# Patient Record
Sex: Female | Born: 1940
Health system: Southern US, Community
[De-identification: ages and names within clinical notes are randomized; demographics above are authoritative.]

## PROBLEM LIST (undated history)

## (undated) DIAGNOSIS — I10 Essential (primary) hypertension: Secondary | ICD-10-CM

## (undated) DIAGNOSIS — R0602 Shortness of breath: Secondary | ICD-10-CM

## (undated) DIAGNOSIS — H3530 Unspecified macular degeneration: Secondary | ICD-10-CM

## (undated) DIAGNOSIS — C3490 Malignant neoplasm of unspecified part of unspecified bronchus or lung: Secondary | ICD-10-CM

## (undated) DIAGNOSIS — E785 Hyperlipidemia, unspecified: Secondary | ICD-10-CM

## (undated) DIAGNOSIS — I639 Cerebral infarction, unspecified: Secondary | ICD-10-CM

## (undated) DIAGNOSIS — J449 Chronic obstructive pulmonary disease, unspecified: Secondary | ICD-10-CM

## (undated) DIAGNOSIS — I2510 Atherosclerotic heart disease of native coronary artery without angina pectoris: Secondary | ICD-10-CM

## (undated) DIAGNOSIS — J189 Pneumonia, unspecified organism: Secondary | ICD-10-CM

## (undated) DIAGNOSIS — R918 Other nonspecific abnormal finding of lung field: Secondary | ICD-10-CM

## (undated) DIAGNOSIS — Z87891 Personal history of nicotine dependence: Secondary | ICD-10-CM

## (undated) HISTORY — DX: Other nonspecific abnormal finding of lung field: R91.8

## (undated) HISTORY — PX: BREAST CYST EXCISION: SHX579

## (undated) HISTORY — DX: Essential (primary) hypertension: I10

## (undated) HISTORY — PX: ABDOMINAL HYSTERECTOMY: SHX81

## (undated) HISTORY — DX: Malignant neoplasm of unspecified part of unspecified bronchus or lung: C34.90

## (undated) HISTORY — PX: TONSILLECTOMY AND ADENOIDECTOMY: SUR1326

## (undated) HISTORY — PX: CATARACT EXTRACTION: SUR2

## (undated) HISTORY — PX: CHOLECYSTECTOMY: SHX55

## (undated) HISTORY — DX: Cerebral infarction, unspecified: I63.9

## (undated) HISTORY — DX: Personal history of nicotine dependence: Z87.891

## (undated) HISTORY — PX: VESICOVAGINAL FISTULA CLOSURE W/ TAH: SUR271

## (undated) HISTORY — PX: BACK SURGERY: SHX140

## (undated) HISTORY — DX: Hyperlipidemia, unspecified: E78.5

---

## 2004-05-07 HISTORY — PX: CARDIAC CATHETERIZATION: SHX172

## 2004-05-14 ENCOUNTER — Ambulatory Visit: Payer: Self-pay | Admitting: Internal Medicine

## 2004-05-19 ENCOUNTER — Ambulatory Visit: Payer: Self-pay | Admitting: Cardiovascular Disease

## 2004-05-19 ENCOUNTER — Inpatient Hospital Stay (HOSPITAL_COMMUNITY): Admission: EM | Admit: 2004-05-19 | Discharge: 2004-05-21 | Payer: Self-pay | Admitting: Emergency Medicine

## 2004-06-12 ENCOUNTER — Ambulatory Visit: Payer: Self-pay | Admitting: Internal Medicine

## 2004-06-16 ENCOUNTER — Ambulatory Visit: Payer: Self-pay | Admitting: Family Medicine

## 2004-06-18 ENCOUNTER — Ambulatory Visit: Payer: Self-pay | Admitting: Internal Medicine

## 2006-01-21 ENCOUNTER — Ambulatory Visit (HOSPITAL_COMMUNITY): Admission: RE | Admit: 2006-01-21 | Discharge: 2006-01-21 | Payer: Self-pay | Admitting: Internal Medicine

## 2006-01-21 ENCOUNTER — Ambulatory Visit: Payer: Self-pay | Admitting: Internal Medicine

## 2006-01-27 ENCOUNTER — Ambulatory Visit: Payer: Self-pay | Admitting: *Deleted

## 2006-03-09 ENCOUNTER — Ambulatory Visit: Payer: Self-pay | Admitting: Family Medicine

## 2006-03-16 ENCOUNTER — Ambulatory Visit: Payer: Self-pay | Admitting: Internal Medicine

## 2006-04-25 ENCOUNTER — Emergency Department (HOSPITAL_COMMUNITY): Admission: EM | Admit: 2006-04-25 | Discharge: 2006-04-25 | Payer: Self-pay | Admitting: Emergency Medicine

## 2006-05-03 ENCOUNTER — Ambulatory Visit: Payer: Self-pay | Admitting: Internal Medicine

## 2006-05-20 ENCOUNTER — Ambulatory Visit: Payer: Self-pay | Admitting: Family Medicine

## 2006-06-22 ENCOUNTER — Ambulatory Visit: Payer: Self-pay | Admitting: Internal Medicine

## 2006-11-05 ENCOUNTER — Ambulatory Visit: Payer: Self-pay | Admitting: Internal Medicine

## 2006-12-22 DIAGNOSIS — I10 Essential (primary) hypertension: Secondary | ICD-10-CM | POA: Insufficient documentation

## 2007-01-10 DIAGNOSIS — I6990 Unspecified sequelae of unspecified cerebrovascular disease: Secondary | ICD-10-CM | POA: Insufficient documentation

## 2007-01-20 DIAGNOSIS — J449 Chronic obstructive pulmonary disease, unspecified: Secondary | ICD-10-CM | POA: Insufficient documentation

## 2007-09-05 ENCOUNTER — Emergency Department (HOSPITAL_COMMUNITY): Admission: EM | Admit: 2007-09-05 | Discharge: 2007-09-05 | Payer: Self-pay | Admitting: Emergency Medicine

## 2007-11-25 ENCOUNTER — Encounter: Admission: RE | Admit: 2007-11-25 | Discharge: 2008-01-16 | Payer: Self-pay | Admitting: Internal Medicine

## 2008-01-23 ENCOUNTER — Ambulatory Visit: Payer: Self-pay | Admitting: Internal Medicine

## 2008-02-01 ENCOUNTER — Ambulatory Visit: Payer: Self-pay | Admitting: Internal Medicine

## 2008-02-08 ENCOUNTER — Ambulatory Visit: Payer: Self-pay

## 2008-03-01 ENCOUNTER — Emergency Department: Payer: Self-pay | Admitting: Unknown Physician Specialty

## 2008-05-02 ENCOUNTER — Ambulatory Visit: Payer: Self-pay | Admitting: Internal Medicine

## 2008-05-04 ENCOUNTER — Encounter: Payer: Self-pay | Admitting: Family Medicine

## 2008-05-10 ENCOUNTER — Encounter: Payer: Self-pay | Admitting: Family Medicine

## 2008-08-02 ENCOUNTER — Encounter: Payer: Self-pay | Admitting: Internal Medicine

## 2008-08-02 ENCOUNTER — Ambulatory Visit: Payer: Self-pay | Admitting: Cardiology

## 2008-08-06 LAB — CONVERTED CEMR LAB
ALT: 10 units/L (ref 0–35)
AST: 10 units/L (ref 0–37)
Albumin: 4.2 g/dL (ref 3.5–5.2)
Alkaline Phosphatase: 77 units/L (ref 39–117)
BUN: 14 mg/dL (ref 6–23)
CO2: 22 meq/L (ref 19–32)
Calcium: 9.1 mg/dL (ref 8.4–10.5)
Chloride: 105 meq/L (ref 96–112)
Cholesterol: 131 mg/dL (ref 0–200)
Creatinine, Ser: 0.85 mg/dL (ref 0.40–1.20)
Glucose, Bld: 135 mg/dL — ABNORMAL HIGH (ref 70–99)
HDL: 43 mg/dL (ref >39)
LDL Cholesterol: 61 mg/dL (ref 0–99)
Potassium: 4.4 meq/L (ref 3.5–5.3)
Sodium: 140 meq/L (ref 135–145)
Total Bilirubin: 0.3 mg/dL (ref 0.3–1.2)
Total CHOL/HDL Ratio: 3
Total Protein: 6.7 g/dL (ref 6.0–8.3)
Triglycerides: 133 mg/dL (ref <150)
VLDL: 27 mg/dL (ref 0–40)

## 2008-09-10 ENCOUNTER — Ambulatory Visit: Payer: Self-pay | Admitting: Family Medicine

## 2009-01-18 ENCOUNTER — Ambulatory Visit: Payer: Self-pay | Admitting: Internal Medicine

## 2009-01-18 DIAGNOSIS — I739 Peripheral vascular disease, unspecified: Secondary | ICD-10-CM | POA: Insufficient documentation

## 2009-01-18 DIAGNOSIS — E785 Hyperlipidemia, unspecified: Secondary | ICD-10-CM

## 2009-01-18 DIAGNOSIS — F17200 Nicotine dependence, unspecified, uncomplicated: Secondary | ICD-10-CM | POA: Insufficient documentation

## 2009-01-18 DIAGNOSIS — M79609 Pain in unspecified limb: Secondary | ICD-10-CM | POA: Insufficient documentation

## 2009-01-18 DIAGNOSIS — I10 Essential (primary) hypertension: Secondary | ICD-10-CM | POA: Insufficient documentation

## 2009-01-21 ENCOUNTER — Encounter: Payer: Self-pay | Admitting: Internal Medicine

## 2009-01-23 ENCOUNTER — Encounter: Payer: Self-pay | Admitting: Internal Medicine

## 2009-04-01 DIAGNOSIS — E785 Hyperlipidemia, unspecified: Secondary | ICD-10-CM | POA: Insufficient documentation

## 2009-04-01 DIAGNOSIS — R5381 Other malaise: Secondary | ICD-10-CM | POA: Insufficient documentation

## 2009-07-22 ENCOUNTER — Ambulatory Visit: Payer: Self-pay | Admitting: Internal Medicine

## 2009-07-22 DIAGNOSIS — R079 Chest pain, unspecified: Secondary | ICD-10-CM | POA: Insufficient documentation

## 2009-07-23 ENCOUNTER — Encounter: Payer: Self-pay | Admitting: Internal Medicine

## 2009-08-06 ENCOUNTER — Telehealth (INDEPENDENT_AMBULATORY_CARE_PROVIDER_SITE_OTHER): Payer: Self-pay | Admitting: *Deleted

## 2009-08-07 ENCOUNTER — Ambulatory Visit: Payer: Self-pay | Admitting: Cardiovascular Disease

## 2009-08-07 ENCOUNTER — Encounter (HOSPITAL_COMMUNITY): Admission: RE | Admit: 2009-08-07 | Discharge: 2009-10-04 | Payer: Self-pay | Admitting: Internal Medicine

## 2009-08-07 ENCOUNTER — Ambulatory Visit: Payer: Self-pay

## 2009-08-12 ENCOUNTER — Telehealth: Payer: Self-pay | Admitting: Internal Medicine

## 2009-12-31 ENCOUNTER — Ambulatory Visit: Payer: Self-pay | Admitting: Family Medicine

## 2010-02-04 ENCOUNTER — Encounter: Payer: Self-pay | Admitting: Internal Medicine

## 2010-02-13 ENCOUNTER — Ambulatory Visit: Payer: Self-pay | Admitting: Internal Medicine

## 2010-02-14 ENCOUNTER — Ambulatory Visit: Payer: Self-pay | Admitting: Pulmonary Disease

## 2010-02-14 DIAGNOSIS — R05 Cough: Secondary | ICD-10-CM

## 2010-02-14 DIAGNOSIS — R059 Cough, unspecified: Secondary | ICD-10-CM | POA: Insufficient documentation

## 2010-03-07 ENCOUNTER — Ambulatory Visit: Payer: Self-pay | Admitting: Pulmonary Disease

## 2010-03-07 DIAGNOSIS — J439 Emphysema, unspecified: Secondary | ICD-10-CM | POA: Insufficient documentation

## 2010-05-06 NOTE — Progress Notes (Signed)
Summary: Nuclear Pre-Procedure  Phone Note Outgoing Call   Call placed by: Perrin Maltese, EMT-P,  Aug 06, 2009 2:45 PM Summary of Call: Reviewed information on Myoview Information Sheet (see scanned document for further details).  Patsy spoke with the patient on 08/01/09.     Nuclear Med Background Indications for Stress Test: Evaluation for Ischemia   History: COPD, Heart Catheterization  History Comments: '06 Heart Cath N/O CAD EF 60%  Symptoms: Chest Pain, Dizziness, DOE, Fatigue    Nuclear Pre-Procedure Cardiac Risk Factors: CVA, Family History - CAD, Hypertension, Lipids, NIDDM, Smoker Height (in): 72  Nuclear Med Study Referring MD:  D.Bensimhon

## 2010-05-06 NOTE — Assessment & Plan Note (Signed)
Summary: F6M/AMD   Visit Type:  Follow-up Primary Provider:  Stacey Drain, MD  CC:  c/o shortness of breath and occas. chest pain.Marland Kitchen  History of Present Illness: [name] [name] is a very pleasant 70 year old woman with a history of chest pain with minimal nonobstructive coronary artery disease by catheterization on May 08, 2004, COPD with ongoing tobacco use, stroke, hypertension, diabetes, CVA and hyperlipidemia. She returns today for routine followup.   At last visit was having chest pressure so ordered Myoview. EF 67% no ischemia or infarct.  For past 3 months has been having severe cough. Dr. Rosanna Randy treating with several rounds of prednisone. Still smoking but down to a pack per week. No f/c. +wheezing. Occasional chest pain with coughing. No PND.    Current Medications (verified): 1)  Lisinopril-Hydrochlorothiazide 20-25 Mg Tabs (Lisinopril-Hydrochlorothiazide) .Marland Kitchen.. 1 Tab By Mouth Daily 2)  Klor-Con M20 20 Meq Cr-Tabs (Potassium Chloride Crys Cr) .Marland Kitchen.. 1 Tab By Mouth Daily 3)  Amlodipine Besylate 10 Mg Tabs (Amlodipine Besylate) .... Take One Tablet By Mouth Daily 4)  Aggrenox 25-200 Mg Xr12h-Cap (Aspirin-Dipyridamole) .... Take One Capsule By Mouth Twice A Day 5)  Metformin Hcl 1000 Mg Tabs (Metformin Hcl) .Marland Kitchen.. 1 Tab By Mouth Daily 6)  Proair Hfa 108 (90 Base) Mcg/act Aers (Albuterol Sulfate) .Marland Kitchen.. 1 Puff Every 3 Hours As Needed 7)  Aspirin Ec 325 Mg Tbec (Aspirin) .... Take One Tablet By Mouth Daily 8)  Vitamin D (Ergocalciferol) 50000 Unit Caps (Ergocalciferol) .Marland Kitchen.. 1 Tab Weekly 9)  Preservision/lutein  Caps (Multiple Vitamins-Minerals) .Marland Kitchen.. 1 Tab By Mouth Twice Daily 10)  Pravastatin Sodium 40 Mg Tabs (Pravastatin Sodium) .... One Tablet At Bedtime 11)  Prednisone Taper .... Six Tablets Daily For Four Days and Then Taper  Allergies (verified): No Known Drug Allergies  Past History:  Past Medical History: Last updated: 02-10-2009 MINIMAL NONOBSTRUCTIVE CAD: CATH ON FEB  2006 TOBACCO USAGE RECENT STROKE  HYPERTENSION DIABETES HYPERLIPIDEMIA: FOLLOWED BY DR Rosanna Randy COPD  Past Surgical History: Last updated: 02/10/09 GALLBLADDER HYSTERECTOMY BACK SURGERY  Family History: Last updated: 02/10/09 MOTHER: DECEASED 75; MASSIVE STROKE FATHER: DECEASED 65; MASSIVE HEART ATTACK BROTHER: HEART ATTACK BROTHER: HEART ATTACK   Social History: Last updated: 02-10-2009 Retired  Married  Tobacco Use - Yes.  Alcohol Use - no Regular Exercise - yes Drug Use - no  Risk Factors: Exercise: yes (Feb 10, 2009)  Risk Factors: Smoking Status: current (2009-02-10)  Vital Signs:  Patient profile:   70 year old female Height:      63 inches Weight:      154.25 pounds BMI:     27.42 Pulse rate:   75 / minute BP sitting:   124 / 70  (left arm) Cuff size:   regular  Vitals Entered By: Dolores Lory, CMA (February 13, 2010 11:09 AM)  Physical Exam  General:  Elderly. Severe hacking cough.  Hall walk sats 97% to 94% HEENT: normal Neck: supple. no JVD. Carotids 2+ bilat; no bruits. No lymphadenopathy or thryomegaly appreciated. Cor: PMI nondisplaced. Regular rate & rhythm. No rubs, murmur. +s4 Lungs: decreased airmovement. coarse. severe cough. no wheeze. Abdomen: soft, nontender, nondistended. No hepatosplenomegaly. No bruits or masses. Good bowel sounds. Extremities: no cyanosis, clubbing, rash, edema  Dp 1+ L nonpalpabe R Neuro: alert & orientedx3, cranial nerves grossly intact. moves all 4 extremities w/o difficulty. affect pleasant    Impression & Recommendations:  Problem # 1:  COUGH (ICD-786.2) Quite severe. Will refer to pulmonary for further evalaution and optimization of her COPD  regimen.   Problem # 2:  TOBACCO ABUSE (ICD-305.1) Counseled on need to stop smoking.   Problem # 3:  HYPERTENSION, BENIGN (ICD-401.1) Blood pressure well controlled. Continue current regimen.  Problem # 4:  CHEST TIGHTNESS-PRESSURE-OTHER  [id number]) Resolved. Myoview reassuring.   Other Orders: EKG w/ Interpretation (93000)  Patient Instructions: 1)  You have been referred to Dr Danton Sewer at [facility] [place], [place], [place] across from [facility].  Appointment made for 02/14/10 at 11:30, please arrive 99mins early to register.  2)  Your physician recommends that you schedule a follow-up appointment in: 6 months

## 2010-05-06 NOTE — Miscellaneous (Signed)
Summary: Orders Update pft charges  Clinical Lists Changes  Orders: Added new Service order of Carbon Monoxide diffusing w/capacity (94720) - Signed Added new Service order of Lung Volumes (94240) - Signed Added new Service order of Spirometry (Pre & Post) (94060) - Signed 

## 2010-05-06 NOTE — Assessment & Plan Note (Signed)
Summary: Cardiology Nuclear Study  Nuclear Med Background Indications for Stress Test: Evaluation for Ischemia   History: COPD, GXT, Heart Catheterization  History Comments: >15 yrs ago GXT:OK per patient; '06Cath: n/o CAD, EF= 60%  Symptoms: Chest Pain, Diaphoresis, Dizziness, DOE, Fatigue  Symptoms Comments: CP>left arm. Last episode of [id number] week.   Nuclear Pre-Procedure Cardiac Risk Factors: CVA, Family History - CAD, Hypertension, Lipids, NIDDM, Smoker Caffeine/Decaff Intake: None NPO After: 6:00 PM Lungs: Clear.  O2 Sat 98% with deep breathing. IV 0.9% NS with Angio Cath: 24g     IV Site: (R) Hand IV Started by: Irven Baltimore RN Chest Size (in) 36     Cup Size B     Height (in): 63 Weight (lb): 153 BMI: 27.20 Tech Comments: Held aggrenox x 84 hours  Nuclear Med Study 1 or 2 day study:  1 day     Stress Test Type:  Carlton Adam Reading MD:  Jenkins Rouge, MD     Referring MD:  Glori Bickers, MD Resting Radionuclide:  Technetium 3m Tetrofosmin     Resting Radionuclide Dose:  11 mCi  Stress Radionuclide:  Technetium 5m Tetrofosmin     Stress Radionuclide Dose:  33 mCi   Stress Protocol   Lexiscan: 0.4 mg   Stress Test Technologist:  Valetta Fuller CMA-N     Nuclear Technologist:  Charlton Amor CNMT  Rest Procedure  Myocardial perfusion imaging was performed at rest 45 minutes following the intravenous administration of Myoview Technetium 66m Tetrofosmin.  Stress Procedure  The patient received IV Lexiscan 0.4 mg over 15-seconds.  Myoview injected at 30-seconds.  There were no significant changes with lexiscan.  She did c/o chest pressure.  She also had slight difficultly breathing in recovery with minimal inspiratory wheezes; albuterol inhaler used with relief.  Quantitative spect images were obtained after a 45 minute delay.  QPS Raw Data Images:  Normal; no motion artifact; normal heart/lung ratio. Stress Images:  NI: Uniform and normal uptake of tracer in all  myocardial segments. Rest Images:  Normal homogeneous uptake in all areas of the myocardium. Subtraction (SDS):  Normal Transient Ischemic Dilatation:  1.12  (Normal <1.22)  Lung/Heart Ratio:  .31  (Normal <0.45)  Quantitative Gated Spect Images QGS EDV:  68 ml QGS ESV:  23 ml QGS EF:  67 % QGS cine images:  normal  Findings Normal nuclear study      Overall Impression  Exercise Capacity: Lexiscan BP Response: Normal blood pressure response. Clinical Symptoms: Headache ECG Impression: No significant ST segment change suggestive of ischemia. Overall Impression: Normal stress nuclear study. Overall Impression Comments: Mild breast attenuation  Appended Document: Cardiology Nuclear Study ok  Appended Document: Cardiology Nuclear Study Spoke with pt, advised normal stress nuclear study.  EWJ

## 2010-05-06 NOTE — Assessment & Plan Note (Signed)
Summary: F6M/AMD   Primary Provider:  Stacey Drain, MD  CC:  Chest discomfort, dizziness, and & SOB 2 days ago.  History of Present Illness: [name] [name] is a very pleasant 70 year old woman with a history of chest pain with minimal nonobstructive coronary artery disease by catheterization on May 08, 2004, COPD with ongoing tobacco use, stroke, hypertension, diabetes, and hyperlipidemia. She returns today for routine followup.   Doing fairly well. Continues to smoke 2-5 cigs per day and have some exertional SOB. No HF symptoms. Did have 1 episode of CP while standing at sink up L arm into chest lasted 5-10 mins last week. after the CP felt wiped out all night. Has not recurred. Is taking all meds. Occasional dizziness. No syncope or presyncope. Has recovered well from her CVA.   New Orders:     1)  Nuclear Stress Test (Nuc Stress Test)   Current Medications (verified): 1)  Lisinopril-Hydrochlorothiazide 20-25 Mg Tabs (Lisinopril-Hydrochlorothiazide) .Marland Kitchen.. 1 Tab By Mouth Daily 2)  Crestor 20 Mg Tabs (Rosuvastatin Calcium) .... Take One Tablet By Mouth Daily. 3)  Klor-Con M20 20 Meq Cr-Tabs (Potassium Chloride Crys Cr) .Marland Kitchen.. 1 Tab By Mouth Daily 4)  Amlodipine Besylate 10 Mg Tabs (Amlodipine Besylate) .... Take One Tablet By Mouth Daily 5)  Aggrenox 25-200 Mg Xr12h-Cap (Aspirin-Dipyridamole) .... Take One Capsule By Mouth Twice A Day 6)  Metformin Hcl 1000 Mg Tabs (Metformin Hcl) .Marland Kitchen.. 1 Tab By Mouth Daily 7)  Proair Hfa 108 (90 Base) Mcg/act Aers (Albuterol Sulfate) .Marland Kitchen.. 1 Puff Every 3 Hours As Needed 8)  Aspirin Ec 325 Mg Tbec (Aspirin) .... Take One Tablet By Mouth Daily 9)  Vitamin D (Ergocalciferol) 50000 Unit Caps (Ergocalciferol) .Marland Kitchen.. 1 Tab Weekly 10)  Preservision/lutein  Caps (Multiple Vitamins-Minerals) .Marland Kitchen.. 1 Tab By Mouth Twice Daily  Allergies (verified): No Known Drug Allergies  Past History:  Past Medical History: Last updated: 01/18/2009 MINIMAL NONOBSTRUCTIVE CAD: CATH  ON FEB 2006 TOBACCO USAGE RECENT STROKE  HYPERTENSION DIABETES HYPERLIPIDEMIA: FOLLOWED BY DR Rosanna Randy COPD  Review of Systems       As per HPI and past medical history; otherwise all systems negative.   Vital Signs:  Patient profile:   70 year old female Height:      63 inches Weight:      155 pounds BMI:     27.56 Pulse rate:   69 / minute BP sitting:   142 / 74  (left arm)  Vitals Entered By: Eliezer Lofts, EMT-P (July 22, 2009 10:36 AM)  Physical Exam  General:  Elderly. No acure distress. no resp difficulty HEENT: normal Neck: supple. no JVD. Carotids 2+ bilat; no bruits. No lymphadenopathy or thryomegaly appreciated. Cor: PMI nondisplaced. Regular rate & rhythm. No rubs, murmur. +s4 Lungs: clear with decreased airmovement Abdomen: soft, nontender, nondistended. No hepatosplenomegaly. No bruits or masses. Good bowel sounds. Extremities: no cyanosis, clubbing, rash, edema  Dp 1+ L nonpalpabe R Neuro: alert & orientedx3, cranial nerves grossly intact. moves all 4 extremities w/o difficulty. affect pleasant    Impression & Recommendations:  Problem # 1:  CHEST TIGHTNESS-PRESSURE-OTHER [id number]) Given RFs will plan Lexiscan Myoview to evaluate. (doubt she can reach target HR on treadmill so will do pharmaceutical stress).   Problem # 2:  TOBACCO ABUSE (ICD-305.1) Counseled on need for smoking cessation.   Other Orders: Nuclear Stress Test (Nuc Stress Test)

## 2010-05-06 NOTE — Progress Notes (Signed)
Summary: stress test results  Phone Note Call from Patient Call back at Home Phone [phone]   Caller: Patient Reason for Call: Lab or Test Results Summary of Call: request results of stress test Initial call taken by: Darnell Level,  Aug 12, 2009 11:25 AM  Follow-up for Phone Call        Called spoke with pt.  Results given see Nuclear append in EMR. Follow-up by: Freddrick March RN,  Aug 12, 2009 2:41 PM

## 2010-05-06 NOTE — Assessment & Plan Note (Signed)
Summary: consult for chronic cough   Visit Type:  Initial Consult Copy to:  Glori Bickers MD Primary Provider/Referring Provider:  Stacey Drain, MD  CC:  pulmonary consult. pt c/o dry cough x 11/2009. pt states she gets a hacking cough every year. Marland Kitchen  History of Present Illness: The pt is a 70y/o female who I have been asked to see for cough.  It started in Sept, with no viral URI prodrome or chest infection.  It is dry in nature, and often has cough paroxysms.  She has been on emperic abx, and now on a steroid taper.  She does not feel it has helped.  The cough worsens with prolonged conversation, and she admits to having a globus sensation in her throat.  She denies excessive throat clearing.  She denies postnasal drip, and also GERD symptoms.  She is currently on an ACE inhibitor, and also is still smoking.  She has not had a recent cxr, and has never had pfts.  Preventive Screening-Counseling & Management  Alcohol-Tobacco     Smoking Status: current     Smoking Cessation Counseling: yes     Packs/Day: 0.5     Tobacco Counseling: to quit use of tobacco products  Current Medications (verified): 1)  Lisinopril-Hydrochlorothiazide 20-25 Mg Tabs (Lisinopril-Hydrochlorothiazide) .Marland Kitchen.. 1 Tab By Mouth Daily 2)  Klor-Con M20 20 Meq Cr-Tabs (Potassium Chloride Crys Cr) .Marland Kitchen.. 1 Tab By Mouth Daily 3)  Amlodipine Besylate 10 Mg Tabs (Amlodipine Besylate) .... Take One Tablet By Mouth Daily 4)  Aggrenox 25-200 Mg Xr12h-Cap (Aspirin-Dipyridamole) .... Take One Capsule By Mouth Twice A Day 5)  Metformin Hcl 1000 Mg Tabs (Metformin Hcl) .Marland Kitchen.. 1 Tab By Mouth Daily 6)  Proair Hfa 108 (90 Base) Mcg/act Aers (Albuterol Sulfate) .Marland Kitchen.. 1 Puff Every 3 Hours As Needed 7)  Aspirin Ec 325 Mg Tbec (Aspirin) .... Take One Tablet By Mouth Daily 8)  Vitamin D (Ergocalciferol) 50000 Unit Caps (Ergocalciferol) .Marland Kitchen.. 1 Tab Weekly 9)  Preservision/lutein  Caps (Multiple Vitamins-Minerals) .Marland Kitchen.. 1 Tab By Mouth Twice  Daily 10)  Pravastatin Sodium 40 Mg Tabs (Pravastatin Sodium) .... One Tablet At Bedtime 11)  Prednisone Taper .... Six Tablets Daily For Four Days and Then Taper  Allergies (verified): No Known Drug Allergies  Past History:  Past Medical History: MINIMAL NONOBSTRUCTIVE CAD: CATH ON FEB 2006 RECENT STROKE  HYPERTENSION DIABETES HYPERLIPIDEMIA: FOLLOWED BY DR Rosanna Randy  Past Surgical History: Reviewed history from 01/18/2009 and no changes required. GALLBLADDER HYSTERECTOMY BACK SURGERY  Family History: Reviewed history from 01/18/2009 and no changes required. MOTHER: DECEASED 75; MASSIVE STROKE FATHER: DECEASED 65; MASSIVE HEART ATTACK BROTHER: HEART ATTACK BROTHER: HEART ATTACK  lung cancer--mgf MI--pgm  Social History: Reviewed history from 01/18/2009 and no changes required. Retired  [employer] Married  Tobacco Use - Yes.  Alcohol Use - no Regular Exercise - yes Drug Use - no children--4 Patient is a current smoker. 2 packs per week. started age 32.  Packs/Day:  0.5  Review of Systems       The patient complains of shortness of breath with activity, shortness of breath at rest, non-productive cough, and nasal congestion/difficulty breathing through nose.  The patient denies productive cough, coughing up blood, chest pain, irregular heartbeats, acid heartburn, indigestion, loss of appetite, weight change, abdominal pain, difficulty swallowing, sore throat, tooth/dental problems, headaches, sneezing, itching, ear ache, anxiety, depression, hand/feet swelling, joint stiffness or pain, rash, change in color of mucus, and fever.    Vital Signs:  Patient  profile:   70 year old female Height:      63 inches Weight:      155.13 pounds O2 Sat:      97 % on Room air Temp:     97.9 degrees F oral Pulse rate:   67 / minute BP sitting:   136 / 80  (left arm) Cuff size:   regular  Vitals Entered By: Charma Igo (February 14, 2010 11:44 AM)  O2 Flow:  Room air CC:  pulmonary consult. pt c/o dry cough x 11/2009. pt states she gets a hacking cough every year.  Comments meds and allergies updated Phone number updated Charma Igo  February 14, 2010 11:44 AM    Physical Exam  General:  wd female in nad Eyes:  PERRLA and EOMI.   Nose:  patent without discharge, no purulence seen Mouth:  no erythema or exudates Neck:  no jvd, tmg, LN Lungs:  clear to auscultation, no wheezing or rhonchi Heart:  rrr, no mrg Abdomen:  soft and nontender, bs+ Extremities:  mild pedal edema, no cyanosis  pulses intact distally Neurologic:  alert and oriented, moves all 4.   Impression & Recommendations:  Problem # 1:  COUGH (ICD-786.2) the pt has a cough that I suspect is multifactorial.  I think this is primarily upper airway in origin, and related to her smoking, ACE, and cyclical cough mechanism.  Will check a cxr today for completeness, and will check spirometry at next visit if she is not coughing continuously.  At this point, will get her off ACE, have asked her to stop smoking, and will place her on a modified cyclical cough protocol with behavioral therapy and modest cough suppression.  Medications Added to Medication List This Visit: 1)  Tessalon Perles 100 Mg Caps (Benzonatate) .... Two by mouth every 6 hrs as needed for cough  Other Orders: Consultation Level IV [id number]) Tobacco use cessation intermediate 3-10 minutes [id number]) Pulmonary Referral (Pulmonary) T-2 View CXR (71020TC)  Patient Instructions: 1)  stop lisinopril, and start diovan 160/25mg  one each am. 2)  stop smoking  3)  try and limit voice use as much as possible, and no throat clearing 4)  use hard candy (no peppermint or menthol) to bathe the back of the throat.  Keep in your mouth all day during waking hours. 5)  tessalon pearls 100mg  2 pearls up to every 6 hrs if needed. 6)  will check cxr today, and will call you with the results 7)  followup with me in 3 weeks, and will check breathing  tests the same day. Prescriptions: TESSALON PERLES 100 MG  CAPS (BENZONATATE) two by mouth every 6 hrs as needed for cough  #30 x 1   Entered and Authorized by:   Kathee Delton MD   Signed by:   Kathee Delton MD on 02/14/2010   Method used:   Print then Give to Patient   RxID:   [id number]      Immunization History:  Influenza Immunization History:    Influenza:  historical (12/05/2009)  Pneumovax Immunization History:    Pneumovax:  historical (01/05/2008)

## 2010-05-08 NOTE — Assessment & Plan Note (Signed)
Summary: rov for copd   Visit Type:  Follow-up Copy to:  Glori Bickers MD Primary Provider/Referring Provider:  Stacey Drain, MD  CC:  pt here to pft results. pt currently has a cold. pt c/o dry cough, wheezing, and sob when coughing. pt states she has had 1 cig on Monday. Marland Kitchen  History of Present Illness: The pt comes in today for f/u of her cough and to review pfts.  At the last visit, a lot of her cough was felt to be upper airway in origin, and possibly related to ACE.  Her lisinopril was d/ced, and the pt was asked to stop smoking.  She tells me her cough totally resolved, but now has developed a "chest cold".  She is not congested or bringing up purulence.  Her pfts today show moderate obstruction, definite airtrapping, and a normal DLCO.  I have reviewed the study with her in detail, and answered all of her questions.  Current Medications (verified): 1)  Diovan Hct 160-25 Mg Tabs (Valsartan-Hydrochlorothiazide) .... Once Daily 2)  Klor-Con M20 20 Meq Cr-Tabs (Potassium Chloride Crys Cr) .Marland Kitchen.. 1 Tab By Mouth Daily 3)  Amlodipine Besylate 10 Mg Tabs (Amlodipine Besylate) .... Take One Tablet By Mouth Daily 4)  Aggrenox 25-200 Mg Xr12h-Cap (Aspirin-Dipyridamole) .... Take One Capsule By Mouth Twice A Day 5)  Metformin Hcl 1000 Mg Tabs (Metformin Hcl) .Marland Kitchen.. 1 Tab By Mouth Daily 6)  Proair Hfa 108 (90 Base) Mcg/act Aers (Albuterol Sulfate) .Marland Kitchen.. 1 Puff Every 3 Hours As Needed 7)  Aspirin Ec 325 Mg Tbec (Aspirin) .... Take One Tablet By Mouth Daily 8)  Preservision/lutein  Caps (Multiple Vitamins-Minerals) .Marland Kitchen.. 1 Tab By Mouth Twice Daily 9)  Pravastatin Sodium 40 Mg Tabs (Pravastatin Sodium) .... One Tablet At Bedtime 10)  Tessalon Perles 100 Mg  Caps (Benzonatate) .... Two By Mouth Every 6 Hrs As Needed For Cough  Allergies (verified): No Known Drug Allergies  Past History:  Past medical, surgical, family and social histories (including risk factors) reviewed, and no changes noted (except  as noted below).  Past Medical History: Reviewed history from 02/14/2010 and no changes required. MINIMAL NONOBSTRUCTIVE CAD: CATH ON FEB 2006 RECENT STROKE  HYPERTENSION DIABETES HYPERLIPIDEMIA: FOLLOWED BY DR Rosanna Randy  Past Surgical History: Reviewed history from 01/18/2009 and no changes required. GALLBLADDER HYSTERECTOMY BACK SURGERY  Family History: Reviewed history from 02/14/2010 and no changes required. MOTHER: DECEASED 75; MASSIVE STROKE FATHER: DECEASED 65; MASSIVE HEART ATTACK BROTHER: HEART ATTACK BROTHER: HEART ATTACK  lung cancer--mgf MI--pgm  Social History: Reviewed history from 02/14/2010 and no changes required. Retired  [employer] Married  Tobacco Use - Yes.  Alcohol Use - no Regular Exercise - yes Drug Use - no children--4 Patient is a current smoker. 2 packs per week. started age 89.   Review of Systems       The patient complains of shortness of breath with activity, shortness of breath at rest, non-productive cough, loss of appetite, weight change, sore throat, nasal congestion/difficulty breathing through nose, and sneezing.  The patient denies coughing up blood, chest pain, irregular heartbeats, acid heartburn, indigestion, abdominal pain, difficulty swallowing, tooth/dental problems, headaches, itching, ear ache, anxiety, depression, hand/feet swelling, rash, change in color of mucus, and fever.    Vital Signs:  Patient profile:   70 year old female Height:      63 inches Weight:      152 pounds BMI:     27.02 O2 Sat:      95 %  on Room air Temp:     97.6 degrees F oral Pulse rate:   89 / minute BP sitting:   98 / 58  (left arm) Cuff size:   regular  Vitals Entered By: Charma Igo (March 07, 2010 10:47 AM)  O2 Flow:  Room air CC: pt here to pft results. pt currently has a cold. pt c/o dry cough, wheezing, sob when coughing. pt states she has had 1 cig on Monday.  Comments meds and allergies updated Phone number updated Charma Igo  March 07, 2010 10:47 AM    Physical Exam  General:  ow female in nad Nose:  no purulence or drainage noted Mouth:  no exudates or erythema Lungs:  decreased bs , but no wheezing Heart:  rrr Extremities:  no edema or cyanosis Neurologic:  alert and oriented, moves all 4.   Impression & Recommendations:  Problem # 1:  COPD ([id number]) the pt has moderate airflow obstruction on her pfts, but it is unclear how much of this is reversible with smoking cessation and resolution of her "cold".  Will go ahead and start her on a bronchodilator regimen, but also stressed to her the importance of stopping smoking.  Problem # 2:  COUGH (ICD-786.2) Her cough totally resolved with d/c ACE, but now she has a URI/chest cold.  I have discussed with her a quick prednisone taper to help her thru this, but she is concerned about driving up her blood sugars.  She would like to avoid.  Will see if the ICS can take care of this.  I also stressed to her the role of smoking with her cough.  Medications Added to Medication List This Visit: 1)  Diovan Hct 160-25 Mg Tabs (Valsartan-hydrochlorothiazide) .... Once daily 2)  Symbicort 160-4.5 Mcg/act Aero (Budesonide-formoterol fumarate) .... Two puffs twice daily  Other Orders: Est. Patient Level IV [id number]  Patient Instructions: 1)  will try on symbicort 160/4.5  2 inhalations am and pm everyday...rinse mouth well 2)  albuterol inhaler ( proair) 2 puffs up to every 6hrs if needed for emergencies. 3)  stay off lisinopril, and discuss with your primary if diovan is ok 4)  let me know if your chest cold doesn't clear up 5)  followup with me in 3 mos.  Prescriptions: SYMBICORT 160-4.5 MCG/ACT  AERO (BUDESONIDE-FORMOTEROL FUMARATE) Two puffs twice daily  #1 x 6   Entered and Authorized by:   Kathee Delton MD   Signed by:   Kathee Delton MD on 03/07/2010   Method used:   Print then Give to Patient   RxID:   [id number]

## 2010-06-05 ENCOUNTER — Encounter: Payer: Self-pay | Admitting: Pulmonary Disease

## 2010-06-05 ENCOUNTER — Ambulatory Visit (INDEPENDENT_AMBULATORY_CARE_PROVIDER_SITE_OTHER): Payer: Medicare Other | Admitting: Pulmonary Disease

## 2010-06-05 DIAGNOSIS — J449 Chronic obstructive pulmonary disease, unspecified: Secondary | ICD-10-CM

## 2010-06-05 DIAGNOSIS — R059 Cough, unspecified: Secondary | ICD-10-CM

## 2010-06-05 DIAGNOSIS — R05 Cough: Secondary | ICD-10-CM

## 2010-06-12 NOTE — Assessment & Plan Note (Signed)
Summary: rov for copd   Visit Type:  Follow-up Copy to:  Glori Bickers MD Primary Provider/Referring Provider:  Stacey Drain, MD  CC:  3 mo COPD follow-up.  Pt says her breathing has improved on Symbicort.  She c/o a dry cough.  Would like to switch Diovan to a cheaper medication.Marland Kitchen  History of Present Illness: the pt comes in today for f/u of her known copd.  She is taking symbicort compliantly, and feels that her breathing is much improved.  She has cut back on her smoking, but I have stressed again the need to completely quit.  Her cough is better off ACE, but she still has some likely due to her smoking.  She has questions about her BP regimen, and I have stressed to her the need to get with her primary md to go over this with her.    Preventive Screening-Counseling & Management  Alcohol-Tobacco     Smoking Status: current     Smoking Cessation Counseling: yes     Packs/Day: 0.5     Tobacco Counseling: to quit use of tobacco products  Current Medications (verified): 1)  Diovan Hct 160-25 Mg Tabs (Valsartan-Hydrochlorothiazide) .... Once Daily 2)  Klor-Con 10 10 Meq Cr-Tabs (Potassium Chloride) .Marland Kitchen.. 1 By Mouth Three Times A Day 3)  Amlodipine Besylate 10 Mg Tabs (Amlodipine Besylate) .... Take One Tablet By Mouth Daily 4)  Aggrenox 25-200 Mg Xr12h-Cap (Aspirin-Dipyridamole) .... Take One Capsule By Mouth Twice A Day 5)  Metformin Hcl 1000 Mg Tabs (Metformin Hcl) .Marland Kitchen.. 1 Tab By Mouth Daily 6)  Proair Hfa 108 (90 Base) Mcg/act Aers (Albuterol Sulfate) .Marland Kitchen.. 1 Puff Every 3 Hours As Needed 7)  Aspirin Ec 325 Mg Tbec (Aspirin) .... Take One Tablet By Mouth Daily 8)  Preservision/lutein  Caps (Multiple Vitamins-Minerals) .Marland Kitchen.. 1 Tab By Mouth Twice Daily 9)  Pravastatin Sodium 40 Mg Tabs (Pravastatin Sodium) .... One Tablet At Bedtime 10)  Tessalon Perles 100 Mg  Caps (Benzonatate) .... Two By Mouth Every 6 Hrs As Needed For Cough 11)  Symbicort 160-4.5 Mcg/act  Aero (Budesonide-Formoterol  Fumarate) .... Two Puffs Twice Daily 12)  Gabapentin 600 Mg Tabs (Gabapentin) .Marland Kitchen.. 1 By Mouth Daily 13)  Mag-Oxide 400 Mg Tabs (Magnesium Oxide) .... 2 By Mouth Daily 14)  Onglyza 5 Mg Tabs (Saxagliptin Hcl) .Marland Kitchen.. 1 By Mouth Daily 15)  Diazepam 5 Mg Tabs (Diazepam) .Marland Kitchen.. 1 By Mouth Daily As Needed 16)  Oxycodone-Acetaminophen 5-325 Mg Tabs (Oxycodone-Acetaminophen) .Marland Kitchen.. 1 By Mouth Every 4-6 Hours As Needed For Pain  Allergies (verified): No Known Drug Allergies  Review of Systems       The patient complains of non-productive cough, hand/feet swelling, and joint stiffness or pain.  The patient denies shortness of breath with activity, shortness of breath at rest, productive cough, coughing up blood, chest pain, irregular heartbeats, acid heartburn, indigestion, loss of appetite, weight change, abdominal pain, difficulty swallowing, sore throat, tooth/dental problems, headaches, nasal congestion/difficulty breathing through nose, sneezing, itching, ear ache, anxiety, depression, rash, change in color of mucus, and fever.    Vital Signs:  Patient profile:   70 year old female Height:      63 inches (160.02 cm) Weight:      160 pounds (72.73 kg) BMI:     28.45 O2 Sat:      96 % on Room air Temp:     97.9 degrees F (36.61 degrees C) oral Pulse rate:   73 / minute BP sitting:  96 / 62  (left arm) Cuff size:   regular  Vitals Entered By: Francesca Jewett CMA (June 05, 2010 9:18 AM)  O2 Sat at Rest %:  96 O2 Flow:  Room air CC: 3 mo COPD follow-up.  Pt says her breathing has improved on Symbicort.  She c/o a dry cough.  Would like to switch Diovan to a cheaper medication. Comments Medications reviewed with patient Francesca Jewett CMA  June 05, 2010 9:28 AM   Physical Exam  General:  wd female in nad  Lungs:  mildly decreased bs, no wheezing or rhonchi  Heart:  rrr Extremities:  minimal ankle edema, no cyanosis  Neurologic:  alert and oriented, moves all 4    Impression &  Recommendations:  Problem # 1:  COPD (ICD-496) the pt feels that she has done much better since being on symbicort and off ACE.  I have told her that she would do even better if she will quit smoking.  She is to stay on her current bronchodilator regimen, and to also work on some type of conditioning program.    Medications Added to Medication List This Visit: 1)  Klor-con 10 10 Meq Cr-tabs (Potassium chloride) .Marland Kitchen.. 1 by mouth three times a day 2)  Gabapentin 600 Mg Tabs (Gabapentin) .Marland Kitchen.. 1 by mouth daily 3)  Mag-oxide 400 Mg Tabs (Magnesium oxide) .... 2 by mouth daily 4)  Onglyza 5 Mg Tabs (Saxagliptin hcl) .Marland Kitchen.. 1 by mouth daily 5)  Diazepam 5 Mg Tabs (Diazepam) .Marland Kitchen.. 1 by mouth daily as needed 6)  Oxycodone-acetaminophen 5-325 Mg Tabs (Oxycodone-acetaminophen) .Marland Kitchen.. 1 by mouth every 4-6 hours as needed for pain  Other Orders: Est. Patient Level III [id number]) Tobacco use cessation intermediate 3-10 minutes [id number])  Patient Instructions: 1)  stay on symbicort as you are doing, with proair for rescue if needed 2)  stop smoking 3)  followup with me in 59mos

## 2010-08-19 NOTE — Assessment & Plan Note (Signed)
[name] OFFICE NOTE   NAME:[name] [name]                         MRN:          [id number]  DATE:11/05/2006                            DOB:          06-13-1940    INTERVAL HISTORY:  [name] [name] is a very pleasant 70 year old woman who  returns today for routine followup.   PROBLEM LIST:  1. History of non cardiac chest pain with minimal coronary artery      disease by catheterization in February 2006.  2. COPD with ongoing tobacco use.  3. Hypertension.  4. New onset diabetes with a hemoglobin A1C of 8.1 in March of 2008.  5. Hyperlipidemia, most recent cholesterol panel with a total      cholesterol of 174, triglycerides 146, HDL 43, and LDL 102.   CURRENT MEDICATIONS:  1. Lisinopril/hydrochlorothiazide 20/25.  2. Potassium 20 a day.  3. Aspirin 325 a day.  4. Multivitamin.  5. Norvasc 10.  6. Clonidine 0.1 b.i.d.  7. Lovastatin 120 mg a day.   [name] [name] says she is doing very well. She is very happy since she got  her Toy Poodle. She says she has been checking her blood pressures  regularly, and they have been 120/70, occasionally higher but this is  rare. She does have very occasional shortness of breath but not as much  as before. She denies any lower extremity edema. No orthopnea or PND.  Unfortunately she continues to smoke.   PHYSICAL EXAMINATION:  She is in no acute distress, ambulates around the  clinic without any respiratory difficultly. Blood pressure is 140/72,  heart rate is 63.  HEENT: Normal with some scattered xanthelasma.  NECK: Supple. No JVD. Carotids are 2+ bilaterally without bruits. There  is no lymphadenopathy or thyromegaly.  CARDIAC: She has a regular rate and rhythm. No murmurs, rubs, or  gallops. PMI is non displaced.  LUNGS: Clear with a mildly prolonged expiratory phase. No wheezes.  ABDOMEN: Soft, nontender, nondistended. No hepatosplenomegaly. No  bruits. No masses. Good  bowel sounds.  EXTREMITIES: Warm with no cyanosis, clubbing, or edema.  NEURO: Alert and oriented x3. Cranial nerves II-XII are intact. Moves  all 4 extremities without difficultly. Affect is pleasant.   ASSESSMENT/PLAN:  1. Hypertension, blood pressure is mildly elevated today but it has      been better at home. We will continue to follow, if necessary we      can increase her clonidine.  2. Hyperlipidemia, she was supposed to be on 80 of lovastatin and      instead is taking 120. We will cut her back to 80. Given her      diabetes her goal LDL now is 70 instead of 100. She may need Zetia.  3. Chronic obstructive pulmonary disease with ongoing tobacco use.      Once again reminded her the need to quit smoking.  4. Diabetes, we will need to get her hooked up with a Glucometer and      diabetic supplies. She is going to follow up with Dr. Stacey Drain  to help with this.   DISPOSITION:  We will see her back in several months for routine follow  up. As above we are referring her to Dr. Stacey Drain for help with many  of her primary care issues.     Shaune Pascal. Bensimhon, MD  Electronically Signed    DRB/MedQ  DD: 11/05/2006  DT: 11/05/2006  Job #: [id number]   cc:   Miguel Aschoff

## 2010-08-19 NOTE — Assessment & Plan Note (Signed)
[facility] OFFICE NOTE   NAME:[name] [name], [name] [name]                         MRN:          [id number]  DATE:05/02/2008                            DOB:          18-Jul-1940    PRIMARY CARE PHYSICIAN:  Dr. Miguel Aschoff.   INTERVAL HISTORY:  [name] [name] is a very pleasant 70 year old woman with a  history of chest pain with minimal nonobstructive coronary artery  disease by catheterization on May 08, 2004, COPD with ongoing  tobacco use, recent stroke, hypertension, diabetes, and hyperlipidemia.  She returns today for routine followup.   Unfortunately, since we last saw her, she tells me that she had a clot  to one of her eye vessels with some surrounding breakage of other  vessels.  She is now only able to see a black spot out of her right eye.  She has been seen by Ophthalmology here and they had been giving her  shots into her eye.  I did discussed possible surgery, but I felt like  this may be high risk.  From a cardiac point-of-view, she has remained  stable.  She continues to have just very occasional chest pain.  She  does also have mild dyspnea on exertion related to her COPD.  She has  not had any orthopnea, no PND, no lower extremity edema.  Her leg pain  has gotten much better.  She had ABIs, which were normal, showed no  evidence of significant obstructive peripheral arterial disease.   CURRENT MEDICATIONS:  1. Aggrenox 200/25 b.i.d.  2. Metformin 1000 a day.  3. Potassium 20 a day.  4. Crestor 20 a day.  5. Lisinopril/HCTZ 20/25 a day.  6. Amlodipine 10 a day.  7. Aspirin 325 a day.   PHYSICAL EXAMINATION:  GENERAL:  She is in no acute distress.  Ambulates  around the clinic without any respiratory difficulty.  VITAL SIGNS:  Blood pressure is 110/64, heart rate is 80, weight is 146,  which is down 9 pounds.  HEENT:  Normal except for some xanthelasmas.  NECK:  Supple.  No JVD, carotids are 2+  bilaterally without bruits.  There is no lymphadenopathy or thyromegaly.  CARDIAC:  PMI is nondisplaced.  There is regular rate and rhythm.  No  murmurs, rubs, or gallops.  LUNGS:  Clear with a mildly prolonged expiratory phase.  No wheezing.  ABDOMEN:  Soft, nontender, nondistended.  No hepatosplenomegaly.  No  bruits.  No masses.  EXTREMITIES:  Warm with no cyanosis, clubbing, or edema.  No rash.  NEUROLOGICAL:  She is alert and oriented x3.  Her cranial nerves II  through XII are grossly intact except for problems with her vision in  her right eye.  She moves all four extremities without difficulty.   ASSESSMENT AND PLAN:  1. Hypertension, well controlled.  2. Hyperlipidemia, is followed by Dr. Rosanna Randy.  Goal LDL is less than      70.  Continue statin.  3. Tobacco use.  I once again reinforced the need for smoking      cessation.  4. Recent embolic event to her eye.  One of friends at church have      suggested a possible second opinion with the [facility]      ophthalmologist in [place].  I thought this was a very      reasonable idea.  I also broached the subject of whether or not we      should consider Coumadin with her recent stroke and now an embolic      event to her eye.  We have helped her arrange appointment with      ophtho and the [facility] ophtho doctors on Friday morning to      address these issues.   DISPOSITION:  I will see her back in 9 months for routine followup from  a cardiac perspective.     Shaune Pascal. Bensimhon, MD  Electronically Signed    DRB/MedQ  DD: 05/02/2008  DT: 05/03/2008  Job #: [id number]

## 2010-08-19 NOTE — Assessment & Plan Note (Signed)
[facility] OFFICE NOTE   NAME:[name], [name]                         MRN:          [id number]  DATE:02/01/2008                            DOB:          03-19-1941    PRIMARY CARE PHYSICIAN:  Dr.  Miguel [name].   INTERVAL HISTORY:  [name] [name] is a very pleasant 70 year old woman with a  history of chest pain with minimal coronary artery disease by  catheterization in February 2006, COPD with ongoing tobacco use, recent  stroke, hypertension, diabetes, and hyperlipidemia.  She returns today  for routine followup.   Overall, she is doing well.  She is recovering from her stroke well.  Today was her first day back to driving.  She denies any chest pain.  She continues to have mild dyspnea on exertion secondary to her COPD.  She has not had any orthopnea or PND.  No lower extremity edema.  She  does have occasional leg pain in her left knee with walking and no clear  claudication, but says sometimes her  calves to get tight.   CURRENT MEDICATIONS:  1. Aggrenox 200/25 b.i.d.  2. Metformin 1000 a day.  3. Potassium 20 a day.  4. Crestor 20 a day.  5. Lisinopril/HCTZ 20/25 a day.  6. Amlodipine 10 a day.   PHYSICAL EXAMINATION:  GENERAL:  She is in no acute distress.  Ambulates  around the clinic without any respiratory difficulty.  VITAL SIGNS:  Blood pressure is 112/60, heart rate is 72, weight is 155.  HEENT:  Normal except some scattered xanthelasmas.  NECK:  Supple.  No JVD.  Carotids are 2+ bilaterally without bruits.  There is no lymphadenopathy or thyromegaly.  CARDIAC:  PMI is nondisplaced.  Regular rate and rhythm.  No murmurs,  rubs, or gallops.  LUNGS:  Clear with mildly prolonged expiratory phase.  No wheezing.  ABDOMEN:  Soft, nontender, nondistended.  No hepatosplenomegaly.  No  bruits.  No masses.  Good bowel sounds.  EXTREMITIES:  Warm with no  cyanosis, clubbing, or edema.  DP pulses are 2+ on  the left and  nonpalpable on the right.  NEUROLOGIC:  Alert and oriented x3.  Cranial nerves II-XII grossly  intact.  Moves all 4 extremities without significant difficulty.   ASSESSMENT AND PLAN:  1. Hypertension.  This is well controlled.  2. Hyperlipidemia is followed by Dr. Rosanna [name] given her recent stroke.      Goal LDL would be less than 70.  3. Leg pain and possible claudication.  We will check an ABI of lower      extremity ultrasounds.  4. Tobacco use.  Once again I reinforced the need for smoking      cessation.     [name] Pascal. Bensimhon, MD  Electronically Signed    DRB/MedQ  DD: 02/01/2008  DT: 02/02/2008  Job #: [id number]   cc:   [name] [name]

## 2010-08-20 ENCOUNTER — Ambulatory Visit: Payer: Self-pay | Admitting: Gastroenterology

## 2010-08-22 NOTE — Assessment & Plan Note (Signed)
[facility] OFFICE NOTE   NAME:[name] [name], [name] [name]                         MRN:          [id number]  DATE:06/22/2006                            DOB:          02-28-1941    PRIMARY CARE PHYSICIAN:  Dr. Eliezer Lofts, [place].   INTERVAL HISTORY:  [name] [name] is a delightful 70 year old woman with a  history of noncardiac chest pain with a relatively normal cath in  February 2006.  She also has hypertension, COPD with ongoing tobacco  use, diabetes, and hyperlipidemia.  She returns today for routine  followup.  Overall she says she is doing quite well.  She is very  excited about the new toy poodle she just got.  She managed to quit  smoking for 3 weeks but is now back up to almost a pack a day.  She has  been very good about watching her diet and has actually lost about 5-8  pounds.  She denies any chest pain or shortness of breath.  Her blood  pressure has been much improved after the addition of clonidine at the  last visit.   CURRENT MEDICATIONS:  1. Lisinopril/HCTZ 20/25.  2. Potassium 20 a day.  3. Aspirin 325.  4. Multivitamin.  5. Norvasc 10 a day.  6. Robaxin 750 b.i.d.  7. Lovastatin 80 a day.  8. Clonidine 0.1 b.i.d.   PHYSICAL EXAMINATION:  She is in no acute distress.  Ambulates around  the clinic without any respiratory difficulty.  Blood pressure is 124/75  with a heart rate of 70, weight is 160.  HEENT:  Sclerae anicteric, EOMI, there are no xanthelasmas, mucus  membranes are moist, oropharynx is clear.  NECK:  Supple, there is no JVD, carotids are 2+ bilaterally without  bruits, there is no lymphadenopathy or thyromegaly.  CARDIAC:  She has a regular rate and rhythm, no murmurs, rubs, or  gallops.  LUNGS:  Clear with a mildly prolonged expiratory phase, no wheezes.  ABDOMEN:  Soft, nontender, nondistended, no hepatosplenomegaly, no  bruits, no masses appreciated, good bowel sounds.  EXTREMITIES:  Warm with no cyanosis, clubbing, or edema.  NEURO:  Alert and oriented x3, cranial nerves II through XII are intact,  moves all 4 extremities without difficulty.  Affect is pleasant.   ASSESSMENT/PLAN:  1. Hypertension.  Blood pressure is much improved with the addition of      clonidine.  We will continue current therapy.  2. Hyperlipidemia.  Her lovastatin was recently increased, we will      check a followup lipid panel today.  3. Ongoing tobacco use.  Once again warned her of the risks and      suggested she stop smoking.  4. Diabetes, new onset.  She will follow up with her primary care      physician for further evaluation.   DISPOSITION:  We will see her back in clinic in several months for  routine followup.  I told her that she can reduce her aspirin down to 81  mg a day.     Quillian Quince  Karlyn Agee, MD  Electronically Signed    DRB/MedQ  DD: 06/22/2006  DT: 06/22/2006  Job #: [id number]   cc:   Eliezer Lofts, MD

## 2010-08-22 NOTE — Assessment & Plan Note (Signed)
[facility] OFFICE NOTE   NAME:Vandeberg, BRITANNY                         MRN:          [id number]  DATE:05/03/2006                            DOB:          08/30/1940    May 03, 2006   PRIMARY CARE PHYSICIAN:  Eliezer Lofts, MD.   PATIENT IDENTIFICATION:  Ms. Darley is a very pleasant 70 year old woman  who returns for routine follow-up.   PROBLEM LIST:  1. Chest pain.      a.     Cardiac catheterization February 2006, mild nonobstructive       coronary disease with  normal left ventricular function.  2. Hypertension.  3. Chronic obstructive pulmonary disease with ongoing tobacco use.  4. Near syncope and episode evaluated February 2006, possible      cerebrovascular accident per neurology.  5. Hyperlipidemia, lovastatin dose recently increased.  6. Diabetes, new onset.  7. Obesity.   CURRENT MEDICATIONS:  1. Lisinopril/hydrochlorothiazide 20/25.  2. Potassium 20 a day.  3. Aspirin 325 a day.  4. Multivitamin one tablet daily.  5. Mucinex.  6. Tussionex.  7. Norvasc 10 a day.  8. Robaxin 750 b.i.d.  9. Lovastatin 80 a day.   INTERVAL HISTORY:  Ms. Boylan returns today for routine follow-up.  She  recently was seen in the emergency room with upper respiratory tract  infection.  She is now getting better from this.  She feels much better.  Unfortunately, she continues to smoke.  She denies any chest pain or  significant shortness of breath.  No orthopnea, no PND.  She does have  some arthritis pain.   PHYSICAL EXAMINATION:  GENERAL APPEARANCE:  She ambulates around the  clinic with no acute distress.  Respirations are unlabored.  VITAL SIGNS:  Blood pressure initially 138/80, on recheck 130/70, heart  rate 73.  HEENT:  Sclerae are anicteric.  EOMI.  There are scattered xanthelasma.  Moist mucous membranes.  Oropharynx is clear.  NECK:  Supple.  There is no JVD.  Carotids are 2+ bilaterally without  bruits.  There is no lymphadenopathy or thyromegaly.  LUNGS:  She has some minimal crackles in the right lung base.  There is  a prolonged expiratory phase.  No wheezes.  CARDIOVASCULAR:  Regular rate and rhythm, no murmurs, rubs, or gallops.  ABDOMEN:  Soft, nontender, nondistended, no hepatosplenomegaly, no  bruits, no masses.  Good bowel sounds.  EXTREMITIES:  Warm with no clubbing, cyanosis, or edema.  NEUROLOGIC:  She is alert and oriented x3.  Cranial nerves II-XII  intact.  Moves all four extremities without difficulty.  Affect is  pleasant.   Most recent lipid panel from December 2007, showed total cholesterol  255, triglycerides 177, HDL 45 and LDL 175.  Fasting glucose was 136,  creatinine 0.9.  Potassium 4.9.   ASSESSMENT/PLAN:  1. Hypertension.  Improved control but still not at goal.  We will      start her on clonidine 0.1 b.i.d.  There is also some room to move      on her lisinopril but  her potassium was at the upper end of normal.      However, she is also on supplemental potassium.  Given that she      needs to drop her blood pressure probably 10 or 15 more points, I      do think it is probably reasonable to start with the clonidine and      consider lisinopril increase in the future.  2. Hyperlipidemia.  Lipids are markedly elevated.  We recently      increased her Lovastatin.  She will be due for follow-up in three      months.  3. Ongoing tobacco use.  Once again, I warned her of the risks of this      and asked her to stop.  4. Diabetes, new onset.  I have asked her to follow up with her      primary care physician to likely begin metformin and also get      glucose monitoring tools and education.   DISPOSITION:  I had a long talk with Ms. Fricker about her accumulation of  risk factors and told her that she needs to be serious about trying to  make changes, otherwise she will continue to be a high risk for  cardiovascular events despite her relatively normal  coronaries on  catheterization nearly two years ago.   I will see her back in three months for routine follow-up.     Shaune Pascal. Bensimhon, MD  Electronically Signed    DRB/MedQ  DD: 05/03/2006  DT: 05/03/2006  Job #: [id number]   cc:   Eliezer Lofts, MD

## 2010-08-22 NOTE — Cardiovascular Report (Signed)
NAMEKASIE, LAUREL NO.:  [id number]   MEDICAL RECORD NO.:  [id number]          PATIENT TYPE:  INP   LOCATION:  [id number]                         FACILITY:  [facility]   PHYSICIAN:  Glori Bickers, M.D. LHCDATE OF BIRTH:  [birth year]   DATE OF PROCEDURE:  05/20/2004  DATE OF DISCHARGE:                              CARDIAC CATHETERIZATION   PATIENT IDENTIFICATION:  [name] is a 70 year old smoker with a history of  hypertension who was referred for cardiac catheterization secondary to  progressive chest pain.   DESCRIPTION OF PROCEDURE:  The risks and benefits of the procedure were  explained to [name] [name].  Consent was signed and placed on the chart.  The  right femoral area was prepped and draped in routine sterile fashion.  It  was then anesthetized with 1% lidocaine.  A 6 French arterial sheath was  placed using modified Seldinger technique.  The 5 French sheath was placed  in the femoral artery.  Standard catheters were used including a JR4, JL4,  and bent pigtail.  All catheter exchanges were made over a wire.  There was  no evidence of any complications.  At the end of the procedure, the patient  was transferred to the holding room in stable condition for removal of her  arterial access.   PROCEDURE PERFORMED:  1.  Selective coronary angiography.  2.  Left heart catheterization.  3.  Left ventriculogram.   FINDINGS:  Hemodynamics:  Central aortic pressure 124/67 with a mean of 89.  LV  pressure 94/5.  LVEDP was 11.  There was no gradient on aortic valve pull  back.   Coronary anatomy:   The left main was normal.   The LAD was a long vessel that wrapped the apex.  There were minor luminal  irregularities.  There was a small D1 with 50% ostial lesion and 40% mid  lesion.   The left circumflex gave off a large ramus intermedius and a small OM1 and  OM2.  This was angiographically normal.   The RCA was a dominant vessel which gave off a normal PDA and  several tiny  PLs.  There was a 25% ostial lesion in the RCA which worsened with some  catheter induced spasm.  There was also a 25% proximal plaque.  There were  minor luminal irregularities distally.   Left ventriculogram done in the RAO approach showed left ventricular  ejection fraction 60%.  There were no wall motion abnormalities.  There was  mild mitral regurgitation.   ASSESSMENT/PLAN:  1.  Very mild nonobstructive coronary artery disease.  2.  Proceed with aggressive risk factor management including smoking      cessation.      DB/MEDQ  D:  05/20/2004  T:  05/20/2004  Job:  [id number]

## 2010-08-22 NOTE — Discharge Summary (Signed)
NAMEDIOSELIN, EDKIN NO.:  [id number]   MEDICAL RECORD NO.:  [id number]          PATIENT TYPE:  INP   LOCATION:  [id number]                         FACILITY:  [facility]   PHYSICIAN:  Jenkins Rouge, M.D.     DATE OF BIRTH:  [birth year]   DATE OF ADMISSION:  05/19/2004  DATE OF DISCHARGE:  05/21/2004                           DISCHARGE SUMMARY - REFERRING   PROCEDURES:  1.  Cardiac catheterization on May 20, 2004.  2.  Carotid Dopplers.  3.  Magnetic resonance angiogram of brain/head.   REASON FOR ADMISSION:  [name] is a 70 year old female with no known  history of significant coronary artery disease recently referred to Dr.  Haroldine Laws for evaluation of chest discomfort and dyspnea scheduled for  outpatient cardiac catheterization who presented to the emergency room with  near syncope.  Please refer to dictated admission note for full details.   LABORATORY DATA AND X-RAY FINDINGS:  Potassium 4.0, BUN 14, creatinine 0.8,  hematocrit 39 at discharge.  CPK-MB and troponin I markers normal x3 sets.  Lipid profile with total cholesterol 210, triglycerides 185, HDL 40, LDL  133.  TSH 2.58.  Liver enzymes normal on admission.   Chest x-ray with no acute disease.  Head CT scan normal intracranially,  right sphenoid sinusitis.  MRA of head/brain with acute/early subacute  lacunar type infarction of tail of left caudate nucleus, right sphenoid  sinusitis.  Brain MRA with high-grade stenosis involving prepetrous/proximal  petrous junction of right ICA.   HOSPITAL COURSE:  The patient was admitted by Dr. Jenkins Rouge for further  evaluation of symptoms suggestive of near syncope.  Serial cardiac markers  were all within normal limits and given that the patient was scheduled for  cardiac catheterization the following day, recommendation was to proceed  with this following neurologic evaluation.   The patient was referred to Dr. Leonie Man for further evaluation.  He noted a  symptomatic orthostatic changes, but recommended followup MRI/MRA.  The CT  scan showed no acute abnormality with right sphenoid sinusitis.  MRA images,  however, revealed subacute lacunar infarct at the tail of the caudate as  well as a high-grade intracranial right ICA lesion.  Dr. Haroldine Laws reviewed  these results with radiology and will confer with Dr. Sabino Snipes  regarding therapeutic options.   Carotid Dopplers showed no significant stenosis.  The patient was cleared to  proceed with cardiac catheterization performed by Dr. Haroldine Laws (see report  for full details) revealing very mild, nonobstructive coronary artery  disease and normal left ventricular function with mild mitral regurgitation.   The patient was scheduled for overnight observation and cleared for  discharge the following morning in hemodynamically stable condition.  No  complications of the right groin incision site were noted.  New medications  of Lipitor.   Of note, at the time of discharge, Dr. Haroldine Laws noted after consultation  with neurology, recommendation was to treat the lacunar infarct with  aspirin.   DISCHARGE MEDICATIONS:  1.  Coated aspirin 325 mg q.d.  2.  Toprol XL 25 mg q.d.  3.  Lipitor 20  mg q.d.  4.  Hydrochlorothiazide 12.5 mg q.d.  5.  K-Dur 20 mEq q.d.  6.  Nitrostat 0.4 mg p.r.n.   ACTIVITY:  No heavy lifting or driving x2 days.   DIET:  Maintain low fat, low cholesterol diet.   SPECIAL INSTRUCTIONS:  Call the office if there is any swelling/bleeding of  the groin.   FOLLOW UP:  The patient will keep previously scheduled follow-up appointment  with Dr. Glori Bickers on Thursday, March 9, at 2:15 p.m.   DISCHARGE DIAGNOSES:  1.  Near syncope, subacute lacunar infarct, treated medically.  2.  Cerebrovascular disease with high-grade right internal carotid artery      intracranial stenosis.  3.  Nonobstructive coronary artery disease, normal left ventricular      function.   Cardiac catheterization on May 20, 2004.  4.  Mild mitral regurgitation.  5.  Dyslipidemia.  6.  History of hypertension.  7.  Status post transient ischemic attacks in 2000.  8.  Tobacco use.      GS/MEDQ  D:  05/21/2004  T:  05/21/2004  Job:  [id number]   cc:   Pramod P. Leonie Man, MD  Fax: [phone]

## 2010-08-22 NOTE — H&P (Signed)
NAMECENDY, SAGEL NO.:  [id number]   MEDICAL RECORD NO.:  [id number]          PATIENT TYPE:  EMS   LOCATION:  MAJO                         FACILITY:  [facility]   PHYSICIAN:  Jenkins Rouge, M.D.     DATE OF BIRTH:  Feb 14, 1941   DATE OF ADMISSION:  05/19/2004  DATE OF DISCHARGE:                                HISTORY & PHYSICAL   SUMMARY OF HISTORY:  [name] is a 70 year old white female who was brought  to the emergency room with a near syncopal episode.  While sitting waiting  for her fiancee, who was in cardiac rehabilitation, she was talking and  stood up to leave; however, very suddenly felt woozy/dizzy, and complained  of a bad headache in the occipital region.  She also noted some sharp left  arm pain.  She immediately sat down with some slight improvement; however,  within a few minutes, she stood back up, and the symptoms worsened.  She  stated that she actually started to fall, but she did not lose  consciousness, and bystanders assisted her to the floor.  When she tried to  sit up, her symptoms worsened.  Her blood pressure is 160/90s, according to  her fiancee.  She was brought to the emergency room on a stretcher for  further evaluation.  She denies prior occurrences; however, later she states  that it is similar to a TIA that she experienced in 2000, for which she was  evaluated in [place].   She initially saw Dr. Haroldine Laws in the office on May 14, 2004 for chest  discomfort and shortness of breath, and it is noted that the dictation is  pending at this time.  She states that since her office visit, she has  continued to have episodes of chest discomfort.  She describes these as  sometimes sharp, sometimes as pressure, sometimes in her mid-sternum,  sometimes under her left breast, sometimes in her axilla.  After seeing Dr.  Haroldine Laws, she is scheduled for an outpatient catheterization in the [facility] lab  for May 20, 2004.  In the emergency room,  she states that she is not  dizzy or woozy, but she has not tried to sit back up; however, she is  complaining of a excruciating left occipital headache.   PAST MEDICAL HISTORY:   ALLERGIES:  No known drug allergies.   MEDICATIONS PRIOR TO ADMISSION:  1.  Toprol-XL 25 mg daily.  2.  Aspirin 325 daily.  3.  Nitro-Dur patch, unknown dosage daily.  4.  Nitroglycerin 0.4 p.r.n.  5.  HCTZ, unknown dose.  6.  KCl, unknown dose.   HISTORY:  1.  Hypertension, recently diagnosed.  Her blood pressure runs in the 130s-      160s/70s to 90s at home.  2.  TIA with outpatient evaluation in 2000.  3.  Reportedly a catheterization approximately 10 years ago in [place];      results not available.  4.  History of cholecystectomy.  5.  Back surgery.  6.  Hysterectomy.   SOCIAL HISTORY:  She resides in [place] with her  fiancee.  She is a  retired Insurance claims handler.  She continues to smoke one pack per day, and has  been doing so for approximately 50 years.  She denies any alcohol, drugs,  herbal medications, and maintained a low-fat, salt, and low-sugar diet.   FAMILY HISTORY:  Her father died in his mid-11s with a myocardial  infarction.  Her mother in her 67s with a CVA.  She has 2 brothers, age 42  and 57; both have had heart attacks.   REVIEW OF SYSTEMS:  In addition to the above, notable for dentures, glasses,  dry skin, coughing, wheezing, menopausal, occasional mood swings, and cold  intolerance.   PHYSICAL EXAMINATION:  GENERAL:  Well-nourished, well-developed, pleasant  white female in no apparent distress.  VITAL SIGNS:  Temperature is 98.1, blood pressure 126/66, pulse 66 and  regular, respirations 24, 98% saturation on 2 liters.  HEENT:  Unremarkable, with equal pupils.  NECK:  Supple without thyromegaly, adenopathy, JVD, or carotid bruits.  HEART:  Regular rate and rhythm.  Normal S1 and S2.  Did not appreciate any  murmurs, rubs, clicks, or gallops.  Peripheral pulses are  symmetrical and  intact.  LUNGS:  Lung sounds are diminished but clear.  She does have some upper  airway expiratory pseudowheezing.  SKIN:  Integument was intact.  EXTREMITIES:  Unremarkable.  NEUROLOGIC:  Unremarkable.   LABORATORIES AND DIAGNOSTIC TESTS:  Chest x-ray, EKG, and labs are pending  at the time of this dictation.   IMPRESSION:  1.  Near syncope, rule out CVA.  2.  Hypertension.  3.  Atypical chest discomfort.  4.  Tobacco use.  History as previously.   DISPOSITION:  Dr. Johnsie Cancel reviewed the patient's history, spoke with, and  examined the patient.  We will obtain a non-contrast CT, and use some  Demerol for her pain.  We will obtain carotid Dopplers in the morning.  Dr.  Leonie Man has been asked to consult from a neurological perspective.  I have  cancelled the [facility] cath, and have rescheduled her catheterization for an  inpatient on Wednesday.  We will continue her home medications.      EW/MEDQ  D:  05/19/2004  T:  05/19/2004  Job:  [id number]   cc:   Glori Bickers, M.D. [facility]

## 2010-08-22 NOTE — Assessment & Plan Note (Signed)
[facility]                              CARDIOLOGY OFFICE NOTE   NAME:Brigante, REGEINA                         MRN:          [id number]  DATE:01/21/2006                            DOB:          [birth year]    PATIENT IDENTIFICATION:  Ms. Bengston is a very pleasant 70 year old woman who  returns today for routine followup.   PROBLEM LIST:  1. Chest pain.      a.     Cardiac catheterization showed mild nonobstructive disease with       normal LV function.  2. Hypertension.  3. COPD with ongoing tobacco use.  4. Near syncope in February, 2006.      a.     Evaluated by Dr. Leonie Man of neurology.  MRI/MRA showed subacute       lacunar infarct at the tail of the caudate as well as a high grade       intracranial right internal carotid artery stenosis.  Carotid Dopplers       were normal.  5. Hyperlipidemia.  6. Obesity.   CURRENT MEDICATIONS:  1. Lisinopril/HCTZ 20/25.  2. Potassium 20 a day.  3. Aspirin 325 a day.  4. Multivitamin.   INTERVAL HISTORY:  Ms. Stott returns today for routine followup.  She has  been struggling with an upper respiratory infection for the past week.  She  has had some low-grade fevers.  No chills.  She has had a cough but no  productive sputum.  No dysuria.  Plus sinus congestion.  She also notices  that her blood pressure has been running quite high recently in the 170/90  range.  She has been compliant with her medications.  She has not been  taking decongestants.  She denies any chest pain.  Unfortunately, she is  still smoking one pack of cigarettes a day.   PHYSICAL EXAMINATION:  GENERAL: She is congested and looks fatigued.  VITAL SIGNS:  Blood pressure 168/94, heart rate 66.  Weight is 163.  She is  able to ambulate around the clinic without any respiratory distress.  HEENT:  Sclerae are anicteric.  EOMI.  NECK:  Supple.  There is no JVD.  Carotids are 2+ bilaterally without any  bruits.  There is no lymphadenopathy  or thyromegaly.  CARDIAC:  Regular rate and rhythm.  No murmurs, rubs or gallops.  LUNGS:  Clear with mildly decreased air movement throughout.  ABDOMEN:  Obese, nontender, nondistended.  There are no bruits.  EXTREMITIES:  Warm with no clubbing or cyanosis.  NEURO:  Alert and oriented x3.  Cranial nerves II-XII are intact.  She moves  all four extremities without difficulty.  Affect is bright.   EKG shows sinus rhythm at a rate of 66.  No ST-T wave changes.   ASSESSMENT/PLAN:  1. Hypotension:  This remains poorly controlled.  We will add Norvasc 5 mg      a day.  I suspect we will have to continue to titrate.  2. Upper respiratory tract infection:  She does not have any evidence of  bronchial constriction or wheezing.  Thus, I do not think she needs to      be treated for a chronic obstructive pulmonary disease flare at this      time. We will get a chest x-ray to make sure there is no infiltrate.  I      told her that if she does not get better within the next week, she will      need to contact me to consider antibiotics.  3. Hyperlipidemia:  Will recheck her cholesterol today and consider      therapy as needed.  4. Tobacco:  Once again, I reminded her of the importance of quitting.       Shaune Pascal. Bensimhon, MD     DRB/MedQ  DD:  01/21/2006  DT:  01/23/2006  Job #:  [id number]

## 2010-08-22 NOTE — Assessment & Plan Note (Signed)
[facility] OFFICE NOTE   NAME:[name], [name]                         MRN:          [id number]  DATE:03/16/2006                            DOB:          1940/12/26    PATIENT IDENTIFICATION:  [name] is a very pleasant 70 year old woman  who returns today for followup on her blood pressure.   PROBLEM LIST:  1. Chest pain.      a.     Cardiac catheterization showed mild non-obstructive disease       with normal LV function in February of 2006.  2. Hypertension.  3. COPD with ongoing tobacco use.  4. Near syncope in February of 2006.      a.     Evaluated by neurology.  5. Hyperlipidemia, recently started on lovastatin.  6. Obesity.   CURRENT MEDICATIONS:  1. Lisinopril HCTZ 20/25.  2. Potassium 20 a day.  3. Aspirin 325 a day.  4. Multivitamin.  5. Lovastatin 40.  6. Norvasc 5 a day.  7. Mucinex 1200.  8. Tussionex cough medicine p.r.n.   INTERVAL HISTORY:  [name] returns today for routine followup.  She is  doing fairly well.  She is finally getting over a long upper respiratory  tract infection she had for about 2 months.  She had been following her  blood pressure.  It is somewhat improved, but it is labile.  She will  have pressures in the 130s to 140s, and often have systolics in the  123456, as well.  She has been tolerating all her medications.  She does  her best to watch her salt intake.   PHYSICAL EXAMINATION:  GENERAL:  She ambulates in the clinic in no acute  distress.  Respirations are unlabored.  VITAL SIGNS:  Blood pressure is 146/76 manually with a heart rate of 69.  Her weight is 168, which is up 10 pounds over the year.  HEENT:  Sclerae are anicteric.  Extraocular movements are intact.  There  are scattered xanthelasmas.  Mucous membranes are moist.  Oropharynx is  clear.  NECK:  Supple with no JVD.  Carotids are 2+ bilaterally without bruits.  There is no lymphadenopathy or  thyromegaly.  CARDIAC:  Regular rate and rhythm.  No murmurs, rubs, or gallops.  LUNGS:  Clear with a prolonged expiratory phase.  ABDOMEN:  Soft, nontender, nondistended.  No hepatosplenomegaly, no  bruits, no masses.  Good bowel sounds.  EXTREMITIES:  Warm with no clubbing, cyanosis, or edema.  NEUROLOGIC:  Alert and oriented x3.  Cranial nerves II-XII are intact.  Moves all 4 extremities without difficulty.   ASSESSMENT AND PLAN:  1. Hypertension.  Blood pressure is somewhat better controlled by      still elevated.  Increase Norvasc to 10 a day.  Will see her back      in a month for continued titration.  I suspect we will need to add      either clonidine or Spironolactone.  2. Hyperlipidemia. Will recheck lipids and a CMET today to see where  she stands.  I have given her risk factors.  I would like to get      her LDL below 100.   DISPOSITION:  Return to clinic in 4 weeks.     Shaune Pascal. Bensimhon, MD  Electronically Signed    DRB/MedQ  DD: 03/16/2006  DT: 03/16/2006  Job #: [id number]   cc:   Eliezer Lofts, MD

## 2010-08-22 NOTE — Consult Note (Signed)
NAMEAILEENE, KEMLER                ACCOUNT NO.:  [id number]   MEDICAL RECORD NO.:  [id number]          PATIENT TYPE:  INP   LOCATION:  [id number]                         FACILITY:  [facility]   PHYSICIAN:  Pramod P. Leonie Man, MD    DATE OF BIRTH:  [birth year]   DATE OF CONSULTATION:  05/19/2004  DATE OF DISCHARGE:                                   CONSULTATION   REFERRING PHYSICIAN:  Jenkins Rouge, M.D.   REASON FOR CONSULTATION:  Dizziness.   HISTORY OF PRESENT ILLNESS:  Ms. Leanord Asal is a 70 year old Caucasian female who  developed dizziness and near passing out earlier today.  The patient was at  her fiance's doctor's appointment this morning when she had been sitting for  about 10 minutes. When she attempted to sit up she felt woozy, off balance  and fell back.  When she got up again for the second time, she immediately  fell to the ground without loosing consciousness.  There was no involuntary  tonic clonic movements.  She denied any complaints, symptoms of chest pain,  diaphoresis, nausea, vomiting, palpitations.  She complained of occipital  headache which has persisted for  several hours but seems to be easing up  now after she has been admitted.  She denied any focal extremity weakness,  numbness.  There is no double vision or loss of vision.  She has no prior  history or documented stroke though she says in 55 in [place] she episode  of possible TIA.  She describes posterior occipital headache as well as  dizziness and off balance sensation which lasted several minutes.  She was  seen by her primary doctor but never had a neurological evaluation or brain  imaging studies done.  Her past medical history was quite benign except  remote history of migraine which are not active.  No history of seizures.   FAMILY HISTORY:  Significant for stroke in father.   PAST MEDICAL HISTORY:  1.  Hypertension.  2.  Smoking.   HOME MEDICATIONS:  Toprol XL 25 mg a day and hydrochlorothiazide both of  which have been recently started five days ago.  She also takes aspirin,  Nitro-Dur patch  potassium.   SOCIAL HISTORY:  She lives in [place] with her fiance.  She smokes one  pack per day for 50 years.  She used to work as a Games developer.   REVIEW OF SYSTEMS:  Significant for some chest pain, dizziness, or  wooziness.   PHYSICAL EXAMINATION:  GENERAL:  A pleasant Caucasian middle aged lady not  in distress.  VITAL SIGNS:  Afebrile.  Pulse rate 66 beats per minute and blood pressure  126/66.  The supine position it drops to 102/52 and pulse rate slows down to  54 in the standing position and the patient is symptomatic complains of  wooziness and dizziness when she stands.   EXTREMITIES:  Distal pulses are well felt.  HEENT:  Head is not traumatic.  ENT exam is unremarkable.  NECK:  Supple without bruit.  CARDIAC:  No murmurs, rubs or gallops.  LUNGS:  Clear to auscultation.  ABDOMEN:  Soft, nontender.  NEUROLOGIC:  Patient is awake, alert, oriented times three with normal  speech and language function. There is no aphasia or dysarthria.  Pupils  equally reactive.  Eye movements are full range without nystagmus.  Face is  symmetric.  Palatal movements are normal.  Tongue is midline. Motor system  exam reveals symmetric upper and lower extremity strength, tone, reflexes,  coordination, sensation.  Patient is unsteady when she stands.  Complaints  of wooziness.  On exam she has no nystagmus at that time.   LABORATORY DATA:  Admission labs reveal normal electrolytes, hematocrit.  EKG reveals normal sinus rhythm without acute ischemic findings.   IMPRESSION:  70 year old lady with symptoms of transient dizziness likely  secondary to orthostasis due to combination of blood pressure lowering  effect of newly started antihypertensive as well as some hypovolemia.  Vertebrobasilar TIA is a possibility, given her risk factors of  hypertension, smoking and previous TIA but less  likely.   PLAN:  Recommend MRI scan of the brain with MRA of the brain and neck.  Agree with carotid ultrasound.  Orthostatic tolerance exercises.  I will be  happy to follow the patient on consults.      PPS/MEDQ  D:  05/19/2004  T:  05/20/2004  Job:  [id number]

## 2010-08-25 ENCOUNTER — Ambulatory Visit: Payer: Self-pay | Admitting: Gastroenterology

## 2010-09-17 ENCOUNTER — Encounter: Payer: Self-pay | Admitting: Internal Medicine

## 2010-09-24 ENCOUNTER — Ambulatory Visit (INDEPENDENT_AMBULATORY_CARE_PROVIDER_SITE_OTHER): Payer: Medicare Other | Admitting: Internal Medicine

## 2010-09-24 ENCOUNTER — Encounter: Payer: Self-pay | Admitting: Internal Medicine

## 2010-09-24 VITALS — BP 130/75 | HR 69 | Ht 64.0 in | Wt 163.8 lb

## 2010-09-24 DIAGNOSIS — R0602 Shortness of breath: Secondary | ICD-10-CM

## 2010-09-24 DIAGNOSIS — I6350 Cerebral infarction due to unspecified occlusion or stenosis of unspecified cerebral artery: Secondary | ICD-10-CM

## 2010-09-24 DIAGNOSIS — I639 Cerebral infarction, unspecified: Secondary | ICD-10-CM

## 2010-09-24 DIAGNOSIS — I2510 Atherosclerotic heart disease of native coronary artery without angina pectoris: Secondary | ICD-10-CM

## 2010-09-24 DIAGNOSIS — R079 Chest pain, unspecified: Secondary | ICD-10-CM

## 2010-09-24 DIAGNOSIS — I10 Essential (primary) hypertension: Secondary | ICD-10-CM

## 2010-09-24 DIAGNOSIS — E781 Pure hyperglyceridemia: Secondary | ICD-10-CM

## 2010-09-24 NOTE — Progress Notes (Signed)
HPI:  [name] [name] is a very pleasant 70 year old woman with a history of chest pain with minimal nonobstructive coronary artery disease by catheterization on May 08, 2004, COPD with ongoing tobacco use, stroke, hypertension, diabetes, CVA and hyperlipidemia. She returns today for routine followup.  In 2011 was having chest pressure so ordered Myoview. EF 67% no ischemia or infarct.  She recently saw Dr. Gwenette Greet for cough. He switched from ACE-I to ARB and put on her inhalers. Cough much better. Unfortunately had another stroke in April and has given her difficulty swallowing. Since that time feels like she chokes a lot. Swallowing study apparently was ok. Has been thickening liquid.   Still smoking but down to a pack per week. Called [place] Quit line. Couldn't afford patches.  f/c. +wheezing. Occasional chest pain with coughing. No PND. Having problems affording meds.   ROS: All systems negative except as listed in HPI, PMH and Problem List.  Past Medical History  Diagnosis Date  . CAD (coronary artery disease)     Minimal nonobstructive, cath 05/2004  . Stroke     Recent  . Hypertension   . Diabetes mellitus   . Hyperlipemia     Followed by Dr. Rosanna Randy    Current Outpatient Prescriptions  Medication Sig Dispense Refill  . albuterol (PROAIR HFA) 108 (90 BASE) MCG/ACT inhaler Inhale 1 puff into the lungs every 3 (three) hours as needed.        Marland Kitchen aspirin 325 MG EC tablet Take 325 mg by mouth daily.        . budesonide-formoterol (SYMBICORT) 160-4.5 MCG/ACT inhaler Inhale 2 puffs into the lungs 2 (two) times daily.        . diazepam (VALIUM) 5 MG tablet Take 5 mg by mouth daily as needed.        . dipyridamole-aspirin (AGGRENOX) 25-200 MG per 12 hr capsule Take 1 capsule by mouth 2 (two) times daily.        Marland Kitchen gabapentin (NEURONTIN) 600 MG tablet Take 600 mg by mouth daily.        Marland Kitchen losartan-hydrochlorothiazide (HYZAAR) 100-25 MG per tablet Take 1 tablet by mouth daily.        . magnesium  oxide (MAG-OX) 400 MG tablet Take 2 tablets by mouth daily.        . metFORMIN (GLUCOPHAGE) 1000 MG tablet Take 1,000 mg by mouth daily.        . Multiple Vitamins-Minerals (PRESERVISION/LUTEIN) CAPS Take 1 capsule by mouth 2 (two) times daily.        Marland Kitchen omeprazole (PRILOSEC) 20 MG capsule Take 20 mg by mouth daily.        Marland Kitchen oxyCODONE-acetaminophen (PERCOCET) 5-325 MG per tablet Take 1 tablet by mouth every 4 (four) hours as needed.        . potassium chloride (KLOR-CON) 10 MEQ CR tablet Take 10 mEq by mouth 3 (three) times daily.        . pravastatin (PRAVACHOL) 40 MG tablet Take 40 mg by mouth at bedtime.        . saxagliptin HCl (ONGLYZA) 5 MG TABS tablet Take 5 mg by mouth daily.        . valsartan-hydrochlorothiazide (DIOVAN-HCT) 320-12.5 MG per tablet Take 1 tablet by mouth daily.        Marland Kitchen DISCONTD: amLODipine (NORVASC) 10 MG tablet Take 10 mg by mouth daily.        Marland Kitchen DISCONTD: benzonatate (TESSALON) 100 MG capsule Take 200 mg by mouth every 6 (six)  hours as needed.        Marland Kitchen DISCONTD: valsartan-hydrochlorothiazide (DIOVAN-HCT) 160-25 MG per tablet Take 1 tablet by mouth daily.           PHYSICAL EXAM: Filed Vitals:   09/24/10 1155  BP: 130/75  Pulse: 69    Elderly  + cough. fatigued. No resp difficulty HEENT: normal Neck: supple. JVP flat. Carotids 2+ bilaterally; no bruits. No lymphadenopathy or thryomegaly appreciated. Cor: PMI normal. Regular rate & rhythm. No rubs, gallops or murmurs. Lungs: clear. diminished throughout Abdomen: soft, nontender, nondistended. No hepatosplenomegaly. No bruits or masses. Good bowel sounds. Extremities: no cyanosis, clubbing, rash, edema Neuro: alert & orientedx3, cranial nerves grossly intact. Moves all 4 extremities w/o difficulty. Affect pleasant.    ECG: NSR 69 No ST-T wave abnormalities.     ASSESSMENT & PLAN:

## 2010-09-24 NOTE — Patient Instructions (Signed)
Your physician recommends that you schedule a follow-up appointment in: 9 months

## 2010-09-28 DIAGNOSIS — R0602 Shortness of breath: Secondary | ICD-10-CM | POA: Insufficient documentation

## 2010-09-28 DIAGNOSIS — I639 Cerebral infarction, unspecified: Secondary | ICD-10-CM | POA: Insufficient documentation

## 2010-09-28 NOTE — Assessment & Plan Note (Signed)
Chronic. Predominantly due to COPD. Counseled on need for smoking cessation.

## 2010-09-28 NOTE — Assessment & Plan Note (Addendum)
Essentially resolved. Ischemic work-up negative to date. No further cardiac testing at this time.

## 2010-09-28 NOTE — Assessment & Plan Note (Signed)
Blood pressure well controlled. Continue current regimen.  

## 2010-09-28 NOTE — Assessment & Plan Note (Signed)
In sinus rhythm here with no evidence of AF on any visit but given recurrent TIA/CVA may be worth an event monitor in the future.We discussed possibility of coumadin but she is not interested at this point.

## 2010-12-10 ENCOUNTER — Ambulatory Visit (INDEPENDENT_AMBULATORY_CARE_PROVIDER_SITE_OTHER): Payer: Medicare Other | Admitting: Pulmonary Disease

## 2010-12-10 ENCOUNTER — Encounter: Payer: Self-pay | Admitting: Pulmonary Disease

## 2010-12-10 VITALS — BP 110/70 | HR 72 | Temp 98.2°F | Ht 64.0 in | Wt 163.2 lb

## 2010-12-10 DIAGNOSIS — J449 Chronic obstructive pulmonary disease, unspecified: Secondary | ICD-10-CM

## 2010-12-10 DIAGNOSIS — Z23 Encounter for immunization: Secondary | ICD-10-CM

## 2010-12-10 MED ORDER — BUDESONIDE-FORMOTEROL FUMARATE 160-4.5 MCG/ACT IN AERO: 2.0000 | INHALATION_SPRAY | Freq: Two times a day (BID) | RESPIRATORY_TRACT | Status: DC

## 2010-12-10 NOTE — Patient Instructions (Signed)
Continue to stay off cigarettes.  This is the key to you staying well Stay on symbicort Trial of spiriva one inhalation each am.  If you think it helps, let us know and we can call in prescription.  Will give you flu shot today. followup with me in 40mos.

## 2010-12-10 NOTE — Progress Notes (Signed)
  Subjective:    Patient ID: [name] [name], female    DOB: 1941/03/12, 70 y.o.   MRN: [id number]  HPI The patient comes in today for followup of her known COPD.  She has unfortunately continued to smoke, but states that she stopped doing so approximately 5 days ago.  She has continued on symbicort and feels that it is helping her breathing.  She denies any significant cough, congestion, or mucus at this time.  She continues to have significant dyspnea on exertion, and feels that it has worsened over the summer.  She also tells me that she has been having "mini strokes", and this has affected her swallowing.   Review of Systems  Constitutional: Positive for unexpected weight change. Negative for fever.  HENT: Positive for sneezing and trouble swallowing. Negative for ear pain, nosebleeds, congestion, sore throat, rhinorrhea, dental problem, postnasal drip and sinus pressure.   Eyes: Negative for redness and itching.  Respiratory: Positive for chest tightness and shortness of breath. Negative for cough and wheezing.   Cardiovascular: Positive for chest pain and leg swelling. Negative for palpitations.  Gastrointestinal: Negative for nausea and vomiting.  Genitourinary: Negative for dysuria.  Musculoskeletal: Negative for joint swelling.  Skin: Negative for rash.  Neurological: Negative for headaches.  Hematological: Bruises/bleeds easily.  Psychiatric/Behavioral: Negative for dysphoric mood. The patient is not nervous/anxious.        Objective:   Physical Exam Obese female in no acute distress Nose without purulence or discharge noted Chest with mildly decreased breath sounds, no wheezes or rhonchi Cardiac exam was regular rate and rhythm Lower extremities with no significant edema, no cyanosis noted Alert and oriented, moves all 4 extremities.       Assessment & Plan:

## 2010-12-10 NOTE — Assessment & Plan Note (Signed)
The patient is continuing to have dyspnea on exertion that has worsened since her last visit.  She states that she has been compliant with symbicort, but has continued to smoke since last visit.  I have again told her smoking cessation is the key to her getting better.  Her worsening shortness of breath may just be related to the high heat and humidity from the summer.  Will add Spiriva to her current regimen to see if it improves her breathing.  She will let us know.  We'll also give her a flu shot today.

## 2010-12-10 NOTE — Progress Notes (Signed)
Addended by: Drucie Opitz on: 12/10/2010 12:28 PM   Modules accepted: Orders

## 2010-12-30 ENCOUNTER — Encounter: Payer: Self-pay | Admitting: *Deleted

## 2010-12-30 ENCOUNTER — Encounter: Payer: Self-pay | Admitting: Internal Medicine

## 2010-12-30 ENCOUNTER — Ambulatory Visit (INDEPENDENT_AMBULATORY_CARE_PROVIDER_SITE_OTHER): Payer: Medicare Other | Admitting: Internal Medicine

## 2010-12-30 VITALS — BP 126/68 | HR 72 | Wt 164.0 lb

## 2010-12-30 DIAGNOSIS — R079 Chest pain, unspecified: Secondary | ICD-10-CM

## 2010-12-30 LAB — CBC WITH DIFFERENTIAL/PLATELET
Basophils Absolute: 0 10*3/uL (ref 0.0–0.1)
Eosinophils Absolute: 0 10*3/uL (ref 0.0–0.7)
HCT: 43.3 % (ref 36.0–46.0)
Lymphocytes Relative: 19.3 % (ref 12.0–46.0)
Lymphs Abs: 1.8 10*3/uL (ref 0.7–4.0)
MCV: 95.8 fl (ref 78.0–100.0)
Monocytes Relative: 6.1 % (ref 3.0–12.0)
Neutro Abs: 7 10*3/uL (ref 1.4–7.7)

## 2010-12-30 LAB — BASIC METABOLIC PANEL
Calcium: 10 mg/dL (ref 8.4–10.5)
Chloride: 100 mEq/L (ref 96–112)
Potassium: 5.6 mEq/L — ABNORMAL HIGH (ref 3.5–5.1)
Sodium: 141 mEq/L (ref 135–145)

## 2010-12-30 NOTE — Assessment & Plan Note (Addendum)
She continues to have chest pain at rest and on exertion including nocturnal symptoms. Last cath 2006. Last Myoview 2011. We discussed possibility that this may not be cardiac but rather related to her previous CVA.  She continues to smoke. However, given the severity of her symptoms and her risk factors including ongoing tobacco use I favor repeat cath. She agrees. We discussed risks of procedure including possible repeat CVA and she understands these.

## 2010-12-30 NOTE — Patient Instructions (Signed)

## 2010-12-30 NOTE — Progress Notes (Signed)
HPI:  [name] [name] is a very pleasant 70 year old woman with a history of chest pain with minimal nonobstructive coronary artery disease by catheterization on May 08, 2004, COPD with ongoing tobacco use, stroke, hypertension, diabetes, CVA and hyperlipidemia. She returns today for routine followup.  In 2011 was having chest pressure so ordered Myoview. EF 67% no ischemia or infarct.  She recently saw Dr. Gwenette Greet for cough. Dr Gwenette Greet put her on inhalers. Cough worse due to cold.   Unfortunately had another stroke in April and has given her difficulty swallowing. Since that time feels like she chokes a lot. Swallowing study apparently was ok. Has been thickening liquid.   Still smoking a pack a day.   She is here for follow up. She recently saw Dr Gwenette Greet.  Every day  chest pain Complains of L arm numbness that usually last 2 minutes.  She has chest pain at rest and during exertion. CP is not situational. Occasionally she wakes up with CP.  Dr Rosanna Randy recommended follow up today.   Having problems affording meds because she is in the dough[place].  SOB on exertion.Sleeps on pillows.  She continues to use thickened liquids. Uses a cane to ambulate due to balance problems from stroke.  Weight at home 160. SBP at home 110-120   ROS: All systems negative except as listed in HPI, PMH and Problem List.  Past Medical History  Diagnosis Date  . CAD (coronary artery disease)     Minimal nonobstructive, cath 05/2004  . Stroke     Recent  . Hypertension   . Diabetes mellitus   . Hyperlipemia     Followed by Dr. Rosanna Randy    Current Outpatient Prescriptions  Medication Sig Dispense Refill  . albuterol (PROAIR HFA) 108 (90 BASE) MCG/ACT inhaler Inhale 2 puffs into the lungs every 3 (three) hours as needed.       Marland Kitchen aspirin 325 MG EC tablet Take 325 mg by mouth daily.        . budesonide-formoterol (SYMBICORT) 160-4.5 MCG/ACT inhaler Inhale 2 puffs into the lungs 2 (two) times daily.  1 Inhaler  5  .  diazepam (VALIUM) 5 MG tablet Take 5 mg by mouth daily as needed.        . dipyridamole-aspirin (AGGRENOX) 25-200 MG per 12 hr capsule Take 1 capsule by mouth 2 (two) times daily.        Marland Kitchen gabapentin (NEURONTIN) 600 MG tablet Take 600 mg by mouth daily.        Marland Kitchen losartan (COZAAR) 100 MG tablet Take 1 tablet by mouth daily.      Marland Kitchen losartan-hydrochlorothiazide (HYZAAR) 100-25 MG per tablet Take 1 tablet by mouth daily.        . magnesium oxide (MAG-OX) 400 MG tablet Take 2 tablets by mouth daily.        . metFORMIN (GLUCOPHAGE) 1000 MG tablet Take 1,000 mg by mouth daily.        . Multiple Vitamins-Minerals (CENTRUM SILVER PO) Take 1 tablet by mouth daily.        Marland Kitchen omeprazole (PRILOSEC) 20 MG capsule Take 20 mg by mouth daily.        Marland Kitchen oxyCODONE-acetaminophen (PERCOCET) 5-325 MG per tablet Take 1 tablet by mouth every 4 (four) hours as needed.        . potassium chloride (KLOR-CON) 10 MEQ CR tablet Take 10 mEq by mouth 3 (three) times daily.        . pravastatin (PRAVACHOL) 40 MG tablet Take  40 mg by mouth at bedtime.        . saxagliptin HCl (ONGLYZA) 5 MG TABS tablet Take 5 mg by mouth daily.           PHYSICAL EXAM: Filed Vitals:   12/30/10 1223  BP: 126/68  Pulse: 72    Elderly  + cough. No resp difficulty HEENT: normal Neck: supple. JVP flat. Carotids 2+ bilaterally; no bruits. No lymphadenopathy or thryomegaly appreciated. Cor: PMI normal. Regular rate & rhythm. No rubs, gallops or murmurs. Left shoulder tender to palpation.  Lungs: clear. Abdomen: soft, nontender, nondistended. No hepatosplenomegaly. No bruits or masses. Good bowel sounds. Extremities: no cyanosis, clubbing, rash, edema Neuro: alert & orientedx3, cranial nerves grossly intact. Moves all 4 extremities w/o difficulty. Affect pleasant.    ECG:  SR 72 no significant st-t wave changes     ASSESSMENT & PLAN:

## 2010-12-31 ENCOUNTER — Inpatient Hospital Stay (HOSPITAL_BASED_OUTPATIENT_CLINIC_OR_DEPARTMENT_OTHER)
Admission: RE | Admit: 2010-12-31 | Discharge: 2010-12-31 | Disposition: A | Discharge status: Home / Self Care | Payer: Medicare Other | Source: Ambulatory Visit | Attending: Internal Medicine | Admitting: Internal Medicine

## 2010-12-31 DIAGNOSIS — I2510 Atherosclerotic heart disease of native coronary artery without angina pectoris: Secondary | ICD-10-CM

## 2010-12-31 DIAGNOSIS — J449 Chronic obstructive pulmonary disease, unspecified: Secondary | ICD-10-CM | POA: Insufficient documentation

## 2010-12-31 DIAGNOSIS — I701 Atherosclerosis of renal artery: Secondary | ICD-10-CM

## 2010-12-31 DIAGNOSIS — R0989 Other specified symptoms and signs involving the circulatory and respiratory systems: Secondary | ICD-10-CM | POA: Insufficient documentation

## 2010-12-31 DIAGNOSIS — J4489 Other specified chronic obstructive pulmonary disease: Secondary | ICD-10-CM | POA: Insufficient documentation

## 2010-12-31 DIAGNOSIS — F17200 Nicotine dependence, unspecified, uncomplicated: Secondary | ICD-10-CM | POA: Insufficient documentation

## 2010-12-31 DIAGNOSIS — R079 Chest pain, unspecified: Secondary | ICD-10-CM | POA: Insufficient documentation

## 2010-12-31 DIAGNOSIS — R0609 Other forms of dyspnea: Secondary | ICD-10-CM | POA: Insufficient documentation

## 2010-12-31 LAB — PROTIME-INR: INR: 0.86 (ref <1.50)

## 2011-01-06 ENCOUNTER — Encounter: Payer: Self-pay | Admitting: *Deleted

## 2011-01-07 ENCOUNTER — Encounter: Payer: Self-pay | Admitting: *Deleted

## 2011-01-12 ENCOUNTER — Other Ambulatory Visit (HOSPITAL_COMMUNITY): Payer: Self-pay | Admitting: Internal Medicine

## 2011-01-12 DIAGNOSIS — R079 Chest pain, unspecified: Secondary | ICD-10-CM

## 2011-01-13 ENCOUNTER — Ambulatory Visit (HOSPITAL_COMMUNITY): Payer: Medicare Other | Attending: Internal Medicine | Admitting: Radiology

## 2011-01-13 VITALS — Ht 64.0 in | Wt 162.0 lb

## 2011-01-13 DIAGNOSIS — I2510 Atherosclerotic heart disease of native coronary artery without angina pectoris: Secondary | ICD-10-CM

## 2011-01-13 DIAGNOSIS — R079 Chest pain, unspecified: Secondary | ICD-10-CM | POA: Insufficient documentation

## 2011-01-13 MED ORDER — TECHNETIUM TC 99M TETROFOSMIN IV KIT
11.0000 | PACK | Freq: Once | INTRAVENOUS | Status: AC | PRN
  Administered 2011-01-13: 11 via INTRAVENOUS

## 2011-01-13 MED ORDER — TECHNETIUM TC 99M TETROFOSMIN IV KIT
33.0000 | PACK | Freq: Once | INTRAVENOUS | Status: AC | PRN
  Administered 2011-01-13: 33 via INTRAVENOUS

## 2011-01-13 MED ORDER — REGADENOSON 0.4 MG/5ML IV SOLN
0.4000 mg | Freq: Once | INTRAVENOUS | Status: AC
  Administered 2011-01-13: 0.4 mg via INTRAVENOUS

## 2011-01-13 NOTE — Progress Notes (Signed)
[facility] [place] [address] [id number]  Cardiology Nuclear Med [name] [name] is a 70 y.o. female [id number] 1940/06/07   Nuclear Med Background Indication for Stress Test:  Evaluation for Ischemia and [facility] on 01/01/11 Cath-Assess RCA History:  COPD and '11 [id number], EF=67%; 12/31/10 Cath:N/O CAD with BL Ostial lesion in RCA, EF=60-65% Cardiac Risk Factors: CVA, Family History - CAD, Hypertension, Lipids, NIDDM, Smoker and TIA  Symptoms:  Chest Pain with and without exercise with (L) arm numbness (last episode of chest discomfort was while under the camera today, none now), Diaphoresis, DOE/SOB, Fatigue, Palpitations, Rapid HR    Nuclear Pre-Procedure Caffeine/Decaff Intake:  None NPO After: 9:00pm   Lungs:  Inspiratory wheezes.  Patient used her Proventil inhaler prior to infusion. IV 0.9% NS with Angio Cath:  22g  IV Site: L Wrist  IV Started by:  Crissie Figures, RN  Chest Size (in):  36 Cup Size: B  Height: 5\' 4"  (1.626 m)  Weight:  162 lb (73.483 kg)  BMI:  Body mass index is 27.81 kg/(m^2). Tech Comments:  Aggrenox held x 96 hours.  BS 179 this AM    Nuclear Med Study 1 or 2 day study: 1 day  Stress Test Type:  Carlton Adam  Reading MD: Kirk Ruths, MD  Order Authorizing Provider:  Glori Bickers, MD  Resting Radionuclide: Technetium 2m Tetrofosmin  Resting Radionuclide Dose: 11.0 mCi   Stress Radionuclide:  Technetium 72m Tetrofosmin  Stress Radionuclide Dose: 33.0 mCi           Stress Protocol Rest HR: 70 Stress HR: 90  Rest BP: 136/66 Stress BP: 119/56  Exercise Time (min): n/a METS: n/a   Predicted Max HR: 152 bpm % Max HR: 59.21 bpm Rate Pressure Product: 10710   Dose of Adenosine (mg):  n/a Dose of Lexiscan: 0.4 mg  Dose of Atropine (mg): n/a Dose of Dobutamine: n/a mcg/kg/min (at max HR)  Stress Test Technologist: Letta Moynahan, CMA-N  Nuclear Technologist:  Charlton Amor, CNMT      Rest Procedure:  Myocardial perfusion imaging was performed at rest 45 minutes following the intravenous administration of Technetium 50m Tetrofosmin.  Rest ECG: No acute changes, rare PVC.  Stress Procedure:  The patient received IV Lexiscan 0.4 mg over 15-seconds.  Technetium 42m Tetrofosmin injected at 30-seconds.  There were no significant changes with Lexiscan, only rare PVC's and PAC's.    Quantitative spect images were obtained after a 45 minute delay.  Stress ECG: No significant ST segment change suggestive of ischemia.  QPS Raw Data Images:  There is a breast shadow. Stress Images:  There is decreased uptake in the apex. Rest Images:  There is decreased uptake in the apex. Subtraction (SDS):  No evidence of ischemia. Transient Ischemic Dilatation (Normal <1.22):  0.80 Lung/Heart Ratio (Normal <0.45):  0.36  Quantitative Gated Spect Images QGS EDV:  51 ml QGS ESV:  12 ml QGS cine images:  NL LV Function; NL Wall Motion QGS EF: 76%  Impression Exercise Capacity:  Lexiscan with no exercise. BP Response:  Normal blood pressure response. Clinical Symptoms:  No chest pain. ECG Impression:  No significant ST segment change suggestive of ischemia. Comparison with Prior Nuclear Study: No significant change from previous study  Overall Impression:  Normal stress nuclear study with small fixed apical defect suggestive of thinning; no ischemia.    Kirk Ruths

## 2011-01-15 ENCOUNTER — Telehealth: Payer: Self-pay | Admitting: Internal Medicine

## 2011-01-15 NOTE — Telephone Encounter (Signed)
No ischemia on Myoview. Continue medical therapy.

## 2011-01-15 NOTE — Telephone Encounter (Signed)
Pt called. She wants to know about stress test and catherization

## 2011-01-15 NOTE — Telephone Encounter (Signed)
Pt calling today for stress test results.  She is aware that it has been read and will need to be reviewed by Dr. Haroldine Laws. Horton Chin RN

## 2011-01-18 NOTE — Cardiovascular Report (Signed)
NAMEYOALI, BOZE NO.:  [id number]  MEDICAL RECORD NO.:  [id number]  LOCATION:                                 FACILITY:  PHYSICIAN:  Shaune Pascal. Astou Lada, MDDATE OF BIRTH:  1940-09-30  DATE OF PROCEDURE:  12/31/2010 DATE OF DISCHARGE:                           CARDIAC CATHETERIZATION   PRIMARY CARE PHYSICIAN:  Miguel Aschoff, MD in [place].  INDICATION:  Ms. Devol is a 70 year old woman with COPD and ongoing tobacco use.  She has a history of chest pain and dyspnea.  She had a cardiac catheterization in 2006, which showed normal coronary arteries. She has continuing to have daily chest pain, it is worst.  There is no relation to exertion.  She had a Myoview year ago, which was normal. Given her ongoing symptoms and risk factors, we decided to proceed with repeat angiography.  PROCEDURES PERFORMED: 1. Selective coronary angiography. 2. Left heart catheterization. 3. Left ventriculogram. 4. Abdominal aortogram.  DESCRIPTION OF PROCEDURE:  The risks and indication were explained. Consent was signed and placed on the chart.  Right groin area was prepped and draped in routine sterile fashion, anesthetized with 1% local lidocaine.  We placed a 4-French arterial sheath using a modified Seldinger technique.  Standard catheters were used including a JL-4, 3DRC, and angled pigtail.  All catheter exchanges were made over wire. There were no apparent complications.  Central aortic pressure 122/58 with a mean of 87.  LV pressure 125/10 with an EDP of 25.  Left main was normal.  Left circumflex gave off a OM-1 and OM-2, was angiographically normal.  LAD was a long vessel that wrapped the apex.  It had mild diffuse plaquing throughout the mid and distal section.  There was no high-grade stenosis in the ostium of small OM-1.  There is about 50% stenosis.  Right coronary artery was a large dominant vessel and gave off a PDA and a posterolateral.  It had  some mild plaquing through the midsection.  In the ostium of the RCA, there was proximally a 60% lesion.  It did improve slightly with nitroglycerin.  There was no catheter damping.  Left ventriculogram was done in the RAO position shows an EF of 60-65% with no regional wall motion abnormalities.  Abdominal aortogram shows patent renal arteries bilaterally.  There appears to be some mild-to-moderate disease in the ostium of the right common iliac, I do not think this is severe.  There was no abdominal aortic aneurysm.  ASSESSMENT: 1. Nonobstructive coronary artery disease with borderline ostial     lesion in the right coronary artery. 2. Normal left ventricular function with elevated left ventricular end-     diastolic pressure.  PLAN/DISCUSSION:  I do not think the right coronary lesion is responsible for her symptoms.  However, I do think it is reasonable to proceed with a Myoview just to evaluate little bit more clearly if there is ischemia in that territory.  RCA is a big vessel and she had significant ischemia in that territory, I do think it might be worth revascularizing.  She also has mildly elevated filling pressures and may benefit from low-dosed diuretic.  She obviously needs to  stop smoking, we have counseled her on this.     Shaune Pascal. Shayanne Gomm, MD     DRB/MEDQ  D:  12/31/2010  T:  12/31/2010  Job:  [id number]  cc:   Miguel Aschoff, MD  Electronically Signed by Glori Bickers MD on 01/18/2011 02:47:10 PM

## 2011-01-20 NOTE — Telephone Encounter (Signed)
Pt was notified.  

## 2011-04-20 ENCOUNTER — Telehealth: Payer: Self-pay | Admitting: Internal Medicine

## 2011-04-20 NOTE — Telephone Encounter (Signed)
New Msg: Pt calling to schedule appt with Dr. Haroldine Laws. Please return pt call to discuss whether pt needs to be seen in [facility] or if pt care needs to be transferred to another MD. Please return pt call to discuss further.

## 2011-04-20 NOTE — Telephone Encounter (Signed)
Patient is calling to make a 6 months F/U visit with Dr. Haroldine Laws. Patient aware that the scheduler will call her back for appointment date and time. Pt is not a CHF pt . She is seen in this office by Dr. Haroldine Laws. Scheduler aware to call patient.Marland Kitchen

## 2011-06-09 ENCOUNTER — Encounter: Payer: Self-pay | Admitting: Pulmonary Disease

## 2011-06-09 ENCOUNTER — Ambulatory Visit (INDEPENDENT_AMBULATORY_CARE_PROVIDER_SITE_OTHER): Payer: Medicare Other | Admitting: Pulmonary Disease

## 2011-06-09 VITALS — BP 110/58 | HR 84 | Temp 98.6°F | Ht 64.0 in | Wt 164.0 lb

## 2011-06-09 DIAGNOSIS — J449 Chronic obstructive pulmonary disease, unspecified: Secondary | ICD-10-CM

## 2011-06-09 DIAGNOSIS — I639 Cerebral infarction, unspecified: Secondary | ICD-10-CM

## 2011-06-09 MED ORDER — ALBUTEROL SULFATE (2.5 MG/3ML) 0.083% IN NEBU: 2.5000 mg | INHALATION_SOLUTION | Freq: Four times a day (QID) | RESPIRATORY_TRACT | Status: DC | PRN

## 2011-06-09 NOTE — Patient Instructions (Signed)
Continue on symbicort Will try you again on spiriva, and let us know if you think it helps this time around Will check your oxygen level overnight to see if this may be contributing to your issues at night You need to quit smoking 100%. Will start albuterol neb treatments to use up to every 6 hrs only if needed for bad days. followup with me in 21mos.

## 2011-06-09 NOTE — Assessment & Plan Note (Signed)
The patient has known moderate COPD, and unfortunately continues to smoke, albeit in very small amounts.  She is having a lot of issues with dyspnea at night, and it is unclear whether this is orthopnea, or whether it may be related to desaturation.  Will check overnight oximetry for verification.  She has been tried on Spiriva in the past and did not see an improvement, but I would like to try this again.  I also stressed to her the importance of total smoking cessation.

## 2011-06-09 NOTE — Progress Notes (Signed)
  Subjective:    Patient ID: [name] [name], female    DOB: 1941/01/06, 71 y.o.   MRN: [id number]  HPI The patient comes in today for followup of her known COPD.  She has been having issues with increased shortness of breath, especially at night.  She is maintaining on symbicort, and really did not see a change with Spiriva last year.  She has had a recent cardiac catheterization with nonobstructive coronary disease, but did have an elevated left ventricular end-diastolic pressure.  The patient has cut way back on her smoking, but unfortunately will smoke intermittently.  She does have a cough, but produces only white mucus.  She does not have chest congestion, nor does she feel that she has a chest infection.   Review of Systems  Constitutional: Negative for fever and unexpected weight change.  HENT: Positive for sneezing and trouble swallowing. Negative for ear pain, nosebleeds, congestion, sore throat, rhinorrhea, dental problem, postnasal drip and sinus pressure.   Eyes: Positive for redness and itching.  Respiratory: Positive for cough, shortness of breath and wheezing. Negative for chest tightness.   Cardiovascular: Positive for leg swelling. Negative for palpitations.  Gastrointestinal: Negative for nausea and vomiting.  Genitourinary: Negative for dysuria.  Musculoskeletal: Negative for joint swelling.  Skin: Negative for rash.  Neurological: Negative for headaches.  Hematological: Does not bruise/bleed easily.  Psychiatric/Behavioral: Negative for dysphoric mood. The patient is not nervous/anxious.        Objective:   Physical Exam Well-developed female in no acute distress Nose without purulence or discharge noted Chest with mild decrease in breath sounds, a few crackles, but no active wheezing Cardiac exam with regular rate and rhythm Lower extremities with mild edema, no cyanosis Alert and oriented, moves all 4 extremities.       Assessment & Plan:

## 2011-06-09 NOTE — Progress Notes (Signed)
Addended by: Manson Allan on: 06/09/2011 11:39 AM   Modules accepted: Orders

## 2011-06-11 ENCOUNTER — Telehealth: Payer: Self-pay | Admitting: Pulmonary Disease

## 2011-06-11 NOTE — Telephone Encounter (Signed)
Advised pt that no samples are available at this time. She verbalized understanding and states nothing further needed.

## 2011-06-22 ENCOUNTER — Ambulatory Visit (INDEPENDENT_AMBULATORY_CARE_PROVIDER_SITE_OTHER): Payer: Medicare Other | Admitting: Cardiovascular Disease

## 2011-06-22 ENCOUNTER — Encounter: Payer: Self-pay | Admitting: Cardiovascular Disease

## 2011-06-22 VITALS — BP 102/68 | HR 73 | Ht 64.0 in | Wt 162.0 lb

## 2011-06-22 DIAGNOSIS — R079 Chest pain, unspecified: Secondary | ICD-10-CM

## 2011-06-22 DIAGNOSIS — I2510 Atherosclerotic heart disease of native coronary artery without angina pectoris: Secondary | ICD-10-CM

## 2011-06-22 DIAGNOSIS — F17200 Nicotine dependence, unspecified, uncomplicated: Secondary | ICD-10-CM

## 2011-06-22 DIAGNOSIS — E785 Hyperlipidemia, unspecified: Secondary | ICD-10-CM

## 2011-06-22 DIAGNOSIS — I10 Essential (primary) hypertension: Secondary | ICD-10-CM

## 2011-06-22 NOTE — Assessment & Plan Note (Signed)
Moderate disease in her LAD and RCA. We have encouraged smoking cessation and good cholesterol control, as well as better diabetes control.

## 2011-06-22 NOTE — Assessment & Plan Note (Signed)
Blood pressure is well controlled on today's visit. No changes made to the medications. 

## 2011-06-22 NOTE — Patient Instructions (Signed)
You are doing well. No medication changes were made.  Please call us if you have new issues that need to be addressed before your next appt.  Your physician wants you to follow-up in: 6 months.  You will receive a reminder letter in the mail two months in advance. If you don't receive a letter, please call our office to schedule the follow-up appointment.   

## 2011-06-22 NOTE — Progress Notes (Signed)
Patient ID: [name] [name], female    DOB: [birth year], 71 y.o.   MRN: [id number]  HPI Comments: [name] [name] is a very pleasant 71 year old woman with a history of  COPD with ongoing tobacco use, stroke, hypertension, diabetes, CVA and hyperlipidemia. H/o chest pain with minimal nonobstructive coronary artery disease by catheterization on May 08, 2004, repeat catheterization September 2012 showing 50% LAD disease, 60% RCA disease . She returns today for routine followup.     She denies any recent chest pain symptoms. She continues to smoke. Otherwise has no new complaints.  No lightheadedness, PND or orthopnea. She has residual gait instability from previous stroke. She is uncertain what her most recent cholesterol level was. ACE inhibitor has been held by pulmonary for possible cough.  EKG shows normal sinus rhythm with rate 78 beats per minute with no significant ST or T wave changes     Outpatient Encounter Prescriptions as of 06/22/2011  Medication Sig Dispense Refill  . albuterol (PROAIR HFA) 108 (90 BASE) MCG/ACT inhaler Inhale 2 puffs into the lungs every 3 (three) hours as needed.       Marland Kitchen albuterol (PROVENTIL) (2.5 MG/3ML) 0.083% nebulizer solution Take 3 mLs (2.5 mg total) by nebulization every 6 (six) hours as needed for shortness of breath. DX Code: COPD 496  120 vial  6  . aspirin 81 MG tablet Take 81 mg by mouth daily.      . budesonide-formoterol (SYMBICORT) 160-4.5 MCG/ACT inhaler Inhale 2 puffs into the lungs 2 (two) times daily.  1 Inhaler  5  . cholecalciferol (VITAMIN D) 1000 UNITS tablet Take 1,000 Units by mouth daily.      . Cyanocobalamin (VITAMIN B-12 IJ) Inject as directed every 30 (thirty) days.      . diazepam (VALIUM) 5 MG tablet Take 5 mg by mouth daily as needed.        . dipyridamole-aspirin (AGGRENOX) 25-200 MG per 12 hr capsule Take 1 capsule by mouth 2 (two) times daily.        Marland Kitchen gabapentin (NEURONTIN) 600 MG tablet Take 600 mg by mouth daily.        Marland Kitchen  glimepiride (AMARYL) 4 MG tablet Take 4 mg by mouth daily before breakfast.      . glucose blood test strip as directed.      . hydrochlorothiazide (HYDRODIURIL) 25 MG tablet Take 25 mg by mouth daily.      Marland Kitchen LANCETS ULTRA THIN MISC as directed.      Marland Kitchen losartan (COZAAR) 100 MG tablet Take 1 tablet by mouth daily.      Marland Kitchen losartan-hydrochlorothiazide (HYZAAR) 100-25 MG per tablet Take 1 tablet by mouth daily.        . magnesium oxide (MAG-OX) 400 MG tablet Take 2 tablets by mouth daily.        . metFORMIN (GLUCOPHAGE) 1000 MG tablet Take 1,000 mg by mouth daily.        . Multiple Vitamins-Minerals (CENTRUM SILVER PO) Take 1 tablet by mouth daily.        Marland Kitchen omeprazole (PRILOSEC) 20 MG capsule Take 20 mg by mouth daily.        Marland Kitchen oxyCODONE-acetaminophen (PERCOCET) 5-325 MG per tablet Take 1 tablet by mouth every 4 (four) hours as needed.        . potassium chloride (KLOR-CON) 10 MEQ CR tablet Take 10 mEq by mouth 3 (three) times daily.        . pravastatin (PRAVACHOL) 40 MG tablet Take 40 mg  by mouth at bedtime.        . saxagliptin HCl (ONGLYZA) 5 MG TABS tablet Take 5 mg by mouth daily.          Review of Systems  Constitutional: Negative.   HENT: Negative.   Eyes: Negative.   Respiratory: Negative.   Cardiovascular: Negative.   Gastrointestinal: Negative.   Musculoskeletal: Positive for gait problem.  Skin: Negative.   Neurological: Negative.   Hematological: Negative.   Psychiatric/Behavioral: Negative.   All other systems reviewed and are negative.    BP 102/68  Pulse 73  Ht 5\' 4"  (1.626 m)  Wt 162 lb (73.483 kg)  BMI 27.81 kg/m2  Physical Exam  Nursing note and vitals reviewed. Constitutional: She is oriented to person, place, and time. She appears well-developed and well-nourished.  HENT:  Head: Normocephalic.  Nose: Nose normal.  Mouth/Throat: Oropharynx is clear and moist.  Eyes: Conjunctivae are normal. Pupils are equal, round, and reactive to light.  Neck: Normal  range of motion. Neck supple. No JVD present.  Cardiovascular: Normal rate, regular rhythm, S1 normal, S2 normal, normal heart sounds and intact distal pulses.  Exam reveals no gallop and no friction rub.   No murmur heard. Pulmonary/Chest: Effort normal. No respiratory distress. She has decreased breath sounds. She has no wheezes. She has no rales. She exhibits no tenderness.  Abdominal: Soft. Bowel sounds are normal. She exhibits no distension. There is no tenderness.  Musculoskeletal: Normal range of motion. She exhibits no edema and no tenderness.  Lymphadenopathy:    She has no cervical adenopathy.  Neurological: She is alert and oriented to person, place, and time. Coordination normal.  Skin: Skin is warm and dry. No rash noted. No erythema.  Psychiatric: She has a normal mood and affect. Her behavior is normal. Judgment and thought content normal.         Assessment and Plan

## 2011-06-22 NOTE — Assessment & Plan Note (Signed)
We have encouraged her to continue to work on weaning her cigarettes and smoking cessation. She will continue to work on this and does not want any assistance with chantix.  

## 2011-06-22 NOTE — Assessment & Plan Note (Signed)
We will try to obtain her most recent lipid panel for our records

## 2011-06-22 NOTE — Assessment & Plan Note (Signed)
No recent chest pain. Prior episodes of chest pain have been likely noncardiac given the noncritical CAD.

## 2011-07-23 ENCOUNTER — Encounter: Payer: Self-pay | Admitting: Pulmonary Disease

## 2011-07-23 ENCOUNTER — Telehealth: Payer: Self-pay | Admitting: Pulmonary Disease

## 2011-07-23 NOTE — Telephone Encounter (Signed)
Please let pt know that we just received her overnight report, and it shows that her oxygen level does fall at night, but not terribly so.  I think she would benefit from oxygen at night at 2 lpm.  Can order if she is willing to wear, and not smoke while wearing.  Does not need during day.

## 2011-07-24 ENCOUNTER — Telehealth: Payer: Self-pay | Admitting: Pulmonary Disease

## 2011-07-24 ENCOUNTER — Encounter: Payer: Self-pay | Admitting: Pulmonary Disease

## 2011-07-24 DIAGNOSIS — J4489 Other specified chronic obstructive pulmonary disease: Secondary | ICD-10-CM

## 2011-07-24 DIAGNOSIS — J449 Chronic obstructive pulmonary disease, unspecified: Secondary | ICD-10-CM

## 2011-07-24 NOTE — Telephone Encounter (Signed)
Called and spoke with pt.  Informed her of ONO results and KC's recs.  Pt stated she would like to try oxygen at night and is aware she cannot smoke while wearing o2. Also, just an FYI:  Pt states she is getting in touch with smoking cessation program to work on this.  Will send message back to [facility] so he is aware of pt's response and to send order to [facility].

## 2011-07-24 NOTE — Telephone Encounter (Signed)
Order sent for nocturnal oxygen

## 2011-10-09 ENCOUNTER — Ambulatory Visit (INDEPENDENT_AMBULATORY_CARE_PROVIDER_SITE_OTHER): Payer: Medicare Other | Admitting: Pulmonary Disease

## 2011-10-09 ENCOUNTER — Encounter: Payer: Self-pay | Admitting: Pulmonary Disease

## 2011-10-09 VITALS — BP 108/62 | HR 74 | Temp 97.8°F | Ht 63.0 in | Wt 159.6 lb

## 2011-10-09 DIAGNOSIS — J449 Chronic obstructive pulmonary disease, unspecified: Secondary | ICD-10-CM

## 2011-10-09 NOTE — Progress Notes (Signed)
  Subjective:    Patient ID: [name] [name], female    DOB: 1941-04-06, 71 y.o.   MRN: [id number]  HPI Patient comes in today for followup of her known moderate COPD.  She is taking her medications compliantly, and has seen a difference with Spiriva this go around.  She is also wearing oxygen at night for her desaturations, and feels that her sleep is much improved.  Currently, she is continuing to smoke a pack of cigarettes a week, although this is an improvement from prior.  She is using her next treatment approximately one time a day, primarily because of the weather changes.  She tells me that she has not had a recent acute exacerbation.   Review of Systems  Constitutional: Negative for fever and unexpected weight change.  HENT: Positive for congestion. Negative for ear pain, nosebleeds, sore throat, rhinorrhea, sneezing, trouble swallowing, dental problem, postnasal drip and sinus pressure.   Eyes: Negative for redness and itching.  Respiratory: Positive for cough, choking and shortness of breath. Negative for chest tightness and wheezing.   Cardiovascular: Positive for leg swelling. Negative for palpitations.  Gastrointestinal: Negative for nausea and vomiting.  Genitourinary: Negative for dysuria.  Musculoskeletal: Negative for joint swelling.  Skin: Negative for rash.  Neurological: Negative for headaches.  Hematological: Does not bruise/bleed easily.  Psychiatric/Behavioral: Negative for dysphoric mood. The patient is not nervous/anxious.   All other systems reviewed and are negative.       Objective:   Physical Exam Well-developed female in no acute distress Nose without purulence or discharge noted Chest with mild decrease in breath sounds, no wheezes or rhonchi. Heart exam with regular rate and rhythm Lower extremities with no edema, no cyanosis  alert and oriented, moves all 4 extremities.       Assessment & Plan:

## 2011-10-09 NOTE — Patient Instructions (Addendum)
Stay on current medications The most important thing is to totally quit smoking.  This will have the biggest impact on your breathing. Talk with your primary about getting re-evaluated by speech therapy if you are having issues with swallowing/choking. If stable, followup with me in 2mos.

## 2011-10-09 NOTE — Assessment & Plan Note (Signed)
The patient overall is improved on her current medical regimen, including nocturnal oxygen.  I have told her the next step to improvement is to totally quit smoking.  Part of her ongoing cough is from airway irritation from her smoking, and the other may be related to her swallowing issues that she has had since her stroke.  It may be worthwhile to have her reevaluated again by speech.  I've asked her to continue on her bronchodilator regimen, to totally stop smoking, and to work on some type of conditioning program.

## 2011-12-16 ENCOUNTER — Ambulatory Visit: Payer: Self-pay | Admitting: Ophthalmology

## 2011-12-16 DIAGNOSIS — I10 Essential (primary) hypertension: Secondary | ICD-10-CM

## 2011-12-16 LAB — POTASSIUM: Potassium: 3.9 mmol/L (ref 3.5–5.1)

## 2011-12-21 ENCOUNTER — Ambulatory Visit (INDEPENDENT_AMBULATORY_CARE_PROVIDER_SITE_OTHER): Payer: Medicare Other | Admitting: Cardiovascular Disease

## 2011-12-21 ENCOUNTER — Encounter: Payer: Self-pay | Admitting: Cardiovascular Disease

## 2011-12-21 VITALS — BP 138/76 | HR 74 | Ht 64.0 in | Wt 161.0 lb

## 2011-12-21 DIAGNOSIS — I10 Essential (primary) hypertension: Secondary | ICD-10-CM

## 2011-12-21 DIAGNOSIS — I2510 Atherosclerotic heart disease of native coronary artery without angina pectoris: Secondary | ICD-10-CM

## 2011-12-21 DIAGNOSIS — R079 Chest pain, unspecified: Secondary | ICD-10-CM

## 2011-12-21 DIAGNOSIS — J449 Chronic obstructive pulmonary disease, unspecified: Secondary | ICD-10-CM

## 2011-12-21 DIAGNOSIS — E785 Hyperlipidemia, unspecified: Secondary | ICD-10-CM

## 2011-12-21 DIAGNOSIS — F17200 Nicotine dependence, unspecified, uncomplicated: Secondary | ICD-10-CM

## 2011-12-21 NOTE — Assessment & Plan Note (Signed)
We have encouraged her to stay on her statin. Goal LDL less than 70

## 2011-12-21 NOTE — Patient Instructions (Addendum)
You are doing well. No medication changes were made.  Please call us if you have new issues that need to be addressed before your next appt.  Your physician wants you to follow-up in: 12 months.  You will receive a reminder letter in the mail two months in advance. If you don't receive a letter, please call our office to schedule the follow-up appointment. 

## 2011-12-21 NOTE — Progress Notes (Signed)
Patient ID: [name] [name], female    DOB: 1941-01-02, 71 y.o.   MRN: [id number]  HPI Comments: [name] [name] is a very pleasant 71 year old woman with a history of  COPD with ongoing tobacco use, stroke, hypertension, diabetes, CVA and hyperlipidemia. H/o chest pain with minimal nonobstructive coronary artery disease by catheterization on May 08, 2004, repeat catheterization September 2012 showing 50% LAD disease, 60% RCA disease . She returns today for routine followup.     She reports that this past weekend she was very Sob, could not breath at 3 am She has been On oxygen 2 L. the episode lasted 3 hours, she used her inhalers and nebulizers and walk around her house until symptoms resolved 3 hours later.  She has More then one episode per month.  She continues to smoke 1/2 ppd. She has Tried the nicotine patch. She is afraid of the electronic cigarette Sugars around 150s. Low blood pressure low at times. Asymptomatic.  She denies any recent chest pain symptoms.  Otherwise has no new complaints.  No lightheadedness, PND or orthopnea.  ACE inhibitor has been held by pulmonary for possible cough.  EKG shows normal sinus rhythm with rate 72 beats per minute with no significant ST or T wave changes     Outpatient Encounter Prescriptions as of 12/21/2011  Medication Sig Dispense Refill  . albuterol (PROAIR HFA) 108 (90 BASE) MCG/ACT inhaler Inhale 2 puffs into the lungs every 3 (three) hours as needed.       Marland Kitchen albuterol (PROVENTIL) (2.5 MG/3ML) 0.083% nebulizer solution Take 3 mLs (2.5 mg total) by nebulization every 6 (six) hours as needed for shortness of breath. DX Code: COPD 496  120 vial  6  . aspirin 81 MG tablet Take 81 mg by mouth daily.      . budesonide-formoterol (SYMBICORT) 160-4.5 MCG/ACT inhaler Inhale 2 puffs into the lungs 2 (two) times daily.  1 Inhaler  5  . cholecalciferol (VITAMIN D) 1000 UNITS tablet Take 1,000 Units by mouth daily.      . Cyanocobalamin (VITAMIN B-12 IJ) Inject  as directed every 30 (thirty) days.      . diazepam (VALIUM) 5 MG tablet Take 5 mg by mouth daily as needed.        . dipyridamole-aspirin (AGGRENOX) 25-200 MG per 12 hr capsule Take 1 capsule by mouth 2 (two) times daily.        Marland Kitchen gabapentin (NEURONTIN) 600 MG tablet Take 600 mg by mouth daily.        Marland Kitchen glimepiride (AMARYL) 4 MG tablet Take 4 mg by mouth daily before breakfast.      . glucose blood test strip as directed.      . hydrochlorothiazide (HYDRODIURIL) 25 MG tablet Take 25 mg by mouth daily.      Marland Kitchen LANCETS ULTRA THIN MISC as directed.      Marland Kitchen losartan-hydrochlorothiazide (HYZAAR) 100-25 MG per tablet Take 1 tablet by mouth daily.        . magnesium oxide (MAG-OX) 400 MG tablet Take 2 tablets by mouth daily.        . metFORMIN (GLUCOPHAGE) 1000 MG tablet Take 1,000 mg by mouth daily.        . Multiple Vitamins-Minerals (CENTRUM SILVER PO) Take 1 tablet by mouth daily.        Marland Kitchen omeprazole (PRILOSEC) 20 MG capsule Take 20 mg by mouth daily.        Marland Kitchen oxyCODONE-acetaminophen (PERCOCET) 5-325 MG per tablet Take 1 tablet  by mouth every 4 (four) hours as needed.        . potassium chloride (KLOR-CON) 10 MEQ CR tablet Take 10 mEq by mouth 3 (three) times daily.        . pravastatin (PRAVACHOL) 40 MG tablet Take 40 mg by mouth at bedtime.        . saxagliptin HCl (ONGLYZA) 5 MG TABS tablet Take 5 mg by mouth daily.        Marland Kitchen tiotropium (SPIRIVA) 18 MCG inhalation capsule Place 18 mcg into inhaler and inhale daily.         Review of Systems  Constitutional: Negative.   HENT: Negative.   Eyes: Negative.   Respiratory: Negative.   Cardiovascular: Negative.   Gastrointestinal: Negative.   Musculoskeletal: Positive for gait problem.  Skin: Negative.   Neurological: Negative.   Hematological: Negative.   Psychiatric/Behavioral: Negative.   All other systems reviewed and are negative.    BP 138/76  Pulse 74  Ht 5\' 4"  (1.626 m)  Wt 161 lb (73.029 kg)  BMI 27.64 kg/m2  Physical Exam    Nursing note and vitals reviewed. Constitutional: She is oriented to person, place, and time. She appears well-developed and well-nourished.  HENT:  Head: Normocephalic.  Nose: Nose normal.  Mouth/Throat: Oropharynx is clear and moist.  Eyes: Conjunctivae normal are normal. Pupils are equal, round, and reactive to light.  Neck: Normal range of motion. Neck supple. No JVD present.  Cardiovascular: Normal rate, regular rhythm, S1 normal, S2 normal, normal heart sounds and intact distal pulses.  Exam reveals no gallop and no friction rub.   No murmur heard. Pulmonary/Chest: Effort normal. No respiratory distress. She has decreased breath sounds. She has no wheezes. She has no rales. She exhibits no tenderness.  Abdominal: Soft. Bowel sounds are normal. She exhibits no distension. There is no tenderness.  Musculoskeletal: Normal range of motion. She exhibits no edema and no tenderness.  Lymphadenopathy:    She has no cervical adenopathy.  Neurological: She is alert and oriented to person, place, and time. Coordination normal.  Skin: Skin is warm and dry. No rash noted. No erythema.  Psychiatric: She has a normal mood and affect. Her behavior is normal. Judgment and thought content normal.         Assessment and Plan

## 2011-12-21 NOTE — Assessment & Plan Note (Signed)
Blood pressure is well controlled on today's visit. No changes made to the medications. 

## 2011-12-21 NOTE — Assessment & Plan Note (Signed)
Currently with no symptoms of angina. No further workup at this time. Continue current medication regimen. We have suggested if she has worsening episodes of shortness of breath that she call our office. Recent episodes concerning for flare of her COPD.

## 2011-12-21 NOTE — Assessment & Plan Note (Signed)
She continues to smoke. We have encouraged her to try electronic cigarettes or other modalities for smoking cessation.

## 2011-12-21 NOTE — Assessment & Plan Note (Signed)
Recent episodes of shortness of breath possibly from underlying COPD with a flare. Usually improves with inhalers and nebulizers. Episodes are rare.

## 2011-12-28 ENCOUNTER — Ambulatory Visit: Payer: Self-pay | Admitting: Ophthalmology

## 2012-01-21 ENCOUNTER — Ambulatory Visit: Payer: Self-pay | Admitting: Family Medicine

## 2012-01-26 ENCOUNTER — Ambulatory Visit: Payer: Self-pay | Admitting: Family Medicine

## 2012-02-22 ENCOUNTER — Ambulatory Visit: Payer: Self-pay | Admitting: Family Medicine

## 2012-03-22 ENCOUNTER — Ambulatory Visit: Payer: Self-pay | Admitting: Family Medicine

## 2012-04-11 ENCOUNTER — Ambulatory Visit (INDEPENDENT_AMBULATORY_CARE_PROVIDER_SITE_OTHER): Payer: Medicare Other | Admitting: Pulmonary Disease

## 2012-04-11 ENCOUNTER — Encounter: Payer: Self-pay | Admitting: Pulmonary Disease

## 2012-04-11 VITALS — BP 120/66 | HR 84 | Temp 98.2°F

## 2012-04-11 DIAGNOSIS — J4489 Other specified chronic obstructive pulmonary disease: Secondary | ICD-10-CM

## 2012-04-11 DIAGNOSIS — J449 Chronic obstructive pulmonary disease, unspecified: Secondary | ICD-10-CM

## 2012-04-11 NOTE — Patient Instructions (Addendum)
Continue current meds Continue to stay away from cigarettes.  You are doing well. followup with me in 72mos

## 2012-04-11 NOTE — Assessment & Plan Note (Signed)
The patient is currently near her usual baseline after an episode of pneumonia last year.  She is currently not smoking, and I urged her to continue with this.  I've also asked her to stay on her current bronchodilator regimen.

## 2012-04-11 NOTE — Progress Notes (Signed)
  Subjective:    Patient ID: [name] [name], female    DOB: April 01, 1941, 72 y.o.   MRN: [id number]  HPI Patient comes in today for followup of her known COPD.  She had and doing very well until November of last year when she developed a pneumonia.  She was treated by her primary care physician and had resolution of her symptoms.  The only good to come out of this is that she has not smoked since that time.  She currently feels that she is nearly back to her usual baseline.  She denies any chest congestion or purulent mucus.   Review of Systems  Constitutional: Positive for fatigue. Negative for fever and unexpected weight change.  HENT: Negative for ear pain, nosebleeds, congestion, sore throat, rhinorrhea, sneezing, trouble swallowing, dental problem, postnasal drip and sinus pressure.   Eyes: Negative for redness and itching.  Respiratory: Positive for cough. Negative for chest tightness, shortness of breath and wheezing.   Cardiovascular: Negative for palpitations and leg swelling.  Gastrointestinal: Positive for nausea, vomiting and diarrhea.  Genitourinary: Negative for dysuria.  Musculoskeletal: Negative for joint swelling.  Skin: Negative for rash.  Neurological: Negative for headaches.  Hematological: Does not bruise/bleed easily.  Psychiatric/Behavioral: Negative for dysphoric mood. The patient is not nervous/anxious.        Objective:   Physical Exam Well-developed female in no acute distress Nose without purulent or discharge noted Neck without lymphadenopathy or thyromegaly Chest with decreased breath sounds, no wheezes or rhonchi. Cardiac exam with regular rate and rhythm Lower extremities without edema, no cyanosis Alert and oriented, moves all 4 extremities.       Assessment & Plan:

## 2012-07-29 ENCOUNTER — Ambulatory Visit: Payer: Medicare Other | Admitting: Cardiovascular Disease

## 2012-08-30 ENCOUNTER — Telehealth: Payer: Self-pay | Admitting: Pulmonary Disease

## 2012-08-30 MED ORDER — ALBUTEROL SULFATE (2.5 MG/3ML) 0.083% IN NEBU: 2.5000 mg | INHALATION_SOLUTION | Freq: Four times a day (QID) | RESPIRATORY_TRACT | Status: DC | PRN

## 2012-08-30 NOTE — Telephone Encounter (Signed)
Pt is needing refill on albuterol neb solution. Last OV on 04-2012 next OV on 10-10-12. Refill sent. [place] Bing, CMA

## 2012-09-05 ENCOUNTER — Encounter: Payer: Self-pay | Admitting: Cardiovascular Disease

## 2012-09-05 ENCOUNTER — Ambulatory Visit (INDEPENDENT_AMBULATORY_CARE_PROVIDER_SITE_OTHER): Payer: Medicare Other | Admitting: Cardiovascular Disease

## 2012-09-05 VITALS — BP 112/60 | HR 71 | Ht 64.0 in | Wt 154.2 lb

## 2012-09-05 DIAGNOSIS — R000 Tachycardia, unspecified: Secondary | ICD-10-CM

## 2012-09-05 DIAGNOSIS — R0789 Other chest pain: Secondary | ICD-10-CM

## 2012-09-05 DIAGNOSIS — I2510 Atherosclerotic heart disease of native coronary artery without angina pectoris: Secondary | ICD-10-CM

## 2012-09-05 DIAGNOSIS — R079 Chest pain, unspecified: Secondary | ICD-10-CM

## 2012-09-05 DIAGNOSIS — I10 Essential (primary) hypertension: Secondary | ICD-10-CM

## 2012-09-05 DIAGNOSIS — F17200 Nicotine dependence, unspecified, uncomplicated: Secondary | ICD-10-CM

## 2012-09-05 DIAGNOSIS — E785 Hyperlipidemia, unspecified: Secondary | ICD-10-CM

## 2012-09-05 DIAGNOSIS — R0602 Shortness of breath: Secondary | ICD-10-CM

## 2012-09-05 NOTE — Patient Instructions (Addendum)
Please cut the losartan hctz in 1/2 daily If cramps persist, hold the atorvastatin for a few weeks If cramps  Get better, call the office  Take aspirin 81 mg x 2 Hold the aggrenox Talk with Dr. Rosanna Randy  Please call us if you have new issues that need to be addressed before your next appt.  Your physician wants you to follow-up in: 6 months.  You will receive a reminder letter in the mail two months in advance. If you don't receive a letter, please call our office to schedule the follow-up appointment.

## 2012-09-05 NOTE — Assessment & Plan Note (Signed)
We have suggested she stay on her generic Lipitor. If muscle cramping does not improve, she could do a trial hold of her statin to see if symptoms improve.

## 2012-09-05 NOTE — Assessment & Plan Note (Signed)
Blood pressures running low. She is having significant muscle cramping. We have suggested she could try cutting her losartan HCT in half to see if this helps with her cramping. We have asked her to monitor her blood pressure

## 2012-09-05 NOTE — Assessment & Plan Note (Signed)
We have encouraged her to continue to work on weaning her cigarettes and smoking cessation. She will continue to work on this and does not want any assistance with chantix.  

## 2012-09-05 NOTE — Assessment & Plan Note (Signed)
Currently with no symptoms of angina. No further workup at this time. Continue current medication regimen. 

## 2012-09-05 NOTE — Progress Notes (Signed)
Patient ID: [name] [name], female    DOB: Oct 02, 1940, 72 y.o.   MRN: [id number]  HPI Comments: [name] a very pleasant 72 year old woman with a history of  COPD with ongoing tobacco use, stroke, hypertension, diabetes, CVA and hyperlipidemia. H/o chest pain with minimal nonobstructive coronary artery disease by catheterization on May 08, 2004, repeat catheterization September 2012 showing 50% LAD disease, 60% RCA disease . She returns today for routine followup.     Overall she reports that she is doing well. She does have significant cramping in her upper chest and legs on a regular basis. She is unable to afford Aggrenox, as this cost her $400 per month. Using the donut hole. Hemoglobin A1c has improved from 11 down to 8.4 earlier this year. She continues to smoke less than 1 pack per day. Has been does not smoke Otherwise she has no complaints She denies any recent chest pain symptoms.   No lightheadedness, PND or orthopnea.  ACE inhibitor has been held by pulmonary for possible cough.  EKG shows normal sinus rhythm with rate 71 beats per minute with no significant ST or T wave changes     Outpatient Encounter Prescriptions as of 09/05/2012  Medication Sig Dispense Refill  . albuterol (PROVENTIL) (2.5 MG/3ML) 0.083% nebulizer solution Take 3 mLs (2.5 mg total) by nebulization every 6 (six) hours as needed for shortness of breath. DX Code: COPD 496  120 vial  6  . aspirin 81 MG tablet Take 81 mg by mouth daily.      Marland Kitchen atorvastatin (LIPITOR) 20 MG tablet Take 20 mg by mouth daily.       . budesonide-formoterol (SYMBICORT) 160-4.5 MCG/ACT inhaler Inhale 2 puffs into the lungs 2 (two) times daily.  1 Inhaler  5  . cholecalciferol (VITAMIN D) 1000 UNITS tablet Take 1,000 Units by mouth daily.      . Cyanocobalamin (VITAMIN B-12 IJ) Inject as directed every 30 (thirty) days.      . diazepam (VALIUM) 5 MG tablet Take 5 mg by mouth daily as needed.        . dipyridamole-aspirin (AGGRENOX)  25-200 MG per 12 hr capsule Take 1 capsule by mouth 2 (two) times daily.        Marland Kitchen gabapentin (NEURONTIN) 600 MG tablet Take 600 mg by mouth 2 (two) times daily.       Marland Kitchen glimepiride (AMARYL) 4 MG tablet Take 4 mg by mouth 2 (two) times daily.       Marland Kitchen glucose blood test strip as directed.      . INVOKANA 300 MG TABS 300 mg daily.       Marland Kitchen LANCETS ULTRA THIN MISC as directed.      Marland Kitchen losartan-hydrochlorothiazide (HYZAAR) 100-25 MG per tablet Take 1 tablet by mouth daily.        . magnesium oxide (MAG-OX) 400 MG tablet Take 1 tablet by mouth 2 (two) times daily.       . metFORMIN (GLUCOPHAGE) 1000 MG tablet Take 1,000 mg by mouth daily.        . Multiple Vitamins-Minerals (CENTRUM SILVER PO) Take 1 tablet by mouth daily.        Marland Kitchen omeprazole (PRILOSEC) 20 MG capsule Take 20 mg by mouth daily.        Marland Kitchen oxyCODONE-acetaminophen (PERCOCET) 5-325 MG per tablet Take 1 tablet by mouth every 4 (four) hours as needed.        . potassium chloride (KLOR-CON) 10 MEQ CR tablet Take  10 mEq by mouth 3 (three) times daily.        . saxagliptin HCl (ONGLYZA) 5 MG TABS tablet Take 5 mg by mouth daily.        Marland Kitchen tiotropium (SPIRIVA) 18 MCG inhalation capsule Place 18 mcg into inhaler and inhale daily.        Review of Systems  Constitutional: Negative.   HENT: Negative.   Eyes: Negative.   Respiratory: Positive for shortness of breath.   Cardiovascular: Negative.   Gastrointestinal: Negative.   Musculoskeletal: Positive for myalgias.  Skin: Negative.   Neurological: Negative.   Psychiatric/Behavioral: Negative.   All other systems reviewed and are negative.    BP 112/60  Pulse 71  Ht 5\' 4"  (1.626 m)  Wt 154 lb 4 oz (69.967 kg)  BMI 26.46 kg/m2  Physical Exam  Nursing note and vitals reviewed. Constitutional: She is oriented to person, place, and time. She appears well-developed and well-nourished.  HENT:  Head: Normocephalic.  Nose: Nose normal.  Mouth/Throat: Oropharynx is clear and moist.  Eyes:  Conjunctivae are normal. Pupils are equal, round, and reactive to light.  Neck: Normal range of motion. Neck supple. No JVD present.  Cardiovascular: Normal rate, regular rhythm, S1 normal, S2 normal, normal heart sounds and intact distal pulses.  Exam reveals no gallop and no friction rub.   No murmur heard. Pulmonary/Chest: Effort normal. No respiratory distress. She has decreased breath sounds. She has no wheezes. She has no rales. She exhibits no tenderness.  Abdominal: Soft. Bowel sounds are normal. She exhibits no distension. There is no tenderness.  Musculoskeletal: Normal range of motion. She exhibits no edema and no tenderness.  Lymphadenopathy:    She has no cervical adenopathy.  Neurological: She is alert and oriented to person, place, and time. Coordination normal.  Skin: Skin is warm and dry. No rash noted. No erythema.  Psychiatric: She has a normal mood and affect. Her behavior is normal. Judgment and thought content normal.    Assessment and Plan

## 2012-10-10 ENCOUNTER — Encounter: Payer: Self-pay | Admitting: Pulmonary Disease

## 2012-10-10 ENCOUNTER — Ambulatory Visit (INDEPENDENT_AMBULATORY_CARE_PROVIDER_SITE_OTHER): Payer: Medicare Other | Admitting: Pulmonary Disease

## 2012-10-10 VITALS — BP 102/60 | HR 73 | Temp 97.4°F | Ht 64.0 in | Wt 155.0 lb

## 2012-10-10 DIAGNOSIS — J449 Chronic obstructive pulmonary disease, unspecified: Secondary | ICD-10-CM

## 2012-10-10 NOTE — Patient Instructions (Addendum)
Stay on current breathing meds, and oxygen at night while sleeping. Really try and stop smoking as we discussed. Stay as active as possible. Do not forget flu shot this fall.  followup with me in 69mos.

## 2012-10-10 NOTE — Progress Notes (Signed)
  Subjective:    Patient ID: [name] [name], female    DOB: 1940/09/07, 72 y.o.   MRN: [id number]  HPI The patient comes in today for followup of her known moderate COPD.  She is staying on her medications compliantly, but unfortunately has returned to smoking.  She has not had a recent acute exacerbation, and does not overuse her rescue inhaler.  She is continuing to wear her oxygen at night.   Review of Systems  Constitutional: Negative for fever and unexpected weight change.  HENT: Positive for trouble swallowing ( choking spells--saliva/fluids/food). Negative for ear pain, nosebleeds, congestion, sore throat, rhinorrhea, sneezing, dental problem, postnasal drip and sinus pressure.   Eyes: Negative for redness and itching.  Respiratory: Negative for cough, chest tightness, shortness of breath and wheezing.   Cardiovascular: Negative for palpitations and leg swelling.  Gastrointestinal: Negative for nausea and vomiting.  Genitourinary: Negative for dysuria.  Musculoskeletal: Negative for joint swelling.  Skin: Negative for rash.  Neurological: Negative for headaches.  Hematological: Does not bruise/bleed easily.  Psychiatric/Behavioral: Negative for dysphoric mood. The patient is not nervous/anxious.        Objective:   Physical Exam Overweight female in no acute distress Nose without purulent discharge noted Neck without lymphadenopathy or thyromegaly Chest with decreased breath sounds, no active wheezing Cardiac exam with regular rate and rhythm Lower extremities with no significant edema, no cyanosis Alert and oriented, moves all 4 extremities.       Assessment & Plan:

## 2012-10-10 NOTE — Assessment & Plan Note (Signed)
The pt appears to be stable from a pulmonary standpoint, but unfortunately has returned to smoking.  I have asked her to stay on her current meds, and also to work very hard on smoking cessation.  We have discussed the various ways to do this.  I will see her back in 6 months if she remains stable.

## 2012-12-19 ENCOUNTER — Ambulatory Visit: Payer: Self-pay | Admitting: Gastroenterology

## 2013-01-06 ENCOUNTER — Ambulatory Visit: Payer: Self-pay | Admitting: Family Medicine

## 2013-02-09 ENCOUNTER — Other Ambulatory Visit: Payer: Self-pay

## 2013-02-16 ENCOUNTER — Ambulatory Visit (INDEPENDENT_AMBULATORY_CARE_PROVIDER_SITE_OTHER): Payer: Medicare Other | Admitting: Cardiovascular Disease

## 2013-02-16 ENCOUNTER — Other Ambulatory Visit: Payer: Self-pay | Admitting: *Deleted

## 2013-02-16 ENCOUNTER — Encounter: Payer: Self-pay | Admitting: Cardiovascular Disease

## 2013-02-16 VITALS — BP 120/70 | HR 74 | Ht 64.0 in | Wt 154.0 lb

## 2013-02-16 DIAGNOSIS — J4489 Other specified chronic obstructive pulmonary disease: Secondary | ICD-10-CM

## 2013-02-16 DIAGNOSIS — I10 Essential (primary) hypertension: Secondary | ICD-10-CM

## 2013-02-16 DIAGNOSIS — I2510 Atherosclerotic heart disease of native coronary artery without angina pectoris: Secondary | ICD-10-CM

## 2013-02-16 DIAGNOSIS — F17200 Nicotine dependence, unspecified, uncomplicated: Secondary | ICD-10-CM

## 2013-02-16 DIAGNOSIS — E785 Hyperlipidemia, unspecified: Secondary | ICD-10-CM

## 2013-02-16 DIAGNOSIS — J449 Chronic obstructive pulmonary disease, unspecified: Secondary | ICD-10-CM

## 2013-02-16 DIAGNOSIS — R079 Chest pain, unspecified: Secondary | ICD-10-CM

## 2013-02-16 MED ORDER — NITROGLYCERIN 0.4 MG SL SUBL: 0.4000 mg | SUBLINGUAL_TABLET | SUBLINGUAL | Status: DC | PRN

## 2013-02-16 MED ORDER — ATORVASTATIN CALCIUM 40 MG PO TABS: 40.0000 mg | ORAL_TABLET | Freq: Every day | ORAL | Status: DC

## 2013-02-16 NOTE — Assessment & Plan Note (Signed)
Concerning for unstable angina. Cardiac catheterization offered as above. She will take nitroglycerin and call us for worsening symptoms

## 2013-02-16 NOTE — Patient Instructions (Addendum)
You are doing well. Please take nitro SL for chest pain Please increase the atorvastatin up to 40 mg a day  PLease call if you have worsening chest pain We would do a cardiac cath  Please call us if you have new issues that need to be addressed before your next appt.  Your physician wants you to follow-up in: 6 months.  You will receive a reminder letter in the mail two months in advance. If you don't receive a letter, please call our office to schedule the follow-up appointment.

## 2013-02-16 NOTE — Assessment & Plan Note (Signed)
We have encouraged her to continue to work on weaning her cigarettes and smoking cessation. She will continue to work on this and does not want any assistance with chantix.  

## 2013-02-16 NOTE — Assessment & Plan Note (Signed)
Blood pressure is well controlled on today's visit. No changes made to the medications. 

## 2013-02-16 NOTE — Telephone Encounter (Signed)
Requested Prescriptions   Signed Prescriptions Disp Refills  . nitroGLYCERIN (NITROSTAT) 0.4 MG SL tablet 25 tablet 3    Sig: Place 1 tablet (0.4 mg total) under the tongue every 5 (five) minutes as needed for chest pain.    Authorizing Provider: [name]    Ordering User: Eugenio Hoes, MARINA C  . atorvastatin (LIPITOR) 40 MG tablet 90 tablet 3    Sig: Take 1 tablet (40 mg total) by mouth daily.    Authorizing Provider: [name]    Ordering User: Britt Bottom

## 2013-02-16 NOTE — Assessment & Plan Note (Signed)
Severe underlying COPD. On oxygen at home. Recent course of prednisone

## 2013-02-16 NOTE — Assessment & Plan Note (Signed)
Cholesterol is above goal. We will increase her Lipitor up to 40 mg daily. Goal LDL less than 70

## 2013-02-16 NOTE — Progress Notes (Signed)
Patient ID: [name] [name], female    DOB: 05-26-1940, 72 y.o.   MRN: [id number]  HPI Comments: [name] [name] is a  pleasant 72 year old woman with a history of  COPD with ongoing tobacco use, stroke, hypertension, diabetes, CVA and hyperlipidemia. H/o chest pain with minimal nonobstructive coronary artery disease by catheterization on May 08, 2004, repeat catheterization September 2012 showing 50% LAD disease, 60% RCA disease . She returns today for routine followup.     On visit today, she reports having worsening shortness of breath. Recently had upper respiratory infection requiring prednisone. She has tightness in her chest and severe shortness of breath with any exertion, problems at rest even. She's able to do her ADLs. She reports her sugars have been high from recent prednisone. She does have occasional chest pain, typically at rest, lasts with exertion, but she is worried about underlying CAD. Previous hemoglobin A1c greater than 8  She continues to smoke less than 1 pack per day.  No lightheadedness, PND or orthopnea.  ACE inhibitor has been held by pulmonary for possible cough.  Total cholesterol 210, LDL 130, HDL 43  EKG shows normal sinus rhythm with rate 74 beats per minute with no significant ST or T wave changes     Outpatient Encounter Prescriptions as of 02/16/2013  Medication Sig  . albuterol (PROVENTIL) (2.5 MG/3ML) 0.083% nebulizer solution Take 3 mLs (2.5 mg total) by nebulization every 6 (six) hours as needed for shortness of breath. DX Code: COPD 37  . aspirin 81 MG tablet Take 81 mg by mouth daily.  Marland Kitchen atorvastatin (LIPITOR) 20 MG tablet Take 20 mg by mouth daily.   . budesonide-formoterol (SYMBICORT) 160-4.5 MCG/ACT inhaler Inhale 2 puffs into the lungs 2 (two) times daily.  . cholecalciferol (VITAMIN D) 1000 UNITS tablet Take 1,000 Units by mouth daily.  . diazepam (VALIUM) 5 MG tablet Take 5 mg by mouth daily as needed.    . dipyridamole-aspirin (AGGRENOX) 25-200 MG  per 12 hr capsule Take 1 capsule by mouth 2 (two) times daily.    Marland Kitchen gabapentin (NEURONTIN) 600 MG tablet Take 600 mg by mouth 2 (two) times daily.   Marland Kitchen glimepiride (AMARYL) 4 MG tablet Take 4 mg by mouth 2 (two) times daily.   Marland Kitchen glucose blood test strip as directed.  . INVOKANA 300 MG TABS 300 mg daily.   Marland Kitchen LANCETS ULTRA THIN MISC as directed.  Marland Kitchen losartan-hydrochlorothiazide (HYZAAR) 100-25 MG per tablet Take 1 tablet by mouth daily.    . magnesium oxide (MAG-OX) 400 MG tablet Take 1 tablet by mouth 2 (two) times daily.   . metFORMIN (GLUCOPHAGE) 1000 MG tablet Take 1,000 mg by mouth daily.    . Multiple Vitamins-Minerals (CENTRUM SILVER PO) Take 1 tablet by mouth daily.    Marland Kitchen omeprazole (PRILOSEC) 20 MG capsule Take 20 mg by mouth daily.    Marland Kitchen oxyCODONE-acetaminophen (PERCOCET) 5-325 MG per tablet Take 1 tablet by mouth every 4 (four) hours as needed.    . potassium chloride (KLOR-CON) 10 MEQ CR tablet Take 10 mEq by mouth 3 (three) times daily.    . saxagliptin HCl (ONGLYZA) 5 MG TABS tablet Take 5 mg by mouth daily.    Marland Kitchen tiotropium (SPIRIVA) 18 MCG inhalation capsule Place 18 mcg into inhaler and inhale daily.  . [DISCONTINUED] Cyanocobalamin (VITAMIN B-12 IJ) Inject as directed every 30 (thirty) days.    Review of Systems  Constitutional: Negative.   HENT: Negative.   Eyes: Negative.   Respiratory:  Positive for shortness of breath.   Cardiovascular: Positive for chest pain.  Gastrointestinal: Negative.   Musculoskeletal: Positive for myalgias.  Skin: Negative.   Neurological: Negative.   Psychiatric/Behavioral: Negative.   All other systems reviewed and are negative.    BP 120/70  Pulse 74  Ht 5\' 4"  (1.626 m)  Wt 154 lb (69.854 kg)  BMI 26.42 kg/m2  Physical Exam  Nursing note and vitals reviewed. Constitutional: She is oriented to person, place, and time. She appears well-developed and well-nourished.  HENT:  Head: Normocephalic.  Nose: Nose normal.  Mouth/Throat:  Oropharynx is clear and moist.  Eyes: Conjunctivae are normal. Pupils are equal, round, and reactive to light.  Neck: Normal range of motion. Neck supple. No JVD present.  Cardiovascular: Normal rate, regular rhythm, S1 normal, S2 normal, normal heart sounds and intact distal pulses.  Exam reveals no gallop and no friction rub.   No murmur heard. Pulmonary/Chest: Effort normal. No respiratory distress. She has decreased breath sounds. She has no wheezes. She has no rales. She exhibits no tenderness.  Abdominal: Soft. Bowel sounds are normal. She exhibits no distension. There is no tenderness.  Musculoskeletal: Normal range of motion. She exhibits no edema and no tenderness.  Lymphadenopathy:    She has no cervical adenopathy.  Neurological: She is alert and oriented to person, place, and time. Coordination normal.  Skin: Skin is warm and dry. No rash noted. No erythema.  Psychiatric: She has a normal mood and affect. Her behavior is normal. Judgment and thought content normal.    Assessment and Plan

## 2013-02-16 NOTE — Assessment & Plan Note (Addendum)
I'm concerned about recent symptoms of chest pain. She has a chronic baseline severe shortness of breath, on oxygen at home. We have offered cardiac catheterization. She would like to talk to her husband and see how her symptoms go. She will call us if symptoms get worse, particularly with exertion. We have given her nitroglycerin sublingual for chest pain symptoms.

## 2013-03-07 ENCOUNTER — Ambulatory Visit: Payer: Medicare Other | Admitting: Adult Health

## 2013-03-29 ENCOUNTER — Ambulatory Visit (INDEPENDENT_AMBULATORY_CARE_PROVIDER_SITE_OTHER): Payer: Medicare Other | Admitting: Pulmonary Disease

## 2013-03-29 ENCOUNTER — Encounter: Payer: Self-pay | Admitting: Pulmonary Disease

## 2013-03-29 VITALS — BP 112/66 | HR 91 | Temp 98.0°F | Ht 64.0 in | Wt 152.0 lb

## 2013-03-29 DIAGNOSIS — J449 Chronic obstructive pulmonary disease, unspecified: Secondary | ICD-10-CM

## 2013-03-29 DIAGNOSIS — J441 Chronic obstructive pulmonary disease with (acute) exacerbation: Secondary | ICD-10-CM | POA: Insufficient documentation

## 2013-03-29 MED ORDER — PREDNISONE 10 MG PO TABS: ORAL_TABLET | ORAL | Status: DC

## 2013-03-29 MED ORDER — CEFDINIR 300 MG PO CAPS: ORAL_CAPSULE | ORAL | Status: DC

## 2013-03-29 NOTE — Assessment & Plan Note (Signed)
The patient is having a classic COPD exacerbation related to ongoing airway inflammation from smoking and probably developing acute bronchitis. She will need to be treated with a course of antibiotics and prednisone, and again I have stressed to her the importance of total smoking cessation. She is on an excellent maintenance bronchodilator regimen, and I've asked her to continue. She understands there are really no medications which are going to keep her well if she continues to smoke.

## 2013-03-29 NOTE — Patient Instructions (Signed)
Will treat with omnicef 300mg , take 2 each am for 5 days. Prednisone taper over 8 days for your breathing.  You need to stop smoking to keep this from recurring. Stop tudorza, but continue on spiriva and symbicort. followup with me again in 69mos, but call if you are having breathing issues.

## 2013-03-29 NOTE — Progress Notes (Signed)
   Subjective:    Patient ID: [name] [name], female    DOB: 1940-05-05, 72 y.o.   MRN: [id number]  HPI The patient comes in today as an acute sick visit. She has known COPD, and unfortunately continues to smoke. She gives a 3 to four-week history of increasing shortness of breath, chest congestion, and a cough with white mucus currently. She feels very congested, and feels that she is not mobilizing her mucus. She is staying compliant on her maintenance bronchodilator regimen.   Review of Systems  Constitutional: Negative for fever and unexpected weight change.  HENT: Positive for congestion, postnasal drip and rhinorrhea. Negative for dental problem, ear pain, nosebleeds, sinus pressure, sneezing, sore throat and trouble swallowing.   Eyes: Negative for redness and itching.  Respiratory: Positive for cough, chest tightness, shortness of breath and wheezing.   Cardiovascular: Negative for palpitations and leg swelling.  Gastrointestinal: Negative for nausea and vomiting.  Genitourinary: Negative for dysuria.  Musculoskeletal: Negative for joint swelling.  Skin: Negative for rash.  Neurological: Negative for headaches.  Hematological: Does not bruise/bleed easily.  Psychiatric/Behavioral: Negative for dysphoric mood. The patient is not nervous/anxious.        Objective:   Physical Exam Well-developed female in no acute distress Nose without purulence or discharge noted Neck without lymphadenopathy or thyromegaly Chest with very diminished breath sounds bilaterally, no active wheezing. Cardiac exam with distant heart sounds, but regular Mild lower extremity edema noted, no cyanosis Alert and oriented, moves all 4 extremities.       Assessment & Plan:

## 2013-04-12 ENCOUNTER — Ambulatory Visit: Payer: Medicare Other | Admitting: Pulmonary Disease

## 2013-04-20 ENCOUNTER — Ambulatory Visit (INDEPENDENT_AMBULATORY_CARE_PROVIDER_SITE_OTHER)
Admission: RE | Admit: 2013-04-20 | Discharge: 2013-04-20 | Disposition: A | Discharge status: Home / Self Care | Payer: Medicare Other | Source: Ambulatory Visit | Attending: Pulmonary Disease | Admitting: Pulmonary Disease

## 2013-04-20 ENCOUNTER — Telehealth: Payer: Self-pay | Admitting: Pulmonary Disease

## 2013-04-20 ENCOUNTER — Encounter: Payer: Self-pay | Admitting: Pulmonary Disease

## 2013-04-20 ENCOUNTER — Ambulatory Visit (INDEPENDENT_AMBULATORY_CARE_PROVIDER_SITE_OTHER): Payer: Medicare Other | Admitting: Pulmonary Disease

## 2013-04-20 VITALS — BP 122/62 | HR 88 | Temp 98.4°F | Ht 64.0 in | Wt 151.6 lb

## 2013-04-20 DIAGNOSIS — J441 Chronic obstructive pulmonary disease with (acute) exacerbation: Secondary | ICD-10-CM

## 2013-04-20 DIAGNOSIS — J4489 Other specified chronic obstructive pulmonary disease: Secondary | ICD-10-CM

## 2013-04-20 DIAGNOSIS — J449 Chronic obstructive pulmonary disease, unspecified: Secondary | ICD-10-CM

## 2013-04-20 MED ORDER — LEVOFLOXACIN 750 MG PO TABS: 750.0000 mg | ORAL_TABLET | Freq: Every day | ORAL | Status: DC

## 2013-04-20 MED ORDER — PREDNISONE 10 MG PO TABS: ORAL_TABLET | ORAL | Status: DC

## 2013-04-20 NOTE — Assessment & Plan Note (Signed)
The patient appears to be having another COPD exacerbation, despite being treated recently with antibiotics and steroids, and smoking cessation. She has been taking her medications compliantly. I will treat her with another round of antibiotics and prednisone, and also check a chest x-ray to make sure she does not have pneumonia. I am hoping now that she has stopped smoking, these will become less of an issue.

## 2013-04-20 NOTE — Progress Notes (Signed)
   Subjective:    Patient ID: [name] [name], female    DOB: 05/10/1940, 73 y.o.   MRN: [id number]  HPI The patient comes in today for an acute sick visit. She has known COPD, and a history of ongoing smoking. She had an acute exacerbation just before Christmas of last year, and was treated with antibiotics and prednisone. She also tells me that she has not had a single cigarette since that time. She improved significantly after treatment, but did not totally return to her normal baseline. Nevertheless, she was doing well until a trip to [place] this past weekend. While there, she developed chest congestion, cough with purulent mucus, wheezing, and increased shortness of breath. She also had increased sinus pressure and nasal congestion. She denies fevers, chills, or sweats.   Review of Systems  Constitutional: Negative for fever and unexpected weight change.  HENT: Positive for congestion. Negative for dental problem, ear pain, nosebleeds, postnasal drip, rhinorrhea, sinus pressure, sneezing, sore throat and trouble swallowing.   Eyes: Negative for redness and itching.  Respiratory: Positive for cough, chest tightness, shortness of breath and wheezing.   Cardiovascular: Positive for chest pain. Negative for palpitations and leg swelling.  Gastrointestinal: Negative for nausea and vomiting.  Genitourinary: Negative for dysuria.  Musculoskeletal: Negative for joint swelling.  Skin: Negative for rash.  Neurological: Negative for headaches.  Hematological: Does not bruise/bleed easily.  Psychiatric/Behavioral: Negative for dysphoric mood. The patient is not nervous/anxious.        Objective:   Physical Exam Overweight female in no acute distress Nose without purulence or discharge noted Oropharynx clear Neck without lymphadenopathy or thyromegaly Chest with rhonchi throughout, decreased breath sounds, no active wheezing Cardiac exam with regular rate and rhythm Lower extremities without  edema, no cyanosis Alert and oriented, moves all 4 extremities.       Assessment & Plan:

## 2013-04-20 NOTE — Patient Instructions (Signed)
Will treat with a course of prednisone over the next 8 days. levaquin 750mg  one a day for 7 days. Will check chest xray to make sure you do not have pneumonia.  Will call you with results. Can use tylenol cold and sinus for your nasal symptoms.  Can also try nasal saline spray as well. Keep scheduled followup with me, but let us know if you are not better in 2-3 days.

## 2013-04-20 NOTE — Telephone Encounter (Signed)
Called and spoke with pt and she stated that she finished the omnicef and pred taper, and is still no better.  appt has been scheduled for pt today with [place].  Pt is aware

## 2013-06-05 ENCOUNTER — Ambulatory Visit: Payer: Self-pay | Admitting: Family Medicine

## 2013-07-28 ENCOUNTER — Ambulatory Visit (INDEPENDENT_AMBULATORY_CARE_PROVIDER_SITE_OTHER): Payer: Medicare Other | Admitting: Pulmonary Disease

## 2013-07-28 ENCOUNTER — Encounter: Payer: Self-pay | Admitting: Pulmonary Disease

## 2013-07-28 VITALS — BP 122/74 | HR 82 | Temp 97.2°F | Ht 64.0 in | Wt 154.6 lb

## 2013-07-28 DIAGNOSIS — J441 Chronic obstructive pulmonary disease with (acute) exacerbation: Secondary | ICD-10-CM

## 2013-07-28 DIAGNOSIS — J449 Chronic obstructive pulmonary disease, unspecified: Secondary | ICD-10-CM

## 2013-07-28 MED ORDER — PREDNISONE 10 MG PO TABS: ORAL_TABLET | ORAL | Status: DC

## 2013-07-28 MED ORDER — DOXYCYCLINE HYCLATE 100 MG PO TABS: 100.0000 mg | ORAL_TABLET | Freq: Two times a day (BID) | ORAL | Status: DC

## 2013-07-28 NOTE — Progress Notes (Signed)
   Subjective:    Patient ID: [name] [name], female    DOB: February 03, 1941, 73 y.o.   MRN: [id number]  HPI The patient comes in today for her usual followup, but is having acute symptoms. She has been staying on her pulmonary regimen, and tells me she is not smoking cigarettes. Instead, she is using an electronic cigarette.  She gives a one-week history of increasing chest congestion, cough with thick white mucus, and increasing shortness of breath with wheezing. She denies any fevers, chills, or sweats.   Review of Systems  Constitutional: Negative for fever and unexpected weight change.  HENT: Negative for congestion, dental problem, ear pain, nosebleeds, postnasal drip, rhinorrhea, sinus pressure, sneezing, sore throat and trouble swallowing.   Eyes: Negative for redness and itching.  Respiratory: Positive for cough and chest tightness. Negative for shortness of breath and wheezing.   Cardiovascular: Negative for palpitations and leg swelling.  Gastrointestinal: Negative for nausea and vomiting.  Genitourinary: Negative for dysuria.  Musculoskeletal: Negative for joint swelling.  Skin: Negative for rash.  Neurological: Negative for headaches.  Hematological: Does not bruise/bleed easily.  Psychiatric/Behavioral: Negative for dysphoric mood. The patient is not nervous/anxious.        Objective:   Physical Exam Well-developed female in no acute distress Nose without purulence or discharge noted Neck without lymphadenopathy or thyromegaly Chest with decreased breath sounds, a few rhonchi, no active wheezing Cardiac exam with regular rate and rhythm Lower extremities with mild edema, no cyanosis Alert and oriented, moves all 4 extremities.       Assessment & Plan:

## 2013-07-28 NOTE — Assessment & Plan Note (Signed)
The patient's history is suggestive of a developing COPD exacerbation.  Will treat her with an antibiotic and a short course of prednisone to get her through this episode. The patient tells me that she is not smoking cigarettes, but rather electronic cigarettes. I've asked her to continue her usual pulmonary medications, but will try her on a Spiriva Respimat rather than the HandiHaler.

## 2013-07-28 NOTE — Patient Instructions (Signed)
Will start you on doxycycline 100mg  one each am and pm for 5 days Prednisone taper over 8 days Try spiriva respimat 2 inhalations each am as a trial for the next 4 weeks in the place of your regular spiriva.  Call us in 4 weeks to let us know which one you would like to stay on.  No change in other breathing meds. followup with me again in 68mos.

## 2013-08-03 ENCOUNTER — Other Ambulatory Visit: Payer: Self-pay | Admitting: Pulmonary Disease

## 2013-08-03 MED ORDER — ALBUTEROL SULFATE (2.5 MG/3ML) 0.083% IN NEBU: 2.5000 mg | INHALATION_SOLUTION | Freq: Four times a day (QID) | RESPIRATORY_TRACT | Status: DC | PRN

## 2013-08-03 NOTE — Telephone Encounter (Signed)
Received Rx refill request from [facility] for Albuterol neb. Pt current with OV, rx sent electronically.

## 2013-08-14 ENCOUNTER — Telehealth: Payer: Self-pay

## 2013-08-14 DIAGNOSIS — Z01812 Encounter for preprocedural laboratory examination: Secondary | ICD-10-CM

## 2013-08-14 DIAGNOSIS — R079 Chest pain, unspecified: Secondary | ICD-10-CM

## 2013-08-14 NOTE — Telephone Encounter (Signed)
Pt called and states she is having left arm pain, SOB, some chest pain. States she has taken her Nitro "a couple times this week" took 1 yesterday 6 pm. Please call.

## 2013-08-14 NOTE — Telephone Encounter (Signed)
Spoke w/ pt.  She reports that she has felt bad for the past week, w/ chest pain on and off. Reports stabbing pain thru her chest, back and thru her left arm. She states that nitro helps, but does not completely relieve her pain. Last episode was last night around 6 pm. States that her BP during last two episodes were 101/44 and 130/61. On her last ov in 11/14, pt was advised to call the office w/ more chest pain to see about setting up a cath. She would like to proceed w/ this.  Pt sched to see Dr. Rockey Situ tomorrow at 2:00. Pt understands to call the office if sx recur before her appt.

## 2013-08-14 NOTE — Telephone Encounter (Signed)
Perhaps we should schedule a cardiac catheterization this Friday, second case in  preparation for procedure We can always cancel if not needed Would confirm with her that this would be okay and let her know that this can be canceled Trying to get prepared

## 2013-08-14 NOTE — Telephone Encounter (Signed)
Cath sched for 5/15 @ 8:30. Pt aware and will keep appt w/ Dr. Rockey Situ tomorrow to discuss whether to proceed with this.

## 2013-08-15 ENCOUNTER — Encounter: Payer: Self-pay | Admitting: Cardiovascular Disease

## 2013-08-15 ENCOUNTER — Ambulatory Visit (INDEPENDENT_AMBULATORY_CARE_PROVIDER_SITE_OTHER): Payer: Medicare Other | Admitting: Cardiovascular Disease

## 2013-08-15 VITALS — BP 128/70 | HR 82 | Ht 64.0 in | Wt 147.5 lb

## 2013-08-15 DIAGNOSIS — I2510 Atherosclerotic heart disease of native coronary artery without angina pectoris: Secondary | ICD-10-CM

## 2013-08-15 DIAGNOSIS — J449 Chronic obstructive pulmonary disease, unspecified: Secondary | ICD-10-CM

## 2013-08-15 DIAGNOSIS — I200 Unstable angina: Secondary | ICD-10-CM | POA: Insufficient documentation

## 2013-08-15 DIAGNOSIS — F17200 Nicotine dependence, unspecified, uncomplicated: Secondary | ICD-10-CM

## 2013-08-15 DIAGNOSIS — R079 Chest pain, unspecified: Secondary | ICD-10-CM

## 2013-08-15 DIAGNOSIS — I10 Essential (primary) hypertension: Secondary | ICD-10-CM

## 2013-08-15 DIAGNOSIS — Z01812 Encounter for preprocedural laboratory examination: Secondary | ICD-10-CM

## 2013-08-15 DIAGNOSIS — E785 Hyperlipidemia, unspecified: Secondary | ICD-10-CM

## 2013-08-15 MED ORDER — CLOPIDOGREL BISULFATE 75 MG PO TABS: 75.0000 mg | ORAL_TABLET | Freq: Every day | ORAL | Status: DC

## 2013-08-15 MED ORDER — LOSARTAN POTASSIUM-HCTZ 100-25 MG PO TABS: 0.5000 | ORAL_TABLET | Freq: Every day | ORAL | Status: DC

## 2013-08-15 MED ORDER — ISOSORBIDE MONONITRATE ER 30 MG PO TB24: 30.0000 mg | ORAL_TABLET | Freq: Every day | ORAL | Status: DC

## 2013-08-15 NOTE — Assessment & Plan Note (Signed)
We have encouraged her to continue to work on weaning her cigarettes and smoking cessation. She will continue to work on this and does not want any assistance with chantix.  

## 2013-08-15 NOTE — Progress Notes (Signed)
Patient ID: [name] [name], female    DOB: Feb 13, 1941, 73 y.o.   MRN: [id number]  HPI Comments: [name] [name] is a  pleasant 73 year old woman with a history of  COPD with ongoing tobacco use, stroke, hypertension, diabetes, CVA and hyperlipidemia. H/o chest pain with minimal nonobstructive coronary artery disease by catheterization on May 08, 2004, repeat catheterization September 2012 showing 50% LAD disease, 60% RCA disease . She returns today for routine followup.     On visit today, she reports having worsening chest pain. Symptoms have been coming on for several weeks. Symptoms were severe last Saturday, Sunday, Monday. She's been taking nitroglycerin frequently for symptoms with relief provided after one or 2 sublingual nitroglycerin. She has noticed some symptoms with exertion, as well as symptoms with rest. She denies any recent upper respiratory infections. She has a long history of chronic shortness of breath likely secondary to COPD  She continues to have problems with diabetes control, continues to smoke daily   No lightheadedness, PND or orthopnea.  ACE inhibitor has been held by pulmonary for possible cough.   previous Total cholesterol 210, LDL 130, HDL 43  EKG shows normal sinus rhythm with rate 82 beats per minute with no significant ST or T wave changes     Outpatient Encounter Prescriptions as of 08/15/2013  Medication Sig  . albuterol (PROVENTIL) (2.5 MG/3ML) 0.083% nebulizer solution Take 3 mLs (2.5 mg total) by nebulization every 6 (six) hours as needed for shortness of breath. DX Code: COPD 85  . aspirin 81 MG tablet Take 81 mg by mouth daily.  Marland Kitchen atorvastatin (LIPITOR) 40 MG tablet Take 1 tablet (40 mg total) by mouth daily.  . budesonide-formoterol (SYMBICORT) 160-4.5 MCG/ACT inhaler Inhale 2 puffs into the lungs 2 (two) times daily.  . cholecalciferol (VITAMIN D) 1000 UNITS tablet Take 1,000 Units by mouth daily.  . diazepam (VALIUM) 5 MG tablet Take 5 mg by mouth  daily as needed.    . gabapentin (NEURONTIN) 600 MG tablet Take 600 mg by mouth 2 (two) times daily.   Marland Kitchen glimepiride (AMARYL) 4 MG tablet Take 4 mg by mouth 2 (two) times daily.   Marland Kitchen glucose blood test strip as directed.  Marland Kitchen LANCETS ULTRA THIN MISC as directed.  Marland Kitchen losartan-hydrochlorothiazide (HYZAAR) 100-25 MG per tablet Take 1 tablet by mouth daily.    . magnesium oxide (MAG-OX) 400 MG tablet Take 1 tablet by mouth 2 (two) times daily.   . metFORMIN (GLUCOPHAGE) 1000 MG tablet Take 1,000 mg by mouth daily.    . Multiple Vitamins-Minerals (CENTRUM SILVER PO) Take 1 tablet by mouth daily.    . nitroGLYCERIN (NITROSTAT) 0.4 MG SL tablet Place 1 tablet (0.4 mg total) under the tongue every 5 (five) minutes as needed for chest pain.  Marland Kitchen omeprazole (PRILOSEC) 20 MG capsule Take 20 mg by mouth daily.    Marland Kitchen oxyCODONE-acetaminophen (PERCOCET) 5-325 MG per tablet Take 1 tablet by mouth every 4 (four) hours as needed.    . potassium chloride (KLOR-CON) 10 MEQ CR tablet Take 10 mEq by mouth 3 (three) times daily.    . saxagliptin HCl (ONGLYZA) 5 MG TABS tablet Take 5 mg by mouth daily.    Marland Kitchen tiotropium (SPIRIVA) 18 MCG inhalation capsule Place 18 mcg into inhaler and inhale daily.   Review of Systems  Constitutional: Negative.   HENT: Negative.   Eyes: Negative.   Respiratory: Positive for chest tightness and shortness of breath.   Cardiovascular: Positive for chest pain.  Gastrointestinal: Negative.   Endocrine: Negative.   Musculoskeletal: Positive for myalgias.  Skin: Negative.   Allergic/Immunologic: Negative.   Neurological: Negative.   Hematological: Negative.   Psychiatric/Behavioral: Negative.   All other systems reviewed and are negative.   BP 128/70  Pulse 82  Ht 5\' 4"  (1.626 m)  Wt 147 lb 8 oz (66.906 kg)  BMI 25.31 kg/m2  Physical Exam  Nursing note and vitals reviewed. Constitutional: She is oriented to person, place, and time. She appears well-developed and well-nourished.   HENT:  Head: Normocephalic.  Nose: Nose normal.  Mouth/Throat: Oropharynx is clear and moist.  Eyes: Conjunctivae are normal. Pupils are equal, round, and reactive to light.  Neck: Normal range of motion. Neck supple. No JVD present.  Cardiovascular: Normal rate, regular rhythm, S1 normal, S2 normal, normal heart sounds and intact distal pulses.  Exam reveals no gallop and no friction rub.   No murmur heard. Pulmonary/Chest: Effort normal. No respiratory distress. She has decreased breath sounds. She has no wheezes. She has no rales. She exhibits no tenderness.  Abdominal: Soft. Bowel sounds are normal. She exhibits no distension. There is no tenderness.  Musculoskeletal: Normal range of motion. She exhibits no edema and no tenderness.  Lymphadenopathy:    She has no cervical adenopathy.  Neurological: She is alert and oriented to person, place, and time. Coordination normal.  Skin: Skin is warm and dry. No rash noted. No erythema.  Psychiatric: She has a normal mood and affect. Her behavior is normal. Judgment and thought content normal.    Assessment and Plan

## 2013-08-15 NOTE — Patient Instructions (Addendum)
You are doing well. Please start plavix one a day  Cut the losartan HCTZ in 1/2 a day Start the isosorbide one a day  We will schedule you for a cardiac cath this friday  Please call us if you have new issues that need to be addressed before your next appt.  Your physician wants you to follow-up in: 1 month.   [facility] Cardiac Cath Instructions   You are scheduled for a Cardiac Cath on:___Friday, May 15____  Please arrive at _7:30am_ on the day of your procedure  You will need to pre-register prior to the day of your procedure.  Enter through the [employer] at [facility].  Registration is the first desk on your right.  Please take the procedure order we have given you in order to be registered appropriately  Do not eat/drink anything after midnight  Someone will need to drive you home  It is recommended someone be with you for the first 24 hours after your procedure  Wear clothes that are easy to get on/off and wear slip on shoes if possible   Medications bring a current list of all medications with you  _X__ Do not take these medications before your procedure: 1.  METFORMIN for 24 hrs prior to and 48 hrs after procedure 2.  LOSARTAN HCTZ the day of procedure.  Day of your procedure: Arrive at the [facility] entrance.  Free valet service is available.  After entering the [facility] please check-in at the registration desk (1st desk on your right) to receive your armband. After receiving your armband someone will escort you to the cardiac cath/special procedures waiting area.  The usual length of stay after your procedure is about 2 to 3 hours.  This can vary.  If you have any questions, please call our office at [phone], or you may call the cardiac cath lab at [facility] directly at [phone]

## 2013-08-15 NOTE — Assessment & Plan Note (Signed)
Symptoms concerning for unstable angina. Worsening chest pain, sometimes at rest and with exertion. She is very high risk of worsening CAD. She had moderate CAD by cardiac catheterization in 2012. She continues to smoke, poorly controlled diabetes, hyperlipidemia. We have scheduled her for cardiac catheterization in several days' time. We will start her on isosorbide mononitrate 30 mg daily  We'll also start Plavix 75 mg daily with her aspirin No recent echocardiogram. No signs of heart failure on today's visit.

## 2013-08-15 NOTE — Assessment & Plan Note (Addendum)
We will start isosorbide mononitrate. We have recommended she cut her losartan HCTZ in half.

## 2013-08-15 NOTE — Assessment & Plan Note (Signed)
Chronic mild shortness of breath likely from long history of smoking and COPD. Unable to exclude ischemia as a cause of her symptoms.

## 2013-08-15 NOTE — Assessment & Plan Note (Signed)
Previous cardiac catheterization in 2012 showing moderate CAD. Now with unstable angina. Cardiac catheterization scheduled. Risk and benefits discussed with her. She is willing to proceed

## 2013-08-15 NOTE — Assessment & Plan Note (Signed)
Encouraged her to stay on her Lipitor. Goal LDL less than 70

## 2013-08-16 LAB — CBC WITH DIFFERENTIAL
BASOS: 0 %
Basophils Absolute: 0 10*3/uL (ref 0.0–0.2)
EOS: 1 %
Eosinophils Absolute: 0.1 10*3/uL (ref 0.0–0.4)
HCT: 42.3 % (ref 34.0–46.6)
HEMOGLOBIN: 14.4 g/dL (ref 11.1–15.9)
Immature Grans (Abs): 0 10*3/uL (ref 0.0–0.1)
Immature Granulocytes: 0 %
Lymphocytes Absolute: 1.6 10*3/uL (ref 0.7–3.1)
Lymphs: 19 %
MCH: 31.2 pg (ref 26.6–33.0)
MCHC: 34 g/dL (ref 31.5–35.7)
MCV: 92 fL (ref 79–97)
Monocytes Absolute: 0.6 10*3/uL (ref 0.1–0.9)
Monocytes: 7 %
NEUTROS PCT: 73 %
Neutrophils Absolute: 6.2 10*3/uL (ref 1.4–7.0)
Platelets: 275 10*3/uL (ref 150–379)
RBC: 4.62 x10E6/uL (ref 3.77–5.28)
RDW: 14.3 % (ref 12.3–15.4)
WBC: 8.5 10*3/uL (ref 3.4–10.8)

## 2013-08-16 LAB — BASIC METABOLIC PANEL
BUN/Creatinine Ratio: 16 (ref 11–26)
BUN: 13 mg/dL (ref 8–27)
CO2: 19 mmol/L (ref 18–29)
Calcium: 9.7 mg/dL (ref 8.7–10.3)
Chloride: 97 mmol/L (ref 97–108)
Creatinine, Ser: 0.79 mg/dL (ref 0.57–1.00)
GFR calc Af Amer: 87 mL/min/{1.73_m2} (ref >59)
GFR, EST NON AFRICAN AMERICAN: 76 mL/min/{1.73_m2} (ref >59)
GLUCOSE: 329 mg/dL — AB (ref 65–99)
Potassium: 4.4 mmol/L (ref 3.5–5.2)
Sodium: 139 mmol/L (ref 134–144)

## 2013-08-16 LAB — PROTIME-INR
INR: 0.9 (ref 0.8–1.2)
Prothrombin Time: 9.5 s (ref 9.1–12.0)

## 2013-08-18 ENCOUNTER — Ambulatory Visit: Payer: Self-pay | Admitting: Cardiovascular Disease

## 2013-08-18 ENCOUNTER — Encounter: Payer: Self-pay | Admitting: Cardiovascular Disease

## 2013-08-18 DIAGNOSIS — I200 Unstable angina: Secondary | ICD-10-CM

## 2013-08-25 ENCOUNTER — Encounter: Payer: Self-pay | Admitting: Cardiovascular Disease

## 2013-08-25 ENCOUNTER — Ambulatory Visit (INDEPENDENT_AMBULATORY_CARE_PROVIDER_SITE_OTHER): Payer: Medicare Other | Admitting: Cardiovascular Disease

## 2013-08-25 ENCOUNTER — Other Ambulatory Visit: Payer: Self-pay | Admitting: *Deleted

## 2013-08-25 VITALS — BP 110/62 | HR 73 | Ht 64.0 in | Wt 151.2 lb

## 2013-08-25 DIAGNOSIS — F17200 Nicotine dependence, unspecified, uncomplicated: Secondary | ICD-10-CM

## 2013-08-25 DIAGNOSIS — M79609 Pain in unspecified limb: Secondary | ICD-10-CM

## 2013-08-25 DIAGNOSIS — I10 Essential (primary) hypertension: Secondary | ICD-10-CM

## 2013-08-25 DIAGNOSIS — E785 Hyperlipidemia, unspecified: Secondary | ICD-10-CM

## 2013-08-25 DIAGNOSIS — M79602 Pain in left arm: Secondary | ICD-10-CM

## 2013-08-25 DIAGNOSIS — I2510 Atherosclerotic heart disease of native coronary artery without angina pectoris: Secondary | ICD-10-CM

## 2013-08-25 MED ORDER — ISOSORBIDE MONONITRATE ER 30 MG PO TB24: 30.0000 mg | ORAL_TABLET | Freq: Every day | ORAL | Status: DC

## 2013-08-25 NOTE — Assessment & Plan Note (Signed)
Recommended that she stay on her Lipitor

## 2013-08-25 NOTE — Telephone Encounter (Signed)
Requested Prescriptions   Signed Prescriptions Disp Refills  . isosorbide mononitrate (IMDUR) 30 MG 24 hr tablet 90 tablet 3    Sig: Take 1 tablet (30 mg total) by mouth daily.    Authorizing Provider: [name]    Ordering User: Britt Bottom

## 2013-08-25 NOTE — Assessment & Plan Note (Signed)
We have encouraged her to continue to work on weaning her cigarettes and smoking cessation. She will continue to work on this and does not want any assistance with chantix.  

## 2013-08-25 NOTE — Patient Instructions (Addendum)
You are doing well. Please stop the Plavix  Dr. Sharlet Salina: [phone]  Please call us if you have new issues that need to be addressed before your next appt.  Your physician wants you to follow-up in: 12 months.  You will receive a reminder letter in the mail two months in advance. If you don't receive a letter, please call our office to schedule the follow-up appointment.

## 2013-08-25 NOTE — Progress Notes (Signed)
Patient ID: [name] [name], female    DOB: [birth year], 73 y.o.   MRN: [id number]  HPI Comments: [name] [name] is a 73 year old woman with a history of  COPD with ongoing tobacco use, stroke, hypertension, diabetes, CVA and hyperlipidemia. H/o chest pain with minimal nonobstructive coronary artery disease by catheterization on May 08, 2004, repeat catheterization September 2012 showing 50% LAD disease, 60% RCA disease, recently seen in the clinic for worsening chest and left arm pain. Cardiac catheterization was performed.  She had catheterization last week that showed no significant CAD. There is no evidence of previously seen moderate disease in the LAD or RCA. It was felt her left arm symptoms are from musculoskeletal etiology, unable to exclude arthritis in her neck and DJD. She has tried leftover pain pills at home, NSAIDs with no relief. She reports symptoms are severe. She's requesting referral to orthopedics Blood pressure measurements from home are for the most part in a reasonable range, labile, sometimes low, sometimes in the 140s, rarely 893 systolic. Predominantly in the 810-175 range systolic  She has a long history of chronic shortness of breath likely secondary to COPD She continues to have problems with diabetes control. No lightheadedness, PND or orthopnea. ACE inhibitor has been held by pulmonary for possible cough.   previous Total cholesterol 210, LDL 130, HDL 43  EKG shows normal sinus rhythm with rate 73 beats per minute with no significant ST or T wave changes     Outpatient Encounter Prescriptions as of 08/25/2013  Medication Sig  . albuterol (PROVENTIL) (2.5 MG/3ML) 0.083% nebulizer solution Take 3 mLs (2.5 mg total) by nebulization every 6 (six) hours as needed for shortness of breath. DX Code: COPD 36  . aspirin 81 MG tablet Take 81 mg by mouth daily.  Marland Kitchen atorvastatin (LIPITOR) 40 MG tablet Take 1 tablet (40 mg total) by mouth daily.  . budesonide-formoterol  (SYMBICORT) 160-4.5 MCG/ACT inhaler Inhale 2 puffs into the lungs 2 (two) times daily.  . cholecalciferol (VITAMIN D) 1000 UNITS tablet Take 1,000 Units by mouth daily.  . diazepam (VALIUM) 5 MG tablet Take 5 mg by mouth daily as needed.    . gabapentin (NEURONTIN) 600 MG tablet Take 600 mg by mouth 2 (two) times daily.   Marland Kitchen glimepiride (AMARYL) 4 MG tablet Take 4 mg by mouth 2 (two) times daily.   Marland Kitchen glucose blood test strip as directed.  . isosorbide mononitrate (IMDUR) 30 MG 24 hr tablet Take 1 tablet (30 mg total) by mouth daily.  Marland Kitchen LANCETS ULTRA THIN MISC as directed.  Marland Kitchen losartan-hydrochlorothiazide (HYZAAR) 100-25 MG per tablet Take 0.5 tablets by mouth daily.  . magnesium oxide (MAG-OX) 400 MG tablet Take 1 tablet by mouth 2 (two) times daily.   . metFORMIN (GLUCOPHAGE) 1000 MG tablet Take 1,000 mg by mouth daily.    . Multiple Vitamins-Minerals (CENTRUM SILVER PO) Take 1 tablet by mouth daily.    . nitroGLYCERIN (NITROSTAT) 0.4 MG SL tablet Place 1 tablet (0.4 mg total) under the tongue every 5 (five) minutes as needed for chest pain.  Marland Kitchen omeprazole (PRILOSEC) 20 MG capsule Take 20 mg by mouth daily.    Marland Kitchen oxyCODONE-acetaminophen (PERCOCET) 5-325 MG per tablet Take 1 tablet by mouth every 4 (four) hours as needed.    . potassium chloride (KLOR-CON) 10 MEQ CR tablet Take 10 mEq by mouth 3 (three) times daily.    . saxagliptin HCl (ONGLYZA) 5 MG TABS tablet Take 5 mg by mouth daily.    Marland Kitchen  tiotropium (SPIRIVA) 18 MCG inhalation capsule Place 18 mcg into inhaler and inhale daily.  . clopidogrel (PLAVIX) 75 MG tablet Take 1 tablet (75 mg total) by mouth daily.    Review of Systems  Constitutional: Negative.   HENT: Negative.   Eyes: Negative.   Cardiovascular:       Left arm pain  Gastrointestinal: Negative.   Endocrine: Negative.   Musculoskeletal: Positive for myalgias.  Skin: Negative.   Allergic/Immunologic: Negative.   Neurological: Negative.   Hematological: Negative.    Psychiatric/Behavioral: Negative.   All other systems reviewed and are negative.    BP 110/62  Pulse 73  Ht 5\' 4"  (1.626 m)  Wt 151 lb 4 oz (68.607 kg)  BMI 25.95 kg/m2  Physical Exam  Nursing note and vitals reviewed. Constitutional: She is oriented to person, place, and time. She appears well-developed and well-nourished.  HENT:  Head: Normocephalic.  Nose: Nose normal.  Mouth/Throat: Oropharynx is clear and moist.  Eyes: Conjunctivae are normal. Pupils are equal, round, and reactive to light.  Neck: Normal range of motion. Neck supple. No JVD present.  Cardiovascular: Normal rate, regular rhythm, S1 normal, S2 normal, normal heart sounds and intact distal pulses.  Exam reveals no gallop and no friction rub.   No murmur heard. Pulmonary/Chest: Effort normal. No respiratory distress. She has decreased breath sounds. She has no wheezes. She has no rales. She exhibits no tenderness.  Abdominal: Soft. Bowel sounds are normal. She exhibits no distension. There is no tenderness.  Musculoskeletal: Normal range of motion. She exhibits no edema and no tenderness.  Lymphadenopathy:    She has no cervical adenopathy.  Neurological: She is alert and oriented to person, place, and time. Coordination normal.  Skin: Skin is warm and dry. No rash noted. No erythema.  Psychiatric: She has a normal mood and affect. Her behavior is normal. Judgment and thought content normal.    Assessment and Plan

## 2013-08-25 NOTE — Assessment & Plan Note (Signed)
No significant coronary artery disease seen on recent cardiac catheterization. Symptoms are noncardiac, likely from DJD of the neck with radiating nerve pain, unable to exclude shoulder arthritis

## 2013-08-25 NOTE — Assessment & Plan Note (Signed)
Blood pressure is well controlled on today's visit. No changes made to the medications. 

## 2013-08-29 ENCOUNTER — Ambulatory Visit: Payer: Self-pay | Admitting: Family Medicine

## 2013-09-11 ENCOUNTER — Ambulatory Visit: Payer: Self-pay | Admitting: Family Medicine

## 2013-09-21 ENCOUNTER — Ambulatory Visit: Payer: Medicare Other | Admitting: Cardiovascular Disease

## 2013-10-04 ENCOUNTER — Telehealth: Payer: Self-pay

## 2013-10-04 NOTE — Telephone Encounter (Signed)
Pt called, states Dr. Arnoldo Morale office faxed a clearance for her arm surgery, and she is calling to check the status. Please call.

## 2013-10-09 NOTE — Telephone Encounter (Signed)
Faxed cardiac clearance for pt to proceed w/ cervical fusion to [place] at [phone].

## 2013-10-10 ENCOUNTER — Other Ambulatory Visit: Payer: Self-pay | Admitting: Neurosurgery

## 2013-10-12 ENCOUNTER — Encounter (HOSPITAL_COMMUNITY): Payer: Self-pay | Admitting: Pharmacy Technician

## 2013-10-14 NOTE — Pre-Procedure Instructions (Addendum)
[name] [name]  10/14/2013   Your procedure is scheduled on:  July 16  Report to [facility] Admitting at 0930 AM.  Call this number if you have problems the morning of surgery: [phone]   Remember:   Do not eat food or drink liquids after midnight.   Take these medicines the morning of surgery with A SIP OF WATER: Albuterol, Symbicort, Valium (if needed), Gabapentin, Hydrocodone OR Oxycodone (if needed), Isosorbide, Spiriva, Omeprazole   STOP Aspirin, Ocuvite, Vitamin D, Magnesium, Multiple Vitamins, today   STOP/ Do not take Aspirin, Aleve, Naproxen, Advil, Ibuprofen, Motrin, Vitamins, Herbs, or Supplements starting today     NO DIABETIC MEDS DAY OF SURGERY   Do not wear jewelry, make-up or nail polish.  Do not wear lotions, powders, or perfumes. You may wear deodorant.  Do not shave 48 hours prior to surgery. Men may shave face and neck.  Do not bring valuables to the hospital.  [facility] is not responsible for any belongings or valuables.               Contacts, dentures or bridgework may not be worn into surgery.  Leave suitcase in the car. After surgery it may be brought to your room.  For patients admitted to the hospital, discharge time is determined by your treatment team.               Special Instructions:  Special Instructions: [place] - Preparing for Surgery  Before surgery, you can play an important role.  Because skin is not sterile, your skin needs to be as free of germs as possible.  You can reduce the number of germs on you skin by washing with CHG (chlorahexidine gluconate) soap before surgery.  CHG is an antiseptic cleaner which kills germs and bonds with the skin to continue killing germs even after washing.  Please DO NOT use if you have an allergy to CHG or antibacterial soaps.  If your skin becomes reddened/irritated stop using the CHG and inform your nurse when you arrive at Short Stay.  Do not shave (including legs and underarms) for at  least 48 hours prior to the first CHG shower.  You may shave your face.  Please follow these instructions carefully:   1.  Shower with CHG Soap the night before surgery and the morning of Surgery.  2.  If you choose to wash your hair, wash your hair first as usual with your normal shampoo.  3.  After you shampoo, rinse your hair and body thoroughly to remove the Shampoo.  4.  Use CHG as you would any other liquid soap.  You can apply chg directly  to the skin and wash gently with scrungie or a clean washcloth.  5.  Apply the CHG Soap to your body ONLY FROM THE NECK DOWN.  Do not use on open wounds or open sores.  Avoid contact with your eyes ears, mouth and genitals (private parts).  Wash genitals (private parts)       with your normal soap.  6.  Wash thoroughly, paying special attention to the area where your surgery will be performed.  7.  Thoroughly rinse your body with warm water from the neck down.  8.  DO NOT shower/wash with your normal soap after using and rinsing off the CHG Soap.  9.  Pat yourself dry with a clean towel.            10.  Wear clean pajamas.  11.  Place clean sheets on your bed the night of your first shower and do not sleep with pets.  Day of Surgery  Do not apply any lotions/deodorants the morning of surgery.  Please wear clean clothes to the hospital/surgery center.   Please read over the following fact sheets that you were given: Pain Booklet, Coughing and Deep Breathing and Surgical Site Infection Prevention

## 2013-10-16 ENCOUNTER — Encounter (HOSPITAL_COMMUNITY)
Admission: RE | Admit: 2013-10-16 | Discharge: 2013-10-16 | Disposition: A | Discharge status: Home / Self Care | Payer: Medicare Other | Source: Ambulatory Visit | Attending: Anesthesiology | Admitting: Anesthesiology

## 2013-10-16 ENCOUNTER — Encounter (HOSPITAL_COMMUNITY): Payer: Self-pay

## 2013-10-16 ENCOUNTER — Encounter (HOSPITAL_COMMUNITY)
Admission: RE | Admit: 2013-10-16 | Discharge: 2013-10-16 | Disposition: A | Discharge status: Home / Self Care | Payer: Medicare Other | Source: Ambulatory Visit | Attending: Neurosurgery | Admitting: Neurosurgery

## 2013-10-16 HISTORY — DX: Shortness of breath: R06.02

## 2013-10-16 HISTORY — DX: Unspecified macular degeneration: H35.30

## 2013-10-16 HISTORY — DX: Chronic obstructive pulmonary disease, unspecified: J44.9

## 2013-10-16 HISTORY — DX: Pneumonia, unspecified organism: J18.9

## 2013-10-16 LAB — BASIC METABOLIC PANEL
ANION GAP: 14 (ref 5–15)
BUN: 15 mg/dL (ref 6–23)
CO2: 28 meq/L (ref 19–32)
CREATININE: 0.84 mg/dL (ref 0.50–1.10)
Calcium: 8.9 mg/dL (ref 8.4–10.5)
Chloride: 98 mEq/L (ref 96–112)
GFR calc Af Amer: 79 mL/min — ABNORMAL LOW (ref >90)
GFR calc non Af Amer: 68 mL/min — ABNORMAL LOW (ref >90)
Glucose, Bld: 72 mg/dL (ref 70–99)
Potassium: 4.1 mEq/L (ref 3.7–5.3)
Sodium: 140 mEq/L (ref 137–147)

## 2013-10-16 LAB — SURGICAL PCR SCREEN
MRSA, PCR: POSITIVE — AB
STAPHYLOCOCCUS AUREUS: POSITIVE — AB

## 2013-10-16 LAB — CBC
HCT: 41.9 % (ref 36.0–46.0)
HEMOGLOBIN: 13.9 g/dL (ref 12.0–15.0)
MCH: 31.2 pg (ref 26.0–34.0)
MCHC: 33.2 g/dL (ref 30.0–36.0)
MCV: 93.9 fL (ref 78.0–100.0)
Platelets: 261 10*3/uL (ref 150–400)
RBC: 4.46 MIL/uL (ref 3.87–5.11)
RDW: 14.7 % (ref 11.5–15.5)
WBC: 11.4 10*3/uL — ABNORMAL HIGH (ref 4.0–10.5)

## 2013-10-16 NOTE — Progress Notes (Signed)
A Zelenak pa consulted re: low bp. Patient says reads like in 80's few times a week.  Instructed patient to call pcp and let him know.

## 2013-10-17 NOTE — Progress Notes (Signed)
Anesthesia Chart Review:  Patient is a 73 year old female scheduled for C5-6 ACDF on 10/19/13 by Dr. Arnoldo Morale.  History includes smoking, CAD, HTN, DM2, HLD, CVA > 5 years ago, COPD, chronic SOB, PNA, right eye macular degeneration, cholecystectomy, back surgery. Cardiologist is Dr. Rockey Situ.  Notes in Epic indicate that his office faxed cardiac clearance to Dr. Arnoldo Morale' office (see telephone encounter 10/09/13 from [place], [place]).  He saw her last on 08/25/13 following a cardiac cath for evaluation of SOB with arm pain.  Results showed no significant CAD, etiology was felt likely musculoskeletal which lead to finding of cervical disc disease.  Pulmonologist is Dr. Gwenette Greet. PCP is Dr. Miguel Aschoff. Of note, patient's BP on arrival was recorded at 81/53.  Reportedly, she was not symptomatic and told her PAT RN that her SBP occasionally runs that low.  She had taken her BP medication.  Her BP was rechecked and recorded as 107/63. She is on Imdur, but her Hyzaar will be held on the morning of surgery which should help her hypotension.  In the interim, she was encouraged to follow-up with Dr. Rosanna Randy in case he would like to make any medication adjustments.  Her previous SBP readings in Epic have been ~ 110 - 120's since 03/2013.  Cardiac cath on 08/18/13 [facility]) showed: Coronary angiography demonstrated moderate luminal regularities. Right dominant coronary system with no significant CAD, diffuse luminal irregularities noted. Normal EF estimated at 55%. No significant AS or MR.  EKG on 08/25/13 showed NSR, short PR, non-specific T wave abnormality.  CXR on 10/16/13 showed: No active cardiopulmonary disease.  Preoperative labs noted.  If BP is reasonable and no acute changes then I would anticipate that she could proceed as planned.  [facility] Short Stay Center/Anesthesiology Phone [phone] 10/17/2013 12:37 PM

## 2013-10-18 MED ORDER — CEFAZOLIN SODIUM-DEXTROSE 2-3 GM-% IV SOLR
2.0000 g | INTRAVENOUS | Status: AC
Start: 2013-10-19 — End: 2013-10-19
  Administered 2013-10-19: 2 g via INTRAVENOUS
  Filled 2013-10-18 (×2): qty 50

## 2013-10-19 ENCOUNTER — Ambulatory Visit (HOSPITAL_COMMUNITY): Payer: Medicare Other

## 2013-10-19 ENCOUNTER — Ambulatory Visit (HOSPITAL_COMMUNITY): Payer: Medicare Other | Admitting: Anesthesiology

## 2013-10-19 ENCOUNTER — Encounter (HOSPITAL_COMMUNITY): Payer: Medicare Other | Admitting: Vascular Surgery

## 2013-10-19 ENCOUNTER — Ambulatory Visit (HOSPITAL_COMMUNITY)
Admission: RE | Admit: 2013-10-19 | Discharge: 2013-10-20 | Disposition: A | Discharge status: Home / Self Care | Payer: Medicare Other | Source: Ambulatory Visit | Attending: Neurosurgery | Admitting: Neurosurgery

## 2013-10-19 ENCOUNTER — Encounter (HOSPITAL_COMMUNITY): Payer: Self-pay | Admitting: *Deleted

## 2013-10-19 ENCOUNTER — Encounter (HOSPITAL_COMMUNITY)
Admission: RE | Disposition: A | Discharge status: Home / Self Care | Payer: Self-pay | Source: Ambulatory Visit | Attending: Neurosurgery

## 2013-10-19 DIAGNOSIS — F17200 Nicotine dependence, unspecified, uncomplicated: Secondary | ICD-10-CM | POA: Insufficient documentation

## 2013-10-19 DIAGNOSIS — E785 Hyperlipidemia, unspecified: Secondary | ICD-10-CM | POA: Insufficient documentation

## 2013-10-19 DIAGNOSIS — H3530 Unspecified macular degeneration: Secondary | ICD-10-CM | POA: Insufficient documentation

## 2013-10-19 DIAGNOSIS — Z7982 Long term (current) use of aspirin: Secondary | ICD-10-CM | POA: Insufficient documentation

## 2013-10-19 DIAGNOSIS — Z8673 Personal history of transient ischemic attack (TIA), and cerebral infarction without residual deficits: Secondary | ICD-10-CM | POA: Insufficient documentation

## 2013-10-19 DIAGNOSIS — G8929 Other chronic pain: Secondary | ICD-10-CM | POA: Insufficient documentation

## 2013-10-19 DIAGNOSIS — M4722 Other spondylosis with radiculopathy, cervical region: Secondary | ICD-10-CM

## 2013-10-19 DIAGNOSIS — I2510 Atherosclerotic heart disease of native coronary artery without angina pectoris: Secondary | ICD-10-CM | POA: Insufficient documentation

## 2013-10-19 DIAGNOSIS — Z01812 Encounter for preprocedural laboratory examination: Secondary | ICD-10-CM | POA: Insufficient documentation

## 2013-10-19 DIAGNOSIS — M5030 Other cervical disc degeneration, unspecified cervical region: Secondary | ICD-10-CM | POA: Insufficient documentation

## 2013-10-19 DIAGNOSIS — M47812 Spondylosis without myelopathy or radiculopathy, cervical region: Secondary | ICD-10-CM | POA: Insufficient documentation

## 2013-10-19 DIAGNOSIS — J4489 Other specified chronic obstructive pulmonary disease: Secondary | ICD-10-CM | POA: Insufficient documentation

## 2013-10-19 DIAGNOSIS — I252 Old myocardial infarction: Secondary | ICD-10-CM | POA: Insufficient documentation

## 2013-10-19 DIAGNOSIS — J449 Chronic obstructive pulmonary disease, unspecified: Secondary | ICD-10-CM | POA: Insufficient documentation

## 2013-10-19 DIAGNOSIS — I509 Heart failure, unspecified: Secondary | ICD-10-CM | POA: Insufficient documentation

## 2013-10-19 DIAGNOSIS — E119 Type 2 diabetes mellitus without complications: Secondary | ICD-10-CM | POA: Insufficient documentation

## 2013-10-19 DIAGNOSIS — Z01818 Encounter for other preprocedural examination: Secondary | ICD-10-CM | POA: Insufficient documentation

## 2013-10-19 DIAGNOSIS — I10 Essential (primary) hypertension: Secondary | ICD-10-CM | POA: Insufficient documentation

## 2013-10-19 HISTORY — PX: ANTERIOR CERVICAL DECOMP/DISCECTOMY FUSION: SHX1161

## 2013-10-19 LAB — GLUCOSE, CAPILLARY
GLUCOSE-CAPILLARY: 162 mg/dL — AB (ref 70–99)
Glucose-Capillary: 136 mg/dL — ABNORMAL HIGH (ref 70–99)
Glucose-Capillary: 293 mg/dL — ABNORMAL HIGH (ref 70–99)

## 2013-10-19 SURGERY — ANTERIOR CERVICAL DECOMPRESSION/DISCECTOMY FUSION 1 LEVEL
Anesthesia: General | Site: Spine Cervical

## 2013-10-19 MED ORDER — GABAPENTIN 600 MG PO TABS
600.0000 mg | ORAL_TABLET | Freq: Two times a day (BID) | ORAL | Status: DC
  Administered 2013-10-19 – 2013-10-20 (×2): 600 mg via ORAL
  Filled 2013-10-19 (×2): qty 1

## 2013-10-19 MED ORDER — ALBUTEROL SULFATE (2.5 MG/3ML) 0.083% IN NEBU: 2.5000 mg | INHALATION_SOLUTION | Freq: Four times a day (QID) | RESPIRATORY_TRACT | Status: DC | PRN

## 2013-10-19 MED ORDER — MUPIROCIN 2 % EX OINT
TOPICAL_OINTMENT | CUTANEOUS | Status: AC
  Administered 2013-10-19: 1
  Filled 2013-10-19: qty 22

## 2013-10-19 MED ORDER — MENTHOL 3 MG MT LOZG: 1.0000 | LOZENGE | OROMUCOSAL | Status: DC | PRN

## 2013-10-19 MED ORDER — ACETAMINOPHEN 325 MG PO TABS: 650.0000 mg | ORAL_TABLET | ORAL | Status: DC | PRN

## 2013-10-19 MED ORDER — GLYCOPYRROLATE 0.2 MG/ML IJ SOLN
INTRAMUSCULAR | Status: DC | PRN
  Administered 2013-10-19: 0.4 mg via INTRAVENOUS

## 2013-10-19 MED ORDER — BUPIVACAINE-EPINEPHRINE (PF) 0.5% -1:200000 IJ SOLN
INTRAMUSCULAR | Status: DC | PRN
  Administered 2013-10-19: 10 mL

## 2013-10-19 MED ORDER — LACTATED RINGERS IV SOLN
INTRAVENOUS | Status: DC
  Administered 2013-10-19: 17:00:00 via INTRAVENOUS

## 2013-10-19 MED ORDER — ISOSORBIDE MONONITRATE ER 30 MG PO TB24
30.0000 mg | ORAL_TABLET | Freq: Every day | ORAL | Status: DC
  Administered 2013-10-19 – 2013-10-20 (×2): 30 mg via ORAL
  Filled 2013-10-19 (×2): qty 1

## 2013-10-19 MED ORDER — LIDOCAINE HCL (CARDIAC) 20 MG/ML IV SOLN
INTRAVENOUS | Status: DC | PRN
  Administered 2013-10-19: 60 mg via INTRAVENOUS

## 2013-10-19 MED ORDER — NEOSTIGMINE METHYLSULFATE 10 MG/10ML IV SOLN
INTRAVENOUS | Status: DC | PRN
  Administered 2013-10-19: 3 mg via INTRAVENOUS

## 2013-10-19 MED ORDER — ROCURONIUM BROMIDE 50 MG/5ML IV SOLN
INTRAVENOUS | Status: AC
  Filled 2013-10-19: qty 1

## 2013-10-19 MED ORDER — MAGNESIUM OXIDE 400 (241.3 MG) MG PO TABS
400.0000 mg | ORAL_TABLET | Freq: Two times a day (BID) | ORAL | Status: DC
  Administered 2013-10-19 – 2013-10-20 (×2): 400 mg via ORAL
  Filled 2013-10-19 (×2): qty 1

## 2013-10-19 MED ORDER — DEXAMETHASONE SODIUM PHOSPHATE 10 MG/ML IJ SOLN
INTRAMUSCULAR | Status: DC | PRN
Start: 2013-10-19 — End: 2013-10-19
  Administered 2013-10-19: 10 mg via INTRAVENOUS

## 2013-10-19 MED ORDER — CANAGLIFLOZIN 100 MG PO TABS: 300.0000 mg | ORAL_TABLET | Freq: Every day | ORAL | Status: DC

## 2013-10-19 MED ORDER — THROMBIN 5000 UNITS EX SOLR
CUTANEOUS | Status: DC | PRN
  Administered 2013-10-19 (×2): 5000 [IU] via TOPICAL

## 2013-10-19 MED ORDER — BACITRACIN ZINC 500 UNIT/GM EX OINT
TOPICAL_OINTMENT | CUTANEOUS | Status: DC | PRN
  Administered 2013-10-19: 1 via TOPICAL

## 2013-10-19 MED ORDER — ACETAMINOPHEN 160 MG/5ML PO SOLN
325.0000 mg | ORAL | Status: DC | PRN
  Filled 2013-10-19: qty 20.3

## 2013-10-19 MED ORDER — DOCUSATE SODIUM 100 MG PO CAPS
100.0000 mg | ORAL_CAPSULE | Freq: Two times a day (BID) | ORAL | Status: DC
  Administered 2013-10-19 – 2013-10-20 (×2): 100 mg via ORAL
  Filled 2013-10-19 (×2): qty 1

## 2013-10-19 MED ORDER — POTASSIUM CHLORIDE CRYS ER 10 MEQ PO TBCR
10.0000 meq | EXTENDED_RELEASE_TABLET | Freq: Every day | ORAL | Status: DC
Start: 2013-10-19 — End: 2013-10-20
  Administered 2013-10-19 – 2013-10-20 (×2): 10 meq via ORAL
  Filled 2013-10-19 (×2): qty 1

## 2013-10-19 MED ORDER — HEMOSTATIC AGENTS (NO CHARGE) OPTIME
TOPICAL | Status: DC | PRN
  Administered 2013-10-19: 1 via TOPICAL

## 2013-10-19 MED ORDER — NEOSTIGMINE METHYLSULFATE 10 MG/10ML IV SOLN
INTRAVENOUS | Status: AC
  Filled 2013-10-19: qty 1

## 2013-10-19 MED ORDER — MAGNESIUM OXIDE 400 MG PO TABS: 400.0000 mg | ORAL_TABLET | Freq: Two times a day (BID) | ORAL | Status: DC

## 2013-10-19 MED ORDER — LINAGLIPTIN 5 MG PO TABS
5.0000 mg | ORAL_TABLET | Freq: Every day | ORAL | Status: DC
  Administered 2013-10-19 – 2013-10-20 (×2): 5 mg via ORAL
  Filled 2013-10-19 (×2): qty 1

## 2013-10-19 MED ORDER — ALUM & MAG HYDROXIDE-SIMETH 200-200-20 MG/5ML PO SUSP: 30.0000 mL | Freq: Four times a day (QID) | ORAL | Status: DC | PRN

## 2013-10-19 MED ORDER — ATORVASTATIN CALCIUM 40 MG PO TABS
40.0000 mg | ORAL_TABLET | Freq: Every day | ORAL | Status: DC
  Administered 2013-10-19 – 2013-10-20 (×2): 40 mg via ORAL
  Filled 2013-10-19 (×2): qty 1

## 2013-10-19 MED ORDER — HYDROCODONE-ACETAMINOPHEN 5-325 MG PO TABS: 1.0000 | ORAL_TABLET | ORAL | Status: DC | PRN

## 2013-10-19 MED ORDER — CANAGLIFLOZIN 300 MG PO TABS: 300.0000 mg | ORAL_TABLET | Freq: Every day | ORAL | Status: DC

## 2013-10-19 MED ORDER — MIDAZOLAM HCL 2 MG/2ML IJ SOLN
INTRAMUSCULAR | Status: AC
  Filled 2013-10-19: qty 2

## 2013-10-19 MED ORDER — CANAGLIFLOZIN 300 MG PO TABS
300.0000 mg | ORAL_TABLET | Freq: Every day | ORAL | Status: DC
  Administered 2013-10-19 – 2013-10-20 (×2): 300 mg via ORAL
  Filled 2013-10-19 (×2): qty 1

## 2013-10-19 MED ORDER — BUDESONIDE-FORMOTEROL FUMARATE 160-4.5 MCG/ACT IN AERO
2.0000 | INHALATION_SPRAY | Freq: Two times a day (BID) | RESPIRATORY_TRACT | Status: DC
  Administered 2013-10-19 – 2013-10-20 (×2): 2 via RESPIRATORY_TRACT
  Filled 2013-10-19: qty 6

## 2013-10-19 MED ORDER — OXYCODONE-ACETAMINOPHEN 5-325 MG PO TABS: 1.0000 | ORAL_TABLET | ORAL | Status: DC | PRN

## 2013-10-19 MED ORDER — ACETAMINOPHEN 325 MG PO TABS: 325.0000 mg | ORAL_TABLET | ORAL | Status: DC | PRN

## 2013-10-19 MED ORDER — SODIUM CHLORIDE 0.9 % IR SOLN
Status: DC | PRN
  Administered 2013-10-19: 14:00:00

## 2013-10-19 MED ORDER — LOSARTAN POTASSIUM-HCTZ 100-25 MG PO TABS: 1.0000 | ORAL_TABLET | Freq: Every day | ORAL | Status: DC

## 2013-10-19 MED ORDER — ONDANSETRON HCL 4 MG/2ML IJ SOLN
INTRAMUSCULAR | Status: DC | PRN
  Administered 2013-10-19: 4 mg via INTRAVENOUS

## 2013-10-19 MED ORDER — LIDOCAINE HCL (CARDIAC) 20 MG/ML IV SOLN
INTRAVENOUS | Status: AC
  Filled 2013-10-19: qty 5

## 2013-10-19 MED ORDER — ONDANSETRON HCL 4 MG/2ML IJ SOLN: 4.0000 mg | INTRAMUSCULAR | Status: DC | PRN

## 2013-10-19 MED ORDER — NITROGLYCERIN 0.4 MG SL SUBL: 0.4000 mg | SUBLINGUAL_TABLET | SUBLINGUAL | Status: DC | PRN

## 2013-10-19 MED ORDER — GLIMEPIRIDE 4 MG PO TABS
4.0000 mg | ORAL_TABLET | Freq: Two times a day (BID) | ORAL | Status: DC
  Administered 2013-10-19 – 2013-10-20 (×2): 4 mg via ORAL
  Filled 2013-10-19 (×3): qty 1

## 2013-10-19 MED ORDER — MORPHINE SULFATE 2 MG/ML IJ SOLN: 1.0000 mg | INTRAMUSCULAR | Status: DC | PRN

## 2013-10-19 MED ORDER — FENTANYL CITRATE 0.05 MG/ML IJ SOLN
INTRAMUSCULAR | Status: AC
  Filled 2013-10-19: qty 5

## 2013-10-19 MED ORDER — ROCURONIUM BROMIDE 100 MG/10ML IV SOLN
INTRAVENOUS | Status: DC | PRN
  Administered 2013-10-19: 50 mg via INTRAVENOUS

## 2013-10-19 MED ORDER — DEXAMETHASONE 4 MG PO TABS
4.0000 mg | ORAL_TABLET | Freq: Four times a day (QID) | ORAL | Status: AC
  Administered 2013-10-19 – 2013-10-20 (×2): 4 mg via ORAL
  Filled 2013-10-19 (×2): qty 1

## 2013-10-19 MED ORDER — TIOTROPIUM BROMIDE MONOHYDRATE 18 MCG IN CAPS
18.0000 ug | ORAL_CAPSULE | Freq: Every day | RESPIRATORY_TRACT | Status: DC
  Administered 2013-10-20: 18 ug via RESPIRATORY_TRACT
  Filled 2013-10-19: qty 5

## 2013-10-19 MED ORDER — LOSARTAN POTASSIUM 50 MG PO TABS
100.0000 mg | ORAL_TABLET | Freq: Every day | ORAL | Status: DC
  Administered 2013-10-19 – 2013-10-20 (×2): 100 mg via ORAL
  Filled 2013-10-19 (×2): qty 2

## 2013-10-19 MED ORDER — METFORMIN HCL 500 MG PO TABS
1000.0000 mg | ORAL_TABLET | Freq: Every day | ORAL | Status: DC
  Filled 2013-10-19: qty 2

## 2013-10-19 MED ORDER — ONDANSETRON HCL 4 MG/2ML IJ SOLN: 4.0000 mg | Freq: Once | INTRAMUSCULAR | Status: DC | PRN

## 2013-10-19 MED ORDER — FENTANYL CITRATE 0.05 MG/ML IJ SOLN
INTRAMUSCULAR | Status: DC | PRN
  Administered 2013-10-19: 100 ug via INTRAVENOUS
  Administered 2013-10-19 (×2): 50 ug via INTRAVENOUS
  Administered 2013-10-19: 100 ug via INTRAVENOUS
  Administered 2013-10-19: 50 ug via INTRAVENOUS

## 2013-10-19 MED ORDER — HYDROCHLOROTHIAZIDE 25 MG PO TABS
25.0000 mg | ORAL_TABLET | Freq: Every day | ORAL | Status: DC
  Administered 2013-10-19 – 2013-10-20 (×2): 25 mg via ORAL
  Filled 2013-10-19 (×2): qty 1

## 2013-10-19 MED ORDER — ACETAMINOPHEN 650 MG RE SUPP: 650.0000 mg | RECTAL | Status: DC | PRN

## 2013-10-19 MED ORDER — LACTATED RINGERS IV SOLN
INTRAVENOUS | Status: DC
  Administered 2013-10-19: 10:00:00 via INTRAVENOUS

## 2013-10-19 MED ORDER — PANTOPRAZOLE SODIUM 40 MG PO TBEC
40.0000 mg | DELAYED_RELEASE_TABLET | Freq: Every day | ORAL | Status: DC
  Administered 2013-10-19 – 2013-10-20 (×2): 40 mg via ORAL
  Filled 2013-10-19 (×2): qty 1

## 2013-10-19 MED ORDER — PROPOFOL 10 MG/ML IV BOLUS
INTRAVENOUS | Status: DC | PRN
  Administered 2013-10-19: 140 mg via INTRAVENOUS

## 2013-10-19 MED ORDER — HYDROMORPHONE HCL PF 1 MG/ML IJ SOLN: 0.2500 mg | INTRAMUSCULAR | Status: DC | PRN

## 2013-10-19 MED ORDER — OXYCODONE HCL 5 MG PO TABS: 5.0000 mg | ORAL_TABLET | Freq: Once | ORAL | Status: DC | PRN

## 2013-10-19 MED ORDER — GLYCOPYRROLATE 0.2 MG/ML IJ SOLN
INTRAMUSCULAR | Status: AC
  Filled 2013-10-19: qty 2

## 2013-10-19 MED ORDER — CEFAZOLIN SODIUM-DEXTROSE 2-3 GM-% IV SOLR
2.0000 g | Freq: Three times a day (TID) | INTRAVENOUS | Status: AC
  Administered 2013-10-19 – 2013-10-20 (×2): 2 g via INTRAVENOUS
  Filled 2013-10-19 (×2): qty 50

## 2013-10-19 MED ORDER — MIDAZOLAM HCL 5 MG/5ML IJ SOLN
INTRAMUSCULAR | Status: DC | PRN
  Administered 2013-10-19: 1 mg via INTRAVENOUS

## 2013-10-19 MED ORDER — 0.9 % SODIUM CHLORIDE (POUR BTL) OPTIME
TOPICAL | Status: DC | PRN
  Administered 2013-10-19: 1000 mL

## 2013-10-19 MED ORDER — PROPOFOL 10 MG/ML IV BOLUS
INTRAVENOUS | Status: AC
  Filled 2013-10-19: qty 20

## 2013-10-19 MED ORDER — DIAZEPAM 5 MG PO TABS: 5.0000 mg | ORAL_TABLET | Freq: Four times a day (QID) | ORAL | Status: DC | PRN

## 2013-10-19 MED ORDER — ONDANSETRON HCL 4 MG/2ML IJ SOLN
INTRAMUSCULAR | Status: AC
  Filled 2013-10-19: qty 2

## 2013-10-19 MED ORDER — DEXAMETHASONE SODIUM PHOSPHATE 4 MG/ML IJ SOLN: 4.0000 mg | Freq: Four times a day (QID) | INTRAMUSCULAR | Status: AC

## 2013-10-19 MED ORDER — OCUVITE PO TABS
1.0000 | ORAL_TABLET | Freq: Every day | ORAL | Status: DC
  Administered 2013-10-19 – 2013-10-20 (×2): 1 via ORAL
  Filled 2013-10-19 (×2): qty 1

## 2013-10-19 MED ORDER — OXYCODONE HCL 5 MG/5ML PO SOLN: 5.0000 mg | Freq: Once | ORAL | Status: DC | PRN

## 2013-10-19 MED ORDER — PHENOL 1.4 % MT LIQD: 1.0000 | OROMUCOSAL | Status: DC | PRN

## 2013-10-19 SURGICAL SUPPLY — 62 items
BAG DECANTER FOR FLEXI CONT (MISCELLANEOUS) ×2 IMPLANT
BENZOIN TINCTURE PRP APPL 2/3 (GAUZE/BANDAGES/DRESSINGS) ×2 IMPLANT
BIT DRILL NEURO 2X3.1 SFT TUCH (MISCELLANEOUS) ×1 IMPLANT
BLADE 10 SAFETY STRL DISP (BLADE) IMPLANT
BLADE SURG 15 STRL LF DISP TIS (BLADE) ×1 IMPLANT
BLADE SURG 15 STRL SS (BLADE) ×1
BLADE ULTRA TIP 2M (BLADE) ×2 IMPLANT
BRUSH SCRUB EZ PLAIN DRY (MISCELLANEOUS) ×2 IMPLANT
BUR BARREL STRAIGHT FLUTE 4.0 (BURR) ×2 IMPLANT
BUR MATCHSTICK NEURO 3.0 LAGG (BURR) ×2 IMPLANT
CANISTER SUCT 3000ML (MISCELLANEOUS) ×2 IMPLANT
CONT SPEC 4OZ CLIKSEAL STRL BL (MISCELLANEOUS) ×2 IMPLANT
COVER MAYO STAND STRL (DRAPES) ×2 IMPLANT
DEVICE FUSION VIST S 14X14X6MM (Trauma) ×1 IMPLANT
DRAPE LAPAROTOMY 100X72 PEDS (DRAPES) ×2 IMPLANT
DRAPE MICROSCOPE LEICA (MISCELLANEOUS) IMPLANT
DRAPE POUCH INSTRU U-SHP 10X18 (DRAPES) ×2 IMPLANT
DRAPE SURG 17X23 STRL (DRAPES) ×4 IMPLANT
DRILL NEURO 2X3.1 SOFT TOUCH (MISCELLANEOUS) ×2
ELECT REM PT RETURN 9FT ADLT (ELECTROSURGICAL) ×2
ELECTRODE REM PT RTRN 9FT ADLT (ELECTROSURGICAL) ×1 IMPLANT
GAUZE SPONGE 4X4 16PLY XRAY LF (GAUZE/BANDAGES/DRESSINGS) IMPLANT
GLOVE BIO SURGEON STRL SZ 6.5 (GLOVE) ×2 IMPLANT
GLOVE BIO SURGEON STRL SZ8.5 (GLOVE) ×2 IMPLANT
GLOVE BIOGEL PI IND STRL 7.0 (GLOVE) ×1 IMPLANT
GLOVE BIOGEL PI INDICATOR 7.0 (GLOVE) ×1
GLOVE EXAM NITRILE LRG STRL (GLOVE) IMPLANT
GLOVE EXAM NITRILE MD LF STRL (GLOVE) IMPLANT
GLOVE EXAM NITRILE XL STR (GLOVE) IMPLANT
GLOVE EXAM NITRILE XS STR PU (GLOVE) IMPLANT
GLOVE SS BIOGEL STRL SZ 8 (GLOVE) ×1 IMPLANT
GLOVE SUPERSENSE BIOGEL SZ 8 (GLOVE) ×1
GLOVE SURG SS PI 7.0 STRL IVOR (GLOVE) ×6 IMPLANT
GOWN STRL REUS W/ TWL LRG LVL3 (GOWN DISPOSABLE) ×2 IMPLANT
GOWN STRL REUS W/ TWL XL LVL3 (GOWN DISPOSABLE) ×1 IMPLANT
GOWN STRL REUS W/TWL LRG LVL3 (GOWN DISPOSABLE) ×2
GOWN STRL REUS W/TWL XL LVL3 (GOWN DISPOSABLE) ×1
KIT BASIN OR (CUSTOM PROCEDURE TRAY) ×2 IMPLANT
KIT ROOM TURNOVER OR (KITS) ×2 IMPLANT
MARKER SKIN DUAL TIP RULER LAB (MISCELLANEOUS) ×2 IMPLANT
NEEDLE HYPO 22GX1.5 SAFETY (NEEDLE) ×2 IMPLANT
NEEDLE SPNL 18GX3.5 QUINCKE PK (NEEDLE) ×2 IMPLANT
NS IRRIG 1000ML POUR BTL (IV SOLUTION) ×2 IMPLANT
PACK LAMINECTOMY NEURO (CUSTOM PROCEDURE TRAY) ×2 IMPLANT
PIN DISTRACTION 14MM (PIN) ×4 IMPLANT
PLATE ANT CERV XTEND 1 LV 14 (Plate) ×2 IMPLANT
PUTTY 2.5ML ACTIFUSE ABX (Putty) ×2 IMPLANT
RUBBERBAND STERILE (MISCELLANEOUS) IMPLANT
SCREW XTD VAR 4.2 SELF TAP 12 (Screw) ×8 IMPLANT
SPONGE GAUZE 4X4 12PLY (GAUZE/BANDAGES/DRESSINGS) ×2 IMPLANT
SPONGE INTESTINAL PEANUT (DISPOSABLE) ×4 IMPLANT
SPONGE SURGIFOAM ABS GEL SZ50 (HEMOSTASIS) ×2 IMPLANT
STRIP CLOSURE SKIN 1/2X4 (GAUZE/BANDAGES/DRESSINGS) ×2 IMPLANT
SUT VIC AB 0 CT1 27 (SUTURE) ×1
SUT VIC AB 0 CT1 27XBRD ANTBC (SUTURE) ×1 IMPLANT
SUT VIC AB 3-0 SH 8-18 (SUTURE) ×2 IMPLANT
SYR 20ML ECCENTRIC (SYRINGE) ×2 IMPLANT
TAPE CLOTH SURG 4X10 WHT LF (GAUZE/BANDAGES/DRESSINGS) ×2 IMPLANT
TOWEL OR 17X24 6PK STRL BLUE (TOWEL DISPOSABLE) ×2 IMPLANT
TOWEL OR 17X26 10 PK STRL BLUE (TOWEL DISPOSABLE) ×2 IMPLANT
VISTA S O 14X14X6MM (Trauma) ×2 IMPLANT
WATER STERILE IRR 1000ML POUR (IV SOLUTION) ×2 IMPLANT

## 2013-10-19 NOTE — Transfer of Care (Signed)
Immediate Anesthesia Transfer of Care Note  Patient: [name] [name]  Procedure(s) Performed: Procedure(s): CERVICAL FIVE-SIX ANTERIOR CERVICAL DECOMPRESSION WITH FUSION INTERBODY PROSTHESIS PLATING AND PEEK CAGE (N/A)  Patient Location: PACU  Anesthesia Type:General  Level of Consciousness: sedated, patient cooperative and responds to stimulation  Airway & Oxygen Therapy: Patient Spontanous Breathing and Patient connected to nasal cannula oxygen  Post-op Assessment: Report given to PACU RN, Post -op Vital signs reviewed and stable and Patient moving all extremities  Post vital signs: Reviewed and stable  Complications: No apparent anesthesia complications

## 2013-10-19 NOTE — Anesthesia Procedure Notes (Signed)
Procedure Name: Intubation Date/Time: 10/19/2013 12:30 PM Performed by: Carola Frost Pre-anesthesia Checklist: Patient identified, Timeout performed, Emergency Drugs available, Suction available and Patient being monitored Patient Re-evaluated:Patient Re-evaluated prior to inductionOxygen Delivery Method: Circle system utilized Preoxygenation: Pre-oxygenation with 100% oxygen Intubation Type: IV induction Ventilation: Mask ventilation without difficulty Laryngoscope Size: 3 and Mac Grade View: Grade I Tube type: Oral Tube size: 7.0 mm Number of attempts: 1 Airway Equipment and Method: Stylet Placement Confirmation: CO2 detector,  positive ETCO2,  ETT inserted through vocal cords under direct vision and breath sounds checked- equal and bilateral Secured at: 22 cm Tube secured with: Tape Dental Injury: Teeth and Oropharynx as per pre-operative assessment

## 2013-10-19 NOTE — Op Note (Signed)
Brief history: The patient is a 73 year old white female who is complaining of neck and arm pain consistent with a cervical radiculopathy. She has failed medical management and was worked up with a cervical MRI. This demonstrated this degeneration and spondylosis. We discussed the various treatment options including surgery. She has weighed the risks, benefits, and alternatives surgery and decided proceed with a C5-6 anterior cervical discectomy, fusion, and plating.  Preoperative diagnosis: C5-6 disc degeneration, spondylosis, stenosis, cervicalgia, cervical radiculopathy  Postoperative diagnosis: The same  Procedure: C5-6 Anterior cervical discectomy/decompression; C5-6 interbody arthrodesis with local morcellized autograft bone and Actifuse bone graft extender; insertion of interbody prosthesis at C5-6 (Zimmer peek interbody prosthesis); anterior cervical plating from C5-6 with globus titanium plate  Surgeon: Dr. Earle Gell  Asst.: None  Anesthesia: Gen. endotracheal  Estimated blood loss: Minimal  Drains: None  Complications: None  Description of procedure: The patient was brought to the operating room by the anesthesia team. General endotracheal anesthesia was induced. A roll was placed under the patient's shoulders to keep the neck in the neutral position. The patient's anterior cervical region was then prepared with Betadine scrub and Betadine solution. Sterile drapes were applied.  The area to be incised was then injected with Marcaine with epinephrine solution. I then used a scalpel to make a transverse incision in the patient's left anterior neck. I used the Metzenbaum scissors to divide the platysmal muscle and then to dissect medial to the sternocleidomastoid muscle, jugular vein, and carotid artery. I carefully dissected down towards the anterior cervical spine identifying the esophagus and retracting it medially. Then using Kitner swabs to clear soft tissue from the anterior  cervical spine. We then inserted a bent spinal needle into the upper exposed intervertebral disc space. We then obtained intraoperative radiographs confirm our location.  I then used electrocautery to detach the medial border of the longus colli muscle bilaterally from the C5-6 intervertebral disc spaces. I then inserted the Caspar self-retaining retractor underneath the longus colli muscle bilaterally to provide exposure.  We then incised the intervertebral disc at C5-6. We then performed a partial intervertebral discectomy with a pituitary forceps and the Karlin curettes. I then inserted distraction screws into the vertebral bodies at C5-6. We then distracted the interspace. We then used the high-speed drill to decorticate the vertebral endplates at F6-4, to drill away the remainder of the intervertebral disc, to drill away some posterior spondylosis, and to thin out the posterior longitudinal ligament. I then incised ligament with the arachnoid knife. We then removed the ligament with a Kerrison punches undercutting the vertebral endplates and decompressing the thecal sac. We then performed foraminotomies about the bilateral C6 nerve roots. This completed the decompression at this level.   We now turned our to attention to the interbody fusion. We used the trial spacers to determine the appropriate size for the interbody prosthesis. We then pre-filled prosthesis with a combination of local morcellized autograft bone that we obtained during decompression as well as Actifuse bone graft extender. We then inserted the prosthesis into the distracted interspace at C5-6. We then removed the distraction screws. There was a good snug fit of the prosthesis in the interspace.   Having completed the fusion we now turned attention to the anterior spinal instrumentation. We used the high-speed drill to drill away some anterior spondylosis at the disc spaces so that the plate lay down flat. We selected the appropriate  length titanium anterior cervical plate. We laid it along the anterior aspect of the  vertebral bodies from C5-6. We then drilled 12 mm holes at C5 and C6. We then secured the plate to the vertebral bodies by placing two 12 mm self-tapping screws at C5 and C6. We then obtained intraoperative radiograph. The demonstrating good position of the instrumentation. We therefore secured the screws the plate the locking each cam. This completed the instrumentation.  We then obtained hemostasis using bipolar electrocautery. We irrigated the wound out with bacitracin solution. We then removed the retractor. We inspected the esophagus for any damage. There was none apparent. We then reapproximated patient's platysmal muscle with interrupted 3-0 Vicryl suture. We then reapproximated the subcutaneous tissue with interrupted 3-0 Vicryl suture. The skin was reapproximated with Steri-Strips and benzoin. The wound was then covered with bacitracin ointment. A sterile dressing was applied. The drapes were removed. Patient was subsequently extubated by the anesthesia team and transported to the post anesthesia care unit in stable condition. All sponge instrument and needle counts were reportedly correct at the end of this case.

## 2013-10-19 NOTE — Anesthesia Preprocedure Evaluation (Signed)
Anesthesia Evaluation  Patient identified by MRN, date of birth, ID band Patient awake    Reviewed: Allergy & Precautions, H&P , NPO status , Patient's Chart, lab work & pertinent test results  Airway Mallampati: II TM Distance: >3 FB Neck ROM: Full    Dental  (+) Edentulous Upper, Edentulous Lower   Pulmonary shortness of breath and with exertion, neg sleep apnea, COPD COPD inhaler, neg recent URI, Current Smoker,    + decreased breath sounds      Cardiovascular hypertension, Pt. on medications - angina+ Peripheral Vascular Disease - CAD, - Past MI and - CHF - dysrhythmias - Valvular Problems/MurmursRhythm:Regular     Neuro/Psych Chronic neck pain with left arm radiculopathy CVA negative psych ROS   GI/Hepatic negative GI ROS, Neg liver ROS,   Endo/Other  diabetes, Type 2, Oral Hypoglycemic Agents  Renal/GU negative Renal ROS     Musculoskeletal   Abdominal   Peds  Hematology negative hematology ROS (+)   Anesthesia Other Findings   Reproductive/Obstetrics                           Anesthesia Physical Anesthesia Plan  ASA: III  Anesthesia Plan: General   Post-op Pain Management:    Induction: Intravenous  Airway Management Planned: Oral ETT  Additional Equipment: None  Intra-op Plan:   Post-operative Plan: Extubation in OR  Informed Consent: I have reviewed the patients History and Physical, chart, labs and discussed the procedure including the risks, benefits and alternatives for the proposed anesthesia with the patient or authorized representative who has indicated his/her understanding and acceptance.     Plan Discussed with: CRNA and Surgeon  Anesthesia Plan Comments:         Anesthesia Quick Evaluation

## 2013-10-19 NOTE — H&P (Signed)
Subjective: The patient is a 73 year old white female who has complained of neck and arm pain consistent with a cervical radiculopathy. She has failed medical management and was worked up with a cervical MRI. This demonstrated disc degeneration and spondylosis at C5-C6. I discussed the various treatment option with the patient including surgery. She has weighed the risks, benefits, and alternatives surgery and decided proceed with a C5-6 anterior cervical discectomy, fusion, and plating.   Past Medical History  Diagnosis Date  . CAD (coronary artery disease)     Minimal nonobstructive, cath 05/2004  . Hypertension   . Diabetes mellitus   . Hyperlipemia     Followed by Dr. Rosanna Randy  . Stroke     6 yrs ago  . COPD (chronic obstructive pulmonary disease)   . Shortness of breath   . Pneumonia     hx  . Macular degeneration     rt    Past Surgical History  Procedure Laterality Date  . Cardiac catheterization  05/2004  . Cholecystectomy    . Vesicovaginal fistula closure w/ tah    . Cataract extraction Left   . Back surgery  80's    Allergies  Allergen Reactions  . Coconut Fatty Acids Swelling    Throat swells    History  Substance Use Topics  . Smoking status: Current Every Day Smoker -- 0.50 packs/day for 54 years    Types: Cigarettes  . Smokeless tobacco: Never Used  . Alcohol Use: No    Family History  Problem Relation Age of Onset  . Stroke Mother     Massive  . Heart attack Father     Massive  . Heart attack Brother   . Lung cancer Maternal Grandfather   . Heart attack Paternal Grandmother     MI  . Heart attack Brother    Prior to Admission medications   Medication Sig Start Date End Date Taking? Authorizing Provider  albuterol (PROVENTIL) (2.5 MG/3ML) 0.083% nebulizer solution Take 3 mLs (2.5 mg total) by nebulization every 6 (six) hours as needed for shortness of breath. DX Code: COPD 496 08/03/13 08/03/14 Yes Kathee Delton, MD  aspirin 81 MG tablet Take 81  mg by mouth daily.   Yes Historical Provider, MD  atorvastatin (LIPITOR) 40 MG tablet Take 1 tablet (40 mg total) by mouth daily. 02/16/13  Yes [name], MD  beta carotene w/minerals (OCUVITE) tablet Take 1 tablet by mouth daily.   Yes Historical Provider, MD  budesonide-formoterol (SYMBICORT) 160-4.5 MCG/ACT inhaler Inhale 2 puffs into the lungs 2 (two) times daily. 12/10/10  Yes Kathee Delton, MD  Canagliflozin 300 MG TABS Take 300 mg by mouth daily.   Yes Historical Provider, MD  cholecalciferol (VITAMIN D) 1000 UNITS tablet Take 1,000 Units by mouth daily.   Yes Historical Provider, MD  gabapentin (NEURONTIN) 600 MG tablet Take 600 mg by mouth 2 (two) times daily.    Yes Historical Provider, MD  glimepiride (AMARYL) 4 MG tablet Take 4 mg by mouth 2 (two) times daily.    Yes Historical Provider, MD  HYDROcodone-acetaminophen (NORCO/VICODIN) 5-325 MG per tablet Take 1 tablet by mouth every 6 (six) hours as needed for moderate pain.   Yes Historical Provider, MD  isosorbide mononitrate (IMDUR) 30 MG 24 hr tablet Take 1 tablet (30 mg total) by mouth daily. 08/25/13  Yes [name], MD  losartan-hydrochlorothiazide (HYZAAR) 100-25 MG per tablet Take 1 tablet by mouth daily.   Yes Historical Provider,  MD  magnesium oxide (MAG-OX) 400 MG tablet Take 1 tablet by mouth 2 (two) times daily.    Yes Historical Provider, MD  metFORMIN (GLUCOPHAGE) 1000 MG tablet Take 1,000 mg by mouth daily.    Yes Historical Provider, MD  Multiple Vitamins-Minerals (CENTRUM SILVER PO) Take 1 tablet by mouth daily.     Yes Historical Provider, MD  nitroGLYCERIN (NITROSTAT) 0.4 MG SL tablet Place 1 tablet (0.4 mg total) under the tongue every 5 (five) minutes as needed for chest pain. 02/16/13  Yes [name], MD  omeprazole (PRILOSEC) 20 MG capsule Take 20 mg by mouth daily.     Yes Historical Provider, MD  oxyCODONE-acetaminophen (PERCOCET) 5-325 MG per tablet Take 1 tablet by mouth every 4 (four) hours as  needed for moderate pain.    Yes Historical Provider, MD  potassium chloride (KLOR-CON) 10 MEQ CR tablet Take 10 mEq by mouth 3 (three) times daily.     Yes Historical Provider, MD  saxagliptin HCl (ONGLYZA) 5 MG TABS tablet Take 5 mg by mouth daily.     Yes Historical Provider, MD  tiotropium (SPIRIVA) 18 MCG inhalation capsule Place 18 mcg into inhaler and inhale daily.   Yes Historical Provider, MD  diazepam (VALIUM) 5 MG tablet Take 5 mg by mouth daily as needed for muscle spasms.     Historical Provider, MD  glucose blood test strip as directed. 06/15/11   Historical Provider, MD  [place] as directed. 06/15/11   Historical Provider, MD     Review of Systems  Positive ROS: As above  All other systems have been reviewed and were otherwise negative with the exception of those mentioned in the HPI and as above.  Objective: Vital signs in last 24 hours: Temp:  [98.4 F (36.9 C)] 98.4 F (36.9 C) (07/16 0930) Pulse Rate:  [67] 67 (07/16 0930) Resp:  [18] 18 (07/16 0930) BP: (131)/(105) 131/105 mmHg (07/16 0930) SpO2:  [96 %] 96 % (07/16 0930)  General Appearance: Alert, cooperative, no distress, Head: Normocephalic, without obvious abnormality, atraumatic Eyes: PERRL, conjunctiva/corneas clear, EOM's intact,    Ears: Normal  Throat: Normal  Neck: Supple, symmetrical, trachea midline, no adenopathy; thyroid: No enlargement/tenderness/nodules; no carotid bruit or JVD Back: Symmetric, no curvature, ROM normal, no CVA tenderness Lungs: Clear to auscultation bilaterally, respirations unlabored Heart: Regular rate and rhythm, no murmur, rub or gallop Abdomen: Soft, non-tender,, no masses, no organomegaly Extremities: Extremities normal, atraumatic, no cyanosis or edema Pulses: 2+ and symmetric all extremities Skin: Skin color, texture, turgor normal, no rashes or lesions  NEUROLOGIC:   Mental status: alert and oriented, no aphasia, good attention span, Fund of  knowledge/ memory ok Motor Exam - grossly normal Sensory Exam - grossly normal Reflexes:  Coordination - grossly normal Gait - grossly normal Balance - grossly normal Cranial Nerves: I: smell Not tested  II: visual acuity  OS: Normal  OD: Normal   II: visual fields Full to confrontation  II: pupils Equal, round, reactive to light  III,VII: ptosis None  III,IV,VI: extraocular muscles  Full ROM  V: mastication Normal  V: facial light touch sensation  Normal  V,VII: corneal reflex  Present  VII: facial muscle function - upper  Normal  VII: facial muscle function - lower Normal  VIII: hearing Not tested  IX: soft palate elevation  Normal  IX,X: gag reflex Present  XI: trapezius strength  5/5  XI: sternocleidomastoid strength 5/5  XI: neck flexion strength  5/5  XII: tongue strength  Normal    Data Review Lab Results  Component Value Date   WBC 11.4* 10/16/2013   HGB 13.9 10/16/2013   HCT 41.9 10/16/2013   MCV 93.9 10/16/2013   PLT 261 10/16/2013   Lab Results  Component Value Date   NA 140 10/16/2013   K 4.1 10/16/2013   CL 98 10/16/2013   CO2 28 10/16/2013   BUN 15 10/16/2013   CREATININE 0.84 10/16/2013   GLUCOSE 72 10/16/2013   Lab Results  Component Value Date   INR 0.9 08/15/2013    Assessment/Plan: C5-6 disc degeneration, spondylosis, stenosis, cervicalgia, cervical radiculopathy: I discussed situation with the patient. I have reviewed her imaging studies with her and pointed out the abnormalities. We have discussed the various treatment option including surgery. I have described the surgical treatment option of a C5-6 anterior cervical discectomy, fusion, and plating. I've shown her surgical models. We have discussed the risks, benefits, alternatives, and likelihood of achieving our goals with surgery. I've answered all the patient's questions. She wants to proceed with surgery.   Vanesa Renier D 10/19/2013 11:50 AM

## 2013-10-19 NOTE — Progress Notes (Signed)
Pt arrived to floor via stretcher from pacu at 1615. Pt MAE x4, on 3L oxygen; IV remains intact and infusing. Pt oriented to the room and unit; call light within reach. Pain assessed; placed on Contact precautions d/t positive PCR for MRSA; pt belongings and daughter remains at bedside. Pt in bed comfortably with call light within reach. Reported off to incoming RN.

## 2013-10-19 NOTE — Progress Notes (Signed)
Subjective:  The patient is alert and pleasant. She looks well. She is in no apparent distress.  Objective: Vital signs in last 24 hours: Temp:  [97.9 F (36.6 C)-98.4 F (36.9 C)] 97.9 F (36.6 C) (07/16 1430) Pulse Rate:  [67-82] 82 (07/16 1445) Resp:  [18-24] 18 (07/16 1445) BP: (131-151)/(50-105) 151/63 mmHg (07/16 1445) SpO2:  [83 %-96 %] 90 % (07/16 1445)  Intake/Output from previous day:   Intake/Output this shift: Total I/O In: 950 [I.V.:950] Out: -   Physical exam patient is alert and pleasant. She is moving all 4 extremities well. Her dressing is clean and dry. There is no evidence of hematoma or shift.  Lab Results: No results found for this basename: WBC, HGB, HCT, PLT,  in the last 72 hours BMET No results found for this basename: NA, K, CL, CO2, GLUCOSE, BUN, CREATININE, CALCIUM,  in the last 72 hours  Studies/Results: Dg Cervical Spine 2-3 Views  10/19/2013   CLINICAL DATA:  Cervical fusion  EXAM: CERVICAL SPINE - 2-3 VIEW  COMPARISON:  None.  FINDINGS: The metallic marking device overlies the anterior aspect of the C3-4 disc. The vertebral bodies are preserved in height where visualized. The trachea and esophagus are intubated. The subsequent image demonstrates radiodense sponges anterior to the C3-4 disc and anterior to the C5-6 anterior fusion plate.  IMPRESSION: Findings as described above. There is no immediate postsurgical complication.   Electronically Signed   By: David  Martinique   On: 10/19/2013 14:48    Assessment/Plan: The patient is doing well. I spoke with her family.  LOS: 0 days     [name] [name] D 10/19/2013, 3:00 PM

## 2013-10-20 ENCOUNTER — Encounter (HOSPITAL_COMMUNITY): Payer: Self-pay | Admitting: Neurosurgery

## 2013-10-20 MED ORDER — DSS 100 MG PO CAPS: 100.0000 mg | ORAL_CAPSULE | Freq: Two times a day (BID) | ORAL | Status: DC

## 2013-10-20 MED ORDER — DIAZEPAM 5 MG PO TABS: 5.0000 mg | ORAL_TABLET | Freq: Four times a day (QID) | ORAL | Status: DC | PRN

## 2013-10-20 MED ORDER — OXYCODONE-ACETAMINOPHEN 5-325 MG PO TABS: 1.0000 | ORAL_TABLET | ORAL | Status: DC | PRN

## 2013-10-20 NOTE — Care Management Note (Addendum)
  Page 1 of 1   10/20/2013     11:11:16 AM CARE MANAGEMENT NOTE 10/20/2013  Patient:  [name] [name], [name] [name]   Account Number:  [id number]  Date Initiated:  10/20/2013  Documentation initiated by:  Lorne Skeens  Subjective/Objective Assessment:   Patient was admitted for ACDF. Lives at home with spouse.     Action/Plan:   Will follow for discharge needs pending PT/OT evals and physician orders.   Anticipated DC Date:  10/20/2013   Anticipated DC Plan:  HOME/SELF CARE         Choice offered to / List presented to:             Status of service:  Completed, signed off Medicare Important Message given?  NA - LOS <3 / Initial given by admissions (If response is "NO", the following Medicare IM given date fields will be blank) Date Medicare IM given:   Medicare IM given by:   Date Additional Medicare IM given:   Additional Medicare IM given by:    Discharge Disposition:    Per UR Regulation:    If discussed at Long Length of Stay Meetings, dates discussed:    Comments:

## 2013-10-20 NOTE — Discharge Summary (Signed)
Physician Discharge Summary  Patient ID: [name] [name] MRN: [id number] DOB/AGE: 72-Oct-1942 72 y.o.  Admit date: 10/19/2013 Discharge date: 10/20/2013  Admission Diagnoses: C5-6 disc degeneration, spondylosis, cervicalgia, cervical radiculopathy  Discharge Diagnoses: The same Active Problems:   Cervical spondylosis with radiculopathy   Discharged Condition: good  Hospital Course: I performed a C5-6 anterior cervical discectomy, fusion, and plating on the patient on 10/19/2013. The surgery went well.  The patient's postoperative course was unremarkable. On postoperative day #1 she requested discharge to home. The patient was given oral and written discharge instructions. All her questions were answered.  Consults: None Significant Diagnostic Studies: None Treatments: C5-C6 anterior cervical discectomy, fusion, and plating. Discharge Exam: Blood pressure 117/44, pulse 76, temperature 98.3 F (36.8 C), temperature source Oral, resp. rate 18, SpO2 91.00%. Patient is alert and pleasant. She looks well. Her dressing has a small bloodstain. There is no evidence of hematoma or shift. Her strength is normal.  Disposition: Home  Discharge Instructions   Call MD for:  difficulty breathing, headache or visual disturbances    Complete by:  As directed      Call MD for:  extreme fatigue    Complete by:  As directed      Call MD for:  hives    Complete by:  As directed      Call MD for:  persistant dizziness or light-headedness    Complete by:  As directed      Call MD for:  persistant nausea and vomiting    Complete by:  As directed      Call MD for:  redness, tenderness, or signs of infection (pain, swelling, redness, odor or green/yellow discharge around incision site)    Complete by:  As directed      Call MD for:  severe uncontrolled pain    Complete by:  As directed      Call MD for:  temperature >100.4    Complete by:  As directed      Diet - low sodium heart healthy     Complete by:  As directed      Discharge instructions    Complete by:  As directed   Call [phone] for a followup appointment. Take a stool softener while you are using pain medications.     Driving Restrictions    Complete by:  As directed   Do not drive for 2 weeks.     Increase activity slowly    Complete by:  As directed      Lifting restrictions    Complete by:  As directed   Do not lift more than 5 pounds. No excessive bending or twisting.     May shower / Bathe    Complete by:  As directed   He may shower after the pain she is removed 3 days after surgery. Leave the incision alone.     No dressing needed    Complete by:  As directed             Medication List    STOP taking these medications       HYDROcodone-acetaminophen 5-325 MG per tablet  Commonly known as:  NORCO/VICODIN      TAKE these medications       albuterol (2.5 MG/3ML) 0.083% nebulizer solution  Commonly known as:  PROVENTIL  Take 3 mLs (2.5 mg total) by nebulization every 6 (six) hours as needed for shortness of breath. DX Code: COPD 9  aspirin 81 MG tablet  Take 81 mg by mouth daily.     atorvastatin 40 MG tablet  Commonly known as:  LIPITOR  Take 1 tablet (40 mg total) by mouth daily.     budesonide-formoterol 160-4.5 MCG/ACT inhaler  Commonly known as:  SYMBICORT  Inhale 2 puffs into the lungs 2 (two) times daily.     Canagliflozin 300 MG Tabs  Take 300 mg by mouth daily.     CENTRUM SILVER PO  Take 1 tablet by mouth daily.     beta carotene w/minerals tablet  Take 1 tablet by mouth daily.     cholecalciferol 1000 UNITS tablet  Commonly known as:  VITAMIN D  Take 1,000 Units by mouth daily.     diazepam 5 MG tablet  Commonly known as:  VALIUM  Take 5 mg by mouth daily as needed for muscle spasms.     diazepam 5 MG tablet  Commonly known as:  VALIUM  Take 1 tablet (5 mg total) by mouth every 6 (six) hours as needed for muscle spasms.     DSS 100 MG Caps  Take 100  mg by mouth 2 (two) times daily.     gabapentin 600 MG tablet  Commonly known as:  NEURONTIN  Take 600 mg by mouth 2 (two) times daily.     glimepiride 4 MG tablet  Commonly known as:  AMARYL  Take 4 mg by mouth 2 (two) times daily.     glucose blood test strip  as directed.     isosorbide mononitrate 30 MG 24 hr tablet  Commonly known as:  IMDUR  Take 1 tablet (30 mg total) by mouth daily.     LANCETS ULTRA THIN Misc  as directed.     losartan-hydrochlorothiazide 100-25 MG per tablet  Commonly known as:  HYZAAR  Take 1 tablet by mouth daily.     magnesium oxide 400 MG tablet  Commonly known as:  MAG-OX  Take 1 tablet by mouth 2 (two) times daily.     metFORMIN 1000 MG tablet  Commonly known as:  GLUCOPHAGE  Take 1,000 mg by mouth daily.     nitroGLYCERIN 0.4 MG SL tablet  Commonly known as:  NITROSTAT  Place 1 tablet (0.4 mg total) under the tongue every 5 (five) minutes as needed for chest pain.     omeprazole 20 MG capsule  Commonly known as:  PRILOSEC  Take 20 mg by mouth daily.     ONGLYZA 5 MG Tabs tablet  Generic drug:  saxagliptin HCl  Take 5 mg by mouth daily.     oxyCODONE-acetaminophen 5-325 MG per tablet  Commonly known as:  PERCOCET/ROXICET  Take 1 tablet by mouth every 4 (four) hours as needed for moderate pain.     oxyCODONE-acetaminophen 5-325 MG per tablet  Commonly known as:  PERCOCET/ROXICET  Take 1-2 tablets by mouth every 4 (four) hours as needed for moderate pain.     potassium chloride 10 MEQ CR tablet  Commonly known as:  KLOR-CON  Take 10 mEq by mouth 3 (three) times daily.     tiotropium 18 MCG inhalation capsule  Commonly known as:  SPIRIVA  Place 18 mcg into inhaler and inhale daily.         SignedNewman Pies D 10/20/2013, 8:02 AM

## 2013-10-20 NOTE — Anesthesia Postprocedure Evaluation (Signed)
  Anesthesia Post-op Note  Patient: [name] [name]  Procedure(s) Performed: Procedure(s): CERVICAL FIVE-SIX ANTERIOR CERVICAL DECOMPRESSION WITH FUSION INTERBODY PROSTHESIS PLATING AND PEEK CAGE (N/A)  Patient Location: PACU  Anesthesia Type:General  Level of Consciousness: awake and alert   Airway and Oxygen Therapy: Patient Spontanous Breathing  Post-op Pain: mild  Post-op Assessment: Post-op Vital signs reviewed, Patient's Cardiovascular Status Stable, Respiratory Function Stable, Patent Airway, No signs of Nausea or vomiting and Pain level controlled  Post-op Vital Signs: Reviewed and stable  Last Vitals:  Filed Vitals:   10/20/13 0958  BP: 113/47  Pulse: 78  Temp: 37.1 C  Resp: 18    Complications: No apparent anesthesia complications

## 2013-10-20 NOTE — Progress Notes (Signed)
Pt A&O x4; pt discharge education and instructions completed with pt and family at bedside. All voices understanding and denies any questions. Pt IV removed; dsg changed as ordered; pt handed her prescription for valium and percocet; pt given her soft collar and inhalers. Pt transported off unit via wheelchair with family members and belongings at side. Pt discharge home and family to transport pt off to disposition. Francis Gaines Morine Kohlman RN.

## 2013-11-27 ENCOUNTER — Encounter: Payer: Self-pay | Admitting: Pulmonary Disease

## 2013-11-27 ENCOUNTER — Ambulatory Visit (INDEPENDENT_AMBULATORY_CARE_PROVIDER_SITE_OTHER): Payer: Medicare Other | Admitting: Pulmonary Disease

## 2013-11-27 VITALS — BP 110/80 | HR 76 | Temp 97.9°F | Ht 64.0 in | Wt 152.0 lb

## 2013-11-27 DIAGNOSIS — J449 Chronic obstructive pulmonary disease, unspecified: Secondary | ICD-10-CM

## 2013-11-27 MED ORDER — TIOTROPIUM BROMIDE MONOHYDRATE 2.5 MCG/ACT IN AERS: 2.0000 | INHALATION_SPRAY | Freq: Every day | RESPIRATORY_TRACT | Status: DC

## 2013-11-27 NOTE — Patient Instructions (Addendum)
Will send in a prescription for your spiriva respimat to take in the place of your spiriva handihaler.  Take 2 inhalations each am.  Stay on symbicort as you are doing. Stop smoking. followup again in 8mos

## 2013-11-27 NOTE — Progress Notes (Signed)
   Subjective:    Patient ID: [name] [name], female    DOB: 1940/11/26, 73 y.o.   MRN: [id number]  HPI Patient comes in today for followup of her known COPD. Unfortunately, she continues to smoke, but has not had an acute exacerbation since last visit. He has maintained on her bronchodilator regimen, and has minimal cough or mucus. She has had cervical spine surgery since the last visit and came through the procedure fairly well.   Review of Systems  Constitutional: Negative for fever and unexpected weight change.  HENT: Negative for congestion, dental problem, ear pain, nosebleeds, postnasal drip, rhinorrhea, sinus pressure, sneezing, sore throat and trouble swallowing.   Eyes: Negative for redness and itching.  Respiratory: Positive for cough and shortness of breath. Negative for chest tightness and wheezing.   Cardiovascular: Negative for palpitations and leg swelling.  Gastrointestinal: Negative for nausea and vomiting.  Genitourinary: Negative for dysuria.  Musculoskeletal: Negative for joint swelling.  Skin: Negative for rash.  Neurological: Negative for headaches.  Hematological: Does not bruise/bleed easily.  Psychiatric/Behavioral: Negative for dysphoric mood. The patient is not nervous/anxious.        Objective:   Physical Exam Well-developed female in no acute distress Nose without purulence or discharge noted Neck without lymphadenopathy or thyromegaly Chest with mildly decreased breath sounds, no wheezing Heart exam with regular rate and rhythm Lower extremities without edema, no cyanosis Alert and oriented, moves all 4 extremities       Assessment & Plan:

## 2013-11-27 NOTE — Assessment & Plan Note (Signed)
The patient is stable from a COPD standpoint, but unfortunately continues to smoke. She is on a good bronchodilator regimen, and I've asked her to continue with this. I have stressed to her the importance of total smoking cessation, as well as some type of conditioning program.

## 2013-12-28 ENCOUNTER — Ambulatory Visit: Payer: Self-pay | Admitting: Family Medicine

## 2014-03-28 ENCOUNTER — Encounter: Payer: Self-pay | Admitting: Pulmonary Disease

## 2014-03-28 ENCOUNTER — Ambulatory Visit (INDEPENDENT_AMBULATORY_CARE_PROVIDER_SITE_OTHER): Payer: Medicare Other | Admitting: Pulmonary Disease

## 2014-03-28 VITALS — BP 136/72 | HR 82 | Temp 97.0°F | Ht 64.0 in | Wt 156.4 lb

## 2014-03-28 DIAGNOSIS — J438 Other emphysema: Secondary | ICD-10-CM

## 2014-03-28 MED ORDER — BUDESONIDE-FORMOTEROL FUMARATE 160-4.5 MCG/ACT IN AERO: 2.0000 | INHALATION_SPRAY | Freq: Two times a day (BID) | RESPIRATORY_TRACT | Status: DC

## 2014-03-28 NOTE — Progress Notes (Signed)
   Subjective:    Patient ID: [name] [name], female    DOB: 1941-03-03, 73 y.o.   MRN: [id number]  HPI Patient comes in today for follow-up of her known COPD. Unfortunately she is continuing to smoke, but feels that her breathing has been doing fairly well. She is currently out of her medications, and cannot afford to have them filled until the first of the year. She denies any significant chest congestion, cough, or purulence.   Review of Systems  Constitutional: Negative for fever and unexpected weight change.  HENT: Positive for congestion, postnasal drip, sneezing and trouble swallowing. Negative for dental problem, ear pain, nosebleeds, rhinorrhea, sinus pressure and sore throat.   Eyes: Negative for redness and itching.  Respiratory: Positive for cough, chest tightness, shortness of breath and wheezing.   Cardiovascular: Negative for palpitations and leg swelling.  Gastrointestinal: Negative for nausea and vomiting.  Genitourinary: Negative for dysuria.  Musculoskeletal: Negative for joint swelling.  Skin: Negative for rash.  Neurological: Negative for headaches.  Hematological: Does not bruise/bleed easily.  Psychiatric/Behavioral: Negative for dysphoric mood. The patient is not nervous/anxious.        Objective:   Physical Exam Well-developed female in no acute distress Nose without purulence or discharge noted Neck without lymphadenopathy or thyromegaly Chest with decreased breath sounds, no active wheezing. Cardiac exam with regular rate and rhythm Lower extremities without edema, no cyanosis Alert and oriented, moves all 4 extremities.       Assessment & Plan:

## 2014-03-28 NOTE — Assessment & Plan Note (Signed)
The patient feels that she is at her usual baseline from a pulmonary standpoint, but unfortunately continues to smoke and currently cannot afford her medications. She is in the "doughnut hole" and told January 1. She feels her current medical regimen has helped her more than anything else, and we will try to continue this. I have also asked her to see if her insurance has a book with alternative medications that may be less expensive. We will give her samples today to get her through the end of the year.  Finally, I have stressed to her the importance of total smoking cessation, and she will work on this.

## 2014-03-28 NOTE — Patient Instructions (Signed)
Stay on spiriva and symbicort everyday.  See if your insurance has a book with a list of meds that may be more affordable. Work on stopping smoking. followup with me again in 54mos.

## 2014-04-10 DIAGNOSIS — E119 Type 2 diabetes mellitus without complications: Secondary | ICD-10-CM | POA: Diagnosis not present

## 2014-04-17 DIAGNOSIS — Z720 Tobacco use: Secondary | ICD-10-CM | POA: Diagnosis not present

## 2014-04-17 DIAGNOSIS — N9089 Other specified noninflammatory disorders of vulva and perineum: Secondary | ICD-10-CM | POA: Diagnosis not present

## 2014-04-17 DIAGNOSIS — E119 Type 2 diabetes mellitus without complications: Secondary | ICD-10-CM | POA: Diagnosis not present

## 2014-04-17 DIAGNOSIS — Z23 Encounter for immunization: Secondary | ICD-10-CM | POA: Diagnosis not present

## 2014-04-19 DIAGNOSIS — N949 Unspecified condition associated with female genital organs and menstrual cycle: Secondary | ICD-10-CM | POA: Diagnosis not present

## 2014-04-19 DIAGNOSIS — L821 Other seborrheic keratosis: Secondary | ICD-10-CM | POA: Diagnosis not present

## 2014-04-19 DIAGNOSIS — N9089 Other specified noninflammatory disorders of vulva and perineum: Secondary | ICD-10-CM | POA: Diagnosis not present

## 2014-04-26 DIAGNOSIS — N949 Unspecified condition associated with female genital organs and menstrual cycle: Secondary | ICD-10-CM | POA: Diagnosis not present

## 2014-04-26 DIAGNOSIS — N9089 Other specified noninflammatory disorders of vulva and perineum: Secondary | ICD-10-CM | POA: Diagnosis not present

## 2014-04-26 DIAGNOSIS — N3946 Mixed incontinence: Secondary | ICD-10-CM | POA: Diagnosis not present

## 2014-04-27 DIAGNOSIS — J449 Chronic obstructive pulmonary disease, unspecified: Secondary | ICD-10-CM | POA: Diagnosis not present

## 2014-05-23 DIAGNOSIS — I10 Essential (primary) hypertension: Secondary | ICD-10-CM | POA: Diagnosis not present

## 2014-05-23 DIAGNOSIS — I2510 Atherosclerotic heart disease of native coronary artery without angina pectoris: Secondary | ICD-10-CM | POA: Diagnosis not present

## 2014-05-23 DIAGNOSIS — J441 Chronic obstructive pulmonary disease with (acute) exacerbation: Secondary | ICD-10-CM | POA: Diagnosis not present

## 2014-05-23 DIAGNOSIS — Z23 Encounter for immunization: Secondary | ICD-10-CM | POA: Diagnosis not present

## 2014-05-23 DIAGNOSIS — E785 Hyperlipidemia, unspecified: Secondary | ICD-10-CM | POA: Diagnosis not present

## 2014-05-28 DIAGNOSIS — J449 Chronic obstructive pulmonary disease, unspecified: Secondary | ICD-10-CM | POA: Diagnosis not present

## 2014-06-14 ENCOUNTER — Ambulatory Visit: Payer: Self-pay | Admitting: Obstetrics and Gynecology

## 2014-06-14 DIAGNOSIS — Z01818 Encounter for other preprocedural examination: Secondary | ICD-10-CM | POA: Diagnosis not present

## 2014-06-14 DIAGNOSIS — J449 Chronic obstructive pulmonary disease, unspecified: Secondary | ICD-10-CM | POA: Diagnosis not present

## 2014-06-14 DIAGNOSIS — Z01810 Encounter for preprocedural cardiovascular examination: Secondary | ICD-10-CM | POA: Diagnosis not present

## 2014-06-14 DIAGNOSIS — I10 Essential (primary) hypertension: Secondary | ICD-10-CM | POA: Diagnosis not present

## 2014-06-14 DIAGNOSIS — N949 Unspecified condition associated with female genital organs and menstrual cycle: Secondary | ICD-10-CM | POA: Diagnosis not present

## 2014-06-14 DIAGNOSIS — J929 Pleural plaque without asbestos: Secondary | ICD-10-CM | POA: Diagnosis not present

## 2014-06-14 DIAGNOSIS — Z01812 Encounter for preprocedural laboratory examination: Secondary | ICD-10-CM | POA: Diagnosis not present

## 2014-06-18 ENCOUNTER — Ambulatory Visit: Payer: Self-pay | Admitting: Obstetrics and Gynecology

## 2014-06-18 DIAGNOSIS — L821 Other seborrheic keratosis: Secondary | ICD-10-CM | POA: Diagnosis not present

## 2014-06-18 DIAGNOSIS — G43909 Migraine, unspecified, not intractable, without status migrainosus: Secondary | ICD-10-CM | POA: Diagnosis not present

## 2014-06-18 DIAGNOSIS — R609 Edema, unspecified: Secondary | ICD-10-CM | POA: Diagnosis not present

## 2014-06-18 DIAGNOSIS — K219 Gastro-esophageal reflux disease without esophagitis: Secondary | ICD-10-CM | POA: Diagnosis not present

## 2014-06-18 DIAGNOSIS — D649 Anemia, unspecified: Secondary | ICD-10-CM | POA: Diagnosis not present

## 2014-06-18 DIAGNOSIS — I10 Essential (primary) hypertension: Secondary | ICD-10-CM | POA: Diagnosis not present

## 2014-06-18 DIAGNOSIS — Z90710 Acquired absence of both cervix and uterus: Secondary | ICD-10-CM | POA: Diagnosis not present

## 2014-06-18 DIAGNOSIS — G629 Polyneuropathy, unspecified: Secondary | ICD-10-CM | POA: Diagnosis not present

## 2014-06-18 DIAGNOSIS — Z7951 Long term (current) use of inhaled steroids: Secondary | ICD-10-CM | POA: Diagnosis not present

## 2014-06-18 DIAGNOSIS — Z9981 Dependence on supplemental oxygen: Secondary | ICD-10-CM | POA: Diagnosis not present

## 2014-06-18 DIAGNOSIS — Z79899 Other long term (current) drug therapy: Secondary | ICD-10-CM | POA: Diagnosis not present

## 2014-06-18 DIAGNOSIS — E119 Type 2 diabetes mellitus without complications: Secondary | ICD-10-CM | POA: Diagnosis not present

## 2014-06-18 DIAGNOSIS — N949 Unspecified condition associated with female genital organs and menstrual cycle: Secondary | ICD-10-CM | POA: Diagnosis not present

## 2014-06-18 DIAGNOSIS — N9089 Other specified noninflammatory disorders of vulva and perineum: Secondary | ICD-10-CM | POA: Diagnosis not present

## 2014-06-18 DIAGNOSIS — F17200 Nicotine dependence, unspecified, uncomplicated: Secondary | ICD-10-CM | POA: Diagnosis not present

## 2014-06-18 DIAGNOSIS — Z91018 Allergy to other foods: Secondary | ICD-10-CM | POA: Diagnosis not present

## 2014-06-18 DIAGNOSIS — H3530 Unspecified macular degeneration: Secondary | ICD-10-CM | POA: Diagnosis not present

## 2014-06-18 DIAGNOSIS — M1990 Unspecified osteoarthritis, unspecified site: Secondary | ICD-10-CM | POA: Diagnosis not present

## 2014-06-18 DIAGNOSIS — I2510 Atherosclerotic heart disease of native coronary artery without angina pectoris: Secondary | ICD-10-CM | POA: Diagnosis not present

## 2014-06-18 DIAGNOSIS — Z7982 Long term (current) use of aspirin: Secondary | ICD-10-CM | POA: Diagnosis not present

## 2014-06-18 DIAGNOSIS — A630 Anogenital (venereal) warts: Secondary | ICD-10-CM | POA: Diagnosis not present

## 2014-06-18 DIAGNOSIS — J449 Chronic obstructive pulmonary disease, unspecified: Secondary | ICD-10-CM | POA: Diagnosis not present

## 2014-06-26 DIAGNOSIS — J449 Chronic obstructive pulmonary disease, unspecified: Secondary | ICD-10-CM | POA: Diagnosis not present

## 2014-07-21 ENCOUNTER — Other Ambulatory Visit: Payer: Self-pay | Admitting: Cardiovascular Disease

## 2014-07-23 ENCOUNTER — Other Ambulatory Visit: Payer: Self-pay | Admitting: *Deleted

## 2014-07-23 MED ORDER — NITROGLYCERIN 0.4 MG SL SUBL: 0.4000 mg | SUBLINGUAL_TABLET | SUBLINGUAL | Status: DC | PRN

## 2014-07-24 DIAGNOSIS — N949 Unspecified condition associated with female genital organs and menstrual cycle: Secondary | ICD-10-CM | POA: Diagnosis not present

## 2014-07-24 DIAGNOSIS — Z4801 Encounter for change or removal of surgical wound dressing: Secondary | ICD-10-CM | POA: Diagnosis not present

## 2014-07-24 DIAGNOSIS — N763 Subacute and chronic vulvitis: Secondary | ICD-10-CM | POA: Diagnosis not present

## 2014-07-24 NOTE — Op Note (Signed)
PATIENT NAME:  [name] [name], [name] [name] MR#:  [id number] DATE OF BIRTH:  11-18-1940  DATE OF PROCEDURE:  12/28/2011  PREOPERATIVE DIAGNOSIS:  Senile cataract left eye.  POSTOPERATIVE DIAGNOSIS:  Senile cataract left eye.  PROCEDURE:  Phacoemulsification with posterior chamber intraocular lens implantation of the left eye.  LENS:  ZCB00 24.5 diopter posterior chamber intraocular lens.  ULTRASOUND TIME:  15% of 1 minute, 6 seconds for CDE 10.0.  SURGEON:  Mali Timira Bieda, MD  ANESTHESIA:  Topical with tetracaine drops and 2% Xylocaine jelly.  COMPLICATIONS:  None.  DESCRIPTION OF PROCEDURE:  The patient was identified in the holding room and transported to the operating room and placed in the supine position under the operating microscope.  The left eye was identified as the operative eye and it was prepped and draped in the usual sterile ophthalmic fashion.  A 1 millimeter clear-corneal paracentesis was made at the 1:30 position.  The anterior chamber was filled with Viscoat viscoelastic.  A 2.4 millimeter keratome was used to make a near-clear corneal incision at the 10:30 position.  A curvilinear capsulorrhexis was made with a cystotome and capsulorrhexis forceps.  Balanced salt solution was used to hydrodissect and hydrodelineate the nucleus.  Phacoemulsification was then used in horizontal chopping fashion to remove the lens nucleus and epinucleus.  The remaining cortex was then removed using the irrigation and aspiration handpiece. Provisc was then placed into the capsular bag to distend it for lens placement.  A ZCB00 24.5-diopter lens was then injected into the capsular bag.  The remaining viscoelastic was aspirated.  Wounds were hydrated with balanced salt solution.  The anterior chamber was inflated to a physiologic pressure with balanced salt solution.  Miostat was placed into the anterior chamber to constrict the pupil. No wound leaks were noted.  Topical Vigamox drops and Maxitrol ointment  were applied to the eye. The patient was taken to the recovery room in stable condition without complications of anesthesia or surgery. ____________________________ [name], MD crb:slb D: 12/28/2011 11:09:00 ET T: 12/28/2011 11:53:30 ET JOB#: [id number]  cc: [name], MD, <Dictator> Leandrew Koyanagi MD ELECTRONICALLY SIGNED 12/28/2011 13:20

## 2014-07-25 DIAGNOSIS — Z23 Encounter for immunization: Secondary | ICD-10-CM | POA: Diagnosis not present

## 2014-07-25 DIAGNOSIS — Z0000 Encounter for general adult medical examination without abnormal findings: Secondary | ICD-10-CM | POA: Diagnosis not present

## 2014-07-27 DIAGNOSIS — J449 Chronic obstructive pulmonary disease, unspecified: Secondary | ICD-10-CM | POA: Diagnosis not present

## 2014-07-30 ENCOUNTER — Ambulatory Visit (INDEPENDENT_AMBULATORY_CARE_PROVIDER_SITE_OTHER): Payer: Medicare Other | Admitting: Pulmonary Disease

## 2014-07-30 ENCOUNTER — Encounter: Payer: Self-pay | Admitting: Pulmonary Disease

## 2014-07-30 VITALS — BP 122/70 | HR 72 | Temp 97.7°F | Ht 64.0 in | Wt 154.8 lb

## 2014-07-30 DIAGNOSIS — J438 Other emphysema: Secondary | ICD-10-CM | POA: Diagnosis not present

## 2014-07-30 LAB — SURGICAL PATHOLOGY

## 2014-07-30 NOTE — Assessment & Plan Note (Signed)
The patient is doing well overall from a COPD standpoint, with no recent acute exacerbation. He is continuing on her bronchodilator regimen, and has decreased her smoking down to 3 cigarettes a day. I have congratulated her on this, but asked her to keep working toward total smoking cessation. She is having a lot of allergy symptoms, and will try her on Dymista.

## 2014-07-30 NOTE — Patient Instructions (Signed)
No change in your breathing medications Work on total smoking cessation.  You are doing good Will try dymista nasal spray 2 each nostril each am to see if helps allergy symptoms.  followup again in 65mo.

## 2014-07-30 NOTE — Progress Notes (Signed)
   Subjective:    Patient ID: [name] [name], female    DOB: Dec 22, 1940, 74 y.o.   MRN: [id number]  HPI The patient comes in today for follow-up of her known COPD. She is staying on her bronchodilator regimen, and has not had a recent acute exacerbation. She has decreased her smoking down to 3 cigarettes a day, and is working toward total cessation. She is also wearing her oxygen at night without issues. Her only complaint today is that of significant allergy issues with eye itching, postnasal drip, and nasal congestion. This has not responded to Benadryl.   Review of Systems  Constitutional: Negative for fever and unexpected weight change.  HENT: Positive for congestion and postnasal drip. Negative for dental problem, ear pain, nosebleeds, rhinorrhea, sinus pressure, sneezing, sore throat and trouble swallowing.   Eyes: Negative for redness and itching.  Respiratory: Positive for cough, shortness of breath and wheezing. Negative for chest tightness.   Cardiovascular: Positive for leg swelling. Negative for palpitations.  Gastrointestinal: Negative for nausea and vomiting.  Genitourinary: Negative for dysuria.  Musculoskeletal: Negative for joint swelling.  Skin: Negative for rash.  Neurological: Negative for headaches.  Hematological: Does not bruise/bleed easily.  Psychiatric/Behavioral: Negative for dysphoric mood. The patient is not nervous/anxious.        Objective:   Physical Exam Overweight female in no acute distress Nose without purulence or discharge noted Neck without lymphadenopathy or thyromegaly Chest with decreased breath sounds, no active wheezing Cardiac exam with regular rate and rhythm Lower extremities with mild edema, no cyanosis Alert and oriented, moves all 4 extremities.       Assessment & Plan:

## 2014-07-31 ENCOUNTER — Other Ambulatory Visit: Payer: Self-pay | Admitting: Family Medicine

## 2014-07-31 DIAGNOSIS — Z1231 Encounter for screening mammogram for malignant neoplasm of breast: Secondary | ICD-10-CM

## 2014-08-05 NOTE — Op Note (Signed)
PATIENT NAME:  [name] [name], GARBERS MR#:  [id number] DATE OF BIRTH:  1940-12-25  DATE OF PROCEDURE:  06/18/2014  PREOPERATIVE DIAGNOSIS: Right vulvar mass, 6 x 3 cm (preoperative biopsy consistent with seborrheic keratoses).   POSTOPERATIVE DIAGNOSIS: Right vulvar mass, 6 x 3 cm (preoperative biopsy consistent with seborrheic keratoses).   OPERATIVE PROCEDURE: Wide local excision of vulvar mass.   SURGEON: Alanda Slim. DeFrancesco, M.D.   FIRST ASSISTANT: None.   ANESTHESIA: IV sedation and local.   INDICATIONS: The patient is a 74 year old, white female, with a 6 cm vulvar mass, who presents for surgical excision. Preoperative biopsy of the mass was consistent with seborrheic keratoses.   FINDINGS AT SURGERY: Revealed a 6 x 3 cm verrucous mass involving the right labia majora.   DESCRIPTION OF PROCEDURE: The patient was brought to the operating room where she was placed in the supine position. IV sedation and local anesthetic were used to facilitate the procedure, and 15 mL of lidocaine with 1:100,000 epinephrine were used. After a Betadine preparation, and with the patient in candy cane stirrups, the margins of the incision were infiltrated with the 15 mL of lidocaine with 1:100,000 epinephrine local. Incision was made in an oval manner around the mass with a scalpel. This was followed by Bovie cautery using a needlepoint tip to excise the mass and control hemostasis. Minimal bleeding was encountered. Once the mass was excised, the incision site was closed in 2 layers using 3-0 Vicryl suture. Simple interrupted sutures were used to close the submucosa followed by subcuticular stitches of the skin. A Telfa and OpSite dressing was placed following the procedure. The patient was then mobilized and taken to the recovery room in satisfactory condition.   ESTIMATED BLOOD LOSS: 15 mL.   INTRAVENOUS FLUIDS: Were not available for quantifying at the time of this dictation.   URINE OUTPUT: 200 mL.    COUNTS: All instruments, needle, and sponge counts were verified as correct.    ____________________________ Alanda Slim. DeFrancesco, MD mad:JT D: 06/18/2014 13:06:00 ET T: 06/18/2014 14:38:50 ET JOB#: [id number]  cc: Hassell Done A. DeFrancesco, MD, <Dictator> [facility] MD ELECTRONICALLY SIGNED 06/20/2014 13:22

## 2014-08-07 DIAGNOSIS — N763 Subacute and chronic vulvitis: Secondary | ICD-10-CM | POA: Diagnosis not present

## 2014-08-07 DIAGNOSIS — N904 Leukoplakia of vulva: Secondary | ICD-10-CM | POA: Diagnosis not present

## 2014-08-09 ENCOUNTER — Other Ambulatory Visit: Payer: Self-pay | Admitting: Family Medicine

## 2014-08-09 ENCOUNTER — Ambulatory Visit
Admission: RE | Admit: 2014-08-09 | Discharge: 2014-08-09 | Disposition: A | Discharge status: Home / Self Care | Payer: Medicare Other | Source: Ambulatory Visit | Attending: Family Medicine | Admitting: Family Medicine

## 2014-08-09 DIAGNOSIS — Z1231 Encounter for screening mammogram for malignant neoplasm of breast: Secondary | ICD-10-CM

## 2014-08-10 DIAGNOSIS — M5412 Radiculopathy, cervical region: Secondary | ICD-10-CM | POA: Insufficient documentation

## 2014-08-10 DIAGNOSIS — G64 Other disorders of peripheral nervous system: Secondary | ICD-10-CM | POA: Insufficient documentation

## 2014-08-10 DIAGNOSIS — M549 Dorsalgia, unspecified: Secondary | ICD-10-CM | POA: Insufficient documentation

## 2014-08-10 DIAGNOSIS — G589 Mononeuropathy, unspecified: Secondary | ICD-10-CM | POA: Insufficient documentation

## 2014-08-10 DIAGNOSIS — R609 Edema, unspecified: Secondary | ICD-10-CM | POA: Insufficient documentation

## 2014-08-10 DIAGNOSIS — E1142 Type 2 diabetes mellitus with diabetic polyneuropathy: Secondary | ICD-10-CM | POA: Insufficient documentation

## 2014-08-10 DIAGNOSIS — J42 Unspecified chronic bronchitis: Secondary | ICD-10-CM | POA: Insufficient documentation

## 2014-08-10 DIAGNOSIS — IMO0001 Reserved for inherently not codable concepts without codable children: Secondary | ICD-10-CM | POA: Insufficient documentation

## 2014-08-10 DIAGNOSIS — E538 Deficiency of other specified B group vitamins: Secondary | ICD-10-CM | POA: Insufficient documentation

## 2014-08-10 DIAGNOSIS — H60549 Acute eczematoid otitis externa, unspecified ear: Secondary | ICD-10-CM | POA: Insufficient documentation

## 2014-08-10 DIAGNOSIS — M109 Gout, unspecified: Secondary | ICD-10-CM | POA: Insufficient documentation

## 2014-08-10 DIAGNOSIS — E039 Hypothyroidism, unspecified: Secondary | ICD-10-CM | POA: Insufficient documentation

## 2014-08-10 DIAGNOSIS — M791 Myalgia, unspecified site: Secondary | ICD-10-CM | POA: Insufficient documentation

## 2014-08-10 DIAGNOSIS — N9089 Other specified noninflammatory disorders of vulva and perineum: Secondary | ICD-10-CM | POA: Insufficient documentation

## 2014-08-10 DIAGNOSIS — D649 Anemia, unspecified: Secondary | ICD-10-CM | POA: Insufficient documentation

## 2014-08-10 DIAGNOSIS — I2510 Atherosclerotic heart disease of native coronary artery without angina pectoris: Secondary | ICD-10-CM | POA: Insufficient documentation

## 2014-08-14 DIAGNOSIS — S129XXS Fracture of neck, unspecified, sequela: Secondary | ICD-10-CM | POA: Diagnosis not present

## 2014-08-14 DIAGNOSIS — I10 Essential (primary) hypertension: Secondary | ICD-10-CM | POA: Diagnosis not present

## 2014-08-14 DIAGNOSIS — Z6826 Body mass index (BMI) 26.0-26.9, adult: Secondary | ICD-10-CM | POA: Diagnosis not present

## 2014-08-14 DIAGNOSIS — M542 Cervicalgia: Secondary | ICD-10-CM | POA: Diagnosis not present

## 2014-08-15 DIAGNOSIS — N763 Subacute and chronic vulvitis: Secondary | ICD-10-CM | POA: Diagnosis not present

## 2014-08-26 DIAGNOSIS — J449 Chronic obstructive pulmonary disease, unspecified: Secondary | ICD-10-CM | POA: Diagnosis not present

## 2014-08-27 ENCOUNTER — Ambulatory Visit (INDEPENDENT_AMBULATORY_CARE_PROVIDER_SITE_OTHER): Payer: Medicare Other | Admitting: Cardiovascular Disease

## 2014-08-27 ENCOUNTER — Encounter: Payer: Self-pay | Admitting: Cardiovascular Disease

## 2014-08-27 VITALS — BP 120/64 | HR 68 | Ht 64.0 in | Wt 155.2 lb

## 2014-08-27 DIAGNOSIS — I10 Essential (primary) hypertension: Secondary | ICD-10-CM

## 2014-08-27 DIAGNOSIS — M791 Myalgia, unspecified site: Secondary | ICD-10-CM

## 2014-08-27 DIAGNOSIS — Z720 Tobacco use: Secondary | ICD-10-CM

## 2014-08-27 DIAGNOSIS — I2510 Atherosclerotic heart disease of native coronary artery without angina pectoris: Secondary | ICD-10-CM

## 2014-08-27 DIAGNOSIS — R079 Chest pain, unspecified: Secondary | ICD-10-CM

## 2014-08-27 DIAGNOSIS — F17200 Nicotine dependence, unspecified, uncomplicated: Secondary | ICD-10-CM

## 2014-08-27 DIAGNOSIS — E1342 Other specified diabetes mellitus with diabetic polyneuropathy: Secondary | ICD-10-CM

## 2014-08-27 DIAGNOSIS — J438 Other emphysema: Secondary | ICD-10-CM

## 2014-08-27 NOTE — Assessment & Plan Note (Signed)
Currently with no symptoms of angina. No further workup at this time. Continue current medication regimen. 

## 2014-08-27 NOTE — Assessment & Plan Note (Signed)
Stable chronic shortness of breath. She uses nebulizers at home. Smoking cessation discussed with her Previously seen by pulmonary

## 2014-08-27 NOTE — Progress Notes (Signed)
Patient ID: [name] [name], female    DOB: 05/01/1940, 74 y.o.   MRN: [id number]  HPI Comments: [name] [name] is a 74 year old woman with a history of  COPD with ongoing tobacco use, stroke, hypertension, diabetes, CVA and hyperlipidemia. H/o chest pain with minimal nonobstructive coronary artery disease by catheterization on May 08, 2004, repeat catheterization September 2012 showing 50% LAD disease, 60% RCA disease, cardiac catheterization May 2015 showing no significant CAD who presents for follow-up of her shortness of breath, chest pain history Difficult to control diabetes  In follow-up today, she reports that she has significant cramping in her legs, bilateral sides, flanks This has been going on for quite some time. Symptoms have been severe.  Otherwise she denies any other chest pain symptoms Chronic shortness of breath. Continues to smoke Trying to use electronic cigarette  EKG on today's visit shows normal sinus rhythm with rate 68 bpm, no significant ST or T-wave changes  Other past medical history Prior catheterization in 2015 showed no significant CAD. There is no evidence of previously seen moderate disease in the LAD or RCA. left arm symptoms were from musculoskeletal etiology, unable to exclude arthritis in her neck and DJD.  She has a long history of chronic shortness of breath likely secondary to COPD She continues to have problems with diabetes control. No lightheadedness, PND or orthopnea. ACE inhibitor held by pulmonary for possible cough.   previous Total cholesterol 210, LDL 130, HDL 43  Allergies  Allergen Reactions  . Coconut Fatty Acids Swelling    Throat swells    Current Outpatient Prescriptions on File Prior to Visit  Medication Sig Dispense Refill  . Aclidinium Bromide 400 MCG/ACT AEPB Inhale 1 puff into the lungs 2 (two) times daily.    Marland Kitchen albuterol (PROVENTIL HFA;VENTOLIN HFA) 108 (90 BASE) MCG/ACT inhaler Inhale 1-2 puffs into the lungs every 4  (four) hours as needed.    Marland Kitchen aspirin 81 MG tablet Take 81 mg by mouth daily.    Marland Kitchen atorvastatin (LIPITOR) 20 MG tablet Take 1 tablet by mouth at bedtime.    . beta carotene w/minerals (OCUVITE) tablet Take 1 tablet by mouth daily.    . budesonide-formoterol (SYMBICORT) 160-4.5 MCG/ACT inhaler Inhale 2 puffs into the lungs 2 (two) times daily. 1 Inhaler 0  . canagliflozin (INVOKANA) 300 MG TABS tablet Take 1 tablet by mouth daily.    . cholecalciferol (VITAMIN D) 1000 UNITS tablet Take 1,000 Units by mouth daily.    . diazepam (VALIUM) 5 MG tablet Take 1 tablet (5 mg total) by mouth every 6 (six) hours as needed for muscle spasms. 50 tablet 1  . diphenhydrAMINE (BENADRYL) 25 MG tablet Take 25 mg by mouth every 6 (six) hours as needed.    . docusate sodium 100 MG CAPS Take 100 mg by mouth 2 (two) times daily. 60 capsule 0  . gabapentin (NEURONTIN) 600 MG tablet Take 600 mg by mouth 2 (two) times daily.     Marland Kitchen glimepiride (AMARYL) 4 MG tablet Take 4 mg by mouth 2 (two) times daily.     Marland Kitchen glucose blood test strip as directed.    Marland Kitchen HYDROcodone-acetaminophen (NORCO/VICODIN) 5-325 MG per tablet Take 1 tablet by mouth every 6 (six) hours as needed.    . isosorbide mononitrate (IMDUR) 30 MG 24 hr tablet Take 1 tablet by mouth  daily 90 tablet 3  . LANCETS ULTRA THIN MISC as directed.    Marland Kitchen losartan-hydrochlorothiazide (HYZAAR) 100-25 MG per tablet Take  1 tablet by mouth daily.    . magnesium oxide (MAG-OX) 400 (241.3 MG) MG tablet Take 1 tablet by mouth 2 (two) times daily.    . metFORMIN (GLUCOPHAGE) 1000 MG tablet Take 1,000 mg by mouth daily.     . Multiple Vitamins-Minerals (CENTRUM SILVER ADULT 50+ PO) Take 1 tablet by mouth daily.    . nitroGLYCERIN (NITROSTAT) 0.4 MG SL tablet Place 1 tablet (0.4 mg total) under the tongue every 5 (five) minutes as needed for chest pain. 25 tablet 3  . omeprazole (PRILOSEC) 20 MG capsule Take 20 mg by mouth daily.      Marland Kitchen oxyCODONE-acetaminophen (PERCOCET/ROXICET)  5-325 MG per tablet Take 1-2 tablets by mouth every 4 (four) hours as needed for moderate pain. 100 tablet 0  . Potassium Chloride (KLOR-CON 10 PO) Take 1-3 tablets by mouth daily as needed.    . saxagliptin HCl (ONGLYZA) 5 MG TABS tablet Take 5 mg by mouth daily.      . Tiotropium Bromide Monohydrate (SPIRIVA RESPIMAT) 2.5 MCG/ACT AERS Inhale 2 puffs into the lungs daily. 12 g 3   No current facility-administered medications on file prior to visit.    Past Medical History  Diagnosis Date  . CAD (coronary artery disease)     Minimal nonobstructive, cath 05/2004  . Hypertension   . Diabetes mellitus   . Hyperlipemia     Followed by Dr. Rosanna Randy  . Stroke     6 yrs ago  . COPD (chronic obstructive pulmonary disease)   . Shortness of breath   . Pneumonia     hx  . Macular degeneration     rt    Past Surgical History  Procedure Laterality Date  . Cardiac catheterization  05/2004  . Cholecystectomy    . Vesicovaginal fistula closure w/ tah    . Cataract extraction Left   . Back surgery  80's  . Anterior cervical decomp/discectomy fusion N/A 10/19/2013    Procedure: CERVICAL FIVE-SIX ANTERIOR CERVICAL DECOMPRESSION WITH FUSION INTERBODY PROSTHESIS PLATING AND PEEK CAGE;  Surgeon: Ophelia Charter, MD;  Location: [facility];  Service: Neurosurgery;  Laterality: N/A;  . Breast cyst excision Left     left negative   . Tonsillectomy and adenoidectomy    . Abdominal hysterectomy      Social History  reports that she has been smoking Cigarettes.  She has a 27 pack-year smoking history. She has never used smokeless tobacco. She reports that she does not drink alcohol or use illicit drugs.  Family History family history includes Heart attack in her brother, brother, father, and paternal grandmother; Lung cancer in her maternal grandfather; Stroke in her mother.  Review of Systems  Constitutional: Negative.   HENT: Negative.   Eyes: Negative.   Cardiovascular:       Left arm  pain  Gastrointestinal: Negative.   Endocrine: Negative.   Musculoskeletal: Positive for myalgias.  Skin: Negative.   Allergic/Immunologic: Negative.   Neurological: Negative.   Hematological: Negative.   Psychiatric/Behavioral: Negative.   All other systems reviewed and are negative.    BP 120/64 mmHg  Pulse 68  Ht '5\' 4"'$  (1.626 m)  Wt 155 lb 4 oz (70.421 kg)  BMI 26.64 kg/m2  Physical Exam  Constitutional: She is oriented to person, place, and time. She appears well-developed and well-nourished.  HENT:  Head: Normocephalic.  Nose: Nose normal.  Mouth/Throat: Oropharynx is clear and moist.  Eyes: Conjunctivae are normal. Pupils are equal, round, and reactive  to light.  Neck: Normal range of motion. Neck supple. No JVD present.  Cardiovascular: Normal rate, regular rhythm, S1 normal, S2 normal, normal heart sounds and intact distal pulses.  Exam reveals no gallop and no friction rub.   No murmur heard. Pulmonary/Chest: Effort normal. No respiratory distress. She has decreased breath sounds. She has no wheezes. She has no rales. She exhibits no tenderness.  Abdominal: Soft. Bowel sounds are normal. She exhibits no distension. There is no tenderness.  Musculoskeletal: Normal range of motion. She exhibits no edema or tenderness.  Lymphadenopathy:    She has no cervical adenopathy.  Neurological: She is alert and oriented to person, place, and time. Coordination normal.  Skin: Skin is warm and dry. No rash noted. No erythema.  Psychiatric: She has a normal mood and affect. Her behavior is normal. Judgment and thought content normal.    Assessment and Plan  Nursing note and vitals reviewed.

## 2014-08-27 NOTE — Assessment & Plan Note (Signed)
We have encouraged her to continue to work on weaning her cigarettes and smoking cessation. She will continue to work on this and does not want any assistance with chantix.  

## 2014-08-27 NOTE — Patient Instructions (Addendum)
For cramping Please hold the atorvastatin/lipitor for a month  If cramping gets better, call the office  We would try an alternate pill  Please call us if you have new issues that need to be addressed before your next appt.  Your physician wants you to follow-up in: 12 months.  You will receive a reminder letter in the mail two months in advance. If you don't receive a letter, please call our office to schedule the follow-up appointment.

## 2014-08-27 NOTE — Assessment & Plan Note (Signed)
Blood pressure is well controlled on today's visit. No changes made to the medications. 

## 2014-08-27 NOTE — Assessment & Plan Note (Signed)
We have encouraged continued exercise, careful diet management in an effort to lose weight. 

## 2014-08-27 NOTE — Assessment & Plan Note (Signed)
We have recommended that she hold her Lipitor for several weeks' time to see if her muscle ache will improve. Suggested she call our office if symptoms improve. Alternate statin could be tried

## 2014-08-29 ENCOUNTER — Encounter: Payer: Self-pay | Admitting: Emergency Medicine

## 2014-08-29 ENCOUNTER — Emergency Department: Payer: Medicare Other

## 2014-08-29 ENCOUNTER — Inpatient Hospital Stay
Admission: EM | Admit: 2014-08-29 | Discharge: 2014-08-30 | DRG: 068 | Disposition: A | Discharge status: Home / Self Care | Payer: Medicare Other | Attending: Internal Medicine | Admitting: Internal Medicine

## 2014-08-29 DIAGNOSIS — I2510 Atherosclerotic heart disease of native coronary artery without angina pectoris: Secondary | ICD-10-CM | POA: Diagnosis not present

## 2014-08-29 DIAGNOSIS — Z8249 Family history of ischemic heart disease and other diseases of the circulatory system: Secondary | ICD-10-CM | POA: Diagnosis not present

## 2014-08-29 DIAGNOSIS — R29898 Other symptoms and signs involving the musculoskeletal system: Secondary | ICD-10-CM

## 2014-08-29 DIAGNOSIS — J449 Chronic obstructive pulmonary disease, unspecified: Secondary | ICD-10-CM | POA: Diagnosis not present

## 2014-08-29 DIAGNOSIS — I10 Essential (primary) hypertension: Secondary | ICD-10-CM | POA: Diagnosis not present

## 2014-08-29 DIAGNOSIS — Z8673 Personal history of transient ischemic attack (TIA), and cerebral infarction without residual deficits: Secondary | ICD-10-CM

## 2014-08-29 DIAGNOSIS — Z7982 Long term (current) use of aspirin: Secondary | ICD-10-CM | POA: Diagnosis not present

## 2014-08-29 DIAGNOSIS — M6281 Muscle weakness (generalized): Secondary | ICD-10-CM | POA: Diagnosis not present

## 2014-08-29 DIAGNOSIS — I6529 Occlusion and stenosis of unspecified carotid artery: Principal | ICD-10-CM | POA: Diagnosis present

## 2014-08-29 DIAGNOSIS — F17210 Nicotine dependence, cigarettes, uncomplicated: Secondary | ICD-10-CM | POA: Diagnosis present

## 2014-08-29 DIAGNOSIS — I672 Cerebral atherosclerosis: Secondary | ICD-10-CM | POA: Diagnosis not present

## 2014-08-29 DIAGNOSIS — G8194 Hemiplegia, unspecified affecting left nondominant side: Secondary | ICD-10-CM | POA: Diagnosis present

## 2014-08-29 DIAGNOSIS — M62838 Other muscle spasm: Secondary | ICD-10-CM | POA: Diagnosis not present

## 2014-08-29 DIAGNOSIS — R079 Chest pain, unspecified: Secondary | ICD-10-CM | POA: Diagnosis present

## 2014-08-29 DIAGNOSIS — I639 Cerebral infarction, unspecified: Secondary | ICD-10-CM

## 2014-08-29 DIAGNOSIS — E785 Hyperlipidemia, unspecified: Secondary | ICD-10-CM | POA: Diagnosis present

## 2014-08-29 DIAGNOSIS — I6523 Occlusion and stenosis of bilateral carotid arteries: Secondary | ICD-10-CM | POA: Diagnosis not present

## 2014-08-29 DIAGNOSIS — J439 Emphysema, unspecified: Secondary | ICD-10-CM | POA: Diagnosis present

## 2014-08-29 DIAGNOSIS — I25119 Atherosclerotic heart disease of native coronary artery with unspecified angina pectoris: Secondary | ICD-10-CM | POA: Diagnosis not present

## 2014-08-29 DIAGNOSIS — E1142 Type 2 diabetes mellitus with diabetic polyneuropathy: Secondary | ICD-10-CM | POA: Diagnosis present

## 2014-08-29 DIAGNOSIS — R42 Dizziness and giddiness: Secondary | ICD-10-CM | POA: Diagnosis not present

## 2014-08-29 LAB — BASIC METABOLIC PANEL
Anion gap: 12 (ref 5–15)
BUN: 21 mg/dL — AB (ref 6–20)
CO2: 31 mmol/L (ref 22–32)
Calcium: 9.1 mg/dL (ref 8.9–10.3)
Chloride: 95 mmol/L — ABNORMAL LOW (ref 101–111)
Creatinine, Ser: 1.11 mg/dL — ABNORMAL HIGH (ref 0.44–1.00)
GFR calc non Af Amer: 48 mL/min — ABNORMAL LOW (ref >60)
GFR, EST AFRICAN AMERICAN: 56 mL/min — AB (ref >60)
GLUCOSE: 172 mg/dL — AB (ref 65–99)
Potassium: 4.2 mmol/L (ref 3.5–5.1)
Sodium: 138 mmol/L (ref 135–145)

## 2014-08-29 LAB — CBC
HCT: 42.8 % (ref 35.0–47.0)
HEMOGLOBIN: 14.7 g/dL (ref 12.0–16.0)
MCH: 32 pg (ref 26.0–34.0)
MCHC: 34.3 g/dL (ref 32.0–36.0)
MCV: 93.3 fL (ref 80.0–100.0)
Platelets: 281 10*3/uL (ref 150–440)
RBC: 4.58 MIL/uL (ref 3.80–5.20)
RDW: 15 % — AB (ref 11.5–14.5)
WBC: 11.1 10*3/uL — ABNORMAL HIGH (ref 3.6–11.0)

## 2014-08-29 LAB — TROPONIN I
Troponin I: <0.03 ng/mL (ref <0.031)
Troponin I: <0.03 ng/mL (ref <0.031)

## 2014-08-29 MED ORDER — ASPIRIN 81 MG PO CHEW
CHEWABLE_TABLET | ORAL | Status: AC
  Administered 2014-08-29: 324 mg via ORAL
  Filled 2014-08-29: qty 4

## 2014-08-29 MED ORDER — ASPIRIN 81 MG PO CHEW
324.0000 mg | CHEWABLE_TABLET | Freq: Once | ORAL | Status: AC
  Administered 2014-08-29: 324 mg via ORAL

## 2014-08-29 NOTE — ED Notes (Signed)
Pt presents with chest pain started about one hour ago.

## 2014-08-29 NOTE — ED Provider Notes (Signed)
[facility] Emergency Department Provider Note  ____________________________________________  Time seen: Approximately 10:15 PM  I have reviewed the triage vital signs and the nursing notes.   HISTORY  Chief Complaint Chest Pain    HPI [name] [name] is a 74 y.o. female with a history of CAD and stroke presents with intermittent chest pain and left lower summary weakness which started today about 2 PM. Patient said she did several minutes of midsternal chest pain which radiated to her flanks. Says that she has this chest pain on and off and that is a chronic issue. There was no associated shortness of breath or nausea or vomiting. However, she says that the left lower extremity weakness started today also and is similar to the symptoms that she was having with her stroke. She has been unable to walk even with her cane or walker. She says she also has some lower extremity spasms but did not have them today with weakness. She denies any chest pain at this time.   Past Medical History  Diagnosis Date  . CAD (coronary artery disease)     Minimal nonobstructive, cath 05/2004  . Hypertension   . Diabetes mellitus   . Hyperlipemia     Followed by Dr. Rosanna Randy  . Stroke     6 yrs ago  . COPD (chronic obstructive pulmonary disease)   . Shortness of breath   . Pneumonia     hx  . Macular degeneration     rt    Patient Active Problem List   Diagnosis Date Noted  . Cervical nerve root disorder 08/10/2014  . Absolute anemia 08/10/2014  . CAD in native artery 08/10/2014  . B12 deficiency 08/10/2014  . Back ache 08/10/2014  . Bronchitis, chronic 08/10/2014  . Diabetes mellitus with polyneuropathy 08/10/2014  . Can't get food down 08/10/2014  . Eczema of external ear 08/10/2014  . Accumulation of fluid in tissues 08/10/2014  . Gout 08/10/2014  . Adult hypothyroidism 08/10/2014  . Mononeuritis 08/10/2014  . Muscle ache 08/10/2014  . Disorder of peripheral  nervous system 08/10/2014  . Lesion of vulva 08/10/2014  . Cervical spondylosis with radiculopathy 10/19/2013  . Unstable angina 08/15/2013  . COPD exacerbation 03/29/2013  . CAD (coronary artery disease) 06/22/2011  . CVA (cerebral infarction) 09/28/2010  . COPD (chronic obstructive pulmonary disease) with emphysema 03/07/2010  . CHEST PAIN UNSPECIFIED 07/22/2009  . HLD (hyperlipidemia) 04/01/2009  . Malaise and fatigue 04/01/2009  . HYPERLIPIDEMIA-MIXED 01/18/2009  . TOBACCO ABUSE 01/18/2009  . HYPERTENSION, BENIGN 01/18/2009  . CLAUDICATION 01/18/2009  . Pain in limb 01/18/2009  . CAFL (chronic airflow limitation) 01/20/2007  . Late effects of cerebrovascular disease 01/10/2007  . Essential (primary) hypertension 12/22/2006    Past Surgical History  Procedure Laterality Date  . Cardiac catheterization  05/2004  . Cholecystectomy    . Vesicovaginal fistula closure w/ tah    . Cataract extraction Left   . Back surgery  80's  . Anterior cervical decomp/discectomy fusion N/A 10/19/2013    Procedure: CERVICAL FIVE-SIX ANTERIOR CERVICAL DECOMPRESSION WITH FUSION INTERBODY PROSTHESIS PLATING AND PEEK CAGE;  Surgeon: Ophelia Charter, MD;  Location: [place] [facility];  Service: Neurosurgery;  Laterality: N/A;  . Breast cyst excision Left     left negative   . Tonsillectomy and adenoidectomy    . Abdominal hysterectomy      Current Outpatient Rx  Name  Route  Sig  Dispense  Refill  . Aclidinium Bromide 400 MCG/ACT  AEPB   Inhalation   Inhale 1 puff into the lungs 2 (two) times daily.         Marland Kitchen albuterol (PROVENTIL HFA;VENTOLIN HFA) 108 (90 BASE) MCG/ACT inhaler   Inhalation   Inhale 1-2 puffs into the lungs every 4 (four) hours as needed.         Marland Kitchen aspirin 81 MG tablet   Oral   Take 81 mg by mouth daily.         Marland Kitchen atorvastatin (LIPITOR) 20 MG tablet   Oral   Take 1 tablet by mouth at bedtime.         . beta carotene w/minerals (OCUVITE) tablet   Oral   Take 1  tablet by mouth daily.         . budesonide-formoterol (SYMBICORT) 160-4.5 MCG/ACT inhaler   Inhalation   Inhale 2 puffs into the lungs 2 (two) times daily.   1 Inhaler   0   . canagliflozin (INVOKANA) 300 MG TABS tablet   Oral   Take 1 tablet by mouth daily.         . cholecalciferol (VITAMIN D) 1000 UNITS tablet   Oral   Take 1,000 Units by mouth daily.         . diazepam (VALIUM) 5 MG tablet   Oral   Take 1 tablet (5 mg total) by mouth every 6 (six) hours as needed for muscle spasms.   50 tablet   1   . diphenhydrAMINE (BENADRYL) 25 MG tablet   Oral   Take 25 mg by mouth every 6 (six) hours as needed.         . docusate sodium 100 MG CAPS   Oral   Take 100 mg by mouth 2 (two) times daily.   60 capsule   0   . gabapentin (NEURONTIN) 600 MG tablet   Oral   Take 600 mg by mouth 2 (two) times daily.          Marland Kitchen glimepiride (AMARYL) 4 MG tablet   Oral   Take 4 mg by mouth 2 (two) times daily.          Marland Kitchen glucose blood test strip      as directed.         Marland Kitchen HYDROcodone-acetaminophen (NORCO/VICODIN) 5-325 MG per tablet   Oral   Take 1 tablet by mouth every 6 (six) hours as needed.         . isosorbide mononitrate (IMDUR) 30 MG 24 hr tablet      Take 1 tablet by mouth  daily   90 tablet   3   . LANCETS ULTRA THIN MISC      as directed.         Marland Kitchen losartan-hydrochlorothiazide (HYZAAR) 100-25 MG per tablet   Oral   Take 1 tablet by mouth daily.         . magnesium oxide (MAG-OX) 400 (241.3 MG) MG tablet   Oral   Take 1 tablet by mouth 2 (two) times daily.         . metFORMIN (GLUCOPHAGE) 1000 MG tablet   Oral   Take 1,000 mg by mouth daily.          . Multiple Vitamins-Minerals (CENTRUM SILVER ADULT 50+ PO)   Oral   Take 1 tablet by mouth daily.         . nitroGLYCERIN (NITROSTAT) 0.4 MG SL tablet   Sublingual   Place 1 tablet (0.4 mg total)  under the tongue every 5 (five) minutes as needed for chest pain.   25 tablet   3    . omeprazole (PRILOSEC) 20 MG capsule   Oral   Take 20 mg by mouth daily.           Marland Kitchen oxyCODONE-acetaminophen (PERCOCET/ROXICET) 5-325 MG per tablet   Oral   Take 1-2 tablets by mouth every 4 (four) hours as needed for moderate pain.   100 tablet   0   . Potassium Chloride (KLOR-CON 10 PO)   Oral   Take 1-3 tablets by mouth daily as needed.         . saxagliptin HCl (ONGLYZA) 5 MG TABS tablet   Oral   Take 5 mg by mouth daily.           . Tiotropium Bromide Monohydrate (SPIRIVA RESPIMAT) 2.5 MCG/ACT AERS   Inhalation   Inhale 2 puffs into the lungs daily.   12 g   3     Allergies Coconut fatty acids  Family History  Problem Relation Age of Onset  . Stroke Mother     Massive  . Heart attack Father     Massive  . Heart attack Brother   . Lung cancer Maternal Grandfather   . Heart attack Paternal Grandmother     MI  . Heart attack Brother     Social History History  Substance Use Topics  . Smoking status: Current Every Day Smoker -- 0.50 packs/day for 54 years    Types: Cigarettes  . Smokeless tobacco: Never Used     Comment: smokes 3 cigs daily 07/31/14  . Alcohol Use: No    Review of Systems Constitutional: No fever/chills Eyes: No visual changes. ENT: No sore throat. Cardiovascular: Denies chest pain. Respiratory: Denies shortness of breath. Gastrointestinal: No abdominal pain.  No nausea, no vomiting.  No diarrhea.  No constipation. Genitourinary: Negative for dysuria. Musculoskeletal: Negative for back pain. Skin: Negative for rash. Neurological: Negative for headaches, focal weakness or numbness.  10-point ROS otherwise negative.  ____________________________________________   PHYSICAL EXAM:  VITAL SIGNS: ED Triage Vitals  Enc Vitals Group     BP 08/29/14 1854 137/59 mmHg     Pulse Rate 08/29/14 1854 86     Resp 08/29/14 1854 20     Temp 08/29/14 2107 97.8 F (36.6 C)     Temp Source 08/29/14 2107 Oral     SpO2 08/29/14 1854 96  %     Weight 08/29/14 1854 155 lb (70.308 kg)     Height 08/29/14 1854 '5\' 4"'$  (1.626 m)     Head Cir --      Peak Flow --      Pain Score 08/29/14 1855 10     Pain Loc --      Pain Edu? --      Excl. in [place]? --     Constitutional: Alert and oriented. Well appearing and in no acute distress. Eyes: Conjunctivae are normal. PERRL. EOMI. Head: Atraumatic. Nose: No congestion/rhinnorhea. Mouth/Throat: Mucous membranes are moist.  Oropharynx non-erythematous. Neck: No stridor.   Cardiovascular: Normal rate, regular rhythm. Grossly normal heart sounds.  Good peripheral circulation. pulses present and equal to the bilateral radial and dorsalis pedis. Respiratory: Normal respiratory effort.  No retractions. Lungs CTAB. Gastrointestinal: Soft and nontender. No distention. No abdominal bruits. No CVA tenderness. Musculoskeletal: No lower extremity tenderness nor edema.  No joint effusions. Neurologic:  Normal speech and language. 4-5 strength the left lower  extremity.Marland Kitchen Speech is normal. No gait instability. Skin:  Skin is warm, dry and intact. No rash noted. Psychiatric: Mood and affect are normal. Speech and behavior are normal.  NIH Stroke Scale    Person Administering Scale: Doran Stabler  Administer stroke scale items in the order listed. Record performance in each category after each subscale exam. Do not go back and change scores. Follow directions provided for each exam technique. Scores should reflect what the patient does, not what the clinician thinks the patient can do. The clinician should record answers while administering the exam and work quickly. Except where indicated, the patient should not be coached (i.e., repeated requests to patient to make a special effort).   1a  Level of consciousness: 0=alert; keenly responsive  1b. LOC questions:  0=Performs both tasks correctly  1c. LOC commands: 0=Performs both tasks correctly  2.  Best Gaze: 0=normal  3.  Visual: 0=No  visual loss  4. Facial Palsy: 0=Normal symmetric movement  5a.  Motor left arm: 0=No drift, limb holds 90 (or 45) degrees for full 10 seconds  5b.  Motor right arm: 0=No drift, limb holds 90 (or 45) degrees for full 10 seconds  6a. motor left leg: 1=Drift, limb holds 90 (or 45) degrees but drifts down before full 10 seconds: does not hit bed  6b  Motor right leg:  0=No drift, limb holds 90 (or 45) degrees for full 10 seconds  7. Limb Ataxia: 0=Absent  8.  Sensory: 0=Normal; no sensory loss  9. Best Language:  0=No aphasia, normal  10. Dysarthria: 0=Normal  11. Extinction and Inattention: 0=No abnormality  12. Distal motor function: 0=Normal   Total:   1  _____________________   LABS (all labs ordered are listed, but only abnormal results are displayed)  Labs Reviewed  CBC - Abnormal; Notable for the following:    WBC 11.1 (*)    RDW 15.0 (*)    All other components within normal limits  BASIC METABOLIC PANEL - Abnormal; Notable for the following:    Chloride 95 (*)    Glucose, Bld 172 (*)    BUN 21 (*)    Creatinine, Ser 1.11 (*)    GFR calc non Af Amer 48 (*)    GFR calc Af Amer 56 (*)    All other components within normal limits  TROPONIN I  TROPONIN I   ____________________________________________  EKG  ED ECG REPORT I, Doran Stabler, the attending physician, personally viewed and interpreted this ECG.   Date: 08/29/2014  EKG Time: 1903  Rate: 81  Rhythm: Sinus rhythm with short PR  Axis: Normal axis  Intervals:none  ST&T Change: No ST elevations or depressions. No T-wave inversions.  ____________________________________________  RADIOLOGY  Negative noncontrast head CT. Hyperexpanded lungs. No acute party pulmonary disease. ____________________________________________   PROCEDURES    ____________________________________________   INITIAL IMPRESSION / ASSESSMENT AND PLAN / ED COURSE  Pertinent labs & imaging results that were available during  my care of the patient were reviewed by me and considered in my medical decision making (see chart for details).  ----------------------------------------- 11:36 PM on 08/29/2014 -----------------------------------------  Patient still with lower 70 weakness. We'll admit for stroke rule out. We'll give aspirin. Out of window for TPA. ____________________________________________   FINAL CLINICAL IMPRESSION(S) / ED DIAGNOSES  Left lower extremity weakness, likely CVA. Acute, initial visit.    Orbie Pyo, MD 08/29/14 [phone]

## 2014-08-30 ENCOUNTER — Inpatient Hospital Stay: Payer: Medicare Other

## 2014-08-30 DIAGNOSIS — Z7982 Long term (current) use of aspirin: Secondary | ICD-10-CM | POA: Diagnosis not present

## 2014-08-30 DIAGNOSIS — J449 Chronic obstructive pulmonary disease, unspecified: Secondary | ICD-10-CM | POA: Diagnosis present

## 2014-08-30 DIAGNOSIS — I639 Cerebral infarction, unspecified: Secondary | ICD-10-CM | POA: Diagnosis present

## 2014-08-30 DIAGNOSIS — R079 Chest pain, unspecified: Secondary | ICD-10-CM | POA: Diagnosis present

## 2014-08-30 DIAGNOSIS — Z8249 Family history of ischemic heart disease and other diseases of the circulatory system: Secondary | ICD-10-CM | POA: Diagnosis not present

## 2014-08-30 DIAGNOSIS — G8194 Hemiplegia, unspecified affecting left nondominant side: Secondary | ICD-10-CM | POA: Diagnosis present

## 2014-08-30 DIAGNOSIS — I6529 Occlusion and stenosis of unspecified carotid artery: Secondary | ICD-10-CM | POA: Diagnosis present

## 2014-08-30 DIAGNOSIS — E785 Hyperlipidemia, unspecified: Secondary | ICD-10-CM | POA: Diagnosis present

## 2014-08-30 DIAGNOSIS — Z8673 Personal history of transient ischemic attack (TIA), and cerebral infarction without residual deficits: Secondary | ICD-10-CM | POA: Diagnosis not present

## 2014-08-30 DIAGNOSIS — E1142 Type 2 diabetes mellitus with diabetic polyneuropathy: Secondary | ICD-10-CM | POA: Diagnosis present

## 2014-08-30 DIAGNOSIS — I2510 Atherosclerotic heart disease of native coronary artery without angina pectoris: Secondary | ICD-10-CM | POA: Diagnosis present

## 2014-08-30 DIAGNOSIS — I10 Essential (primary) hypertension: Secondary | ICD-10-CM | POA: Diagnosis present

## 2014-08-30 DIAGNOSIS — M62838 Other muscle spasm: Secondary | ICD-10-CM | POA: Diagnosis present

## 2014-08-30 DIAGNOSIS — F17210 Nicotine dependence, cigarettes, uncomplicated: Secondary | ICD-10-CM | POA: Diagnosis present

## 2014-08-30 LAB — GLUCOSE, CAPILLARY
GLUCOSE-CAPILLARY: 235 mg/dL — AB (ref 65–99)
Glucose-Capillary: 188 mg/dL — ABNORMAL HIGH (ref 65–99)
Glucose-Capillary: 131 mg/dL — ABNORMAL HIGH (ref 65–99)

## 2014-08-30 LAB — LIPID PANEL
CHOLESTEROL: 195 mg/dL (ref 0–200)
HDL: 47 mg/dL (ref >40)
LDL Cholesterol: 109 mg/dL — ABNORMAL HIGH (ref 0–99)
TRIGLYCERIDES: 197 mg/dL — AB (ref <150)
Total CHOL/HDL Ratio: 4.1 RATIO
VLDL: 39 mg/dL (ref 0–40)

## 2014-08-30 LAB — HEMOGLOBIN A1C: Hgb A1c MFr Bld: 7.9 % — ABNORMAL HIGH (ref 4.0–6.0)

## 2014-08-30 LAB — TROPONIN I: Troponin I: <0.03 ng/mL (ref <0.031)

## 2014-08-30 LAB — MRSA PCR SCREENING: MRSA by PCR: POSITIVE — AB

## 2014-08-30 MED ORDER — ATORVASTATIN CALCIUM 20 MG PO TABS: 40.0000 mg | ORAL_TABLET | Freq: Every day | ORAL | Status: DC

## 2014-08-30 MED ORDER — CHLORHEXIDINE GLUCONATE CLOTH 2 % EX PADS: 6.0000 | MEDICATED_PAD | Freq: Every day | CUTANEOUS | Status: DC

## 2014-08-30 MED ORDER — BUDESONIDE-FORMOTEROL FUMARATE 160-4.5 MCG/ACT IN AERO
2.0000 | INHALATION_SPRAY | Freq: Two times a day (BID) | RESPIRATORY_TRACT | Status: DC
  Administered 2014-08-30 (×2): 2 via RESPIRATORY_TRACT
  Filled 2014-08-30: qty 6

## 2014-08-30 MED ORDER — MUPIROCIN 2 % EX OINT: 1.0000 "application " | TOPICAL_OINTMENT | Freq: Two times a day (BID) | CUTANEOUS | Status: AC

## 2014-08-30 MED ORDER — NITROGLYCERIN 0.4 MG SL SUBL: 0.4000 mg | SUBLINGUAL_TABLET | SUBLINGUAL | Status: DC | PRN

## 2014-08-30 MED ORDER — ATORVASTATIN CALCIUM 20 MG PO TABS: 20.0000 mg | ORAL_TABLET | Freq: Every day | ORAL | Status: DC

## 2014-08-30 MED ORDER — TIOTROPIUM BROMIDE MONOHYDRATE 2.5 MCG/ACT IN AERS: 2.0000 | INHALATION_SPRAY | Freq: Every day | RESPIRATORY_TRACT | Status: DC

## 2014-08-30 MED ORDER — SODIUM CHLORIDE 0.9 % IJ SOLN
3.0000 mL | Freq: Two times a day (BID) | INTRAMUSCULAR | Status: DC
  Administered 2014-08-30 (×3): 3 mL via INTRAVENOUS

## 2014-08-30 MED ORDER — ALBUTEROL SULFATE (2.5 MG/3ML) 0.083% IN NEBU
2.5000 mg | INHALATION_SOLUTION | Freq: Two times a day (BID) | RESPIRATORY_TRACT | Status: DC
  Administered 2014-08-30: 2.5 mg via RESPIRATORY_TRACT
  Filled 2014-08-30: qty 3

## 2014-08-30 MED ORDER — PANTOPRAZOLE SODIUM 40 MG PO TBEC
40.0000 mg | DELAYED_RELEASE_TABLET | Freq: Every day | ORAL | Status: DC
  Administered 2014-08-30: 40 mg via ORAL
  Filled 2014-08-30: qty 1

## 2014-08-30 MED ORDER — METFORMIN HCL 500 MG PO TABS
1000.0000 mg | ORAL_TABLET | Freq: Every day | ORAL | Status: DC
  Administered 2014-08-30: 1000 mg via ORAL
  Filled 2014-08-30: qty 2

## 2014-08-30 MED ORDER — ALBUTEROL SULFATE HFA 108 (90 BASE) MCG/ACT IN AERS: 1.0000 | INHALATION_SPRAY | Freq: Two times a day (BID) | RESPIRATORY_TRACT | Status: DC

## 2014-08-30 MED ORDER — STROKE: EARLY STAGES OF RECOVERY BOOK
Freq: Once | Status: AC
  Administered 2014-08-30: 02:00:00

## 2014-08-30 MED ORDER — HYDROCODONE-ACETAMINOPHEN 5-325 MG PO TABS: 1.0000 | ORAL_TABLET | Freq: Four times a day (QID) | ORAL | Status: DC | PRN

## 2014-08-30 MED ORDER — TIOTROPIUM BROMIDE MONOHYDRATE 18 MCG IN CAPS
18.0000 ug | ORAL_CAPSULE | Freq: Every day | RESPIRATORY_TRACT | Status: DC
  Administered 2014-08-30: 18 ug via RESPIRATORY_TRACT
  Filled 2014-08-30: qty 5

## 2014-08-30 MED ORDER — INSULIN ASPART 100 UNIT/ML ~~LOC~~ SOLN
0.0000 [IU] | Freq: Three times a day (TID) | SUBCUTANEOUS | Status: DC
  Administered 2014-08-30: 3 [IU] via SUBCUTANEOUS
  Administered 2014-08-30: 5 [IU] via SUBCUTANEOUS
  Filled 2014-08-30: qty 3
  Filled 2014-08-30: qty 5

## 2014-08-30 MED ORDER — BUDESONIDE-FORMOTEROL FUMARATE 160-4.5 MCG/ACT IN AERO
2.0000 | INHALATION_SPRAY | Freq: Two times a day (BID) | RESPIRATORY_TRACT | Status: DC
  Filled 2014-08-30: qty 6

## 2014-08-30 MED ORDER — SODIUM CHLORIDE 0.9 % IJ SOLN: 3.0000 mL | INTRAMUSCULAR | Status: DC | PRN

## 2014-08-30 MED ORDER — ASPIRIN EC 81 MG PO TBEC
81.0000 mg | DELAYED_RELEASE_TABLET | Freq: Every day | ORAL | Status: DC
  Administered 2014-08-30: 81 mg via ORAL
  Filled 2014-08-30: qty 1

## 2014-08-30 MED ORDER — ENOXAPARIN SODIUM 40 MG/0.4ML ~~LOC~~ SOLN
40.0000 mg | SUBCUTANEOUS | Status: DC
Start: 2014-08-30 — End: 2014-08-30
  Administered 2014-08-30: 40 mg via SUBCUTANEOUS
  Filled 2014-08-30: qty 0.4

## 2014-08-30 MED ORDER — MUPIROCIN 2 % EX OINT
1.0000 "application " | TOPICAL_OINTMENT | Freq: Two times a day (BID) | CUTANEOUS | Status: DC
  Administered 2014-08-30: 1 via NASAL
  Filled 2014-08-30: qty 22

## 2014-08-30 NOTE — H&P (Addendum)
[facility] [place] NAME: [name] [name]    MR#:  [id number]  DATE OF BIRTH:  Jul 03, 1940  DATE OF ADMISSION:  08/30/2014  PRIMARY CARE PHYSICIAN: Miguel Aschoff, MD   REQUESTING/REFERRING PHYSICIAN: Schaevitz  CHIEF COMPLAINT:   Chief Complaint  Patient presents with  . Chest Pain    HISTORY OF PRESENT ILLNESS:  [name] [name]  is a 74 y.o. female who presents with acute onset of mild chest pain, and right lower extremity weakness. Patient states that she was in her normal state of health until this afternoon when she was making lunch, and had a sudden onset of central chest pain occurring in conjunction with onset of right lower extremely weakness. Patient states that her right leg was so weak that she could not stand or walk on it, even with the assistance of her cane and walker. She states that these symptoms are very similar to the symptoms she had when she had her stroke in 2006. She decided to come in to the ED for evaluation. Since arrival to the ED her right leg strength has improved, though she states it is still not back to baseline. As far as her chest pain, she denies any radiationdiaphoresis, nausea or vomiting, abdominal pain, dizziness, blurred vision. Her chest pain has resolved at this time. As far as her right leg weakness, she denies any associated sensory or coordination deficits. Initial workup in the ED largely within normal limits, including blood work within normal limits with 2 negative sets of cardiac enzymes so far, and a negative head CT. Hospitalists were called for admission for workup for possible stroke. Of note the patient does have a history of CAD, and poorly controlled (difficult to control) diabetes. Review of her MRI in 2006 showed a small left caudate nucleus stroke.  PAST MEDICAL HISTORY:   Past Medical History  Diagnosis Date  . CAD (coronary artery disease)     Minimal nonobstructive, cath 05/2004   . Hypertension   . Diabetes mellitus   . Hyperlipemia     Followed by Dr. Rosanna Randy  . Stroke     6 yrs ago  . COPD (chronic obstructive pulmonary disease)   . Shortness of breath   . Pneumonia     hx  . Macular degeneration     rt    PAST SURGICAL HISTORY:   Past Surgical History  Procedure Laterality Date  . Cardiac catheterization  05/2004  . Cholecystectomy    . Vesicovaginal fistula closure w/ tah    . Cataract extraction Left   . Back surgery  80's  . Anterior cervical decomp/discectomy fusion N/A 10/19/2013    Procedure: CERVICAL FIVE-SIX ANTERIOR CERVICAL DECOMPRESSION WITH FUSION INTERBODY PROSTHESIS PLATING AND PEEK CAGE;  Surgeon: Ophelia Charter, MD;  Location: [facility];  Service: Neurosurgery;  Laterality: N/A;  . Breast cyst excision Left     left negative   . Tonsillectomy and adenoidectomy    . Abdominal hysterectomy      SOCIAL HISTORY:   History  Substance Use Topics  . Smoking status: Current Every Day Smoker -- 0.50 packs/day for 54 years    Types: Cigarettes  . Smokeless tobacco: Never Used     Comment: smokes 3 cigs daily 07/31/14  . Alcohol Use: No    FAMILY HISTORY:   Family History  Problem Relation Age of Onset  . Stroke Mother     Massive  . Heart attack Father  Massive  . Heart attack Brother   . Lung cancer Maternal Grandfather   . Heart attack Paternal Grandmother     MI  . Heart attack Brother     DRUG ALLERGIES:   Allergies  Allergen Reactions  . Coconut Fatty Acids Swelling    Throat swells    MEDICATIONS AT HOME:   Prior to Admission medications   Medication Sig Start Date End Date Taking? Authorizing Provider  albuterol (PROVENTIL HFA;VENTOLIN HFA) 108 (90 BASE) MCG/ACT inhaler Inhale 1-2 puffs into the lungs 2 (two) times daily.  04/20/12  Yes Historical Provider, MD  aspirin 81 MG tablet Take 81 mg by mouth daily.   Yes Historical Provider, MD  atorvastatin (LIPITOR) 20 MG tablet Take 1 tablet by mouth  at bedtime. 07/22/14  Yes Historical Provider, MD  beta carotene w/minerals (OCUVITE) tablet Take 1 tablet by mouth daily.   Yes Historical Provider, MD  budesonide-formoterol (SYMBICORT) 160-4.5 MCG/ACT inhaler Inhale 2 puffs into the lungs 2 (two) times daily. 03/28/14  Yes Kathee Delton, MD  canagliflozin (INVOKANA) 300 MG TABS tablet Take 1 tablet by mouth daily. 02/14/14  Yes Historical Provider, MD  cholecalciferol (VITAMIN D) 1000 UNITS tablet Take 1,000 Units by mouth daily.   Yes Historical Provider, MD  gabapentin (NEURONTIN) 600 MG tablet Take 600 mg by mouth 2 (two) times daily.    Yes Historical Provider, MD  glimepiride (AMARYL) 4 MG tablet Take 4 mg by mouth 2 (two) times daily.    Yes Historical Provider, MD  HYDROcodone-acetaminophen (NORCO/VICODIN) 5-325 MG per tablet Take 1 tablet by mouth every 6 (six) hours as needed. 08/29/13  Yes Historical Provider, MD  isosorbide mononitrate (IMDUR) 30 MG 24 hr tablet Take 1 tablet by mouth  daily 07/23/14  Yes [name], MD  losartan-hydrochlorothiazide (HYZAAR) 100-25 MG per tablet Take 1 tablet by mouth daily.   Yes Historical Provider, MD  magnesium oxide (MAG-OX) 400 (241.3 MG) MG tablet Take 1 tablet by mouth 2 (two) times daily. 08/04/13  Yes Historical Provider, MD  metFORMIN (GLUCOPHAGE) 1000 MG tablet Take 1,000 mg by mouth daily.    Yes Historical Provider, MD  Multiple Vitamins-Minerals (CENTRUM SILVER ADULT 50+ PO) Take 1 tablet by mouth daily. 05/15/10  Yes Historical Provider, MD  nitroGLYCERIN (NITROSTAT) 0.4 MG SL tablet Place 1 tablet (0.4 mg total) under the tongue every 5 (five) minutes as needed for chest pain. 07/23/14  Yes [name], MD  omeprazole (PRILOSEC) 20 MG capsule Take 20 mg by mouth daily.     Yes Historical Provider, MD  Potassium Chloride (KLOR-CON 10 PO) Take 3 tablets by mouth daily.  08/29/13  Yes Historical Provider, MD  saxagliptin HCl (ONGLYZA) 5 MG TABS tablet Take 5 mg by mouth daily.     Yes  Historical Provider, MD  Tiotropium Bromide Monohydrate (SPIRIVA RESPIMAT) 2.5 MCG/ACT AERS Inhale 2 puffs into the lungs daily. 11/27/13  Yes Kathee Delton, MD  amitriptyline (ELAVIL) 10 MG tablet Take 1 mg by mouth daily. 07/24/14   Historical Provider, MD  diazepam (VALIUM) 5 MG tablet Take 1 tablet (5 mg total) by mouth every 6 (six) hours as needed for muscle spasms. Patient not taking: Reported on 08/29/2014 10/20/13   Newman Pies, MD  docusate sodium 100 MG CAPS Take 100 mg by mouth 2 (two) times daily. Patient not taking: Reported on 08/29/2014 10/20/13   Newman Pies, MD  glucose blood test strip as directed. 06/15/11   Historical  Provider, MD  [place] as directed. 06/15/11   Historical Provider, MD  oxyCODONE-acetaminophen (PERCOCET/ROXICET) 5-325 MG per tablet Take 1-2 tablets by mouth every 4 (four) hours as needed for moderate pain. Patient not taking: Reported on 08/29/2014 10/20/13   Newman Pies, MD    REVIEW OF SYSTEMS:  Review of Systems  Constitutional: Negative for fever, chills, weight loss and malaise/fatigue.  HENT: Negative for ear pain, hearing loss and tinnitus.   Eyes: Negative for blurred vision, double vision, pain and redness.  Respiratory: Negative for cough, hemoptysis and shortness of breath.   Cardiovascular: Positive for chest pain (Transient). Negative for palpitations, orthopnea and leg swelling.  Gastrointestinal: Negative for nausea, vomiting, abdominal pain, diarrhea and constipation.  Genitourinary: Negative for dysuria, frequency and hematuria.  Musculoskeletal: Negative for back pain, joint pain and neck pain.  Skin:       No acne, rash, or lesions  Neurological: Positive for focal weakness (right lower extremity). Negative for dizziness, tremors, sensory change, speech change, loss of consciousness and weakness.  Endo/Heme/Allergies: Negative for polydipsia. Does not bruise/bleed easily.  Psychiatric/Behavioral: Negative for  depression. The patient is not nervous/anxious and does not have insomnia.      VITAL SIGNS:   Filed Vitals:   08/29/14 2130 08/29/14 2200 08/29/14 2230 08/29/14 2330  BP: 133/70 146/81 159/79 134/61  Pulse: 71 73 74 74  Temp:      TempSrc:      Resp: '14 15 16 16  '$ Height:      Weight:      SpO2: 96% 99% 99% 98%   Wt Readings from Last 3 Encounters:  08/29/14 70.308 kg (155 lb)  08/27/14 70.421 kg (155 lb 4 oz)  07/25/14 69.854 kg (154 lb)    PHYSICAL EXAMINATION:  Physical Exam  Constitutional: She is oriented to person, place, and time. She appears well-developed and well-nourished. No distress.  HENT:  Head: Normocephalic and atraumatic.  Mouth/Throat: Oropharynx is clear and moist.  Eyes: Conjunctivae and EOM are normal. Pupils are equal, round, and reactive to light. No scleral icterus.  Neck: Normal range of motion. Neck supple. No JVD present. No thyromegaly present.  No carotid bruit.  Cardiovascular: Normal rate, regular rhythm and intact distal pulses.  Exam reveals no gallop and no friction rub.   No murmur heard. Respiratory: Effort normal and breath sounds normal. No respiratory distress. She has no wheezes. She has no rales.  GI: Soft. Bowel sounds are normal. She exhibits no distension. There is no tenderness.  Musculoskeletal: Normal range of motion. She exhibits no edema.  No arthritis, no gout  Lymphadenopathy:    She has no cervical adenopathy.  Neurological: She is alert and oriented to person, place, and time. She has normal reflexes. No cranial nerve deficit.  No dysarthria, no aphasia, right lower extremity strength 4/5, strength in all other extremities 5/5, no sensory deficit, no dysmetria, gait exam deferred.  Skin: Skin is warm and dry. No rash noted. No erythema.  Psychiatric: She has a normal mood and affect. Her behavior is normal. Judgment and thought content normal.    LABORATORY PANEL:   CBC  Recent Labs Lab 08/29/14 1858  WBC 11.1*   HGB 14.7  HCT 42.8  PLT 281   ------------------------------------------------------------------------------------------------------------------  Chemistries   Recent Labs Lab 08/29/14 1858  NA 138  K 4.2  CL 95*  CO2 31  GLUCOSE 172*  BUN 21*  CREATININE 1.11*  CALCIUM 9.1   ------------------------------------------------------------------------------------------------------------------  Cardiac Enzymes  Recent Labs Lab 08/29/14 2228  TROPONINI <0.03   ------------------------------------------------------------------------------------------------------------------  RADIOLOGY:  Dg Chest 2 View  08/29/2014   CLINICAL DATA:  Left-sided chest pain and weakness. History of COPD, CHF, hypertension, CAD and diabetes.  EXAM: CHEST  2 VIEW  COMPARISON:  06/14/2014; 12/28/2013; 10/16/2013  FINDINGS: Grossly unchanged cardiac silhouette and mediastinal contours. There is unchanged diffuse slightly nodular thickening of the pulmonary interstitium. Bibasilar heterogeneous opacities are unchanged, left greater than right. No new focal airspace opacities. No pleural effusion or pneumothorax. No evidence of edema. No acute osseus abnormalities. Post cholecystectomy. Post lower cervical ACDF.  IMPRESSION: Hyperexpanded lungs and bronchitic change without acute cardiopulmonary disease.   Electronically Signed   By: Sandi Mariscal M.D.   On: 08/29/2014 22:04   Ct Head Wo Contrast  08/29/2014   CLINICAL DATA:  Left lower extremity weakness. History of CVA, hypertension and dizziness.  EXAM: CT HEAD WITHOUT CONTRAST  TECHNIQUE: Contiguous axial images were obtained from the base of the skull through the vertex without intravenous contrast.  COMPARISON:  09/05/2007; 05/19/2004  FINDINGS: Gray-white differentiation is maintained. No CT evidence of acute large territory infarct. No intraparenchymal or extra-axial mass or hemorrhage. Normal size and configuration of the ventricles and basilar cisterns.  No midline shift. Intracranial atherosclerosis. There is under pneumatization of the right frontal sinus. The remaining paranasal sinuses and mastoid air cells are normally aerated. No air-fluid levels. Regional soft tissues appear normal. No displaced calvarial fracture.  IMPRESSION: Negative noncontrast head CT.   Electronically Signed   By: Sandi Mariscal M.D.   On: 08/29/2014 23:14    EKG:   Orders placed or performed during the hospital encounter of 08/29/14  . ED EKG (<72mns upon arrival to the ED)  . ED EKG (<140ms upon arrival to the ED)    IMPRESSION AND PLAN:  Principal Problem:   CVA (cerebral infarction) - patient had an old stroke in 2006, symptoms this time consistent with her symptoms and presentation at that time. She'll be admitted for workup for stroke, including MRI, carotid Dopplers, neuro consult, fasting lipid panel, hemoglobin A1c, echocardiogram. Permissive hypertension for the first 24 hours to a goal of blood pressure less than 220/110. Active Problems:   CAD (coronary artery disease) - continue appropriate home medications for this problem. We will hold any medications which will lower her blood pressure at this time, to allow for permissive hypertension as stated above. These medicines can be restarted tomorrow. We will continue trending her cardiac enzymes for minimum of 3 total sets, they've been negative so far.   COPD (chronic obstructive pulmonary disease) with emphysema - chronic stable problem, continue home inhalers.   Diabetes mellitus with polyneuropathy - difficult to control, patient is on a number of medications for this. We will check a hemoglobin A1c here. We will keep her on her home dose of metformin, as well as sliding scale insulin, and a heart healthy/carb modified diet.   Essential (primary) hypertension - chronic stable problem, we will hold antihypertensives for the first 24 hours to allow for permissive hypertension as above. These medications can  be restarted once outside of that window.   HLD (hyperlipidemia) - on it problem, check fasting lipid profile as above, continue home cholesterol medication for now with appropriate adjustments based on her lipid panel results.   All the records are reviewed and case discussed with ED provider. Management plans discussed with the patient and/or family.  DVT PROPHYLAXIS: SubQ lovenox  ADMISSION STATUS: Inpatient  CODE STATUS: Full  TOTAL TIME TAKING CARE OF THIS PATIENT: 50 minutes.    Daine Gunther [place] 08/30/2014, 12:17 AM  [facility]  Office  [phone]  CC: Primary care physician; Miguel Aschoff, MD

## 2014-08-30 NOTE — Evaluation (Signed)
Occupational Therapy Evaluation Patient Details Name: RINNAH PEPPEL MRN: [id number] DOB: 1940/05/24 Today's Date: 08/30/2014    History of Present Illness 74 yo female with onset of chest pain and RLE weakness was referred to OT with negative CT of head and positive MRSA findings.  PMHx:  poorly controlled DM, old L caudate nucleus stroke, COPD, SOB, PNA, back surgery, CAD   Clinical Impression   Pt is 74 year old woman who came in with sudden onset of RLE weakness which has improved but not resolved completely.  She had a negative CT scan but positive for MRSA and on precautions.  She does not present with any changes in ADLs and does not have any decrease in sensation, coordination or gross motor skills.  She was able to complete all dressing skills sitting EOB and is anxious to go home.  Pt seen for OT evaluation only and no further OT needs.     Follow Up Recommendations  No OT follow up    Equipment Recommendations  Tub/shower seat    Recommendations for Other Services       Precautions / Restrictions Precautions Precaution Comments: on 4L of O2 via nasal cannula Restrictions Weight Bearing Restrictions: No      Mobility Bed Mobility                  Transfers                      Balance                                            ADL                                         General ADL Comments: Pt able to complete all ADlLs in order to get dressed sitiing EOB wtihout any difficulties, only supervision for balance and RLE weakness.  Pt does not need any assistive devices for ADLs.  Rec using a shower chair with back and a BSC over toilet to increase safety if weakness in LE cntinues     Vision     Perception     Praxis      Pertinent Vitals/Pain Pain Assessment: No/denies pain     Hand Dominance Right   Extremity/Trunk Assessment Upper Extremity Assessment Upper Extremity Assessment: Overall WFL for  tasks assessed   Lower Extremity Assessment Lower Extremity Assessment: Defer to PT evaluation       Communication Communication Communication: No difficulties   Cognition Arousal/Alertness: Awake/alert Behavior During Therapy: WFL for tasks assessed/performed Overall Cognitive Status: Within Functional Limits for tasks assessed                     General Comments       Exercises       Shoulder Instructions      Home Living Family/patient expects to be discharged to:: Private residence Living Arrangements: Spouse/significant other Available Help at Discharge: Family Type of Home: House Home Access: Stairs to enter Technical brewer of Steps: 1   Home Layout: One level     Bathroom Shower/Tub: Tub/shower unit Shower/tub characteristics: Architectural technologist: Standard Bathroom Accessibility: Yes   Home Equipment: Environmental consultant - 2 wheels;Cane - single point  Prior Functioning/Environment Level of Independence: Independent             OT Diagnosis:     OT Problem List:     OT Treatment/Interventions:      OT Goals(Current goals can be found in the care plan section) Acute Rehab OT Goals Patient Stated Goal: to go home  OT Frequency:     Barriers to D/C:            Co-evaluation              End of Session Nurse Communication:  (pt wanted to sit at EOB and cleared with nsg that was OK.  Pt thought she was going home today but per NSG there aren't any DC orders yet.)  Activity Tolerance: Patient limited by fatigue Patient left: in bed;with call bell/phone within reach;with family/visitor present   Time: 1500-1526 OT Time Calculation (min): 26 min Charges:  OT General Charges $OT Visit: 1 Procedure OT Evaluation $Initial OT Evaluation Tier I: 1 Procedure OT Treatments $Self Care/Home Management : 8-22 mins G-Codes:    Wofford,Susan September 11, 2014, 3:37 PM  Chrys Racer, OTR/L

## 2014-08-30 NOTE — Consult Note (Signed)
Brief neurology note:   74 y.o. female who presents with acute onset of mild chest pain, and right lower extremity weakness. Pt's RLE pain and weakness has improved since admission. Pt is complaining of cramps in the RLE which improved.   Imaging reviewed no acute abnormalities found on MRI.   Likely symptoms are related to cramps/musculoskeletal. D/c planning from neuro stand point. D/w pt over phone.  Leotis Pain

## 2014-08-30 NOTE — Discharge Instructions (Signed)
°  DIET:  Cardiac diet  DISCHARGE CONDITION:  Stable  ACTIVITY:  Activity as tolerated  OXYGEN:  Home Oxygen: Yes.     Oxygen Delivery: 3 liters/min via Patient connected to nasal cannula oxygen  DISCHARGE LOCATION:  home   If you experience worsening of your admission symptoms, develop shortness of breath, life threatening emergency, suicidal or homicidal thoughts you must seek medical attention immediately by calling 911 or calling your MD immediately  if symptoms less severe.  You Must read complete instructions/literature along with all the possible adverse reactions/side effects for all the Medicines you take and that have been prescribed to you. Take any new Medicines after you have completely understood and accpet all the possible adverse reactions/side effects.   Please note  You were cared for by a hospitalist during your hospital stay. If you have any questions about your discharge medications or the care you received while you were in the hospital after you are discharged, you can call the unit and asked to speak with the hospitalist on call if the hospitalist that took care of you is not available. Once you are discharged, your primary care physician will handle any further medical issues. Please note that NO REFILLS for any discharge medications will be authorized once you are discharged, as it is imperative that you return to your primary care physician (or establish a relationship with a primary care physician if you do not have one) for your aftercare needs so that they can reassess your need for medications and monitor your lab values.

## 2014-08-30 NOTE — Care Management (Signed)
Order present for CM assessment for discharge planning.  Admitted for sx concerning for cva.  Brain MRI is negative. Patient says she is still having some weakness on her right side.   Has history of previous CVA without deficits.   Presenting sx have resolved. PT , OT and neuro consults pending.  Patient presents from home where she lives with her husband.  Has chronic home 02 through [place].  Says she is independent in all her adls.  Confirmed address and contact information .  Patient is current with her PCP- gilbert.  It is possible home health may be recommended.

## 2014-08-30 NOTE — Progress Notes (Signed)
Notified Dr.Willis about positive MRSA pcr screening. No new orders given.

## 2014-08-31 NOTE — Discharge Summary (Signed)
[facility] NAME: Jona Erkkila    MR#:  [id number]  DATE OF BIRTH:  21-Aug-1940  DATE OF ADMISSION:  08/29/2014 ADMITTING PHYSICIAN: Lance Coon, MD  DATE OF DISCHARGE: 08/30/2014  4:47 PM  PRIMARY CARE PHYSICIAN: Miguel Aschoff, MD    ADMISSION DIAGNOSIS:  Weakness of left lower extremity [R29.898] Cerebral infarction due to unspecified mechanism [I63.9]  DISCHARGE DIAGNOSIS:  Active Problems:   COPD (chronic obstructive pulmonary disease) with emphysema   CAD (coronary artery disease)   Diabetes mellitus with polyneuropathy   Essential (primary) hypertension   HLD (hyperlipidemia)   Carotid stenosis Muscle spasms  SECONDARY DIAGNOSIS:   Past Medical History  Diagnosis Date  . CAD (coronary artery disease)     Minimal nonobstructive, cath 05/2004  . Hypertension   . Diabetes mellitus   . Hyperlipemia     Followed by Dr. Rosanna Randy  . Stroke     6 yrs ago  . COPD (chronic obstructive pulmonary disease)   . Shortness of breath   . Pneumonia     hx  . Macular degeneration     rt    HOSPITAL COURSE:   ADMITTING HISTORY Neriyah Guedes is a 74 y.o. female who presents with acute onset of mild chest pain, and right lower extremity weakness. Patient states that she was in her normal state of health until this afternoon when she was making lunch, and had a sudden onset of central chest pain occurring in conjunction with onset of right lower extremely weakness. Patient states that her right leg was so weak that she could not stand or walk on it, even with the assistance of her cane and walker. She states that these symptoms are very similar to the symptoms she had when she had her stroke in 2006. She decided to come in to the ED for evaluation. Since arrival to the ED her right leg strength has improved, though she states it is still not back to baseline. As far as her chest pain, she denies any radiationdiaphoresis,  nausea or vomiting, abdominal pain, dizziness, blurred vision. Her chest pain has resolved at this time. As far as her right leg weakness, she denies any associated sensory or coordination deficits. Initial workup in the ED largely within normal limits, including blood work within normal limits with 2 negative sets of cardiac enzymes so far, and a negative head CT. Hospitalists were called for admission for workup for possible stroke. Of note the patient does have a history of CAD, and poorly controlled (difficult to control) diabetes. Review of her MRI in 2006 showed a small left caudate nucleus stroke.  HOSPITAL COURSE Patient admitted onto telemetry floor, neuro checks scheduled, had MRI of the brain which showed no acute stroke. Did show remote stroke. Echocardiogram was normal. Carotid Doppler did show a left sided carotid stenosis greater than 70%. Patient is being referred to Dr. dew as outpatient after discharge for her carotid stenosis. Patient mentions that she was told that she had narrowing of her neck arteries in the past. On discussing with Dr. Levester Fresh who spoke with the patient her pain was thought to be likely from muscle spasms and not stroke. ASA, Statin. Symptoms resolved by time of discharge.   CONSULTS OBTAINED:     DRUG ALLERGIES:   Allergies  Allergen Reactions  . Coconut Fatty Acids Swelling    Throat swells    DISCHARGE MEDICATIONS:   Discharge Medication List as of 08/30/2014  4:27 PM    START taking these medications   Details  mupirocin ointment (BACTROBAN) 2 % Place 1 application into the nose 2 (two) times daily., Starting 08/30/2014, Until Tue 09/04/14, Print      CONTINUE these medications which have NOT CHANGED   Details  albuterol (PROVENTIL HFA;VENTOLIN HFA) 108 (90 BASE) MCG/ACT inhaler Inhale 1-2 puffs into the lungs 2 (two) times daily. , Starting 04/20/2012, Until Discontinued, Historical Med    aspirin 81 MG tablet Take 81 mg by mouth daily., Until  Discontinued, Historical Med    atorvastatin (LIPITOR) 20 MG tablet Take 1 tablet by mouth at bedtime., Starting 07/22/2014, Until Discontinued, Historical Med    !! beta carotene w/minerals (OCUVITE) tablet Take 1 tablet by mouth daily., Until Discontinued, Historical Med    budesonide-formoterol (SYMBICORT) 160-4.5 MCG/ACT inhaler Inhale 2 puffs into the lungs 2 (two) times daily., Starting 03/28/2014, Until Discontinued, Print    canagliflozin (INVOKANA) 300 MG TABS tablet Take 1 tablet by mouth daily., Starting 02/14/2014, Until Discontinued, Historical Med    cholecalciferol (VITAMIN D) 1000 UNITS tablet Take 1,000 Units by mouth daily., Until Discontinued, Historical Med    gabapentin (NEURONTIN) 600 MG tablet Take 600 mg by mouth 2 (two) times daily. , Until Discontinued, Historical Med    glimepiride (AMARYL) 4 MG tablet Take 4 mg by mouth 2 (two) times daily. , Until Discontinued, Historical Med    HYDROcodone-acetaminophen (NORCO/VICODIN) 5-325 MG per tablet Take 1 tablet by mouth every 6 (six) hours as needed., Starting 08/29/2013, Until Discontinued, Historical Med    isosorbide mononitrate (IMDUR) 30 MG 24 hr tablet Take 1 tablet by mouth  daily, Normal    losartan-hydrochlorothiazide (HYZAAR) 100-25 MG per tablet Take 1 tablet by mouth daily., Until Discontinued, Historical Med    magnesium oxide (MAG-OX) 400 (241.3 MG) MG tablet Take 1 tablet by mouth 2 (two) times daily., Starting 08/04/2013, Until Discontinued, Historical Med    metFORMIN (GLUCOPHAGE) 1000 MG tablet Take 1,000 mg by mouth daily. , Until Discontinued, Historical Med    !! Multiple Vitamins-Minerals (CENTRUM SILVER ADULT 50+ PO) Take 1 tablet by mouth daily., Starting 05/15/2010, Until Discontinued, Historical Med    nitroGLYCERIN (NITROSTAT) 0.4 MG SL tablet Place 1 tablet (0.4 mg total) under the tongue every 5 (five) minutes as needed for chest pain., Starting 07/23/2014, Until Discontinued, Normal     omeprazole (PRILOSEC) 20 MG capsule Take 20 mg by mouth daily.  , Until Discontinued, Historical Med    Potassium Chloride (KLOR-CON 10 PO) Take 3 tablets by mouth daily. , Starting 08/29/2013, Until Discontinued, Historical Med    saxagliptin HCl (ONGLYZA) 5 MG TABS tablet Take 5 mg by mouth daily.  , Until Discontinued, Historical Med    Tiotropium Bromide Monohydrate (SPIRIVA RESPIMAT) 2.5 MCG/ACT AERS Inhale 2 puffs into the lungs daily., Starting 11/27/2013, Until Discontinued, Normal    amitriptyline (ELAVIL) 10 MG tablet Take 1 mg by mouth daily., Starting 07/24/2014, Until Discontinued, Historical Med    diazepam (VALIUM) 5 MG tablet Take 1 tablet (5 mg total) by mouth every 6 (six) hours as needed for muscle spasms., Starting 10/20/2013, Until Discontinued, Print    docusate sodium 100 MG CAPS Take 100 mg by mouth 2 (two) times daily., Starting 10/20/2013, Until Discontinued, Normal    glucose blood test strip as directed., Historical Med    LANCETS ULTRA THIN MISC as directed., Starting 06/15/2011, Until Discontinued, Historical Med    oxyCODONE-acetaminophen (PERCOCET/ROXICET) 5-325 MG per tablet Take  1-2 tablets by mouth every 4 (four) hours as needed for moderate pain., Starting 10/20/2013, Until Discontinued, Print     !! - Potential duplicate medications found. Please discuss with provider.         Today    VITAL SIGNS:  Blood pressure 153/53, pulse 71, temperature 98.1 F (36.7 C), temperature source Oral, resp. rate 20, height '5\' 4"'$  (1.626 m), weight 70.308 kg (155 lb), SpO2 97 %.  I/O:   Intake/Output Summary (Last 24 hours) at 08/31/14 1234 Last data filed at 08/30/14 1500  Gross per 24 hour  Intake      0 ml  Output    200 ml  Net   -200 ml    PHYSICAL EXAMINATION:  Physical Exam  GENERAL:  74 y.o.-year-old patient lying in the bed with no acute distress.  LUNGS: Normal breath sounds bilaterally, no wheezing, rales,rhonchi or crepitation. No use of  accessory muscles of respiration.  CARDIOVASCULAR: S1, S2 normal. No murmurs, rubs, or gallops.  ABDOMEN: Soft, non-tender, non-distended. Bowel sounds present. No organomegaly or mass.  NEUROLOGIC: Moves all 4 extremities. PSYCHIATRIC: The patient is alert and oriented x 3.  SKIN: No obvious rash, lesion, or ulcer.   DATA REVIEW:   CBC  Recent Labs Lab 08/29/14 1858  WBC 11.1*  HGB 14.7  HCT 42.8  PLT 281    Chemistries   Recent Labs Lab 08/29/14 1858  NA 138  K 4.2  CL 95*  CO2 31  GLUCOSE 172*  BUN 21*  CREATININE 1.11*  CALCIUM 9.1    Cardiac Enzymes  Recent Labs Lab 08/30/14 0506  TROPONINI <0.03    Microbiology Results  Results for orders placed or performed during the hospital encounter of 08/29/14  MRSA PCR Screening     Status: Abnormal   Collection Time: 08/30/14  1:52 AM  Result Value Ref Range Status   MRSA by PCR POSITIVE (A) NEGATIVE Final    Comment:        The GeneXpert MRSA Assay (FDA approved for NASAL specimens only), is one component of a comprehensive MRSA colonization surveillance program. It is not intended to diagnose MRSA infection nor to guide or monitor treatment for MRSA infections. C/ TO MARCELLA TURNER '@0327'$  08/30/14 BY AJO     RADIOLOGY:  Dg Chest 2 View  08/29/2014   CLINICAL DATA:  Left-sided chest pain and weakness. History of COPD, CHF, hypertension, CAD and diabetes.  EXAM: CHEST  2 VIEW  COMPARISON:  06/14/2014; 12/28/2013; 10/16/2013  FINDINGS: Grossly unchanged cardiac silhouette and mediastinal contours. There is unchanged diffuse slightly nodular thickening of the pulmonary interstitium. Bibasilar heterogeneous opacities are unchanged, left greater than right. No new focal airspace opacities. No pleural effusion or pneumothorax. No evidence of edema. No acute osseus abnormalities. Post cholecystectomy. Post lower cervical ACDF.  IMPRESSION: Hyperexpanded lungs and bronchitic change without acute cardiopulmonary  disease.   Electronically Signed   By: Sandi Mariscal M.D.   On: 08/29/2014 22:04   Ct Head Wo Contrast  08/29/2014   CLINICAL DATA:  Left lower extremity weakness. History of CVA, hypertension and dizziness.  EXAM: CT HEAD WITHOUT CONTRAST  TECHNIQUE: Contiguous axial images were obtained from the base of the skull through the vertex without intravenous contrast.  COMPARISON:  09/05/2007; 05/19/2004  FINDINGS: Gray-white differentiation is maintained. No CT evidence of acute large territory infarct. No intraparenchymal or extra-axial mass or hemorrhage. Normal size and configuration of the ventricles and basilar cisterns. No midline shift. Intracranial atherosclerosis.  There is under pneumatization of the right frontal sinus. The remaining paranasal sinuses and mastoid air cells are normally aerated. No air-fluid levels. Regional soft tissues appear normal. No displaced calvarial fracture.  IMPRESSION: Negative noncontrast head CT.   Electronically Signed   By: Sandi Mariscal M.D.   On: 08/29/2014 23:14   Mri Brain Without Contrast  08/30/2014   CLINICAL DATA:  74 year old hypertensive diabetic female with left lower extremity weakness. History of prior infarct. Dizziness. Subsequent encounter.  EXAM: MRI HEAD WITHOUT CONTRAST  MRA HEAD WITHOUT CONTRAST  TECHNIQUE: Multiplanar, multiecho pulse sequences of the brain and surrounding structures were obtained without intravenous contrast. Angiographic images of the head were obtained using MRA technique without contrast.  COMPARISON:  08/29/2014 CT.  05/20/2004 MR.  FINDINGS: MRI HEAD FINDINGS  No acute infarct.  Altered signal intensity of cerebral spinal fluid on FLAIR sequence most likely related to patient's supplemental oxygen. Subarachnoid hemorrhage or proteinaceous material such as related to meningitis can cause a similar appearance but does not appear to be case clinically.  No hydrocephalus.  No intracranial mass lesion noted on this unenhanced exam.  Major  intracranial vascular structures are patent.  Post left lens replacement.  Minimal exophthalmos.  C3-4 mild bulge mild spinal stenosis. Cervical medullary junction, pituitary region and pineal region unremarkable.  Partial opacification sphenoid sinus air cells.  MRA HEAD FINDINGS  Mild narrowing supraclinoid segment of the left internal carotid artery. Fetal contribution to formation of the left posterior cerebral artery.  Mild narrowing cavernous segment right internal carotid artery.  Moderate focal narrowing proximal A1 segment right anterior cerebral artery.  Mild to moderate focal narrowing proximal M1 segment left middle cerebral artery.  Middle cerebral artery and A2 segment anterior cerebral artery mild branch vessel irregularity bilaterally.  Mild narrowing and irregularity distal right vertebral artery.  Mild narrowing proximal basilar artery.  Nonvisualized anterior inferior cerebellar arteries.  Moderate narrowing P1 -P2 segment right posterior cerebral artery.  Posterior cerebral artery distal branch vessel mild to moderate narrowing and irregularity bilaterally.  Minimal bulge left lateral aspect of the basilar tip without discrete saccular aneurysm.  IMPRESSION: No acute infarct.  Intracranial atherosclerotic type changes involving intracranial vasculature as detailed above.   Electronically Signed   By: Genia Del M.D.   On: 08/30/2014 11:31   US Carotid Bilateral  08/30/2014   CLINICAL DATA:  Cerebrovascular accident.  EXAM: BILATERAL CAROTID DUPLEX ULTRASOUND  TECHNIQUE: Pearline Cables scale imaging, color Doppler and duplex ultrasound were performed of bilateral carotid and vertebral arteries in the neck.  COMPARISON:  None.  FINDINGS: Criteria: Quantification of carotid stenosis is based on velocity parameters that correlate the residual internal carotid diameter with NASCET-based stenosis levels, using the diameter of the distal internal carotid lumen as the denominator for stenosis measurement.   The following velocity measurements were obtained:  RIGHT  ICA:  129/33 cm/sec  CCA:  24/40 cm/sec  SYSTOLIC ICA/CCA RATIO:  1.3  DIASTOLIC ICA/CCA RATIO:  1.4  ECA:  104 cm/sec  LEFT  ICA:  296/106 cm/sec  CCA:  10/27 cm/sec  SYSTOLIC ICA/CCA RATIO:  4.4  DIASTOLIC ICA/CCA RATIO:  6.1  ECA:  118 cm/sec  RIGHT CAROTID ARTERY: Moderate eccentric plaque formation is noted in the right carotid bulb and proximal right internal carotid artery which may represent 50-69% stenosis based on ultrasound and Doppler criteria.  RIGHT VERTEBRAL ARTERY:  Antegrade flow is noted.  LEFT CAROTID ARTERY: Moderate concentric plaque formation is noted in the proximal  left internal carotid artery consistent with greater than 70% diameter stenosis based on ultrasound and Doppler criteria.  LEFT VERTEBRAL ARTERY:  Antegrade flow is noted.  IMPRESSION: Moderate eccentric plaque formation is noted in the proximal right internal carotid artery which may represent 50-69% stenosis based on ultrasound and Doppler criteria.  Moderate concentric plaque formation is noted in the proximal left internal carotid artery consistent with greater than 70% diameter stenosis based on ultrasound and Doppler criteria.   Electronically Signed   By: Marijo Conception, M.D.   On: 08/30/2014 11:48   Mr Mra Head/brain Wo Cm  08/30/2014   CLINICAL DATA:  74 year old hypertensive diabetic female with left lower extremity weakness. History of prior infarct. Dizziness. Subsequent encounter.  EXAM: MRI HEAD WITHOUT CONTRAST  MRA HEAD WITHOUT CONTRAST  TECHNIQUE: Multiplanar, multiecho pulse sequences of the brain and surrounding structures were obtained without intravenous contrast. Angiographic images of the head were obtained using MRA technique without contrast.  COMPARISON:  08/29/2014 CT.  05/20/2004 MR.  FINDINGS: MRI HEAD FINDINGS  No acute infarct.  Altered signal intensity of cerebral spinal fluid on FLAIR sequence most likely related to patient's  supplemental oxygen. Subarachnoid hemorrhage or proteinaceous material such as related to meningitis can cause a similar appearance but does not appear to be case clinically.  No hydrocephalus.  No intracranial mass lesion noted on this unenhanced exam.  Major intracranial vascular structures are patent.  Post left lens replacement.  Minimal exophthalmos.  C3-4 mild bulge mild spinal stenosis. Cervical medullary junction, pituitary region and pineal region unremarkable.  Partial opacification sphenoid sinus air cells.  MRA HEAD FINDINGS  Mild narrowing supraclinoid segment of the left internal carotid artery. Fetal contribution to formation of the left posterior cerebral artery.  Mild narrowing cavernous segment right internal carotid artery.  Moderate focal narrowing proximal A1 segment right anterior cerebral artery.  Mild to moderate focal narrowing proximal M1 segment left middle cerebral artery.  Middle cerebral artery and A2 segment anterior cerebral artery mild branch vessel irregularity bilaterally.  Mild narrowing and irregularity distal right vertebral artery.  Mild narrowing proximal basilar artery.  Nonvisualized anterior inferior cerebellar arteries.  Moderate narrowing P1 -P2 segment right posterior cerebral artery.  Posterior cerebral artery distal branch vessel mild to moderate narrowing and irregularity bilaterally.  Minimal bulge left lateral aspect of the basilar tip without discrete saccular aneurysm.  IMPRESSION: No acute infarct.  Intracranial atherosclerotic type changes involving intracranial vasculature as detailed above.   Electronically Signed   By: Genia Del M.D.   On: 08/30/2014 11:31      Management plans discussed with the patient, family and they are in agreement.  CODE STATUS: FULL CODE  TOTAL TIME TAKING CARE OF THIS PATIENT ON DAY OF DISCHARGE: 35 minutes.    Hillary Bow R M.D on 08/31/2014 at 12:34 PM  Between 7am to 6pm - Pager - [phone]  After 6pm go  to [email or web address] - password [email or web address] [facility]  Office  [phone]  CC: Primary care physician; Miguel Aschoff, MD

## 2014-09-06 DIAGNOSIS — H3531 Nonexudative age-related macular degeneration: Secondary | ICD-10-CM | POA: Diagnosis not present

## 2014-09-07 ENCOUNTER — Encounter: Payer: Self-pay | Admitting: Family Medicine

## 2014-09-07 ENCOUNTER — Ambulatory Visit (INDEPENDENT_AMBULATORY_CARE_PROVIDER_SITE_OTHER): Payer: Medicare Other | Admitting: Family Medicine

## 2014-09-07 VITALS — BP 122/58 | HR 78 | Temp 97.7°F | Resp 16 | Wt 155.0 lb

## 2014-09-07 DIAGNOSIS — I639 Cerebral infarction, unspecified: Secondary | ICD-10-CM

## 2014-09-07 DIAGNOSIS — Z09 Encounter for follow-up examination after completed treatment for conditions other than malignant neoplasm: Secondary | ICD-10-CM

## 2014-09-07 DIAGNOSIS — Z720 Tobacco use: Secondary | ICD-10-CM | POA: Diagnosis not present

## 2014-09-07 DIAGNOSIS — M545 Low back pain, unspecified: Secondary | ICD-10-CM
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
## 2014-09-10 ENCOUNTER — Inpatient Hospital Stay: Payer: Self-pay | Admitting: Family Medicine

## 2014-09-14 DIAGNOSIS — I10 Essential (primary) hypertension: Secondary | ICD-10-CM | POA: Diagnosis not present

## 2014-09-14 DIAGNOSIS — I6529 Occlusion and stenosis of unspecified carotid artery: Secondary | ICD-10-CM | POA: Diagnosis not present

## 2014-09-14 DIAGNOSIS — I639 Cerebral infarction, unspecified: Secondary | ICD-10-CM | POA: Diagnosis not present

## 2014-09-14 DIAGNOSIS — F17200 Nicotine dependence, unspecified, uncomplicated: Secondary | ICD-10-CM | POA: Diagnosis not present

## 2014-09-14 DIAGNOSIS — E785 Hyperlipidemia, unspecified: Secondary | ICD-10-CM | POA: Diagnosis not present

## 2014-09-17 ENCOUNTER — Other Ambulatory Visit: Payer: Self-pay | Admitting: Vascular Surgery

## 2014-09-17 DIAGNOSIS — I6523 Occlusion and stenosis of bilateral carotid arteries: Secondary | ICD-10-CM

## 2014-09-17 DIAGNOSIS — I6322 Cerebral infarction due to unspecified occlusion or stenosis of basilar arteries: Secondary | ICD-10-CM

## 2014-09-25 ENCOUNTER — Ambulatory Visit
Admission: RE | Admit: 2014-09-25 | Discharge: 2014-09-25 | Disposition: A | Discharge status: Home / Self Care | Payer: Medicare Other | Source: Ambulatory Visit | Attending: Vascular Surgery | Admitting: Vascular Surgery

## 2014-09-25 DIAGNOSIS — I6522 Occlusion and stenosis of left carotid artery: Secondary | ICD-10-CM | POA: Diagnosis not present

## 2014-09-25 DIAGNOSIS — J449 Chronic obstructive pulmonary disease, unspecified: Secondary | ICD-10-CM | POA: Diagnosis not present

## 2014-09-25 DIAGNOSIS — I6322 Cerebral infarction due to unspecified occlusion or stenosis of basilar arteries: Secondary | ICD-10-CM

## 2014-09-25 DIAGNOSIS — I6523 Occlusion and stenosis of bilateral carotid arteries: Secondary | ICD-10-CM | POA: Insufficient documentation

## 2014-09-25 DIAGNOSIS — I639 Cerebral infarction, unspecified: Secondary | ICD-10-CM | POA: Diagnosis not present

## 2014-09-25 DIAGNOSIS — Z720 Tobacco use: Secondary | ICD-10-CM | POA: Diagnosis not present

## 2014-09-25 DIAGNOSIS — I10 Essential (primary) hypertension: Secondary | ICD-10-CM | POA: Insufficient documentation

## 2014-09-25 MED ORDER — IOHEXOL 350 MG/ML SOLN
60.0000 mL | Freq: Once | INTRAVENOUS | Status: AC | PRN
  Administered 2014-09-25: 75 mL via INTRAVENOUS

## 2014-09-26 ENCOUNTER — Encounter: Payer: Self-pay | Admitting: Obstetrics and Gynecology

## 2014-09-26 ENCOUNTER — Ambulatory Visit (INDEPENDENT_AMBULATORY_CARE_PROVIDER_SITE_OTHER): Payer: Medicare Other | Admitting: Obstetrics and Gynecology

## 2014-09-26 VITALS — BP 113/66 | HR 76 | Ht 64.0 in | Wt 150.1 lb

## 2014-09-26 DIAGNOSIS — N763 Subacute and chronic vulvitis: Secondary | ICD-10-CM

## 2014-09-26 DIAGNOSIS — J449 Chronic obstructive pulmonary disease, unspecified: Secondary | ICD-10-CM | POA: Diagnosis not present

## 2014-09-26 DIAGNOSIS — T7840XA Allergy, unspecified, initial encounter: Secondary | ICD-10-CM

## 2014-09-26 MED ORDER — PREDNISONE 10 MG PO TABS: 10.0000 mg | ORAL_TABLET | Freq: Every day | ORAL | Status: DC

## 2014-09-26 NOTE — Progress Notes (Signed)
GYN ENCOUNTER NOTE  Subjective:       [name] [name] is a 74 y.o. [phone] female is here for gynecologic evaluation of the following issues:  1. Chronic vulvitis. 2.  Sensitivity reaction  Patient presents today for follow-up on chronic vulvitis.  She is using Temovate ointment daily for control of symptoms. However, patient came in contact with chemicals or clamps in her yard during gardening and developed an intense allergic reaction involving her abdomen, groin and lower extremities.  She is using oatmeal soaks to help control symptoms.  She states that extremity and abdomen involvement has diminished.  There still is intense involvement of her perineum.   Gynecologic History No LMP recorded. Patient has had a hysterectomy.   Obstetric History OB History  Gravida Para Term Preterm AB SAB TAB Ectopic Multiple Living  '4 4 3 1      3    '$ # Outcome Date GA Lbr Len/2nd Weight Sex Delivery Anes PTL Lv  4 Term 1973   6 lb (2.722 kg) M Vag-Spont   Y  3 Term 1971   5 lb 4 oz (2.381 kg) M Vag-Spont   Y  2 Term 1965   5 lb 6 oz (2.438 kg) M Vag-Spont   Y  1 Preterm 1963   3 lb 5 oz (1.503 kg) F Vag-Spont  Y       Past Medical History  Diagnosis Date  . CAD (coronary artery disease)     Minimal nonobstructive, cath 05/2004  . Hypertension   . Diabetes mellitus   . Hyperlipemia     Followed by Dr. Rosanna Randy  . Stroke     6 yrs ago  . COPD (chronic obstructive pulmonary disease)   . Shortness of breath   . Pneumonia     hx  . Macular degeneration     rt  . COPD (chronic obstructive pulmonary disease)     Past Surgical History  Procedure Laterality Date  . Cardiac catheterization  05/2004  . Cholecystectomy    . Vesicovaginal fistula closure w/ tah    . Cataract extraction Left   . Back surgery  80's  . Anterior cervical decomp/discectomy fusion N/A 10/19/2013    Procedure: CERVICAL FIVE-SIX ANTERIOR CERVICAL DECOMPRESSION WITH FUSION INTERBODY PROSTHESIS PLATING AND PEEK CAGE;   Surgeon: Ophelia Charter, MD;  Location: [facility];  Service: Neurosurgery;  Laterality: N/A;  . Breast cyst excision Left     left negative   . Tonsillectomy and adenoidectomy    . Abdominal hysterectomy      Current Outpatient Prescriptions on File Prior to Visit  Medication Sig Dispense Refill  . albuterol (PROVENTIL HFA;VENTOLIN HFA) 108 (90 BASE) MCG/ACT inhaler Inhale 1-2 puffs into the lungs 2 (two) times daily.     Marland Kitchen amitriptyline (ELAVIL) 10 MG tablet Take 1 mg by mouth daily.    Marland Kitchen aspirin 81 MG tablet Take 81 mg by mouth daily.    Marland Kitchen atorvastatin (LIPITOR) 20 MG tablet Take 1 tablet by mouth at bedtime.    . beta carotene w/minerals (OCUVITE) tablet Take 1 tablet by mouth daily.    . budesonide-formoterol (SYMBICORT) 160-4.5 MCG/ACT inhaler Inhale 2 puffs into the lungs 2 (two) times daily. 1 Inhaler 0  . canagliflozin (INVOKANA) 300 MG TABS tablet Take 1 tablet by mouth daily.    . cholecalciferol (VITAMIN D) 1000 UNITS tablet Take 1,000 Units by mouth daily.    . diazepam (VALIUM) 5 MG tablet Take 1  tablet (5 mg total) by mouth every 6 (six) hours as needed for muscle spasms. 50 tablet 1  . docusate sodium 100 MG CAPS Take 100 mg by mouth 2 (two) times daily. 60 capsule 0  . gabapentin (NEURONTIN) 600 MG tablet Take 600 mg by mouth 2 (two) times daily.     Marland Kitchen glimepiride (AMARYL) 4 MG tablet Take 4 mg by mouth 2 (two) times daily.     Marland Kitchen glucose blood test strip as directed.    Marland Kitchen HYDROcodone-acetaminophen (NORCO/VICODIN) 5-325 MG per tablet Take 1 tablet by mouth every 6 (six) hours as needed.    . isosorbide mononitrate (IMDUR) 30 MG 24 hr tablet Take 1 tablet by mouth  daily 90 tablet 3  . LANCETS ULTRA THIN MISC as directed.    Marland Kitchen losartan-hydrochlorothiazide (HYZAAR) 100-25 MG per tablet Take 1 tablet by mouth daily.    . magnesium oxide (MAG-OX) 400 (241.3 MG) MG tablet Take 1 tablet by mouth 2 (two) times daily.    . metFORMIN (GLUCOPHAGE) 1000 MG tablet Take 1,000 mg  by mouth daily.     . Multiple Vitamins-Minerals (CENTRUM SILVER ADULT 50+ PO) Take 1 tablet by mouth daily.    . nitroGLYCERIN (NITROSTAT) 0.4 MG SL tablet Place 1 tablet (0.4 mg total) under the tongue every 5 (five) minutes as needed for chest pain. 25 tablet 3  . omeprazole (PRILOSEC) 20 MG capsule Take 20 mg by mouth daily.      Marland Kitchen oxyCODONE-acetaminophen (PERCOCET/ROXICET) 5-325 MG per tablet Take 1-2 tablets by mouth every 4 (four) hours as needed for moderate pain. 100 tablet 0  . Potassium Chloride (KLOR-CON 10 PO) Take 3 tablets by mouth daily.     . saxagliptin HCl (ONGLYZA) 5 MG TABS tablet Take 5 mg by mouth daily.      . Tiotropium Bromide Monohydrate (SPIRIVA RESPIMAT) 2.5 MCG/ACT AERS Inhale 2 puffs into the lungs daily. 12 g 3   No current facility-administered medications on file prior to visit.    Allergies  Allergen Reactions  . Coconut Fatty Acids Swelling    Throat swells    History   Social History  . Marital Status: Married    Spouse Name: N/A  . Number of Children: N/A  . Years of Education: N/A   Occupational History  . Retired, Environmental consultant    Social History Main Topics  . Smoking status: Current Every Day Smoker -- 0.50 packs/day for 54 years    Types: Cigarettes  . Smokeless tobacco: Never Used     Comment: smokes 3 cigs daily 09/07/14. Pt instructed to quit.  . Alcohol Use: No  . Drug Use: No  . Sexual Activity: No   Other Topics Concern  . Not on file   Social History Narrative   Married with 4 children   Gets regular exercise    Family History  Problem Relation Age of Onset  . Stroke Mother     Massive  . Heart attack Father     Massive  . Heart attack Brother   . Lung cancer Maternal Grandfather   . Heart attack Paternal Grandmother     MI  . Heart attack Brother     The following portions of the patient's history were reviewed and updated as appropriate: allergies, current medications, past family history, past medical  history, past social history, past surgical history and problem list.  Review of Systems  Review of Systems - General ROS: negative for - chills, fatigue,  fever, hot flashes, malaise or night sweats Hematological and Lymphatic ROS: negative for - bleeding problems or swollen lymph nodes Gastrointestinal ROS: negative for - abdominal pain, blood in stools, change in bowel habits and nausea/vomiting Musculoskeletal ROS: negative for - joint pain, muscle pain or muscular weakness Genito-Urinary ROS: Positive for vulvar rash  Objective:   BP 113/66 mmHg  Pulse 76  Ht '5\' 4"'$  (1.626 m)  Wt 150 lb 2 oz (68.096 kg)  BMI 25.76 kg/m2 CONSTITUTIONAL: Well-developed, well-nourished female in no acute distress.  HENT:  Normocephalic, atraumatic.  NECK: Normal range of motion, supple, no masses.  Normal thyroid.  SKIN: Skin is warm and dry. No rash noted. Not diaphoretic. No erythema. No pallor. [place]: Alert and oriented to person, place, and time. PSYCHIATRIC: Normal mood and affect. Normal behavior. Normal judgment and thought content. CARDIOVASCULAR:Not Examined RESPIRATORY: Not Examined BREASTS: Not Examined ABDOMEN: Soft, non distended; Non tender.  No Organomegaly. PELVIC:  External Genitalia: Intense hyperemia with vesicle formation of the labia majora extending to the ilioinguinal fold; several satellite lesion are noted over the gluteus.  BUS: Normal  Vagina: Normal  Bimanual exam: Not done  RV: Normal External exam  Bladder: Nontender MUSCULOSKELETAL: Normal range of motion. No tenderness.  No cyanosis, clubbing, or edema.     Assessment:   1.  Chronic vulvitis. 2.  Allergic reaction to possible chemical pregnancies gardening) or plant life contributing to a significant response   Plan:  1.  Prednisone Dosepak taper over 6 days. 2.  Continue with Temovate ointment daily to the perineum. 3.  Continue with oatmeal baths. 4.  Return in 1 week for follow-up

## 2014-09-27 ENCOUNTER — Encounter: Payer: Self-pay | Admitting: Family Medicine

## 2014-09-27 ENCOUNTER — Ambulatory Visit (INDEPENDENT_AMBULATORY_CARE_PROVIDER_SITE_OTHER): Payer: Medicare Other | Admitting: Family Medicine

## 2014-09-27 VITALS — BP 128/46 | HR 88 | Temp 98.1°F | Resp 16 | Ht 64.0 in | Wt 152.0 lb

## 2014-09-27 DIAGNOSIS — R252 Cramp and spasm: Secondary | ICD-10-CM

## 2014-09-27 DIAGNOSIS — E1141 Type 2 diabetes mellitus with diabetic mononeuropathy: Secondary | ICD-10-CM

## 2014-09-27 DIAGNOSIS — I10 Essential (primary) hypertension: Secondary | ICD-10-CM

## 2014-09-27 NOTE — Progress Notes (Signed)
Patient ID: [name] [name], female   DOB: 08-20-1940, 74 y.o.   MRN: [id number]   [name] [name]  MRN: [id number] DOB: 03-Oct-1940  Subjective:  HPI  1. Type 2 diabetes mellitus with diabetic mononeuropathy Patient was last seen on 09/06/24 for hospital follow up, no management changes were made at that time.  Her current medications include Invokana, Onglyza, Metformin and Glimepiride.   Her last A1C was done while in the hospital on 08/30/14 and it was 7.9, which is down from the one on 05/23/14 at 8.3.  Patient has had a 3 pound weight loss since her last visit.  Her home glucose levels have been 88-137.  She states she has started a Prednisone taper yesterday and anticipates the levels going up.  She has not had any hypoglycemic symptoms or events.     2. Essential hypertension Patient was last seen by [place] on 09/07/14 and her blood pressure at that time was 122/58.  She is currently taking Losartan HCT and reports good compliance and tolerance of the medication.  Patient checks her blood pressure at home and reports that she gets varying recordings.  Her pressure at home this morning was 151/71 but she states that it is usually runs much lower.  3. Cramp of both lower extremities Patient reports she is having leg cramps that she thinks is coming from her Atorvastatin.  She states she has been having these cramps for a long time.  She is on Magnesium and has tried an OTC leg cramp medication from [facility].  She states that they have helped some but now it has worsened and she would like to have these symptoms addressed.    Patient Active Problem List   Diagnosis Date Noted  . Chronic vulvitis 09/26/2014  . Allergic reaction 09/26/2014  . Carotid stenosis 08/30/2014  . Cervical nerve root disorder 08/10/2014  . Absolute anemia 08/10/2014  . CAD in native artery 08/10/2014  . B12 deficiency 08/10/2014  . Back ache 08/10/2014  . Bronchitis, chronic 08/10/2014  . Diabetes mellitus with polyneuropathy  08/10/2014  . Can't get food down 08/10/2014  . Eczema of external ear 08/10/2014  . Accumulation of fluid in tissues 08/10/2014  . Gout 08/10/2014  . Adult hypothyroidism 08/10/2014  . Mononeuritis 08/10/2014  . Muscle ache 08/10/2014  . Disorder of peripheral nervous system 08/10/2014  . Lesion of vulva 08/10/2014  . Cervical spondylosis with radiculopathy 10/19/2013  . Unstable angina 08/15/2013  . COPD exacerbation 03/29/2013  . CAD (coronary artery disease) 06/22/2011  . COPD (chronic obstructive pulmonary disease) with emphysema 03/07/2010  . CHEST PAIN UNSPECIFIED 07/22/2009  . HLD (hyperlipidemia) 04/01/2009  . Malaise and fatigue 04/01/2009  . HYPERLIPIDEMIA-MIXED 01/18/2009  . TOBACCO ABUSE 01/18/2009  . HYPERTENSION, BENIGN 01/18/2009  . CLAUDICATION 01/18/2009  . Pain in limb 01/18/2009  . CAFL (chronic airflow limitation) 01/20/2007  . Late effects of cerebrovascular disease 01/10/2007  . Essential (primary) hypertension 12/22/2006    Past Medical History  Diagnosis Date  . CAD (coronary artery disease)     Minimal nonobstructive, cath 05/2004  . Hypertension   . Diabetes mellitus   . Hyperlipemia     Followed by Dr. Rosanna Randy  . Stroke     6 yrs ago  . COPD (chronic obstructive pulmonary disease)   . Shortness of breath   . Pneumonia     hx  . Macular degeneration     rt  . COPD (chronic obstructive pulmonary disease)  History   Social History  . Marital Status: Married    Spouse Name: N/A  . Number of Children: N/A  . Years of Education: N/A   Occupational History  . Retired, Environmental consultant    Social History Main Topics  . Smoking status: Current Every Day Smoker -- 0.25 packs/day for 54 years    Types: Cigarettes  . Smokeless tobacco: Never Used     Comment: smokes 3 cigs daily 09/07/14. Pt instructed to quit.  . Alcohol Use: No  . Drug Use: No  . Sexual Activity: No   Other Topics Concern  . Not on file   Social History  Narrative   Married with 4 children   Gets regular exercise    Outpatient Prescriptions Prior to Visit  Medication Sig Dispense Refill  . albuterol (PROVENTIL HFA;VENTOLIN HFA) 108 (90 BASE) MCG/ACT inhaler Inhale 1-2 puffs into the lungs 2 (two) times daily.     Marland Kitchen amitriptyline (ELAVIL) 10 MG tablet Take 1 mg by mouth daily.    Marland Kitchen aspirin 81 MG tablet Take 81 mg by mouth daily.    Marland Kitchen atorvastatin (LIPITOR) 20 MG tablet Take 1 tablet by mouth at bedtime.    . beta carotene w/minerals (OCUVITE) tablet Take 1 tablet by mouth daily.    . budesonide-formoterol (SYMBICORT) 160-4.5 MCG/ACT inhaler Inhale 2 puffs into the lungs 2 (two) times daily. 1 Inhaler 0  . canagliflozin (INVOKANA) 300 MG TABS tablet Take 1 tablet by mouth daily.    . cholecalciferol (VITAMIN D) 1000 UNITS tablet Take 1,000 Units by mouth daily.    . diazepam (VALIUM) 5 MG tablet Take 1 tablet (5 mg total) by mouth every 6 (six) hours as needed for muscle spasms. 50 tablet 1  . docusate sodium 100 MG CAPS Take 100 mg by mouth 2 (two) times daily. 60 capsule 0  . gabapentin (NEURONTIN) 600 MG tablet Take 600 mg by mouth 2 (two) times daily.     Marland Kitchen glimepiride (AMARYL) 4 MG tablet Take 4 mg by mouth 2 (two) times daily.     Marland Kitchen glucose blood test strip as directed.    Marland Kitchen HYDROcodone-acetaminophen (NORCO/VICODIN) 5-325 MG per tablet Take 1 tablet by mouth every 6 (six) hours as needed.    . isosorbide mononitrate (IMDUR) 30 MG 24 hr tablet Take 1 tablet by mouth  daily 90 tablet 3  . LANCETS ULTRA THIN MISC as directed.    Marland Kitchen losartan-hydrochlorothiazide (HYZAAR) 100-25 MG per tablet Take 1 tablet by mouth daily.    . magnesium oxide (MAG-OX) 400 (241.3 MG) MG tablet Take 1 tablet by mouth 2 (two) times daily.    . metFORMIN (GLUCOPHAGE) 1000 MG tablet Take 1,000 mg by mouth daily.     . Multiple Vitamins-Minerals (CENTRUM SILVER ADULT 50+ PO) Take 1 tablet by mouth daily.    . nitroGLYCERIN (NITROSTAT) 0.4 MG SL tablet Place 1  tablet (0.4 mg total) under the tongue every 5 (five) minutes as needed for chest pain. 25 tablet 3  . omeprazole (PRILOSEC) 20 MG capsule Take 20 mg by mouth daily.      Marland Kitchen oxyCODONE-acetaminophen (PERCOCET/ROXICET) 5-325 MG per tablet Take 1-2 tablets by mouth every 4 (four) hours as needed for moderate pain. 100 tablet 0  . Potassium Chloride (KLOR-CON 10 PO) Take 3 tablets by mouth daily.     . predniSONE (DELTASONE) 10 MG tablet Take 1 tablet (10 mg total) by mouth daily with breakfast. Day 1 - 6 tabs,  Day 2- 5 tabs, Day 3- 4 tabs, Day 4- 3 tabs, Day 5- 2 tabs, Day 6- 1 tab 21 tablet 0  . saxagliptin HCl (ONGLYZA) 5 MG TABS tablet Take 5 mg by mouth daily.      . Tiotropium Bromide Monohydrate (SPIRIVA RESPIMAT) 2.5 MCG/ACT AERS Inhale 2 puffs into the lungs daily. 12 g 3   No facility-administered medications prior to visit.    Allergies  Allergen Reactions  . Coconut Fatty Acids Swelling    Throat swells    Review of Systems  Constitutional: Positive for weight loss (deliberate).  Eyes: Negative.   Respiratory: Positive for shortness of breath (chronic and unchanged).   Cardiovascular: Negative.   Gastrointestinal: Negative.   Musculoskeletal: Negative.        Leg pain and cramping that she is associating with her Atorvastatin.    Neurological: Negative for headaches.  Endo/Heme/Allergies: Negative.   Psychiatric/Behavioral: Negative.    Objective:  BP 128/46 mmHg  Pulse 88  Temp(Src) 98.1 F (36.7 C) (Oral)  Resp 16  Ht '5\' 4"'$  (1.626 m)  Wt 152 lb (68.947 kg)  BMI 26.08 kg/m2  Physical Exam  Constitutional: She is oriented to person, place, and time and well-developed, well-nourished, and in no distress.  HENT:  Head: Normocephalic and atraumatic.  Right Ear: External ear normal.  Left Ear: External ear normal.  Nose: Nose normal.  Mouth/Throat: Oropharynx is clear and moist.  Eyes: Conjunctivae are normal. Pupils are equal, round, and reactive to light.  Neck:  Normal range of motion. Neck supple.  Cardiovascular: Normal rate, regular rhythm, normal heart sounds and intact distal pulses.   Pulmonary/Chest: Effort normal.  Abdominal: Soft. Bowel sounds are normal.  Neurological: She is alert and oriented to person, place, and time.  Skin: Skin is warm and dry.  Psychiatric: Mood, memory, affect and judgment normal.    Assessment and Plan :  Type 2 diabetes mellitus with diabetic mononeuropathy  Essential hypertension   Tobacco abuse. Patient states she is down to 3 cigarettes a day. I have stressed to her the need to completely stop.  Cerebrovascular disease  Miguel Aschoff MD [facility] 09/27/2014 11:14 AM

## 2014-09-28 ENCOUNTER — Encounter: Payer: Self-pay | Admitting: Family Medicine

## 2014-09-28 DIAGNOSIS — I639 Cerebral infarction, unspecified: Secondary | ICD-10-CM | POA: Diagnosis not present

## 2014-09-28 DIAGNOSIS — F17200 Nicotine dependence, unspecified, uncomplicated: Secondary | ICD-10-CM | POA: Diagnosis not present

## 2014-09-28 DIAGNOSIS — E785 Hyperlipidemia, unspecified: Secondary | ICD-10-CM | POA: Diagnosis not present

## 2014-09-28 DIAGNOSIS — I6529 Occlusion and stenosis of unspecified carotid artery: Secondary | ICD-10-CM | POA: Diagnosis not present

## 2014-09-28 DIAGNOSIS — I10 Essential (primary) hypertension: Secondary | ICD-10-CM | POA: Diagnosis not present

## 2014-10-02 ENCOUNTER — Telehealth: Payer: Self-pay

## 2014-10-02 ENCOUNTER — Ambulatory Visit: Payer: Medicare Other | Admitting: Obstetrics and Gynecology

## 2014-10-02 NOTE — Telephone Encounter (Signed)
Received cardiac clearance request from [facility] for pt to proceed w/ Lt CEA, not yet scheduled. Per Christell Faith, PA, pt is cleared to proceed.  Faxed to [phone].

## 2014-10-11 ENCOUNTER — Ambulatory Visit (INDEPENDENT_AMBULATORY_CARE_PROVIDER_SITE_OTHER): Payer: Medicare Other | Admitting: Physician Assistant

## 2014-10-11 ENCOUNTER — Encounter: Payer: Self-pay | Admitting: Physician Assistant

## 2014-10-11 VITALS — BP 124/56 | HR 97 | Temp 98.4°F | Resp 16 | Wt 152.0 lb

## 2014-10-11 DIAGNOSIS — J411 Mucopurulent chronic bronchitis: Secondary | ICD-10-CM

## 2014-10-11 DIAGNOSIS — J441 Chronic obstructive pulmonary disease with (acute) exacerbation: Secondary | ICD-10-CM

## 2014-10-11 DIAGNOSIS — J438 Other emphysema: Secondary | ICD-10-CM

## 2014-10-11 MED ORDER — LEVOFLOXACIN 750 MG PO TABS: 750.0000 mg | ORAL_TABLET | Freq: Every day | ORAL | Status: DC

## 2014-10-11 NOTE — Progress Notes (Signed)
   Subjective:    Patient ID: [name] [name], female    DOB: [birth year], 74 y.o.   MRN: [id number]  Cough This is a chronic problem. The current episode started yesterday. The problem has been gradually worsening. The problem occurs every few minutes. The cough is productive of purulent sputum. Associated symptoms include nasal congestion, postnasal drip, rhinorrhea, shortness of breath and wheezing. Pertinent negatives include no chest pain, chills, ear congestion, ear pain, fever, headaches, heartburn, hemoptysis, myalgias, rash, sore throat, sweats or weight loss. The symptoms are aggravated by lying down. Risk factors for lung disease include smoking/tobacco exposure. She has tried a beta-agonist inhaler, ipratropium inhaler, oral steroids, OTC cough suppressant, rest and steroid inhaler for the symptoms. The treatment provided moderate relief. Her past medical history is significant for bronchitis, COPD, emphysema and pneumonia. There is no history of asthma, bronchiectasis or environmental allergies.      Review of Systems  Constitutional: Negative for fever, chills and weight loss.  HENT: Positive for postnasal drip and rhinorrhea. Negative for ear pain, sore throat, trouble swallowing and voice change.   Respiratory: Positive for cough, shortness of breath and wheezing. Negative for hemoptysis and chest tightness.   Cardiovascular: Negative for chest pain.  Gastrointestinal: Negative.  Negative for heartburn.  Musculoskeletal: Negative for myalgias.  Skin: Negative for rash.  Allergic/Immunologic: Negative for environmental allergies.  Neurological: Negative for dizziness and headaches.       Objective:   Physical Exam  Constitutional: She appears well-developed and well-nourished. No distress.  HENT:  Head: Normocephalic and atraumatic.  Right Ear: Hearing, tympanic membrane, external ear and ear canal normal.  Left Ear: Hearing, tympanic membrane, external ear and ear canal  normal.  Nose: Nose normal. Right sinus exhibits no maxillary sinus tenderness and no frontal sinus tenderness. Left sinus exhibits no maxillary sinus tenderness and no frontal sinus tenderness.  Mouth/Throat: Uvula is midline, oropharynx is clear and moist and mucous membranes are normal. No oropharyngeal exudate.  Eyes: Pupils are equal, round, and reactive to light. Right eye exhibits no discharge. Left eye exhibits no discharge.  Neck: Normal range of motion. Neck supple. No JVD present. No tracheal deviation present. No thyromegaly present.  Cardiovascular: Normal rate, regular rhythm and normal heart sounds.  Exam reveals no gallop and no friction rub.   No murmur heard. Pulmonary/Chest: Effort normal. No accessory muscle usage or stridor. No respiratory distress. She has decreased breath sounds (throughout). She has no wheezes. She has no rales. She exhibits no tenderness.  Lymphadenopathy:    She has no cervical adenopathy.  Skin: She is not diaphoretic.  Vitals reviewed.         Assessment & Plan:  1. COPD exacerbation Will treat with levaquin as she has tolerated this before and she has a L CEA surgical procedure planned on 10/24/14.  If no improvement in one week she is to call the office.  Will obtain CXR if no improvement. - levofloxacin (LEVAQUIN) 750 MG tablet; Take 1 tablet (750 mg total) by mouth daily.  Dispense: 10 tablet; Refill: 0  2. Other emphysema Chronic.  O2 sats are stable at 93%.  Uses O2 via nasal cannula at 3L nightly.  3. Mucopurulent chronic bronchitis See above for COPD exacerbation.

## 2014-10-11 NOTE — Patient Instructions (Signed)

## 2014-10-17 ENCOUNTER — Encounter
Admission: RE | Admit: 2014-10-17 | Discharge: 2014-10-17 | Disposition: A | Discharge status: Home / Self Care | Payer: Medicare Other | Source: Ambulatory Visit | Attending: Vascular Surgery | Admitting: Vascular Surgery

## 2014-10-17 DIAGNOSIS — I6522 Occlusion and stenosis of left carotid artery: Secondary | ICD-10-CM | POA: Insufficient documentation

## 2014-10-17 DIAGNOSIS — Z01812 Encounter for preprocedural laboratory examination: Secondary | ICD-10-CM | POA: Diagnosis not present

## 2014-10-17 LAB — CBC
HCT: 39.8 % (ref 35.0–47.0)
Hemoglobin: 13.5 g/dL (ref 12.0–16.0)
MCH: 31.1 pg (ref 26.0–34.0)
MCHC: 33.9 g/dL (ref 32.0–36.0)
MCV: 91.7 fL (ref 80.0–100.0)
PLATELETS: 253 10*3/uL (ref 150–440)
RBC: 4.34 MIL/uL (ref 3.80–5.20)
RDW: 14.3 % (ref 11.5–14.5)
WBC: 9.8 10*3/uL (ref 3.6–11.0)

## 2014-10-17 LAB — TYPE AND SCREEN
ABO/RH(D): O POS
Antibody Screen: NEGATIVE

## 2014-10-17 LAB — BASIC METABOLIC PANEL
Anion gap: 9 (ref 5–15)
BUN: 21 mg/dL — ABNORMAL HIGH (ref 6–20)
CALCIUM: 8.8 mg/dL — AB (ref 8.9–10.3)
CO2: 30 mmol/L (ref 22–32)
Chloride: 99 mmol/L — ABNORMAL LOW (ref 101–111)
Creatinine, Ser: 1.09 mg/dL — ABNORMAL HIGH (ref 0.44–1.00)
GFR calc Af Amer: 57 mL/min — ABNORMAL LOW (ref >60)
GFR, EST NON AFRICAN AMERICAN: 49 mL/min — AB (ref >60)
Glucose, Bld: 136 mg/dL — ABNORMAL HIGH (ref 65–99)
Potassium: 4 mmol/L (ref 3.5–5.1)
Sodium: 138 mmol/L (ref 135–145)

## 2014-10-17 LAB — APTT: APTT: 26 s (ref 24–36)

## 2014-10-17 LAB — ABO/RH: ABO/RH(D): O POS

## 2014-10-17 LAB — PROTIME-INR
INR: 0.95
Prothrombin Time: 12.9 seconds (ref 11.4–15.0)

## 2014-10-17 NOTE — Patient Instructions (Signed)
  Your procedure is scheduled on: Wednesday October 24, 2014. Report to Same Day Surgery. To find out your arrival time please call [phone] between 1PM - 3PM on Tuesday October 23, 2014.  Remember: Instructions that are not followed completely may result in serious medical risk, up to and including death, or upon the discretion of your surgeon and anesthesiologist your surgery may need to be rescheduled.    __x__ 1. Do not eat food or drink liquids after midnight. No gum chewing or hard candies.     ____ 2. No Alcohol for 24 hours before or after surgery.   ____ 3. Bring all medications with you on the day of surgery if instructed.    __x__ 4. Notify your doctor if there is any change in your medical condition     (cold, fever, infections).     Do not wear jewelry, make-up, hairpins, clips or nail polish.  Do not wear lotions, powders, or perfumes. You may wear deodorant.  Do not shave 48 hours prior to surgery. Men may shave face and neck.  Do not bring valuables to the hospital.    [facility] is not responsible for any belongings or valuables.               Contacts, dentures or bridgework may not be worn into surgery.  Leave your suitcase in the car. After surgery it may be brought to your room.  For patients admitted to the hospital, discharge time is determined by your treatment team.   Patients discharged the day of surgery will not be allowed to drive home.    Please read over the following fact sheets that you were given:   [facility] Preparing for Surgery  __x__ Take these medicines the morning of surgery with A SIP OF WATER:    1. budesonide-formoterol (SYMBICORT)  2. albuterol (PROVENTIL HFA;VENTOLIN HFA)   3. gabapentin (NEURONTIN)  4. isosorbide mononitrate (IMDUR)  5.magnesium oxide (MAG-OX)  6.omeprazole (PRILOSEC)  7.Tiotropium Bromide Monohydrate (SPIRIVA RESPIMAT)   ____ Fleet Enema (as directed)   _x___ Use CHG Soap as directed  __x__ Use inhalers on  the day of surgery and bring to hospital.   __x_ Stop metformin 2 days prior to surgery October 22, 2014.  ____ Take 1/2 of usual insulin dose the night before surgery and none on the morning of surgery.   __x__ Do NOT stop aspirin per Dr. Lucky Cowboy.  __x__ Stop Anti-inflammatories on Aleve today.  Tylenol is ok to take for pain.   ____ Stop supplements until after surgery.    ____ Bring C-Pap to the hospital.

## 2014-10-24 ENCOUNTER — Inpatient Hospital Stay: Payer: Medicare Other | Admitting: Anesthesiology

## 2014-10-24 ENCOUNTER — Encounter: Payer: Self-pay | Admitting: Anesthesiology

## 2014-10-24 ENCOUNTER — Encounter
Admission: RE | Disposition: A | Discharge status: Home / Self Care | Payer: Self-pay | Source: Ambulatory Visit | Attending: Vascular Surgery

## 2014-10-24 ENCOUNTER — Inpatient Hospital Stay
Admission: RE | Admit: 2014-10-24 | Discharge: 2014-10-25 | DRG: 039 | Disposition: A | Discharge status: Home / Self Care | Payer: Medicare Other | Source: Ambulatory Visit | Attending: Vascular Surgery | Admitting: Vascular Surgery

## 2014-10-24 DIAGNOSIS — I2510 Atherosclerotic heart disease of native coronary artery without angina pectoris: Secondary | ICD-10-CM | POA: Diagnosis present

## 2014-10-24 DIAGNOSIS — J449 Chronic obstructive pulmonary disease, unspecified: Secondary | ICD-10-CM | POA: Diagnosis present

## 2014-10-24 DIAGNOSIS — Z794 Long term (current) use of insulin: Secondary | ICD-10-CM

## 2014-10-24 DIAGNOSIS — I10 Essential (primary) hypertension: Secondary | ICD-10-CM | POA: Diagnosis present

## 2014-10-24 DIAGNOSIS — Z79899 Other long term (current) drug therapy: Secondary | ICD-10-CM | POA: Diagnosis not present

## 2014-10-24 DIAGNOSIS — Z7982 Long term (current) use of aspirin: Secondary | ICD-10-CM

## 2014-10-24 DIAGNOSIS — Z7951 Long term (current) use of inhaled steroids: Secondary | ICD-10-CM

## 2014-10-24 DIAGNOSIS — I739 Peripheral vascular disease, unspecified: Secondary | ICD-10-CM | POA: Diagnosis present

## 2014-10-24 DIAGNOSIS — I6522 Occlusion and stenosis of left carotid artery: Secondary | ICD-10-CM | POA: Diagnosis not present

## 2014-10-24 DIAGNOSIS — E119 Type 2 diabetes mellitus without complications: Secondary | ICD-10-CM | POA: Diagnosis present

## 2014-10-24 DIAGNOSIS — H3530 Unspecified macular degeneration: Secondary | ICD-10-CM | POA: Diagnosis present

## 2014-10-24 DIAGNOSIS — E785 Hyperlipidemia, unspecified: Secondary | ICD-10-CM | POA: Diagnosis present

## 2014-10-24 DIAGNOSIS — Z8673 Personal history of transient ischemic attack (TIA), and cerebral infarction without residual deficits: Secondary | ICD-10-CM | POA: Diagnosis not present

## 2014-10-24 DIAGNOSIS — I6529 Occlusion and stenosis of unspecified carotid artery: Secondary | ICD-10-CM | POA: Diagnosis present

## 2014-10-24 HISTORY — PX: ENDARTERECTOMY: SHX5162

## 2014-10-24 LAB — GLUCOSE, CAPILLARY
GLUCOSE-CAPILLARY: 198 mg/dL — AB (ref 65–99)
Glucose-Capillary: 224 mg/dL — ABNORMAL HIGH (ref 65–99)

## 2014-10-24 LAB — MRSA PCR SCREENING: MRSA BY PCR: NEGATIVE

## 2014-10-24 SURGERY — ENDARTERECTOMY, CAROTID
Anesthesia: General | Laterality: Left | Wound class: Clean

## 2014-10-24 MED ORDER — LIDOCAINE HCL (CARDIAC) 20 MG/ML IV SOLN
INTRAVENOUS | Status: DC | PRN
  Administered 2014-10-24: 50 mg via INTRAVENOUS

## 2014-10-24 MED ORDER — CLOPIDOGREL BISULFATE 75 MG PO TABS
75.0000 mg | ORAL_TABLET | Freq: Every day | ORAL | Status: DC
  Administered 2014-10-25: 75 mg via ORAL
  Filled 2014-10-24: qty 1

## 2014-10-24 MED ORDER — DEXTROSE 5 % IV SOLN
20.0000 mg | INTRAVENOUS | Status: DC | PRN
  Administered 2014-10-24: 50 ug/min via INTRAVENOUS

## 2014-10-24 MED ORDER — PROPOFOL 10 MG/ML IV BOLUS
INTRAVENOUS | Status: DC | PRN
  Administered 2014-10-24: 110 mg via INTRAVENOUS

## 2014-10-24 MED ORDER — ONDANSETRON HCL 4 MG/2ML IJ SOLN
INTRAMUSCULAR | Status: DC | PRN
  Administered 2014-10-24: 4 mg via INTRAVENOUS

## 2014-10-24 MED ORDER — PHENYLEPHRINE HCL 10 MG/ML IJ SOLN
INTRAMUSCULAR | Status: DC | PRN
  Administered 2014-10-24 (×2): 100 ug via INTRAVENOUS

## 2014-10-24 MED ORDER — ALUM & MAG HYDROXIDE-SIMETH 200-200-20 MG/5ML PO SUSP: 15.0000 mL | ORAL | Status: DC | PRN

## 2014-10-24 MED ORDER — SODIUM CHLORIDE 0.9 % IV SOLN
INTRAVENOUS | Status: DC | PRN
  Administered 2014-10-24 (×2): via INTRAVENOUS

## 2014-10-24 MED ORDER — NITROGLYCERIN IN D5W 200-5 MCG/ML-% IV SOLN
INTRAVENOUS | Status: AC
  Filled 2014-10-24: qty 250

## 2014-10-24 MED ORDER — METOPROLOL TARTRATE 1 MG/ML IV SOLN: 2.0000 mg | INTRAVENOUS | Status: DC | PRN

## 2014-10-24 MED ORDER — PHENOL 1.4 % MT LIQD
1.0000 | OROMUCOSAL | Status: DC | PRN
  Administered 2014-10-24: 1 via OROMUCOSAL
  Filled 2014-10-24 (×2): qty 177

## 2014-10-24 MED ORDER — HEPARIN SODIUM (PORCINE) 1000 UNIT/ML IJ SOLN
INTRAMUSCULAR | Status: DC | PRN
  Administered 2014-10-24: 5000 [IU] via INTRAVENOUS

## 2014-10-24 MED ORDER — ACETAMINOPHEN 325 MG PO TABS: 325.0000 mg | ORAL_TABLET | ORAL | Status: DC | PRN

## 2014-10-24 MED ORDER — CEFAZOLIN SODIUM 1 G IJ SOLR
INTRAMUSCULAR | Status: AC
  Filled 2014-10-24: qty 10

## 2014-10-24 MED ORDER — SODIUM CHLORIDE 0.9 % IV SOLN
INTRAVENOUS | Status: DC | PRN
  Administered 2014-10-24: 60 mL via INTRAMUSCULAR

## 2014-10-24 MED ORDER — CEFAZOLIN SODIUM 1-5 GM-% IV SOLN
INTRAVENOUS | Status: AC
  Administered 2014-10-24: 1 g via INTRAVENOUS
  Filled 2014-10-24: qty 50

## 2014-10-24 MED ORDER — CEFUROXIME SODIUM 1.5 G IJ SOLR
1.5000 g | Freq: Two times a day (BID) | INTRAMUSCULAR | Status: AC
  Administered 2014-10-24 – 2014-10-25 (×2): 1.5 g via INTRAVENOUS
  Filled 2014-10-24 (×2): qty 1.5

## 2014-10-24 MED ORDER — SODIUM CHLORIDE 0.9 % IV SOLN: 500.0000 mL | Freq: Once | INTRAVENOUS | Status: AC | PRN

## 2014-10-24 MED ORDER — MIDAZOLAM HCL 2 MG/2ML IJ SOLN
INTRAMUSCULAR | Status: DC | PRN
  Administered 2014-10-24: 1 mg via INTRAVENOUS

## 2014-10-24 MED ORDER — NITROGLYCERIN IN D5W 200-5 MCG/ML-% IV SOLN: 5.0000 ug/min | INTRAVENOUS | Status: DC

## 2014-10-24 MED ORDER — SODIUM CHLORIDE 0.9 % IV SOLN
INTRAVENOUS | Status: DC | PRN
  Administered 2014-10-24: 07:00:00 via INTRAVENOUS

## 2014-10-24 MED ORDER — ONDANSETRON HCL 4 MG/2ML IJ SOLN: 4.0000 mg | Freq: Once | INTRAMUSCULAR | Status: DC | PRN

## 2014-10-24 MED ORDER — NEOSTIGMINE METHYLSULFATE 10 MG/10ML IV SOLN
INTRAVENOUS | Status: DC | PRN
  Administered 2014-10-24: 5 mg via INTRAVENOUS

## 2014-10-24 MED ORDER — ROCURONIUM BROMIDE 100 MG/10ML IV SOLN
INTRAVENOUS | Status: DC | PRN
  Administered 2014-10-24 (×3): 10 mg via INTRAVENOUS
  Administered 2014-10-24: 5 mg via INTRAVENOUS
  Administered 2014-10-24: 10 mg via INTRAVENOUS
  Administered 2014-10-24: 30 mg via INTRAVENOUS

## 2014-10-24 MED ORDER — ACETAMINOPHEN 325 MG RE SUPP: 325.0000 mg | RECTAL | Status: DC | PRN

## 2014-10-24 MED ORDER — DOPAMINE-DEXTROSE 3.2-5 MG/ML-% IV SOLN: 3.0000 ug/kg/min | INTRAVENOUS | Status: DC

## 2014-10-24 MED ORDER — MORPHINE SULFATE 2 MG/ML IJ SOLN
2.0000 mg | INTRAMUSCULAR | Status: DC | PRN
  Administered 2014-10-24 (×2): 4 mg via INTRAVENOUS
  Administered 2014-10-24 (×2): 2 mg via INTRAVENOUS
  Filled 2014-10-24: qty 1
  Filled 2014-10-24: qty 2
  Filled 2014-10-24: qty 1
  Filled 2014-10-24: qty 2

## 2014-10-24 MED ORDER — LIDOCAINE HCL 1 % IJ SOLN
INTRAMUSCULAR | Status: DC | PRN
  Administered 2014-10-24: 10 mL

## 2014-10-24 MED ORDER — LIDOCAINE HCL (PF) 1 % IJ SOLN
INTRAMUSCULAR | Status: AC
  Filled 2014-10-24: qty 30

## 2014-10-24 MED ORDER — OXYCODONE-ACETAMINOPHEN 5-325 MG PO TABS: 1.0000 | ORAL_TABLET | ORAL | Status: DC | PRN

## 2014-10-24 MED ORDER — GUAIFENESIN-DM 100-10 MG/5ML PO SYRP: 15.0000 mL | ORAL_SOLUTION | ORAL | Status: DC | PRN

## 2014-10-24 MED ORDER — GLYCOPYRROLATE 0.2 MG/ML IJ SOLN
INTRAMUSCULAR | Status: DC | PRN
  Administered 2014-10-24: 0.2 mg via INTRAVENOUS
  Administered 2014-10-24: .8 mg via INTRAVENOUS

## 2014-10-24 MED ORDER — FENTANYL CITRATE (PF) 100 MCG/2ML IJ SOLN
INTRAMUSCULAR | Status: DC | PRN
  Administered 2014-10-24 (×3): 50 ug via INTRAVENOUS

## 2014-10-24 MED ORDER — POTASSIUM CHLORIDE CRYS ER 20 MEQ PO TBCR: 20.0000 meq | EXTENDED_RELEASE_TABLET | Freq: Every day | ORAL | Status: DC | PRN

## 2014-10-24 MED ORDER — DOCUSATE SODIUM 100 MG PO CAPS
100.0000 mg | ORAL_CAPSULE | Freq: Every day | ORAL | Status: DC
  Administered 2014-10-25: 100 mg via ORAL
  Filled 2014-10-24: qty 1

## 2014-10-24 MED ORDER — ONDANSETRON HCL 4 MG/2ML IJ SOLN: 4.0000 mg | Freq: Four times a day (QID) | INTRAMUSCULAR | Status: DC | PRN

## 2014-10-24 MED ORDER — HEPARIN SODIUM (PORCINE) 1000 UNIT/ML IJ SOLN
INTRAMUSCULAR | Status: AC
  Filled 2014-10-24: qty 1

## 2014-10-24 MED ORDER — EVICEL 2 ML EX KIT
PACK | CUTANEOUS | Status: DC | PRN
  Administered 2014-10-24: 2 mL

## 2014-10-24 MED ORDER — ESMOLOL HCL-SODIUM CHLORIDE 2000 MG/100ML IV SOLN: 25.0000 ug/kg/min | INTRAVENOUS | Status: DC

## 2014-10-24 MED ORDER — MAGNESIUM SULFATE 2 GM/50ML IV SOLN: 2.0000 g | Freq: Every day | INTRAVENOUS | Status: DC | PRN

## 2014-10-24 MED ORDER — FENTANYL CITRATE (PF) 100 MCG/2ML IJ SOLN: 25.0000 ug | INTRAMUSCULAR | Status: DC | PRN

## 2014-10-24 MED ORDER — ASPIRIN EC 81 MG PO TBEC
81.0000 mg | DELAYED_RELEASE_TABLET | Freq: Every day | ORAL | Status: DC
  Administered 2014-10-25: 81 mg via ORAL
  Filled 2014-10-24: qty 1

## 2014-10-24 MED ORDER — SODIUM CHLORIDE 0.9 % IV SOLN
INTRAVENOUS | Status: DC
  Administered 2014-10-24: 13:00:00 via INTRAVENOUS

## 2014-10-24 MED ORDER — FAMOTIDINE IN NACL 20-0.9 MG/50ML-% IV SOLN
20.0000 mg | Freq: Two times a day (BID) | INTRAVENOUS | Status: DC
  Administered 2014-10-24 – 2014-10-25 (×3): 20 mg via INTRAVENOUS
  Filled 2014-10-24 (×5): qty 50

## 2014-10-24 SURGICAL SUPPLY — 57 items
BAG DECANTER STRL (MISCELLANEOUS) ×2 IMPLANT
BLADE SURG 15 STRL LF DISP TIS (BLADE) ×1 IMPLANT
BLADE SURG 15 STRL SS (BLADE) ×1
BLADE SURG SZ11 CARB STEEL (BLADE) ×2 IMPLANT
BOOT SUTURE AID YELLOW STND (SUTURE) ×2 IMPLANT
BRUSH SCRUB 4% CHG (MISCELLANEOUS) ×2 IMPLANT
CANISTER SUCT 1200ML W/VALVE (MISCELLANEOUS) ×2 IMPLANT
CATH TRAY 16F METER LATEX (MISCELLANEOUS) ×2 IMPLANT
DRAPE INCISE IOBAN 66X45 STRL (DRAPES) ×2 IMPLANT
DRAPE PED LAPAROTOMY (DRAPES) ×2 IMPLANT
DRAPE SHEET LG 3/4 BI-LAMINATE (DRAPES) ×2 IMPLANT
DRSG TEGADERM 4X4.75 (GAUZE/BANDAGES/DRESSINGS) IMPLANT
DRSG TELFA 3X8 NADH (GAUZE/BANDAGES/DRESSINGS) IMPLANT
DURAPREP 26ML APPLICATOR (WOUND CARE) ×2 IMPLANT
ELECT CAUTERY BLADE 6.4 (BLADE) ×2 IMPLANT
EVICEL 2ML SEALANT HUMAN (Miscellaneous) ×2 IMPLANT
GLOVE BIO SURGEON STRL SZ7 (GLOVE) ×10 IMPLANT
GOWN STRL REUS W/ TWL LRG LVL3 (GOWN DISPOSABLE) ×1 IMPLANT
GOWN STRL REUS W/ TWL XL LVL3 (GOWN DISPOSABLE) ×2 IMPLANT
GOWN STRL REUS W/TWL LRG LVL3 (GOWN DISPOSABLE) ×1
GOWN STRL REUS W/TWL XL LVL3 (GOWN DISPOSABLE) ×2
HEMOSTAT SURGICEL 2X3 (HEMOSTASIS) ×2 IMPLANT
IV NS 250ML (IV SOLUTION) ×1
IV NS 250ML BAXH (IV SOLUTION) ×1 IMPLANT
KIT RM TURNOVER STRD PROC AR (KITS) ×2 IMPLANT
LABEL OR SOLS (LABEL) ×2 IMPLANT
LIQUID BAND (GAUZE/BANDAGES/DRESSINGS) ×2 IMPLANT
LOOP RED MAXI  1X406MM (MISCELLANEOUS) ×2
LOOP VESSEL MAXI 1X406 RED (MISCELLANEOUS) ×2 IMPLANT
LOOP VESSEL MINI 0.8X406 BLUE (MISCELLANEOUS) ×1 IMPLANT
LOOPS BLUE MINI 0.8X406MM (MISCELLANEOUS) ×1
NDL SAFETY 25GX1.5 (NEEDLE) ×2 IMPLANT
NEEDLE FILTER BLUNT 18X 1/2SAF (NEEDLE) ×1
NEEDLE FILTER BLUNT 18X1 1/2 (NEEDLE) ×1 IMPLANT
NS IRRIG 1000ML POUR BTL (IV SOLUTION) ×2 IMPLANT
PACK BASIN MAJOR ARMC (MISCELLANEOUS) ×2 IMPLANT
PAD GROUND ADULT SPLIT (MISCELLANEOUS) ×2 IMPLANT
PATCH CAROTID ECM VASC 1X10 (Prosthesis & Implant Heart) ×2 IMPLANT
PENCIL ELECTRO HAND CTR (MISCELLANEOUS) IMPLANT
SHUNT CAROTID PRUITT F3 T3103A (SHUNT) ×2 IMPLANT
SUT MNCRL 4-0 (SUTURE) ×1
SUT MNCRL 4-0 27XMFL (SUTURE) ×1
SUT PROLENE 6 0 BV (SUTURE) ×14 IMPLANT
SUT PROLENE 7 0 BV 1 (SUTURE) ×6 IMPLANT
SUT SILK 2 0 (SUTURE) ×1
SUT SILK 2-0 18XBRD TIE 12 (SUTURE) ×1 IMPLANT
SUT SILK 3 0 (SUTURE) ×1
SUT SILK 3-0 18XBRD TIE 12 (SUTURE) ×1 IMPLANT
SUT SILK 4 0 (SUTURE) ×1
SUT SILK 4-0 18XBRD TIE 12 (SUTURE) ×1 IMPLANT
SUT VIC AB 3-0 SH 27 (SUTURE) ×2
SUT VIC AB 3-0 SH 27X BRD (SUTURE) ×2 IMPLANT
SUTURE MNCRL 4-0 27XMF (SUTURE) ×1 IMPLANT
SYR 20CC LL (SYRINGE) ×2 IMPLANT
SYRINGE 10CC LL (SYRINGE) ×4 IMPLANT
TOWEL OR 17X26 4PK STRL BLUE (TOWEL DISPOSABLE) IMPLANT
TUBING CONNECTING 10 (TUBING) IMPLANT

## 2014-10-24 NOTE — Op Note (Signed)
[facility]   OPERATIVE NOTE  PROCEDURE:   1.  left carotid endarterectomy with CorMatrix arterial patch reconstruction  PRE-OPERATIVE DIAGNOSIS: 1.  left carotid stenosis, high grade   POST-OPERATIVE DIAGNOSIS: same as above   SURGEON: Leotis Pain, MD  ASSISTANT(S): Dr. Hortencia Pilar, MD  ANESTHESIA: general  ESTIMATED BLOOD LOSS: 100 cc  FINDING(S): 1.  left carotid plaque.  SPECIMEN(S):  Carotid plaque (sent to Pathology)  INDICATIONS:   [name] [name] is a 74 y.o. female who presents with left carotid stenosis of >75%.  I discussed with the patient the risks, benefits, and alternatives to carotid endarterectomy.  I discussed the differences between carotid stenting and carotid endarterectomy. I discussed the procedural details of carotid endarterectomy with the patient.  The patient is aware that the risks of carotid endarterectomy include but are not limited to: bleeding, infection, stroke, myocardial infarction, death, cranial nerve injuries both temporary and permanent, neck hematoma, possible airway compromise, labile blood pressure post-operatively, cerebral hyperperfusion syndrome, and possible need for additional interventions in the future. The patient is aware of the risks and agrees to proceed forward with the procedure.  DESCRIPTION: After full informed written consent was obtained from the patient, the patient was brought back to the operating room and placed supine upon the operating table.  Prior to induction, the patient received IV antibiotics.  After obtaining adequate anesthesia, the patient was placed into a modified beach chair position with a shoulder roll in place and the patient's neck slightly hyperextended and rotated away from the surgical site.  The patient was prepped in the standard fashion for a carotid endarterectomy.  I made an incision anterior to the sternocleidomastoid muscle and dissected down through the subcutaneous  tissue.  The platysmas was opened with electrocautery.  Then I dissected down to the internal jugular vein and facial vein.  The facial vein is ligated and divided between 2-0 silk ties.  This was dissected posteriorly until I obtained visualization of the common carotid artery.  This was dissected out and then a vessel loop was placed around the common carotid artery.  I then dissected in a periadventitial fashion along the common carotid artery up to the bifurcation.  I then identified the external carotid artery and the superior thyroid artery.  I ligated the superior thyroid artery as it crossed over the bifurcation and was in the way of our endarterectomy plane. I dissected out the external carotid artery and placed a vessel loop around it. In the process of this dissection, the hypoglossal nerve was identified and protected from harm.  I then dissected out the internal carotid artery until I identified an area in the internal carotid artery clearly above the stenosis.  I dissected slightly distal to this area, and placed a vessel loop around the artery.  At this point, we gave the patient 5000 units of intravenous heparin.  After this was allowed to circulate for several minutes, I pulled up control on the vessel loops to clamp the internal carotid artery, external carotid artery, superior thyroid artery, and then the common carotid artery.  I then made an arteriotomy in the common carotid artery with a 11 blade, and extended the arteriotomy with a Potts scissor down into the common carotid artery, then I carried the arteriotomy through the bifurcation into the internal carotid artery until I reached an area that was not diseased.  At this point, I took the Pruitt-[place] shunt that previously been prepared and I inserted it  into the internal carotid artery first, and then into the common carotid artery taking care to flush and de-air prior to release of control. At this point, I started the endarterectomy in  the common carotid artery with a Penfield elevator and carried this dissection down into the common carotid artery circumferentially.  Then I transected the plaque at a segment where it was adherent and transected the plaque with Potts scissors.  I then carried this dissection up into the external carotid artery.  The plaque was extracted by unclamping the external carotid artery and performing an eversion endarterectomy.  The dissection was then carried into the internal carotid artery where a nice feathered end point was created with gentle traction.  I passed the plaque off the field as a specimen. At this point I removed all loose flecks and remaining disease possible.  At this point, I was satisfied that the minimal remaining disease was densely adherent to the wall and wall integrity was intact. The distal endpoint was tacked down with three 7-0 Prolene sutures.  I then fashioned a CorMatrix arterial patch for the artery and sewed it in place with two running stitch of 6-0 Prolene.  I started at the distal endpoint and ran one half the length of the arteriotomy.  I then cut and beveled the patch to an appropriate length to match the arteriotomy.  I started the second 6-0 Prolene at the proximal end point.  The medial suture line was completed and the lateral suture line was run approximately one quarter the length of the arteriotomy.  Prior to completing this patch angioplasty, I removed the shunt first from the internal carotid artery, from which there was excellent backbleeding, and clamped it.  Then I removed the shunt from the common carotid artery, from which there was excellent antegrade bleeding, and then clamped it.  At this point, I allowed the external carotid artery to backbleed, which was excellent.  Then I instilled heparinized saline in this patched artery and then completed the patch angioplasty in the usual fashion.  First, I released the clamp on the external carotid artery, then I released it  on the common carotid artery.  After waiting a few seconds, I then released it on the internal carotid artery. Several minutes of pressure were held and 6-0 Prolene patch sutures were used as need for hemostasis.  At this point, I placed Surgicel and Evicel topical hemostatic agents.  There was no more active bleeding in the surgical site.  The sternocleidomastoid space was closed with three interrupted 3-0 Vicryl sutures. I then reapproximated the platysma muscle with a running stitch of 3-0 Vicryl.  The skin was then closed with a running subcuticular 4-0 Monocryl.  The skin was then cleaned, dried and Dermabond was used to reinforce the skin closure.  The patient awakened and was taken to the recovery room in stable condition, following commands and moving all four extremities without any apparent deficits.    COMPLICATIONS: none  CONDITION: stable  [name] [name]  10/24/2014, 9:55 AM

## 2014-10-24 NOTE — Progress Notes (Signed)
Patient is lethargic and oriented. Reporting improved pain control with IV medications. Patient unwilling to take PO medications at this time due to sore throat, improved with PRN throat spray. Left neck with unchanged mild swelling and bruising since admission to ICU. Neuro WNL. Foley in place draining to bag. Sleeping between care, will continue to monitor.

## 2014-10-24 NOTE — Progress Notes (Signed)
Dr Lucky Cowboy notified that art line is not correlating with peripheral BP readings.  Advised to check peripheral BP in left arm as well, and if correlating with right arm ok to use peripheral BP.

## 2014-10-24 NOTE — Anesthesia Postprocedure Evaluation (Signed)
  Anesthesia Post-op Note  Patient: [name] [name]  Procedure(s) Performed: Procedure(s): ENDARTERECTOMY CAROTID (Left)  Anesthesia type:General  Patient location: PACU  Post pain: Pain level controlled  Post assessment: Post-op Vital signs reviewed, Patient's Cardiovascular Status Stable, Respiratory Function Stable, Patent Airway and No signs of Nausea or vomiting  Post vital signs: Reviewed and stable  Last Vitals:  Filed Vitals:   10/24/14 1700  BP: 121/63  Pulse: 92  Temp:   Resp: 14    Level of consciousness: awake, alert  and patient cooperative  Complications: No apparent anesthesia complications

## 2014-10-24 NOTE — Transfer of Care (Signed)
Immediate Anesthesia Transfer of Care Note  Patient: [name] [name]  Procedure(s) Performed: Procedure(s): ENDARTERECTOMY CAROTID (Left)  Patient Location: PACU  Anesthesia Type:General  Level of Consciousness: awake, alert  and oriented  Airway & Oxygen Therapy: Patient Spontanous Breathing and Patient connected to face mask oxygen  Post-op Assessment: Report given to RN, Post -op Vital signs reviewed and stable and Patient moving all extremities X 4  Post vital signs: Reviewed and stable  Last Vitals:  Filed Vitals:   10/24/14 1025  BP: 119/60  Pulse: 95  Temp:   Resp: 17   Temp 37.0 Complications: No apparent anesthesia complications

## 2014-10-24 NOTE — Anesthesia Preprocedure Evaluation (Addendum)
Anesthesia Evaluation  Patient identified by MRN, date of birth, ID band Patient awake    Reviewed: Allergy & Precautions, NPO status , Patient's Chart, lab work & pertinent test results, reviewed documented beta blocker date and time   Airway Mallampati: II  TM Distance: >3 FB     Dental  (+) Chipped, Lower Dentures, Upper Dentures   Pulmonary shortness of breath, COPDCurrent Smoker,          Cardiovascular hypertension, + angina + CAD and + Peripheral Vascular Disease     Neuro/Psych PSYCHIATRIC DISORDERS  Neuromuscular disease CVA    GI/Hepatic   Endo/Other  diabetesHypothyroidism   Renal/GU      Musculoskeletal  (+) Arthritis -,   Abdominal   Peds  Hematology  (+) anemia ,   Anesthesia Other Findings Allens test OK bilaterally. Stroke 6 yrs ago. Good neck movement.  Reproductive/Obstetrics                            Anesthesia Physical Anesthesia Plan  ASA: III  Anesthesia Plan: General   Post-op Pain Management:    Induction: Intravenous  Airway Management Planned: Oral ETT  Additional Equipment:   Intra-op Plan:   Post-operative Plan:   Informed Consent: I have reviewed the patients History and Physical, chart, labs and discussed the procedure including the risks, benefits and alternatives for the proposed anesthesia with the patient or authorized representative who has indicated his/her understanding and acceptance.     Plan Discussed with: CRNA  Anesthesia Plan Comments:         Anesthesia Quick Evaluation

## 2014-10-24 NOTE — H&P (Signed)
[facility] History & Physical Update  The patient was interviewed and re-examined.  The patient's previous History and Physical has been reviewed and is unchanged.  There is no change in the plan of care. We plan to proceed with the scheduled procedure of a left CEA.  Mauriana Dann, MD  10/24/2014, 7:29 AM

## 2014-10-25 LAB — BASIC METABOLIC PANEL
ANION GAP: 6 (ref 5–15)
BUN: 11 mg/dL (ref 6–20)
CALCIUM: 7.4 mg/dL — AB (ref 8.9–10.3)
CHLORIDE: 105 mmol/L (ref 101–111)
CO2: 28 mmol/L (ref 22–32)
Creatinine, Ser: 0.78 mg/dL (ref 0.44–1.00)
GFR calc Af Amer: >60 mL/min (ref >60)
GFR calc non Af Amer: >60 mL/min (ref >60)
Glucose, Bld: 204 mg/dL — ABNORMAL HIGH (ref 65–99)
Potassium: 4.3 mmol/L (ref 3.5–5.1)
Sodium: 139 mmol/L (ref 135–145)

## 2014-10-25 LAB — CBC
HCT: 38 % (ref 35.0–47.0)
Hemoglobin: 12.7 g/dL (ref 12.0–16.0)
MCH: 31.1 pg (ref 26.0–34.0)
MCHC: 33.5 g/dL (ref 32.0–36.0)
MCV: 92.7 fL (ref 80.0–100.0)
PLATELETS: 223 10*3/uL (ref 150–440)
RBC: 4.1 MIL/uL (ref 3.80–5.20)
RDW: 14.4 % (ref 11.5–14.5)
WBC: 13.8 10*3/uL — AB (ref 3.6–11.0)

## 2014-10-25 LAB — SURGICAL PATHOLOGY

## 2014-10-25 MED ORDER — CLOPIDOGREL BISULFATE 75 MG PO TABS: 75.0000 mg | ORAL_TABLET | Freq: Every day | ORAL | Status: DC

## 2014-10-25 MED ORDER — OXYCODONE-ACETAMINOPHEN 5-325 MG PO TABS: 1.0000 | ORAL_TABLET | Freq: Four times a day (QID) | ORAL | Status: DC | PRN

## 2014-10-25 NOTE — Progress Notes (Addendum)
[facility]  Daily Progress Note  Subjective:  Patient is s/p POD #1 from a left carotid endarterectomy. She is without complaint with the exception of a sore throat. Tolerating a clear liquid diet. Denies SOB or dysphagia.   Objective: Filed Vitals:   10/25/14 0400 10/25/14 0500 10/25/14 0600 10/25/14 0700  BP: 143/61 137/61 140/62 144/58  Pulse: 89 88 90 87  Temp: 98.8 F (37.1 C)     TempSrc: Oral     Resp: '16 17 19 17  '$ Height:      Weight:      SpO2: 95% 95% 95% 95%    Intake/Output Summary (Last 24 hours) at 10/25/14 0755 Last data filed at 10/25/14 0700  Gross per 24 hour  Intake 3183.5 ml  Output   2250 ml  Net  933.5 ml    Physical Exam: A&Ox3, NAD HEAD: No facial drop, tongue midline NECK: Trachea midline, left side incision: clean dry and intact - minimal swelling.  CV: RRR Pulmonary: CTA Bilaterally Abdomen: Soft, Nontender, Nondistended Vascular: Bilateral DP pulses   Laboratory: CBC    Component Value Date/Time   WBC 13.8* 10/25/2014 0541   WBC 8.5 08/15/2013 1459   HGB 12.7 10/25/2014 0541   HCT 38.0 10/25/2014 0541   PLT 223 10/25/2014 0541    BMET    Component Value Date/Time   NA 139 10/25/2014 0541   NA 139 08/15/2013 1459   K 4.3 10/25/2014 0541   K 3.9 12/16/2011 1232   CL 105 10/25/2014 0541   CO2 28 10/25/2014 0541   GLUCOSE 204* 10/25/2014 0541   GLUCOSE 329* 08/15/2013 1459   BUN 11 10/25/2014 0541   BUN 13 08/15/2013 1459   CREATININE 0.78 10/25/2014 0541   CALCIUM 7.4* 10/25/2014 0541   GFRNONAA >60 10/25/2014 0541   GFRAA >60 10/25/2014 0541    Assessment/Planning: s/p POD #1 from a left carotid endarterectomy - doing well 1) Advance to regular diet 2) Saline lock 3) D/C foley 4) D/C A-line 5) Ambulate patient 5) If patient is tolerating a regular diet, passes her TOV and ambulating appropriately she can d/c home later this afternoon.  Marcelle Overlie PA-C 10/25/2014 7:55 AM

## 2014-10-25 NOTE — Discharge Instructions (Signed)
You may shower as of Friday.  Keep incision clean and dry.  Dermabond to fall off on its own. No driving while on pain medication or until cleared by surgeon.

## 2014-10-25 NOTE — Progress Notes (Signed)
Inpatient Diabetes Program Recommendations  AACE/ADA: New Consensus Statement on Inpatient Glycemic Control (2013)  Target Ranges:  Prepandial:   less than 140 mg/dL      Peak postprandial:   less than 180 mg/dL (1-2 hours)      Critically ill patients:  140 - 180 mg/dL  Results for EMMERY, SEILER (MRN [id number]) as of 10/25/2014 10:50  Ref. Range 10/25/2014 05:41  Glucose Latest Ref Range: 65-99 mg/dL 204 (H)   Results for XITLALLI, NEWHARD (MRN [id number]) as of 10/25/2014 10:50  Ref. Range 10/24/2014 06:17 10/24/2014 10:30  Glucose-Capillary Latest Ref Range: 65-99 mg/dL 224 (H) 198 (H)    Diabetes history: DM2 Outpatient Diabetes medications: Invokana 300 mg QHS, Amaryl 4 mg BID, Metformin 1000 mg QHS, Onglyza 5 mg QAM Current orders for Inpatient glycemic control: None  Inpatient Diabetes Program Recommendations Correction (SSI): While inpatient, please consider ordering CBGs with Novolog correction scale (using Glycemic Control order set).  Thanks, Barnie Alderman, RN, MSN, CCRN, CDE Diabetes Coordinator Inpatient Diabetes Program [phone] (Team Pager from [place] to [place]) [phone] ([facility] office) [phone] [facility] office) [phone] [facility] office)

## 2014-10-25 NOTE — Progress Notes (Signed)
Patient with discharge order in [facility], patient ambulated this am around nursing station x2 without complaint. Received ok from Dr Lucky Cowboy to discharge.  IV d/c catheters intact x2, foley removed, up to void without difficulty.  Belongings packed and returned to patient.  Discharge instructions and prescription returned to patient and family with no further questions.

## 2014-10-25 NOTE — Discharge Summary (Signed)
[name] [name]    Discharge Summary    Patient ID:  [name] [name] MRN: [id number] DOB/AGE: [birth year] 74 y.o.  Admit date: 10/24/2014 Discharge date: 10/25/2014 Date of Surgery: 10/24/2014 Surgeon: Surgeon(s): Algernon Huxley, MD Katha Cabal, MD  Admission Diagnosis: CAROTID ARTERY STENOSIS  Discharge Diagnoses:  CAROTID ARTERY STENOSIS  Secondary Diagnoses: Past Medical History  Diagnosis Date  . CAD (coronary artery disease)     Minimal nonobstructive, cath 05/2004  . Hypertension   . Diabetes mellitus   . Hyperlipemia     Followed by Dr. Rosanna Randy  . Stroke     6 yrs ago  . COPD (chronic obstructive pulmonary disease)   . Shortness of breath   . Pneumonia     hx  . Macular degeneration     rt  . COPD (chronic obstructive pulmonary disease)     Procedure(s): ENDARTERECTOMY CAROTID  Discharged Condition: good  HPI:  Patient with high grade left ICA stenosis.  Brought in for elective CEA  Hospital Course:  [name] [name] is a 74 y.o. female is S/P left Procedure(s): ENDARTERECTOMY CAROTID Extubated: in OR Physical exam: Neuro exam normal.  Neck swelling minimal.  No hematoma.  AF/VSS Post-op wounds C/D/I Pt. Ambulating, voiding and taking PO diet without difficulty. Pt pain controlled with PO pain meds. Labs as below Complications:none  Consults:   none  Significant Diagnostic Studies: CBC Lab Results  Component Value Date   WBC 13.8* 10/25/2014   HGB 12.7 10/25/2014   HCT 38.0 10/25/2014   MCV 92.7 10/25/2014   PLT 223 10/25/2014    BMET    Component Value Date/Time   NA 139 10/25/2014 0541   NA 139 08/15/2013 1459   K 4.3 10/25/2014 0541   K 3.9 12/16/2011 1232   CL 105 10/25/2014 0541   CO2 28 10/25/2014 0541   GLUCOSE 204* 10/25/2014 0541   GLUCOSE 329* 08/15/2013 1459   BUN 11 10/25/2014 0541   BUN 13 08/15/2013 1459   CREATININE 0.78 10/25/2014 0541   CALCIUM 7.4* 10/25/2014 0541   GFRNONAA >60  10/25/2014 0541   GFRAA >60 10/25/2014 0541   COAG Lab Results  Component Value Date   INR 0.95 10/17/2014   INR 0.9 08/15/2013   INR 0.86 12/30/2010     Disposition:  Discharge to :home    Medication List    STOP taking these medications        HYDROcodone-acetaminophen 5-325 MG per tablet  Commonly known as:  NORCO/VICODIN     levofloxacin 750 MG tablet  Commonly known as:  LEVAQUIN      TAKE these medications        albuterol 108 (90 BASE) MCG/ACT inhaler  Commonly known as:  PROVENTIL HFA;VENTOLIN HFA  Inhale 1-2 puffs into the lungs 2 (two) times daily.     amitriptyline 10 MG tablet  Commonly known as:  ELAVIL  Take 1 mg by mouth at bedtime.     aspirin 81 MG tablet  Take 81 mg by mouth every morning.     atorvastatin 20 MG tablet  Commonly known as:  LIPITOR  Take 1 tablet by mouth at bedtime.     beta carotene w/minerals tablet  Take 1 tablet by mouth every morning.     CENTRUM SILVER ADULT 50+ PO  Take 1 tablet by mouth daily.     budesonide-formoterol 160-4.5 MCG/ACT inhaler  Commonly known as:  SYMBICORT  Inhale 2 puffs into the  lungs 2 (two) times daily.     canagliflozin 300 MG Tabs tablet  Commonly known as:  INVOKANA  Take 1 tablet by mouth at bedtime.     cholecalciferol 1000 UNITS tablet  Commonly known as:  VITAMIN D  Take 1,000 Units by mouth every morning.     clopidogrel 75 MG tablet  Commonly known as:  PLAVIX  Take 1 tablet (75 mg total) by mouth daily.     diazepam 5 MG tablet  Commonly known as:  VALIUM  Take 1 tablet (5 mg total) by mouth every 6 (six) hours as needed for muscle spasms.     DSS 100 MG Caps  Take 100 mg by mouth 2 (two) times daily.     gabapentin 600 MG tablet  Commonly known as:  NEURONTIN  Take 600 mg by mouth 2 (two) times daily.     glimepiride 4 MG tablet  Commonly known as:  AMARYL  Take 4 mg by mouth 2 (two) times daily.     glucose blood test strip  as directed.     isosorbide  mononitrate 30 MG 24 hr tablet  Commonly known as:  IMDUR  Take 1 tablet by mouth  daily     KLOR-CON 10 PO  Take 3 tablets by mouth daily.     LANCETS ULTRA THIN Misc  as directed.     losartan-hydrochlorothiazide 100-25 MG per tablet  Commonly known as:  HYZAAR  Take 1 tablet by mouth every morning.     magnesium oxide 400 (241.3 MG) MG tablet  Commonly known as:  MAG-OX  Take 1 tablet by mouth 2 (two) times daily.     metFORMIN 1000 MG tablet  Commonly known as:  GLUCOPHAGE  Take 1,000 mg by mouth at bedtime.     nitroGLYCERIN 0.4 MG SL tablet  Commonly known as:  NITROSTAT  Place 1 tablet (0.4 mg total) under the tongue every 5 (five) minutes as needed for chest pain.     omeprazole 20 MG capsule  Commonly known as:  PRILOSEC  Take 20 mg by mouth every morning.     ONGLYZA 5 MG Tabs tablet  Generic drug:  saxagliptin HCl  Take 5 mg by mouth every morning.     oxyCODONE-acetaminophen 5-325 MG per tablet  Commonly known as:  PERCOCET/ROXICET  Take 1-2 tablets by mouth every 6 (six) hours as needed for severe pain.     predniSONE 10 MG tablet  Commonly known as:  DELTASONE  Take 1 tablet (10 mg total) by mouth daily with breakfast. Day 1 - 6 tabs, Day 2- 5 tabs, Day 3- 4 tabs, Day 4- 3 tabs, Day 5- 2 tabs, Day 6- 1 tab     Tiotropium Bromide Monohydrate 2.5 MCG/ACT Aers  Commonly known as:  SPIRIVA RESPIMAT  Inhale 2 puffs into the lungs daily.       Verbal and written Discharge instructions given to the patient. Wound care per Discharge AVS     Follow-up Information    Follow up with Wyndell Cardiff, MD In 3 weeks.   Specialties:  Vascular Surgery, Radiology, Interventional Cardiology   Why:  With Carotid Duplex Exam   Contact information:   [place] [address] [phone]       Follow up with Leotis Pain, MD. Go on 11/16/2014.   Specialties:  Vascular Surgery, Radiology, Interventional Cardiology   Why:  with carotid duplex exam  at 3:45pm    Contact information:  [address] [place] [address] [phone]       SignedLeotis Pain, MD  10/25/2014, 5:52 PM

## 2014-10-26 DIAGNOSIS — J449 Chronic obstructive pulmonary disease, unspecified: Secondary | ICD-10-CM | POA: Diagnosis not present

## 2014-10-30 ENCOUNTER — Other Ambulatory Visit: Payer: Self-pay

## 2014-10-30 MED ORDER — SAXAGLIPTIN HCL 5 MG PO TABS: 5.0000 mg | ORAL_TABLET | ORAL | Status: DC

## 2014-10-30 MED ORDER — AMITRIPTYLINE HCL 10 MG PO TABS: 1.0000 mg | ORAL_TABLET | Freq: Every day | ORAL | Status: DC

## 2014-10-30 MED ORDER — OMEPRAZOLE 20 MG PO CPDR: 20.0000 mg | DELAYED_RELEASE_CAPSULE | ORAL | Status: DC

## 2014-11-07 ENCOUNTER — Telehealth: Payer: Self-pay | Admitting: Emergency Medicine

## 2014-11-07 NOTE — Telephone Encounter (Signed)
Have changed omeprazole to Protonix/ pantoprazole on the forms. Please stop omeprazole and start pantoprazole

## 2014-11-07 NOTE — Telephone Encounter (Signed)
[facility] called to inform us that pt is taking Plavix and Omeprazole. Wanted to know if you wanted to change it.  CB# [phone] Ref # [id number]

## 2014-11-08 NOTE — Telephone Encounter (Signed)
Patient advised  ED 

## 2014-11-09 ENCOUNTER — Other Ambulatory Visit: Payer: Self-pay | Admitting: Family Medicine

## 2014-11-16 DIAGNOSIS — I6523 Occlusion and stenosis of bilateral carotid arteries: Secondary | ICD-10-CM | POA: Diagnosis not present

## 2014-11-26 DIAGNOSIS — J449 Chronic obstructive pulmonary disease, unspecified: Secondary | ICD-10-CM | POA: Diagnosis not present

## 2014-12-06 ENCOUNTER — Encounter: Payer: Self-pay | Admitting: Internal Medicine

## 2014-12-06 ENCOUNTER — Ambulatory Visit (INDEPENDENT_AMBULATORY_CARE_PROVIDER_SITE_OTHER): Payer: Medicare Other | Admitting: Internal Medicine

## 2014-12-06 VITALS — BP 118/60 | HR 94 | Ht 64.0 in | Wt 146.0 lb

## 2014-12-06 DIAGNOSIS — J438 Other emphysema: Secondary | ICD-10-CM

## 2014-12-06 MED ORDER — BUDESONIDE-FORMOTEROL FUMARATE 160-4.5 MCG/ACT IN AERO: 2.0000 | INHALATION_SPRAY | Freq: Two times a day (BID) | RESPIRATORY_TRACT | Status: DC

## 2014-12-06 MED ORDER — TIOTROPIUM BROMIDE MONOHYDRATE 2.5 MCG/ACT IN AERS: 1.0000 | INHALATION_SPRAY | Freq: Every day | RESPIRATORY_TRACT | Status: DC

## 2014-12-06 NOTE — Assessment & Plan Note (Signed)
-  stable COPD at this time- -continue Symbicort and SPiriva -albuterol as needed -continue nocturnal oxygen

## 2014-12-06 NOTE — Patient Instructions (Signed)

## 2014-12-06 NOTE — Progress Notes (Signed)
Subjective:    Patient ID: [name] [name], female    DOB: 05-10-1940, 74 y.o.   MRN: [id number]  HPI/follow up COPD/Chronic SOB  The patient comes in today for follow-up of her known COPD. She is staying on her bronchodilator regimen, and has not had a recent acute exacerbation.  She continues to smoke despite counseling, patient has nocturnal oxygen ast 3 L [place]. She has no new complaints today  BP 118/60 mmHg  Pulse 94  Ht '5\' 4"'$  (1.626 m)  Wt 146 lb (66.225 kg)  BMI 25.05 kg/m2  SpO2 95%   Review of Systems  Constitutional: Negative for fever and unexpected weight change.  HENT: Negative for congestion, dental problem, ear pain, nosebleeds, postnasal drip, rhinorrhea, sinus pressure, sneezing, sore throat and trouble swallowing.   Eyes: Negative for redness and itching.  Respiratory: Positive for shortness of breath. Negative for cough, chest tightness and wheezing.   Cardiovascular: Negative for palpitations and leg swelling.  Gastrointestinal: Negative for nausea and vomiting.  Genitourinary: Negative for dysuria.  Musculoskeletal: Negative for joint swelling.  Skin: Negative for rash.  Neurological: Negative for headaches.  Hematological: Does not bruise/bleed easily.  Psychiatric/Behavioral: Negative for dysphoric mood. The patient is not nervous/anxious.        Objective:   Physical Exam  Constitutional: She is oriented to person, place, and time. She appears well-developed and well-nourished. No distress.  HENT:  Head: Normocephalic and atraumatic.  Eyes: Conjunctivae are normal. Pupils are equal, round, and reactive to light.  Neck: Normal range of motion. Neck supple.  Cardiovascular:  No murmur heard. Pulmonary/Chest: Effort normal and breath sounds normal. No respiratory distress. She has no wheezes. She has no rales.  Abdominal: Soft. Bowel sounds are normal.  Musculoskeletal: She exhibits no edema.  Neurological: She is alert and oriented to person, place,  and time.  Skin: Skin is warm and dry. She is not diaphoretic.    Current Outpatient Prescriptions on File Prior to Visit  Medication Sig Dispense Refill  . albuterol (PROVENTIL HFA;VENTOLIN HFA) 108 (90 BASE) MCG/ACT inhaler Inhale 1-2 puffs into the lungs 2 (two) times daily.     Marland Kitchen amitriptyline (ELAVIL) 10 MG tablet Take 0.5 tablets (5 mg total) by mouth at bedtime. 90 tablet 2  . aspirin 81 MG tablet Take 81 mg by mouth every morning.     Marland Kitchen atorvastatin (LIPITOR) 20 MG tablet Take 1 tablet by mouth at bedtime.    . beta carotene w/minerals (OCUVITE) tablet Take 1 tablet by mouth every morning.     . canagliflozin (INVOKANA) 300 MG TABS tablet Take 1 tablet by mouth at bedtime.     . cholecalciferol (VITAMIN D) 1000 UNITS tablet Take 1,000 Units by mouth every morning.     . clopidogrel (PLAVIX) 75 MG tablet Take 1 tablet (75 mg total) by mouth daily. 30 tablet 6  . diazepam (VALIUM) 5 MG tablet Take 1 tablet (5 mg total) by mouth every 6 (six) hours as needed for muscle spasms. 50 tablet 1  . docusate sodium 100 MG CAPS Take 100 mg by mouth 2 (two) times daily. (Patient taking differently: Take 100 mg by mouth 2 (two) times daily as needed. ) 60 capsule 0  . gabapentin (NEURONTIN) 600 MG tablet Take 600 mg by mouth 2 (two) times daily.     Marland Kitchen glimepiride (AMARYL) 4 MG tablet Take 4 mg by mouth 2 (two) times daily.     Marland Kitchen glucose blood test strip as  directed.    . isosorbide mononitrate (IMDUR) 30 MG 24 hr tablet Take 1 tablet by mouth  daily (Patient taking differently: q am) 90 tablet 3  . LANCETS ULTRA THIN MISC as directed.    Marland Kitchen losartan-hydrochlorothiazide (HYZAAR) 100-25 MG per tablet Take 1 tablet by mouth  daily 90 tablet 3  . magnesium oxide (MAG-OX) 400 (241.3 MG) MG tablet Take 1 tablet by mouth 2 (two) times daily.    . metFORMIN (GLUCOPHAGE) 1000 MG tablet Take 1,000 mg by mouth at bedtime.     . Multiple Vitamins-Minerals (CENTRUM SILVER ADULT 50+ PO) Take 1 tablet by mouth  daily.    . nitroGLYCERIN (NITROSTAT) 0.4 MG SL tablet Place 1 tablet (0.4 mg total) under the tongue every 5 (five) minutes as needed for chest pain. 25 tablet 3  . omeprazole (PRILOSEC) 20 MG capsule Take 1 capsule (20 mg total) by mouth every morning. 90 capsule 2  . oxyCODONE-acetaminophen (PERCOCET/ROXICET) 5-325 MG per tablet Take 1-2 tablets by mouth every 6 (six) hours as needed for severe pain. 30 tablet 0  . Potassium Chloride (KLOR-CON 10 PO) Take 3 tablets by mouth daily.     . predniSONE (DELTASONE) 10 MG tablet Take 1 tablet (10 mg total) by mouth daily with breakfast. Day 1 - 6 tabs, Day 2- 5 tabs, Day 3- 4 tabs, Day 4- 3 tabs, Day 5- 2 tabs, Day 6- 1 tab 21 tablet 0  . saxagliptin HCl (ONGLYZA) 5 MG TABS tablet Take 1 tablet (5 mg total) by mouth every morning. 90 tablet 2   No current facility-administered medications on file prior to visit.        Assessment & Plan:   74 yo white female seen today for follow up COPD-Her COPD is stable at this time, I do not see any reason to get follow up PFT's. Patient responding well to inhaler regimen. No signs of exacerbation at this time  1.continue spiriva 2.contonue symbicort 3.cotnineu oxygen as needed 4.smoking cessation strongly advised  No indication for Bronch/CXR at this time. I will discuss Lung cancer Screening at next visit  I have personally obtained a history, examined the patient, evaluated Pertinent laboratory and RadioGraphic/imaging results, and  formulated the assessment and plan   The Patient requires high complexity decision making for assessment and support, frequent evaluation and titration of therapies. Time Spent with patient 30 mins   Quame Spratlin Patricia Pesa, M.D.  [facility]  Medical Director [place] Director [facility] Cardio-Pulmonary Department

## 2014-12-12 NOTE — Addendum Note (Signed)
Addendum  created 12/12/14 1330 by Gunnar Bulla, MD   Modules edited: Notes Section   Notes Section:  Delete: [id number]

## 2014-12-27 DIAGNOSIS — J449 Chronic obstructive pulmonary disease, unspecified: Secondary | ICD-10-CM | POA: Diagnosis not present

## 2015-01-01 ENCOUNTER — Ambulatory Visit (INDEPENDENT_AMBULATORY_CARE_PROVIDER_SITE_OTHER): Payer: Medicare Other | Admitting: Family Medicine

## 2015-01-01 ENCOUNTER — Encounter: Payer: Self-pay | Admitting: Family Medicine

## 2015-01-01 VITALS — BP 112/62 | HR 84 | Temp 97.5°F | Resp 18 | Ht 64.0 in | Wt 152.0 lb

## 2015-01-01 DIAGNOSIS — E1121 Type 2 diabetes mellitus with diabetic nephropathy: Secondary | ICD-10-CM

## 2015-01-01 DIAGNOSIS — Z23 Encounter for immunization: Secondary | ICD-10-CM | POA: Diagnosis not present

## 2015-01-01 LAB — POCT GLYCOSYLATED HEMOGLOBIN (HGB A1C): Hemoglobin A1C: 8.4

## 2015-01-01 NOTE — Progress Notes (Signed)
Patient ID: [name] [name], female   DOB: 10/26/1940, 74 y.o.   MRN: [id number]       Patient: [name] [name] Female    DOB: 06-Mar-1941   74 y.o.   MRN: [id number] Visit Date: 01/01/2015  Today's Provider: [name], MD   Chief Complaint  Patient presents with  . Diabetes  . Hypertension  . Hyperlipidemia   Subjective:    Diabetes She presents for her follow-up diabetic visit. She has type 2 diabetes mellitus. Her disease course has been stable. There are no hypoglycemic associated symptoms. There are no diabetic associated symptoms. Pertinent negatives for diabetes include no chest pain. There are no hypoglycemic complications. Symptoms are stable. There are no diabetic complications. Her weight is stable. She rarely participates in exercise. Her home blood glucose trend is fluctuating minimally. Her overall blood glucose range is 130-140 mg/dl. She does not see a podiatrist.Eye exam is current.  Hypertension This is a chronic problem. The problem is controlled. Pertinent negatives include no chest pain. There are no compliance problems.   Hyperlipidemia This is a chronic problem. The problem is controlled. Pertinent negatives include no chest pain, focal weakness or myalgias. There are no compliance problems.        Allergies  Allergen Reactions  . Coconut Fatty Acids Swelling    Throat swells   Previous Medications   ALBUTEROL (PROVENTIL HFA;VENTOLIN HFA) 108 (90 BASE) MCG/ACT INHALER    Inhale 1-2 puffs into the lungs 2 (two) times daily.    AMITRIPTYLINE (ELAVIL) 10 MG TABLET    Take 0.5 tablets (5 mg total) by mouth at bedtime.   ASPIRIN 81 MG TABLET    Take 81 mg by mouth every morning.    ATORVASTATIN (LIPITOR) 20 MG TABLET    Take 1 tablet by mouth at bedtime.   BETA CAROTENE W/MINERALS (OCUVITE) TABLET    Take 1 tablet by mouth every morning.    BUDESONIDE-FORMOTEROL (SYMBICORT) 160-4.5 MCG/ACT INHALER    Inhale 2 puffs into the lungs 2 (two) times daily.   BUDESONIDE-FORMOTEROL (SYMBICORT) 160-4.5 MCG/ACT INHALER    Inhale 2 puffs into the lungs 2 (two) times daily.   CANAGLIFLOZIN (INVOKANA) 300 MG TABS TABLET    Take 1 tablet by mouth at bedtime.    CHOLECALCIFEROL (VITAMIN D) 1000 UNITS TABLET    Take 1,000 Units by mouth every morning.    CLOPIDOGREL (PLAVIX) 75 MG TABLET    Take 1 tablet (75 mg total) by mouth daily.   DIAZEPAM (VALIUM) 5 MG TABLET    Take 1 tablet (5 mg total) by mouth every 6 (six) hours as needed for muscle spasms.   DOCUSATE SODIUM 100 MG CAPS    Take 100 mg by mouth 2 (two) times daily.   GABAPENTIN (NEURONTIN) 600 MG TABLET    Take 600 mg by mouth 2 (two) times daily.    GLIMEPIRIDE (AMARYL) 4 MG TABLET    Take 4 mg by mouth 2 (two) times daily.    GLUCOSE BLOOD TEST STRIP    as directed.   ISOSORBIDE MONONITRATE (IMDUR) 30 MG 24 HR TABLET    Take 1 tablet by mouth  daily   LANCETS ULTRA THIN MISC    as directed.   LOSARTAN-HYDROCHLOROTHIAZIDE (HYZAAR) 100-25 MG PER TABLET    Take 1 tablet by mouth  daily   MAGNESIUM OXIDE (MAG-OX) 400 (241.3 MG) MG TABLET    Take 1 tablet by mouth 2 (two) times daily.   METFORMIN (  GLUCOPHAGE) 1000 MG TABLET    Take 1,000 mg by mouth at bedtime.    MULTIPLE VITAMINS-MINERALS (CENTRUM SILVER ADULT 50+ PO)    Take 1 tablet by mouth daily.   NITROGLYCERIN (NITROSTAT) 0.4 MG SL TABLET    Place 1 tablet (0.4 mg total) under the tongue every 5 (five) minutes as needed for chest pain.   OMEPRAZOLE (PRILOSEC) 20 MG CAPSULE    Take 1 capsule (20 mg total) by mouth every morning.   OXYCODONE-ACETAMINOPHEN (PERCOCET/ROXICET) 5-325 MG PER TABLET    Take 1-2 tablets by mouth every 6 (six) hours as needed for severe pain.   POTASSIUM CHLORIDE (KLOR-CON 10 PO)    Take 3 tablets by mouth daily.    PREDNISONE (DELTASONE) 10 MG TABLET    Take 1 tablet (10 mg total) by mouth daily with breakfast. Day 1 - 6 tabs, Day 2- 5 tabs, Day 3- 4 tabs, Day 4- 3 tabs, Day 5- 2 tabs, Day 6- 1 tab   SAXAGLIPTIN HCL  (ONGLYZA) 5 MG TABS TABLET    Take 1 tablet (5 mg total) by mouth every morning.   TIOTROPIUM BROMIDE MONOHYDRATE (SPIRIVA RESPIMAT) 2.5 MCG/ACT AERS    Inhale 1 puff into the lungs daily.    Review of Systems  Constitutional: Negative.   Eyes: Negative.   Respiratory: Negative.   Cardiovascular: Negative.  Negative for chest pain.  Gastrointestinal: Negative.   Endocrine: Negative.   Musculoskeletal: Negative.  Negative for myalgias.  Allergic/Immunologic: Negative.   Neurological: Negative.  Negative for focal weakness.  Hematological: Negative.   Psychiatric/Behavioral: Negative.     Social History  Substance Use Topics  . Smoking status: Current Every Day Smoker -- 0.25 packs/day for 54 years    Types: Cigarettes  . Smokeless tobacco: Never Used     Comment: smokes 3 cigs daily 09/07/14. Pt instructed to quit.  . Alcohol Use: No   Objective:   BP 112/62 mmHg  Pulse 84  Temp(Src) 97.5 F (36.4 C)  Resp 18  Ht '5\' 4"'$  (1.626 m)  Wt 152 lb (68.947 kg)  BMI 26.08 kg/m2  SpO2 94%  Physical Exam  Constitutional: She appears well-developed and well-nourished.  HENT:  Head: Normocephalic and atraumatic.  Right Ear: External ear normal.  Left Ear: External ear normal.  Nose: Nose normal.  Eyes: Conjunctivae are normal.  Neck: Neck supple.  Cardiovascular: Normal rate, regular rhythm and normal heart sounds.   Pulmonary/Chest: Effort normal and breath sounds normal.  Abdominal: Soft.  Neurological: She is alert.  Skin: Skin is warm and dry.  Psychiatric: She has a normal mood and affect. Her behavior is normal. Judgment and thought content normal.        Assessment & Plan:     .1. Type 2 diabetes mellitus with diabetic nephropathy  - POCT glycosylated hemoglobin (Hb A1C)--8.3 today--was 7.9 last OV.  2. Need for influenza vaccination  - Flu vaccine HIGH DOSE PF 3.Tobacco Abuse  Pt says she has quit smoking. 4.HTN 5.HLD      [name], MD    [facility]

## 2015-01-03 ENCOUNTER — Ambulatory Visit: Payer: Self-pay | Admitting: Emergency Medicine

## 2015-01-11 ENCOUNTER — Other Ambulatory Visit: Payer: Self-pay | Admitting: Family Medicine

## 2015-01-14 ENCOUNTER — Encounter: Payer: Self-pay | Admitting: Family Medicine

## 2015-01-14 ENCOUNTER — Ambulatory Visit (INDEPENDENT_AMBULATORY_CARE_PROVIDER_SITE_OTHER): Payer: Medicare Other | Admitting: Family Medicine

## 2015-01-14 VITALS — BP 122/60 | HR 78 | Temp 98.1°F | Resp 16 | Wt 152.0 lb

## 2015-01-14 DIAGNOSIS — J441 Chronic obstructive pulmonary disease with (acute) exacerbation: Secondary | ICD-10-CM

## 2015-01-14 MED ORDER — AMOXICILLIN-POT CLAVULANATE 875-125 MG PO TABS: 1.0000 | ORAL_TABLET | Freq: Two times a day (BID) | ORAL | Status: DC

## 2015-01-14 MED ORDER — PREDNISONE 5 MG (48) PO TBPK: 5.0000 mg | ORAL_TABLET | Freq: Every day | ORAL | Status: DC

## 2015-01-14 NOTE — Progress Notes (Signed)
Patient ID: [name] [name], female   DOB: [birth year], 74 y.o.   MRN: [id number]    Subjective:  HPI Pt reports that she started have a cough, nasal congestion, sneezing, runny eyes and nose about 5 days ago. She reports that she is coughing up sputum but not much more than normal. She is more short of breath than normal. She states " every time I get a flu shot, I get sick". She reports that she kept her OX on all day yesterday and all night last night.   Prior to Admission medications   Medication Sig Start Date End Date Taking? Authorizing Provider  albuterol (PROVENTIL HFA;VENTOLIN HFA) 108 (90 BASE) MCG/ACT inhaler Inhale 1-2 puffs into the lungs 2 (two) times daily.  04/20/12  Yes Historical Provider, MD  amitriptyline (ELAVIL) 10 MG tablet Take 0.5 tablets (5 mg total) by mouth at bedtime. 10/30/14  Yes Richard Maceo Pro., MD  aspirin 81 MG tablet Take 81 mg by mouth every morning.    Yes Historical Provider, MD  atorvastatin (LIPITOR) 20 MG tablet Take 1 tablet by mouth at bedtime. 07/22/14  Yes Historical Provider, MD  beta carotene w/minerals (OCUVITE) tablet Take 1 tablet by mouth every morning.    Yes Historical Provider, MD  budesonide-formoterol (SYMBICORT) 160-4.5 MCG/ACT inhaler Inhale 2 puffs into the lungs 2 (two) times daily. 12/06/14  Yes [name], MD  budesonide-formoterol (SYMBICORT) 160-4.5 MCG/ACT inhaler Inhale 2 puffs into the lungs 2 (two) times daily. 12/06/14  Yes [name], MD  canagliflozin (INVOKANA) 300 MG TABS tablet Take 1 tablet by mouth at bedtime.  02/14/14  Yes Historical Provider, MD  cholecalciferol (VITAMIN D) 1000 UNITS tablet Take 1,000 Units by mouth every morning.    Yes Historical Provider, MD  clopidogrel (PLAVIX) 75 MG tablet Take 1 tablet (75 mg total) by mouth daily. 10/25/14  Yes Algernon Huxley, MD  diazepam (VALIUM) 5 MG tablet Take 1 tablet (5 mg total) by mouth every 6 (six) hours as needed for muscle spasms. 10/20/13  Yes Newman Pies, MD    docusate sodium 100 MG CAPS Take 100 mg by mouth 2 (two) times daily. Patient taking differently: Take 100 mg by mouth 2 (two) times daily as needed.  10/20/13  Yes Newman Pies, MD  gabapentin (NEURONTIN) 600 MG tablet Take 1 tablet by mouth two  times daily 01/11/15  Yes Richard Maceo Pro., MD  glimepiride (AMARYL) 4 MG tablet Take 4 mg by mouth 2 (two) times daily.    Yes Historical Provider, MD  glucose blood test strip as directed. 06/15/11  Yes Historical Provider, MD  isosorbide mononitrate (IMDUR) 30 MG 24 hr tablet Take 1 tablet by mouth  daily Patient taking differently: q am 07/23/14  Yes [name], MD  LANCETS ULTRA THIN MISC as directed. 06/15/11  Yes Historical Provider, MD  losartan-hydrochlorothiazide Konrad Penta) 100-25 MG per tablet Take 1 tablet by mouth  daily 11/12/14  Yes Richard Maceo Pro., MD  magnesium oxide (MAG-OX) 400 (241.3 MG) MG tablet Take 1 tablet by mouth 2 (two) times daily. 08/04/13  Yes Historical Provider, MD  metFORMIN (GLUCOPHAGE) 1000 MG tablet Take 1,000 mg by mouth at bedtime.    Yes Historical Provider, MD  Multiple Vitamins-Minerals (CENTRUM SILVER ADULT 50+ PO) Take 1 tablet by mouth daily. 05/15/10  Yes Historical Provider, MD  nitroGLYCERIN (NITROSTAT) 0.4 MG SL tablet Place 1 tablet (0.4 mg total) under the tongue every 5 (five) minutes as needed  for chest pain. 07/23/14  Yes [name], MD  omeprazole (PRILOSEC) 20 MG capsule Take 1 capsule (20 mg total) by mouth every morning. 10/30/14  Yes Richard Maceo Pro., MD  oxyCODONE-acetaminophen (PERCOCET/ROXICET) 5-325 MG per tablet Take 1-2 tablets by mouth every 6 (six) hours as needed for severe pain. 10/25/14  Yes Algernon Huxley, MD  potassium chloride (K-DUR) 10 MEQ tablet Take 3 tablets by mouth  daily as directed 01/11/15  Yes Richard Maceo Pro., MD  Potassium Chloride (KLOR-CON 10 PO) Take 3 tablets by mouth daily.  08/29/13  Yes Historical Provider, MD  predniSONE (DELTASONE) 10 MG tablet  Take 1 tablet (10 mg total) by mouth daily with breakfast. Day 1 - 6 tabs, Day 2- 5 tabs, Day 3- 4 tabs, Day 4- 3 tabs, Day 5- 2 tabs, Day 6- 1 tab 09/26/14  Yes Alanda Slim Defrancesco, MD  saxagliptin HCl (ONGLYZA) 5 MG TABS tablet Take 1 tablet (5 mg total) by mouth every morning. 10/30/14  Yes Richard Maceo Pro., MD  Tiotropium Bromide Monohydrate (SPIRIVA RESPIMAT) 2.5 MCG/ACT AERS Inhale 1 puff into the lungs daily. 12/06/14  Yes [name], MD    Patient Active Problem List   Diagnosis Date Noted  . Chronic vulvitis 09/26/2014  . Allergic reaction 09/26/2014  . Carotid stenosis 08/30/2014  . Cervical nerve root disorder 08/10/2014  . Absolute anemia 08/10/2014  . CAD in native artery 08/10/2014  . B12 deficiency 08/10/2014  . Back ache 08/10/2014  . Bronchitis, chronic ([place]) 08/10/2014  . Diabetes mellitus with polyneuropathy ([place]) 08/10/2014  . Can't get food down 08/10/2014  . Eczema of external ear 08/10/2014  . Accumulation of fluid in tissues 08/10/2014  . Gout 08/10/2014  . Adult hypothyroidism 08/10/2014  . Mononeuritis 08/10/2014  . Muscle ache 08/10/2014  . Disorder of peripheral nervous system ([place]) 08/10/2014  . Lesion of vulva 08/10/2014  . Cervical spondylosis with radiculopathy 10/19/2013  . Unstable angina ([place]) 08/15/2013  . COPD exacerbation ([place]) 03/29/2013  . CAD (coronary artery disease) 06/22/2011  . COPD (chronic obstructive pulmonary disease) with emphysema ([place]) 03/07/2010  . CHEST PAIN UNSPECIFIED 07/22/2009  . HLD (hyperlipidemia) 04/01/2009  . Malaise and fatigue 04/01/2009  . HYPERLIPIDEMIA-MIXED 01/18/2009  . TOBACCO ABUSE 01/18/2009  . HYPERTENSION, BENIGN 01/18/2009  . CLAUDICATION 01/18/2009  . Pain in limb 01/18/2009  . CAFL (chronic airflow limitation) ([place]) 01/20/2007  . Late effects of cerebrovascular disease 01/10/2007  . Essential (primary) hypertension 12/22/2006    Past Medical History  Diagnosis Date  . CAD (coronary artery  disease)     Minimal nonobstructive, cath 05/2004  . Hypertension   . Diabetes mellitus   . Hyperlipemia     Followed by Dr. Rosanna Randy  . Stroke [facility])     6 yrs ago  . COPD (chronic obstructive pulmonary disease) ([place])   . Shortness of breath   . Pneumonia     hx  . Macular degeneration     rt  . COPD (chronic obstructive pulmonary disease) ([place])     Social History   Social History  . Marital Status: Married    Spouse Name: N/A  . Number of Children: N/A  . Years of Education: N/A   Occupational History  . Retired, Environmental consultant    Social History Main Topics  . Smoking status: Current Every Day Smoker -- 0.25 packs/day for 54 years    Types: Cigarettes  . Smokeless tobacco: Never Used  Comment: smokes 3 cigs daily 09/07/14. Pt instructed to quit.  . Alcohol Use: No  . Drug Use: No  . Sexual Activity: No   Other Topics Concern  . Not on file   Social History Narrative   Married with 4 children   Gets regular exercise    Allergies  Allergen Reactions  . Coconut Fatty Acids Swelling    Throat swells    Review of Systems  Constitutional: Positive for malaise/fatigue.  HENT: Positive for congestion, ear pain and sore throat.   Eyes: Positive for discharge.  Respiratory: Positive for cough, sputum production, shortness of breath and wheezing.   Gastrointestinal: Negative.   Genitourinary: Negative.   Musculoskeletal: Negative.   Skin: Negative.   Neurological: Negative.   Endo/Heme/Allergies: Negative.   Psychiatric/Behavioral: Negative.     Immunization History  Administered Date(s) Administered  . Influenza Split 02/29/2012  . Influenza Whole 12/05/2009, 12/10/2010  . Influenza, High Dose Seasonal PF 01/01/2015  . Influenza,inj,Quad PF,36+ Mos 01/04/2013  . Influenza-Unspecified 02/04/2014  . Pneumococcal Conjugate-13 02/29/2012  . Pneumococcal Polysaccharide-23 01/05/2008  . Tdap 01/05/2012   Objective:  BP 122/60 mmHg  Pulse 78   Temp(Src) 98.1 F (36.7 C) (Oral)  Resp 16  Wt 152 lb (68.947 kg)  Physical Exam  Constitutional: She is oriented to person, place, and time and well-developed, well-nourished, and in no distress.  HENT:  Head: Normocephalic and atraumatic.  Right Ear: External ear normal.  Left Ear: External ear normal.  Nose: Nose normal.  Eyes: Conjunctivae are normal.  Neck: Neck supple.  Cardiovascular: Normal rate, regular rhythm and normal heart sounds.   Pulmonary/Chest: Effort normal. She has wheezes.  Mild inspiratory wheezes throughout  Abdominal: Soft.  Neurological: She is alert and oriented to person, place, and time.  Skin: Skin is warm and dry.  Psychiatric: Mood, memory, affect and judgment normal.    Lab Results  Component Value Date   WBC 13.8* 10/25/2014   HGB 12.7 10/25/2014   HCT 38.0 10/25/2014   PLT 223 10/25/2014   GLUCOSE 204* 10/25/2014   CHOL 195 08/30/2014   TRIG 197* 08/30/2014   HDL 47 08/30/2014   LDLCALC 109* 08/30/2014   INR 0.95 10/17/2014   HGBA1C 8.4 01/01/2015    CMP     Component Value Date/Time   NA 139 10/25/2014 0541   NA 139 08/15/2013 1459   K 4.3 10/25/2014 0541   K 3.9 12/16/2011 1232   CL 105 10/25/2014 0541   CO2 28 10/25/2014 0541   GLUCOSE 204* 10/25/2014 0541   GLUCOSE 329* 08/15/2013 1459   BUN 11 10/25/2014 0541   BUN 13 08/15/2013 1459   CREATININE 0.78 10/25/2014 0541   CALCIUM 7.4* 10/25/2014 0541   PROT 6.7 08/02/2008 2232   ALBUMIN 4.2 08/02/2008 2232   AST 10 08/02/2008 2232   ALT 10 08/02/2008 2232   ALKPHOS 77 08/02/2008 2232   BILITOT 0.3 08/02/2008 2232   GFRNONAA >60 10/25/2014 0541   GFRAA >60 10/25/2014 0541    Assessment and Plan :  1. COPD exacerbation ([place]) Treat aggressively. - predniSONE (STERAPRED UNI-PAK 48 TAB) 5 MG (48) TBPK tablet; Take 1 tablet (5 mg total) by mouth daily.  Dispense: 48 tablet; Refill: 1 - amoxicillin-clavulanate (AUGMENTIN) 875-125 MG tablet; Take 1 tablet by mouth 2 (two)  times daily.  Dispense: 14 tablet; Refill: 1 2.Smoking Pt recently quit smoking.  I have done the exam and reviewed the above chart and it is accurate to the  best of my knowledge.   Miguel Aschoff MD [facility] 01/14/2015 3:40 PM

## 2015-01-26 DIAGNOSIS — J449 Chronic obstructive pulmonary disease, unspecified: Secondary | ICD-10-CM | POA: Diagnosis not present

## 2015-01-27 ENCOUNTER — Inpatient Hospital Stay
Admission: EM | Admit: 2015-01-27 | Discharge: 2015-01-29 | DRG: 311 | Disposition: A | Discharge status: Home / Self Care | Payer: Medicare Other | Attending: Specialist | Admitting: Specialist

## 2015-01-27 ENCOUNTER — Emergency Department: Payer: Medicare Other

## 2015-01-27 ENCOUNTER — Encounter: Payer: Self-pay | Admitting: Emergency Medicine

## 2015-01-27 DIAGNOSIS — R0902 Hypoxemia: Secondary | ICD-10-CM | POA: Diagnosis not present

## 2015-01-27 DIAGNOSIS — H3530 Unspecified macular degeneration: Secondary | ICD-10-CM | POA: Diagnosis not present

## 2015-01-27 DIAGNOSIS — E1165 Type 2 diabetes mellitus with hyperglycemia: Secondary | ICD-10-CM | POA: Diagnosis present

## 2015-01-27 DIAGNOSIS — Z8673 Personal history of transient ischemic attack (TIA), and cerebral infarction without residual deficits: Secondary | ICD-10-CM

## 2015-01-27 DIAGNOSIS — Z823 Family history of stroke: Secondary | ICD-10-CM | POA: Diagnosis not present

## 2015-01-27 DIAGNOSIS — I2510 Atherosclerotic heart disease of native coronary artery without angina pectoris: Secondary | ICD-10-CM | POA: Diagnosis present

## 2015-01-27 DIAGNOSIS — F17210 Nicotine dependence, cigarettes, uncomplicated: Secondary | ICD-10-CM | POA: Diagnosis not present

## 2015-01-27 DIAGNOSIS — Z7982 Long term (current) use of aspirin: Secondary | ICD-10-CM

## 2015-01-27 DIAGNOSIS — Z90710 Acquired absence of both cervix and uterus: Secondary | ICD-10-CM

## 2015-01-27 DIAGNOSIS — E119 Type 2 diabetes mellitus without complications: Secondary | ICD-10-CM | POA: Diagnosis not present

## 2015-01-27 DIAGNOSIS — Z801 Family history of malignant neoplasm of trachea, bronchus and lung: Secondary | ICD-10-CM

## 2015-01-27 DIAGNOSIS — E871 Hypo-osmolality and hyponatremia: Secondary | ICD-10-CM | POA: Diagnosis present

## 2015-01-27 DIAGNOSIS — Z9889 Other specified postprocedural states: Secondary | ICD-10-CM | POA: Diagnosis not present

## 2015-01-27 DIAGNOSIS — R0789 Other chest pain: Secondary | ICD-10-CM | POA: Diagnosis not present

## 2015-01-27 DIAGNOSIS — N179 Acute kidney failure, unspecified: Secondary | ICD-10-CM | POA: Diagnosis not present

## 2015-01-27 DIAGNOSIS — Z981 Arthrodesis status: Secondary | ICD-10-CM

## 2015-01-27 DIAGNOSIS — I248 Other forms of acute ischemic heart disease: Secondary | ICD-10-CM | POA: Diagnosis not present

## 2015-01-27 DIAGNOSIS — R079 Chest pain, unspecified: Secondary | ICD-10-CM | POA: Diagnosis not present

## 2015-01-27 DIAGNOSIS — J441 Chronic obstructive pulmonary disease with (acute) exacerbation: Secondary | ICD-10-CM | POA: Diagnosis present

## 2015-01-27 DIAGNOSIS — Z9049 Acquired absence of other specified parts of digestive tract: Secondary | ICD-10-CM

## 2015-01-27 DIAGNOSIS — J438 Other emphysema: Secondary | ICD-10-CM

## 2015-01-27 DIAGNOSIS — E860 Dehydration: Secondary | ICD-10-CM

## 2015-01-27 DIAGNOSIS — Z79899 Other long term (current) drug therapy: Secondary | ICD-10-CM | POA: Diagnosis not present

## 2015-01-27 DIAGNOSIS — Z8249 Family history of ischemic heart disease and other diseases of the circulatory system: Secondary | ICD-10-CM | POA: Diagnosis not present

## 2015-01-27 DIAGNOSIS — Z9981 Dependence on supplemental oxygen: Secondary | ICD-10-CM

## 2015-01-27 DIAGNOSIS — I10 Essential (primary) hypertension: Secondary | ICD-10-CM | POA: Diagnosis not present

## 2015-01-27 DIAGNOSIS — E1140 Type 2 diabetes mellitus with diabetic neuropathy, unspecified: Secondary | ICD-10-CM | POA: Diagnosis present

## 2015-01-27 DIAGNOSIS — K219 Gastro-esophageal reflux disease without esophagitis: Secondary | ICD-10-CM | POA: Diagnosis not present

## 2015-01-27 DIAGNOSIS — Z9842 Cataract extraction status, left eye: Secondary | ICD-10-CM | POA: Diagnosis not present

## 2015-01-27 DIAGNOSIS — E785 Hyperlipidemia, unspecified: Secondary | ICD-10-CM | POA: Diagnosis not present

## 2015-01-27 DIAGNOSIS — I214 Non-ST elevation (NSTEMI) myocardial infarction: Secondary | ICD-10-CM | POA: Diagnosis present

## 2015-01-27 DIAGNOSIS — I340 Nonrheumatic mitral (valve) insufficiency: Secondary | ICD-10-CM | POA: Diagnosis not present

## 2015-01-27 HISTORY — DX: Atherosclerotic heart disease of native coronary artery without angina pectoris: I25.10

## 2015-01-27 LAB — GLUCOSE, CAPILLARY
GLUCOSE-CAPILLARY: 423 mg/dL — AB (ref 65–99)
GLUCOSE-CAPILLARY: 316 mg/dL — AB (ref 65–99)

## 2015-01-27 LAB — URINALYSIS COMPLETE WITH MICROSCOPIC (ARMC ONLY)
BILIRUBIN URINE: NEGATIVE
Glucose, UA: >500 mg/dL — AB
HGB URINE DIPSTICK: NEGATIVE
KETONES UR: NEGATIVE mg/dL
LEUKOCYTES UA: NEGATIVE
Nitrite: NEGATIVE
PH: 5 (ref 5.0–8.0)
Protein, ur: NEGATIVE mg/dL
SQUAMOUS EPITHELIAL / LPF: NONE SEEN
Specific Gravity, Urine: 1.014 (ref 1.005–1.030)

## 2015-01-27 LAB — BASIC METABOLIC PANEL
ANION GAP: 8 (ref 5–15)
BUN: 30 mg/dL — ABNORMAL HIGH (ref 6–20)
CO2: 27 mmol/L (ref 22–32)
Calcium: 8.7 mg/dL — ABNORMAL LOW (ref 8.9–10.3)
Chloride: 94 mmol/L — ABNORMAL LOW (ref 101–111)
Creatinine, Ser: 1.67 mg/dL — ABNORMAL HIGH (ref 0.44–1.00)
GFR calc Af Amer: 34 mL/min — ABNORMAL LOW (ref >60)
GFR calc non Af Amer: 30 mL/min — ABNORMAL LOW (ref >60)
GLUCOSE: 445 mg/dL — AB (ref 65–99)
POTASSIUM: 3.8 mmol/L (ref 3.5–5.1)
Sodium: 129 mmol/L — ABNORMAL LOW (ref 135–145)

## 2015-01-27 LAB — CBC
HEMATOCRIT: 39.2 % (ref 35.0–47.0)
HEMOGLOBIN: 13.3 g/dL (ref 12.0–16.0)
MCH: 31.3 pg (ref 26.0–34.0)
MCHC: 34 g/dL (ref 32.0–36.0)
MCV: 92 fL (ref 80.0–100.0)
Platelets: 189 10*3/uL (ref 150–440)
RBC: 4.26 MIL/uL (ref 3.80–5.20)
RDW: 14.1 % (ref 11.5–14.5)
WBC: 13.1 10*3/uL — ABNORMAL HIGH (ref 3.6–11.0)

## 2015-01-27 LAB — BLOOD GAS, VENOUS
ACID-BASE EXCESS: 5.2 mmol/L — AB (ref 0.0–3.0)
Bicarbonate: 32.6 mEq/L — ABNORMAL HIGH (ref 21.0–28.0)
FIO2: 0.21
PATIENT TEMPERATURE: 37
pCO2, Ven: 59 mmHg (ref 44.0–60.0)
pH, Ven: 7.35 (ref 7.320–7.430)

## 2015-01-27 LAB — TROPONIN I
TROPONIN I: 1.47 ng/mL — AB (ref <0.031)
Troponin I: 1.64 ng/mL — ABNORMAL HIGH (ref <0.031)

## 2015-01-27 LAB — APTT

## 2015-01-27 LAB — PROTIME-INR
INR: 0.89
Prothrombin Time: 12.2 seconds (ref 11.4–15.0)

## 2015-01-27 MED ORDER — PANTOPRAZOLE SODIUM 40 MG PO TBEC
40.0000 mg | DELAYED_RELEASE_TABLET | Freq: Every day | ORAL | Status: DC
  Administered 2015-01-28 – 2015-01-29 (×2): 40 mg via ORAL
  Filled 2015-01-27 (×2): qty 1

## 2015-01-27 MED ORDER — ASPIRIN 81 MG PO CHEW
162.0000 mg | CHEWABLE_TABLET | Freq: Once | ORAL | Status: AC
  Administered 2015-01-27: 162 mg via ORAL
  Filled 2015-01-27: qty 2

## 2015-01-27 MED ORDER — HEPARIN (PORCINE) IN NACL 100-0.45 UNIT/ML-% IJ SOLN
800.0000 [IU]/h | INTRAMUSCULAR | Status: DC
  Administered 2015-01-27: 800 [IU]/h via INTRAVENOUS
  Filled 2015-01-27: qty 250

## 2015-01-27 MED ORDER — MORPHINE SULFATE (PF) 2 MG/ML IV SOLN: 2.0000 mg | INTRAVENOUS | Status: DC | PRN

## 2015-01-27 MED ORDER — SODIUM CHLORIDE 0.9 % IJ SOLN
3.0000 mL | Freq: Two times a day (BID) | INTRAMUSCULAR | Status: DC
Start: 2015-01-27 — End: 2015-01-29
  Administered 2015-01-27 – 2015-01-29 (×4): 3 mL via INTRAVENOUS

## 2015-01-27 MED ORDER — ATORVASTATIN CALCIUM 20 MG PO TABS
20.0000 mg | ORAL_TABLET | Freq: Every day | ORAL | Status: DC
  Administered 2015-01-28 (×2): 20 mg via ORAL
  Filled 2015-01-27 (×2): qty 1

## 2015-01-27 MED ORDER — SODIUM CHLORIDE 0.9 % IV SOLN
INTRAVENOUS | Status: DC
  Administered 2015-01-27 – 2015-01-29 (×2): via INTRAVENOUS

## 2015-01-27 MED ORDER — BUDESONIDE-FORMOTEROL FUMARATE 160-4.5 MCG/ACT IN AERO
2.0000 | INHALATION_SPRAY | Freq: Two times a day (BID) | RESPIRATORY_TRACT | Status: DC
  Administered 2015-01-28 – 2015-01-29 (×3): 2 via RESPIRATORY_TRACT
  Filled 2015-01-27: qty 6

## 2015-01-27 MED ORDER — ACETAMINOPHEN 650 MG RE SUPP: 650.0000 mg | Freq: Four times a day (QID) | RECTAL | Status: DC | PRN

## 2015-01-27 MED ORDER — GABAPENTIN 600 MG PO TABS
600.0000 mg | ORAL_TABLET | Freq: Two times a day (BID) | ORAL | Status: DC
Start: 2015-01-27 — End: 2015-01-29
  Administered 2015-01-28 – 2015-01-29 (×4): 600 mg via ORAL
  Filled 2015-01-27 (×4): qty 1

## 2015-01-27 MED ORDER — ISOSORBIDE MONONITRATE ER 30 MG PO TB24
30.0000 mg | ORAL_TABLET | Freq: Every day | ORAL | Status: DC
  Administered 2015-01-28 – 2015-01-29 (×2): 30 mg via ORAL
  Filled 2015-01-27 (×2): qty 1

## 2015-01-27 MED ORDER — ASPIRIN 81 MG PO CHEW
81.0000 mg | CHEWABLE_TABLET | ORAL | Status: DC
  Administered 2015-01-28 – 2015-01-29 (×2): 81 mg via ORAL
  Filled 2015-01-27 (×2): qty 1

## 2015-01-27 MED ORDER — OCUVITE-LUTEIN PO CAPS
1.0000 | ORAL_CAPSULE | ORAL | Status: DC
  Administered 2015-01-28 – 2015-01-29 (×2): 1 via ORAL
  Filled 2015-01-27 (×2): qty 1

## 2015-01-27 MED ORDER — HEPARIN BOLUS VIA INFUSION
4000.0000 [IU] | Freq: Once | INTRAVENOUS | Status: AC
  Administered 2015-01-27: 4000 [IU] via INTRAVENOUS
  Filled 2015-01-27: qty 4000

## 2015-01-27 MED ORDER — OXYCODONE HCL 5 MG PO TABS: 5.0000 mg | ORAL_TABLET | ORAL | Status: DC | PRN

## 2015-01-27 MED ORDER — TIOTROPIUM BROMIDE MONOHYDRATE 18 MCG IN CAPS
1.0000 | ORAL_CAPSULE | Freq: Every day | RESPIRATORY_TRACT | Status: DC
  Administered 2015-01-28 – 2015-01-29 (×2): 18 ug via RESPIRATORY_TRACT
  Filled 2015-01-27: qty 5

## 2015-01-27 MED ORDER — ACETAMINOPHEN 325 MG PO TABS: 650.0000 mg | ORAL_TABLET | Freq: Four times a day (QID) | ORAL | Status: DC | PRN

## 2015-01-27 MED ORDER — SODIUM CHLORIDE 0.9 % IV BOLUS (SEPSIS)
1000.0000 mL | Freq: Once | INTRAVENOUS | Status: AC
  Administered 2015-01-27: 1000 mL via INTRAVENOUS

## 2015-01-27 MED ORDER — DOCUSATE SODIUM 100 MG PO CAPS
100.0000 mg | ORAL_CAPSULE | Freq: Two times a day (BID) | ORAL | Status: DC | PRN
  Administered 2015-01-28: 100 mg via ORAL
  Filled 2015-01-27: qty 1

## 2015-01-27 MED ORDER — LOSARTAN POTASSIUM-HCTZ 100-25 MG PO TABS: 1.0000 | ORAL_TABLET | Freq: Every day | ORAL | Status: DC

## 2015-01-27 MED ORDER — NITROGLYCERIN 0.4 MG SL SUBL: 0.4000 mg | SUBLINGUAL_TABLET | SUBLINGUAL | Status: DC | PRN

## 2015-01-27 MED ORDER — HYDROCHLOROTHIAZIDE 25 MG PO TABS
25.0000 mg | ORAL_TABLET | Freq: Every day | ORAL | Status: DC
  Administered 2015-01-28 – 2015-01-29 (×2): 25 mg via ORAL
  Filled 2015-01-27 (×2): qty 1

## 2015-01-27 MED ORDER — DIAZEPAM 5 MG PO TABS: 5.0000 mg | ORAL_TABLET | Freq: Four times a day (QID) | ORAL | Status: DC | PRN

## 2015-01-27 MED ORDER — INSULIN ASPART 100 UNIT/ML ~~LOC~~ SOLN
0.0000 [IU] | Freq: Three times a day (TID) | SUBCUTANEOUS | Status: DC
  Administered 2015-01-28: 7 [IU] via SUBCUTANEOUS
  Administered 2015-01-28: 4 [IU] via SUBCUTANEOUS
  Administered 2015-01-28: 3 [IU] via SUBCUTANEOUS
  Administered 2015-01-29: 4 [IU] via SUBCUTANEOUS
  Administered 2015-01-29: 7 [IU] via SUBCUTANEOUS
  Filled 2015-01-27: qty 7
  Filled 2015-01-27: qty 3
  Filled 2015-01-27: qty 4
  Filled 2015-01-27: qty 7

## 2015-01-27 MED ORDER — INSULIN ASPART 100 UNIT/ML ~~LOC~~ SOLN
SUBCUTANEOUS | Status: AC
  Filled 2015-01-27: qty 4

## 2015-01-27 MED ORDER — CLOPIDOGREL BISULFATE 75 MG PO TABS
75.0000 mg | ORAL_TABLET | Freq: Every day | ORAL | Status: DC
  Administered 2015-01-28 – 2015-01-29 (×2): 75 mg via ORAL
  Filled 2015-01-27 (×2): qty 1

## 2015-01-27 MED ORDER — INSULIN ASPART 100 UNIT/ML ~~LOC~~ SOLN
0.0000 [IU] | Freq: Every day | SUBCUTANEOUS | Status: DC
  Administered 2015-01-27: 4 [IU] via SUBCUTANEOUS
  Administered 2015-01-28: 2 [IU] via SUBCUTANEOUS
  Filled 2015-01-27: qty 2
  Filled 2015-01-27: qty 4

## 2015-01-27 MED ORDER — AMITRIPTYLINE HCL 10 MG PO TABS
5.0000 mg | ORAL_TABLET | Freq: Every day | ORAL | Status: DC
  Administered 2015-01-28 (×2): 5 mg via ORAL
  Filled 2015-01-27 (×2): qty 0.5
  Filled 2015-01-27: qty 1

## 2015-01-27 MED ORDER — ONDANSETRON HCL 4 MG/2ML IJ SOLN: 4.0000 mg | Freq: Four times a day (QID) | INTRAMUSCULAR | Status: DC | PRN

## 2015-01-27 MED ORDER — LOSARTAN POTASSIUM 50 MG PO TABS
100.0000 mg | ORAL_TABLET | Freq: Every day | ORAL | Status: DC
  Administered 2015-01-28 – 2015-01-29 (×2): 100 mg via ORAL
  Filled 2015-01-27 (×2): qty 2

## 2015-01-27 MED ORDER — ONDANSETRON HCL 4 MG PO TABS: 4.0000 mg | ORAL_TABLET | Freq: Four times a day (QID) | ORAL | Status: DC | PRN

## 2015-01-27 NOTE — ED Notes (Signed)
Pt reports onset of hyperglycemia since taking prednisone for past week. Today at home read >600. Pt has had increase in thirst. Also reporting intermittent chest tightness, lasting less than a minute at a time.

## 2015-01-27 NOTE — ED Notes (Addendum)
Pt states hx of Type 2 diabetes. Pt states she has been taking a 12 day course of prednisone and her CBG at home today was over 600. Pt states she took her medicine and brought her sugar down to 450 and then it went back to over 600. Pt also reports HA, weakness and general malaise for the last few days. Pt also c/o of pain in her chest.

## 2015-01-27 NOTE — ED Provider Notes (Addendum)
[facility] Emergency Department Provider Note REMINDER - THIS NOTE IS NOT A FINAL MEDICAL RECORD UNTIL IT IS SIGNED. UNTIL THEN, THE CONTENT BELOW MAY REFLECT INFORMATION FROM A DOCUMENTATION TEMPLATE, NOT THE ACTUAL PATIENT VISIT. ____________________________________________  Time seen: Approximately 7:04 PM  I have reviewed the triage vital signs and the nursing notes.   HISTORY  Chief Complaint Hyperglycemia; Headache; and Chest Pain  HPI [name] [name] is a 74 y.o. female history of multiple medical problems including diabetes, coronary disease, stroke, and endarterectomy.  Patient presents stasis for the last week she's had elevated blood sugars, been urinating frequently, and feeling occasionally dizzy and slightly off balance like she is weak or lightheaded. Today she noticed her blood sugar went up over 600 prompting evaluation in the ER. She does report feeling dehydrated, and then also today she developed some exertional pressure in the left chest with walking that went away after sitting down. This lasted only a couple of minutes, but has been slowly coming off and on throughout the day.  Describes a nonradiating chest pressure, currently no chest pain. No recent surgeries aside from endarterectomy a few months ago. Denies fevers.   Past Medical History  Diagnosis Date  . CAD (coronary artery disease)     Minimal nonobstructive, cath 05/2004  . Hypertension   . Diabetes mellitus   . Hyperlipemia     Followed by Dr. Rosanna Randy  . Stroke [facility])     6 yrs ago  . COPD (chronic obstructive pulmonary disease) ([place])   . Shortness of breath   . Pneumonia     hx  . Macular degeneration     rt  . COPD (chronic obstructive pulmonary disease) [facility])     Patient Active Problem List   Diagnosis Date Noted  . Chronic vulvitis 09/26/2014  . Allergic reaction 09/26/2014  . Carotid stenosis 08/30/2014  . Cervical nerve root disorder 08/10/2014  . Absolute  anemia 08/10/2014  . CAD in native artery 08/10/2014  . B12 deficiency 08/10/2014  . Back ache 08/10/2014  . Bronchitis, chronic ([place]) 08/10/2014  . Diabetes mellitus with polyneuropathy ([place]) 08/10/2014  . Can't get food down 08/10/2014  . Eczema of external ear 08/10/2014  . Accumulation of fluid in tissues 08/10/2014  . Gout 08/10/2014  . Adult hypothyroidism 08/10/2014  . Mononeuritis 08/10/2014  . Muscle ache 08/10/2014  . Disorder of peripheral nervous system ([place]) 08/10/2014  . Lesion of vulva 08/10/2014  . Cervical spondylosis with radiculopathy 10/19/2013  . Unstable angina ([place]) 08/15/2013  . COPD exacerbation ([place]) 03/29/2013  . CAD (coronary artery disease) 06/22/2011  . COPD (chronic obstructive pulmonary disease) with emphysema ([place]) 03/07/2010  . CHEST PAIN UNSPECIFIED 07/22/2009  . HLD (hyperlipidemia) 04/01/2009  . Malaise and fatigue 04/01/2009  . HYPERLIPIDEMIA-MIXED 01/18/2009  . TOBACCO ABUSE 01/18/2009  . HYPERTENSION, BENIGN 01/18/2009  . CLAUDICATION 01/18/2009  . Pain in limb 01/18/2009  . CAFL (chronic airflow limitation) ([place]) 01/20/2007  . Late effects of cerebrovascular disease 01/10/2007  . Essential (primary) hypertension 12/22/2006    Past Surgical History  Procedure Laterality Date  . Cardiac catheterization  05/2004  . Cholecystectomy    . Vesicovaginal fistula closure w/ tah    . Cataract extraction Left   . Back surgery  80's  . Anterior cervical decomp/discectomy fusion N/A 10/19/2013    Procedure: CERVICAL FIVE-SIX ANTERIOR CERVICAL DECOMPRESSION WITH FUSION INTERBODY PROSTHESIS PLATING AND PEEK CAGE;  Surgeon: Ophelia Charter, MD;  Location: [facility];  Service: Neurosurgery;  Laterality: N/A;  . Breast cyst excision Left     left negative   . Tonsillectomy and adenoidectomy    . Abdominal hysterectomy    . Endarterectomy Left 10/24/2014    Procedure: ENDARTERECTOMY CAROTID;  Surgeon: Algernon Huxley, MD;  Location: [facility] ORS;   Service: Vascular;  Laterality: Left;    Current Outpatient Rx  Name  Route  Sig  Dispense  Refill  . albuterol (PROVENTIL HFA;VENTOLIN HFA) 108 (90 BASE) MCG/ACT inhaler   Inhalation   Inhale 1-2 puffs into the lungs 2 (two) times daily.          Marland Kitchen amitriptyline (ELAVIL) 10 MG tablet   Oral   Take 0.5 tablets (5 mg total) by mouth at bedtime.   90 tablet   2   . amoxicillin-clavulanate (AUGMENTIN) 875-125 MG tablet   Oral   Take 1 tablet by mouth 2 (two) times daily.   14 tablet   1   . aspirin 81 MG tablet   Oral   Take 81 mg by mouth every morning.          Marland Kitchen atorvastatin (LIPITOR) 20 MG tablet   Oral   Take 1 tablet by mouth at bedtime.         . beta carotene w/minerals (OCUVITE) tablet   Oral   Take 1 tablet by mouth every morning.          . budesonide-formoterol (SYMBICORT) 160-4.5 MCG/ACT inhaler   Inhalation   Inhale 2 puffs into the lungs 2 (two) times daily.   1 Inhaler   0   . budesonide-formoterol (SYMBICORT) 160-4.5 MCG/ACT inhaler   Inhalation   Inhale 2 puffs into the lungs 2 (two) times daily.   3 Inhaler   2   . canagliflozin (INVOKANA) 300 MG TABS tablet   Oral   Take 1 tablet by mouth at bedtime.          . cholecalciferol (VITAMIN D) 1000 UNITS tablet   Oral   Take 1,000 Units by mouth every morning.          . clopidogrel (PLAVIX) 75 MG tablet   Oral   Take 1 tablet (75 mg total) by mouth daily.   30 tablet   6   . diazepam (VALIUM) 5 MG tablet   Oral   Take 1 tablet (5 mg total) by mouth every 6 (six) hours as needed for muscle spasms.   50 tablet   1   . docusate sodium 100 MG CAPS   Oral   Take 100 mg by mouth 2 (two) times daily. Patient taking differently: Take 100 mg by mouth 2 (two) times daily as needed.    60 capsule   0   . gabapentin (NEURONTIN) 600 MG tablet      Take 1 tablet by mouth two  times daily   180 tablet   1   . glimepiride (AMARYL) 4 MG tablet   Oral   Take 4 mg by mouth 2 (two)  times daily.          Marland Kitchen glucose blood test strip      as directed.         . isosorbide mononitrate (IMDUR) 30 MG 24 hr tablet      Take 1 tablet by mouth  daily Patient taking differently: q am   90 tablet   3   . LANCETS ULTRA THIN MISC      as directed.         Marland Kitchen  losartan-hydrochlorothiazide (HYZAAR) 100-25 MG per tablet      Take 1 tablet by mouth  daily   90 tablet   3   . magnesium oxide (MAG-OX) 400 (241.3 MG) MG tablet   Oral   Take 1 tablet by mouth 2 (two) times daily.         . metFORMIN (GLUCOPHAGE) 1000 MG tablet   Oral   Take 1,000 mg by mouth at bedtime.          . Multiple Vitamins-Minerals (CENTRUM SILVER ADULT 50+ PO)   Oral   Take 1 tablet by mouth daily.         . nitroGLYCERIN (NITROSTAT) 0.4 MG SL tablet   Sublingual   Place 1 tablet (0.4 mg total) under the tongue every 5 (five) minutes as needed for chest pain.   25 tablet   3   . omeprazole (PRILOSEC) 20 MG capsule   Oral   Take 1 capsule (20 mg total) by mouth every morning.   90 capsule   2   . oxyCODONE-acetaminophen (PERCOCET/ROXICET) 5-325 MG per tablet   Oral   Take 1-2 tablets by mouth every 6 (six) hours as needed for severe pain.   30 tablet   0   . potassium chloride (K-DUR) 10 MEQ tablet      Take 3 tablets by mouth  daily as directed   270 tablet   1   . Potassium Chloride (KLOR-CON 10 PO)   Oral   Take 3 tablets by mouth daily.          . predniSONE (DELTASONE) 10 MG tablet   Oral   Take 1 tablet (10 mg total) by mouth daily with breakfast. Day 1 - 6 tabs, Day 2- 5 tabs, Day 3- 4 tabs, Day 4- 3 tabs, Day 5- 2 tabs, Day 6- 1 tab   21 tablet   0   . predniSONE (STERAPRED UNI-PAK 48 TAB) 5 MG (48) TBPK tablet   Oral   Take 1 tablet (5 mg total) by mouth daily.   48 tablet   1   . saxagliptin HCl (ONGLYZA) 5 MG TABS tablet   Oral   Take 1 tablet (5 mg total) by mouth every morning.   90 tablet   2   . Tiotropium Bromide Monohydrate  (SPIRIVA RESPIMAT) 2.5 MCG/ACT AERS   Inhalation   Inhale 1 puff into the lungs daily.   12 g   3     Allergies Coconut fatty acids  Family History  Problem Relation Age of Onset  . Stroke Mother     Massive  . Heart attack Father     Massive  . Heart attack Brother   . Lung cancer Maternal Grandfather   . Heart attack Paternal Grandmother     MI  . Heart attack Brother     Social History Social History  Substance Use Topics  . Smoking status: Current Every Day Smoker -- 0.25 packs/day for 54 years    Types: Cigarettes  . Smokeless tobacco: Never Used     Comment: smokes 3 cigs daily 09/07/14. Pt instructed to quit.  . Alcohol Use: No    Review of Systems Constitutional: No fever/chills Eyes: No visual changes. ENT: No sore throat. Cardiovascular: See history of present illness Respiratory: Denies shortness of breath. Gastrointestinal: No abdominal pain.  No nausea, no vomiting.  No diarrhea.  No constipation. Genitourinary: Negative for dysuria. Musculoskeletal: Negative for back pain. Skin: Negative for rash.  Neurological: Negative for headaches, focal weakness or numbness. Feels occasionally lightheaded and dizzy, as though she states her period.  10-point ROS otherwise negative.  ____________________________________________   PHYSICAL EXAM:  VITAL SIGNS: ED Triage Vitals  Enc Vitals Group     BP 01/27/15 1822 114/66 mmHg     Pulse Rate 01/27/15 1822 75     Resp 01/27/15 1822 18     Temp 01/27/15 1822 98 F (36.7 C)     Temp Source 01/27/15 1822 Oral     SpO2 01/27/15 1822 95 %     Weight 01/27/15 1822 150 lb (68.04 kg)     Height 01/27/15 1822 '5\' 4"'$  (1.626 m)     Head Cir --      Peak Flow --      Pain Score 01/27/15 1822 8     Pain Loc --      Pain Edu? --      Excl. in [place]? --    Constitutional: Alert and oriented. Well appearing and in no acute distress. Eyes: Conjunctivae are normal. PERRL. EOMI. Head: Atraumatic. Nose: No  congestion/rhinnorhea. Mouth/Throat: Mucous membranes are moist.  Oropharynx non-erythematous. Neck: No stridor.   Cardiovascular: Normal rate, regular rhythm. Grossly normal heart sounds.  Good peripheral circulation. Respiratory: Normal respiratory effort.  No retractions. Lungs CTAB. Patient is somewhat barrel chested and does have the appearance of a COPD patient but is in no distress with clear lungs at this time. Gastrointestinal: Soft and nontender. No distention. No abdominal bruits. No CVA tenderness. Musculoskeletal: No lower extremity tenderness nor edema.  No joint effusions. Neurologic:  Normal speech and language. No gross focal neurologic deficits are appreciated though she does report blindness in left eye.  Skin:  Skin is warm, dry and intact. No rash noted. Occasional contusions and that the forearms patient reports are due to falling several times. Psychiatric: Mood and affect are normal. Speech and behavior are normal.  ____________________________________________   LABS (all labs ordered are listed, but only abnormal results are displayed)  Labs Reviewed  GLUCOSE, CAPILLARY - Abnormal; Notable for the following:    Glucose-Capillary 423 (*)    All other components within normal limits  BASIC METABOLIC PANEL - Abnormal; Notable for the following:    Sodium 129 (*)    Chloride 94 (*)    Glucose, Bld 445 (*)    BUN 30 (*)    Creatinine, Ser 1.67 (*)    Calcium 8.7 (*)    GFR calc non Af Amer 30 (*)    GFR calc Af Amer 34 (*)    All other components within normal limits  CBC - Abnormal; Notable for the following:    WBC 13.1 (*)    All other components within normal limits  TROPONIN I - Abnormal; Notable for the following:    Troponin I 1.64 (*)    All other components within normal limits  BLOOD GAS, VENOUS - Abnormal; Notable for the following:    Bicarbonate 32.6 (*)    Acid-Base Excess 5.2 (*)    All other components within normal limits  URINALYSIS  COMPLETEWITH MICROSCOPIC ([facility] ONLY)  APTT  PROTIME-INR  CBG MONITORING, ED   ____________________________________________  EKG  Reviewed and interpreted by me Ventricular rate 75 EKG time 1830 QRS 90 QTc 440 Normal sinus rhythm, very nonspecific T-wave abnormality seen in V2 and some likely wandering in V3 though possible U wave. No evidence of ST elevation, as compared with previous EKG no significant  ST ever malleus found. ____________________________________________  RADIOLOGY  DG Chest 2 View (Final result) Result time: 01/27/15 19:49:39   Final result by Rad Results In Interface (01/27/15 19:49:39)   Narrative:   CLINICAL DATA: Left chest pain. Hyperglycemia.  EXAM: CHEST 2 VIEW  COMPARISON: 08/29/2014  FINDINGS: The heart size and mediastinal contours are within normal limits. Both lungs are clear. The visualized skeletal structures are unremarkable.  IMPRESSION: Normal chest.     ____________________________________________   PROCEDURES  Procedure(s) performed: None  Critical Care performed: No   CRITICAL CARE Performed by: Delman Kitten   Total critical care time: 35  Critical care time was exclusive of separately billable procedures and treating other patients.  Critical care was necessary to treat or prevent imminent or life-threatening deterioration.  Critical care was time spent personally by me on the following activities: development of treatment plan with patient and/or surrogate as well as nursing, discussions with consultants, evaluation of patient's response to treatment, examination of patient, obtaining history from patient or surrogate, ordering and performing treatments and interventions, ordering and review of laboratory studies, ordering and review of radiographic studies, pulse oximetry and re-evaluation of patient's condition.  Patient having chest pain, troponin 1.6 indicative of non-ST elevation MI requiring  anticoagulation and cardiology consultation. ____________________________________________   INITIAL IMPRESSION / ASSESSMENT AND PLAN / ED COURSE  Pertinent labs & imaging results that were available during my care of the patient were reviewed by me and considered in my medical decision making (see chart for details).  Patient presents with weakness, hyperglycemia, and appears dehydrated with dry mucous membranes. In addition, the patient reports lightheadedness which is likely due to dehydration and hyperglycemia possibly induced by steroids. Additional consideration is that she is having exertional chest pain today and cardiac etiology is considered. She is at elevated risk for cardiac disease, has a previous history of nonobstructive coronary disease. The patient's labs are notable for acute kidney injury with GFR of 30 which was previously normal. She is definitely dehydrated, based on her concerns of lightheadedness, dehydration, hyperglycemia, and chest pain I anticipate admission for further evaluation and correction of her sugar. We will obtain chest x-ray and urinalysis to evaluate for other causes of hyperglycemia. She currently has no elevated anion gap and is not in DKA. No confusion, no signs HHNK.  ----------------------------------------- 9:09 PM on 01/27/2015 -----------------------------------------  Patient has not had any further chest pain or discomfort, troponins returned at 1.6 indicative of non-ST elevation MI. Paged cardiology, anticipate anticoagulation and admission to hospital. Patient remained symptom free at this time.  D/W Dr. Ellyn Hack. Advises admit, heparin gtt, continue to follow trop and chest pain closely.  Admitted. ____________________________________________   FINAL CLINICAL IMPRESSION(S) / ED DIAGNOSES  Final diagnoses:  Type 2 diabetes mellitus with hyperglycemia, without long-term current use of insulin (HCC)  Acute kidney injury ([place])  Dehydration   Chest pain on exertion  Non-ST elevated myocardial infarction [facility])   Repeat EKG performed at 2115 Ventricular rate 66 Reviewed and interpreted by me QRS 80 QTc 440 Reviewed and interpreted as normal sinus rhythm, no ischemic abnormality. Normal T waves.   Delman Kitten, MD 01/27/15 2116  Delman Kitten, MD 01/27/15 2117

## 2015-01-27 NOTE — Progress Notes (Signed)
ANTICOAGULATION CONSULT NOTE - Initial Consult  Pharmacy Consult for heparin drip Indication: ACS/STEMI  Allergies  Allergen Reactions  . Coconut Fatty Acids Swelling    Throat swells    Patient Measurements: Height: '5\' 4"'$  (162.6 cm) Weight: 150 lb (68.04 kg) IBW/kg (Calculated) : 54.7 Heparin Dosing Weight: 68kg  Vital Signs: Temp: 98 F (36.7 C) (10/23 2313) Temp Source: Oral (10/23 2313) BP: 120/54 mmHg (10/23 2313) Pulse Rate: 78 (10/23 2313)  Labs:  Recent Labs  01/27/15 1827 01/27/15 2115  HGB 13.3  --   HCT 39.2  --   PLT 189  --   APTT  --   < 23.0  LABPROT  --  12.2  INR  --  0.89  CREATININE 1.67*  --   TROPONINI 1.64* 1.47*    Estimated Creatinine Clearance: 28.8 mL/min (by C-G formula based on Cr of 1.67).   Medical History: Past Medical History  Diagnosis Date  . CAD (coronary artery disease)     Minimal nonobstructive, cath 05/2004  . Hypertension   . Diabetes mellitus   . Hyperlipemia     Followed by Dr. Rosanna Randy  . Stroke [facility])     6 yrs ago  . COPD (chronic obstructive pulmonary disease) ([place])   . Shortness of breath   . Pneumonia     hx  . Macular degeneration     rt  . COPD (chronic obstructive pulmonary disease) (HCC)     Medications:    Assessment: Hgb 13.3  plt 189 INR 0.89 aPTT <23  Goal of Therapy:  Heparin level 0.3-0.7 units/ml Monitor platelets by anticoagulation protocol: Yes   Plan:  4000 unit bolus and initial rate of 800 units/hr. First heparin level 8 hours after start of infusion.   Sim Boast, PharmD, BCPS  01/27/2015

## 2015-01-27 NOTE — ED Notes (Signed)
Patient transported to X-ray 

## 2015-01-27 NOTE — ED Notes (Addendum)
Pt's CBG 423 in triage.

## 2015-01-27 NOTE — H&P (Signed)
[facility]    MR#:  [id number]  DATE OF BIRTH:  08/15/1940   DATE OF ADMISSION:  01/27/2015  PRIMARY CARE PHYSICIAN: [name], MD   REQUESTING/REFERRING PHYSICIAN: Quale  CHIEF COMPLAINT:   Chief Complaint  Patient presents with  . Hyperglycemia  . Headache  . Chest Pain    HISTORY OF PRESENT ILLNESS:  [name] [name]  is a 74 y.o. female with a known history of type 2 diabetes non-insulin-requiring, essential hypertension, hyperlipidemia unspecified presenting with elevated blood glucose as well as chest pain. She states she is recent started on steroids for her URI which they're concerned she may develop a pneumonia she noticed that her blood sugars were running markedly elevated greater than 600 which is actually what prompted her to come to the hospital. Upon further questioning she also states that she's been having intermittent chest pain left chest in location tightness/pressure in quality 7/10 in intensity no worsening or relieving factors. Associated with mild shortness of breath. Currently chest pain-free. In the emergency department noted to have an elevated troponin, started on heparin  PAST MEDICAL HISTORY:   Past Medical History  Diagnosis Date  . CAD (coronary artery disease)     Minimal nonobstructive, cath 05/2004  . Hypertension   . Diabetes mellitus   . Hyperlipemia     Followed by Dr. Rosanna Randy  . Stroke [facility])     6 yrs ago  . COPD (chronic obstructive pulmonary disease) ([place])   . Shortness of breath   . Pneumonia     hx  . Macular degeneration     rt  . COPD (chronic obstructive pulmonary disease) ([place])     PAST SURGICAL HISTORY:   Past Surgical History  Procedure Laterality Date  . Cardiac catheterization  05/2004  . Cholecystectomy    . Vesicovaginal fistula closure w/ tah    . Cataract extraction Left   . Back surgery  80's  . Anterior cervical  decomp/discectomy fusion N/A 10/19/2013    Procedure: CERVICAL FIVE-SIX ANTERIOR CERVICAL DECOMPRESSION WITH FUSION INTERBODY PROSTHESIS PLATING AND PEEK CAGE;  Surgeon: Ophelia Charter, MD;  Location: [facility];  Service: Neurosurgery;  Laterality: N/A;  . Breast cyst excision Left     left negative   . Tonsillectomy and adenoidectomy    . Abdominal hysterectomy    . Endarterectomy Left 10/24/2014    Procedure: ENDARTERECTOMY CAROTID;  Surgeon: Algernon Huxley, MD;  Location: [facility] ORS;  Service: Vascular;  Laterality: Left;    SOCIAL HISTORY:   Social History  Substance Use Topics  . Smoking status: Current Every Day Smoker -- 0.25 packs/day for 54 years    Types: Cigarettes  . Smokeless tobacco: Never Used     Comment: smokes 3 cigs daily 09/07/14. Pt instructed to quit.  . Alcohol Use: No    FAMILY HISTORY:   Family History  Problem Relation Age of Onset  . Stroke Mother     Massive  . Heart attack Father     Massive  . Heart attack Brother   . Lung cancer Maternal Grandfather   . Heart attack Paternal Grandmother     MI  . Heart attack Brother     DRUG ALLERGIES:   Allergies  Allergen Reactions  . Coconut Fatty Acids Swelling    Throat swells    REVIEW OF SYSTEMS:  REVIEW OF SYSTEMS:  CONSTITUTIONAL: Denies fevers, chills,  fatigue, weakness.  EYES: Denies blurred vision, double vision, or eye pain.  EARS, NOSE, THROAT: Denies tinnitus, ear pain, hearing loss.  RESPIRATORY: denies cough, positive shortness of breath, denies wheezing  CARDIOVASCULAR: Positive chest pain, denies palpitations, edema.  GASTROINTESTINAL: Denies nausea, vomiting, diarrhea, abdominal pain.  GENITOURINARY: Denies dysuria, hematuria.  ENDOCRINE: Denies nocturia or thyroid problems. HEMATOLOGIC AND LYMPHATIC: Denies easy bruising or bleeding.  SKIN: Denies rash or lesions.  MUSCULOSKELETAL: Denies pain in neck, back, shoulder, knees, hips, or further arthritic symptoms.  NEUROLOGIC:  Denies paralysis, paresthesias.  PSYCHIATRIC: Denies anxiety or depressive symptoms. Otherwise full review of systems performed by me is negative.   MEDICATIONS AT HOME:   Prior to Admission medications   Medication Sig Start Date End Date Taking? Authorizing Provider  albuterol (PROVENTIL HFA;VENTOLIN HFA) 108 (90 BASE) MCG/ACT inhaler Inhale 1-2 puffs into the lungs 2 (two) times daily.  04/20/12   Historical Provider, MD  amitriptyline (ELAVIL) 10 MG tablet Take 0.5 tablets (5 mg total) by mouth at bedtime. 10/30/14   Richard Maceo Pro., MD  amoxicillin-clavulanate (AUGMENTIN) 875-125 MG tablet Take 1 tablet by mouth 2 (two) times daily. 01/14/15   Jerrol Banana., MD  aspirin 81 MG tablet Take 81 mg by mouth every morning.     Historical Provider, MD  atorvastatin (LIPITOR) 20 MG tablet Take 1 tablet by mouth at bedtime. 07/22/14   Historical Provider, MD  beta carotene w/minerals (OCUVITE) tablet Take 1 tablet by mouth every morning.     Historical Provider, MD  budesonide-formoterol (SYMBICORT) 160-4.5 MCG/ACT inhaler Inhale 2 puffs into the lungs 2 (two) times daily. 12/06/14   [name], MD  budesonide-formoterol (SYMBICORT) 160-4.5 MCG/ACT inhaler Inhale 2 puffs into the lungs 2 (two) times daily. 12/06/14   [name], MD  canagliflozin (INVOKANA) 300 MG TABS tablet Take 1 tablet by mouth at bedtime.  02/14/14   Historical Provider, MD  cholecalciferol (VITAMIN D) 1000 UNITS tablet Take 1,000 Units by mouth every morning.     Historical Provider, MD  clopidogrel (PLAVIX) 75 MG tablet Take 1 tablet (75 mg total) by mouth daily. 10/25/14   Algernon Huxley, MD  diazepam (VALIUM) 5 MG tablet Take 1 tablet (5 mg total) by mouth every 6 (six) hours as needed for muscle spasms. 10/20/13   Newman Pies, MD  docusate sodium 100 MG CAPS Take 100 mg by mouth 2 (two) times daily. Patient taking differently: Take 100 mg by mouth 2 (two) times daily as needed.  10/20/13   Newman Pies, MD   gabapentin (NEURONTIN) 600 MG tablet Take 1 tablet by mouth two  times daily 01/11/15   Jerrol Banana., MD  glimepiride (AMARYL) 4 MG tablet Take 4 mg by mouth 2 (two) times daily.     Historical Provider, MD  glucose blood test strip as directed. 06/15/11   Historical Provider, MD  isosorbide mononitrate (IMDUR) 30 MG 24 hr tablet Take 1 tablet by mouth  daily Patient taking differently: q am 07/23/14   [name], MD  LANCETS ULTRA THIN MISC as directed. 06/15/11   Historical Provider, MD  losartan-hydrochlorothiazide Konrad Penta) 100-25 MG per tablet Take 1 tablet by mouth  daily 11/12/14   Jerrol Banana., MD  magnesium oxide (MAG-OX) 400 (241.3 MG) MG tablet Take 1 tablet by mouth 2 (two) times daily. 08/04/13   Historical Provider, MD  metFORMIN (GLUCOPHAGE) 1000 MG tablet Take 1,000 mg by mouth at bedtime.  Historical Provider, MD  Multiple Vitamins-Minerals (CENTRUM SILVER ADULT 50+ PO) Take 1 tablet by mouth daily. 05/15/10   Historical Provider, MD  nitroGLYCERIN (NITROSTAT) 0.4 MG SL tablet Place 1 tablet (0.4 mg total) under the tongue every 5 (five) minutes as needed for chest pain. 07/23/14   [name], MD  omeprazole (PRILOSEC) 20 MG capsule Take 1 capsule (20 mg total) by mouth every morning. 10/30/14   Richard Maceo Pro., MD  oxyCODONE-acetaminophen (PERCOCET/ROXICET) 5-325 MG per tablet Take 1-2 tablets by mouth every 6 (six) hours as needed for severe pain. 10/25/14   Algernon Huxley, MD  potassium chloride (K-DUR) 10 MEQ tablet Take 3 tablets by mouth  daily as directed 01/11/15   Jerrol Banana., MD  Potassium Chloride (KLOR-CON 10 PO) Take 3 tablets by mouth daily.  08/29/13   Historical Provider, MD  predniSONE (DELTASONE) 10 MG tablet Take 1 tablet (10 mg total) by mouth daily with breakfast. Day 1 - 6 tabs, Day 2- 5 tabs, Day 3- 4 tabs, Day 4- 3 tabs, Day 5- 2 tabs, Day 6- 1 tab 09/26/14   Alanda Slim Defrancesco, MD  predniSONE (STERAPRED UNI-PAK 48 TAB) 5 MG  (48) TBPK tablet Take 1 tablet (5 mg total) by mouth daily. 01/14/15   Richard Maceo Pro., MD  saxagliptin HCl (ONGLYZA) 5 MG TABS tablet Take 1 tablet (5 mg total) by mouth every morning. 10/30/14   Richard Maceo Pro., MD  Tiotropium Bromide Monohydrate (SPIRIVA RESPIMAT) 2.5 MCG/ACT AERS Inhale 1 puff into the lungs daily. 12/06/14   [name], MD      VITAL SIGNS:  Blood pressure 135/113, pulse 72, temperature 98 F (36.7 C), temperature source Oral, resp. rate 24, height '5\' 4"'$  (1.626 m), weight 150 lb (68.04 kg), SpO2 95 %.  PHYSICAL EXAMINATION:  VITAL SIGNS: Filed Vitals:   01/27/15 2111  BP: 135/113  Pulse: 72  Temp:   Resp: 24   GENERAL:73 y.o.female currently in no acute distress.  HEAD: Normocephalic, atraumatic.  EYES: Pupils equal, round, reactive to light. Extraocular muscles intact. No scleral icterus.  MOUTH: Moist mucosal membrane. Dentition intact. No abscess noted.  EAR, NOSE, THROAT: Clear without exudates. No external lesions.  NECK: Supple. No thyromegaly. No nodules. No JVD.  PULMONARY: Clear to ascultation, without wheeze rails or rhonci. No use of accessory muscles, Good respiratory effort. good air entry bilaterally CHEST: Nontender to palpation.  CARDIOVASCULAR: S1 and S2. Regular rate and rhythm. No murmurs, rubs, or gallops. No edema. Pedal pulses 2+ bilaterally.  GASTROINTESTINAL: Soft, nontender, nondistended. No masses. Positive bowel sounds. No hepatosplenomegaly.  MUSCULOSKELETAL: No swelling, clubbing, or edema. Range of motion full in all extremities.  NEUROLOGIC: Cranial nerves II through XII are intact. No gross focal neurological deficits. Sensation intact. Reflexes intact.  SKIN: No ulceration, lesions, rashes, or cyanosis. Skin warm and dry. Turgor intact.  PSYCHIATRIC: Mood, affect within normal limits. The patient is awake, alert and oriented x 3. Insight, judgment intact.    LABORATORY PANEL:   CBC  Recent Labs Lab 01/27/15 1827   WBC 13.1*  HGB 13.3  HCT 39.2  PLT 189   ------------------------------------------------------------------------------------------------------------------  Chemistries   Recent Labs Lab 01/27/15 1827  NA 129*  K 3.8  CL 94*  CO2 27  GLUCOSE 445*  BUN 30*  CREATININE 1.67*  CALCIUM 8.7*   ------------------------------------------------------------------------------------------------------------------  Cardiac Enzymes  Recent Labs Lab 01/27/15 2115  TROPONINI 1.47*   ------------------------------------------------------------------------------------------------------------------  RADIOLOGY:  Dg Chest 2 View  01/27/2015  CLINICAL DATA:  Left chest pain.  Hyperglycemia. EXAM: CHEST  2 VIEW COMPARISON:  08/29/2014 FINDINGS: The heart size and mediastinal contours are within normal limits. Both lungs are clear. The visualized skeletal structures are unremarkable. IMPRESSION: Normal chest. Electronically Signed   By: Lorriane Shire M.D.   On: 01/27/2015 19:49    EKG:   Orders placed or performed during the hospital encounter of 01/27/15  . EKG 12-Lead  . EKG 12-Lead  . ED EKG  . ED EKG    IMPRESSION AND PLAN:   74 year old Caucasian female history of type 2 diabetes non-insulin-requiring presenting with chest pain and elevated blood glucose  1 NSTEMI: Aspirin, statin therapy heparin drip, consult cardiology, place on telemetry trend cardiac enzymes check echocardiogram 2. Type 2 diabetes poorly controlled non-insulin-requiring: Hold oral agents at insulin sliding scale high dose given recent steroid usage 3. Hyperlipidemia unspecified of the torn 4. Hyponatremia IV fluid hydration normal saline follow sodium level V. COPD unspecified: Symbicort 6. GERD without esophagitis: PPI therapy 7. Venous removal embolism prophylactic: Therapeutic heparin    All the records are reviewed and case discussed with ED provider. Management plans discussed with the patient,  family and they are in agreement.  CODE STATUS: Full  TOTAL TIME TAKING CARE OF THIS PATIENT: 45 minutes.    Hower,  Karenann Cai.D on 01/27/2015 at 10:13 PM  Between 7am to 6pm - Pager - [phone]  After 6pm: House Pager: - [facility]  Office  [phone]  CC: Primary care physician; [name], MD

## 2015-01-28 ENCOUNTER — Inpatient Hospital Stay (HOSPITAL_COMMUNITY)
Admit: 2015-01-28 | Discharge: 2015-01-28 | Disposition: A | Discharge status: Home / Self Care | Payer: Medicare Other | Attending: Internal Medicine | Admitting: Internal Medicine

## 2015-01-28 ENCOUNTER — Encounter: Payer: Self-pay | Admitting: Physician Assistant

## 2015-01-28 DIAGNOSIS — I214 Non-ST elevation (NSTEMI) myocardial infarction: Secondary | ICD-10-CM

## 2015-01-28 DIAGNOSIS — I340 Nonrheumatic mitral (valve) insufficiency: Secondary | ICD-10-CM

## 2015-01-28 LAB — CBC
HCT: 39.8 % (ref 35.0–47.0)
Hemoglobin: 13.2 g/dL (ref 12.0–16.0)
MCH: 30.5 pg (ref 26.0–34.0)
MCHC: 33.3 g/dL (ref 32.0–36.0)
MCV: 91.8 fL (ref 80.0–100.0)
Platelets: 186 10*3/uL (ref 150–440)
RBC: 4.33 MIL/uL (ref 3.80–5.20)
RDW: 14.3 % (ref 11.5–14.5)
WBC: 9.5 10*3/uL (ref 3.6–11.0)

## 2015-01-28 LAB — GLUCOSE, CAPILLARY
GLUCOSE-CAPILLARY: 149 mg/dL — AB (ref 65–99)
Glucose-Capillary: 177 mg/dL — ABNORMAL HIGH (ref 65–99)
Glucose-Capillary: 214 mg/dL — ABNORMAL HIGH (ref 65–99)
Glucose-Capillary: 201 mg/dL — ABNORMAL HIGH (ref 65–99)

## 2015-01-28 LAB — TROPONIN I
TROPONIN I: 1.03 ng/mL — AB (ref <0.031)
TROPONIN I: 1.4 ng/mL — AB (ref <0.031)
Troponin I: 1.41 ng/mL — ABNORMAL HIGH (ref <0.031)

## 2015-01-28 LAB — HEPARIN LEVEL (UNFRACTIONATED): HEPARIN UNFRACTIONATED: 0.5 [IU]/mL (ref 0.30–0.70)

## 2015-01-28 LAB — MRSA PCR SCREENING: MRSA BY PCR: NEGATIVE

## 2015-01-28 NOTE — Progress Notes (Signed)
Per Gerald Stabs. 0800 CBG was 149.

## 2015-01-28 NOTE — Progress Notes (Signed)
Per Gerald Stabs NT, CBG 177 per glucometer.

## 2015-01-28 NOTE — Consult Note (Signed)
Cardiology Consultation Note  Patient ID: SMT LOKEY, MRN: [id number], DOB/AGE: 1940-09-07 74 y.o. Admit date: 01/27/2015   Date of Consult: 01/28/2015 Primary Physician: [name], MD Primary Cardiologist: Dr. Rockey Situ, MD  Chief Complaint: Chest pain in the setting of hyperglycemia of 600 and URI Reason for Consult: Chest pain  HPI: 74 y.o. female with h/o nonobstructive CAD by cardiac cath 08/2013, history of stroke, COPD on oxygen at night, history of tobacco abuse, DM2, HTN, and HLD who presented to [facility] on 10/23 with elevated glucose levels into the 600's in the setting of COPD exacerbation currently on Augmentin and prednisone by PCP. Cardiology is consulted for elevated troponin of 1.64-->1.47-->1.40.   Cardiac cath 05/08/2004 showed minimal nonobstructive CAD in the setting of chest pain. She underwent repeat cardiac cath 12/2010 that showed 50% LAD disease, 60% RCA disease. She last underwent cardiac cath 08/2013 in the setting of left arm pain that showed no significant disease CAD. She has quit smoking but continues to have issues with her COPD and shortness of breath.  She was recently undergoing treatment for COPD exacerbation through her PCP's office requiring the need for her oxygen all day on 10/9, being seen on 01/14/2015. She was started on Augmentin and prednisone at that time per her PCP. She presented to [facility] on 10/23 with significant hyperglycemia into the 600's. She has been dealing with a cough that has been none productive for greater than 2 weeks duration.    She has been experiencing intermittent chest pain when she coughs and changes positions ever since she developed the above URI. Prior to developing the above URI she has been without any symptoms of chest pain. No exertional symptoms. Some increased SOB requiring her to use her oxygen more, both during the day and at night. Her weight has been stable. No orthopnea or early satiety.   Upon her arrival to [facility]  she was found to have troponin of 1.64-->1.47-->1.40, ECG non-acute, CXR with no acute process,  WBC 13.1, SCr 1.67, BUN 30, Na 129, K+ 3.8, glucose 423. She denied any chest pain upon arrival. She was started on a heparin gtt. Currently, without any symptoms.      Past Medical History  Diagnosis Date  . Coronary artery disease, non-occlusive     a. cath 08/2013:Minimal luminal irregs, right dominant system with no significant CAD, diffuse luminal irregs noted. Normal EF 55%, no AS or MS.   Marland Kitchen Hypertension   . Diabetes mellitus   . Hyperlipemia     Followed by Dr. Rosanna Randy  . Stroke ([place])   . COPD (chronic obstructive pulmonary disease) ([place])   . Shortness of breath   . Pneumonia     hx  . Macular degeneration     rt  . COPD (chronic obstructive pulmonary disease) (H[facility])       Most Recent Cardiac Studies: Cardiac catheterization 08-30-2013:  Coronary angiography demonstrated minor luminal irregularities.  Right dominant coronary system with no significant CAD, diffuse luminal irregularities noted. Normal EF at 55%, no AS or MR.     Surgical History:  Past Surgical History  Procedure Laterality Date  . Cardiac catheterization  05/2004  . Cholecystectomy    . Vesicovaginal fistula closure w/ tah    . Cataract extraction Left   . Back surgery  80's  . Anterior cervical decomp/discectomy fusion N/A 10/19/2013    Procedure: CERVICAL FIVE-SIX ANTERIOR CERVICAL DECOMPRESSION WITH FUSION INTERBODY PROSTHESIS PLATING AND PEEK CAGE;  Surgeon: Leonie Douglas  Arnoldo Morale, MD;  Location: [facility];  Service: Neurosurgery;  Laterality: N/A;  . Breast cyst excision Left     left negative   . Tonsillectomy and adenoidectomy    . Abdominal hysterectomy    . Endarterectomy Left 10/24/2014    Procedure: ENDARTERECTOMY CAROTID;  Surgeon: Algernon Huxley, MD;  Location: [facility];  Service: Vascular;  Laterality: Left;     Home Meds: Prior to Admission medications   Medication Sig Start Date End Date Taking?  Authorizing Provider  amitriptyline (ELAVIL) 10 MG tablet Take 0.5 tablets (5 mg total) by mouth at bedtime. Patient taking differently: Take 10 mg by mouth at bedtime.  10/30/14  Yes Richard Maceo Pro., MD  aspirin 81 MG tablet Take 81 mg by mouth every morning.    Yes Historical Provider, MD  atorvastatin (LIPITOR) 20 MG tablet Take 1 tablet by mouth at bedtime. 07/22/14  Yes Historical Provider, MD  Azelastine-Fluticasone (DYMISTA) 137-50 MCG/ACT SUSP Place 1 spray into the nose daily.   Yes Historical Provider, MD  budesonide-formoterol (SYMBICORT) 160-4.5 MCG/ACT inhaler Inhale 2 puffs into the lungs 2 (two) times daily. 12/06/14  Yes [name], MD  canagliflozin (INVOKANA) 300 MG TABS tablet Take 1 tablet by mouth at bedtime.  02/14/14  Yes Historical Provider, MD  clopidogrel (PLAVIX) 75 MG tablet Take 1 tablet (75 mg total) by mouth daily. 10/25/14  Yes Algernon Huxley, MD  diazepam (VALIUM) 5 MG tablet Take 1 tablet (5 mg total) by mouth every 6 (six) hours as needed for muscle spasms. 10/20/13  Yes Newman Pies, MD  gabapentin (NEURONTIN) 600 MG tablet Take 1 tablet by mouth two  times daily 01/11/15  Yes Richard Maceo Pro., MD  glimepiride (AMARYL) 4 MG tablet Take 4 mg by mouth 2 (two) times daily.    Yes Historical Provider, MD  isosorbide mononitrate (IMDUR) 30 MG 24 hr tablet Take 1 tablet by mouth  daily 07/23/14  Yes [name], MD  losartan-hydrochlorothiazide [facility]) 100-25 MG per tablet Take 1 tablet by mouth  daily 11/12/14  Yes Jerrol Banana., MD  magnesium oxide (MAG-OX) 400 (241.3 MG) MG tablet Take 1 tablet by mouth 2 (two) times daily. 08/04/13  Yes Historical Provider, MD  metFORMIN (GLUCOPHAGE) 1000 MG tablet Take 1,000 mg by mouth at bedtime.    Yes Historical Provider, MD  Multiple Vitamins-Minerals (CENTRUM SILVER ADULT 50+ PO) Take 1 tablet by mouth daily. 05/15/10  Yes Historical Provider, MD  Multiple Vitamins-Minerals (ICAPS) CAPS Take 2 capsules by mouth  daily.   Yes Historical Provider, MD  nitroGLYCERIN (NITROSTAT) 0.4 MG SL tablet Place 1 tablet (0.4 mg total) under the tongue every 5 (five) minutes as needed for chest pain. 07/23/14  Yes [name], MD  omeprazole (PRILOSEC) 20 MG capsule Take 1 capsule (20 mg total) by mouth every morning. 10/30/14  Yes Richard Maceo Pro., MD  oxyCODONE-acetaminophen (PERCOCET/ROXICET) 5-325 MG per tablet Take 1-2 tablets by mouth every 6 (six) hours as needed for severe pain. 10/25/14  Yes Algernon Huxley, MD  potassium chloride (K-DUR) 10 MEQ tablet Take 3 tablets by mouth  daily as directed 01/11/15  Yes Richard Maceo Pro., MD  saxagliptin HCl (ONGLYZA) 5 MG TABS tablet Take 1 tablet (5 mg total) by mouth every morning. 10/30/14  Yes Richard Maceo Pro., MD  Tiotropium Bromide Monohydrate (SPIRIVA RESPIMAT) 2.5 MCG/ACT AERS Inhale 1 puff into the lungs daily. 12/06/14  Yes Maretta Bees  Kasa, MD  amoxicillin-clavulanate (AUGMENTIN) 875-125 MG tablet Take 1 tablet by mouth 2 (two) times daily. Patient not taking: Reported on 01/27/2015 01/14/15   Jerrol Banana., MD    Inpatient Medications:  . amitriptyline  5 mg Oral QHS  . aspirin  81 mg Oral BH-q7a  . atorvastatin  20 mg Oral QHS  . budesonide-formoterol  2 puff Inhalation BID  . clopidogrel  75 mg Oral Daily  . gabapentin  600 mg Oral BID  . losartan  100 mg Oral Daily   And  . hydrochlorothiazide  25 mg Oral Daily  . insulin aspart  0-20 Units Subcutaneous TID WC  . insulin aspart  0-5 Units Subcutaneous QHS  . isosorbide mononitrate  30 mg Oral Daily  . multivitamin-lutein  1 capsule Oral BH-q7a  . pantoprazole  40 mg Oral Daily  . sodium chloride  3 mL Intravenous Q12H  . tiotropium  1 capsule Inhalation Daily   . sodium chloride 75 mL/hr at 01/27/15 2317  . heparin 800 Units/hr (01/27/15 2234)    Allergies:  Allergies  Allergen Reactions  . Coconut Fatty Acids Swelling    Throat swells    Social History   Social History  .  Marital Status: Married    Spouse Name: N/A  . Number of Children: N/A  . Years of Education: N/A   Occupational History  . Retired, Environmental consultant    Social History Main Topics  . Smoking status: Current Every Day Smoker -- 0.25 packs/day for 54 years    Types: Cigarettes  . Smokeless tobacco: Never Used     Comment: smokes 3 cigs daily 09/07/14. Pt instructed to quit.  . Alcohol Use: No  . Drug Use: No  . Sexual Activity: No   Other Topics Concern  . Not on file   Social History Narrative   Married with 4 children   Gets regular exercise     Family History  Problem Relation Age of Onset  . Stroke Mother     Massive  . Heart attack Father     Massive  . Heart attack Brother   . Lung cancer Maternal Grandfather   . Heart attack Paternal Grandmother     MI  . Heart attack Brother      Review of Systems: Review of Systems  Constitutional: Positive for malaise/fatigue and diaphoresis. Negative for fever, chills and weight loss.  HENT: Negative for congestion.   Eyes: Negative for discharge and redness.  Respiratory: Positive for cough and shortness of breath. Negative for hemoptysis, sputum production and wheezing.   Cardiovascular: Positive for chest pain. Negative for palpitations, orthopnea, claudication, leg swelling and PND.  Gastrointestinal: Negative for heartburn, nausea, vomiting, abdominal pain, blood in stool and melena.  Genitourinary: Negative for hematuria.  Musculoskeletal: Negative for myalgias, back pain, joint pain, falls and neck pain.  Skin: Negative for rash.  Neurological: Positive for dizziness and weakness. Negative for tingling, tremors, sensory change, speech change, focal weakness and loss of consciousness.  Endo/Heme/Allergies: Does not bruise/bleed easily.  Psychiatric/Behavioral: Negative for substance abuse. The patient is not nervous/anxious.   All other systems reviewed and are negative.    Labs:  Recent Labs  01/27/15 1827  01/27/15 2115 01/27/15 2332  TROPONINI 1.64* 1.47* 1.40*   Lab Results  Component Value Date   WBC 13.1* 01/27/2015   HGB 13.3 01/27/2015   HCT 39.2 01/27/2015   MCV 92.0 01/27/2015   PLT 189 01/27/2015  Recent Labs Lab 01/27/15 1827  NA 129*  K 3.8  CL 94*  CO2 27  BUN 30*  CREATININE 1.67*  CALCIUM 8.7*  GLUCOSE 445*   Lab Results  Component Value Date   CHOL 195 08/30/2014   HDL 47 08/30/2014   LDLCALC 109* 08/30/2014   TRIG 197* 08/30/2014   No results found for: DDIMER  Radiology/Studies:  Dg Chest 2 View  01/27/2015  CLINICAL DATA:  Left chest pain.  Hyperglycemia. EXAM: CHEST  2 VIEW COMPARISON:  08/29/2014 FINDINGS: The heart size and mediastinal contours are within normal limits. Both lungs are clear. The visualized skeletal structures are unremarkable. IMPRESSION: Normal chest. Electronically Signed   By: Lorriane Shire M.D.   On: 01/27/2015 19:49    EKG: NSR, 66 bpm, short PR 100 msec, no significant st/t changes   Weights: Filed Weights   01/27/15 1822  Weight: 150 lb (68.04 kg)     Physical Exam: Blood pressure 114/64, pulse 59, temperature 98 F (36.7 C), temperature source Oral, resp. rate 19, height '5\' 4"'$  (1.626 m), weight 150 lb (68.04 kg), SpO2 99 %. Body mass index is 25.73 kg/(m^2). General: Well developed, well nourished, in no acute distress. Head: Normocephalic, atraumatic, sclera non-icteric, no xanthomas, nares are without discharge.  Neck: Negative for carotid bruits. JVD not elevated. Lungs: Coarse breath sounds bilaterally. Breathing is unlabored. Heart: RRR with S1 S2. No murmurs, rubs, or gallops appreciated. Abdomen: Soft, non-tender, non-distended with normoactive bowel sounds. No hepatomegaly. No rebound/guarding. No obvious abdominal masses. Msk:  Strength and tone appear normal for age. Extremities: No clubbing or cyanosis. No edema.  Distal pedal pulses are 2+ and equal bilaterally. Neuro: Alert and oriented X 3. No  facial asymmetry. No focal deficit. Moves all extremities spontaneously. Psych:  Responds to questions appropriately with a normal affect.    Assessment and Plan:   1. Elevated troponin: -Of uncertain etiology at this time as the values are somewhat more elevated than typically excepted for possible demand ischemia and it is unclear exactly how high the peak value was as her initial value coming into the hospital was her peak value (1.64).  -Possibly demand ischemia in the setting of hyperglycemia of >600 and COPD exacerbation vs ACS/NSTEMI -Echo is pending at this time to evaluate LV function and wall motion. Prior EF 55% with prior cardiac cath 08/2013 showing no significant CAD per cath report  -If echo is normal would proceed with nuclear stress test when patient is feeling better -If echo is abnormal would likely need repeat cardiac catheterization  -Continue heparin gtt at this time until echo report is back  2. Nonobstructive CAD: -Continue aspirin 81 mg, Lipitor 20 mg, Imdur 30 mg daily -As above  3. COPD exacerbation: -Inhalers per IM  -Not currently on ABX  4. History of stroke: -On Plavix 75 mg per PCP  5. Hyperglycemia: -SSI per IM  6. Leukocytosis: -Secondary to #1 vs #3 -Not currently on ABX, consider?  7. HTN: -Controlled -Continue current medications  8. HLD: -Lipitor 20 mg   Melvern Banker, PA-C Pager: [phone] 01/28/2015, 7:53 AM

## 2015-01-28 NOTE — Progress Notes (Signed)
[place] for heparin drip Indication: ACS/STEMI  Allergies  Allergen Reactions  . Coconut Fatty Acids Swelling    Throat swells    Patient Measurements: Height: '5\' 4"'$  (162.6 cm) Weight: 150 lb (68.04 kg) IBW/kg (Calculated) : 54.7 Heparin Dosing Weight: 68kg  Vital Signs: Temp: 97.8 F (36.6 C) (10/24 1146) Temp Source: Oral (10/24 1146) BP: 107/58 mmHg (10/24 1146) Pulse Rate: 67 (10/24 1146)  Labs:  Recent Labs  01/27/15 1827 01/27/15 2115 01/27/15 2332 01/28/15 0619 01/28/15 0649 01/28/15 1253  HGB 13.3  --   --  13.2  --   --   HCT 39.2  --   --  39.8  --   --   PLT 189  --   --  186  --   --   APTT  --   < 23.0  --   --   --   --   LABPROT  --  12.2  --   --   --   --   INR  --  0.89  --   --   --   --   HEPARINUNFRC  --   --   --  0.50  --   --   CREATININE 1.67*  --   --   --   --   --   TROPONINI 1.64* 1.47* 1.40*  --  1.41* 1.03*    Estimated Creatinine Clearance: 28.8 mL/min (by C-G formula based on Cr of 1.67).   Medical History: Past Medical History  Diagnosis Date  . Coronary artery disease, non-occlusive     a. cath 2006: min nonobs CAD; b. cath 12/2010: cath LAD 50%, RCA 60%; c. 08/2013: Minimal luminal irregs, right dominant system with no significant CAD, diffuse luminal irregs noted. Normal EF 55%, no AS or MS.   Marland Kitchen Hypertension   . Diabetes mellitus   . Hyperlipemia     Followed by Dr. Rosanna Randy  . Stroke ([place])   . COPD (chronic obstructive pulmonary disease) ([place])   . Shortness of breath   . Pneumonia     hx  . Macular degeneration     rt  . COPD (chronic obstructive pulmonary disease) (H[facility])     Medications:    Assessment:   74 yo female currently ordered heparin drip at 800 units/hr. Patient has elevated troponin.   Goal of Therapy:  Heparin level 0.3-0.7 units/ml Monitor platelets by anticoagulation protocol: Yes   Plan:  Will obtain confirmatory level at 1730.   Pharmacy will  continue to monitor and adjust per consult.   Currie Paris, PharmD, BCPS  01/28/2015

## 2015-01-28 NOTE — Progress Notes (Signed)
[place] [facility] [place] NAME: [name] [name]    MR#:  [id number]  DATE OF BIRTH:  05-13-1940  SUBJECTIVE:  CHIEF COMPLAINT:   Chief Complaint  Patient presents with  . Hyperglycemia  . Headache  . Chest Pain   Patient presented to the hospital due to elevated blood sugars. Also complaining of left-sided pleuritic chest pain and noted to have an elevated troponin. She was admitted to the hospital with the working diagnosis of a non-ST elevation MI. Currently still having intermittent chest pain.  REVIEW OF SYSTEMS:    Review of Systems  Constitutional: Negative for fever and chills.  HENT: Negative for congestion and tinnitus.   Eyes: Negative for blurred vision and double vision.  Respiratory: Negative for cough, shortness of breath and wheezing.   Cardiovascular: Positive for chest pain. Negative for orthopnea and PND.  Gastrointestinal: Negative for nausea, vomiting, abdominal pain and diarrhea.  Genitourinary: Negative for dysuria and hematuria.  Neurological: Negative for dizziness, sensory change and focal weakness.  All other systems reviewed and are negative.   Nutrition: Heart healthy Tolerating Diet: Yes Tolerating PT: Await Evaluation   DRUG ALLERGIES:   Allergies  Allergen Reactions  . Coconut Fatty Acids Swelling    Throat swells    VITALS:  Blood pressure 107/58, pulse 67, temperature 97.8 F (36.6 C), temperature source Oral, resp. rate 18, height '5\' 4"'$  (1.626 m), weight 68.04 kg (150 lb), SpO2 96 %.  PHYSICAL EXAMINATION:   Physical Exam  GENERAL:  74 y.o.-year-old patient lying in the bed with no acute distress.  EYES: Pupils equal, round, reactive to light and accommodation. No scleral icterus. Extraocular muscles intact.  HEENT: Head atraumatic, normocephalic. Oropharynx and nasopharynx clear.  NECK:  Supple, no jugular venous distention. No thyroid enlargement, no tenderness.  LUNGS: Prolonged  inspiratory and expiratory phase. Positive and expiratory wheezing bilaterally, no rales/rhonchi. No use of accessory muscles of respiration.  CARDIOVASCULAR: S1, S2 normal. No murmurs, rubs, or gallops.  ABDOMEN: Soft, nontender, nondistended. Bowel sounds present. No organomegaly or mass.  EXTREMITIES: No cyanosis, clubbing or edema b/l.    NEUROLOGIC: Cranial nerves II through XII are intact. No focal Motor or sensory deficits b/l.   PSYCHIATRIC: The patient is alert and oriented x 3. Good affect SKIN: No obvious rash, lesion, or ulcer.    LABORATORY PANEL:   CBC  Recent Labs Lab 01/28/15 0619  WBC 9.5  HGB 13.2  HCT 39.8  PLT 186   ------------------------------------------------------------------------------------------------------------------  Chemistries   Recent Labs Lab 01/27/15 1827  NA 129*  K 3.8  CL 94*  CO2 27  GLUCOSE 445*  BUN 30*  CREATININE 1.67*  CALCIUM 8.7*   ------------------------------------------------------------------------------------------------------------------  Cardiac Enzymes  Recent Labs Lab 01/28/15 1253  TROPONINI 1.03*   ------------------------------------------------------------------------------------------------------------------  RADIOLOGY:  Dg Chest 2 View  01/27/2015  CLINICAL DATA:  Left chest pain.  Hyperglycemia. EXAM: CHEST  2 VIEW COMPARISON:  08/29/2014 FINDINGS: The heart size and mediastinal contours are within normal limits. Both lungs are clear. The visualized skeletal structures are unremarkable. IMPRESSION: Normal chest. Electronically Signed   By: Lorriane Shire M.D.   On: 01/27/2015 19:49     ASSESSMENT AND PLAN:   74 year old female with past medical history of hypertension, diabetes, history of coronary disease, history of previous CVA, macular degeneration, ongoing tobacco abuse, who presented to the hospital with hyperglycemia but also noted to have chest pain with an elevated troponin.  #1  non-ST elevation MI-this is working diagnosis given patient's chest pain and elevated troponin. -Patient is currently chest pain-free and hemodynamically stable. -Seen by cardiology and had a cardiac catheterization in March 2015 showing minimal coronary disease. Plan as per cardiology is to review her two-dimensional echocardiogram to see if she has any worsening LV dysfunction or wall motion abnormalities. If echocardiogram is abnormal we will proceed with cardiac catheter but with stable then likely to get a functional nuclear medicine stress test. -Continue heparin, aspirin, Plavix, statin, Imdur, losartan.  #2 COPD-no acute exacerbation. -Continue Symbicort, Spiriva.  #3 diabetes type 2 without complication-continue sliding scale insulin.  #4 hyperlipidemia-continue atorvastatin.  #5 diabetic neuropathy-continue Neurontin.  #6 GERD-continue Protonix.  #7 hypertension-continue losartan, HCTZ, Imdur.   All the records are reviewed and case discussed with Care Management/Social Workerr. Management plans discussed with the patient, family and they are in agreement.  CODE STATUS: Full  DVT Prophylaxis: Heparin drip  TOTAL TIME TAKING CARE OF THIS PATIENT: 30 minutes.   POSSIBLE D/C IN 1-2 DAYS, DEPENDING ON CLINICAL CONDITION.   Henreitta Leber M.D on 01/28/2015 at 3:34 PM  Between 7am to 6pm - Pager - [phone]  After 6pm go to [email or web address] - password [email or web address] [facility]  Office  [phone]  CC: Primary care physician; [name], MD

## 2015-01-28 NOTE — Progress Notes (Signed)
I completed the Education with Ms. Dimaio for the Health Directive forms.  Her husband will be up later in the day.  They will complete their part then and have the nurse contact the chaplain when they are ready to have it notarized.   [place] [address]

## 2015-01-28 NOTE — Care Management (Signed)
Presents from home with complaints of weakness and elevated blood sugar that had been greater than 600.   Upon further assessment also complaining of chest pain.Has ruled in for nstemi.  Cardiology consult is pending

## 2015-01-28 NOTE — Progress Notes (Signed)
*  PRELIMINARY RESULTS* Echocardiogram 2D Echocardiogram has been performed.  [name] [name] 01/28/2015, 3:45 PM

## 2015-01-29 ENCOUNTER — Encounter: Payer: Self-pay | Admitting: Radiology

## 2015-01-29 ENCOUNTER — Encounter: Payer: Self-pay | Admitting: *Deleted

## 2015-01-29 ENCOUNTER — Other Ambulatory Visit: Payer: Self-pay | Admitting: *Deleted

## 2015-01-29 ENCOUNTER — Inpatient Hospital Stay (HOSPITAL_COMMUNITY): Payer: Medicare Other

## 2015-01-29 DIAGNOSIS — R079 Chest pain, unspecified: Secondary | ICD-10-CM

## 2015-01-29 LAB — BASIC METABOLIC PANEL
ANION GAP: 7 (ref 5–15)
BUN: 18 mg/dL (ref 6–20)
CHLORIDE: 103 mmol/L (ref 101–111)
CO2: 30 mmol/L (ref 22–32)
CREATININE: 0.81 mg/dL (ref 0.44–1.00)
Calcium: 8.3 mg/dL — ABNORMAL LOW (ref 8.9–10.3)
GFR calc non Af Amer: >60 mL/min (ref >60)
Glucose, Bld: 176 mg/dL — ABNORMAL HIGH (ref 65–99)
Potassium: 4.4 mmol/L (ref 3.5–5.1)
SODIUM: 140 mmol/L (ref 135–145)

## 2015-01-29 LAB — CBC
HCT: 36.8 % (ref 35.0–47.0)
HEMOGLOBIN: 12.4 g/dL (ref 12.0–16.0)
MCH: 30.8 pg (ref 26.0–34.0)
MCHC: 33.5 g/dL (ref 32.0–36.0)
MCV: 91.9 fL (ref 80.0–100.0)
PLATELETS: 167 10*3/uL (ref 150–440)
RBC: 4.01 MIL/uL (ref 3.80–5.20)
RDW: 13.9 % (ref 11.5–14.5)
WBC: 8.9 10*3/uL (ref 3.6–11.0)

## 2015-01-29 LAB — NM MYOCAR MULTI W/SPECT W/WALL MOTION / EF
CHL CUP NUCLEAR SRS: 3
CHL CUP NUCLEAR SSS: 3
CHL CUP RESTING HR STRESS: 65 {beats}/min
CSEPED: 0 min
CSEPHR: 58 %
CSEPPHR: 86 {beats}/min
Estimated workload: 1 METS
Exercise duration (sec): 0 s
LVDIAVOL: 93 mL
LVSYSVOL: 44 mL
MPHR: 148 {beats}/min
NUC STRESS TID: 0.65
SDS: 4

## 2015-01-29 LAB — GLUCOSE, CAPILLARY
GLUCOSE-CAPILLARY: 161 mg/dL — AB (ref 65–99)
GLUCOSE-CAPILLARY: 207 mg/dL — AB (ref 65–99)

## 2015-01-29 MED ORDER — TECHNETIUM TC 99M SESTAMIBI - CARDIOLITE
12.5500 | Freq: Once | INTRAVENOUS | Status: AC | PRN
  Administered 2015-01-29: 12.55 via INTRAVENOUS

## 2015-01-29 MED ORDER — TECHNETIUM TC 99M SESTAMIBI - CARDIOLITE
32.2890 | Freq: Once | INTRAVENOUS | Status: AC | PRN
  Administered 2015-01-29: 32.289 via INTRAVENOUS

## 2015-01-29 MED ORDER — REGADENOSON 0.4 MG/5ML IV SOLN
0.4000 mg | Freq: Once | INTRAVENOUS | Status: AC
  Administered 2015-01-29: 0.4 mg via INTRAVENOUS

## 2015-01-29 NOTE — Patient Outreach (Signed)
[facility]) Care Management  01/29/2015  [name] [name] November 29, 1940 [id number]   Referral from Joylene Draft, RN to assign Community RN, assigned Kathie Rhodes, RN.  Thanks, Ronnell Freshwater. [place], [place] Assistant Phone: [phone] Fax: [phone]

## 2015-01-29 NOTE — Care Management (Signed)
Patient is not interested about any home health services if it is not someone that  will help work around the house.  She is interested in [facility] and asks that her PCP be contacted about referral.  Patient has nocturnal home 02.  Primary nurse has checked room air exertional sats and reports that patient does not require continuous 02.  Denies issuses obtaining meds

## 2015-01-29 NOTE — Discharge Instructions (Signed)
°  DIET:  Cardiac diet and Diabetic diet  DISCHARGE CONDITION:  Stable  ACTIVITY:  Activity as tolerated  OXYGEN:  Home Oxygen: Yes.     Oxygen Delivery: 3 liters/min via Patient connected to nasal cannula oxygen  DISCHARGE LOCATION:  home   If you experience worsening of your admission symptoms, develop shortness of breath, life threatening emergency, suicidal or homicidal thoughts you must seek medical attention immediately by calling 911 or calling your MD immediately  if symptoms less severe.  You Must read complete instructions/literature along with all the possible adverse reactions/side effects for all the Medicines you take and that have been prescribed to you. Take any new Medicines after you have completely understood and accpet all the possible adverse reactions/side effects.   Please note  You were cared for by a hospitalist during your hospital stay. If you have any questions about your discharge medications or the care you received while you were in the hospital after you are discharged, you can call the unit and asked to speak with the hospitalist on call if the hospitalist that took care of you is not available. Once you are discharged, your primary care physician will handle any further medical issues. Please note that NO REFILLS for any discharge medications will be authorized once you are discharged, as it is imperative that you return to your primary care physician (or establish a relationship with a primary care physician if you do not have one) for your aftercare needs so that they can reassess your need for medications and monitor your lab values.

## 2015-01-29 NOTE — Progress Notes (Signed)
Patient has rested quietly tonight. No complaints of pain and no signs of discomfort or distress noted. Patient remains NPO for scheduled stress test. Nursing staff will continue to monitor. Earleen Reaper, RN

## 2015-01-29 NOTE — Discharge Summary (Signed)
[facility] NAME: [name] [name]    MR#:  [id number]  DATE OF BIRTH:  04-May-1940  DATE OF ADMISSION:  01/27/2015 ADMITTING PHYSICIAN: Lytle Butte, MD  DATE OF DISCHARGE: 01/29/2015  PRIMARY CARE PHYSICIAN: [name], MD    ADMISSION DIAGNOSIS:  Dehydration [E86.0] Chest pain on exertion [R07.9] Acute kidney injury ([place]) [N17.9] Non-ST elevated myocardial infarction [facility]) [I21.4] Type 2 diabetes mellitus with hyperglycemia, without long-term current use of insulin ([place]) [E11.65]  DISCHARGE DIAGNOSIS:  Principal Problem:   NSTEMI (non-ST elevated myocardial infarction) ([place])   SECONDARY DIAGNOSIS:   Past Medical History  Diagnosis Date  . Coronary artery disease, non-occlusive     a. cath 2006: min nonobs CAD; b. cath 12/2010: cath LAD 50%, RCA 60%; c. 08/2013: Minimal luminal irregs, right dominant system with no significant CAD, diffuse luminal irregs noted. Normal EF 55%, no AS or MS.   Marland Kitchen Hypertension   . Diabetes mellitus   . Hyperlipemia     Followed by Dr. Rosanna Randy  . Stroke ([place])   . COPD (chronic obstructive pulmonary disease) ([place])   . Shortness of breath   . Pneumonia     hx  . Macular degeneration     rt  . COPD (chronic obstructive pulmonary disease) [facility])     HOSPITAL COURSE:   74 year old female with past medical history of hypertension, diabetes, history of coronary disease, history of previous CVA, macular degeneration, ongoing tobacco abuse, who presented to the hospital with hyperglycemia but also noted to have chest pain with an elevated troponin.  #1 non-ST elevation MI-this was the working diagnosis given patient's chest pain and elevated troponin. -Patient was admitted to the hospital started on a heparin nomogram and maintained on aspirin, Plavix, statin, indoor and losartan. -A cardiology consult was obtained and the recommended getting an echocardiogram and evaluating LV  function as patient had a recent catheterization last year which showed no significant coronary disease. The echocardiogram done showed normal ejection fraction and therefore patient underwent a functional study the day after which showed normal ejection fraction and no evidence of ischemia. -Patient is currently chest pain-free and hemodynamically stable and therefore being discharged home. The troponin elevation was likely secondary to demand ischemia from hypoxia from underlying COPD. There was no evidence of acute coronary syndrome  #2 COPD-no acute exacerbation while in the hospital. -Patient will Continue Symbicort, Spiriva.  #3 diabetes type 2 without complication-blood sugars remained stable. Patient will continue her metformin and glipizide, Invokana, Onglyza.  #4 hyperlipidemia-patient will continue atorvastatin.  #5 diabetic neuropathy-patient will continue Neurontin.  #6 GERD-patient will continue Protonix.  #7 hypertension-patient will continue losartan, HCTZ, Imdur.  Since pt. Is clinically feeling well she is being discharged home. No new changes to her medications.   DISCHARGE CONDITIONS:   Stable  CONSULTS OBTAINED:  Treatment Team:  Lytle Butte, MD [name], MD  DRUG ALLERGIES:   Allergies  Allergen Reactions  . Coconut Fatty Acids Swelling    Throat swells    DISCHARGE MEDICATIONS:   Current Discharge Medication List    CONTINUE these medications which have NOT CHANGED   Details  amitriptyline (ELAVIL) 10 MG tablet Take 0.5 tablets (5 mg total) by mouth at bedtime. Qty: 90 tablet, Refills: 2    aspirin 81 MG tablet Take 81 mg by mouth every morning.     atorvastatin (LIPITOR) 20 MG tablet Take 1 tablet by mouth at bedtime.  Azelastine-Fluticasone (DYMISTA) 137-50 MCG/ACT SUSP Place 1 spray into the nose daily.    budesonide-formoterol (SYMBICORT) 160-4.5 MCG/ACT inhaler Inhale 2 puffs into the lungs 2 (two) times daily. Qty: 1 Inhaler,  Refills: 0   Associated Diagnoses: Other emphysema ([place])    canagliflozin (INVOKANA) 300 MG TABS tablet Take 1 tablet by mouth at bedtime.     clopidogrel (PLAVIX) 75 MG tablet Take 1 tablet (75 mg total) by mouth daily. Qty: 30 tablet, Refills: 6    diazepam (VALIUM) 5 MG tablet Take 1 tablet (5 mg total) by mouth every 6 (six) hours as needed for muscle spasms. Qty: 50 tablet, Refills: 1    gabapentin (NEURONTIN) 600 MG tablet Take 1 tablet by mouth two  times daily Qty: 180 tablet, Refills: 1    glimepiride (AMARYL) 4 MG tablet Take 4 mg by mouth 2 (two) times daily.     isosorbide mononitrate (IMDUR) 30 MG 24 hr tablet Take 1 tablet by mouth  daily Qty: 90 tablet, Refills: 3    losartan-hydrochlorothiazide (HYZAAR) 100-25 MG per tablet Take 1 tablet by mouth  daily Qty: 90 tablet, Refills: 3    magnesium oxide (MAG-OX) 400 (241.3 MG) MG tablet Take 1 tablet by mouth 2 (two) times daily.    metFORMIN (GLUCOPHAGE) 1000 MG tablet Take 1,000 mg by mouth at bedtime.     Multiple Vitamins-Minerals (CENTRUM SILVER ADULT 50+ PO) Take 1 tablet by mouth daily.    Multiple Vitamins-Minerals (ICAPS) CAPS Take 2 capsules by mouth daily.    nitroGLYCERIN (NITROSTAT) 0.4 MG SL tablet Place 1 tablet (0.4 mg total) under the tongue every 5 (five) minutes as needed for chest pain. Qty: 25 tablet, Refills: 3    omeprazole (PRILOSEC) 20 MG capsule Take 1 capsule (20 mg total) by mouth every morning. Qty: 90 capsule, Refills: 2    oxyCODONE-acetaminophen (PERCOCET/ROXICET) 5-325 MG per tablet Take 1-2 tablets by mouth every 6 (six) hours as needed for severe pain. Qty: 30 tablet, Refills: 0    potassium chloride (K-DUR) 10 MEQ tablet Take 3 tablets by mouth  daily as directed Qty: 270 tablet, Refills: 1    saxagliptin HCl (ONGLYZA) 5 MG TABS tablet Take 1 tablet (5 mg total) by mouth every morning. Qty: 90 tablet, Refills: 2    Tiotropium Bromide Monohydrate (SPIRIVA RESPIMAT) 2.5 MCG/ACT  AERS Inhale 1 puff into the lungs daily. Qty: 12 g, Refills: 3   Associated Diagnoses: Other emphysema ([place])      STOP taking these medications     amoxicillin-clavulanate (AUGMENTIN) 875-125 MG tablet      docusate sodium 100 MG CAPS          DISCHARGE INSTRUCTIONS:   DIET:  Cardiac diet and Diabetic diet  DISCHARGE CONDITION:  Stable  ACTIVITY:  Activity as tolerated  OXYGEN:  Home Oxygen: Yes.     Oxygen Delivery: 3 liters/min via Patient connected to nasal cannula oxygen  DISCHARGE LOCATION:  home   If you experience worsening of your admission symptoms, develop shortness of breath, life threatening emergency, suicidal or homicidal thoughts you must seek medical attention immediately by calling 911 or calling your MD immediately  if symptoms less severe.  You Must read complete instructions/literature along with all the possible adverse reactions/side effects for all the Medicines you take and that have been prescribed to you. Take any new Medicines after you have completely understood and accpet all the possible adverse reactions/side effects.   Please note  You were cared  for by a hospitalist during your hospital stay. If you have any questions about your discharge medications or the care you received while you were in the hospital after you are discharged, you can call the unit and asked to speak with the hospitalist on call if the hospitalist that took care of you is not available. Once you are discharged, your primary care physician will handle any further medical issues. Please note that NO REFILLS for any discharge medications will be authorized once you are discharged, as it is imperative that you return to your primary care physician (or establish a relationship with a primary care physician if you do not have one) for your aftercare needs so that they can reassess your need for medications and monitor your lab values.     Today   Patient denies any chest  pain, nausea, vomiting. No acute events overnight  VITAL SIGNS:  Blood pressure 124/49, pulse 80, temperature 98.8 F (37.1 C), temperature source Oral, resp. rate 22, height '5\' 4"'$  (1.626 m), weight 68.04 kg (150 lb), SpO2 94 %.  I/O:   Intake/Output Summary (Last 24 hours) at 01/29/15 1425 Last data filed at 01/29/15 1418  Gross per 24 hour  Intake 2619.45 ml  Output   2800 ml  Net -180.55 ml    PHYSICAL EXAMINATION:  GENERAL:  74 y.o.-year-old patient lying in the bed with no acute distress.  EYES: Pupils equal, round, reactive to light and accommodation. No scleral icterus. Extraocular muscles intact.  HEENT: Head atraumatic, normocephalic. Oropharynx and nasopharynx clear.  NECK:  Supple, no jugular venous distention. No thyroid enlargement, no tenderness.  LUNGS: Minimal end expiratory wheezing bilaterally. Good air entry bilaterally. No use of accessory muscles of respiration.  CARDIOVASCULAR: S1, S2 normal. No murmurs, rubs, or gallops.  ABDOMEN: Soft, non-tender, non-distended. Bowel sounds present. No organomegaly or mass.  EXTREMITIES: No pedal edema, cyanosis, or clubbing.  NEUROLOGIC: Cranial nerves II through XII are intact. No focal motor or sensory defecits b/l.  PSYCHIATRIC: The patient is alert and oriented x 3. Good affect.  SKIN: No obvious rash, lesion, or ulcer.   DATA REVIEW:   CBC  Recent Labs Lab 01/29/15 0553  WBC 8.9  HGB 12.4  HCT 36.8  PLT 167    Chemistries   Recent Labs Lab 01/29/15 0553  NA 140  K 4.4  CL 103  CO2 30  GLUCOSE 176*  BUN 18  CREATININE 0.81  CALCIUM 8.3*    Cardiac Enzymes  Recent Labs Lab 01/28/15 1253  TROPONINI 1.03*    Microbiology Results  Results for orders placed or performed during the hospital encounter of 01/27/15  MRSA PCR Screening     Status: None   Collection Time: 01/27/15 11:25 PM  Result Value Ref Range Status   MRSA by PCR NEGATIVE NEGATIVE Final    Comment:        The GeneXpert  MRSA Assay (FDA approved for NASAL specimens only), is one component of a comprehensive MRSA colonization surveillance program. It is not intended to diagnose MRSA infection nor to guide or monitor treatment for MRSA infections.     RADIOLOGY:  Dg Chest 2 View  01/27/2015  CLINICAL DATA:  Left chest pain.  Hyperglycemia. EXAM: CHEST  2 VIEW COMPARISON:  08/29/2014 FINDINGS: The heart size and mediastinal contours are within normal limits. Both lungs are clear. The visualized skeletal structures are unremarkable. IMPRESSION: Normal chest. Electronically Signed   By: Lorriane Shire M.D.   On: 01/27/2015  19:49   Nm Myocar Multi W/spect W/wall Motion / Ef  01/29/2015  Pharmacological myocardial perfusion imaging study with no significant  ischemia Normal wall motion, EF estimated at 61% No EKG changes concerning for ischemia. Low risk scan Signed, Esmond Plants, MD [facility]      Management plans discussed with the patient, family and they are in agreement.  CODE STATUS:     Code Status Orders        Start     Ordered   01/27/15 2148  Full code   Continuous     01/27/15 2147      TOTAL TIME TAKING CARE OF THIS PATIENT: 40 minutes.    Henreitta Leber M.D on 01/29/2015 at 2:25 PM  Between 7am to 6pm - Pager - [phone]  After 6pm go to [email or web address] - password [email or web address] [facility]  Office  [phone]  CC: Primary care physician; [name], MD

## 2015-01-29 NOTE — Consult Note (Signed)
   [facility] CM Inpatient Consult   01/29/2015  Annalyce Lanpher Pleitez 1940/05/16 [id number]  EPIC [facility] management referral received. Met with the patient at bedside to explain and offer [place] Management services . Patient agreeable to services and will receive post hospital discharge calls and will be evaluated for monthly home visits. Written consent signed. Encouraged patient to make an appointment with Dr Miguel Aschoff for close follow up. She is agreeable. Will request for patient to be assigned to community [facility] CM. I will update inpatient CM. Best contact phone numbers for patient are [phone] or [phone].   Of note, [facility] Care Management services does not replace or interfere with any services that are arranged by inpatient case management or social work. For additional questions or referrals please contact:   Joylene Draft, RN, [place] Management/Hospital Liaison [phone]- Mobile [phone]- [place]

## 2015-01-29 NOTE — Progress Notes (Signed)
Stress test was neg. A & o. Ambulated around the nurses station. No chest pain. IV and tele removed. Discharge instructions given to pt. Pt has no further concerns at this time.

## 2015-01-29 NOTE — Clinical Documentation Improvement (Signed)
  Hospitalist  Can the diagnosis of acute renal failure be further specified?   Acute Renal Failure/Acute Kidney Injury  Acute Tubular Necrosis  Acute on Chronic Renal Failure  Chronic Renal Failure (SPECIFY STAGE)  Other  Clinically Undetermined  Please update your documentation within the medical record to reflect your response to this query. Thank you  Supporting Information:(As per notes in the ED) "Acute kidney Injury" Component     Latest Ref Rng 01/27/2015 01/29/2015  Glucose     65 - 99 mg/dL 445 (H) 176 (H)  BUN     6 - 20 mg/dL 30 (H) 18  Creatinine     0.44 - 1.00 mg/dL 1.67 (H) 0.81  EGFR (Non-African Amer.)     >60 mL/min 30 (L) >60  EGFR (African American)     >60 mL/min 34 (L) >60  Anion gap     5 - $R'15 8 7    'KB$ Please exercise your independent, professional judgment when responding. A specific answer is not anticipated or expected.  Thank You, Alessandra Grout, RN, BSN, CCDS,Clinical Documentation Specialist:  [phone]  [phone]=Cell [place]- Health Information Management

## 2015-01-29 NOTE — Care Management Important Message (Signed)
Important Message  Patient Details  Name: [name] [name] MRN: [id number] Date of Birth: 1941/03/23   Medicare Important Message Given:  Yes-second notification given    [name] [name] 01/29/2015, 9:44 AM

## 2015-01-30 ENCOUNTER — Other Ambulatory Visit: Payer: Self-pay | Admitting: *Deleted

## 2015-01-30 ENCOUNTER — Encounter: Payer: Self-pay | Admitting: *Deleted

## 2015-01-30 NOTE — Patient Outreach (Signed)
Transition of care call (#1):  Spoke with pt, HIPPA verified.  Pt states on hospital admission, sugar was up to 600, dehydrated, told had small MI.  Pt states blood sugar this was was 164.   Pt states BP 108/51.  Pt states to see Dr. Rosanna Randy 11/1, see surgeon Dr. Arnoldo Morale 11/11.    Difficult hearing pt on the phone, would loose pt during conversation.  Agreed to call pt back [place] of care call.      Zara Chess.   [place] Care Management  [phone]

## 2015-01-30 NOTE — Patient Outreach (Addendum)
F/u on transition of care (#1) call:  Called pt back to complete transition of care call.   Pt reports no sob or chest pain.   Discussed with pt plan to f/u with weekly phone calls - 31 days from day of discharge.  Also, discussed doing a home visit to which pt agreed- scheduled for 11/1.    Plan to f/u with pt 11/1- home visit. Plan to inform Dr. Rosanna Randy of [facility] involvement.      Zara Chess.   [place] Care Management  [phone]

## 2015-02-05 ENCOUNTER — Encounter: Payer: Self-pay | Admitting: *Deleted

## 2015-02-05 ENCOUNTER — Other Ambulatory Visit: Payer: Self-pay | Admitting: *Deleted

## 2015-02-06 NOTE — Patient Outreach (Signed)
[facility]) Care Management   02/06/2015  [name] [name] July 23, 1940 [id number]  [name] [name] is an 74 y.o. female  Subjective:  Pt reports blood sugars up and down, this am- 164.   Pt reports her sugar coming into the hospital was 606.   Pt states to f/u with Dr. Rosanna Randy (post hospital visit) 11/3.   Pt states MD has been providing her with samples of Invokana and Onglyza for 4-5 years, could not affort to buy- currently in the donut hole.   Pt states she has been sticking to the Low Carb diet.    Objective:  Filed Vitals:   02/05/15 1150  BP: 94/60  Pulse: 79  Resp: 16   ROS  Physical Exam  Constitutional: She is oriented to person, place, and time. She appears well-developed and well-nourished.  Cardiovascular: Normal rate and regular rhythm.   Respiratory: Breath sounds normal.  GI: Soft.  Musculoskeletal: Normal range of motion.  Neurological: She is alert and oriented to person, place, and time.  Skin: Skin is warm and dry.  Psychiatric: She has a normal mood and affect. Her behavior is normal. Judgment and thought content normal.    Current Medications:  Reviewed with pt.  Current Outpatient Prescriptions  Medication Sig Dispense Refill  . aspirin 81 MG tablet Take 81 mg by mouth every morning.     Marland Kitchen atorvastatin (LIPITOR) 20 MG tablet Take 1 tablet by mouth at bedtime.    . budesonide-formoterol (SYMBICORT) 160-4.5 MCG/ACT inhaler Inhale 2 puffs into the lungs 2 (two) times daily. 1 Inhaler 0  . canagliflozin (INVOKANA) 300 MG TABS tablet Take 1 tablet by mouth at bedtime.     . clopidogrel (PLAVIX) 75 MG tablet Take 1 tablet (75 mg total) by mouth daily. 30 tablet 6  . Cyanocobalamin (VITAMIN B 12 PO) Take 1,000 mcg by mouth daily.    . diazepam (VALIUM) 5 MG tablet Take 1 tablet (5 mg total) by mouth every 6 (six) hours as needed for muscle spasms. 50 tablet 1  . gabapentin (NEURONTIN) 600 MG tablet Take 1 tablet by mouth two  times daily 180  tablet 1  . glimepiride (AMARYL) 4 MG tablet Take 4 mg by mouth 2 (two) times daily.     . isosorbide mononitrate (IMDUR) 30 MG 24 hr tablet Take 1 tablet by mouth  daily 90 tablet 3  . losartan-hydrochlorothiazide (HYZAAR) 100-25 MG per tablet Take 1 tablet by mouth  daily 90 tablet 3  . magnesium oxide (MAG-OX) 400 (241.3 MG) MG tablet Take 1 tablet by mouth 2 (two) times daily.    . metFORMIN (GLUCOPHAGE) 1000 MG tablet Take 1,000 mg by mouth at bedtime.     . Multiple Vitamins-Minerals (CENTRUM SILVER ADULT 50+ PO) Take 1 tablet by mouth daily.    . Multiple Vitamins-Minerals (EYE VITAMINS) CAPS Take 1 each by mouth daily.    . nitroGLYCERIN (NITROSTAT) 0.4 MG SL tablet Place 1 tablet (0.4 mg total) under the tongue every 5 (five) minutes as needed for chest pain. 25 tablet 3  . omeprazole (PRILOSEC) 20 MG capsule Take 1 capsule (20 mg total) by mouth every morning. 90 capsule 2  . oxyCODONE-acetaminophen (PERCOCET/ROXICET) 5-325 MG per tablet Take 1-2 tablets by mouth every 6 (six) hours as needed for severe pain. 30 tablet 0  . oxymetazoline (AFRIN) 0.05 % nasal spray Place 1 spray into both nostrils 2 (two) times daily.    . potassium chloride (K-DUR) 10 MEQ  tablet Take 3 tablets by mouth  daily as directed 270 tablet 1  . saxagliptin HCl (ONGLYZA) 5 MG TABS tablet Take 1 tablet (5 mg total) by mouth every morning. 90 tablet 2  . Tiotropium Bromide Monohydrate (SPIRIVA RESPIMAT) 2.5 MCG/ACT AERS Inhale 1 puff into the lungs daily. 12 g 3  . amitriptyline (ELAVIL) 10 MG tablet Take 0.5 tablets (5 mg total) by mouth at bedtime. (Patient not taking: Reported on 02/05/2015) 90 tablet 2  . Azelastine-Fluticasone (DYMISTA) 137-50 MCG/ACT SUSP Place 1 spray into the nose daily.    . Multiple Vitamins-Minerals (ICAPS) CAPS Take 2 capsules by mouth daily.     No current facility-administered medications for this visit.    Functional Status:   In your present state of health, do you have any  difficulty performing the following activities: 02/05/2015 01/27/2015  Hearing? - N  Vision? - N  Difficulty concentrating or making decisions? - N  Walking or climbing stairs? - N  Dressing or bathing? - N  Doing errands, shopping? - N  Conservation officer, nature and eating ? N -  Using the Toilet? N -  In the past six months, have you accidently leaked urine? Y -  Do you have problems with loss of bowel control? N -  Managing your Medications? N -  Managing your Finances? N -  Housekeeping or managing your Housekeeping? Y -    Fall/Depression Screening:    PHQ 2/9 Scores 02/05/2015  PHQ - 2 Score 1    Assessment:   DM-  Pt reports blood sugar this am 164, recheck one hour after eating- 235.   View of pt's glucometer- 7 day average- 254.   Ongoing compliance with low Carb diet needed.                            Low BP- pt's BP today 94/60, pt reports yesterday 101/51.  Pt to talk to MD about this at 11/3 MD visit.    Plan: Pt to f/u with Dr. Rosanna Randy 11/3- post hospital visit.            Pt to continue to check blood sugars 1-2 times daily, record, bring readings to MD office visit.            Pt to continue to check BP, record, bring readings to MD office visit, discuss low readings with MD.            RN CM to continue to follow pt for transition of care- plan to do f/u call 11/8.           Plan to send Dr. Rosanna Randy 11/1 encounter.    [facility] CM Care Plan Problem One        Most Recent Value   Care Plan Problem One  Risk for readmission related to recent hospitalization for high blood sugar, small MI    Role Documenting the Problem One  Care Management Coordinator   Care Plan for Problem One  Active   [facility] Long Term Goal (31-90 days)  Pt would not readmit 31 days from day of discharge    [place] Term Goal Start Date  01/30/15   Interventions for Problem One Long Term Goal  Weekly transition of care calls- 31 days post discharge, scheduled home  visit for next week.    [facility] CM Short Term Goal #1  (0-30 days)  Pt would continue to check blood sugars often, sugars  would be <200 in the next 30 days    [facility] CM Short Term Goal #1 Start Date  01/30/15   Interventions for Short Term Goal #1  Reinforced need for pt to continue to check sugars, compliance with Low Carb diet.    [facility] CM Short Term Goal #2 (0-30 days)  Pt's understanding of importance of ongoing diet compliance, controlling blood sugars in the next 30 days    [facility] CM Short Term Goal #2 Start Date  02/05/15   Interventions for Short Term Goal #2  Provided pt with diabetic information as well as reviewed= Emmi - Diabetes controlling blood sugar and What's on my plate.        Zara Chess.   [place] Care Management  [phone]

## 2015-02-07 ENCOUNTER — Ambulatory Visit (INDEPENDENT_AMBULATORY_CARE_PROVIDER_SITE_OTHER): Payer: Medicare Other | Admitting: Family Medicine

## 2015-02-07 ENCOUNTER — Encounter: Payer: Self-pay | Admitting: Family Medicine

## 2015-02-07 VITALS — BP 126/60 | HR 82 | Temp 98.1°F | Resp 18 | Wt 149.0 lb

## 2015-02-07 DIAGNOSIS — I214 Non-ST elevation (NSTEMI) myocardial infarction: Secondary | ICD-10-CM | POA: Diagnosis not present

## 2015-02-07 DIAGNOSIS — E1121 Type 2 diabetes mellitus with diabetic nephropathy: Secondary | ICD-10-CM

## 2015-02-07 DIAGNOSIS — Z09 Encounter for follow-up examination after completed treatment for conditions other than malignant neoplasm: Secondary | ICD-10-CM | POA: Diagnosis not present

## 2015-02-07 DIAGNOSIS — N289 Disorder of kidney and ureter, unspecified: Secondary | ICD-10-CM | POA: Diagnosis not present

## 2015-02-07 DIAGNOSIS — J441 Chronic obstructive pulmonary disease with (acute) exacerbation: Secondary | ICD-10-CM

## 2015-02-07 NOTE — Progress Notes (Signed)
Patient ID: [name] [name], female   DOB: 1940-09-16, 74 y.o.   MRN: [id number]    Subjective:  HPI  Hospital follow up: Patient was seen admitted at [facility] 10/23-10/25. SHe was having symptoms of blurry vision, dizziness and unsteadiness so her husband took her to the hospital. Diagnoses were: Non-st elevated MI.  Labs were done-Troponin was elevated -likely secondary to demand ischemia from hypoxia from underlying COPD. Echo and functional study were normal, ejection fraction was normal and no evidence of ischemia.  Patient states she still has symptoms at times and symptoms do not last long. She feels fine today. Her sugar was elevated at the hospital but patient has not changed her diet she does mention that she finished Prednisone 12 day taper 2 days prior the admission. She states her sugar while on Prednisone did not get over 250. Since hospital stay her sugar has been around 400-150.  Lab Results  Component Value Date   HGBA1C 8.4 01/01/2015    She felt depressed lately with bills piling up, been frustrated over her sugars. Depression screen [facility] 2/9 02/07/2015 02/05/2015  Decreased Interest 0 0  Down, Depressed, Hopeless 1 1  PHQ - 2 Score 1 1  Altered sleeping 0 -  Tired, decreased energy 1 -  Change in appetite 0 -  Feeling bad or failure about yourself  2 -  Trouble concentrating 0 -  Moving slowly or fidgety/restless 0 -  Suicidal thoughts 3 -  PHQ-9 Score 7 -     Prior to Admission medications   Medication Sig Start Date End Date Taking? Authorizing Provider  amitriptyline (ELAVIL) 10 MG tablet Take 0.5 tablets (5 mg total) by mouth at bedtime. Patient not taking: Reported on 02/05/2015 10/30/14   Jerrol Banana., MD  aspirin 81 MG tablet Take 81 mg by mouth every morning.     Historical Provider, MD  atorvastatin (LIPITOR) 20 MG tablet Take 1 tablet by mouth at bedtime. 07/22/14   Historical Provider, MD  Azelastine-Fluticasone (DYMISTA) 137-50 MCG/ACT SUSP Place  1 spray into the nose daily.    Historical Provider, MD  budesonide-formoterol (SYMBICORT) 160-4.5 MCG/ACT inhaler Inhale 2 puffs into the lungs 2 (two) times daily. 12/06/14   [name], MD  canagliflozin (INVOKANA) 300 MG TABS tablet Take 1 tablet by mouth at bedtime.  02/14/14   Historical Provider, MD  clopidogrel (PLAVIX) 75 MG tablet Take 1 tablet (75 mg total) by mouth daily. 10/25/14   Algernon Huxley, MD  Cyanocobalamin (VITAMIN B 12 PO) Take 1,000 mcg by mouth daily.    Historical Provider, MD  diazepam (VALIUM) 5 MG tablet Take 1 tablet (5 mg total) by mouth every 6 (six) hours as needed for muscle spasms. 10/20/13   Newman Pies, MD  gabapentin (NEURONTIN) 600 MG tablet Take 1 tablet by mouth two  times daily 01/11/15   Jerrol Banana., MD  glimepiride (AMARYL) 4 MG tablet Take 4 mg by mouth 2 (two) times daily.     Historical Provider, MD  isosorbide mononitrate (IMDUR) 30 MG 24 hr tablet Take 1 tablet by mouth  daily 07/23/14   [name], MD  losartan-hydrochlorothiazide [facility]) 100-25 MG per tablet Take 1 tablet by mouth  daily 11/12/14   Jerrol Banana., MD  magnesium oxide (MAG-OX) 400 (241.3 MG) MG tablet Take 1 tablet by mouth 2 (two) times daily. 08/04/13   Historical Provider, MD  metFORMIN (GLUCOPHAGE) 1000 MG tablet Take 1,000 mg by  mouth at bedtime.     Historical Provider, MD  Multiple Vitamins-Minerals (CENTRUM SILVER ADULT 50+ PO) Take 1 tablet by mouth daily. 05/15/10   Historical Provider, MD  Multiple Vitamins-Minerals (EYE VITAMINS) CAPS Take 1 each by mouth daily.    Historical Provider, MD  Multiple Vitamins-Minerals (ICAPS) CAPS Take 2 capsules by mouth daily.    Historical Provider, MD  nitroGLYCERIN (NITROSTAT) 0.4 MG SL tablet Place 1 tablet (0.4 mg total) under the tongue every 5 (five) minutes as needed for chest pain. 07/23/14   [name], MD  omeprazole (PRILOSEC) 20 MG capsule Take 1 capsule (20 mg total) by mouth every morning. 10/30/14    Richard Maceo Pro., MD  oxyCODONE-acetaminophen (PERCOCET/ROXICET) 5-325 MG per tablet Take 1-2 tablets by mouth every 6 (six) hours as needed for severe pain. 10/25/14   Algernon Huxley, MD  oxymetazoline (AFRIN) 0.05 % nasal spray Place 1 spray into both nostrils 2 (two) times daily.    Historical Provider, MD  potassium chloride (K-DUR) 10 MEQ tablet Take 3 tablets by mouth  daily as directed 01/11/15   Jerrol Banana., MD  saxagliptin HCl (ONGLYZA) 5 MG TABS tablet Take 1 tablet (5 mg total) by mouth every morning. 10/30/14   Richard Maceo Pro., MD  Tiotropium Bromide Monohydrate (SPIRIVA RESPIMAT) 2.5 MCG/ACT AERS Inhale 1 puff into the lungs daily. 12/06/14   [name], MD    Patient Active Problem List   Diagnosis Date Noted  . NSTEMI (non-ST elevated myocardial infarction) ([place]) 01/27/2015  . Chronic vulvitis 09/26/2014  . Allergic reaction 09/26/2014  . Carotid stenosis 08/30/2014  . Cervical nerve root disorder 08/10/2014  . Absolute anemia 08/10/2014  . CAD in native artery 08/10/2014  . B12 deficiency 08/10/2014  . Back ache 08/10/2014  . Bronchitis, chronic ([place]) 08/10/2014  . Diabetes mellitus with polyneuropathy ([place]) 08/10/2014  . Can't get food down 08/10/2014  . Eczema of external ear 08/10/2014  . Accumulation of fluid in tissues 08/10/2014  . Gout 08/10/2014  . Adult hypothyroidism 08/10/2014  . Mononeuritis 08/10/2014  . Muscle ache 08/10/2014  . Disorder of peripheral nervous system ([place]) 08/10/2014  . Lesion of vulva 08/10/2014  . Cervical spondylosis with radiculopathy 10/19/2013  . Unstable angina ([place]) 08/15/2013  . COPD exacerbation ([place]) 03/29/2013  . CAD (coronary artery disease) 06/22/2011  . COPD (chronic obstructive pulmonary disease) with emphysema ([place]) 03/07/2010  . CHEST PAIN UNSPECIFIED 07/22/2009  . HLD (hyperlipidemia) 04/01/2009  . Malaise and fatigue 04/01/2009  . HYPERLIPIDEMIA-MIXED 01/18/2009  . TOBACCO ABUSE 01/18/2009  .  HYPERTENSION, BENIGN 01/18/2009  . CLAUDICATION 01/18/2009  . Pain in limb 01/18/2009  . CAFL (chronic airflow limitation) ([place]) 01/20/2007  . Late effects of cerebrovascular disease 01/10/2007  . Essential (primary) hypertension 12/22/2006    Past Medical History  Diagnosis Date  . Coronary artery disease, non-occlusive     a. cath 2006: min nonobs CAD; b. cath 12/2010: cath LAD 50%, RCA 60%; c. 08/2013: Minimal luminal irregs, right dominant system with no significant CAD, diffuse luminal irregs noted. Normal EF 55%, no AS or MS.   Marland Kitchen Hypertension   . Diabetes mellitus   . Hyperlipemia     Followed by Dr. Rosanna Randy  . Stroke ([place])   . COPD (chronic obstructive pulmonary disease) ([place])   . Shortness of breath   . Pneumonia     hx  . Macular degeneration     rt  . COPD (chronic obstructive pulmonary  disease) [facility])     Social History   Social History  . Marital Status: Married    Spouse Name: N/A  . Number of Children: N/A  . Years of Education: N/A   Occupational History  . Retired, Environmental consultant    Social History Main Topics  . Smoking status: Current Every Day Smoker -- 0.25 packs/day for 54 years    Types: Cigarettes  . Smokeless tobacco: Never Used     Comment: smokes 3 cigs daily 09/07/14. Pt instructed to quit.  . Alcohol Use: No  . Drug Use: No  . Sexual Activity: No   Other Topics Concern  . Not on file   Social History Narrative   Married with 4 children   Gets regular exercise    Allergies  Allergen Reactions  . Coconut Fatty Acids Swelling    Throat swells    Review of Systems  Constitutional: Negative.   Respiratory: Positive for cough, sputum production and shortness of breath.   Cardiovascular: Negative.   Musculoskeletal: Negative.   Psychiatric/Behavioral: Positive for depression. The patient is not nervous/anxious and does not have insomnia.     Immunization History  Administered Date(s) Administered  . Influenza Split 02/29/2012  .  Influenza Whole 12/05/2009, 12/10/2010  . Influenza, High Dose Seasonal PF 01/01/2015  . Influenza,inj,Quad PF,36+ Mos 01/04/2013  . Influenza-Unspecified 02/04/2014  . Pneumococcal Conjugate-13 02/29/2012  . Pneumococcal Polysaccharide-23 01/05/2008  . Tdap 01/05/2012   Objective:  BP 126/60 mmHg  Pulse 82  Temp(Src) 98.1 F (36.7 C)  Resp 18  Wt 149 lb (67.586 kg)  SpO2 95%  Physical Exam  Constitutional: She is oriented to person, place, and time and well-developed, well-nourished, and in no distress.  HENT:  Head: Normocephalic.  Eyes: Conjunctivae are normal. Pupils are equal, round, and reactive to light.  Cardiovascular: Normal rate, regular rhythm, normal heart sounds and intact distal pulses.   No murmur heard. Pulmonary/Chest: Effort normal and breath sounds normal. No respiratory distress. She has no wheezes.  Musculoskeletal: Normal range of motion. She exhibits no edema or tenderness.  Neurological: She is alert and oriented to person, place, and time.  Psychiatric: Mood, memory, affect and judgment normal.    Lab Results  Component Value Date   WBC 8.9 01/29/2015   HGB 12.4 01/29/2015   HCT 36.8 01/29/2015   PLT 167 01/29/2015   GLUCOSE 176* 01/29/2015   CHOL 195 08/30/2014   TRIG 197* 08/30/2014   HDL 47 08/30/2014   LDLCALC 109* 08/30/2014   INR 0.89 01/27/2015   HGBA1C 8.4 01/01/2015    CMP     Component Value Date/Time   NA 140 01/29/2015 0553   NA 139 08/15/2013 1459   K 4.4 01/29/2015 0553   K 3.9 12/16/2011 1232   CL 103 01/29/2015 0553   CO2 30 01/29/2015 0553   GLUCOSE 176* 01/29/2015 0553   GLUCOSE 329* 08/15/2013 1459   BUN 18 01/29/2015 0553   BUN 13 08/15/2013 1459   CREATININE 0.81 01/29/2015 0553   CALCIUM 8.3* 01/29/2015 0553   PROT 6.7 08/02/2008 2232   ALBUMIN 4.2 08/02/2008 2232   AST 10 08/02/2008 2232   ALT 10 08/02/2008 2232   ALKPHOS 77 08/02/2008 2232   BILITOT 0.3 08/02/2008 2232   GFRNONAA >60 01/29/2015 0553    GFRAA >60 01/29/2015 0553    Assessment and Plan :  1. Hospital discharge follow-up Reviewed records.  2. Type 2 diabetes mellitus with diabetic nephropathy, without long-term current  use of insulin ([place]) Will follow. Continue current medication. Samples of Invokana and Onglyza provided.  3. Non-ST elevated myocardial infarction ([place]) Follow. Patient is doing better. Probable demand ischemia. 4. COPD exacerbation ([place]) Advised patient not to start smoking again-she had a slip up since last visit smoking.  5. Depression  Patient states she feels better now. Will follow for now.  6.Tobacco Abuse Pt strongly advised to quit completely. I have done the exam and reviewed the above chart and it is accurate to the best of my knowledge.  Miguel Aschoff MD [facility] 02/07/2015 2:53 PM

## 2015-02-08 LAB — RENAL FUNCTION PANEL
Albumin: 4.3 g/dL (ref 3.5–4.8)
BUN/Creatinine Ratio: 14 (ref 11–26)
BUN: 15 mg/dL (ref 8–27)
CALCIUM: 9.3 mg/dL (ref 8.7–10.3)
CO2: 25 mmol/L (ref 18–29)
Chloride: 95 mmol/L — ABNORMAL LOW (ref 97–106)
Creatinine, Ser: 1.05 mg/dL — ABNORMAL HIGH (ref 0.57–1.00)
GFR calc non Af Amer: 53 mL/min/{1.73_m2} — ABNORMAL LOW (ref >59)
GFR, EST AFRICAN AMERICAN: 61 mL/min/{1.73_m2} (ref >59)
GLUCOSE: 186 mg/dL — AB (ref 65–99)
Phosphorus: 3.4 mg/dL (ref 2.5–4.5)
Potassium: 4.6 mmol/L (ref 3.5–5.2)
Sodium: 139 mmol/L (ref 136–144)

## 2015-02-12 ENCOUNTER — Other Ambulatory Visit: Payer: Self-pay | Admitting: *Deleted

## 2015-02-12 DIAGNOSIS — Z6825 Body mass index (BMI) 25.0-25.9, adult: Secondary | ICD-10-CM | POA: Diagnosis not present

## 2015-02-12 DIAGNOSIS — S129XXS Fracture of neck, unspecified, sequela: Secondary | ICD-10-CM | POA: Diagnosis not present

## 2015-02-12 DIAGNOSIS — M542 Cervicalgia: Secondary | ICD-10-CM | POA: Diagnosis not present

## 2015-02-12 NOTE — Patient Outreach (Signed)
Transition of care call (week 3):  Called pt's home, person answering the phone  states pt is not here right now.  RN CM to try again later.      Zara Chess.   [place] [facility]  [phone]

## 2015-02-12 NOTE — Patient Outreach (Signed)
Transition of care call (week 3):  Pt reports doing good, f/u with Dr. Rosanna Randy told kidneys are stable.  Pt reports dizziness is better, blood sugars 200-250.  Pt states needs more information on carbohydrates, how much to have at each meal, requested RN CM send information to her e mail - [email or web address].   As discussed, with pt plan to f/u again telephonically 11/5 as part of ongoing transition of care.     Plan to send pt information on Carbohydrate intake to her e mail as requested.    Zara Chess.   [place] [facility]  [phone]

## 2015-02-13 ENCOUNTER — Other Ambulatory Visit: Payer: Self-pay | Admitting: *Deleted

## 2015-02-13 NOTE — Patient Outreach (Signed)
Attempt made to contact pt to see if she received Emmi information sent by RN CM today-  (Diabetes- counting carbohydrates)- one media and one article.    HIPPA compliant voice message left with contact number.        Zara Chess.   [place] [facility]  [phone]

## 2015-02-15 ENCOUNTER — Other Ambulatory Visit: Payer: Self-pay | Admitting: *Deleted

## 2015-02-15 NOTE — Patient Outreach (Signed)
Another attempt made to contact pt to see if she received requested Emmi information that was emailed to her 11/9.   Plan to f/u with pt again telephonically on 11/15 as part of ongoing transition of care.      Zara Chess.   [place] [facility]  [phone]

## 2015-02-19 ENCOUNTER — Other Ambulatory Visit: Payer: Self-pay | Admitting: *Deleted

## 2015-02-19 NOTE — Patient Outreach (Signed)
Transition of care call (week 4):  Pt reports blood sugars doing better, staying 200, one day was 165.  Pt reports watching her carbohydrates, eating salads, protein with her meals.  Pt states f/u with Dr. Arnoldo Morale (neck surgery done), good report.  RN CM inquired about the   Emmi information on carbohydrate counting sent to her email to which pt states did not receive.   As discussed, RN CM plans to resend Emmi (DM- carbohydrate counting) to pt's e mail.     Pt to call RN CM back- confirm received Emmi information.  Plan to f/u again telephonically 11/22 as part of ongoing transition of care.     Zara Chess.   [place] [facility]  [phone]

## 2015-02-26 ENCOUNTER — Other Ambulatory Visit: Payer: Self-pay | Admitting: *Deleted

## 2015-02-26 DIAGNOSIS — J449 Chronic obstructive pulmonary disease, unspecified: Secondary | ICD-10-CM | POA: Diagnosis not present

## 2015-02-26 NOTE — Patient Outreach (Signed)
Transition of care call:  Pt reports doing good, sugars staying under  200, today 169.  Pt states she did receive email  (Emmi information sent) on Carbohydrate counting, printed it out.   Pt states information helps.   Pt reports next MD f/u is in January.    Discussed with pt need to do a  final transition of care call on 11/25, that coworker covering for this RN CM will be  doing the call.    Also, discussed with pt  f/u again next month, schedule home visit, plan to provide community nurse case management services to which pt agreed.    Zara Chess.   [place] [facility]  [phone]

## 2015-03-04 ENCOUNTER — Other Ambulatory Visit: Payer: Self-pay | Admitting: *Deleted

## 2015-03-04 ENCOUNTER — Telehealth: Payer: Self-pay | Admitting: Internal Medicine

## 2015-03-04 NOTE — Telephone Encounter (Signed)
Per Phillips Grout, Arbie Cookey with [employer] is going to call pt and then call Rhonda back. Will await call.

## 2015-03-04 NOTE — Telephone Encounter (Signed)
Spoke with pt in regards to a travel tank. Informed pt that she needs to contact [place] travel dept and they will get her setup. Gave pt number to contact them. Nothing further needed.

## 2015-03-04 NOTE — Telephone Encounter (Signed)
I called spoke with pt. She is going out of town 12/4-12/8. She does not want to take her big concentrator and is requesting an order be sent to get her a portable tank to use at night. She uses 3 liters QHS only. Please advise Dr. Mortimer Fries thanks

## 2015-03-04 NOTE — Telephone Encounter (Signed)
Patient says she called for portable tank to take on vacation.   Leaving for vacation on 12/4.   DME: [facility]

## 2015-03-04 NOTE — Telephone Encounter (Signed)
Per Arbie Cookey at [place], they are loaning the pt 2 small concentrators and e-tanks to use while on vacation. Pt aware. Nothing further needed.

## 2015-03-04 NOTE — Patient Outreach (Signed)
Final transition of care call:  Pt reports blood sugars not good with the holidays- 300.  Pt reports today down to 200, pleased with that.  Pt reports still counting carbohydrates, back on diet.   As discussed with pt, plan to do a home visit 12/2- provide community nurse case management services (self management of diabetes).    Zara Chess.   [place] [facility]  [phone]

## 2015-03-06 DIAGNOSIS — H353290 Exudative age-related macular degeneration, unspecified eye, stage unspecified: Secondary | ICD-10-CM | POA: Diagnosis not present

## 2015-03-06 DIAGNOSIS — H353120 Nonexudative age-related macular degeneration, left eye, stage unspecified: Secondary | ICD-10-CM | POA: Diagnosis not present

## 2015-03-08 ENCOUNTER — Other Ambulatory Visit: Payer: Self-pay | Admitting: *Deleted

## 2015-03-08 NOTE — Patient Outreach (Signed)
[facility]) Care Management   03/08/2015  MARTINE BLEECKER 07-29-1940 [id number]  [name] [name] is an 74 y.o. female  Subjective:  Pt reports she did receive her portable O2  Tanks yesterday, plan to take on vacation, leaving 12/4.  Pt states sugar this am was 190, weights staying 146-150 lbs.   Pt reports coughing more, waking her up and when she does cough up secretions- clear to white.  Pt reports  no change in sob- with exertion.    Objective:  O2 sat 95% on room air.  HR 73.  Filed Vitals:   03/08/15 1059 03/08/15 1103  BP: 90/50 94/50  Pulse:    Resp:      ROS  Physical Exam  Constitutional: She is oriented to person, place, and time. She appears well-developed and well-nourished.  Cardiovascular: Normal rate and regular rhythm.   Respiratory:  Slight fine crackles  noted on inspiration - Left lower lobe posteriorly.   GI: Soft. Bowel sounds are normal.  Musculoskeletal: Normal range of motion.  Neurological: She is alert and oriented to person, place, and time.  Skin: Skin is warm and dry.  Psychiatric: She has a normal mood and affect. Her behavior is normal. Judgment and thought content normal.    Current Medications:  Reviewed with pt  Current Outpatient Prescriptions  Medication Sig Dispense Refill  . amitriptyline (ELAVIL) 10 MG tablet Take 0.5 tablets (5 mg total) by mouth at bedtime. 90 tablet 2  . aspirin 81 MG tablet Take 81 mg by mouth every morning.     Marland Kitchen atorvastatin (LIPITOR) 20 MG tablet Take 1 tablet by mouth at bedtime.    . Azelastine-Fluticasone (DYMISTA) 137-50 MCG/ACT SUSP Place 1 spray into the nose daily.    . budesonide-formoterol (SYMBICORT) 160-4.5 MCG/ACT inhaler Inhale 2 puffs into the lungs 2 (two) times daily. 1 Inhaler 0  . canagliflozin (INVOKANA) 300 MG TABS tablet Take 1 tablet by mouth at bedtime.     . clopidogrel (PLAVIX) 75 MG tablet Take 1 tablet (75 mg total) by mouth daily. 30 tablet 6  . Cyanocobalamin (VITAMIN  B 12 PO) Take 1,000 mcg by mouth daily.    . diazepam (VALIUM) 5 MG tablet Take 1 tablet (5 mg total) by mouth every 6 (six) hours as needed for muscle spasms. 50 tablet 1  . gabapentin (NEURONTIN) 600 MG tablet Take 1 tablet by mouth two  times daily 180 tablet 1  . glimepiride (AMARYL) 4 MG tablet Take 4 mg by mouth 2 (two) times daily.     . isosorbide mononitrate (IMDUR) 30 MG 24 hr tablet Take 1 tablet by mouth  daily 90 tablet 3  . losartan-hydrochlorothiazide (HYZAAR) 100-25 MG per tablet Take 1 tablet by mouth  daily 90 tablet 3  . magnesium oxide (MAG-OX) 400 (241.3 MG) MG tablet Take 1 tablet by mouth 2 (two) times daily.    . metFORMIN (GLUCOPHAGE) 1000 MG tablet Take 1,000 mg by mouth at bedtime.     . Multiple Vitamins-Minerals (EYE VITAMINS) CAPS Take 1 each by mouth daily.    . Multiple Vitamins-Minerals (ICAPS) CAPS Take 2 capsules by mouth daily.    . naproxen sodium (ANAPROX) 220 MG tablet Take 220 mg by mouth as needed. Takes twice a week as needed.    . nitroGLYCERIN (NITROSTAT) 0.4 MG SL tablet Place 1 tablet (0.4 mg total) under the tongue every 5 (five) minutes as needed for chest pain. 25 tablet 3  .  omeprazole (PRILOSEC) 20 MG capsule Take 1 capsule (20 mg total) by mouth every morning. 90 capsule 2  . oxymetazoline (AFRIN) 0.05 % nasal spray Place 1 spray into both nostrils 2 (two) times daily.    . potassium chloride (K-DUR) 10 MEQ tablet Take 3 tablets by mouth  daily as directed 270 tablet 1  . saxagliptin HCl (ONGLYZA) 5 MG TABS tablet Take 1 tablet (5 mg total) by mouth every morning. 90 tablet 2  . Tiotropium Bromide Monohydrate (SPIRIVA RESPIMAT) 2.5 MCG/ACT AERS Inhale 1 puff into the lungs daily. 12 g 3  . Multiple Vitamins-Minerals (CENTRUM SILVER ADULT 50+ PO) Take 1 tablet by mouth daily.    Marland Kitchen oxyCODONE-acetaminophen (PERCOCET/ROXICET) 5-325 MG per tablet Take 1-2 tablets by mouth every 6 (six) hours as needed for severe pain. (Patient not taking: Reported on  03/08/2015) 30 tablet 0   No current facility-administered medications for this visit.    Functional Status:   In your present state of health, do you have any difficulty performing the following activities: 03/08/2015 02/05/2015  Hearing? N -  Vision? - -  Difficulty concentrating or making decisions? N -  Walking or climbing stairs? N -  Dressing or bathing? N -  Doing errands, shopping? N -  Conservation officer, nature and eating ? - N  Using the Toilet? - N  In the past six months, have you accidently leaked urine? - Y  Do you have problems with loss of bowel control? - N  Managing your Medications? - N  Managing your Finances? - N  Housekeeping or managing your Housekeeping? - Y    Fall/Depression Screening:    PHQ 2/9 Scores 02/07/2015 02/05/2015  PHQ - 2 Score 1 1  PHQ- 9 Score 7 -    Assessment:  COPD- fine crackles noted on inspiration in Left lower lobe posteriorly.   Pt reports coughing more- mostly nonproductive, no fever.                            DM- sugar today 190.  View of pt's glucometer results- 7 and 14 day average lower than last month, 30 day higher.                           Low BP- today 90/50 on both arms, recheck in left arm after drinking water- 94/50.   Pt to stay hydrated, monitor BP more often.  Plan:  COPD= pt to continue to take respiratory medications as ordered, use of O2 at night/as needed during the day, use precautions while on vacation (hand washing, avoiding sick people).            DM= pt to continue to check sugars often, record, monitor carbohydrates (2-3 per meal), watch portion sizes.            Low BP=  Pt to monitor BP more often, record, stay hydrated.               Pt to f/u with Dr. Rosanna Randy  04/17/15.            RN CM to continue to provide ongoing community nurse case management services, next home visit 04/18/15.     THN CM Care Plan Problem One        Most Recent Value   Care Plan Problem One  Diabetes- elevated blood sugars    Role  Documenting the Problem One  Care Management Coordinator   Care Plan for Problem One  Active   THN Long Term Goal (31-90 days)  Pt's A1c would be down one point within next 45 days    THN Long Term Goal Start Date  03/08/15   Interventions for Problem One Long Term Goal  Reviewed with pt previous Emmi informatio emailed to pt- Carbohydrate counting, small food portions    THN CM Short Term Goal #1 (0-30 days)  Pt's blood sugars would be under 200  in the next 30 days    THN CM Short Term Goal #1 Start Date  03/08/15   Interventions for Short Term Goal #1  Discussed with pt limiting carbohydrates to 2-3 per meal, portion size, ongoing exercise.     [facility] CM Care Plan Problem Two        Most Recent Value   Care Plan Problem Two  COPD-  hx of bronchitis, pneumonia in winter months    Role Documenting the Problem Two  Care Management [place] for Problem Two  Active   THN CM Short Term Goal #1 (0-30 days)  pt would be free of respiratory illnesses in the next 45 days    THN CM Short Term Goal #1 Start Date  03/08/15   Interventions for Short Term Goal #2   Discussed with pt s/s of respiratory infection to report to MD, continue with respiratory medications, importance of hydration.     THN CM Care Plan Problem Three        Most Recent Value   Care Plan Problem Three  Low BP    Role Documenting the Problem Three  Care Management Coordinator   Care Plan for Problem Three  Active   THN Long Term Goal (31-90) days  Pt's BP would be in normal limits within next 45 days    THN Long Term Goal Start Date  03/08/15   Interventions for Problem Three Long Term Goal  Discussed with pt checking BP more often,recording, importance of hydration.         Zara Chess.   [place] [facility]  [phone]

## 2015-03-28 DIAGNOSIS — J449 Chronic obstructive pulmonary disease, unspecified: Secondary | ICD-10-CM | POA: Diagnosis not present

## 2015-04-16 ENCOUNTER — Telehealth: Payer: Self-pay | Admitting: Family Medicine

## 2015-04-16 NOTE — Telephone Encounter (Signed)
Pt wants to know if we have samples invacona? '300mg'$ ,  and onglyza '5mg'$ .  Please call her back and let her know.  She cant get out right now because of the weather but will send her husband if we have samples.  Call back is [phone]  Thanks Con Memos

## 2015-04-16 NOTE — Telephone Encounter (Signed)
Samples placed up front-aa

## 2015-04-17 ENCOUNTER — Ambulatory Visit: Payer: Medicare Other | Admitting: Family Medicine

## 2015-04-17 ENCOUNTER — Other Ambulatory Visit: Payer: Self-pay | Admitting: *Deleted

## 2015-04-18 ENCOUNTER — Other Ambulatory Visit: Payer: Self-pay | Admitting: Family Medicine

## 2015-04-18 ENCOUNTER — Ambulatory Visit: Payer: Self-pay | Admitting: *Deleted

## 2015-04-18 ENCOUNTER — Encounter: Payer: Self-pay | Admitting: *Deleted

## 2015-04-18 NOTE — Patient Outreach (Signed)
[facility]) Care Management   04/17/15 home visit   [name] [name] May 07, 1940 [id number]  [name] [name] is an 75 y.o. female  Subjective:  Pt reports she cancelled her appointment with Dr. Rosanna Randy yesterday because of the snow, to f/u 1/30.   Pt reports her blood sugars have been lousy, ran out of her 2 diabetic medications for 2 weeks, was out of town, death in family, not planning to stay that long.   Pt reports she started taking OTC cinnamon pills and sugars went from 200-400 down to 200, 254 today.  Pt reports her husband picked up samples (2 diabetic medications) from MD office yesterday, started back last night.   Pt reports she caught the croup (common cold), continues to use her O 2 at night.     Objective:   Filed Vitals:   04/17/15 1452  BP: 92/72  Pulse: 75  Resp: 20     ROS  Physical Exam  Constitutional: She is oriented to person, place, and time. She appears well-developed and well-nourished.  Cardiovascular: Normal rate and regular rhythm.   Respiratory: Effort normal and breath sounds normal.  GI: Soft.  Musculoskeletal: Normal range of motion.  Neurological: She is alert and oriented to person, place, and time.  Skin: Skin is warm and dry.  Psychiatric: She has a normal mood and affect. Her behavior is normal. Judgment and thought content normal.    Current Medications:  Reviewed with pt  Current Outpatient Prescriptions  Medication Sig Dispense Refill  . albuterol (PROVENTIL) (5 MG/ML) 0.5% nebulizer solution Take 2.5 mg by nebulization 2 (two) times daily.    Marland Kitchen amitriptyline (ELAVIL) 10 MG tablet Take 0.5 tablets (5 mg total) by mouth at bedtime. 90 tablet 2  . aspirin 81 MG tablet Take 81 mg by mouth every morning.     Marland Kitchen atorvastatin (LIPITOR) 20 MG tablet Take 1 tablet by mouth at bedtime.    . Azelastine-Fluticasone (DYMISTA) 137-50 MCG/ACT SUSP Place 1 spray into the nose daily.    . budesonide-formoterol (SYMBICORT) 160-4.5 MCG/ACT  inhaler Inhale 2 puffs into the lungs 2 (two) times daily. 1 Inhaler 0  . clopidogrel (PLAVIX) 75 MG tablet Take 1 tablet (75 mg total) by mouth daily. 30 tablet 6  . Cyanocobalamin (VITAMIN B 12 PO) Take 1,000 mcg by mouth daily.    . diazepam (VALIUM) 5 MG tablet Take 1 tablet (5 mg total) by mouth every 6 (six) hours as needed for muscle spasms. 50 tablet 1  . gabapentin (NEURONTIN) 600 MG tablet Take 1 tablet by mouth two  times daily 180 tablet 1  . isosorbide mononitrate (IMDUR) 30 MG 24 hr tablet Take 1 tablet by mouth  daily 90 tablet 3  . losartan-hydrochlorothiazide (HYZAAR) 100-25 MG per tablet Take 1 tablet by mouth  daily 90 tablet 3  . magnesium oxide (MAG-OX) 400 (241.3 MG) MG tablet Take 1 tablet by mouth 2 (two) times daily.    . metFORMIN (GLUCOPHAGE) 1000 MG tablet Take 1,000 mg by mouth at bedtime.     . Multiple Vitamins-Minerals (CENTRUM SILVER ADULT 50+ PO) Take 1 tablet by mouth daily.    . Multiple Vitamins-Minerals (EYE VITAMINS) CAPS Take 1 each by mouth daily.    . naproxen sodium (ANAPROX) 220 MG tablet Take 220 mg by mouth as needed. Takes twice a week as needed.    . nitroGLYCERIN (NITROSTAT) 0.4 MG SL tablet Place 1 tablet (0.4 mg total) under the tongue every  5 (five) minutes as needed for chest pain. 25 tablet 3  . omeprazole (PRILOSEC) 20 MG capsule Take 1 capsule (20 mg total) by mouth every morning. 90 capsule 2  . oxymetazoline (AFRIN) 0.05 % nasal spray Place 1 spray into both nostrils 2 (two) times daily.    . potassium chloride (K-DUR) 10 MEQ tablet Take 3 tablets by mouth  daily as directed 270 tablet 1  . saxagliptin HCl (ONGLYZA) 5 MG TABS tablet Take 1 tablet (5 mg total) by mouth every morning. 90 tablet 2  . Tiotropium Bromide Monohydrate (SPIRIVA RESPIMAT) 2.5 MCG/ACT AERS Inhale 1 puff into the lungs daily. 12 g 3  . glimepiride (AMARYL) 4 MG tablet Take 1 tablet by mouth two  times daily 180 tablet 3  . INVOKANA 300 MG TABS tablet Take 1 tablet by  mouth  daily 90 tablet 3  . Multiple Vitamins-Minerals (ICAPS) CAPS Take 2 capsules by mouth daily. Reported on 04/17/2015    . oxyCODONE-acetaminophen (PERCOCET/ROXICET) 5-325 MG per tablet Take 1-2 tablets by mouth every 6 (six) hours as needed for severe pain. (Patient not taking: Reported on 03/08/2015) 30 tablet 0   No current facility-administered medications for this visit.    Functional Status:   In your present state of health, do you have any difficulty performing the following activities: 03/08/2015 02/05/2015  Hearing? N -  Vision? - -  Difficulty concentrating or making decisions? N -  Walking or climbing stairs? N -  Dressing or bathing? N -  Doing errands, shopping? N -  Conservation officer, nature and eating ? - N  Using the Toilet? - N  In the past six months, have you accidently leaked urine? - Y  Do you have problems with loss of bowel control? - N  Managing your Medications? - N  Managing your Finances? - N  Housekeeping or managing your Housekeeping? - Y    Fall/Depression Screening:    PHQ 2/9 Scores 02/07/2015 02/05/2015  PHQ - 2 Score 1 1  PHQ- 9 Score 7 -    Assessment:  DM- pt reports sugar today 254.   Pt started back on 2 oral diabetic medications yesterday after being                           Off 2 weeks.                         COPD- stable, pt dealing with a common cold.                          HTN- on the low side today 92/72.    View of pt's BP readings- 122/62, 129/68, 135/73, 141/71.                                                   Plan:   Pt to continue to check sugars/record/bring to MD office visit.             Pt to continue to take medications as ordered, call MD office early when getting low on samples of 2 diabetic medications.              With low BP reading, dealing with common cold- pt to stay  hydrated.             Plan to continue to provide pt with  community nurse case management services, next home visit 2/16.             Plan to send  Dr.  Rosanna Randy via in basket  in [place] quarterly update (route encounter, send letter).    [facility] CM Care Plan Problem One        Most Recent Value   Care Plan Problem One  Diabetes- elevated blood sugars    Role Documenting the Problem One  Care Management Coordinator   Care Plan for Problem One  Active   [facility] Long Term Goal (31-90 days)  Pt's A1c would be down one point within next 45 days    [facility] Long Term Goal Start Date  04/17/15   Interventions for Problem One Long Term Goal  Reviewed with pt previous Emmi informatio emailed to pt- Carbohydrate counting, small food portions    [facility] CM Short Term Goal #1 (0-30 days)  Pt's blood sugars would be under 200  in the next 30 days    [facility] CM Short Term Goal #1 Start Date  03/08/15   [facility] CM Short Term Goal #1 Met Date  -- [not met- pt ran out of 2 diabetic medications ]   Interventions for Short Term Goal #1  Discussed with pt limiting carbohydrates to 2-3 per meal, portion size, ongoing exercise.    [facility] CM Short Term Goal #2 (0-30 days)  Pt's sugars would be under 250 in the next 30 days    [facility] CM Short Term Goal #2 Start Date  04/17/15   Interventions for Short Term Goal #2  Discussed with pt ongoing complinace with oral diabetic medications, call MD ahead of time for samples to avoid running out.     [facility] CM Care Plan Problem Two        Most Recent Value   Care Plan Problem Two  COPD-  hx of bronchitis, pneumonia in winter months    Role Documenting the Problem Two  Care Management [place] for Problem Two  Active   [facility] CM Short Term Goal #1 (0-30 days)  pt would be free of respiratory illnesses in the next 45 days    [facility] CM Short Term Goal #1 Start Date  03/08/15   [facility] CM Short Term Goal #1 Met Date   -- [not met- pt had crud 2 weeks. ]   Interventions for Short Term Goal #2   Discussed with pt s/s of respiratory infection to report to MD, continue with respiratory medications, importance of hydration.     [facility] CM Care Plan Problem Three         Most Recent Value   Care Plan Problem Three  Low BP    Role Documenting the Problem Three  Care Management Coordinator   Care Plan for Problem Three  Active   THN Long Term Goal (31-90) days  Pt's BP would be in normal limits within next 45 days    [facility] Long Term Goal Start Date  03/08/15   [facility] Long Term Goal Met Date  04/17/15   Interventions for Problem Three Long Term Goal  Discussed with pt checking BP more often,recording, importance of hydration.          Zara Chess.   [place] [facility]  [phone]

## 2015-04-28 DIAGNOSIS — J449 Chronic obstructive pulmonary disease, unspecified: Secondary | ICD-10-CM | POA: Diagnosis not present

## 2015-05-06 ENCOUNTER — Ambulatory Visit (INDEPENDENT_AMBULATORY_CARE_PROVIDER_SITE_OTHER): Payer: Medicare Other | Admitting: Family Medicine

## 2015-05-06 ENCOUNTER — Ambulatory Visit: Payer: Medicare Other | Admitting: Family Medicine

## 2015-05-06 ENCOUNTER — Encounter: Payer: Self-pay | Admitting: Family Medicine

## 2015-05-06 VITALS — BP 108/52 | HR 78 | Temp 98.0°F | Resp 16 | Wt 153.0 lb

## 2015-05-06 DIAGNOSIS — E1342 Other specified diabetes mellitus with diabetic polyneuropathy: Secondary | ICD-10-CM | POA: Diagnosis not present

## 2015-05-06 DIAGNOSIS — R05 Cough: Secondary | ICD-10-CM

## 2015-05-06 DIAGNOSIS — I10 Essential (primary) hypertension: Secondary | ICD-10-CM

## 2015-05-06 DIAGNOSIS — R059 Cough, unspecified: Secondary | ICD-10-CM

## 2015-05-06 LAB — POCT GLYCOSYLATED HEMOGLOBIN (HGB A1C): HEMOGLOBIN A1C: 10.4

## 2015-05-06 MED ORDER — PIOGLITAZONE HCL 30 MG PO TABS: 30.0000 mg | ORAL_TABLET | Freq: Every day | ORAL | Status: DC

## 2015-05-06 MED ORDER — BENZONATATE 100 MG PO CAPS: 200.0000 mg | ORAL_CAPSULE | Freq: Three times a day (TID) | ORAL | Status: DC | PRN

## 2015-05-06 NOTE — Progress Notes (Signed)
Patient ID: [name] [name], female   DOB: 1940-11-27, 75 y.o.   MRN: [id number]    Subjective:  HPI  Diabetes Mellitus Type II, Follow-up:   Lab Results  Component Value Date   HGBA1C 8.4 01/01/2015   HGBA1C 7.9* 08/30/2014    Last seen for diabetes 4 months ago.  Management since then includes none. Pt is not taking Invokana or Onglyza because she ran out and could not afford to get it refilled.  She reports fair compliance with treatment. She is not having side effects.  Current symptoms include none. Home blood sugar records: 200's  Episodes of hypoglycemia? no   Current Insulin Regimen: n/a Most Recent Eye Exam:about 6 months ago. Current exercise: none  Pertinent Labs:    Component Value Date/Time   CHOL 195 08/30/2014 0506   TRIG 197* 08/30/2014 0506   HDL 47 08/30/2014 0506   LDLCALC 109* 08/30/2014 0506   CREATININE 1.05* 02/07/2015 1526    Wt Readings from Last 3 Encounters:  05/06/15 153 lb (69.4 kg)  04/17/15 152 lb (68.947 kg)  03/08/15 152 lb (68.947 kg)    ------------------------------------------------------------------------ Pt reports that she is still having a cough. She would like a refill on Tessalon pearles for her and her husband (he's also a pt)    Prior to Admission medications   Medication Sig Start Date End Date Taking? Authorizing Provider  albuterol (PROVENTIL) (5 MG/ML) 0.5% nebulizer solution Take 2.5 mg by nebulization 2 (two) times daily.   Yes Historical Provider, MD  amitriptyline (ELAVIL) 10 MG tablet Take 0.5 tablets (5 mg total) by mouth at bedtime. 10/30/14  Yes Haedyn Breau Maceo Pro., MD  aspirin 81 MG tablet Take 81 mg by mouth every morning.    Yes Historical Provider, MD  atorvastatin (LIPITOR) 20 MG tablet Take 1 tablet by mouth at bedtime. 07/22/14  Yes Historical Provider, MD  Azelastine-Fluticasone (DYMISTA) 137-50 MCG/ACT SUSP Place 1 spray into the nose daily.   Yes Historical Provider, MD  budesonide-formoterol  (SYMBICORT) 160-4.5 MCG/ACT inhaler Inhale 2 puffs into the lungs 2 (two) times daily. 12/06/14  Yes [name], MD  clopidogrel (PLAVIX) 75 MG tablet Take 1 tablet (75 mg total) by mouth daily. 10/25/14  Yes Algernon Huxley, MD  Cyanocobalamin (VITAMIN B 12 PO) Take 1,000 mcg by mouth daily.   Yes Historical Provider, MD  diazepam (VALIUM) 5 MG tablet Take 1 tablet (5 mg total) by mouth every 6 (six) hours as needed for muscle spasms. 10/20/13  Yes Newman Pies, MD  gabapentin (NEURONTIN) 600 MG tablet Take 1 tablet by mouth two  times daily 01/11/15  Yes Anola Mcgough Maceo Pro., MD  glimepiride (AMARYL) 4 MG tablet Take 1 tablet by mouth two  times daily 04/18/15  Yes Orvetta Danielski Maceo Pro., MD  isosorbide mononitrate (IMDUR) 30 MG 24 hr tablet Take 1 tablet by mouth  daily 07/23/14  Yes [name], MD  losartan-hydrochlorothiazide [facility]) 100-25 MG per tablet Take 1 tablet by mouth  daily 11/12/14  Yes Jerrol Banana., MD  magnesium oxide (MAG-OX) 400 (241.3 MG) MG tablet Take 1 tablet by mouth 2 (two) times daily. 08/04/13  Yes Historical Provider, MD  metFORMIN (GLUCOPHAGE) 1000 MG tablet Take 1,000 mg by mouth at bedtime.    Yes Historical Provider, MD  Multiple Vitamins-Minerals (ICAPS) CAPS Take 2 capsules by mouth daily. Reported on 04/17/2015   Yes Historical Provider, MD  naproxen sodium (ANAPROX) 220 MG tablet Take 220 mg  by mouth as needed. Takes twice a week as needed.   Yes Historical Provider, MD  nitroGLYCERIN (NITROSTAT) 0.4 MG SL tablet Place 1 tablet (0.4 mg total) under the tongue every 5 (five) minutes as needed for chest pain. 07/23/14  Yes [name], MD  omeprazole (PRILOSEC) 20 MG capsule Take 1 capsule (20 mg total) by mouth every morning. 10/30/14  Yes Brittnei Jagiello Maceo Pro., MD  oxyCODONE-acetaminophen (PERCOCET/ROXICET) 5-325 MG per tablet Take 1-2 tablets by mouth every 6 (six) hours as needed for severe pain. 10/25/14  Yes Algernon Huxley, MD  oxymetazoline (AFRIN) 0.05 %  nasal spray Place 1 spray into both nostrils 2 (two) times daily.   Yes Historical Provider, MD  potassium chloride (K-DUR) 10 MEQ tablet Take 3 tablets by mouth  daily as directed 01/11/15  Yes Amadea Keagy Maceo Pro., MD  Tiotropium Bromide Monohydrate (SPIRIVA RESPIMAT) 2.5 MCG/ACT AERS Inhale 1 puff into the lungs daily. 12/06/14  Yes [name], MD  INVOKANA 300 MG TABS tablet Take 1 tablet by mouth  daily Patient not taking: Reported on 05/06/2015 04/18/15   Jerrol Banana., MD  saxagliptin HCl (ONGLYZA) 5 MG TABS tablet Take 1 tablet (5 mg total) by mouth every morning. Patient not taking: Reported on 05/06/2015 10/30/14   Jerrol Banana., MD    Patient Active Problem List   Diagnosis Date Noted  . NSTEMI (non-ST elevated myocardial infarction) ([place]) 01/27/2015  . Chronic vulvitis 09/26/2014  . Allergic reaction 09/26/2014  . Carotid stenosis 08/30/2014  . Cervical nerve root disorder 08/10/2014  . Absolute anemia 08/10/2014  . CAD in native artery 08/10/2014  . B12 deficiency 08/10/2014  . Back ache 08/10/2014  . Bronchitis, chronic ([place]) 08/10/2014  . Diabetes mellitus with polyneuropathy ([place]) 08/10/2014  . Can't get food down 08/10/2014  . Eczema of external ear 08/10/2014  . Accumulation of fluid in tissues 08/10/2014  . Gout 08/10/2014  . Adult hypothyroidism 08/10/2014  . Mononeuritis 08/10/2014  . Muscle ache 08/10/2014  . Disorder of peripheral nervous system ([place]) 08/10/2014  . Lesion of vulva 08/10/2014  . Cervical spondylosis with radiculopathy 10/19/2013  . Unstable angina ([place]) 08/15/2013  . COPD exacerbation ([place]) 03/29/2013  . CAD (coronary artery disease) 06/22/2011  . COPD (chronic obstructive pulmonary disease) with emphysema ([place]) 03/07/2010  . CHEST PAIN UNSPECIFIED 07/22/2009  . HLD (hyperlipidemia) 04/01/2009  . Malaise and fatigue 04/01/2009  . HYPERLIPIDEMIA-MIXED 01/18/2009  . TOBACCO ABUSE 01/18/2009  . HYPERTENSION, BENIGN 01/18/2009  .  CLAUDICATION 01/18/2009  . Pain in limb 01/18/2009  . CAFL (chronic airflow limitation) ([place]) 01/20/2007  . Late effects of cerebrovascular disease 01/10/2007  . Essential (primary) hypertension 12/22/2006    Past Medical History  Diagnosis Date  . Coronary artery disease, non-occlusive     a. cath 2006: min nonobs CAD; b. cath 12/2010: cath LAD 50%, RCA 60%; c. 08/2013: Minimal luminal irregs, right dominant system with no significant CAD, diffuse luminal irregs noted. Normal EF 55%, no AS or MS.   Marland Kitchen Hypertension   . Diabetes mellitus   . Hyperlipemia     Followed by Dr. Rosanna Randy  . Stroke ([place])   . COPD (chronic obstructive pulmonary disease) ([place])   . Shortness of breath   . Pneumonia     hx  . Macular degeneration     rt  . COPD (chronic obstructive pulmonary disease) ([place])     Social History   Social History  . Marital Status:  Married    Spouse Name: N/A  . Number of Children: N/A  . Years of Education: N/A   Occupational History  . Retired, Environmental consultant    Social History Main Topics  . Smoking status: Current Every Day Smoker -- 0.25 packs/day for 54 years    Types: Cigarettes  . Smokeless tobacco: Never Used     Comment: smokes 3 cigs daily 05/06/15. Pt instructed to quit.  . Alcohol Use: No  . Drug Use: No  . Sexual Activity: No   Other Topics Concern  . Not on file   Social History Narrative   Married with 4 children   Gets regular exercise    Allergies  Allergen Reactions  . Coconut Fatty Acids Swelling    Throat swells    Review of Systems  Constitutional: Negative.   HENT: Negative.   Eyes: Negative.   Respiratory: Positive for cough.   Cardiovascular: Negative.   Gastrointestinal: Negative.   Genitourinary: Negative.   Musculoskeletal: Negative.   Skin: Negative.   Neurological: Negative.   Endo/Heme/Allergies: Negative.   Psychiatric/Behavioral: Negative.     Immunization History  Administered Date(s) Administered  . Influenza  Split 02/29/2012  . Influenza Whole 12/05/2009, 12/10/2010  . Influenza, High Dose Seasonal PF 01/01/2015  . Influenza,inj,Quad PF,36+ Mos 01/04/2013  . Influenza-Unspecified 02/04/2014  . Pneumococcal Conjugate-13 02/29/2012  . Pneumococcal Polysaccharide-23 01/05/2008  . Tdap 01/05/2012   Objective:  BP 108/52 mmHg  Pulse 78  Temp(Src) 98 F (36.7 C) (Oral)  Resp 16  Wt 153 lb (69.4 kg)  SpO2 94%  Physical Exam  Constitutional: She is oriented to person, place, and time and well-developed, well-nourished, and in no distress.  Eyes: Conjunctivae and EOM are normal. Pupils are equal, round, and reactive to light.  Neck: Normal range of motion. Neck supple.  Cardiovascular: Normal rate, regular rhythm, normal heart sounds and intact distal pulses.   Pulmonary/Chest: Effort normal and breath sounds normal.  Neurological: She is alert and oriented to person, place, and time. She has normal reflexes. Gait normal. GCS score is 15.  Skin: Skin is warm and dry.  Psychiatric: Mood, memory, affect and judgment normal.    Lab Results  Component Value Date   WBC 8.9 01/29/2015   HGB 12.4 01/29/2015   HCT 36.8 01/29/2015   PLT 167 01/29/2015   GLUCOSE 186* 02/07/2015   CHOL 195 08/30/2014   TRIG 197* 08/30/2014   HDL 47 08/30/2014   LDLCALC 109* 08/30/2014   INR 0.89 01/27/2015   HGBA1C 8.4 01/01/2015    CMP     Component Value Date/Time   NA 139 02/07/2015 1526   NA 140 01/29/2015 0553   K 4.6 02/07/2015 1526   K 3.9 12/16/2011 1232   CL 95* 02/07/2015 1526   CO2 25 02/07/2015 1526   GLUCOSE 186* 02/07/2015 1526   GLUCOSE 176* 01/29/2015 0553   BUN 15 02/07/2015 1526   BUN 18 01/29/2015 0553   CREATININE 1.05* 02/07/2015 1526   CALCIUM 9.3 02/07/2015 1526   PROT 6.7 08/02/2008 2232   ALBUMIN 4.3 02/07/2015 1526   ALBUMIN 4.2 08/02/2008 2232   AST 10 08/02/2008 2232   ALT 10 08/02/2008 2232   ALKPHOS 77 08/02/2008 2232   BILITOT 0.3 08/02/2008 2232   GFRNONAA 53*  02/07/2015 1526   GFRAA 61 02/07/2015 1526    Assessment and Plan :  1. Diabetic polyneuropathy associated with other specified diabetes mellitus ([place])  - POCT HgB A1C 10.4 today.  Pt has not been taking Invokana or Onglyza. Will try Actos and follow up in 3 months.  - pioglitazone (ACTOS) 30 MG tablet; Take 1 tablet (30 mg total) by mouth daily.  Dispense: 90 tablet; Refill: 3  2. HYPERTENSION, BENIGN   3. Cough Pt strongly advised to quit smoking. - benzonatate (TESSALON) 100 MG capsule; Take 2 capsules (200 mg total) by mouth 3 (three) times daily as needed for cough.  Dispense: 90 capsule; Refill: 2  Patient was seen and examined by Dr. Miguel Aschoff, and noted scribed by Webb Laws, [place] MD [facility] 05/06/2015 11:35 AM

## 2015-05-21 ENCOUNTER — Ambulatory Visit: Payer: Self-pay | Admitting: Internal Medicine

## 2015-05-23 ENCOUNTER — Other Ambulatory Visit: Payer: Self-pay | Admitting: *Deleted

## 2015-05-23 NOTE — Patient Outreach (Addendum)
[facility]) Care Management   05/23/2015  VIRGINA DEAKINS 1940/09/11 [id number]  Elvi D Barile is an 75 y.o. female  Subjective:  Pt reports f/u with MD last month, was told A1C 10.4    Pt reports MD took her off [place], put her on Pioglitazone.  Pt reports she has been on the new medication now for a month, sugars are under 250, today 193 (med working).  Pt reports to have A1c rechecked in May.  Pt reports cough is better, no problems breathing, continues with use of O2 at night.  Pt reports she was also taken off Plavix, reaction to new diabetic med but it was time to discontinue (to be on for 6 months).   Objective:   Filed Vitals:   05/23/15 1429  BP: 104/50  Pulse: 82  Resp: 20    ROS  Physical Exam  Constitutional: She is oriented to person, place, and time. She appears well-developed and well-nourished.  Cardiovascular: Normal rate and regular rhythm.   Respiratory: Effort normal.  Slight crackles noted on inspiration in posterior LLL   GI: Soft. Bowel sounds are normal.  Musculoskeletal: Normal range of motion.  Neurological: She is alert and oriented to person, place, and time.  Skin: Skin is warm and dry.  Small bruise noted on left elbow.    Psychiatric: She has a normal mood and affect. Her behavior is normal. Judgment and thought content normal.    Current Medications:   Current Outpatient Prescriptions  Medication Sig Dispense Refill  . albuterol (PROVENTIL) (5 MG/ML) 0.5% nebulizer solution Take 2.5 mg by nebulization 2 (two) times daily.    Marland Kitchen amitriptyline (ELAVIL) 10 MG tablet Take 0.5 tablets (5 mg total) by mouth at bedtime. 90 tablet 2  . aspirin 81 MG tablet Take 81 mg by mouth every morning.     Marland Kitchen atorvastatin (LIPITOR) 20 MG tablet Take 1 tablet by mouth at bedtime.    . Azelastine-Fluticasone (DYMISTA) 137-50 MCG/ACT SUSP Place 1 spray into the nose daily.    . budesonide-formoterol (SYMBICORT) 160-4.5 MCG/ACT inhaler  Inhale 2 puffs into the lungs 2 (two) times daily. 1 Inhaler 0  . Cyanocobalamin (VITAMIN B 12 PO) Take 1,000 mcg by mouth daily.    . diazepam (VALIUM) 5 MG tablet Take 1 tablet (5 mg total) by mouth every 6 (six) hours as needed for muscle spasms. 50 tablet 1  . gabapentin (NEURONTIN) 600 MG tablet Take 1 tablet by mouth two  times daily 180 tablet 1  . glimepiride (AMARYL) 4 MG tablet Take 1 tablet by mouth two  times daily 180 tablet 3  . isosorbide mononitrate (IMDUR) 30 MG 24 hr tablet Take 1 tablet by mouth  daily 90 tablet 3  . losartan-hydrochlorothiazide (HYZAAR) 100-25 MG per tablet Take 1 tablet by mouth  daily 90 tablet 3  . magnesium oxide (MAG-OX) 400 (241.3 MG) MG tablet Take 1 tablet by mouth 2 (two) times daily.    . metFORMIN (GLUCOPHAGE) 1000 MG tablet Take 1,000 mg by mouth at bedtime.     . Multiple Vitamins-Minerals (ICAPS) CAPS Take 2 capsules by mouth daily. Reported on 04/17/2015    . naproxen sodium (ANAPROX) 220 MG tablet Take 220 mg by mouth as needed. Takes twice a week as needed.    Marland Kitchen oxymetazoline (AFRIN) 0.05 % nasal spray Place 1 spray into both nostrils 2 (two) times daily.    . pioglitazone (ACTOS) 30 MG tablet Take 1 tablet (  30 mg total) by mouth daily. 90 tablet 3  . potassium chloride (K-DUR) 10 MEQ tablet Take 3 tablets by mouth  daily as directed 270 tablet 1  . Tiotropium Bromide Monohydrate (SPIRIVA RESPIMAT) 2.5 MCG/ACT AERS Inhale 1 puff into the lungs daily. 12 g 3  . benzonatate (TESSALON) 100 MG capsule Take 2 capsules (200 mg total) by mouth 3 (three) times daily as needed for cough. 90 capsule 2  . clopidogrel (PLAVIX) 75 MG tablet Take 1 tablet (75 mg total) by mouth daily. (Patient not taking: Reported on 05/23/2015) 30 tablet 6  . INVOKANA 300 MG TABS tablet Take 1 tablet by mouth  daily (Patient not taking: Reported on 05/06/2015) 90 tablet 3  . nitroGLYCERIN (NITROSTAT) 0.4 MG SL tablet Place 1 tablet (0.4 mg total) under the tongue every 5 (five)  minutes as needed for chest pain. (Patient not taking: Reported on 05/23/2015) 25 tablet 3  . omeprazole (PRILOSEC) 20 MG capsule Take 1 capsule (20 mg total) by mouth every morning. 90 capsule 2  . oxyCODONE-acetaminophen (PERCOCET/ROXICET) 5-325 MG per tablet Take 1-2 tablets by mouth every 6 (six) hours as needed for severe pain. 30 tablet 0  . saxagliptin HCl (ONGLYZA) 5 MG TABS tablet Take 1 tablet (5 mg total) by mouth every morning. (Patient not taking: Reported on 05/06/2015) 90 tablet 2   No current facility-administered medications for this visit.    Functional Status:   In your present state of health, do you have any difficulty performing the following activities: 05/06/2015 03/08/2015  Hearing? N N  Vision? N -  Difficulty concentrating or making decisions? N N  Walking or climbing stairs? Y N  Dressing or bathing? N N  Doing errands, shopping? N N  Preparing Food and eating ? - -  Using the Toilet? - -  In the past six months, have you accidently leaked urine? - -  Do you have problems with loss of bowel control? - -  Managing your Medications? - -  Managing your Finances? - -  Housekeeping or managing your Housekeeping? - -    Fall/Depression Screening:    PHQ 2/9 Scores 02/07/2015 02/05/2015  PHQ - 2 Score 1 1  PHQ- 9 Score 7 -    Assessment:  DM - view of pt's blood sugar averages  via her glucometer = 7 day 182, 14 day 175, 22 day 192.                           Today's reading 193.  Demonstrated to pt how to assess her averages on her glucometer.                                                    COPD- slight crackles noted on inspiration in posterior LLL.  Discussed ongoing use of                                       Medications.    Plan:   As discussed with pt, plan to discharge from RN CM services.  DM- education has been provided/                reviewed/sugars coming down with new  diabetic medication/A1C to be rechecked in May.  Discussed                With  pt having a [facility] f/u (further education) to which declined.              As discussed with pt, ongoing compliance with diabetic diet/medication.             Plan to inform Dr. Rosanna Randy of case closure via in basket in [place] (letter).               Plan to inform [facility] care management assistant to close case, goals met.                 [facility] CM Care Plan Problem One        Most Recent Value   Care Plan Problem One  Diabetes- elevated blood sugars    Role Documenting the Problem One  Care Management Coordinator   Care Plan for Problem One  Active   [facility] Long Term Goal (31-90 days)  Pt's A1c would be down one point within next 45 days    [facility] Long Term Goal Start Date  04/17/15   [facility] Long Term Goal Met Date  -- [not met.  most recent 10.4 ]   Interventions for Problem One Long Term Goal  Reviewed with pt previous [facility]i informatio emailed to pt- Carbohydrate counting, small food portions    [facility] CM Short Term Goal #2 (0-30 days)  Pt's sugars would be under 250 in the next 30 days    [facility] CM Short Term Goal #2 Start Date  04/17/15   [facility] CM Short Term Goal #2 Met Date  05/23/15   Interventions for Short Term Goal #2  Discussed with pt ongoing complinace with oral diabetic medications, call MD ahead of time for samples to avoid running out.     [facility] CM Care Plan Problem Two        Most Recent Value   Care Plan Problem Two  COPD-  hx of bronchitis, pneumonia in winter months    Role Documenting the Problem Two  Care Management [place] for Problem Two  Not Active     Zara Chess.   [place] Care Management  [phone]

## 2015-05-24 ENCOUNTER — Encounter: Payer: Self-pay | Admitting: *Deleted

## 2015-05-29 DIAGNOSIS — J449 Chronic obstructive pulmonary disease, unspecified: Secondary | ICD-10-CM | POA: Diagnosis not present

## 2015-06-06 ENCOUNTER — Other Ambulatory Visit: Payer: Self-pay | Admitting: Family Medicine

## 2015-06-20 ENCOUNTER — Ambulatory Visit (INDEPENDENT_AMBULATORY_CARE_PROVIDER_SITE_OTHER): Payer: Medicare Other | Admitting: Family Medicine

## 2015-06-20 VITALS — BP 116/60 | HR 84 | Temp 98.0°F | Resp 18 | Wt 157.0 lb

## 2015-06-20 DIAGNOSIS — Z20828 Contact with and (suspected) exposure to other viral communicable diseases: Secondary | ICD-10-CM | POA: Diagnosis not present

## 2015-06-20 DIAGNOSIS — J069 Acute upper respiratory infection, unspecified: Secondary | ICD-10-CM | POA: Diagnosis not present

## 2015-06-20 DIAGNOSIS — J439 Emphysema, unspecified: Secondary | ICD-10-CM

## 2015-06-20 MED ORDER — OSELTAMIVIR PHOSPHATE 75 MG PO CAPS: 75.0000 mg | ORAL_CAPSULE | Freq: Two times a day (BID) | ORAL | Status: DC

## 2015-06-20 MED ORDER — DOXYCYCLINE HYCLATE 100 MG PO TABS: 100.0000 mg | ORAL_TABLET | Freq: Two times a day (BID) | ORAL | Status: DC

## 2015-06-20 MED ORDER — PREDNISONE 10 MG PO TABS: 10.0000 mg | ORAL_TABLET | Freq: Every day | ORAL | Status: DC

## 2015-06-20 NOTE — Progress Notes (Signed)
Patient ID: [name] [name], female   DOB: 05/13/1940, 75 y.o.   MRN: [id number]   [name] [name]  MRN: [id number] DOB: 04-Mar-1941  Subjective:  HPI  1. Upper respiratory infection The patient is a 75 year old female who presents for evaluation of respiratory symptoms.  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  She complains of runny nose, watery eyes, cough productive of clear but very thick congestion.No fevers,no  hemoptysis.    Patient Active Problem List   Diagnosis Date Noted  . NSTEMI (non-ST elevated myocardial infarction) ([place]) 01/27/2015  . Chronic vulvitis 09/26/2014  . Allergic reaction 09/26/2014  . Carotid stenosis 08/30/2014  . Cervical nerve root disorder 08/10/2014  . Absolute anemia 08/10/2014  . CAD in native artery 08/10/2014  . B12 deficiency 08/10/2014  . Back ache 08/10/2014  . Bronchitis, chronic ([place]) 08/10/2014  . Diabetes mellitus with polyneuropathy ([place]) 08/10/2014  . Can't get food down 08/10/2014  . Eczema of external ear 08/10/2014  . Accumulation of fluid in tissues 08/10/2014  . Gout 08/10/2014  . Adult hypothyroidism 08/10/2014  . Mononeuritis 08/10/2014  . Muscle ache 08/10/2014  . Disorder of peripheral nervous system ([place]) 08/10/2014  . Lesion of vulva 08/10/2014  . Cervical spondylosis with radiculopathy 10/19/2013  . Unstable angina ([place]) 08/15/2013  . COPD exacerbation ([place]) 03/29/2013  . CAD (coronary artery disease) 06/22/2011  . COPD (chronic obstructive pulmonary disease) with emphysema ([place]) 03/07/2010  . CHEST PAIN UNSPECIFIED 07/22/2009  . HLD (hyperlipidemia) 04/01/2009  . Malaise and fatigue 04/01/2009  . HYPERLIPIDEMIA-MIXED 01/18/2009  . TOBACCO ABUSE 01/18/2009  . HYPERTENSION, BENIGN 01/18/2009  . CLAUDICATION 01/18/2009  . Pain in limb 01/18/2009  . CAFL (chronic airflow limitation) ([place]) 01/20/2007  . Late effects of cerebrovascular disease 01/10/2007  . Essential (primary)  hypertension 12/22/2006    Past Medical History  Diagnosis Date  . Coronary artery disease, non-occlusive     a. cath 2006: min nonobs CAD; b. cath 12/2010: cath LAD 50%, RCA 60%; c. 08/2013: Minimal luminal irregs, right dominant system with no significant CAD, diffuse luminal irregs noted. Normal EF 55%, no AS or MS.   Marland Kitchen Hypertension   . Diabetes mellitus   . Hyperlipemia     Followed by Dr. Rosanna Randy  . Stroke ([place])   . COPD (chronic obstructive pulmonary disease) ([place])   . Shortness of breath   . Pneumonia     hx  . Macular degeneration     rt  . COPD (chronic obstructive pulmonary disease) ([place])     Social History   Social History  . Marital Status: Married    Spouse Name: N/A  . Number of Children: N/A  . Years of Education: N/A   Occupational History  . Retired, Environmental consultant    Social History Main Topics  . Smoking status: Current Every Day Smoker -- 0.25 packs/day for 54 years    Types: Cigarettes  . Smokeless tobacco: Never Used     Comment: smokes 3 cigs daily 05/06/15. Pt instructed to quit.  . Alcohol Use: No  . Drug Use: No  . Sexual Activity: No   Other Topics Concern  . Not on file   Social History Narrative   Married with 4 children   Gets regular exercise    Outpatient Prescriptions Prior to Visit  Medication Sig Dispense Refill  . albuterol (PROVENTIL) (5 MG/ML) 0.5% nebulizer solution Take 2.5 mg by nebulization 2 (two) times  daily.    . amitriptyline (ELAVIL) 10 MG tablet Take 0.5 tablets (5 mg total) by mouth at bedtime. 90 tablet 2  . aspirin 81 MG tablet Take 81 mg by mouth every morning.     Marland Kitchen atorvastatin (LIPITOR) 20 MG tablet Take 1 tablet by mouth at bedtime.    . Azelastine-Fluticasone (DYMISTA) 137-50 MCG/ACT SUSP Place 1 spray into the nose daily.    . budesonide-formoterol (SYMBICORT) 160-4.5 MCG/ACT inhaler Inhale 2 puffs into the lungs 2 (two) times daily. 1 Inhaler 0  . clopidogrel (PLAVIX) 75 MG tablet Take 1 tablet (75 mg  total) by mouth daily. 30 tablet 6  . Cyanocobalamin (VITAMIN B 12 PO) Take 1,000 mcg by mouth daily.    . diazepam (VALIUM) 5 MG tablet Take 1 tablet (5 mg total) by mouth every 6 (six) hours as needed for muscle spasms. 50 tablet 1  . gabapentin (NEURONTIN) 600 MG tablet Take 1 tablet by mouth two  times daily 180 tablet 3  . glimepiride (AMARYL) 4 MG tablet Take 1 tablet by mouth two  times daily 180 tablet 3  . isosorbide mononitrate (IMDUR) 30 MG 24 hr tablet Take 1 tablet by mouth  daily 90 tablet 3  . losartan-hydrochlorothiazide (HYZAAR) 100-25 MG per tablet Take 1 tablet by mouth  daily 90 tablet 3  . magnesium oxide (MAG-OX) 400 (241.3 MG) MG tablet Take 1 tablet by mouth 2 (two) times daily.    . metFORMIN (GLUCOPHAGE) 1000 MG tablet Take 1,000 mg by mouth at bedtime.     . Multiple Vitamins-Minerals (ICAPS) CAPS Take 2 capsules by mouth daily. Reported on 04/17/2015    . naproxen sodium (ANAPROX) 220 MG tablet Take 220 mg by mouth as needed. Takes twice a week as needed.    . nitroGLYCERIN (NITROSTAT) 0.4 MG SL tablet Place 1 tablet (0.4 mg total) under the tongue every 5 (five) minutes as needed for chest pain. 25 tablet 3  . omeprazole (PRILOSEC) 20 MG capsule Take 1 capsule (20 mg total) by mouth every morning. 90 capsule 2  . oxyCODONE-acetaminophen (PERCOCET/ROXICET) 5-325 MG per tablet Take 1-2 tablets by mouth every 6 (six) hours as needed for severe pain. 30 tablet 0  . oxymetazoline (AFRIN) 0.05 % nasal spray Place 1 spray into both nostrils 2 (two) times daily.    . pioglitazone (ACTOS) 30 MG tablet Take 1 tablet (30 mg total) by mouth daily. 90 tablet 3  . potassium chloride (K-DUR) 10 MEQ tablet Take 3 tablets by mouth  daily as directed 270 tablet 1  . potassium chloride (K-DUR,KLOR-CON) 10 MEQ tablet Take 3 tablets by mouth  daily as directed 270 tablet 3  . Tiotropium Bromide Monohydrate (SPIRIVA RESPIMAT) 2.5 MCG/ACT AERS Inhale 1 puff into the lungs daily. 12 g 3  .  saxagliptin HCl (ONGLYZA) 5 MG TABS tablet Take 1 tablet (5 mg total) by mouth every morning. 90 tablet 2  . benzonatate (TESSALON) 100 MG capsule Take 2 capsules (200 mg total) by mouth 3 (three) times daily as needed for cough. 90 capsule 2  . INVOKANA 300 MG TABS tablet Take 1 tablet by mouth  daily (Patient not taking: Reported on 05/06/2015) 90 tablet 3   No facility-administered medications prior to visit.    Allergies  Allergen Reactions  . Coconut Fatty Acids Swelling    Throat swells    Review of Systems  Constitutional: Positive for chills, malaise/fatigue and diaphoresis. Negative for fever.  Respiratory: Positive for cough,  hemoptysis, sputum production and shortness of breath (Not any more than normal). Wheezing: Not any more than normal.   Cardiovascular: Positive for orthopnea. Negative for chest pain, palpitations and leg swelling.  Neurological: Positive for dizziness (After coughing spells), weakness and headaches.   Objective:  BP 116/60 mmHg  Pulse 84  Temp(Src) 98 F (36.7 C) (Oral)  Resp 18  Wt 157 lb (71.215 kg)  Physical Exam  Constitutional: She is well-developed, well-nourished, and in no distress.  HENT:  Head: Normocephalic and atraumatic.  Right Ear: External ear normal.  Left Ear: External ear normal.  Nose: Nose normal.  Mouth/Throat: Oropharynx is clear and moist.  Eyes: Conjunctivae are normal.  Neck: Neck supple. No thyromegaly present.  Cardiovascular: Normal rate, regular rhythm and normal heart sounds.   Pulmonary/Chest: Effort normal. She has wheezes.  Mild inspiratory and expiratory wheezes.  Lymphadenopathy:    She has no cervical adenopathy.  Skin: Skin is warm and dry.  Psychiatric: Mood, memory, affect and judgment normal.    Assessment and Plan :   1. Upper respiratory infection   2. Pulmonary emphysema, unspecified emphysema type (HCC)/COPD exacerbation.  - doxycycline (VIBRA-TABS) 100 MG tablet; Take 1 tablet (100 mg  total) by mouth 2 (two) times daily.  Dispense: 20 tablet; Refill: 0 - predniSONE (DELTASONE) 10 MG tablet; Take 1 tablet (10 mg total) by mouth daily with breakfast.  Dispense: 42 tablet; Refill: 0  3. Exposure to influenza  - oseltamivir (TAMIFLU) 75 MG capsule; Take 1 capsule (75 mg total) by mouth 2 (two) times daily.  Dispense: 14 capsule; Refill: 0 4. Tobacco abuse Patient advised to completely quit smoking. I have done the exam and reviewed the above chart and it is accurate to the best of my knowledge.  Miguel Aschoff MD [facility] 06/20/2015 2:01 PM

## 2015-06-26 ENCOUNTER — Telehealth: Payer: Self-pay | Admitting: Family Medicine

## 2015-06-26 DIAGNOSIS — J449 Chronic obstructive pulmonary disease, unspecified: Secondary | ICD-10-CM | POA: Diagnosis not present

## 2015-06-26 MED ORDER — ONETOUCH ULTRA SYSTEM W/DEVICE KIT: PACK | Status: DC

## 2015-06-26 MED ORDER — GLUCOSE BLOOD VI STRP: ORAL_STRIP | Status: DC

## 2015-06-26 NOTE — Telephone Encounter (Signed)
Dr. Rosanna Randy, what is the best way to do this? Do we give patient one of our glucometer's here? Or do I call patient and ask which one is covered by insurance? Please advise. Thanks!

## 2015-06-26 NOTE — Telephone Encounter (Signed)
Pt would like orders for a new meter and test strips sent to [facility]. Please advise. Thanks TNP

## 2015-06-26 NOTE — Telephone Encounter (Signed)
Alina or Ana or Vevelyn Royals should be able to help you with that question. they are graded handling that.I am okay with calling in a glucometer and a years worth of strips.

## 2015-06-26 NOTE — Telephone Encounter (Signed)
Spoke with patient and she states [facility] told her to get one touch ultra 2 meter sent in, RX sent in for meter, strips and lancets-aa

## 2015-07-02 ENCOUNTER — Telehealth: Payer: Self-pay | Admitting: Cardiovascular Disease

## 2015-07-02 ENCOUNTER — Ambulatory Visit (INDEPENDENT_AMBULATORY_CARE_PROVIDER_SITE_OTHER): Payer: Medicare Other | Admitting: Internal Medicine

## 2015-07-02 ENCOUNTER — Encounter: Payer: Self-pay | Admitting: Internal Medicine

## 2015-07-02 VITALS — BP 128/62 | HR 75 | Ht 64.0 in | Wt 150.4 lb

## 2015-07-02 DIAGNOSIS — J449 Chronic obstructive pulmonary disease, unspecified: Secondary | ICD-10-CM | POA: Diagnosis not present

## 2015-07-02 NOTE — Telephone Encounter (Signed)
Pt c/o of Chest Pain: STAT if CP now or developed within 24 hours  1. Are you having CP right now?   No   2. Are you experiencing any other symptoms (ex. SOB, nausea, vomiting, sweating)?   No   3. How long have you been experiencing CP? Comes and goes for a couple weeks   4. Is your CP continuous or coming and going? Comes and goes only when laying down  Woke up with the pain   5. Have you taken Nitroglycerin?   Yes , has taken for the past 3 days  ?

## 2015-07-02 NOTE — Patient Instructions (Signed)
Chronic Obstructive Pulmonary Disease Chronic obstructive pulmonary disease (COPD) is a common lung condition in which airflow from the lungs is limited. COPD is a general term that can be used to describe many different lung problems that limit airflow, including both chronic bronchitis and emphysema. If you have COPD, your lung function will probably never return to normal, but there are measures you can take to improve lung function and make yourself feel better. CAUSES   Smoking (common).  Exposure to secondhand smoke.  Genetic problems.  Chronic inflammatory lung diseases or recurrent infections. SYMPTOMS  Shortness of breath, especially with physical activity.  Deep, persistent (chronic) cough with a large amount of thick mucus.  Wheezing.  Rapid breaths (tachypnea).  Gray or bluish discoloration (cyanosis) of the skin, especially in your fingers, toes, or lips.  Fatigue.  Weight loss.  Frequent infections or episodes when breathing symptoms become much worse (exacerbations).  Chest tightness. DIAGNOSIS Your health care provider will take a medical history and perform a physical examination to diagnose COPD. Additional tests for COPD may include:  Lung (pulmonary) function tests.  Chest X-ray.  CT scan.  Blood tests. TREATMENT  Treatment for COPD may include:  Inhaler and nebulizer medicines. These help manage the symptoms of COPD and make your breathing more comfortable.  Supplemental oxygen. Supplemental oxygen is only helpful if you have a low oxygen level in your blood.  Exercise and physical activity. These are beneficial for nearly all people with COPD.  Lung surgery or transplant.  Nutrition therapy to gain weight, if you are underweight.  Pulmonary rehabilitation. This may involve working with a team of health care providers and specialists, such as respiratory, occupational, and physical therapists. HOME CARE INSTRUCTIONS  Take all medicines  (inhaled or pills) as directed by your health care provider.  Avoid over-the-counter medicines or cough syrups that dry up your airway (such as antihistamines) and slow down the elimination of secretions unless instructed otherwise by your health care provider.  If you are a smoker, the most important thing that you can do is stop smoking. Continuing to smoke will cause further lung damage and breathing trouble. Ask your health care provider for help with quitting smoking. He or she can direct you to community resources or hospitals that provide support.  Avoid exposure to irritants such as smoke, chemicals, and fumes that aggravate your breathing.  Use oxygen therapy and pulmonary rehabilitation if directed by your health care provider. If you require home oxygen therapy, ask your health care provider whether you should purchase a pulse oximeter to measure your oxygen level at home.  Avoid contact with individuals who have a contagious illness.  Avoid extreme temperature and humidity changes.  Eat healthy foods. Eating smaller, more frequent meals and resting before meals may help you maintain your strength.  Stay active, but balance activity with periods of rest. Exercise and physical activity will help you maintain your ability to do things you want to do.  Preventing infection and hospitalization is very important when you have COPD. Make sure to receive all the vaccines your health care provider recommends, especially the pneumococcal and influenza vaccines. Ask your health care provider whether you need a pneumonia vaccine.  Learn and use relaxation techniques to manage stress.  Learn and use controlled breathing techniques as directed by your health care provider. Controlled breathing techniques include:  Pursed lip breathing. Start by breathing in (inhaling) through your nose for 1 second. Then, purse your lips as if you were   going to whistle and breathe out (exhale) through the  pursed lips for 2 seconds.  Diaphragmatic breathing. Start by putting one hand on your abdomen just above your waist. Inhale slowly through your nose. The hand on your abdomen should move out. Then purse your lips and exhale slowly. You should be able to feel the hand on your abdomen moving in as you exhale.  Learn and use controlled coughing to clear mucus from your lungs. Controlled coughing is a series of short, progressive coughs. The steps of controlled coughing are: 1. Lean your head slightly forward. 2. Breathe in deeply using diaphragmatic breathing. 3. Try to hold your breath for 3 seconds. 4. Keep your mouth slightly open while coughing twice. 5. Spit any mucus out into a tissue. 6. Rest and repeat the steps once or twice as needed. SEEK MEDICAL CARE IF:  You are coughing up more mucus than usual.  There is a change in the color or thickness of your mucus.  Your breathing is more labored than usual.  Your breathing is faster than usual. SEEK IMMEDIATE MEDICAL CARE IF:  You have shortness of breath while you are resting.  You have shortness of breath that prevents you from:  Being able to talk.  Performing your usual physical activities.  You have chest pain lasting longer than 5 minutes.  Your skin color is more cyanotic than usual.  You measure low oxygen saturations for longer than 5 minutes with a pulse oximeter. MAKE SURE YOU:  Understand these instructions.  Will watch your condition.  Will get help right away if you are not doing well or get worse.   This information is not intended to replace advice given to you by your health care provider. Make sure you discuss any questions you have with your health care provider.   Document Released: 12/31/2004 Document Revised: 04/13/2014 Document Reviewed: 11/17/2012 Elsevier Interactive Patient Education 2016 [employer].  

## 2015-07-02 NOTE — Telephone Encounter (Signed)
Pt saw Dr. Mortimer Fries today, Chest CT was ordered. Pt placed on waiting list in the event of a cancellation.

## 2015-07-02 NOTE — Progress Notes (Signed)
   Subjective:    Patient ID: [name] [name], female    DOB: 25-Jul-1940, 75 y.o.   MRN: [id number]  HPI/follow up COPD/Chronic SOB  The patient comes in today for follow-up of her known COPD. She is staying on her bronchodilator regimen, and has not had a recent acute exacerbation.  She continues to smoke despite counseling, patient has nocturnal oxygen ast 3 L [place]. She has no new complaints today No signs of infection at this time I have dicussed lung cancer screening and recommend CT chest -she is ok with this   BP 128/62 mmHg  Pulse 75  Ht '5\' 4"'$  (1.626 m)  Wt 150 lb 6.4 oz (68.221 kg)  BMI 25.80 kg/m2  SpO2 95%    Review of Systems  Constitutional: Negative for fever and unexpected weight change.  HENT: Negative for congestion, dental problem, ear pain, nosebleeds, postnasal drip, rhinorrhea, sinus pressure, sneezing, sore throat and trouble swallowing.   Eyes: Negative for redness and itching.  Respiratory: Positive for shortness of breath. Negative for cough, chest tightness and wheezing.   Cardiovascular: Negative for palpitations and leg swelling.  Gastrointestinal: Negative for nausea and vomiting.  Genitourinary: Negative for dysuria.  Musculoskeletal: Negative for joint swelling.  Skin: Negative for rash.  Neurological: Negative for headaches.  Hematological: Does not bruise/bleed easily.  Psychiatric/Behavioral: Negative for dysphoric mood. The patient is not nervous/anxious.   All other systems reviewed and are negative.      Objective:   Physical Exam  Constitutional: She is oriented to person, place, and time. She appears well-developed and well-nourished. No distress.  HENT:  Head: Normocephalic and atraumatic.  Eyes: Conjunctivae are normal. Pupils are equal, round, and reactive to light.  Cardiovascular:  No murmur heard. Pulmonary/Chest: Effort normal and breath sounds normal. No respiratory distress. She has no wheezes. She has no rales.  Abdominal:  Soft. Bowel sounds are normal.  Musculoskeletal: She exhibits no edema.  Neurological: She is alert and oriented to person, place, and time.  Skin: Skin is warm and dry. She is not diaphoretic.         Assessment & Plan:   75 yo white female seen today for follow up COPD-Her COPD is stable at this time, I will obtain PFT adn 6MWT before next visit. Patient responding well to inhaler regimen. Patient is at high risk for lung cancer  No signs of exacerbation at this time  1.continue spiriva, continue albuterol as needed 2.continue symbicort 3.continue oxygen at night, check 6 MWT prior to next visit 4.smoking cessation strongly advised 5.obtain PFT and 6 MWT prior to next visit 6.patient has agreed for lung cancer screening will provide information   The Patient requires high complexity decision making for assessment and support, frequent evaluation and titration of therapies. Patient satisfied with plan of action and management   Joyce Heitman Patricia Pesa, M.D.  [facility]  Medical Director [place] Director [facility] Cardio-Pulmonary Department

## 2015-07-03 ENCOUNTER — Telehealth: Payer: Self-pay | Admitting: Internal Medicine

## 2015-07-03 NOTE — Telephone Encounter (Signed)
[facility] called patient, stating that were not fill out the full paperwork. She stated  they need 2 liters for both patient and her  husband For the portable oxygen machine  Needs [place] to fill out the rest of the paperwork Please call back.

## 2015-07-04 NOTE — Addendum Note (Signed)
Addended by: Maryanna Shape A on: 07/04/2015 09:43 AM   Modules accepted: Orders

## 2015-07-04 NOTE — Telephone Encounter (Signed)
Paper from [place] in [place] folder for him to sign. Will fax back to [place] once signed.

## 2015-07-08 ENCOUNTER — Other Ambulatory Visit: Payer: Self-pay | Admitting: Family Medicine

## 2015-07-08 DIAGNOSIS — Z1231 Encounter for screening mammogram for malignant neoplasm of breast: Secondary | ICD-10-CM

## 2015-07-09 ENCOUNTER — Ambulatory Visit (INDEPENDENT_AMBULATORY_CARE_PROVIDER_SITE_OTHER): Payer: Medicare Other | Admitting: Cardiovascular Disease

## 2015-07-09 ENCOUNTER — Encounter: Payer: Self-pay | Admitting: Cardiovascular Disease

## 2015-07-09 VITALS — BP 106/56 | HR 98 | Ht 64.0 in | Wt 155.5 lb

## 2015-07-09 DIAGNOSIS — I2510 Atherosclerotic heart disease of native coronary artery without angina pectoris: Secondary | ICD-10-CM

## 2015-07-09 DIAGNOSIS — I214 Non-ST elevation (NSTEMI) myocardial infarction: Secondary | ICD-10-CM | POA: Diagnosis not present

## 2015-07-09 DIAGNOSIS — E785 Hyperlipidemia, unspecified: Secondary | ICD-10-CM

## 2015-07-09 DIAGNOSIS — Z9889 Other specified postprocedural states: Secondary | ICD-10-CM

## 2015-07-09 DIAGNOSIS — I6529 Occlusion and stenosis of unspecified carotid artery: Secondary | ICD-10-CM

## 2015-07-09 DIAGNOSIS — J441 Chronic obstructive pulmonary disease with (acute) exacerbation: Secondary | ICD-10-CM

## 2015-07-09 DIAGNOSIS — I10 Essential (primary) hypertension: Secondary | ICD-10-CM | POA: Diagnosis not present

## 2015-07-09 DIAGNOSIS — I200 Unstable angina: Secondary | ICD-10-CM

## 2015-07-09 DIAGNOSIS — E1342 Other specified diabetes mellitus with diabetic polyneuropathy: Secondary | ICD-10-CM

## 2015-07-09 NOTE — Progress Notes (Signed)
Patient ID: JALEXIA LALLI, female    DOB: [birth year], 75 y.o.   MRN: [id number]  HPI Comments: Ms. Biby is a 75 year old woman with a history of  COPD with ongoing tobacco use, stroke, hypertension, diabetes, CVA and hyperlipidemia. H/o chest pain with minimal nonobstructive coronary artery disease by catheterization on May 08, 2004, repeat catheterization September 2012 showing 50% LAD disease, 60% RCA disease, cardiac catheterization May 2015 showing no significant CAD who presents for follow-up of her shortness of breath, chest pain history Difficult to control diabetes, was in the hospital October 2016 with sugars of 600  In follow-up today, she reports that she continues to smoke, She does report having chest pain sometimes it wakes her from sleep, takes nitroglycerin and symptoms resolve No significant symptoms on exertion such as doing chores around the house or shopping Recently got over bronchitis, COPD exacerbation, was taking antibiotics and prednisone Reports having poorly controlled sugars, was in the hospital October 2016 with sugars in the 600 range At that time she had normal echocardiogram and stress test, results reviewed with her in detail Hemoglobin A1c January 2017 was 10.4 Continued chronic shortness of breath on exertion  Carotid ultrasound reviewed from July 2016 with her showing 50-69% stenosis on the right, greater than 70% disease on the left. Since then she has had carotid endarterectomy on the left  EKG on today's visit shows no sinus rhythm with rate 98 bpm, no significant ST or T-wave changes  Other past medical history Prior catheterization in 2015 showed no significant CAD. There is no evidence of previously seen moderate disease in the LAD or RCA. left arm symptoms were from musculoskeletal etiology, unable to exclude arthritis in her neck and DJD.  She has a long history of chronic shortness of breath likely secondary to COPD She continues to have  problems with diabetes control. No lightheadedness, PND or orthopnea. ACE inhibitor held by pulmonary for possible cough.   previous Total cholesterol 210, LDL 130, HDL 43  Allergies  Allergen Reactions  . Coconut Fatty Acids Swelling    Throat swells    Current Outpatient Prescriptions on File Prior to Visit  Medication Sig Dispense Refill  . albuterol (PROVENTIL) (5 MG/ML) 0.5% nebulizer solution Take 2.5 mg by nebulization 2 (two) times daily.    Marland Kitchen amitriptyline (ELAVIL) 10 MG tablet Take 0.5 tablets (5 mg total) by mouth at bedtime. 90 tablet 2  . aspirin 81 MG tablet Take 81 mg by mouth every morning.     Marland Kitchen atorvastatin (LIPITOR) 20 MG tablet Take 1 tablet by mouth at bedtime.    . Azelastine-Fluticasone (DYMISTA) 137-50 MCG/ACT SUSP Place 1 spray into the nose daily.    . Blood Glucose Monitoring Suppl (ONE TOUCH ULTRA SYSTEM KIT) w/Device KIT Needs one touch ultra 2 1 each 0  . budesonide-formoterol (SYMBICORT) 160-4.5 MCG/ACT inhaler Inhale 2 puffs into the lungs 2 (two) times daily. 1 Inhaler 0  . clopidogrel (PLAVIX) 75 MG tablet Take 1 tablet (75 mg total) by mouth daily. 30 tablet 6  . diazepam (VALIUM) 5 MG tablet Take 1 tablet (5 mg total) by mouth every 6 (six) hours as needed for muscle spasms. 50 tablet 1  . gabapentin (NEURONTIN) 600 MG tablet Take 1 tablet by mouth two  times daily 180 tablet 3  . glimepiride (AMARYL) 4 MG tablet Take 1 tablet by mouth two  times daily 180 tablet 3  . glucose blood test strip Check sugar once daily. DX E11.9-  needs strips and lancets please to fit one touch ultra 2 meter 50 each 12  . isosorbide mononitrate (IMDUR) 30 MG 24 hr tablet Take 1 tablet by mouth  daily 90 tablet 3  . losartan-hydrochlorothiazide (HYZAAR) 100-25 MG per tablet Take 1 tablet by mouth  daily 90 tablet 3  . magnesium oxide (MAG-OX) 400 (241.3 MG) MG tablet Take 1 tablet by mouth 2 (two) times daily.    . metFORMIN (GLUCOPHAGE) 1000 MG tablet Take 1,000 mg by  mouth at bedtime.     . Multiple Vitamins-Minerals (ICAPS) CAPS Take 2 capsules by mouth daily. Reported on 04/17/2015    . naproxen sodium (ANAPROX) 220 MG tablet Take 220 mg by mouth as needed. Takes twice a week as needed.    . nitroGLYCERIN (NITROSTAT) 0.4 MG SL tablet Place 1 tablet (0.4 mg total) under the tongue every 5 (five) minutes as needed for chest pain. 25 tablet 3  . omeprazole (PRILOSEC) 20 MG capsule Take 1 capsule (20 mg total) by mouth every morning. 90 capsule 2  . oxyCODONE-acetaminophen (PERCOCET/ROXICET) 5-325 MG per tablet Take 1-2 tablets by mouth every 6 (six) hours as needed for severe pain. 30 tablet 0  . oxymetazoline (AFRIN) 0.05 % nasal spray Place 1 spray into both nostrils 2 (two) times daily.    . pioglitazone (ACTOS) 30 MG tablet Take 1 tablet (30 mg total) by mouth daily. 90 tablet 3  . potassium chloride (K-DUR) 10 MEQ tablet Take 3 tablets by mouth  daily as directed 270 tablet 1  . Tiotropium Bromide Monohydrate (SPIRIVA RESPIMAT) 2.5 MCG/ACT AERS Inhale 1 puff into the lungs daily. 12 g 3   No current facility-administered medications on file prior to visit.    Past Medical History  Diagnosis Date  . Coronary artery disease, non-occlusive     a. cath 2006: min nonobs CAD; b. cath 12/2010: cath LAD 50%, RCA 60%; c. 08/2013: Minimal luminal irregs, right dominant system with no significant CAD, diffuse luminal irregs noted. Normal EF 55%, no AS or MS.   Marland Kitchen Hypertension   . Diabetes mellitus   . Hyperlipemia     Followed by Dr. Rosanna Randy  . Stroke ([place])   . COPD (chronic obstructive pulmonary disease) ([place])   . Shortness of breath   . Pneumonia     hx  . Macular degeneration     rt  . COPD (chronic obstructive pulmonary disease) [facility])     Past Surgical History  Procedure Laterality Date  . Cardiac catheterization  05/2004  . Cholecystectomy    . Vesicovaginal fistula closure w/ tah    . Cataract extraction Left   . Back surgery  80's  . Anterior  cervical decomp/discectomy fusion N/A 10/19/2013    Procedure: CERVICAL FIVE-SIX ANTERIOR CERVICAL DECOMPRESSION WITH FUSION INTERBODY PROSTHESIS PLATING AND PEEK CAGE;  Surgeon: Ophelia Charter, MD;  Location: [place] [facility];  Service: Neurosurgery;  Laterality: N/A;  . Breast cyst excision Left     left negative   . Tonsillectomy and adenoidectomy    . Abdominal hysterectomy    . Endarterectomy Left 10/24/2014    Procedure: ENDARTERECTOMY CAROTID;  Surgeon: Algernon Huxley, MD;  Location: [facility] ORS;  Service: Vascular;  Laterality: Left;    Social History  reports that she has been smoking Cigarettes.  She has a 13.5 pack-year smoking history. She has never used smokeless tobacco. She reports that she does not drink alcohol or use illicit drugs.  Family History family  history includes Heart attack in her brother, brother, father, and paternal grandmother; Lung cancer in her maternal grandfather; Stroke in her mother.  Review of Systems  Constitutional: Negative.   HENT: Negative.   Eyes: Negative.   Respiratory: Positive for shortness of breath.   Gastrointestinal: Negative.   Endocrine: Negative.   Musculoskeletal: Negative.   Skin: Negative.   Allergic/Immunologic: Negative.   Neurological: Negative.   Hematological: Negative.   Psychiatric/Behavioral: Negative.   All other systems reviewed and are negative.    BP 106/56 mmHg  Pulse 98  Ht '5\' 4"'  (1.626 m)  Wt 155 lb 8 oz (70.534 kg)  BMI 26.68 kg/m2  Physical Exam  Constitutional: She is oriented to person, place, and time. She appears well-developed and well-nourished.  HENT:  Head: Normocephalic.  Nose: Nose normal.  Mouth/Throat: Oropharynx is clear and moist.  Eyes: Conjunctivae are normal. Pupils are equal, round, and reactive to light.  Neck: Normal range of motion. Neck supple. No JVD present.  Cardiovascular: Normal rate, regular rhythm, S1 normal, S2 normal, normal heart sounds and intact distal pulses.  Exam  reveals no gallop and no friction rub.   No murmur heard. Pulmonary/Chest: Effort normal. No respiratory distress. She has decreased breath sounds. She has no wheezes. She has no rales. She exhibits no tenderness.  Abdominal: Soft. Bowel sounds are normal. She exhibits no distension. There is no tenderness.  Musculoskeletal: Normal range of motion. She exhibits no edema or tenderness.  Lymphadenopathy:    She has no cervical adenopathy.  Neurological: She is alert and oriented to person, place, and time. Coordination normal.  Skin: Skin is warm and dry. No rash noted. No erythema.  Psychiatric: She has a normal mood and affect. Her behavior is normal. Judgment and thought content normal.    Assessment and Plan  Nursing note and vitals reviewed.

## 2015-07-09 NOTE — Assessment & Plan Note (Signed)
Stressed the importance of staying on her Lipitor 20 g daily, goal LDL less than 70 If repeat lipid panel confirm she's not at goal, will need to increase her dose

## 2015-07-09 NOTE — Patient Instructions (Signed)
You are doing well.  Try the isosorbide at night for chest pain If continue to have symptoms, you could try 1 1/2 pills of the isosorbide  We will schedule a carotid ultrasound in late 08/2015 for CEA, stenosis  Please call us if you have new issues that need to be addressed before your next appt.  Your physician wants you to follow-up in: 6 months.  You will receive a reminder letter in the mail two months in advance. If you don't receive a letter, please call our office to schedule the follow-up appointment.

## 2015-07-09 NOTE — Assessment & Plan Note (Signed)
Carotid ultrasound results from 2016 discussed with her from before the carotid endarterectomy Recommended repeat carotid ultrasounds this year to monitor disease on the right

## 2015-07-09 NOTE — Assessment & Plan Note (Signed)
Severely controlled diabetes, hospital admission October 2016, We have encouraged continued exercise, careful diet management , compliance with her medication, close follow-up with primary care   Total encounter time more than 25 minutes  Greater than 50% was spent in counseling and coordination of care with the patient

## 2015-07-09 NOTE — Assessment & Plan Note (Signed)
Blood pressure running low, recommended she take her isosorbide at night, losartan in the morning

## 2015-07-09 NOTE — Assessment & Plan Note (Signed)
She reports having COPD exacerbation recently requiring antibiotics, steroids Feeling back to her baseline

## 2015-07-09 NOTE — Assessment & Plan Note (Signed)
Recent episodes of chest pain at rest, waking her from sleep, none on exertion Prior cardiac catheterization with no obstructive disease Recommend she call our office if symptoms get worse, particularly if she has symptoms on exertion For now will watch her. Recommended she try her isosorbide at nighttime

## 2015-07-12 ENCOUNTER — Ambulatory Visit: Payer: Medicare Other

## 2015-07-12 DIAGNOSIS — I214 Non-ST elevation (NSTEMI) myocardial infarction: Secondary | ICD-10-CM

## 2015-07-12 DIAGNOSIS — I6523 Occlusion and stenosis of bilateral carotid arteries: Secondary | ICD-10-CM | POA: Diagnosis not present

## 2015-07-12 DIAGNOSIS — I2510 Atherosclerotic heart disease of native coronary artery without angina pectoris: Secondary | ICD-10-CM

## 2015-07-12 DIAGNOSIS — Z9889 Other specified postprocedural states: Secondary | ICD-10-CM

## 2015-07-12 DIAGNOSIS — I6529 Occlusion and stenosis of unspecified carotid artery: Secondary | ICD-10-CM

## 2015-07-12 DIAGNOSIS — I10 Essential (primary) hypertension: Secondary | ICD-10-CM

## 2015-07-27 DIAGNOSIS — J449 Chronic obstructive pulmonary disease, unspecified: Secondary | ICD-10-CM | POA: Diagnosis not present

## 2015-08-12 ENCOUNTER — Other Ambulatory Visit: Payer: Self-pay | Admitting: Family Medicine

## 2015-08-12 ENCOUNTER — Ambulatory Visit
Admission: RE | Admit: 2015-08-12 | Discharge: 2015-08-12 | Disposition: A | Discharge status: Home / Self Care | Payer: Medicare Other | Source: Ambulatory Visit | Attending: Family Medicine | Admitting: Family Medicine

## 2015-08-12 DIAGNOSIS — Z1231 Encounter for screening mammogram for malignant neoplasm of breast: Secondary | ICD-10-CM | POA: Diagnosis not present

## 2015-08-19 ENCOUNTER — Encounter: Payer: Self-pay | Admitting: Family Medicine

## 2015-08-19 ENCOUNTER — Ambulatory Visit (INDEPENDENT_AMBULATORY_CARE_PROVIDER_SITE_OTHER): Payer: Medicare Other | Admitting: Family Medicine

## 2015-08-19 VITALS — BP 124/58 | HR 90 | Temp 98.1°F | Resp 18 | Wt 158.0 lb

## 2015-08-19 DIAGNOSIS — I10 Essential (primary) hypertension: Secondary | ICD-10-CM | POA: Diagnosis not present

## 2015-08-19 DIAGNOSIS — E785 Hyperlipidemia, unspecified: Secondary | ICD-10-CM

## 2015-08-19 DIAGNOSIS — Z87891 Personal history of nicotine dependence: Secondary | ICD-10-CM | POA: Diagnosis not present

## 2015-08-19 DIAGNOSIS — E1342 Other specified diabetes mellitus with diabetic polyneuropathy: Secondary | ICD-10-CM

## 2015-08-19 DIAGNOSIS — J441 Chronic obstructive pulmonary disease with (acute) exacerbation: Secondary | ICD-10-CM | POA: Diagnosis not present

## 2015-08-19 LAB — POCT UA - MICROALBUMIN: MICROALBUMIN (UR) POC: 20 mg/L

## 2015-08-19 NOTE — Progress Notes (Signed)
Patient ID: [name] [name], female   DOB: 09/17/1940, 75 y.o.   MRN: [id number]    Subjective:  HPI  Patient is here for follow up. LOV for Diabetes, HTN check up was in January.  Diabetes: patient was put on Actos on her last visit. Her fasting sugar vary from 136 to 230. Lab Results  Component Value Date   HGBA1C 10.4 05/06/2015   But patient states that [facility] [employer] nurse came out in April and checked her A1C and it was around 9.2, we do not have those records yet.  patient says she has quit smoking. Prior to Admission medications   Medication Sig Start Date End Date Taking? Authorizing Provider  albuterol (PROVENTIL) (5 MG/ML) 0.5% nebulizer solution Take 2.5 mg by nebulization 2 (two) times daily.   Yes Historical Provider, MD  amitriptyline (ELAVIL) 10 MG tablet Take 0.5 tablets (5 mg total) by mouth at bedtime. 10/30/14  Yes Orla Estrin Maceo Pro., MD  aspirin 81 MG tablet Take 81 mg by mouth every morning.    Yes Historical Provider, MD  atorvastatin (LIPITOR) 20 MG tablet Take 1 tablet by mouth at bedtime. 07/22/14  Yes Historical Provider, MD  Azelastine-Fluticasone (DYMISTA) 137-50 MCG/ACT SUSP Place 1 spray into the nose daily.   Yes Historical Provider, MD  Blood Glucose Monitoring Suppl (ONE TOUCH ULTRA SYSTEM KIT) w/Device KIT Needs one touch ultra 2 06/26/15  Yes Ryah Cribb Maceo Pro., MD  budesonide-formoterol [facility]) 160-4.5 MCG/ACT inhaler Inhale 2 puffs into the lungs 2 (two) times daily. 12/06/14  Yes [name], MD  clopidogrel (PLAVIX) 75 MG tablet Take 1 tablet (75 mg total) by mouth daily. 10/25/14  Yes Algernon Huxley, MD  diazepam (VALIUM) 5 MG tablet Take 1 tablet (5 mg total) by mouth every 6 (six) hours as needed for muscle spasms. 10/20/13  Yes Newman Pies, MD  gabapentin (NEURONTIN) 600 MG tablet Take 1 tablet by mouth two  times daily 06/06/15  Yes Lovelle Lema Maceo Pro., MD  glimepiride (AMARYL) 4 MG tablet Take 1 tablet by mouth two  times daily 04/18/15   Yes Dari Carpenito Maceo Pro., MD  glucose blood test strip Check sugar once daily. DX E11.9- needs strips and lancets please to fit one touch ultra 2 meter 06/26/15  Yes Quinnley Colasurdo Maceo Pro., MD  isosorbide mononitrate (IMDUR) 30 MG 24 hr tablet Take 1 tablet by mouth  daily 07/23/14  Yes [name], MD  losartan-hydrochlorothiazide [facility]) 100-25 MG per tablet Take 1 tablet by mouth  daily 11/12/14  Yes Jerrol Banana., MD  magnesium oxide (MAG-OX) 400 (241.3 MG) MG tablet Take 1 tablet by mouth 2 (two) times daily. 08/04/13  Yes Historical Provider, MD  metFORMIN (GLUCOPHAGE) 1000 MG tablet Take 1,000 mg by mouth at bedtime.    Yes Historical Provider, MD  Multiple Vitamins-Minerals (ICAPS) CAPS Take 2 capsules by mouth daily. Reported on 04/17/2015   Yes Historical Provider, MD  naproxen sodium (ANAPROX) 220 MG tablet Take 220 mg by mouth as needed. Takes twice a week as needed.   Yes Historical Provider, MD  nitroGLYCERIN (NITROSTAT) 0.4 MG SL tablet Place 1 tablet (0.4 mg total) under the tongue every 5 (five) minutes as needed for chest pain. 07/23/14  Yes [name], MD  omeprazole (PRILOSEC) 20 MG capsule Take 1 capsule (20 mg total) by mouth every morning. 10/30/14  Yes Kania Regnier Maceo Pro., MD  oxyCODONE-acetaminophen (PERCOCET/ROXICET) 5-325 MG per tablet Take 1-2 tablets  by mouth every 6 (six) hours as needed for severe pain. 10/25/14  Yes Algernon Huxley, MD  oxymetazoline (AFRIN) 0.05 % nasal spray Place 1 spray into both nostrils 2 (two) times daily.   Yes Historical Provider, MD  pioglitazone (ACTOS) 30 MG tablet Take 1 tablet (30 mg total) by mouth daily. 05/06/15  Yes Blanche Gallien Maceo Pro., MD  potassium chloride (K-DUR) 10 MEQ tablet Take 3 tablets by mouth  daily as directed 01/11/15  Yes Darcy Barbara Maceo Pro., MD  Tiotropium Bromide Monohydrate (SPIRIVA RESPIMAT) 2.5 MCG/ACT AERS Inhale 1 puff into the lungs daily. 12/06/14  Yes [name], MD    Patient Active Problem List    Diagnosis Date Noted  . NSTEMI (non-ST elevated myocardial infarction) ([place]) 01/27/2015  . Chronic vulvitis 09/26/2014  . Allergic reaction 09/26/2014  . Carotid stenosis 08/30/2014  . Cervical nerve root disorder 08/10/2014  . Absolute anemia 08/10/2014  . CAD in native artery 08/10/2014  . B12 deficiency 08/10/2014  . Back ache 08/10/2014  . Bronchitis, chronic ([place]) 08/10/2014  . Diabetes mellitus with polyneuropathy ([place]) 08/10/2014  . Can't get food down 08/10/2014  . Eczema of external ear 08/10/2014  . Accumulation of fluid in tissues 08/10/2014  . Gout 08/10/2014  . Adult hypothyroidism 08/10/2014  . Mononeuritis 08/10/2014  . Muscle ache 08/10/2014  . Disorder of peripheral nervous system ([place]) 08/10/2014  . Lesion of vulva 08/10/2014  . Cervical spondylosis with radiculopathy 10/19/2013  . Unstable angina ([place]) 08/15/2013  . COPD exacerbation ([place]) 03/29/2013  . CAD (coronary artery disease) 06/22/2011  . COPD (chronic obstructive pulmonary disease) with emphysema ([place]) 03/07/2010  . CHEST PAIN UNSPECIFIED 07/22/2009  . HLD (hyperlipidemia) 04/01/2009  . Malaise and fatigue 04/01/2009  . Hyperlipidemia 01/18/2009  . TOBACCO ABUSE 01/18/2009  . HYPERTENSION, BENIGN 01/18/2009  . CLAUDICATION 01/18/2009  . Pain in limb 01/18/2009  . CAFL (chronic airflow limitation) ([place]) 01/20/2007  . Late effects of cerebrovascular disease 01/10/2007  . Essential (primary) hypertension 12/22/2006    Past Medical History  Diagnosis Date  . Coronary artery disease, non-occlusive     a. cath 2006: min nonobs CAD; b. cath 12/2010: cath LAD 50%, RCA 60%; c. 08/2013: Minimal luminal irregs, right dominant system with no significant CAD, diffuse luminal irregs noted. Normal EF 55%, no AS or MS.   Marland Kitchen Hypertension   . Diabetes mellitus   . Hyperlipemia     Followed by Dr. Rosanna Randy  . Stroke ([place])   . COPD (chronic obstructive pulmonary disease) ([place])   . Shortness of breath   .  Pneumonia     hx  . Macular degeneration     rt  . COPD (chronic obstructive pulmonary disease) ([place])     Social History   Social History  . Marital Status: Married    Spouse Name: N/A  . Number of Children: N/A  . Years of Education: N/A   Occupational History  . Retired, Environmental consultant    Social History Main Topics  . Smoking status: Current Every Day Smoker -- 0.25 packs/day for 54 years    Types: Cigarettes  . Smokeless tobacco: Never Used     Comment: smokes 3 cigs daily 05/06/15. Pt instructed to quit.  . Alcohol Use: No  . Drug Use: No  . Sexual Activity: No   Other Topics Concern  . Not on file   Social History Narrative   Married with 4 children   Gets regular exercise  Allergies  Allergen Reactions  . Coconut Fatty Acids Swelling    Throat swells    Review of Systems  Constitutional: Positive for malaise/fatigue.  Respiratory: Positive for cough and shortness of breath.   Cardiovascular: Negative.   Musculoskeletal: Positive for joint pain.  Neurological: Negative.     Immunization History  Administered Date(s) Administered  . Influenza Split 02/29/2012  . Influenza Whole 12/05/2009, 12/10/2010  . Influenza, High Dose Seasonal PF 01/01/2015  . Influenza,inj,Quad PF,36+ Mos 01/04/2013  . Influenza-Unspecified 02/04/2014  . Pneumococcal Conjugate-13 02/29/2012  . Pneumococcal Polysaccharide-23 01/05/2008  . Tdap 01/05/2012   Objective:  BP 124/58 mmHg  Pulse 90  Temp(Src) 98.1 F (36.7 C)  Resp 18  Wt 158 lb (71.668 kg)  SpO2 95%  Physical Exam  Constitutional: She is oriented to person, place, and time and well-developed, well-nourished, and in no distress.  HENT:  Head: Normocephalic and atraumatic.  Eyes: Conjunctivae are normal. Pupils are equal, round, and reactive to light.  Neck: Normal range of motion. Neck supple.  Cardiovascular: Normal rate, regular rhythm, normal heart sounds and intact distal pulses.   No murmur  heard. Pulmonary/Chest: Effort normal and breath sounds normal. No respiratory distress. She has no wheezes.  Musculoskeletal: She exhibits no edema or tenderness.  Neurological: She is alert and oriented to person, place, and time.  Psychiatric: Mood, memory, affect and judgment normal.    Lab Results  Component Value Date   WBC 8.9 01/29/2015   HGB 12.4 01/29/2015   HCT 36.8 01/29/2015   PLT 167 01/29/2015   GLUCOSE 186* 02/07/2015   CHOL 195 08/30/2014   TRIG 197* 08/30/2014   HDL 47 08/30/2014   LDLCALC 109* 08/30/2014   INR 0.89 01/27/2015   HGBA1C 10.4 05/06/2015    CMP     Component Value Date/Time   NA 139 02/07/2015 1526   NA 140 01/29/2015 0553   K 4.6 02/07/2015 1526   K 3.9 12/16/2011 1232   CL 95* 02/07/2015 1526   CO2 25 02/07/2015 1526   GLUCOSE 186* 02/07/2015 1526   GLUCOSE 176* 01/29/2015 0553   BUN 15 02/07/2015 1526   BUN 18 01/29/2015 0553   CREATININE 1.05* 02/07/2015 1526   CALCIUM 9.3 02/07/2015 1526   PROT 6.7 08/02/2008 2232   ALBUMIN 4.3 02/07/2015 1526   ALBUMIN 4.2 08/02/2008 2232   AST 10 08/02/2008 2232   ALT 10 08/02/2008 2232   ALKPHOS 77 08/02/2008 2232   BILITOT 0.3 08/02/2008 2232   GFRNONAA 53* 02/07/2015 1526   GFRAA 61 02/07/2015 1526    Assessment and Plan :  1. Diabetic polyneuropathy associated with other specified diabetes mellitus ([place]) A1C was around 9.2 per patient with [place] [facility] house calls. Stable. Continue current medication. - POCT UA - Microalbumin  on ARB RTC 3 months. 2. Essential hypertension Stable. On ARB for renal protection  3. Former tobacco use Patient states she quit.  4. COPD exacerbation ([place])  advised patient to not restart smoking. 5. Hyperlipidemia  Patient was seen and examined by Dr. Eulas Post and note was scribed by Theressa Millard, [place].    Miguel Aschoff MD [facility] 08/19/2015 1:45 PM

## 2015-08-20 LAB — COMPREHENSIVE METABOLIC PANEL
A/G RATIO: 1.6 (ref 1.2–2.2)
ALT: 11 IU/L (ref 0–32)
AST: 15 IU/L (ref 0–40)
Albumin: 4.2 g/dL (ref 3.5–4.8)
Alkaline Phosphatase: 95 IU/L (ref 39–117)
BILIRUBIN TOTAL: 0.2 mg/dL (ref 0.0–1.2)
BUN/Creatinine Ratio: 17 (ref 12–28)
BUN: 19 mg/dL (ref 8–27)
CHLORIDE: 94 mmol/L — AB (ref 96–106)
CO2: 30 mmol/L — ABNORMAL HIGH (ref 18–29)
Calcium: 9.5 mg/dL (ref 8.7–10.3)
Creatinine, Ser: 1.09 mg/dL — ABNORMAL HIGH (ref 0.57–1.00)
GFR calc non Af Amer: 50 mL/min/{1.73_m2} — ABNORMAL LOW (ref >59)
GFR, EST AFRICAN AMERICAN: 58 mL/min/{1.73_m2} — AB (ref >59)
Globulin, Total: 2.6 g/dL (ref 1.5–4.5)
Glucose: 234 mg/dL — ABNORMAL HIGH (ref 65–99)
POTASSIUM: 4.4 mmol/L (ref 3.5–5.2)
Sodium: 141 mmol/L (ref 134–144)
Total Protein: 6.8 g/dL (ref 6.0–8.5)

## 2015-08-20 LAB — CBC WITH DIFFERENTIAL/PLATELET
BASOS: 0 %
Basophils Absolute: 0 10*3/uL (ref 0.0–0.2)
EOS (ABSOLUTE): 0.1 10*3/uL (ref 0.0–0.4)
EOS: 1 %
HEMATOCRIT: 40.1 % (ref 34.0–46.6)
HEMOGLOBIN: 12.9 g/dL (ref 11.1–15.9)
IMMATURE GRANS (ABS): 0 10*3/uL (ref 0.0–0.1)
IMMATURE GRANULOCYTES: 0 %
LYMPHS: 18 %
Lymphocytes Absolute: 1.4 10*3/uL (ref 0.7–3.1)
MCH: 30.3 pg (ref 26.6–33.0)
MCHC: 32.2 g/dL (ref 31.5–35.7)
MCV: 94 fL (ref 79–97)
MONOCYTES: 10 %
MONOS ABS: 0.8 10*3/uL (ref 0.1–0.9)
NEUTROS PCT: 71 %
Neutrophils Absolute: 5.5 10*3/uL (ref 1.4–7.0)
PLATELETS: 299 10*3/uL (ref 150–379)
RBC: 4.26 x10E6/uL (ref 3.77–5.28)
RDW: 15.4 % (ref 12.3–15.4)
WBC: 7.8 10*3/uL (ref 3.4–10.8)

## 2015-08-20 LAB — LIPID PANEL WITH LDL/HDL RATIO
Cholesterol, Total: 186 mg/dL (ref 100–199)
HDL: 55 mg/dL (ref >39)
LDL Calculated: 86 mg/dL (ref 0–99)
LDl/HDL Ratio: 1.6 ratio units (ref 0.0–3.2)
TRIGLYCERIDES: 226 mg/dL — AB (ref 0–149)
VLDL Cholesterol Cal: 45 mg/dL — ABNORMAL HIGH (ref 5–40)

## 2015-08-20 LAB — TSH: TSH: 2.2 u[IU]/mL (ref 0.450–4.500)

## 2015-08-21 ENCOUNTER — Ambulatory Visit: Payer: Medicare Other | Admitting: Family Medicine

## 2015-08-26 ENCOUNTER — Ambulatory Visit: Payer: Self-pay | Admitting: Cardiovascular Disease

## 2015-08-26 DIAGNOSIS — J449 Chronic obstructive pulmonary disease, unspecified: Secondary | ICD-10-CM | POA: Diagnosis not present

## 2015-08-27 DIAGNOSIS — M545 Low back pain: Secondary | ICD-10-CM | POA: Diagnosis not present

## 2015-08-27 DIAGNOSIS — M542 Cervicalgia: Secondary | ICD-10-CM | POA: Diagnosis not present

## 2015-08-30 ENCOUNTER — Ambulatory Visit: Payer: Self-pay | Admitting: Cardiovascular Disease

## 2015-09-03 DIAGNOSIS — H353211 Exudative age-related macular degeneration, right eye, with active choroidal neovascularization: Secondary | ICD-10-CM | POA: Diagnosis not present

## 2015-09-03 LAB — HM DIABETES EYE EXAM

## 2015-09-04 ENCOUNTER — Other Ambulatory Visit: Payer: Self-pay | Admitting: Family Medicine

## 2015-09-04 ENCOUNTER — Other Ambulatory Visit: Payer: Self-pay | Admitting: Cardiovascular Disease

## 2015-09-13 ENCOUNTER — Telehealth: Payer: Self-pay | Admitting: Internal Medicine

## 2015-09-13 DIAGNOSIS — J449 Chronic obstructive pulmonary disease, unspecified: Secondary | ICD-10-CM

## 2015-09-13 NOTE — Telephone Encounter (Signed)
Pt is going out of town and will be needing 2 portable oxygen tanks  Needs them by 09/18/15 Please advise. Last time they brought it a week later.

## 2015-09-13 NOTE — Telephone Encounter (Signed)
Spoke with pt and have placed order for small portable tanks. Nothing further needed.

## 2015-09-26 DIAGNOSIS — J449 Chronic obstructive pulmonary disease, unspecified: Secondary | ICD-10-CM | POA: Diagnosis not present

## 2015-10-03 ENCOUNTER — Ambulatory Visit (INDEPENDENT_AMBULATORY_CARE_PROVIDER_SITE_OTHER): Payer: Medicare Other | Admitting: *Deleted

## 2015-10-03 DIAGNOSIS — J449 Chronic obstructive pulmonary disease, unspecified: Secondary | ICD-10-CM

## 2015-10-03 DIAGNOSIS — R0600 Dyspnea, unspecified: Secondary | ICD-10-CM

## 2015-10-03 LAB — PULMONARY FUNCTION TEST
DL/VA % PRED: 90 %
DL/VA: 4.36 ml/min/mmHg/L
DLCO UNC % PRED: 61 %
DLCO UNC: 14.84 ml/min/mmHg
FEF 25-75 POST: 0.31 L/s
FEF 25-75 Pre: 0.77 L/sec
FEF2575-%Change-Post: -59 %
FEF2575-%PRED-POST: 18 %
FEF2575-%PRED-PRE: 44 %
FEV1-%CHANGE-POST: -21 %
FEV1-%Pred-Post: 39 %
FEV1-%Pred-Pre: 50 %
FEV1-Post: 0.84 L
FEV1-Pre: 1.08 L
FEV1FVC-%CHANGE-POST: -7 %
FEV1FVC-%Pred-Pre: 95 %
FEV6-%CHANGE-POST: -15 %
FEV6-%PRED-PRE: 55 %
FEV6-%Pred-Post: 46 %
FEV6-PRE: 1.5 L
FEV6-Post: 1.28 L
FEV6FVC-%PRED-PRE: 105 %
FEV6FVC-%Pred-Post: 105 %
FVC-%Change-Post: -15 %
FVC-%PRED-POST: 44 %
FVC-%Pred-Pre: 52 %
FVC-POST: 1.28 L
FVC-Pre: 1.5 L
POST FEV6/FVC RATIO: 100 %
PRE FEV6/FVC RATIO: 100 %
Post FEV1/FVC ratio: 66 %
Pre FEV1/FVC ratio: 72 %
RV % pred: 149 %
RV: 3.38 L
TLC % pred: 103 %
TLC: 5.24 L

## 2015-10-03 NOTE — Progress Notes (Signed)
PFT performed today. 

## 2015-10-03 NOTE — Progress Notes (Signed)
SMW performed today. 

## 2015-10-10 ENCOUNTER — Other Ambulatory Visit: Payer: Self-pay | Admitting: Family Medicine

## 2015-10-10 ENCOUNTER — Ambulatory Visit (INDEPENDENT_AMBULATORY_CARE_PROVIDER_SITE_OTHER): Payer: Medicare Other | Admitting: Internal Medicine

## 2015-10-10 ENCOUNTER — Encounter: Payer: Self-pay | Admitting: Internal Medicine

## 2015-10-10 VITALS — BP 120/82 | HR 96 | Ht 64.0 in | Wt 158.0 lb

## 2015-10-10 DIAGNOSIS — J449 Chronic obstructive pulmonary disease, unspecified: Secondary | ICD-10-CM | POA: Diagnosis not present

## 2015-10-10 NOTE — Progress Notes (Signed)
   Subjective:    Patient ID: [name] [name], female    DOB: [birth year], 75 y.o.   MRN: [id number]  HPI/follow up COPD/Chronic SOB  The patient comes in today for follow-up of her known COPD. She is staying on her bronchodilator regimen, and has not had a recent acute exacerbation.  She states that she stopped smoking 1 week ago, patient has nocturnal oxygen ast 3 L [place].  6MWT shows hypoxia with exertion aftre 945 feet with o2 sat 75% She has no new complaints today No signs of infection at this time  I have dicussed lung cancer screening and recommend CT chest -she is ok with this   BP 120/82 mmHg  Pulse 96  Ht '5\' 4"'$  (1.626 m)  Wt 158 lb (71.668 kg)  BMI 27.11 kg/m2  SpO2 97%    Review of Systems  Constitutional: Negative for fever and unexpected weight change.  Respiratory: Positive for shortness of breath. Negative for cough, chest tightness and wheezing.   Cardiovascular: Negative for chest pain, palpitations and leg swelling.  Gastrointestinal: Negative for nausea and vomiting.  All other systems reviewed and are negative.      Objective:   Physical Exam  Constitutional: She is oriented to person, place, and time. She appears well-developed and well-nourished. No distress.  Neck: Neck supple.  Cardiovascular: Normal rate, regular rhythm and normal heart sounds.   No murmur heard. Pulmonary/Chest: Effort normal and breath sounds normal. No respiratory distress. She has no wheezes. She has no rales.  Musculoskeletal: She exhibits no edema.  Neurological: She is alert and oriented to person, place, and time.  Skin: Skin is warm and dry. She is not diaphoretic.    PFT 09/2015 Ratio 72%, Fev1 50% Fef25/75 44% Impression: small obstructive airways disease     Assessment & Plan:   74 yo white female seen today for follow up COPD-Her COPD is stable at this time Gold stage C with chronic resp failure,  Patient responding well to inhaler regimen. Patient is at high  risk for lung cancer  No signs of exacerbation at this time  1.continue spiriva, continue albuterol as needed 2.continue symbicort 3.continue oxygen at night,  4.smoking cessation strongly advised 5.will need 2 L [place] oxygen with exertion 6.patient has agreed for lung cancer screening will provide information   The Patient requires high complexity decision making for assessment and support, frequent evaluation and titration of therapies. Patient satisfied with plan of action and management   Yanai Hobson Patricia Pesa, M.D.  [facility]  Medical Director [place] Director [facility] Cardio-Pulmonary Department

## 2015-10-10 NOTE — Patient Instructions (Signed)
1.assess for portable oxygen concentrator, needs oxygen with exertion 2.stop smoking 3.continue oxygen at night 4.continue inhalers as prescribed   Chronic Obstructive Pulmonary Disease Chronic obstructive pulmonary disease (COPD) is a common lung condition in which airflow from the lungs is limited. COPD is a general term that can be used to describe many different lung problems that limit airflow, including both chronic bronchitis and emphysema. If you have COPD, your lung function will probably never return to normal, but there are measures you can take to improve lung function and make yourself feel better. CAUSES   Smoking (common).  Exposure to secondhand smoke.  Genetic problems.  Chronic inflammatory lung diseases or recurrent infections. SYMPTOMS  Shortness of breath, especially with physical activity.  Deep, persistent (chronic) cough with a large amount of thick mucus.  Wheezing.  Rapid breaths (tachypnea).  Gray or bluish discoloration (cyanosis) of the skin, especially in your fingers, toes, or lips.  Fatigue.  Weight loss.  Frequent infections or episodes when breathing symptoms become much worse (exacerbations).  Chest tightness. DIAGNOSIS Your health care provider will take a medical history and perform a physical examination to diagnose COPD. Additional tests for COPD may include:  Lung (pulmonary) function tests.  Chest X-ray.  CT scan.  Blood tests. TREATMENT  Treatment for COPD may include:  Inhaler and nebulizer medicines. These help manage the symptoms of COPD and make your breathing more comfortable.  Supplemental oxygen. Supplemental oxygen is only helpful if you have a low oxygen level in your blood.  Exercise and physical activity. These are beneficial for nearly all people with COPD.  Lung surgery or transplant.  Nutrition therapy to gain weight, if you are underweight.  Pulmonary rehabilitation. This may involve working with a  team of health care providers and specialists, such as respiratory, occupational, and physical therapists. HOME CARE INSTRUCTIONS  Take all medicines (inhaled or pills) as directed by your health care provider.  Avoid over-the-counter medicines or cough syrups that dry up your airway (such as antihistamines) and slow down the elimination of secretions unless instructed otherwise by your health care provider.  If you are a smoker, the most important thing that you can do is stop smoking. Continuing to smoke will cause further lung damage and breathing trouble. Ask your health care provider for help with quitting smoking. He or she can direct you to community resources or hospitals that provide support.  Avoid exposure to irritants such as smoke, chemicals, and fumes that aggravate your breathing.  Use oxygen therapy and pulmonary rehabilitation if directed by your health care provider. If you require home oxygen therapy, ask your health care provider whether you should purchase a pulse oximeter to measure your oxygen level at home.  Avoid contact with individuals who have a contagious illness.  Avoid extreme temperature and humidity changes.  Eat healthy foods. Eating smaller, more frequent meals and resting before meals may help you maintain your strength.  Stay active, but balance activity with periods of rest. Exercise and physical activity will help you maintain your ability to do things you want to do.  Preventing infection and hospitalization is very important when you have COPD. Make sure to receive all the vaccines your health care provider recommends, especially the pneumococcal and influenza vaccines. Ask your health care provider whether you need a pneumonia vaccine.  Learn and use relaxation techniques to manage stress.  Learn and use controlled breathing techniques as directed by your health care provider. Controlled breathing techniques include:  Pursed  lip breathing. Start by  breathing in (inhaling) through your nose for 1 second. Then, purse your lips as if you were going to whistle and breathe out (exhale) through the pursed lips for 2 seconds.  Diaphragmatic breathing. Start by putting one hand on your abdomen just above your waist. Inhale slowly through your nose. The hand on your abdomen should move out. Then purse your lips and exhale slowly. You should be able to feel the hand on your abdomen moving in as you exhale.  Learn and use controlled coughing to clear mucus from your lungs. Controlled coughing is a series of short, progressive coughs. The steps of controlled coughing are: 1. Lean your head slightly forward. 2. Breathe in deeply using diaphragmatic breathing. 3. Try to hold your breath for 3 seconds. 4. Keep your mouth slightly open while coughing twice. 5. Spit any mucus out into a tissue. 6. Rest and repeat the steps once or twice as needed. SEEK MEDICAL CARE IF:  You are coughing up more mucus than usual.  There is a change in the color or thickness of your mucus.  Your breathing is more labored than usual.  Your breathing is faster than usual. SEEK IMMEDIATE MEDICAL CARE IF:  You have shortness of breath while you are resting.  You have shortness of breath that prevents you from:  Being able to talk.  Performing your usual physical activities.  You have chest pain lasting longer than 5 minutes.  Your skin color is more cyanotic than usual.  You measure low oxygen saturations for longer than 5 minutes with a pulse oximeter. MAKE SURE YOU:  Understand these instructions.  Will watch your condition.  Will get help right away if you are not doing well or get worse.   This information is not intended to replace advice given to you by your health care provider. Make sure you discuss any questions you have with your health care provider.   Document Released: 12/31/2004 Document Revised: 04/13/2014 Document Reviewed:  11/17/2012 Elsevier Interactive Patient Education [employer].

## 2015-10-11 ENCOUNTER — Telehealth: Payer: Self-pay | Admitting: *Deleted

## 2015-10-11 NOTE — Telephone Encounter (Signed)
Received referral for initial lung cancer screening scan. Contacted patient and obtained smoking history,(current, 50 pack year) as well as answering questions related to screening process. Patient denies signs of lung cancer such as weight loss or hemoptysis. Patient denies comorbidity that would prevent curative treatment if lung cancer were found. Patient is tentatively scheduled for shared decision making visit and CT scan on 10/15/15 at 3:30pm, pending insurance approval from business office.

## 2015-10-15 ENCOUNTER — Encounter: Payer: Self-pay | Admitting: Family Medicine

## 2015-10-15 ENCOUNTER — Ambulatory Visit
Admission: RE | Admit: 2015-10-15 | Discharge: 2015-10-15 | Disposition: A | Discharge status: Home / Self Care | Payer: Medicare Other | Source: Ambulatory Visit | Attending: Family Medicine | Admitting: Family Medicine

## 2015-10-15 ENCOUNTER — Inpatient Hospital Stay: Payer: Medicare Other | Attending: Family Medicine | Admitting: Family Medicine

## 2015-10-15 ENCOUNTER — Other Ambulatory Visit: Payer: Self-pay | Admitting: Family Medicine

## 2015-10-15 DIAGNOSIS — Z87891 Personal history of nicotine dependence: Secondary | ICD-10-CM

## 2015-10-15 DIAGNOSIS — Z122 Encounter for screening for malignant neoplasm of respiratory organs: Secondary | ICD-10-CM

## 2015-10-15 DIAGNOSIS — F17210 Nicotine dependence, cigarettes, uncomplicated: Secondary | ICD-10-CM | POA: Diagnosis not present

## 2015-10-15 DIAGNOSIS — R911 Solitary pulmonary nodule: Secondary | ICD-10-CM | POA: Insufficient documentation

## 2015-10-15 HISTORY — DX: Personal history of nicotine dependence: Z87.891

## 2015-10-15 NOTE — Progress Notes (Signed)
In accordance with CMS guidelines, patient has meet eligibility criteria including age, absence of signs or symptoms of lung cancer, the specific calculation of cigarette smoking pack-years was 50 years and is a current smoker.   A shared decision-making session was conducted prior to the performance of CT scan. This includes one or more decision aids, includes benefits and harms of screening, follow-up diagnostic testing, over-diagnosis, false positive rate, and total radiation exposure.  Counseling on the importance of adherence to annual lung cancer LDCT screening, impact of co-morbidities, and ability or willingness to undergo diagnosis and treatment is imperative for compliance of the program.  Counseling on the importance of continued smoking cessation for former smokers; the importance of smoking cessation for current smokers and information about tobacco cessation interventions have been given to patient including the [place] at [facility], [address] quit [place], as well as [place] specific smoking cessation programs.  Written order for lung cancer screening with LDCT has been given to the patient and any and all questions have been answered to the best of my abilities.   Yearly follow up will be scheduled by Burgess Estelle, Thoracic Navigator.

## 2015-10-16 ENCOUNTER — Telehealth: Payer: Self-pay | Admitting: *Deleted

## 2015-10-16 NOTE — Telephone Encounter (Signed)
Notified patient of LDCT lung cancer screening results with recommendation for 3 month follow up imaging vs. more urgent evaluation. Will review and discuss CT scan in multidisciplinary thoracic conference tomorrow. Patient verbalizes understanding and is in agreement with plan.

## 2015-10-17 ENCOUNTER — Encounter: Payer: Self-pay | Admitting: Family Medicine

## 2015-10-17 ENCOUNTER — Other Ambulatory Visit: Payer: Self-pay | Admitting: Family Medicine

## 2015-10-17 DIAGNOSIS — Z87891 Personal history of nicotine dependence: Secondary | ICD-10-CM

## 2015-10-17 DIAGNOSIS — R918 Other nonspecific abnormal finding of lung field: Secondary | ICD-10-CM | POA: Insufficient documentation

## 2015-10-17 HISTORY — DX: Other nonspecific abnormal finding of lung field: R91.8

## 2015-10-18 ENCOUNTER — Other Ambulatory Visit: Payer: Self-pay | Admitting: *Deleted

## 2015-10-18 ENCOUNTER — Other Ambulatory Visit: Payer: Self-pay | Admitting: Family Medicine

## 2015-10-18 DIAGNOSIS — R911 Solitary pulmonary nodule: Secondary | ICD-10-CM

## 2015-10-23 ENCOUNTER — Ambulatory Visit
Admission: RE | Admit: 2015-10-23 | Discharge: 2015-10-23 | Disposition: A | Discharge status: Home / Self Care | Payer: Medicare Other | Source: Ambulatory Visit | Attending: Internal Medicine | Admitting: Internal Medicine

## 2015-10-23 DIAGNOSIS — R590 Localized enlarged lymph nodes: Secondary | ICD-10-CM | POA: Insufficient documentation

## 2015-10-23 DIAGNOSIS — R911 Solitary pulmonary nodule: Secondary | ICD-10-CM | POA: Diagnosis not present

## 2015-10-23 DIAGNOSIS — D3502 Benign neoplasm of left adrenal gland: Secondary | ICD-10-CM | POA: Insufficient documentation

## 2015-10-23 DIAGNOSIS — D3501 Benign neoplasm of right adrenal gland: Secondary | ICD-10-CM | POA: Diagnosis not present

## 2015-10-23 DIAGNOSIS — I700 Atherosclerosis of aorta: Secondary | ICD-10-CM | POA: Insufficient documentation

## 2015-10-23 LAB — GLUCOSE, CAPILLARY: Glucose-Capillary: 137 mg/dL — ABNORMAL HIGH (ref 65–99)

## 2015-10-23 MED ORDER — FLUDEOXYGLUCOSE F - 18 (FDG) INJECTION
12.5800 | Freq: Once | INTRAVENOUS | Status: AC | PRN
  Administered 2015-10-23: 12.58 via INTRAVENOUS

## 2015-10-24 DIAGNOSIS — J449 Chronic obstructive pulmonary disease, unspecified: Secondary | ICD-10-CM | POA: Diagnosis not present

## 2015-10-24 DIAGNOSIS — R0602 Shortness of breath: Secondary | ICD-10-CM | POA: Diagnosis not present

## 2015-10-25 ENCOUNTER — Telehealth: Payer: Self-pay | Admitting: Internal Medicine

## 2015-10-25 NOTE — Telephone Encounter (Signed)
Tried to call pt to make sure she is referring to the PET scan. Line was busy. Please advise on PET scan.

## 2015-10-25 NOTE — Telephone Encounter (Signed)
Per DK, pt needs to be scheduled for a f/u appt to go over PET scan.   Spoke with pt and scheduled an appt for 10/28/15 @ 12pm. Nothing further needed.

## 2015-10-25 NOTE — Telephone Encounter (Signed)
Pt would like test results. Please call.

## 2015-10-26 DIAGNOSIS — J449 Chronic obstructive pulmonary disease, unspecified: Secondary | ICD-10-CM | POA: Diagnosis not present

## 2015-10-28 ENCOUNTER — Encounter: Payer: Self-pay | Admitting: Internal Medicine

## 2015-10-28 ENCOUNTER — Ambulatory Visit (INDEPENDENT_AMBULATORY_CARE_PROVIDER_SITE_OTHER): Payer: Medicare Other | Admitting: Internal Medicine

## 2015-10-28 ENCOUNTER — Telehealth: Payer: Self-pay

## 2015-10-28 VITALS — BP 130/68 | HR 77 | Ht 64.0 in | Wt 159.0 lb

## 2015-10-28 DIAGNOSIS — R918 Other nonspecific abnormal finding of lung field: Secondary | ICD-10-CM

## 2015-10-28 NOTE — Progress Notes (Signed)
   Subjective:    Patient ID: [name] [name], female    DOB: 1940/08/03, 75 y.o.   MRN: [id number]  HPI/follow up COPD/Chronic SOB  The patient comes in today for follow-up of her known COPD. Follow up CT chest and PET scan +RLL nodule with adenopathy-likely malignant She is staying on her bronchodilator regimen, and has not had a recent acute exacerbation.  patient has nocturnal oxygen ast 3 L [place].  6MWT shows hypoxia with exertion aftre 945 feet with o2 sat 75% She has no new complaints today No signs of infection at this time  I have discussed CT PET results with patient and family   BP 130/68 (BP Location: Left Arm, Cuff Size: Normal)   Pulse 77   Ht '5\' 4"'$  (1.626 m)   Wt 159 lb (72.1 kg)   SpO2 93%   BMI 27.29 kg/m     Review of Systems  Constitutional: Negative for fever and unexpected weight change.  Respiratory: Positive for shortness of breath. Negative for cough, chest tightness and wheezing.   Cardiovascular: Negative for chest pain, palpitations and leg swelling.  Gastrointestinal: Negative for nausea and vomiting.  All other systems reviewed and are negative.      Objective:   Physical Exam  Constitutional: She is oriented to person, place, and time. She appears well-developed and well-nourished. No distress.  Neck: Neck supple.  Cardiovascular: Normal rate, regular rhythm and normal heart sounds.   No murmur heard. Pulmonary/Chest: Effort normal and breath sounds normal. No respiratory distress. She has no wheezes. She has no rales.  Musculoskeletal: She exhibits no edema.  Neurological: She is alert and oriented to person, place, and time.  Skin: Skin is warm and dry. She is not diaphoretic.    PFT 09/2015 Ratio 72%, Fev1 50% Fef25/75 44% Impression: small obstructive airways disease  Ct chest and PET scan reviewed with patient. RLL nodule with mediastinal adenopathy    Assessment & Plan:   75 yo white female seen today for follow up COPD-Her COPD  is stable at this time Gold stage C with chronic hypoxic resp failure,  Patient responding well to inhaler regimen. CT PET scan findings suggest primary lung cancer with adenopathy  No signs of exacerbation at this time  1.continue spiriva, continue albuterol as needed 2.continue symbicort 3.continue oxygen at night,  4.smoking cessation strongly advised 5.will need 2 L [place] oxygen with exertion 6.plan for cardiology referral regarding plavix therapy and cardiology clearance for EBUS 7.plan for EBUS   The Risks and Benefits of the Bronchoscopy with EBUS were explained to patient/family and I have discussed the risk for acute bleeding, increased chance of infection, increased chance of respiratory failure and cardiac arrest and death. I have also explained to avoid all types of NSAIDs to decrease chance of bleeding, and to avoid food and drinks the midnight prior to procedure.  The patient/family understand the risks and benefits and have agreed to proceed with procedure.    The Patient requires high complexity decision making for assessment and support, frequent evaluation and titration of therapies. Patient satisfied with plan of action and management   Mahalia Dykes Patricia Pesa, M.D.  [facility]  Medical Director [place] Director [employer] Cardio-Pulmonary Department

## 2015-10-28 NOTE — Patient Instructions (Signed)
Continue inhalers as prescibed Need to see Dr Rockey Situ for cardiology clearance(ask about plavix) Continue oxygen Will need preop assessment Plan for EBUS  Chronic Obstructive Pulmonary Disease Chronic obstructive pulmonary disease (COPD) is a common lung condition in which airflow from the lungs is limited. COPD is a general term that can be used to describe many different lung problems that limit airflow, including both chronic bronchitis and emphysema. If you have COPD, your lung function will probably never return to normal, but there are measures you can take to improve lung function and make yourself feel better. CAUSES   Smoking (common).  Exposure to secondhand smoke.  Genetic problems.  Chronic inflammatory lung diseases or recurrent infections. SYMPTOMS  Shortness of breath, especially with physical activity.  Deep, persistent (chronic) cough with a large amount of thick mucus.  Wheezing.  Rapid breaths (tachypnea).  Gray or bluish discoloration (cyanosis) of the skin, especially in your fingers, toes, or lips.  Fatigue.  Weight loss.  Frequent infections or episodes when breathing symptoms become much worse (exacerbations).  Chest tightness. DIAGNOSIS Your health care provider will take a medical history and perform a physical examination to diagnose COPD. Additional tests for COPD may include:  Lung (pulmonary) function tests.  Chest X-ray.  CT scan.  Blood tests. TREATMENT  Treatment for COPD may include:  Inhaler and nebulizer medicines. These help manage the symptoms of COPD and make your breathing more comfortable.  Supplemental oxygen. Supplemental oxygen is only helpful if you have a low oxygen level in your blood.  Exercise and physical activity. These are beneficial for nearly all people with COPD.  Lung surgery or transplant.  Nutrition therapy to gain weight, if you are underweight.  Pulmonary rehabilitation. This may involve working with  a team of health care providers and specialists, such as respiratory, occupational, and physical therapists. HOME CARE INSTRUCTIONS  Take all medicines (inhaled or pills) as directed by your health care provider.  Avoid over-the-counter medicines or cough syrups that dry up your airway (such as antihistamines) and slow down the elimination of secretions unless instructed otherwise by your health care provider.  If you are a smoker, the most important thing that you can do is stop smoking. Continuing to smoke will cause further lung damage and breathing trouble. Ask your health care provider for help with quitting smoking. He or she can direct you to community resources or hospitals that provide support.  Avoid exposure to irritants such as smoke, chemicals, and fumes that aggravate your breathing.  Use oxygen therapy and pulmonary rehabilitation if directed by your health care provider. If you require home oxygen therapy, ask your health care provider whether you should purchase a pulse oximeter to measure your oxygen level at home.  Avoid contact with individuals who have a contagious illness.  Avoid extreme temperature and humidity changes.  Eat healthy foods. Eating smaller, more frequent meals and resting before meals may help you maintain your strength.  Stay active, but balance activity with periods of rest. Exercise and physical activity will help you maintain your ability to do things you want to do.  Preventing infection and hospitalization is very important when you have COPD. Make sure to receive all the vaccines your health care provider recommends, especially the pneumococcal and influenza vaccines. Ask your health care provider whether you need a pneumonia vaccine.  Learn and use relaxation techniques to manage stress.  Learn and use controlled breathing techniques as directed by your health care provider. Controlled breathing techniques  include:  Pursed lip breathing. Start  by breathing in (inhaling) through your nose for 1 second. Then, purse your lips as if you were going to whistle and breathe out (exhale) through the pursed lips for 2 seconds.  Diaphragmatic breathing. Start by putting one hand on your abdomen just above your waist. Inhale slowly through your nose. The hand on your abdomen should move out. Then purse your lips and exhale slowly. You should be able to feel the hand on your abdomen moving in as you exhale.  Learn and use controlled coughing to clear mucus from your lungs. Controlled coughing is a series of short, progressive coughs. The steps of controlled coughing are: 1. Lean your head slightly forward. 2. Breathe in deeply using diaphragmatic breathing. 3. Try to hold your breath for 3 seconds. 4. Keep your mouth slightly open while coughing twice. 5. Spit any mucus out into a tissue. 6. Rest and repeat the steps once or twice as needed. SEEK MEDICAL CARE IF:  You are coughing up more mucus than usual.  There is a change in the color or thickness of your mucus.  Your breathing is more labored than usual.  Your breathing is faster than usual. SEEK IMMEDIATE MEDICAL CARE IF:  You have shortness of breath while you are resting.  You have shortness of breath that prevents you from:  Being able to talk.  Performing your usual physical activities.  You have chest pain lasting longer than 5 minutes.  Your skin color is more cyanotic than usual.  You measure low oxygen saturations for longer than 5 minutes with a pulse oximeter. MAKE SURE YOU:  Understand these instructions.  Will watch your condition.  Will get help right away if you are not doing well or get worse.   This information is not intended to replace advice given to you by your health care provider. Make sure you discuss any questions you have with your health care provider.   Document Released: 12/31/2004 Document Revised: 04/13/2014 Document Reviewed:  11/17/2012 Elsevier Interactive Patient Education [employer].

## 2015-10-28 NOTE — Telephone Encounter (Signed)
Pt aware of scheduled appt for 10/29/15 with Christell Faith for cardiology clearance for EBUS. Pt voices understanding. Nothing further needed.

## 2015-10-29 ENCOUNTER — Encounter: Payer: Self-pay | Admitting: Physician Assistant

## 2015-10-29 ENCOUNTER — Ambulatory Visit (INDEPENDENT_AMBULATORY_CARE_PROVIDER_SITE_OTHER): Payer: Medicare Other | Admitting: Physician Assistant

## 2015-10-29 VITALS — BP 140/60 | HR 67 | Resp 94 | Ht 64.0 in | Wt 161.5 lb

## 2015-10-29 DIAGNOSIS — I10 Essential (primary) hypertension: Secondary | ICD-10-CM

## 2015-10-29 DIAGNOSIS — I6523 Occlusion and stenosis of bilateral carotid arteries: Secondary | ICD-10-CM

## 2015-10-29 DIAGNOSIS — Z01810 Encounter for preprocedural cardiovascular examination: Secondary | ICD-10-CM

## 2015-10-29 DIAGNOSIS — J438 Other emphysema: Secondary | ICD-10-CM | POA: Diagnosis not present

## 2015-10-29 DIAGNOSIS — E785 Hyperlipidemia, unspecified: Secondary | ICD-10-CM

## 2015-10-29 DIAGNOSIS — E1165 Type 2 diabetes mellitus with hyperglycemia: Secondary | ICD-10-CM | POA: Diagnosis not present

## 2015-10-29 DIAGNOSIS — I2510 Atherosclerotic heart disease of native coronary artery without angina pectoris: Secondary | ICD-10-CM

## 2015-10-29 NOTE — Patient Instructions (Signed)
Medication Instructions:  Please STOP Plavix  Labwork: None  Testing/Procedures: None  Follow-Up: Your physician wants you to follow-up in: 6 months.  You will receive a reminder letter in the mail two months in advance.  If you don't receive a letter, please call our office to schedule the follow-up appointment.  If you need a refill on your cardiac medications before your next appointment, please call your pharmacy.

## 2015-10-29 NOTE — Progress Notes (Signed)
Cardiology Office Note Date:  10/29/2015  Patient ID:  [name] [name], [name] [name] 1940/06/12, MRN [id number] PCP:  [name], MD  Cardiologist:  Dr. Rockey Situ, MD    Chief Complaint: Cardiac clearance   History of Present Illness: [name] [name] is a 75 y.o. female with history of nonobstrctive CAD by most recent cardiac cath in 08/2013, COPD with ongoing tobacco abuse and chronic SOB with long history of nighttime oxygen a 3 L [place] now needing during the day at 2 L [place], stroke, HTN, poorly controlled DM2 with prior blood sugar in the 22's from 01/2015, and HLD who presents for surgical cardiac clearance. She underwent cardiac cath on 05/08/2004 in the setting of chest pain that showed minimal nonobstructive CAD. Repeat cardiac cath in 12/2010 showed 50% LAD disease and 60% RCA disease. She most recently underwent cardiac cath in 08/2013 that showed no significant CAD without evidence of previously seen moderate disease in the LAD or RCA. Her symptoms were felt to be MSK vs neck arthritis/DJD. Prior carotid ultrasound from 10/2014 showed 50-69% stenosis in the RICA and greater than 40% of the LICA s/p carotid endarterectomy. At her last follow up on 07/09/15 with Dr. Rockey Situ, MD she repoerted continuing to smoke with chest pains that sometimes wake her up at nighttime and resolve with SL NTG. She had also just gotten over an episode of bronchitis/COPD exacerbation s/p ABX and steroids. At that time she underwent nuclear stress testing on 01/29/2015 that showed no evidence of ischemia, EF 61%, low risk study. Echo on 01/28/2015 showed EF of 55-60%, normal wall motion, normal LV diastolic function, mild MR. Given her multiple nonobstructive cardiac caths and recent low risk nuclear stress she was advised to monitor chest pain and call if symtpoms become exertional.   She is having a lung biopsy for RLL nodule with adenopathy seen on imaging felt to be malignant.   She has not had any symptoms concerning for  angina. She quit smoking, "for good" on 10/03/2015. Weight is increased slightly, though she has been eating better since quitting smoking. No early satiety or LE edema. No recent stenting or intervention.    Past Medical History:  Diagnosis Date  . Abnormal CT lung screening 10/17/2015  . COPD (chronic obstructive pulmonary disease) ([place])   . Coronary artery disease, non-occlusive    a. cath 2006: min nonobs CAD; b. cath 12/2010: cath LAD 50%, RCA 60%; c. 08/2013: Minimal luminal irregs, right dominant system with no significant CAD, diffuse luminal irregs noted. Normal EF 55%, no AS or MS.   . Diabetes mellitus   . Hyperlipemia    Followed by Dr. Rosanna Randy  . Hypertension   . Macular degeneration    rt  . Personal history of tobacco use, presenting hazards to health 10/15/2015  . Pneumonia    hx  . Shortness of breath   . Stroke [facility])     Past Surgical History:  Procedure Laterality Date  . ABDOMINAL HYSTERECTOMY    . ANTERIOR CERVICAL DECOMP/DISCECTOMY FUSION N/A 10/19/2013   Procedure: CERVICAL FIVE-SIX ANTERIOR CERVICAL DECOMPRESSION WITH FUSION INTERBODY PROSTHESIS PLATING AND PEEK CAGE;  Surgeon: Ophelia Charter, MD;  Location: [place] NEURO ORS;  Service: Neurosurgery;  Laterality: N/A;  . BACK SURGERY  80's  . BREAST CYST EXCISION Left    left negative   . CARDIAC CATHETERIZATION  05/2004  . CATARACT EXTRACTION Left   . CHOLECYSTECTOMY    . ENDARTERECTOMY Left 10/24/2014   Procedure: ENDARTERECTOMY  CAROTID;  Surgeon: Algernon Huxley, MD;  Location: [facility] ORS;  Service: Vascular;  Laterality: Left;  . TONSILLECTOMY AND ADENOIDECTOMY    . VESICOVAGINAL FISTULA CLOSURE W/ TAH      Current Outpatient Prescriptions  Medication Sig Dispense Refill  . albuterol (PROVENTIL) (5 MG/ML) 0.5% nebulizer solution Take 2.5 mg by nebulization 2 (two) times daily.    Marland Kitchen aspirin 81 MG tablet Take 81 mg by mouth every morning.     Marland Kitchen atorvastatin (LIPITOR) 20 MG tablet Take 1 tablet by mouth at   bedtime 90 tablet 3  . Azelastine-Fluticasone (DYMISTA) 137-50 MCG/ACT SUSP Place 1 spray into the nose daily.    . Blood Glucose Monitoring Suppl (ONE TOUCH ULTRA SYSTEM KIT) w/Device KIT Needs one touch ultra 2 1 each 0  . budesonide-formoterol (SYMBICORT) 160-4.5 MCG/ACT inhaler Inhale 2 puffs into the lungs 2 (two) times daily. 1 Inhaler 0  . clopidogrel (PLAVIX) 75 MG tablet Take 1 tablet (75 mg total) by mouth daily. 30 tablet 6  . diazepam (VALIUM) 5 MG tablet Take 1 tablet (5 mg total) by mouth every 6 (six) hours as needed for muscle spasms. 50 tablet 1  . gabapentin (NEURONTIN) 600 MG tablet Take 1 tablet by mouth two  times daily 180 tablet 3  . glimepiride (AMARYL) 4 MG tablet Take 1 tablet by mouth two  times daily 180 tablet 3  . glucose blood test strip Check sugar once daily. DX E11.9- needs strips and lancets please to fit one touch ultra 2 meter 50 each 12  . isosorbide mononitrate (IMDUR) 30 MG 24 hr tablet Take 1 tablet by mouth  daily 90 tablet 3  . losartan-hydrochlorothiazide (HYZAAR) 100-25 MG tablet Take 1 tablet by mouth  daily 90 tablet 3  . magnesium oxide (MAG-OX) 400 (241.3 MG) MG tablet Take 1 tablet by mouth 2 (two) times daily.    . metFORMIN (GLUCOPHAGE) 1000 MG tablet Take 1 tablet by mouth  every day 90 tablet 3  . Multiple Vitamins-Minerals (ICAPS) CAPS Take 2 capsules by mouth daily. Reported on 04/17/2015    . naproxen sodium (ANAPROX) 220 MG tablet Take 220 mg by mouth as needed. Takes twice a week as needed.    . nitroGLYCERIN (NITROSTAT) 0.4 MG SL tablet Place 1 tablet (0.4 mg total) under the tongue every 5 (five) minutes as needed for chest pain. 25 tablet 3  . omeprazole (PRILOSEC) 20 MG capsule Take 1 capsule (20 mg total) by mouth every morning. 90 capsule 2  . oxymetazoline (AFRIN) 0.05 % nasal spray Place 1 spray into both nostrils 2 (two) times daily.    . pantoprazole (PROTONIX) 40 MG tablet Take 1 tablet by mouth  daily 90 tablet 3  . pioglitazone  (ACTOS) 30 MG tablet Take 1 tablet (30 mg total) by mouth daily. 90 tablet 3  . potassium chloride (K-DUR) 10 MEQ tablet Take 3 tablets by mouth  daily as directed 270 tablet 1  . Tiotropium Bromide Monohydrate (SPIRIVA RESPIMAT) 2.5 MCG/ACT AERS Inhale 1 puff into the lungs daily. 12 g 3   No current facility-administered medications for this visit.     Allergies:   Coconut fatty acids   Social History:  The patient  reports that she has been smoking Cigarettes.  She has a 50.00 pack-year smoking history. She has never used smokeless tobacco. She reports that she does not drink alcohol or use drugs.   Family History:  The patient's family history includes Heart attack  in her brother, brother, father, and paternal grandmother; Lung cancer in her maternal grandfather; Stroke in her mother.  ROS:   Review of Systems  Constitutional: Positive for malaise/fatigue. Negative for chills, diaphoresis, fever and weight loss.  HENT: Negative for congestion.   Eyes: Negative for discharge and redness.  Respiratory: Positive for shortness of breath. Negative for cough, sputum production and wheezing.   Cardiovascular: Negative for chest pain, palpitations, orthopnea, claudication, leg swelling and PND.  Gastrointestinal: Negative for abdominal pain, heartburn, nausea and vomiting.  Musculoskeletal: Negative for falls and myalgias.  Skin: Negative for rash.  Neurological: Negative for dizziness, tingling, tremors, sensory change, speech change, focal weakness, loss of consciousness and weakness.  Endo/Heme/Allergies: Does not bruise/bleed easily.  Psychiatric/Behavioral: Negative for substance abuse. The patient is not nervous/anxious.   All other systems reviewed and are negative.    PHYSICAL EXAM:  VS:  BP 140/60 (BP Location: Left Arm, Patient Position: Sitting, Cuff Size: Normal)   Pulse 67   Resp (!) 94   Ht _0  (1.626 m)   Wt 161 lb 8 oz (73.3 kg)   BMI 27.72 kg/m  BMI: Body mass index  is 27.72 kg/m.  Physical Exam  Constitutional: She is oriented to person, place, and time. She appears well-developed and well-nourished.  HENT:  Head: Normocephalic and atraumatic.  Eyes: Right eye exhibits no discharge. Left eye exhibits no discharge.  Neck: Normal range of motion. No JVD present.  Cardiovascular: Normal rate, regular rhythm, S1 normal, S2 normal and normal heart sounds.  Exam reveals no distant heart sounds, no friction rub, no midsystolic click and no opening snap.   No murmur heard. Pulmonary/Chest: Effort normal and breath sounds normal. No respiratory distress. She has no decreased breath sounds. She has no wheezes. She has no rales. She exhibits no tenderness.  Abdominal: Soft. She exhibits no distension. There is no tenderness.  Musculoskeletal: She exhibits no edema.  Neurological: She is alert and oriented to person, place, and time.  Skin: Skin is warm and dry. No cyanosis. Nails show no clubbing.  Psychiatric: She has a normal mood and affect. Her speech is normal and behavior is normal. Judgment and thought content normal.     EKG:  Was ordered and interpreted by me today. Shows NSR, short PR interval at 108 msec, frequent PVCs, no acute st/t changes   Recent Labs: 01/29/2015: Hemoglobin 12.4 08/19/2015: ALT 11; BUN 19; Creatinine, Ser 1.09; Platelets 299; Potassium 4.4; Sodium 141; TSH 2.200  08/19/2015: Cholesterol, Total 186; HDL 55; LDL Calculated 86; Triglycerides 226   CrCl cannot be calculated (Patient's most recent lab result is older than the maximum 21 days allowed.).   Wt Readings from Last 3 Encounters:  10/29/15 161 lb 8 oz (73.3 kg)  10/28/15 159 lb (72.1 kg)  10/15/15 157 lb (71.2 kg)     Other studies reviewed: Additional studies/records reviewed today include: summarized above  ASSESSMENT AND PLAN:  1. Cardiac surgical clearance: She is cleared from a cardiac standpoint to have lung biopsy. She is stable from a cardiac standpoint.  Hold Plavix for procedure x 5 days and no indication for her to restart this medication if ok with vascular as she has been > 12 months since carotid intervention. Continue aspirin 81 mg.   2. Nonobstructive CAD: No symptoms concerning for angina. Multiple ischemic evaluations as above showing no significant CAD. Continue aspirin, Lipitor. No on beta blocker given breathing. No plans for further ischemic evaluation at this time.  3. Chest pain: Resolved. Felt to be MSK in etiology.   4. COPD/lung nodule with adenopathy: Now requiring 2 L [place] during the day for the past 1 week. Previously just needed oxygen at nighttime at 3 L [place]. Per pulmonary.    5. Poorly controlled DM: Per PCP.   6. HTN: Well controlled today. Continue current medications.   7. HLD: Lipitor.   8. Carotid stenosis: Status post LCIA endarterectomy. Stable.   Disposition: F/u with Dr. Rockey Situ, MD in 6 months.    Current medicines are reviewed at length with the patient today.  The patient did not have any concerns regarding medicines.  Melvern Banker PA-C 10/29/2015 2:48 PM     [address], [place] [id number] [phone]

## 2015-11-05 ENCOUNTER — Encounter
Admission: RE | Admit: 2015-11-05 | Discharge: 2015-11-05 | Disposition: A | Discharge status: Home / Self Care | Payer: Medicare Other | Source: Ambulatory Visit | Attending: Internal Medicine | Admitting: Internal Medicine

## 2015-11-05 ENCOUNTER — Telehealth: Payer: Self-pay | Admitting: *Deleted

## 2015-11-05 DIAGNOSIS — Z01812 Encounter for preprocedural laboratory examination: Secondary | ICD-10-CM | POA: Diagnosis not present

## 2015-11-05 LAB — CBC
HCT: 38.5 % (ref 35.0–47.0)
Hemoglobin: 13.4 g/dL (ref 12.0–16.0)
MCH: 32.1 pg (ref 26.0–34.0)
MCHC: 34.7 g/dL (ref 32.0–36.0)
MCV: 92.4 fL (ref 80.0–100.0)
Platelets: 231 10*3/uL (ref 150–440)
RBC: 4.17 MIL/uL (ref 3.80–5.20)
RDW: 14.2 % (ref 11.5–14.5)
WBC: 7.3 10*3/uL (ref 3.6–11.0)

## 2015-11-05 LAB — BASIC METABOLIC PANEL
Anion gap: 7 (ref 5–15)
BUN: 25 mg/dL — AB (ref 6–20)
CALCIUM: 9.2 mg/dL (ref 8.9–10.3)
CO2: 34 mmol/L — ABNORMAL HIGH (ref 22–32)
Chloride: 100 mmol/L — ABNORMAL LOW (ref 101–111)
Creatinine, Ser: 1.01 mg/dL — ABNORMAL HIGH (ref 0.44–1.00)
GFR calc Af Amer: >60 mL/min (ref >60)
GFR, EST NON AFRICAN AMERICAN: 54 mL/min — AB (ref >60)
GLUCOSE: 118 mg/dL — AB (ref 65–99)
Potassium: 4.8 mmol/L (ref 3.5–5.1)
Sodium: 141 mmol/L (ref 135–145)

## 2015-11-05 LAB — SURGICAL PCR SCREEN
MRSA, PCR: NEGATIVE
STAPHYLOCOCCUS AUREUS: POSITIVE — AB

## 2015-11-05 NOTE — Telephone Encounter (Signed)
Per DK, pt needs to stop aspirin 7 days prior to bronch. Will call pt to inform.

## 2015-11-05 NOTE — Patient Instructions (Addendum)
Your procedure is scheduled on: Thursday 11/14/15 Report to Day Surgery. 2ND FLOOR MEDICAL MALL ENTRANCE To find out your arrival time please call [phone] between 1PM - 3PM on Wednesday 11/13/15.  Remember: Instructions that are not followed completely may result in serious medical risk, up to and including death, or upon the discretion of your surgeon and anesthesiologist your surgery may need to be rescheduled.    __X__ 1. Do not eat food or drink liquids after midnight. No gum chewing or hard candies.     __X__ 2. No Alcohol for 24 hours before or after surgery.   ____ 3. Bring all medications with you on the day of surgery if instructed.    __X__ 4. Notify your doctor if there is any change in your medical condition     (cold, fever, infections).     Do not wear jewelry, make-up, hairpins, clips or nail polish.  Do not wear lotions, powders, or perfumes.   Do not shave 48 hours prior to surgery. Men may shave face and neck.  Do not bring valuables to the hospital.    [facility] is not responsible for any belongings or valuables.               Contacts, dentures or bridgework may not be worn into surgery.  Leave your suitcase in the car. After surgery it may be brought to your room.  For patients admitted to the hospital, discharge time is determined by your                treatment team.   Patients discharged the day of surgery will not be allowed to drive home.   Please read over the following fact sheets that you were given:   MRSA Information and Surgical Site Infection Prevention   __X__ Take these medicines the morning of surgery with A SIP OF WATER:    1. GABAPENTIN  2. ISOSORIDE  3. MAGNESIUM  4. PANTOPRAZOLE  5.  6.  ____ Fleet Enema (as directed)   ____ Use CHG Soap as directed  __X__ Use inhalers on the day of surgery AND NEBULIZER  ____ Stop metformin 2 days prior to surgery    ____ Take 1/2 of usual insulin dose the night before surgery and none on  the morning of surgery.   __X__ Stop Coumadin/Plavix 5 DAYS PRIOR TO SURGERY  __X__ Stop Anti-inflammatories on TODAY (NAPROXEN SODIUM)   ____ Stop supplements until after surgery.    ____ Bring C-Pap to the hospital.

## 2015-11-05 NOTE — Telephone Encounter (Signed)
Pt informed to take her aspirin on 11/06/15 but not after that due to bronch procedure. Nothing further needed.

## 2015-11-05 NOTE — Telephone Encounter (Signed)
Sherry from pre-assessment has called and states pt had cardiac clearance and was told to stop Plavix but cont Aspirin. Do you want me to have pt to stop Aspirin prior to procedure and if so how many days. EBUS is scheduled for 11/14/15. Thanks

## 2015-11-06 NOTE — Pre-Procedure Instructions (Signed)
Positive staph aureus results faxed to Dr. Zoila Shutter office.  Asked if wanted any treatment?

## 2015-11-06 NOTE — Telephone Encounter (Signed)
This encounter was created in error - please disregard.

## 2015-11-08 ENCOUNTER — Telehealth: Payer: Self-pay | Admitting: *Deleted

## 2015-11-08 NOTE — Telephone Encounter (Signed)
Pt informed and states she has some bactroban from before and states not to send in rx. Went over instructions with pt. Nothing further needed.

## 2015-11-08 NOTE — Telephone Encounter (Signed)
Per DK, Send Mupirocin to pharmacy due to pt being positive for Staff infection. Tried to call pt. No answer and no VM available will call back later.

## 2015-11-14 ENCOUNTER — Encounter
Admission: RE | Disposition: A | Discharge status: Home / Self Care | Payer: Self-pay | Source: Ambulatory Visit | Attending: Internal Medicine

## 2015-11-14 ENCOUNTER — Ambulatory Visit: Payer: Medicare Other | Admitting: Anesthesiology

## 2015-11-14 ENCOUNTER — Ambulatory Visit
Admission: RE | Admit: 2015-11-14 | Discharge: 2015-11-14 | Disposition: A | Discharge status: Home / Self Care | Payer: Medicare Other | Source: Ambulatory Visit | Attending: Internal Medicine | Admitting: Internal Medicine

## 2015-11-14 ENCOUNTER — Encounter: Payer: Self-pay | Admitting: *Deleted

## 2015-11-14 DIAGNOSIS — R591 Generalized enlarged lymph nodes: Secondary | ICD-10-CM

## 2015-11-14 DIAGNOSIS — I2510 Atherosclerotic heart disease of native coronary artery without angina pectoris: Secondary | ICD-10-CM | POA: Diagnosis not present

## 2015-11-14 DIAGNOSIS — R590 Localized enlarged lymph nodes: Secondary | ICD-10-CM | POA: Diagnosis present

## 2015-11-14 DIAGNOSIS — J449 Chronic obstructive pulmonary disease, unspecified: Secondary | ICD-10-CM | POA: Diagnosis not present

## 2015-11-14 DIAGNOSIS — Z87891 Personal history of nicotine dependence: Secondary | ICD-10-CM | POA: Insufficient documentation

## 2015-11-14 DIAGNOSIS — F419 Anxiety disorder, unspecified: Secondary | ICD-10-CM | POA: Diagnosis not present

## 2015-11-14 DIAGNOSIS — M1990 Unspecified osteoarthritis, unspecified site: Secondary | ICD-10-CM | POA: Insufficient documentation

## 2015-11-14 DIAGNOSIS — R918 Other nonspecific abnormal finding of lung field: Secondary | ICD-10-CM | POA: Diagnosis not present

## 2015-11-14 DIAGNOSIS — I252 Old myocardial infarction: Secondary | ICD-10-CM | POA: Diagnosis not present

## 2015-11-14 DIAGNOSIS — D649 Anemia, unspecified: Secondary | ICD-10-CM | POA: Diagnosis not present

## 2015-11-14 DIAGNOSIS — I739 Peripheral vascular disease, unspecified: Secondary | ICD-10-CM | POA: Insufficient documentation

## 2015-11-14 DIAGNOSIS — C969 Malignant neoplasm of lymphoid, hematopoietic and related tissue, unspecified: Secondary | ICD-10-CM | POA: Insufficient documentation

## 2015-11-14 DIAGNOSIS — Z8673 Personal history of transient ischemic attack (TIA), and cerebral infarction without residual deficits: Secondary | ICD-10-CM | POA: Insufficient documentation

## 2015-11-14 DIAGNOSIS — I10 Essential (primary) hypertension: Secondary | ICD-10-CM | POA: Insufficient documentation

## 2015-11-14 DIAGNOSIS — C771 Secondary and unspecified malignant neoplasm of intrathoracic lymph nodes: Secondary | ICD-10-CM | POA: Diagnosis not present

## 2015-11-14 DIAGNOSIS — R599 Enlarged lymph nodes, unspecified: Secondary | ICD-10-CM

## 2015-11-14 HISTORY — PX: ENDOBRONCHIAL ULTRASOUND: SHX5096

## 2015-11-14 LAB — GLUCOSE, CAPILLARY: Glucose-Capillary: 101 mg/dL — ABNORMAL HIGH (ref 65–99)

## 2015-11-14 SURGERY — ENDOBRONCHIAL ULTRASOUND (EBUS)
Anesthesia: General

## 2015-11-14 MED ORDER — SODIUM CHLORIDE 0.9 % IV SOLN
INTRAVENOUS | Status: DC
  Administered 2015-11-14: 12:00:00 via INTRAVENOUS

## 2015-11-14 MED ORDER — DEXAMETHASONE SODIUM PHOSPHATE 10 MG/ML IJ SOLN
INTRAMUSCULAR | Status: DC | PRN
  Administered 2015-11-14: 4 mg via INTRAVENOUS

## 2015-11-14 MED ORDER — PHENYLEPHRINE HCL 10 MG/ML IJ SOLN
INTRAMUSCULAR | Status: DC | PRN
  Administered 2015-11-14 (×2): 50 ug via INTRAVENOUS

## 2015-11-14 MED ORDER — ONDANSETRON HCL 4 MG/2ML IJ SOLN
INTRAMUSCULAR | Status: DC | PRN
  Administered 2015-11-14: 4 mg via INTRAVENOUS

## 2015-11-14 MED ORDER — FENTANYL CITRATE (PF) 100 MCG/2ML IJ SOLN
INTRAMUSCULAR | Status: DC | PRN
  Administered 2015-11-14 (×2): 50 ug via INTRAVENOUS

## 2015-11-14 MED ORDER — SUCCINYLCHOLINE CHLORIDE 20 MG/ML IJ SOLN
INTRAMUSCULAR | Status: DC | PRN
  Administered 2015-11-14: 80 mg via INTRAVENOUS

## 2015-11-14 MED ORDER — LIDOCAINE HCL (CARDIAC) 20 MG/ML IV SOLN
INTRAVENOUS | Status: DC | PRN
  Administered 2015-11-14: 100 mg via INTRAVENOUS

## 2015-11-14 MED ORDER — FENTANYL CITRATE (PF) 100 MCG/2ML IJ SOLN: 25.0000 ug | INTRAMUSCULAR | Status: DC | PRN

## 2015-11-14 MED ORDER — ONDANSETRON HCL 4 MG/2ML IJ SOLN: 4.0000 mg | Freq: Once | INTRAMUSCULAR | Status: DC | PRN

## 2015-11-14 MED ORDER — MIDAZOLAM HCL 5 MG/5ML IJ SOLN
INTRAMUSCULAR | Status: DC | PRN
  Administered 2015-11-14: 2 mg via INTRAVENOUS

## 2015-11-14 MED ORDER — PROPOFOL 10 MG/ML IV BOLUS
INTRAVENOUS | Status: DC | PRN
  Administered 2015-11-14: 130 mg via INTRAVENOUS
  Administered 2015-11-14: 60 mg via INTRAVENOUS

## 2015-11-14 NOTE — Anesthesia Preprocedure Evaluation (Signed)
Anesthesia Evaluation  Patient identified by MRN, date of birth, ID band Patient awake    Reviewed: Allergy & Precautions, NPO status , Patient's Chart, lab work & pertinent test results, reviewed documented beta blocker date and time   Airway Mallampati: II  TM Distance: >3 FB     Dental  (+) Upper Dentures, Lower Dentures   Pulmonary shortness of breath, pneumonia, resolved, COPD,  COPD inhaler, former smoker,           Cardiovascular hypertension, Pt. on medications + angina + CAD, + Past MI and + Peripheral Vascular Disease       Neuro/Psych PSYCHIATRIC DISORDERS Anxiety  Neuromuscular disease CVA, No Residual Symptoms    GI/Hepatic   Endo/Other  diabetes, Type 2Hypothyroidism   Renal/GU      Musculoskeletal  (+) Arthritis ,   Abdominal   Peds  Hematology  (+) anemia ,   Anesthesia Other Findings Gout. Last EF 55%.  Reproductive/Obstetrics                             Anesthesia Physical Anesthesia Plan  ASA: III  Anesthesia Plan: General   Post-op Pain Management:    Induction: Intravenous  Airway Management Planned: Oral ETT  Additional Equipment:   Intra-op Plan:   Post-operative Plan:   Informed Consent: I have reviewed the patients History and Physical, chart, labs and discussed the procedure including the risks, benefits and alternatives for the proposed anesthesia with the patient or authorized representative who has indicated his/her understanding and acceptance.     Plan Discussed with: CRNA  Anesthesia Plan Comments:         Anesthesia Quick Evaluation

## 2015-11-14 NOTE — Interval H&P Note (Signed)
History and Physical Interval Note:  11/14/2015 12:43 PM  [name] [name]  has presented today for surgery, with the diagnosis of LUNG MASS  The various methods of treatment have been discussed with the patient and family. After consideration of risks, benefits and other options for treatment, the patient has consented to  Procedure(s): ENDOBRONCHIAL ULTRASOUND (N/A) as a surgical intervention .  The patient's history has been reviewed, patient examined, no change in status, stable for surgery.  I have reviewed the patient's chart and labs.  Questions were answered to the patient's satisfaction.     [name]

## 2015-11-14 NOTE — Transfer of Care (Signed)
Immediate Anesthesia Transfer of Care Note  Patient: [name] [name]  Procedure(s) Performed: Procedure(s): ENDOBRONCHIAL ULTRASOUND (N/A)  Patient Location: PACU  Anesthesia Type:General  Level of Consciousness: sedated  Airway & Oxygen Therapy: Patient Spontanous Breathing and Patient connected to face mask oxygen  Post-op Assessment: Report given to RN and Post -op Vital signs reviewed and stable  Post vital signs: Reviewed and stable  Last Vitals:  Vitals:   11/14/15 1202  BP: 108/76  Pulse: 80  Resp: 18  Temp: 36.8 C    Last Pain:  Vitals:   11/14/15 1202  TempSrc: Oral         Complications: No apparent anesthesia complications

## 2015-11-14 NOTE — Anesthesia Procedure Notes (Signed)
Procedure Name: Intubation Performed by: Demetrius Charity Pre-anesthesia Checklist: Patient identified, Patient being monitored, Timeout performed, Emergency Drugs available and Suction available Patient Re-evaluated:Patient Re-evaluated prior to inductionOxygen Delivery Method: Circle system utilized Preoxygenation: Pre-oxygenation with 100% oxygen Intubation Type: IV induction Ventilation: Mask ventilation without difficulty Laryngoscope Size: Mac and 3 Grade View: Grade I Tube type: Oral Tube size: 8.5 mm Number of attempts: 1 Airway Equipment and Method: Stylet Placement Confirmation: ETT inserted through vocal cords under direct vision,  positive ETCO2 and breath sounds checked- equal and bilateral Secured at: 21 cm Tube secured with: Tape Dental Injury: Teeth and Oropharynx as per pre-operative assessment

## 2015-11-14 NOTE — Discharge Instructions (Signed)
Flexible Bronchoscopy, Care After Refer to this sheet in the next few weeks. These instructions provide you with information on caring for yourself after your procedure. Your health care provider may also give you more specific instructions. Your treatment has been planned according to current medical practices, but problems sometimes occur. Call your health care provider if you have any problems or questions after your procedure.  WHAT TO EXPECT AFTER THE PROCEDURE It is normal to have the following symptoms for 24-48 hours after the procedure:   Increased cough.  Low-grade fever.  Sore throat or hoarse voice.  Small streaks of blood in your thick spit (sputum) if tissue samples were taken (biopsy). HOME CARE INSTRUCTIONS   Do not eat or drink anything for 2 hours after your procedure. Your nose and throat were numbed by medicine. If you try to eat or drink before the medicine wears off, food or drink could go into your lungs or you could burn yourself. After the numbness is gone and your cough and gag reflexes have returned, you may eat soft food and drink liquids slowly.   The day after the procedure, you can go back to your normal diet.   You may resume normal activities.   Keep all follow-up visits as directed by your health care provider. It is important to keep all your appointments, especially if tissue samples were taken for testing (biopsy). SEEK IMMEDIATE MEDICAL CARE IF:   You have increasing shortness of breath.   You become light-headed or faint.   You have chest pain.   You have any new concerning symptoms.  You cough up more than a small amount of blood.  The amount of blood you cough up increases. MAKE SURE YOU:  Understand these instructions.  Will watch your condition.  Will get help right away if you are not doing well or get worse.   This information is not intended to replace advice given to you by your health care provider. Make sure you discuss  any questions you have with your health care provider.   Document Released: 10/10/2004 Document Revised: 04/13/2014 Document Reviewed: 11/25/2012 Elsevier Interactive Patient Education 2016 [place]   1) The drugs that you were given will stay in your system until tomorrow so for the next 24 hours you should not:  A) Drive an automobile B) Make any legal decisions C) Drink any alcoholic beverage   2) You may resume regular meals tomorrow.  Today it is better to start with liquids and gradually work up to solid foods.  You may eat anything you prefer, but it is better to start with liquids, then soup and crackers, and gradually work up to solid foods.   3) Please notify your doctor immediately if you have any unusual bleeding, trouble breathing, redness and pain at the surgery site, drainage, fever, or pain not relieved by medication.    4) Additional Instructions:        Please contact your physician with any problems or Same Day Surgery at [phone], Monday through Friday 6 am to 4 pm, or [place] at [facility] number at [phone].

## 2015-11-14 NOTE — Op Note (Signed)
[facility] Patient Name: [name] [name] Procedure Date: 11/14/2015 12:52 PM MRN: [id number] Account #: [id number] Date of Birth: [birth year] Admit Type: Inpatient Age: 75 Room: Bronch Suite on 2nd floor Gender: Female Note Status: Finalized Attending MD: Patricia Pesa , MD Procedure:         ENDOBRONCHIALN [place] Bronchoscopy Indications:       Mediastinal adenopathy Providers:         Patricia Pesa, MD Referring MD:       Medicines:         General Anesthesia Complications:     No immediate complications Procedure:         Pre-Anesthesia Assessment:                    - A History and Physical has been performed. The patient's                     medications, allergies and sensitivities have been                     reviewed.                    After obtaining informed consent, the bronchoscope was                     passed under direct vision. Throughout the procedure, the                     patient's blood pressure, pulse, and oxygen saturations                     were monitored continuously. the Bronchofibervideoscope                     Olympus [id number] was introduced through the                     mouth, via the endotracheal tube and advanced to the                     trachea. The procedure was accomplished without                     difficulty. The patient tolerated the procedure well. Findings:      Lymph Nodes: One abnormal lymph node was visualized in the right lower       paratracheal region (level 4R) as examined by endobronchial ultrasound       (EBUS). It measured 30 mm by 30 mm in maximal cross-sectional diameter.      Transbronchial biopsies of a lesion were performed in the right       paratracheal area using a 21 gauge needle and sent for routine cytology.       The procedure was guided by ultrasound. The sampling device penetrated       the full thickness of the bronchial wall in order to reach the sampling       site. Five biopsy  passes were performed. Five biopsy samples were       obtained. Impression:        - Mediastinal adenopathy                    - One abnormal lymph node in the right lower paratracheal  region (level 4R). The diagnosis is highly suspicious for                     malignancy.                    - No specimens collected. Recommendation:    - Await biopsy and cytology results. Patricia Pesa MD Patricia Pesa, MD 11/14/2015 1:30:07 PM This report has been signed electronically. Number of Addenda: 0 Note Initiated On: 11/14/2015 12:52 PM      [facility]

## 2015-11-14 NOTE — H&P (View-Only) (Signed)
   Subjective:    Patient ID: [name] [name], female    DOB: 10/01/1940, 75 y.o.   MRN: [id number]  HPI/follow up COPD/Chronic SOB  The patient comes in today for follow-up of her known COPD. Follow up CT chest and PET scan +RLL nodule with adenopathy-likely malignant She is staying on her bronchodilator regimen, and has not had a recent acute exacerbation.  patient has nocturnal oxygen ast 3 L [place].  6MWT shows hypoxia with exertion aftre 945 feet with o2 sat 75% She has no new complaints today No signs of infection at this time  I have discussed CT PET results with patient and family   BP 130/68 (BP Location: Left Arm, Cuff Size: Normal)   Pulse 77   Ht '5\' 4"'$  (1.626 m)   Wt 159 lb (72.1 kg)   SpO2 93%   BMI 27.29 kg/m     Review of Systems  Constitutional: Negative for fever and unexpected weight change.  Respiratory: Positive for shortness of breath. Negative for cough, chest tightness and wheezing.   Cardiovascular: Negative for chest pain, palpitations and leg swelling.  Gastrointestinal: Negative for nausea and vomiting.  All other systems reviewed and are negative.      Objective:   Physical Exam  Constitutional: She is oriented to person, place, and time. She appears well-developed and well-nourished. No distress.  Neck: Neck supple.  Cardiovascular: Normal rate, regular rhythm and normal heart sounds.   No murmur heard. Pulmonary/Chest: Effort normal and breath sounds normal. No respiratory distress. She has no wheezes. She has no rales.  Musculoskeletal: She exhibits no edema.  Neurological: She is alert and oriented to person, place, and time.  Skin: Skin is warm and dry. She is not diaphoretic.    PFT 09/2015 Ratio 72%, Fev1 50% Fef25/75 44% Impression: small obstructive airways disease  Ct chest and PET scan reviewed with patient. RLL nodule with mediastinal adenopathy    Assessment & Plan:   75 yo white female seen today for follow up COPD-Her COPD  is stable at this time Gold stage C with chronic hypoxic resp failure,  Patient responding well to inhaler regimen. CT PET scan findings suggest primary lung cancer with adenopathy  No signs of exacerbation at this time  1.continue spiriva, continue albuterol as needed 2.continue symbicort 3.continue oxygen at night,  4.smoking cessation strongly advised 5.will need 2 L [place] oxygen with exertion 6.plan for cardiology referral regarding plavix therapy and cardiology clearance for EBUS 7.plan for EBUS   The Risks and Benefits of the Bronchoscopy with EBUS were explained to patient/family and I have discussed the risk for acute bleeding, increased chance of infection, increased chance of respiratory failure and cardiac arrest and death. I have also explained to avoid all types of NSAIDs to decrease chance of bleeding, and to avoid food and drinks the midnight prior to procedure.  The patient/family understand the risks and benefits and have agreed to proceed with procedure.    The Patient requires high complexity decision making for assessment and support, frequent evaluation and titration of therapies. Patient satisfied with plan of action and management   [name] Patricia Pesa, M.D.  [facility]  Medical Director [place] Director [facility] Cardio-Pulmonary Department

## 2015-11-15 LAB — CYTOLOGY - NON PAP

## 2015-11-15 NOTE — Anesthesia Postprocedure Evaluation (Signed)
Anesthesia Post Note  Patient: [name] [name]  Procedure(s) Performed: Procedure(s) (LRB): ENDOBRONCHIAL ULTRASOUND (N/A)  Patient location during evaluation: PACU Anesthesia Type: General Level of consciousness: awake and alert Pain management: pain level controlled Vital Signs Assessment: post-procedure vital signs reviewed and stable Respiratory status: spontaneous breathing, nonlabored ventilation, respiratory function stable and patient connected to nasal cannula oxygen Cardiovascular status: blood pressure returned to baseline and stable Postop Assessment: no signs of nausea or vomiting Anesthetic complications: no    Last Vitals:  Vitals:   11/14/15 1442 11/14/15 1444  BP: (!) 117/43   Pulse: 64 68  Resp: 20 20  Temp:      Last Pain:  Vitals:   11/15/15 0842  TempSrc:   PainSc: 0-No pain                 [name]

## 2015-11-18 ENCOUNTER — Telehealth: Payer: Self-pay | Admitting: *Deleted

## 2015-11-18 ENCOUNTER — Other Ambulatory Visit: Payer: Self-pay | Admitting: *Deleted

## 2015-11-18 DIAGNOSIS — C801 Malignant (primary) neoplasm, unspecified: Secondary | ICD-10-CM

## 2015-11-18 NOTE — Telephone Encounter (Signed)
Notified patient of upcoming appt with Dr. [name] 11/21/15 at 3:30pm. Verbalized understanding.

## 2015-11-20 ENCOUNTER — Ambulatory Visit
Admission: RE | Admit: 2015-11-20 | Discharge: 2015-11-20 | Disposition: A | Discharge status: Home / Self Care | Payer: Medicare Other | Source: Ambulatory Visit | Attending: Internal Medicine | Admitting: Internal Medicine

## 2015-11-20 DIAGNOSIS — C801 Malignant (primary) neoplasm, unspecified: Secondary | ICD-10-CM | POA: Diagnosis not present

## 2015-11-20 DIAGNOSIS — C3490 Malignant neoplasm of unspecified part of unspecified bronchus or lung: Secondary | ICD-10-CM | POA: Diagnosis not present

## 2015-11-20 DIAGNOSIS — C3411 Malignant neoplasm of upper lobe, right bronchus or lung: Secondary | ICD-10-CM | POA: Insufficient documentation

## 2015-11-20 MED ORDER — GADOBENATE DIMEGLUMINE 529 MG/ML IV SOLN
15.0000 mL | Freq: Once | INTRAVENOUS | Status: AC | PRN
  Administered 2015-11-20: 15 mL via INTRAVENOUS

## 2015-11-21 ENCOUNTER — Encounter: Payer: Self-pay | Admitting: Oncology

## 2015-11-21 ENCOUNTER — Inpatient Hospital Stay: Payer: Medicare Other | Attending: Oncology | Admitting: Oncology

## 2015-11-21 DIAGNOSIS — Z87891 Personal history of nicotine dependence: Secondary | ICD-10-CM | POA: Insufficient documentation

## 2015-11-21 DIAGNOSIS — Z79899 Other long term (current) drug therapy: Secondary | ICD-10-CM

## 2015-11-21 DIAGNOSIS — Z7982 Long term (current) use of aspirin: Secondary | ICD-10-CM | POA: Insufficient documentation

## 2015-11-21 DIAGNOSIS — E119 Type 2 diabetes mellitus without complications: Secondary | ICD-10-CM | POA: Insufficient documentation

## 2015-11-21 DIAGNOSIS — Z801 Family history of malignant neoplasm of trachea, bronchus and lung: Secondary | ICD-10-CM | POA: Diagnosis not present

## 2015-11-21 DIAGNOSIS — Z8701 Personal history of pneumonia (recurrent): Secondary | ICD-10-CM | POA: Insufficient documentation

## 2015-11-21 DIAGNOSIS — E785 Hyperlipidemia, unspecified: Secondary | ICD-10-CM | POA: Diagnosis not present

## 2015-11-21 DIAGNOSIS — J449 Chronic obstructive pulmonary disease, unspecified: Secondary | ICD-10-CM | POA: Insufficient documentation

## 2015-11-21 DIAGNOSIS — I2510 Atherosclerotic heart disease of native coronary artery without angina pectoris: Secondary | ICD-10-CM

## 2015-11-21 DIAGNOSIS — C779 Secondary and unspecified malignant neoplasm of lymph node, unspecified: Secondary | ICD-10-CM | POA: Diagnosis not present

## 2015-11-21 DIAGNOSIS — I10 Essential (primary) hypertension: Secondary | ICD-10-CM | POA: Diagnosis not present

## 2015-11-21 DIAGNOSIS — R0602 Shortness of breath: Secondary | ICD-10-CM | POA: Diagnosis not present

## 2015-11-21 DIAGNOSIS — C3411 Malignant neoplasm of upper lobe, right bronchus or lung: Secondary | ICD-10-CM | POA: Insufficient documentation

## 2015-11-21 DIAGNOSIS — Z8673 Personal history of transient ischemic attack (TIA), and cerebral infarction without residual deficits: Secondary | ICD-10-CM | POA: Diagnosis not present

## 2015-11-21 DIAGNOSIS — Z7984 Long term (current) use of oral hypoglycemic drugs: Secondary | ICD-10-CM | POA: Insufficient documentation

## 2015-11-21 NOTE — Progress Notes (Signed)
New referral from [facility] for lung cancer. States has occasional shortness of breath with exertion but feeling well today. Offers no complaints.

## 2015-11-24 DIAGNOSIS — R0602 Shortness of breath: Secondary | ICD-10-CM | POA: Diagnosis not present

## 2015-11-24 DIAGNOSIS — J449 Chronic obstructive pulmonary disease, unspecified: Secondary | ICD-10-CM | POA: Diagnosis not present

## 2015-11-26 ENCOUNTER — Inpatient Hospital Stay: Payer: Medicare Other

## 2015-11-26 DIAGNOSIS — J449 Chronic obstructive pulmonary disease, unspecified: Secondary | ICD-10-CM | POA: Diagnosis not present

## 2015-11-26 NOTE — Patient Instructions (Signed)
Cisplatin injection What is this medicine? CISPLATIN (SIS pla tin) is a chemotherapy drug. It targets fast dividing cells, like cancer cells, and causes these cells to die. This medicine is used to treat many types of cancer like bladder, ovarian, and testicular cancers. This medicine may be used for other purposes; ask your health care provider or pharmacist if you have questions. What should I tell my health care provider before I take this medicine? They need to know if you have any of these conditions: -blood disorders -hearing problems -kidney disease -recent or ongoing radiation therapy -an unusual or allergic reaction to cisplatin, carboplatin, other chemotherapy, other medicines, foods, dyes, or preservatives -pregnant or trying to get pregnant -breast-feeding How should I use this medicine? This drug is given as an infusion into a vein. It is administered in a hospital or clinic by a specially trained health care professional. Talk to your pediatrician regarding the use of this medicine in children. Special care may be needed. Overdosage: If you think you have taken too much of this medicine contact a poison control center or emergency room at once. NOTE: This medicine is only for you. Do not share this medicine with others. What if I miss a dose? It is important not to miss a dose. Call your doctor or health care professional if you are unable to keep an appointment. What may interact with this medicine? -dofetilide -foscarnet -medicines for seizures -medicines to increase blood counts like filgrastim, pegfilgrastim, sargramostim -probenecid -pyridoxine used with altretamine -rituximab -some antibiotics like amikacin, gentamicin, neomycin, polymyxin B, streptomycin, tobramycin -sulfinpyrazone -vaccines -zalcitabine Talk to your doctor or health care professional before taking any of these medicines: -acetaminophen -aspirin -ibuprofen -ketoprofen -naproxen This list may  not describe all possible interactions. Give your health care provider a list of all the medicines, herbs, non-prescription drugs, or dietary supplements you use. Also tell them if you smoke, drink alcohol, or use illegal drugs. Some items may interact with your medicine. What should I watch for while using this medicine? Your condition will be monitored carefully while you are receiving this medicine. You will need important blood work done while you are taking this medicine. This drug may make you feel generally unwell. This is not uncommon, as chemotherapy can affect healthy cells as well as cancer cells. Report any side effects. Continue your course of treatment even though you feel ill unless your doctor tells you to stop. In some cases, you may be given additional medicines to help with side effects. Follow all directions for their use. Call your doctor or health care professional for advice if you get a fever, chills or sore throat, or other symptoms of a cold or flu. Do not treat yourself. This drug decreases your body's ability to fight infections. Try to avoid being around people who are sick. This medicine may increase your risk to bruise or bleed. Call your doctor or health care professional if you notice any unusual bleeding. Be careful brushing and flossing your teeth or using a toothpick because you may get an infection or bleed more easily. If you have any dental work done, tell your dentist you are receiving this medicine. Avoid taking products that contain aspirin, acetaminophen, ibuprofen, naproxen, or ketoprofen unless instructed by your doctor. These medicines may hide a fever. Do not become pregnant while taking this medicine. Women should inform their doctor if they wish to become pregnant or think they might be pregnant. There is a potential for serious side effects to   an unborn child. Talk to your health care professional or pharmacist for more information. Do not breast-feed an  infant while taking this medicine. Drink fluids as directed while you are taking this medicine. This will help protect your kidneys. Call your doctor or health care professional if you get diarrhea. Do not treat yourself. What side effects may I notice from receiving this medicine? Side effects that you should report to your doctor or health care professional as soon as possible: -allergic reactions like skin rash, itching or hives, swelling of the face, lips, or tongue -signs of infection - fever or chills, cough, sore throat, pain or difficulty passing urine -signs of decreased platelets or bleeding - bruising, pinpoint red spots on the skin, black, tarry stools, nosebleeds -signs of decreased red blood cells - unusually weak or tired, fainting spells, lightheadedness -breathing problems -changes in hearing -gout pain -low blood counts - This drug may decrease the number of white blood cells, red blood cells and platelets. You may be at increased risk for infections and bleeding. -nausea and vomiting -pain, swelling, redness or irritation at the injection site -pain, tingling, numbness in the hands or feet -problems with balance, movement -trouble passing urine or change in the amount of urine Side effects that usually do not require medical attention (report to your doctor or health care professional if they continue or are bothersome): -changes in vision -loss of appetite -metallic taste in the mouth or changes in taste This list may not describe all possible side effects. Call your doctor for medical advice about side effects. You may report side effects to FDA at 1-800-FDA-1088. Where should I keep my medicine? This drug is given in a hospital or clinic and will not be stored at home. NOTE: This sheet is a summary. It may not cover all possible information. If you have questions about this medicine, talk to your doctor, pharmacist, or health care provider.    2016, Elsevier/Gold  Standard. (2007-06-28 14:40:54) Etoposide, VP-16 injection What is this medicine? ETOPOSIDE, VP-16 (e toe POE side) is a chemotherapy drug. It is used to treat testicular cancer, lung cancer, and other cancers. This medicine may be used for other purposes; ask your health care provider or pharmacist if you have questions. What should I tell my health care provider before I take this medicine? They need to know if you have any of these conditions: -infection -kidney disease -low blood counts, like low white cell, platelet, or red cell counts -an unusual or allergic reaction to etoposide, other chemotherapeutic agents, other medicines, foods, dyes, or preservatives -pregnant or trying to get pregnant -breast-feeding How should I use this medicine? This medicine is for infusion into a vein. It is administered in a hospital or clinic by a specially trained health care professional. Talk to your pediatrician regarding the use of this medicine in children. Special care may be needed. Overdosage: If you think you have taken too much of this medicine contact a poison control center or emergency room at once. NOTE: This medicine is only for you. Do not share this medicine with others. What if I miss a dose? It is important not to miss your dose. Call your doctor or health care professional if you are unable to keep an appointment. What may interact with this medicine? -aspirin -certain medications for seizures like carbamazepine, phenobarbital, phenytoin, valproic acid -cyclosporine -levamisole -warfarin This list may not describe all possible interactions. Give your health care provider a list of all the medicines, herbs,  non-prescription drugs, or dietary supplements you use. Also tell them if you smoke, drink alcohol, or use illegal drugs. Some items may interact with your medicine. What should I watch for while using this medicine? Visit your doctor for checks on your progress. This drug may  make you feel generally unwell. This is not uncommon, as chemotherapy can affect healthy cells as well as cancer cells. Report any side effects. Continue your course of treatment even though you feel ill unless your doctor tells you to stop. In some cases, you may be given additional medicines to help with side effects. Follow all directions for their use. Call your doctor or health care professional for advice if you get a fever, chills or sore throat, or other symptoms of a cold or flu. Do not treat yourself. This drug decreases your body's ability to fight infections. Try to avoid being around people who are sick. This medicine may increase your risk to bruise or bleed. Call your doctor or health care professional if you notice any unusual bleeding. Be careful brushing and flossing your teeth or using a toothpick because you may get an infection or bleed more easily. If you have any dental work done, tell your dentist you are receiving this medicine. Avoid taking products that contain aspirin, acetaminophen, ibuprofen, naproxen, or ketoprofen unless instructed by your doctor. These medicines may hide a fever. Do not become pregnant while taking this medicine or for at least 6 months after stopping it. Women should inform their doctor if they wish to become pregnant or think they might be pregnant. Women of child-bearing potential will need to have a negative pregnancy test before starting this medicine. There is a potential for serious side effects to an unborn child. Talk to your health care professional or pharmacist for more information. Do not breast-feed an infant while taking this medicine. Men must use a latex condom during sexual contact with a woman while taking this medicine and for at least 4 months after stopping it. A latex condom is needed even if you have had a vasectomy. Contact your doctor right away if your partner becomes pregnant. Do not donate sperm while taking this medicine and for  at least 4 months after you stop taking this medicine. Men should inform their doctors if they wish to father a child. This medicine may lower sperm counts. What side effects may I notice from receiving this medicine? Side effects that you should report to your doctor or health care professional as soon as possible: -allergic reactions like skin rash, itching or hives, swelling of the face, lips, or tongue -low blood counts - this medicine may decrease the number of white blood cells, red blood cells and platelets. You may be at increased risk for infections and bleeding. -signs of infection - fever or chills, cough, sore throat, pain or difficulty passing urine -signs of decreased platelets or bleeding - bruising, pinpoint red spots on the skin, black, tarry stools, blood in the urine -signs of decreased red blood cells - unusually weak or tired, fainting spells, lightheadedness -breathing problems -changes in vision -mouth or throat sores or ulcers -pain, redness, swelling or irritation at the injection site -pain, tingling, numbness in the hands or feet -redness, blistering, peeling or loosening of the skin, including inside the mouth -seizures -vomiting Side effects that usually do not require medical attention (report to your doctor or health care professional if they continue or are bothersome): -diarrhea -hair loss -loss of appetite -nausea -stomach pain  This list may not describe all possible side effects. Call your doctor for medical advice about side effects. You may report side effects to FDA at 1-800-FDA-1088. Where should I keep my medicine? This drug is given in a hospital or clinic and will not be stored at home. NOTE: This sheet is a summary. It may not cover all possible information. If you have questions about this medicine, talk to your doctor, pharmacist, or health care provider.    2016, Elsevier/Gold Standard. (2013-11-16 12:32:50) Pegfilgrastim injection What is  this medicine? PEGFILGRASTIM (PEG fil gra stim) is a long-acting granulocyte colony-stimulating factor that stimulates the growth of neutrophils, a type of white blood cell important in the body's fight against infection. It is used to reduce the incidence of fever and infection in patients with certain types of cancer who are receiving chemotherapy that affects the bone marrow, and to increase survival after being exposed to high doses of radiation. This medicine may be used for other purposes; ask your health care provider or pharmacist if you have questions. What should I tell my health care provider before I take this medicine? They need to know if you have any of these conditions: -kidney disease -latex allergy -ongoing radiation therapy -sickle cell disease -skin reactions to acrylic adhesives (On-Body Injector only) -an unusual or allergic reaction to pegfilgrastim, filgrastim, other medicines, foods, dyes, or preservatives -pregnant or trying to get pregnant -breast-feeding How should I use this medicine? This medicine is for injection under the skin. If you get this medicine at home, you will be taught how to prepare and give the pre-filled syringe or how to use the On-body Injector. Refer to the patient Instructions for Use for detailed instructions. Use exactly as directed. Take your medicine at regular intervals. Do not take your medicine more often than directed. It is important that you put your used needles and syringes in a special sharps container. Do not put them in a trash can. If you do not have a sharps container, call your pharmacist or healthcare provider to get one. Talk to your pediatrician regarding the use of this medicine in children. While this drug may be prescribed for selected conditions, precautions do apply. Overdosage: If you think you have taken too much of this medicine contact a poison control center or emergency room at once. NOTE: This medicine is only for  you. Do not share this medicine with others. What if I miss a dose? It is important not to miss your dose. Call your doctor or health care professional if you miss your dose. If you miss a dose due to an On-body Injector failure or leakage, a new dose should be administered as soon as possible using a single prefilled syringe for manual use. What may interact with this medicine? Interactions have not been studied. Give your health care provider a list of all the medicines, herbs, non-prescription drugs, or dietary supplements you use. Also tell them if you smoke, drink alcohol, or use illegal drugs. Some items may interact with your medicine. This list may not describe all possible interactions. Give your health care provider a list of all the medicines, herbs, non-prescription drugs, or dietary supplements you use. Also tell them if you smoke, drink alcohol, or use illegal drugs. Some items may interact with your medicine. What should I watch for while using this medicine? You may need blood work done while you are taking this medicine. If you are going to need a MRI, CT scan, or other  procedure, tell your doctor that you are using this medicine (On-Body Injector only). What side effects may I notice from receiving this medicine? Side effects that you should report to your doctor or health care professional as soon as possible: -allergic reactions like skin rash, itching or hives, swelling of the face, lips, or tongue -dizziness -fever -pain, redness, or irritation at site where injected -pinpoint red spots on the skin -red or dark-brown urine -shortness of breath or breathing problems -stomach or side pain, or pain at the shoulder -swelling -tiredness -trouble passing urine or change in the amount of urine Side effects that usually do not require medical attention (report to your doctor or health care professional if they continue or are bothersome): -bone pain -muscle pain This list may  not describe all possible side effects. Call your doctor for medical advice about side effects. You may report side effects to FDA at 1-800-FDA-1088. Where should I keep my medicine? Keep out of the reach of children. Store pre-filled syringes in a refrigerator between 2 and 8 degrees C (36 and 46 degrees F). Do not freeze. Keep in carton to protect from light. Throw away this medicine if it is left out of the refrigerator for more than 48 hours. Throw away any unused medicine after the expiration date. NOTE: This sheet is a summary. It may not cover all possible information. If you have questions about this medicine, talk to your doctor, pharmacist, or health care provider.    2016, Elsevier/Gold Standard. (2014-04-12 14:30:14)

## 2015-11-26 NOTE — Progress Notes (Signed)
[place]  [phone]) [phone] [phone]) [phone]  ID: [name] [name] OB: December 12, 1940  MR#: [id number]  DJS#:[id number]  Patient Care Team: Jerrol Banana., MD as PCP - General (Unknown Physician Specialty) [name], MD as Consulting Physician (Cardiology)  CHIEF COMPLAINT: Clinical stage IIIa small cell lung carcinoma of the right upper lobe lung.  INTERVAL HISTORY: Patient is a 75 year old female who underwent CT scanning the lung screening and was found to have a 9 mm right lower lobe lesion suspicious for malignancy. Subsequent PET scan and biopsy revealed a paratracheal node that was positive for small cell lung carcinoma. Patient has chronic cough and shortness of breath, but otherwise feels well. She has no neurologic complaints. She denies any recent fevers or illnesses. She has a good appetite and denies weight loss. She has no chest pain. She denies any nausea, vomiting, constipation, or diarrhea. She has no urinary complaints. Patient otherwise feels well and offers no further specific complaints.  REVIEW OF SYSTEMS:   Review of Systems  Constitutional: Negative.  Negative for fever, malaise/fatigue and weight loss.  Respiratory: Positive for cough and shortness of breath.   Cardiovascular: Negative.  Negative for chest pain.  Gastrointestinal: Negative.  Negative for abdominal pain.  Musculoskeletal: Negative.   Neurological: Negative.   Psychiatric/Behavioral: Negative.  The patient is not nervous/anxious.     As per HPI. Otherwise, a complete review of systems is negatve.  PAST MEDICAL HISTORY: Past Medical History:  Diagnosis Date  . Abnormal CT lung screening 10/17/2015  . COPD (chronic obstructive pulmonary disease) ([place])   . Coronary artery disease, non-occlusive    a. cath 2006: min nonobs CAD; b. cath 12/2010: cath LAD 50%, RCA 60%; c. 08/2013: Minimal luminal irregs, right dominant system with no significant CAD, diffuse luminal  irregs noted. Normal EF 55%, no AS or MS.   . Diabetes mellitus   . Hyperlipemia    Followed by Dr. Rosanna Randy  . Hypertension   . Lung cancer ([place])   . Macular degeneration    rt  . Personal history of tobacco use, presenting hazards to health 10/15/2015  . Pneumonia    hx  . Shortness of breath   . Stroke [facility])     PAST SURGICAL HISTORY: Past Surgical History:  Procedure Laterality Date  . ABDOMINAL HYSTERECTOMY    . ANTERIOR CERVICAL DECOMP/DISCECTOMY FUSION N/A 10/19/2013   Procedure: CERVICAL FIVE-SIX ANTERIOR CERVICAL DECOMPRESSION WITH FUSION INTERBODY PROSTHESIS PLATING AND PEEK CAGE;  Surgeon: Ophelia Charter, MD;  Location: [place] [facility];  Service: Neurosurgery;  Laterality: N/A;  . BACK SURGERY  80's  . BREAST CYST EXCISION Left    left negative   . CARDIAC CATHETERIZATION  05/2004  . CATARACT EXTRACTION Left   . CHOLECYSTECTOMY    . ENDARTERECTOMY Left 10/24/2014   Procedure: ENDARTERECTOMY CAROTID;  Surgeon: Algernon Huxley, MD;  Location: [facility] ORS;  Service: Vascular;  Laterality: Left;  . ENDOBRONCHIAL ULTRASOUND N/A 11/14/2015   Procedure: ENDOBRONCHIAL ULTRASOUND;  Surgeon: [name], MD;  Location: [facility] ORS;  Service: Cardiopulmonary;  Laterality: N/A;  . TONSILLECTOMY AND ADENOIDECTOMY    . VESICOVAGINAL FISTULA CLOSURE W/ TAH      FAMILY HISTORY: Family History  Problem Relation Age of Onset  . Stroke Mother     Massive  . Heart attack Father     Massive  . Heart attack Brother   . Heart attack Brother   . Lung cancer Maternal Grandfather   .  Heart attack Paternal Grandmother     MI       ADVANCED DIRECTIVES (Y/N):  N   HEALTH MAINTENANCE: Social History  Substance Use Topics  . Smoking status: Former Smoker    Packs/day: 1.00    Years: 50.00    Types: Cigarettes    Quit date: 10/03/2015  . Smokeless tobacco: Never Used     Comment: smokes 3 cigs daily 05/06/15. Pt instructed to quit.  . Alcohol use No     Colonoscopy:  PAP:  Bone  density:  Lipid panel:  Allergies  Allergen Reactions  . Coconut Fatty Acids Swelling    Throat swells    Current Outpatient Prescriptions  Medication Sig Dispense Refill  . albuterol (PROVENTIL) (5 MG/ML) 0.5% nebulizer solution Take 2.5 mg by nebulization 2 (two) times daily.    Marland Kitchen aspirin 81 MG tablet Take 81 mg by mouth every morning.     Marland Kitchen atorvastatin (LIPITOR) 20 MG tablet Take 1 tablet by mouth at  bedtime 90 tablet 3  . budesonide-formoterol (SYMBICORT) 160-4.5 MCG/ACT inhaler Inhale 2 puffs into the lungs 2 (two) times daily. 1 Inhaler 0  . diazepam (VALIUM) 5 MG tablet Take 1 tablet (5 mg total) by mouth every 6 (six) hours as needed for muscle spasms. 50 tablet 1  . gabapentin (NEURONTIN) 600 MG tablet Take 1 tablet by mouth two  times daily 180 tablet 3  . glimepiride (AMARYL) 4 MG tablet Take 1 tablet by mouth two  times daily 180 tablet 3  . isosorbide mononitrate (IMDUR) 30 MG 24 hr tablet Take 1 tablet by mouth  daily 90 tablet 3  . losartan-hydrochlorothiazide (HYZAAR) 100-25 MG tablet Take 1 tablet by mouth  daily 90 tablet 3  . magnesium oxide (MAG-OX) 400 (241.3 MG) MG tablet Take 1 tablet by mouth 2 (two) times daily.    . metFORMIN (GLUCOPHAGE) 1000 MG tablet Take 1 tablet by mouth  every day 90 tablet 3  . Multiple Vitamins-Minerals (ICAPS) CAPS Take 2 capsules by mouth daily. Reported on 04/17/2015    . naproxen sodium (ANAPROX) 220 MG tablet Take 220 mg by mouth as needed. Takes twice a week as needed.    . nitroGLYCERIN (NITROSTAT) 0.4 MG SL tablet Place 1 tablet (0.4 mg total) under the tongue every 5 (five) minutes as needed for chest pain. 25 tablet 3  . oxymetazoline (NO DRIP NASAL SPRAY) 0.05 % nasal spray Place 1 spray into both nostrils 2 (two) times daily.    . pantoprazole (PROTONIX) 40 MG tablet Take 1 tablet by mouth  daily 90 tablet 3  . pioglitazone (ACTOS) 30 MG tablet Take 1 tablet (30 mg total) by mouth daily. 90 tablet 3  . potassium chloride  (K-DUR) 10 MEQ tablet Take 3 tablets by mouth  daily as directed 270 tablet 1   No current facility-administered medications for this visit.     OBJECTIVE: Vitals:   11/21/15 1530  BP: (!) 148/60  Pulse: 71  Resp: 18  Temp: (!) 96.9 F (36.1 C)     Body mass index is 28.59 kg/m.    ECOG FS:0 - Asymptomatic  General: Well-developed, well-nourished, no acute distress. Eyes: Pink conjunctiva, anicteric sclera. HEENT: Normocephalic, moist mucous membranes, clear oropharnyx. Lungs: Clear to auscultation bilaterally. Heart: Regular rate and rhythm. No rubs, murmurs, or gallops. Abdomen: Soft, nontender, nondistended. No organomegaly noted, normoactive bowel sounds. Musculoskeletal: No edema, cyanosis, or clubbing. Neuro: Alert, answering all questions appropriately. Cranial nerves grossly intact.  Skin: No rashes or petechiae noted. Psych: Normal affect. Lymphatics: No cervical, calvicular, axillary or inguinal LAD.   LAB RESULTS:  Lab Results  Component Value Date   NA 141 11/05/2015   K 4.8 11/05/2015   CL 100 (L) 11/05/2015   CO2 34 (H) 11/05/2015   GLUCOSE 118 (H) 11/05/2015   BUN 25 (H) 11/05/2015   CREATININE 1.01 (H) 11/05/2015   CALCIUM 9.2 11/05/2015   PROT 6.8 08/19/2015   ALBUMIN 4.2 08/19/2015   AST 15 08/19/2015   ALT 11 08/19/2015   ALKPHOS 95 08/19/2015   BILITOT 0.2 08/19/2015   GFRNONAA 54 (L) 11/05/2015   GFRAA >60 11/05/2015    Lab Results  Component Value Date   WBC 7.3 11/05/2015   NEUTROABS 5.5 08/19/2015   HGB 13.4 11/05/2015   HCT 38.5 11/05/2015   MCV 92.4 11/05/2015   PLT 231 11/05/2015     STUDIES: Mr Jeri Cos FT Contrast  Result Date: 11/20/2015 CLINICAL DATA:  Lung cancer. Staging.No reported neurologic symptoms. EXAM: MRI HEAD WITHOUT AND WITH CONTRAST TECHNIQUE: Multiplanar, multiecho pulse sequences of the brain and surrounding structures were obtained without and with intravenous contrast. CONTRAST:  74m MULTIHANCE GADOBENATE  DIMEGLUMINE 529 MG/ML IV SOLN COMPARISON:  MR brain 08/30/2014. FINDINGS: No evidence for acute infarction, hemorrhage, mass lesion, hydrocephalus, or extra-axial fluid. Mild atrophy, not unexpected for age. Minor white matter disease, not unexpected for age. Flow voids are maintained throughout the carotid, basilar, and vertebral arteries. There are no areas of chronic hemorrhage. Post infusion, no abnormal enhancement of the brain or meninges. Visualized calvarium, skull base, and upper cervical osseous structures unremarkable. Scalp and extracranial soft tissues, orbits, and mastoids show no acute process. Layering fluid both divisions sphenoid, suggesting acuity. LEFT cataract extraction. IMPRESSION: No acute intracranial findings are evident. No abnormal enhancement to suggest metastatic disease. Suspected acute sphenoid sinusitis. Electronically Signed   By: JStaci RighterM.D.   On: 11/20/2015 15:17    ASSESSMENT: Clinical stage IIIa small cell lung carcinoma of the right upper lobe lung.  PLAN:    1. Clinical stage IIIa small cell lung carcinoma of the right upper lobe lung: CT and PET scan results reviewed independently concerning for stage IIIa disease given patient's 11 mm right paratracheal lymph node positive for small cell lung cancer. Patient has no other evidence of metastatic disease. MRI the brain is negative. Patient will benefit from concurrent chemotherapy and XRT and a referral has been made to radiation oncology for evaluation. Patient will also require port placement. Patient will return to clinic on December 10, 2015 to initiate cycle 1 of 4 of cisplatin on day 1 and etoposide on days 1 and 2 and 3. Will add in Neulasta support if necessary at the conclusion of her XRT. Plan to reimage with PET scan after the conclusion of cycle 4.  Approximately 45 minutes was spent in discussion of which greater than 50% was consultation.   Patient expressed understanding and was in agreement  with this plan. She also understands that She can call clinic at any time with any questions, concerns, or complaints.   Primary cancer of right upper lobe of lung ([facility]   Staging form: Lung, AJCC 7th Edition   - Clinical stage from 11/26/2015: Stage IIIA (T1a, N2, M0) - Signed by TLloyd Huger MD on 11/26/2015  TLloyd Huger MD   11/26/2015 8:41 AM

## 2015-11-27 ENCOUNTER — Ambulatory Visit
Admission: RE | Admit: 2015-11-27 | Discharge: 2015-11-27 | Disposition: A | Discharge status: Home / Self Care | Payer: Medicare Other | Source: Ambulatory Visit | Attending: Radiation Oncology | Admitting: Radiation Oncology

## 2015-11-27 ENCOUNTER — Encounter: Payer: Self-pay | Admitting: Radiation Oncology

## 2015-11-27 ENCOUNTER — Telehealth: Payer: Self-pay | Admitting: *Deleted

## 2015-11-27 VITALS — BP 118/55 | HR 79 | Temp 97.8°F | Wt 167.5 lb

## 2015-11-27 DIAGNOSIS — E785 Hyperlipidemia, unspecified: Secondary | ICD-10-CM | POA: Insufficient documentation

## 2015-11-27 DIAGNOSIS — C3411 Malignant neoplasm of upper lobe, right bronchus or lung: Secondary | ICD-10-CM | POA: Diagnosis not present

## 2015-11-27 DIAGNOSIS — Z510 Encounter for antineoplastic radiation therapy: Secondary | ICD-10-CM | POA: Insufficient documentation

## 2015-11-27 DIAGNOSIS — E119 Type 2 diabetes mellitus without complications: Secondary | ICD-10-CM | POA: Insufficient documentation

## 2015-11-27 DIAGNOSIS — Z87891 Personal history of nicotine dependence: Secondary | ICD-10-CM | POA: Insufficient documentation

## 2015-11-27 DIAGNOSIS — Z801 Family history of malignant neoplasm of trachea, bronchus and lung: Secondary | ICD-10-CM | POA: Insufficient documentation

## 2015-11-27 DIAGNOSIS — I10 Essential (primary) hypertension: Secondary | ICD-10-CM | POA: Insufficient documentation

## 2015-11-27 DIAGNOSIS — R0602 Shortness of breath: Secondary | ICD-10-CM | POA: Insufficient documentation

## 2015-11-27 DIAGNOSIS — Z79899 Other long term (current) drug therapy: Secondary | ICD-10-CM | POA: Insufficient documentation

## 2015-11-27 DIAGNOSIS — Z8673 Personal history of transient ischemic attack (TIA), and cerebral infarction without residual deficits: Secondary | ICD-10-CM | POA: Insufficient documentation

## 2015-11-27 DIAGNOSIS — Z8701 Personal history of pneumonia (recurrent): Secondary | ICD-10-CM | POA: Insufficient documentation

## 2015-11-27 DIAGNOSIS — J449 Chronic obstructive pulmonary disease, unspecified: Secondary | ICD-10-CM | POA: Insufficient documentation

## 2015-11-27 DIAGNOSIS — I2510 Atherosclerotic heart disease of native coronary artery without angina pectoris: Secondary | ICD-10-CM | POA: Insufficient documentation

## 2015-11-27 DIAGNOSIS — Z7984 Long term (current) use of oral hypoglycemic drugs: Secondary | ICD-10-CM | POA: Insufficient documentation

## 2015-11-27 DIAGNOSIS — Z7982 Long term (current) use of aspirin: Secondary | ICD-10-CM | POA: Insufficient documentation

## 2015-11-27 NOTE — Telephone Encounter (Signed)
Pt called to see if our office arranged for her to get a PAC placed.

## 2015-11-27 NOTE — Telephone Encounter (Signed)
PAC referral sent to [facility]. Their office will notify pt with appt details.

## 2015-11-27 NOTE — Consult Note (Signed)
Except an outstanding is perfect of Radiation Oncology NEW PATIENT EVALUATION  Name: [name] [name]  MRN: [id number]  Date:   11/27/2015     DOB: [birth year]   This 75 y.o. female patient presents to the clinic for initial evaluation of stage IIIa (T1 1 N to M0) small cell lung cancer.  REFERRING PHYSICIAN: Jerrol Banana.,*  CHIEF COMPLAINT:  Chief Complaint  Patient presents with  . Lung Cancer    DIAGNOSIS: The encounter diagnosis was Primary cancer of right upper lobe of lung ([place]).   PREVIOUS INVESTIGATIONS:  CT scans and PET/CT scans reviewed Pathology report reviewed Clinical notes reviewed  HPI: Patient is a 75 year old female presented with some dysphasia and cough was seen by PMD and had an abnormal chest x-ray prompting a CT scan of her chest. She was noted to have a  9 mm subpleural nodular opacities appears segment of the right lower lobe. This was followed by a PET CT scan demonstrating a hypermetabolic right upper lobe nodule worrisome for primary bronchogenic carcinoma as well as a hypermetabolic midline mediastinal lymph node. Patient went [place] transbronchial fine needle aspiration which was positive for small cell undifferentiated carcinoma. MRI scan of her brain was negative for metastatic disease. Patient is been seen by medical oncology and is now being planned for concurrent chemoradiation. She is doing fairly well. Her weight has been stable she specifically denies cough hemoptysis or chest tightness she does use occasional nasal oxygen does have dyspnea on exertion.  PLANNED TREATMENT REGIMEN: Concurrent chemoradiation  PAST MEDICAL HISTORY:  has a past medical history of Abnormal CT lung screening (10/17/2015); COPD (chronic obstructive pulmonary disease) ([place]); Coronary artery disease, non-occlusive; Diabetes mellitus; Hyperlipemia; Hypertension; Lung cancer ([place]); Macular degeneration; Personal history of tobacco use, presenting hazards to health  (10/15/2015); Pneumonia; Shortness of breath; and Stroke ([place]).    PAST SURGICAL HISTORY:  Past Surgical History:  Procedure Laterality Date  . ABDOMINAL HYSTERECTOMY    . ANTERIOR CERVICAL DECOMP/DISCECTOMY FUSION N/A 10/19/2013   Procedure: CERVICAL FIVE-SIX ANTERIOR CERVICAL DECOMPRESSION WITH FUSION INTERBODY PROSTHESIS PLATING AND PEEK CAGE;  Surgeon: Ophelia Charter, MD;  Location: [place] [facility];  Service: Neurosurgery;  Laterality: N/A;  . BACK SURGERY  80's  . BREAST CYST EXCISION Left    left negative   . CARDIAC CATHETERIZATION  05/2004  . CATARACT EXTRACTION Left   . CHOLECYSTECTOMY    . ENDARTERECTOMY Left 10/24/2014   Procedure: ENDARTERECTOMY CAROTID;  Surgeon: Algernon Huxley, MD;  Location: [facility] ORS;  Service: Vascular;  Laterality: Left;  . ENDOBRONCHIAL ULTRASOUND N/A 11/14/2015   Procedure: ENDOBRONCHIAL ULTRASOUND;  Surgeon: [name], MD;  Location: [facility] ORS;  Service: Cardiopulmonary;  Laterality: N/A;  . TONSILLECTOMY AND ADENOIDECTOMY    . VESICOVAGINAL FISTULA CLOSURE W/ TAH      FAMILY HISTORY: family history includes Heart attack in her brother, brother, father, and paternal grandmother; Lung cancer in her maternal grandfather; Stroke in her mother.  SOCIAL HISTORY:  reports that she quit smoking about 7 weeks ago. Her smoking use included Cigarettes. She has a 50.00 pack-year smoking history. She has never used smokeless tobacco. She reports that she does not drink alcohol or use drugs.  ALLERGIES: Coconut fatty acids  MEDICATIONS:  Current Outpatient Prescriptions  Medication Sig Dispense Refill  . albuterol (PROVENTIL) (5 MG/ML) 0.5% nebulizer solution Take 2.5 mg by nebulization 2 (two) times daily.    Marland Kitchen aspirin 81 MG tablet Take 81 mg by mouth every morning.     Marland Kitchen  atorvastatin (LIPITOR) 20 MG tablet Take 1 tablet by mouth at  bedtime 90 tablet 3  . budesonide-formoterol (SYMBICORT) 160-4.5 MCG/ACT inhaler Inhale 2 puffs into the lungs 2 (two) times daily.  1 Inhaler 0  . diazepam (VALIUM) 5 MG tablet Take 1 tablet (5 mg total) by mouth every 6 (six) hours as needed for muscle spasms. 50 tablet 1  . gabapentin (NEURONTIN) 600 MG tablet Take 1 tablet by mouth two  times daily 180 tablet 3  . glimepiride (AMARYL) 4 MG tablet Take 1 tablet by mouth two  times daily 180 tablet 3  . isosorbide mononitrate (IMDUR) 30 MG 24 hr tablet Take 1 tablet by mouth  daily 90 tablet 3  . losartan-hydrochlorothiazide (HYZAAR) 100-25 MG tablet Take 1 tablet by mouth  daily 90 tablet 3  . magnesium oxide (MAG-OX) 400 (241.3 MG) MG tablet Take 1 tablet by mouth 2 (two) times daily.    . metFORMIN (GLUCOPHAGE) 1000 MG tablet Take 1 tablet by mouth  every day 90 tablet 3  . Multiple Vitamins-Minerals (ICAPS) CAPS Take 2 capsules by mouth daily. Reported on 04/17/2015    . naproxen sodium (ANAPROX) 220 MG tablet Take 220 mg by mouth as needed. Takes twice a week as needed.    . nitroGLYCERIN (NITROSTAT) 0.4 MG SL tablet Place 1 tablet (0.4 mg total) under the tongue every 5 (five) minutes as needed for chest pain. 25 tablet 3  . oxymetazoline (NO DRIP NASAL SPRAY) 0.05 % nasal spray Place 1 spray into both nostrils 2 (two) times daily.    . pantoprazole (PROTONIX) 40 MG tablet Take 1 tablet by mouth  daily 90 tablet 3  . pioglitazone (ACTOS) 30 MG tablet Take 1 tablet (30 mg total) by mouth daily. 90 tablet 3  . potassium chloride (K-DUR) 10 MEQ tablet Take 3 tablets by mouth  daily as directed 270 tablet 1   No current facility-administered medications for this encounter.     ECOG PERFORMANCE STATUS:  1 - Symptomatic but completely ambulatory  REVIEW OF SYSTEMS: Except for the nasal oxygen and some minor dysphasia  Patient denies any weight loss, fatigue, weakness, fever, chills or night sweats. Patient denies any loss of vision, blurred vision. Patient denies any ringing  of the ears or hearing loss. No irregular heartbeat. Patient denies heart murmur or history of  fainting. Patient denies any chest pain or pain radiating to her upper extremities. Patient denies any shortness of breath, difficulty breathing at night, cough or hemoptysis. Patient denies any swelling in the lower legs. Patient denies any nausea vomiting, vomiting of blood, or coffee ground material in the vomitus. Patient denies any stomach pain. Patient states has had normal bowel movements no significant constipation or diarrhea. Patient denies any dysuria, hematuria or significant nocturia. Patient denies any problems walking, swelling in the joints or loss of balance. Patient denies any skin changes, loss of hair or loss of weight. Patient denies any excessive worrying or anxiety or significant depression. Patient denies any problems with insomnia. Patient denies excessive thirst, polyuria, polydipsia. Patient denies any swollen glands, patient denies easy bruising or easy bleeding. Patient denies any recent infections, allergies or URI. Patient "s visual fields have not changed significantly in recent time.   PHYSICAL EXAM: BP (!) 118/55   Pulse 79   Temp 97.8 F (36.6 C)   Wt 167 lb 8.8 oz (76 kg)   BMI 28.76 kg/m  A well-developed female in NAD.  Well-developed well-nourished patient in  NAD. HEENT reveals PERLA, EOMI, discs not visualized.  Oral cavity is clear. No oral mucosal lesions are identified. Neck is clear without evidence of cervical or supraclavicular adenopathy. Lungs are clear to A&P. Cardiac examination is essentially unremarkable with regular rate and rhythm without murmur rub or thrill. Abdomen is benign with no organomegaly or masses noted. Motor sensory and DTR levels are equal and symmetric in the upper and lower extremities. Cranial nerves II through XII are grossly intact. Proprioception is intact. No peripheral adenopathy or edema is identified. No motor or sensory levels are noted. Crude visual fields are within normal range.  LABORATORY DATA: Cytology reports  reviewed    RADIOLOGY RESULTS: CT scans PET/CT scans and MRI of brain all reviewed and compatible above-stated findings   IMPRESSION: Limited stage IIIa small cell lung cancer in 75 year old female   PLAN: At this time like to go ahead with concurrent chemoradiation would plan on delivering 6000 cGy to her chest along with concurrent chemotherapy. I would choose to use I MRT based on the close proximity to her vital structures such as heart esophagus and spinal cord for the note I need to target. Would also treat her peripheral most likely primary lung nodule again to 6000 cGy. Risks and benefits of treatment were reviewed with the patient and her husband and both seem to comprehend my treatment plan well. I have personally ordered and scheduled CT simulation. We'll coordinate chemotherapy with medical oncology.There will be extra effort by both professional staff as well as technical staff to coordinate and manage concurrent chemoradiation and ensuing side effects during his treatments.   I would like to take this opportunity to thank you for allowing me to participate in the care of your patient.Armstead Peaks., MD

## 2015-12-02 ENCOUNTER — Ambulatory Visit
Admission: RE | Admit: 2015-12-02 | Discharge: 2015-12-02 | Disposition: A | Discharge status: Home / Self Care | Payer: Medicare Other | Source: Ambulatory Visit | Attending: Radiation Oncology | Admitting: Radiation Oncology

## 2015-12-02 DIAGNOSIS — I10 Essential (primary) hypertension: Secondary | ICD-10-CM | POA: Diagnosis not present

## 2015-12-02 DIAGNOSIS — E785 Hyperlipidemia, unspecified: Secondary | ICD-10-CM | POA: Diagnosis not present

## 2015-12-02 DIAGNOSIS — Z7984 Long term (current) use of oral hypoglycemic drugs: Secondary | ICD-10-CM | POA: Diagnosis not present

## 2015-12-02 DIAGNOSIS — R0602 Shortness of breath: Secondary | ICD-10-CM | POA: Diagnosis not present

## 2015-12-02 DIAGNOSIS — Z7982 Long term (current) use of aspirin: Secondary | ICD-10-CM | POA: Diagnosis not present

## 2015-12-02 DIAGNOSIS — Z79899 Other long term (current) drug therapy: Secondary | ICD-10-CM | POA: Diagnosis not present

## 2015-12-02 DIAGNOSIS — Z87891 Personal history of nicotine dependence: Secondary | ICD-10-CM | POA: Diagnosis not present

## 2015-12-02 DIAGNOSIS — J449 Chronic obstructive pulmonary disease, unspecified: Secondary | ICD-10-CM | POA: Diagnosis not present

## 2015-12-02 DIAGNOSIS — Z801 Family history of malignant neoplasm of trachea, bronchus and lung: Secondary | ICD-10-CM | POA: Diagnosis not present

## 2015-12-02 DIAGNOSIS — Z8673 Personal history of transient ischemic attack (TIA), and cerebral infarction without residual deficits: Secondary | ICD-10-CM | POA: Diagnosis not present

## 2015-12-02 DIAGNOSIS — C3411 Malignant neoplasm of upper lobe, right bronchus or lung: Secondary | ICD-10-CM | POA: Diagnosis not present

## 2015-12-02 DIAGNOSIS — I2510 Atherosclerotic heart disease of native coronary artery without angina pectoris: Secondary | ICD-10-CM | POA: Diagnosis not present

## 2015-12-02 DIAGNOSIS — Z8701 Personal history of pneumonia (recurrent): Secondary | ICD-10-CM | POA: Diagnosis not present

## 2015-12-02 DIAGNOSIS — E119 Type 2 diabetes mellitus without complications: Secondary | ICD-10-CM | POA: Diagnosis not present

## 2015-12-02 DIAGNOSIS — Z510 Encounter for antineoplastic radiation therapy: Secondary | ICD-10-CM | POA: Diagnosis not present

## 2015-12-03 ENCOUNTER — Other Ambulatory Visit: Payer: Self-pay | Admitting: Vascular Surgery

## 2015-12-03 ENCOUNTER — Inpatient Hospital Stay: Payer: Medicare Other

## 2015-12-04 ENCOUNTER — Encounter
Admission: RE | Disposition: A | Discharge status: Home / Self Care | Payer: Self-pay | Source: Ambulatory Visit | Attending: Vascular Surgery

## 2015-12-04 ENCOUNTER — Other Ambulatory Visit: Payer: Self-pay | Admitting: Oncology

## 2015-12-04 ENCOUNTER — Ambulatory Visit
Admission: RE | Admit: 2015-12-04 | Discharge: 2015-12-04 | Disposition: A | Discharge status: Home / Self Care | Payer: Medicare Other | Source: Ambulatory Visit | Attending: Vascular Surgery | Admitting: Vascular Surgery

## 2015-12-04 ENCOUNTER — Telehealth: Payer: Self-pay | Admitting: *Deleted

## 2015-12-04 DIAGNOSIS — I999 Unspecified disorder of circulatory system: Secondary | ICD-10-CM | POA: Diagnosis not present

## 2015-12-04 DIAGNOSIS — F17200 Nicotine dependence, unspecified, uncomplicated: Secondary | ICD-10-CM | POA: Diagnosis not present

## 2015-12-04 DIAGNOSIS — I6529 Occlusion and stenosis of unspecified carotid artery: Secondary | ICD-10-CM | POA: Diagnosis not present

## 2015-12-04 DIAGNOSIS — Z8249 Family history of ischemic heart disease and other diseases of the circulatory system: Secondary | ICD-10-CM | POA: Insufficient documentation

## 2015-12-04 DIAGNOSIS — C801 Malignant (primary) neoplasm, unspecified: Secondary | ICD-10-CM | POA: Diagnosis not present

## 2015-12-04 DIAGNOSIS — Z91018 Allergy to other foods: Secondary | ICD-10-CM | POA: Insufficient documentation

## 2015-12-04 DIAGNOSIS — Z7984 Long term (current) use of oral hypoglycemic drugs: Secondary | ICD-10-CM | POA: Diagnosis not present

## 2015-12-04 DIAGNOSIS — J449 Chronic obstructive pulmonary disease, unspecified: Secondary | ICD-10-CM | POA: Insufficient documentation

## 2015-12-04 DIAGNOSIS — Z9049 Acquired absence of other specified parts of digestive tract: Secondary | ICD-10-CM | POA: Diagnosis not present

## 2015-12-04 DIAGNOSIS — I10 Essential (primary) hypertension: Secondary | ICD-10-CM | POA: Insufficient documentation

## 2015-12-04 DIAGNOSIS — I639 Cerebral infarction, unspecified: Secondary | ICD-10-CM | POA: Diagnosis not present

## 2015-12-04 DIAGNOSIS — C3411 Malignant neoplasm of upper lobe, right bronchus or lung: Secondary | ICD-10-CM

## 2015-12-04 DIAGNOSIS — Z823 Family history of stroke: Secondary | ICD-10-CM | POA: Insufficient documentation

## 2015-12-04 DIAGNOSIS — Z90710 Acquired absence of both cervix and uterus: Secondary | ICD-10-CM | POA: Diagnosis not present

## 2015-12-04 DIAGNOSIS — Z8342 Family history of familial hypercholesterolemia: Secondary | ICD-10-CM | POA: Insufficient documentation

## 2015-12-04 DIAGNOSIS — E119 Type 2 diabetes mellitus without complications: Secondary | ICD-10-CM | POA: Diagnosis not present

## 2015-12-04 DIAGNOSIS — E785 Hyperlipidemia, unspecified: Secondary | ICD-10-CM | POA: Diagnosis not present

## 2015-12-04 HISTORY — PX: PERIPHERAL VASCULAR CATHETERIZATION: SHX172C

## 2015-12-04 SURGERY — PORTA CATH INSERTION
Anesthesia: Moderate Sedation

## 2015-12-04 MED ORDER — LIDOCAINE-PRILOCAINE 2.5-2.5 % EX CREA: TOPICAL_CREAM | CUTANEOUS | 3 refills | Status: DC

## 2015-12-04 MED ORDER — PROCHLORPERAZINE MALEATE 10 MG PO TABS: 10.0000 mg | ORAL_TABLET | Freq: Four times a day (QID) | ORAL | 1 refills | Status: DC | PRN

## 2015-12-04 MED ORDER — FENTANYL CITRATE (PF) 100 MCG/2ML IJ SOLN
INTRAMUSCULAR | Status: AC
  Filled 2015-12-04: qty 2

## 2015-12-04 MED ORDER — SODIUM CHLORIDE 0.9 % IR SOLN
Freq: Once | Status: DC
  Filled 2015-12-04: qty 2

## 2015-12-04 MED ORDER — FENTANYL CITRATE (PF) 100 MCG/2ML IJ SOLN
INTRAMUSCULAR | Status: DC | PRN
  Administered 2015-12-04 (×2): 25 ug via INTRAVENOUS
  Administered 2015-12-04: 50 ug via INTRAVENOUS

## 2015-12-04 MED ORDER — ONDANSETRON HCL 8 MG PO TABS: 8.0000 mg | ORAL_TABLET | Freq: Two times a day (BID) | ORAL | 1 refills | Status: DC | PRN

## 2015-12-04 MED ORDER — MIDAZOLAM HCL 2 MG/2ML IJ SOLN
INTRAMUSCULAR | Status: DC | PRN
  Administered 2015-12-04 (×2): 1 mg via INTRAVENOUS
  Administered 2015-12-04: 2 mg via INTRAVENOUS

## 2015-12-04 MED ORDER — HYDROMORPHONE HCL 1 MG/ML IJ SOLN: 1.0000 mg | Freq: Once | INTRAMUSCULAR | Status: DC

## 2015-12-04 MED ORDER — SODIUM CHLORIDE 0.9 % IV SOLN: INTRAVENOUS | Status: DC

## 2015-12-04 MED ORDER — MIDAZOLAM HCL 5 MG/5ML IJ SOLN
INTRAMUSCULAR | Status: AC
  Filled 2015-12-04: qty 5

## 2015-12-04 MED ORDER — DEXTROSE 5 % IV SOLN
1.5000 g | INTRAVENOUS | Status: AC
  Administered 2015-12-04: 1.5 g via INTRAVENOUS

## 2015-12-04 MED ORDER — ONDANSETRON HCL 4 MG/2ML IJ SOLN: 4.0000 mg | Freq: Four times a day (QID) | INTRAMUSCULAR | Status: DC | PRN

## 2015-12-04 SURGICAL SUPPLY — 10 items
BAG DECANTER STRL (MISCELLANEOUS) ×2 IMPLANT
KIT PORT POWER 8FR ISP CVUE (Catheter) ×2 IMPLANT
PACK ANGIOGRAPHY (CUSTOM PROCEDURE TRAY) ×2 IMPLANT
PAD GROUND ADULT SPLIT (MISCELLANEOUS) ×2 IMPLANT
PENCIL ELECTRO HAND CTR (MISCELLANEOUS) ×2 IMPLANT
PREP CHG 10.5 TEAL (MISCELLANEOUS) ×2 IMPLANT
SUT MNCRL AB 4-0 PS2 18 (SUTURE) ×2 IMPLANT
SUT PROLENE 0 CT 1 30 (SUTURE) ×2 IMPLANT
SUTURE VIC 3-0 (SUTURE) ×2 IMPLANT
TOWEL OR 17X26 4PK STRL BLUE (TOWEL DISPOSABLE) ×2 IMPLANT

## 2015-12-04 NOTE — Telephone Encounter (Addendum)
Asking when she is to get her cream for port inserted today and the other medicine she is to get

## 2015-12-04 NOTE — Op Note (Signed)
       VEIN AND VASCULAR SURGERY       Operative Note  Date: 12/04/2015  Preoperative diagnosis:  1. Lung cancer  Postoperative diagnosis:  Same as above  Procedures: #1. Ultrasound guidance for vascular access to the left internal jugular vein. #2. Fluoroscopic guidance for placement of catheter. #3. Placement of CT compatible Port-A-Cath, left internal jugular vein.  Surgeon: Leotis Pain, MD.   Anesthesia: Local with moderate conscious sedation for approximately 25  minutes using 4 mg of Versed and 100 mcg of Fentanyl  Fluoroscopy time: less than 1 minute  Contrast used: 0  Estimated blood loss: 15 cc  Indication for the procedure:  The patient is a 75 y.o.female with right upper lobe lung cancer.  The patient needs a Port-A-Cath for durable venous access, chemotherapy, lab draws, and CT scans. We are asked to place this. Risks and benefits were discussed and informed consent was obtained.  Description of procedure: The patient was brought to the vascular and interventional radiology suite.  Moderate conscious sedation was administered throughout the procedure during a face to face encounter with the patient with my supervision of the RN administering medicines and monitoring the patient's vital signs, pulse oximetry, telemetry and mental status throughout from the start of the procedure until the patient was taken to the recovery room. The left neck chest and shoulder were sterilely prepped and draped, and a sterile surgical field was created. Ultrasound was used to help visualize a patent left internal jugular vein. This was then accessed under direct ultrasound guidance without difficulty with the Seldinger needle and a permanent image was recorded. A J-wire was placed. After skin nick and dilatation, the peel-away sheath was then placed over the wire. I then anesthetized an area under the clavicle approximately 1-2 fingerbreadths. A transverse incision was created and an inferior  pocket was created with electrocautery and blunt dissection. The port was then brought onto the field, placed into the pocket and secured to the chest wall with 2 Prolene sutures. The catheter was connected to the port and tunneled from the subclavicular incision to the access site. Fluoroscopic guidance was then used to cut the catheter to an appropriate length. The catheter was then placed through the peel-away sheath and the peel-away sheath was removed. The catheter tip was parked in excellent location under fluorocoscopic guidance in the SVC just above the right atrium. The pocket was then irrigated with antibiotic impregnated saline and the wound was closed with a running 3-0 Vicryl and a 4-0 Monocryl. The access incision was closed with a single 4-0 Monocryl. The Huber needle was used to withdraw blood and flush the port with heparinized saline. Dermabond was then placed as a dressing. The patient tolerated the procedure well and was taken to the recovery room in stable condition.   Abdelrahman Nair 12/04/2015 8:57 AM

## 2015-12-04 NOTE — Discharge Instructions (Signed)

## 2015-12-04 NOTE — Telephone Encounter (Signed)
escribed today.  Thank you.

## 2015-12-04 NOTE — H&P (Signed)
  [facility] History & Physical Update  The patient was interviewed and re-examined.  The patient's previous History and Physical has been reviewed and is unchanged.  There is no change in the plan of care. We plan to proceed with the scheduled procedure.  DEW,JASON, MD  12/04/2015, 8:10 AM

## 2015-12-05 ENCOUNTER — Telehealth: Payer: Self-pay | Admitting: *Deleted

## 2015-12-05 ENCOUNTER — Other Ambulatory Visit: Payer: Self-pay | Admitting: *Deleted

## 2015-12-05 ENCOUNTER — Encounter: Payer: Self-pay | Admitting: Vascular Surgery

## 2015-12-05 DIAGNOSIS — C3411 Malignant neoplasm of upper lobe, right bronchus or lung: Secondary | ICD-10-CM

## 2015-12-05 MED ORDER — ONDANSETRON HCL 8 MG PO TABS: 8.0000 mg | ORAL_TABLET | Freq: Two times a day (BID) | ORAL | 1 refills | Status: DC | PRN

## 2015-12-05 MED ORDER — LIDOCAINE-PRILOCAINE 2.5-2.5 % EX CREA: TOPICAL_CREAM | CUTANEOUS | 3 refills | Status: DC

## 2015-12-05 MED ORDER — PROCHLORPERAZINE MALEATE 10 MG PO TABS: 10.0000 mg | ORAL_TABLET | Freq: Four times a day (QID) | ORAL | 1 refills | Status: DC | PRN

## 2015-12-05 NOTE — Telephone Encounter (Signed)
Thank you :)

## 2015-12-05 NOTE — Telephone Encounter (Signed)
called to state that her med is not at pharmacy, I checked ot it was sent to mail order pharmacy, so I reordered to [facility] in [place]

## 2015-12-09 NOTE — Progress Notes (Signed)
[place]  [phone] [phone]  ID: [name] [name] OB: 1940-11-20  MR#: [id number]  BZJ#:[id number]  Patient Care Team: Jerrol Banana., MD as PCP - General (Unknown Physician Specialty) [name], MD as Consulting Physician (Cardiology) Algernon Huxley, MD as Consulting Physician (Vascular Surgery)  CHIEF COMPLAINT: Clinical stage IIIa small cell lung carcinoma of the right upper lobe lung.  INTERVAL HISTORY: Patient returns to clinic today for further evaluation and initiation of cycle 1 of 4 of cisplatin and etoposide. She continues to have a chronic cough and shortness of breath, but otherwise feels well. She has no neurologic complaints. She denies any recent fevers or illnesses. She has a good appetite and denies weight loss. She has no chest pain. She denies any nausea, vomiting, constipation, or diarrhea. She has no urinary complaints. Patient otherwise feels well and offers no further specific complaints.  REVIEW OF SYSTEMS:   Review of Systems  Constitutional: Negative.  Negative for fever, malaise/fatigue and weight loss.  Respiratory: Positive for cough and shortness of breath.   Cardiovascular: Negative.  Negative for chest pain.  Gastrointestinal: Negative.  Negative for abdominal pain.  Musculoskeletal: Negative.   Neurological: Negative.   Psychiatric/Behavioral: Negative.  The patient is not nervous/anxious.     As per HPI. Otherwise, a complete review of systems is negatve.  PAST MEDICAL HISTORY: Past Medical History:  Diagnosis Date  . Abnormal CT lung screening 10/17/2015  . COPD (chronic obstructive pulmonary disease) ([place])   . Coronary artery disease, non-occlusive    a. cath 2006: min nonobs CAD; b. cath 12/2010: cath LAD 50%, RCA 60%; c. 08/2013: Minimal luminal irregs, right dominant system with no significant CAD, diffuse luminal irregs noted. Normal EF 55%, no AS or MS.   . Diabetes mellitus   .  Hyperlipemia    Followed by Dr. Rosanna Randy  . Hypertension   . Lung cancer ([place])   . Macular degeneration    rt  . Personal history of tobacco use, presenting hazards to health 10/15/2015  . Pneumonia    hx  . Shortness of breath   . Stroke [facility])     PAST SURGICAL HISTORY: Past Surgical History:  Procedure Laterality Date  . ABDOMINAL HYSTERECTOMY    . ANTERIOR CERVICAL DECOMP/DISCECTOMY FUSION N/A 10/19/2013   Procedure: CERVICAL FIVE-SIX ANTERIOR CERVICAL DECOMPRESSION WITH FUSION INTERBODY PROSTHESIS PLATING AND PEEK CAGE;  Surgeon: Ophelia Charter, MD;  Location: [place] NEURO ORS;  Service: Neurosurgery;  Laterality: N/A;  . BACK SURGERY  80's  . BREAST CYST EXCISION Left    left negative   . CARDIAC CATHETERIZATION  05/2004  . CATARACT EXTRACTION Left   . CHOLECYSTECTOMY    . ENDARTERECTOMY Left 10/24/2014   Procedure: ENDARTERECTOMY CAROTID;  Surgeon: Algernon Huxley, MD;  Location: [facility] ORS;  Service: Vascular;  Laterality: Left;  . ENDOBRONCHIAL ULTRASOUND N/A 11/14/2015   Procedure: ENDOBRONCHIAL ULTRASOUND;  Surgeon: [name], MD;  Location: [facility] ORS;  Service: Cardiopulmonary;  Laterality: N/A;  . PERIPHERAL VASCULAR CATHETERIZATION N/A 12/04/2015   Procedure: Glori Luis Cath Insertion;  Surgeon: Algernon Huxley, MD;  Location: [place] CV LAB;  Service: Cardiovascular;  Laterality: N/A;  . TONSILLECTOMY AND ADENOIDECTOMY    . VESICOVAGINAL FISTULA CLOSURE W/ TAH      FAMILY HISTORY: Family History  Problem Relation Age of Onset  . Stroke Mother     Massive  . Heart attack Father     Massive  . Heart  attack Brother   . Heart attack Brother   . Lung cancer Maternal Grandfather   . Heart attack Paternal Grandmother     MI       ADVANCED DIRECTIVES (Y/N):  N   HEALTH MAINTENANCE: Social History  Substance Use Topics  . Smoking status: Former Smoker    Packs/day: 1.00    Years: 50.00    Types: Cigarettes    Quit date: 10/03/2015  . Smokeless tobacco: Never Used      Comment: smokes 3 cigs daily 05/06/15. Pt instructed to quit.  . Alcohol use No     Colonoscopy:  PAP:  Bone density:  Lipid panel:  Allergies  Allergen Reactions  . Coconut Fatty Acids Swelling    Throat swells    Current Outpatient Prescriptions  Medication Sig Dispense Refill  . albuterol (PROVENTIL) (5 MG/ML) 0.5% nebulizer solution Take 2.5 mg by nebulization 2 (two) times daily.    Marland Kitchen aspirin 81 MG tablet Take 81 mg by mouth every morning.     Marland Kitchen atorvastatin (LIPITOR) 20 MG tablet Take 1 tablet by mouth at  bedtime 90 tablet 3  . budesonide-formoterol (SYMBICORT) 160-4.5 MCG/ACT inhaler Inhale 2 puffs into the lungs 2 (two) times daily. 1 Inhaler 0  . clopidogrel (PLAVIX) 75 MG tablet     . diazepam (VALIUM) 5 MG tablet Take 1 tablet (5 mg total) by mouth every 6 (six) hours as needed for muscle spasms. 50 tablet 1  . gabapentin (NEURONTIN) 600 MG tablet Take 1 tablet by mouth two  times daily 180 tablet 3  . glimepiride (AMARYL) 4 MG tablet Take 1 tablet by mouth two  times daily 180 tablet 3  . isosorbide mononitrate (IMDUR) 30 MG 24 hr tablet Take 1 tablet by mouth  daily 90 tablet 3  . lidocaine-prilocaine (EMLA) cream Apply to port 1-2 hours prior to chemotherapy appointment. Cover with plastic wrap. 90 g 3  . losartan-hydrochlorothiazide (HYZAAR) 100-25 MG tablet Take 1 tablet by mouth  daily 90 tablet 3  . magnesium oxide (MAG-OX) 400 (241.3 MG) MG tablet Take 1 tablet by mouth 2 (two) times daily.    . metFORMIN (GLUCOPHAGE) 1000 MG tablet Take 1 tablet by mouth  every day 90 tablet 3  . Multiple Vitamins-Minerals (ICAPS) CAPS Take 2 capsules by mouth daily. Reported on 04/17/2015    . naproxen sodium (ANAPROX) 220 MG tablet Take 220 mg by mouth as needed. Takes twice a week as needed.    . nitroGLYCERIN (NITROSTAT) 0.4 MG SL tablet Place 1 tablet (0.4 mg total) under the tongue every 5 (five) minutes as needed for chest pain. 25 tablet 3  . ondansetron (ZOFRAN) 8 MG  tablet Take 1 tablet (8 mg total) by mouth 2 (two) times daily as needed. 30 tablet 1  . oxymetazoline (NO DRIP NASAL SPRAY) 0.05 % nasal spray Place 1 spray into both nostrils 2 (two) times daily.    . pantoprazole (PROTONIX) 40 MG tablet Take 1 tablet by mouth  daily 90 tablet 3  . pioglitazone (ACTOS) 30 MG tablet Take 1 tablet (30 mg total) by mouth daily. 90 tablet 3  . potassium chloride (K-DUR) 10 MEQ tablet Take 3 tablets by mouth  daily as directed 270 tablet 1  . prochlorperazine (COMPAZINE) 10 MG tablet Take 1 tablet (10 mg total) by mouth every 6 (six) hours as needed (Nausea or vomiting). 30 tablet 1   No current facility-administered medications for this visit.    Facility-Administered  Medications Ordered in Other Visits  Medication Dose Route Frequency Provider Last Rate Last Dose  . heparin lock flush 100 unit/mL  500 Units Intravenous Once Lloyd Huger, MD        OBJECTIVE: Vitals:   12/10/15 0922  BP: 140/65  Pulse: 69  Temp: 97.4 F (36.3 C)     Body mass index is 29.27 kg/m.    ECOG FS:0 - Asymptomatic  General: Well-developed, well-nourished, no acute distress. Eyes: Pink conjunctiva, anicteric sclera. HEENT: Normocephalic, moist mucous membranes, clear oropharnyx. Lungs: Clear to auscultation bilaterally. Heart: Regular rate and rhythm. No rubs, murmurs, or gallops. Abdomen: Soft, nontender, nondistended. No organomegaly noted, normoactive bowel sounds. Musculoskeletal: No edema, cyanosis, or clubbing. Neuro: Alert, answering all questions appropriately. Cranial nerves grossly intact. Skin: No rashes or petechiae noted. Psych: Normal affect. Lymphatics: No cervical, calvicular, axillary or inguinal LAD.   LAB RESULTS:  Lab Results  Component Value Date   NA 134 (L) 12/10/2015   K 4.2 12/10/2015   CL 98 (L) 12/10/2015   CO2 28 12/10/2015   GLUCOSE 254 (H) 12/10/2015   BUN 22 (H) 12/10/2015   CREATININE 1.09 (H) 12/10/2015   CALCIUM 8.7 (L)  12/10/2015   PROT 6.9 12/10/2015   ALBUMIN 3.7 12/10/2015   AST 19 12/10/2015   ALT 19 12/10/2015   ALKPHOS 73 12/10/2015   BILITOT 0.6 12/10/2015   GFRNONAA 49 (L) 12/10/2015   GFRAA 57 (L) 12/10/2015    Lab Results  Component Value Date   WBC 6.8 12/10/2015   NEUTROABS 4.7 12/10/2015   HGB 11.9 (L) 12/10/2015   HCT 34.5 (L) 12/10/2015   MCV 90.6 12/10/2015   PLT 230 12/10/2015     STUDIES: Mr Jeri Cos OY Contrast  Result Date: 11/20/2015 CLINICAL DATA:  Lung cancer. Staging.No reported neurologic symptoms. EXAM: MRI HEAD WITHOUT AND WITH CONTRAST TECHNIQUE: Multiplanar, multiecho pulse sequences of the brain and surrounding structures were obtained without and with intravenous contrast. CONTRAST:  65m MULTIHANCE GADOBENATE DIMEGLUMINE 529 MG/ML IV SOLN COMPARISON:  MR brain 08/30/2014. FINDINGS: No evidence for acute infarction, hemorrhage, mass lesion, hydrocephalus, or extra-axial fluid. Mild atrophy, not unexpected for age. Minor white matter disease, not unexpected for age. Flow voids are maintained throughout the carotid, basilar, and vertebral arteries. There are no areas of chronic hemorrhage. Post infusion, no abnormal enhancement of the brain or meninges. Visualized calvarium, skull base, and upper cervical osseous structures unremarkable. Scalp and extracranial soft tissues, orbits, and mastoids show no acute process. Layering fluid both divisions sphenoid, suggesting acuity. LEFT cataract extraction. IMPRESSION: No acute intracranial findings are evident. No abnormal enhancement to suggest metastatic disease. Suspected acute sphenoid sinusitis. Electronically Signed   By: JStaci RighterM.D.   On: 11/20/2015 15:17    ASSESSMENT: Clinical stage IIIa small cell lung carcinoma of the right upper lobe lung.  PLAN:    1. Clinical stage IIIa small cell lung carcinoma of the right upper lobe lung: CT and PET scan results reviewed independently concerning for stage IIIa disease  given patient's 11 mm right paratracheal lymph node positive for small cell lung cancer. Patient has no other evidence of metastatic disease. MRI the brain is negative. Patient will benefit from concurrent chemotherapy and XRT. She will initiate daily XRT on December 12, 2015. Proceed with cycle 1 of 4 of cisplatin on day 1 and etoposide on days 1 and 2 and 3 today. Will add in Neulasta support if necessary at the conclusion of her XRT.  Plan to reimage with PET scan after the conclusion of cycle 4. Return to clinic in 1 and 2 days for etoposide only, in 1 week for laboratory work, and then in 3 weeks for consideration of cycle 2. 2. Cough: Continue OTC treatments as needed. 3. Hyperglycemia: Monitor closely during chemotherapy since patient will be getting dexamethasone as a premedication.   Patient expressed understanding and was in agreement with this plan. She also understands that She can call clinic at any time with any questions, concerns, or complaints.   Primary cancer of right upper lobe of lung [facility])   Staging form: Lung, AJCC 7th Edition   - Clinical stage from 11/26/2015: Stage IIIA (T1a, N2, M0) - Signed by Lloyd Huger, MD on 11/26/2015  Lloyd Huger, MD   12/10/2015 9:51 AM

## 2015-12-10 ENCOUNTER — Inpatient Hospital Stay (HOSPITAL_BASED_OUTPATIENT_CLINIC_OR_DEPARTMENT_OTHER): Payer: Medicare Other | Admitting: Oncology

## 2015-12-10 ENCOUNTER — Inpatient Hospital Stay: Payer: Medicare Other | Attending: Oncology

## 2015-12-10 ENCOUNTER — Encounter: Payer: Self-pay | Admitting: Oncology

## 2015-12-10 ENCOUNTER — Inpatient Hospital Stay: Payer: Medicare Other

## 2015-12-10 VITALS — BP 140/65 | HR 69 | Temp 97.4°F | Wt 170.5 lb

## 2015-12-10 DIAGNOSIS — Z8701 Personal history of pneumonia (recurrent): Secondary | ICD-10-CM | POA: Insufficient documentation

## 2015-12-10 DIAGNOSIS — D649 Anemia, unspecified: Secondary | ICD-10-CM | POA: Diagnosis not present

## 2015-12-10 DIAGNOSIS — R0602 Shortness of breath: Secondary | ICD-10-CM

## 2015-12-10 DIAGNOSIS — Z79899 Other long term (current) drug therapy: Secondary | ICD-10-CM | POA: Diagnosis not present

## 2015-12-10 DIAGNOSIS — C3411 Malignant neoplasm of upper lobe, right bronchus or lung: Secondary | ICD-10-CM | POA: Insufficient documentation

## 2015-12-10 DIAGNOSIS — I10 Essential (primary) hypertension: Secondary | ICD-10-CM

## 2015-12-10 DIAGNOSIS — Z7984 Long term (current) use of oral hypoglycemic drugs: Secondary | ICD-10-CM

## 2015-12-10 DIAGNOSIS — Z801 Family history of malignant neoplasm of trachea, bronchus and lung: Secondary | ICD-10-CM

## 2015-12-10 DIAGNOSIS — Z5111 Encounter for antineoplastic chemotherapy: Secondary | ICD-10-CM | POA: Diagnosis not present

## 2015-12-10 DIAGNOSIS — R112 Nausea with vomiting, unspecified: Secondary | ICD-10-CM | POA: Diagnosis not present

## 2015-12-10 DIAGNOSIS — Z7982 Long term (current) use of aspirin: Secondary | ICD-10-CM | POA: Diagnosis not present

## 2015-12-10 DIAGNOSIS — J449 Chronic obstructive pulmonary disease, unspecified: Secondary | ICD-10-CM | POA: Insufficient documentation

## 2015-12-10 DIAGNOSIS — E1165 Type 2 diabetes mellitus with hyperglycemia: Secondary | ICD-10-CM | POA: Diagnosis not present

## 2015-12-10 DIAGNOSIS — E785 Hyperlipidemia, unspecified: Secondary | ICD-10-CM | POA: Insufficient documentation

## 2015-12-10 DIAGNOSIS — Z8673 Personal history of transient ischemic attack (TIA), and cerebral infarction without residual deficits: Secondary | ICD-10-CM

## 2015-12-10 DIAGNOSIS — I2510 Atherosclerotic heart disease of native coronary artery without angina pectoris: Secondary | ICD-10-CM | POA: Diagnosis not present

## 2015-12-10 DIAGNOSIS — R05 Cough: Secondary | ICD-10-CM

## 2015-12-10 DIAGNOSIS — E119 Type 2 diabetes mellitus without complications: Secondary | ICD-10-CM | POA: Diagnosis not present

## 2015-12-10 DIAGNOSIS — Z87891 Personal history of nicotine dependence: Secondary | ICD-10-CM | POA: Insufficient documentation

## 2015-12-10 DIAGNOSIS — Z510 Encounter for antineoplastic radiation therapy: Secondary | ICD-10-CM | POA: Diagnosis not present

## 2015-12-10 LAB — CBC WITH DIFFERENTIAL/PLATELET
Basophils Absolute: 0 10*3/uL (ref 0–0.1)
Basophils Relative: 0 %
EOS ABS: 0.2 10*3/uL (ref 0–0.7)
EOS PCT: 3 %
HEMATOCRIT: 34.5 % — AB (ref 35.0–47.0)
HEMOGLOBIN: 11.9 g/dL — AB (ref 12.0–16.0)
LYMPHS ABS: 1.3 10*3/uL (ref 1.0–3.6)
LYMPHS PCT: 20 %
MCH: 31.2 pg (ref 26.0–34.0)
MCHC: 34.4 g/dL (ref 32.0–36.0)
MCV: 90.6 fL (ref 80.0–100.0)
MONO ABS: 0.6 10*3/uL (ref 0.2–0.9)
Monocytes Relative: 9 %
Neutro Abs: 4.7 10*3/uL (ref 1.4–6.5)
Neutrophils Relative %: 68 %
Platelets: 230 10*3/uL (ref 150–440)
RBC: 3.81 MIL/uL (ref 3.80–5.20)
RDW: 14.4 % (ref 11.5–14.5)
WBC: 6.8 10*3/uL (ref 3.6–11.0)

## 2015-12-10 LAB — COMPREHENSIVE METABOLIC PANEL
ALBUMIN: 3.7 g/dL (ref 3.5–5.0)
ALK PHOS: 73 U/L (ref 38–126)
ALT: 19 U/L (ref 14–54)
ANION GAP: 8 (ref 5–15)
AST: 19 U/L (ref 15–41)
BILIRUBIN TOTAL: 0.6 mg/dL (ref 0.3–1.2)
BUN: 22 mg/dL — AB (ref 6–20)
CALCIUM: 8.7 mg/dL — AB (ref 8.9–10.3)
CO2: 28 mmol/L (ref 22–32)
Chloride: 98 mmol/L — ABNORMAL LOW (ref 101–111)
Creatinine, Ser: 1.09 mg/dL — ABNORMAL HIGH (ref 0.44–1.00)
GFR calc Af Amer: 57 mL/min — ABNORMAL LOW (ref >60)
GFR, EST NON AFRICAN AMERICAN: 49 mL/min — AB (ref >60)
GLUCOSE: 254 mg/dL — AB (ref 65–99)
Potassium: 4.2 mmol/L (ref 3.5–5.1)
Sodium: 134 mmol/L — ABNORMAL LOW (ref 135–145)
TOTAL PROTEIN: 6.9 g/dL (ref 6.5–8.1)

## 2015-12-10 MED ORDER — SODIUM CHLORIDE 0.9% FLUSH
10.0000 mL | Freq: Once | INTRAVENOUS | Status: AC
  Administered 2015-12-10: 10 mL via INTRAVENOUS
  Filled 2015-12-10: qty 10

## 2015-12-10 MED ORDER — HEPARIN SOD (PORK) LOCK FLUSH 100 UNIT/ML IV SOLN
500.0000 [IU] | Freq: Once | INTRAVENOUS | Status: AC
  Administered 2015-12-10: 500 [IU] via INTRAVENOUS
  Filled 2015-12-10: qty 5

## 2015-12-10 MED ORDER — SODIUM CHLORIDE 0.9 % IV SOLN
Freq: Once | INTRAVENOUS | Status: AC
  Administered 2015-12-10: 13:00:00 via INTRAVENOUS
  Filled 2015-12-10: qty 5

## 2015-12-10 MED ORDER — ETOPOSIDE CHEMO INJECTION 1 GM/50ML
80.0000 mg/m2 | Freq: Once | INTRAVENOUS | Status: AC
  Administered 2015-12-10: 150 mg via INTRAVENOUS
  Filled 2015-12-10: qty 7.5

## 2015-12-10 MED ORDER — PALONOSETRON HCL INJECTION 0.25 MG/5ML
0.2500 mg | Freq: Once | INTRAVENOUS | Status: AC
  Administered 2015-12-10: 0.25 mg via INTRAVENOUS
  Filled 2015-12-10: qty 5

## 2015-12-10 MED ORDER — POTASSIUM CHLORIDE 2 MEQ/ML IV SOLN
Freq: Once | INTRAVENOUS | Status: AC
  Administered 2015-12-10: 10:00:00 via INTRAVENOUS
  Filled 2015-12-10: qty 1000

## 2015-12-10 MED ORDER — SODIUM CHLORIDE 0.9 % IV SOLN
Freq: Once | INTRAVENOUS | Status: AC
  Administered 2015-12-10: 10:00:00 via INTRAVENOUS
  Filled 2015-12-10: qty 1000

## 2015-12-10 MED ORDER — SODIUM CHLORIDE 0.9 % IV SOLN
80.0000 mg/m2 | Freq: Once | INTRAVENOUS | Status: AC
  Administered 2015-12-10: 148 mg via INTRAVENOUS
  Filled 2015-12-10: qty 50

## 2015-12-11 ENCOUNTER — Inpatient Hospital Stay: Payer: Medicare Other

## 2015-12-11 VITALS — BP 137/61 | HR 70 | Temp 96.9°F | Resp 18

## 2015-12-11 DIAGNOSIS — Z79899 Other long term (current) drug therapy: Secondary | ICD-10-CM | POA: Diagnosis not present

## 2015-12-11 DIAGNOSIS — C3411 Malignant neoplasm of upper lobe, right bronchus or lung: Secondary | ICD-10-CM | POA: Diagnosis not present

## 2015-12-11 DIAGNOSIS — D649 Anemia, unspecified: Secondary | ICD-10-CM | POA: Diagnosis not present

## 2015-12-11 DIAGNOSIS — Z5111 Encounter for antineoplastic chemotherapy: Secondary | ICD-10-CM | POA: Diagnosis not present

## 2015-12-11 DIAGNOSIS — I10 Essential (primary) hypertension: Secondary | ICD-10-CM | POA: Diagnosis not present

## 2015-12-11 DIAGNOSIS — E785 Hyperlipidemia, unspecified: Secondary | ICD-10-CM | POA: Diagnosis not present

## 2015-12-11 DIAGNOSIS — R05 Cough: Secondary | ICD-10-CM | POA: Diagnosis not present

## 2015-12-11 DIAGNOSIS — R112 Nausea with vomiting, unspecified: Secondary | ICD-10-CM | POA: Diagnosis not present

## 2015-12-11 DIAGNOSIS — Z7982 Long term (current) use of aspirin: Secondary | ICD-10-CM | POA: Diagnosis not present

## 2015-12-11 DIAGNOSIS — J449 Chronic obstructive pulmonary disease, unspecified: Secondary | ICD-10-CM | POA: Diagnosis not present

## 2015-12-11 DIAGNOSIS — Z7984 Long term (current) use of oral hypoglycemic drugs: Secondary | ICD-10-CM | POA: Diagnosis not present

## 2015-12-11 DIAGNOSIS — Z8673 Personal history of transient ischemic attack (TIA), and cerebral infarction without residual deficits: Secondary | ICD-10-CM | POA: Diagnosis not present

## 2015-12-11 DIAGNOSIS — I2510 Atherosclerotic heart disease of native coronary artery without angina pectoris: Secondary | ICD-10-CM | POA: Diagnosis not present

## 2015-12-11 DIAGNOSIS — R0602 Shortness of breath: Secondary | ICD-10-CM | POA: Diagnosis not present

## 2015-12-11 DIAGNOSIS — E1165 Type 2 diabetes mellitus with hyperglycemia: Secondary | ICD-10-CM | POA: Diagnosis not present

## 2015-12-11 DIAGNOSIS — Z87891 Personal history of nicotine dependence: Secondary | ICD-10-CM | POA: Diagnosis not present

## 2015-12-11 DIAGNOSIS — Z801 Family history of malignant neoplasm of trachea, bronchus and lung: Secondary | ICD-10-CM | POA: Diagnosis not present

## 2015-12-11 MED ORDER — HEPARIN SOD (PORK) LOCK FLUSH 100 UNIT/ML IV SOLN
500.0000 [IU] | Freq: Once | INTRAVENOUS | Status: AC | PRN
  Administered 2015-12-11: 500 [IU]
  Filled 2015-12-11: qty 5

## 2015-12-11 MED ORDER — SODIUM CHLORIDE 0.9 % IV SOLN
Freq: Once | INTRAVENOUS | Status: AC
  Administered 2015-12-11: 15:00:00 via INTRAVENOUS
  Filled 2015-12-11: qty 1000

## 2015-12-11 MED ORDER — SODIUM CHLORIDE 0.9 % IV SOLN
80.0000 mg/m2 | Freq: Once | INTRAVENOUS | Status: AC
  Administered 2015-12-11: 150 mg via INTRAVENOUS
  Filled 2015-12-11: qty 7.5

## 2015-12-11 MED ORDER — SODIUM CHLORIDE 0.9 % IV SOLN
10.0000 mg | Freq: Once | INTRAVENOUS | Status: AC
  Administered 2015-12-11: 10 mg via INTRAVENOUS
  Filled 2015-12-11: qty 1

## 2015-12-12 ENCOUNTER — Ambulatory Visit
Admission: RE | Admit: 2015-12-12 | Discharge: 2015-12-12 | Disposition: A | Discharge status: Home / Self Care | Payer: Medicare Other | Source: Ambulatory Visit | Attending: Radiation Oncology | Admitting: Radiation Oncology

## 2015-12-12 ENCOUNTER — Inpatient Hospital Stay: Payer: Medicare Other

## 2015-12-12 VITALS — BP 137/68 | HR 80 | Temp 98.6°F | Resp 18

## 2015-12-12 DIAGNOSIS — Z7982 Long term (current) use of aspirin: Secondary | ICD-10-CM | POA: Diagnosis not present

## 2015-12-12 DIAGNOSIS — Z79899 Other long term (current) drug therapy: Secondary | ICD-10-CM | POA: Diagnosis not present

## 2015-12-12 DIAGNOSIS — C3411 Malignant neoplasm of upper lobe, right bronchus or lung: Secondary | ICD-10-CM

## 2015-12-12 DIAGNOSIS — E1165 Type 2 diabetes mellitus with hyperglycemia: Secondary | ICD-10-CM | POA: Diagnosis not present

## 2015-12-12 DIAGNOSIS — I10 Essential (primary) hypertension: Secondary | ICD-10-CM | POA: Diagnosis not present

## 2015-12-12 DIAGNOSIS — R112 Nausea with vomiting, unspecified: Secondary | ICD-10-CM | POA: Diagnosis not present

## 2015-12-12 DIAGNOSIS — R0602 Shortness of breath: Secondary | ICD-10-CM | POA: Diagnosis not present

## 2015-12-12 DIAGNOSIS — Z801 Family history of malignant neoplasm of trachea, bronchus and lung: Secondary | ICD-10-CM | POA: Diagnosis not present

## 2015-12-12 DIAGNOSIS — E785 Hyperlipidemia, unspecified: Secondary | ICD-10-CM | POA: Diagnosis not present

## 2015-12-12 DIAGNOSIS — Z5111 Encounter for antineoplastic chemotherapy: Secondary | ICD-10-CM | POA: Diagnosis not present

## 2015-12-12 DIAGNOSIS — Z7984 Long term (current) use of oral hypoglycemic drugs: Secondary | ICD-10-CM | POA: Diagnosis not present

## 2015-12-12 DIAGNOSIS — D649 Anemia, unspecified: Secondary | ICD-10-CM | POA: Diagnosis not present

## 2015-12-12 DIAGNOSIS — J449 Chronic obstructive pulmonary disease, unspecified: Secondary | ICD-10-CM | POA: Diagnosis not present

## 2015-12-12 DIAGNOSIS — Z8673 Personal history of transient ischemic attack (TIA), and cerebral infarction without residual deficits: Secondary | ICD-10-CM | POA: Diagnosis not present

## 2015-12-12 DIAGNOSIS — Z87891 Personal history of nicotine dependence: Secondary | ICD-10-CM | POA: Diagnosis not present

## 2015-12-12 DIAGNOSIS — R05 Cough: Secondary | ICD-10-CM | POA: Diagnosis not present

## 2015-12-12 DIAGNOSIS — I2510 Atherosclerotic heart disease of native coronary artery without angina pectoris: Secondary | ICD-10-CM | POA: Diagnosis not present

## 2015-12-12 MED ORDER — SODIUM CHLORIDE 0.9 % IV SOLN
Freq: Once | INTRAVENOUS | Status: AC
  Administered 2015-12-12: 14:00:00 via INTRAVENOUS
  Filled 2015-12-12: qty 1000

## 2015-12-12 MED ORDER — HEPARIN SOD (PORK) LOCK FLUSH 100 UNIT/ML IV SOLN
500.0000 [IU] | Freq: Once | INTRAVENOUS | Status: AC | PRN
  Administered 2015-12-12: 500 [IU]
  Filled 2015-12-12: qty 5

## 2015-12-12 MED ORDER — SODIUM CHLORIDE 0.9 % IV SOLN
10.0000 mg | Freq: Once | INTRAVENOUS | Status: AC
  Administered 2015-12-12: 10 mg via INTRAVENOUS
  Filled 2015-12-12: qty 1

## 2015-12-12 MED ORDER — SODIUM CHLORIDE 0.9 % IV SOLN
80.0000 mg/m2 | Freq: Once | INTRAVENOUS | Status: AC
  Administered 2015-12-12: 150 mg via INTRAVENOUS
  Filled 2015-12-12: qty 7.5

## 2015-12-12 MED ORDER — SODIUM CHLORIDE 0.9% FLUSH
10.0000 mL | INTRAVENOUS | Status: DC | PRN
  Administered 2015-12-12: 10 mL
  Filled 2015-12-12: qty 10

## 2015-12-16 ENCOUNTER — Ambulatory Visit
Admission: RE | Admit: 2015-12-16 | Discharge: 2015-12-16 | Disposition: A | Discharge status: Home / Self Care | Payer: Medicare Other | Source: Ambulatory Visit | Attending: Radiation Oncology | Admitting: Radiation Oncology

## 2015-12-16 DIAGNOSIS — I2510 Atherosclerotic heart disease of native coronary artery without angina pectoris: Secondary | ICD-10-CM | POA: Diagnosis not present

## 2015-12-16 DIAGNOSIS — Z801 Family history of malignant neoplasm of trachea, bronchus and lung: Secondary | ICD-10-CM | POA: Diagnosis not present

## 2015-12-16 DIAGNOSIS — Z7984 Long term (current) use of oral hypoglycemic drugs: Secondary | ICD-10-CM | POA: Diagnosis not present

## 2015-12-16 DIAGNOSIS — E785 Hyperlipidemia, unspecified: Secondary | ICD-10-CM | POA: Diagnosis not present

## 2015-12-16 DIAGNOSIS — I10 Essential (primary) hypertension: Secondary | ICD-10-CM | POA: Diagnosis not present

## 2015-12-16 DIAGNOSIS — Z510 Encounter for antineoplastic radiation therapy: Secondary | ICD-10-CM | POA: Diagnosis not present

## 2015-12-16 DIAGNOSIS — Z79899 Other long term (current) drug therapy: Secondary | ICD-10-CM | POA: Diagnosis not present

## 2015-12-16 DIAGNOSIS — J449 Chronic obstructive pulmonary disease, unspecified: Secondary | ICD-10-CM | POA: Diagnosis not present

## 2015-12-16 DIAGNOSIS — E119 Type 2 diabetes mellitus without complications: Secondary | ICD-10-CM | POA: Diagnosis not present

## 2015-12-16 DIAGNOSIS — C3411 Malignant neoplasm of upper lobe, right bronchus or lung: Secondary | ICD-10-CM | POA: Diagnosis not present

## 2015-12-16 DIAGNOSIS — Z8673 Personal history of transient ischemic attack (TIA), and cerebral infarction without residual deficits: Secondary | ICD-10-CM | POA: Diagnosis not present

## 2015-12-16 DIAGNOSIS — Z87891 Personal history of nicotine dependence: Secondary | ICD-10-CM | POA: Diagnosis not present

## 2015-12-16 DIAGNOSIS — Z7982 Long term (current) use of aspirin: Secondary | ICD-10-CM | POA: Diagnosis not present

## 2015-12-16 DIAGNOSIS — R0602 Shortness of breath: Secondary | ICD-10-CM | POA: Diagnosis not present

## 2015-12-17 ENCOUNTER — Other Ambulatory Visit: Payer: Self-pay

## 2015-12-17 ENCOUNTER — Inpatient Hospital Stay: Payer: Medicare Other

## 2015-12-17 ENCOUNTER — Ambulatory Visit
Admission: RE | Admit: 2015-12-17 | Discharge: 2015-12-17 | Disposition: A | Discharge status: Home / Self Care | Payer: Medicare Other | Source: Ambulatory Visit | Attending: Radiation Oncology | Admitting: Radiation Oncology

## 2015-12-17 DIAGNOSIS — Z510 Encounter for antineoplastic radiation therapy: Secondary | ICD-10-CM | POA: Diagnosis not present

## 2015-12-17 DIAGNOSIS — J449 Chronic obstructive pulmonary disease, unspecified: Secondary | ICD-10-CM | POA: Diagnosis not present

## 2015-12-17 DIAGNOSIS — Z7982 Long term (current) use of aspirin: Secondary | ICD-10-CM | POA: Diagnosis not present

## 2015-12-17 DIAGNOSIS — D649 Anemia, unspecified: Secondary | ICD-10-CM | POA: Diagnosis not present

## 2015-12-17 DIAGNOSIS — R05 Cough: Secondary | ICD-10-CM | POA: Diagnosis not present

## 2015-12-17 DIAGNOSIS — E785 Hyperlipidemia, unspecified: Secondary | ICD-10-CM | POA: Diagnosis not present

## 2015-12-17 DIAGNOSIS — I10 Essential (primary) hypertension: Secondary | ICD-10-CM | POA: Diagnosis not present

## 2015-12-17 DIAGNOSIS — Z79899 Other long term (current) drug therapy: Secondary | ICD-10-CM | POA: Diagnosis not present

## 2015-12-17 DIAGNOSIS — Z801 Family history of malignant neoplasm of trachea, bronchus and lung: Secondary | ICD-10-CM | POA: Diagnosis not present

## 2015-12-17 DIAGNOSIS — E119 Type 2 diabetes mellitus without complications: Secondary | ICD-10-CM | POA: Diagnosis not present

## 2015-12-17 DIAGNOSIS — Z7984 Long term (current) use of oral hypoglycemic drugs: Secondary | ICD-10-CM | POA: Diagnosis not present

## 2015-12-17 DIAGNOSIS — I2510 Atherosclerotic heart disease of native coronary artery without angina pectoris: Secondary | ICD-10-CM | POA: Diagnosis not present

## 2015-12-17 DIAGNOSIS — C3411 Malignant neoplasm of upper lobe, right bronchus or lung: Secondary | ICD-10-CM

## 2015-12-17 DIAGNOSIS — R112 Nausea with vomiting, unspecified: Secondary | ICD-10-CM | POA: Diagnosis not present

## 2015-12-17 DIAGNOSIS — R0602 Shortness of breath: Secondary | ICD-10-CM | POA: Diagnosis not present

## 2015-12-17 DIAGNOSIS — Z87891 Personal history of nicotine dependence: Secondary | ICD-10-CM | POA: Diagnosis not present

## 2015-12-17 DIAGNOSIS — Z5111 Encounter for antineoplastic chemotherapy: Secondary | ICD-10-CM | POA: Diagnosis not present

## 2015-12-17 DIAGNOSIS — Z8673 Personal history of transient ischemic attack (TIA), and cerebral infarction without residual deficits: Secondary | ICD-10-CM | POA: Diagnosis not present

## 2015-12-17 DIAGNOSIS — E1165 Type 2 diabetes mellitus with hyperglycemia: Secondary | ICD-10-CM | POA: Diagnosis not present

## 2015-12-17 LAB — COMPREHENSIVE METABOLIC PANEL
ALK PHOS: 71 U/L (ref 38–126)
ALT: 18 U/L (ref 14–54)
ANION GAP: 9 (ref 5–15)
AST: 13 U/L — ABNORMAL LOW (ref 15–41)
Albumin: 3.8 g/dL (ref 3.5–5.0)
BILIRUBIN TOTAL: 0.6 mg/dL (ref 0.3–1.2)
BUN: 52 mg/dL — ABNORMAL HIGH (ref 6–20)
CALCIUM: 8.9 mg/dL (ref 8.9–10.3)
CO2: 27 mmol/L (ref 22–32)
Chloride: 96 mmol/L — ABNORMAL LOW (ref 101–111)
Creatinine, Ser: 1.77 mg/dL — ABNORMAL HIGH (ref 0.44–1.00)
GFR, EST AFRICAN AMERICAN: 32 mL/min — AB (ref >60)
GFR, EST NON AFRICAN AMERICAN: 27 mL/min — AB (ref >60)
Glucose, Bld: 266 mg/dL — ABNORMAL HIGH (ref 65–99)
POTASSIUM: 4.6 mmol/L (ref 3.5–5.1)
Sodium: 132 mmol/L — ABNORMAL LOW (ref 135–145)
TOTAL PROTEIN: 7.1 g/dL (ref 6.5–8.1)

## 2015-12-17 LAB — CBC WITH DIFFERENTIAL/PLATELET
BASOS PCT: 0 %
Basophils Absolute: 0 10*3/uL (ref 0–0.1)
Eosinophils Absolute: 0 10*3/uL (ref 0–0.7)
Eosinophils Relative: 1 %
HEMATOCRIT: 33.8 % — AB (ref 35.0–47.0)
HEMOGLOBIN: 11.5 g/dL — AB (ref 12.0–16.0)
LYMPHS ABS: 1.1 10*3/uL (ref 1.0–3.6)
LYMPHS PCT: 17 %
MCH: 30.8 pg (ref 26.0–34.0)
MCHC: 34 g/dL (ref 32.0–36.0)
MCV: 90.5 fL (ref 80.0–100.0)
MONO ABS: 0.1 10*3/uL — AB (ref 0.2–0.9)
MONOS PCT: 1 %
NEUTROS ABS: 5.1 10*3/uL (ref 1.4–6.5)
Neutrophils Relative %: 81 %
Platelets: 166 10*3/uL (ref 150–440)
RBC: 3.74 MIL/uL — ABNORMAL LOW (ref 3.80–5.20)
RDW: 14 % (ref 11.5–14.5)
WBC: 6.3 10*3/uL (ref 3.6–11.0)

## 2015-12-18 ENCOUNTER — Ambulatory Visit
Admission: RE | Admit: 2015-12-18 | Discharge: 2015-12-18 | Disposition: A | Discharge status: Home / Self Care | Payer: Medicare Other | Source: Ambulatory Visit | Attending: Radiation Oncology | Admitting: Radiation Oncology

## 2015-12-18 DIAGNOSIS — I10 Essential (primary) hypertension: Secondary | ICD-10-CM | POA: Diagnosis not present

## 2015-12-18 DIAGNOSIS — Z8673 Personal history of transient ischemic attack (TIA), and cerebral infarction without residual deficits: Secondary | ICD-10-CM | POA: Diagnosis not present

## 2015-12-18 DIAGNOSIS — C3411 Malignant neoplasm of upper lobe, right bronchus or lung: Secondary | ICD-10-CM | POA: Diagnosis not present

## 2015-12-18 DIAGNOSIS — E119 Type 2 diabetes mellitus without complications: Secondary | ICD-10-CM | POA: Diagnosis not present

## 2015-12-18 DIAGNOSIS — Z510 Encounter for antineoplastic radiation therapy: Secondary | ICD-10-CM | POA: Diagnosis not present

## 2015-12-18 DIAGNOSIS — Z7984 Long term (current) use of oral hypoglycemic drugs: Secondary | ICD-10-CM | POA: Diagnosis not present

## 2015-12-18 DIAGNOSIS — Z87891 Personal history of nicotine dependence: Secondary | ICD-10-CM | POA: Diagnosis not present

## 2015-12-18 DIAGNOSIS — E785 Hyperlipidemia, unspecified: Secondary | ICD-10-CM | POA: Diagnosis not present

## 2015-12-18 DIAGNOSIS — Z7982 Long term (current) use of aspirin: Secondary | ICD-10-CM | POA: Diagnosis not present

## 2015-12-18 DIAGNOSIS — J449 Chronic obstructive pulmonary disease, unspecified: Secondary | ICD-10-CM | POA: Diagnosis not present

## 2015-12-18 DIAGNOSIS — Z801 Family history of malignant neoplasm of trachea, bronchus and lung: Secondary | ICD-10-CM | POA: Diagnosis not present

## 2015-12-18 DIAGNOSIS — Z79899 Other long term (current) drug therapy: Secondary | ICD-10-CM | POA: Diagnosis not present

## 2015-12-18 DIAGNOSIS — R0602 Shortness of breath: Secondary | ICD-10-CM | POA: Diagnosis not present

## 2015-12-18 DIAGNOSIS — I2510 Atherosclerotic heart disease of native coronary artery without angina pectoris: Secondary | ICD-10-CM | POA: Diagnosis not present

## 2015-12-19 ENCOUNTER — Ambulatory Visit
Admission: RE | Admit: 2015-12-19 | Discharge: 2015-12-19 | Disposition: A | Discharge status: Home / Self Care | Payer: Medicare Other | Source: Ambulatory Visit | Attending: Radiation Oncology | Admitting: Radiation Oncology

## 2015-12-19 DIAGNOSIS — R0602 Shortness of breath: Secondary | ICD-10-CM | POA: Diagnosis not present

## 2015-12-19 DIAGNOSIS — E119 Type 2 diabetes mellitus without complications: Secondary | ICD-10-CM | POA: Diagnosis not present

## 2015-12-19 DIAGNOSIS — Z87891 Personal history of nicotine dependence: Secondary | ICD-10-CM | POA: Diagnosis not present

## 2015-12-19 DIAGNOSIS — Z801 Family history of malignant neoplasm of trachea, bronchus and lung: Secondary | ICD-10-CM | POA: Diagnosis not present

## 2015-12-19 DIAGNOSIS — Z510 Encounter for antineoplastic radiation therapy: Secondary | ICD-10-CM | POA: Diagnosis not present

## 2015-12-19 DIAGNOSIS — J449 Chronic obstructive pulmonary disease, unspecified: Secondary | ICD-10-CM | POA: Diagnosis not present

## 2015-12-19 DIAGNOSIS — I10 Essential (primary) hypertension: Secondary | ICD-10-CM | POA: Diagnosis not present

## 2015-12-19 DIAGNOSIS — C3411 Malignant neoplasm of upper lobe, right bronchus or lung: Secondary | ICD-10-CM | POA: Diagnosis not present

## 2015-12-19 DIAGNOSIS — I2510 Atherosclerotic heart disease of native coronary artery without angina pectoris: Secondary | ICD-10-CM | POA: Diagnosis not present

## 2015-12-19 DIAGNOSIS — Z7982 Long term (current) use of aspirin: Secondary | ICD-10-CM | POA: Diagnosis not present

## 2015-12-19 DIAGNOSIS — Z8673 Personal history of transient ischemic attack (TIA), and cerebral infarction without residual deficits: Secondary | ICD-10-CM | POA: Diagnosis not present

## 2015-12-19 DIAGNOSIS — Z7984 Long term (current) use of oral hypoglycemic drugs: Secondary | ICD-10-CM | POA: Diagnosis not present

## 2015-12-19 DIAGNOSIS — Z79899 Other long term (current) drug therapy: Secondary | ICD-10-CM | POA: Diagnosis not present

## 2015-12-19 DIAGNOSIS — E785 Hyperlipidemia, unspecified: Secondary | ICD-10-CM | POA: Diagnosis not present

## 2015-12-20 ENCOUNTER — Ambulatory Visit
Admission: RE | Admit: 2015-12-20 | Discharge: 2015-12-20 | Disposition: A | Discharge status: Home / Self Care | Payer: Medicare Other | Source: Ambulatory Visit | Attending: Radiation Oncology | Admitting: Radiation Oncology

## 2015-12-20 DIAGNOSIS — I10 Essential (primary) hypertension: Secondary | ICD-10-CM | POA: Diagnosis not present

## 2015-12-20 DIAGNOSIS — J449 Chronic obstructive pulmonary disease, unspecified: Secondary | ICD-10-CM | POA: Diagnosis not present

## 2015-12-20 DIAGNOSIS — E785 Hyperlipidemia, unspecified: Secondary | ICD-10-CM | POA: Diagnosis not present

## 2015-12-20 DIAGNOSIS — Z87891 Personal history of nicotine dependence: Secondary | ICD-10-CM | POA: Diagnosis not present

## 2015-12-20 DIAGNOSIS — R0602 Shortness of breath: Secondary | ICD-10-CM | POA: Diagnosis not present

## 2015-12-20 DIAGNOSIS — C3411 Malignant neoplasm of upper lobe, right bronchus or lung: Secondary | ICD-10-CM | POA: Diagnosis not present

## 2015-12-20 DIAGNOSIS — Z79899 Other long term (current) drug therapy: Secondary | ICD-10-CM | POA: Diagnosis not present

## 2015-12-20 DIAGNOSIS — Z801 Family history of malignant neoplasm of trachea, bronchus and lung: Secondary | ICD-10-CM | POA: Diagnosis not present

## 2015-12-20 DIAGNOSIS — E119 Type 2 diabetes mellitus without complications: Secondary | ICD-10-CM | POA: Diagnosis not present

## 2015-12-20 DIAGNOSIS — I2510 Atherosclerotic heart disease of native coronary artery without angina pectoris: Secondary | ICD-10-CM | POA: Diagnosis not present

## 2015-12-20 DIAGNOSIS — Z7984 Long term (current) use of oral hypoglycemic drugs: Secondary | ICD-10-CM | POA: Diagnosis not present

## 2015-12-20 DIAGNOSIS — Z8673 Personal history of transient ischemic attack (TIA), and cerebral infarction without residual deficits: Secondary | ICD-10-CM | POA: Diagnosis not present

## 2015-12-20 DIAGNOSIS — Z7982 Long term (current) use of aspirin: Secondary | ICD-10-CM | POA: Diagnosis not present

## 2015-12-20 DIAGNOSIS — Z510 Encounter for antineoplastic radiation therapy: Secondary | ICD-10-CM | POA: Diagnosis not present

## 2015-12-23 ENCOUNTER — Ambulatory Visit (INDEPENDENT_AMBULATORY_CARE_PROVIDER_SITE_OTHER): Payer: Medicare Other | Admitting: Family Medicine

## 2015-12-23 ENCOUNTER — Ambulatory Visit
Admission: RE | Admit: 2015-12-23 | Discharge: 2015-12-23 | Disposition: A | Discharge status: Home / Self Care | Payer: Medicare Other | Source: Ambulatory Visit | Attending: Radiation Oncology | Admitting: Radiation Oncology

## 2015-12-23 VITALS — BP 110/58 | HR 84 | Temp 97.7°F | Resp 18 | Wt 166.0 lb

## 2015-12-23 DIAGNOSIS — Z87891 Personal history of nicotine dependence: Secondary | ICD-10-CM | POA: Diagnosis not present

## 2015-12-23 DIAGNOSIS — E1342 Other specified diabetes mellitus with diabetic polyneuropathy: Secondary | ICD-10-CM

## 2015-12-23 DIAGNOSIS — Z79899 Other long term (current) drug therapy: Secondary | ICD-10-CM | POA: Diagnosis not present

## 2015-12-23 DIAGNOSIS — Z7984 Long term (current) use of oral hypoglycemic drugs: Secondary | ICD-10-CM | POA: Diagnosis not present

## 2015-12-23 DIAGNOSIS — E119 Type 2 diabetes mellitus without complications: Secondary | ICD-10-CM | POA: Diagnosis not present

## 2015-12-23 DIAGNOSIS — Z7982 Long term (current) use of aspirin: Secondary | ICD-10-CM | POA: Diagnosis not present

## 2015-12-23 DIAGNOSIS — I2510 Atherosclerotic heart disease of native coronary artery without angina pectoris: Secondary | ICD-10-CM | POA: Diagnosis not present

## 2015-12-23 DIAGNOSIS — C3411 Malignant neoplasm of upper lobe, right bronchus or lung: Secondary | ICD-10-CM | POA: Diagnosis not present

## 2015-12-23 DIAGNOSIS — I10 Essential (primary) hypertension: Secondary | ICD-10-CM | POA: Diagnosis not present

## 2015-12-23 DIAGNOSIS — Z801 Family history of malignant neoplasm of trachea, bronchus and lung: Secondary | ICD-10-CM | POA: Diagnosis not present

## 2015-12-23 DIAGNOSIS — Z8673 Personal history of transient ischemic attack (TIA), and cerebral infarction without residual deficits: Secondary | ICD-10-CM | POA: Diagnosis not present

## 2015-12-23 DIAGNOSIS — J449 Chronic obstructive pulmonary disease, unspecified: Secondary | ICD-10-CM | POA: Diagnosis not present

## 2015-12-23 DIAGNOSIS — R0602 Shortness of breath: Secondary | ICD-10-CM | POA: Diagnosis not present

## 2015-12-23 DIAGNOSIS — Z510 Encounter for antineoplastic radiation therapy: Secondary | ICD-10-CM | POA: Diagnosis not present

## 2015-12-23 DIAGNOSIS — E785 Hyperlipidemia, unspecified: Secondary | ICD-10-CM | POA: Diagnosis not present

## 2015-12-23 NOTE — Progress Notes (Signed)
[name] [name]  MRN: [id number] DOB: 1941/03/29  Subjective:  HPI  The patient is a 75 year old female who presents for follow up of her diabetes.  She was last seen on 08/19/15.  Her A1C at that time was 9.2.  She has been checking her glucose at home and getting readings of around 100-200.    Since her last visit she has been diagnosed with lung cancer.   She is currently receiving radiation and chemotherapy.  She states that she was not a candidate for surgery.  She will be continuing to have treatments until the end of December.  The patient states that her cancer doctor told her not to get her Flu shot yet.  They told her that they will take care of giving it to her when she is able.     Patient Active Problem List   Diagnosis Date Noted  . Primary cancer of right upper lobe of lung ([place]) 11/20/2015  . Adenopathy   . Abnormal CT lung screening 10/17/2015  . Personal history of tobacco use, presenting hazards to health 10/15/2015  . NSTEMI (non-ST elevated myocardial infarction) ([place]) 01/27/2015  . Chronic vulvitis 09/26/2014  . Allergic reaction 09/26/2014  . Carotid stenosis 08/30/2014  . Cervical nerve root disorder 08/10/2014  . Absolute anemia 08/10/2014  . CAD in native artery 08/10/2014  . B12 deficiency 08/10/2014  . Back ache 08/10/2014  . Bronchitis, chronic ([place]) 08/10/2014  . Diabetes mellitus with polyneuropathy ([place]) 08/10/2014  . Can't get food down 08/10/2014  . Eczema of external ear 08/10/2014  . Accumulation of fluid in tissues 08/10/2014  . Gout 08/10/2014  . Adult hypothyroidism 08/10/2014  . Mononeuritis 08/10/2014  . Muscle ache 08/10/2014  . Disorder of peripheral nervous system ([place]) 08/10/2014  . Lesion of vulva 08/10/2014  . Cervical spondylosis with radiculopathy 10/19/2013  . Unstable angina ([place]) 08/15/2013  . COPD exacerbation ([place]) 03/29/2013  . CAD (coronary artery disease) 06/22/2011  . COPD (chronic obstructive pulmonary disease)  with emphysema ([place]) 03/07/2010  . CHEST PAIN UNSPECIFIED 07/22/2009  . HLD (hyperlipidemia) 04/01/2009  . Malaise and fatigue 04/01/2009  . Hyperlipidemia 01/18/2009  . TOBACCO ABUSE 01/18/2009  . HYPERTENSION, BENIGN 01/18/2009  . CLAUDICATION 01/18/2009  . Pain in limb 01/18/2009  . CAFL (chronic airflow limitation) ([place]) 01/20/2007  . Late effects of cerebrovascular disease 01/10/2007  . Essential (primary) hypertension 12/22/2006    Past Medical History:  Diagnosis Date  . Abnormal CT lung screening 10/17/2015  . COPD (chronic obstructive pulmonary disease) ([place])   . Coronary artery disease, non-occlusive    a. cath 2006: min nonobs CAD; b. cath 12/2010: cath LAD 50%, RCA 60%; c. 08/2013: Minimal luminal irregs, right dominant system with no significant CAD, diffuse luminal irregs noted. Normal EF 55%, no AS or MS.   . Diabetes mellitus   . Hyperlipemia    Followed by Dr. Rosanna Randy  . Hypertension   . Lung cancer ([place])   . Macular degeneration    rt  . Personal history of tobacco use, presenting hazards to health 10/15/2015  . Pneumonia    hx  . Shortness of breath   . Stroke [facility])     Social History   Social History  . Marital status: Married    Spouse name: N/A  . Number of children: N/A  . Years of education: N/A   Occupational History  . Retired, Environmental consultant Retired   Social History Main Topics  . Smoking status:  Former Smoker    Packs/day: 1.00    Years: 50.00    Types: Cigarettes    Quit date: 10/03/2015  . Smokeless tobacco: Never Used     Comment: smokes 3 cigs daily 05/06/15. Pt instructed to quit.  . Alcohol use No  . Drug use: No  . Sexual activity: No   Other Topics Concern  . Not on file   Social History Narrative   Married with 4 children   Gets regular exercise    Outpatient Encounter Prescriptions as of 12/23/2015  Medication Sig Note  . albuterol (PROVENTIL) (5 MG/ML) 0.5% nebulizer solution Take 2.5 mg by nebulization 2 (two)  times daily.   Marland Kitchen aspirin 81 MG tablet Take 81 mg by mouth every morning.    Marland Kitchen atorvastatin (LIPITOR) 20 MG tablet Take 1 tablet by mouth at  bedtime   . budesonide-formoterol (SYMBICORT) 160-4.5 MCG/ACT inhaler Inhale 2 puffs into the lungs 2 (two) times daily.   . clopidogrel (PLAVIX) 75 MG tablet  12/10/2015: Received from: External Pharmacy  . diazepam (VALIUM) 5 MG tablet Take 1 tablet (5 mg total) by mouth every 6 (six) hours as needed for muscle spasms.   Marland Kitchen gabapentin (NEURONTIN) 600 MG tablet Take 1 tablet by mouth two  times daily   . glimepiride (AMARYL) 4 MG tablet Take 1 tablet by mouth two  times daily   . isosorbide mononitrate (IMDUR) 30 MG 24 hr tablet Take 1 tablet by mouth  daily   . lidocaine-prilocaine (EMLA) cream Apply to port 1-2 hours prior to chemotherapy appointment. Cover with plastic wrap.   . losartan-hydrochlorothiazide (HYZAAR) 100-25 MG tablet Take 1 tablet by mouth  daily   . magnesium oxide (MAG-OX) 400 (241.3 MG) MG tablet Take 1 tablet by mouth 2 (two) times daily.   . metFORMIN (GLUCOPHAGE) 1000 MG tablet Take 1 tablet by mouth  every day   . Multiple Vitamins-Minerals (ICAPS) CAPS Take 2 capsules by mouth daily. Reported on 04/17/2015   . naproxen sodium (ANAPROX) 220 MG tablet Take 220 mg by mouth as needed. Takes twice a week as needed.   . nitroGLYCERIN (NITROSTAT) 0.4 MG SL tablet Place 1 tablet (0.4 mg total) under the tongue every 5 (five) minutes as needed for chest pain. 05/23/2015: As needed.   . ondansetron (ZOFRAN) 8 MG tablet Take 1 tablet (8 mg total) by mouth 2 (two) times daily as needed.   Marland Kitchen oxymetazoline (NO DRIP NASAL SPRAY) 0.05 % nasal spray Place 1 spray into both nostrils 2 (two) times daily.   . pantoprazole (PROTONIX) 40 MG tablet Take 1 tablet by mouth  daily   . pioglitazone (ACTOS) 30 MG tablet Take 1 tablet (30 mg total) by mouth daily.   . potassium chloride (K-DUR) 10 MEQ tablet Take 3 tablets by mouth  daily as directed   .  prochlorperazine (COMPAZINE) 10 MG tablet Take 1 tablet (10 mg total) by mouth every 6 (six) hours as needed (Nausea or vomiting).    No facility-administered encounter medications on file as of 12/23/2015.     Allergies  Allergen Reactions  . Coconut Fatty Acids Swelling    Throat swells    Review of Systems  Constitutional: Negative.  Negative for chills, fever, malaise/fatigue and weight loss.  HENT: Negative.   Eyes: Negative.   Respiratory: Negative for cough, hemoptysis, sputum production, shortness of breath and wheezing.   Cardiovascular: Negative for chest pain, palpitations, orthopnea, claudication, leg swelling and PND.  Gastrointestinal: Negative.  Musculoskeletal: Negative.   Skin: Negative.   Neurological: Negative for dizziness, weakness and headaches.  Endo/Heme/Allergies: Negative.   Psychiatric/Behavioral: Negative.    Objective:  BP (!) 110/58   Pulse 84   Temp 97.7 F (36.5 C) (Oral)   Wt 166 lb (75.3 kg)   BMI 28.49 kg/m   Physical Exam  Constitutional: She is oriented to person, place, and time and well-developed, well-nourished, and in no distress.  HENT:  Head: Normocephalic and atraumatic.  Right Ear: External ear normal.  Left Ear: External ear normal.  Nose: Nose normal.  Eyes: Conjunctivae are normal. Pupils are equal, round, and reactive to light.  Neck: Normal range of motion. Neck supple.  Cardiovascular: Normal rate, regular rhythm and normal heart sounds.   Pulmonary/Chest: Effort normal and breath sounds normal.  Abdominal: Soft.  Neurological: She is alert and oriented to person, place, and time.  Skin: Skin is warm and dry.  Psychiatric: Mood, memory, affect and judgment normal.    Assessment and Plan :   1. Diabetic polyneuropathy associated with other specified diabetes mellitus ([place])  - POCT glycosylated hemoglobin (Hb A1C)  2. Lung cancer  3. COPD  4. ASCVD  HPI, Exam and A&P Transcribed under the direction and in the  presence of Miguel Aschoff, Brooke Bonito., MD. Electronically Signed: Althea Charon, RMA I have done the exam and reviewed the above chart and it is accurate to the best of my knowledge.

## 2015-12-24 ENCOUNTER — Ambulatory Visit
Admission: RE | Admit: 2015-12-24 | Discharge: 2015-12-24 | Disposition: A | Discharge status: Home / Self Care | Payer: Medicare Other | Source: Ambulatory Visit | Attending: Radiation Oncology | Admitting: Radiation Oncology

## 2015-12-24 DIAGNOSIS — E785 Hyperlipidemia, unspecified: Secondary | ICD-10-CM | POA: Diagnosis not present

## 2015-12-24 DIAGNOSIS — C3411 Malignant neoplasm of upper lobe, right bronchus or lung: Secondary | ICD-10-CM | POA: Diagnosis not present

## 2015-12-24 DIAGNOSIS — Z510 Encounter for antineoplastic radiation therapy: Secondary | ICD-10-CM | POA: Diagnosis not present

## 2015-12-24 DIAGNOSIS — Z8673 Personal history of transient ischemic attack (TIA), and cerebral infarction without residual deficits: Secondary | ICD-10-CM | POA: Diagnosis not present

## 2015-12-24 DIAGNOSIS — Z801 Family history of malignant neoplasm of trachea, bronchus and lung: Secondary | ICD-10-CM | POA: Diagnosis not present

## 2015-12-24 DIAGNOSIS — Z87891 Personal history of nicotine dependence: Secondary | ICD-10-CM | POA: Diagnosis not present

## 2015-12-24 DIAGNOSIS — I2510 Atherosclerotic heart disease of native coronary artery without angina pectoris: Secondary | ICD-10-CM | POA: Diagnosis not present

## 2015-12-24 DIAGNOSIS — Z7984 Long term (current) use of oral hypoglycemic drugs: Secondary | ICD-10-CM | POA: Diagnosis not present

## 2015-12-24 DIAGNOSIS — R0602 Shortness of breath: Secondary | ICD-10-CM | POA: Diagnosis not present

## 2015-12-24 DIAGNOSIS — I10 Essential (primary) hypertension: Secondary | ICD-10-CM | POA: Diagnosis not present

## 2015-12-24 DIAGNOSIS — Z79899 Other long term (current) drug therapy: Secondary | ICD-10-CM | POA: Diagnosis not present

## 2015-12-24 DIAGNOSIS — J449 Chronic obstructive pulmonary disease, unspecified: Secondary | ICD-10-CM | POA: Diagnosis not present

## 2015-12-24 DIAGNOSIS — E119 Type 2 diabetes mellitus without complications: Secondary | ICD-10-CM | POA: Diagnosis not present

## 2015-12-24 DIAGNOSIS — Z7982 Long term (current) use of aspirin: Secondary | ICD-10-CM | POA: Diagnosis not present

## 2015-12-25 ENCOUNTER — Ambulatory Visit
Admission: RE | Admit: 2015-12-25 | Discharge: 2015-12-25 | Disposition: A | Discharge status: Home / Self Care | Payer: Medicare Other | Source: Ambulatory Visit | Attending: Radiation Oncology | Admitting: Radiation Oncology

## 2015-12-25 DIAGNOSIS — Z8673 Personal history of transient ischemic attack (TIA), and cerebral infarction without residual deficits: Secondary | ICD-10-CM | POA: Diagnosis not present

## 2015-12-25 DIAGNOSIS — E119 Type 2 diabetes mellitus without complications: Secondary | ICD-10-CM | POA: Diagnosis not present

## 2015-12-25 DIAGNOSIS — Z801 Family history of malignant neoplasm of trachea, bronchus and lung: Secondary | ICD-10-CM | POA: Diagnosis not present

## 2015-12-25 DIAGNOSIS — E785 Hyperlipidemia, unspecified: Secondary | ICD-10-CM | POA: Diagnosis not present

## 2015-12-25 DIAGNOSIS — I10 Essential (primary) hypertension: Secondary | ICD-10-CM | POA: Diagnosis not present

## 2015-12-25 DIAGNOSIS — Z7982 Long term (current) use of aspirin: Secondary | ICD-10-CM | POA: Diagnosis not present

## 2015-12-25 DIAGNOSIS — Z7984 Long term (current) use of oral hypoglycemic drugs: Secondary | ICD-10-CM | POA: Diagnosis not present

## 2015-12-25 DIAGNOSIS — Z79899 Other long term (current) drug therapy: Secondary | ICD-10-CM | POA: Diagnosis not present

## 2015-12-25 DIAGNOSIS — R0602 Shortness of breath: Secondary | ICD-10-CM | POA: Diagnosis not present

## 2015-12-25 DIAGNOSIS — C3411 Malignant neoplasm of upper lobe, right bronchus or lung: Secondary | ICD-10-CM | POA: Diagnosis not present

## 2015-12-25 DIAGNOSIS — I2510 Atherosclerotic heart disease of native coronary artery without angina pectoris: Secondary | ICD-10-CM | POA: Diagnosis not present

## 2015-12-25 DIAGNOSIS — Z510 Encounter for antineoplastic radiation therapy: Secondary | ICD-10-CM | POA: Diagnosis not present

## 2015-12-25 DIAGNOSIS — Z87891 Personal history of nicotine dependence: Secondary | ICD-10-CM | POA: Diagnosis not present

## 2015-12-25 DIAGNOSIS — J449 Chronic obstructive pulmonary disease, unspecified: Secondary | ICD-10-CM | POA: Diagnosis not present

## 2015-12-26 ENCOUNTER — Ambulatory Visit
Admission: RE | Admit: 2015-12-26 | Discharge: 2015-12-26 | Disposition: A | Discharge status: Home / Self Care | Payer: Medicare Other | Source: Ambulatory Visit | Attending: Radiation Oncology | Admitting: Radiation Oncology

## 2015-12-26 DIAGNOSIS — I10 Essential (primary) hypertension: Secondary | ICD-10-CM | POA: Diagnosis not present

## 2015-12-26 DIAGNOSIS — C3411 Malignant neoplasm of upper lobe, right bronchus or lung: Secondary | ICD-10-CM | POA: Diagnosis not present

## 2015-12-26 DIAGNOSIS — I2510 Atherosclerotic heart disease of native coronary artery without angina pectoris: Secondary | ICD-10-CM | POA: Diagnosis not present

## 2015-12-26 DIAGNOSIS — Z8673 Personal history of transient ischemic attack (TIA), and cerebral infarction without residual deficits: Secondary | ICD-10-CM | POA: Diagnosis not present

## 2015-12-26 DIAGNOSIS — R0602 Shortness of breath: Secondary | ICD-10-CM | POA: Diagnosis not present

## 2015-12-26 DIAGNOSIS — Z7984 Long term (current) use of oral hypoglycemic drugs: Secondary | ICD-10-CM | POA: Diagnosis not present

## 2015-12-26 DIAGNOSIS — E785 Hyperlipidemia, unspecified: Secondary | ICD-10-CM | POA: Diagnosis not present

## 2015-12-26 DIAGNOSIS — Z87891 Personal history of nicotine dependence: Secondary | ICD-10-CM | POA: Diagnosis not present

## 2015-12-26 DIAGNOSIS — Z801 Family history of malignant neoplasm of trachea, bronchus and lung: Secondary | ICD-10-CM | POA: Diagnosis not present

## 2015-12-26 DIAGNOSIS — E119 Type 2 diabetes mellitus without complications: Secondary | ICD-10-CM | POA: Diagnosis not present

## 2015-12-26 DIAGNOSIS — Z7982 Long term (current) use of aspirin: Secondary | ICD-10-CM | POA: Diagnosis not present

## 2015-12-26 DIAGNOSIS — Z79899 Other long term (current) drug therapy: Secondary | ICD-10-CM | POA: Diagnosis not present

## 2015-12-26 DIAGNOSIS — Z510 Encounter for antineoplastic radiation therapy: Secondary | ICD-10-CM | POA: Diagnosis not present

## 2015-12-26 DIAGNOSIS — J449 Chronic obstructive pulmonary disease, unspecified: Secondary | ICD-10-CM | POA: Diagnosis not present

## 2015-12-27 ENCOUNTER — Ambulatory Visit
Admission: RE | Admit: 2015-12-27 | Discharge: 2015-12-27 | Disposition: A | Discharge status: Home / Self Care | Payer: Medicare Other | Source: Ambulatory Visit | Attending: Radiation Oncology | Admitting: Radiation Oncology

## 2015-12-27 DIAGNOSIS — Z801 Family history of malignant neoplasm of trachea, bronchus and lung: Secondary | ICD-10-CM | POA: Diagnosis not present

## 2015-12-27 DIAGNOSIS — I2510 Atherosclerotic heart disease of native coronary artery without angina pectoris: Secondary | ICD-10-CM | POA: Diagnosis not present

## 2015-12-27 DIAGNOSIS — R0602 Shortness of breath: Secondary | ICD-10-CM | POA: Diagnosis not present

## 2015-12-27 DIAGNOSIS — Z8673 Personal history of transient ischemic attack (TIA), and cerebral infarction without residual deficits: Secondary | ICD-10-CM | POA: Diagnosis not present

## 2015-12-27 DIAGNOSIS — C3411 Malignant neoplasm of upper lobe, right bronchus or lung: Secondary | ICD-10-CM | POA: Diagnosis not present

## 2015-12-27 DIAGNOSIS — E119 Type 2 diabetes mellitus without complications: Secondary | ICD-10-CM | POA: Diagnosis not present

## 2015-12-27 DIAGNOSIS — Z79899 Other long term (current) drug therapy: Secondary | ICD-10-CM | POA: Diagnosis not present

## 2015-12-27 DIAGNOSIS — Z7984 Long term (current) use of oral hypoglycemic drugs: Secondary | ICD-10-CM | POA: Diagnosis not present

## 2015-12-27 DIAGNOSIS — J449 Chronic obstructive pulmonary disease, unspecified: Secondary | ICD-10-CM | POA: Diagnosis not present

## 2015-12-27 DIAGNOSIS — Z87891 Personal history of nicotine dependence: Secondary | ICD-10-CM | POA: Diagnosis not present

## 2015-12-27 DIAGNOSIS — Z7982 Long term (current) use of aspirin: Secondary | ICD-10-CM | POA: Diagnosis not present

## 2015-12-27 DIAGNOSIS — E785 Hyperlipidemia, unspecified: Secondary | ICD-10-CM | POA: Diagnosis not present

## 2015-12-27 DIAGNOSIS — Z510 Encounter for antineoplastic radiation therapy: Secondary | ICD-10-CM | POA: Diagnosis not present

## 2015-12-27 DIAGNOSIS — I10 Essential (primary) hypertension: Secondary | ICD-10-CM | POA: Diagnosis not present

## 2015-12-30 ENCOUNTER — Ambulatory Visit
Admission: RE | Admit: 2015-12-30 | Discharge: 2015-12-30 | Disposition: A | Discharge status: Home / Self Care | Payer: Medicare Other | Source: Ambulatory Visit | Attending: Radiation Oncology | Admitting: Radiation Oncology

## 2015-12-30 DIAGNOSIS — E119 Type 2 diabetes mellitus without complications: Secondary | ICD-10-CM | POA: Diagnosis not present

## 2015-12-30 DIAGNOSIS — J449 Chronic obstructive pulmonary disease, unspecified: Secondary | ICD-10-CM | POA: Diagnosis not present

## 2015-12-30 DIAGNOSIS — Z7984 Long term (current) use of oral hypoglycemic drugs: Secondary | ICD-10-CM | POA: Diagnosis not present

## 2015-12-30 DIAGNOSIS — Z801 Family history of malignant neoplasm of trachea, bronchus and lung: Secondary | ICD-10-CM | POA: Diagnosis not present

## 2015-12-30 DIAGNOSIS — R0602 Shortness of breath: Secondary | ICD-10-CM | POA: Diagnosis not present

## 2015-12-30 DIAGNOSIS — Z8673 Personal history of transient ischemic attack (TIA), and cerebral infarction without residual deficits: Secondary | ICD-10-CM | POA: Diagnosis not present

## 2015-12-30 DIAGNOSIS — I2510 Atherosclerotic heart disease of native coronary artery without angina pectoris: Secondary | ICD-10-CM | POA: Diagnosis not present

## 2015-12-30 DIAGNOSIS — E785 Hyperlipidemia, unspecified: Secondary | ICD-10-CM | POA: Diagnosis not present

## 2015-12-30 DIAGNOSIS — I10 Essential (primary) hypertension: Secondary | ICD-10-CM | POA: Diagnosis not present

## 2015-12-30 DIAGNOSIS — Z79899 Other long term (current) drug therapy: Secondary | ICD-10-CM | POA: Diagnosis not present

## 2015-12-30 DIAGNOSIS — Z7982 Long term (current) use of aspirin: Secondary | ICD-10-CM | POA: Diagnosis not present

## 2015-12-30 DIAGNOSIS — C3411 Malignant neoplasm of upper lobe, right bronchus or lung: Secondary | ICD-10-CM | POA: Diagnosis not present

## 2015-12-30 DIAGNOSIS — Z87891 Personal history of nicotine dependence: Secondary | ICD-10-CM | POA: Diagnosis not present

## 2015-12-30 DIAGNOSIS — Z510 Encounter for antineoplastic radiation therapy: Secondary | ICD-10-CM | POA: Diagnosis not present

## 2015-12-30 NOTE — Progress Notes (Signed)
[place]  [phone] [phone]) [phone]  ID: [name] [name] OB: 1940-10-27  MR#: [id number]  HWE#:[id number]  Patient Care Team: Jerrol Banana., MD as PCP - General (Unknown Physician Specialty) [name], MD as Consulting Physician (Cardiology) Algernon Huxley, MD as Consulting Physician (Vascular Surgery)  CHIEF COMPLAINT: Clinical stage IIIa small cell lung carcinoma of the right upper lobe lung.  INTERVAL HISTORY: Patient returns to clinic today for further evaluation and consideration of cycle 2 of 4 of cisplatin and etoposide. She had several days of nausea and vomiting after her first treatment, but otherwise tolerated it well. She continues to have a chronic cough and shortness of breath, but otherwise feels well. She has no neurologic complaints. She denies any recent fevers or illnesses. She has a good appetite and denies weight loss. She has no chest pain. She denies any nausea, vomiting, constipation, or diarrhea. She has no urinary complaints. Patient offers no further specific complaints today.  REVIEW OF SYSTEMS:   Review of Systems  Constitutional: Negative.  Negative for fever, malaise/fatigue and weight loss.  Respiratory: Positive for cough and shortness of breath.   Cardiovascular: Negative.  Negative for chest pain.  Gastrointestinal: Negative.  Negative for abdominal pain.  Musculoskeletal: Negative.   Neurological: Negative.   Psychiatric/Behavioral: Negative.  The patient is not nervous/anxious.     As per HPI. Otherwise, a complete review of systems is negative.  PAST MEDICAL HISTORY: Past Medical History:  Diagnosis Date  . Abnormal CT lung screening 10/17/2015  . COPD (chronic obstructive pulmonary disease) ([place])   . Coronary artery disease, non-occlusive    a. cath 2006: min nonobs CAD; b. cath 12/2010: cath LAD 50%, RCA 60%; c. 08/2013: Minimal luminal irregs, right dominant system with no significant CAD,  diffuse luminal irregs noted. Normal EF 55%, no AS or MS.   . Diabetes mellitus   . Hyperlipemia    Followed by Dr. Rosanna Randy  . Hypertension   . Lung cancer ([place])   . Macular degeneration    rt  . Personal history of tobacco use, presenting hazards to health 10/15/2015  . Pneumonia    hx  . Shortness of breath   . Stroke [facility])     PAST SURGICAL HISTORY: Past Surgical History:  Procedure Laterality Date  . ABDOMINAL HYSTERECTOMY    . ANTERIOR CERVICAL DECOMP/DISCECTOMY FUSION N/A 10/19/2013   Procedure: CERVICAL FIVE-SIX ANTERIOR CERVICAL DECOMPRESSION WITH FUSION INTERBODY PROSTHESIS PLATING AND PEEK CAGE;  Surgeon: Ophelia Charter, MD;  Location: [place] NEURO ORS;  Service: Neurosurgery;  Laterality: N/A;  . BACK SURGERY  80's  . BREAST CYST EXCISION Left    left negative   . CARDIAC CATHETERIZATION  05/2004  . CATARACT EXTRACTION Left   . CHOLECYSTECTOMY    . ENDARTERECTOMY Left 10/24/2014   Procedure: ENDARTERECTOMY CAROTID;  Surgeon: Algernon Huxley, MD;  Location: [facility] ORS;  Service: Vascular;  Laterality: Left;  . ENDOBRONCHIAL ULTRASOUND N/A 11/14/2015   Procedure: ENDOBRONCHIAL ULTRASOUND;  Surgeon: [name], MD;  Location: [facility] ORS;  Service: Cardiopulmonary;  Laterality: N/A;  . PERIPHERAL VASCULAR CATHETERIZATION N/A 12/04/2015   Procedure: Glori Luis Cath Insertion;  Surgeon: Algernon Huxley, MD;  Location: [place] CV LAB;  Service: Cardiovascular;  Laterality: N/A;  . TONSILLECTOMY AND ADENOIDECTOMY    . VESICOVAGINAL FISTULA CLOSURE W/ TAH      FAMILY HISTORY: Family History  Problem Relation Age of Onset  . Stroke Mother  Massive  . Heart attack Father     Massive  . Heart attack Brother   . Heart attack Brother   . Lung cancer Maternal Grandfather   . Heart attack Paternal Grandmother     MI       ADVANCED DIRECTIVES (Y/N):  N   HEALTH MAINTENANCE: Social History  Substance Use Topics  . Smoking status: Former Smoker    Packs/day: 1.00    Years:  50.00    Types: Cigarettes    Quit date: 10/03/2015  . Smokeless tobacco: Never Used     Comment: smokes 3 cigs daily 05/06/15. Pt instructed to quit.  . Alcohol use No     Colonoscopy:  PAP:  Bone density:  Lipid panel:  Allergies  Allergen Reactions  . Coconut Fatty Acids Swelling    Throat swells    Current Outpatient Prescriptions  Medication Sig Dispense Refill  . albuterol (PROVENTIL) (5 MG/ML) 0.5% nebulizer solution Take 2.5 mg by nebulization 2 (two) times daily.    Marland Kitchen aspirin 81 MG tablet Take 81 mg by mouth every morning.     Marland Kitchen atorvastatin (LIPITOR) 20 MG tablet Take 1 tablet by mouth at  bedtime 90 tablet 3  . budesonide-formoterol (SYMBICORT) 160-4.5 MCG/ACT inhaler Inhale 2 puffs into the lungs 2 (two) times daily. 1 Inhaler 0  . clopidogrel (PLAVIX) 75 MG tablet     . diazepam (VALIUM) 5 MG tablet Take 1 tablet (5 mg total) by mouth every 6 (six) hours as needed for muscle spasms. 50 tablet 1  . gabapentin (NEURONTIN) 600 MG tablet Take 1 tablet by mouth two  times daily 180 tablet 3  . glimepiride (AMARYL) 4 MG tablet Take 1 tablet by mouth two  times daily 180 tablet 3  . isosorbide mononitrate (IMDUR) 30 MG 24 hr tablet Take 1 tablet by mouth  daily 90 tablet 3  . lidocaine-prilocaine (EMLA) cream Apply to port 1-2 hours prior to chemotherapy appointment. Cover with plastic wrap. 90 g 3  . losartan-hydrochlorothiazide (HYZAAR) 100-25 MG tablet Take 1 tablet by mouth  daily 90 tablet 3  . magnesium oxide (MAG-OX) 400 (241.3 MG) MG tablet Take 1 tablet by mouth 2 (two) times daily.    . metFORMIN (GLUCOPHAGE) 1000 MG tablet Take 1 tablet by mouth  every day 90 tablet 3  . Multiple Vitamins-Minerals (ICAPS) CAPS Take 2 capsules by mouth daily. Reported on 04/17/2015    . naproxen sodium (ANAPROX) 220 MG tablet Take 220 mg by mouth as needed. Takes twice a week as needed.    . nitroGLYCERIN (NITROSTAT) 0.4 MG SL tablet Place 1 tablet (0.4 mg total) under the tongue  every 5 (five) minutes as needed for chest pain. 25 tablet 3  . ondansetron (ZOFRAN) 8 MG tablet Take 1 tablet (8 mg total) by mouth 2 (two) times daily as needed. 30 tablet 1  . oxymetazoline (NO DRIP NASAL SPRAY) 0.05 % nasal spray Place 1 spray into both nostrils 2 (two) times daily.    . pantoprazole (PROTONIX) 40 MG tablet Take 1 tablet by mouth  daily 90 tablet 3  . pioglitazone (ACTOS) 30 MG tablet Take 1 tablet (30 mg total) by mouth daily. 90 tablet 3  . potassium chloride (K-DUR) 10 MEQ tablet Take 3 tablets by mouth  daily as directed 270 tablet 1  . prochlorperazine (COMPAZINE) 10 MG tablet Take 1 tablet (10 mg total) by mouth every 6 (six) hours as needed (Nausea or vomiting). 30 tablet  1   No current facility-administered medications for this visit.     OBJECTIVE: Vitals:   12/31/15 0918  BP: 117/65  Pulse: 91  Resp: 18  Temp: (!) 95 F (35 C)     Body mass index is 28.55 kg/m.    ECOG FS:0 - Asymptomatic  General: Well-developed, well-nourished, no acute distress. Eyes: Pink conjunctiva, anicteric sclera. Lungs: Clear to auscultation bilaterally. Heart: Regular rate and rhythm. No rubs, murmurs, or gallops. Abdomen: Soft, nontender, nondistended. No organomegaly noted, normoactive bowel sounds. Musculoskeletal: No edema, cyanosis, or clubbing. Neuro: Alert, answering all questions appropriately. Cranial nerves grossly intact. Skin: No rashes or petechiae noted. Psych: Normal affect.   LAB RESULTS:  Lab Results  Component Value Date   NA 134 (L) 12/31/2015   K 4.6 12/31/2015   CL 96 (L) 12/31/2015   CO2 28 12/31/2015   GLUCOSE 367 (H) 12/31/2015   BUN 18 12/31/2015   CREATININE 1.03 (H) 12/31/2015   CALCIUM 9.0 12/31/2015   PROT 7.0 12/31/2015   ALBUMIN 3.6 12/31/2015   AST 17 12/31/2015   ALT 16 12/31/2015   ALKPHOS 98 12/31/2015   BILITOT 0.4 12/31/2015   GFRNONAA 53 (L) 12/31/2015   GFRAA >60 12/31/2015    Lab Results  Component Value Date    WBC 7.4 12/31/2015   NEUTROABS 5.4 12/31/2015   HGB 10.0 (L) 12/31/2015   HCT 28.6 (L) 12/31/2015   MCV 90.0 12/31/2015   PLT 532 (H) 12/31/2015     STUDIES: No results found.  ASSESSMENT: Clinical stage IIIa small cell lung carcinoma of the right upper lobe lung.  PLAN:    1. Clinical stage IIIa small cell lung carcinoma of the right upper lobe lung: CT and PET scan results reviewed independently concerning for stage IIIa disease given patient's 11 mm right paratracheal lymph node positive for small cell lung cancer. Patient has no other evidence of metastatic disease. MRI the brain is negative. Continue daily XRT completing on January 24, 2016. Proceed with cycle 2 of 4 of cisplatin on day 1 and etoposide on days 1 and 2 and 3 today. Will add in Neulasta support if necessary at the conclusion of her XRT. Plan to reimage with PET scan after the conclusion of cycle 4. Return to clinic in 1 and 2 days for etoposide only and then in 3 weeks for consideration of cycle 3. 2. Cough: Continue OTC treatments as needed. 3. Hyperglycemia: Monitor closely during chemotherapy since patient will be getting dexamethasone as a premedication. 4. Nausea and vomiting: Continue Zofran and Compazine as needed. 5. Anemia: Mild, monitor.   Patient expressed understanding and was in agreement with this plan. She also understands that She can call clinic at any time with any questions, concerns, or complaints.   Primary cancer of right upper lobe of lung [facility])   Staging form: Lung, AJCC 7th Edition   - Clinical stage from 11/26/2015: Stage IIIA (T1a, N2, M0) - Signed by Lloyd Huger, MD on 11/26/2015  Lloyd Huger, MD   01/02/2016 8:50 AM

## 2015-12-31 ENCOUNTER — Inpatient Hospital Stay: Payer: Medicare Other

## 2015-12-31 ENCOUNTER — Ambulatory Visit
Admission: RE | Admit: 2015-12-31 | Discharge: 2015-12-31 | Disposition: A | Discharge status: Home / Self Care | Payer: Medicare Other | Source: Ambulatory Visit | Attending: Radiation Oncology | Admitting: Radiation Oncology

## 2015-12-31 ENCOUNTER — Inpatient Hospital Stay (HOSPITAL_BASED_OUTPATIENT_CLINIC_OR_DEPARTMENT_OTHER): Payer: Medicare Other | Admitting: Oncology

## 2015-12-31 VITALS — BP 117/65 | HR 91 | Temp 95.0°F | Resp 18 | Wt 166.3 lb

## 2015-12-31 DIAGNOSIS — C3411 Malignant neoplasm of upper lobe, right bronchus or lung: Secondary | ICD-10-CM | POA: Diagnosis not present

## 2015-12-31 DIAGNOSIS — R112 Nausea with vomiting, unspecified: Secondary | ICD-10-CM | POA: Diagnosis not present

## 2015-12-31 DIAGNOSIS — D649 Anemia, unspecified: Secondary | ICD-10-CM

## 2015-12-31 DIAGNOSIS — R0602 Shortness of breath: Secondary | ICD-10-CM | POA: Diagnosis not present

## 2015-12-31 DIAGNOSIS — E119 Type 2 diabetes mellitus without complications: Secondary | ICD-10-CM | POA: Diagnosis not present

## 2015-12-31 DIAGNOSIS — Z801 Family history of malignant neoplasm of trachea, bronchus and lung: Secondary | ICD-10-CM | POA: Diagnosis not present

## 2015-12-31 DIAGNOSIS — Z7982 Long term (current) use of aspirin: Secondary | ICD-10-CM | POA: Diagnosis not present

## 2015-12-31 DIAGNOSIS — Z87891 Personal history of nicotine dependence: Secondary | ICD-10-CM

## 2015-12-31 DIAGNOSIS — Z510 Encounter for antineoplastic radiation therapy: Secondary | ICD-10-CM | POA: Diagnosis not present

## 2015-12-31 DIAGNOSIS — E1165 Type 2 diabetes mellitus with hyperglycemia: Secondary | ICD-10-CM | POA: Diagnosis not present

## 2015-12-31 DIAGNOSIS — J449 Chronic obstructive pulmonary disease, unspecified: Secondary | ICD-10-CM | POA: Diagnosis not present

## 2015-12-31 DIAGNOSIS — I10 Essential (primary) hypertension: Secondary | ICD-10-CM | POA: Diagnosis not present

## 2015-12-31 DIAGNOSIS — I2510 Atherosclerotic heart disease of native coronary artery without angina pectoris: Secondary | ICD-10-CM | POA: Diagnosis not present

## 2015-12-31 DIAGNOSIS — Z7984 Long term (current) use of oral hypoglycemic drugs: Secondary | ICD-10-CM | POA: Diagnosis not present

## 2015-12-31 DIAGNOSIS — Z79899 Other long term (current) drug therapy: Secondary | ICD-10-CM | POA: Diagnosis not present

## 2015-12-31 DIAGNOSIS — R05 Cough: Secondary | ICD-10-CM | POA: Diagnosis not present

## 2015-12-31 DIAGNOSIS — E785 Hyperlipidemia, unspecified: Secondary | ICD-10-CM | POA: Diagnosis not present

## 2015-12-31 DIAGNOSIS — Z8701 Personal history of pneumonia (recurrent): Secondary | ICD-10-CM

## 2015-12-31 DIAGNOSIS — Z8673 Personal history of transient ischemic attack (TIA), and cerebral infarction without residual deficits: Secondary | ICD-10-CM

## 2015-12-31 DIAGNOSIS — Z5111 Encounter for antineoplastic chemotherapy: Secondary | ICD-10-CM | POA: Diagnosis not present

## 2015-12-31 LAB — CBC WITH DIFFERENTIAL/PLATELET
Basophils Absolute: 0 10*3/uL (ref 0–0.1)
Basophils Relative: 0 %
EOS ABS: 0 10*3/uL (ref 0–0.7)
Eosinophils Relative: 0 %
HCT: 28.6 % — ABNORMAL LOW (ref 35.0–47.0)
HEMOGLOBIN: 10 g/dL — AB (ref 12.0–16.0)
LYMPHS ABS: 1 10*3/uL (ref 1.0–3.6)
LYMPHS PCT: 13 %
MCH: 31.7 pg (ref 26.0–34.0)
MCHC: 35.2 g/dL (ref 32.0–36.0)
MCV: 90 fL (ref 80.0–100.0)
Monocytes Absolute: 1 10*3/uL — ABNORMAL HIGH (ref 0.2–0.9)
Monocytes Relative: 13 %
NEUTROS PCT: 74 %
Neutro Abs: 5.4 10*3/uL (ref 1.4–6.5)
Platelets: 532 10*3/uL — ABNORMAL HIGH (ref 150–440)
RBC: 3.17 MIL/uL — AB (ref 3.80–5.20)
RDW: 14.1 % (ref 11.5–14.5)
WBC: 7.4 10*3/uL (ref 3.6–11.0)

## 2015-12-31 LAB — COMPREHENSIVE METABOLIC PANEL
ALBUMIN: 3.6 g/dL (ref 3.5–5.0)
ALK PHOS: 98 U/L (ref 38–126)
ALT: 16 U/L (ref 14–54)
AST: 17 U/L (ref 15–41)
Anion gap: 10 (ref 5–15)
BUN: 18 mg/dL (ref 6–20)
CALCIUM: 9 mg/dL (ref 8.9–10.3)
CO2: 28 mmol/L (ref 22–32)
CREATININE: 1.03 mg/dL — AB (ref 0.44–1.00)
Chloride: 96 mmol/L — ABNORMAL LOW (ref 101–111)
GFR calc non Af Amer: 53 mL/min — ABNORMAL LOW (ref >60)
GLUCOSE: 367 mg/dL — AB (ref 65–99)
Potassium: 4.6 mmol/L (ref 3.5–5.1)
SODIUM: 134 mmol/L — AB (ref 135–145)
Total Bilirubin: 0.4 mg/dL (ref 0.3–1.2)
Total Protein: 7 g/dL (ref 6.5–8.1)

## 2015-12-31 MED ORDER — SODIUM CHLORIDE 0.9 % IV SOLN
Freq: Once | INTRAVENOUS | Status: AC
  Administered 2015-12-31: 10:00:00 via INTRAVENOUS
  Filled 2015-12-31: qty 1000

## 2015-12-31 MED ORDER — SODIUM CHLORIDE 0.9 % IV SOLN
Freq: Once | INTRAVENOUS | Status: AC
  Administered 2015-12-31: 13:00:00 via INTRAVENOUS
  Filled 2015-12-31: qty 5

## 2015-12-31 MED ORDER — SODIUM CHLORIDE 0.9% FLUSH
10.0000 mL | Freq: Once | INTRAVENOUS | Status: DC
  Filled 2015-12-31: qty 10

## 2015-12-31 MED ORDER — PALONOSETRON HCL INJECTION 0.25 MG/5ML
0.2500 mg | Freq: Once | INTRAVENOUS | Status: AC
  Administered 2015-12-31: 0.25 mg via INTRAVENOUS
  Filled 2015-12-31: qty 5

## 2015-12-31 MED ORDER — SODIUM CHLORIDE 0.9% FLUSH
10.0000 mL | Freq: Once | INTRAVENOUS | Status: AC
  Administered 2015-12-31: 10 mL via INTRAVENOUS
  Filled 2015-12-31: qty 10

## 2015-12-31 MED ORDER — HEPARIN SOD (PORK) LOCK FLUSH 100 UNIT/ML IV SOLN
500.0000 [IU] | Freq: Once | INTRAVENOUS | Status: AC
  Administered 2015-12-31: 500 [IU] via INTRAVENOUS
  Filled 2015-12-31: qty 5

## 2015-12-31 MED ORDER — SODIUM CHLORIDE 0.9 % IV SOLN
80.0000 mg/m2 | Freq: Once | INTRAVENOUS | Status: AC
  Administered 2015-12-31: 150 mg via INTRAVENOUS
  Filled 2015-12-31: qty 7.5

## 2015-12-31 MED ORDER — POTASSIUM CHLORIDE 2 MEQ/ML IV SOLN
Freq: Once | INTRAVENOUS | Status: AC
  Administered 2015-12-31: 11:00:00 via INTRAVENOUS
  Filled 2015-12-31: qty 1000

## 2015-12-31 MED ORDER — SODIUM CHLORIDE 0.9 % IV SOLN
80.0000 mg/m2 | Freq: Once | INTRAVENOUS | Status: AC
  Administered 2015-12-31: 148 mg via INTRAVENOUS
  Filled 2015-12-31: qty 148

## 2015-12-31 NOTE — Progress Notes (Signed)
States had a couple days of nausea/vomiting after last treatment. Hair is starting to fall out which is upsetting to the patient. Feeling well today.

## 2016-01-01 ENCOUNTER — Inpatient Hospital Stay: Payer: Medicare Other

## 2016-01-01 ENCOUNTER — Ambulatory Visit: Payer: Self-pay

## 2016-01-01 ENCOUNTER — Ambulatory Visit
Admission: RE | Admit: 2016-01-01 | Discharge: 2016-01-01 | Disposition: A | Discharge status: Home / Self Care | Payer: Medicare Other | Source: Ambulatory Visit | Attending: Radiation Oncology | Admitting: Radiation Oncology

## 2016-01-01 VITALS — BP 116/66 | HR 80 | Temp 97.0°F | Resp 18

## 2016-01-01 DIAGNOSIS — E785 Hyperlipidemia, unspecified: Secondary | ICD-10-CM | POA: Diagnosis not present

## 2016-01-01 DIAGNOSIS — C3411 Malignant neoplasm of upper lobe, right bronchus or lung: Secondary | ICD-10-CM | POA: Diagnosis not present

## 2016-01-01 DIAGNOSIS — Z87891 Personal history of nicotine dependence: Secondary | ICD-10-CM | POA: Diagnosis not present

## 2016-01-01 DIAGNOSIS — D649 Anemia, unspecified: Secondary | ICD-10-CM | POA: Diagnosis not present

## 2016-01-01 DIAGNOSIS — Z7982 Long term (current) use of aspirin: Secondary | ICD-10-CM | POA: Diagnosis not present

## 2016-01-01 DIAGNOSIS — Z79899 Other long term (current) drug therapy: Secondary | ICD-10-CM | POA: Diagnosis not present

## 2016-01-01 DIAGNOSIS — I2510 Atherosclerotic heart disease of native coronary artery without angina pectoris: Secondary | ICD-10-CM | POA: Diagnosis not present

## 2016-01-01 DIAGNOSIS — Z510 Encounter for antineoplastic radiation therapy: Secondary | ICD-10-CM | POA: Diagnosis not present

## 2016-01-01 DIAGNOSIS — Z8673 Personal history of transient ischemic attack (TIA), and cerebral infarction without residual deficits: Secondary | ICD-10-CM | POA: Diagnosis not present

## 2016-01-01 DIAGNOSIS — Z5111 Encounter for antineoplastic chemotherapy: Secondary | ICD-10-CM | POA: Diagnosis not present

## 2016-01-01 DIAGNOSIS — R112 Nausea with vomiting, unspecified: Secondary | ICD-10-CM | POA: Diagnosis not present

## 2016-01-01 DIAGNOSIS — E1165 Type 2 diabetes mellitus with hyperglycemia: Secondary | ICD-10-CM | POA: Diagnosis not present

## 2016-01-01 DIAGNOSIS — I10 Essential (primary) hypertension: Secondary | ICD-10-CM | POA: Diagnosis not present

## 2016-01-01 DIAGNOSIS — E119 Type 2 diabetes mellitus without complications: Secondary | ICD-10-CM | POA: Diagnosis not present

## 2016-01-01 DIAGNOSIS — Z7984 Long term (current) use of oral hypoglycemic drugs: Secondary | ICD-10-CM | POA: Diagnosis not present

## 2016-01-01 DIAGNOSIS — R0602 Shortness of breath: Secondary | ICD-10-CM | POA: Diagnosis not present

## 2016-01-01 DIAGNOSIS — R05 Cough: Secondary | ICD-10-CM | POA: Diagnosis not present

## 2016-01-01 DIAGNOSIS — J449 Chronic obstructive pulmonary disease, unspecified: Secondary | ICD-10-CM | POA: Diagnosis not present

## 2016-01-01 DIAGNOSIS — Z801 Family history of malignant neoplasm of trachea, bronchus and lung: Secondary | ICD-10-CM | POA: Diagnosis not present

## 2016-01-01 MED ORDER — HEPARIN SOD (PORK) LOCK FLUSH 100 UNIT/ML IV SOLN
500.0000 [IU] | Freq: Once | INTRAVENOUS | Status: AC
  Administered 2016-01-01: 500 [IU] via INTRAVENOUS

## 2016-01-01 MED ORDER — SODIUM CHLORIDE 0.9 % IV SOLN
Freq: Once | INTRAVENOUS | Status: AC
  Administered 2016-01-01: 14:00:00 via INTRAVENOUS
  Filled 2016-01-01: qty 1000

## 2016-01-01 MED ORDER — HEPARIN SOD (PORK) LOCK FLUSH 100 UNIT/ML IV SOLN
INTRAVENOUS | Status: AC
  Filled 2016-01-01: qty 5

## 2016-01-01 MED ORDER — SODIUM CHLORIDE 0.9 % IV SOLN
80.0000 mg/m2 | Freq: Once | INTRAVENOUS | Status: AC
  Administered 2016-01-01: 150 mg via INTRAVENOUS
  Filled 2016-01-01: qty 7.5

## 2016-01-01 MED ORDER — SODIUM CHLORIDE 0.9 % IV SOLN
10.0000 mg | Freq: Once | INTRAVENOUS | Status: AC
  Administered 2016-01-01: 10 mg via INTRAVENOUS
  Filled 2016-01-01: qty 1

## 2016-01-02 ENCOUNTER — Ambulatory Visit
Admission: RE | Admit: 2016-01-02 | Discharge: 2016-01-02 | Disposition: A | Discharge status: Home / Self Care | Payer: Medicare Other | Source: Ambulatory Visit | Attending: Radiation Oncology | Admitting: Radiation Oncology

## 2016-01-02 ENCOUNTER — Inpatient Hospital Stay: Payer: Medicare Other

## 2016-01-02 VITALS — BP 120/68 | HR 78 | Temp 97.5°F | Resp 18

## 2016-01-02 DIAGNOSIS — E119 Type 2 diabetes mellitus without complications: Secondary | ICD-10-CM | POA: Diagnosis not present

## 2016-01-02 DIAGNOSIS — E1165 Type 2 diabetes mellitus with hyperglycemia: Secondary | ICD-10-CM | POA: Diagnosis not present

## 2016-01-02 DIAGNOSIS — Z7982 Long term (current) use of aspirin: Secondary | ICD-10-CM | POA: Diagnosis not present

## 2016-01-02 DIAGNOSIS — R0602 Shortness of breath: Secondary | ICD-10-CM | POA: Diagnosis not present

## 2016-01-02 DIAGNOSIS — Z87891 Personal history of nicotine dependence: Secondary | ICD-10-CM | POA: Diagnosis not present

## 2016-01-02 DIAGNOSIS — J449 Chronic obstructive pulmonary disease, unspecified: Secondary | ICD-10-CM | POA: Diagnosis not present

## 2016-01-02 DIAGNOSIS — C3411 Malignant neoplasm of upper lobe, right bronchus or lung: Secondary | ICD-10-CM | POA: Diagnosis not present

## 2016-01-02 DIAGNOSIS — Z510 Encounter for antineoplastic radiation therapy: Secondary | ICD-10-CM | POA: Diagnosis not present

## 2016-01-02 DIAGNOSIS — R05 Cough: Secondary | ICD-10-CM | POA: Diagnosis not present

## 2016-01-02 DIAGNOSIS — Z801 Family history of malignant neoplasm of trachea, bronchus and lung: Secondary | ICD-10-CM | POA: Diagnosis not present

## 2016-01-02 DIAGNOSIS — D649 Anemia, unspecified: Secondary | ICD-10-CM | POA: Diagnosis not present

## 2016-01-02 DIAGNOSIS — Z8673 Personal history of transient ischemic attack (TIA), and cerebral infarction without residual deficits: Secondary | ICD-10-CM | POA: Diagnosis not present

## 2016-01-02 DIAGNOSIS — I2510 Atherosclerotic heart disease of native coronary artery without angina pectoris: Secondary | ICD-10-CM | POA: Diagnosis not present

## 2016-01-02 DIAGNOSIS — Z79899 Other long term (current) drug therapy: Secondary | ICD-10-CM | POA: Diagnosis not present

## 2016-01-02 DIAGNOSIS — H353122 Nonexudative age-related macular degeneration, left eye, intermediate dry stage: Secondary | ICD-10-CM | POA: Diagnosis not present

## 2016-01-02 DIAGNOSIS — Z5111 Encounter for antineoplastic chemotherapy: Secondary | ICD-10-CM | POA: Diagnosis not present

## 2016-01-02 DIAGNOSIS — I10 Essential (primary) hypertension: Secondary | ICD-10-CM | POA: Diagnosis not present

## 2016-01-02 DIAGNOSIS — Z7984 Long term (current) use of oral hypoglycemic drugs: Secondary | ICD-10-CM | POA: Diagnosis not present

## 2016-01-02 DIAGNOSIS — R112 Nausea with vomiting, unspecified: Secondary | ICD-10-CM | POA: Diagnosis not present

## 2016-01-02 DIAGNOSIS — E785 Hyperlipidemia, unspecified: Secondary | ICD-10-CM | POA: Diagnosis not present

## 2016-01-02 MED ORDER — SODIUM CHLORIDE 0.9 % IV SOLN
Freq: Once | INTRAVENOUS | Status: AC
  Administered 2016-01-02: 14:00:00 via INTRAVENOUS
  Filled 2016-01-02: qty 1000

## 2016-01-02 MED ORDER — SODIUM CHLORIDE 0.9 % IV SOLN
10.0000 mg | Freq: Once | INTRAVENOUS | Status: AC
  Administered 2016-01-02: 10 mg via INTRAVENOUS
  Filled 2016-01-02: qty 1

## 2016-01-02 MED ORDER — SODIUM CHLORIDE 0.9 % IJ SOLN
10.0000 mL | Freq: Once | INTRAMUSCULAR | Status: AC
  Administered 2016-01-02: 10 mL via INTRAVENOUS
  Filled 2016-01-02: qty 10

## 2016-01-02 MED ORDER — SODIUM CHLORIDE 0.9 % IV SOLN
80.0000 mg/m2 | Freq: Once | INTRAVENOUS | Status: AC
  Administered 2016-01-02: 150 mg via INTRAVENOUS
  Filled 2016-01-02: qty 7.5

## 2016-01-02 MED ORDER — HEPARIN SOD (PORK) LOCK FLUSH 100 UNIT/ML IV SOLN
500.0000 [IU] | Freq: Once | INTRAVENOUS | Status: AC
  Administered 2016-01-02: 500 [IU] via INTRAVENOUS
  Filled 2016-01-02: qty 5

## 2016-01-03 ENCOUNTER — Ambulatory Visit
Admission: RE | Admit: 2016-01-03 | Discharge: 2016-01-03 | Disposition: A | Discharge status: Home / Self Care | Payer: Medicare Other | Source: Ambulatory Visit | Attending: Radiation Oncology | Admitting: Radiation Oncology

## 2016-01-03 ENCOUNTER — Encounter: Payer: Self-pay | Admitting: Family Medicine

## 2016-01-03 DIAGNOSIS — R0602 Shortness of breath: Secondary | ICD-10-CM | POA: Diagnosis not present

## 2016-01-03 DIAGNOSIS — J449 Chronic obstructive pulmonary disease, unspecified: Secondary | ICD-10-CM | POA: Diagnosis not present

## 2016-01-03 DIAGNOSIS — I2510 Atherosclerotic heart disease of native coronary artery without angina pectoris: Secondary | ICD-10-CM | POA: Diagnosis not present

## 2016-01-03 DIAGNOSIS — I10 Essential (primary) hypertension: Secondary | ICD-10-CM | POA: Diagnosis not present

## 2016-01-03 DIAGNOSIS — E119 Type 2 diabetes mellitus without complications: Secondary | ICD-10-CM | POA: Diagnosis not present

## 2016-01-03 DIAGNOSIS — Z510 Encounter for antineoplastic radiation therapy: Secondary | ICD-10-CM | POA: Diagnosis not present

## 2016-01-03 DIAGNOSIS — Z87891 Personal history of nicotine dependence: Secondary | ICD-10-CM | POA: Diagnosis not present

## 2016-01-03 DIAGNOSIS — Z7982 Long term (current) use of aspirin: Secondary | ICD-10-CM | POA: Diagnosis not present

## 2016-01-03 DIAGNOSIS — Z801 Family history of malignant neoplasm of trachea, bronchus and lung: Secondary | ICD-10-CM | POA: Diagnosis not present

## 2016-01-03 DIAGNOSIS — E785 Hyperlipidemia, unspecified: Secondary | ICD-10-CM | POA: Diagnosis not present

## 2016-01-03 DIAGNOSIS — Z79899 Other long term (current) drug therapy: Secondary | ICD-10-CM | POA: Diagnosis not present

## 2016-01-03 DIAGNOSIS — C3411 Malignant neoplasm of upper lobe, right bronchus or lung: Secondary | ICD-10-CM | POA: Diagnosis not present

## 2016-01-03 DIAGNOSIS — Z8673 Personal history of transient ischemic attack (TIA), and cerebral infarction without residual deficits: Secondary | ICD-10-CM | POA: Diagnosis not present

## 2016-01-03 DIAGNOSIS — Z7984 Long term (current) use of oral hypoglycemic drugs: Secondary | ICD-10-CM | POA: Diagnosis not present

## 2016-01-06 ENCOUNTER — Other Ambulatory Visit: Payer: Self-pay | Admitting: Oncology

## 2016-01-06 ENCOUNTER — Ambulatory Visit: Payer: Medicare Other

## 2016-01-06 ENCOUNTER — Inpatient Hospital Stay: Payer: Medicare Other | Attending: Family Medicine

## 2016-01-06 ENCOUNTER — Inpatient Hospital Stay: Payer: Medicare Other

## 2016-01-06 DIAGNOSIS — Z8673 Personal history of transient ischemic attack (TIA), and cerebral infarction without residual deficits: Secondary | ICD-10-CM | POA: Insufficient documentation

## 2016-01-06 DIAGNOSIS — R112 Nausea with vomiting, unspecified: Secondary | ICD-10-CM | POA: Insufficient documentation

## 2016-01-06 DIAGNOSIS — R0602 Shortness of breath: Secondary | ICD-10-CM | POA: Insufficient documentation

## 2016-01-06 DIAGNOSIS — Z79899 Other long term (current) drug therapy: Secondary | ICD-10-CM | POA: Diagnosis not present

## 2016-01-06 DIAGNOSIS — Z7984 Long term (current) use of oral hypoglycemic drugs: Secondary | ICD-10-CM | POA: Diagnosis not present

## 2016-01-06 DIAGNOSIS — R05 Cough: Secondary | ICD-10-CM | POA: Diagnosis not present

## 2016-01-06 DIAGNOSIS — Z5111 Encounter for antineoplastic chemotherapy: Secondary | ICD-10-CM | POA: Insufficient documentation

## 2016-01-06 DIAGNOSIS — I10 Essential (primary) hypertension: Secondary | ICD-10-CM | POA: Insufficient documentation

## 2016-01-06 DIAGNOSIS — Z7982 Long term (current) use of aspirin: Secondary | ICD-10-CM | POA: Diagnosis not present

## 2016-01-06 DIAGNOSIS — I2510 Atherosclerotic heart disease of native coronary artery without angina pectoris: Secondary | ICD-10-CM | POA: Insufficient documentation

## 2016-01-06 DIAGNOSIS — D649 Anemia, unspecified: Secondary | ICD-10-CM | POA: Insufficient documentation

## 2016-01-06 DIAGNOSIS — C3411 Malignant neoplasm of upper lobe, right bronchus or lung: Secondary | ICD-10-CM | POA: Diagnosis not present

## 2016-01-06 DIAGNOSIS — E1165 Type 2 diabetes mellitus with hyperglycemia: Secondary | ICD-10-CM | POA: Diagnosis not present

## 2016-01-06 DIAGNOSIS — J449 Chronic obstructive pulmonary disease, unspecified: Secondary | ICD-10-CM | POA: Diagnosis not present

## 2016-01-06 DIAGNOSIS — R630 Anorexia: Secondary | ICD-10-CM | POA: Diagnosis not present

## 2016-01-06 DIAGNOSIS — Z87891 Personal history of nicotine dependence: Secondary | ICD-10-CM | POA: Insufficient documentation

## 2016-01-06 DIAGNOSIS — E785 Hyperlipidemia, unspecified: Secondary | ICD-10-CM | POA: Insufficient documentation

## 2016-01-06 LAB — COMPREHENSIVE METABOLIC PANEL
ALBUMIN: 3.8 g/dL (ref 3.5–5.0)
ALT: 22 U/L (ref 14–54)
AST: 14 U/L — AB (ref 15–41)
Alkaline Phosphatase: 90 U/L (ref 38–126)
Anion gap: 10 (ref 5–15)
BUN: 45 mg/dL — AB (ref 6–20)
CHLORIDE: 96 mmol/L — AB (ref 101–111)
CO2: 26 mmol/L (ref 22–32)
Calcium: 8.9 mg/dL (ref 8.9–10.3)
Creatinine, Ser: 1.2 mg/dL — ABNORMAL HIGH (ref 0.44–1.00)
GFR, EST AFRICAN AMERICAN: 51 mL/min — AB (ref >60)
GFR, EST NON AFRICAN AMERICAN: 44 mL/min — AB (ref >60)
Glucose, Bld: 381 mg/dL — ABNORMAL HIGH (ref 65–99)
POTASSIUM: 4.5 mmol/L (ref 3.5–5.1)
Sodium: 132 mmol/L — ABNORMAL LOW (ref 135–145)
Total Bilirubin: 0.9 mg/dL (ref 0.3–1.2)
Total Protein: 7.1 g/dL (ref 6.5–8.1)

## 2016-01-06 LAB — CBC WITH DIFFERENTIAL/PLATELET
BASOS ABS: 0 10*3/uL (ref 0–0.1)
BASOS PCT: 0 %
Eosinophils Absolute: 0 10*3/uL (ref 0–0.7)
Eosinophils Relative: 0 %
HEMATOCRIT: 30 % — AB (ref 35.0–47.0)
HEMOGLOBIN: 10.5 g/dL — AB (ref 12.0–16.0)
LYMPHS PCT: 4 %
Lymphs Abs: 0.3 10*3/uL — ABNORMAL LOW (ref 1.0–3.6)
MCH: 31.1 pg (ref 26.0–34.0)
MCHC: 35 g/dL (ref 32.0–36.0)
MCV: 88.9 fL (ref 80.0–100.0)
Monocytes Absolute: 0.1 10*3/uL — ABNORMAL LOW (ref 0.2–0.9)
Monocytes Relative: 1 %
NEUTROS ABS: 8.3 10*3/uL — AB (ref 1.4–6.5)
NEUTROS PCT: 95 %
Platelets: 288 10*3/uL (ref 150–440)
RBC: 3.37 MIL/uL — AB (ref 3.80–5.20)
RDW: 14 % (ref 11.5–14.5)
WBC: 8.7 10*3/uL (ref 3.6–11.0)

## 2016-01-06 MED ORDER — SODIUM CHLORIDE 0.9 % IV SOLN
Freq: Once | INTRAVENOUS | Status: AC
  Administered 2016-01-06: 16:00:00 via INTRAVENOUS
  Filled 2016-01-06: qty 4

## 2016-01-06 MED ORDER — HEPARIN SOD (PORK) LOCK FLUSH 100 UNIT/ML IV SOLN
INTRAVENOUS | Status: AC
  Filled 2016-01-06: qty 5

## 2016-01-06 MED ORDER — SODIUM CHLORIDE 0.9% FLUSH
10.0000 mL | Freq: Once | INTRAVENOUS | Status: DC
  Filled 2016-01-06: qty 10

## 2016-01-06 MED ORDER — SODIUM CHLORIDE 0.9 % IV SOLN
Freq: Once | INTRAVENOUS | Status: AC
  Administered 2016-01-06: 15:00:00 via INTRAVENOUS
  Filled 2016-01-06: qty 1000

## 2016-01-06 MED ORDER — HEPARIN SOD (PORK) LOCK FLUSH 100 UNIT/ML IV SOLN: 500.0000 [IU] | Freq: Once | INTRAVENOUS | Status: DC

## 2016-01-06 MED ORDER — SODIUM CHLORIDE 0.9 % IV SOLN
Freq: Once | INTRAVENOUS | Status: DC
Start: 2016-01-06 — End: 2020-12-06
  Filled 2016-01-06: qty 4

## 2016-01-07 ENCOUNTER — Ambulatory Visit
Admission: RE | Admit: 2016-01-07 | Discharge: 2016-01-07 | Disposition: A | Discharge status: Home / Self Care | Payer: Medicare Other | Source: Ambulatory Visit | Attending: Radiation Oncology | Admitting: Radiation Oncology

## 2016-01-07 DIAGNOSIS — Z79899 Other long term (current) drug therapy: Secondary | ICD-10-CM | POA: Diagnosis not present

## 2016-01-07 DIAGNOSIS — E785 Hyperlipidemia, unspecified: Secondary | ICD-10-CM | POA: Diagnosis not present

## 2016-01-07 DIAGNOSIS — I10 Essential (primary) hypertension: Secondary | ICD-10-CM | POA: Diagnosis not present

## 2016-01-07 DIAGNOSIS — J449 Chronic obstructive pulmonary disease, unspecified: Secondary | ICD-10-CM | POA: Diagnosis not present

## 2016-01-07 DIAGNOSIS — Z801 Family history of malignant neoplasm of trachea, bronchus and lung: Secondary | ICD-10-CM | POA: Diagnosis not present

## 2016-01-07 DIAGNOSIS — Z8673 Personal history of transient ischemic attack (TIA), and cerebral infarction without residual deficits: Secondary | ICD-10-CM | POA: Diagnosis not present

## 2016-01-07 DIAGNOSIS — C3411 Malignant neoplasm of upper lobe, right bronchus or lung: Secondary | ICD-10-CM | POA: Diagnosis not present

## 2016-01-07 DIAGNOSIS — Z7982 Long term (current) use of aspirin: Secondary | ICD-10-CM | POA: Diagnosis not present

## 2016-01-07 DIAGNOSIS — E119 Type 2 diabetes mellitus without complications: Secondary | ICD-10-CM | POA: Diagnosis not present

## 2016-01-07 DIAGNOSIS — I2510 Atherosclerotic heart disease of native coronary artery without angina pectoris: Secondary | ICD-10-CM | POA: Diagnosis not present

## 2016-01-07 DIAGNOSIS — Z7984 Long term (current) use of oral hypoglycemic drugs: Secondary | ICD-10-CM | POA: Diagnosis not present

## 2016-01-07 DIAGNOSIS — R0602 Shortness of breath: Secondary | ICD-10-CM | POA: Diagnosis not present

## 2016-01-07 DIAGNOSIS — Z87891 Personal history of nicotine dependence: Secondary | ICD-10-CM | POA: Diagnosis not present

## 2016-01-07 DIAGNOSIS — Z510 Encounter for antineoplastic radiation therapy: Secondary | ICD-10-CM | POA: Diagnosis not present

## 2016-01-08 ENCOUNTER — Ambulatory Visit
Admission: RE | Admit: 2016-01-08 | Discharge: 2016-01-08 | Disposition: A | Discharge status: Home / Self Care | Payer: Medicare Other | Source: Ambulatory Visit | Attending: Radiation Oncology | Admitting: Radiation Oncology

## 2016-01-08 DIAGNOSIS — E785 Hyperlipidemia, unspecified: Secondary | ICD-10-CM | POA: Diagnosis not present

## 2016-01-08 DIAGNOSIS — R0602 Shortness of breath: Secondary | ICD-10-CM | POA: Diagnosis not present

## 2016-01-08 DIAGNOSIS — I2510 Atherosclerotic heart disease of native coronary artery without angina pectoris: Secondary | ICD-10-CM | POA: Diagnosis not present

## 2016-01-08 DIAGNOSIS — Z801 Family history of malignant neoplasm of trachea, bronchus and lung: Secondary | ICD-10-CM | POA: Diagnosis not present

## 2016-01-08 DIAGNOSIS — E119 Type 2 diabetes mellitus without complications: Secondary | ICD-10-CM | POA: Diagnosis not present

## 2016-01-08 DIAGNOSIS — Z8673 Personal history of transient ischemic attack (TIA), and cerebral infarction without residual deficits: Secondary | ICD-10-CM | POA: Diagnosis not present

## 2016-01-08 DIAGNOSIS — J449 Chronic obstructive pulmonary disease, unspecified: Secondary | ICD-10-CM | POA: Diagnosis not present

## 2016-01-08 DIAGNOSIS — Z510 Encounter for antineoplastic radiation therapy: Secondary | ICD-10-CM | POA: Diagnosis not present

## 2016-01-08 DIAGNOSIS — Z7982 Long term (current) use of aspirin: Secondary | ICD-10-CM | POA: Diagnosis not present

## 2016-01-08 DIAGNOSIS — I10 Essential (primary) hypertension: Secondary | ICD-10-CM | POA: Diagnosis not present

## 2016-01-08 DIAGNOSIS — Z7984 Long term (current) use of oral hypoglycemic drugs: Secondary | ICD-10-CM | POA: Diagnosis not present

## 2016-01-08 DIAGNOSIS — C3411 Malignant neoplasm of upper lobe, right bronchus or lung: Secondary | ICD-10-CM | POA: Diagnosis not present

## 2016-01-08 DIAGNOSIS — Z79899 Other long term (current) drug therapy: Secondary | ICD-10-CM | POA: Diagnosis not present

## 2016-01-08 DIAGNOSIS — Z87891 Personal history of nicotine dependence: Secondary | ICD-10-CM | POA: Diagnosis not present

## 2016-01-09 ENCOUNTER — Ambulatory Visit
Admission: RE | Admit: 2016-01-09 | Discharge: 2016-01-09 | Disposition: A | Discharge status: Home / Self Care | Payer: Medicare Other | Source: Ambulatory Visit | Attending: Radiation Oncology | Admitting: Radiation Oncology

## 2016-01-09 DIAGNOSIS — C3411 Malignant neoplasm of upper lobe, right bronchus or lung: Secondary | ICD-10-CM | POA: Diagnosis not present

## 2016-01-09 DIAGNOSIS — E119 Type 2 diabetes mellitus without complications: Secondary | ICD-10-CM | POA: Diagnosis not present

## 2016-01-09 DIAGNOSIS — I2510 Atherosclerotic heart disease of native coronary artery without angina pectoris: Secondary | ICD-10-CM | POA: Diagnosis not present

## 2016-01-09 DIAGNOSIS — Z79899 Other long term (current) drug therapy: Secondary | ICD-10-CM | POA: Diagnosis not present

## 2016-01-09 DIAGNOSIS — Z7982 Long term (current) use of aspirin: Secondary | ICD-10-CM | POA: Diagnosis not present

## 2016-01-09 DIAGNOSIS — Z87891 Personal history of nicotine dependence: Secondary | ICD-10-CM | POA: Diagnosis not present

## 2016-01-09 DIAGNOSIS — Z510 Encounter for antineoplastic radiation therapy: Secondary | ICD-10-CM | POA: Diagnosis not present

## 2016-01-09 DIAGNOSIS — J449 Chronic obstructive pulmonary disease, unspecified: Secondary | ICD-10-CM | POA: Diagnosis not present

## 2016-01-09 DIAGNOSIS — R0602 Shortness of breath: Secondary | ICD-10-CM | POA: Diagnosis not present

## 2016-01-09 DIAGNOSIS — Z8673 Personal history of transient ischemic attack (TIA), and cerebral infarction without residual deficits: Secondary | ICD-10-CM | POA: Diagnosis not present

## 2016-01-09 DIAGNOSIS — I10 Essential (primary) hypertension: Secondary | ICD-10-CM | POA: Diagnosis not present

## 2016-01-09 DIAGNOSIS — Z7984 Long term (current) use of oral hypoglycemic drugs: Secondary | ICD-10-CM | POA: Diagnosis not present

## 2016-01-09 DIAGNOSIS — Z801 Family history of malignant neoplasm of trachea, bronchus and lung: Secondary | ICD-10-CM | POA: Diagnosis not present

## 2016-01-09 DIAGNOSIS — E785 Hyperlipidemia, unspecified: Secondary | ICD-10-CM | POA: Diagnosis not present

## 2016-01-09 LAB — POCT GLYCOSYLATED HEMOGLOBIN (HGB A1C): HEMOGLOBIN A1C: 9.8

## 2016-01-10 ENCOUNTER — Ambulatory Visit
Admission: RE | Admit: 2016-01-10 | Discharge: 2016-01-10 | Disposition: A | Discharge status: Home / Self Care | Payer: Medicare Other | Source: Ambulatory Visit | Attending: Radiation Oncology | Admitting: Radiation Oncology

## 2016-01-10 DIAGNOSIS — Z7984 Long term (current) use of oral hypoglycemic drugs: Secondary | ICD-10-CM | POA: Diagnosis not present

## 2016-01-10 DIAGNOSIS — Z8673 Personal history of transient ischemic attack (TIA), and cerebral infarction without residual deficits: Secondary | ICD-10-CM | POA: Diagnosis not present

## 2016-01-10 DIAGNOSIS — R0602 Shortness of breath: Secondary | ICD-10-CM | POA: Diagnosis not present

## 2016-01-10 DIAGNOSIS — I10 Essential (primary) hypertension: Secondary | ICD-10-CM | POA: Diagnosis not present

## 2016-01-10 DIAGNOSIS — E119 Type 2 diabetes mellitus without complications: Secondary | ICD-10-CM | POA: Diagnosis not present

## 2016-01-10 DIAGNOSIS — Z79899 Other long term (current) drug therapy: Secondary | ICD-10-CM | POA: Diagnosis not present

## 2016-01-10 DIAGNOSIS — Z7982 Long term (current) use of aspirin: Secondary | ICD-10-CM | POA: Diagnosis not present

## 2016-01-10 DIAGNOSIS — Z87891 Personal history of nicotine dependence: Secondary | ICD-10-CM | POA: Diagnosis not present

## 2016-01-10 DIAGNOSIS — Z510 Encounter for antineoplastic radiation therapy: Secondary | ICD-10-CM | POA: Diagnosis not present

## 2016-01-10 DIAGNOSIS — Z801 Family history of malignant neoplasm of trachea, bronchus and lung: Secondary | ICD-10-CM | POA: Diagnosis not present

## 2016-01-10 DIAGNOSIS — C3411 Malignant neoplasm of upper lobe, right bronchus or lung: Secondary | ICD-10-CM | POA: Diagnosis not present

## 2016-01-10 DIAGNOSIS — J449 Chronic obstructive pulmonary disease, unspecified: Secondary | ICD-10-CM | POA: Diagnosis not present

## 2016-01-10 DIAGNOSIS — E785 Hyperlipidemia, unspecified: Secondary | ICD-10-CM | POA: Diagnosis not present

## 2016-01-10 DIAGNOSIS — I2510 Atherosclerotic heart disease of native coronary artery without angina pectoris: Secondary | ICD-10-CM | POA: Diagnosis not present

## 2016-01-13 ENCOUNTER — Ambulatory Visit
Admission: RE | Admit: 2016-01-13 | Discharge: 2016-01-13 | Disposition: A | Discharge status: Home / Self Care | Payer: Medicare Other | Source: Ambulatory Visit | Attending: Radiation Oncology | Admitting: Radiation Oncology

## 2016-01-13 DIAGNOSIS — J449 Chronic obstructive pulmonary disease, unspecified: Secondary | ICD-10-CM | POA: Diagnosis not present

## 2016-01-13 DIAGNOSIS — Z87891 Personal history of nicotine dependence: Secondary | ICD-10-CM | POA: Diagnosis not present

## 2016-01-13 DIAGNOSIS — E785 Hyperlipidemia, unspecified: Secondary | ICD-10-CM | POA: Diagnosis not present

## 2016-01-13 DIAGNOSIS — E119 Type 2 diabetes mellitus without complications: Secondary | ICD-10-CM | POA: Diagnosis not present

## 2016-01-13 DIAGNOSIS — Z7984 Long term (current) use of oral hypoglycemic drugs: Secondary | ICD-10-CM | POA: Diagnosis not present

## 2016-01-13 DIAGNOSIS — Z510 Encounter for antineoplastic radiation therapy: Secondary | ICD-10-CM | POA: Diagnosis not present

## 2016-01-13 DIAGNOSIS — Z79899 Other long term (current) drug therapy: Secondary | ICD-10-CM | POA: Diagnosis not present

## 2016-01-13 DIAGNOSIS — Z8673 Personal history of transient ischemic attack (TIA), and cerebral infarction without residual deficits: Secondary | ICD-10-CM | POA: Diagnosis not present

## 2016-01-13 DIAGNOSIS — I2510 Atherosclerotic heart disease of native coronary artery without angina pectoris: Secondary | ICD-10-CM | POA: Diagnosis not present

## 2016-01-13 DIAGNOSIS — Z801 Family history of malignant neoplasm of trachea, bronchus and lung: Secondary | ICD-10-CM | POA: Diagnosis not present

## 2016-01-13 DIAGNOSIS — I10 Essential (primary) hypertension: Secondary | ICD-10-CM | POA: Diagnosis not present

## 2016-01-13 DIAGNOSIS — Z7982 Long term (current) use of aspirin: Secondary | ICD-10-CM | POA: Diagnosis not present

## 2016-01-13 DIAGNOSIS — C3411 Malignant neoplasm of upper lobe, right bronchus or lung: Secondary | ICD-10-CM | POA: Diagnosis not present

## 2016-01-13 DIAGNOSIS — R0602 Shortness of breath: Secondary | ICD-10-CM | POA: Diagnosis not present

## 2016-01-14 ENCOUNTER — Ambulatory Visit
Admission: RE | Admit: 2016-01-14 | Discharge: 2016-01-14 | Disposition: A | Discharge status: Home / Self Care | Payer: Medicare Other | Source: Ambulatory Visit | Attending: Radiation Oncology | Admitting: Radiation Oncology

## 2016-01-14 DIAGNOSIS — Z801 Family history of malignant neoplasm of trachea, bronchus and lung: Secondary | ICD-10-CM | POA: Diagnosis not present

## 2016-01-14 DIAGNOSIS — Z87891 Personal history of nicotine dependence: Secondary | ICD-10-CM | POA: Diagnosis not present

## 2016-01-14 DIAGNOSIS — R0602 Shortness of breath: Secondary | ICD-10-CM | POA: Diagnosis not present

## 2016-01-14 DIAGNOSIS — C3411 Malignant neoplasm of upper lobe, right bronchus or lung: Secondary | ICD-10-CM | POA: Diagnosis not present

## 2016-01-14 DIAGNOSIS — E785 Hyperlipidemia, unspecified: Secondary | ICD-10-CM | POA: Diagnosis not present

## 2016-01-14 DIAGNOSIS — Z7982 Long term (current) use of aspirin: Secondary | ICD-10-CM | POA: Diagnosis not present

## 2016-01-14 DIAGNOSIS — J449 Chronic obstructive pulmonary disease, unspecified: Secondary | ICD-10-CM | POA: Diagnosis not present

## 2016-01-14 DIAGNOSIS — Z510 Encounter for antineoplastic radiation therapy: Secondary | ICD-10-CM | POA: Diagnosis not present

## 2016-01-14 DIAGNOSIS — Z7984 Long term (current) use of oral hypoglycemic drugs: Secondary | ICD-10-CM | POA: Diagnosis not present

## 2016-01-14 DIAGNOSIS — Z79899 Other long term (current) drug therapy: Secondary | ICD-10-CM | POA: Diagnosis not present

## 2016-01-14 DIAGNOSIS — I2510 Atherosclerotic heart disease of native coronary artery without angina pectoris: Secondary | ICD-10-CM | POA: Diagnosis not present

## 2016-01-14 DIAGNOSIS — E119 Type 2 diabetes mellitus without complications: Secondary | ICD-10-CM | POA: Diagnosis not present

## 2016-01-14 DIAGNOSIS — I10 Essential (primary) hypertension: Secondary | ICD-10-CM | POA: Diagnosis not present

## 2016-01-14 DIAGNOSIS — Z8673 Personal history of transient ischemic attack (TIA), and cerebral infarction without residual deficits: Secondary | ICD-10-CM | POA: Diagnosis not present

## 2016-01-15 ENCOUNTER — Ambulatory Visit
Admission: RE | Admit: 2016-01-15 | Discharge: 2016-01-15 | Disposition: A | Discharge status: Home / Self Care | Payer: Medicare Other | Source: Ambulatory Visit | Attending: Radiation Oncology | Admitting: Radiation Oncology

## 2016-01-15 DIAGNOSIS — I10 Essential (primary) hypertension: Secondary | ICD-10-CM | POA: Diagnosis not present

## 2016-01-15 DIAGNOSIS — Z510 Encounter for antineoplastic radiation therapy: Secondary | ICD-10-CM | POA: Diagnosis not present

## 2016-01-15 DIAGNOSIS — Z87891 Personal history of nicotine dependence: Secondary | ICD-10-CM | POA: Diagnosis not present

## 2016-01-15 DIAGNOSIS — E119 Type 2 diabetes mellitus without complications: Secondary | ICD-10-CM | POA: Diagnosis not present

## 2016-01-15 DIAGNOSIS — Z79899 Other long term (current) drug therapy: Secondary | ICD-10-CM | POA: Diagnosis not present

## 2016-01-15 DIAGNOSIS — E785 Hyperlipidemia, unspecified: Secondary | ICD-10-CM | POA: Diagnosis not present

## 2016-01-15 DIAGNOSIS — R0602 Shortness of breath: Secondary | ICD-10-CM | POA: Diagnosis not present

## 2016-01-15 DIAGNOSIS — Z801 Family history of malignant neoplasm of trachea, bronchus and lung: Secondary | ICD-10-CM | POA: Diagnosis not present

## 2016-01-15 DIAGNOSIS — Z8673 Personal history of transient ischemic attack (TIA), and cerebral infarction without residual deficits: Secondary | ICD-10-CM | POA: Diagnosis not present

## 2016-01-15 DIAGNOSIS — Z7984 Long term (current) use of oral hypoglycemic drugs: Secondary | ICD-10-CM | POA: Diagnosis not present

## 2016-01-15 DIAGNOSIS — C3411 Malignant neoplasm of upper lobe, right bronchus or lung: Secondary | ICD-10-CM | POA: Diagnosis not present

## 2016-01-15 DIAGNOSIS — I2510 Atherosclerotic heart disease of native coronary artery without angina pectoris: Secondary | ICD-10-CM | POA: Diagnosis not present

## 2016-01-15 DIAGNOSIS — J449 Chronic obstructive pulmonary disease, unspecified: Secondary | ICD-10-CM | POA: Diagnosis not present

## 2016-01-15 DIAGNOSIS — Z7982 Long term (current) use of aspirin: Secondary | ICD-10-CM | POA: Diagnosis not present

## 2016-01-16 ENCOUNTER — Ambulatory Visit
Admission: RE | Admit: 2016-01-16 | Discharge: 2016-01-16 | Disposition: A | Discharge status: Home / Self Care | Payer: Medicare Other | Source: Ambulatory Visit | Attending: Radiation Oncology | Admitting: Radiation Oncology

## 2016-01-16 DIAGNOSIS — Z801 Family history of malignant neoplasm of trachea, bronchus and lung: Secondary | ICD-10-CM | POA: Diagnosis not present

## 2016-01-16 DIAGNOSIS — C3411 Malignant neoplasm of upper lobe, right bronchus or lung: Secondary | ICD-10-CM | POA: Diagnosis not present

## 2016-01-16 DIAGNOSIS — I2510 Atherosclerotic heart disease of native coronary artery without angina pectoris: Secondary | ICD-10-CM | POA: Diagnosis not present

## 2016-01-16 DIAGNOSIS — I10 Essential (primary) hypertension: Secondary | ICD-10-CM | POA: Diagnosis not present

## 2016-01-16 DIAGNOSIS — Z87891 Personal history of nicotine dependence: Secondary | ICD-10-CM | POA: Diagnosis not present

## 2016-01-16 DIAGNOSIS — E785 Hyperlipidemia, unspecified: Secondary | ICD-10-CM | POA: Diagnosis not present

## 2016-01-16 DIAGNOSIS — Z7984 Long term (current) use of oral hypoglycemic drugs: Secondary | ICD-10-CM | POA: Diagnosis not present

## 2016-01-16 DIAGNOSIS — R0602 Shortness of breath: Secondary | ICD-10-CM | POA: Diagnosis not present

## 2016-01-16 DIAGNOSIS — Z79899 Other long term (current) drug therapy: Secondary | ICD-10-CM | POA: Diagnosis not present

## 2016-01-16 DIAGNOSIS — Z7982 Long term (current) use of aspirin: Secondary | ICD-10-CM | POA: Diagnosis not present

## 2016-01-16 DIAGNOSIS — Z8673 Personal history of transient ischemic attack (TIA), and cerebral infarction without residual deficits: Secondary | ICD-10-CM | POA: Diagnosis not present

## 2016-01-16 DIAGNOSIS — Z510 Encounter for antineoplastic radiation therapy: Secondary | ICD-10-CM | POA: Diagnosis not present

## 2016-01-16 DIAGNOSIS — E119 Type 2 diabetes mellitus without complications: Secondary | ICD-10-CM | POA: Diagnosis not present

## 2016-01-16 DIAGNOSIS — J449 Chronic obstructive pulmonary disease, unspecified: Secondary | ICD-10-CM | POA: Diagnosis not present

## 2016-01-17 ENCOUNTER — Ambulatory Visit
Admission: RE | Admit: 2016-01-17 | Discharge: 2016-01-17 | Disposition: A | Discharge status: Home / Self Care | Payer: Medicare Other | Source: Ambulatory Visit | Attending: Radiation Oncology | Admitting: Radiation Oncology

## 2016-01-17 DIAGNOSIS — Z7982 Long term (current) use of aspirin: Secondary | ICD-10-CM | POA: Diagnosis not present

## 2016-01-17 DIAGNOSIS — I2510 Atherosclerotic heart disease of native coronary artery without angina pectoris: Secondary | ICD-10-CM | POA: Diagnosis not present

## 2016-01-17 DIAGNOSIS — Z87891 Personal history of nicotine dependence: Secondary | ICD-10-CM | POA: Diagnosis not present

## 2016-01-17 DIAGNOSIS — R0602 Shortness of breath: Secondary | ICD-10-CM | POA: Diagnosis not present

## 2016-01-17 DIAGNOSIS — E119 Type 2 diabetes mellitus without complications: Secondary | ICD-10-CM | POA: Diagnosis not present

## 2016-01-17 DIAGNOSIS — Z510 Encounter for antineoplastic radiation therapy: Secondary | ICD-10-CM | POA: Diagnosis not present

## 2016-01-17 DIAGNOSIS — J449 Chronic obstructive pulmonary disease, unspecified: Secondary | ICD-10-CM | POA: Diagnosis not present

## 2016-01-17 DIAGNOSIS — Z801 Family history of malignant neoplasm of trachea, bronchus and lung: Secondary | ICD-10-CM | POA: Diagnosis not present

## 2016-01-17 DIAGNOSIS — E785 Hyperlipidemia, unspecified: Secondary | ICD-10-CM | POA: Diagnosis not present

## 2016-01-17 DIAGNOSIS — Z7984 Long term (current) use of oral hypoglycemic drugs: Secondary | ICD-10-CM | POA: Diagnosis not present

## 2016-01-17 DIAGNOSIS — Z79899 Other long term (current) drug therapy: Secondary | ICD-10-CM | POA: Diagnosis not present

## 2016-01-17 DIAGNOSIS — I10 Essential (primary) hypertension: Secondary | ICD-10-CM | POA: Diagnosis not present

## 2016-01-17 DIAGNOSIS — Z8673 Personal history of transient ischemic attack (TIA), and cerebral infarction without residual deficits: Secondary | ICD-10-CM | POA: Diagnosis not present

## 2016-01-17 DIAGNOSIS — C3411 Malignant neoplasm of upper lobe, right bronchus or lung: Secondary | ICD-10-CM | POA: Diagnosis not present

## 2016-01-20 ENCOUNTER — Ambulatory Visit
Admission: RE | Admit: 2016-01-20 | Discharge: 2016-01-20 | Disposition: A | Discharge status: Home / Self Care | Payer: Medicare Other | Source: Ambulatory Visit | Attending: Radiation Oncology | Admitting: Radiation Oncology

## 2016-01-20 DIAGNOSIS — E119 Type 2 diabetes mellitus without complications: Secondary | ICD-10-CM | POA: Diagnosis not present

## 2016-01-20 DIAGNOSIS — C3411 Malignant neoplasm of upper lobe, right bronchus or lung: Secondary | ICD-10-CM | POA: Diagnosis not present

## 2016-01-20 DIAGNOSIS — I10 Essential (primary) hypertension: Secondary | ICD-10-CM | POA: Diagnosis not present

## 2016-01-20 DIAGNOSIS — I2510 Atherosclerotic heart disease of native coronary artery without angina pectoris: Secondary | ICD-10-CM | POA: Diagnosis not present

## 2016-01-20 DIAGNOSIS — Z7982 Long term (current) use of aspirin: Secondary | ICD-10-CM | POA: Diagnosis not present

## 2016-01-20 DIAGNOSIS — Z87891 Personal history of nicotine dependence: Secondary | ICD-10-CM | POA: Diagnosis not present

## 2016-01-20 DIAGNOSIS — Z801 Family history of malignant neoplasm of trachea, bronchus and lung: Secondary | ICD-10-CM | POA: Diagnosis not present

## 2016-01-20 DIAGNOSIS — E785 Hyperlipidemia, unspecified: Secondary | ICD-10-CM | POA: Diagnosis not present

## 2016-01-20 DIAGNOSIS — Z79899 Other long term (current) drug therapy: Secondary | ICD-10-CM | POA: Diagnosis not present

## 2016-01-20 DIAGNOSIS — Z510 Encounter for antineoplastic radiation therapy: Secondary | ICD-10-CM | POA: Diagnosis not present

## 2016-01-20 DIAGNOSIS — Z7984 Long term (current) use of oral hypoglycemic drugs: Secondary | ICD-10-CM | POA: Diagnosis not present

## 2016-01-20 DIAGNOSIS — Z8673 Personal history of transient ischemic attack (TIA), and cerebral infarction without residual deficits: Secondary | ICD-10-CM | POA: Diagnosis not present

## 2016-01-20 DIAGNOSIS — R0602 Shortness of breath: Secondary | ICD-10-CM | POA: Diagnosis not present

## 2016-01-20 DIAGNOSIS — J449 Chronic obstructive pulmonary disease, unspecified: Secondary | ICD-10-CM | POA: Diagnosis not present

## 2016-01-20 NOTE — Progress Notes (Signed)
[place]  [phone]) [phone] Fax:(336) [phone]  ID: [name] [name] OB: 24-Aug-1940  MR#: [id number]  IRS#:[id number]  Patient Care Team: Jerrol Banana., MD as PCP - General (Unknown Physician Specialty) [name], MD as Consulting Physician (Cardiology) Algernon Huxley, MD as Consulting Physician (Vascular Surgery)  CHIEF COMPLAINT: Clinical stage IIIa small cell lung carcinoma of the right upper lobe lung.  INTERVAL HISTORY: Patient returns to clinic today for further evaluation and consideration of cycle 3 of 4 of cisplatin and etoposide. She has had increased nausea and vomiting over the past week. She continues to have a chronic cough and shortness of breath. She has no neurologic complaints. She denies any recent fevers or illnesses. She has a poor appetite but denies weight loss. She has no chest pain. She denies any constipation or diarrhea. She has no urinary complaints. Patient feels generally terrible, but offers no further specific complaints today.  REVIEW OF SYSTEMS:   Review of Systems  Constitutional: Negative.  Negative for fever, malaise/fatigue and weight loss.  Respiratory: Positive for cough and shortness of breath.   Cardiovascular: Negative.  Negative for chest pain.  Gastrointestinal: Positive for nausea and vomiting. Negative for abdominal pain.  Musculoskeletal: Negative.   Neurological: Negative.   Psychiatric/Behavioral: Negative.  The patient is not nervous/anxious.     As per HPI. Otherwise, a complete review of systems is negative.  PAST MEDICAL HISTORY: Past Medical History:  Diagnosis Date  . Abnormal CT lung screening 10/17/2015  . COPD (chronic obstructive pulmonary disease) ([place])   . Coronary artery disease, non-occlusive    a. cath 2006: min nonobs CAD; b. cath 12/2010: cath LAD 50%, RCA 60%; c. 08/2013: Minimal luminal irregs, right dominant system with no significant CAD, diffuse luminal irregs noted. Normal EF  55%, no AS or MS.   . Diabetes mellitus   . Hyperlipemia    Followed by Dr. Rosanna Randy  . Hypertension   . Lung cancer ([place])   . Macular degeneration    rt  . Personal history of tobacco use, presenting hazards to health 10/15/2015  . Pneumonia    hx  . Shortness of breath   . Stroke [facility])     PAST SURGICAL HISTORY: Past Surgical History:  Procedure Laterality Date  . ABDOMINAL HYSTERECTOMY    . ANTERIOR CERVICAL DECOMP/DISCECTOMY FUSION N/A 10/19/2013   Procedure: CERVICAL FIVE-SIX ANTERIOR CERVICAL DECOMPRESSION WITH FUSION INTERBODY PROSTHESIS PLATING AND PEEK CAGE;  Surgeon: Ophelia Charter, MD;  Location: [place] NEURO ORS;  Service: Neurosurgery;  Laterality: N/A;  . BACK SURGERY  80's  . BREAST CYST EXCISION Left    left negative   . CARDIAC CATHETERIZATION  05/2004  . CATARACT EXTRACTION Left   . CHOLECYSTECTOMY    . ENDARTERECTOMY Left 10/24/2014   Procedure: ENDARTERECTOMY CAROTID;  Surgeon: Algernon Huxley, MD;  Location: [facility] ORS;  Service: Vascular;  Laterality: Left;  . ENDOBRONCHIAL ULTRASOUND N/A 11/14/2015   Procedure: ENDOBRONCHIAL ULTRASOUND;  Surgeon: [name], MD;  Location: [facility] ORS;  Service: Cardiopulmonary;  Laterality: N/A;  . PERIPHERAL VASCULAR CATHETERIZATION N/A 12/04/2015   Procedure: Glori Luis Cath Insertion;  Surgeon: Algernon Huxley, MD;  Location: [place] CV LAB;  Service: Cardiovascular;  Laterality: N/A;  . TONSILLECTOMY AND ADENOIDECTOMY    . VESICOVAGINAL FISTULA CLOSURE W/ TAH      FAMILY HISTORY: Family History  Problem Relation Age of Onset  . Stroke Mother     Massive  . Heart attack  Father     Massive  . Heart attack Brother   . Heart attack Brother   . Lung cancer Maternal Grandfather   . Heart attack Paternal Grandmother     MI       ADVANCED DIRECTIVES (Y/N):  N   HEALTH MAINTENANCE: Social History  Substance Use Topics  . Smoking status: Former Smoker    Packs/day: 1.00    Years: 50.00    Types: Cigarettes    Quit date:  10/03/2015  . Smokeless tobacco: Never Used     Comment: smokes 3 cigs daily 05/06/15. Pt instructed to quit.  . Alcohol use No     Colonoscopy:  PAP:  Bone density:  Lipid panel:  Allergies  Allergen Reactions  . Coconut Fatty Acids Swelling    Throat swells    Current Outpatient Prescriptions  Medication Sig Dispense Refill  . albuterol (PROVENTIL) (5 MG/ML) 0.5% nebulizer solution Take 2.5 mg by nebulization 2 (two) times daily.    Marland Kitchen aspirin 81 MG tablet Take 81 mg by mouth every morning.     Marland Kitchen atorvastatin (LIPITOR) 20 MG tablet Take 1 tablet by mouth at  bedtime 90 tablet 3  . budesonide-formoterol (SYMBICORT) 160-4.5 MCG/ACT inhaler Inhale 2 puffs into the lungs 2 (two) times daily. 1 Inhaler 0  . clopidogrel (PLAVIX) 75 MG tablet Take 75 mg by mouth once.     . diazepam (VALIUM) 5 MG tablet Take 1 tablet (5 mg total) by mouth every 6 (six) hours as needed for muscle spasms. 50 tablet 1  . gabapentin (NEURONTIN) 600 MG tablet Take 1 tablet by mouth two  times daily 180 tablet 3  . glimepiride (AMARYL) 4 MG tablet Take 1 tablet by mouth two  times daily 180 tablet 3  . isosorbide mononitrate (IMDUR) 30 MG 24 hr tablet Take 1 tablet by mouth  daily 90 tablet 3  . lidocaine-prilocaine (EMLA) cream Apply to port 1-2 hours prior to chemotherapy appointment. Cover with plastic wrap. 90 g 3  . losartan-hydrochlorothiazide (HYZAAR) 100-25 MG tablet Take 1 tablet by mouth  daily 90 tablet 3  . magnesium oxide (MAG-OX) 400 (241.3 MG) MG tablet Take 1 tablet by mouth 2 (two) times daily.    . metFORMIN (GLUCOPHAGE) 1000 MG tablet Take 1 tablet by mouth  every day 90 tablet 3  . Multiple Vitamins-Minerals (ICAPS) CAPS Take 2 capsules by mouth daily. Reported on 04/17/2015    . naproxen sodium (ANAPROX) 220 MG tablet Take 220 mg by mouth as needed. Takes twice a week as needed.    . nitroGLYCERIN (NITROSTAT) 0.4 MG SL tablet Place 1 tablet (0.4 mg total) under the tongue every 5 (five)  minutes as needed for chest pain. 25 tablet 3  . ondansetron (ZOFRAN) 8 MG tablet Take 1 tablet (8 mg total) by mouth 2 (two) times daily as needed. 30 tablet 1  . oxymetazoline (NO DRIP NASAL SPRAY) 0.05 % nasal spray Place 1 spray into both nostrils 2 (two) times daily.    . pantoprazole (PROTONIX) 40 MG tablet Take 1 tablet by mouth  daily 90 tablet 3  . pioglitazone (ACTOS) 30 MG tablet Take 1 tablet (30 mg total) by mouth daily. 90 tablet 3  . potassium chloride (K-DUR) 10 MEQ tablet Take 3 tablets by mouth  daily as directed 270 tablet 1  . prochlorperazine (COMPAZINE) 10 MG tablet Take 1 tablet (10 mg total) by mouth every 6 (six) hours as needed (Nausea or vomiting). [place]  tablet 1  . cephALEXin (KEFLEX) 500 MG capsule Take 1 capsule (500 mg total) by mouth 3 (three) times daily. 21 capsule 0   No current facility-administered medications for this visit.    Facility-Administered Medications Ordered in Other Visits  Medication Dose Route Frequency Provider Last Rate Last Dose  . ondansetron (ZOFRAN) 8 mg, dexamethasone (DECADRON) 10 mg in sodium chloride 0.9 % 50 mL IVPB   Intravenous Once Lloyd Huger, MD        OBJECTIVE: Vitals:   01/21/16 0955  BP: (!) 144/77  Pulse: (!) 101  Resp: 18  Temp: 97.5 F (36.4 C)     Body mass index is 26.09 kg/m.    ECOG FS:0 - Asymptomatic  General: Well-developed, well-nourished, no acute distress. Eyes: Pink conjunctiva, anicteric sclera. Lungs: Clear to auscultation bilaterally. Heart: Regular rate and rhythm. No rubs, murmurs, or gallops. Abdomen: Soft, nontender, nondistended. No organomegaly noted, normoactive bowel sounds. Musculoskeletal: No edema, cyanosis, or clubbing. Neuro: Alert, answering all questions appropriately. Cranial nerves grossly intact. Skin: No rashes or petechiae noted. Psych: Normal affect.   LAB RESULTS:  Lab Results  Component Value Date   NA 132 (L) 01/21/2016   K 5.2 (H) 01/21/2016   CL 101  01/21/2016   CO2 23 01/21/2016   GLUCOSE 333 (H) 01/21/2016   BUN 26 (H) 01/21/2016   CREATININE 1.02 (H) 01/21/2016   CALCIUM 8.1 (L) 01/21/2016   PROT 7.1 01/21/2016   ALBUMIN 3.7 01/21/2016   AST 17 01/21/2016   ALT 22 01/21/2016   ALKPHOS 110 01/21/2016   BILITOT 0.8 01/21/2016   GFRNONAA 53 (L) 01/21/2016   GFRAA >60 01/21/2016    Lab Results  Component Value Date   WBC 11.4 (H) 01/21/2016   NEUTROABS 9.9 (H) 01/21/2016   HGB 10.6 (L) 01/21/2016   HCT 31.1 (L) 01/21/2016   MCV 93.4 01/21/2016   PLT 282 01/21/2016     STUDIES: Dg Chest 2 View  Result Date: 01/21/2016 CLINICAL DATA:  Chronic cough and shortness of breath. Stage III small cell right upper lobe lung cancer. EXAM: CHEST  2 VIEW COMPARISON:  PET-CT 10/23/2015. Chest CT 10/15/2015. Chest radiographs 01/27/2015. FINDINGS: Left jugular Port-A-Cath terminates over the lower SVC. The cardiomediastinal silhouette is within normal limits. Right upper lobe nodule on PET-CT is not well seen on these radiographs. No airspace consolidation, edema, pleural effusion, or pneumothorax is identified. Right upper quadrant abdominal surgical clips and prior ACDF are noted. No acute osseous abnormality is seen. IMPRESSION: No evidence of acute cardiopulmonary process. Electronically Signed   By: Logan Bores M.D.   On: 01/21/2016 12:14    ASSESSMENT: Clinical stage IIIa small cell lung carcinoma of the right upper lobe lung.  PLAN:    1. Clinical stage IIIa small cell lung carcinoma of the right upper lobe lung: CT and PET scan results reviewed independently concerning for stage IIIa disease given patient's 11 mm right paratracheal lymph node positive for small cell lung cancer. Patient has no other evidence of metastatic disease. MRI the brain is negative. Continue daily XRT completing on January 24, 2016. Delay cycle 3 today secondary to persistent nausea vomiting and elevated blood sugars. Patient was instead transfer the ER for  treatment. Return to clinic in 1 week for reconsideration of cycle 3. Patient will receive cisplatin on day 1 and etoposide on days 1 and 2 and 3 today. Will add in Neulasta support if necessary at the conclusion of her XRT. Plan to  reimage with PET scan after the conclusion of cycle 4.  2. Cough: Continue OTC treatments as needed. 3. Hyperglycemia: Patient's blood sugars significantly elevated today of greater than 500. She was referred to the emergency room for treatment and evaluation.  4. Nausea and vomiting: Continue Zofran and Compazine as needed. Likely secondary to her significantly elevated blood glucose. 5. Anemia: Mild, monitor.  Patient expressed understanding and was in agreement with this plan. She also understands that She can call clinic at any time with any questions, concerns, or complaints.   Primary cancer of right upper lobe of lung [facility])   Staging form: Lung, AJCC 7th Edition   - Clinical stage from 11/26/2015: Stage IIIA (T1a, N2, M0) - Signed by Lloyd Huger, MD on 11/26/2015  Lloyd Huger, MD   01/24/2016 12:42 PM

## 2016-01-21 ENCOUNTER — Ambulatory Visit
Admission: RE | Admit: 2016-01-21 | Discharge: 2016-01-21 | Disposition: A | Discharge status: Home / Self Care | Payer: Medicare Other | Source: Ambulatory Visit | Attending: Radiation Oncology | Admitting: Radiation Oncology

## 2016-01-21 ENCOUNTER — Inpatient Hospital Stay: Payer: Medicare Other

## 2016-01-21 ENCOUNTER — Emergency Department
Admission: EM | Admit: 2016-01-21 | Discharge: 2016-01-21 | Disposition: A | Discharge status: Home / Self Care | Payer: Medicare Other | Attending: Emergency Medicine | Admitting: Emergency Medicine

## 2016-01-21 ENCOUNTER — Inpatient Hospital Stay (HOSPITAL_BASED_OUTPATIENT_CLINIC_OR_DEPARTMENT_OTHER): Payer: Medicare Other | Admitting: Oncology

## 2016-01-21 ENCOUNTER — Emergency Department: Payer: Medicare Other

## 2016-01-21 ENCOUNTER — Encounter: Payer: Self-pay | Admitting: Emergency Medicine

## 2016-01-21 VITALS — BP 144/77 | HR 101 | Temp 97.5°F | Resp 18 | Wt 152.0 lb

## 2016-01-21 DIAGNOSIS — Z510 Encounter for antineoplastic radiation therapy: Secondary | ICD-10-CM | POA: Diagnosis not present

## 2016-01-21 DIAGNOSIS — D649 Anemia, unspecified: Secondary | ICD-10-CM

## 2016-01-21 DIAGNOSIS — I10 Essential (primary) hypertension: Secondary | ICD-10-CM | POA: Diagnosis not present

## 2016-01-21 DIAGNOSIS — Z7982 Long term (current) use of aspirin: Secondary | ICD-10-CM | POA: Insufficient documentation

## 2016-01-21 DIAGNOSIS — R319 Hematuria, unspecified: Secondary | ICD-10-CM | POA: Diagnosis not present

## 2016-01-21 DIAGNOSIS — Z7984 Long term (current) use of oral hypoglycemic drugs: Secondary | ICD-10-CM

## 2016-01-21 DIAGNOSIS — J449 Chronic obstructive pulmonary disease, unspecified: Secondary | ICD-10-CM | POA: Diagnosis not present

## 2016-01-21 DIAGNOSIS — J45909 Unspecified asthma, uncomplicated: Secondary | ICD-10-CM | POA: Insufficient documentation

## 2016-01-21 DIAGNOSIS — E1165 Type 2 diabetes mellitus with hyperglycemia: Secondary | ICD-10-CM | POA: Diagnosis not present

## 2016-01-21 DIAGNOSIS — Z85118 Personal history of other malignant neoplasm of bronchus and lung: Secondary | ICD-10-CM | POA: Diagnosis not present

## 2016-01-21 DIAGNOSIS — Z87891 Personal history of nicotine dependence: Secondary | ICD-10-CM | POA: Diagnosis not present

## 2016-01-21 DIAGNOSIS — E039 Hypothyroidism, unspecified: Secondary | ICD-10-CM | POA: Diagnosis not present

## 2016-01-21 DIAGNOSIS — R112 Nausea with vomiting, unspecified: Secondary | ICD-10-CM | POA: Diagnosis not present

## 2016-01-21 DIAGNOSIS — E785 Hyperlipidemia, unspecified: Secondary | ICD-10-CM

## 2016-01-21 DIAGNOSIS — R739 Hyperglycemia, unspecified: Secondary | ICD-10-CM

## 2016-01-21 DIAGNOSIS — R0602 Shortness of breath: Secondary | ICD-10-CM | POA: Diagnosis not present

## 2016-01-21 DIAGNOSIS — Z79899 Other long term (current) drug therapy: Secondary | ICD-10-CM | POA: Insufficient documentation

## 2016-01-21 DIAGNOSIS — N390 Urinary tract infection, site not specified: Secondary | ICD-10-CM

## 2016-01-21 DIAGNOSIS — C3411 Malignant neoplasm of upper lobe, right bronchus or lung: Secondary | ICD-10-CM

## 2016-01-21 DIAGNOSIS — I2510 Atherosclerotic heart disease of native coronary artery without angina pectoris: Secondary | ICD-10-CM | POA: Insufficient documentation

## 2016-01-21 DIAGNOSIS — Z8673 Personal history of transient ischemic attack (TIA), and cerebral infarction without residual deficits: Secondary | ICD-10-CM | POA: Diagnosis not present

## 2016-01-21 DIAGNOSIS — Z5111 Encounter for antineoplastic chemotherapy: Secondary | ICD-10-CM | POA: Diagnosis not present

## 2016-01-21 DIAGNOSIS — R05 Cough: Secondary | ICD-10-CM | POA: Diagnosis not present

## 2016-01-21 DIAGNOSIS — E119 Type 2 diabetes mellitus without complications: Secondary | ICD-10-CM | POA: Diagnosis not present

## 2016-01-21 DIAGNOSIS — R630 Anorexia: Secondary | ICD-10-CM

## 2016-01-21 DIAGNOSIS — J441 Chronic obstructive pulmonary disease with (acute) exacerbation: Secondary | ICD-10-CM | POA: Diagnosis not present

## 2016-01-21 DIAGNOSIS — Z801 Family history of malignant neoplasm of trachea, bronchus and lung: Secondary | ICD-10-CM | POA: Diagnosis not present

## 2016-01-21 LAB — URINALYSIS COMPLETE WITH MICROSCOPIC (ARMC ONLY)
Bilirubin Urine: NEGATIVE
Glucose, UA: >500 mg/dL — AB
Nitrite: NEGATIVE
PROTEIN: NEGATIVE mg/dL
Specific Gravity, Urine: 1.024 (ref 1.005–1.030)
pH: 5 (ref 5.0–8.0)

## 2016-01-21 LAB — COMPREHENSIVE METABOLIC PANEL
ALBUMIN: 3.7 g/dL (ref 3.5–5.0)
ALT: 22 U/L (ref 14–54)
AST: 17 U/L (ref 15–41)
Alkaline Phosphatase: 110 U/L (ref 38–126)
Anion gap: 12 (ref 5–15)
BUN: 30 mg/dL — AB (ref 6–20)
CHLORIDE: 92 mmol/L — AB (ref 101–111)
CO2: 21 mmol/L — AB (ref 22–32)
CREATININE: 1.16 mg/dL — AB (ref 0.44–1.00)
Calcium: 8.5 mg/dL — ABNORMAL LOW (ref 8.9–10.3)
GFR calc Af Amer: 53 mL/min — ABNORMAL LOW (ref >60)
GFR calc non Af Amer: 46 mL/min — ABNORMAL LOW (ref >60)
GLUCOSE: 542 mg/dL — AB (ref 65–99)
Potassium: 5 mmol/L (ref 3.5–5.1)
SODIUM: 125 mmol/L — AB (ref 135–145)
Total Bilirubin: 0.8 mg/dL (ref 0.3–1.2)
Total Protein: 7.1 g/dL (ref 6.5–8.1)

## 2016-01-21 LAB — BASIC METABOLIC PANEL
ANION GAP: 8 (ref 5–15)
Anion gap: 12 (ref 5–15)
BUN: 28 mg/dL — AB (ref 6–20)
BUN: 26 mg/dL — ABNORMAL HIGH (ref 6–20)
CALCIUM: 8.1 mg/dL — AB (ref 8.9–10.3)
CHLORIDE: 94 mmol/L — AB (ref 101–111)
CHLORIDE: 101 mmol/L (ref 101–111)
CO2: 21 mmol/L — AB (ref 22–32)
CO2: 23 mmol/L (ref 22–32)
CREATININE: 1.19 mg/dL — AB (ref 0.44–1.00)
CREATININE: 1.02 mg/dL — AB (ref 0.44–1.00)
Calcium: 8.8 mg/dL — ABNORMAL LOW (ref 8.9–10.3)
GFR calc Af Amer: 51 mL/min — ABNORMAL LOW (ref >60)
GFR calc non Af Amer: 44 mL/min — ABNORMAL LOW (ref >60)
GFR calc non Af Amer: 53 mL/min — ABNORMAL LOW (ref >60)
Glucose, Bld: 467 mg/dL — ABNORMAL HIGH (ref 65–99)
Glucose, Bld: 333 mg/dL — ABNORMAL HIGH (ref 65–99)
Potassium: 6.4 mmol/L (ref 3.5–5.1)
Potassium: 5.2 mmol/L — ABNORMAL HIGH (ref 3.5–5.1)
SODIUM: 127 mmol/L — AB (ref 135–145)
SODIUM: 132 mmol/L — AB (ref 135–145)

## 2016-01-21 LAB — CBC WITH DIFFERENTIAL/PLATELET
BASOS ABS: 0 10*3/uL (ref 0–0.1)
Basophils Absolute: 0.1 10*3/uL (ref 0–0.1)
Basophils Relative: 0 %
Basophils Relative: 1 %
EOS ABS: 0 10*3/uL (ref 0–0.7)
EOS PCT: 0 %
Eosinophils Absolute: 0 10*3/uL (ref 0–0.7)
Eosinophils Relative: 0 %
HCT: 29.4 % — ABNORMAL LOW (ref 35.0–47.0)
HCT: 31.1 % — ABNORMAL LOW (ref 35.0–47.0)
HEMOGLOBIN: 10.6 g/dL — AB (ref 12.0–16.0)
Hemoglobin: 10.3 g/dL — ABNORMAL LOW (ref 12.0–16.0)
LYMPHS ABS: 0.5 10*3/uL — AB (ref 1.0–3.6)
LYMPHS PCT: 4 %
Lymphocytes Relative: 4 %
Lymphs Abs: 0.4 10*3/uL — ABNORMAL LOW (ref 1.0–3.6)
MCH: 32.5 pg (ref 26.0–34.0)
MCH: 32 pg (ref 26.0–34.0)
MCHC: 34.9 g/dL (ref 32.0–36.0)
MCHC: 34.3 g/dL (ref 32.0–36.0)
MCV: 93.2 fL (ref 80.0–100.0)
MCV: 93.4 fL (ref 80.0–100.0)
MONO ABS: 1 10*3/uL — AB (ref 0.2–0.9)
MONOS PCT: 9 %
MONOS PCT: 9 %
Monocytes Absolute: 1 10*3/uL — ABNORMAL HIGH (ref 0.2–0.9)
NEUTROS PCT: 86 %
Neutro Abs: 9.5 10*3/uL — ABNORMAL HIGH (ref 1.4–6.5)
Neutro Abs: 9.9 10*3/uL — ABNORMAL HIGH (ref 1.4–6.5)
Neutrophils Relative %: 87 %
PLATELETS: 314 10*3/uL (ref 150–440)
Platelets: 282 10*3/uL (ref 150–440)
RBC: 3.15 MIL/uL — ABNORMAL LOW (ref 3.80–5.20)
RBC: 3.33 MIL/uL — ABNORMAL LOW (ref 3.80–5.20)
RDW: 17.1 % — AB (ref 11.5–14.5)
RDW: 16.8 % — ABNORMAL HIGH (ref 11.5–14.5)
WBC: 10.9 10*3/uL (ref 3.6–11.0)
WBC: 11.4 10*3/uL — ABNORMAL HIGH (ref 3.6–11.0)

## 2016-01-21 LAB — GLUCOSE, CAPILLARY
GLUCOSE-CAPILLARY: 236 mg/dL — AB (ref 65–99)
Glucose-Capillary: 491 mg/dL — ABNORMAL HIGH (ref 65–99)
Glucose-Capillary: 374 mg/dL — ABNORMAL HIGH (ref 65–99)

## 2016-01-21 MED ORDER — HEPARIN SOD (PORK) LOCK FLUSH 100 UNIT/ML IV SOLN
INTRAVENOUS | Status: AC
  Filled 2016-01-21: qty 5

## 2016-01-21 MED ORDER — INSULIN ASPART 100 UNIT/ML ~~LOC~~ SOLN
6.0000 [IU] | Freq: Once | SUBCUTANEOUS | Status: AC
  Administered 2016-01-21: 6 [IU] via INTRAVENOUS
  Filled 2016-01-21: qty 6

## 2016-01-21 MED ORDER — SODIUM CHLORIDE 0.9 % IV BOLUS (SEPSIS)
1000.0000 mL | Freq: Once | INTRAVENOUS | Status: AC
  Administered 2016-01-21: 1000 mL via INTRAVENOUS

## 2016-01-21 MED ORDER — ONDANSETRON HCL 4 MG/2ML IJ SOLN
4.0000 mg | Freq: Once | INTRAMUSCULAR | Status: AC
  Administered 2016-01-21: 4 mg via INTRAVENOUS
  Filled 2016-01-21: qty 2

## 2016-01-21 MED ORDER — PENTAFLUOROPROP-TETRAFLUOROETH EX AERO
INHALATION_SPRAY | CUTANEOUS | Status: AC
  Filled 2016-01-21: qty 30

## 2016-01-21 MED ORDER — CEPHALEXIN 500 MG PO CAPS: 500.0000 mg | ORAL_CAPSULE | Freq: Three times a day (TID) | ORAL | 0 refills | Status: AC

## 2016-01-21 MED ORDER — CEPHALEXIN 500 MG PO CAPS
500.0000 mg | ORAL_CAPSULE | Freq: Once | ORAL | Status: AC
  Administered 2016-01-21: 500 mg via ORAL
  Filled 2016-01-21: qty 1

## 2016-01-21 NOTE — ED Notes (Signed)
Port flushed with heparin and deaccessed without difficulty

## 2016-01-21 NOTE — ED Provider Notes (Signed)
[facility] Emergency Department Provider Note  ____________________________________________   First MD Initiated Contact with Patient 01/21/16 1121     (approximate)  I have reviewed the triage vital signs and the nursing notes.   HISTORY  Chief Complaint Hyperglycemia and Weakness   HPI [name] [name] is a 75 y.o. female with a history of lung cancer who was sent to the emergency department today from the [place] because of a glucose greater than 500. The patient says that she has not been having fever. Says that she recently did start to have a runny nose and says that she also has a chronic cough. She says that she did "bringing up a lot of stuff" and Dr. Gary Fleet office prior to arrival to the emergency department. She says that she has ongoing nausea and vomiting ever since she has been on chemotherapy. She says that her last chemotherapy treatment was 3 weeks ago.   Past Medical History:  Diagnosis Date  . Abnormal CT lung screening 10/17/2015  . COPD (chronic obstructive pulmonary disease) ([place])   . Coronary artery disease, non-occlusive    a. cath 2006: min nonobs CAD; b. cath 12/2010: cath LAD 50%, RCA 60%; c. 08/2013: Minimal luminal irregs, right dominant system with no significant CAD, diffuse luminal irregs noted. Normal EF 55%, no AS or MS.   . Diabetes mellitus   . Hyperlipemia    Followed by Dr. Rosanna Randy  . Hypertension   . Lung cancer ([place])   . Macular degeneration    rt  . Personal history of tobacco use, presenting hazards to health 10/15/2015  . Pneumonia    hx  . Shortness of breath   . Stroke [facility])     Patient Active Problem List   Diagnosis Date Noted  . Primary cancer of right upper lobe of lung ([place]) 11/20/2015  . Adenopathy   . Abnormal CT lung screening 10/17/2015  . Personal history of tobacco use, presenting hazards to health 10/15/2015  . NSTEMI (non-ST elevated myocardial infarction) ([place]) 01/27/2015  .  Chronic vulvitis 09/26/2014  . Allergic reaction 09/26/2014  . Carotid stenosis 08/30/2014  . Cervical nerve root disorder 08/10/2014  . Absolute anemia 08/10/2014  . CAD in native artery 08/10/2014  . B12 deficiency 08/10/2014  . Back ache 08/10/2014  . Bronchitis, chronic ([place]) 08/10/2014  . Diabetes mellitus with polyneuropathy ([place]) 08/10/2014  . Can't get food down 08/10/2014  . Eczema of external ear 08/10/2014  . Accumulation of fluid in tissues 08/10/2014  . Gout 08/10/2014  . Adult hypothyroidism 08/10/2014  . Mononeuritis 08/10/2014  . Muscle ache 08/10/2014  . Disorder of peripheral nervous system ([place]) 08/10/2014  . Lesion of vulva 08/10/2014  . Cervical spondylosis with radiculopathy 10/19/2013  . Unstable angina ([place]) 08/15/2013  . COPD exacerbation ([place]) 03/29/2013  . CAD (coronary artery disease) 06/22/2011  . COPD (chronic obstructive pulmonary disease) with emphysema ([place]) 03/07/2010  . CHEST PAIN UNSPECIFIED 07/22/2009  . HLD (hyperlipidemia) 04/01/2009  . Malaise and fatigue 04/01/2009  . Hyperlipidemia 01/18/2009  . TOBACCO ABUSE 01/18/2009  . HYPERTENSION, BENIGN 01/18/2009  . CLAUDICATION 01/18/2009  . Pain in limb 01/18/2009  . CAFL (chronic airflow limitation) ([place]) 01/20/2007  . Late effects of cerebrovascular disease 01/10/2007  . Essential (primary) hypertension 12/22/2006    Past Surgical History:  Procedure Laterality Date  . ABDOMINAL HYSTERECTOMY    . ANTERIOR CERVICAL DECOMP/DISCECTOMY FUSION N/A 10/19/2013   Procedure: CERVICAL FIVE-SIX ANTERIOR CERVICAL DECOMPRESSION WITH FUSION  INTERBODY PROSTHESIS PLATING AND PEEK CAGE;  Surgeon: Ophelia Charter, MD;  Location: [place] [facility]UR[facility] ORS;  Service: Neurosurgery;  Laterality: N/A;  . BACK SURGERY  80's  . BREAST CYST EXCISION Left    left negative   . CARDIAC CATHETERIZATION  05/2004  . CATARACT EXTRACTION Left   . CHOLECYSTECTOMY    . ENDARTERECTOMY Left 10/24/2014   Procedure: ENDARTERECTOMY  CAROTID;  Surgeon: Algernon Huxley, MD;  Location: [facility] ORS;  Service: Vascular;  Laterality: Left;  . ENDOBRONCHIAL ULTRASOUND N/A 11/14/2015   Procedure: ENDOBRONCHIAL ULTRASOUND;  Surgeon: [name], MD;  Location: [facility] ORS;  Service: Cardiopulmonary;  Laterality: N/A;  . PERIPHERAL VASCULAR CATHETERIZATION N/A 12/04/2015   Procedure: Glori Luis Cath Insertion;  Surgeon: Algernon Huxley, MD;  Location: [place] CV LAB;  Service: Cardiovascular;  Laterality: N/A;  . TONSILLECTOMY AND ADENOIDECTOMY    . VESICOVAGINAL FISTULA CLOSURE W/ TAH      Prior to Admission medications   Medication Sig Start Date End Date Taking? Authorizing Provider  albuterol (PROVENTIL) (5 MG/ML) 0.5% nebulizer solution Take 2.5 mg by nebulization 2 (two) times daily.   Yes Historical Provider, MD  aspirin 81 MG tablet Take 81 mg by mouth every morning.    Yes Historical Provider, MD  atorvastatin (LIPITOR) 20 MG tablet Take 1 tablet by mouth at  bedtime 09/04/15  Yes Richard Maceo Pro., MD  budesonide-formoterol [facility]) 160-4.5 MCG/ACT inhaler Inhale 2 puffs into the lungs 2 (two) times daily. 12/06/14  Yes [name], MD  clopidogrel (PLAVIX) 75 MG tablet Take 75 mg by mouth once.  11/23/15  Yes Historical Provider, MD  diazepam (VALIUM) 5 MG tablet Take 1 tablet (5 mg total) by mouth every 6 (six) hours as needed for muscle spasms. 10/20/13  Yes Newman Pies, MD  gabapentin (NEURONTIN) 600 MG tablet Take 1 tablet by mouth two  times daily 06/06/15  Yes Richard Maceo Pro., MD  glimepiride (AMARYL) 4 MG tablet Take 1 tablet by mouth two  times daily 04/18/15  Yes Richard Maceo Pro., MD  isosorbide mononitrate (IMDUR) 30 MG 24 hr tablet Take 1 tablet by mouth  daily 09/04/15  Yes [name], MD  lidocaine-prilocaine (EMLA) cream Apply to port 1-2 hours prior to chemotherapy appointment. Cover with plastic wrap. 12/05/15  Yes Lloyd Huger, MD  losartan-hydrochlorothiazide [facility]) 100-25 MG tablet Take 1  tablet by mouth  daily 09/04/15  Yes Richard Maceo Pro., MD  magnesium oxide (MAG-OX) 400 (241.3 MG) MG tablet Take 1 tablet by mouth 2 (two) times daily. 08/04/13  Yes Historical Provider, MD  metFORMIN (GLUCOPHAGE) 1000 MG tablet Take 1 tablet by mouth  every day 10/10/15  Yes Richard Maceo Pro., MD  Multiple Vitamins-Minerals (ICAPS) CAPS Take 2 capsules by mouth daily. Reported on 04/17/2015   Yes Historical Provider, MD  naproxen sodium (ANAPROX) 220 MG tablet Take 220 mg by mouth as needed. Takes twice a week as needed.   Yes Historical Provider, MD  nitroGLYCERIN (NITROSTAT) 0.4 MG SL tablet Place 1 tablet (0.4 mg total) under the tongue every 5 (five) minutes as needed for chest pain. 07/23/14  Yes [name], MD  ondansetron (ZOFRAN) 8 MG tablet Take 1 tablet (8 mg total) by mouth 2 (two) times daily as needed. 12/05/15  Yes Lloyd Huger, MD  oxymetazoline (NO DRIP NASAL SPRAY) 0.05 % nasal spray Place 1 spray into both nostrils 2 (two) times daily.   Yes Historical  Provider, MD  pantoprazole (PROTONIX) 40 MG tablet Take 1 tablet by mouth  daily 09/04/15  Yes Richard Maceo Pro., MD  pioglitazone (ACTOS) 30 MG tablet Take 1 tablet (30 mg total) by mouth daily. 05/06/15  Yes Richard Maceo Pro., MD  potassium chloride (K-DUR) 10 MEQ tablet Take 3 tablets by mouth  daily as directed 01/11/15  Yes Richard Maceo Pro., MD  prochlorperazine (COMPAZINE) 10 MG tablet Take 1 tablet (10 mg total) by mouth every 6 (six) hours as needed (Nausea or vomiting). 12/05/15  Yes Lloyd Huger, MD  cephALEXin (KEFLEX) 500 MG capsule Take 1 capsule (500 mg total) by mouth 3 (three) times daily. 01/21/16 01/31/16  Orbie Pyo, MD    Allergies Coconut fatty acids  Family History  Problem Relation Age of Onset  . Stroke Mother     Massive  . Heart attack Father     Massive  . Heart attack Brother   . Heart attack Brother   . Lung cancer Maternal Grandfather   . Heart attack  Paternal Grandmother     MI    Social History Social History  Substance Use Topics  . Smoking status: Former Smoker    Packs/day: 1.00    Years: 50.00    Types: Cigarettes    Quit date: 10/03/2015  . Smokeless tobacco: Never Used     Comment: smokes 3 cigs daily 05/06/15. Pt instructed to quit.  . Alcohol use No    Review of Systems Constitutional: No fever/chills Eyes: No visual changes. ENT: No sore throat. Cardiovascular: Denies chest pain. Respiratory: Denies shortness of breath. Gastrointestinal: No abdominal pain.  No diarrhea.  No constipation. Genitourinary: Negative for dysuria. Musculoskeletal: Negative for back pain. Skin: Negative for rash. Neurological: Negative for headaches, focal weakness or numbness.  10-point ROS otherwise negative.  ____________________________________________   PHYSICAL EXAM:  VITAL SIGNS: ED Triage Vitals  Enc Vitals Group     BP 01/21/16 1054 121/84     Pulse Rate 01/21/16 1054 89     Resp 01/21/16 1054 18     Temp 01/21/16 1054 97.4 F (36.3 C)     Temp Source 01/21/16 1054 Oral     SpO2 01/21/16 1054 100 %     Weight 01/21/16 1055 152 lb (68.9 kg)     Height --      Head Circumference --      Peak Flow --      Pain Score 01/21/16 1055 9     Pain Loc --      Pain Edu? --      Excl. in [place]? --     Constitutional: Alert and oriented. Well appearing and in no acute distress. Eyes: Conjunctivae are normal. PERRL. EOMI. Head: Atraumatic. Nose: No congestion/rhinnorhea. Mouth/Throat: Mucous membranes are moist.  Neck: No stridor.   Cardiovascular: Normal rate, regular rhythm. Grossly normal heart sounds.   Respiratory: Normal respiratory effort.  No retractions. Lungs CTAB. Gastrointestinal: Soft and nontender. No distention. no CVA tenderness. Musculoskeletal: No lower extremity tenderness nor edema.  No joint effusions. Neurologic:  Normal speech and language. No gross focal neurologic deficits are appreciated. No gait  instability. Skin:  Skin is warm, dry and intact. No rash noted. Psychiatric: Mood and affect are normal. Speech and behavior are normal.  ____________________________________________   LABS (all labs ordered are listed, but only abnormal results are displayed)  Labs Reviewed  CBC WITH DIFFERENTIAL/PLATELET - Abnormal; Notable for the following:  Result Value   WBC 11.4 (*)    RBC 3.33 (*)    Hemoglobin 10.6 (*)    HCT 31.1 (*)    RDW 16.8 (*)    Neutro Abs 9.9 (*)    Lymphs Abs 0.5 (*)    Monocytes Absolute 1.0 (*)    All other components within normal limits  BASIC METABOLIC PANEL - Abnormal; Notable for the following:    Sodium 127 (*)    Potassium 6.4 (*)    Chloride 94 (*)    CO2 21 (*)    Glucose, Bld 467 (*)    BUN 28 (*)    Creatinine, Ser 1.19 (*)    Calcium 8.8 (*)    GFR calc non Af Amer 44 (*)    GFR calc Af Amer 51 (*)    All other components within normal limits  URINALYSIS COMPLETEWITH MICROSCOPIC ([facility] ONLY) - Abnormal; Notable for the following:    Color, Urine STRAW (*)    APPearance HAZY (*)    Glucose, UA >500 (*)    Ketones, ur 1+ (*)    Hgb urine dipstick 1+ (*)    Leukocytes, UA TRACE (*)    Bacteria, UA RARE (*)    Squamous Epithelial / LPF 0-5 (*)    All other components within normal limits  GLUCOSE, CAPILLARY - Abnormal; Notable for the following:    Glucose-Capillary 491 (*)    All other components within normal limits  GLUCOSE, CAPILLARY - Abnormal; Notable for the following:    Glucose-Capillary 374 (*)    All other components within normal limits  BASIC METABOLIC PANEL - Abnormal; Notable for the following:    Sodium 132 (*)    Potassium 5.2 (*)    Glucose, Bld 333 (*)    BUN 26 (*)    Creatinine, Ser 1.02 (*)    Calcium 8.1 (*)    GFR calc non Af Amer 53 (*)    All other components within normal limits  URINE CULTURE    ____________________________________________  EKG   ____________________________________________  RADIOLOGY  DG Chest 2 View (Accession [id number]) (Order [id number])  Imaging  Date: 01/21/2016 Department: [facility] EMERGENCY DEPARTMENT Released By/Authorizing: Orbie Pyo, MD (auto-released)  Exam Information   Status Exam Begun  Exam Ended   Final [99] 01/21/2016 12:06 PM 01/21/2016 12:08 PM  PACS Images   Show images for DG Chest 2 View  Study Result   CLINICAL DATA:  Chronic cough and shortness of breath. Stage III small cell right upper lobe lung cancer.  EXAM: CHEST  2 VIEW  COMPARISON:  PET-CT 10/23/2015. Chest CT 10/15/2015. Chest radiographs 01/27/2015.  FINDINGS: Left jugular Port-A-Cath terminates over the lower SVC. The cardiomediastinal silhouette is within normal limits. Right upper lobe nodule on PET-CT is not well seen on these radiographs. No airspace consolidation, edema, pleural effusion, or pneumothorax is identified. Right upper quadrant abdominal surgical clips and prior ACDF are noted. No acute osseous abnormality is seen.  IMPRESSION: No evidence of acute cardiopulmonary process.   Electronically Signed   By: Logan Bores M.D.   On: 01/21/2016 12:14     ____________________________________________   PROCEDURES  Procedure(s) performed:   Procedures  Critical Care performed:   ____________________________________________   INITIAL IMPRESSION / ASSESSMENT AND PLAN / ED COURSE  Pertinent labs & imaging results that were available during my care of the patient were reviewed by me and considered in my medical decision making (see chart for  details).  ----------------------------------------- 345 PM on 01/21/2016 -----------------------------------------  Patient pending repeat BMP at this time. I discussed with the patient her borderline UA and she says that she does have burning with  urination. Because she is on chemotherapy and has a slightly elevated white blood cell count I will be treating her for UTI with Keflex. Signed out to Dr. Alfred Levins.    Clinical Course     ____________________________________________   FINAL CLINICAL IMPRESSION(S) / ED DIAGNOSES  Final diagnoses:  Urinary tract infection with hematuria, site unspecified  Hyperglycemia      NEW MEDICATIONS STARTED DURING THIS VISIT:  New Prescriptions   CEPHALEXIN (KEFLEX) 500 MG CAPSULE    Take 1 capsule (500 mg total) by mouth 3 (three) times daily.     Note:  This document was prepared using Dragon voice recognition software and may include unintentional dictation errors.    Orbie Pyo, MD 01/21/16 [phone]

## 2016-01-21 NOTE — ED Provider Notes (Signed)
-----------------------------------------   5:47 PM on 01/21/2016 -----------------------------------------   Blood pressure (!) 116/58, pulse 75, temperature 97.4 F (36.3 C), temperature source Oral, resp. rate 11, weight 152 lb (68.9 kg), SpO2 100 %.  Assuming care from Dr. Clearnce Hasten of [name] [name] is a 75 y.o. female with a chief complaint of Hyperglycemia and Weakness .    In summary, 75 year old female with a history of lung cancer currently undergoing chemotherapy who was sent here from chemotherapy for elevated glucose. Initial blood glucose was 491. K was 6.4 however hemolyze. Patient received IV fluids with repeat K 5.2 and BG 333. I then gave her 6U IV insulin as patient is on metformin only and repeat BG 236. Will dc home. Patient was told to call her chemotherapy clinic to reschedule her appointment. Patient will be discharged home on Keflex for UTI and with discharge instructions left by Dr. Dineen Kid.   Rudene Re, MD 01/21/16 1750

## 2016-01-21 NOTE — ED Notes (Signed)
Pt assessed upon RN assigned. Pt NAD at time of assessment. Pt's skin is dry and pt has no other s/s of hyperglycemia. Pt's CBG checked an is 491.

## 2016-01-21 NOTE — ED Triage Notes (Signed)
Pt to ed with c/o hyperglycemia today at cancer center.  Pt states they would not give chemo to her today.  Pt reports weakness and decreased energy.

## 2016-01-21 NOTE — Progress Notes (Signed)
States has been nauseated with vomiting for past 2 weeks. Unable to keep anything down. Requests different antiemetic that dissolves in mouth since cannot keep pills down at this time.

## 2016-01-22 ENCOUNTER — Inpatient Hospital Stay: Payer: Medicare Other

## 2016-01-22 ENCOUNTER — Ambulatory Visit
Admission: RE | Admit: 2016-01-22 | Discharge: 2016-01-22 | Disposition: A | Discharge status: Home / Self Care | Payer: Medicare Other | Source: Ambulatory Visit | Attending: Radiation Oncology | Admitting: Radiation Oncology

## 2016-01-22 DIAGNOSIS — Z7982 Long term (current) use of aspirin: Secondary | ICD-10-CM | POA: Diagnosis not present

## 2016-01-22 DIAGNOSIS — C3411 Malignant neoplasm of upper lobe, right bronchus or lung: Secondary | ICD-10-CM | POA: Diagnosis not present

## 2016-01-22 DIAGNOSIS — I2510 Atherosclerotic heart disease of native coronary artery without angina pectoris: Secondary | ICD-10-CM | POA: Diagnosis not present

## 2016-01-22 DIAGNOSIS — E785 Hyperlipidemia, unspecified: Secondary | ICD-10-CM | POA: Diagnosis not present

## 2016-01-22 DIAGNOSIS — J449 Chronic obstructive pulmonary disease, unspecified: Secondary | ICD-10-CM | POA: Diagnosis not present

## 2016-01-22 DIAGNOSIS — E119 Type 2 diabetes mellitus without complications: Secondary | ICD-10-CM | POA: Diagnosis not present

## 2016-01-22 DIAGNOSIS — Z801 Family history of malignant neoplasm of trachea, bronchus and lung: Secondary | ICD-10-CM | POA: Diagnosis not present

## 2016-01-22 DIAGNOSIS — I10 Essential (primary) hypertension: Secondary | ICD-10-CM | POA: Diagnosis not present

## 2016-01-22 DIAGNOSIS — Z7984 Long term (current) use of oral hypoglycemic drugs: Secondary | ICD-10-CM | POA: Diagnosis not present

## 2016-01-22 DIAGNOSIS — Z79899 Other long term (current) drug therapy: Secondary | ICD-10-CM | POA: Diagnosis not present

## 2016-01-22 DIAGNOSIS — Z8673 Personal history of transient ischemic attack (TIA), and cerebral infarction without residual deficits: Secondary | ICD-10-CM | POA: Diagnosis not present

## 2016-01-22 DIAGNOSIS — Z510 Encounter for antineoplastic radiation therapy: Secondary | ICD-10-CM | POA: Diagnosis not present

## 2016-01-22 DIAGNOSIS — Z87891 Personal history of nicotine dependence: Secondary | ICD-10-CM | POA: Diagnosis not present

## 2016-01-22 DIAGNOSIS — R0602 Shortness of breath: Secondary | ICD-10-CM | POA: Diagnosis not present

## 2016-01-23 ENCOUNTER — Encounter: Payer: Self-pay | Admitting: Family Medicine

## 2016-01-23 ENCOUNTER — Ambulatory Visit
Admission: RE | Admit: 2016-01-23 | Discharge: 2016-01-23 | Disposition: A | Discharge status: Home / Self Care | Payer: Medicare Other | Source: Ambulatory Visit | Attending: Radiation Oncology | Admitting: Radiation Oncology

## 2016-01-23 ENCOUNTER — Inpatient Hospital Stay: Payer: Medicare Other

## 2016-01-23 DIAGNOSIS — Z8673 Personal history of transient ischemic attack (TIA), and cerebral infarction without residual deficits: Secondary | ICD-10-CM | POA: Diagnosis not present

## 2016-01-23 DIAGNOSIS — J449 Chronic obstructive pulmonary disease, unspecified: Secondary | ICD-10-CM | POA: Diagnosis not present

## 2016-01-23 DIAGNOSIS — Z87891 Personal history of nicotine dependence: Secondary | ICD-10-CM | POA: Diagnosis not present

## 2016-01-23 DIAGNOSIS — Z510 Encounter for antineoplastic radiation therapy: Secondary | ICD-10-CM | POA: Diagnosis not present

## 2016-01-23 DIAGNOSIS — Z801 Family history of malignant neoplasm of trachea, bronchus and lung: Secondary | ICD-10-CM | POA: Diagnosis not present

## 2016-01-23 DIAGNOSIS — R0602 Shortness of breath: Secondary | ICD-10-CM | POA: Diagnosis not present

## 2016-01-23 DIAGNOSIS — I10 Essential (primary) hypertension: Secondary | ICD-10-CM | POA: Diagnosis not present

## 2016-01-23 DIAGNOSIS — E785 Hyperlipidemia, unspecified: Secondary | ICD-10-CM | POA: Diagnosis not present

## 2016-01-23 DIAGNOSIS — E119 Type 2 diabetes mellitus without complications: Secondary | ICD-10-CM | POA: Diagnosis not present

## 2016-01-23 DIAGNOSIS — Z7982 Long term (current) use of aspirin: Secondary | ICD-10-CM | POA: Diagnosis not present

## 2016-01-23 DIAGNOSIS — I2510 Atherosclerotic heart disease of native coronary artery without angina pectoris: Secondary | ICD-10-CM | POA: Diagnosis not present

## 2016-01-23 DIAGNOSIS — Z7984 Long term (current) use of oral hypoglycemic drugs: Secondary | ICD-10-CM | POA: Diagnosis not present

## 2016-01-23 DIAGNOSIS — C3411 Malignant neoplasm of upper lobe, right bronchus or lung: Secondary | ICD-10-CM | POA: Diagnosis not present

## 2016-01-23 DIAGNOSIS — Z79899 Other long term (current) drug therapy: Secondary | ICD-10-CM | POA: Diagnosis not present

## 2016-01-23 LAB — URINE CULTURE: Culture: >=100000 — AB

## 2016-01-24 ENCOUNTER — Ambulatory Visit
Admission: RE | Admit: 2016-01-24 | Discharge: 2016-01-24 | Disposition: A | Discharge status: Home / Self Care | Payer: Medicare Other | Source: Ambulatory Visit | Attending: Radiation Oncology | Admitting: Radiation Oncology

## 2016-01-24 ENCOUNTER — Ambulatory Visit: Payer: Medicare Other

## 2016-01-24 DIAGNOSIS — C3411 Malignant neoplasm of upper lobe, right bronchus or lung: Secondary | ICD-10-CM | POA: Diagnosis not present

## 2016-01-24 DIAGNOSIS — Z87891 Personal history of nicotine dependence: Secondary | ICD-10-CM | POA: Diagnosis not present

## 2016-01-24 DIAGNOSIS — I2510 Atherosclerotic heart disease of native coronary artery without angina pectoris: Secondary | ICD-10-CM | POA: Diagnosis not present

## 2016-01-24 DIAGNOSIS — J449 Chronic obstructive pulmonary disease, unspecified: Secondary | ICD-10-CM | POA: Diagnosis not present

## 2016-01-24 DIAGNOSIS — E119 Type 2 diabetes mellitus without complications: Secondary | ICD-10-CM | POA: Diagnosis not present

## 2016-01-24 DIAGNOSIS — Z801 Family history of malignant neoplasm of trachea, bronchus and lung: Secondary | ICD-10-CM | POA: Diagnosis not present

## 2016-01-24 DIAGNOSIS — Z7984 Long term (current) use of oral hypoglycemic drugs: Secondary | ICD-10-CM | POA: Diagnosis not present

## 2016-01-24 DIAGNOSIS — Z79899 Other long term (current) drug therapy: Secondary | ICD-10-CM | POA: Diagnosis not present

## 2016-01-24 DIAGNOSIS — Z7982 Long term (current) use of aspirin: Secondary | ICD-10-CM | POA: Diagnosis not present

## 2016-01-24 DIAGNOSIS — Z8673 Personal history of transient ischemic attack (TIA), and cerebral infarction without residual deficits: Secondary | ICD-10-CM | POA: Diagnosis not present

## 2016-01-24 DIAGNOSIS — E785 Hyperlipidemia, unspecified: Secondary | ICD-10-CM | POA: Diagnosis not present

## 2016-01-24 DIAGNOSIS — Z510 Encounter for antineoplastic radiation therapy: Secondary | ICD-10-CM | POA: Diagnosis not present

## 2016-01-24 DIAGNOSIS — I10 Essential (primary) hypertension: Secondary | ICD-10-CM | POA: Diagnosis not present

## 2016-01-24 DIAGNOSIS — R0602 Shortness of breath: Secondary | ICD-10-CM | POA: Diagnosis not present

## 2016-01-26 DIAGNOSIS — J449 Chronic obstructive pulmonary disease, unspecified: Secondary | ICD-10-CM | POA: Diagnosis not present

## 2016-01-27 ENCOUNTER — Ambulatory Visit
Admission: RE | Admit: 2016-01-27 | Discharge: 2016-01-27 | Disposition: A | Discharge status: Home / Self Care | Payer: Medicare Other | Source: Ambulatory Visit | Attending: Radiation Oncology | Admitting: Radiation Oncology

## 2016-01-27 DIAGNOSIS — Z8673 Personal history of transient ischemic attack (TIA), and cerebral infarction without residual deficits: Secondary | ICD-10-CM | POA: Diagnosis not present

## 2016-01-27 DIAGNOSIS — Z801 Family history of malignant neoplasm of trachea, bronchus and lung: Secondary | ICD-10-CM | POA: Diagnosis not present

## 2016-01-27 DIAGNOSIS — Z87891 Personal history of nicotine dependence: Secondary | ICD-10-CM | POA: Diagnosis not present

## 2016-01-27 DIAGNOSIS — E785 Hyperlipidemia, unspecified: Secondary | ICD-10-CM | POA: Diagnosis not present

## 2016-01-27 DIAGNOSIS — J449 Chronic obstructive pulmonary disease, unspecified: Secondary | ICD-10-CM | POA: Diagnosis not present

## 2016-01-27 DIAGNOSIS — E119 Type 2 diabetes mellitus without complications: Secondary | ICD-10-CM | POA: Diagnosis not present

## 2016-01-27 DIAGNOSIS — I2510 Atherosclerotic heart disease of native coronary artery without angina pectoris: Secondary | ICD-10-CM | POA: Diagnosis not present

## 2016-01-27 DIAGNOSIS — Z510 Encounter for antineoplastic radiation therapy: Secondary | ICD-10-CM | POA: Diagnosis not present

## 2016-01-27 DIAGNOSIS — Z7984 Long term (current) use of oral hypoglycemic drugs: Secondary | ICD-10-CM | POA: Diagnosis not present

## 2016-01-27 DIAGNOSIS — R0602 Shortness of breath: Secondary | ICD-10-CM | POA: Diagnosis not present

## 2016-01-27 DIAGNOSIS — Z7982 Long term (current) use of aspirin: Secondary | ICD-10-CM | POA: Diagnosis not present

## 2016-01-27 DIAGNOSIS — I10 Essential (primary) hypertension: Secondary | ICD-10-CM | POA: Diagnosis not present

## 2016-01-27 DIAGNOSIS — C3411 Malignant neoplasm of upper lobe, right bronchus or lung: Secondary | ICD-10-CM | POA: Diagnosis not present

## 2016-01-27 DIAGNOSIS — Z79899 Other long term (current) drug therapy: Secondary | ICD-10-CM | POA: Diagnosis not present

## 2016-01-27 NOTE — Progress Notes (Signed)
[place]  Telephone:(336) [phone] Fax:(336) [phone]  ID: [name] [name] OB: 1940-08-15  MR#: [id number]  JHE#:[id number]  Patient Care Team: Jerrol Banana., MD as PCP - General (Unknown Physician Specialty) [name], MD as Consulting Physician (Cardiology) Algernon Huxley, MD as Consulting Physician (Vascular Surgery)  CHIEF COMPLAINT: Clinical stage IIIa small cell lung carcinoma of the right upper lobe lung.  INTERVAL HISTORY: Patient returns to clinic today for further evaluation and reconsideration of cycle 3 of 4 of cisplatin and etoposide. Her blood sugars are improved and she is no longer having nausea or vomiting.  She continues to have a chronic cough and shortness of breath. She has no neurologic complaints. She denies any recent fevers or illnesses. She has a poor appetite but denies weight loss. She has no chest pain. She denies any constipation or diarrhea. She has no urinary complaints. Patient offers no further specific complaints today.  REVIEW OF SYSTEMS:   Review of Systems  Constitutional: Negative.  Negative for fever, malaise/fatigue and weight loss.  Respiratory: Positive for cough and shortness of breath.   Cardiovascular: Negative.  Negative for chest pain and leg swelling.  Gastrointestinal: Negative.  Negative for abdominal pain, nausea and vomiting.  Genitourinary: Negative.   Musculoskeletal: Negative.   Neurological: Negative.   Psychiatric/Behavioral: Negative.  The patient is not nervous/anxious.     As per HPI. Otherwise, a complete review of systems is negative.  PAST MEDICAL HISTORY: Past Medical History:  Diagnosis Date  . Abnormal CT lung screening 10/17/2015  . COPD (chronic obstructive pulmonary disease) ([place])   . Coronary artery disease, non-occlusive    a. cath 2006: min nonobs CAD; b. cath 12/2010: cath LAD 50%, RCA 60%; c. 08/2013: Minimal luminal irregs, right dominant system with no significant CAD, diffuse  luminal irregs noted. Normal EF 55%, no AS or MS.   . Diabetes mellitus   . Hyperlipemia    Followed by Dr. Rosanna Randy  . Hypertension   . Lung cancer ([place])   . Macular degeneration    rt  . Personal history of tobacco use, presenting hazards to health 10/15/2015  . Pneumonia    hx  . Shortness of breath   . Stroke [facility])     PAST SURGICAL HISTORY: Past Surgical History:  Procedure Laterality Date  . ABDOMINAL HYSTERECTOMY    . ANTERIOR CERVICAL DECOMP/DISCECTOMY FUSION N/A 10/19/2013   Procedure: CERVICAL FIVE-SIX ANTERIOR CERVICAL DECOMPRESSION WITH FUSION INTERBODY PROSTHESIS PLATING AND PEEK CAGE;  Surgeon: Ophelia Charter, MD;  Location: [place] NEURO ORS;  Service: Neurosurgery;  Laterality: N/A;  . BACK SURGERY  80's  . BREAST CYST EXCISION Left    left negative   . CARDIAC CATHETERIZATION  05/2004  . CATARACT EXTRACTION Left   . CHOLECYSTECTOMY    . ENDARTERECTOMY Left 10/24/2014   Procedure: ENDARTERECTOMY CAROTID;  Surgeon: Algernon Huxley, MD;  Location: [facility] ORS;  Service: Vascular;  Laterality: Left;  . ENDOBRONCHIAL ULTRASOUND N/A 11/14/2015   Procedure: ENDOBRONCHIAL ULTRASOUND;  Surgeon: [name], MD;  Location: [facility] ORS;  Service: Cardiopulmonary;  Laterality: N/A;  . PERIPHERAL VASCULAR CATHETERIZATION N/A 12/04/2015   Procedure: Glori Luis Cath Insertion;  Surgeon: Algernon Huxley, MD;  Location: [place] CV LAB;  Service: Cardiovascular;  Laterality: N/A;  . TONSILLECTOMY AND ADENOIDECTOMY    . VESICOVAGINAL FISTULA CLOSURE W/ TAH      FAMILY HISTORY: Family History  Problem Relation Age of Onset  . Stroke Mother  Massive  . Heart attack Father     Massive  . Heart attack Brother   . Heart attack Brother   . Lung cancer Maternal Grandfather   . Heart attack Paternal Grandmother     MI       ADVANCED DIRECTIVES (Y/N):  N   HEALTH MAINTENANCE: Social History  Substance Use Topics  . Smoking status: Former Smoker    Packs/day: 1.00    Years: 50.00     Types: Cigarettes    Quit date: 10/03/2015  . Smokeless tobacco: Never Used     Comment: smokes 3 cigs daily 05/06/15. Pt instructed to quit.  . Alcohol use No     Colonoscopy:  PAP:  Bone density:  Lipid panel:  Allergies  Allergen Reactions  . Coconut Fatty Acids Swelling    Throat swells    Current Outpatient Prescriptions  Medication Sig Dispense Refill  . albuterol (PROVENTIL) (5 MG/ML) 0.5% nebulizer solution Take 2.5 mg by nebulization 2 (two) times daily.    Marland Kitchen aspirin 81 MG tablet Take 81 mg by mouth every morning.     Marland Kitchen atorvastatin (LIPITOR) 20 MG tablet Take 1 tablet by mouth at  bedtime 90 tablet 3  . budesonide-formoterol (SYMBICORT) 160-4.5 MCG/ACT inhaler Inhale 2 puffs into the lungs 2 (two) times daily. 1 Inhaler 0  . clopidogrel (PLAVIX) 75 MG tablet Take 75 mg by mouth once.     . diazepam (VALIUM) 5 MG tablet Take 1 tablet (5 mg total) by mouth every 6 (six) hours as needed for muscle spasms. 50 tablet 1  . gabapentin (NEURONTIN) 600 MG tablet Take 1 tablet by mouth two  times daily 180 tablet 3  . glimepiride (AMARYL) 4 MG tablet Take 1 tablet by mouth two  times daily 180 tablet 3  . isosorbide mononitrate (IMDUR) 30 MG 24 hr tablet Take 1 tablet by mouth  daily 90 tablet 3  . lidocaine-prilocaine (EMLA) cream Apply to port 1-2 hours prior to chemotherapy appointment. Cover with plastic wrap. 90 g 3  . losartan-hydrochlorothiazide (HYZAAR) 100-25 MG tablet Take 1 tablet by mouth  daily 90 tablet 3  . magnesium oxide (MAG-OX) 400 (241.3 MG) MG tablet Take 1 tablet by mouth 2 (two) times daily.    . metFORMIN (GLUCOPHAGE) 1000 MG tablet Take 1 tablet by mouth  every day 90 tablet 3  . Multiple Vitamins-Minerals (ICAPS) CAPS Take 2 capsules by mouth daily. Reported on 04/17/2015    . naproxen sodium (ANAPROX) 220 MG tablet Take 220 mg by mouth as needed. Takes twice a week as needed.    . nitroGLYCERIN (NITROSTAT) 0.4 MG SL tablet Place 1 tablet (0.4 mg total) under  the tongue every 5 (five) minutes as needed for chest pain. 25 tablet 3  . ondansetron (ZOFRAN) 8 MG tablet Take 1 tablet (8 mg total) by mouth 2 (two) times daily as needed. 30 tablet 1  . oxymetazoline (NO DRIP NASAL SPRAY) 0.05 % nasal spray Place 1 spray into both nostrils 2 (two) times daily.    . pantoprazole (PROTONIX) 40 MG tablet Take 1 tablet by mouth  daily 90 tablet 3  . pioglitazone (ACTOS) 30 MG tablet Take 1 tablet (30 mg total) by mouth daily. 90 tablet 3  . potassium chloride (K-DUR) 10 MEQ tablet Take 3 tablets by mouth  daily as directed 270 tablet 1  . prochlorperazine (COMPAZINE) 10 MG tablet Take 1 tablet (10 mg total) by mouth every 6 (six) hours as  needed (Nausea or vomiting). 30 tablet 1   No current facility-administered medications for this visit.    Facility-Administered Medications Ordered in Other Visits  Medication Dose Route Frequency Provider Last Rate Last Dose  . ondansetron (ZOFRAN) 8 mg, dexamethasone (DECADRON) 10 mg in sodium chloride 0.9 % 50 mL IVPB   Intravenous Once Lloyd Huger, MD        OBJECTIVE: Vitals:   01/28/16 1003  BP: (!) 99/49  Pulse: 92  Resp: 18  Temp: 97.1 F (36.2 C)     Body mass index is 26.64 kg/m.    ECOG FS:0 - Asymptomatic  General: Well-developed, well-nourished, no acute distress. Eyes: Pink conjunctiva, anicteric sclera. Lungs: Clear to auscultation bilaterally. Heart: Regular rate and rhythm. No rubs, murmurs, or gallops. Abdomen: Soft, nontender, nondistended. No organomegaly noted, normoactive bowel sounds. Musculoskeletal: No edema, cyanosis, or clubbing. Neuro: Alert, answering all questions appropriately. Cranial nerves grossly intact. Skin: No rashes or petechiae noted. Psych: Normal affect.   LAB RESULTS:  Lab Results  Component Value Date   NA 129 (L) 01/28/2016   K 5.0 01/28/2016   CL 91 (L) 01/28/2016   CO2 27 01/28/2016   GLUCOSE 373 (H) 01/28/2016   BUN 34 (H) 01/28/2016   CREATININE  1.22 (H) 01/28/2016   CALCIUM 9.1 01/28/2016   PROT 6.5 01/28/2016   ALBUMIN 3.5 01/28/2016   AST 17 01/28/2016   ALT 18 01/28/2016   ALKPHOS 89 01/28/2016   BILITOT 0.6 01/28/2016   GFRNONAA 43 (L) 01/28/2016   GFRAA 50 (L) 01/28/2016    Lab Results  Component Value Date   WBC 8.0 01/28/2016   NEUTROABS 6.6 (H) 01/28/2016   HGB 9.3 (L) 01/28/2016   HCT 27.3 (L) 01/28/2016   MCV 95.4 01/28/2016   PLT 180 01/28/2016     STUDIES: Dg Chest 2 View  Result Date: 01/21/2016 CLINICAL DATA:  Chronic cough and shortness of breath. Stage III small cell right upper lobe lung cancer. EXAM: CHEST  2 VIEW COMPARISON:  PET-CT 10/23/2015. Chest CT 10/15/2015. Chest radiographs 01/27/2015. FINDINGS: Left jugular Port-A-Cath terminates over the lower SVC. The cardiomediastinal silhouette is within normal limits. Right upper lobe nodule on PET-CT is not well seen on these radiographs. No airspace consolidation, edema, pleural effusion, or pneumothorax is identified. Right upper quadrant abdominal surgical clips and prior ACDF are noted. No acute osseous abnormality is seen. IMPRESSION: No evidence of acute cardiopulmonary process. Electronically Signed   By: Logan Bores M.D.   On: 01/21/2016 12:14    ASSESSMENT: Clinical stage IIIa small cell lung carcinoma of the right upper lobe lung.  PLAN:    1. Clinical stage IIIa small cell lung carcinoma of the right upper lobe lung: CT and PET scan results reviewed independently concerning for stage IIIa disease given patient's 11 mm right paratracheal lymph node positive for small cell lung cancer. Patient has no other evidence of metastatic disease. MRI the brain is negative. Patient has not completed her XRT.  Proceed with cycle 3 today. Patient will receive cisplatin on day 1 and etoposide on days 1 and 2 and 3 today. Will add in Neulasta support if necessary at the conclusion of her XRT. Plan to reimage with PET scan after the conclusion of cycle 4.  Return to clinic in 3 weeks for consideration of cycle 4. 2. Cough: Continue OTC treatments as needed. 3. Hyperglycemia: Patient's blood sugars significantly elevated today of greater than 300.  4. Nausea and vomiting: Continue Zofran  and Compazine as needed.  5. Anemia: Mild, monitor.  Patient expressed understanding and was in agreement with this plan. She also understands that She can call clinic at any time with any questions, concerns, or complaints.   Primary cancer of right upper lobe of lung [facility])   Staging form: Lung, AJCC 7th Edition   - Clinical stage from 11/26/2015: Stage IIIA (T1a, N2, M0) - Signed by Lloyd Huger, MD on 11/26/2015  Lloyd Huger, MD   02/02/2016 8:04 AM

## 2016-01-28 ENCOUNTER — Inpatient Hospital Stay (HOSPITAL_BASED_OUTPATIENT_CLINIC_OR_DEPARTMENT_OTHER): Payer: Medicare Other | Admitting: Oncology

## 2016-01-28 ENCOUNTER — Inpatient Hospital Stay: Payer: Medicare Other

## 2016-01-28 VITALS — BP 99/49 | HR 92 | Temp 97.1°F | Resp 18 | Wt 155.2 lb

## 2016-01-28 DIAGNOSIS — J449 Chronic obstructive pulmonary disease, unspecified: Secondary | ICD-10-CM | POA: Diagnosis not present

## 2016-01-28 DIAGNOSIS — C3411 Malignant neoplasm of upper lobe, right bronchus or lung: Secondary | ICD-10-CM

## 2016-01-28 DIAGNOSIS — E1165 Type 2 diabetes mellitus with hyperglycemia: Secondary | ICD-10-CM | POA: Diagnosis not present

## 2016-01-28 DIAGNOSIS — D649 Anemia, unspecified: Secondary | ICD-10-CM | POA: Diagnosis not present

## 2016-01-28 DIAGNOSIS — R0602 Shortness of breath: Secondary | ICD-10-CM | POA: Diagnosis not present

## 2016-01-28 DIAGNOSIS — R630 Anorexia: Secondary | ICD-10-CM

## 2016-01-28 DIAGNOSIS — Z5111 Encounter for antineoplastic chemotherapy: Secondary | ICD-10-CM | POA: Diagnosis not present

## 2016-01-28 DIAGNOSIS — R05 Cough: Secondary | ICD-10-CM

## 2016-01-28 DIAGNOSIS — I2510 Atherosclerotic heart disease of native coronary artery without angina pectoris: Secondary | ICD-10-CM

## 2016-01-28 DIAGNOSIS — Z7984 Long term (current) use of oral hypoglycemic drugs: Secondary | ICD-10-CM

## 2016-01-28 DIAGNOSIS — Z79899 Other long term (current) drug therapy: Secondary | ICD-10-CM | POA: Diagnosis not present

## 2016-01-28 DIAGNOSIS — R112 Nausea with vomiting, unspecified: Secondary | ICD-10-CM

## 2016-01-28 DIAGNOSIS — Z87891 Personal history of nicotine dependence: Secondary | ICD-10-CM

## 2016-01-28 DIAGNOSIS — Z8673 Personal history of transient ischemic attack (TIA), and cerebral infarction without residual deficits: Secondary | ICD-10-CM | POA: Diagnosis not present

## 2016-01-28 DIAGNOSIS — Z7982 Long term (current) use of aspirin: Secondary | ICD-10-CM

## 2016-01-28 DIAGNOSIS — I10 Essential (primary) hypertension: Secondary | ICD-10-CM | POA: Diagnosis not present

## 2016-01-28 DIAGNOSIS — E785 Hyperlipidemia, unspecified: Secondary | ICD-10-CM

## 2016-01-28 LAB — COMPREHENSIVE METABOLIC PANEL
ALBUMIN: 3.5 g/dL (ref 3.5–5.0)
ALT: 18 U/L (ref 14–54)
ANION GAP: 11 (ref 5–15)
AST: 17 U/L (ref 15–41)
Alkaline Phosphatase: 89 U/L (ref 38–126)
BUN: 34 mg/dL — AB (ref 6–20)
CHLORIDE: 91 mmol/L — AB (ref 101–111)
CO2: 27 mmol/L (ref 22–32)
Calcium: 9.1 mg/dL (ref 8.9–10.3)
Creatinine, Ser: 1.22 mg/dL — ABNORMAL HIGH (ref 0.44–1.00)
GFR calc Af Amer: 50 mL/min — ABNORMAL LOW (ref >60)
GFR calc non Af Amer: 43 mL/min — ABNORMAL LOW (ref >60)
GLUCOSE: 373 mg/dL — AB (ref 65–99)
POTASSIUM: 5 mmol/L (ref 3.5–5.1)
SODIUM: 129 mmol/L — AB (ref 135–145)
Total Bilirubin: 0.6 mg/dL (ref 0.3–1.2)
Total Protein: 6.5 g/dL (ref 6.5–8.1)

## 2016-01-28 LAB — CBC WITH DIFFERENTIAL/PLATELET
BASOS ABS: 0 10*3/uL (ref 0–0.1)
BASOS PCT: 0 %
EOS ABS: 0 10*3/uL (ref 0–0.7)
EOS PCT: 0 %
HEMATOCRIT: 27.3 % — AB (ref 35.0–47.0)
Hemoglobin: 9.3 g/dL — ABNORMAL LOW (ref 12.0–16.0)
Lymphocytes Relative: 6 %
Lymphs Abs: 0.5 10*3/uL — ABNORMAL LOW (ref 1.0–3.6)
MCH: 32.6 pg (ref 26.0–34.0)
MCHC: 34.1 g/dL (ref 32.0–36.0)
MCV: 95.4 fL (ref 80.0–100.0)
MONO ABS: 0.9 10*3/uL (ref 0.2–0.9)
Monocytes Relative: 11 %
NEUTROS ABS: 6.6 10*3/uL — AB (ref 1.4–6.5)
Neutrophils Relative %: 83 %
PLATELETS: 180 10*3/uL (ref 150–440)
RBC: 2.87 MIL/uL — ABNORMAL LOW (ref 3.80–5.20)
RDW: 20.6 % — AB (ref 11.5–14.5)
WBC: 8 10*3/uL (ref 3.6–11.0)

## 2016-01-28 MED ORDER — SODIUM CHLORIDE 0.9 % IV SOLN
Freq: Once | INTRAVENOUS | Status: AC
  Administered 2016-01-28: 14:00:00 via INTRAVENOUS
  Filled 2016-01-28: qty 5

## 2016-01-28 MED ORDER — POTASSIUM CHLORIDE 2 MEQ/ML IV SOLN
Freq: Once | INTRAVENOUS | Status: AC
  Administered 2016-01-28: 12:00:00 via INTRAVENOUS
  Filled 2016-01-28: qty 1000

## 2016-01-28 MED ORDER — SODIUM CHLORIDE 0.9 % IV SOLN
Freq: Once | INTRAVENOUS | Status: AC
  Administered 2016-01-28: 12:00:00 via INTRAVENOUS
  Filled 2016-01-28: qty 1000

## 2016-01-28 MED ORDER — SODIUM CHLORIDE 0.9 % IV SOLN
80.0000 mg/m2 | Freq: Once | INTRAVENOUS | Status: AC
  Administered 2016-01-28: 150 mg via INTRAVENOUS
  Filled 2016-01-28: qty 7.5

## 2016-01-28 MED ORDER — HEPARIN SOD (PORK) LOCK FLUSH 100 UNIT/ML IV SOLN
500.0000 [IU] | Freq: Once | INTRAVENOUS | Status: AC | PRN
  Administered 2016-01-28: 500 [IU]
  Filled 2016-01-28: qty 5

## 2016-01-28 MED ORDER — SODIUM CHLORIDE 0.9 % IV SOLN
80.0000 mg/m2 | Freq: Once | INTRAVENOUS | Status: AC
  Administered 2016-01-28: 148 mg via INTRAVENOUS
  Filled 2016-01-28: qty 100

## 2016-01-28 MED ORDER — PALONOSETRON HCL INJECTION 0.25 MG/5ML
0.2500 mg | Freq: Once | INTRAVENOUS | Status: AC
  Administered 2016-01-28: 0.25 mg via INTRAVENOUS
  Filled 2016-01-28: qty 5

## 2016-01-28 NOTE — Progress Notes (Signed)
States is feeling well today. Offers no complaints. Was started on abx by ED for UTI. Will finish abx tomorrow.

## 2016-01-29 ENCOUNTER — Inpatient Hospital Stay: Payer: Medicare Other

## 2016-01-29 VITALS — BP 126/77 | HR 80 | Temp 96.9°F | Resp 20

## 2016-01-29 DIAGNOSIS — R05 Cough: Secondary | ICD-10-CM | POA: Diagnosis not present

## 2016-01-29 DIAGNOSIS — I2510 Atherosclerotic heart disease of native coronary artery without angina pectoris: Secondary | ICD-10-CM | POA: Diagnosis not present

## 2016-01-29 DIAGNOSIS — C3411 Malignant neoplasm of upper lobe, right bronchus or lung: Secondary | ICD-10-CM

## 2016-01-29 DIAGNOSIS — Z7982 Long term (current) use of aspirin: Secondary | ICD-10-CM | POA: Diagnosis not present

## 2016-01-29 DIAGNOSIS — Z7984 Long term (current) use of oral hypoglycemic drugs: Secondary | ICD-10-CM | POA: Diagnosis not present

## 2016-01-29 DIAGNOSIS — R112 Nausea with vomiting, unspecified: Secondary | ICD-10-CM | POA: Diagnosis not present

## 2016-01-29 DIAGNOSIS — E785 Hyperlipidemia, unspecified: Secondary | ICD-10-CM | POA: Diagnosis not present

## 2016-01-29 DIAGNOSIS — Z8673 Personal history of transient ischemic attack (TIA), and cerebral infarction without residual deficits: Secondary | ICD-10-CM | POA: Diagnosis not present

## 2016-01-29 DIAGNOSIS — Z5111 Encounter for antineoplastic chemotherapy: Secondary | ICD-10-CM | POA: Diagnosis not present

## 2016-01-29 DIAGNOSIS — J449 Chronic obstructive pulmonary disease, unspecified: Secondary | ICD-10-CM | POA: Diagnosis not present

## 2016-01-29 DIAGNOSIS — E1165 Type 2 diabetes mellitus with hyperglycemia: Secondary | ICD-10-CM | POA: Diagnosis not present

## 2016-01-29 DIAGNOSIS — R0602 Shortness of breath: Secondary | ICD-10-CM | POA: Diagnosis not present

## 2016-01-29 DIAGNOSIS — D649 Anemia, unspecified: Secondary | ICD-10-CM | POA: Diagnosis not present

## 2016-01-29 DIAGNOSIS — Z87891 Personal history of nicotine dependence: Secondary | ICD-10-CM | POA: Diagnosis not present

## 2016-01-29 DIAGNOSIS — Z79899 Other long term (current) drug therapy: Secondary | ICD-10-CM | POA: Diagnosis not present

## 2016-01-29 DIAGNOSIS — I10 Essential (primary) hypertension: Secondary | ICD-10-CM | POA: Diagnosis not present

## 2016-01-29 MED ORDER — ETOPOSIDE CHEMO INJECTION 1 GM/50ML
80.0000 mg/m2 | Freq: Once | INTRAVENOUS | Status: AC
  Administered 2016-01-29: 150 mg via INTRAVENOUS
  Filled 2016-01-29: qty 7.5

## 2016-01-29 MED ORDER — ONDANSETRON 8 MG PO TBDP
8.0000 mg | ORAL_TABLET | Freq: Once | ORAL | Status: AC
  Administered 2016-01-29: 8 mg via ORAL
  Filled 2016-01-29: qty 1

## 2016-01-29 MED ORDER — SODIUM CHLORIDE 0.9 % IV SOLN
Freq: Once | INTRAVENOUS | Status: AC
  Administered 2016-01-29: 14:00:00 via INTRAVENOUS
  Filled 2016-01-29: qty 1000

## 2016-01-29 MED ORDER — HEPARIN SOD (PORK) LOCK FLUSH 100 UNIT/ML IV SOLN
500.0000 [IU] | Freq: Once | INTRAVENOUS | Status: AC | PRN
  Administered 2016-01-29: 500 [IU]
  Filled 2016-01-29: qty 5

## 2016-01-29 MED ORDER — SODIUM CHLORIDE 0.9% FLUSH
10.0000 mL | INTRAVENOUS | Status: DC | PRN
  Administered 2016-01-29: 10 mL
  Filled 2016-01-29: qty 10

## 2016-01-29 MED ORDER — SODIUM CHLORIDE 0.9 % IV SOLN: Freq: Once | INTRAVENOUS | Status: DC

## 2016-01-30 ENCOUNTER — Inpatient Hospital Stay: Payer: Medicare Other

## 2016-01-30 DIAGNOSIS — C3411 Malignant neoplasm of upper lobe, right bronchus or lung: Secondary | ICD-10-CM | POA: Diagnosis not present

## 2016-01-30 DIAGNOSIS — Z7984 Long term (current) use of oral hypoglycemic drugs: Secondary | ICD-10-CM | POA: Diagnosis not present

## 2016-01-30 DIAGNOSIS — R112 Nausea with vomiting, unspecified: Secondary | ICD-10-CM | POA: Diagnosis not present

## 2016-01-30 DIAGNOSIS — I2510 Atherosclerotic heart disease of native coronary artery without angina pectoris: Secondary | ICD-10-CM | POA: Diagnosis not present

## 2016-01-30 DIAGNOSIS — J449 Chronic obstructive pulmonary disease, unspecified: Secondary | ICD-10-CM | POA: Diagnosis not present

## 2016-01-30 DIAGNOSIS — Z79899 Other long term (current) drug therapy: Secondary | ICD-10-CM | POA: Diagnosis not present

## 2016-01-30 DIAGNOSIS — I10 Essential (primary) hypertension: Secondary | ICD-10-CM | POA: Diagnosis not present

## 2016-01-30 DIAGNOSIS — E785 Hyperlipidemia, unspecified: Secondary | ICD-10-CM | POA: Diagnosis not present

## 2016-01-30 DIAGNOSIS — Z8673 Personal history of transient ischemic attack (TIA), and cerebral infarction without residual deficits: Secondary | ICD-10-CM | POA: Diagnosis not present

## 2016-01-30 DIAGNOSIS — D649 Anemia, unspecified: Secondary | ICD-10-CM | POA: Diagnosis not present

## 2016-01-30 DIAGNOSIS — E1165 Type 2 diabetes mellitus with hyperglycemia: Secondary | ICD-10-CM | POA: Diagnosis not present

## 2016-01-30 DIAGNOSIS — Z87891 Personal history of nicotine dependence: Secondary | ICD-10-CM | POA: Diagnosis not present

## 2016-01-30 DIAGNOSIS — R05 Cough: Secondary | ICD-10-CM | POA: Diagnosis not present

## 2016-01-30 DIAGNOSIS — R0602 Shortness of breath: Secondary | ICD-10-CM | POA: Diagnosis not present

## 2016-01-30 DIAGNOSIS — Z7982 Long term (current) use of aspirin: Secondary | ICD-10-CM | POA: Diagnosis not present

## 2016-01-30 DIAGNOSIS — Z5111 Encounter for antineoplastic chemotherapy: Secondary | ICD-10-CM | POA: Diagnosis not present

## 2016-01-30 MED ORDER — SODIUM CHLORIDE 0.9 % IV SOLN
80.0000 mg/m2 | Freq: Once | INTRAVENOUS | Status: AC
  Administered 2016-01-30: 150 mg via INTRAVENOUS
  Filled 2016-01-30: qty 7.5

## 2016-01-30 MED ORDER — SODIUM CHLORIDE 0.9 % IV SOLN
Freq: Once | INTRAVENOUS | Status: AC
  Administered 2016-01-30: 14:00:00 via INTRAVENOUS
  Filled 2016-01-30: qty 1000

## 2016-01-30 MED ORDER — ONDANSETRON 8 MG PO TBDP
8.0000 mg | ORAL_TABLET | Freq: Once | ORAL | Status: AC
  Administered 2016-01-30: 8 mg via ORAL
  Filled 2016-01-30: qty 1

## 2016-01-30 MED ORDER — HEPARIN SOD (PORK) LOCK FLUSH 100 UNIT/ML IV SOLN
500.0000 [IU] | Freq: Once | INTRAVENOUS | Status: AC | PRN
  Administered 2016-01-30: 500 [IU]
  Filled 2016-01-30: qty 5

## 2016-02-05 ENCOUNTER — Inpatient Hospital Stay: Payer: Medicare Other

## 2016-02-05 ENCOUNTER — Inpatient Hospital Stay
Admission: AD | Admit: 2016-02-05 | Discharge: 2016-02-13 | DRG: 682 | Disposition: A | Discharge status: Home / Self Care | Payer: Medicare Other | Source: Ambulatory Visit | Attending: Internal Medicine | Admitting: Internal Medicine

## 2016-02-05 ENCOUNTER — Encounter: Payer: Self-pay | Admitting: *Deleted

## 2016-02-05 ENCOUNTER — Inpatient Hospital Stay: Payer: Medicare Other | Attending: Oncology

## 2016-02-05 ENCOUNTER — Telehealth: Payer: Self-pay | Admitting: *Deleted

## 2016-02-05 VITALS — BP 111/71 | HR 93 | Temp 97.5°F | Resp 20

## 2016-02-05 DIAGNOSIS — C3411 Malignant neoplasm of upper lobe, right bronchus or lung: Secondary | ICD-10-CM | POA: Diagnosis not present

## 2016-02-05 DIAGNOSIS — Z8701 Personal history of pneumonia (recurrent): Secondary | ICD-10-CM | POA: Insufficient documentation

## 2016-02-05 DIAGNOSIS — E860 Dehydration: Secondary | ICD-10-CM

## 2016-02-05 DIAGNOSIS — Z9221 Personal history of antineoplastic chemotherapy: Secondary | ICD-10-CM

## 2016-02-05 DIAGNOSIS — R05 Cough: Secondary | ICD-10-CM | POA: Diagnosis not present

## 2016-02-05 DIAGNOSIS — R531 Weakness: Secondary | ICD-10-CM | POA: Insufficient documentation

## 2016-02-05 DIAGNOSIS — E861 Hypovolemia: Secondary | ICD-10-CM | POA: Diagnosis present

## 2016-02-05 DIAGNOSIS — Z79899 Other long term (current) drug therapy: Secondary | ICD-10-CM

## 2016-02-05 DIAGNOSIS — N183 Chronic kidney disease, stage 3 (moderate): Secondary | ICD-10-CM | POA: Diagnosis not present

## 2016-02-05 DIAGNOSIS — I2510 Atherosclerotic heart disease of native coronary artery without angina pectoris: Secondary | ICD-10-CM | POA: Diagnosis present

## 2016-02-05 DIAGNOSIS — D6959 Other secondary thrombocytopenia: Secondary | ICD-10-CM | POA: Diagnosis not present

## 2016-02-05 DIAGNOSIS — D649 Anemia, unspecified: Secondary | ICD-10-CM | POA: Diagnosis not present

## 2016-02-05 DIAGNOSIS — H3530 Unspecified macular degeneration: Secondary | ICD-10-CM | POA: Diagnosis not present

## 2016-02-05 DIAGNOSIS — Z801 Family history of malignant neoplasm of trachea, bronchus and lung: Secondary | ICD-10-CM | POA: Diagnosis not present

## 2016-02-05 DIAGNOSIS — E1165 Type 2 diabetes mellitus with hyperglycemia: Secondary | ICD-10-CM | POA: Insufficient documentation

## 2016-02-05 DIAGNOSIS — Z5189 Encounter for other specified aftercare: Secondary | ICD-10-CM

## 2016-02-05 DIAGNOSIS — R0602 Shortness of breath: Secondary | ICD-10-CM | POA: Diagnosis not present

## 2016-02-05 DIAGNOSIS — Z7984 Long term (current) use of oral hypoglycemic drugs: Secondary | ICD-10-CM | POA: Insufficient documentation

## 2016-02-05 DIAGNOSIS — D709 Neutropenia, unspecified: Secondary | ICD-10-CM | POA: Insufficient documentation

## 2016-02-05 DIAGNOSIS — D696 Thrombocytopenia, unspecified: Secondary | ICD-10-CM | POA: Diagnosis not present

## 2016-02-05 DIAGNOSIS — Z981 Arthrodesis status: Secondary | ICD-10-CM

## 2016-02-05 DIAGNOSIS — Z6826 Body mass index (BMI) 26.0-26.9, adult: Secondary | ICD-10-CM

## 2016-02-05 DIAGNOSIS — Z87891 Personal history of nicotine dependence: Secondary | ICD-10-CM

## 2016-02-05 DIAGNOSIS — R112 Nausea with vomiting, unspecified: Secondary | ICD-10-CM | POA: Insufficient documentation

## 2016-02-05 DIAGNOSIS — R509 Fever, unspecified: Secondary | ICD-10-CM

## 2016-02-05 DIAGNOSIS — N179 Acute kidney failure, unspecified: Secondary | ICD-10-CM | POA: Diagnosis present

## 2016-02-05 DIAGNOSIS — Z9981 Dependence on supplemental oxygen: Secondary | ICD-10-CM | POA: Diagnosis not present

## 2016-02-05 DIAGNOSIS — I129 Hypertensive chronic kidney disease with stage 1 through stage 4 chronic kidney disease, or unspecified chronic kidney disease: Secondary | ICD-10-CM | POA: Diagnosis present

## 2016-02-05 DIAGNOSIS — J449 Chronic obstructive pulmonary disease, unspecified: Secondary | ICD-10-CM | POA: Insufficient documentation

## 2016-02-05 DIAGNOSIS — R630 Anorexia: Secondary | ICD-10-CM

## 2016-02-05 DIAGNOSIS — Z823 Family history of stroke: Secondary | ICD-10-CM

## 2016-02-05 DIAGNOSIS — N170 Acute kidney failure with tubular necrosis: Principal | ICD-10-CM | POA: Diagnosis present

## 2016-02-05 DIAGNOSIS — R1115 Cyclical vomiting syndrome unrelated to migraine: Secondary | ICD-10-CM

## 2016-02-05 DIAGNOSIS — Z8249 Family history of ischemic heart disease and other diseases of the circulatory system: Secondary | ICD-10-CM

## 2016-02-05 DIAGNOSIS — D702 Other drug-induced agranulocytosis: Secondary | ICD-10-CM

## 2016-02-05 DIAGNOSIS — Z794 Long term (current) use of insulin: Secondary | ICD-10-CM

## 2016-02-05 DIAGNOSIS — Z9889 Other specified postprocedural states: Secondary | ICD-10-CM

## 2016-02-05 DIAGNOSIS — J90 Pleural effusion, not elsewhere classified: Secondary | ICD-10-CM | POA: Diagnosis not present

## 2016-02-05 DIAGNOSIS — Z7689 Persons encountering health services in other specified circumstances: Secondary | ICD-10-CM | POA: Diagnosis not present

## 2016-02-05 DIAGNOSIS — I10 Essential (primary) hypertension: Secondary | ICD-10-CM | POA: Diagnosis not present

## 2016-02-05 DIAGNOSIS — Z9049 Acquired absence of other specified parts of digestive tract: Secondary | ICD-10-CM | POA: Diagnosis not present

## 2016-02-05 DIAGNOSIS — R262 Difficulty in walking, not elsewhere classified: Secondary | ICD-10-CM

## 2016-02-05 DIAGNOSIS — E43 Unspecified severe protein-calorie malnutrition: Secondary | ICD-10-CM | POA: Diagnosis not present

## 2016-02-05 DIAGNOSIS — Z923 Personal history of irradiation: Secondary | ICD-10-CM

## 2016-02-05 DIAGNOSIS — M6281 Muscle weakness (generalized): Secondary | ICD-10-CM

## 2016-02-05 DIAGNOSIS — Z7982 Long term (current) use of aspirin: Secondary | ICD-10-CM

## 2016-02-05 DIAGNOSIS — E785 Hyperlipidemia, unspecified: Secondary | ICD-10-CM

## 2016-02-05 DIAGNOSIS — Z8673 Personal history of transient ischemic attack (TIA), and cerebral infarction without residual deficits: Secondary | ICD-10-CM | POA: Diagnosis not present

## 2016-02-05 DIAGNOSIS — E119 Type 2 diabetes mellitus without complications: Secondary | ICD-10-CM | POA: Diagnosis not present

## 2016-02-05 DIAGNOSIS — R109 Unspecified abdominal pain: Secondary | ICD-10-CM

## 2016-02-05 DIAGNOSIS — C3490 Malignant neoplasm of unspecified part of unspecified bronchus or lung: Secondary | ICD-10-CM | POA: Diagnosis not present

## 2016-02-05 DIAGNOSIS — Z90710 Acquired absence of both cervix and uterus: Secondary | ICD-10-CM

## 2016-02-05 DIAGNOSIS — E1122 Type 2 diabetes mellitus with diabetic chronic kidney disease: Secondary | ICD-10-CM | POA: Diagnosis present

## 2016-02-05 DIAGNOSIS — R5383 Other fatigue: Secondary | ICD-10-CM | POA: Diagnosis not present

## 2016-02-05 DIAGNOSIS — D61818 Other pancytopenia: Secondary | ICD-10-CM | POA: Diagnosis present

## 2016-02-05 DIAGNOSIS — E876 Hypokalemia: Secondary | ICD-10-CM | POA: Diagnosis present

## 2016-02-05 DIAGNOSIS — R197 Diarrhea, unspecified: Secondary | ICD-10-CM | POA: Diagnosis not present

## 2016-02-05 DIAGNOSIS — T451X5A Adverse effect of antineoplastic and immunosuppressive drugs, initial encounter: Secondary | ICD-10-CM | POA: Diagnosis present

## 2016-02-05 LAB — COMPREHENSIVE METABOLIC PANEL
ALBUMIN: 3.4 g/dL — AB (ref 3.5–5.0)
ALT: 15 U/L (ref 14–54)
ANION GAP: 14 (ref 5–15)
AST: 19 U/L (ref 15–41)
Alkaline Phosphatase: 72 U/L (ref 38–126)
BILIRUBIN TOTAL: 0.7 mg/dL (ref 0.3–1.2)
BUN: 83 mg/dL — AB (ref 6–20)
CHLORIDE: 91 mmol/L — AB (ref 101–111)
CO2: 27 mmol/L (ref 22–32)
Calcium: 8.6 mg/dL — ABNORMAL LOW (ref 8.9–10.3)
Creatinine, Ser: 3.27 mg/dL — ABNORMAL HIGH (ref 0.44–1.00)
GFR calc Af Amer: 15 mL/min — ABNORMAL LOW (ref >60)
GFR calc non Af Amer: 13 mL/min — ABNORMAL LOW (ref >60)
GLUCOSE: 282 mg/dL — AB (ref 65–99)
POTASSIUM: 4.5 mmol/L (ref 3.5–5.1)
SODIUM: 132 mmol/L — AB (ref 135–145)
Total Protein: 6.3 g/dL — ABNORMAL LOW (ref 6.5–8.1)

## 2016-02-05 LAB — CBC WITH DIFFERENTIAL/PLATELET
BASOS ABS: 0 10*3/uL (ref 0–0.1)
BASOS PCT: 0 %
EOS ABS: 0 10*3/uL (ref 0–0.7)
Eosinophils Relative: 1 %
HEMATOCRIT: 22.2 % — AB (ref 35.0–47.0)
Hemoglobin: 7.7 g/dL — ABNORMAL LOW (ref 12.0–16.0)
Lymphocytes Relative: 11 %
Lymphs Abs: 0.3 10*3/uL — ABNORMAL LOW (ref 1.0–3.6)
MCH: 32.9 pg (ref 26.0–34.0)
MCHC: 34.5 g/dL (ref 32.0–36.0)
MCV: 95.5 fL (ref 80.0–100.0)
MONO ABS: 0 10*3/uL — AB (ref 0.2–0.9)
Monocytes Relative: 1 %
NEUTROS ABS: 2.7 10*3/uL (ref 1.4–6.5)
Neutrophils Relative %: 87 %
PLATELETS: 75 10*3/uL — AB (ref 150–440)
RBC: 2.33 MIL/uL — ABNORMAL LOW (ref 3.80–5.20)
RDW: 19.3 % — AB (ref 11.5–14.5)
WBC: 3.1 10*3/uL — ABNORMAL LOW (ref 3.6–11.0)

## 2016-02-05 LAB — GLUCOSE, CAPILLARY
GLUCOSE-CAPILLARY: 185 mg/dL — AB (ref 65–99)
GLUCOSE-CAPILLARY: 147 mg/dL — AB (ref 65–99)
Glucose-Capillary: 208 mg/dL — ABNORMAL HIGH (ref 65–99)

## 2016-02-05 LAB — SAMPLE TO BLOOD BANK

## 2016-02-05 LAB — PREPARE RBC (CROSSMATCH)

## 2016-02-05 LAB — MAGNESIUM: MAGNESIUM: 1.9 mg/dL (ref 1.7–2.4)

## 2016-02-05 MED ORDER — SODIUM CHLORIDE 0.9 % IV SOLN
INTRAVENOUS | Status: DC
  Administered 2016-02-05 – 2016-02-09 (×8): via INTRAVENOUS

## 2016-02-05 MED ORDER — ACETAMINOPHEN 325 MG PO TABS
650.0000 mg | ORAL_TABLET | Freq: Four times a day (QID) | ORAL | Status: DC | PRN
  Administered 2016-02-10: 650 mg via ORAL
  Filled 2016-02-05 (×2): qty 2

## 2016-02-05 MED ORDER — SODIUM CHLORIDE 0.9% FLUSH
10.0000 mL | INTRAVENOUS | Status: DC | PRN
  Administered 2016-02-05: 10 mL
  Filled 2016-02-05: qty 10

## 2016-02-05 MED ORDER — PANTOPRAZOLE SODIUM 40 MG PO TBEC
40.0000 mg | DELAYED_RELEASE_TABLET | Freq: Every day | ORAL | Status: DC
  Administered 2016-02-05 – 2016-02-13 (×9): 40 mg via ORAL
  Filled 2016-02-05 (×9): qty 1

## 2016-02-05 MED ORDER — ONDANSETRON HCL 4 MG PO TABS: 4.0000 mg | ORAL_TABLET | Freq: Four times a day (QID) | ORAL | Status: DC | PRN

## 2016-02-05 MED ORDER — ONDANSETRON HCL 4 MG/2ML IJ SOLN
8.0000 mg | Freq: Once | INTRAMUSCULAR | Status: AC
  Administered 2016-02-05: 8 mg via INTRAVENOUS

## 2016-02-05 MED ORDER — PROMETHAZINE HCL 25 MG/ML IJ SOLN
12.5000 mg | Freq: Four times a day (QID) | INTRAMUSCULAR | Status: DC | PRN
Start: 2016-02-05 — End: 2016-02-11
  Administered 2016-02-09: 17:00:00 12.5 mg via INTRAVENOUS
  Filled 2016-02-05: qty 1

## 2016-02-05 MED ORDER — DIAZEPAM 5 MG PO TABS
5.0000 mg | ORAL_TABLET | Freq: Four times a day (QID) | ORAL | Status: DC | PRN
  Administered 2016-02-12: 5 mg via ORAL
  Filled 2016-02-05: qty 1

## 2016-02-05 MED ORDER — INSULIN ASPART 100 UNIT/ML ~~LOC~~ SOLN
0.0000 [IU] | Freq: Three times a day (TID) | SUBCUTANEOUS | Status: DC
  Administered 2016-02-05: 3 [IU] via SUBCUTANEOUS
  Administered 2016-02-06: 2 [IU] via SUBCUTANEOUS
  Administered 2016-02-06 (×2): 1 [IU] via SUBCUTANEOUS
  Administered 2016-02-07: 17:00:00 2 [IU] via SUBCUTANEOUS
  Administered 2016-02-07: 12:00:00 3 [IU] via SUBCUTANEOUS
  Administered 2016-02-07: 08:00:00 2 [IU] via SUBCUTANEOUS
  Administered 2016-02-08: 12:00:00 3 [IU] via SUBCUTANEOUS
  Administered 2016-02-08 (×2): 2 [IU] via SUBCUTANEOUS
  Administered 2016-02-09 (×2): 3 [IU] via SUBCUTANEOUS
  Administered 2016-02-09: 12:00:00 5 [IU] via SUBCUTANEOUS
  Administered 2016-02-10: 3 [IU] via SUBCUTANEOUS
  Administered 2016-02-10 (×2): 2 [IU] via SUBCUTANEOUS
  Administered 2016-02-11: 3 [IU] via SUBCUTANEOUS
  Administered 2016-02-11: 12:00:00 9 [IU] via SUBCUTANEOUS
  Filled 2016-02-05 (×2): qty 2
  Filled 2016-02-05 (×2): qty 3
  Filled 2016-02-05: qty 2
  Filled 2016-02-05: qty 3
  Filled 2016-02-05: qty 9
  Filled 2016-02-05 (×2): qty 3
  Filled 2016-02-05: qty 1
  Filled 2016-02-05: qty 2
  Filled 2016-02-05 (×2): qty 3
  Filled 2016-02-05: qty 2
  Filled 2016-02-05: qty 5
  Filled 2016-02-05 (×2): qty 2
  Filled 2016-02-05: qty 1

## 2016-02-05 MED ORDER — ATORVASTATIN CALCIUM 20 MG PO TABS
20.0000 mg | ORAL_TABLET | Freq: Every day | ORAL | Status: DC
  Administered 2016-02-05 – 2016-02-12 (×7): 20 mg via ORAL
  Filled 2016-02-05 (×8): qty 1

## 2016-02-05 MED ORDER — ASPIRIN EC 81 MG PO TBEC
81.0000 mg | DELAYED_RELEASE_TABLET | ORAL | Status: DC
  Administered 2016-02-06 – 2016-02-13 (×8): 81 mg via ORAL
  Filled 2016-02-05 (×9): qty 1

## 2016-02-05 MED ORDER — INSULIN ASPART 100 UNIT/ML ~~LOC~~ SOLN
0.0000 [IU] | Freq: Every day | SUBCUTANEOUS | Status: DC
  Administered 2016-02-08: 2 [IU] via SUBCUTANEOUS
  Administered 2016-02-10 – 2016-02-11 (×2): 3 [IU] via SUBCUTANEOUS
  Filled 2016-02-05 (×2): qty 2
  Filled 2016-02-05: qty 1
  Filled 2016-02-05: qty 3

## 2016-02-05 MED ORDER — ONDANSETRON HCL 4 MG/2ML IJ SOLN
INTRAMUSCULAR | Status: AC
  Filled 2016-02-05: qty 4

## 2016-02-05 MED ORDER — MOMETASONE FURO-FORMOTEROL FUM 200-5 MCG/ACT IN AERO
2.0000 | INHALATION_SPRAY | Freq: Two times a day (BID) | RESPIRATORY_TRACT | Status: DC
  Administered 2016-02-05 – 2016-02-13 (×15): 2 via RESPIRATORY_TRACT
  Filled 2016-02-05: qty 8.8

## 2016-02-05 MED ORDER — ONDANSETRON 8 MG PO TBDP: 8.0000 mg | ORAL_TABLET | Freq: Once | ORAL | Status: DC

## 2016-02-05 MED ORDER — SODIUM CHLORIDE 0.9 % IV SOLN: Freq: Once | INTRAVENOUS | Status: DC

## 2016-02-05 MED ORDER — ISOSORBIDE MONONITRATE ER 30 MG PO TB24
30.0000 mg | ORAL_TABLET | Freq: Every day | ORAL | Status: DC
  Administered 2016-02-06 – 2016-02-13 (×8): 30 mg via ORAL
  Filled 2016-02-05 (×8): qty 1

## 2016-02-05 MED ORDER — SODIUM CHLORIDE 0.9 % IV SOLN
Freq: Once | INTRAVENOUS | Status: AC
  Administered 2016-02-05: 16:00:00 via INTRAVENOUS

## 2016-02-05 MED ORDER — GABAPENTIN 600 MG PO TABS
600.0000 mg | ORAL_TABLET | Freq: Two times a day (BID) | ORAL | Status: DC
  Administered 2016-02-05 – 2016-02-06 (×2): 600 mg via ORAL
  Filled 2016-02-05 (×2): qty 1

## 2016-02-05 MED ORDER — IPRATROPIUM-ALBUTEROL 0.5-2.5 (3) MG/3ML IN SOLN
3.0000 mL | Freq: Four times a day (QID) | RESPIRATORY_TRACT | Status: DC | PRN
Start: 2016-02-05 — End: 2016-02-13

## 2016-02-05 MED ORDER — ONDANSETRON HCL 4 MG/2ML IJ SOLN
4.0000 mg | Freq: Four times a day (QID) | INTRAMUSCULAR | Status: DC | PRN
  Administered 2016-02-05 – 2016-02-06 (×2): 4 mg via INTRAVENOUS
  Filled 2016-02-05 (×2): qty 2

## 2016-02-05 MED ORDER — HEPARIN SODIUM (PORCINE) 5000 UNIT/ML IJ SOLN: 5000.0000 [IU] | Freq: Three times a day (TID) | INTRAMUSCULAR | Status: DC

## 2016-02-05 MED ORDER — ACETAMINOPHEN 650 MG RE SUPP: 650.0000 mg | Freq: Four times a day (QID) | RECTAL | Status: DC | PRN

## 2016-02-05 MED ORDER — NITROGLYCERIN 0.4 MG SL SUBL: 0.4000 mg | SUBLINGUAL_TABLET | SUBLINGUAL | Status: DC | PRN

## 2016-02-05 MED ORDER — SODIUM CHLORIDE 0.9 % IV SOLN
Freq: Once | INTRAVENOUS | Status: AC
  Administered 2016-02-05: 12:00:00 via INTRAVENOUS
  Filled 2016-02-05: qty 1000

## 2016-02-05 NOTE — Progress Notes (Signed)
Pt direct admit to room 101. hospitalist will be admitting. Pt admitted for ARF, dehydration, intractable nausea/vomiting. Report called to Tamala Fothergill, RN. Pt escorted by orderly to room 101 via wheelchair. PAC accessed for transport.

## 2016-02-05 NOTE — Consult Note (Signed)
[name] [name]  Telephone:(336) [phone] Fax:(336) [phone]  ID: [name] [name] OB: 18-Dec-1940  MR#: [id number]  OZD#:[id number]  Patient Care Team: Jerrol Banana., MD as PCP - General (Unknown Physician Specialty) [name], MD as Consulting Physician (Cardiology) Algernon Huxley, MD as Consulting Physician (Vascular Surgery)  CHIEF COMPLAINT:   INTERVAL HISTORY: Patient initially evaluated in clinic. She is a 75 year old female undergoing chemotherapy with cisplatin and etoposide for the above stated lung cancer. She last received chemotherapy on October 24th-26th. Over the past several days she has developed intractable nausea and vomiting and is had minimal PO intake. She has no neurologic complaints. She denies any fevers. She denies any chest pain or shortness of breath. She has no constipation or diarrhea. She has no urinary complaints. Patient feels generally terrible, but offers no further specific complaints.  REVIEW OF SYSTEMS:   Review of Systems  Constitutional: Positive for malaise/fatigue. Negative for fever and weight loss.  Respiratory: Negative.  Negative for cough, hemoptysis and shortness of breath.   Cardiovascular: Negative.  Negative for chest pain and leg swelling.  Gastrointestinal: Positive for abdominal pain, nausea and vomiting.  Genitourinary: Negative.   Musculoskeletal: Negative.   Neurological: Positive for weakness.    As per HPI. Otherwise, a complete review of systems is negative.  PAST MEDICAL HISTORY: Past Medical History:  Diagnosis Date  . Abnormal CT lung screening 10/17/2015  . COPD (chronic obstructive pulmonary disease) ([place])   . Coronary artery disease, non-occlusive    a. cath 2006: min nonobs CAD; b. cath 12/2010: cath LAD 50%, RCA 60%; c. 08/2013: Minimal luminal irregs, right dominant system with no significant CAD, diffuse luminal irregs noted. Normal EF 55%, no AS or MS.   . Diabetes mellitus   . Hyperlipemia     Followed by Dr. Rosanna Randy  . Hypertension   . Lung cancer ([place])   . Macular degeneration    rt  . Personal history of tobacco use, presenting hazards to health 10/15/2015  . Pneumonia    hx  . Shortness of breath   . Stroke [facility])     PAST SURGICAL HISTORY: Past Surgical History:  Procedure Laterality Date  . ABDOMINAL HYSTERECTOMY    . ANTERIOR CERVICAL DECOMP/DISCECTOMY FUSION N/A 10/19/2013   Procedure: CERVICAL FIVE-SIX ANTERIOR CERVICAL DECOMPRESSION WITH FUSION INTERBODY PROSTHESIS PLATING AND PEEK CAGE;  Surgeon: Ophelia Charter, MD;  Location: [place] NEURO ORS;  Service: Neurosurgery;  Laterality: N/A;  . BACK SURGERY  80's  . BREAST CYST EXCISION Left    left negative   . CARDIAC CATHETERIZATION  05/2004  . CATARACT EXTRACTION Left   . CHOLECYSTECTOMY    . ENDARTERECTOMY Left 10/24/2014   Procedure: ENDARTERECTOMY CAROTID;  Surgeon: Algernon Huxley, MD;  Location: [facility] ORS;  Service: Vascular;  Laterality: Left;  . ENDOBRONCHIAL ULTRASOUND N/A 11/14/2015   Procedure: ENDOBRONCHIAL ULTRASOUND;  Surgeon: [name], MD;  Location: [facility] ORS;  Service: Cardiopulmonary;  Laterality: N/A;  . PERIPHERAL VASCULAR CATHETERIZATION N/A 12/04/2015   Procedure: Glori Luis Cath Insertion;  Surgeon: Algernon Huxley, MD;  Location: [place] CV LAB;  Service: Cardiovascular;  Laterality: N/A;  . TONSILLECTOMY AND ADENOIDECTOMY    . VESICOVAGINAL FISTULA CLOSURE W/ TAH      FAMILY HISTORY: Family History  Problem Relation Age of Onset  . Stroke Mother     Massive  . Heart attack Father     Massive  . Heart attack Brother   . Heart attack  Brother   . Lung cancer Maternal Grandfather   . Heart attack Paternal Grandmother     MI    ADVANCED DIRECTIVES (Y/N):  '@ADVDIR'$ @  HEALTH MAINTENANCE: Social History  Substance Use Topics  . Smoking status: Former Smoker    Packs/day: 1.00    Years: 50.00    Types: Cigarettes    Quit date: 10/03/2015  . Smokeless tobacco: Never Used     Comment:  smokes 3 cigs daily 05/06/15. Pt instructed to quit.  . Alcohol use No     Colonoscopy:  PAP:  Bone density:  Lipid panel:  Allergies  Allergen Reactions  . Coconut Fatty Acids Swelling    Throat swells    Current Facility-Administered Medications  Medication Dose Route Frequency Provider Last Rate Last Dose  . 0.9 %  sodium chloride infusion   Intravenous Continuous Henreitta Leber, MD      . 0.9 %  sodium chloride infusion   Intravenous Once Henreitta Leber, MD      . acetaminophen (TYLENOL) tablet 650 mg  650 mg Oral Q6H PRN Henreitta Leber, MD       Or  . acetaminophen (TYLENOL) suppository 650 mg  650 mg Rectal Q6H PRN Henreitta Leber, MD      . insulin aspart (novoLOG) injection 0-5 Units  0-5 Units Subcutaneous QHS Henreitta Leber, MD      . insulin aspart (novoLOG) injection 0-9 Units  0-9 Units Subcutaneous TID WC Henreitta Leber, MD      . ipratropium-albuterol (DUONEB) 0.5-2.5 (3) MG/3ML nebulizer solution 3 mL  3 mL Nebulization Q6H PRN Henreitta Leber, MD      . ondansetron (ZOFRAN) tablet 4 mg  4 mg Oral Q6H PRN Henreitta Leber, MD       Or  . ondansetron (ZOFRAN) injection 4 mg  4 mg Intravenous Q6H PRN Henreitta Leber, MD      . promethazine (PHENERGAN) injection 12.5 mg  12.5 mg Intravenous Q6H PRN Henreitta Leber, MD       Facility-Administered Medications Ordered in Other Encounters  Medication Dose Route Frequency Provider Last Rate Last Dose  . ondansetron (ZOFRAN) 8 mg, dexamethasone (DECADRON) 10 mg in sodium chloride 0.9 % 50 mL IVPB   Intravenous Once Lloyd Huger, MD      . sodium chloride flush (NS) 0.9 % injection 10 mL  10 mL Intracatheter PRN Lloyd Huger, MD   10 mL at 02/05/16 1100    OBJECTIVE: Vitals:   02/05/16 1511  BP: (!) 107/39  Pulse: 75  Resp: 18  Temp: 98.5 F (36.9 C)     Body mass index is 26.42 kg/m.    ECOG FS:2 - Symptomatic, <50% confined to bed  General: Ill-appearing, mild distress. Eyes: Pink conjunctiva,  anicteric sclera. HEENT: Normocephalic, moist mucous membranes, clear oropharnyx. Lungs: Clear to auscultation bilaterally. Heart: Regular rate and rhythm. No rubs, murmurs, or gallops. Abdomen: Soft, nontender, nondistended. No organomegaly noted, normoactive bowel sounds. Musculoskeletal: No edema, cyanosis, or clubbing. Neuro: Alert, answering all questions appropriately. Cranial nerves grossly intact. Skin: No rashes or petechiae noted. Psych: Normal affect.  LAB RESULTS:  Lab Results  Component Value Date   NA 132 (L) 02/05/2016   K 4.5 02/05/2016   CL 91 (L) 02/05/2016   CO2 27 02/05/2016   GLUCOSE 282 (H) 02/05/2016   BUN 83 (H) 02/05/2016   CREATININE 3.27 (H) 02/05/2016   CALCIUM 8.6 (L) 02/05/2016  PROT 6.3 (L) 02/05/2016   ALBUMIN 3.4 (L) 02/05/2016   AST 19 02/05/2016   ALT 15 02/05/2016   ALKPHOS 72 02/05/2016   BILITOT 0.7 02/05/2016   GFRNONAA 13 (L) 02/05/2016   GFRAA 15 (L) 02/05/2016    Lab Results  Component Value Date   WBC 3.1 (L) 02/05/2016   NEUTROABS 2.7 02/05/2016   HGB 7.7 (L) 02/05/2016   HCT 22.2 (L) 02/05/2016   MCV 95.5 02/05/2016   PLT 75 (L) 02/05/2016     STUDIES: Dg Chest 2 View  Result Date: 01/21/2016 CLINICAL DATA:  Chronic cough and shortness of breath. Stage III small cell right upper lobe lung cancer. EXAM: CHEST  2 VIEW COMPARISON:  PET-CT 10/23/2015. Chest CT 10/15/2015. Chest radiographs 01/27/2015. FINDINGS: Left jugular Port-A-Cath terminates over the lower SVC. The cardiomediastinal silhouette is within normal limits. Right upper lobe nodule on PET-CT is not well seen on these radiographs. No airspace consolidation, edema, pleural effusion, or pneumothorax is identified. Right upper quadrant abdominal surgical clips and prior ACDF are noted. No acute osseous abnormality is seen. IMPRESSION: No evidence of acute cardiopulmonary process. Electronically Signed   By: Logan Bores M.D.   On: 01/21/2016 12:14    ASSESSMENT:  Stage III small cell lung carcinoma of the right upper lobe, intractable nausea and vomiting, diarrhea, acute renal failure.  PLAN:    1. Intractable nausea and vomiting: Possibly secondary to chemotherapy. Recommend scheduled antiemetics along with continuous IV fluids. 2. Dehydration: IV fluids as above. 3. Acute renal failure: Most likely remain related to hypovolemia and dehydration. We will monitor closely since patient sees cisplatin approximately one week ago. Agree with nephrology consult. 4. Clinical stage IIIa small cell lung carcinoma of the right upper lobe lung: CT and PET scan results reviewed independently concerning for stage IIIa disease given patient's 11 mm right paratracheal lymph node positive for small cell lung cancer. Patient has no other evidence of metastatic disease. MRI the brain is negative. Patient received cycle 3 of cisplatin and etoposide one week ago. Her next scheduled chemotherapy is in approximately 2 weeks. She has now completed XRT.  5. Hyperglycemia: Patient's blood sugars significantly elevated today, monitor. May be contributing to patient's nausea and vomiting.  6. Anemia: Patient's hemoglobin is 7.7, is possibly falsely elevated given hemoconcentration from dehydration. Agree with 1 unit packed red blood cells. 7. Thrombocytopenia: Secondary to chemotherapy, monitor.  Appreciate consult, will follow.    Lloyd Huger, MD   02/05/2016 3:57 PM

## 2016-02-05 NOTE — H&P (Signed)
[facility] [place] NAME: [name] [name]    MR#:  [id number]  DATE OF BIRTH:  1940/08/11  DATE OF ADMISSION:  02/05/2016  PRIMARY CARE PHYSICIAN: [name], MD   REQUESTING/REFERRING PHYSICIAN: Dr. Lamar Blinks  CHIEF COMPLAINT:  No chief complaint on file. Nausea, vomiting, Diarrhea.   HISTORY OF PRESENT ILLNESS:  [name] [name]  is a 75 y.o. female with a known history of Diabetes, COPD, hypertension, hyperlipidemia, lung cancer currently getting chemotherapy radiation, macular degeneration, history of previous CVA who presented to the hospital due to ongoing nausea vomiting and diarrhea and noted to be in acute renal failure. Patient is currently getting chemoradiation for her lung cancer and had her last chemotherapy treatment about a week ago. She says since this past Friday she is planning having persistent nausea vomiting and has not been able to keep anything down. She went to see her oncologist today at the [facility] was noted to be in acute kidney injury and noted to be acutely dehydrated. She was sent to the hospital for admission. She admits to intermittent abdominal pain associated with her nausea vomiting, but no documented fever, chills. She denies any chest pain, worsening shortness of breath, weight loss, hemoptysis or any other associated symptoms presently.  PAST MEDICAL HISTORY:   Past Medical History:  Diagnosis Date  . Abnormal CT lung screening 10/17/2015  . COPD (chronic obstructive pulmonary disease) ([place])   . Coronary artery disease, non-occlusive    a. cath 2006: min nonobs CAD; b. cath 12/2010: cath LAD 50%, RCA 60%; c. 08/2013: Minimal luminal irregs, right dominant system with no significant CAD, diffuse luminal irregs noted. Normal EF 55%, no AS or MS.   . Diabetes mellitus   . Hyperlipemia    Followed by Dr. Rosanna Randy  . Hypertension   . Lung cancer ([place])   . Macular degeneration    rt  . Personal  history of tobacco use, presenting hazards to health 10/15/2015  . Pneumonia    hx  . Shortness of breath   . Stroke [facility])     PAST SURGICAL HISTORY:   Past Surgical History:  Procedure Laterality Date  . ABDOMINAL HYSTERECTOMY    . ANTERIOR CERVICAL DECOMP/DISCECTOMY FUSION N/A 10/19/2013   Procedure: CERVICAL FIVE-SIX ANTERIOR CERVICAL DECOMPRESSION WITH FUSION INTERBODY PROSTHESIS PLATING AND PEEK CAGE;  Surgeon: Ophelia Charter, MD;  Location: [facility] NEURO ORS;  Service: Neurosurgery;  Laterality: N/A;  . BACK SURGERY  80's  . BREAST CYST EXCISION Left    left negative   . CARDIAC CATHETERIZATION  05/2004  . CATARACT EXTRACTION Left   . CHOLECYSTECTOMY    . ENDARTERECTOMY Left 10/24/2014   Procedure: ENDARTERECTOMY CAROTID;  Surgeon: Algernon Huxley, MD;  Location: [facility] ORS;  Service: Vascular;  Laterality: Left;  . ENDOBRONCHIAL ULTRASOUND N/A 11/14/2015   Procedure: ENDOBRONCHIAL ULTRASOUND;  Surgeon: [name], MD;  Location: [facility] ORS;  Service: Cardiopulmonary;  Laterality: N/A;  . PERIPHERAL VASCULAR CATHETERIZATION N/A 12/04/2015   Procedure: Glori Luis Cath Insertion;  Surgeon: Algernon Huxley, MD;  Location: [facility] CV LAB;  Service: Cardiovascular;  Laterality: N/A;  . TONSILLECTOMY AND ADENOIDECTOMY    . VESICOVAGINAL FISTULA CLOSURE W/ TAH      SOCIAL HISTORY:   Social History  Substance Use Topics  . Smoking status: Former Smoker    Packs/day: 1.00    Years: 50.00    Types: Cigarettes    Quit date: 10/03/2015  .  Smokeless tobacco: Never Used     Comment: smokes 3 cigs daily 05/06/15. Pt instructed to quit.  . Alcohol use No    FAMILY HISTORY:   Family History  Problem Relation Age of Onset  . Stroke Mother     Massive  . Heart attack Father     Massive  . Heart attack Brother   . Heart attack Brother   . Lung cancer Maternal Grandfather   . Heart attack Paternal Grandmother     MI    DRUG ALLERGIES:   Allergies  Allergen Reactions  . Coconut Fatty  Acids Swelling    Throat swells    REVIEW OF SYSTEMS:   Review of Systems  Constitutional: Negative for fever and weight loss.  HENT: Negative for congestion, nosebleeds and tinnitus.   Eyes: Negative for blurred vision, double vision and redness.  Respiratory: Negative for cough, hemoptysis and shortness of breath.   Cardiovascular: Negative for chest pain, orthopnea, leg swelling and PND.  Gastrointestinal: Positive for nausea and vomiting. Negative for abdominal pain, diarrhea and melena.  Genitourinary: Negative for dysuria, hematuria and urgency.  Musculoskeletal: Negative for falls and joint pain.  Neurological: Negative for dizziness, tingling, sensory change, focal weakness, seizures, weakness and headaches.  Endo/Heme/Allergies: Negative for polydipsia. Does not bruise/bleed easily.  Psychiatric/Behavioral: Negative for depression and memory loss. The patient is not nervous/anxious.     MEDICATIONS AT HOME:   Prior to Admission medications   Medication Sig Start Date End Date Taking? Authorizing Provider  albuterol (PROVENTIL) (5 MG/ML) 0.5% nebulizer solution Take 2.5 mg by nebulization 2 (two) times daily.    Historical Provider, MD  aspirin 81 MG tablet Take 81 mg by mouth every morning.     Historical Provider, MD  atorvastatin (LIPITOR) 20 MG tablet Take 1 tablet by mouth at  bedtime 09/04/15   Jerrol Banana., MD  budesonide-formoterol [facility]) 160-4.5 MCG/ACT inhaler Inhale 2 puffs into the lungs 2 (two) times daily. 12/06/14   [name], MD  clopidogrel (PLAVIX) 75 MG tablet Take 75 mg by mouth once.  11/23/15   Historical Provider, MD  diazepam (VALIUM) 5 MG tablet Take 1 tablet (5 mg total) by mouth every 6 (six) hours as needed for muscle spasms. 10/20/13   Newman Pies, MD  gabapentin (NEURONTIN) 600 MG tablet Take 1 tablet by mouth two  times daily 06/06/15   Jerrol Banana., MD  glimepiride (AMARYL) 4 MG tablet Take 1 tablet by mouth two  times daily  04/18/15   Jerrol Banana., MD  isosorbide mononitrate (IMDUR) 30 MG 24 hr tablet Take 1 tablet by mouth  daily 09/04/15   [name], MD  lidocaine-prilocaine (EMLA) cream Apply to port 1-2 hours prior to chemotherapy appointment. Cover with plastic wrap. 12/05/15   Lloyd Huger, MD  losartan-hydrochlorothiazide [facility]) 100-25 MG tablet Take 1 tablet by mouth  daily 09/04/15   Jerrol Banana., MD  magnesium oxide (MAG-OX) 400 (241.3 MG) MG tablet Take 1 tablet by mouth 2 (two) times daily. 08/04/13   Historical Provider, MD  metFORMIN (GLUCOPHAGE) 1000 MG tablet Take 1 tablet by mouth  every day 10/10/15   Jerrol Banana., MD  Multiple Vitamins-Minerals (ICAPS) CAPS Take 2 capsules by mouth daily. Reported on 04/17/2015    Historical Provider, MD  naproxen sodium (ANAPROX) 220 MG tablet Take 220 mg by mouth as needed. Takes twice a week as needed.    Historical Provider,  MD  nitroGLYCERIN (NITROSTAT) 0.4 MG SL tablet Place 1 tablet (0.4 mg total) under the tongue every 5 (five) minutes as needed for chest pain. 07/23/14   [name], MD  ondansetron (ZOFRAN) 8 MG tablet Take 1 tablet (8 mg total) by mouth 2 (two) times daily as needed. 12/05/15   Lloyd Huger, MD  oxymetazoline (NO DRIP NASAL SPRAY) 0.05 % nasal spray Place 1 spray into both nostrils 2 (two) times daily.    Historical Provider, MD  pantoprazole (PROTONIX) 40 MG tablet Take 1 tablet by mouth  daily 09/04/15   Jerrol Banana., MD  pioglitazone (ACTOS) 30 MG tablet Take 1 tablet (30 mg total) by mouth daily. 05/06/15   Richard Maceo Pro., MD  potassium chloride (K-DUR) 10 MEQ tablet Take 3 tablets by mouth  daily as directed 01/11/15   Jerrol Banana., MD  prochlorperazine (COMPAZINE) 10 MG tablet Take 1 tablet (10 mg total) by mouth every 6 (six) hours as needed (Nausea or vomiting). 12/05/15   Lloyd Huger, MD      VITAL SIGNS:  Blood pressure (!) 107/39, pulse 75, temperature 98.5  F (36.9 C), temperature source Oral, resp. rate 18, SpO2 99 %.  PHYSICAL EXAMINATION:  Physical Exam  GENERAL:  75 y.o.-year-old patient lying in bed in no acute distress.  EYES: Pupils equal, round, reactive to light and accommodation. No scleral icterus. Extraocular muscles intact.  HEENT: Head atraumatic, normocephalic. Oropharynx and nasopharynx clear. No oropharyngeal erythema, dry oral mucosa  NECK:  Supple, no jugular venous distention. No thyroid enlargement, no tenderness.  LUNGS: Normal breath sounds bilaterally, no wheezing, rales, rhonchi. No use of accessory muscles of respiration.  CARDIOVASCULAR: S1, S2 RRR. No murmurs, rubs, gallops, clicks.  ABDOMEN: Soft, nontender, nondistended. Bowel sounds present. No organomegaly or mass.  EXTREMITIES: No pedal edema, cyanosis, or clubbing. + 2 pedal & radial pulses b/l.   NEUROLOGIC: Cranial nerves II through XII are intact. No focal Motor or sensory deficits appreciated b/l.  Globally weak.  PSYCHIATRIC: The patient is alert and oriented x 3. Good affect.  SKIN: No obvious rash, lesion, or ulcer.   LABORATORY PANEL:   CBC  Recent Labs Lab 02/05/16 1102  WBC 3.1*  HGB 7.7*  HCT 22.2*  PLT 75*   ------------------------------------------------------------------------------------------------------------------  Chemistries   Recent Labs Lab 02/05/16 1102  NA 132*  K 4.5  CL 91*  CO2 27  GLUCOSE 282*  BUN 83*  CREATININE 3.27*  CALCIUM 8.6*  MG 1.9  AST 19  ALT 15  ALKPHOS 72  BILITOT 0.7   ------------------------------------------------------------------------------------------------------------------  Cardiac Enzymes No results for input(s): TROPONINI in the last 168 hours. ------------------------------------------------------------------------------------------------------------------  RADIOLOGY:  No results found.   IMPRESSION AND PLAN:   75 year old female with past medical history of CVA,  COPD, lung cancer, diabetes, coronary artery disease, macular degeneration who presents to the hospital due to intractable nausea vomiting and noted to be in acute renal failure.   1. Acute kidney injury-secondary to persistent nausea vomiting and dehydration. -I will hydrate her with IV fluids, follow BUN/creatinine. Renal dose meds avoid nephrotoxins. -Baseline creatinine is around 1-1.2 now currently elevated 3.2. -I will also get a nephrology consult, get a renal ultrasound.  2. Intractable N/V - due to chemo.  Clear liquid diet.  - supportive care w/ IV fluids, anti-emetics and will monitor.   3. Acute on chronic anemia-secondary to chemotherapy. -Patient is symptomatic, and therefore I'll transfuse her 1  unit of packed red blood cells. Follow hemoglobin.  4. COPD-no acute exacerbation. Place on when necessary DuoNeb's.  5. Diabetes-Place on sliding scale insulin, and follow blood sugars.  6. Leukopenia/thrombocytopenia-secondary to the chemotherapy. -Follow serial counts.     All the records are reviewed and case discussed with ED provider. Management plans discussed with the patient, family and they are in agreement.  CODE STATUS: Full Code  TOTAL TIME TAKING CARE OF THIS PATIENT: 45 minutes.    Henreitta Leber M.D on 02/05/2016 at 3:13 PM  Between 7am to 6pm - Pager - [phone]  After 6pm go to [email or web address] - password [email or web address] [facility]  Office  [phone]  CC: Primary care physician; [name], MD

## 2016-02-05 NOTE — Telephone Encounter (Signed)
Called to report that she is dehydrated and is vomiting and cannot walk and needs something done right now. Per Dr [name] asking what her Blood Sugar is this morning, she may need to go to ER if it is elevated again. I asked husband what her sugar is this morning and he kept saying she is not doing good this morning and asked if her sugar had been checked and the response was no. I advised him to check it ans if over 300 to take her to the ER and to call me back and let me know. He called back to report her sugar is 295. Per Dr [name] can bring her in for IVF and lab. He states he will bring her in as soon as they are dressed

## 2016-02-06 LAB — BASIC METABOLIC PANEL
ANION GAP: 7 (ref 5–15)
BUN: 70 mg/dL — ABNORMAL HIGH (ref 6–20)
CALCIUM: 7.8 mg/dL — AB (ref 8.9–10.3)
CO2: 29 mmol/L (ref 22–32)
Chloride: 100 mmol/L — ABNORMAL LOW (ref 101–111)
Creatinine, Ser: 2.71 mg/dL — ABNORMAL HIGH (ref 0.44–1.00)
GFR calc Af Amer: 19 mL/min — ABNORMAL LOW (ref >60)
GFR calc non Af Amer: 16 mL/min — ABNORMAL LOW (ref >60)
GLUCOSE: 145 mg/dL — AB (ref 65–99)
Potassium: 4.1 mmol/L (ref 3.5–5.1)
Sodium: 136 mmol/L (ref 135–145)

## 2016-02-06 LAB — GLUCOSE, CAPILLARY
GLUCOSE-CAPILLARY: 133 mg/dL — AB (ref 65–99)
GLUCOSE-CAPILLARY: 183 mg/dL — AB (ref 65–99)
GLUCOSE-CAPILLARY: 198 mg/dL — AB (ref 65–99)
Glucose-Capillary: 132 mg/dL — ABNORMAL HIGH (ref 65–99)

## 2016-02-06 LAB — CBC
HCT: 23.9 % — ABNORMAL LOW (ref 35.0–47.0)
HEMOGLOBIN: 8.2 g/dL — AB (ref 12.0–16.0)
MCH: 31.8 pg (ref 26.0–34.0)
MCHC: 34.6 g/dL (ref 32.0–36.0)
MCV: 92.1 fL (ref 80.0–100.0)
Platelets: 51 10*3/uL — ABNORMAL LOW (ref 150–440)
RBC: 2.59 MIL/uL — ABNORMAL LOW (ref 3.80–5.20)
RDW: 18.9 % — ABNORMAL HIGH (ref 11.5–14.5)
WBC: 2.2 10*3/uL — ABNORMAL LOW (ref 3.6–11.0)

## 2016-02-06 MED ORDER — GABAPENTIN 600 MG PO TABS
300.0000 mg | ORAL_TABLET | Freq: Two times a day (BID) | ORAL | Status: DC
  Administered 2016-02-06 – 2016-02-13 (×13): 300 mg via ORAL
  Filled 2016-02-06 (×14): qty 1

## 2016-02-06 MED ORDER — NEPRO/CARBSTEADY PO LIQD
237.0000 mL | Freq: Two times a day (BID) | ORAL | Status: DC
  Administered 2016-02-06 – 2016-02-07 (×3): 237 mL via ORAL

## 2016-02-06 MED ORDER — ONDANSETRON HCL 4 MG/2ML IJ SOLN
4.0000 mg | Freq: Four times a day (QID) | INTRAMUSCULAR | Status: AC
  Administered 2016-02-06 – 2016-02-08 (×7): 4 mg via INTRAVENOUS
  Filled 2016-02-06 (×7): qty 2

## 2016-02-06 NOTE — Care Management Important Message (Signed)
Important Message  Patient Details  Name: [name] [name] MRN: [id number] Date of Birth: Jan 22, 1941   Medicare Important Message Given:  Yes    Shelbie Ammons, RN 02/06/2016, 9:02 AM

## 2016-02-06 NOTE — Progress Notes (Signed)
[facility] [place] NAME: [name] [name]    MR#:  [id number]  DATE OF BIRTH:  Jan 23, 1941  SUBJECTIVE:  CHIEF COMPLAINT:  No chief complaint on file.  - admitted with weakness, nausea, vomiting. Labs with renal failure - receiving IV fluids, feels only Some better. Still nauseous  REVIEW OF SYSTEMS:  Review of Systems  Constitutional: Positive for malaise/fatigue. Negative for chills and fever.  HENT: Negative for ear discharge, ear pain, nosebleeds and tinnitus.   Respiratory: Negative for cough, shortness of breath and wheezing.   Cardiovascular: Negative for chest pain and palpitations.  Gastrointestinal: Positive for nausea. Negative for abdominal pain, constipation, diarrhea and vomiting.  Genitourinary: Negative for dysuria and urgency.  Musculoskeletal: Negative for myalgias.  Neurological: Negative for dizziness, sensory change, speech change, focal weakness, seizures and headaches.  Psychiatric/Behavioral: Negative for depression.    DRUG ALLERGIES:   Allergies  Allergen Reactions  . Coconut Fatty Acids Swelling    Throat swells    VITALS:  Blood pressure (!) 113/46, pulse 79, temperature 98.8 F (37.1 C), temperature source Oral, resp. rate 20, height '5\' 4"'$  (1.626 m), weight 69.8 kg (153 lb 14.4 oz), SpO2 96 %.  PHYSICAL EXAMINATION:  Physical Exam  GENERAL:  75 y.o.-year-old patient lying in the bed with no acute distress.  EYES: Pupils equal, round, reactive to light and accommodation. No scleral icterus. Extraocular muscles intact.  HEENT: Head atraumatic, normocephalic. Oropharynx and nasopharynx clear.  NECK:  Supple, no jugular venous distention. No thyroid enlargement, no tenderness.  LUNGS: Normal breath sounds bilaterally, no wheezing, rales,rhonchi or crepitation. No use of accessory muscles of respiration. Diminished breath sounds at the bases CARDIOVASCULAR: S1, S2 normal. No  rubs, or gallops. 2/6 systolic  murmur is present ABDOMEN: Soft, nontender, nondistended. Bowel sounds present. No organomegaly or mass.  EXTREMITIES: No pedal edema, cyanosis, or clubbing.  NEUROLOGIC: Cranial nerves II through XII are intact. Muscle strength 5/5 in all extremities. Sensation intact. Gait not checked.  PSYCHIATRIC: The patient is alert and oriented x 3.  SKIN: No obvious rash, lesion, or ulcer.    LABORATORY PANEL:   CBC  Recent Labs Lab 02/06/16 0550  WBC 2.2*  HGB 8.2*  HCT 23.9*  PLT 51*   ------------------------------------------------------------------------------------------------------------------  Chemistries   Recent Labs Lab 02/05/16 1102 02/06/16 0550  NA 132* 136  K 4.5 4.1  CL 91* 100*  CO2 27 29  GLUCOSE 282* 145*  BUN 83* 70*  CREATININE 3.27* 2.71*  CALCIUM 8.6* 7.8*  MG 1.9  --   AST 19  --   ALT 15  --   ALKPHOS 72  --   BILITOT 0.7  --    ------------------------------------------------------------------------------------------------------------------  Cardiac Enzymes No results for input(s): TROPONINI in the last 168 hours. ------------------------------------------------------------------------------------------------------------------  RADIOLOGY:  US Renal  Result Date: 02/05/2016 CLINICAL DATA:  Acute renal failure EXAM: RENAL / URINARY TRACT ULTRASOUND COMPLETE COMPARISON:  PET-CT October 23, 2015 FINDINGS: Right Kidney: Length: 10.6 cm. Echogenicity and renal cortical thickness are within normal limits. No mass, perinephric fluid, or hydronephrosis visualized. There is no sonographically demonstrable calculus or ureterectasis. Left Kidney: Length: 10.1 cm. Echogenicity and renal cortical thickness are within normal limits. No mass, perinephric fluid, or hydronephrosis visualized. There is no sonographically demonstrable calculus or ureterectasis. Bladder: Appears normal for degree of bladder distention. IMPRESSION: Study within normal limits. Electronically  Signed   By: Lowella Grip III M.D.   On: 02/05/2016 16:00  EKG:   Orders placed or performed in visit on 10/29/15  . EKG 12-Lead    ASSESSMENT AND PLAN:   75 year old female with past medical history of CVA, COPD, lung cancer, diabetes, coronary artery disease, macular degeneration who presents to the hospital due to intractable nausea vomiting and noted to be in acute renal failure.   1. Acute kidney injury-secondary to persistent nausea vomiting and dehydration. Prerenal causes - baseline cr of 1, now improved to 2.7 since admission - appreciate nephrology consult, continue IV fluids, avoid nephrotoxins  2. Intractable N/V - due to chemo. Advance diet as tolerated - supportive care w/ IV fluids, anti-emetics and will monitor.   3. Acute on chronic anemia-secondary to chemotherapy. -Received 1 unit packed RBC transfusion yesterday on admission. Hemoglobin is at 8.2 today. Continue to monitor.  4. 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 Finished radiation, on cycle 3 of chemotherapy. -Follow up with oncology as outpatient after discharge.  5. Pancytopenia-with Leukopenia/thrombocytopenia-secondary to the chemotherapy. -Follow serial counts.  Acute indication for platelet transfusion unless actively bleeding or less than 20 K  6. Hypertension-continue Imdur  7. DVT prophylaxis-Ted's and SCDs. Hold off on heparin products due to anemia and thrombocytopenia   Physical therapy worked with the patient and recommended rehabilitation.    All the records are reviewed and case discussed with Care Management/Social Workerr. Management plans discussed with the patient, family and they are in agreement.  CODE STATUS: Full code  TOTAL TIME TAKING CARE OF THIS PATIENT: 37 minutes.   POSSIBLE D/C IN 1-2 DAYS, DEPENDING ON CLINICAL CONDITION.   Gladstone Lighter M.D on 02/06/2016 at 2:15 PM  Between 7am to 6pm - Pager - [phone]  After  6pm go to [email or web address] - password [email or web address]  Office  [phone]  CC: Primary care physician; [name], MD

## 2016-02-06 NOTE — Consult Note (Signed)
Date: 02/06/2016                  Patient Name:  [name] [name]  MRN: [id number]  DOB: 1940/11/25  Age / Sex: 75 y.o., female         PCP: [name], MD                 Service Requesting Consult: Internal medicine                  Reason for Consult: Acute renal failure             History of Present Illness: Patient is a 75 y.o. female with medical problems of Diabetes, COPD, hypertension, lung cancer treated with radiation and chemotherapy, macular degeneration, previous history of stroke, who was admitted to [facility] on 02/05/2016 for evaluation of vomiting, dehydration, severe weakness. See her to the chief was referred for direct admission by oncologist. She received chemotherapy on 10/24 to 26. Then she developed intractable nausea and vomiting and oral intake was very poor. Lab results showed acute renal failure. Creatinine was 3.27. He was started on IV hydration. It has resulted in improvement of her creatinine to 2.71  She denies any other complaints. Her appetite is still poor. She still feels nauseous and is only tolerating clears.   Medications: Outpatient medications: Prescriptions Prior to Admission  Medication Sig Dispense Refill Last Dose  . albuterol (PROVENTIL) (5 MG/ML) 0.5% nebulizer solution Take 2.5 mg by nebulization 2 (two) times daily.   02/04/2016 at Unknown time  . aspirin 81 MG tablet Take 81 mg by mouth every morning.    02/04/2016 at Unknown time  . atorvastatin (LIPITOR) 20 MG tablet Take 1 tablet by mouth at  bedtime 90 tablet 3 02/04/2016 at pm  . budesonide-formoterol (SYMBICORT) 160-4.5 MCG/ACT inhaler Inhale 2 puffs into the lungs 2 (two) times daily. 1 Inhaler 0 02/04/2016 at pm  . clopidogrel (PLAVIX) 75 MG tablet Take 75 mg by mouth once.    02/04/2016 at Unknown time  . diazepam (VALIUM) 5 MG tablet Take 1 tablet (5 mg total) by mouth every 6 (six) hours as needed for muscle spasms. 50 tablet 1 prn at prn  . gabapentin (NEURONTIN) 600 MG  tablet Take 1 tablet by mouth two  times daily 180 tablet 3 02/04/2016 at pm  . glimepiride (AMARYL) 4 MG tablet Take 1 tablet by mouth two  times daily 180 tablet 3 02/04/2016 at pm  . isosorbide mononitrate (IMDUR) 30 MG 24 hr tablet Take 1 tablet by mouth  daily 90 tablet 3 02/04/2016 at am  . lidocaine-prilocaine (EMLA) cream Apply to port 1-2 hours prior to chemotherapy appointment. Cover with plastic wrap. 90 g 3 02/04/2016 at Unknown time  . losartan-hydrochlorothiazide (HYZAAR) 100-25 MG tablet Take 1 tablet by mouth  daily 90 tablet 3 02/04/2016 at am  . magnesium oxide (MAG-OX) 400 (241.3 MG) MG tablet Take 1 tablet by mouth 2 (two) times daily.   02/04/2016 at Unknown time  . metFORMIN (GLUCOPHAGE) 1000 MG tablet Take 1 tablet by mouth  every day 90 tablet 3 02/04/2016 at am  . Multiple Vitamins-Minerals (ICAPS) CAPS Take 2 capsules by mouth daily. Reported on 04/17/2015   02/04/2016 at Unknown time  . naproxen sodium (ANAPROX) 220 MG tablet Take 220 mg by mouth as needed. Takes twice a week as needed.   02/04/2016 at Unknown time  . nitroGLYCERIN (NITROSTAT) 0.4 MG SL tablet Place 1  tablet (0.4 mg total) under the tongue every 5 (five) minutes as needed for chest pain. 25 tablet 3 prn at prn  . ondansetron (ZOFRAN) 8 MG tablet Take 1 tablet (8 mg total) by mouth 2 (two) times daily as needed. 30 tablet 1 prn at prn  . pantoprazole (PROTONIX) 40 MG tablet Take 1 tablet by mouth  daily 90 tablet 3 02/04/2016 at am  . pioglitazone (ACTOS) 30 MG tablet Take 1 tablet (30 mg total) by mouth daily. 90 tablet 3 02/04/2016 at am  . potassium chloride (K-DUR) 10 MEQ tablet Take 3 tablets by mouth  daily as directed (Patient taking differently: Take 1 tablet by mouth 3 times daily) 270 tablet 1 02/04/2016 at am  . prochlorperazine (COMPAZINE) 10 MG tablet Take 1 tablet (10 mg total) by mouth every 6 (six) hours as needed (Nausea or vomiting). 30 tablet 1 prn at prn    Current medications: Current  Facility-Administered Medications  Medication Dose Route Frequency Provider Last Rate Last Dose  . 0.9 %  sodium chloride infusion   Intravenous Continuous Henreitta Leber, MD 100 mL/hr at 02/06/16 1032    . acetaminophen (TYLENOL) tablet 650 mg  650 mg Oral Q6H PRN Henreitta Leber, MD       Or  . acetaminophen (TYLENOL) suppository 650 mg  650 mg Rectal Q6H PRN Henreitta Leber, MD      . aspirin EC tablet 81 mg  81 mg Oral BH-q7a Henreitta Leber, MD   81 mg at 02/06/16 1033  . atorvastatin (LIPITOR) tablet 20 mg  20 mg Oral QHS Henreitta Leber, MD   20 mg at 02/05/16 2208  . diazepam (VALIUM) tablet 5 mg  5 mg Oral Q6H PRN Henreitta Leber, MD      . feeding supplement (NEPRO CARB STEADY) liquid 237 mL  237 mL Oral BID BM Emerson Barretto, MD      . gabapentin (NEURONTIN) tablet 600 mg  600 mg Oral BID Henreitta Leber, MD   600 mg at 02/06/16 1033  . insulin aspart (novoLOG) injection 0-5 Units  0-5 Units Subcutaneous QHS Henreitta Leber, MD      . insulin aspart (novoLOG) injection 0-9 Units  0-9 Units Subcutaneous TID WC Henreitta Leber, MD   1 Units at 02/06/16 0802  . ipratropium-albuterol (DUONEB) 0.5-2.5 (3) MG/3ML nebulizer solution 3 mL  3 mL Nebulization Q6H PRN Henreitta Leber, MD      . isosorbide mononitrate (IMDUR) 24 hr tablet 30 mg  30 mg Oral Daily Henreitta Leber, MD   30 mg at 02/06/16 1033  . mometasone-formoterol (DULERA) 200-5 MCG/ACT inhaler 2 puff  2 puff Inhalation BID Henreitta Leber, MD   2 puff at 02/06/16 0802  . nitroGLYCERIN (NITROSTAT) SL tablet 0.4 mg  0.4 mg Sublingual Q5 min PRN Henreitta Leber, MD      . ondansetron [facility]) tablet 4 mg  4 mg Oral Q6H PRN Henreitta Leber, MD       Or  . ondansetron (ZOFRAN) injection 4 mg  4 mg Intravenous Q6H PRN Henreitta Leber, MD   4 mg at 02/05/16 1741  . pantoprazole (PROTONIX) EC tablet 40 mg  40 mg Oral Daily Henreitta Leber, MD   40 mg at 02/06/16 1033  . promethazine (PHENERGAN) injection 12.5 mg  12.5 mg Intravenous Q6H  PRN Henreitta Leber, MD       Facility-Administered Medications Ordered in  Other Encounters  Medication Dose Route Frequency Provider Last Rate Last Dose  . ondansetron (ZOFRAN) 8 mg, dexamethasone (DECADRON) 10 mg in sodium chloride 0.9 % 50 mL IVPB   Intravenous Once Lloyd Huger, MD          Allergies: Allergies  Allergen Reactions  . Coconut Fatty Acids Swelling    Throat swells      Past Medical History: Past Medical History:  Diagnosis Date  . Abnormal CT lung screening 10/17/2015  . COPD (chronic obstructive pulmonary disease) ([place])   . Coronary artery disease, non-occlusive    a. cath 2006: min nonobs CAD; b. cath 12/2010: cath LAD 50%, RCA 60%; c. 08/2013: Minimal luminal irregs, right dominant system with no significant CAD, diffuse luminal irregs noted. Normal EF 55%, no AS or MS.   . Diabetes mellitus   . Hyperlipemia    Followed by Dr. Rosanna Randy  . Hypertension   . Lung cancer ([place])   . Macular degeneration    rt  . Personal history of tobacco use, presenting hazards to health 10/15/2015  . Pneumonia    hx  . Shortness of breath   . Stroke [facility])      Past Surgical History: Past Surgical History:  Procedure Laterality Date  . ABDOMINAL HYSTERECTOMY    . ANTERIOR CERVICAL DECOMP/DISCECTOMY FUSION N/A 10/19/2013   Procedure: CERVICAL FIVE-SIX ANTERIOR CERVICAL DECOMPRESSION WITH FUSION INTERBODY PROSTHESIS PLATING AND PEEK CAGE;  Surgeon: Ophelia Charter, MD;  Location: [place] [facility];  Service: Neurosurgery;  Laterality: N/A;  . BACK SURGERY  80's  . BREAST CYST EXCISION Left    left negative   . CARDIAC CATHETERIZATION  05/2004  . CATARACT EXTRACTION Left   . CHOLECYSTECTOMY    . ENDARTERECTOMY Left 10/24/2014   Procedure: ENDARTERECTOMY CAROTID;  Surgeon: Algernon Huxley, MD;  Location: [facility] ORS;  Service: Vascular;  Laterality: Left;  . ENDOBRONCHIAL ULTRASOUND N/A 11/14/2015   Procedure: ENDOBRONCHIAL ULTRASOUND;  Surgeon: [name], MD;  Location: [facility]  ORS;  Service: Cardiopulmonary;  Laterality: N/A;  . PERIPHERAL VASCULAR CATHETERIZATION N/A 12/04/2015   Procedure: Glori Luis Cath Insertion;  Surgeon: Algernon Huxley, MD;  Location: [place] CV LAB;  Service: Cardiovascular;  Laterality: N/A;  . TONSILLECTOMY AND ADENOIDECTOMY    . VESICOVAGINAL FISTULA CLOSURE W/ TAH       Family History: Family History  Problem Relation Age of Onset  . Stroke Mother     Massive  . Heart attack Father     Massive  . Heart attack Brother   . Heart attack Brother   . Lung cancer Maternal Grandfather   . Heart attack Paternal Grandmother     MI     Social History: Social History   Social History  . Marital status: Married    Spouse name: N/A  . Number of children: N/A  . Years of education: N/A   Occupational History  . Retired, Environmental consultant Retired   Social History Main Topics  . Smoking status: Former Smoker    Packs/day: 1.00    Years: 50.00    Types: Cigarettes    Quit date: 10/03/2015  . Smokeless tobacco: Never Used     Comment: smokes 3 cigs daily 05/06/15. Pt instructed to quit.  . Alcohol use No  . Drug use: No  . Sexual activity: No   Other Topics Concern  . Not on file   Social History Narrative   Married with 4 children   Gets regular  exercise     Review of Systems: Gen: No fevers or chills HEENT: No vision or hearing issues CV: No chest pain or shortness of breath Resp: No cough or sputum GI: Nausea, vomiting. Diarrhea. No blood in stool GU : Able to void without problem. No blood in the urine MS: No acute joint complaints. Baseline arthritis Derm:  No complaints Psych: No complaints Heme: No complaints Neuro: No complaints Endocrine. No complaints. Blood sugar was high at home  Vital Signs: Blood pressure (!) 113/46, pulse 79, temperature 98.8 F (37.1 C), temperature source Oral, resp. rate 20, height '5\' 4"'$  (1.626 m), weight 69.8 kg (153 lb 14.4 oz), SpO2 96 %.   Intake/Output Summary (Last 24  hours) at 02/06/16 1141 Last data filed at 02/06/16 0800  Gross per 24 hour  Intake          1543.66 ml  Output              200 ml  Net          1343.66 ml    Weight trends: [employer]   02/05/16 1511  Weight: 69.8 kg (153 lb 14.4 oz)    Physical Exam: General:  No acute distress, laying in the bed   HEENT Anicteric, moist oral mucous membranes   Neck:  Supple   Lungs: Normal breathing effort, clear to auscultation   Heart::  Regular, no rub or gallop   Abdomen: Soft, nontender, nondistended   Extremities:  No edema   Neurologic: Alert and oriented   Skin: No acute rashes   Access: Chest port           Lab results: Basic Metabolic Panel:  Recent Labs Lab 02/05/16 1102 02/06/16 0550  NA 132* 136  K 4.5 4.1  CL 91* 100*  CO2 27 29  GLUCOSE 282* 145*  BUN 83* 70*  CREATININE 3.27* 2.71*  CALCIUM 8.6* 7.8*  MG 1.9  --     Liver Function Tests:  Recent Labs Lab 02/05/16 1102  AST 19  ALT 15  ALKPHOS 72  BILITOT 0.7  PROT 6.3*  ALBUMIN 3.4*   No results for input(s): LIPASE, AMYLASE in the last 168 hours. No results for input(s): AMMONIA in the last 168 hours.  CBC:  Recent Labs Lab 02/05/16 1102 02/06/16 0550  WBC 3.1* 2.2*  NEUTROABS 2.7  --   HGB 7.7* 8.2*  HCT 22.2* 23.9*  MCV 95.5 92.1  PLT 75* 51*    Cardiac Enzymes: No results for input(s): CKTOTAL, TROPONINI in the last 168 hours.  BNP: Invalid input(s): POCBNP  CBG:  Recent Labs Lab 02/05/16 1632 02/05/16 2006 02/05/16 2207 02/06/16 0728 02/06/16 1125  GLUCAP 208* 185* 147* 133* 132*    Microbiology: Recent Results (from the past 720 hour(s))  Urine culture     Status: Abnormal   Collection Time: 01/21/16  1:45 PM  Result Value Ref Range Status   Specimen Description URINE, RANDOM  Final   Special Requests NONE  Final   Culture >=100,000 COLONIES/mL ESCHERICHIA COLI (A)  Final   Report Status 01/23/2016 FINAL  Final   Organism ID, Bacteria ESCHERICHIA COLI  (A)  Final      Susceptibility   Escherichia coli - MIC*    AMPICILLIN 4 SENSITIVE Sensitive     CEFAZOLIN <=4 SENSITIVE Sensitive     CEFTRIAXONE <=1 SENSITIVE Sensitive     CIPROFLOXACIN >=4 RESISTANT Resistant     GENTAMICIN <=1 SENSITIVE Sensitive  IMIPENEM <=0.25 SENSITIVE Sensitive     NITROFURANTOIN <=16 SENSITIVE Sensitive     TRIMETH/SULFA <=20 SENSITIVE Sensitive     AMPICILLIN/SULBACTAM <=2 SENSITIVE Sensitive     PIP/TAZO <=4 SENSITIVE Sensitive     Extended ESBL NEGATIVE Sensitive     * >=100,000 COLONIES/mL ESCHERICHIA COLI     Coagulation Studies: No results for input(s): LABPROT, INR in the last 72 hours.  Urinalysis: No results for input(s): COLORURINE, LABSPEC, PHURINE, GLUCOSEU, HGBUR, BILIRUBINUR, KETONESUR, PROTEINUR, UROBILINOGEN, NITRITE, LEUKOCYTESUR in the last 72 hours.  Invalid input(s): APPERANCEUR      Imaging: US Renal  Result Date: 02/05/2016 CLINICAL DATA:  Acute renal failure EXAM: RENAL / URINARY TRACT ULTRASOUND COMPLETE COMPARISON:  PET-CT October 23, 2015 FINDINGS: Right Kidney: Length: 10.6 cm. Echogenicity and renal cortical thickness are within normal limits. No mass, perinephric fluid, or hydronephrosis visualized. There is no sonographically demonstrable calculus or ureterectasis. Left Kidney: Length: 10.1 cm. Echogenicity and renal cortical thickness are within normal limits. No mass, perinephric fluid, or hydronephrosis visualized. There is no sonographically demonstrable calculus or ureterectasis. Bladder: Appears normal for degree of bladder distention. IMPRESSION: Study within normal limits. Electronically Signed   By: Lowella Grip III M.D.   On: 02/05/2016 16:00      Assessment & Plan: Pt is a 75 y.o. Caucasian female with Diabetes, COPD, hypertension, lung cancer treated with radiation and chemotherapy, macular degeneration, history of stroke, was admitted on 02/05/2016 with dehydration, nausea, vomiting postchemotherapy.    1. Acute renal failure Patient's baseline creatinine is 1.02, from 01/21/2016/GFR 53 Acute kidney injury is likely secondary to volume depletion and possibly ATN post-chemotherapy from nausea and vomiting Agree with IV hydration Continue to monitor electrolytes and renal function on a daily basis  Will follow

## 2016-02-06 NOTE — Care Management (Addendum)
Direct admit from [facility] yesterday with the diagnosis of acute renal failure. History of lung cancer. Lives with husband, [name], [phone]). Last chemotherapy was October 24-26 for lung cancer. Dr. Rosanna Randy is listed as primary care physician. Seen Dr. Rosanna Randy October 1st 2017.   BUN 70, Creatinine 2.71, WBC's 2.2 this morning. Clear liquid diet.  [facility] comes yearly for physical. Outpatient physical therapy 8 years ago. Home oxygen theough [employer] x 5 years. 3 liters at night, 2 liters during the day. Prescriptions are filled per [facility] [place]. 2 rolling walkers, 2 canes, wheelchair, and a chair that sits in the shower in the home. Takes care of all basic and instrumental activities of daily living herself, drives. Lost 15-20 pounds in the last couple of weeks. No falls. Husband will transport. Shelbie Ammons RN MSN CCM Care Management [phone]

## 2016-02-06 NOTE — Plan of Care (Signed)
Problem: Physical Regulation: Goal: Ability to maintain clinical measurements within normal limits will improve Outcome: Progressing Enc to be oob more  Problem: Nutrition: Goal: Adequate nutrition will be maintained Outcome: Progressing Still with some nausea md added  zofran q6hrs atc  Problem: Nutrition: Goal: Adequate nutrition will be maintained Outcome: Progressing Clear liqs

## 2016-02-06 NOTE — Progress Notes (Signed)
Inpatient Diabetes Program Recommendations  AACE/ADA: New Consensus Statement on Inpatient Glycemic Control (2015)  Target Ranges:  Prepandial:   less than 140 mg/dL      Peak postprandial:   less than 180 mg/dL (1-2 hours)      Critically ill patients:  140 - 180 mg/dL   Lab Results  Component Value Date   GLUCAP 133 (H) 02/06/2016   HGBA1C 9.8 01/09/2016    Review of Glycemic Control  Results for [name] [name], [name] [name] (MRN [id number]) as of 02/06/2016 09:38  Ref. Range 02/05/2016 16:32 02/05/2016 20:06 02/05/2016 22:07 02/06/2016 07:28  Glucose-Capillary Latest Ref Range: 65 - 99 mg/dL 208 (H) 185 (H) 147 (H) 133 (H)    Diabetes history: Type 2 Outpatient Diabetes medications: Actos '30mg'$  q day, Amaryl '4mg'$  bid, Metformin '1000mg'$  qday  Current orders for Inpatient glycemic control: Novolog sensitive correction 0-9 units tid, Novolog 0-5 units qhs  Inpatient Diabetes Program Recommendations:   Agree with current medications for blood sugar management.   Gentry Fitz, RN, BA, MHA, CDE Diabetes Coordinator Inpatient Glycemic Control Team [phone] (Team Pager) [phone] ([place]) 02/06/2016 9:42 AM

## 2016-02-06 NOTE — Progress Notes (Signed)
Initial Nutrition Assessment  DOCUMENTATION CODES:   Severe malnutrition in context of acute illness/injury  INTERVENTION:  -If unable to tolerate nepro would recommend Ensure Enlive po BID, each supplement provides 350 kcal and 20 grams of protein.  -Education provided regarding nutrition therapy for nausea, vomiting and ways to increase kcals and protein.  Handout given. Teachback used.    NUTRITION DIAGNOSIS:   Malnutrition related to acute illness as evidenced by percent weight loss, energy intake < or equal to 50% for > or equal to 5 days.    GOAL:   Patient will meet greater than or equal to 90% of their needs    MONITOR:   PO intake, Supplement acceptance  REASON FOR ASSESSMENT:   Malnutrition Screening Tool    ASSESSMENT:      75 y.o. Female admitted with ARF, nausea, vomiting, diarrhea. Chemotherapy given 01-27-09/26.  Pt with history CVA, COPD, lung cancer, DM, CAD, macular degeneration.  Pt reports she is hungry now, wanting to eat meatloaf for dinner tonight.  Reports for the past week she has only been able to eat bites secondary to nausea and vomiting Reports has been drinking ensure at home.   Medications reviewed: aspart insulin,  Protonix, NS at 132m/hr Labs reviewed: BUN 70, creatinine 2.71, glucose 145  Nutrition-Focused physical exam completed. Findings are WDL for fat depletion, muscle depletion, and edema.    Diet Order:  DIET SOFT Room service appropriate? Yes; Fluid consistency: Thin  Skin:  Reviewed, no issues  Last BM:  10/28  Height:   Ht Readings from Last 1 Encounters:  02/05/16 '5\' 4"'$  (1.626 m)    Weight: 8% wt loss in the last 2 months  Wt Readings from Last 1 Encounters:  02/05/16 153 lb 14.4 oz (69.8 kg)    Ideal Body Weight:     BMI:  Body mass index is 26.42 kg/m.  Estimated Nutritional Needs:   Kcal:  2100-2450 kcals/d  Protein:  84-105 g/d  Fluid:  >/= 2.1 L/d  EDUCATION NEEDS:   Education needs  addressed  Marciel Offenberger B. AZenia Resides RLoraine LGoldonna(pager) Weekend/On-Call pager [phone]

## 2016-02-06 NOTE — Evaluation (Signed)
Physical Therapy Evaluation Patient Details Name: [name] [name] MRN: [id number] DOB: June 26, 1940 Today's Date: 02/06/2016   History of Present Illness  [name] [name]  is a 75 y.o. female with a known history of Diabetes, COPD, hypertension, hyperlipidemia, lung cancer currently getting chemotherapy radiation, macular degeneration, history of previous CVA who presented to the hospital due to ongoing nausea vomiting and diarrhea and noted to be in acute renal failure. Patient is currently getting chemoradiation for her lung cancer and had her last chemotherapy treatment about a week ago. She says since this past Friday she is planning having persistent nausea vomiting and has not been able to keep anything down. She went to see her oncologist today at the [facility] was noted to be in acute kidney injury and noted to be acutely dehydrated. She was sent to the hospital for admission. She admits to intermittent abdominal pain associated with her nausea vomiting, but no documented fever, chills. She denies any chest pain, worsening shortness of breath, weight loss, hemoptysis or any other associated symptoms presently. Denies falls in the last 12 months  Clinical Impression  Pt admitted with above diagnosis. Pt currently with functional limitations due to the deficits listed below (see PT Problem List).  Pt is deconditioned and weak due to prolonged nausea, vomiting, and dehydration. She requires minA+1 for very short distance ambulation due to LE buckling and fatigue. Pt unable to ambulate farther than 25' today and would certainly be unable to ascend the 6 steps required to get into her house. Pt encouraged to perform bed exercises and ambulate regularly with staff today and throughout her admission. She currently is unable to return home due to weakness but if she can rehydrate, keep food down, and increase her activity level while admitted it is hopeful that she could return home. SNF placement would not be  ideal due to her chemotherapy schedule as well as risk for infection in additional healthcare facilities. RNA and CNA both notified and asked if they could ambulate patient a couple more times today. Pt will benefit from skilled PT services to address deficits in strength, balance, and mobility in order to return to full function at home.     Follow Up Recommendations SNF Pt is open to SNF placement. She currently is unable to return home due to weakness but if she can rehydrate, keep food down, and increase her activity level while admitted it is hopeful that she could return home. SNF placement would not be ideal due to her chemotherapy schedule as well as risk for infection in additional healthcare facilities. Will continue to monitor and hopefully be able to update discharge recommendations if she can make progress.      Equipment Recommendations  None recommended by PT;Other (comment) (Needs to use her RW at discharge)    Recommendations for Other Services       Precautions / Restrictions Precautions Precautions: Fall Precaution Comments: chemo/radiation precautions, double glove and gown Restrictions Weight Bearing Restrictions: No      Mobility  Bed Mobility Overal bed mobility: Needs Assistance Bed Mobility: Supine to Sit;Sit to Supine     Supine to sit: Supervision Sit to supine: Supervision   General bed mobility comments: Increased time required with HOB elevated and use of bed rails  Transfers Overall transfer level: Needs assistance Equipment used: Rolling walker (2 wheeled) Transfers: Sit to/from Stand Sit to Stand: Min guard         General transfer comment: Increased time to come to standing.  Relatively stable in standing with UE support on rolling walker. Pt initially reports feels "whoozy" so pt remains standing until symptoms pass  Ambulation/Gait Ambulation/Gait assistance: Min assist Ambulation Distance (Feet): 25 Feet Assistive device: Rolling walker  (2 wheeled) Gait Pattern/deviations: Decreased step length - right;Decreased step length - left Gait velocity: Decreased Gait velocity interpretation: <1.8 ft/sec, indicative of risk for recurrent falls General Gait Details: Pt ambulates 25' in room to bathroom door and then back to bed. Intermittent LE buckling during ambulation. SaO2>90% on room air throughout and pt denies DOE. Pt requires constant CGA to minA+1 due to buckling and poor strength. Gait is very slow and functional for very limited household Animal nutritionist [employer] (Stroke Patients Only)       Balance Overall balance assessment: Needs assistance Sitting-balance support: No upper extremity supported Sitting balance-Leahy Scale: Good Sitting balance - Comments: Able to tolerate challenge to sitting balance   Standing balance support: No upper extremity supported Standing balance-Leahy Scale: Fair Standing balance comment: More stable with UE assist but able to remain standing with feet apart without UE support                             Pertinent Vitals/Pain Pain Assessment: No/denies pain    Home Living Family/patient expects to be discharged to:: Private residence Living Arrangements: Spouse/significant other Available Help at Discharge: Family Type of Home: Mobile home Home Access: Stairs to enter Entrance Stairs-Rails: Can reach both Entrance Stairs-Number of Steps: 6 Home Layout: One level Home Equipment: Walker - 2 wheels;Cane - single point;Wheelchair - manual;Shower seat (No hospital bed or lift chair)      Prior Function Level of Independence: Needs assistance   Gait / Transfers Assistance Needed: Full community ambulator with rolling walker intermittently  ADL's / Homemaking Assistance Needed: Independent with ADLs, assist from husband with IADLs secondary to fatigue related to chemo        Hand Dominance   Dominant Hand:  Right    Extremity/Trunk Assessment   Upper Extremity Assessment: Overall WFL for tasks assessed           Lower Extremity Assessment: Generalized weakness         Communication   Communication: No difficulties  Cognition Arousal/Alertness: Awake/alert Behavior During Therapy: WFL for tasks assessed/performed Overall Cognitive Status: Within Functional Limits for tasks assessed                      General Comments      Exercises Other Exercises Other Exercises: Education provided to patient regarding bed exercises she can perform independently   Assessment/Plan    PT Assessment Patient needs continued PT services  PT Problem List Decreased strength;Decreased activity tolerance;Decreased balance;Decreased mobility;Decreased safety awareness          PT Treatment Interventions DME instruction;Gait training;Stair training;Therapeutic activities;Therapeutic exercise;Balance training;Neuromuscular re-education;Patient/family education    PT Goals (Current goals can be found in the Care Plan section)  Acute Rehab PT Goals Patient Stated Goal: "I just feel so weak" PT Goal Formulation: With patient Time For Goal Achievement: 02/20/16 Potential to Achieve Goals: Good    Frequency Min 2X/week   Barriers to discharge Inaccessible home environment Steps to enter, currently too weak to perform    Co-evaluation  End of Session Equipment Utilized During Treatment: Gait belt Activity Tolerance: Patient tolerated treatment well Patient left: in bed;with call bell/phone within reach;with bed alarm set Nurse Communication: Mobility status;Other (comment) (Please ambulate intermittently)         Time: 9872-1587 PT Time Calculation (min) (ACUTE ONLY): 22 min   Charges:   PT Evaluation $PT Eval Moderate Complexity: 1 Procedure     PT G Codes:       Traeh Milroy D Emiyah Spraggins PT, DPT   Lonn Im 02/06/2016, 10:20 AM

## 2016-02-07 ENCOUNTER — Other Ambulatory Visit: Payer: Self-pay | Admitting: Internal Medicine

## 2016-02-07 LAB — BASIC METABOLIC PANEL
ANION GAP: 8 (ref 5–15)
BUN: 55 mg/dL — ABNORMAL HIGH (ref 6–20)
CALCIUM: 7.3 mg/dL — AB (ref 8.9–10.3)
CO2: 28 mmol/L (ref 22–32)
Chloride: 102 mmol/L (ref 101–111)
Creatinine, Ser: 2.34 mg/dL — ABNORMAL HIGH (ref 0.44–1.00)
GFR, EST AFRICAN AMERICAN: 23 mL/min — AB (ref >60)
GFR, EST NON AFRICAN AMERICAN: 20 mL/min — AB (ref >60)
Glucose, Bld: 151 mg/dL — ABNORMAL HIGH (ref 65–99)
POTASSIUM: 3.6 mmol/L (ref 3.5–5.1)
SODIUM: 138 mmol/L (ref 135–145)

## 2016-02-07 LAB — CBC
HCT: 20.9 % — ABNORMAL LOW (ref 35.0–47.0)
Hemoglobin: 7.3 g/dL — ABNORMAL LOW (ref 12.0–16.0)
MCH: 32 pg (ref 26.0–34.0)
MCHC: 34.7 g/dL (ref 32.0–36.0)
MCV: 92 fL (ref 80.0–100.0)
PLATELETS: 28 10*3/uL — AB (ref 150–440)
RBC: 2.27 MIL/uL — AB (ref 3.80–5.20)
RDW: 18.9 % — AB (ref 11.5–14.5)
WBC: 1 10*3/uL — AB (ref 3.6–11.0)

## 2016-02-07 LAB — GLUCOSE, CAPILLARY
GLUCOSE-CAPILLARY: 154 mg/dL — AB (ref 65–99)
GLUCOSE-CAPILLARY: 208 mg/dL — AB (ref 65–99)
GLUCOSE-CAPILLARY: 189 mg/dL — AB (ref 65–99)
Glucose-Capillary: 189 mg/dL — ABNORMAL HIGH (ref 65–99)

## 2016-02-07 LAB — PREPARE RBC (CROSSMATCH)

## 2016-02-07 MED ORDER — TBO-FILGRASTIM 480 MCG/0.8ML ~~LOC~~ SOSY
480.0000 ug | PREFILLED_SYRINGE | Freq: Every day | SUBCUTANEOUS | Status: AC
  Administered 2016-02-07 – 2016-02-10 (×4): 480 ug via SUBCUTANEOUS
  Filled 2016-02-07 (×4): qty 0.8

## 2016-02-07 MED ORDER — TIOTROPIUM BROMIDE MONOHYDRATE 18 MCG IN CAPS
18.0000 ug | ORAL_CAPSULE | Freq: Every day | RESPIRATORY_TRACT | Status: DC
  Administered 2016-02-07 – 2016-02-13 (×7): 18 ug via RESPIRATORY_TRACT
  Filled 2016-02-07 (×2): qty 5

## 2016-02-07 MED ORDER — SODIUM CHLORIDE 0.9 % IV SOLN
Freq: Once | INTRAVENOUS | Status: AC
  Administered 2016-02-07: 12:00:00 via INTRAVENOUS

## 2016-02-07 NOTE — Progress Notes (Signed)
[facility]  ROUNDING NOTE   Subjective:   Laying in bed comfortably.  States she is feeling some what better. Tolerated PO intake.   NS at 154m/hr  Objective:  Vital signs in last 24 hours:  Temp:  [97.7 F (36.5 C)-99 F (37.2 C)] 99 F (37.2 C) (11/03 1454) Pulse Rate:  [75-86] 80 (11/03 1454) Resp:  [16-18] 16 (11/03 1454) BP: (122-135)/(47-92) 131/92 (11/03 1454) SpO2:  [93 %-98 %] 98 % (11/03 1454)  Weight change:  Filed Weights   02/05/16 1511  Weight: 69.8 kg (153 lb 14.4 oz)    Intake/Output: I/O last 3 completed shifts: In: 42751[P.O.:1120; I.V.:3091; Blood:328] Out: 200 [Urine:200]   Intake/Output this shift:  Total I/O In: 2222.5 [P.O.:480; I.V.:1215.5; Blood:290; NG/GT:237] Out: 0   Physical Exam: General: NAD,   Head: Normocephalic, atraumatic. Moist oral mucosal membranes  Eyes: Anicteric, PERRL  Neck: Supple, trachea midline  Lungs:  Clear to auscultation  Heart: Regular rate and rhythm  Abdomen:  Soft, nontender,   Extremities: no peripheral edema.  Neurologic: Nonfocal, moving all four extremities  Skin: No lesions       Basic Metabolic Panel:  Recent Labs Lab 02/05/16 1102 02/06/16 0550 02/07/16 0515  NA 132* 136 138  K 4.5 4.1 3.6  CL 91* 100* 102  CO2 '27 29 28  '$ GLUCOSE 282* 145* 151*  BUN 83* 70* 55*  CREATININE 3.27* 2.71* 2.34*  CALCIUM 8.6* 7.8* 7.3*  MG 1.9  --   --     Liver Function Tests:  Recent Labs Lab 02/05/16 1102  AST 19  ALT 15  ALKPHOS 72  BILITOT 0.7  PROT 6.3*  ALBUMIN 3.4*   No results for input(s): LIPASE, AMYLASE in the last 168 hours. No results for input(s): AMMONIA in the last 168 hours.  CBC:  Recent Labs Lab 02/05/16 1102 02/06/16 0550 02/07/16 0515  WBC 3.1* 2.2* 1.0*  NEUTROABS 2.7  --   --   HGB 7.7* 8.2* 7.3*  HCT 22.2* 23.9* 20.9*  MCV 95.5 92.1 92.0  PLT 75* 51* 28*    Cardiac Enzymes: No results for input(s): CKTOTAL, CKMB, CKMBINDEX, TROPONINI in the  last 168 hours.  BNP: Invalid input(s): POCBNP  CBG:  Recent Labs Lab 02/06/16 1125 02/06/16 1658 02/06/16 2040 02/07/16 0734 02/07/16 1142  GLUCAP 132* 183* 198* 154* 208*    Microbiology: Results for orders placed or performed during the hospital encounter of 01/21/16  Urine culture     Status: Abnormal   Collection Time: 01/21/16  1:45 PM  Result Value Ref Range Status   Specimen Description URINE, RANDOM  Final   Special Requests NONE  Final   Culture >=100,000 COLONIES/mL ESCHERICHIA COLI (A)  Final   Report Status 01/23/2016 FINAL  Final   Organism ID, Bacteria ESCHERICHIA COLI (A)  Final      Susceptibility   Escherichia coli - MIC*    AMPICILLIN 4 SENSITIVE Sensitive     CEFAZOLIN <=4 SENSITIVE Sensitive     CEFTRIAXONE <=1 SENSITIVE Sensitive     CIPROFLOXACIN >=4 RESISTANT Resistant     GENTAMICIN <=1 SENSITIVE Sensitive     IMIPENEM <=0.25 SENSITIVE Sensitive     NITROFURANTOIN <=16 SENSITIVE Sensitive     TRIMETH/SULFA <=20 SENSITIVE Sensitive     AMPICILLIN/SULBACTAM <=2 SENSITIVE Sensitive     PIP/TAZO <=4 SENSITIVE Sensitive     Extended ESBL NEGATIVE Sensitive     * >=100,000 COLONIES/mL ESCHERICHIA COLI    Coagulation  Studies: No results for input(s): LABPROT, INR in the last 72 hours.  Urinalysis: No results for input(s): COLORURINE, LABSPEC, PHURINE, GLUCOSEU, HGBUR, BILIRUBINUR, KETONESUR, PROTEINUR, UROBILINOGEN, NITRITE, LEUKOCYTESUR in the last 72 hours.  Invalid input(s): APPERANCEUR    Imaging: No results found.   Medications:   . sodium chloride 100 mL/hr at 02/07/16 0640   . aspirin EC  81 mg Oral BH-q7a  . atorvastatin  20 mg Oral QHS  . feeding supplement (NEPRO CARB STEADY)  237 mL Oral BID BM  . gabapentin  300 mg Oral BID  . insulin aspart  0-5 Units Subcutaneous QHS  . insulin aspart  0-9 Units Subcutaneous TID WC  . isosorbide mononitrate  30 mg Oral Daily  . mometasone-formoterol  2 puff Inhalation BID  .  ondansetron (ZOFRAN) IV  4 mg Intravenous Q6H  . pantoprazole  40 mg Oral Daily  . tiotropium  18 mcg Inhalation Daily   acetaminophen **OR** acetaminophen, diazepam, ipratropium-albuterol, nitroGLYCERIN, promethazine  Assessment/ Plan:  [name] is a 75 y.o. white female with Diabetes, COPD, hypertension, lung cancer treated with radiation and chemotherapy, macular degeneration, history of stroke, was admitted on 02/05/2016 with dehydration, nausea, vomiting postchemotherapy.   1. Acute renal failure on chronic kidney disease stage III: baseline creatinine is 1.02 from 01/21/2016/GFR 53 Acute kidney injury is likely secondary to volume depletion and possibly ATN post-chemotherapy from nausea and vomiting Agree with IV hydration Continue to monitor electrolytes and renal function on a daily basis - avoid NSAIDs including naproxen.   2. Hypertension: blood pressure at goal.  - holding losartan and hydrochlorothiazide due to acute renal failure.   3. Diabetes mellitus type II with chronic kidney disease: holding metformin due to renal failure - continue glucose control.    LOS: [place], [name] [name] 11/3/20174:06 PM

## 2016-02-07 NOTE — Progress Notes (Signed)
Paged and spoke with Dr. Marcille Blanco about pt critical value. WBC 1.0 and platelets 28. Per MD place pt on neutropenic precautions and continue to monitor.

## 2016-02-07 NOTE — Progress Notes (Signed)
[facility] [place] NAME: [name] [name]    MR#:  [id number]  DATE OF BIRTH:  02-05-1941  SUBJECTIVE:  CHIEF COMPLAINT:  No chief complaint on file.  - feels better than yesterday, nausea is better today - pancytopenia worsening on labs today  REVIEW OF SYSTEMS:  Review of Systems  Constitutional: Positive for malaise/fatigue. Negative for chills and fever.  HENT: Negative for ear discharge, ear pain, nosebleeds and tinnitus.   Respiratory: Negative for cough, shortness of breath and wheezing.   Cardiovascular: Negative for chest pain and palpitations.  Gastrointestinal: Negative for abdominal pain, constipation, diarrhea, nausea and vomiting.  Genitourinary: Negative for dysuria and urgency.  Musculoskeletal: Negative for myalgias.  Neurological: Negative for dizziness, sensory change, speech change, focal weakness, seizures and headaches.  Psychiatric/Behavioral: Negative for depression.    DRUG ALLERGIES:   Allergies  Allergen Reactions  . Coconut Fatty Acids Swelling    Throat swells    VITALS:  Blood pressure (!) 135/55, pulse 83, temperature 98.4 F (36.9 C), temperature source Oral, resp. rate 18, height '5\' 4"'$  (1.626 m), weight 69.8 kg (153 lb 14.4 oz), SpO2 95 %.  PHYSICAL EXAMINATION:  Physical Exam  GENERAL:  75 y.o.-year-old patient lying in the bed with no acute distress. Pale appearing. EYES: Pupils equal, round, reactive to light and accommodation. No scleral icterus. Extraocular muscles intact. Conjunctival pallor HEENT: Head atraumatic, normocephalic. Oropharynx and nasopharynx clear.  NECK:  Supple, no jugular venous distention. No thyroid enlargement, no tenderness.  LUNGS: Normal breath sounds bilaterally, no wheezing, rales,rhonchi or crepitation. No use of accessory muscles of respiration. Diminished breath sounds at the bases. Minimal exp wheeze on exam. CARDIOVASCULAR: S1, S2 normal. No  rubs, or gallops.  2/6 systolic murmur is present ABDOMEN: Soft, nontender, nondistended. Bowel sounds present. No organomegaly or mass.  EXTREMITIES: No pedal edema, cyanosis, or clubbing.  NEUROLOGIC: Cranial nerves II through XII are intact. Muscle strength 5/5 in all extremities. Sensation intact. Gait not checked.  PSYCHIATRIC: The patient is alert and oriented x 3.  SKIN: No obvious rash, lesion, or ulcer.    LABORATORY PANEL:   CBC  Recent Labs Lab 02/07/16 0515  WBC 1.0*  HGB 7.3*  HCT 20.9*  PLT 28*   ------------------------------------------------------------------------------------------------------------------  Chemistries   Recent Labs Lab 02/05/16 1102  02/07/16 0515  NA 132*  < > 138  K 4.5  < > 3.6  CL 91*  < > 102  CO2 27  < > 28  GLUCOSE 282*  < > 151*  BUN 83*  < > 55*  CREATININE 3.27*  < > 2.34*  CALCIUM 8.6*  < > 7.3*  MG 1.9  --   --   AST 19  --   --   ALT 15  --   --   ALKPHOS 72  --   --   BILITOT 0.7  --   --   < > = values in this interval not displayed. ------------------------------------------------------------------------------------------------------------------  Cardiac Enzymes No results for input(s): TROPONINI in the last 168 hours. ------------------------------------------------------------------------------------------------------------------  RADIOLOGY:  US Renal  Result Date: 02/05/2016 CLINICAL DATA:  Acute renal failure EXAM: RENAL / URINARY TRACT ULTRASOUND COMPLETE COMPARISON:  PET-CT October 23, 2015 FINDINGS: Right Kidney: Length: 10.6 cm. Echogenicity and renal cortical thickness are within normal limits. No mass, perinephric fluid, or hydronephrosis visualized. There is no sonographically demonstrable calculus or ureterectasis. Left Kidney: Length: 10.1 cm. Echogenicity and renal cortical thickness  are within normal limits. No mass, perinephric fluid, or hydronephrosis visualized. There is no sonographically demonstrable calculus or  ureterectasis. Bladder: Appears normal for degree of bladder distention. IMPRESSION: Study within normal limits. Electronically Signed   By: Lowella Grip III M.D.   On: 02/05/2016 16:00    EKG:   Orders placed or performed in visit on 10/29/15  . EKG 12-Lead    ASSESSMENT AND PLAN:   75 year old female with past medical history of CVA, COPD, lung cancer, diabetes, coronary artery disease, macular degeneration who presents to the hospital due to intractable nausea vomiting and noted to be in acute renal failure.   1. Acute kidney injury-secondary to persistent nausea vomiting and dehydration. Prerenal causes - baseline cr of 1, now improved to 2.3 since admission - appreciate nephrology consult, continue IV fluids, avoid nephrotoxins - may be Tx might help as well  2. Intractable N/V - due to chemo. Advance diet as tolerated - Improved symptoms today - supportive care w/ IV fluids, anti-emetics and will monitor.   3. Acute on chronic anemia-secondary to chemotherapy. -Received 1 unit packed RBC transfusion on admission. Hemoglobin Has improved and dropped again to 7.3 today' -We'll transfuse 1 more unit of packed RBC as her baseline is around 10 and that might help her kidney function as well  4. 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 Finished radiation, on cycle 3 of chemotherapy. -Follow up with oncology as outpatient after discharge. - cont inhalers and prn nebs for her wheezing  5. Pancytopenia-with Leukopenia/thrombocytopenia-secondary to the chemotherapy. -Counts further drops today. We'll check with oncology about possible Neulasta.  - no acute indication for platelet transfusion unless actively bleeding or less than 20 K -Monitor closely  6. Hypertension-continue Imdur  7. DVT prophylaxis-Ted's and SCDs. Hold off on heparin products due to anemia and thrombocytopenia   Physical therapy worked with the patient and recommended  rehabilitation.    All the records are reviewed and case discussed with Care Management/Social Workerr. Management plans discussed with the patient, family and they are in agreement.  CODE STATUS: Full code  TOTAL TIME TAKING CARE OF THIS PATIENT: 38 minutes.   POSSIBLE D/C IN 2-3 DAYS, DEPENDING ON CLINICAL CONDITION.   Gladstone Lighter M.D on 02/07/2016 at 10:05 AM  Between 7am to 6pm - Pager - [phone]  After 6pm go to [email or web address] - password [email or web address]  Office  [phone]  CC: Primary care physician; [name], MD

## 2016-02-08 DIAGNOSIS — E43 Unspecified severe protein-calorie malnutrition: Secondary | ICD-10-CM | POA: Insufficient documentation

## 2016-02-08 LAB — BASIC METABOLIC PANEL
ANION GAP: 8 (ref 5–15)
BUN: 41 mg/dL — ABNORMAL HIGH (ref 6–20)
CO2: 26 mmol/L (ref 22–32)
Calcium: 6.8 mg/dL — ABNORMAL LOW (ref 8.9–10.3)
Chloride: 102 mmol/L (ref 101–111)
Creatinine, Ser: 2.11 mg/dL — ABNORMAL HIGH (ref 0.44–1.00)
GFR calc Af Amer: 26 mL/min — ABNORMAL LOW (ref >60)
GFR calc non Af Amer: 22 mL/min — ABNORMAL LOW (ref >60)
GLUCOSE: 181 mg/dL — AB (ref 65–99)
POTASSIUM: 3.1 mmol/L — AB (ref 3.5–5.1)
Sodium: 136 mmol/L (ref 135–145)

## 2016-02-08 LAB — CBC
HEMATOCRIT: 24.5 % — AB (ref 35.0–47.0)
Hemoglobin: 8.7 g/dL — ABNORMAL LOW (ref 12.0–16.0)
MCH: 32.5 pg (ref 26.0–34.0)
MCHC: 35.4 g/dL (ref 32.0–36.0)
MCV: 91.8 fL (ref 80.0–100.0)
Platelets: 12 10*3/uL — CL (ref 150–440)
RBC: 2.66 MIL/uL — AB (ref 3.80–5.20)
RDW: 18.1 % — ABNORMAL HIGH (ref 11.5–14.5)
WBC: 1 10*3/uL — AB (ref 3.6–11.0)

## 2016-02-08 LAB — GLUCOSE, CAPILLARY
GLUCOSE-CAPILLARY: 250 mg/dL — AB (ref 65–99)
GLUCOSE-CAPILLARY: 176 mg/dL — AB (ref 65–99)
Glucose-Capillary: 167 mg/dL — ABNORMAL HIGH (ref 65–99)
Glucose-Capillary: 223 mg/dL — ABNORMAL HIGH (ref 65–99)

## 2016-02-08 MED ORDER — SODIUM CHLORIDE 0.9 % IV SOLN
Freq: Once | INTRAVENOUS | Status: AC
  Administered 2016-02-08: 16:00:00 via INTRAVENOUS

## 2016-02-08 MED ORDER — METOCLOPRAMIDE HCL 5 MG/ML IJ SOLN
10.0000 mg | Freq: Three times a day (TID) | INTRAMUSCULAR | Status: DC
  Administered 2016-02-08 – 2016-02-11 (×15): 10 mg via INTRAVENOUS
  Filled 2016-02-08 (×15): qty 2

## 2016-02-08 MED ORDER — ACETAMINOPHEN 500 MG PO TABS
1000.0000 mg | ORAL_TABLET | Freq: Once | ORAL | Status: AC
  Administered 2016-02-08: 1000 mg via ORAL
  Filled 2016-02-08: qty 2

## 2016-02-08 MED ORDER — POTASSIUM CHLORIDE CRYS ER 20 MEQ PO TBCR
40.0000 meq | EXTENDED_RELEASE_TABLET | ORAL | Status: AC
  Administered 2016-02-08 (×2): 40 meq via ORAL
  Filled 2016-02-08 (×2): qty 2

## 2016-02-08 NOTE — Progress Notes (Signed)
[facility] [place] NAME: [name] [name]    MR#:  [id number]  DATE OF BIRTH:  [birth year]  SUBJECTIVE:  CHIEF COMPLAINT:  No chief complaint on file.  - Feels miserable and is unable to keep anything down since last night. Complains of nausea and vomiting after eating -Platelets dropped down to 12 K. Received 1 unit packed RBC transfusion yesterday  REVIEW OF SYSTEMS:  Review of Systems  Constitutional: Positive for malaise/fatigue. Negative for chills and fever.  HENT: Negative for ear discharge, ear pain, nosebleeds and tinnitus.   Respiratory: Negative for cough, shortness of breath and wheezing.   Cardiovascular: Negative for chest pain and palpitations.  Gastrointestinal: Positive for nausea and vomiting. Negative for abdominal pain, constipation and diarrhea.  Genitourinary: Negative for dysuria and urgency.  Musculoskeletal: Negative for myalgias.  Neurological: Negative for dizziness, sensory change, speech change, focal weakness, seizures and headaches.  Psychiatric/Behavioral: Negative for depression.    DRUG ALLERGIES:   Allergies  Allergen Reactions  . Coconut Fatty Acids Swelling    Throat swells    VITALS:  Blood pressure (!) 126/51, pulse 76, temperature 99.1 F (37.3 C), temperature source Oral, resp. rate 18, height '5\' 4"'$  (1.626 m), weight 69.8 kg (153 lb 14.4 oz), SpO2 94 %.  PHYSICAL EXAMINATION:  Physical Exam  GENERAL:  75 y.o.-year-old patient lying in the bed with no acute distress.  EYES: Pupils equal, round, reactive to light and accommodation. No scleral icterus. Extraocular muscles intact. Conjunctival pallor HEENT: Head atraumatic, normocephalic. Oropharynx and nasopharynx clear.  NECK:  Supple, no jugular venous distention. No thyroid enlargement, no tenderness.  LUNGS: Normal breath sounds bilaterally, no wheezing, rales,rhonchi or crepitation. No use of accessory muscles of respiration. Diminished  breath sounds at the bases. Minimal exp wheeze on exam. CARDIOVASCULAR: S1, S2 normal. No  rubs, or gallops. 2/6 systolic murmur is present ABDOMEN: Soft, nontender, nondistended. Bowel sounds present. No organomegaly or mass.  EXTREMITIES: No pedal edema, cyanosis, or clubbing.  NEUROLOGIC: Cranial nerves II through XII are intact. Muscle strength 5/5 in all extremities. Sensation intact. Gait not checked.  PSYCHIATRIC: The patient is alert and oriented x 3.  SKIN: No obvious rash, lesion, or ulcer.    LABORATORY PANEL:   CBC  Recent Labs Lab 02/08/16 0524  WBC 1.0*  HGB 8.7*  HCT 24.5*  PLT 12*   ------------------------------------------------------------------------------------------------------------------  Chemistries   Recent Labs Lab 02/05/16 1102  02/08/16 0524  NA 132*  < > 136  K 4.5  < > 3.1*  CL 91*  < > 102  CO2 27  < > 26  GLUCOSE 282*  < > 181*  BUN 83*  < > 41*  CREATININE 3.27*  < > 2.11*  CALCIUM 8.6*  < > 6.8*  MG 1.9  --   --   AST 19  --   --   ALT 15  --   --   ALKPHOS 72  --   --   BILITOT 0.7  --   --   < > = values in this interval not displayed. ------------------------------------------------------------------------------------------------------------------  Cardiac Enzymes No results for input(s): TROPONINI in the last 168 hours. ------------------------------------------------------------------------------------------------------------------  RADIOLOGY:  No results found.  EKG:   Orders placed or performed in visit on 10/29/15  . EKG 12-Lead    ASSESSMENT AND PLAN:   75 year old female with past medical history of CVA, COPD, lung cancer, diabetes, coronary artery disease, macular degeneration who  presents to the hospital due to intractable nausea vomiting and noted to be in acute renal failure.   1. Acute kidney injury-secondary to persistent nausea vomiting and dehydration. Prerenal causes - baseline cr of 1, now improved  to 2 since admission - appreciate nephrology consult, continue IV fluids, avoid nephrotoxins  2. Intractable N/V - due to chemo. Advance diet as tolerated - Symptoms started again last night. Added Reglan prior to eating - supportive care w/ IV fluids, anti-emetics and will monitor.   3. Acute on chronic anemia-secondary to chemotherapy. -Received a total of 2 units of packed RBC transfusion since admission and her hemoglobin is around 8.7 today, her baseline is around 10 -Monitor closely. We'll keep hemoglobin greater than 8  4. 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 Finished radiation, on cycle 3 of chemotherapy. -Follow up with oncology as outpatient after discharge. - cont inhalers and prn nebs for her wheezing  5. Pancytopenia-with Leukopenia/thrombocytopenia-secondary to the chemotherapy. -Neupogen ordered for 4 days starting yesterday. Since platelets dropped down to 12 K, giving 2 units of platelet transfusion today. No active bleeding noted at this time. -Monitor closely  6. Hypertension-continue Imdur  7. DVT prophylaxis-Ted's and SCDs. Hold off on heparin products due to anemia and thrombocytopenia  8. Hypokalemia-secondary to nausea vomiting, being replaced   Physical therapy worked with the patient and recommended rehabilitation.    All the records are reviewed and case discussed with Care Management/Social Workerr. Management plans discussed with the patient, family and they are in agreement.  CODE STATUS: Full code  TOTAL TIME TAKING CARE OF THIS PATIENT: 37 minutes.   POSSIBLE D/C IN 2-3 DAYS, DEPENDING ON CLINICAL CONDITION.   Gladstone Lighter M.D on 02/08/2016 at 1:15 PM  Between 7am to 6pm - Pager - [phone]  After 6pm go to [email or web address] - password [email or web address]  Office  [phone]  CC: Primary care physician; [name], MD

## 2016-02-08 NOTE — Progress Notes (Signed)
[facility]  ROUNDING NOTE   Subjective:   Laying in bed comfortably. Did not tolerate PO last night. Daughter at bedside.   NS at 165m/hr  Objective:  Vital signs in last 24 hours:  Temp:  [97.7 F (36.5 C)-99.1 F (37.3 C)] 99.1 F (37.3 C) (11/03 2020) Pulse Rate:  [75-84] 76 (11/03 2020) Resp:  [16-18] 18 (11/03 2020) BP: (122-131)/(50-92) 126/51 (11/03 2020) SpO2:  [93 %-98 %] 94 % (11/03 2020)  Weight change:  Filed Weights   02/05/16 1511  Weight: 69.8 kg (153 lb 14.4 oz)    Intake/Output: I/O last 3 completed shifts: In: 4538.5 [P.O.:480; I.V.:3531.5; Blood:290; NG/GT:237] Out: 925 [Urine:925]   Intake/Output this shift:  Total I/O In: 120 [P.O.:120] Out: 500 [Urine:500]  Physical Exam: General: NAD,   Head: Normocephalic, atraumatic. Moist oral mucosal membranes  Eyes: Anicteric, PERRL  Neck: Supple, trachea midline  Lungs:  Clear to auscultation  Heart: Regular rate and rhythm  Abdomen:  Soft, nontender,   Extremities: no peripheral edema.  Neurologic: Nonfocal, moving all four extremities  Skin: No lesions       Basic Metabolic Panel:  Recent Labs Lab 02/05/16 1102 02/06/16 0550 02/07/16 0515 02/08/16 0524  NA 132* 136 138 136  K 4.5 4.1 3.6 3.1*  CL 91* 100* 102 102  CO2 '27 29 28 26  '$ GLUCOSE 282* 145* 151* 181*  BUN 83* 70* 55* 41*  CREATININE 3.27* 2.71* 2.34* 2.11*  CALCIUM 8.6* 7.8* 7.3* 6.8*  MG 1.9  --   --   --     Liver Function Tests:  Recent Labs Lab 02/05/16 1102  AST 19  ALT 15  ALKPHOS 72  BILITOT 0.7  PROT 6.3*  ALBUMIN 3.4*   No results for input(s): LIPASE, AMYLASE in the last 168 hours. No results for input(s): AMMONIA in the last 168 hours.  CBC:  Recent Labs Lab 02/05/16 1102 02/06/16 0550 02/07/16 0515 02/08/16 0524  WBC 3.1* 2.2* 1.0* 1.0*  NEUTROABS 2.7  --   --   --   HGB 7.7* 8.2* 7.3* 8.7*  HCT 22.2* 23.9* 20.9* 24.5*  MCV 95.5 92.1 92.0 91.8  PLT 75* 51* 28* 12*     Cardiac Enzymes: No results for input(s): CKTOTAL, CKMB, CKMBINDEX, TROPONINI in the last 168 hours.  BNP: Invalid input(s): POCBNP  CBG:  Recent Labs Lab 02/07/16 0734 02/07/16 1142 02/07/16 1646 02/07/16 2042 02/08/16 0737  GLUCAP 154* 208* 189* 189* 167*    Microbiology: Results for orders placed or performed during the hospital encounter of 01/21/16  Urine culture     Status: Abnormal   Collection Time: 01/21/16  1:45 PM  Result Value Ref Range Status   Specimen Description URINE, RANDOM  Final   Special Requests NONE  Final   Culture >=100,000 COLONIES/mL ESCHERICHIA COLI (A)  Final   Report Status 01/23/2016 FINAL  Final   Organism ID, Bacteria ESCHERICHIA COLI (A)  Final      Susceptibility   Escherichia coli - MIC*    AMPICILLIN 4 SENSITIVE Sensitive     CEFAZOLIN <=4 SENSITIVE Sensitive     CEFTRIAXONE <=1 SENSITIVE Sensitive     CIPROFLOXACIN >=4 RESISTANT Resistant     GENTAMICIN <=1 SENSITIVE Sensitive     IMIPENEM <=0.25 SENSITIVE Sensitive     NITROFURANTOIN <=16 SENSITIVE Sensitive     TRIMETH/SULFA <=20 SENSITIVE Sensitive     AMPICILLIN/SULBACTAM <=2 SENSITIVE Sensitive     PIP/TAZO <=4 SENSITIVE Sensitive  Extended ESBL NEGATIVE Sensitive     * >=100,000 COLONIES/mL ESCHERICHIA COLI    Coagulation Studies: No results for input(s): LABPROT, INR in the last 72 hours.  Urinalysis: No results for input(s): COLORURINE, LABSPEC, PHURINE, GLUCOSEU, HGBUR, BILIRUBINUR, KETONESUR, PROTEINUR, UROBILINOGEN, NITRITE, LEUKOCYTESUR in the last 72 hours.  Invalid input(s): APPERANCEUR    Imaging: No results found.   Medications:   . sodium chloride 100 mL/hr at 02/08/16 0621   . sodium chloride   Intravenous Once  . aspirin EC  81 mg Oral BH-q7a  . atorvastatin  20 mg Oral QHS  . feeding supplement (NEPRO CARB STEADY)  237 mL Oral BID BM  . gabapentin  300 mg Oral BID  . insulin aspart  0-5 Units Subcutaneous QHS  . insulin aspart  0-9  Units Subcutaneous TID WC  . isosorbide mononitrate  30 mg Oral Daily  . metoCLOPramide (REGLAN) injection  10 mg Intravenous TID AC & HS  . mometasone-formoterol  2 puff Inhalation BID  . ondansetron (ZOFRAN) IV  4 mg Intravenous Q6H  . pantoprazole  40 mg Oral Daily  . potassium chloride  40 mEq Oral Q4H  . Tbo-Filgrastim  480 mcg Subcutaneous Daily  . tiotropium  18 mcg Inhalation Daily   acetaminophen **OR** acetaminophen, diazepam, ipratropium-albuterol, nitroGLYCERIN, promethazine  Assessment/ Plan:  [name] is a 75 y.o. white female with Diabetes, COPD, hypertension, lung cancer treated with radiation and chemotherapy, macular degeneration, history of stroke, was admitted on 02/05/2016 with dehydration, nausea, vomiting postchemotherapy.   1. Acute renal failure on chronic kidney disease stage III: baseline creatinine is 1.02 from 01/21/2016/GFR 53 Acute kidney injury is likely secondary to volume depletion and possibly ATN post-chemotherapy from nausea and vomiting Agree with IV hydration. Monitor potassium and calcium due to normal saline.  Continue to monitor electrolytes and renal function on a daily basis - avoid NSAIDs including naproxen.   2. Hypertension: blood pressure at goal.  - holding losartan and hydrochlorothiazide due to acute renal failure.   3. Diabetes mellitus type II with chronic kidney disease: holding metformin due to renal failure - continue glucose control.    LOS: Curtis, Oseias Horsey 11/4/201711:21 AM

## 2016-02-08 NOTE — Progress Notes (Signed)
PT Cancellation Note  Patient Details Name: [name] [name] MRN: [id number] DOB: 06/12/1940   Cancelled Treatment:    Reason Eval/Treat Not Completed: Medical issues which prohibited therapy    Session held today due to low platelet count of 12.  Will continue as appropriate.   Chesley Noon 02/08/2016, 10:43 AM

## 2016-02-09 LAB — TYPE AND SCREEN
ABO/RH(D): O POS
ANTIBODY SCREEN: NEGATIVE
Unit division: 0
Unit division: 0

## 2016-02-09 LAB — BASIC METABOLIC PANEL
ANION GAP: 7 (ref 5–15)
BUN: 28 mg/dL — ABNORMAL HIGH (ref 6–20)
CALCIUM: 6.6 mg/dL — AB (ref 8.9–10.3)
CO2: 28 mmol/L (ref 22–32)
Chloride: 102 mmol/L (ref 101–111)
Creatinine, Ser: 1.6 mg/dL — ABNORMAL HIGH (ref 0.44–1.00)
GFR calc Af Amer: 36 mL/min — ABNORMAL LOW (ref >60)
GFR, EST NON AFRICAN AMERICAN: 31 mL/min — AB (ref >60)
GLUCOSE: 166 mg/dL — AB (ref 65–99)
Potassium: 3.4 mmol/L — ABNORMAL LOW (ref 3.5–5.1)
Sodium: 137 mmol/L (ref 135–145)

## 2016-02-09 LAB — GLUCOSE, CAPILLARY
GLUCOSE-CAPILLARY: 288 mg/dL — AB (ref 65–99)
GLUCOSE-CAPILLARY: 221 mg/dL — AB (ref 65–99)
GLUCOSE-CAPILLARY: 191 mg/dL — AB (ref 65–99)
Glucose-Capillary: 203 mg/dL — ABNORMAL HIGH (ref 65–99)

## 2016-02-09 LAB — CBC
HCT: 24.1 % — ABNORMAL LOW (ref 35.0–47.0)
Hemoglobin: 8.5 g/dL — ABNORMAL LOW (ref 12.0–16.0)
MCH: 32.6 pg (ref 26.0–34.0)
MCHC: 35.4 g/dL (ref 32.0–36.0)
MCV: 92.1 fL (ref 80.0–100.0)
PLATELETS: 80 10*3/uL — AB (ref 150–440)
RBC: 2.62 MIL/uL — ABNORMAL LOW (ref 3.80–5.20)
RDW: 17.7 % — AB (ref 11.5–14.5)
WBC: 0.6 10*3/uL — AB (ref 3.6–11.0)

## 2016-02-09 LAB — PREPARE PLATELET PHERESIS
UNIT DIVISION: 0
Unit division: 0

## 2016-02-09 MED ORDER — ONDANSETRON HCL 4 MG/2ML IJ SOLN
2.0000 mg | Freq: Four times a day (QID) | INTRAMUSCULAR | Status: DC | PRN
  Administered 2016-02-09: 2 mg via INTRAVENOUS
  Filled 2016-02-09: qty 2

## 2016-02-09 MED ORDER — POTASSIUM CHLORIDE CRYS ER 20 MEQ PO TBCR
40.0000 meq | EXTENDED_RELEASE_TABLET | Freq: Once | ORAL | Status: AC
  Administered 2016-02-09: 12:00:00 40 meq via ORAL
  Filled 2016-02-09: qty 2

## 2016-02-09 NOTE — Progress Notes (Signed)
[facility]  ROUNDING NOTE   Subjective:   Tolerated her PO breakfast this morning.   Creatinine 1.6 (2.11)  Objective:  Vital signs in last 24 hours:  Temp:  [97.8 F (36.6 C)-101.4 F (38.6 C)] 98.8 F (37.1 C) (11/05 0449) Pulse Rate:  [72-89] 76 (11/05 0449) Resp:  [16-24] 16 (11/05 0449) BP: (113-146)/(37-65) 137/56 (11/05 0449) SpO2:  [92 %-100 %] 100 % (11/05 0449)  Weight change:  Filed Weights   02/05/16 1511  Weight: 69.8 kg (153 lb 14.4 oz)    Intake/Output: I/O last 3 completed shifts: In: 2471.3 [P.O.:480; I.V.:1460; Blood:531.3] Out: 3875 [Urine:3875]   Intake/Output this shift:  Total I/O In: 1200 [I.V.:1200] Out: 600 [Urine:600]  Physical Exam: General: NAD,   Head: Normocephalic, atraumatic. Moist oral mucosal membranes  Eyes: Anicteric, PERRL  Neck: Supple, trachea midline  Lungs:  Clear to auscultation  Heart: Regular rate and rhythm  Abdomen:  Soft, nontender,   Extremities: no peripheral edema.  Neurologic: Nonfocal, moving all four extremities  Skin: No lesions       Basic Metabolic Panel:  Recent Labs Lab 02/05/16 1102 02/06/16 0550 02/07/16 0515 02/08/16 0524 02/09/16 0527  NA 132* 136 138 136 137  K 4.5 4.1 3.6 3.1* 3.4*  CL 91* 100* 102 102 102  CO2 '27 29 28 26 28  '$ GLUCOSE 282* 145* 151* 181* 166*  BUN 83* 70* 55* 41* 28*  CREATININE 3.27* 2.71* 2.34* 2.11* 1.60*  CALCIUM 8.6* 7.8* 7.3* 6.8* 6.6*  MG 1.9  --   --   --   --     Liver Function Tests:  Recent Labs Lab 02/05/16 1102  AST 19  ALT 15  ALKPHOS 72  BILITOT 0.7  PROT 6.3*  ALBUMIN 3.4*   No results for input(s): LIPASE, AMYLASE in the last 168 hours. No results for input(s): AMMONIA in the last 168 hours.  CBC:  Recent Labs Lab 02/05/16 1102 02/06/16 0550 02/07/16 0515 02/08/16 0524 02/09/16 0527  WBC 3.1* 2.2* 1.0* 1.0* 0.6*  NEUTROABS 2.7  --   --   --   --   HGB 7.7* 8.2* 7.3* 8.7* 8.5*  HCT 22.2* 23.9* 20.9* 24.5* 24.1*   MCV 95.5 92.1 92.0 91.8 92.1  PLT 75* 51* 28* 12* 80*    Cardiac Enzymes: No results for input(s): CKTOTAL, CKMB, CKMBINDEX, TROPONINI in the last 168 hours.  BNP: Invalid input(s): POCBNP  CBG:  Recent Labs Lab 02/08/16 0737 02/08/16 1132 02/08/16 1649 02/08/16 2149 02/09/16 0723  GLUCAP 167* 250* 176* 223* 203*    Microbiology: Results for orders placed or performed during the hospital encounter of 01/21/16  Urine culture     Status: Abnormal   Collection Time: 01/21/16  1:45 PM  Result Value Ref Range Status   Specimen Description URINE, RANDOM  Final   Special Requests NONE  Final   Culture >=100,000 COLONIES/mL ESCHERICHIA COLI (A)  Final   Report Status 01/23/2016 FINAL  Final   Organism ID, Bacteria ESCHERICHIA COLI (A)  Final      Susceptibility   Escherichia coli - MIC*    AMPICILLIN 4 SENSITIVE Sensitive     CEFAZOLIN <=4 SENSITIVE Sensitive     CEFTRIAXONE <=1 SENSITIVE Sensitive     CIPROFLOXACIN >=4 RESISTANT Resistant     GENTAMICIN <=1 SENSITIVE Sensitive     IMIPENEM <=0.25 SENSITIVE Sensitive     NITROFURANTOIN <=16 SENSITIVE Sensitive     TRIMETH/SULFA <=20 SENSITIVE Sensitive  AMPICILLIN/SULBACTAM <=2 SENSITIVE Sensitive     PIP/TAZO <=4 SENSITIVE Sensitive     Extended ESBL NEGATIVE Sensitive     * >=100,000 COLONIES/mL ESCHERICHIA COLI    Coagulation Studies: No results for input(s): LABPROT, INR in the last 72 hours.  Urinalysis: No results for input(s): COLORURINE, LABSPEC, PHURINE, GLUCOSEU, HGBUR, BILIRUBINUR, KETONESUR, PROTEINUR, UROBILINOGEN, NITRITE, LEUKOCYTESUR in the last 72 hours.  Invalid input(s): APPERANCEUR    Imaging: No results found.   Medications:   . sodium chloride 100 mL/hr at 02/09/16 0111   . aspirin EC  81 mg Oral BH-q7a  . atorvastatin  20 mg Oral QHS  . feeding supplement (NEPRO CARB STEADY)  237 mL Oral BID BM  . gabapentin  300 mg Oral BID  . insulin aspart  0-5 Units Subcutaneous QHS  .  insulin aspart  0-9 Units Subcutaneous TID WC  . isosorbide mononitrate  30 mg Oral Daily  . metoCLOPramide (REGLAN) injection  10 mg Intravenous TID AC & HS  . mometasone-formoterol  2 puff Inhalation BID  . pantoprazole  40 mg Oral Daily  . Tbo-Filgrastim  480 mcg Subcutaneous Daily  . tiotropium  18 mcg Inhalation Daily   acetaminophen **OR** acetaminophen, diazepam, ipratropium-albuterol, nitroGLYCERIN, promethazine  Assessment/ Plan:  [name] [name] is a 75 y.o. white female with Diabetes, COPD, hypertension, lung cancer treated with radiation and chemotherapy, macular degeneration, history of stroke, was admitted on 02/05/2016 with dehydration, nausea, vomiting postchemotherapy.   1. Acute renal failure on chronic kidney disease stage III: baseline creatinine is 1.02 from 01/21/2016/GFR 53 Acute kidney injury is likely secondary to volume depletion and possibly ATN post-chemotherapy from nausea and vomiting  Monitor potassium and calcium due to normal saline.  Continue to monitor electrolytes and renal function on a daily basis - avoid NSAIDs including naproxen.  - Discontinue IV fluids, encourage PO intake.   2. Hypertension: blood pressure at goal.  - holding losartan and hydrochlorothiazide due to acute renal failure.   3. Diabetes mellitus type II with chronic kidney disease: holding metformin due to renal failure - continue glucose control.   4. Lung cancer: status post chemotherapy.  - Followed by Dr. [name].    LOS: [place], [place] 11/5/201710:31 AM

## 2016-02-09 NOTE — Care Management Important Message (Signed)
Important Message  Patient Details  Name: [name] [name] MRN: [id number] Date of Birth: 1941-02-05   Medicare Important Message Given:  Yes    Delbert Darley A, RN 02/09/2016, 10:55 AM

## 2016-02-09 NOTE — Clinical Social Work Note (Signed)
CSW received consult for possible SNF. CSW will follow pending PT/OT recommendations.  Santiago Bumpers, MSW, LCSW-A [phone]

## 2016-02-09 NOTE — Progress Notes (Signed)
[facility] [place] NAME: [name] [name]    MR#:  [id number]  DATE OF BIRTH:  December 21, 1940  SUBJECTIVE:  CHIEF COMPLAINT:  No chief complaint on file.  - Feels better, nausea/vomiting have improved. - plts and hb stable after transfusions, wbc continues to drop- on neupogen  REVIEW OF SYSTEMS:  Review of Systems  Constitutional: Positive for malaise/fatigue. Negative for chills and fever.  HENT: Negative for ear discharge, ear pain, nosebleeds and tinnitus.   Respiratory: Negative for cough, shortness of breath and wheezing.   Cardiovascular: Negative for chest pain and palpitations.  Gastrointestinal: Positive for nausea. Negative for abdominal pain, constipation, diarrhea and vomiting.  Genitourinary: Negative for dysuria and urgency.  Musculoskeletal: Negative for myalgias.  Neurological: Negative for dizziness, sensory change, speech change, focal weakness, seizures and headaches.  Psychiatric/Behavioral: Negative for depression.    DRUG ALLERGIES:   Allergies  Allergen Reactions  . Coconut Fatty Acids Swelling    Throat swells    VITALS:  Blood pressure (!) 137/56, pulse 76, temperature 98.8 F (37.1 C), temperature source Oral, resp. rate 16, height '5\' 4"'$  (1.626 m), weight 69.8 kg (153 lb 14.4 oz), SpO2 100 %.  PHYSICAL EXAMINATION:  Physical Exam  GENERAL:  75 y.o.-year-old patient lying in the bed with no acute distress.  EYES: Pupils equal, round, reactive to light and accommodation. No scleral icterus. Extraocular muscles intact. Conjunctival pallor HEENT: Head atraumatic, normocephalic. Oropharynx and nasopharynx clear.  NECK:  Supple, no jugular venous distention. No thyroid enlargement, no tenderness.  LUNGS: Normal breath sounds bilaterally, no wheezing, rales,rhonchi or crepitation. No use of accessory muscles of respiration. Diminished breath sounds at the bases. Rhonchi on right side. CARDIOVASCULAR: S1, S2  normal. No  rubs, or gallops. 2/6 systolic murmur is present ABDOMEN: Soft, nontender, nondistended. Bowel sounds present. No organomegaly or mass.  EXTREMITIES: No pedal edema, cyanosis, or clubbing.  NEUROLOGIC: Cranial nerves II through XII are intact. Muscle strength 5/5 in all extremities. Sensation intact. Gait not checked.  PSYCHIATRIC: The patient is alert and oriented x 3.  SKIN: No obvious rash, lesion, or ulcer.    LABORATORY PANEL:   CBC  Recent Labs Lab 02/09/16 0527  WBC 0.6*  HGB 8.5*  HCT 24.1*  PLT 80*   ------------------------------------------------------------------------------------------------------------------  Chemistries   Recent Labs Lab 02/05/16 1102  02/09/16 0527  NA 132*  < > 137  K 4.5  < > 3.4*  CL 91*  < > 102  CO2 27  < > 28  GLUCOSE 282*  < > 166*  BUN 83*  < > 28*  CREATININE 3.27*  < > 1.60*  CALCIUM 8.6*  < > 6.6*  MG 1.9  --   --   AST 19  --   --   ALT 15  --   --   ALKPHOS 72  --   --   BILITOT 0.7  --   --   < > = values in this interval not displayed. ------------------------------------------------------------------------------------------------------------------  Cardiac Enzymes No results for input(s): TROPONINI in the last 168 hours. ------------------------------------------------------------------------------------------------------------------  RADIOLOGY:  No results found.  EKG:   Orders placed or performed in visit on 10/29/15  . EKG 12-Lead    ASSESSMENT AND PLAN:   75 year old female with past medical history of CVA, COPD, lung cancer, diabetes, coronary artery disease, macular degeneration who presents to the hospital due to intractable nausea vomiting and noted to be in acute renal  failure.   1. Acute kidney injury-secondary to persistent nausea vomiting and dehydration. Prerenal causes - baseline cr of 1, now improved to 1.6 since admission - appreciate nephrology consult, stop IV fluids today,  avoid nephrotoxins  2. Intractable N/V - due to chemo. Advance diet as tolerated -  Added Reglan prior to eating - supportive care w/ IV fluids, anti-emetics and will monitor.   3. Acute on chronic anemia-secondary to chemotherapy. -Received a total of 2 units of packed RBC transfusion since admission and her hemoglobin is around 8.5 today, her baseline is around 10 -Monitor closely. We'll keep hemoglobin greater than 8  4. 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 Finished radiation, on cycle 3 of chemotherapy. -Follow up with oncology as outpatient after discharge. - cont inhalers and prn nebs for her wheezing  5. Pancytopenia-with Leukopenia/thrombocytopenia-secondary to the chemotherapy. -Neupogen ordered for 4 days. Received plt Tx and stable now -Monitor closely. If fevers with her neutropenia- will start ABX and order cultures  6. Hypertension-continue Imdur  7. DVT prophylaxis-Ted's and SCDs. Hold off on heparin products due to anemia and thrombocytopenia  8. Hypokalemia-secondary to nausea vomiting, being replaced   Physical therapy worked with the patient and recommended rehabilitation.    All the records are reviewed and case discussed with Care Management/Social Workerr. Management plans discussed with the patient, family and they are in agreement.  CODE STATUS: Full code  TOTAL TIME TAKING CARE OF THIS PATIENT: 37 minutes.   POSSIBLE D/C IN 1-2 DAYS, DEPENDING ON CLINICAL CONDITION.   Gladstone Lighter M.D on 02/09/2016 at 11:31 AM  Between 7am to 6pm - Pager - [phone]  After 6pm go to [email or web address] - password [email or web address]  Office  [phone]  CC: Primary care physician; [name], MD

## 2016-02-09 NOTE — Plan of Care (Signed)
Problem: Pain Managment: Goal: General experience of comfort will improve Outcome: Progressing Pt has not complained of nausea this shift.   Problem: Physical Regulation: Goal: Ability to maintain clinical measurements within normal limits will improve Outcome: Not Progressing WBC count went from 1.0 to 0.6  Problem: Nutrition: Goal: Adequate nutrition will be maintained Outcome: Progressing Pt has kept all food down today.  Problem: Bowel/Gastric: Goal: Will not experience complications related to bowel motility Outcome: Not Progressing Pt has not had bowel movement since 02/06/16. Pt reports constipation

## 2016-02-10 ENCOUNTER — Inpatient Hospital Stay: Payer: Medicare Other

## 2016-02-10 DIAGNOSIS — T451X5S Adverse effect of antineoplastic and immunosuppressive drugs, sequela: Secondary | ICD-10-CM

## 2016-02-10 DIAGNOSIS — I2510 Atherosclerotic heart disease of native coronary artery without angina pectoris: Secondary | ICD-10-CM

## 2016-02-10 DIAGNOSIS — D6959 Other secondary thrombocytopenia: Secondary | ICD-10-CM

## 2016-02-10 DIAGNOSIS — F17210 Nicotine dependence, cigarettes, uncomplicated: Secondary | ICD-10-CM

## 2016-02-10 DIAGNOSIS — D709 Neutropenia, unspecified: Secondary | ICD-10-CM

## 2016-02-10 DIAGNOSIS — R197 Diarrhea, unspecified: Secondary | ICD-10-CM

## 2016-02-10 LAB — URINALYSIS COMPLETE WITH MICROSCOPIC (ARMC ONLY)
Bacteria, UA: NONE SEEN
Bilirubin Urine: NEGATIVE
Glucose, UA: >500 mg/dL — AB
KETONES UR: NEGATIVE mg/dL
LEUKOCYTES UA: NEGATIVE
Nitrite: NEGATIVE
PH: 7 (ref 5.0–8.0)
PROTEIN: 100 mg/dL — AB
SPECIFIC GRAVITY, URINE: 1.01 (ref 1.005–1.030)

## 2016-02-10 LAB — BASIC METABOLIC PANEL
ANION GAP: 11 (ref 5–15)
BUN: 25 mg/dL — AB (ref 6–20)
CO2: 28 mmol/L (ref 22–32)
Calcium: 6.3 mg/dL — CL (ref 8.9–10.3)
Chloride: 98 mmol/L — ABNORMAL LOW (ref 101–111)
Creatinine, Ser: 1.64 mg/dL — ABNORMAL HIGH (ref 0.44–1.00)
GFR calc Af Amer: 35 mL/min — ABNORMAL LOW (ref >60)
GFR calc non Af Amer: 30 mL/min — ABNORMAL LOW (ref >60)
GLUCOSE: 177 mg/dL — AB (ref 65–99)
POTASSIUM: 3 mmol/L — AB (ref 3.5–5.1)
Sodium: 137 mmol/L (ref 135–145)

## 2016-02-10 LAB — CBC
HEMATOCRIT: 22.8 % — AB (ref 35.0–47.0)
Hemoglobin: 8 g/dL — ABNORMAL LOW (ref 12.0–16.0)
MCH: 31.9 pg (ref 26.0–34.0)
MCHC: 35.1 g/dL (ref 32.0–36.0)
MCV: 90.9 fL (ref 80.0–100.0)
Platelets: 42 10*3/uL — ABNORMAL LOW (ref 150–440)
RBC: 2.51 MIL/uL — AB (ref 3.80–5.20)
RDW: 17.5 % — AB (ref 11.5–14.5)
WBC: 0.3 10*3/uL — AB (ref 3.6–11.0)

## 2016-02-10 LAB — GLUCOSE, CAPILLARY
GLUCOSE-CAPILLARY: 189 mg/dL — AB (ref 65–99)
Glucose-Capillary: 158 mg/dL — ABNORMAL HIGH (ref 65–99)
Glucose-Capillary: 226 mg/dL — ABNORMAL HIGH (ref 65–99)
Glucose-Capillary: 268 mg/dL — ABNORMAL HIGH (ref 65–99)

## 2016-02-10 LAB — MAGNESIUM: Magnesium: 0.7 mg/dL — CL (ref 1.7–2.4)

## 2016-02-10 MED ORDER — POTASSIUM CHLORIDE CRYS ER 20 MEQ PO TBCR: 40.0000 meq | EXTENDED_RELEASE_TABLET | ORAL | Status: DC

## 2016-02-10 MED ORDER — MAGNESIUM OXIDE 400 (241.3 MG) MG PO TABS
400.0000 mg | ORAL_TABLET | Freq: Two times a day (BID) | ORAL | Status: AC
  Administered 2016-02-10 (×2): 400 mg via ORAL
  Filled 2016-02-10 (×2): qty 1

## 2016-02-10 MED ORDER — ENSURE ENLIVE PO LIQD
237.0000 mL | Freq: Two times a day (BID) | ORAL | Status: DC
  Administered 2016-02-10 – 2016-02-13 (×5): 237 mL via ORAL

## 2016-02-10 MED ORDER — SODIUM CHLORIDE 0.9 % IV SOLN
30.0000 meq | Freq: Once | INTRAVENOUS | Status: AC
  Administered 2016-02-10: 11:00:00 30 meq via INTRAVENOUS
  Filled 2016-02-10: qty 15

## 2016-02-10 MED ORDER — POTASSIUM CHLORIDE CRYS ER 20 MEQ PO TBCR
40.0000 meq | EXTENDED_RELEASE_TABLET | Freq: Once | ORAL | Status: AC
  Administered 2016-02-10: 40 meq via ORAL
  Filled 2016-02-10: qty 2

## 2016-02-10 MED ORDER — CALCIUM CARBONATE ANTACID 500 MG PO CHEW
1.0000 | CHEWABLE_TABLET | Freq: Three times a day (TID) | ORAL | Status: DC
  Administered 2016-02-10 – 2016-02-13 (×11): 200 mg via ORAL
  Filled 2016-02-10 (×11): qty 1

## 2016-02-10 MED ORDER — MAGNESIUM SULFATE 4 GM/100ML IV SOLN
4.0000 g | Freq: Once | INTRAVENOUS | Status: AC
  Administered 2016-02-10: 4 g via INTRAVENOUS
  Filled 2016-02-10: qty 100

## 2016-02-10 NOTE — Plan of Care (Signed)
Problem: Fluid Volume: Goal: Ability to maintain a balanced intake and output will improve Outcome: Not Progressing Pt continues to have episodes of vomiting.  Problem: Nutrition: Goal: Adequate nutrition will be maintained Outcome: Not Progressing Pt continues to be unable to tolerated po medications

## 2016-02-10 NOTE — Progress Notes (Signed)
LCSW has reviewed chart and following for potential discharge needs.  LCSW has reviewed PT evaluation for SNF, with hopes of improvement with diet and medical status.   LCSW will follow as needed and assist with placement if recommendations remains SNF.  Lane Hacker, MSW Clinical Social Work: [employer] Coverage for :  [phone]

## 2016-02-10 NOTE — Progress Notes (Signed)
[facility]  ROUNDING NOTE   Subjective:  Patient seen at bedside. Renal function appears to be stabilizing as creatinine is 1.64. Serum calcium a bit low at 6.3. WBC count low at 0.3. Also having nausea and vomiting periodically. Good urine output of 3 L over the preceding 24 hours.   Objective:  Vital signs in last 24 hours:  Temp:  [99 F (37.2 C)-100.2 F (37.9 C)] 100.2 F (37.9 C) (11/06 0358) Pulse Rate:  [84-89] 87 (11/06 0358) Resp:  [18-24] 24 (11/06 0358) BP: (125-143)/(45-57) 143/57 (11/06 0358) SpO2:  [93 %-95 %] 93 % (11/06 0358)  Weight change:  Filed Weights   02/05/16 1511  Weight: 69.8 kg (153 lb 14.4 oz)    Intake/Output: I/O last 3 completed shifts: In: 6384 [P.O.:360; I.V.:1200; Blood:252] Out: 5364 [Urine:4900]   Intake/Output this shift:  Total I/O In: 240 [P.O.:240] Out: 350 [Urine:350]  Physical Exam: General: NAD  Head: Normocephalic, atraumatic. Moist oral mucosal membranes  Eyes: Anicteric  Neck: Supple, trachea midline  Lungs:  Clear to auscultation, Normal effort   Heart: Regular rate and rhythm  Abdomen:  Soft, nontender, Bowel sounds present   Extremities: no peripheral edema.  Neurologic: Nonfocal, moving all four extremities  Skin: No lesions       Basic Metabolic Panel:  Recent Labs Lab 02/05/16 1102 02/06/16 0550 02/07/16 0515 02/08/16 0524 02/09/16 0527 02/10/16 0514  NA 132* 136 138 136 137 137  K 4.5 4.1 3.6 3.1* 3.4* 3.0*  CL 91* 100* 102 102 102 98*  CO2 '27 29 28 26 28 28  '$ GLUCOSE 282* 145* 151* 181* 166* 177*  BUN 83* 70* 55* 41* 28* 25*  CREATININE 3.27* 2.71* 2.34* 2.11* 1.60* 1.64*  CALCIUM 8.6* 7.8* 7.3* 6.8* 6.6* 6.3*  MG 1.9  --   --   --   --  0.7*    Liver Function Tests:  Recent Labs Lab 02/05/16 1102  AST 19  ALT 15  ALKPHOS 72  BILITOT 0.7  PROT 6.3*  ALBUMIN 3.4*   No results for input(s): LIPASE, AMYLASE in the last 168 hours. No results for input(s): AMMONIA in  the last 168 hours.  CBC:  Recent Labs Lab 02/05/16 1102 02/06/16 0550 02/07/16 0515 02/08/16 0524 02/09/16 0527 02/10/16 0514  WBC 3.1* 2.2* 1.0* 1.0* 0.6* 0.3*  NEUTROABS 2.7  --   --   --   --   --   HGB 7.7* 8.2* 7.3* 8.7* 8.5* 8.0*  HCT 22.2* 23.9* 20.9* 24.5* 24.1* 22.8*  MCV 95.5 92.1 92.0 91.8 92.1 90.9  PLT 75* 51* 28* 12* 80* 42*    Cardiac Enzymes: No results for input(s): CKTOTAL, CKMB, CKMBINDEX, TROPONINI in the last 168 hours.  BNP: Invalid input(s): POCBNP  CBG:  Recent Labs Lab 02/09/16 0723 02/09/16 1128 02/09/16 1718 02/09/16 2024 02/10/16 0738  GLUCAP 203* 288* 221* 191* 158*    Microbiology: Results for orders placed or performed during the hospital encounter of 01/21/16  Urine culture     Status: Abnormal   Collection Time: 01/21/16  1:45 PM  Result Value Ref Range Status   Specimen Description URINE, RANDOM  Final   Special Requests NONE  Final   Culture >=100,000 COLONIES/mL ESCHERICHIA COLI (A)  Final   Report Status 01/23/2016 FINAL  Final   Organism ID, Bacteria ESCHERICHIA COLI (A)  Final      Susceptibility   Escherichia coli - MIC*    AMPICILLIN 4 SENSITIVE Sensitive  CEFAZOLIN <=4 SENSITIVE Sensitive     CEFTRIAXONE <=1 SENSITIVE Sensitive     CIPROFLOXACIN >=4 RESISTANT Resistant     GENTAMICIN <=1 SENSITIVE Sensitive     IMIPENEM <=0.25 SENSITIVE Sensitive     NITROFURANTOIN <=16 SENSITIVE Sensitive     TRIMETH/SULFA <=20 SENSITIVE Sensitive     AMPICILLIN/SULBACTAM <=2 SENSITIVE Sensitive     PIP/TAZO <=4 SENSITIVE Sensitive     Extended ESBL NEGATIVE Sensitive     * >=100,000 COLONIES/mL ESCHERICHIA COLI    Coagulation Studies: No results for input(s): LABPROT, INR in the last 72 hours.  Urinalysis: No results for input(s): COLORURINE, LABSPEC, PHURINE, GLUCOSEU, HGBUR, BILIRUBINUR, KETONESUR, PROTEINUR, UROBILINOGEN, NITRITE, LEUKOCYTESUR in the last 72 hours.  Invalid input(s): APPERANCEUR    Imaging: No  results found.   Medications:    . aspirin EC  81 mg Oral BH-q7a  . atorvastatin  20 mg Oral QHS  . calcium carbonate  1 tablet Oral TID WC  . feeding supplement (NEPRO CARB STEADY)  237 mL Oral BID BM  . gabapentin  300 mg Oral BID  . insulin aspart  0-5 Units Subcutaneous QHS  . insulin aspart  0-9 Units Subcutaneous TID WC  . isosorbide mononitrate  30 mg Oral Daily  . magnesium oxide  400 mg Oral BID  . metoCLOPramide (REGLAN) injection  10 mg Intravenous TID AC & HS  . mometasone-formoterol  2 puff Inhalation BID  . pantoprazole  40 mg Oral Daily  . potassium chloride (KCL MULTIRUN) 30 mEq in 265 mL IVPB  30 mEq Intravenous Once  . tiotropium  18 mcg Inhalation Daily   acetaminophen **OR** acetaminophen, diazepam, ipratropium-albuterol, nitroGLYCERIN, ondansetron (ZOFRAN) IV, promethazine  Assessment/ Plan:  [name] is a 75 y.o. white female with Diabetes, COPD, hypertension, lung cancer treated with radiation and chemotherapy, macular degeneration, history of stroke, was admitted on 02/05/2016 with dehydration, nausea, vomiting postchemotherapy.   1. Acute renal failure on chronic kidney disease stage III: baseline creatinine is 1.02 from 01/21/2016/GFR 53 Acute kidney injury is likely secondary to volume depletion and possibly ATN post-chemotherapy from nausea and vomiting -  Patient continues to have some periods of nausea and vomiting.  Continue symptomatically control of nausea and vomiting. Patient off IV fluid hydration for now. Continue to monitor renal function trend.  2. Hypertension: blood pressure at goal.  - holding losartan and hydrochlorothiazide due to acute renal failure.   3. Diabetes mellitus type II with chronic kidney disease: Metformin remains on hold. Continue glucose monitoring and management of blood sugars per hospitalist.  4. Lung cancer: status post chemotherapy.  - Followed by Dr. [name].   5.  Hypokalemia. Serum potassium noted to  be low at 3.0. Agree with potassium chloride 30 mEq IV supplementation today. Continue to monitor serum potassium. Recommend also checking serum magnesium periodically.   LOS: 5 Miroslava Santellan 11/6/201711:38 AM

## 2016-02-10 NOTE — Progress Notes (Signed)
[facility]  [phone] [phone]  ID: [name] [name] OB: Jul 02, 1940  MR#: [id number]  JKK#:[id number]  Patient Care Team: Jerrol Banana., MD as PCP - General (Unknown Physician Specialty) [name], MD as Consulting Physician (Cardiology) Algernon Huxley, MD as Consulting Physician (Vascular Surgery)  CHIEF COMPLAINT: Stage III small cell lung carcinoma of the right upper lobe, intractable nausea and vomiting, diarrhea, acute renal failure.  INTERVAL HISTORY: Patient still has significant weakness and fatigue as well as nausea, but states this is improved and she is able to tolerate minimal PO intake. She denies any fevers. She has no easy bleeding or bruising. Patient continues to feel terrible, but improved since admission.  REVIEW OF SYSTEMS:   Review of Systems  Constitutional: Positive for malaise/fatigue. Negative for fever and weight loss.  Respiratory: Negative for cough.   Cardiovascular: Negative.  Negative for chest pain and leg swelling.  Gastrointestinal: Positive for nausea and vomiting.  Genitourinary: Negative.   Musculoskeletal: Negative.   Neurological: Positive for weakness.  Psychiatric/Behavioral: Negative.  The patient is not nervous/anxious.     As per HPI. Otherwise, a complete review of systems is negative.  PAST MEDICAL HISTORY: Past Medical History:  Diagnosis Date  . Abnormal CT lung screening 10/17/2015  . COPD (chronic obstructive pulmonary disease) ([place])   . Coronary artery disease, non-occlusive    a. cath 2006: min nonobs CAD; b. cath 12/2010: cath LAD 50%, RCA 60%; c. 08/2013: Minimal luminal irregs, right dominant system with no significant CAD, diffuse luminal irregs noted. Normal EF 55%, no AS or MS.   . Diabetes mellitus   . Hyperlipemia    Followed by Dr. Rosanna Randy  . Hypertension   . Lung cancer ([place])   . Macular degeneration    rt  . Personal history of tobacco use, presenting hazards to  health 10/15/2015  . Pneumonia    hx  . Shortness of breath   . Stroke [facility])     PAST SURGICAL HISTORY: Past Surgical History:  Procedure Laterality Date  . ABDOMINAL HYSTERECTOMY    . ANTERIOR CERVICAL DECOMP/DISCECTOMY FUSION N/A 10/19/2013   Procedure: CERVICAL FIVE-SIX ANTERIOR CERVICAL DECOMPRESSION WITH FUSION INTERBODY PROSTHESIS PLATING AND PEEK CAGE;  Surgeon: Ophelia Charter, MD;  Location: [facility];  Service: Neurosurgery;  Laterality: N/A;  . BACK SURGERY  80's  . BREAST CYST EXCISION Left    left negative   . CARDIAC CATHETERIZATION  05/2004  . CATARACT EXTRACTION Left   . CHOLECYSTECTOMY    . ENDARTERECTOMY Left 10/24/2014   Procedure: ENDARTERECTOMY CAROTID;  Surgeon: Algernon Huxley, MD;  Location: [facility] ORS;  Service: Vascular;  Laterality: Left;  . ENDOBRONCHIAL ULTRASOUND N/A 11/14/2015   Procedure: ENDOBRONCHIAL ULTRASOUND;  Surgeon: [name], MD;  Location: [facility] ORS;  Service: Cardiopulmonary;  Laterality: N/A;  . PERIPHERAL VASCULAR CATHETERIZATION N/A 12/04/2015   Procedure: Glori Luis Cath Insertion;  Surgeon: Algernon Huxley, MD;  Location: [place] CV LAB;  Service: Cardiovascular;  Laterality: N/A;  . TONSILLECTOMY AND ADENOIDECTOMY    . VESICOVAGINAL FISTULA CLOSURE W/ TAH      FAMILY HISTORY: Family History  Problem Relation Age of Onset  . Stroke Mother     Massive  . Heart attack Father     Massive  . Heart attack Brother   . Heart attack Brother   . Lung cancer Maternal Grandfather   . Heart attack Paternal Grandmother     MI  ADVANCED DIRECTIVES (Y/N):  '@ADVDIR'$ @  HEALTH MAINTENANCE: Social History  Substance Use Topics  . Smoking status: Former Smoker    Packs/day: 1.00    Years: 50.00    Types: Cigarettes    Quit date: 10/03/2015  . Smokeless tobacco: Never Used     Comment: smokes 3 cigs daily 05/06/15. Pt instructed to quit.  . Alcohol use No     Colonoscopy:  PAP:  Bone density:  Lipid panel:  Allergies  Allergen  Reactions  . Coconut Fatty Acids Swelling    Throat swells    Current Facility-Administered Medications  Medication Dose Route Frequency Provider Last Rate Last Dose  . acetaminophen (TYLENOL) tablet 650 mg  650 mg Oral Q6H PRN Henreitta Leber, MD   650 mg at 02/10/16 0431   Or  . acetaminophen (TYLENOL) suppository 650 mg  650 mg Rectal Q6H PRN Henreitta Leber, MD      . aspirin EC tablet 81 mg  81 mg Oral BH-q7a Henreitta Leber, MD   81 mg at 02/10/16 1037  . atorvastatin (LIPITOR) tablet 20 mg  20 mg Oral QHS Henreitta Leber, MD   20 mg at 02/08/16 2117  . calcium carbonate (TUMS - dosed in mg elemental calcium) chewable tablet 200 mg of elemental calcium  1 tablet Oral TID WC Harrie Foreman, MD   200 mg of elemental calcium at 02/10/16 1153  . diazepam (VALIUM) tablet 5 mg  5 mg Oral Q6H PRN Henreitta Leber, MD      . feeding supplement (ENSURE ENLIVE) (ENSURE ENLIVE) liquid 237 mL  237 mL Oral BID BM Gladstone Lighter, MD   237 mL at 02/10/16 1144  . gabapentin (NEURONTIN) tablet 300 mg  300 mg Oral BID Gladstone Lighter, MD   300 mg at 02/10/16 0840  . insulin aspart (novoLOG) injection 0-5 Units  0-5 Units Subcutaneous QHS Henreitta Leber, MD   2 Units at 02/08/16 2221  . insulin aspart (novoLOG) injection 0-9 Units  0-9 Units Subcutaneous TID WC Henreitta Leber, MD   2 Units at 02/10/16 1153  . ipratropium-albuterol (DUONEB) 0.5-2.5 (3) MG/3ML nebulizer solution 3 mL  3 mL Nebulization Q6H PRN Henreitta Leber, MD      . isosorbide mononitrate (IMDUR) 24 hr tablet 30 mg  30 mg Oral Daily Henreitta Leber, MD   30 mg at 02/10/16 0839  . magnesium oxide (MAG-OX) tablet 400 mg  400 mg Oral BID Gladstone Lighter, MD   400 mg at 02/10/16 0839  . metoCLOPramide (REGLAN) injection 10 mg  10 mg Intravenous TID AC & HS Gladstone Lighter, MD   10 mg at 02/10/16 1153  . mometasone-formoterol (DULERA) 200-5 MCG/ACT inhaler 2 puff  2 puff Inhalation BID Henreitta Leber, MD   2 puff at 02/10/16 1039   . nitroGLYCERIN (NITROSTAT) SL tablet 0.4 mg  0.4 mg Sublingual Q5 min PRN Henreitta Leber, MD      . ondansetron [facility]) injection 2-4 mg  2-4 mg Intravenous Q6H PRN Dustin Flock, MD   2 mg at 02/09/16 2034  . pantoprazole (PROTONIX) EC tablet 40 mg  40 mg Oral Daily Henreitta Leber, MD   40 mg at 02/10/16 0840  . promethazine (PHENERGAN) injection 12.5 mg  12.5 mg Intravenous Q6H PRN Henreitta Leber, MD   12.5 mg at 02/09/16 1720  . tiotropium (SPIRIVA) inhalation capsule 18 mcg  18 mcg Inhalation Daily Gladstone Lighter, MD  18 mcg at 02/10/16 1038   Facility-Administered Medications Ordered in Other Encounters  Medication Dose Route Frequency Provider Last Rate Last Dose  . ondansetron (ZOFRAN) 8 mg, dexamethasone (DECADRON) 10 mg in sodium chloride 0.9 % 50 mL IVPB   Intravenous Once Lloyd Huger, MD        OBJECTIVE: Vitals:   02/10/16 1508 02/10/16 1601  BP:  (!) 156/65  Pulse: 95 87  Resp:  18  Temp:  99.2 F (37.3 C)     Body mass index is 26.42 kg/m.    ECOG FS:2 - Symptomatic, <50% confined to bed  General: Ill-appearing, no acute distress. Eyes: Pink conjunctiva, anicteric sclera. HEENT: Normocephalic, moist mucous membranes, clear oropharnyx. Lungs: Clear to auscultation bilaterally. Heart: Regular rate and rhythm. No rubs, murmurs, or gallops. Abdomen: Soft, nontender, nondistended. No organomegaly noted, normoactive bowel sounds. Musculoskeletal: No edema, cyanosis, or clubbing. Neuro: Alert, answering all questions appropriately. Cranial nerves grossly intact. Skin: No rashes or petechiae noted. Psych: Normal affect.  LAB RESULTS:  Lab Results  Component Value Date   NA 137 02/10/2016   K 3.0 (L) 02/10/2016   CL 98 (L) 02/10/2016   CO2 28 02/10/2016   GLUCOSE 177 (H) 02/10/2016   BUN 25 (H) 02/10/2016   CREATININE 1.64 (H) 02/10/2016   CALCIUM 6.3 (LL) 02/10/2016   PROT 6.3 (L) 02/05/2016   ALBUMIN 3.4 (L) 02/05/2016   AST 19 02/05/2016    ALT 15 02/05/2016   ALKPHOS 72 02/05/2016   BILITOT 0.7 02/05/2016   GFRNONAA 30 (L) 02/10/2016   GFRAA 35 (L) 02/10/2016    Lab Results  Component Value Date   WBC 0.3 (LL) 02/10/2016   NEUTROABS 2.7 02/05/2016   HGB 8.0 (L) 02/10/2016   HCT 22.8 (L) 02/10/2016   MCV 90.9 02/10/2016   PLT 42 (L) 02/10/2016     STUDIES: Dg Chest 2 View  Result Date: 02/10/2016 CLINICAL DATA:  Fever cough and vomiting for 1 day EXAM: CHEST  2 VIEW COMPARISON:  01/21/2016 FINDINGS: Left-sided central venous port tip overlies the SVC. Surgical hardware in the lower cervical spine. Prominent interstitial opacities within the lingula and lung bases are unchanged. No interval consolidation. Development of tiny pleural effusions. Stable cardiomediastinal silhouette. No pneumothorax. IMPRESSION: 1. Development of tiny bilateral effusions 2. No interval consolidation. Electronically Signed   By: Donavan Foil M.D.   On: 02/10/2016 15:03   Dg Chest 2 View  Result Date: 01/21/2016 CLINICAL DATA:  Chronic cough and shortness of breath. Stage III small cell right upper lobe lung cancer. EXAM: CHEST  2 VIEW COMPARISON:  PET-CT 10/23/2015. Chest CT 10/15/2015. Chest radiographs 01/27/2015. FINDINGS: Left jugular Port-A-Cath terminates over the lower SVC. The cardiomediastinal silhouette is within normal limits. Right upper lobe nodule on PET-CT is not well seen on these radiographs. No airspace consolidation, edema, pleural effusion, or pneumothorax is identified. Right upper quadrant abdominal surgical clips and prior ACDF are noted. No acute osseous abnormality is seen. IMPRESSION: No evidence of acute cardiopulmonary process. Electronically Signed   By: Logan Bores M.D.   On: 01/21/2016 12:14   US Renal  Result Date: 02/05/2016 CLINICAL DATA:  Acute renal failure EXAM: RENAL / URINARY TRACT ULTRASOUND COMPLETE COMPARISON:  PET-CT October 23, 2015 FINDINGS: Right Kidney: Length: 10.6 cm. Echogenicity and renal  cortical thickness are within normal limits. No mass, perinephric fluid, or hydronephrosis visualized. There is no sonographically demonstrable calculus or ureterectasis. Left Kidney: Length: 10.1 cm. Echogenicity and renal cortical  thickness are within normal limits. No mass, perinephric fluid, or hydronephrosis visualized. There is no sonographically demonstrable calculus or ureterectasis. Bladder: Appears normal for degree of bladder distention. IMPRESSION: Study within normal limits. Electronically Signed   By: Lowella Grip III M.D.   On: 02/05/2016 16:00    ASSESSMENT: Stage III small cell lung carcinoma of the right upper lobe, intractable nausea and vomiting, diarrhea, acute renal failure.  PLAN:   1. Intractable nausea and vomiting: Improved since admission. Likely secondary patient's chemotherapy which she received approximately 10 days ago.  2. Acute renal failure: Significantly improved with IV fluid resuscitation. Most likely related to hypovolemia and dehydration.  3. Clinical stage IIIa small cell lung carcinoma of the right upper lobe lung: CT and PET scan results reviewed independently concerning for stage IIIa disease given patient's 11 mm right paratracheal lymph node positive for small cell lung cancer. Patient has no other evidence of metastatic disease. MRI the brain is negative. Patient received cycle 3 of cisplatin and etoposide approximately 10 days ago. Her next scheduled chemotherapy is in approximately 2 weeks. She has now completed XRT.  5. Hyperglycemia: Patient's blood sugars significantly elevated today, monitor. May be contributing to patient's nausea and vomiting.  6. Anemia: Improving with blood transfusions, monitor. 7. Thrombocytopenia: Secondary to chemotherapy, monitor. Patient does not require transfusion at this time. 8. Neutropenia: Patient has received Neupogen 4 if no improvement in the next 1-2 days, will consider reinitiating treatment.  Will  follow.

## 2016-02-10 NOTE — Progress Notes (Signed)
Physical Therapy Treatment Patient Details Name: [name] [name] MRN: [id number] DOB: 03-23-1941 Today's Date: 02/10/2016    History of Present Illness [name] [name]  is a 75 y.o. female with a known history of Diabetes, COPD, hypertension, hyperlipidemia, lung cancer currently getting chemotherapy radiation, macular degeneration, history of previous CVA who presented to the hospital due to ongoing nausea vomiting and diarrhea and noted to be in acute renal failure. Patient is currently getting chemoradiation for her lung cancer and had her last chemotherapy treatment about a week ago. She says since this past Friday she is planning having persistent nausea vomiting and has not been able to keep anything down. She went to see her oncologist today at the [facility] was noted to be in acute kidney injury and noted to be acutely dehydrated. She was sent to the hospital for admission. She admits to intermittent abdominal pain associated with her nausea vomiting, but no documented fever, chills. She denies any chest pain, worsening shortness of breath, weight loss, hemoptysis or any other associated symptoms presently. Denies falls in the last 12 months    PT Comments    Pt notes quite a bit of nausea and vomiting today. Agreeable to bed exercises. Pt participates well in supine bed exercises without increasing symptoms. Pt does not require assist for exercises; notes fatigue post exercises. Continue PT for progression of strength and endurance to allow for improved functional mobility once medical issues allow.   Follow Up Recommendations  SNF     Equipment Recommendations  None recommended by PT;Other (comment)    Recommendations for Other Services       Precautions / Restrictions Precautions Precautions: Fall Restrictions Weight Bearing Restrictions: No    Mobility  Bed Mobility               General bed mobility comments: Not tested; pt notes nausea and vomitting all  day  Transfers                    Ambulation/Gait                 Stairs            Wheelchair Mobility    Modified Rankin (Stroke Patients Only)       Balance                                    Cognition Arousal/Alertness: Awake/alert Behavior During Therapy: WFL for tasks assessed/performed Overall Cognitive Status: Within Functional Limits for tasks assessed                      Exercises General Exercises - Lower Extremity Ankle Circles/Pumps: AROM;Both;20 reps;Supine Quad Sets: Strengthening;Both;20 reps;Supine Gluteal Sets: Strengthening;Both;20 reps;Supine Short Arc Quad: AROM;Both;20 reps;Supine Heel Slides: AROM;Both;20 reps;Supine Hip ABduction/ADduction: AROM;Both;Supine;10 reps Straight Leg Raises: AROM;Both;20 reps;Supine    General Comments        Pertinent Vitals/Pain Pain Assessment: No/denies pain    Home Living                      Prior Function            PT Goals (current goals can now be found in the care plan section) Progress towards PT goals: Progressing toward goals    Frequency    Min 2X/week      PT Plan Current plan remains  appropriate    Co-evaluation             End of Session   Activity Tolerance: Patient tolerated treatment well Patient left: in bed;with call bell/phone within reach;with bed alarm set     Time: 0998-3382 PT Time Calculation (min) (ACUTE ONLY): 17 min  Charges:  $Therapeutic Exercise: 8-22 mins                    G Codes:      Larae Grooms, PTA 02/10/2016, 3:23 PM

## 2016-02-10 NOTE — Progress Notes (Signed)
Inpatient Diabetes Program Recommendations  AACE/ADA: New Consensus Statement on Inpatient Glycemic Control (2015)  Target Ranges:  Prepandial:   less than 140 mg/dL      Peak postprandial:   less than 180 mg/dL (1-2 hours)      Critically ill patients:  140 - 180 mg/dL  Results for ASNA, MULDROW (MRN [id number]) as of 02/10/2016 09:34  Ref. Range 02/09/2016 07:23 02/09/2016 11:28 02/09/2016 17:18 02/09/2016 20:24 02/10/2016 07:38  Glucose-Capillary Latest Ref Range: 65 - 99 mg/dL 203 (H) 288 (H) 221 (H) 191 (H) 158 (H)    Review of Glycemic Control  Diabetes history: DM2 Outpatient Diabetes medications: Actos 30 mg daily, Amaryl 4 mg BID, Metformin 1000 mg daily Current orders for Inpatient glycemic control: Novolog 0-9 units TID with meals, Novolog 0-5 units QHS  Inpatient Diabetes Program Recommendations: Correction (SSI): Please consider increasing Novolog correction to Moderate scale.  NOTE: In reviewing chart, glucose ranged from 191-288 mg/dl on 02/09/16 and fasting glucose 158 mg/dl today. Per chart, patient is not consistently eating at least 50% of meals. Recommend increasing Novolog correction to moderate scale and if patient begins eating at least 50% of meals consistently, may want to order Novolog meal coverage in addition to correction.  Thanks, Barnie Alderman, RN, MSN, CDE Diabetes Coordinator Inpatient Diabetes Program [phone] (Team Pager from 8am to 5pm)

## 2016-02-10 NOTE — Progress Notes (Signed)
Chaplain was making his rounds and visited with pt in room 101. Provided emotional support and a spiritual presence.    02/10/16 1340  Clinical Encounter Type  Visited With Patient  Visit Type Initial;Spiritual support  Referral From Nurse  Spiritual Encounters  Spiritual Needs Emotional

## 2016-02-10 NOTE — Progress Notes (Signed)
[facility] [place] NAME: [name] [name]    MR#:  [id number]  DATE OF BIRTH:  05/10/1940  SUBJECTIVE:  CHIEF COMPLAINT:  No chief complaint on file.  - Feels weak, occasional nausea/vomiting present - wbc still low, plts started to drop.   REVIEW OF SYSTEMS:  Review of Systems  Constitutional: Positive for malaise/fatigue. Negative for chills and fever.  HENT: Negative for ear discharge, ear pain, nosebleeds and tinnitus.   Respiratory: Negative for cough, shortness of breath and wheezing.   Cardiovascular: Negative for chest pain and palpitations.  Gastrointestinal: Positive for nausea. Negative for abdominal pain, constipation, diarrhea and vomiting.  Genitourinary: Negative for dysuria and urgency.  Musculoskeletal: Negative for myalgias.  Neurological: Negative for dizziness, sensory change, speech change, focal weakness, seizures and headaches.  Psychiatric/Behavioral: Negative for depression.    DRUG ALLERGIES:   Allergies  Allergen Reactions  . Coconut Fatty Acids Swelling    Throat swells    VITALS:  Blood pressure (!) 143/57, pulse 87, temperature 100.2 F (37.9 C), resp. rate (!) 24, height '5\' 4"'$  (1.626 m), weight 69.8 kg (153 lb 14.4 oz), SpO2 93 %.  PHYSICAL EXAMINATION:  Physical Exam  GENERAL:  75 y.o.-year-old patient lying in the bed with no acute distress.  EYES: Pupils equal, round, reactive to light and accommodation. No scleral icterus. Extraocular muscles intact. Conjunctival pallor HEENT: Head atraumatic, normocephalic. Oropharynx and nasopharynx clear.  NECK:  Supple, no jugular venous distention. No thyroid enlargement, no tenderness.  LUNGS: Normal breath sounds bilaterally, no wheezing, rales,rhonchi or crepitation. No use of accessory muscles of respiration. Diminished breath sounds at the bases. Rhonchi on right side. CARDIOVASCULAR: S1, S2 normal. No  rubs, or gallops. 2/6 systolic murmur is  present ABDOMEN: Soft, nontender, nondistended. Bowel sounds present. No organomegaly or mass.  EXTREMITIES: No pedal edema, cyanosis, or clubbing.  NEUROLOGIC: Cranial nerves II through XII are intact. Muscle strength 5/5 in all extremities. Sensation intact. Gait not checked.  PSYCHIATRIC: The patient is alert and oriented x 3.  SKIN: No obvious rash, lesion, or ulcer.    LABORATORY PANEL:   CBC  Recent Labs Lab 02/10/16 0514  WBC 0.3*  HGB 8.0*  HCT 22.8*  PLT 42*   ------------------------------------------------------------------------------------------------------------------  Chemistries   Recent Labs Lab 02/05/16 1102  02/10/16 0514  NA 132*  < > 137  K 4.5  < > 3.0*  CL 91*  < > 98*  CO2 27  < > 28  GLUCOSE 282*  < > 177*  BUN 83*  < > 25*  CREATININE 3.27*  < > 1.64*  CALCIUM 8.6*  < > 6.3*  MG 1.9  --  0.7*  AST 19  --   --   ALT 15  --   --   ALKPHOS 72  --   --   BILITOT 0.7  --   --   < > = values in this interval not displayed. ------------------------------------------------------------------------------------------------------------------  Cardiac Enzymes No results for input(s): TROPONINI in the last 168 hours. ------------------------------------------------------------------------------------------------------------------  RADIOLOGY:  No results found.  EKG:   Orders placed or performed in visit on 10/29/15  . EKG 12-Lead    ASSESSMENT AND PLAN:   75 year old female with past medical history of CVA, COPD, lung cancer, diabetes, coronary artery disease, macular degeneration who presents to the hospital due to intractable nausea vomiting and noted to be in acute renal failure.   1. Acute kidney injury-secondary to  persistent nausea vomiting and dehydration. Prerenal causes and ATN from chemotherapy - baseline cr of 1, now improved to 1.6 since admission - appreciate nephrology consult, stopped IV fluids today, avoid  nephrotoxins  2. Intractable N/V - due to chemo. Advance diet as tolerated -  Added Reglan prior to eating - supportive care w/ IV fluids, anti-emetics and will monitor.   3. Acute on chronic anemia-secondary to chemotherapy. -Received a total of 2 units of packed RBC transfusion since admission and her hemoglobin is around 8 today, her baseline is around 10 -Monitor closely. We'll keep hemoglobin greater than 8  4. 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 Finished radiation, on cycle 3 of chemotherapy. -Follow up with oncology as outpatient after discharge. - cont inhalers and prn nebs for her wheezing  5. Pancytopenia-with Leukopenia/thrombocytopenia-secondary to the chemotherapy. Discussed with patient's oncologist. No further recommendations. -Neupogen ordered for 4 days. Received plt Tx - 2 units since admission -Monitor closely. If fevers with her neutropenia- will start ABX and order cultures  6. Hypertension-continue Imdur  7. DVT prophylaxis-Ted's and SCDs. Hold off on heparin products due to anemia and thrombocytopenia  8. Severe hypomagnesemia and hypokalemia-being replaced aggressively. Continue to monitor   Physical therapy worked with the patient and recommended rehabilitation.    All the records are reviewed and case discussed with Care Management/Social Workerr. Management plans discussed with the patient, family and they are in agreement.  CODE STATUS: Full code  TOTAL TIME TAKING CARE OF THIS PATIENT: 37 minutes.   POSSIBLE D/C IN 2-3 DAYS, DEPENDING ON CLINICAL CONDITION.   Gladstone Lighter M.D on 02/10/2016 at 12:57 PM  Between 7am to 6pm - Pager - [phone]  After 6pm go to [email or web address] - password [email or web address]  Office  [phone]  CC: Primary care physician; [name], MD

## 2016-02-11 LAB — CBC WITH DIFFERENTIAL/PLATELET
Basophils Absolute: 0 10*3/uL (ref 0–0.1)
Basophils Relative: 0 %
EOS PCT: 4 %
Eosinophils Absolute: 0 10*3/uL (ref 0–0.7)
HCT: 23.7 % — ABNORMAL LOW (ref 35.0–47.0)
Hemoglobin: 8.2 g/dL — ABNORMAL LOW (ref 12.0–16.0)
LYMPHS ABS: 0.2 10*3/uL — AB (ref 1.0–3.6)
Lymphocytes Relative: 84 %
MCH: 31.8 pg (ref 26.0–34.0)
MCHC: 34.5 g/dL (ref 32.0–36.0)
MCV: 92.3 fL (ref 80.0–100.0)
MONO ABS: 0 10*3/uL — AB (ref 0.2–0.9)
Monocytes Relative: 5 %
NEUTROS ABS: 0 10*3/uL — AB (ref 1.4–6.5)
Neutrophils Relative %: 7 %
PLATELETS: 25 10*3/uL — AB (ref 150–440)
RBC: 2.57 MIL/uL — AB (ref 3.80–5.20)
RDW: 17 % — AB (ref 11.5–14.5)
WBC: 0.2 10*3/uL — AB (ref 3.6–11.0)

## 2016-02-11 LAB — GLUCOSE, CAPILLARY
Glucose-Capillary: 231 mg/dL — ABNORMAL HIGH (ref 65–99)
Glucose-Capillary: 408 mg/dL — ABNORMAL HIGH (ref 65–99)
Glucose-Capillary: 417 mg/dL — ABNORMAL HIGH (ref 65–99)
Glucose-Capillary: 245 mg/dL — ABNORMAL HIGH (ref 65–99)
Glucose-Capillary: 258 mg/dL — ABNORMAL HIGH (ref 65–99)

## 2016-02-11 LAB — MAGNESIUM: Magnesium: 1 mg/dL — ABNORMAL LOW (ref 1.7–2.4)

## 2016-02-11 LAB — BASIC METABOLIC PANEL
Anion gap: 10 (ref 5–15)
BUN: 27 mg/dL — AB (ref 6–20)
CHLORIDE: 98 mmol/L — AB (ref 101–111)
CO2: 30 mmol/L (ref 22–32)
CREATININE: 1.48 mg/dL — AB (ref 0.44–1.00)
Calcium: 6.5 mg/dL — ABNORMAL LOW (ref 8.9–10.3)
GFR calc Af Amer: 39 mL/min — ABNORMAL LOW (ref >60)
GFR calc non Af Amer: 34 mL/min — ABNORMAL LOW (ref >60)
Glucose, Bld: 183 mg/dL — ABNORMAL HIGH (ref 65–99)
Potassium: 3.5 mmol/L (ref 3.5–5.1)
Sodium: 138 mmol/L (ref 135–145)

## 2016-02-11 LAB — PHOSPHORUS: Phosphorus: 2.4 mg/dL — ABNORMAL LOW (ref 2.5–4.6)

## 2016-02-11 MED ORDER — INSULIN GLARGINE 100 UNIT/ML ~~LOC~~ SOLN
12.0000 [IU] | Freq: Every day | SUBCUTANEOUS | Status: DC
  Administered 2016-02-11 – 2016-02-12 (×2): 12 [IU] via SUBCUTANEOUS
  Filled 2016-02-11 (×3): qty 0.12

## 2016-02-11 MED ORDER — INSULIN ASPART 100 UNIT/ML ~~LOC~~ SOLN
0.0000 [IU] | Freq: Three times a day (TID) | SUBCUTANEOUS | Status: DC
  Administered 2016-02-11: 5 [IU] via SUBCUTANEOUS
  Administered 2016-02-12: 08:00:00 3 [IU] via SUBCUTANEOUS
  Administered 2016-02-12: 8 [IU] via SUBCUTANEOUS
  Administered 2016-02-12: 17:00:00 2 [IU] via SUBCUTANEOUS
  Administered 2016-02-13: 09:00:00 3 [IU] via SUBCUTANEOUS
  Administered 2016-02-13: 13:00:00 11 [IU] via SUBCUTANEOUS
  Filled 2016-02-11 (×2): qty 3
  Filled 2016-02-11: qty 2
  Filled 2016-02-11: qty 5
  Filled 2016-02-11: qty 11
  Filled 2016-02-11: qty 8

## 2016-02-11 MED ORDER — ONDANSETRON HCL 4 MG/2ML IJ SOLN: 4.0000 mg | Freq: Four times a day (QID) | INTRAMUSCULAR | Status: DC | PRN

## 2016-02-11 MED ORDER — PROMETHAZINE HCL 25 MG/ML IJ SOLN
12.5000 mg | Freq: Four times a day (QID) | INTRAMUSCULAR | Status: DC | PRN
Start: 2016-02-11 — End: 2016-02-13

## 2016-02-11 MED ORDER — MAGNESIUM SULFATE 4 GM/100ML IV SOLN
4.0000 g | Freq: Once | INTRAVENOUS | Status: AC
  Administered 2016-02-11: 08:00:00 4 g via INTRAVENOUS
  Filled 2016-02-11: qty 100

## 2016-02-11 MED ORDER — K PHOS MONO-SOD PHOS DI & MONO 155-852-130 MG PO TABS
500.0000 mg | ORAL_TABLET | Freq: Four times a day (QID) | ORAL | Status: AC
  Administered 2016-02-11 – 2016-02-12 (×2): 500 mg via ORAL
  Filled 2016-02-11 (×3): qty 2
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
## 2016-02-11 NOTE — Progress Notes (Signed)
Pharmacy Consult for electrolyte replacement  Allergies  Allergen Reactions  . Coconut Fatty Acids Swelling    Throat swells   Labs:  Recent Labs  02/09/16 0527 02/10/16 0514 02/11/16 0345  WBC 0.6* 0.3* 0.2*  HGB 8.5* 8.0* 8.2*  HCT 24.1* 22.8* 23.7*  PLT 80* 42* 25*     Recent Labs  02/09/16 0527 02/10/16 0514 02/11/16 0345 02/11/16 0347  NA 137 137 138  --   K 3.4* 3.0* 3.5  --   CL 102 98* 98*  --   CO2 '28 28 30  '$ --   GLUCOSE 166* 177* 183*  --   BUN 28* 25* 27*  --   CREATININE 1.60* 1.64* 1.48*  --   CALCIUM 6.6* 6.3* 6.5*  --   MG  --  0.7* 1.0*  --   PHOS  --   --   --  2.4*   Estimated Creatinine Clearance: 32.4 mL/min (by C-G formula based on SCr of 1.48 mg/dL (H)).   Assessment: Pharmacy consulted to replace electrolytes in this 75 year old female admitted with intractable nausea and vomiting due to chemotherapy  Plan:  Potassium WNL today, magnesium and phos low. RN reports not vomiting or diarrhea today. Magnesium supplemented IV, will give K phos x 2 doses, recheck electrolytes with AM labs.   Ernestene Coover C 02/11/2016,3:03 PM

## 2016-02-11 NOTE — Progress Notes (Signed)
Physical Therapy Treatment Patient Details Name: [name] [name] MRN: [id number] DOB: 1940/07/05 Today's Date: 02/11/2016    History of Present Illness [name] [name]  is a 75 y.o. female with a known history of Diabetes, COPD, hypertension, hyperlipidemia, lung cancer currently getting chemotherapy radiation, macular degeneration, history of previous CVA who presented to the hospital due to ongoing nausea vomiting and diarrhea and noted to be in acute renal failure. Patient is currently getting chemoradiation for her lung cancer and had her last chemotherapy treatment about a week ago. She says since this past Friday she is planning having persistent nausea vomiting and has not been able to keep anything down. She went to see her oncologist today at the [facility] was noted to be in acute kidney injury and noted to be acutely dehydrated. She was sent to the hospital for admission. She admits to intermittent abdominal pain associated with her nausea vomiting, but no documented fever, chills. She denies any chest pain, worsening shortness of breath, weight loss, hemoptysis or any other associated symptoms presently. Denies falls in the last 12 months    PT Comments    Pt agreeable to PT. Pt progressing to up in bed and out of bed activities participating in seated exercises and ambulation. Initial O2 sats on room air 88%, but with seated activity and pursed lip breathing initially O2 saturation increases to 93% and remains 93-94% throughout session. Pt requires safety cues for sit to/from stand transfers for safe hand placement. Ambulation requires Min guard primarily, but Min A for turns due to very narrow base of support and unsteadiness. Pt does require several short stand rest breaks during a 73 foot walk. Pt received up in chair. Continue PT to progress strength and endurance to improve all functional mobility.   Follow Up Recommendations  SNF     Equipment Recommendations  None recommended by  PT;Other (comment)    Recommendations for Other Services       Precautions / Restrictions Precautions Precautions: Fall Restrictions Weight Bearing Restrictions: No    Mobility  Bed Mobility Overal bed mobility: Modified Independent         Sit to supine: Modified independent (Device/Increase time)   General bed mobility comments: Use of rails  Transfers Overall transfer level: Needs assistance Equipment used: Rolling walker (2 wheeled) Transfers: Sit to/from Stand Sit to Stand: Min guard         General transfer comment: Cues for safe hand placement with stand and sit  Ambulation/Gait Ambulation/Gait assistance: Min guard;Min assist Ambulation Distance (Feet): 72 Feet Assistive device: Rolling walker (2 wheeled) Gait Pattern/deviations: Step-through pattern;Decreased stride length;Narrow base of support Gait velocity: Decreased Gait velocity interpretation: <1.8 ft/sec, indicative of risk for recurrent falls General Gait Details: Slow pace with decreased stride and feet touching narrow base of support and at times tandem steps. Requires several short stand rest breaks. Requires Min A on turn for steadiness    Stairs            Wheelchair Mobility    Modified Rankin (Stroke Patients Only)       Balance Overall balance assessment: Needs assistance Sitting-balance support: Feet supported Sitting balance-Leahy Scale: Good     Standing balance support: Bilateral upper extremity supported Standing balance-Leahy Scale: Fair                      Cognition Arousal/Alertness: Awake/alert Behavior During Therapy: WFL for tasks assessed/performed Overall Cognitive Status: Within Functional Limits for tasks assessed  Exercises General Exercises - Lower Extremity Long Arc Quad: AROM;Strengthening;Both;20 reps;Seated Hip ABduction/ADduction: AROM;Both;20 reps;Seated (90/90 position) Hip Flexion/Marching: AROM;Both;20  reps;Seated Toe Raises: AROM;Both;20 reps;Seated Heel Raises: AROM;Both;20 reps;Seated Other Exercises Other Exercises: deep pursed lip breathing    General Comments        Pertinent Vitals/Pain Pain Assessment: No/denies pain    Home Living                      Prior Function            PT Goals (current goals can now be found in the care plan section)      Frequency           PT Plan Current plan remains appropriate    Co-evaluation             End of Session Equipment Utilized During Treatment: Gait belt Activity Tolerance: Patient limited by fatigue Patient left: in chair;with chair alarm set;with call bell/phone within reach     Time: 1145-1208 PT Time Calculation (min) (ACUTE ONLY): 23 min  Charges:  $Gait Training: 8-22 mins $Therapeutic Exercise: 8-22 mins                    G Codes:      Larae Grooms, PTA 02/11/2016, 1:19 PM

## 2016-02-11 NOTE — Plan of Care (Signed)
Problem: Fluid Volume: Goal: Ability to maintain a balanced intake and output will improve Outcome: Not Progressing Continues to have poor intake with nausea relieved by IV reglan  Problem: Nutrition: Goal: Adequate nutrition will be maintained Outcome: Not Progressing Poor intake  Problem: Bowel/Gastric: Goal: Will not experience complications related to bowel motility Outcome: Progressing LBM 11/6

## 2016-02-11 NOTE — Progress Notes (Signed)
Inpatient Diabetes Program Recommendations  AACE/ADA: New Consensus Statement on Inpatient Glycemic Control (2015)  Target Ranges:  Prepandial:   less than 140 mg/dL      Peak postprandial:   less than 180 mg/dL (1-2 hours)      Critically ill patients:  140 - 180 mg/dL   Lab Results  Component Value Date   GLUCAP 231 (H) 02/11/2016   HGBA1C 9.8 01/09/2016    Review of Glycemic Control  Results for [name] [name], [name] [name] (MRN [id number]) as of 02/11/2016 07:36  Ref. Range 02/10/2016 07:38 02/10/2016 11:42 02/10/2016 16:53 02/10/2016 20:56 02/11/2016 07:28  Glucose-Capillary Latest Ref Range: 65 - 99 mg/dL 158 (H) 189 (H) 226 (H) 268 (H) 231 (H)   Diabetes history: Type 2  Outpatient Diabetes medications: Actos '30mg'$  q day, Amaryl '4mg'$  bid, Metformin '1000mg'$  qday  Current orders for Inpatient glycemic control: Novolog sensitive correction 0-9 units tid, Novolog 0-5 units qhs  Inpatient Diabetes Program Recommendations: Consistently high blood sugars.  Consider increasing correction insulin to moderate correction scale (0-15 units tid)   Gentry Fitz, RN, [place], [place], CDE Diabetes Coordinator Inpatient Glycemic Control Team [phone] (Team Pager) [phone] ([place]) 02/11/2016 7:39 AM

## 2016-02-11 NOTE — Progress Notes (Signed)
Nutrition Follow-up  DOCUMENTATION CODES:   Severe malnutrition in context of acute illness/injury  INTERVENTION:  1. Continue Ensure Enlive po BID, each supplement provides 350 kcal and 20 grams of protein  NUTRITION DIAGNOSIS:   Malnutrition related to acute illness as evidenced by percent weight loss, energy intake < or equal to 50% for > or equal to 5 days. -ongoing  GOAL:   Patient will meet greater than or equal to 90% of their needs -not meeting  MONITOR:   PO intake, Supplement acceptance  REASON FOR ASSESSMENT:   Malnutrition Screening Tool    ASSESSMENT:   [name] [name]  is a 75 y.o. female with a known history of Diabetes, COPD, hypertension, hyperlipidemia, lung cancer currently getting chemotherapy radiation, macular degeneration, history of previous CVA who presented to the hospital due to ongoing nausea vomiting and diarrhea and noted to be in acute renal failure.  Pt ate 100% of her breakfast this morning, seems to be doing better but states her appetite comes and goes.  She also complains of nausea when trying to put her dentures back in, claims this is new. No new wts Consuming Ensure thus far. Labs and medications reviewed: CBGs 231-408 Mg 1.0 CaCarbonate, Reglan  Diet Order:  DIET SOFT Room service appropriate? Yes; Fluid consistency: Thin  Skin:  Reviewed, no issues  Last BM:  10/28  Height:   Ht Readings from Last 1 Encounters:  02/05/16 '5\' 4"'$  (1.626 m)    Weight:   Wt Readings from Last 1 Encounters:  02/05/16 153 lb 14.4 oz (69.8 kg)    Ideal Body Weight:     BMI:  Body mass index is 26.42 kg/m.  Estimated Nutritional Needs:   Kcal:  2100-2450 kcals/d  Protein:  84-105 g/d  Fluid:  >/= 2.1 L/d  EDUCATION NEEDS:   Education needs addressed  Satira Anis. Zaleah Ternes, MS, RD LDN Inpatient Clinical Dietitian Pager [phone]

## 2016-02-11 NOTE — Progress Notes (Addendum)
[facility] at [place] NAME: [name] [name]    MR#:  [id number]  DATE OF BIRTH:  June 17, 1940  SUBJECTIVE:  CHIEF COMPLAINT:  No chief complaint on file.  - wbc remains low, no vomiting, but has nausea symptoms. No fevers today - cultures negative so far - Plts dropping again. Blood sugars are elevated  REVIEW OF SYSTEMS:  Review of Systems  Constitutional: Positive for malaise/fatigue. Negative for chills and fever.  HENT: Negative for ear discharge, ear pain, nosebleeds and tinnitus.   Respiratory: Negative for cough, shortness of breath and wheezing.   Cardiovascular: Negative for chest pain and palpitations.  Gastrointestinal: Positive for nausea. Negative for abdominal pain, constipation, diarrhea and vomiting.  Genitourinary: Negative for dysuria and urgency.  Musculoskeletal: Negative for myalgias.  Neurological: Negative for dizziness, sensory change, speech change, focal weakness, seizures and headaches.  Psychiatric/Behavioral: Negative for depression.    DRUG ALLERGIES:   Allergies  Allergen Reactions  . Coconut Fatty Acids Swelling    Throat swells    VITALS:  Blood pressure (!) 138/57, pulse 89, temperature 98.9 F (37.2 C), temperature source Oral, resp. rate 20, height '5\' 4"'$  (1.626 m), weight 69.8 kg (153 lb 14.4 oz), SpO2 95 %.  PHYSICAL EXAMINATION:  Physical Exam  GENERAL:  75 y.o.-year-old patient lying in the bed with no acute distress.  EYES: Pupils equal, round, reactive to light and accommodation. No scleral icterus. Extraocular muscles intact. Conjunctival pallor HEENT: Head atraumatic, normocephalic. Oropharynx and nasopharynx clear.  NECK:  Supple, no jugular venous distention. No thyroid enlargement, no tenderness.  LUNGS: Normal breath sounds bilaterally, no wheezing, rales,rhonchi or crepitation. No use of accessory muscles of respiration. Diminished breath sounds at the bases. Rhonchi on right  side. CARDIOVASCULAR: S1, S2 normal. No  rubs, or gallops. 2/6 systolic murmur is present ABDOMEN: Soft, nontender, nondistended. Bowel sounds present. No organomegaly or mass.  EXTREMITIES: No pedal edema, cyanosis, or clubbing.  NEUROLOGIC: Cranial nerves II through XII are intact. Muscle strength 5/5 in all extremities. Sensation intact. Gait not checked.  PSYCHIATRIC: The patient is alert and oriented x 3.  SKIN: No obvious rash, lesion, or ulcer. Tiny petechia noted on both hands on the dorsal surfaces   LABORATORY PANEL:   CBC  Recent Labs Lab 02/11/16 0345  WBC 0.2*  HGB 8.2*  HCT 23.7*  PLT 25*   ------------------------------------------------------------------------------------------------------------------  Chemistries   Recent Labs Lab 02/05/16 1102  02/11/16 0345  NA 132*  < > 138  K 4.5  < > 3.5  CL 91*  < > 98*  CO2 27  < > 30  GLUCOSE 282*  < > 183*  BUN 83*  < > 27*  CREATININE 3.27*  < > 1.48*  CALCIUM 8.6*  < > 6.5*  MG 1.9  < > 1.0*  AST 19  --   --   ALT 15  --   --   ALKPHOS 72  --   --   BILITOT 0.7  --   --   < > = values in this interval not displayed. ------------------------------------------------------------------------------------------------------------------  Cardiac Enzymes No results for input(s): TROPONINI in the last 168 hours. ------------------------------------------------------------------------------------------------------------------  RADIOLOGY:  Dg Chest 2 View  Result Date: 02/10/2016 CLINICAL DATA:  Fever cough and vomiting for 1 day EXAM: CHEST  2 VIEW COMPARISON:  01/21/2016 FINDINGS: Left-sided central venous port tip overlies the SVC. Surgical hardware in the lower cervical spine. Prominent interstitial opacities within the  lingula and lung bases are unchanged. No interval consolidation. Development of tiny pleural effusions. Stable cardiomediastinal silhouette. No pneumothorax. IMPRESSION: 1. Development of tiny  bilateral effusions 2. No interval consolidation. Electronically Signed   By: Donavan Foil M.D.   On: 02/10/2016 15:03    EKG:   Orders placed or performed in visit on 10/29/15  . EKG 12-Lead    ASSESSMENT AND PLAN:   75 year old female with past medical history of CVA, COPD, lung cancer, diabetes, coronary artery disease, macular degeneration who presents to the hospital due to intractable nausea vomiting and noted to be in acute renal failure.  1. Acute kidney injury-secondary to persistent nausea vomiting and dehydration. Prerenal causes and ATN from chemotherapy - baseline cr of 1, now improved to 1.4 since admission - appreciate nephrology consult, stopped IV fluids today, avoid nephrotoxins  2. Intractable N/V - due to chemo. Advance diet as tolerated -  Added Reglan prior to eating - supportive care w/ IV fluids, anti-emetics and will monitor.   3. Acute on chronic anemia-secondary to chemotherapy. -Received a total of 2 units of packed RBC transfusion since admission and her hemoglobin is around 8, her baseline is around 10 -Monitor closely. We'll keep hemoglobin greater than 8  4. 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 Finished radiation, on cycle 3 of chemotherapy. -Follow up with oncology as outpatient after discharge. - cont inhalers and prn nebs for her wheezing  5. Pancytopenia-with Leukopenia/thrombocytopenia-secondary to the chemotherapy. Discussed with patient's oncologist. No further recommendations. -Neupogen finished by yesterday- 4 days. If no improvement by tomorrow -Received plt Tx - 2 units since admission. Platelets have dropped again down to 25K today, if they drop less than 20 K we'll order transfusion again -Monitor closely. Low-grade fever yesterday, ordered blood cultures, chest x-ray and urinalysis. Negative findings so far. Not on antibiotics. Monitor.  6. Hyperglycemia- new onset diabetes, a1c is 9.8 - sugars are  elevated as patient drinking mostly sweetened stuff due to her nausea. Start lantus, change SSI to moderate scale. If still elevated- change diet to Carb modified  7. DVT prophylaxis-Ted's and SCDs. Hold off on heparin products due to anemia and thrombocytopenia  8. Severe hypomagnesemia and hypokalemia-being replaced aggressively. Continue to monitor   Physical therapy worked with the patient and recommended rehabilitation. Discharge once her blood counts have improved   All the records are reviewed and case discussed with Care Management/Social Workerr. Management plans discussed with the patient, family and they are in agreement.  CODE STATUS: Full code  TOTAL TIME TAKING CARE OF THIS PATIENT: 37 minutes.   POSSIBLE D/C IN 2-3 DAYS, DEPENDING ON CLINICAL CONDITION and as her blood counts improve.   Gladstone Lighter M.D on 02/11/2016 at 12:30 PM  Between 7am to 6pm - Pager - [phone]  After 6pm go to [email or web address] - password [facility]  Office  [phone]  CC: Primary care physician; [name], MD

## 2016-02-11 NOTE — Progress Notes (Signed)
Took over pt care at 1530, pt alert and oriented, no complaints of pain, no distress or discomfort noted

## 2016-02-11 NOTE — Clinical Social Work Note (Signed)
Clinical Social Work Assessment  Patient Details  Name: [name] [name] MRN: [id number] Date of Birth: May 25, 1940  Date of referral:  02/11/16               Reason for consult:  Discharge Planning, Facility Placement                Permission sought to share information with:  Case Manager, Customer service manager, Family Supports Permission granted to share information::  Yes, Verbal Permission Granted  Name::        Agency::     Relationship::  Husband  Contact Information:     Housing/Transportation Living arrangements for the past 2 months:  Single Family Home (lives in a one level trailer) Source of Information:  Patient, Medical Team Patient Interpreter Needed:  None Criminal Activity/Legal Involvement Pertinent to Current Situation/Hospitalization:  No - Comment as needed Significant Relationships:  Adult Children, Other Family Members, Spouse Lives with:  Spouse Do you feel safe going back to the place where you live?  Yes Need for family participation in patient care:  No (Coment)  Care giving concerns:  Patient reports she lives with her husband at home in a one level trailer.  Patient reports husband is very involved in care and does support patient for chemo treatments and with help at home. Patient reports she was independent with ADLs at home. Reports she gave herself a bath this morning and has not gotten up today, but hopes to walk with her walker today.  Patient uses a walker and wheelchair at the home and can get around with DME. Reports no concerns at this time and wants to go home to be with husband and her poodle. Reports she does not want to be around a lot of people and due to low immunity with the chemotherapy (every three weeks) she reports she does not want to get sick.   Social Worker assessment / plan:  LCSW received consult for possible facility placement. Met with patient alone in room at the bedside, and educated her on reason for consult and SW  role while in hospital. Patient very pleasant, alert and oriented and engaged in assessment. Patient very clear about not wanting to go to SNF, but return home and possibly have [place] involved if she qualifies. Reports husband can help provide care. LCSW will continue to follow.  Patient appreciative and reports overall today she is feeling better, but still struggling to eat and keep food down. CM made aware of assessment of needs and preference from patient.  Plan: wants to DC home.  Employment status:  Retired Nurse, adult PT Recommendations:  [place] / Referral to community resources:  [place]  Patient/Family's Response to care:  Agreeable to plan: home  Patient/Family's Understanding of and Emotional Response to Diagnosis, Current Treatment, and Prognosis:  Patient very aware of her prognosis and current condition as it all relates to her chemotherapy. She processes her emotions with chemo and how it makes her feel. She is able to show future oriented thinking with goals for self and hopes to return home.  Emotional Assessment Appearance:  Appears stated age Attitude/Demeanor/Rapport:  Other (engaged, cooperative, open to assessment) Affect (typically observed):  Accepting, Adaptable, Hopeful, Pleasant, Calm Orientation:  Oriented to Self, Oriented to Place, Oriented to  Time, Oriented to Situation Alcohol / Substance use:  Not Applicable Psych involvement (Current and /or in the community):  No (Comment)  Discharge Needs  Concerns to be addressed:  No discharge needs identified Readmission within the last 30 days:  No Current discharge risk:  None Barriers to Discharge:  No Barriers Identified, Continued Medical Work up   [place], LCSW 02/11/2016, 10:28 AM

## 2016-02-11 NOTE — Progress Notes (Signed)
[facility]  ROUNDING NOTE   Subjective:  Patient feeling better today. She states that her nausea and vomiting have improved. Renal function about the same. Good urine output noted.   Objective:  Vital signs in last 24 hours:  Temp:  [98.1 F (36.7 C)-99.2 F (37.3 C)] 98.9 F (37.2 C) (11/07 0456) Pulse Rate:  [87-100] 100 (11/07 1145) Resp:  [18-23] 20 (11/07 0456) BP: (137-156)/(49-65) 138/57 (11/07 0456) SpO2:  [90 %-95 %] 93 % (11/07 1145)  Weight change:  Filed Weights   02/05/16 1511  Weight: 69.8 kg (153 lb 14.4 oz)    Intake/Output: I/O last 3 completed shifts: In: 360 [P.O.:360] Out: 3150 [Urine:3150]   Intake/Output this shift:  Total I/O In: 340 [P.O.:240; IV Piggyback:100] Out: 200 [Urine:200]  Physical Exam: General: NAD  Head: Normocephalic, atraumatic. Moist oral mucosal membranes  Eyes: Anicteric  Neck: Supple, trachea midline  Lungs:  Clear to auscultation, Normal effort   Heart: Regular rate and rhythm  Abdomen:  Soft, nontender, Bowel sounds present   Extremities: no peripheral edema.  Neurologic: Nonfocal, moving all four extremities  Skin: No lesions       Basic Metabolic Panel:  Recent Labs Lab 02/05/16 1102  02/07/16 0515 02/08/16 0524 02/09/16 0527 02/10/16 0514 02/11/16 0345  NA 132*  < > 138 136 137 137 138  K 4.5  < > 3.6 3.1* 3.4* 3.0* 3.5  CL 91*  < > 102 102 102 98* 98*  CO2 27  < > '28 26 28 28 30  '$ GLUCOSE 282*  < > 151* 181* 166* 177* 183*  BUN 83*  < > 55* 41* 28* 25* 27*  CREATININE 3.27*  < > 2.34* 2.11* 1.60* 1.64* 1.48*  CALCIUM 8.6*  < > 7.3* 6.8* 6.6* 6.3* 6.5*  MG 1.9  --   --   --   --  0.7* 1.0*  < > = values in this interval not displayed.  Liver Function Tests:  Recent Labs Lab 02/05/16 1102  AST 19  ALT 15  ALKPHOS 72  BILITOT 0.7  PROT 6.3*  ALBUMIN 3.4*   No results for input(s): LIPASE, AMYLASE in the last 168 hours. No results for input(s): AMMONIA in the last 168  hours.  CBC:  Recent Labs Lab 02/05/16 1102  02/07/16 0515 02/08/16 0524 02/09/16 0527 02/10/16 0514 02/11/16 0345  WBC 3.1*  < > 1.0* 1.0* 0.6* 0.3* 0.2*  NEUTROABS 2.7  --   --   --   --   --  0.0*  HGB 7.7*  < > 7.3* 8.7* 8.5* 8.0* 8.2*  HCT 22.2*  < > 20.9* 24.5* 24.1* 22.8* 23.7*  MCV 95.5  < > 92.0 91.8 92.1 90.9 92.3  PLT 75*  < > 28* 12* 80* 42* 25*  < > = values in this interval not displayed.  Cardiac Enzymes: No results for input(s): CKTOTAL, CKMB, CKMBINDEX, TROPONINI in the last 168 hours.  BNP: Invalid input(s): POCBNP  CBG:  Recent Labs Lab 02/10/16 1653 02/10/16 2056 02/11/16 0728 02/11/16 1140 02/11/16 1143  GLUCAP 226* 268* 231* 417* 408*    Microbiology: Results for orders placed or performed during the hospital encounter of 02/05/16  CULTURE, BLOOD (ROUTINE X 2) w Reflex to ID Panel     Status: None (Preliminary result)   Collection Time: 02/10/16  1:56 PM  Result Value Ref Range Status   Specimen Description BLOOD R ARM  Final   Special Requests   Final  BOTTLES DRAWN AEROBIC AND ANAEROBIC AER 3ML ANA 1ML   Culture NO GROWTH < 24 HOURS  Final   Report Status PENDING  Incomplete  CULTURE, BLOOD (ROUTINE X 2) w Reflex to ID Panel     Status: None (Preliminary result)   Collection Time: 02/10/16  2:11 PM  Result Value Ref Range Status   Specimen Description BLOOD L HAND  Final   Special Requests   Final    BOTTLES DRAWN AEROBIC AND ANAEROBIC AER 3ML ANA 2ML   Culture NO GROWTH < 24 HOURS  Final   Report Status PENDING  Incomplete    Coagulation Studies: No results for input(s): LABPROT, INR in the last 72 hours.  Urinalysis:  Recent Labs  02/10/16 2055  COLORURINE STRAW*  LABSPEC 1.010  PHURINE 7.0  GLUCOSEU >500*  HGBUR 1+*  BILIRUBINUR NEGATIVE  KETONESUR NEGATIVE  PROTEINUR 100*  NITRITE NEGATIVE  LEUKOCYTESUR NEGATIVE      Imaging: Dg Chest 2 View  Result Date: 02/10/2016 CLINICAL DATA:  Fever cough and vomiting  for 1 day EXAM: CHEST  2 VIEW COMPARISON:  01/21/2016 FINDINGS: Left-sided central venous port tip overlies the SVC. Surgical hardware in the lower cervical spine. Prominent interstitial opacities within the lingula and lung bases are unchanged. No interval consolidation. Development of tiny pleural effusions. Stable cardiomediastinal silhouette. No pneumothorax. IMPRESSION: 1. Development of tiny bilateral effusions 2. No interval consolidation. Electronically Signed   By: Donavan Foil M.D.   On: 02/10/2016 15:03     Medications:    . aspirin EC  81 mg Oral BH-q7a  . atorvastatin  20 mg Oral QHS  . calcium carbonate  1 tablet Oral TID WC  . feeding supplement (ENSURE ENLIVE)  237 mL Oral BID BM  . gabapentin  300 mg Oral BID  . insulin aspart  0-15 Units Subcutaneous TID WC  . insulin aspart  0-5 Units Subcutaneous QHS  . insulin glargine  12 Units Subcutaneous QHS  . isosorbide mononitrate  30 mg Oral Daily  . metoCLOPramide (REGLAN) injection  10 mg Intravenous TID AC & HS  . mometasone-formoterol  2 puff Inhalation BID  . pantoprazole  40 mg Oral Daily  . tiotropium  18 mcg Inhalation Daily   acetaminophen **OR** acetaminophen, diazepam, ipratropium-albuterol, nitroGLYCERIN, ondansetron (ZOFRAN) IV, promethazine  Assessment/ Plan:  [name] [name] [name] is a 75 y.o. white female with Diabetes, COPD, hypertension, lung cancer treated with radiation and chemotherapy, macular degeneration, history of stroke, was admitted on 02/05/2016 with dehydration, nausea, vomiting postchemotherapy.   1. Acute renal failure on chronic kidney disease stage III: baseline creatinine is 1.02 from 01/21/2016/GFR 53 Acute kidney injury is likely secondary to volume depletion and possibly ATN post-chemotherapy from nausea and vomiting -  Creatinine remains above baseline however good urine output is noted.  Continue to monitor renal function periodically and avoid nephrotoxins as possible..  2.  Hypertension: blood pressure currently 138/57. - holding losartan and hydrochlorothiazide due to acute renal failure.   3. Diabetes mellitus type II with chronic kidney disease: continue to hold metformin now given acute renal failure.  4. Lung cancer: status post chemotherapy.  - Followed by Dr. [name].   5.  Hypokalemia. Serum potassium up to 3.5 post repletion.  Continue to monitor.   LOS: Bayard, [name] [name] [name] 11/7/20171:56 PM

## 2016-02-12 LAB — MAGNESIUM: Magnesium: 1.7 mg/dL (ref 1.7–2.4)

## 2016-02-12 LAB — CBC WITH DIFFERENTIAL/PLATELET
BASOS PCT: 0 %
Basophils Absolute: 0 10*3/uL (ref 0–0.1)
EOS ABS: 0 10*3/uL (ref 0–0.7)
EOS PCT: 3 %
HCT: 21.6 % — ABNORMAL LOW (ref 35.0–47.0)
HEMOGLOBIN: 7.7 g/dL — AB (ref 12.0–16.0)
Lymphocytes Relative: 75 %
Lymphs Abs: 0.2 10*3/uL — ABNORMAL LOW (ref 1.0–3.6)
MCH: 32 pg (ref 26.0–34.0)
MCHC: 35.5 g/dL (ref 32.0–36.0)
MCV: 90.2 fL (ref 80.0–100.0)
MONO ABS: 0 10*3/uL — AB (ref 0.2–0.9)
Monocytes Relative: 4 %
NEUTROS ABS: 0.1 10*3/uL — AB (ref 1.4–6.5)
NEUTROS PCT: 18 %
PLATELETS: 15 10*3/uL — AB (ref 150–440)
RBC: 2.39 MIL/uL — ABNORMAL LOW (ref 3.80–5.20)
RDW: 16.7 % — ABNORMAL HIGH (ref 11.5–14.5)
WBC: 0.3 10*3/uL — CL (ref 3.6–11.0)

## 2016-02-12 LAB — GLUCOSE, CAPILLARY
GLUCOSE-CAPILLARY: 262 mg/dL — AB (ref 65–99)
GLUCOSE-CAPILLARY: 193 mg/dL — AB (ref 65–99)
Glucose-Capillary: 171 mg/dL — ABNORMAL HIGH (ref 65–99)
Glucose-Capillary: 192 mg/dL — ABNORMAL HIGH (ref 65–99)

## 2016-02-12 LAB — BASIC METABOLIC PANEL
Anion gap: 11 (ref 5–15)
BUN: 32 mg/dL — AB (ref 6–20)
CHLORIDE: 95 mmol/L — AB (ref 101–111)
CO2: 30 mmol/L (ref 22–32)
CREATININE: 1.55 mg/dL — AB (ref 0.44–1.00)
Calcium: 6.5 mg/dL — ABNORMAL LOW (ref 8.9–10.3)
GFR calc Af Amer: 37 mL/min — ABNORMAL LOW (ref >60)
GFR calc non Af Amer: 32 mL/min — ABNORMAL LOW (ref >60)
GLUCOSE: 159 mg/dL — AB (ref 65–99)
POTASSIUM: 3.2 mmol/L — AB (ref 3.5–5.1)
SODIUM: 136 mmol/L (ref 135–145)

## 2016-02-12 LAB — ALBUMIN: Albumin: 2.8 g/dL — ABNORMAL LOW (ref 3.5–5.0)

## 2016-02-12 LAB — PHOSPHORUS: Phosphorus: 3.2 mg/dL (ref 2.5–4.6)

## 2016-02-12 LAB — POTASSIUM: POTASSIUM: 3.4 mmol/L — AB (ref 3.5–5.1)

## 2016-02-12 LAB — PREPARE RBC (CROSSMATCH)

## 2016-02-12 MED ORDER — SODIUM CHLORIDE 0.9 % IV SOLN
2.0000 g | Freq: Once | INTRAVENOUS | Status: AC
  Administered 2016-02-12: 2 g via INTRAVENOUS
  Filled 2016-02-12: qty 20

## 2016-02-12 MED ORDER — MAGNESIUM SULFATE 2 GM/50ML IV SOLN
2.0000 g | Freq: Once | INTRAVENOUS | Status: AC
  Administered 2016-02-12: 19:00:00 2 g via INTRAVENOUS
  Filled 2016-02-12: qty 50

## 2016-02-12 MED ORDER — METOCLOPRAMIDE HCL 5 MG/5ML PO SOLN
10.0000 mg | Freq: Three times a day (TID) | ORAL | Status: DC
  Filled 2016-02-12: qty 10

## 2016-02-12 MED ORDER — SODIUM CHLORIDE 0.9 % IV SOLN
Freq: Once | INTRAVENOUS | Status: AC
  Administered 2016-02-12: 10:00:00 via INTRAVENOUS

## 2016-02-12 MED ORDER — SODIUM CHLORIDE 0.9 % IV SOLN
30.0000 meq | Freq: Once | INTRAVENOUS | Status: DC
  Filled 2016-02-12: qty 15

## 2016-02-12 MED ORDER — SODIUM CHLORIDE 0.9 % IV SOLN
30.0000 meq | Freq: Once | INTRAVENOUS | Status: AC
  Administered 2016-02-12: 06:00:00 30 meq via INTRAVENOUS
  Filled 2016-02-12: qty 15

## 2016-02-12 MED ORDER — POTASSIUM CHLORIDE 20 MEQ PO PACK
20.0000 meq | PACK | Freq: Once | ORAL | Status: DC
  Filled 2016-02-12: qty 1

## 2016-02-12 MED ORDER — METOCLOPRAMIDE HCL 5 MG/5ML PO SOLN
10.0000 mg | Freq: Three times a day (TID) | ORAL | Status: DC
  Administered 2016-02-12 – 2016-02-13 (×6): 10 mg via ORAL
  Filled 2016-02-12 (×9): qty 10

## 2016-02-12 NOTE — Progress Notes (Signed)
[place]  [phone]) [phone] [phone]  ID: ZONNIQUE NORKUS OB: January 17, 1941  MR#: [id number]  RKY#:[id number]  Patient Care Team: Jerrol Banana., MD as PCP - General (Unknown Physician Specialty) [name], MD as Consulting Physician (Cardiology) Algernon Huxley, MD as Consulting Physician (Vascular Surgery)  CHIEF COMPLAINT: Stage III small cell lung carcinoma of the right upper lobe, intractable nausea and vomiting, diarrhea, acute renal failure.  INTERVAL HISTORY: Patient improving and sitting at bedside. Still significantly pancytopenic.   REVIEW OF SYSTEMS:   Review of Systems  Constitutional: Positive for malaise/fatigue. Negative for fever and weight loss.  Respiratory: Negative for cough.   Cardiovascular: Negative.  Negative for chest pain and leg swelling.  Gastrointestinal: Positive for nausea and vomiting.  Genitourinary: Negative.   Musculoskeletal: Negative.   Neurological: Positive for weakness.  Psychiatric/Behavioral: Negative.  The patient is not nervous/anxious.     As per HPI. Otherwise, a complete review of systems is negative.  PAST MEDICAL HISTORY: Past Medical History:  Diagnosis Date  . Abnormal CT lung screening 10/17/2015  . COPD (chronic obstructive pulmonary disease) ([place])   . Coronary artery disease, non-occlusive    a. cath 2006: min nonobs CAD; b. cath 12/2010: cath LAD 50%, RCA 60%; c. 08/2013: Minimal luminal irregs, right dominant system with no significant CAD, diffuse luminal irregs noted. Normal EF 55%, no AS or MS.   . Diabetes mellitus   . Hyperlipemia    Followed by Dr. Rosanna Randy  . Hypertension   . Lung cancer ([place])   . Macular degeneration    rt  . Personal history of tobacco use, presenting hazards to health 10/15/2015  . Pneumonia    hx  . Shortness of breath   . Stroke [facility])     PAST SURGICAL HISTORY: Past Surgical History:  Procedure Laterality Date  . ABDOMINAL HYSTERECTOMY    .  ANTERIOR CERVICAL DECOMP/DISCECTOMY FUSION N/A 10/19/2013   Procedure: CERVICAL FIVE-SIX ANTERIOR CERVICAL DECOMPRESSION WITH FUSION INTERBODY PROSTHESIS PLATING AND PEEK CAGE;  Surgeon: Ophelia Charter, MD;  Location: [facility] NEURO ORS;  Service: Neurosurgery;  Laterality: N/A;  . BACK SURGERY  80's  . BREAST CYST EXCISION Left    left negative   . CARDIAC CATHETERIZATION  05/2004  . CATARACT EXTRACTION Left   . CHOLECYSTECTOMY    . ENDARTERECTOMY Left 10/24/2014   Procedure: ENDARTERECTOMY CAROTID;  Surgeon: Algernon Huxley, MD;  Location: [facility] ORS;  Service: Vascular;  Laterality: Left;  . ENDOBRONCHIAL ULTRASOUND N/A 11/14/2015   Procedure: ENDOBRONCHIAL ULTRASOUND;  Surgeon: [name], MD;  Location: [facility] ORS;  Service: Cardiopulmonary;  Laterality: N/A;  . PERIPHERAL VASCULAR CATHETERIZATION N/A 12/04/2015   Procedure: Glori Luis Cath Insertion;  Surgeon: Algernon Huxley, MD;  Location: [facility] CV LAB;  Service: Cardiovascular;  Laterality: N/A;  . TONSILLECTOMY AND ADENOIDECTOMY    . VESICOVAGINAL FISTULA CLOSURE W/ TAH      FAMILY HISTORY: Family History  Problem Relation Age of Onset  . Stroke Mother     Massive  . Heart attack Father     Massive  . Heart attack Brother   . Heart attack Brother   . Lung cancer Maternal Grandfather   . Heart attack Paternal Grandmother     MI    ADVANCED DIRECTIVES (Y/N):  '@ADVDIR'$ @  HEALTH MAINTENANCE: Social History  Substance Use Topics  . Smoking status: Former Smoker    Packs/day: 1.00    Years: 50.00    Types:  Cigarettes    Quit date: 10/03/2015  . Smokeless tobacco: Never Used     Comment: smokes 3 cigs daily 05/06/15. Pt instructed to quit.  . Alcohol use No     Colonoscopy:  PAP:  Bone density:  Lipid panel:  Allergies  Allergen Reactions  . Coconut Fatty Acids Swelling    Throat swells    Current Facility-Administered Medications  Medication Dose Route Frequency Provider Last Rate Last Dose  . acetaminophen (TYLENOL)  tablet 650 mg  650 mg Oral Q6H PRN Henreitta Leber, MD   650 mg at 02/10/16 0431   Or  . acetaminophen (TYLENOL) suppository 650 mg  650 mg Rectal Q6H PRN Henreitta Leber, MD      . aspirin EC tablet 81 mg  81 mg Oral BH-q7a Henreitta Leber, MD   81 mg at 02/12/16 0558  . atorvastatin (LIPITOR) tablet 20 mg  20 mg Oral QHS Henreitta Leber, MD   20 mg at 02/12/16 2141  . calcium carbonate (TUMS - dosed in mg elemental calcium) chewable tablet 200 mg of elemental calcium  1 tablet Oral TID WC Harrie Foreman, MD   200 mg of elemental calcium at 02/12/16 1715  . diazepam (VALIUM) tablet 5 mg  5 mg Oral Q6H PRN Henreitta Leber, MD   5 mg at 02/12/16 2141  . feeding supplement (ENSURE ENLIVE) (ENSURE ENLIVE) liquid 237 mL  237 mL Oral BID BM Gladstone Lighter, MD   237 mL at 02/12/16 1608  . gabapentin (NEURONTIN) tablet 300 mg  300 mg Oral BID Gladstone Lighter, MD   300 mg at 02/12/16 2141  . insulin aspart (novoLOG) injection 0-15 Units  0-15 Units Subcutaneous TID WC Gladstone Lighter, MD   2 Units at 02/12/16 1716  . insulin aspart (novoLOG) injection 0-5 Units  0-5 Units Subcutaneous QHS Henreitta Leber, MD   3 Units at 02/11/16 2152  . insulin glargine (LANTUS) injection 12 Units  12 Units Subcutaneous QHS Gladstone Lighter, MD   12 Units at 02/12/16 2141  . ipratropium-albuterol (DUONEB) 0.5-2.5 (3) MG/3ML nebulizer solution 3 mL  3 mL Nebulization Q6H PRN Henreitta Leber, MD      . isosorbide mononitrate (IMDUR) 24 hr tablet 30 mg  30 mg Oral Daily Henreitta Leber, MD   30 mg at 02/12/16 0815  . metoCLOPramide (REGLAN) 5 MG/5ML solution 10 mg  10 mg Oral TID AC & HS Gladstone Lighter, MD   10 mg at 02/12/16 2141  . mometasone-formoterol (DULERA) 200-5 MCG/ACT inhaler 2 puff  2 puff Inhalation BID Henreitta Leber, MD   2 puff at 02/12/16 2142  . nitroGLYCERIN (NITROSTAT) SL tablet 0.4 mg  0.4 mg Sublingual Q5 min PRN Henreitta Leber, MD      . ondansetron [facility]) injection 4 mg  4 mg  Intravenous Q6H PRN Gladstone Lighter, MD      . pantoprazole (PROTONIX) EC tablet 40 mg  40 mg Oral Daily Henreitta Leber, MD   40 mg at 02/12/16 0814  . potassium chloride (KLOR-CON) packet 20 mEq  20 mEq Oral Once Lenis Noon, [facility]      . promethazine (PHENERGAN) injection 12.5 mg  12.5 mg Intravenous Q6H PRN Gladstone Lighter, MD      . tiotropium (SPIRIVA) inhalation capsule 18 mcg  18 mcg Inhalation Daily Gladstone Lighter, MD   18 mcg at 02/12/16 1039   Facility-Administered Medications Ordered in Other Encounters  Medication Dose  Route Frequency Provider Last Rate Last Dose  . ondansetron (ZOFRAN) 8 mg, dexamethasone (DECADRON) 10 mg in sodium chloride 0.9 % 50 mL IVPB   Intravenous Once Lloyd Huger, MD        OBJECTIVE: Vitals:   02/12/16 1900 02/12/16 2014  BP: (!) 150/53 (!) 162/60  Pulse: 86 82  Resp: 20 19  Temp: 99.8 F (37.7 C) 97.9 F (36.6 C)     Body mass index is 26.42 kg/m.    ECOG FS:2 - Symptomatic, <50% confined to bed  General: No acute distress. Eyes: Pink conjunctiva, anicteric sclera. HEENT: Normocephalic, moist mucous membranes, clear oropharnyx. Lungs: Clear to auscultation bilaterally. Heart: Regular rate and rhythm. No rubs, murmurs, or gallops. Abdomen: Soft, nontender, nondistended. No organomegaly noted, normoactive bowel sounds. Musculoskeletal: No edema, cyanosis, or clubbing. Neuro: Alert, answering all questions appropriately. Cranial nerves grossly intact. Skin: No rashes or petechiae noted. Psych: Normal affect.  LAB RESULTS:  Lab Results  Component Value Date   NA 136 02/12/2016   K 3.4 (L) 02/12/2016   CL 95 (L) 02/12/2016   CO2 30 02/12/2016   GLUCOSE 159 (H) 02/12/2016   BUN 32 (H) 02/12/2016   CREATININE 1.55 (H) 02/12/2016   CALCIUM 6.5 (L) 02/12/2016   PROT 6.3 (L) 02/05/2016   ALBUMIN 2.8 (L) 02/12/2016   AST 19 02/05/2016   ALT 15 02/05/2016   ALKPHOS 72 02/05/2016   BILITOT 0.7 02/05/2016   GFRNONAA 32 (L)  02/12/2016   GFRAA 37 (L) 02/12/2016    Lab Results  Component Value Date   WBC 0.3 (LL) 02/12/2016   NEUTROABS 0.1 (L) 02/12/2016   HGB 7.7 (L) 02/12/2016   HCT 21.6 (L) 02/12/2016   MCV 90.2 02/12/2016   PLT 15 (LL) 02/12/2016     STUDIES: Dg Chest 2 View  Result Date: 02/10/2016 CLINICAL DATA:  Fever cough and vomiting for 1 day EXAM: CHEST  2 VIEW COMPARISON:  01/21/2016 FINDINGS: Left-sided central venous port tip overlies the SVC. Surgical hardware in the lower cervical spine. Prominent interstitial opacities within the lingula and lung bases are unchanged. No interval consolidation. Development of tiny pleural effusions. Stable cardiomediastinal silhouette. No pneumothorax. IMPRESSION: 1. Development of tiny bilateral effusions 2. No interval consolidation. Electronically Signed   By: Donavan Foil M.D.   On: 02/10/2016 15:03   Dg Chest 2 View  Result Date: 01/21/2016 CLINICAL DATA:  Chronic cough and shortness of breath. Stage III small cell right upper lobe lung cancer. EXAM: CHEST  2 VIEW COMPARISON:  PET-CT 10/23/2015. Chest CT 10/15/2015. Chest radiographs 01/27/2015. FINDINGS: Left jugular Port-A-Cath terminates over the lower SVC. The cardiomediastinal silhouette is within normal limits. Right upper lobe nodule on PET-CT is not well seen on these radiographs. No airspace consolidation, edema, pleural effusion, or pneumothorax is identified. Right upper quadrant abdominal surgical clips and prior ACDF are noted. No acute osseous abnormality is seen. IMPRESSION: No evidence of acute cardiopulmonary process. Electronically Signed   By: Logan Bores M.D.   On: 01/21/2016 12:14   US Renal  Result Date: 02/05/2016 CLINICAL DATA:  Acute renal failure EXAM: RENAL / URINARY TRACT ULTRASOUND COMPLETE COMPARISON:  PET-CT October 23, 2015 FINDINGS: Right Kidney: Length: 10.6 cm. Echogenicity and renal cortical thickness are within normal limits. No mass, perinephric fluid, or hydronephrosis  visualized. There is no sonographically demonstrable calculus or ureterectasis. Left Kidney: Length: 10.1 cm. Echogenicity and renal cortical thickness are within normal limits. No mass, perinephric fluid, or hydronephrosis visualized.  There is no sonographically demonstrable calculus or ureterectasis. Bladder: Appears normal for degree of bladder distention. IMPRESSION: Study within normal limits. Electronically Signed   By: Lowella Grip III M.D.   On: 02/05/2016 16:00    ASSESSMENT: Stage III small cell lung carcinoma of the right upper lobe, intractable nausea and vomiting, diarrhea, acute renal failure.  PLAN:   1. Intractable nausea and vomiting: Improved since admission. Patient with poor appetite, but tolerating PO better.o.  2. Acute renal failure: Significantly improved with IV fluid resuscitation and nearly back to her baseline. Most likely related to hypovolemia and dehydration.  3. Clinical stage IIIa small cell lung carcinoma of the right upper lobe lung: CT and PET scan results reviewed independently concerning for stage IIIa disease given patient's 11 mm right paratracheal lymph node positive for small cell lung cancer. Patient has no other evidence of metastatic disease. MRI the brain is negative. Patient received cycle 3 of cisplatin and etoposide on October 24-26. She has now completed XRT. Given her extensive hospitalization will likely not give cycle 4. 4. Anemia: Hbg still decreased requiring transfusions. Maintain hbg >7.0 5. hrombocytopenia: Secondary to chemotherapy.  Unusual to be this decreased two weeks after treatment.  Transfusion today.  Monitor. 6. Neutropenia: Again, usual not to have recovered two weeks after treament. If no improvement, will consider restarting neupogen.   Will follow.

## 2016-02-12 NOTE — Progress Notes (Signed)
[name] NAME: [name] [name]    MR#:  [id number]  DATE OF BIRTH:  09-18-1940  SUBJECTIVE:  CHIEF COMPLAINT:  No chief complaint on file.  -Nausea is improving. Complaints of fatigue and weakness. -Counts continue to drop  REVIEW OF SYSTEMS:  Review of Systems  Constitutional: Positive for malaise/fatigue. Negative for chills and fever.  HENT: Negative for ear discharge, ear pain, nosebleeds and tinnitus.   Respiratory: Negative for cough, shortness of breath and wheezing.   Cardiovascular: Negative for chest pain and palpitations.  Gastrointestinal: Positive for nausea. Negative for abdominal pain, constipation, diarrhea and vomiting.  Genitourinary: Negative for dysuria and urgency.  Musculoskeletal: Negative for myalgias.  Neurological: Negative for dizziness, sensory change, speech change, focal weakness, seizures and headaches.  Psychiatric/Behavioral: Negative for depression.    DRUG ALLERGIES:   Allergies  Allergen Reactions  . Coconut Fatty Acids Swelling    Throat swells    VITALS:  Blood pressure (!) 145/47, pulse 89, temperature 99.7 F (37.6 C), temperature source Oral, resp. rate 18, height '5\' 4"'$  (1.626 m), weight 69.8 kg (153 lb 14.4 oz), SpO2 (!) 88 %.  PHYSICAL EXAMINATION:  Physical Exam  GENERAL:  75 y.o.-year-old patient lying in the bed with no acute distress.  EYES: Pupils equal, round, reactive to light and accommodation. No scleral icterus. Extraocular muscles intact. Conjunctival pallor HEENT: Head atraumatic, normocephalic. Oropharynx and nasopharynx clear.  NECK:  Supple, no jugular venous distention. No thyroid enlargement, no tenderness.  LUNGS: Normal breath sounds bilaterally, no wheezing, rales,rhonchi or crepitation. No use of accessory muscles of respiration. Diminished breath sounds at the bases. Rhonchi on right side. CARDIOVASCULAR: S1, S2 normal. No  rubs, or gallops. 2/6 systolic  murmur is present ABDOMEN: Soft, nontender, nondistended. Bowel sounds present. No organomegaly or mass.  EXTREMITIES: No pedal edema, cyanosis, or clubbing.  NEUROLOGIC: Cranial nerves II through XII are intact. Muscle strength 5/5 in all extremities. Sensation intact. Gait not checked.  PSYCHIATRIC: The patient is alert and oriented x 3.  SKIN: No obvious rash, lesion, or ulcer. Tiny petechia noted on both hands on the dorsal surfaces   LABORATORY PANEL:   CBC  Recent Labs Lab 02/12/16 0430  WBC 0.3*  HGB 7.7*  HCT 21.6*  PLT 15*   ------------------------------------------------------------------------------------------------------------------  Chemistries   Recent Labs Lab 02/05/16 1102  02/12/16 0430  NA 132*  < > 136  K 4.5  < > 3.2*  CL 91*  < > 95*  CO2 27  < > 30  GLUCOSE 282*  < > 159*  BUN 83*  < > 32*  CREATININE 3.27*  < > 1.55*  CALCIUM 8.6*  < > 6.5*  MG 1.9  < > 1.7  AST 19  --   --   ALT 15  --   --   ALKPHOS 72  --   --   BILITOT 0.7  --   --   < > = values in this interval not displayed. ------------------------------------------------------------------------------------------------------------------  Cardiac Enzymes No results for input(s): TROPONINI in the last 168 hours. ------------------------------------------------------------------------------------------------------------------  RADIOLOGY:  Dg Chest 2 View  Result Date: 02/10/2016 CLINICAL DATA:  Fever cough and vomiting for 1 day EXAM: CHEST  2 VIEW COMPARISON:  01/21/2016 FINDINGS: Left-sided central venous port tip overlies the SVC. Surgical hardware in the lower cervical spine. Prominent interstitial opacities within the lingula and lung bases are unchanged. No interval consolidation. Development of tiny pleural  effusions. Stable cardiomediastinal silhouette. No pneumothorax. IMPRESSION: 1. Development of tiny bilateral effusions 2. No interval consolidation. Electronically Signed    By: Donavan Foil M.D.   On: 02/10/2016 15:03    EKG:   Orders placed or performed in visit on 10/29/15  . EKG 12-Lead    ASSESSMENT AND PLAN:   75 year old female with past medical history of CVA, COPD, lung cancer, diabetes, coronary artery disease, macular degeneration who presents to the hospital due to intractable nausea vomiting and noted to be in acute renal failure.  1. Acute kidney injury-secondary to persistent nausea vomiting and dehydration.  -Prerenal causes and ATN from chemotherapy - baseline cr of 1, now improved to 1.5 since admission - appreciate nephrology consult, stopped IV fluids, avoid nephrotoxins  2. Intractable N/V - due to chemo. Advance diet as tolerated -  Added Reglan prior to eating. Tolerating well. - supportive care w/ IV fluids, anti-emetics and will monitor.   3. Acute on chronic anemia-secondary to chemotherapy. -Received a total of 2 units of packed RBC transfusion since admission, plan to keep hemoglobin greater than 8. -Counts dropped again today. Hemoglobin at 7.7. One more unit transfused ordered for transfusion.  4. 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 Finished radiation, on cycle 3 of chemotherapy. -Follow up with oncology as outpatient after discharge. - cont inhalers and prn nebs for her wheezing -Needing 2 L O2 now. On home O2 at bedtime  5. Pancytopenia-with Leukopenia/thrombocytopenia-secondary to the chemotherapy. Discussed with patient's oncologist. No further recommendations. -4 days course of Neupogen finished, counselors still low. Oncology consulted to see if she needs Neupogen again. -Received plt Tx - 2 units since admission. Platelets dropped down to 15 K again. 2 more units ordered for transfusion. -Low-grade temperature less than 100.5 still. Cultures negative, UA and chest x-ray with no infection so far. Hold off on antibiotics and monitor.  6. Hyperglycemia- new onset diabetes, a1c is  9.8 - sugars are elevated as patient drinking mostly sweetened stuff due to her nausea. Started lantus, change SSI to moderate scale.  7. DVT prophylaxis-Ted's and SCDs. Hold off on heparin products due to anemia and thrombocytopenia  8. Severe hypomagnesemia and hypokalemia-being replaced. Continue to monitor   Physical therapy worked with the patient and recommended rehabilitation. Discharge once her blood counts have improved   All the records are reviewed and case discussed with Care Management/Social Workerr. Management plans discussed with the patient, family and they are in agreement.  CODE STATUS: Full code  TOTAL TIME TAKING CARE OF THIS PATIENT: 37 minutes.   POSSIBLE D/C IN 1-2 DAYS, DEPENDING ON CLINICAL CONDITION and as her blood counts improve.   Gladstone Lighter M.D on 02/12/2016 at 8:43 AM  Between 7am to 6pm - Pager - [phone]  After 6pm go to [email or web address] - password [facility]  Office  [phone]  CC: Primary care physician; [name], MD

## 2016-02-12 NOTE — Progress Notes (Signed)
[facility]  ROUNDING NOTE   Subjective:  Patient receiving multiple blood products today. Creatinine currently 1.5 which is still above her baseline. Platelets were down to 15,000.  Objective:  Vital signs in last 24 hours:  Temp:  [98.2 F (36.8 C)-99.7 F (37.6 C)] 98.2 F (36.8 C) (11/08 1044) Pulse Rate:  [80-100] 80 (11/08 1044) Resp:  [16-18] 18 (11/08 1044) BP: (108-145)/(47-59) 132/58 (11/08 1044) SpO2:  [88 %-96 %] 96 % (11/08 1044)  Weight change:  Filed Weights   02/05/16 1511  Weight: 69.8 kg (153 lb 14.4 oz)    Intake/Output: I/O last 3 completed shifts: In: 1265 [P.O.:900; IV Piggyback:365] Out: 2000 [Urine:2000]   Intake/Output this shift:  Total I/O In: 240 [P.O.:240] Out: -   Physical Exam: General: NAD  Head: Normocephalic, atraumatic. Moist oral mucosal membranes  Eyes: Anicteric  Neck: Supple, trachea midline  Lungs:  Clear to auscultation, Normal effort   Heart: Regular rate and rhythm  Abdomen:  Soft, nontender, Bowel sounds present   Extremities: no peripheral edema.  Neurologic: Nonfocal, moving all four extremities  Skin: No lesions       Basic Metabolic Panel:  Recent Labs Lab 02/08/16 0524 02/09/16 0527 02/10/16 0514 02/11/16 0345 02/11/16 0347 02/12/16 0430  NA 136 137 137 138  --  136  K 3.1* 3.4* 3.0* 3.5  --  3.2*  CL 102 102 98* 98*  --  95*  CO2 '26 28 28 30  '$ --  30  GLUCOSE 181* 166* 177* 183*  --  159*  BUN 41* 28* 25* 27*  --  32*  CREATININE 2.11* 1.60* 1.64* 1.48*  --  1.55*  CALCIUM 6.8* 6.6* 6.3* 6.5*  --  6.5*  MG  --   --  0.7* 1.0*  --  1.7  PHOS  --   --   --   --  2.4* 3.2    Liver Function Tests:  Recent Labs Lab 02/12/16 0430  ALBUMIN 2.8*   No results for input(s): LIPASE, AMYLASE in the last 168 hours. No results for input(s): AMMONIA in the last 168 hours.  CBC:  Recent Labs Lab 02/08/16 0524 02/09/16 0527 02/10/16 0514 02/11/16 0345 02/12/16 0430  WBC 1.0* 0.6*  0.3* 0.2* 0.3*  NEUTROABS  --   --   --  0.0* 0.1*  HGB 8.7* 8.5* 8.0* 8.2* 7.7*  HCT 24.5* 24.1* 22.8* 23.7* 21.6*  MCV 91.8 92.1 90.9 92.3 90.2  PLT 12* 80* 42* 25* 15*    Cardiac Enzymes: No results for input(s): CKTOTAL, CKMB, CKMBINDEX, TROPONINI in the last 168 hours.  BNP: Invalid input(s): POCBNP  CBG:  Recent Labs Lab 02/11/16 1140 02/11/16 1143 02/11/16 1636 02/11/16 2148 02/12/16 0733  GLUCAP 417* 408* 245* 258* 171*    Microbiology: Results for orders placed or performed during the hospital encounter of 02/05/16  CULTURE, BLOOD (ROUTINE X 2) w Reflex to ID Panel     Status: None (Preliminary result)   Collection Time: 02/10/16  1:56 PM  Result Value Ref Range Status   Specimen Description BLOOD R ARM  Final   Special Requests   Final    BOTTLES DRAWN AEROBIC AND ANAEROBIC AER 3ML ANA 1ML   Culture NO GROWTH 2 DAYS  Final   Report Status PENDING  Incomplete  CULTURE, BLOOD (ROUTINE X 2) w Reflex to ID Panel     Status: None (Preliminary result)   Collection Time: 02/10/16  2:11 PM  Result Value Ref  Range Status   Specimen Description BLOOD L HAND  Final   Special Requests   Final    BOTTLES DRAWN AEROBIC AND ANAEROBIC AER 3ML ANA 2ML   Culture NO GROWTH 2 DAYS  Final   Report Status PENDING  Incomplete    Coagulation Studies: No results for input(s): LABPROT, INR in the last 72 hours.  Urinalysis:  Recent Labs  02/10/16 2055  COLORURINE STRAW*  LABSPEC 1.010  PHURINE 7.0  GLUCOSEU >500*  HGBUR 1+*  BILIRUBINUR NEGATIVE  KETONESUR NEGATIVE  PROTEINUR 100*  NITRITE NEGATIVE  LEUKOCYTESUR NEGATIVE      Imaging: Dg Chest 2 View  Result Date: 02/10/2016 CLINICAL DATA:  Fever cough and vomiting for 1 day EXAM: CHEST  2 VIEW COMPARISON:  01/21/2016 FINDINGS: Left-sided central venous port tip overlies the SVC. Surgical hardware in the lower cervical spine. Prominent interstitial opacities within the lingula and lung bases are unchanged. No  interval consolidation. Development of tiny pleural effusions. Stable cardiomediastinal silhouette. No pneumothorax. IMPRESSION: 1. Development of tiny bilateral effusions 2. No interval consolidation. Electronically Signed   By: Donavan Foil M.D.   On: 02/10/2016 15:03     Medications:    . aspirin EC  81 mg Oral [id number]  . atorvastatin  20 mg Oral QHS  . calcium carbonate  1 tablet Oral TID WC  . feeding supplement (ENSURE ENLIVE)  237 mL Oral BID BM  . gabapentin  300 mg Oral BID  . insulin aspart  0-15 Units Subcutaneous TID WC  . insulin aspart  0-5 Units Subcutaneous QHS  . insulin glargine  12 Units Subcutaneous QHS  . isosorbide mononitrate  30 mg Oral Daily  . magnesium sulfate 1 - 4 g bolus IVPB  2 g Intravenous Once  . metoCLOPramide  10 mg Oral TID AC & HS  . mometasone-formoterol  2 puff Inhalation BID  . pantoprazole  40 mg Oral Daily  . tiotropium  18 mcg Inhalation Daily   acetaminophen **OR** acetaminophen, diazepam, ipratropium-albuterol, nitroGLYCERIN, ondansetron (ZOFRAN) IV, promethazine  Assessment/ Plan:  [name] is a 75 y.o. white female with Diabetes, COPD, hypertension, lung cancer treated with radiation and chemotherapy, macular degeneration, history of stroke, was admitted on 02/05/2016 with dehydration, nausea, vomiting postchemotherapy.   1. Acute renal failure on chronic kidney disease stage III: baseline creatinine is 1.02 from 01/21/2016/GFR 53 Acute kidney injury is likely secondary to volume depletion and possibly ATN post-chemotherapy from nausea and vomiting -  Renal function relatively stable over the past several days. BUN currently 32 with a creatinine of 1.5. Continue to monitor renal function parameters.  2. Hypertension: Blood pressure today is 132/58. - holding losartan and hydrochlorothiazide due to acute renal failure.   3. Diabetes mellitus type II with chronic kidney disease: Blood glucose was 159 this a.m. Metformin on hold  given acute renal failure.  4. Lung cancer: status post chemotherapy.  - Followed by Dr. [name].  She is receiving platelets, and PRBC transfusion today.  5.  Hypokalemia. Potassium down to 3.2.  We will administer potassium chloride 30 meq IV x one.    LOS: 7 [name] [name] 11/8/201710:53 AM

## 2016-02-12 NOTE — Progress Notes (Signed)
Dr. Holley Raring put in order for IV potassium, pt already received this exact dose this morning.  Made Dr. Holley Raring aware and he gave verbal order to d/c order.  Potassium will be rechecked at 1800.  Clarise Cruz, RN

## 2016-02-12 NOTE — Progress Notes (Signed)
Physical Therapy Treatment Patient Details Name: [name] [name] MRN: [id number] DOB: [birth year] Today's Date: 02/12/2016    History of Present Illness Meyer Godeaux  is a 75 y.o. female with a known history of Diabetes, COPD, hypertension, hyperlipidemia, lung cancer currently getting chemotherapy radiation, macular degeneration, history of previous CVA who presented to the hospital due to ongoing nausea vomiting and diarrhea and noted to be in acute renal failure. Patient is currently getting chemoradiation for her lung cancer and had her last chemotherapy treatment about a week ago. She says since this past Friday she is planning having persistent nausea vomiting and has not been able to keep anything down. She went to see her oncologist today at the [facility] was noted to be in acute kidney injury and noted to be acutely dehydrated. She was sent to the hospital for admission. She admits to intermittent abdominal pain associated with her nausea vomiting, but no documented fever, chills. She denies any chest pain, worsening shortness of breath, weight loss, hemoptysis or any other associated symptoms presently. Denies falls in the last 12 months    PT Comments    Pt agreeable to PT; no voiced complaints. Pt will be receiving blood transfusion soon due to severe low platelets and potassium; deferred out of bed for these reasons. Pt participates very well with supine exercises. Pt performs well and does not require assist or rest periods between exercises. Continue PT to progress out of bed activities/ambulation as able to improve all functional mobility.   Follow Up Recommendations  SNF     Equipment Recommendations  None recommended by PT;Other (comment)    Recommendations for Other Services       Precautions / Restrictions Precautions Precautions: Fall Restrictions Weight Bearing Restrictions: No    Mobility  Bed Mobility               General bed mobility comments: Defered;  pt awaiting blood transfusion for severly low platelets and potassium. Nurse in during session to begin  Transfers                    Ambulation/Gait                 Stairs            Wheelchair Mobility    Modified Rankin (Stroke Patients Only)       Balance                                    Cognition Arousal/Alertness: Awake/alert Behavior During Therapy: WFL for tasks assessed/performed Overall Cognitive Status: Within Functional Limits for tasks assessed                      Exercises General Exercises - Lower Extremity Ankle Circles/Pumps: AROM;Both;20 reps;Supine Quad Sets: Strengthening;Both;20 reps;Supine Gluteal Sets: Strengthening;Both;20 reps;Supine Short Arc Quad: AROM;Both;20 reps;Supine Heel Slides: AROM;Both;20 reps;Supine Hip ABduction/ADduction: AROM;Both;20 reps;Supine Straight Leg Raises: AROM;Both;20 reps;Supine Hip Flexion/Marching: AROM;Both;20 reps;Supine;Other (comment) (with abdominal brace) Other Exercises Other Exercises: adductor squeeze 20x supine    General Comments        Pertinent Vitals/Pain Pain Assessment: No/denies pain    Home Living                      Prior Function            PT Goals (current goals can  now be found in the care plan section) Progress towards PT goals: Progressing toward goals    Frequency    Min 2X/week      PT Plan Current plan remains appropriate    Co-evaluation             End of Session Equipment Utilized During Treatment: Oxygen Activity Tolerance: Other (comment) (medical issues/lab values; need for transfusion) Patient left: in bed;with call bell/phone within reach;with nursing/sitter in room;with bed alarm set     Time: 1011-1031 PT Time Calculation (min) (ACUTE ONLY): 20 min  Charges:                       G CodesLarae Grooms, PTA 02/12/2016, 10:37 AM

## 2016-02-12 NOTE — Care Management Important Message (Signed)
Important Message  Patient Details  Name: [name] [name] MRN: [id number] Date of Birth: 11-25-1940   Medicare Important Message Given:  Yes    Shelbie Ammons, RN 02/12/2016, 8:25 AM

## 2016-02-12 NOTE — Progress Notes (Signed)
Inpatient Diabetes Program Recommendations  AACE/ADA: New Consensus Statement on Inpatient Glycemic Control (2015)  Target Ranges:  Prepandial:   less than 140 mg/dL      Peak postprandial:   less than 180 mg/dL (1-2 hours)      Critically ill patients:  140 - 180 mg/dL  Results for RIYA, HUXFORD (MRN [id number]) as of 02/12/2016 12:09  Ref. Range 02/11/2016 07:28 02/11/2016 11:40 02/11/2016 11:43 02/11/2016 16:36 02/11/2016 21:48 02/12/2016 07:33 02/12/2016 11:49  Glucose-Capillary Latest Ref Range: 65 - 99 mg/dL 231 (H) 417 (H) 408 (H) 245 (H) 258 (H) 171 (H) 262 (H)    Review of Glycemic Control  Diabetes history: DM2 Outpatient Diabetes medications: Actos 30 mg daily, Amaryl 4 mg BID, Metformin 1000 mg daily Current orders for Inpatient glycemic control: Lantus 12 units QHS, Novolog 0-15 units TID with meals, Novolog 0-5 units QHS  Inpatient Diabetes Program Recommendations Insulin - Meal Coverage: If patient is eating at least 50% of meals, please consider ordering Novolog 3 units TID with meals for meal coverage.  Thanks, Barnie Alderman, RN, MSN, CDE Diabetes Coordinator Inpatient Diabetes Program [phone] (Team Pager from 8am to 5pm)

## 2016-02-12 NOTE — Progress Notes (Signed)
Pharmacy Consult for electrolyte replacement  Allergies  Allergen Reactions  . Coconut Fatty Acids Swelling    Throat swells   Labs:  Recent Labs  02/10/16 0514 02/11/16 0345 02/12/16 0430  WBC 0.3* 0.2* 0.3*  HGB 8.0* 8.2* 7.7*  HCT 22.8* 23.7* 21.6*  PLT 42* 25* 15*     Recent Labs  02/10/16 0514 02/11/16 0345 02/11/16 0347 02/12/16 0430 02/12/16 1942  NA 137 138  --  136  --   K 3.0* 3.5  --  3.2* 3.4*  CL 98* 98*  --  95*  --   CO2 28 30  --  30  --   GLUCOSE 177* 183*  --  159*  --   BUN 25* 27*  --  32*  --   CREATININE 1.64* 1.48*  --  1.55*  --   CALCIUM 6.3* 6.5*  --  6.5*  --   MG 0.7* 1.0*  --  1.7  --   PHOS  --   --  2.4* 3.2  --   ALBUMIN  --   --   --  2.8*  --    Estimated Creatinine Clearance: 31 mL/min (by C-G formula based on SCr of 1.55 mg/dL (H)).   Assessment: Pharmacy consulted to replace electrolytes in this 75 year old female admitted with intractable nausea and vomiting due to chemotherapy  Plan:  K = 3.4 this evening - will give KCl 20 mEq packet PO x 1 dose tonight and check electrolytes with AM labs tomorrow.  Lenis Noon, PharmD Clinical Pharmacist  02/12/2016,8:23 PM

## 2016-02-12 NOTE — Progress Notes (Addendum)
Pharmacy Consult for electrolyte replacement  Allergies  Allergen Reactions  . Coconut Fatty Acids Swelling    Throat swells   Labs:  Recent Labs  02/10/16 0514 02/11/16 0345 02/12/16 0430  WBC 0.3* 0.2* 0.3*  HGB 8.0* 8.2* 7.7*  HCT 22.8* 23.7* 21.6*  PLT 42* 25* 15*     Recent Labs  02/10/16 0514 02/11/16 0345 02/11/16 0347 02/12/16 0430  NA 137 138  --  136  K 3.0* 3.5  --  3.2*  CL 98* 98*  --  95*  CO2 28 30  --  30  GLUCOSE 177* 183*  --  159*  BUN 25* 27*  --  32*  CREATININE 1.64* 1.48*  --  1.55*  CALCIUM 6.3* 6.5*  --  6.5*  MG 0.7* 1.0*  --  1.7  PHOS  --   --  2.4* 3.2   Estimated Creatinine Clearance: 31 mL/min (by C-G formula based on SCr of 1.55 mg/dL (H)).   Assessment: Pharmacy consulted to replace electrolytes in this 75 year old female admitted with intractable nausea and vomiting due to chemotherapy  Plan:  Magnesium sulfate 2 g iv and calcium gluconate 2 g iv once and f/u afternoon labs.   Ulice Dash, PharmD Clinical Pharmacist  02/12/2016,5:45 AM

## 2016-02-13 ENCOUNTER — Other Ambulatory Visit: Payer: Self-pay | Admitting: *Deleted

## 2016-02-13 DIAGNOSIS — D649 Anemia, unspecified: Secondary | ICD-10-CM

## 2016-02-13 LAB — TYPE AND SCREEN
ABO/RH(D): O POS
ANTIBODY SCREEN: NEGATIVE
Unit division: 0

## 2016-02-13 LAB — BASIC METABOLIC PANEL
ANION GAP: 10 (ref 5–15)
BUN: 29 mg/dL — ABNORMAL HIGH (ref 6–20)
CALCIUM: 7.4 mg/dL — AB (ref 8.9–10.3)
CO2: 31 mmol/L (ref 22–32)
CREATININE: 1.41 mg/dL — AB (ref 0.44–1.00)
Chloride: 96 mmol/L — ABNORMAL LOW (ref 101–111)
GFR, EST AFRICAN AMERICAN: 42 mL/min — AB (ref >60)
GFR, EST NON AFRICAN AMERICAN: 36 mL/min — AB (ref >60)
Glucose, Bld: 174 mg/dL — ABNORMAL HIGH (ref 65–99)
Potassium: 3.2 mmol/L — ABNORMAL LOW (ref 3.5–5.1)
SODIUM: 137 mmol/L (ref 135–145)

## 2016-02-13 LAB — PREPARE PLATELET PHERESIS
UNIT DIVISION: 0
Unit division: 0

## 2016-02-13 LAB — CBC
HEMATOCRIT: 23.8 % — AB (ref 35.0–47.0)
HEMOGLOBIN: 8.5 g/dL — AB (ref 12.0–16.0)
MCH: 31.5 pg (ref 26.0–34.0)
MCHC: 35.6 g/dL (ref 32.0–36.0)
MCV: 88.4 fL (ref 80.0–100.0)
Platelets: 60 10*3/uL — ABNORMAL LOW (ref 150–440)
RBC: 2.69 MIL/uL — AB (ref 3.80–5.20)
RDW: 16 % — ABNORMAL HIGH (ref 11.5–14.5)
WBC: 0.4 10*3/uL — CL (ref 3.6–11.0)

## 2016-02-13 LAB — GLUCOSE, CAPILLARY
Glucose-Capillary: 180 mg/dL — ABNORMAL HIGH (ref 65–99)
Glucose-Capillary: 321 mg/dL — ABNORMAL HIGH (ref 65–99)

## 2016-02-13 LAB — MAGNESIUM: MAGNESIUM: 1.8 mg/dL (ref 1.7–2.4)

## 2016-02-13 MED ORDER — POTASSIUM CHLORIDE CRYS ER 20 MEQ PO TBCR
40.0000 meq | EXTENDED_RELEASE_TABLET | Freq: Once | ORAL | Status: AC
  Administered 2016-02-13: 10:00:00 40 meq via ORAL
  Filled 2016-02-13: qty 2

## 2016-02-13 MED ORDER — ONDANSETRON HCL 8 MG PO TABS: 8.0000 mg | ORAL_TABLET | Freq: Three times a day (TID) | ORAL | 1 refills | Status: DC | PRN

## 2016-02-13 MED ORDER — GLIMEPIRIDE 2 MG PO TABS: 2.0000 mg | ORAL_TABLET | Freq: Two times a day (BID) | ORAL | 2 refills | Status: DC

## 2016-02-13 MED ORDER — METOPROLOL TARTRATE 25 MG PO TABS: 25.0000 mg | ORAL_TABLET | Freq: Two times a day (BID) | ORAL | 2 refills | Status: DC

## 2016-02-13 MED ORDER — METOCLOPRAMIDE HCL 5 MG/5ML PO SOLN: 10.0000 mg | Freq: Three times a day (TID) | ORAL | 0 refills | Status: DC

## 2016-02-13 NOTE — Progress Notes (Signed)
[facility] referral received from [facility]. Skilled nursing and physical therapy services have been requested. Mrs. Dowers was admitted to [facility] on 11/1 for evaluation of intractable nausea/vomiting follow her last cycle of chemo for stage III a lung cancer. She was found to be in acute renal failure. She has improved with IV fluids and blood transfusions. Plan is for discharge home today. She lives at home with her husband and has 2 grown sons that live in [place]. Patient seen sitting up in bed, alert and oriented x 4. [facility] services explained, patient agreeable to services. She reported that she "does not want anymore chemo" as it makes her "feel worse than the cancer". She has no new DME needs at this time. Oxygen is in place in the home, she reports she uses 3 liters at night. She also has a cane, walker and wheelchair in the home. [facility] information and contact number left with Mrs. Sipes. Patient information faxed to [facility] referral. Thank you. Flo Shanks RN, BSN, [facility] Liaison [phone] c

## 2016-02-13 NOTE — Progress Notes (Signed)
[facility]  ROUNDING NOTE   Subjective:  Patient just beta this a.m. She was a bit tired. Platelets up to 60,000. Renal function stable with a creatinine 1.4.  Objective:  Vital signs in last 24 hours:  Temp:  [97.5 F (36.4 C)-99.8 F (37.7 C)] 97.5 F (36.4 C) (11/09 0433) Pulse Rate:  [80-90] 84 (11/09 0433) Resp:  [18-20] 18 (11/09 0433) BP: (128-162)/(50-63) 150/61 (11/09 0433) SpO2:  [95 %-97 %] 97 % (11/09 0433)  Weight change:  Filed Weights   02/05/16 1511  Weight: 69.8 kg (153 lb 14.4 oz)    Intake/Output: I/O last 3 completed shifts: In: 1859.7 [P.O.:900; I.V.:2.7; Blood:692; IV Piggyback:265] Out: 1050 [Urine:1050]   Intake/Output this shift:  Total I/O In: 240 [P.O.:240] Out: 400 [Urine:400]  Physical Exam: General: NAD  Head: Normocephalic, atraumatic. Moist oral mucosal membranes  Eyes: Anicteric  Neck: Supple, trachea midline  Lungs:  Clear to auscultation, Normal effort   Heart: Regular rate and rhythm  Abdomen:  Soft, nontender, Bowel sounds present   Extremities: no peripheral edema.  Neurologic: Nonfocal, moving all four extremities  Skin: No lesions       Basic Metabolic Panel:  Recent Labs Lab 02/09/16 0527 02/10/16 0514 02/11/16 0345 02/11/16 0347 02/12/16 0430 02/12/16 1942 02/13/16 0415  NA 137 137 138  --  136  --  137  K 3.4* 3.0* 3.5  --  3.2* 3.4* 3.2*  CL 102 98* 98*  --  95*  --  96*  CO2 '28 28 30  '$ --  30  --  31  GLUCOSE 166* 177* 183*  --  159*  --  174*  BUN 28* 25* 27*  --  32*  --  29*  CREATININE 1.60* 1.64* 1.48*  --  1.55*  --  1.41*  CALCIUM 6.6* 6.3* 6.5*  --  6.5*  --  7.4*  MG  --  0.7* 1.0*  --  1.7  --  1.8  PHOS  --   --   --  2.4* 3.2  --   --     Liver Function Tests:  Recent Labs Lab 02/12/16 0430  ALBUMIN 2.8*   No results for input(s): LIPASE, AMYLASE in the last 168 hours. No results for input(s): AMMONIA in the last 168 hours.  CBC:  Recent Labs Lab 02/09/16 0527  02/10/16 0514 02/11/16 0345 02/12/16 0430 02/13/16 0415  WBC 0.6* 0.3* 0.2* 0.3* 0.4*  NEUTROABS  --   --  0.0* 0.1*  --   HGB 8.5* 8.0* 8.2* 7.7* 8.5*  HCT 24.1* 22.8* 23.7* 21.6* 23.8*  MCV 92.1 90.9 92.3 90.2 88.4  PLT 80* 42* 25* 15* 60*    Cardiac Enzymes: No results for input(s): CKTOTAL, CKMB, CKMBINDEX, TROPONINI in the last 168 hours.  BNP: Invalid input(s): POCBNP  CBG:  Recent Labs Lab 02/12/16 0733 02/12/16 1149 02/12/16 1637 02/12/16 2116 02/13/16 0720  GLUCAP 171* 262* 193* 192* 180*    Microbiology: Results for orders placed or performed during the hospital encounter of 02/05/16  CULTURE, BLOOD (ROUTINE X 2) w Reflex to ID Panel     Status: None (Preliminary result)   Collection Time: 02/10/16  1:56 PM  Result Value Ref Range Status   Specimen Description BLOOD R ARM  Final   Special Requests   Final    BOTTLES DRAWN AEROBIC AND ANAEROBIC AER 3ML ANA 1ML   Culture NO GROWTH 3 DAYS  Final   Report Status PENDING  Incomplete  CULTURE, BLOOD (ROUTINE X 2) w Reflex to ID Panel     Status: None (Preliminary result)   Collection Time: 02/10/16  2:11 PM  Result Value Ref Range Status   Specimen Description BLOOD L HAND  Final   Special Requests   Final    BOTTLES DRAWN AEROBIC AND ANAEROBIC AER 3ML ANA 2ML   Culture NO GROWTH 3 DAYS  Final   Report Status PENDING  Incomplete    Coagulation Studies: No results for input(s): LABPROT, INR in the last 72 hours.  Urinalysis:  Recent Labs  02/10/16 2055  COLORURINE STRAW*  LABSPEC 1.010  PHURINE 7.0  GLUCOSEU >500*  HGBUR 1+*  BILIRUBINUR NEGATIVE  KETONESUR NEGATIVE  PROTEINUR 100*  NITRITE NEGATIVE  LEUKOCYTESUR NEGATIVE      Imaging: No results found.   Medications:    . aspirin EC  81 mg Oral BH-q7a  . atorvastatin  20 mg Oral QHS  . calcium carbonate  1 tablet Oral TID WC  . feeding supplement (ENSURE ENLIVE)  237 mL Oral BID BM  . gabapentin  300 mg Oral BID  . insulin aspart   0-15 Units Subcutaneous TID WC  . insulin aspart  0-5 Units Subcutaneous QHS  . insulin glargine  12 Units Subcutaneous QHS  . isosorbide mononitrate  30 mg Oral Daily  . metoCLOPramide  10 mg Oral TID AC & HS  . mometasone-formoterol  2 puff Inhalation BID  . pantoprazole  40 mg Oral Daily  . potassium chloride  20 mEq Oral Once  . tiotropium  18 mcg Inhalation Daily   acetaminophen **OR** acetaminophen, diazepam, ipratropium-albuterol, nitroGLYCERIN, ondansetron (ZOFRAN) IV, promethazine  Assessment/ Plan:  [name] [name] is a 75 y.o. white female with Diabetes, COPD, hypertension, lung cancer treated with radiation and chemotherapy, macular degeneration, history of stroke, was admitted on 02/05/2016 with dehydration, nausea, vomiting postchemotherapy.   1. Acute renal failure on chronic kidney disease stage III: baseline creatinine is 1.02 from 01/21/2016/GFR 53 Acute kidney injury is likely secondary to volume depletion and possibly ATN post-chemotherapy from nausea and vomiting -  Creatinine still above her baseline at 1.4. BUN also declining. Encouraged by mouth fluid intake. Continue periodically monitor renal function.  2. Hypertension: Blood pressure higher today at 150/61. - Continue to hold losartan and hydrochlorothiazide.  3. Diabetes mellitus type II with chronic kidney disease: Blood glucose up to 174 this a.m. Metformin on hold given acute renal failure.  4. Lung cancer: status post chemotherapy.  - Followed by Dr. [name].  She received PRBC and platelet transfusion yesterday.  5.  Hypokalemia. Potassium still a bit low at 3.2. Likely nutritional in origin. Continue potassium repletion as ordered.  LOS: 8 [name] [name] 11/9/201710:40 AM

## 2016-02-13 NOTE — Progress Notes (Signed)
Physical Therapy Treatment Patient Details Name: NITHYA MERIWEATHER MRN: [id number] DOB: 02/23/1941 Today's Date: 02/13/2016    History of Present Illness Nanami Fonseca  is a 75 y.o. female with a known history of Diabetes, COPD, hypertension, hyperlipidemia, lung cancer currently getting chemotherapy radiation, macular degeneration, history of previous CVA who presented to the hospital due to ongoing nausea vomiting and diarrhea and noted to be in acute renal failure. Patient is currently getting chemoradiation for her lung cancer and had her last chemotherapy treatment about a week ago. She says since this past Friday she is planning having persistent nausea vomiting and has not been able to keep anything down. She went to see her oncologist today at the [facility] was noted to be in acute kidney injury and noted to be acutely dehydrated. She was sent to the hospital for admission. She admits to intermittent abdominal pain associated with her nausea vomiting, but no documented fever, chills. She denies any chest pain, worsening shortness of breath, weight loss, hemoptysis or any other associated symptoms presently. Denies falls in the last 12 months    PT Comments    Pt notes feeling much better today. Pt agreeable to PT. O2 saturation initially 88-89%; performed a few seated exercises with focus on breathing and improved to 92%. Pt demonstrates improved ambulation distance and ability to negotiate up/down 6 steps. Pt takes stand rest breaks only after 100 ft and after stair climbing and able to ambulate another 100 ft back to room. Pt's heart rate increased from 95 to 105 post ambulation/stairs and O2 saturation from 92-93%. Recommendation changed to home with HHPT and 24 hour supervision for safety. Spoke with MD and CM. Pt may discharge today; otherwise continue PT to progress strength and endurance to meet stated goals for functional mobility.   Follow Up Recommendations  Home health  PT;Supervision/Assistance - 24 hour     Equipment Recommendations  None recommended by PT;Other (comment)    Recommendations for Other Services       Precautions / Restrictions Precautions Precautions: Fall Restrictions Weight Bearing Restrictions: No    Mobility  Bed Mobility Overal bed mobility: Modified Independent       Supine to sit: Modified independent (Device/Increase time)     General bed mobility comments: No issues  Transfers Overall transfer level: Needs assistance Equipment used: Rolling walker (2 wheeled) Transfers: Sit to/from Stand Sit to Stand: Supervision         General transfer comment: no physical assist; verbal cues for safe hand placement  Ambulation/Gait Ambulation/Gait assistance: Min guard Ambulation Distance (Feet): 200 Feet (stand rest break post 100 ft) Assistive device: Rolling walker (2 wheeled) Gait Pattern/deviations: Step-through pattern Gait velocity: Decreased Gait velocity interpretation: <1.8 ft/sec, indicative of risk for recurrent falls General Gait Details: no LOB; steady cadence/fluidity   Stairs Stairs: Yes Stairs assistance: Min assist Stair Management: Two rails;Step to pattern;Forwards Number of Stairs: 6 General stair comments: Min guard ascending; Min A descending; cues to advance hands forward more descending to avoid hands behind body once advancing to next step  Wheelchair Mobility    Modified Rankin (Stroke Patients Only)       Balance Overall balance assessment: Needs assistance Sitting-balance support: Feet supported;Feet unsupported Sitting balance-Leahy Scale: Good     Standing balance support: Bilateral upper extremity supported Standing balance-Leahy Scale: Fair                      Cognition Arousal/Alertness: Awake/alert Behavior During Therapy:  WFL for tasks assessed/performed Overall Cognitive Status: Within Functional Limits for tasks assessed                       Exercises General Exercises - Lower Extremity Long Arc Quad: AROM;Both;20 reps;Seated Hip Flexion/Marching: AROM;Both;20 reps;Seated Toe Raises: AROM;Both;20 reps;Seated Heel Raises: AROM;Both;20 reps;Seated    General Comments        Pertinent Vitals/Pain Pain Assessment: No/denies pain    Home Living                      Prior Function            PT Goals (current goals can now be found in the care plan section) Progress towards PT goals: Progressing toward goals    Frequency    Min 2X/week      PT Plan Discharge plan needs to be updated    Co-evaluation             End of Session Equipment Utilized During Treatment: Gait belt Activity Tolerance: Patient tolerated treatment well Patient left: Other (comment) (sitting edge of bed)     Time: 2641-5830 PT Time Calculation (min) (ACUTE ONLY): 26 min  Charges:  $Gait Training: 23-37 mins                    G Codes:      Larae Grooms, PTA 02/13/2016, 12:49 PM

## 2016-02-13 NOTE — Clinical Social Work Note (Signed)
MSW received referral for SNF.  Case discussed with case manager and plan is to discharge home with home health.  MSW to sign off please re-consult if social work needs arise.  Jones Broom. Maxon Kresse, MSW Mon-Fri 8a-4:30p [phone]

## 2016-02-13 NOTE — Progress Notes (Signed)
Discharge instructions given and went over with patient at bedside. All questions answered. Patient discharged home with husband via wheelchair by volunteer services. Madlyn Frankel, RN

## 2016-02-13 NOTE — Discharge Summary (Signed)
[facility] [place] NAME: [name] [name]    MR#:  [id number]  DATE OF BIRTH:  02-27-1941  DATE OF ADMISSION:  02/05/2016   ADMITTING PHYSICIAN: Henreitta Leber, MD  DATE OF DISCHARGE: 02/13/2016  PRIMARY CARE PHYSICIAN: [name], MD   ADMISSION DIAGNOSIS:   Dehydration  DISCHARGE DIAGNOSIS:   Active Problems:   Acute renal failure (ARF) (HCC)   Protein-calorie malnutrition, severe   SECONDARY DIAGNOSIS:   Past Medical History:  Diagnosis Date  . Abnormal CT lung screening 10/17/2015  . COPD (chronic obstructive pulmonary disease) ([place])   . Coronary artery disease, non-occlusive    a. cath 2006: min nonobs CAD; b. cath 12/2010: cath LAD 50%, RCA 60%; c. 08/2013: Minimal luminal irregs, right dominant system with no significant CAD, diffuse luminal irregs noted. Normal EF 55%, no AS or MS.   . Diabetes mellitus   . Hyperlipemia    Followed by Dr. Rosanna Randy  . Hypertension   . Lung cancer ([place])   . Macular degeneration    rt  . Personal history of tobacco use, presenting hazards to health 10/15/2015  . Pneumonia    hx  . Shortness of breath   . Stroke [facility])     HOSPITAL COURSE:   75 year old female with past medical history of CVA, COPD, lung cancer, diabetes, coronary arterydisease, macular degeneration who presents to the hospital due to intractable nausea vomiting and noted to be in acute renal failure.  1. Acute kidney injury-secondary to persistent nausea vomiting and dehydration.  -Prerenal causes and ATN from chemotherapy - baseline cr of 1, now improved to 1.5 since admission - appreciate nephrology consult, stopped IV fluids, avoid nephrotoxins  2. Intractable N/V - due to chemo. Tolerating diet well. -  Added Reglan prior to eating. Will have fluctuating days. - supportive care given here IV fluids, anti-emetics and will monitor.   3. Acute on chronic anemia-secondary to chemotherapy. -Received a  total of 3 units of packed RBC transfusion since admission, plan to keep hemoglobin around 8.  4. 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 Finished radiation, on cycle 3 of chemotherapy. -Follow up with oncology as outpatient after discharge. - cont inhalers and prn nebs for her wheezing -Needing 2 L O2 now. On home O2 at bedtime  5. Pancytopenia-with Leukopenia/thrombocytopenia-secondary to the chemotherapy. Discussed with patient's oncologist. No further recommendations. -4 days course of Neupogen finished, counts starting to improve slowly. F/u at cancer center tomorrow for cbc and neupogen if wbc is low - advised to monitor for any fevers and call cancer center or come to ER. -Received plt Tx - 4 units since admission.  - Cultures negative, UA and chest x-ray with no infection so far. Hold off on antibiotics and monitor.  6. Hyperglycemia- known diabetes, a1c is 9.8 - sugars are elevated as patient drinking mostly sweetened stuff due to her nausea.  Restart her amaryl- dose decreased as her inatke fluctuates, continue actos and metforminat discharge  7. Severe hypomagnesemia and hypokalemia-replaced. On supplements   She has improved significantly. Physical therapy today recommended home health. She is being discharged home today.  DISCHARGE CONDITIONS:   Guarded CONSULTS OBTAINED:   Treatment Team:  Murlean Iba, MD Creola Corn, MD  DRUG ALLERGIES:   Allergies  Allergen Reactions  . Coconut Fatty Acids Swelling    Throat swells   DISCHARGE MEDICATIONS:     Medication List    STOP taking  these medications   aspirin 81 MG tablet   clopidogrel 75 MG tablet Commonly known as:  PLAVIX   losartan-hydrochlorothiazide 100-25 MG tablet Commonly known as:  HYZAAR   naproxen sodium 220 MG tablet Commonly known as:  ANAPROX     TAKE these medications   albuterol (5 MG/ML) 0.5% nebulizer solution Commonly known as:   PROVENTIL Take 2.5 mg by nebulization 2 (two) times daily.   atorvastatin 20 MG tablet Commonly known as:  LIPITOR Take 1 tablet by mouth at  bedtime   budesonide-formoterol 160-4.5 MCG/ACT inhaler Commonly known as:  SYMBICORT Inhale 2 puffs into the lungs 2 (two) times daily.   diazepam 5 MG tablet Commonly known as:  VALIUM Take 1 tablet (5 mg total) by mouth every 6 (six) hours as needed for muscle spasms.   gabapentin 600 MG tablet Commonly known as:  NEURONTIN Take 1 tablet by mouth two  times daily   glimepiride 2 MG tablet Commonly known as:  AMARYL Take 1 tablet (2 mg total) by mouth 2 (two) times daily. What changed:  See the new instructions.   ICAPS Caps Take 2 capsules by mouth daily. Reported on 04/17/2015   isosorbide mononitrate 30 MG 24 hr tablet Commonly known as:  IMDUR Take 1 tablet by mouth  daily   lidocaine-prilocaine cream Commonly known as:  EMLA Apply to port 1-2 hours prior to chemotherapy appointment. Cover with plastic wrap.   magnesium oxide 400 (241.3 Mg) MG tablet Commonly known as:  MAG-OX Take 1 tablet by mouth 2 (two) times daily.   metFORMIN 1000 MG tablet Commonly known as:  GLUCOPHAGE Take 1 tablet by mouth  every day   metoCLOPramide 5 MG/5ML solution Commonly known as:  REGLAN Take 10 mLs (10 mg total) by mouth 4 (four) times daily -  before meals and at bedtime.   metoprolol tartrate 25 MG tablet Commonly known as:  LOPRESSOR Take 1 tablet (25 mg total) by mouth 2 (two) times daily.   nitroGLYCERIN 0.4 MG SL tablet Commonly known as:  NITROSTAT Place 1 tablet (0.4 mg total) under the tongue every 5 (five) minutes as needed for chest pain.   ondansetron 8 MG tablet Commonly known as:  ZOFRAN Take 1 tablet (8 mg total) by mouth every 8 (eight) hours as needed for nausea or vomiting. What changed:  when to take this  reasons to take this   pantoprazole 40 MG tablet Commonly known as:  PROTONIX Take 1 tablet by  mouth  daily   pioglitazone 30 MG tablet Commonly known as:  ACTOS Take 1 tablet (30 mg total) by mouth daily.   potassium chloride 10 MEQ tablet Commonly known as:  K-DUR Take 3 tablets by mouth  daily as directed What changed:  See the new instructions.   prochlorperazine 10 MG tablet Commonly known as:  COMPAZINE Take 1 tablet (10 mg total) by mouth every 6 (six) hours as needed (Nausea or vomiting).        DISCHARGE INSTRUCTIONS:   1. Follow-up with the cancer Center tomorrow for blood work and Neupogen 2. Follow-up with Dr. [name] as prior schedule.  DIET:   Cardiac diet  ACTIVITY:   Activity as tolerated  OXYGEN:   Home Oxygen: No.  Oxygen Delivery: room air On o2 at bedtime  DISCHARGE LOCATION:   home   If you experience worsening of your admission symptoms, develop shortness of breath, life threatening emergency, suicidal or homicidal thoughts you must seek medical attention  immediately by calling 911 or calling your MD immediately  if symptoms less severe.  You Must read complete instructions/literature along with all the possible adverse reactions/side effects for all the Medicines you take and that have been prescribed to you. Take any new Medicines after you have completely understood and accpet all the possible adverse reactions/side effects.   Please note  You were cared for by a hospitalist during your hospital stay. If you have any questions about your discharge medications or the care you received while you were in the hospital after you are discharged, you can call the unit and asked to speak with the hospitalist on call if the hospitalist that took care of you is not available. Once you are discharged, your primary care physician will handle any further medical issues. Please note that NO REFILLS for any discharge medications will be authorized once you are discharged, as it is imperative that you return to your primary care physician (or establish  a relationship with a primary care physician if you do not have one) for your aftercare needs so that they can reassess your need for medications and monitor your lab values.    On the day of Discharge:  VITAL SIGNS:   Blood pressure (!) 144/62, pulse 91, temperature 98 F (36.7 C), temperature source Oral, resp. rate 18, height '5\' 4"'$  (1.626 m), weight 69.8 kg (153 lb 14.4 oz), SpO2 93 %.  PHYSICAL EXAMINATION:   GENERAL:  75 y.o.-year-old patient lying in the bed with no acute distress.  EYES: Pupils equal, round, reactive to light and accommodation. No scleral icterus. Extraocular muscles intact. Conjunctival pallor HEENT: Head atraumatic, normocephalic. Oropharynx and nasopharynx clear.  NECK:  Supple, no jugular venous distention. No thyroid enlargement, no tenderness.  LUNGS: Normal breath sounds bilaterally, no wheezing, rales,rhonchi or crepitation. No use of accessory muscles of respiration. Diminished breath sounds at the bases. Rhonchi on right side. CARDIOVASCULAR: S1, S2 normal. No  rubs, or gallops. 2/6 systolic murmur is present ABDOMEN: Soft, nontender, nondistended. Bowel sounds present. No organomegaly or mass.  EXTREMITIES: No pedal edema, cyanosis, or clubbing.  NEUROLOGIC: Cranial nerves II through XII are intact. Muscle strength 5/5 in all extremities. Sensation intact. Gait not checked.  PSYCHIATRIC: The patient is alert and oriented x 3.  SKIN: No obvious rash, lesion, or ulcer. Tiny petechia noted on both hands on the dorsal surfaces.   DATA REVIEW:   CBC  Recent Labs Lab 02/13/16 0415  WBC 0.4*  HGB 8.5*  HCT 23.8*  PLT 60*    Chemistries   Recent Labs Lab 02/13/16 0415  NA 137  K 3.2*  CL 96*  CO2 31  GLUCOSE 174*  BUN 29*  CREATININE 1.41*  CALCIUM 7.4*  MG 1.8     Microbiology Results  Results for orders placed or performed during the hospital encounter of 02/05/16  CULTURE, BLOOD (ROUTINE X 2) w Reflex to ID Panel     Status: None  (Preliminary result)   Collection Time: 02/10/16  1:56 PM  Result Value Ref Range Status   Specimen Description BLOOD R ARM  Final   Special Requests   Final    BOTTLES DRAWN AEROBIC AND ANAEROBIC AER 3ML ANA 1ML   Culture NO GROWTH 3 DAYS  Final   Report Status PENDING  Incomplete  CULTURE, BLOOD (ROUTINE X 2) w Reflex to ID Panel     Status: None (Preliminary result)   Collection Time: 02/10/16  2:11 PM  Result Value  Ref Range Status   Specimen Description BLOOD L HAND  Final   Special Requests   Final    BOTTLES DRAWN AEROBIC AND ANAEROBIC AER 3ML ANA 2ML   Culture NO GROWTH 3 DAYS  Final   Report Status PENDING  Incomplete    RADIOLOGY:  No results found.   Management plans discussed with the patient, family and they are in agreement.  CODE STATUS:     Code Status Orders        Start     Ordered   02/05/16 1510  Full code  Continuous     02/05/16 1510    Code Status History    Date Active Date Inactive Code Status Order ID Comments User Context   01/27/2015  9:48 PM 01/29/2015  5:59 PM Full Code [id number]  Lytle Butte, MD ED   10/24/2014 12:03 PM 10/25/2014  4:26 PM Full Code [id number]  Algernon Huxley, MD Inpatient   08/30/2014  1:05 AM 08/30/2014  7:48 PM Full Code [id number]  Lance Coon, MD Inpatient   10/19/2013  4:19 PM 10/20/2013  4:04 PM Full Code [id number]  Newman Pies, MD Inpatient    Advance Directive Documentation   Flowsheet Row Most Recent Value  Type of Advance Directive  Healthcare Power of Attorney  Pre-existing out of facility DNR order (yellow form or pink MOST form)  No data  "MOST" Form in Place?  No data      TOTAL TIME TAKING CARE OF THIS PATIENT: 38 minutes.    Gladstone Lighter M.D on 02/13/2016 at 2:02 PM  Between 7am to 6pm - Pager - [phone]  After 6pm go to [email or web address] - password [email or web address] [facility]  [facility]  Office  [phone]  CC: Primary care physician; [name], MD   Note: This  dictation was prepared with Dragon dictation along with smaller phrase technology. Any transcriptional errors that result from this process are unintentional.

## 2016-02-13 NOTE — Care Management (Signed)
Discussed home health agencies. Chose [facility]. Flo Shanks, RN representative for [facility] updated. Shelbie Ammons RN MSN CCM Care Management [phone]

## 2016-02-13 NOTE — Progress Notes (Signed)
Inpatient Diabetes Program Recommendations  AACE/ADA: New Consensus Statement on Inpatient Glycemic Control (2015)  Target Ranges:  Prepandial:   less than 140 mg/dL      Peak postprandial:   less than 180 mg/dL (1-2 hours)      Critically ill patients:  140 - 180 mg/dL   Results for [name] [name], [name] [name] (MRN [id number]) as of 02/13/2016 12:12  Ref. Range 02/12/2016 07:33 02/12/2016 11:49 02/12/2016 16:37 02/12/2016 21:16 02/13/2016 07:20 02/13/2016 11:57  Glucose-Capillary Latest Ref Range: 65 - 99 mg/dL 171 (H) 262 (H) 193 (H) 192 (H) 180 (H) 321 (H)   Review of Glycemic Control  Diabetes history: DM2 Outpatient Diabetes medications: Actos 30 mg daily, Amaryl 4 mg BID, Metformin 1000 mg daily Current orders for Inpatient glycemic control: Lantus 12 units QHS, Novolog 0-15 units TID with meals, Novolog 0-5 units QHS  Inpatient Diabetes Program Recommendations:  Insulin - Meal Coverage: If patient is eating at least 50% of meals, please consider ordering Novolog 3 units TID with meals for meal coverage. Diet: Please consider changing Ensure to Glucerna supplements.  Thanks, Barnie Alderman, RN, MSN, CDE Diabetes Coordinator Inpatient Diabetes Program [phone] (Team Pager from 8am to 5pm)

## 2016-02-13 NOTE — Progress Notes (Signed)
Pharmacy Consult for electrolyte replacement  Allergies  Allergen Reactions  . Coconut Fatty Acids Swelling    Throat swells   Labs:  Recent Labs  02/11/16 0345 02/12/16 0430 02/13/16 0415  WBC 0.2* 0.3* 0.4*  HGB 8.2* 7.7* 8.5*  HCT 23.7* 21.6* 23.8*  PLT 25* 15* 60*     Recent Labs  02/11/16 0345 02/11/16 0347 02/12/16 0430 02/12/16 1942 02/13/16 0415  NA 138  --  136  --  137  K 3.5  --  3.2* 3.4* 3.2*  CL 98*  --  95*  --  96*  CO2 30  --  30  --  31  GLUCOSE 183*  --  159*  --  174*  BUN 27*  --  32*  --  29*  CREATININE 1.48*  --  1.55*  --  1.41*  CALCIUM 6.5*  --  6.5*  --  7.4*  MG 1.0*  --  1.7  --  1.8  PHOS  --  2.4* 3.2  --   --   ALBUMIN  --   --  2.8*  --   --    Estimated Creatinine Clearance: 34.1 mL/min (by C-G formula based on SCr of 1.41 mg/dL (H)).   Assessment: Pharmacy consulted to replace electrolytes in this 75 year old female admitted with intractable nausea and vomiting due to chemotherapy  Plan:  K = 3.2 this morning - has already received KCl40 mEq PO x 1 dose this morning. Will check electrolytes with AM labs tomorrow.  [name] [name], PharmD, BCPS 02/13/2016 10:11 AM

## 2016-02-14 ENCOUNTER — Telehealth: Payer: Self-pay | Admitting: *Deleted

## 2016-02-14 ENCOUNTER — Other Ambulatory Visit: Payer: Self-pay | Admitting: *Deleted

## 2016-02-14 ENCOUNTER — Inpatient Hospital Stay: Payer: Medicare Other

## 2016-02-14 DIAGNOSIS — Z79899 Other long term (current) drug therapy: Secondary | ICD-10-CM | POA: Diagnosis not present

## 2016-02-14 DIAGNOSIS — C3411 Malignant neoplasm of upper lobe, right bronchus or lung: Secondary | ICD-10-CM

## 2016-02-14 DIAGNOSIS — D701 Agranulocytosis secondary to cancer chemotherapy: Secondary | ICD-10-CM

## 2016-02-14 DIAGNOSIS — R531 Weakness: Secondary | ICD-10-CM | POA: Diagnosis not present

## 2016-02-14 DIAGNOSIS — I10 Essential (primary) hypertension: Secondary | ICD-10-CM | POA: Diagnosis not present

## 2016-02-14 DIAGNOSIS — Z801 Family history of malignant neoplasm of trachea, bronchus and lung: Secondary | ICD-10-CM | POA: Diagnosis not present

## 2016-02-14 DIAGNOSIS — E785 Hyperlipidemia, unspecified: Secondary | ICD-10-CM | POA: Diagnosis not present

## 2016-02-14 DIAGNOSIS — T451X5A Adverse effect of antineoplastic and immunosuppressive drugs, initial encounter: Secondary | ICD-10-CM

## 2016-02-14 DIAGNOSIS — R112 Nausea with vomiting, unspecified: Secondary | ICD-10-CM | POA: Diagnosis not present

## 2016-02-14 DIAGNOSIS — D709 Neutropenia, unspecified: Secondary | ICD-10-CM | POA: Diagnosis not present

## 2016-02-14 DIAGNOSIS — R05 Cough: Secondary | ICD-10-CM | POA: Diagnosis not present

## 2016-02-14 DIAGNOSIS — Z7689 Persons encountering health services in other specified circumstances: Secondary | ICD-10-CM | POA: Diagnosis not present

## 2016-02-14 DIAGNOSIS — Z8673 Personal history of transient ischemic attack (TIA), and cerebral infarction without residual deficits: Secondary | ICD-10-CM | POA: Diagnosis not present

## 2016-02-14 DIAGNOSIS — J449 Chronic obstructive pulmonary disease, unspecified: Secondary | ICD-10-CM | POA: Diagnosis not present

## 2016-02-14 DIAGNOSIS — D649 Anemia, unspecified: Secondary | ICD-10-CM | POA: Diagnosis not present

## 2016-02-14 DIAGNOSIS — D696 Thrombocytopenia, unspecified: Secondary | ICD-10-CM | POA: Diagnosis not present

## 2016-02-14 DIAGNOSIS — Z8701 Personal history of pneumonia (recurrent): Secondary | ICD-10-CM | POA: Diagnosis not present

## 2016-02-14 DIAGNOSIS — R5383 Other fatigue: Secondary | ICD-10-CM | POA: Diagnosis not present

## 2016-02-14 DIAGNOSIS — J90 Pleural effusion, not elsewhere classified: Secondary | ICD-10-CM | POA: Diagnosis not present

## 2016-02-14 DIAGNOSIS — E1165 Type 2 diabetes mellitus with hyperglycemia: Secondary | ICD-10-CM | POA: Diagnosis not present

## 2016-02-14 DIAGNOSIS — I2510 Atherosclerotic heart disease of native coronary artery without angina pectoris: Secondary | ICD-10-CM | POA: Diagnosis not present

## 2016-02-14 DIAGNOSIS — R0602 Shortness of breath: Secondary | ICD-10-CM | POA: Diagnosis not present

## 2016-02-14 DIAGNOSIS — Z87891 Personal history of nicotine dependence: Secondary | ICD-10-CM | POA: Diagnosis not present

## 2016-02-14 DIAGNOSIS — Z7984 Long term (current) use of oral hypoglycemic drugs: Secondary | ICD-10-CM | POA: Diagnosis not present

## 2016-02-14 LAB — CBC WITH DIFFERENTIAL/PLATELET
BASOS ABS: 0 10*3/uL (ref 0–0.1)
BASOS PCT: 0 %
EOS ABS: 0 10*3/uL (ref 0–0.7)
EOS PCT: 0 %
HCT: 27.1 % — ABNORMAL LOW (ref 35.0–47.0)
Hemoglobin: 9.6 g/dL — ABNORMAL LOW (ref 12.0–16.0)
Lymphocytes Relative: 22 %
Lymphs Abs: 0.1 10*3/uL — ABNORMAL LOW (ref 1.0–3.6)
MCH: 31.8 pg (ref 26.0–34.0)
MCHC: 35.5 g/dL (ref 32.0–36.0)
MCV: 89.6 fL (ref 80.0–100.0)
MONO ABS: 0.1 10*3/uL — AB (ref 0.2–0.9)
Monocytes Relative: 8 %
Neutro Abs: 0.5 10*3/uL — ABNORMAL LOW (ref 1.4–6.5)
Neutrophils Relative %: 70 %
PLATELETS: 35 10*3/uL — AB (ref 150–440)
RBC: 3.02 MIL/uL — AB (ref 3.80–5.20)
RDW: 16 % — AB (ref 11.5–14.5)
WBC: 0.7 10*3/uL — AB (ref 3.6–11.0)

## 2016-02-14 LAB — COMPREHENSIVE METABOLIC PANEL
ALK PHOS: 84 U/L (ref 38–126)
ALT: 24 U/L (ref 14–54)
AST: 19 U/L (ref 15–41)
Albumin: 3.4 g/dL — ABNORMAL LOW (ref 3.5–5.0)
Anion gap: 12 (ref 5–15)
BILIRUBIN TOTAL: 0.6 mg/dL (ref 0.3–1.2)
BUN: 38 mg/dL — AB (ref 6–20)
CALCIUM: 8.1 mg/dL — AB (ref 8.9–10.3)
CO2: 27 mmol/L (ref 22–32)
CREATININE: 1.83 mg/dL — AB (ref 0.44–1.00)
Chloride: 92 mmol/L — ABNORMAL LOW (ref 101–111)
GFR calc Af Amer: 30 mL/min — ABNORMAL LOW (ref >60)
GFR, EST NON AFRICAN AMERICAN: 26 mL/min — AB (ref >60)
GLUCOSE: 301 mg/dL — AB (ref 65–99)
POTASSIUM: 4.3 mmol/L (ref 3.5–5.1)
Sodium: 131 mmol/L — ABNORMAL LOW (ref 135–145)
TOTAL PROTEIN: 7.3 g/dL (ref 6.5–8.1)

## 2016-02-14 LAB — SAMPLE TO BLOOD BANK

## 2016-02-14 MED ORDER — TBO-FILGRASTIM 480 MCG/0.8ML ~~LOC~~ SOSY
480.0000 ug | PREFILLED_SYRINGE | Freq: Once | SUBCUTANEOUS | Status: AC
  Administered 2016-02-14: 480 ug via SUBCUTANEOUS
  Filled 2016-02-14: qty 0.8

## 2016-02-14 NOTE — Telephone Encounter (Signed)
Pt was in hospital for intractable n/v and neutropenia.  Upon her discharge from [place]. She was given appt for cbc and poss. neupogen .  WBC today was.7 and per Mike Gip on call will give 480 mcg granix today and order pt to come in 11/13 and check labs and poss. granix again. Orders entered. And in basket to [place] to schedule pt for Monday appt.

## 2016-02-15 LAB — CULTURE, BLOOD (ROUTINE X 2)
CULTURE: NO GROWTH
Culture: NO GROWTH

## 2016-02-16 ENCOUNTER — Other Ambulatory Visit: Payer: Self-pay | Admitting: Oncology

## 2016-02-16 NOTE — Progress Notes (Signed)
[name] [name]  [phone] [phone]  ID: [name] OB: 1940-11-02  MR#: [id number]  XNT#:[id number]  Patient Care Team: Jerrol Banana., MD as PCP - General (Unknown Physician Specialty) [name], MD as Consulting Physician (Cardiology) Algernon Huxley, MD as Consulting Physician (Vascular Surgery)  CHIEF COMPLAINT: Clinical stage IIIa small cell lung carcinoma of the right upper lobe lung.  INTERVAL HISTORY: Patient returns to clinic today for further evaluation and in hospital follow-up. She continues to have significant weakness and fatigue, but states this is improving. Her appetite and PO intake have also improved. She has no neurologic complaints. She denies any fevers. She has no chest pain, but continues to have chronic shortness of breath and cough. She denies any constipation or diarrhea. She has no urinary complaints. Patient offers no further specific complaints today.  REVIEW OF SYSTEMS:   Review of Systems  Constitutional: Positive for malaise/fatigue. Negative for fever and weight loss.  Respiratory: Positive for cough and shortness of breath.   Cardiovascular: Negative.  Negative for chest pain and leg swelling.  Gastrointestinal: Positive for nausea. Negative for abdominal pain and vomiting.  Genitourinary: Negative.   Musculoskeletal: Negative.   Neurological: Positive for weakness. Negative for sensory change.  Psychiatric/Behavioral: Negative.  The patient is not nervous/anxious.     As per HPI. Otherwise, a complete review of systems is negative.  PAST MEDICAL HISTORY: Past Medical History:  Diagnosis Date  . Abnormal CT lung screening 10/17/2015  . COPD (chronic obstructive pulmonary disease) ([place])   . Coronary artery disease, non-occlusive    a. cath 2006: min nonobs CAD; b. cath 12/2010: cath LAD 50%, RCA 60%; c. 08/2013: Minimal luminal irregs, right dominant system with no significant CAD, diffuse luminal  irregs noted. Normal EF 55%, no AS or MS.   . Diabetes mellitus   . Hyperlipemia    Followed by Dr. Rosanna Randy  . Hypertension   . Lung cancer ([place])   . Macular degeneration    rt  . Personal history of tobacco use, presenting hazards to health 10/15/2015  . Pneumonia    hx  . Shortness of breath   . Stroke [facility])     PAST SURGICAL HISTORY: Past Surgical History:  Procedure Laterality Date  . ABDOMINAL HYSTERECTOMY    . ANTERIOR CERVICAL DECOMP/DISCECTOMY FUSION N/A 10/19/2013   Procedure: CERVICAL FIVE-SIX ANTERIOR CERVICAL DECOMPRESSION WITH FUSION INTERBODY PROSTHESIS PLATING AND PEEK CAGE;  Surgeon: Ophelia Charter, MD;  Location: [place] NEURO ORS;  Service: Neurosurgery;  Laterality: N/A;  . BACK SURGERY  80's  . BREAST CYST EXCISION Left    left negative   . CARDIAC CATHETERIZATION  05/2004  . CATARACT EXTRACTION Left   . CHOLECYSTECTOMY    . ENDARTERECTOMY Left 10/24/2014   Procedure: ENDARTERECTOMY CAROTID;  Surgeon: Algernon Huxley, MD;  Location: [facility] ORS;  Service: Vascular;  Laterality: Left;  . ENDOBRONCHIAL ULTRASOUND N/A 11/14/2015   Procedure: ENDOBRONCHIAL ULTRASOUND;  Surgeon: [name], MD;  Location: [facility] ORS;  Service: Cardiopulmonary;  Laterality: N/A;  . PERIPHERAL VASCULAR CATHETERIZATION N/A 12/04/2015   Procedure: Glori Luis Cath Insertion;  Surgeon: Algernon Huxley, MD;  Location: [place] CV LAB;  Service: Cardiovascular;  Laterality: N/A;  . TONSILLECTOMY AND ADENOIDECTOMY    . VESICOVAGINAL FISTULA CLOSURE W/ TAH      FAMILY HISTORY: Family History  Problem Relation Age of Onset  . Stroke Mother     Massive  . Heart attack Father  Massive  . Heart attack Brother   . Heart attack Brother   . Lung cancer Maternal Grandfather   . Heart attack Paternal Grandmother     MI       ADVANCED DIRECTIVES (Y/N):  N   HEALTH MAINTENANCE: Social History  Substance Use Topics  . Smoking status: Former Smoker    Packs/day: 1.00    Years: 50.00    Types:  Cigarettes    Quit date: 10/03/2015  . Smokeless tobacco: Never Used     Comment: smokes 3 cigs daily 05/06/15. Pt instructed to quit.  . Alcohol use No     Colonoscopy:  PAP:  Bone density:  Lipid panel:  Allergies  Allergen Reactions  . Coconut Fatty Acids Swelling    Throat swells    Current Outpatient Prescriptions  Medication Sig Dispense Refill  . albuterol (PROVENTIL) (5 MG/ML) 0.5% nebulizer solution Take 2.5 mg by nebulization 2 (two) times daily.    Marland Kitchen atorvastatin (LIPITOR) 20 MG tablet Take 1 tablet by mouth at  bedtime 90 tablet 3  . budesonide-formoterol (SYMBICORT) 160-4.5 MCG/ACT inhaler Inhale 2 puffs into the lungs 2 (two) times daily. 1 Inhaler 0  . diazepam (VALIUM) 5 MG tablet Take 1 tablet (5 mg total) by mouth every 6 (six) hours as needed for muscle spasms. 50 tablet 1  . gabapentin (NEURONTIN) 600 MG tablet Take 1 tablet by mouth two  times daily 180 tablet 3  . glimepiride (AMARYL) 2 MG tablet Take 1 tablet (2 mg total) by mouth 2 (two) times daily. 60 tablet 2  . isosorbide mononitrate (IMDUR) 30 MG 24 hr tablet Take 1 tablet by mouth  daily 90 tablet 3  . lidocaine-prilocaine (EMLA) cream Apply to port 1-2 hours prior to chemotherapy appointment. Cover with plastic wrap. 90 g 3  . magnesium oxide (MAG-OX) 400 (241.3 MG) MG tablet Take 1 tablet by mouth 2 (two) times daily.    . metFORMIN (GLUCOPHAGE) 1000 MG tablet Take 1 tablet by mouth  every day 90 tablet 3  . metoCLOPramide (REGLAN) 5 MG/5ML solution Take 10 mLs (10 mg total) by mouth 4 (four) times daily -  before meals and at bedtime. 120 mL 0  . metoprolol tartrate (LOPRESSOR) 25 MG tablet Take 1 tablet (25 mg total) by mouth 2 (two) times daily. 60 tablet 2  . Multiple Vitamins-Minerals (ICAPS) CAPS Take 2 capsules by mouth daily. Reported on 04/17/2015    . nitroGLYCERIN (NITROSTAT) 0.4 MG SL tablet Place 1 tablet (0.4 mg total) under the tongue every 5 (five) minutes as needed for chest pain. 25  tablet 3  . ondansetron (ZOFRAN) 8 MG tablet Take 1 tablet (8 mg total) by mouth every 8 (eight) hours as needed for nausea or vomiting. 30 tablet 1  . pantoprazole (PROTONIX) 40 MG tablet Take 1 tablet by mouth  daily 90 tablet 3  . pioglitazone (ACTOS) 30 MG tablet Take 1 tablet (30 mg total) by mouth daily. 90 tablet 3  . potassium chloride (K-DUR) 10 MEQ tablet Take 3 tablets by mouth  daily as directed (Patient taking differently: Take 1 tablet by mouth 3 times daily) 270 tablet 1  . prochlorperazine (COMPAZINE) 10 MG tablet Take 1 tablet (10 mg total) by mouth every 6 (six) hours as needed (Nausea or vomiting). 30 tablet 1   No current facility-administered medications for this visit.    Facility-Administered Medications Ordered in Other Visits  Medication Dose Route Frequency Provider Last Rate Last Dose  .  ondansetron (ZOFRAN) 8 mg, dexamethasone (DECADRON) 10 mg in sodium chloride 0.9 % 50 mL IVPB   Intravenous Once Lloyd Huger, MD        OBJECTIVE: Vitals:   02/17/16 1102  BP: (!) 147/74  Pulse: 83  Resp: 18  Temp: (!) 96.5 F (35.8 C)     Body mass index is 26.45 kg/m.    ECOG FS:1 - Symptomatic but completely ambulatory  General: Well-developed, well-nourished, no acute distress. Eyes: Pink conjunctiva, anicteric sclera. Lungs: Clear to auscultation bilaterally. Heart: Regular rate and rhythm. No rubs, murmurs, or gallops. Abdomen: Soft, nontender, nondistended. No organomegaly noted, normoactive bowel sounds. Musculoskeletal: No edema, cyanosis, or clubbing. Neuro: Alert, answering all questions appropriately. Cranial nerves grossly intact. Skin: No rashes or petechiae noted. Psych: Normal affect.   LAB RESULTS:  Lab Results  Component Value Date   NA 131 (L) 02/14/2016   K 4.3 02/14/2016   CL 92 (L) 02/14/2016   CO2 27 02/14/2016   GLUCOSE 301 (H) 02/14/2016   BUN 38 (H) 02/14/2016   CREATININE 1.83 (H) 02/14/2016   CALCIUM 8.1 (L) 02/14/2016    PROT 7.3 02/14/2016   ALBUMIN 3.4 (L) 02/14/2016   AST 19 02/14/2016   ALT 24 02/14/2016   ALKPHOS 84 02/14/2016   BILITOT 0.6 02/14/2016   GFRNONAA 26 (L) 02/14/2016   GFRAA 30 (L) 02/14/2016    Lab Results  Component Value Date   WBC 2.9 (L) 02/17/2016   NEUTROABS 2.1 02/17/2016   HGB 8.0 (L) 02/17/2016   HCT 22.9 (L) 02/17/2016   MCV 91.1 02/17/2016   PLT 45 (L) 02/17/2016     STUDIES: Dg Chest 2 View  Result Date: 02/10/2016 CLINICAL DATA:  Fever cough and vomiting for 1 day EXAM: CHEST  2 VIEW COMPARISON:  01/21/2016 FINDINGS: Left-sided central venous port tip overlies the SVC. Surgical hardware in the lower cervical spine. Prominent interstitial opacities within the lingula and lung bases are unchanged. No interval consolidation. Development of tiny pleural effusions. Stable cardiomediastinal silhouette. No pneumothorax. IMPRESSION: 1. Development of tiny bilateral effusions 2. No interval consolidation. Electronically Signed   By: Donavan Foil M.D.   On: 02/10/2016 15:03   Dg Chest 2 View  Result Date: 01/21/2016 CLINICAL DATA:  Chronic cough and shortness of breath. Stage III small cell right upper lobe lung cancer. EXAM: CHEST  2 VIEW COMPARISON:  PET-CT 10/23/2015. Chest CT 10/15/2015. Chest radiographs 01/27/2015. FINDINGS: Left jugular Port-A-Cath terminates over the lower SVC. The cardiomediastinal silhouette is within normal limits. Right upper lobe nodule on PET-CT is not well seen on these radiographs. No airspace consolidation, edema, pleural effusion, or pneumothorax is identified. Right upper quadrant abdominal surgical clips and prior ACDF are noted. No acute osseous abnormality is seen. IMPRESSION: No evidence of acute cardiopulmonary process. Electronically Signed   By: Logan Bores M.D.   On: 01/21/2016 12:14   US Renal  Result Date: 02/05/2016 CLINICAL DATA:  Acute renal failure EXAM: RENAL / URINARY TRACT ULTRASOUND COMPLETE COMPARISON:  PET-CT October 23, 2015 FINDINGS: Right Kidney: Length: 10.6 cm. Echogenicity and renal cortical thickness are within normal limits. No mass, perinephric fluid, or hydronephrosis visualized. There is no sonographically demonstrable calculus or ureterectasis. Left Kidney: Length: 10.1 cm. Echogenicity and renal cortical thickness are within normal limits. No mass, perinephric fluid, or hydronephrosis visualized. There is no sonographically demonstrable calculus or ureterectasis. Bladder: Appears normal for degree of bladder distention. IMPRESSION: Study within normal limits. Electronically Signed  By: Lowella Grip III M.D.   On: 02/05/2016 16:00    ASSESSMENT: Clinical stage IIIa small cell lung carcinoma of the right upper lobe lung.  PLAN:    1. Clinical stage IIIa small cell lung carcinoma of the right upper lobe lung: CT and PET scan results reviewed independently concerning for stage IIIa disease given patient's 11 mm right paratracheal lymph node positive for small cell lung cancer. Patient has no other evidence of metastatic disease. MRI the brain is negative. Patient completed cycle 3 of cisplatin and etoposide on October 24-26. Given patient's extensive hospitalization after cycle 3, will discontinue treatment altogether and proceed with restaging PET scan in mid December or approximately 6 weeks after her last treatment. Return to clinic 1-2 days later to discuss the results. 2. Neutropenia: Patient has received multiple injections of Neupogen and now her [place] has improved significantly. No further injections are needed at this time. Return to clinic in 2 weeks for repeat laboratory work. 3. Thrombocytopenia: Patient's platelet count is 45, which is now trending up. Monitor. Patient required multiple platelet transfusions while in the hospital. 4. Anemia: Decreased, but stable. Patient also required blood transfusion on the hospital. No further intervention is needed. Monitor. 5. Cough: Continue OTC treatments  as needed. 6. Hyperglycemia: Patient's blood sugars significantly elevated today of greater than 300.  7. Nausea and vomiting: Continue Zofran and Compazine as needed.   Patient expressed understanding and was in agreement with this plan. She also understands that She can call clinic at any time with any questions, concerns, or complaints.   Primary cancer of right upper lobe of lung [facility])   Staging form: Lung, AJCC 7th Edition   - Clinical stage from 11/26/2015: Stage IIIA (T1a, N2, M0) - Signed by Lloyd Huger, MD on 11/26/2015  Lloyd Huger, MD   02/17/2016 12:58 PM

## 2016-02-17 ENCOUNTER — Other Ambulatory Visit: Payer: Self-pay

## 2016-02-17 ENCOUNTER — Inpatient Hospital Stay: Payer: Medicare Other

## 2016-02-17 ENCOUNTER — Ambulatory Visit: Payer: Self-pay

## 2016-02-17 ENCOUNTER — Inpatient Hospital Stay (HOSPITAL_BASED_OUTPATIENT_CLINIC_OR_DEPARTMENT_OTHER): Payer: Medicare Other | Admitting: Oncology

## 2016-02-17 ENCOUNTER — Telehealth: Payer: Self-pay | Admitting: *Deleted

## 2016-02-17 VITALS — BP 147/74 | HR 83 | Temp 96.5°F | Resp 18 | Wt 154.1 lb

## 2016-02-17 DIAGNOSIS — D696 Thrombocytopenia, unspecified: Secondary | ICD-10-CM | POA: Diagnosis not present

## 2016-02-17 DIAGNOSIS — R0602 Shortness of breath: Secondary | ICD-10-CM | POA: Diagnosis not present

## 2016-02-17 DIAGNOSIS — I10 Essential (primary) hypertension: Secondary | ICD-10-CM

## 2016-02-17 DIAGNOSIS — C3411 Malignant neoplasm of upper lobe, right bronchus or lung: Secondary | ICD-10-CM

## 2016-02-17 DIAGNOSIS — R05 Cough: Secondary | ICD-10-CM

## 2016-02-17 DIAGNOSIS — Z801 Family history of malignant neoplasm of trachea, bronchus and lung: Secondary | ICD-10-CM

## 2016-02-17 DIAGNOSIS — T451X5A Adverse effect of antineoplastic and immunosuppressive drugs, initial encounter: Secondary | ICD-10-CM

## 2016-02-17 DIAGNOSIS — Z8701 Personal history of pneumonia (recurrent): Secondary | ICD-10-CM

## 2016-02-17 DIAGNOSIS — R5383 Other fatigue: Secondary | ICD-10-CM

## 2016-02-17 DIAGNOSIS — R531 Weakness: Secondary | ICD-10-CM

## 2016-02-17 DIAGNOSIS — Z79899 Other long term (current) drug therapy: Secondary | ICD-10-CM | POA: Diagnosis not present

## 2016-02-17 DIAGNOSIS — J449 Chronic obstructive pulmonary disease, unspecified: Secondary | ICD-10-CM

## 2016-02-17 DIAGNOSIS — J90 Pleural effusion, not elsewhere classified: Secondary | ICD-10-CM

## 2016-02-17 DIAGNOSIS — E1165 Type 2 diabetes mellitus with hyperglycemia: Secondary | ICD-10-CM | POA: Diagnosis not present

## 2016-02-17 DIAGNOSIS — Z87891 Personal history of nicotine dependence: Secondary | ICD-10-CM

## 2016-02-17 DIAGNOSIS — I2510 Atherosclerotic heart disease of native coronary artery without angina pectoris: Secondary | ICD-10-CM | POA: Diagnosis not present

## 2016-02-17 DIAGNOSIS — D701 Agranulocytosis secondary to cancer chemotherapy: Secondary | ICD-10-CM

## 2016-02-17 DIAGNOSIS — R112 Nausea with vomiting, unspecified: Secondary | ICD-10-CM

## 2016-02-17 DIAGNOSIS — Z7984 Long term (current) use of oral hypoglycemic drugs: Secondary | ICD-10-CM | POA: Diagnosis not present

## 2016-02-17 DIAGNOSIS — D709 Neutropenia, unspecified: Secondary | ICD-10-CM | POA: Diagnosis not present

## 2016-02-17 DIAGNOSIS — D649 Anemia, unspecified: Secondary | ICD-10-CM | POA: Diagnosis not present

## 2016-02-17 DIAGNOSIS — Z7689 Persons encountering health services in other specified circumstances: Secondary | ICD-10-CM | POA: Diagnosis not present

## 2016-02-17 DIAGNOSIS — E785 Hyperlipidemia, unspecified: Secondary | ICD-10-CM | POA: Diagnosis not present

## 2016-02-17 DIAGNOSIS — Z8673 Personal history of transient ischemic attack (TIA), and cerebral infarction without residual deficits: Secondary | ICD-10-CM | POA: Diagnosis not present

## 2016-02-17 LAB — CBC WITH DIFFERENTIAL/PLATELET
Basophils Absolute: 0 10*3/uL (ref 0–0.1)
Basophils Relative: 1 %
Eosinophils Absolute: 0 10*3/uL (ref 0–0.7)
Eosinophils Relative: 0 %
HCT: 22.9 % — ABNORMAL LOW (ref 35.0–47.0)
Hemoglobin: 8 g/dL — ABNORMAL LOW (ref 12.0–16.0)
Lymphocytes Relative: 18 %
Lymphs Abs: 0.5 10*3/uL — ABNORMAL LOW (ref 1.0–3.6)
MCH: 31.9 pg (ref 26.0–34.0)
MCHC: 35 g/dL (ref 32.0–36.0)
MCV: 91.1 fL (ref 80.0–100.0)
Monocytes Absolute: 0.3 10*3/uL (ref 0.2–0.9)
Monocytes Relative: 11 %
Neutro Abs: 2.1 10*3/uL (ref 1.4–6.5)
Neutrophils Relative %: 70 %
Platelets: 45 10*3/uL — ABNORMAL LOW (ref 150–440)
RBC: 2.52 MIL/uL — ABNORMAL LOW (ref 3.80–5.20)
RDW: 16.4 % — ABNORMAL HIGH (ref 11.5–14.5)
WBC: 2.9 10*3/uL — ABNORMAL LOW (ref 3.6–11.0)

## 2016-02-17 NOTE — Progress Notes (Signed)
States is feeling better today. Had one episode of vomiting last not. Offers no complaints this morning.

## 2016-02-17 NOTE — Telephone Encounter (Signed)
Debra from [facility] needs the order for Home health re faxed with the date on it. If you need the original you can call her at [phone]

## 2016-02-18 ENCOUNTER — Inpatient Hospital Stay: Payer: Medicare Other

## 2016-02-18 ENCOUNTER — Inpatient Hospital Stay: Payer: Medicare Other | Admitting: Oncology

## 2016-02-18 DIAGNOSIS — Z7984 Long term (current) use of oral hypoglycemic drugs: Secondary | ICD-10-CM | POA: Diagnosis not present

## 2016-02-18 DIAGNOSIS — E43 Unspecified severe protein-calorie malnutrition: Secondary | ICD-10-CM | POA: Diagnosis not present

## 2016-02-18 DIAGNOSIS — N183 Chronic kidney disease, stage 3 (moderate): Secondary | ICD-10-CM | POA: Diagnosis not present

## 2016-02-18 DIAGNOSIS — I10 Essential (primary) hypertension: Secondary | ICD-10-CM | POA: Diagnosis not present

## 2016-02-18 DIAGNOSIS — D6481 Anemia due to antineoplastic chemotherapy: Secondary | ICD-10-CM | POA: Diagnosis not present

## 2016-02-18 DIAGNOSIS — J449 Chronic obstructive pulmonary disease, unspecified: Secondary | ICD-10-CM | POA: Diagnosis not present

## 2016-02-18 DIAGNOSIS — E1122 Type 2 diabetes mellitus with diabetic chronic kidney disease: Secondary | ICD-10-CM | POA: Diagnosis not present

## 2016-02-18 DIAGNOSIS — C3491 Malignant neoplasm of unspecified part of right bronchus or lung: Secondary | ICD-10-CM | POA: Diagnosis not present

## 2016-02-19 ENCOUNTER — Inpatient Hospital Stay: Payer: Medicare Other

## 2016-02-19 DIAGNOSIS — N183 Chronic kidney disease, stage 3 (moderate): Secondary | ICD-10-CM | POA: Diagnosis not present

## 2016-02-19 DIAGNOSIS — E43 Unspecified severe protein-calorie malnutrition: Secondary | ICD-10-CM | POA: Diagnosis not present

## 2016-02-19 DIAGNOSIS — E1122 Type 2 diabetes mellitus with diabetic chronic kidney disease: Secondary | ICD-10-CM | POA: Diagnosis not present

## 2016-02-19 DIAGNOSIS — J449 Chronic obstructive pulmonary disease, unspecified: Secondary | ICD-10-CM | POA: Diagnosis not present

## 2016-02-19 DIAGNOSIS — I10 Essential (primary) hypertension: Secondary | ICD-10-CM | POA: Diagnosis not present

## 2016-02-19 DIAGNOSIS — D6481 Anemia due to antineoplastic chemotherapy: Secondary | ICD-10-CM | POA: Diagnosis not present

## 2016-02-19 DIAGNOSIS — C3491 Malignant neoplasm of unspecified part of right bronchus or lung: Secondary | ICD-10-CM | POA: Diagnosis not present

## 2016-02-19 DIAGNOSIS — Z7984 Long term (current) use of oral hypoglycemic drugs: Secondary | ICD-10-CM | POA: Diagnosis not present

## 2016-02-20 ENCOUNTER — Ambulatory Visit: Payer: Self-pay

## 2016-02-24 DIAGNOSIS — C3491 Malignant neoplasm of unspecified part of right bronchus or lung: Secondary | ICD-10-CM | POA: Diagnosis not present

## 2016-02-24 DIAGNOSIS — E1122 Type 2 diabetes mellitus with diabetic chronic kidney disease: Secondary | ICD-10-CM | POA: Diagnosis not present

## 2016-02-24 DIAGNOSIS — I10 Essential (primary) hypertension: Secondary | ICD-10-CM | POA: Diagnosis not present

## 2016-02-24 DIAGNOSIS — D6481 Anemia due to antineoplastic chemotherapy: Secondary | ICD-10-CM | POA: Diagnosis not present

## 2016-02-24 DIAGNOSIS — Z7984 Long term (current) use of oral hypoglycemic drugs: Secondary | ICD-10-CM | POA: Diagnosis not present

## 2016-02-24 DIAGNOSIS — E43 Unspecified severe protein-calorie malnutrition: Secondary | ICD-10-CM | POA: Diagnosis not present

## 2016-02-24 DIAGNOSIS — J449 Chronic obstructive pulmonary disease, unspecified: Secondary | ICD-10-CM | POA: Diagnosis not present

## 2016-02-24 DIAGNOSIS — N183 Chronic kidney disease, stage 3 (moderate): Secondary | ICD-10-CM | POA: Diagnosis not present

## 2016-02-24 DIAGNOSIS — R0602 Shortness of breath: Secondary | ICD-10-CM | POA: Diagnosis not present

## 2016-02-25 DIAGNOSIS — I10 Essential (primary) hypertension: Secondary | ICD-10-CM | POA: Diagnosis not present

## 2016-02-25 DIAGNOSIS — Z7984 Long term (current) use of oral hypoglycemic drugs: Secondary | ICD-10-CM | POA: Diagnosis not present

## 2016-02-25 DIAGNOSIS — J449 Chronic obstructive pulmonary disease, unspecified: Secondary | ICD-10-CM | POA: Diagnosis not present

## 2016-02-25 DIAGNOSIS — N183 Chronic kidney disease, stage 3 (moderate): Secondary | ICD-10-CM | POA: Diagnosis not present

## 2016-02-25 DIAGNOSIS — E1122 Type 2 diabetes mellitus with diabetic chronic kidney disease: Secondary | ICD-10-CM | POA: Diagnosis not present

## 2016-02-25 DIAGNOSIS — C3491 Malignant neoplasm of unspecified part of right bronchus or lung: Secondary | ICD-10-CM | POA: Diagnosis not present

## 2016-02-25 DIAGNOSIS — D6481 Anemia due to antineoplastic chemotherapy: Secondary | ICD-10-CM | POA: Diagnosis not present

## 2016-02-25 DIAGNOSIS — E43 Unspecified severe protein-calorie malnutrition: Secondary | ICD-10-CM | POA: Diagnosis not present

## 2016-02-26 DIAGNOSIS — N183 Chronic kidney disease, stage 3 (moderate): Secondary | ICD-10-CM | POA: Diagnosis not present

## 2016-02-26 DIAGNOSIS — E43 Unspecified severe protein-calorie malnutrition: Secondary | ICD-10-CM | POA: Diagnosis not present

## 2016-02-26 DIAGNOSIS — I10 Essential (primary) hypertension: Secondary | ICD-10-CM | POA: Diagnosis not present

## 2016-02-26 DIAGNOSIS — Z7984 Long term (current) use of oral hypoglycemic drugs: Secondary | ICD-10-CM | POA: Diagnosis not present

## 2016-02-26 DIAGNOSIS — E1122 Type 2 diabetes mellitus with diabetic chronic kidney disease: Secondary | ICD-10-CM | POA: Diagnosis not present

## 2016-02-26 DIAGNOSIS — C3491 Malignant neoplasm of unspecified part of right bronchus or lung: Secondary | ICD-10-CM | POA: Diagnosis not present

## 2016-02-26 DIAGNOSIS — D6481 Anemia due to antineoplastic chemotherapy: Secondary | ICD-10-CM | POA: Diagnosis not present

## 2016-02-26 DIAGNOSIS — J449 Chronic obstructive pulmonary disease, unspecified: Secondary | ICD-10-CM | POA: Diagnosis not present

## 2016-03-02 ENCOUNTER — Inpatient Hospital Stay: Payer: Medicare Other

## 2016-03-02 DIAGNOSIS — C3411 Malignant neoplasm of upper lobe, right bronchus or lung: Secondary | ICD-10-CM

## 2016-03-02 DIAGNOSIS — R112 Nausea with vomiting, unspecified: Secondary | ICD-10-CM | POA: Diagnosis not present

## 2016-03-02 DIAGNOSIS — E1165 Type 2 diabetes mellitus with hyperglycemia: Secondary | ICD-10-CM | POA: Diagnosis not present

## 2016-03-02 DIAGNOSIS — D696 Thrombocytopenia, unspecified: Secondary | ICD-10-CM | POA: Diagnosis not present

## 2016-03-02 DIAGNOSIS — Z801 Family history of malignant neoplasm of trachea, bronchus and lung: Secondary | ICD-10-CM | POA: Diagnosis not present

## 2016-03-02 DIAGNOSIS — Z7689 Persons encountering health services in other specified circumstances: Secondary | ICD-10-CM | POA: Diagnosis not present

## 2016-03-02 DIAGNOSIS — Z7984 Long term (current) use of oral hypoglycemic drugs: Secondary | ICD-10-CM | POA: Diagnosis not present

## 2016-03-02 DIAGNOSIS — R5383 Other fatigue: Secondary | ICD-10-CM | POA: Diagnosis not present

## 2016-03-02 DIAGNOSIS — R531 Weakness: Secondary | ICD-10-CM | POA: Diagnosis not present

## 2016-03-02 DIAGNOSIS — D709 Neutropenia, unspecified: Secondary | ICD-10-CM | POA: Diagnosis not present

## 2016-03-02 DIAGNOSIS — Z8673 Personal history of transient ischemic attack (TIA), and cerebral infarction without residual deficits: Secondary | ICD-10-CM | POA: Diagnosis not present

## 2016-03-02 DIAGNOSIS — R0602 Shortness of breath: Secondary | ICD-10-CM | POA: Diagnosis not present

## 2016-03-02 DIAGNOSIS — J90 Pleural effusion, not elsewhere classified: Secondary | ICD-10-CM | POA: Diagnosis not present

## 2016-03-02 DIAGNOSIS — R05 Cough: Secondary | ICD-10-CM | POA: Diagnosis not present

## 2016-03-02 DIAGNOSIS — E785 Hyperlipidemia, unspecified: Secondary | ICD-10-CM | POA: Diagnosis not present

## 2016-03-02 DIAGNOSIS — I2510 Atherosclerotic heart disease of native coronary artery without angina pectoris: Secondary | ICD-10-CM | POA: Diagnosis not present

## 2016-03-02 DIAGNOSIS — I10 Essential (primary) hypertension: Secondary | ICD-10-CM | POA: Diagnosis not present

## 2016-03-02 DIAGNOSIS — J449 Chronic obstructive pulmonary disease, unspecified: Secondary | ICD-10-CM | POA: Diagnosis not present

## 2016-03-02 DIAGNOSIS — Z79899 Other long term (current) drug therapy: Secondary | ICD-10-CM | POA: Diagnosis not present

## 2016-03-02 DIAGNOSIS — D649 Anemia, unspecified: Secondary | ICD-10-CM | POA: Diagnosis not present

## 2016-03-02 DIAGNOSIS — Z87891 Personal history of nicotine dependence: Secondary | ICD-10-CM | POA: Diagnosis not present

## 2016-03-02 LAB — CBC WITH DIFFERENTIAL/PLATELET
Basophils Absolute: 0 10*3/uL (ref 0–0.1)
Basophils Relative: 1 %
EOS ABS: 0 10*3/uL (ref 0–0.7)
EOS PCT: 0 %
HCT: 23.5 % — ABNORMAL LOW (ref 35.0–47.0)
Hemoglobin: 8.1 g/dL — ABNORMAL LOW (ref 12.0–16.0)
LYMPHS ABS: 1 10*3/uL (ref 1.0–3.6)
LYMPHS PCT: 21 %
MCH: 32.8 pg (ref 26.0–34.0)
MCHC: 34.3 g/dL (ref 32.0–36.0)
MCV: 95.8 fL (ref 80.0–100.0)
MONOS PCT: 15 %
Monocytes Absolute: 0.8 10*3/uL (ref 0.2–0.9)
Neutro Abs: 3.1 10*3/uL (ref 1.4–6.5)
Neutrophils Relative %: 63 %
PLATELETS: 362 10*3/uL (ref 150–440)
RBC: 2.46 MIL/uL — AB (ref 3.80–5.20)
RDW: 19.5 % — ABNORMAL HIGH (ref 11.5–14.5)
WBC: 5 10*3/uL (ref 3.6–11.0)

## 2016-03-02 LAB — COMPREHENSIVE METABOLIC PANEL
ALK PHOS: 67 U/L (ref 38–126)
ALT: 11 U/L — AB (ref 14–54)
ANION GAP: 10 (ref 5–15)
AST: 13 U/L — ABNORMAL LOW (ref 15–41)
Albumin: 3.7 g/dL (ref 3.5–5.0)
BUN: 37 mg/dL — ABNORMAL HIGH (ref 6–20)
CALCIUM: 9.1 mg/dL (ref 8.9–10.3)
CHLORIDE: 100 mmol/L — AB (ref 101–111)
CO2: 28 mmol/L (ref 22–32)
CREATININE: 1.7 mg/dL — AB (ref 0.44–1.00)
GFR, EST AFRICAN AMERICAN: 33 mL/min — AB (ref >60)
GFR, EST NON AFRICAN AMERICAN: 29 mL/min — AB (ref >60)
Glucose, Bld: 181 mg/dL — ABNORMAL HIGH (ref 65–99)
Potassium: 5.3 mmol/L — ABNORMAL HIGH (ref 3.5–5.1)
SODIUM: 138 mmol/L (ref 135–145)
Total Bilirubin: 0.4 mg/dL (ref 0.3–1.2)
Total Protein: 7.3 g/dL (ref 6.5–8.1)

## 2016-03-03 DIAGNOSIS — E43 Unspecified severe protein-calorie malnutrition: Secondary | ICD-10-CM | POA: Diagnosis not present

## 2016-03-03 DIAGNOSIS — Z7984 Long term (current) use of oral hypoglycemic drugs: Secondary | ICD-10-CM | POA: Diagnosis not present

## 2016-03-03 DIAGNOSIS — N183 Chronic kidney disease, stage 3 (moderate): Secondary | ICD-10-CM | POA: Diagnosis not present

## 2016-03-03 DIAGNOSIS — I10 Essential (primary) hypertension: Secondary | ICD-10-CM | POA: Diagnosis not present

## 2016-03-03 DIAGNOSIS — C3491 Malignant neoplasm of unspecified part of right bronchus or lung: Secondary | ICD-10-CM | POA: Diagnosis not present

## 2016-03-03 DIAGNOSIS — J449 Chronic obstructive pulmonary disease, unspecified: Secondary | ICD-10-CM | POA: Diagnosis not present

## 2016-03-03 DIAGNOSIS — E1122 Type 2 diabetes mellitus with diabetic chronic kidney disease: Secondary | ICD-10-CM | POA: Diagnosis not present

## 2016-03-03 DIAGNOSIS — D6481 Anemia due to antineoplastic chemotherapy: Secondary | ICD-10-CM | POA: Diagnosis not present

## 2016-03-05 DIAGNOSIS — I10 Essential (primary) hypertension: Secondary | ICD-10-CM | POA: Diagnosis not present

## 2016-03-05 DIAGNOSIS — D6481 Anemia due to antineoplastic chemotherapy: Secondary | ICD-10-CM | POA: Diagnosis not present

## 2016-03-05 DIAGNOSIS — C3491 Malignant neoplasm of unspecified part of right bronchus or lung: Secondary | ICD-10-CM | POA: Diagnosis not present

## 2016-03-05 DIAGNOSIS — E1122 Type 2 diabetes mellitus with diabetic chronic kidney disease: Secondary | ICD-10-CM | POA: Diagnosis not present

## 2016-03-05 DIAGNOSIS — E43 Unspecified severe protein-calorie malnutrition: Secondary | ICD-10-CM | POA: Diagnosis not present

## 2016-03-05 DIAGNOSIS — J449 Chronic obstructive pulmonary disease, unspecified: Secondary | ICD-10-CM | POA: Diagnosis not present

## 2016-03-05 DIAGNOSIS — Z7984 Long term (current) use of oral hypoglycemic drugs: Secondary | ICD-10-CM | POA: Diagnosis not present

## 2016-03-05 DIAGNOSIS — N183 Chronic kidney disease, stage 3 (moderate): Secondary | ICD-10-CM | POA: Diagnosis not present

## 2016-03-06 DIAGNOSIS — J449 Chronic obstructive pulmonary disease, unspecified: Secondary | ICD-10-CM | POA: Diagnosis not present

## 2016-03-06 DIAGNOSIS — C3491 Malignant neoplasm of unspecified part of right bronchus or lung: Secondary | ICD-10-CM | POA: Diagnosis not present

## 2016-03-06 DIAGNOSIS — I10 Essential (primary) hypertension: Secondary | ICD-10-CM | POA: Diagnosis not present

## 2016-03-06 DIAGNOSIS — E43 Unspecified severe protein-calorie malnutrition: Secondary | ICD-10-CM | POA: Diagnosis not present

## 2016-03-06 DIAGNOSIS — E1122 Type 2 diabetes mellitus with diabetic chronic kidney disease: Secondary | ICD-10-CM | POA: Diagnosis not present

## 2016-03-06 DIAGNOSIS — N183 Chronic kidney disease, stage 3 (moderate): Secondary | ICD-10-CM | POA: Diagnosis not present

## 2016-03-06 DIAGNOSIS — D6481 Anemia due to antineoplastic chemotherapy: Secondary | ICD-10-CM | POA: Diagnosis not present

## 2016-03-06 DIAGNOSIS — Z7984 Long term (current) use of oral hypoglycemic drugs: Secondary | ICD-10-CM | POA: Diagnosis not present

## 2016-03-10 DIAGNOSIS — Z7984 Long term (current) use of oral hypoglycemic drugs: Secondary | ICD-10-CM | POA: Diagnosis not present

## 2016-03-10 DIAGNOSIS — E43 Unspecified severe protein-calorie malnutrition: Secondary | ICD-10-CM | POA: Diagnosis not present

## 2016-03-10 DIAGNOSIS — C3491 Malignant neoplasm of unspecified part of right bronchus or lung: Secondary | ICD-10-CM | POA: Diagnosis not present

## 2016-03-10 DIAGNOSIS — E1122 Type 2 diabetes mellitus with diabetic chronic kidney disease: Secondary | ICD-10-CM | POA: Diagnosis not present

## 2016-03-10 DIAGNOSIS — D6481 Anemia due to antineoplastic chemotherapy: Secondary | ICD-10-CM | POA: Diagnosis not present

## 2016-03-10 DIAGNOSIS — J449 Chronic obstructive pulmonary disease, unspecified: Secondary | ICD-10-CM | POA: Diagnosis not present

## 2016-03-10 DIAGNOSIS — N183 Chronic kidney disease, stage 3 (moderate): Secondary | ICD-10-CM | POA: Diagnosis not present

## 2016-03-10 DIAGNOSIS — I10 Essential (primary) hypertension: Secondary | ICD-10-CM | POA: Diagnosis not present

## 2016-03-11 ENCOUNTER — Ambulatory Visit
Admission: RE | Admit: 2016-03-11 | Discharge: 2016-03-11 | Disposition: A | Discharge status: Home / Self Care | Payer: Medicare Other | Source: Ambulatory Visit | Attending: Radiation Oncology | Admitting: Radiation Oncology

## 2016-03-11 DIAGNOSIS — C7931 Secondary malignant neoplasm of brain: Secondary | ICD-10-CM | POA: Insufficient documentation

## 2016-03-11 DIAGNOSIS — Z510 Encounter for antineoplastic radiation therapy: Secondary | ICD-10-CM | POA: Insufficient documentation

## 2016-03-11 DIAGNOSIS — Z923 Personal history of irradiation: Secondary | ICD-10-CM | POA: Insufficient documentation

## 2016-03-11 DIAGNOSIS — C3411 Malignant neoplasm of upper lobe, right bronchus or lung: Secondary | ICD-10-CM

## 2016-03-11 DIAGNOSIS — Z87891 Personal history of nicotine dependence: Secondary | ICD-10-CM | POA: Insufficient documentation

## 2016-03-11 DIAGNOSIS — Z85118 Personal history of other malignant neoplasm of bronchus and lung: Secondary | ICD-10-CM | POA: Insufficient documentation

## 2016-03-11 NOTE — Progress Notes (Signed)
Radiation Oncology Follow up Note  Name: [name] [name]   Date:   03/11/2016 MRN:  [id number] DOB: 1941-01-19    This 75 y.o. female presents to the clinic today for one-month follow-up status post radiation therapy for. Limited stage small cell lung cancer stage IIIa  REFERRING PROVIDER: Jerrol Banana.,*  HPI: Patient is now 1 month out of completing radiation therapy with concurrent chemotherapy for limited stage IIIa small cell lung cancer.. She is had significant problems with treatment including neutropenia thrombocytopenia an extensive hospitalization status post cisplatin and etoposide treatment. She is seen today in routine follow-up is doing fairly well. She specifically denies cough hemoptysis or chest tightness she has been having some peripheral edema in her lower extremities. She is scheduled for repeat CT scan next week.  COMPLICATIONS OF TREATMENT: none  FOLLOW UP COMPLIANCE: keeps appointments   PHYSICAL EXAM:  There were no vitals taken for this visit. A well-developed elderly female in NAD. Trace bipedal lower extremity edema is noted. Well-developed well-nourished patient in NAD. HEENT reveals PERLA, EOMI, discs not visualized.  Oral cavity is clear. No oral mucosal lesions are identified. Neck is clear without evidence of cervical or supraclavicular adenopathy. Lungs are clear to A&P. Cardiac examination is essentially unremarkable with regular rate and rhythm without murmur rub or thrill. Abdomen is benign with no organomegaly or masses noted. Motor sensory and DTR levels are equal and symmetric in the upper and lower extremities. Cranial nerves II through XII are grossly intact. Proprioception is intact. No peripheral adenopathy or edema is identified. No motor or sensory levels are noted. Crude visual fields are within normal range.  RADIOLOGY RESULTS: I have requested his CT scans to be reviewed after they're performed next week.  PLAN: At this time I'll review  her CT scan question would be whether to offer prophylactic cranial irradiation at this point based on the patient's age overall medical status and multiple comorbidities would probably offer to continue to observe at this time. Should she have a complete response in the chest will discuss with medical oncology my recommendations and may revisit the situation at a later date. At this time I've asked to see her back in 3-4 months. At that time discussion of brain radiation can be again entertained if she is clinically improved. Patient and husband both seem to compress my treatment plan well.  I would like to take this opportunity to thank you for allowing me to participate in the care of your patient.Armstead Peaks., MD

## 2016-03-17 ENCOUNTER — Ambulatory Visit
Admission: RE | Admit: 2016-03-17 | Discharge: 2016-03-17 | Disposition: A | Discharge status: Home / Self Care | Payer: Medicare Other | Source: Ambulatory Visit | Attending: Oncology | Admitting: Oncology

## 2016-03-17 DIAGNOSIS — C3491 Malignant neoplasm of unspecified part of right bronchus or lung: Secondary | ICD-10-CM | POA: Diagnosis not present

## 2016-03-17 DIAGNOSIS — N183 Chronic kidney disease, stage 3 (moderate): Secondary | ICD-10-CM | POA: Diagnosis not present

## 2016-03-17 DIAGNOSIS — C3411 Malignant neoplasm of upper lobe, right bronchus or lung: Secondary | ICD-10-CM | POA: Insufficient documentation

## 2016-03-17 DIAGNOSIS — E1122 Type 2 diabetes mellitus with diabetic chronic kidney disease: Secondary | ICD-10-CM | POA: Diagnosis not present

## 2016-03-17 DIAGNOSIS — I10 Essential (primary) hypertension: Secondary | ICD-10-CM | POA: Diagnosis not present

## 2016-03-17 DIAGNOSIS — C3490 Malignant neoplasm of unspecified part of unspecified bronchus or lung: Secondary | ICD-10-CM | POA: Diagnosis not present

## 2016-03-17 DIAGNOSIS — E43 Unspecified severe protein-calorie malnutrition: Secondary | ICD-10-CM | POA: Diagnosis not present

## 2016-03-17 DIAGNOSIS — Z7984 Long term (current) use of oral hypoglycemic drugs: Secondary | ICD-10-CM | POA: Diagnosis not present

## 2016-03-17 DIAGNOSIS — D6481 Anemia due to antineoplastic chemotherapy: Secondary | ICD-10-CM | POA: Diagnosis not present

## 2016-03-17 DIAGNOSIS — J449 Chronic obstructive pulmonary disease, unspecified: Secondary | ICD-10-CM | POA: Diagnosis not present

## 2016-03-17 LAB — GLUCOSE, CAPILLARY: GLUCOSE-CAPILLARY: 157 mg/dL — AB (ref 65–99)

## 2016-03-17 MED ORDER — FLUDEOXYGLUCOSE F - 18 (FDG) INJECTION
12.7800 | Freq: Once | INTRAVENOUS | Status: AC | PRN
  Administered 2016-03-17: 12.78 via INTRAVENOUS

## 2016-03-18 NOTE — Progress Notes (Signed)
[place]  [phone]) [phone] [phone]) [phone]  ID: [name] [name] OB: [birth year]  MR#: [id number]  QIO#:[id number]  Patient Care Team: Jerrol Banana., MD as PCP - General (Unknown Physician Specialty) [name], MD as Consulting Physician (Cardiology) Algernon Huxley, MD as Consulting Physician (Vascular Surgery)  CHIEF COMPLAINT: Clinical stage IIIa small cell lung carcinoma of the right upper lobe lung.  INTERVAL HISTORY: Patient returns to clinic today for further evaluation and discussion of her imaging results. She currently feels well and is nearly back to her baseline. She has no neurologic complaints. She denies any fevers. She has no chest pain, but continues to have chronic shortness of breath and cough. She has a good appetite and denies weight loss. She denies any nausea, vomiting, constipation, or diarrhea. She has no urinary complaints. Patient offers no specific complaints today.  REVIEW OF SYSTEMS:   Review of Systems  Constitutional: Negative for fever, malaise/fatigue and weight loss.  Respiratory: Positive for cough and shortness of breath.   Cardiovascular: Negative.  Negative for chest pain and leg swelling.  Gastrointestinal: Negative for abdominal pain, melena, nausea and vomiting.  Genitourinary: Negative.   Musculoskeletal: Negative.   Neurological: Negative for sensory change and weakness.  Psychiatric/Behavioral: Negative.  The patient is not nervous/anxious.     As per HPI. Otherwise, a complete review of systems is negative.  PAST MEDICAL HISTORY: Past Medical History:  Diagnosis Date  . Abnormal CT lung screening 10/17/2015  . COPD (chronic obstructive pulmonary disease) ([place])   . Coronary artery disease, non-occlusive    a. cath 2006: min nonobs CAD; b. cath 12/2010: cath LAD 50%, RCA 60%; c. 08/2013: Minimal luminal irregs, right dominant system with no significant CAD, diffuse luminal irregs noted. Normal EF 55%,  no AS or MS.   . Diabetes mellitus   . Hyperlipemia    Followed by Dr. Rosanna Randy  . Hypertension   . Lung cancer ([place])   . Macular degeneration    rt  . Personal history of tobacco use, presenting hazards to health 10/15/2015  . Pneumonia    hx  . Shortness of breath   . Stroke [facility])     PAST SURGICAL HISTORY: Past Surgical History:  Procedure Laterality Date  . ABDOMINAL HYSTERECTOMY    . ANTERIOR CERVICAL DECOMP/DISCECTOMY FUSION N/A 10/19/2013   Procedure: CERVICAL FIVE-SIX ANTERIOR CERVICAL DECOMPRESSION WITH FUSION INTERBODY PROSTHESIS PLATING AND PEEK CAGE;  Surgeon: Ophelia Charter, MD;  Location: [place] [facility]UR[facility] ORS;  Service: Neurosurgery;  Laterality: N/A;  . BACK SURGERY  80's  . BREAST CYST EXCISION Left    left negative   . CARDIAC CATHETERIZATION  05/2004  . CATARACT EXTRACTION Left   . CHOLECYSTECTOMY    . ENDARTERECTOMY Left 10/24/2014   Procedure: ENDARTERECTOMY CAROTID;  Surgeon: Algernon Huxley, MD;  Location: [facility] ORS;  Service: Vascular;  Laterality: Left;  . ENDOBRONCHIAL ULTRASOUND N/A 11/14/2015   Procedure: ENDOBRONCHIAL ULTRASOUND;  Surgeon: [name], MD;  Location: [facility] ORS;  Service: Cardiopulmonary;  Laterality: N/A;  . PERIPHERAL VASCULAR CATHETERIZATION N/A 12/04/2015   Procedure: Glori Luis Cath Insertion;  Surgeon: Algernon Huxley, MD;  Location: [place] CV LAB;  Service: Cardiovascular;  Laterality: N/A;  . TONSILLECTOMY AND ADENOIDECTOMY    . VESICOVAGINAL FISTULA CLOSURE W/ TAH      FAMILY HISTORY: Family History  Problem Relation Age of Onset  . Stroke Mother     Massive  . Heart attack Father  Massive  . Heart attack Brother   . Heart attack Brother   . Lung cancer Maternal Grandfather   . Heart attack Paternal Grandmother     MI       ADVANCED DIRECTIVES (Y/N):  N   HEALTH MAINTENANCE: Social History  Substance Use Topics  . Smoking status: Former Smoker    Packs/day: 1.00    Years: 50.00    Types: Cigarettes    Quit date:  10/03/2015  . Smokeless tobacco: Never Used     Comment: smokes 3 cigs daily 05/06/15. Pt instructed to quit.  . Alcohol use No     Colonoscopy:  PAP:  Bone density:  Lipid panel:  Allergies  Allergen Reactions  . Coconut Fatty Acids Swelling    Throat swells    Current Outpatient Prescriptions  Medication Sig Dispense Refill  . albuterol (PROVENTIL) (5 MG/ML) 0.5% nebulizer solution Take 2.5 mg by nebulization 2 (two) times daily.    Marland Kitchen atorvastatin (LIPITOR) 20 MG tablet Take 1 tablet by mouth at  bedtime 90 tablet 3  . budesonide-formoterol (SYMBICORT) 160-4.5 MCG/ACT inhaler Inhale 2 puffs into the lungs 2 (two) times daily. 1 Inhaler 0  . diazepam (VALIUM) 5 MG tablet Take 1 tablet (5 mg total) by mouth every 6 (six) hours as needed for muscle spasms. 50 tablet 1  . gabapentin (NEURONTIN) 600 MG tablet Take 1 tablet by mouth two  times daily 180 tablet 3  . glimepiride (AMARYL) 2 MG tablet Take 1 tablet (2 mg total) by mouth 2 (two) times daily. 60 tablet 2  . isosorbide mononitrate (IMDUR) 30 MG 24 hr tablet Take 1 tablet by mouth  daily 90 tablet 3  . lidocaine-prilocaine (EMLA) cream Apply to port 1-2 hours prior to chemotherapy appointment. Cover with plastic wrap. 90 g 3  . magnesium oxide (MAG-OX) 400 (241.3 MG) MG tablet Take 1 tablet by mouth 2 (two) times daily.    . metFORMIN (GLUCOPHAGE) 1000 MG tablet Take 1 tablet by mouth  every day 90 tablet 3  . metoCLOPramide (REGLAN) 5 MG/5ML solution Take 10 mLs (10 mg total) by mouth 4 (four) times daily -  before meals and at bedtime. 120 mL 0  . metoprolol tartrate (LOPRESSOR) 25 MG tablet Take 1 tablet (25 mg total) by mouth 2 (two) times daily. 60 tablet 2  . Multiple Vitamins-Minerals (ICAPS) CAPS Take 2 capsules by mouth daily. Reported on 04/17/2015    . nitroGLYCERIN (NITROSTAT) 0.4 MG SL tablet Place 1 tablet (0.4 mg total) under the tongue every 5 (five) minutes as needed for chest pain. 25 tablet 3  . ondansetron  (ZOFRAN) 8 MG tablet Take 1 tablet (8 mg total) by mouth every 8 (eight) hours as needed for nausea or vomiting. 30 tablet 1  . pantoprazole (PROTONIX) 40 MG tablet Take 1 tablet by mouth  daily 90 tablet 3  . pioglitazone (ACTOS) 30 MG tablet Take 1 tablet (30 mg total) by mouth daily. 90 tablet 3  . potassium chloride (K-DUR) 10 MEQ tablet Take 3 tablets by mouth  daily as directed (Patient taking differently: Take 1 tablet by mouth 3 times daily) 270 tablet 1  . prochlorperazine (COMPAZINE) 10 MG tablet Take 1 tablet (10 mg total) by mouth every 6 (six) hours as needed (Nausea or vomiting). 30 tablet 1   No current facility-administered medications for this visit.    Facility-Administered Medications Ordered in Other Visits  Medication Dose Route Frequency Provider Last Rate Last Dose  .  ondansetron (ZOFRAN) 8 mg, dexamethasone (DECADRON) 10 mg in sodium chloride 0.9 % 50 mL IVPB   Intravenous Once Lloyd Huger, MD        OBJECTIVE: Vitals:   03/19/16 0956  BP: 127/66  Pulse: 69  Resp: 18  Temp: 97.2 F (36.2 C)     Body mass index is 27.61 kg/m.    ECOG FS:1 - Symptomatic but completely ambulatory  General: Well-developed, well-nourished, no acute distress. Eyes: Pink conjunctiva, anicteric sclera. Lungs: Clear to auscultation bilaterally. Heart: Regular rate and rhythm. No rubs, murmurs, or gallops. Abdomen: Soft, nontender, nondistended. No organomegaly noted, normoactive bowel sounds. Musculoskeletal: No edema, cyanosis, or clubbing. Neuro: Alert, answering all questions appropriately. Cranial nerves grossly intact. Skin: No rashes or petechiae noted. Psych: Normal affect.   LAB RESULTS:  Lab Results  Component Value Date   NA 138 03/02/2016   K 5.3 (H) 03/02/2016   CL 100 (L) 03/02/2016   CO2 28 03/02/2016   GLUCOSE 181 (H) 03/02/2016   BUN 37 (H) 03/02/2016   CREATININE 1.70 (H) 03/02/2016   CALCIUM 9.1 03/02/2016   PROT 7.3 03/02/2016   ALBUMIN 3.7  03/02/2016   AST 13 (L) 03/02/2016   ALT 11 (L) 03/02/2016   ALKPHOS 67 03/02/2016   BILITOT 0.4 03/02/2016   GFRNONAA 29 (L) 03/02/2016   GFRAA 33 (L) 03/02/2016    Lab Results  Component Value Date   WBC 5.0 03/02/2016   NEUTROABS 3.1 03/02/2016   HGB 8.1 (L) 03/02/2016   HCT 23.5 (L) 03/02/2016   MCV 95.8 03/02/2016   PLT 362 03/02/2016     STUDIES: Nm Pet Image Restag (ps) Skull Base To Thigh  Result Date: 03/17/2016 CLINICAL DATA:  Subsequent treatment strategy for lung carcinoma. EXAM: NUCLEAR MEDICINE PET SKULL BASE TO THIGH TECHNIQUE: 12.8 mCi F-18 FDG was injected intravenously. Full-ring PET imaging was performed from the skull base to thigh after the radiotracer. CT data was obtained and used for attenuation correction and anatomic localization. FASTING BLOOD GLUCOSE:  Value: 157 mg/dl COMPARISON:  PET-CT 10/23/2015 FINDINGS: NECK No hypermetabolic lymph nodes in the neck. CHEST No hypermetabolic mediastinal or hilar nodes. No suspicious pulmonary nodules on the CT scan. Extensive centrilobular emphysema in the upper lobes. Mild subpleural thickening measuring 6 mm site of prior hypermetabolic nodule. No associated metabolic activity. Interval resolution of the hypermetabolic mediastinal adenopathy. No new nodules or adenopathy ABDOMEN/PELVIS Large LEFT adrenal adenoma again demonstrated. No evidence liver metastasis. No upper abdominal hypermetabolic adenopathy no pelvic adenopathy. Atherosclerotic calcification of the aorta. SKELETON No focal hypermetabolic activity to suggest skeletal metastasis. IMPRESSION: 1. No evidence lung cancer recurrence or metastasis Electronically Signed   By: Suzy Bouchard M.D.   On: 03/17/2016 15:41    ASSESSMENT: Clinical stage IIIa small cell lung carcinoma of the right upper lobe lung.  PLAN:    1. Clinical stage IIIa small cell lung carcinoma of the right upper lobe lung: PET scan results reviewed independently and reported as above  with no obvious evidence of persistent or recurrent disease. MRI the brain is negative. Patient completed cycle 3 of cisplatin and etoposide on October 24-26. Given patient's extensive hospitalization after cycle 3, treatment was discontinued. No further intervention is needed at this time. Patient was given a referral to radiation oncology for consideration of prophylactic cranial irradiation. Return to clinic in 3 months with repeat imaging using CT scan and further evaluation. 2. Neutropenia: Resolved.  3. Thrombocytopenia: Resolved. 4. Anemia: Decreased, but stable.  Patient also required blood transfusion on the hospital. No further intervention is needed. Monitor. 5. Cough: Continue OTC treatments as needed. 6. Hyperglycemia: Elevated but improved. Monitor.   Patient expressed understanding and was in agreement with this plan. She also understands that She can call clinic at any time with any questions, concerns, or complaints.   Primary cancer of right upper lobe of lung [facility])   Staging form: Lung, AJCC 7th Edition   - Clinical stage from 11/26/2015: Stage IIIA (T1a, N2, M0) - Signed by Lloyd Huger, MD on 11/26/2015  Lloyd Huger, MD   03/22/2016 8:21 AM

## 2016-03-19 ENCOUNTER — Inpatient Hospital Stay: Payer: Medicare Other | Attending: Oncology | Admitting: Oncology

## 2016-03-19 VITALS — BP 127/66 | HR 69 | Temp 97.2°F | Resp 18 | Wt 160.8 lb

## 2016-03-19 DIAGNOSIS — I2510 Atherosclerotic heart disease of native coronary artery without angina pectoris: Secondary | ICD-10-CM

## 2016-03-19 DIAGNOSIS — R05 Cough: Secondary | ICD-10-CM | POA: Insufficient documentation

## 2016-03-19 DIAGNOSIS — J449 Chronic obstructive pulmonary disease, unspecified: Secondary | ICD-10-CM

## 2016-03-19 DIAGNOSIS — C3411 Malignant neoplasm of upper lobe, right bronchus or lung: Secondary | ICD-10-CM

## 2016-03-19 DIAGNOSIS — E785 Hyperlipidemia, unspecified: Secondary | ICD-10-CM | POA: Diagnosis not present

## 2016-03-19 DIAGNOSIS — Z8673 Personal history of transient ischemic attack (TIA), and cerebral infarction without residual deficits: Secondary | ICD-10-CM | POA: Diagnosis not present

## 2016-03-19 DIAGNOSIS — Z87891 Personal history of nicotine dependence: Secondary | ICD-10-CM

## 2016-03-19 DIAGNOSIS — D649 Anemia, unspecified: Secondary | ICD-10-CM | POA: Diagnosis not present

## 2016-03-19 DIAGNOSIS — I10 Essential (primary) hypertension: Secondary | ICD-10-CM | POA: Diagnosis not present

## 2016-03-19 DIAGNOSIS — Z79899 Other long term (current) drug therapy: Secondary | ICD-10-CM | POA: Diagnosis not present

## 2016-03-19 DIAGNOSIS — Z8701 Personal history of pneumonia (recurrent): Secondary | ICD-10-CM | POA: Diagnosis not present

## 2016-03-19 DIAGNOSIS — Z7984 Long term (current) use of oral hypoglycemic drugs: Secondary | ICD-10-CM | POA: Diagnosis not present

## 2016-03-19 DIAGNOSIS — Z7951 Long term (current) use of inhaled steroids: Secondary | ICD-10-CM | POA: Diagnosis not present

## 2016-03-19 DIAGNOSIS — E1165 Type 2 diabetes mellitus with hyperglycemia: Secondary | ICD-10-CM | POA: Diagnosis not present

## 2016-03-19 DIAGNOSIS — Z23 Encounter for immunization: Secondary | ICD-10-CM | POA: Insufficient documentation

## 2016-03-19 DIAGNOSIS — R0602 Shortness of breath: Secondary | ICD-10-CM | POA: Diagnosis not present

## 2016-03-19 DIAGNOSIS — Z85118 Personal history of other malignant neoplasm of bronchus and lung: Secondary | ICD-10-CM | POA: Diagnosis not present

## 2016-03-19 DIAGNOSIS — I700 Atherosclerosis of aorta: Secondary | ICD-10-CM

## 2016-03-19 MED ORDER — INFLUENZA VAC SPLIT QUAD 0.5 ML IM SUSY
0.5000 mL | PREFILLED_SYRINGE | Freq: Once | INTRAMUSCULAR | Status: AC
  Administered 2016-03-19: 0.5 mL via INTRAMUSCULAR
  Filled 2016-03-19: qty 0.5

## 2016-03-19 NOTE — Progress Notes (Signed)
Patient is here for follow up she is doing well, she does mention feet are swelling.

## 2016-03-20 DIAGNOSIS — E43 Unspecified severe protein-calorie malnutrition: Secondary | ICD-10-CM | POA: Diagnosis not present

## 2016-03-20 DIAGNOSIS — Z7984 Long term (current) use of oral hypoglycemic drugs: Secondary | ICD-10-CM | POA: Diagnosis not present

## 2016-03-20 DIAGNOSIS — C3491 Malignant neoplasm of unspecified part of right bronchus or lung: Secondary | ICD-10-CM | POA: Diagnosis not present

## 2016-03-20 DIAGNOSIS — E1122 Type 2 diabetes mellitus with diabetic chronic kidney disease: Secondary | ICD-10-CM | POA: Diagnosis not present

## 2016-03-20 DIAGNOSIS — D6481 Anemia due to antineoplastic chemotherapy: Secondary | ICD-10-CM | POA: Diagnosis not present

## 2016-03-20 DIAGNOSIS — J449 Chronic obstructive pulmonary disease, unspecified: Secondary | ICD-10-CM | POA: Diagnosis not present

## 2016-03-20 DIAGNOSIS — N183 Chronic kidney disease, stage 3 (moderate): Secondary | ICD-10-CM | POA: Diagnosis not present

## 2016-03-20 DIAGNOSIS — I10 Essential (primary) hypertension: Secondary | ICD-10-CM | POA: Diagnosis not present

## 2016-03-25 DIAGNOSIS — R0602 Shortness of breath: Secondary | ICD-10-CM | POA: Diagnosis not present

## 2016-03-25 DIAGNOSIS — J449 Chronic obstructive pulmonary disease, unspecified: Secondary | ICD-10-CM | POA: Diagnosis not present

## 2016-03-27 DIAGNOSIS — J449 Chronic obstructive pulmonary disease, unspecified: Secondary | ICD-10-CM | POA: Diagnosis not present

## 2016-03-31 ENCOUNTER — Ambulatory Visit
Admission: RE | Admit: 2016-03-31 | Discharge: 2016-03-31 | Disposition: A | Discharge status: Home / Self Care | Payer: Medicare Other | Source: Ambulatory Visit | Attending: Radiation Oncology | Admitting: Radiation Oncology

## 2016-03-31 ENCOUNTER — Encounter: Payer: Self-pay | Admitting: Radiation Oncology

## 2016-03-31 VITALS — BP 170/72 | HR 67 | Temp 97.3°F | Wt 163.5 lb

## 2016-03-31 DIAGNOSIS — C7931 Secondary malignant neoplasm of brain: Secondary | ICD-10-CM | POA: Diagnosis not present

## 2016-03-31 DIAGNOSIS — Z85118 Personal history of other malignant neoplasm of bronchus and lung: Secondary | ICD-10-CM | POA: Diagnosis not present

## 2016-03-31 DIAGNOSIS — Z87891 Personal history of nicotine dependence: Secondary | ICD-10-CM | POA: Diagnosis not present

## 2016-03-31 DIAGNOSIS — Z923 Personal history of irradiation: Secondary | ICD-10-CM | POA: Diagnosis not present

## 2016-03-31 DIAGNOSIS — C3411 Malignant neoplasm of upper lobe, right bronchus or lung: Secondary | ICD-10-CM

## 2016-03-31 DIAGNOSIS — Z510 Encounter for antineoplastic radiation therapy: Secondary | ICD-10-CM | POA: Diagnosis not present

## 2016-03-31 NOTE — Progress Notes (Signed)
Radiation Oncology Follow up Note  Name: [name] [name]   Date:   03/31/2016 MRN:  [id number] DOB: June 27, 1940    This 75 y.o. female presents to the clinic today for consideration of whole brain radiation therapy for limited stage small cell lung cancer.  REFERRING PROVIDER: Jerrol Banana.,*  HPI: Patient is a 75 year old female now out close to 2 months having completed radiation therapy with concurrent chemotherapy for stage IIIa limited stage small cell lung cancer. She recently had a PET CT scan showing no evidence of active disease indicative of a complete response. She is doing well. She specifically denies cough hemoptysis chest tightness. She's having no neurologic problems. She seen today for consideration of whole brain radiation therapy to eradicate microscopic residual disease in the brain..  COMPLICATIONS OF TREATMENT: none  FOLLOW UP COMPLIANCE: keeps appointments   PHYSICAL EXAM:  BP (!) 170/72   Pulse 67   Temp 97.3 F (36.3 C)   Wt 163 lb 7.5 oz (74.2 kg)   BMI 28.06 kg/m  Well-developed well-nourished patient in NAD. HEENT reveals PERLA, EOMI, discs not visualized.  Oral cavity is clear. No oral mucosal lesions are identified. Neck is clear without evidence of cervical or supraclavicular adenopathy. Lungs are clear to A&P. Cardiac examination is essentially unremarkable with regular rate and rhythm without murmur rub or thrill. Abdomen is benign with no organomegaly or masses noted. Motor sensory and DTR levels are equal and symmetric in the upper and lower extremities. Cranial nerves II through XII are grossly intact. Proprioception is intact. No peripheral adenopathy or edema is identified. No motor or sensory levels are noted. Crude visual fields are within normal range.  RADIOLOGY RESULTS: PET CT scan is reviewed and compatible above-stated findings  PLAN: Present time patient is had a complete response will go ahead and deliver 3000 cGy in 15 fractions to  her whole brain. Risks and benefits of treatment including skin reaction possible alopecia fatigue alteration of blood counts slight chance of cognitive decline all were discussed in detail with the patient and her husband. Both seem to comprehend my treatment plan well. I personally set up and arranged for CT simulation next week.  I would like to take this opportunity to thank you for allowing me to participate in the care of your patient.Armstead Peaks., MD

## 2016-04-08 ENCOUNTER — Ambulatory Visit
Admission: RE | Admit: 2016-04-08 | Discharge: 2016-04-08 | Disposition: A | Discharge status: Home / Self Care | Payer: Medicare Other | Source: Ambulatory Visit | Attending: Radiation Oncology | Admitting: Radiation Oncology

## 2016-04-08 DIAGNOSIS — Z510 Encounter for antineoplastic radiation therapy: Secondary | ICD-10-CM | POA: Diagnosis not present

## 2016-04-08 DIAGNOSIS — C7931 Secondary malignant neoplasm of brain: Secondary | ICD-10-CM | POA: Diagnosis not present

## 2016-04-08 DIAGNOSIS — Z923 Personal history of irradiation: Secondary | ICD-10-CM | POA: Diagnosis not present

## 2016-04-08 DIAGNOSIS — Z87891 Personal history of nicotine dependence: Secondary | ICD-10-CM | POA: Diagnosis not present

## 2016-04-08 DIAGNOSIS — Z85118 Personal history of other malignant neoplasm of bronchus and lung: Secondary | ICD-10-CM | POA: Diagnosis not present

## 2016-04-10 ENCOUNTER — Other Ambulatory Visit: Payer: Self-pay | Admitting: Family Medicine

## 2016-04-10 DIAGNOSIS — E1342 Other specified diabetes mellitus with diabetic polyneuropathy: Secondary | ICD-10-CM

## 2016-04-13 DIAGNOSIS — C7931 Secondary malignant neoplasm of brain: Secondary | ICD-10-CM | POA: Diagnosis not present

## 2016-04-13 DIAGNOSIS — Z510 Encounter for antineoplastic radiation therapy: Secondary | ICD-10-CM | POA: Diagnosis not present

## 2016-04-13 DIAGNOSIS — Z85118 Personal history of other malignant neoplasm of bronchus and lung: Secondary | ICD-10-CM | POA: Diagnosis not present

## 2016-04-13 DIAGNOSIS — Z87891 Personal history of nicotine dependence: Secondary | ICD-10-CM | POA: Diagnosis not present

## 2016-04-13 DIAGNOSIS — Z923 Personal history of irradiation: Secondary | ICD-10-CM | POA: Diagnosis not present

## 2016-04-16 ENCOUNTER — Other Ambulatory Visit: Payer: Self-pay | Admitting: *Deleted

## 2016-04-16 DIAGNOSIS — C3411 Malignant neoplasm of upper lobe, right bronchus or lung: Secondary | ICD-10-CM

## 2016-04-16 MED ORDER — DEXAMETHASONE 4 MG PO TABS: 4.0000 mg | ORAL_TABLET | Freq: Every day | ORAL | 0 refills | Status: DC

## 2016-04-20 ENCOUNTER — Ambulatory Visit
Admission: RE | Admit: 2016-04-20 | Discharge: 2016-04-20 | Disposition: A | Discharge status: Home / Self Care | Payer: Medicare Other | Source: Ambulatory Visit | Attending: Radiation Oncology | Admitting: Radiation Oncology

## 2016-04-20 ENCOUNTER — Other Ambulatory Visit: Payer: Self-pay | Admitting: *Deleted

## 2016-04-20 DIAGNOSIS — Z87891 Personal history of nicotine dependence: Secondary | ICD-10-CM | POA: Diagnosis not present

## 2016-04-20 DIAGNOSIS — Z85118 Personal history of other malignant neoplasm of bronchus and lung: Secondary | ICD-10-CM | POA: Diagnosis not present

## 2016-04-20 DIAGNOSIS — Z923 Personal history of irradiation: Secondary | ICD-10-CM | POA: Diagnosis not present

## 2016-04-20 DIAGNOSIS — Z510 Encounter for antineoplastic radiation therapy: Secondary | ICD-10-CM | POA: Diagnosis not present

## 2016-04-20 DIAGNOSIS — C7931 Secondary malignant neoplasm of brain: Secondary | ICD-10-CM | POA: Diagnosis not present

## 2016-04-21 ENCOUNTER — Ambulatory Visit
Admission: RE | Admit: 2016-04-21 | Discharge: 2016-04-21 | Disposition: A | Discharge status: Home / Self Care | Payer: Medicare Other | Source: Ambulatory Visit | Attending: Radiation Oncology | Admitting: Radiation Oncology

## 2016-04-21 DIAGNOSIS — C7931 Secondary malignant neoplasm of brain: Secondary | ICD-10-CM | POA: Diagnosis not present

## 2016-04-21 DIAGNOSIS — Z510 Encounter for antineoplastic radiation therapy: Secondary | ICD-10-CM | POA: Diagnosis not present

## 2016-04-21 DIAGNOSIS — Z85118 Personal history of other malignant neoplasm of bronchus and lung: Secondary | ICD-10-CM | POA: Diagnosis not present

## 2016-04-21 DIAGNOSIS — Z923 Personal history of irradiation: Secondary | ICD-10-CM | POA: Diagnosis not present

## 2016-04-21 DIAGNOSIS — Z87891 Personal history of nicotine dependence: Secondary | ICD-10-CM | POA: Diagnosis not present

## 2016-04-22 ENCOUNTER — Ambulatory Visit: Payer: Medicare Other

## 2016-04-23 ENCOUNTER — Ambulatory Visit: Payer: Medicare Other

## 2016-04-23 ENCOUNTER — Ambulatory Visit: Payer: Medicare Other | Admitting: Family Medicine

## 2016-04-24 ENCOUNTER — Ambulatory Visit: Payer: Medicare Other

## 2016-04-25 DIAGNOSIS — J449 Chronic obstructive pulmonary disease, unspecified: Secondary | ICD-10-CM | POA: Diagnosis not present

## 2016-04-25 DIAGNOSIS — R0602 Shortness of breath: Secondary | ICD-10-CM | POA: Diagnosis not present

## 2016-04-27 ENCOUNTER — Encounter: Payer: Self-pay | Admitting: Cardiovascular Disease

## 2016-04-27 ENCOUNTER — Ambulatory Visit (INDEPENDENT_AMBULATORY_CARE_PROVIDER_SITE_OTHER): Payer: Medicare Other | Admitting: Cardiovascular Disease

## 2016-04-27 ENCOUNTER — Inpatient Hospital Stay: Payer: Medicare Other

## 2016-04-27 ENCOUNTER — Ambulatory Visit
Admission: RE | Admit: 2016-04-27 | Discharge: 2016-04-27 | Disposition: A | Discharge status: Home / Self Care | Payer: Medicare Other | Source: Ambulatory Visit | Attending: Radiation Oncology | Admitting: Radiation Oncology

## 2016-04-27 VITALS — BP 118/60 | HR 61 | Ht 62.0 in | Wt 159.5 lb

## 2016-04-27 DIAGNOSIS — Z510 Encounter for antineoplastic radiation therapy: Secondary | ICD-10-CM | POA: Diagnosis not present

## 2016-04-27 DIAGNOSIS — E782 Mixed hyperlipidemia: Secondary | ICD-10-CM

## 2016-04-27 DIAGNOSIS — I200 Unstable angina: Secondary | ICD-10-CM

## 2016-04-27 DIAGNOSIS — C3411 Malignant neoplasm of upper lobe, right bronchus or lung: Secondary | ICD-10-CM

## 2016-04-27 DIAGNOSIS — I10 Essential (primary) hypertension: Secondary | ICD-10-CM

## 2016-04-27 DIAGNOSIS — E1159 Type 2 diabetes mellitus with other circulatory complications: Secondary | ICD-10-CM

## 2016-04-27 DIAGNOSIS — C7931 Secondary malignant neoplasm of brain: Secondary | ICD-10-CM | POA: Diagnosis not present

## 2016-04-27 DIAGNOSIS — Z923 Personal history of irradiation: Secondary | ICD-10-CM | POA: Diagnosis not present

## 2016-04-27 DIAGNOSIS — J449 Chronic obstructive pulmonary disease, unspecified: Secondary | ICD-10-CM | POA: Diagnosis not present

## 2016-04-27 DIAGNOSIS — J432 Centrilobular emphysema: Secondary | ICD-10-CM

## 2016-04-27 DIAGNOSIS — E1342 Other specified diabetes mellitus with diabetic polyneuropathy: Secondary | ICD-10-CM

## 2016-04-27 DIAGNOSIS — Z85118 Personal history of other malignant neoplasm of bronchus and lung: Secondary | ICD-10-CM | POA: Diagnosis not present

## 2016-04-27 DIAGNOSIS — F17200 Nicotine dependence, unspecified, uncomplicated: Secondary | ICD-10-CM | POA: Diagnosis not present

## 2016-04-27 DIAGNOSIS — Z87891 Personal history of nicotine dependence: Secondary | ICD-10-CM | POA: Diagnosis not present

## 2016-04-27 NOTE — Progress Notes (Signed)
Cardiology Office Note  Date:  04/27/2016   ID:  [name] [name], [name] [name] 05-29-1940, MRN [id number]  PCP:  [name], MD   Chief Complaint  Patient presents with  . other    6 month follow up. Meds reviewed by the pt. verbally. Pt. has lung cancer, had 2 chemotherapy treatments;  had a difficult time with it and now recieving radiation daily.     HPI:  [name] [name] is a 76 year old woman with a history of  COPD with ongoing tobacco use, stroke, hypertension, diabetes, CVA and hyperlipidemia. H/o chest pain with minimal nonobstructive coronary artery disease by catheterization on May 08, 2004, repeat catheterization September 2012 showing 50% LAD disease, 60% RCA disease, cardiac catheterization May 2015 showing no significant CAD who presents for follow-up of her shortness of breath, chest pain history  in the hospital October 2016 with sugars of 600 Prior history of poor control diabetes  In follow-up today we discussed her recent hospital course, cancer treatment with complications Chemo x 3, In hospital after last round 9 days, platelets, PRBC, neulasta Going through XRT, on chest and brain Finishes 05/15/2106 Tolerating radiation well, so far Weight up and down Quit smoking since June 2017 Off asa, while she was in the hospital, has not restarted Continued chronic shortness of breath on exertion  Swelling feet, better as her anemia is resolving HCT 23 in 02/2016 CR 1.7, BUN 37 Glu 157 she reports sugar levels are improved.  HBA1C 08/2014 was 7.9  no recent hemoglobin A1c that I can evaluate  Rare CP, at rest,  Takes NTG, more since on chemo, when she was anemic Still on lipitor Last total chol 186, LDL 86  EKG on today's visit shows no sinus rhythm with rate 61 bpm, no significant ST or T-wave changes   History of normal echocardiogram and stress test Hemoglobin A1c January 2017 was 10.4  Carotid ultrasound reviewed from July 2016 with her showing 50-69% stenosis  on the right, greater than 70% disease on the left. Since then she has had carotid endarterectomy on the left  Prior catheterization in 2015 showed no significant CAD. There is no evidence of previously seen moderate disease in the LAD or RCA. left arm symptoms were from musculoskeletal etiology, unable to exclude arthritis in her neck and DJD.  She has a long history of chronic shortness of breath likely secondary to COPD She continues to have problems with diabetes control. No lightheadedness, PND or orthopnea. ACE inhibitor held by pulmonary for possible cough.   previous Total cholesterol 210, LDL 130, HDL 43  PMH:   has a past medical history of Abnormal CT lung screening (10/17/2015); COPD (chronic obstructive pulmonary disease) ([place]); Coronary artery disease, non-occlusive; Diabetes mellitus; Hyperlipemia; Hypertension; Lung cancer ([place]); Macular degeneration; Personal history of tobacco use, presenting hazards to health (10/15/2015); Pneumonia; Shortness of breath; and Stroke ([place]).  PSH:    Past Surgical History:  Procedure Laterality Date  . ABDOMINAL HYSTERECTOMY    . ANTERIOR CERVICAL DECOMP/DISCECTOMY FUSION N/A 10/19/2013   Procedure: CERVICAL FIVE-SIX ANTERIOR CERVICAL DECOMPRESSION WITH FUSION INTERBODY PROSTHESIS PLATING AND PEEK CAGE;  Surgeon: Ophelia Charter, MD;  Location: [place] NE[facility] ORS;  Service: Neurosurgery;  Laterality: N/A;  . BACK SURGERY  80's  . BREAST CYST EXCISION Left    left negative   . CARDIAC CATHETERIZATION  05/2004  . CATARACT EXTRACTION Left   . CHOLECYSTECTOMY    . ENDARTERECTOMY Left 10/24/2014   Procedure: ENDARTERECTOMY CAROTID;  Surgeon:  Algernon Huxley, MD;  Location: [facility] ORS;  Service: Vascular;  Laterality: Left;  . ENDOBRONCHIAL ULTRASOUND N/A 11/14/2015   Procedure: ENDOBRONCHIAL ULTRASOUND;  Surgeon: [name], MD;  Location: [facility] ORS;  Service: Cardiopulmonary;  Laterality: N/A;  . PERIPHERAL VASCULAR CATHETERIZATION N/A 12/04/2015    Procedure: Glori Luis Cath Insertion;  Surgeon: Algernon Huxley, MD;  Location: [place] CV LAB;  Service: Cardiovascular;  Laterality: N/A;  . TONSILLECTOMY AND ADENOIDECTOMY    . VESICOVAGINAL FISTULA CLOSURE W/ TAH      Current Outpatient Prescriptions  Medication Sig Dispense Refill  . albuterol (PROVENTIL) (5 MG/ML) 0.5% nebulizer solution Take 2.5 mg by nebulization 2 (two) times daily.    Marland Kitchen atorvastatin (LIPITOR) 20 MG tablet Take 1 tablet by mouth at  bedtime 90 tablet 3  . budesonide-formoterol (SYMBICORT) 160-4.5 MCG/ACT inhaler Inhale 2 puffs into the lungs 2 (two) times daily. 1 Inhaler 0  . dexamethasone (DECADRON) 4 MG tablet Take 1 tablet (4 mg total) by mouth daily. 30 tablet 0  . diazepam (VALIUM) 5 MG tablet Take 1 tablet (5 mg total) by mouth every 6 (six) hours as needed for muscle spasms. 50 tablet 1  . gabapentin (NEURONTIN) 600 MG tablet Take 1 tablet by mouth two  times daily 180 tablet 3  . glimepiride (AMARYL) 2 MG tablet Take 1 tablet (2 mg total) by mouth 2 (two) times daily. 60 tablet 2  . isosorbide mononitrate (IMDUR) 30 MG 24 hr tablet Take 1 tablet by mouth  daily 90 tablet 3  . lidocaine-prilocaine (EMLA) cream Apply to port 1-2 hours prior to chemotherapy appointment. Cover with plastic wrap. 90 g 3  . magnesium oxide (MAG-OX) 400 (241.3 MG) MG tablet Take 1 tablet by mouth 2 (two) times daily.    . metFORMIN (GLUCOPHAGE) 1000 MG tablet Take 1 tablet by mouth  every day 90 tablet 3  . metoCLOPramide (REGLAN) 5 MG/5ML solution Take 10 mLs (10 mg total) by mouth 4 (four) times daily -  before meals and at bedtime. 120 mL 0  . metoprolol tartrate (LOPRESSOR) 25 MG tablet Take 1 tablet (25 mg total) by mouth 2 (two) times daily. 60 tablet 2  . Multiple Vitamins-Minerals (ICAPS) CAPS Take 2 capsules by mouth daily. Reported on 04/17/2015    . nitroGLYCERIN (NITROSTAT) 0.4 MG SL tablet Place 1 tablet (0.4 mg total) under the tongue every 5 (five) minutes as needed for  chest pain. 25 tablet 3  . ondansetron (ZOFRAN) 8 MG tablet Take 1 tablet (8 mg total) by mouth every 8 (eight) hours as needed for nausea or vomiting. 30 tablet 1  . pantoprazole (PROTONIX) 40 MG tablet Take 1 tablet by mouth  daily 90 tablet 3  . pioglitazone (ACTOS) 30 MG tablet TAKE 1 TABLET BY MOUTH  DAILY 90 tablet 3  . potassium chloride (K-DUR) 10 MEQ tablet Take 3 tablets by mouth  daily as directed (Patient taking differently: Take 1 tablet by mouth 3 times daily) 270 tablet 1  . prochlorperazine (COMPAZINE) 10 MG tablet Take 1 tablet (10 mg total) by mouth every 6 (six) hours as needed (Nausea or vomiting). 30 tablet 1   No current facility-administered medications for this visit.    Facility-Administered Medications Ordered in Other Visits  Medication Dose Route Frequency Provider Last Rate Last Dose  . ondansetron (ZOFRAN) 8 mg, dexamethasone (DECADRON) 10 mg in sodium chloride 0.9 % 50 mL IVPB   Intravenous Once Lloyd Huger, MD  Allergies:   Coconut fatty acids   Social History:  The patient  reports that she quit smoking about 6 months ago. Her smoking use included Cigarettes. She has a 50.00 pack-year smoking history. She has never used smokeless tobacco. She reports that she does not drink alcohol or use drugs.   Family History:   family history includes Heart attack in her brother, brother, father, and paternal grandmother; Lung cancer in her maternal grandfather; Stroke in her mother.    Review of Systems: Review of Systems  Constitutional: Negative.   Respiratory: Negative.   Cardiovascular: Negative.   Gastrointestinal: Negative.   Musculoskeletal: Negative.   Neurological: Negative.   Psychiatric/Behavioral: Negative.   All other systems reviewed and are negative.    PHYSICAL EXAM: VS:  BP 118/60 (BP Location: Left Arm, Patient Position: Sitting, Cuff Size: Normal)   Pulse 61   Ht '5\' 2"'$  (1.575 m)   Wt 159 lb 8 oz (72.3 kg)   BMI 29.17 kg/m   , BMI Body mass index is 29.17 kg/m. GEN: Well nourished, well developed, in no acute distress  HEENT: normal  Neck: no JVD, carotid bruits, or masses Cardiac: RRR; no murmurs, rubs, or gallops,no edema  Respiratory:  clear to auscultation bilaterally, normal work of breathing GI: soft, nontender, nondistended, + BS MS: no deformity or atrophy  Skin: warm and dry, no rash Neuro:  Strength and sensation are intact Psych: euthymic mood, full affect    Recent Labs: 08/19/2015: TSH 2.200 02/13/2016: Magnesium 1.8 03/02/2016: ALT 11; BUN 37; Creatinine, Ser 1.70; Hemoglobin 8.1; Platelets 362; Potassium 5.3; Sodium 138    Lipid Panel Lab Results  Component Value Date   CHOL 186 08/19/2015   HDL 55 08/19/2015   LDLCALC 86 08/19/2015   TRIG 226 (H) 08/19/2015      Wt Readings from Last 3 Encounters:  04/27/16 159 lb 8 oz (72.3 kg)  03/31/16 163 lb 7.5 oz (74.2 kg)  03/19/16 160 lb 13.2 oz (73 kg)       ASSESSMENT AND PLAN:  Mixed hyperlipidemia Encouraged her to stay on her Lipitor given coronary disease, PAD  HYPERTENSION, BENIGN - Plan: EKG 12-Lead Blood pressure is well controlled on today's visit. No changes made to the medications.  Unstable angina ([place]) - Plan: EKG 12-Lead Suspect she was having anginal symptoms in the setting of anemia, this has resolved as her blood count has improved. Has not been taking nitroglycerin for the past week Recommended if she starts to take nitroglycerin again to carry on exertion that she call our office for further testing  TOBACCO ABUSE Stop smoking June 2017  Centrilobular emphysema ([place]) Stable symptoms, on inhalers. Stop smoking 2017  Diabetic polyneuropathy associated with other specified diabetes mellitus ([place]) Will defer management to Dr. Rosanna Randy, prior history of poor control diabetes She reports they are better recently, diet has improved  Primary cancer of right upper lobe of lung ([place]) Recent events including  treatment, hospitalizations reviewed with her Profound pancytopenia contributing to shortness of breath, leg edema Required supportive care, now doing much better Completing radiation  Type 2 diabetes mellitus with other circulatory complication, unspecified long term insulin use status ([place]) We have encouraged continued exercise, careful diet management in an effort to lose weight.   Total encounter time more than 25 minutes  Greater than 50% was spent in counseling and coordination of care with the patient   Disposition:   F/U  6 months   Orders Placed This Encounter  Procedures  . EKG 12-Lead     Signed, Esmond Plants, M.D., Ph.D. 04/27/2016  [place], [place]

## 2016-04-27 NOTE — Patient Instructions (Signed)

## 2016-04-28 ENCOUNTER — Other Ambulatory Visit: Payer: Self-pay | Admitting: *Deleted

## 2016-04-28 ENCOUNTER — Inpatient Hospital Stay: Payer: Medicare Other | Attending: Radiation Oncology

## 2016-04-28 ENCOUNTER — Ambulatory Visit
Admission: RE | Admit: 2016-04-28 | Discharge: 2016-04-28 | Disposition: A | Discharge status: Home / Self Care | Payer: Medicare Other | Source: Ambulatory Visit | Attending: Radiation Oncology | Admitting: Radiation Oncology

## 2016-04-28 DIAGNOSIS — Z923 Personal history of irradiation: Secondary | ICD-10-CM | POA: Diagnosis not present

## 2016-04-28 DIAGNOSIS — C7931 Secondary malignant neoplasm of brain: Secondary | ICD-10-CM | POA: Diagnosis not present

## 2016-04-28 DIAGNOSIS — Z85118 Personal history of other malignant neoplasm of bronchus and lung: Secondary | ICD-10-CM | POA: Diagnosis not present

## 2016-04-28 DIAGNOSIS — Z87891 Personal history of nicotine dependence: Secondary | ICD-10-CM | POA: Diagnosis not present

## 2016-04-28 DIAGNOSIS — Z510 Encounter for antineoplastic radiation therapy: Secondary | ICD-10-CM | POA: Diagnosis not present

## 2016-04-28 MED ORDER — AZITHROMYCIN 250 MG PO TABS: ORAL_TABLET | ORAL | 0 refills | Status: DC

## 2016-04-29 ENCOUNTER — Ambulatory Visit
Admission: RE | Admit: 2016-04-29 | Discharge: 2016-04-29 | Disposition: A | Discharge status: Home / Self Care | Payer: Medicare Other | Source: Ambulatory Visit | Attending: Radiation Oncology | Admitting: Radiation Oncology

## 2016-04-29 DIAGNOSIS — Z510 Encounter for antineoplastic radiation therapy: Secondary | ICD-10-CM | POA: Diagnosis not present

## 2016-04-29 DIAGNOSIS — Z923 Personal history of irradiation: Secondary | ICD-10-CM | POA: Diagnosis not present

## 2016-04-29 DIAGNOSIS — Z87891 Personal history of nicotine dependence: Secondary | ICD-10-CM | POA: Diagnosis not present

## 2016-04-29 DIAGNOSIS — C7931 Secondary malignant neoplasm of brain: Secondary | ICD-10-CM | POA: Diagnosis not present

## 2016-04-29 DIAGNOSIS — Z85118 Personal history of other malignant neoplasm of bronchus and lung: Secondary | ICD-10-CM | POA: Diagnosis not present

## 2016-04-30 ENCOUNTER — Ambulatory Visit (INDEPENDENT_AMBULATORY_CARE_PROVIDER_SITE_OTHER): Payer: Medicare Other | Admitting: Family Medicine

## 2016-04-30 ENCOUNTER — Ambulatory Visit
Admission: RE | Admit: 2016-04-30 | Discharge: 2016-04-30 | Disposition: A | Discharge status: Home / Self Care | Payer: Medicare Other | Source: Ambulatory Visit | Attending: Radiation Oncology | Admitting: Radiation Oncology

## 2016-04-30 ENCOUNTER — Encounter: Payer: Self-pay | Admitting: Family Medicine

## 2016-04-30 VITALS — BP 118/60 | HR 72 | Temp 97.7°F | Resp 16 | Wt 162.0 lb

## 2016-04-30 DIAGNOSIS — Z85118 Personal history of other malignant neoplasm of bronchus and lung: Secondary | ICD-10-CM | POA: Diagnosis not present

## 2016-04-30 DIAGNOSIS — E782 Mixed hyperlipidemia: Secondary | ICD-10-CM | POA: Diagnosis not present

## 2016-04-30 DIAGNOSIS — I10 Essential (primary) hypertension: Secondary | ICD-10-CM

## 2016-04-30 DIAGNOSIS — C3411 Malignant neoplasm of upper lobe, right bronchus or lung: Secondary | ICD-10-CM

## 2016-04-30 DIAGNOSIS — Z923 Personal history of irradiation: Secondary | ICD-10-CM | POA: Diagnosis not present

## 2016-04-30 DIAGNOSIS — C7931 Secondary malignant neoplasm of brain: Secondary | ICD-10-CM | POA: Diagnosis not present

## 2016-04-30 DIAGNOSIS — E1342 Other specified diabetes mellitus with diabetic polyneuropathy: Secondary | ICD-10-CM

## 2016-04-30 DIAGNOSIS — Z87891 Personal history of nicotine dependence: Secondary | ICD-10-CM | POA: Diagnosis not present

## 2016-04-30 DIAGNOSIS — Z510 Encounter for antineoplastic radiation therapy: Secondary | ICD-10-CM | POA: Diagnosis not present

## 2016-04-30 LAB — POCT GLYCOSYLATED HEMOGLOBIN (HGB A1C): HEMOGLOBIN A1C: 8

## 2016-04-30 MED ORDER — PIOGLITAZONE HCL 30 MG PO TABS: 30.0000 mg | ORAL_TABLET | Freq: Every day | ORAL | 1 refills | Status: DC

## 2016-04-30 NOTE — Progress Notes (Signed)
Patient: [name] [name] Female    DOB: 02-14-1941   76 y.o.   MRN: [id number] Visit Date: 04/30/2016  Today's Provider: [name], MD   Chief Complaint  Patient presents with  . Hypertension  . Hyperlipidemia  . Diabetes   Subjective:    HPI   Diabetes Mellitus Type II, Follow-up:   Lab Results  Component Value Date   HGBA1C 9.8 01/09/2016   HGBA1C 10.4 05/06/2015   HGBA1C 8.4 01/01/2015   Last seen for diabetes 4 months ago.  Management since then includes None. She reports excellent compliance with treatment. She is not having side effects.  Current symptoms include none and have been stable. Home blood sugar records: 150-200's  Episodes of hypoglycemia? no  Most Recent Eye Exam: UTD Current diet: in general, a "healthy" diet   Current exercise: walking  ------------------------------------------------------------------------   Hypertension, follow-up:  BP Readings from Last 3 Encounters:  04/30/16 118/60  04/27/16 118/60  03/31/16 (!) 170/72    She was last seen for hypertension 4 months ago.  Management since that visit includes None.She reports excellent compliance with treatment. She is not having side effects.  She is exercising. She is adherent to low salt diet.   Outside blood pressures are Low 100's over 50's. She is experiencing chest pain and lower extremity edema.  Patient denies dyspnea and irregular heart beat.   Cardiovascular risk factors include advanced age (older than 66 for men, 60 for women), diabetes mellitus, dyslipidemia and hypertension.  ------------------------------------------------------------------------    Lipid/Cholesterol, Follow-up:   Last seen for this 4 months ago.  Management since that visit includes None.  Last Lipid Panel:    Component Value Date/Time   CHOL 186 08/19/2015 1434   TRIG 226 (H) 08/19/2015 1434   HDL 55 08/19/2015 1434   CHOLHDL 4.1 08/30/2014 0506   VLDL 39 08/30/2014  0506   LDLCALC 86 08/19/2015 1434    She reports excellent compliance with treatment. She is not having side effects.   Wt Readings from Last 3 Encounters:  04/30/16 162 lb (73.5 kg)  04/27/16 159 lb 8 oz (72.3 kg)  03/31/16 163 lb 7.5 oz (74.2 kg)    ------------------------------------------------------------------------      Allergies  Allergen Reactions  . Coconut Fatty Acids Swelling    Throat swells     Current Outpatient Prescriptions:  .  albuterol (PROVENTIL) (5 MG/ML) 0.5% nebulizer solution, Take 2.5 mg by nebulization 2 (two) times daily., Disp: , Rfl:  .  atorvastatin (LIPITOR) 20 MG tablet, Take 1 tablet by mouth at  bedtime, Disp: 90 tablet, Rfl: 3 .  budesonide-formoterol (SYMBICORT) 160-4.5 MCG/ACT inhaler, Inhale 2 puffs into the lungs 2 (two) times daily., Disp: 1 Inhaler, Rfl: 0 .  diazepam (VALIUM) 5 MG tablet, Take 1 tablet (5 mg total) by mouth every 6 (six) hours as needed for muscle spasms., Disp: 50 tablet, Rfl: 1 .  gabapentin (NEURONTIN) 600 MG tablet, Take 1 tablet by mouth two  times daily, Disp: 180 tablet, Rfl: 3 .  glimepiride (AMARYL) 2 MG tablet, Take 1 tablet (2 mg total) by mouth 2 (two) times daily., Disp: 60 tablet, Rfl: 2 .  isosorbide mononitrate (IMDUR) 30 MG 24 hr tablet, Take 1 tablet by mouth  daily, Disp: 90 tablet, Rfl: 3 .  lidocaine-prilocaine (EMLA) cream, Apply to port 1-2 hours prior to chemotherapy appointment. Cover with plastic wrap., Disp: 90 g, Rfl: 3 .  magnesium oxide (MAG-OX) 400 (  241.3 MG) MG tablet, Take 1 tablet by mouth 2 (two) times daily., Disp: , Rfl:  .  metFORMIN (GLUCOPHAGE) 1000 MG tablet, Take 1 tablet by mouth  every day, Disp: 90 tablet, Rfl: 3 .  metoCLOPramide (REGLAN) 5 MG/5ML solution, Take 10 mLs (10 mg total) by mouth 4 (four) times daily -  before meals and at bedtime., Disp: 120 mL, Rfl: 0 .  metoprolol tartrate (LOPRESSOR) 25 MG tablet, Take 1 tablet (25 mg total) by mouth 2 (two) times daily.,  Disp: 60 tablet, Rfl: 2 .  Multiple Vitamins-Minerals (ICAPS) CAPS, Take 2 capsules by mouth daily. Reported on 04/17/2015, Disp: , Rfl:  .  nitroGLYCERIN (NITROSTAT) 0.4 MG SL tablet, Place 1 tablet (0.4 mg total) under the tongue every 5 (five) minutes as needed for chest pain., Disp: 25 tablet, Rfl: 3 .  ondansetron (ZOFRAN) 8 MG tablet, Take 1 tablet (8 mg total) by mouth every 8 (eight) hours as needed for nausea or vomiting., Disp: 30 tablet, Rfl: 1 .  pantoprazole (PROTONIX) 40 MG tablet, Take 1 tablet by mouth  daily, Disp: 90 tablet, Rfl: 3 .  pioglitazone (ACTOS) 30 MG tablet, Take 1 tablet (30 mg total) by mouth daily., Disp: 90 tablet, Rfl: 1 .  potassium chloride (K-DUR) 10 MEQ tablet, Take 3 tablets by mouth  daily as directed (Patient taking differently: Take 1 tablet by mouth 3 times daily), Disp: 270 tablet, Rfl: 1 .  prochlorperazine (COMPAZINE) 10 MG tablet, Take 1 tablet (10 mg total) by mouth every 6 (six) hours as needed (Nausea or vomiting)., Disp: 30 tablet, Rfl: 1 .  dexamethasone (DECADRON) 4 MG tablet, Take 1 tablet (4 mg total) by mouth daily., Disp: 30 tablet, Rfl: 0 No current facility-administered medications for this visit.   Facility-Administered Medications Ordered in Other Visits:  .  ondansetron (ZOFRAN) 8 mg, dexamethasone (DECADRON) 10 mg in sodium chloride 0.9 % 50 mL IVPB, , Intravenous, Once, Lloyd Huger, MD  Review of Systems  Constitutional: Negative for activity change, appetite change, chills, diaphoresis, fatigue, fever and unexpected weight change.  Eyes: Negative.   Respiratory: Negative for apnea, cough, choking, chest tightness, shortness of breath, wheezing and stridor.   Cardiovascular: Positive for chest pain. Negative for palpitations and leg swelling (Since started radiation).  Gastrointestinal: Negative for abdominal distention, abdominal pain, anal bleeding, blood in stool, constipation, diarrhea, nausea, rectal pain and vomiting.    Endocrine: Negative.   Musculoskeletal: Negative.   Allergic/Immunologic: Negative.   Neurological: Positive for light-headedness (Sometimes when she stands). Negative for dizziness and headaches.  Psychiatric/Behavioral: Negative.     Social History  Substance Use Topics  . Smoking status: Former Smoker    Packs/day: 1.00    Years: 50.00    Types: Cigarettes    Quit date: 10/03/2015  . Smokeless tobacco: Never Used     Comment: smokes 3 cigs daily 05/06/15. Pt instructed to quit.  . Alcohol use No   Objective:   BP 118/60 (BP Location: Left Arm, Patient Position: Sitting, Cuff Size: Normal)   Pulse 72   Temp 97.7 F (36.5 C) (Oral)   Resp 16   Wt 162 lb (73.5 kg)   BMI 29.63 kg/m   Results for orders placed or performed in visit on 04/30/16  POCT glycosylated hemoglobin (Hb A1C)  Result Value Ref Range   Hemoglobin A1C 8.0      Physical Exam  Constitutional: She is oriented to person, place, and time. She appears well-developed and  well-nourished.  HENT:  Head: Normocephalic and atraumatic.  Right Ear: External ear normal.  Left Ear: External ear normal.  Nose: Nose normal.  Eyes: Conjunctivae are normal.  Neck: Neck supple.  Cardiovascular: Normal rate, regular rhythm and normal heart sounds.   Pulmonary/Chest: Effort normal and breath sounds normal.  Abdominal: Soft.  Neurological: She is alert and oriented to person, place, and time. She has normal reflexes.  Skin: Skin is warm and dry.  Psychiatric: She has a normal mood and affect. Her behavior is normal. Judgment and thought content normal.        Assessment & Plan:      1. Diabetic polyneuropathy associated with other specified diabetes mellitus ([place]) A1C improved at 8.0%; continue current medications recheck in 3-4 months.   - pioglitazone (ACTOS) 30 MG tablet; Take 1 tablet (30 mg total) by mouth daily.  Dispense: 90 tablet; Refill: 1 - POCT glycosylated hemoglobin (Hb A1C)  2. Mixed  hyperlipidemia Stable  3. HYPERTENSION, BENIGN Has been running low at home, will not make any changes for now.  Continue to monitor at home.    4. Primary cancer of right upper lobe of lung ([place]) Stable being followed by [facility]. 5.COPD  Patient seen and examined by Miguel Aschoff, MD, and note scribed by Ashley Royalty, CMA     I have done the exam and reviewed the above chart and it is accurate to the best of my knowledge. Development worker, community has been used in this note in any air is in the dictation or transcription are unintentional.  [name], MD  [place]

## 2016-05-01 ENCOUNTER — Ambulatory Visit
Admission: RE | Admit: 2016-05-01 | Discharge: 2016-05-01 | Disposition: A | Discharge status: Home / Self Care | Payer: Medicare Other | Source: Ambulatory Visit | Attending: Radiation Oncology | Admitting: Radiation Oncology

## 2016-05-01 DIAGNOSIS — Z923 Personal history of irradiation: Secondary | ICD-10-CM | POA: Diagnosis not present

## 2016-05-01 DIAGNOSIS — C7931 Secondary malignant neoplasm of brain: Secondary | ICD-10-CM | POA: Diagnosis not present

## 2016-05-01 DIAGNOSIS — Z510 Encounter for antineoplastic radiation therapy: Secondary | ICD-10-CM | POA: Diagnosis not present

## 2016-05-01 DIAGNOSIS — Z87891 Personal history of nicotine dependence: Secondary | ICD-10-CM | POA: Diagnosis not present

## 2016-05-01 DIAGNOSIS — Z85118 Personal history of other malignant neoplasm of bronchus and lung: Secondary | ICD-10-CM | POA: Diagnosis not present

## 2016-05-04 ENCOUNTER — Ambulatory Visit: Payer: Medicare Other

## 2016-05-04 ENCOUNTER — Inpatient Hospital Stay: Payer: Medicare Other

## 2016-05-04 DIAGNOSIS — Z85118 Personal history of other malignant neoplasm of bronchus and lung: Secondary | ICD-10-CM | POA: Diagnosis not present

## 2016-05-04 DIAGNOSIS — C7931 Secondary malignant neoplasm of brain: Secondary | ICD-10-CM | POA: Diagnosis not present

## 2016-05-04 DIAGNOSIS — Z510 Encounter for antineoplastic radiation therapy: Secondary | ICD-10-CM | POA: Diagnosis not present

## 2016-05-04 DIAGNOSIS — Z87891 Personal history of nicotine dependence: Secondary | ICD-10-CM | POA: Diagnosis not present

## 2016-05-04 DIAGNOSIS — Z923 Personal history of irradiation: Secondary | ICD-10-CM | POA: Diagnosis not present

## 2016-05-05 ENCOUNTER — Ambulatory Visit
Admission: RE | Admit: 2016-05-05 | Discharge: 2016-05-05 | Disposition: A | Discharge status: Home / Self Care | Payer: Medicare Other | Source: Ambulatory Visit | Attending: Radiation Oncology | Admitting: Radiation Oncology

## 2016-05-05 DIAGNOSIS — Z510 Encounter for antineoplastic radiation therapy: Secondary | ICD-10-CM | POA: Diagnosis not present

## 2016-05-05 DIAGNOSIS — Z87891 Personal history of nicotine dependence: Secondary | ICD-10-CM | POA: Diagnosis not present

## 2016-05-05 DIAGNOSIS — Z923 Personal history of irradiation: Secondary | ICD-10-CM | POA: Diagnosis not present

## 2016-05-05 DIAGNOSIS — Z85118 Personal history of other malignant neoplasm of bronchus and lung: Secondary | ICD-10-CM | POA: Diagnosis not present

## 2016-05-05 DIAGNOSIS — C7931 Secondary malignant neoplasm of brain: Secondary | ICD-10-CM | POA: Diagnosis not present

## 2016-05-06 ENCOUNTER — Ambulatory Visit
Admission: RE | Admit: 2016-05-06 | Discharge: 2016-05-06 | Disposition: A | Discharge status: Home / Self Care | Payer: Medicare Other | Source: Ambulatory Visit | Attending: Radiation Oncology | Admitting: Radiation Oncology

## 2016-05-06 DIAGNOSIS — C7931 Secondary malignant neoplasm of brain: Secondary | ICD-10-CM | POA: Diagnosis not present

## 2016-05-06 DIAGNOSIS — Z85118 Personal history of other malignant neoplasm of bronchus and lung: Secondary | ICD-10-CM | POA: Diagnosis not present

## 2016-05-06 DIAGNOSIS — Z510 Encounter for antineoplastic radiation therapy: Secondary | ICD-10-CM | POA: Diagnosis not present

## 2016-05-06 DIAGNOSIS — Z923 Personal history of irradiation: Secondary | ICD-10-CM | POA: Diagnosis not present

## 2016-05-06 DIAGNOSIS — Z87891 Personal history of nicotine dependence: Secondary | ICD-10-CM | POA: Diagnosis not present

## 2016-05-07 ENCOUNTER — Ambulatory Visit
Admission: RE | Admit: 2016-05-07 | Discharge: 2016-05-07 | Disposition: A | Discharge status: Home / Self Care | Payer: Medicare Other | Source: Ambulatory Visit | Attending: Radiation Oncology | Admitting: Radiation Oncology

## 2016-05-07 DIAGNOSIS — Z923 Personal history of irradiation: Secondary | ICD-10-CM | POA: Diagnosis not present

## 2016-05-07 DIAGNOSIS — Z85118 Personal history of other malignant neoplasm of bronchus and lung: Secondary | ICD-10-CM | POA: Diagnosis not present

## 2016-05-07 DIAGNOSIS — Z87891 Personal history of nicotine dependence: Secondary | ICD-10-CM | POA: Diagnosis not present

## 2016-05-07 DIAGNOSIS — C7931 Secondary malignant neoplasm of brain: Secondary | ICD-10-CM | POA: Diagnosis not present

## 2016-05-07 DIAGNOSIS — Z510 Encounter for antineoplastic radiation therapy: Secondary | ICD-10-CM | POA: Diagnosis not present

## 2016-05-08 ENCOUNTER — Ambulatory Visit
Admission: RE | Admit: 2016-05-08 | Discharge: 2016-05-08 | Disposition: A | Discharge status: Home / Self Care | Payer: Medicare Other | Source: Ambulatory Visit | Attending: Radiation Oncology | Admitting: Radiation Oncology

## 2016-05-08 DIAGNOSIS — Z87891 Personal history of nicotine dependence: Secondary | ICD-10-CM | POA: Diagnosis not present

## 2016-05-08 DIAGNOSIS — Z510 Encounter for antineoplastic radiation therapy: Secondary | ICD-10-CM | POA: Diagnosis not present

## 2016-05-08 DIAGNOSIS — C7931 Secondary malignant neoplasm of brain: Secondary | ICD-10-CM | POA: Diagnosis not present

## 2016-05-08 DIAGNOSIS — Z923 Personal history of irradiation: Secondary | ICD-10-CM | POA: Diagnosis not present

## 2016-05-08 DIAGNOSIS — Z85118 Personal history of other malignant neoplasm of bronchus and lung: Secondary | ICD-10-CM | POA: Diagnosis not present

## 2016-05-11 ENCOUNTER — Ambulatory Visit: Payer: Medicare Other

## 2016-05-11 ENCOUNTER — Ambulatory Visit
Admission: RE | Admit: 2016-05-11 | Discharge: 2016-05-11 | Disposition: A | Discharge status: Home / Self Care | Payer: Medicare Other | Source: Ambulatory Visit | Attending: Radiation Oncology | Admitting: Radiation Oncology

## 2016-05-11 ENCOUNTER — Other Ambulatory Visit: Payer: Self-pay | Admitting: *Deleted

## 2016-05-11 DIAGNOSIS — Z87891 Personal history of nicotine dependence: Secondary | ICD-10-CM | POA: Diagnosis not present

## 2016-05-11 DIAGNOSIS — C7931 Secondary malignant neoplasm of brain: Secondary | ICD-10-CM | POA: Diagnosis not present

## 2016-05-11 DIAGNOSIS — Z923 Personal history of irradiation: Secondary | ICD-10-CM | POA: Diagnosis not present

## 2016-05-11 DIAGNOSIS — Z85118 Personal history of other malignant neoplasm of bronchus and lung: Secondary | ICD-10-CM | POA: Diagnosis not present

## 2016-05-11 DIAGNOSIS — Z510 Encounter for antineoplastic radiation therapy: Secondary | ICD-10-CM | POA: Diagnosis not present

## 2016-05-12 ENCOUNTER — Ambulatory Visit
Admission: RE | Admit: 2016-05-12 | Discharge: 2016-05-12 | Disposition: A | Discharge status: Home / Self Care | Payer: Medicare Other | Source: Ambulatory Visit | Attending: Radiation Oncology | Admitting: Radiation Oncology

## 2016-05-12 DIAGNOSIS — C7931 Secondary malignant neoplasm of brain: Secondary | ICD-10-CM | POA: Diagnosis not present

## 2016-05-12 DIAGNOSIS — Z87891 Personal history of nicotine dependence: Secondary | ICD-10-CM | POA: Diagnosis not present

## 2016-05-12 DIAGNOSIS — Z85118 Personal history of other malignant neoplasm of bronchus and lung: Secondary | ICD-10-CM | POA: Diagnosis not present

## 2016-05-12 DIAGNOSIS — Z923 Personal history of irradiation: Secondary | ICD-10-CM | POA: Diagnosis not present

## 2016-05-12 DIAGNOSIS — Z510 Encounter for antineoplastic radiation therapy: Secondary | ICD-10-CM | POA: Diagnosis not present

## 2016-05-13 ENCOUNTER — Ambulatory Visit
Admission: RE | Admit: 2016-05-13 | Discharge: 2016-05-13 | Disposition: A | Discharge status: Home / Self Care | Payer: Medicare Other | Source: Ambulatory Visit | Attending: Radiation Oncology | Admitting: Radiation Oncology

## 2016-05-13 ENCOUNTER — Other Ambulatory Visit: Payer: Self-pay | Admitting: *Deleted

## 2016-05-13 DIAGNOSIS — Z510 Encounter for antineoplastic radiation therapy: Secondary | ICD-10-CM | POA: Diagnosis not present

## 2016-05-13 DIAGNOSIS — Z85118 Personal history of other malignant neoplasm of bronchus and lung: Secondary | ICD-10-CM | POA: Diagnosis not present

## 2016-05-13 DIAGNOSIS — Z87891 Personal history of nicotine dependence: Secondary | ICD-10-CM | POA: Diagnosis not present

## 2016-05-13 DIAGNOSIS — C7931 Secondary malignant neoplasm of brain: Secondary | ICD-10-CM | POA: Diagnosis not present

## 2016-05-13 DIAGNOSIS — Z923 Personal history of irradiation: Secondary | ICD-10-CM | POA: Diagnosis not present

## 2016-05-13 MED ORDER — DIAZEPAM 5 MG PO TABS: 5.0000 mg | ORAL_TABLET | Freq: Four times a day (QID) | ORAL | 0 refills | Status: DC | PRN

## 2016-05-14 ENCOUNTER — Ambulatory Visit
Admission: RE | Admit: 2016-05-14 | Discharge: 2016-05-14 | Disposition: A | Discharge status: Home / Self Care | Payer: Medicare Other | Source: Ambulatory Visit | Attending: Radiation Oncology | Admitting: Radiation Oncology

## 2016-05-14 ENCOUNTER — Ambulatory Visit: Payer: Medicare Other

## 2016-05-14 DIAGNOSIS — Z87891 Personal history of nicotine dependence: Secondary | ICD-10-CM | POA: Diagnosis not present

## 2016-05-14 DIAGNOSIS — Z85118 Personal history of other malignant neoplasm of bronchus and lung: Secondary | ICD-10-CM | POA: Diagnosis not present

## 2016-05-14 DIAGNOSIS — C7931 Secondary malignant neoplasm of brain: Secondary | ICD-10-CM | POA: Diagnosis not present

## 2016-05-14 DIAGNOSIS — Z923 Personal history of irradiation: Secondary | ICD-10-CM | POA: Diagnosis not present

## 2016-05-14 DIAGNOSIS — Z510 Encounter for antineoplastic radiation therapy: Secondary | ICD-10-CM | POA: Diagnosis not present

## 2016-05-15 ENCOUNTER — Telehealth: Payer: Self-pay | Admitting: Family Medicine

## 2016-05-15 DIAGNOSIS — C7931 Secondary malignant neoplasm of brain: Secondary | ICD-10-CM | POA: Diagnosis not present

## 2016-05-15 DIAGNOSIS — Z510 Encounter for antineoplastic radiation therapy: Secondary | ICD-10-CM | POA: Diagnosis not present

## 2016-05-15 DIAGNOSIS — Z85118 Personal history of other malignant neoplasm of bronchus and lung: Secondary | ICD-10-CM | POA: Diagnosis not present

## 2016-05-15 DIAGNOSIS — Z87891 Personal history of nicotine dependence: Secondary | ICD-10-CM | POA: Diagnosis not present

## 2016-05-15 DIAGNOSIS — Z923 Personal history of irradiation: Secondary | ICD-10-CM | POA: Diagnosis not present

## 2016-05-15 NOTE — Telephone Encounter (Signed)
Called Pt to schedule AWV with [facility] - knb °

## 2016-05-26 DIAGNOSIS — R0602 Shortness of breath: Secondary | ICD-10-CM | POA: Diagnosis not present

## 2016-05-26 DIAGNOSIS — J449 Chronic obstructive pulmonary disease, unspecified: Secondary | ICD-10-CM | POA: Diagnosis not present

## 2016-05-28 DIAGNOSIS — J449 Chronic obstructive pulmonary disease, unspecified: Secondary | ICD-10-CM | POA: Diagnosis not present

## 2016-05-29 ENCOUNTER — Encounter: Payer: Self-pay | Admitting: Vascular Surgery

## 2016-06-01 ENCOUNTER — Other Ambulatory Visit: Payer: Self-pay | Admitting: Emergency Medicine

## 2016-06-01 MED ORDER — GLIMEPIRIDE 2 MG PO TABS: 2.0000 mg | ORAL_TABLET | Freq: Two times a day (BID) | ORAL | 2 refills | Status: DC

## 2016-06-01 MED ORDER — METOPROLOL TARTRATE 25 MG PO TABS: 25.0000 mg | ORAL_TABLET | Freq: Two times a day (BID) | ORAL | 2 refills | Status: DC

## 2016-06-08 ENCOUNTER — Ambulatory Visit: Payer: Self-pay | Admitting: Radiation Oncology

## 2016-06-16 ENCOUNTER — Ambulatory Visit
Admission: RE | Admit: 2016-06-16 | Discharge: 2016-06-16 | Disposition: A | Discharge status: Home / Self Care | Payer: Medicare Other | Source: Ambulatory Visit | Attending: Oncology | Admitting: Oncology

## 2016-06-16 ENCOUNTER — Other Ambulatory Visit: Payer: Self-pay | Admitting: Oncology

## 2016-06-16 DIAGNOSIS — C3411 Malignant neoplasm of upper lobe, right bronchus or lung: Secondary | ICD-10-CM | POA: Insufficient documentation

## 2016-06-16 LAB — POCT I-STAT CREATININE: CREATININE: 1.7 mg/dL — AB (ref 0.44–1.00)

## 2016-06-17 NOTE — Progress Notes (Signed)
[name] [name]  [phone] [phone]) [phone]  ID: [name] [name] OB: 07-01-1940  MR#: [id number]  UKG#:[id number]  Patient Care Team: Jerrol Banana., MD as PCP - General (Unknown Physician Specialty) [name], MD as Consulting Physician (Cardiology) Algernon Huxley, MD as Consulting Physician (Vascular Surgery)  CHIEF COMPLAINT: Clinical stage IIIa small cell lung carcinoma of the right upper lobe lung.  INTERVAL HISTORY: Patient returns to clinic today for further evaluation and discussion of her imaging results. She has completed PCI.  She currently feels well and is back to her baseline. She has no neurologic complaints. She denies any fevers. She denies any chest pain, shortness of breath, cough, or hemoptysis. She has a good appetite and denies weight loss. She denies any nausea, vomiting, constipation, or diarrhea. She has no urinary complaints. Patient offers no specific complaints today.  REVIEW OF SYSTEMS:   Review of Systems  Constitutional: Negative for fever, malaise/fatigue and weight loss.  Respiratory: Negative for cough, hemoptysis and shortness of breath.   Cardiovascular: Negative.  Negative for chest pain and leg swelling.  Gastrointestinal: Negative for abdominal pain, melena, nausea and vomiting.  Genitourinary: Negative.   Musculoskeletal: Negative.   Neurological: Negative for sensory change and weakness.  Psychiatric/Behavioral: Negative.  The patient is not nervous/anxious.     As per HPI. Otherwise, a complete review of systems is negative.  PAST MEDICAL HISTORY: Past Medical History:  Diagnosis Date  . Abnormal CT lung screening 10/17/2015  . COPD (chronic obstructive pulmonary disease) ([place])   . Coronary artery disease, non-occlusive    a. cath 2006: min nonobs CAD; b. cath 12/2010: cath LAD 50%, RCA 60%; c. 08/2013: Minimal luminal irregs, right dominant system with no significant CAD, diffuse luminal irregs noted.  Normal EF 55%, no AS or MS.   . Diabetes mellitus   . Hyperlipemia    Followed by Dr. Rosanna Randy  . Hypertension   . Lung cancer ([place])   . Macular degeneration    rt  . Personal history of tobacco use, presenting hazards to health 10/15/2015  . Pneumonia    hx  . Shortness of breath   . Stroke [facility])     PAST SURGICAL HISTORY: Past Surgical History:  Procedure Laterality Date  . ABDOMINAL HYSTERECTOMY    . ANTERIOR CERVICAL DECOMP/DISCECTOMY FUSION N/A 10/19/2013   Procedure: CERVICAL FIVE-SIX ANTERIOR CERVICAL DECOMPRESSION WITH FUSION INTERBODY PROSTHESIS PLATING AND PEEK CAGE;  Surgeon: Ophelia Charter, MD;  Location: [facility];  Service: Neurosurgery;  Laterality: N/A;  . BACK SURGERY  80's  . BREAST CYST EXCISION Left    left negative   . CARDIAC CATHETERIZATION  05/2004  . CATARACT EXTRACTION Left   . CHOLECYSTECTOMY    . ENDARTERECTOMY Left 10/24/2014   Procedure: ENDARTERECTOMY CAROTID;  Surgeon: Algernon Huxley, MD;  Location: [facility] ORS;  Service: Vascular;  Laterality: Left;  . ENDOBRONCHIAL ULTRASOUND N/A 11/14/2015   Procedure: ENDOBRONCHIAL ULTRASOUND;  Surgeon: [name], MD;  Location: [facility] ORS;  Service: Cardiopulmonary;  Laterality: N/A;  . PERIPHERAL VASCULAR CATHETERIZATION N/A 12/04/2015   Procedure: Glori Luis Cath Insertion;  Surgeon: Algernon Huxley, MD;  Location: [facility];  Service: Cardiovascular;  Laterality: N/A;  . TONSILLECTOMY AND ADENOIDECTOMY    . VESICOVAGINAL FISTULA CLOSURE W/ TAH      FAMILY HISTORY: Family History  Problem Relation Age of Onset  . Stroke Mother     Massive  . Heart attack Father  Massive  . Heart attack Brother   . Heart attack Brother   . Lung cancer Maternal Grandfather   . Heart attack Paternal Grandmother     MI       ADVANCED DIRECTIVES (Y/N):  N   HEALTH MAINTENANCE: Social History  Substance Use Topics  . Smoking status: Former Smoker    Packs/day: 1.00    Years: 50.00    Types: Cigarettes     Quit date: 10/03/2015  . Smokeless tobacco: Never Used     Comment: smokes 3 cigs daily 05/06/15. Pt instructed to quit.  . Alcohol use No     Colonoscopy:  PAP:  Bone density:  Lipid panel:  Allergies  Allergen Reactions  . Coconut Fatty Acids Swelling    Throat swells    Current Outpatient Prescriptions  Medication Sig Dispense Refill  . albuterol (PROVENTIL) (5 MG/ML) 0.5% nebulizer solution Take 2.5 mg by nebulization 2 (two) times daily.    Marland Kitchen atorvastatin (LIPITOR) 20 MG tablet Take 1 tablet by mouth at  bedtime 90 tablet 3  . budesonide-formoterol (SYMBICORT) 160-4.5 MCG/ACT inhaler Inhale 2 puffs into the lungs 2 (two) times daily. 1 Inhaler 0  . diazepam (VALIUM) 5 MG tablet Take 1 tablet (5 mg total) by mouth every 6 (six) hours as needed for muscle spasms. 30 tablet 0  . gabapentin (NEURONTIN) 600 MG tablet Take 1 tablet by mouth two  times daily 180 tablet 3  . glimepiride (AMARYL) 2 MG tablet Take 1 tablet (2 mg total) by mouth 2 (two) times daily. 60 tablet 2  . isosorbide mononitrate (IMDUR) 30 MG 24 hr tablet Take 1 tablet by mouth  daily 90 tablet 3  . lidocaine-prilocaine (EMLA) cream Apply to port 1-2 hours prior to chemotherapy appointment. Cover with plastic wrap. 90 g 3  . magnesium oxide (MAG-OX) 400 (241.3 MG) MG tablet Take 1 tablet by mouth 2 (two) times daily.    . metFORMIN (GLUCOPHAGE) 1000 MG tablet Take 1 tablet by mouth  every day 90 tablet 3  . metoprolol tartrate (LOPRESSOR) 25 MG tablet Take 1 tablet (25 mg total) by mouth 2 (two) times daily. 60 tablet 2  . Multiple Vitamins-Minerals (ICAPS) CAPS Take 2 capsules by mouth daily. Reported on 04/17/2015    . nitroGLYCERIN (NITROSTAT) 0.4 MG SL tablet Place 1 tablet (0.4 mg total) under the tongue every 5 (five) minutes as needed for chest pain. 25 tablet 3  . ondansetron (ZOFRAN) 8 MG tablet Take 1 tablet (8 mg total) by mouth every 8 (eight) hours as needed for nausea or vomiting. 30 tablet 1  .  pantoprazole (PROTONIX) 40 MG tablet Take 1 tablet by mouth  daily 90 tablet 3  . pioglitazone (ACTOS) 30 MG tablet Take 1 tablet (30 mg total) by mouth daily. 90 tablet 1  . potassium chloride (K-DUR) 10 MEQ tablet Take 3 tablets by mouth  daily as directed (Patient taking differently: Take 1 tablet by mouth 3 times daily) 270 tablet 1  . prochlorperazine (COMPAZINE) 10 MG tablet Take 1 tablet (10 mg total) by mouth every 6 (six) hours as needed (Nausea or vomiting). 30 tablet 1   No current facility-administered medications for this visit.    Facility-Administered Medications Ordered in Other Visits  Medication Dose Route Frequency Provider Last Rate Last Dose  . ondansetron (ZOFRAN) 8 mg, dexamethasone (DECADRON) 10 mg in sodium chloride 0.9 % 50 mL IVPB   Intravenous Once Lloyd Huger, MD  OBJECTIVE: Vitals:   06/18/16 1352  BP: (!) 118/54     Body mass index is 30.38 kg/m.    ECOG FS:1 - Symptomatic but completely ambulatory  General: Well-developed, well-nourished, no acute distress. Eyes: Pink conjunctiva, anicteric sclera. Lungs: Clear to auscultation bilaterally. Heart: Regular rate and rhythm. No rubs, murmurs, or gallops. Abdomen: Soft, nontender, nondistended. No organomegaly noted, normoactive bowel sounds. Musculoskeletal: No edema, cyanosis, or clubbing. Neuro: Alert, answering all questions appropriately. Cranial nerves grossly intact. Skin: No rashes or petechiae noted. Psych: Normal affect.   LAB RESULTS:  Lab Results  Component Value Date   NA 138 03/02/2016   K 5.3 (H) 03/02/2016   CL 100 (L) 03/02/2016   CO2 28 03/02/2016   GLUCOSE 181 (H) 03/02/2016   BUN 37 (H) 03/02/2016   CREATININE 1.70 (H) 06/16/2016   CALCIUM 9.1 03/02/2016   PROT 7.3 03/02/2016   ALBUMIN 3.7 03/02/2016   AST 13 (L) 03/02/2016   ALT 11 (L) 03/02/2016   ALKPHOS 67 03/02/2016   BILITOT 0.4 03/02/2016   GFRNONAA 29 (L) 03/02/2016   GFRAA 33 (L) 03/02/2016    Lab  Results  Component Value Date   WBC 5.0 03/02/2016   NEUTROABS 3.1 03/02/2016   HGB 8.1 (L) 03/02/2016   HCT 23.5 (L) 03/02/2016   MCV 95.8 03/02/2016   PLT 362 03/02/2016     STUDIES: Ct Chest Wo Contrast  Result Date: 06/16/2016 CLINICAL DATA:  Primary right upper lobe lung cancer diagnosed 1 year ago. EXAM: CT CHEST WITHOUT CONTRAST TECHNIQUE: Multidetector CT imaging of the chest was performed following the standard protocol without IV contrast. COMPARISON:  PET-CT 03/17/2016.  Chest CT 10/15/2015. FINDINGS: Cardiovascular: The heart size is normal. No pericardial effusion. Atherosclerotic calcification is noted in the wall of the thoracic aorta. Coronary artery calcification is noted. Left Port-A-Cath tip is at the junction of the SVC and RA. Mediastinum/Nodes: No mediastinal lymphadenopathy. There is no hilar lymphadenopathy. The esophagus has normal imaging features. There is no axillary lymphadenopathy. Lungs/Pleura: Centrilobular and paraseptal emphysema noted. Scarring noted in the medial right middle lobe and lingula as before. The 6 mm minimal subpleural thickening identified on the previous study at the site of the hypermetabolic lesion on earlier PET-CT is not substantially changed measuring 5 mm today (see image 69 series 3). No new or progressive pulmonary nodule or mass. No focal airspace consolidation. No pulmonary edema or pleural effusion. Upper Abdomen: Left adrenal adenoma is stable measuring about 2.5 cm. Gallbladder surgically absent. Musculoskeletal: Bone windows reveal no worrisome lytic or sclerotic osseous lesions. IMPRESSION: 1. Stable exam. No new or progressive findings to suggest recurrent or metastatic disease. Electronically Signed   By: Misty Stanley M.D.   On: 06/16/2016 10:56    ASSESSMENT: Clinical stage IIIa small cell lung carcinoma of the right upper lobe lung.  PLAN:    1. Clinical stage IIIa small cell lung carcinoma of the right upper lobe lung: CT  scan results from June 16, 2016 reviewed independently and reported as above with no obvious evidence of persistent or recurrent disease. MRI the brain is negative and patient has completed PCI. Patient completed cycle 3 of cisplatin and etoposide on October 24-26, 2017. Given patient's extensive hospitalization after cycle 3, treatment was discontinued. No further intervention is needed at this time. Return to clinic in 3 months with repeat imaging using CT scan and further evaluation. 2. Neutropenia: Resolved.  3. Thrombocytopenia: Resolved. 4. Anemia: Decreased, but stable. No intervention is  needed at this time. Monitor. 5. Renal insufficiency: Patient's creatinine appears to be at her baseline, monitor. 6. Hyperglycemia: Elevated but improved. Monitor.   Patient expressed understanding and was in agreement with this plan. She also understands that She can call clinic at any time with any questions, concerns, or complaints.   Cancer Staging Primary cancer of right upper lobe of lung [facility]) Staging form: Lung, AJCC 7th Edition - Clinical stage from 11/26/2015: Stage IIIA (T1a, N2, M0) - Signed by Lloyd Huger, MD on 11/26/2015   Lloyd Huger, MD   06/21/2016 10:48 AM

## 2016-06-18 ENCOUNTER — Inpatient Hospital Stay: Payer: Medicare Other | Attending: Oncology | Admitting: Oncology

## 2016-06-18 ENCOUNTER — Encounter: Payer: Self-pay | Admitting: Radiation Oncology

## 2016-06-18 ENCOUNTER — Ambulatory Visit
Admission: RE | Admit: 2016-06-18 | Discharge: 2016-06-18 | Disposition: A | Discharge status: Home / Self Care | Payer: Medicare Other | Source: Ambulatory Visit | Attending: Radiation Oncology | Admitting: Radiation Oncology

## 2016-06-18 VITALS — BP 118/54 | Wt 166.1 lb

## 2016-06-18 VITALS — BP 118/54 | Temp 96.3°F | Resp 22 | Wt 166.0 lb

## 2016-06-18 DIAGNOSIS — I10 Essential (primary) hypertension: Secondary | ICD-10-CM

## 2016-06-18 DIAGNOSIS — E785 Hyperlipidemia, unspecified: Secondary | ICD-10-CM | POA: Diagnosis not present

## 2016-06-18 DIAGNOSIS — N2889 Other specified disorders of kidney and ureter: Secondary | ICD-10-CM | POA: Insufficient documentation

## 2016-06-18 DIAGNOSIS — Z801 Family history of malignant neoplasm of trachea, bronchus and lung: Secondary | ICD-10-CM | POA: Diagnosis not present

## 2016-06-18 DIAGNOSIS — Z7984 Long term (current) use of oral hypoglycemic drugs: Secondary | ICD-10-CM | POA: Diagnosis not present

## 2016-06-18 DIAGNOSIS — R5383 Other fatigue: Secondary | ICD-10-CM | POA: Insufficient documentation

## 2016-06-18 DIAGNOSIS — Z452 Encounter for adjustment and management of vascular access device: Secondary | ICD-10-CM

## 2016-06-18 DIAGNOSIS — Z923 Personal history of irradiation: Secondary | ICD-10-CM | POA: Insufficient documentation

## 2016-06-18 DIAGNOSIS — D649 Anemia, unspecified: Secondary | ICD-10-CM | POA: Insufficient documentation

## 2016-06-18 DIAGNOSIS — Z87891 Personal history of nicotine dependence: Secondary | ICD-10-CM | POA: Diagnosis not present

## 2016-06-18 DIAGNOSIS — C3411 Malignant neoplasm of upper lobe, right bronchus or lung: Secondary | ICD-10-CM | POA: Insufficient documentation

## 2016-06-18 DIAGNOSIS — I2510 Atherosclerotic heart disease of native coronary artery without angina pectoris: Secondary | ICD-10-CM

## 2016-06-18 DIAGNOSIS — Z79899 Other long term (current) drug therapy: Secondary | ICD-10-CM | POA: Insufficient documentation

## 2016-06-18 DIAGNOSIS — J449 Chronic obstructive pulmonary disease, unspecified: Secondary | ICD-10-CM | POA: Diagnosis not present

## 2016-06-18 DIAGNOSIS — E1165 Type 2 diabetes mellitus with hyperglycemia: Secondary | ICD-10-CM | POA: Insufficient documentation

## 2016-06-18 DIAGNOSIS — Z8673 Personal history of transient ischemic attack (TIA), and cerebral infarction without residual deficits: Secondary | ICD-10-CM | POA: Diagnosis not present

## 2016-06-18 NOTE — Progress Notes (Signed)
Radiation Oncology Follow up Note  Name: [name] [name]   Date:   06/18/2016 MRN:  [id number] DOB: 02/14/1941    This 76 y.o. female presents to the clinic today for one-month follow-up status post prophylactic cranial irradiation.  REFERRING PROVIDER: Jerrol Banana.,*  HPI: Patient is a 76 year old female now 1 month out having completed prophylactic cranial irradiation for stage IIIa limited stage small cell lung cancer. She is seen today in routine follow-up and is doing fairly well still quite tired fatigue and has some decreased strength in her lower extremities. She is a little shaky when she ambulates. She recently had a CT scan of the chest. Which is stable showing no new or progressive findings suggest recurrent or metastatic disease. She specifically denies any change in neurologic status headaches visual disturbances cough or bone pain.  COMPLICATIONS OF TREATMENT: none  FOLLOW UP COMPLIANCE: keeps appointments   PHYSICAL EXAM:  BP (!) 118/54   Temp (!) 96.3 F (35.7 C)   Resp (!) 22   Wt 166 lb 0.1 oz (75.3 kg)   BMI 30.36 kg/m  Well-developed well-nourished patient in NAD. HEENT reveals PERLA, EOMI, discs not visualized.  Oral cavity is clear. No oral mucosal lesions are identified. Neck is clear without evidence of cervical or supraclavicular adenopathy. Lungs are clear to A&P. Cardiac examination is essentially unremarkable with regular rate and rhythm without murmur rub or thrill. Abdomen is benign with no organomegaly or masses noted. Motor sensory and DTR levels are equal and symmetric in the upper and lower extremities. Cranial nerves II through XII are grossly intact. Proprioception is intact. No peripheral adenopathy or edema is identified. No motor or sensory levels are noted. Crude visual fields are within normal range.  RADIOLOGY RESULTS: CT scan is reviewed and compatible with the above-stated findings  PLAN: Present time patient is doing well. I've  assured her she will have continued progress with her fatigue. She is continue close follow-up care with medical oncology. I've reviewed her CT scans with her. I've encouraged her to start exercising and may be using a cane for stability during her ambulation. I have asked to see her out in 4 months for follow-up. Patient is to call sooner with any concerns.  I would like to take this opportunity to thank you for allowing me to participate in the care of your patient.Armstead Peaks., MD

## 2016-06-18 NOTE — Progress Notes (Signed)
Patient here today for follow up.   

## 2016-06-23 DIAGNOSIS — R0602 Shortness of breath: Secondary | ICD-10-CM | POA: Diagnosis not present

## 2016-06-23 DIAGNOSIS — J449 Chronic obstructive pulmonary disease, unspecified: Secondary | ICD-10-CM | POA: Diagnosis not present

## 2016-06-25 DIAGNOSIS — J449 Chronic obstructive pulmonary disease, unspecified: Secondary | ICD-10-CM | POA: Diagnosis not present

## 2016-06-30 ENCOUNTER — Inpatient Hospital Stay: Payer: Medicare Other

## 2016-06-30 DIAGNOSIS — J449 Chronic obstructive pulmonary disease, unspecified: Secondary | ICD-10-CM | POA: Diagnosis not present

## 2016-06-30 DIAGNOSIS — D649 Anemia, unspecified: Secondary | ICD-10-CM | POA: Diagnosis not present

## 2016-06-30 DIAGNOSIS — Z7984 Long term (current) use of oral hypoglycemic drugs: Secondary | ICD-10-CM | POA: Diagnosis not present

## 2016-06-30 DIAGNOSIS — Z87891 Personal history of nicotine dependence: Secondary | ICD-10-CM | POA: Diagnosis not present

## 2016-06-30 DIAGNOSIS — N2889 Other specified disorders of kidney and ureter: Secondary | ICD-10-CM | POA: Diagnosis not present

## 2016-06-30 DIAGNOSIS — I10 Essential (primary) hypertension: Secondary | ICD-10-CM | POA: Diagnosis not present

## 2016-06-30 DIAGNOSIS — E785 Hyperlipidemia, unspecified: Secondary | ICD-10-CM | POA: Diagnosis not present

## 2016-06-30 DIAGNOSIS — C3411 Malignant neoplasm of upper lobe, right bronchus or lung: Secondary | ICD-10-CM | POA: Diagnosis not present

## 2016-06-30 DIAGNOSIS — Z95828 Presence of other vascular implants and grafts: Secondary | ICD-10-CM

## 2016-06-30 DIAGNOSIS — Z8673 Personal history of transient ischemic attack (TIA), and cerebral infarction without residual deficits: Secondary | ICD-10-CM | POA: Diagnosis not present

## 2016-06-30 DIAGNOSIS — Z801 Family history of malignant neoplasm of trachea, bronchus and lung: Secondary | ICD-10-CM | POA: Diagnosis not present

## 2016-06-30 DIAGNOSIS — Z452 Encounter for adjustment and management of vascular access device: Secondary | ICD-10-CM | POA: Diagnosis not present

## 2016-06-30 DIAGNOSIS — E1165 Type 2 diabetes mellitus with hyperglycemia: Secondary | ICD-10-CM | POA: Diagnosis not present

## 2016-06-30 DIAGNOSIS — Z79899 Other long term (current) drug therapy: Secondary | ICD-10-CM | POA: Diagnosis not present

## 2016-06-30 DIAGNOSIS — I2510 Atherosclerotic heart disease of native coronary artery without angina pectoris: Secondary | ICD-10-CM | POA: Diagnosis not present

## 2016-06-30 MED ORDER — SODIUM CHLORIDE 0.9% FLUSH
10.0000 mL | INTRAVENOUS | Status: DC | PRN
  Administered 2016-06-30: 10 mL via INTRAVENOUS
  Filled 2016-06-30: qty 10

## 2016-06-30 MED ORDER — HEPARIN SOD (PORK) LOCK FLUSH 100 UNIT/ML IV SOLN
500.0000 [IU] | Freq: Once | INTRAVENOUS | Status: AC
  Administered 2016-06-30: 500 [IU] via INTRAVENOUS

## 2016-07-03 ENCOUNTER — Encounter: Payer: Self-pay | Admitting: *Deleted

## 2016-07-07 ENCOUNTER — Other Ambulatory Visit: Payer: Self-pay | Admitting: Family Medicine

## 2016-07-07 ENCOUNTER — Telehealth: Payer: Self-pay | Admitting: Family Medicine

## 2016-07-07 ENCOUNTER — Encounter: Payer: Self-pay | Admitting: Family Medicine

## 2016-07-07 ENCOUNTER — Ambulatory Visit (INDEPENDENT_AMBULATORY_CARE_PROVIDER_SITE_OTHER): Payer: Medicare Other | Admitting: Family Medicine

## 2016-07-07 VITALS — BP 124/70 | HR 81 | Temp 98.5°F | Resp 17 | Wt 162.2 lb

## 2016-07-07 DIAGNOSIS — M5441 Lumbago with sciatica, right side: Secondary | ICD-10-CM

## 2016-07-07 DIAGNOSIS — G8929 Other chronic pain: Secondary | ICD-10-CM | POA: Diagnosis not present

## 2016-07-07 DIAGNOSIS — J441 Chronic obstructive pulmonary disease with (acute) exacerbation: Secondary | ICD-10-CM | POA: Diagnosis not present

## 2016-07-07 DIAGNOSIS — M5442 Lumbago with sciatica, left side: Secondary | ICD-10-CM

## 2016-07-07 MED ORDER — DIAZEPAM 5 MG PO TABS: 5.0000 mg | ORAL_TABLET | Freq: Four times a day (QID) | ORAL | 5 refills | Status: DC | PRN

## 2016-07-07 MED ORDER — DOXYCYCLINE HYCLATE 100 MG PO TABS: 100.0000 mg | ORAL_TABLET | Freq: Two times a day (BID) | ORAL | 0 refills | Status: DC

## 2016-07-07 MED ORDER — AZITHROMYCIN 250 MG PO TABS: ORAL_TABLET | ORAL | 0 refills | Status: DC

## 2016-07-07 NOTE — Telephone Encounter (Signed)
I have changed to generic Z-pack.

## 2016-07-07 NOTE — Progress Notes (Signed)
Subjective:     Patient ID: [name] [name], female   DOB: Jan 19, 1941, 76 y.o.   MRN: [id number]  HPI  Chief Complaint  Patient presents with  . Back Pain    Patient comes in office today to address chronic back pain, patient reports that she had a CT scan done a week ago that showed that her kidneys had dropped. Patients husband who is accompanied with patient states that patient has had problems in past do to kidney function, but is not related to todays problem.  . Cough    Patients husband states he has concerns of cough that patient began having at night as of 3/30/8. Patients husband says patient has a history of pneumonia and is concerned.   States she is more short of breath reporting cold sx over the last two days. Cough is non-productive. States she has been compliant with her inhalers. Reports remote back surgery and a fall a month ago. Low back pain is helped with diazepam and she wishes refill. States it will radiate at times down the back of her legs to her feet. Hx of CKD 4. Accompanied by her husband today.   Review of Systems     Objective:   Physical Exam  Constitutional: She appears well-developed and well-nourished. She appears ill (chronically). No distress.  Musculoskeletal:  Localizes pain to her left SI area.  Ears: T.M's intact without inflammation Throat: tonsils absent Neck: no cervical adenopathy Lungs: clear     Assessment:    1. Chronic low back pain with bilateral sciatica, unspecified back pain laterality - diazepam (VALIUM) 5 MG tablet; Take 1 tablet (5 mg total) by mouth every 6 (six) hours as needed for muscle spasms.  Dispense: 30 tablet; Refill: 5 - DG Lumbar Spine Complete; Future  2. COPD exacerbation ([facility]) - doxycycline (VIBRA-TABS) 100 MG tablet; Take 1 tablet (100 mg total) by mouth 2 (two) times daily.  Dispense: 20 tablet; Refill: 0    Plan:    Further f/u pending x-ray result.

## 2016-07-07 NOTE — Telephone Encounter (Signed)
Patient has been advised. KW 

## 2016-07-07 NOTE — Telephone Encounter (Signed)
Pt husband is requesting the Rx for doxycycline (VIBRA-TABS) 100 MG tablet changed to something else due to the cost.  [facility].  PJ#[phone]/YT

## 2016-07-07 NOTE — Patient Instructions (Signed)
We will call you with the x-ray report when you complete it.

## 2016-07-09 DIAGNOSIS — H353212 Exudative age-related macular degeneration, right eye, with inactive choroidal neovascularization: Secondary | ICD-10-CM | POA: Diagnosis not present

## 2016-07-13 ENCOUNTER — Other Ambulatory Visit: Payer: Self-pay | Admitting: Family Medicine

## 2016-07-13 DIAGNOSIS — Z1231 Encounter for screening mammogram for malignant neoplasm of breast: Secondary | ICD-10-CM

## 2016-07-14 ENCOUNTER — Ambulatory Visit
Admission: RE | Admit: 2016-07-14 | Discharge: 2016-07-14 | Disposition: A | Discharge status: Home / Self Care | Payer: Medicare Other | Source: Ambulatory Visit | Attending: Family Medicine | Admitting: Family Medicine

## 2016-07-14 ENCOUNTER — Emergency Department: Payer: Medicare Other

## 2016-07-14 ENCOUNTER — Encounter: Payer: Self-pay | Admitting: Intensive Care

## 2016-07-14 ENCOUNTER — Inpatient Hospital Stay
Admission: EM | Admit: 2016-07-14 | Discharge: 2016-07-16 | DRG: 684 | Disposition: A | Discharge status: Home / Self Care | Payer: Medicare Other | Attending: Internal Medicine | Admitting: Internal Medicine

## 2016-07-14 DIAGNOSIS — I2510 Atherosclerotic heart disease of native coronary artery without angina pectoris: Secondary | ICD-10-CM | POA: Diagnosis not present

## 2016-07-14 DIAGNOSIS — Z9049 Acquired absence of other specified parts of digestive tract: Secondary | ICD-10-CM

## 2016-07-14 DIAGNOSIS — I129 Hypertensive chronic kidney disease with stage 1 through stage 4 chronic kidney disease, or unspecified chronic kidney disease: Secondary | ICD-10-CM | POA: Diagnosis not present

## 2016-07-14 DIAGNOSIS — Z9889 Other specified postprocedural states: Secondary | ICD-10-CM | POA: Diagnosis not present

## 2016-07-14 DIAGNOSIS — M5441 Lumbago with sciatica, right side: Principal | ICD-10-CM

## 2016-07-14 DIAGNOSIS — G8929 Other chronic pain: Secondary | ICD-10-CM | POA: Insufficient documentation

## 2016-07-14 DIAGNOSIS — N1330 Unspecified hydronephrosis: Secondary | ICD-10-CM | POA: Diagnosis not present

## 2016-07-14 DIAGNOSIS — Z801 Family history of malignant neoplasm of trachea, bronchus and lung: Secondary | ICD-10-CM

## 2016-07-14 DIAGNOSIS — Z981 Arthrodesis status: Secondary | ICD-10-CM | POA: Diagnosis not present

## 2016-07-14 DIAGNOSIS — E785 Hyperlipidemia, unspecified: Secondary | ICD-10-CM | POA: Diagnosis not present

## 2016-07-14 DIAGNOSIS — N179 Acute kidney failure, unspecified: Secondary | ICD-10-CM | POA: Diagnosis present

## 2016-07-14 DIAGNOSIS — Z7951 Long term (current) use of inhaled steroids: Secondary | ICD-10-CM | POA: Diagnosis not present

## 2016-07-14 DIAGNOSIS — E119 Type 2 diabetes mellitus without complications: Secondary | ICD-10-CM | POA: Diagnosis not present

## 2016-07-14 DIAGNOSIS — N183 Chronic kidney disease, stage 3 (moderate): Secondary | ICD-10-CM | POA: Diagnosis not present

## 2016-07-14 DIAGNOSIS — Z9221 Personal history of antineoplastic chemotherapy: Secondary | ICD-10-CM | POA: Diagnosis not present

## 2016-07-14 DIAGNOSIS — E1122 Type 2 diabetes mellitus with diabetic chronic kidney disease: Secondary | ICD-10-CM | POA: Diagnosis present

## 2016-07-14 DIAGNOSIS — C3490 Malignant neoplasm of unspecified part of unspecified bronchus or lung: Secondary | ICD-10-CM | POA: Diagnosis not present

## 2016-07-14 DIAGNOSIS — M549 Dorsalgia, unspecified: Secondary | ICD-10-CM

## 2016-07-14 DIAGNOSIS — S299XXA Unspecified injury of thorax, initial encounter: Secondary | ICD-10-CM | POA: Diagnosis not present

## 2016-07-14 DIAGNOSIS — Z923 Personal history of irradiation: Secondary | ICD-10-CM | POA: Diagnosis not present

## 2016-07-14 DIAGNOSIS — Z85118 Personal history of other malignant neoplasm of bronchus and lung: Secondary | ICD-10-CM | POA: Diagnosis not present

## 2016-07-14 DIAGNOSIS — E860 Dehydration: Secondary | ICD-10-CM | POA: Diagnosis not present

## 2016-07-14 DIAGNOSIS — Z7984 Long term (current) use of oral hypoglycemic drugs: Secondary | ICD-10-CM

## 2016-07-14 DIAGNOSIS — Z90710 Acquired absence of both cervix and uterus: Secondary | ICD-10-CM

## 2016-07-14 DIAGNOSIS — M5442 Lumbago with sciatica, left side: Secondary | ICD-10-CM | POA: Insufficient documentation

## 2016-07-14 DIAGNOSIS — N170 Acute kidney failure with tubular necrosis: Principal | ICD-10-CM | POA: Diagnosis present

## 2016-07-14 DIAGNOSIS — M5136 Other intervertebral disc degeneration, lumbar region: Secondary | ICD-10-CM | POA: Diagnosis not present

## 2016-07-14 DIAGNOSIS — I700 Atherosclerosis of aorta: Secondary | ICD-10-CM | POA: Insufficient documentation

## 2016-07-14 DIAGNOSIS — Z87891 Personal history of nicotine dependence: Secondary | ICD-10-CM

## 2016-07-14 DIAGNOSIS — Z79899 Other long term (current) drug therapy: Secondary | ICD-10-CM

## 2016-07-14 DIAGNOSIS — Z823 Family history of stroke: Secondary | ICD-10-CM

## 2016-07-14 DIAGNOSIS — Z8249 Family history of ischemic heart disease and other diseases of the circulatory system: Secondary | ICD-10-CM

## 2016-07-14 DIAGNOSIS — M4696 Unspecified inflammatory spondylopathy, lumbar region: Secondary | ICD-10-CM | POA: Diagnosis not present

## 2016-07-14 DIAGNOSIS — R531 Weakness: Secondary | ICD-10-CM

## 2016-07-14 DIAGNOSIS — S3992XA Unspecified injury of lower back, initial encounter: Secondary | ICD-10-CM | POA: Diagnosis not present

## 2016-07-14 DIAGNOSIS — R270 Ataxia, unspecified: Secondary | ICD-10-CM | POA: Diagnosis present

## 2016-07-14 DIAGNOSIS — M545 Low back pain: Secondary | ICD-10-CM | POA: Diagnosis not present

## 2016-07-14 LAB — CBC WITH DIFFERENTIAL/PLATELET
Basophils Absolute: 0 10*3/uL (ref 0–0.1)
Basophils Relative: 0 %
EOS PCT: 2 %
Eosinophils Absolute: 0.1 10*3/uL (ref 0–0.7)
HEMATOCRIT: 28.9 % — AB (ref 35.0–47.0)
Hemoglobin: 9.9 g/dL — ABNORMAL LOW (ref 12.0–16.0)
LYMPHS ABS: 0.8 10*3/uL — AB (ref 1.0–3.6)
Lymphocytes Relative: 13 %
MCH: 32.9 pg (ref 26.0–34.0)
MCHC: 34.1 g/dL (ref 32.0–36.0)
MCV: 96.4 fL (ref 80.0–100.0)
MONOS PCT: 9 %
Monocytes Absolute: 0.5 10*3/uL (ref 0.2–0.9)
Neutro Abs: 4.4 10*3/uL (ref 1.4–6.5)
Neutrophils Relative %: 76 %
PLATELETS: 234 10*3/uL (ref 150–440)
RBC: 2.99 MIL/uL — AB (ref 3.80–5.20)
RDW: 15.4 % — ABNORMAL HIGH (ref 11.5–14.5)
WBC: 5.9 10*3/uL (ref 3.6–11.0)

## 2016-07-14 LAB — BASIC METABOLIC PANEL
Anion gap: 9 (ref 5–15)
BUN: 48 mg/dL — ABNORMAL HIGH (ref 6–20)
CALCIUM: 9.4 mg/dL (ref 8.9–10.3)
CO2: 26 mmol/L (ref 22–32)
CREATININE: 2.75 mg/dL — AB (ref 0.44–1.00)
Chloride: 102 mmol/L (ref 101–111)
GFR calc non Af Amer: 16 mL/min — ABNORMAL LOW (ref >60)
GFR, EST AFRICAN AMERICAN: 18 mL/min — AB (ref >60)
Glucose, Bld: 220 mg/dL — ABNORMAL HIGH (ref 65–99)
Potassium: 4.5 mmol/L (ref 3.5–5.1)
SODIUM: 137 mmol/L (ref 135–145)

## 2016-07-14 LAB — URINALYSIS, COMPLETE (UACMP) WITH MICROSCOPIC
BILIRUBIN URINE: NEGATIVE
Bacteria, UA: NONE SEEN
GLUCOSE, UA: NEGATIVE mg/dL
HGB URINE DIPSTICK: NEGATIVE
KETONES UR: NEGATIVE mg/dL
LEUKOCYTES UA: NEGATIVE
NITRITE: NEGATIVE
PH: 5 (ref 5.0–8.0)
Protein, ur: NEGATIVE mg/dL
SPECIFIC GRAVITY, URINE: 1.012 (ref 1.005–1.030)

## 2016-07-14 MED ORDER — SODIUM CHLORIDE 0.9 % IV BOLUS (SEPSIS)
1000.0000 mL | Freq: Once | INTRAVENOUS | Status: AC
  Administered 2016-07-14: 1000 mL via INTRAVENOUS

## 2016-07-14 NOTE — ED Triage Notes (Signed)
Patient presents to ER with c/o weakness, sleeping a lot, no energy, trouble ambulating. A&O x4 HX lung cancer

## 2016-07-14 NOTE — ED Provider Notes (Signed)
[facility] Emergency Department Provider Note    First MD Initiated Contact with Patient 07/14/16 1832     (approximate)  I have reviewed the triage vital signs and the nursing notes.   HISTORY  Chief Complaint Back Pain    HPI [name] [name] is a 76 y.o. female with history of lung cancer status post chemotherapy currently in remission presents with 1 week of worsening weakness and difficulty ambulating associated with bilateral numbness and tingling to her legs. Has been doing with worsening back pain for several weeks. States that the weakness and tingling is new however. Denies any fevers. Has had a normal appetite. States that she has felt like this before when she had bad dehydration and her kidney function worsened. Was recently started on Zithromax and as an outpatient for cough.    Past Medical History:  Diagnosis Date  . Abnormal CT lung screening 10/17/2015  . COPD (chronic obstructive pulmonary disease) ([place])   . Coronary artery disease, non-occlusive    a. cath 2006: min nonobs CAD; b. cath 12/2010: cath LAD 50%, RCA 60%; c. 08/2013: Minimal luminal irregs, right dominant system with no significant CAD, diffuse luminal irregs noted. Normal EF 55%, no AS or MS.   . Diabetes mellitus   . Hyperlipemia    Followed by Dr. Rosanna Randy  . Hypertension   . Lung cancer ([place])   . Macular degeneration    rt  . Personal history of tobacco use, presenting hazards to health 10/15/2015  . Pneumonia    hx  . Shortness of breath   . Stroke [facility])    Family History  Problem Relation Age of Onset  . Stroke Mother     Massive  . Heart attack Father     Massive  . Heart attack Brother   . Heart attack Brother   . Lung cancer Maternal Grandfather   . Heart attack Paternal Grandmother     MI   Past Surgical History:  Procedure Laterality Date  . ABDOMINAL HYSTERECTOMY    . ANTERIOR CERVICAL DECOMP/DISCECTOMY FUSION N/A 10/19/2013   Procedure: CERVICAL  FIVE-SIX ANTERIOR CERVICAL DECOMPRESSION WITH FUSION INTERBODY PROSTHESIS PLATING AND PEEK CAGE;  Surgeon: Ophelia Charter, MD;  Location: [facility] NEURO ORS;  Service: Neurosurgery;  Laterality: N/A;  . BACK SURGERY  80's  . BREAST CYST EXCISION Left    left negative   . CARDIAC CATHETERIZATION  05/2004  . CATARACT EXTRACTION Left   . CHOLECYSTECTOMY    . ENDARTERECTOMY Left 10/24/2014   Procedure: ENDARTERECTOMY CAROTID;  Surgeon: Algernon Huxley, MD;  Location: [facility] ORS;  Service: Vascular;  Laterality: Left;  . ENDOBRONCHIAL ULTRASOUND N/A 11/14/2015   Procedure: ENDOBRONCHIAL ULTRASOUND;  Surgeon: [name], MD;  Location: [facility] ORS;  Service: Cardiopulmonary;  Laterality: N/A;  . PERIPHERAL VASCULAR CATHETERIZATION N/A 12/04/2015   Procedure: Glori Luis Cath Insertion;  Surgeon: Algernon Huxley, MD;  Location: [place] CV LAB;  Service: Cardiovascular;  Laterality: N/A;  . TONSILLECTOMY AND ADENOIDECTOMY    . VESICOVAGINAL FISTULA CLOSURE W/ TAH     Patient Active Problem List   Diagnosis Date Noted  . Protein-calorie malnutrition, severe 02/08/2016  . Acute renal failure (ARF) ([place]) 02/05/2016  . Primary cancer of right upper lobe of lung ([place]) 11/20/2015  . Adenopathy   . Abnormal CT lung screening 10/17/2015  . Personal history of tobacco use, presenting hazards to health 10/15/2015  . NSTEMI (non-ST elevated myocardial infarction) ([place]) 01/27/2015  . Chronic  vulvitis 09/26/2014  . Allergic reaction 09/26/2014  . Carotid stenosis 08/30/2014  . Cervical nerve root disorder 08/10/2014  . Absolute anemia 08/10/2014  . CAD in native artery 08/10/2014  . B12 deficiency 08/10/2014  . Back ache 08/10/2014  . Bronchitis, chronic ([place]) 08/10/2014  . Diabetes mellitus with polyneuropathy ([place]) 08/10/2014  . Can't get food down 08/10/2014  . Eczema of external ear 08/10/2014  . Accumulation of fluid in tissues 08/10/2014  . Gout 08/10/2014  . Adult hypothyroidism 08/10/2014  . Mononeuritis  08/10/2014  . Muscle ache 08/10/2014  . Disorder of peripheral nervous system ([place]) 08/10/2014  . Lesion of vulva 08/10/2014  . Cervical spondylosis with radiculopathy 10/19/2013  . Unstable angina ([place]) 08/15/2013  . COPD exacerbation ([place]) 03/29/2013  . CAD (coronary artery disease) 06/22/2011  . COPD (chronic obstructive pulmonary disease) with emphysema ([place]) 03/07/2010  . CHEST PAIN UNSPECIFIED 07/22/2009  . HLD (hyperlipidemia) 04/01/2009  . Malaise and fatigue 04/01/2009  . Hyperlipidemia 01/18/2009  . TOBACCO ABUSE 01/18/2009  . HYPERTENSION, BENIGN 01/18/2009  . CLAUDICATION 01/18/2009  . Pain in limb 01/18/2009  . CAFL (chronic airflow limitation) ([place]) 01/20/2007  . Late effects of cerebrovascular disease 01/10/2007  . Essential (primary) hypertension 12/22/2006      Prior to Admission medications   Medication Sig Start Date End Date Taking? Authorizing Provider  albuterol (PROVENTIL) (5 MG/ML) 0.5% nebulizer solution Take 2.5 mg by nebulization 2 (two) times daily.   Yes Historical Provider, MD  atorvastatin (LIPITOR) 20 MG tablet Take 1 tablet by mouth at  bedtime 09/04/15  Yes Richard Maceo Pro., MD  budesonide-formoterol [facility]) 160-4.5 MCG/ACT inhaler Inhale 2 puffs into the lungs 2 (two) times daily. 12/06/14  Yes [name], MD  diazepam (VALIUM) 5 MG tablet Take 1 tablet (5 mg total) by mouth every 6 (six) hours as needed for muscle spasms. 07/07/16  Yes Carmon Ginsberg, PA  gabapentin (NEURONTIN) 600 MG tablet TAKE 1 TABLET BY MOUTH TWO  TIMES DAILY 07/13/16  Yes Richard Maceo Pro., MD  glimepiride (AMARYL) 2 MG tablet Take 1 tablet (2 mg total) by mouth 2 (two) times daily. 06/01/16  Yes Richard Maceo Pro., MD  isosorbide mononitrate (IMDUR) 30 MG 24 hr tablet Take 1 tablet by mouth  daily 09/04/15  Yes [name], MD  lidocaine-prilocaine (EMLA) cream Apply to port 1-2 hours prior to chemotherapy appointment. Cover with plastic wrap. 12/05/15  Yes  Lloyd Huger, MD  magnesium oxide (MAG-OX) 400 (241.3 MG) MG tablet Take 1 tablet by mouth 2 (two) times daily. 08/04/13  Yes Historical Provider, MD  metFORMIN (GLUCOPHAGE) 1000 MG tablet Take 1 tablet by mouth  every day 10/10/15  Yes Richard Maceo Pro., MD  metoprolol tartrate (LOPRESSOR) 25 MG tablet Take 1 tablet (25 mg total) by mouth 2 (two) times daily. 06/01/16  Yes Richard Maceo Pro., MD  Multiple Vitamins-Minerals (ICAPS) CAPS Take 2 capsules by mouth daily. Reported on 04/17/2015   Yes Historical Provider, MD  nitroGLYCERIN (NITROSTAT) 0.4 MG SL tablet Place 1 tablet (0.4 mg total) under the tongue every 5 (five) minutes as needed for chest pain. 07/23/14  Yes [name], MD  ondansetron (ZOFRAN) 8 MG tablet Take 1 tablet (8 mg total) by mouth every 8 (eight) hours as needed for nausea or vomiting. 02/13/16  Yes Gladstone Lighter, MD  pantoprazole (PROTONIX) 40 MG tablet Take 1 tablet by mouth  daily 09/04/15  Yes Richard Maceo Pro., MD  pioglitazone (ACTOS) 30 MG tablet Take 1 tablet (30 mg total) by mouth daily. 04/30/16  Yes Richard Maceo Pro., MD  potassium chloride (K-DUR) 10 MEQ tablet Take 3 tablets by mouth  daily as directed Patient taking differently: Take 1 tablet by mouth 3 times daily 01/11/15  Yes Richard Maceo Pro., MD  prochlorperazine (COMPAZINE) 10 MG tablet Take 1 tablet (10 mg total) by mouth every 6 (six) hours as needed (Nausea or vomiting). 12/05/15  Yes Lloyd Huger, MD  azithromycin (ZITHROMAX Z-PAK) 250 MG tablet Take two pills the first day then one pill daily Patient not taking: Reported on 07/14/2016 07/07/16   Carmon Ginsberg, PA  glimepiride (AMARYL) 4 MG tablet TAKE 1 TABLET BY MOUTH TWO  TIMES DAILY 07/13/16   Jerrol Banana., MD    Allergies Coconut fatty acids    Social History Social History  Substance Use Topics  . Smoking status: Former Smoker    Packs/day: 1.00    Years: 50.00    Types: Cigarettes    Quit date:  10/03/2015  . Smokeless tobacco: Never Used     Comment: smokes 3 cigs daily 05/06/15. Pt instructed to quit.  . Alcohol use No    Review of Systems Patient denies headaches, rhinorrhea, blurry vision, numbness, shortness of breath, chest pain, edema, cough, abdominal pain, nausea, vomiting, diarrhea, dysuria, fevers, rashes or hallucinations unless otherwise stated above in HPI. ____________________________________________   PHYSICAL EXAM:  VITAL SIGNS: Vitals:   07/14/16 2135 07/14/16 2253  BP:  (!) 132/57  Pulse: 79 81  Resp: 17 17  Temp:      Constitutional: Alert and oriented. ill appearing and in no acute distress. Eyes: Conjunctivae are normal. PERRL. EOMI. Head: Atraumatic. Nose: No congestion/rhinnorhea. Mouth/Throat: Mucous membranes are moist.  Oropharynx non-erythematous. Neck: No stridor. Painless ROM. No cervical spine tenderness to palpation Hematological/Lymphatic/Immunilogical: No cervical lymphadenopathy. Cardiovascular: Normal rate, regular rhythm. Grossly normal heart sounds.  Good peripheral circulation. Respiratory: Normal respiratory effort.  No retractions. Lungs CTAB. Gastrointestinal: Soft and nontender. No distention. No abdominal bruits. No CVA tenderness. Musculoskeletal: No lower extremity tenderness,  Midline lumbar ttp.  Diffuse LE weakness with planter flexion, knee flexion and extension.  Subjective tingling to bilateral thighs.  No saddle anesthesia Neurologic:  Normal speech and language. No gross focal neurologic deficits are appreciated. No gait instability. Skin:  Skin is warm, dry and intact. No rash noted. Psychiatric: Mood and affect are normal. Speech and behavior are normal.  ____________________________________________   LABS (all labs ordered are listed, but only abnormal results are displayed)  Results for orders placed or performed during the hospital encounter of 07/14/16 (from the past 24 hour(s))  Basic metabolic panel      Status: Abnormal   Collection Time: 07/14/16  4:17 PM  Result Value Ref Range   Sodium 137 135 - 145 mmol/L   Potassium 4.5 3.5 - 5.1 mmol/L   Chloride 102 101 - 111 mmol/L   CO2 26 22 - 32 mmol/L   Glucose, Bld 220 (H) 65 - 99 mg/dL   BUN 48 (H) 6 - 20 mg/dL   Creatinine, Ser 2.75 (H) 0.44 - 1.00 mg/dL   Calcium 9.4 8.9 - 10.3 mg/dL   GFR calc non Af Amer 16 (L) >60 mL/min   GFR calc Af Amer 18 (L) >60 mL/min   Anion gap 9 5 - 15  CBC with Differential     Status: Abnormal   Collection Time: 07/14/16  4:17 PM  Result Value Ref Range   WBC 5.9 3.6 - 11.0 K/uL   RBC 2.99 (L) 3.80 - 5.20 MIL/uL   Hemoglobin 9.9 (L) 12.0 - 16.0 g/dL   HCT 28.9 (L) 35.0 - 47.0 %   MCV 96.4 80.0 - 100.0 fL   MCH 32.9 26.0 - 34.0 pg   MCHC 34.1 32.0 - 36.0 g/dL   RDW 15.4 (H) 11.5 - 14.5 %   Platelets 234 150 - 440 K/uL   Neutrophils Relative % 76 %   Neutro Abs 4.4 1.4 - 6.5 K/uL   Lymphocytes Relative 13 %   Lymphs Abs 0.8 (L) 1.0 - 3.6 K/uL   Monocytes Relative 9 %   Monocytes Absolute 0.5 0.2 - 0.9 K/uL   Eosinophils Relative 2 %   Eosinophils Absolute 0.1 0 - 0.7 K/uL   Basophils Relative 0 %   Basophils Absolute 0.0 0 - 0.1 K/uL  Urinalysis, Complete w Microscopic     Status: Abnormal   Collection Time: 07/14/16 10:37 PM  Result Value Ref Range   Color, Urine YELLOW (A) YELLOW   APPearance CLEAR (A) CLEAR   Specific Gravity, Urine 1.012 1.005 - 1.030   pH 5.0 5.0 - 8.0   Glucose, UA NEGATIVE NEGATIVE mg/dL   Hgb urine dipstick NEGATIVE NEGATIVE   Bilirubin Urine NEGATIVE NEGATIVE   Ketones, ur NEGATIVE NEGATIVE mg/dL   Protein, ur NEGATIVE NEGATIVE mg/dL   Nitrite NEGATIVE NEGATIVE   Leukocytes, UA NEGATIVE NEGATIVE   RBC / HPF 0-5 0 - 5 RBC/hpf   WBC, UA 0-5 0 - 5 WBC/hpf   Bacteria, UA NONE SEEN NONE SEEN   Squamous Epithelial / LPF 0-5 (A) NONE SEEN   ____________________________________________  EKG My review and personal interpretation at Time: 16:15   Indication:  weakness  Rate: 80  Rhythm: sinus Axis: normal Other: normal intervals, non specific st changes ____________________________________________  RADIOLOGY  I personally reviewed all radiographic images ordered to evaluate for the above acute complaints and reviewed radiology reports and findings.  These findings were personally discussed with the patient.  Please see medical record for radiology report.  ____________________________________________   PROCEDURES  Procedure(s) performed:  Procedures    Critical Care performed: no ____________________________________________   INITIAL IMPRESSION / ASSESSMENT AND PLAN / ED COURSE  Pertinent labs & imaging results that were available during my care of the patient were reviewed by me and considered in my medical decision making (see chart for details).  DDX: dehydration, aki, electrolyte abnormality, malignancy, CE, cord compression, uti  Vlada D Parcel is a 76 y.o. who presents to the ED with weakness and back. As described above. Patient with no fever and otherwise hemodynamically stable. Physical exam as described above. Laboratory evaluation does show evidence of acute kidney injury. Symptoms may be secondary to profound dehydration but in the setting of her pain with worsening weakness and exam as above with history of lung cancer do feel that MRI is clinically indicated to evaluate for any evidence of cord compression.  The patient will be placed on continuous pulse oximetry and telemetry for monitoring.  Laboratory evaluation will be sent to evaluate for the above complaints.     Clinical Course as of Jul 14 2345  Tue Jul 14, 2016  2120 MRI shows no evidence of mass lesion. Patient says symptoms are mildly improved. [PR]    Clinical Course User Index [PR] Merlyn Lot, MD   ----------------------------------------- 11:48 PM on 07/14/2016 -----------------------------------------  Ultrasound without any evidence of  significant edema. Based on a K I do feel patient will require admission and she still very weak and unable to ambulate.  No evidence of infection. Discussed case with Dr. Jannifer Franklin who agrees to admit patient.   Have discussed with the patient and available family all diagnostics and treatments performed thus far and all questions were answered to the best of my ability. The patient demonstrates understanding and agreement with plan.  ____________________________________________   FINAL CLINICAL IMPRESSION(S) / ED DIAGNOSES  Final diagnoses:  Back pain  Acute kidney injury ([place])  Weakness      NEW MEDICATIONS STARTED DURING THIS VISIT:  New Prescriptions   No medications on file     Note:  This document was prepared using Dragon voice recognition software and may include unintentional dictation errors.    Merlyn Lot, MD 07/15/16 [phone]

## 2016-07-14 NOTE — ED Notes (Signed)
Pt states increased weakness over past few days, hard to walk. [place] a couple weeks ago. Husband states lethargy and slowness to speech. Pt has L sided chest port. Pt alert, oriented, no distress noted at this time.

## 2016-07-14 NOTE — ED Notes (Signed)
Patient placed on bedpan. She is unable to go at this moment. Will try again.

## 2016-07-15 ENCOUNTER — Ambulatory Visit: Payer: Medicare Other | Admitting: Family Medicine

## 2016-07-15 DIAGNOSIS — Z7951 Long term (current) use of inhaled steroids: Secondary | ICD-10-CM | POA: Diagnosis not present

## 2016-07-15 DIAGNOSIS — Z85118 Personal history of other malignant neoplasm of bronchus and lung: Secondary | ICD-10-CM | POA: Diagnosis not present

## 2016-07-15 DIAGNOSIS — Z90710 Acquired absence of both cervix and uterus: Secondary | ICD-10-CM | POA: Diagnosis not present

## 2016-07-15 DIAGNOSIS — Z87891 Personal history of nicotine dependence: Secondary | ICD-10-CM | POA: Diagnosis not present

## 2016-07-15 DIAGNOSIS — M4696 Unspecified inflammatory spondylopathy, lumbar region: Secondary | ICD-10-CM | POA: Diagnosis present

## 2016-07-15 DIAGNOSIS — Z823 Family history of stroke: Secondary | ICD-10-CM | POA: Diagnosis not present

## 2016-07-15 DIAGNOSIS — Z981 Arthrodesis status: Secondary | ICD-10-CM | POA: Diagnosis not present

## 2016-07-15 DIAGNOSIS — R270 Ataxia, unspecified: Secondary | ICD-10-CM | POA: Diagnosis present

## 2016-07-15 DIAGNOSIS — Z7984 Long term (current) use of oral hypoglycemic drugs: Secondary | ICD-10-CM | POA: Diagnosis not present

## 2016-07-15 DIAGNOSIS — N179 Acute kidney failure, unspecified: Secondary | ICD-10-CM | POA: Diagnosis present

## 2016-07-15 DIAGNOSIS — Z923 Personal history of irradiation: Secondary | ICD-10-CM | POA: Diagnosis not present

## 2016-07-15 DIAGNOSIS — M5136 Other intervertebral disc degeneration, lumbar region: Secondary | ICD-10-CM | POA: Diagnosis present

## 2016-07-15 DIAGNOSIS — Z801 Family history of malignant neoplasm of trachea, bronchus and lung: Secondary | ICD-10-CM | POA: Diagnosis not present

## 2016-07-15 DIAGNOSIS — Z9221 Personal history of antineoplastic chemotherapy: Secondary | ICD-10-CM | POA: Diagnosis not present

## 2016-07-15 DIAGNOSIS — Z79899 Other long term (current) drug therapy: Secondary | ICD-10-CM | POA: Diagnosis not present

## 2016-07-15 DIAGNOSIS — E785 Hyperlipidemia, unspecified: Secondary | ICD-10-CM | POA: Diagnosis present

## 2016-07-15 DIAGNOSIS — E860 Dehydration: Secondary | ICD-10-CM | POA: Diagnosis present

## 2016-07-15 DIAGNOSIS — Z9049 Acquired absence of other specified parts of digestive tract: Secondary | ICD-10-CM | POA: Diagnosis not present

## 2016-07-15 DIAGNOSIS — N170 Acute kidney failure with tubular necrosis: Secondary | ICD-10-CM | POA: Diagnosis present

## 2016-07-15 DIAGNOSIS — Z9889 Other specified postprocedural states: Secondary | ICD-10-CM | POA: Diagnosis not present

## 2016-07-15 DIAGNOSIS — Z8249 Family history of ischemic heart disease and other diseases of the circulatory system: Secondary | ICD-10-CM | POA: Diagnosis not present

## 2016-07-15 DIAGNOSIS — N1330 Unspecified hydronephrosis: Secondary | ICD-10-CM | POA: Diagnosis present

## 2016-07-15 DIAGNOSIS — I2510 Atherosclerotic heart disease of native coronary artery without angina pectoris: Secondary | ICD-10-CM | POA: Diagnosis present

## 2016-07-15 DIAGNOSIS — N183 Chronic kidney disease, stage 3 (moderate): Secondary | ICD-10-CM | POA: Diagnosis present

## 2016-07-15 DIAGNOSIS — E1122 Type 2 diabetes mellitus with diabetic chronic kidney disease: Secondary | ICD-10-CM | POA: Diagnosis present

## 2016-07-15 DIAGNOSIS — I129 Hypertensive chronic kidney disease with stage 1 through stage 4 chronic kidney disease, or unspecified chronic kidney disease: Secondary | ICD-10-CM | POA: Diagnosis present

## 2016-07-15 LAB — GLUCOSE, CAPILLARY
Glucose-Capillary: 99 mg/dL (ref 65–99)
Glucose-Capillary: 139 mg/dL — ABNORMAL HIGH (ref 65–99)
Glucose-Capillary: 179 mg/dL — ABNORMAL HIGH (ref 65–99)
Glucose-Capillary: 169 mg/dL — ABNORMAL HIGH (ref 65–99)

## 2016-07-15 LAB — TSH: TSH: 3.136 u[IU]/mL (ref 0.350–4.500)

## 2016-07-15 MED ORDER — ACETAMINOPHEN 325 MG PO TABS: 650.0000 mg | ORAL_TABLET | Freq: Four times a day (QID) | ORAL | Status: DC | PRN

## 2016-07-15 MED ORDER — MOMETASONE FURO-FORMOTEROL FUM 200-5 MCG/ACT IN AERO
2.0000 | INHALATION_SPRAY | Freq: Two times a day (BID) | RESPIRATORY_TRACT | Status: DC
  Administered 2016-07-15 – 2016-07-16 (×3): 2 via RESPIRATORY_TRACT
  Filled 2016-07-15: qty 8.8

## 2016-07-15 MED ORDER — PANTOPRAZOLE SODIUM 40 MG PO TBEC
40.0000 mg | DELAYED_RELEASE_TABLET | Freq: Every day | ORAL | Status: DC
  Administered 2016-07-15 – 2016-07-16 (×2): 40 mg via ORAL
  Filled 2016-07-15 (×2): qty 1

## 2016-07-15 MED ORDER — METHYLPREDNISOLONE 4 MG PO TBPK
8.0000 mg | ORAL_TABLET | Freq: Every morning | ORAL | Status: AC
  Filled 2016-07-15: qty 21

## 2016-07-15 MED ORDER — HEPARIN SODIUM (PORCINE) 5000 UNIT/ML IJ SOLN
5000.0000 [IU] | Freq: Three times a day (TID) | INTRAMUSCULAR | Status: DC
  Administered 2016-07-15 – 2016-07-16 (×5): 5000 [IU] via SUBCUTANEOUS
  Filled 2016-07-15 (×5): qty 1

## 2016-07-15 MED ORDER — MAGNESIUM OXIDE 400 (241.3 MG) MG PO TABS
400.0000 mg | ORAL_TABLET | Freq: Two times a day (BID) | ORAL | Status: DC
  Administered 2016-07-15 – 2016-07-16 (×3): 400 mg via ORAL
  Filled 2016-07-15 (×3): qty 1

## 2016-07-15 MED ORDER — GABAPENTIN 600 MG PO TABS: 600.0000 mg | ORAL_TABLET | Freq: Two times a day (BID) | ORAL | Status: DC

## 2016-07-15 MED ORDER — METHYLPREDNISOLONE 4 MG PO TBPK: 4.0000 mg | ORAL_TABLET | ORAL | Status: DC

## 2016-07-15 MED ORDER — DOCUSATE SODIUM 100 MG PO CAPS
100.0000 mg | ORAL_CAPSULE | Freq: Two times a day (BID) | ORAL | Status: DC
  Administered 2016-07-15 – 2016-07-16 (×3): 100 mg via ORAL
  Filled 2016-07-15 (×3): qty 1

## 2016-07-15 MED ORDER — ACETAMINOPHEN 650 MG RE SUPP: 650.0000 mg | Freq: Four times a day (QID) | RECTAL | Status: DC | PRN

## 2016-07-15 MED ORDER — INSULIN ASPART 100 UNIT/ML ~~LOC~~ SOLN: 0.0000 [IU] | Freq: Every day | SUBCUTANEOUS | Status: DC

## 2016-07-15 MED ORDER — METHYLPREDNISOLONE 4 MG PO TBPK: 8.0000 mg | ORAL_TABLET | Freq: Every evening | ORAL | Status: DC

## 2016-07-15 MED ORDER — OCUVITE-LUTEIN PO CAPS
2.0000 | ORAL_CAPSULE | Freq: Every day | ORAL | Status: DC
  Administered 2016-07-15 – 2016-07-16 (×2): 2 via ORAL
  Filled 2016-07-15 (×2): qty 2

## 2016-07-15 MED ORDER — ISOSORBIDE MONONITRATE ER 30 MG PO TB24
30.0000 mg | ORAL_TABLET | Freq: Every day | ORAL | Status: DC
  Administered 2016-07-15 – 2016-07-16 (×2): 30 mg via ORAL
  Filled 2016-07-15 (×2): qty 1

## 2016-07-15 MED ORDER — INSULIN ASPART 100 UNIT/ML ~~LOC~~ SOLN
0.0000 [IU] | Freq: Three times a day (TID) | SUBCUTANEOUS | Status: DC
  Administered 2016-07-15: 17:00:00 3 [IU] via SUBCUTANEOUS
  Administered 2016-07-15: 2 [IU] via SUBCUTANEOUS
  Administered 2016-07-16: 5 [IU] via SUBCUTANEOUS
  Administered 2016-07-16: 09:00:00 3 [IU] via SUBCUTANEOUS
  Filled 2016-07-15: qty 5
  Filled 2016-07-15: qty 2
  Filled 2016-07-15 (×2): qty 3

## 2016-07-15 MED ORDER — SODIUM CHLORIDE 0.9% FLUSH: 10.0000 mL | INTRAVENOUS | Status: DC | PRN

## 2016-07-15 MED ORDER — GABAPENTIN 600 MG PO TABS
300.0000 mg | ORAL_TABLET | Freq: Two times a day (BID) | ORAL | Status: DC
Start: 2016-07-15 — End: 2016-07-16
  Administered 2016-07-15 – 2016-07-16 (×3): 300 mg via ORAL
  Filled 2016-07-15 (×3): qty 1

## 2016-07-15 MED ORDER — ONDANSETRON HCL 4 MG PO TABS: 4.0000 mg | ORAL_TABLET | Freq: Four times a day (QID) | ORAL | Status: DC | PRN

## 2016-07-15 MED ORDER — NITROGLYCERIN 0.4 MG SL SUBL: 0.4000 mg | SUBLINGUAL_TABLET | SUBLINGUAL | Status: DC | PRN

## 2016-07-15 MED ORDER — DIAZEPAM 5 MG PO TABS
5.0000 mg | ORAL_TABLET | Freq: Four times a day (QID) | ORAL | Status: DC | PRN
  Administered 2016-07-15 (×2): 5 mg via ORAL
  Filled 2016-07-15 (×2): qty 1

## 2016-07-15 MED ORDER — METOPROLOL TARTRATE 25 MG PO TABS
25.0000 mg | ORAL_TABLET | Freq: Two times a day (BID) | ORAL | Status: DC
  Administered 2016-07-15 – 2016-07-16 (×3): 25 mg via ORAL
  Filled 2016-07-15 (×3): qty 1

## 2016-07-15 MED ORDER — POTASSIUM CHLORIDE CRYS ER 10 MEQ PO TBCR
10.0000 meq | EXTENDED_RELEASE_TABLET | Freq: Three times a day (TID) | ORAL | Status: DC
Start: 2016-07-15 — End: 2016-07-16
  Administered 2016-07-15 – 2016-07-16 (×4): 10 meq via ORAL
  Filled 2016-07-15 (×8): qty 1

## 2016-07-15 MED ORDER — ATORVASTATIN CALCIUM 20 MG PO TABS
20.0000 mg | ORAL_TABLET | Freq: Every day | ORAL | Status: DC
  Administered 2016-07-15: 20 mg via ORAL
  Filled 2016-07-15: qty 1

## 2016-07-15 MED ORDER — METHYLPREDNISOLONE 4 MG PO TBPK
4.0000 mg | ORAL_TABLET | ORAL | Status: AC
  Administered 2016-07-15: 17:00:00 4 mg via ORAL

## 2016-07-15 MED ORDER — SODIUM CHLORIDE 0.9 % IV SOLN
INTRAVENOUS | Status: DC
  Administered 2016-07-15 – 2016-07-16 (×4): via INTRAVENOUS

## 2016-07-15 MED ORDER — METHYLPREDNISOLONE 4 MG PO TBPK
4.0000 mg | ORAL_TABLET | Freq: Three times a day (TID) | ORAL | Status: DC
  Administered 2016-07-16 (×2): 4 mg via ORAL

## 2016-07-15 MED ORDER — METHYLPREDNISOLONE 4 MG PO TBPK: 4.0000 mg | ORAL_TABLET | Freq: Four times a day (QID) | ORAL | Status: DC

## 2016-07-15 MED ORDER — ONDANSETRON HCL 4 MG/2ML IJ SOLN: 4.0000 mg | Freq: Four times a day (QID) | INTRAMUSCULAR | Status: DC | PRN

## 2016-07-15 MED ORDER — ALBUTEROL SULFATE (2.5 MG/3ML) 0.083% IN NEBU: 2.5000 mg | INHALATION_SOLUTION | RESPIRATORY_TRACT | Status: DC | PRN

## 2016-07-15 NOTE — ED Notes (Signed)
Patient continues to sleep soundly at this time.

## 2016-07-15 NOTE — H&P (Signed)
[name] [name] is an 76 y.o. female.   Chief Complaint: Back pain HPI: The patient with past medical history of lung cancer, coronary artery disease, hypertension and diabetes presents emergency department complaining of back pain. This is a chronic problem that has worsened over the last week. She admits to generalized weakness but also admits to difficulty walking. She denies weakness in one leg more than the other. She also denies loss of continence of bowel or bladder. An MRI of her back was obtained in the emergency department which showed foraminal narrowing particularly around L5. Laboratory evaluation revealed acute kidney injury. The patient admits to some nausea and vomiting. She denies diarrhea. The patient completed chemotherapy 6 months ago but has not yet regained her strength. Thus, the emergency [facility] staff was called the hospitalist service for admission.  Past Medical History:  Diagnosis Date  . Abnormal CT lung screening 10/17/2015  . COPD (chronic obstructive pulmonary disease) ([place])   . Coronary artery disease, non-occlusive    a. cath 2006: min nonobs CAD; b. cath 12/2010: cath LAD 50%, RCA 60%; c. 08/2013: Minimal luminal irregs, right dominant system with no significant CAD, diffuse luminal irregs noted. Normal EF 55%, no AS or MS.   . Diabetes mellitus   . Hyperlipemia    Followed by Dr. Rosanna Randy  . Hypertension   . Lung cancer ([place])   . Macular degeneration    rt  . Personal history of tobacco use, presenting hazards to health 10/15/2015  . Pneumonia    hx  . Shortness of breath   . Stroke [facility])     Past Surgical History:  Procedure Laterality Date  . ABDOMINAL HYSTERECTOMY    . ANTERIOR CERVICAL DECOMP/DISCECTOMY FUSION N/A 10/19/2013   Procedure: CERVICAL FIVE-SIX ANTERIOR CERVICAL DECOMPRESSION WITH FUSION INTERBODY PROSTHESIS PLATING AND PEEK CAGE;  Surgeon: Ophelia Charter, MD;  Location: [facility] OR[facility];  Service: Neurosurgery;  Laterality: N/A;  . BACK SURGERY   80's  . BREAST CYST EXCISION Left    left negative   . CARDIAC CATHETERIZATION  05/2004  . CATARACT EXTRACTION Left   . CHOLECYSTECTOMY    . ENDARTERECTOMY Left 10/24/2014   Procedure: ENDARTERECTOMY CAROTID;  Surgeon: Algernon Huxley, MD;  Location: [facility] ORS;  Service: Vascular;  Laterality: Left;  . ENDOBRONCHIAL ULTRASOUND N/A 11/14/2015   Procedure: ENDOBRONCHIAL ULTRASOUND;  Surgeon: [name], MD;  Location: [facility] ORS;  Service: Cardiopulmonary;  Laterality: N/A;  . PERIPHERAL VASCULAR CATHETERIZATION N/A 12/04/2015   Procedure: Glori Luis Cath Insertion;  Surgeon: Algernon Huxley, MD;  Location: [facility] CV LAB;  Service: Cardiovascular;  Laterality: N/A;  . TONSILLECTOMY AND ADENOIDECTOMY    . VESICOVAGINAL FISTULA CLOSURE W/ TAH      Family History  Problem Relation Age of Onset  . Stroke Mother     Massive  . Heart attack Father     Massive  . Heart attack Brother   . Heart attack Brother   . Lung cancer Maternal Grandfather   . Heart attack Paternal Grandmother     MI   Social History:  reports that she quit smoking about 9 months ago. Her smoking use included Cigarettes. She has a 50.00 pack-year smoking history. She has never used smokeless tobacco. She reports that she does not drink alcohol or use drugs.  Allergies:  Allergies  Allergen Reactions  . Coconut Fatty Acids Swelling    Throat swells    Medications Prior to Admission  Medication Sig Dispense Refill  .  albuterol (PROVENTIL) (5 MG/ML) 0.5% nebulizer solution Take 2.5 mg by nebulization 2 (two) times daily.    Marland Kitchen atorvastatin (LIPITOR) 20 MG tablet Take 1 tablet by mouth at  bedtime 90 tablet 3  . budesonide-formoterol (SYMBICORT) 160-4.5 MCG/ACT inhaler Inhale 2 puffs into the lungs 2 (two) times daily. 1 Inhaler 0  . diazepam (VALIUM) 5 MG tablet Take 1 tablet (5 mg total) by mouth every 6 (six) hours as needed for muscle spasms. 30 tablet 5  . gabapentin (NEURONTIN) 600 MG tablet TAKE 1 TABLET BY MOUTH TWO   TIMES DAILY 180 tablet 3  . glimepiride (AMARYL) 2 MG tablet Take 1 tablet (2 mg total) by mouth 2 (two) times daily. 60 tablet 2  . isosorbide mononitrate (IMDUR) 30 MG 24 hr tablet Take 1 tablet by mouth  daily 90 tablet 3  . lidocaine-prilocaine (EMLA) cream Apply to port 1-2 hours prior to chemotherapy appointment. Cover with plastic wrap. 90 g 3  . magnesium oxide (MAG-OX) 400 (241.3 MG) MG tablet Take 1 tablet by mouth 2 (two) times daily.    . metFORMIN (GLUCOPHAGE) 1000 MG tablet Take 1 tablet by mouth  every day 90 tablet 3  . metoprolol tartrate (LOPRESSOR) 25 MG tablet Take 1 tablet (25 mg total) by mouth 2 (two) times daily. 60 tablet 2  . Multiple Vitamins-Minerals (ICAPS) CAPS Take 2 capsules by mouth daily. Reported on 04/17/2015    . nitroGLYCERIN (NITROSTAT) 0.4 MG SL tablet Place 1 tablet (0.4 mg total) under the tongue every 5 (five) minutes as needed for chest pain. 25 tablet 3  . ondansetron (ZOFRAN) 8 MG tablet Take 1 tablet (8 mg total) by mouth every 8 (eight) hours as needed for nausea or vomiting. 30 tablet 1  . pantoprazole (PROTONIX) 40 MG tablet Take 1 tablet by mouth  daily 90 tablet 3  . pioglitazone (ACTOS) 30 MG tablet Take 1 tablet (30 mg total) by mouth daily. 90 tablet 1  . potassium chloride (K-DUR) 10 MEQ tablet Take 3 tablets by mouth  daily as directed (Patient taking differently: Take 1 tablet by mouth 3 times daily) 270 tablet 1  . prochlorperazine (COMPAZINE) 10 MG tablet Take 1 tablet (10 mg total) by mouth every 6 (six) hours as needed (Nausea or vomiting). 30 tablet 1  . azithromycin (ZITHROMAX Z-PAK) 250 MG tablet Take two pills the first day then one pill daily (Patient not taking: Reported on 07/14/2016) 6 each 0  . glimepiride (AMARYL) 4 MG tablet TAKE 1 TABLET BY MOUTH TWO  TIMES DAILY 180 tablet 3    Results for orders placed or performed during the hospital encounter of 07/14/16 (from the past 48 hour(s))  Basic metabolic panel     Status:  Abnormal   Collection Time: 07/14/16  4:17 PM  Result Value Ref Range   Sodium 137 135 - 145 mmol/L   Potassium 4.5 3.5 - 5.1 mmol/L   Chloride 102 101 - 111 mmol/L   CO2 26 22 - 32 mmol/L   Glucose, Bld 220 (H) 65 - 99 mg/dL   BUN 48 (H) 6 - 20 mg/dL   Creatinine, Ser 2.75 (H) 0.44 - 1.00 mg/dL   Calcium 9.4 8.9 - 10.3 mg/dL   GFR calc non Af Amer 16 (L) >60 mL/min   GFR calc Af Amer 18 (L) >60 mL/min    Comment: (NOTE) The eGFR has been calculated using the CKD EPI equation. This calculation has not been validated in all clinical  situations. eGFR's persistently <60 mL/min signify possible Chronic Kidney Disease.    Anion gap 9 5 - 15  CBC with Differential     Status: Abnormal   Collection Time: 07/14/16  4:17 PM  Result Value Ref Range   WBC 5.9 3.6 - 11.0 K/uL   RBC 2.99 (L) 3.80 - 5.20 MIL/uL   Hemoglobin 9.9 (L) 12.0 - 16.0 g/dL   HCT 28.9 (L) 35.0 - 47.0 %   MCV 96.4 80.0 - 100.0 fL   MCH 32.9 26.0 - 34.0 pg   MCHC 34.1 32.0 - 36.0 g/dL   RDW 15.4 (H) 11.5 - 14.5 %   Platelets 234 150 - 440 K/uL   Neutrophils Relative % 76 %   Neutro Abs 4.4 1.4 - 6.5 K/uL   Lymphocytes Relative 13 %   Lymphs Abs 0.8 (L) 1.0 - 3.6 K/uL   Monocytes Relative 9 %   Monocytes Absolute 0.5 0.2 - 0.9 K/uL   Eosinophils Relative 2 %   Eosinophils Absolute 0.1 0 - 0.7 K/uL   Basophils Relative 0 %   Basophils Absolute 0.0 0 - 0.1 K/uL  TSH     Status: None   Collection Time: 07/14/16  4:17 PM  Result Value Ref Range   TSH 3.136 0.350 - 4.500 uIU/mL    Comment: Performed by a 3rd Generation assay with a functional sensitivity of <=0.01 uIU/mL.  Urinalysis, Complete w Microscopic     Status: Abnormal   Collection Time: 07/14/16 10:37 PM  Result Value Ref Range   Color, Urine YELLOW (A) YELLOW   APPearance CLEAR (A) CLEAR   Specific Gravity, Urine 1.012 1.005 - 1.030   pH 5.0 5.0 - 8.0   Glucose, UA NEGATIVE NEGATIVE mg/dL   Hgb urine dipstick NEGATIVE NEGATIVE   Bilirubin Urine  NEGATIVE NEGATIVE   Ketones, ur NEGATIVE NEGATIVE mg/dL   Protein, ur NEGATIVE NEGATIVE mg/dL   Nitrite NEGATIVE NEGATIVE   Leukocytes, UA NEGATIVE NEGATIVE   RBC / HPF 0-5 0 - 5 RBC/hpf   WBC, UA 0-5 0 - 5 WBC/hpf   Bacteria, UA NONE SEEN NONE SEEN   Squamous Epithelial / LPF 0-5 (A) NONE SEEN   Dg Lumbar Spine Complete  Result Date: 07/14/2016 CLINICAL DATA:  76 year old female with pain radiating from the low back to both legs with no known injury. Pain began during radiation treatment for lung cancer about 1 month ago. Difficulty walking. EXAM: LUMBAR SPINE - COMPLETE 4+ VIEW COMPARISON:  PET-CT 03/17/2016. [facility] Lumbar radiographs 08/27/2015. FINDINGS: Stable cholecystectomy clips.  Aortoiliac calcified atherosclerosis. Normal lumbar segmentation. Lumbar vertebral height and alignment appears stable since 2017. Visible lower thoracic levels appear intact. Chronic degenerative endplate spurring at W1-X9. Sacral ala and SI joints appear intact. No acute osseous abnormality identified. IMPRESSION: 1. No acute osseous abnormality identified in the lumbar spine. If occult compression fracture or metastatic disease is suspected then Lumbar MRI or Nuclear Medicine Whole-body Bone Scan would best evaluate further. 2.  Calcified aortic atherosclerosis. Electronically Signed   By: Genevie Ann M.D.   On: 07/14/2016 15:24   Mr Thoracic Spine Wo Contrast  Result Date: 07/14/2016 CLINICAL DATA:  Increasing weakness for a few days, fell a few weeks ago. Lethargy and slowed speech. Evaluate back pain. History of lung cancer. EXAM: MRI THORACIC AND LUMBAR SPINE WITHOUT CONTRAST TECHNIQUE: Multiplanar and multiecho pulse sequences of the thoracic and lumbar spine were obtained without intravenous contrast. COMPARISON:  CT chest June 16, 2016 and  PET- CT March 17, 2016 FINDINGS: MRI THORACIC SPINE FINDINGS- Mild motion degraded examination. ALIGNMENT: Maintenance of the thoracic kyphosis. No  malalignment. VERTEBRAE/DISCS: Vertebral bodies are intact. Intervertebral discs morphology and signal are normal. No suspicious bone marrow signal nor acute osseous process. Minimal chronic discogenic endplate changes. CORD: Thoracic spinal cord is normal morphology and signal characteristics to the level of the conus medullaris though motion degrades sensitivity for potential subtle cord signal abnormality. PREVERTEBRAL AND PARASPINAL SOFT TISSUES:  Nonacute. DISC LEVELS: No significant disc bulge, canal stenosis or neural foraminal narrowing at any thoracic level. MRI LUMBAR SPINE FINDINGS SEGMENTATION: For the purposes of this report, the last well-formed intervertebral disc will be described as L5-S1. ALIGNMENT: Maintenance of the lumbar lordosis. No malalignment. VERTEBRAE:Vertebral bodies are intact. Intervertebral discs demonstrate normal morphology with mild disc desiccation all lumbar levels. Multilevel mild chronic discogenic endplate changes. No suspicious bone marrow signal nor acute osseous process. CONUS MEDULLARIS: Conus medullaris terminates at L1-2 and demonstrates normal morphology and signal characteristics. Cauda equina is normal. PARASPINAL AND SOFT TISSUES: Included prevertebral and paraspinal soft tissues are nonacute. 2.6 cm LEFT adrenal mass previously characterized as adenoma. DISC LEVELS: L1-2: 4 x 4 mm central disc extrusion with 7 mm of contiguous superior migration. 3 mm broad-based disc bulge. Mild facet arthropathy without canal stenosis or neural foraminal narrowing. L2-3: 4 mm broad-based disc bulge, central annular fissure. Mild facet arthropathy without canal stenosis. Minimal neural foraminal narrowing. L3-4: 4 mm broad-based disc bulge. Moderate RIGHT mild LEFT facet arthropathy. Mild canal stenosis. Mild bilateral neural foraminal narrowing. L4-5: 2 mm broad-based disc bulge, superimposed small RIGHT subarticular to extraforaminal disc protrusion. Moderate facet arthropathy  and ligamentum flavum redundancy. No canal stenosis though partial effacement lateral recess may affect the traversing RIGHT L5 nerve. Minimal neural foraminal narrowing. L5-S1: No disc bulge. Moderate facet arthropathy without canal stenosis or neural foraminal narrowing. IMPRESSION: MR THORACIC SPINE IMPRESSION Early degenerative change of thoracic spine without acute osseous process. No canal stenosis or neural foraminal narrowing. MR LUMBAR SPINE IMPRESSION Small L1-2 disc extrusion. No acute osseous process. Mild canal stenosis L3-4. Minimal to mild L2-3 through L4-5 neural foraminal narrowing. Encroachment upon the traversing RIGHT L5 nerve. Electronically Signed   By: Elon Alas M.D.   On: 07/14/2016 20:39   Mr Lumbar Spine Wo Contrast  Result Date: 07/14/2016 CLINICAL DATA:  Increasing weakness for a few days, fell a few weeks ago. Lethargy and slowed speech. Evaluate back pain. History of lung cancer. EXAM: MRI THORACIC AND LUMBAR SPINE WITHOUT CONTRAST TECHNIQUE: Multiplanar and multiecho pulse sequences of the thoracic and lumbar spine were obtained without intravenous contrast. COMPARISON:  CT chest June 16, 2016 and PET- CT March 17, 2016 FINDINGS: MRI THORACIC SPINE FINDINGS- Mild motion degraded examination. ALIGNMENT: Maintenance of the thoracic kyphosis. No malalignment. VERTEBRAE/DISCS: Vertebral bodies are intact. Intervertebral discs morphology and signal are normal. No suspicious bone marrow signal nor acute osseous process. Minimal chronic discogenic endplate changes. CORD: Thoracic spinal cord is normal morphology and signal characteristics to the level of the conus medullaris though motion degrades sensitivity for potential subtle cord signal abnormality. PREVERTEBRAL AND PARASPINAL SOFT TISSUES:  Nonacute. DISC LEVELS: No significant disc bulge, canal stenosis or neural foraminal narrowing at any thoracic level. MRI LUMBAR SPINE FINDINGS SEGMENTATION: For the purposes of this  report, the last well-formed intervertebral disc will be described as L5-S1. ALIGNMENT: Maintenance of the lumbar lordosis. No malalignment. VERTEBRAE:Vertebral bodies are intact. Intervertebral discs demonstrate normal morphology with mild  disc desiccation all lumbar levels. Multilevel mild chronic discogenic endplate changes. No suspicious bone marrow signal nor acute osseous process. CONUS MEDULLARIS: Conus medullaris terminates at L1-2 and demonstrates normal morphology and signal characteristics. Cauda equina is normal. PARASPINAL AND SOFT TISSUES: Included prevertebral and paraspinal soft tissues are nonacute. 2.6 cm LEFT adrenal mass previously characterized as adenoma. DISC LEVELS: L1-2: 4 x 4 mm central disc extrusion with 7 mm of contiguous superior migration. 3 mm broad-based disc bulge. Mild facet arthropathy without canal stenosis or neural foraminal narrowing. L2-3: 4 mm broad-based disc bulge, central annular fissure. Mild facet arthropathy without canal stenosis. Minimal neural foraminal narrowing. L3-4: 4 mm broad-based disc bulge. Moderate RIGHT mild LEFT facet arthropathy. Mild canal stenosis. Mild bilateral neural foraminal narrowing. L4-5: 2 mm broad-based disc bulge, superimposed small RIGHT subarticular to extraforaminal disc protrusion. Moderate facet arthropathy and ligamentum flavum redundancy. No canal stenosis though partial effacement lateral recess may affect the traversing RIGHT L5 nerve. Minimal neural foraminal narrowing. L5-S1: No disc bulge. Moderate facet arthropathy without canal stenosis or neural foraminal narrowing. IMPRESSION: MR THORACIC SPINE IMPRESSION Early degenerative change of thoracic spine without acute osseous process. No canal stenosis or neural foraminal narrowing. MR LUMBAR SPINE IMPRESSION Small L1-2 disc extrusion. No acute osseous process. Mild canal stenosis L3-4. Minimal to mild L2-3 through L4-5 neural foraminal narrowing. Encroachment upon the traversing  RIGHT L5 nerve. Electronically Signed   By: Elon Alas M.D.   On: 07/14/2016 20:39   US Renal  Result Date: 07/14/2016 CLINICAL DATA:  Acute onset of lower back pain and lethargy. Initial encounter. EXAM: RENAL / URINARY TRACT ULTRASOUND COMPLETE COMPARISON:  Renal ultrasound performed 02/05/2016, and PET/CT performed 03/17/2016 FINDINGS: Right Kidney: Length: 9.2 cm. Echogenicity within normal limits. No mass or hydronephrosis visualized. Left Kidney: Length: 9.3 cm. Echogenicity within normal limits. No mass or hydronephrosis visualized. Bladder: The bladder is moderately distended and grossly unremarkable. IMPRESSION: 1. No evidence of hydronephrosis. Kidneys unremarkable in appearance. 2. Bladder moderately distended; the patient does not feel the need to void, raising question for urinary retention. Electronically Signed   By: Garald Balding M.D.   On: 07/14/2016 22:30    Review of Systems  Constitutional: Negative for chills and fever.  HENT: Negative for sore throat and tinnitus.   Eyes: Negative for blurred vision and redness.  Respiratory: Negative for cough and shortness of breath.   Cardiovascular: Negative for chest pain, palpitations, orthopnea and PND.  Gastrointestinal: Positive for nausea. Negative for abdominal pain, diarrhea and vomiting.  Genitourinary: Negative for dysuria, frequency and urgency.  Musculoskeletal: Positive for back pain. Negative for joint pain and myalgias.  Skin: Negative for rash.       No lesions  Neurological: Positive for weakness. Negative for speech change and focal weakness.  Endo/Heme/Allergies: Does not bruise/bleed easily.       No temperature intolerance  Psychiatric/Behavioral: Negative for depression and suicidal ideas.    Blood pressure (!) 125/55, pulse 79, temperature 97.9 F (36.6 C), temperature source Oral, resp. rate 20, height _0  (1.626 m), weight 72.3 kg (159 lb 6.4 oz), SpO2 95 %. Physical Exam  Vitals  reviewed. Constitutional: She is oriented to person, place, and time. She appears well-developed and well-nourished. No distress.  HENT:  Head: Normocephalic and atraumatic.  Mouth/Throat: Oropharynx is clear and moist.  Eyes: Conjunctivae and EOM are normal. Pupils are equal, round, and reactive to light.  Neck: Normal range of motion. Neck supple. No JVD present. No tracheal  deviation present. No thyromegaly present.  Cardiovascular: Normal rate, regular rhythm and normal heart sounds.  Exam reveals no gallop and no friction rub.   No murmur heard. Respiratory: Effort normal and breath sounds normal.  Port in place  GI: Soft. Bowel sounds are normal. She exhibits no distension. There is no tenderness.  Genitourinary:  Genitourinary Comments: Deferred  Musculoskeletal: Normal range of motion. She exhibits no edema.  Lymphadenopathy:    She has no cervical adenopathy.  Neurological: She is alert and oriented to person, place, and time. No cranial nerve deficit. She exhibits normal muscle tone.  + straight leg test  Skin: Skin is warm and dry. No rash noted. No erythema.  Psychiatric: She has a normal mood and affect. Her behavior is normal. Judgment and thought content normal.     Assessment/Plan This is a 76 year old female admitted for acute on chronic kidney injury. 1. AKI: Acute on chronic; likely secondary to dehydration. Hydrate with intravenous fluid. Avoid nephrotoxic agents.  2. Back pain: Acute on chronic. No red flag symptoms. Manage pain. Continue gabapentin 3. Diabetes mellitus type 2: Sliding scale insulin while hospitalized. Hold oral hypoglycemic agents 4. Hypertension: Controlled; continue metoprolol 5. COPD: Continue inhaled corticosteroid. Appear all as needed 6. Coronary artery disease: Stable; continue Imdur 7. Hyperlipidemia: Continue statin therapy 8. DVT prophylaxis: Heparin 9. GI prophylaxis: PPI per home regimen The patient is a full code. Time spent on  admission orders and patient care proximally 45 minutes  Harrie Foreman, MD 07/15/2016, 7:08 AM

## 2016-07-15 NOTE — ED Notes (Signed)
Patient observed resting peacefully.

## 2016-07-15 NOTE — Progress Notes (Signed)
[facility] NAME: [name] [name]    MR#:  [id number]  DATE OF BIRTH:  02/04/1941  SUBJECTIVE:  CHIEF COMPLAINT:   Chief Complaint  Patient presents with  . Back Pain   -Patient with history of lung cancer status post radiation and chemotherapy, currently in remission presents secondary to worsening low back pain and difficulty walking. -Sensation is intact but flexion at the hips causes worsening low back pain.  REVIEW OF SYSTEMS:  Review of Systems  Constitutional: Positive for malaise/fatigue. Negative for chills and fever.  HENT: Negative for congestion, ear discharge, hearing loss and nosebleeds.   Eyes: Negative for blurred vision and double vision.  Respiratory: Negative for cough, shortness of breath and wheezing.   Cardiovascular: Negative for chest pain, palpitations and leg swelling.  Gastrointestinal: Negative for abdominal pain, constipation, diarrhea, nausea and vomiting.  Genitourinary: Negative for dysuria.  Musculoskeletal: Positive for back pain and myalgias.  Neurological: Negative for dizziness, sensory change, speech change, focal weakness, seizures and headaches.  Psychiatric/Behavioral: Negative for depression.    DRUG ALLERGIES:   Allergies  Allergen Reactions  . Coconut Fatty Acids Swelling    Throat swells    VITALS:  Blood pressure (!) 117/42, pulse 77, temperature 97.9 F (36.6 C), temperature source Oral, resp. rate 20, height '5\' 4"'$  (1.626 m), weight 72.3 kg (159 lb 6.4 oz), SpO2 95 %.  PHYSICAL EXAMINATION:  Physical Exam  GENERAL:  76 y.o.-year-old patient lying in the bed with no acute distress.  EYES: Pupils equal, round, reactive to light and accommodation. No scleral icterus. Extraocular muscles intact.  HEENT: Head atraumatic, normocephalic. Oropharynx and nasopharynx clear.  NECK:  Supple, no jugular venous distention. No thyroid enlargement, no tenderness.  LUNGS: Normal breath  sounds bilaterally, no wheezing, rales,rhonchi or crepitation. No use of accessory muscles of respiration.  CARDIOVASCULAR: S1, S2 normal. No murmurs, rubs, or gallops.  ABDOMEN: Soft, nontender, nondistended. Bowel sounds present. No organomegaly or mass.  EXTREMITIES: No pedal edema, cyanosis, or clubbing.  Straight leg raising test causing worsening low back pain especially in the right leg was extended NEUROLOGIC: Cranial nerves II through XII are intact. Muscle strength 5/5 in all extremities. Sensation intact. Gait not checked.  PSYCHIATRIC: The patient is alert and oriented x 3.  SKIN: No obvious rash, lesion, or ulcer.    LABORATORY PANEL:   CBC  Recent Labs Lab 07/14/16 1617  WBC 5.9  HGB 9.9*  HCT 28.9*  PLT 234   ------------------------------------------------------------------------------------------------------------------  Chemistries   Recent Labs Lab 07/14/16 1617  NA 137  K 4.5  CL 102  CO2 26  GLUCOSE 220*  BUN 48*  CREATININE 2.75*  CALCIUM 9.4   ------------------------------------------------------------------------------------------------------------------  Cardiac Enzymes No results for input(s): TROPONINI in the last 168 hours. ------------------------------------------------------------------------------------------------------------------  RADIOLOGY:  Dg Lumbar Spine Complete  Result Date: 07/14/2016 CLINICAL DATA:  76 year old female with pain radiating from the low back to both legs with no known injury. Pain began during radiation treatment for lung cancer about 1 month ago. Difficulty walking. EXAM: LUMBAR SPINE - COMPLETE 4+ VIEW COMPARISON:  PET-CT 03/17/2016. [facility] Lumbar radiographs 08/27/2015. FINDINGS: Stable cholecystectomy clips.  Aortoiliac calcified atherosclerosis. Normal lumbar segmentation. Lumbar vertebral height and alignment appears stable since 2017. Visible lower thoracic levels appear intact. Chronic  degenerative endplate spurring at J6-R6. Sacral ala and SI joints appear intact. No acute osseous abnormality identified. IMPRESSION: 1. No acute osseous abnormality identified in the lumbar  spine. If occult compression fracture or metastatic disease is suspected then Lumbar MRI or Nuclear Medicine Whole-body Bone Scan would best evaluate further. 2.  Calcified aortic atherosclerosis. Electronically Signed   By: Genevie Ann M.D.   On: 07/14/2016 15:24   Mr Thoracic Spine Wo Contrast  Result Date: 07/14/2016 CLINICAL DATA:  Increasing weakness for a few days, fell a few weeks ago. Lethargy and slowed speech. Evaluate back pain. History of lung cancer. EXAM: MRI THORACIC AND LUMBAR SPINE WITHOUT CONTRAST TECHNIQUE: Multiplanar and multiecho pulse sequences of the thoracic and lumbar spine were obtained without intravenous contrast. COMPARISON:  CT chest June 16, 2016 and PET- CT March 17, 2016 FINDINGS: MRI THORACIC SPINE FINDINGS- Mild motion degraded examination. ALIGNMENT: Maintenance of the thoracic kyphosis. No malalignment. VERTEBRAE/DISCS: Vertebral bodies are intact. Intervertebral discs morphology and signal are normal. No suspicious bone marrow signal nor acute osseous process. Minimal chronic discogenic endplate changes. CORD: Thoracic spinal cord is normal morphology and signal characteristics to the level of the conus medullaris though motion degrades sensitivity for potential subtle cord signal abnormality. PREVERTEBRAL AND PARASPINAL SOFT TISSUES:  Nonacute. DISC LEVELS: No significant disc bulge, canal stenosis or neural foraminal narrowing at any thoracic level. MRI LUMBAR SPINE FINDINGS SEGMENTATION: For the purposes of this report, the last well-formed intervertebral disc will be described as L5-S1. ALIGNMENT: Maintenance of the lumbar lordosis. No malalignment. VERTEBRAE:Vertebral bodies are intact. Intervertebral discs demonstrate normal morphology with mild disc desiccation all lumbar levels.  Multilevel mild chronic discogenic endplate changes. No suspicious bone marrow signal nor acute osseous process. CONUS MEDULLARIS: Conus medullaris terminates at L1-2 and demonstrates normal morphology and signal characteristics. Cauda equina is normal. PARASPINAL AND SOFT TISSUES: Included prevertebral and paraspinal soft tissues are nonacute. 2.6 cm LEFT adrenal mass previously characterized as adenoma. DISC LEVELS: L1-2: 4 x 4 mm central disc extrusion with 7 mm of contiguous superior migration. 3 mm broad-based disc bulge. Mild facet arthropathy without canal stenosis or neural foraminal narrowing. L2-3: 4 mm broad-based disc bulge, central annular fissure. Mild facet arthropathy without canal stenosis. Minimal neural foraminal narrowing. L3-4: 4 mm broad-based disc bulge. Moderate RIGHT mild LEFT facet arthropathy. Mild canal stenosis. Mild bilateral neural foraminal narrowing. L4-5: 2 mm broad-based disc bulge, superimposed small RIGHT subarticular to extraforaminal disc protrusion. Moderate facet arthropathy and ligamentum flavum redundancy. No canal stenosis though partial effacement lateral recess may affect the traversing RIGHT L5 nerve. Minimal neural foraminal narrowing. L5-S1: No disc bulge. Moderate facet arthropathy without canal stenosis or neural foraminal narrowing. IMPRESSION: MR THORACIC SPINE IMPRESSION Early degenerative change of thoracic spine without acute osseous process. No canal stenosis or neural foraminal narrowing. MR LUMBAR SPINE IMPRESSION Small L1-2 disc extrusion. No acute osseous process. Mild canal stenosis L3-4. Minimal to mild L2-3 through L4-5 neural foraminal narrowing. Encroachment upon the traversing RIGHT L5 nerve. Electronically Signed   By: Elon Alas M.D.   On: 07/14/2016 20:39   Mr Lumbar Spine Wo Contrast  Result Date: 07/14/2016 CLINICAL DATA:  Increasing weakness for a few days, fell a few weeks ago. Lethargy and slowed speech. Evaluate back pain. History  of lung cancer. EXAM: MRI THORACIC AND LUMBAR SPINE WITHOUT CONTRAST TECHNIQUE: Multiplanar and multiecho pulse sequences of the thoracic and lumbar spine were obtained without intravenous contrast. COMPARISON:  CT chest June 16, 2016 and PET- CT March 17, 2016 FINDINGS: MRI THORACIC SPINE FINDINGS- Mild motion degraded examination. ALIGNMENT: Maintenance of the thoracic kyphosis. No malalignment. VERTEBRAE/DISCS: Vertebral bodies are intact. Intervertebral  discs morphology and signal are normal. No suspicious bone marrow signal nor acute osseous process. Minimal chronic discogenic endplate changes. CORD: Thoracic spinal cord is normal morphology and signal characteristics to the level of the conus medullaris though motion degrades sensitivity for potential subtle cord signal abnormality. PREVERTEBRAL AND PARASPINAL SOFT TISSUES:  Nonacute. DISC LEVELS: No significant disc bulge, canal stenosis or neural foraminal narrowing at any thoracic level. MRI LUMBAR SPINE FINDINGS SEGMENTATION: For the purposes of this report, the last well-formed intervertebral disc will be described as L5-S1. ALIGNMENT: Maintenance of the lumbar lordosis. No malalignment. VERTEBRAE:Vertebral bodies are intact. Intervertebral discs demonstrate normal morphology with mild disc desiccation all lumbar levels. Multilevel mild chronic discogenic endplate changes. No suspicious bone marrow signal nor acute osseous process. CONUS MEDULLARIS: Conus medullaris terminates at L1-2 and demonstrates normal morphology and signal characteristics. Cauda equina is normal. PARASPINAL AND SOFT TISSUES: Included prevertebral and paraspinal soft tissues are nonacute. 2.6 cm LEFT adrenal mass previously characterized as adenoma. DISC LEVELS: L1-2: 4 x 4 mm central disc extrusion with 7 mm of contiguous superior migration. 3 mm broad-based disc bulge. Mild facet arthropathy without canal stenosis or neural foraminal narrowing. L2-3: 4 mm broad-based disc  bulge, central annular fissure. Mild facet arthropathy without canal stenosis. Minimal neural foraminal narrowing. L3-4: 4 mm broad-based disc bulge. Moderate RIGHT mild LEFT facet arthropathy. Mild canal stenosis. Mild bilateral neural foraminal narrowing. L4-5: 2 mm broad-based disc bulge, superimposed small RIGHT subarticular to extraforaminal disc protrusion. Moderate facet arthropathy and ligamentum flavum redundancy. No canal stenosis though partial effacement lateral recess may affect the traversing RIGHT L5 nerve. Minimal neural foraminal narrowing. L5-S1: No disc bulge. Moderate facet arthropathy without canal stenosis or neural foraminal narrowing. IMPRESSION: MR THORACIC SPINE IMPRESSION Early degenerative change of thoracic spine without acute osseous process. No canal stenosis or neural foraminal narrowing. MR LUMBAR SPINE IMPRESSION Small L1-2 disc extrusion. No acute osseous process. Mild canal stenosis L3-4. Minimal to mild L2-3 through L4-5 neural foraminal narrowing. Encroachment upon the traversing RIGHT L5 nerve. Electronically Signed   By: Elon Alas M.D.   On: 07/14/2016 20:39   US Renal  Result Date: 07/14/2016 CLINICAL DATA:  Acute onset of lower back pain and lethargy. Initial encounter. EXAM: RENAL / URINARY TRACT ULTRASOUND COMPLETE COMPARISON:  Renal ultrasound performed 02/05/2016, and PET/CT performed 03/17/2016 FINDINGS: Right Kidney: Length: 9.2 cm. Echogenicity within normal limits. No mass or hydronephrosis visualized. Left Kidney: Length: 9.3 cm. Echogenicity within normal limits. No mass or hydronephrosis visualized. Bladder: The bladder is moderately distended and grossly unremarkable. IMPRESSION: 1. No evidence of hydronephrosis. Kidneys unremarkable in appearance. 2. Bladder moderately distended; the patient does not feel the need to void, raising question for urinary retention. Electronically Signed   By: Garald Balding M.D.   On: 07/14/2016 22:30    EKG:    Orders placed or performed during the hospital encounter of 07/14/16  . ED EKG  . ED EKG    ASSESSMENT AND PLAN:   76 year old female with past medical history significant for stage IIIa small cell lung cancer status post chemotherapy and radiation, coronary artery disease, hypertension and diabetes presents to hospital secondary to worsening of low back pain and also ataxia.  #1 acute on chronic low back pain-MRI of thoracic and lumbar spine showing degenerative disc disease and also L2-L5 spondylarthritis. -No cord compression symptoms noted. Started her on Medrol Dosepak and pain medications. -Physical therapy consult. -Imaging studies did not show any metastatic disease in the spine.  #  2 acute renal failure-patient has CK D stage III with baseline creatinine around 1.7, now worsened up to 2.75. -Likely ATN. Continue gentle hydration for today. -Avoid nephrotoxins and monitor renal function.  #3 hypertension-on Imdur  #4 COPD-stable. Continue inhalers  #5 stage III small cell lung cancer-of right upper lobe received 3 cycles of chemotherapy in October 2017, just finished her radiation. Following with oncologist and currently in remission based on recent CT. Three-month follow-up recommended. -Patient on home oxygen.  #6 DVT prophylaxis-continue subcutaneous heparin  Follow-up physical therapy recommendations     All the records are reviewed and case discussed with Care Management/Social Workerr. Management plans discussed with the patient, family and they are in agreement.  CODE STATUS: Full code  TOTAL TIME TAKING CARE OF THIS PATIENT: 37  minutes.   POSSIBLE D/C IN 1-2 DAYS, DEPENDING ON CLINICAL CONDITION.   Gladstone Lighter M.D on 07/15/2016 at 2:13 PM  Between 7am to 6pm - Pager - [phone]  After 6pm go to [email or web address] - password [email or web address]  Office  [phone]  CC: Primary care physician; [name], MD

## 2016-07-15 NOTE — ED Notes (Signed)
Patient still sleeping soundly. No needs at this time.

## 2016-07-16 ENCOUNTER — Telehealth: Payer: Self-pay | Admitting: Family Medicine

## 2016-07-16 LAB — GLUCOSE, CAPILLARY
Glucose-Capillary: 171 mg/dL — ABNORMAL HIGH (ref 65–99)
Glucose-Capillary: 231 mg/dL — ABNORMAL HIGH (ref 65–99)

## 2016-07-16 LAB — BASIC METABOLIC PANEL
Anion gap: 5 (ref 5–15)
BUN: 31 mg/dL — AB (ref 6–20)
CALCIUM: 8.5 mg/dL — AB (ref 8.9–10.3)
CO2: 26 mmol/L (ref 22–32)
CREATININE: 1.56 mg/dL — AB (ref 0.44–1.00)
Chloride: 109 mmol/L (ref 101–111)
GFR calc non Af Amer: 32 mL/min — ABNORMAL LOW (ref >60)
GFR, EST AFRICAN AMERICAN: 37 mL/min — AB (ref >60)
Glucose, Bld: 216 mg/dL — ABNORMAL HIGH (ref 65–99)
Potassium: 4.8 mmol/L (ref 3.5–5.1)
Sodium: 140 mmol/L (ref 135–145)

## 2016-07-16 MED ORDER — HEPARIN SOD (PORK) LOCK FLUSH 100 UNIT/ML IV SOLN
500.0000 [IU] | Freq: Once | INTRAVENOUS | Status: DC
  Filled 2016-07-16: qty 5

## 2016-07-16 MED ORDER — HYDROCODONE-ACETAMINOPHEN 5-325 MG PO TABS
1.0000 | ORAL_TABLET | Freq: Four times a day (QID) | ORAL | 0 refills | Status: DC | PRN
Start: 2016-07-16 — End: 2018-05-07

## 2016-07-16 MED ORDER — HYDROCORTISONE 0.5 % EX CREA
TOPICAL_CREAM | Freq: Two times a day (BID) | CUTANEOUS | Status: DC
  Filled 2016-07-16: qty 28.35

## 2016-07-16 MED ORDER — HYDROCORTISONE 0.5 % EX CREA
TOPICAL_CREAM | Freq: Two times a day (BID) | CUTANEOUS | Status: DC | PRN
  Filled 2016-07-16: qty 28.35

## 2016-07-16 MED ORDER — GABAPENTIN 600 MG PO TABS: 300.0000 mg | ORAL_TABLET | Freq: Two times a day (BID) | ORAL | 2 refills | Status: DC

## 2016-07-16 MED ORDER — METHYLPREDNISOLONE 4 MG PO TBPK: ORAL_TABLET | ORAL | 0 refills | Status: DC

## 2016-07-16 NOTE — Progress Notes (Signed)
[facility] NAME: [name] [name]    MR#:  [id number]  DATE OF BIRTH:  November 07, 1940  SUBJECTIVE:  CHIEF COMPLAINT:   Chief Complaint  Patient presents with  . Back Pain   -Kidney function has improved and is back to normal. -Back pain is improving. Physical therapy consult is pending  REVIEW OF SYSTEMS:  Review of Systems  Constitutional: Negative for chills, fever and malaise/fatigue.  HENT: Negative for congestion, ear discharge, hearing loss and nosebleeds.   Eyes: Negative for blurred vision and double vision.  Respiratory: Negative for cough, shortness of breath and wheezing.   Cardiovascular: Negative for chest pain, palpitations and leg swelling.  Gastrointestinal: Negative for abdominal pain, constipation, diarrhea, nausea and vomiting.  Genitourinary: Negative for dysuria.  Musculoskeletal: Positive for back pain and myalgias.  Neurological: Negative for dizziness, sensory change, speech change, focal weakness, seizures and headaches.  Psychiatric/Behavioral: Negative for depression.    DRUG ALLERGIES:   Allergies  Allergen Reactions  . Coconut Fatty Acids Swelling    Throat swells    VITALS:  Blood pressure (!) 107/42, pulse 74, temperature 97.8 F (36.6 C), temperature source Oral, resp. rate 20, height '5\' 4"'$  (1.626 m), weight 74.5 kg (164 lb 4.8 oz), SpO2 92 %.  PHYSICAL EXAMINATION:  Physical Exam  GENERAL:  76 y.o.-year-old patient lying in the bed with no acute distress.  EYES: Pupils equal, round, reactive to light and accommodation. No scleral icterus. Extraocular muscles intact.  HEENT: Head atraumatic, normocephalic. Oropharynx and nasopharynx clear.  NECK:  Supple, no jugular venous distention. No thyroid enlargement, no tenderness.  LUNGS: Normal breath sounds bilaterally, no wheezing, rales,rhonchi or crepitation. No use of accessory muscles of respiration.  CARDIOVASCULAR: S1, S2 normal. No  murmurs, rubs, or gallops.  ABDOMEN: Soft, nontender, nondistended. Bowel sounds present. No organomegaly or mass.  EXTREMITIES: No pedal edema, cyanosis, or clubbing.  NEUROLOGIC: Cranial nerves II through XII are intact. Muscle strength 5/5 in all extremities. Sensation intact. Gait not checked.  PSYCHIATRIC: The patient is alert and oriented x 3.  SKIN: No obvious rash, lesion, or ulcer.    LABORATORY PANEL:   CBC  Recent Labs Lab 07/14/16 1617  WBC 5.9  HGB 9.9*  HCT 28.9*  PLT 234   ------------------------------------------------------------------------------------------------------------------  Chemistries   Recent Labs Lab 07/16/16 0514  NA 140  K 4.8  CL 109  CO2 26  GLUCOSE 216*  BUN 31*  CREATININE 1.56*  CALCIUM 8.5*   ------------------------------------------------------------------------------------------------------------------  Cardiac Enzymes No results for input(s): TROPONINI in the last 168 hours. ------------------------------------------------------------------------------------------------------------------  RADIOLOGY:  Dg Lumbar Spine Complete  Result Date: 07/14/2016 CLINICAL DATA:  76 year old female with pain radiating from the low back to both legs with no known injury. Pain began during radiation treatment for lung cancer about 1 month ago. Difficulty walking. EXAM: LUMBAR SPINE - COMPLETE 4+ VIEW COMPARISON:  PET-CT 03/17/2016. [facility] Lumbar radiographs 08/27/2015. FINDINGS: Stable cholecystectomy clips.  Aortoiliac calcified atherosclerosis. Normal lumbar segmentation. Lumbar vertebral height and alignment appears stable since 2017. Visible lower thoracic levels appear intact. Chronic degenerative endplate spurring at G8-Q7. Sacral ala and SI joints appear intact. No acute osseous abnormality identified. IMPRESSION: 1. No acute osseous abnormality identified in the lumbar spine. If occult compression fracture or metastatic  disease is suspected then Lumbar MRI or Nuclear Medicine Whole-body Bone Scan would best evaluate further. 2.  Calcified aortic atherosclerosis. Electronically Signed   By: Herminio Heads.D.  On: 07/14/2016 15:24   Mr Thoracic Spine Wo Contrast  Result Date: 07/14/2016 CLINICAL DATA:  Increasing weakness for a few days, fell a few weeks ago. Lethargy and slowed speech. Evaluate back pain. History of lung cancer. EXAM: MRI THORACIC AND LUMBAR SPINE WITHOUT CONTRAST TECHNIQUE: Multiplanar and multiecho pulse sequences of the thoracic and lumbar spine were obtained without intravenous contrast. COMPARISON:  CT chest June 16, 2016 and PET- CT March 17, 2016 FINDINGS: MRI THORACIC SPINE FINDINGS- Mild motion degraded examination. ALIGNMENT: Maintenance of the thoracic kyphosis. No malalignment. VERTEBRAE/DISCS: Vertebral bodies are intact. Intervertebral discs morphology and signal are normal. No suspicious bone marrow signal nor acute osseous process. Minimal chronic discogenic endplate changes. CORD: Thoracic spinal cord is normal morphology and signal characteristics to the level of the conus medullaris though motion degrades sensitivity for potential subtle cord signal abnormality. PREVERTEBRAL AND PARASPINAL SOFT TISSUES:  Nonacute. DISC LEVELS: No significant disc bulge, canal stenosis or neural foraminal narrowing at any thoracic level. MRI LUMBAR SPINE FINDINGS SEGMENTATION: For the purposes of this report, the last well-formed intervertebral disc will be described as L5-S1. ALIGNMENT: Maintenance of the lumbar lordosis. No malalignment. VERTEBRAE:Vertebral bodies are intact. Intervertebral discs demonstrate normal morphology with mild disc desiccation all lumbar levels. Multilevel mild chronic discogenic endplate changes. No suspicious bone marrow signal nor acute osseous process. CONUS MEDULLARIS: Conus medullaris terminates at L1-2 and demonstrates normal morphology and signal characteristics. Cauda equina  is normal. PARASPINAL AND SOFT TISSUES: Included prevertebral and paraspinal soft tissues are nonacute. 2.6 cm LEFT adrenal mass previously characterized as adenoma. DISC LEVELS: L1-2: 4 x 4 mm central disc extrusion with 7 mm of contiguous superior migration. 3 mm broad-based disc bulge. Mild facet arthropathy without canal stenosis or neural foraminal narrowing. L2-3: 4 mm broad-based disc bulge, central annular fissure. Mild facet arthropathy without canal stenosis. Minimal neural foraminal narrowing. L3-4: 4 mm broad-based disc bulge. Moderate RIGHT mild LEFT facet arthropathy. Mild canal stenosis. Mild bilateral neural foraminal narrowing. L4-5: 2 mm broad-based disc bulge, superimposed small RIGHT subarticular to extraforaminal disc protrusion. Moderate facet arthropathy and ligamentum flavum redundancy. No canal stenosis though partial effacement lateral recess may affect the traversing RIGHT L5 nerve. Minimal neural foraminal narrowing. L5-S1: No disc bulge. Moderate facet arthropathy without canal stenosis or neural foraminal narrowing. IMPRESSION: MR THORACIC SPINE IMPRESSION Early degenerative change of thoracic spine without acute osseous process. No canal stenosis or neural foraminal narrowing. MR LUMBAR SPINE IMPRESSION Small L1-2 disc extrusion. No acute osseous process. Mild canal stenosis L3-4. Minimal to mild L2-3 through L4-5 neural foraminal narrowing. Encroachment upon the traversing RIGHT L5 nerve. Electronically Signed   By: Elon Alas M.D.   On: 07/14/2016 20:39   Mr Lumbar Spine Wo Contrast  Result Date: 07/14/2016 CLINICAL DATA:  Increasing weakness for a few days, fell a few weeks ago. Lethargy and slowed speech. Evaluate back pain. History of lung cancer. EXAM: MRI THORACIC AND LUMBAR SPINE WITHOUT CONTRAST TECHNIQUE: Multiplanar and multiecho pulse sequences of the thoracic and lumbar spine were obtained without intravenous contrast. COMPARISON:  CT chest June 16, 2016 and  PET- CT March 17, 2016 FINDINGS: MRI THORACIC SPINE FINDINGS- Mild motion degraded examination. ALIGNMENT: Maintenance of the thoracic kyphosis. No malalignment. VERTEBRAE/DISCS: Vertebral bodies are intact. Intervertebral discs morphology and signal are normal. No suspicious bone marrow signal nor acute osseous process. Minimal chronic discogenic endplate changes. CORD: Thoracic spinal cord is normal morphology and signal characteristics to the level of the conus medullaris though motion  degrades sensitivity for potential subtle cord signal abnormality. PREVERTEBRAL AND PARASPINAL SOFT TISSUES:  Nonacute. DISC LEVELS: No significant disc bulge, canal stenosis or neural foraminal narrowing at any thoracic level. MRI LUMBAR SPINE FINDINGS SEGMENTATION: For the purposes of this report, the last well-formed intervertebral disc will be described as L5-S1. ALIGNMENT: Maintenance of the lumbar lordosis. No malalignment. VERTEBRAE:Vertebral bodies are intact. Intervertebral discs demonstrate normal morphology with mild disc desiccation all lumbar levels. Multilevel mild chronic discogenic endplate changes. No suspicious bone marrow signal nor acute osseous process. CONUS MEDULLARIS: Conus medullaris terminates at L1-2 and demonstrates normal morphology and signal characteristics. Cauda equina is normal. PARASPINAL AND SOFT TISSUES: Included prevertebral and paraspinal soft tissues are nonacute. 2.6 cm LEFT adrenal mass previously characterized as adenoma. DISC LEVELS: L1-2: 4 x 4 mm central disc extrusion with 7 mm of contiguous superior migration. 3 mm broad-based disc bulge. Mild facet arthropathy without canal stenosis or neural foraminal narrowing. L2-3: 4 mm broad-based disc bulge, central annular fissure. Mild facet arthropathy without canal stenosis. Minimal neural foraminal narrowing. L3-4: 4 mm broad-based disc bulge. Moderate RIGHT mild LEFT facet arthropathy. Mild canal stenosis. Mild bilateral neural  foraminal narrowing. L4-5: 2 mm broad-based disc bulge, superimposed small RIGHT subarticular to extraforaminal disc protrusion. Moderate facet arthropathy and ligamentum flavum redundancy. No canal stenosis though partial effacement lateral recess may affect the traversing RIGHT L5 nerve. Minimal neural foraminal narrowing. L5-S1: No disc bulge. Moderate facet arthropathy without canal stenosis or neural foraminal narrowing. IMPRESSION: MR THORACIC SPINE IMPRESSION Early degenerative change of thoracic spine without acute osseous process. No canal stenosis or neural foraminal narrowing. MR LUMBAR SPINE IMPRESSION Small L1-2 disc extrusion. No acute osseous process. Mild canal stenosis L3-4. Minimal to mild L2-3 through L4-5 neural foraminal narrowing. Encroachment upon the traversing RIGHT L5 nerve. Electronically Signed   By: Elon Alas M.D.   On: 07/14/2016 20:39   US Renal  Result Date: 07/14/2016 CLINICAL DATA:  Acute onset of lower back pain and lethargy. Initial encounter. EXAM: RENAL / URINARY TRACT ULTRASOUND COMPLETE COMPARISON:  Renal ultrasound performed 02/05/2016, and PET/CT performed 03/17/2016 FINDINGS: Right Kidney: Length: 9.2 cm. Echogenicity within normal limits. No mass or hydronephrosis visualized. Left Kidney: Length: 9.3 cm. Echogenicity within normal limits. No mass or hydronephrosis visualized. Bladder: The bladder is moderately distended and grossly unremarkable. IMPRESSION: 1. No evidence of hydronephrosis. Kidneys unremarkable in appearance. 2. Bladder moderately distended; the patient does not feel the need to void, raising question for urinary retention. Electronically Signed   By: Garald Balding M.D.   On: 07/14/2016 22:30    EKG:   Orders placed or performed during the hospital encounter of 07/14/16  . ED EKG  . ED EKG    ASSESSMENT AND PLAN:   76 year old female with past medical history significant for stage IIIa small cell lung cancer status post  chemotherapy and radiation, coronary artery disease, hypertension and diabetes presents to hospital secondary to worsening of low back pain and also ataxia.  #1 acute on chronic low back pain-MRI of thoracic and lumbar spine showing degenerative disc disease and also L2-L5 spondylarthritis. -No cord compression symptoms noted. on Medrol Dosepak and pain medications. -Physical therapy consult. -Imaging studies did not show any metastatic disease in the spine.  #2 acute renal failure-patient has CK D stage III with baseline creatinine around 1.7, now worsened up to 2.75. -With fluids, creatinine is back to baseline. DC IV fluids today -Likely ATN.  -Avoid nephrotoxins and monitor renal function.  #  3 hypertension-on Imdur  #4 COPD-stable. Continue inhalers  #5 stage III small cell lung cancer-of right upper lobe received 3 cycles of chemotherapy in October 2017, just finished her radiation. Following with oncologist and currently in remission based on recent CT. Three-month follow-up recommended. -Patient on home oxygen.  #6 DVT prophylaxis-continue subcutaneous heparin  Follow-up physical therapy recommendations Possible discharge today or tomorrow     All the records are reviewed and case discussed with Care Management/Social Workerr. Management plans discussed with the patient, family and they are in agreement.  CODE STATUS: Full code  TOTAL TIME TAKING CARE OF THIS PATIENT: 36  minutes.   POSSIBLE D/C TODAY OR TOMORROW, DEPENDING ON CLINICAL CONDITION.   Gladstone Lighter M.D on 07/16/2016 at 12:32 PM  Between 7am to 6pm - Pager - [phone]  After 6pm go to [email or web address] - password [facility]  Office  [phone]  CC: Primary care physician; [name], MD

## 2016-07-16 NOTE — Evaluation (Signed)
Physical Therapy Evaluation Patient Details Name: [name] [name] MRN: [id number] DOB: October 25, 1940 Today's Date: 07/16/2016   History of Present Illness  Pt is a 76 y.o. female presenting to hospital with 1 week of increasing weakness and difficulty ambulating associated with B N/T to legs; also increased back pain for several weeks.  Pt admitted with acute on chronic kidney injury and acute on chronic LBP (MRI T/L spine showing DDD and L2-5 spondylarthritis).  PMH includes Lung CA s/p chemo (currently in remission), COPD, CAD, DM, htn, stroke, and h/o ACDF.  Clinical Impression  Prior to hospital admission, pt was ambulating with SPC (or RW as needed).  Pt lives with her husband in 1 level home with stairs to enter.  Currently pt is SBA supine to sit and CGA with transfers and ambulation 115 feet with RW.  Pt overall appearing weak but was steady during functional mobility using RW; no loss of balance noted with standing during toileting tasks (see below for details).  Pt would benefit from skilled PT to address noted impairments and functional limitations.  Recommend pt discharge to home with HHPT and SBA for functional mobility using RW (except anticipate assist with stairs: pt's husband reports he will have extra assist to help pt on stairs upon discharge home and will be able to provide assist for functional mobility in general if needed).    Follow Up Recommendations Home health PT;Supervision for mobility/OOB (assist with stairs)    Equipment Recommendations  Rolling walker with 5" wheels (pt already has RW at home)    Recommendations for Other Services       Precautions / Restrictions Precautions Precautions: Fall Precaution Comments: L chest port Restrictions Weight Bearing Restrictions: No      Mobility  Bed Mobility Overal bed mobility: Needs Assistance Bed Mobility: Supine to Sit     Supine to sit: Supervision     General bed mobility comments: HOB mildly elevated; mild  increased effort to perform; use of side rail; no physical assist provided  Transfers Overall transfer level: Needs assistance Equipment used: Rolling walker (2 wheeled) Transfers: Sit to/from Stand Sit to Stand: Min guard         General transfer comment: Sit to/from stand from bed and toilet; steady without loss of balance  Ambulation/Gait Ambulation/Gait assistance: Min guard Ambulation Distance (Feet): 115 Feet (plus initial 25 feet ambulating to bathroom) Assistive device: Rolling walker (2 wheeled)   Gait velocity: decreased   General Gait Details: mild decreased B step length; decreased cadence; steady without loss of balance; occasional vc's to stay closer to RW required  Stairs            Wheelchair Mobility    Modified Rankin (Stroke Patients Only)       Balance Overall balance assessment: Needs assistance Sitting-balance support: No upper extremity supported;Feet supported Sitting balance-Leahy Scale: Good Sitting balance - Comments: Dynamic balance reaching within BOS   Standing balance support: No upper extremity supported Standing balance-Leahy Scale: Good Standing balance comment: standing to doff/donn underwear for toileting and to perform hygiene steady without loss of balance                             Pertinent Vitals/Pain Pain Assessment: Faces Faces Pain Scale: Hurts a little bit Pain Location: back pain Pain Descriptors / Indicators: Sore Pain Intervention(s): Limited activity within patient's tolerance;Monitored during session;Repositioned  Vitals (HR and O2 on room air) stable and [facility]  throughout treatment session.    Home Living Family/patient expects to be discharged to:: Private residence Living Arrangements: Spouse/significant other Available Help at Discharge: Family Type of Home: Mobile home Home Access: Stairs to enter Entrance Stairs-Rails: Right;Left;Can reach both Entrance Stairs-Number of Steps: [place]: One level Home Equipment: [place] - 2 wheels;Cane - single point;Wheelchair - manual;Shower seat      Prior Function Level of Independence: Independent with assistive device(s)         Comments: Pt was ambulating with SPC most of the time but uses RW as needed.  Independent with ADL's and IADL's recently.  Uses 3 L O2 via nasal cannula at night.     Hand Dominance        Extremity/Trunk Assessment   Upper Extremity Assessment Upper Extremity Assessment: Generalized weakness    Lower Extremity Assessment Lower Extremity Assessment: Generalized weakness       Communication   Communication: No difficulties  Cognition Arousal/Alertness: Awake/alert Behavior During Therapy: WFL for tasks assessed/performed Overall Cognitive Status: Within Functional Limits for tasks assessed                                        General Comments General comments (skin integrity, edema, etc.): Pt's husband present during session.  Pt agreeable to PT session.    Exercises  Transfers and ambulation activities.   Assessment/Plan    PT Assessment Patient needs continued PT services  PT Problem List Decreased strength;Decreased activity tolerance;Decreased mobility       PT Treatment Interventions DME instruction;Gait training;Stair training;Functional mobility training;Therapeutic activities;Therapeutic exercise;Balance training;Patient/family education    PT Goals (Current goals can be found in the Care Plan section)  Acute Rehab PT Goals Patient Stated Goal: to go home PT Goal Formulation: With patient Time For Goal Achievement: 07/30/16 Potential to Achieve Goals: Good    Frequency Min 2X/week   Barriers to discharge        Co-evaluation               End of Session Equipment Utilized During Treatment: Gait belt Activity Tolerance: Patient tolerated treatment well Patient left: in chair;with call bell/phone within reach;with chair alarm  set;with family/visitor present Nurse Communication: Mobility status;Precautions PT Visit Diagnosis: Muscle weakness (generalized) (M62.81);Difficulty in walking, not elsewhere classified (R26.2)    Time: 3744-5146 PT Time Calculation (min) (ACUTE ONLY): 38 min   Charges:   PT Evaluation $PT Eval Low Complexity: 1 Procedure PT Treatments $Therapeutic Activity: 23-37 mins   PT G CodesLeitha Bleak, PT 07/16/16, 4:05 PM [phone]

## 2016-07-16 NOTE — Telephone Encounter (Signed)
Transition of care

## 2016-07-16 NOTE — Care Management (Signed)
Admitted to this facility with the diagnosis of acute kidney injury. Lives with husband, [name] [phone]). See Dr. Rosanna Randy a couple of months ago. [facility] November 2017. No skilled facility. No home oxygen. Transport wheelchair 2 rolling walkers, and 2 canes. Prescriptions are filled at [facility] on [address]. Takes care of all basic activities of daily living herself, drives if needed. Last fall was a couple of weeks ago. Good appetite. Family will transport. Physical therapy evaluation completed. Recommending home with home health and physical therapy. Discussed agencies. Would like to try [facility]. Will update Rema Jasmine, [facility] representative. Discharge to home today per Dr. Darletta Moll RN MSN CCM Care Management [phone]

## 2016-07-16 NOTE — Telephone Encounter (Signed)
[facility] called and scheduled hospital f/u with Dr. Rosanna Randy on 07/30/16 @ 3:30 pm. Pt is being discharged today 07/16/16 and was treated for acute kidney injury. Thanks TNP

## 2016-07-16 NOTE — Progress Notes (Signed)
Inpatient Diabetes Program Recommendations  AACE/ADA: New Consensus Statement on Inpatient Glycemic Control (2015)  Target Ranges:  Prepandial:   less than 140 mg/dL      Peak postprandial:   less than 180 mg/dL (1-2 hours)      Critically ill patients:  140 - 180 mg/dL   Lab Results  Component Value Date   GLUCAP 171 (H) 07/16/2016   HGBA1C 8.0 04/30/2016    Review of Glycemic Control  Results for [name] [name], [name] [name] (MRN [id number]) as of 07/16/2016 11:35  Ref. Range 07/15/2016 07:35 07/15/2016 11:40 07/15/2016 16:45 07/15/2016 20:45 07/16/2016 07:25  Glucose-Capillary Latest Ref Range: 65 - 99 mg/dL 99 139 (H) 179 (H) 169 (H) 171 (H)    Diabetes history:DM2 Outpatient Diabetes medications: Actos 30 mg daily, Amaryl 2 mg BID, Metformin 1000 mg daily  Current orders for Inpatient glycemic control: Novolog 0-15 units TID with meals, Novolog 0-5 units QHS * steroid taper   Inpatient Diabetes Program Recommendations: Consider starting the patient on basal insulin, Lantus 8 units qhs (0.1 unit/kg)  Gentry Fitz, RN, BA, [place], CDE Diabetes Coordinator Inpatient Diabetes Program  [phone] (Team Pager) [phone] ([place]) 07/16/2016 11:39 AM

## 2016-07-17 ENCOUNTER — Other Ambulatory Visit: Payer: Self-pay | Admitting: Family Medicine

## 2016-07-17 DIAGNOSIS — C3411 Malignant neoplasm of upper lobe, right bronchus or lung: Secondary | ICD-10-CM

## 2016-07-17 MED ORDER — METOPROLOL TARTRATE 25 MG PO TABS: 25.0000 mg | ORAL_TABLET | Freq: Two times a day (BID) | ORAL | 2 refills | Status: DC

## 2016-07-17 MED ORDER — ONDANSETRON HCL 8 MG PO TABS: 8.0000 mg | ORAL_TABLET | Freq: Three times a day (TID) | ORAL | 1 refills | Status: DC | PRN

## 2016-07-17 NOTE — Care Management (Signed)
Received telephone call from [facility] representative. States that they are unable to accept Ms. Clifton for Home Health and physical therapy. Information faxed to [facility]. Will update Ms. Colona RN MSN CCM Care Management [phone]

## 2016-07-17 NOTE — Telephone Encounter (Signed)
Transition Care Management Follow-up Telephone Call    Date discharged?   How have you been since you were released from the hospital? Patient state she feels slightly better, though she is still unable to walk, still having back pain and weakness, but not as bad. Pt denies urinary symptoms.  Any patient concerns? Pt would like to get a back brace as soon as she can.   Items Reviewed:  Medications reviewed: Yes  Allergies reviewed: Yes  Dietary changes reviewed: Heart healthy diet  Referrals reviewed: N/A   Functional Questionnaire:  Independent - I Dependent - D    Activities of Daily Living (ADLs):    Personal hygiene - I Dressing - I Eating - I Maintaining continence - I Transferring - I   Independent Activities of Daily Living (iADLs): Basic communication skills - I Transportation - D Meal preparation - I Shopping - I Housework - I Managing medications - I  Managing personal finances - I   Confirmed importance and date/time of follow-up visits scheduled YES  Provider Appointment booked with PCP 07/30/16 @ 3:30 PM.  Confirmed with patient if condition begins to worsen call PCP or go to the ER.  Patient was given the office number and encouraged to call back with question or concerns: YES

## 2016-07-17 NOTE — Discharge Summary (Signed)
[facility] NAME: [name] [name]    MR#:  [id number]  DATE OF BIRTH:  [birth year]  DATE OF ADMISSION:  07/14/2016   ADMITTING PHYSICIAN: Harrie Foreman, MD  DATE OF DISCHARGE: 07/16/2016  3:36 PM  PRIMARY CARE PHYSICIAN: [name], MD   ADMISSION DIAGNOSIS:   Hydronephrosis [N13.30] Back pain [M54.9] Weakness [R53.1] Acute kidney injury ([place]) [N17.9]  DISCHARGE DIAGNOSIS:   Active Problems:   AKI (acute kidney injury) ([place])   SECONDARY DIAGNOSIS:   Past Medical History:  Diagnosis Date  . Abnormal CT lung screening 10/17/2015  . COPD (chronic obstructive pulmonary disease) ([place])   . Coronary artery disease, non-occlusive    a. cath 2006: min nonobs CAD; b. cath 12/2010: cath LAD 50%, RCA 60%; c. 08/2013: Minimal luminal irregs, right dominant system with no significant CAD, diffuse luminal irregs noted. Normal EF 55%, no AS or MS.   . Diabetes mellitus   . Hyperlipemia    Followed by Dr. Rosanna Randy  . Hypertension   . Lung cancer ([place])   . Macular degeneration    rt  . Personal history of tobacco use, presenting hazards to health 10/15/2015  . Pneumonia    hx  . Shortness of breath   . Stroke [facility])     HOSPITAL COURSE:   76 year old female with past medical history significant for stage IIIa small cell lung cancer status post chemotherapy and radiation, coronary artery disease, hypertension and diabetes presents to hospital secondary to worsening of low back pain and also ataxia.  #1 acute on chronic low back pain-MRI of thoracic and lumbar spine showing degenerative disc disease and also L2-L5 spondylarthritis. -No cord compression symptoms noted. on Medrol Dosepak and pain medications.Much improvement in symptoms noted. -Physical therapy consulted and patient did pretty decent with walking. Will be discharged with home health services -Imaging studies did not show any metastatic disease in the  spine.  #2 acute renal failure-patient has CK D stage III with baseline creatinine around 1.7, now worsened up to 2.75. -Improved With fluids, creatinine is back to baseline. -Likely ATN.  -Avoid nephrotoxins and monitor renal function. -Outpatient nephrology follow-up recommended  #3 hypertension-on Imdur  #4 COPD-stable. Continue inhalers  #5 stage III small cell lung cancer-of right upper lobe received 3 cycles of chemotherapy in October 2017, just finished her radiation. Following with oncologist and currently in remission based on recent CT. Three-month follow-up recommended. -Patient on home oxygen.   discharge today   DISCHARGE CONDITIONS:   Stable  CONSULTS OBTAINED:   None  DRUG ALLERGIES:   Allergies  Allergen Reactions  . Coconut Fatty Acids Swelling    Throat swells   DISCHARGE MEDICATIONS:   Allergies as of 07/16/2016      Reactions   Coconut Fatty Acids Swelling   Throat swells      Medication List    STOP taking these medications   azithromycin 250 MG tablet Commonly known as:  ZITHROMAX Z-PAK   lidocaine-prilocaine cream Commonly known as:  EMLA   metFORMIN 1000 MG tablet Commonly known as:  GLUCOPHAGE   pioglitazone 30 MG tablet Commonly known as:  ACTOS   prochlorperazine 10 MG tablet Commonly known as:  COMPAZINE     TAKE these medications   albuterol (5 MG/ML) 0.5% nebulizer solution Commonly known as:  PROVENTIL Take 2.5 mg by nebulization 2 (two) times daily.   atorvastatin 20 MG tablet Commonly known as:  LIPITOR Take  1 tablet by mouth at  bedtime   budesonide-formoterol 160-4.5 MCG/ACT inhaler Commonly known as:  SYMBICORT Inhale 2 puffs into the lungs 2 (two) times daily.   diazepam 5 MG tablet Commonly known as:  VALIUM Take 1 tablet (5 mg total) by mouth every 6 (six) hours as needed for muscle spasms.   gabapentin 600 MG tablet Commonly known as:  NEURONTIN Take 0.5 tablets (300 mg total) by mouth 2 (two)  times daily. What changed:  See the new instructions.   glimepiride 2 MG tablet Commonly known as:  AMARYL Take 1 tablet (2 mg total) by mouth 2 (two) times daily. What changed:  Another medication with the same name was removed. Continue taking this medication, and follow the directions you see here.   HYDROcodone-acetaminophen 5-325 MG tablet Commonly known as:  NORCO Take 1 tablet by mouth every 6 (six) hours as needed for moderate pain or severe pain.   ICAPS Caps Take 2 capsules by mouth daily. Reported on 04/17/2015   isosorbide mononitrate 30 MG 24 hr tablet Commonly known as:  IMDUR Take 1 tablet by mouth  daily   magnesium oxide 400 (241.3 Mg) MG tablet Commonly known as:  MAG-OX Take 1 tablet by mouth 2 (two) times daily.   methylPREDNISolone 4 MG Tbpk tablet Commonly known as:  MEDROL DOSEPAK Use as directed   nitroGLYCERIN 0.4 MG SL tablet Commonly known as:  NITROSTAT Place 1 tablet (0.4 mg total) under the tongue every 5 (five) minutes as needed for chest pain.   pantoprazole 40 MG tablet Commonly known as:  PROTONIX Take 1 tablet by mouth  daily   potassium chloride 10 MEQ tablet Commonly known as:  K-DUR Take 3 tablets by mouth  daily as directed What changed:  See the new instructions.        DISCHARGE INSTRUCTIONS:   1. PCP follow-up in 1-2 weeks  DIET:   Cardiac diet  ACTIVITY:   Activity as tolerated  OXYGEN:   Home Oxygen: No  Oxygen Delivery: Room air  DISCHARGE LOCATION:   home   If you experience worsening of your admission symptoms, develop shortness of breath, life threatening emergency, suicidal or homicidal thoughts you must seek medical attention immediately by calling 911 or calling your MD immediately  if symptoms less severe.  You Must read complete instructions/literature along with all the possible adverse reactions/side effects for all the Medicines you take and that have been prescribed to you. Take any new  Medicines after you have completely understood and accpet all the possible adverse reactions/side effects.   Please note  You were cared for by a hospitalist during your hospital stay. If you have any questions about your discharge medications or the care you received while you were in the hospital after you are discharged, you can call the unit and asked to speak with the hospitalist on call if the hospitalist that took care of you is not available. Once you are discharged, your primary care physician will handle any further medical issues. Please note that NO REFILLS for any discharge medications will be authorized once you are discharged, as it is imperative that you return to your primary care physician (or establish a relationship with a primary care physician if you do not have one) for your aftercare needs so that they can reassess your need for medications and monitor your lab values.    On the day of Discharge:  VITAL SIGNS:   Blood pressure (!) 117/50, pulse  72, temperature 97.8 F (36.6 C), temperature source Oral, resp. rate 16, height '5\' 4"'$  (1.626 m), weight 74.5 kg (164 lb 4.8 oz), SpO2 96 %.  PHYSICAL EXAMINATION:    GENERAL:  76 y.o.-year-old patient lying in the bed with no acute distress.  EYES: Pupils equal, round, reactive to light and accommodation. No scleral icterus. Extraocular muscles intact.  HEENT: Head atraumatic, normocephalic. Oropharynx and nasopharynx clear.  NECK:  Supple, no jugular venous distention. No thyroid enlargement, no tenderness.  LUNGS: Normal breath sounds bilaterally, no wheezing, rales,rhonchi or crepitation. No use of accessory muscles of respiration.  CARDIOVASCULAR: S1, S2 normal. No murmurs, rubs, or gallops.  ABDOMEN: Soft, nontender, nondistended. Bowel sounds present. No organomegaly or mass.  EXTREMITIES: No pedal edema, cyanosis, or clubbing.  NEUROLOGIC: Cranial nerves II through XII are intact. Muscle strength 5/5 in all extremities.  Sensation intact. Gait not checked.  PSYCHIATRIC: The patient is alert and oriented x 3.  SKIN: No obvious rash, lesion, or ulcer.  DATA REVIEW:   CBC  Recent Labs Lab 07/14/16 1617  WBC 5.9  HGB 9.9*  HCT 28.9*  PLT 234    Chemistries   Recent Labs Lab 07/16/16 0514  NA 140  K 4.8  CL 109  CO2 26  GLUCOSE 216*  BUN 31*  CREATININE 1.56*  CALCIUM 8.5*     Microbiology Results  Results for orders placed or performed during the hospital encounter of 02/05/16  CULTURE, BLOOD (ROUTINE X 2) w Reflex to ID Panel     Status: None   Collection Time: 02/10/16  1:56 PM  Result Value Ref Range Status   Specimen Description BLOOD R ARM  Final   Special Requests   Final    BOTTLES DRAWN AEROBIC AND ANAEROBIC AER 3ML ANA 1ML   Culture NO GROWTH 5 DAYS  Final   Report Status 02/15/2016 FINAL  Final  CULTURE, BLOOD (ROUTINE X 2) w Reflex to ID Panel     Status: None   Collection Time: 02/10/16  2:11 PM  Result Value Ref Range Status   Specimen Description BLOOD L HAND  Final   Special Requests   Final    BOTTLES DRAWN AEROBIC AND ANAEROBIC AER 3ML ANA 2ML   Culture NO GROWTH 5 DAYS  Final   Report Status 02/15/2016 FINAL  Final    RADIOLOGY:  No results found.   Management plans discussed with the patient, family and they are in agreement.  CODE STATUS:  Code Status History    Date Active Date Inactive Code Status Order ID Comments User Context   07/15/2016  4:03 AM 07/16/2016  6:58 PM Full Code [id number]  Harrie Foreman, MD Inpatient   02/05/2016  3:10 PM 02/13/2016  6:11 PM Full Code [id number]  Henreitta Leber, MD Inpatient   01/27/2015  9:48 PM 01/29/2015  5:59 PM Full Code [id number]  Lytle Butte, MD ED   10/24/2014 12:03 PM 10/25/2014  4:26 PM Full Code [id number]  Algernon Huxley, MD Inpatient   08/30/2014  1:05 AM 08/30/2014  7:48 PM Full Code [id number]  Lance Coon, MD Inpatient   10/19/2013  4:19 PM 10/20/2013  4:04 PM Full Code [id number]  Newman Pies, MD  Inpatient      TOTAL TIME TAKING CARE OF THIS PATIENT: 37 minutes.    Gladstone Lighter M.D on 07/17/2016 at 2:47 PM  Between 7am to 6pm - Pager - [phone]  After 6pm go to [email or web address] - [place]  [facility]  Office  [phone]  CC: Primary care physician; [name], MD   Note: This dictation was prepared with Dragon dictation along with smaller phrase technology. Any transcriptional errors that result from this process are unintentional.

## 2016-07-17 NOTE — Telephone Encounter (Signed)
Pt called saying she just got out of the hospital and noticed that she is missing 2 medications.  She needs    metoprolol tartrate (LOPRESSOR) 25 MG tablet  ondansetron (ZOFRAN) 8 MG tablet  Please send to  [facility] on [address]

## 2016-07-17 NOTE — Telephone Encounter (Signed)
On pt's fu visit Dr. Romero Belling requested she get a back brace for her back pain.  Per pt's daughter.  Thanks Con Memos

## 2016-07-18 ENCOUNTER — Other Ambulatory Visit: Payer: Self-pay | Admitting: Family Medicine

## 2016-07-20 ENCOUNTER — Telehealth: Payer: Self-pay | Admitting: Family Medicine

## 2016-07-20 NOTE — Telephone Encounter (Signed)
Patient was advised that the medications are sometimes adjusted when you go to the hospital and that she is to take the ones that were listed at the time of discharge until we see her in the office.  She also asked about a back brace that the 'kidney' doctor recommended.  I tole her that we would need to discuss it during her OV but if it were something for her kidneys that doctor would most likely need to be the one to write the order.

## 2016-07-20 NOTE — Telephone Encounter (Signed)
Pt was discharged last week.  Pt would like for MD to review her meds and make sure they are okay, states some meds were taken away and others were added.  She is not scheduled to see the MD for a hospital F/U until 4/26

## 2016-07-22 ENCOUNTER — Encounter: Payer: Self-pay | Admitting: *Deleted

## 2016-07-22 ENCOUNTER — Other Ambulatory Visit: Payer: Self-pay | Admitting: *Deleted

## 2016-07-22 NOTE — Patient Outreach (Signed)
[facility]) Care Management  07/22/2016  [name] [name] March 27, 1941 [id number]  EMMI-General discharge referral for red dashboard(unfilled prescriptions):  Noted in chart that primary care office is completing transition of care. Admission 4/10-4/03/2017.  Telephone call to patient who advised that she discussed medication question with primary care  office & was advised to take medications as instructed by discharge instruction from hospital MD. States she was advised that they would review her medications at hospital follow up appointment & let her know if any changes were to be made. Patient advised to take her medications & discharge instructions with her to MD appointment.  Voices understanding. States she is managing her medications & is taking them as prescribed per discharge instructions.   States spouse provides transportation to MD appointments. Has no problems getting prescriptions filled. Able to contact MD as needed. Knows when to call 911. States she has not fallen since discharged to home. Currently using walker to get around.  States she has no further concerns at this time.   EMMI dashboard addressed.   Plan: Send falls prevention literature. Close case/send to care management assistant.   [name] Daisy, RN BSN [facility] Management Coordinator [facility] Care Management  [phone]

## 2016-07-24 DIAGNOSIS — J449 Chronic obstructive pulmonary disease, unspecified: Secondary | ICD-10-CM | POA: Diagnosis not present

## 2016-07-24 DIAGNOSIS — R0602 Shortness of breath: Secondary | ICD-10-CM | POA: Diagnosis not present

## 2016-07-26 DIAGNOSIS — J449 Chronic obstructive pulmonary disease, unspecified: Secondary | ICD-10-CM | POA: Diagnosis not present

## 2016-07-30 ENCOUNTER — Encounter: Payer: Self-pay | Admitting: Family Medicine

## 2016-07-30 ENCOUNTER — Ambulatory Visit (INDEPENDENT_AMBULATORY_CARE_PROVIDER_SITE_OTHER): Payer: Medicare Other | Admitting: Family Medicine

## 2016-07-30 VITALS — BP 102/52 | HR 58 | Temp 97.9°F | Resp 16 | Wt 159.0 lb

## 2016-07-30 DIAGNOSIS — N179 Acute kidney failure, unspecified: Secondary | ICD-10-CM | POA: Diagnosis not present

## 2016-07-30 DIAGNOSIS — I10 Essential (primary) hypertension: Secondary | ICD-10-CM | POA: Diagnosis not present

## 2016-07-30 DIAGNOSIS — E1342 Other specified diabetes mellitus with diabetic polyneuropathy: Secondary | ICD-10-CM | POA: Diagnosis not present

## 2016-07-30 DIAGNOSIS — M47819 Spondylosis without myelopathy or radiculopathy, site unspecified: Secondary | ICD-10-CM

## 2016-07-30 DIAGNOSIS — J449 Chronic obstructive pulmonary disease, unspecified: Secondary | ICD-10-CM

## 2016-07-30 DIAGNOSIS — M4690 Unspecified inflammatory spondylopathy, site unspecified: Secondary | ICD-10-CM

## 2016-07-30 MED ORDER — METOPROLOL TARTRATE 25 MG PO TABS: 25.0000 mg | ORAL_TABLET | Freq: Two times a day (BID) | ORAL | 3 refills | Status: DC

## 2016-07-30 MED ORDER — ALBUTEROL SULFATE (5 MG/ML) 0.5% IN NEBU: 2.5000 mg | INHALATION_SOLUTION | Freq: Two times a day (BID) | RESPIRATORY_TRACT | 11 refills | Status: DC

## 2016-07-30 NOTE — Progress Notes (Signed)
Patient: [name] [name] Female    DOB: 04/24/1940   76 y.o.   MRN: [id number] Visit Date: 07/30/2016  Today's Provider: [name], MD   Chief Complaint  Patient presents with  . Hospitalization Follow-up   Subjective:    HPI   Follow up Hospitalization  Patient was admitted to [facility] on 07/14/2016 and discharged on 07/16/2016. She was treated for acute kidney injury, back pain (MRI back showed L2-L5 spondylarthritis). Treatment for this included prednisone taper, Norco, gabapentin. Pt was referred to nephrology for AKI. Pt has this scheduled for May 16 with Dr. Candiss Norse. Telephone follow up was done on 07/16/2016. She reports good compliance with treatment. She reports this condition is Unchanged. Pt is requesting a prescription for a back brace. Pt's sugars were elevated while in the hospital. Pt was on sliding scale insulin while hospitalized. Pt was told to D/C Metformin and Actos. Pt continues to take her Glimepiride 2 mg BID. Pt's sugars are running in the 200's, which is higher than it has been previously. ------------------------------------------------------------------------------------      Allergies  Allergen Reactions  . Coconut Fatty Acids Swelling    Throat swells     Current Outpatient Prescriptions:  .  albuterol (PROVENTIL) (5 MG/ML) 0.5% nebulizer solution, Take 0.5 mLs (2.5 mg total) by nebulization 2 (two) times daily., Disp: 20 mL, Rfl: 11 .  atorvastatin (LIPITOR) 20 MG tablet, Take 1 tablet by mouth at  bedtime, Disp: 90 tablet, Rfl: 3 .  budesonide-formoterol (SYMBICORT) 160-4.5 MCG/ACT inhaler, Inhale 2 puffs into the lungs 2 (two) times daily., Disp: 1 Inhaler, Rfl: 0 .  diazepam (VALIUM) 5 MG tablet, Take 1 tablet (5 mg total) by mouth every 6 (six) hours as needed for muscle spasms., Disp: 30 tablet, Rfl: 5 .  gabapentin (NEURONTIN) 600 MG tablet, Take 0.5 tablets (300 mg total) by mouth 2 (two) times daily., Disp: 60 tablet, Rfl: 2 .   glimepiride (AMARYL) 2 MG tablet, Take 1 tablet (2 mg total) by mouth 2 (two) times daily., Disp: 60 tablet, Rfl: 2 .  HYDROcodone-acetaminophen (NORCO) 5-325 MG tablet, Take 1 tablet by mouth every 6 (six) hours as needed for moderate pain or severe pain., Disp: 20 tablet, Rfl: 0 .  isosorbide mononitrate (IMDUR) 30 MG 24 hr tablet, Take 1 tablet by mouth  daily, Disp: 90 tablet, Rfl: 3 .  magnesium oxide (MAG-OX) 400 (241.3 MG) MG tablet, Take 1 tablet by mouth 2 (two) times daily., Disp: , Rfl:  .  methylPREDNISolone (MEDROL DOSEPAK) 4 MG TBPK tablet, Use as directed, Disp: 21 tablet, Rfl: 0 .  metoprolol tartrate (LOPRESSOR) 25 MG tablet, Take 1 tablet (25 mg total) by mouth 2 (two) times daily., Disp: 180 tablet, Rfl: 3 .  Multiple Vitamins-Minerals (ICAPS) CAPS, Take 2 capsules by mouth daily. Reported on 04/17/2015, Disp: , Rfl:  .  nitroGLYCERIN (NITROSTAT) 0.4 MG SL tablet, Place 1 tablet (0.4 mg total) under the tongue every 5 (five) minutes as needed for chest pain., Disp: 25 tablet, Rfl: 3 .  ondansetron (ZOFRAN) 8 MG tablet, Take 1 tablet (8 mg total) by mouth every 8 (eight) hours as needed for nausea or vomiting., Disp: 30 tablet, Rfl: 1 .  ONE TOUCH ULTRA TEST test strip, USE TO CHECK BLOOD SUGAR ONCE DAILY, Disp: 100 each, Rfl: 11 .  pantoprazole (PROTONIX) 40 MG tablet, Take 1 tablet by mouth  daily, Disp: 90 tablet, Rfl: 3 .  potassium chloride (K-DUR) 10  MEQ tablet, Take 3 tablets by mouth  daily as directed (Patient taking differently: Take 1 tablet by mouth 3 times daily), Disp: 270 tablet, Rfl: 1 No current facility-administered medications for this visit.   Facility-Administered Medications Ordered in Other Visits:  .  ondansetron (ZOFRAN) 8 mg, dexamethasone (DECADRON) 10 mg in sodium chloride 0.9 % 50 mL IVPB, , Intravenous, Once, Lloyd Huger, MD  Review of Systems  Constitutional: Positive for activity change, appetite change (less), chills and fatigue. Negative  for diaphoresis, fever and unexpected weight change.  HENT: Negative.   Eyes: Negative.   Cardiovascular: Positive for chest pain ("every once in a while." Is on Nitroglycerin for this). Negative for palpitations and leg swelling.  Endocrine: Negative.   Musculoskeletal: Positive for back pain.  Allergic/Immunologic: Negative.   Psychiatric/Behavioral: Negative.     Social History  Substance Use Topics  . Smoking status: Former Smoker    Packs/day: 1.00    Years: 50.00    Types: Cigarettes    Quit date: 10/03/2015  . Smokeless tobacco: Never Used     Comment: smokes 3 cigs daily 05/06/15. Pt instructed to quit.  . Alcohol use No   Objective:   BP (!) 102/52   Pulse (!) 58   Temp 97.9 F (36.6 C) (Oral)   Resp 16   Wt 159 lb (72.1 kg)   SpO2 98%   BMI 27.29 kg/m  Vitals:   07/30/16 1540  BP: (!) 102/52  Pulse: (!) 58  Resp: 16  Temp: 97.9 F (36.6 C)  TempSrc: Oral  SpO2: 98%  Weight: 159 lb (72.1 kg)     Physical Exam  Constitutional: She is oriented to person, place, and time. She appears well-developed and well-nourished.  HENT:  Head: Normocephalic and atraumatic.  Eyes: Conjunctivae are normal. No scleral icterus.  Neck: Normal range of motion.  Cardiovascular: Normal rate, regular rhythm and normal heart sounds.   Pulmonary/Chest: Effort normal and breath sounds normal. No respiratory distress.  Neurological: She is alert and oriented to person, place, and time.  Skin: Skin is warm and dry.  Psychiatric: She has a normal mood and affect. Her behavior is normal. Judgment and thought content normal.        Assessment & Plan:     1. HYPERTENSION, BENIGN Stable. Refills provided. - metoprolol tartrate (LOPRESSOR) 25 MG tablet; Take 1 tablet (25 mg total) by mouth 2 (two) times daily.  Dispense: 180 tablet; Refill: 3  2. CAFL (chronic airflow limitation) ([facility]) Stable. Refills provided. - albuterol (PROVENTIL) (5 MG/ML) 0.5% nebulizer solution; Take 0.5  mLs (2.5 mg total) by nebulization 2 (two) times daily.  Dispense: 20 mL; Refill: 11  3. Spondylarthritis ([place]) Will refer to PT. Back brace prescription written. - Ambulatory referral to Physical Therapy  4. Diabetic polyneuropathy associated with other specified diabetes mellitus ([place]) Continue the med changes that the hospitalist made (D/C Metformin and Actos and continuing glimepiride). Will recheck in 4 weeks for A1C.  5. AKI (acute kidney injury) ([place]) Will recheck labs for stability. - Renal function panel     Patient seen and examined by Miguel Aschoff, MD, and note scribed by Renaldo Fiddler, CMA. I have done the exam and reviewed the above chart and it is accurate to the best of my knowledge. Development worker, community has been used in this note in any air is in the dictation or transcription are unintentional.  [name], MD  [place]

## 2016-07-31 LAB — RENAL FUNCTION PANEL
Albumin: 4.1 g/dL (ref 3.5–4.8)
BUN / CREAT RATIO: 12 (ref 12–28)
BUN: 22 mg/dL (ref 8–27)
CO2: 26 mmol/L (ref 18–29)
Calcium: 9.6 mg/dL (ref 8.7–10.3)
Chloride: 98 mmol/L (ref 96–106)
Creatinine, Ser: 1.8 mg/dL — ABNORMAL HIGH (ref 0.57–1.00)
GFR calc Af Amer: 32 mL/min/{1.73_m2} — ABNORMAL LOW (ref >59)
GFR, EST NON AFRICAN AMERICAN: 27 mL/min/{1.73_m2} — AB (ref >59)
GLUCOSE: 131 mg/dL — AB (ref 65–99)
POTASSIUM: 5.3 mmol/L — AB (ref 3.5–5.2)
Phosphorus: 4 mg/dL (ref 2.5–4.5)
SODIUM: 140 mmol/L (ref 134–144)

## 2016-08-03 ENCOUNTER — Telehealth: Payer: Self-pay | Admitting: Family Medicine

## 2016-08-03 NOTE — Telephone Encounter (Signed)
Pt needs another copy of the order Fr. Rosanna Randy wrote for her a back brace.  She has misplaced it.  Please call when ready  [phone]  [facility]

## 2016-08-03 NOTE — Telephone Encounter (Signed)
Done-aa 

## 2016-08-05 ENCOUNTER — Inpatient Hospital Stay: Payer: Medicare Other | Attending: Oncology

## 2016-08-05 DIAGNOSIS — Z85118 Personal history of other malignant neoplasm of bronchus and lung: Secondary | ICD-10-CM | POA: Diagnosis not present

## 2016-08-05 DIAGNOSIS — Z452 Encounter for adjustment and management of vascular access device: Secondary | ICD-10-CM | POA: Diagnosis not present

## 2016-08-05 DIAGNOSIS — Z95828 Presence of other vascular implants and grafts: Secondary | ICD-10-CM

## 2016-08-05 DIAGNOSIS — R6889 Other general symptoms and signs: Secondary | ICD-10-CM | POA: Diagnosis not present

## 2016-08-05 MED ORDER — SODIUM CHLORIDE 0.9% FLUSH
10.0000 mL | INTRAVENOUS | Status: DC | PRN
  Administered 2016-08-05: 10 mL via INTRAVENOUS
  Filled 2016-08-05: qty 10

## 2016-08-05 MED ORDER — HEPARIN SOD (PORK) LOCK FLUSH 100 UNIT/ML IV SOLN
500.0000 [IU] | Freq: Once | INTRAVENOUS | Status: AC
  Administered 2016-08-05: 500 [IU] via INTRAVENOUS

## 2016-08-10 ENCOUNTER — Telehealth: Payer: Self-pay | Admitting: Family Medicine

## 2016-08-10 DIAGNOSIS — M47819 Spondylosis without myelopathy or radiculopathy, site unspecified: Secondary | ICD-10-CM

## 2016-08-10 NOTE — Telephone Encounter (Signed)
Is this ok to do?-aa

## 2016-08-10 NOTE — Telephone Encounter (Signed)
Pt's husband [name] called requesting that pt have physical therapy in the home instead of going to [facility].Call back # [phone]

## 2016-08-11 NOTE — Telephone Encounter (Signed)
Yes need home health referral

## 2016-08-11 NOTE — Telephone Encounter (Signed)
Order put in-aa

## 2016-08-11 NOTE — Telephone Encounter (Signed)
Spoke with Dr Rosanna Randy to verify. Ok to try for home PT. Do I need to put another order for this?-aa

## 2016-08-11 NOTE — Telephone Encounter (Signed)
Might not be covered if the are mobile/able to leave home .

## 2016-08-12 ENCOUNTER — Ambulatory Visit
Admission: RE | Admit: 2016-08-12 | Discharge: 2016-08-12 | Disposition: A | Discharge status: Home / Self Care | Payer: Medicare Other | Source: Ambulatory Visit | Attending: Family Medicine | Admitting: Family Medicine

## 2016-08-12 DIAGNOSIS — Z1231 Encounter for screening mammogram for malignant neoplasm of breast: Secondary | ICD-10-CM | POA: Diagnosis not present

## 2016-08-14 DIAGNOSIS — M479 Spondylosis, unspecified: Secondary | ICD-10-CM | POA: Diagnosis not present

## 2016-08-18 DIAGNOSIS — M479 Spondylosis, unspecified: Secondary | ICD-10-CM | POA: Diagnosis not present

## 2016-08-19 ENCOUNTER — Other Ambulatory Visit: Payer: Self-pay | Admitting: Cardiovascular Disease

## 2016-08-19 DIAGNOSIS — N183 Chronic kidney disease, stage 3 (moderate): Secondary | ICD-10-CM | POA: Diagnosis not present

## 2016-08-19 DIAGNOSIS — N179 Acute kidney failure, unspecified: Secondary | ICD-10-CM | POA: Diagnosis not present

## 2016-08-19 DIAGNOSIS — I6523 Occlusion and stenosis of bilateral carotid arteries: Secondary | ICD-10-CM

## 2016-08-20 ENCOUNTER — Ambulatory Visit: Payer: Self-pay | Admitting: Physical Therapy

## 2016-08-20 DIAGNOSIS — M479 Spondylosis, unspecified: Secondary | ICD-10-CM | POA: Diagnosis not present

## 2016-08-23 DIAGNOSIS — J449 Chronic obstructive pulmonary disease, unspecified: Secondary | ICD-10-CM | POA: Diagnosis not present

## 2016-08-23 DIAGNOSIS — R0602 Shortness of breath: Secondary | ICD-10-CM | POA: Diagnosis not present

## 2016-08-24 DIAGNOSIS — M479 Spondylosis, unspecified: Secondary | ICD-10-CM | POA: Diagnosis not present

## 2016-08-25 ENCOUNTER — Ambulatory Visit: Payer: Medicare Other

## 2016-08-25 DIAGNOSIS — J449 Chronic obstructive pulmonary disease, unspecified: Secondary | ICD-10-CM | POA: Diagnosis not present

## 2016-08-26 ENCOUNTER — Ambulatory Visit: Payer: Medicare Other | Admitting: Family Medicine

## 2016-08-26 ENCOUNTER — Encounter: Payer: Self-pay | Admitting: Family Medicine

## 2016-08-26 ENCOUNTER — Ambulatory Visit (INDEPENDENT_AMBULATORY_CARE_PROVIDER_SITE_OTHER): Payer: Medicare Other | Admitting: Family Medicine

## 2016-08-26 ENCOUNTER — Ambulatory Visit: Payer: Medicare Other

## 2016-08-26 VITALS — BP 132/64 | HR 62 | Temp 97.7°F | Resp 16 | Wt 157.0 lb

## 2016-08-26 DIAGNOSIS — M47819 Spondylosis without myelopathy or radiculopathy, site unspecified: Secondary | ICD-10-CM

## 2016-08-26 DIAGNOSIS — M4690 Unspecified inflammatory spondylopathy, site unspecified: Secondary | ICD-10-CM

## 2016-08-26 DIAGNOSIS — E1342 Other specified diabetes mellitus with diabetic polyneuropathy: Secondary | ICD-10-CM | POA: Diagnosis not present

## 2016-08-26 DIAGNOSIS — E1121 Type 2 diabetes mellitus with diabetic nephropathy: Secondary | ICD-10-CM | POA: Diagnosis not present

## 2016-08-26 DIAGNOSIS — I6523 Occlusion and stenosis of bilateral carotid arteries: Secondary | ICD-10-CM

## 2016-08-26 DIAGNOSIS — Z87891 Personal history of nicotine dependence: Secondary | ICD-10-CM

## 2016-08-26 LAB — POCT GLYCOSYLATED HEMOGLOBIN (HGB A1C): Hemoglobin A1C: 7.4

## 2016-08-26 NOTE — Progress Notes (Signed)
Subjective:  HPI  Diabetes Mellitus Type II, Follow-up:   Lab Results  Component Value Date   HGBA1C 8.0 04/30/2016   HGBA1C 9.8 01/09/2016   HGBA1C 10.4 05/06/2015    Last seen for diabetes 4 months ago.  Management since then includes none. She reports good compliance with treatment. She is not having side effects.  Home blood sugar records: 130-200  Episodes of hypoglycemia? no   Current Insulin Regimen: n/a Current exercise: none other the PT  Pertinent Labs:    Component Value Date/Time   CHOL 186 08/19/2015 1434   TRIG 226 (H) 08/19/2015 1434   HDL 55 08/19/2015 1434   LDLCALC 86 08/19/2015 1434   CREATININE 1.80 (H) 07/30/2016 1643    Wt Readings from Last 3 Encounters:  08/26/16 157 lb (71.2 kg)  07/30/16 159 lb (72.1 kg)  07/16/16 164 lb 4.8 oz (74.5 kg)    ------------------------------------------------------------------------ Pt reports that her BP is running low at home usually around 99/50. She gets lightheaded sometimes but usually during PT when she is standing up and down a lot.   Prior to Admission medications   Medication Sig Start Date End Date Taking? Authorizing Provider  albuterol (PROVENTIL) (5 MG/ML) 0.5% nebulizer solution Take 0.5 mLs (2.5 mg total) by nebulization 2 (two) times daily. 07/30/16   Jerrol Banana., MD  atorvastatin (LIPITOR) 20 MG tablet Take 1 tablet by mouth at  bedtime 09/04/15   Jerrol Banana., MD  budesonide-formoterol [facility]) 160-4.5 MCG/ACT inhaler Inhale 2 puffs into the lungs 2 (two) times daily. 12/06/14   [name], MD  diazepam (VALIUM) 5 MG tablet Take 1 tablet (5 mg total) by mouth every 6 (six) hours as needed for muscle spasms. 07/07/16   Carmon Ginsberg, PA  gabapentin (NEURONTIN) 600 MG tablet Take 0.5 tablets (300 mg total) by mouth 2 (two) times daily. 07/16/16   Gladstone Lighter, MD  glimepiride (AMARYL) 2 MG tablet Take 1 tablet (2 mg total) by mouth 2 (two) times daily. 06/01/16    Jerrol Banana., MD  HYDROcodone-acetaminophen [facility]) 5-325 MG tablet Take 1 tablet by mouth every 6 (six) hours as needed for moderate pain or severe pain. 07/16/16   Gladstone Lighter, MD  isosorbide mononitrate (IMDUR) 30 MG 24 hr tablet Take 1 tablet by mouth  daily 09/04/15   [name], MD  magnesium oxide (MAG-OX) 400 (241.3 MG) MG tablet Take 1 tablet by mouth 2 (two) times daily. 08/04/13   [provider]  methylPREDNISolone (MEDROL DOSEPAK) 4 MG TBPK tablet Use as directed 07/16/16   Gladstone Lighter, MD  metoprolol tartrate (LOPRESSOR) 25 MG tablet Take 1 tablet (25 mg total) by mouth 2 (two) times daily. 07/30/16   Jerrol Banana., MD  Multiple Vitamins-Minerals (ICAPS) CAPS Take 2 capsules by mouth daily. Reported on 04/17/2015    [provider]  nitroGLYCERIN (NITROSTAT) 0.4 MG SL tablet Place 1 tablet (0.4 mg total) under the tongue every 5 (five) minutes as needed for chest pain. 07/23/14   [name], MD  ondansetron (ZOFRAN) 8 MG tablet Take 1 tablet (8 mg total) by mouth every 8 (eight) hours as needed for nausea or vomiting. 07/17/16   Mar Daring, PA-C  ONE TOUCH ULTRA TEST test strip USE TO CHECK BLOOD SUGAR ONCE DAILY 07/18/16   Jerrol Banana., MD  pantoprazole (PROTONIX) 40 MG tablet Take 1 tablet by mouth  daily 09/04/15   Rosanna Randy,  Retia Passe., MD  potassium chloride (K-DUR) 10 MEQ tablet Take 3 tablets by mouth  daily as directed Patient taking differently: Take 1 tablet by mouth 3 times daily 01/11/15   Jerrol Banana., MD    Patient Active Problem List   Diagnosis Date Noted  . AKI (acute kidney injury) ([place]) 07/15/2016  . Protein-calorie malnutrition, severe 02/08/2016  . Acute renal failure (ARF) ([place]) 02/05/2016  . Primary cancer of right upper lobe of lung ([place]) 11/20/2015  . Adenopathy   . Abnormal CT lung screening 10/17/2015  . Personal history of tobacco use, presenting hazards to health  10/15/2015  . NSTEMI (non-ST elevated myocardial infarction) ([place]) 01/27/2015  . Chronic vulvitis 09/26/2014  . Allergic reaction 09/26/2014  . Carotid stenosis 08/30/2014  . Cervical nerve root disorder 08/10/2014  . Absolute anemia 08/10/2014  . CAD in native artery 08/10/2014  . B12 deficiency 08/10/2014  . Back ache 08/10/2014  . Bronchitis, chronic ([place]) 08/10/2014  . Diabetes mellitus with polyneuropathy ([place]) 08/10/2014  . Can't get food down 08/10/2014  . Eczema of external ear 08/10/2014  . Accumulation of fluid in tissues 08/10/2014  . Gout 08/10/2014  . Adult hypothyroidism 08/10/2014  . Mononeuritis 08/10/2014  . Muscle ache 08/10/2014  . Disorder of peripheral nervous system 08/10/2014  . Lesion of vulva 08/10/2014  . Cervical spondylosis with radiculopathy 10/19/2013  . Unstable angina ([place]) 08/15/2013  . COPD exacerbation ([place]) 03/29/2013  . CAD (coronary artery disease) 06/22/2011  . COPD (chronic obstructive pulmonary disease) with emphysema ([place]) 03/07/2010  . CHEST PAIN UNSPECIFIED 07/22/2009  . HLD (hyperlipidemia) 04/01/2009  . Malaise and fatigue 04/01/2009  . Hyperlipidemia 01/18/2009  . TOBACCO ABUSE 01/18/2009  . HYPERTENSION, BENIGN 01/18/2009  . CLAUDICATION 01/18/2009  . Pain in limb 01/18/2009  . CAFL (chronic airflow limitation) ([place]) 01/20/2007  . Late effects of cerebrovascular disease 01/10/2007  . Essential (primary) hypertension 12/22/2006    Past Medical History:  Diagnosis Date  . Abnormal CT lung screening 10/17/2015  . COPD (chronic obstructive pulmonary disease) ([place])   . Coronary artery disease, non-occlusive    a. cath 2006: min nonobs CAD; b. cath 12/2010: cath LAD 50%, RCA 60%; c. 08/2013: Minimal luminal irregs, right dominant system with no significant CAD, diffuse luminal irregs noted. Normal EF 55%, no AS or MS.   . Diabetes mellitus   . Hyperlipemia    Followed by Dr. Rosanna Randy  . Hypertension   . Lung cancer ([place])   .  Macular degeneration    rt  . Personal history of tobacco use, presenting hazards to health 10/15/2015  . Pneumonia    hx  . Shortness of breath   . Stroke [facility])     Social History   Social History  . Marital status: Married    Spouse name: N/A  . Number of children: N/A  . Years of education: N/A   Occupational History  . Retired, Environmental consultant Retired   Social History Main Topics  . Smoking status: Former Smoker    Packs/day: 1.00    Years: 50.00    Types: Cigarettes    Quit date: 10/03/2015  . Smokeless tobacco: Never Used     Comment: smokes 3 cigs daily 05/06/15. Pt instructed to quit.  . Alcohol use No  . Drug use: No  . Sexual activity: No   Other Topics Concern  . Not on file   Social History Narrative   Married with 4 children   Gets  regular exercise    Allergies  Allergen Reactions  . Coconut Fatty Acids Swelling    Throat swells    Review of Systems  Constitutional: Positive for malaise/fatigue.  HENT: Positive for ear pain.   Eyes: Negative.   Respiratory: Positive for shortness of breath.   Cardiovascular: Negative.   Gastrointestinal: Negative.   Genitourinary: Negative.   Musculoskeletal: Positive for back pain.  Skin: Negative.   Neurological: Negative.   Endo/Heme/Allergies: Negative.   Psychiatric/Behavioral: Negative.     Immunization History  Administered Date(s) Administered  . Influenza Split 01/17/2010, 01/05/2012, 02/29/2012  . Influenza Whole 12/05/2009, 12/10/2010  . Influenza, High Dose Seasonal PF 02/14/2014, 01/01/2015  . Influenza,inj,Quad PF,36+ Mos 01/04/2013, 02/27/2013, 03/19/2016  . Influenza-Unspecified 02/04/2014  . Pneumococcal Conjugate-13 02/29/2012  . Pneumococcal Polysaccharide-23 01/05/2008, 01/05/2012  . Tdap 01/05/2012    Objective:  BP 132/64 (BP Location: Left Arm, Patient Position: Sitting, Cuff Size: Normal)   Pulse 62   Temp 97.7 F (36.5 C) (Oral)   Resp 16   Wt 157 lb (71.2 kg)   SpO2  92%   BMI 26.95 kg/m   Physical Exam  Constitutional: She is oriented to person, place, and time and well-developed, well-nourished, and in no distress.  HENT:  Head: Normocephalic and atraumatic.  Eyes: Conjunctivae and EOM are normal. Pupils are equal, round, and reactive to light.  Neck: Normal range of motion. Neck supple.  Cardiovascular: Normal rate, regular rhythm, normal heart sounds and intact distal pulses.   Pulmonary/Chest: Effort normal and breath sounds normal.  Musculoskeletal: Normal range of motion.  Neurological: She is alert and oriented to person, place, and time. She has normal reflexes. Gait normal. GCS score is 15.  Skin: Skin is warm and dry.  Psychiatric: Mood, memory, affect and judgment normal.    Lab Results  Component Value Date   WBC 5.9 07/14/2016   HGB 9.9 (L) 07/14/2016   HCT 28.9 (L) 07/14/2016   PLT 234 07/14/2016   GLUCOSE 131 (H) 07/30/2016   CHOL 186 08/19/2015   TRIG 226 (H) 08/19/2015   HDL 55 08/19/2015   LDLCALC 86 08/19/2015   TSH 3.136 07/14/2016   INR 0.89 01/27/2015   HGBA1C 8.0 04/30/2016   MICROALBUR 20 08/19/2015    CMP     Component Value Date/Time   NA 140 07/30/2016 1643   K 5.3 (H) 07/30/2016 1643   K 3.9 12/16/2011 1232   CL 98 07/30/2016 1643   CO2 26 07/30/2016 1643   GLUCOSE 131 (H) 07/30/2016 1643   GLUCOSE 216 (H) 07/16/2016 0514   BUN 22 07/30/2016 1643   CREATININE 1.80 (H) 07/30/2016 1643   CALCIUM 9.6 07/30/2016 1643   PROT 7.3 03/02/2016 1057   PROT 6.8 08/19/2015 1434   ALBUMIN 4.1 07/30/2016 1643   AST 13 (L) 03/02/2016 1057   ALT 11 (L) 03/02/2016 1057   ALKPHOS 67 03/02/2016 1057   BILITOT 0.4 03/02/2016 1057   BILITOT 0.2 08/19/2015 1434   GFRNONAA 27 (L) 07/30/2016 1643   GFRAA 32 (L) 07/30/2016 1643    Assessment and Plan :  1. Type 2 diabetes mellitus with diabetic nephropathy, without long-term current use of insulin (HCC)  - POCT HgB A1C 7.4 today. Better, Pt to continue current  medications.   2. Diabetic polyneuropathy associated with other specified diabetes mellitus ([place])   3. Spondylarthritis ([place])   4. Former tobacco use 5.Lung Cancer  HPI, Exam, and A&P Transcribed under the direction and in the presence  of Yavuz Kirby L. Cranford Mon, MD  Electronically Signed: Katina Dung, Jackson MD [facility] 08/26/2016 2:06 PM

## 2016-08-26 NOTE — Patient Instructions (Signed)
Stop potassium.

## 2016-08-27 DIAGNOSIS — M479 Spondylosis, unspecified: Secondary | ICD-10-CM | POA: Diagnosis not present

## 2016-08-27 LAB — VAS US CAROTID
LCCADDIAS: -20 cm/s
LCCAPSYS: 107 cm/s
LEFT ECA DIAS: -31 cm/s
LEFT VERTEBRAL DIAS: -24 cm/s
Left CCA dist sys: -83 cm/s
Left CCA prox dias: 20 cm/s
Left ICA dist dias: -31 cm/s
Left ICA dist sys: -117 cm/s
Left ICA prox dias: -34 cm/s
Left ICA prox sys: -155 cm/s
RCCAPDIAS: 17 cm/s
RIGHT ECA DIAS: 0 cm/s
RIGHT VERTEBRAL DIAS: -22 cm/s
Right CCA prox sys: 91 cm/s

## 2016-08-31 DIAGNOSIS — M479 Spondylosis, unspecified: Secondary | ICD-10-CM | POA: Diagnosis not present

## 2016-09-01 ENCOUNTER — Encounter: Payer: Self-pay | Admitting: Physical Therapy

## 2016-09-02 ENCOUNTER — Other Ambulatory Visit: Payer: Self-pay

## 2016-09-02 MED ORDER — TRAMADOL HCL 50 MG PO TABS: 50.0000 mg | ORAL_TABLET | Freq: Four times a day (QID) | ORAL | 1 refills | Status: DC | PRN

## 2016-09-02 NOTE — Telephone Encounter (Signed)
Tramadol entered in ERROR. Discontinued medication on med list.

## 2016-09-03 ENCOUNTER — Encounter: Payer: Self-pay | Admitting: Physician Assistant

## 2016-09-03 ENCOUNTER — Ambulatory Visit (INDEPENDENT_AMBULATORY_CARE_PROVIDER_SITE_OTHER): Payer: Medicare Other | Admitting: Physician Assistant

## 2016-09-03 VITALS — BP 110/52 | HR 94 | Temp 98.3°F | Resp 24 | Wt 159.0 lb

## 2016-09-03 DIAGNOSIS — H60592 Other noninfective acute otitis externa, left ear: Secondary | ICD-10-CM | POA: Diagnosis not present

## 2016-09-03 DIAGNOSIS — M479 Spondylosis, unspecified: Secondary | ICD-10-CM | POA: Diagnosis not present

## 2016-09-03 DIAGNOSIS — H66002 Acute suppurative otitis media without spontaneous rupture of ear drum, left ear: Secondary | ICD-10-CM

## 2016-09-03 MED ORDER — AMOXICILLIN 875 MG PO TABS: 875.0000 mg | ORAL_TABLET | Freq: Two times a day (BID) | ORAL | 0 refills | Status: AC

## 2016-09-03 MED ORDER — OFLOXACIN 0.3 % OT SOLN: 10.0000 [drp] | Freq: Every day | OTIC | 0 refills | Status: DC

## 2016-09-03 NOTE — Progress Notes (Signed)
Patient: [name] [name] Female    DOB: 06-17-1940   76 y.o.   MRN: [id number] Visit Date: 09/03/2016  Today's Provider: Trinna Post, PA-C   Chief Complaint  Patient presents with  . Sore Throat   Subjective:    Sore Throat   This is a new problem. The current episode started in the past 7 days (about 3 days). The problem has been unchanged. There has been no fever. The pain is mild. Associated symptoms include congestion, coughing, ear pain and trouble swallowing. She has had no exposure to strep. She has tried acetaminophen for the symptoms. The treatment provided mild relief.   [name] [name] is a 76 y/o woman with history of lung cancer, NSTEMI, and COPD presenting today for left ear pain and sore throat. Symptoms perisstent. Left ear pain to touch and also on the inside. Some drainage. Cleans with hydrogen peroxide. Persisting sore throat. Some itchy watery eyes too. No fevers, chills, nausea, vomtiing, SOB, problems breathing over borderline.      Allergies  Allergen Reactions  . Coconut Fatty Acids Swelling    Throat swells     Current Outpatient Prescriptions:  .  albuterol (PROVENTIL) (5 MG/ML) 0.5% nebulizer solution, Take 0.5 mLs (2.5 mg total) by nebulization 2 (two) times daily., Disp: 20 mL, Rfl: 11 .  atorvastatin (LIPITOR) 20 MG tablet, Take 1 tablet by mouth at  bedtime, Disp: 90 tablet, Rfl: 3 .  budesonide-formoterol (SYMBICORT) 160-4.5 MCG/ACT inhaler, Inhale 2 puffs into the lungs 2 (two) times daily., Disp: 1 Inhaler, Rfl: 0 .  diazepam (VALIUM) 5 MG tablet, Take 1 tablet (5 mg total) by mouth every 6 (six) hours as needed for muscle spasms., Disp: 30 tablet, Rfl: 5 .  gabapentin (NEURONTIN) 600 MG tablet, Take 0.5 tablets (300 mg total) by mouth 2 (two) times daily., Disp: 60 tablet, Rfl: 2 .  glimepiride (AMARYL) 2 MG tablet, Take 1 tablet (2 mg total) by mouth 2 (two) times daily., Disp: 60 tablet, Rfl: 2 .  HYDROcodone-acetaminophen (NORCO) 5-325  MG tablet, Take 1 tablet by mouth every 6 (six) hours as needed for moderate pain or severe pain., Disp: 20 tablet, Rfl: 0 .  isosorbide mononitrate (IMDUR) 30 MG 24 hr tablet, Take 1 tablet by mouth  daily, Disp: 90 tablet, Rfl: 3 .  magnesium oxide (MAG-OX) 400 (241.3 MG) MG tablet, Take 1 tablet by mouth 2 (two) times daily., Disp: , Rfl:  .  metoprolol tartrate (LOPRESSOR) 25 MG tablet, Take 1 tablet (25 mg total) by mouth 2 (two) times daily., Disp: 180 tablet, Rfl: 3 .  Multiple Vitamins-Minerals (ICAPS) CAPS, Take 2 capsules by mouth daily. Reported on 04/17/2015, Disp: , Rfl:  .  nitroGLYCERIN (NITROSTAT) 0.4 MG SL tablet, Place 1 tablet (0.4 mg total) under the tongue every 5 (five) minutes as needed for chest pain., Disp: 25 tablet, Rfl: 3 .  ondansetron (ZOFRAN) 8 MG tablet, Take 1 tablet (8 mg total) by mouth every 8 (eight) hours as needed for nausea or vomiting., Disp: 30 tablet, Rfl: 1 .  ONE TOUCH ULTRA TEST test strip, USE TO CHECK BLOOD SUGAR ONCE DAILY, Disp: 100 each, Rfl: 11 .  pantoprazole (PROTONIX) 40 MG tablet, Take 1 tablet by mouth  daily, Disp: 90 tablet, Rfl: 3 .  potassium chloride (K-DUR) 10 MEQ tablet, Take 3 tablets by mouth  daily as directed (Patient taking differently: Take 1 tablet by mouth 3 times daily), Disp: 270 tablet,  Rfl: 1 .  methylPREDNISolone (MEDROL DOSEPAK) 4 MG TBPK tablet, Use as directed, Disp: 21 tablet, Rfl: 0 No current facility-administered medications for this visit.   Facility-Administered Medications Ordered in Other Visits:  .  ondansetron (ZOFRAN) 8 mg, dexamethasone (DECADRON) 10 mg in sodium chloride 0.9 % 50 mL IVPB, , Intravenous, Once, Finnegan, Kathlene November, MD  Review of Systems  HENT: Positive for congestion, ear pain and trouble swallowing.   Respiratory: Positive for cough.     Social History  Substance Use Topics  . Smoking status: Former Smoker    Packs/day: 1.00    Years: 50.00    Types: Cigarettes    Quit date:  10/03/2015  . Smokeless tobacco: Never Used     Comment: smokes 3 cigs daily 05/06/15. Pt instructed to quit.  . Alcohol use No   Objective:   BP (!) 110/52 (BP Location: Right Arm, Patient Position: Sitting, Cuff Size: Normal)   Pulse 94   Temp 98.3 F (36.8 C)   Resp (!) 24   Wt 159 lb (72.1 kg)   SpO2 92%   BMI 27.29 kg/m  Vitals:   09/03/16 1530  BP: (!) 110/52  Pulse: 94  Resp: (!) 24  Temp: 98.3 F (36.8 C)  SpO2: 92%  Weight: 159 lb (72.1 kg)     Physical Exam  Constitutional: She is oriented to person, place, and time. She appears well-developed. No distress.  HENT:  Right Ear: No tenderness. No middle ear effusion.  Left Ear: There is tenderness. A middle ear effusion is present.  Mouth/Throat: Oropharynx is clear and moist.  Left TM with suppurative effusion. Tragal tenderness and pain with manipulation of left auricle. Xerosis of canal, almost eczematous appearing.   Neurological: She is alert and oriented to person, place, and time.        Assessment & Plan:     1. Acute suppurative otitis media of left ear without spontaneous rupture of tympanic membrane, recurrence not specified  Treat as below since no perforation that would be covered by ofloxacin.   - amoxicillin (AMOXIL) 875 MG tablet; Take 1 tablet (875 mg total) by mouth 2 (two) times daily.  Dispense: 14 tablet; Refill: 0  2. Other noninfective acute otitis externa of left ear  Try to stop cleaning with hydrogen peroxide.  - ofloxacin (FLOXIN OTIC) 0.3 % otic solution; Place 10 drops into the left ear daily.  Dispense: 5 mL; Refill: 0  Return if symptoms worsen or fail to improve.  The entirety of the information documented in the History of Present Illness, Review of Systems and Physical Exam were personally obtained by me. Portions of this information were initially documented by Wilburt Finlay and reviewed by me for thoroughness and accuracy.            Trinna Post, PA-C    [facility]

## 2016-09-03 NOTE — Patient Instructions (Signed)
Otitis Media, Adult Otitis media is redness, soreness, and puffiness (swelling) in the space just behind your eardrum (middle ear). It may be caused by allergies or infection. It often happens along with a cold. Follow these instructions at home:  Take your medicine as told. Finish it even if you start to feel better.  Only take over-the-counter or prescription medicines for pain, discomfort, or fever as told by your doctor.  Follow up with your doctor as told. Contact a doctor if:  You have otitis media only in one ear, or bleeding from your nose, or both.  You notice a lump on your neck.  You are not getting better in 3-5 days.  You feel worse instead of better. Get help right away if:  You have pain that is not helped with medicine.  You have puffiness, redness, or pain around your ear.  You get a stiff neck.  You cannot move part of your face (paralysis).  You notice that the bone behind your ear hurts when you touch it. This information is not intended to replace advice given to you by your health care provider. Make sure you discuss any questions you have with your health care provider. Document Released: 09/09/2007 Document Revised: 08/29/2015 Document Reviewed: 10/18/2012 Elsevier Interactive Patient Education  2017 [employer]  

## 2016-09-07 DIAGNOSIS — M479 Spondylosis, unspecified: Secondary | ICD-10-CM | POA: Diagnosis not present

## 2016-09-08 ENCOUNTER — Ambulatory Visit
Admission: RE | Admit: 2016-09-08 | Discharge: 2016-09-08 | Disposition: A | Discharge status: Home / Self Care | Payer: Medicare Other | Source: Ambulatory Visit | Attending: Oncology | Admitting: Oncology

## 2016-09-08 DIAGNOSIS — R918 Other nonspecific abnormal finding of lung field: Secondary | ICD-10-CM | POA: Diagnosis not present

## 2016-09-08 DIAGNOSIS — C7491 Malignant neoplasm of unspecified part of right adrenal gland: Secondary | ICD-10-CM | POA: Diagnosis not present

## 2016-09-08 DIAGNOSIS — J449 Chronic obstructive pulmonary disease, unspecified: Secondary | ICD-10-CM | POA: Diagnosis not present

## 2016-09-08 DIAGNOSIS — C7492 Malignant neoplasm of unspecified part of left adrenal gland: Secondary | ICD-10-CM | POA: Diagnosis not present

## 2016-09-08 DIAGNOSIS — I700 Atherosclerosis of aorta: Secondary | ICD-10-CM | POA: Diagnosis not present

## 2016-09-08 DIAGNOSIS — I2510 Atherosclerotic heart disease of native coronary artery without angina pectoris: Secondary | ICD-10-CM | POA: Insufficient documentation

## 2016-09-08 DIAGNOSIS — C3411 Malignant neoplasm of upper lobe, right bronchus or lung: Secondary | ICD-10-CM

## 2016-09-08 DIAGNOSIS — J432 Centrilobular emphysema: Secondary | ICD-10-CM | POA: Diagnosis not present

## 2016-09-09 DIAGNOSIS — M479 Spondylosis, unspecified: Secondary | ICD-10-CM | POA: Diagnosis not present

## 2016-09-14 DIAGNOSIS — M479 Spondylosis, unspecified: Secondary | ICD-10-CM | POA: Diagnosis not present

## 2016-09-16 ENCOUNTER — Inpatient Hospital Stay: Payer: Medicare Other | Attending: Oncology

## 2016-09-16 DIAGNOSIS — I10 Essential (primary) hypertension: Secondary | ICD-10-CM | POA: Diagnosis not present

## 2016-09-16 DIAGNOSIS — N289 Disorder of kidney and ureter, unspecified: Secondary | ICD-10-CM | POA: Diagnosis not present

## 2016-09-16 DIAGNOSIS — I700 Atherosclerosis of aorta: Secondary | ICD-10-CM | POA: Diagnosis not present

## 2016-09-16 DIAGNOSIS — Z85118 Personal history of other malignant neoplasm of bronchus and lung: Secondary | ICD-10-CM | POA: Diagnosis present

## 2016-09-16 DIAGNOSIS — C3411 Malignant neoplasm of upper lobe, right bronchus or lung: Secondary | ICD-10-CM | POA: Diagnosis not present

## 2016-09-16 DIAGNOSIS — Z9049 Acquired absence of other specified parts of digestive tract: Secondary | ICD-10-CM | POA: Insufficient documentation

## 2016-09-16 DIAGNOSIS — D649 Anemia, unspecified: Secondary | ICD-10-CM | POA: Diagnosis not present

## 2016-09-16 DIAGNOSIS — Z95828 Presence of other vascular implants and grafts: Secondary | ICD-10-CM

## 2016-09-16 DIAGNOSIS — Z8673 Personal history of transient ischemic attack (TIA), and cerebral infarction without residual deficits: Secondary | ICD-10-CM | POA: Diagnosis not present

## 2016-09-16 DIAGNOSIS — Z87891 Personal history of nicotine dependence: Secondary | ICD-10-CM | POA: Diagnosis not present

## 2016-09-16 DIAGNOSIS — E1165 Type 2 diabetes mellitus with hyperglycemia: Secondary | ICD-10-CM | POA: Diagnosis not present

## 2016-09-16 DIAGNOSIS — Z79899 Other long term (current) drug therapy: Secondary | ICD-10-CM | POA: Insufficient documentation

## 2016-09-16 DIAGNOSIS — J449 Chronic obstructive pulmonary disease, unspecified: Secondary | ICD-10-CM | POA: Insufficient documentation

## 2016-09-16 DIAGNOSIS — M479 Spondylosis, unspecified: Secondary | ICD-10-CM | POA: Diagnosis not present

## 2016-09-16 DIAGNOSIS — E785 Hyperlipidemia, unspecified: Secondary | ICD-10-CM | POA: Diagnosis not present

## 2016-09-16 DIAGNOSIS — Z452 Encounter for adjustment and management of vascular access device: Secondary | ICD-10-CM | POA: Diagnosis not present

## 2016-09-16 DIAGNOSIS — Z7984 Long term (current) use of oral hypoglycemic drugs: Secondary | ICD-10-CM | POA: Insufficient documentation

## 2016-09-16 DIAGNOSIS — Z8701 Personal history of pneumonia (recurrent): Secondary | ICD-10-CM | POA: Insufficient documentation

## 2016-09-16 DIAGNOSIS — I2510 Atherosclerotic heart disease of native coronary artery without angina pectoris: Secondary | ICD-10-CM | POA: Diagnosis not present

## 2016-09-16 DIAGNOSIS — Z801 Family history of malignant neoplasm of trachea, bronchus and lung: Secondary | ICD-10-CM | POA: Diagnosis not present

## 2016-09-16 MED ORDER — HEPARIN SOD (PORK) LOCK FLUSH 100 UNIT/ML IV SOLN
500.0000 [IU] | Freq: Once | INTRAVENOUS | Status: AC
  Administered 2016-09-16: 500 [IU] via INTRAVENOUS

## 2016-09-16 MED ORDER — SODIUM CHLORIDE 0.9% FLUSH
10.0000 mL | INTRAVENOUS | Status: DC | PRN
  Administered 2016-09-16: 10 mL via INTRAVENOUS
  Filled 2016-09-16: qty 10

## 2016-09-21 DIAGNOSIS — M479 Spondylosis, unspecified: Secondary | ICD-10-CM | POA: Diagnosis not present

## 2016-09-21 NOTE — Progress Notes (Signed)
[place]  [phone]) [phone] Fax:(336) [phone]  ID: [name] [name] OB: 05-06-1940  MR#: [id number]  WUX#:[id number]  Patient Care Team: Jerrol Banana., MD as PCP - General (Unknown Physician Specialty) [name], MD as Consulting Physician (Cardiology) [name], MD as Consulting Physician (Vascular Surgery)  CHIEF COMPLAINT: Clinical stage IIIa small cell lung carcinoma of the right upper lobe lung.  INTERVAL HISTORY: Patient returns to clinic today for further evaluation and discussion of her imaging results. She currently feels well and is back to her baseline. She has no neurologic complaints. She denies any fevers. She denies any chest pain, shortness of breath, cough, or hemoptysis. She has a good appetite and denies weight loss. She denies any nausea, vomiting, constipation, or diarrhea. She has no urinary complaints. Patient offers no specific complaints today.  REVIEW OF SYSTEMS:   Review of Systems  Constitutional: Negative for fever, malaise/fatigue and weight loss.  Respiratory: Negative for cough, hemoptysis and shortness of breath.   Cardiovascular: Negative.  Negative for chest pain and leg swelling.  Gastrointestinal: Negative for abdominal pain, melena, nausea and vomiting.  Genitourinary: Negative.   Musculoskeletal: Negative.   Neurological: Negative for sensory change and weakness.  Psychiatric/Behavioral: Negative.  The patient is not nervous/anxious.     As per HPI. Otherwise, a complete review of systems is negative.  PAST MEDICAL HISTORY: Past Medical History:  Diagnosis Date  . Abnormal CT lung screening 10/17/2015  . COPD (chronic obstructive pulmonary disease) ([place])   . Coronary artery disease, non-occlusive    a. cath 2006: min nonobs CAD; b. cath 12/2010: cath LAD 50%, RCA 60%; c. 08/2013: Minimal luminal irregs, right dominant system with no significant CAD, diffuse luminal irregs noted. Normal EF 55%, no AS  or MS.   . Diabetes mellitus   . Hyperlipemia    Followed by Dr. Rosanna Randy  . Hypertension   . Lung cancer ([place])   . Macular degeneration    rt  . Personal history of tobacco use, presenting hazards to health 10/15/2015  . Pneumonia    hx  . Shortness of breath   . Stroke [facility])     PAST SURGICAL HISTORY: Past Surgical History:  Procedure Laterality Date  . ABDOMINAL HYSTERECTOMY    . ANTERIOR CERVICAL DECOMP/DISCECTOMY FUSION N/A 10/19/2013   Procedure: CERVICAL FIVE-SIX ANTERIOR CERVICAL DECOMPRESSION WITH FUSION INTERBODY PROSTHESIS PLATING AND PEEK CAGE;  Surgeon: Ophelia Charter, MD;  Location: [facility];  Service: Neurosurgery;  Laterality: N/A;  . BACK SURGERY  80's  . BREAST CYST EXCISION Left    left negative   . CARDIAC CATHETERIZATION  05/2004  . CATARACT EXTRACTION Left   . CHOLECYSTECTOMY    . ENDARTERECTOMY Left 10/24/2014   Procedure: ENDARTERECTOMY CAROTID;  Surgeon: Algernon Huxley, MD;  Location: [facility] ORS;  Service: Vascular;  Laterality: Left;  . ENDOBRONCHIAL ULTRASOUND N/A 11/14/2015   Procedure: ENDOBRONCHIAL ULTRASOUND;  Surgeon: [name], MD;  Location: [facility] ORS;  Service: Cardiopulmonary;  Laterality: N/A;  . PERIPHERAL VASCULAR CATHETERIZATION N/A 12/04/2015   Procedure: Glori Luis Cath Insertion;  Surgeon: Algernon Huxley, MD;  Location: [facility] CV LAB;  Service: Cardiovascular;  Laterality: N/A;  . TONSILLECTOMY AND ADENOIDECTOMY    . VESICOVAGINAL FISTULA CLOSURE W/ TAH      FAMILY HISTORY: Family History  Problem Relation Age of Onset  . Stroke Mother        Massive  . Heart attack Father  Massive  . Heart attack Brother   . Heart attack Brother   . Lung cancer Maternal Grandfather   . Heart attack Paternal Grandmother        MI       ADVANCED DIRECTIVES (Y/N):  N   HEALTH MAINTENANCE: Social History  Substance Use Topics  . Smoking status: Former Smoker    Packs/day: 1.00    Years: 50.00    Types: Cigarettes    Quit date:  10/03/2015  . Smokeless tobacco: Never Used     Comment: smokes 3 cigs daily 05/06/15. Pt instructed to quit.  . Alcohol use No     Colonoscopy:  PAP:  Bone density:  Lipid panel:  Allergies  Allergen Reactions  . Coconut Fatty Acids Swelling    Throat swells    Current Outpatient Prescriptions  Medication Sig Dispense Refill  . albuterol (PROVENTIL) (5 MG/ML) 0.5% nebulizer solution Take 0.5 mLs (2.5 mg total) by nebulization 2 (two) times daily. 20 mL 11  . atorvastatin (LIPITOR) 20 MG tablet Take 1 tablet by mouth at  bedtime 90 tablet 3  . budesonide-formoterol (SYMBICORT) 160-4.5 MCG/ACT inhaler Inhale 2 puffs into the lungs 2 (two) times daily. 1 Inhaler 0  . diazepam (VALIUM) 5 MG tablet Take 1 tablet (5 mg total) by mouth every 6 (six) hours as needed for muscle spasms. 30 tablet 5  . gabapentin (NEURONTIN) 600 MG tablet Take 0.5 tablets (300 mg total) by mouth 2 (two) times daily. 60 tablet 2  . glimepiride (AMARYL) 2 MG tablet Take 1 tablet (2 mg total) by mouth 2 (two) times daily. 60 tablet 2  . HYDROcodone-acetaminophen (NORCO) 5-325 MG tablet Take 1 tablet by mouth every 6 (six) hours as needed for moderate pain or severe pain. 20 tablet 0  . isosorbide mononitrate (IMDUR) 30 MG 24 hr tablet Take 1 tablet by mouth  daily 90 tablet 3  . magnesium oxide (MAG-OX) 400 (241.3 MG) MG tablet Take 1 tablet by mouth 2 (two) times daily.    . metoprolol tartrate (LOPRESSOR) 25 MG tablet Take 1 tablet (25 mg total) by mouth 2 (two) times daily. 180 tablet 3  . Multiple Vitamins-Minerals (ICAPS) CAPS Take 2 capsules by mouth daily. Reported on 04/17/2015    . nitroGLYCERIN (NITROSTAT) 0.4 MG SL tablet Place 1 tablet (0.4 mg total) under the tongue every 5 (five) minutes as needed for chest pain. 25 tablet 3  . ondansetron (ZOFRAN) 8 MG tablet Take 1 tablet (8 mg total) by mouth every 8 (eight) hours as needed for nausea or vomiting. 30 tablet 1  . ONE TOUCH ULTRA TEST test strip USE TO  CHECK BLOOD SUGAR ONCE DAILY 100 each 11  . pantoprazole (PROTONIX) 40 MG tablet Take 1 tablet by mouth  daily 90 tablet 3  . levofloxacin (LEVAQUIN) 500 MG tablet Take 1 tablet (500 mg total) by mouth daily. 5 tablet 0   No current facility-administered medications for this visit.    Facility-Administered Medications Ordered in Other Visits  Medication Dose Route Frequency Provider Last Rate Last Dose  . ondansetron (ZOFRAN) 8 mg, dexamethasone (DECADRON) 10 mg in sodium chloride 0.9 % 50 mL IVPB   Intravenous Once Lloyd Huger, MD        OBJECTIVE: Vitals:   09/22/16 1404  BP: 133/61  Pulse: (!) 58  Temp: 98.7 F (37.1 C)     Body mass index is 26.13 kg/m.    ECOG FS:1 - Symptomatic but completely  ambulatory  General: Well-developed, well-nourished, no acute distress. Eyes: Pink conjunctiva, anicteric sclera. Lungs: Clear to auscultation bilaterally. Heart: Regular rate and rhythm. No rubs, murmurs, or gallops. Abdomen: Soft, nontender, nondistended. No organomegaly noted, normoactive bowel sounds. Musculoskeletal: No edema, cyanosis, or clubbing. Neuro: Alert, answering all questions appropriately. Cranial nerves grossly intact. Skin: No rashes or petechiae noted. Psych: Normal affect.   LAB RESULTS:  Lab Results  Component Value Date   NA 140 07/30/2016   K 5.3 (H) 07/30/2016   CL 98 07/30/2016   CO2 26 07/30/2016   GLUCOSE 131 (H) 07/30/2016   BUN 22 07/30/2016   CREATININE 1.80 (H) 07/30/2016   CALCIUM 9.6 07/30/2016   PROT 7.3 03/02/2016   ALBUMIN 4.1 07/30/2016   AST 13 (L) 03/02/2016   ALT 11 (L) 03/02/2016   ALKPHOS 67 03/02/2016   BILITOT 0.4 03/02/2016   GFRNONAA 27 (L) 07/30/2016   GFRAA 32 (L) 07/30/2016    Lab Results  Component Value Date   WBC 5.9 07/14/2016   NEUTROABS 4.4 07/14/2016   HGB 9.9 (L) 07/14/2016   HCT 28.9 (L) 07/14/2016   MCV 96.4 07/14/2016   PLT 234 07/14/2016     STUDIES: Ct Chest Wo Contrast  Result Date:  09/08/2016 CLINICAL DATA:  76 year old female with history of lung cancer. Followup evaluation. EXAM: CT CHEST WITHOUT CONTRAST TECHNIQUE: Multidetector CT imaging of the chest was performed following the standard protocol without IV contrast. COMPARISON:  Chest CT 06/16/2016. FINDINGS: Cardiovascular: Heart size is normal. There is no significant pericardial fluid, thickening or pericardial calcification. There is aortic atherosclerosis, as well as atherosclerosis of the great vessels of the mediastinum and the coronary arteries, including calcified atherosclerotic plaque in the left anterior descending, left circumflex and right coronary arteries. Left internal jugular single-lumen porta cath with tip terminating at the superior cavoatrial junction. Mediastinum/Nodes: No pathologically enlarged mediastinal or hilar lymph nodes. Please note that accurate exclusion of hilar adenopathy is limited on noncontrast CT scans. Esophagus is unremarkable in appearance. No axillary lymphadenopathy. Lungs/Pleura: There continue to be some areas of mild pleural thickening and nodularity in the posterior aspect of the right lung, most evident in the right upper lobe (image 65 of series 3) where there is a 5 mm subpleural nodule. These areas are nonspecific, and slightly different than the prior study (on the previous examination there was a more lateral area of pleural thickening/nodularity, whereas today's region is more medial). No larger more suspicious appearing pulmonary nodules or masses are noted. Mild diffuse bronchial wall thickening with moderate centrilobular and mild paraseptal emphysema. No acute consolidative airspace disease. No pleural effusions. Areas of mild linear scarring noted in the medial segment of the right middle lobe, medial aspect of the left upper lobe and inferior segment of the lingula. Upper Abdomen: 3.4 x 1.9 cm low-attenuation (-7 HU) left adrenal nodule, similar to prior examinations, compatible  with an adenoma. 2 cm right adrenal low-attenuation nodule (2 HU) also compatible with an adenoma. Status post cholecystectomy. Musculoskeletal: There are no aggressive appearing lytic or blastic lesions noted in the visualized portions of the skeleton. IMPRESSION: 1. No definite findings to suggest residual/recurrent disease. There continues to be some nonspecific pleural thickening/nodularity in the posterior aspect of the right lung, close to the region of the originally treated lesion. These findings are nonspecific but continued attention on followup studies is recommended. 2. Diffuse bronchial wall thickening with moderate centrilobular and mild paraseptal emphysema; imaging findings compatible with reported clinical history of  underlying COPD. 3. Aortic atherosclerosis, in addition to three-vessel coronary artery disease. Assessment for potential risk factor modification, dietary therapy or pharmacologic therapy may be warranted, if clinically indicated. 4. Bilateral adrenal adenomas similar to prior study. 5. Additional incidental findings, as above. Electronically Signed   By: Vinnie Langton M.D.   On: 09/08/2016 14:28    ASSESSMENT: Clinical stage IIIa small cell lung carcinoma of the right upper lobe lung.  PLAN:    1. Clinical stage IIIa small cell lung carcinoma of the right upper lobe lung: CT scan results from September 08, 2016 reviewed independently and reported as above with no obvious evidence of persistent or recurrent disease. MRI the brain is negative and patient has completed PCI. Patient completed cycle 3 of cisplatin and etoposide on October 24-26, 2017. Given patient's extensive hospitalization after cycle 3, treatment was discontinued. No further intervention is needed at this time. Return to clinic in 3 months with repeat CT scan and further evaluation. 2. Anemia: Decreased, but stable. No intervention is needed at this time. Monitor. 3. Renal insufficiency: Patient's creatinine  appears to be at her baseline, monitor. 4. Hyperglycemia: Elevated but improved. Monitor.   Patient expressed understanding and was in agreement with this plan. She also understands that She can call clinic at any time with any questions, concerns, or complaints.   Cancer Staging Primary cancer of right upper lobe of lung [facility]) Staging form: Lung, AJCC 7th Edition - Clinical stage from 11/26/2015: Stage IIIA (T1a, N2, M0) - Signed by Lloyd Huger, MD on 11/26/2015   Lloyd Huger, MD   09/24/2016 11:48 AM

## 2016-09-22 ENCOUNTER — Inpatient Hospital Stay: Payer: Medicare Other

## 2016-09-22 ENCOUNTER — Ambulatory Visit (INDEPENDENT_AMBULATORY_CARE_PROVIDER_SITE_OTHER): Payer: Medicare Other | Admitting: Family Medicine

## 2016-09-22 ENCOUNTER — Inpatient Hospital Stay (HOSPITAL_BASED_OUTPATIENT_CLINIC_OR_DEPARTMENT_OTHER): Payer: Medicare Other | Admitting: Oncology

## 2016-09-22 VITALS — BP 133/61 | HR 58 | Temp 98.7°F | Wt 152.2 lb

## 2016-09-22 VITALS — BP 128/64 | HR 60 | Temp 97.9°F | Resp 12 | Wt 153.0 lb

## 2016-09-22 DIAGNOSIS — Z801 Family history of malignant neoplasm of trachea, bronchus and lung: Secondary | ICD-10-CM | POA: Diagnosis not present

## 2016-09-22 DIAGNOSIS — E785 Hyperlipidemia, unspecified: Secondary | ICD-10-CM | POA: Diagnosis not present

## 2016-09-22 DIAGNOSIS — J449 Chronic obstructive pulmonary disease, unspecified: Secondary | ICD-10-CM | POA: Diagnosis not present

## 2016-09-22 DIAGNOSIS — I700 Atherosclerosis of aorta: Secondary | ICD-10-CM

## 2016-09-22 DIAGNOSIS — Z7984 Long term (current) use of oral hypoglycemic drugs: Secondary | ICD-10-CM

## 2016-09-22 DIAGNOSIS — E1165 Type 2 diabetes mellitus with hyperglycemia: Secondary | ICD-10-CM | POA: Diagnosis not present

## 2016-09-22 DIAGNOSIS — Z8701 Personal history of pneumonia (recurrent): Secondary | ICD-10-CM

## 2016-09-22 DIAGNOSIS — Z79899 Other long term (current) drug therapy: Secondary | ICD-10-CM

## 2016-09-22 DIAGNOSIS — I2510 Atherosclerotic heart disease of native coronary artery without angina pectoris: Secondary | ICD-10-CM | POA: Diagnosis not present

## 2016-09-22 DIAGNOSIS — Z9049 Acquired absence of other specified parts of digestive tract: Secondary | ICD-10-CM

## 2016-09-22 DIAGNOSIS — Z452 Encounter for adjustment and management of vascular access device: Secondary | ICD-10-CM

## 2016-09-22 DIAGNOSIS — Z8673 Personal history of transient ischemic attack (TIA), and cerebral infarction without residual deficits: Secondary | ICD-10-CM | POA: Diagnosis not present

## 2016-09-22 DIAGNOSIS — N289 Disorder of kidney and ureter, unspecified: Secondary | ICD-10-CM

## 2016-09-22 DIAGNOSIS — I10 Essential (primary) hypertension: Secondary | ICD-10-CM

## 2016-09-22 DIAGNOSIS — H60502 Unspecified acute noninfective otitis externa, left ear: Secondary | ICD-10-CM

## 2016-09-22 DIAGNOSIS — Z87891 Personal history of nicotine dependence: Secondary | ICD-10-CM

## 2016-09-22 DIAGNOSIS — D649 Anemia, unspecified: Secondary | ICD-10-CM

## 2016-09-22 DIAGNOSIS — C3411 Malignant neoplasm of upper lobe, right bronchus or lung: Secondary | ICD-10-CM | POA: Diagnosis not present

## 2016-09-22 MED ORDER — LEVOFLOXACIN 500 MG PO TABS: 500.0000 mg | ORAL_TABLET | Freq: Every day | ORAL | 0 refills | Status: DC

## 2016-09-22 NOTE — Progress Notes (Signed)
Patient here today for follow up.  Patient states no new concerns today  

## 2016-09-22 NOTE — Progress Notes (Signed)
[name] [name]  MRN: [id number] DOB: Mar 08, 1941  Subjective:  HPI  Patient is here to follow up on left ear pain. She saw Adriana on may 31 and was told to use Floxacin drops which she has and took Amoxicillin prescribed. Pain is still present some and ear feels stopped up, can not hear well. Feels like both ears draining. Also noticed ears have felt scaly and the back of her neck too. No blood seen. No fever. Has had some dizziness. Patient Active Problem List   Diagnosis Date Noted  . AKI (acute kidney injury) ([place]) 07/15/2016  . Protein-calorie malnutrition, severe 02/08/2016  . Acute renal failure (ARF) ([place]) 02/05/2016  . Primary cancer of right upper lobe of lung ([place]) 11/20/2015  . Adenopathy   . Abnormal CT lung screening 10/17/2015  . Personal history of tobacco use, presenting hazards to health 10/15/2015  . NSTEMI (non-ST elevated myocardial infarction) ([place]) 01/27/2015  . Chronic vulvitis 09/26/2014  . Allergic reaction 09/26/2014  . Carotid stenosis 08/30/2014  . Cervical nerve root disorder 08/10/2014  . Absolute anemia 08/10/2014  . CAD in native artery 08/10/2014  . B12 deficiency 08/10/2014  . Back ache 08/10/2014  . Bronchitis, chronic ([place]) 08/10/2014  . Diabetes mellitus with polyneuropathy ([place]) 08/10/2014  . Can't get food down 08/10/2014  . Eczema of external ear 08/10/2014  . Accumulation of fluid in tissues 08/10/2014  . Gout 08/10/2014  . Adult hypothyroidism 08/10/2014  . Mononeuritis 08/10/2014  . Muscle ache 08/10/2014  . Disorder of peripheral nervous system 08/10/2014  . Lesion of vulva 08/10/2014  . Cervical spondylosis with radiculopathy 10/19/2013  . Unstable angina ([place]) 08/15/2013  . COPD exacerbation ([place]) 03/29/2013  . CAD (coronary artery disease) 06/22/2011  . COPD (chronic obstructive pulmonary disease) with emphysema ([place]) 03/07/2010  . CHEST PAIN UNSPECIFIED 07/22/2009  . HLD (hyperlipidemia) 04/01/2009  . Malaise and fatigue  04/01/2009  . Hyperlipidemia 01/18/2009  . TOBACCO ABUSE 01/18/2009  . HYPERTENSION, BENIGN 01/18/2009  . CLAUDICATION 01/18/2009  . Pain in limb 01/18/2009  . CAFL (chronic airflow limitation) ([place]) 01/20/2007  . Late effects of cerebrovascular disease 01/10/2007  . Essential (primary) hypertension 12/22/2006    Past Medical History:  Diagnosis Date  . Abnormal CT lung screening 10/17/2015  . COPD (chronic obstructive pulmonary disease) ([place])   . Coronary artery disease, non-occlusive    a. cath 2006: min nonobs CAD; b. cath 12/2010: cath LAD 50%, RCA 60%; c. 08/2013: Minimal luminal irregs, right dominant system with no significant CAD, diffuse luminal irregs noted. Normal EF 55%, no AS or MS.   . Diabetes mellitus   . Hyperlipemia    Followed by Dr. Rosanna Randy  . Hypertension   . Lung cancer ([place])   . Macular degeneration    rt  . Personal history of tobacco use, presenting hazards to health 10/15/2015  . Pneumonia    hx  . Shortness of breath   . Stroke [facility])     Social History   Social History  . Marital status: Married    Spouse name: N/A  . Number of children: N/A  . Years of education: N/A   Occupational History  . Retired, Environmental consultant Retired   Social History Main Topics  . Smoking status: Former Smoker    Packs/day: 1.00    Years: 50.00    Types: Cigarettes    Quit date: 10/03/2015  . Smokeless tobacco: Never Used     Comment: smokes 3 cigs daily 05/06/15. Pt  instructed to quit.  . Alcohol use No  . Drug use: No  . Sexual activity: No   Other Topics Concern  . Not on file   Social History Narrative   Married with 4 children   Gets regular exercise    Outpatient Encounter Prescriptions as of 09/22/2016  Medication Sig Note  . albuterol (PROVENTIL) (5 MG/ML) 0.5% nebulizer solution Take 0.5 mLs (2.5 mg total) by nebulization 2 (two) times daily.   Marland Kitchen atorvastatin (LIPITOR) 20 MG tablet Take 1 tablet by mouth at  bedtime   . budesonide-formoterol  (SYMBICORT) 160-4.5 MCG/ACT inhaler Inhale 2 puffs into the lungs 2 (two) times daily.   . diazepam (VALIUM) 5 MG tablet Take 1 tablet (5 mg total) by mouth every 6 (six) hours as needed for muscle spasms.   Marland Kitchen gabapentin (NEURONTIN) 600 MG tablet Take 0.5 tablets (300 mg total) by mouth 2 (two) times daily.   Marland Kitchen glimepiride (AMARYL) 2 MG tablet Take 1 tablet (2 mg total) by mouth 2 (two) times daily.   Marland Kitchen HYDROcodone-acetaminophen (NORCO) 5-325 MG tablet Take 1 tablet by mouth every 6 (six) hours as needed for moderate pain or severe pain.   . isosorbide mononitrate (IMDUR) 30 MG 24 hr tablet Take 1 tablet by mouth  daily   . magnesium oxide (MAG-OX) 400 (241.3 MG) MG tablet Take 1 tablet by mouth 2 (two) times daily.   . metoprolol tartrate (LOPRESSOR) 25 MG tablet Take 1 tablet (25 mg total) by mouth 2 (two) times daily.   . Multiple Vitamins-Minerals (ICAPS) CAPS Take 2 capsules by mouth daily. Reported on 04/17/2015   . nitroGLYCERIN (NITROSTAT) 0.4 MG SL tablet Place 1 tablet (0.4 mg total) under the tongue every 5 (five) minutes as needed for chest pain. 05/23/2015: As needed.   . ondansetron (ZOFRAN) 8 MG tablet Take 1 tablet (8 mg total) by mouth every 8 (eight) hours as needed for nausea or vomiting.   . ONE TOUCH ULTRA TEST test strip USE TO CHECK BLOOD SUGAR ONCE DAILY   . pantoprazole (PROTONIX) 40 MG tablet Take 1 tablet by mouth  daily    Facility-Administered Encounter Medications as of 09/22/2016  Medication  . ondansetron (ZOFRAN) 8 mg, dexamethasone (DECADRON) 10 mg in sodium chloride 0.9 % 50 mL IVPB    Allergies  Allergen Reactions  . Coconut Fatty Acids Swelling    Throat swells    Review of Systems  Constitutional: Positive for malaise/fatigue.  Eyes: Negative.   Respiratory: Negative.   Cardiovascular: Negative.   Gastrointestinal: Positive for nausea and vomiting. Negative for abdominal pain, blood in stool, constipation and diarrhea.  Musculoskeletal: Positive for  back pain.  Neurological: Positive for dizziness and weakness.       Unsteady, using a cane to ambulate. "legs wobble"  Endo/Heme/Allergies: Negative.   Psychiatric/Behavioral: Negative.     Objective:  BP 128/64   Pulse 60   Temp 97.9 F (36.6 C)   Resp 12   Wt 153 lb (69.4 kg)   BMI 26.26 kg/m   Physical Exam  Constitutional: She is oriented to person, place, and time and well-developed, well-nourished, and in no distress.  HENT:  Head: Normocephalic and atraumatic.  Right Ear: External ear normal.  Left Ear: External ear normal.  Nose: Nose normal.  Left EAC full of yelow drainage. Left pinna irritated,draining.  Eyes: Conjunctivae are normal. No scleral icterus.  Neck: Neck supple. No thyromegaly present.  Cardiovascular: Normal rate, regular rhythm and normal heart  sounds.   Pulmonary/Chest: Effort normal and breath sounds normal.  Abdominal: Soft.  Neurological: She is alert and oriented to person, place, and time.  Skin: Skin is warm and dry.  Psychiatric: Mood, memory, affect and judgment normal.    Assessment and Plan :  Otitis Externa Refer to ENT and Rx levaquin for 5 days pending appt. Lung Cancer TIIDM  I have done the exam and reviewed the chart and it is accurate to the best of my knowledge. Development worker, community has been used and  any errors in dictation or transcription are unintentional. Miguel Aschoff M.D. [facility]

## 2016-09-23 DIAGNOSIS — J449 Chronic obstructive pulmonary disease, unspecified: Secondary | ICD-10-CM | POA: Diagnosis not present

## 2016-09-23 DIAGNOSIS — R0602 Shortness of breath: Secondary | ICD-10-CM | POA: Diagnosis not present

## 2016-09-24 DIAGNOSIS — M479 Spondylosis, unspecified: Secondary | ICD-10-CM | POA: Diagnosis not present

## 2016-09-25 DIAGNOSIS — J449 Chronic obstructive pulmonary disease, unspecified: Secondary | ICD-10-CM | POA: Diagnosis not present

## 2016-10-08 ENCOUNTER — Ambulatory Visit (INDEPENDENT_AMBULATORY_CARE_PROVIDER_SITE_OTHER): Payer: Medicare Other | Admitting: Physician Assistant

## 2016-10-08 ENCOUNTER — Encounter: Payer: Self-pay | Admitting: Physician Assistant

## 2016-10-08 VITALS — BP 122/64 | HR 68 | Temp 98.4°F | Resp 16 | Wt 152.0 lb

## 2016-10-08 DIAGNOSIS — R21 Rash and other nonspecific skin eruption: Secondary | ICD-10-CM

## 2016-10-08 MED ORDER — NYSTATIN 100000 UNIT/GM EX CREA: 1.0000 "application " | TOPICAL_CREAM | Freq: Two times a day (BID) | CUTANEOUS | 0 refills | Status: DC

## 2016-10-08 MED ORDER — TRIAMCINOLONE ACETONIDE 0.1 % EX CREA: 1.0000 "application " | TOPICAL_CREAM | Freq: Two times a day (BID) | CUTANEOUS | 0 refills | Status: DC

## 2016-10-08 NOTE — Progress Notes (Signed)
Patient: [name] [name] Female    DOB: [birth year]   76 y.o.   MRN: [id number] Visit Date: 10/08/2016  Today's Provider: Trinna Post, PA-C   Chief Complaint  Patient presents with  . Rash   Subjective:      [name] [name] is a 76 y/o with history of Type II DM, lung cancer presenting today with neck rash ongoing for several days. She says it happened all of the sudden. It is itching. She has had exposure to new laundry detergent recently in the past week. No weeping, no plant exposures.   Rash  This is a recurrent problem. The current episode started in the past 7 days. The problem has been gradually worsening since onset. The affected locations include the neck. The rash is characterized by redness and itchiness. She was exposed to a new detergent/soap. Pertinent negatives include no anorexia, congestion, cough, diarrhea, eye pain, facial edema, fatigue, fever, joint pain, nail changes, rhinorrhea, shortness of breath, sore throat or vomiting.       Allergies  Allergen Reactions  . Coconut Fatty Acids Swelling    Throat swells     Current Outpatient Prescriptions:  .  albuterol (PROVENTIL) (5 MG/ML) 0.5% nebulizer solution, Take 0.5 mLs (2.5 mg total) by nebulization 2 (two) times daily., Disp: 20 mL, Rfl: 11 .  atorvastatin (LIPITOR) 20 MG tablet, Take 1 tablet by mouth at  bedtime, Disp: 90 tablet, Rfl: 3 .  budesonide-formoterol (SYMBICORT) 160-4.5 MCG/ACT inhaler, Inhale 2 puffs into the lungs 2 (two) times daily., Disp: 1 Inhaler, Rfl: 0 .  diazepam (VALIUM) 5 MG tablet, Take 1 tablet (5 mg total) by mouth every 6 (six) hours as needed for muscle spasms., Disp: 30 tablet, Rfl: 5 .  gabapentin (NEURONTIN) 600 MG tablet, Take 0.5 tablets (300 mg total) by mouth 2 (two) times daily., Disp: 60 tablet, Rfl: 2 .  glimepiride (AMARYL) 2 MG tablet, Take 1 tablet (2 mg total) by mouth 2 (two) times daily., Disp: 60 tablet, Rfl: 2 .  HYDROcodone-acetaminophen (NORCO) 5-325 MG  tablet, Take 1 tablet by mouth every 6 (six) hours as needed for moderate pain or severe pain., Disp: 20 tablet, Rfl: 0 .  isosorbide mononitrate (IMDUR) 30 MG 24 hr tablet, Take 1 tablet by mouth  daily, Disp: 90 tablet, Rfl: 3 .  levofloxacin (LEVAQUIN) 500 MG tablet, Take 1 tablet (500 mg total) by mouth daily., Disp: 5 tablet, Rfl: 0 .  magnesium oxide (MAG-OX) 400 (241.3 MG) MG tablet, Take 1 tablet by mouth 2 (two) times daily., Disp: , Rfl:  .  metoprolol tartrate (LOPRESSOR) 25 MG tablet, Take 1 tablet (25 mg total) by mouth 2 (two) times daily., Disp: 180 tablet, Rfl: 3 .  Multiple Vitamins-Minerals (ICAPS) CAPS, Take 2 capsules by mouth daily. Reported on 04/17/2015, Disp: , Rfl:  .  nitroGLYCERIN (NITROSTAT) 0.4 MG SL tablet, Place 1 tablet (0.4 mg total) under the tongue every 5 (five) minutes as needed for chest pain., Disp: 25 tablet, Rfl: 3 .  ondansetron (ZOFRAN) 8 MG tablet, Take 1 tablet (8 mg total) by mouth every 8 (eight) hours as needed for nausea or vomiting., Disp: 30 tablet, Rfl: 1 .  ONE TOUCH ULTRA TEST test strip, USE TO CHECK BLOOD SUGAR ONCE DAILY, Disp: 100 each, Rfl: 11 .  pantoprazole (PROTONIX) 40 MG tablet, Take 1 tablet by mouth  daily, Disp: 90 tablet, Rfl: 3 .  nystatin cream (MYCOSTATIN), Apply 1 application topically  2 (two) times daily., Disp: 30 g, Rfl: 0 .  triamcinolone cream (KENALOG) 0.1 %, Apply 1 application topically 2 (two) times daily., Disp: 30 g, Rfl: 0 No current facility-administered medications for this visit.   Facility-Administered Medications Ordered in Other Visits:  .  ondansetron (ZOFRAN) 8 mg, dexamethasone (DECADRON) 10 mg in sodium chloride 0.9 % 50 mL IVPB, , Intravenous, Once, Finnegan, Kathlene November, MD  Review of Systems  Constitutional: Negative for fatigue and fever.  HENT: Negative for congestion, rhinorrhea and sore throat.   Eyes: Negative for pain.  Respiratory: Negative for cough and shortness of breath.   Gastrointestinal:  Negative for anorexia, diarrhea and vomiting.  Musculoskeletal: Negative for joint pain.  Skin: Positive for rash. Negative for nail changes.    Social History  Substance Use Topics  . Smoking status: Former Smoker    Packs/day: 1.00    Years: 50.00    Types: Cigarettes    Quit date: 10/03/2015  . Smokeless tobacco: Never Used     Comment: smokes 3 cigs daily 05/06/15. Pt instructed to quit.  . Alcohol use No   Objective:   BP 122/64 (BP Location: Left Arm, Patient Position: Sitting, Cuff Size: Normal)   Pulse 68   Temp 98.4 F (36.9 C) (Oral)   Resp 16   Wt 152 lb (68.9 kg)   BMI 26.09 kg/m  Vitals:   10/08/16 1406  BP: 122/64  Pulse: 68  Resp: 16  Temp: 98.4 F (36.9 C)  TempSrc: Oral  Weight: 152 lb (68.9 kg)     Physical Exam  Constitutional: She is oriented to person, place, and time. She appears well-developed and well-nourished.  Lymphadenopathy:    She has no cervical adenopathy.  Neurological: She is alert and oriented to person, place, and time.  Skin: Skin is warm. Rash noted. There is erythema.  See photo of neck. Erythema with distinct border and scattered papules.  Psychiatric: She has a normal mood and affect. Her behavior is normal.  Vitals reviewed.   Media Information     Document Information   Photos  Neck rash   10/08/2016 2:24 PM  Attached To:  Office Visit on 10/08/16 with Trinna Post, PA-C  Source Information   Paulene Floor  [facility]       Assessment & Plan:     1. Skin rash  Treat as below for yeast, possible contact dermatitis. Please call   - nystatin cream (MYCOSTATIN); Apply 1 application topically 2 (two) times daily.  Dispense: 30 g; Refill: 0 - triamcinolone cream (KENALOG) 0.1 %; Apply 1 application topically 2 (two) times daily.  Dispense: 30 g; Refill: 0  The entirety of the information documented in the History of Present Illness, Review of Systems and Physical Exam were personally  obtained by me. Portions of this information were initially documented by Ashley Royalty, CMA and reviewed by me for thoroughness and accuracy.   Return if symptoms worsen or fail to improve.       Trinna Post, PA-C  [facility]

## 2016-10-08 NOTE — Patient Instructions (Signed)
Contact Dermatitis Dermatitis is redness, soreness, and swelling (inflammation) of the skin. Contact dermatitis is a reaction to certain substances that touch the skin. You either touched something that irritated your skin, or you have allergies to something you touched. Follow these instructions at home: Skin Care  Moisturize your skin as needed.  Apply cool compresses to the affected areas.  Try taking a bath with: ? Epsom salts. Follow the instructions on the package. You can get these at a pharmacy or grocery store. ? Baking soda. Pour a small amount into the bath as told by your doctor. ? Colloidal oatmeal. Follow the instructions on the package. You can get this at a pharmacy or grocery store.  Try applying baking soda paste to your skin. Stir water into baking soda until it looks like paste.  Do not scratch your skin.  Bathe less often.  Bathe in lukewarm water. Avoid using hot water. Medicines  Take or apply over-the-counter and prescription medicines only as told by your doctor.  If you were prescribed an antibiotic medicine, take or apply your antibiotic as told by your doctor. Do not stop taking the antibiotic even if your condition starts to get better. General instructions  Keep all follow-up visits as told by your doctor. This is important.  Avoid the substance that caused your reaction. If you do not know what caused it, keep a journal to try to track what caused it. Write down: ? What you eat. ? What cosmetic products you use. ? What you drink. ? What you wear in the affected area. This includes jewelry.  If you were given a bandage (dressing), take care of it as told by your doctor. This includes when to change and remove it. Contact a doctor if:  You do not get better with treatment.  Your condition gets worse.  You have signs of infection such as: ? Swelling. ? Tenderness. ? Redness. ? Soreness. ? Warmth.  You have a fever.  You have new  symptoms. Get help right away if:  You have a very bad headache.  You have neck pain.  Your neck is stiff.  You throw up (vomit).  You feel very sleepy.  You see red streaks coming from the affected area.  Your bone or joint underneath the affected area becomes painful after the skin has healed.  The affected area turns darker.  You have trouble breathing. This information is not intended to replace advice given to you by your health care provider. Make sure you discuss any questions you have with your health care provider. Document Released: 01/18/2009 Document Revised: 08/29/2015 Document Reviewed: 08/08/2014 Elsevier Interactive Patient Education  2018 Elsevier Inc.  

## 2016-10-23 DIAGNOSIS — J449 Chronic obstructive pulmonary disease, unspecified: Secondary | ICD-10-CM | POA: Diagnosis not present

## 2016-10-23 DIAGNOSIS — R0602 Shortness of breath: Secondary | ICD-10-CM | POA: Diagnosis not present

## 2016-10-25 DIAGNOSIS — J449 Chronic obstructive pulmonary disease, unspecified: Secondary | ICD-10-CM | POA: Diagnosis not present

## 2016-10-27 ENCOUNTER — Other Ambulatory Visit: Payer: Self-pay | Admitting: Family Medicine

## 2016-10-27 ENCOUNTER — Other Ambulatory Visit: Payer: Self-pay | Admitting: Cardiovascular Disease

## 2016-10-28 ENCOUNTER — Inpatient Hospital Stay: Payer: Medicare Other | Attending: Oncology

## 2016-10-28 DIAGNOSIS — Z85118 Personal history of other malignant neoplasm of bronchus and lung: Secondary | ICD-10-CM | POA: Diagnosis not present

## 2016-10-28 DIAGNOSIS — Z95828 Presence of other vascular implants and grafts: Secondary | ICD-10-CM

## 2016-10-28 MED ORDER — SODIUM CHLORIDE 0.9% FLUSH
10.0000 mL | INTRAVENOUS | Status: DC | PRN
  Administered 2016-10-28: 10 mL via INTRAVENOUS
  Filled 2016-10-28: qty 10

## 2016-10-28 MED ORDER — HEPARIN SOD (PORK) LOCK FLUSH 100 UNIT/ML IV SOLN
500.0000 [IU] | Freq: Once | INTRAVENOUS | Status: AC
  Administered 2016-10-28: 500 [IU] via INTRAVENOUS

## 2016-11-01 NOTE — Progress Notes (Signed)
Cardiology Office Note  Date:  11/02/2016   ID:  TANAI BOULER, DOB [birth year], MRN [id number]  PCP:  Jerrol Banana., MD   Chief Complaint  Patient presents with  . other    6 month follow up. Meds reviewed by the pt. verbally. "doing well."     HPI:  Ms. Sorci is a 76 year old woman with a history of   COPD with ongoing tobacco use,  stroke,  hypertension,  diabetes, Prior history of poor control diabetes CVA   Hyperlipidemia H/o chest pain  minimal nonobstructive coronary artery disease by catheterization on May 08, 2004, repeat catheterization September 2012 showing 50% LAD disease, 60% RCA disease,  cardiac catheterization May 2015 showing no significant CAD  who presents for follow-up of her shortness of breath, chest pain history  in the hospital October 2016 with sugars of 600  In follow-up today she reports that she is doing well Sugars are doing better, was 100 this morning Trying to watch her diet Chest pain, rare episodes  Once a week , Sometimes a sharp pain , goes quickly  Takes NTG At times Has not appreciated any escalation in her symptoms  Scheduled for CT scan, follow-up of her lung cancer Finished treatments early 2018   Lab work reviewed with her in detail  HBA1C 7.4 Total chol 186, LDL 86 From 2017  EKG personally reviewed by myself on todays visit  shows normal sinus rhythm with rate 67 bpm no significant ST or T-wave changes   Other past medical history reviewed  Lung CA, cancer treatment with complications Chemo x 3, In hospital after last round 9 days, platelets, PRBC, neulasta Going through XRT, on chest and brain Finishes 05/15/2106 Tolerating radiation well, so far Weight up and down Quit smoking since June 2017 Off asa, while she was in the hospital, has not restarted Continued chronic shortness of breath on exertion  Previously with leg swelling in the setting of anemia  HCT 23 in 02/2016 CR 1.7, BUN 37 Glu 157 she  reports sugar levels are improved.  HBA1C 08/2014 was 7.9  History of normal echocardiogram and stress test Hemoglobin A1c January 2017 was 10.4  Carotid ultrasound reviewed from July 2016 with her showing 50-69% stenosis on the right, greater than 70% disease on the left. Since then she has had carotid endarterectomy on the left  Prior catheterization in 2015 showed no significant CAD. There is no evidence of previously seen moderate disease in the LAD or RCA. left arm symptoms were from musculoskeletal etiology, unable to exclude arthritis in her neck and DJD.  She has a long history of chronic shortness of breath likely secondary to COPD She continues to have problems with diabetes control. No lightheadedness, PND or orthopnea. ACE inhibitor held by pulmonary for possible cough.   previous Total cholesterol 210, LDL 130, HDL 43  PMH:   has a past medical history of Abnormal CT lung screening (10/17/2015); COPD (chronic obstructive pulmonary disease) ([place]); Coronary artery disease, non-occlusive; Diabetes mellitus; Hyperlipemia; Hypertension; Lung cancer ([place]); Macular degeneration; Personal history of tobacco use, presenting hazards to health (10/15/2015); Pneumonia; Shortness of breath; and Stroke ([place]).  PSH:    Past Surgical History:  Procedure Laterality Date  . ABDOMINAL HYSTERECTOMY    . ANTERIOR CERVICAL DECOMP/DISCECTOMY FUSION N/A 10/19/2013   Procedure: CERVICAL FIVE-SIX ANTERIOR CERVICAL DECOMPRESSION WITH FUSION INTERBODY PROSTHESIS PLATING AND PEEK CAGE;  Surgeon: Ophelia Charter, MD;  Location: [facility];  Service: Neurosurgery;  Laterality:  N/A;  . BACK SURGERY  80's  . BREAST CYST EXCISION Left    left negative   . CARDIAC CATHETERIZATION  05/2004  . CATARACT EXTRACTION Left   . CHOLECYSTECTOMY    . ENDARTERECTOMY Left 10/24/2014   Procedure: ENDARTERECTOMY CAROTID;  Surgeon: Algernon Huxley, MD;  Location: [facility];  Service: Vascular;  Laterality: Left;  .  ENDOBRONCHIAL ULTRASOUND N/A 11/14/2015   Procedure: ENDOBRONCHIAL ULTRASOUND;  Surgeon: [name], MD;  Location: [facility];  Service: Cardiopulmonary;  Laterality: N/A;  . PERIPHERAL VASCULAR CATHETERIZATION N/A 12/04/2015   Procedure: Glori Luis Cath Insertion;  Surgeon: Algernon Huxley, MD;  Location: [facility];  Service: Cardiovascular;  Laterality: N/A;  . TONSILLECTOMY AND ADENOIDECTOMY    . VESICOVAGINAL FISTULA CLOSURE W/ TAH      Current Outpatient Prescriptions  Medication Sig Dispense Refill  . albuterol (PROVENTIL) (5 MG/ML) 0.5% nebulizer solution Take 0.5 mLs (2.5 mg total) by nebulization 2 (two) times daily. 20 mL 11  . atorvastatin (LIPITOR) 40 MG tablet Take 0.5 tablets (20 mg total) by mouth at bedtime. 90 tablet 3  . budesonide-formoterol (SYMBICORT) 160-4.5 MCG/ACT inhaler Inhale 2 puffs into the lungs 2 (two) times daily. 1 Inhaler 0  . diazepam (VALIUM) 5 MG tablet Take 1 tablet (5 mg total) by mouth every 6 (six) hours as needed for muscle spasms. 30 tablet 5  . gabapentin (NEURONTIN) 600 MG tablet Take 0.5 tablets (300 mg total) by mouth 2 (two) times daily. 60 tablet 2  . glimepiride (AMARYL) 2 MG tablet Take 1 tablet (2 mg total) by mouth 2 (two) times daily. 60 tablet 2  . HYDROcodone-acetaminophen (NORCO) 5-325 MG tablet Take 1 tablet by mouth every 6 (six) hours as needed for moderate pain or severe pain. 20 tablet 0  . isosorbide mononitrate (IMDUR) 30 MG 24 hr tablet TAKE 1 TABLET BY MOUTH  DAILY 90 tablet 0  . magnesium oxide (MAG-OX) 400 (241.3 MG) MG tablet Take 1 tablet by mouth 2 (two) times daily.    . metoprolol tartrate (LOPRESSOR) 25 MG tablet Take 1 tablet (25 mg total) by mouth 2 (two) times daily. 180 tablet 3  . Multiple Vitamins-Minerals (ICAPS) CAPS Take 2 capsules by mouth daily. Reported on 04/17/2015    . nitroGLYCERIN (NITROSTAT) 0.4 MG SL tablet Place 1 tablet (0.4 mg total) under the tongue every 5 (five) minutes as needed for chest pain. 25  tablet 3  . nystatin cream (MYCOSTATIN) Apply 1 application topically 2 (two) times daily. 30 g 0  . ondansetron (ZOFRAN) 8 MG tablet Take 1 tablet (8 mg total) by mouth every 8 (eight) hours as needed for nausea or vomiting. 30 tablet 1  . ONE TOUCH ULTRA TEST test strip USE TO CHECK BLOOD SUGAR ONCE DAILY 100 each 11  . pantoprazole (PROTONIX) 40 MG tablet TAKE 1 TABLET BY MOUTH  DAILY 90 tablet 3  . triamcinolone cream (KENALOG) 0.1 % Apply 1 application topically 2 (two) times daily. 30 g 0   No current facility-administered medications for this visit.    Facility-Administered Medications Ordered in Other Visits  Medication Dose Route Frequency Provider Last Rate Last Dose  . ondansetron (ZOFRAN) 8 mg, dexamethasone (DECADRON) 10 mg in sodium chloride 0.9 % 50 mL IVPB   Intravenous Once [name], Kathlene November, MD         Allergies:   Coconut fatty acids   Social History:  The patient  reports that she quit smoking about 13  months ago. Her smoking use included Cigarettes. She has a 50.00 pack-year smoking history. She has never used smokeless tobacco. She reports that she does not drink alcohol or use drugs.   Family History:   family history includes Heart attack in her brother, brother, father, and paternal grandmother; Lung cancer in her maternal grandfather; Stroke in her mother.    Review of Systems: Review of Systems  Constitutional: Negative.   Respiratory: Positive for shortness of breath.   Cardiovascular: Positive for chest pain.  Gastrointestinal: Negative.   Musculoskeletal: Negative.   Neurological: Negative.   Psychiatric/Behavioral: Negative.   All other systems reviewed and are negative.    PHYSICAL EXAM: VS:  BP 124/62 (BP Location: Left Arm, Patient Position: Sitting, Cuff Size: Normal)   Pulse 67   Ht 5\' 2"  (1.575 m)   Wt 151 lb 12 oz (68.8 kg)   BMI 27.76 kg/m  , BMI Body mass index is 27.76 kg/m. GEN: Well nourished, well developed, in no acute distress   HEENT: normal  Neck: no JVD, carotid bruits, or masses Cardiac: RRR; no murmurs, rubs, or gallops,no edema  Respiratory:  clear to auscultation bilaterally, normal work of breathing GI: soft, nontender, nondistended, + BS MS: no deformity or atrophy  Skin: warm and dry, no rash Neuro:  Strength and sensation are intact Psych: euthymic mood, full affect    Recent Labs: 02/13/2016: Magnesium 1.8 03/02/2016: ALT 11 07/14/2016: Hemoglobin 9.9; Platelets 234; TSH 3.136 07/30/2016: BUN 22; Creatinine, Ser 1.80; Potassium 5.3; Sodium 140    Lipid Panel Lab Results  Component Value Date   CHOL 186 08/19/2015   HDL 55 08/19/2015   LDLCALC 86 08/19/2015   TRIG 226 (H) 08/19/2015      Wt Readings from Last 3 Encounters:  11/02/16 151 lb 12 oz (68.8 kg)  10/08/16 152 lb (68.9 kg)  09/22/16 153 lb (69.4 kg)       ASSESSMENT AND PLAN:  Mixed hyperlipidemia LDL above goal, recommended she increase Lipitor up to 40 mg daily   she'll continue to work on her diabetes  HYPERTENSION, BENIGN - Plan: EKG 12-Lead Blood pressure is well controlled on today's visit. No changes made to the medications.  stable angina ([place]) - Plan: EKG 12-Lead Stable anginal symptoms, takes nitroglycerin when necessary   TOBACCO ABUSE Stop smoking June 2017  Centrilobular emphysema (H[facility]) Stable symptoms, on inhalers. Stop smoking 2017 Lung cancer, completed treatment   surveillance CT scans scheduled  Diabetic polyneuropathy associated with other specified diabetes mellitus ([place]) Working on her diabetes, hemoglobin A1c improved down to 7.4   Primary cancer of right upper lobe of lung ([place]) Long discussion concerning recent events, recovered relatively well from treatments earlier in the year   Type 2 diabetes mellitus with other circulatory complication, unspecified long term insulin use status ([place]) We have encouraged continued exercise, careful diet management in an effort to lose weight.    Total encounter time more than 25 minutes  Greater than 50% was spent in counseling and coordination of care with the patient   Disposition:   F/U  12 months   Orders Placed This Encounter  Procedures  . EKG 12-Lead     Signed, Esmond Plants, M.D., Ph.D. 11/02/2016  [place], [place]

## 2016-11-02 ENCOUNTER — Ambulatory Visit (INDEPENDENT_AMBULATORY_CARE_PROVIDER_SITE_OTHER): Payer: Medicare Other | Admitting: Cardiovascular Disease

## 2016-11-02 ENCOUNTER — Ambulatory Visit: Payer: Self-pay | Admitting: Radiation Oncology

## 2016-11-02 ENCOUNTER — Encounter: Payer: Self-pay | Admitting: Cardiovascular Disease

## 2016-11-02 VITALS — BP 124/62 | HR 67 | Ht 62.0 in | Wt 151.8 lb

## 2016-11-02 DIAGNOSIS — I10 Essential (primary) hypertension: Secondary | ICD-10-CM

## 2016-11-02 DIAGNOSIS — E1342 Other specified diabetes mellitus with diabetic polyneuropathy: Secondary | ICD-10-CM | POA: Diagnosis not present

## 2016-11-02 DIAGNOSIS — I25118 Atherosclerotic heart disease of native coronary artery with other forms of angina pectoris: Secondary | ICD-10-CM | POA: Diagnosis not present

## 2016-11-02 DIAGNOSIS — J432 Centrilobular emphysema: Secondary | ICD-10-CM

## 2016-11-02 DIAGNOSIS — E782 Mixed hyperlipidemia: Secondary | ICD-10-CM

## 2016-11-02 DIAGNOSIS — C3411 Malignant neoplasm of upper lobe, right bronchus or lung: Secondary | ICD-10-CM

## 2016-11-02 DIAGNOSIS — F17200 Nicotine dependence, unspecified, uncomplicated: Secondary | ICD-10-CM

## 2016-11-02 MED ORDER — ATORVASTATIN CALCIUM 40 MG PO TABS: 20.0000 mg | ORAL_TABLET | Freq: Every day | ORAL | 3 refills | Status: DC

## 2016-11-02 NOTE — Patient Instructions (Addendum)
Medication Instructions:   Please increase the lipitor up to 40 mg daily  Labwork:  No new labs needed  Testing/Procedures:  No further testing at this time   Follow-Up: It was a pleasure seeing you in the office today. Please call us if you have new issues that need to be addressed before your next appt.  [phone]  Your physician wants you to follow-up in: 12 months.  You will receive a reminder letter in the mail two months in advance. If you don't receive a letter, please call our office to schedule the follow-up appointment.  If you need a refill on your cardiac medications before your next appointment, please call your pharmacy.

## 2016-11-19 ENCOUNTER — Telehealth: Payer: Self-pay | Admitting: Family Medicine

## 2016-11-19 DIAGNOSIS — Z1211 Encounter for screening for malignant neoplasm of colon: Secondary | ICD-10-CM

## 2016-11-19 NOTE — Telephone Encounter (Signed)
cologard ok

## 2016-11-19 NOTE — Telephone Encounter (Signed)
Cologuard ordered and form faxed. Patient advised.

## 2016-11-19 NOTE — Telephone Encounter (Signed)
Pt called saying insurance has contacted her about getting a colonoscopy.  She want to know if she can have a kit sent to her home.  Her call back is [phone]

## 2016-11-23 DIAGNOSIS — J449 Chronic obstructive pulmonary disease, unspecified: Secondary | ICD-10-CM | POA: Diagnosis not present

## 2016-11-23 DIAGNOSIS — R0602 Shortness of breath: Secondary | ICD-10-CM | POA: Diagnosis not present

## 2016-11-25 DIAGNOSIS — J449 Chronic obstructive pulmonary disease, unspecified: Secondary | ICD-10-CM | POA: Diagnosis not present

## 2016-12-09 ENCOUNTER — Inpatient Hospital Stay: Payer: Medicare Other | Attending: Oncology

## 2016-12-09 ENCOUNTER — Ambulatory Visit
Admission: RE | Admit: 2016-12-09 | Discharge: 2016-12-09 | Disposition: A | Discharge status: Home / Self Care | Payer: Medicare Other | Source: Ambulatory Visit | Attending: Radiation Oncology | Admitting: Radiation Oncology

## 2016-12-09 ENCOUNTER — Encounter: Payer: Self-pay | Admitting: Radiation Oncology

## 2016-12-09 VITALS — BP 116/55 | HR 71 | Temp 96.7°F | Wt 149.9 lb

## 2016-12-09 DIAGNOSIS — Z923 Personal history of irradiation: Secondary | ICD-10-CM | POA: Insufficient documentation

## 2016-12-09 DIAGNOSIS — Z85118 Personal history of other malignant neoplasm of bronchus and lung: Secondary | ICD-10-CM | POA: Insufficient documentation

## 2016-12-09 DIAGNOSIS — Z87891 Personal history of nicotine dependence: Secondary | ICD-10-CM | POA: Insufficient documentation

## 2016-12-09 DIAGNOSIS — Z8673 Personal history of transient ischemic attack (TIA), and cerebral infarction without residual deficits: Secondary | ICD-10-CM | POA: Insufficient documentation

## 2016-12-09 DIAGNOSIS — Z7984 Long term (current) use of oral hypoglycemic drugs: Secondary | ICD-10-CM | POA: Diagnosis not present

## 2016-12-09 DIAGNOSIS — N289 Disorder of kidney and ureter, unspecified: Secondary | ICD-10-CM | POA: Diagnosis not present

## 2016-12-09 DIAGNOSIS — I2510 Atherosclerotic heart disease of native coronary artery without angina pectoris: Secondary | ICD-10-CM | POA: Insufficient documentation

## 2016-12-09 DIAGNOSIS — Z9049 Acquired absence of other specified parts of digestive tract: Secondary | ICD-10-CM | POA: Diagnosis not present

## 2016-12-09 DIAGNOSIS — D3501 Benign neoplasm of right adrenal gland: Secondary | ICD-10-CM | POA: Diagnosis not present

## 2016-12-09 DIAGNOSIS — Z8701 Personal history of pneumonia (recurrent): Secondary | ICD-10-CM | POA: Diagnosis not present

## 2016-12-09 DIAGNOSIS — Z801 Family history of malignant neoplasm of trachea, bronchus and lung: Secondary | ICD-10-CM | POA: Diagnosis not present

## 2016-12-09 DIAGNOSIS — I700 Atherosclerosis of aorta: Secondary | ICD-10-CM | POA: Insufficient documentation

## 2016-12-09 DIAGNOSIS — C3411 Malignant neoplasm of upper lobe, right bronchus or lung: Secondary | ICD-10-CM

## 2016-12-09 DIAGNOSIS — D649 Anemia, unspecified: Secondary | ICD-10-CM | POA: Diagnosis not present

## 2016-12-09 DIAGNOSIS — I10 Essential (primary) hypertension: Secondary | ICD-10-CM | POA: Insufficient documentation

## 2016-12-09 DIAGNOSIS — J449 Chronic obstructive pulmonary disease, unspecified: Secondary | ICD-10-CM | POA: Diagnosis not present

## 2016-12-09 DIAGNOSIS — E1165 Type 2 diabetes mellitus with hyperglycemia: Secondary | ICD-10-CM | POA: Diagnosis not present

## 2016-12-09 DIAGNOSIS — E785 Hyperlipidemia, unspecified: Secondary | ICD-10-CM | POA: Diagnosis not present

## 2016-12-09 DIAGNOSIS — Z95828 Presence of other vascular implants and grafts: Secondary | ICD-10-CM

## 2016-12-09 DIAGNOSIS — Z9221 Personal history of antineoplastic chemotherapy: Secondary | ICD-10-CM | POA: Diagnosis not present

## 2016-12-09 DIAGNOSIS — Z9181 History of falling: Secondary | ICD-10-CM | POA: Diagnosis not present

## 2016-12-09 MED ORDER — SODIUM CHLORIDE 0.9% FLUSH
10.0000 mL | INTRAVENOUS | Status: DC | PRN
  Administered 2016-12-09: 10 mL via INTRAVENOUS
  Filled 2016-12-09: qty 10

## 2016-12-09 MED ORDER — HEPARIN SOD (PORK) LOCK FLUSH 100 UNIT/ML IV SOLN
500.0000 [IU] | Freq: Once | INTRAVENOUS | Status: AC
  Administered 2016-12-09: 500 [IU] via INTRAVENOUS

## 2016-12-09 NOTE — Progress Notes (Signed)
Radiation Oncology Follow up Note  Name: [name] [name]   Date:   12/09/2016 MRN:  [id number] DOB: Sep 13, 1940    This 76 y.o. female presents to the clinic today for  Follow-up for stage IIIa limited still stage small cell lung cancer status post concurrent chemoradiation to her chest as well as PCI  REFERRING PROVIDER: Jerrol Banana.,*  HPI: Patient is a 76 year old female now out 11 months having completed concurrent chemoradiation to her lung for stage IIIB limited stage small cell lung cancer. She's also 6 months out for PCI. She is doing well. She specifically denies cough hemoptysis or chest tightness.. Her last CT scan was back in June showing no findings to suggest residual or recurrent disease. She's having no change in neurologic status.  COMPLICATIONS OF TREATMENT: none  FOLLOW UP COMPLIANCE: keeps appointments   PHYSICAL EXAM:  BP (!) 116/55   Pulse 71   Temp (!) 96.7 F (35.9 C)   Wt 149 lb 14.6 oz (68 kg)   BMI 27.42 kg/m  Well-developed well-nourished patient in NAD. HEENT reveals PERLA, EOMI, discs not visualized.  Oral cavity is clear. No oral mucosal lesions are identified. Neck is clear without evidence of cervical or supraclavicular adenopathy. Lungs are clear to A&P. Cardiac examination is essentially unremarkable with regular rate and rhythm without murmur rub or thrill. Abdomen is benign with no organomegaly or masses noted. Motor sensory and DTR levels are equal and symmetric in the upper and lower extremities. Cranial nerves II through XII are grossly intact. Proprioception is intact. No peripheral adenopathy or edema is identified. No motor or sensory levels are noted. Crude visual fields are within normal range.  RADIOLOGY RESULTS: Previous CT scans back in June are reviewed she is scheduled for another CT scan this month which I will review when available  PLAN: Present time patient continues to do well with no evidence of disease. I'm please were  overall progress. I've asked to see her back in 6 months for follow-up and then will go to once your follow-up visits. Patient knows to call with any concerns.  I would like to take this opportunity to thank you for allowing me to participate in the care of your patient.Armstead Peaks., MD

## 2016-12-15 ENCOUNTER — Other Ambulatory Visit: Payer: Self-pay | Admitting: Cardiovascular Disease

## 2016-12-22 DIAGNOSIS — N183 Chronic kidney disease, stage 3 (moderate): Secondary | ICD-10-CM | POA: Diagnosis not present

## 2016-12-22 DIAGNOSIS — I10 Essential (primary) hypertension: Secondary | ICD-10-CM | POA: Diagnosis not present

## 2016-12-22 LAB — BASIC METABOLIC PANEL
BUN: 19 (ref 4–21)
CREATININE: 1.5 — AB (ref 0.5–1.1)
Glucose: 231
POTASSIUM: 4.9 (ref 3.4–5.3)
SODIUM: 140 (ref 137–147)

## 2016-12-24 DIAGNOSIS — R0602 Shortness of breath: Secondary | ICD-10-CM | POA: Diagnosis not present

## 2016-12-24 DIAGNOSIS — J449 Chronic obstructive pulmonary disease, unspecified: Secondary | ICD-10-CM | POA: Diagnosis not present

## 2016-12-25 ENCOUNTER — Encounter: Payer: Self-pay | Admitting: Nephrology

## 2016-12-25 ENCOUNTER — Inpatient Hospital Stay: Payer: Medicare Other

## 2016-12-25 ENCOUNTER — Ambulatory Visit
Admission: RE | Admit: 2016-12-25 | Discharge: 2016-12-25 | Disposition: A | Discharge status: Home / Self Care | Payer: Medicare Other | Source: Ambulatory Visit | Attending: Oncology | Admitting: Oncology

## 2016-12-25 DIAGNOSIS — J449 Chronic obstructive pulmonary disease, unspecified: Secondary | ICD-10-CM | POA: Diagnosis not present

## 2016-12-25 DIAGNOSIS — Z87891 Personal history of nicotine dependence: Secondary | ICD-10-CM | POA: Diagnosis not present

## 2016-12-25 DIAGNOSIS — C3411 Malignant neoplasm of upper lobe, right bronchus or lung: Secondary | ICD-10-CM | POA: Insufficient documentation

## 2016-12-25 DIAGNOSIS — Z9049 Acquired absence of other specified parts of digestive tract: Secondary | ICD-10-CM | POA: Diagnosis not present

## 2016-12-25 DIAGNOSIS — D3501 Benign neoplasm of right adrenal gland: Secondary | ICD-10-CM | POA: Diagnosis not present

## 2016-12-25 DIAGNOSIS — Z7984 Long term (current) use of oral hypoglycemic drugs: Secondary | ICD-10-CM | POA: Diagnosis not present

## 2016-12-25 DIAGNOSIS — Z85118 Personal history of other malignant neoplasm of bronchus and lung: Secondary | ICD-10-CM | POA: Diagnosis not present

## 2016-12-25 DIAGNOSIS — I700 Atherosclerosis of aorta: Secondary | ICD-10-CM | POA: Diagnosis not present

## 2016-12-25 DIAGNOSIS — N289 Disorder of kidney and ureter, unspecified: Secondary | ICD-10-CM | POA: Diagnosis not present

## 2016-12-25 DIAGNOSIS — Z9181 History of falling: Secondary | ICD-10-CM | POA: Diagnosis not present

## 2016-12-25 DIAGNOSIS — D649 Anemia, unspecified: Secondary | ICD-10-CM | POA: Diagnosis not present

## 2016-12-25 DIAGNOSIS — E1165 Type 2 diabetes mellitus with hyperglycemia: Secondary | ICD-10-CM | POA: Diagnosis not present

## 2016-12-25 DIAGNOSIS — I2510 Atherosclerotic heart disease of native coronary artery without angina pectoris: Secondary | ICD-10-CM | POA: Insufficient documentation

## 2016-12-25 DIAGNOSIS — Z8673 Personal history of transient ischemic attack (TIA), and cerebral infarction without residual deficits: Secondary | ICD-10-CM | POA: Diagnosis not present

## 2016-12-25 DIAGNOSIS — I10 Essential (primary) hypertension: Secondary | ICD-10-CM | POA: Diagnosis not present

## 2016-12-25 DIAGNOSIS — J439 Emphysema, unspecified: Secondary | ICD-10-CM | POA: Insufficient documentation

## 2016-12-25 DIAGNOSIS — Z801 Family history of malignant neoplasm of trachea, bronchus and lung: Secondary | ICD-10-CM | POA: Diagnosis not present

## 2016-12-25 DIAGNOSIS — E785 Hyperlipidemia, unspecified: Secondary | ICD-10-CM | POA: Diagnosis not present

## 2016-12-25 LAB — CBC
HEMATOCRIT: 29.2 % — AB (ref 35.0–47.0)
Hemoglobin: 10.1 g/dL — ABNORMAL LOW (ref 12.0–16.0)
MCH: 32.1 pg (ref 26.0–34.0)
MCHC: 34.5 g/dL (ref 32.0–36.0)
MCV: 93.1 fL (ref 80.0–100.0)
Platelets: 219 10*3/uL (ref 150–440)
RBC: 3.13 MIL/uL — AB (ref 3.80–5.20)
RDW: 14.7 % — ABNORMAL HIGH (ref 11.5–14.5)
WBC: 6.8 10*3/uL (ref 3.6–11.0)

## 2016-12-26 DIAGNOSIS — J449 Chronic obstructive pulmonary disease, unspecified: Secondary | ICD-10-CM | POA: Diagnosis not present

## 2016-12-27 NOTE — Progress Notes (Signed)
[facility]  [phone] [phone]) [phone]  ID: NAHOMY LIMBURG OB: 1940/11/19  MR#: [id number]  YIF#:[id number]  Patient Care Team: Jerrol Banana., MD as PCP - General (Unknown Physician Specialty) [name], MD as Consulting Physician (Cardiology) [name], MD as Consulting Physician (Vascular Surgery)  CHIEF COMPLAINT: Clinical stage IIIa small cell lung carcinoma of the right upper lobe lung.  INTERVAL HISTORY: Patient returns to clinic today for further evaluation and discussion of her imaging results. She currently feels well and is asymptomatic. She reports a several falls secondary to tripping over her dog. She has no neurologic complaints. She denies any fevers. She denies any chest pain, shortness of breath, cough, or hemoptysis. She has a good appetite and denies weight loss. She denies any nausea, vomiting, constipation, or diarrhea. She has no urinary complaints. Patient offers no specific complaints today.  REVIEW OF SYSTEMS:   Review of Systems  Constitutional: Negative for fever, malaise/fatigue and weight loss.  Respiratory: Negative for cough, hemoptysis and shortness of breath.   Cardiovascular: Negative.  Negative for chest pain and leg swelling.  Gastrointestinal: Negative for abdominal pain, melena, nausea and vomiting.  Genitourinary: Negative.   Musculoskeletal: Negative.   Skin: Negative.  Negative for rash.  Neurological: Negative for sensory change and weakness.  Psychiatric/Behavioral: Negative.  The patient is not nervous/anxious.     As per HPI. Otherwise, a complete review of systems is negative.  PAST MEDICAL HISTORY: Past Medical History:  Diagnosis Date  . Abnormal CT lung screening 10/17/2015  . COPD (chronic obstructive pulmonary disease) ([place])   . Coronary artery disease, non-occlusive    a. cath 2006: min nonobs CAD; b. cath 12/2010: cath LAD 50%, RCA 60%; c. 08/2013: Minimal luminal irregs,  right dominant system with no significant CAD, diffuse luminal irregs noted. Normal EF 55%, no AS or MS.   . Diabetes mellitus   . Hyperlipemia    Followed by Dr. Rosanna Randy  . Hypertension   . Lung cancer ([place])   . Macular degeneration    rt  . Personal history of tobacco use, presenting hazards to health 10/15/2015  . Pneumonia    hx  . Shortness of breath   . Stroke [facility])     PAST SURGICAL HISTORY: Past Surgical History:  Procedure Laterality Date  . ABDOMINAL HYSTERECTOMY    . ANTERIOR CERVICAL DECOMP/DISCECTOMY FUSION N/A 10/19/2013   Procedure: CERVICAL FIVE-SIX ANTERIOR CERVICAL DECOMPRESSION WITH FUSION INTERBODY PROSTHESIS PLATING AND PEEK CAGE;  Surgeon: Ophelia Charter, MD;  Location: [facility];  Service: Neurosurgery;  Laterality: N/A;  . BACK SURGERY  80's  . BREAST CYST EXCISION Left    left negative   . CARDIAC CATHETERIZATION  05/2004  . CATARACT EXTRACTION Left   . CHOLECYSTECTOMY    . ENDARTERECTOMY Left 10/24/2014   Procedure: ENDARTERECTOMY CAROTID;  Surgeon: Algernon Huxley, MD;  Location: [facility] ORS;  Service: Vascular;  Laterality: Left;  . ENDOBRONCHIAL ULTRASOUND N/A 11/14/2015   Procedure: ENDOBRONCHIAL ULTRASOUND;  Surgeon: [name], MD;  Location: [facility] ORS;  Service: Cardiopulmonary;  Laterality: N/A;  . PERIPHERAL VASCULAR CATHETERIZATION N/A 12/04/2015   Procedure: Glori Luis Cath Insertion;  Surgeon: Algernon Huxley, MD;  Location: [facility];  Service: Cardiovascular;  Laterality: N/A;  . TONSILLECTOMY AND ADENOIDECTOMY    . VESICOVAGINAL FISTULA CLOSURE W/ TAH      FAMILY HISTORY: Family History  Problem Relation Age of Onset  . Stroke Mother  Massive  . Heart attack Father        Massive  . Heart attack Brother   . Heart attack Brother   . Lung cancer Maternal Grandfather   . Heart attack Paternal Grandmother        MI       ADVANCED DIRECTIVES (Y/N):  N   HEALTH MAINTENANCE: Social History  Substance Use Topics  . Smoking  status: Former Smoker    Packs/day: 1.00    Years: 50.00    Types: Cigarettes    Quit date: 10/03/2015  . Smokeless tobacco: Never Used     Comment: smokes 3 cigs daily 05/06/15. Pt instructed to quit.  . Alcohol use No     Colonoscopy:  PAP:  Bone density:  Lipid panel:  Allergies  Allergen Reactions  . Coconut Fatty Acids Swelling    Throat swells    Current Outpatient Prescriptions  Medication Sig Dispense Refill  . albuterol (PROVENTIL) (5 MG/ML) 0.5% nebulizer solution Take 0.5 mLs (2.5 mg total) by nebulization 2 (two) times daily. 20 mL 11  . atorvastatin (LIPITOR) 40 MG tablet Take 0.5 tablets (20 mg total) by mouth at bedtime. 90 tablet 3  . budesonide-formoterol (SYMBICORT) 160-4.5 MCG/ACT inhaler Inhale 2 puffs into the lungs 2 (two) times daily. 1 Inhaler 0  . gabapentin (NEURONTIN) 600 MG tablet Take 0.5 tablets (300 mg total) by mouth 2 (two) times daily. 60 tablet 2  . glimepiride (AMARYL) 2 MG tablet Take 1 tablet (2 mg total) by mouth 2 (two) times daily. 60 tablet 2  . HYDROcodone-acetaminophen (NORCO) 5-325 MG tablet Take 1 tablet by mouth every 6 (six) hours as needed for moderate pain or severe pain. 20 tablet 0  . isosorbide mononitrate (IMDUR) 30 MG 24 hr tablet TAKE 1 TABLET BY MOUTH  DAILY 90 tablet 0  . magnesium oxide (MAG-OX) 400 (241.3 MG) MG tablet Take 1 tablet by mouth 2 (two) times daily.    . metoprolol tartrate (LOPRESSOR) 25 MG tablet Take 1 tablet (25 mg total) by mouth 2 (two) times daily. 180 tablet 3  . Multiple Vitamins-Minerals (ICAPS) CAPS Take 2 capsules by mouth daily. Reported on 04/17/2015    . nitroGLYCERIN (NITROSTAT) 0.4 MG SL tablet Place 1 tablet (0.4 mg total) under the tongue every 5 (five) minutes as needed for chest pain. 25 tablet 3  . nystatin cream (MYCOSTATIN) Apply 1 application topically 2 (two) times daily. 30 g 0  . ondansetron (ZOFRAN) 8 MG tablet Take 1 tablet (8 mg total) by mouth every 8 (eight) hours as needed for  nausea or vomiting. 30 tablet 1  . ONE TOUCH ULTRA TEST test strip USE TO CHECK BLOOD SUGAR ONCE DAILY 100 each 11  . pantoprazole (PROTONIX) 40 MG tablet TAKE 1 TABLET BY MOUTH  DAILY 90 tablet 3  . triamcinolone cream (KENALOG) 0.1 % Apply 1 application topically 2 (two) times daily. 30 g 0  . diazepam (VALIUM) 5 MG tablet Take 1 tablet (5 mg total) by mouth every 12 (twelve) hours as needed for muscle spasms. 30 tablet 5  . mometasone (ELOCON) 0.1 % cream Apply small amount nightly to each ear 45 g 0   No current facility-administered medications for this visit.    Facility-Administered Medications Ordered in Other Visits  Medication Dose Route Frequency Provider Last Rate Last Dose  . ondansetron (ZOFRAN) 8 mg, dexamethasone (DECADRON) 10 mg in sodium chloride 0.9 % 50 mL IVPB   Intravenous Once Delight Hoh  J, MD        OBJECTIVE: Vitals:   12/28/16 1353  BP: 130/67  Pulse: 70  Resp: 18  Temp: (!) 96.7 F (35.9 C)     Body mass index is 27.11 kg/m.    ECOG FS:1 - Symptomatic but completely ambulatory  General: Well-developed, well-nourished, no acute distress. Eyes: Pink conjunctiva, anicteric sclera. Lungs: Clear to auscultation bilaterally. Heart: Regular rate and rhythm. No rubs, murmurs, or gallops. Abdomen: Soft, nontender, nondistended. No organomegaly noted, normoactive bowel sounds. Musculoskeletal: No edema, cyanosis, or clubbing. Neuro: Alert, answering all questions appropriately. Cranial nerves grossly intact. Skin: No rashes or petechiae noted. Psych: Normal affect.   LAB RESULTS:  Lab Results  Component Value Date   NA 140 12/22/2016   K 4.9 12/22/2016   CL 98 07/30/2016   CO2 26 07/30/2016   GLUCOSE 131 (H) 07/30/2016   BUN 19 12/22/2016   CREATININE 1.5 (A) 12/22/2016   CALCIUM 9.6 07/30/2016   PROT 7.3 03/02/2016   ALBUMIN 4.1 07/30/2016   AST 13 (L) 03/02/2016   ALT 11 (L) 03/02/2016   ALKPHOS 67 03/02/2016   BILITOT 0.4 03/02/2016    GFRNONAA 27 (L) 07/30/2016   GFRAA 32 (L) 07/30/2016    Lab Results  Component Value Date   WBC 6.8 12/25/2016   NEUTROABS 4.4 07/14/2016   HGB 10.1 (L) 12/25/2016   HCT 29.2 (L) 12/25/2016   MCV 93.1 12/25/2016   PLT 219 12/25/2016     STUDIES: Ct Chest Wo Contrast  Result Date: 12/25/2016 CLINICAL DATA:  Stage IIIA small cell right upper lobe lung cancer status post concurrent chemoradiation therapy. Restaging. EXAM: CT CHEST WITHOUT CONTRAST TECHNIQUE: Multidetector CT imaging of the chest was performed following the standard protocol without IV contrast. COMPARISON:  09/08/2016 chest CT. FINDINGS: Cardiovascular: Normal heart size. Stable trace pericardial effusion/ thickening anteriorly. Left anterior descending, left circumflex and right coronary atherosclerosis. Left internal jugular MediPort terminates in the lower third of the superior vena cava. Atherosclerotic nonaneurysmal thoracic aorta. Normal caliber pulmonary arteries. Mediastinum/Nodes: No discrete thyroid nodules. Unremarkable esophagus. No pathologically enlarged axillary, mediastinal or gross hilar lymph nodes, noting limited sensitivity for the detection of hilar adenopathy on this noncontrast study. Lungs/Pleura: No pneumothorax. No pleural effusion. Moderate to severe centrilobular and paraseptal emphysema with diffuse bronchial wall thickening. New 6 mm solid subpleural posterior apical right upper lobe pulmonary nodule (series 3/image 30). There is stable minimal patchy subpleural reticulation in the posterior right mid lung at the site of the treated lesion (series 3/images 67-76). No acute consolidative airspace disease, lung masses or additional significant pulmonary nodules. Upper abdomen: Cholecystectomy. Stable bilateral adrenal adenomas measuring 1.5 cm on the right and 2.3 cm on the left. Musculoskeletal: No aggressive appearing focal osseous lesions. Mild thoracic spondylosis. IMPRESSION: 1. New 6 mm solid  subpleural posterior apical right upper lobe pulmonary nodule, indeterminate for inflammatory or metastatic disease. Recommend attention on follow-up chest CT in 3 months . 2. No findings suspicious for local tumor recurrence at the site of the treated nodule in the posterior right mid lung. 3. No thoracic adenopathy. 4. Three-vessel coronary atherosclerosis. 5. Stable bilateral adrenal adenomas. Aortic Atherosclerosis (ICD10-I70.0) and Emphysema (ICD10-J43.9). These results will be called to the ordering clinician or representative by the Radiologist Assistant, and communication documented in the PACS or zVision Dashboard. Electronically Signed   By: Ilona Sorrel M.D.   On: 12/25/2016 17:47    ASSESSMENT: Clinical stage IIIa small cell lung carcinoma of the  right upper lobe lung.  PLAN:    1. Clinical stage IIIa small cell lung carcinoma of the right upper lobe lung: CT scan results from December 25, 2016 reviewed independently and reported as above with no obvious evidence of persistent or recurrent disease. She was noted to have a new 6 mm subpleural nodule in her right upper lobe. Previously, MRI of the brain was negative and patient has completed PCI. Patient completed cycle 3 of cisplatin and etoposide on October 24-26, 2017. Given patient's extensive hospitalization after cycle 3, treatment was discontinued. No further intervention is needed at this time. Return to clinic in 3 months with repeat CT scan and further evaluation. 2. Anemia: Decreased, but stable. No intervention is needed at this time. Monitor. 3. Renal insufficiency: Patient's creatinine appears to be at her baseline, monitor. 4. Hyperglycemia: Elevated but improved. Monitor.   Patient expressed understanding and was in agreement with this plan. She also understands that She can call clinic at any time with any questions, concerns, or complaints.   Cancer Staging Primary cancer of right upper lobe of lung [facility]) Staging form:  Lung, AJCC 7th Edition - Clinical stage from 11/26/2015: Stage IIIA (T1a, N2, M0) - Signed by Lloyd Huger, MD on 11/26/2015   Lloyd Huger, MD   01/01/2017 10:00 AM

## 2016-12-28 ENCOUNTER — Other Ambulatory Visit: Payer: Self-pay | Admitting: *Deleted

## 2016-12-28 ENCOUNTER — Inpatient Hospital Stay (HOSPITAL_BASED_OUTPATIENT_CLINIC_OR_DEPARTMENT_OTHER): Payer: Medicare Other | Admitting: Oncology

## 2016-12-28 VITALS — BP 130/67 | HR 70 | Temp 96.7°F | Resp 18 | Wt 148.2 lb

## 2016-12-28 DIAGNOSIS — I700 Atherosclerosis of aorta: Secondary | ICD-10-CM | POA: Diagnosis not present

## 2016-12-28 DIAGNOSIS — E1165 Type 2 diabetes mellitus with hyperglycemia: Secondary | ICD-10-CM | POA: Diagnosis not present

## 2016-12-28 DIAGNOSIS — Z8701 Personal history of pneumonia (recurrent): Secondary | ICD-10-CM

## 2016-12-28 DIAGNOSIS — Z801 Family history of malignant neoplasm of trachea, bronchus and lung: Secondary | ICD-10-CM

## 2016-12-28 DIAGNOSIS — D649 Anemia, unspecified: Secondary | ICD-10-CM | POA: Diagnosis not present

## 2016-12-28 DIAGNOSIS — Z87891 Personal history of nicotine dependence: Secondary | ICD-10-CM

## 2016-12-28 DIAGNOSIS — E785 Hyperlipidemia, unspecified: Secondary | ICD-10-CM

## 2016-12-28 DIAGNOSIS — I10 Essential (primary) hypertension: Secondary | ICD-10-CM

## 2016-12-28 DIAGNOSIS — J449 Chronic obstructive pulmonary disease, unspecified: Secondary | ICD-10-CM

## 2016-12-28 DIAGNOSIS — N289 Disorder of kidney and ureter, unspecified: Secondary | ICD-10-CM

## 2016-12-28 DIAGNOSIS — Z9181 History of falling: Secondary | ICD-10-CM

## 2016-12-28 DIAGNOSIS — Z85118 Personal history of other malignant neoplasm of bronchus and lung: Secondary | ICD-10-CM | POA: Diagnosis not present

## 2016-12-28 DIAGNOSIS — Z9049 Acquired absence of other specified parts of digestive tract: Secondary | ICD-10-CM | POA: Diagnosis not present

## 2016-12-28 DIAGNOSIS — Z7984 Long term (current) use of oral hypoglycemic drugs: Secondary | ICD-10-CM

## 2016-12-28 DIAGNOSIS — D3501 Benign neoplasm of right adrenal gland: Secondary | ICD-10-CM

## 2016-12-28 DIAGNOSIS — Z8673 Personal history of transient ischemic attack (TIA), and cerebral infarction without residual deficits: Secondary | ICD-10-CM

## 2016-12-28 DIAGNOSIS — I2510 Atherosclerotic heart disease of native coronary artery without angina pectoris: Secondary | ICD-10-CM

## 2016-12-28 DIAGNOSIS — C3411 Malignant neoplasm of upper lobe, right bronchus or lung: Secondary | ICD-10-CM

## 2016-12-28 NOTE — Progress Notes (Signed)
Patient here today for follow up regarding lung cancer, reports falls x 2 at home.

## 2016-12-29 ENCOUNTER — Ambulatory Visit (INDEPENDENT_AMBULATORY_CARE_PROVIDER_SITE_OTHER): Payer: Medicare Other | Admitting: Family Medicine

## 2016-12-29 ENCOUNTER — Other Ambulatory Visit: Payer: Self-pay | Admitting: *Deleted

## 2016-12-29 VITALS — BP 102/60 | HR 76 | Temp 97.6°F | Resp 18 | Wt 149.8 lb

## 2016-12-29 DIAGNOSIS — C3411 Malignant neoplasm of upper lobe, right bronchus or lung: Secondary | ICD-10-CM

## 2016-12-29 DIAGNOSIS — H60543 Acute eczematoid otitis externa, bilateral: Secondary | ICD-10-CM | POA: Diagnosis not present

## 2016-12-29 DIAGNOSIS — E1342 Other specified diabetes mellitus with diabetic polyneuropathy: Secondary | ICD-10-CM | POA: Diagnosis not present

## 2016-12-29 DIAGNOSIS — G8929 Other chronic pain: Secondary | ICD-10-CM

## 2016-12-29 DIAGNOSIS — W19XXXA Unspecified fall, initial encounter: Secondary | ICD-10-CM | POA: Diagnosis not present

## 2016-12-29 DIAGNOSIS — M5442 Lumbago with sciatica, left side: Secondary | ICD-10-CM | POA: Diagnosis not present

## 2016-12-29 DIAGNOSIS — Z23 Encounter for immunization: Secondary | ICD-10-CM

## 2016-12-29 DIAGNOSIS — M5441 Lumbago with sciatica, right side: Secondary | ICD-10-CM | POA: Diagnosis not present

## 2016-12-29 DIAGNOSIS — E1121 Type 2 diabetes mellitus with diabetic nephropathy: Secondary | ICD-10-CM | POA: Diagnosis not present

## 2016-12-29 LAB — POCT UA - MICROALBUMIN: Microalbumin Ur, POC: 50 mg/L

## 2016-12-29 LAB — POCT GLYCOSYLATED HEMOGLOBIN (HGB A1C): HEMOGLOBIN A1C: 6.6

## 2016-12-29 MED ORDER — MOMETASONE FUROATE 0.1 % EX CREA: TOPICAL_CREAM | CUTANEOUS | 0 refills | Status: DC

## 2016-12-29 MED ORDER — DIAZEPAM 5 MG PO TABS: 5.0000 mg | ORAL_TABLET | Freq: Two times a day (BID) | ORAL | 5 refills | Status: DC | PRN

## 2016-12-29 NOTE — Progress Notes (Signed)
[name] [name]  MRN: [id number] DOB: [birth year]  Subjective:  HPI  Patient is here for 4 months follow up. DM: patient is checking her sugar fasting, readings have been, 99, 115, 120. Have not had any readings near 200s or over 200 in the past 2 months. Chronic numbness tingling sensation present. No hypoglycemic episodes or symptoms. Lab Results  Component Value Date   HGBA1C 7.4 08/26/2016   Wt Readings from Last 3 Encounters:  12/29/16 149 lb 12.8 oz (67.9 kg)  12/28/16 148 lb 3.2 oz (67.2 kg)  12/09/16 149 lb 14.6 oz (68 kg)   Last lab work: CBC on 12/25/16, PTH and BMP on 12/22/16, TSH on 07/14/16, Lipid 08/19/15. She has had 2 falls recently.  Lung cancer per oncology. Patient Active Problem List   Diagnosis Date Noted  . AKI (acute kidney injury) ([place]) 07/15/2016  . Protein-calorie malnutrition, severe 02/08/2016  . Acute renal failure (ARF) ([place]) 02/05/2016  . Primary cancer of right upper lobe of lung ([place]) 11/20/2015  . Adenopathy   . Abnormal CT lung screening 10/17/2015  . Personal history of tobacco use, presenting hazards to health 10/15/2015  . NSTEMI (non-ST elevated myocardial infarction) ([place]) 01/27/2015  . Chronic vulvitis 09/26/2014  . Allergic reaction 09/26/2014  . Carotid stenosis 08/30/2014  . Cervical nerve root disorder 08/10/2014  . Absolute anemia 08/10/2014  . CAD in native artery 08/10/2014  . B12 deficiency 08/10/2014  . Back ache 08/10/2014  . Bronchitis, chronic ([place]) 08/10/2014  . Diabetes mellitus with polyneuropathy ([place]) 08/10/2014  . Can't get food down 08/10/2014  . Eczema of external ear 08/10/2014  . Accumulation of fluid in tissues 08/10/2014  . Gout 08/10/2014  . Adult hypothyroidism 08/10/2014  . Mononeuritis 08/10/2014  . Muscle ache 08/10/2014  . Disorder of peripheral nervous system 08/10/2014  . Lesion of vulva 08/10/2014  . Cervical spondylosis with radiculopathy 10/19/2013  . Unstable angina ([place]) 08/15/2013  . COPD  exacerbation ([place]) 03/29/2013  . CAD (coronary artery disease) 06/22/2011  . COPD (chronic obstructive pulmonary disease) with emphysema ([place]) 03/07/2010  . CHEST PAIN UNSPECIFIED 07/22/2009  . HLD (hyperlipidemia) 04/01/2009  . Malaise and fatigue 04/01/2009  . Hyperlipidemia 01/18/2009  . TOBACCO ABUSE 01/18/2009  . HYPERTENSION, BENIGN 01/18/2009  . CLAUDICATION 01/18/2009  . Pain in limb 01/18/2009  . CAFL (chronic airflow limitation) ([place]) 01/20/2007  . Late effects of cerebrovascular disease 01/10/2007  . Essential (primary) hypertension 12/22/2006    Past Medical History:  Diagnosis Date  . Abnormal CT lung screening 10/17/2015  . COPD (chronic obstructive pulmonary disease) ([place])   . Coronary artery disease, non-occlusive    a. cath 2006: min nonobs CAD; b. cath 12/2010: cath LAD 50%, RCA 60%; c. 08/2013: Minimal luminal irregs, right dominant system with no significant CAD, diffuse luminal irregs noted. Normal EF 55%, no AS or MS.   . Diabetes mellitus   . Hyperlipemia    Followed by Dr. Rosanna Randy  . Hypertension   . Lung cancer ([place])   . Macular degeneration    rt  . Personal history of tobacco use, presenting hazards to health 10/15/2015  . Pneumonia    hx  . Shortness of breath   . Stroke [facility])     Social History   Social History  . Marital status: Married    Spouse name: N/A  . Number of children: N/A  . Years of education: N/A   Occupational History  . Retired, Environmental consultant Retired   Science writer  History Main Topics  . Smoking status: Former Smoker    Packs/day: 1.00    Years: 50.00    Types: Cigarettes    Quit date: 10/03/2015  . Smokeless tobacco: Never Used     Comment: smokes 3 cigs daily 05/06/15. Pt instructed to quit.  . Alcohol use No  . Drug use: No  . Sexual activity: No   Other Topics Concern  . Not on file   Social History Narrative   Married with 4 children   Gets regular exercise    Outpatient Encounter Prescriptions as of  12/29/2016  Medication Sig Note  . albuterol (PROVENTIL) (5 MG/ML) 0.5% nebulizer solution Take 0.5 mLs (2.5 mg total) by nebulization 2 (two) times daily.   Marland Kitchen atorvastatin (LIPITOR) 40 MG tablet Take 0.5 tablets (20 mg total) by mouth at bedtime.   . budesonide-formoterol (SYMBICORT) 160-4.5 MCG/ACT inhaler Inhale 2 puffs into the lungs 2 (two) times daily.   . diazepam (VALIUM) 5 MG tablet Take 1 tablet (5 mg total) by mouth every 6 (six) hours as needed for muscle spasms.   Marland Kitchen gabapentin (NEURONTIN) 600 MG tablet Take 0.5 tablets (300 mg total) by mouth 2 (two) times daily.   Marland Kitchen glimepiride (AMARYL) 2 MG tablet Take 1 tablet (2 mg total) by mouth 2 (two) times daily.   Marland Kitchen HYDROcodone-acetaminophen (NORCO) 5-325 MG tablet Take 1 tablet by mouth every 6 (six) hours as needed for moderate pain or severe pain.   . isosorbide mononitrate (IMDUR) 30 MG 24 hr tablet TAKE 1 TABLET BY MOUTH  DAILY   . magnesium oxide (MAG-OX) 400 (241.3 MG) MG tablet Take 1 tablet by mouth 2 (two) times daily.   . metoprolol tartrate (LOPRESSOR) 25 MG tablet Take 1 tablet (25 mg total) by mouth 2 (two) times daily.   . Multiple Vitamins-Minerals (ICAPS) CAPS Take 2 capsules by mouth daily. Reported on 04/17/2015   . nitroGLYCERIN (NITROSTAT) 0.4 MG SL tablet Place 1 tablet (0.4 mg total) under the tongue every 5 (five) minutes as needed for chest pain. 05/23/2015: As needed.   . nystatin cream (MYCOSTATIN) Apply 1 application topically 2 (two) times daily.   . ondansetron (ZOFRAN) 8 MG tablet Take 1 tablet (8 mg total) by mouth every 8 (eight) hours as needed for nausea or vomiting.   . ONE TOUCH ULTRA TEST test strip USE TO CHECK BLOOD SUGAR ONCE DAILY   . pantoprazole (PROTONIX) 40 MG tablet TAKE 1 TABLET BY MOUTH  DAILY   . triamcinolone cream (KENALOG) 0.1 % Apply 1 application topically 2 (two) times daily.    Facility-Administered Encounter Medications as of 12/29/2016  Medication  . ondansetron (ZOFRAN) 8 mg,  dexamethasone (DECADRON) 10 mg in sodium chloride 0.9 % 50 mL IVPB    Allergies  Allergen Reactions  . Coconut Fatty Acids Swelling    Throat swells    Review of Systems  Constitutional: Positive for malaise/fatigue.  Respiratory: Positive for cough and shortness of breath.   Cardiovascular: Negative.   Gastrointestinal: Negative.   Musculoskeletal: Positive for back pain and joint pain.  Skin:       Dry skin  Neurological: Positive for dizziness, tingling and weakness (in legs.).       Numbness    Objective:  BP 102/60   Pulse 76   Temp 97.6 F (36.4 C)   Resp 18   Wt 149 lb 12.8 oz (67.9 kg)   BMI 27.40 kg/m   Physical Exam  Constitutional: She  is oriented to person, place, and time and well-developed, well-nourished, and in no distress.  HENT:  Head: Normocephalic and atraumatic.  Right Ear: External ear normal.  Left Ear: External ear normal.  Nose: Nose normal.  Eyes: Pupils are equal, round, and reactive to light. Conjunctivae are normal.  Neck: Normal range of motion. Neck supple.  Cardiovascular: Normal rate, regular rhythm, normal heart sounds and intact distal pulses.  Exam reveals no gallop.   No murmur heard. Pulmonary/Chest: Effort normal and breath sounds normal. No respiratory distress. She has no wheezes.  Abdominal: Soft.  Neurological: She is alert and oriented to person, place, and time.  Skin: Skin is warm and dry.  Psychiatric: Mood, memory, affect and judgment normal.    Diabetic Foot Exam - Simple   Simple Foot Form Diabetic Foot exam was performed with the following findings:  Yes 12/29/2016  2:15 PM  Visual Inspection No deformities, no ulcerations, no other skin breakdown bilaterally:  Yes Sensation Testing See comments:  Yes Pulse Check Comments Decreased sensation in toes bilateral and distal foot bilateral     Assessment and Plan :  1. Type 2 diabetes mellitus with diabetic nephropathy, without long-term current use of insulin  (HCC) 6.6. Better. Continue working on walking as much as able to tolerate. - POCT HgB A1C  2. Diabetic polyneuropathy associated with other specified diabetes mellitus ([place]) 3. Chronic low back pain with bilateral sciatica, unspecified back pain laterality Refill on Diazepam given today for back spasms, discussed risks of taking this medication regularly.  4. Primary cancer of right upper lobe of lung [facility]) Following oncologist.  5. Need for immunization against influenza - Flu vaccine HIGH DOSE PF (Fluzone High dose)  6. Fall Due to tripping over her dog. Discussed this with patient. If patient starts getting weaker will refer patient to physical therapy at that time.  7. Eczema external ear, bilateral Try Mometasone cream and apply a small amount nightly.  Colon cancer screening Cologuard kit will be re sent to the patient.  HPI, Exam and A&P transcribed by Tiffany Kocher, RMA under direction and in the presence of Miguel Aschoff, MD. I have done the exam and reviewed the chart and it is accurate to the best of my knowledge. Development worker, community has been used and  any errors in dictation or transcription are unintentional. Miguel Aschoff M.D. [facility]

## 2017-01-05 DIAGNOSIS — H353213 Exudative age-related macular degeneration, right eye, with inactive scar: Secondary | ICD-10-CM | POA: Diagnosis not present

## 2017-01-05 DIAGNOSIS — H353122 Nonexudative age-related macular degeneration, left eye, intermediate dry stage: Secondary | ICD-10-CM | POA: Diagnosis not present

## 2017-01-05 LAB — HM DIABETES EYE EXAM

## 2017-01-07 ENCOUNTER — Encounter: Payer: Self-pay | Admitting: Ophthalmology

## 2017-01-11 ENCOUNTER — Telehealth: Payer: Self-pay | Admitting: Family Medicine

## 2017-01-11 NOTE — Telephone Encounter (Signed)
Sarah, do I just reorder the colorguard test in [facility] to re-send to her house? Or does she need to contact the company?

## 2017-01-11 NOTE — Telephone Encounter (Signed)
Pt states cologuard was not delivery to her.  States she received a letter stating the address was not found.  I confirmed her address was correct in [employer].  She is wanting to know how she can get it.

## 2017-01-12 NOTE — Telephone Encounter (Signed)
Order for cologuard refaxed to [employer]

## 2017-01-23 DIAGNOSIS — J449 Chronic obstructive pulmonary disease, unspecified: Secondary | ICD-10-CM | POA: Diagnosis not present

## 2017-01-23 DIAGNOSIS — R0602 Shortness of breath: Secondary | ICD-10-CM | POA: Diagnosis not present

## 2017-01-25 DIAGNOSIS — J449 Chronic obstructive pulmonary disease, unspecified: Secondary | ICD-10-CM | POA: Diagnosis not present

## 2017-02-23 DIAGNOSIS — J449 Chronic obstructive pulmonary disease, unspecified: Secondary | ICD-10-CM | POA: Diagnosis not present

## 2017-02-23 DIAGNOSIS — R0602 Shortness of breath: Secondary | ICD-10-CM | POA: Diagnosis not present

## 2017-02-25 DIAGNOSIS — J449 Chronic obstructive pulmonary disease, unspecified: Secondary | ICD-10-CM | POA: Diagnosis not present

## 2017-03-17 ENCOUNTER — Ambulatory Visit
Admission: RE | Admit: 2017-03-17 | Discharge: 2017-03-17 | Disposition: A | Discharge status: Home / Self Care | Payer: Medicare Other | Source: Ambulatory Visit | Attending: Oncology | Admitting: Oncology

## 2017-03-17 ENCOUNTER — Ambulatory Visit: Admission: RE | Admit: 2017-03-17 | Payer: Medicare Other | Source: Ambulatory Visit

## 2017-03-17 DIAGNOSIS — I700 Atherosclerosis of aorta: Secondary | ICD-10-CM | POA: Insufficient documentation

## 2017-03-17 DIAGNOSIS — C3411 Malignant neoplasm of upper lobe, right bronchus or lung: Secondary | ICD-10-CM | POA: Insufficient documentation

## 2017-03-17 DIAGNOSIS — J432 Centrilobular emphysema: Secondary | ICD-10-CM | POA: Insufficient documentation

## 2017-03-17 LAB — POCT I-STAT CREATININE: CREATININE: 1.3 mg/dL — AB (ref 0.44–1.00)

## 2017-03-17 MED ORDER — IOPAMIDOL (ISOVUE-300) INJECTION 61%
60.0000 mL | Freq: Once | INTRAVENOUS | Status: AC | PRN
  Administered 2017-03-17: 60 mL via INTRAVENOUS

## 2017-03-20 NOTE — Progress Notes (Signed)
[place]  [phone] [phone]) [phone]  ID: [name] [name] OB: 12-20-1940  MR#: [id number]  HWE#:[id number]  Patient Care Team: Jerrol Banana., MD as PCP - General (Unknown Physician Specialty) [name], MD as Consulting Physician (Cardiology) [name], MD as Consulting Physician (Vascular Surgery)  CHIEF COMPLAINT: Clinical stage IIIa small cell lung carcinoma of the right upper lobe lung.  INTERVAL HISTORY: Patient returns to clinic today for further evaluation and discussion of her imaging results. She currently feels well and is asymptomatic. She had a recent fall, but denies any significant trauma. She has no neurologic complaints. She denies any fevers. She denies any chest pain, shortness of breath, cough, or hemoptysis. She has a good appetite and denies weight loss. She denies any nausea, vomiting, constipation, or diarrhea. She has no urinary complaints. Patient offers no further specific complaints today.  REVIEW OF SYSTEMS:   Review of Systems  Constitutional: Negative for fever, malaise/fatigue and weight loss.  Respiratory: Negative for cough, hemoptysis and shortness of breath.   Cardiovascular: Negative.  Negative for chest pain and leg swelling.  Gastrointestinal: Negative for abdominal pain, melena, nausea and vomiting.  Genitourinary: Negative.   Musculoskeletal: Positive for falls.  Skin: Negative.  Negative for rash.  Neurological: Negative for sensory change and weakness.  Psychiatric/Behavioral: Negative.  The patient is not nervous/anxious.     As per HPI. Otherwise, a complete review of systems is negative.  PAST MEDICAL HISTORY: Past Medical History:  Diagnosis Date  . Abnormal CT lung screening 10/17/2015  . COPD (chronic obstructive pulmonary disease) ([place])   . Coronary artery disease, non-occlusive    a. cath 2006: min nonobs CAD; b. cath 12/2010: cath LAD 50%, RCA 60%; c. 08/2013: Minimal luminal  irregs, right dominant system with no significant CAD, diffuse luminal irregs noted. Normal EF 55%, no AS or MS.   . Diabetes mellitus   . Hyperlipemia    Followed by Dr. Rosanna Randy  . Hypertension   . Lung cancer ([place])   . Macular degeneration    rt  . Personal history of tobacco use, presenting hazards to health 10/15/2015  . Pneumonia    hx  . Shortness of breath   . Stroke [facility])     PAST SURGICAL HISTORY: Past Surgical History:  Procedure Laterality Date  . ABDOMINAL HYSTERECTOMY    . ANTERIOR CERVICAL DECOMP/DISCECTOMY FUSION N/A 10/19/2013   Procedure: CERVICAL FIVE-SIX ANTERIOR CERVICAL DECOMPRESSION WITH FUSION INTERBODY PROSTHESIS PLATING AND PEEK CAGE;  Surgeon: Ophelia Charter, MD;  Location: [place] [facility];  Service: Neurosurgery;  Laterality: N/A;  . BACK SURGERY  80's  . BREAST CYST EXCISION Left    left negative   . CARDIAC CATHETERIZATION  05/2004  . CATARACT EXTRACTION Left   . CHOLECYSTECTOMY    . ENDARTERECTOMY Left 10/24/2014   Procedure: ENDARTERECTOMY CAROTID;  Surgeon: Algernon Huxley, MD;  Location: [facility] ORS;  Service: Vascular;  Laterality: Left;  . ENDOBRONCHIAL ULTRASOUND N/A 11/14/2015   Procedure: ENDOBRONCHIAL ULTRASOUND;  Surgeon: [name], MD;  Location: [facility] ORS;  Service: Cardiopulmonary;  Laterality: N/A;  . PERIPHERAL VASCULAR CATHETERIZATION N/A 12/04/2015   Procedure: Glori Luis Cath Insertion;  Surgeon: Algernon Huxley, MD;  Location: [place] [facility];  Service: Cardiovascular;  Laterality: N/A;  . TONSILLECTOMY AND ADENOIDECTOMY    . VESICOVAGINAL FISTULA CLOSURE W/ TAH      FAMILY HISTORY: Family History  Problem Relation Age of Onset  . Stroke Mother  Massive  . Heart attack Father        Massive  . Heart attack Brother   . Heart attack Brother   . Lung cancer Maternal Grandfather   . Heart attack Paternal Grandmother        MI       ADVANCED DIRECTIVES (Y/N):  N   HEALTH MAINTENANCE: Social History   Tobacco Use  . Smoking  status: Former Smoker    Packs/day: 1.00    Years: 50.00    Pack years: 50.00    Types: Cigarettes    Last attempt to quit: 10/03/2015    Years since quitting: 1.4  . Smokeless tobacco: Never Used  . Tobacco comment: smokes 3 cigs daily 05/06/15. Pt instructed to quit.  Substance Use Topics  . Alcohol use: No    Alcohol/week: 0.0 oz  . Drug use: No     Colonoscopy:  PAP:  Bone density:  Lipid panel:  Allergies  Allergen Reactions  . Coconut Fatty Acids Swelling    Throat swells    Current Outpatient Medications  Medication Sig Dispense Refill  . albuterol (PROVENTIL) (5 MG/ML) 0.5% nebulizer solution Take 0.5 mLs (2.5 mg total) by nebulization 2 (two) times daily. 20 mL 11  . atorvastatin (LIPITOR) 40 MG tablet Take 0.5 tablets (20 mg total) by mouth at bedtime. 90 tablet 3  . budesonide-formoterol (SYMBICORT) 160-4.5 MCG/ACT inhaler Inhale 2 puffs into the lungs 2 (two) times daily. 1 Inhaler 0  . diazepam (VALIUM) 5 MG tablet Take 1 tablet (5 mg total) by mouth every 12 (twelve) hours as needed for muscle spasms. 30 tablet 5  . gabapentin (NEURONTIN) 600 MG tablet Take 0.5 tablets (300 mg total) by mouth 2 (two) times daily. 60 tablet 2  . glimepiride (AMARYL) 2 MG tablet Take 1 tablet (2 mg total) by mouth 2 (two) times daily. 60 tablet 2  . HYDROcodone-acetaminophen (NORCO) 5-325 MG tablet Take 1 tablet by mouth every 6 (six) hours as needed for moderate pain or severe pain. 20 tablet 0  . isosorbide mononitrate (IMDUR) 30 MG 24 hr tablet TAKE 1 TABLET BY MOUTH  DAILY 90 tablet 0  . isosorbide mononitrate (IMDUR) 30 MG 24 hr tablet TAKE 1 TABLET BY MOUTH  DAILY 90 tablet 3  . magnesium oxide (MAG-OX) 400 (241.3 MG) MG tablet Take 1 tablet by mouth 2 (two) times daily.    . metoprolol tartrate (LOPRESSOR) 25 MG tablet Take 1 tablet (25 mg total) by mouth 2 (two) times daily. 180 tablet 3  . mometasone (ELOCON) 0.1 % cream Apply small amount nightly to each ear 45 g 0  .  Multiple Vitamins-Minerals (ICAPS) CAPS Take 2 capsules by mouth daily. Reported on 04/17/2015    . nitroGLYCERIN (NITROSTAT) 0.4 MG SL tablet Place 1 tablet (0.4 mg total) under the tongue every 5 (five) minutes as needed for chest pain. 25 tablet 3  . nystatin cream (MYCOSTATIN) Apply 1 application topically 2 (two) times daily. 30 g 0  . ondansetron (ZOFRAN) 8 MG tablet Take 1 tablet (8 mg total) by mouth every 8 (eight) hours as needed for nausea or vomiting. 30 tablet 1  . ONE TOUCH ULTRA TEST test strip USE TO CHECK BLOOD SUGAR ONCE DAILY 100 each 11  . pantoprazole (PROTONIX) 40 MG tablet TAKE 1 TABLET BY MOUTH  DAILY 90 tablet 3  . triamcinolone cream (KENALOG) 0.1 % Apply 1 application topically 2 (two) times daily. 30 g 0   No  current facility-administered medications for this visit.    Facility-Administered Medications Ordered in Other Visits  Medication Dose Route Frequency Provider Last Rate Last Dose  . ondansetron (ZOFRAN) 8 mg, dexamethasone (DECADRON) 10 mg in sodium chloride 0.9 % 50 mL IVPB   Intravenous Once Lloyd Huger, MD        OBJECTIVE: Vitals:   03/22/17 1355  BP: (!) 145/76  Resp: 18  Temp: 98.7 F (37.1 C)     Body mass index is 26.8 kg/m.    ECOG FS:0 - Asymptomatic  General: Well-developed, well-nourished, no acute distress. Eyes: Pink conjunctiva, anicteric sclera. Lungs: Clear to auscultation bilaterally. Heart: Regular rate and rhythm. No rubs, murmurs, or gallops. Abdomen: Soft, nontender, nondistended. No organomegaly noted, normoactive bowel sounds. Musculoskeletal: No edema, cyanosis, or clubbing. Neuro: Alert, answering all questions appropriately. Cranial nerves grossly intact. Skin: No rashes or petechiae noted. Psych: Normal affect.   LAB RESULTS:  Lab Results  Component Value Date   NA 140 12/22/2016   K 4.9 12/22/2016   CL 98 07/30/2016   CO2 26 07/30/2016   GLUCOSE 131 (H) 07/30/2016   BUN 19 12/22/2016   CREATININE 1.30  (H) 03/17/2017   CALCIUM 9.6 07/30/2016   PROT 7.3 03/02/2016   ALBUMIN 4.1 07/30/2016   AST 13 (L) 03/02/2016   ALT 11 (L) 03/02/2016   ALKPHOS 67 03/02/2016   BILITOT 0.4 03/02/2016   GFRNONAA 27 (L) 07/30/2016   GFRAA 32 (L) 07/30/2016    Lab Results  Component Value Date   WBC 6.8 12/25/2016   NEUTROABS 4.4 07/14/2016   HGB 10.1 (L) 12/25/2016   HCT 29.2 (L) 12/25/2016   MCV 93.1 12/25/2016   PLT 219 12/25/2016     STUDIES: Ct Chest W Contrast  Result Date: 03/17/2017 CLINICAL DATA:  Restaging right upper lobe lung cancer post radiation and chemotherapy approximately 1 year ago. Currently asymptomatic. EXAM: CT CHEST WITH CONTRAST TECHNIQUE: Multidetector CT imaging of the chest was performed during intravenous contrast administration. CONTRAST:  41mL ISOVUE-300 IOPAMIDOL (ISOVUE-300) INJECTION 61% COMPARISON:  Chest CT 12/25/2016 and 09/08/2016 FINDINGS: Cardiovascular: Similar atherosclerosis vessels and coronary arteries. No acute vascular findings are evident. Left IJ Port-A-Cath extends to the superior cavoatrial junction. The heart size is normal. There is no pericardial effusion. Mediastinum/Nodes: There are no enlarged mediastinal, hilar or axillary lymph nodes. The thyroid gland, trachea and esophagus demonstrate no significant findings. Lungs/Pleura: There is no pleural effusion. Moderate to severe centrilobular and paraseptal emphysema again noted. Subpleural right upper lobe nodule medially measuring 6 x 5 mm on image 29 is stable. There is increased non focal ground-glass opacity dependently in the right lower lobe which is probably inflammatory or treatment related. There are small nodular components on image 61 and 71. Mild perifissural nodularity on the right is stable. Upper abdomen: The visualized upper abdomen appears stable without suspicious findings. There are stable bilateral adrenal adenomas. Musculoskeletal/Chest wall: There is no chest wall mass or suspicious  osseous finding. Stable small sebaceous cyst in the left anterior subcutaneous fat. IMPRESSION: 1. The new nodule noted medially in the right upper on the most recent study is stable. Continued CT follow-up recommended in 6-12 months. 2. Increased non focal ground-glass density in the right lower lobe, likely inflammatory or treatment related. 3. No findings suspicious for metastatic disease or local recurrence. 4. Moderate to severe centrilobular emphysema and aortic atherosclerosis again noted. Electronically Signed   By: Richardean Sale M.D.   On: 03/17/2017 15:01  ASSESSMENT: Clinical stage IIIa small cell lung carcinoma of the right upper lobe lung.  PLAN:    1. Clinical stage IIIa small cell lung carcinoma of the right upper lobe lung: CT scan results from March 17, 2017 reviewed independently and reported as above with no obvious evidence of persistent or recurrent disease.  The 6 mm subpleural nodule in her right upper lobe is unchanged and likely benign. Previously, MRI of the brain was negative and patient has completed PCI. Patient completed cycle 3 of cisplatin and etoposide on October 24-26, 2017. Given patient's extensive hospitalization after cycle 3, treatment was discontinued. No further intervention is needed at this time. Return to clinic in 6 months with repeat CT scan and further evaluation. 2. Anemia: Decreased, but stable. No intervention is needed at this time. Monitor. 3. Renal insufficiency: Patient's creatinine appears to be at her baseline, monitor.   Patient expressed understanding and was in agreement with this plan. She also understands that She can call clinic at any time with any questions, concerns, or complaints.   Cancer Staging Primary cancer of right upper lobe of lung [facility]) Staging form: Lung, AJCC 7th Edition - Clinical stage from 11/26/2015: Stage IIIA (T1a, N2, M0) - Signed by Lloyd Huger, MD on 11/26/2015   Lloyd Huger, MD   03/23/2017  2:08 PM

## 2017-03-22 ENCOUNTER — Inpatient Hospital Stay: Payer: Medicare Other | Attending: Oncology | Admitting: Oncology

## 2017-03-22 ENCOUNTER — Other Ambulatory Visit: Payer: Self-pay | Admitting: Cardiovascular Disease

## 2017-03-22 VITALS — BP 145/76 | Temp 98.7°F | Resp 18 | Wt 146.5 lb

## 2017-03-22 DIAGNOSIS — Z87891 Personal history of nicotine dependence: Secondary | ICD-10-CM | POA: Diagnosis not present

## 2017-03-22 DIAGNOSIS — D3502 Benign neoplasm of left adrenal gland: Secondary | ICD-10-CM | POA: Diagnosis not present

## 2017-03-22 DIAGNOSIS — I10 Essential (primary) hypertension: Secondary | ICD-10-CM | POA: Diagnosis not present

## 2017-03-22 DIAGNOSIS — Z90722 Acquired absence of ovaries, bilateral: Secondary | ICD-10-CM | POA: Diagnosis not present

## 2017-03-22 DIAGNOSIS — Z9221 Personal history of antineoplastic chemotherapy: Secondary | ICD-10-CM | POA: Diagnosis not present

## 2017-03-22 DIAGNOSIS — J449 Chronic obstructive pulmonary disease, unspecified: Secondary | ICD-10-CM | POA: Diagnosis not present

## 2017-03-22 DIAGNOSIS — Z79899 Other long term (current) drug therapy: Secondary | ICD-10-CM | POA: Diagnosis not present

## 2017-03-22 DIAGNOSIS — D649 Anemia, unspecified: Secondary | ICD-10-CM

## 2017-03-22 DIAGNOSIS — I700 Atherosclerosis of aorta: Secondary | ICD-10-CM | POA: Insufficient documentation

## 2017-03-22 DIAGNOSIS — E785 Hyperlipidemia, unspecified: Secondary | ICD-10-CM | POA: Diagnosis not present

## 2017-03-22 DIAGNOSIS — Z7984 Long term (current) use of oral hypoglycemic drugs: Secondary | ICD-10-CM | POA: Diagnosis not present

## 2017-03-22 DIAGNOSIS — E119 Type 2 diabetes mellitus without complications: Secondary | ICD-10-CM | POA: Insufficient documentation

## 2017-03-22 DIAGNOSIS — Z8701 Personal history of pneumonia (recurrent): Secondary | ICD-10-CM

## 2017-03-22 DIAGNOSIS — C3411 Malignant neoplasm of upper lobe, right bronchus or lung: Secondary | ICD-10-CM

## 2017-03-22 DIAGNOSIS — R0602 Shortness of breath: Secondary | ICD-10-CM | POA: Diagnosis not present

## 2017-03-22 DIAGNOSIS — Z801 Family history of malignant neoplasm of trachea, bronchus and lung: Secondary | ICD-10-CM

## 2017-03-22 DIAGNOSIS — I2510 Atherosclerotic heart disease of native coronary artery without angina pectoris: Secondary | ICD-10-CM | POA: Insufficient documentation

## 2017-03-22 DIAGNOSIS — D3501 Benign neoplasm of right adrenal gland: Secondary | ICD-10-CM | POA: Insufficient documentation

## 2017-03-22 DIAGNOSIS — Z85118 Personal history of other malignant neoplasm of bronchus and lung: Secondary | ICD-10-CM | POA: Diagnosis not present

## 2017-03-22 DIAGNOSIS — Z90710 Acquired absence of both cervix and uterus: Secondary | ICD-10-CM | POA: Insufficient documentation

## 2017-03-22 DIAGNOSIS — Z8673 Personal history of transient ischemic attack (TIA), and cerebral infarction without residual deficits: Secondary | ICD-10-CM

## 2017-03-22 DIAGNOSIS — L723 Sebaceous cyst: Secondary | ICD-10-CM | POA: Insufficient documentation

## 2017-03-22 DIAGNOSIS — N2889 Other specified disorders of kidney and ureter: Secondary | ICD-10-CM | POA: Diagnosis not present

## 2017-03-22 NOTE — Progress Notes (Signed)
Patient is here for follow up, she mentions she has fallen twice in the past two weeks. She has no major complaints,here for test results.

## 2017-03-25 DIAGNOSIS — J449 Chronic obstructive pulmonary disease, unspecified: Secondary | ICD-10-CM | POA: Diagnosis not present

## 2017-03-25 DIAGNOSIS — R0602 Shortness of breath: Secondary | ICD-10-CM | POA: Diagnosis not present

## 2017-03-27 DIAGNOSIS — J449 Chronic obstructive pulmonary disease, unspecified: Secondary | ICD-10-CM | POA: Diagnosis not present

## 2017-03-30 ENCOUNTER — Emergency Department: Payer: Medicare Other

## 2017-03-30 ENCOUNTER — Other Ambulatory Visit: Payer: Self-pay

## 2017-03-30 ENCOUNTER — Encounter: Payer: Self-pay | Admitting: Emergency Medicine

## 2017-03-30 ENCOUNTER — Inpatient Hospital Stay
Admission: EM | Admit: 2017-03-30 | Discharge: 2017-04-03 | DRG: 190 | Disposition: A | Discharge status: Home Health | Payer: Medicare Other | Attending: Internal Medicine | Admitting: Internal Medicine

## 2017-03-30 DIAGNOSIS — E1165 Type 2 diabetes mellitus with hyperglycemia: Secondary | ICD-10-CM | POA: Diagnosis not present

## 2017-03-30 DIAGNOSIS — Z79899 Other long term (current) drug therapy: Secondary | ICD-10-CM

## 2017-03-30 DIAGNOSIS — N179 Acute kidney failure, unspecified: Secondary | ICD-10-CM | POA: Diagnosis not present

## 2017-03-30 DIAGNOSIS — Z8249 Family history of ischemic heart disease and other diseases of the circulatory system: Secondary | ICD-10-CM

## 2017-03-30 DIAGNOSIS — Z7984 Long term (current) use of oral hypoglycemic drugs: Secondary | ICD-10-CM | POA: Diagnosis not present

## 2017-03-30 DIAGNOSIS — R05 Cough: Secondary | ICD-10-CM | POA: Diagnosis not present

## 2017-03-30 DIAGNOSIS — Z90710 Acquired absence of both cervix and uterus: Secondary | ICD-10-CM

## 2017-03-30 DIAGNOSIS — E1140 Type 2 diabetes mellitus with diabetic neuropathy, unspecified: Secondary | ICD-10-CM | POA: Diagnosis not present

## 2017-03-30 DIAGNOSIS — I10 Essential (primary) hypertension: Secondary | ICD-10-CM | POA: Diagnosis not present

## 2017-03-30 DIAGNOSIS — Z8673 Personal history of transient ischemic attack (TIA), and cerebral infarction without residual deficits: Secondary | ICD-10-CM | POA: Diagnosis not present

## 2017-03-30 DIAGNOSIS — I2510 Atherosclerotic heart disease of native coronary artery without angina pectoris: Secondary | ICD-10-CM | POA: Diagnosis not present

## 2017-03-30 DIAGNOSIS — R296 Repeated falls: Secondary | ICD-10-CM | POA: Diagnosis not present

## 2017-03-30 DIAGNOSIS — J9601 Acute respiratory failure with hypoxia: Secondary | ICD-10-CM | POA: Diagnosis not present

## 2017-03-30 DIAGNOSIS — E785 Hyperlipidemia, unspecified: Secondary | ICD-10-CM | POA: Diagnosis not present

## 2017-03-30 DIAGNOSIS — N183 Chronic kidney disease, stage 3 (moderate): Secondary | ICD-10-CM | POA: Diagnosis present

## 2017-03-30 DIAGNOSIS — K219 Gastro-esophageal reflux disease without esophagitis: Secondary | ICD-10-CM | POA: Diagnosis present

## 2017-03-30 DIAGNOSIS — E440 Moderate protein-calorie malnutrition: Secondary | ICD-10-CM

## 2017-03-30 DIAGNOSIS — Z6825 Body mass index (BMI) 25.0-25.9, adult: Secondary | ICD-10-CM

## 2017-03-30 DIAGNOSIS — E1122 Type 2 diabetes mellitus with diabetic chronic kidney disease: Secondary | ICD-10-CM | POA: Diagnosis not present

## 2017-03-30 DIAGNOSIS — Z9981 Dependence on supplemental oxygen: Secondary | ICD-10-CM

## 2017-03-30 DIAGNOSIS — D649 Anemia, unspecified: Secondary | ICD-10-CM | POA: Diagnosis not present

## 2017-03-30 DIAGNOSIS — Z9842 Cataract extraction status, left eye: Secondary | ICD-10-CM | POA: Diagnosis not present

## 2017-03-30 DIAGNOSIS — Z85118 Personal history of other malignant neoplasm of bronchus and lung: Secondary | ICD-10-CM | POA: Diagnosis not present

## 2017-03-30 DIAGNOSIS — T380X5A Adverse effect of glucocorticoids and synthetic analogues, initial encounter: Secondary | ICD-10-CM | POA: Diagnosis not present

## 2017-03-30 DIAGNOSIS — Z87891 Personal history of nicotine dependence: Secondary | ICD-10-CM | POA: Diagnosis not present

## 2017-03-30 DIAGNOSIS — I129 Hypertensive chronic kidney disease with stage 1 through stage 4 chronic kidney disease, or unspecified chronic kidney disease: Secondary | ICD-10-CM | POA: Diagnosis present

## 2017-03-30 DIAGNOSIS — J9621 Acute and chronic respiratory failure with hypoxia: Secondary | ICD-10-CM | POA: Diagnosis present

## 2017-03-30 DIAGNOSIS — H3530 Unspecified macular degeneration: Secondary | ICD-10-CM | POA: Diagnosis present

## 2017-03-30 DIAGNOSIS — J441 Chronic obstructive pulmonary disease with (acute) exacerbation: Secondary | ICD-10-CM | POA: Diagnosis not present

## 2017-03-30 DIAGNOSIS — R7989 Other specified abnormal findings of blood chemistry: Secondary | ICD-10-CM

## 2017-03-30 DIAGNOSIS — J449 Chronic obstructive pulmonary disease, unspecified: Secondary | ICD-10-CM

## 2017-03-30 DIAGNOSIS — D638 Anemia in other chronic diseases classified elsewhere: Secondary | ICD-10-CM | POA: Diagnosis present

## 2017-03-30 LAB — COMPREHENSIVE METABOLIC PANEL
ALK PHOS: 83 U/L (ref 38–126)
ALT: 36 U/L (ref 14–54)
ANION GAP: 7 (ref 5–15)
AST: 34 U/L (ref 15–41)
Albumin: 3.4 g/dL — ABNORMAL LOW (ref 3.5–5.0)
BILIRUBIN TOTAL: 0.7 mg/dL (ref 0.3–1.2)
BUN: 27 mg/dL — ABNORMAL HIGH (ref 6–20)
CALCIUM: 8.7 mg/dL — AB (ref 8.9–10.3)
CO2: 28 mmol/L (ref 22–32)
CREATININE: 1.77 mg/dL — AB (ref 0.44–1.00)
Chloride: 101 mmol/L (ref 101–111)
GFR, EST AFRICAN AMERICAN: 31 mL/min — AB (ref >60)
GFR, EST NON AFRICAN AMERICAN: 27 mL/min — AB (ref >60)
Glucose, Bld: 194 mg/dL — ABNORMAL HIGH (ref 65–99)
Potassium: 4.4 mmol/L (ref 3.5–5.1)
Sodium: 136 mmol/L (ref 135–145)
TOTAL PROTEIN: 7.4 g/dL (ref 6.5–8.1)

## 2017-03-30 LAB — CBC WITH DIFFERENTIAL/PLATELET
BASOS ABS: 0 10*3/uL (ref 0–0.1)
BASOS PCT: 0 %
EOS ABS: 0.1 10*3/uL (ref 0–0.7)
Eosinophils Relative: 2 %
HEMATOCRIT: 26.7 % — AB (ref 35.0–47.0)
HEMOGLOBIN: 8.9 g/dL — AB (ref 12.0–16.0)
Lymphocytes Relative: 8 %
Lymphs Abs: 0.4 10*3/uL — ABNORMAL LOW (ref 1.0–3.6)
MCH: 31.4 pg (ref 26.0–34.0)
MCHC: 33.5 g/dL (ref 32.0–36.0)
MCV: 93.8 fL (ref 80.0–100.0)
MONOS PCT: 11 %
Monocytes Absolute: 0.5 10*3/uL (ref 0.2–0.9)
NEUTROS ABS: 3.9 10*3/uL (ref 1.4–6.5)
NEUTROS PCT: 79 %
Platelets: 278 10*3/uL (ref 150–440)
RBC: 2.85 MIL/uL — ABNORMAL LOW (ref 3.80–5.20)
RDW: 14.7 % — ABNORMAL HIGH (ref 11.5–14.5)
WBC: 5 10*3/uL (ref 3.6–11.0)

## 2017-03-30 LAB — LACTIC ACID, PLASMA
Lactic Acid, Venous: 1.4 mmol/L (ref 0.5–1.9)
Lactic Acid, Venous: 1.4 mmol/L (ref 0.5–1.9)

## 2017-03-30 LAB — GLUCOSE, CAPILLARY: GLUCOSE-CAPILLARY: 287 mg/dL — AB (ref 65–99)

## 2017-03-30 LAB — FIBRIN DERIVATIVES D-DIMER (ARMC ONLY): FIBRIN DERIVATIVES D-DIMER (ARMC): 1459.33 ng{FEU}/mL — AB (ref 0.00–499.00)

## 2017-03-30 LAB — TROPONIN I: Troponin I: <0.03 ng/mL (ref <0.03)

## 2017-03-30 LAB — INFLUENZA PANEL BY PCR (TYPE A & B)
Influenza A By PCR: NEGATIVE
Influenza B By PCR: NEGATIVE

## 2017-03-30 LAB — BRAIN NATRIURETIC PEPTIDE: B NATRIURETIC PEPTIDE 5: 37 pg/mL (ref 0.0–100.0)

## 2017-03-30 LAB — MAGNESIUM: Magnesium: 2.2 mg/dL (ref 1.7–2.4)

## 2017-03-30 MED ORDER — BISACODYL 5 MG PO TBEC: 5.0000 mg | DELAYED_RELEASE_TABLET | Freq: Every day | ORAL | Status: DC | PRN

## 2017-03-30 MED ORDER — ACETAMINOPHEN 650 MG RE SUPP: 650.0000 mg | Freq: Four times a day (QID) | RECTAL | Status: DC | PRN

## 2017-03-30 MED ORDER — ONDANSETRON HCL 4 MG PO TABS: 4.0000 mg | ORAL_TABLET | Freq: Four times a day (QID) | ORAL | Status: DC | PRN

## 2017-03-30 MED ORDER — HEPARIN SODIUM (PORCINE) 5000 UNIT/ML IJ SOLN
5000.0000 [IU] | Freq: Three times a day (TID) | INTRAMUSCULAR | Status: DC
  Administered 2017-03-30 – 2017-04-03 (×10): 5000 [IU] via SUBCUTANEOUS
  Filled 2017-03-30 (×9): qty 1

## 2017-03-30 MED ORDER — ALBUTEROL SULFATE (2.5 MG/3ML) 0.083% IN NEBU
2.5000 mg | INHALATION_SOLUTION | Freq: Four times a day (QID) | RESPIRATORY_TRACT | Status: DC
  Administered 2017-03-30 – 2017-04-03 (×15): 2.5 mg via RESPIRATORY_TRACT
  Filled 2017-03-30 (×15): qty 3

## 2017-03-30 MED ORDER — INSULIN ASPART 100 UNIT/ML ~~LOC~~ SOLN
0.0000 [IU] | Freq: Every day | SUBCUTANEOUS | Status: DC
  Administered 2017-03-30: 21:00:00 3 [IU] via SUBCUTANEOUS
  Administered 2017-03-31: 4 [IU] via SUBCUTANEOUS
  Filled 2017-03-30 (×2): qty 1

## 2017-03-30 MED ORDER — IPRATROPIUM-ALBUTEROL 0.5-2.5 (3) MG/3ML IN SOLN
3.0000 mL | Freq: Once | RESPIRATORY_TRACT | Status: AC
  Administered 2017-03-30: 3 mL via RESPIRATORY_TRACT
  Filled 2017-03-30: qty 3

## 2017-03-30 MED ORDER — ORAL CARE MOUTH RINSE
15.0000 mL | Freq: Two times a day (BID) | OROMUCOSAL | Status: DC
  Administered 2017-03-30 – 2017-04-01 (×5): 15 mL via OROMUCOSAL

## 2017-03-30 MED ORDER — METOPROLOL TARTRATE 25 MG PO TABS
25.0000 mg | ORAL_TABLET | Freq: Two times a day (BID) | ORAL | Status: DC
  Administered 2017-03-30 – 2017-04-03 (×8): 25 mg via ORAL
  Filled 2017-03-30 (×8): qty 1

## 2017-03-30 MED ORDER — SODIUM CHLORIDE 0.9% FLUSH
3.0000 mL | Freq: Two times a day (BID) | INTRAVENOUS | Status: DC
  Administered 2017-03-30 – 2017-04-03 (×8): 3 mL via INTRAVENOUS

## 2017-03-30 MED ORDER — ACETAMINOPHEN 325 MG PO TABS: 650.0000 mg | ORAL_TABLET | Freq: Four times a day (QID) | ORAL | Status: DC | PRN

## 2017-03-30 MED ORDER — MOMETASONE FURO-FORMOTEROL FUM 200-5 MCG/ACT IN AERO
2.0000 | INHALATION_SPRAY | Freq: Two times a day (BID) | RESPIRATORY_TRACT | Status: DC
  Administered 2017-03-30 – 2017-03-31 (×2): 2 via RESPIRATORY_TRACT
  Filled 2017-03-30: qty 8.8

## 2017-03-30 MED ORDER — PANTOPRAZOLE SODIUM 40 MG PO TBEC
40.0000 mg | DELAYED_RELEASE_TABLET | Freq: Every day | ORAL | Status: DC
  Administered 2017-03-31 – 2017-04-03 (×4): 40 mg via ORAL
  Filled 2017-03-30 (×4): qty 1

## 2017-03-30 MED ORDER — ISOSORBIDE MONONITRATE ER 30 MG PO TB24
30.0000 mg | ORAL_TABLET | Freq: Every day | ORAL | Status: DC
  Administered 2017-03-31 – 2017-04-03 (×4): 30 mg via ORAL
  Filled 2017-03-30 (×4): qty 1

## 2017-03-30 MED ORDER — INSULIN ASPART 100 UNIT/ML ~~LOC~~ SOLN
0.0000 [IU] | Freq: Three times a day (TID) | SUBCUTANEOUS | Status: DC
  Administered 2017-03-31: 08:00:00 9 [IU] via SUBCUTANEOUS
  Administered 2017-03-31: 17:00:00 16 [IU] via SUBCUTANEOUS
  Administered 2017-04-01: 9 [IU] via SUBCUTANEOUS
  Administered 2017-04-01: 18:00:00 5 [IU] via SUBCUTANEOUS
  Administered 2017-04-01: 08:00:00 3 [IU] via SUBCUTANEOUS
  Filled 2017-03-30 (×4): qty 1

## 2017-03-30 MED ORDER — HYDROCODONE-ACETAMINOPHEN 5-325 MG PO TABS: 1.0000 | ORAL_TABLET | ORAL | Status: DC | PRN

## 2017-03-30 MED ORDER — SODIUM CHLORIDE 0.9 % IV SOLN: 250.0000 mL | INTRAVENOUS | Status: DC | PRN

## 2017-03-30 MED ORDER — SODIUM CHLORIDE 0.9 % IV SOLN
INTRAVENOUS | Status: DC
  Administered 2017-03-30: 20:00:00 via INTRAVENOUS

## 2017-03-30 MED ORDER — ATORVASTATIN CALCIUM 20 MG PO TABS
20.0000 mg | ORAL_TABLET | Freq: Every day | ORAL | Status: DC
  Administered 2017-03-30 – 2017-04-02 (×4): 20 mg via ORAL
  Filled 2017-03-30 (×4): qty 1

## 2017-03-30 MED ORDER — GABAPENTIN 600 MG PO TABS
300.0000 mg | ORAL_TABLET | Freq: Two times a day (BID) | ORAL | Status: DC
  Administered 2017-03-30 – 2017-04-01 (×4): 300 mg via ORAL
  Filled 2017-03-30 (×5): qty 1

## 2017-03-30 MED ORDER — GUAIFENESIN-DM 100-10 MG/5ML PO SYRP
5.0000 mL | ORAL_SOLUTION | ORAL | Status: DC | PRN
Start: 2017-03-30 — End: 2017-04-03
  Administered 2017-03-31 (×3): 5 mL via ORAL
  Filled 2017-03-30 (×6): qty 5

## 2017-03-30 MED ORDER — MAGNESIUM OXIDE 400 (241.3 MG) MG PO TABS
400.0000 mg | ORAL_TABLET | Freq: Two times a day (BID) | ORAL | Status: DC
  Administered 2017-03-30 – 2017-04-03 (×8): 400 mg via ORAL
  Filled 2017-03-30 (×8): qty 1

## 2017-03-30 MED ORDER — DIAZEPAM 5 MG PO TABS: 5.0000 mg | ORAL_TABLET | Freq: Two times a day (BID) | ORAL | Status: DC | PRN

## 2017-03-30 MED ORDER — SODIUM CHLORIDE 0.9 % IV BOLUS (SEPSIS)
1000.0000 mL | Freq: Once | INTRAVENOUS | Status: AC
  Administered 2017-03-30: 1000 mL via INTRAVENOUS

## 2017-03-30 MED ORDER — ALBUTEROL SULFATE (2.5 MG/3ML) 0.083% IN NEBU: 2.5000 mg | INHALATION_SOLUTION | RESPIRATORY_TRACT | Status: DC | PRN

## 2017-03-30 MED ORDER — NITROGLYCERIN 0.4 MG SL SUBL: 0.4000 mg | SUBLINGUAL_TABLET | SUBLINGUAL | Status: DC | PRN

## 2017-03-30 MED ORDER — SENNOSIDES-DOCUSATE SODIUM 8.6-50 MG PO TABS: 1.0000 | ORAL_TABLET | Freq: Every evening | ORAL | Status: DC | PRN

## 2017-03-30 MED ORDER — ONDANSETRON HCL 4 MG/2ML IJ SOLN: 4.0000 mg | Freq: Four times a day (QID) | INTRAMUSCULAR | Status: DC | PRN

## 2017-03-30 MED ORDER — METHYLPREDNISOLONE SODIUM SUCC 40 MG IJ SOLR
40.0000 mg | Freq: Two times a day (BID) | INTRAMUSCULAR | Status: DC
  Administered 2017-03-30 – 2017-03-31 (×2): 40 mg via INTRAVENOUS
  Filled 2017-03-30 (×2): qty 1

## 2017-03-30 MED ORDER — SODIUM CHLORIDE 0.9% FLUSH: 3.0000 mL | INTRAVENOUS | Status: DC | PRN

## 2017-03-30 NOTE — Progress Notes (Signed)
   03/30/17 2100  Clinical Encounter Type  Visited With Patient  Visit Type Initial;Spiritual support  Referral From Nurse  Spiritual Encounters  Spiritual Needs Emotional;Prayer  [facility] met with patient at bedside; [place] talked and prayed with patient; spiritual and emotional support offered.

## 2017-03-30 NOTE — ED Triage Notes (Signed)
Arrives with c/o cough and congestion x 2-3 days.  Patient states she has had pneumonia in the past and symptoms are similar.

## 2017-03-30 NOTE — H&P (Addendum)
[facility] [facility] NAME: [name] [name]    MR#:  [id number]  DATE OF BIRTH:  03-May-1940  DATE OF ADMISSION:  03/30/2017  PRIMARY CARE PHYSICIAN: Jerrol Banana., MD   REQUESTING/REFERRING PHYSICIAN: Nena Polio, MD  CHIEF COMPLAINT:   Chief Complaint  Patient presents with  . Cough   Cough, wheezing and shortness of breath for 3 days.  Generalized weakness and fall for 2 weeks. HISTORY OF PRESENT ILLNESS:  [name] [name]  is a 76 y.o. female with a known history of multiple medical problems as below.  The patient presented in the ED with above chief complaint.  She denies any fever or chills, no nausea, vomiting or diarrhea.  She said she fell 4 times for the past 2 weeks.  She uses oxygen at night.  Chest x-ray did not show any pneumonia.  D-dimer is elevated.  PAST MEDICAL HISTORY:   Past Medical History:  Diagnosis Date  . Abnormal CT lung screening 10/17/2015  . COPD (chronic obstructive pulmonary disease) ([place])   . Coronary artery disease, non-occlusive    a. cath 2006: min nonobs CAD; b. cath 12/2010: cath LAD 50%, RCA 60%; c. 08/2013: Minimal luminal irregs, right dominant system with no significant CAD, diffuse luminal irregs noted. Normal EF 55%, no AS or MS.   . Diabetes mellitus   . Hyperlipemia    Followed by Dr. Rosanna Randy  . Hypertension   . Lung cancer ([place])   . Macular degeneration    rt  . Personal history of tobacco use, presenting hazards to health 10/15/2015  . Pneumonia    hx  . Shortness of breath   . Stroke [facility])     PAST SURGICAL HISTORY:   Past Surgical History:  Procedure Laterality Date  . ABDOMINAL HYSTERECTOMY    . ANTERIOR CERVICAL DECOMP/DISCECTOMY FUSION N/A 10/19/2013   Procedure: CERVICAL FIVE-SIX ANTERIOR CERVICAL DECOMPRESSION WITH FUSION INTERBODY PROSTHESIS PLATING AND PEEK CAGE;  Surgeon: Ophelia Charter, MD;  Location: [facility];  Service: Neurosurgery;  Laterality: N/A;  .  BACK SURGERY  80's  . BREAST CYST EXCISION Left    left negative   . CARDIAC CATHETERIZATION  05/2004  . CATARACT EXTRACTION Left   . CHOLECYSTECTOMY    . ENDARTERECTOMY Left 10/24/2014   Procedure: ENDARTERECTOMY CAROTID;  Surgeon: Algernon Huxley, MD;  Location: [facility] ORS;  Service: Vascular;  Laterality: Left;  . ENDOBRONCHIAL ULTRASOUND N/A 11/14/2015   Procedure: ENDOBRONCHIAL ULTRASOUND;  Surgeon: [name], MD;  Location: [facility] ORS;  Service: Cardiopulmonary;  Laterality: N/A;  . PERIPHERAL VASCULAR CATHETERIZATION N/A 12/04/2015   Procedure: Glori Luis Cath Insertion;  Surgeon: Algernon Huxley, MD;  Location: [facility];  Service: Cardiovascular;  Laterality: N/A;  . TONSILLECTOMY AND ADENOIDECTOMY    . VESICOVAGINAL FISTULA CLOSURE W/ TAH      SOCIAL HISTORY:   Social History   Tobacco Use  . Smoking status: Former Smoker    Packs/day: 1.00    Years: 50.00    Pack years: 50.00    Types: Cigarettes    Last attempt to quit: 10/03/2015    Years since quitting: 1.4  . Smokeless tobacco: Never Used  . Tobacco comment: smokes 3 cigs daily 05/06/15. Pt instructed to quit.  Substance Use Topics  . Alcohol use: No    Alcohol/week: 0.0 oz    FAMILY HISTORY:   Family History  Problem Relation Age of Onset  .  Stroke Mother        Massive  . Heart attack Father        Massive  . Heart attack Brother   . Heart attack Brother   . Lung cancer Maternal Grandfather   . Heart attack Paternal Grandmother        MI    DRUG ALLERGIES:   Allergies  Allergen Reactions  . Coconut Fatty Acids Swelling    Throat swells    REVIEW OF SYSTEMS:   Review of Systems  Constitutional: Positive for malaise/fatigue. Negative for chills and fever.  HENT: Negative for sore throat.   Eyes: Negative for blurred vision and double vision.  Respiratory: Positive for cough, shortness of breath and wheezing. Negative for hemoptysis, sputum production and stridor.   Cardiovascular: Negative for  chest pain, palpitations, orthopnea and leg swelling.  Gastrointestinal: Negative for abdominal pain, blood in stool, diarrhea, melena, nausea and vomiting.  Genitourinary: Negative for dysuria, flank pain and hematuria.  Musculoskeletal: Negative for back pain and joint pain.  Skin: Negative for rash.  Neurological: Positive for weakness. Negative for dizziness, sensory change, focal weakness, seizures, loss of consciousness and headaches.  Endo/Heme/Allergies: Negative for polydipsia.  Psychiatric/Behavioral: Negative for depression. The patient is not nervous/anxious.     MEDICATIONS AT HOME:   Prior to Admission medications   Medication Sig Start Date End Date Taking? Authorizing Provider  albuterol (PROVENTIL) (5 MG/ML) 0.5% nebulizer solution Take 0.5 mLs (2.5 mg total) by nebulization 2 (two) times daily. 07/30/16  Yes Jerrol Banana., MD  atorvastatin (LIPITOR) 40 MG tablet Take 0.5 tablets (20 mg total) by mouth at bedtime. 11/02/16  Yes [name], MD  budesonide-formoterol (SYMBICORT) 160-4.5 MCG/ACT inhaler Inhale 2 puffs into the lungs 2 (two) times daily. 12/06/14  Yes [name], MD  diazepam (VALIUM) 5 MG tablet Take 1 tablet (5 mg total) by mouth every 12 (twelve) hours as needed for muscle spasms. 12/29/16  Yes Jerrol Banana., MD  gabapentin (NEURONTIN) 600 MG tablet Take 0.5 tablets (300 mg total) by mouth 2 (two) times daily. 07/16/16  Yes Gladstone Lighter, MD  glimepiride (AMARYL) 2 MG tablet Take 1 tablet (2 mg total) by mouth 2 (two) times daily. 06/01/16  Yes Jerrol Banana., MD  HYDROcodone-acetaminophen [facility]) 5-325 MG tablet Take 1 tablet by mouth every 6 (six) hours as needed for moderate pain or severe pain. 07/16/16  Yes Gladstone Lighter, MD  isosorbide mononitrate (IMDUR) 30 MG 24 hr tablet TAKE 1 TABLET BY MOUTH  DAILY 12/15/16  Yes Gollan, Kathlene November, MD  magnesium oxide (MAG-OX) 400 (241.3 MG) MG tablet Take 1 tablet by mouth 2 (two)  times daily. 08/04/13  Yes [provider]  metoprolol tartrate (LOPRESSOR) 25 MG tablet Take 1 tablet (25 mg total) by mouth 2 (two) times daily. 07/30/16  Yes Jerrol Banana., MD  mometasone (ELOCON) 0.1 % cream Apply small amount nightly to each ear 12/29/16  Yes Jerrol Banana., MD  Multiple Vitamins-Minerals (ICAPS) CAPS Take 2 capsules by mouth daily. Reported on 04/17/2015   Yes [provider]  nitroGLYCERIN (NITROSTAT) 0.4 MG SL tablet Place 1 tablet (0.4 mg total) under the tongue every 5 (five) minutes as needed for chest pain. 07/23/14  Yes Gollan, Kathlene November, MD  nystatin cream (MYCOSTATIN) Apply 1 application topically 2 (two) times daily. 10/08/16  Yes Carles Collet M, PA-C  ondansetron (ZOFRAN) 8 MG tablet Take 1 tablet (8 mg  total) by mouth every 8 (eight) hours as needed for nausea or vomiting. 07/17/16  Yes Mar Daring, PA-C  pantoprazole (PROTONIX) 40 MG tablet TAKE 1 TABLET BY MOUTH  DAILY 10/28/16  Yes Jerrol Banana., MD  isosorbide mononitrate (IMDUR) 30 MG 24 hr tablet TAKE 1 TABLET BY MOUTH  DAILY Patient not taking: Reported on 03/30/2017 03/22/17   [name], MD  ONE TOUCH ULTRA TEST test strip USE TO CHECK BLOOD SUGAR ONCE DAILY 07/18/16   Jerrol Banana., MD  triamcinolone cream (KENALOG) 0.1 % Apply 1 application topically 2 (two) times daily. Patient not taking: Reported on 03/30/2017 10/08/16   Trinna Post, PA-C      VITAL SIGNS:  Blood pressure (!) 145/65, pulse 73, temperature (!) 97.1 F (36.2 C), temperature source Oral, resp. rate 16, height 5\' 4"  (1.626 m), weight 146 lb (66.2 kg), SpO2 100 %.  PHYSICAL EXAMINATION:  Physical Exam  GENERAL:  76 y.o.-year-old patient lying in the bed with no acute distress.  EYES: Pupils equal, round, reactive to light and accommodation. No scleral icterus. Extraocular muscles intact.  HEENT: Head atraumatic, normocephalic. Oropharynx and nasopharynx clear.  NECK:   Supple, no jugular venous distention. No thyroid enlargement, no tenderness.  LUNGS: Normal breath sounds bilaterally, has expiratory wheezing, no rales,rhonchi or crepitation. No use of accessory muscles of respiration.  CARDIOVASCULAR: S1, S2 normal. No murmurs, rubs, or gallops.  ABDOMEN: Soft, nontender, nondistended. Bowel sounds present. No organomegaly or mass.  EXTREMITIES: No pedal edema, cyanosis, or clubbing.  Positive bilateral calf tenderness. NEUROLOGIC: Cranial nerves II through XII are intact. Muscle strength 4/5 in all extremities. Sensation intact. Gait not checked.  PSYCHIATRIC: The patient is alert and oriented x 3.  SKIN: No obvious rash, lesion, or ulcer.   LABORATORY PANEL:   CBC Recent Labs  Lab 03/30/17 1342  WBC 5.0  HGB 8.9*  HCT 26.7*  PLT 278   ------------------------------------------------------------------------------------------------------------------  Chemistries  Recent Labs  Lab 03/30/17 1342  NA 136  K 4.4  CL 101  CO2 28  GLUCOSE 194*  BUN 27*  CREATININE 1.77*  CALCIUM 8.7*  AST 34  ALT 36  ALKPHOS 83  BILITOT 0.7   ------------------------------------------------------------------------------------------------------------------  Cardiac Enzymes Recent Labs  Lab 03/30/17 1342  TROPONINI <0.03   ------------------------------------------------------------------------------------------------------------------  RADIOLOGY:  Dg Chest 2 View  Result Date: 03/30/2017 CLINICAL DATA:  Patient with a history of COPD and lung cancer. Cough and congestion for 2-3 days. EXAM: CHEST  2 VIEW COMPARISON:  CT chest 03/17/2017. FINDINGS: The lungs are emphysematous but clear. No pneumothorax or pleural effusion. Heart size is normal. Port-A-Cath is in place. No acute bony abnormality. IMPRESSION: Emphysema without acute disease. Electronically Signed   By: Inge Rise M.D.   On: 03/30/2017 13:39   Ct Head Wo Contrast  Result Date:  03/30/2017 CLINICAL DATA:  Multiple falls over the last 3 weeks.  Somnolent. EXAM: CT HEAD WITHOUT CONTRAST TECHNIQUE: Contiguous axial images were obtained from the base of the skull through the vertex without intravenous contrast. COMPARISON:  08/29/2014 FINDINGS: Brain: No evidence of acute infarction, hemorrhage, hydrocephalus, extra-axial collection or mass lesion/mass effect. There is ventricular and sulcal enlargement reflecting mild diffuse atrophy. There is mild periventricular white matter hypoattenuation consistent with chronic microvascular ischemic change. These findings have developed since the prior head CT. Vascular: No hyperdense vessel or unexpected calcification. Skull: Normal. Negative for fracture or focal lesion. Sinuses/Orbits: Visualize globes and orbits are unremarkable.  There is mild mucosal thickening and a small amount of dependent fluid in the right sphenoid sinus. Remaining visualized sinuses and mastoid air cells are clear. Other: None. IMPRESSION: 1. No acute intracranial abnormalities. 2. Mild generalized atrophy and chronic microvascular ischemic change, new since the prior head CT. Electronically Signed   By: Lajean Manes M.D.   On: 03/30/2017 15:17      IMPRESSION AND PLAN:   Acute respiratory failure with hypoxia due to COPD exacerbation. Patient will be admitted to medical floor. Start IV Solu-Medrol, DuoNeb and home nebulizer. Continue oxygen by nasal cannula.  Elevated d-dimer.  Check venous duplex.  ARF on CKD stage III.  IV fluid support.  Follow up BMP.  History of lung cancer.  Diabetes.  Start sliding scale.  Anemia of chronic disease.  Stable.  Generalized weakness and fall.  PT evaluation.  All the records are reviewed and case discussed with ED provider. Management plans discussed with the patient, her husband and they are in agreement.  CODE STATUS: Full Code  TOTAL TIME TAKING CARE OF THIS PATIENT: 56 minutes.    Demetrios Loll M.D on  03/30/2017 at 6:22 PM  Between 7am to 6pm - Pager - [phone]  After 6pm go to [email or web address] - password [email or web address] [facility]  [facility]  Office  [phone]  CC: Primary care physician; Jerrol Banana., MD   Note: This dictation was prepared with Dragon dictation along with smaller phrase technology. Any transcriptional errors that result from this process are unin

## 2017-03-30 NOTE — ED Notes (Signed)
Pt still eating. Will take breathing treatment when done.

## 2017-03-30 NOTE — ED Notes (Signed)
ED Provider at bedside. 

## 2017-03-30 NOTE — ED Notes (Signed)
Pt placed on 2L [place] d/t low O2 sat by this RN. Will continue to monitor.

## 2017-03-30 NOTE — ED Notes (Signed)
Pt eating. Refused duoneb at this time. Will take it when she is done eating.

## 2017-03-30 NOTE — ED Notes (Signed)
Attempted to call report

## 2017-03-30 NOTE — ED Provider Notes (Addendum)
[facility] Emergency Department Provider Note   ____________________________________________   First MD Initiated Contact with Patient 03/30/17 1303     (approximate)  I have reviewed the triage vital signs and the nursing notes.   HISTORY  Chief Complaint Cough  HPI [name] [name] is a 76 y.o. female reports she has had several days worth of a dry cough.  She has been feeling weak and cold for about 2 weeks and has been falling more than usual.  She had a shuffling gait and came to the emergency room had physical therapy got better but now has deteriorated back into a shuffling gait again.  Patient has no incontinence.  Patient seems to be getting a little more forgetful.  100 this morning.  She is worried she might be getting a pneumonia.   Past Medical History:  Diagnosis Date  . Abnormal CT lung screening 10/17/2015  . COPD (chronic obstructive pulmonary disease) ([place])   . Coronary artery disease, non-occlusive    a. cath 2006: min nonobs CAD; b. cath 12/2010: cath LAD 50%, RCA 60%; c. 08/2013: Minimal luminal irregs, right dominant system with no significant CAD, diffuse luminal irregs noted. Normal EF 55%, no AS or MS.   . Diabetes mellitus   . Hyperlipemia    Followed by Dr. Rosanna Randy  . Hypertension   . Lung cancer ([place])   . Macular degeneration    rt  . Personal history of tobacco use, presenting hazards to health 10/15/2015  . Pneumonia    hx  . Shortness of breath   . Stroke [facility])     Patient Active Problem List   Diagnosis Date Noted  . AKI (acute kidney injury) ([employer]) 07/15/2016  . Protein-calorie malnutrition, severe 02/08/2016  . Acute renal failure (ARF) ([place]) 02/05/2016  . Primary cancer of right upper lobe of lung ([place]) 11/20/2015  . Adenopathy   . Abnormal CT lung screening 10/17/2015  . Personal history of tobacco use, presenting hazards to health 10/15/2015  . NSTEMI (non-ST elevated myocardial infarction) ([place]) 01/27/2015  .  Chronic vulvitis 09/26/2014  . Allergic reaction 09/26/2014  . Carotid stenosis 08/30/2014  . Cervical nerve root disorder 08/10/2014  . Absolute anemia 08/10/2014  . CAD in native artery 08/10/2014  . B12 deficiency 08/10/2014  . Back ache 08/10/2014  . Bronchitis, chronic ([place]) 08/10/2014  . Diabetes mellitus with polyneuropathy ([place]) 08/10/2014  . Can't get food down 08/10/2014  . Eczema of external ear 08/10/2014  . Accumulation of fluid in tissues 08/10/2014  . Gout 08/10/2014  . Adult hypothyroidism 08/10/2014  . Mononeuritis 08/10/2014  . Muscle ache 08/10/2014  . Disorder of peripheral nervous system 08/10/2014  . Lesion of vulva 08/10/2014  . Cervical spondylosis with radiculopathy 10/19/2013  . Unstable angina ([place]) 08/15/2013  . COPD exacerbation ([place]) 03/29/2013  . CAD (coronary artery disease) 06/22/2011  . COPD (chronic obstructive pulmonary disease) with emphysema ([place]) 03/07/2010  . CHEST PAIN UNSPECIFIED 07/22/2009  . HLD (hyperlipidemia) 04/01/2009  . Malaise and fatigue 04/01/2009  . Hyperlipidemia 01/18/2009  . TOBACCO ABUSE 01/18/2009  . HYPERTENSION, BENIGN 01/18/2009  . CLAUDICATION 01/18/2009  . Pain in limb 01/18/2009  . CAFL (chronic airflow limitation) ([place]) 01/20/2007  . Late effects of cerebrovascular disease 01/10/2007  . Essential (primary) hypertension 12/22/2006    Past Surgical History:  Procedure Laterality Date  . ABDOMINAL HYSTERECTOMY    . ANTERIOR CERVICAL DECOMP/DISCECTOMY FUSION N/A 10/19/2013   Procedure: CERVICAL FIVE-SIX ANTERIOR CERVICAL DECOMPRESSION WITH  FUSION INTERBODY PROSTHESIS PLATING AND PEEK CAGE;  Surgeon: Ophelia Charter, MD;  Location: [place] [facility] OR[facility];  Service: Neurosurgery;  Laterality: N/A;  . BACK SURGERY  80's  . BREAST CYST EXCISION Left    left negative   . CARDIAC CATHETERIZATION  05/2004  . CATARACT EXTRACTION Left   . CHOLECYSTECTOMY    . ENDARTERECTOMY Left 10/24/2014   Procedure: ENDARTERECTOMY CAROTID;   Surgeon: Algernon Huxley, MD;  Location: [facility] ORS;  Service: Vascular;  Laterality: Left;  . ENDOBRONCHIAL ULTRASOUND N/A 11/14/2015   Procedure: ENDOBRONCHIAL ULTRASOUND;  Surgeon: [name], MD;  Location: [facility] ORS;  Service: Cardiopulmonary;  Laterality: N/A;  . PERIPHERAL VASCULAR CATHETERIZATION N/A 12/04/2015   Procedure: Glori Luis Cath Insertion;  Surgeon: Algernon Huxley, MD;  Location: [place] LAB;  Service: Cardiovascular;  Laterality: N/A;  . TONSILLECTOMY AND ADENOIDECTOMY    . VESICOVAGINAL FISTULA CLOSURE W/ TAH      Prior to Admission medications   Medication Sig Start Date End Date Taking? Authorizing Provider  albuterol (PROVENTIL) (5 MG/ML) 0.5% nebulizer solution Take 0.5 mLs (2.5 mg total) by nebulization 2 (two) times daily. 07/30/16   Jerrol Banana., MD  atorvastatin (LIPITOR) 40 MG tablet Take 0.5 tablets (20 mg total) by mouth at bedtime. 11/02/16   [name], MD  budesonide-formoterol (SYMBICORT) 160-4.5 MCG/ACT inhaler Inhale 2 puffs into the lungs 2 (two) times daily. 12/06/14   [name], MD  diazepam (VALIUM) 5 MG tablet Take 1 tablet (5 mg total) by mouth every 12 (twelve) hours as needed for muscle spasms. 12/29/16   Jerrol Banana., MD  gabapentin (NEURONTIN) 600 MG tablet Take 0.5 tablets (300 mg total) by mouth 2 (two) times daily. 07/16/16   Gladstone Lighter, MD  glimepiride (AMARYL) 2 MG tablet Take 1 tablet (2 mg total) by mouth 2 (two) times daily. 06/01/16   Jerrol Banana., MD  HYDROcodone-acetaminophen [facility]) 5-325 MG tablet Take 1 tablet by mouth every 6 (six) hours as needed for moderate pain or severe pain. 07/16/16   [place]stone Lighter, MD  isosorbide mononitrate (IMDUR) 30 MG 24 hr tablet TAKE 1 TABLET BY MOUTH  DAILY 12/15/16   [name], MD  isosorbide mononitrate (IMDUR) 30 MG 24 hr tablet TAKE 1 TABLET BY MOUTH  DAILY 03/22/17   [name], MD  magnesium oxide (MAG-OX) 400 (241.3 MG) MG tablet Take 1 tablet  by mouth 2 (two) times daily. 08/04/13   [provider]  metoprolol tartrate (LOPRESSOR) 25 MG tablet Take 1 tablet (25 mg total) by mouth 2 (two) times daily. 07/30/16   Jerrol Banana., MD  mometasone (ELOCON) 0.1 % cream Apply small amount nightly to each ear 12/29/16   Jerrol Banana., MD  Multiple Vitamins-Minerals (ICAPS) CAPS Take 2 capsules by mouth daily. Reported on 04/17/2015    [provider]  nitroGLYCERIN (NITROSTAT) 0.4 MG SL tablet Place 1 tablet (0.4 mg total) under the tongue every 5 (five) minutes as needed for chest pain. 07/23/14   [name], MD  nystatin cream (MYCOSTATIN) Apply 1 application topically 2 (two) times daily. 10/08/16   Trinna Post, PA-C  ondansetron (ZOFRAN) 8 MG tablet Take 1 tablet (8 mg total) by mouth every 8 (eight) hours as needed for nausea or vomiting. 07/17/16   Mar Daring, PA-C  ONE TOUCH ULTRA TEST test strip USE TO CHECK BLOOD SUGAR ONCE DAILY 07/18/16   Miguel Aschoff  Kaylyn Lim., MD  pantoprazole (PROTONIX) 40 MG tablet TAKE 1 TABLET BY MOUTH  DAILY 10/28/16   Jerrol Banana., MD  triamcinolone cream (KENALOG) 0.1 % Apply 1 application topically 2 (two) times daily. 10/08/16   Trinna Post, PA-C    Allergies Coconut fatty acids  Family History  Problem Relation Age of Onset  . Stroke Mother        Massive  . Heart attack Father        Massive  . Heart attack Brother   . Heart attack Brother   . Lung cancer Maternal Grandfather   . Heart attack Paternal Grandmother        MI    Social History Social History   Tobacco Use  . Smoking status: Former Smoker    Packs/day: 1.00    Years: 50.00    Pack years: 50.00    Types: Cigarettes    Last attempt to quit: 10/03/2015    Years since quitting: 1.4  . Smokeless tobacco: Never Used  . Tobacco comment: smokes 3 cigs daily 05/06/15. Pt instructed to quit.  Substance Use Topics  . Alcohol use: No    Alcohol/week: 0.0 oz  . Drug use:  No    Review of Systems  Constitutional:fever/chills Eyes: No visual changes. ENT: No sore throat. Cardiovascular: Denies chest pain. Respiratory:shortness of breath. Gastrointestinal: No abdominal pain.  No nausea, no vomiting.  No diarrhea.  No constipation. Genitourinary: Negative for dysuria. Musculoskeletal: Negative for back pain. Skin: Negative for rash. Neurological: Negative for headaches, focal weakness   ____________________________________________   PHYSICAL EXAM:  VITAL SIGNS: ED Triage Vitals  Enc Vitals Group     BP 03/30/17 1252 109/90     Pulse Rate 03/30/17 1252 (!) 115     Resp 03/30/17 1252 18     Temp 03/30/17 1252 (!) 97.1 F (36.2 C)     Temp Source 03/30/17 1252 Oral     SpO2 03/30/17 1252 96 %     Weight 03/30/17 1252 146 lb (66.2 kg)     Height 03/30/17 1252 5\' 4"  (1.626 m)     Head Circumference --      [place] Flow --      Pain Score 03/30/17 1245 0     Pain Loc --      Pain Edu? --      Excl. in [place]? --     Constitutional: Alert and oriented. Well appearing and in no acute distress. Eyes: Conjunctivae are normal.  Head: Atraumatic. Nose: No congestion/rhinnorhea. Mouth/Throat: Mucous membranes are moist.  Oropharynx non-erythematous. Neck: No stridor.   Cardiovascular: Normal rate, regular rhythm. Grossly normal heart sounds.  Good peripheral circulation. Respiratory: Somewhat increased respiratory effort.  Some retractions. Lungs scattered crackles Gastrointestinal: Soft mildly diffusely tender. No distention. No abdominal bruits. No CVA tenderness. Musculoskeletal: No lower extremity tenderness nor edema.  No joint effusions. Neurologic:  Normal speech and language. No gross focal neurologic deficits are appreciated. No gait instability. Skin:  Skin is warm, dry and intact. No rash noted. Psychiatric: Mood and affect are normal. Speech and behavior are normal.  ____________________________________________   LABS (all labs ordered  are listed, but only abnormal results are displayed)  Labs Reviewed  COMPREHENSIVE METABOLIC PANEL - Abnormal; Notable for the following components:      Result Value   Glucose, Bld 194 (*)    BUN 27 (*)    Creatinine, Ser 1.77 (*)    Calcium 8.7 (*)  Albumin 3.4 (*)    GFR calc non Af Amer 27 (*)    GFR calc Af Amer 31 (*)    All other components within normal limits  CBC WITH DIFFERENTIAL/PLATELET - Abnormal; Notable for the following components:   RBC 2.85 (*)    Hemoglobin 8.9 (*)    HCT 26.7 (*)    RDW 14.7 (*)    Lymphs Abs 0.4 (*)    All other components within normal limits  URINE CULTURE  CULTURE, BLOOD (ROUTINE X 2)  CULTURE, BLOOD (ROUTINE X 2)  BRAIN NATRIURETIC PEPTIDE  TROPONIN I  LACTIC ACID, PLASMA  LACTIC ACID, PLASMA  INFLUENZA PANEL BY PCR (TYPE A & B)  URINALYSIS, COMPLETE (UACMP) WITH MICROSCOPIC  FIBRIN DERIVATIVES D-DIMER ([facility] ONLY)   ____________________________________________  EKG  EKG read and interpreted by me shows normal sinus rhythm rate of 80 normal axis she has what appear to be 2 different P waves one looks normal and the second 1 is slightly different and it occurs occasionally early after irregular complex. ____________________________________________  RADIOLOGY T of the head shows no acute problems there has been chronic microvascular changes developed since her last CT 2 years ago.  ____________________________________________   PROCEDURES  Procedure(s) performed:   Procedures  Critical Care performed:   ____________________________________________   INITIAL IMPRESSION / ASSESSMENT AND PLAN / ED COURSE  Patient has a dry cough chest x-ray is clear patient's O2 sat drops into the 80s when she moves or sits up.  Patient uses oxygen at night.  She has a nebulizer.  She seems to get more confused and weak when she walks around at home for the last 2 weeks.  Also on the ER visit today her heart rates been down as low as 45  and up to 120.  This may be due to taking the reading from the pulse ox we have had several episodes that I have seen with the nurse where the pulse ox was running 131 the heart rate was only between 70 and 80 on the monitor.  Seems like the patient is having COPD exacerbation and is getting hypoxic with exertion that seems to be making her more tired and change in her mental status somewhat.  We will see if we can get her better.  Also her d-dimer is elevated but I cannot CT her at this point because of her kidney function being up we will give her some hydration and see if she improves that and then we can possibly CT her.     ____________________________________________   FINAL CLINICAL IMPRESSION(S) / ED DIAGNOSES  Final diagnoses:  Chronic obstructive pulmonary disease with acute exacerbation [facility])    ED Discharge Orders    None       Note:  This document was prepared using Dragon voice recognition software and may include unintentional dictation errors.    Nena Polio, MD 03/30/17 1700    Nena Polio, MD 03/30/17 [phone]

## 2017-03-31 ENCOUNTER — Inpatient Hospital Stay: Payer: Medicare Other

## 2017-03-31 DIAGNOSIS — E440 Moderate protein-calorie malnutrition: Secondary | ICD-10-CM

## 2017-03-31 LAB — GLUCOSE, CAPILLARY
GLUCOSE-CAPILLARY: 475 mg/dL — AB (ref 65–99)
GLUCOSE-CAPILLARY: 483 mg/dL — AB (ref 65–99)
GLUCOSE-CAPILLARY: 442 mg/dL — AB (ref 65–99)
GLUCOSE-CAPILLARY: 329 mg/dL — AB (ref 65–99)
Glucose-Capillary: 363 mg/dL — ABNORMAL HIGH (ref 65–99)
Glucose-Capillary: 430 mg/dL — ABNORMAL HIGH (ref 65–99)

## 2017-03-31 LAB — BASIC METABOLIC PANEL
ANION GAP: 6 (ref 5–15)
BUN: 25 mg/dL — ABNORMAL HIGH (ref 6–20)
CO2: 26 mmol/L (ref 22–32)
Calcium: 8.1 mg/dL — ABNORMAL LOW (ref 8.9–10.3)
Chloride: 103 mmol/L (ref 101–111)
Creatinine, Ser: 1.39 mg/dL — ABNORMAL HIGH (ref 0.44–1.00)
GFR, EST AFRICAN AMERICAN: 42 mL/min — AB (ref >60)
GFR, EST NON AFRICAN AMERICAN: 36 mL/min — AB (ref >60)
Glucose, Bld: 314 mg/dL — ABNORMAL HIGH (ref 65–99)
POTASSIUM: 4.4 mmol/L (ref 3.5–5.1)
SODIUM: 135 mmol/L (ref 135–145)

## 2017-03-31 LAB — CBC
HEMATOCRIT: 22.7 % — AB (ref 35.0–47.0)
HEMOGLOBIN: 7.6 g/dL — AB (ref 12.0–16.0)
MCH: 31.3 pg (ref 26.0–34.0)
MCHC: 33.5 g/dL (ref 32.0–36.0)
MCV: 93.3 fL (ref 80.0–100.0)
Platelets: 231 10*3/uL (ref 150–440)
RBC: 2.43 MIL/uL — ABNORMAL LOW (ref 3.80–5.20)
RDW: 14.6 % — ABNORMAL HIGH (ref 11.5–14.5)
WBC: 4 10*3/uL (ref 3.6–11.0)

## 2017-03-31 LAB — FERRITIN: Ferritin: 407 ng/mL — ABNORMAL HIGH (ref 11–307)

## 2017-03-31 MED ORDER — INSULIN ASPART 100 UNIT/ML ~~LOC~~ SOLN
SUBCUTANEOUS | Status: AC
  Administered 2017-03-31: 16 [IU]
  Filled 2017-03-31: qty 1

## 2017-03-31 MED ORDER — INSULIN ASPART 100 UNIT/ML ~~LOC~~ SOLN
16.0000 [IU] | Freq: Once | SUBCUTANEOUS | Status: AC
  Administered 2017-03-31: 13:00:00 16 [IU] via SUBCUTANEOUS
  Filled 2017-03-31: qty 1

## 2017-03-31 MED ORDER — INSULIN GLARGINE 100 UNIT/ML ~~LOC~~ SOLN
12.0000 [IU] | Freq: Every day | SUBCUTANEOUS | Status: DC
  Administered 2017-03-31 – 2017-04-01 (×2): 12 [IU] via SUBCUTANEOUS
  Filled 2017-03-31 (×2): qty 0.12

## 2017-03-31 MED ORDER — METHYLPREDNISOLONE SODIUM SUCC 40 MG IJ SOLR
40.0000 mg | Freq: Every day | INTRAMUSCULAR | Status: DC
  Administered 2017-04-01 – 2017-04-02 (×2): 40 mg via INTRAVENOUS
  Filled 2017-03-31 (×2): qty 1

## 2017-03-31 MED ORDER — INSULIN ASPART 100 UNIT/ML ~~LOC~~ SOLN
3.0000 [IU] | Freq: Three times a day (TID) | SUBCUTANEOUS | Status: DC
  Administered 2017-03-31 – 2017-04-01 (×3): 3 [IU] via SUBCUTANEOUS
  Filled 2017-03-31 (×3): qty 1

## 2017-03-31 MED ORDER — BUDESONIDE 0.5 MG/2ML IN SUSP
0.5000 mg | Freq: Two times a day (BID) | RESPIRATORY_TRACT | Status: DC
  Administered 2017-03-31 – 2017-04-03 (×7): 0.5 mg via RESPIRATORY_TRACT
  Filled 2017-03-31 (×7): qty 2

## 2017-03-31 NOTE — Progress Notes (Signed)
Patient ID: [name] [name], female   DOB: 04/29/1940, 76 y.o.   MRN: [id number]  [employer] PROGRESS NOTE  [name] [name] DUK:[id number] DOB: 08-Feb-1941 DOA: 03/30/2017 PCP: Jerrol Banana., MD  HPI/Subjective: Patient came in with shortness of breath cough and wheezing.  She has had weakness and falls at home.  Objective: Vitals:   03/31/17 0406 03/31/17 0800  BP: (!) 109/44   Pulse: 81   Resp: 14   Temp: 98 F (36.7 C)   SpO2: 92% 93%    Filed Weights   03/30/17 1252 03/30/17 2001  Weight: 66.2 kg (146 lb) 64 kg (141 lb 1.6 oz)    ROS: Review of Systems  Constitutional: Negative for chills and fever.  Eyes: Negative for blurred vision.  Respiratory: Positive for cough, shortness of breath and wheezing.   Cardiovascular: Negative for chest pain.  Gastrointestinal: Negative for abdominal pain, constipation, diarrhea, nausea and vomiting.  Genitourinary: Negative for dysuria.  Musculoskeletal: Negative for joint pain.  Neurological: Negative for dizziness and headaches.   Exam: Physical Exam  HENT:  Nose: No mucosal edema.  Mouth/Throat: No oropharyngeal exudate or posterior oropharyngeal edema.  Eyes: Conjunctivae, EOM and lids are normal. Pupils are equal, round, and reactive to light.  Neck: No JVD present. Carotid bruit is not present. No edema present. No thyroid mass and no thyromegaly present.  Cardiovascular: S1 normal and S2 normal. Exam reveals no gallop.  No murmur heard. Pulses:      Dorsalis pedis pulses are 2+ on the right side, and 2+ on the left side.  Respiratory: No respiratory distress. She has decreased breath sounds in the right middle field, the right lower field, the left middle field and the left lower field. She has wheezes in the right middle field, the right lower field, the left middle field and the left lower field. She has no rhonchi. She has no rales.  GI: Soft. Bowel sounds are normal. There is no tenderness.  Musculoskeletal:        Right shoulder: She exhibits no swelling.  Lymphadenopathy:    She has no cervical adenopathy.  Neurological: She is alert. No cranial nerve deficit.  Skin: Skin is warm. No rash noted. Nails show no clubbing.  Psychiatric: She has a normal mood and affect.      Data Reviewed: Basic Metabolic Panel: Recent Labs  Lab 03/30/17 1342 03/31/17 0423  NA 136 135  K 4.4 4.4  CL 101 103  CO2 28 26  GLUCOSE 194* 314*  BUN 27* 25*  CREATININE 1.77* 1.39*  CALCIUM 8.7* 8.1*  MG 2.2  --    Liver Function Tests: Recent Labs  Lab 03/30/17 1342  AST 34  ALT 36  ALKPHOS 83  BILITOT 0.7  PROT 7.4  ALBUMIN 3.4*   CBC: Recent Labs  Lab 03/30/17 1342 03/31/17 0423  WBC 5.0 4.0  NEUTROABS 3.9  --   HGB 8.9* 7.6*  HCT 26.7* 22.7*  MCV 93.8 93.3  PLT 278 231   Cardiac Enzymes: Recent Labs  Lab 03/30/17 1342  TROPONINI <0.03   BNP (last 3 results) Recent Labs    03/30/17 1343  BNP 37.0     CBG: Recent Labs  Lab 03/30/17 2037 03/31/17 0739 03/31/17 1140 03/31/17 1142  GLUCAP 287* 363* 475* 483*    Recent Results (from the past 240 hour(s))  Culture, blood (routine x 2)     Status: None (Preliminary result)   Collection Time: 03/30/17  1:43  PM  Result Value Ref Range Status   Specimen Description BLOOD BLOOD RIGHT FOREARM  Final   Special Requests   Final    BOTTLES DRAWN AEROBIC AND ANAEROBIC Blood Culture adequate volume   Culture   Final    NO GROWTH < 24 HOURS Performed at [facility], [address] [place], [place] [address]    Report Status PENDING  Incomplete  Culture, blood (routine x 2)     Status: None (Preliminary result)   Collection Time: 03/30/17  1:43 PM  Result Value Ref Range Status   Specimen Description BLOOD RIGHT ANTECUBITAL  Final   Special Requests   Final    BOTTLES DRAWN AEROBIC AND ANAEROBIC RIGHT ANTECUBITAL   Culture   Final    NO GROWTH < 24 HOURS Performed at [facility] [address], [place], [place] [address]    Report Status PENDING  Incomplete     Studies: Dg Chest 2 View  Result Date: 03/30/2017 CLINICAL DATA:  Patient with a history of COPD and lung cancer. Cough and congestion for 2-3 days. EXAM: CHEST  2 VIEW COMPARISON:  CT chest 03/17/2017. FINDINGS: The lungs are emphysematous but clear. No pneumothorax or pleural effusion. Heart size is normal. Port-A-Cath is in place. No acute bony abnormality. IMPRESSION: Emphysema without acute disease. Electronically Signed   By: Inge Rise M.D.   On: 03/30/2017 13:39   Ct Head Wo Contrast  Result Date: 03/30/2017 CLINICAL DATA:  Multiple falls over the last 3 weeks.  Somnolent. EXAM: CT HEAD WITHOUT CONTRAST TECHNIQUE: Contiguous axial images were obtained from the base of the skull through the vertex without intravenous contrast. COMPARISON:  08/29/2014 FINDINGS: Brain: No evidence of acute infarction, hemorrhage, hydrocephalus, extra-axial collection or mass lesion/mass effect. There is ventricular and sulcal enlargement reflecting mild diffuse atrophy. There is mild periventricular white matter hypoattenuation consistent with chronic microvascular ischemic change. These findings have developed since the prior head CT. Vascular: No hyperdense vessel or unexpected calcification. Skull: Normal. Negative for fracture or focal lesion. Sinuses/Orbits: Visualize globes and orbits are unremarkable. There is mild mucosal thickening and a small amount of dependent fluid in the right sphenoid sinus. Remaining visualized sinuses and mastoid air cells are clear. Other: None. IMPRESSION: 1. No acute intracranial abnormalities. 2. Mild generalized atrophy and chronic microvascular ischemic change, new since the prior head CT. Electronically Signed   By: Lajean Manes M.D.   On: 03/30/2017 15:17   US Venous Img Lower Bilateral  Result Date: 03/31/2017 CLINICAL DATA:  Elevated D-dimer. Bilateral lower extremity pain. History of lung  cancer. Evaluate for DVT. EXAM: BILATERAL LOWER EXTREMITY VENOUS DOPPLER ULTRASOUND TECHNIQUE: Gray-scale sonography with graded compression, as well as color Doppler and duplex ultrasound were performed to evaluate the lower extremity deep venous systems from the level of the common femoral vein and including the common femoral, femoral, profunda femoral, popliteal and calf veins including the posterior tibial, peroneal and gastrocnemius veins when visible. The superficial great saphenous vein was also interrogated. Spectral Doppler was utilized to evaluate flow at rest and with distal augmentation maneuvers in the common femoral, femoral and popliteal veins. COMPARISON:  None. FINDINGS: RIGHT LOWER EXTREMITY Common Femoral Vein: No evidence of thrombus. Normal compressibility, respiratory phasicity and response to augmentation. Saphenofemoral Junction: No evidence of thrombus. Normal compressibility and flow on color Doppler imaging. Profunda Femoral Vein: No evidence of thrombus. Normal compressibility and flow on color Doppler imaging. Femoral Vein: No evidence of thrombus. Normal  compressibility, respiratory phasicity and response to augmentation. Popliteal Vein: No evidence of thrombus. Normal compressibility, respiratory phasicity and response to augmentation. Calf Veins: No evidence of thrombus. Normal compressibility and flow on color Doppler imaging. Superficial Great Saphenous Vein: No evidence of thrombus. Normal compressibility. Venous Reflux:  None. Other Findings:  None. LEFT LOWER EXTREMITY Common Femoral Vein: No evidence of thrombus. Normal compressibility, respiratory phasicity and response to augmentation. Saphenofemoral Junction: No evidence of thrombus. Normal compressibility and flow on color Doppler imaging. Profunda Femoral Vein: No evidence of thrombus. Normal compressibility and flow on color Doppler imaging. Femoral Vein: No evidence of thrombus. Normal compressibility, respiratory  phasicity and response to augmentation. Popliteal Vein: No evidence of thrombus. Normal compressibility, respiratory phasicity and response to augmentation. Calf Veins: No evidence of thrombus. Normal compressibility and flow on color Doppler imaging. Superficial Great Saphenous Vein: No evidence of thrombus. Normal compressibility. Venous Reflux:  None. Other Findings:  None. IMPRESSION: No evidence of DVT within either lower extremity. Electronically Signed   By: Sandi Mariscal M.D.   On: 03/31/2017 11:21    Scheduled Meds: . albuterol  2.5 mg Nebulization Q6H  . atorvastatin  20 mg Oral QHS  . budesonide (PULMICORT) nebulizer solution  0.5 mg Nebulization BID  . gabapentin  300 mg Oral BID  . heparin  5,000 Units Subcutaneous Q8H  . insulin aspart  0-5 Units Subcutaneous QHS  . insulin aspart  0-9 Units Subcutaneous TID WC  . insulin aspart  3 Units Subcutaneous TID WC  . insulin glargine  12 Units Subcutaneous Daily  . isosorbide mononitrate  30 mg Oral Daily  . magnesium oxide  400 mg Oral BID  . mouth rinse  15 mL Mouth Rinse BID  . [START ON 04/01/2017] methylPREDNISolone (SOLU-MEDROL) injection  40 mg Intravenous Daily  . metoprolol tartrate  25 mg Oral BID  . pantoprazole  40 mg Oral Daily  . sodium chloride flush  3 mL Intravenous Q12H   Continuous Infusions: . sodium chloride      Assessment/Plan:  1. Acute hypoxic respiratory failure.  Continue oxygen supplementation and try to taper as much as possible. 2. COPD exacerbation.  Decrease Solu-Medrol to 40 mg IV daily.  Nebulizer treatments with budesonide and albuterol 3. Acute kidney injury on chronic kidney disease stage III.  Improved from yesterday. 4. Anemia.  Guaiac stools.  Ferritin elevated at 407 could be of chronic disease.  Check hemoglobin tomorrow.  Stop IV fluids. 5. Uncontrolled diabetes with steroids.  Start Lantus and sliding scale.  Decrease dose of steroids. 6. Weakness.  Physical therapy  evaluation. 7. Hyperlipidemia unspecified on atorvastatin 8. Diabetic neuropathy on gabapentin 9. GERD on Protonix  Code Status:     Code Status Orders  (From admission, onward)        Start     Ordered   03/30/17 1959  Full code  Continuous     03/30/17 1959    Code Status History    Date Active Date Inactive Code Status Order ID Comments User Context   07/15/2016 04:03 07/16/2016 18:58 Full Code [id number]  Harrie Foreman, MD Inpatient   02/05/2016 15:10 02/13/2016 18:11 Full Code [id number]  Henreitta Leber, MD Inpatient   01/27/2015 21:48 01/29/2015 17:59 Full Code [id number]  Lytle Butte, MD ED   10/24/2014 12:03 10/25/2014 16:26 Full Code [id number]  Algernon Huxley, MD Inpatient   08/30/2014 01:05 08/30/2014 19:48 Full Code [id number]  Lance Coon, MD Inpatient   10/19/2013 16:19  10/20/2013 16:04 Full Code [id number]  Newman Pies, MD Inpatient      Disposition Plan: Breathing will need to improve prior to disposition  Time spent: 28 minutes  [place]

## 2017-03-31 NOTE — Evaluation (Signed)
Physical Therapy Evaluation Patient Details Name: [name] [name] MRN: [id number] DOB: 04-17-1940 Today's Date: 03/31/2017   History of Present Illness  76 yo female with onset of acute respiratory failure and hypoxia, with COPD exacerbation and AKI, was admitted with anemia.  Her PMH:  PN, DM, lung CA, stroke, PNA,   Clinical Impression  Pt is up to walk with assistance, very light headed with effort and had to return to chair to sit and rest.  Has some history of O2 use at bedtime and now apparently needs to use it often.  Will need to try stairs if pt is going directly home vs staying in SNF to recover her independence.  Follow acutely for safety of gait, endurance, strengthening ex's and balance training.    Follow Up Recommendations SNF    Equipment Recommendations  None recommended by PT    Recommendations for Other Services       Precautions / Restrictions Precautions Precautions: Fall Restrictions Weight Bearing Restrictions: No      Mobility  Bed Mobility Overal bed mobility: Needs Assistance Bed Mobility: Supine to Sit     Supine to sit: Min guard;Min assist     General bed mobility comments: assisted to finish scooting to EOB  Transfers Overall transfer level: Needs assistance Equipment used: Rolling walker (2 wheeled);1 person hand held assist Transfers: Sit to/from Stand Sit to Stand: Min guard;Min assist         General transfer comment: pt prefers to use walker and will not push off bed surfaces  Ambulation/Gait Ambulation/Gait assistance: Min guard;Min assist Ambulation Distance (Feet): 35 Feet Assistive device: Rolling walker (2 wheeled);1 person hand held assist Gait Pattern/deviations: Step-through pattern;Decreased stride length;Wide base of support;Shuffle Gait velocity: reduced Gait velocity interpretation: Below normal speed for age/gender General Gait Details: pt is up to stand and walk but felt light headed very quickly  Stairs            Wheelchair Mobility    Modified Rankin (Stroke Patients Only)       Balance Overall balance assessment: Needs assistance Sitting-balance support: Bilateral upper extremity supported;Feet supported Sitting balance-Leahy Scale: Fair     Standing balance support: Bilateral upper extremity supported;During functional activity Standing balance-Leahy Scale: Poor                               Pertinent Vitals/Pain Pain Assessment: No/denies pain    Home Living Family/patient expects to be discharged to:: Private residence Living Arrangements: Spouse/significant other Available Help at Discharge: Family Type of Home: Mobile home Home Access: Stairs to enter Entrance Stairs-Rails: Right;Left;Can reach both Entrance Stairs-Number of Steps: 6 Home Layout: One level Home Equipment: Walker - 2 wheels;Cane - single point;Wheelchair - manual;Shower seat;Bedside commode      Prior Function Level of Independence: Independent with assistive device(s)         Comments: O2 at night, SPC for most gait     Hand Dominance   Dominant Hand: Right    Extremity/Trunk Assessment   Upper Extremity Assessment Upper Extremity Assessment: Generalized weakness    Lower Extremity Assessment Lower Extremity Assessment: Generalized weakness    Cervical / Trunk Assessment Cervical / Trunk Assessment: Kyphotic  Communication   Communication: No difficulties  Cognition Arousal/Alertness: Awake/alert Behavior During Therapy: WFL for tasks assessed/performed Overall Cognitive Status: Within Functional Limits for tasks assessed  General Comments General comments (skin integrity, edema, etc.): Pt has IV in her arm and some signficant sensitive injured skin.      Exercises     Assessment/Plan    PT Assessment Patient needs continued PT services  PT Problem List Decreased strength;Decreased range of  motion;Decreased activity tolerance;Decreased balance;Decreased mobility;Decreased coordination;Decreased knowledge of use of DME;Decreased safety awareness;Cardiopulmonary status limiting activity;Obesity;Decreased skin integrity       PT Treatment Interventions DME instruction;Gait training;Functional mobility training;Therapeutic activities;Therapeutic exercise;Balance training;Neuromuscular re-education;Cognitive remediation;Patient/family education    PT Goals (Current goals can be found in the Care Plan section)  Acute Rehab PT Goals Patient Stated Goal: to walk safer and go home PT Goal Formulation: With patient Time For Goal Achievement: 04/14/17 Potential to Achieve Goals: Good    Frequency Min 2X/week   Barriers to discharge Decreased caregiver support;Inaccessible home environment husband is not able to assist her    Co-evaluation               AM-PAC PT "6 Clicks" Daily Activity  Outcome Measure Difficulty turning over in bed (including adjusting bedclothes, sheets and blankets)?: Unable Difficulty moving from lying on back to sitting on the side of the bed? : Unable Difficulty sitting down on and standing up from a chair with arms (e.g., wheelchair, bedside commode, etc,.)?: Unable Help needed moving to and from a bed to chair (including a wheelchair)?: A Little Help needed walking in hospital room?: A Little Help needed climbing 3-5 steps with a railing? : A Lot 6 Click Score: 11    End of Session Equipment Utilized During Treatment: Gait belt;Oxygen Activity Tolerance: Patient limited by fatigue Patient left: in chair;with call bell/phone within reach;with chair alarm set Nurse Communication: Mobility status PT Visit Diagnosis: Unsteadiness on feet (R26.81);Muscle weakness (generalized) (M62.81);Difficulty in walking, not elsewhere classified (R26.2);Adult, failure to thrive (R62.7);Hemiplegia and hemiparesis Hemiplegia - Right/Left: Right Hemiplegia -  dominant/non-dominant: Dominant    Time: 9470-9628 PT Time Calculation (min) (ACUTE ONLY): 29 min   Charges:   PT Evaluation $PT Eval Moderate Complexity: 1 Mod PT Treatments $Gait Training: 8-22 mins   PT G Codes:   PT G-Codes **NOT FOR INPATIENT CLASS** Functional Assessment Tool Used: AM-PAC 6 Clicks Basic Mobility    Ramond Dial 03/31/2017, 5:20 PM   Mee Hives, PT MS Acute Rehab Dept. Number: [place] and [place]

## 2017-03-31 NOTE — NC FL2 (Signed)
[place] LEVEL OF CARE SCREENING TOOL     IDENTIFICATION  Patient Name: [name] [name] Birthdate: 1940/12/30 Sex: female Admission Date (Current Location): 03/30/2017  [place] and [place] Number:  Engineering geologist and Address:  [facility], [address], [place], [place] [address]      Provider Number: [id number]  Attending Physician Name and Address:  Loletha Grayer, MD  Relative Name and Phone Number:       Current Level of Care: Hospital Recommended Level of Care: [place] Prior Approval Number:    Date Approved/Denied:   PASRR Number: [id number])  Discharge Plan: SNF    Current Diagnoses: Patient Active Problem List   Diagnosis Date Noted  . Malnutrition of moderate degree 03/31/2017  . Acute respiratory failure with hypoxia ([place]) 03/30/2017  . AKI (acute kidney injury) ([place]) 07/15/2016  . Protein-calorie malnutrition, severe 02/08/2016  . Acute renal failure (ARF) ([place]) 02/05/2016  . Primary cancer of right upper lobe of lung ([place]) 11/20/2015  . Adenopathy   . Abnormal CT lung screening 10/17/2015  . Personal history of tobacco use, presenting hazards to health 10/15/2015  . NSTEMI (non-ST elevated myocardial infarction) ([place]) 01/27/2015  . Chronic vulvitis 09/26/2014  . Allergic reaction 09/26/2014  . Carotid stenosis 08/30/2014  . Cervical nerve root disorder 08/10/2014  . Absolute anemia 08/10/2014  . CAD in native artery 08/10/2014  . B12 deficiency 08/10/2014  . Back ache 08/10/2014  . Bronchitis, chronic ([place]) 08/10/2014  . Diabetes mellitus with polyneuropathy ([place]) 08/10/2014  . Can't get food down 08/10/2014  . Eczema of external ear 08/10/2014  . Accumulation of fluid in tissues 08/10/2014  . Gout 08/10/2014  . Adult hypothyroidism 08/10/2014  . Mononeuritis 08/10/2014  . Muscle ache 08/10/2014  . Disorder of peripheral nervous system 08/10/2014  . Lesion of vulva 08/10/2014   . Cervical spondylosis with radiculopathy 10/19/2013  . Unstable angina ([place]) 08/15/2013  . COPD exacerbation ([place]) 03/29/2013  . CAD (coronary artery disease) 06/22/2011  . COPD (chronic obstructive pulmonary disease) with emphysema ([place]) 03/07/2010  . CHEST PAIN UNSPECIFIED 07/22/2009  . HLD (hyperlipidemia) 04/01/2009  . Malaise and fatigue 04/01/2009  . Hyperlipidemia 01/18/2009  . TOBACCO ABUSE 01/18/2009  . HYPERTENSION, BENIGN 01/18/2009  . CLAUDICATION 01/18/2009  . Pain in limb 01/18/2009  . CAFL (chronic airflow limitation) ([place]) 01/20/2007  . Late effects of cerebrovascular disease 01/10/2007  . Essential (primary) hypertension 12/22/2006    Orientation RESPIRATION BLADDER Height & Weight     Self, Time, Situation, Place  O2(2 Liters Oxygen. ) Continent Weight: 141 lb 1.6 oz (64 kg) Height:  5\' 2"  (157.5 cm)  BEHAVIORAL SYMPTOMS/MOOD NEUROLOGICAL BOWEL NUTRITION STATUS      Continent Diet(Diet: Heart Healthy/ Carb Modified. )  AMBULATORY STATUS COMMUNICATION OF NEEDS Skin   Extensive Assist Verbally Normal                       Personal Care Assistance Level of Assistance  Bathing, Feeding, Dressing Bathing Assistance: Limited assistance Feeding assistance: Independent Dressing Assistance: Limited assistance     Functional Limitations Info  Sight, Hearing, Speech Sight Info: Adequate Hearing Info: Adequate Speech Info: Adequate    SPECIAL CARE FACTORS FREQUENCY  PT (By licensed PT), OT (By licensed OT)     PT Frequency: (5) OT Frequency: (5)            Contractures      Additional Factors Info  Code  Status, Allergies Code Status Info: (Full Code. ) Allergies Info: (Coconut Fatty Acids)           Current Medications (03/31/2017):  This is the current hospital active medication list Current Facility-Administered Medications  Medication Dose Route Frequency Provider Last Rate Last Dose  . 0.9 %  sodium chloride infusion  250 mL  Intravenous PRN Demetrios Loll, MD      . acetaminophen (TYLENOL) tablet 650 mg  650 mg Oral Q6H PRN Demetrios Loll, MD       Or  . acetaminophen (TYLENOL) suppository 650 mg  650 mg Rectal Q6H PRN Demetrios Loll, MD      . albuterol (PROVENTIL) (2.5 MG/3ML) 0.083% nebulizer solution 2.5 mg  2.5 mg Nebulization Q2H PRN Demetrios Loll, MD      . albuterol (PROVENTIL) (2.5 MG/3ML) 0.083% nebulizer solution 2.5 mg  2.5 mg Nebulization Q6H Demetrios Loll, MD   2.5 mg at 03/31/17 2014  . atorvastatin (LIPITOR) tablet 20 mg  20 mg Oral QHS Demetrios Loll, MD   20 mg at 03/31/17 2146  . bisacodyl (DULCOLAX) EC tablet 5 mg  5 mg Oral Daily PRN Demetrios Loll, MD      . budesonide (PULMICORT) nebulizer solution 0.5 mg  0.5 mg Nebulization BID Loletha Grayer, MD   0.5 mg at 03/31/17 2015  . diazepam (VALIUM) tablet 5 mg  5 mg Oral Q12H PRN Demetrios Loll, MD      . gabapentin (NEURONTIN) tablet 300 mg  300 mg Oral BID Demetrios Loll, MD   300 mg at 03/31/17 2146  . guaiFENesin-dextromethorphan (ROBITUSSIN DM) 100-10 MG/5ML syrup 5 mL  5 mL Oral Q4H PRN Demetrios Loll, MD   5 mL at 03/31/17 2145  . heparin injection 5,000 Units  5,000 Units Subcutaneous Q8H Demetrios Loll, MD   5,000 Units at 03/31/17 2145  . HYDROcodone-acetaminophen (NORCO/VICODIN) 5-325 MG per tablet 1-2 tablet  1-2 tablet Oral Q4H PRN Demetrios Loll, MD      . insulin aspart (novoLOG) injection 0-5 Units  0-5 Units Subcutaneous QHS Demetrios Loll, MD   4 Units at 03/31/17 2149  . insulin aspart (novoLOG) injection 0-9 Units  0-9 Units Subcutaneous TID WC Demetrios Loll, MD   16 Units at 03/31/17 1655  . insulin aspart (novoLOG) injection 3 Units  3 Units Subcutaneous TID WC Loletha Grayer, MD   3 Units at 03/31/17 1654  . insulin glargine (LANTUS) injection 12 Units  12 Units Subcutaneous Daily Loletha Grayer, MD   12 Units at 03/31/17 1654  . isosorbide mononitrate (IMDUR) 24 hr tablet 30 mg  30 mg Oral Daily Demetrios Loll, MD   30 mg at 03/31/17 0748  . magnesium oxide (MAG-OX) tablet  400 mg  400 mg Oral BID Demetrios Loll, MD   400 mg at 03/31/17 2145  . MEDLINE mouth rinse  15 mL Mouth Rinse BID Demetrios Loll, MD   15 mL at 03/31/17 2150  . [START ON 04/01/2017] methylPREDNISolone sodium succinate (SOLU-MEDROL) 40 mg/mL injection 40 mg  40 mg Intravenous Daily Wieting, Richard, MD      . metoprolol tartrate (LOPRESSOR) tablet 25 mg  25 mg Oral BID Demetrios Loll, MD   25 mg at 03/31/17 2150  . nitroGLYCERIN (NITROSTAT) SL tablet 0.4 mg  0.4 mg Sublingual Q5 min PRN Demetrios Loll, MD      . ondansetron [facility]) tablet 4 mg  4 mg Oral Q6H PRN Demetrios Loll, MD       Or  .  ondansetron (ZOFRAN) injection 4 mg  4 mg Intravenous Q6H PRN Demetrios Loll, MD      . pantoprazole (PROTONIX) EC tablet 40 mg  40 mg Oral Daily Demetrios Loll, MD   40 mg at 03/31/17 0748  . senna-docusate (Senokot-S) tablet 1 tablet  1 tablet Oral QHS PRN Demetrios Loll, MD      . sodium chloride flush (NS) 0.9 % injection 3 mL  3 mL Intravenous Q12H Demetrios Loll, MD   3 mL at 03/31/17 2151  . sodium chloride flush (NS) 0.9 % injection 3 mL  3 mL Intravenous PRN Demetrios Loll, MD       Facility-Administered Medications Ordered in Other Encounters  Medication Dose Route Frequency Provider Last Rate Last Dose  . ondansetron (ZOFRAN) 8 mg, dexamethasone (DECADRON) 10 mg in sodium chloride 0.9 % 50 mL IVPB   Intravenous Once Lloyd Huger, MD         Discharge Medications: Please see discharge summary for a list of discharge medications.  Relevant Imaging Results:  Relevant Lab Results:   Additional Information (SSN: [id number])  Tashi Band, Veronia Beets, LCSW

## 2017-03-31 NOTE — Progress Notes (Signed)
Initial Nutrition Assessment  DOCUMENTATION CODES:   Non-severe (moderate) malnutrition in context of chronic illness  INTERVENTION:  Patient has refused any oral nutrition supplements.  Provide snacks po BID between meals. Ordered by RD.  Discussed patient's increased needs for calories and protein. Encouraged intake of a good source of protein at each meal and snack.  NUTRITION DIAGNOSIS:   Moderate Malnutrition related to chronic illness(COPD, hx lung cancer) as evidenced by 14.1 percent weight loss over 8 months, mild muscle depletion.  GOAL:   Patient will meet greater than or equal to 90% of their needs  MONITOR:   PO intake, Labs, Weight trends, I & O's  REASON FOR ASSESSMENT:   Malnutrition Screening Tool    ASSESSMENT:   76 year old female with PMHx of CAD, HTN, DM type 2, macular degeneration, COPD, stage IIIa small cell lung carcinoma of right upper lobe s/p chemotherapy now admitted with acute hypoxic respiratory failure, COPD exacerbation, ARF on CKD III.   Met with patient at bedside. She reports her appetite is good. She is eating 2 good meals per day. For breakfast she has bacon, eggs, and toast. For dinner she has a meat and two vegetables. She reports she always finishes a good portion of her meals. She is not sure why she is losing weight since she has been eating well. She does not like any oral nutrition supplements and refuses to try any here. She is amenable to eating snacks between meals.  UBW 170 lbs back in June/July. Per chart patient was 164.3 lbs on 07/16/2016. She has lost 23.2 lbs (14.1% body weight) over the past 8 months, which is significant for time frame.  Medications reviewed and include: Novolog 0-9 units TID, Novolog 0-5 units QHS, Novolog 3 units TID, Lantus 12 units daily, Solu-Medrol 40 mg daily IV, pantoprazole.  Labs reviewed: CBG 287-483, BUN 25, Creatinine 1.39.  NUTRITION - FOCUSED PHYSICAL EXAM:    Most Recent Value  Orbital  Region  No depletion  Upper Arm Region  No depletion  Thoracic and Lumbar Region  No depletion  Buccal Region  No depletion  Temple Region  Mild depletion  Clavicle Bone Region  Mild depletion  Clavicle and Acromion Bone Region  Mild depletion  Scapular Bone Region  No depletion  Dorsal Hand  No depletion  Patellar Region  No depletion  Anterior Thigh Region  No depletion  Posterior Calf Region  No depletion  Edema (RD Assessment)  None  Hair  Reviewed  Eyes  Reviewed  Mouth  Reviewed  Skin  Reviewed  Nails  Reviewed     Diet Order:  Diet heart healthy/carb modified Room service appropriate? Yes; Fluid consistency: Thin  EDUCATION NEEDS:   Education needs have been addressed  Skin:  Skin Assessment: Skin Integrity Issues: Skin Integrity Issues:: Other (Comment) Other: MSAD to left breast  Last BM:  PTA (03/29/2017)  Height:   Ht Readings from Last 1 Encounters:  03/30/17 '5\' 2"'  (1.575 m)    Weight:   Wt Readings from Last 1 Encounters:  03/30/17 141 lb 1.6 oz (64 kg)    Ideal Body Weight:  50 kg  BMI:  Body mass index is 25.81 kg/m.  Estimated Nutritional Needs:   Kcal:  1425-1640 (MSJ x 1.3-1.5)  Protein:  75-90 grams (1.2-1.4 grams/kg)  Fluid:  1.6-1.9 L/day (25-30 mL/kg)  Willey Blade, MS, RD, LDN Office: [phone] Pager: [phone] After Hours/Weekend Pager: [phone]

## 2017-03-31 NOTE — Progress Notes (Signed)
Inpatient Diabetes Program Recommendations  AACE/ADA: New Consensus Statement on Inpatient Glycemic Control (2015)  Target Ranges:  Prepandial:   less than 140 mg/dL      Peak postprandial:   less than 180 mg/dL (1-2 hours)      Critically ill patients:  140 - 180 mg/dL   Results for RAHMA, MELLER (MRN [id number]) as of 03/31/2017 11:24  Ref. Range 03/30/2017 20:37 03/31/2017 07:39  Glucose-Capillary Latest Ref Range: 65 - 99 mg/dL 287 (H) 363 (H)    Admit with: COPD  History: DM  Home DM Meds: Amaryl 2 mg BID  Current Insulin Orders: Novolog Sensitive Correction Scale/ SSI (0-9 units) TID AC + HS       MD- Note patient getting Solumedrol 40 mg BID.  CBG this AM: 363 mg/dl.  Please consider the following while patient getting steroids:  1. Start Lantus 12 units daily (0.2 units/kg)  2. Start Novolog Meal Coverage: Novolog 3 units TID with meals (hold if pt eats <50% of meal)      --Will follow patient during hospitalization--  Wyn Quaker RN, MSN, CDE Diabetes Coordinator Inpatient Glycemic Control Team Team Pager: [phone] (8a-5p)

## 2017-04-01 LAB — URINALYSIS, COMPLETE (UACMP) WITH MICROSCOPIC
BILIRUBIN URINE: NEGATIVE
Bacteria, UA: NONE SEEN
HGB URINE DIPSTICK: NEGATIVE
Ketones, ur: NEGATIVE mg/dL
LEUKOCYTES UA: NEGATIVE
NITRITE: NEGATIVE
PROTEIN: NEGATIVE mg/dL
RBC / HPF: NONE SEEN RBC/hpf (ref 0–5)
SPECIFIC GRAVITY, URINE: 1.01 (ref 1.005–1.030)
pH: 5 (ref 5.0–8.0)

## 2017-04-01 LAB — CBC
HEMATOCRIT: 24 % — AB (ref 35.0–47.0)
HEMOGLOBIN: 7.7 g/dL — AB (ref 12.0–16.0)
MCH: 30.2 pg (ref 26.0–34.0)
MCHC: 32.3 g/dL (ref 32.0–36.0)
MCV: 93.6 fL (ref 80.0–100.0)
Platelets: 278 10*3/uL (ref 150–440)
RBC: 2.56 MIL/uL — ABNORMAL LOW (ref 3.80–5.20)
RDW: 14.7 % — AB (ref 11.5–14.5)
WBC: 11.9 10*3/uL — AB (ref 3.6–11.0)

## 2017-04-01 LAB — BASIC METABOLIC PANEL
ANION GAP: 8 (ref 5–15)
BUN: 24 mg/dL — ABNORMAL HIGH (ref 6–20)
CALCIUM: 8.8 mg/dL — AB (ref 8.9–10.3)
CO2: 27 mmol/L (ref 22–32)
CREATININE: 1.32 mg/dL — AB (ref 0.44–1.00)
Chloride: 99 mmol/L — ABNORMAL LOW (ref 101–111)
GFR calc non Af Amer: 38 mL/min — ABNORMAL LOW (ref >60)
GFR, EST AFRICAN AMERICAN: 45 mL/min — AB (ref >60)
Glucose, Bld: 369 mg/dL — ABNORMAL HIGH (ref 65–99)
Potassium: 4.5 mmol/L (ref 3.5–5.1)
SODIUM: 134 mmol/L — AB (ref 135–145)

## 2017-04-01 LAB — GLUCOSE, CAPILLARY
GLUCOSE-CAPILLARY: 250 mg/dL — AB (ref 65–99)
GLUCOSE-CAPILLARY: 392 mg/dL — AB (ref 65–99)
GLUCOSE-CAPILLARY: 409 mg/dL — AB (ref 65–99)
Glucose-Capillary: 282 mg/dL — ABNORMAL HIGH (ref 65–99)
Glucose-Capillary: 448 mg/dL — ABNORMAL HIGH (ref 65–99)

## 2017-04-01 MED ORDER — GABAPENTIN 300 MG PO CAPS
300.0000 mg | ORAL_CAPSULE | Freq: Two times a day (BID) | ORAL | Status: DC
  Administered 2017-04-01 – 2017-04-03 (×4): 300 mg via ORAL
  Filled 2017-04-01 (×4): qty 1

## 2017-04-01 MED ORDER — INSULIN ASPART 100 UNIT/ML ~~LOC~~ SOLN
5.0000 [IU] | Freq: Three times a day (TID) | SUBCUTANEOUS | Status: DC
  Administered 2017-04-01 – 2017-04-03 (×5): 5 [IU] via SUBCUTANEOUS
  Filled 2017-04-01 (×5): qty 1

## 2017-04-01 MED ORDER — INSULIN REGULAR HUMAN 100 UNIT/ML IJ SOLN
10.0000 [IU] | Freq: Once | INTRAMUSCULAR | Status: AC
  Administered 2017-04-01: 10 [IU] via INTRAVENOUS
  Filled 2017-04-01: qty 0.1

## 2017-04-01 MED ORDER — AMOXICILLIN-POT CLAVULANATE 500-125 MG PO TABS
1.0000 | ORAL_TABLET | Freq: Two times a day (BID) | ORAL | Status: DC
  Administered 2017-04-01 – 2017-04-03 (×4): 500 mg via ORAL
  Filled 2017-04-01 (×4): qty 1

## 2017-04-01 MED ORDER — INSULIN ASPART 100 UNIT/ML ~~LOC~~ SOLN
0.0000 [IU] | Freq: Three times a day (TID) | SUBCUTANEOUS | Status: DC
  Administered 2017-04-02: 12:00:00 15 [IU] via SUBCUTANEOUS
  Administered 2017-04-02: 18:00:00 5 [IU] via SUBCUTANEOUS
  Administered 2017-04-02: 15 [IU] via SUBCUTANEOUS
  Administered 2017-04-02: 5 [IU] via SUBCUTANEOUS
  Administered 2017-04-03: 08:00:00 3 [IU] via SUBCUTANEOUS
  Filled 2017-04-01 (×5): qty 1

## 2017-04-01 MED ORDER — INSULIN GLARGINE 100 UNIT/ML ~~LOC~~ SOLN
15.0000 [IU] | Freq: Every day | SUBCUTANEOUS | Status: DC
  Administered 2017-04-02 – 2017-04-03 (×2): 15 [IU] via SUBCUTANEOUS
  Filled 2017-04-01 (×2): qty 0.15

## 2017-04-01 NOTE — Progress Notes (Signed)
Inpatient Diabetes Program Recommendations  AACE/ADA: New Consensus Statement on Inpatient Glycemic Control (2015)  Target Ranges:  Prepandial:   less than 140 mg/dL      Peak postprandial:   less than 180 mg/dL (1-2 hours)      Critically ill patients:  140 - 180 mg/dL   Results for [name] [name], [name] [name] (MRN [id number]) as of 04/01/2017 12:23  Ref. Range 03/31/2017 07:39 03/31/2017 11:40 03/31/2017 11:42 03/31/2017 16:36 03/31/2017 16:40 03/31/2017 21:22  Glucose-Capillary Latest Ref Range: 65 - 99 mg/dL 363 (H) 475 (H) 483 (H) 430 (H) 442 (H) 329 (H)   Results for [name] [name], [name] [name] (MRN [id number]) as of 04/01/2017 12:23  Ref. Range 04/01/2017 07:40 04/01/2017 11:40  Glucose-Capillary Latest Ref Range: 65 - 99 mg/dL 250 (H) 392 (H)    Admit with: COPD  History: DM  Home DM Meds: Amaryl 2 mg BID  Current Insulin Orders: Novolog Sensitive Correction Scale/ SSI (0-9 units) TID AC + HS    Lantus 12 units daily    Novolog 3 units TID with meals         MD- Note Solumedrol reduced to 40 mg daily.  CBGs still quite elevated today.  MD- Please consider the following in-hospital insulin adjustments:  1. Increase Lantus to 15 units daily  2. Increase Novolog Meal Coverage to: Novolog 5 units TID with meals (hold if pt eats <50% of meal)     --Will follow patient during hospitalization--  Wyn Quaker RN, MSN, CDE Diabetes Coordinator Inpatient Glycemic Control Team Team Pager: [phone] (8a-5p)

## 2017-04-01 NOTE — Progress Notes (Signed)
Physical Therapy Treatment Patient Details Name: [name] [name] MRN: [id number] DOB: 10-04-1940 Today's Date: 04/01/2017    History of Present Illness 76 yo female with onset of acute respiratory failure and hypoxia, with COPD exacerbation and AKI, was admitted with anemia.  Her PMH:  PN, DM, lung CA, stroke, PNA,     PT Comments    Pt agreeable to PT; denies pain and reports feeling better today. Pt progressing bed mobility and transfers to mod I and Min guard respectively.  Ambulation distance progressing; O2 saturation on 2 liters remains 93% post walk with Heart rate only increasing to 82 beats per minute. Pt does fatigue significantly towards end of walk and wishes returned to bed. Discharge changed to [place]; pt will need to perform steps to return home safely. SW/CM updated via sticky note. Continue PT to progress functional mobility and allow for an optimal return home.   Follow Up Recommendations  Home health PT     Equipment Recommendations       Recommendations for Other Services       Precautions / Restrictions Precautions Precautions: Fall Restrictions Weight Bearing Restrictions: No    Mobility  Bed Mobility Overal bed mobility: Modified Independent Bed Mobility: Supine to Sit;Sit to Supine     Supine to sit: Modified independent (Device/Increase time) Sit to supine: Modified independent (Device/Increase time)   General bed mobility comments: Mild increased time and use of rail.   Transfers Overall transfer level: Needs assistance Equipment used: Rolling walker (2 wheeled);1 person hand held assist Transfers: Sit to/from Stand Sit to Stand: Min guard         General transfer comment: cues for hand placement and is compliant this session.   Ambulation/Gait Ambulation/Gait assistance: Min guard Ambulation Distance (Feet): 210 Feet Assistive device: Rolling walker (2 wheeled);1 person hand held assist   Gait velocity: reduced   General Gait Details:  Subjectively about pt's baseline 75% of the walk; but fatigues rather quickly at that point with last 25% of walk, pt feeling very tired and moving much slower    Stairs            Wheelchair Mobility    Modified Rankin (Stroke Patients Only)       Balance Overall balance assessment: Needs assistance Sitting-balance support: Bilateral upper extremity supported;Feet supported Sitting balance-Leahy Scale: Good     Standing balance support: Bilateral upper extremity supported Standing balance-Leahy Scale: Fair                              Cognition Arousal/Alertness: Awake/alert Behavior During Therapy: WFL for tasks assessed/performed Overall Cognitive Status: Within Functional Limits for tasks assessed                                        Exercises General Exercises - Lower Extremity Long Arc Quad: AROM;Both;15 reps;Seated Hip ABduction/ADduction: AROM;Both;15 reps;Seated Hip Flexion/Marching: AROM;Both;15 reps;Seated Toe Raises: AROM;Both;15 reps;Seated Heel Raises: AROM;Both;15 reps;Seated    General Comments        Pertinent Vitals/Pain Pain Assessment: No/denies pain    Home Living                      Prior Function            PT Goals (current goals can now be found in the care plan  section) Progress towards PT goals: Progressing toward goals    Frequency    Min 2X/week      PT Plan Discharge plan needs to be updated;Other (comment)(pt will need to perform steps)    Co-evaluation              AM-PAC PT "6 Clicks" Daily Activity  Outcome Measure  Difficulty turning over in bed (including adjusting bedclothes, sheets and blankets)?: None Difficulty moving from lying on back to sitting on the side of the bed? : None Difficulty sitting down on and standing up from a chair with arms (e.g., wheelchair, bedside commode, etc,.)?: A Little Help needed moving to and from a bed to chair (including a  wheelchair)?: A Little Help needed walking in hospital room?: A Little Help needed climbing 3-5 steps with a railing? : A Little 6 Click Score: 20    End of Session Equipment Utilized During Treatment: Gait belt;Oxygen Activity Tolerance: Patient limited by fatigue Patient left: in bed;with call bell/phone within reach;with bed alarm set;with family/visitor present   PT Visit Diagnosis: Unsteadiness on feet (R26.81);Muscle weakness (generalized) (M62.81);Difficulty in walking, not elsewhere classified (R26.2);Adult, failure to thrive (R62.7);Hemiplegia and hemiparesis Hemiplegia - Right/Left: Right Hemiplegia - dominant/non-dominant: Dominant     Time: 1610-9604 PT Time Calculation (min) (ACUTE ONLY): 27 min  Charges:  $Gait Training: 8-22 mins $Therapeutic Exercise: 8-22 mins                    G Codes:        Larae Grooms, PTA 04/01/2017, 4:28 PM

## 2017-04-01 NOTE — Clinical Social Work Placement (Signed)
   CLINICAL SOCIAL WORK PLACEMENT  NOTE  Date:  04/01/2017  Patient Details  Name: [name] [name] MRN: [id number] Date of Birth: [birth year]  Clinical Social Work is seeking post-discharge placement for this patient at the [place] level of care (*CSW will initial, date and re-position this form in  chart as items are completed):  Yes   Patient/family provided with [facility] Work Department's list of facilities offering this level of care within the geographic area requested by the patient (or if unable, by the patient's family).  Yes   Patient/family informed of their freedom to choose among providers that offer the needed level of care, that participate in Medicare, Medicaid or managed care program needed by the patient, have an available bed and are willing to accept the patient.  Yes   Patient/family informed of [facility]'s ownership interest in [facility] and [facility], as well as of the fact that they are under no obligation to receive care at these facilities.  PASRR submitted to EDS on 03/31/17     PASRR number received on 03/31/17     Existing PASRR number confirmed on       FL2 transmitted to all facilities in geographic area requested by pt/family on 03/31/17     FL2 transmitted to all facilities within larger geographic area on       Patient informed that his/her managed care company has contracts with or will negotiate with certain facilities, including the following:        Yes   Patient/family informed of bed offers received.  Patient chooses bed at [facility]. )     Physician recommends and patient chooses bed at      Patient to be transferred to   on  .  Patient to be transferred to facility by       Patient family notified on   of transfer.  Name of family member notified:        PHYSICIAN       Additional Comment:    _______________________________________________ Shulamit Donofrio, Veronia Beets,  LCSW 04/01/2017, 5:05 PM

## 2017-04-01 NOTE — Progress Notes (Signed)
Patient ID: GRACEANNA THEISSEN, female   DOB: Nov 23, 1940, 76 y.o.   MRN: [id number]   [facility] PROGRESS NOTE  AREYA LEMMERMAN SWN:[id number] DOB: 05-13-1940 DOA: 03/30/2017 PCP: Jerrol Banana., MD  HPI/Subjective: Patient feeling better today than yesterday.  Still having some cough and shortness of breath and wheezing.  Having some ear pain.  Objective: Vitals:   04/01/17 1236 04/01/17 1303  BP: (!) 124/40   Pulse: 69   Resp: 20   Temp: 98.9 F (37.2 C)   SpO2: 94% 94%    Filed Weights   03/30/17 1252 03/30/17 2001  Weight: 66.2 kg (146 lb) 64 kg (141 lb 1.6 oz)    ROS: Review of Systems  Constitutional: Negative for chills and fever.  Eyes: Negative for blurred vision.  Respiratory: Positive for cough, shortness of breath and wheezing.   Cardiovascular: Negative for chest pain.  Gastrointestinal: Negative for abdominal pain, constipation, diarrhea, nausea and vomiting.  Genitourinary: Negative for dysuria.  Musculoskeletal: Negative for joint pain.  Neurological: Negative for dizziness and headaches.   Exam: Physical Exam  HENT:  Nose: No mucosal edema.  Mouth/Throat: No oropharyngeal exudate or posterior oropharyngeal edema.  Left tympanic membrane bulging and cloudy.  Eyes: Conjunctivae, EOM and lids are normal. Pupils are equal, round, and reactive to light.  Neck: No JVD present. Carotid bruit is not present. No edema present. No thyroid mass and no thyromegaly present.  Cardiovascular: S1 normal and S2 normal. Exam reveals no gallop.  No murmur heard. Pulses:      Dorsalis pedis pulses are 2+ on the right side, and 2+ on the left side.  Respiratory: No respiratory distress. She has decreased breath sounds in the right middle field, the right lower field, the left middle field and the left lower field. She has wheezes in the right middle field, the right lower field, the left middle field and the left lower field. She has no rhonchi. She has no rales.   GI: Soft. Bowel sounds are normal. There is no tenderness.  Musculoskeletal:       Right shoulder: She exhibits no swelling.  Lymphadenopathy:    She has no cervical adenopathy.  Neurological: She is alert. No cranial nerve deficit.  Skin: Skin is warm. No rash noted. Nails show no clubbing.  Psychiatric: She has a normal mood and affect.      Data Reviewed: Basic Metabolic Panel: Recent Labs  Lab 03/30/17 1342 03/31/17 0423 04/01/17 1157  NA 136 135 134*  K 4.4 4.4 4.5  CL 101 103 99*  CO2 28 26 27   GLUCOSE 194* 314* 369*  BUN 27* 25* 24*  CREATININE 1.77* 1.39* 1.32*  CALCIUM 8.7* 8.1* 8.8*  MG 2.2  --   --    Liver Function Tests: Recent Labs  Lab 03/30/17 1342  AST 34  ALT 36  ALKPHOS 83  BILITOT 0.7  PROT 7.4  ALBUMIN 3.4*   CBC: Recent Labs  Lab 03/30/17 1342 03/31/17 0423 04/01/17 1157  WBC 5.0 4.0 11.9*  NEUTROABS 3.9  --   --   HGB 8.9* 7.6* 7.7*  HCT 26.7* 22.7* 24.0*  MCV 93.8 93.3 93.6  PLT 278 231 278   Cardiac Enzymes: Recent Labs  Lab 03/30/17 1342  TROPONINI <0.03   BNP (last 3 results) Recent Labs    03/30/17 1343  BNP 37.0     CBG: Recent Labs  Lab 03/31/17 1636 03/31/17 1640 03/31/17 2122 04/01/17 0740 04/01/17 1140  GLUCAP 430* 442* 329* 250* 392*    Recent Results (from the past 240 hour(s))  Culture, blood (routine x 2)     Status: None (Preliminary result)   Collection Time: 03/30/17  1:43 PM  Result Value Ref Range Status   Specimen Description BLOOD BLOOD RIGHT FOREARM  Final   Special Requests   Final    BOTTLES DRAWN AEROBIC AND ANAEROBIC Blood Culture adequate volume   Culture   Final    NO GROWTH 2 DAYS Performed at [facility], [address], [place], [place] [address]    Report Status PENDING  Incomplete  Culture, blood (routine x 2)     Status: None (Preliminary result)   Collection Time: 03/30/17  1:43 PM  Result Value Ref Range Status   Specimen Description BLOOD RIGHT  ANTECUBITAL  Final   Special Requests   Final    BOTTLES DRAWN AEROBIC AND ANAEROBIC RIGHT ANTECUBITAL   Culture   Final    NO GROWTH 2 DAYS Performed at [facility], [address], [place], [place] [address]    Report Status PENDING  Incomplete     Studies: US Venous Img Lower Bilateral  Result Date: 03/31/2017 CLINICAL DATA:  Elevated D-dimer. Bilateral lower extremity pain. History of lung cancer. Evaluate for DVT. EXAM: BILATERAL LOWER EXTREMITY VENOUS DOPPLER ULTRASOUND TECHNIQUE: Gray-scale sonography with graded compression, as well as color Doppler and duplex ultrasound were performed to evaluate the lower extremity deep venous systems from the level of the common femoral vein and including the common femoral, femoral, profunda femoral, popliteal and calf veins including the posterior tibial, peroneal and gastrocnemius veins when visible. The superficial great saphenous vein was also interrogated. Spectral Doppler was utilized to evaluate flow at rest and with distal augmentation maneuvers in the common femoral, femoral and popliteal veins. COMPARISON:  None. FINDINGS: RIGHT LOWER EXTREMITY Common Femoral Vein: No evidence of thrombus. Normal compressibility, respiratory phasicity and response to augmentation. Saphenofemoral Junction: No evidence of thrombus. Normal compressibility and flow on color Doppler imaging. Profunda Femoral Vein: No evidence of thrombus. Normal compressibility and flow on color Doppler imaging. Femoral Vein: No evidence of thrombus. Normal compressibility, respiratory phasicity and response to augmentation. Popliteal Vein: No evidence of thrombus. Normal compressibility, respiratory phasicity and response to augmentation. Calf Veins: No evidence of thrombus. Normal compressibility and flow on color Doppler imaging. Superficial Great Saphenous Vein: No evidence of thrombus. Normal compressibility. Venous Reflux:  None. Other Findings:  None. LEFT LOWER  EXTREMITY Common Femoral Vein: No evidence of thrombus. Normal compressibility, respiratory phasicity and response to augmentation. Saphenofemoral Junction: No evidence of thrombus. Normal compressibility and flow on color Doppler imaging. Profunda Femoral Vein: No evidence of thrombus. Normal compressibility and flow on color Doppler imaging. Femoral Vein: No evidence of thrombus. Normal compressibility, respiratory phasicity and response to augmentation. Popliteal Vein: No evidence of thrombus. Normal compressibility, respiratory phasicity and response to augmentation. Calf Veins: No evidence of thrombus. Normal compressibility and flow on color Doppler imaging. Superficial Great Saphenous Vein: No evidence of thrombus. Normal compressibility. Venous Reflux:  None. Other Findings:  None. IMPRESSION: No evidence of DVT within either lower extremity. Electronically Signed   By: Sandi Mariscal M.D.   On: 03/31/2017 11:21    Scheduled Meds: . albuterol  2.5 mg Nebulization Q6H  . amoxicillin-clavulanate  1 tablet Oral BID  . atorvastatin  20 mg Oral QHS  . budesonide (PULMICORT) nebulizer solution  0.5 mg Nebulization BID  . gabapentin  300 mg Oral BID  . heparin  5,000 Units Subcutaneous Q8H  . insulin aspart  0-5 Units Subcutaneous QHS  . insulin aspart  0-9 Units Subcutaneous TID WC  . insulin aspart  5 Units Subcutaneous TID WC  . [START ON 04/02/2017] insulin glargine  15 Units Subcutaneous Daily  . isosorbide mononitrate  30 mg Oral Daily  . magnesium oxide  400 mg Oral BID  . mouth rinse  15 mL Mouth Rinse BID  . methylPREDNISolone (SOLU-MEDROL) injection  40 mg Intravenous Daily  . metoprolol tartrate  25 mg Oral BID  . pantoprazole  40 mg Oral Daily  . sodium chloride flush  3 mL Intravenous Q12H   Continuous Infusions: . sodium chloride      Assessment/Plan:  1. Acute on chronic hypoxic respiratory failure.  Continue oxygen supplementation and try to taper as much as  possible. 2. COPD exacerbation.  Decreased Solu-Medrol to 40 mg IV daily.  Nebulizer treatments with budesonide and albuterol 3. Acute kidney injury on chronic kidney disease stage III.  Creatinine down to 1.32 4. Anemia.  Guaiac stools.  Ferritin elevated at 407 could be of chronic disease. Check hemoglobin tomorrow.  Type and cross for tomorrow.  Hemoglobin stable at 7.7. 5. Uncontrolled diabetes with steroids.  Start Lantus and sliding scale.  Decreased dose of steroids. 6. Weakness.  Physical therapy evaluation appreciated.  Physical therapy recommended rehab.  Patient hoping to get stronger so she can go home. 7. Hyperlipidemia unspecified on atorvastatin 8. Diabetic neuropathy on gabapentin 9. GERD on Protonix  Code Status:     Code Status Orders  (From admission, onward)        Start     Ordered   03/30/17 1959  Full code  Continuous     03/30/17 1959    Code Status History    Date Active Date Inactive Code Status Order ID Comments User Context   07/15/2016 04:03 07/16/2016 18:58 Full Code [id number]  Harrie Foreman, MD Inpatient   02/05/2016 15:10 02/13/2016 18:11 Full Code [id number]  Henreitta Leber, MD Inpatient   01/27/2015 21:48 01/29/2015 17:59 Full Code [id number]  Lytle Butte, MD ED   10/24/2014 12:03 10/25/2014 16:26 Full Code [id number]  Algernon Huxley, MD Inpatient   08/30/2014 01:05 08/30/2014 19:48 Full Code [id number]  Lance Coon, MD Inpatient   10/19/2013 16:19 10/20/2013 16:04 Full Code [id number]  Newman Pies, MD Inpatient      Disposition Plan: Evaluate on a daily basis at this point  Time spent: 27 minutes.  Spoke with daughter and sister at bedside.  Jayson Waterhouse [employer]

## 2017-04-01 NOTE — Clinical Social Work Note (Signed)
Clinical Social Work Assessment  Patient Details  Name: [name] [name] MRN: [id number] Date of Birth: 14-Jun-1940  Date of referral:  04/01/17               Reason for consult:  Facility Placement                Permission sought to share information with:  Chartered certified accountant granted to share information::  Yes, Verbal Permission Granted  Name::      [name] [name]::   [place]   Relationship::     Contact Information:     Housing/Transportation Living arrangements for the past 2 months:  [place] of Information:  Patient Patient Interpreter Needed:  None Criminal Activity/Legal Involvement Pertinent to Current Situation/Hospitalization:  No - Comment as needed Significant Relationships:  Spouse Lives with:  Spouse Do you feel safe going back to the place where you live?  Yes Need for family participation in patient care:  Yes (Comment)  Care giving concerns:  Patient lives with her husband [name] [name] in [place].    Social Worker assessment / plan:  Holiday representative (CSW) reviewed chart and noted that PT is recommending SNF. CSW met with patient alone at bedside to discuss D/C plan. Patient was alert and oriented X4 and was laying in the bed. CSW introduced self and explained role of CSW department. Patient reported that she lives in [place] with her husband [name] [name]. CSW explained SNF process. Patient is agreeable to SNF search in [facility]. CSW presented bed offers and patient chose [facility].   PT is now recommending home health. Patient did much better with PT this afternoon. Patient is agreeable to go home with home health. RN case manager aware of above. Please reconsult if future social work needs arise. CSW signing off.   Employment status:  Retired Nurse, adult PT Recommendations:  Home with [employer], [place] / Referral to community  resources:  [place]  Patient/Family's Response to care:  Patient is agreeable to D/C home.   Patient/Family's Understanding of and Emotional Response to Diagnosis, Current Treatment, and Prognosis:  Patient was very pleasant and thanked CSW for assistance.   Emotional Assessment Appearance:  Appears stated age Attitude/Demeanor/Rapport:    Affect (typically observed):  Accepting, Adaptable, Pleasant Orientation:  Oriented to Self, Oriented to Place, Oriented to  Time, Oriented to Situation Alcohol / Substance use:  Not Applicable Psych involvement (Current and /or in the community):  No (Comment)  Discharge Needs  Concerns to be addressed:  Discharge Planning Concerns Readmission within the last 30 days:  No Current discharge risk:  Dependent with Mobility Barriers to Discharge:  Continued Medical Work up   [employer], Veronia Beets, LCSW 04/01/2017, 5:06 PM

## 2017-04-01 NOTE — Care Management Important Message (Signed)
Important Message  Patient Details  Name: [name] [name] MRN: [id number] Date of Birth: November 20, 1940   Medicare Important Message Given:  Yes    Shelbie Ammons, RN 04/01/2017, 7:12 AM

## 2017-04-02 LAB — GLUCOSE, CAPILLARY
GLUCOSE-CAPILLARY: 238 mg/dL — AB (ref 65–99)
GLUCOSE-CAPILLARY: 206 mg/dL — AB (ref 65–99)
GLUCOSE-CAPILLARY: 369 mg/dL — AB (ref 65–99)
Glucose-Capillary: 234 mg/dL — ABNORMAL HIGH (ref 65–99)
Glucose-Capillary: 389 mg/dL — ABNORMAL HIGH (ref 65–99)

## 2017-04-02 LAB — CBC
HCT: 23.3 % — ABNORMAL LOW (ref 35.0–47.0)
Hemoglobin: 8 g/dL — ABNORMAL LOW (ref 12.0–16.0)
MCH: 32 pg (ref 26.0–34.0)
MCHC: 34.2 g/dL (ref 32.0–36.0)
MCV: 93.6 fL (ref 80.0–100.0)
PLATELETS: 256 10*3/uL (ref 150–440)
RBC: 2.49 MIL/uL — ABNORMAL LOW (ref 3.80–5.20)
RDW: 15 % — AB (ref 11.5–14.5)
WBC: 7.9 10*3/uL (ref 3.6–11.0)

## 2017-04-02 LAB — TYPE AND SCREEN
ABO/RH(D): O POS
ANTIBODY SCREEN: NEGATIVE

## 2017-04-02 MED ORDER — PREDNISONE 20 MG PO TABS
40.0000 mg | ORAL_TABLET | Freq: Every day | ORAL | Status: DC
  Administered 2017-04-03: 40 mg via ORAL
  Filled 2017-04-02: qty 2

## 2017-04-02 NOTE — Progress Notes (Signed)
Inpatient Diabetes Program Recommendations  AACE/ADA: New Consensus Statement on Inpatient Glycemic Control (2015)  Target Ranges:  Prepandial:   less than 140 mg/dL      Peak postprandial:   less than 180 mg/dL (1-2 hours)      Critically ill patients:  140 - 180 mg/dL   Lab Results  Component Value Date   GLUCAP 206 (H) 04/02/2017   HGBA1C 6.6 12/29/2016    Review of Glycemic Control  Results for [name] [name], [name] [name] (MRN [id number]) as of 04/02/2017 08:46  Ref. Range 04/01/2017 16:56 04/01/2017 20:25 04/01/2017 21:00 04/02/2017 02:56 04/02/2017 07:30  Glucose-Capillary Latest Ref Range: 65 - 99 mg/dL 282 (H) 409 (H) 448 (H) 238 (H) 206 (H)    History:Type 2   Home DM Meds:Amaryl 2 mg BID  Current Insulin Orders:Novolog Sensitive Correction Scale/ SSI (0-15 units) TID AC + HS                                       Lantus 15 units daily- to start today                                       Novolog 5 units TID with meal  * Solumedrol reduced to 40 mg daily.  Medications increased - will begin today.Agree with current medications for blood sugar management.    Gentry Fitz, RN, BA, MHA, CDE Diabetes Coordinator Inpatient Diabetes Program  [phone] (Team Pager) [phone] ([place]) 04/02/2017 8:50 AM

## 2017-04-02 NOTE — Progress Notes (Signed)
Physical Therapy Treatment Patient Details Name: [name] [name] MRN: [id number] DOB: 21-Feb-1941 Today's Date: 04/02/2017    History of Present Illness 76 yo female with onset of acute respiratory failure and hypoxia, with COPD exacerbation and AKI, was admitted with anemia.  Her PMH:  PN, DM, lung CA, stroke, PNA,     PT Comments    Pt agreeable to PT; no voiced complaints. Pt demonstrated up/down steps this session with Min guard and education on step to fwd and side for improved confidence descending. Pt noted to ambulate mildly slower this date with occasional decreased foot clearance B; no tripping or LOB. Continue PT to progress strength and endurance to improve functional mobility and allow for a safe return home.    Follow Up Recommendations  Home health PT     Equipment Recommendations  None recommended by PT    Recommendations for Other Services       Precautions / Restrictions Precautions Precautions: Fall Restrictions Weight Bearing Restrictions: No    Mobility  Bed Mobility Overal bed mobility: Modified Independent                Transfers Overall transfer level: Needs assistance Equipment used: Rolling walker (2 wheeled);1 person hand held assist Transfers: Sit to/from Stand Sit to Stand: Supervision         General transfer comment: cues to avoid tangling in O2 tubing  Ambulation/Gait Ambulation/Gait assistance: Min guard Ambulation Distance (Feet): 100 Feet Assistive device: Rolling walker (2 wheeled);1 person hand held assist     Gait velocity interpretation: Below normal speed for age/gender General Gait Details: slower gait this session with less clearance of B feet; no tripping/LOB   Stairs Stairs: Yes   Stair Management: Two rails;Step to pattern;Forwards Number of Stairs: 6 General stair comments: Pt notes less comfort with balance descending steps; educated on sideways  Wheelchair Mobility    Modified Rankin (Stroke Patients  Only)       Balance                                            Cognition Arousal/Alertness: Awake/alert Behavior During Therapy: WFL for tasks assessed/performed Overall Cognitive Status: Within Functional Limits for tasks assessed                                        Exercises      General Comments        Pertinent Vitals/Pain Pain Assessment: No/denies pain    Home Living                      Prior Function            PT Goals (current goals can now be found in the care plan section) Progress towards PT goals: Progressing toward goals    Frequency    Min 2X/week      PT Plan Current plan remains appropriate    Co-evaluation              AM-PAC PT "6 Clicks" Daily Activity  Outcome Measure  Difficulty turning over in bed (including adjusting bedclothes, sheets and blankets)?: None Difficulty moving from lying on back to sitting on the side of the bed? : None Difficulty sitting down on and standing  up from a chair with arms (e.g., wheelchair, bedside commode, etc,.)?: A Little Help needed moving to and from a bed to chair (including a wheelchair)?: A Little Help needed walking in hospital room?: A Little Help needed climbing 3-5 steps with a railing? : A Little 6 Click Score: 20    End of Session Equipment Utilized During Treatment: Gait belt;Oxygen Activity Tolerance: Patient limited by fatigue Patient left: in bed;with call bell/phone within reach;with bed alarm set   PT Visit Diagnosis: Unsteadiness on feet (R26.81);Muscle weakness (generalized) (M62.81);Difficulty in walking, not elsewhere classified (R26.2);Adult, failure to thrive (R62.7);Hemiplegia and hemiparesis Hemiplegia - Right/Left: Right Hemiplegia - dominant/non-dominant: Dominant     Time: 0932-6712 PT Time Calculation (min) (ACUTE ONLY): 16 min  Charges:  $Gait Training: 8-22 mins                    G Codes:        Larae Grooms, PTA 04/02/2017, 3:39 PM

## 2017-04-02 NOTE — Care Management Note (Signed)
Case Management Note  Patient Details  Name: [name] [name] MRN: [id number] Date of Birth: 1941-02-15  Subjective/Objective:    Admitted to [facility] with the diagnosis of acute respiratory failure. Lives with husband, [name] [phone]). Last seen Dr. Rosanna Randy 12/29/16. Prescriptions [facility] [place]. Home Health per [facility] last April 2018. No skilled facility. Home oxygen per [facility] last 8 years. Uses 3 liters at night. Wheelchair, 2 rolling walkers, 2 canes, and shower chair in the home. Takes care of all basic activities of daily living herself, doesn't drive. [place] [address] 2 times in September and 2 times in December. Fair-good appetite. Lost 12 pounds the last year. Husband will transport                Action/Plan: Physical therapy re-evaluation completed. Recommending home with home health and therapy. Would like [facility] again. Telephone call to Judson Roch at [facility]. Will see if they can take [name].    Expected Discharge Date:  04/01/17               Expected Discharge Plan:     In-House Referral:   yes  Discharge planning Services     Post Acute Care Choice:   yes Choice offered to:   patient  DME Arranged:    DME Agency:     HH Arranged:   yes [place] Agency:   [facility]  Status of Service:     If discussed at [employer], dates discussed:    Additional Comments:  Shelbie Ammons, RN MSN Care Management [phone] 04/02/2017, 10:28 AM

## 2017-04-02 NOTE — Progress Notes (Signed)
Patient ID: [name] [name], female   DOB: 1941-03-25, 76 y.o.   MRN: [id number]   [facility] PROGRESS NOTE  [name] [name] QMG:[id number] DOB: 06-05-1940 DOA: 03/30/2017 PCP: Jerrol Banana., MD  HPI/Subjective: Patient feeling better today than yesterday.  Still having some cough and shortness of breath and wheezing.  Having some ear pain.  Objective: Vitals:   04/02/17 1200 04/02/17 1244  BP:  139/62  Pulse:  (!) 109  Resp:    Temp:  98 F (36.7 C)  SpO2: (!) 83% 100%    Filed Weights   03/30/17 1252 03/30/17 2001  Weight: 66.2 kg (146 lb) 64 kg (141 lb 1.6 oz)    ROS: Review of Systems  Constitutional: Negative for chills and fever.  Eyes: Negative for blurred vision.  Respiratory: Positive for cough, shortness of breath and wheezing.   Cardiovascular: Negative for chest pain.  Gastrointestinal: Negative for abdominal pain, constipation, diarrhea, nausea and vomiting.  Genitourinary: Negative for dysuria.  Musculoskeletal: Negative for joint pain.  Neurological: Negative for dizziness and headaches.   Exam: Physical Exam  HENT:  Nose: No mucosal edema.  Mouth/Throat: No oropharyngeal exudate or posterior oropharyngeal edema.  Left tympanic membrane bulging and cloudy.  Eyes: Conjunctivae, EOM and lids are normal. Pupils are equal, round, and reactive to light.  Neck: No JVD present. Carotid bruit is not present. No edema present. No thyroid mass and no thyromegaly present.  Cardiovascular: S1 normal and S2 normal. Exam reveals no gallop.  No murmur heard. Pulses:      Dorsalis pedis pulses are 2+ on the right side, and 2+ on the left side.  Respiratory: No respiratory distress. She has decreased breath sounds in the right middle field, the right lower field, the left middle field and the left lower field. She has wheezes in the right middle field, the right lower field, the left middle field and the left lower field. She has no rhonchi. She has no rales.   GI: Soft. Bowel sounds are normal. There is no tenderness.  Musculoskeletal:       Right shoulder: She exhibits no swelling.  Lymphadenopathy:    She has no cervical adenopathy.  Neurological: She is alert. No cranial nerve deficit.  Skin: Skin is warm. No rash noted. Nails show no clubbing.  Psychiatric: She has a normal mood and affect.      Data Reviewed: Basic Metabolic Panel: Recent Labs  Lab 03/30/17 1342 03/31/17 0423 04/01/17 1157  NA 136 135 134*  K 4.4 4.4 4.5  CL 101 103 99*  CO2 28 26 27   GLUCOSE 194* 314* 369*  BUN 27* 25* 24*  CREATININE 1.77* 1.39* 1.32*  CALCIUM 8.7* 8.1* 8.8*  MG 2.2  --   --    Liver Function Tests: Recent Labs  Lab 03/30/17 1342  AST 34  ALT 36  ALKPHOS 83  BILITOT 0.7  PROT 7.4  ALBUMIN 3.4*   CBC: Recent Labs  Lab 03/30/17 1342 03/31/17 0423 04/01/17 1157 04/02/17 0510  WBC 5.0 4.0 11.9* 7.9  NEUTROABS 3.9  --   --   --   HGB 8.9* 7.6* 7.7* 8.0*  HCT 26.7* 22.7* 24.0* 23.3*  MCV 93.8 93.3 93.6 93.6  PLT 278 231 278 256   Cardiac Enzymes: Recent Labs  Lab 03/30/17 1342  TROPONINI <0.03   BNP (last 3 results) Recent Labs    03/30/17 1343  BNP 37.0     CBG: Recent Labs  Lab  04/01/17 2025 04/01/17 2100 04/02/17 0256 04/02/17 0730 04/02/17 1155  GLUCAP 409* 448* 238* 206* 369*    Recent Results (from the past 240 hour(s))  Culture, blood (routine x 2)     Status: None (Preliminary result)   Collection Time: 03/30/17  1:43 PM  Result Value Ref Range Status   Specimen Description BLOOD BLOOD RIGHT FOREARM  Final   Special Requests   Final    BOTTLES DRAWN AEROBIC AND ANAEROBIC Blood Culture adequate volume   Culture   Final    NO GROWTH 3 DAYS Performed at [facility], [address] [place], [place] [address]    Report Status PENDING  Incomplete  Culture, blood (routine x 2)     Status: None (Preliminary result)   Collection Time: 03/30/17  1:43 PM  Result Value Ref Range Status    Specimen Description BLOOD RIGHT ANTECUBITAL  Final   Special Requests   Final    BOTTLES DRAWN AEROBIC AND ANAEROBIC RIGHT ANTECUBITAL   Culture   Final    NO GROWTH 3 DAYS Performed at [facility], [address], [place], [place] [address]    Report Status PENDING  Incomplete     Studies: No results found.  Scheduled Meds: . albuterol  2.5 mg Nebulization Q6H  . amoxicillin-clavulanate  1 tablet Oral BID  . atorvastatin  20 mg Oral QHS  . budesonide (PULMICORT) nebulizer solution  0.5 mg Nebulization BID  . gabapentin  300 mg Oral BID  . heparin  5,000 Units Subcutaneous Q8H  . insulin aspart  0-15 Units Subcutaneous TID AC & HS  . insulin aspart  5 Units Subcutaneous TID WC  . insulin glargine  15 Units Subcutaneous Daily  . isosorbide mononitrate  30 mg Oral Daily  . magnesium oxide  400 mg Oral BID  . mouth rinse  15 mL Mouth Rinse BID  . metoprolol tartrate  25 mg Oral BID  . pantoprazole  40 mg Oral Daily  . [START ON 04/03/2017] predniSONE  40 mg Oral Q breakfast  . sodium chloride flush  3 mL Intravenous Q12H   Continuous Infusions: . sodium chloride      Assessment/Plan:  1. Acute hypoxic respiratory failure.  Patient states she only wears oxygen at night.  Patient's pulse ox dropped down into the 80s with ambulation.  Will watch again overnight and evaluate tomorrow. 2. COPD exacerbation.  Change to prednisone for tomorrow.  Continue nebulizer treatments with budesonide and albuterol 3. Acute kidney injury on chronic kidney disease stage III.  Creatinine down to 1.32 4. Anemia.  Guaiac stools.  Ferritin elevated at 407 could be of chronic disease. Check hemoglobin tomorrow.  Type and cross for tomorrow.  Hemoglobin stable at 8.0. 5. Uncontrolled diabetes with steroids.  Continue Lantus 15units and 5 units prior to meals. 6. Weakness.  Physical therapy evaluation appreciated.  Physical therapy recommended home with home health now. 7. Hyperlipidemia  unspecified on atorvastatin 8. Diabetic neuropathy on gabapentin 9. GERD on Protonix  Code Status:     Code Status Orders  (From admission, onward)        Start     Ordered   03/30/17 1959  Full code  Continuous     03/30/17 1959    Code Status History    Date Active Date Inactive Code Status Order ID Comments User Context   07/15/2016 04:03 07/16/2016 18:58 Full Code [id number]  Harrie Foreman, MD Inpatient   02/05/2016 15:10 02/13/2016  18:11 Full Code [id number]  Henreitta Leber, MD Inpatient   01/27/2015 21:48 01/29/2015 17:59 Full Code [id number]  Lytle Butte, MD ED   10/24/2014 12:03 10/25/2014 16:26 Full Code [id number]  Algernon Huxley, MD Inpatient   08/30/2014 01:05 08/30/2014 19:48 Full Code [id number]  Lance Coon, MD Inpatient   10/19/2013 16:19 10/20/2013 16:04 Full Code [id number]  Newman Pies, MD Inpatient      Disposition Plan: Evaluate on a daily basis at this point  Time spent: 26 minutes.  Spoke with daughter and sister at bedside.  Omaira Mellen [employer]

## 2017-04-03 LAB — BASIC METABOLIC PANEL
Anion gap: 8 (ref 5–15)
BUN: 26 mg/dL — AB (ref 6–20)
CHLORIDE: 99 mmol/L — AB (ref 101–111)
CO2: 30 mmol/L (ref 22–32)
Calcium: 8.9 mg/dL (ref 8.9–10.3)
Creatinine, Ser: 1.26 mg/dL — ABNORMAL HIGH (ref 0.44–1.00)
GFR calc Af Amer: 47 mL/min — ABNORMAL LOW (ref >60)
GFR calc non Af Amer: 41 mL/min — ABNORMAL LOW (ref >60)
GLUCOSE: 179 mg/dL — AB (ref 65–99)
POTASSIUM: 4.4 mmol/L (ref 3.5–5.1)
SODIUM: 137 mmol/L (ref 135–145)

## 2017-04-03 LAB — GLUCOSE, CAPILLARY
GLUCOSE-CAPILLARY: 193 mg/dL — AB (ref 65–99)
Glucose-Capillary: 161 mg/dL — ABNORMAL HIGH (ref 65–99)

## 2017-04-03 LAB — URINE CULTURE

## 2017-04-03 LAB — HEMOGLOBIN: Hemoglobin: 8.4 g/dL — ABNORMAL LOW (ref 12.0–16.0)

## 2017-04-03 MED ORDER — HEPARIN SOD (PORK) LOCK FLUSH 100 UNIT/ML IV SOLN
INTRAVENOUS | Status: AC
  Filled 2017-04-03: qty 5

## 2017-04-03 MED ORDER — PREDNISONE 10 MG PO TABS: ORAL_TABLET | ORAL | 0 refills | Status: DC

## 2017-04-03 MED ORDER — ALBUTEROL SULFATE (5 MG/ML) 0.5% IN NEBU: 2.5000 mg | INHALATION_SOLUTION | Freq: Four times a day (QID) | RESPIRATORY_TRACT | 0 refills | Status: DC | PRN

## 2017-04-03 MED ORDER — AMOXICILLIN-POT CLAVULANATE 500-125 MG PO TABS: 1.0000 | ORAL_TABLET | Freq: Two times a day (BID) | ORAL | 0 refills | Status: DC

## 2017-04-03 NOTE — Progress Notes (Signed)
Discharge instructions given and went over with patient and patients daughter at bedside. Prescriptions given and reviewed. All questions answered. Patient discharged home with home health via wheelchair by nursing staff. Madlyn Frankel, RN

## 2017-04-03 NOTE — Discharge Instructions (Signed)
Chronic Obstructive Pulmonary Disease Exacerbation  Chronic obstructive pulmonary disease (COPD) is a common lung problem. In COPD, the flow of air from the lungs is limited. COPD exacerbations are times that breathing gets worse and you need extra treatment. Without treatment they can be life threatening. If they happen often, your lungs can become more damaged. If your COPD gets worse, your doctor may treat you with:  ? Medicines.  ? Oxygen.  ? Different ways to clear your airway, such as using a mask.    Follow these instructions at home:  ? Do not smoke.  ? Avoid tobacco smoke and other things that bother your lungs.  ? If given, take your antibiotic medicine as told. Finish the medicine even if you start to feel better.  ? Only take medicines as told by your doctor.  ? Drink enough fluids to keep your pee (urine) clear or pale yellow (unless your doctor has told you not to).  ? Use a cool mist machine (vaporizer).  ? If you use oxygen or a machine that turns liquid medicine into a mist (nebulizer), continue to use them as told.  ? Keep up with shots (vaccinations) as told by your doctor.  ? Exercise regularly.  ? Eat healthy foods.  ? Keep all doctor visits as told.  Get help right away if:  ? You are very short of breath and it gets worse.  ? You have trouble talking.  ? You have bad chest pain.  ? You have blood in your spit (sputum).  ? You have a fever.  ? You keep throwing up (vomiting).  ? You feel weak, or you pass out (faint).  ? You feel confused.  ? You keep getting worse.  This information is not intended to replace advice given to you by your health care provider. Make sure you discuss any questions you have with your health care provider.  Document Released: 03/12/2011 Document Revised: 08/29/2015 Document Reviewed: 11/25/2012  Elsevier Interactive Patient Education ? 2017 Elsevier Inc.

## 2017-04-03 NOTE — Discharge Summary (Signed)
[facility] NAME: [name] [name]    MR#:  [id number]  DATE OF BIRTH:  June 24, 1940  DATE OF ADMISSION:  03/30/2017 ADMITTING PHYSICIAN: Demetrios Loll, MD  DATE OF DISCHARGE: 04/03/2017 10:41 AM  PRIMARY CARE PHYSICIAN: Jerrol Banana., MD    ADMISSION DIAGNOSIS:  Chronic obstructive pulmonary disease with acute exacerbation (HCC) [J44.1]  DISCHARGE DIAGNOSIS:  Active Problems:   Acute respiratory failure with hypoxia (HCC)   Malnutrition of moderate degree   SECONDARY DIAGNOSIS:   Past Medical History:  Diagnosis Date  . Abnormal CT lung screening 10/17/2015  . COPD (chronic obstructive pulmonary disease) ([place])   . Coronary artery disease, non-occlusive    a. cath 2006: min nonobs CAD; b. cath 12/2010: cath LAD 50%, RCA 60%; c. 08/2013: Minimal luminal irregs, right dominant system with no significant CAD, diffuse luminal irregs noted. Normal EF 55%, no AS or MS.   . Diabetes mellitus   . Hyperlipemia    Followed by Dr. Rosanna [name]  . Hypertension   . Lung cancer ([place])   . Macular degeneration    rt  . Personal history of tobacco use, presenting hazards to health 10/15/2015  . Pneumonia    hx  . Shortness of breath   . Stroke [facility])     HOSPITAL COURSE:   1.  Acute hypoxic respiratory failure.  The patient states that she only wears oxygen at night.  The patient was on oxygen up until the day of discharge.  With ambulation her pulse ox remained above 89% at all times.  This means she is not a candidate for oxygen 24/7. 2.  COPD exacerbation.  Patient was on Solu-Medrol while here and switched over to prednisone taper upon discharge home.  Can go back on her usual inhalers. 3.  Acute kidney injury on chronic kidney disease stage III.  Creatinine down to 1.26. 4.  Anemia.  Ferritin levels elevated at 407 which could be anemia of chronic disease.  Hemoglobin up to 8.4 upon discharge home. 5.  Uncontrolled diabetes secondary to  steroids.  We had the patient on Lantus insulin here.  The patient does not want to do insulin at home and would like to go back on her Amaryl.  Since I will do a quick taper this should be okay to do. 6.  Weakness.  Physical therapy recommends home with home health 7.  Hyperlipidemia unspecified on atorvastatin 8.  Diabetic neuropathy on gabapentin 9.  GERD on Protonix 10.  Otitis media on Augmentin just to see  DISCHARGE CONDITIONS:   Satisfactory  CONSULTS OBTAINED:  None  DRUG ALLERGIES:   Allergies  Allergen Reactions  . Coconut Fatty Acids Swelling    Throat swells    DISCHARGE MEDICATIONS:   Allergies as of 04/03/2017      Reactions   Coconut Fatty Acids Swelling   Throat swells      Medication List    STOP taking these medications   triamcinolone cream 0.1 % Commonly known as:  KENALOG     TAKE these medications   albuterol (5 MG/ML) 0.5% nebulizer solution Commonly known as:  PROVENTIL Take 0.5 mLs (2.5 mg total) by nebulization every 6 (six) hours as needed for wheezing or shortness of breath. What changed:    when to take this  reasons to take this   amoxicillin-clavulanate 500-125 MG tablet Commonly known as:  AUGMENTIN Take 1 tablet (500 mg total) by mouth 2 (two) times  daily.   atorvastatin 40 MG tablet Commonly known as:  LIPITOR Take 0.5 tablets (20 mg total) by mouth at bedtime.   budesonide-formoterol 160-4.5 MCG/ACT inhaler Commonly known as:  SYMBICORT Inhale 2 puffs into the lungs 2 (two) times daily.   diazepam 5 MG tablet Commonly known as:  VALIUM Take 1 tablet (5 mg total) by mouth every 12 (twelve) hours as needed for muscle spasms.   gabapentin 600 MG tablet Commonly known as:  NEURONTIN Take 0.5 tablets (300 mg total) by mouth 2 (two) times daily.   glimepiride 2 MG tablet Commonly known as:  AMARYL Take 1 tablet (2 mg total) by mouth 2 (two) times daily.   HYDROcodone-acetaminophen 5-325 MG tablet Commonly known as:   NORCO Take 1 tablet by mouth every 6 (six) hours as needed for moderate pain or severe pain.   ICAPS Caps Take 2 capsules by mouth daily. Reported on 04/17/2015   isosorbide mononitrate 30 MG 24 hr tablet Commonly known as:  IMDUR TAKE 1 TABLET BY MOUTH  DAILY What changed:  Another medication with the same name was removed. Continue taking this medication, and follow the directions you see here.   magnesium oxide 400 (241.3 Mg) MG tablet Commonly known as:  MAG-OX Take 1 tablet by mouth 2 (two) times daily.   metoprolol tartrate 25 MG tablet Commonly known as:  LOPRESSOR Take 1 tablet (25 mg total) by mouth 2 (two) times daily.   mometasone 0.1 % cream Commonly known as:  ELOCON Apply small amount nightly to each ear   nitroGLYCERIN 0.4 MG SL tablet Commonly known as:  NITROSTAT Place 1 tablet (0.4 mg total) under the tongue every 5 (five) minutes as needed for chest pain.   nystatin cream Commonly known as:  MYCOSTATIN Apply 1 application topically 2 (two) times daily.   ondansetron 8 MG tablet Commonly known as:  ZOFRAN Take 1 tablet (8 mg total) by mouth every 8 (eight) hours as needed for nausea or vomiting.   ONE TOUCH ULTRA TEST test strip Generic drug:  glucose blood USE TO CHECK BLOOD SUGAR ONCE DAILY   pantoprazole 40 MG tablet Commonly known as:  PROTONIX TAKE 1 TABLET BY MOUTH  DAILY   predniSONE 10 MG tablet Commonly known as:  DELTASONE Take 2 tabs po day1; 1 tab po day2; 1/2 tab po day3,4 Start taking on:  04/04/2017        DISCHARGE INSTRUCTIONS:   Follow-up with PMD 1 week  If you experience worsening of your admission symptoms, develop shortness of breath, life threatening emergency, suicidal or homicidal thoughts you must seek medical attention immediately by calling 911 or calling your MD immediately  if symptoms less severe.  You Must read complete instructions/literature along with all the possible adverse reactions/side effects for all  the Medicines you take and that have been prescribed to you. Take any new Medicines after you have completely understood and accept all the possible adverse reactions/side effects.   Please note  You were cared for by a hospitalist during your hospital stay. If you have any questions about your discharge medications or the care you received while you were in the hospital after you are discharged, you can call the unit and asked to speak with the hospitalist on call if the hospitalist that took care of you is not available. Once you are discharged, your primary care physician will handle any further medical issues. Please note that NO REFILLS for any discharge medications will be  authorized once you are discharged, as it is imperative that you return to your primary care physician (or establish a relationship with a primary care physician if you do not have one) for your aftercare needs so that they can reassess your need for medications and monitor your lab values.    Today   CHIEF COMPLAINT:   Chief Complaint  Patient presents with  . Cough    HISTORY OF PRESENT ILLNESS:  [name] [name]  is a 76 y.o. female presented with cough and shortness of breath   VITAL SIGNS:  Blood pressure 125/43, heart rate 80, pulse ox 89% with ambulation, pulse ox 95% on room air at rest.  Respirations 16    PHYSICAL EXAMINATION:  GENERAL:  76 y.o.-year-old patient lying in the bed with no acute distress.  EYES: Pupils equal, round, reactive to light and accommodation. No scleral icterus. Extraocular muscles intact.  HEENT: Head atraumatic, normocephalic. Oropharynx and nasopharynx clear.  NECK:  Supple, no jugular venous distention. No thyroid enlargement, no tenderness.  LUNGS:  decreased breath sounds bilaterally, no wheezing, rales,rhonchi or crepitation. No use of accessory muscles of respiration.  CARDIOVASCULAR: S1, S2 normal. No murmurs, rubs, or gallops.  ABDOMEN: Soft, non-tender, non-distended.  Bowel sounds present. No organomegaly or mass.  EXTREMITIES: No pedal edema, cyanosis, or clubbing.  NEUROLOGIC: Cranial nerves II through XII are intact. Muscle strength 5/5 in all extremities. Sensation intact. Gait not checked.  PSYCHIATRIC: The patient is alert and oriented x 3.  SKIN: No obvious rash, lesion, or ulcer.   DATA REVIEW:   CBC Recent Labs  Lab 04/02/17 0510 04/03/17 0533  WBC 7.9  --   HGB 8.0* 8.4*  HCT 23.3*  --   PLT 256  --     Chemistries  Recent Labs  Lab 03/30/17 1342  04/03/17 0533  NA 136   < > 137  K 4.4   < > 4.4  CL 101   < > 99*  CO2 28   < > 30  GLUCOSE 194*   < > 179*  BUN 27*   < > 26*  CREATININE 1.77*   < > 1.26*  CALCIUM 8.7*   < > 8.9  MG 2.2  --   --   AST 34  --   --   ALT 36  --   --   ALKPHOS 83  --   --   BILITOT 0.7  --   --    < > = values in this interval not displayed.    Cardiac Enzymes Recent Labs  Lab 03/30/17 1342  TROPONINI <0.03    Microbiology Results  Results for orders placed or performed during the hospital encounter of 03/30/17  Culture, blood (routine x 2)     Status: None (Preliminary result)   Collection Time: 03/30/17  1:43 PM  Result Value Ref Range Status   Specimen Description BLOOD BLOOD RIGHT FOREARM  Final   Special Requests   Final    BOTTLES DRAWN AEROBIC AND ANAEROBIC Blood Culture adequate volume   Culture   Final    NO GROWTH 4 DAYS Performed at [facility], [address] [place], [place] [address]    Report Status PENDING  Incomplete  Culture, blood (routine x 2)     Status: None (Preliminary result)   Collection Time: 03/30/17  1:43 PM  Result Value Ref Range Status   Specimen Description BLOOD RIGHT ANTECUBITAL  Final   Special Requests  Final    BOTTLES DRAWN AEROBIC AND ANAEROBIC RIGHT ANTECUBITAL   Culture   Final    NO GROWTH 4 DAYS Performed at [facility], [address] [place], [place] [address]    Report Status PENDING  Incomplete  Urine  culture     Status: Abnormal   Collection Time: 04/01/17  2:00 PM  Result Value Ref Range Status   Specimen Description   Final    URINE, RANDOM Performed at [facility], [address], [place], [place] [address]    Special Requests   Final    NONE Performed at [facility], [address] [place], [place] [address]    Culture MULTIPLE SPECIES PRESENT, SUGGEST RECOLLECTION (A)  Final   Report Status 04/03/2017 FINAL  Final    URI  Management plans discussed with the patient,  and she is 11.  Otitis media on Augmentin in agreement.  CODE STATUS:  Code Status History    Date Active Date Inactive Code Status Order ID Comments User Context   03/30/2017 19:59 04/03/2017 14:02 Full Code [id number]  Demetrios Loll, MD Inpatient   07/15/2016 04:03 07/16/2016 18:58 Full Code [id number]  Harrie Foreman, MD Inpatient   02/05/2016 15:10 02/13/2016 18:11 Full Code [id number]  Henreitta Leber, MD Inpatient   01/27/2015 21:48 01/29/2015 17:59 Full Code [id number]  Lytle Butte, MD ED   10/24/2014 12:03 10/25/2014 16:26 Full Code [id number]  Algernon Huxley, MD Inpatient   08/30/2014 01:05 08/30/2014 19:48 Full Code [id number]  Lance Coon, MD Inpatient   10/19/2013 16:19 10/20/2013 16:04 Full Code [id number]  Newman Pies, MD Inpatient      TOTAL TIME TAKING CARE OF THIS PATIENT: 35 minutes.    Loletha Grayer M.D on 04/03/2017 at 4:09 PM  Between 7am to 6pm - Pager - [phone]  After 6pm go to [email or web address] - password [email or web address]  [phone]  CC: Primary care physician; Jerrol Banana., MD

## 2017-04-03 NOTE — Care Management Note (Signed)
Case Management Note  Patient Details  Name: [name] [name] MRN: [id number] Date of Birth: [birth year]  Subjective/Objective:   A referral for HH=PT, RN was called to [facility] at [facility] per patient choice. Discharge to home later today.                  Action/Plan:   Expected Discharge Date:  04/03/17               Expected Discharge Plan:  [place]  In-House Referral:     Discharge planning Services  CM Consult  Post Acute Care Choice:  Home Health Choice offered to:  Patient  DME Arranged:  N/A DME Agency:  NA  HH Arranged:  RN, PT HH Agency:  [facility]  Status of Service:  Completed, signed off  If discussed at [place] of Stay Meetings, dates discussed:    Additional Comments:  Kierston Plasencia A, RN 04/03/2017, 10:44 AM

## 2017-04-03 NOTE — Progress Notes (Signed)
SATURATION QUALIFICATIONS: (This note is used to comply with regulatory documentation for home oxygen)  Patient Saturations on Room Air at Rest = 95%  Patient Saturations on Room Air while Ambulating = 89%  Madlyn Frankel, RN

## 2017-04-04 LAB — CULTURE, BLOOD (ROUTINE X 2)
CULTURE: NO GROWTH
Culture: NO GROWTH
Special Requests: ADEQUATE

## 2017-04-07 ENCOUNTER — Telehealth: Payer: Self-pay | Admitting: Family Medicine

## 2017-04-07 ENCOUNTER — Telehealth: Payer: Self-pay

## 2017-04-07 NOTE — Telephone Encounter (Signed)
Christy with [facility] health wanted you to know they will start visiting Ms. Vanvoorhis tomorrow.

## 2017-04-07 NOTE — Telephone Encounter (Signed)
FYI

## 2017-04-07 NOTE — Telephone Encounter (Signed)
Patient's husband is requesting a order for a toilet seat riser with handles so insurance will pay. He states patient almost fell getting off the toilet since being home from the hospital. They need this ASAP. She has a hospital follow up scheduled for Monday. CB#[phone]

## 2017-04-08 DIAGNOSIS — Z9981 Dependence on supplemental oxygen: Secondary | ICD-10-CM | POA: Diagnosis not present

## 2017-04-08 DIAGNOSIS — N183 Chronic kidney disease, stage 3 (moderate): Secondary | ICD-10-CM | POA: Diagnosis not present

## 2017-04-08 DIAGNOSIS — J441 Chronic obstructive pulmonary disease with (acute) exacerbation: Secondary | ICD-10-CM | POA: Diagnosis not present

## 2017-04-08 DIAGNOSIS — Z87891 Personal history of nicotine dependence: Secondary | ICD-10-CM | POA: Diagnosis not present

## 2017-04-08 DIAGNOSIS — Z8673 Personal history of transient ischemic attack (TIA), and cerebral infarction without residual deficits: Secondary | ICD-10-CM | POA: Diagnosis not present

## 2017-04-08 DIAGNOSIS — Z7984 Long term (current) use of oral hypoglycemic drugs: Secondary | ICD-10-CM | POA: Diagnosis not present

## 2017-04-08 DIAGNOSIS — E1122 Type 2 diabetes mellitus with diabetic chronic kidney disease: Secondary | ICD-10-CM | POA: Diagnosis not present

## 2017-04-08 DIAGNOSIS — Z79891 Long term (current) use of opiate analgesic: Secondary | ICD-10-CM | POA: Diagnosis not present

## 2017-04-08 DIAGNOSIS — D631 Anemia in chronic kidney disease: Secondary | ICD-10-CM | POA: Diagnosis not present

## 2017-04-08 DIAGNOSIS — Z7951 Long term (current) use of inhaled steroids: Secondary | ICD-10-CM | POA: Diagnosis not present

## 2017-04-08 DIAGNOSIS — E1140 Type 2 diabetes mellitus with diabetic neuropathy, unspecified: Secondary | ICD-10-CM | POA: Diagnosis not present

## 2017-04-08 DIAGNOSIS — Z9181 History of falling: Secondary | ICD-10-CM | POA: Diagnosis not present

## 2017-04-08 DIAGNOSIS — I129 Hypertensive chronic kidney disease with stage 1 through stage 4 chronic kidney disease, or unspecified chronic kidney disease: Secondary | ICD-10-CM | POA: Diagnosis not present

## 2017-04-08 DIAGNOSIS — I2510 Atherosclerotic heart disease of native coronary artery without angina pectoris: Secondary | ICD-10-CM | POA: Diagnosis not present

## 2017-04-08 DIAGNOSIS — Z7952 Long term (current) use of systemic steroids: Secondary | ICD-10-CM | POA: Diagnosis not present

## 2017-04-08 DIAGNOSIS — Z85110 Personal history of malignant carcinoid tumor of bronchus and lung: Secondary | ICD-10-CM | POA: Diagnosis not present

## 2017-04-08 NOTE — Telephone Encounter (Signed)
Please advise. May need face to face??

## 2017-04-09 ENCOUNTER — Telehealth: Payer: Self-pay | Admitting: Family Medicine

## 2017-04-09 DIAGNOSIS — Z9981 Dependence on supplemental oxygen: Secondary | ICD-10-CM | POA: Diagnosis not present

## 2017-04-09 DIAGNOSIS — E1140 Type 2 diabetes mellitus with diabetic neuropathy, unspecified: Secondary | ICD-10-CM | POA: Diagnosis not present

## 2017-04-09 DIAGNOSIS — I2510 Atherosclerotic heart disease of native coronary artery without angina pectoris: Secondary | ICD-10-CM | POA: Diagnosis not present

## 2017-04-09 DIAGNOSIS — Z7984 Long term (current) use of oral hypoglycemic drugs: Secondary | ICD-10-CM | POA: Diagnosis not present

## 2017-04-09 DIAGNOSIS — Z85110 Personal history of malignant carcinoid tumor of bronchus and lung: Secondary | ICD-10-CM | POA: Diagnosis not present

## 2017-04-09 DIAGNOSIS — Z9181 History of falling: Secondary | ICD-10-CM | POA: Diagnosis not present

## 2017-04-09 DIAGNOSIS — Z87891 Personal history of nicotine dependence: Secondary | ICD-10-CM | POA: Diagnosis not present

## 2017-04-09 DIAGNOSIS — Z7951 Long term (current) use of inhaled steroids: Secondary | ICD-10-CM | POA: Diagnosis not present

## 2017-04-09 DIAGNOSIS — Z7952 Long term (current) use of systemic steroids: Secondary | ICD-10-CM | POA: Diagnosis not present

## 2017-04-09 DIAGNOSIS — N183 Chronic kidney disease, stage 3 (moderate): Secondary | ICD-10-CM | POA: Diagnosis not present

## 2017-04-09 DIAGNOSIS — I129 Hypertensive chronic kidney disease with stage 1 through stage 4 chronic kidney disease, or unspecified chronic kidney disease: Secondary | ICD-10-CM | POA: Diagnosis not present

## 2017-04-09 DIAGNOSIS — E1122 Type 2 diabetes mellitus with diabetic chronic kidney disease: Secondary | ICD-10-CM | POA: Diagnosis not present

## 2017-04-09 DIAGNOSIS — Z79891 Long term (current) use of opiate analgesic: Secondary | ICD-10-CM | POA: Diagnosis not present

## 2017-04-09 DIAGNOSIS — J441 Chronic obstructive pulmonary disease with (acute) exacerbation: Secondary | ICD-10-CM | POA: Diagnosis not present

## 2017-04-09 DIAGNOSIS — D631 Anemia in chronic kidney disease: Secondary | ICD-10-CM | POA: Diagnosis not present

## 2017-04-09 DIAGNOSIS — Z8673 Personal history of transient ischemic attack (TIA), and cerebral infarction without residual deficits: Secondary | ICD-10-CM | POA: Diagnosis not present

## 2017-04-09 NOTE — Telephone Encounter (Signed)
[name] [name] went out to see [name] [name] yesterday.  She has had a lot of falls recently and is having difficulty ambulating around.  Pt is going to assess patient.  She needs OT approved and a homehealth aid to help with bathing and a Education officer, museum approval to discuss advanced care planning as the family does not have any of that.     You will be seeing [name] [name] on Monday 04/12/17 for an OV.  She needs an rx for a hospital bed, elevated toilet seat and a rolling walker w/ seat.   Somewhere in your OV note, it needs to state the patient is in the office today for evaluation  For home health care homebound and equipment for falls prevention plan.     [name] [name] said her regular bed is much to high for pt to get in and get out of and it cannot be modified to prevent falls.

## 2017-04-12 ENCOUNTER — Telehealth: Payer: Self-pay

## 2017-04-12 ENCOUNTER — Ambulatory Visit (INDEPENDENT_AMBULATORY_CARE_PROVIDER_SITE_OTHER): Payer: Medicare Other | Admitting: Family Medicine

## 2017-04-12 ENCOUNTER — Encounter: Payer: Self-pay | Admitting: Family Medicine

## 2017-04-12 VITALS — BP 122/60 | HR 72 | Temp 97.9°F | Resp 16 | Wt 143.0 lb

## 2017-04-12 DIAGNOSIS — I10 Essential (primary) hypertension: Secondary | ICD-10-CM

## 2017-04-12 DIAGNOSIS — Z09 Encounter for follow-up examination after completed treatment for conditions other than malignant neoplasm: Secondary | ICD-10-CM

## 2017-04-12 DIAGNOSIS — C3411 Malignant neoplasm of upper lobe, right bronchus or lung: Secondary | ICD-10-CM

## 2017-04-12 DIAGNOSIS — R296 Repeated falls: Secondary | ICD-10-CM

## 2017-04-12 DIAGNOSIS — I6523 Occlusion and stenosis of bilateral carotid arteries: Secondary | ICD-10-CM | POA: Diagnosis not present

## 2017-04-12 DIAGNOSIS — J441 Chronic obstructive pulmonary disease with (acute) exacerbation: Secondary | ICD-10-CM

## 2017-04-12 DIAGNOSIS — R531 Weakness: Secondary | ICD-10-CM | POA: Diagnosis not present

## 2017-04-12 MED ORDER — NITROGLYCERIN 0.4 MG SL SUBL: 0.4000 mg | SUBLINGUAL_TABLET | SUBLINGUAL | 3 refills | Status: DC | PRN

## 2017-04-12 NOTE — Patient Instructions (Signed)
Take 1/2 gabapentin

## 2017-04-12 NOTE — Telephone Encounter (Signed)
Candyce Churn from [facility] called requesting verbal order for PT for 6 weeks for gait, home safety, falls prevention. CB # [phone].

## 2017-04-12 NOTE — Telephone Encounter (Signed)
Please advise-Evertte Sones V Edward Guthmiller, RMA  

## 2017-04-12 NOTE — Telephone Encounter (Signed)
ok 

## 2017-04-12 NOTE — Telephone Encounter (Signed)
Elenor Legato, RMA

## 2017-04-12 NOTE — Progress Notes (Signed)
Patient: [name] [name] Female    DOB: 1941/02/28   77 y.o.   MRN: [id number] Visit Date: 04/12/2017  Today's Provider: [name], MD   Chief Complaint  Patient presents with  . Hospitalization Follow-up   Subjective:    HPI Hospital stay- 12/25/-12/29  Dx: acute respiratory failure with hypoxia       malnutrition of moderate degree       COPD exacerbation       Acute kidney injury on chronic kidney Dz.       DM       Anemia       Weakness- suggest home health       Hyperlipidemia       GERD   Per pt message Anderson Malta from [facility] called stating that she went to the home of the pt and her bed is too high and can not be modified to prevent falls. She need OT approved and home health aid to help with bathing and social worker to discuss advanced care plan as the family does not have any of that. She needs a rx for a hospital bed, elevated toilet seat and rolling walker with seat.   She does not need the hospital bed rx, she has someone that is giving her one. However they want a life chair. Pt reports that she is feeling ok, just weak and unstable. She would like to ask about her Gabapentin. The hospital told her that it may be causing some of her off balance.      Allergies  Allergen Reactions  . Coconut Fatty Acids Swelling    Throat swells     Current Outpatient Medications:  .  albuterol (PROVENTIL) (5 MG/ML) 0.5% nebulizer solution, Take 0.5 mLs (2.5 mg total) by nebulization every 6 (six) hours as needed for wheezing or shortness of breath., Disp: 100 vial, Rfl: 0 .  atorvastatin (LIPITOR) 40 MG tablet, Take 0.5 tablets (20 mg total) by mouth at bedtime., Disp: 90 tablet, Rfl: 3 .  budesonide-formoterol (SYMBICORT) 160-4.5 MCG/ACT inhaler, Inhale 2 puffs into the lungs 2 (two) times daily., Disp: 1 Inhaler, Rfl: 0 .  diazepam (VALIUM) 5 MG tablet, Take 1 tablet (5 mg total) by mouth every 12 (twelve) hours as needed for muscle spasms., Disp:  30 tablet, Rfl: 5 .  gabapentin (NEURONTIN) 600 MG tablet, Take 0.5 tablets (300 mg total) by mouth 2 (two) times daily., Disp: 60 tablet, Rfl: 2 .  glimepiride (AMARYL) 2 MG tablet, Take 1 tablet (2 mg total) by mouth 2 (two) times daily., Disp: 60 tablet, Rfl: 2 .  HYDROcodone-acetaminophen (NORCO) 5-325 MG tablet, Take 1 tablet by mouth every 6 (six) hours as needed for moderate pain or severe pain., Disp: 20 tablet, Rfl: 0 .  isosorbide mononitrate (IMDUR) 30 MG 24 hr tablet, TAKE 1 TABLET BY MOUTH  DAILY, Disp: 90 tablet, Rfl: 0 .  magnesium oxide (MAG-OX) 400 (241.3 MG) MG tablet, Take 1 tablet by mouth 2 (two) times daily., Disp: , Rfl:  .  metoprolol tartrate (LOPRESSOR) 25 MG tablet, Take 1 tablet (25 mg total) by mouth 2 (two) times daily., Disp: 180 tablet, Rfl: 3 .  mometasone (ELOCON) 0.1 % cream, Apply small amount nightly to each ear, Disp: 45 g, Rfl: 0 .  Multiple Vitamins-Minerals (ICAPS) CAPS, Take 2 capsules by mouth daily. Reported on 04/17/2015, Disp: , Rfl:  .  nitroGLYCERIN (NITROSTAT) 0.4 MG SL tablet, Place 1 tablet (  0.4 mg total) under the tongue every 5 (five) minutes as needed for chest pain., Disp: 25 tablet, Rfl: 3 .  nystatin cream (MYCOSTATIN), Apply 1 application topically 2 (two) times daily., Disp: 30 g, Rfl: 0 .  ondansetron (ZOFRAN) 8 MG tablet, Take 1 tablet (8 mg total) by mouth every 8 (eight) hours as needed for nausea or vomiting., Disp: 30 tablet, Rfl: 1 .  ONE TOUCH ULTRA TEST test strip, USE TO CHECK BLOOD SUGAR ONCE DAILY, Disp: 100 each, Rfl: 11 .  pantoprazole (PROTONIX) 40 MG tablet, TAKE 1 TABLET BY MOUTH  DAILY, Disp: 90 tablet, Rfl: 3 .  predniSONE (DELTASONE) 10 MG tablet, Take 2 tabs po day1; 1 tab po day2; 1/2 tab po day3,4, Disp: 5 tablet, Rfl: 0 .  amoxicillin-clavulanate (AUGMENTIN) 500-125 MG tablet, Take 1 tablet (500 mg total) by mouth 2 (two) times daily. (Patient not taking: Reported on 04/12/2017), Disp: 16 tablet, Rfl: 0 No current  facility-administered medications for this visit.   Facility-Administered Medications Ordered in Other Visits:  .  ondansetron (ZOFRAN) 8 mg, dexamethasone (DECADRON) 10 mg in sodium chloride 0.9 % 50 mL IVPB, , Intravenous, Once, Finnegan, Kathlene November, MD  Review of Systems  Constitutional: Positive for fatigue.  HENT: Positive for rhinorrhea.   Eyes: Negative.   Respiratory: Negative.   Cardiovascular: Negative.   Gastrointestinal: Negative.   Endocrine: Negative.   Genitourinary: Negative.   Musculoskeletal: Negative.   Skin: Negative.   Allergic/Immunologic: Negative.   Neurological: Positive for weakness.  Hematological: Negative.   Psychiatric/Behavioral: Negative.     Social History   Tobacco Use  . Smoking status: Former Smoker    Packs/day: 1.00    Years: 50.00    Pack years: 50.00    Types: Cigarettes    Last attempt to quit: 10/03/2015    Years since quitting: 1.5  . Smokeless tobacco: Never Used  . Tobacco comment: smokes 3 cigs daily 05/06/15. Pt instructed to quit.  Substance Use Topics  . Alcohol use: No    Alcohol/week: 0.0 oz   Objective:   BP 122/60 (BP Location: Left Arm, Patient Position: Sitting, Cuff Size: Normal)   Pulse 72   Temp 97.9 F (36.6 C) (Oral)   Resp 16   Wt 143 lb (64.9 kg)   SpO2 96%   BMI 26.16 kg/m  Vitals:   04/12/17 1549  BP: 122/60  Pulse: 72  Resp: 16  Temp: 97.9 F (36.6 C)  TempSrc: Oral  SpO2: 96%  Weight: 143 lb (64.9 kg)     Physical Exam  Constitutional: She is oriented to person, place, and time. She appears well-developed and well-nourished.  HENT:  Head: Normocephalic and atraumatic.  Eyes: Conjunctivae are normal.  Neck: Neck supple. No thyromegaly present.  Cardiovascular: Normal rate, regular rhythm and normal heart sounds.  Pulmonary/Chest: Effort normal and breath sounds normal. No respiratory distress.  Mild diffuse crackles/rhonchi.  Abdominal: Soft.  Lymphadenopathy:    She has no cervical  adenopathy.  Neurological: She is alert and oriented to person, place, and time.  Skin: Skin is warm and dry.  Psychiatric: She has a normal mood and affect. Her behavior is normal. Judgment and thought content normal.        Assessment & Plan:     1. Hospital discharge follow-up   2. COPD exacerbation ([place])   3. Frequent falls Pt needs a rx for a lift chair, elevated toilet seat, rolling walker with seat. Pt is on office today  for evaluation for home health care homebound and equipment for falls prevention.  5 falls in last month due to weakness. - Ambulatory referral to Physical Therapy - Ambulatory referral to Occupational Therapy  4. Bilateral carotid artery stenosis  - nitroGLYCERIN (NITROSTAT) 0.4 MG SL tablet; Place 1 tablet (0.4 mg total) under the tongue every 5 (five) minutes as needed for chest pain.  Dispense: 25 tablet; Refill: 3  5. Primary cancer of right upper lobe of lung ([place])   6. HYPERTENSION, BENIGN 7.ASCVD      HPI, Exam, and A&P Transcribed under the direction and in the presence of Sheetal Lyall L. Cranford Mon, MD  Electronically Signed: Katina Dung, CMA I have done the exam and reviewed the above chart and it is accurate to the best of my knowledge. Development worker, community has been used in this note in any air is in the dictation or transcription are unintentional.   [name], MD  [place]

## 2017-04-13 ENCOUNTER — Telehealth: Payer: Self-pay | Admitting: Family Medicine

## 2017-04-13 DIAGNOSIS — D631 Anemia in chronic kidney disease: Secondary | ICD-10-CM | POA: Diagnosis not present

## 2017-04-13 DIAGNOSIS — Z7951 Long term (current) use of inhaled steroids: Secondary | ICD-10-CM | POA: Diagnosis not present

## 2017-04-13 DIAGNOSIS — Z79891 Long term (current) use of opiate analgesic: Secondary | ICD-10-CM | POA: Diagnosis not present

## 2017-04-13 DIAGNOSIS — E1122 Type 2 diabetes mellitus with diabetic chronic kidney disease: Secondary | ICD-10-CM | POA: Diagnosis not present

## 2017-04-13 DIAGNOSIS — J441 Chronic obstructive pulmonary disease with (acute) exacerbation: Secondary | ICD-10-CM | POA: Diagnosis not present

## 2017-04-13 DIAGNOSIS — I2510 Atherosclerotic heart disease of native coronary artery without angina pectoris: Secondary | ICD-10-CM | POA: Diagnosis not present

## 2017-04-13 DIAGNOSIS — Z87891 Personal history of nicotine dependence: Secondary | ICD-10-CM | POA: Diagnosis not present

## 2017-04-13 DIAGNOSIS — I129 Hypertensive chronic kidney disease with stage 1 through stage 4 chronic kidney disease, or unspecified chronic kidney disease: Secondary | ICD-10-CM | POA: Diagnosis not present

## 2017-04-13 DIAGNOSIS — Z9981 Dependence on supplemental oxygen: Secondary | ICD-10-CM | POA: Diagnosis not present

## 2017-04-13 DIAGNOSIS — Z9181 History of falling: Secondary | ICD-10-CM | POA: Diagnosis not present

## 2017-04-13 DIAGNOSIS — Z7952 Long term (current) use of systemic steroids: Secondary | ICD-10-CM | POA: Diagnosis not present

## 2017-04-13 DIAGNOSIS — N183 Chronic kidney disease, stage 3 (moderate): Secondary | ICD-10-CM | POA: Diagnosis not present

## 2017-04-13 DIAGNOSIS — E1140 Type 2 diabetes mellitus with diabetic neuropathy, unspecified: Secondary | ICD-10-CM | POA: Diagnosis not present

## 2017-04-13 DIAGNOSIS — Z8673 Personal history of transient ischemic attack (TIA), and cerebral infarction without residual deficits: Secondary | ICD-10-CM | POA: Diagnosis not present

## 2017-04-13 DIAGNOSIS — Z7984 Long term (current) use of oral hypoglycemic drugs: Secondary | ICD-10-CM | POA: Diagnosis not present

## 2017-04-13 DIAGNOSIS — Z85110 Personal history of malignant carcinoid tumor of bronchus and lung: Secondary | ICD-10-CM | POA: Diagnosis not present

## 2017-04-13 NOTE — Telephone Encounter (Signed)
Please advise, what specific needs she has for Home health to come out? Order for PT and OT already put in separately V Hopkins, RMA

## 2017-04-13 NOTE — Telephone Encounter (Signed)
Please add services that are needed in the home health referral,Thanks

## 2017-04-14 DIAGNOSIS — Z9981 Dependence on supplemental oxygen: Secondary | ICD-10-CM | POA: Diagnosis not present

## 2017-04-14 DIAGNOSIS — Z7951 Long term (current) use of inhaled steroids: Secondary | ICD-10-CM | POA: Diagnosis not present

## 2017-04-14 DIAGNOSIS — Z85110 Personal history of malignant carcinoid tumor of bronchus and lung: Secondary | ICD-10-CM | POA: Diagnosis not present

## 2017-04-14 DIAGNOSIS — Z87891 Personal history of nicotine dependence: Secondary | ICD-10-CM | POA: Diagnosis not present

## 2017-04-14 DIAGNOSIS — Z8673 Personal history of transient ischemic attack (TIA), and cerebral infarction without residual deficits: Secondary | ICD-10-CM | POA: Diagnosis not present

## 2017-04-14 DIAGNOSIS — Z7952 Long term (current) use of systemic steroids: Secondary | ICD-10-CM | POA: Diagnosis not present

## 2017-04-14 DIAGNOSIS — Z9181 History of falling: Secondary | ICD-10-CM | POA: Diagnosis not present

## 2017-04-14 DIAGNOSIS — E1140 Type 2 diabetes mellitus with diabetic neuropathy, unspecified: Secondary | ICD-10-CM | POA: Diagnosis not present

## 2017-04-14 DIAGNOSIS — E1122 Type 2 diabetes mellitus with diabetic chronic kidney disease: Secondary | ICD-10-CM | POA: Diagnosis not present

## 2017-04-14 DIAGNOSIS — I129 Hypertensive chronic kidney disease with stage 1 through stage 4 chronic kidney disease, or unspecified chronic kidney disease: Secondary | ICD-10-CM | POA: Diagnosis not present

## 2017-04-14 DIAGNOSIS — I2510 Atherosclerotic heart disease of native coronary artery without angina pectoris: Secondary | ICD-10-CM | POA: Diagnosis not present

## 2017-04-14 DIAGNOSIS — Z7984 Long term (current) use of oral hypoglycemic drugs: Secondary | ICD-10-CM | POA: Diagnosis not present

## 2017-04-14 DIAGNOSIS — J441 Chronic obstructive pulmonary disease with (acute) exacerbation: Secondary | ICD-10-CM | POA: Diagnosis not present

## 2017-04-14 DIAGNOSIS — N183 Chronic kidney disease, stage 3 (moderate): Secondary | ICD-10-CM | POA: Diagnosis not present

## 2017-04-14 DIAGNOSIS — D631 Anemia in chronic kidney disease: Secondary | ICD-10-CM | POA: Diagnosis not present

## 2017-04-14 DIAGNOSIS — Z79891 Long term (current) use of opiate analgesic: Secondary | ICD-10-CM | POA: Diagnosis not present

## 2017-04-14 NOTE — Telephone Encounter (Signed)
error 

## 2017-04-15 ENCOUNTER — Telehealth: Payer: Self-pay | Admitting: Family Medicine

## 2017-04-15 DIAGNOSIS — Z79891 Long term (current) use of opiate analgesic: Secondary | ICD-10-CM | POA: Diagnosis not present

## 2017-04-15 DIAGNOSIS — Z7951 Long term (current) use of inhaled steroids: Secondary | ICD-10-CM | POA: Diagnosis not present

## 2017-04-15 DIAGNOSIS — E1122 Type 2 diabetes mellitus with diabetic chronic kidney disease: Secondary | ICD-10-CM | POA: Diagnosis not present

## 2017-04-15 DIAGNOSIS — Z7984 Long term (current) use of oral hypoglycemic drugs: Secondary | ICD-10-CM | POA: Diagnosis not present

## 2017-04-15 DIAGNOSIS — Z85110 Personal history of malignant carcinoid tumor of bronchus and lung: Secondary | ICD-10-CM | POA: Diagnosis not present

## 2017-04-15 DIAGNOSIS — D631 Anemia in chronic kidney disease: Secondary | ICD-10-CM | POA: Diagnosis not present

## 2017-04-15 DIAGNOSIS — I2510 Atherosclerotic heart disease of native coronary artery without angina pectoris: Secondary | ICD-10-CM | POA: Diagnosis not present

## 2017-04-15 DIAGNOSIS — Z7952 Long term (current) use of systemic steroids: Secondary | ICD-10-CM | POA: Diagnosis not present

## 2017-04-15 DIAGNOSIS — Z9181 History of falling: Secondary | ICD-10-CM | POA: Diagnosis not present

## 2017-04-15 DIAGNOSIS — Z87891 Personal history of nicotine dependence: Secondary | ICD-10-CM | POA: Diagnosis not present

## 2017-04-15 DIAGNOSIS — I129 Hypertensive chronic kidney disease with stage 1 through stage 4 chronic kidney disease, or unspecified chronic kidney disease: Secondary | ICD-10-CM | POA: Diagnosis not present

## 2017-04-15 DIAGNOSIS — Z9981 Dependence on supplemental oxygen: Secondary | ICD-10-CM | POA: Diagnosis not present

## 2017-04-15 DIAGNOSIS — Z8673 Personal history of transient ischemic attack (TIA), and cerebral infarction without residual deficits: Secondary | ICD-10-CM | POA: Diagnosis not present

## 2017-04-15 DIAGNOSIS — J441 Chronic obstructive pulmonary disease with (acute) exacerbation: Secondary | ICD-10-CM | POA: Diagnosis not present

## 2017-04-15 DIAGNOSIS — N183 Chronic kidney disease, stage 3 (moderate): Secondary | ICD-10-CM | POA: Diagnosis not present

## 2017-04-15 DIAGNOSIS — E1140 Type 2 diabetes mellitus with diabetic neuropathy, unspecified: Secondary | ICD-10-CM | POA: Diagnosis not present

## 2017-04-15 NOTE — Telephone Encounter (Signed)
Pt is requesting a walker with seat,lift chair and a seat that goes on toilet that lifts.She states she spoke with you about this at last office visit

## 2017-04-15 NOTE — Telephone Encounter (Signed)
RX for this was given to patient on her last visit per brittany. Spoke with patient and advised her to take that RX to the place that will be providing her with these equipments. Patient understood-Anastasiya Estell Harpin, RMA

## 2017-04-16 ENCOUNTER — Telehealth: Payer: Self-pay | Admitting: Family Medicine

## 2017-04-16 DIAGNOSIS — E1122 Type 2 diabetes mellitus with diabetic chronic kidney disease: Secondary | ICD-10-CM | POA: Diagnosis not present

## 2017-04-16 DIAGNOSIS — E1140 Type 2 diabetes mellitus with diabetic neuropathy, unspecified: Secondary | ICD-10-CM | POA: Diagnosis not present

## 2017-04-16 DIAGNOSIS — Z7984 Long term (current) use of oral hypoglycemic drugs: Secondary | ICD-10-CM | POA: Diagnosis not present

## 2017-04-16 DIAGNOSIS — Z7952 Long term (current) use of systemic steroids: Secondary | ICD-10-CM | POA: Diagnosis not present

## 2017-04-16 DIAGNOSIS — I2510 Atherosclerotic heart disease of native coronary artery without angina pectoris: Secondary | ICD-10-CM | POA: Diagnosis not present

## 2017-04-16 DIAGNOSIS — J441 Chronic obstructive pulmonary disease with (acute) exacerbation: Secondary | ICD-10-CM | POA: Diagnosis not present

## 2017-04-16 DIAGNOSIS — Z85110 Personal history of malignant carcinoid tumor of bronchus and lung: Secondary | ICD-10-CM | POA: Diagnosis not present

## 2017-04-16 DIAGNOSIS — I129 Hypertensive chronic kidney disease with stage 1 through stage 4 chronic kidney disease, or unspecified chronic kidney disease: Secondary | ICD-10-CM | POA: Diagnosis not present

## 2017-04-16 DIAGNOSIS — Z79891 Long term (current) use of opiate analgesic: Secondary | ICD-10-CM | POA: Diagnosis not present

## 2017-04-16 DIAGNOSIS — D631 Anemia in chronic kidney disease: Secondary | ICD-10-CM | POA: Diagnosis not present

## 2017-04-16 DIAGNOSIS — Z8673 Personal history of transient ischemic attack (TIA), and cerebral infarction without residual deficits: Secondary | ICD-10-CM | POA: Diagnosis not present

## 2017-04-16 DIAGNOSIS — Z9181 History of falling: Secondary | ICD-10-CM | POA: Diagnosis not present

## 2017-04-16 DIAGNOSIS — N183 Chronic kidney disease, stage 3 (moderate): Secondary | ICD-10-CM | POA: Diagnosis not present

## 2017-04-16 DIAGNOSIS — Z87891 Personal history of nicotine dependence: Secondary | ICD-10-CM | POA: Diagnosis not present

## 2017-04-16 DIAGNOSIS — Z9981 Dependence on supplemental oxygen: Secondary | ICD-10-CM | POA: Diagnosis not present

## 2017-04-16 DIAGNOSIS — Z7951 Long term (current) use of inhaled steroids: Secondary | ICD-10-CM | POA: Diagnosis not present

## 2017-04-16 NOTE — Telephone Encounter (Signed)
Sharyn Lull with [facility] is requesting verbal orders for in home occupational therapy as follows:  Once a week for 1 week   Twice for 3 weeks  Once a week for 1 week  Please advise. Thanks TNP

## 2017-04-16 NOTE — Telephone Encounter (Signed)
Please advise-Aisha Greenberger V Nakkia Mackiewicz, RMA  

## 2017-04-19 ENCOUNTER — Telehealth: Payer: Self-pay | Admitting: Family Medicine

## 2017-04-19 NOTE — Telephone Encounter (Signed)
Ok to do?-Kimber Fritts V Harvin Konicek, RMA  

## 2017-04-19 NOTE — Telephone Encounter (Signed)
ok 

## 2017-04-19 NOTE — Telephone Encounter (Signed)
Advised  ED 

## 2017-04-19 NOTE — Telephone Encounter (Signed)
Done

## 2017-04-19 NOTE — Telephone Encounter (Signed)
Geni Bers with [facility] (Social Worker) called stated that she was supposed to see pt in home for eval last week but was unable to reach pt. Geni Bers stated she has reached pt and is scheduled for home visit tomorrow. Geni Bers is requesting a call back today to get verbal orders that she can see pt tomorrow 04/20/17. Please advise. Thanks TNP

## 2017-04-20 DIAGNOSIS — N183 Chronic kidney disease, stage 3 (moderate): Secondary | ICD-10-CM | POA: Diagnosis not present

## 2017-04-20 DIAGNOSIS — Z87891 Personal history of nicotine dependence: Secondary | ICD-10-CM | POA: Diagnosis not present

## 2017-04-20 DIAGNOSIS — Z7952 Long term (current) use of systemic steroids: Secondary | ICD-10-CM | POA: Diagnosis not present

## 2017-04-20 DIAGNOSIS — J441 Chronic obstructive pulmonary disease with (acute) exacerbation: Secondary | ICD-10-CM | POA: Diagnosis not present

## 2017-04-20 DIAGNOSIS — I129 Hypertensive chronic kidney disease with stage 1 through stage 4 chronic kidney disease, or unspecified chronic kidney disease: Secondary | ICD-10-CM | POA: Diagnosis not present

## 2017-04-20 DIAGNOSIS — I2510 Atherosclerotic heart disease of native coronary artery without angina pectoris: Secondary | ICD-10-CM | POA: Diagnosis not present

## 2017-04-20 DIAGNOSIS — Z7951 Long term (current) use of inhaled steroids: Secondary | ICD-10-CM | POA: Diagnosis not present

## 2017-04-20 DIAGNOSIS — Z8673 Personal history of transient ischemic attack (TIA), and cerebral infarction without residual deficits: Secondary | ICD-10-CM | POA: Diagnosis not present

## 2017-04-20 DIAGNOSIS — Z85110 Personal history of malignant carcinoid tumor of bronchus and lung: Secondary | ICD-10-CM | POA: Diagnosis not present

## 2017-04-20 DIAGNOSIS — D631 Anemia in chronic kidney disease: Secondary | ICD-10-CM | POA: Diagnosis not present

## 2017-04-20 DIAGNOSIS — Z9181 History of falling: Secondary | ICD-10-CM | POA: Diagnosis not present

## 2017-04-20 DIAGNOSIS — Z79891 Long term (current) use of opiate analgesic: Secondary | ICD-10-CM | POA: Diagnosis not present

## 2017-04-20 DIAGNOSIS — Z7984 Long term (current) use of oral hypoglycemic drugs: Secondary | ICD-10-CM | POA: Diagnosis not present

## 2017-04-20 DIAGNOSIS — E1122 Type 2 diabetes mellitus with diabetic chronic kidney disease: Secondary | ICD-10-CM | POA: Diagnosis not present

## 2017-04-20 DIAGNOSIS — E1140 Type 2 diabetes mellitus with diabetic neuropathy, unspecified: Secondary | ICD-10-CM | POA: Diagnosis not present

## 2017-04-20 DIAGNOSIS — Z9981 Dependence on supplemental oxygen: Secondary | ICD-10-CM | POA: Diagnosis not present

## 2017-04-22 DIAGNOSIS — N183 Chronic kidney disease, stage 3 (moderate): Secondary | ICD-10-CM | POA: Diagnosis not present

## 2017-04-22 DIAGNOSIS — E1122 Type 2 diabetes mellitus with diabetic chronic kidney disease: Secondary | ICD-10-CM | POA: Diagnosis not present

## 2017-04-22 DIAGNOSIS — I129 Hypertensive chronic kidney disease with stage 1 through stage 4 chronic kidney disease, or unspecified chronic kidney disease: Secondary | ICD-10-CM | POA: Diagnosis not present

## 2017-04-22 DIAGNOSIS — Z9181 History of falling: Secondary | ICD-10-CM | POA: Diagnosis not present

## 2017-04-22 DIAGNOSIS — Z7951 Long term (current) use of inhaled steroids: Secondary | ICD-10-CM | POA: Diagnosis not present

## 2017-04-22 DIAGNOSIS — I2510 Atherosclerotic heart disease of native coronary artery without angina pectoris: Secondary | ICD-10-CM | POA: Diagnosis not present

## 2017-04-22 DIAGNOSIS — Z85110 Personal history of malignant carcinoid tumor of bronchus and lung: Secondary | ICD-10-CM | POA: Diagnosis not present

## 2017-04-22 DIAGNOSIS — D631 Anemia in chronic kidney disease: Secondary | ICD-10-CM | POA: Diagnosis not present

## 2017-04-22 DIAGNOSIS — Z79891 Long term (current) use of opiate analgesic: Secondary | ICD-10-CM | POA: Diagnosis not present

## 2017-04-22 DIAGNOSIS — Z7952 Long term (current) use of systemic steroids: Secondary | ICD-10-CM | POA: Diagnosis not present

## 2017-04-22 DIAGNOSIS — Z7984 Long term (current) use of oral hypoglycemic drugs: Secondary | ICD-10-CM | POA: Diagnosis not present

## 2017-04-22 DIAGNOSIS — Z9981 Dependence on supplemental oxygen: Secondary | ICD-10-CM | POA: Diagnosis not present

## 2017-04-22 DIAGNOSIS — E1140 Type 2 diabetes mellitus with diabetic neuropathy, unspecified: Secondary | ICD-10-CM | POA: Diagnosis not present

## 2017-04-22 DIAGNOSIS — Z87891 Personal history of nicotine dependence: Secondary | ICD-10-CM | POA: Diagnosis not present

## 2017-04-22 DIAGNOSIS — J441 Chronic obstructive pulmonary disease with (acute) exacerbation: Secondary | ICD-10-CM | POA: Diagnosis not present

## 2017-04-22 DIAGNOSIS — Z8673 Personal history of transient ischemic attack (TIA), and cerebral infarction without residual deficits: Secondary | ICD-10-CM | POA: Diagnosis not present

## 2017-04-23 DIAGNOSIS — I129 Hypertensive chronic kidney disease with stage 1 through stage 4 chronic kidney disease, or unspecified chronic kidney disease: Secondary | ICD-10-CM | POA: Diagnosis not present

## 2017-04-23 DIAGNOSIS — Z9181 History of falling: Secondary | ICD-10-CM | POA: Diagnosis not present

## 2017-04-23 DIAGNOSIS — D631 Anemia in chronic kidney disease: Secondary | ICD-10-CM | POA: Diagnosis not present

## 2017-04-23 DIAGNOSIS — Z8673 Personal history of transient ischemic attack (TIA), and cerebral infarction without residual deficits: Secondary | ICD-10-CM | POA: Diagnosis not present

## 2017-04-23 DIAGNOSIS — Z7984 Long term (current) use of oral hypoglycemic drugs: Secondary | ICD-10-CM | POA: Diagnosis not present

## 2017-04-23 DIAGNOSIS — Z7951 Long term (current) use of inhaled steroids: Secondary | ICD-10-CM | POA: Diagnosis not present

## 2017-04-23 DIAGNOSIS — E1140 Type 2 diabetes mellitus with diabetic neuropathy, unspecified: Secondary | ICD-10-CM | POA: Diagnosis not present

## 2017-04-23 DIAGNOSIS — J441 Chronic obstructive pulmonary disease with (acute) exacerbation: Secondary | ICD-10-CM | POA: Diagnosis not present

## 2017-04-23 DIAGNOSIS — Z85110 Personal history of malignant carcinoid tumor of bronchus and lung: Secondary | ICD-10-CM | POA: Diagnosis not present

## 2017-04-23 DIAGNOSIS — Z7952 Long term (current) use of systemic steroids: Secondary | ICD-10-CM | POA: Diagnosis not present

## 2017-04-23 DIAGNOSIS — Z87891 Personal history of nicotine dependence: Secondary | ICD-10-CM | POA: Diagnosis not present

## 2017-04-23 DIAGNOSIS — Z9981 Dependence on supplemental oxygen: Secondary | ICD-10-CM | POA: Diagnosis not present

## 2017-04-23 DIAGNOSIS — E1122 Type 2 diabetes mellitus with diabetic chronic kidney disease: Secondary | ICD-10-CM | POA: Diagnosis not present

## 2017-04-23 DIAGNOSIS — Z79891 Long term (current) use of opiate analgesic: Secondary | ICD-10-CM | POA: Diagnosis not present

## 2017-04-23 DIAGNOSIS — N183 Chronic kidney disease, stage 3 (moderate): Secondary | ICD-10-CM | POA: Diagnosis not present

## 2017-04-23 DIAGNOSIS — I2510 Atherosclerotic heart disease of native coronary artery without angina pectoris: Secondary | ICD-10-CM | POA: Diagnosis not present

## 2017-04-25 DIAGNOSIS — R0602 Shortness of breath: Secondary | ICD-10-CM | POA: Diagnosis not present

## 2017-04-25 DIAGNOSIS — J449 Chronic obstructive pulmonary disease, unspecified: Secondary | ICD-10-CM | POA: Diagnosis not present

## 2017-04-27 DIAGNOSIS — I129 Hypertensive chronic kidney disease with stage 1 through stage 4 chronic kidney disease, or unspecified chronic kidney disease: Secondary | ICD-10-CM | POA: Diagnosis not present

## 2017-04-27 DIAGNOSIS — N183 Chronic kidney disease, stage 3 (moderate): Secondary | ICD-10-CM | POA: Diagnosis not present

## 2017-04-27 DIAGNOSIS — E1122 Type 2 diabetes mellitus with diabetic chronic kidney disease: Secondary | ICD-10-CM | POA: Diagnosis not present

## 2017-04-27 DIAGNOSIS — Z7951 Long term (current) use of inhaled steroids: Secondary | ICD-10-CM | POA: Diagnosis not present

## 2017-04-27 DIAGNOSIS — D631 Anemia in chronic kidney disease: Secondary | ICD-10-CM | POA: Diagnosis not present

## 2017-04-27 DIAGNOSIS — Z7984 Long term (current) use of oral hypoglycemic drugs: Secondary | ICD-10-CM | POA: Diagnosis not present

## 2017-04-27 DIAGNOSIS — E1140 Type 2 diabetes mellitus with diabetic neuropathy, unspecified: Secondary | ICD-10-CM | POA: Diagnosis not present

## 2017-04-27 DIAGNOSIS — Z7952 Long term (current) use of systemic steroids: Secondary | ICD-10-CM | POA: Diagnosis not present

## 2017-04-27 DIAGNOSIS — Z79891 Long term (current) use of opiate analgesic: Secondary | ICD-10-CM | POA: Diagnosis not present

## 2017-04-27 DIAGNOSIS — J441 Chronic obstructive pulmonary disease with (acute) exacerbation: Secondary | ICD-10-CM | POA: Diagnosis not present

## 2017-04-27 DIAGNOSIS — Z9181 History of falling: Secondary | ICD-10-CM | POA: Diagnosis not present

## 2017-04-27 DIAGNOSIS — Z9981 Dependence on supplemental oxygen: Secondary | ICD-10-CM | POA: Diagnosis not present

## 2017-04-27 DIAGNOSIS — Z85110 Personal history of malignant carcinoid tumor of bronchus and lung: Secondary | ICD-10-CM | POA: Diagnosis not present

## 2017-04-27 DIAGNOSIS — J449 Chronic obstructive pulmonary disease, unspecified: Secondary | ICD-10-CM | POA: Diagnosis not present

## 2017-04-27 DIAGNOSIS — Z87891 Personal history of nicotine dependence: Secondary | ICD-10-CM | POA: Diagnosis not present

## 2017-04-27 DIAGNOSIS — Z8673 Personal history of transient ischemic attack (TIA), and cerebral infarction without residual deficits: Secondary | ICD-10-CM | POA: Diagnosis not present

## 2017-04-27 DIAGNOSIS — I2510 Atherosclerotic heart disease of native coronary artery without angina pectoris: Secondary | ICD-10-CM | POA: Diagnosis not present

## 2017-04-28 DIAGNOSIS — Z85110 Personal history of malignant carcinoid tumor of bronchus and lung: Secondary | ICD-10-CM | POA: Diagnosis not present

## 2017-04-28 DIAGNOSIS — E1122 Type 2 diabetes mellitus with diabetic chronic kidney disease: Secondary | ICD-10-CM | POA: Diagnosis not present

## 2017-04-28 DIAGNOSIS — Z7984 Long term (current) use of oral hypoglycemic drugs: Secondary | ICD-10-CM | POA: Diagnosis not present

## 2017-04-28 DIAGNOSIS — Z7951 Long term (current) use of inhaled steroids: Secondary | ICD-10-CM | POA: Diagnosis not present

## 2017-04-28 DIAGNOSIS — N183 Chronic kidney disease, stage 3 (moderate): Secondary | ICD-10-CM | POA: Diagnosis not present

## 2017-04-28 DIAGNOSIS — Z79891 Long term (current) use of opiate analgesic: Secondary | ICD-10-CM | POA: Diagnosis not present

## 2017-04-28 DIAGNOSIS — Z87891 Personal history of nicotine dependence: Secondary | ICD-10-CM | POA: Diagnosis not present

## 2017-04-28 DIAGNOSIS — I2510 Atherosclerotic heart disease of native coronary artery without angina pectoris: Secondary | ICD-10-CM | POA: Diagnosis not present

## 2017-04-28 DIAGNOSIS — Z9181 History of falling: Secondary | ICD-10-CM | POA: Diagnosis not present

## 2017-04-28 DIAGNOSIS — Z7952 Long term (current) use of systemic steroids: Secondary | ICD-10-CM | POA: Diagnosis not present

## 2017-04-28 DIAGNOSIS — Z9981 Dependence on supplemental oxygen: Secondary | ICD-10-CM | POA: Diagnosis not present

## 2017-04-28 DIAGNOSIS — D631 Anemia in chronic kidney disease: Secondary | ICD-10-CM | POA: Diagnosis not present

## 2017-04-28 DIAGNOSIS — I129 Hypertensive chronic kidney disease with stage 1 through stage 4 chronic kidney disease, or unspecified chronic kidney disease: Secondary | ICD-10-CM | POA: Diagnosis not present

## 2017-04-28 DIAGNOSIS — E1140 Type 2 diabetes mellitus with diabetic neuropathy, unspecified: Secondary | ICD-10-CM | POA: Diagnosis not present

## 2017-04-28 DIAGNOSIS — J441 Chronic obstructive pulmonary disease with (acute) exacerbation: Secondary | ICD-10-CM | POA: Diagnosis not present

## 2017-04-28 DIAGNOSIS — Z8673 Personal history of transient ischemic attack (TIA), and cerebral infarction without residual deficits: Secondary | ICD-10-CM | POA: Diagnosis not present

## 2017-04-29 ENCOUNTER — Other Ambulatory Visit: Payer: Self-pay | Admitting: *Deleted

## 2017-04-29 DIAGNOSIS — Z7952 Long term (current) use of systemic steroids: Secondary | ICD-10-CM | POA: Diagnosis not present

## 2017-04-29 DIAGNOSIS — Z87891 Personal history of nicotine dependence: Secondary | ICD-10-CM | POA: Diagnosis not present

## 2017-04-29 DIAGNOSIS — E1140 Type 2 diabetes mellitus with diabetic neuropathy, unspecified: Secondary | ICD-10-CM | POA: Diagnosis not present

## 2017-04-29 DIAGNOSIS — Z9981 Dependence on supplemental oxygen: Secondary | ICD-10-CM | POA: Diagnosis not present

## 2017-04-29 DIAGNOSIS — I2510 Atherosclerotic heart disease of native coronary artery without angina pectoris: Secondary | ICD-10-CM | POA: Diagnosis not present

## 2017-04-29 DIAGNOSIS — Z7984 Long term (current) use of oral hypoglycemic drugs: Secondary | ICD-10-CM | POA: Diagnosis not present

## 2017-04-29 DIAGNOSIS — I129 Hypertensive chronic kidney disease with stage 1 through stage 4 chronic kidney disease, or unspecified chronic kidney disease: Secondary | ICD-10-CM | POA: Diagnosis not present

## 2017-04-29 DIAGNOSIS — J441 Chronic obstructive pulmonary disease with (acute) exacerbation: Secondary | ICD-10-CM | POA: Diagnosis not present

## 2017-04-29 DIAGNOSIS — Z85110 Personal history of malignant carcinoid tumor of bronchus and lung: Secondary | ICD-10-CM | POA: Diagnosis not present

## 2017-04-29 DIAGNOSIS — Z9181 History of falling: Secondary | ICD-10-CM | POA: Diagnosis not present

## 2017-04-29 DIAGNOSIS — Z8673 Personal history of transient ischemic attack (TIA), and cerebral infarction without residual deficits: Secondary | ICD-10-CM | POA: Diagnosis not present

## 2017-04-29 DIAGNOSIS — Z79891 Long term (current) use of opiate analgesic: Secondary | ICD-10-CM | POA: Diagnosis not present

## 2017-04-29 DIAGNOSIS — E1122 Type 2 diabetes mellitus with diabetic chronic kidney disease: Secondary | ICD-10-CM | POA: Diagnosis not present

## 2017-04-29 DIAGNOSIS — I6523 Occlusion and stenosis of bilateral carotid arteries: Secondary | ICD-10-CM

## 2017-04-29 DIAGNOSIS — Z7951 Long term (current) use of inhaled steroids: Secondary | ICD-10-CM | POA: Diagnosis not present

## 2017-04-29 DIAGNOSIS — D631 Anemia in chronic kidney disease: Secondary | ICD-10-CM | POA: Diagnosis not present

## 2017-04-29 DIAGNOSIS — N183 Chronic kidney disease, stage 3 (moderate): Secondary | ICD-10-CM | POA: Diagnosis not present

## 2017-04-30 DIAGNOSIS — J441 Chronic obstructive pulmonary disease with (acute) exacerbation: Secondary | ICD-10-CM | POA: Diagnosis not present

## 2017-04-30 DIAGNOSIS — Z85110 Personal history of malignant carcinoid tumor of bronchus and lung: Secondary | ICD-10-CM | POA: Diagnosis not present

## 2017-04-30 DIAGNOSIS — D631 Anemia in chronic kidney disease: Secondary | ICD-10-CM | POA: Diagnosis not present

## 2017-04-30 DIAGNOSIS — I2510 Atherosclerotic heart disease of native coronary artery without angina pectoris: Secondary | ICD-10-CM | POA: Diagnosis not present

## 2017-04-30 DIAGNOSIS — Z7951 Long term (current) use of inhaled steroids: Secondary | ICD-10-CM | POA: Diagnosis not present

## 2017-04-30 DIAGNOSIS — I129 Hypertensive chronic kidney disease with stage 1 through stage 4 chronic kidney disease, or unspecified chronic kidney disease: Secondary | ICD-10-CM | POA: Diagnosis not present

## 2017-04-30 DIAGNOSIS — Z9981 Dependence on supplemental oxygen: Secondary | ICD-10-CM | POA: Diagnosis not present

## 2017-04-30 DIAGNOSIS — E1140 Type 2 diabetes mellitus with diabetic neuropathy, unspecified: Secondary | ICD-10-CM | POA: Diagnosis not present

## 2017-04-30 DIAGNOSIS — Z7984 Long term (current) use of oral hypoglycemic drugs: Secondary | ICD-10-CM | POA: Diagnosis not present

## 2017-04-30 DIAGNOSIS — Z9181 History of falling: Secondary | ICD-10-CM | POA: Diagnosis not present

## 2017-04-30 DIAGNOSIS — Z79891 Long term (current) use of opiate analgesic: Secondary | ICD-10-CM | POA: Diagnosis not present

## 2017-04-30 DIAGNOSIS — Z8673 Personal history of transient ischemic attack (TIA), and cerebral infarction without residual deficits: Secondary | ICD-10-CM | POA: Diagnosis not present

## 2017-04-30 DIAGNOSIS — Z87891 Personal history of nicotine dependence: Secondary | ICD-10-CM | POA: Diagnosis not present

## 2017-04-30 DIAGNOSIS — Z7952 Long term (current) use of systemic steroids: Secondary | ICD-10-CM | POA: Diagnosis not present

## 2017-04-30 DIAGNOSIS — N183 Chronic kidney disease, stage 3 (moderate): Secondary | ICD-10-CM | POA: Diagnosis not present

## 2017-04-30 DIAGNOSIS — E1122 Type 2 diabetes mellitus with diabetic chronic kidney disease: Secondary | ICD-10-CM | POA: Diagnosis not present

## 2017-05-03 DIAGNOSIS — Z7952 Long term (current) use of systemic steroids: Secondary | ICD-10-CM | POA: Diagnosis not present

## 2017-05-03 DIAGNOSIS — Z9981 Dependence on supplemental oxygen: Secondary | ICD-10-CM | POA: Diagnosis not present

## 2017-05-03 DIAGNOSIS — I129 Hypertensive chronic kidney disease with stage 1 through stage 4 chronic kidney disease, or unspecified chronic kidney disease: Secondary | ICD-10-CM | POA: Diagnosis not present

## 2017-05-03 DIAGNOSIS — I2510 Atherosclerotic heart disease of native coronary artery without angina pectoris: Secondary | ICD-10-CM | POA: Diagnosis not present

## 2017-05-03 DIAGNOSIS — E1140 Type 2 diabetes mellitus with diabetic neuropathy, unspecified: Secondary | ICD-10-CM | POA: Diagnosis not present

## 2017-05-03 DIAGNOSIS — Z87891 Personal history of nicotine dependence: Secondary | ICD-10-CM | POA: Diagnosis not present

## 2017-05-03 DIAGNOSIS — N183 Chronic kidney disease, stage 3 (moderate): Secondary | ICD-10-CM | POA: Diagnosis not present

## 2017-05-03 DIAGNOSIS — Z8673 Personal history of transient ischemic attack (TIA), and cerebral infarction without residual deficits: Secondary | ICD-10-CM | POA: Diagnosis not present

## 2017-05-03 DIAGNOSIS — Z79891 Long term (current) use of opiate analgesic: Secondary | ICD-10-CM | POA: Diagnosis not present

## 2017-05-03 DIAGNOSIS — Z7984 Long term (current) use of oral hypoglycemic drugs: Secondary | ICD-10-CM | POA: Diagnosis not present

## 2017-05-03 DIAGNOSIS — E1122 Type 2 diabetes mellitus with diabetic chronic kidney disease: Secondary | ICD-10-CM | POA: Diagnosis not present

## 2017-05-03 DIAGNOSIS — D631 Anemia in chronic kidney disease: Secondary | ICD-10-CM | POA: Diagnosis not present

## 2017-05-03 DIAGNOSIS — Z7951 Long term (current) use of inhaled steroids: Secondary | ICD-10-CM | POA: Diagnosis not present

## 2017-05-03 DIAGNOSIS — Z9181 History of falling: Secondary | ICD-10-CM | POA: Diagnosis not present

## 2017-05-03 DIAGNOSIS — Z85110 Personal history of malignant carcinoid tumor of bronchus and lung: Secondary | ICD-10-CM | POA: Diagnosis not present

## 2017-05-03 DIAGNOSIS — J441 Chronic obstructive pulmonary disease with (acute) exacerbation: Secondary | ICD-10-CM | POA: Diagnosis not present

## 2017-05-04 DIAGNOSIS — Z9181 History of falling: Secondary | ICD-10-CM | POA: Diagnosis not present

## 2017-05-04 DIAGNOSIS — Z87891 Personal history of nicotine dependence: Secondary | ICD-10-CM | POA: Diagnosis not present

## 2017-05-04 DIAGNOSIS — Z8673 Personal history of transient ischemic attack (TIA), and cerebral infarction without residual deficits: Secondary | ICD-10-CM | POA: Diagnosis not present

## 2017-05-04 DIAGNOSIS — Z7951 Long term (current) use of inhaled steroids: Secondary | ICD-10-CM | POA: Diagnosis not present

## 2017-05-04 DIAGNOSIS — Z9981 Dependence on supplemental oxygen: Secondary | ICD-10-CM | POA: Diagnosis not present

## 2017-05-04 DIAGNOSIS — D631 Anemia in chronic kidney disease: Secondary | ICD-10-CM | POA: Diagnosis not present

## 2017-05-04 DIAGNOSIS — E1140 Type 2 diabetes mellitus with diabetic neuropathy, unspecified: Secondary | ICD-10-CM | POA: Diagnosis not present

## 2017-05-04 DIAGNOSIS — I2510 Atherosclerotic heart disease of native coronary artery without angina pectoris: Secondary | ICD-10-CM | POA: Diagnosis not present

## 2017-05-04 DIAGNOSIS — J441 Chronic obstructive pulmonary disease with (acute) exacerbation: Secondary | ICD-10-CM | POA: Diagnosis not present

## 2017-05-04 DIAGNOSIS — N183 Chronic kidney disease, stage 3 (moderate): Secondary | ICD-10-CM | POA: Diagnosis not present

## 2017-05-04 DIAGNOSIS — I129 Hypertensive chronic kidney disease with stage 1 through stage 4 chronic kidney disease, or unspecified chronic kidney disease: Secondary | ICD-10-CM | POA: Diagnosis not present

## 2017-05-04 DIAGNOSIS — Z7984 Long term (current) use of oral hypoglycemic drugs: Secondary | ICD-10-CM | POA: Diagnosis not present

## 2017-05-04 DIAGNOSIS — Z7952 Long term (current) use of systemic steroids: Secondary | ICD-10-CM | POA: Diagnosis not present

## 2017-05-04 DIAGNOSIS — Z85110 Personal history of malignant carcinoid tumor of bronchus and lung: Secondary | ICD-10-CM | POA: Diagnosis not present

## 2017-05-04 DIAGNOSIS — Z79891 Long term (current) use of opiate analgesic: Secondary | ICD-10-CM | POA: Diagnosis not present

## 2017-05-04 DIAGNOSIS — E1122 Type 2 diabetes mellitus with diabetic chronic kidney disease: Secondary | ICD-10-CM | POA: Diagnosis not present

## 2017-05-05 DIAGNOSIS — I129 Hypertensive chronic kidney disease with stage 1 through stage 4 chronic kidney disease, or unspecified chronic kidney disease: Secondary | ICD-10-CM | POA: Diagnosis not present

## 2017-05-05 DIAGNOSIS — Z79891 Long term (current) use of opiate analgesic: Secondary | ICD-10-CM | POA: Diagnosis not present

## 2017-05-05 DIAGNOSIS — Z7951 Long term (current) use of inhaled steroids: Secondary | ICD-10-CM | POA: Diagnosis not present

## 2017-05-05 DIAGNOSIS — D631 Anemia in chronic kidney disease: Secondary | ICD-10-CM | POA: Diagnosis not present

## 2017-05-05 DIAGNOSIS — N183 Chronic kidney disease, stage 3 (moderate): Secondary | ICD-10-CM | POA: Diagnosis not present

## 2017-05-05 DIAGNOSIS — J441 Chronic obstructive pulmonary disease with (acute) exacerbation: Secondary | ICD-10-CM | POA: Diagnosis not present

## 2017-05-05 DIAGNOSIS — Z7952 Long term (current) use of systemic steroids: Secondary | ICD-10-CM | POA: Diagnosis not present

## 2017-05-05 DIAGNOSIS — I2510 Atherosclerotic heart disease of native coronary artery without angina pectoris: Secondary | ICD-10-CM | POA: Diagnosis not present

## 2017-05-05 DIAGNOSIS — Z9981 Dependence on supplemental oxygen: Secondary | ICD-10-CM | POA: Diagnosis not present

## 2017-05-05 DIAGNOSIS — Z85110 Personal history of malignant carcinoid tumor of bronchus and lung: Secondary | ICD-10-CM | POA: Diagnosis not present

## 2017-05-05 DIAGNOSIS — Z87891 Personal history of nicotine dependence: Secondary | ICD-10-CM | POA: Diagnosis not present

## 2017-05-05 DIAGNOSIS — E1122 Type 2 diabetes mellitus with diabetic chronic kidney disease: Secondary | ICD-10-CM | POA: Diagnosis not present

## 2017-05-05 DIAGNOSIS — Z8673 Personal history of transient ischemic attack (TIA), and cerebral infarction without residual deficits: Secondary | ICD-10-CM | POA: Diagnosis not present

## 2017-05-05 DIAGNOSIS — Z7984 Long term (current) use of oral hypoglycemic drugs: Secondary | ICD-10-CM | POA: Diagnosis not present

## 2017-05-05 DIAGNOSIS — E1140 Type 2 diabetes mellitus with diabetic neuropathy, unspecified: Secondary | ICD-10-CM | POA: Diagnosis not present

## 2017-05-05 DIAGNOSIS — Z9181 History of falling: Secondary | ICD-10-CM | POA: Diagnosis not present

## 2017-05-06 DIAGNOSIS — Z7984 Long term (current) use of oral hypoglycemic drugs: Secondary | ICD-10-CM | POA: Diagnosis not present

## 2017-05-06 DIAGNOSIS — I2510 Atherosclerotic heart disease of native coronary artery without angina pectoris: Secondary | ICD-10-CM | POA: Diagnosis not present

## 2017-05-06 DIAGNOSIS — Z8673 Personal history of transient ischemic attack (TIA), and cerebral infarction without residual deficits: Secondary | ICD-10-CM | POA: Diagnosis not present

## 2017-05-06 DIAGNOSIS — Z87891 Personal history of nicotine dependence: Secondary | ICD-10-CM | POA: Diagnosis not present

## 2017-05-06 DIAGNOSIS — Z85110 Personal history of malignant carcinoid tumor of bronchus and lung: Secondary | ICD-10-CM | POA: Diagnosis not present

## 2017-05-06 DIAGNOSIS — Z7951 Long term (current) use of inhaled steroids: Secondary | ICD-10-CM | POA: Diagnosis not present

## 2017-05-06 DIAGNOSIS — Z9181 History of falling: Secondary | ICD-10-CM | POA: Diagnosis not present

## 2017-05-06 DIAGNOSIS — E1122 Type 2 diabetes mellitus with diabetic chronic kidney disease: Secondary | ICD-10-CM | POA: Diagnosis not present

## 2017-05-06 DIAGNOSIS — Z79891 Long term (current) use of opiate analgesic: Secondary | ICD-10-CM | POA: Diagnosis not present

## 2017-05-06 DIAGNOSIS — Z9981 Dependence on supplemental oxygen: Secondary | ICD-10-CM | POA: Diagnosis not present

## 2017-05-06 DIAGNOSIS — N183 Chronic kidney disease, stage 3 (moderate): Secondary | ICD-10-CM | POA: Diagnosis not present

## 2017-05-06 DIAGNOSIS — J441 Chronic obstructive pulmonary disease with (acute) exacerbation: Secondary | ICD-10-CM | POA: Diagnosis not present

## 2017-05-06 DIAGNOSIS — Z7952 Long term (current) use of systemic steroids: Secondary | ICD-10-CM | POA: Diagnosis not present

## 2017-05-06 DIAGNOSIS — E1140 Type 2 diabetes mellitus with diabetic neuropathy, unspecified: Secondary | ICD-10-CM | POA: Diagnosis not present

## 2017-05-06 DIAGNOSIS — I129 Hypertensive chronic kidney disease with stage 1 through stage 4 chronic kidney disease, or unspecified chronic kidney disease: Secondary | ICD-10-CM | POA: Diagnosis not present

## 2017-05-06 DIAGNOSIS — D631 Anemia in chronic kidney disease: Secondary | ICD-10-CM | POA: Diagnosis not present

## 2017-05-07 ENCOUNTER — Ambulatory Visit (INDEPENDENT_AMBULATORY_CARE_PROVIDER_SITE_OTHER): Payer: Medicare Other | Admitting: Vascular Surgery

## 2017-05-07 ENCOUNTER — Encounter (INDEPENDENT_AMBULATORY_CARE_PROVIDER_SITE_OTHER): Payer: Self-pay

## 2017-05-07 ENCOUNTER — Encounter (INDEPENDENT_AMBULATORY_CARE_PROVIDER_SITE_OTHER): Payer: Self-pay | Admitting: Vascular Surgery

## 2017-05-07 VITALS — BP 137/64 | HR 92 | Resp 15 | Ht 64.0 in | Wt 148.0 lb

## 2017-05-07 DIAGNOSIS — I6523 Occlusion and stenosis of bilateral carotid arteries: Secondary | ICD-10-CM | POA: Diagnosis not present

## 2017-05-07 DIAGNOSIS — I10 Essential (primary) hypertension: Secondary | ICD-10-CM | POA: Diagnosis not present

## 2017-05-07 DIAGNOSIS — E1342 Other specified diabetes mellitus with diabetic polyneuropathy: Secondary | ICD-10-CM | POA: Diagnosis not present

## 2017-05-07 DIAGNOSIS — E782 Mixed hyperlipidemia: Secondary | ICD-10-CM | POA: Diagnosis not present

## 2017-05-07 DIAGNOSIS — C3411 Malignant neoplasm of upper lobe, right bronchus or lung: Secondary | ICD-10-CM | POA: Diagnosis not present

## 2017-05-07 NOTE — Progress Notes (Signed)
Patient ID: [name] [name], female   DOB: January 06, 1941, 77 y.o.   MRN: [id number]  Chief Complaint  Patient presents with  . New Patient (Initial Visit)    ref Rosanna Randy for port removal    HPI [name] [name] is a 77 y.o. female.  I am asked to see the patient by Dr. Rosanna Randy  for evaluation of port removal.  The patient reports no longer needing this after completing therapy for her lung cancer.  This was placed about a year and a half ago.  It is mildly bothersome to her and she has been anxiously awaiting when she could get this removed. Also, the patient has a previous history of carotid artery stenosis and is status post a left carotid endarterectomy 2-1/2 years ago.  We have not checked her carotid arteries and at least 1-2 years as best I can tell and I do not see that they have been followed anywhere she has had multiple other ongoing issues but no focal neurologic issues reported. Specifically, the patient denies amaurosis fugax, speech or swallowing difficulties, or arm or leg weakness or numbness    Past Medical History:  Diagnosis Date  . Abnormal CT lung screening 10/17/2015  . COPD (chronic obstructive pulmonary disease) ([place])   . Coronary artery disease, non-occlusive    a. cath 2006: min nonobs CAD; b. cath 12/2010: cath LAD 50%, RCA 60%; c. 08/2013: Minimal luminal irregs, right dominant system with no significant CAD, diffuse luminal irregs noted. Normal EF 55%, no AS or MS.   . Diabetes mellitus   . Hyperlipemia    Followed by Dr. Rosanna Randy  . Hypertension   . Lung cancer ([place])   . Macular degeneration    rt  . Personal history of tobacco use, presenting hazards to health 10/15/2015  . Pneumonia    hx  . Shortness of breath   . Stroke [facility])     Past Surgical History:  Procedure Laterality Date  . ABDOMINAL HYSTERECTOMY    . ANTERIOR CERVICAL DECOMP/DISCECTOMY FUSION N/A 10/19/2013   Procedure: CERVICAL FIVE-SIX ANTERIOR CERVICAL DECOMPRESSION WITH FUSION INTERBODY  PROSTHESIS PLATING AND PEEK CAGE;  Surgeon: Ophelia Charter, MD;  Location: [place] NEURO ORS;  Service: Neurosurgery;  Laterality: N/A;  . BACK SURGERY  80's  . BREAST CYST EXCISION Left    left negative   . CARDIAC CATHETERIZATION  05/2004  . CATARACT EXTRACTION Left   . CHOLECYSTECTOMY    . ENDARTERECTOMY Left 10/24/2014   Procedure: ENDARTERECTOMY CAROTID;  Surgeon: Algernon Huxley, MD;  Location: [facility] ORS;  Service: Vascular;  Laterality: Left;  . ENDOBRONCHIAL ULTRASOUND N/A 11/14/2015   Procedure: ENDOBRONCHIAL ULTRASOUND;  Surgeon: [name], MD;  Location: [facility] ORS;  Service: Cardiopulmonary;  Laterality: N/A;  . PERIPHERAL VASCULAR CATHETERIZATION N/A 12/04/2015   Procedure: Glori Luis Cath Insertion;  Surgeon: Algernon Huxley, MD;  Location: [employer] CV LAB;  Service: Cardiovascular;  Laterality: N/A;  . TONSILLECTOMY AND ADENOIDECTOMY    . VESICOVAGINAL FISTULA CLOSURE W/ TAH      Family History  Problem Relation Age of Onset  . Stroke Mother        Massive  . Heart attack Father        Massive  . Heart attack Brother   . Heart attack Brother   . Lung cancer Maternal Grandfather   . Heart attack Paternal Grandmother        MI    Social History Social History   Tobacco  Use  . Smoking status: Former Smoker    Packs/day: 1.00    Years: 50.00    Pack years: 50.00    Types: Cigarettes    Last attempt to quit: 10/03/2015    Years since quitting: 1.5  . Smokeless tobacco: Never Used  . Tobacco comment: smokes 3 cigs daily 05/06/15. Pt instructed to quit.  Substance Use Topics  . Alcohol use: No    Alcohol/week: 0.0 oz  . Drug use: No     Allergies  Allergen Reactions  . Coconut Fatty Acids Swelling    Throat swells    Current Outpatient Medications  Medication Sig Dispense Refill  . albuterol (PROVENTIL) (5 MG/ML) 0.5% nebulizer solution Take 0.5 mLs (2.5 mg total) by nebulization every 6 (six) hours as needed for wheezing or shortness of breath. 100 vial 0  .  atorvastatin (LIPITOR) 40 MG tablet Take 0.5 tablets (20 mg total) by mouth at bedtime. 90 tablet 3  . budesonide-formoterol (SYMBICORT) 160-4.5 MCG/ACT inhaler Inhale 2 puffs into the lungs 2 (two) times daily. 1 Inhaler 0  . diazepam (VALIUM) 5 MG tablet Take 1 tablet (5 mg total) by mouth every 12 (twelve) hours as needed for muscle spasms. 30 tablet 5  . gabapentin (NEURONTIN) 600 MG tablet Take 0.5 tablets (300 mg total) by mouth 2 (two) times daily. 60 tablet 2  . glimepiride (AMARYL) 2 MG tablet Take 1 tablet (2 mg total) by mouth 2 (two) times daily. 60 tablet 2  . HYDROcodone-acetaminophen (NORCO) 5-325 MG tablet Take 1 tablet by mouth every 6 (six) hours as needed for moderate pain or severe pain. 20 tablet 0  . isosorbide mononitrate (IMDUR) 30 MG 24 hr tablet TAKE 1 TABLET BY MOUTH  DAILY 90 tablet 0  . magnesium oxide (MAG-OX) 400 (241.3 MG) MG tablet Take 1 tablet by mouth 2 (two) times daily.    . metoprolol tartrate (LOPRESSOR) 25 MG tablet Take 1 tablet (25 mg total) by mouth 2 (two) times daily. 180 tablet 3  . mometasone (ELOCON) 0.1 % cream Apply small amount nightly to each ear 45 g 0  . Multiple Vitamins-Minerals (ICAPS) CAPS Take 2 capsules by mouth daily. Reported on 04/17/2015    . nitroGLYCERIN (NITROSTAT) 0.4 MG SL tablet Place 1 tablet (0.4 mg total) under the tongue every 5 (five) minutes as needed for chest pain. 25 tablet 3  . nystatin cream (MYCOSTATIN) Apply 1 application topically 2 (two) times daily. 30 g 0  . ondansetron (ZOFRAN) 8 MG tablet Take 1 tablet (8 mg total) by mouth every 8 (eight) hours as needed for nausea or vomiting. 30 tablet 1  . ONE TOUCH ULTRA TEST test strip USE TO CHECK BLOOD SUGAR ONCE DAILY 100 each 11  . pantoprazole (PROTONIX) 40 MG tablet TAKE 1 TABLET BY MOUTH  DAILY 90 tablet 3  . predniSONE (DELTASONE) 10 MG tablet Take 2 tabs po day1; 1 tab po day2; 1/2 tab po day3,4 5 tablet 0  . amoxicillin-clavulanate (AUGMENTIN) 500-125 MG tablet  Take 1 tablet (500 mg total) by mouth 2 (two) times daily. (Patient not taking: Reported on 05/07/2017) 16 tablet 0   No current facility-administered medications for this visit.    Facility-Administered Medications Ordered in Other Visits  Medication Dose Route Frequency Provider Last Rate Last Dose  . ondansetron (ZOFRAN) 8 mg, dexamethasone (DECADRON) 10 mg in sodium chloride 0.9 % 50 mL IVPB   Intravenous Once Lloyd Huger, MD  REVIEW OF SYSTEMS (Negative unless checked)  Constitutional: '[]' Weight loss  '[]' Fever  '[]' Chills Cardiac: '[]' Chest pain   '[]' Chest pressure   '[]' Palpitations   '[]' Shortness of breath when laying flat   '[]' Shortness of breath at rest   '[x]' Shortness of breath with exertion. Vascular:  '[]' Pain in legs with walking   '[]' Pain in legs at rest   '[]' Pain in legs when laying flat   '[]' Claudication   '[]' Pain in feet when walking  '[]' Pain in feet at rest  '[]' Pain in feet when laying flat   '[]' History of DVT   '[]' Phlebitis   '[x]' Swelling in legs   '[]' Varicose veins   '[]' Non-healing ulcers Pulmonary:   '[]' Uses home oxygen   '[]' Productive cough   '[]' Hemoptysis   '[]' Wheeze  '[x]' COPD   '[]' Asthma Neurologic:  '[x]' Dizziness  '[]' Blackouts   '[]' Seizures   '[]' History of stroke   '[]' History of TIA  '[]' Aphasia   '[]' Temporary blindness   '[]' Dysphagia   '[]' Weakness or numbness in arms   '[x]' Weakness or numbness in legs Musculoskeletal:  '[x]' Arthritis   '[]' Joint swelling   '[]' Joint pain   '[]' Low back pain Hematologic:  '[]' Easy bruising  '[]' Easy bleeding   '[]' Hypercoagulable state   '[]' Anemic  '[]' Hepatitis Gastrointestinal:  '[]' Blood in stool   '[]' Vomiting blood  '[x]' Gastroesophageal reflux/heartburn   '[]' Abdominal pain Genitourinary:  '[]' Chronic kidney disease   '[]' Difficult urination  '[]' Frequent urination  '[]' Burning with urination   '[]' Hematuria Skin:  '[]' Rashes   '[]' Ulcers   '[]' Wounds Psychological:  '[]' History of anxiety   '[]'  History of major depression.    Physical Exam BP 137/64 (BP Location: Right Arm)   Pulse 92   Resp  15   Ht '5\' 4"'  (1.626 m)   Wt 148 lb (67.1 kg)   BMI 25.40 kg/m  Gen:  WD/WN, NAD Head: [place]/AT, No temporalis wasting. Ear/Nose/Throat: Hearing grossly intact, nares w/o erythema or drainage, dentition poor Eyes: Conjunctiva clear, sclera non-icteric  Neck: trachea midline.  No bruit.  Pulmonary:  Good air movement, clear to auscultation bilaterally.  Cardiac: RRR, no JVD Vascular: port in left chest, no erythema Vessel Right Left  Radial Palpable Palpable                                   Gastrointestinal: soft, non-tender/non-distended. Musculoskeletal: M/S 5/5 throughout.  Extremities without ischemic changes.  No deformity or atrophy. No edema. Neurologic: Sensation grossly intact in extremities.  Symmetrical.  Speech is fluent. Motor exam as listed above. Psychiatric: Judgment intact, Mood & affect appropriate for pt's clinical situation. Dermatologic: No rashes or ulcers noted.  No cellulitis or open wounds.  Radiology No results found.  Labs Recent Results (from the past 2160 hour(s))  I-STAT creatinine     Status: Abnormal   Collection Time: 03/17/17 11:50 AM  Result Value Ref Range   Creatinine, Ser 1.30 (H) 0.44 - 1.00 mg/dL  Comprehensive metabolic panel     Status: Abnormal   Collection Time: 03/30/17  1:42 PM  Result Value Ref Range   Sodium 136 135 - 145 mmol/L   Potassium 4.4 3.5 - 5.1 mmol/L   Chloride 101 101 - 111 mmol/L   CO2 28 22 - 32 mmol/L   Glucose, Bld 194 (H) 65 - 99 mg/dL   BUN 27 (H) 6 - 20 mg/dL   Creatinine, Ser 1.77 (H) 0.44 - 1.00 mg/dL   Calcium 8.7 (L) 8.9 - 10.3 mg/dL   Total Protein 7.4 6.5 -  8.1 g/dL   Albumin 3.4 (L) 3.5 - 5.0 g/dL   AST 34 15 - 41 U/L   ALT 36 14 - 54 U/L   Alkaline Phosphatase 83 38 - 126 U/L   Total Bilirubin 0.7 0.3 - 1.2 mg/dL   GFR calc non Af Amer 27 (L) >60 mL/min   GFR calc Af Amer 31 (L) >60 mL/min    Comment: (NOTE) The eGFR has been calculated using the CKD EPI equation. This calculation has  not been validated in all clinical situations. eGFR's persistently <60 mL/min signify possible Chronic Kidney Disease.    Anion gap 7 5 - 15    Comment: Performed at [facility], [address] [place], [place] [address]  CBC with Differential     Status: Abnormal   Collection Time: 03/30/17  1:42 PM  Result Value Ref Range   WBC 5.0 3.6 - 11.0 K/uL   RBC 2.85 (L) 3.80 - 5.20 MIL/uL   Hemoglobin 8.9 (L) 12.0 - 16.0 g/dL   HCT 26.7 (L) 35.0 - 47.0 %   MCV 93.8 80.0 - 100.0 fL   MCH 31.4 26.0 - 34.0 pg   MCHC 33.5 32.0 - 36.0 g/dL   RDW 14.7 (H) 11.5 - 14.5 %   Platelets 278 150 - 440 K/uL   Neutrophils Relative % 79 %   Neutro Abs 3.9 1.4 - 6.5 K/uL   Lymphocytes Relative 8 %   Lymphs Abs 0.4 (L) 1.0 - 3.6 K/uL   Monocytes Relative 11 %   Monocytes Absolute 0.5 0.2 - 0.9 K/uL   Eosinophils Relative 2 %   Eosinophils Absolute 0.1 0 - 0.7 K/uL   Basophils Relative 0 %   Basophils Absolute 0.0 0 - 0.1 K/uL    Comment: Performed at [facility], [address] [place], [place] [address]  Troponin I     Status: None   Collection Time: 03/30/17  1:42 PM  Result Value Ref Range   Troponin I <0.03 <0.03 ng/mL    Comment: Performed at [facility], [address], [place], [place] [address]  Magnesium     Status: None   Collection Time: 03/30/17  1:42 PM  Result Value Ref Range   Magnesium 2.2 1.7 - 2.4 mg/dL    Comment: Performed at [facility], [address] [place], [place] [address]  Culture, blood (routine x 2)     Status: None   Collection Time: 03/30/17  1:43 PM  Result Value Ref Range   Specimen Description BLOOD BLOOD RIGHT FOREARM    Special Requests      BOTTLES DRAWN AEROBIC AND ANAEROBIC Blood Culture adequate volume   Culture      NO GROWTH 5 DAYS Performed at [facility], [address] [place], [place] [address]    Report Status 04/04/2017 FINAL   Culture, blood (routine x 2)     Status: None    Collection Time: 03/30/17  1:43 PM  Result Value Ref Range   Specimen Description BLOOD RIGHT ANTECUBITAL    Special Requests      BOTTLES DRAWN AEROBIC AND ANAEROBIC RIGHT ANTECUBITAL   Culture      NO GROWTH 5 DAYS Performed at [facility], [address] [place], [place] [address]    Report Status 04/04/2017 FINAL   Brain natriuretic peptide     Status: None   Collection Time: 03/30/17  1:43 PM  Result Value Ref Range   B  Natriuretic Peptide 37.0 0.0 - 100.0 pg/mL    Comment: Performed at [facility], [address] [place], [place] [address]  Lactic acid, plasma     Status: None   Collection Time: 03/30/17  1:43 PM  Result Value Ref Range   Lactic Acid, Venous 1.4 0.5 - 1.9 mmol/L    Comment: Performed at [facility], [address] [place], [place] [address]  Influenza panel by PCR (type A & B)     Status: None   Collection Time: 03/30/17  1:43 PM  Result Value Ref Range   Influenza A By PCR NEGATIVE NEGATIVE   Influenza B By PCR NEGATIVE NEGATIVE    Comment: (NOTE) The Xpert Xpress Flu assay is intended as an aid in the diagnosis of  influenza and should not be used as a sole basis for treatment.  This  assay is FDA approved for nasopharyngeal swab specimens only. Nasal  washings and aspirates are unacceptable for Xpert Xpress Flu testing. Performed at [facility], [address] [place], [place] [address]   Lactic acid, plasma     Status: None   Collection Time: 03/30/17  3:33 PM  Result Value Ref Range   Lactic Acid, Venous 1.4 0.5 - 1.9 mmol/L    Comment: Performed at [facility], [address] [place], [place] [address]  Fibrin derivatives D-[facility] only)     Status: Abnormal   Collection Time: 03/30/17  4:18 PM  Result Value Ref Range   Fibrin derivatives D-dimer ([facility]) 1,459.33 (H) 0.00 - 499.00 ng/mL (FEU)    Comment: (NOTE) <> Exclusion of Venous Thromboembolism (VTE) - OUTPATIENT ONLY    (Emergency Department or [place])   0-499 ng/ml (FEU): With a low to intermediate pretest probability                      for VTE this test result excludes the diagnosis                      of VTE.   >499 ng/ml (FEU) : VTE not excluded; additional work up for VTE is                      required. <> Testing on Inpatients and Evaluation of Disseminated Intravascular   Coagulation (DIC) Reference Range:   0-499 ng/ml (FEU) Performed at [facility], [address] [place],  [address]   Glucose, capillary     Status: Abnormal   Collection Time: 03/30/17  8:37 PM  Result Value Ref Range   Glucose-Capillary 287 (H) 65 - 99 mg/dL  Basic metabolic panel     Status: Abnormal   Collection Time: 03/31/17  4:23 AM  Result Value Ref Range   Sodium 135 135 - 145 mmol/L   Potassium 4.4 3.5 - 5.1 mmol/L   Chloride 103 101 - 111 mmol/L   CO2 26 22 - 32 mmol/L   Glucose, Bld 314 (H) 65 - 99 mg/dL   BUN 25 (H) 6 - 20 mg/dL   Creatinine, Ser 1.39 (H) 0.44 - 1.00 mg/dL   Calcium 8.1 (L) 8.9 - 10.3 mg/dL   GFR calc non Af Amer 36 (L) >60 mL/min   GFR calc Af Amer 42 (L) >60 mL/min    Comment: (NOTE) The eGFR has been calculated using the CKD EPI equation. This calculation has not been validated in all clinical situations. eGFR's persistently <60  mL/min signify possible Chronic Kidney Disease.    Anion gap 6 5 - 15    Comment: Performed at [facility], [address] [place], [place] [address]  CBC     Status: Abnormal   Collection Time: 03/31/17  4:23 AM  Result Value Ref Range   WBC 4.0 3.6 - 11.0 K/uL   RBC 2.43 (L) 3.80 - 5.20 MIL/uL   Hemoglobin 7.6 (L) 12.0 - 16.0 g/dL   HCT 22.7 (L) 35.0 - 47.0 %   MCV 93.3 80.0 - 100.0 fL   MCH 31.3 26.0 - 34.0 pg   MCHC 33.5 32.0 - 36.0 g/dL   RDW 14.6 (H) 11.5 - 14.5 %   Platelets 231 150 - 440 K/uL    Comment: Performed at [facility], [address] [place], [place] [address]  Ferritin     Status:  Abnormal   Collection Time: 03/31/17  4:23 AM  Result Value Ref Range   Ferritin 407 (H) 11 - 307 ng/mL    Comment: Performed at [facility], [address] [place], [place] [address]  Glucose, capillary     Status: Abnormal   Collection Time: 03/31/17  7:39 AM  Result Value Ref Range   Glucose-Capillary 363 (H) 65 - 99 mg/dL  Glucose, capillary     Status: Abnormal   Collection Time: 03/31/17 11:40 AM  Result Value Ref Range   Glucose-Capillary 475 (H) 65 - 99 mg/dL  Glucose, capillary     Status: Abnormal   Collection Time: 03/31/17 11:42 AM  Result Value Ref Range   Glucose-Capillary 483 (H) 65 - 99 mg/dL  Glucose, capillary     Status: Abnormal   Collection Time: 03/31/17  4:36 PM  Result Value Ref Range   Glucose-Capillary 430 (H) 65 - 99 mg/dL  Glucose, capillary     Status: Abnormal   Collection Time: 03/31/17  4:40 PM  Result Value Ref Range   Glucose-Capillary 442 (H) 65 - 99 mg/dL  Glucose, capillary     Status: Abnormal   Collection Time: 03/31/17  9:22 PM  Result Value Ref Range   Glucose-Capillary 329 (H) 65 - 99 mg/dL  Glucose, capillary     Status: Abnormal   Collection Time: 04/01/17  7:40 AM  Result Value Ref Range   Glucose-Capillary 250 (H) 65 - 99 mg/dL  Glucose, capillary     Status: Abnormal   Collection Time: 04/01/17 11:40 AM  Result Value Ref Range   Glucose-Capillary 392 (H) 65 - 99 mg/dL  CBC     Status: Abnormal   Collection Time: 04/01/17 11:57 AM  Result Value Ref Range   WBC 11.9 (H) 3.6 - 11.0 K/uL   RBC 2.56 (L) 3.80 - 5.20 MIL/uL   Hemoglobin 7.7 (L) 12.0 - 16.0 g/dL   HCT 24.0 (L) 35.0 - 47.0 %   MCV 93.6 80.0 - 100.0 fL   MCH 30.2 26.0 - 34.0 pg   MCHC 32.3 32.0 - 36.0 g/dL   RDW 14.7 (H) 11.5 - 14.5 %   Platelets 278 150 - 440 K/uL    Comment: Performed at [facility], [address] [place], [place] [address]  Basic metabolic panel     Status: Abnormal   Collection Time: 04/01/17 11:57 AM  Result Value  Ref Range   Sodium 134 (L) 135 - 145 mmol/L   Potassium 4.5 3.5 - 5.1 mmol/L   Chloride 99 (L) 101 - 111 mmol/L  CO2 27 22 - 32 mmol/L   Glucose, Bld 369 (H) 65 - 99 mg/dL   BUN 24 (H) 6 - 20 mg/dL   Creatinine, Ser 1.32 (H) 0.44 - 1.00 mg/dL   Calcium 8.8 (L) 8.9 - 10.3 mg/dL   GFR calc non Af Amer 38 (L) >60 mL/min   GFR calc Af Amer 45 (L) >60 mL/min    Comment: (NOTE) The eGFR has been calculated using the CKD EPI equation. This calculation has not been validated in all clinical situations. eGFR's persistently <60 mL/min signify possible Chronic Kidney Disease.    Anion gap 8 5 - 15    Comment: Performed at [facility], [address] [place], [place] [address]  Urinalysis, Complete w Microscopic     Status: Abnormal   Collection Time: 04/01/17  2:00 PM  Result Value Ref Range   Color, Urine YELLOW (A) YELLOW   APPearance CLEAR (A) CLEAR   Specific Gravity, Urine 1.010 1.005 - 1.030   pH 5.0 5.0 - 8.0   Glucose, UA >=500 (A) NEGATIVE mg/dL   Hgb urine dipstick NEGATIVE NEGATIVE   Bilirubin Urine NEGATIVE NEGATIVE   Ketones, ur NEGATIVE NEGATIVE mg/dL   Protein, ur NEGATIVE NEGATIVE mg/dL   Nitrite NEGATIVE NEGATIVE   Leukocytes, UA NEGATIVE NEGATIVE   RBC / HPF NONE SEEN 0 - 5 RBC/hpf   WBC, UA 0-5 0 - 5 WBC/hpf   Bacteria, UA NONE SEEN NONE SEEN   Squamous Epithelial / LPF 0-5 (A) NONE SEEN   Mucus PRESENT     Comment: Performed at [facility], [address] [place], [place] [address]  Urine culture     Status: Abnormal   Collection Time: 04/01/17  2:00 PM  Result Value Ref Range   Specimen Description      URINE, RANDOM Performed at [facility], [address] [place], [place] [address]    Special Requests      NONE Performed at [facility], [address] [place], [place] [address]    Culture MULTIPLE SPECIES PRESENT, SUGGEST RECOLLECTION (A)    Report Status 04/03/2017 FINAL   Glucose, capillary     Status:  Abnormal   Collection Time: 04/01/17  4:56 PM  Result Value Ref Range   Glucose-Capillary 282 (H) 65 - 99 mg/dL  Glucose, capillary     Status: Abnormal   Collection Time: 04/01/17  8:25 PM  Result Value Ref Range   Glucose-Capillary 409 (H) 65 - 99 mg/dL  Glucose, capillary     Status: Abnormal   Collection Time: 04/01/17  9:00 PM  Result Value Ref Range   Glucose-Capillary 448 (H) 65 - 99 mg/dL  Glucose, capillary     Status: Abnormal   Collection Time: 04/02/17  2:56 AM  Result Value Ref Range   Glucose-Capillary 238 (H) 65 - 99 mg/dL  CBC     Status: Abnormal   Collection Time: 04/02/17  5:10 AM  Result Value Ref Range   WBC 7.9 3.6 - 11.0 K/uL   RBC 2.49 (L) 3.80 - 5.20 MIL/uL   Hemoglobin 8.0 (L) 12.0 - 16.0 g/dL   HCT 23.3 (L) 35.0 - 47.0 %   MCV 93.6 80.0 - 100.0 fL   MCH 32.0 26.0 - 34.0 pg   MCHC 34.2 32.0 - 36.0 g/dL   RDW 15.0 (H) 11.5 - 14.5 %   Platelets 256 150 - 440 K/uL    Comment: Performed at [facility], [address] [place], [place] [address]  Type and screen [place]     Status: None   Collection Time: 04/02/17  5:10 AM  Result Value Ref Range   ABO/RH(D) O POS    Antibody Screen NEG    Sample Expiration      04/05/2017 Performed at [facility], [address] [place], [place] [address]   Glucose, capillary     Status: Abnormal   Collection Time: 04/02/17  7:30 AM  Result Value Ref Range   Glucose-Capillary 206 (H) 65 - 99 mg/dL  Glucose, capillary     Status: Abnormal   Collection Time: 04/02/17 11:55 AM  Result Value Ref Range   Glucose-Capillary 369 (H) 65 - 99 mg/dL  Glucose, capillary     Status: Abnormal   Collection Time: 04/02/17  4:48 PM  Result Value Ref Range   Glucose-Capillary 234 (H) 65 - 99 mg/dL  Glucose, capillary     Status: Abnormal   Collection Time: 04/02/17  8:23 PM  Result Value Ref Range   Glucose-Capillary 389 (H) 65 - 99 mg/dL  Glucose, capillary     Status: Abnormal    Collection Time: 04/03/17  3:37 AM  Result Value Ref Range   Glucose-Capillary 193 (H) 65 - 99 mg/dL  Hemoglobin     Status: Abnormal   Collection Time: 04/03/17  5:33 AM  Result Value Ref Range   Hemoglobin 8.4 (L) 12.0 - 16.0 g/dL    Comment: Performed at [facility], [address] [place], [place] [address]  Basic metabolic panel     Status: Abnormal   Collection Time: 04/03/17  5:33 AM  Result Value Ref Range   Sodium 137 135 - 145 mmol/L   Potassium 4.4 3.5 - 5.1 mmol/L   Chloride 99 (L) 101 - 111 mmol/L   CO2 30 22 - 32 mmol/L   Glucose, Bld 179 (H) 65 - 99 mg/dL   BUN 26 (H) 6 - 20 mg/dL   Creatinine, Ser 1.26 (H) 0.44 - 1.00 mg/dL   Calcium 8.9 8.9 - 10.3 mg/dL   GFR calc non Af Amer 41 (L) >60 mL/min   GFR calc Af Amer 47 (L) >60 mL/min    Comment: (NOTE) The eGFR has been calculated using the CKD EPI equation. This calculation has not been validated in all clinical situations. eGFR's persistently <60 mL/min signify possible Chronic Kidney Disease.    Anion gap 8 5 - 15    Comment: Performed at [facility], [address] [place],  [address]  Glucose, capillary     Status: Abnormal   Collection Time: 04/03/17  7:54 AM  Result Value Ref Range   Glucose-Capillary 161 (H) 65 - 99 mg/dL    Assessment/Plan:  Carotid stenosis Had an endarterectomy about 2-3 years ago and I cannot see where it has been checked since.  This can be done at her convenience in the near future  Essential (primary) hypertension blood pressure control important in reducing the progression of atherosclerotic disease. On appropriate oral medications.   Diabetes mellitus with polyneuropathy blood glucose control important in reducing the progression of atherosclerotic disease. Also, involved in wound healing. On appropriate medications.   HLD (hyperlipidemia) lipid control important in reducing the progression of atherosclerotic disease. Continue statin  therapy   Primary cancer of right upper lobe of lung ([place]) No longer using her port and desires to have this removed.  Risks and benefits of port removal  were discussed with the patient, and she is agreeable to proceed.  This has been scheduled for next week.      Leotis Pain 05/07/2017, 3:40 PM   This note was created with Dragon medical transcription system.  Any errors from dictation are unintentional.

## 2017-05-07 NOTE — Assessment & Plan Note (Signed)
blood pressure control important in reducing the progression of atherosclerotic disease. On appropriate oral medications.  

## 2017-05-07 NOTE — Assessment & Plan Note (Signed)
blood glucose control important in reducing the progression of atherosclerotic disease. Also, involved in wound healing. On appropriate medications.  

## 2017-05-07 NOTE — Assessment & Plan Note (Signed)
lipid control important in reducing the progression of atherosclerotic disease. Continue statin therapy  

## 2017-05-07 NOTE — Assessment & Plan Note (Signed)
No longer using her port and desires to have this removed.  Risks and benefits of port removal were discussed with the patient, and she is agreeable to proceed.  This has been scheduled for next week.

## 2017-05-07 NOTE — Assessment & Plan Note (Signed)
Had an endarterectomy about 2-3 years ago and I cannot see where it has been checked since.  This can be done at her convenience in the near future

## 2017-05-10 ENCOUNTER — Ambulatory Visit (INDEPENDENT_AMBULATORY_CARE_PROVIDER_SITE_OTHER): Payer: Medicare Other

## 2017-05-10 ENCOUNTER — Ambulatory Visit (INDEPENDENT_AMBULATORY_CARE_PROVIDER_SITE_OTHER): Payer: Medicare Other | Admitting: Vascular Surgery

## 2017-05-10 ENCOUNTER — Other Ambulatory Visit (INDEPENDENT_AMBULATORY_CARE_PROVIDER_SITE_OTHER): Payer: Self-pay | Admitting: Vascular Surgery

## 2017-05-10 ENCOUNTER — Encounter (INDEPENDENT_AMBULATORY_CARE_PROVIDER_SITE_OTHER): Payer: Self-pay | Admitting: Vascular Surgery

## 2017-05-10 ENCOUNTER — Telehealth: Payer: Self-pay | Admitting: Family Medicine

## 2017-05-10 VITALS — BP 111/51 | HR 60 | Resp 16 | Wt 147.0 lb

## 2017-05-10 DIAGNOSIS — E1342 Other specified diabetes mellitus with diabetic polyneuropathy: Secondary | ICD-10-CM

## 2017-05-10 DIAGNOSIS — I6523 Occlusion and stenosis of bilateral carotid arteries: Secondary | ICD-10-CM

## 2017-05-10 DIAGNOSIS — E782 Mixed hyperlipidemia: Secondary | ICD-10-CM | POA: Diagnosis not present

## 2017-05-10 NOTE — Progress Notes (Signed)
Subjective:    Patient ID: [name] [name], female    DOB: 1941/03/04, 77 y.o.   MRN: [id number] Chief Complaint  Patient presents with  . Follow-up    Carotid ultrasound    Patient presents for a yearly non-invasive study follow up for carotid stenosis. The stenosis has been followed by surveillance duplexes. The patient underwent a bilateral carotid duplex scan which showed no change from the previous exam on 08-26-16. Duplex is stable at 1-39% ICA stenosis bilaterally.  Both vertebral arteries were patent with antegrade flow.  Normal flow hemodynamics were seen in the bilateral subclavian arteries. The patient denies experiencing Amaurosis Fugax, TIA like symptoms or focal motor deficits.  The patient is on the schedule to have for Port-A-Cath removed on May 17, 2017.  At this time, the patient does not have any questions regarding the upcoming procedure.  The patient denies any fever, nausea vomiting.   Review of Systems  Constitutional: Negative.   HENT: Negative.   Eyes: Negative.   Respiratory: Negative.   Cardiovascular: Negative.   Gastrointestinal: Negative.   Endocrine: Negative.   Genitourinary: Negative.   Musculoskeletal: Negative.   Skin: Negative.   Allergic/Immunologic: Negative.   Neurological: Negative.   Hematological: Negative.   Psychiatric/Behavioral: Negative.       Objective:   Physical Exam  Constitutional: She is oriented to person, place, and time. She appears well-developed and well-nourished. No distress.  HENT:  Head: Normocephalic and atraumatic.  Eyes: Conjunctivae are normal. Pupils are equal, round, and reactive to light.  Neck: Normal range of motion.  No carotid bruits noted on exam  Cardiovascular: Normal rate, regular rhythm, normal heart sounds and intact distal pulses.  Pulses:      Radial pulses are 2+ on the right side, and 2+ on the left side.  Pulmonary/Chest: Effort normal and breath sounds normal.  Port-A-Cath in place.  No  signs of infection noted.  Musculoskeletal: Normal range of motion. She exhibits no edema.  Neurological: She is alert and oriented to person, place, and time.  Skin: Skin is warm and dry. She is not diaphoretic.  Psychiatric: She has a normal mood and affect. Her behavior is normal. Judgment and thought content normal.  Vitals reviewed.  BP (!) 111/51 (BP Location: Right Arm)   Pulse 60   Resp 16   Wt 147 lb (66.7 kg)   BMI 25.23 kg/m   Past Medical History:  Diagnosis Date  . Abnormal CT lung screening 10/17/2015  . COPD (chronic obstructive pulmonary disease) ([place])   . Coronary artery disease, non-occlusive    a. cath 2006: min nonobs CAD; b. cath 12/2010: cath LAD 50%, RCA 60%; c. 08/2013: Minimal luminal irregs, right dominant system with no significant CAD, diffuse luminal irregs noted. Normal EF 55%, no AS or MS.   . Diabetes mellitus   . Hyperlipemia    Followed by Dr. Rosanna Randy  . Hypertension   . Lung cancer ([place])   . Macular degeneration    rt  . Personal history of tobacco use, presenting hazards to health 10/15/2015  . Pneumonia    hx  . Shortness of breath   . Stroke [facility])    Social History   Socioeconomic History  . Marital status: Married    Spouse name: Not on file  . Number of children: Not on file  . Years of education: Not on file  . Highest education level: Not on file  Social Needs  . Financial resource strain:  Not on file  . Food insecurity - worry: Not on file  . Food insecurity - inability: Not on file  . Transportation needs - medical: Not on file  . Transportation needs - non-medical: Not on file  Occupational History  . Occupation: Retired, Licensed conveyancer: RETIRED  Tobacco Use  . Smoking status: Former Smoker    Packs/day: 1.00    Years: 50.00    Pack years: 50.00    Types: Cigarettes    Last attempt to quit: 10/03/2015    Years since quitting: 1.6  . Smokeless tobacco: Never Used  . Tobacco comment: smokes 3 cigs daily  05/06/15. Pt instructed to quit.  Substance and Sexual Activity  . Alcohol use: No    Alcohol/week: 0.0 oz  . Drug use: No  . Sexual activity: No  Other Topics Concern  . Not on file  Social History Narrative   Married with 4 children   Gets regular exercise   Past Surgical History:  Procedure Laterality Date  . ABDOMINAL HYSTERECTOMY    . ANTERIOR CERVICAL DECOMP/DISCECTOMY FUSION N/A 10/19/2013   Procedure: CERVICAL FIVE-SIX ANTERIOR CERVICAL DECOMPRESSION WITH FUSION INTERBODY PROSTHESIS PLATING AND PEEK CAGE;  Surgeon: Ophelia Charter, MD;  Location: [place] [facility];  Service: Neurosurgery;  Laterality: N/A;  . BACK SURGERY  80's  . BREAST CYST EXCISION Left    left negative   . CARDIAC CATHETERIZATION  05/2004  . CATARACT EXTRACTION Left   . CHOLECYSTECTOMY    . ENDARTERECTOMY Left 10/24/2014   Procedure: ENDARTERECTOMY CAROTID;  Surgeon: Algernon Huxley, MD;  Location: [facility] ORS;  Service: Vascular;  Laterality: Left;  . ENDOBRONCHIAL ULTRASOUND N/A 11/14/2015   Procedure: ENDOBRONCHIAL ULTRASOUND;  Surgeon: [name], MD;  Location: [facility] ORS;  Service: Cardiopulmonary;  Laterality: N/A;  . PERIPHERAL VASCULAR CATHETERIZATION N/A 12/04/2015   Procedure: Glori Luis Cath Insertion;  Surgeon: Algernon Huxley, MD;  Location: [place] [facility];  Service: Cardiovascular;  Laterality: N/A;  . TONSILLECTOMY AND ADENOIDECTOMY    . VESICOVAGINAL FISTULA CLOSURE W/ TAH     Family History  Problem Relation Age of Onset  . Stroke Mother        Massive  . Heart attack Father        Massive  . Heart attack Brother   . Heart attack Brother   . Lung cancer Maternal Grandfather   . Heart attack Paternal Grandmother        MI   Allergies  Allergen Reactions  . Coconut Fatty Acids Swelling    Throat swells      Assessment & Plan:  Patient presents for a yearly non-invasive study follow up for carotid stenosis. The stenosis has been followed by surveillance duplexes. The patient underwent a  bilateral carotid duplex scan which showed no change from the previous exam on 08-26-16. Duplex is stable at 1-39% ICA stenosis bilaterally.  Both vertebral arteries were patent with antegrade flow.  Normal flow hemodynamics were seen in the bilateral subclavian arteries. The patient denies experiencing Amaurosis Fugax, TIA like symptoms or focal motor deficits.  The patient is on the schedule to have for Port-A-Cath removed on May 17, 2017.  At this time, the patient does not have any questions regarding the upcoming procedure.  The patient denies any fever, nausea vomiting.  1. Bilateral carotid artery stenosis - Stable Studies reviewed with patient. Patient asymptomatic with stable duplex.  No intervention at this time. Patient to return in one year  for surveillance carotid duplex. Patient to remain abstinent of tobacco use. I have discussed with the patient at length the risk factors for and pathogenesis of atherosclerotic disease and encouraged a healthy diet, regular exercise regimen and blood pressure / glucose control.  Patient was instructed to contact our office in the interim with problems such as arm / leg weakness or numbness, speech / swallowing difficulty or temporary monocular blindness. The patient expresses their understanding.  - VAS US CAROTID; Future  2. Diabetic polyneuropathy associated with other specified diabetes mellitus ([place]) - Stable Encouraged good control as its slows the progression of atherosclerotic disease  3. Mixed hyperlipidemia - Stable Encouraged good control as its slows the progression of atherosclerotic disease  Current Outpatient Medications on File Prior to Visit  Medication Sig Dispense Refill  . albuterol (PROVENTIL) (5 MG/ML) 0.5% nebulizer solution Take 0.5 mLs (2.5 mg total) by nebulization every 6 (six) hours as needed for wheezing or shortness of breath. 100 vial 0  . atorvastatin (LIPITOR) 40 MG tablet Take 0.5 tablets (20 mg total) by  mouth at bedtime. 90 tablet 3  . budesonide-formoterol (SYMBICORT) 160-4.5 MCG/ACT inhaler Inhale 2 puffs into the lungs 2 (two) times daily. 1 Inhaler 0  . diazepam (VALIUM) 5 MG tablet Take 1 tablet (5 mg total) by mouth every 12 (twelve) hours as needed for muscle spasms. 30 tablet 5  . gabapentin (NEURONTIN) 600 MG tablet Take 0.5 tablets (300 mg total) by mouth 2 (two) times daily. 60 tablet 2  . glimepiride (AMARYL) 2 MG tablet Take 1 tablet (2 mg total) by mouth 2 (two) times daily. 60 tablet 2  . HYDROcodone-acetaminophen (NORCO) 5-325 MG tablet Take 1 tablet by mouth every 6 (six) hours as needed for moderate pain or severe pain. 20 tablet 0  . isosorbide mononitrate (IMDUR) 30 MG 24 hr tablet TAKE 1 TABLET BY MOUTH  DAILY 90 tablet 0  . magnesium oxide (MAG-OX) 400 (241.3 MG) MG tablet Take 1 tablet by mouth 2 (two) times daily.    . metoprolol tartrate (LOPRESSOR) 25 MG tablet Take 1 tablet (25 mg total) by mouth 2 (two) times daily. 180 tablet 3  . mometasone (ELOCON) 0.1 % cream Apply small amount nightly to each ear 45 g 0  . Multiple Vitamins-Minerals (ICAPS) CAPS Take 2 capsules by mouth daily. Reported on 04/17/2015    . nitroGLYCERIN (NITROSTAT) 0.4 MG SL tablet Place 1 tablet (0.4 mg total) under the tongue every 5 (five) minutes as needed for chest pain. 25 tablet 3  . nystatin cream (MYCOSTATIN) Apply 1 application topically 2 (two) times daily. 30 g 0  . ondansetron (ZOFRAN) 8 MG tablet Take 1 tablet (8 mg total) by mouth every 8 (eight) hours as needed for nausea or vomiting. 30 tablet 1  . ONE TOUCH ULTRA TEST test strip USE TO CHECK BLOOD SUGAR ONCE DAILY 100 each 11  . pantoprazole (PROTONIX) 40 MG tablet TAKE 1 TABLET BY MOUTH  DAILY 90 tablet 3  . predniSONE (DELTASONE) 10 MG tablet Take 2 tabs po day1; 1 tab po day2; 1/2 tab po day3,4 5 tablet 0  . amoxicillin-clavulanate (AUGMENTIN) 500-125 MG tablet Take 1 tablet (500 mg total) by mouth 2 (two) times daily. (Patient not  taking: Reported on 05/07/2017) 16 tablet 0   Current Facility-Administered Medications on File Prior to Visit  Medication Dose Route Frequency Provider Last Rate Last Dose  . ondansetron (ZOFRAN) 8 mg, dexamethasone (DECADRON) 10 mg in sodium chloride 0.9 %  50 mL IVPB   Intravenous Once Lloyd Huger, MD       There are no Patient Instructions on file for this visit. No Follow-up on file.  Karyss Frese A Jaime Grizzell, PA-C

## 2017-05-10 NOTE — Telephone Encounter (Signed)
Please advise. Thanks.  

## 2017-05-10 NOTE — Telephone Encounter (Signed)
[name] [name] is wanting to get 2 more visits approved for occupational therapy.  Please call back with approval to [phone]

## 2017-05-11 DIAGNOSIS — J441 Chronic obstructive pulmonary disease with (acute) exacerbation: Secondary | ICD-10-CM | POA: Diagnosis not present

## 2017-05-11 DIAGNOSIS — Z85110 Personal history of malignant carcinoid tumor of bronchus and lung: Secondary | ICD-10-CM | POA: Diagnosis not present

## 2017-05-11 DIAGNOSIS — I129 Hypertensive chronic kidney disease with stage 1 through stage 4 chronic kidney disease, or unspecified chronic kidney disease: Secondary | ICD-10-CM | POA: Diagnosis not present

## 2017-05-11 DIAGNOSIS — N183 Chronic kidney disease, stage 3 (moderate): Secondary | ICD-10-CM | POA: Diagnosis not present

## 2017-05-11 DIAGNOSIS — Z9981 Dependence on supplemental oxygen: Secondary | ICD-10-CM | POA: Diagnosis not present

## 2017-05-11 DIAGNOSIS — Z79891 Long term (current) use of opiate analgesic: Secondary | ICD-10-CM | POA: Diagnosis not present

## 2017-05-11 DIAGNOSIS — D631 Anemia in chronic kidney disease: Secondary | ICD-10-CM | POA: Diagnosis not present

## 2017-05-11 DIAGNOSIS — Z7952 Long term (current) use of systemic steroids: Secondary | ICD-10-CM | POA: Diagnosis not present

## 2017-05-11 DIAGNOSIS — E1122 Type 2 diabetes mellitus with diabetic chronic kidney disease: Secondary | ICD-10-CM | POA: Diagnosis not present

## 2017-05-11 DIAGNOSIS — Z7984 Long term (current) use of oral hypoglycemic drugs: Secondary | ICD-10-CM | POA: Diagnosis not present

## 2017-05-11 DIAGNOSIS — I2510 Atherosclerotic heart disease of native coronary artery without angina pectoris: Secondary | ICD-10-CM | POA: Diagnosis not present

## 2017-05-11 DIAGNOSIS — Z9181 History of falling: Secondary | ICD-10-CM | POA: Diagnosis not present

## 2017-05-11 DIAGNOSIS — Z8673 Personal history of transient ischemic attack (TIA), and cerebral infarction without residual deficits: Secondary | ICD-10-CM | POA: Diagnosis not present

## 2017-05-11 DIAGNOSIS — Z7951 Long term (current) use of inhaled steroids: Secondary | ICD-10-CM | POA: Diagnosis not present

## 2017-05-11 DIAGNOSIS — Z87891 Personal history of nicotine dependence: Secondary | ICD-10-CM | POA: Diagnosis not present

## 2017-05-11 DIAGNOSIS — E1140 Type 2 diabetes mellitus with diabetic neuropathy, unspecified: Secondary | ICD-10-CM | POA: Diagnosis not present

## 2017-05-11 NOTE — Telephone Encounter (Signed)
ok 

## 2017-05-11 NOTE — Telephone Encounter (Signed)
Tried to call to give orders, mailbox was full. Will try again later.

## 2017-05-12 DIAGNOSIS — Z8673 Personal history of transient ischemic attack (TIA), and cerebral infarction without residual deficits: Secondary | ICD-10-CM | POA: Diagnosis not present

## 2017-05-12 DIAGNOSIS — Z7951 Long term (current) use of inhaled steroids: Secondary | ICD-10-CM | POA: Diagnosis not present

## 2017-05-12 DIAGNOSIS — E1140 Type 2 diabetes mellitus with diabetic neuropathy, unspecified: Secondary | ICD-10-CM | POA: Diagnosis not present

## 2017-05-12 DIAGNOSIS — Z85110 Personal history of malignant carcinoid tumor of bronchus and lung: Secondary | ICD-10-CM | POA: Diagnosis not present

## 2017-05-12 DIAGNOSIS — Z9181 History of falling: Secondary | ICD-10-CM | POA: Diagnosis not present

## 2017-05-12 DIAGNOSIS — D631 Anemia in chronic kidney disease: Secondary | ICD-10-CM | POA: Diagnosis not present

## 2017-05-12 DIAGNOSIS — I2510 Atherosclerotic heart disease of native coronary artery without angina pectoris: Secondary | ICD-10-CM | POA: Diagnosis not present

## 2017-05-12 DIAGNOSIS — E1122 Type 2 diabetes mellitus with diabetic chronic kidney disease: Secondary | ICD-10-CM | POA: Diagnosis not present

## 2017-05-12 DIAGNOSIS — Z87891 Personal history of nicotine dependence: Secondary | ICD-10-CM | POA: Diagnosis not present

## 2017-05-12 DIAGNOSIS — Z7952 Long term (current) use of systemic steroids: Secondary | ICD-10-CM | POA: Diagnosis not present

## 2017-05-12 DIAGNOSIS — J441 Chronic obstructive pulmonary disease with (acute) exacerbation: Secondary | ICD-10-CM | POA: Diagnosis not present

## 2017-05-12 DIAGNOSIS — Z9981 Dependence on supplemental oxygen: Secondary | ICD-10-CM | POA: Diagnosis not present

## 2017-05-12 DIAGNOSIS — Z79891 Long term (current) use of opiate analgesic: Secondary | ICD-10-CM | POA: Diagnosis not present

## 2017-05-12 DIAGNOSIS — Z7984 Long term (current) use of oral hypoglycemic drugs: Secondary | ICD-10-CM | POA: Diagnosis not present

## 2017-05-12 DIAGNOSIS — N183 Chronic kidney disease, stage 3 (moderate): Secondary | ICD-10-CM | POA: Diagnosis not present

## 2017-05-12 DIAGNOSIS — I129 Hypertensive chronic kidney disease with stage 1 through stage 4 chronic kidney disease, or unspecified chronic kidney disease: Secondary | ICD-10-CM | POA: Diagnosis not present

## 2017-05-13 DIAGNOSIS — Z8673 Personal history of transient ischemic attack (TIA), and cerebral infarction without residual deficits: Secondary | ICD-10-CM | POA: Diagnosis not present

## 2017-05-13 DIAGNOSIS — Z9181 History of falling: Secondary | ICD-10-CM | POA: Diagnosis not present

## 2017-05-13 DIAGNOSIS — I129 Hypertensive chronic kidney disease with stage 1 through stage 4 chronic kidney disease, or unspecified chronic kidney disease: Secondary | ICD-10-CM | POA: Diagnosis not present

## 2017-05-13 DIAGNOSIS — Z79891 Long term (current) use of opiate analgesic: Secondary | ICD-10-CM | POA: Diagnosis not present

## 2017-05-13 DIAGNOSIS — Z87891 Personal history of nicotine dependence: Secondary | ICD-10-CM | POA: Diagnosis not present

## 2017-05-13 DIAGNOSIS — Z7952 Long term (current) use of systemic steroids: Secondary | ICD-10-CM | POA: Diagnosis not present

## 2017-05-13 DIAGNOSIS — E1122 Type 2 diabetes mellitus with diabetic chronic kidney disease: Secondary | ICD-10-CM | POA: Diagnosis not present

## 2017-05-13 DIAGNOSIS — Z9981 Dependence on supplemental oxygen: Secondary | ICD-10-CM | POA: Diagnosis not present

## 2017-05-13 DIAGNOSIS — I2510 Atherosclerotic heart disease of native coronary artery without angina pectoris: Secondary | ICD-10-CM | POA: Diagnosis not present

## 2017-05-13 DIAGNOSIS — Z7984 Long term (current) use of oral hypoglycemic drugs: Secondary | ICD-10-CM | POA: Diagnosis not present

## 2017-05-13 DIAGNOSIS — D631 Anemia in chronic kidney disease: Secondary | ICD-10-CM | POA: Diagnosis not present

## 2017-05-13 DIAGNOSIS — N183 Chronic kidney disease, stage 3 (moderate): Secondary | ICD-10-CM | POA: Diagnosis not present

## 2017-05-13 DIAGNOSIS — E1140 Type 2 diabetes mellitus with diabetic neuropathy, unspecified: Secondary | ICD-10-CM | POA: Diagnosis not present

## 2017-05-13 DIAGNOSIS — Z7951 Long term (current) use of inhaled steroids: Secondary | ICD-10-CM | POA: Diagnosis not present

## 2017-05-13 DIAGNOSIS — Z85110 Personal history of malignant carcinoid tumor of bronchus and lung: Secondary | ICD-10-CM | POA: Diagnosis not present

## 2017-05-13 DIAGNOSIS — J441 Chronic obstructive pulmonary disease with (acute) exacerbation: Secondary | ICD-10-CM | POA: Diagnosis not present

## 2017-05-13 NOTE — Telephone Encounter (Signed)
Tried calling again and VM is full.

## 2017-05-14 DIAGNOSIS — E1122 Type 2 diabetes mellitus with diabetic chronic kidney disease: Secondary | ICD-10-CM | POA: Diagnosis not present

## 2017-05-14 DIAGNOSIS — Z87891 Personal history of nicotine dependence: Secondary | ICD-10-CM | POA: Diagnosis not present

## 2017-05-14 DIAGNOSIS — I129 Hypertensive chronic kidney disease with stage 1 through stage 4 chronic kidney disease, or unspecified chronic kidney disease: Secondary | ICD-10-CM | POA: Diagnosis not present

## 2017-05-14 DIAGNOSIS — E1140 Type 2 diabetes mellitus with diabetic neuropathy, unspecified: Secondary | ICD-10-CM | POA: Diagnosis not present

## 2017-05-14 DIAGNOSIS — Z79891 Long term (current) use of opiate analgesic: Secondary | ICD-10-CM | POA: Diagnosis not present

## 2017-05-14 DIAGNOSIS — Z85110 Personal history of malignant carcinoid tumor of bronchus and lung: Secondary | ICD-10-CM | POA: Diagnosis not present

## 2017-05-14 DIAGNOSIS — Z7951 Long term (current) use of inhaled steroids: Secondary | ICD-10-CM | POA: Diagnosis not present

## 2017-05-14 DIAGNOSIS — Z9981 Dependence on supplemental oxygen: Secondary | ICD-10-CM | POA: Diagnosis not present

## 2017-05-14 DIAGNOSIS — Z9181 History of falling: Secondary | ICD-10-CM | POA: Diagnosis not present

## 2017-05-14 DIAGNOSIS — I2510 Atherosclerotic heart disease of native coronary artery without angina pectoris: Secondary | ICD-10-CM | POA: Diagnosis not present

## 2017-05-14 DIAGNOSIS — Z7952 Long term (current) use of systemic steroids: Secondary | ICD-10-CM | POA: Diagnosis not present

## 2017-05-14 DIAGNOSIS — Z7984 Long term (current) use of oral hypoglycemic drugs: Secondary | ICD-10-CM | POA: Diagnosis not present

## 2017-05-14 DIAGNOSIS — N183 Chronic kidney disease, stage 3 (moderate): Secondary | ICD-10-CM | POA: Diagnosis not present

## 2017-05-14 DIAGNOSIS — D631 Anemia in chronic kidney disease: Secondary | ICD-10-CM | POA: Diagnosis not present

## 2017-05-14 DIAGNOSIS — J441 Chronic obstructive pulmonary disease with (acute) exacerbation: Secondary | ICD-10-CM | POA: Diagnosis not present

## 2017-05-14 DIAGNOSIS — Z8673 Personal history of transient ischemic attack (TIA), and cerebral infarction without residual deficits: Secondary | ICD-10-CM | POA: Diagnosis not present

## 2017-05-14 NOTE — Telephone Encounter (Signed)
Tried calling again, no answer and vm full.

## 2017-05-16 MED ORDER — CEFAZOLIN SODIUM-DEXTROSE 2-4 GM/100ML-% IV SOLN
2.0000 g | Freq: Once | INTRAVENOUS | Status: AC
  Administered 2017-05-17: 2 g via INTRAVENOUS

## 2017-05-17 ENCOUNTER — Ambulatory Visit
Admission: RE | Admit: 2017-05-17 | Discharge: 2017-05-17 | Disposition: A | Discharge status: Home / Self Care | Payer: Medicare Other | Source: Ambulatory Visit | Attending: Vascular Surgery | Admitting: Vascular Surgery

## 2017-05-17 ENCOUNTER — Encounter: Payer: Self-pay | Admitting: Vascular Surgery

## 2017-05-17 ENCOUNTER — Other Ambulatory Visit: Payer: Self-pay | Admitting: Family Medicine

## 2017-05-17 ENCOUNTER — Encounter
Admission: RE | Disposition: A | Discharge status: Home / Self Care | Payer: Self-pay | Source: Ambulatory Visit | Attending: Vascular Surgery

## 2017-05-17 DIAGNOSIS — Z87891 Personal history of nicotine dependence: Secondary | ICD-10-CM | POA: Insufficient documentation

## 2017-05-17 DIAGNOSIS — Z91018 Allergy to other foods: Secondary | ICD-10-CM | POA: Insufficient documentation

## 2017-05-17 DIAGNOSIS — Z90710 Acquired absence of both cervix and uterus: Secondary | ICD-10-CM | POA: Diagnosis not present

## 2017-05-17 DIAGNOSIS — E782 Mixed hyperlipidemia: Secondary | ICD-10-CM | POA: Insufficient documentation

## 2017-05-17 DIAGNOSIS — Z85118 Personal history of other malignant neoplasm of bronchus and lung: Secondary | ICD-10-CM | POA: Insufficient documentation

## 2017-05-17 DIAGNOSIS — Z79899 Other long term (current) drug therapy: Secondary | ICD-10-CM | POA: Diagnosis not present

## 2017-05-17 DIAGNOSIS — I10 Essential (primary) hypertension: Secondary | ICD-10-CM | POA: Insufficient documentation

## 2017-05-17 DIAGNOSIS — Z8249 Family history of ischemic heart disease and other diseases of the circulatory system: Secondary | ICD-10-CM | POA: Diagnosis not present

## 2017-05-17 DIAGNOSIS — Z823 Family history of stroke: Secondary | ICD-10-CM | POA: Insufficient documentation

## 2017-05-17 DIAGNOSIS — M5442 Lumbago with sciatica, left side: Secondary | ICD-10-CM

## 2017-05-17 DIAGNOSIS — I6523 Occlusion and stenosis of bilateral carotid arteries: Secondary | ICD-10-CM | POA: Insufficient documentation

## 2017-05-17 DIAGNOSIS — Z9842 Cataract extraction status, left eye: Secondary | ICD-10-CM | POA: Insufficient documentation

## 2017-05-17 DIAGNOSIS — I2510 Atherosclerotic heart disease of native coronary artery without angina pectoris: Secondary | ICD-10-CM | POA: Insufficient documentation

## 2017-05-17 DIAGNOSIS — Z9049 Acquired absence of other specified parts of digestive tract: Secondary | ICD-10-CM | POA: Diagnosis not present

## 2017-05-17 DIAGNOSIS — J449 Chronic obstructive pulmonary disease, unspecified: Secondary | ICD-10-CM | POA: Insufficient documentation

## 2017-05-17 DIAGNOSIS — Z981 Arthrodesis status: Secondary | ICD-10-CM | POA: Diagnosis not present

## 2017-05-17 DIAGNOSIS — G8929 Other chronic pain: Secondary | ICD-10-CM

## 2017-05-17 DIAGNOSIS — C3490 Malignant neoplasm of unspecified part of unspecified bronchus or lung: Secondary | ICD-10-CM

## 2017-05-17 DIAGNOSIS — Z8673 Personal history of transient ischemic attack (TIA), and cerebral infarction without residual deficits: Secondary | ICD-10-CM | POA: Diagnosis not present

## 2017-05-17 DIAGNOSIS — E1342 Other specified diabetes mellitus with diabetic polyneuropathy: Secondary | ICD-10-CM | POA: Insufficient documentation

## 2017-05-17 DIAGNOSIS — Z452 Encounter for adjustment and management of vascular access device: Secondary | ICD-10-CM | POA: Insufficient documentation

## 2017-05-17 DIAGNOSIS — Z9889 Other specified postprocedural states: Secondary | ICD-10-CM | POA: Diagnosis not present

## 2017-05-17 DIAGNOSIS — M5441 Lumbago with sciatica, right side: Secondary | ICD-10-CM

## 2017-05-17 HISTORY — PX: PORTA CATH REMOVAL: CATH118286

## 2017-05-17 LAB — GLUCOSE, CAPILLARY: Glucose-Capillary: 173 mg/dL — ABNORMAL HIGH (ref 65–99)

## 2017-05-17 SURGERY — PORTA CATH REMOVAL
Anesthesia: Moderate Sedation

## 2017-05-17 MED ORDER — ONDANSETRON HCL 4 MG/2ML IJ SOLN: 4.0000 mg | Freq: Four times a day (QID) | INTRAMUSCULAR | Status: DC | PRN

## 2017-05-17 MED ORDER — MIDAZOLAM HCL 5 MG/5ML IJ SOLN
INTRAMUSCULAR | Status: AC
  Filled 2017-05-17: qty 5

## 2017-05-17 MED ORDER — HYDROMORPHONE HCL 1 MG/ML IJ SOLN: 1.0000 mg | Freq: Once | INTRAMUSCULAR | Status: DC | PRN

## 2017-05-17 MED ORDER — HEPARIN (PORCINE) IN NACL 2-0.9 UNIT/ML-% IJ SOLN
INTRAMUSCULAR | Status: AC
  Filled 2017-05-17: qty 500

## 2017-05-17 MED ORDER — FENTANYL CITRATE (PF) 100 MCG/2ML IJ SOLN
INTRAMUSCULAR | Status: DC | PRN
  Administered 2017-05-17: 50 ug via INTRAVENOUS

## 2017-05-17 MED ORDER — SODIUM CHLORIDE 0.9 % IV SOLN
INTRAVENOUS | Status: DC
  Administered 2017-05-17: 10:00:00 via INTRAVENOUS

## 2017-05-17 MED ORDER — LIDOCAINE-EPINEPHRINE (PF) 1 %-1:200000 IJ SOLN
INTRAMUSCULAR | Status: AC
  Filled 2017-05-17: qty 30

## 2017-05-17 MED ORDER — FENTANYL CITRATE (PF) 100 MCG/2ML IJ SOLN
INTRAMUSCULAR | Status: AC
  Filled 2017-05-17: qty 2

## 2017-05-17 MED ORDER — MIDAZOLAM HCL 2 MG/2ML IJ SOLN
INTRAMUSCULAR | Status: DC | PRN
  Administered 2017-05-17: 2 mg via INTRAVENOUS

## 2017-05-17 SURGICAL SUPPLY — 7 items
DERMABOND ADVANCED (GAUZE/BANDAGES/DRESSINGS) ×1
DERMABOND ADVANCED .7 DNX12 (GAUZE/BANDAGES/DRESSINGS) ×1 IMPLANT
PAD GROUND ADULT SPLIT (MISCELLANEOUS) ×2 IMPLANT
PENCIL ELECTRO HAND CTR (MISCELLANEOUS) ×2 IMPLANT
SPONGE XRAY 4X4 16PLY STRL (MISCELLANEOUS) ×2 IMPLANT
SUT VIC AB 4-0 PS2 18 (SUTURE) ×2 IMPLANT
SUT VICRYL+ 3-0 36IN CT-1 (SUTURE) ×2 IMPLANT

## 2017-05-17 NOTE — Op Note (Signed)
[facility]       Operative Note  Date: 05/17/2017  Preoperative diagnosis:  1. Lung cancer, no longer using port  Postoperative diagnosis:  Same as above  Procedures: #1. Removal of left jugular port a cath   Surgeon: Leotis Pain, MD  Anesthesia: Local with moderate conscious sedation for 15 minutes using 2 mg of Versed and 50 mcg of Fentanyl  Fluoroscopy time: none  Contrast used: 0  Estimated blood loss: Minimal  Indication for the procedure:  The patient is a 77 y.o. female who has lung cancer and is done with chemotherapy and no longer needs their Port-A-Cath. The patient desires to have this removed. Risks and benefits including need for potential replacement with recurrent disease were discussed and patient is agreeable to proceed.  Description of procedure: The patient was brought to the vascular and interventional radiology suite. Moderate conscious sedation was administered during a face to face encounter with the patient throughout the procedure with my supervision of the RN administering medicines and monitoring the patient's vital signs, pulse oximetry, telemetry and mental status throughout from the start of the procedure until the patient was taken to the recovery room.  The left neck chest and shoulder were sterilely prepped and draped, and a sterile surgical field was created. The area was then anesthetized with 1% lidocaine copiously. The previous incision was reopened and electrocautery used to dissected down to the port and the catheter. These were dissected free and the catheter was gently removed from the vein in its entirety. The port was dissected out from the fibrous connective tissue and the Prolene sutures were removed. The port was then removed in its entirety including the catheter. The wound was then closed with a 3-0 Vicryl and a 4-0 Monocryl and Dermabond was placed as a dressing. The patient was  then taken to the recovery room in stable condition having tolerated the procedure well.  Complications: none  Condition: stable   Leotis Pain, MD 05/17/2017 10:39 AM   This note was created with Dragon Medical transcription system. Any errors in dictation are purely unintentional.

## 2017-05-17 NOTE — Telephone Encounter (Signed)
Patient is requesting the following medications  atorvastatin (LIPITOR) 40 MG tablet  metoprolol tartrate (LOPRESSOR) 25 MG tablet  isosorbide mononitrate (IMDUR) 30 MG 24 hr tablet  glimepiride (AMARYL) 2 MG tablet  gabapentin (NEURONTIN) 600 MG tablet  pantoprazole (PROTONIX) 40 MG tablet  Patient is completely out of these medications.  She states that the pharmacy said they sent requests two weeks ago.  She uses [facility]

## 2017-05-17 NOTE — H&P (Signed)
[facility] History & Physical Update  The patient was interviewed and re-examined.  The patient's previous History and Physical has been reviewed and is unchanged.  There is no change in the plan of care. We plan to proceed with the scheduled procedure.  Leotis Pain, MD  05/17/2017, 9:29 AM

## 2017-05-17 NOTE — Discharge Instructions (Signed)
Implanted Port Removal, Care After °Refer to this sheet in the next few weeks. These instructions provide you with information about caring for yourself after your procedure. Your health care provider may also give you more specific instructions. Your treatment has been planned according to current medical practices, but problems sometimes occur. Call your health care provider if you have any problems or questions after your procedure. °What can I expect after the procedure? °After the procedure, it is common to have: °· Soreness or pain near your incision. °· Some swelling or bruising near your incision. ° °Follow these instructions at home: °Medicines °· Take over-the-counter and prescription medicines only as told by your health care provider. °· If you were prescribed an antibiotic medicine, take it as told by your health care provider. Do not stop taking the antibiotic even if you start to feel better. °Bathing °· Do not take baths, swim, or use a hot tub until your health care provider approves. Ask your health care provider if you can take showers. You may only be allowed to take sponge baths for bathing. °Incision care °· Follow instructions from your health care provider about how to take care of your incision. Make sure you: °? Wash your hands with soap and water before you change your bandage (dressing). If soap and water are not available, use hand sanitizer. °? Change your dressing as told by your health care provider. °? Keep your dressing dry. °? Leave stitches (sutures), skin glue, or adhesive strips in place. These skin closures may need to stay in place for 2 weeks or longer. If adhesive strip edges start to loosen and curl up, you may trim the loose edges. Do not remove adhesive strips completely unless your health care provider tells you to do that. °· Check your incision area every day for signs of infection. Check for: °? More redness, swelling, or pain. °? More fluid or  blood. °? Warmth. °? Pus or a bad smell. °Driving °· If you received a sedative, do not drive for 24 hours after the procedure. °· If you did not receive a sedative, ask your health care provider when it is safe to drive. °Activity °· Return to your normal activities as told by your health care provider. Ask your health care provider what activities are safe for you. °· Until your health care provider says it is safe: °? Do not lift anything that is heavier than 10 lb (4.5 kg). °? Do not do activities that involve lifting your arms over your head. °General instructions °· Do not use any tobacco products, such as cigarettes, chewing tobacco, and e-cigarettes. Tobacco can delay healing. If you need help quitting, ask your health care provider. °· Keep all follow-up visits as told by your health care provider. This is important. °Contact a health care provider if: °· You have more redness, swelling, or pain around your incision. °· You have more fluid or blood coming from your incision. °· Your incision feels warm to the touch. °· You have pus or a bad smell coming from your incision. °· You have a fever. °· You have pain that is not relieved by your pain medicine. °Get help right away if: °· You have chest pain. °· You have difficulty breathing. °This information is not intended to replace advice given to you by your health care provider. Make sure you discuss any questions you have with your health care provider. °Document Released: 03/04/2015 Document Revised: 08/29/2015 Document Reviewed: 12/26/2014 °[employer] Interactive Patient   Education © 2018 [employer] ° °

## 2017-05-18 DIAGNOSIS — J441 Chronic obstructive pulmonary disease with (acute) exacerbation: Secondary | ICD-10-CM | POA: Diagnosis not present

## 2017-05-18 DIAGNOSIS — E1122 Type 2 diabetes mellitus with diabetic chronic kidney disease: Secondary | ICD-10-CM | POA: Diagnosis not present

## 2017-05-18 DIAGNOSIS — I129 Hypertensive chronic kidney disease with stage 1 through stage 4 chronic kidney disease, or unspecified chronic kidney disease: Secondary | ICD-10-CM | POA: Diagnosis not present

## 2017-05-18 DIAGNOSIS — E1140 Type 2 diabetes mellitus with diabetic neuropathy, unspecified: Secondary | ICD-10-CM | POA: Diagnosis not present

## 2017-05-18 DIAGNOSIS — Z7951 Long term (current) use of inhaled steroids: Secondary | ICD-10-CM | POA: Diagnosis not present

## 2017-05-18 DIAGNOSIS — Z7984 Long term (current) use of oral hypoglycemic drugs: Secondary | ICD-10-CM | POA: Diagnosis not present

## 2017-05-18 DIAGNOSIS — Z87891 Personal history of nicotine dependence: Secondary | ICD-10-CM | POA: Diagnosis not present

## 2017-05-18 DIAGNOSIS — D631 Anemia in chronic kidney disease: Secondary | ICD-10-CM | POA: Diagnosis not present

## 2017-05-18 DIAGNOSIS — Z9181 History of falling: Secondary | ICD-10-CM | POA: Diagnosis not present

## 2017-05-18 DIAGNOSIS — I2510 Atherosclerotic heart disease of native coronary artery without angina pectoris: Secondary | ICD-10-CM | POA: Diagnosis not present

## 2017-05-18 DIAGNOSIS — Z85110 Personal history of malignant carcinoid tumor of bronchus and lung: Secondary | ICD-10-CM | POA: Diagnosis not present

## 2017-05-18 DIAGNOSIS — N183 Chronic kidney disease, stage 3 (moderate): Secondary | ICD-10-CM | POA: Diagnosis not present

## 2017-05-18 DIAGNOSIS — Z8673 Personal history of transient ischemic attack (TIA), and cerebral infarction without residual deficits: Secondary | ICD-10-CM | POA: Diagnosis not present

## 2017-05-18 DIAGNOSIS — Z7952 Long term (current) use of systemic steroids: Secondary | ICD-10-CM | POA: Diagnosis not present

## 2017-05-18 DIAGNOSIS — Z79891 Long term (current) use of opiate analgesic: Secondary | ICD-10-CM | POA: Diagnosis not present

## 2017-05-18 DIAGNOSIS — Z9981 Dependence on supplemental oxygen: Secondary | ICD-10-CM | POA: Diagnosis not present

## 2017-05-18 MED ORDER — GABAPENTIN 600 MG PO TABS: 300.0000 mg | ORAL_TABLET | Freq: Two times a day (BID) | ORAL | 2 refills | Status: DC

## 2017-05-18 MED ORDER — GLIMEPIRIDE 2 MG PO TABS: 2.0000 mg | ORAL_TABLET | Freq: Two times a day (BID) | ORAL | 2 refills | Status: DC

## 2017-05-18 MED ORDER — ISOSORBIDE MONONITRATE ER 30 MG PO TB24: 30.0000 mg | ORAL_TABLET | Freq: Every day | ORAL | 0 refills | Status: DC

## 2017-05-18 MED ORDER — ATORVASTATIN CALCIUM 40 MG PO TABS: 20.0000 mg | ORAL_TABLET | Freq: Every day | ORAL | 3 refills | Status: DC

## 2017-05-18 MED ORDER — METOPROLOL TARTRATE 25 MG PO TABS: 25.0000 mg | ORAL_TABLET | Freq: Two times a day (BID) | ORAL | 3 refills | Status: DC

## 2017-05-18 MED ORDER — PANTOPRAZOLE SODIUM 40 MG PO TBEC: 40.0000 mg | DELAYED_RELEASE_TABLET | Freq: Every day | ORAL | 3 refills | Status: DC

## 2017-05-19 DIAGNOSIS — E1140 Type 2 diabetes mellitus with diabetic neuropathy, unspecified: Secondary | ICD-10-CM | POA: Diagnosis not present

## 2017-05-19 DIAGNOSIS — Z85110 Personal history of malignant carcinoid tumor of bronchus and lung: Secondary | ICD-10-CM | POA: Diagnosis not present

## 2017-05-19 DIAGNOSIS — I129 Hypertensive chronic kidney disease with stage 1 through stage 4 chronic kidney disease, or unspecified chronic kidney disease: Secondary | ICD-10-CM | POA: Diagnosis not present

## 2017-05-19 DIAGNOSIS — Z79891 Long term (current) use of opiate analgesic: Secondary | ICD-10-CM | POA: Diagnosis not present

## 2017-05-19 DIAGNOSIS — E1122 Type 2 diabetes mellitus with diabetic chronic kidney disease: Secondary | ICD-10-CM | POA: Diagnosis not present

## 2017-05-19 DIAGNOSIS — Z7952 Long term (current) use of systemic steroids: Secondary | ICD-10-CM | POA: Diagnosis not present

## 2017-05-19 DIAGNOSIS — I2510 Atherosclerotic heart disease of native coronary artery without angina pectoris: Secondary | ICD-10-CM | POA: Diagnosis not present

## 2017-05-19 DIAGNOSIS — Z7951 Long term (current) use of inhaled steroids: Secondary | ICD-10-CM | POA: Diagnosis not present

## 2017-05-19 DIAGNOSIS — Z9181 History of falling: Secondary | ICD-10-CM | POA: Diagnosis not present

## 2017-05-19 DIAGNOSIS — D631 Anemia in chronic kidney disease: Secondary | ICD-10-CM | POA: Diagnosis not present

## 2017-05-19 DIAGNOSIS — Z7984 Long term (current) use of oral hypoglycemic drugs: Secondary | ICD-10-CM | POA: Diagnosis not present

## 2017-05-19 DIAGNOSIS — Z8673 Personal history of transient ischemic attack (TIA), and cerebral infarction without residual deficits: Secondary | ICD-10-CM | POA: Diagnosis not present

## 2017-05-19 DIAGNOSIS — N183 Chronic kidney disease, stage 3 (moderate): Secondary | ICD-10-CM | POA: Diagnosis not present

## 2017-05-19 DIAGNOSIS — J441 Chronic obstructive pulmonary disease with (acute) exacerbation: Secondary | ICD-10-CM | POA: Diagnosis not present

## 2017-05-19 DIAGNOSIS — Z9981 Dependence on supplemental oxygen: Secondary | ICD-10-CM | POA: Diagnosis not present

## 2017-05-19 DIAGNOSIS — Z87891 Personal history of nicotine dependence: Secondary | ICD-10-CM | POA: Diagnosis not present

## 2017-05-20 DIAGNOSIS — J441 Chronic obstructive pulmonary disease with (acute) exacerbation: Secondary | ICD-10-CM | POA: Diagnosis not present

## 2017-05-20 DIAGNOSIS — D631 Anemia in chronic kidney disease: Secondary | ICD-10-CM | POA: Diagnosis not present

## 2017-05-20 DIAGNOSIS — I129 Hypertensive chronic kidney disease with stage 1 through stage 4 chronic kidney disease, or unspecified chronic kidney disease: Secondary | ICD-10-CM | POA: Diagnosis not present

## 2017-05-20 DIAGNOSIS — Z87891 Personal history of nicotine dependence: Secondary | ICD-10-CM | POA: Diagnosis not present

## 2017-05-20 DIAGNOSIS — Z9181 History of falling: Secondary | ICD-10-CM | POA: Diagnosis not present

## 2017-05-20 DIAGNOSIS — Z7984 Long term (current) use of oral hypoglycemic drugs: Secondary | ICD-10-CM | POA: Diagnosis not present

## 2017-05-20 DIAGNOSIS — Z7951 Long term (current) use of inhaled steroids: Secondary | ICD-10-CM | POA: Diagnosis not present

## 2017-05-20 DIAGNOSIS — Z8673 Personal history of transient ischemic attack (TIA), and cerebral infarction without residual deficits: Secondary | ICD-10-CM | POA: Diagnosis not present

## 2017-05-20 DIAGNOSIS — E1140 Type 2 diabetes mellitus with diabetic neuropathy, unspecified: Secondary | ICD-10-CM | POA: Diagnosis not present

## 2017-05-20 DIAGNOSIS — Z79891 Long term (current) use of opiate analgesic: Secondary | ICD-10-CM | POA: Diagnosis not present

## 2017-05-20 DIAGNOSIS — Z9981 Dependence on supplemental oxygen: Secondary | ICD-10-CM | POA: Diagnosis not present

## 2017-05-20 DIAGNOSIS — N183 Chronic kidney disease, stage 3 (moderate): Secondary | ICD-10-CM | POA: Diagnosis not present

## 2017-05-20 DIAGNOSIS — I2510 Atherosclerotic heart disease of native coronary artery without angina pectoris: Secondary | ICD-10-CM | POA: Diagnosis not present

## 2017-05-20 DIAGNOSIS — Z7952 Long term (current) use of systemic steroids: Secondary | ICD-10-CM | POA: Diagnosis not present

## 2017-05-20 DIAGNOSIS — E1122 Type 2 diabetes mellitus with diabetic chronic kidney disease: Secondary | ICD-10-CM | POA: Diagnosis not present

## 2017-05-20 DIAGNOSIS — Z85110 Personal history of malignant carcinoid tumor of bronchus and lung: Secondary | ICD-10-CM | POA: Diagnosis not present

## 2017-05-24 ENCOUNTER — Ambulatory Visit (INDEPENDENT_AMBULATORY_CARE_PROVIDER_SITE_OTHER): Payer: Medicare Other | Admitting: Family Medicine

## 2017-05-24 VITALS — BP 148/74 | HR 58 | Temp 97.9°F | Resp 18 | Wt 150.0 lb

## 2017-05-24 DIAGNOSIS — E1342 Other specified diabetes mellitus with diabetic polyneuropathy: Secondary | ICD-10-CM | POA: Diagnosis not present

## 2017-05-24 DIAGNOSIS — E039 Hypothyroidism, unspecified: Secondary | ICD-10-CM

## 2017-05-24 DIAGNOSIS — I10 Essential (primary) hypertension: Secondary | ICD-10-CM

## 2017-05-24 DIAGNOSIS — E782 Mixed hyperlipidemia: Secondary | ICD-10-CM | POA: Diagnosis not present

## 2017-05-24 DIAGNOSIS — G4700 Insomnia, unspecified: Secondary | ICD-10-CM

## 2017-05-24 MED ORDER — LORAZEPAM 1 MG PO TABS: 1.0000 mg | ORAL_TABLET | Freq: Two times a day (BID) | ORAL | 5 refills | Status: DC | PRN

## 2017-05-24 NOTE — Progress Notes (Signed)
[name] [name]  MRN: [id number] DOB: 20-Mar-1941  Subjective:  HPI  The patient is a 77 year old female who presents for follow up of chronic illness.  She was last seen on 04/12/17.  COPD-the patient on oxygen at night at 3 LPM.  She is not using it during the day.  She quit smoking 2.5 years ago.  Hypertension-She reports that her BP has been all over the place.   BP Readings from Last 3 Encounters:  05/24/17 (!) 148/74  05/17/17 (!) 118/56  05/10/17 (!) 111/51   Diabetes-Her last A1C was done on 12/29/16 and it was 6.6.  Patient reports her levels have been from 199-299.    Hyperlipidemia-the patient has not had her lipids checked in more than 2 years.  Patient Active Problem List   Diagnosis Date Noted  . Malnutrition of moderate degree 03/31/2017  . Acute respiratory failure with hypoxia ([place]) 03/30/2017  . AKI (acute kidney injury) ([place]) 07/15/2016  . Protein-calorie malnutrition, severe 02/08/2016  . Primary cancer of right upper lobe of lung ([place]) 11/20/2015  . Abnormal CT lung screening 10/17/2015  . Personal history of tobacco use, presenting hazards to health 10/15/2015  . Chronic vulvitis 09/26/2014  . Allergic reaction 09/26/2014  . Carotid stenosis 08/30/2014  . Cervical nerve root disorder 08/10/2014  . CAD in native artery 08/10/2014  . B12 deficiency 08/10/2014  . Back ache 08/10/2014  . Bronchitis, chronic ([place]) 08/10/2014  . Diabetes mellitus with polyneuropathy ([place]) 08/10/2014  . Can't get food down 08/10/2014  . Eczema of external ear 08/10/2014  . Accumulation of fluid in tissues 08/10/2014  . Gout 08/10/2014  . Adult hypothyroidism 08/10/2014  . Mononeuritis 08/10/2014  . Muscle ache 08/10/2014  . Disorder of peripheral nervous system 08/10/2014  . Lesion of vulva 08/10/2014  . Cervical spondylosis with radiculopathy 10/19/2013  . COPD exacerbation ([place]) 03/29/2013  . CAD (coronary artery disease) 06/22/2011  . COPD (chronic obstructive  pulmonary disease) with emphysema ([place]) 03/07/2010  . CHEST PAIN UNSPECIFIED 07/22/2009  . HLD (hyperlipidemia) 04/01/2009  . Malaise and fatigue 04/01/2009  . Hyperlipidemia 01/18/2009  . TOBACCO ABUSE 01/18/2009  . HYPERTENSION, BENIGN 01/18/2009  . CLAUDICATION 01/18/2009  . Pain in limb 01/18/2009  . CAFL (chronic airflow limitation) ([place]) 01/20/2007  . Late effects of cerebrovascular disease 01/10/2007  . Essential (primary) hypertension 12/22/2006    Past Medical History:  Diagnosis Date  . Abnormal CT lung screening 10/17/2015  . COPD (chronic obstructive pulmonary disease) ([place])   . Coronary artery disease, non-occlusive    a. cath 2006: min nonobs CAD; b. cath 12/2010: cath LAD 50%, RCA 60%; c. 08/2013: Minimal luminal irregs, right dominant system with no significant CAD, diffuse luminal irregs noted. Normal EF 55%, no AS or MS.   . Diabetes mellitus   . Hyperlipemia    Followed by Dr. Rosanna Randy  . Hypertension   . Lung cancer ([place])   . Macular degeneration    rt  . Personal history of tobacco use, presenting hazards to health 10/15/2015  . Pneumonia    hx  . Shortness of breath   . Stroke [facility])     Social History   Socioeconomic History  . Marital status: Married    Spouse name: Not on file  . Number of children: Not on file  . Years of education: Not on file  . Highest education level: Not on file  Social Needs  . Financial resource strain: Not on file  .  Food insecurity - worry: Not on file  . Food insecurity - inability: Not on file  . Transportation needs - medical: Not on file  . Transportation needs - non-medical: Not on file  Occupational History  . Occupation: Retired, Licensed conveyancer: RETIRED  Tobacco Use  . Smoking status: Former Smoker    Packs/day: 1.00    Years: 50.00    Pack years: 50.00    Types: Cigarettes    Last attempt to quit: 10/03/2015    Years since quitting: 1.6  . Smokeless tobacco: Never Used  . Tobacco comment:  smokes 3 cigs daily 05/06/15. Pt instructed to quit.  Substance and Sexual Activity  . Alcohol use: No    Alcohol/week: 0.0 oz  . Drug use: No  . Sexual activity: No  Other Topics Concern  . Not on file  Social History Narrative   Married with 4 children   Gets regular exercise    Outpatient Encounter Medications as of 05/24/2017  Medication Sig  . albuterol (PROVENTIL) (5 MG/ML) 0.5% nebulizer solution Take 0.5 mLs (2.5 mg total) by nebulization every 6 (six) hours as needed for wheezing or shortness of breath. (Patient taking differently: Take 2.5 mg by nebulization daily. )  . atorvastatin (LIPITOR) 40 MG tablet Take 0.5 tablets (20 mg total) by mouth at bedtime.  . budesonide-formoterol (SYMBICORT) 160-4.5 MCG/ACT inhaler Inhale 2 puffs into the lungs 2 (two) times daily.  . diazepam (VALIUM) 5 MG tablet Take 1 tablet (5 mg total) by mouth every 12 (twelve) hours as needed for muscle spasms.  Marland Kitchen gabapentin (NEURONTIN) 600 MG tablet Take 0.5 tablets (300 mg total) by mouth 2 (two) times daily.  Marland Kitchen glimepiride (AMARYL) 2 MG tablet Take 1 tablet (2 mg total) by mouth 2 (two) times daily.  Marland Kitchen HYDROcodone-acetaminophen (NORCO) 5-325 MG tablet Take 1 tablet by mouth every 6 (six) hours as needed for moderate pain or severe pain.  . isosorbide mononitrate (IMDUR) 30 MG 24 hr tablet Take 1 tablet (30 mg total) by mouth daily.  . magnesium oxide (MAG-OX) 400 (241.3 MG) MG tablet Take 400 mg by mouth 2 (two) times daily.   . metoprolol tartrate (LOPRESSOR) 25 MG tablet Take 1 tablet (25 mg total) by mouth 2 (two) times daily.  . mometasone (ELOCON) 0.1 % cream Apply small amount nightly to each ear  . Multiple Vitamins-Minerals (ICAPS) CAPS Take 1 capsule by mouth 2 (two) times daily.   . nitroGLYCERIN (NITROSTAT) 0.4 MG SL tablet Place 1 tablet (0.4 mg total) under the tongue every 5 (five) minutes as needed for chest pain.  Marland Kitchen ondansetron (ZOFRAN) 8 MG tablet Take 1 tablet (8 mg total) by mouth  every 8 (eight) hours as needed for nausea or vomiting.  . pantoprazole (PROTONIX) 40 MG tablet Take 1 tablet (40 mg total) by mouth daily.  . [DISCONTINUED] nystatin cream (MYCOSTATIN) Apply 1 application topically 2 (two) times daily. (Patient taking differently: Apply 1 application topically 2 (two) times daily as needed for dry skin. )   Facility-Administered Encounter Medications as of 05/24/2017  Medication  . ondansetron (ZOFRAN) 8 mg, dexamethasone (DECADRON) 10 mg in sodium chloride 0.9 % 50 mL IVPB    Allergies  Allergen Reactions  . Coconut Fatty Acids Swelling and Other (See Comments)    Throat swells   Diabetic Foot Exam - Simple   No data filed      Review of Systems  Constitutional: Negative for fever and malaise/fatigue.  HENT: Negative.   Eyes: Negative.   Respiratory: Positive for cough (especially at night). Negative for shortness of breath and wheezing.   Cardiovascular: Negative for chest pain, palpitations, orthopnea, claudication and leg swelling.  Gastrointestinal: Negative.   Skin: Negative.   Neurological: Positive for weakness (legs).  Endo/Heme/Allergies: Negative.   Psychiatric/Behavioral: Negative.     Objective:  BP (!) 148/74 (BP Location: Right Arm, Patient Position: Sitting, Cuff Size: Normal)   Pulse (!) 58   Temp 97.9 F (36.6 C) (Oral)   Resp 18   Wt 150 lb (68 kg)   SpO2 95%   BMI 25.75 kg/m   Physical Exam  Constitutional: She is oriented to person, place, and time and well-developed, well-nourished, and in no distress.  HENT:  Head: Normocephalic and atraumatic.  Eyes: Conjunctivae are normal. Pupils are equal, round, and reactive to light. No scleral icterus.  Neck: Normal range of motion. No thyromegaly present.  Cardiovascular: Normal rate, regular rhythm and normal heart sounds.  Pulmonary/Chest: Effort normal and breath sounds normal.  Abdominal: Soft.  Neurological: She is alert and oriented to person, place, and time.    Skin: Skin is warm and dry.  Psychiatric: Mood, memory, affect and judgment normal.    Assessment and Plan :  1. Essential (primary) hypertension   2. Adult hypothyroidism  - TSH  3. Diabetic polyneuropathy associated with other specified diabetes mellitus ([place])  - Hemoglobin A1c  4. Mixed hyperlipidemia  - Lipid Panel With LDL/HDL Ratio  5. Insomnia, unspecified type  - LORazepam (ATIVAN) 1 MG tablet; Take 1 tablet (1 mg total) by mouth 2 (two) times daily as needed for anxiety.  Dispense: 30 tablet; Refill: 5 6.Lung Cancer  I have done the exam and reviewed the chart and it is accurate to the best of my knowledge. Development worker, community has been used and  any errors in dictation or transcription are unintentional. Miguel Aschoff M.D. [facility]

## 2017-05-25 LAB — LIPID PANEL WITH LDL/HDL RATIO
CHOLESTEROL TOTAL: 210 mg/dL — AB (ref 100–199)
HDL: 52 mg/dL (ref >39)
LDL Calculated: 88 mg/dL (ref 0–99)
LDL/HDL RATIO: 1.7 ratio (ref 0.0–3.2)
TRIGLYCERIDES: 352 mg/dL — AB (ref 0–149)
VLDL Cholesterol Cal: 70 mg/dL — ABNORMAL HIGH (ref 5–40)

## 2017-05-25 LAB — HEMOGLOBIN A1C
Est. average glucose Bld gHb Est-mCnc: 189 mg/dL
HEMOGLOBIN A1C: 8.2 % — AB (ref 4.8–5.6)

## 2017-05-25 LAB — TSH: TSH: 2.52 u[IU]/mL (ref 0.450–4.500)

## 2017-05-26 DIAGNOSIS — D631 Anemia in chronic kidney disease: Secondary | ICD-10-CM | POA: Diagnosis not present

## 2017-05-26 DIAGNOSIS — Z7952 Long term (current) use of systemic steroids: Secondary | ICD-10-CM | POA: Diagnosis not present

## 2017-05-26 DIAGNOSIS — Z87891 Personal history of nicotine dependence: Secondary | ICD-10-CM | POA: Diagnosis not present

## 2017-05-26 DIAGNOSIS — Z7951 Long term (current) use of inhaled steroids: Secondary | ICD-10-CM | POA: Diagnosis not present

## 2017-05-26 DIAGNOSIS — Z9181 History of falling: Secondary | ICD-10-CM | POA: Diagnosis not present

## 2017-05-26 DIAGNOSIS — I2510 Atherosclerotic heart disease of native coronary artery without angina pectoris: Secondary | ICD-10-CM | POA: Diagnosis not present

## 2017-05-26 DIAGNOSIS — Z7984 Long term (current) use of oral hypoglycemic drugs: Secondary | ICD-10-CM | POA: Diagnosis not present

## 2017-05-26 DIAGNOSIS — J441 Chronic obstructive pulmonary disease with (acute) exacerbation: Secondary | ICD-10-CM | POA: Diagnosis not present

## 2017-05-26 DIAGNOSIS — I129 Hypertensive chronic kidney disease with stage 1 through stage 4 chronic kidney disease, or unspecified chronic kidney disease: Secondary | ICD-10-CM | POA: Diagnosis not present

## 2017-05-26 DIAGNOSIS — N183 Chronic kidney disease, stage 3 (moderate): Secondary | ICD-10-CM | POA: Diagnosis not present

## 2017-05-26 DIAGNOSIS — E1122 Type 2 diabetes mellitus with diabetic chronic kidney disease: Secondary | ICD-10-CM | POA: Diagnosis not present

## 2017-05-26 DIAGNOSIS — E1140 Type 2 diabetes mellitus with diabetic neuropathy, unspecified: Secondary | ICD-10-CM | POA: Diagnosis not present

## 2017-05-26 DIAGNOSIS — Z8673 Personal history of transient ischemic attack (TIA), and cerebral infarction without residual deficits: Secondary | ICD-10-CM | POA: Diagnosis not present

## 2017-05-26 DIAGNOSIS — Z9981 Dependence on supplemental oxygen: Secondary | ICD-10-CM | POA: Diagnosis not present

## 2017-05-26 DIAGNOSIS — R0602 Shortness of breath: Secondary | ICD-10-CM | POA: Diagnosis not present

## 2017-05-26 DIAGNOSIS — Z79891 Long term (current) use of opiate analgesic: Secondary | ICD-10-CM | POA: Diagnosis not present

## 2017-05-26 DIAGNOSIS — J449 Chronic obstructive pulmonary disease, unspecified: Secondary | ICD-10-CM | POA: Diagnosis not present

## 2017-05-26 DIAGNOSIS — Z85110 Personal history of malignant carcinoid tumor of bronchus and lung: Secondary | ICD-10-CM | POA: Diagnosis not present

## 2017-05-27 DIAGNOSIS — Z85110 Personal history of malignant carcinoid tumor of bronchus and lung: Secondary | ICD-10-CM | POA: Diagnosis not present

## 2017-05-27 DIAGNOSIS — Z87891 Personal history of nicotine dependence: Secondary | ICD-10-CM | POA: Diagnosis not present

## 2017-05-27 DIAGNOSIS — I2510 Atherosclerotic heart disease of native coronary artery without angina pectoris: Secondary | ICD-10-CM | POA: Diagnosis not present

## 2017-05-27 DIAGNOSIS — Z9981 Dependence on supplemental oxygen: Secondary | ICD-10-CM | POA: Diagnosis not present

## 2017-05-27 DIAGNOSIS — Z79891 Long term (current) use of opiate analgesic: Secondary | ICD-10-CM | POA: Diagnosis not present

## 2017-05-27 DIAGNOSIS — Z7984 Long term (current) use of oral hypoglycemic drugs: Secondary | ICD-10-CM | POA: Diagnosis not present

## 2017-05-27 DIAGNOSIS — Z7952 Long term (current) use of systemic steroids: Secondary | ICD-10-CM | POA: Diagnosis not present

## 2017-05-27 DIAGNOSIS — Z8673 Personal history of transient ischemic attack (TIA), and cerebral infarction without residual deficits: Secondary | ICD-10-CM | POA: Diagnosis not present

## 2017-05-27 DIAGNOSIS — E1140 Type 2 diabetes mellitus with diabetic neuropathy, unspecified: Secondary | ICD-10-CM | POA: Diagnosis not present

## 2017-05-27 DIAGNOSIS — J441 Chronic obstructive pulmonary disease with (acute) exacerbation: Secondary | ICD-10-CM | POA: Diagnosis not present

## 2017-05-27 DIAGNOSIS — E1122 Type 2 diabetes mellitus with diabetic chronic kidney disease: Secondary | ICD-10-CM | POA: Diagnosis not present

## 2017-05-27 DIAGNOSIS — D631 Anemia in chronic kidney disease: Secondary | ICD-10-CM | POA: Diagnosis not present

## 2017-05-27 DIAGNOSIS — N183 Chronic kidney disease, stage 3 (moderate): Secondary | ICD-10-CM | POA: Diagnosis not present

## 2017-05-27 DIAGNOSIS — Z7951 Long term (current) use of inhaled steroids: Secondary | ICD-10-CM | POA: Diagnosis not present

## 2017-05-27 DIAGNOSIS — I129 Hypertensive chronic kidney disease with stage 1 through stage 4 chronic kidney disease, or unspecified chronic kidney disease: Secondary | ICD-10-CM | POA: Diagnosis not present

## 2017-05-27 DIAGNOSIS — Z9181 History of falling: Secondary | ICD-10-CM | POA: Diagnosis not present

## 2017-05-28 DIAGNOSIS — Z7952 Long term (current) use of systemic steroids: Secondary | ICD-10-CM | POA: Diagnosis not present

## 2017-05-28 DIAGNOSIS — Z7984 Long term (current) use of oral hypoglycemic drugs: Secondary | ICD-10-CM | POA: Diagnosis not present

## 2017-05-28 DIAGNOSIS — Z9181 History of falling: Secondary | ICD-10-CM | POA: Diagnosis not present

## 2017-05-28 DIAGNOSIS — I129 Hypertensive chronic kidney disease with stage 1 through stage 4 chronic kidney disease, or unspecified chronic kidney disease: Secondary | ICD-10-CM | POA: Diagnosis not present

## 2017-05-28 DIAGNOSIS — Z9981 Dependence on supplemental oxygen: Secondary | ICD-10-CM | POA: Diagnosis not present

## 2017-05-28 DIAGNOSIS — J449 Chronic obstructive pulmonary disease, unspecified: Secondary | ICD-10-CM | POA: Diagnosis not present

## 2017-05-28 DIAGNOSIS — D631 Anemia in chronic kidney disease: Secondary | ICD-10-CM | POA: Diagnosis not present

## 2017-05-28 DIAGNOSIS — J441 Chronic obstructive pulmonary disease with (acute) exacerbation: Secondary | ICD-10-CM | POA: Diagnosis not present

## 2017-05-28 DIAGNOSIS — Z87891 Personal history of nicotine dependence: Secondary | ICD-10-CM | POA: Diagnosis not present

## 2017-05-28 DIAGNOSIS — Z85110 Personal history of malignant carcinoid tumor of bronchus and lung: Secondary | ICD-10-CM | POA: Diagnosis not present

## 2017-05-28 DIAGNOSIS — N183 Chronic kidney disease, stage 3 (moderate): Secondary | ICD-10-CM | POA: Diagnosis not present

## 2017-05-28 DIAGNOSIS — Z7951 Long term (current) use of inhaled steroids: Secondary | ICD-10-CM | POA: Diagnosis not present

## 2017-05-28 DIAGNOSIS — Z8673 Personal history of transient ischemic attack (TIA), and cerebral infarction without residual deficits: Secondary | ICD-10-CM | POA: Diagnosis not present

## 2017-05-28 DIAGNOSIS — Z79891 Long term (current) use of opiate analgesic: Secondary | ICD-10-CM | POA: Diagnosis not present

## 2017-05-28 DIAGNOSIS — E1122 Type 2 diabetes mellitus with diabetic chronic kidney disease: Secondary | ICD-10-CM | POA: Diagnosis not present

## 2017-05-28 DIAGNOSIS — E1140 Type 2 diabetes mellitus with diabetic neuropathy, unspecified: Secondary | ICD-10-CM | POA: Diagnosis not present

## 2017-05-28 DIAGNOSIS — I2510 Atherosclerotic heart disease of native coronary artery without angina pectoris: Secondary | ICD-10-CM | POA: Diagnosis not present

## 2017-05-31 ENCOUNTER — Ambulatory Visit (INDEPENDENT_AMBULATORY_CARE_PROVIDER_SITE_OTHER): Payer: Medicare Other

## 2017-05-31 ENCOUNTER — Ambulatory Visit: Payer: Medicare Other | Admitting: Family Medicine

## 2017-05-31 VITALS — BP 132/60 | HR 64 | Temp 97.9°F | Ht 64.0 in | Wt 148.0 lb

## 2017-05-31 DIAGNOSIS — Z0000 Encounter for general adult medical examination without abnormal findings: Secondary | ICD-10-CM | POA: Diagnosis not present

## 2017-05-31 NOTE — Progress Notes (Signed)
Subjective:   [name] [name] is a 77 y.o. female who presents for Medicare Annual (Subsequent) preventive examination.  Review of Systems:  N/A Cardiac Risk Factors include: advanced age (>25mn, >>52women);diabetes mellitus;dyslipidemia;hypertension     Objective:     Vitals: BP 132/60 (BP Location: Left Arm)   Pulse 64   Temp 97.9 F (36.6 C) (Oral)   Ht _0  (1.626 m)   Wt 148 lb (67.1 kg)   BMI 25.40 kg/m   Body mass index is 25.4 kg/m.  Advanced Directives 05/31/2017 05/17/2017 03/30/2017 03/22/2017 03/22/2017 09/22/2016 07/15/2016  Does Patient Have a Medical Advance Directive? Yes Yes No Yes No No No  Type of AParamedicof AMalvernLiving will H[place]Living will - Living will;Healthcare Power of Attorney - - -  Does patient want to make changes to medical advance directive? - No - Patient declined - - - - No - Patient declined  Copy of [place] Chart? No - copy requested No - copy requested - No - copy requested - - -  Would patient like information on creating a medical advance directive? - - No - Patient declined - - No - Patient declined No - Patient declined    Tobacco Social History   Tobacco Use  Smoking Status Former Smoker  . Packs/day: 1.00  . Years: 50.00  . Pack years: 50.00  . Types: Cigarettes  . Last attempt to quit: 10/03/2015  . Years since quitting: 1.6  Smokeless Tobacco Never Used  Tobacco Comment   smokes 3 cigs daily 05/06/15. Pt instructed to quit.     Counseling given: Not Answered Comment: smokes 3 cigs daily 05/06/15. Pt instructed to quit.   Clinical Intake:  Pre-visit preparation completed: Yes  Pain : No/denies pain Pain Score: 0-No pain     Nutritional Status: BMI 25 -29 Overweight Nutritional Risks: None Diabetes: Yes(type 2) CBG done?: No Did pt. bring in CBG monitor from home?: No  How often do you need to have someone help you when you read  instructions, pamphlets, or other written materials from your doctor or pharmacy?: 1 - Never  Interpreter Needed?: No  Information entered by :: [facility] LPN  Past Medical History:  Diagnosis Date  . Abnormal CT lung screening 10/17/2015  . COPD (chronic obstructive pulmonary disease) ([place]   . Coronary artery disease, non-occlusive    a. cath 2006: min nonobs CAD; b. cath 12/2010: cath LAD 50%, RCA 60%; c. 08/2013: Minimal luminal irregs, right dominant system with no significant CAD, diffuse luminal irregs noted. Normal EF 55%, no AS or MS.   . Diabetes mellitus   . Hyperlipemia    Followed by Dr. GRosanna Randy . Hypertension   . Lung cancer ([place]   . Macular degeneration    rt  . Personal history of tobacco use, presenting hazards to health 10/15/2015  . Pneumonia    hx  . Shortness of breath   . Stroke ([facility]    Past Surgical History:  Procedure Laterality Date  . ABDOMINAL HYSTERECTOMY    . ANTERIOR CERVICAL DECOMP/DISCECTOMY FUSION N/A 10/19/2013   Procedure: CERVICAL FIVE-SIX ANTERIOR CERVICAL DECOMPRESSION WITH FUSION INTERBODY PROSTHESIS PLATING AND PEEK CAGE;  Surgeon: JOphelia Charter MD;  [place];  Service: Neurosurgery;  Laterality: N/A;  . BACK SURGERY  80's  . BREAST CYST EXCISION Left    left negative   . CARDIAC CATHETERIZATION  05/2004  . CATARACT  EXTRACTION Left   . CHOLECYSTECTOMY    . ENDARTERECTOMY Left 10/24/2014   Procedure: ENDARTERECTOMY CAROTID;  Surgeon: Algernon Huxley, MD;  Location: [facility] ORS;  Service: Vascular;  Laterality: Left;  . ENDOBRONCHIAL ULTRASOUND N/A 11/14/2015   Procedure: ENDOBRONCHIAL ULTRASOUND;  Surgeon: [name], MD;  Location: [facility] ORS;  Service: Cardiopulmonary;  Laterality: N/A;  . PERIPHERAL VASCULAR CATHETERIZATION N/A 12/04/2015   Procedure: Glori Luis Cath Insertion;  Surgeon: Algernon Huxley, MD;  Location: [place] CV LAB;  Service: Cardiovascular;  Laterality: N/A;  . PORTA CATH REMOVAL N/A 05/17/2017   Procedure:  PORTA CATH REMOVAL;  Surgeon: Algernon Huxley, MD;  Location: [place] CV LAB;  Service: Cardiovascular;  Laterality: N/A;  . TONSILLECTOMY AND ADENOIDECTOMY    . VESICOVAGINAL FISTULA CLOSURE W/ TAH     Family History  Problem Relation Age of Onset  . Stroke Mother        Massive  . Heart attack Father        Massive  . Heart attack Brother   . Heart attack Brother   . Lung cancer Maternal Grandfather   . Heart attack Paternal Grandmother        MI   Social History   Socioeconomic History  . Marital status: Married    Spouse name: None  . Number of children: 4  . Years of education: None  . Highest education level: 11th grade  Social Needs  . Financial resource strain: None  . Food insecurity - worry: Never true  . Food insecurity - inability: Never true  . Transportation needs - medical: No  . Transportation needs - non-medical: No  Occupational History  . Occupation: Retired, Licensed conveyancer: RETIRED  Tobacco Use  . Smoking status: Former Smoker    Packs/day: 1.00    Years: 50.00    Pack years: 50.00    Types: Cigarettes    Last attempt to quit: 10/03/2015    Years since quitting: 1.6  . Smokeless tobacco: Never Used  . Tobacco comment: smokes 3 cigs daily 05/06/15. Pt instructed to quit.  Substance and Sexual Activity  . Alcohol use: No    Alcohol/week: 0.0 oz  . Drug use: No  . Sexual activity: No  Other Topics Concern  . None  Social History Narrative   Married with 4 children   Gets regular exercise    Outpatient Encounter Medications as of 05/31/2017  Medication Sig  . albuterol (PROVENTIL) (5 MG/ML) 0.5% nebulizer solution Take 0.5 mLs (2.5 mg total) by nebulization every 6 (six) hours as needed for wheezing or shortness of breath. (Patient taking differently: Take 2.5 mg by nebulization daily. )  . atorvastatin (LIPITOR) 40 MG tablet Take 0.5 tablets (20 mg total) by mouth at bedtime.  . budesonide-formoterol (SYMBICORT) 160-4.5  MCG/ACT inhaler Inhale 2 puffs into the lungs 2 (two) times daily.  Marland Kitchen gabapentin (NEURONTIN) 600 MG tablet Take 0.5 tablets (300 mg total) by mouth 2 (two) times daily.  Marland Kitchen glimepiride (AMARYL) 2 MG tablet Take 1 tablet (2 mg total) by mouth 2 (two) times daily.  Marland Kitchen HYDROcodone-acetaminophen (NORCO) 5-325 MG tablet Take 1 tablet by mouth every 6 (six) hours as needed for moderate pain or severe pain.  . isosorbide mononitrate (IMDUR) 30 MG 24 hr tablet Take 1 tablet (30 mg total) by mouth daily.  Marland Kitchen LORazepam (ATIVAN) 1 MG tablet Take 1 tablet (1 mg total) by mouth 2 (two) times daily as needed for anxiety.  Marland Kitchen  magnesium oxide (MAG-OX) 400 (241.3 MG) MG tablet Take 400 mg by mouth 2 (two) times daily.   . metoprolol tartrate (LOPRESSOR) 25 MG tablet Take 1 tablet (25 mg total) by mouth 2 (two) times daily.  . mometasone (ELOCON) 0.1 % cream Apply small amount nightly to each ear  . Multiple Vitamins-Minerals (ICAPS) CAPS Take 1 capsule by mouth 2 (two) times daily.   . nitroGLYCERIN (NITROSTAT) 0.4 MG SL tablet Place 1 tablet (0.4 mg total) under the tongue every 5 (five) minutes as needed for chest pain.  Marland Kitchen ondansetron (ZOFRAN) 8 MG tablet Take 1 tablet (8 mg total) by mouth every 8 (eight) hours as needed for nausea or vomiting.  . pantoprazole (PROTONIX) 40 MG tablet Take 1 tablet (40 mg total) by mouth daily. (Patient not taking: Reported on 05/31/2017)   Facility-Administered Encounter Medications as of 05/31/2017  Medication  . ondansetron (ZOFRAN) 8 mg, dexamethasone (DECADRON) 10 mg in sodium chloride 0.9 % 50 mL IVPB    Activities of Daily Living In your present state of health, do you have any difficulty performing the following activities: 05/31/2017 03/30/2017  Hearing? Y Y  Comment Hearing loss in left ear. Left ear  Vision? N N  Difficulty concentrating or making decisions? N N  Walking or climbing stairs? Y Y  Comment Due to SOB and pain. -  Dressing or bathing? N N  Doing  errands, shopping? Y Y  Comment Does not drive.  Spouse assists with errands  Preparing Food and eating ? N -  Using the Toilet? N -  In the past six months, have you accidently leaked urine? N -  Do you have problems with loss of bowel control? N -  Managing your Medications? N -  Managing your Finances? N -  Housekeeping or managing your Housekeeping? N -  Some recent data might be hidden    Patient Care Team: Jerrol Banana., MD as PCP - General (Unknown Physician Specialty) Rockey Situ, Kathlene November, MD as Consulting Physician (Cardiology) Dew, Erskine Squibb, MD as Consulting Physician (Vascular Surgery) Pa, [place] as Consulting Physician (Optometry) [name], Kathlene November, MD as Consulting Physician (Oncology) [name], MD as Referring Physician (Radiation Oncology) Murlean Iba, MD as Consulting Physician (Internal Medicine)    Assessment:   This is a routine wellness examination for [place].  Exercise Activities and Dietary recommendations Current Exercise Habits: The patient does not participate in regular exercise at present, Exercise limited by: orthopedic condition(s)  Goals    . Prevent falls     Recommend several different ways to avoid falls and how to prep home to prevent falls.       Fall Risk Fall Risk  05/31/2017 12/29/2016 07/22/2016 06/18/2016 01/01/2016  Falls in the past year? Yes Yes Yes No No  Number falls in past yr: 2 or more 2 or more 1 - -  Injury with Fall? No No Yes - -  Comment - - shouklder pain/no fracture - -  Risk Factor Category  High Fall Risk - - - -  Risk for fall due to : Impaired balance/gait - Impaired mobility - -  Risk for fall due to: Comment - - uses walker - -  Follow up Falls prevention discussed Falls evaluation completed;Falls prevention discussed Education provided - -   Is the patient's home free of loose throw rugs in walkways, pet beds, electrical cords, etc?   yes      Grab bars in the bathroom? no  Handrails on the stairs?   yes      Adequate lighting?   yes  Timed Get Up and Go performed: N/A  Depression Screen PHQ 2/9 Scores 05/31/2017 07/22/2016 06/18/2016 02/07/2015  PHQ - 2 Score 0 0 0 1  PHQ- 9 Score - - - 7     Cognitive Function- Pt declined screening today and states she had this completed by the [facility] nurse that came to home 04/2017.        Immunization History  Administered Date(s) Administered  . Influenza Split 01/17/2010, 01/05/2012, 02/29/2012  . Influenza Whole 12/05/2009, 12/10/2010  . Influenza, High Dose Seasonal PF 02/14/2014, 01/01/2015, 12/29/2016  . Influenza,inj,Quad PF,6+ Mos 01/04/2013, 02/27/2013, 03/19/2016  . Influenza-Unspecified 02/04/2014  . Pneumococcal Conjugate-13 02/29/2012  . Pneumococcal Polysaccharide-23 01/05/2008, 01/05/2012  . Tdap 01/05/2012    Qualifies for Shingles Vaccine? Pt declines today.   Screening Tests Health Maintenance  Topic Date Due  . COLONOSCOPY  03/11/1992  . HEMOGLOBIN A1C  11/21/2017  . URINE MICROALBUMIN  12/29/2017  . OPHTHALMOLOGY EXAM  01/05/2018  . FOOT EXAM  05/24/2018  . TETANUS/TDAP  01/04/2022  . INFLUENZA VACCINE  Completed  . DEXA SCAN  Completed  . PNA vac Low Risk Adult  Completed    Cancer Screenings: Lung: Low Dose CT Chest recommended if Age 33-80 years, 30 pack-year currently smoking OR have quit w/in 15years. Patient does not qualify, completed 03/2017. Breast:  Up to date on Mammogram? Yes   Up to date of Bone Density/Dexa? Yes Colorectal: Pt declines today.   Additional Screenings:  Hepatitis C Screening: N/A     Plan:  I have personally reviewed and addressed the Medicare Annual Wellness questionnaire and have noted the following in the patient's chart:  A. Medical and social history B. Use of alcohol, tobacco or illicit drugs  C. Current medications and supplements D. Functional ability and status E.  Nutritional status F.  Physical activity G. Advance directives H. List  of other physicians I.  Hospitalizations, surgeries, and ER visits in previous 12 months J.  [place] such as hearing and vision if needed, cognitive and depression L. Referrals and appointments - none  In addition, I have reviewed and discussed with patient certain preventive protocols, quality metrics, and best practice recommendations. A written personalized care plan for preventive services as well as general preventive health recommendations were provided to patient.  See attached scanned questionnaire for additional information.   Signed,  Fabio Neighbors, LPN Nurse Health Advisor   Nurse Recommendations: Pt states she never received the cologuard kit. Pt declines reordering this or getting a referral due to her age.

## 2017-05-31 NOTE — Patient Instructions (Signed)
[name] [name] , Thank you for taking time to come for your Medicare Wellness Visit. I appreciate your ongoing commitment to your health goals. Please review the following plan we discussed and let me know if I can assist you in the future.   Screening recommendations/referrals: Colonoscopy: Pt declines today.  Mammogram: Up to date Bone Density: Up to date Recommended yearly ophthalmology/optometry visit for glaucoma screening and checkup Recommended yearly dental visit for hygiene and checkup  Vaccinations: Influenza vaccine: Up to date Pneumococcal vaccine: Up to date Tdap vaccine: Up to date Shingles vaccine: Pt declines today.     Advanced directives: Please bring a copy of your POA (Power of Attorney) and/or Living Will to your next appointment.   Conditions/risks identified: Fall risk prevention.   Next appointment: 09/21/17 with Dr Rosanna Randy.   Preventive Care 76 Years and Older, Female Preventive care refers to lifestyle choices and visits with your health care provider that can promote health and wellness. What does preventive care include?  A yearly physical exam. This is also called an annual well check.  Dental exams once or twice a year.  Routine eye exams. Ask your health care provider how often you should have your eyes checked.  Personal lifestyle choices, including:  Daily care of your teeth and gums.  Regular physical activity.  Eating a healthy diet.  Avoiding tobacco and drug use.  Limiting alcohol use.  Practicing safe sex.  Taking low-dose aspirin every day.  Taking vitamin and mineral supplements as recommended by your health care provider. What happens during an annual well check? The services and screenings done by your health care provider during your annual well check will depend on your age, overall health, lifestyle risk factors, and family history of disease. Counseling  Your health care provider may ask you questions about your:  Alcohol  use.  Tobacco use.  Drug use.  Emotional well-being.  Home and relationship well-being.  Sexual activity.  Eating habits.  History of falls.  Memory and ability to understand (cognition).  Work and work Statistician.  Reproductive health. Screening  You may have the following tests or measurements:  Height, weight, and BMI.  Blood pressure.  Lipid and cholesterol levels. These may be checked every 5 years, or more frequently if you are over 107 years old.  Skin check.  Lung cancer screening. You may have this screening every year starting at age 12 if you have a 30-pack-year history of smoking and currently smoke or have quit within the past 15 years.  Fecal occult blood test (FOBT) of the stool. You may have this test every year starting at age 63.  Flexible sigmoidoscopy or colonoscopy. You may have a sigmoidoscopy every 5 years or a colonoscopy every 10 years starting at age 31.  Hepatitis C blood test.  Hepatitis B blood test.  Sexually transmitted disease (STD) testing.  Diabetes screening. This is done by checking your blood sugar (glucose) after you have not eaten for a while (fasting). You may have this done every 1-3 years.  Bone density scan. This is done to screen for osteoporosis. You may have this done starting at age 17.  Mammogram. This may be done every 1-2 years. Talk to your health care provider about how often you should have regular mammograms. Talk with your health care provider about your test results, treatment options, and if necessary, the need for more tests. Vaccines  Your health care provider may recommend certain vaccines, such as:  Influenza vaccine. This  is recommended every year.  Tetanus, diphtheria, and acellular pertussis (Tdap, Td) vaccine. You may need a Td booster every 10 years.  Zoster vaccine. You may need this after age 32.  Pneumococcal 13-valent conjugate (PCV13) vaccine. One dose is recommended after age  58.  Pneumococcal polysaccharide (PPSV23) vaccine. One dose is recommended after age 15. Talk to your health care provider about which screenings and vaccines you need and how often you need them. This information is not intended to replace advice given to you by your health care provider. Make sure you discuss any questions you have with your health care provider. Document Released: 04/19/2015 Document Revised: 12/11/2015 Document Reviewed: 01/22/2015 Elsevier Interactive Patient Education  2017 [place] Prevention in the Home Falls can cause injuries. They can happen to people of all ages. There are many things you can do to make your home safe and to help prevent falls. What can I do on the outside of my home?  Regularly fix the edges of walkways and driveways and fix any cracks.  Remove anything that might make you trip as you walk through a door, such as a raised step or threshold.  Trim any bushes or trees on the path to your home.  Use bright outdoor lighting.  Clear any walking paths of anything that might make someone trip, such as rocks or tools.  Regularly check to see if handrails are loose or broken. Make sure that both sides of any steps have handrails.  Any raised decks and porches should have guardrails on the edges.  Have any leaves, snow, or ice cleared regularly.  Use sand or salt on walking paths during winter.  Clean up any spills in your garage right away. This includes oil or grease spills. What can I do in the bathroom?  Use night lights.  Install grab bars by the toilet and in the tub and shower. Do not use towel bars as grab bars.  Use non-skid mats or decals in the tub or shower.  If you need to sit down in the shower, use a plastic, non-slip stool.  Keep the floor dry. Clean up any water that spills on the floor as soon as it happens.  Remove soap buildup in the tub or shower regularly.  Attach bath mats securely with double-sided  non-slip rug tape.  Do not have throw rugs and other things on the floor that can make you trip. What can I do in the bedroom?  Use night lights.  Make sure that you have a light by your bed that is easy to reach.  Do not use any sheets or blankets that are too big for your bed. They should not hang down onto the floor.  Have a firm chair that has side arms. You can use this for support while you get dressed.  Do not have throw rugs and other things on the floor that can make you trip. What can I do in the kitchen?  Clean up any spills right away.  Avoid walking on wet floors.  Keep items that you use a lot in easy-to-reach places.  If you need to reach something above you, use a strong step stool that has a grab bar.  Keep electrical cords out of the way.  Do not use floor polish or wax that makes floors slippery. If you must use wax, use non-skid floor wax.  Do not have throw rugs and other things on the floor that can make you  trip. What can I do with my stairs?  Do not leave any items on the stairs.  Make sure that there are handrails on both sides of the stairs and use them. Fix handrails that are broken or loose. Make sure that handrails are as long as the stairways.  Check any carpeting to make sure that it is firmly attached to the stairs. Fix any carpet that is loose or worn.  Avoid having throw rugs at the top or bottom of the stairs. If you do have throw rugs, attach them to the floor with carpet tape.  Make sure that you have a light switch at the top of the stairs and the bottom of the stairs. If you do not have them, ask someone to add them for you. What else can I do to help prevent falls?  Wear shoes that:  Do not have high heels.  Have rubber bottoms.  Are comfortable and fit you well.  Are closed at the toe. Do not wear sandals.  If you use a stepladder:  Make sure that it is fully opened. Do not climb a closed stepladder.  Make sure that both  sides of the stepladder are locked into place.  Ask someone to hold it for you, if possible.  Clearly mark and make sure that you can see:  Any grab bars or handrails.  First and last steps.  Where the edge of each step is.  Use tools that help you move around (mobility aids) if they are needed. These include:  Canes.  Walkers.  Scooters.  Crutches.  Turn on the lights when you go into a dark area. Replace any light bulbs as soon as they burn out.  Set up your furniture so you have a clear path. Avoid moving your furniture around.  If any of your floors are uneven, fix them.  If there are any pets around you, be aware of where they are.  Review your medicines with your doctor. Some medicines can make you feel dizzy. This can increase your chance of falling. Ask your doctor what other things that you can do to help prevent falls. This information is not intended to replace advice given to you by your health care provider. Make sure you discuss any questions you have with your health care provider. Document Released: 01/17/2009 Document Revised: 08/29/2015 Document Reviewed: 04/27/2014 Elsevier Interactive Patient Education  2017 [employer].

## 2017-06-02 DIAGNOSIS — E1140 Type 2 diabetes mellitus with diabetic neuropathy, unspecified: Secondary | ICD-10-CM | POA: Diagnosis not present

## 2017-06-02 DIAGNOSIS — Z8673 Personal history of transient ischemic attack (TIA), and cerebral infarction without residual deficits: Secondary | ICD-10-CM | POA: Diagnosis not present

## 2017-06-02 DIAGNOSIS — Z7952 Long term (current) use of systemic steroids: Secondary | ICD-10-CM | POA: Diagnosis not present

## 2017-06-02 DIAGNOSIS — E1122 Type 2 diabetes mellitus with diabetic chronic kidney disease: Secondary | ICD-10-CM | POA: Diagnosis not present

## 2017-06-02 DIAGNOSIS — Z7951 Long term (current) use of inhaled steroids: Secondary | ICD-10-CM | POA: Diagnosis not present

## 2017-06-02 DIAGNOSIS — Z85110 Personal history of malignant carcinoid tumor of bronchus and lung: Secondary | ICD-10-CM | POA: Diagnosis not present

## 2017-06-02 DIAGNOSIS — Z7984 Long term (current) use of oral hypoglycemic drugs: Secondary | ICD-10-CM | POA: Diagnosis not present

## 2017-06-02 DIAGNOSIS — I2510 Atherosclerotic heart disease of native coronary artery without angina pectoris: Secondary | ICD-10-CM | POA: Diagnosis not present

## 2017-06-02 DIAGNOSIS — N183 Chronic kidney disease, stage 3 (moderate): Secondary | ICD-10-CM | POA: Diagnosis not present

## 2017-06-02 DIAGNOSIS — D631 Anemia in chronic kidney disease: Secondary | ICD-10-CM | POA: Diagnosis not present

## 2017-06-02 DIAGNOSIS — J441 Chronic obstructive pulmonary disease with (acute) exacerbation: Secondary | ICD-10-CM | POA: Diagnosis not present

## 2017-06-02 DIAGNOSIS — Z9981 Dependence on supplemental oxygen: Secondary | ICD-10-CM | POA: Diagnosis not present

## 2017-06-02 DIAGNOSIS — Z9181 History of falling: Secondary | ICD-10-CM | POA: Diagnosis not present

## 2017-06-02 DIAGNOSIS — I129 Hypertensive chronic kidney disease with stage 1 through stage 4 chronic kidney disease, or unspecified chronic kidney disease: Secondary | ICD-10-CM | POA: Diagnosis not present

## 2017-06-02 DIAGNOSIS — Z87891 Personal history of nicotine dependence: Secondary | ICD-10-CM | POA: Diagnosis not present

## 2017-06-02 DIAGNOSIS — Z79891 Long term (current) use of opiate analgesic: Secondary | ICD-10-CM | POA: Diagnosis not present

## 2017-06-03 DIAGNOSIS — I2510 Atherosclerotic heart disease of native coronary artery without angina pectoris: Secondary | ICD-10-CM | POA: Diagnosis not present

## 2017-06-03 DIAGNOSIS — Z7952 Long term (current) use of systemic steroids: Secondary | ICD-10-CM | POA: Diagnosis not present

## 2017-06-03 DIAGNOSIS — D631 Anemia in chronic kidney disease: Secondary | ICD-10-CM | POA: Diagnosis not present

## 2017-06-03 DIAGNOSIS — Z85110 Personal history of malignant carcinoid tumor of bronchus and lung: Secondary | ICD-10-CM | POA: Diagnosis not present

## 2017-06-03 DIAGNOSIS — Z9981 Dependence on supplemental oxygen: Secondary | ICD-10-CM | POA: Diagnosis not present

## 2017-06-03 DIAGNOSIS — Z8673 Personal history of transient ischemic attack (TIA), and cerebral infarction without residual deficits: Secondary | ICD-10-CM | POA: Diagnosis not present

## 2017-06-03 DIAGNOSIS — E1122 Type 2 diabetes mellitus with diabetic chronic kidney disease: Secondary | ICD-10-CM | POA: Diagnosis not present

## 2017-06-03 DIAGNOSIS — Z9181 History of falling: Secondary | ICD-10-CM | POA: Diagnosis not present

## 2017-06-03 DIAGNOSIS — Z79891 Long term (current) use of opiate analgesic: Secondary | ICD-10-CM | POA: Diagnosis not present

## 2017-06-03 DIAGNOSIS — I129 Hypertensive chronic kidney disease with stage 1 through stage 4 chronic kidney disease, or unspecified chronic kidney disease: Secondary | ICD-10-CM | POA: Diagnosis not present

## 2017-06-03 DIAGNOSIS — J441 Chronic obstructive pulmonary disease with (acute) exacerbation: Secondary | ICD-10-CM | POA: Diagnosis not present

## 2017-06-03 DIAGNOSIS — E1140 Type 2 diabetes mellitus with diabetic neuropathy, unspecified: Secondary | ICD-10-CM | POA: Diagnosis not present

## 2017-06-03 DIAGNOSIS — Z87891 Personal history of nicotine dependence: Secondary | ICD-10-CM | POA: Diagnosis not present

## 2017-06-03 DIAGNOSIS — N183 Chronic kidney disease, stage 3 (moderate): Secondary | ICD-10-CM | POA: Diagnosis not present

## 2017-06-03 DIAGNOSIS — Z7951 Long term (current) use of inhaled steroids: Secondary | ICD-10-CM | POA: Diagnosis not present

## 2017-06-03 DIAGNOSIS — Z7984 Long term (current) use of oral hypoglycemic drugs: Secondary | ICD-10-CM | POA: Diagnosis not present

## 2017-06-09 ENCOUNTER — Other Ambulatory Visit: Payer: Self-pay

## 2017-06-09 ENCOUNTER — Ambulatory Visit
Admission: RE | Admit: 2017-06-09 | Discharge: 2017-06-09 | Disposition: A | Discharge status: Home / Self Care | Payer: Medicare Other | Source: Ambulatory Visit | Attending: Radiation Oncology | Admitting: Radiation Oncology

## 2017-06-09 ENCOUNTER — Encounter: Payer: Self-pay | Admitting: Radiation Oncology

## 2017-06-09 VITALS — BP 134/64 | HR 76 | Temp 98.6°F | Resp 20

## 2017-06-09 DIAGNOSIS — J449 Chronic obstructive pulmonary disease, unspecified: Secondary | ICD-10-CM | POA: Insufficient documentation

## 2017-06-09 DIAGNOSIS — Z9181 History of falling: Secondary | ICD-10-CM | POA: Insufficient documentation

## 2017-06-09 DIAGNOSIS — Z9981 Dependence on supplemental oxygen: Secondary | ICD-10-CM | POA: Diagnosis not present

## 2017-06-09 DIAGNOSIS — Z87891 Personal history of nicotine dependence: Secondary | ICD-10-CM | POA: Insufficient documentation

## 2017-06-09 DIAGNOSIS — Z85118 Personal history of other malignant neoplasm of bronchus and lung: Secondary | ICD-10-CM | POA: Insufficient documentation

## 2017-06-09 DIAGNOSIS — Z9221 Personal history of antineoplastic chemotherapy: Secondary | ICD-10-CM | POA: Insufficient documentation

## 2017-06-09 DIAGNOSIS — C3411 Malignant neoplasm of upper lobe, right bronchus or lung: Secondary | ICD-10-CM

## 2017-06-09 DIAGNOSIS — Z923 Personal history of irradiation: Secondary | ICD-10-CM | POA: Insufficient documentation

## 2017-06-09 NOTE — Progress Notes (Signed)
Radiation Oncology Follow up Note  Name: [name] [name]   Date:   06/09/2017 MRN:  [id number] DOB: 14-Jun-1940    This 77 y.o. female presents to the clinic today for 1 year follow-up status post concurrent chemoradiation and PCI for limited stage small cell lung cancer. Marland Kitchen  REFERRING PROVIDER: Jerrol Banana.,*  HPI: Patient is a 77 year old female now out 18 months having completed concurrent chemoradiation therapy for small cell lung cancer in 1 year follow-up for PCI. She is seen today in routine follow-up and is doing well. She specifically denies hemoptysis or chest tightness does have a chronic nonproductive cough. She is in a wheelchair today he is using a cane for ambulatory assistance and is having some issues with falling. She also continues on nasal oxygen and does have significant COPD. Recently had a CT scan back in December showing nodule in the right upper lobe sparing stable no findings for progressive or metastatic disease. CT scan of the head showed no acute intracranial abnormalities.  COMPLICATIONS OF TREATMENT: none  FOLLOW UP COMPLIANCE: keeps appointments   PHYSICAL EXAM:  BP 134/64   Pulse 76   Temp 98.6 F (37 C)   Resp 20  Well-developed well-nourished patient in NAD. HEENT reveals PERLA, EOMI, discs not visualized.  Oral cavity is clear. No oral mucosal lesions are identified. Neck is clear without evidence of cervical or supraclavicular adenopathy. Lungs are clear to A&P. Cardiac examination is essentially unremarkable with regular rate and rhythm without murmur rub or thrill. Abdomen is benign with no organomegaly or masses noted. Motor sensory and DTR levels are equal and symmetric in the upper and lower extremities. Cranial nerves II through XII are grossly intact. Proprioception is intact. No peripheral adenopathy or edema is identified. No motor or sensory levels are noted. Crude visual fields are within normal range.  RADIOLOGY RESULTS: CT scan of chest  and head both reviewed and compatible above-stated findings  PLAN: Present time she continues to do well with no evidence of disease. I'm please were overall progress. She continues follow-up care with medical oncology. I have asked to see her back in 1 year for follow-up. Patient is to call with any concerns.  I would like to take this opportunity to thank you for allowing me to participate in the care of your patient.[name], MD

## 2017-06-23 DIAGNOSIS — R0602 Shortness of breath: Secondary | ICD-10-CM | POA: Diagnosis not present

## 2017-06-23 DIAGNOSIS — J449 Chronic obstructive pulmonary disease, unspecified: Secondary | ICD-10-CM | POA: Diagnosis not present

## 2017-06-25 DIAGNOSIS — J449 Chronic obstructive pulmonary disease, unspecified: Secondary | ICD-10-CM | POA: Diagnosis not present

## 2017-07-01 ENCOUNTER — Telehealth: Payer: Self-pay | Admitting: Family Medicine

## 2017-07-01 ENCOUNTER — Encounter: Payer: Self-pay | Admitting: Family Medicine

## 2017-07-01 DIAGNOSIS — N183 Chronic kidney disease, stage 3 (moderate): Secondary | ICD-10-CM | POA: Diagnosis not present

## 2017-07-01 DIAGNOSIS — E1129 Type 2 diabetes mellitus with other diabetic kidney complication: Secondary | ICD-10-CM | POA: Diagnosis not present

## 2017-07-01 NOTE — Telephone Encounter (Signed)
Patient has Pantoprazole on her medicine list and they thought the hospital had stopped it.  After review of the hospital discharge medicine list it appears she should still be on it.  Patient advised

## 2017-07-01 NOTE — Telephone Encounter (Signed)
Patient's husband has questions regarding which medications [name] [name] is to take.  The hospital has d/c some of her meds and they arent sure which ones.

## 2017-07-06 ENCOUNTER — Other Ambulatory Visit: Payer: Self-pay | Admitting: Family Medicine

## 2017-07-06 DIAGNOSIS — H353212 Exudative age-related macular degeneration, right eye, with inactive choroidal neovascularization: Secondary | ICD-10-CM | POA: Diagnosis not present

## 2017-07-06 LAB — HM DIABETES EYE EXAM

## 2017-07-13 NOTE — Telephone Encounter (Signed)
Received letter stating they received an empty collection kit and would send out a new one. 01/07/17

## 2017-07-20 ENCOUNTER — Encounter: Payer: Self-pay | Admitting: Family Medicine

## 2017-07-21 ENCOUNTER — Encounter: Payer: Self-pay | Admitting: Family Medicine

## 2017-07-21 ENCOUNTER — Ambulatory Visit (INDEPENDENT_AMBULATORY_CARE_PROVIDER_SITE_OTHER): Payer: Medicare Other | Admitting: Family Medicine

## 2017-07-21 VITALS — BP 120/66 | HR 69 | Temp 98.0°F | Resp 16 | Wt 147.0 lb

## 2017-07-21 DIAGNOSIS — J0140 Acute pansinusitis, unspecified: Secondary | ICD-10-CM

## 2017-07-21 DIAGNOSIS — H109 Unspecified conjunctivitis: Secondary | ICD-10-CM

## 2017-07-21 MED ORDER — AMOXICILLIN-POT CLAVULANATE 875-125 MG PO TABS: 1.0000 | ORAL_TABLET | Freq: Two times a day (BID) | ORAL | 0 refills | Status: DC

## 2017-07-21 MED ORDER — FLUTICASONE PROPIONATE 50 MCG/ACT NA SUSP: 2.0000 | Freq: Every day | NASAL | 6 refills | Status: DC

## 2017-07-21 MED ORDER — POLYMYXIN B-TRIMETHOPRIM 10000-0.1 UNIT/ML-% OP SOLN: 1.0000 [drp] | Freq: Four times a day (QID) | OPHTHALMIC | 0 refills | Status: AC

## 2017-07-21 NOTE — Patient Instructions (Signed)

## 2017-07-21 NOTE — Progress Notes (Signed)
Patient: [name] [name] Female    DOB: 30-Jan-1941   77 y.o.   MRN: [id number] Visit Date: 07/21/2017  Today's Provider: Lavon Paganini, MD   I, Martha Clan, CMA, am acting as scribe for Lavon Paganini, MD.  Chief Complaint  Patient presents with  . Sinus Problem   Subjective:    Sinus Problem  This is a new problem. Episode onset: more than 1 week. The problem is unchanged. Maximum temperature: 101-102 at nighttime. She is experiencing no pain. Associated symptoms include chills, congestion, coughing (dry), diaphoresis, ear pain (left), a hoarse voice, shortness of breath (baseline), sinus pressure, sneezing and a sore throat. Pertinent negatives include no headaches, neck pain or swollen glands. (Pt is also c/o eye redness, itching and discharge that caused her eyes to be matted shut this morning.) Treatments tried: cough syrup. The treatment provided mild relief.   She also noticed that her right eye became red about 2 days ago.  It has had excessive crusting and drainage that is worse in the mornings, but does continue throughout the day.  She denies any visual changes, eye pain.    Allergies  Allergen Reactions  . Coconut Fatty Acids Swelling and Other (See Comments)    Throat swells     Current Outpatient Medications:  .  albuterol (PROVENTIL) (5 MG/ML) 0.5% nebulizer solution, Take 0.5 mLs (2.5 mg total) by nebulization every 6 (six) hours as needed for wheezing or shortness of breath. (Patient taking differently: Take 2.5 mg by nebulization daily. ), Disp: 100 vial, Rfl: 0 .  atorvastatin (LIPITOR) 40 MG tablet, Take 0.5 tablets (20 mg total) by mouth at bedtime., Disp: 90 tablet, Rfl: 3 .  budesonide-formoterol (SYMBICORT) 160-4.5 MCG/ACT inhaler, Inhale 2 puffs into the lungs 2 (two) times daily., Disp: 1 Inhaler, Rfl: 0 .  gabapentin (NEURONTIN) 600 MG tablet, Take 0.5 tablets (300 mg total) by mouth 2 (two) times daily., Disp: 60 tablet, Rfl: 2 .  glimepiride  (AMARYL) 2 MG tablet, Take 1 tablet (2 mg total) by mouth 2 (two) times daily., Disp: 60 tablet, Rfl: 2 .  isosorbide mononitrate (IMDUR) 30 MG 24 hr tablet, TAKE 1 TABLET BY MOUTH  DAILY, Disp: 90 tablet, Rfl: 3 .  LORazepam (ATIVAN) 1 MG tablet, Take 1 tablet (1 mg total) by mouth 2 (two) times daily as needed for anxiety., Disp: 30 tablet, Rfl: 5 .  magnesium oxide (MAG-OX) 400 (241.3 MG) MG tablet, Take 400 mg by mouth 2 (two) times daily. , Disp: , Rfl:  .  metoprolol tartrate (LOPRESSOR) 25 MG tablet, Take 1 tablet (25 mg total) by mouth 2 (two) times daily., Disp: 180 tablet, Rfl: 3 .  mometasone (ELOCON) 0.1 % cream, Apply small amount nightly to each ear, Disp: 45 g, Rfl: 0 .  Multiple Vitamins-Minerals (ICAPS) CAPS, Take 1 capsule by mouth 2 (two) times daily. , Disp: , Rfl:  .  nitroGLYCERIN (NITROSTAT) 0.4 MG SL tablet, Place 1 tablet (0.4 mg total) under the tongue every 5 (five) minutes as needed for chest pain., Disp: 25 tablet, Rfl: 3 .  ondansetron (ZOFRAN) 8 MG tablet, Take 1 tablet (8 mg total) by mouth every 8 (eight) hours as needed for nausea or vomiting., Disp: 30 tablet, Rfl: 1 .  pantoprazole (PROTONIX) 40 MG tablet, Take 1 tablet (40 mg total) by mouth daily., Disp: 90 tablet, Rfl: 3 .  HYDROcodone-acetaminophen (NORCO) 5-325 MG tablet, Take 1 tablet by mouth every 6 (six) hours as needed  for moderate pain or severe pain. (Patient not taking: Reported on 06/09/2017), Disp: 20 tablet, Rfl: 0 No current facility-administered medications for this visit.   Facility-Administered Medications Ordered in Other Visits:  .  ondansetron (ZOFRAN) 8 mg, dexamethasone (DECADRON) 10 mg in sodium chloride 0.9 % 50 mL IVPB, , Intravenous, Once, Finnegan, Kathlene November, MD  Review of Systems  Constitutional: Positive for chills and diaphoresis.  HENT: Positive for congestion, ear pain (left), hoarse voice, sinus pressure, sneezing and sore throat.   Respiratory: Positive for cough (dry) and  shortness of breath (baseline).   Musculoskeletal: Negative for neck pain.  Neurological: Negative for headaches.    Social History   Tobacco Use  . Smoking status: Former Smoker    Packs/day: 1.00    Years: 50.00    Pack years: 50.00    Types: Cigarettes    Last attempt to quit: 10/03/2015    Years since quitting: 1.8  . Smokeless tobacco: Never Used  . Tobacco comment: smokes 3 cigs daily 05/06/15. Pt instructed to quit.  Substance Use Topics  . Alcohol use: No    Alcohol/week: 0.0 oz   Objective:   BP 120/66 (BP Location: Left Arm, Patient Position: Sitting, Cuff Size: Normal)   Pulse 69   Temp 98 F (36.7 C) (Oral)   Resp 16   Wt 147 lb (66.7 kg)   SpO2 93%   BMI 25.23 kg/m  Vitals:   07/21/17 1450  BP: 120/66  Pulse: 69  Resp: 16  Temp: 98 F (36.7 C)  TempSrc: Oral  SpO2: 93%  Weight: 147 lb (66.7 kg)     Physical Exam  Constitutional: She is oriented to person, place, and time. She appears well-developed and well-nourished. No distress.  HENT:  Head: Normocephalic and atraumatic.  Right Ear: Tympanic membrane, external ear and ear canal normal.  Left Ear: Tympanic membrane, external ear and ear canal normal.  Nose: Mucosal edema present. Right sinus exhibits maxillary sinus tenderness and frontal sinus tenderness. Left sinus exhibits maxillary sinus tenderness and frontal sinus tenderness.  Mouth/Throat: Uvula is midline and mucous membranes are normal. Posterior oropharyngeal erythema present. No oropharyngeal exudate or posterior oropharyngeal edema.  Eyes: Pupils are equal, round, and reactive to light. EOM and lids are normal. Right conjunctiva is injected. Left conjunctiva is not injected.  Neck: Neck supple. No thyromegaly present.  Cardiovascular: Normal rate and regular rhythm.  Pulmonary/Chest: Effort normal and breath sounds normal. No respiratory distress. She has no wheezes. She has no rales.  Musculoskeletal: She exhibits no edema.    Lymphadenopathy:    She has no cervical adenopathy.  Neurological: She is alert and oriented to person, place, and time.  Skin: Skin is warm and dry. Capillary refill takes less than 2 seconds. No rash noted.  Psychiatric: She has a normal mood and affect. Her behavior is normal.  Vitals reviewed.      Assessment & Plan:   1. Acute non-recurrent pansinusitis - symptoms and time course c/w sinusitis - given duration of symptoms will treat for bacterial sinusitis - 7d course of augmentin - discussed treatment of allergic rhinitis and other symptomatic treatment -Return precautions discussed  2. Bacterial conjunctivitis of right eye -Right eye concerning for bacterial conjunctivitis -We will treat with 5 days of Polytrim eyedrops - Return precautions discussed   Meds ordered this encounter  Medications  . amoxicillin-clavulanate (AUGMENTIN) 875-125 MG tablet    Sig: Take 1 tablet by mouth 2 (two) times daily.  Dispense:  14 tablet    Refill:  0  . trimethoprim-polymyxin b (POLYTRIM) ophthalmic solution    Sig: Place 1 drop into the right eye every 6 (six) hours for 5 days.    Dispense:  10 mL    Refill:  0  . fluticasone (FLONASE) 50 MCG/ACT nasal spray    Sig: Place 2 sprays into both nostrils daily.    Dispense:  16 g    Refill:  6     Return if symptoms worsen or fail to improve.   The entirety of the information documented in the History of Present Illness, Review of Systems and Physical Exam were personally obtained by me. Portions of this information were initially documented by Raquel Sarna Ratchford, CMA and reviewed by me for thoroughness and accuracy.    Virginia Crews, MD, MPH [facility] 07/22/2017 8:45 AM

## 2017-07-24 DIAGNOSIS — J449 Chronic obstructive pulmonary disease, unspecified: Secondary | ICD-10-CM | POA: Diagnosis not present

## 2017-07-24 DIAGNOSIS — R0602 Shortness of breath: Secondary | ICD-10-CM | POA: Diagnosis not present

## 2017-07-26 DIAGNOSIS — J449 Chronic obstructive pulmonary disease, unspecified: Secondary | ICD-10-CM | POA: Diagnosis not present

## 2017-07-27 ENCOUNTER — Other Ambulatory Visit: Payer: Self-pay | Admitting: Physician Assistant

## 2017-07-27 ENCOUNTER — Other Ambulatory Visit: Payer: Self-pay | Admitting: Family Medicine

## 2017-07-27 DIAGNOSIS — H60592 Other noninfective acute otitis externa, left ear: Secondary | ICD-10-CM

## 2017-07-28 NOTE — Telephone Encounter (Signed)
Patient should not need refill on antibiotic eye drops.  She needs OV to be re-evaluated if her eye is still red/painful/draining.  Virginia Crews, MD, MPH [facility] 07/28/2017 8:17 AM

## 2017-08-23 DIAGNOSIS — J449 Chronic obstructive pulmonary disease, unspecified: Secondary | ICD-10-CM | POA: Diagnosis not present

## 2017-08-23 DIAGNOSIS — R0602 Shortness of breath: Secondary | ICD-10-CM | POA: Diagnosis not present

## 2017-08-25 DIAGNOSIS — J449 Chronic obstructive pulmonary disease, unspecified: Secondary | ICD-10-CM | POA: Diagnosis not present

## 2017-09-07 ENCOUNTER — Ambulatory Visit
Admission: RE | Admit: 2017-09-07 | Discharge: 2017-09-07 | Disposition: A | Discharge status: Home / Self Care | Payer: Medicare Other | Source: Ambulatory Visit | Attending: Oncology | Admitting: Oncology

## 2017-09-07 ENCOUNTER — Inpatient Hospital Stay: Payer: Medicare Other | Attending: Oncology

## 2017-09-07 ENCOUNTER — Telehealth: Payer: Self-pay | Admitting: Family Medicine

## 2017-09-07 ENCOUNTER — Ambulatory Visit (HOSPITAL_COMMUNITY): Payer: Medicare Other

## 2017-09-07 ENCOUNTER — Other Ambulatory Visit: Payer: Self-pay | Admitting: *Deleted

## 2017-09-07 ENCOUNTER — Other Ambulatory Visit: Payer: Self-pay | Admitting: Family Medicine

## 2017-09-07 DIAGNOSIS — N289 Disorder of kidney and ureter, unspecified: Secondary | ICD-10-CM | POA: Diagnosis not present

## 2017-09-07 DIAGNOSIS — R42 Dizziness and giddiness: Secondary | ICD-10-CM | POA: Diagnosis not present

## 2017-09-07 DIAGNOSIS — R531 Weakness: Secondary | ICD-10-CM | POA: Insufficient documentation

## 2017-09-07 DIAGNOSIS — I700 Atherosclerosis of aorta: Secondary | ICD-10-CM | POA: Diagnosis not present

## 2017-09-07 DIAGNOSIS — R918 Other nonspecific abnormal finding of lung field: Secondary | ICD-10-CM | POA: Diagnosis not present

## 2017-09-07 DIAGNOSIS — C3411 Malignant neoplasm of upper lobe, right bronchus or lung: Secondary | ICD-10-CM | POA: Diagnosis not present

## 2017-09-07 DIAGNOSIS — I2510 Atherosclerotic heart disease of native coronary artery without angina pectoris: Secondary | ICD-10-CM | POA: Diagnosis not present

## 2017-09-07 DIAGNOSIS — R5383 Other fatigue: Secondary | ICD-10-CM | POA: Insufficient documentation

## 2017-09-07 DIAGNOSIS — R296 Repeated falls: Secondary | ICD-10-CM | POA: Insufficient documentation

## 2017-09-07 DIAGNOSIS — Z01812 Encounter for preprocedural laboratory examination: Secondary | ICD-10-CM

## 2017-09-07 DIAGNOSIS — J439 Emphysema, unspecified: Secondary | ICD-10-CM | POA: Diagnosis not present

## 2017-09-07 DIAGNOSIS — D649 Anemia, unspecified: Secondary | ICD-10-CM | POA: Insufficient documentation

## 2017-09-07 DIAGNOSIS — Z87891 Personal history of nicotine dependence: Secondary | ICD-10-CM | POA: Insufficient documentation

## 2017-09-07 DIAGNOSIS — C3490 Malignant neoplasm of unspecified part of unspecified bronchus or lung: Secondary | ICD-10-CM | POA: Diagnosis not present

## 2017-09-07 DIAGNOSIS — J438 Other emphysema: Secondary | ICD-10-CM

## 2017-09-07 LAB — CREATININE, SERUM
Creatinine, Ser: 1.22 mg/dL — ABNORMAL HIGH (ref 0.44–1.00)
GFR, EST AFRICAN AMERICAN: 49 mL/min — AB (ref >60)
GFR, EST NON AFRICAN AMERICAN: 42 mL/min — AB (ref >60)

## 2017-09-07 MED ORDER — IOHEXOL 300 MG/ML  SOLN
60.0000 mL | Freq: Once | INTRAMUSCULAR | Status: AC | PRN
  Administered 2017-09-07: 60 mL via INTRAVENOUS

## 2017-09-07 MED ORDER — IOPAMIDOL (ISOVUE-300) INJECTION 61%: 60.0000 mL | Freq: Once | INTRAVENOUS | Status: DC | PRN

## 2017-09-07 MED ORDER — BUDESONIDE-FORMOTEROL FUMARATE 160-4.5 MCG/ACT IN AERO
2.0000 | INHALATION_SPRAY | Freq: Two times a day (BID) | RESPIRATORY_TRACT | 0 refills | Status: DC
Start: 2017-09-07 — End: 2017-09-08

## 2017-09-07 NOTE — Telephone Encounter (Signed)
Pt needs refill on all her prescriptions  Atorvastatin 40 mg  generic Symbicort 160-4.5  gabapentin  600 mg  Glimepiride 2 mg  Metoprolol 25 mg  Generic Imdur 30 mg  Pantoprazole 40mg   She uses [facility]  Pt's call back is  [phone]

## 2017-09-08 ENCOUNTER — Other Ambulatory Visit: Payer: Self-pay | Admitting: Family Medicine

## 2017-09-08 ENCOUNTER — Other Ambulatory Visit: Payer: Self-pay

## 2017-09-08 DIAGNOSIS — J438 Other emphysema: Secondary | ICD-10-CM

## 2017-09-08 MED ORDER — BUDESONIDE-FORMOTEROL FUMARATE 160-4.5 MCG/ACT IN AERO
2.0000 | INHALATION_SPRAY | Freq: Two times a day (BID) | RESPIRATORY_TRACT | 3 refills | Status: DC
Start: 2017-09-08 — End: 2018-09-25

## 2017-09-08 MED ORDER — GLIMEPIRIDE 2 MG PO TABS: 2.0000 mg | ORAL_TABLET | Freq: Two times a day (BID) | ORAL | 3 refills | Status: DC

## 2017-09-08 NOTE — Telephone Encounter (Signed)
[facility] pharmacy faxed a refill request for the following medication. Thanks CC  glimepiride (AMARYL) 2 MG tablet

## 2017-09-12 NOTE — Progress Notes (Signed)
[place]  [phone] Fax:(336) [phone]  ID: [name] [name] OB: 07/25/1940  MR#: [id number]  LDJ#:[id number]  Patient Care Team: Jerrol Banana., MD as PCP - General (Unknown Physician Specialty) [name], MD as Consulting Physician (Cardiology) [name], MD as Consulting Physician (Vascular Surgery) Pa, [place] as Consulting Physician (Optometry) [name], Kathlene November, MD as Consulting Physician (Oncology) [name], MD as Referring Physician (Radiation Oncology) Murlean Iba, MD as Consulting Physician (Internal Medicine)  CHIEF COMPLAINT: Clinical stage IIIa small cell lung carcinoma of the right upper lobe lung.  INTERVAL HISTORY: Patient returns to clinic today for further evaluation and discussion of her imaging results.  She has chronic weakness and fatigue.  She also has dizziness and difficulty with balance reporting several falls in the interim. She has no other neurologic complaints.  She denies any recent fevers or illnesses.  She denies any chest pain, shortness of breath, cough, or hemoptysis. She has a good appetite and denies weight loss. She denies any nausea, vomiting, constipation, or diarrhea. She has no urinary complaints.  Patient offers no further specific complaints today.  REVIEW OF SYSTEMS:   Review of Systems  Constitutional: Positive for malaise/fatigue. Negative for fever and weight loss.  Respiratory: Negative.  Negative for cough, hemoptysis and shortness of breath.   Cardiovascular: Negative.  Negative for chest pain and leg swelling.  Gastrointestinal: Negative for abdominal pain, melena, nausea and vomiting.  Genitourinary: Negative.  Negative for dysuria.  Musculoskeletal: Positive for falls.  Skin: Negative.  Negative for rash.  Neurological: Positive for dizziness and weakness. Negative for sensory change, focal weakness and headaches.  Psychiatric/Behavioral: Negative.  The  patient is not nervous/anxious.     As per HPI. Otherwise, a complete review of systems is negative.  PAST MEDICAL HISTORY: Past Medical History:  Diagnosis Date  . Abnormal CT lung screening 10/17/2015  . COPD (chronic obstructive pulmonary disease) ([place])   . Coronary artery disease, non-occlusive    a. cath 2006: min nonobs CAD; b. cath 12/2010: cath LAD 50%, RCA 60%; c. 08/2013: Minimal luminal irregs, right dominant system with no significant CAD, diffuse luminal irregs noted. Normal EF 55%, no AS or MS.   . Diabetes mellitus   . Hyperlipemia    Followed by Dr. Rosanna Randy  . Hypertension   . Lung cancer ([place])   . Macular degeneration    rt  . Personal history of tobacco use, presenting hazards to health 10/15/2015  . Pneumonia    hx  . Shortness of breath   . Stroke [facility])     PAST SURGICAL HISTORY: Past Surgical History:  Procedure Laterality Date  . ABDOMINAL HYSTERECTOMY    . ANTERIOR CERVICAL DECOMP/DISCECTOMY FUSION N/A 10/19/2013   Procedure: CERVICAL FIVE-SIX ANTERIOR CERVICAL DECOMPRESSION WITH FUSION INTERBODY PROSTHESIS PLATING AND PEEK CAGE;  Surgeon: Ophelia Charter, MD;  Location: [facility];  Service: Neurosurgery;  Laterality: N/A;  . BACK SURGERY  80's  . BREAST CYST EXCISION Left    left negative   . CARDIAC CATHETERIZATION  05/2004  . CATARACT EXTRACTION Left   . CHOLECYSTECTOMY    . ENDARTERECTOMY Left 10/24/2014   Procedure: ENDARTERECTOMY CAROTID;  Surgeon: Algernon Huxley, MD;  Location: [facility] ORS;  Service: Vascular;  Laterality: Left;  . ENDOBRONCHIAL ULTRASOUND N/A 11/14/2015   Procedure: ENDOBRONCHIAL ULTRASOUND;  Surgeon: [name], MD;  Location: [facility] ORS;  Service: Cardiopulmonary;  Laterality: N/A;  . PERIPHERAL VASCULAR CATHETERIZATION N/A 12/04/2015  Procedure: Porta Cath Insertion;  Surgeon: Algernon Huxley, MD;  Location: [facility];  Service: Cardiovascular;  Laterality: N/A;  . PORTA CATH REMOVAL N/A 05/17/2017   Procedure: PORTA CATH  REMOVAL;  Surgeon: Algernon Huxley, MD;  Location: [facility];  Service: Cardiovascular;  Laterality: N/A;  . TONSILLECTOMY AND ADENOIDECTOMY    . VESICOVAGINAL FISTULA CLOSURE W/ TAH      FAMILY HISTORY: Family History  Problem Relation Age of Onset  . Stroke Mother        Massive  . Heart attack Father        Massive  . Heart attack Brother   . Heart attack Brother   . Lung cancer Maternal Grandfather   . Heart attack Paternal Grandmother        MI       ADVANCED DIRECTIVES (Y/N):  N   HEALTH MAINTENANCE: Social History   Tobacco Use  . Smoking status: Former Smoker    Packs/day: 1.00    Years: 50.00    Pack years: 50.00    Types: Cigarettes    Last attempt to quit: 10/03/2015    Years since quitting: 1.9  . Smokeless tobacco: Never Used  . Tobacco comment: smokes 3 cigs daily 05/06/15. Pt instructed to quit.  Substance Use Topics  . Alcohol use: No    Alcohol/week: 0.0 oz  . Drug use: No     Colonoscopy:  PAP:  Bone density:  Lipid panel:  Allergies  Allergen Reactions  . Coconut Fatty Acids Swelling and Other (See Comments)    Throat swells    Current Outpatient Medications  Medication Sig Dispense Refill  . albuterol (PROVENTIL) (5 MG/ML) 0.5% nebulizer solution Take 0.5 mLs (2.5 mg total) by nebulization every 6 (six) hours as needed for wheezing or shortness of breath. (Patient taking differently: Take 2.5 mg by nebulization daily. ) 100 vial 0  . amoxicillin-clavulanate (AUGMENTIN) 875-125 MG tablet Take 1 tablet by mouth 2 (two) times daily. 14 tablet 0  . atorvastatin (LIPITOR) 40 MG tablet Take 0.5 tablets (20 mg total) by mouth at bedtime. 90 tablet 3  . budesonide-formoterol (SYMBICORT) 160-4.5 MCG/ACT inhaler Inhale 2 puffs into the lungs 2 (two) times daily. 3 Inhaler 3  . fluticasone (FLONASE) 50 MCG/ACT nasal spray Place 2 sprays into both nostrils daily. 16 g 6  . gabapentin (NEURONTIN) 600 MG tablet TAKE ONE-HALF TABLET BY  MOUTH TWO  TIMES DAILY 90 tablet 3  . glimepiride (AMARYL) 2 MG tablet Take 1 tablet (2 mg total) by mouth 2 (two) times daily. 180 tablet 3  . isosorbide mononitrate (IMDUR) 30 MG 24 hr tablet TAKE 1 TABLET BY MOUTH  DAILY 90 tablet 3  . LORazepam (ATIVAN) 1 MG tablet Take 1 tablet (1 mg total) by mouth 2 (two) times daily as needed for anxiety. 30 tablet 5  . magnesium oxide (MAG-OX) 400 (241.3 MG) MG tablet Take 400 mg by mouth 2 (two) times daily.     . metoprolol tartrate (LOPRESSOR) 25 MG tablet Take 1 tablet (25 mg total) by mouth 2 (two) times daily. 180 tablet 3  . mometasone (ELOCON) 0.1 % cream Apply small amount nightly to each ear 45 g 0  . Multiple Vitamins-Minerals (ICAPS) CAPS Take 1 capsule by mouth 2 (two) times daily.     . nitroGLYCERIN (NITROSTAT) 0.4 MG SL tablet Place 1 tablet (0.4 mg total) under the tongue every 5 (five) minutes as needed for chest pain.  25 tablet 3  . ofloxacin (FLOXIN) 0.3 % OTIC solution PLACE 10 DROPS INTO THE LEFT EAR DAILY. 5 mL 0  . ondansetron (ZOFRAN) 8 MG tablet Take 1 tablet (8 mg total) by mouth every 8 (eight) hours as needed for nausea or vomiting. 30 tablet 1  . pantoprazole (PROTONIX) 40 MG tablet Take 1 tablet (40 mg total) by mouth daily. 90 tablet 3  . HYDROcodone-acetaminophen (NORCO) 5-325 MG tablet Take 1 tablet by mouth every 6 (six) hours as needed for moderate pain or severe pain. (Patient not taking: Reported on 06/09/2017) 20 tablet 0   No current facility-administered medications for this visit.    Facility-Administered Medications Ordered in Other Visits  Medication Dose Route Frequency Provider Last Rate Last Dose  . ondansetron (ZOFRAN) 8 mg, dexamethasone (DECADRON) 10 mg in sodium chloride 0.9 % 50 mL IVPB   Intravenous Once Lloyd Huger, MD        OBJECTIVE: Vitals:   09/15/17 1435  BP: 123/89  Pulse: 69  Resp: 18  Temp: 97.7 F (36.5 C)     There is no height or weight on file to calculate BMI.    ECOG FS:1 -  Symptomatic but completely ambulatory  General: Well-developed, well-nourished, no acute distress.  Sitting in a wheelchair. Eyes: Pink conjunctiva, anicteric sclera. Lungs: Clear to auscultation bilaterally. Heart: Regular rate and rhythm. No rubs, murmurs, or gallops. Abdomen: Soft, nontender, nondistended. No organomegaly noted, normoactive bowel sounds. Musculoskeletal: No edema, cyanosis, or clubbing. Neuro: Alert, answering all questions appropriately. Cranial nerves grossly intact. Skin: No rashes or petechiae noted. Psych: Normal affect.  LAB RESULTS:  Lab Results  Component Value Date   NA 137 04/03/2017   K 4.4 04/03/2017   CL 99 (L) 04/03/2017   CO2 30 04/03/2017   GLUCOSE 179 (H) 04/03/2017   BUN 26 (H) 04/03/2017   CREATININE 1.22 (H) 09/07/2017   CALCIUM 8.9 04/03/2017   PROT 7.4 03/30/2017   ALBUMIN 3.4 (L) 03/30/2017   AST 34 03/30/2017   ALT 36 03/30/2017   ALKPHOS 83 03/30/2017   BILITOT 0.7 03/30/2017   GFRNONAA 42 (L) 09/07/2017   GFRAA 49 (L) 09/07/2017    Lab Results  Component Value Date   WBC 7.9 04/02/2017   NEUTROABS 3.9 03/30/2017   HGB 8.4 (L) 04/03/2017   HCT 23.3 (L) 04/02/2017   MCV 93.6 04/02/2017   PLT 256 04/02/2017     STUDIES: Ct Chest W Contrast  Result Date: 09/07/2017 CLINICAL DATA:  Restaging lung cancer. EXAM: CT CHEST WITH CONTRAST TECHNIQUE: Multidetector CT imaging of the chest was performed during intravenous contrast administration. CONTRAST:  65mL OMNIPAQUE IOHEXOL 300 MG/ML  SOLN COMPARISON:  03/17/2017 FINDINGS: Cardiovascular: The heart size appears within normal limits. Aortic atherosclerosis noted. Calcifications noted in the LAD, RCA and left circumflex coronary arteries. No pericardial effusion visualized. Mediastinum/Nodes: Normal appearance of the thyroid gland. The trachea appears patent and is midline. Normal appearance of the esophagus. Lungs/Pleura: There is no pleural effusion. Moderate changes of emphysema.  The small index nodule within the posteromedial right upper lobe measures 5 mm and is stable from comparison exam, image 32/3. Within the superior segment of right lower lobe there is a new area of airspace consolidation measuring 1.8 x 3.2 cm, image 63/3. This obscures the previously noted non-focal ground-glass opacity with small, 6 mm lung nodular component, image 64/3. Stable 3 mm lingular nodule, image 96/3. Upper Abdomen: Bilateral low-attenuation adrenal nodules are again noted compatible with  benign adenomas. Similar to comparison exam. Musculoskeletal: Degenerative disc disease noted within the thoracic spine. There is no aggressive lytic or sclerotic bone lesions. IMPRESSION: 1. Interval development of focal area airspace consolidation within superior segment of right lower lobe in the area of previously described nonfocal ground-glass opacity with nodular components. The appearance is nonspecific and may be inflammatory or infectious in etiology. Suggest 3 month follow-up exam following resolution of any acute symptoms the patient may be having, to confirm resolution. 2. Scattered small pulmonary nodules are stable compared with previous exam. 3. Aortic Atherosclerosis (ICD10-I70.0) and Emphysema (ICD10-J43.9). 4. Three vessel coronary artery atherosclerotic calcifications. Electronically Signed   By: Kerby Moors M.D.   On: 09/07/2017 14:09    ASSESSMENT: Clinical stage IIIa small cell lung carcinoma of the right upper lobe lung.  PLAN:    1. Clinical stage IIIa small cell lung carcinoma of the right upper lobe lung: Patient completed cycle 3 of cisplatin and etoposide on October 24-26, 2017. Given patient's extensive hospitalization after cycle 3, treatment was discontinued.  Previously, MRI of the brain was negative and patient has completed PCI.  CT scan results from September 07, 2017 reviewed independently and reported as above with interval development of focal airspace consolidation in the  right lower lobe.  This appears to be inflammatory or infectious etiology.  No obvious evidence of recurrence.  Per radiology recommendations, will repeat CT scan in 3 months.  Patient will follow-up 1 to 2 days later to discuss the results. 2. Anemia: Decreased, but stable. No intervention is needed at this time. Monitor. 3. Renal insufficiency: Patient's creatinine appears to be at her baseline, monitor. 4.  Dizziness/falls: Continue follow-up and treatment per primary care.  Approximately 30 minutes was spent in discussion of which greater than 50% was consultation.   Patient expressed understanding and was in agreement with this plan. She also understands that She can call clinic at any time with any questions, concerns, or complaints.   Cancer Staging Primary cancer of right upper lobe of lung [facility]) Staging form: Lung, AJCC 7th Edition - Clinical stage from 11/26/2015: Stage IIIA (T1a, N2, M0) - Signed by Lloyd Huger, MD on 11/26/2015   Lloyd Huger, MD   09/19/2017 7:13 AM

## 2017-09-15 ENCOUNTER — Inpatient Hospital Stay (HOSPITAL_BASED_OUTPATIENT_CLINIC_OR_DEPARTMENT_OTHER): Payer: Medicare Other | Admitting: Oncology

## 2017-09-15 ENCOUNTER — Encounter: Payer: Self-pay | Admitting: Oncology

## 2017-09-15 VITALS — BP 123/89 | HR 69 | Temp 97.7°F | Resp 18

## 2017-09-15 DIAGNOSIS — Z87891 Personal history of nicotine dependence: Secondary | ICD-10-CM

## 2017-09-15 DIAGNOSIS — C3411 Malignant neoplasm of upper lobe, right bronchus or lung: Secondary | ICD-10-CM | POA: Diagnosis not present

## 2017-09-15 DIAGNOSIS — N289 Disorder of kidney and ureter, unspecified: Secondary | ICD-10-CM | POA: Diagnosis not present

## 2017-09-15 DIAGNOSIS — D649 Anemia, unspecified: Secondary | ICD-10-CM

## 2017-09-15 DIAGNOSIS — R296 Repeated falls: Secondary | ICD-10-CM | POA: Diagnosis not present

## 2017-09-15 DIAGNOSIS — R531 Weakness: Secondary | ICD-10-CM | POA: Diagnosis not present

## 2017-09-15 DIAGNOSIS — R42 Dizziness and giddiness: Secondary | ICD-10-CM | POA: Diagnosis not present

## 2017-09-15 DIAGNOSIS — R5383 Other fatigue: Secondary | ICD-10-CM

## 2017-09-15 NOTE — Progress Notes (Signed)
Patient reports weakness, dizziness and difficulty with balance. Patient reports multiple falls.

## 2017-09-21 ENCOUNTER — Ambulatory Visit (INDEPENDENT_AMBULATORY_CARE_PROVIDER_SITE_OTHER): Payer: Medicare Other | Admitting: Family Medicine

## 2017-09-21 VITALS — BP 120/80 | HR 66 | Temp 97.9°F | Resp 16 | Wt 149.0 lb

## 2017-09-21 DIAGNOSIS — J432 Centrilobular emphysema: Secondary | ICD-10-CM

## 2017-09-21 DIAGNOSIS — E1342 Other specified diabetes mellitus with diabetic polyneuropathy: Secondary | ICD-10-CM | POA: Diagnosis not present

## 2017-09-21 DIAGNOSIS — M791 Myalgia, unspecified site: Secondary | ICD-10-CM

## 2017-09-21 DIAGNOSIS — I10 Essential (primary) hypertension: Secondary | ICD-10-CM

## 2017-09-21 DIAGNOSIS — R29898 Other symptoms and signs involving the musculoskeletal system: Secondary | ICD-10-CM

## 2017-09-21 DIAGNOSIS — L309 Dermatitis, unspecified: Secondary | ICD-10-CM | POA: Diagnosis not present

## 2017-09-21 LAB — POCT GLYCOSYLATED HEMOGLOBIN (HGB A1C): HEMOGLOBIN A1C: 6.5 % — AB (ref 4.0–5.6)

## 2017-09-21 MED ORDER — TRIAMCINOLONE ACETONIDE 0.1 % EX CREA: 1.0000 "application " | TOPICAL_CREAM | Freq: Two times a day (BID) | CUTANEOUS | 0 refills | Status: DC

## 2017-09-21 MED ORDER — ISOSORBIDE MONONITRATE ER 30 MG PO TB24: 30.0000 mg | ORAL_TABLET | Freq: Every day | ORAL | 3 refills | Status: DC

## 2017-09-21 NOTE — Patient Instructions (Signed)
Stop Atorvastatin  Use cream on rash  Will call physical therapy and they will contact you

## 2017-09-21 NOTE — Progress Notes (Signed)
[name] [name]  MRN: [id number] DOB: 08-24-1940  Subjective:  HPI   The patient is a 77 year old female who presents for follow up of chronic health.  She was last seen on 07/21/17 for an acute visit and her [place] on 05/31/17  Diabetes- The patient had her last A1C on 05/24/17 and it was 8.2.  She checks her glucose at home and states she has been getting readings in the range of 109-130.    Hypertension-The patient has had stable blood pressure on the last visit.  She checks her blood pressure at home however she said she does not think her machine is working properly because she will get such a large range of readings.   The patient's husband was concerned about her leg weakness.  He has asked if it could be the Atorvastatin causing the weakness.  He also asked if physical therapy would help.  The patient states that she loses her balance a lot more now.  Rash-The patient has a rash of red, raised bumps that itch and burn.  They are across her chest and on the back of her neck.   She has had the rash for about 1 week.  She denies any fevers or associated symptoms.      Rash   Patient Active Problem List   Diagnosis Date Noted  . Malnutrition of moderate degree 03/31/2017  . Acute respiratory failure with hypoxia ([place]) 03/30/2017  . AKI (acute kidney injury) ([place]) 07/15/2016  . Protein-calorie malnutrition, severe 02/08/2016  . Primary cancer of right upper lobe of lung ([place]) 11/20/2015  . Abnormal CT lung screening 10/17/2015  . Personal history of tobacco use, presenting hazards to health 10/15/2015  . Chronic vulvitis 09/26/2014  . Allergic reaction 09/26/2014  . Carotid stenosis 08/30/2014  . Cervical nerve root disorder 08/10/2014  . CAD in native artery 08/10/2014  . B12 deficiency 08/10/2014  . Back ache 08/10/2014  . Bronchitis, chronic ([place]) 08/10/2014  . Diabetes mellitus with polyneuropathy ([place]) 08/10/2014  . Can't get food down 08/10/2014  . Eczema of external ear  08/10/2014  . Accumulation of fluid in tissues 08/10/2014  . Gout 08/10/2014  . Adult hypothyroidism 08/10/2014  . Mononeuritis 08/10/2014  . Muscle ache 08/10/2014  . Disorder of peripheral nervous system 08/10/2014  . Lesion of vulva 08/10/2014  . Cervical spondylosis with radiculopathy 10/19/2013  . COPD exacerbation ([place]) 03/29/2013  . CAD (coronary artery disease) 06/22/2011  . COPD (chronic obstructive pulmonary disease) with emphysema ([place]) 03/07/2010  . CHEST PAIN UNSPECIFIED 07/22/2009  . HLD (hyperlipidemia) 04/01/2009  . Malaise and fatigue 04/01/2009  . Hyperlipidemia 01/18/2009  . TOBACCO ABUSE 01/18/2009  . HYPERTENSION, BENIGN 01/18/2009  . CLAUDICATION 01/18/2009  . Pain in limb 01/18/2009  . CAFL (chronic airflow limitation) ([place]) 01/20/2007  . Late effects of cerebrovascular disease 01/10/2007  . Essential (primary) hypertension 12/22/2006    Past Medical History:  Diagnosis Date  . Abnormal CT lung screening 10/17/2015  . COPD (chronic obstructive pulmonary disease) ([place])   . Coronary artery disease, non-occlusive    a. cath 2006: min nonobs CAD; b. cath 12/2010: cath LAD 50%, RCA 60%; c. 08/2013: Minimal luminal irregs, right dominant system with no significant CAD, diffuse luminal irregs noted. Normal EF 55%, no AS or MS.   . Diabetes mellitus   . Hyperlipemia    Followed by Dr. Rosanna Randy  . Hypertension   . Lung cancer ([place])   . Macular degeneration  rt  . Personal history of tobacco use, presenting hazards to health 10/15/2015  . Pneumonia    hx  . Shortness of breath   . Stroke [facility])     Social History   Socioeconomic History  . Marital status: Married    Spouse name: Not on file  . Number of children: 4  . Years of education: Not on file  . Highest education level: 11th grade  Occupational History  . Occupation: Retired, Licensed conveyancer: RETIRED  Social Needs  . Financial resource strain: Not on file  . Food insecurity:     Worry: Never true    Inability: Never true  . Transportation needs:    Medical: No    Non-medical: No  Tobacco Use  . Smoking status: Former Smoker    Packs/day: 1.00    Years: 50.00    Pack years: 50.00    Types: Cigarettes    Last attempt to quit: 10/03/2015    Years since quitting: 1.9  . Smokeless tobacco: Never Used  . Tobacco comment: smokes 3 cigs daily 05/06/15. Pt instructed to quit.  Substance and Sexual Activity  . Alcohol use: No    Alcohol/week: 0.0 oz  . Drug use: No  . Sexual activity: Never  Lifestyle  . Physical activity:    Days per week: Not on file    Minutes per session: Not on file  . Stress: Not at all  Relationships  . Social connections:    Talks on phone: Not on file    Gets together: Not on file    Attends religious service: Not on file    Active member of club or organization: Not on file    Attends meetings of clubs or organizations: Not on file    Relationship status: Not on file  . Intimate partner violence:    Fear of current or ex partner: Not on file    Emotionally abused: Not on file    Physically abused: Not on file    Forced sexual activity: Not on file  Other Topics Concern  . Not on file  Social History Narrative   Married with 4 children   Gets regular exercise    Outpatient Encounter Medications as of 09/21/2017  Medication Sig  . albuterol (PROVENTIL) (5 MG/ML) 0.5% nebulizer solution Take 0.5 mLs (2.5 mg total) by nebulization every 6 (six) hours as needed for wheezing or shortness of breath. (Patient taking differently: Take 2.5 mg by nebulization daily. )  . atorvastatin (LIPITOR) 40 MG tablet Take 0.5 tablets (20 mg total) by mouth at bedtime.  . budesonide-formoterol (SYMBICORT) 160-4.5 MCG/ACT inhaler Inhale 2 puffs into the lungs 2 (two) times daily.  . fluticasone (FLONASE) 50 MCG/ACT nasal spray Place 2 sprays into both nostrils daily.  Marland Kitchen gabapentin (NEURONTIN) 600 MG tablet TAKE ONE-HALF TABLET BY  MOUTH TWO TIMES  DAILY  . glimepiride (AMARYL) 2 MG tablet Take 1 tablet (2 mg total) by mouth 2 (two) times daily.  Marland Kitchen HYDROcodone-acetaminophen (NORCO) 5-325 MG tablet Take 1 tablet by mouth every 6 (six) hours as needed for moderate pain or severe pain.  . isosorbide mononitrate (IMDUR) 30 MG 24 hr tablet TAKE 1 TABLET BY MOUTH  DAILY  . magnesium oxide (MAG-OX) 400 (241.3 MG) MG tablet Take 400 mg by mouth 2 (two) times daily.   . metoprolol tartrate (LOPRESSOR) 25 MG tablet Take 1 tablet (25 mg total) by mouth 2 (two) times daily.  . mometasone (  ELOCON) 0.1 % cream Apply small amount nightly to each ear  . Multiple Vitamins-Minerals (ICAPS) CAPS Take 1 capsule by mouth 2 (two) times daily.   . nitroGLYCERIN (NITROSTAT) 0.4 MG SL tablet Place 1 tablet (0.4 mg total) under the tongue every 5 (five) minutes as needed for chest pain.  Marland Kitchen ondansetron (ZOFRAN) 8 MG tablet Take 1 tablet (8 mg total) by mouth every 8 (eight) hours as needed for nausea or vomiting.  . pantoprazole (PROTONIX) 40 MG tablet Take 1 tablet (40 mg total) by mouth daily.  Marland Kitchen LORazepam (ATIVAN) 1 MG tablet Take 1 tablet (1 mg total) by mouth 2 (two) times daily as needed for anxiety. (Patient not taking: Reported on 09/21/2017)  . ofloxacin (FLOXIN) 0.3 % OTIC solution PLACE 10 DROPS INTO THE LEFT EAR DAILY. (Patient not taking: Reported on 09/21/2017)  . [DISCONTINUED] amoxicillin-clavulanate (AUGMENTIN) 875-125 MG tablet Take 1 tablet by mouth 2 (two) times daily.   Facility-Administered Encounter Medications as of 09/21/2017  Medication  . ondansetron (ZOFRAN) 8 mg, dexamethasone (DECADRON) 10 mg in sodium chloride 0.9 % 50 mL IVPB    Allergies  Allergen Reactions  . Coconut Fatty Acids Swelling and Other (See Comments)    Throat swells    Review of Systems  Constitutional: Negative for chills, fever and malaise/fatigue.  HENT: Negative.   Eyes: Negative.   Respiratory: Positive for cough, shortness of breath and wheezing.     Cardiovascular: Positive for claudication (maybe). Negative for chest pain, palpitations, orthopnea and leg swelling.  Gastrointestinal: Negative.   Skin: Positive for rash.  Neurological: Positive for weakness.  Endo/Heme/Allergies: Negative.   Psychiatric/Behavioral: Negative.     Objective:  BP 120/80 (BP Location: Right Arm, Patient Position: Sitting, Cuff Size: Normal)   Pulse 66   Temp 97.9 F (36.6 C) (Oral)   Resp 16   Wt 149 lb (67.6 kg)   SpO2 97%   BMI 25.58 kg/m   Physical Exam  Constitutional: She is oriented to person, place, and time and well-developed, well-nourished, and in no distress.  HENT:  Head: Normocephalic and atraumatic.  Right Ear: External ear normal.  Left Ear: External ear normal.  Nose: Nose normal.  Eyes: Conjunctivae are normal. No scleral icterus.  Neck: No thyromegaly present.  Cardiovascular: Normal rate, regular rhythm and normal heart sounds.  Pulmonary/Chest: Effort normal and breath sounds normal.  Abdominal: Soft.  Neurological: She is alert and oriented to person, place, and time. Gait normal. GCS score is 15.  Skin: Skin is warm and dry. Rash noted.  Mild rash on upper chest and upper back--part exposed to sun above shirt.Marland Kitchen  Psychiatric: Mood, memory, affect and judgment normal.    Assessment and Plan :  1. Essential (primary) hypertension  - CBC with Differential/Platelet - Comprehensive metabolic panel  2. Centrilobular emphysema ([place])   3. Diabetic polyneuropathy associated with other specified diabetes mellitus ([place])  - POCT glycosylated hemoglobin (Hb A1C)  4. Weakness of both lower extremities Stop Lipitor for now. RTC 1 month. - CK - Ambulatory referral to Physical Therapy  5. Myalgia  - CK  6. Dermatitis May need derm referral. - triamcinolone cream (KENALOG) 0.1 %; Apply 1 application topically 2 (two) times daily.  Dispense: 30 g; Refill: 0  I have done the exam and reviewed the chart and it is  accurate to the best of my knowledge. Development worker, community has been used and  any errors in dictation or transcription are unintentional. Heide Scales.D.  [facility]

## 2017-09-22 LAB — CBC WITH DIFFERENTIAL/PLATELET
Basophils Absolute: 0 10*3/uL (ref 0.0–0.2)
Basos: 0 %
EOS (ABSOLUTE): 0.1 10*3/uL (ref 0.0–0.4)
Eos: 1 %
Hematocrit: 34.7 % (ref 34.0–46.6)
Hemoglobin: 11.6 g/dL (ref 11.1–15.9)
IMMATURE GRANS (ABS): 0 10*3/uL (ref 0.0–0.1)
IMMATURE GRANULOCYTES: 0 %
LYMPHS: 17 %
Lymphocytes Absolute: 1.2 10*3/uL (ref 0.7–3.1)
MCH: 31.2 pg (ref 26.6–33.0)
MCHC: 33.4 g/dL (ref 31.5–35.7)
MCV: 93 fL (ref 79–97)
MONOCYTES: 7 %
Monocytes Absolute: 0.5 10*3/uL (ref 0.1–0.9)
NEUTROS ABS: 5.3 10*3/uL (ref 1.4–7.0)
NEUTROS PCT: 75 %
PLATELETS: 234 10*3/uL (ref 150–450)
RBC: 3.72 x10E6/uL — AB (ref 3.77–5.28)
RDW: 14.6 % (ref 12.3–15.4)
WBC: 7.1 10*3/uL (ref 3.4–10.8)

## 2017-09-22 LAB — COMPREHENSIVE METABOLIC PANEL
ALBUMIN: 4.6 g/dL (ref 3.5–4.8)
ALT: 15 IU/L (ref 0–32)
AST: 13 IU/L (ref 0–40)
Albumin/Globulin Ratio: 1.9 (ref 1.2–2.2)
Alkaline Phosphatase: 85 IU/L (ref 39–117)
BUN / CREAT RATIO: 15 (ref 12–28)
BUN: 22 mg/dL (ref 8–27)
Bilirubin Total: 0.3 mg/dL (ref 0.0–1.2)
CALCIUM: 9.4 mg/dL (ref 8.7–10.3)
CHLORIDE: 101 mmol/L (ref 96–106)
CO2: 27 mmol/L (ref 20–29)
CREATININE: 1.42 mg/dL — AB (ref 0.57–1.00)
GFR, EST AFRICAN AMERICAN: 42 mL/min/{1.73_m2} — AB (ref >59)
GFR, EST NON AFRICAN AMERICAN: 36 mL/min/{1.73_m2} — AB (ref >59)
GLUCOSE: 160 mg/dL — AB (ref 65–99)
Globulin, Total: 2.4 g/dL (ref 1.5–4.5)
Potassium: 4.6 mmol/L (ref 3.5–5.2)
Sodium: 142 mmol/L (ref 134–144)
TOTAL PROTEIN: 7 g/dL (ref 6.0–8.5)

## 2017-09-22 LAB — CK: Total CK: 49 U/L (ref 24–173)

## 2017-09-23 DIAGNOSIS — J449 Chronic obstructive pulmonary disease, unspecified: Secondary | ICD-10-CM | POA: Diagnosis not present

## 2017-09-23 DIAGNOSIS — R0602 Shortness of breath: Secondary | ICD-10-CM | POA: Diagnosis not present

## 2017-09-25 DIAGNOSIS — J449 Chronic obstructive pulmonary disease, unspecified: Secondary | ICD-10-CM | POA: Diagnosis not present

## 2017-10-02 IMAGING — MR MR HEAD WO/W CM
13 series · 47 of 48 positions shown · IV contrast (multihance)
Comparison: MR brain 08/30/2014.

CLINICAL DATA: Lung cancer. Staging.No reported neurologic
symptoms.

EXAM:
MRI HEAD WITHOUT AND WITH CONTRAST
TECHNIQUE: Multiplanar, multiecho pulse sequences of the brain and surrounding
structures were obtained without and with intravenous contrast.
CONTRAST:  15mL MULTIHANCE GADOBENATE DIMEGLUMINE 529 MG/ML IV SOLN

[Series 2: T1 · sagittal · 5.0mm · 0.45mm/px · 3 of 27 slices shown (1 of 2)]
[im 1/27]
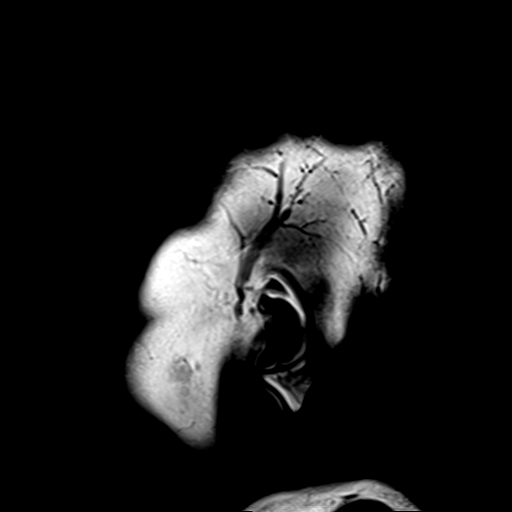
[im 14/27]
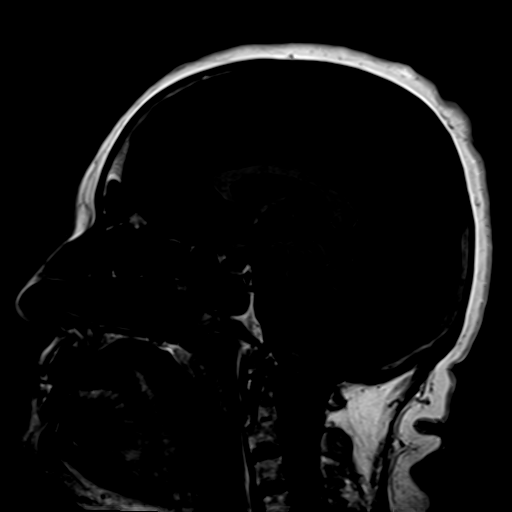
[im 27/27]
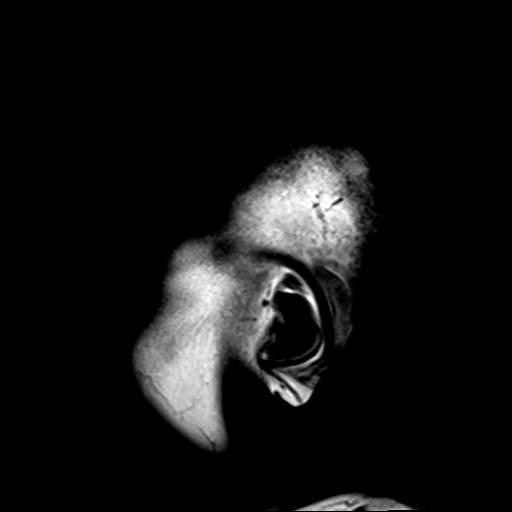

[Series 4: DWI · axial · 3.0mm · 1.80mm/px · z∈[-71,+90]mm · 5 of 55 slices shown (1 of 4)]
[im 1/55]
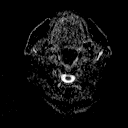
[im 14/55]
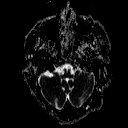
[im 28/55]
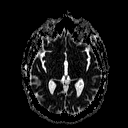
[im 41/55]
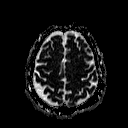
[im 55/55]
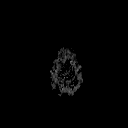

[Series 6: DWI · coronal · 3.0mm · 1.80mm/px · 4 of 45 slices shown (2 of 4)]
[im 1/45]
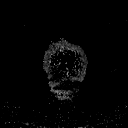
[im 15/45]
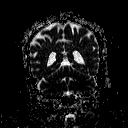
[im 30/45]
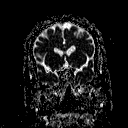
[im 45/45]
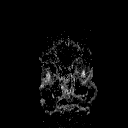

[Series 7: T2 · axial · 5.0mm · 0.60mm/px · z∈[-67,+88]mm · 2 of 25 slices shown (1 of 2)]
[im 1/25]
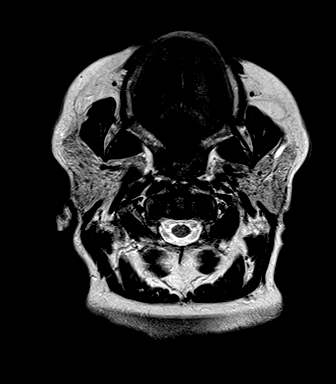
[im 25/25]
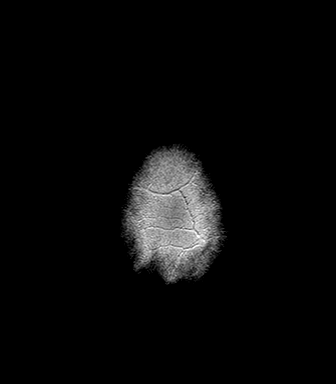

[Series 8: FLAIR · axial · 5.0mm · 0.45mm/px · z∈[-67,+88]mm · 2 of 25 slices shown]
[im 1/25]
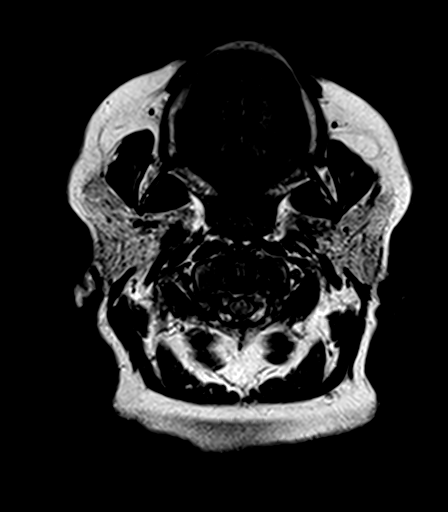
[im 25/25]
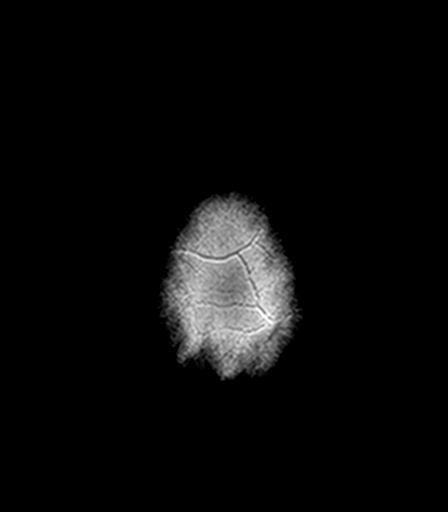

[Series 9: T2 · axial · 5.0mm · 0.45mm/px · z∈[-67,+88]mm · 2 of 25 slices shown (2 of 2)]
[im 1/25]
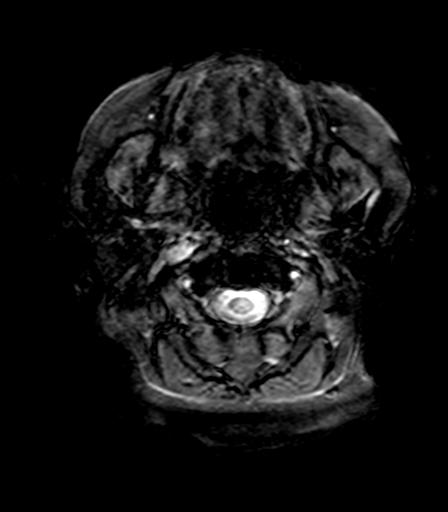
[im 25/25]
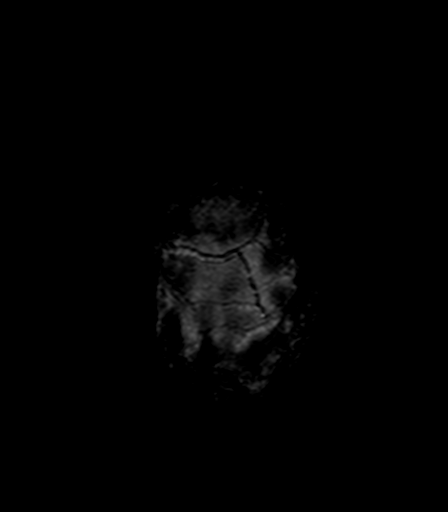

[Series 10: T1 · axial · 3.0mm · 1.00mm/px · z∈[-76,+64]mm · 5 of 60 slices shown (2 of 2)]
[im 1/60]
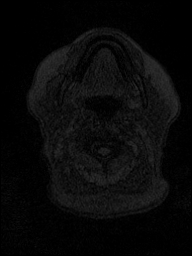
[im 12/60]
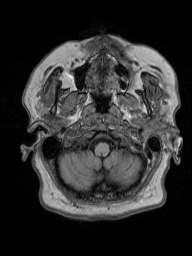
[im 24/60]
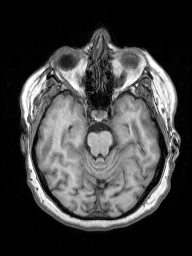
[im 36/60]
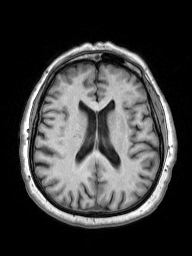
[im 48/60]
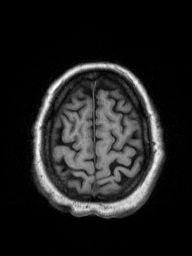

[Series 11: T2 post-contrast · coronal · 5.0mm · 0.49mm/px · 3 of 27 slices shown]
[im 1/27]
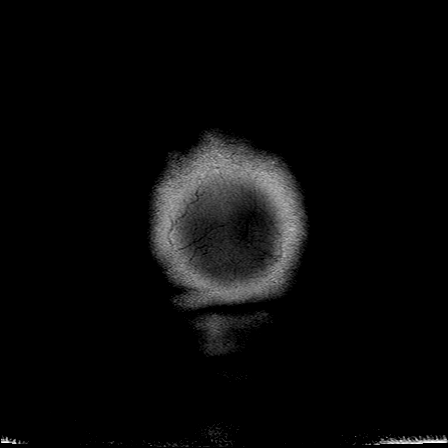
[im 14/27]
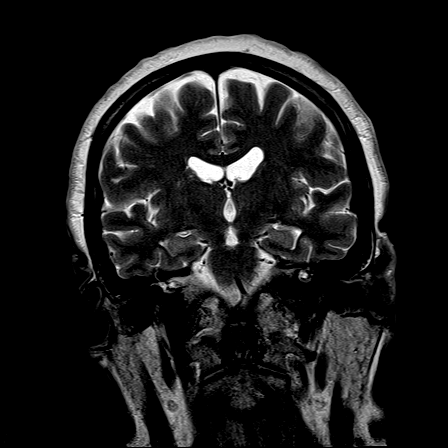
[im 27/27]
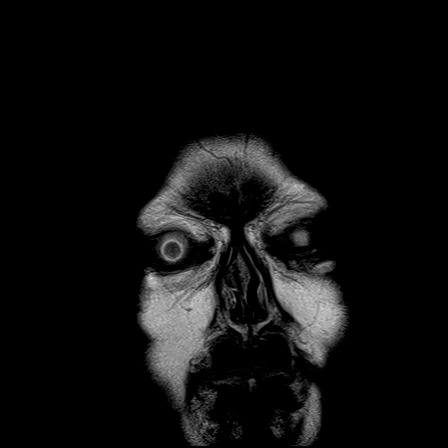

[Series 12: T1 post-contrast · axial · 3.0mm · 1.00mm/px · z∈[-76,+100]mm · 6 of 60 slices shown (1 of 3)]
[im 1/60]
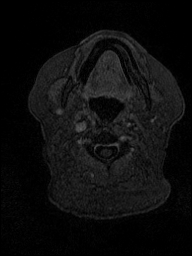
[im 12/60]
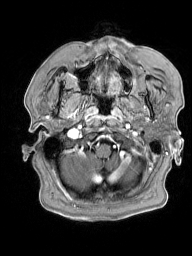
[im 24/60]
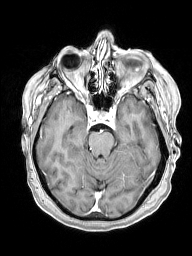
[im 36/60]
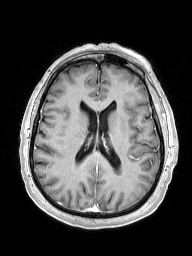
[im 48/60]
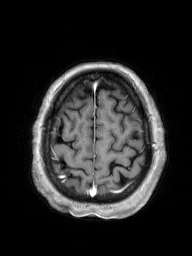
[im 60/60]
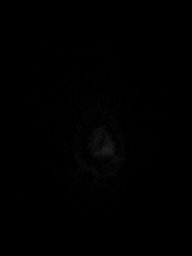

[Series 13: T1 post-contrast · coronal · 5.0mm · 0.43mm/px · 3 of 27 slices shown (2 of 3)]
[im 1/27]
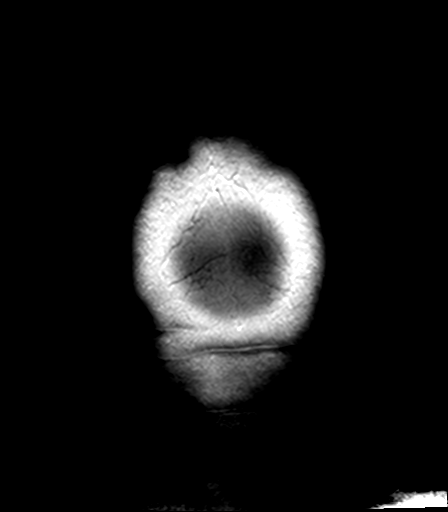
[im 14/27]
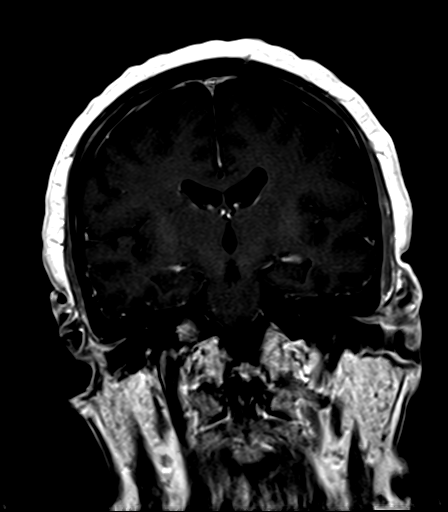
[im 27/27]
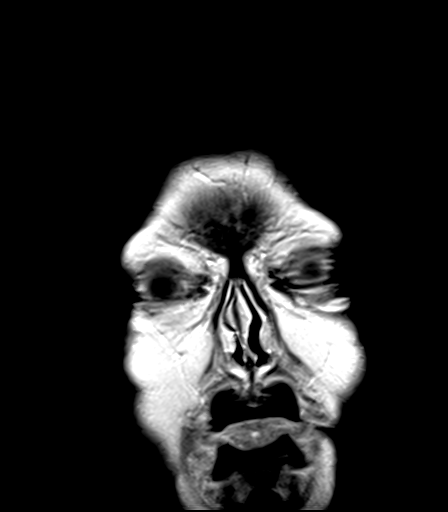

[Series 14: T1 post-contrast · sagittal · 5.0mm · 0.45mm/px · 3 of 27 slices shown (3 of 3)]
[im 1/27]
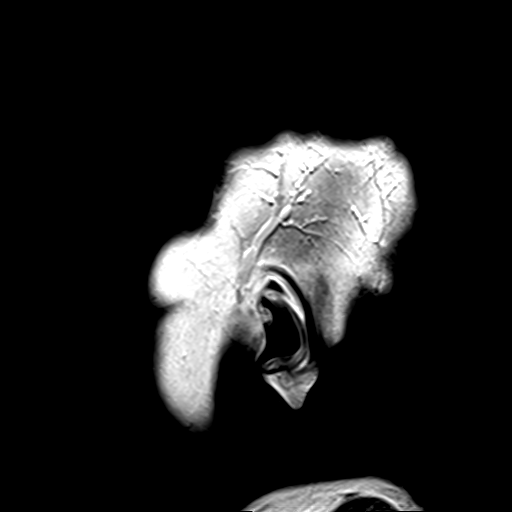
[im 14/27]
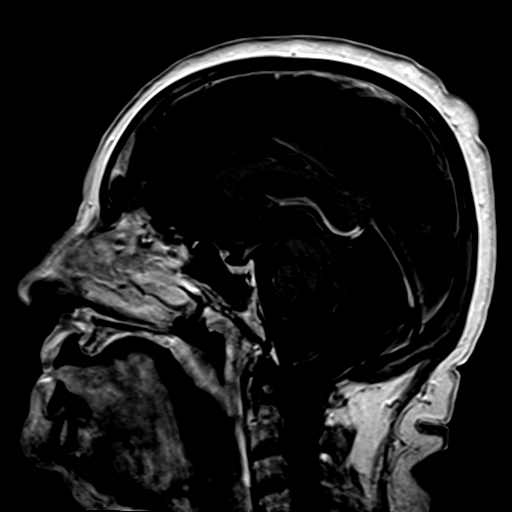
[im 27/27]
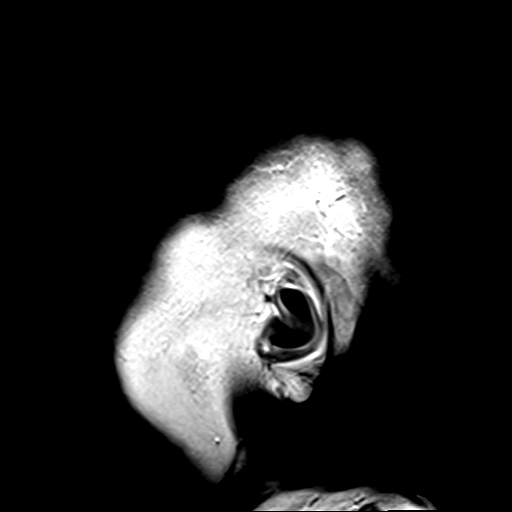

[Series 100: DWI · axial · 3.0mm · 1.80mm/px · z∈[-71,+90]mm · 5 of 55 slices shown (3 of 4)]
[im 1/55]
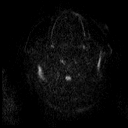
[im 14/55]
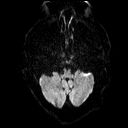
[im 28/55]
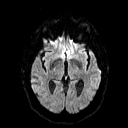
[im 41/55]
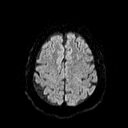
[im 55/55]
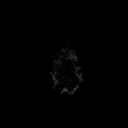

[Series 101: DWI · coronal · 3.0mm · 1.80mm/px · 4 of 45 slices shown (4 of 4)]
[im 1/45]
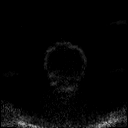
[im 15/45]
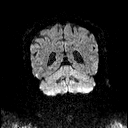
[im 30/45]
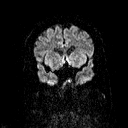
[im 45/45]
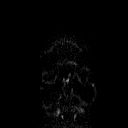

[47 of 48 positions shown; findings below may reference images not displayed]

FINDINGS: No evidence for acute infarction, hemorrhage, mass lesion,
hydrocephalus, or extra-axial fluid. Mild atrophy, not unexpected
for age. Minor white matter disease, not unexpected for age.

Flow voids are maintained throughout the carotid, basilar, and
vertebral arteries. There are no areas of chronic hemorrhage.

Post infusion, no abnormal enhancement of the brain or meninges.

Visualized calvarium, skull base, and upper cervical osseous
structures unremarkable. Scalp and extracranial soft tissues,
orbits, and mastoids show no acute process. Layering fluid both
divisions sphenoid, suggesting acuity. LEFT cataract extraction.
IMPRESSION: No acute intracranial findings are evident.

No abnormal enhancement to suggest metastatic disease.

Suspected acute sphenoid sinusitis.

## 2017-10-12 ENCOUNTER — Other Ambulatory Visit: Payer: Self-pay

## 2017-10-12 ENCOUNTER — Encounter: Payer: Self-pay | Admitting: Physical Therapy

## 2017-10-12 ENCOUNTER — Ambulatory Visit: Payer: Medicare Other | Attending: Family Medicine | Admitting: Physical Therapy

## 2017-10-12 VITALS — BP 92/78 | HR 66

## 2017-10-12 DIAGNOSIS — R2689 Other abnormalities of gait and mobility: Secondary | ICD-10-CM | POA: Diagnosis not present

## 2017-10-12 DIAGNOSIS — M6281 Muscle weakness (generalized): Secondary | ICD-10-CM | POA: Diagnosis not present

## 2017-10-12 DIAGNOSIS — R2681 Unsteadiness on feet: Secondary | ICD-10-CM | POA: Insufficient documentation

## 2017-10-12 NOTE — Patient Instructions (Signed)
Access Code: [email or web address]  URL: [email or web address]  Date: 10/12/2017  Prepared by: Collie Siad   Exercises  Seated March - 15 reps - 2 sets - 2x daily - 7x weekly  Seated Long Arc Quad - 10 reps - 2 sets - 2x daily - 7x weekly

## 2017-10-13 NOTE — Therapy (Signed)
[place] MAIN [facility] SERVICES [place], [place], [address] Phone: [phone]   Fax:  [phone]  Physical Therapy Evaluation  Patient Details  Name: [name] [name] MRN: [id number] Date of Birth: 03/09/1941 Referring Provider: Jerrol Banana, MD   Encounter Date: 10/12/2017  PT End of Session - 10/12/17 1615    Visit Number  1    Number of Visits  17    Date for PT Re-Evaluation  12/07/17    Authorization Type  1/10 progress note (start of reporting period 7/9)    PT Start Time  1518    PT Stop Time  1613    PT Time Calculation (min)  55 min    Equipment Utilized During Treatment  Gait belt    Activity Tolerance  Patient tolerated treatment well;Patient limited by fatigue    Behavior During Therapy  [facility] for tasks assessed/performed       Past Medical History:  Diagnosis Date  . Abnormal CT lung screening 10/17/2015  . COPD (chronic obstructive pulmonary disease) ([place])   . Coronary artery disease, non-occlusive    a. cath 2006: min nonobs CAD; b. cath 12/2010: cath LAD 50%, RCA 60%; c. 08/2013: Minimal luminal irregs, right dominant system with no significant CAD, diffuse luminal irregs noted. Normal EF 55%, no AS or MS.   . Diabetes mellitus   . Hyperlipemia    Followed by Dr. Rosanna Randy  . Hypertension   . Lung cancer ([place])   . Macular degeneration    rt  . Personal history of tobacco use, presenting hazards to health 10/15/2015  . Pneumonia    hx  . Shortness of breath   . Stroke [facility])     Past Surgical History:  Procedure Laterality Date  . ABDOMINAL HYSTERECTOMY    . ANTERIOR CERVICAL DECOMP/DISCECTOMY FUSION N/A 10/19/2013   Procedure: CERVICAL FIVE-SIX ANTERIOR CERVICAL DECOMPRESSION WITH FUSION INTERBODY PROSTHESIS PLATING AND PEEK CAGE;  Surgeon: Ophelia Charter, MD;  Location: [place] NEURO ORS;  Service: Neurosurgery;  Laterality: N/A;  . BACK SURGERY  80's  . BREAST CYST EXCISION Left    left negative   .  CARDIAC CATHETERIZATION  05/2004  . CATARACT EXTRACTION Left   . CHOLECYSTECTOMY    . ENDARTERECTOMY Left 10/24/2014   Procedure: ENDARTERECTOMY CAROTID;  Surgeon: Algernon Huxley, MD;  Location: [facility] ORS;  Service: Vascular;  Laterality: Left;  . ENDOBRONCHIAL ULTRASOUND N/A 11/14/2015   Procedure: ENDOBRONCHIAL ULTRASOUND;  Surgeon: [name], MD;  Location: [facility] ORS;  Service: Cardiopulmonary;  Laterality: N/A;  . PERIPHERAL VASCULAR CATHETERIZATION N/A 12/04/2015   Procedure: Glori Luis Cath Insertion;  Surgeon: Algernon Huxley, MD;  Location: [place] CV LAB;  Service: Cardiovascular;  Laterality: N/A;  . PORTA CATH REMOVAL N/A 05/17/2017   Procedure: PORTA CATH REMOVAL;  Surgeon: Algernon Huxley, MD;  Location: [place] CV LAB;  Service: Cardiovascular;  Laterality: N/A;  . TONSILLECTOMY AND ADENOIDECTOMY    . VESICOVAGINAL FISTULA CLOSURE W/ TAH      Vitals:   10/12/17 1523  BP: 92/78  Pulse: 66  SpO2: 100%     Subjective Assessment - 10/12/17 1523    Subjective  Pt presents with reports of imbalance and BLE weakness    Pertinent History  Pt presents with reports of imbalance and BLE weakness. Pt reports that she had HHPT ~1 year ago and since has become more weak.  Pt reports 6 falls in the past 6 months.  Pt reports she loses her balance due to her RLE giving way, causing her to fall.  Pt with h/o stroke but denies it affecting one side more than the other.  Pt with h/o lung cancer with last treatment ~2 years ago.  Pt reports 8/10 LBP which she attributes to falls in the past.  Pt is able to ascend/descend steps but this is challenging.   Pt has a ramp but she still goes up the steps.  Husband does the driving, grocery shopping.  Pt helps with cooking and cleaning.  Pt independent with showering and dressing.  Pt ambulates with her SPC at all times.  Pt checks her BP, pulse, O2, blood glucose each day.  She also checks the bottom of her feet 1x/wk due to her peripheral neuropathy  (addressed this session to check 1x/day).     How long can you sit comfortably?  no issues    How long can you stand comfortably?  10 minutes    How long can you walk comfortably?  household distances    Patient Stated Goals  Pt would like to be able to walk farther, no falls, up and down steps    Currently in Pain?  Yes    Pain Score  8     Pain Location  Back    Pain Orientation  Lower    Pain Descriptors / Indicators  Aching    Pain Type  Chronic pain         [facility] PT Assessment - 10/12/17 1529      Assessment   Medical Diagnosis  Weakness of both lower extremities    Referring Provider  Jerrol Banana, MD    Onset Date/Surgical Date  10/12/16 Noticed increased weakness beginning ~1 year ago    Hand Dominance  Right    Next MD Visit  End of August with Dr. Rosanna Randy (PCP)    Prior Therapy  Yes, with improvement      Precautions   Precautions  Fall      Restrictions   Weight Bearing Restrictions  No      Balance Screen   Has the patient fallen in the past 6 months  Yes    How many times?  6    Has the patient had a decrease in activity level because of a fear of falling?   Yes    Is the patient reluctant to leave their home because of a fear of falling?   Yes      [place] residence    Living Arrangements  Spouse/significant other husband    Available Help at Discharge  Family    Type of [place] to enter;Ramped entrance    [place] of Steps  6    Entrance Stairs-Rails  Left;Right;Can reach both    [place]  One level    [place] - 4 wheels;Wheelchair - manual;Cane - single point;Walker - 2 wheels;Shower seat      Prior Function   Level of Independence  Independent with household mobility with device    Vocation  Retired    Leisure  Gambling      Cognition   Overall Cognitive Status  Within Functional Limits for tasks assessed      ROM / Strength   AROM /  PROM / Strength  Strength      Strength   Overall  Strength  Deficits    Strength Assessment Site  Hip;Knee;Ankle    Right/Left Hip  Right;Left    Right Hip Flexion  3/5    Right Hip External Rotation   3+/5    Right Hip Internal Rotation  3+/5    Right Hip ABduction  3-/5    Right Hip ADduction  3/5    Left Hip Flexion  3/5    Left Hip External Rotation  3+/5    Left Hip Internal Rotation  3+/5    Left Hip ABduction  3-/5    Left Hip ADduction  3/5    Right/Left Knee  Right;Left    Right Knee Flexion  3-/5    Right Knee Extension  3-/5    Left Knee Flexion  3-/5    Left Knee Extension  3-/5    Right/Left Ankle  Right;Left    Right Ankle Dorsiflexion  5/5    Left Ankle Dorsiflexion  5/5      Balance   Balance Assessed  Yes      Standardized Balance Assessment   Standardized Balance Assessment  Berg Balance Test      Berg Balance Test   Sit to Stand  Able to stand  independently using hands    Standing Unsupported  Able to stand 2 minutes with supervision    Sitting with Back Unsupported but Feet Supported on Floor or Stool  Able to sit safely and securely 2 minutes    Stand to Sit  Controls descent by using hands    Transfers  Able to transfer safely, definite need of hands    Standing Unsupported with Eyes Closed  Needs help to keep from falling    Standing Ubsupported with Feet Together  Able to place feet together independently and stand for 1 minute with supervision    From Standing, Reach Forward with Outstretched Arm  Can reach forward >12 cm safely (5")    From Standing Position, Pick up Object from Floor  Unable to try/needs assist to keep balance    From Standing Position, Turn to Look Behind Over each Shoulder  Needs assist to keep from losing balance and falling    Turn 360 Degrees  Needs close supervision or verbal cueing    Standing Unsupported, Alternately Place Feet on Step/Stool  Needs assistance to keep from falling or unable to try    Standing Unsupported,  One Foot in [employer] balance while stepping or standing    Standing on One Leg  Unable to try or needs assist to prevent fall    Total Score  23       EXAMINATION   47mWT (with SPC): 0.26 m/s   Berg Balance Test: 23/56   TUG (with cane): 32 seconds   ABC Scale: 20%   Sensation: dec sensation to light touch distal to Bil knees   Posture: rounded shoulders and forward head posture, sacral sitting, dec lumbar lordosis   Gait Analysis: dec Bil DF and little to no Bil hip F. Pt shuffles her feet. Dec trunk rotation and arm swing. Pt carries SPC in RUE.         TREATMENT   Pt instructed to check the bottom of her feet each night either having her husband to check or using her full length mirror sitting in a chair, pt verbalized understanding as she had only been doing with 1x/wk.   Seated marching x15 each LE (added to HEP)   LAQ x10 each LE (  added to HEP)           Objective measurements completed on examination: See above findings.              PT Education - 10/12/17 1615    Education Details  POC; role of PT; HEP and handout; examination findings    Person(s) Educated  Patient    Methods  Explanation;Demonstration;Verbal cues;Handout    Comprehension  Verbalized understanding;Returned demonstration;Verbal cues required;Need further instruction       PT Short Term Goals - 10/12/17 1617      PT SHORT TERM GOAL #1   Title  Pt will independently complete HEP at least 4 days/wk for improved carryover between sessions    Time  2    Period  Weeks    Status  New      PT SHORT TERM GOAL #2   Title  Pt will inspect her feet at least 1x/day for proper foot care due to peripheral neuropathy    Baseline  1x/wk    Time  2    Period  Weeks    Status  New        PT Long Term Goals - 10/12/17 1618      PT LONG TERM GOAL #1   Title  Pt's ABC scale will improve to at least 50% to demonstrate decreased perception of imbalance    Baseline  20%     Time  8    Period  Weeks    Status  New      PT LONG TERM GOAL #2   Title  Pt's TUG time (with SPC) will decrease to less than or equal to 20 seconds to demonstrate improved mobility    Baseline  32 seconds    Time  8    Period  Weeks    Status  New      PT LONG TERM GOAL #3   Title  Pt's Berg Balance Test will improve to at least 42/56 to demonstrate decreased risk of falling    Baseline  23/56    Time  8    Period  Weeks    Status  New      PT LONG TERM GOAL #4   Title  Pt's 42mWT (with SPC) will improve to at least 0.6 m/s to demonstrate improved gait speed in the home in community    Baseline  0.26 m/s    Time  6    Period  Weeks    Status  New             Plan - 10/13/17 0900    Clinical Impression Statement  Pt is a 77 y/o F who presents with reports of BLE weakness and falls.  Pt demonstrates significant BLE weakness with MMT.  Pt scored a 23/56 on the [employer], indicating that the pt is at a high risk of falling. Pt scored a 20% on the ABC scale, indicating poor personal perspective regarding her balance.  Pt's 60mWT was 0.26 m/s and will benefit from grait training to transition toward a community ambulator.  Pt's TUG indicates greater risk of falling and weakness as well. Pt will benefit from skilled PT interventions for improved balance, strength, and to decrease risk of falling.     History and Personal Factors relevant to plan of care:  (+) Pt has responded positively to PT in the past (HHPT)  (-) Pt with peripheral neuropathy, several recent falls    Clinical  Presentation  Stable    Clinical Presentation due to:  Pt's presentation is not unpredictable and has had a gradual onset     Clinical Decision Making  Low    Rehab Potential  Good    PT Frequency  2x / week    PT Duration  8 weeks    PT Treatment/Interventions  ADLs/Self Care Home Management;Aquatic Therapy;Cryotherapy;Electrical Stimulation;Iontophoresis 4mg /ml Dexamethasone;Moist  Heat;Traction;Ultrasound;DME Instruction;Gait training;Stair training;Functional mobility training;Therapeutic activities;Therapeutic exercise;Balance training;Neuromuscular re-education;Patient/family education;Orthotic Fit/Training;Wheelchair mobility training;Manual techniques;Compression bandaging;Passive range of motion;Dry needling;Energy conservation;Taping    PT Next Visit Plan  Progress HEP and continue with strength and balance training, gait training    PT Home Exercise Plan  seated marching, LAQ    Recommended Other Services  none at this time    Consulted and Agree with Plan of Care  Patient       Patient will benefit from skilled therapeutic intervention in order to improve the following deficits and impairments:  Abnormal gait, Decreased activity tolerance, Decreased balance, Decreased endurance, Decreased knowledge of use of DME, Decreased mobility, Decreased range of motion, Decreased safety awareness, Decreased strength, Difficulty walking, Hypomobility, Increased fascial restricitons, Impaired perceived functional ability, Impaired flexibility, Increased muscle spasms, Impaired sensation, Improper body mechanics, Postural dysfunction, Pain  Visit Diagnosis: Unsteadiness on feet  Muscle weakness (generalized)  Other abnormalities of gait and mobility     Problem List Patient Active Problem List   Diagnosis Date Noted  . Malnutrition of moderate degree 03/31/2017  . Acute respiratory failure with hypoxia ([place]) 03/30/2017  . AKI (acute kidney injury) ([place]) 07/15/2016  . Protein-calorie malnutrition, severe 02/08/2016  . Primary cancer of right upper lobe of lung ([place]) 11/20/2015  . Abnormal CT lung screening 10/17/2015  . Personal history of tobacco use, presenting hazards to health 10/15/2015  . Chronic vulvitis 09/26/2014  . Allergic reaction 09/26/2014  . Carotid stenosis 08/30/2014  . Cervical nerve root disorder 08/10/2014  . CAD in native artery 08/10/2014  .  B12 deficiency 08/10/2014  . Back ache 08/10/2014  . Bronchitis, chronic ([place]) 08/10/2014  . Diabetes mellitus with polyneuropathy ([place]) 08/10/2014  . Can't get food down 08/10/2014  . Eczema of external ear 08/10/2014  . Accumulation of fluid in tissues 08/10/2014  . Gout 08/10/2014  . Adult hypothyroidism 08/10/2014  . Mononeuritis 08/10/2014  . Muscle ache 08/10/2014  . Disorder of peripheral nervous system 08/10/2014  . Lesion of vulva 08/10/2014  . Cervical spondylosis with radiculopathy 10/19/2013  . COPD exacerbation ([place]) 03/29/2013  . CAD (coronary artery disease) 06/22/2011  . COPD (chronic obstructive pulmonary disease) with emphysema ([place]) 03/07/2010  . CHEST PAIN UNSPECIFIED 07/22/2009  . HLD (hyperlipidemia) 04/01/2009  . Malaise and fatigue 04/01/2009  . Hyperlipidemia 01/18/2009  . TOBACCO ABUSE 01/18/2009  . HYPERTENSION, BENIGN 01/18/2009  . CLAUDICATION 01/18/2009  . Pain in limb 01/18/2009  . CAFL (chronic airflow limitation) ([place]) 01/20/2007  . Late effects of cerebrovascular disease 01/10/2007  . Essential (primary) hypertension 12/22/2006    Collie Siad PT, DPT 10/13/2017, 9:09 AM  [place] MAIN [facility] SERVICES [address] [place], [place], [address] Phone: [phone]   Fax:  [phone]  Name: [name] [name] MRN: [id number] Date of Birth: [birth year]

## 2017-10-14 ENCOUNTER — Ambulatory Visit: Payer: Medicare Other | Admitting: Physical Therapy

## 2017-10-14 ENCOUNTER — Encounter: Payer: Self-pay | Admitting: Physical Therapy

## 2017-10-14 DIAGNOSIS — R2689 Other abnormalities of gait and mobility: Secondary | ICD-10-CM | POA: Diagnosis not present

## 2017-10-14 DIAGNOSIS — M6281 Muscle weakness (generalized): Secondary | ICD-10-CM | POA: Diagnosis not present

## 2017-10-14 DIAGNOSIS — R2681 Unsteadiness on feet: Secondary | ICD-10-CM

## 2017-10-14 NOTE — Therapy (Signed)
[place] MAIN [facility] SERVICES [address] [place], [place], [address] Phone: [phone]   Fax:  [phone]  Physical Therapy Treatment  Patient Details  Name: [name] [name] MRN: [id number] Date of Birth: Jul 27, 1940 Referring Provider: Jerrol Banana, MD   Encounter Date: 10/14/2017  PT End of Session - 10/14/17 1359    Visit Number  2    Number of Visits  17    Date for PT Re-Evaluation  12/07/17    Authorization Type  2/10 progress note (start of reporting period 7/9)    PT Start Time  1352    PT Stop Time  1430    PT Time Calculation (min)  38 min    Equipment Utilized During Treatment  Gait belt    Activity Tolerance  Patient tolerated treatment well;Patient limited by fatigue    Behavior During Therapy  [facility] for tasks assessed/performed       Past Medical History:  Diagnosis Date  . Abnormal CT lung screening 10/17/2015  . COPD (chronic obstructive pulmonary disease) ([place])   . Coronary artery disease, non-occlusive    a. cath 2006: min nonobs CAD; b. cath 12/2010: cath LAD 50%, RCA 60%; c. 08/2013: Minimal luminal irregs, right dominant system with no significant CAD, diffuse luminal irregs noted. Normal EF 55%, no AS or MS.   . Diabetes mellitus   . Hyperlipemia    Followed by Dr. Rosanna Randy  . Hypertension   . Lung cancer ([place])   . Macular degeneration    rt  . Personal history of tobacco use, presenting hazards to health 10/15/2015  . Pneumonia    hx  . Shortness of breath   . Stroke [facility])     Past Surgical History:  Procedure Laterality Date  . ABDOMINAL HYSTERECTOMY    . ANTERIOR CERVICAL DECOMP/DISCECTOMY FUSION N/A 10/19/2013   Procedure: CERVICAL FIVE-SIX ANTERIOR CERVICAL DECOMPRESSION WITH FUSION INTERBODY PROSTHESIS PLATING AND PEEK CAGE;  Surgeon: Ophelia Charter, MD;  Location: [place] NEURO ORS;  Service: Neurosurgery;  Laterality: N/A;  . BACK SURGERY  80's  . BREAST CYST EXCISION Left    left negative   .  CARDIAC CATHETERIZATION  05/2004  . CATARACT EXTRACTION Left   . CHOLECYSTECTOMY    . ENDARTERECTOMY Left 10/24/2014   Procedure: ENDARTERECTOMY CAROTID;  Surgeon: Algernon Huxley, MD;  Location: [facility] ORS;  Service: Vascular;  Laterality: Left;  . ENDOBRONCHIAL ULTRASOUND N/A 11/14/2015   Procedure: ENDOBRONCHIAL ULTRASOUND;  Surgeon: [name], MD;  Location: [facility] ORS;  Service: Cardiopulmonary;  Laterality: N/A;  . PERIPHERAL VASCULAR CATHETERIZATION N/A 12/04/2015   Procedure: Glori Luis Cath Insertion;  Surgeon: Algernon Huxley, MD;  Location: [place] CV LAB;  Service: Cardiovascular;  Laterality: N/A;  . PORTA CATH REMOVAL N/A 05/17/2017   Procedure: PORTA CATH REMOVAL;  Surgeon: Algernon Huxley, MD;  Location: [place] CV LAB;  Service: Cardiovascular;  Laterality: N/A;  . TONSILLECTOMY AND ADENOIDECTOMY    . VESICOVAGINAL FISTULA CLOSURE W/ TAH      There were no vitals filed for this visit.  Subjective Assessment - 10/14/17 1358    Subjective  Pt reports she is sore from her eval and completing her HEP.  Her husband has been checking her feet every night.  No pain currently.  No new complaints or concerns.     Pertinent History  Pt presents with reports of imbalance and BLE weakness. Pt reports that she had HHPT ~1 year ago and  since has become more weak.  Pt reports 6 falls in the past 6 months.  Pt reports she loses her balance due to her RLE giving way, causing her to fall.  Pt with h/o stroke but denies it affecting one side more than the other.  Pt with h/o lung cancer with last treatment ~2 years ago.  Pt reports 8/10 LBP which she attributes to falls in the past.  Pt is able to ascend/descend steps but this is challenging.   Pt has a ramp but she still goes up the steps.  Husband does the driving, grocery shopping.  Pt helps with cooking and cleaning.  Pt independent with showering and dressing.  Pt ambulates with her SPC at all times.  Pt checks her BP, pulse, O2, blood glucose each day.   She also checks the bottom of her feet 1x/wk due to her peripheral neuropathy (addressed this session to check 1x/day).     How long can you sit comfortably?  no issues    How long can you stand comfortably?  10 minutes    How long can you walk comfortably?  household distances    Patient Stated Goals  Pt would like to be able to walk farther, no falls, up and down steps    Currently in Pain?  No/denies          TREATMENT   Seated marching x15 each LE  LAQ x10 each LE  Bridges with cues for glute squeeze for improve glute recruitment. 2x10  SLR x10 each LE  Sit<>stand without UE assist 2x5 from chair with pillow in the seat (added to HEP)  Walking backward with SPC and occasional min assist due to unsteadiness 2x25 ft  Height of SPC adjusted as initially was too high. Pt demonstrates improved posture and stability ambulating with new height.  Tandem walking with SPC and min assist due to imbalance x15 ft  Romberg stance on firm surface with eyes closed  Marching in standing with 1UE support x20 each LE                        PT Education - 10/14/17 1359    Education Details  Exercise technique    Person(s) Educated  Patient    Methods  Explanation;Demonstration;Verbal cues    Comprehension  Verbalized understanding;Returned demonstration;Verbal cues required;Need further instruction       PT Short Term Goals - 10/12/17 1617      PT SHORT TERM GOAL #1   Title  Pt will independently complete HEP at least 4 days/wk for improved carryover between sessions    Time  2    Period  Weeks    Status  New      PT SHORT TERM GOAL #2   Title  Pt will inspect her feet at least 1x/day for proper foot care due to peripheral neuropathy    Baseline  1x/wk    Time  2    Period  Weeks    Status  New        PT Long Term Goals - 10/12/17 1618      PT LONG TERM GOAL #1   Title  Pt's ABC scale will improve to at least 50% to demonstrate decreased perception of  imbalance    Baseline  20%    Time  8    Period  Weeks    Status  New      PT LONG TERM GOAL #2  Title  Pt's TUG time (with SPC) will decrease to less than or equal to 20 seconds to demonstrate improved mobility    Baseline  32 seconds    Time  8    Period  Weeks    Status  New      PT LONG TERM GOAL #3   Title  Pt's Berg Balance Test will improve to at least 42/56 to demonstrate decreased risk of falling    Baseline  23/56    Time  8    Period  Weeks    Status  New      PT LONG TERM GOAL #4   Title  Pt's 66mWT (with SPC) will improve to at least 0.6 m/s to demonstrate improved gait speed in the home in community    Baseline  0.26 m/s    Time  6    Period  Weeks    Status  New            Plan - 10/14/17 1404    Clinical Impression Statement  Pt reports being sore after evaluation and initiation of her HEP. Pt fatigues quickly with all strengthening exercises and requires rest breaks between each exercise.  Height of SPC adjusted as initially was too high. Pt demonstrates improved posture and stability ambulating with new height.  Pt will benefit from continued skilled PT interventions for improved strength and balance.      Rehab Potential  Good    PT Frequency  2x / week    PT Duration  8 weeks    PT Treatment/Interventions  ADLs/Self Care Home Management;Aquatic Therapy;Cryotherapy;Electrical Stimulation;Iontophoresis 4mg /ml Dexamethasone;Moist Heat;Traction;Ultrasound;DME Instruction;Gait training;Stair training;Functional mobility training;Therapeutic activities;Therapeutic exercise;Balance training;Neuromuscular re-education;Patient/family education;Orthotic Fit/Training;Wheelchair mobility training;Manual techniques;Compression bandaging;Passive range of motion;Dry needling;Energy conservation;Taping    PT Next Visit Plan  Progress HEP and continue with strength and balance training, gait training    PT Home Exercise Plan  seated marching, LAQ    Consulted and Agree  with Plan of Care  Patient       Patient will benefit from skilled therapeutic intervention in order to improve the following deficits and impairments:  Abnormal gait, Decreased activity tolerance, Decreased balance, Decreased endurance, Decreased knowledge of use of DME, Decreased mobility, Decreased range of motion, Decreased safety awareness, Decreased strength, Difficulty walking, Hypomobility, Increased fascial restricitons, Impaired perceived functional ability, Impaired flexibility, Increased muscle spasms, Impaired sensation, Improper body mechanics, Postural dysfunction, Pain  Visit Diagnosis: Unsteadiness on feet  Muscle weakness (generalized)  Other abnormalities of gait and mobility     Problem List Patient Active Problem List   Diagnosis Date Noted  . Malnutrition of moderate degree 03/31/2017  . Acute respiratory failure with hypoxia ([place]) 03/30/2017  . AKI (acute kidney injury) ([place]) 07/15/2016  . Protein-calorie malnutrition, severe 02/08/2016  . Primary cancer of right upper lobe of lung ([place]) 11/20/2015  . Abnormal CT lung screening 10/17/2015  . Personal history of tobacco use, presenting hazards to health 10/15/2015  . Chronic vulvitis 09/26/2014  . Allergic reaction 09/26/2014  . Carotid stenosis 08/30/2014  . Cervical nerve root disorder 08/10/2014  . CAD in native artery 08/10/2014  . B12 deficiency 08/10/2014  . Back ache 08/10/2014  . Bronchitis, chronic ([place]) 08/10/2014  . Diabetes mellitus with polyneuropathy ([place]) 08/10/2014  . Can't get food down 08/10/2014  . Eczema of external ear 08/10/2014  . Accumulation of fluid in tissues 08/10/2014  . Gout 08/10/2014  . Adult hypothyroidism 08/10/2014  . Mononeuritis 08/10/2014  .  Muscle ache 08/10/2014  . Disorder of peripheral nervous system 08/10/2014  . Lesion of vulva 08/10/2014  . Cervical spondylosis with radiculopathy 10/19/2013  . COPD exacerbation ([place]) 03/29/2013  . CAD (coronary artery  disease) 06/22/2011  . COPD (chronic obstructive pulmonary disease) with emphysema ([place]) 03/07/2010  . CHEST PAIN UNSPECIFIED 07/22/2009  . HLD (hyperlipidemia) 04/01/2009  . Malaise and fatigue 04/01/2009  . Hyperlipidemia 01/18/2009  . TOBACCO ABUSE 01/18/2009  . HYPERTENSION, BENIGN 01/18/2009  . CLAUDICATION 01/18/2009  . Pain in limb 01/18/2009  . CAFL (chronic airflow limitation) ([place]) 01/20/2007  . Late effects of cerebrovascular disease 01/10/2007  . Essential (primary) hypertension 12/22/2006     Collie Siad PT, DPT 10/14/2017, 2:27 PM  [place] MAIN [facility] SERVICES [address] [place], [place], [address] Phone: [phone]   Fax:  [phone]  Name: [name] [name] MRN: [id number] Date of Birth: 1940-04-26

## 2017-10-18 ENCOUNTER — Ambulatory Visit: Payer: Medicare Other | Admitting: Physical Therapy

## 2017-10-18 ENCOUNTER — Encounter: Payer: Self-pay | Admitting: Physical Therapy

## 2017-10-18 DIAGNOSIS — M6281 Muscle weakness (generalized): Secondary | ICD-10-CM | POA: Diagnosis not present

## 2017-10-18 DIAGNOSIS — R2689 Other abnormalities of gait and mobility: Secondary | ICD-10-CM

## 2017-10-18 DIAGNOSIS — R2681 Unsteadiness on feet: Secondary | ICD-10-CM | POA: Diagnosis not present

## 2017-10-18 NOTE — Therapy (Signed)
[place] MAIN [facility] SERVICES [address] [place], [place], [address] Phone: [phone]   Fax:  [phone]  Physical Therapy Treatment  Patient Details  Name: [name] [name] MRN: [id number] Date of Birth: 02/17/1941 Referring Provider: Jerrol Banana, MD   Encounter Date: 10/18/2017  PT End of Session - 10/18/17 1322    Visit Number  3    Number of Visits  17    Date for PT Re-Evaluation  12/07/17    Authorization Type  3/10 progress note (start of reporting period 7/9)    PT Start Time  0107    PT Stop Time  0146    PT Time Calculation (min)  39 min    Equipment Utilized During Treatment  Gait belt    Activity Tolerance  Patient tolerated treatment well;Patient limited by fatigue    Behavior During Therapy  [facility] for tasks assessed/performed       Past Medical History:  Diagnosis Date  . Abnormal CT lung screening 10/17/2015  . COPD (chronic obstructive pulmonary disease) ([place])   . Coronary artery disease, non-occlusive    a. cath 2006: min nonobs CAD; b. cath 12/2010: cath LAD 50%, RCA 60%; c. 08/2013: Minimal luminal irregs, right dominant system with no significant CAD, diffuse luminal irregs noted. Normal EF 55%, no AS or MS.   . Diabetes mellitus   . Hyperlipemia    Followed by Dr. Rosanna Randy  . Hypertension   . Lung cancer ([place])   . Macular degeneration    rt  . Personal history of tobacco use, presenting hazards to health 10/15/2015  . Pneumonia    hx  . Shortness of breath   . Stroke [facility])     Past Surgical History:  Procedure Laterality Date  . ABDOMINAL HYSTERECTOMY    . ANTERIOR CERVICAL DECOMP/DISCECTOMY FUSION N/A 10/19/2013   Procedure: CERVICAL FIVE-SIX ANTERIOR CERVICAL DECOMPRESSION WITH FUSION INTERBODY PROSTHESIS PLATING AND PEEK CAGE;  Surgeon: Ophelia Charter, MD;  Location: [place] NEURO ORS;  Service: Neurosurgery;  Laterality: N/A;  . BACK SURGERY  80's  . BREAST CYST EXCISION Left    left negative   .  CARDIAC CATHETERIZATION  05/2004  . CATARACT EXTRACTION Left   . CHOLECYSTECTOMY    . ENDARTERECTOMY Left 10/24/2014   Procedure: ENDARTERECTOMY CAROTID;  Surgeon: Algernon Huxley, MD;  Location: [facility] ORS;  Service: Vascular;  Laterality: Left;  . ENDOBRONCHIAL ULTRASOUND N/A 11/14/2015   Procedure: ENDOBRONCHIAL ULTRASOUND;  Surgeon: [name], MD;  Location: [facility] ORS;  Service: Cardiopulmonary;  Laterality: N/A;  . PERIPHERAL VASCULAR CATHETERIZATION N/A 12/04/2015   Procedure: Glori Luis Cath Insertion;  Surgeon: Algernon Huxley, MD;  Location: [place] CV LAB;  Service: Cardiovascular;  Laterality: N/A;  . PORTA CATH REMOVAL N/A 05/17/2017   Procedure: PORTA CATH REMOVAL;  Surgeon: Algernon Huxley, MD;  Location: [place] CV LAB;  Service: Cardiovascular;  Laterality: N/A;  . TONSILLECTOMY AND ADENOIDECTOMY    . VESICOVAGINAL FISTULA CLOSURE W/ TAH      There were no vitals filed for this visit.  Subjective Assessment - 10/18/17 1320    Subjective  Pt reports she is sore from her eval and completing her HEP. Reports back pain 8/10.  No new complaints or concerns.     Pertinent History  Pt presents with reports of imbalance and BLE weakness. Pt reports that she had HHPT ~1 year ago and since has become more weak.  Pt reports 6 falls  in the past 6 months.  Pt reports she loses her balance due to her RLE giving way, causing her to fall.  Pt with h/o stroke but denies it affecting one side more than the other.  Pt with h/o lung cancer with last treatment ~2 years ago.  Pt reports 8/10 LBP which she attributes to falls in the past.  Pt is able to ascend/descend steps but this is challenging.   Pt has a ramp but she still goes up the steps.  Husband does the driving, grocery shopping.  Pt helps with cooking and cleaning.  Pt independent with showering and dressing.  Pt ambulates with her SPC at all times.  Pt checks her BP, pulse, O2, blood glucose each day.  She also checks the bottom of her feet 1x/wk due  to her peripheral neuropathy (addressed this session to check 1x/day).     How long can you sit comfortably?  no issues    How long can you stand comfortably?  10 minutes    How long can you walk comfortably?  household distances    Patient Stated Goals  Pt would like to be able to walk farther, no falls, up and down steps    Pain Score  8     Pain Location  Back    Pain Orientation  Lower    Pain Descriptors / Indicators  Aching    Pain Type  Chronic pain    Multiple Pain Sites  No         Therapeutic exercise:  Supine hip abd/add x 15 x 2 BLE Supine SLR x 10 x 2 BLE hooklying hip abd/ER x 20 x 2 Bridging x 10 x 2 Trunk rotation left and right x 20 Seated knee flex with RTB/ marching in sitting/ LAQ x 10 x 2 Sit to stand with RW x 10 x 2. Standing hip abd x 15 BLE Standing hip ext x 15 BLE Heel raises x 15 x 2  Standing on level surface with feet together and head turns x 15   Patient needs cues for correct technique and posture correction.                         PT Short Term Goals - 10/12/17 1617      PT SHORT TERM GOAL #1   Title  Pt will independently complete HEP at least 4 days/wk for improved carryover between sessions    Time  2    Period  Weeks    Status  New      PT SHORT TERM GOAL #2   Title  Pt will inspect her feet at least 1x/day for proper foot care due to peripheral neuropathy    Baseline  1x/wk    Time  2    Period  Weeks    Status  New        PT Long Term Goals - 10/12/17 1618      PT LONG TERM GOAL #1   Title  Pt's ABC scale will improve to at least 50% to demonstrate decreased perception of imbalance    Baseline  20%    Time  8    Period  Weeks    Status  New      PT LONG TERM GOAL #2   Title  Pt's TUG time (with SPC) will decrease to less than or equal to 20 seconds to demonstrate improved mobility    Baseline  32 seconds    Time  8    Period  Weeks    Status  New      PT LONG TERM GOAL #3   Title  Pt's  Berg Balance Test will improve to at least 42/56 to demonstrate decreased risk of falling    Baseline  23/56    Time  8    Period  Weeks    Status  New      PT LONG TERM GOAL #4   Title  Pt's 63mWT (with SPC) will improve to at least 0.6 m/s to demonstrate improved gait speed in the home in community    Baseline  0.26 m/s    Time  6    Period  Weeks    Status  New            Plan - 10/18/17 1323    Clinical Impression Statement  Pt presents with flexed posture and decreased standing tolerance and LE weakness and requires frequent verbal cues for upright posture and education provided on relationship between posture and back pain. Patient demonstrates instability with therapeutic exercises and balance exercises but tolerates all interventions well. Patient will benefit from continued skilled PT interventions for improved balance, posture, strength, and QOL.    Rehab Potential  Good    PT Frequency  2x / week    PT Duration  8 weeks    PT Treatment/Interventions  ADLs/Self Care Home Management;Aquatic Therapy;Cryotherapy;Electrical Stimulation;Iontophoresis 4mg /ml Dexamethasone;Moist Heat;Traction;Ultrasound;DME Instruction;Gait training;Stair training;Functional mobility training;Therapeutic activities;Therapeutic exercise;Balance training;Neuromuscular re-education;Patient/family education;Orthotic Fit/Training;Wheelchair mobility training;Manual techniques;Compression bandaging;Passive range of motion;Dry needling;Energy conservation;Taping    PT Next Visit Plan  Progress HEP and continue with strength and balance training, gait training    PT Home Exercise Plan  seated marching, LAQ    Consulted and Agree with Plan of Care  Patient       Patient will benefit from skilled therapeutic intervention in order to improve the following deficits and impairments:  Abnormal gait, Decreased activity tolerance, Decreased balance, Decreased endurance, Decreased knowledge of use of DME, Decreased  mobility, Decreased range of motion, Decreased safety awareness, Decreased strength, Difficulty walking, Hypomobility, Increased fascial restricitons, Impaired perceived functional ability, Impaired flexibility, Increased muscle spasms, Impaired sensation, Improper body mechanics, Postural dysfunction, Pain  Visit Diagnosis: Unsteadiness on feet  Muscle weakness (generalized)  Other abnormalities of gait and mobility     Problem List Patient Active Problem List   Diagnosis Date Noted  . Malnutrition of moderate degree 03/31/2017  . Acute respiratory failure with hypoxia ([place]) 03/30/2017  . AKI (acute kidney injury) ([place]) 07/15/2016  . Protein-calorie malnutrition, severe 02/08/2016  . Primary cancer of right upper lobe of lung ([place]) 11/20/2015  . Abnormal CT lung screening 10/17/2015  . Personal history of tobacco use, presenting hazards to health 10/15/2015  . Chronic vulvitis 09/26/2014  . Allergic reaction 09/26/2014  . Carotid stenosis 08/30/2014  . Cervical nerve root disorder 08/10/2014  . CAD in native artery 08/10/2014  . B12 deficiency 08/10/2014  . Back ache 08/10/2014  . Bronchitis, chronic ([place]) 08/10/2014  . Diabetes mellitus with polyneuropathy ([place]) 08/10/2014  . Can't get food down 08/10/2014  . Eczema of external ear 08/10/2014  . Accumulation of fluid in tissues 08/10/2014  . Gout 08/10/2014  . Adult hypothyroidism 08/10/2014  . Mononeuritis 08/10/2014  . Muscle ache 08/10/2014  . Disorder of peripheral nervous system 08/10/2014  . Lesion of vulva 08/10/2014  . Cervical spondylosis with radiculopathy 10/19/2013  . COPD  exacerbation ([place]) 03/29/2013  . CAD (coronary artery disease) 06/22/2011  . COPD (chronic obstructive pulmonary disease) with emphysema ([place]) 03/07/2010  . CHEST PAIN UNSPECIFIED 07/22/2009  . HLD (hyperlipidemia) 04/01/2009  . Malaise and fatigue 04/01/2009  . Hyperlipidemia 01/18/2009  . TOBACCO ABUSE 01/18/2009  . HYPERTENSION,  BENIGN 01/18/2009  . CLAUDICATION 01/18/2009  . Pain in limb 01/18/2009  . CAFL (chronic airflow limitation) ([place]) 01/20/2007  . Late effects of cerebrovascular disease 01/10/2007  . Essential (primary) hypertension 12/22/2006    Alanson Puls, [place] DPT 10/18/2017, 1:31 PM  [place] MAIN [facility] SERVICES [address] [place], [place], [address] Phone: [phone]   Fax:  [phone]  Name: [name] [name] MRN: [id number] Date of Birth: 09/12/1940

## 2017-10-19 ENCOUNTER — Ambulatory Visit: Payer: Self-pay

## 2017-10-21 ENCOUNTER — Ambulatory Visit: Payer: Medicare Other | Admitting: Physical Therapy

## 2017-10-21 ENCOUNTER — Ambulatory Visit (INDEPENDENT_AMBULATORY_CARE_PROVIDER_SITE_OTHER): Payer: Medicare Other | Admitting: Family Medicine

## 2017-10-21 VITALS — BP 138/74 | HR 72 | Temp 97.4°F | Resp 16 | Wt 149.0 lb

## 2017-10-21 DIAGNOSIS — E440 Moderate protein-calorie malnutrition: Secondary | ICD-10-CM | POA: Diagnosis not present

## 2017-10-21 DIAGNOSIS — R2681 Unsteadiness on feet: Secondary | ICD-10-CM

## 2017-10-21 DIAGNOSIS — E1342 Other specified diabetes mellitus with diabetic polyneuropathy: Secondary | ICD-10-CM | POA: Diagnosis not present

## 2017-10-21 DIAGNOSIS — J432 Centrilobular emphysema: Secondary | ICD-10-CM

## 2017-10-21 DIAGNOSIS — R2689 Other abnormalities of gait and mobility: Secondary | ICD-10-CM

## 2017-10-21 DIAGNOSIS — M6281 Muscle weakness (generalized): Secondary | ICD-10-CM

## 2017-10-21 DIAGNOSIS — M79604 Pain in right leg: Secondary | ICD-10-CM | POA: Diagnosis not present

## 2017-10-21 DIAGNOSIS — M79605 Pain in left leg: Secondary | ICD-10-CM

## 2017-10-21 DIAGNOSIS — I2510 Atherosclerotic heart disease of native coronary artery without angina pectoris: Secondary | ICD-10-CM | POA: Diagnosis not present

## 2017-10-21 NOTE — Therapy (Addendum)
[place] MAIN [facility] SERVICES [address] [place], [place], [address] Phone: [phone]   Fax:  [phone]  Physical Therapy Treatment  Patient Details  Name: [name] [name] MRN: [id number] Date of Birth: Jun 13, 1940 Referring Provider: Jerrol Banana, MD   Encounter Date: 10/21/2017    PT End of Session - 10/26/17 1628    Visit Number  4    Number of Visits  17    Date for PT Re-Evaluation  12/07/17    Authorization Type  4/10 progress note (start of reporting period 7/9)    PT Start Time  0316    PT Stop Time  0358    PT Time Calculation (min)  42 min    Equipment Utilized During Treatment  Gait belt    Activity Tolerance  Patient tolerated treatment well;Patient limited by fatigue    Behavior During Therapy  [facility] for tasks assessed/performed        Past Medical History:  Diagnosis Date  . Abnormal CT lung screening 10/17/2015  . COPD (chronic obstructive pulmonary disease) ([place])   . Coronary artery disease, non-occlusive    a. cath 2006: min nonobs CAD; b. cath 12/2010: cath LAD 50%, RCA 60%; c. 08/2013: Minimal luminal irregs, right dominant system with no significant CAD, diffuse luminal irregs noted. Normal EF 55%, no AS or MS.   . Diabetes mellitus   . Hyperlipemia    Followed by Dr. Rosanna Randy  . Hypertension   . Lung cancer ([place])   . Macular degeneration    rt  . Personal history of tobacco use, presenting hazards to health 10/15/2015  . Pneumonia    hx  . Shortness of breath   . Stroke [facility])     Past Surgical History:  Procedure Laterality Date  . ABDOMINAL HYSTERECTOMY    . ANTERIOR CERVICAL DECOMP/DISCECTOMY FUSION N/A 10/19/2013   Procedure: CERVICAL FIVE-SIX ANTERIOR CERVICAL DECOMPRESSION WITH FUSION INTERBODY PROSTHESIS PLATING AND PEEK CAGE;  Surgeon: Ophelia Charter, MD;  Location: [facility];  Service: Neurosurgery;  Laterality: N/A;  . BACK SURGERY  80's  . BREAST CYST EXCISION Left    left negative    . CARDIAC CATHETERIZATION  05/2004  . CATARACT EXTRACTION Left   . CHOLECYSTECTOMY    . ENDARTERECTOMY Left 10/24/2014   Procedure: ENDARTERECTOMY CAROTID;  Surgeon: Algernon Huxley, MD;  Location: [facility] ORS;  Service: Vascular;  Laterality: Left;  . ENDOBRONCHIAL ULTRASOUND N/A 11/14/2015   Procedure: ENDOBRONCHIAL ULTRASOUND;  Surgeon: [name], MD;  Location: [facility] ORS;  Service: Cardiopulmonary;  Laterality: N/A;  . PERIPHERAL VASCULAR CATHETERIZATION N/A 12/04/2015   Procedure: Glori Luis Cath Insertion;  Surgeon: Algernon Huxley, MD;  Location: [place] CV LAB;  Service: Cardiovascular;  Laterality: N/A;  . PORTA CATH REMOVAL N/A 05/17/2017   Procedure: PORTA CATH REMOVAL;  Surgeon: Algernon Huxley, MD;  Location: [place] CV LAB;  Service: Cardiovascular;  Laterality: N/A;  . TONSILLECTOMY AND ADENOIDECTOMY    . VESICOVAGINAL FISTULA CLOSURE W/ TAH      There were no vitals filed for this visit.    Therapeutic exercise and neuromuscular training: Nu-step with UE and LE and 5 mins Foam  and balance with head turns left and right feet apart and feet together,cues for better posture Side stepping on blue balance beam left and right x 10 lengths, cues for going slowly and she needs UE support with LOB backwards Tandem standing on 1/2 foam  , cues  for better posture step ups from floor to 6 inch stool x 20 bilateral marching in parallel bars x 20, cues for bigger steps Stepping over bolster left and right and fwd/bwd, needs UE support with LOB backwards Gait training with spc and head turns in hallway 200 feet x 4  CGA and Min to mod verbal cues used throughout with increased in postural sway and LOB most seen with narrow base of support and while on uneven surfaces. Continues to have balance deficits typical with diagnosis. Patient performs intermediate level exercises without pain behaviors and needs verbal cuing for postural alignment and head  positioning                     PT Short Term Goals - 10/12/17 1617      PT SHORT TERM GOAL #1   Title  Pt will independently complete HEP at least 4 days/wk for improved carryover between sessions    Time  2    Period  Weeks    Status  New      PT SHORT TERM GOAL #2   Title  Pt will inspect her feet at least 1x/day for proper foot care due to peripheral neuropathy    Baseline  1x/wk    Time  2    Period  Weeks    Status  New        PT Long Term Goals - 10/12/17 1618      PT LONG TERM GOAL #1   Title  Pt's ABC scale will improve to at least 50% to demonstrate decreased perception of imbalance    Baseline  20%    Time  8    Period  Weeks    Status  New      PT LONG TERM GOAL #2   Title  Pt's TUG time (with SPC) will decrease to less than or equal to 20 seconds to demonstrate improved mobility    Baseline  32 seconds    Time  8    Period  Weeks    Status  New      PT LONG TERM GOAL #3   Title  Pt's Berg Balance Test will improve to at least 42/56 to demonstrate decreased risk of falling    Baseline  23/56    Time  8    Period  Weeks    Status  New      PT LONG TERM GOAL #4   Title  Pt's 34mWT (with SPC) will improve to at least 0.6 m/s to demonstrate improved gait speed in the home in community    Baseline  0.26 m/s    Time  6    Period  Weeks    Status  New            Plan - 10/21/17 1645    Clinical Impression Statement  Patient instructed in beginning  LE strengthening in standing, supine, and balance training with weight shifting.  Patient required mod VCs to improve technique and to improve positioning for better strengthening. Patient performs open and closed chain exercises in standing and supine with fatigue after 10 reps and no reports  of pain.  [place]  would benefit from additional skilled PT intervention to improve balance/gait safety and reduce fall risk.    Rehab Potential  Good    PT Frequency  2x / week    PT Duration  8  weeks    PT Treatment/Interventions  ADLs/Self Care Home  Management;Aquatic Therapy;Cryotherapy;Electrical Stimulation;Iontophoresis 4mg /ml Dexamethasone;Moist Heat;Traction;Ultrasound;DME Instruction;Gait training;Stair training;Functional mobility training;Therapeutic activities;Therapeutic exercise;Balance training;Neuromuscular re-education;Patient/family education;Orthotic Fit/Training;Wheelchair mobility training;Manual techniques;Compression bandaging;Passive range of motion;Dry needling;Energy conservation;Taping    PT Next Visit Plan  Progress HEP and continue with strength and balance training, gait training    PT Home Exercise Plan  seated marching, LAQ    Consulted and Agree with Plan of Care  Patient       Patient will benefit from skilled therapeutic intervention in order to improve the following deficits and impairments:  Abnormal gait, Decreased activity tolerance, Decreased balance, Decreased endurance, Decreased knowledge of use of DME, Decreased mobility, Decreased range of motion, Decreased safety awareness, Decreased strength, Difficulty walking, Hypomobility, Increased fascial restricitons, Impaired perceived functional ability, Impaired flexibility, Increased muscle spasms, Impaired sensation, Improper body mechanics, Postural dysfunction, Pain  Visit Diagnosis: Unsteadiness on feet  Muscle weakness (generalized)  Other abnormalities of gait and mobility     Problem List Patient Active Problem List   Diagnosis Date Noted  . Malnutrition of moderate degree 03/31/2017  . Acute respiratory failure with hypoxia ([place]) 03/30/2017  . AKI (acute kidney injury) ([place]) 07/15/2016  . Protein-calorie malnutrition, severe 02/08/2016  . Primary cancer of right upper lobe of lung ([place]) 11/20/2015  . Abnormal CT lung screening 10/17/2015  . Personal history of tobacco use, presenting hazards to health 10/15/2015  . Chronic vulvitis 09/26/2014  . Allergic reaction 09/26/2014  .  Carotid stenosis 08/30/2014  . Cervical nerve root disorder 08/10/2014  . CAD in native artery 08/10/2014  . B12 deficiency 08/10/2014  . Back ache 08/10/2014  . Bronchitis, chronic ([place]) 08/10/2014  . Diabetes mellitus with polyneuropathy ([place]) 08/10/2014  . Can't get food down 08/10/2014  . Eczema of external ear 08/10/2014  . Accumulation of fluid in tissues 08/10/2014  . Gout 08/10/2014  . Adult hypothyroidism 08/10/2014  . Mononeuritis 08/10/2014  . Muscle ache 08/10/2014  . Disorder of peripheral nervous system 08/10/2014  . Lesion of vulva 08/10/2014  . Cervical spondylosis with radiculopathy 10/19/2013  . COPD exacerbation ([place]) 03/29/2013  . CAD (coronary artery disease) 06/22/2011  . COPD (chronic obstructive pulmonary disease) with emphysema ([place]) 03/07/2010  . CHEST PAIN UNSPECIFIED 07/22/2009  . HLD (hyperlipidemia) 04/01/2009  . Malaise and fatigue 04/01/2009  . Hyperlipidemia 01/18/2009  . TOBACCO ABUSE 01/18/2009  . HYPERTENSION, BENIGN 01/18/2009  . CLAUDICATION 01/18/2009  . Pain in limb 01/18/2009  . CAFL (chronic airflow limitation) ([place]) 01/20/2007  . Late effects of cerebrovascular disease 01/10/2007  . Essential (primary) hypertension 12/22/2006    Alanson Puls, [place] DPT 10/21/2017, 5:01 PM  [place] MAIN [facility] SERVICES [address] [place], [place], [address] Phone: [phone]   Fax:  [phone]  Name: ARRAYAH CONNORS MRN: [id number] Date of Birth: 27-Feb-1941

## 2017-10-21 NOTE — Progress Notes (Signed)
[name] [name]  MRN: [id number] DOB: [birth year]  Subjective:  HPI   The patient is a 77 year old female who presents for 1 month follow up after starting physical therapy for weakness of both lower extremities.  Patient has been going to therapy and reports that she is feeling good and getting her strength back.  Patient Active Problem List   Diagnosis Date Noted  . Malnutrition of moderate degree 03/31/2017  . Acute respiratory failure with hypoxia ([place]) 03/30/2017  . AKI (acute kidney injury) ([place]) 07/15/2016  . Protein-calorie malnutrition, severe 02/08/2016  . Primary cancer of right upper lobe of lung ([place]) 11/20/2015  . Abnormal CT lung screening 10/17/2015  . Personal history of tobacco use, presenting hazards to health 10/15/2015  . Chronic vulvitis 09/26/2014  . Allergic reaction 09/26/2014  . Carotid stenosis 08/30/2014  . Cervical nerve root disorder 08/10/2014  . CAD in native artery 08/10/2014  . B12 deficiency 08/10/2014  . Back ache 08/10/2014  . Bronchitis, chronic ([place]) 08/10/2014  . Diabetes mellitus with polyneuropathy ([place]) 08/10/2014  . Can't get food down 08/10/2014  . Eczema of external ear 08/10/2014  . Accumulation of fluid in tissues 08/10/2014  . Gout 08/10/2014  . Adult hypothyroidism 08/10/2014  . Mononeuritis 08/10/2014  . Muscle ache 08/10/2014  . Disorder of peripheral nervous system 08/10/2014  . Lesion of vulva 08/10/2014  . Cervical spondylosis with radiculopathy 10/19/2013  . COPD exacerbation ([place]) 03/29/2013  . CAD (coronary artery disease) 06/22/2011  . COPD (chronic obstructive pulmonary disease) with emphysema ([place]) 03/07/2010  . CHEST PAIN UNSPECIFIED 07/22/2009  . HLD (hyperlipidemia) 04/01/2009  . Malaise and fatigue 04/01/2009  . Hyperlipidemia 01/18/2009  . TOBACCO ABUSE 01/18/2009  . HYPERTENSION, BENIGN 01/18/2009  . CLAUDICATION 01/18/2009  . Pain in limb 01/18/2009  . CAFL (chronic airflow limitation) ([place])  01/20/2007  . Late effects of cerebrovascular disease 01/10/2007  . Essential (primary) hypertension 12/22/2006    Past Medical History:  Diagnosis Date  . Abnormal CT lung screening 10/17/2015  . COPD (chronic obstructive pulmonary disease) ([place])   . Coronary artery disease, non-occlusive    a. cath 2006: min nonobs CAD; b. cath 12/2010: cath LAD 50%, RCA 60%; c. 08/2013: Minimal luminal irregs, right dominant system with no significant CAD, diffuse luminal irregs noted. Normal EF 55%, no AS or MS.   . Diabetes mellitus   . Hyperlipemia    Followed by Dr. Rosanna Randy  . Hypertension   . Lung cancer ([place])   . Macular degeneration    rt  . Personal history of tobacco use, presenting hazards to health 10/15/2015  . Pneumonia    hx  . Shortness of breath   . Stroke [facility])     Social History   Socioeconomic History  . Marital status: Married    Spouse name: Not on file  . Number of children: 4  . Years of education: Not on file  . Highest education level: 11th grade  Occupational History  . Occupation: Retired, Licensed conveyancer: RETIRED  Social Needs  . Financial resource strain: Not on file  . Food insecurity:    Worry: Never true    Inability: Never true  . Transportation needs:    Medical: No    Non-medical: No  Tobacco Use  . Smoking status: Former Smoker    Packs/day: 1.00    Years: 50.00    Pack years: 50.00    Types: Cigarettes    Last attempt  to quit: 10/03/2015    Years since quitting: 2.0  . Smokeless tobacco: Never Used  . Tobacco comment: smokes 3 cigs daily 05/06/15. Pt instructed to quit.  Substance and Sexual Activity  . Alcohol use: No    Alcohol/week: 0.0 oz  . Drug use: No  . Sexual activity: Never  Lifestyle  . Physical activity:    Days per week: Not on file    Minutes per session: Not on file  . Stress: Not at all  Relationships  . Social connections:    Talks on phone: Not on file    Gets together: Not on file    Attends  religious service: Not on file    Active member of club or organization: Not on file    Attends meetings of clubs or organizations: Not on file    Relationship status: Not on file  . Intimate partner violence:    Fear of current or ex partner: Not on file    Emotionally abused: Not on file    Physically abused: Not on file    Forced sexual activity: Not on file  Other Topics Concern  . Not on file  Social History Narrative   Married with 4 children   Gets regular exercise    Outpatient Encounter Medications as of 10/21/2017  Medication Sig  . albuterol (PROVENTIL) (5 MG/ML) 0.5% nebulizer solution Take 0.5 mLs (2.5 mg total) by nebulization every 6 (six) hours as needed for wheezing or shortness of breath. (Patient taking differently: Take 2.5 mg by nebulization daily. )  . budesonide-formoterol (SYMBICORT) 160-4.5 MCG/ACT inhaler Inhale 2 puffs into the lungs 2 (two) times daily.  . fluticasone (FLONASE) 50 MCG/ACT nasal spray Place 2 sprays into both nostrils daily.  Marland Kitchen gabapentin (NEURONTIN) 600 MG tablet TAKE ONE-HALF TABLET BY  MOUTH TWO TIMES DAILY  . glimepiride (AMARYL) 2 MG tablet Take 1 tablet (2 mg total) by mouth 2 (two) times daily.  Marland Kitchen HYDROcodone-acetaminophen (NORCO) 5-325 MG tablet Take 1 tablet by mouth every 6 (six) hours as needed for moderate pain or severe pain.  . isosorbide mononitrate (IMDUR) 30 MG 24 hr tablet Take 1 tablet (30 mg total) by mouth daily.  . magnesium oxide (MAG-OX) 400 (241.3 MG) MG tablet Take 400 mg by mouth 2 (two) times daily.   . metoprolol tartrate (LOPRESSOR) 25 MG tablet Take 1 tablet (25 mg total) by mouth 2 (two) times daily.  . mometasone (ELOCON) 0.1 % cream Apply small amount nightly to each ear  . Multiple Vitamins-Minerals (ICAPS) CAPS Take 1 capsule by mouth 2 (two) times daily.   . nitroGLYCERIN (NITROSTAT) 0.4 MG SL tablet Place 1 tablet (0.4 mg total) under the tongue every 5 (five) minutes as needed for chest pain.  Marland Kitchen  ondansetron (ZOFRAN) 8 MG tablet Take 1 tablet (8 mg total) by mouth every 8 (eight) hours as needed for nausea or vomiting.  . pantoprazole (PROTONIX) 40 MG tablet Take 1 tablet (40 mg total) by mouth daily.  Marland Kitchen triamcinolone cream (KENALOG) 0.1 % Apply 1 application topically 2 (two) times daily.  Marland Kitchen LORazepam (ATIVAN) 1 MG tablet Take 1 tablet (1 mg total) by mouth 2 (two) times daily as needed for anxiety. (Patient not taking: Reported on 10/21/2017)  . ofloxacin (FLOXIN) 0.3 % OTIC solution PLACE 10 DROPS INTO THE LEFT EAR DAILY. (Patient not taking: Reported on 09/21/2017)   Facility-Administered Encounter Medications as of 10/21/2017  Medication  . ondansetron (ZOFRAN) 8 mg, dexamethasone (DECADRON) 10  mg in sodium chloride 0.9 % 50 mL IVPB    Allergies  Allergen Reactions  . Coconut Fatty Acids Swelling and Other (See Comments)    Throat swells    Review of Systems  Constitutional: Negative for fever and malaise/fatigue.  HENT: Negative.   Eyes: Negative.   Respiratory: Positive for cough. Negative for shortness of breath and wheezing.   Cardiovascular: Negative for chest pain, palpitations, orthopnea, claudication and leg swelling.  Gastrointestinal: Negative.   Skin: Negative.   Neurological: Positive for headaches (occasional). Negative for dizziness.  Endo/Heme/Allergies: Negative.   Psychiatric/Behavioral: Negative.     Objective:  BP 138/74 (BP Location: Right Arm, Patient Position: Sitting, Cuff Size: Normal)   Pulse 72   Temp (!) 97.4 F (36.3 C) (Oral)   Resp 16   Wt 149 lb (67.6 kg)   BMI 25.58 kg/m   Physical Exam  Constitutional: She is oriented to person, place, and time and well-developed, well-nourished, and in no distress.  HENT:  Head: Normocephalic and atraumatic.  Right Ear: External ear normal.  Left Ear: External ear normal.  Nose: Nose normal.  Eyes: Conjunctivae are normal. No scleral icterus.  Neck: No thyromegaly present.  Cardiovascular:  Normal rate, regular rhythm and normal heart sounds.  Pulmonary/Chest: Effort normal and breath sounds normal.  Abdominal: Soft.  Musculoskeletal: She exhibits no edema.  Neurological: She is alert and oriented to person, place, and time. Gait normal. GCS score is 15.  Skin: Skin is warm and dry.  Psychiatric: Mood, memory, affect and judgment normal.    Assessment and Plan :  Weakness/Bilateral leg pain Improved with PT COPD Small Cell Lung cancer CAD TIIDM  I have done the exam and reviewed the chart and it is accurate to the best of my knowledge. Development worker, community has been used and  any errors in dictation or transcription are unintentional. Miguel Aschoff M.D. [facility]

## 2017-10-23 DIAGNOSIS — R0602 Shortness of breath: Secondary | ICD-10-CM | POA: Diagnosis not present

## 2017-10-23 DIAGNOSIS — J449 Chronic obstructive pulmonary disease, unspecified: Secondary | ICD-10-CM | POA: Diagnosis not present

## 2017-10-25 DIAGNOSIS — J449 Chronic obstructive pulmonary disease, unspecified: Secondary | ICD-10-CM | POA: Diagnosis not present

## 2017-10-26 ENCOUNTER — Encounter: Payer: Self-pay | Admitting: Physical Therapy

## 2017-10-26 ENCOUNTER — Ambulatory Visit: Payer: Medicare Other | Admitting: Physical Therapy

## 2017-10-26 DIAGNOSIS — R2689 Other abnormalities of gait and mobility: Secondary | ICD-10-CM

## 2017-10-26 DIAGNOSIS — R2681 Unsteadiness on feet: Secondary | ICD-10-CM

## 2017-10-26 DIAGNOSIS — M6281 Muscle weakness (generalized): Secondary | ICD-10-CM | POA: Diagnosis not present

## 2017-10-26 NOTE — Therapy (Signed)
[place] MAIN [facility] SERVICES [address] [place], [place], [address] Phone: [phone]   Fax:  [phone]  Physical Therapy Treatment  Patient Details  Name: [name] [name] MRN: [id number] Date of Birth: [birth year] Referring Provider: Jerrol Banana, MD   Encounter Date: 10/26/2017  PT End of Session - 10/26/17 1333    Visit Number  5    Number of Visits  17    Date for PT Re-Evaluation  12/07/17    Authorization Type  5/10 progress note (start of reporting period 7/9)    PT Start Time  1302    PT Stop Time  1345    PT Time Calculation (min)  43 min    Equipment Utilized During Treatment  Gait belt    Activity Tolerance  Patient tolerated treatment well;Patient limited by fatigue    Behavior During Therapy  [facility] for tasks assessed/performed       Past Medical History:  Diagnosis Date  . Abnormal CT lung screening 10/17/2015  . COPD (chronic obstructive pulmonary disease) ([place])   . Coronary artery disease, non-occlusive    a. cath 2006: min nonobs CAD; b. cath 12/2010: cath LAD 50%, RCA 60%; c. 08/2013: Minimal luminal irregs, right dominant system with no significant CAD, diffuse luminal irregs noted. Normal EF 55%, no AS or MS.   . Diabetes mellitus   . Hyperlipemia    Followed by Dr. Rosanna Randy  . Hypertension   . Lung cancer ([place])   . Macular degeneration    rt  . Personal history of tobacco use, presenting hazards to health 10/15/2015  . Pneumonia    hx  . Shortness of breath   . Stroke [facility])     Past Surgical History:  Procedure Laterality Date  . ABDOMINAL HYSTERECTOMY    . ANTERIOR CERVICAL DECOMP/DISCECTOMY FUSION N/A 10/19/2013   Procedure: CERVICAL FIVE-SIX ANTERIOR CERVICAL DECOMPRESSION WITH FUSION INTERBODY PROSTHESIS PLATING AND PEEK CAGE;  Surgeon: Ophelia Charter, MD;  Location: [facility] NEURO ORS;  Service: Neurosurgery;  Laterality: N/A;  . BACK SURGERY  80's  . BREAST CYST EXCISION Left    left negative   .  CARDIAC CATHETERIZATION  05/2004  . CATARACT EXTRACTION Left   . CHOLECYSTECTOMY    . ENDARTERECTOMY Left 10/24/2014   Procedure: ENDARTERECTOMY CAROTID;  Surgeon: Algernon Huxley, MD;  Location: [facility] ORS;  Service: Vascular;  Laterality: Left;  . ENDOBRONCHIAL ULTRASOUND N/A 11/14/2015   Procedure: ENDOBRONCHIAL ULTRASOUND;  Surgeon: [name], MD;  Location: [facility] ORS;  Service: Cardiopulmonary;  Laterality: N/A;  . PERIPHERAL VASCULAR CATHETERIZATION N/A 12/04/2015   Procedure: Glori Luis Cath Insertion;  Surgeon: Algernon Huxley, MD;  Location: [place] CV LAB;  Service: Cardiovascular;  Laterality: N/A;  . PORTA CATH REMOVAL N/A 05/17/2017   Procedure: PORTA CATH REMOVAL;  Surgeon: Algernon Huxley, MD;  Location: [place] CV LAB;  Service: Cardiovascular;  Laterality: N/A;  . TONSILLECTOMY AND ADENOIDECTOMY    . VESICOVAGINAL FISTULA CLOSURE W/ TAH      There were no vitals filed for this visit.  Subjective Assessment - 10/26/17 1314    Subjective  Patient reports doing well; She denies any new falls; Denies any pain; Reports adherence to HEP daily with increased fatigue;     Pertinent History  Pt presents with reports of imbalance and BLE weakness. Pt reports that she had HHPT ~1 year ago and since has become more weak.  Pt reports 6 falls in the  past 6 months.  Pt reports she loses her balance due to her RLE giving way, causing her to fall.  Pt with h/o stroke but denies it affecting one side more than the other.  Pt with h/o lung cancer with last treatment ~2 years ago.  Pt reports 8/10 LBP which she attributes to falls in the past.  Pt is able to ascend/descend steps but this is challenging.   Pt has a ramp but she still goes up the steps.  Husband does the driving, grocery shopping.  Pt helps with cooking and cleaning.  Pt independent with showering and dressing.  Pt ambulates with her SPC at all times.  Pt checks her BP, pulse, O2, blood glucose each day.  She also checks the bottom of her feet  1x/wk due to her peripheral neuropathy (addressed this session to check 1x/day).     How long can you sit comfortably?  no issues    How long can you stand comfortably?  10 minutes    How long can you walk comfortably?  household distances    Patient Stated Goals  Pt would like to be able to walk farther, no falls, up and down steps    Currently in Pain?  No/denies    Multiple Pain Sites  No           TREATMENT: Warm up on Nustep BUE/BLE level 2 x5 min (unbilled)l;  Standing in parallel bars: Standing on airex: -heel/toe raises x15 with B rail assist; -Alternate march x10 with B rail assist -mini squat unsupported x10 -Modified tandem stance unsupported 10 sec hold x2 reps bilaterally -alternate toe taps from airex to 4 inch step with 1 rail assist x10 bilaterally; -standing one foot on airex, one foot on 4 inch step unsupported, head turns side/side x5 reps each foot on step with min VCs for visual stabilization to improve stance control;  Patient required min VCs for balance stability, including to increase trunk control for less loss of balance with smaller base of support. Required min A for safety with advanced balance exercise. Required min VCs to improve erect posture with erect head to challenge stance control;  Strengthening: Seated with 2# ankle weights: -alternate march x15 bilaterally; -alternate LAQ x15 bilaterally;  Standing with 2# ankle weight: -hip abduction x15 bilaterally -Side stepping x10 feet x2 laps each direction;  Patient required min-moderate verbal/tactile cues for correct exercise technique including cues to increase ROM for better strengthening;                    PT Education - 10/26/17 1353    Education Details  exercise technique, balance/strengthening;     Person(s) Educated  Patient    Methods  Explanation;Demonstration;Verbal cues    Comprehension  Verbalized understanding;Returned demonstration;Verbal cues required;Need  further instruction       PT Short Term Goals - 10/12/17 1617      PT SHORT TERM GOAL #1   Title  Pt will independently complete HEP at least 4 days/wk for improved carryover between sessions    Time  2    Period  Weeks    Status  New      PT SHORT TERM GOAL #2   Title  Pt will inspect her feet at least 1x/day for proper foot care due to peripheral neuropathy    Baseline  1x/wk    Time  2    Period  Weeks    Status  New  PT Long Term Goals - 10/12/17 1618      PT LONG TERM GOAL #1   Title  Pt's ABC scale will improve to at least 50% to demonstrate decreased perception of imbalance    Baseline  20%    Time  8    Period  Weeks    Status  New      PT LONG TERM GOAL #2   Title  Pt's TUG time (with SPC) will decrease to less than or equal to 20 seconds to demonstrate improved mobility    Baseline  32 seconds    Time  8    Period  Weeks    Status  New      PT LONG TERM GOAL #3   Title  Pt's Berg Balance Test will improve to at least 42/56 to demonstrate decreased risk of falling    Baseline  23/56    Time  8    Period  Weeks    Status  New      PT LONG TERM GOAL #4   Title  Pt's 66mWT (with SPC) will improve to at least 0.6 m/s to demonstrate improved gait speed in the home in community    Baseline  0.26 m/s    Time  6    Period  Weeks    Status  New            Plan - 10/26/17 1334    Clinical Impression Statement  Patient instructed in advanced balance exercise. She continues to require rail assist for dynamic balance tasks. Patient was able to challenge static balance while standing on compliant surface with less rail assist. She does require min A for safety. Patient instructed in advanced LE strengthening. She required min VCS to improve ROM for better strengthening. Patient reports mild fatigue at end of session. She would benefit from additional skilled PT intervention to improve strength, balance and gait safety;     Rehab Potential  Good    PT  Frequency  2x / week    PT Duration  8 weeks    PT Treatment/Interventions  ADLs/Self Care Home Management;Aquatic Therapy;Cryotherapy;Electrical Stimulation;Iontophoresis 4mg /ml Dexamethasone;Moist Heat;Traction;Ultrasound;DME Instruction;Gait training;Stair training;Functional mobility training;Therapeutic activities;Therapeutic exercise;Balance training;Neuromuscular re-education;Patient/family education;Orthotic Fit/Training;Wheelchair mobility training;Manual techniques;Compression bandaging;Passive range of motion;Dry needling;Energy conservation;Taping    PT Next Visit Plan  Progress HEP and continue with strength and balance training, gait training    PT Home Exercise Plan  seated marching, LAQ    Consulted and Agree with Plan of Care  Patient       Patient will benefit from skilled therapeutic intervention in order to improve the following deficits and impairments:  Abnormal gait, Decreased activity tolerance, Decreased balance, Decreased endurance, Decreased knowledge of use of DME, Decreased mobility, Decreased range of motion, Decreased safety awareness, Decreased strength, Difficulty walking, Hypomobility, Increased fascial restricitons, Impaired perceived functional ability, Impaired flexibility, Increased muscle spasms, Impaired sensation, Improper body mechanics, Postural dysfunction, Pain  Visit Diagnosis: Unsteadiness on feet  Muscle weakness (generalized)  Other abnormalities of gait and mobility     Problem List Patient Active Problem List   Diagnosis Date Noted  . Malnutrition of moderate degree 03/31/2017  . Acute respiratory failure with hypoxia ([place]) 03/30/2017  . AKI (acute kidney injury) ([place]) 07/15/2016  . Protein-calorie malnutrition, severe 02/08/2016  . Primary cancer of right upper lobe of lung ([place]) 11/20/2015  . Abnormal CT lung screening 10/17/2015  . Personal history of tobacco use, presenting hazards to health 10/15/2015  .  Chronic vulvitis  09/26/2014  . Allergic reaction 09/26/2014  . Carotid stenosis 08/30/2014  . Cervical nerve root disorder 08/10/2014  . CAD in native artery 08/10/2014  . B12 deficiency 08/10/2014  . Back ache 08/10/2014  . Bronchitis, chronic ([place]) 08/10/2014  . Diabetes mellitus with polyneuropathy ([place]) 08/10/2014  . Can't get food down 08/10/2014  . Eczema of external ear 08/10/2014  . Accumulation of fluid in tissues 08/10/2014  . Gout 08/10/2014  . Adult hypothyroidism 08/10/2014  . Mononeuritis 08/10/2014  . Muscle ache 08/10/2014  . Disorder of peripheral nervous system 08/10/2014  . Lesion of vulva 08/10/2014  . Cervical spondylosis with radiculopathy 10/19/2013  . COPD exacerbation ([place]) 03/29/2013  . CAD (coronary artery disease) 06/22/2011  . COPD (chronic obstructive pulmonary disease) with emphysema ([place]) 03/07/2010  . CHEST PAIN UNSPECIFIED 07/22/2009  . HLD (hyperlipidemia) 04/01/2009  . Malaise and fatigue 04/01/2009  . Hyperlipidemia 01/18/2009  . TOBACCO ABUSE 01/18/2009  . HYPERTENSION, BENIGN 01/18/2009  . CLAUDICATION 01/18/2009  . Pain in limb 01/18/2009  . CAFL (chronic airflow limitation) ([place]) 01/20/2007  . Late effects of cerebrovascular disease 01/10/2007  . Essential (primary) hypertension 12/22/2006    Trotter,Margaret PT, DPT 10/26/2017, 1:56 PM  [place] MAIN [facility] SERVICES [address] [place], [place], [address] Phone: [phone]   Fax:  [phone]  Name: ELICA ALMAS MRN: [id number] Date of Birth: Jun 04, 1940

## 2017-10-27 ENCOUNTER — Telehealth: Payer: Self-pay | Admitting: Family Medicine

## 2017-10-27 ENCOUNTER — Other Ambulatory Visit: Payer: Self-pay | Admitting: Family Medicine

## 2017-10-27 MED ORDER — ONETOUCH ULTRA 2 W/DEVICE KIT: 1.0000 | PACK | 0 refills | Status: DC

## 2017-10-27 NOTE — Telephone Encounter (Signed)
Pt requesting a new OneTouch meter be sent into [facility] in [place].

## 2017-11-02 ENCOUNTER — Ambulatory Visit: Payer: Medicare Other | Admitting: Physical Therapy

## 2017-11-02 ENCOUNTER — Encounter: Payer: Self-pay | Admitting: Physical Therapy

## 2017-11-02 DIAGNOSIS — M6281 Muscle weakness (generalized): Secondary | ICD-10-CM | POA: Diagnosis not present

## 2017-11-02 DIAGNOSIS — R2689 Other abnormalities of gait and mobility: Secondary | ICD-10-CM | POA: Diagnosis not present

## 2017-11-02 DIAGNOSIS — R2681 Unsteadiness on feet: Secondary | ICD-10-CM

## 2017-11-02 NOTE — Therapy (Signed)
[place] MAIN [facility] SERVICES [address] [place], [place], [address] Phone: [phone]   Fax:  [phone]  Physical Therapy Treatment  Patient Details  Name: [name] [name] MRN: [id number] Date of Birth: [birth year] Referring Provider: Jerrol Banana, MD   Encounter Date: 11/02/2017  PT End of Session - 11/02/17 1526    Visit Number  5    Number of Visits  17    Date for PT Re-Evaluation  12/07/17    Authorization Type  5/10 progress note (start of reporting period 7/9)    PT Start Time  1516    PT Stop Time  1600    PT Time Calculation (min)  44 min    Equipment Utilized During Treatment  Gait belt    Activity Tolerance  Patient tolerated treatment well;Patient limited by fatigue    Behavior During Therapy  [facility] for tasks assessed/performed       Past Medical History:  Diagnosis Date  . Abnormal CT lung screening 10/17/2015  . COPD (chronic obstructive pulmonary disease) ([place])   . Coronary artery disease, non-occlusive    a. cath 2006: min nonobs CAD; b. cath 12/2010: cath LAD 50%, RCA 60%; c. 08/2013: Minimal luminal irregs, right dominant system with no significant CAD, diffuse luminal irregs noted. Normal EF 55%, no AS or MS.   . Diabetes mellitus   . Hyperlipemia    Followed by Dr. Rosanna Randy  . Hypertension   . Lung cancer ([place])   . Macular degeneration    rt  . Personal history of tobacco use, presenting hazards to health 10/15/2015  . Pneumonia    hx  . Shortness of breath   . Stroke [facility])     Past Surgical History:  Procedure Laterality Date  . ABDOMINAL HYSTERECTOMY    . ANTERIOR CERVICAL DECOMP/DISCECTOMY FUSION N/A 10/19/2013   Procedure: CERVICAL FIVE-SIX ANTERIOR CERVICAL DECOMPRESSION WITH FUSION INTERBODY PROSTHESIS PLATING AND PEEK CAGE;  Surgeon: Ophelia Charter, MD;  Location: [place] NEURO ORS;  Service: Neurosurgery;  Laterality: N/A;  . BACK SURGERY  80's  . BREAST CYST EXCISION Left    left negative   .  CARDIAC CATHETERIZATION  05/2004  . CATARACT EXTRACTION Left   . CHOLECYSTECTOMY    . ENDARTERECTOMY Left 10/24/2014   Procedure: ENDARTERECTOMY CAROTID;  Surgeon: Algernon Huxley, MD;  Location: [facility] ORS;  Service: Vascular;  Laterality: Left;  . ENDOBRONCHIAL ULTRASOUND N/A 11/14/2015   Procedure: ENDOBRONCHIAL ULTRASOUND;  Surgeon: [name], MD;  Location: [facility] ORS;  Service: Cardiopulmonary;  Laterality: N/A;  . PERIPHERAL VASCULAR CATHETERIZATION N/A 12/04/2015   Procedure: Glori Luis Cath Insertion;  Surgeon: Algernon Huxley, MD;  Location: [place] CV LAB;  Service: Cardiovascular;  Laterality: N/A;  . PORTA CATH REMOVAL N/A 05/17/2017   Procedure: PORTA CATH REMOVAL;  Surgeon: Algernon Huxley, MD;  Location: [place] CV LAB;  Service: Cardiovascular;  Laterality: N/A;  . TONSILLECTOMY AND ADENOIDECTOMY    . VESICOVAGINAL FISTULA CLOSURE W/ TAH      There were no vitals filed for this visit.  Subjective Assessment - 11/02/17 1524    Subjective  Patient reports doing okay; She denies any pain currently; reports mild soreness after last session but states that it didn't last long; Reports adherence to HEP;     Pertinent History  Pt presents with reports of imbalance and BLE weakness. Pt reports that she had HHPT ~1 year ago and since has become more weak.  Pt reports 6 falls in the past 6 months.  Pt reports she loses her balance due to her RLE giving way, causing her to fall.  Pt with h/o stroke but denies it affecting one side more than the other.  Pt with h/o lung cancer with last treatment ~2 years ago.  Pt reports 8/10 LBP which she attributes to falls in the past.  Pt is able to ascend/descend steps but this is challenging.   Pt has a ramp but she still goes up the steps.  Husband does the driving, grocery shopping.  Pt helps with cooking and cleaning.  Pt independent with showering and dressing.  Pt ambulates with her SPC at all times.  Pt checks her BP, pulse, O2, blood glucose each day.   She also checks the bottom of her feet 1x/wk due to her peripheral neuropathy (addressed this session to check 1x/day).     How long can you sit comfortably?  no issues    How long can you stand comfortably?  10 minutes    How long can you walk comfortably?  household distances    Patient Stated Goals  Pt would like to be able to walk farther, no falls, up and down steps    Currently in Pain?  No/denies    Multiple Pain Sites  No          TREATMENT: Warm up on crosstrainer BUE/BLE level 1 x5 min (unbilled);  Standing in parallel bars:  Standing on 1/2 bolster (flat side up): -heel/toe rocks x15 - feet apart, balance with BUE wand flexion x10 reps; -tandem stance x10 sec hold x2 sets each foot in front, unsupported;  Forward/backward step over 1/2 bolster x10 reps with 1 rail assist with cues for weight shift and erect posture Side step over 1/2 bolster with 1 rail assist x10 reps each direction   Patient required min VCs for balance stability, including to increase trunk control for less loss of balance with smaller base of support. Required min A for safety with advanced balance exercise. Required min VCs to improve erect posture with erect head to challenge stance control;  Strengthening: Seated with 2# ankle weights: -alternate march x12 bilaterally; -alternate LAQ x12 bilaterally;  Standing with 2# ankle weight: -Heel raises x12 BLE -Hamstring curls x12 bilaterally -hip abduction x12 bilaterally -Side stepping x10 feet x2 laps each direction;  Patient required min-moderate verbal/tactile cues for correct exercise technique including cues to increase ROM for better strengthening;    Gait around gym in hallway on carpet x160 feet with 3 sitting rest breaks; Patient ambulates with tripod base cane with short shuffled steps requiring cues to increase step length and improve erect posture. Patient requires min A for safety due to imbalance.                        PT Education - 11/02/17 1525    Education Details  balance, strengthening, HEP    Person(s) Educated  Patient    Methods  Explanation;Demonstration;Verbal cues    Comprehension  Verbalized understanding;Returned demonstration;Verbal cues required;Need further instruction       PT Short Term Goals - 10/12/17 1617      PT SHORT TERM GOAL #1   Title  Pt will independently complete HEP at least 4 days/wk for improved carryover between sessions    Time  2    Period  Weeks    Status  New      PT SHORT  TERM GOAL #2   Title  Pt will inspect her feet at least 1x/day for proper foot care due to peripheral neuropathy    Baseline  1x/wk    Time  2    Period  Weeks    Status  New        PT Long Term Goals - 10/12/17 1618      PT LONG TERM GOAL #1   Title  Pt's ABC scale will improve to at least 50% to demonstrate decreased perception of imbalance    Baseline  20%    Time  8    Period  Weeks    Status  New      PT LONG TERM GOAL #2   Title  Pt's TUG time (with SPC) will decrease to less than or equal to 20 seconds to demonstrate improved mobility    Baseline  32 seconds    Time  8    Period  Weeks    Status  New      PT LONG TERM GOAL #3   Title  Pt's Berg Balance Test will improve to at least 42/56 to demonstrate decreased risk of falling    Baseline  23/56    Time  8    Period  Weeks    Status  New      PT LONG TERM GOAL #4   Title  Pt's 90mWT (with SPC) will improve to at least 0.6 m/s to demonstrate improved gait speed in the home in community    Baseline  0.26 m/s    Time  6    Period  Weeks    Status  New            Plan - 11/02/17 1611    Clinical Impression Statement  Patient instructed in advanced LE strengthening exercise. She required cues for correct exercise technique and to increase ROM for better strengthening. Patient fatigues quickly. Instructed patient in gait training in hallway on carpet. She exhibits  increased foot drag with prolonged ambulation. She did require min A with balance activities for safety; Patient would benefit from additional skilled PT intervention to improve strength, balance and gait safety;     Rehab Potential  Good    PT Frequency  2x / week    PT Duration  8 weeks    PT Treatment/Interventions  ADLs/Self Care Home Management;Aquatic Therapy;Cryotherapy;Electrical Stimulation;Iontophoresis 4mg /ml Dexamethasone;Moist Heat;Traction;Ultrasound;DME Instruction;Gait training;Stair training;Functional mobility training;Therapeutic activities;Therapeutic exercise;Balance training;Neuromuscular re-education;Patient/family education;Orthotic Fit/Training;Wheelchair mobility training;Manual techniques;Compression bandaging;Passive range of motion;Dry needling;Energy conservation;Taping    PT Next Visit Plan  Progress HEP and continue with strength and balance training, gait training    PT Home Exercise Plan  seated marching, LAQ    Consulted and Agree with Plan of Care  Patient       Patient will benefit from skilled therapeutic intervention in order to improve the following deficits and impairments:  Abnormal gait, Decreased activity tolerance, Decreased balance, Decreased endurance, Decreased knowledge of use of DME, Decreased mobility, Decreased range of motion, Decreased safety awareness, Decreased strength, Difficulty walking, Hypomobility, Increased fascial restricitons, Impaired perceived functional ability, Impaired flexibility, Increased muscle spasms, Impaired sensation, Improper body mechanics, Postural dysfunction, Pain  Visit Diagnosis: Unsteadiness on feet  Muscle weakness (generalized)  Other abnormalities of gait and mobility     Problem List Patient Active Problem List   Diagnosis Date Noted  . Malnutrition of moderate degree 03/31/2017  . Acute respiratory failure with hypoxia ([place]) 03/30/2017  . AKI (  acute kidney injury) ([place]) 07/15/2016  .  Protein-calorie malnutrition, severe 02/08/2016  . Primary cancer of right upper lobe of lung ([place]) 11/20/2015  . Abnormal CT lung screening 10/17/2015  . Personal history of tobacco use, presenting hazards to health 10/15/2015  . Chronic vulvitis 09/26/2014  . Allergic reaction 09/26/2014  . Carotid stenosis 08/30/2014  . Cervical nerve root disorder 08/10/2014  . CAD in native artery 08/10/2014  . B12 deficiency 08/10/2014  . Back ache 08/10/2014  . Bronchitis, chronic ([place]) 08/10/2014  . Diabetes mellitus with polyneuropathy ([place]) 08/10/2014  . Can't get food down 08/10/2014  . Eczema of external ear 08/10/2014  . Accumulation of fluid in tissues 08/10/2014  . Gout 08/10/2014  . Adult hypothyroidism 08/10/2014  . Mononeuritis 08/10/2014  . Muscle ache 08/10/2014  . Disorder of peripheral nervous system 08/10/2014  . Lesion of vulva 08/10/2014  . Cervical spondylosis with radiculopathy 10/19/2013  . COPD exacerbation ([place]) 03/29/2013  . CAD (coronary artery disease) 06/22/2011  . COPD (chronic obstructive pulmonary disease) with emphysema ([place]) 03/07/2010  . CHEST PAIN UNSPECIFIED 07/22/2009  . HLD (hyperlipidemia) 04/01/2009  . Malaise and fatigue 04/01/2009  . Hyperlipidemia 01/18/2009  . TOBACCO ABUSE 01/18/2009  . HYPERTENSION, BENIGN 01/18/2009  . CLAUDICATION 01/18/2009  . Pain in limb 01/18/2009  . CAFL (chronic airflow limitation) ([place]) 01/20/2007  . Late effects of cerebrovascular disease 01/10/2007  . Essential (primary) hypertension 12/22/2006    Tache Bobst PT, DPT 11/02/2017, 4:34 PM  [place] MAIN [facility] SERVICES [address] [place], [place], [address] Phone: [phone]   Fax:  [phone]  Name: GUYLA BLESS MRN: [id number] Date of Birth: 03/30/1941

## 2017-11-04 ENCOUNTER — Ambulatory Visit: Payer: Medicare Other | Attending: Family Medicine

## 2017-11-04 VITALS — BP 146/56 | HR 64

## 2017-11-04 DIAGNOSIS — R2681 Unsteadiness on feet: Secondary | ICD-10-CM | POA: Diagnosis not present

## 2017-11-04 DIAGNOSIS — M6281 Muscle weakness (generalized): Secondary | ICD-10-CM | POA: Insufficient documentation

## 2017-11-04 DIAGNOSIS — R2689 Other abnormalities of gait and mobility: Secondary | ICD-10-CM | POA: Insufficient documentation

## 2017-11-04 NOTE — Therapy (Signed)
[place] MAIN [facility] SERVICES [address] [place], [place], [address] Phone: [phone]   Fax:  [phone]  Physical Therapy Treatment  Patient Details  Name: [name] [name] MRN: [id number] Date of Birth: 19-Feb-1941 Referring Provider: Jerrol Banana, MD   Encounter Date: 11/04/2017  PT End of Session - 11/04/17 1512    Visit Number  6    Number of Visits  17    Date for PT Re-Evaluation  12/07/17    Authorization Type  6/10 progress note (start of reporting period 7/9)    PT Start Time  1515    PT Stop Time  1600    PT Time Calculation (min)  45 min    Equipment Utilized During Treatment  Gait belt    Activity Tolerance  Patient tolerated treatment well;Patient limited by fatigue    Behavior During Therapy  [facility] for tasks assessed/performed       Past Medical History:  Diagnosis Date  . Abnormal CT lung screening 10/17/2015  . COPD (chronic obstructive pulmonary disease) ([place])   . Coronary artery disease, non-occlusive    a. cath 2006: min nonobs CAD; b. cath 12/2010: cath LAD 50%, RCA 60%; c. 08/2013: Minimal luminal irregs, right dominant system with no significant CAD, diffuse luminal irregs noted. Normal EF 55%, no AS or MS.   . Diabetes mellitus   . Hyperlipemia    Followed by Dr. Rosanna Randy  . Hypertension   . Lung cancer ([place])   . Macular degeneration    rt  . Personal history of tobacco use, presenting hazards to health 10/15/2015  . Pneumonia    hx  . Shortness of breath   . Stroke [facility])     Past Surgical History:  Procedure Laterality Date  . ABDOMINAL HYSTERECTOMY    . ANTERIOR CERVICAL DECOMP/DISCECTOMY FUSION N/A 10/19/2013   Procedure: CERVICAL FIVE-SIX ANTERIOR CERVICAL DECOMPRESSION WITH FUSION INTERBODY PROSTHESIS PLATING AND PEEK CAGE;  Surgeon: Ophelia Charter, MD;  Location: [place] NEURO ORS;  Service: Neurosurgery;  Laterality: N/A;  . BACK SURGERY  80's  . BREAST CYST EXCISION Left    left negative   .  CARDIAC CATHETERIZATION  05/2004  . CATARACT EXTRACTION Left   . CHOLECYSTECTOMY    . ENDARTERECTOMY Left 10/24/2014   Procedure: ENDARTERECTOMY CAROTID;  Surgeon: Algernon Huxley, MD;  Location: [facility] ORS;  Service: Vascular;  Laterality: Left;  . ENDOBRONCHIAL ULTRASOUND N/A 11/14/2015   Procedure: ENDOBRONCHIAL ULTRASOUND;  Surgeon: [name], MD;  Location: [facility] ORS;  Service: Cardiopulmonary;  Laterality: N/A;  . PERIPHERAL VASCULAR CATHETERIZATION N/A 12/04/2015   Procedure: Glori Luis Cath Insertion;  Surgeon: Algernon Huxley, MD;  Location: [place] CV LAB;  Service: Cardiovascular;  Laterality: N/A;  . PORTA CATH REMOVAL N/A 05/17/2017   Procedure: PORTA CATH REMOVAL;  Surgeon: Algernon Huxley, MD;  Location: [place] CV LAB;  Service: Cardiovascular;  Laterality: N/A;  . TONSILLECTOMY AND ADENOIDECTOMY    . VESICOVAGINAL FISTULA CLOSURE W/ TAH      Vitals:   11/04/17 1519  BP: (!) 146/56  Pulse: 64  SpO2: 97%    Subjective Assessment - 11/04/17 1512    Subjective  Patient reports doing well today but is a little tired from running errands this morning. She denies any pain currently. No specific questions or concerns at his time. Reports adherence to HEP.     Pertinent History  Pt presents with reports of imbalance and BLE weakness. Pt  reports that she had HHPT ~1 year ago and since has become more weak.  Pt reports 6 falls in the past 6 months.  Pt reports she loses her balance due to her RLE giving way, causing her to fall.  Pt with h/o stroke but denies it affecting one side more than the other.  Pt with h/o lung cancer with last treatment ~2 years ago.  Pt reports 8/10 LBP which she attributes to falls in the past.  Pt is able to ascend/descend steps but this is challenging.   Pt has a ramp but she still goes up the steps.  Husband does the driving, grocery shopping.  Pt helps with cooking and cleaning.  Pt independent with showering and dressing.  Pt ambulates with her SPC at all times.   Pt checks her BP, pulse, O2, blood glucose each day.  She also checks the bottom of her feet 1x/wk due to her peripheral neuropathy (addressed this session to check 1x/day).     How long can you sit comfortably?  no issues    How long can you stand comfortably?  10 minutes    How long can you walk comfortably?  household distances    Patient Stated Goals  Pt would like to be able to walk farther, no falls, up and down steps    Currently in Pain?  No/denies          TREATMENT:   Ther-ex  Warm up on NuStep L2 (seat position 7, grips 8) x 5 minutes during history (3 minutes unbilled); Quantum leg press 105# x 15, 110# x 15 (fatigue noted by the last repetition);  Standing with 2# ankle weight: Marches x 15 bilateral; Hamstring curls x 15 BLE; Hip abduction x 15 BLE; Hip extension x 15 BLE; Heel raises x 15 BLE;  Neuromuscular Re-education  Standing on 1/2 bolster (flat side up): heel/toe rocks x15 feet apart, balance with BUE 2# wand flexion x10 reps; tandem stance x 30 sec hold x 2 sets each foot in front, unsupported; Forward/backward step over 1/2 bolster x 10 reps on each side with no UE support;  Patient required min VCs for balance stability, including to increase trunk control for less loss of balance with smaller base of support. Required min A for safety with advanced balance exercise. Required min VCs to improve erect posture with erect head to challenge stance control;                       PT Education - 11/04/17 1523    Education Details  exercise form/technique    Person(s) Educated  Patient    Methods  Explanation    Comprehension  Verbalized understanding       PT Short Term Goals - 10/12/17 1617      PT SHORT TERM GOAL #1   Title  Pt will independently complete HEP at least 4 days/wk for improved carryover between sessions    Time  2    Period  Weeks    Status  New      PT SHORT TERM GOAL #2   Title  Pt will inspect her feet at  least 1x/day for proper foot care due to peripheral neuropathy    Baseline  1x/wk    Time  2    Period  Weeks    Status  New        PT Long Term Goals - 10/12/17 1618      PT  LONG TERM GOAL #1   Title  Pt's ABC scale will improve to at least 50% to demonstrate decreased perception of imbalance    Baseline  20%    Time  8    Period  Weeks    Status  New      PT LONG TERM GOAL #2   Title  Pt's TUG time (with SPC) will decrease to less than or equal to 20 seconds to demonstrate improved mobility    Baseline  32 seconds    Time  8    Period  Weeks    Status  New      PT LONG TERM GOAL #3   Title  Pt's Berg Balance Test will improve to at least 42/56 to demonstrate decreased risk of falling    Baseline  23/56    Time  8    Period  Weeks    Status  New      PT LONG TERM GOAL #4   Title  Pt's 34mWT (with SPC) will improve to at least 0.6 m/s to demonstrate improved gait speed in the home in community    Baseline  0.26 m/s    Time  6    Period  Weeks    Status  New            Plan - 11/04/17 1513    Clinical Impression Statement  Patient instructed in advanced LE strengthening exercises. She required cues for correct exercise technique during hip strengthening in parallel bars. Patient somewhat fatigued today and she was provided multiple rest breaks.. She states that her biggest issue is trying to get up from chair so added some leg press to her exercises today. She continues requiring min A with balance activities for safety. Patient would benefit from additional skilled PT intervention to improve strength, balance and gait safety.     Rehab Potential  Good    PT Frequency  2x / week    PT Duration  8 weeks    PT Treatment/Interventions  ADLs/Self Care Home Management;Aquatic Therapy;Cryotherapy;Electrical Stimulation;Iontophoresis 4mg /ml Dexamethasone;Moist Heat;Traction;Ultrasound;DME Instruction;Gait training;Stair training;Functional mobility training;Therapeutic  activities;Therapeutic exercise;Balance training;Neuromuscular re-education;Patient/family education;Orthotic Fit/Training;Wheelchair mobility training;Manual techniques;Compression bandaging;Passive range of motion;Dry needling;Energy conservation;Taping    PT Next Visit Plan  Progress HEP and continue with strength and balance training, gait training    PT Home Exercise Plan  seated marching, LAQ    Consulted and Agree with Plan of Care  Patient       Patient will benefit from skilled therapeutic intervention in order to improve the following deficits and impairments:  Abnormal gait, Decreased activity tolerance, Decreased balance, Decreased endurance, Decreased knowledge of use of DME, Decreased mobility, Decreased range of motion, Decreased safety awareness, Decreased strength, Difficulty walking, Hypomobility, Increased fascial restricitons, Impaired perceived functional ability, Impaired flexibility, Increased muscle spasms, Impaired sensation, Improper body mechanics, Postural dysfunction, Pain  Visit Diagnosis: Unsteadiness on feet  Muscle weakness (generalized)  Other abnormalities of gait and mobility     Problem List Patient Active Problem List   Diagnosis Date Noted  . Malnutrition of moderate degree 03/31/2017  . Acute respiratory failure with hypoxia ([place]) 03/30/2017  . AKI (acute kidney injury) ([place]) 07/15/2016  . Protein-calorie malnutrition, severe 02/08/2016  . Primary cancer of right upper lobe of lung ([place]) 11/20/2015  . Abnormal CT lung screening 10/17/2015  . Personal history of tobacco use, presenting hazards to health 10/15/2015  . Chronic vulvitis 09/26/2014  . Allergic reaction 09/26/2014  . Carotid  stenosis 08/30/2014  . Cervical nerve root disorder 08/10/2014  . CAD in native artery 08/10/2014  . B12 deficiency 08/10/2014  . Back ache 08/10/2014  . Bronchitis, chronic ([place]) 08/10/2014  . Diabetes mellitus with polyneuropathy ([place]) 08/10/2014  . Can't  get food down 08/10/2014  . Eczema of external ear 08/10/2014  . Accumulation of fluid in tissues 08/10/2014  . Gout 08/10/2014  . Adult hypothyroidism 08/10/2014  . Mononeuritis 08/10/2014  . Muscle ache 08/10/2014  . Disorder of peripheral nervous system 08/10/2014  . Lesion of vulva 08/10/2014  . Cervical spondylosis with radiculopathy 10/19/2013  . COPD exacerbation ([place]) 03/29/2013  . CAD (coronary artery disease) 06/22/2011  . COPD (chronic obstructive pulmonary disease) with emphysema ([place]) 03/07/2010  . CHEST PAIN UNSPECIFIED 07/22/2009  . HLD (hyperlipidemia) 04/01/2009  . Malaise and fatigue 04/01/2009  . Hyperlipidemia 01/18/2009  . TOBACCO ABUSE 01/18/2009  . HYPERTENSION, BENIGN 01/18/2009  . CLAUDICATION 01/18/2009  . Pain in limb 01/18/2009  . CAFL (chronic airflow limitation) ([place]) 01/20/2007  . Late effects of cerebrovascular disease 01/10/2007  . Essential (primary) hypertension 12/22/2006   Phillips Grout PT, DPT, GCS  Jensyn Cambria 11/04/2017, 4:26 PM  [place] MAIN [facility] SERVICES [address] [place], [place], [address] Phone: [phone]   Fax:  [phone]  Name: KINNEDY MONGIELLO MRN: [id number] Date of Birth: 12-13-1940

## 2017-11-09 ENCOUNTER — Ambulatory Visit: Payer: Medicare Other | Admitting: Physical Therapy

## 2017-11-09 ENCOUNTER — Ambulatory Visit: Payer: Medicare Other

## 2017-11-09 DIAGNOSIS — R2681 Unsteadiness on feet: Secondary | ICD-10-CM | POA: Diagnosis not present

## 2017-11-09 DIAGNOSIS — R2689 Other abnormalities of gait and mobility: Secondary | ICD-10-CM | POA: Diagnosis not present

## 2017-11-09 DIAGNOSIS — M6281 Muscle weakness (generalized): Secondary | ICD-10-CM | POA: Diagnosis not present

## 2017-11-09 NOTE — Therapy (Signed)
[place] MAIN [facility] SERVICES [address] [place], [place], [address] Phone: [phone]   Fax:  [phone]  Physical Therapy Treatment  Patient Details  Name: [name] [name] MRN: [id number] Date of Birth: [birth year] Referring Provider: Jerrol Banana, MD   Encounter Date: 11/09/2017  PT End of Session - 11/09/17 1520    Visit Number  7    Number of Visits  17    Date for PT Re-Evaluation  12/07/17    Authorization Type  7/10 progress note (start of reporting period 7/9)    PT Start Time  1500    PT Stop Time  1528    PT Time Calculation (min)  28 min    Equipment Utilized During Treatment  Gait belt    Activity Tolerance  Patient tolerated treatment well;Patient limited by fatigue    Behavior During Therapy  [facility] for tasks assessed/performed       Past Medical History:  Diagnosis Date  . Abnormal CT lung screening 10/17/2015  . COPD (chronic obstructive pulmonary disease) ([place])   . Coronary artery disease, non-occlusive    a. cath 2006: min nonobs CAD; b. cath 12/2010: cath LAD 50%, RCA 60%; c. 08/2013: Minimal luminal irregs, right dominant system with no significant CAD, diffuse luminal irregs noted. Normal EF 55%, no AS or MS.   . Diabetes mellitus   . Hyperlipemia    Followed by Dr. Rosanna Randy  . Hypertension   . Lung cancer ([place])   . Macular degeneration    rt  . Personal history of tobacco use, presenting hazards to health 10/15/2015  . Pneumonia    hx  . Shortness of breath   . Stroke [facility])     Past Surgical History:  Procedure Laterality Date  . ABDOMINAL HYSTERECTOMY    . ANTERIOR CERVICAL DECOMP/DISCECTOMY FUSION N/A 10/19/2013   Procedure: CERVICAL FIVE-SIX ANTERIOR CERVICAL DECOMPRESSION WITH FUSION INTERBODY PROSTHESIS PLATING AND PEEK CAGE;  Surgeon: Ophelia Charter, MD;  Location: [place] NEURO ORS;  Service: Neurosurgery;  Laterality: N/A;  . BACK SURGERY  80's  . BREAST CYST EXCISION Left    left negative   .  CARDIAC CATHETERIZATION  05/2004  . CATARACT EXTRACTION Left   . CHOLECYSTECTOMY    . ENDARTERECTOMY Left 10/24/2014   Procedure: ENDARTERECTOMY CAROTID;  Surgeon: Algernon Huxley, MD;  Location: [facility] ORS;  Service: Vascular;  Laterality: Left;  . ENDOBRONCHIAL ULTRASOUND N/A 11/14/2015   Procedure: ENDOBRONCHIAL ULTRASOUND;  Surgeon: [name], MD;  Location: [facility] ORS;  Service: Cardiopulmonary;  Laterality: N/A;  . PERIPHERAL VASCULAR CATHETERIZATION N/A 12/04/2015   Procedure: Glori Luis Cath Insertion;  Surgeon: Algernon Huxley, MD;  Location: [place] CV LAB;  Service: Cardiovascular;  Laterality: N/A;  . PORTA CATH REMOVAL N/A 05/17/2017   Procedure: PORTA CATH REMOVAL;  Surgeon: Algernon Huxley, MD;  Location: [place] CV LAB;  Service: Cardiovascular;  Laterality: N/A;  . TONSILLECTOMY AND ADENOIDECTOMY    . VESICOVAGINAL FISTULA CLOSURE W/ TAH      There were no vitals filed for this visit.  Subjective Assessment - 11/09/17 1636    Subjective  Pt states that she is doing well today. No specific questions or concerns and denies pain upon arrival. Pt arrived approximately 30 minutes late for her appointment.     Pertinent History  Pt presents with reports of imbalance and BLE weakness. Pt reports that she had HHPT ~1 year ago and since has become more weak.  Pt reports 6 falls in the past 6 months.  Pt reports she loses her balance due to her RLE giving way, causing her to fall.  Pt with h/o stroke but denies it affecting one side more than the other.  Pt with h/o lung cancer with last treatment ~2 years ago.  Pt reports 8/10 LBP which she attributes to falls in the past.  Pt is able to ascend/descend steps but this is challenging.   Pt has a ramp but she still goes up the steps.  Husband does the driving, grocery shopping.  Pt helps with cooking and cleaning.  Pt independent with showering and dressing.  Pt ambulates with her SPC at all times.  Pt checks her BP, pulse, O2, blood glucose each day.   She also checks the bottom of her feet 1x/wk due to her peripheral neuropathy (addressed this session to check 1x/day).     How long can you sit comfortably?  no issues    How long can you stand comfortably?  10 minutes    How long can you walk comfortably?  household distances    Patient Stated Goals  Pt would like to be able to walk farther, no falls, up and down steps    Currently in Pain?  No/denies           TREATMENT  Ther-ex  Warm up on NuStep L2 (seat position 7, grips 10) x 5 minutes during history (3 minutes unbilled); Quantum leg press 110# x 15, 120# x 15 (fatigue noted by the last repetition);  Standing with 3# ankle weight: Marches x 15 bilateral; Hamstring curls x 15 BLE; Hip abduction x 15BLE; Hip extension x 15 BLE;  Heel/toe raises x 15 each BLE; Mini squats x 15;                     PT Education - 11/09/17 1637    Education Details  exercise form/technique    Person(s) Educated  Patient    Methods  Explanation    Comprehension  Verbalized understanding       PT Short Term Goals - 10/12/17 1617      PT SHORT TERM GOAL #1   Title  Pt will independently complete HEP at least 4 days/wk for improved carryover between sessions    Time  2    Period  Weeks    Status  New      PT SHORT TERM GOAL #2   Title  Pt will inspect her feet at least 1x/day for proper foot care due to peripheral neuropathy    Baseline  1x/wk    Time  2    Period  Weeks    Status  New        PT Long Term Goals - 10/12/17 1618      PT LONG TERM GOAL #1   Title  Pt's ABC scale will improve to at least 50% to demonstrate decreased perception of imbalance    Baseline  20%    Time  8    Period  Weeks    Status  New      PT LONG TERM GOAL #2   Title  Pt's TUG time (with SPC) will decrease to less than or equal to 20 seconds to demonstrate improved mobility    Baseline  32 seconds    Time  8    Period  Weeks    Status  New      PT LONG  TERM GOAL #3    Title  Pt's Berg Balance Test will improve to at least 42/56 to demonstrate decreased risk of falling    Baseline  23/56    Time  8    Period  Weeks    Status  New      PT LONG TERM GOAL #4   Title  Pt's 78mWT (with SPC) will improve to at least 0.6 m/s to demonstrate improved gait speed in the home in community    Baseline  0.26 m/s    Time  6    Period  Weeks    Status  New            Plan - 11/09/17 1520    Clinical Impression Statement  Patient instructed in advanced LE strengthening exercises today. Resistance progressed on leg press and with standing exercises in parallel bars. Session is limited in time today due to patient arriving approximately 30 minutes late. She continues requiring cues for proper technique with exercise.Marland Kitchen  Pt encouraged to continue HEP and follow-up as scheduled. Patient would benefit from additional skilled PT intervention to improve strength, balance and gait safety.    Rehab Potential  Good    PT Frequency  2x / week    PT Duration  8 weeks    PT Treatment/Interventions  ADLs/Self Care Home Management;Aquatic Therapy;Cryotherapy;Electrical Stimulation;Iontophoresis 4mg /ml Dexamethasone;Moist Heat;Traction;Ultrasound;DME Instruction;Gait training;Stair training;Functional mobility training;Therapeutic activities;Therapeutic exercise;Balance training;Neuromuscular re-education;Patient/family education;Orthotic Fit/Training;Wheelchair mobility training;Manual techniques;Compression bandaging;Passive range of motion;Dry needling;Energy conservation;Taping    PT Next Visit Plan  Progress HEP and continue with strength and balance training, gait training    PT Home Exercise Plan  seated marching, LAQ    Consulted and Agree with Plan of Care  Patient       Patient will benefit from skilled therapeutic intervention in order to improve the following deficits and impairments:  Abnormal gait, Decreased activity tolerance, Decreased balance, Decreased endurance,  Decreased knowledge of use of DME, Decreased mobility, Decreased range of motion, Decreased safety awareness, Decreased strength, Difficulty walking, Hypomobility, Increased fascial restricitons, Impaired perceived functional ability, Impaired flexibility, Increased muscle spasms, Impaired sensation, Improper body mechanics, Postural dysfunction, Pain  Visit Diagnosis: Unsteadiness on feet  Muscle weakness (generalized)     Problem List Patient Active Problem List   Diagnosis Date Noted  . Malnutrition of moderate degree 03/31/2017  . Acute respiratory failure with hypoxia ([place]) 03/30/2017  . AKI (acute kidney injury) ([place]) 07/15/2016  . Protein-calorie malnutrition, severe 02/08/2016  . Primary cancer of right upper lobe of lung ([place]) 11/20/2015  . Abnormal CT lung screening 10/17/2015  . Personal history of tobacco use, presenting hazards to health 10/15/2015  . Chronic vulvitis 09/26/2014  . Allergic reaction 09/26/2014  . Carotid stenosis 08/30/2014  . Cervical nerve root disorder 08/10/2014  . CAD in native artery 08/10/2014  . B12 deficiency 08/10/2014  . Back ache 08/10/2014  . Bronchitis, chronic ([place]) 08/10/2014  . Diabetes mellitus with polyneuropathy ([place]) 08/10/2014  . Can't get food down 08/10/2014  . Eczema of external ear 08/10/2014  . Accumulation of fluid in tissues 08/10/2014  . Gout 08/10/2014  . Adult hypothyroidism 08/10/2014  . Mononeuritis 08/10/2014  . Muscle ache 08/10/2014  . Disorder of peripheral nervous system 08/10/2014  . Lesion of vulva 08/10/2014  . Cervical spondylosis with radiculopathy 10/19/2013  . COPD exacerbation ([place]) 03/29/2013  . CAD (coronary artery disease) 06/22/2011  . COPD (chronic obstructive pulmonary disease) with emphysema ([place]) 03/07/2010  . CHEST PAIN  UNSPECIFIED 07/22/2009  . HLD (hyperlipidemia) 04/01/2009  . Malaise and fatigue 04/01/2009  . Hyperlipidemia 01/18/2009  . TOBACCO ABUSE 01/18/2009  . HYPERTENSION,  BENIGN 01/18/2009  . CLAUDICATION 01/18/2009  . Pain in limb 01/18/2009  . CAFL (chronic airflow limitation) ([place]) 01/20/2007  . Late effects of cerebrovascular disease 01/10/2007  . Essential (primary) hypertension 12/22/2006   Phillips Grout PT, DPT, GCS  Toniette Devera 11/09/2017, 4:39 PM  [place] MAIN [facility] SERVICES [address] [place], [place], [address] Phone: [phone]   Fax:  [phone]  Name: RIHANNA MARSEILLE MRN: [id number] Date of Birth: [birth year]

## 2017-11-11 ENCOUNTER — Ambulatory Visit: Payer: Medicare Other | Admitting: Physical Therapy

## 2017-11-11 ENCOUNTER — Encounter: Payer: Self-pay | Admitting: Physical Therapy

## 2017-11-11 DIAGNOSIS — R2681 Unsteadiness on feet: Secondary | ICD-10-CM

## 2017-11-11 DIAGNOSIS — M6281 Muscle weakness (generalized): Secondary | ICD-10-CM | POA: Diagnosis not present

## 2017-11-11 DIAGNOSIS — R2689 Other abnormalities of gait and mobility: Secondary | ICD-10-CM | POA: Diagnosis not present

## 2017-11-11 NOTE — Therapy (Addendum)
[place] MAIN [facility] SERVICES [address] [place], [place], [address] Phone: [phone]   Fax:  [phone]  Physical Therapy Treatment  Patient Details  Name: [name] [name] MRN: [id number] Date of Birth: 1940/11/18 Referring Provider: Jerrol Banana, MD   Encounter Date: 11/11/2017  PT End of Session - 11/11/17 1526    Visit Number  8    Number of Visits  17    Date for PT Re-Evaluation  12/07/17    Authorization Type  8/10 progress note (start of reporting period 7/9)    PT Start Time  1516    PT Stop Time  1600    PT Time Calculation (min)  44 min    Equipment Utilized During Treatment  Gait belt    Activity Tolerance  Patient tolerated treatment well;Patient limited by fatigue    Behavior During Therapy  [facility] for tasks assessed/performed       Past Medical History:  Diagnosis Date  . Abnormal CT lung screening 10/17/2015  . COPD (chronic obstructive pulmonary disease) ([place])   . Coronary artery disease, non-occlusive    a. cath 2006: min nonobs CAD; b. cath 12/2010: cath LAD 50%, RCA 60%; c. 08/2013: Minimal luminal irregs, right dominant system with no significant CAD, diffuse luminal irregs noted. Normal EF 55%, no AS or MS.   . Diabetes mellitus   . Hyperlipemia    Followed by Dr. Rosanna Randy  . Hypertension   . Lung cancer ([place])   . Macular degeneration    rt  . Personal history of tobacco use, presenting hazards to health 10/15/2015  . Pneumonia    hx  . Shortness of breath   . Stroke [facility])     Past Surgical History:  Procedure Laterality Date  . ABDOMINAL HYSTERECTOMY    . ANTERIOR CERVICAL DECOMP/DISCECTOMY FUSION N/A 10/19/2013   Procedure: CERVICAL FIVE-SIX ANTERIOR CERVICAL DECOMPRESSION WITH FUSION INTERBODY PROSTHESIS PLATING AND PEEK CAGE;  Surgeon: Ophelia Charter, MD;  Location: [place] NEURO ORS;  Service: Neurosurgery;  Laterality: N/A;  . BACK SURGERY  80's  . BREAST CYST EXCISION Left    left negative   .  CARDIAC CATHETERIZATION  05/2004  . CATARACT EXTRACTION Left   . CHOLECYSTECTOMY    . ENDARTERECTOMY Left 10/24/2014   Procedure: ENDARTERECTOMY CAROTID;  Surgeon: Algernon Huxley, MD;  Location: [facility] ORS;  Service: Vascular;  Laterality: Left;  . ENDOBRONCHIAL ULTRASOUND N/A 11/14/2015   Procedure: ENDOBRONCHIAL ULTRASOUND;  Surgeon: [name], MD;  Location: [facility] ORS;  Service: Cardiopulmonary;  Laterality: N/A;  . PERIPHERAL VASCULAR CATHETERIZATION N/A 12/04/2015   Procedure: Glori Luis Cath Insertion;  Surgeon: Algernon Huxley, MD;  Location: [place] CV LAB;  Service: Cardiovascular;  Laterality: N/A;  . PORTA CATH REMOVAL N/A 05/17/2017   Procedure: PORTA CATH REMOVAL;  Surgeon: Algernon Huxley, MD;  Location: [place] CV LAB;  Service: Cardiovascular;  Laterality: N/A;  . TONSILLECTOMY AND ADENOIDECTOMY    . VESICOVAGINAL FISTULA CLOSURE W/ TAH      There were no vitals filed for this visit.  Subjective Assessment - 11/11/17 1519    Subjective  Patient reports she is doing okay today; did have a fall last night in kitchen when getting something out of the refrigerator. Pt has been having pain since the fall yesterday; reports pain in L shoulder and L low back region.     Pertinent History  Pt presents with reports of imbalance and BLE weakness. Pt  reports that she had HHPT ~1 year ago and since has become more weak.  Pt reports 6 falls in the past 6 months.  Pt reports she loses her balance due to her RLE giving way, causing her to fall.  Pt with h/o stroke but denies it affecting one side more than the other.  Pt with h/o lung cancer with last treatment ~2 years ago.  Pt reports 8/10 LBP which she attributes to falls in the past.  Pt is able to ascend/descend steps but this is challenging.   Pt has a ramp but she still goes up the steps.  Husband does the driving, grocery shopping.  Pt helps with cooking and cleaning.  Pt independent with showering and dressing.  Pt ambulates with her SPC at all  times.  Pt checks her BP, pulse, O2, blood glucose each day.  She also checks the bottom of her feet 1x/wk due to her peripheral neuropathy (addressed this session to check 1x/day).     How long can you sit comfortably?  no issues    How long can you stand comfortably?  10 minutes    How long can you walk comfortably?  household distances    Patient Stated Goals  Pt would like to be able to walk farther, no falls, up and down steps    Currently in Pain?  Yes    Pain Score  8     Pain Location  Shoulder    Pain Orientation  Left    Pain Descriptors / Indicators  Constant;Aching    Pain Type  Acute pain    Pain Onset  Yesterday    Multiple Pain Sites  Yes    Pain Score  8    Pain Location  Back    Pain Orientation  Left;Lower    Pain Descriptors / Indicators  Aching;Constant    Pain Type  Acute pain    Pain Onset  Yesterday       Treatment Warm up on Nustep level 1 x4 min (unbilled)  1/2 foam roll in // bars, flat side up: AP rocks x2 min Maintaining balance in center of roll x30 sec holds x2 bouts without UE support   Seated therex with 2# ankle weights: Alternating marching x10 reps bilaterally, VCs to avoid painful ROM LAQs x10 reps with 3 sec holds, VCs for holding in full extension  Heel/toe raises x15 reps bilaterally, VCs for full ROM Hip adduction with ball squeezes x10 reps with 5 sec holds, VCs for holding for the full time and engaging the muscles on the inside of her thighs Hip abduction against red tband x15 reps bilaterally, VCs for controlling return back to neutral            PT Education - 11/11/17 1526    Education Details  exercise form/technique    Person(s) Educated  Patient    Methods  Explanation;Demonstration;Verbal cues    Comprehension  Verbalized understanding;Returned demonstration;Verbal cues required;Need further instruction       PT Short Term Goals - 10/12/17 1617      PT SHORT TERM GOAL #1   Title  Pt will independently complete  HEP at least 4 days/wk for improved carryover between sessions    Time  2    Period  Weeks    Status  New      PT SHORT TERM GOAL #2   Title  Pt will inspect her feet at least 1x/day for proper foot care due to  peripheral neuropathy    Baseline  1x/wk    Time  2    Period  Weeks    Status  New        PT Long Term Goals - 10/12/17 1618      PT LONG TERM GOAL #1   Title  Pt's ABC scale will improve to at least 50% to demonstrate decreased perception of imbalance    Baseline  20%    Time  8    Period  Weeks    Status  New      PT LONG TERM GOAL #2   Title  Pt's TUG time (with SPC) will decrease to less than or equal to 20 seconds to demonstrate improved mobility    Baseline  32 seconds    Time  8    Period  Weeks    Status  New      PT LONG TERM GOAL #3   Title  Pt's Berg Balance Test will improve to at least 42/56 to demonstrate decreased risk of falling    Baseline  23/56    Time  8    Period  Weeks    Status  New      PT LONG TERM GOAL #4   Title  Pt's 69mWT (with SPC) will improve to at least 0.6 m/s to demonstrate improved gait speed in the home in community    Baseline  0.26 m/s    Time  6    Period  Weeks    Status  New            Plan - 11/11/17 1658    Clinical Impression Statement  Patient tolerated therapy session well today. Pt was very hesitant to perform standing balance activities secondary to pain in L shoulder, low back area, and hip. Pt attempted standing balance activities but was unable to perform due to pain. Pt performed seated strengthening exercises without significant increases in pain; required demonstration and VCs for proper technique and form during all exercises. Pt also required rest breaks due to fatigue and soreness following fall. Pt encouraged to continue HEP as well. Pt will benefit from additional skilled PT intervention for improvements in balance, strength, and gait safety.     Rehab Potential  Good    PT Frequency  2x / week     PT Duration  8 weeks    PT Treatment/Interventions  ADLs/Self Care Home Management;Aquatic Therapy;Cryotherapy;Electrical Stimulation;Iontophoresis 4mg /ml Dexamethasone;Moist Heat;Traction;Ultrasound;DME Instruction;Gait training;Stair training;Functional mobility training;Therapeutic activities;Therapeutic exercise;Balance training;Neuromuscular re-education;Patient/family education;Orthotic Fit/Training;Wheelchair mobility training;Manual techniques;Compression bandaging;Passive range of motion;Dry needling;Energy conservation;Taping    PT Next Visit Plan  Progress HEP and continue with strength and balance training, gait training    PT Home Exercise Plan  seated marching, LAQ    Consulted and Agree with Plan of Care  Patient       Patient will benefit from skilled therapeutic intervention in order to improve the following deficits and impairments:  Abnormal gait, Decreased activity tolerance, Decreased balance, Decreased endurance, Decreased knowledge of use of DME, Decreased mobility, Decreased range of motion, Decreased safety awareness, Decreased strength, Difficulty walking, Hypomobility, Increased fascial restricitons, Impaired perceived functional ability, Impaired flexibility, Increased muscle spasms, Impaired sensation, Improper body mechanics, Postural dysfunction, Pain  Visit Diagnosis: Unsteadiness on feet  Muscle weakness (generalized)  Other abnormalities of gait and mobility     Problem List Patient Active Problem List   Diagnosis Date Noted  . Malnutrition of moderate degree 03/31/2017  . Acute  respiratory failure with hypoxia ([place]) 03/30/2017  . AKI (acute kidney injury) ([place]) 07/15/2016  . Protein-calorie malnutrition, severe 02/08/2016  . Primary cancer of right upper lobe of lung ([place]) 11/20/2015  . Abnormal CT lung screening 10/17/2015  . Personal history of tobacco use, presenting hazards to health 10/15/2015  . Chronic vulvitis 09/26/2014  . Allergic  reaction 09/26/2014  . Carotid stenosis 08/30/2014  . Cervical nerve root disorder 08/10/2014  . CAD in native artery 08/10/2014  . B12 deficiency 08/10/2014  . Back ache 08/10/2014  . Bronchitis, chronic ([place]) 08/10/2014  . Diabetes mellitus with polyneuropathy ([place]) 08/10/2014  . Can't get food down 08/10/2014  . Eczema of external ear 08/10/2014  . Accumulation of fluid in tissues 08/10/2014  . Gout 08/10/2014  . Adult hypothyroidism 08/10/2014  . Mononeuritis 08/10/2014  . Muscle ache 08/10/2014  . Disorder of peripheral nervous system 08/10/2014  . Lesion of vulva 08/10/2014  . Cervical spondylosis with radiculopathy 10/19/2013  . COPD exacerbation ([place]) 03/29/2013  . CAD (coronary artery disease) 06/22/2011  . COPD (chronic obstructive pulmonary disease) with emphysema ([place]) 03/07/2010  . CHEST PAIN UNSPECIFIED 07/22/2009  . HLD (hyperlipidemia) 04/01/2009  . Malaise and fatigue 04/01/2009  . Hyperlipidemia 01/18/2009  . TOBACCO ABUSE 01/18/2009  . HYPERTENSION, BENIGN 01/18/2009  . CLAUDICATION 01/18/2009  . Pain in limb 01/18/2009  . CAFL (chronic airflow limitation) ([place]) 01/20/2007  . Late effects of cerebrovascular disease 01/10/2007  . Essential (primary) hypertension 12/22/2006   Harriet Masson, SPT  This entire session was performed under direct supervision and direction of a licensed therapist/therapist assistant . I have personally read, edited and approve of the note as written. Hillis Range, PT, DPT 11/15/17 11:42 AM   [place] MAIN [facility] SERVICES [address] [place], [place], [address] Phone: [phone]   Fax:  [phone]  Name: TIKIA SKILTON MRN: [id number] Date of Birth: 1940-10-07

## 2017-11-16 ENCOUNTER — Ambulatory Visit: Payer: Medicare Other | Admitting: Physical Therapy

## 2017-11-16 ENCOUNTER — Encounter: Payer: Self-pay | Admitting: Physical Therapy

## 2017-11-16 DIAGNOSIS — R2681 Unsteadiness on feet: Secondary | ICD-10-CM | POA: Diagnosis not present

## 2017-11-16 DIAGNOSIS — R2689 Other abnormalities of gait and mobility: Secondary | ICD-10-CM | POA: Diagnosis not present

## 2017-11-16 DIAGNOSIS — M6281 Muscle weakness (generalized): Secondary | ICD-10-CM

## 2017-11-16 NOTE — Therapy (Addendum)
[place] MAIN [facility] SERVICES [address] [place], [place], [address] Phone: [phone]   Fax:  [phone]  Physical Therapy Treatment  Patient Details  Name: [name] [name] MRN: [id number] Date of Birth: May 03, 1940 Referring Provider: Jerrol Banana, MD   Encounter Date: 11/16/2017  PT End of Session - 11/16/17 1525    Visit Number  9    Number of Visits  17    Date for PT Re-Evaluation  12/07/17    Authorization Type  9/10 progress note (start of reporting period 7/9)    PT Start Time  1515    PT Stop Time  1600    PT Time Calculation (min)  45 min    Equipment Utilized During Treatment  Gait belt    Activity Tolerance  Patient tolerated treatment well;Patient limited by fatigue    Behavior During Therapy  [facility] for tasks assessed/performed       Past Medical History:  Diagnosis Date  . Abnormal CT lung screening 10/17/2015  . COPD (chronic obstructive pulmonary disease) ([place])   . Coronary artery disease, non-occlusive    a. cath 2006: min nonobs CAD; b. cath 12/2010: cath LAD 50%, RCA 60%; c. 08/2013: Minimal luminal irregs, right dominant system with no significant CAD, diffuse luminal irregs noted. Normal EF 55%, no AS or MS.   . Diabetes mellitus   . Hyperlipemia    Followed by Dr. Rosanna Randy  . Hypertension   . Lung cancer ([place])   . Macular degeneration    rt  . Personal history of tobacco use, presenting hazards to health 10/15/2015  . Pneumonia    hx  . Shortness of breath   . Stroke [facility])     Past Surgical History:  Procedure Laterality Date  . ABDOMINAL HYSTERECTOMY    . ANTERIOR CERVICAL DECOMP/DISCECTOMY FUSION N/A 10/19/2013   Procedure: CERVICAL FIVE-SIX ANTERIOR CERVICAL DECOMPRESSION WITH FUSION INTERBODY PROSTHESIS PLATING AND PEEK CAGE;  Surgeon: Ophelia Charter, MD;  Location: [place] NEURO ORS;  Service: Neurosurgery;  Laterality: N/A;  . BACK SURGERY  80's  . BREAST CYST EXCISION Left    left negative   .  CARDIAC CATHETERIZATION  05/2004  . CATARACT EXTRACTION Left   . CHOLECYSTECTOMY    . ENDARTERECTOMY Left 10/24/2014   Procedure: ENDARTERECTOMY CAROTID;  Surgeon: Algernon Huxley, MD;  Location: [facility] ORS;  Service: Vascular;  Laterality: Left;  . ENDOBRONCHIAL ULTRASOUND N/A 11/14/2015   Procedure: ENDOBRONCHIAL ULTRASOUND;  Surgeon: [name], MD;  Location: [facility] ORS;  Service: Cardiopulmonary;  Laterality: N/A;  . PERIPHERAL VASCULAR CATHETERIZATION N/A 12/04/2015   Procedure: Glori Luis Cath Insertion;  Surgeon: Algernon Huxley, MD;  Location: [place] CV LAB;  Service: Cardiovascular;  Laterality: N/A;  . PORTA CATH REMOVAL N/A 05/17/2017   Procedure: PORTA CATH REMOVAL;  Surgeon: Algernon Huxley, MD;  Location: [place] CV LAB;  Service: Cardiovascular;  Laterality: N/A;  . TONSILLECTOMY AND ADENOIDECTOMY    . VESICOVAGINAL FISTULA CLOSURE W/ TAH      There were no vitals filed for this visit.  Subjective Assessment - 11/16/17 1523    Subjective  Patient reports she is doing okay today; had a good weekend visiting her sister. Pt denies any pain or new falls today.    Pertinent History  Pt presents with reports of imbalance and BLE weakness. Pt reports that she had HHPT ~1 year ago and since has become more weak.  Pt reports 6 falls in  the past 6 months.  Pt reports she loses her balance due to her RLE giving way, causing her to fall.  Pt with h/o stroke but denies it affecting one side more than the other.  Pt with h/o lung cancer with last treatment ~2 years ago.  Pt reports 8/10 LBP which she attributes to falls in the past.  Pt is able to ascend/descend steps but this is challenging.   Pt has a ramp but she still goes up the steps.  Husband does the driving, grocery shopping.  Pt helps with cooking and cleaning.  Pt independent with showering and dressing.  Pt ambulates with her SPC at all times.  Pt checks her BP, pulse, O2, blood glucose each day.  She also checks the bottom of her feet 1x/wk  due to her peripheral neuropathy (addressed this session to check 1x/day).     How long can you sit comfortably?  no issues    How long can you stand comfortably?  10 minutes    How long can you walk comfortably?  household distances    Patient Stated Goals  Pt would like to be able to walk farther, no falls, up and down steps    Currently in Pain?  No/denies       Treatment  1/2 foam roll in // bars, flat side up: AP rocks x2 min, CGA for safety with VCs for proper technique and utilizing ankle strategy when rocking Maintaining balance in center of roll x30 sec holds x2 bouts without UE support, CGA for safety with VCs for utilizing ankle strategy and avoid forward trunk lean Tandem stance x 30 sec hold x 2 sets each foot in front, unsupported, CGA for safety with min A for minor LOB, VCs for shifting weight to maintain balance  Forward/backward step over 1/2 bolster x 10 reps each direction with faded UE support, CGA for safety with VCs to increase step length  Side stepping over bolster x10 reps each direction with faded UE support, CGA for safety with VCs for taking a bigger step for enough space for both feet on each side of the bolster  Standing on airex pad: Toe taps onto white step x15 reps bilaterally, CGA for safety without UE support, VCs for foot placement and sequencing of stepping as well as to avoid posterior trunk lean  Standing with 2# ankle weight: -Heel raises x20 bilaterally -Marches x15 reps bilaterally -Hip abduction x15 bilaterally -Hip extension x15 bilaterally VCs required for proper technique and positioning throughout all exercises; supervision for safety inside // bars                        PT Education - 11/16/17 1524    Education Details  exercise form/technique    Person(s) Educated  Patient    Methods  Explanation;Demonstration;Verbal cues    Comprehension  Verbalized understanding;Returned demonstration;Verbal cues required;Need  further instruction       PT Short Term Goals - 10/12/17 1617      PT SHORT TERM GOAL #1   Title  Pt will independently complete HEP at least 4 days/wk for improved carryover between sessions    Time  2    Period  Weeks    Status  New      PT SHORT TERM GOAL #2   Title  Pt will inspect her feet at least 1x/day for proper foot care due to peripheral neuropathy    Baseline  1x/wk  Time  2    Period  Weeks    Status  New        PT Long Term Goals - 10/12/17 1618      PT LONG TERM GOAL #1   Title  Pt's ABC scale will improve to at least 50% to demonstrate decreased perception of imbalance    Baseline  20%    Time  8    Period  Weeks    Status  New      PT LONG TERM GOAL #2   Title  Pt's TUG time (with SPC) will decrease to less than or equal to 20 seconds to demonstrate improved mobility    Baseline  32 seconds    Time  8    Period  Weeks    Status  New      PT LONG TERM GOAL #3   Title  Pt's Berg Balance Test will improve to at least 42/56 to demonstrate decreased risk of falling    Baseline  23/56    Time  8    Period  Weeks    Status  New      PT LONG TERM GOAL #4   Title  Pt's 23mWT (with SPC) will improve to at least 0.6 m/s to demonstrate improved gait speed in the home in community    Baseline  0.26 m/s    Time  6    Period  Weeks    Status  New            Plan - 11/16/17 1604    Clinical Impression Statement  Patient tolerated therapy session well today. Patient performed static balance challenges with compliant surfaces as well as stepping activities; required CGA-min A to prevent LOB with VCs for proper technique and positioning. Pt performed standing strengthening exercises with 2# ankle weights; required VCs for proper technique and maintaining upright posture throughout exercises. Pt did require seated rest breaks secondary to fatigue throughout the session between many activities. Pt will benefit from additional skilled PT intervention for  improvements in balance, strength, and gait safety.      Rehab Potential  Good    PT Frequency  2x / week    PT Duration  8 weeks    PT Treatment/Interventions  ADLs/Self Care Home Management;Aquatic Therapy;Cryotherapy;Electrical Stimulation;Iontophoresis 4mg /ml Dexamethasone;Moist Heat;Traction;Ultrasound;DME Instruction;Gait training;Stair training;Functional mobility training;Therapeutic activities;Therapeutic exercise;Balance training;Neuromuscular re-education;Patient/family education;Orthotic Fit/Training;Wheelchair mobility training;Manual techniques;Compression bandaging;Passive range of motion;Dry needling;Energy conservation;Taping    PT Next Visit Plan  Progress HEP and continue with strength and balance training, gait training    PT Home Exercise Plan  seated marching, LAQ    Consulted and Agree with Plan of Care  Patient       Patient will benefit from skilled therapeutic intervention in order to improve the following deficits and impairments:  Abnormal gait, Decreased activity tolerance, Decreased balance, Decreased endurance, Decreased knowledge of use of DME, Decreased mobility, Decreased range of motion, Decreased safety awareness, Decreased strength, Difficulty walking, Hypomobility, Increased fascial restricitons, Impaired perceived functional ability, Impaired flexibility, Increased muscle spasms, Impaired sensation, Improper body mechanics, Postural dysfunction, Pain  Visit Diagnosis: Unsteadiness on feet  Muscle weakness (generalized)  Other abnormalities of gait and mobility     Problem List Patient Active Problem List   Diagnosis Date Noted  . Malnutrition of moderate degree 03/31/2017  . Acute respiratory failure with hypoxia ([place]) 03/30/2017  . AKI (acute kidney injury) ([place]) 07/15/2016  . Protein-calorie malnutrition, severe 02/08/2016  . Primary  cancer of right upper lobe of lung ([place]) 11/20/2015  . Abnormal CT lung screening 10/17/2015  . Personal history  of tobacco use, presenting hazards to health 10/15/2015  . Chronic vulvitis 09/26/2014  . Allergic reaction 09/26/2014  . Carotid stenosis 08/30/2014  . Cervical nerve root disorder 08/10/2014  . CAD in native artery 08/10/2014  . B12 deficiency 08/10/2014  . Back ache 08/10/2014  . Bronchitis, chronic ([place]) 08/10/2014  . Diabetes mellitus with polyneuropathy ([place]) 08/10/2014  . Can't get food down 08/10/2014  . Eczema of external ear 08/10/2014  . Accumulation of fluid in tissues 08/10/2014  . Gout 08/10/2014  . Adult hypothyroidism 08/10/2014  . Mononeuritis 08/10/2014  . Muscle ache 08/10/2014  . Disorder of peripheral nervous system 08/10/2014  . Lesion of vulva 08/10/2014  . Cervical spondylosis with radiculopathy 10/19/2013  . COPD exacerbation ([place]) 03/29/2013  . CAD (coronary artery disease) 06/22/2011  . COPD (chronic obstructive pulmonary disease) with emphysema ([place]) 03/07/2010  . CHEST PAIN UNSPECIFIED 07/22/2009  . HLD (hyperlipidemia) 04/01/2009  . Malaise and fatigue 04/01/2009  . Hyperlipidemia 01/18/2009  . TOBACCO ABUSE 01/18/2009  . HYPERTENSION, BENIGN 01/18/2009  . CLAUDICATION 01/18/2009  . Pain in limb 01/18/2009  . CAFL (chronic airflow limitation) ([place]) 01/20/2007  . Late effects of cerebrovascular disease 01/10/2007  . Essential (primary) hypertension 12/22/2006   Harriet Masson, SPT  This entire session was performed under direct supervision and direction of a licensed therapist/therapist assistant . I have personally read, edited and approve of the note as written. Collie Siad PT, DPT 11/16/2017, 4:13 PM  [place] MAIN [facility] SERVICES [address] [place], [place], [address] Phone: [phone]   Fax:  [phone]  Name: [name] [name] MRN: [id number] Date of Birth: [birth year]

## 2017-11-18 ENCOUNTER — Ambulatory Visit: Payer: Medicare Other

## 2017-11-18 DIAGNOSIS — R2681 Unsteadiness on feet: Secondary | ICD-10-CM

## 2017-11-18 DIAGNOSIS — R2689 Other abnormalities of gait and mobility: Secondary | ICD-10-CM

## 2017-11-18 DIAGNOSIS — M6281 Muscle weakness (generalized): Secondary | ICD-10-CM

## 2017-11-18 NOTE — Therapy (Signed)
[place] MAIN [facility] SERVICES [address] [place], [place], [address] Phone: [phone]   Fax:  [phone]  Physical Therapy Treatment Physical Therapy Progress Note   Dates of reporting period  10/12/17  to   11/18/17  Patient Details  Name: [name] [name] MRN: [id number] Date of Birth: 1940/10/21 Referring Provider: Jerrol Banana, MD   Encounter Date: 11/18/2017  PT End of Session - 11/18/17 1358    Visit Number  10    Number of Visits  17    Date for PT Re-Evaluation  12/07/17    Authorization Type  10/10 progress note -next 1/10  (start of reporting period 11/18/17)    PT Start Time  1259    PT Stop Time  1345    PT Time Calculation (min)  46 min    Equipment Utilized During Treatment  Gait belt    Activity Tolerance  Patient tolerated treatment well;Patient limited by fatigue    Behavior During Therapy  WFL for tasks assessed/performed       Past Medical History:  Diagnosis Date  . Abnormal CT lung screening 10/17/2015  . COPD (chronic obstructive pulmonary disease) ([place])   . Coronary artery disease, non-occlusive    a. cath 2006: min nonobs CAD; b. cath 12/2010: cath LAD 50%, RCA 60%; c. 08/2013: Minimal luminal irregs, right dominant system with no significant CAD, diffuse luminal irregs noted. Normal EF 55%, no AS or MS.   . Diabetes mellitus   . Hyperlipemia    Followed by Dr. Rosanna Randy  . Hypertension   . Lung cancer ([place])   . Macular degeneration    rt  . Personal history of tobacco use, presenting hazards to health 10/15/2015  . Pneumonia    hx  . Shortness of breath   . Stroke [facility])     Past Surgical History:  Procedure Laterality Date  . ABDOMINAL HYSTERECTOMY    . ANTERIOR CERVICAL DECOMP/DISCECTOMY FUSION N/A 10/19/2013   Procedure: CERVICAL FIVE-SIX ANTERIOR CERVICAL DECOMPRESSION WITH FUSION INTERBODY PROSTHESIS PLATING AND PEEK CAGE;  Surgeon: Ophelia Charter, MD;  Location: [facility] OR[facility];  Service:  Neurosurgery;  Laterality: N/A;  . BACK SURGERY  80's  . BREAST CYST EXCISION Left    left negative   . CARDIAC CATHETERIZATION  05/2004  . CATARACT EXTRACTION Left   . CHOLECYSTECTOMY    . ENDARTERECTOMY Left 10/24/2014   Procedure: ENDARTERECTOMY CAROTID;  Surgeon: Algernon Huxley, MD;  Location: [facility] ORS;  Service: Vascular;  Laterality: Left;  . ENDOBRONCHIAL ULTRASOUND N/A 11/14/2015   Procedure: ENDOBRONCHIAL ULTRASOUND;  Surgeon: [name], MD;  Location: [facility] ORS;  Service: Cardiopulmonary;  Laterality: N/A;  . PERIPHERAL VASCULAR CATHETERIZATION N/A 12/04/2015   Procedure: Glori Luis Cath Insertion;  Surgeon: Algernon Huxley, MD;  Location: [place] CV LAB;  Service: Cardiovascular;  Laterality: N/A;  . PORTA CATH REMOVAL N/A 05/17/2017   Procedure: PORTA CATH REMOVAL;  Surgeon: Algernon Huxley, MD;  Location: [place] CV LAB;  Service: Cardiovascular;  Laterality: N/A;  . TONSILLECTOMY AND ADENOIDECTOMY    . VESICOVAGINAL FISTULA CLOSURE W/ TAH      There were no vitals filed for this visit.  Subjective Assessment - 11/18/17 1302    Subjective  Patient reports not feeling sore today. Denies any fall or LOB since last session. Reports she sometimes trips over her dog but has been doing better. Reports compliance with HEP.     Pertinent History  Pt  presents with reports of imbalance and BLE weakness. Pt reports that she had HHPT ~1 year ago and since has become more weak.  Pt reports 6 falls in the past 6 months.  Pt reports she loses her balance due to her RLE giving way, causing her to fall.  Pt with h/o stroke but denies it affecting one side more than the other.  Pt with h/o lung cancer with last treatment ~2 years ago.  Pt reports 8/10 LBP which she attributes to falls in the past.  Pt is able to ascend/descend steps but this is challenging.   Pt has a ramp but she still goes up the steps.  Husband does the driving, grocery shopping.  Pt helps with cooking and cleaning.  Pt independent  with showering and dressing.  Pt ambulates with her SPC at all times.  Pt checks her BP, pulse, O2, blood glucose each day.  She also checks the bottom of her feet 1x/wk due to her peripheral neuropathy (addressed this session to check 1x/day).     How long can you sit comfortably?  no issues    How long can you stand comfortably?  10 minutes    How long can you walk comfortably?  household distances    Patient Stated Goals  Pt would like to be able to walk farther, no falls, up and down steps    Currently in Pain?  No/denies       Inspect her feet ABC: 45% TUG: 48 seconds no AD; 31 seconds with AD:  BERG: 33/56 10 MWT: 24 with AD ; 33 seconds first trial. =.42 m/s       Forward/backward step over orange hurdle x 10 reps each direction with faded UE support, CGA for safety with VCs to increase step length   Side stepping over orange hurdlex10 reps each direction with faded UE support, CGA for safety with VCs for taking a bigger step for enough space for both feet on each side of the bolster   Standing on airex pad: Toe taps onto white step x15 reps bilaterally, CGA for safety without UE support, VCs for foot placement and sequencing of stepping as well as to avoid posterior trunk lean [facility] PT Assessment - 11/18/17 0001      Berg Balance Test   Sit to Stand  Able to stand  independently using hands    Standing Unsupported  Able to stand 2 minutes with supervision    Sitting with Back Unsupported but Feet Supported on Floor or Stool  Able to sit safely and securely 2 minutes    Stand to Sit  Sits safely with minimal use of hands    Transfers  Able to transfer safely, definite need of hands    Standing Unsupported with Eyes Closed  Able to stand 3 seconds    Standing Ubsupported with Feet Together  Able to place feet together independently and stand for 1 minute with supervision    From Standing, Reach Forward with Outstretched Arm  Can reach forward >12 cm safely (5")    From Standing  Position, Pick up Object from Floor  Unable to pick up shoe, but reaches 2-5 cm (1-2") from shoe and balances independently    From Standing Position, Turn to Look Behind Over each Shoulder  Turn sideways only but maintains balance    Turn 360 Degrees  Needs close supervision or verbal cueing    Standing Unsupported, Alternately Place Feet on Step/Stool  Able to complete 4  steps without aid or supervision    Standing Unsupported, One Foot in Front  Needs help to step but can hold 15 seconds    Standing on One Leg  Unable to try or needs assist to prevent fall    Total Score  33       Static marches with half foam roller between feet to promote wider BOS no UE support 10x each leg.   airex pad: vertical head nodes 60 seconds, horizontal head turns 60 seconds, initially unstable with improved trunk control of COM with duration of time  Patient's condition has the potential to improve in response to therapy. Maximum improvement is yet to be obtained. The anticipated improvement is attainable and reasonable in a generally predictable time. Start date of reporting period 10/12/17 end date of reporting period 11/18/17. Patient reports that she is improving with her balance and feels stronger but is still struggling with steps, balance at home, and prolonged walking.                        PT Education - 11/18/17 1357    Education Details  goal progression, exercise form/technique    Person(s) Educated  Patient    Methods  Explanation;Demonstration;Verbal cues    Comprehension  Verbalized understanding;Returned demonstration       PT Short Term Goals - 11/18/17 1402      PT SHORT TERM GOAL #1   Title  Pt will independently complete HEP at least 4 days/wk for improved carryover between sessions    Baseline  reports compliance with HEP     Time  2    Period  Weeks    Status  Partially Met      PT SHORT TERM GOAL #2   Title  Pt will inspect her feet at least 1x/day for proper  foot care due to peripheral neuropathy    Baseline  increasing frequency     Time  2    Period  Weeks    Status  On-going        PT Long Term Goals - 11/18/17 1306      PT LONG TERM GOAL #1   Title  Pt's ABC scale will improve to at least 50% to demonstrate decreased perception of imbalance    Baseline  20%; 8/15: 45%    Time  8    Period  Weeks    Status  New      PT LONG TERM GOAL #2   Title  Pt's TUG time (with SPC) will decrease to less than or equal to 20 seconds to demonstrate improved mobility    Baseline  32 seconds; 8/15: 31 seconds    Time  8    Period  Weeks    Status  Partially Met      PT LONG TERM GOAL #3   Title  Pt's Berg Balance Test will improve to at least 42/56 to demonstrate decreased risk of falling    Baseline  23/56; 8/15: 33/56     Time  8    Period  Weeks    Status  Partially Met      PT LONG TERM GOAL #4   Title  Pt's 49mT (with SPC) will improve to at least 0.6 m/s to demonstrate improved gait speed in the home in community    Baseline  0.26 m/s; 8/15: .42 m/s with AD     Time  6    Period  Weeks    Status  Partially Met            Plan - 11/18/17 1402    Clinical Impression Statement  Patient's goals were re-assessed today with progression of scores on ABC to 45%,  TUG to 21 seconds with AD, BERG 33/56 (10 point increase) , and 10 MWT=.42 m/s with AD. Patient fatigues with ambulation with decreased hip and knee flexion with fatigue resulting in foot drag and poor clearance of feet. Patient's condition has the potential to improve in response to therapy. Frequent cueing for widening BOS required to allow patient improved stability with standing tasks. Maximum improvement is yet to be obtained. The anticipated improvement is attainable and reasonable in a generally predictable time.Pt will benefit from additional skilled PT intervention for improvements in balance, strength, and gait safety    Rehab Potential  Good    PT Frequency  2x /  week    PT Duration  8 weeks    PT Treatment/Interventions  ADLs/Self Care Home Management;Aquatic Therapy;Cryotherapy;Electrical Stimulation;Iontophoresis 21m/ml Dexamethasone;Moist Heat;Traction;Ultrasound;DME Instruction;Gait training;Stair training;Functional mobility training;Therapeutic activities;Therapeutic exercise;Balance training;Neuromuscular re-education;Patient/family education;Orthotic Fit/Training;Wheelchair mobility training;Manual techniques;Compression bandaging;Passive range of motion;Dry needling;Energy conservation;Taping    PT Next Visit Plan  Progress HEP and continue with strength and balance training, gait training    PT Home Exercise Plan  seated marching, LAQ    Consulted and Agree with Plan of Care  Patient       Patient will benefit from skilled therapeutic intervention in order to improve the following deficits and impairments:  Abnormal gait, Decreased activity tolerance, Decreased balance, Decreased endurance, Decreased knowledge of use of DME, Decreased mobility, Decreased range of motion, Decreased safety awareness, Decreased strength, Difficulty walking, Hypomobility, Increased fascial restricitons, Impaired perceived functional ability, Impaired flexibility, Increased muscle spasms, Impaired sensation, Improper body mechanics, Postural dysfunction, Pain  Visit Diagnosis: Unsteadiness on feet  Muscle weakness (generalized)  Other abnormalities of gait and mobility     Problem List Patient Active Problem List   Diagnosis Date Noted  . Malnutrition of moderate degree 03/31/2017  . Acute respiratory failure with hypoxia ([place] 03/30/2017  . AKI (acute kidney injury) ([place] 07/15/2016  . Protein-calorie malnutrition, severe 02/08/2016  . Primary cancer of right upper lobe of lung ([facility] 11/20/2015  . Abnormal CT lung screening 10/17/2015  . Personal history of tobacco use, presenting hazards to health 10/15/2015  . Chronic vulvitis 09/26/2014  . Allergic  reaction 09/26/2014  . Carotid stenosis 08/30/2014  . Cervical nerve root disorder 08/10/2014  . CAD in native artery 08/10/2014  . B12 deficiency 08/10/2014  . Back ache 08/10/2014  . Bronchitis, chronic ([place] 08/10/2014  . Diabetes mellitus with polyneuropathy ([facility] [place] 08/10/2014  . Can't get food down 08/10/2014  . Eczema of external ear 08/10/2014  . Accumulation of fluid in tissues 08/10/2014  . Gout 08/10/2014  . Adult hypothyroidism 08/10/2014  . Mononeuritis 08/10/2014  . Muscle ache 08/10/2014  . Disorder of peripheral nervous system 08/10/2014  . Lesion of vulva 08/10/2014  . Cervical spondylosis with radiculopathy 10/19/2013  . COPD exacerbation ([place] 03/29/2013  . CAD (coronary artery disease) 06/22/2011  . COPD (chronic obstructive pulmonary disease) with emphysema ([facility] 03/07/2010  . CHEST PAIN UNSPECIFIED 07/22/2009  . HLD (hyperlipidemia) 04/01/2009  . Malaise and fatigue 04/01/2009  . Hyperlipidemia 01/18/2009  . TOBACCO ABUSE 01/18/2009  . HYPERTENSION, BENIGN 01/18/2009  . CLAUDICATION 01/18/2009  . Pain in limb 01/18/2009  . CAFL (chronic airflow limitation) ([facility] 01/20/2007  . Late  effects of cerebrovascular disease 01/10/2007  . Essential (primary) hypertension 12/22/2006   Janna Arch, PT, DPT   11/18/2017, 2:03 PM  [place] MAIN [facility] SERVICES [address] [place], [place], [address] Phone: [phone]   Fax:  [phone]  Name: [name] [name] MRN: [id number] Date of Birth: Nov 16, 1940

## 2017-11-23 ENCOUNTER — Ambulatory Visit: Payer: Medicare Other | Admitting: Physical Therapy

## 2017-11-23 ENCOUNTER — Encounter: Payer: Self-pay | Admitting: Physical Therapy

## 2017-11-23 DIAGNOSIS — R0602 Shortness of breath: Secondary | ICD-10-CM | POA: Diagnosis not present

## 2017-11-23 DIAGNOSIS — J449 Chronic obstructive pulmonary disease, unspecified: Secondary | ICD-10-CM | POA: Diagnosis not present

## 2017-11-23 DIAGNOSIS — M6281 Muscle weakness (generalized): Secondary | ICD-10-CM | POA: Diagnosis not present

## 2017-11-23 DIAGNOSIS — R2681 Unsteadiness on feet: Secondary | ICD-10-CM

## 2017-11-23 DIAGNOSIS — R2689 Other abnormalities of gait and mobility: Secondary | ICD-10-CM | POA: Diagnosis not present

## 2017-11-23 NOTE — Therapy (Addendum)
[place] MAIN [facility] SERVICES [address] [place], [place], [address] Phone: [phone]   Fax:  [phone]  Physical Therapy Treatment  Patient Details  Name: [name] [name] MRN: [id number] Date of Birth: [birth year] Referring Provider: Jerrol Banana, MD   Encounter Date: 11/23/2017  PT End of Session - 11/23/17 1521    Visit Number  11    Number of Visits  17    Date for PT Re-Evaluation  12/07/17    Authorization Type  1/10 progress note -next 2/10  (start of reporting period 11/18/17)    PT Start Time  1515    PT Stop Time  1600    PT Time Calculation (min)  45 min    Equipment Utilized During Treatment  Gait belt    Activity Tolerance  Patient tolerated treatment well;Patient limited by fatigue    Behavior During Therapy  [facility] for tasks assessed/performed       Past Medical History:  Diagnosis Date  . Abnormal CT lung screening 10/17/2015  . COPD (chronic obstructive pulmonary disease) ([place])   . Coronary artery disease, non-occlusive    a. cath 2006: min nonobs CAD; b. cath 12/2010: cath LAD 50%, RCA 60%; c. 08/2013: Minimal luminal irregs, right dominant system with no significant CAD, diffuse luminal irregs noted. Normal EF 55%, no AS or MS.   . Diabetes mellitus   . Hyperlipemia    Followed by Dr. Rosanna Randy  . Hypertension   . Lung cancer ([place])   . Macular degeneration    rt  . Personal history of tobacco use, presenting hazards to health 10/15/2015  . Pneumonia    hx  . Shortness of breath   . Stroke [facility])     Past Surgical History:  Procedure Laterality Date  . ABDOMINAL HYSTERECTOMY    . ANTERIOR CERVICAL DECOMP/DISCECTOMY FUSION N/A 10/19/2013   Procedure: CERVICAL FIVE-SIX ANTERIOR CERVICAL DECOMPRESSION WITH FUSION INTERBODY PROSTHESIS PLATING AND PEEK CAGE;  Surgeon: Ophelia Charter, MD;  Location: [place] NEURO ORS;  Service: Neurosurgery;  Laterality: N/A;  . BACK SURGERY  80's  . BREAST CYST EXCISION Left     left negative   . CARDIAC CATHETERIZATION  05/2004  . CATARACT EXTRACTION Left   . CHOLECYSTECTOMY    . ENDARTERECTOMY Left 10/24/2014   Procedure: ENDARTERECTOMY CAROTID;  Surgeon: Algernon Huxley, MD;  Location: [facility] ORS;  Service: Vascular;  Laterality: Left;  . ENDOBRONCHIAL ULTRASOUND N/A 11/14/2015   Procedure: ENDOBRONCHIAL ULTRASOUND;  Surgeon: [name], MD;  Location: [facility] ORS;  Service: Cardiopulmonary;  Laterality: N/A;  . PERIPHERAL VASCULAR CATHETERIZATION N/A 12/04/2015   Procedure: Glori Luis Cath Insertion;  Surgeon: Algernon Huxley, MD;  Location: [place] CV LAB;  Service: Cardiovascular;  Laterality: N/A;  . PORTA CATH REMOVAL N/A 05/17/2017   Procedure: PORTA CATH REMOVAL;  Surgeon: Algernon Huxley, MD;  Location: [place] CV LAB;  Service: Cardiovascular;  Laterality: N/A;  . TONSILLECTOMY AND ADENOIDECTOMY    . VESICOVAGINAL FISTULA CLOSURE W/ TAH      There were no vitals filed for this visit.  Subjective Assessment - 11/23/17 1517    Subjective  Patient reports she is doing well and has been staying out of trouble. She denies any pain or new falls since last visit.     Pertinent History  Pt presents with reports of imbalance and BLE weakness. Pt reports that she had HHPT ~1 year ago and since has become more weak.  Pt reports 6 falls in the past 6 months.  Pt reports she loses her balance due to her RLE giving way, causing her to fall.  Pt with h/o stroke but denies it affecting one side more than the other.  Pt with h/o lung cancer with last treatment ~2 years ago.  Pt reports 8/10 LBP which she attributes to falls in the past.  Pt is able to ascend/descend steps but this is challenging.   Pt has a ramp but she still goes up the steps.  Husband does the driving, grocery shopping.  Pt helps with cooking and cleaning.  Pt independent with showering and dressing.  Pt ambulates with her SPC at all times.  Pt checks her BP, pulse, O2, blood glucose each day.  She also checks the  bottom of her feet 1x/wk due to her peripheral neuropathy (addressed this session to check 1x/day).     How long can you sit comfortably?  no issues    How long can you stand comfortably?  10 minutes    How long can you walk comfortably?  household distances    Patient Stated Goals  Pt would like to be able to walk farther, no falls, up and down steps    Currently in Pain?  No/denies        Treatment Warm up on Nustep level 2 x3 min while taking history   BLE leg press 90# 3x12 reps, VCs for slowing down return and positioning of feet  4 inch step ups x1 min forwards/back down, CGA for safety with VCs for increasing speed to work on cardiovascular endurance 4 inch side step up and over x10 reps each direction, CGA for safety with VCs for sequencing and taking big enough steps to get each foot across the step  Forward/backward step over orange 6 inch hurdle x10 reps, CGA for safety with min A for one LOB when hitting the hurdle, VCs for lifting the foot up high enough to clear the hurdle Side step over orange 6 inch hurdle x10 reps each direction, CGA for safety with VCs to decrease UE support and lifting each foot up high enough   Balloon passes standing on purple airex pad x2 min bouts x3 bouts, CGA-min A for LOB posteriorly with VCs for not holding onto // bar and utilizing both UEs for balloon tap  Standing 2# ankle weights: -Alternating marching without UE support x10 reps each leg -Hip extension with UE support x10 reps each leg -Heel raises x10 reps bilaterally without UE support -Hip abduction x10 reps each leg with single UE support VCs required for proper technique and positioning  Seated 2# ankle weights: -Hamstring curls with green tband x10 reps each leg -Hip adduction with ball x10 reps with 5 sec holds VCs required for proper technique and positioning    PT Education - 11/23/17 1521    Education Details  exercise form/technique, LE strengthening    Person(s)  Educated  Patient    Methods  Explanation;Demonstration;Verbal cues    Comprehension  Verbalized understanding;Returned demonstration;Need further instruction       PT Short Term Goals - 11/18/17 1402      PT SHORT TERM GOAL #1   Title  Pt will independently complete HEP at least 4 days/wk for improved carryover between sessions    Baseline  reports compliance with HEP     Time  2    Period  Weeks    Status  Partially Met  PT SHORT TERM GOAL #2   Title  Pt will inspect her feet at least 1x/day for proper foot care due to peripheral neuropathy    Baseline  increasing frequency     Time  2    Period  Weeks    Status  On-going        PT Long Term Goals - 11/18/17 1306      PT LONG TERM GOAL #1   Title  Pt's ABC scale will improve to at least 50% to demonstrate decreased perception of imbalance    Baseline  20%; 8/15: 45%    Time  8    Period  Weeks    Status  New      PT LONG TERM GOAL #2   Title  Pt's TUG time (with SPC) will decrease to less than or equal to 20 seconds to demonstrate improved mobility    Baseline  32 seconds; 8/15: 31 seconds    Time  8    Period  Weeks    Status  Partially Met      PT LONG TERM GOAL #3   Title  Pt's Berg Balance Test will improve to at least 42/56 to demonstrate decreased risk of falling    Baseline  23/56; 8/15: 33/56     Time  8    Period  Weeks    Status  Partially Met      PT LONG TERM GOAL #4   Title  Pt's 15mT (with SPC) will improve to at least 0.6 m/s to demonstrate improved gait speed in the home in community    Baseline  0.26 m/s; 8/15: .42 m/s with AD     Time  6    Period  Weeks    Status  Partially Met            Plan - 11/23/17 1613    Clinical Impression Statement  Patient tolerated therapy session well. Pt performed balance activities with compliant surfaces as well as working on forwards/backwards stepping over object; required CGA-min A for minor LOB with VCs for proper technique and sequencing. Pt  performed LE strengthening in standing and seated position with 2# ankle weights with decreased rest breaks; required CGA-supervision for safety and VCs for proper technique and form. Pt will continue to benefit from skilled PT intervention for improvements in balance, strength, and gait safety.     Rehab Potential  Good    PT Frequency  2x / week    PT Duration  8 weeks    PT Treatment/Interventions  ADLs/Self Care Home Management;Aquatic Therapy;Cryotherapy;Electrical Stimulation;Iontophoresis 456mml Dexamethasone;Moist Heat;Traction;Ultrasound;DME Instruction;Gait training;Stair training;Functional mobility training;Therapeutic activities;Therapeutic exercise;Balance training;Neuromuscular re-education;Patient/family education;Orthotic Fit/Training;Wheelchair mobility training;Manual techniques;Compression bandaging;Passive range of motion;Dry needling;Energy conservation;Taping    PT Next Visit Plan  Progress HEP and continue with strength and balance training, gait training    PT Home Exercise Plan  seated marching, LAQ    Consulted and Agree with Plan of Care  Patient       Patient will benefit from skilled therapeutic intervention in order to improve the following deficits and impairments:  Abnormal gait, Decreased activity tolerance, Decreased balance, Decreased endurance, Decreased knowledge of use of DME, Decreased mobility, Decreased range of motion, Decreased safety awareness, Decreased strength, Difficulty walking, Hypomobility, Increased fascial restricitons, Impaired perceived functional ability, Impaired flexibility, Increased muscle spasms, Impaired sensation, Improper body mechanics, Postural dysfunction, Pain  Visit Diagnosis: Unsteadiness on feet  Muscle weakness (generalized)  Other abnormalities of gait and mobility  Problem List Patient Active Problem List   Diagnosis Date Noted  . Malnutrition of moderate degree 03/31/2017  . Acute respiratory failure with  hypoxia ([place]) 03/30/2017  . AKI (acute kidney injury) ([place]) 07/15/2016  . Protein-calorie malnutrition, severe 02/08/2016  . Primary cancer of right upper lobe of lung ([place]) 11/20/2015  . Abnormal CT lung screening 10/17/2015  . Personal history of tobacco use, presenting hazards to health 10/15/2015  . Chronic vulvitis 09/26/2014  . Allergic reaction 09/26/2014  . Carotid stenosis 08/30/2014  . Cervical nerve root disorder 08/10/2014  . CAD in native artery 08/10/2014  . B12 deficiency 08/10/2014  . Back ache 08/10/2014  . Bronchitis, chronic ([place]) 08/10/2014  . Diabetes mellitus with polyneuropathy ([place]) 08/10/2014  . Can't get food down 08/10/2014  . Eczema of external ear 08/10/2014  . Accumulation of fluid in tissues 08/10/2014  . Gout 08/10/2014  . Adult hypothyroidism 08/10/2014  . Mononeuritis 08/10/2014  . Muscle ache 08/10/2014  . Disorder of peripheral nervous system 08/10/2014  . Lesion of vulva 08/10/2014  . Cervical spondylosis with radiculopathy 10/19/2013  . COPD exacerbation ([place]) 03/29/2013  . CAD (coronary artery disease) 06/22/2011  . COPD (chronic obstructive pulmonary disease) with emphysema ([place]) 03/07/2010  . CHEST PAIN UNSPECIFIED 07/22/2009  . HLD (hyperlipidemia) 04/01/2009  . Malaise and fatigue 04/01/2009  . Hyperlipidemia 01/18/2009  . TOBACCO ABUSE 01/18/2009  . HYPERTENSION, BENIGN 01/18/2009  . CLAUDICATION 01/18/2009  . Pain in limb 01/18/2009  . CAFL (chronic airflow limitation) ([place]) 01/20/2007  . Late effects of cerebrovascular disease 01/10/2007  . Essential (primary) hypertension 12/22/2006   Harriet Masson, SPT   This entire session was performed under direct supervision and direction of a licensed therapist/therapist assistant . I have personally read, edited and approve of the note as written. Collie Siad PT, DPT 11/24/2017, 9:38 AM  [place] MAIN [facility] SERVICES [address]  [place], [place], [address] Phone: [phone]   Fax:  [phone]  Name: DEBARA KAMPHUIS MRN: [id number] Date of Birth: 1940/06/16

## 2017-11-25 ENCOUNTER — Encounter: Payer: Self-pay | Admitting: Physical Therapy

## 2017-11-25 ENCOUNTER — Ambulatory Visit: Payer: Medicare Other | Admitting: Physical Therapy

## 2017-11-25 DIAGNOSIS — M6281 Muscle weakness (generalized): Secondary | ICD-10-CM | POA: Diagnosis not present

## 2017-11-25 DIAGNOSIS — R2689 Other abnormalities of gait and mobility: Secondary | ICD-10-CM

## 2017-11-25 DIAGNOSIS — R2681 Unsteadiness on feet: Secondary | ICD-10-CM | POA: Diagnosis not present

## 2017-11-25 DIAGNOSIS — J449 Chronic obstructive pulmonary disease, unspecified: Secondary | ICD-10-CM | POA: Diagnosis not present

## 2017-11-25 NOTE — Therapy (Signed)
[place] MAIN [facility] SERVICES [address] [place], [place], [address] Phone: [phone]   Fax:  [phone]  Physical Therapy Treatment  Patient Details  Name: [name] [name] MRN: [id number] Date of Birth: 05/18/1940 Referring Provider: Jerrol Banana, MD   Encounter Date: 11/25/2017  PT End of Session - 11/25/17 1512    Visit Number  12    Number of Visits  17    Date for PT Re-Evaluation  12/07/17    Authorization Type  2/10 progress note (start of reporting period 11/18/17)    PT Start Time  1516    PT Stop Time  1604    PT Time Calculation (min)  48 min    Equipment Utilized During Treatment  Gait belt    Activity Tolerance  Patient tolerated treatment well;Patient limited by fatigue    Behavior During Therapy  [facility] for tasks assessed/performed       Past Medical History:  Diagnosis Date  . Abnormal CT lung screening 10/17/2015  . COPD (chronic obstructive pulmonary disease) ([place])   . Coronary artery disease, non-occlusive    a. cath 2006: min nonobs CAD; b. cath 12/2010: cath LAD 50%, RCA 60%; c. 08/2013: Minimal luminal irregs, right dominant system with no significant CAD, diffuse luminal irregs noted. Normal EF 55%, no AS or MS.   . Diabetes mellitus   . Hyperlipemia    Followed by Dr. Rosanna Randy  . Hypertension   . Lung cancer ([place])   . Macular degeneration    rt  . Personal history of tobacco use, presenting hazards to health 10/15/2015  . Pneumonia    hx  . Shortness of breath   . Stroke [facility])     Past Surgical History:  Procedure Laterality Date  . ABDOMINAL HYSTERECTOMY    . ANTERIOR CERVICAL DECOMP/DISCECTOMY FUSION N/A 10/19/2013   Procedure: CERVICAL FIVE-SIX ANTERIOR CERVICAL DECOMPRESSION WITH FUSION INTERBODY PROSTHESIS PLATING AND PEEK CAGE;  Surgeon: Ophelia Charter, MD;  Location: [place] NEURO ORS;  Service: Neurosurgery;  Laterality: N/A;  . BACK SURGERY  80's  . BREAST CYST EXCISION Left    left negative    . CARDIAC CATHETERIZATION  05/2004  . CATARACT EXTRACTION Left   . CHOLECYSTECTOMY    . ENDARTERECTOMY Left 10/24/2014   Procedure: ENDARTERECTOMY CAROTID;  Surgeon: Algernon Huxley, MD;  Location: [facility] ORS;  Service: Vascular;  Laterality: Left;  . ENDOBRONCHIAL ULTRASOUND N/A 11/14/2015   Procedure: ENDOBRONCHIAL ULTRASOUND;  Surgeon: [name], MD;  Location: [facility] ORS;  Service: Cardiopulmonary;  Laterality: N/A;  . PERIPHERAL VASCULAR CATHETERIZATION N/A 12/04/2015   Procedure: Glori Luis Cath Insertion;  Surgeon: Algernon Huxley, MD;  Location: [place] CV LAB;  Service: Cardiovascular;  Laterality: N/A;  . PORTA CATH REMOVAL N/A 05/17/2017   Procedure: PORTA CATH REMOVAL;  Surgeon: Algernon Huxley, MD;  Location: [place] CV LAB;  Service: Cardiovascular;  Laterality: N/A;  . TONSILLECTOMY AND ADENOIDECTOMY    . VESICOVAGINAL FISTULA CLOSURE W/ TAH      There were no vitals filed for this visit.  Subjective Assessment - 11/25/17 1519    Subjective  Pt reports she has only been completing her HEP on Wednesdays.  No questions or concerns about her HEP.  No pain.  No recent falls.      Pertinent History  Pt presents with reports of imbalance and BLE weakness. Pt reports that she had HHPT ~1 year ago and since has become more weak.  Pt reports 6 falls in the past 6 months.  Pt reports she loses her balance due to her RLE giving way, causing her to fall.  Pt with h/o stroke but denies it affecting one side more than the other.  Pt with h/o lung cancer with last treatment ~2 years ago.  Pt reports 8/10 LBP which she attributes to falls in the past.  Pt is able to ascend/descend steps but this is challenging.   Pt has a ramp but she still goes up the steps.  Husband does the driving, grocery shopping.  Pt helps with cooking and cleaning.  Pt independent with showering and dressing.  Pt ambulates with her SPC at all times.  Pt checks her BP, pulse, O2, blood glucose each day.  She also checks the bottom  of her feet 1x/wk due to her peripheral neuropathy (addressed this session to check 1x/day).     How long can you sit comfortably?  no issues    How long can you stand comfortably?  10 minutes    How long can you walk comfortably?  household distances    Patient Stated Goals  Pt would like to be able to walk farther, no falls, up and down steps    Currently in Pain?  No/denies        TREATMENT   BLE leg press 90# 3x12 reps, VCs for slowing down return and positioning of feet   4 inch step ups x20 forwards/back down, VCs for increasing speed to work on cardiovascular endurance. On first step pt report a "pop" in her L leg posteriorly to her knee. Pt denies pain as she continues.   4 inch side step up and over x10 reps each direction, VCs for increasing speed to work on cardiovascular endurance   Forward walking and stepping over cones x6 lengths in // bars   Sit<>stand without UE support raising 3# bar overhead with cues for speed when coming to standing. 2x10   Balloon passes standing on purple airex pad x2 min bouts x3 bouts, CGA-min A for LOB posteriorly with VCs for not holding onto // bar and utilizing both UEs for balloon tap   Ambulating in hallway with spontaneous cues to turn 180 deg either L or R, discontinued as pt reported pain in distal L medial hamstring region x1 when turning   [place] on airex with vertical and horizontal head turns x10 each direction   Pt in sitting, ice pack applied to distal L medial hamstring region x8 minutes (unbilled). Skin assessment following was WNL.                        PT Education - 11/25/17 1512    Education Details  Exercise technique    Person(s) Educated  Patient    Methods  Explanation;Demonstration;Verbal cues    Comprehension  Verbalized understanding;Returned demonstration;Verbal cues required       PT Short Term Goals - 11/18/17 1402      PT SHORT TERM GOAL #1   Title  Pt will independently  complete HEP at least 4 days/wk for improved carryover between sessions    Baseline  reports compliance with HEP     Time  2    Period  Weeks    Status  Partially Met      PT SHORT TERM GOAL #2   Title  Pt will inspect her feet at least 1x/day for proper foot care due to peripheral neuropathy  Baseline  increasing frequency     Time  2    Period  Weeks    Status  On-going        PT Long Term Goals - 11/18/17 1306      PT LONG TERM GOAL #1   Title  Pt's ABC scale will improve to at least 50% to demonstrate decreased perception of imbalance    Baseline  20%; 8/15: 45%    Time  8    Period  Weeks    Status  New      PT LONG TERM GOAL #2   Title  Pt's TUG time (with SPC) will decrease to less than or equal to 20 seconds to demonstrate improved mobility    Baseline  32 seconds; 8/15: 31 seconds    Time  8    Period  Weeks    Status  Partially Met      PT LONG TERM GOAL #3   Title  Pt's Berg Balance Test will improve to at least 42/56 to demonstrate decreased risk of falling    Baseline  23/56; 8/15: 33/56     Time  8    Period  Weeks    Status  Partially Met      PT LONG TERM GOAL #4   Title  Pt's 42mT (with SPC) will improve to at least 0.6 m/s to demonstrate improved gait speed in the home in community    Baseline  0.26 m/s; 8/15: .42 m/s with AD     Time  6    Period  Weeks    Status  Partially Met            Plan - 11/25/17 1522    Clinical Impression Statement  Pt demonstrated fatigue with challenges to her cardiovascular system.  Pt demonstrated difficulty with motor planning when stepping over cones and requires min verbal cues for proper technique and foot placement. Pt reports dizziness and demonstrates unsteadiness when performing turning in hallway.  At start of session the pt felt a "pop" in the distal medial hamstring tendon region.  TTP upon assessment but no brusing, swelling, warmth, or discoloration appreciated.  Homan's sign negative.  Pt reports  she has popped vessels in her legs in the past and they have felt similar.  They typically feel better after using ice.  Ice applied to this region at the end of the session and pt instructed on proper use of ice at home, pt verbalized understanding.  Pt will benefit from continued skilled PT interventions for improved strength, balance, and independence.      Rehab Potential  Good    PT Frequency  2x / week    PT Duration  8 weeks    PT Treatment/Interventions  ADLs/Self Care Home Management;Aquatic Therapy;Cryotherapy;Electrical Stimulation;Iontophoresis 416mml Dexamethasone;Moist Heat;Traction;Ultrasound;DME Instruction;Gait training;Stair training;Functional mobility training;Therapeutic activities;Therapeutic exercise;Balance training;Neuromuscular re-education;Patient/family education;Orthotic Fit/Training;Wheelchair mobility training;Manual techniques;Compression bandaging;Passive range of motion;Dry needling;Energy conservation;Taping    PT Next Visit Plan  Progress HEP and continue with strength and balance training, gait training    PT Home Exercise Plan  seated marching, LAQ    Consulted and Agree with Plan of Care  Patient       Patient will benefit from skilled therapeutic intervention in order to improve the following deficits and impairments:  Abnormal gait, Decreased activity tolerance, Decreased balance, Decreased endurance, Decreased knowledge of use of DME, Decreased mobility, Decreased range of motion, Decreased safety awareness, Decreased strength, Difficulty walking, Hypomobility, Increased fascial  restricitons, Impaired perceived functional ability, Impaired flexibility, Increased muscle spasms, Impaired sensation, Improper body mechanics, Postural dysfunction, Pain  Visit Diagnosis: Unsteadiness on feet  Muscle weakness (generalized)  Other abnormalities of gait and mobility     Problem List Patient Active Problem List   Diagnosis Date Noted  . Malnutrition of  moderate degree 03/31/2017  . Acute respiratory failure with hypoxia ([place]) 03/30/2017  . AKI (acute kidney injury) ([place]) 07/15/2016  . Protein-calorie malnutrition, severe 02/08/2016  . Primary cancer of right upper lobe of lung ([place]) 11/20/2015  . Abnormal CT lung screening 10/17/2015  . Personal history of tobacco use, presenting hazards to health 10/15/2015  . Chronic vulvitis 09/26/2014  . Allergic reaction 09/26/2014  . Carotid stenosis 08/30/2014  . Cervical nerve root disorder 08/10/2014  . CAD in native artery 08/10/2014  . B12 deficiency 08/10/2014  . Back ache 08/10/2014  . Bronchitis, chronic ([place]) 08/10/2014  . Diabetes mellitus with polyneuropathy ([place]) 08/10/2014  . Can't get food down 08/10/2014  . Eczema of external ear 08/10/2014  . Accumulation of fluid in tissues 08/10/2014  . Gout 08/10/2014  . Adult hypothyroidism 08/10/2014  . Mononeuritis 08/10/2014  . Muscle ache 08/10/2014  . Disorder of peripheral nervous system 08/10/2014  . Lesion of vulva 08/10/2014  . Cervical spondylosis with radiculopathy 10/19/2013  . COPD exacerbation ([place]) 03/29/2013  . CAD (coronary artery disease) 06/22/2011  . COPD (chronic obstructive pulmonary disease) with emphysema ([place]) 03/07/2010  . CHEST PAIN UNSPECIFIED 07/22/2009  . HLD (hyperlipidemia) 04/01/2009  . Malaise and fatigue 04/01/2009  . Hyperlipidemia 01/18/2009  . TOBACCO ABUSE 01/18/2009  . HYPERTENSION, BENIGN 01/18/2009  . CLAUDICATION 01/18/2009  . Pain in limb 01/18/2009  . CAFL (chronic airflow limitation) ([place]) 01/20/2007  . Late effects of cerebrovascular disease 01/10/2007  . Essential (primary) hypertension 12/22/2006    Collie Siad PT, DPT 11/25/2017, 4:11 PM  [place] MAIN [facility] SERVICES [address] [place], [place], [address] Phone: [phone]   Fax:  [phone]  Name: [name] [name] MRN: [id number] Date of Birth: [birth year]

## 2017-11-30 ENCOUNTER — Encounter: Payer: Self-pay | Admitting: Physical Therapy

## 2017-11-30 ENCOUNTER — Ambulatory Visit: Payer: Medicare Other | Admitting: Physical Therapy

## 2017-11-30 DIAGNOSIS — R2681 Unsteadiness on feet: Secondary | ICD-10-CM

## 2017-11-30 DIAGNOSIS — R2689 Other abnormalities of gait and mobility: Secondary | ICD-10-CM

## 2017-11-30 DIAGNOSIS — M6281 Muscle weakness (generalized): Secondary | ICD-10-CM

## 2017-11-30 NOTE — Therapy (Addendum)
[place] MAIN [facility] SERVICES [address] [place], [place], [address] Phone: [phone]   Fax:  [phone]  Physical Therapy Treatment  Patient Details  Name: [name] [name] MRN: [id number] Date of Birth: 06/05/1940 Referring Provider: Jerrol Banana, MD   Encounter Date: 11/30/2017  PT End of Session - 11/30/17 1520    Visit Number  13    Number of Visits  17    Date for PT Re-Evaluation  12/07/17    Authorization Type  3/10 progress note (start of reporting period 11/18/17)    PT Start Time  1514    PT Stop Time  1600    PT Time Calculation (min)  46 min    Equipment Utilized During Treatment  Gait belt    Activity Tolerance  Patient tolerated treatment well;Patient limited by fatigue    Behavior During Therapy  [facility] for tasks assessed/performed       Past Medical History:  Diagnosis Date  . Abnormal CT lung screening 10/17/2015  . COPD (chronic obstructive pulmonary disease) ([place])   . Coronary artery disease, non-occlusive    a. cath 2006: min nonobs CAD; b. cath 12/2010: cath LAD 50%, RCA 60%; c. 08/2013: Minimal luminal irregs, right dominant system with no significant CAD, diffuse luminal irregs noted. Normal EF 55%, no AS or MS.   . Diabetes mellitus   . Hyperlipemia    Followed by Dr. Rosanna Randy  . Hypertension   . Lung cancer ([place])   . Macular degeneration    rt  . Personal history of tobacco use, presenting hazards to health 10/15/2015  . Pneumonia    hx  . Shortness of breath   . Stroke [facility])     Past Surgical History:  Procedure Laterality Date  . ABDOMINAL HYSTERECTOMY    . ANTERIOR CERVICAL DECOMP/DISCECTOMY FUSION N/A 10/19/2013   Procedure: CERVICAL FIVE-SIX ANTERIOR CERVICAL DECOMPRESSION WITH FUSION INTERBODY PROSTHESIS PLATING AND PEEK CAGE;  Surgeon: Ophelia Charter, MD;  Location: [place] NEURO ORS;  Service: Neurosurgery;  Laterality: N/A;  . BACK SURGERY  80's  . BREAST CYST EXCISION Left    left negative    . CARDIAC CATHETERIZATION  05/2004  . CATARACT EXTRACTION Left   . CHOLECYSTECTOMY    . ENDARTERECTOMY Left 10/24/2014   Procedure: ENDARTERECTOMY CAROTID;  Surgeon: Algernon Huxley, MD;  Location: [facility] ORS;  Service: Vascular;  Laterality: Left;  . ENDOBRONCHIAL ULTRASOUND N/A 11/14/2015   Procedure: ENDOBRONCHIAL ULTRASOUND;  Surgeon: [name], MD;  Location: [facility] ORS;  Service: Cardiopulmonary;  Laterality: N/A;  . PERIPHERAL VASCULAR CATHETERIZATION N/A 12/04/2015   Procedure: Glori Luis Cath Insertion;  Surgeon: Algernon Huxley, MD;  Location: [place] CV LAB;  Service: Cardiovascular;  Laterality: N/A;  . PORTA CATH REMOVAL N/A 05/17/2017   Procedure: PORTA CATH REMOVAL;  Surgeon: Algernon Huxley, MD;  Location: [place] CV LAB;  Service: Cardiovascular;  Laterality: N/A;  . TONSILLECTOMY AND ADENOIDECTOMY    . VESICOVAGINAL FISTULA CLOSURE W/ TAH      There were no vitals filed for this visit.  Subjective Assessment - 11/30/17 1513    Subjective  Patient reports she is doing well; had a good weekend. Pt reports she takes Sunday as her rest days; denies any new falls or pain today.    Pertinent History  Pt presents with reports of imbalance and BLE weakness. Pt reports that she had HHPT ~1 year ago and since has become more weak.  Pt  reports 6 falls in the past 6 months.  Pt reports she loses her balance due to her RLE giving way, causing her to fall.  Pt with h/o stroke but denies it affecting one side more than the other.  Pt with h/o lung cancer with last treatment ~2 years ago.  Pt reports 8/10 LBP which she attributes to falls in the past.  Pt is able to ascend/descend steps but this is challenging.   Pt has a ramp but she still goes up the steps.  Husband does the driving, grocery shopping.  Pt helps with cooking and cleaning.  Pt independent with showering and dressing.  Pt ambulates with her SPC at all times.  Pt checks her BP, pulse, O2, blood glucose each day.  She also checks the  bottom of her feet 1x/wk due to her peripheral neuropathy (addressed this session to check 1x/day).     How long can you sit comfortably?  no issues    How long can you stand comfortably?  10 minutes    How long can you walk comfortably?  household distances    Patient Stated Goals  Pt would like to be able to walk farther, no falls, up and down steps    Currently in Pain?  No/denies       Treatment BLE leg press 90# 2x12 reps, VCs for controlling eccentric return for better LE strengthening and control   Walk to cafeteria and back with tripod cane, CGA for safety, one seated rest break in cafeteria, VCs required for increasing step length and picking up feet while walking to avoid shuffling steps  Forwards/backwards stepping over blue bolster x10 reps, CGA for safety with VCs for lifting feet up high when stepping Side stepping over bolster x10 reps each direction, CGA for safety, VCs for taking a big enough step for both feet to clear the bolster  Forwards walking in // bars without UE support x 4 laps Backwards walking in // bars with intermittent RUE support x 4 laps -CGA for safety with VCs for increasing step length and picking up feet to avoid shuffling steps  Standing on airex pad: -Side/side ball passes with red weighted ball with therapist x10 passes -Tandem stance with 15 overhead ball lifts with red weighted ball x15 lifts with each foot in front CGA for safety with VCs for utilizing ankle strategy to maintain balance           PT Education - 11/30/17 1519    Education Details  Exercise technique, LE strengthening    Person(s) Educated  Patient    Methods  Explanation;Demonstration;Verbal cues    Comprehension  Verbalized understanding;Returned demonstration;Verbal cues required;Need further instruction       PT Short Term Goals - 11/18/17 1402      PT SHORT TERM GOAL #1   Title  Pt will independently complete HEP at least 4 days/wk for improved carryover  between sessions    Baseline  reports compliance with HEP     Time  2    Period  Weeks    Status  Partially Met      PT SHORT TERM GOAL #2   Title  Pt will inspect her feet at least 1x/day for proper foot care due to peripheral neuropathy    Baseline  increasing frequency     Time  2    Period  Weeks    Status  On-going        PT Long Term Goals -  11/18/17 1306      PT LONG TERM GOAL #1   Title  Pt's ABC scale will improve to at least 50% to demonstrate decreased perception of imbalance    Baseline  20%; 8/15: 45%    Time  8    Period  Weeks    Status  New      PT LONG TERM GOAL #2   Title  Pt's TUG time (with SPC) will decrease to less than or equal to 20 seconds to demonstrate improved mobility    Baseline  32 seconds; 8/15: 31 seconds    Time  8    Period  Weeks    Status  Partially Met      PT LONG TERM GOAL #3   Title  Pt's Berg Balance Test will improve to at least 42/56 to demonstrate decreased risk of falling    Baseline  23/56; 8/15: 33/56     Time  8    Period  Weeks    Status  Partially Met      PT LONG TERM GOAL #4   Title  Pt's 71mT (with SPC) will improve to at least 0.6 m/s to demonstrate improved gait speed in the home in community    Baseline  0.26 m/s; 8/15: .42 m/s with AD     Time  6    Period  Weeks    Status  Partially Met            Plan - 11/30/17 1609    Clinical Impression Statement  Pt tolerated therapy session well. Pt performed ambulation to and from cafeteria with tripod based cane; required CGA for safety with VCs for increasing step length and picking feet up when walking to avoid shuffling steps. PT provided VCs and demonstration for beginning each step with heel strike; demonstrated improved ability to have heel strike on the left vs the right and had increased difficulty shifting weight to the L to take a longer step with the R foot. Pt performed dynamic postural activities with stepping over bolster and walking  forwards/backwards. Pt will benefit from continued skilled PT interventions for improved strength, balance, and gait safety.     Rehab Potential  Good    PT Frequency  2x / week    PT Duration  8 weeks    PT Treatment/Interventions  ADLs/Self Care Home Management;Aquatic Therapy;Cryotherapy;Electrical Stimulation;Iontophoresis 467mml Dexamethasone;Moist Heat;Traction;Ultrasound;DME Instruction;Gait training;Stair training;Functional mobility training;Therapeutic activities;Therapeutic exercise;Balance training;Neuromuscular re-education;Patient/family education;Orthotic Fit/Training;Wheelchair mobility training;Manual techniques;Compression bandaging;Passive range of motion;Dry needling;Energy conservation;Taping    PT Next Visit Plan  Progress HEP and continue with strength and balance training, gait training    PT Home Exercise Plan  seated marching, LAQ    Consulted and Agree with Plan of Care  Patient       Patient will benefit from skilled therapeutic intervention in order to improve the following deficits and impairments:  Abnormal gait, Decreased activity tolerance, Decreased balance, Decreased endurance, Decreased knowledge of use of DME, Decreased mobility, Decreased range of motion, Decreased safety awareness, Decreased strength, Difficulty walking, Hypomobility, Increased fascial restricitons, Impaired perceived functional ability, Impaired flexibility, Increased muscle spasms, Impaired sensation, Improper body mechanics, Postural dysfunction, Pain  Visit Diagnosis: Unsteadiness on feet  Muscle weakness (generalized)  Other abnormalities of gait and mobility     Problem List Patient Active Problem List   Diagnosis Date Noted  . Malnutrition of moderate degree 03/31/2017  . Acute respiratory failure with hypoxia ([place] [place]/25/2018  . AKI (acute kidney  injury) ([place]) 07/15/2016  . Protein-calorie malnutrition, severe 02/08/2016  . Primary cancer of right upper lobe of lung ([place])  11/20/2015  . Abnormal CT lung screening 10/17/2015  . Personal history of tobacco use, presenting hazards to health 10/15/2015  . Chronic vulvitis 09/26/2014  . Allergic reaction 09/26/2014  . Carotid stenosis 08/30/2014  . Cervical nerve root disorder 08/10/2014  . CAD in native artery 08/10/2014  . B12 deficiency 08/10/2014  . Back ache 08/10/2014  . Bronchitis, chronic ([place]) 08/10/2014  . Diabetes mellitus with polyneuropathy ([place]) 08/10/2014  . Can't get food down 08/10/2014  . Eczema of external ear 08/10/2014  . Accumulation of fluid in tissues 08/10/2014  . Gout 08/10/2014  . Adult hypothyroidism 08/10/2014  . Mononeuritis 08/10/2014  . Muscle ache 08/10/2014  . Disorder of peripheral nervous system 08/10/2014  . Lesion of vulva 08/10/2014  . Cervical spondylosis with radiculopathy 10/19/2013  . COPD exacerbation ([place]) 03/29/2013  . CAD (coronary artery disease) 06/22/2011  . COPD (chronic obstructive pulmonary disease) with emphysema ([place]) 03/07/2010  . CHEST PAIN UNSPECIFIED 07/22/2009  . HLD (hyperlipidemia) 04/01/2009  . Malaise and fatigue 04/01/2009  . Hyperlipidemia 01/18/2009  . TOBACCO ABUSE 01/18/2009  . HYPERTENSION, BENIGN 01/18/2009  . CLAUDICATION 01/18/2009  . Pain in limb 01/18/2009  . CAFL (chronic airflow limitation) ([place]) 01/20/2007  . Late effects of cerebrovascular disease 01/10/2007  . Essential (primary) hypertension 12/22/2006   Harriet Masson, SPT   This entire session was performed under direct supervision and direction of a licensed therapist/therapist assistant . I have personally read, edited and approve of the note as written. Collie Siad PT, DPT 11/30/2017, 4:21 PM  [place] MAIN [facility] SERVICES [address] [place], [place], [address] Phone: [phone]   Fax:  [phone]  Name: ODESTER NILSON MRN: [id number] Date of Birth: 07-25-1940

## 2017-12-02 ENCOUNTER — Encounter: Payer: Self-pay | Admitting: Physical Therapy

## 2017-12-02 ENCOUNTER — Ambulatory Visit: Payer: Medicare Other | Admitting: Physical Therapy

## 2017-12-02 DIAGNOSIS — M6281 Muscle weakness (generalized): Secondary | ICD-10-CM

## 2017-12-02 DIAGNOSIS — R2681 Unsteadiness on feet: Secondary | ICD-10-CM

## 2017-12-02 DIAGNOSIS — R2689 Other abnormalities of gait and mobility: Secondary | ICD-10-CM | POA: Diagnosis not present

## 2017-12-02 NOTE — Therapy (Signed)
[place] MAIN [facility] SERVICES [address] [place], [place], [address] Phone: [phone]   Fax:  [phone]  Physical Therapy Treatment  Patient Details  Name: [name] [name] MRN: [id number] Date of Birth: [birth year] Referring Provider: Jerrol Banana, MD   Encounter Date: 12/02/2017  PT End of Session - 12/02/17 1313    Visit Number  14    Number of Visits  17    Date for PT Re-Evaluation  12/07/17    Authorization Type  4/10 progress note (start of reporting period 11/18/17)    Equipment Utilized During Treatment  Gait belt    Activity Tolerance  Patient tolerated treatment well;Patient limited by fatigue    Behavior During Therapy  [facility] for tasks assessed/performed       Past Medical History:  Diagnosis Date  . Abnormal CT lung screening 10/17/2015  . COPD (chronic obstructive pulmonary disease) ([place])   . Coronary artery disease, non-occlusive    a. cath 2006: min nonobs CAD; b. cath 12/2010: cath LAD 50%, RCA 60%; c. 08/2013: Minimal luminal irregs, right dominant system with no significant CAD, diffuse luminal irregs noted. Normal EF 55%, no AS or MS.   . Diabetes mellitus   . Hyperlipemia    Followed by Dr. Rosanna Randy  . Hypertension   . Lung cancer ([place])   . Macular degeneration    rt  . Personal history of tobacco use, presenting hazards to health 10/15/2015  . Pneumonia    hx  . Shortness of breath   . Stroke [facility])     Past Surgical History:  Procedure Laterality Date  . ABDOMINAL HYSTERECTOMY    . ANTERIOR CERVICAL DECOMP/DISCECTOMY FUSION N/A 10/19/2013   Procedure: CERVICAL FIVE-SIX ANTERIOR CERVICAL DECOMPRESSION WITH FUSION INTERBODY PROSTHESIS PLATING AND PEEK CAGE;  Surgeon: Ophelia Charter, MD;  Location: [place] NEURO ORS;  Service: Neurosurgery;  Laterality: N/A;  . BACK SURGERY  80's  . BREAST CYST EXCISION Left    left negative   . CARDIAC CATHETERIZATION  05/2004  . CATARACT EXTRACTION Left   .  CHOLECYSTECTOMY    . ENDARTERECTOMY Left 10/24/2014   Procedure: ENDARTERECTOMY CAROTID;  Surgeon: Algernon Huxley, MD;  Location: [facility] ORS;  Service: Vascular;  Laterality: Left;  . ENDOBRONCHIAL ULTRASOUND N/A 11/14/2015   Procedure: ENDOBRONCHIAL ULTRASOUND;  Surgeon: [name], MD;  Location: [facility] ORS;  Service: Cardiopulmonary;  Laterality: N/A;  . PERIPHERAL VASCULAR CATHETERIZATION N/A 12/04/2015   Procedure: Glori Luis Cath Insertion;  Surgeon: Algernon Huxley, MD;  Location: [place] CV LAB;  Service: Cardiovascular;  Laterality: N/A;  . PORTA CATH REMOVAL N/A 05/17/2017   Procedure: PORTA CATH REMOVAL;  Surgeon: Algernon Huxley, MD;  Location: [place] CV LAB;  Service: Cardiovascular;  Laterality: N/A;  . TONSILLECTOMY AND ADENOIDECTOMY    . VESICOVAGINAL FISTULA CLOSURE W/ TAH      There were no vitals filed for this visit.  Subjective Assessment - 12/02/17 1312    Subjective  Patient reports she is doing well; had a good weekend. Pt reports she takes Sunday as her rest days; denies any new falls or pain today.    Pertinent History  Pt presents with reports of imbalance and BLE weakness. Pt reports that she had HHPT ~1 year ago and since has become more weak.  Pt reports 6 falls in the past 6 months.  Pt reports she loses her balance due to her RLE giving way, causing her to fall.  Pt with h/o stroke but denies it affecting one side more than the other.  Pt with h/o lung cancer with last treatment ~2 years ago.  Pt reports 8/10 LBP which she attributes to falls in the past.  Pt is able to ascend/descend steps but this is challenging.   Pt has a ramp but she still goes up the steps.  Husband does the driving, grocery shopping.  Pt helps with cooking and cleaning.  Pt independent with showering and dressing.  Pt ambulates with her SPC at all times.  Pt checks her BP, pulse, O2, blood glucose each day.  She also checks the bottom of her feet 1x/wk due to her peripheral neuropathy (addressed this  session to check 1x/day).     How long can you sit comfortably?  no issues    How long can you stand comfortably?  10 minutes    How long can you walk comfortably?  household distances    Patient Stated Goals  Pt would like to be able to walk farther, no falls, up and down steps    Currently in Pain?  No/denies    Pain Score  0-No pain    Pain Onset  Yesterday    Pain Onset  Yesterday          Treatment Warm up on Nustep level 2 x3 min BLE leg press 90# 3x12 reps, VCs for slowing down return and positioning of feet 4 inch step ups x1 min forwards/back down, CGA for safety with VCs for increasing speed to work on cardiovascular endurance 4 inch side step up and over x10 reps each direction, CGA for safety with VCs for sequencing and taking big enough steps to get each foot across the step Forward/backward step over orange 6 inch hurdle x10 reps, CGA for safety with min assist, , VCs for lifting the foot up high enough to clear the hurdle Side step over orange 6 inch hurdle x10 reps each direction, CGA for safety with VCs to decrease UE support and lifting each foot up high enough  march ing on purple foam x 20 without UE support  Standing 2# ankle weights: Hip flex with knee straight x 10 reps , BLE Hip extension with UE support x10 reps each leg Heel raises x10 reps bilaterally without UE support Hip abduction x10 reps each leg with single UE support VCs required for proper technique and positioning  Seated 2# ankle weights: LAQ x 10 x 2  Hip flex x 20 x 1 set  Hamstring curls with green tband x10 reps each leg VCs required for proper technique and positioning                      PT Education - 12/02/17 1313    Education Details  saftey, gait, mobility , HEP    Person(s) Educated  Patient    Methods  Explanation;Verbal cues;Tactile cues    Comprehension  Verbalized understanding       PT Short Term Goals - 11/18/17 1402      PT SHORT TERM GOAL #1    Title  Pt will independently complete HEP at least 4 days/wk for improved carryover between sessions    Baseline  reports compliance with HEP     Time  2    Period  Weeks    Status  Partially Met      PT SHORT TERM GOAL #2   Title  Pt will inspect her feet at least  1x/day for proper foot care due to peripheral neuropathy    Baseline  increasing frequency     Time  2    Period  Weeks    Status  On-going        PT Long Term Goals - 11/18/17 1306      PT LONG TERM GOAL #1   Title  Pt's ABC scale will improve to at least 50% to demonstrate decreased perception of imbalance    Baseline  20%; 8/15: 45%    Time  8    Period  Weeks    Status  New      PT LONG TERM GOAL #2   Title  Pt's TUG time (with SPC) will decrease to less than or equal to 20 seconds to demonstrate improved mobility    Baseline  32 seconds; 8/15: 31 seconds    Time  8    Period  Weeks    Status  Partially Met      PT LONG TERM GOAL #3   Title  Pt's Berg Balance Test will improve to at least 42/56 to demonstrate decreased risk of falling    Baseline  23/56; 8/15: 33/56     Time  8    Period  Weeks    Status  Partially Met      PT LONG TERM GOAL #4   Title  Pt's 16mT (with SPC) will improve to at least 0.6 m/s to demonstrate improved gait speed in the home in community    Baseline  0.26 m/s; 8/15: .42 m/s with AD     Time  6    Period  Weeks    Status  Partially Met            Plan - 12/02/17 1316    Clinical Impression Statement  Patient tolerated session well;  patient was instructed in LE strengthening exercise for HEP. Patient required min VCs for correct exercise technique including to increase ROM and slow down LE movement for better strengthening; Patient also instructed in balance activities. Patient does require rail assist especially when standing on compliant surfaces. Patient would benefit from additional skilled PT intervention to improve strength, balance and gait safety;    Rehab  Potential  Good    PT Frequency  2x / week    PT Duration  8 weeks    PT Treatment/Interventions  ADLs/Self Care Home Management;Aquatic Therapy;Cryotherapy;Electrical Stimulation;Iontophoresis 422mml Dexamethasone;Moist Heat;Traction;Ultrasound;DME Instruction;Gait training;Stair training;Functional mobility training;Therapeutic activities;Therapeutic exercise;Balance training;Neuromuscular re-education;Patient/family education;Orthotic Fit/Training;Wheelchair mobility training;Manual techniques;Compression bandaging;Passive range of motion;Dry needling;Energy conservation;Taping    PT Next Visit Plan  Progress HEP and continue with strength and balance training, gait training    PT Home Exercise Plan  seated marching, LAQ    Consulted and Agree with Plan of Care  Patient       Patient will benefit from skilled therapeutic intervention in order to improve the following deficits and impairments:  Abnormal gait, Decreased activity tolerance, Decreased balance, Decreased endurance, Decreased knowledge of use of DME, Decreased mobility, Decreased range of motion, Decreased safety awareness, Decreased strength, Difficulty walking, Hypomobility, Increased fascial restricitons, Impaired perceived functional ability, Impaired flexibility, Increased muscle spasms, Impaired sensation, Improper body mechanics, Postural dysfunction, Pain  Visit Diagnosis: Unsteadiness on feet  Muscle weakness (generalized)  Other abnormalities of gait and mobility     Problem List Patient Active Problem List   Diagnosis Date Noted  . Malnutrition of moderate degree 03/31/2017  . Acute respiratory failure with hypoxia ([facility]  03/30/2017  . AKI (acute kidney injury) ([place]) 07/15/2016  . Protein-calorie malnutrition, severe 02/08/2016  . Primary cancer of right upper lobe of lung ([place]) 11/20/2015  . Abnormal CT lung screening 10/17/2015  . Personal history of tobacco use, presenting hazards to health 10/15/2015  .  Chronic vulvitis 09/26/2014  . Allergic reaction 09/26/2014  . Carotid stenosis 08/30/2014  . Cervical nerve root disorder 08/10/2014  . CAD in native artery 08/10/2014  . B12 deficiency 08/10/2014  . Back ache 08/10/2014  . Bronchitis, chronic ([place]) 08/10/2014  . Diabetes mellitus with polyneuropathy ([place]) 08/10/2014  . Can't get food down 08/10/2014  . Eczema of external ear 08/10/2014  . Accumulation of fluid in tissues 08/10/2014  . Gout 08/10/2014  . Adult hypothyroidism 08/10/2014  . Mononeuritis 08/10/2014  . Muscle ache 08/10/2014  . Disorder of peripheral nervous system 08/10/2014  . Lesion of vulva 08/10/2014  . Cervical spondylosis with radiculopathy 10/19/2013  . COPD exacerbation ([place]) 03/29/2013  . CAD (coronary artery disease) 06/22/2011  . COPD (chronic obstructive pulmonary disease) with emphysema ([place]) 03/07/2010  . CHEST PAIN UNSPECIFIED 07/22/2009  . HLD (hyperlipidemia) 04/01/2009  . Malaise and fatigue 04/01/2009  . Hyperlipidemia 01/18/2009  . TOBACCO ABUSE 01/18/2009  . HYPERTENSION, BENIGN 01/18/2009  . CLAUDICATION 01/18/2009  . Pain in limb 01/18/2009  . CAFL (chronic airflow limitation) ([place]) 01/20/2007  . Late effects of cerebrovascular disease 01/10/2007  . Essential (primary) hypertension 12/22/2006    Alanson Puls, [place] DPT 12/02/2017, 1:19 PM  [place] MAIN [facility] SERVICES [address] [place], [place], [address] Phone: [phone]   Fax:  [phone]  Name: [name] [name] MRN: [id number] Date of Birth: April 24, 1940

## 2017-12-03 IMAGING — CR DG CHEST 2V
2 series · 2 of 2 positions shown · non-contrast
Comparison: PET-CT 10/23/2015. Chest CT 10/15/2015. Chest
radiographs 01/27/2015.

CLINICAL DATA: Chronic cough and shortness of breath. Stage III
small cell right upper lobe lung cancer.

EXAM:
CHEST  2 VIEW

[chest pa]
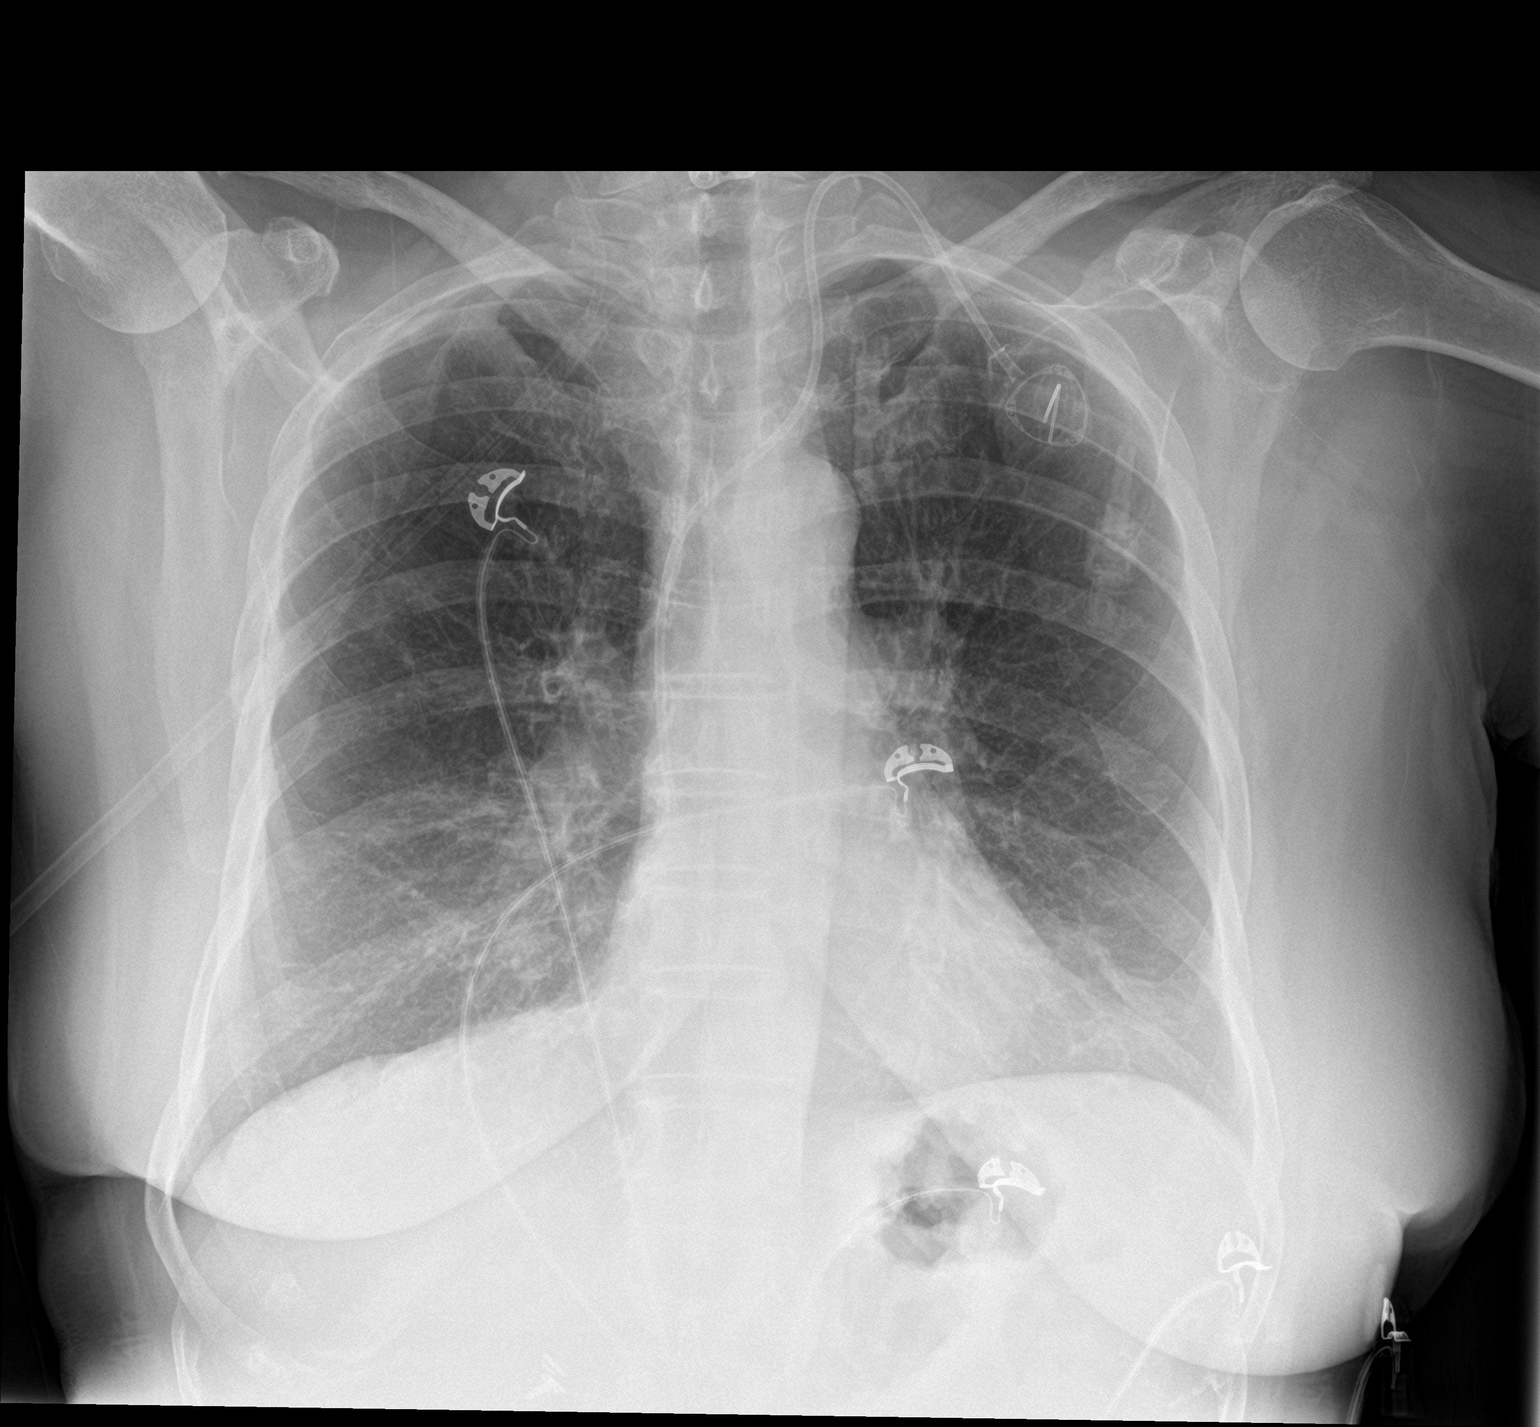

[chest lat]
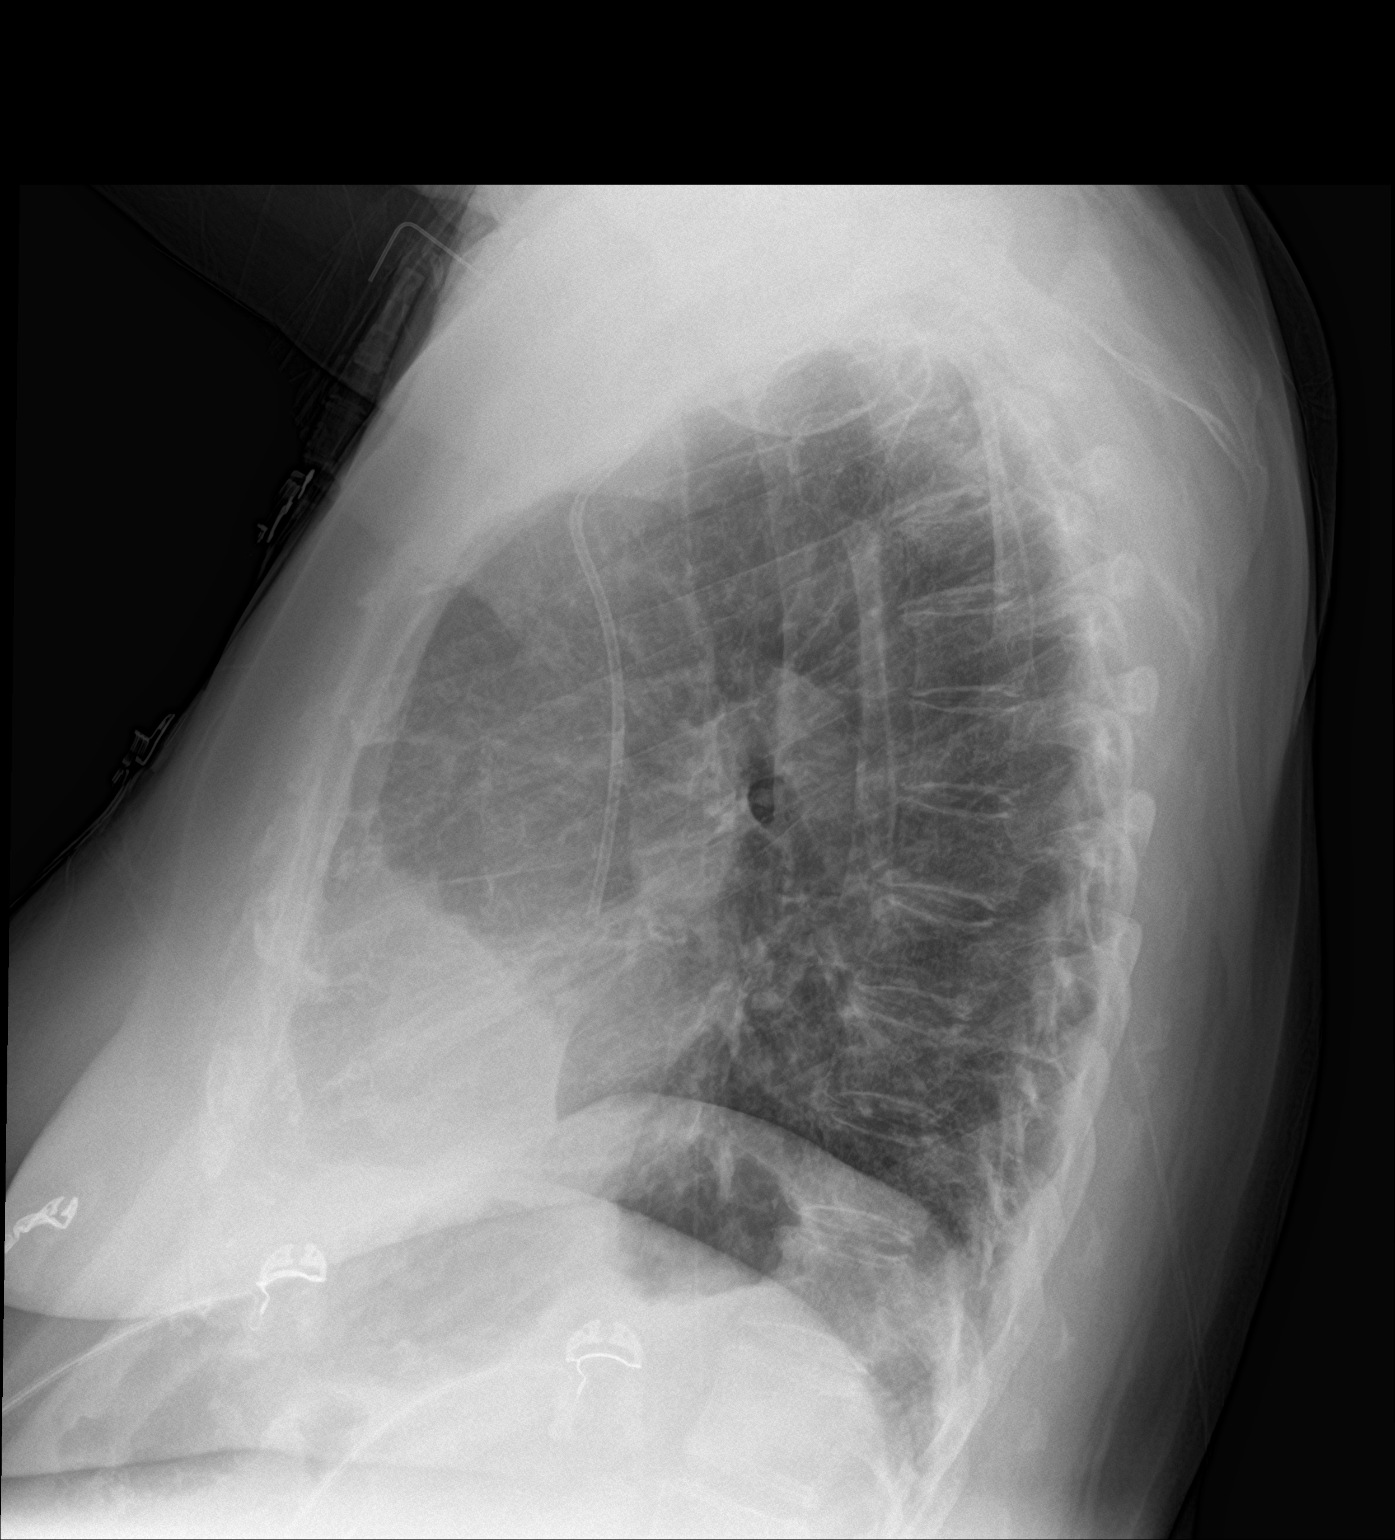

[2 of 2 positions shown; findings below may reference images not displayed]

FINDINGS: Left jugular Port-A-Cath terminates over the lower SVC. The
cardiomediastinal silhouette is within normal limits. Right upper
lobe nodule on PET-CT is not well seen on these radiographs. No
airspace consolidation, edema, pleural effusion, or pneumothorax is
identified. Right upper quadrant abdominal surgical clips and prior
ACDF are noted. No acute osseous abnormality is seen.
IMPRESSION: No evidence of acute cardiopulmonary process.

## 2017-12-07 ENCOUNTER — Ambulatory Visit: Payer: Medicare Other | Admitting: Physical Therapy

## 2017-12-08 ENCOUNTER — Encounter: Payer: Self-pay | Admitting: Family Medicine

## 2017-12-08 ENCOUNTER — Ambulatory Visit (INDEPENDENT_AMBULATORY_CARE_PROVIDER_SITE_OTHER): Payer: Medicare Other | Admitting: Family Medicine

## 2017-12-08 VITALS — BP 144/72 | HR 72 | Temp 98.1°F | Wt 152.0 lb

## 2017-12-08 DIAGNOSIS — H6692 Otitis media, unspecified, left ear: Secondary | ICD-10-CM | POA: Diagnosis not present

## 2017-12-08 DIAGNOSIS — J441 Chronic obstructive pulmonary disease with (acute) exacerbation: Secondary | ICD-10-CM | POA: Diagnosis not present

## 2017-12-08 MED ORDER — DOXYCYCLINE HYCLATE 100 MG PO TABS: 100.0000 mg | ORAL_TABLET | Freq: Two times a day (BID) | ORAL | 0 refills | Status: DC

## 2017-12-08 NOTE — Progress Notes (Signed)
Patient: [name] [name] Female    DOB: January 06, 1941   77 y.o.   MRN: [id number] Visit Date: 12/08/2017  Today's Provider: [name], MD   Chief Complaint  Patient presents with  . Cough   Subjective:    Cough  This is a new problem. The current episode started in the past 7 days. The problem has been gradually worsening. The cough is non-productive. Associated symptoms include headaches and postnasal drip. Pertinent negatives include no fever, nasal congestion, sore throat, shortness of breath or wheezing. Nothing aggravates the symptoms. She has tried OTC cough suppressant for the symptoms. The treatment provided mild relief. Her past medical history is significant for COPD and environmental allergies.       Allergies  Allergen Reactions  . Coconut Fatty Acids Swelling and Other (See Comments)    Throat swells     Current Outpatient Medications:  .  albuterol (PROVENTIL) (5 MG/ML) 0.5% nebulizer solution, Take 0.5 mLs (2.5 mg total) by nebulization every 6 (six) hours as needed for wheezing or shortness of breath. (Patient taking differently: Take 2.5 mg by nebulization daily. ), Disp: 100 vial, Rfl: 0 .  Blood Glucose Monitoring Suppl (ONE TOUCH ULTRA 2) w/Device KIT, 1 each by Does not apply route See admin instructions., Disp: 1 each, Rfl: 0 .  budesonide-formoterol (SYMBICORT) 160-4.5 MCG/ACT inhaler, Inhale 2 puffs into the lungs 2 (two) times daily., Disp: 3 Inhaler, Rfl: 3 .  fluticasone (FLONASE) 50 MCG/ACT nasal spray, Place 2 sprays into both nostrils daily., Disp: 16 g, Rfl: 6 .  gabapentin (NEURONTIN) 600 MG tablet, TAKE ONE-HALF TABLET BY  MOUTH TWO TIMES DAILY, Disp: 90 tablet, Rfl: 3 .  glimepiride (AMARYL) 2 MG tablet, Take 1 tablet (2 mg total) by mouth 2 (two) times daily., Disp: 180 tablet, Rfl: 3 .  HYDROcodone-acetaminophen (NORCO) 5-325 MG tablet, Take 1 tablet by mouth every 6 (six) hours as needed for moderate pain or severe pain., Disp: 20  tablet, Rfl: 0 .  isosorbide mononitrate (IMDUR) 30 MG 24 hr tablet, Take 1 tablet (30 mg total) by mouth daily., Disp: 90 tablet, Rfl: 3 .  magnesium oxide (MAG-OX) 400 (241.3 MG) MG tablet, Take 400 mg by mouth 2 (two) times daily. , Disp: , Rfl:  .  metoprolol tartrate (LOPRESSOR) 25 MG tablet, Take 1 tablet (25 mg total) by mouth 2 (two) times daily., Disp: 180 tablet, Rfl: 3 .  mometasone (ELOCON) 0.1 % cream, Apply small amount nightly to each ear, Disp: 45 g, Rfl: 0 .  Multiple Vitamins-Minerals (ICAPS) CAPS, Take 1 capsule by mouth 2 (two) times daily. , Disp: , Rfl:  .  nitroGLYCERIN (NITROSTAT) 0.4 MG SL tablet, Place 1 tablet (0.4 mg total) under the tongue every 5 (five) minutes as needed for chest pain., Disp: 25 tablet, Rfl: 3 .  ondansetron (ZOFRAN) 8 MG tablet, Take 1 tablet (8 mg total) by mouth every 8 (eight) hours as needed for nausea or vomiting., Disp: 30 tablet, Rfl: 1 .  ONE TOUCH ULTRA TEST test strip, USE TO CHECK BLOOD SUGAR ONCE DAILY, Disp: 100 each, Rfl: 11 .  pantoprazole (PROTONIX) 40 MG tablet, Take 1 tablet (40 mg total) by mouth daily., Disp: 90 tablet, Rfl: 3 .  triamcinolone cream (KENALOG) 0.1 %, Apply 1 application topically 2 (two) times daily., Disp: 30 g, Rfl: 0 .  LORazepam (ATIVAN) 1 MG tablet, Take 1 tablet (1 mg total) by mouth 2 (two) times daily  as needed for anxiety. (Patient not taking: Reported on 10/21/2017), Disp: 30 tablet, Rfl: 5 .  ofloxacin (FLOXIN) 0.3 % OTIC solution, PLACE 10 DROPS INTO THE LEFT EAR DAILY. (Patient not taking: Reported on 09/21/2017), Disp: 5 mL, Rfl: 0 No current facility-administered medications for this visit.   Facility-Administered Medications Ordered in Other Visits:  .  ondansetron (ZOFRAN) 8 mg, dexamethasone (DECADRON) 10 mg in sodium chloride 0.9 % 50 mL IVPB, , Intravenous, Once, Finnegan, Kathlene November, MD  Review of Systems  Constitutional: Negative for fever.  HENT: Positive for postnasal drip. Negative for sore  throat.   Eyes: Negative.   Respiratory: Positive for cough. Negative for shortness of breath and wheezing.   Cardiovascular: Negative.   Allergic/Immunologic: Positive for environmental allergies.  Neurological: Positive for headaches.  Psychiatric/Behavioral: Negative.     Social History   Tobacco Use  . Smoking status: Former Smoker    Packs/day: 1.00    Years: 50.00    Pack years: 50.00    Types: Cigarettes    Last attempt to quit: 10/03/2015    Years since quitting: 2.1  . Smokeless tobacco: Never Used  . Tobacco comment: smokes 3 cigs daily 05/06/15. Pt instructed to quit.  Substance Use Topics  . Alcohol use: No    Alcohol/week: 0.0 standard drinks   Objective:   BP (!) 144/72 (BP Location: Right Arm, Patient Position: Sitting, Cuff Size: Normal)   Pulse 72   Temp 98.1 F (36.7 C)   Wt 152 lb (68.9 kg)   SpO2 98%   BMI 26.09 kg/m  Vitals:   12/08/17 1546  BP: (!) 144/72  Pulse: 72  Temp: 98.1 F (36.7 C)  SpO2: 98%  Weight: 152 lb (68.9 kg)     Physical Exam  Constitutional: She is oriented to person, place, and time. She appears well-developed and well-nourished.  HENT:  Head: Normocephalic and atraumatic.  Right Ear: External ear normal.  Left Ear: External ear normal.  Nose: Nose normal.  Mouth/Throat: Oropharynx is clear and moist.  Left TM dull,erythematous   Eyes: Conjunctivae are normal. No scleral icterus.  Cardiovascular: Normal rate, regular rhythm and normal heart sounds.  Pulmonary/Chest: Effort normal and breath sounds normal.  Abdominal: Soft.  Musculoskeletal: She exhibits no edema.  Lymphadenopathy:    She has no cervical adenopathy.  Neurological: She is alert and oriented to person, place, and time.  Skin: Skin is warm and dry.  Psychiatric: She has a normal mood and affect. Her behavior is normal. Judgment and thought content normal.        Assessment & Plan:     1. Left otitis media, unspecified otitis media type  -  doxycycline (VIBRA-TABS) 100 MG tablet; Take 1 tablet (100 mg total) by mouth 2 (two) times daily.  Dispense: 20 tablet; Refill: 0  2. COPD exacerbation (HCC)    - doxycycline (VIBRA-TABS) 100 MG tablet; Take 1 tablet (100 mg total) by mouth 2 (two) times daily.  Dispense: 20 tablet; Refill: 0       Richard Cranford Mon, MD  [facility]

## 2017-12-13 ENCOUNTER — Ambulatory Visit: Payer: Self-pay

## 2017-12-14 ENCOUNTER — Ambulatory Visit: Payer: Medicare Other | Attending: Family Medicine | Admitting: Physical Therapy

## 2017-12-14 ENCOUNTER — Encounter: Payer: Self-pay | Admitting: Physical Therapy

## 2017-12-14 DIAGNOSIS — R2689 Other abnormalities of gait and mobility: Secondary | ICD-10-CM | POA: Diagnosis not present

## 2017-12-14 DIAGNOSIS — R2681 Unsteadiness on feet: Secondary | ICD-10-CM | POA: Insufficient documentation

## 2017-12-14 DIAGNOSIS — M6281 Muscle weakness (generalized): Secondary | ICD-10-CM | POA: Diagnosis not present

## 2017-12-14 NOTE — Therapy (Signed)
[place] MAIN [facility] SERVICES [address] [place], [place], [address] Phone: [phone]   Fax:  [phone]  Physical Therapy Treatment  Patient Details  Name: [name] [name] MRN: [id number] Date of Birth: 01/04/1941 Referring Provider: Jerrol Banana, MD   Encounter Date: 12/14/2017  PT End of Session - 12/14/17 1534    Visit Number  15    Number of Visits  25    Date for PT Re-Evaluation  01/11/18    Authorization Type  5/10 progress note (start of reporting period 11/18/17)    PT Start Time  1516    PT Stop Time  1600    PT Time Calculation (min)  44 min    Equipment Utilized During Treatment  Gait belt    Activity Tolerance  Patient tolerated treatment well;Patient limited by fatigue    Behavior During Therapy  [facility] for tasks assessed/performed       Past Medical History:  Diagnosis Date  . Abnormal CT lung screening 10/17/2015  . COPD (chronic obstructive pulmonary disease) ([place])   . Coronary artery disease, non-occlusive    a. cath 2006: min nonobs CAD; b. cath 12/2010: cath LAD 50%, RCA 60%; c. 08/2013: Minimal luminal irregs, right dominant system with no significant CAD, diffuse luminal irregs noted. Normal EF 55%, no AS or MS.   . Diabetes mellitus   . Hyperlipemia    Followed by Dr. Rosanna Randy  . Hypertension   . Lung cancer ([place])   . Macular degeneration    rt  . Personal history of tobacco use, presenting hazards to health 10/15/2015  . Pneumonia    hx  . Shortness of breath   . Stroke [facility])     Past Surgical History:  Procedure Laterality Date  . ABDOMINAL HYSTERECTOMY    . ANTERIOR CERVICAL DECOMP/DISCECTOMY FUSION N/A 10/19/2013   Procedure: CERVICAL FIVE-SIX ANTERIOR CERVICAL DECOMPRESSION WITH FUSION INTERBODY PROSTHESIS PLATING AND PEEK CAGE;  Surgeon: Ophelia Charter, MD;  Location: [place] NEURO ORS;  Service: Neurosurgery;  Laterality: N/A;  . BACK SURGERY  80's  . BREAST CYST EXCISION Left    left negative    . CARDIAC CATHETERIZATION  05/2004  . CATARACT EXTRACTION Left   . CHOLECYSTECTOMY    . ENDARTERECTOMY Left 10/24/2014   Procedure: ENDARTERECTOMY CAROTID;  Surgeon: Algernon Huxley, MD;  Location: [facility] ORS;  Service: Vascular;  Laterality: Left;  . ENDOBRONCHIAL ULTRASOUND N/A 11/14/2015   Procedure: ENDOBRONCHIAL ULTRASOUND;  Surgeon: [name], MD;  Location: [facility] ORS;  Service: Cardiopulmonary;  Laterality: N/A;  . PERIPHERAL VASCULAR CATHETERIZATION N/A 12/04/2015   Procedure: Glori Luis Cath Insertion;  Surgeon: Algernon Huxley, MD;  Location: [place] CV LAB;  Service: Cardiovascular;  Laterality: N/A;  . PORTA CATH REMOVAL N/A 05/17/2017   Procedure: PORTA CATH REMOVAL;  Surgeon: Algernon Huxley, MD;  Location: [place] CV LAB;  Service: Cardiovascular;  Laterality: N/A;  . TONSILLECTOMY AND ADENOIDECTOMY    . VESICOVAGINAL FISTULA CLOSURE W/ TAH      There were no vitals filed for this visit.  Subjective Assessment - 12/14/17 1524    Subjective  Patient reports increased soreness in RLE since walking around [facility]; Patient reports going to hang flyers around her mom's grave and did a lot of walking which led to increased LE discomfort (R>L)    Pertinent History  Pt presents with reports of imbalance and BLE weakness. Pt reports that she had HHPT ~1 year ago  and since has become more weak.  Pt reports 6 falls in the past 6 months.  Pt reports she loses her balance due to her RLE giving way, causing her to fall.  Pt with h/o stroke but denies it affecting one side more than the other.  Pt with h/o lung cancer with last treatment ~2 years ago.  Pt reports 8/10 LBP which she attributes to falls in the past.  Pt is able to ascend/descend steps but this is challenging.   Pt has a ramp but she still goes up the steps.  Husband does the driving, grocery shopping.  Pt helps with cooking and cleaning.  Pt independent with showering and dressing.  Pt ambulates with her SPC at all times.  Pt  checks her BP, pulse, O2, blood glucose each day.  She also checks the bottom of her feet 1x/wk due to her peripheral neuropathy (addressed this session to check 1x/day).     How long can you sit comfortably?  no issues    How long can you stand comfortably?  10 minutes    How long can you walk comfortably?  household distances    Patient Stated Goals  Pt would like to be able to walk farther, no falls, up and down steps    Currently in Pain?  Yes    Pain Score  7     Pain Location  Leg    Pain Orientation  Right    Pain Descriptors / Indicators  Sore;Tightness;Cramping    Pain Type  Acute pain    Pain Onset  In the past 7 days    Pain Frequency  Constant    Aggravating Factors   standing/movement    Pain Relieving Factors  rest/heat    Effect of Pain on Daily Activities  decreased gait tolerance     Multiple Pain Sites  No    Pain Onset  Yesterday         TREATMENT: Warm up on Nustep BUE/BLE level 2 x3 min (Unbilled);  Instructed patient in LE stretches to reduce calf tightness: Standing on step: -heel off step calf stretch 20 sec hold 2x2 reps bilaterally; -hamstring standing stretch with ankle DF 20 sec hold 2x2 reps bilaterally;  Seated: Soft tissue massage with rolling stick 2x2 min each LE calf to facilitate better tissue extensibility and reduce tightness;  Seated with 2# ankle weight: -LAQ with ankle DF x15 bilaterally; -hip flexion x15 bilaterally; -Hip adduction ball squeezes x15 bilaterally; -Heel/toe raises x15 bilaterally;  Gait around gym x80 feet x2 sets with tripod base cane; patient exhibits significantly slower gait speed with decreased step length, narrow base of support, increased shuffle with decreased DF at heel strike; required mod VCs to increase erect posture and increase step length while increasing gait speed for better momentum when ambulating and reduced fall risk; Required CGA for safety as patient very unsteady;                       PT Education - 12/14/17 1533    Education Details  stretches, soft tissue massage, exercise    Person(s) Educated  Patient    Methods  Explanation;Demonstration;Verbal cues    Comprehension  Verbalized understanding;Returned demonstration;Verbal cues required;Need further instruction       PT Short Term Goals - 12/14/17 1540      PT SHORT TERM GOAL #1   Title  Pt will independently complete HEP at least 4 days/wk for improved carryover  between sessions    Baseline  reports compliance with HEP     Time  2    Period  Weeks    Status  Achieved      PT SHORT TERM GOAL #2   Title  Pt will inspect her feet at least 1x/day for proper foot care due to peripheral neuropathy    Baseline  increasing frequency     Time  2    Period  Weeks    Status  Achieved        PT Long Term Goals - 12/14/17 1540      PT LONG TERM GOAL #1   Title  Pt's ABC scale will improve to at least 50% to demonstrate decreased perception of imbalance    Baseline  20%; 8/15: 45%    Time  4    Period  Weeks    Status  Partially Met    Target Date  01/11/18      PT LONG TERM GOAL #2   Title  Pt's TUG time (with SPC) will decrease to less than or equal to 20 seconds to demonstrate improved mobility    Baseline  32 seconds; 8/15: 31 seconds    Time  4    Period  Weeks    Status  Partially Met    Target Date  01/11/18      PT LONG TERM GOAL #3   Title  Pt's Berg Balance Test will improve to at least 42/56 to demonstrate decreased risk of falling    Baseline  23/56; 8/15: 33/56     Time  4    Period  Weeks    Status  Partially Met    Target Date  01/11/18      PT LONG TERM GOAL #4   Title  Pt's 36mT (with SPC) will improve to at least 0.6 m/s to demonstrate improved gait speed in the home in community    Baseline  0.26 m/s; 8/15: .42 m/s with AD     Time  4    Period  Weeks    Status  Partially Met    Target Date  01/11/18            Plan - 12/14/17  1541    Clinical Impression Statement  Patient had a harder time walking today due to increased soreness in RLE . She reports walking a lot over the weekend and over doing it which contributed to LE discomfort. Patient instructed in LE stretches to improve tissue flexibility and reduce soreness. She tolerated well. Patient does require min VCS for correct exercise technique; She is making improvements from start of care and would benefit from additional skilled PT Intervention to meet goals.     Rehab Potential  Good    PT Frequency  2x / week    PT Duration  4 weeks    PT Treatment/Interventions  ADLs/Self Care Home Management;Aquatic Therapy;Cryotherapy;Electrical Stimulation;Iontophoresis 486mml Dexamethasone;Moist Heat;Traction;Ultrasound;DME Instruction;Gait training;Stair training;Functional mobility training;Therapeutic activities;Therapeutic exercise;Balance training;Neuromuscular re-education;Patient/family education;Orthotic Fit/Training;Wheelchair mobility training;Manual techniques;Compression bandaging;Passive range of motion;Dry needling;Energy conservation;Taping    PT Next Visit Plan  Progress HEP and continue with strength and balance training, gait training    PT Home Exercise Plan  seated marching, LAQ    Consulted and Agree with Plan of Care  Patient       Patient will benefit from skilled therapeutic intervention in order to improve the following deficits and impairments:  Abnormal gait, Decreased activity tolerance, Decreased balance,  Decreased endurance, Decreased knowledge of use of DME, Decreased mobility, Decreased range of motion, Decreased safety awareness, Decreased strength, Difficulty walking, Hypomobility, Increased fascial restricitons, Impaired perceived functional ability, Impaired flexibility, Increased muscle spasms, Impaired sensation, Improper body mechanics, Postural dysfunction, Pain  Visit Diagnosis: Unsteadiness on feet - Plan: PT plan of care  cert/re-cert  Muscle weakness (generalized) - Plan: PT plan of care cert/re-cert  Other abnormalities of gait and mobility - Plan: PT plan of care cert/re-cert     Problem List Patient Active Problem List   Diagnosis Date Noted  . Malnutrition of moderate degree 03/31/2017  . Acute respiratory failure with hypoxia ([place]) 03/30/2017  . AKI (acute kidney injury) ([place]) 07/15/2016  . Protein-calorie malnutrition, severe 02/08/2016  . Primary cancer of right upper lobe of lung ([place]) 11/20/2015  . Abnormal CT lung screening 10/17/2015  . Personal history of tobacco use, presenting hazards to health 10/15/2015  . Chronic vulvitis 09/26/2014  . Allergic reaction 09/26/2014  . Carotid stenosis 08/30/2014  . Cervical nerve root disorder 08/10/2014  . CAD in native artery 08/10/2014  . B12 deficiency 08/10/2014  . Back ache 08/10/2014  . Bronchitis, chronic ([place]) 08/10/2014  . Diabetes mellitus with polyneuropathy ([place]) 08/10/2014  . Can't get food down 08/10/2014  . Eczema of external ear 08/10/2014  . Accumulation of fluid in tissues 08/10/2014  . Gout 08/10/2014  . Adult hypothyroidism 08/10/2014  . Mononeuritis 08/10/2014  . Muscle ache 08/10/2014  . Disorder of peripheral nervous system 08/10/2014  . Lesion of vulva 08/10/2014  . Cervical spondylosis with radiculopathy 10/19/2013  . COPD exacerbation ([place]) 03/29/2013  . CAD (coronary artery disease) 06/22/2011  . COPD (chronic obstructive pulmonary disease) with emphysema ([place]) 03/07/2010  . CHEST PAIN UNSPECIFIED 07/22/2009  . HLD (hyperlipidemia) 04/01/2009  . Malaise and fatigue 04/01/2009  . Hyperlipidemia 01/18/2009  . TOBACCO ABUSE 01/18/2009  . HYPERTENSION, BENIGN 01/18/2009  . CLAUDICATION 01/18/2009  . Pain in limb 01/18/2009  . CAFL (chronic airflow limitation) ([place]) 01/20/2007  . Late effects of cerebrovascular disease 01/10/2007  . Essential (primary) hypertension 12/22/2006    Jazelyn  PT,  DPT 12/14/2017, 4:19 PM  [place] MAIN [facility] SERVICES [address] [place], [place], [address] Phone: [phone]   Fax:  [phone]  Name: EZRAH DEMBECK MRN: [id number] Date of Birth: 13-Dec-1940

## 2017-12-16 ENCOUNTER — Ambulatory Visit
Admission: RE | Admit: 2017-12-16 | Discharge: 2017-12-16 | Disposition: A | Discharge status: Home / Self Care | Payer: Medicare Other | Source: Ambulatory Visit | Attending: Oncology | Admitting: Oncology

## 2017-12-16 ENCOUNTER — Ambulatory Visit: Payer: Medicare Other | Admitting: Physical Therapy

## 2017-12-16 DIAGNOSIS — R2681 Unsteadiness on feet: Secondary | ICD-10-CM

## 2017-12-16 DIAGNOSIS — M6281 Muscle weakness (generalized): Secondary | ICD-10-CM

## 2017-12-16 DIAGNOSIS — J439 Emphysema, unspecified: Secondary | ICD-10-CM | POA: Diagnosis not present

## 2017-12-16 DIAGNOSIS — I700 Atherosclerosis of aorta: Secondary | ICD-10-CM | POA: Diagnosis not present

## 2017-12-16 DIAGNOSIS — R2689 Other abnormalities of gait and mobility: Secondary | ICD-10-CM

## 2017-12-16 DIAGNOSIS — C3411 Malignant neoplasm of upper lobe, right bronchus or lung: Secondary | ICD-10-CM | POA: Insufficient documentation

## 2017-12-16 DIAGNOSIS — J181 Lobar pneumonia, unspecified organism: Secondary | ICD-10-CM | POA: Diagnosis not present

## 2017-12-16 LAB — POCT I-STAT CREATININE: Creatinine, Ser: 1.3 mg/dL — ABNORMAL HIGH (ref 0.44–1.00)

## 2017-12-16 MED ORDER — IOHEXOL 300 MG/ML  SOLN
60.0000 mL | Freq: Once | INTRAMUSCULAR | Status: AC | PRN
  Administered 2017-12-16: 60 mL via INTRAVENOUS

## 2017-12-16 NOTE — Therapy (Signed)
[place] MAIN [facility] SERVICES [name] [place], [place], [address] Phone: [phone]   Fax:  [phone]  Physical Therapy Treatment  Patient Details  Name: [name] [name] MRN: [id number] Date of Birth: [birth year] Referring Provider: Jerrol Banana, MD   Encounter Date: 12/16/2017  PT End of Session - 12/16/17 1437    Visit Number  16    Number of Visits  25    Date for PT Re-Evaluation  01/11/18    Authorization Type  5/10 progress note (start of reporting period 11/18/17)    PT Start Time  1430    PT Stop Time  1510    PT Time Calculation (min)  40 min    Activity Tolerance  Patient tolerated treatment well;Patient limited by fatigue    Behavior During Therapy  [facility] for tasks assessed/performed       Past Medical History:  Diagnosis Date  . Abnormal CT lung screening 10/17/2015  . COPD (chronic obstructive pulmonary disease) ([place])   . Coronary artery disease, non-occlusive    a. cath 2006: min nonobs CAD; b. cath 12/2010: cath LAD 50%, RCA 60%; c. 08/2013: Minimal luminal irregs, right dominant system with no significant CAD, diffuse luminal irregs noted. Normal EF 55%, no AS or MS.   . Diabetes mellitus   . Hyperlipemia    Followed by Dr. Rosanna Randy  . Hypertension   . Lung cancer ([place])   . Macular degeneration    rt  . Personal history of tobacco use, presenting hazards to health 10/15/2015  . Pneumonia    hx  . Shortness of breath   . Stroke [facility])     Past Surgical History:  Procedure Laterality Date  . ABDOMINAL HYSTERECTOMY    . ANTERIOR CERVICAL DECOMP/DISCECTOMY FUSION N/A 10/19/2013   Procedure: CERVICAL FIVE-SIX ANTERIOR CERVICAL DECOMPRESSION WITH FUSION INTERBODY PROSTHESIS PLATING AND PEEK CAGE;  Surgeon: Ophelia Charter, MD;  Location: [place] NEURO ORS;  Service: Neurosurgery;  Laterality: N/A;  . BACK SURGERY  80's  . BREAST CYST EXCISION Left    left negative   . CARDIAC CATHETERIZATION  05/2004  . CATARACT  EXTRACTION Left   . CHOLECYSTECTOMY    . ENDARTERECTOMY Left 10/24/2014   Procedure: ENDARTERECTOMY CAROTID;  Surgeon: Algernon Huxley, MD;  Location: [facility] ORS;  Service: Vascular;  Laterality: Left;  . ENDOBRONCHIAL ULTRASOUND N/A 11/14/2015   Procedure: ENDOBRONCHIAL ULTRASOUND;  Surgeon: [name], MD;  Location: [facility] ORS;  Service: Cardiopulmonary;  Laterality: N/A;  . PERIPHERAL VASCULAR CATHETERIZATION N/A 12/04/2015   Procedure: Glori Luis Cath Insertion;  Surgeon: Algernon Huxley, MD;  Location: [place] CV LAB;  Service: Cardiovascular;  Laterality: N/A;  . PORTA CATH REMOVAL N/A 05/17/2017   Procedure: PORTA CATH REMOVAL;  Surgeon: Algernon Huxley, MD;  Location: [place] CV LAB;  Service: Cardiovascular;  Laterality: N/A;  . TONSILLECTOMY AND ADENOIDECTOMY    . VESICOVAGINAL FISTULA CLOSURE W/ TAH      There were no vitals filed for this visit.  Subjective Assessment - 12/16/17 1435    Subjective  Pt reports continued soreness on the RLE lateral foreleg. She says it continues to hurt pretty badly particularly at night.     Pertinent History  Pt presents with reports of imbalance and BLE weakness. Pt reports that she had HHPT ~1 year ago and since has become more weak.  Pt reports 6 falls in the past 6 months.  Pt reports she loses her balance  due to her RLE giving way, causing her to fall.  Pt with h/o stroke but denies it affecting one side more than the other.  Pt with h/o lung cancer with last treatment ~2 years ago.  Pt reports 8/10 LBP which she attributes to falls in the past.  Pt is able to ascend/descend steps but this is challenging.   Pt has a ramp but she still goes up the steps.  Husband does the driving, grocery shopping.  Pt helps with cooking and cleaning.  Pt independent with showering and dressing.  Pt ambulates with her SPC at all times.  Pt checks her BP, pulse, O2, blood glucose each day.  She also checks the bottom of her feet 1x/wk due to her peripheral neuropathy  (addressed this session to check 1x/day).     Currently in Pain?  Yes    Pain Score  6     Pain Location  --   Right lower leg       Intervention this session:    Myofascial Release, Trigger point Release, and Active Release Techniques x24 minutes to Right posterolateral leg.   Supine:  -LAQ hamstrings stretch, BUE supporting hip at 90 degrees: 10x5sec bilat -SAQ, knees on bolster: 2x15 c 5lbs bilat -RedTB Resisted ankle DF: 2x15 blat  -heel off step calf stretch 20 sec hold 2x2 reps bilaterally; -hamstring standing stretch with ankle DF 20 sec hold 2x2 reps bilaterally; -hip flexion x15 bilaterally; Seated with 2# ankle weight: -Heel raises 2x15 bilaterally    PT Short Term Goals - 12/14/17 1540      PT SHORT TERM GOAL #1   Title  Pt will independently complete HEP at least 4 days/wk for improved carryover between sessions    Baseline  reports compliance with HEP     Time  2    Period  Weeks    Status  Achieved      PT SHORT TERM GOAL #2   Title  Pt will inspect her feet at least 1x/day for proper foot care due to peripheral neuropathy    Baseline  increasing frequency     Time  2    Period  Weeks    Status  Achieved        PT Long Term Goals - 12/14/17 1540      PT LONG TERM GOAL #1   Title  Pt's ABC scale will improve to at least 50% to demonstrate decreased perception of imbalance    Baseline  20%; 8/15: 45%    Time  4    Period  Weeks    Status  Partially Met    Target Date  01/11/18      PT LONG TERM GOAL #2   Title  Pt's TUG time (with SPC) will decrease to less than or equal to 20 seconds to demonstrate improved mobility    Baseline  32 seconds; 8/15: 31 seconds    Time  4    Period  Weeks    Status  Partially Met    Target Date  01/11/18      PT LONG TERM GOAL #3   Title  Pt's Berg Balance Test will improve to at least 42/56 to demonstrate decreased risk of falling    Baseline  23/56; 8/15: 33/56     Time  4    Period  Weeks    Status   Partially Met    Target Date  01/11/18      PT LONG  TERM GOAL #4   Title  Pt's 68mT (with SPC) will improve to at least 0.6 m/s to demonstrate improved gait speed in the home in community    Baseline  0.26 m/s; 8/15: .42 m/s with AD     Time  4    Period  Weeks    Status  Partially Met    Target Date  01/11/18            Plan - 12/16/17 1501    Clinical Impression Statement  Continued to progress soft tissue work to the Rt lower leg, stretches, and generalized strengthening to address widespread BLE strength deficits. Pt conitnues to demonstrate moderate point tenderness to tight bands in the RLE. Pt requires constant monitoring for form and correct counting of reps during exercse. Pt has improved pain pressure threshold in the lower leg after soft tissue work. Heel raises are very limited d/t weakness, noted poor vertical amplitude after only 10 reps.     Rehab Potential  Good    PT Frequency  2x / week    PT Duration  4 weeks    PT Treatment/Interventions  ADLs/Self Care Home Management;Aquatic Therapy;Cryotherapy;Electrical Stimulation;Iontophoresis 456mml Dexamethasone;Moist Heat;Traction;Ultrasound;DME Instruction;Gait training;Stair training;Functional mobility training;Therapeutic activities;Therapeutic exercise;Balance training;Neuromuscular re-education;Patient/family education;Orthotic Fit/Training;Wheelchair mobility training;Manual techniques;Compression bandaging;Passive range of motion;Dry needling;Energy conservation;Taping    PT Next Visit Plan  Progress HEP and continue with strength and balance training, gait training    PT Home Exercise Plan  seated marching, LAQ    Consulted and Agree with Plan of Care  Patient       Patient will benefit from skilled therapeutic intervention in order to improve the following deficits and impairments:  Abnormal gait, Decreased activity tolerance, Decreased balance, Decreased endurance, Decreased knowledge of use of DME, Decreased  mobility, Decreased range of motion, Decreased safety awareness, Decreased strength, Difficulty walking, Hypomobility, Increased fascial restricitons, Impaired perceived functional ability, Impaired flexibility, Increased muscle spasms, Impaired sensation, Improper body mechanics, Postural dysfunction, Pain  Visit Diagnosis: Unsteadiness on feet  Muscle weakness (generalized)  Other abnormalities of gait and mobility     Problem List Patient Active Problem List   Diagnosis Date Noted  . Malnutrition of moderate degree 03/31/2017  . Acute respiratory failure with hypoxia ([facility]CV[place]12/25/2018  . AKI (acute kidney injury) ([facility]04/02/2017  . Protein-calorie malnutrition, severe 02/08/2016  . Primary cancer of right upper lobe of lung ([facility]08/16/2017  . Abnormal CT lung screening 10/17/2015  . Personal history of tobacco use, presenting hazards to health 10/15/2015  . Chronic vulvitis 09/26/2014  . Allergic reaction 09/26/2014  . Carotid stenosis 08/30/2014  . Cervical nerve root disorder 08/10/2014  . CAD in native artery 08/10/2014  . B12 deficiency 08/10/2014  . Back ache 08/10/2014  . Bronchitis, chronic (HC[facility]05/09/2014  . Diabetes mellitus with polyneuropathy (HCPortal05/09/2014  . Can't get food down 08/10/2014  . Eczema of external ear 08/10/2014  . Accumulation of fluid in tissues 08/10/2014  . Gout 08/10/2014  . Adult hypothyroidism 08/10/2014  . Mononeuritis 08/10/2014  . Muscle ache 08/10/2014  . Disorder of peripheral nervous system 08/10/2014  . Lesion of vulva 08/10/2014  . Cervical spondylosis with radiculopathy 10/19/2013  . COPD exacerbation (HCP[place]12/24/2014  . CAD (coronary artery disease) 06/22/2011  . COPD (chronic obstructive pulmonary disease) with emphysema (HCCircle[place]12/05/2009  . CHEST PAIN UNSPECIFIED 07/22/2009  . HLD (hyperlipidemia) 04/01/2009  . Malaise and fatigue 04/01/2009  . Hyperlipidemia 01/18/2009  . TOBACCO ABUSE 01/18/2009  . HYPERTENSION,  BENIGN 01/18/2009  . CLAUDICATION 01/18/2009  . Pain in limb 01/18/2009  . CAFL (chronic airflow limitation) ([place]) 01/20/2007  . Late effects of cerebrovascular disease 01/10/2007  . Essential (primary) hypertension 12/22/2006   3:12 PM, 12/16/17 Etta Grandchild, PT, DPT Physical Therapist - [place] [phone]      Etta Grandchild 12/16/2017, 3:11 PM  [place] MAIN [facility] SERVICES [address] [place], [place], [address] Phone: [phone]   Fax:  [phone]  Name: [name] [name] MRN: [id number] Date of Birth: 1940/06/10

## 2017-12-18 NOTE — Progress Notes (Signed)
[name] [name]  [phone] [phone]  ID: [name] [name] OB: [birth year]  MR#: [id number]  AOZ#:[id number]  Patient Care Team: Jerrol Banana., MD as PCP - General (Unknown Physician Specialty) [name], MD as Consulting Physician (Cardiology) [name], MD as Consulting Physician (Vascular Surgery) [place], [place] as Consulting Physician (Optometry) [name], Kathlene November, MD as Consulting Physician (Oncology) [name], MD as Referring Physician (Radiation Oncology) Murlean Iba, MD as Consulting Physician (Internal Medicine)  CHIEF COMPLAINT: Clinical stage IIIa small cell lung carcinoma of the right upper lobe lung.  INTERVAL HISTORY: Patient returns to clinic today for further evaluation and discussion of her imaging results.  She continues to have persistent peripheral neuropathy causing issues with balance.  She has occasional dizziness, but no other neurologic complaints.  She continues to have chronic weakness and fatigue. She denies any recent fevers or illnesses.  She denies any chest pain, shortness of breath, cough, or hemoptysis. She has a good appetite and denies weight loss. She denies any nausea, vomiting, constipation, or diarrhea. She has no urinary complaints.  Patient offers no further specific complaints today.  REVIEW OF SYSTEMS:   Review of Systems  Constitutional: Positive for malaise/fatigue. Negative for fever and weight loss.  Respiratory: Negative.  Negative for cough, hemoptysis and shortness of breath.   Cardiovascular: Negative.  Negative for chest pain and leg swelling.  Gastrointestinal: Negative for abdominal pain, melena, nausea and vomiting.  Genitourinary: Negative.  Negative for dysuria.  Musculoskeletal: Negative.  Negative for falls.  Skin: Negative.  Negative for rash.  Neurological: Positive for dizziness, tingling, sensory change and weakness. Negative for focal weakness and  headaches.  Psychiatric/Behavioral: Negative.  The patient is not nervous/anxious.     As per HPI. Otherwise, a complete review of systems is negative.  PAST MEDICAL HISTORY: Past Medical History:  Diagnosis Date  . Abnormal CT lung screening 10/17/2015  . COPD (chronic obstructive pulmonary disease) ([place])   . Coronary artery disease, non-occlusive    a. cath 2006: min nonobs CAD; b. cath 12/2010: cath LAD 50%, RCA 60%; c. 08/2013: Minimal luminal irregs, right dominant system with no significant CAD, diffuse luminal irregs noted. Normal EF 55%, no AS or MS.   . Diabetes mellitus   . Hyperlipemia    Followed by Dr. Rosanna Randy  . Hypertension   . Lung cancer ([place])   . Macular degeneration    rt  . Personal history of tobacco use, presenting hazards to health 10/15/2015  . Pneumonia    hx  . Shortness of breath   . Stroke [facility])     PAST SURGICAL HISTORY: Past Surgical History:  Procedure Laterality Date  . ABDOMINAL HYSTERECTOMY    . ANTERIOR CERVICAL DECOMP/DISCECTOMY FUSION N/A 10/19/2013   Procedure: CERVICAL FIVE-SIX ANTERIOR CERVICAL DECOMPRESSION WITH FUSION INTERBODY PROSTHESIS PLATING AND PEEK CAGE;  Surgeon: Ophelia Charter, MD;  Location: [place] NEURO ORS;  Service: Neurosurgery;  Laterality: N/A;  . BACK SURGERY  80's  . BREAST CYST EXCISION Left    left negative   . CARDIAC CATHETERIZATION  05/2004  . CATARACT EXTRACTION Left   . CHOLECYSTECTOMY    . ENDARTERECTOMY Left 10/24/2014   Procedure: ENDARTERECTOMY CAROTID;  Surgeon: Algernon Huxley, MD;  Location: [facility] ORS;  Service: Vascular;  Laterality: Left;  . ENDOBRONCHIAL ULTRASOUND N/A 11/14/2015   Procedure: ENDOBRONCHIAL ULTRASOUND;  Surgeon: [name], MD;  Location: [facility] ORS;  Service: Cardiopulmonary;  Laterality: N/A;  .  PERIPHERAL VASCULAR CATHETERIZATION N/A 12/04/2015   Procedure: Glori Luis Cath Insertion;  Surgeon: Algernon Huxley, MD;  Location: [place] CV LAB;  Service: Cardiovascular;  Laterality: N/A;  . PORTA  CATH REMOVAL N/A 05/17/2017   Procedure: PORTA CATH REMOVAL;  Surgeon: Algernon Huxley, MD;  Location: [place] CV LAB;  Service: Cardiovascular;  Laterality: N/A;  . TONSILLECTOMY AND ADENOIDECTOMY    . VESICOVAGINAL FISTULA CLOSURE W/ TAH      FAMILY HISTORY: Family History  Problem Relation Age of Onset  . Stroke Mother        Massive  . Heart attack Father        Massive  . Heart attack Brother   . Heart attack Brother   . Lung cancer Maternal Grandfather   . Heart attack Paternal Grandmother        MI       ADVANCED DIRECTIVES (Y/N):  N   HEALTH MAINTENANCE: Social History   Tobacco Use  . Smoking status: Former Smoker    Packs/day: 1.00    Years: 50.00    Pack years: 50.00    Types: Cigarettes    Last attempt to quit: 10/03/2015    Years since quitting: 2.2  . Smokeless tobacco: Never Used  . Tobacco comment: smokes 3 cigs daily 05/06/15. Pt instructed to quit.  Substance Use Topics  . Alcohol use: No    Alcohol/week: 0.0 standard drinks  . Drug use: No     Colonoscopy:  PAP:  Bone density:  Lipid panel:  Allergies  Allergen Reactions  . Coconut Fatty Acids Swelling and Other (See Comments)    Throat swells    Current Outpatient Medications  Medication Sig Dispense Refill  . albuterol (PROVENTIL) (5 MG/ML) 0.5% nebulizer solution Take 0.5 mLs (2.5 mg total) by nebulization every 6 (six) hours as needed for wheezing or shortness of breath. (Patient taking differently: Take 2.5 mg by nebulization daily. ) 100 vial 0  . Blood Glucose Monitoring Suppl (ONE TOUCH ULTRA 2) w/Device KIT 1 each by Does not apply route See admin instructions. 1 each 0  . budesonide-formoterol (SYMBICORT) 160-4.5 MCG/ACT inhaler Inhale 2 puffs into the lungs 2 (two) times daily. 3 Inhaler 3  . doxycycline (VIBRA-TABS) 100 MG tablet Take 1 tablet (100 mg total) by mouth 2 (two) times daily. 20 tablet 0  . fluticasone (FLONASE) 50 MCG/ACT nasal spray Place 2 sprays into both  nostrils daily. 16 g 6  . gabapentin (NEURONTIN) 600 MG tablet TAKE ONE-HALF TABLET BY  MOUTH TWO TIMES DAILY 90 tablet 3  . glimepiride (AMARYL) 2 MG tablet Take 1 tablet (2 mg total) by mouth 2 (two) times daily. 180 tablet 3  . HYDROcodone-acetaminophen (NORCO) 5-325 MG tablet Take 1 tablet by mouth every 6 (six) hours as needed for moderate pain or severe pain. 20 tablet 0  . isosorbide mononitrate (IMDUR) 30 MG 24 hr tablet Take 1 tablet (30 mg total) by mouth daily. 90 tablet 3  . magnesium oxide (MAG-OX) 400 (241.3 MG) MG tablet Take 400 mg by mouth 2 (two) times daily.     . metoprolol tartrate (LOPRESSOR) 25 MG tablet Take 1 tablet (25 mg total) by mouth 2 (two) times daily. 180 tablet 3  . mometasone (ELOCON) 0.1 % cream Apply small amount nightly to each ear 45 g 0  . Multiple Vitamins-Minerals (ICAPS) CAPS Take 1 capsule by mouth 2 (two) times daily.     . nitroGLYCERIN (NITROSTAT) 0.4 MG SL  tablet Place 1 tablet (0.4 mg total) under the tongue every 5 (five) minutes as needed for chest pain. 25 tablet 3  . ondansetron (ZOFRAN) 8 MG tablet Take 1 tablet (8 mg total) by mouth every 8 (eight) hours as needed for nausea or vomiting. 30 tablet 1  . ONE TOUCH ULTRA TEST test strip USE TO CHECK BLOOD SUGAR ONCE DAILY 100 each 11  . pantoprazole (PROTONIX) 40 MG tablet Take 1 tablet (40 mg total) by mouth daily. 90 tablet 3  . triamcinolone cream (KENALOG) 0.1 % Apply 1 application topically 2 (two) times daily. 30 g 0  . LORazepam (ATIVAN) 1 MG tablet Take 1 tablet (1 mg total) by mouth 2 (two) times daily as needed for anxiety. (Patient not taking: Reported on 10/21/2017) 30 tablet 5  . ofloxacin (FLOXIN) 0.3 % OTIC solution PLACE 10 DROPS INTO THE LEFT EAR DAILY. (Patient not taking: Reported on 09/21/2017) 5 mL 0   No current facility-administered medications for this visit.    Facility-Administered Medications Ordered in Other Visits  Medication Dose Route Frequency Provider Last Rate  Last Dose  . ondansetron (ZOFRAN) 8 mg, dexamethasone (DECADRON) 10 mg in sodium chloride 0.9 % 50 mL IVPB   Intravenous Once Lloyd Huger, MD        OBJECTIVE: Vitals:   12/20/17 1007  BP: (!) 146/72  Pulse: 62  Resp: 18  Temp: 98.2 F (36.8 C)     Body mass index is 25.11 kg/m.    ECOG FS:1 - Symptomatic but completely ambulatory  General: Well-developed, well-nourished, no acute distress.  Sitting in a wheelchair. Eyes: Pink conjunctiva, anicteric sclera. HEENT: Normocephalic, moist mucous membranes. Lungs: Clear to auscultation bilaterally. Heart: Regular rate and rhythm. No rubs, murmurs, or gallops. Abdomen: Soft, nontender, nondistended. No organomegaly noted, normoactive bowel sounds. Musculoskeletal: No edema, cyanosis, or clubbing. Neuro: Alert, answering all questions appropriately. Cranial nerves grossly intact. Skin: No rashes or petechiae noted. Psych: Normal affect.  LAB RESULTS:  Lab Results  Component Value Date   NA 142 09/21/2017   K 4.6 09/21/2017   CL 101 09/21/2017   CO2 27 09/21/2017   GLUCOSE 160 (H) 09/21/2017   BUN 22 09/21/2017   CREATININE 1.30 (H) 12/16/2017   CALCIUM 9.4 09/21/2017   PROT 7.0 09/21/2017   ALBUMIN 4.6 09/21/2017   AST 13 09/21/2017   ALT 15 09/21/2017   ALKPHOS 85 09/21/2017   BILITOT 0.3 09/21/2017   GFRNONAA 36 (L) 09/21/2017   GFRAA 42 (L) 09/21/2017    Lab Results  Component Value Date   WBC 7.1 09/21/2017   NEUTROABS 5.3 09/21/2017   HGB 11.6 09/21/2017   HCT 34.7 09/21/2017   MCV 93 09/21/2017   PLT 234 09/21/2017     STUDIES: Ct Chest W Contrast  Result Date: 12/16/2017 CLINICAL DATA:  Patient with history of small cell lung carcinoma right upper lobe. EXAM: CT CHEST WITH CONTRAST TECHNIQUE: Multidetector CT imaging of the chest was performed during intravenous contrast administration. CONTRAST:  38m OMNIPAQUE IOHEXOL 300 MG/ML  SOLN COMPARISON:  Chest CT 09/07/2017 FINDINGS: Cardiovascular:  Normal heart size. Coronary arterial and thoracic aortic vascular calcifications. Trace fluid superior pericardial recess. Mediastinum/Nodes: No enlarged axillary, mediastinal or hilar lymphadenopathy. Normal appearance of the esophagus. Lungs/Pleura: Central airways are patent. Centrilobular and paraseptal emphysematous change. Subpleural patchy consolidation within the superior segment right lower lobe is similar when compared to prior measuring 3.7 x 2.2 cm (image 62; series 3). Stable subpleural right upper  lobe nodule measuring 5 mm (image 31; series 3). Stable 3 mm nodule in the lingula (image 92; series 3). No pleural effusion or pneumothorax. Upper Abdomen: Status post cholecystectomy. Fatty deposition adjacent to the falciform ligament. Stable bilateral adrenal adenomas. Musculoskeletal: Thoracic spine degenerative changes. No aggressive or acute appearing osseous lesions. IMPRESSION: 1. Grossly unchanged focal area of consolidation within the superior segment right lower lobe. Given the interval stability, this is still indeterminate and may represent an infectious/inflammatory process. Malignant etiology cannot be entirely excluded. Recommend continued attention on follow-up. 2. Additional small bilateral pulmonary nodules are stable. 3. Aortic Atherosclerosis (ICD10-I70.0) and Emphysema (ICD10-J43.9). Electronically Signed   By: Lovey Newcomer M.D.   On: 12/16/2017 14:35    ASSESSMENT: Clinical stage IIIa small cell lung carcinoma of the right upper lobe lung.  PLAN:    1. Clinical stage IIIa small cell lung carcinoma of the right upper lobe lung: Patient completed cycle 3 of cisplatin and etoposide on October 24-26, 2017. Given patient's extensive hospitalization after cycle 3, treatment was discontinued.  Previously, MRI of the brain was negative and patient has completed PCI.  CT scan results from December 16, 2017 reviewed independently report as above with no evidence of progressive or  recurrent disease.  No intervention is needed at this time.  Return to clinic in 6 months with repeat imaging and further evaluation. 2.  Peripheral neuropathy: Continue to physical therapy as directed.  Patient was also given a referral for acupuncture.  Essentially stable CT scan results from September 07, 2017 reviewed independently and reported as above with interval development of focal airspace consolidation in the right lower lobe.  This appears to be inflammatory or infectious etiology.  No obvious evidence of recurrence.  Per radiology recommendations, will repeat CT scan in 3 months.  Patient will follow-up 1 to 2 days later to discuss the results.  I spent a total of 30 minutes face-to-face with the patient of which greater than 50% of the visit was spent in counseling and coordination of care as detailed above.   Patient expressed understanding and was in agreement with this plan. She also understands that She can call clinic at any time with any questions, concerns, or complaints.   Cancer Staging Primary cancer of right upper lobe of lung [address] [facility]) Staging form: Lung, AJCC 7th Edition - Clinical stage from 11/26/2015: Stage IIIA (T1a, N2, M0) - Signed by Lloyd Huger, MD on 11/26/2015   Lloyd Huger, MD   12/20/2017 11:51 AM

## 2017-12-20 ENCOUNTER — Encounter: Payer: Self-pay | Admitting: Oncology

## 2017-12-20 ENCOUNTER — Inpatient Hospital Stay: Payer: Medicare Other | Attending: Oncology | Admitting: Oncology

## 2017-12-20 ENCOUNTER — Ambulatory Visit: Payer: Self-pay | Admitting: Oncology

## 2017-12-20 VITALS — BP 146/72 | HR 62 | Temp 98.2°F | Resp 18 | Wt 146.3 lb

## 2017-12-20 DIAGNOSIS — Z87891 Personal history of nicotine dependence: Secondary | ICD-10-CM | POA: Insufficient documentation

## 2017-12-20 DIAGNOSIS — C3411 Malignant neoplasm of upper lobe, right bronchus or lung: Secondary | ICD-10-CM | POA: Insufficient documentation

## 2017-12-20 DIAGNOSIS — G620 Drug-induced polyneuropathy: Secondary | ICD-10-CM

## 2017-12-20 DIAGNOSIS — R531 Weakness: Secondary | ICD-10-CM | POA: Diagnosis not present

## 2017-12-20 DIAGNOSIS — R5383 Other fatigue: Secondary | ICD-10-CM | POA: Insufficient documentation

## 2017-12-20 NOTE — Progress Notes (Signed)
Patient here today for CT results. 

## 2017-12-21 ENCOUNTER — Ambulatory Visit: Payer: Medicare Other

## 2017-12-21 VITALS — BP 147/58 | HR 66

## 2017-12-21 DIAGNOSIS — R2681 Unsteadiness on feet: Secondary | ICD-10-CM

## 2017-12-21 DIAGNOSIS — M6281 Muscle weakness (generalized): Secondary | ICD-10-CM | POA: Diagnosis not present

## 2017-12-21 DIAGNOSIS — R2689 Other abnormalities of gait and mobility: Secondary | ICD-10-CM | POA: Diagnosis not present

## 2017-12-21 NOTE — Therapy (Signed)
[place] MAIN [facility] SERVICES [place], [place], [address] Phone: [phone]   Fax:  [phone]  Physical Therapy Treatment  Patient Details  Name: [name] [name] MRN: [id number] Date of Birth: 12/28/1940 Referring Provider: Jerrol Banana, MD   Encounter Date: 12/21/2017  PT End of Session - 12/21/17 1526    Visit Number  17    Number of Visits  25    Date for PT Re-Evaluation  01/11/18    Authorization Type  6/10 progress note (last goals 12/14/17)    PT Start Time  1517    PT Stop Time  1600    PT Time Calculation (min)  43 min    Activity Tolerance  Patient tolerated treatment well;Patient limited by fatigue    Behavior During Therapy  [facility] for tasks assessed/performed       Past Medical History:  Diagnosis Date  . Abnormal CT lung screening 10/17/2015  . COPD (chronic obstructive pulmonary disease) ([place])   . Coronary artery disease, non-occlusive    a. cath 2006: min nonobs CAD; b. cath 12/2010: cath LAD 50%, RCA 60%; c. 08/2013: Minimal luminal irregs, right dominant system with no significant CAD, diffuse luminal irregs noted. Normal EF 55%, no AS or MS.   . Diabetes mellitus   . Hyperlipemia    Followed by Dr. Rosanna Randy  . Hypertension   . Lung cancer ([place])   . Macular degeneration    rt  . Personal history of tobacco use, presenting hazards to health 10/15/2015  . Pneumonia    hx  . Shortness of breath   . Stroke [facility])     Past Surgical History:  Procedure Laterality Date  . ABDOMINAL HYSTERECTOMY    . ANTERIOR CERVICAL DECOMP/DISCECTOMY FUSION N/A 10/19/2013   Procedure: CERVICAL FIVE-SIX ANTERIOR CERVICAL DECOMPRESSION WITH FUSION INTERBODY PROSTHESIS PLATING AND PEEK CAGE;  Surgeon: Ophelia Charter, MD;  Location: [name];  Service: Neurosurgery;  Laterality: N/A;  . BACK SURGERY  80's  . BREAST CYST EXCISION Left    left negative   . CARDIAC CATHETERIZATION  05/2004  . CATARACT EXTRACTION  Left   . CHOLECYSTECTOMY    . ENDARTERECTOMY Left 10/24/2014   Procedure: ENDARTERECTOMY CAROTID;  Surgeon: Algernon Huxley, MD;  Location: [facility] ORS;  Service: Vascular;  Laterality: Left;  . ENDOBRONCHIAL ULTRASOUND N/A 11/14/2015   Procedure: ENDOBRONCHIAL ULTRASOUND;  Surgeon: [name], MD;  Location: [facility] ORS;  Service: Cardiopulmonary;  Laterality: N/A;  . PERIPHERAL VASCULAR CATHETERIZATION N/A 12/04/2015   Procedure: Glori Luis Cath Insertion;  Surgeon: Algernon Huxley, MD;  Location: [place] CV LAB;  Service: Cardiovascular;  Laterality: N/A;  . PORTA CATH REMOVAL N/A 05/17/2017   Procedure: PORTA CATH REMOVAL;  Surgeon: Algernon Huxley, MD;  Location: [place] CV LAB;  Service: Cardiovascular;  Laterality: N/A;  . TONSILLECTOMY AND ADENOIDECTOMY    . VESICOVAGINAL FISTULA CLOSURE W/ TAH      Vitals:   12/21/17 1522  BP: (!) 147/58  Pulse: 66    Subjective Assessment - 12/21/17 1525    Subjective  Pt reports continued soreness on the RLE. She saw her oncologist today and he gave her a referral for acupuncture. She has no specific questions at this time.     Pertinent History  Pt presents with reports of imbalance and BLE weakness. Pt reports that she had HHPT ~1 year ago and since has become more weak.  Pt reports 6 falls  in the past 6 months.  Pt reports she loses her balance due to her RLE giving way, causing her to fall.  Pt with h/o stroke but denies it affecting one side more than the other.  Pt with h/o lung cancer with last treatment ~2 years ago.  Pt reports 8/10 LBP which she attributes to falls in the past.  Pt is able to ascend/descend steps but this is challenging.   Pt has a ramp but she still goes up the steps.  Husband does the driving, grocery shopping.  Pt helps with cooking and cleaning.  Pt independent with showering and dressing.  Pt ambulates with her SPC at all times.  Pt checks her BP, pulse, O2, blood glucose each day.  She also checks the bottom of her feet 1x/wk due  to her peripheral neuropathy (addressed this session to check 1x/day).     Currently in Pain?  Yes    Pain Score  7     Pain Location  Calf    Pain Orientation  Right    Pain Descriptors / Indicators  Sore;Cramping    Pain Type  Acute pain    Pain Onset  1 to 4 weeks ago    Pain Frequency  Constant    Multiple Pain Sites  No        TREATMENT:  Manual Therapy  Instructed patient in LE stretches to reduce calf tightness: Standing on step heel off step calf stretch 30 sec hold x 2 reps bilaterally; Standing hamstring stretch with ankle DF 30 sec hold x 2 reps RLE ;  Ther-ex  Warm up on Nustep during history BUE/BLE level 2 x 3 min (1 minute unbilled); Standing hip flexion marches with 2# ankle weight x 10 bilateral; Standing hip extension with 2# ankle weight x 10 bilateral; Standing hip abduction with 2# ankle weight x 10 bilateral; Standing HS curls with 2# ankle weight x 10 bilateral; Standing mini squats x 10; Seated LAQ with 2# ankle weights x 10 bilateral; Standing heel/toe raises x 10 each without UE support;  Pt educated throughout session about proper posture and technique with exercises. Improved exercise technique, movement at target joints, use of target muscles after min to mod verbal, visual, tactile cues.   Pt reports continued calf tenderness in RLE upon arrival. She is tender to palpation. Her oncologist gave her a referral for acupuncture but she is concerned that her insurance may not cover this service. It is possible that she might benefit from dry needling in future sessions as determined by primary therapist. Continued to progress soft tissue work to the Rt lower leg, stretches, and generalized strengthening to address widespread BLE strength deficits. Pt encouraged to continue HEP and follow-up as scheduled. Pt will benefit from PT services to address deficits in strength, balance, and mobility in order to return to full function at home.                     PT Short Term Goals - 12/14/17 1540      PT SHORT TERM GOAL #1   Title  Pt will independently complete HEP at least 4 days/wk for improved carryover between sessions    Baseline  reports compliance with HEP     Time  2    Period  Weeks    Status  Achieved      PT SHORT TERM GOAL #2   Title  Pt will inspect her feet at least 1x/day for proper  foot care due to peripheral neuropathy    Baseline  increasing frequency     Time  2    Period  Weeks    Status  Achieved        PT Long Term Goals - 12/14/17 1540      PT LONG TERM GOAL #1   Title  Pt's ABC scale will improve to at least 50% to demonstrate decreased perception of imbalance    Baseline  20%; 8/15: 45%    Time  4    Period  Weeks    Status  Partially Met    Target Date  01/11/18      PT LONG TERM GOAL #2   Title  Pt's TUG time (with SPC) will decrease to less than or equal to 20 seconds to demonstrate improved mobility    Baseline  32 seconds; 8/15: 31 seconds    Time  4    Period  Weeks    Status  Partially Met    Target Date  01/11/18      PT LONG TERM GOAL #3   Title  Pt's Berg Balance Test will improve to at least 42/56 to demonstrate decreased risk of falling    Baseline  23/56; 8/15: 33/56     Time  4    Period  Weeks    Status  Partially Met    Target Date  01/11/18      PT LONG TERM GOAL #4   Title  Pt's 79mT (with SPC) will improve to at least 0.6 m/s to demonstrate improved gait speed in the home in community    Baseline  0.26 m/s; 8/15: .42 m/s with AD     Time  4    Period  Weeks    Status  Partially Met    Target Date  01/11/18            Plan - 12/21/17 1541    Clinical Impression Statement  Pt reports continued calf tenderness in RLE upon arrival. She is tender to palpation. Her oncologist gave her a referral for acupuncture but she is concerned that her insurance may not cover this service. It is possible that she might benefit from dry needling in  future sessions as determined by primary therapist. Continued to progress soft tissue work to the Rt lower leg, stretches, and generalized strengthening to address widespread BLE strength deficits. Pt encouraged to continue HEP and follow-up as scheduled. Pt will benefit from PT services to address deficits in strength, balance, and mobility in order to return to full function at home.      Rehab Potential  Good    PT Frequency  2x / week    PT Duration  4 weeks    PT Treatment/Interventions  ADLs/Self Care Home Management;Aquatic Therapy;Cryotherapy;Electrical Stimulation;Iontophoresis 434mml Dexamethasone;Moist Heat;Traction;Ultrasound;DME Instruction;Gait training;Stair training;Functional mobility training;Therapeutic activities;Therapeutic exercise;Balance training;Neuromuscular re-education;Patient/family education;Orthotic Fit/Training;Wheelchair mobility training;Manual techniques;Compression bandaging;Passive range of motion;Dry needling;Energy conservation;Taping    PT Next Visit Plan  Progress HEP and continue with strength and balance training, gait training    PT Home Exercise Plan  seated marching, LAQ    Consulted and Agree with Plan of Care  Patient       Patient will benefit from skilled therapeutic intervention in order to improve the following deficits and impairments:  Abnormal gait, Decreased activity tolerance, Decreased balance, Decreased endurance, Decreased knowledge of use of DME, Decreased mobility, Decreased range of motion, Decreased safety awareness, Decreased strength, Difficulty walking, Hypomobility,  Increased fascial restricitons, Impaired perceived functional ability, Impaired flexibility, Increased muscle spasms, Impaired sensation, Improper body mechanics, Postural dysfunction, Pain  Visit Diagnosis: Unsteadiness on feet  Muscle weakness (generalized)     Problem List Patient Active Problem List   Diagnosis Date Noted  . Malnutrition of moderate degree  03/31/2017  . Acute respiratory failure with hypoxia ([place]) 03/30/2017  . AKI (acute kidney injury) ([place]) 07/15/2016  . Protein-calorie malnutrition, severe 02/08/2016  . Primary cancer of right upper lobe of lung ([place]) 11/20/2015  . Abnormal CT lung screening 10/17/2015  . Personal history of tobacco use, presenting hazards to health 10/15/2015  . Chronic vulvitis 09/26/2014  . Allergic reaction 09/26/2014  . Carotid stenosis 08/30/2014  . Cervical nerve root disorder 08/10/2014  . CAD in native artery 08/10/2014  . B12 deficiency 08/10/2014  . Back ache 08/10/2014  . Bronchitis, chronic ([place]) 08/10/2014  . Diabetes mellitus with polyneuropathy ([place]) 08/10/2014  . Can't get food down 08/10/2014  . Eczema of external ear 08/10/2014  . Accumulation of fluid in tissues 08/10/2014  . Gout 08/10/2014  . Adult hypothyroidism 08/10/2014  . Mononeuritis 08/10/2014  . Muscle ache 08/10/2014  . Disorder of peripheral nervous system 08/10/2014  . Lesion of vulva 08/10/2014  . Cervical spondylosis with radiculopathy 10/19/2013  . COPD exacerbation ([place]) 03/29/2013  . CAD (coronary artery disease) 06/22/2011  . COPD (chronic obstructive pulmonary disease) with emphysema ([place]) 03/07/2010  . CHEST PAIN UNSPECIFIED 07/22/2009  . HLD (hyperlipidemia) 04/01/2009  . Malaise and fatigue 04/01/2009  . Hyperlipidemia 01/18/2009  . TOBACCO ABUSE 01/18/2009  . HYPERTENSION, BENIGN 01/18/2009  . CLAUDICATION 01/18/2009  . Pain in limb 01/18/2009  . CAFL (chronic airflow limitation) ([place]) 01/20/2007  . Late effects of cerebrovascular disease 01/10/2007  . Essential (primary) hypertension 12/22/2006   Phillips Grout PT, DPT, GCS  Shalaina Guardiola 12/22/2017, 5:25 PM  [place] MAIN [facility] SERVICES [address] [place], [place], [address] Phone: [phone]   Fax:  [phone]  Name: SAMARY SHATZ MRN: [id number] Date of Birth: August 01, 1940

## 2017-12-23 ENCOUNTER — Encounter: Payer: Self-pay | Admitting: Physical Therapy

## 2017-12-23 ENCOUNTER — Ambulatory Visit: Payer: Medicare Other | Admitting: Physical Therapy

## 2017-12-23 DIAGNOSIS — R2681 Unsteadiness on feet: Secondary | ICD-10-CM

## 2017-12-23 DIAGNOSIS — M6281 Muscle weakness (generalized): Secondary | ICD-10-CM | POA: Diagnosis not present

## 2017-12-23 DIAGNOSIS — R2689 Other abnormalities of gait and mobility: Secondary | ICD-10-CM | POA: Diagnosis not present

## 2017-12-23 NOTE — Therapy (Addendum)
[place] MAIN [facility] SERVICES [address] [place], [place], [address] Phone: [phone]   Fax:  [phone]  Physical Therapy Treatment  Patient Details  Name: [name] [name] MRN: [id number] Date of Birth: 1940/09/21 Referring Provider: Jerrol Banana, MD   Encounter Date: 12/23/2017  PT End of Session - 12/23/17 1404    Visit Number  18    Number of Visits  25    Date for PT Re-Evaluation  01/11/18    Authorization Type  8/10 progress note (last goals 12/14/17)    PT Start Time  1345    PT Stop Time  1430    PT Time Calculation (min)  45 min    Equipment Utilized During Treatment  Gait belt    Activity Tolerance  Patient tolerated treatment well;Patient limited by pain    Behavior During Therapy  [facility] for tasks assessed/performed       Past Medical History:  Diagnosis Date  . Abnormal CT lung screening 10/17/2015  . COPD (chronic obstructive pulmonary disease) ([place])   . Coronary artery disease, non-occlusive    a. cath 2006: min nonobs CAD; b. cath 12/2010: cath LAD 50%, RCA 60%; c. 08/2013: Minimal luminal irregs, right dominant system with no significant CAD, diffuse luminal irregs noted. Normal EF 55%, no AS or MS.   . Diabetes mellitus   . Hyperlipemia    Followed by Dr. Rosanna Randy  . Hypertension   . Lung cancer ([place])   . Macular degeneration    rt  . Personal history of tobacco use, presenting hazards to health 10/15/2015  . Pneumonia    hx  . Shortness of breath   . Stroke [facility])     Past Surgical History:  Procedure Laterality Date  . ABDOMINAL HYSTERECTOMY    . ANTERIOR CERVICAL DECOMP/DISCECTOMY FUSION N/A 10/19/2013   Procedure: CERVICAL FIVE-SIX ANTERIOR CERVICAL DECOMPRESSION WITH FUSION INTERBODY PROSTHESIS PLATING AND PEEK CAGE;  Surgeon: Ophelia Charter, MD;  Location: [place] NEURO ORS;  Service: Neurosurgery;  Laterality: N/A;  . BACK SURGERY  80's  . BREAST CYST EXCISION Left    left negative   . CARDIAC  CATHETERIZATION  05/2004  . CATARACT EXTRACTION Left   . CHOLECYSTECTOMY    . ENDARTERECTOMY Left 10/24/2014   Procedure: ENDARTERECTOMY CAROTID;  Surgeon: Algernon Huxley, MD;  Location: [facility] ORS;  Service: Vascular;  Laterality: Left;  . ENDOBRONCHIAL ULTRASOUND N/A 11/14/2015   Procedure: ENDOBRONCHIAL ULTRASOUND;  Surgeon: [name], MD;  Location: [facility] ORS;  Service: Cardiopulmonary;  Laterality: N/A;  . PERIPHERAL VASCULAR CATHETERIZATION N/A 12/04/2015   Procedure: Glori Luis Cath Insertion;  Surgeon: Algernon Huxley, MD;  Location: [place] CV LAB;  Service: Cardiovascular;  Laterality: N/A;  . PORTA CATH REMOVAL N/A 05/17/2017   Procedure: PORTA CATH REMOVAL;  Surgeon: Algernon Huxley, MD;  Location: [place] CV LAB;  Service: Cardiovascular;  Laterality: N/A;  . TONSILLECTOMY AND ADENOIDECTOMY    . VESICOVAGINAL FISTULA CLOSURE W/ TAH      There were no vitals filed for this visit.  Subjective Assessment - 12/23/17 1402    Subjective  Patient reports she is doing alright today; still has soreness in RLE calf area. Pt reports she has been using her broom stick to roll out her calf and has husband put [place] on it for pain.     Pertinent History  Pt presents with reports of imbalance and BLE weakness. Pt reports that she had HHPT ~1  year ago and since has become more weak.  Pt reports 6 falls in the past 6 months.  Pt reports she loses her balance due to her RLE giving way, causing her to fall.  Pt with h/o stroke but denies it affecting one side more than the other.  Pt with h/o lung cancer with last treatment ~2 years ago.  Pt reports 8/10 LBP which she attributes to falls in the past.  Pt is able to ascend/descend steps but this is challenging.   Pt has a ramp but she still goes up the steps.  Husband does the driving, grocery shopping.  Pt helps with cooking and cleaning.  Pt independent with showering and dressing.  Pt ambulates with her SPC at all times.  Pt checks her BP, pulse, O2, blood  glucose each day.  She also checks the bottom of her feet 1x/wk due to her peripheral neuropathy (addressed this session to check 1x/day).     How long can you sit comfortably?  no issues    How long can you stand comfortably?  10 minutes    How long can you walk comfortably?  household distances    Patient Stated Goals  Pt would like to be able to walk farther, no falls, up and down steps    Currently in Pain?  Yes    Pain Score  8     Pain Location  Calf    Pain Orientation  Right    Pain Descriptors / Indicators  Sore;Cramping    Pain Type  Acute pain    Pain Onset  1 to 4 weeks ago    Pain Frequency  Constant    Multiple Pain Sites  No       Treatment PT assisted calf stretch in sitting with pt's foot up on PT's knee x30 sec holds x3 reps on RLE only  Rolling stick massage to R calf area x12 min secondary to pt complaints of continued pain and discomfort in this area    Therex: -Bridges x15 reps -SLR with 2# ankle weights x10 reps bilaterally -Alternating marching, hooklying, with 2# ankle weights x15 reps bilaterally -Seated, hip adduction with pillow squeezes x15 reps with 3 sec holds -Seated, LAQs x15 reps x3 sec holds with 2# ankle weights   Returned for further rolling stick massage to R calf area x6 min following exercise to increase pain relief in that area at the end of therapy session             PT Education - 12/23/17 1403    Education Details  stretches, soft tissue massage, exercise form    Person(s) Educated  Patient    Methods  Explanation;Demonstration;Verbal cues    Comprehension  Verbalized understanding;Returned demonstration;Verbal cues required;Need further instruction       PT Short Term Goals - 12/14/17 1540      PT SHORT TERM GOAL #1   Title  Pt will independently complete HEP at least 4 days/wk for improved carryover between sessions    Baseline  reports compliance with HEP     Time  2    Period  Weeks    Status  Achieved      PT  SHORT TERM GOAL #2   Title  Pt will inspect her feet at least 1x/day for proper foot care due to peripheral neuropathy    Baseline  increasing frequency     Time  2    Period  Weeks    Status  Achieved        PT Long Term Goals - 12/14/17 1540      PT LONG TERM GOAL #1   Title  Pt's ABC scale will improve to at least 50% to demonstrate decreased perception of imbalance    Baseline  20%; 8/15: 45%    Time  4    Period  Weeks    Status  Partially Met    Target Date  01/11/18      PT LONG TERM GOAL #2   Title  Pt's TUG time (with SPC) will decrease to less than or equal to 20 seconds to demonstrate improved mobility    Baseline  32 seconds; 8/15: 31 seconds    Time  4    Period  Weeks    Status  Partially Met    Target Date  01/11/18      PT LONG TERM GOAL #3   Title  Pt's Berg Balance Test will improve to at least 42/56 to demonstrate decreased risk of falling    Baseline  23/56; 8/15: 33/56     Time  4    Period  Weeks    Status  Partially Met    Target Date  01/11/18      PT LONG TERM GOAL #4   Title  Pt's 48mT (with SPC) will improve to at least 0.6 m/s to demonstrate improved gait speed in the home in community    Baseline  0.26 m/s; 8/15: .42 m/s with AD     Time  4    Period  Weeks    Status  Partially Met    Target Date  01/11/18            Plan - 12/23/17 1416    Clinical Impression Statement  Pt reports continued calf tenderness and soreness in RLE upon arrival. She is tender to palpation along the medial and lateral parts of her gastroc musculature. Pt performed supine and seated LE strengthening due to prolonged standing and walking increasing pt's R calf pain. Continued with soft tissue massage to R calf including rolling stick massage and stretching R calf muscles. Pt required VCs for proper technique in all strengthening exercises as well as VCs to relax calf when stretching. Pt will continue to benefit from PT interventions for improvements in  balance, strength, and gait safety.     Rehab Potential  Good    PT Frequency  2x / week    PT Duration  4 weeks    PT Treatment/Interventions  ADLs/Self Care Home Management;Aquatic Therapy;Cryotherapy;Electrical Stimulation;Iontophoresis 440mml Dexamethasone;Moist Heat;Traction;Ultrasound;DME Instruction;Gait training;Stair training;Functional mobility training;Therapeutic activities;Therapeutic exercise;Balance training;Neuromuscular re-education;Patient/family education;Orthotic Fit/Training;Wheelchair mobility training;Manual techniques;Compression bandaging;Passive range of motion;Dry needling;Energy conservation;Taping    PT Next Visit Plan  Progress HEP and continue with strength and balance training, gait training    PT Home Exercise Plan  seated marching, LAQ    Consulted and Agree with Plan of Care  Patient       Patient will benefit from skilled therapeutic intervention in order to improve the following deficits and impairments:  Abnormal gait, Decreased activity tolerance, Decreased balance, Decreased endurance, Decreased knowledge of use of DME, Decreased mobility, Decreased range of motion, Decreased safety awareness, Decreased strength, Difficulty walking, Hypomobility, Increased fascial restricitons, Impaired perceived functional ability, Impaired flexibility, Increased muscle spasms, Impaired sensation, Improper body mechanics, Postural dysfunction, Pain  Visit Diagnosis: Unsteadiness on feet  Muscle weakness (generalized)  Other abnormalities of gait and mobility  Problem List Patient Active Problem List   Diagnosis Date Noted  . Malnutrition of moderate degree 03/31/2017  . Acute respiratory failure with hypoxia ([place]) 03/30/2017  . AKI (acute kidney injury) ([place]) 07/15/2016  . Protein-calorie malnutrition, severe 02/08/2016  . Primary cancer of right upper lobe of lung ([place]) 11/20/2015  . Abnormal CT lung screening 10/17/2015  . Personal history of tobacco use,  presenting hazards to health 10/15/2015  . Chronic vulvitis 09/26/2014  . Allergic reaction 09/26/2014  . Carotid stenosis 08/30/2014  . Cervical nerve root disorder 08/10/2014  . CAD in native artery 08/10/2014  . B12 deficiency 08/10/2014  . Back ache 08/10/2014  . Bronchitis, chronic ([place]) 08/10/2014  . Diabetes mellitus with polyneuropathy ([place]) 08/10/2014  . Can't get food down 08/10/2014  . Eczema of external ear 08/10/2014  . Accumulation of fluid in tissues 08/10/2014  . Gout 08/10/2014  . Adult hypothyroidism 08/10/2014  . Mononeuritis 08/10/2014  . Muscle ache 08/10/2014  . Disorder of peripheral nervous system 08/10/2014  . Lesion of vulva 08/10/2014  . Cervical spondylosis with radiculopathy 10/19/2013  . COPD exacerbation ([place]) 03/29/2013  . CAD (coronary artery disease) 06/22/2011  . COPD (chronic obstructive pulmonary disease) with emphysema ([place]) 03/07/2010  . CHEST PAIN UNSPECIFIED 07/22/2009  . HLD (hyperlipidemia) 04/01/2009  . Malaise and fatigue 04/01/2009  . Hyperlipidemia 01/18/2009  . TOBACCO ABUSE 01/18/2009  . HYPERTENSION, BENIGN 01/18/2009  . CLAUDICATION 01/18/2009  . Pain in limb 01/18/2009  . CAFL (chronic airflow limitation) ([place]) 01/20/2007  . Late effects of cerebrovascular disease 01/10/2007  . Essential (primary) hypertension 12/22/2006   Harriet Masson, SPT This entire session was performed under direct supervision and direction of a licensed therapist/therapist assistant . I have personally read, edited and approve of the note as written.  Trotter,Margaret PT, DPT 12/23/2017, 4:41 PM  [place] MAIN [facility] SERVICES [address] [place], [place], [address] Phone: [phone]   Fax:  [phone]  Name: [name] [name] MRN: [id number] Date of Birth: May 05, 1940

## 2017-12-24 DIAGNOSIS — R0602 Shortness of breath: Secondary | ICD-10-CM | POA: Diagnosis not present

## 2017-12-24 DIAGNOSIS — J449 Chronic obstructive pulmonary disease, unspecified: Secondary | ICD-10-CM | POA: Diagnosis not present

## 2017-12-26 DIAGNOSIS — J449 Chronic obstructive pulmonary disease, unspecified: Secondary | ICD-10-CM | POA: Diagnosis not present

## 2017-12-28 ENCOUNTER — Encounter: Payer: Self-pay | Admitting: Physical Therapy

## 2017-12-28 ENCOUNTER — Ambulatory Visit: Payer: Medicare Other | Admitting: Physical Therapy

## 2017-12-28 VITALS — BP 148/62 | HR 71

## 2017-12-28 DIAGNOSIS — R2681 Unsteadiness on feet: Secondary | ICD-10-CM | POA: Diagnosis not present

## 2017-12-28 DIAGNOSIS — R2689 Other abnormalities of gait and mobility: Secondary | ICD-10-CM | POA: Diagnosis not present

## 2017-12-28 DIAGNOSIS — M6281 Muscle weakness (generalized): Secondary | ICD-10-CM | POA: Diagnosis not present

## 2017-12-28 NOTE — Therapy (Signed)
[place] MAIN [facility] SERVICES [address] [place], [place], [address] Phone: [phone]   Fax:  [phone]  Physical Therapy Treatment  Patient Details  Name: [name] [name] MRN: [id number] Date of Birth: 06-08-1940 Referring Provider: Jerrol Banana, MD   Encounter Date: 12/28/2017  PT End of Session - 12/28/17 1514    Visit Number  19    Number of Visits  25    Date for PT Re-Evaluation  01/11/18    Authorization Type  9/10 progress note (last goals 12/14/17)    PT Start Time  1517    PT Stop Time  1600    PT Time Calculation (min)  43 min    Equipment Utilized During Treatment  Gait belt    Activity Tolerance  Patient tolerated treatment well;Patient limited by pain    Behavior During Therapy  [facility] for tasks assessed/performed       Past Medical History:  Diagnosis Date  . Abnormal CT lung screening 10/17/2015  . COPD (chronic obstructive pulmonary disease) ([place])   . Coronary artery disease, non-occlusive    a. cath 2006: min nonobs CAD; b. cath 12/2010: cath LAD 50%, RCA 60%; c. 08/2013: Minimal luminal irregs, right dominant system with no significant CAD, diffuse luminal irregs noted. Normal EF 55%, no AS or MS.   . Diabetes mellitus   . Hyperlipemia    Followed by Dr. Rosanna Randy  . Hypertension   . Lung cancer ([place])   . Macular degeneration    rt  . Personal history of tobacco use, presenting hazards to health 10/15/2015  . Pneumonia    hx  . Shortness of breath   . Stroke [facility])     Past Surgical History:  Procedure Laterality Date  . ABDOMINAL HYSTERECTOMY    . ANTERIOR CERVICAL DECOMP/DISCECTOMY FUSION N/A 10/19/2013   Procedure: CERVICAL FIVE-SIX ANTERIOR CERVICAL DECOMPRESSION WITH FUSION INTERBODY PROSTHESIS PLATING AND PEEK CAGE;  Surgeon: Ophelia Charter, MD;  Location: [place] NEURO ORS;  Service: Neurosurgery;  Laterality: N/A;  . BACK SURGERY  80's  . BREAST CYST EXCISION Left    left negative   . CARDIAC  CATHETERIZATION  05/2004  . CATARACT EXTRACTION Left   . CHOLECYSTECTOMY    . ENDARTERECTOMY Left 10/24/2014   Procedure: ENDARTERECTOMY CAROTID;  Surgeon: Algernon Huxley, MD;  Location: [facility] ORS;  Service: Vascular;  Laterality: Left;  . ENDOBRONCHIAL ULTRASOUND N/A 11/14/2015   Procedure: ENDOBRONCHIAL ULTRASOUND;  Surgeon: [name], MD;  Location: [facility] ORS;  Service: Cardiopulmonary;  Laterality: N/A;  . PERIPHERAL VASCULAR CATHETERIZATION N/A 12/04/2015   Procedure: Glori Luis Cath Insertion;  Surgeon: Algernon Huxley, MD;  Location: [place] CV LAB;  Service: Cardiovascular;  Laterality: N/A;  . PORTA CATH REMOVAL N/A 05/17/2017   Procedure: PORTA CATH REMOVAL;  Surgeon: Algernon Huxley, MD;  Location: [place] CV LAB;  Service: Cardiovascular;  Laterality: N/A;  . TONSILLECTOMY AND ADENOIDECTOMY    . VESICOVAGINAL FISTULA CLOSURE W/ TAH      Vitals:   12/28/17 1523  BP: (!) 148/62  Pulse: 71    Subjective Assessment - 12/28/17 1522    Subjective  Pt denies any falls or stumbles since last session.  Pt denies calf pain today.  No new complaints or concerns.     Pertinent History  Pt presents with reports of imbalance and BLE weakness. Pt reports that she had HHPT ~1 year ago and since has become more weak.  Pt  reports 6 falls in the past 6 months.  Pt reports she loses her balance due to her RLE giving way, causing her to fall.  Pt with h/o stroke but denies it affecting one side more than the other.  Pt with h/o lung cancer with last treatment ~2 years ago.  Pt reports 8/10 LBP which she attributes to falls in the past.  Pt is able to ascend/descend steps but this is challenging.   Pt has a ramp but she still goes up the steps.  Husband does the driving, grocery shopping.  Pt helps with cooking and cleaning.  Pt independent with showering and dressing.  Pt ambulates with her SPC at all times.  Pt checks her BP, pulse, O2, blood glucose each day.  She also checks the bottom of her feet 1x/wk  due to her peripheral neuropathy (addressed this session to check 1x/day).     How long can you sit comfortably?  no issues    How long can you stand comfortably?  10 minutes    How long can you walk comfortably?  household distances    Patient Stated Goals  Pt would like to be able to walk farther, no falls, up and down steps    Currently in Pain?  No/denies    Pain Onset  1 to 4 weeks ago        TREATMENT   Bridges x15 reps   SLR with 3# ankle weights x10 reps bilaterally   Alternating marching, hooklying, with 3# ankle weights x15 reps bilaterally   LAQs x15 reps each LE x3 sec holds with 3# ankle weights   Squats with BUE support x15   Ambulating in gym with cues for foot clearance as pt shufles feet Bil   Alternating toe taps up to 6" step x15 each LE with intermittent UE support   Forward and backward stepping over hurdle x10 each direction   Rhomberg stance on airex x1 minute   Step ups forward and back to airex x10   Tandem walking in // bars with intermittent UE support x4 lengths                         PT Education - 12/28/17 1514    Education Details  Exercise technique    Person(s) Educated  Patient    Methods  Explanation;Demonstration;Verbal cues    Comprehension  Verbalized understanding;Returned demonstration;Verbal cues required;Need further instruction       PT Short Term Goals - 12/14/17 1540      PT SHORT TERM GOAL #1   Title  Pt will independently complete HEP at least 4 days/wk for improved carryover between sessions    Baseline  reports compliance with HEP     Time  2    Period  Weeks    Status  Achieved      PT SHORT TERM GOAL #2   Title  Pt will inspect her feet at least 1x/day for proper foot care due to peripheral neuropathy    Baseline  increasing frequency     Time  2    Period  Weeks    Status  Achieved        PT Long Term Goals - 12/14/17 1540      PT LONG TERM GOAL #1   Title  Pt's ABC scale will  improve to at least 50% to demonstrate decreased perception of imbalance    Baseline  20%; 8/15: 45%  Time  4    Period  Weeks    Status  Partially Met    Target Date  01/11/18      PT LONG TERM GOAL #2   Title  Pt's TUG time (with SPC) will decrease to less than or equal to 20 seconds to demonstrate improved mobility    Baseline  32 seconds; 8/15: 31 seconds    Time  4    Period  Weeks    Status  Partially Met    Target Date  01/11/18      PT LONG TERM GOAL #3   Title  Pt's Berg Balance Test will improve to at least 42/56 to demonstrate decreased risk of falling    Baseline  23/56; 8/15: 33/56     Time  4    Period  Weeks    Status  Partially Met    Target Date  01/11/18      PT LONG TERM GOAL #4   Title  Pt's 102mT (with SPC) will improve to at least 0.6 m/s to demonstrate improved gait speed in the home in community    Baseline  0.26 m/s; 8/15: .42 m/s with AD     Time  4    Period  Weeks    Status  Partially Met    Target Date  01/11/18            Plan - 12/28/17 1523    Clinical Impression Statement  Pt demonstrates fatigue and reports challenge with ankle weight exercises at start of session.  Continued strengthening and added in balance exercises in standing as pt denied calf pain this session. Pt continues to shuffle feet when ambulating saying, "it makes my balance better".  Explained her increased fall risk if her feet shuffles into an obstacle, pt verbalized understanding.  Pt will benefit from continued skilled PT interventions for improved balance and strength.      Rehab Potential  Good    PT Frequency  2x / week    PT Duration  4 weeks    PT Treatment/Interventions  ADLs/Self Care Home Management;Aquatic Therapy;Cryotherapy;Electrical Stimulation;Iontophoresis 459mml Dexamethasone;Moist Heat;Traction;Ultrasound;DME Instruction;Gait training;Stair training;Functional mobility training;Therapeutic activities;Therapeutic exercise;Balance training;Neuromuscular  re-education;Patient/family education;Orthotic Fit/Training;Wheelchair mobility training;Manual techniques;Compression bandaging;Passive range of motion;Dry needling;Energy conservation;Taping    PT Next Visit Plan  Progress HEP and continue with strength and balance training, gait training    PT Home Exercise Plan  seated marching, LAQ    Consulted and Agree with Plan of Care  Patient       Patient will benefit from skilled therapeutic intervention in order to improve the following deficits and impairments:  Abnormal gait, Decreased activity tolerance, Decreased balance, Decreased endurance, Decreased knowledge of use of DME, Decreased mobility, Decreased range of motion, Decreased safety awareness, Decreased strength, Difficulty walking, Hypomobility, Increased fascial restricitons, Impaired perceived functional ability, Impaired flexibility, Increased muscle spasms, Impaired sensation, Improper body mechanics, Postural dysfunction, Pain  Visit Diagnosis: Unsteadiness on feet  Muscle weakness (generalized)  Other abnormalities of gait and mobility     Problem List Patient Active Problem List   Diagnosis Date Noted  . Malnutrition of moderate degree 03/31/2017  . Acute respiratory failure with hypoxia (HCS[place]12/25/2018  . AKI (acute kidney injury) (HCSpart[place]04/02/2017  . Protein-calorie malnutrition, severe 02/08/2016  . Primary cancer of right upper lobe of lung ([facility]CVent[place]08/16/2017  . Abnormal CT lung screening 10/17/2015  . Personal history of tobacco use, presenting hazards to health 10/15/2015  . Chronic vulvitis  09/26/2014  . Allergic reaction 09/26/2014  . Carotid stenosis 08/30/2014  . Cervical nerve root disorder 08/10/2014  . CAD in native artery 08/10/2014  . B12 deficiency 08/10/2014  . Back ache 08/10/2014  . Bronchitis, chronic ([place]) 08/10/2014  . Diabetes mellitus with polyneuropathy ([place]) 08/10/2014  . Can't get food down 08/10/2014  . Eczema of external ear 08/10/2014   . Accumulation of fluid in tissues 08/10/2014  . Gout 08/10/2014  . Adult hypothyroidism 08/10/2014  . Mononeuritis 08/10/2014  . Muscle ache 08/10/2014  . Disorder of peripheral nervous system 08/10/2014  . Lesion of vulva 08/10/2014  . Cervical spondylosis with radiculopathy 10/19/2013  . COPD exacerbation ([place]) 03/29/2013  . CAD (coronary artery disease) 06/22/2011  . COPD (chronic obstructive pulmonary disease) with emphysema ([place]) 03/07/2010  . CHEST PAIN UNSPECIFIED 07/22/2009  . HLD (hyperlipidemia) 04/01/2009  . Malaise and fatigue 04/01/2009  . Hyperlipidemia 01/18/2009  . TOBACCO ABUSE 01/18/2009  . HYPERTENSION, BENIGN 01/18/2009  . CLAUDICATION 01/18/2009  . Pain in limb 01/18/2009  . CAFL (chronic airflow limitation) ([place]) 01/20/2007  . Late effects of cerebrovascular disease 01/10/2007  . Essential (primary) hypertension 12/22/2006    Collie Siad PT, DPT 12/28/2017, 4:18 PM  [place] MAIN [facility] SERVICES [address] [place], [place], [address] Phone: [phone]   Fax:  [phone]  Name: [name] [name] MRN: [id number] Date of Birth: 1940/06/08

## 2017-12-30 ENCOUNTER — Ambulatory Visit: Payer: Medicare Other | Admitting: Physical Therapy

## 2017-12-30 ENCOUNTER — Encounter: Payer: Self-pay | Admitting: Physical Therapy

## 2017-12-30 DIAGNOSIS — M6281 Muscle weakness (generalized): Secondary | ICD-10-CM

## 2017-12-30 DIAGNOSIS — R2681 Unsteadiness on feet: Secondary | ICD-10-CM

## 2017-12-30 DIAGNOSIS — R2689 Other abnormalities of gait and mobility: Secondary | ICD-10-CM | POA: Diagnosis not present

## 2017-12-30 NOTE — Therapy (Signed)
[place] MAIN [facility] SERVICES [address] [place], [place], [address] Phone: [phone]   Fax:  [phone]  Physical Therapy Treatment  Physical Therapy Progress Note   Dates of reporting period  11/18/17   to   12/30/17   Patient Details  Name: [name] [name] MRN: [id number] Date of Birth: 01-26-1941 Referring Provider (PT): Jerrol Banana, MD   Encounter Date: 12/30/2017  PT End of Session - 12/30/17 1520    Visit Number  20    Number of Visits  33    Date for PT Re-Evaluation  01/27/18    Authorization Type  10/10 progress note; next visit 1/10 progress note    Authorization Time Period   (last goals 12/14/17); next visit start of reporting period 9/26    PT Start Time  1519    PT Stop Time  1600    PT Time Calculation (min)  41 min    Equipment Utilized During Treatment  Gait belt    Activity Tolerance  Patient tolerated treatment well    Behavior During Therapy  WFL for tasks assessed/performed       Past Medical History:  Diagnosis Date  . Abnormal CT lung screening 10/17/2015  . COPD (chronic obstructive pulmonary disease) ([place])   . Coronary artery disease, non-occlusive    a. cath 2006: min nonobs CAD; b. cath 12/2010: cath LAD 50%, RCA 60%; c. 08/2013: Minimal luminal irregs, right dominant system with no significant CAD, diffuse luminal irregs noted. Normal EF 55%, no AS or MS.   . Diabetes mellitus   . Hyperlipemia    Followed by Dr. Rosanna Randy  . Hypertension   . Lung cancer ([place])   . Macular degeneration    rt  . Personal history of tobacco use, presenting hazards to health 10/15/2015  . Pneumonia    hx  . Shortness of breath   . Stroke [facility])     Past Surgical History:  Procedure Laterality Date  . ABDOMINAL HYSTERECTOMY    . ANTERIOR CERVICAL DECOMP/DISCECTOMY FUSION N/A 10/19/2013   Procedure: CERVICAL FIVE-SIX ANTERIOR CERVICAL DECOMPRESSION WITH FUSION INTERBODY PROSTHESIS PLATING AND PEEK CAGE;  Surgeon:  Ophelia Charter, MD;  Location: [place] NEURO ORS;  Service: Neurosurgery;  Laterality: N/A;  . BACK SURGERY  80's  . BREAST CYST EXCISION Left    left negative   . CARDIAC CATHETERIZATION  05/2004  . CATARACT EXTRACTION Left   . CHOLECYSTECTOMY    . ENDARTERECTOMY Left 10/24/2014   Procedure: ENDARTERECTOMY CAROTID;  Surgeon: Algernon Huxley, MD;  Location: [facility] ORS;  Service: Vascular;  Laterality: Left;  . ENDOBRONCHIAL ULTRASOUND N/A 11/14/2015   Procedure: ENDOBRONCHIAL ULTRASOUND;  Surgeon: [name], MD;  Location: [facility] ORS;  Service: Cardiopulmonary;  Laterality: N/A;  . PERIPHERAL VASCULAR CATHETERIZATION N/A 12/04/2015   Procedure: Glori Luis Cath Insertion;  Surgeon: Algernon Huxley, MD;  Location: [place] CV LAB;  Service: Cardiovascular;  Laterality: N/A;  . PORTA CATH REMOVAL N/A 05/17/2017   Procedure: PORTA CATH REMOVAL;  Surgeon: Algernon Huxley, MD;  Location: [place] CV LAB;  Service: Cardiovascular;  Laterality: N/A;  . TONSILLECTOMY AND ADENOIDECTOMY    . VESICOVAGINAL FISTULA CLOSURE W/ TAH      There were no vitals filed for this visit.  Subjective Assessment - 12/30/17 1523    Subjective  Pt is doing well this date. She reports she had some cramping in BLEs yesterdy after being busy cleaning her home. Pt  reports that since starting therapy she has improved by 70%.  Pt says that walking up the steps has gotten easier.  Pt says she is able to sweep now without fear of falling.      Pertinent History  Pt presents with reports of imbalance and BLE weakness. Pt reports that she had HHPT ~1 year ago and since has become more weak.  Pt reports 6 falls in the past 6 months.  Pt reports she loses her balance due to her RLE giving way, causing her to fall.  Pt with h/o stroke but denies it affecting one side more than the other.  Pt with h/o lung cancer with last treatment ~2 years ago.  Pt reports 8/10 LBP which she attributes to falls in the past.  Pt is able to ascend/descend steps but  this is challenging.   Pt has a ramp but she still goes up the steps.  Husband does the driving, grocery shopping.  Pt helps with cooking and cleaning.  Pt independent with showering and dressing.  Pt ambulates with her SPC at all times.  Pt checks her BP, pulse, O2, blood glucose each day.  She also checks the bottom of her feet 1x/wk due to her peripheral neuropathy (addressed this session to check 1x/day).     How long can you sit comfortably?  no issues    How long can you stand comfortably?  10 minutes    How long can you walk comfortably?  household distances    Patient Stated Goals  Pt would like to be able to walk farther, no falls, up and down steps    Currently in Pain?  No/denies    Pain Onset  1 to 4 weeks ago         [facility] PT Assessment - 12/30/17 1532      Balance   Balance Assessed  Yes      Standardized Balance Assessment   Standardized Balance Assessment  Berg Balance Test      Berg Balance Test   Sit to Stand  Able to stand  independently using hands    Standing Unsupported  Able to stand 2 minutes with supervision    Sitting with Back Unsupported but Feet Supported on Floor or Stool  Able to sit safely and securely 2 minutes    Stand to Sit  Sits safely with minimal use of hands    Transfers  Able to transfer safely, definite need of hands    Standing Unsupported with Eyes Closed  Able to stand 10 seconds with supervision    Standing Ubsupported with Feet Together  Able to place feet together independently and stand for 1 minute with supervision    From Standing, Reach Forward with Outstretched Arm  Can reach confidently >25 cm (10")    From Standing Position, Pick up Object from Floor  Able to pick up shoe, needs supervision    From Standing Position, Turn to Look Behind Over each Shoulder  Turn sideways only but maintains balance    Turn 360 Degrees  Able to turn 360 degrees safely one side only in 4 seconds or less    Standing Unsupported, Alternately Place Feet  on Step/Stool  Able to complete 4 steps without aid or supervision    Standing Unsupported, One Foot in Front  Needs help to step but can hold 15 seconds    Standing on One Leg  Able to lift leg independently and hold equal to or more than  3 seconds    Total Score  40       TREATMENT   11mT: 0.54 m/s with SPC   Berg Balance Test: 40/56   TUG: 18.09 sec with SPC   Seated rest break between outcome measures  ABC Scale: 57.5%   Alternating taps to 6" step with intermittent UE support x15 each LE   Sit<>stand from elevated mat table without UE support x10   LAQs x15 reps each LE x5 sec holds with 3# ankle weights              Patient's condition has the potential to improve in response to therapy. Maximum improvement is yet to be obtained. The anticipated improvement is attainable and reasonable in a generally predictable time.          PT Education - 12/30/17 1520    Education Details  Exercise technique; progress with goals    Person(s) Educated  Patient    Methods  Explanation;Demonstration;Verbal cues    Comprehension  Verbalized understanding;Returned demonstration;Verbal cues required;Need further instruction       PT Short Term Goals - 12/14/17 1540      PT SHORT TERM GOAL #1   Title  Pt will independently complete HEP at least 4 days/wk for improved carryover between sessions    Baseline  reports compliance with HEP     Time  2    Period  Weeks    Status  Achieved      PT SHORT TERM GOAL #2   Title  Pt will inspect her feet at least 1x/day for proper foot care due to peripheral neuropathy    Baseline  increasing frequency     Time  2    Period  Weeks    Status  Achieved        PT Long Term Goals - 12/30/17 1523      PT LONG TERM GOAL #1   Title  Pt's ABC scale will improve to at least 50% to demonstrate decreased perception of imbalance    Baseline  20%; 8/15: 45%; 9/26: 57.5%    Time  4    Period  Weeks    Status  Achieved       PT LONG TERM GOAL #2   Title  Pt's TUG time (with SPC) will decrease to less than or equal to 20 seconds to demonstrate improved mobility    Baseline  32 seconds; 8/15: 31 seconds; 9/26: 18.09 sec    Time  4    Period  Weeks    Status  Achieved      PT LONG TERM GOAL #3   Title  Pt's Berg Balance Test will improve to at least 42/56 to demonstrate decreased risk of falling    Baseline  23/56; 8/15: 33/56; 9/26: 40/56    Time  4    Period  Weeks    Status  Partially Met      PT LONG TERM GOAL #4   Title  Pt's 17m (with SPC) will improve to at least 0.6 m/s to demonstrate improved gait speed in the home in community    Baseline  0.26 m/s; 8/15: .42 m/s with AD; 9/26: 0.54 m/s    Time  4    Period  Weeks    Status  Partially Met      PT LONG TERM GOAL #5   Title  Pt will demonstrate ability to perform sit<>stand x1 without using UE support for improved QOL  Baseline  Pt requires at least 1UE support    Time  4    Period  Weeks    Status  New            Plan - 12/30/17 1619    Clinical Impression Statement  Pt has met her goal for her TUG with a significantly improved time this session.  Pt's 54mT has improved, over doubling, since starting therapy; however pt has not quite reached her goal of 0.6 m/s.  Pt's Berg Balance Score improved since last assessment, indicating pt's balance has improved.  Pt has met her goal for her ABC scale score, indicating a perceived improvement in her balance with activities. Pt reports that she would like to be able to stand without using her hands so this goal was added today.  Practiced sit<>stand from elevated surface this session.  Given pt's improvement, pt will benefit from continued skilled PT interventions for improved balance, strength, and independence.      Rehab Potential  Good    PT Frequency  2x / week    PT Duration  4 weeks    PT Treatment/Interventions  ADLs/Self Care Home Management;Aquatic Therapy;Cryotherapy;Electrical  Stimulation;Iontophoresis 423mml Dexamethasone;Moist Heat;Traction;Ultrasound;DME Instruction;Gait training;Stair training;Functional mobility training;Therapeutic activities;Therapeutic exercise;Balance training;Neuromuscular re-education;Patient/family education;Orthotic Fit/Training;Wheelchair mobility training;Manual techniques;Compression bandaging;Passive range of motion;Dry needling;Energy conservation;Taping    PT Next Visit Plan  Progress HEP and continue with strength and balance training, gait training    PT Home Exercise Plan  seated marching, LAQ    Consulted and Agree with Plan of Care  Patient       Patient will benefit from skilled therapeutic intervention in order to improve the following deficits and impairments:  Abnormal gait, Decreased activity tolerance, Decreased balance, Decreased endurance, Decreased knowledge of use of DME, Decreased mobility, Decreased range of motion, Decreased safety awareness, Decreased strength, Difficulty walking, Hypomobility, Increased fascial restricitons, Impaired perceived functional ability, Impaired flexibility, Increased muscle spasms, Impaired sensation, Improper body mechanics, Postural dysfunction, Pain  Visit Diagnosis: Unsteadiness on feet  Muscle weakness (generalized)  Other abnormalities of gait and mobility     Problem List Patient Active Problem List   Diagnosis Date Noted  . Malnutrition of moderate degree 03/31/2017  . Acute respiratory failure with hypoxia (HCJackson12/25/2018  . AKI (acute kidney injury) (HCFunston04/02/2017  . Protein-calorie malnutrition, severe 02/08/2016  . Primary cancer of right upper lobe of lung (HCValle Vista08/16/2017  . Abnormal CT lung screening 10/17/2015  . Personal history of tobacco use, presenting hazards to health 10/15/2015  . Chronic vulvitis 09/26/2014  . Allergic reaction 09/26/2014  . Carotid stenosis 08/30/2014  . Cervical nerve root disorder 08/10/2014  . CAD in native artery 08/10/2014   . B12 deficiency 08/10/2014  . Back ache 08/10/2014  . Bronchitis, chronic (HCSeverna Park05/09/2014  . Diabetes mellitus with polyneuropathy (HCHollowayville05/09/2014  . Can't get food down 08/10/2014  . Eczema of external ear 08/10/2014  . Accumulation of fluid in tissues 08/10/2014  . Gout 08/10/2014  . Adult hypothyroidism 08/10/2014  . Mononeuritis 08/10/2014  . Muscle ache 08/10/2014  . Disorder of peripheral nervous system 08/10/2014  . Lesion of vulva 08/10/2014  . Cervical spondylosis with radiculopathy 10/19/2013  . COPD exacerbation (HCPetrey12/24/2014  . CAD (coronary artery disease) 06/22/2011  . COPD (chronic obstructive pulmonary disease) with emphysema (HCHarpster12/05/2009  . CHEST PAIN UNSPECIFIED 07/22/2009  . HLD (hyperlipidemia) 04/01/2009  . Malaise and fatigue 04/01/2009  . Hyperlipidemia 01/18/2009  . TOBACCO ABUSE 01/18/2009  .  HYPERTENSION, BENIGN 01/18/2009  . CLAUDICATION 01/18/2009  . Pain in limb 01/18/2009  . CAFL (chronic airflow limitation) ([place]) 01/20/2007  . Late effects of cerebrovascular disease 01/10/2007  . Essential (primary) hypertension 12/22/2006    Collie Siad PT, DPT 12/30/2017, 4:21 PM  [place] MAIN [facility] SERVICES [address] [place], [place], [address] Phone: [phone]   Fax:  [phone]  Name: [name] [name] MRN: [id number] Date of Birth: [birth year]

## 2018-01-03 ENCOUNTER — Encounter: Payer: Self-pay | Admitting: Family Medicine

## 2018-01-03 DIAGNOSIS — E1129 Type 2 diabetes mellitus with other diabetic kidney complication: Secondary | ICD-10-CM | POA: Diagnosis not present

## 2018-01-03 DIAGNOSIS — N183 Chronic kidney disease, stage 3 (moderate): Secondary | ICD-10-CM | POA: Diagnosis not present

## 2018-01-04 ENCOUNTER — Ambulatory Visit: Payer: Medicare Other | Admitting: Physical Therapy

## 2018-01-04 ENCOUNTER — Encounter: Payer: Self-pay | Admitting: Physical Therapy

## 2018-01-04 ENCOUNTER — Ambulatory Visit: Payer: Medicare Other | Attending: Family Medicine | Admitting: Physical Therapy

## 2018-01-04 DIAGNOSIS — M6281 Muscle weakness (generalized): Secondary | ICD-10-CM | POA: Diagnosis not present

## 2018-01-04 DIAGNOSIS — R2681 Unsteadiness on feet: Secondary | ICD-10-CM | POA: Insufficient documentation

## 2018-01-04 DIAGNOSIS — R2689 Other abnormalities of gait and mobility: Secondary | ICD-10-CM | POA: Insufficient documentation

## 2018-01-04 NOTE — Therapy (Signed)
[place] MAIN [facility] SERVICES [address] [place], [place], [address] Phone: [phone]   Fax:  [phone]  Physical Therapy Treatment  Patient Details  Name: [name] [name] MRN: [id number] Date of Birth: 05-Mar-1941 Referring Provider (PT): Jerrol Banana, MD   Encounter Date: 01/04/2018  PT End of Session - 01/04/18 1430    Visit Number  21    Number of Visits  33    Date for PT Re-Evaluation  01/27/18    Authorization Type  1/10 progress note; next visit 1/10 progress note    Authorization Time Period   (last goals 12/14/17); next visit start of reporting period 9/26    PT Start Time  0230    PT Stop Time  0315    PT Time Calculation (min)  45 min    Equipment Utilized During Treatment  Gait belt    Activity Tolerance  Patient tolerated treatment well    Behavior During Therapy  WFL for tasks assessed/performed       Past Medical History:  Diagnosis Date  . Abnormal CT lung screening 10/17/2015  . COPD (chronic obstructive pulmonary disease) ([place])   . Coronary artery disease, non-occlusive    a. cath 2006: min nonobs CAD; b. cath 12/2010: cath LAD 50%, RCA 60%; c. 08/2013: Minimal luminal irregs, right dominant system with no significant CAD, diffuse luminal irregs noted. Normal EF 55%, no AS or MS.   . Diabetes mellitus   . Hyperlipemia    Followed by Dr. Rosanna Randy  . Hypertension   . Lung cancer ([place])   . Macular degeneration    rt  . Personal history of tobacco use, presenting hazards to health 10/15/2015  . Pneumonia    hx  . Shortness of breath   . Stroke [facility])     Past Surgical History:  Procedure Laterality Date  . ABDOMINAL HYSTERECTOMY    . ANTERIOR CERVICAL DECOMP/DISCECTOMY FUSION N/A 10/19/2013   Procedure: CERVICAL FIVE-SIX ANTERIOR CERVICAL DECOMPRESSION WITH FUSION INTERBODY PROSTHESIS PLATING AND PEEK CAGE;  Surgeon: Ophelia Charter, MD;  Location: [place] NEURO ORS;  Service: Neurosurgery;  Laterality: N/A;   . BACK SURGERY  80's  . BREAST CYST EXCISION Left    left negative   . CARDIAC CATHETERIZATION  05/2004  . CATARACT EXTRACTION Left   . CHOLECYSTECTOMY    . ENDARTERECTOMY Left 10/24/2014   Procedure: ENDARTERECTOMY CAROTID;  Surgeon: Algernon Huxley, MD;  Location: [facility] ORS;  Service: Vascular;  Laterality: Left;  . ENDOBRONCHIAL ULTRASOUND N/A 11/14/2015   Procedure: ENDOBRONCHIAL ULTRASOUND;  Surgeon: [name], MD;  Location: [facility] ORS;  Service: Cardiopulmonary;  Laterality: N/A;  . PERIPHERAL VASCULAR CATHETERIZATION N/A 12/04/2015   Procedure: Glori Luis Cath Insertion;  Surgeon: Algernon Huxley, MD;  Location: [place] CV LAB;  Service: Cardiovascular;  Laterality: N/A;  . PORTA CATH REMOVAL N/A 05/17/2017   Procedure: PORTA CATH REMOVAL;  Surgeon: Algernon Huxley, MD;  Location: [place] CV LAB;  Service: Cardiovascular;  Laterality: N/A;  . TONSILLECTOMY AND ADENOIDECTOMY    . VESICOVAGINAL FISTULA CLOSURE W/ TAH      There were no vitals filed for this visit.  Subjective Assessment - 01/04/18 1438    Subjective  Pt is doing well this date..No new compalints    Pertinent History  Pt presents with reports of imbalance and BLE weakness. Pt reports that she had HHPT ~1 year ago and since has become more weak.  Pt reports 6  falls in the past 6 months.  Pt reports she loses her balance due to her RLE giving way, causing her to fall.  Pt with h/o stroke but denies it affecting one side more than the other.  Pt with h/o lung cancer with last treatment ~2 years ago.  Pt reports 8/10 LBP which she attributes to falls in the past.  Pt is able to ascend/descend steps but this is challenging.   Pt has a ramp but she still goes up the steps.  Husband does the driving, grocery shopping.  Pt helps with cooking and cleaning.  Pt independent with showering and dressing.  Pt ambulates with her SPC at all times.  Pt checks her BP, pulse, O2, blood glucose each day.  She also checks the bottom of her feet 1x/wk  due to her peripheral neuropathy (addressed this session to check 1x/day).     How long can you sit comfortably?  no issues    How long can you stand comfortably?  10 minutes    How long can you walk comfortably?  household distances    Patient Stated Goals  Pt would like to be able to walk farther, no falls, up and down steps    Currently in Pain?  No/denies    Pain Score  0-No pain    Pain Onset  1 to 4 weeks ago    Pain Onset  Yesterday       TREATMENT   Standing hip extension x 15 BLE, with BUE support  Standing hip abd x 15 BLE with BUE support  Standing heel raises x 15   hooklying marching x 20   Hooklying Abd/ER x 20 with gTB  Bridges x15 reps   SLR with 3# ankle weights x10 reps bilaterally   Alternating marching, hooklying, with 3# ankle weights x15 reps bilaterally   LAQs x15 reps each LE x3 sec holds with 3# ankle weights   Squats with BUE support x15   Ambulating in gym with cues for foot clearance as pt shufles feet Bil   Alternating toe taps up to 6" step x15 each LE with intermittent UE support   Forward and backward stepping over hurdle x10 each direction   Rhomberg stance on airex x1 minute   Step ups forward and back to airex x10   Tandem walking in // bars with intermittent UE support x4 lengths    Cues for correct posture and technique                     PT Education - 01/04/18 1430    Education Details  Hep , safety,     Person(s) Educated  Patient    Methods  Explanation    Comprehension  Verbalized understanding;Returned demonstration;Need further instruction       PT Short Term Goals - 12/14/17 1540      PT SHORT TERM GOAL #1   Title  Pt will independently complete HEP at least 4 days/wk for improved carryover between sessions    Baseline  reports compliance with HEP     Time  2    Period  Weeks    Status  Achieved      PT SHORT TERM GOAL #2   Title  Pt will inspect her feet at least 1x/day for  proper foot care due to peripheral neuropathy    Baseline  increasing frequency     Time  2    Period  Weeks  Status  Achieved        PT Long Term Goals - 12/30/17 1523      PT LONG TERM GOAL #1   Title  Pt's ABC scale will improve to at least 50% to demonstrate decreased perception of imbalance    Baseline  20%; 8/15: 45%; 9/26: 57.5%    Time  4    Period  Weeks    Status  Achieved      PT LONG TERM GOAL #2   Title  Pt's TUG time (with SPC) will decrease to less than or equal to 20 seconds to demonstrate improved mobility    Baseline  32 seconds; 8/15: 31 seconds; 9/26: 18.09 sec    Time  4    Period  Weeks    Status  Achieved      PT LONG TERM GOAL #3   Title  Pt's Berg Balance Test will improve to at least 42/56 to demonstrate decreased risk of falling    Baseline  23/56; 8/15: 33/56; 9/26: 40/56    Time  4    Period  Weeks    Status  Partially Met      PT LONG TERM GOAL #4   Title  Pt's 58mT (with SPC) will improve to at least 0.6 m/s to demonstrate improved gait speed in the home in community    Baseline  0.26 m/s; 8/15: .42 m/s with AD; 9/26: 0.54 m/s    Time  4    Period  Weeks    Status  Partially Met      PT LONG TERM GOAL #5   Title  Pt will demonstrate ability to perform sit<>stand x1 without using UE support for improved QOL    Baseline  Pt requires at least 1UE support    Time  4    Period  Weeks    Status  New              Patient will benefit from skilled therapeutic intervention in order to improve the following deficits and impairments:     Visit Diagnosis: No diagnosis found.     Problem List Patient Active Problem List   Diagnosis Date Noted  . Malnutrition of moderate degree 03/31/2017  . Acute respiratory failure with hypoxia ([place] 03/30/2017  . AKI (acute kidney injury) ([place] 07/15/2016  . Protein-calorie malnutrition, severe 02/08/2016  . Primary cancer of right upper lobe of lung ([place] 11/20/2015  . Abnormal CT lung  screening 10/17/2015  . Personal history of tobacco use, presenting hazards to health 10/15/2015  . Chronic vulvitis 09/26/2014  . Allergic reaction 09/26/2014  . Carotid stenosis 08/30/2014  . Cervical nerve root disorder 08/10/2014  . CAD in native artery 08/10/2014  . B12 deficiency 08/10/2014  . Back ache 08/10/2014  . Bronchitis, chronic ([place] 08/10/2014  . Diabetes mellitus with polyneuropathy ([place] 08/10/2014  . Can't get food down 08/10/2014  . Eczema of external ear 08/10/2014  . Accumulation of fluid in tissues 08/10/2014  . Gout 08/10/2014  . Adult hypothyroidism 08/10/2014  . Mononeuritis 08/10/2014  . Muscle ache 08/10/2014  . Disorder of peripheral nervous system 08/10/2014  . Lesion of vulva 08/10/2014  . Cervical spondylosis with radiculopathy 10/19/2013  . COPD exacerbation ([place] 03/29/2013  . CAD (coronary artery disease) 06/22/2011  . COPD (chronic obstructive pulmonary disease) with emphysema ([place] 03/07/2010  . CHEST PAIN UNSPECIFIED 07/22/2009  . HLD (hyperlipidemia) 04/01/2009  . Malaise and fatigue 04/01/2009  . Hyperlipidemia 01/18/2009  .  TOBACCO ABUSE 01/18/2009  . HYPERTENSION, BENIGN 01/18/2009  . CLAUDICATION 01/18/2009  . Pain in limb 01/18/2009  . CAFL (chronic airflow limitation) ([place]) 01/20/2007  . Late effects of cerebrovascular disease 01/10/2007  . Essential (primary) hypertension 12/22/2006    Alanson Puls, [place] DPT 01/04/2018, 2:40 PM  [place] MAIN [facility] SERVICES [address] [place], [place], [address] Phone: [phone]   Fax:  [phone]  Name: AZLYNN MITNICK MRN: [id number] Date of Birth: [birth year]

## 2018-01-06 ENCOUNTER — Ambulatory Visit: Payer: Medicare Other | Admitting: Physical Therapy

## 2018-01-06 DIAGNOSIS — H353221 Exudative age-related macular degeneration, left eye, with active choroidal neovascularization: Secondary | ICD-10-CM | POA: Diagnosis not present

## 2018-01-10 DIAGNOSIS — H353221 Exudative age-related macular degeneration, left eye, with active choroidal neovascularization: Secondary | ICD-10-CM | POA: Diagnosis not present

## 2018-01-10 DIAGNOSIS — H353212 Exudative age-related macular degeneration, right eye, with inactive choroidal neovascularization: Secondary | ICD-10-CM | POA: Diagnosis not present

## 2018-01-11 ENCOUNTER — Other Ambulatory Visit: Payer: Self-pay | Admitting: Family Medicine

## 2018-01-11 ENCOUNTER — Ambulatory Visit: Payer: Medicare Other

## 2018-01-11 DIAGNOSIS — M6281 Muscle weakness (generalized): Secondary | ICD-10-CM | POA: Diagnosis not present

## 2018-01-11 DIAGNOSIS — R2689 Other abnormalities of gait and mobility: Secondary | ICD-10-CM | POA: Diagnosis not present

## 2018-01-11 DIAGNOSIS — R2681 Unsteadiness on feet: Secondary | ICD-10-CM | POA: Diagnosis not present

## 2018-01-11 DIAGNOSIS — I10 Essential (primary) hypertension: Secondary | ICD-10-CM

## 2018-01-11 NOTE — Therapy (Signed)
[place] MAIN [facility] SERVICES [address] [place], [place], [address] Phone: [phone]   Fax:  [phone]  Physical Therapy Treatment  Patient Details  Name: [name] [name] MRN: [id number] Date of Birth: [birth year] Referring Provider (PT): Jerrol Banana, MD   Encounter Date: 01/11/2018  PT End of Session - 01/11/18 1453    Visit Number  22    Number of Visits  33    Date for PT Re-Evaluation  01/27/18    Authorization Type  2/10 progress note;    Authorization Time Period   (last goals 12/14/17); next visit start of reporting period 9/26    PT Start Time  1430    PT Stop Time  1515    PT Time Calculation (min)  45 min    Equipment Utilized During Treatment  Gait belt    Activity Tolerance  Patient tolerated treatment well    Behavior During Therapy  WFL for tasks assessed/performed       Past Medical History:  Diagnosis Date  . Abnormal CT lung screening 10/17/2015  . COPD (chronic obstructive pulmonary disease) ([place])   . Coronary artery disease, non-occlusive    a. cath 2006: min nonobs CAD; b. cath 12/2010: cath LAD 50%, RCA 60%; c. 08/2013: Minimal luminal irregs, right dominant system with no significant CAD, diffuse luminal irregs noted. Normal EF 55%, no AS or MS.   . Diabetes mellitus   . Hyperlipemia    Followed by Dr. Rosanna Randy  . Hypertension   . Lung cancer ([place])   . Macular degeneration    rt  . Personal history of tobacco use, presenting hazards to health 10/15/2015  . Pneumonia    hx  . Shortness of breath   . Stroke [facility])     Past Surgical History:  Procedure Laterality Date  . ABDOMINAL HYSTERECTOMY    . ANTERIOR CERVICAL DECOMP/DISCECTOMY FUSION N/A 10/19/2013   Procedure: CERVICAL FIVE-SIX ANTERIOR CERVICAL DECOMPRESSION WITH FUSION INTERBODY PROSTHESIS PLATING AND PEEK CAGE;  Surgeon: Ophelia Charter, MD;  Location: [place] NEURO ORS;  Service: Neurosurgery;  Laterality: N/A;  . BACK SURGERY  80's  .  BREAST CYST EXCISION Left    left negative   . CARDIAC CATHETERIZATION  05/2004  . CATARACT EXTRACTION Left   . CHOLECYSTECTOMY    . ENDARTERECTOMY Left 10/24/2014   Procedure: ENDARTERECTOMY CAROTID;  Surgeon: Algernon Huxley, MD;  Location: [facility] ORS;  Service: Vascular;  Laterality: Left;  . ENDOBRONCHIAL ULTRASOUND N/A 11/14/2015   Procedure: ENDOBRONCHIAL ULTRASOUND;  Surgeon: [name], MD;  Location: [facility] ORS;  Service: Cardiopulmonary;  Laterality: N/A;  . PERIPHERAL VASCULAR CATHETERIZATION N/A 12/04/2015   Procedure: Glori Luis Cath Insertion;  Surgeon: Algernon Huxley, MD;  Location: [place] CV LAB;  Service: Cardiovascular;  Laterality: N/A;  . PORTA CATH REMOVAL N/A 05/17/2017   Procedure: PORTA CATH REMOVAL;  Surgeon: Algernon Huxley, MD;  Location: [place] CV LAB;  Service: Cardiovascular;  Laterality: N/A;  . TONSILLECTOMY AND ADENOIDECTOMY    . VESICOVAGINAL FISTULA CLOSURE W/ TAH      There were no vitals filed for this visit.  Subjective Assessment - 01/11/18 1451    Subjective  Pt reports that she is doing alright today. She saw her opthamologist yesterday who put 2 injections into her L eye. She can't see much out of her left eye right now and her right eye is essentially blind so she has been struggling with her vision.  No specific questions at this time.     Pertinent History  Pt presents with reports of imbalance and BLE weakness. Pt reports that she had HHPT ~1 year ago and since has become more weak.  Pt reports 6 falls in the past 6 months.  Pt reports she loses her balance due to her RLE giving way, causing her to fall.  Pt with h/o stroke but denies it affecting one side more than the other.  Pt with h/o lung cancer with last treatment ~2 years ago.  Pt reports 8/10 LBP which she attributes to falls in the past.  Pt is able to ascend/descend steps but this is challenging.   Pt has a ramp but she still goes up the steps.  Husband does the driving, grocery shopping.  Pt helps  with cooking and cleaning.  Pt independent with showering and dressing.  Pt ambulates with her SPC at all times.  Pt checks her BP, pulse, O2, blood glucose each day.  She also checks the bottom of her feet 1x/wk due to her peripheral neuropathy (addressed this session to check 1x/day).     How long can you sit comfortably?  no issues    How long can you stand comfortably?  10 minutes    How long can you walk comfortably?  household distances    Patient Stated Goals  Pt would like to be able to walk farther, no falls, up and down steps    Currently in Pain?  Yes    Pain Score  8     Pain Location  Eye    Pain Orientation  Left    Pain Descriptors / Indicators  Aching;Burning    Pain Type  Acute pain    Pain Onset  In the past 7 days    Multiple Pain Sites  No    Pain Onset  --         TREATMENT   Ther-ex  NuStep L2 x 6 minutes for warm-up during history (4 minutes unbilled); Standing hip extension x 15 BLE, with BUE support Standing hip abd with 2# ankle weight (AW) x 10 BLE with BUE support; Standing hip extension with 2# ankle weight (AW) x 10 BLE with BUE support; Standing hip flexion marches with 2# ankle weight (AW) x 10 BLE with BUE support; Standing HS curls with 2# ankle weight (AW) x 10 BLE with BUE support; Standing heel raises x 10 Mini squats x 10 with bilateral UE support, cues for proper technique;  Neuromuscular Re-education  Toe taps to 6" step without UE support alternating LE x 10 each; Feet together balance with eyes open x 30s, closed x 30s; Halftandem balance alternating forward LE x 30s each; Full tandem balance alternating forward LE x 30s each, min to modA to assist intermittently with balance in addition to intermittent tapping on bars Forward and backward stepping over hurdle x10 each direction  Tandem gait in // bars x 4 lengths;   Pt educated throughout session about proper posture and technique with exercises. Improved exercise technique, movement  at target joints, use of target muscles after min to mod verbal, visual, tactile cues.    Instructed patient in balance and strengthening exercise. Patient requires CGA to min A with balance exercise. Patient requires increased cues today due to limited vision. Intermittent seated rest breaks throughout session. Patient would benefit from additional skilled PT intervention to improve balance/gait safety and reduce fall risk.  PT Short Term Goals - 12/14/17 1540      PT SHORT TERM GOAL #1   Title  Pt will independently complete HEP at least 4 days/wk for improved carryover between sessions    Baseline  reports compliance with HEP     Time  2    Period  Weeks    Status  Achieved      PT SHORT TERM GOAL #2   Title  Pt will inspect her feet at least 1x/day for proper foot care due to peripheral neuropathy    Baseline  increasing frequency     Time  2    Period  Weeks    Status  Achieved        PT Long Term Goals - 12/30/17 1523      PT LONG TERM GOAL #1   Title  Pt's ABC scale will improve to at least 50% to demonstrate decreased perception of imbalance    Baseline  20%; 8/15: 45%; 9/26: 57.5%    Time  4    Period  Weeks    Status  Achieved      PT LONG TERM GOAL #2   Title  Pt's TUG time (with SPC) will decrease to less than or equal to 20 seconds to demonstrate improved mobility    Baseline  32 seconds; 8/15: 31 seconds; 9/26: 18.09 sec    Time  4    Period  Weeks    Status  Achieved      PT LONG TERM GOAL #3   Title  Pt's Berg Balance Test will improve to at least 42/56 to demonstrate decreased risk of falling    Baseline  23/56; 8/15: 33/56; 9/26: 40/56    Time  4    Period  Weeks    Status  Partially Met      PT LONG TERM GOAL #4   Title  Pt's 59mT (with SPC) will improve to at least 0.6 m/s to demonstrate improved gait speed in the home in community    Baseline  0.26 m/s; 8/15: .42 m/s with AD; 9/26: 0.54 m/s    Time  4     Period  Weeks    Status  Partially Met      PT LONG TERM GOAL #5   Title  Pt will demonstrate ability to perform sit<>stand x1 without using UE support for improved QOL    Baseline  Pt requires at least 1UE support    Time  4    Period  Weeks    Status  New            Plan - 01/11/18 1527    Clinical Impression Statement  Instructed patient in balance and strengthening exercise. Patient requires CGA to min A with balance exercise. Patient requires increased cues today due to limited vision. Intermittent seated rest breaks throughout session. Patient would benefit from additional skilled PT intervention to improve balance/gait safety and reduce fall risk.    Rehab Potential  Good    PT Frequency  2x / week    PT Duration  4 weeks    PT Treatment/Interventions  ADLs/Self Care Home Management;Aquatic Therapy;Cryotherapy;Electrical Stimulation;Iontophoresis 418mml Dexamethasone;Moist Heat;Traction;Ultrasound;DME Instruction;Gait training;Stair training;Functional mobility training;Therapeutic activities;Therapeutic exercise;Balance training;Neuromuscular re-education;Patient/family education;Orthotic Fit/Training;Wheelchair mobility training;Manual techniques;Compression bandaging;Passive range of motion;Dry needling;Energy conservation;Taping    PT Next Visit Plan  Progress HEP and continue with strength and balance training, gait training    PT Home Exercise Plan  seated marching, LAQ  Consulted and Agree with Plan of Care  Patient       Patient will benefit from skilled therapeutic intervention in order to improve the following deficits and impairments:  Abnormal gait, Decreased activity tolerance, Decreased balance, Decreased endurance, Decreased knowledge of use of DME, Decreased mobility, Decreased range of motion, Decreased safety awareness, Decreased strength, Difficulty walking, Hypomobility, Increased fascial restricitons, Impaired perceived functional ability, Impaired  flexibility, Increased muscle spasms, Impaired sensation, Improper body mechanics, Postural dysfunction, Pain  Visit Diagnosis: Unsteadiness on feet  Muscle weakness (generalized)  Other abnormalities of gait and mobility     Problem List Patient Active Problem List   Diagnosis Date Noted  . Malnutrition of moderate degree 03/31/2017  . Acute respiratory failure with hypoxia ([place]) 03/30/2017  . AKI (acute kidney injury) ([place]) 07/15/2016  . Protein-calorie malnutrition, severe 02/08/2016  . Primary cancer of right upper lobe of lung ([place]) 11/20/2015  . Abnormal CT lung screening 10/17/2015  . Personal history of tobacco use, presenting hazards to health 10/15/2015  . Chronic vulvitis 09/26/2014  . Allergic reaction 09/26/2014  . Carotid stenosis 08/30/2014  . Cervical nerve root disorder 08/10/2014  . CAD in native artery 08/10/2014  . B12 deficiency 08/10/2014  . Back ache 08/10/2014  . Bronchitis, chronic ([place]) 08/10/2014  . Diabetes mellitus with polyneuropathy ([place]) 08/10/2014  . Can't get food down 08/10/2014  . Eczema of external ear 08/10/2014  . Accumulation of fluid in tissues 08/10/2014  . Gout 08/10/2014  . Adult hypothyroidism 08/10/2014  . Mononeuritis 08/10/2014  . Muscle ache 08/10/2014  . Disorder of peripheral nervous system 08/10/2014  . Lesion of vulva 08/10/2014  . Cervical spondylosis with radiculopathy 10/19/2013  . COPD exacerbation ([place]) 03/29/2013  . CAD (coronary artery disease) 06/22/2011  . COPD (chronic obstructive pulmonary disease) with emphysema ([place]) 03/07/2010  . CHEST PAIN UNSPECIFIED 07/22/2009  . HLD (hyperlipidemia) 04/01/2009  . Malaise and fatigue 04/01/2009  . Hyperlipidemia 01/18/2009  . TOBACCO ABUSE 01/18/2009  . HYPERTENSION, BENIGN 01/18/2009  . CLAUDICATION 01/18/2009  . Pain in limb 01/18/2009  . CAFL (chronic airflow limitation) ([place]) 01/20/2007  . Late effects of cerebrovascular disease 01/10/2007  . Essential  (primary) hypertension 12/22/2006   Phillips Grout PT, DPT, GCS  Huprich,Jason 01/11/2018, 3:34 PM  [place] MAIN [facility] SERVICES [address] [place], [place], [address] Phone: [phone]   Fax:  [phone]  Name: [name] [name] MRN: [id number] Date of Birth: May 20, 1940

## 2018-01-11 NOTE — Telephone Encounter (Signed)
Pharmacy requesting refills. Thanks!  

## 2018-01-13 ENCOUNTER — Ambulatory Visit (INDEPENDENT_AMBULATORY_CARE_PROVIDER_SITE_OTHER): Payer: Medicare Other | Admitting: Family Medicine

## 2018-01-13 ENCOUNTER — Ambulatory Visit: Payer: Medicare Other | Admitting: Physical Therapy

## 2018-01-13 ENCOUNTER — Encounter: Payer: Self-pay | Admitting: Family Medicine

## 2018-01-13 VITALS — BP 132/68 | HR 68 | Temp 97.7°F | Resp 16 | Wt 152.0 lb

## 2018-01-13 DIAGNOSIS — R2681 Unsteadiness on feet: Secondary | ICD-10-CM

## 2018-01-13 DIAGNOSIS — R2689 Other abnormalities of gait and mobility: Secondary | ICD-10-CM

## 2018-01-13 DIAGNOSIS — J441 Chronic obstructive pulmonary disease with (acute) exacerbation: Secondary | ICD-10-CM

## 2018-01-13 DIAGNOSIS — M6281 Muscle weakness (generalized): Secondary | ICD-10-CM

## 2018-01-13 DIAGNOSIS — C3411 Malignant neoplasm of upper lobe, right bronchus or lung: Secondary | ICD-10-CM | POA: Diagnosis not present

## 2018-01-13 MED ORDER — DOXYCYCLINE HYCLATE 100 MG PO TABS: 100.0000 mg | ORAL_TABLET | Freq: Two times a day (BID) | ORAL | 0 refills | Status: DC

## 2018-01-13 NOTE — Progress Notes (Signed)
Patient: [name] [name] Female    DOB: Oct 05, 1940   77 y.o.   MRN: [id number] Visit Date: 01/13/2018  Today's Provider: [name], MD   Chief Complaint  Patient presents with  . URI   Subjective:    URI   This is a new problem. The problem has been gradually worsening. There has been no fever. Associated symptoms include congestion, coughing, ear pain, rhinorrhea, a sore throat and wheezing. Pertinent negatives include no abdominal pain, headaches, plugged ear sensation, sinus pain or vomiting.       Allergies  Allergen Reactions  . Coconut Fatty Acids Swelling and Other (See Comments)    Throat swells     Current Outpatient Medications:  .  albuterol (PROVENTIL) (5 MG/ML) 0.5% nebulizer solution, Take 0.5 mLs (2.5 mg total) by nebulization every 6 (six) hours as needed for wheezing or shortness of breath. (Patient taking differently: Take 2.5 mg by nebulization daily. ), Disp: 100 vial, Rfl: 0 .  Blood Glucose Monitoring Suppl (ONE TOUCH ULTRA 2) w/Device KIT, 1 each by Does not apply route See admin instructions., Disp: 1 each, Rfl: 0 .  budesonide-formoterol (SYMBICORT) 160-4.5 MCG/ACT inhaler, Inhale 2 puffs into the lungs 2 (two) times daily., Disp: 3 Inhaler, Rfl: 3 .  doxycycline (VIBRA-TABS) 100 MG tablet, Take 1 tablet (100 mg total) by mouth 2 (two) times daily., Disp: 20 tablet, Rfl: 0 .  fluticasone (FLONASE) 50 MCG/ACT nasal spray, Place 2 sprays into both nostrils daily., Disp: 16 g, Rfl: 6 .  gabapentin (NEURONTIN) 600 MG tablet, TAKE ONE-HALF TABLET BY  MOUTH TWO TIMES DAILY, Disp: 90 tablet, Rfl: 3 .  glimepiride (AMARYL) 2 MG tablet, Take 1 tablet (2 mg total) by mouth 2 (two) times daily., Disp: 180 tablet, Rfl: 3 .  HYDROcodone-acetaminophen (NORCO) 5-325 MG tablet, Take 1 tablet by mouth every 6 (six) hours as needed for moderate pain or severe pain., Disp: 20 tablet, Rfl: 0 .  isosorbide mononitrate (IMDUR) 30 MG 24 hr tablet, Take 1 tablet  (30 mg total) by mouth daily., Disp: 90 tablet, Rfl: 3 .  LORazepam (ATIVAN) 1 MG tablet, Take 1 tablet (1 mg total) by mouth 2 (two) times daily as needed for anxiety. (Patient not taking: Reported on 10/21/2017), Disp: 30 tablet, Rfl: 5 .  magnesium oxide (MAG-OX) 400 (241.3 MG) MG tablet, Take 400 mg by mouth 2 (two) times daily. , Disp: , Rfl:  .  metoprolol tartrate (LOPRESSOR) 25 MG tablet, TAKE 1 TABLET BY MOUTH TWO  TIMES DAILY, Disp: 180 tablet, Rfl: 3 .  mometasone (ELOCON) 0.1 % cream, Apply small amount nightly to each ear, Disp: 45 g, Rfl: 0 .  Multiple Vitamins-Minerals (ICAPS) CAPS, Take 1 capsule by mouth 2 (two) times daily. , Disp: , Rfl:  .  nitroGLYCERIN (NITROSTAT) 0.4 MG SL tablet, Place 1 tablet (0.4 mg total) under the tongue every 5 (five) minutes as needed for chest pain., Disp: 25 tablet, Rfl: 3 .  ofloxacin (FLOXIN) 0.3 % OTIC solution, PLACE 10 DROPS INTO THE LEFT EAR DAILY. (Patient not taking: Reported on 09/21/2017), Disp: 5 mL, Rfl: 0 .  ondansetron (ZOFRAN) 8 MG tablet, Take 1 tablet (8 mg total) by mouth every 8 (eight) hours as needed for nausea or vomiting., Disp: 30 tablet, Rfl: 1 .  ONE TOUCH ULTRA TEST test strip, USE TO CHECK BLOOD SUGAR ONCE DAILY, Disp: 100 each, Rfl: 11 .  pantoprazole (PROTONIX) 40 MG tablet, TAKE  1 TABLET BY MOUTH  DAILY, Disp: 90 tablet, Rfl: 3 .  triamcinolone cream (KENALOG) 0.1 %, Apply 1 application topically 2 (two) times daily., Disp: 30 g, Rfl: 0 No current facility-administered medications for this visit.   Facility-Administered Medications Ordered in Other Visits:  .  ondansetron (ZOFRAN) 8 mg, dexamethasone (DECADRON) 10 mg in sodium chloride 0.9 % 50 mL IVPB, , Intravenous, Once, Finnegan, Kathlene November, MD  Review of Systems  Constitutional: Negative.   HENT: Positive for congestion, ear pain, postnasal drip, rhinorrhea and sore throat. Negative for ear discharge, nosebleeds, sinus pressure and sinus pain.   Eyes: Negative.     Respiratory: Positive for cough, chest tightness, shortness of breath and wheezing. Negative for apnea, choking and stridor.   Gastrointestinal: Negative.  Negative for abdominal pain and vomiting.  Endocrine: Negative.   Allergic/Immunologic: Negative.   Neurological: Negative for dizziness, light-headedness and headaches.  Psychiatric/Behavioral: Negative.     Social History   Tobacco Use  . Smoking status: Former Smoker    Packs/day: 1.00    Years: 50.00    Pack years: 50.00    Types: Cigarettes    Last attempt to quit: 10/03/2015    Years since quitting: 2.2  . Smokeless tobacco: Never Used  . Tobacco comment: smokes 3 cigs daily 05/06/15. Pt instructed to quit.  Substance Use Topics  . Alcohol use: No    Alcohol/week: 0.0 standard drinks   Objective:   BP 132/68 (BP Location: Left Arm, Patient Position: Sitting, Cuff Size: Normal)   Pulse 68   Temp 97.7 F (36.5 C) (Oral)   Resp 16   Wt 152 lb (68.9 kg)   BMI 26.09 kg/m  Vitals:   01/13/18 1543  BP: 132/68  Pulse: 68  Resp: 16  Temp: 97.7 F (36.5 C)  TempSrc: Oral  Weight: 152 lb (68.9 kg)     Physical Exam  Constitutional: She appears well-developed and well-nourished.  HENT:  Head: Normocephalic and atraumatic.  Eyes: No scleral icterus.  Neck: No thyromegaly present.  Cardiovascular: Normal rate, regular rhythm and normal heart sounds.  Pulmonary/Chest: Effort normal and breath sounds normal.  Musculoskeletal: She exhibits no edema.  Skin: Skin is warm and dry.  Psychiatric: She has a normal mood and affect. Her behavior is normal. Judgment and thought content normal.        Assessment & Plan:     1. COPD exacerbation (HCC) Doxycycline for 1 week.   2. Primary cancer of right upper lobe of lung [facility]) Doing well per oncology.    I have done the exam and reviewed the chart and it is accurate to the best of my knowledge. Development worker, community has been used and  any errors in dictation or  transcription are unintentional. Miguel Aschoff M.D. [facility]

## 2018-01-13 NOTE — Therapy (Signed)
[place] MAIN [facility] SERVICES [address] [place], [place], [address] Phone: [phone]   Fax:  [phone]  Physical Therapy Treatment  Patient Details  Name: [name] [name] MRN: [id number] Date of Birth: [birth year] Referring Provider (PT): Jerrol Banana, MD   Encounter Date: 01/13/2018  PT End of Session - 01/13/18 1440    Visit Number  23    Number of Visits  33    Date for PT Re-Evaluation  01/27/18    Authorization Type  2/10 progress note;    Authorization Time Period   (last goals 12/14/17); next visit start of reporting period 9/26    PT Start Time  1435    PT Stop Time  1515    PT Time Calculation (min)  40 min    Activity Tolerance  Patient tolerated treatment well;No increased pain    Behavior During Therapy  WFL for tasks assessed/performed       Past Medical History:  Diagnosis Date  . Abnormal CT lung screening 10/17/2015  . COPD (chronic obstructive pulmonary disease) ([place])   . Coronary artery disease, non-occlusive    a. cath 2006: min nonobs CAD; b. cath 12/2010: cath LAD 50%, RCA 60%; c. 08/2013: Minimal luminal irregs, right dominant system with no significant CAD, diffuse luminal irregs noted. Normal EF 55%, no AS or MS.   . Diabetes mellitus   . Hyperlipemia    Followed by Dr. Rosanna Randy  . Hypertension   . Lung cancer ([place])   . Macular degeneration    rt  . Personal history of tobacco use, presenting hazards to health 10/15/2015  . Pneumonia    hx  . Shortness of breath   . Stroke [facility])     Past Surgical History:  Procedure Laterality Date  . ABDOMINAL HYSTERECTOMY    . ANTERIOR CERVICAL DECOMP/DISCECTOMY FUSION N/A 10/19/2013   Procedure: CERVICAL FIVE-SIX ANTERIOR CERVICAL DECOMPRESSION WITH FUSION INTERBODY PROSTHESIS PLATING AND PEEK CAGE;  Surgeon: Ophelia Charter, MD;  Location: [place] NEURO ORS;  Service: Neurosurgery;  Laterality: N/A;  . BACK SURGERY  80's  . BREAST CYST EXCISION Left    left  negative   . CARDIAC CATHETERIZATION  05/2004  . CATARACT EXTRACTION Left   . CHOLECYSTECTOMY    . ENDARTERECTOMY Left 10/24/2014   Procedure: ENDARTERECTOMY CAROTID;  Surgeon: Algernon Huxley, MD;  Location: [facility] ORS;  Service: Vascular;  Laterality: Left;  . ENDOBRONCHIAL ULTRASOUND N/A 11/14/2015   Procedure: ENDOBRONCHIAL ULTRASOUND;  Surgeon: [name], MD;  Location: [facility] ORS;  Service: Cardiopulmonary;  Laterality: N/A;  . PERIPHERAL VASCULAR CATHETERIZATION N/A 12/04/2015   Procedure: Glori Luis Cath Insertion;  Surgeon: Algernon Huxley, MD;  Location: [place] CV LAB;  Service: Cardiovascular;  Laterality: N/A;  . PORTA CATH REMOVAL N/A 05/17/2017   Procedure: PORTA CATH REMOVAL;  Surgeon: Algernon Huxley, MD;  Location: [place] CV LAB;  Service: Cardiovascular;  Laterality: N/A;  . TONSILLECTOMY AND ADENOIDECTOMY    . VESICOVAGINAL FISTULA CLOSURE W/ TAH      There were no vitals filed for this visit.  Subjective Assessment - 01/13/18 1439    Subjective  Pt reports 8/10 eye pain todya, bu ti sheaded to the eye doctor after PT session this date. Pt reports HEP is gong well, no recen tchanges to meds.     Pertinent History  Pt presents with reports of imbalance and BLE weakness. Pt reports that she had HHPT ~1 year ago and  since has become more weak.  Pt reports 6 falls in the past 6 months.  Pt reports she loses her balance due to her RLE giving way, causing her to fall.  Pt with h/o stroke but denies it affecting one side more than the other.  Pt with h/o lung cancer with last treatment ~2 years ago.  Pt reports 8/10 LBP which she attributes to falls in the past.  Pt is able to ascend/descend steps but this is challenging.   Pt has a ramp but she still goes up the steps.  Husband does the driving, grocery shopping.  Pt helps with cooking and cleaning.  Pt independent with showering and dressing.  Pt ambulates with her SPC at all times.  Pt checks her BP, pulse, O2, blood glucose each day.  She  also checks the bottom of her feet 1x/wk due to her peripheral neuropathy (addressed this session to check 1x/day).     Currently in Pain?  Yes    Pain Score  8     Pain Location  --   Lt EYE   Pain Descriptors / Indicators  Aching;Burning       TREATMENT    Ther-ex  Standing hip abd with 2# ankle weight (AW) x 15 BLE c BUE support; Standing hip extension with 2# ankle weight (AW) x 10 BLE with BUE support; Standing hip flexion marches with 2# ankle weight (AW) x 10 BLE with BUE support; Standing HS curls with 2# ankle weight (AW) x 10 BLE with BUE support; Standing heel raises x 15 STS from chair +1 airex pads, basketball in hands for forward trunk flexion    Neuromuscular Re-education  Toe taps to 6" step without UE support alternating LE x 10 each; Feet together balance with basketball self toss/catch 3x60sec Normal stance on Airex pad 2x60sec (requires minA for falls recovery 3+ times)  Forward and backward stepping over foam roll x10 each direction (requires minA for falls recovery 3+ times)  SIde stepping over foam roll 10x each direction (requires minA for falls recovery 3+ times)   *intermitent seated rest breaks ad lib d/t limited tolerance to standing     PT Short Term Goals - 12/14/17 1540      PT SHORT TERM GOAL #1   Title  Pt will independently complete HEP at least 4 days/wk for improved carryover between sessions    Baseline  reports compliance with HEP     Time  2    Period  Weeks    Status  Achieved      PT SHORT TERM GOAL #2   Title  Pt will inspect her feet at least 1x/day for proper foot care due to peripheral neuropathy    Baseline  increasing frequency     Time  2    Period  Weeks    Status  Achieved        PT Long Term Goals - 12/30/17 1523      PT LONG TERM GOAL #1   Title  Pt's ABC scale will improve to at least 50% to demonstrate decreased perception of imbalance    Baseline  20%; 8/15: 45%; 9/26: 57.5%    Time  4    Period  Weeks     Status  Achieved      PT LONG TERM GOAL #2   Title  Pt's TUG time (with SPC) will decrease to less than or equal to 20 seconds to demonstrate improved mobility  Baseline  32 seconds; 8/15: 31 seconds; 9/26: 18.09 sec    Time  4    Period  Weeks    Status  Achieved      PT LONG TERM GOAL #3   Title  Pt's Berg Balance Test will improve to at least 42/56 to demonstrate decreased risk of falling    Baseline  23/56; 8/15: 33/56; 9/26: 40/56    Time  4    Period  Weeks    Status  Partially Met      PT LONG TERM GOAL #4   Title  Pt's 29mT (with SPC) will improve to at least 0.6 m/s to demonstrate improved gait speed in the home in community    Baseline  0.26 m/s; 8/15: .42 m/s with AD; 9/26: 0.54 m/s    Time  4    Period  Weeks    Status  Partially Met      PT LONG TERM GOAL #5   Title  Pt will demonstrate ability to perform sit<>stand x1 without using UE support for improved QOL    Baseline  Pt requires at least 1UE support    Time  4    Period  Weeks    Status  New            Plan - 01/13/18 1450    Clinical Impression Statement  Pt able to tolerate entire session as planned, movement consistently paced and pt c flat affect mostly, hence difficult to use body language to ascertain effort level with strengthening interventions. Minimal verbal cues for form with hip extension abduction, except for maintaining a stable fixed trunk, which she requires intermittent verbal and tactile cues for continued focus. Balance interventions are more difficulty. Pt mentions fatigue toward end of session which is evident in decline in balance performance.    Rehab Potential  Good    PT Frequency  2x / week    PT Duration  4 weeks    PT Treatment/Interventions  ADLs/Self Care Home Management;Aquatic Therapy;Cryotherapy;Electrical Stimulation;Iontophoresis 491mml Dexamethasone;Moist Heat;Traction;Ultrasound;DME Instruction;Gait training;Stair training;Functional mobility training;Therapeutic  activities;Therapeutic exercise;Balance training;Neuromuscular re-education;Patient/family education;Orthotic Fit/Training;Wheelchair mobility training;Manual techniques;Compression bandaging;Passive range of motion;Dry needling;Energy conservation;Taping    PT Next Visit Plan  Progress HEP and continue with strength and balance training, gait training    PT Home Exercise Plan  seated marching, LAQ    Consulted and Agree with Plan of Care  Patient       Patient will benefit from skilled therapeutic intervention in order to improve the following deficits and impairments:  Abnormal gait, Decreased activity tolerance, Decreased balance, Decreased endurance, Decreased knowledge of use of DME, Decreased mobility, Decreased range of motion, Decreased safety awareness, Decreased strength, Difficulty walking, Hypomobility, Increased fascial restricitons, Impaired perceived functional ability, Impaired flexibility, Increased muscle spasms, Impaired sensation, Improper body mechanics, Postural dysfunction, Pain  Visit Diagnosis: Unsteadiness on feet  Muscle weakness (generalized)  Other abnormalities of gait and mobility     Problem List Patient Active Problem List   Diagnosis Date Noted  . Malnutrition of moderate degree 03/31/2017  . Acute respiratory failure with hypoxia ([facility]/25/2018  . AKI (acute kidney injury) ([facility]/02/2017  . Protein-calorie malnutrition, severe 02/08/2016  . Primary cancer of right upper lobe of lung ([facility]/16/2017  . Abnormal CT lung screening 10/17/2015  . Personal history of tobacco use, presenting hazards to health 10/15/2015  . Chronic vulvitis 09/26/2014  . Allergic reaction 09/26/2014  . Carotid stenosis 08/30/2014  . Cervical nerve root  disorder 08/10/2014  . CAD in native artery 08/10/2014  . B12 deficiency 08/10/2014  . Back ache 08/10/2014  . Bronchitis, chronic ([place]) 08/10/2014  . Diabetes mellitus with polyneuropathy ([place]) 08/10/2014  . Can't  get food down 08/10/2014  . Eczema of external ear 08/10/2014  . Accumulation of fluid in tissues 08/10/2014  . Gout 08/10/2014  . Adult hypothyroidism 08/10/2014  . Mononeuritis 08/10/2014  . Muscle ache 08/10/2014  . Disorder of peripheral nervous system 08/10/2014  . Lesion of vulva 08/10/2014  . Cervical spondylosis with radiculopathy 10/19/2013  . COPD exacerbation ([employer]) 03/29/2013  . CAD (coronary artery disease) 06/22/2011  . COPD (chronic obstructive pulmonary disease) with emphysema ([place]) 03/07/2010  . CHEST PAIN UNSPECIFIED 07/22/2009  . HLD (hyperlipidemia) 04/01/2009  . Malaise and fatigue 04/01/2009  . Hyperlipidemia 01/18/2009  . TOBACCO ABUSE 01/18/2009  . HYPERTENSION, BENIGN 01/18/2009  . CLAUDICATION 01/18/2009  . Pain in limb 01/18/2009  . CAFL (chronic airflow limitation) ([place]) 01/20/2007  . Late effects of cerebrovascular disease 01/10/2007  . Essential (primary) hypertension 12/22/2006   3:16 PM, 01/13/18 Etta Grandchild, PT, DPT Physical Therapist - [facility]  Outpatient Physical Therapy- [place] [phone]    Etta Grandchild 01/13/2018, 2:58 PM  [place] MAIN [facility] SERVICES [address] [place], [place], [address] Phone: [phone]   Fax:  [phone]  Name: [name] [name] MRN: [id number] Date of Birth: [birth year]

## 2018-01-18 ENCOUNTER — Ambulatory Visit: Payer: Medicare Other | Admitting: Physical Therapy

## 2018-01-18 ENCOUNTER — Encounter: Payer: Self-pay | Admitting: Physical Therapy

## 2018-01-18 DIAGNOSIS — R2689 Other abnormalities of gait and mobility: Secondary | ICD-10-CM

## 2018-01-18 DIAGNOSIS — M6281 Muscle weakness (generalized): Secondary | ICD-10-CM

## 2018-01-18 DIAGNOSIS — R2681 Unsteadiness on feet: Secondary | ICD-10-CM | POA: Diagnosis not present

## 2018-01-18 NOTE — Therapy (Signed)
[place] MAIN [facility] SERVICES [address] [place], [place], [address] Phone: [phone]   Fax:  [phone]  Physical Therapy Treatment  Patient Details  Name: [name] [name] MRN: [id number] Date of Birth: 1940/06/28 Referring Provider (PT): Jerrol Banana, MD   Encounter Date: 01/18/2018  PT End of Session - 01/18/18 1429    Visit Number  24    Number of Visits  33    Date for PT Re-Evaluation  01/27/18    Authorization Type  3/10 progress note;    Authorization Time Period   (last goals 12/14/17); next visit start of reporting period 9/26    PT Start Time  1429    PT Stop Time  1507    PT Time Calculation (min)  38 min    Activity Tolerance  Patient tolerated treatment well;No increased pain    Behavior During Therapy  WFL for tasks assessed/performed       Past Medical History:  Diagnosis Date  . Abnormal CT lung screening 10/17/2015  . COPD (chronic obstructive pulmonary disease) ([place])   . Coronary artery disease, non-occlusive    a. cath 2006: min nonobs CAD; b. cath 12/2010: cath LAD 50%, RCA 60%; c. 08/2013: Minimal luminal irregs, right dominant system with no significant CAD, diffuse luminal irregs noted. Normal EF 55%, no AS or MS.   . Diabetes mellitus   . Hyperlipemia    Followed by Dr. Rosanna Randy  . Hypertension   . Lung cancer ([place])   . Macular degeneration    rt  . Personal history of tobacco use, presenting hazards to health 10/15/2015  . Pneumonia    hx  . Shortness of breath   . Stroke [facility])     Past Surgical History:  Procedure Laterality Date  . ABDOMINAL HYSTERECTOMY    . ANTERIOR CERVICAL DECOMP/DISCECTOMY FUSION N/A 10/19/2013   Procedure: CERVICAL FIVE-SIX ANTERIOR CERVICAL DECOMPRESSION WITH FUSION INTERBODY PROSTHESIS PLATING AND PEEK CAGE;  Surgeon: Ophelia Charter, MD;  Location: [place] NEURO ORS;  Service: Neurosurgery;  Laterality: N/A;  . BACK SURGERY  80's  . BREAST CYST EXCISION Left    left  negative   . CARDIAC CATHETERIZATION  05/2004  . CATARACT EXTRACTION Left   . CHOLECYSTECTOMY    . ENDARTERECTOMY Left 10/24/2014   Procedure: ENDARTERECTOMY CAROTID;  Surgeon: Algernon Huxley, MD;  Location: [facility] ORS;  Service: Vascular;  Laterality: Left;  . ENDOBRONCHIAL ULTRASOUND N/A 11/14/2015   Procedure: ENDOBRONCHIAL ULTRASOUND;  Surgeon: [name], MD;  Location: [facility] ORS;  Service: Cardiopulmonary;  Laterality: N/A;  . PERIPHERAL VASCULAR CATHETERIZATION N/A 12/04/2015   Procedure: Glori Luis Cath Insertion;  Surgeon: Algernon Huxley, MD;  Location: [place] CV LAB;  Service: Cardiovascular;  Laterality: N/A;  . PORTA CATH REMOVAL N/A 05/17/2017   Procedure: PORTA CATH REMOVAL;  Surgeon: Algernon Huxley, MD;  Location: [place] CV LAB;  Service: Cardiovascular;  Laterality: N/A;  . TONSILLECTOMY AND ADENOIDECTOMY    . VESICOVAGINAL FISTULA CLOSURE W/ TAH      There were no vitals filed for this visit.  Subjective Assessment - 01/18/18 1435    Subjective  Pt denies pain and no new complaints or concerns.      Pertinent History  Pt presents with reports of imbalance and BLE weakness. Pt reports that she had HHPT ~1 year ago and since has become more weak.  Pt reports 6 falls in the past 6 months.  Pt reports she  loses her balance due to her RLE giving way, causing her to fall.  Pt with h/o stroke but denies it affecting one side more than the other.  Pt with h/o lung cancer with last treatment ~2 years ago.  Pt reports 8/10 LBP which she attributes to falls in the past.  Pt is able to ascend/descend steps but this is challenging.   Pt has a ramp but she still goes up the steps.  Husband does the driving, grocery shopping.  Pt helps with cooking and cleaning.  Pt independent with showering and dressing.  Pt ambulates with her SPC at all times.  Pt checks her BP, pulse, O2, blood glucose each day.  She also checks the bottom of her feet 1x/wk due to her peripheral neuropathy (addressed this  session to check 1x/day).     Currently in Pain?  No/denies        TREATMENT    Ther-ex  Standing hip abd with 2# ankle weight x 15 BLE c BUE support  Standing hip extension with 2# ankle weight x 10 BLE with BUE support  Standing hip flexion marches with 2# ankle weight x 10 BLE with BUE support  Standing HS curls with 2# ankle weight x 10 BLE with BUE support  Standing heel raises x 15 with 2# ankle weights  LAQ with 2# ankle weights with 5 second holds x10 each LE  STS from chair without UE support 2x10  Mini squats x10 with no UE support    Neuromuscular Re-education  Toe taps to 6" step without UE support alternating LE x15 each  Normal stance on Airex pad 2x60sec (requires minA for falls recovery 2 times)?  Forward and backward stepping over hurdle x15 each direction  SIde stepping over hurdle 15x each direction (requires minA for falls recovery 2 times)                         PT Education - 01/18/18 1429    Education Details  Exercise technique    Person(s) Educated  Patient    Methods  Explanation;Demonstration;Verbal cues    Comprehension  Verbalized understanding;Returned demonstration;Verbal cues required;Need further instruction       PT Short Term Goals - 12/14/17 1540      PT SHORT TERM GOAL #1   Title  Pt will independently complete HEP at least 4 days/wk for improved carryover between sessions    Baseline  reports compliance with HEP     Time  2    Period  Weeks    Status  Achieved      PT SHORT TERM GOAL #2   Title  Pt will inspect her feet at least 1x/day for proper foot care due to peripheral neuropathy    Baseline  increasing frequency     Time  2    Period  Weeks    Status  Achieved        PT Long Term Goals - 12/30/17 1523      PT LONG TERM GOAL #1   Title  Pt's ABC scale will improve to at least 50% to demonstrate decreased perception of imbalance    Baseline  20%; 8/15: 45%; 9/26: 57.5%    Time  4    Period   Weeks    Status  Achieved      PT LONG TERM GOAL #2   Title  Pt's TUG time (with SPC) will decrease to less than or equal  to 20 seconds to demonstrate improved mobility    Baseline  32 seconds; 8/15: 31 seconds; 9/26: 18.09 sec    Time  4    Period  Weeks    Status  Achieved      PT LONG TERM GOAL #3   Title  Pt's Berg Balance Test will improve to at least 42/56 to demonstrate decreased risk of falling    Baseline  23/56; 8/15: 33/56; 9/26: 40/56    Time  4    Period  Weeks    Status  Partially Met      PT LONG TERM GOAL #4   Title  Pt's 55mT (with SPC) will improve to at least 0.6 m/s to demonstrate improved gait speed in the home in community    Baseline  0.26 m/s; 8/15: .42 m/s with AD; 9/26: 0.54 m/s    Time  4    Period  Weeks    Status  Partially Met      PT LONG TERM GOAL #5   Title  Pt will demonstrate ability to perform sit<>stand x1 without using UE support for improved QOL    Baseline  Pt requires at least 1UE support    Time  4    Period  Weeks    Status  New            Plan - 01/18/18 1436    Clinical Impression Statement  Started session with standing strengthening exercises which pt tolerated well but did demonstrate fatigue toward end of each set. Pt requires boost with airex to stand from chair without UE support. Pt will benefit from continued skilled PT interventions for improved independence with functional activities.     Rehab Potential  Good    PT Frequency  2x / week    PT Duration  4 weeks    PT Treatment/Interventions  ADLs/Self Care Home Management;Aquatic Therapy;Cryotherapy;Electrical Stimulation;Iontophoresis 457mml Dexamethasone;Moist Heat;Traction;Ultrasound;DME Instruction;Gait training;Stair training;Functional mobility training;Therapeutic activities;Therapeutic exercise;Balance training;Neuromuscular re-education;Patient/family education;Orthotic Fit/Training;Wheelchair mobility training;Manual techniques;Compression bandaging;Passive  range of motion;Dry needling;Energy conservation;Taping    PT Next Visit Plan  Progress HEP and continue with strength and balance training, gait training    PT Home Exercise Plan  seated marching, LAQ    Consulted and Agree with Plan of Care  Patient       Patient will benefit from skilled therapeutic intervention in order to improve the following deficits and impairments:  Abnormal gait, Decreased activity tolerance, Decreased balance, Decreased endurance, Decreased knowledge of use of DME, Decreased mobility, Decreased range of motion, Decreased safety awareness, Decreased strength, Difficulty walking, Hypomobility, Increased fascial restricitons, Impaired perceived functional ability, Impaired flexibility, Increased muscle spasms, Impaired sensation, Improper body mechanics, Postural dysfunction, Pain  Visit Diagnosis: Unsteadiness on feet  Muscle weakness (generalized)  Other abnormalities of gait and mobility     Problem List Patient Active Problem List   Diagnosis Date Noted  . Malnutrition of moderate degree 03/31/2017  . Acute [place] with [place] ([place]/25/2018  . AKI (acute kidney injury) ([place]04/02/2017  . Protein-calorie malnutrition, severe 02/08/2016  . Primary cancer of right upper lobe of lung ([place]08/16/2017  . Abnormal CT lung screening 10/17/2015  . Personal history of tobacco use, presenting hazards to health 10/15/2015  . Chronic vulvitis 09/26/2014  . Allergic reaction 09/26/2014  . Carotid stenosis 08/30/2014  . Cervical nerve root disorder 08/10/2014  . CAD in native artery 08/10/2014  . B12 deficiency 08/10/2014  . Back ache 08/10/2014  .  Bronchitis, chronic ([place]) 08/10/2014  . Diabetes mellitus with polyneuropathy ([place]) 08/10/2014  . Can't get food down 08/10/2014  . Eczema of external ear 08/10/2014  . Accumulation of fluid in tissues 08/10/2014  . Gout 08/10/2014  . Adult hypothyroidism 08/10/2014  . Mononeuritis 08/10/2014  .  Muscle ache 08/10/2014  . Disorder of peripheral nervous system 08/10/2014  . Lesion of vulva 08/10/2014  . Cervical spondylosis with radiculopathy 10/19/2013  . COPD exacerbation ([place]) 03/29/2013  . CAD (coronary artery disease) 06/22/2011  . COPD (chronic obstructive pulmonary disease) with emphysema ([place]) 03/07/2010  . CHEST PAIN UNSPECIFIED 07/22/2009  . HLD (hyperlipidemia) 04/01/2009  . Malaise and fatigue 04/01/2009  . Hyperlipidemia 01/18/2009  . TOBACCO ABUSE 01/18/2009  . HYPERTENSION, BENIGN 01/18/2009  . CLAUDICATION 01/18/2009  . Pain in limb 01/18/2009  . CAFL (chronic airflow limitation) ([place]) 01/20/2007  . Late effects of cerebrovascular disease 01/10/2007  . Essential (primary) hypertension 12/22/2006    Collie Siad PT, DPT 01/18/2018, 3:13 PM  [place] MAIN [facility] SERVICES [address] [place], [place], [address] Phone: [phone]   Fax:  [phone]  Name: [name] [name] MRN: [id number] Date of Birth: Sep 04, 1940

## 2018-01-20 ENCOUNTER — Ambulatory Visit: Payer: Medicare Other

## 2018-01-20 DIAGNOSIS — R2689 Other abnormalities of gait and mobility: Secondary | ICD-10-CM | POA: Diagnosis not present

## 2018-01-20 DIAGNOSIS — M6281 Muscle weakness (generalized): Secondary | ICD-10-CM

## 2018-01-20 DIAGNOSIS — R2681 Unsteadiness on feet: Secondary | ICD-10-CM

## 2018-01-20 NOTE — Therapy (Signed)
[place] MAIN [facility] SERVICES [address] [place], [place], [address] Phone: [phone]   Fax:  [phone]  Physical Therapy Treatment  Patient Details  Name: [name] [name] MRN: [id number] Date of Birth: 1940/09/07 Referring Provider (PT): Jerrol Banana, MD   Encounter Date: 01/20/2018  PT End of Session - 01/20/18 1433    Visit Number  25    Number of Visits  33    Date for PT Re-Evaluation  01/27/18    Authorization Type  4/10 progress note;    Authorization Time Period  Last goals: 9/26    PT Start Time  1435    PT Stop Time  1515    PT Time Calculation (min)  40 min    Equipment Utilized During Treatment  Gait belt    Activity Tolerance  Patient tolerated treatment well    Behavior During Therapy  WFL for tasks assessed/performed       Past Medical History:  Diagnosis Date  . Abnormal CT lung screening 10/17/2015  . COPD (chronic obstructive pulmonary disease) ([place])   . Coronary artery disease, non-occlusive    a. cath 2006: min nonobs CAD; b. cath 12/2010: cath LAD 50%, RCA 60%; c. 08/2013: Minimal luminal irregs, right dominant system with no significant CAD, diffuse luminal irregs noted. Normal EF 55%, no AS or MS.   . Diabetes mellitus   . Hyperlipemia    Followed by Dr. Rosanna Randy  . Hypertension   . Lung cancer ([place])   . Macular degeneration    rt  . Personal history of tobacco use, presenting hazards to health 10/15/2015  . Pneumonia    hx  . Shortness of breath   . Stroke [facility])     Past Surgical History:  Procedure Laterality Date  . ABDOMINAL HYSTERECTOMY    . ANTERIOR CERVICAL DECOMP/DISCECTOMY FUSION N/A 10/19/2013   Procedure: CERVICAL FIVE-SIX ANTERIOR CERVICAL DECOMPRESSION WITH FUSION INTERBODY PROSTHESIS PLATING AND PEEK CAGE;  Surgeon: Ophelia Charter, MD;  Location: [place] NEURO ORS;  Service: Neurosurgery;  Laterality: N/A;  . BACK SURGERY  80's  . BREAST CYST EXCISION Left    left negative   .  CARDIAC CATHETERIZATION  05/2004  . CATARACT EXTRACTION Left   . CHOLECYSTECTOMY    . ENDARTERECTOMY Left 10/24/2014   Procedure: ENDARTERECTOMY CAROTID;  Surgeon: Algernon Huxley, MD;  Location: [facility] ORS;  Service: Vascular;  Laterality: Left;  . ENDOBRONCHIAL ULTRASOUND N/A 11/14/2015   Procedure: ENDOBRONCHIAL ULTRASOUND;  Surgeon: [name], MD;  Location: [facility] ORS;  Service: Cardiopulmonary;  Laterality: N/A;  . PERIPHERAL VASCULAR CATHETERIZATION N/A 12/04/2015   Procedure: Glori Luis Cath Insertion;  Surgeon: Algernon Huxley, MD;  Location: [place] CV LAB;  Service: Cardiovascular;  Laterality: N/A;  . PORTA CATH REMOVAL N/A 05/17/2017   Procedure: PORTA CATH REMOVAL;  Surgeon: Algernon Huxley, MD;  Location: [place] CV LAB;  Service: Cardiovascular;  Laterality: N/A;  . TONSILLECTOMY AND ADENOIDECTOMY    . VESICOVAGINAL FISTULA CLOSURE W/ TAH      There were no vitals filed for this visit.  Subjective Assessment - 01/20/18 1433    Subjective  Pt reports that she is doing well today with the exception of some acute onset R knee pain. "I think I must have twisted it." Pt is able to bear weight and walk. She denies any falls or trauma to knee. No specific questions or concerns.     Pertinent History  Pt presents  with reports of imbalance and BLE weakness. Pt reports that she had HHPT ~1 year ago and since has become more weak.  Pt reports 6 falls in the past 6 months.  Pt reports she loses her balance due to her RLE giving way, causing her to fall.  Pt with h/o stroke but denies it affecting one side more than the other.  Pt with h/o lung cancer with last treatment ~2 years ago.  Pt reports 8/10 LBP which she attributes to falls in the past.  Pt is able to ascend/descend steps but this is challenging.   Pt has a ramp but she still goes up the steps.  Husband does the driving, grocery shopping.  Pt helps with cooking and cleaning.  Pt independent with showering and dressing.  Pt ambulates with her  SPC at all times.  Pt checks her BP, pulse, O2, blood glucose each day.  She also checks the bottom of her feet 1x/wk due to her peripheral neuropathy (addressed this session to check 1x/day).     Currently in Pain?  No/denies    Pain Score  7     Pain Location  Knee    Pain Orientation  Right    Pain Descriptors / Indicators  Sharp    Pain Type  Acute pain    Pain Onset  Yesterday         TREATMENT  Ther-ex  NuStep L2 x 5 minutes for warm-up during history (4 minutes unbilled); Quantum L single leg press 60# x 15, avoided RLE secondary to R knee pain today; Standing hip abd with 2# ankle weight x 15 BLE c BUE support  Standing hip extension with 2# ankle weight x 10 BLE with BUE support  Standing hip flexion marches with 2# ankle weight x 10 BLE with BUE support  Standing HS curls with 2# ankle weight x 10 BLE with BUE support   Neuromuscular Re-education  Agility ladder training with side, forward, and backwards stepping with and without single point cane x 10 minutes; Toe taps to 6" step without UE support alternating LE x 15 each without UE support  Airex pad ball passes around body bilateral x multiple bouts each with min/modA correction    Pt educated throughout session about proper posture and technique with exercises. Improved exercise technique, movement at target joints, use of target muscles after min to mod verbal, visual, tactile cues.    Pt demonstrates good motivation during session. She struggles with agility ladder balance training when she is performing without her cane. She is able to perform single leg press today since RLE was avoided secondary to acute onset pain. Pt encouraged to follow up with her MD if pain does not improve. Pt will benefit from continued skilled PT interventions for improved independence with functional activities                    PT Short Term Goals - 12/14/17 1540      PT SHORT TERM GOAL #1   Title  Pt will  independently complete HEP at least 4 days/wk for improved carryover between sessions    Baseline  reports compliance with HEP     Time  2    Period  Weeks    Status  Achieved      PT SHORT TERM GOAL #2   Title  Pt will inspect her feet at least 1x/day for proper foot care due to peripheral neuropathy    Baseline  increasing frequency     Time  2    Period  Weeks    Status  Achieved        PT Long Term Goals - 12/30/17 1523      PT LONG TERM GOAL #1   Title  Pt's ABC scale will improve to at least 50% to demonstrate decreased perception of imbalance    Baseline  20%; 8/15: 45%; 9/26: 57.5%    Time  4    Period  Weeks    Status  Achieved      PT LONG TERM GOAL #2   Title  Pt's TUG time (with SPC) will decrease to less than or equal to 20 seconds to demonstrate improved mobility    Baseline  32 seconds; 8/15: 31 seconds; 9/26: 18.09 sec    Time  4    Period  Weeks    Status  Achieved      PT LONG TERM GOAL #3   Title  Pt's Berg Balance Test will improve to at least 42/56 to demonstrate decreased risk of falling    Baseline  23/56; 8/15: 33/56; 9/26: 40/56    Time  4    Period  Weeks    Status  Partially Met      PT LONG TERM GOAL #4   Title  Pt's 75mT (with SPC) will improve to at least 0.6 m/s to demonstrate improved gait speed in the home in community    Baseline  0.26 m/s; 8/15: .42 m/s with AD; 9/26: 0.54 m/s    Time  4    Period  Weeks    Status  Partially Met      PT LONG TERM GOAL #5   Title  Pt will demonstrate ability to perform sit<>stand x1 without using UE support for improved QOL    Baseline  Pt requires at least 1UE support    Time  4    Period  Weeks    Status  New            Plan - 01/20/18 1434    Clinical Impression Statement  Pt demonstrates good motivation during session. She struggles with agility ladder balance training when she is performing without her cane. She is able to perform single leg press today since RLE was avoided  secondary to acute onset pain. Pt encouraged to followup with her MD if pain does not improve. Pt will benefit from continued skilled PT interventions for improved independence with functional activities    Rehab Potential  Good    PT Frequency  2x / week    PT Duration  4 weeks    PT Treatment/Interventions  ADLs/Self Care Home Management;Aquatic Therapy;Cryotherapy;Electrical Stimulation;Iontophoresis '4mg'$ /ml Dexamethasone;Moist Heat;Traction;Ultrasound;DME Instruction;Gait training;Stair training;Functional mobility training;Therapeutic activities;Therapeutic exercise;Balance training;Neuromuscular re-education;Patient/family education;Orthotic Fit/Training;Wheelchair mobility training;Manual techniques;Compression bandaging;Passive range of motion;Dry needling;Energy conservation;Taping    PT Next Visit Plan  Progress HEP and continue with strength and balance training, gait training    PT Home Exercise Plan  seated marching, LAQ    Consulted and Agree with Plan of Care  Patient       Patient will benefit from skilled therapeutic intervention in order to improve the following deficits and impairments:  Abnormal gait, Decreased activity tolerance, Decreased balance, Decreased endurance, Decreased knowledge of use of DME, Decreased mobility, Decreased range of motion, Decreased safety awareness, Decreased strength, Difficulty walking, Hypomobility, Increased fascial restricitons, Impaired perceived functional ability, Impaired flexibility, Increased muscle spasms, Impaired sensation, Improper body mechanics, Postural  dysfunction, Pain  Visit Diagnosis: Unsteadiness on feet  Muscle weakness (generalized)  Other abnormalities of gait and mobility     Problem List Patient Active Problem List   Diagnosis Date Noted  . Malnutrition of moderate degree 03/31/2017  . Acute respiratory failure with hypoxia ([place]) 03/30/2017  . AKI (acute kidney injury) ([place]) 07/15/2016  . Protein-calorie  malnutrition, severe 02/08/2016  . Primary cancer of right upper lobe of lung ([place]) 11/20/2015  . Abnormal CT lung screening 10/17/2015  . Personal history of tobacco use, presenting hazards to health 10/15/2015  . Chronic vulvitis 09/26/2014  . Allergic reaction 09/26/2014  . Carotid stenosis 08/30/2014  . Cervical nerve root disorder 08/10/2014  . CAD in native artery 08/10/2014  . B12 deficiency 08/10/2014  . Back ache 08/10/2014  . Bronchitis, chronic ([place]) 08/10/2014  . Diabetes mellitus with polyneuropathy ([place]) 08/10/2014  . Can't get food down 08/10/2014  . Eczema of external ear 08/10/2014  . Accumulation of fluid in tissues 08/10/2014  . Gout 08/10/2014  . Adult hypothyroidism 08/10/2014  . Mononeuritis 08/10/2014  . Muscle ache 08/10/2014  . Disorder of peripheral nervous system 08/10/2014  . Lesion of vulva 08/10/2014  . Cervical spondylosis with radiculopathy 10/19/2013  . COPD exacerbation ([place]) 03/29/2013  . CAD (coronary artery disease) 06/22/2011  . COPD (chronic obstructive pulmonary disease) with emphysema ([place]) 03/07/2010  . CHEST PAIN UNSPECIFIED 07/22/2009  . HLD (hyperlipidemia) 04/01/2009  . Malaise and fatigue 04/01/2009  . Hyperlipidemia 01/18/2009  . TOBACCO ABUSE 01/18/2009  . HYPERTENSION, BENIGN 01/18/2009  . CLAUDICATION 01/18/2009  . Pain in limb 01/18/2009  . CAFL (chronic airflow limitation) ([place]) 01/20/2007  . Late effects of cerebrovascular disease 01/10/2007  . Essential (primary) hypertension 12/22/2006    [name] [name] 01/21/2018, 12:32 PM  [place] MAIN [facility] SERVICES [address] [place], [place], [address] Phone: [phone]   Fax:  [phone]  Name: [name] [name] MRN: [id number] Date of Birth: [birth year]

## 2018-01-23 DIAGNOSIS — R0602 Shortness of breath: Secondary | ICD-10-CM | POA: Diagnosis not present

## 2018-01-23 DIAGNOSIS — J449 Chronic obstructive pulmonary disease, unspecified: Secondary | ICD-10-CM | POA: Diagnosis not present

## 2018-01-24 ENCOUNTER — Ambulatory Visit: Payer: Medicare Other

## 2018-01-24 DIAGNOSIS — R2681 Unsteadiness on feet: Secondary | ICD-10-CM

## 2018-01-24 DIAGNOSIS — R2689 Other abnormalities of gait and mobility: Secondary | ICD-10-CM | POA: Diagnosis not present

## 2018-01-24 DIAGNOSIS — M6281 Muscle weakness (generalized): Secondary | ICD-10-CM | POA: Diagnosis not present

## 2018-01-24 NOTE — Therapy (Signed)
[place] MAIN [facility] SERVICES [address] [place], [place], [address] Phone: [phone]   Fax:  [phone]  Physical Therapy Treatment/Progress Note/Recertification  Dates of reporting period  12/30/17   to   01/24/18  Patient Details  Name: [name] [name] MRN: [id number] Date of Birth: [birth year] Referring Provider (PT): Jerrol Banana, MD   Encounter Date: 01/24/2018  PT End of Session - 01/24/18 1312    Visit Number  26    Number of Visits  45    Date for PT Re-Evaluation  03/07/18    Authorization Type  5/10 progress note, next visit is 1/10    Authorization Time Period  Last goals: 9/26 (performed today 01/24/18)    PT Start Time  1305    PT Stop Time  1345    PT Time Calculation (min)  40 min    Equipment Utilized During Treatment  Gait belt    Activity Tolerance  Patient tolerated treatment well    Behavior During Therapy  WFL for tasks assessed/performed       Past Medical History:  Diagnosis Date  . Abnormal CT lung screening 10/17/2015  . COPD (chronic obstructive pulmonary disease) ([place])   . Coronary artery disease, non-occlusive    a. cath 2006: min nonobs CAD; b. cath 12/2010: cath LAD 50%, RCA 60%; c. 08/2013: Minimal luminal irregs, right dominant system with no significant CAD, diffuse luminal irregs noted. Normal EF 55%, no AS or MS.   . Diabetes mellitus   . Hyperlipemia    Followed by Dr. Rosanna Randy  . Hypertension   . Lung cancer ([place])   . Macular degeneration    rt  . Personal history of tobacco use, presenting hazards to health 10/15/2015  . Pneumonia    hx  . Shortness of breath   . Stroke [facility])     Past Surgical History:  Procedure Laterality Date  . ABDOMINAL HYSTERECTOMY    . ANTERIOR CERVICAL DECOMP/DISCECTOMY FUSION N/A 10/19/2013   Procedure: CERVICAL FIVE-SIX ANTERIOR CERVICAL DECOMPRESSION WITH FUSION INTERBODY PROSTHESIS PLATING AND PEEK CAGE;  Surgeon: Ophelia Charter, MD;  Location: [place]  NEURO ORS;  Service: Neurosurgery;  Laterality: N/A;  . BACK SURGERY  80's  . BREAST CYST EXCISION Left    left negative   . CARDIAC CATHETERIZATION  05/2004  . CATARACT EXTRACTION Left   . CHOLECYSTECTOMY    . ENDARTERECTOMY Left 10/24/2014   Procedure: ENDARTERECTOMY CAROTID;  Surgeon: Algernon Huxley, MD;  Location: [facility] ORS;  Service: Vascular;  Laterality: Left;  . ENDOBRONCHIAL ULTRASOUND N/A 11/14/2015   Procedure: ENDOBRONCHIAL ULTRASOUND;  Surgeon: [name], MD;  Location: [facility] ORS;  Service: Cardiopulmonary;  Laterality: N/A;  . PERIPHERAL VASCULAR CATHETERIZATION N/A 12/04/2015   Procedure: Glori Luis Cath Insertion;  Surgeon: Algernon Huxley, MD;  Location: [place] CV LAB;  Service: Cardiovascular;  Laterality: N/A;  . PORTA CATH REMOVAL N/A 05/17/2017   Procedure: PORTA CATH REMOVAL;  Surgeon: Algernon Huxley, MD;  Location: [place] CV LAB;  Service: Cardiovascular;  Laterality: N/A;  . TONSILLECTOMY AND ADENOIDECTOMY    . VESICOVAGINAL FISTULA CLOSURE W/ TAH      There were no vitals filed for this visit.  Subjective Assessment - 01/24/18 1311    Subjective  Pt reports that she is doing well today. She sufferred one fall since her last therapy session. She fell onto her R knee but is not having any pain. The pain in her R  knee from last week has also improved. No sspecific questions or concerns at this time.    Pertinent History  Pt presents with reports of imbalance and BLE weakness. Pt reports that she had HHPT ~1 year ago and since has become more weak.  Pt reports 6 falls in the past 6 months.  Pt reports she loses her balance due to her RLE giving way, causing her to fall.  Pt with h/o stroke but denies it affecting one side more than the other.  Pt with h/o lung cancer with last treatment ~2 years ago.  Pt reports 8/10 LBP which she attributes to falls in the past.  Pt is able to ascend/descend steps but this is challenging.   Pt has a ramp but she still goes up the steps.   Husband does the driving, grocery shopping.  Pt helps with cooking and cleaning.  Pt independent with showering and dressing.  Pt ambulates with her SPC at all times.  Pt checks her BP, pulse, O2, blood glucose each day.  She also checks the bottom of her feet 1x/wk due to her peripheral neuropathy (addressed this session to check 1x/day).     Currently in Pain?  No/denies        TREATMENT  Ther-ex  Standing hip abd with 2# ankle weight x 15 BLE with BUE support  Standing hip extension with 2# ankle weight x 10 BLE with BUE support  Standing hip flexion marches with 2# ankle weight x 10 BLE with BUE support  Standing HS curls with 2# ankle weight x 10 BLE with BUE support  Sit to stand without UE support from regular height chair x 5; Pt completed ABC (unbilled); Performed TUG (21.1s) 9mgait speed (self-selected: 35.0s = 0.29 m/s, fastest: 21.7s=0.46 m/s) and BERG (48/56) with patient; Reviewed outcome measures, updated goals, and discussed plan of care with patient;   Pt educated throughout session about proper posture and technique with exercises. Improved exercise technique, movement at target joints, use of target muscles after min to mod verbal, visual, tactile cues.   Pt demonstrates good motivation during session. Updated outcome measures and goals with patient today. ABC, TUG, and 132mait speed essentially unchanged. However BERG improved from 40/56 at last test to 48/56 today. Pt is also now able to perform a sit to stand without UE support. She is making progress with balance and LE strength. Pt will benefit from continued skilled PT interventions for improved independence with functional activities.      [facility] - 01/24/18 1331      Standardized Balance Assessment   Standardized Balance Assessment  Berg Balance Test;Timed Up and Go Test;10 meter walk test    10 Meter Walk  self-selected: 35.0s = 0.29 m/s, fastest: 21.7s=0.46 m/s      Berg Balance Test    Sit to Stand  Able to stand without using hands and stabilize independently    Standing Unsupported  Able to stand safely 2 minutes    Sitting with Back Unsupported but Feet Supported on Floor or Stool  Able to sit safely and securely 2 minutes    Stand to Sit  Sits safely with minimal use of hands    Transfers  Able to transfer safely, definite need of hands    Standing Unsupported with Eyes Closed  Able to stand 10 seconds safely    Standing Ubsupported with Feet Together  Able to place feet together independently and stand 1 minute safely  From Standing, Reach Forward with Outstretched Arm  Can reach confidently >25 cm (10")    From Standing Position, Pick up Object from [place] to pick up shoe safely and easily    From Standing Position, Turn to Look Behind Over each Shoulder  Looks behind one side only/other side shows less weight shift    Turn 360 Degrees  Able to turn 360 degrees safely one side only in 4 seconds or less    Standing Unsupported, Alternately Place Feet on Step/Stool  Able to stand independently and safely and complete 8 steps in 20 seconds    Standing Unsupported, One Foot in [place] to take small step independently and hold 30 seconds    Standing on One Leg  Tries to lift leg/unable to hold 3 seconds but remains standing independently    Total Score  48      Timed Up and Go Test   TUG  Normal TUG    Normal TUG (seconds)  21.1                             PT Short Term Goals - 01/24/18 1314      PT SHORT TERM GOAL #1   Title  Pt will independently complete HEP at least 4 days/wk for improved carryover between sessions    Baseline  reports compliance with HEP, 01/24/18: performing HEP daily    Time  2    Period  Weeks    Status  Achieved      PT SHORT TERM GOAL #2   Title  Pt will inspect her feet at least 1x/day for proper foot care due to peripheral neuropathy    Baseline  increasing frequency; 01/24/18: performs daily     Time  2    Period  Weeks    Status  Achieved        PT Long Term Goals - 01/24/18 1315      PT LONG TERM GOAL #1   Title  Pt's ABC scale will improve to at least 50% to demonstrate decreased perception of imbalance    Baseline  20%; 8/15: 45%; 9/26: 57.5%; 01/24/18: 55%    Time  4    Period  Weeks    Status  Achieved      PT LONG TERM GOAL #2   Title  Pt's TUG time (with SPC) will decrease to less than or equal to 20 seconds to demonstrate improved mobility    Baseline  32 seconds; 8/15: 31 seconds; 9/26: 18.09 sec; 01/24/18: 21.1s    Time  4    Period  Weeks    Status  Achieved      PT LONG TERM GOAL #3   Title  Pt's Berg Balance Test will improve to at least 42/56 to demonstrate decreased risk of falling    Baseline  23/56; 8/15: 33/56; 9/26: 40/56; 01/24/18: 48/56    Time  4    Period  Weeks    Status  Achieved      PT LONG TERM GOAL #4   Title  Pt's 74mT (with SPC) will improve to at least 0.6 m/s to demonstrate improved gait speed in the home in community    Baseline  0.26 m/s; 8/15: .42 m/s with AD; 9/26: 0.54 m/s; 01/24/18: self-selected: 35.0s = 0.29 m/s, fastest: 21.7s=0.46 m/s    Time  4    Period  Weeks  Status  Partially Met      PT LONG TERM GOAL #5   Title  Pt will demonstrate ability to perform sit<>stand x1 without using UE support for improved QOL    Baseline  Pt requires at least 1UE support; 01/24/18: Able to perform without UE support    Time  4    Period  Weeks    Status  Achieved      Additional Long Term Goals   Additional Long Term Goals  Yes      PT LONG TERM GOAL #6   Title  Pt will improve BERG by at least 3 points in order to demonstrate clinically significant improvement in balance.      Baseline  23/56; 8/15: 33/56; 9/26: 40/56; 01/24/18: 48/56    Time  6    Period  Weeks    Status  New    Target Date  03/07/18            Plan - 01/24/18 1313    Clinical Impression Statement  Pt demonstrates good motivation during  session. Updated outcome measures and goals with patient today. ABC, TUG, and 37mgait speed essentially unchanged. However BERG improved from 40/56 at last test to 48/56 today. Pt is also now able to perform a sit to stand without UE support. She is making progress with balance and LE strength. Pt will benefit from continued skilled PT interventions for improved independence with functional activities.    Rehab Potential  Good    PT Frequency  2x / week    PT Duration  6 weeks    PT Treatment/Interventions  ADLs/Self Care Home Management;Aquatic Therapy;Cryotherapy;Electrical Stimulation;Iontophoresis 437mml Dexamethasone;Moist Heat;Traction;Ultrasound;DME Instruction;Gait training;Stair training;Functional mobility training;Therapeutic activities;Therapeutic exercise;Balance training;Neuromuscular re-education;Patient/family education;Orthotic Fit/Training;Wheelchair mobility training;Manual techniques;Compression bandaging;Passive range of motion;Dry needling;Energy conservation;Taping    PT Next Visit Plan  Progress HEP as needed and continue with strength and balance training, gait training    PT Home Exercise Plan  seated marching, LAQ    Consulted and Agree with Plan of Care  Patient       Patient will benefit from skilled therapeutic intervention in order to improve the following deficits and impairments:  Abnormal gait, Decreased activity tolerance, Decreased balance, Decreased endurance, Decreased knowledge of use of DME, Decreased mobility, Decreased range of motion, Decreased safety awareness, Decreased strength, Difficulty walking, Hypomobility, Increased fascial restricitons, Impaired perceived functional ability, Impaired flexibility, Increased muscle spasms, Impaired sensation, Improper body mechanics, Postural dysfunction, Pain  Visit Diagnosis: Unsteadiness on feet  Muscle weakness (generalized)     Problem List Patient Active Problem List   Diagnosis Date Noted  .  Malnutrition of moderate degree 03/31/2017  . Acute respiratory failure with hypoxia ([facility]12/25/2018  . AKI (acute kidney injury) ([place]04/02/2017  . Protein-calorie malnutrition, severe 02/08/2016  . Primary cancer of right upper lobe of lung ([place]08/16/2017  . Abnormal CT lung screening 10/17/2015  . Personal history of tobacco use, presenting hazards to health 10/15/2015  . Chronic vulvitis 09/26/2014  . Allergic reaction 09/26/2014  . Carotid stenosis 08/30/2014  . Cervical nerve root disorder 08/10/2014  . CAD in native artery 08/10/2014  . B12 deficiency 08/10/2014  . Back ache 08/10/2014  . Bronchitis, chronic ([facility]05/09/2014  . Diabetes mellitus with polyneuropathy ([facility]05/09/2014  . Can't get food down 08/10/2014  . Eczema of external ear 08/10/2014  . Accumulation of fluid in tissues 08/10/2014  . Gout 08/10/2014  . Adult hypothyroidism 08/10/2014  . Mononeuritis 08/10/2014  .  Muscle ache 08/10/2014  . Disorder of peripheral nervous system 08/10/2014  . Lesion of vulva 08/10/2014  . Cervical spondylosis with radiculopathy 10/19/2013  . COPD exacerbation ([place]) 03/29/2013  . CAD (coronary artery disease) 06/22/2011  . COPD (chronic obstructive pulmonary disease) with emphysema ([place]) 03/07/2010  . CHEST PAIN UNSPECIFIED 07/22/2009  . HLD (hyperlipidemia) 04/01/2009  . Malaise and fatigue 04/01/2009  . Hyperlipidemia 01/18/2009  . TOBACCO ABUSE 01/18/2009  . HYPERTENSION, BENIGN 01/18/2009  . CLAUDICATION 01/18/2009  . Pain in limb 01/18/2009  . CAFL (chronic airflow limitation) ([place]) 01/20/2007  . Late effects of cerebrovascular disease 01/10/2007  . Essential (primary) hypertension 12/22/2006   Phillips Grout PT, DPT, GCS  Huprich,Jason 01/24/2018, 3:38 PM  [place] MAIN [facility] SERVICES [address] [place], [place], [address] Phone: [phone]   Fax:  [phone]  Name: [name] [name] MRN: [id number] Date of  Birth: April 18, 1940

## 2018-01-25 ENCOUNTER — Ambulatory Visit (INDEPENDENT_AMBULATORY_CARE_PROVIDER_SITE_OTHER): Payer: Medicare Other | Admitting: Family Medicine

## 2018-01-25 DIAGNOSIS — E782 Mixed hyperlipidemia: Secondary | ICD-10-CM | POA: Diagnosis not present

## 2018-01-25 DIAGNOSIS — I10 Essential (primary) hypertension: Secondary | ICD-10-CM | POA: Diagnosis not present

## 2018-01-25 DIAGNOSIS — J432 Centrilobular emphysema: Secondary | ICD-10-CM

## 2018-01-25 DIAGNOSIS — E1342 Other specified diabetes mellitus with diabetic polyneuropathy: Secondary | ICD-10-CM

## 2018-01-25 DIAGNOSIS — E039 Hypothyroidism, unspecified: Secondary | ICD-10-CM

## 2018-01-25 DIAGNOSIS — J449 Chronic obstructive pulmonary disease, unspecified: Secondary | ICD-10-CM | POA: Diagnosis not present

## 2018-01-25 DIAGNOSIS — Z23 Encounter for immunization: Secondary | ICD-10-CM | POA: Diagnosis not present

## 2018-01-25 DIAGNOSIS — C3411 Malignant neoplasm of upper lobe, right bronchus or lung: Secondary | ICD-10-CM

## 2018-01-25 LAB — POCT GLYCOSYLATED HEMOGLOBIN (HGB A1C): HEMOGLOBIN A1C: 6.9 % — AB (ref 4.0–5.6)

## 2018-01-25 NOTE — Progress Notes (Signed)
Patient: [name] [name] Female    DOB: 1940-06-12   77 y.o.   MRN: [id number] Visit Date: 01/25/2018  Today's Provider: [name], MD   Chief Complaint  Patient presents with  . Diabetes  . Hypertension  . Hyperlipidemia  . Hypothyroidism   Subjective:    Diabetes  She presents for her follow-up diabetic visit. She has type 2 diabetes mellitus. Pertinent negatives for hypoglycemia include no dizziness, headaches or sweats. Associated symptoms include blurred vision. Pertinent negatives for diabetes include no chest pain. Home blood sugar record trend: Fashting sugars are around high 100's.   Hypertension  This is a chronic problem. The problem is unchanged. The problem is controlled. Associated symptoms include blurred vision. Pertinent negatives include no anxiety, chest pain, headaches, malaise/fatigue, neck pain, orthopnea, palpitations, peripheral edema, PND, shortness of breath or sweats. There are no associated agents to hypertension. There are no compliance problems.   Hyperlipidemia  This is a chronic problem. The problem is controlled. Recent lipid tests were reviewed and are high. Pertinent negatives include no chest pain, focal weakness or shortness of breath.   Lab Results  Component Value Date   HGBA1C 6.9 (A) 01/25/2018   Lab Results  Component Value Date   CHOL 210 (H) 05/24/2017   CHOL 186 08/19/2015   CHOL 195 08/30/2014   Lab Results  Component Value Date   HDL 52 05/24/2017   HDL 55 08/19/2015   HDL 47 08/30/2014   Lab Results  Component Value Date   LDLCALC 88 05/24/2017   LDLCALC 86 08/19/2015   LDLCALC 109 (H) 08/30/2014   Lab Results  Component Value Date   TRIG 352 (H) 05/24/2017   TRIG 226 (H) 08/19/2015   TRIG 197 (H) 08/30/2014   Lab Results  Component Value Date   CHOLHDL 4.1 08/30/2014   CHOLHDL 3.0 Ratio 08/02/2008   No results found for: LDLDIRECT BP Readings from Last 3 Encounters:  01/13/18 132/68  12/28/17  (!) 148/62  12/21/17 (!) 147/58   Wt Readings from Last 3 Encounters:  01/13/18 152 lb (68.9 kg)  12/20/17 146 lb 4.8 oz (66.4 kg)  12/08/17 152 lb (68.9 kg)   Lab Results  Component Value Date   TSH 2.520 05/24/2017        Allergies  Allergen Reactions  . Coconut Fatty Acids Swelling and Other (See Comments)    Throat swells     Current Outpatient Medications:  .  albuterol (PROVENTIL) (5 MG/ML) 0.5% nebulizer solution, Take 0.5 mLs (2.5 mg total) by nebulization every 6 (six) hours as needed for wheezing or shortness of breath. (Patient taking differently: Take 2.5 mg by nebulization daily. ), Disp: 100 vial, Rfl: 0 .  Blood Glucose Monitoring Suppl (ONE TOUCH ULTRA 2) w/Device KIT, 1 each by Does not apply route See admin instructions., Disp: 1 each, Rfl: 0 .  budesonide-formoterol (SYMBICORT) 160-4.5 MCG/ACT inhaler, Inhale 2 puffs into the lungs 2 (two) times daily., Disp: 3 Inhaler, Rfl: 3 .  fluticasone (FLONASE) 50 MCG/ACT nasal spray, Place 2 sprays into both nostrils daily., Disp: 16 g, Rfl: 6 .  gabapentin (NEURONTIN) 600 MG tablet, TAKE ONE-HALF TABLET BY  MOUTH TWO TIMES DAILY, Disp: 90 tablet, Rfl: 3 .  glimepiride (AMARYL) 2 MG tablet, Take 1 tablet (2 mg total) by mouth 2 (two) times daily., Disp: 180 tablet, Rfl: 3 .  HYDROcodone-acetaminophen (NORCO) 5-325 MG tablet, Take 1 tablet by mouth every 6 (six) hours  as needed for moderate pain or severe pain., Disp: 20 tablet, Rfl: 0 .  isosorbide mononitrate (IMDUR) 30 MG 24 hr tablet, Take 1 tablet (30 mg total) by mouth daily., Disp: 90 tablet, Rfl: 3 .  LORazepam (ATIVAN) 1 MG tablet, Take 1 tablet (1 mg total) by mouth 2 (two) times daily as needed for anxiety., Disp: 30 tablet, Rfl: 5 .  magnesium oxide (MAG-OX) 400 (241.3 MG) MG tablet, Take 400 mg by mouth 2 (two) times daily. , Disp: , Rfl:  .  metoprolol tartrate (LOPRESSOR) 25 MG tablet, TAKE 1 TABLET BY MOUTH TWO  TIMES DAILY, Disp: 180 tablet, Rfl: 3 .   mometasone (ELOCON) 0.1 % cream, Apply small amount nightly to each ear, Disp: 45 g, Rfl: 0 .  Multiple Vitamins-Minerals (ICAPS) CAPS, Take 1 capsule by mouth 2 (two) times daily. , Disp: , Rfl:  .  nitroGLYCERIN (NITROSTAT) 0.4 MG SL tablet, Place 1 tablet (0.4 mg total) under the tongue every 5 (five) minutes as needed for chest pain., Disp: 25 tablet, Rfl: 3 .  ofloxacin (FLOXIN) 0.3 % OTIC solution, PLACE 10 DROPS INTO THE LEFT EAR DAILY., Disp: 5 mL, Rfl: 0 .  ondansetron (ZOFRAN) 8 MG tablet, Take 1 tablet (8 mg total) by mouth every 8 (eight) hours as needed for nausea or vomiting., Disp: 30 tablet, Rfl: 1 .  ONE TOUCH ULTRA TEST test strip, USE TO CHECK BLOOD SUGAR ONCE DAILY, Disp: 100 each, Rfl: 11 .  pantoprazole (PROTONIX) 40 MG tablet, TAKE 1 TABLET BY MOUTH  DAILY, Disp: 90 tablet, Rfl: 3 .  triamcinolone cream (KENALOG) 0.1 %, Apply 1 application topically 2 (two) times daily., Disp: 30 g, Rfl: 0 .  doxycycline (VIBRA-TABS) 100 MG tablet, Take 1 tablet (100 mg total) by mouth 2 (two) times daily., Disp: 20 tablet, Rfl: 0 .  doxycycline (VIBRA-TABS) 100 MG tablet, Take 1 tablet (100 mg total) by mouth 2 (two) times daily., Disp: 20 tablet, Rfl: 0 No current facility-administered medications for this visit.   Facility-Administered Medications Ordered in Other Visits:  .  ondansetron (ZOFRAN) 8 mg, dexamethasone (DECADRON) 10 mg in sodium chloride 0.9 % 50 mL IVPB, , Intravenous, Once, Finnegan, Kathlene November, MD  Review of Systems  Constitutional: Negative.  Negative for malaise/fatigue.  HENT: Negative.   Eyes: Positive for blurred vision.  Respiratory: Negative.  Negative for shortness of breath.   Cardiovascular: Negative for chest pain, palpitations, orthopnea and PND.  Gastrointestinal: Negative.   Endocrine: Negative.   Genitourinary: Negative.   Musculoskeletal: Negative for neck pain.  Allergic/Immunologic: Negative.   Neurological: Negative for dizziness, focal weakness,  light-headedness and headaches.  Psychiatric/Behavioral: Negative.     Social History   Tobacco Use  . Smoking status: Former Smoker    Packs/day: 1.00    Years: 50.00    Pack years: 50.00    Types: Cigarettes    Last attempt to quit: 10/03/2015    Years since quitting: 2.3  . Smokeless tobacco: Never Used  . Tobacco comment: smokes 3 cigs daily 05/06/15. Pt instructed to quit.  Substance Use Topics  . Alcohol use: No    Alcohol/week: 0.0 standard drinks   Objective:   There were no vitals taken for this visit. There were no vitals filed for this visit.    Results for orders placed or performed in visit on 01/25/18  POCT glycosylated hemoglobin (Hb A1C)  Result Value Ref Range   Hemoglobin A1C 6.9 (A) 4.0 - 5.6 %  Physical Exam  Constitutional: She is oriented to person, place, and time. She appears well-developed and well-nourished.  HENT:  Head: Normocephalic and atraumatic.  Right Ear: External ear normal.  Left Ear: External ear normal.  Nose: Nose normal.  Eyes: Conjunctivae are normal. No scleral icterus.  Neck: No thyromegaly present.  Cardiovascular: Normal rate, regular rhythm and normal heart sounds.  Pulmonary/Chest: Effort normal and breath sounds normal.  Abdominal: Soft.  Musculoskeletal: She exhibits no edema.  Neurological: She is alert and oriented to person, place, and time.  Skin: Skin is warm and dry.  Psychiatric: She has a normal mood and affect. Her behavior is normal. Judgment and thought content normal.        Assessment & Plan:      1. HYPERTENSION, BENIGN   2. Diabetic polyneuropathy associated with other specified diabetes mellitus ([place]) Urine microalbumin--20 - POCT glycosylated hemoglobin (Hb A1C)--6.9 today.  3. Adult hypothyroidism   4. Mixed hyperlipidemia   5. Need for influenza vaccination  - Flu vaccine HIGH DOSE PF (Fluzone High dose)  6. Centrilobular emphysema ([place])   7. Primary cancer of right upper  lobe of lung ([place])       I have done the exam and reviewed the above chart and it is accurate to the best of my knowledge. Development worker, community has been used in this note in any air is in the dictation or transcription are unintentional.  [name], MD  [place]

## 2018-02-01 ENCOUNTER — Ambulatory Visit: Payer: Medicare Other | Admitting: Physical Therapy

## 2018-02-01 ENCOUNTER — Encounter: Payer: Self-pay | Admitting: Physical Therapy

## 2018-02-01 DIAGNOSIS — R2681 Unsteadiness on feet: Secondary | ICD-10-CM

## 2018-02-01 DIAGNOSIS — R2689 Other abnormalities of gait and mobility: Secondary | ICD-10-CM | POA: Diagnosis not present

## 2018-02-01 DIAGNOSIS — M6281 Muscle weakness (generalized): Secondary | ICD-10-CM

## 2018-02-01 NOTE — Therapy (Signed)
[place] MAIN [facility] SERVICES [address] [place], [place], [address] Phone: [phone]   Fax:  [phone]  Physical Therapy Treatment  Patient Details  Name: [name] [name] MRN: [id number] Date of Birth: [birth year] Referring Provider (PT): Jerrol Banana, MD   Encounter Date: 02/01/2018  PT End of Session - 02/01/18 1307    Visit Number  27    Number of Visits  45    Date for PT Re-Evaluation  03/07/18    Authorization Type  1/10 progress note,    Authorization Time Period  Last goals: 01/24/18    PT Start Time  1302    PT Stop Time  1345    PT Time Calculation (min)  43 min    Equipment Utilized During Treatment  Gait belt    Activity Tolerance  Patient tolerated treatment well    Behavior During Therapy  [facility] for tasks assessed/performed       Past Medical History:  Diagnosis Date  . Abnormal CT lung screening 10/17/2015  . COPD (chronic obstructive pulmonary disease) ([place])   . Coronary artery disease, non-occlusive    a. cath 2006: min nonobs CAD; b. cath 12/2010: cath LAD 50%, RCA 60%; c. 08/2013: Minimal luminal irregs, right dominant system with no significant CAD, diffuse luminal irregs noted. Normal EF 55%, no AS or MS.   . Diabetes mellitus   . Hyperlipemia    Followed by Dr. Rosanna Randy  . Hypertension   . Lung cancer ([place])   . Macular degeneration    rt  . Personal history of tobacco use, presenting hazards to health 10/15/2015  . Pneumonia    hx  . Shortness of breath   . Stroke [facility])     Past Surgical History:  Procedure Laterality Date  . ABDOMINAL HYSTERECTOMY    . ANTERIOR CERVICAL DECOMP/DISCECTOMY FUSION N/A 10/19/2013   Procedure: CERVICAL FIVE-SIX ANTERIOR CERVICAL DECOMPRESSION WITH FUSION INTERBODY PROSTHESIS PLATING AND PEEK CAGE;  Surgeon: Ophelia Charter, MD;  Location: [place] NEURO ORS;  Service: Neurosurgery;  Laterality: N/A;  . BACK SURGERY  80's  . BREAST CYST EXCISION Left    left negative    . CARDIAC CATHETERIZATION  05/2004  . CATARACT EXTRACTION Left   . CHOLECYSTECTOMY    . ENDARTERECTOMY Left 10/24/2014   Procedure: ENDARTERECTOMY CAROTID;  Surgeon: Algernon Huxley, MD;  Location: [facility] ORS;  Service: Vascular;  Laterality: Left;  . ENDOBRONCHIAL ULTRASOUND N/A 11/14/2015   Procedure: ENDOBRONCHIAL ULTRASOUND;  Surgeon: [name], MD;  Location: [facility] ORS;  Service: Cardiopulmonary;  Laterality: N/A;  . PERIPHERAL VASCULAR CATHETERIZATION N/A 12/04/2015   Procedure: Glori Luis Cath Insertion;  Surgeon: Algernon Huxley, MD;  Location: [place] CV LAB;  Service: Cardiovascular;  Laterality: N/A;  . PORTA CATH REMOVAL N/A 05/17/2017   Procedure: PORTA CATH REMOVAL;  Surgeon: Algernon Huxley, MD;  Location: [place] CV LAB;  Service: Cardiovascular;  Laterality: N/A;  . TONSILLECTOMY AND ADENOIDECTOMY    . VESICOVAGINAL FISTULA CLOSURE W/ TAH      There were no vitals filed for this visit.  Subjective Assessment - 02/01/18 1305    Subjective  Patient reports doing well; Denies any pain; She reports having a primary office visit which went well. She reports still having trouble with walking; denies any new falls since last week;     Pertinent History  Pt presents with reports of imbalance and BLE weakness. Pt reports that she had HHPT ~1 year  ago and since has become more weak.  Pt reports 6 falls in the past 6 months.  Pt reports she loses her balance due to her RLE giving way, causing her to fall.  Pt with h/o stroke but denies it affecting one side more than the other.  Pt with h/o lung cancer with last treatment ~2 years ago.  Pt reports 8/10 LBP which she attributes to falls in the past.  Pt is able to ascend/descend steps but this is challenging.   Pt has a ramp but she still goes up the steps.  Husband does the driving, grocery shopping.  Pt helps with cooking and cleaning.  Pt independent with showering and dressing.  Pt ambulates with her SPC at all times.  Pt checks her BP, pulse,  O2, blood glucose each day.  She also checks the bottom of her feet 1x/wk due to her peripheral neuropathy (addressed this session to check 1x/day).     Currently in Pain?  No/denies    Multiple Pain Sites  No           TREATMENT: PT instructed patient in gait training with tripod base SPC; Patient ambulated 75 feet x2, 60 feet x1 with cane with CGA exhibiting short shuffled steps, narrow base of support with increased foot drop with flat foot at initial contact. Patient required mod VCs to increase ankle DF at heel strike, increase hip flexion/knee flexion during swing phase for better foot clearance; She also required verbal cues to increase step length and improve erect posture. With cues, patient able to improve gait temporarily but when fatigued reverts back to foot drag and short step length. Patient reports increased numbness in BLE from neuropathy and states that also affects her walking as she can't feel her feet on the floor.   Strengthening:  Seated:  Hip flexion 2# x15 bilaterally;  Standing: Hip abduction 2# x15 bilaterally; Heel/toe raises 2# x15 bilaterally; Hamstring curl 2# x15 bilaterally; Patient required min-moderate verbal/tactile cues for correct exercise technique. Patient requires short rest breaks due to fatigue.   Leg press:  BLE plate 90# 6Y56 with min VCs to slow down eccentric return for better strengthening; Patient able to exhibit good motor control and knee control;                    PT Education - 02/01/18 1306    Education Details  exercise, strengthening/balance, gait safety    Person(s) Educated  Patient    Methods  Explanation;Demonstration;Verbal cues    Comprehension  Verbalized understanding;Returned demonstration;Verbal cues required;Need further instruction       PT Short Term Goals - 01/24/18 1314      PT SHORT TERM GOAL #1   Title  Pt will independently complete HEP at least 4 days/wk for improved carryover between  sessions    Baseline  reports compliance with HEP, 01/24/18: performing HEP daily    Time  2    Period  Weeks    Status  Achieved      PT SHORT TERM GOAL #2   Title  Pt will inspect her feet at least 1x/day for proper foot care due to peripheral neuropathy    Baseline  increasing frequency; 01/24/18: performs daily    Time  2    Period  Weeks    Status  Achieved        PT Long Term Goals - 01/24/18 1315      PT LONG TERM GOAL #1  Title  Pt's ABC scale will improve to at least 50% to demonstrate decreased perception of imbalance    Baseline  20%; 8/15: 45%; 9/26: 57.5%; 01/24/18: 55%    Time  4    Period  Weeks    Status  Achieved      PT LONG TERM GOAL #2   Title  Pt's TUG time (with SPC) will decrease to less than or equal to 20 seconds to demonstrate improved mobility    Baseline  32 seconds; 8/15: 31 seconds; 9/26: 18.09 sec; 01/24/18: 21.1s    Time  4    Period  Weeks    Status  Achieved      PT LONG TERM GOAL #3   Title  Pt's Berg Balance Test will improve to at least 42/56 to demonstrate decreased risk of falling    Baseline  23/56; 8/15: 33/56; 9/26: 40/56; 01/24/18: 48/56    Time  4    Period  Weeks    Status  Achieved      PT LONG TERM GOAL #4   Title  Pt's 33mT (with SPC) will improve to at least 0.6 m/s to demonstrate improved gait speed in the home in community    Baseline  0.26 m/s; 8/15: .42 m/s with AD; 9/26: 0.54 m/s; 01/24/18: self-selected: 35.0s = 0.29 m/s, fastest: 21.7s=0.46 m/s    Time  4    Period  Weeks    Status  Partially Met      PT LONG TERM GOAL #5   Title  Pt will demonstrate ability to perform sit<>stand x1 without using UE support for improved QOL    Baseline  Pt requires at least 1UE support; 01/24/18: Able to perform without UE support    Time  4    Period  Weeks    Status  Achieved      Additional Long Term Goals   Additional Long Term Goals  Yes      PT LONG TERM GOAL #6   Title  Pt will improve BERG by at least 3 points  in order to demonstrate clinically significant improvement in balance.      Baseline  23/56; 8/15: 33/56; 9/26: 40/56; 01/24/18: 48/56    Time  6    Period  Weeks    Status  New    Target Date  03/07/18            Plan - 02/01/18 1419    Clinical Impression Statement  Patient instructed in gait training with tripod base cane. Patient required CGA and verbal cues to increase step length/increase ankle DF for foot clearance. Patient able to improve her gait temporarily but with increased fatigue returns to shorter step length with foot drag. Patient instructed in advanced strengthening exercise. SHe does fatigue quickly requiring short sitting rest breaks. She would benefit from additional skilled PT intervention to improve strength, balance and gait safety;     Rehab Potential  Good    PT Frequency  2x / week    PT Duration  6 weeks    PT Treatment/Interventions  ADLs/Self Care Home Management;Aquatic Therapy;Cryotherapy;Electrical Stimulation;Iontophoresis 46mml Dexamethasone;Moist Heat;Traction;Ultrasound;DME Instruction;Gait training;Stair training;Functional mobility training;Therapeutic activities;Therapeutic exercise;Balance training;Neuromuscular re-education;Patient/family education;Orthotic Fit/Training;Wheelchair mobility training;Manual techniques;Compression bandaging;Passive range of motion;Dry needling;Energy conservation;Taping    PT Next Visit Plan  Progress HEP as needed and continue with strength and balance training, gait training    PT Home Exercise Plan  seated marching, LAQ    Consulted and Agree with Plan  of Care  Patient       Patient will benefit from skilled therapeutic intervention in order to improve the following deficits and impairments:  Abnormal gait, Decreased activity tolerance, Decreased balance, Decreased endurance, Decreased knowledge of use of DME, Decreased mobility, Decreased range of motion, Decreased safety awareness, Decreased strength, Difficulty  walking, Hypomobility, Increased fascial restricitons, Impaired perceived functional ability, Impaired flexibility, Increased muscle spasms, Impaired sensation, Improper body mechanics, Postural dysfunction, Pain  Visit Diagnosis: Unsteadiness on feet  Muscle weakness (generalized)  Other abnormalities of gait and mobility     Problem List Patient Active Problem List   Diagnosis Date Noted  . Malnutrition of moderate degree 03/31/2017  . Acute respiratory failure with hypoxia ([place]) 03/30/2017  . AKI (acute kidney injury) ([place]) 07/15/2016  . Protein-calorie malnutrition, severe 02/08/2016  . Primary cancer of right upper lobe of lung ([place]) 11/20/2015  . Abnormal CT lung screening 10/17/2015  . Personal history of tobacco use, presenting hazards to health 10/15/2015  . Chronic vulvitis 09/26/2014  . Allergic reaction 09/26/2014  . Carotid stenosis 08/30/2014  . Cervical nerve root disorder 08/10/2014  . CAD in native artery 08/10/2014  . B12 deficiency 08/10/2014  . Back ache 08/10/2014  . Bronchitis, chronic ([place]) 08/10/2014  . Diabetes mellitus with polyneuropathy ([place]) 08/10/2014  . Can't get food down 08/10/2014  . Eczema of external ear 08/10/2014  . Accumulation of fluid in tissues 08/10/2014  . Gout 08/10/2014  . Adult hypothyroidism 08/10/2014  . Mononeuritis 08/10/2014  . Muscle ache 08/10/2014  . Disorder of peripheral nervous system 08/10/2014  . Lesion of vulva 08/10/2014  . Cervical spondylosis with radiculopathy 10/19/2013  . COPD exacerbation ([place]) 03/29/2013  . CAD (coronary artery disease) 06/22/2011  . COPD (chronic obstructive pulmonary disease) with emphysema ([place]) 03/07/2010  . CHEST PAIN UNSPECIFIED 07/22/2009  . HLD (hyperlipidemia) 04/01/2009  . Malaise and fatigue 04/01/2009  . Hyperlipidemia 01/18/2009  . TOBACCO ABUSE 01/18/2009  . HYPERTENSION, BENIGN 01/18/2009  . CLAUDICATION 01/18/2009  . Pain in limb 01/18/2009  . CAFL (chronic airflow  limitation) ([place]) 01/20/2007  . Late effects of cerebrovascular disease 01/10/2007  . Essential (primary) hypertension 12/22/2006    Pedro Oldenburg PT, DPT 02/01/2018, 2:25 PM  [place] MAIN [facility] SERVICES [address] [place], [place], [address] Phone: [phone]   Fax:  [phone]  Name: MARIAFERNANDA HENDRICKSEN MRN: [id number] Date of Birth: Sep 08, 1940

## 2018-02-03 ENCOUNTER — Ambulatory Visit: Payer: Medicare Other | Admitting: Physical Therapy

## 2018-02-09 ENCOUNTER — Ambulatory Visit: Payer: Medicare Other | Attending: Family Medicine | Admitting: Physical Therapy

## 2018-02-09 ENCOUNTER — Encounter: Payer: Self-pay | Admitting: Physical Therapy

## 2018-02-09 DIAGNOSIS — R2689 Other abnormalities of gait and mobility: Secondary | ICD-10-CM | POA: Diagnosis not present

## 2018-02-09 DIAGNOSIS — R2681 Unsteadiness on feet: Secondary | ICD-10-CM | POA: Diagnosis not present

## 2018-02-09 DIAGNOSIS — M6281 Muscle weakness (generalized): Secondary | ICD-10-CM | POA: Insufficient documentation

## 2018-02-09 NOTE — Therapy (Signed)
[place] MAIN [facility] SERVICES [address] [place], [place], [address] Phone: [phone]   Fax:  [phone]  Physical Therapy Treatment  Patient Details  Name: [name] [name] MRN: [id number] Date of Birth: 1941/01/18 Referring Provider (PT): Jerrol Banana, MD   Encounter Date: 02/09/2018  PT End of Session - 02/09/18 1528    Visit Number  28    Number of Visits  45    Date for PT Re-Evaluation  03/07/18    Authorization Type  2/10 progress note,    Authorization Time Period  Last goals: 01/24/18    PT Start Time  0315    PT Stop Time  0400    PT Time Calculation (min)  45 min    Equipment Utilized During Treatment  Gait belt    Activity Tolerance  Patient tolerated treatment well    Behavior During Therapy  [facility] for tasks assessed/performed       Past Medical History:  Diagnosis Date  . Abnormal CT lung screening 10/17/2015  . COPD (chronic obstructive pulmonary disease) ([place])   . Coronary artery disease, non-occlusive    a. cath 2006: min nonobs CAD; b. cath 12/2010: cath LAD 50%, RCA 60%; c. 08/2013: Minimal luminal irregs, right dominant system with no significant CAD, diffuse luminal irregs noted. Normal EF 55%, no AS or MS.   . Diabetes mellitus   . Hyperlipemia    Followed by Dr. Rosanna Randy  . Hypertension   . Lung cancer ([place])   . Macular degeneration    rt  . Personal history of tobacco use, presenting hazards to health 10/15/2015  . Pneumonia    hx  . Shortness of breath   . Stroke [facility])     Past Surgical History:  Procedure Laterality Date  . ABDOMINAL HYSTERECTOMY    . ANTERIOR CERVICAL DECOMP/DISCECTOMY FUSION N/A 10/19/2013   Procedure: CERVICAL FIVE-SIX ANTERIOR CERVICAL DECOMPRESSION WITH FUSION INTERBODY PROSTHESIS PLATING AND PEEK CAGE;  Surgeon: Ophelia Charter, MD;  Location: [place] NEURO ORS;  Service: Neurosurgery;  Laterality: N/A;  . BACK SURGERY  80's  . BREAST CYST EXCISION Left    left negative   .  CARDIAC CATHETERIZATION  05/2004  . CATARACT EXTRACTION Left   . CHOLECYSTECTOMY    . ENDARTERECTOMY Left 10/24/2014   Procedure: ENDARTERECTOMY CAROTID;  Surgeon: Algernon Huxley, MD;  Location: [facility] ORS;  Service: Vascular;  Laterality: Left;  . ENDOBRONCHIAL ULTRASOUND N/A 11/14/2015   Procedure: ENDOBRONCHIAL ULTRASOUND;  Surgeon: [name], MD;  Location: [facility] ORS;  Service: Cardiopulmonary;  Laterality: N/A;  . PERIPHERAL VASCULAR CATHETERIZATION N/A 12/04/2015   Procedure: Glori Luis Cath Insertion;  Surgeon: Algernon Huxley, MD;  Location: [place] CV LAB;  Service: Cardiovascular;  Laterality: N/A;  . PORTA CATH REMOVAL N/A 05/17/2017   Procedure: PORTA CATH REMOVAL;  Surgeon: Algernon Huxley, MD;  Location: [place] CV LAB;  Service: Cardiovascular;  Laterality: N/A;  . TONSILLECTOMY AND ADENOIDECTOMY    . VESICOVAGINAL FISTULA CLOSURE W/ TAH      There were no vitals filed for this visit.  Subjective Assessment - 02/09/18 1527    Subjective  Patient reports doing well; Denies any pain; She reports having a primary office visit which went well. She reports still having trouble with walking; denies any new falls since last week;     Pertinent History  Pt presents with reports of imbalance and BLE weakness. Pt reports that she had HHPT ~1 year  ago and since has become more weak.  Pt reports 6 falls in the past 6 months.  Pt reports she loses her balance due to her RLE giving way, causing her to fall.  Pt with h/o stroke but denies it affecting one side more than the other.  Pt with h/o lung cancer with last treatment ~2 years ago.  Pt reports 8/10 LBP which she attributes to falls in the past.  Pt is able to ascend/descend steps but this is challenging.   Pt has a ramp but she still goes up the steps.  Husband does the driving, grocery shopping.  Pt helps with cooking and cleaning.  Pt independent with showering and dressing.  Pt ambulates with her SPC at all times.  Pt checks her BP, pulse, O2,  blood glucose each day.  She also checks the bottom of her feet 1x/wk due to her peripheral neuropathy (addressed this session to check 1x/day).     How long can you sit comfortably?  no issues    How long can you stand comfortably?  10 minutes    How long can you walk comfortably?  household distances    Patient Stated Goals  Pt would like to be able to walk farther, no falls, up and down steps    Currently in Pain?  No/denies    Pain Score  0-No pain    Pain Onset  Yesterday    Pain Onset  Yesterday       Ther-ex  Standing hip abd with 2# ankle weight x 15 BLE and  BUE support  Standing hip extension with 2# ankle weight x 10 BLE with BUE support  Standing hip flexion marches with 2# ankle weight x 10 BLE with BUE support  Standing HS curls with 2# ankle weight x 10 BLE with BUE support  Standing heel raises x 15 with 2# ankle weights  LAQ with 2# ankle weights with 3 second holds x10 each LE  STS from chair without UE support 2x10  Mini squats x10 with no UE support    Neuromuscular Re-education  Toe taps to 6" step without UE support alternating LE x15 each  Normal stance on Airex pad 2x60sec (requires minA for falls recovery 2 times)?  Forward and backward stepping over hurdle x15 each direction  SIde stepping over hurdle 15x each direction (requires minA for falls)                        PT Education - 02/09/18 1527    Education Details  exercise, strengthening/balance, gait safetyercise, strengthening/balance, gait safety    Person(s) Educated  Patient    Methods  Explanation    Comprehension  Verbalized understanding;Returned demonstration;Need further instruction       PT Short Term Goals - 01/24/18 1314      PT SHORT TERM GOAL #1   Title  Pt will independently complete HEP at least 4 days/wk for improved carryover between sessions    Baseline  reports compliance with HEP, 01/24/18: performing HEP daily    Time  2    Period  Weeks    Status   Achieved      PT SHORT TERM GOAL #2   Title  Pt will inspect her feet at least 1x/day for proper foot care due to peripheral neuropathy    Baseline  increasing frequency; 01/24/18: performs daily    Time  2    Period  Weeks  Status  Achieved        PT Long Term Goals - 01/24/18 1315      PT LONG TERM GOAL #1   Title  Pt's ABC scale will improve to at least 50% to demonstrate decreased perception of imbalance    Baseline  20%; 8/15: 45%; 9/26: 57.5%; 01/24/18: 55%    Time  4    Period  Weeks    Status  Achieved      PT LONG TERM GOAL #2   Title  Pt's TUG time (with SPC) will decrease to less than or equal to 20 seconds to demonstrate improved mobility    Baseline  32 seconds; 8/15: 31 seconds; 9/26: 18.09 sec; 01/24/18: 21.1s    Time  4    Period  Weeks    Status  Achieved      PT LONG TERM GOAL #3   Title  Pt's Berg Balance Test will improve to at least 42/56 to demonstrate decreased risk of falling    Baseline  23/56; 8/15: 33/56; 9/26: 40/56; 01/24/18: 48/56    Time  4    Period  Weeks    Status  Achieved      PT LONG TERM GOAL #4   Title  Pt's 42mT (with SPC) will improve to at least 0.6 m/s to demonstrate improved gait speed in the home in community    Baseline  0.26 m/s; 8/15: .42 m/s with AD; 9/26: 0.54 m/s; 01/24/18: self-selected: 35.0s = 0.29 m/s, fastest: 21.7s=0.46 m/s    Time  4    Period  Weeks    Status  Partially Met      PT LONG TERM GOAL #5   Title  Pt will demonstrate ability to perform sit<>stand x1 without using UE support for improved QOL    Baseline  Pt requires at least 1UE support; 01/24/18: Able to perform without UE support    Time  4    Period  Weeks    Status  Achieved      Additional Long Term Goals   Additional Long Term Goals  Yes      PT LONG TERM GOAL #6   Title  Pt will improve BERG by at least 3 points in order to demonstrate clinically significant improvement in balance.      Baseline  23/56; 8/15: 33/56; 9/26: 40/56;  01/24/18: 48/56    Time  6    Period  Weeks    Status  New    Target Date  03/07/18            Plan - 02/09/18 1530    Clinical Impression Statement  Patient demonstrates improved stability and strength allowing patient to perform short duration standing interventions with rest periods.  Patient performs beginning standing dynamic standing balance exercises to shift weight and perform single leg standing activities with mod assist. Patient fatigues quickly with exercises requiring rest breaks at this time. Patient will continue to benefit from skilled physical therapy to improve pain and mobility.    Rehab Potential  Good    PT Frequency  2x / week    PT Duration  6 weeks    PT Treatment/Interventions  ADLs/Self Care Home Management;Aquatic Therapy;Cryotherapy;Electrical Stimulation;Iontophoresis 466mml Dexamethasone;Moist Heat;Traction;Ultrasound;DME Instruction;Gait training;Stair training;Functional mobility training;Therapeutic activities;Therapeutic exercise;Balance training;Neuromuscular re-education;Patient/family education;Orthotic Fit/Training;Wheelchair mobility training;Manual techniques;Compression bandaging;Passive range of motion;Dry needling;Energy conservation;Taping    PT Next Visit Plan  Progress HEP as needed and continue with strength and balance training, gait training  PT Home Exercise Plan  seated marching, LAQ    Consulted and Agree with Plan of Care  Patient       Patient will benefit from skilled therapeutic intervention in order to improve the following deficits and impairments:  Abnormal gait, Decreased activity tolerance, Decreased balance, Decreased endurance, Decreased knowledge of use of DME, Decreased mobility, Decreased range of motion, Decreased safety awareness, Decreased strength, Difficulty walking, Hypomobility, Increased fascial restricitons, Impaired perceived functional ability, Impaired flexibility, Increased muscle spasms, Impaired sensation,  Improper body mechanics, Postural dysfunction, Pain  Visit Diagnosis: Unsteadiness on feet  Muscle weakness (generalized)  Other abnormalities of gait and mobility     Problem List Patient Active Problem List   Diagnosis Date Noted  . Malnutrition of moderate degree 03/31/2017  . Acute respiratory failure with hypoxia ([place]) 03/30/2017  . AKI (acute kidney injury) ([place]) 07/15/2016  . Protein-calorie malnutrition, severe 02/08/2016  . Primary cancer of right upper lobe of lung ([place]) 11/20/2015  . Abnormal CT lung screening 10/17/2015  . Personal history of tobacco use, presenting hazards to health 10/15/2015  . Chronic vulvitis 09/26/2014  . Allergic reaction 09/26/2014  . Carotid stenosis 08/30/2014  . Cervical nerve root disorder 08/10/2014  . CAD in native artery 08/10/2014  . B12 deficiency 08/10/2014  . Back ache 08/10/2014  . Bronchitis, chronic ([place]) 08/10/2014  . Diabetes mellitus with polyneuropathy ([place]) 08/10/2014  . Can't get food down 08/10/2014  . Eczema of external ear 08/10/2014  . Accumulation of fluid in tissues 08/10/2014  . Gout 08/10/2014  . Adult hypothyroidism 08/10/2014  . Mononeuritis 08/10/2014  . Muscle ache 08/10/2014  . Disorder of peripheral nervous system 08/10/2014  . Lesion of vulva 08/10/2014  . Cervical spondylosis with radiculopathy 10/19/2013  . COPD exacerbation ([place]) 03/29/2013  . CAD (coronary artery disease) 06/22/2011  . COPD (chronic obstructive pulmonary disease) with emphysema ([place]) 03/07/2010  . CHEST PAIN UNSPECIFIED 07/22/2009  . HLD (hyperlipidemia) 04/01/2009  . Malaise and fatigue 04/01/2009  . Hyperlipidemia 01/18/2009  . TOBACCO ABUSE 01/18/2009  . HYPERTENSION, BENIGN 01/18/2009  . CLAUDICATION 01/18/2009  . Pain in limb 01/18/2009  . CAFL (chronic airflow limitation) ([place]) 01/20/2007  . Late effects of cerebrovascular disease 01/10/2007  . Essential (primary) hypertension 12/22/2006    Alanson Puls,  [place] DPT 02/09/2018, 3:34 PM  [place] MAIN [facility] SERVICES [address] [place], [place], [address] Phone: [phone]   Fax:  [phone]  Name: [name] [name] MRN: [id number] Date of Birth: [birth year]

## 2018-02-14 ENCOUNTER — Ambulatory Visit: Payer: Medicare Other | Admitting: Physical Therapy

## 2018-02-22 DIAGNOSIS — H353221 Exudative age-related macular degeneration, left eye, with active choroidal neovascularization: Secondary | ICD-10-CM | POA: Diagnosis not present

## 2018-02-23 DIAGNOSIS — R0602 Shortness of breath: Secondary | ICD-10-CM | POA: Diagnosis not present

## 2018-02-23 DIAGNOSIS — J449 Chronic obstructive pulmonary disease, unspecified: Secondary | ICD-10-CM | POA: Diagnosis not present

## 2018-02-24 ENCOUNTER — Ambulatory Visit: Payer: Medicare Other | Admitting: Physical Therapy

## 2018-02-25 DIAGNOSIS — J449 Chronic obstructive pulmonary disease, unspecified: Secondary | ICD-10-CM | POA: Diagnosis not present

## 2018-02-28 ENCOUNTER — Ambulatory Visit: Payer: Medicare Other

## 2018-02-28 VITALS — BP 146/68 | HR 70

## 2018-02-28 DIAGNOSIS — R2681 Unsteadiness on feet: Secondary | ICD-10-CM

## 2018-02-28 DIAGNOSIS — M6281 Muscle weakness (generalized): Secondary | ICD-10-CM | POA: Diagnosis not present

## 2018-02-28 DIAGNOSIS — R2689 Other abnormalities of gait and mobility: Secondary | ICD-10-CM

## 2018-02-28 NOTE — Therapy (Signed)
[place] MAIN [facility] SERVICES [address] [place], [place], [address] Phone: [phone]   Fax:  [phone]  Physical Therapy Treatment/Progress Note/Recertification  Dates of reporting period  01/24/18   to   02/28/18  Patient Details  Name: [name] [name] MRN: [id number] Date of Birth: 1941-03-21 Referring Provider (PT): Jerrol Banana, MD   Encounter Date: 02/28/2018  PT End of Session - 02/28/18 1357    Visit Number  29    Number of Visits  11    Date for PT Re-Evaluation  05/23/18    Authorization Type  3/10 progress note,    Authorization Time Period  Last goals: 02/28/18    PT Start Time  1347    PT Stop Time  1430    PT Time Calculation (min)  43 min    Equipment Utilized During Treatment  Gait belt    Activity Tolerance  Patient tolerated treatment well    Behavior During Therapy  WFL for tasks assessed/performed       Past Medical History:  Diagnosis Date  . Abnormal CT lung screening 10/17/2015  . COPD (chronic obstructive pulmonary disease) ([place])   . Coronary artery disease, non-occlusive    a. cath 2006: min nonobs CAD; b. cath 12/2010: cath LAD 50%, RCA 60%; c. 08/2013: Minimal luminal irregs, right dominant system with no significant CAD, diffuse luminal irregs noted. Normal EF 55%, no AS or MS.   . Diabetes mellitus   . Hyperlipemia    Followed by Dr. Rosanna Randy  . Hypertension   . Lung cancer ([place])   . Macular degeneration    rt  . Personal history of tobacco use, presenting hazards to health 10/15/2015  . Pneumonia    hx  . Shortness of breath   . Stroke [facility])     Past Surgical History:  Procedure Laterality Date  . ABDOMINAL HYSTERECTOMY    . ANTERIOR CERVICAL DECOMP/DISCECTOMY FUSION N/A 10/19/2013   Procedure: CERVICAL FIVE-SIX ANTERIOR CERVICAL DECOMPRESSION WITH FUSION INTERBODY PROSTHESIS PLATING AND PEEK CAGE;  Surgeon: Ophelia Charter, MD;  Location: [facility];  Service: Neurosurgery;   Laterality: N/A;  . BACK SURGERY  80's  . BREAST CYST EXCISION Left    left negative   . CARDIAC CATHETERIZATION  05/2004  . CATARACT EXTRACTION Left   . CHOLECYSTECTOMY    . ENDARTERECTOMY Left 10/24/2014   Procedure: ENDARTERECTOMY CAROTID;  Surgeon: Algernon Huxley, MD;  Location: [facility] ORS;  Service: Vascular;  Laterality: Left;  . ENDOBRONCHIAL ULTRASOUND N/A 11/14/2015   Procedure: ENDOBRONCHIAL ULTRASOUND;  Surgeon: [name], MD;  Location: [facility] ORS;  Service: Cardiopulmonary;  Laterality: N/A;  . PERIPHERAL VASCULAR CATHETERIZATION N/A 12/04/2015   Procedure: Glori Luis Cath Insertion;  Surgeon: Algernon Huxley, MD;  Location: [place] CV LAB;  Service: Cardiovascular;  Laterality: N/A;  . PORTA CATH REMOVAL N/A 05/17/2017   Procedure: PORTA CATH REMOVAL;  Surgeon: Algernon Huxley, MD;  Location: [place] CV LAB;  Service: Cardiovascular;  Laterality: N/A;  . TONSILLECTOMY AND ADENOIDECTOMY    . VESICOVAGINAL FISTULA CLOSURE W/ TAH      Vitals:   02/28/18 1353  BP: (!) 146/68  Pulse: 70  SpO2: 99%    Subjective Assessment - 02/28/18 1350    Subjective  Patient reports alright today. On 02/15/18 she was visiting her son in [place] and when getting out of the car she missed her step and fell into a wall. She struck her  head on the wall and reports that she "saw stars" and "blacked out." She fell down to the ground. Pt regained consciousness but felt confused for the remainder of the day. By the next day her confusion had resolved. She saw her son's PCP who performed plain film radiographs which according to patient were WNL. Pt denies any further imaging. She reports that she had a big "goose egg" on her head. She complains of 8/10 L posterior headache today where she struck her head. Headache has persisted for the last 2 weeks since the injury. She denies any neurological symptoms including dysphagia, dysarthria, focal numbness/tingling, focal weakness, or blurred vision.     Pertinent  History  Pt presents with reports of imbalance and BLE weakness. Pt reports that she had HHPT ~1 year ago and since has become more weak.  Pt reports 6 falls in the past 6 months.  Pt reports she loses her balance due to her RLE giving way, causing her to fall.  Pt with h/o stroke but denies it affecting one side more than the other.  Pt with h/o lung cancer with last treatment ~2 years ago.  Pt reports 8/10 LBP which she attributes to falls in the past.  Pt is able to ascend/descend steps but this is challenging.   Pt has a ramp but she still goes up the steps.  Husband does the driving, grocery shopping.  Pt helps with cooking and cleaning.  Pt independent with showering and dressing.  Pt ambulates with her SPC at all times.  Pt checks her BP, pulse, O2, blood glucose each day.  She also checks the bottom of her feet 1x/wk due to her peripheral neuropathy (addressed this session to check 1x/day).     How long can you sit comfortably?  no issues    How long can you stand comfortably?  10 minutes    How long can you walk comfortably?  household distances    Patient Stated Goals  Pt would like to be able to walk farther, no falls, up and down steps    Currently in Pain?  Yes    Pain Score  8     Pain Location  Head    Pain Orientation  Left;Posterior    Pain Descriptors / Indicators  Headache    Pain Type  Acute pain    Pain Onset  1 to 4 weeks ago    Multiple Pain Sites  No    Pain Onset  --         [facility] PT Assessment - 02/28/18 1413      Observation/Other Assessments   Other Surveys   Other Surveys    Activities of Balance Confidence Scale (ABC Scale)   25.63%      Standardized Balance Assessment   Standardized Balance Assessment  Berg Balance Test;Timed Up and Go Test;10 meter walk test    10 Meter Walk  self-selected: 32.0s = 0.31 m/s, fastest: 16.8s=0.60 m/s      Berg Balance Test   Sit to Stand  Able to stand without using hands and stabilize independently    Standing Unsupported   Able to stand safely 2 minutes    Sitting with Back Unsupported but Feet Supported on Floor or Stool  Able to sit safely and securely 2 minutes    Stand to Sit  Controls descent by using hands    Transfers  Able to transfer safely, minor use of hands    Standing Unsupported with Eyes  Closed  Able to stand 10 seconds with supervision    Standing Ubsupported with Feet Together  Able to place feet together independently and stand for 1 minute with supervision    From Standing, Reach Forward with Outstretched Arm  Can reach forward >12 cm safely (5")    From Standing Position, Pick up Object from [place] to pick up shoe safely and easily    From Standing Position, Turn to Look Behind Over each Shoulder  Looks behind one side only/other side shows less weight shift    Turn 360 Degrees  Able to turn 360 degrees safely one side only in 4 seconds or less    Standing Unsupported, Alternately Place Feet on Step/Stool  Needs assistance to keep from falling or unable to try    Standing Unsupported, One Foot in [place] to take small step independently and hold 30 seconds    Standing on One Leg  Tries to lift leg/unable to hold 3 seconds but remains standing independently    Total Score  41      Timed Up and Go Test   TUG  Normal TUG    Normal TUG (seconds)  18.7         TREATMENT   Ther-ex  NuStep L2 x 4 minutes for warm-up during history; Pt completed ABC=25.63% (unbilled); Performed TUG (18.7s) 42m gait speed (self-selected: 32.0s = 0.31 m/s, fastest: 16.8s=0.60 m/s) and BERG (41/56) with patient; Reviewed outcome measures, updated goals, and discussed plan of care with patient; Standing hip abd with 2# ankle weight x 10 BLE and  BUE support  Standing hip extension with 2# ankle weight x 10 BLE with BUE support  Standing hip flexion marches with 2# ankle weight x 10 BLE with BUE support  Standing HS curls with 2# ankle weight x 10   Pt educated throughout session about proper  posture and technique with exercises. Improved exercise technique, movement at target joints, use of target muscles after min to mod verbal, visual, tactile cues.     Patient reports that 02/15/18 she was visiting her son in [place] and when getting out of the car she missed her step and fell into a wall. She struck her head on the wall and reports that she "saw stars" and "blacked out." She fell down to the ground. Pt regained consciousness but felt confused for the remainder of the day. By the next day her confusion had resolved. She saw her son's PCP who performed plain film radiographs which according to patient were WNL. Pt denies any further imaging. She has had persistent headaches since that time but denies any other neurological symptoms. Outcome measures repeated today and pt demonstrates improvement in her 67m gait speed and her TUG. However since the fall her ABC score has decreased considerably and pt states that it really affected her confidence. Her BERG score today is actually lower as well at 41/56. We will continue to see patient to work on her balance and strength. Pt will benefit from continued skilled PT interventions for improved independence with functional activities.                     PT Short Term Goals - 02/28/18 1359      PT SHORT TERM GOAL #1   Title  Pt will independently complete HEP at least 4 days/wk for improved carryover between sessions    Baseline  reports compliance with HEP, 01/24/18: performing HEP daily  Time  2    Period  Weeks    Status  Achieved      PT SHORT TERM GOAL #2   Title  Pt will inspect her feet at least 1x/day for proper foot care due to peripheral neuropathy    Baseline  increasing frequency; 01/24/18: performs daily; 02/28/18: performs daily    Time  2    Period  Weeks    Status  Achieved        PT Long Term Goals - 02/28/18 1359      PT LONG TERM GOAL #1   Title  Pt's ABC scale will improve to at least 50%  to demonstrate decreased perception of imbalance    Baseline  20%; 8/15: 45%; 9/26: 57.5%; 01/24/18: 55%; 02/28/18: 25.63%    Time  8    Period  Weeks    Status  On-going    Target Date  04/25/18      PT LONG TERM GOAL #2   Title  Pt's TUG time (with SPC) will decrease to less than or equal to 20 seconds to demonstrate improved mobility    Baseline  32 seconds; 8/15: 31 seconds; 9/26: 18.09 sec; 01/24/18: 21.1s; 02/28/18: 18.7s    Time  8    Period  Weeks    Status  Achieved    Target Date  01/21/18      PT LONG TERM GOAL #3   Title  Pt's Berg Balance Test will improve to at least 42/56 to demonstrate decreased risk of falling    Baseline  23/56; 8/15: 33/56; 9/26: 40/56; 01/24/18: 48/56; 02/28/18: 41/56    Time  8    Period  Weeks    Status  Achieved    Target Date  04/25/18      PT LONG TERM GOAL #4   Title  Pt's 61mWT (with SPC) will improve to at least 0.6 m/s to demonstrate improved gait speed in the home in community    Baseline  0.26 m/s; 8/15: .42 m/s with AD; 9/26: 0.54 m/s; 01/24/18: self-selected: 35.0s = 0.29 m/s, fastest: 21.7s=0.46 m/s; 02/28/18: self-selected: 32.0s = 0.31 m/s, fastest: 16.8s=0.60 m/s    Time  8    Period  Weeks    Status  Achieved      PT LONG TERM GOAL #5   Title  Pt will demonstrate ability to perform sit<>stand x1 without using UE support for improved QOL    Baseline  Pt requires at least 1UE support; 01/24/18: Able to perform without UE support    Time  4    Period  Weeks    Status  Achieved      PT LONG TERM GOAL #6   Title  Pt will improve BERG by at least 3 points (51/56) in order to demonstrate clinically significant improvement in balance.      Baseline  23/56; 8/15: 33/56; 9/26: 40/56; 01/24/18: 48/56; 02/28/18: 41/56    Time  6    Period  Weeks    Status  On-going    Target Date  04/25/18            Plan - 02/28/18 1357    Clinical Impression Statement  Patient reports that 02/15/18 she was visiting her son in [place]  and when getting out of the car she missed her step and fell into a wall. She struck her head on the wall and reports that she "saw stars" and "blacked out." She fell down to the ground.  Pt regained consciousness but felt confused for the remainder of the day. By the next day her confusion had resolved. She saw her son's PCP who performed plain film radiographs which according to patient were WNL. Pt denies any further imaging. She has had persistent headaches since that time but denies any other neurological symptoms. Outcome measures repeated today and pt demonstrates improvement in her 72m gait speed and her TUG. However since the fall her ABC score has decreased considerably and pt states that it really affected her confidence. Her BERG score today is actually lower as well at 41/56. We will continue to see patient to work on her balance and strength. Pt will benefit from continued skilled PT interventions for improved independence with functional activities.    Rehab Potential  Good    PT Frequency  2x / week    PT Duration  6 weeks    PT Treatment/Interventions  ADLs/Self Care Home Management;Aquatic Therapy;Cryotherapy;Electrical Stimulation;Iontophoresis 4mg /ml Dexamethasone;Moist Heat;Traction;Ultrasound;DME Instruction;Gait training;Stair training;Functional mobility training;Therapeutic activities;Therapeutic exercise;Balance training;Neuromuscular re-education;Patient/family education;Orthotic Fit/Training;Wheelchair mobility training;Manual techniques;Compression bandaging;Passive range of motion;Dry needling;Energy conservation;Taping    PT Next Visit Plan  Progress HEP as needed and continue with strength and balance training, gait training    PT Home Exercise Plan  seated marching, LAQ    Consulted and Agree with Plan of Care  Patient       Patient will benefit from skilled therapeutic intervention in order to improve the following deficits and impairments:  Abnormal gait, Decreased  activity tolerance, Decreased balance, Decreased endurance, Decreased knowledge of use of DME, Decreased mobility, Decreased range of motion, Decreased safety awareness, Decreased strength, Difficulty walking, Hypomobility, Increased fascial restricitons, Impaired perceived functional ability, Impaired flexibility, Increased muscle spasms, Impaired sensation, Improper body mechanics, Postural dysfunction, Pain  Visit Diagnosis: Unsteadiness on feet  Muscle weakness (generalized)  Other abnormalities of gait and mobility     Problem List Patient Active Problem List   Diagnosis Date Noted  . Malnutrition of moderate degree 03/31/2017  . Acute respiratory failure with hypoxia ([place]) 03/30/2017  . AKI (acute kidney injury) ([place]) 07/15/2016  . Protein-calorie malnutrition, severe 02/08/2016  . Primary cancer of right upper lobe of lung ([place]) 11/20/2015  . Abnormal CT lung screening 10/17/2015  . Personal history of tobacco use, presenting hazards to health 10/15/2015  . Chronic vulvitis 09/26/2014  . Allergic reaction 09/26/2014  . Carotid stenosis 08/30/2014  . Cervical nerve root disorder 08/10/2014  . CAD in native artery 08/10/2014  . B12 deficiency 08/10/2014  . Back ache 08/10/2014  . Bronchitis, chronic ([place]) 08/10/2014  . Diabetes mellitus with polyneuropathy ([place]) 08/10/2014  . Can't get food down 08/10/2014  . Eczema of external ear 08/10/2014  . Accumulation of fluid in tissues 08/10/2014  . Gout 08/10/2014  . Adult hypothyroidism 08/10/2014  . Mononeuritis 08/10/2014  . Muscle ache 08/10/2014  . Disorder of peripheral nervous system 08/10/2014  . Lesion of vulva 08/10/2014  . Cervical spondylosis with radiculopathy 10/19/2013  . COPD exacerbation ([place]) 03/29/2013  . CAD (coronary artery disease) 06/22/2011  . COPD (chronic obstructive pulmonary disease) with emphysema ([place]) 03/07/2010  . CHEST PAIN UNSPECIFIED 07/22/2009  . HLD (hyperlipidemia) 04/01/2009  .  Malaise and fatigue 04/01/2009  . Hyperlipidemia 01/18/2009  . TOBACCO ABUSE 01/18/2009  . HYPERTENSION, BENIGN 01/18/2009  . CLAUDICATION 01/18/2009  . Pain in limb 01/18/2009  . CAFL (chronic airflow limitation) ([place]) 01/20/2007  . Late effects of cerebrovascular disease 01/10/2007  . Essential (primary) hypertension 12/22/2006  Phillips Grout PT, DPT, GCS  Huprich,Jason 03/01/2018, 1:00 PM  [place] MAIN [facility] SERVICES [address] [place], [place], [address] Phone: [phone]   Fax:  [phone]  Name: [name] [name] MRN: [id number] Date of Birth: 02-04-1941

## 2018-03-02 ENCOUNTER — Telehealth: Payer: Self-pay | Admitting: Family Medicine

## 2018-03-02 ENCOUNTER — Ambulatory Visit: Payer: Medicare Other

## 2018-03-02 DIAGNOSIS — M6281 Muscle weakness (generalized): Secondary | ICD-10-CM | POA: Diagnosis not present

## 2018-03-02 DIAGNOSIS — R2681 Unsteadiness on feet: Secondary | ICD-10-CM

## 2018-03-02 DIAGNOSIS — R2689 Other abnormalities of gait and mobility: Secondary | ICD-10-CM | POA: Diagnosis not present

## 2018-03-02 NOTE — Therapy (Signed)
[place] MAIN [facility] SERVICES [address] [place], [place], [address] Phone: [phone]   Fax:  [phone]  Physical Therapy Treatment  Patient Details  Name: [name] [name] MRN: [id number] Date of Birth: Jul 18, 1940 Referring Provider (PT): Jerrol Banana, MD   Encounter Date: 03/02/2018  PT End of Session - 03/02/18 1627    Visit Number  30    Number of Visits  18    Date for PT Re-Evaluation  05/23/18    Authorization Type  4/10 progress note,    Authorization Time Period  Last goals: 02/28/18    PT Start Time  1610    PT Stop Time  1655    PT Time Calculation (min)  45 min    Equipment Utilized During Treatment  Gait belt    Activity Tolerance  Patient tolerated treatment well    Behavior During Therapy  [facility] for tasks assessed/performed       Past Medical History:  Diagnosis Date  . Abnormal CT lung screening 10/17/2015  . COPD (chronic obstructive pulmonary disease) ([place])   . Coronary artery disease, non-occlusive    a. cath 2006: min nonobs CAD; b. cath 12/2010: cath LAD 50%, RCA 60%; c. 08/2013: Minimal luminal irregs, right dominant system with no significant CAD, diffuse luminal irregs noted. Normal EF 55%, no AS or MS.   . Diabetes mellitus   . Hyperlipemia    Followed by Dr. Rosanna Randy  . Hypertension   . Lung cancer ([place])   . Macular degeneration    rt  . Personal history of tobacco use, presenting hazards to health 10/15/2015  . Pneumonia    hx  . Shortness of breath   . Stroke [facility])     Past Surgical History:  Procedure Laterality Date  . ABDOMINAL HYSTERECTOMY    . ANTERIOR CERVICAL DECOMP/DISCECTOMY FUSION N/A 10/19/2013   Procedure: CERVICAL FIVE-SIX ANTERIOR CERVICAL DECOMPRESSION WITH FUSION INTERBODY PROSTHESIS PLATING AND PEEK CAGE;  Surgeon: Ophelia Charter, MD;  Location: [place] NEURO ORS;  Service: Neurosurgery;  Laterality: N/A;  . BACK SURGERY  80's  . BREAST CYST EXCISION Left    left negative    . CARDIAC CATHETERIZATION  05/2004  . CATARACT EXTRACTION Left   . CHOLECYSTECTOMY    . ENDARTERECTOMY Left 10/24/2014   Procedure: ENDARTERECTOMY CAROTID;  Surgeon: Algernon Huxley, MD;  Location: [facility] ORS;  Service: Vascular;  Laterality: Left;  . ENDOBRONCHIAL ULTRASOUND N/A 11/14/2015   Procedure: ENDOBRONCHIAL ULTRASOUND;  Surgeon: [name], MD;  Location: [facility] ORS;  Service: Cardiopulmonary;  Laterality: N/A;  . PERIPHERAL VASCULAR CATHETERIZATION N/A 12/04/2015   Procedure: Glori Luis Cath Insertion;  Surgeon: Algernon Huxley, MD;  Location: [place] CV LAB;  Service: Cardiovascular;  Laterality: N/A;  . PORTA CATH REMOVAL N/A 05/17/2017   Procedure: PORTA CATH REMOVAL;  Surgeon: Algernon Huxley, MD;  Location: [place] CV LAB;  Service: Cardiovascular;  Laterality: N/A;  . TONSILLECTOMY AND ADENOIDECTOMY    . VESICOVAGINAL FISTULA CLOSURE W/ TAH      There were no vitals filed for this visit.  Subjective Assessment - 03/02/18 1625    Subjective  Pt states that she continues to have headaches since her fall. She currently rates her headache pain as a 4/10 currently. She fell today when trying to get into the car to come to therapy. He complains of very mild pain in L hip but has no difficulty walking or bearing weight. Otherwise no specific  questions or concerns.     Pertinent History  Pt presents with reports of imbalance and BLE weakness. Pt reports that she had HHPT ~1 year ago and since has become more weak.  Pt reports 6 falls in the past 6 months.  Pt reports she loses her balance due to her RLE giving way, causing her to fall.  Pt with h/o stroke but denies it affecting one side more than the other.  Pt with h/o lung cancer with last treatment ~2 years ago.  Pt reports 8/10 LBP which she attributes to falls in the past.  Pt is able to ascend/descend steps but this is challenging.   Pt has a ramp but she still goes up the steps.  Husband does the driving, grocery shopping.  Pt helps with  cooking and cleaning.  Pt independent with showering and dressing.  Pt ambulates with her SPC at all times.  Pt checks her BP, pulse, O2, blood glucose each day.  She also checks the bottom of her feet 1x/wk due to her peripheral neuropathy (addressed this session to check 1x/day).     How long can you sit comfortably?  no issues    How long can you stand comfortably?  10 minutes    How long can you walk comfortably?  household distances    Patient Stated Goals  Pt would like to be able to walk farther, no falls, up and down steps    Currently in Pain?  Yes    Pain Score  4     Pain Location  Head    Pain Orientation  Left;Posterior    Pain Descriptors / Indicators  Headache    Pain Onset  1 to 4 weeks ago    Pain Frequency  Constant    Multiple Pain Sites  Yes    Pain Score  8    Pain Location  Hip    Pain Orientation  Left    Pain Descriptors / Indicators  Burning    Pain Type  Acute pain         TREATMENT   Ther-ex  NuStep L2 x 5 minutes for warm-up during history; Quantum single leg press 60# 2 x 20 bilateral; Seated marches with gentle manual resistance 2 x 10; Seated clams with gentle manual resistance 2 x 10; Seated adductor squeeze with gentle manual resistance 2 x 10; Sit to stand without UE support from elevated mat table 2 x 10;   Neuromuscular Re-education  Toe taps to 6" step without UE support alternating LE x 10 each without UE support  Airex balance with feet together eyes open/closed x 30s each; NBOS with ball passes around body bilateral x multiple bouts each with min/modA correction    Pt educated throughout session about proper posture and technique with exercises. Improved exercise technique, movement at target joints, use of target muscles after min to mod verbal, visual, tactile cues.     Patient came to the clinic thinking she had an appointment even though there was nothing scheduled. Fortunately due to a gap in the schedule therapist was able  to work her in. Pt with worsening mobility since her fall at her sons house. Her gait speed is decreased and she seems more timid. She suffered a fall today when trying to get into the car to to come to therapy. She reports a little bit of L hip pain but has no difficulty walking or accepting weight on her LLE. Pt encouraged to contact MD  if she starts to have pain or difficulty with walking. Patient will continue therapy to work on her balance and strength. Pt will benefit from continued skilled PT interventions for improved independence with functional activities.                         PT Short Term Goals - 02/28/18 1359      PT SHORT TERM GOAL #1   Title  Pt will independently complete HEP at least 4 days/wk for improved carryover between sessions    Baseline  reports compliance with HEP, 01/24/18: performing HEP daily    Time  2    Period  Weeks    Status  Achieved      PT SHORT TERM GOAL #2   Title  Pt will inspect her feet at least 1x/day for proper foot care due to peripheral neuropathy    Baseline  increasing frequency; 01/24/18: performs daily; 02/28/18: performs daily    Time  2    Period  Weeks    Status  Achieved        PT Long Term Goals - 02/28/18 1359      PT LONG TERM GOAL #1   Title  Pt's ABC scale will improve to at least 50% to demonstrate decreased perception of imbalance    Baseline  20%; 8/15: 45%; 9/26: 57.5%; 01/24/18: 55%; 02/28/18: 25.63%    Time  8    Period  Weeks    Status  On-going    Target Date  04/25/18      PT LONG TERM GOAL #2   Title  Pt's TUG time (with SPC) will decrease to less than or equal to 20 seconds to demonstrate improved mobility    Baseline  32 seconds; 8/15: 31 seconds; 9/26: 18.09 sec; 01/24/18: 21.1s; 02/28/18: 18.7s    Time  8    Period  Weeks    Status  Achieved    Target Date  01/21/18      PT LONG TERM GOAL #3   Title  Pt's Berg Balance Test will improve to at least 42/56 to demonstrate decreased  risk of falling    Baseline  23/56; 8/15: 33/56; 9/26: 40/56; 01/24/18: 48/56; 02/28/18: 41/56    Time  8    Period  Weeks    Status  Achieved    Target Date  04/25/18      PT LONG TERM GOAL #4   Title  Pt's 20mWT (with SPC) will improve to at least 0.6 m/s to demonstrate improved gait speed in the home in community    Baseline  0.26 m/s; 8/15: .42 m/s with AD; 9/26: 0.54 m/s; 01/24/18: self-selected: 35.0s = 0.29 m/s, fastest: 21.7s=0.46 m/s; 02/28/18: self-selected: 32.0s = 0.31 m/s, fastest: 16.8s=0.60 m/s    Time  8    Period  Weeks    Status  Achieved      PT LONG TERM GOAL #5   Title  Pt will demonstrate ability to perform sit<>stand x1 without using UE support for improved QOL    Baseline  Pt requires at least 1UE support; 01/24/18: Able to perform without UE support    Time  4    Period  Weeks    Status  Achieved      PT LONG TERM GOAL #6   Title  Pt will improve BERG by at least 3 points (51/56) in order to demonstrate clinically significant improvement in balance.  Baseline  23/56; 8/15: 33/56; 9/26: 40/56; 01/24/18: 48/56; 02/28/18: 41/56    Time  6    Period  Weeks    Status  On-going    Target Date  04/25/18            Plan - 03/02/18 1632    Clinical Impression Statement  Patient came to the clinic thinking she had an appointment even though there was nothing scheduled. Fortunately due to a gap in the schedule therapist was able to work her in. Pt with worsening mobility since her fall at her sons house. Her gait speed is decreased and she seems more timid. She suffered a fall today when trying to get into the car to to come to therapy. She reports a little bit of L hip pain but has no difficulty walking or accepting weight on her LLE. Pt encouraged to contact MD if she starts to have pain or difficulty with walking. Patient will continue therapy to work on her balance and strength. Pt will benefit from continued skilled PT interventions for improved  independence with functional activities.    Rehab Potential  Good    PT Frequency  2x / week    PT Duration  6 weeks    PT Treatment/Interventions  ADLs/Self Care Home Management;Aquatic Therapy;Cryotherapy;Electrical Stimulation;Iontophoresis 4mg /ml Dexamethasone;Moist Heat;Traction;Ultrasound;DME Instruction;Gait training;Stair training;Functional mobility training;Therapeutic activities;Therapeutic exercise;Balance training;Neuromuscular re-education;Patient/family education;Orthotic Fit/Training;Wheelchair mobility training;Manual techniques;Compression bandaging;Passive range of motion;Dry needling;Energy conservation;Taping    PT Next Visit Plan  Progress HEP as needed and continue with strength and balance training, gait training    PT Home Exercise Plan  seated marching, LAQ    Consulted and Agree with Plan of Care  Patient       Patient will benefit from skilled therapeutic intervention in order to improve the following deficits and impairments:  Abnormal gait, Decreased activity tolerance, Decreased balance, Decreased endurance, Decreased knowledge of use of DME, Decreased mobility, Decreased range of motion, Decreased safety awareness, Decreased strength, Difficulty walking, Hypomobility, Increased fascial restricitons, Impaired perceived functional ability, Impaired flexibility, Increased muscle spasms, Impaired sensation, Improper body mechanics, Postural dysfunction, Pain  Visit Diagnosis: Unsteadiness on feet  Muscle weakness (generalized)     Problem List Patient Active Problem List   Diagnosis Date Noted  . Malnutrition of moderate degree 03/31/2017  . Acute respiratory failure with hypoxia ([place]) 03/30/2017  . AKI (acute kidney injury) ([place]) 07/15/2016  . Protein-calorie malnutrition, severe 02/08/2016  . Primary cancer of right upper lobe of lung ([place]) 11/20/2015  . Abnormal CT lung screening 10/17/2015  . Personal history of tobacco use, presenting hazards to health  10/15/2015  . Chronic vulvitis 09/26/2014  . Allergic reaction 09/26/2014  . Carotid stenosis 08/30/2014  . Cervical nerve root disorder 08/10/2014  . CAD in native artery 08/10/2014  . B12 deficiency 08/10/2014  . Back ache 08/10/2014  . Bronchitis, chronic ([place]) 08/10/2014  . Diabetes mellitus with polyneuropathy ([place]) 08/10/2014  . Can't get food down 08/10/2014  . Eczema of external ear 08/10/2014  . Accumulation of fluid in tissues 08/10/2014  . Gout 08/10/2014  . Adult hypothyroidism 08/10/2014  . Mononeuritis 08/10/2014  . Muscle ache 08/10/2014  . Disorder of peripheral nervous system 08/10/2014  . Lesion of vulva 08/10/2014  . Cervical spondylosis with radiculopathy 10/19/2013  . COPD exacerbation ([place]) 03/29/2013  . CAD (coronary artery disease) 06/22/2011  . COPD (chronic obstructive pulmonary disease) with emphysema ([place]) 03/07/2010  . CHEST PAIN UNSPECIFIED 07/22/2009  . HLD (hyperlipidemia) 04/01/2009  .  Malaise and fatigue 04/01/2009  . Hyperlipidemia 01/18/2009  . TOBACCO ABUSE 01/18/2009  . HYPERTENSION, BENIGN 01/18/2009  . CLAUDICATION 01/18/2009  . Pain in limb 01/18/2009  . CAFL (chronic airflow limitation) ([place]) 01/20/2007  . Late effects of cerebrovascular disease 01/10/2007  . Essential (primary) hypertension 12/22/2006   Phillips Grout PT, DPT, GCS  Torin Modica 03/02/2018, 5:02 PM  [place] MAIN [facility] SERVICES [address] [place], [place], [address] Phone: [phone]   Fax:  [phone]  Name: [name] [name] MRN: [id number] Date of Birth: 1940/09/09

## 2018-03-02 NOTE — Telephone Encounter (Signed)
Advised husband as below.

## 2018-03-02 NOTE — Telephone Encounter (Signed)
Patient's husband called stating that Dr. Rosanna Randy had taken her off the Lipitor to see if this would help her leg pain.  She has been off this for a "couple of months" and has seen no improvement.   Does she need to start back on it?

## 2018-03-02 NOTE — Telephone Encounter (Signed)
Please review. Thanks!  

## 2018-03-02 NOTE — Telephone Encounter (Signed)
yes

## 2018-03-09 ENCOUNTER — Ambulatory Visit: Payer: Medicare Other | Attending: Family Medicine | Admitting: Physical Therapy

## 2018-03-09 ENCOUNTER — Encounter: Payer: Self-pay | Admitting: Physical Therapy

## 2018-03-09 DIAGNOSIS — R2681 Unsteadiness on feet: Secondary | ICD-10-CM | POA: Insufficient documentation

## 2018-03-09 DIAGNOSIS — R2689 Other abnormalities of gait and mobility: Secondary | ICD-10-CM | POA: Insufficient documentation

## 2018-03-09 DIAGNOSIS — M6281 Muscle weakness (generalized): Secondary | ICD-10-CM | POA: Insufficient documentation

## 2018-03-09 NOTE — Therapy (Addendum)
[place] MAIN [facility] SERVICES [address] [place], [place], [address] Phone: [phone]   Fax:  [phone]  Physical Therapy Treatment  Patient Details  Name: [name] [name] MRN: [id number] Date of Birth: 18-Sep-1940 Referring Provider (PT): Jerrol Banana, MD   Encounter Date: 03/09/2018  PT End of Session - 03/09/18 1311    Visit Number  30    Number of Visits  24    Date for PT Re-Evaluation  05/23/18    Authorization Type  4/10 progress note,    Authorization Time Period  Last goals: 02/28/18    PT Start Time  0105    PT Stop Time  0145    PT Time Calculation (min)  40 min    Equipment Utilized During Treatment  Gait belt    Activity Tolerance  Patient tolerated treatment well    Behavior During Therapy  [facility] for tasks assessed/performed       Past Medical History:  Diagnosis Date  . Abnormal CT lung screening 10/17/2015  . COPD (chronic obstructive pulmonary disease) ([place])   . Coronary artery disease, non-occlusive    a. cath 2006: min nonobs CAD; b. cath 12/2010: cath LAD 50%, RCA 60%; c. 08/2013: Minimal luminal irregs, right dominant system with no significant CAD, diffuse luminal irregs noted. Normal EF 55%, no AS or MS.   . Diabetes mellitus   . Hyperlipemia    Followed by Dr. Rosanna Randy  . Hypertension   . Lung cancer ([place])   . Macular degeneration    rt  . Personal history of tobacco use, presenting hazards to health 10/15/2015  . Pneumonia    hx  . Shortness of breath   . Stroke [facility])     Past Surgical History:  Procedure Laterality Date  . ABDOMINAL HYSTERECTOMY    . ANTERIOR CERVICAL DECOMP/DISCECTOMY FUSION N/A 10/19/2013   Procedure: CERVICAL FIVE-SIX ANTERIOR CERVICAL DECOMPRESSION WITH FUSION INTERBODY PROSTHESIS PLATING AND PEEK CAGE;  Surgeon: Ophelia Charter, MD;  Location: [place] NEURO ORS;  Service: Neurosurgery;  Laterality: N/A;  . BACK SURGERY  80's  . BREAST CYST EXCISION Left    left negative   .  CARDIAC CATHETERIZATION  05/2004  . CATARACT EXTRACTION Left   . CHOLECYSTECTOMY    . ENDARTERECTOMY Left 10/24/2014   Procedure: ENDARTERECTOMY CAROTID;  Surgeon: Algernon Huxley, MD;  Location: [facility] ORS;  Service: Vascular;  Laterality: Left;  . ENDOBRONCHIAL ULTRASOUND N/A 11/14/2015   Procedure: ENDOBRONCHIAL ULTRASOUND;  Surgeon: [name], MD;  Location: [facility] ORS;  Service: Cardiopulmonary;  Laterality: N/A;  . PERIPHERAL VASCULAR CATHETERIZATION N/A 12/04/2015   Procedure: Glori Luis Cath Insertion;  Surgeon: Algernon Huxley, MD;  Location: [employer] CV LAB;  Service: Cardiovascular;  Laterality: N/A;  . PORTA CATH REMOVAL N/A 05/17/2017   Procedure: PORTA CATH REMOVAL;  Surgeon: Algernon Huxley, MD;  Location: [place] CV LAB;  Service: Cardiovascular;  Laterality: N/A;  . TONSILLECTOMY AND ADENOIDECTOMY    . VESICOVAGINAL FISTULA CLOSURE W/ TAH      There were no vitals filed for this visit.  Subjective Assessment - 03/09/18 1309    Subjective  Pt states that she continues to have headaches since her fall. She  complains of very mild pain in L hip but has no difficulty walking or bearing weight. Otherwise no specific questions or concerns.     Pertinent History  Pt presents with reports of imbalance and BLE weakness. Pt reports that she  had HHPT ~1 year ago and since has become more weak.  Pt reports 6 falls in the past 6 months.  Pt reports she loses her balance due to her RLE giving way, causing her to fall.  Pt with h/o stroke but denies it affecting one side more than the other.  Pt with h/o lung cancer with last treatment ~2 years ago.  Pt reports 8/10 LBP which she attributes to falls in the past.  Pt is able to ascend/descend steps but this is challenging.   Pt has a ramp but she still goes up the steps.  Husband does the driving, grocery shopping.  Pt helps with cooking and cleaning.  Pt independent with showering and dressing.  Pt ambulates with her SPC at all times.  Pt checks her BP,  pulse, O2, blood glucose each day.  She also checks the bottom of her feet 1x/wk due to her peripheral neuropathy (addressed this session to check 1x/day).     How long can you sit comfortably?  no issues    How long can you stand comfortably?  10 minutes    How long can you walk comfortably?  household distances    Patient Stated Goals  Pt would like to be able to walk farther, no falls, up and down steps    Currently in Pain?  Yes    Pain Score  3     Pain Location  Hip    Pain Orientation  Left    Pain Descriptors / Indicators  Aching    Pain Type  Chronic pain    Pain Onset  1 to 4 weeks ago    Pain Onset  Yesterday       Ther-ex  Standing hip abd with 2# ankle weight x 15 BLE and  BUE support  Standing hip extension with 2# ankle weight x 10 BLE with BUE support  Standing hip flexion marches with 2# ankle weight x 10 BLE with BUE support  Leg press with 90 lbs x 10 x 2  STS from chair without UE support 2x10  Mini squats with 3 sec  x10 with  UE support    Neuromuscular Re-education  Toe taps to 6" step without UE support alternating LE x15 each x 2 Lateal toe taps to 1/2 foam  without UE support alternating LE x15 each x 2 Lunge to BOSU ball x 10 with UE support? Forward and backward stepping over hurdle x15 each direction , 50 % UW support  SIde stepping left and right in parallel bars  15x each direction  Patient has left hip pain that is worse with standing exercises and decreases with sitting exercises                      PT Education - 03/09/18 1310    Education Details  HEP    Person(s) Educated  Patient    Methods  Explanation    Comprehension  Verbalized understanding       PT Short Term Goals - 02/28/18 1359      PT SHORT TERM GOAL #1   Title  Pt will independently complete HEP at least 4 days/wk for improved carryover between sessions    Baseline  reports compliance with HEP, 01/24/18: performing HEP daily    Time  2    Period   Weeks    Status  Achieved      PT SHORT TERM GOAL #2   Title  Pt  will inspect her feet at least 1x/day for proper foot care due to peripheral neuropathy    Baseline  increasing frequency; 01/24/18: performs daily; 02/28/18: performs daily    Time  2    Period  Weeks    Status  Achieved        PT Long Term Goals - 02/28/18 1359      PT LONG TERM GOAL #1   Title  Pt's ABC scale will improve to at least 50% to demonstrate decreased perception of imbalance    Baseline  20%; 8/15: 45%; 9/26: 57.5%; 01/24/18: 55%; 02/28/18: 25.63%    Time  8    Period  Weeks    Status  On-going    Target Date  04/25/18      PT LONG TERM GOAL #2   Title  Pt's TUG time (with SPC) will decrease to less than or equal to 20 seconds to demonstrate improved mobility    Baseline  32 seconds; 8/15: 31 seconds; 9/26: 18.09 sec; 01/24/18: 21.1s; 02/28/18: 18.7s    Time  8    Period  Weeks    Status  Achieved    Target Date  01/21/18      PT LONG TERM GOAL #3   Title  Pt's Berg Balance Test will improve to at least 42/56 to demonstrate decreased risk of falling    Baseline  23/56; 8/15: 33/56; 9/26: 40/56; 01/24/18: 48/56; 02/28/18: 41/56    Time  8    Period  Weeks    Status  Achieved    Target Date  04/25/18      PT LONG TERM GOAL #4   Title  Pt's 64mWT (with SPC) will improve to at least 0.6 m/s to demonstrate improved gait speed in the home in community    Baseline  0.26 m/s; 8/15: .42 m/s with AD; 9/26: 0.54 m/s; 01/24/18: self-selected: 35.0s = 0.29 m/s, fastest: 21.7s=0.46 m/s; 02/28/18: self-selected: 32.0s = 0.31 m/s, fastest: 16.8s=0.60 m/s    Time  8    Period  Weeks    Status  Achieved      PT LONG TERM GOAL #5   Title  Pt will demonstrate ability to perform sit<>stand x1 without using UE support for improved QOL    Baseline  Pt requires at least 1UE support; 01/24/18: Able to perform without UE support    Time  4    Period  Weeks    Status  Achieved      PT LONG TERM GOAL #6   Title   Pt will improve BERG by at least 3 points (51/56) in order to demonstrate clinically significant improvement in balance.      Baseline  23/56; 8/15: 33/56; 9/26: 40/56; 01/24/18: 48/56; 02/28/18: 41/56    Time  6    Period  Weeks    Status  On-going    Target Date  04/25/18            Plan - 03/09/18 1312    Clinical Impression Statement  Patient demonstrates poor stability and strength allowing patient to perform short duration standing interventions with rest periods.  Patient performs beginning and intermediate standing dynamic standing balance exercises to shift weight and perform single leg standing activities with mod assist. Patient fatigues quickly with exercises requiring rest breaks at this time. Patient will continue to benefit from skilled physical therapy to improve pain and mobility.    Rehab Potential  Good    PT Frequency  2x /  week    PT Duration  6 weeks    PT Treatment/Interventions  ADLs/Self Care Home Management;Aquatic Therapy;Cryotherapy;Electrical Stimulation;Iontophoresis 4mg /ml Dexamethasone;Moist Heat;Traction;Ultrasound;DME Instruction;Gait training;Stair training;Functional mobility training;Therapeutic activities;Therapeutic exercise;Balance training;Neuromuscular re-education;Patient/family education;Orthotic Fit/Training;Wheelchair mobility training;Manual techniques;Compression bandaging;Passive range of motion;Dry needling;Energy conservation;Taping    PT Next Visit Plan  Progress HEP as needed and continue with strength and balance training, gait training    PT Home Exercise Plan  seated marching, LAQ    Consulted and Agree with Plan of Care  Patient       Patient will benefit from skilled therapeutic intervention in order to improve the following deficits and impairments:  Abnormal gait, Decreased activity tolerance, Decreased balance, Decreased endurance, Decreased knowledge of use of DME, Decreased mobility, Decreased range of motion, Decreased safety  awareness, Decreased strength, Difficulty walking, Hypomobility, Increased fascial restricitons, Impaired perceived functional ability, Impaired flexibility, Increased muscle spasms, Impaired sensation, Improper body mechanics, Postural dysfunction, Pain  Visit Diagnosis: Unsteadiness on feet  Muscle weakness (generalized)  Other abnormalities of gait and mobility     Problem List Patient Active Problem List   Diagnosis Date Noted  . Malnutrition of moderate degree 03/31/2017  . Acute respiratory failure with hypoxia ([place]) 03/30/2017  . AKI (acute kidney injury) ([place]) 07/15/2016  . Protein-calorie malnutrition, severe 02/08/2016  . Primary cancer of right upper lobe of lung ([place]) 11/20/2015  . Abnormal CT lung screening 10/17/2015  . Personal history of tobacco use, presenting hazards to health 10/15/2015  . Chronic vulvitis 09/26/2014  . Allergic reaction 09/26/2014  . Carotid stenosis 08/30/2014  . Cervical nerve root disorder 08/10/2014  . CAD in native artery 08/10/2014  . B12 deficiency 08/10/2014  . Back ache 08/10/2014  . Bronchitis, chronic ([place]) 08/10/2014  . Diabetes mellitus with polyneuropathy ([place]) 08/10/2014  . Can't get food down 08/10/2014  . Eczema of external ear 08/10/2014  . Accumulation of fluid in tissues 08/10/2014  . Gout 08/10/2014  . Adult hypothyroidism 08/10/2014  . Mononeuritis 08/10/2014  . Muscle ache 08/10/2014  . Disorder of peripheral nervous system 08/10/2014  . Lesion of vulva 08/10/2014  . Cervical spondylosis with radiculopathy 10/19/2013  . COPD exacerbation ([place]) 03/29/2013  . CAD (coronary artery disease) 06/22/2011  . COPD (chronic obstructive pulmonary disease) with emphysema ([place]) 03/07/2010  . CHEST PAIN UNSPECIFIED 07/22/2009  . HLD (hyperlipidemia) 04/01/2009  . Malaise and fatigue 04/01/2009  . Hyperlipidemia 01/18/2009  . TOBACCO ABUSE 01/18/2009  . HYPERTENSION, BENIGN 01/18/2009  . CLAUDICATION 01/18/2009  . Pain in  limb 01/18/2009  . CAFL (chronic airflow limitation) ([place]) 01/20/2007  . Late effects of cerebrovascular disease 01/10/2007  . Essential (primary) hypertension 12/22/2006    Alanson Puls, [place] DPT 03/09/2018, 1:41 PM  [place] MAIN [facility] SERVICES [address] [place], [place], [address] Phone: [phone]   Fax:  [phone]  Name: [name] [name] MRN: [id number] Date of Birth: [birth year]

## 2018-03-14 ENCOUNTER — Ambulatory Visit: Payer: Medicare Other | Admitting: Physical Therapy

## 2018-03-14 ENCOUNTER — Encounter: Payer: Self-pay | Admitting: Physical Therapy

## 2018-03-14 DIAGNOSIS — R2689 Other abnormalities of gait and mobility: Secondary | ICD-10-CM | POA: Diagnosis not present

## 2018-03-14 DIAGNOSIS — M6281 Muscle weakness (generalized): Secondary | ICD-10-CM

## 2018-03-14 DIAGNOSIS — R2681 Unsteadiness on feet: Secondary | ICD-10-CM | POA: Diagnosis not present

## 2018-03-14 NOTE — Therapy (Signed)
[place] MAIN [facility] SERVICES [address] [place], [place], [address] Phone: [phone]   Fax:  [phone]  Physical Therapy Treatment  Patient Details  Name: [name] [name] MRN: [id number] Date of Birth: [birth year] Referring Provider (PT): Jerrol Banana, MD   Encounter Date: 03/14/2018  PT End of Session - 03/14/18 1444    Visit Number  31    Number of Visits  10    Date for PT Re-Evaluation  05/23/18    Authorization Type  5/10 progress note,    Authorization Time Period  Last goals: 02/28/18    PT Start Time  0230    PT Stop Time  0315    PT Time Calculation (min)  45 min    Equipment Utilized During Treatment  Gait belt    Activity Tolerance  Patient tolerated treatment well    Behavior During Therapy  [facility] for tasks assessed/performed       Past Medical History:  Diagnosis Date  . Abnormal CT lung screening 10/17/2015  . COPD (chronic obstructive pulmonary disease) ([place])   . Coronary artery disease, non-occlusive    a. cath 2006: min nonobs CAD; b. cath 12/2010: cath LAD 50%, RCA 60%; c. 08/2013: Minimal luminal irregs, right dominant system with no significant CAD, diffuse luminal irregs noted. Normal EF 55%, no AS or MS.   . Diabetes mellitus   . Hyperlipemia    Followed by Dr. Rosanna Randy  . Hypertension   . Lung cancer ([place])   . Macular degeneration    rt  . Personal history of tobacco use, presenting hazards to health 10/15/2015  . Pneumonia    hx  . Shortness of breath   . Stroke [facility])     Past Surgical History:  Procedure Laterality Date  . ABDOMINAL HYSTERECTOMY    . ANTERIOR CERVICAL DECOMP/DISCECTOMY FUSION N/A 10/19/2013   Procedure: CERVICAL FIVE-SIX ANTERIOR CERVICAL DECOMPRESSION WITH FUSION INTERBODY PROSTHESIS PLATING AND PEEK CAGE;  Surgeon: Ophelia Charter, MD;  Location: [place] NEURO ORS;  Service: Neurosurgery;  Laterality: N/A;  . BACK SURGERY  80's  . BREAST CYST EXCISION Left    left negative   .  CARDIAC CATHETERIZATION  05/2004  . CATARACT EXTRACTION Left   . CHOLECYSTECTOMY    . ENDARTERECTOMY Left 10/24/2014   Procedure: ENDARTERECTOMY CAROTID;  Surgeon: Algernon Huxley, MD;  Location: [facility] ORS;  Service: Vascular;  Laterality: Left;  . ENDOBRONCHIAL ULTRASOUND N/A 11/14/2015   Procedure: ENDOBRONCHIAL ULTRASOUND;  Surgeon: [name], MD;  Location: [facility] ORS;  Service: Cardiopulmonary;  Laterality: N/A;  . PERIPHERAL VASCULAR CATHETERIZATION N/A 12/04/2015   Procedure: Glori Luis Cath Insertion;  Surgeon: Algernon Huxley, MD;  Location: [place] CV LAB;  Service: Cardiovascular;  Laterality: N/A;  . PORTA CATH REMOVAL N/A 05/17/2017   Procedure: PORTA CATH REMOVAL;  Surgeon: Algernon Huxley, MD;  Location: [place] CV LAB;  Service: Cardiovascular;  Laterality: N/A;  . TONSILLECTOMY AND ADENOIDECTOMY    . VESICOVAGINAL FISTULA CLOSURE W/ TAH      There were no vitals filed for this visit.  Subjective Assessment - 03/14/18 1442    Subjective  Pt states that she continues to have headaches since her fall. She  complains of moderate pain in L hip.    Pertinent History  Pt presents with reports of imbalance and BLE weakness. Pt reports that she had HHPT ~1 year ago and since has become more weak.  Pt reports 6 falls  in the past 6 months.  Pt reports she loses her balance due to her RLE giving way, causing her to fall.  Pt with h/o stroke but denies it affecting one side more than the other.  Pt with h/o lung cancer with last treatment ~2 years ago.  Pt reports 8/10 LBP which she attributes to falls in the past.  Pt is able to ascend/descend steps but this is challenging.   Pt has a ramp but she still goes up the steps.  Husband does the driving, grocery shopping.  Pt helps with cooking and cleaning.  Pt independent with showering and dressing.  Pt ambulates with her SPC at all times.  Pt checks her BP, pulse, O2, blood glucose each day.  She also checks the bottom of her feet 1x/wk due to her  peripheral neuropathy (addressed this session to check 1x/day).     How long can you sit comfortably?  no issues    How long can you stand comfortably?  10 minutes    How long can you walk comfortably?  household distances    Patient Stated Goals  Pt would like to be able to walk farther, no falls, up and down steps    Currently in Pain?  Yes    Pain Score  8     Pain Location  Leg    Pain Orientation  Left    Pain Descriptors / Indicators  Aching    Pain Type  Acute pain    Pain Onset  1 to 4 weeks ago    Multiple Pain Sites  No    Pain Onset  Yesterday         TREATMENT   Ther-ex NuStep L2 x 5 minutes for warm-up during history LAQ with 3 lbs x 10 x 2 Quantum single leg press 60# 2 x 20 bilateral; Seated marches with gentle manual resistance 2 x 10; Seated clams with gentle manual resistance 2 x 10; Seated adductor squeeze with gentle manual resistance 2 x 10  Neuromuscular Re-education  Toe taps to 6" step without UE support alternating LE x15 each  Normal stance on Airex pad 2x60 sec   Forward and backward stepping over hurdle x15 each direction  SIde stepping over hurdle 15x each direction    Patient has pain in left hip limiting her ability to stand on her feet as long today.                    PT Education - 03/14/18 1444    Education Details  HEP    Person(s) Educated  Patient    Methods  Explanation    Comprehension  Verbalized understanding       PT Short Term Goals - 02/28/18 1359      PT SHORT TERM GOAL #1   Title  Pt will independently complete HEP at least 4 days/wk for improved carryover between sessions    Baseline  reports compliance with HEP, 01/24/18: performing HEP daily    Time  2    Period  Weeks    Status  Achieved      PT SHORT TERM GOAL #2   Title  Pt will inspect her feet at least 1x/day for proper foot care due to peripheral neuropathy    Baseline  increasing frequency; 01/24/18: performs daily; 02/28/18: performs  daily    Time  2    Period  Weeks    Status  Achieved  PT Long Term Goals - 02/28/18 1359      PT LONG TERM GOAL #1   Title  Pt's ABC scale will improve to at least 50% to demonstrate decreased perception of imbalance    Baseline  20%; 8/15: 45%; 9/26: 57.5%; 01/24/18: 55%; 02/28/18: 25.63%    Time  8    Period  Weeks    Status  On-going    Target Date  04/25/18      PT LONG TERM GOAL #2   Title  Pt's TUG time (with SPC) will decrease to less than or equal to 20 seconds to demonstrate improved mobility    Baseline  32 seconds; 8/15: 31 seconds; 9/26: 18.09 sec; 01/24/18: 21.1s; 02/28/18: 18.7s    Time  8    Period  Weeks    Status  Achieved    Target Date  01/21/18      PT LONG TERM GOAL #3   Title  Pt's Berg Balance Test will improve to at least 42/56 to demonstrate decreased risk of falling    Baseline  23/56; 8/15: 33/56; 9/26: 40/56; 01/24/18: 48/56; 02/28/18: 41/56    Time  8    Period  Weeks    Status  Achieved    Target Date  04/25/18      PT LONG TERM GOAL #4   Title  Pt's 16mWT (with SPC) will improve to at least 0.6 m/s to demonstrate improved gait speed in the home in community    Baseline  0.26 m/s; 8/15: .42 m/s with AD; 9/26: 0.54 m/s; 01/24/18: self-selected: 35.0s = 0.29 m/s, fastest: 21.7s=0.46 m/s; 02/28/18: self-selected: 32.0s = 0.31 m/s, fastest: 16.8s=0.60 m/s    Time  8    Period  Weeks    Status  Achieved      PT LONG TERM GOAL #5   Title  Pt will demonstrate ability to perform sit<>stand x1 without using UE support for improved QOL    Baseline  Pt requires at least 1UE support; 01/24/18: Able to perform without UE support    Time  4    Period  Weeks    Status  Achieved      PT LONG TERM GOAL #6   Title  Pt will improve BERG by at least 3 points (51/56) in order to demonstrate clinically significant improvement in balance.      Baseline  23/56; 8/15: 33/56; 9/26: 40/56; 01/24/18: 48/56; 02/28/18: 41/56    Time  6    Period  Weeks     Status  On-going    Target Date  04/25/18            Plan - 03/14/18 1448    Clinical Impression Statement  Pt is limited in exercises today due to left hip pain 8/10. She has   decreased  dynamic standing  during reaching activities.  Pt continues to demonstrate improvement with dynamic and static balance, with decreased LOB noted with dynamic activities on even and uneven surfaces.  Pt has decreased  strength and single leg stability.  Pt would continue to benefit from skilled therapy services in order to continue strengthening LE's and improving dynamic and static balance.    Rehab Potential  Good    PT Frequency  2x / week    PT Duration  6 weeks    PT Treatment/Interventions  ADLs/Self Care Home Management;Aquatic Therapy;Cryotherapy;Electrical Stimulation;Iontophoresis 4mg /ml Dexamethasone;Moist Heat;Traction;Ultrasound;DME Instruction;Gait training;Stair training;Functional mobility training;Therapeutic activities;Therapeutic exercise;Balance training;Neuromuscular re-education;Patient/family education;Orthotic Fit/Training;Wheelchair mobility training;Manual techniques;Compression bandaging;Passive  range of motion;Dry needling;Energy conservation;Taping    PT Next Visit Plan  Progress HEP as needed and continue with strength and balance training, gait training    PT Home Exercise Plan  seated marching, LAQ    Consulted and Agree with Plan of Care  Patient       Patient will benefit from skilled therapeutic intervention in order to improve the following deficits and impairments:  Abnormal gait, Decreased activity tolerance, Decreased balance, Decreased endurance, Decreased knowledge of use of DME, Decreased mobility, Decreased range of motion, Decreased safety awareness, Decreased strength, Difficulty walking, Hypomobility, Increased fascial restricitons, Impaired perceived functional ability, Impaired flexibility, Increased muscle spasms, Impaired sensation, Improper body mechanics,  Postural dysfunction, Pain  Visit Diagnosis: Unsteadiness on feet  Muscle weakness (generalized)  Other abnormalities of gait and mobility     Problem List Patient Active Problem List   Diagnosis Date Noted  . Malnutrition of moderate degree 03/31/2017  . Acute respiratory failure with hypoxia ([place]) 03/30/2017  . AKI (acute kidney injury) ([place]) 07/15/2016  . Protein-calorie malnutrition, severe 02/08/2016  . Primary cancer of right upper lobe of lung ([place]) 11/20/2015  . Abnormal CT lung screening 10/17/2015  . Personal history of tobacco use, presenting hazards to health 10/15/2015  . Chronic vulvitis 09/26/2014  . Allergic reaction 09/26/2014  . Carotid stenosis 08/30/2014  . Cervical nerve root disorder 08/10/2014  . CAD in native artery 08/10/2014  . B12 deficiency 08/10/2014  . Back ache 08/10/2014  . Bronchitis, chronic ([place]) 08/10/2014  . Diabetes mellitus with polyneuropathy ([place]) 08/10/2014  . Can't get food down 08/10/2014  . Eczema of external ear 08/10/2014  . Accumulation of fluid in tissues 08/10/2014  . Gout 08/10/2014  . Adult hypothyroidism 08/10/2014  . Mononeuritis 08/10/2014  . Muscle ache 08/10/2014  . Disorder of peripheral nervous system 08/10/2014  . Lesion of vulva 08/10/2014  . Cervical spondylosis with radiculopathy 10/19/2013  . COPD exacerbation ([place]) 03/29/2013  . CAD (coronary artery disease) 06/22/2011  . COPD (chronic obstructive pulmonary disease) with emphysema ([place]) 03/07/2010  . CHEST PAIN UNSPECIFIED 07/22/2009  . HLD (hyperlipidemia) 04/01/2009  . Malaise and fatigue 04/01/2009  . Hyperlipidemia 01/18/2009  . TOBACCO ABUSE 01/18/2009  . HYPERTENSION, BENIGN 01/18/2009  . CLAUDICATION 01/18/2009  . Pain in limb 01/18/2009  . CAFL (chronic airflow limitation) ([place]) 01/20/2007  . Late effects of cerebrovascular disease 01/10/2007  . Essential (primary) hypertension 12/22/2006    Alanson Puls, [place] DPT 03/14/2018, 2:51  PM  [place] MAIN [facility] SERVICES [address] [place], [place], [address] Phone: [phone]   Fax:  [phone]  Name: MARLENNE RIDGE MRN: [id number] Date of Birth: 07-02-1940

## 2018-03-17 ENCOUNTER — Encounter: Payer: Self-pay | Admitting: Physical Therapy

## 2018-03-17 ENCOUNTER — Ambulatory Visit: Payer: Medicare Other | Admitting: Physical Therapy

## 2018-03-17 DIAGNOSIS — R2681 Unsteadiness on feet: Secondary | ICD-10-CM

## 2018-03-17 DIAGNOSIS — R2689 Other abnormalities of gait and mobility: Secondary | ICD-10-CM

## 2018-03-17 DIAGNOSIS — M6281 Muscle weakness (generalized): Secondary | ICD-10-CM | POA: Diagnosis not present

## 2018-03-17 NOTE — Therapy (Signed)
[place] MAIN [facility] SERVICES [address] [place], [place], [address] Phone: [phone]   Fax:  [phone]  Physical Therapy Treatment  Patient Details  Name: [name] [name] MRN: [id number] Date of Birth: 1940-06-04 Referring Provider (PT): Jerrol Banana, MD   Encounter Date: 03/17/2018  PT End of Session - 03/17/18 1311    Visit Number  32    Number of Visits  68    Date for PT Re-Evaluation  05/23/18    Authorization Type  6/10 progress note,    Authorization Time Period  Last goals: 02/28/18    PT Start Time  0105    PT Stop Time  0145    PT Time Calculation (min)  40 min    Equipment Utilized During Treatment  Gait belt    Activity Tolerance  Patient tolerated treatment well    Behavior During Therapy  [facility] for tasks assessed/performed       Past Medical History:  Diagnosis Date  . Abnormal CT lung screening 10/17/2015  . COPD (chronic obstructive pulmonary disease) ([place])   . Coronary artery disease, non-occlusive    a. cath 2006: min nonobs CAD; b. cath 12/2010: cath LAD 50%, RCA 60%; c. 08/2013: Minimal luminal irregs, right dominant system with no significant CAD, diffuse luminal irregs noted. Normal EF 55%, no AS or MS.   . Diabetes mellitus   . Hyperlipemia    Followed by Dr. Rosanna Randy  . Hypertension   . Lung cancer ([place])   . Macular degeneration    rt  . Personal history of tobacco use, presenting hazards to health 10/15/2015  . Pneumonia    hx  . Shortness of breath   . Stroke [facility])     Past Surgical History:  Procedure Laterality Date  . ABDOMINAL HYSTERECTOMY    . ANTERIOR CERVICAL DECOMP/DISCECTOMY FUSION N/A 10/19/2013   Procedure: CERVICAL FIVE-SIX ANTERIOR CERVICAL DECOMPRESSION WITH FUSION INTERBODY PROSTHESIS PLATING AND PEEK CAGE;  Surgeon: Ophelia Charter, MD;  Location: [place] NEURO ORS;  Service: Neurosurgery;  Laterality: N/A;  . BACK SURGERY  80's  . BREAST CYST EXCISION Left    left negative    . CARDIAC CATHETERIZATION  05/2004  . CATARACT EXTRACTION Left   . CHOLECYSTECTOMY    . ENDARTERECTOMY Left 10/24/2014   Procedure: ENDARTERECTOMY CAROTID;  Surgeon: Algernon Huxley, MD;  Location: [facility] ORS;  Service: Vascular;  Laterality: Left;  . ENDOBRONCHIAL ULTRASOUND N/A 11/14/2015   Procedure: ENDOBRONCHIAL ULTRASOUND;  Surgeon: [name], MD;  Location: [facility] ORS;  Service: Cardiopulmonary;  Laterality: N/A;  . PERIPHERAL VASCULAR CATHETERIZATION N/A 12/04/2015   Procedure: Glori Luis Cath Insertion;  Surgeon: Algernon Huxley, MD;  Location: [place] CV LAB;  Service: Cardiovascular;  Laterality: N/A;  . PORTA CATH REMOVAL N/A 05/17/2017   Procedure: PORTA CATH REMOVAL;  Surgeon: Algernon Huxley, MD;  Location: [place] CV LAB;  Service: Cardiovascular;  Laterality: N/A;  . TONSILLECTOMY AND ADENOIDECTOMY    . VESICOVAGINAL FISTULA CLOSURE W/ TAH      There were no vitals filed for this visit.  Subjective Assessment - 03/17/18 1310    Subjective  Pt states that she continues to have headaches since her fall. She  complains of moderate pain in L hip.    Pertinent History  Pt presents with reports of imbalance and BLE weakness. Pt reports that she had HHPT ~1 year ago and since has become more weak.  Pt reports 6 falls  in the past 6 months.  Pt reports she loses her balance due to her RLE giving way, causing her to fall.  Pt with h/o stroke but denies it affecting one side more than the other.  Pt with h/o lung cancer with last treatment ~2 years ago.  Pt reports 8/10 LBP which she attributes to falls in the past.  Pt is able to ascend/descend steps but this is challenging.   Pt has a ramp but she still goes up the steps.  Husband does the driving, grocery shopping.  Pt helps with cooking and cleaning.  Pt independent with showering and dressing.  Pt ambulates with her SPC at all times.  Pt checks her BP, pulse, O2, blood glucose each day.  She also checks the bottom of her feet 1x/wk due to her  peripheral neuropathy (addressed this session to check 1x/day).     How long can you sit comfortably?  no issues    How long can you stand comfortably?  10 minutes    How long can you walk comfortably?  household distances    Patient Stated Goals  Pt would like to be able to walk farther, no falls, up and down steps    Currently in Pain?  No/denies    Pain Score  0-No pain    Pain Onset  1 to 4 weeks ago    Pain Onset  Yesterday       Ther-ex  NuStep L2 x 4 minutes for warm-up during history; Pt completed ABC=25.63% (unbilled); Performed TUG (18.7s) 66m gait speed (self-selected: 32.0s = 0.31 m/s, fastest: 16.8s=0.60 m/s) and BERG (41/56) with patient; Reviewed outcome measures, updated goals, and discussed plan of care with patient; Standing hip abd with 2# ankle weight x 10 BLEandBUE support  Standing hip extension with 2# ankle weight x 10 BLE with BUE support  Standing hip flexion marches with 2# ankle weight x 10 BLE with BUE support  Standing HS curls with 2# ankle weight x 10  Neuromuscular Re-education  Toe taps to 6" step without UE support alternating LE x15 each  Normal stance on Airex pad 2x 60sec?  Forward and backward stepping over hurdle x15 each direction  SIde stepping over hurdle 15x each direction   Pt educated throughout session about proper posture and technique with exercises. Improved exercise technique, movement at target joints, use of target muscles after min to mod verbal, visual, tactile cues                         PT Education - 03/17/18 1310    Education Details  HEP    Person(s) Educated  Patient    Methods  Explanation    Comprehension  Verbalized understanding;Returned demonstration;Need further instruction       PT Short Term Goals - 02/28/18 1359      PT SHORT TERM GOAL #1   Title  Pt will independently complete HEP at least 4 days/wk for improved carryover between sessions    Baseline  reports compliance with  HEP, 01/24/18: performing HEP daily    Time  2    Period  Weeks    Status  Achieved      PT SHORT TERM GOAL #2   Title  Pt will inspect her feet at least 1x/day for proper foot care due to peripheral neuropathy    Baseline  increasing frequency; 01/24/18: performs daily; 02/28/18: performs daily    Time  2  Period  Weeks    Status  Achieved        PT Long Term Goals - 02/28/18 1359      PT LONG TERM GOAL #1   Title  Pt's ABC scale will improve to at least 50% to demonstrate decreased perception of imbalance    Baseline  20%; 8/15: 45%; 9/26: 57.5%; 01/24/18: 55%; 02/28/18: 25.63%    Time  8    Period  Weeks    Status  On-going    Target Date  04/25/18      PT LONG TERM GOAL #2   Title  Pt's TUG time (with SPC) will decrease to less than or equal to 20 seconds to demonstrate improved mobility    Baseline  32 seconds; 8/15: 31 seconds; 9/26: 18.09 sec; 01/24/18: 21.1s; 02/28/18: 18.7s    Time  8    Period  Weeks    Status  Achieved    Target Date  01/21/18      PT LONG TERM GOAL #3   Title  Pt's Berg Balance Test will improve to at least 42/56 to demonstrate decreased risk of falling    Baseline  23/56; 8/15: 33/56; 9/26: 40/56; 01/24/18: 48/56; 02/28/18: 41/56    Time  8    Period  Weeks    Status  Achieved    Target Date  04/25/18      PT LONG TERM GOAL #4   Title  Pt's 62mWT (with SPC) will improve to at least 0.6 m/s to demonstrate improved gait speed in the home in community    Baseline  0.26 m/s; 8/15: .42 m/s with AD; 9/26: 0.54 m/s; 01/24/18: self-selected: 35.0s = 0.29 m/s, fastest: 21.7s=0.46 m/s; 02/28/18: self-selected: 32.0s = 0.31 m/s, fastest: 16.8s=0.60 m/s    Time  8    Period  Weeks    Status  Achieved      PT LONG TERM GOAL #5   Title  Pt will demonstrate ability to perform sit<>stand x1 without using UE support for improved QOL    Baseline  Pt requires at least 1UE support; 01/24/18: Able to perform without UE support    Time  4    Period  Weeks     Status  Achieved      PT LONG TERM GOAL #6   Title  Pt will improve BERG by at least 3 points (51/56) in order to demonstrate clinically significant improvement in balance.      Baseline  23/56; 8/15: 33/56; 9/26: 40/56; 01/24/18: 48/56; 02/28/18: 41/56    Time  6    Period  Weeks    Status  On-going    Target Date  04/25/18            Plan - 03/17/18 1312    Clinical Impression Statement  Patient presents with decreased BLE strength, decreased gait speed, decreased standing balance.  Patient's main complaint is pain and inability to participate in desired activities. Patient will benefit from skilled therapy in order to increase LE strength and standing balance and increase gait speed and improve her ability to participate in desired activities, and decrease her falls risk    Rehab Potential  Good    PT Frequency  2x / week    PT Duration  6 weeks    PT Treatment/Interventions  ADLs/Self Care Home Management;Aquatic Therapy;Cryotherapy;Electrical Stimulation;Iontophoresis 4mg /ml Dexamethasone;Moist Heat;Traction;Ultrasound;DME Instruction;Gait training;Stair training;Functional mobility training;Therapeutic activities;Therapeutic exercise;Balance training;Neuromuscular re-education;Patient/family education;Orthotic Fit/Training;Wheelchair mobility training;Manual techniques;Compression bandaging;Passive range of motion;Dry needling;Energy conservation;Taping  PT Next Visit Plan  Progress HEP as needed and continue with strength and balance training, gait training    PT Home Exercise Plan  seated marching, LAQ    Consulted and Agree with Plan of Care  Patient       Patient will benefit from skilled therapeutic intervention in order to improve the following deficits and impairments:  Abnormal gait, Decreased activity tolerance, Decreased balance, Decreased endurance, Decreased knowledge of use of DME, Decreased mobility, Decreased range of motion, Decreased safety awareness,  Decreased strength, Difficulty walking, Hypomobility, Increased fascial restricitons, Impaired perceived functional ability, Impaired flexibility, Increased muscle spasms, Impaired sensation, Improper body mechanics, Postural dysfunction, Pain  Visit Diagnosis: Unsteadiness on feet  Muscle weakness (generalized)  Other abnormalities of gait and mobility     Problem List Patient Active Problem List   Diagnosis Date Noted  . Malnutrition of moderate degree 03/31/2017  . Acute respiratory failure with hypoxia ([place]) 03/30/2017  . AKI (acute kidney injury) ([place]) 07/15/2016  . Protein-calorie malnutrition, severe 02/08/2016  . Primary cancer of right upper lobe of lung ([place]) 11/20/2015  . Abnormal CT lung screening 10/17/2015  . Personal history of tobacco use, presenting hazards to health 10/15/2015  . Chronic vulvitis 09/26/2014  . Allergic reaction 09/26/2014  . Carotid stenosis 08/30/2014  . Cervical nerve root disorder 08/10/2014  . CAD in native artery 08/10/2014  . B12 deficiency 08/10/2014  . Back ache 08/10/2014  . Bronchitis, chronic ([place]) 08/10/2014  . Diabetes mellitus with polyneuropathy ([place]) 08/10/2014  . Can't get food down 08/10/2014  . Eczema of external ear 08/10/2014  . Accumulation of fluid in tissues 08/10/2014  . Gout 08/10/2014  . Adult hypothyroidism 08/10/2014  . Mononeuritis 08/10/2014  . Muscle ache 08/10/2014  . Disorder of peripheral nervous system 08/10/2014  . Lesion of vulva 08/10/2014  . Cervical spondylosis with radiculopathy 10/19/2013  . COPD exacerbation ([place]) 03/29/2013  . CAD (coronary artery disease) 06/22/2011  . COPD (chronic obstructive pulmonary disease) with emphysema ([place]) 03/07/2010  . CHEST PAIN UNSPECIFIED 07/22/2009  . HLD (hyperlipidemia) 04/01/2009  . Malaise and fatigue 04/01/2009  . Hyperlipidemia 01/18/2009  . TOBACCO ABUSE 01/18/2009  . HYPERTENSION, BENIGN 01/18/2009  . CLAUDICATION 01/18/2009  . Pain in limb  01/18/2009  . CAFL (chronic airflow limitation) ([place]) 01/20/2007  . Late effects of cerebrovascular disease 01/10/2007  . Essential (primary) hypertension 12/22/2006    Alanson Puls, [place] DPT 03/17/2018, 1:14 PM  [place] MAIN [facility] SERVICES [address] [place], [place], [address] Phone: [phone]   Fax:  [phone]  Name: [name] [name] MRN: [id number] Date of Birth: 28-Jun-1940

## 2018-03-21 ENCOUNTER — Ambulatory Visit: Payer: Medicare Other

## 2018-03-21 DIAGNOSIS — R2689 Other abnormalities of gait and mobility: Secondary | ICD-10-CM | POA: Diagnosis not present

## 2018-03-21 DIAGNOSIS — R2681 Unsteadiness on feet: Secondary | ICD-10-CM

## 2018-03-21 DIAGNOSIS — M6281 Muscle weakness (generalized): Secondary | ICD-10-CM | POA: Diagnosis not present

## 2018-03-21 NOTE — Therapy (Signed)
[place] MAIN [facility] SERVICES [address] [place], [place], [address] Phone: [phone]   Fax:  [phone]  Physical Therapy Treatment  Patient Details  Name: [name] [name] MRN: [id number] Date of Birth: [birth year] Referring Provider (PT): Jerrol Banana, MD   Encounter Date: 03/21/2018  PT End of Session - 03/21/18 1519    Visit Number  33    Number of Visits  21    Date for PT Re-Evaluation  05/23/18    Authorization Type  7/10 progress note,    Authorization Time Period  Last goals: 02/28/18    PT Start Time  1514    PT Stop Time  1600    PT Time Calculation (min)  46 min    Equipment Utilized During Treatment  Gait belt    Activity Tolerance  Patient tolerated treatment well    Behavior During Therapy  [facility] for tasks assessed/performed       Past Medical History:  Diagnosis Date  . Abnormal CT lung screening 10/17/2015  . COPD (chronic obstructive pulmonary disease) ([place])   . Coronary artery disease, non-occlusive    a. cath 2006: min nonobs CAD; b. cath 12/2010: cath LAD 50%, RCA 60%; c. 08/2013: Minimal luminal irregs, right dominant system with no significant CAD, diffuse luminal irregs noted. Normal EF 55%, no AS or MS.   . Diabetes mellitus   . Hyperlipemia    Followed by Dr. Rosanna Randy  . Hypertension   . Lung cancer ([place])   . Macular degeneration    rt  . Personal history of tobacco use, presenting hazards to health 10/15/2015  . Pneumonia    hx  . Shortness of breath   . Stroke [facility])     Past Surgical History:  Procedure Laterality Date  . ABDOMINAL HYSTERECTOMY    . ANTERIOR CERVICAL DECOMP/DISCECTOMY FUSION N/A 10/19/2013   Procedure: CERVICAL FIVE-SIX ANTERIOR CERVICAL DECOMPRESSION WITH FUSION INTERBODY PROSTHESIS PLATING AND PEEK CAGE;  Surgeon: Ophelia Charter, MD;  Location: [place] NEURO ORS;  Service: Neurosurgery;  Laterality: N/A;  . BACK SURGERY  80's  . BREAST CYST EXCISION Left    left negative    . CARDIAC CATHETERIZATION  05/2004  . CATARACT EXTRACTION Left   . CHOLECYSTECTOMY    . ENDARTERECTOMY Left 10/24/2014   Procedure: ENDARTERECTOMY CAROTID;  Surgeon: Algernon Huxley, MD;  Location: [facility] ORS;  Service: Vascular;  Laterality: Left;  . ENDOBRONCHIAL ULTRASOUND N/A 11/14/2015   Procedure: ENDOBRONCHIAL ULTRASOUND;  Surgeon: [name], MD;  Location: [facility] ORS;  Service: Cardiopulmonary;  Laterality: N/A;  . PERIPHERAL VASCULAR CATHETERIZATION N/A 12/04/2015   Procedure: Glori Luis Cath Insertion;  Surgeon: Algernon Huxley, MD;  Location: [place] CV LAB;  Service: Cardiovascular;  Laterality: N/A;  . PORTA CATH REMOVAL N/A 05/17/2017   Procedure: PORTA CATH REMOVAL;  Surgeon: Algernon Huxley, MD;  Location: [place] CV LAB;  Service: Cardiovascular;  Laterality: N/A;  . TONSILLECTOMY AND ADENOIDECTOMY    . VESICOVAGINAL FISTULA CLOSURE W/ TAH      There were no vitals filed for this visit.  Subjective Assessment - 03/21/18 1517    Subjective  Patient reports no pain, stumbles or falls since last session. Is having a hard time getting up from chairs and walking. Over the weekend had L hip pain and wasn't able to move well.     Pertinent History  Pt presents with reports of imbalance and BLE weakness. Pt reports that she had  HHPT ~1 year ago and since has become more weak.  Pt reports 6 falls in the past 6 months.  Pt reports she loses her balance due to her RLE giving way, causing her to fall.  Pt with h/o stroke but denies it affecting one side more than the other.  Pt with h/o lung cancer with last treatment ~2 years ago.  Pt reports 8/10 LBP which she attributes to falls in the past.  Pt is able to ascend/descend steps but this is challenging.   Pt has a ramp but she still goes up the steps.  Husband does the driving, grocery shopping.  Pt helps with cooking and cleaning.  Pt independent with showering and dressing.  Pt ambulates with her SPC at all times.  Pt checks her BP, pulse, O2,  blood glucose each day.  She also checks the bottom of her feet 1x/wk due to her peripheral neuropathy (addressed this session to check 1x/day).     How long can you sit comfortably?  no issues    How long can you stand comfortably?  10 minutes    How long can you walk comfortably?  household distances    Patient Stated Goals  Pt would like to be able to walk farther, no falls, up and down steps    Currently in Pain?  No/denies       Ther-ex  Sit to stand from plinth table, ambulate with hurrycane around cone 10 ft away and return to sitting. 6x  Seated: rockerboard df stretch 2 minutes rocking df and pf Seated: hamstring stretch on 6" step 2x 30 second holds 6" step; decreasing UE support to two finger hold: 15 xeach LE Speed ladder: one foot in each box, focus on widening step length with SUE support 6x length of // bars.   Reaching inside and outside BOS tapping balloon 2 minutes  Step over and back half foam roller 10x each LE, occasional SUE support Side step over and back half foam roller 10x each LE, occasional SUE support      Pt educated throughout session about proper posture and technique with exercises. Improved exercise technique, movement at target joints, use of target muscles after min to mod verbal, visual, tactile cues.                           PT Education - 03/21/18 1519    Education Details  exercise technique,  mobility     Person(s) Educated  Patient    Methods  Explanation;Demonstration;Tactile cues;Verbal cues    Comprehension  Verbalized understanding;Returned demonstration;Tactile cues required;Need further instruction       PT Short Term Goals - 02/28/18 1359      PT SHORT TERM GOAL #1   Title  Pt will independently complete HEP at least 4 days/wk for improved carryover between sessions    Baseline  reports compliance with HEP, 01/24/18: performing HEP daily    Time  2    Period  Weeks    Status  Achieved      PT SHORT TERM  GOAL #2   Title  Pt will inspect her feet at least 1x/day for proper foot care due to peripheral neuropathy    Baseline  increasing frequency; 01/24/18: performs daily; 02/28/18: performs daily    Time  2    Period  Weeks    Status  Achieved        PT Long Term Goals -  02/28/18 1359      PT LONG TERM GOAL #1   Title  Pt's ABC scale will improve to at least 50% to demonstrate decreased perception of imbalance    Baseline  20%; 8/15: 45%; 9/26: 57.5%; 01/24/18: 55%; 02/28/18: 25.63%    Time  8    Period  Weeks    Status  On-going    Target Date  04/25/18      PT LONG TERM GOAL #2   Title  Pt's TUG time (with SPC) will decrease to less than or equal to 20 seconds to demonstrate improved mobility    Baseline  32 seconds; 8/15: 31 seconds; 9/26: 18.09 sec; 01/24/18: 21.1s; 02/28/18: 18.7s    Time  8    Period  Weeks    Status  Achieved    Target Date  01/21/18      PT LONG TERM GOAL #3   Title  Pt's Berg Balance Test will improve to at least 42/56 to demonstrate decreased risk of falling    Baseline  23/56; 8/15: 33/56; 9/26: 40/56; 01/24/18: 48/56; 02/28/18: 41/56    Time  8    Period  Weeks    Status  Achieved    Target Date  04/25/18      PT LONG TERM GOAL #4   Title  Pt's 69mWT (with SPC) will improve to at least 0.6 m/s to demonstrate improved gait speed in the home in community    Baseline  0.26 m/s; 8/15: .42 m/s with AD; 9/26: 0.54 m/s; 01/24/18: self-selected: 35.0s = 0.29 m/s, fastest: 21.7s=0.46 m/s; 02/28/18: self-selected: 32.0s = 0.31 m/s, fastest: 16.8s=0.60 m/s    Time  8    Period  Weeks    Status  Achieved      PT LONG TERM GOAL #5   Title  Pt will demonstrate ability to perform sit<>stand x1 without using UE support for improved QOL    Baseline  Pt requires at least 1UE support; 01/24/18: Able to perform without UE support    Time  4    Period  Weeks    Status  Achieved      PT LONG TERM GOAL #6   Title  Pt will improve BERG by at least 3 points  (51/56) in order to demonstrate clinically significant improvement in balance.      Baseline  23/56; 8/15: 33/56; 9/26: 40/56; 01/24/18: 48/56; 02/28/18: 41/56    Time  6    Period  Weeks    Status  On-going    Target Date  04/25/18            Plan - 03/21/18 1552    Clinical Impression Statement  Patient challenged with lifting LE's and clearing feet when ambulating due to fear of LOB resulting in shuffle steps. Patient challenged with stability  Without UE support resulting in bilateral knee flexion. Tolerates hamstring stretching. Patient will benefit from skilled therapy in order to increase LE strength and standing balance and increase gait speed and improve her ability to participate in desired activities, and decrease her falls risk    Rehab Potential  Good    PT Frequency  2x / week    PT Duration  6 weeks    PT Treatment/Interventions  ADLs/Self Care Home Management;Aquatic Therapy;Cryotherapy;Electrical Stimulation;Iontophoresis 4mg /ml Dexamethasone;Moist Heat;Traction;Ultrasound;DME Instruction;Gait training;Stair training;Functional mobility training;Therapeutic activities;Therapeutic exercise;Balance training;Neuromuscular re-education;Patient/family education;Orthotic Fit/Training;Wheelchair mobility training;Manual techniques;Compression bandaging;Passive range of motion;Dry needling;Energy conservation;Taping    PT Next Visit Plan  Progress HEP as  needed and continue with strength and balance training, gait training    PT Home Exercise Plan  seated marching, LAQ    Consulted and Agree with Plan of Care  Patient       Patient will benefit from skilled therapeutic intervention in order to improve the following deficits and impairments:  Abnormal gait, Decreased activity tolerance, Decreased balance, Decreased endurance, Decreased knowledge of use of DME, Decreased mobility, Decreased range of motion, Decreased safety awareness, Decreased strength, Difficulty walking,  Hypomobility, Increased fascial restricitons, Impaired perceived functional ability, Impaired flexibility, Increased muscle spasms, Impaired sensation, Improper body mechanics, Postural dysfunction, Pain  Visit Diagnosis: Unsteadiness on feet  Muscle weakness (generalized)  Other abnormalities of gait and mobility     Problem List Patient Active Problem List   Diagnosis Date Noted  . Malnutrition of moderate degree 03/31/2017  . Acute respiratory failure with hypoxia ([place]) 03/30/2017  . AKI (acute kidney injury) ([place]) 07/15/2016  . Protein-calorie malnutrition, severe 02/08/2016  . Primary cancer of right upper lobe of lung ([place]) 11/20/2015  . Abnormal CT lung screening 10/17/2015  . Personal history of tobacco use, presenting hazards to health 10/15/2015  . Chronic vulvitis 09/26/2014  . Allergic reaction 09/26/2014  . Carotid stenosis 08/30/2014  . Cervical nerve root disorder 08/10/2014  . CAD in native artery 08/10/2014  . B12 deficiency 08/10/2014  . Back ache 08/10/2014  . Bronchitis, chronic ([place]) 08/10/2014  . Diabetes mellitus with polyneuropathy ([place]) 08/10/2014  . Can't get food down 08/10/2014  . Eczema of external ear 08/10/2014  . Accumulation of fluid in tissues 08/10/2014  . Gout 08/10/2014  . Adult hypothyroidism 08/10/2014  . Mononeuritis 08/10/2014  . Muscle ache 08/10/2014  . Disorder of peripheral nervous system 08/10/2014  . Lesion of vulva 08/10/2014  . Cervical spondylosis with radiculopathy 10/19/2013  . COPD exacerbation ([place]) 03/29/2013  . CAD (coronary artery disease) 06/22/2011  . COPD (chronic obstructive pulmonary disease) with emphysema ([place]) 03/07/2010  . CHEST PAIN UNSPECIFIED 07/22/2009  . HLD (hyperlipidemia) 04/01/2009  . Malaise and fatigue 04/01/2009  . Hyperlipidemia 01/18/2009  . TOBACCO ABUSE 01/18/2009  . HYPERTENSION, BENIGN 01/18/2009  . CLAUDICATION 01/18/2009  . Pain in limb 01/18/2009  . CAFL (chronic airflow  limitation) ([place]) 01/20/2007  . Late effects of cerebrovascular disease 01/10/2007  . Essential (primary) hypertension 12/22/2006   Janna Arch, PT, DPT   03/21/2018, 4:06 PM  [place] MAIN [facility] SERVICES [address] [place], [place], [address] Phone: [phone]   Fax:  [phone]  Name: [name] [name] MRN: [id number] Date of Birth: 02/12/1941

## 2018-03-24 ENCOUNTER — Ambulatory Visit: Payer: Medicare Other

## 2018-03-24 DIAGNOSIS — M6281 Muscle weakness (generalized): Secondary | ICD-10-CM | POA: Diagnosis not present

## 2018-03-24 DIAGNOSIS — R2689 Other abnormalities of gait and mobility: Secondary | ICD-10-CM | POA: Diagnosis not present

## 2018-03-24 DIAGNOSIS — R2681 Unsteadiness on feet: Secondary | ICD-10-CM

## 2018-03-24 NOTE — Therapy (Signed)
[place] MAIN [facility] SERVICES [address] [place], [place], [address] Phone: [phone]   Fax:  [phone]  Physical Therapy Treatment  Patient Details  Name: [name] [name] MRN: [id number] Date of Birth: 1940/12/23 Referring Provider (PT): Jerrol Banana, MD   Encounter Date: 03/24/2018  PT End of Session - 03/24/18 1524    Visit Number  34    Number of Visits  3    Date for PT Re-Evaluation  05/23/18    Authorization Type  8/10 progress note,    Authorization Time Period  Last goals: 02/28/18    PT Start Time  1518    PT Stop Time  1600    PT Time Calculation (min)  42 min    Equipment Utilized During Treatment  Gait belt    Activity Tolerance  Patient tolerated treatment well    Behavior During Therapy  [facility] for tasks assessed/performed       Past Medical History:  Diagnosis Date  . Abnormal CT lung screening 10/17/2015  . COPD (chronic obstructive pulmonary disease) ([place])   . Coronary artery disease, non-occlusive    a. cath 2006: min nonobs CAD; b. cath 12/2010: cath LAD 50%, RCA 60%; c. 08/2013: Minimal luminal irregs, right dominant system with no significant CAD, diffuse luminal irregs noted. Normal EF 55%, no AS or MS.   . Diabetes mellitus   . Hyperlipemia    Followed by Dr. Rosanna Randy  . Hypertension   . Lung cancer ([place])   . Macular degeneration    rt  . Personal history of tobacco use, presenting hazards to health 10/15/2015  . Pneumonia    hx  . Shortness of breath   . Stroke [facility])     Past Surgical History:  Procedure Laterality Date  . ABDOMINAL HYSTERECTOMY    . ANTERIOR CERVICAL DECOMP/DISCECTOMY FUSION N/A 10/19/2013   Procedure: CERVICAL FIVE-SIX ANTERIOR CERVICAL DECOMPRESSION WITH FUSION INTERBODY PROSTHESIS PLATING AND PEEK CAGE;  Surgeon: Ophelia Charter, MD;  Location: [place] NEURO ORS;  Service: Neurosurgery;  Laterality: N/A;  . BACK SURGERY  80's  . BREAST CYST EXCISION Left    left negative    . CARDIAC CATHETERIZATION  05/2004  . CATARACT EXTRACTION Left   . CHOLECYSTECTOMY    . ENDARTERECTOMY Left 10/24/2014   Procedure: ENDARTERECTOMY CAROTID;  Surgeon: Algernon Huxley, MD;  Location: [facility] ORS;  Service: Vascular;  Laterality: Left;  . ENDOBRONCHIAL ULTRASOUND N/A 11/14/2015   Procedure: ENDOBRONCHIAL ULTRASOUND;  Surgeon: [name], MD;  Location: [facility] ORS;  Service: Cardiopulmonary;  Laterality: N/A;  . PERIPHERAL VASCULAR CATHETERIZATION N/A 12/04/2015   Procedure: Glori Luis Cath Insertion;  Surgeon: Algernon Huxley, MD;  Location: [place] CV LAB;  Service: Cardiovascular;  Laterality: N/A;  . PORTA CATH REMOVAL N/A 05/17/2017   Procedure: PORTA CATH REMOVAL;  Surgeon: Algernon Huxley, MD;  Location: [place] CV LAB;  Service: Cardiovascular;  Laterality: N/A;  . TONSILLECTOMY AND ADENOIDECTOMY    . VESICOVAGINAL FISTULA CLOSURE W/ TAH      There were no vitals filed for this visit.  Subjective Assessment - 03/24/18 1524    Subjective  Patient reports no pain, stumbles or falls since last session. L hip pain has improved. No pain upon arrival. No specific questions or concerns.     Pertinent History  Pt presents with reports of imbalance and BLE weakness. Pt reports that she had HHPT ~1 year ago and since has become more weak.  Pt reports 6 falls in the past 6 months.  Pt reports she loses her balance due to her RLE giving way, causing her to fall.  Pt with h/o stroke but denies it affecting one side more than the other.  Pt with h/o lung cancer with last treatment ~2 years ago.  Pt reports 8/10 LBP which she attributes to falls in the past.  Pt is able to ascend/descend steps but this is challenging.   Pt has a ramp but she still goes up the steps.  Husband does the driving, grocery shopping.  Pt helps with cooking and cleaning.  Pt independent with showering and dressing.  Pt ambulates with her SPC at all times.  Pt checks her BP, pulse, O2, blood glucose each day.  She also checks  the bottom of her feet 1x/wk due to her peripheral neuropathy (addressed this session to check 1x/day).     How long can you sit comfortably?  no issues    How long can you stand comfortably?  10 minutes    How long can you walk comfortably?  household distances    Patient Stated Goals  Pt would like to be able to walk farther, no falls, up and down steps    Currently in Pain?  No/denies          TREATMENT  Ther-ex  NuStep L2 x 5 minute for warm-up during history (4 minutes unbilled); Repeated TUG practice without UE support during sit to stand but use of cane, ambulate with hurrycane around cone 10 ft away and return to sitting. 6x  Seated hamstring stretch on 6" step 2 x 30 second holds Mini squats in // bars x 10;  Neuromuscular Re-education  6" step ups decreasing UE support to no UE support alternating LE x 10 each; 6" hurdle step alternating LE x 10 each side; Reaching inside and outside BOS tapping balloon while standing on Airex x 2 minutes    Pt educated throughout session about proper posture and technique with exercises. Improved exercise technique, movement at target joints, use of target muscles after min to mod verbal, visual, tactile cues.   Pt continues to tolerate increase in intensity and challenge of exercise. She struggles with reaching outside of her BOS especially when on uneven surfaces. Single leg balance remains considerably impaired as well. Patient will continue therapy to work on her balance and strength.Pt will benefit from continued skilled PT interventions for improved independence with functional activities.                     PT Short Term Goals - 02/28/18 1359      PT SHORT TERM GOAL #1   Title  Pt will independently complete HEP at least 4 days/wk for improved carryover between sessions    Baseline  reports compliance with HEP, 01/24/18: performing HEP daily    Time  2    Period  Weeks    Status  Achieved      PT SHORT  TERM GOAL #2   Title  Pt will inspect her feet at least 1x/day for proper foot care due to peripheral neuropathy    Baseline  increasing frequency; 01/24/18: performs daily; 02/28/18: performs daily    Time  2    Period  Weeks    Status  Achieved        PT Long Term Goals - 02/28/18 1359      PT LONG TERM GOAL #1   Title  Pt's ABC scale will improve to at least 50% to demonstrate decreased perception of imbalance    Baseline  20%; 8/15: 45%; 9/26: 57.5%; 01/24/18: 55%; 02/28/18: 25.63%    Time  8    Period  Weeks    Status  On-going    Target Date  04/25/18      PT LONG TERM GOAL #2   Title  Pt's TUG time (with SPC) will decrease to less than or equal to 20 seconds to demonstrate improved mobility    Baseline  32 seconds; 8/15: 31 seconds; 9/26: 18.09 sec; 01/24/18: 21.1s; 02/28/18: 18.7s    Time  8    Period  Weeks    Status  Achieved    Target Date  01/21/18      PT LONG TERM GOAL #3   Title  Pt's Berg Balance Test will improve to at least 42/56 to demonstrate decreased risk of falling    Baseline  23/56; 8/15: 33/56; 9/26: 40/56; 01/24/18: 48/56; 02/28/18: 41/56    Time  8    Period  Weeks    Status  Achieved    Target Date  04/25/18      PT LONG TERM GOAL #4   Title  Pt's 48mWT (with SPC) will improve to at least 0.6 m/s to demonstrate improved gait speed in the home in community    Baseline  0.26 m/s; 8/15: .42 m/s with AD; 9/26: 0.54 m/s; 01/24/18: self-selected: 35.0s = 0.29 m/s, fastest: 21.7s=0.46 m/s; 02/28/18: self-selected: 32.0s = 0.31 m/s, fastest: 16.8s=0.60 m/s    Time  8    Period  Weeks    Status  Achieved      PT LONG TERM GOAL #5   Title  Pt will demonstrate ability to perform sit<>stand x1 without using UE support for improved QOL    Baseline  Pt requires at least 1UE support; 01/24/18: Able to perform without UE support    Time  4    Period  Weeks    Status  Achieved      PT LONG TERM GOAL #6   Title  Pt will improve BERG by at least 3 points  (51/56) in order to demonstrate clinically significant improvement in balance.      Baseline  23/56; 8/15: 33/56; 9/26: 40/56; 01/24/18: 48/56; 02/28/18: 41/56    Time  6    Period  Weeks    Status  On-going    Target Date  04/25/18            Plan - 03/24/18 1525    Clinical Impression Statement  Pt continues to tolerate increase in intensity and challenge of exercise. She struggles with reaching outside of her BOS especially when on uneven surfaces. Single leg balance remains considerably impaired as well. Patient will continue therapy to work on her balance and strength.Pt will benefit from continued skilled PT interventions for improved independence with functional activities.    Rehab Potential  Good    PT Frequency  2x / week    PT Duration  6 weeks    PT Treatment/Interventions  ADLs/Self Care Home Management;Aquatic Therapy;Cryotherapy;Electrical Stimulation;Iontophoresis 4mg /ml Dexamethasone;Moist Heat;Traction;Ultrasound;DME Instruction;Gait training;Stair training;Functional mobility training;Therapeutic activities;Therapeutic exercise;Balance training;Neuromuscular re-education;Patient/family education;Orthotic Fit/Training;Wheelchair mobility training;Manual techniques;Compression bandaging;Passive range of motion;Dry needling;Energy conservation;Taping    PT Next Visit Plan  Progress HEP as needed and continue with strength and balance training, gait training    PT Home Exercise Plan  seated marching, LAQ    Consulted and Agree with  Plan of Care  Patient       Patient will benefit from skilled therapeutic intervention in order to improve the following deficits and impairments:  Abnormal gait, Decreased activity tolerance, Decreased balance, Decreased endurance, Decreased knowledge of use of DME, Decreased mobility, Decreased range of motion, Decreased safety awareness, Decreased strength, Difficulty walking, Hypomobility, Increased fascial restricitons, Impaired perceived  functional ability, Impaired flexibility, Increased muscle spasms, Impaired sensation, Improper body mechanics, Postural dysfunction, Pain  Visit Diagnosis: Unsteadiness on feet  Muscle weakness (generalized)     Problem List Patient Active Problem List   Diagnosis Date Noted  . Malnutrition of moderate degree 03/31/2017  . Acute respiratory failure with hypoxia ([place]) 03/30/2017  . AKI (acute kidney injury) ([place]) 07/15/2016  . Protein-calorie malnutrition, severe 02/08/2016  . Primary cancer of right upper lobe of lung ([place]) 11/20/2015  . Abnormal CT lung screening 10/17/2015  . Personal history of tobacco use, presenting hazards to health 10/15/2015  . Chronic vulvitis 09/26/2014  . Allergic reaction 09/26/2014  . Carotid stenosis 08/30/2014  . Cervical nerve root disorder 08/10/2014  . CAD in native artery 08/10/2014  . B12 deficiency 08/10/2014  . Back ache 08/10/2014  . Bronchitis, chronic ([place]) 08/10/2014  . Diabetes mellitus with polyneuropathy ([place]) 08/10/2014  . Can't get food down 08/10/2014  . Eczema of external ear 08/10/2014  . Accumulation of fluid in tissues 08/10/2014  . Gout 08/10/2014  . Adult hypothyroidism 08/10/2014  . Mononeuritis 08/10/2014  . Muscle ache 08/10/2014  . Disorder of peripheral nervous system 08/10/2014  . Lesion of vulva 08/10/2014  . Cervical spondylosis with radiculopathy 10/19/2013  . COPD exacerbation ([place]) 03/29/2013  . CAD (coronary artery disease) 06/22/2011  . COPD (chronic obstructive pulmonary disease) with emphysema ([place]) 03/07/2010  . CHEST PAIN UNSPECIFIED 07/22/2009  . HLD (hyperlipidemia) 04/01/2009  . Malaise and fatigue 04/01/2009  . Hyperlipidemia 01/18/2009  . TOBACCO ABUSE 01/18/2009  . HYPERTENSION, BENIGN 01/18/2009  . CLAUDICATION 01/18/2009  . Pain in limb 01/18/2009  . CAFL (chronic airflow limitation) ([place]) 01/20/2007  . Late effects of cerebrovascular disease 01/10/2007  . Essential (primary)  hypertension 12/22/2006   Phillips Grout PT, DPT, GCS  Huprich,Jason 03/25/2018, 10:09 PM  [place] MAIN [facility] SERVICES [address] [place], [place], [address] Phone: [phone]   Fax:  [phone]  Name: [name] [name] MRN: [id number] Date of Birth: 1941/01/27

## 2018-03-25 DIAGNOSIS — J449 Chronic obstructive pulmonary disease, unspecified: Secondary | ICD-10-CM | POA: Diagnosis not present

## 2018-03-25 DIAGNOSIS — R0602 Shortness of breath: Secondary | ICD-10-CM | POA: Diagnosis not present

## 2018-03-27 DIAGNOSIS — J449 Chronic obstructive pulmonary disease, unspecified: Secondary | ICD-10-CM | POA: Diagnosis not present

## 2018-03-28 ENCOUNTER — Ambulatory Visit: Payer: Medicare Other

## 2018-03-28 DIAGNOSIS — R2681 Unsteadiness on feet: Secondary | ICD-10-CM

## 2018-03-28 DIAGNOSIS — R2689 Other abnormalities of gait and mobility: Secondary | ICD-10-CM | POA: Diagnosis not present

## 2018-03-28 DIAGNOSIS — M6281 Muscle weakness (generalized): Secondary | ICD-10-CM | POA: Diagnosis not present

## 2018-03-28 NOTE — Therapy (Signed)
[place] MAIN [facility] SERVICES [address] [place], [place], [address] Phone: [phone]   Fax:  [phone]  Physical Therapy Treatment  Patient Details  Name: [name] [name] MRN: [id number] Date of Birth: 03-04-1941 Referring Provider (PT): Jerrol Banana, MD   Encounter Date: 03/28/2018  PT End of Session - 03/28/18 1521    Visit Number  35    Number of Visits  7    Date for PT Re-Evaluation  05/23/18    Authorization Type  9/10 progress note,    Authorization Time Period  Last goals: 02/28/18    PT Start Time  1515    PT Stop Time  1559    PT Time Calculation (min)  44 min    Equipment Utilized During Treatment  Gait belt    Activity Tolerance  Patient tolerated treatment well    Behavior During Therapy  [facility] for tasks assessed/performed       Past Medical History:  Diagnosis Date  . Abnormal CT lung screening 10/17/2015  . COPD (chronic obstructive pulmonary disease) ([place])   . Coronary artery disease, non-occlusive    a. cath 2006: min nonobs CAD; b. cath 12/2010: cath LAD 50%, RCA 60%; c. 08/2013: Minimal luminal irregs, right dominant system with no significant CAD, diffuse luminal irregs noted. Normal EF 55%, no AS or MS.   . Diabetes mellitus   . Hyperlipemia    Followed by Dr. Rosanna Randy  . Hypertension   . Lung cancer ([place])   . Macular degeneration    rt  . Personal history of tobacco use, presenting hazards to health 10/15/2015  . Pneumonia    hx  . Shortness of breath   . Stroke [facility])     Past Surgical History:  Procedure Laterality Date  . ABDOMINAL HYSTERECTOMY    . ANTERIOR CERVICAL DECOMP/DISCECTOMY FUSION N/A 10/19/2013   Procedure: CERVICAL FIVE-SIX ANTERIOR CERVICAL DECOMPRESSION WITH FUSION INTERBODY PROSTHESIS PLATING AND PEEK CAGE;  Surgeon: Ophelia Charter, MD;  Location: [place] NEURO ORS;  Service: Neurosurgery;  Laterality: N/A;  . BACK SURGERY  80's  . BREAST CYST EXCISION Left    left negative    . CARDIAC CATHETERIZATION  05/2004  . CATARACT EXTRACTION Left   . CHOLECYSTECTOMY    . ENDARTERECTOMY Left 10/24/2014   Procedure: ENDARTERECTOMY CAROTID;  Surgeon: Algernon Huxley, MD;  Location: [facility] ORS;  Service: Vascular;  Laterality: Left;  . ENDOBRONCHIAL ULTRASOUND N/A 11/14/2015   Procedure: ENDOBRONCHIAL ULTRASOUND;  Surgeon: [name], MD;  Location: [facility] ORS;  Service: Cardiopulmonary;  Laterality: N/A;  . PERIPHERAL VASCULAR CATHETERIZATION N/A 12/04/2015   Procedure: Glori Luis Cath Insertion;  Surgeon: Algernon Huxley, MD;  Location: [place] CV LAB;  Service: Cardiovascular;  Laterality: N/A;  . PORTA CATH REMOVAL N/A 05/17/2017   Procedure: PORTA CATH REMOVAL;  Surgeon: Algernon Huxley, MD;  Location: [place] CV LAB;  Service: Cardiovascular;  Laterality: N/A;  . TONSILLECTOMY AND ADENOIDECTOMY    . VESICOVAGINAL FISTULA CLOSURE W/ TAH      There were no vitals filed for this visit.  Subjective Assessment - 03/28/18 1520    Subjective  Patient reports no pain, hasn't had a stumble or fall since last session. HEP compliance    Pertinent History  Pt presents with reports of imbalance and BLE weakness. Pt reports that she had HHPT ~1 year ago and since has become more weak.  Pt reports 6 falls in the past 6 months.  Pt reports she loses her balance due to her RLE giving way, causing her to fall.  Pt with h/o stroke but denies it affecting one side more than the other.  Pt with h/o lung cancer with last treatment ~2 years ago.  Pt reports 8/10 LBP which she attributes to falls in the past.  Pt is able to ascend/descend steps but this is challenging.   Pt has a ramp but she still goes up the steps.  Husband does the driving, grocery shopping.  Pt helps with cooking and cleaning.  Pt independent with showering and dressing.  Pt ambulates with her SPC at all times.  Pt checks her BP, pulse, O2, blood glucose each day.  She also checks the bottom of her feet 1x/wk due to her peripheral  neuropathy (addressed this session to check 1x/day).     How long can you sit comfortably?  no issues    How long can you stand comfortably?  10 minutes    How long can you walk comfortably?  household distances    Patient Stated Goals  Pt would like to be able to walk farther, no falls, up and down steps    Currently in Pain?  No/denies             TREATMENT   Ther-ex  NuStep L3 x 4 minute for cardiovascular support keeping SPM>60; Seated hamstring stretch on 6" step 2 x 30 second holds Mini squats in // bars x 10; Seated PF RTB 10x each LE    Neuromuscular Re-education  6" step ups decreasing UE support to no UE support alternating LE x 10 each; 6" hurdle step alternating LE x 10 each side; Reaching inside and outside BOS tapping balloon while standing on Airex x 2 minutes  Side step over and back orange hurdle 10x each LE, occasional SUE support  Airex pad: modified tandem stance: one foot on each airex pad: 2x 30 seconds each LE back   Pt educated throughout session about proper posture and technique with exercises. Improved exercise technique, movement at target joints, use of target muscles after min to mod verbal, visual, tactile cues.                    PT Education - 03/28/18 1521    Education Details  exercise technique, mobility     Person(s) Educated  Patient    Methods  Explanation;Demonstration;Tactile cues;Verbal cues    Comprehension  Verbalized understanding;Returned demonstration;Tactile cues required;Need further instruction;Verbal cues required       PT Short Term Goals - 02/28/18 1359      PT SHORT TERM GOAL #1   Title  Pt will independently complete HEP at least 4 days/wk for improved carryover between sessions    Baseline  reports compliance with HEP, 01/24/18: performing HEP daily    Time  2    Period  Weeks    Status  Achieved      PT SHORT TERM GOAL #2   Title  Pt will inspect her feet at least 1x/day for proper foot care due  to peripheral neuropathy    Baseline  increasing frequency; 01/24/18: performs daily; 02/28/18: performs daily    Time  2    Period  Weeks    Status  Achieved        PT Long Term Goals - 02/28/18 1359      PT LONG TERM GOAL #1   Title  Pt's ABC scale will improve to  at least 50% to demonstrate decreased perception of imbalance    Baseline  20%; 8/15: 45%; 9/26: 57.5%; 01/24/18: 55%; 02/28/18: 25.63%    Time  8    Period  Weeks    Status  On-going    Target Date  04/25/18      PT LONG TERM GOAL #2   Title  Pt's TUG time (with SPC) will decrease to less than or equal to 20 seconds to demonstrate improved mobility    Baseline  32 seconds; 8/15: 31 seconds; 9/26: 18.09 sec; 01/24/18: 21.1s; 02/28/18: 18.7s    Time  8    Period  Weeks    Status  Achieved    Target Date  01/21/18      PT LONG TERM GOAL #3   Title  Pt's Berg Balance Test will improve to at least 42/56 to demonstrate decreased risk of falling    Baseline  23/56; 8/15: 33/56; 9/26: 40/56; 01/24/18: 48/56; 02/28/18: 41/56    Time  8    Period  Weeks    Status  Achieved    Target Date  04/25/18      PT LONG TERM GOAL #4   Title  Pt's 85mWT (with SPC) will improve to at least 0.6 m/s to demonstrate improved gait speed in the home in community    Baseline  0.26 m/s; 8/15: .42 m/s with AD; 9/26: 0.54 m/s; 01/24/18: self-selected: 35.0s = 0.29 m/s, fastest: 21.7s=0.46 m/s; 02/28/18: self-selected: 32.0s = 0.31 m/s, fastest: 16.8s=0.60 m/s    Time  8    Period  Weeks    Status  Achieved      PT LONG TERM GOAL #5   Title  Pt will demonstrate ability to perform sit<>stand x1 without using UE support for improved QOL    Baseline  Pt requires at least 1UE support; 01/24/18: Able to perform without UE support    Time  4    Period  Weeks    Status  Achieved      PT LONG TERM GOAL #6   Title  Pt will improve BERG by at least 3 points (51/56) in order to demonstrate clinically significant improvement in balance.       Baseline  23/56; 8/15: 33/56; 9/26: 40/56; 01/24/18: 48/56; 02/28/18: 41/56    Time  6    Period  Weeks    Status  On-going    Target Date  04/25/18            Plan - 03/28/18 1542    Clinical Impression Statement  Patient able to perform ankle reactions to increase stability on unstable surfaces. Patient is challenged with prolonged activation of muscle contraction due to poor capacity for mobility. Patient will continue therapy to work on her balance and strength.Pt will benefit from continued skilled PT interventions for improved independence with functional activities.    Rehab Potential  Good    PT Frequency  2x / week    PT Duration  6 weeks    PT Treatment/Interventions  ADLs/Self Care Home Management;Aquatic Therapy;Cryotherapy;Electrical Stimulation;Iontophoresis 4mg /ml Dexamethasone;Moist Heat;Traction;Ultrasound;DME Instruction;Gait training;Stair training;Functional mobility training;Therapeutic activities;Therapeutic exercise;Balance training;Neuromuscular re-education;Patient/family education;Orthotic Fit/Training;Wheelchair mobility training;Manual techniques;Compression bandaging;Passive range of motion;Dry needling;Energy conservation;Taping    PT Next Visit Plan  Progress HEP as needed and continue with strength and balance training, gait training    PT Home Exercise Plan  seated marching, LAQ    Consulted and Agree with Plan of Care  Patient  Patient will benefit from skilled therapeutic intervention in order to improve the following deficits and impairments:  Abnormal gait, Decreased activity tolerance, Decreased balance, Decreased endurance, Decreased knowledge of use of DME, Decreased mobility, Decreased range of motion, Decreased safety awareness, Decreased strength, Difficulty walking, Hypomobility, Increased fascial restricitons, Impaired perceived functional ability, Impaired flexibility, Increased muscle spasms, Impaired sensation, Improper body mechanics,  Postural dysfunction, Pain  Visit Diagnosis: Unsteadiness on feet  Muscle weakness (generalized)  Other abnormalities of gait and mobility     Problem List Patient Active Problem List   Diagnosis Date Noted  . Malnutrition of moderate degree 03/31/2017  . Acute respiratory failure with hypoxia ([place]) 03/30/2017  . AKI (acute kidney injury) ([place]) 07/15/2016  . Protein-calorie malnutrition, severe 02/08/2016  . Primary cancer of right upper lobe of lung ([place]) 11/20/2015  . Abnormal CT lung screening 10/17/2015  . Personal history of tobacco use, presenting hazards to health 10/15/2015  . Chronic vulvitis 09/26/2014  . Allergic reaction 09/26/2014  . Carotid stenosis 08/30/2014  . Cervical nerve root disorder 08/10/2014  . CAD in native artery 08/10/2014  . B12 deficiency 08/10/2014  . Back ache 08/10/2014  . Bronchitis, chronic ([place]) 08/10/2014  . Diabetes mellitus with polyneuropathy ([place]) 08/10/2014  . Can't get food down 08/10/2014  . Eczema of external ear 08/10/2014  . Accumulation of fluid in tissues 08/10/2014  . Gout 08/10/2014  . Adult hypothyroidism 08/10/2014  . Mononeuritis 08/10/2014  . Muscle ache 08/10/2014  . Disorder of peripheral nervous system 08/10/2014  . Lesion of vulva 08/10/2014  . Cervical spondylosis with radiculopathy 10/19/2013  . COPD exacerbation ([place]) 03/29/2013  . CAD (coronary artery disease) 06/22/2011  . COPD (chronic obstructive pulmonary disease) with emphysema ([place]) 03/07/2010  . CHEST PAIN UNSPECIFIED 07/22/2009  . HLD (hyperlipidemia) 04/01/2009  . Malaise and fatigue 04/01/2009  . Hyperlipidemia 01/18/2009  . TOBACCO ABUSE 01/18/2009  . HYPERTENSION, BENIGN 01/18/2009  . CLAUDICATION 01/18/2009  . Pain in limb 01/18/2009  . CAFL (chronic airflow limitation) ([place]) 01/20/2007  . Late effects of cerebrovascular disease 01/10/2007  . Essential (primary) hypertension 12/22/2006   Janna Arch, PT, DPT   03/28/2018, 4:02  PM  [place] MAIN [facility] SERVICES [address] [place], [place], [address] Phone: [phone]   Fax:  [phone]  Name: [name] [name] MRN: [id number] Date of Birth: 12-10-1940

## 2018-03-31 ENCOUNTER — Ambulatory Visit: Payer: Medicare Other | Admitting: Physical Therapy

## 2018-03-31 ENCOUNTER — Encounter: Payer: Self-pay | Admitting: Physical Therapy

## 2018-03-31 DIAGNOSIS — R2689 Other abnormalities of gait and mobility: Secondary | ICD-10-CM

## 2018-03-31 DIAGNOSIS — R2681 Unsteadiness on feet: Secondary | ICD-10-CM

## 2018-03-31 DIAGNOSIS — M6281 Muscle weakness (generalized): Secondary | ICD-10-CM

## 2018-03-31 NOTE — Therapy (Signed)
[place] MAIN [facility] SERVICES [address] [place], [place], [address] Phone: [phone]   Fax:  [phone]  Physical Therapy Treatment   Physical Therapy Progress Note   Dates of reporting period  02/28/18   to   03/31/18  Patient Details  Name: [name] [name] MRN: [id number] Date of Birth: 1941-04-01 Referring Provider (PT): Jerrol Banana, MD   Encounter Date: 03/31/2018  PT End of Session - 03/31/18 1304    Visit Number  36    Number of Visits  41    Date for PT Re-Evaluation  05/23/18    Authorization Type  10/10 progress note,    Authorization Time Period  Last goals: 03/31/18    PT Start Time  0100    PT Stop Time  0145    PT Time Calculation (min)  45 min    Equipment Utilized During Treatment  Gait belt    Activity Tolerance  Patient tolerated treatment well    Behavior During Therapy  WFL for tasks assessed/performed       Past Medical History:  Diagnosis Date  . Abnormal CT lung screening 10/17/2015  . COPD (chronic obstructive pulmonary disease) ([place])   . Coronary artery disease, non-occlusive    a. cath 2006: min nonobs CAD; b. cath 12/2010: cath LAD 50%, RCA 60%; c. 08/2013: Minimal luminal irregs, right dominant system with no significant CAD, diffuse luminal irregs noted. Normal EF 55%, no AS or MS.   . Diabetes mellitus   . Hyperlipemia    Followed by Dr. Rosanna Randy  . Hypertension   . Lung cancer ([place])   . Macular degeneration    rt  . Personal history of tobacco use, presenting hazards to health 10/15/2015  . Pneumonia    hx  . Shortness of breath   . Stroke [facility])     Past Surgical History:  Procedure Laterality Date  . ABDOMINAL HYSTERECTOMY    . ANTERIOR CERVICAL DECOMP/DISCECTOMY FUSION N/A 10/19/2013   Procedure: CERVICAL FIVE-SIX ANTERIOR CERVICAL DECOMPRESSION WITH FUSION INTERBODY PROSTHESIS PLATING AND PEEK CAGE;  Surgeon: Ophelia Charter, MD;  Location: [place] NEURO ORS;  Service: Neurosurgery;   Laterality: N/A;  . BACK SURGERY  80's  . BREAST CYST EXCISION Left    left negative   . CARDIAC CATHETERIZATION  05/2004  . CATARACT EXTRACTION Left   . CHOLECYSTECTOMY    . ENDARTERECTOMY Left 10/24/2014   Procedure: ENDARTERECTOMY CAROTID;  Surgeon: Algernon Huxley, MD;  Location: [facility] ORS;  Service: Vascular;  Laterality: Left;  . ENDOBRONCHIAL ULTRASOUND N/A 11/14/2015   Procedure: ENDOBRONCHIAL ULTRASOUND;  Surgeon: [name], MD;  Location: [facility] ORS;  Service: Cardiopulmonary;  Laterality: N/A;  . PERIPHERAL VASCULAR CATHETERIZATION N/A 12/04/2015   Procedure: Glori Luis Cath Insertion;  Surgeon: Algernon Huxley, MD;  Location: [place] CV LAB;  Service: Cardiovascular;  Laterality: N/A;  . PORTA CATH REMOVAL N/A 05/17/2017   Procedure: PORTA CATH REMOVAL;  Surgeon: Algernon Huxley, MD;  Location: [place] CV LAB;  Service: Cardiovascular;  Laterality: N/A;  . TONSILLECTOMY AND ADENOIDECTOMY    . VESICOVAGINAL FISTULA CLOSURE W/ TAH      There were no vitals filed for this visit.  Subjective Assessment - 03/31/18 1303    Subjective  Patient reports no pain, hasn't had a stumble or fall since last session. HEP compliance    Pertinent History  Pt presents with reports of imbalance and BLE weakness. Pt reports that she had  HHPT ~1 year ago and since has become more weak.  Pt reports 6 falls in the past 6 months.  Pt reports she loses her balance due to her RLE giving way, causing her to fall.  Pt with h/o stroke but denies it affecting one side more than the other.  Pt with h/o lung cancer with last treatment ~2 years ago.  Pt reports 8/10 LBP which she attributes to falls in the past.  Pt is able to ascend/descend steps but this is challenging.   Pt has a ramp but she still goes up the steps.  Husband does the driving, grocery shopping.  Pt helps with cooking and cleaning.  Pt independent with showering and dressing.  Pt ambulates with her SPC at all times.  Pt checks her BP, pulse, O2, blood  glucose each day.  She also checks the bottom of her feet 1x/wk due to her peripheral neuropathy (addressed this session to check 1x/day).     How long can you sit comfortably?  no issues    How long can you stand comfortably?  10 minutes    How long can you walk comfortably?  household distances    Patient Stated Goals  Pt would like to be able to walk farther, no falls, up and down steps    Currently in Pain?  No/denies    Pain Score  0-No pain    Pain Onset  1 to 4 weeks ago    Multiple Pain Sites  No    Pain Onset  Yesterday       Treatment: Performed Berg balance test 41/56 ABC scale to review goals and write progress note  Neuromuscular Re-education  Toe taps to 6" step without UE support alternating LE x15 each  Normal stance on Airex pad 2x60sec (requires minA for falls recovery 2 times)?  Forward and backward stepping over hurdle x15 each direction  SIde stepping over hurdle 15x each direction  CGA and  mod verbal cues used throughout with increased in postural sway and LOB most seen with narrow base of support and while on uneven surfaces. Continues to have balance deficits typical with diagnosis. Patient performs intermediate level exercises without pain behaviors and needs verbal cuing for postural alignment and head positioning                     PT Education - 03/31/18 1303    Education Details  HEP    Person(s) Educated  Patient    Methods  Explanation    Comprehension  Verbalized understanding;Need further instruction       PT Short Term Goals - 02/28/18 1359      PT SHORT TERM GOAL #1   Title  Pt will independently complete HEP at least 4 days/wk for improved carryover between sessions    Baseline  reports compliance with HEP, 01/24/18: performing HEP daily    Time  2    Period  Weeks    Status  Achieved      PT SHORT TERM GOAL #2   Title  Pt will inspect her feet at least 1x/day for proper foot care due to peripheral neuropathy     Baseline  increasing frequency; 01/24/18: performs daily; 02/28/18: performs daily    Time  2    Period  Weeks    Status  Achieved        PT Long Term Goals - 03/31/18 1305      PT LONG TERM GOAL #1  Title  Pt's ABC scale will improve to at least 50% to demonstrate decreased perception of imbalance    Baseline  20%; 8/15: 45%; 9/26: 57.5%; 01/24/18: 55%; 02/28/18: 25.63%, 03/31/18=31%    Time  8    Period  Weeks    Status  On-going    Target Date  05/23/18      PT LONG TERM GOAL #2   Title  Pt's TUG time (with SPC) will decrease to less than or equal to 20 seconds to demonstrate improved mobility    Baseline  32 seconds; 8/15: 31 seconds; 9/26: 18.09 sec; 01/24/18: 21.1s; 02/28/18: 18.7s    Time  8    Period  Weeks    Status  Achieved      PT LONG TERM GOAL #3   Title  Pt's Berg Balance Test will improve to at least 42/56 to demonstrate decreased risk of falling    Baseline  23/56; 8/15: 33/56; 9/26: 40/56; 01/24/18: 48/56; 02/28/18: 41/56    Time  8    Period  Weeks    Status  Achieved      PT LONG TERM GOAL #4   Title  Pt's 70mWT (with SPC) will improve to at least 0.6 m/s to demonstrate improved gait speed in the home in community    Baseline  0.26 m/s; 8/15: .42 m/s with AD; 9/26: 0.54 m/s; 01/24/18: self-selected: 35.0s = 0.29 m/s, fastest: 21.7s=0.46 m/s; 02/28/18: self-selected: 32.0s = 0.31 m/s, fastest: 16.8s=0.60 m/s    Time  8    Period  Weeks    Status  Achieved      PT LONG TERM GOAL #5   Title  Pt will demonstrate ability to perform sit<>stand x1 without using UE support for improved QOL    Baseline  Pt requires at least 1UE support; 01/24/18: Able to perform without UE support    Time  4    Period  Weeks    Status  Achieved      PT LONG TERM GOAL #6   Title  Pt will improve BERG by at least 3 points (51/56) in order to demonstrate clinically significant improvement in balance.      Baseline  23/56; 8/15: 33/56; 9/26: 40/56; 01/24/18: 48/56; 02/28/18:  41/56: 03/31/18=41/56    Time  6    Period  Weeks    Status  On-going    Target Date  05/23/18            Plan - 03/31/18 1317    Clinical Impression Statement  Patient's condition has the potential to improve in response to therapy. Maximum improvement is yet to be obtained. The anticipated improvement is attainable and reasonable in a generally predictable time.  Patient reports Patient performs berg balance test and ABC scale, with progress made towards ABC and Berg balance remained the same. She performs static and dynamic standing balance exercises with CGA.  She will benefit from continued skilled PT to improve balance and improve falls risk.    Rehab Potential  Good    PT Frequency  2x / week    PT Duration  6 weeks    PT Treatment/Interventions  ADLs/Self Care Home Management;Aquatic Therapy;Cryotherapy;Electrical Stimulation;Iontophoresis 4mg /ml Dexamethasone;Moist Heat;Traction;Ultrasound;DME Instruction;Gait training;Stair training;Functional mobility training;Therapeutic activities;Therapeutic exercise;Balance training;Neuromuscular re-education;Patient/family education;Orthotic Fit/Training;Wheelchair mobility training;Manual techniques;Compression bandaging;Passive range of motion;Dry needling;Energy conservation;Taping    PT Next Visit Plan  Progress HEP as needed and continue with strength and balance training, gait training    PT Home Exercise Plan  seated marching, LAQ    Consulted and Agree with Plan of Care  Patient       Patient will benefit from skilled therapeutic intervention in order to improve the following deficits and impairments:  Abnormal gait, Decreased activity tolerance, Decreased balance, Decreased endurance, Decreased knowledge of use of DME, Decreased mobility, Decreased range of motion, Decreased safety awareness, Decreased strength, Difficulty walking, Hypomobility, Increased fascial restricitons, Impaired perceived functional ability, Impaired  flexibility, Increased muscle spasms, Impaired sensation, Improper body mechanics, Postural dysfunction, Pain  Visit Diagnosis: Unsteadiness on feet  Muscle weakness (generalized)  Other abnormalities of gait and mobility     Problem List Patient Active Problem List   Diagnosis Date Noted  . Malnutrition of moderate degree 03/31/2017  . Acute respiratory failure with hypoxia ([place]) 03/30/2017  . AKI (acute kidney injury) ([place]) 07/15/2016  . Protein-calorie malnutrition, severe 02/08/2016  . Primary cancer of right upper lobe of lung ([place]) 11/20/2015  . Abnormal CT lung screening 10/17/2015  . Personal history of tobacco use, presenting hazards to health 10/15/2015  . Chronic vulvitis 09/26/2014  . Allergic reaction 09/26/2014  . Carotid stenosis 08/30/2014  . Cervical nerve root disorder 08/10/2014  . CAD in native artery 08/10/2014  . B12 deficiency 08/10/2014  . Back ache 08/10/2014  . Bronchitis, chronic ([place]) 08/10/2014  . Diabetes mellitus with polyneuropathy ([place]) 08/10/2014  . Can't get food down 08/10/2014  . Eczema of external ear 08/10/2014  . Accumulation of fluid in tissues 08/10/2014  . Gout 08/10/2014  . Adult hypothyroidism 08/10/2014  . Mononeuritis 08/10/2014  . Muscle ache 08/10/2014  . Disorder of peripheral nervous system 08/10/2014  . Lesion of vulva 08/10/2014  . Cervical spondylosis with radiculopathy 10/19/2013  . COPD exacerbation ([place]) 03/29/2013  . CAD (coronary artery disease) 06/22/2011  . COPD (chronic obstructive pulmonary disease) with emphysema ([place]) 03/07/2010  . CHEST PAIN UNSPECIFIED 07/22/2009  . HLD (hyperlipidemia) 04/01/2009  . Malaise and fatigue 04/01/2009  . Hyperlipidemia 01/18/2009  . TOBACCO ABUSE 01/18/2009  . HYPERTENSION, BENIGN 01/18/2009  . CLAUDICATION 01/18/2009  . Pain in limb 01/18/2009  . CAFL (chronic airflow limitation) ([place]) 01/20/2007  . Late effects of cerebrovascular disease 01/10/2007  . Essential  (primary) hypertension 12/22/2006    Alanson Puls, [place] DPT 03/31/2018, 1:35 PM  [place] MAIN [facility] SERVICES [address] [place], [place], [address] Phone: [phone]   Fax:  [phone]  Name: [name] [name] MRN: [id number] Date of Birth: January 07, 1941

## 2018-04-04 ENCOUNTER — Ambulatory Visit: Payer: Medicare Other

## 2018-04-04 DIAGNOSIS — R2681 Unsteadiness on feet: Secondary | ICD-10-CM

## 2018-04-04 DIAGNOSIS — R2689 Other abnormalities of gait and mobility: Secondary | ICD-10-CM | POA: Diagnosis not present

## 2018-04-04 DIAGNOSIS — M6281 Muscle weakness (generalized): Secondary | ICD-10-CM | POA: Diagnosis not present

## 2018-04-04 NOTE — Therapy (Signed)
[place] MAIN [facility] SERVICES [address] [place], [place], [address] Phone: [phone]   Fax:  [phone]  Physical Therapy Treatment  Patient Details  Name: [name] [name] MRN: [id number] Date of Birth: [birth year] Referring Provider (PT): Jerrol Banana, MD   Encounter Date: 04/04/2018  PT End of Session - 04/04/18 1427    Visit Number  37    Number of Visits  61    Date for PT Re-Evaluation  05/23/18    Authorization Type  1/10 progress note,    Authorization Time Period  Last goals: 03/31/18    PT Start Time  1430    PT Stop Time  1515    PT Time Calculation (min)  45 min    Equipment Utilized During Treatment  Gait belt    Activity Tolerance  Patient tolerated treatment well    Behavior During Therapy  [facility] for tasks assessed/performed       Past Medical History:  Diagnosis Date  . Abnormal CT lung screening 10/17/2015  . COPD (chronic obstructive pulmonary disease) ([place])   . Coronary artery disease, non-occlusive    a. cath 2006: min nonobs CAD; b. cath 12/2010: cath LAD 50%, RCA 60%; c. 08/2013: Minimal luminal irregs, right dominant system with no significant CAD, diffuse luminal irregs noted. Normal EF 55%, no AS or MS.   . Diabetes mellitus   . Hyperlipemia    Followed by Dr. Rosanna Randy  . Hypertension   . Lung cancer ([place])   . Macular degeneration    rt  . Personal history of tobacco use, presenting hazards to health 10/15/2015  . Pneumonia    hx  . Shortness of breath   . Stroke [facility])     Past Surgical History:  Procedure Laterality Date  . ABDOMINAL HYSTERECTOMY    . ANTERIOR CERVICAL DECOMP/DISCECTOMY FUSION N/A 10/19/2013   Procedure: CERVICAL FIVE-SIX ANTERIOR CERVICAL DECOMPRESSION WITH FUSION INTERBODY PROSTHESIS PLATING AND PEEK CAGE;  Surgeon: Ophelia Charter, MD;  Location: [place] NEURO ORS;  Service: Neurosurgery;  Laterality: N/A;  . BACK SURGERY  80's  . BREAST CYST EXCISION Left    left negative    . CARDIAC CATHETERIZATION  05/2004  . CATARACT EXTRACTION Left   . CHOLECYSTECTOMY    . ENDARTERECTOMY Left 10/24/2014   Procedure: ENDARTERECTOMY CAROTID;  Surgeon: Algernon Huxley, MD;  Location: [facility] ORS;  Service: Vascular;  Laterality: Left;  . ENDOBRONCHIAL ULTRASOUND N/A 11/14/2015   Procedure: ENDOBRONCHIAL ULTRASOUND;  Surgeon: [name], MD;  Location: [facility] ORS;  Service: Cardiopulmonary;  Laterality: N/A;  . PERIPHERAL VASCULAR CATHETERIZATION N/A 12/04/2015   Procedure: Glori Luis Cath Insertion;  Surgeon: Algernon Huxley, MD;  Location: [place] CV LAB;  Service: Cardiovascular;  Laterality: N/A;  . PORTA CATH REMOVAL N/A 05/17/2017   Procedure: PORTA CATH REMOVAL;  Surgeon: Algernon Huxley, MD;  Location: [place] CV LAB;  Service: Cardiovascular;  Laterality: N/A;  . TONSILLECTOMY AND ADENOIDECTOMY    . VESICOVAGINAL FISTULA CLOSURE W/ TAH      There were no vitals filed for this visit.  Subjective Assessment - 04/04/18 1426    Subjective  Patient reports no pain, hasn't had a stumble or fall since last session. HEP compliance    Pertinent History  Pt presents with reports of imbalance and BLE weakness. Pt reports that she had HHPT ~1 year ago and since has become more weak.  Pt reports 6 falls in the past 6 months.  Pt reports she loses her balance due to her RLE giving way, causing her to fall.  Pt with h/o stroke but denies it affecting one side more than the other.  Pt with h/o lung cancer with last treatment ~2 years ago.  Pt reports 8/10 LBP which she attributes to falls in the past.  Pt is able to ascend/descend steps but this is challenging.   Pt has a ramp but she still goes up the steps.  Husband does the driving, grocery shopping.  Pt helps with cooking and cleaning.  Pt independent with showering and dressing.  Pt ambulates with her SPC at all times.  Pt checks her BP, pulse, O2, blood glucose each day.  She also checks the bottom of her feet 1x/wk due to her peripheral  neuropathy (addressed this session to check 1x/day).     How long can you sit comfortably?  no issues    How long can you stand comfortably?  10 minutes    How long can you walk comfortably?  household distances    Patient Stated Goals  Pt would like to be able to walk farther, no falls, up and down steps    Currently in Pain?  No/denies            TREATMENT  Ther-ex  NuStep L4 x 4 minute for cardiovascular support keeping SPM>50, cues required to keep step per minutes elevated above 60; Quantum leg press 120# x 15, x 10; Repeated TUG practice without UE support during sit to stand but use of cane, ambulate with hurrycane around cone 10 ft away and return to sitting. 6x  Seated marches 2 x 10 bilateral; Seated clams and adductor squeezes x 20 each; Sit to stand from chair with Airex pad on seat x 5, regular height chair x 5;   Neuromuscular Re-education  6" step ups decreasing UE support to no UE support alternating LE x 5 each; 6" hurdle step alternating LE x 10 each side; 6" side hurdle step alternating LE x 10 each direction; Reaching inside and outside BOS tapping balloon while standing on Airex x 2 minutes    Pt educated throughout session about proper posture and technique with exercises. Improved exercise technique, movement at target joints, use of target muscles after min to mod verbal, visual, tactile cues.   Pt demonstrates good motivation however fatigues easily and requires repeated rest breaks. She struggles with reaching outside of her BOS especially when on uneven surfaces like the Airex pad. Increased confidence in single leg balance during hurdle steps today. Patient will continue therapyto work on her balance and strength.Pt will benefit from continued skilled PT interventions for improved independence with functional activities.                        PT Short Term Goals - 02/28/18 1359      PT SHORT TERM GOAL #1   Title  Pt  will independently complete HEP at least 4 days/wk for improved carryover between sessions    Baseline  reports compliance with HEP, 01/24/18: performing HEP daily    Time  2    Period  Weeks    Status  Achieved      PT SHORT TERM GOAL #2   Title  Pt will inspect her feet at least 1x/day for proper foot care due to peripheral neuropathy    Baseline  increasing frequency; 01/24/18: performs daily; 02/28/18: performs daily    Time  2    Period  Weeks    Status  Achieved        PT Long Term Goals - 03/31/18 1305      PT LONG TERM GOAL #1   Title  Pt's ABC scale will improve to at least 50% to demonstrate decreased perception of imbalance    Baseline  20%; 8/15: 45%; 9/26: 57.5%; 01/24/18: 55%; 02/28/18: 25.63%, 03/31/18=31%    Time  8    Period  Weeks    Status  On-going    Target Date  05/23/18      PT LONG TERM GOAL #2   Title  Pt's TUG time (with SPC) will decrease to less than or equal to 20 seconds to demonstrate improved mobility    Baseline  32 seconds; 8/15: 31 seconds; 9/26: 18.09 sec; 01/24/18: 21.1s; 02/28/18: 18.7s    Time  8    Period  Weeks    Status  Achieved      PT LONG TERM GOAL #3   Title  Pt's Berg Balance Test will improve to at least 42/56 to demonstrate decreased risk of falling    Baseline  23/56; 8/15: 33/56; 9/26: 40/56; 01/24/18: 48/56; 02/28/18: 41/56    Time  8    Period  Weeks    Status  Achieved      PT LONG TERM GOAL #4   Title  Pt's 22mWT (with SPC) will improve to at least 0.6 m/s to demonstrate improved gait speed in the home in community    Baseline  0.26 m/s; 8/15: .42 m/s with AD; 9/26: 0.54 m/s; 01/24/18: self-selected: 35.0s = 0.29 m/s, fastest: 21.7s=0.46 m/s; 02/28/18: self-selected: 32.0s = 0.31 m/s, fastest: 16.8s=0.60 m/s    Time  8    Period  Weeks    Status  Achieved      PT LONG TERM GOAL #5   Title  Pt will demonstrate ability to perform sit<>stand x1 without using UE support for improved QOL    Baseline  Pt requires at  least 1UE support; 01/24/18: Able to perform without UE support    Time  4    Period  Weeks    Status  Achieved      PT LONG TERM GOAL #6   Title  Pt will improve BERG by at least 3 points (51/56) in order to demonstrate clinically significant improvement in balance.      Baseline  23/56; 8/15: 33/56; 9/26: 40/56; 01/24/18: 48/56; 02/28/18: 41/56: 03/31/18=41/56    Time  6    Period  Weeks    Status  On-going    Target Date  05/23/18            Plan - 04/04/18 1427    Clinical Impression Statement  Pt demonstrates good motivation however fatigues easily and requires repeated rest breaks. She struggles with reaching outside of her BOS especially when on uneven surfaces like the Airex pad. Increased confidence in single leg balance during hurdle steps today. Patient will continue therapyto work on her balance and strength.Pt will benefit from continued skilled PT interventions for improved independence with functional activities.    Rehab Potential  Good    PT Frequency  2x / week    PT Duration  6 weeks    PT Treatment/Interventions  ADLs/Self Care Home Management;Aquatic Therapy;Cryotherapy;Electrical Stimulation;Iontophoresis 4mg /ml Dexamethasone;Moist Heat;Traction;Ultrasound;DME Instruction;Gait training;Stair training;Functional mobility training;Therapeutic activities;Therapeutic exercise;Balance training;Neuromuscular re-education;Patient/family education;Orthotic Fit/Training;Wheelchair mobility training;Manual techniques;Compression bandaging;Passive range of motion;Dry needling;Energy conservation;Taping    PT  Next Visit Plan  Progress HEP as needed and continue with strength and balance training, gait training    PT Home Exercise Plan  seated marching, LAQ    Consulted and Agree with Plan of Care  Patient       Patient will benefit from skilled therapeutic intervention in order to improve the following deficits and impairments:  Abnormal gait, Decreased activity tolerance,  Decreased balance, Decreased endurance, Decreased knowledge of use of DME, Decreased mobility, Decreased range of motion, Decreased safety awareness, Decreased strength, Difficulty walking, Hypomobility, Increased fascial restricitons, Impaired perceived functional ability, Impaired flexibility, Increased muscle spasms, Impaired sensation, Improper body mechanics, Postural dysfunction, Pain  Visit Diagnosis: Unsteadiness on feet  Muscle weakness (generalized)     Problem List Patient Active Problem List   Diagnosis Date Noted  . Malnutrition of moderate degree 03/31/2017  . Acute respiratory failure with hypoxia ([place]) 03/30/2017  . AKI (acute kidney injury) ([place]) 07/15/2016  . Protein-calorie malnutrition, severe 02/08/2016  . Primary cancer of right upper lobe of lung ([place]) 11/20/2015  . Abnormal CT lung screening 10/17/2015  . Personal history of tobacco use, presenting hazards to health 10/15/2015  . Chronic vulvitis 09/26/2014  . Allergic reaction 09/26/2014  . Carotid stenosis 08/30/2014  . Cervical nerve root disorder 08/10/2014  . CAD in native artery 08/10/2014  . B12 deficiency 08/10/2014  . Back ache 08/10/2014  . Bronchitis, chronic ([place]) 08/10/2014  . Diabetes mellitus with polyneuropathy ([place]) 08/10/2014  . Can't get food down 08/10/2014  . Eczema of external ear 08/10/2014  . Accumulation of fluid in tissues 08/10/2014  . Gout 08/10/2014  . Adult hypothyroidism 08/10/2014  . Mononeuritis 08/10/2014  . Muscle ache 08/10/2014  . Disorder of peripheral nervous system 08/10/2014  . Lesion of vulva 08/10/2014  . Cervical spondylosis with radiculopathy 10/19/2013  . COPD exacerbation ([place]) 03/29/2013  . CAD (coronary artery disease) 06/22/2011  . COPD (chronic obstructive pulmonary disease) with emphysema ([place]) 03/07/2010  . CHEST PAIN UNSPECIFIED 07/22/2009  . HLD (hyperlipidemia) 04/01/2009  . Malaise and fatigue 04/01/2009  . Hyperlipidemia 01/18/2009  .  TOBACCO ABUSE 01/18/2009  . HYPERTENSION, BENIGN 01/18/2009  . CLAUDICATION 01/18/2009  . Pain in limb 01/18/2009  . CAFL (chronic airflow limitation) ([place]) 01/20/2007  . Late effects of cerebrovascular disease 01/10/2007  . Essential (primary) hypertension 12/22/2006   Phillips Grout PT, DPT, GCS  Ivaan Liddy 04/04/2018, 4:56 PM  [place] MAIN [facility] SERVICES [address] [place], [place], [address] Phone: [phone]   Fax:  [phone]  Name: [name] [name] MRN: [id number] Date of Birth: 30-Jan-1941

## 2018-04-07 ENCOUNTER — Ambulatory Visit (INDEPENDENT_AMBULATORY_CARE_PROVIDER_SITE_OTHER): Payer: Medicare Other | Admitting: Family Medicine

## 2018-04-07 ENCOUNTER — Ambulatory Visit: Payer: Medicare Other | Attending: Family Medicine

## 2018-04-07 ENCOUNTER — Encounter: Payer: Self-pay | Admitting: Family Medicine

## 2018-04-07 VITALS — BP 147/66 | HR 63 | Temp 97.5°F | Resp 15 | Wt 151.6 lb

## 2018-04-07 DIAGNOSIS — M7022 Olecranon bursitis, left elbow: Secondary | ICD-10-CM | POA: Diagnosis not present

## 2018-04-07 DIAGNOSIS — H6692 Otitis media, unspecified, left ear: Secondary | ICD-10-CM | POA: Diagnosis not present

## 2018-04-07 DIAGNOSIS — R2681 Unsteadiness on feet: Secondary | ICD-10-CM | POA: Diagnosis not present

## 2018-04-07 DIAGNOSIS — M6281 Muscle weakness (generalized): Secondary | ICD-10-CM | POA: Insufficient documentation

## 2018-04-07 DIAGNOSIS — R2689 Other abnormalities of gait and mobility: Secondary | ICD-10-CM | POA: Diagnosis not present

## 2018-04-07 MED ORDER — AMOXICILLIN-POT CLAVULANATE 875-125 MG PO TABS: 1.0000 | ORAL_TABLET | Freq: Two times a day (BID) | ORAL | 0 refills | Status: DC

## 2018-04-07 NOTE — Progress Notes (Signed)
Patient: [name] [name] Female    DOB: 1941/04/02   78 y.o.   MRN: [id number] Visit Date: 04/07/2018  Today's Provider: [name], MD   Chief Complaint  Patient presents with  . Joint Swelling   Subjective:   Patient comes in office today with concerns of swelling to her left elbow for the past 48hrs. Patient does not recall hitting area or having previous injury. Patient reports area is tender, swollen and hot to the touch.  She has no known trauma to the left arm.  She also complains of ongoing drainage from her left ear which is bothering her.   Allergies  Allergen Reactions  . Coconut Fatty Acids Swelling and Other (See Comments)    Throat swells     Current Outpatient Medications:  .  albuterol (PROVENTIL) (5 MG/ML) 0.5% nebulizer solution, Take 0.5 mLs (2.5 mg total) by nebulization every 6 (six) hours as needed for wheezing or shortness of breath. (Patient taking differently: Take 2.5 mg by nebulization daily. ), Disp: 100 vial, Rfl: 0 .  Blood Glucose Monitoring Suppl (ONE TOUCH ULTRA 2) w/Device KIT, 1 each by Does not apply route See admin instructions., Disp: 1 each, Rfl: 0 .  budesonide-formoterol (SYMBICORT) 160-4.5 MCG/ACT inhaler, Inhale 2 puffs into the lungs 2 (two) times daily., Disp: 3 Inhaler, Rfl: 3 .  doxycycline (VIBRA-TABS) 100 MG tablet, Take 1 tablet (100 mg total) by mouth 2 (two) times daily., Disp: 20 tablet, Rfl: 0 .  doxycycline (VIBRA-TABS) 100 MG tablet, Take 1 tablet (100 mg total) by mouth 2 (two) times daily., Disp: 20 tablet, Rfl: 0 .  fluticasone (FLONASE) 50 MCG/ACT nasal spray, Place 2 sprays into both nostrils daily., Disp: 16 g, Rfl: 6 .  gabapentin (NEURONTIN) 600 MG tablet, TAKE ONE-HALF TABLET BY  MOUTH TWO TIMES DAILY, Disp: 90 tablet, Rfl: 3 .  glimepiride (AMARYL) 2 MG tablet, Take 1 tablet (2 mg total) by mouth 2 (two) times daily., Disp: 180 tablet, Rfl: 3 .  HYDROcodone-acetaminophen (NORCO) 5-325 MG tablet, Take 1  tablet by mouth every 6 (six) hours as needed for moderate pain or severe pain., Disp: 20 tablet, Rfl: 0 .  isosorbide mononitrate (IMDUR) 30 MG 24 hr tablet, Take 1 tablet (30 mg total) by mouth daily., Disp: 90 tablet, Rfl: 3 .  LORazepam (ATIVAN) 1 MG tablet, Take 1 tablet (1 mg total) by mouth 2 (two) times daily as needed for anxiety., Disp: 30 tablet, Rfl: 5 .  magnesium oxide (MAG-OX) 400 (241.3 MG) MG tablet, Take 400 mg by mouth 2 (two) times daily. , Disp: , Rfl:  .  metoprolol tartrate (LOPRESSOR) 25 MG tablet, TAKE 1 TABLET BY MOUTH TWO  TIMES DAILY, Disp: 180 tablet, Rfl: 3 .  mometasone (ELOCON) 0.1 % cream, Apply small amount nightly to each ear, Disp: 45 g, Rfl: 0 .  Multiple Vitamins-Minerals (ICAPS) CAPS, Take 1 capsule by mouth 2 (two) times daily. , Disp: , Rfl:  .  nitroGLYCERIN (NITROSTAT) 0.4 MG SL tablet, Place 1 tablet (0.4 mg total) under the tongue every 5 (five) minutes as needed for chest pain., Disp: 25 tablet, Rfl: 3 .  ofloxacin (FLOXIN) 0.3 % OTIC solution, PLACE 10 DROPS INTO THE LEFT EAR DAILY., Disp: 5 mL, Rfl: 0 .  ondansetron (ZOFRAN) 8 MG tablet, Take 1 tablet (8 mg total) by mouth every 8 (eight) hours as needed for nausea or vomiting., Disp: 30 tablet, Rfl: 1 .  ONE  TOUCH ULTRA TEST test strip, USE TO CHECK BLOOD SUGAR ONCE DAILY, Disp: 100 each, Rfl: 11 .  pantoprazole (PROTONIX) 40 MG tablet, TAKE 1 TABLET BY MOUTH  DAILY, Disp: 90 tablet, Rfl: 3 .  triamcinolone cream (KENALOG) 0.1 %, Apply 1 application topically 2 (two) times daily., Disp: 30 g, Rfl: 0 No current facility-administered medications for this visit.   Facility-Administered Medications Ordered in Other Visits:  .  ondansetron (ZOFRAN) 8 mg, dexamethasone (DECADRON) 10 mg in sodium chloride 0.9 % 50 mL IVPB, , Intravenous, Once, Finnegan, Kathlene November, MD  Review of Systems  Constitutional: Negative.   HENT: Positive for ear discharge and ear pain.   Respiratory: Negative.   Endocrine:  Negative.   Musculoskeletal: Positive for arthritis and joint swelling. Negative for arthralgias, back pain, gait problem, myalgias, neck pain and neck stiffness.  Skin: Negative.   Allergic/Immunologic: Negative.   Hematological: Negative.   Psychiatric/Behavioral: Negative.     Social History   Tobacco Use  . Smoking status: Former Smoker    Packs/day: 1.00    Years: 50.00    Pack years: 50.00    Types: Cigarettes    Last attempt to quit: 10/03/2015    Years since quitting: 2.5  . Smokeless tobacco: Never Used  . Tobacco comment: smokes 3 cigs daily 05/06/15. Pt instructed to quit.  Substance Use Topics  . Alcohol use: No    Alcohol/week: 0.0 standard drinks      Objective:   BP (!) 147/66   Pulse 63   Temp (!) 97.5 F (36.4 C) (Oral)   Resp 15   Wt 151 lb 9.6 oz (68.8 kg)   BMI 26.02 kg/m  Vitals:   04/07/18 1517  BP: (!) 147/66  Pulse: 63  Resp: 15  Temp: (!) 97.5 F (36.4 C)  TempSrc: Oral  Weight: 151 lb 9.6 oz (68.8 kg)     Physical Exam Constitutional:      Appearance: Normal appearance.  HENT:     Right Ear: Tympanic membrane normal.     Left Ear: Tympanic membrane normal.     Ears:     Comments: There is yellow-green drainage in the left EAC.  Appears to be deep and probably from the TM.    Nose: Nose normal.  Eyes:     General: No scleral icterus.    Conjunctiva/sclera: Conjunctivae normal.  Cardiovascular:     Rate and Rhythm: Normal rate and regular rhythm.     Heart sounds: Normal heart sounds.  Pulmonary:     Effort: Pulmonary effort is normal.     Breath sounds: Normal breath sounds.  Musculoskeletal:     Comments: Mild swelling of the left olecranon bursa without erythema induration or any significant tenderness.  It is mildly tender.  Lymphadenopathy:     Cervical: No cervical adenopathy.  Neurological:     Mental Status: She is alert.         Assessment & Plan    1. Left otitis media, unspecified otitis media type May  need referral to ENT if not improving. - amoxicillin-clavulanate (AUGMENTIN) 875-125 MG tablet; Take 1 tablet by mouth 2 (two) times daily.  Dispense: 20 tablet; Refill: 0  2. Olecranon bursitis of left elbow Return to clinic 1 week.  May need x-ray then.  May need referral to Ortho.    I have done the exam and reviewed the above chart and it is accurate to the best of my knowledge. Development worker, community has  been used in this note in any air is in the dictation or transcription are unintentional.  [name], MD  [place]

## 2018-04-07 NOTE — Therapy (Addendum)
[place] MAIN [facility] SERVICES [address] [place], [place], [address] Phone: [phone]   Fax:  [phone]  Physical Therapy Treatment  Patient Details  Name: [name] [name] MRN: [id number] Date of Birth: [birth year] Referring Provider (PT): Jerrol Banana, MD   Encounter Date: 04/07/2018  PT End of Session - 04/07/18 1453    Visit Number  38    Number of Visits  67    Date for PT Re-Evaluation  05/23/18    Authorization Type  2/10 progress note,    Authorization Time Period  Last goals: 03/31/18    PT Start Time  1400    PT Stop Time  1445    PT Time Calculation (min)  45 min    Equipment Utilized During Treatment  Gait belt    Activity Tolerance  Patient tolerated treatment well    Behavior During Therapy  [facility] for tasks assessed/performed       Past Medical History:  Diagnosis Date  . Abnormal CT lung screening 10/17/2015  . COPD (chronic obstructive pulmonary disease) ([place])   . Coronary artery disease, non-occlusive    a. cath 2006: min nonobs CAD; b. cath 12/2010: cath LAD 50%, RCA 60%; c. 08/2013: Minimal luminal irregs, right dominant system with no significant CAD, diffuse luminal irregs noted. Normal EF 55%, no AS or MS.   . Diabetes mellitus   . Hyperlipemia    Followed by Dr. Rosanna Randy  . Hypertension   . Lung cancer ([place])   . Macular degeneration    rt  . Personal history of tobacco use, presenting hazards to health 10/15/2015  . Pneumonia    hx  . Shortness of breath   . Stroke [facility])     Past Surgical History:  Procedure Laterality Date  . ABDOMINAL HYSTERECTOMY    . ANTERIOR CERVICAL DECOMP/DISCECTOMY FUSION N/A 10/19/2013   Procedure: CERVICAL FIVE-SIX ANTERIOR CERVICAL DECOMPRESSION WITH FUSION INTERBODY PROSTHESIS PLATING AND PEEK CAGE;  Surgeon: Ophelia Charter, MD;  Location: [place] NEURO ORS;  Service: Neurosurgery;  Laterality: N/A;  . BACK SURGERY  80's  . BREAST CYST EXCISION Left    left negative   .  CARDIAC CATHETERIZATION  05/2004  . CATARACT EXTRACTION Left   . CHOLECYSTECTOMY    . ENDARTERECTOMY Left 10/24/2014   Procedure: ENDARTERECTOMY CAROTID;  Surgeon: Algernon Huxley, MD;  Location: [facility] ORS;  Service: Vascular;  Laterality: Left;  . ENDOBRONCHIAL ULTRASOUND N/A 11/14/2015   Procedure: ENDOBRONCHIAL ULTRASOUND;  Surgeon: [name], MD;  Location: [facility] ORS;  Service: Cardiopulmonary;  Laterality: N/A;  . PERIPHERAL VASCULAR CATHETERIZATION N/A 12/04/2015   Procedure: Glori Luis Cath Insertion;  Surgeon: Algernon Huxley, MD;  Location: [place] CV LAB;  Service: Cardiovascular;  Laterality: N/A;  . PORTA CATH REMOVAL N/A 05/17/2017   Procedure: PORTA CATH REMOVAL;  Surgeon: Algernon Huxley, MD;  Location: [place] CV LAB;  Service: Cardiovascular;  Laterality: N/A;  . TONSILLECTOMY AND ADENOIDECTOMY    . VESICOVAGINAL FISTULA CLOSURE W/ TAH      There were no vitals filed for this visit.  Subjective Assessment - 04/07/18 1357    Subjective  Patient reports no pain, hasn't had a stumble or fall since last session. Compliant with HEP. Pt reports that she has had some swelling on her L elbow since her most recent fall.    Pertinent History  Pt presents with reports of imbalance and BLE weakness. Pt reports that she had HHPT ~1  year ago and since has become more weak.  Pt reports 6 falls in the past 6 months.  Pt reports she loses her balance due to her RLE giving way, causing her to fall.  Pt with h/o stroke but denies it affecting one side more than the other.  Pt with h/o lung cancer with last treatment ~2 years ago.  Pt reports 8/10 LBP which she attributes to falls in the past.  Pt is able to ascend/descend steps but this is challenging.   Pt has a ramp but she still goes up the steps.  Husband does the driving, grocery shopping.  Pt helps with cooking and cleaning.  Pt independent with showering and dressing.  Pt ambulates with her SPC at all times.  Pt checks her BP, pulse, O2, blood  glucose each day.  She also checks the bottom of her feet 1x/wk due to her peripheral neuropathy (addressed this session to check 1x/day).     How long can you sit comfortably?  no issues    How long can you stand comfortably?  10 minutes    How long can you walk comfortably?  household distances    Patient Stated Goals  Pt would like to be able to walk farther, no falls, up and down steps    Currently in Pain?  No/denies         TREATMENT   Ther-ex NuStep L4 x 4 minute for cardiovascular support keeping SPM>50, cues required to keep step per minutes elevated and to avoid distraction; Quantum leg press 120# x 20, 135# x 20 Seated marches 2 x 20 bilateral; Seated clams and adductor squeezes 2 x 20 each; Seated heel raises with manual resistance 2 x 20; Sit to stand from chair without UE support x 10;   Neuromuscular Re-education Airex balance with dynamic reaching utilizing SAEBO to assist with reaching vertically as well as crossing midline; Rockerboard balance A/P and R/L direction x 1 minute each; Rockerboard weight shifting A/P and R/L directions x 1 minute each; Rockerboard ball pass around body to therapist in A/P and R/L directions x multiple bouts each; 6" hurdle step alternating LE x 10 each side;   Pt educated throughout session about proper posture and technique with exercises. Improved exercise technique, movement at target joints, use of target muscles after min to mod verbal, visual, tactile cues.   Pt demonstrates good motivation and demonstrates less fatigue today than during previous sessions.She continues to struggle with reaching outside of her BOS especially when on uneven surfaces like the Airex pad. She is able to increase her resistance on the leg press today and completes without excessive fatigue.She has some swelling over her L olecranon process which is reminiscent of olecranon bursitis. No redness or warmth. Pt denies fevers, chills, or  drainage. Pt encouraged to see her PCP to address. Patient will continue therapyto work on her balance and strength.Pt will benefit from continued skilled PT interventions for improved independence with functional activities.                       PT Short Term Goals - 02/28/18 1359      PT SHORT TERM GOAL #1   Title  Pt will independently complete HEP at least 4 days/wk for improved carryover between sessions    Baseline  reports compliance with HEP, 01/24/18: performing HEP daily    Time  2    Period  Weeks    Status  Achieved  PT SHORT TERM GOAL #2   Title  Pt will inspect her feet at least 1x/day for proper foot care due to peripheral neuropathy    Baseline  increasing frequency; 01/24/18: performs daily; 02/28/18: performs daily    Time  2    Period  Weeks    Status  Achieved        PT Long Term Goals - 03/31/18 1305      PT LONG TERM GOAL #1   Title  Pt's ABC scale will improve to at least 50% to demonstrate decreased perception of imbalance    Baseline  20%; 8/15: 45%; 9/26: 57.5%; 01/24/18: 55%; 02/28/18: 25.63%, 03/31/18=31%    Time  8    Period  Weeks    Status  On-going    Target Date  05/23/18      PT LONG TERM GOAL #2   Title  Pt's TUG time (with SPC) will decrease to less than or equal to 20 seconds to demonstrate improved mobility    Baseline  32 seconds; 8/15: 31 seconds; 9/26: 18.09 sec; 01/24/18: 21.1s; 02/28/18: 18.7s    Time  8    Period  Weeks    Status  Achieved      PT LONG TERM GOAL #3   Title  Pt's Berg Balance Test will improve to at least 42/56 to demonstrate decreased risk of falling    Baseline  23/56; 8/15: 33/56; 9/26: 40/56; 01/24/18: 48/56; 02/28/18: 41/56    Time  8    Period  Weeks    Status  Achieved      PT LONG TERM GOAL #4   Title  Pt's 63mWT (with SPC) will improve to at least 0.6 m/s to demonstrate improved gait speed in the home in community    Baseline  0.26 m/s; 8/15: .42 m/s with AD; 9/26: 0.54  m/s; 01/24/18: self-selected: 35.0s = 0.29 m/s, fastest: 21.7s=0.46 m/s; 02/28/18: self-selected: 32.0s = 0.31 m/s, fastest: 16.8s=0.60 m/s    Time  8    Period  Weeks    Status  Achieved      PT LONG TERM GOAL #5   Title  Pt will demonstrate ability to perform sit<>stand x1 without using UE support for improved QOL    Baseline  Pt requires at least 1UE support; 01/24/18: Able to perform without UE support    Time  4    Period  Weeks    Status  Achieved      PT LONG TERM GOAL #6   Title  Pt will improve BERG by at least 3 points (51/56) in order to demonstrate clinically significant improvement in balance.      Baseline  23/56; 8/15: 33/56; 9/26: 40/56; 01/24/18: 48/56; 02/28/18: 41/56: 03/31/18=41/56    Time  6    Period  Weeks    Status  On-going    Target Date  05/23/18            Plan - 04/07/18 1454    Clinical Impression Statement  Pt demonstrates good motivation and demonstrates less fatigue today than during previous sessions.She continues to struggle with reaching outside of her BOS especially when on uneven surfaces like the Airex pad. She is able to increase her resistance on the leg press today and completes without excessive fatigue.Patient will continue therapyto work on her balance and strength.Pt will benefit from continued skilled PT interventions for improved independence with functional activities.    Rehab Potential  Good    PT Frequency  2x / week    PT Duration  6 weeks    PT Treatment/Interventions  ADLs/Self Care Home Management;Aquatic Therapy;Cryotherapy;Electrical Stimulation;Iontophoresis 4mg /ml Dexamethasone;Moist Heat;Traction;Ultrasound;DME Instruction;Gait training;Stair training;Functional mobility training;Therapeutic activities;Therapeutic exercise;Balance training;Neuromuscular re-education;Patient/family education;Orthotic Fit/Training;Wheelchair mobility training;Manual techniques;Compression bandaging;Passive range of motion;Dry  needling;Energy conservation;Taping    PT Next Visit Plan  Progress HEP as needed and continue with strength and balance training, gait training    PT Home Exercise Plan  seated marching, LAQ    Consulted and Agree with Plan of Care  Patient       Patient will benefit from skilled therapeutic intervention in order to improve the following deficits and impairments:  Abnormal gait, Decreased activity tolerance, Decreased balance, Decreased endurance, Decreased knowledge of use of DME, Decreased mobility, Decreased range of motion, Decreased safety awareness, Decreased strength, Difficulty walking, Hypomobility, Increased fascial restricitons, Impaired perceived functional ability, Impaired flexibility, Increased muscle spasms, Impaired sensation, Improper body mechanics, Postural dysfunction, Pain  Visit Diagnosis: Unsteadiness on feet  Muscle weakness (generalized)     Problem List Patient Active Problem List   Diagnosis Date Noted  . Malnutrition of moderate degree 03/31/2017  . Acute respiratory failure with hypoxia ([place]) 03/30/2017  . AKI (acute kidney injury) ([place]) 07/15/2016  . Protein-calorie malnutrition, severe 02/08/2016  . Primary cancer of right upper lobe of lung ([place]) 11/20/2015  . Abnormal CT lung screening 10/17/2015  . Personal history of tobacco use, presenting hazards to health 10/15/2015  . Chronic vulvitis 09/26/2014  . Allergic reaction 09/26/2014  . Carotid stenosis 08/30/2014  . Cervical nerve root disorder 08/10/2014  . CAD in native artery 08/10/2014  . B12 deficiency 08/10/2014  . Back ache 08/10/2014  . Bronchitis, chronic ([place]) 08/10/2014  . Diabetes mellitus with polyneuropathy ([place]) 08/10/2014  . Can't get food down 08/10/2014  . Eczema of external ear 08/10/2014  . Accumulation of fluid in tissues 08/10/2014  . Gout 08/10/2014  . Adult hypothyroidism 08/10/2014  . Mononeuritis 08/10/2014  . Muscle ache 08/10/2014  . Disorder of peripheral  nervous system 08/10/2014  . Lesion of vulva 08/10/2014  . Cervical spondylosis with radiculopathy 10/19/2013  . COPD exacerbation ([place]) 03/29/2013  . CAD (coronary artery disease) 06/22/2011  . COPD (chronic obstructive pulmonary disease) with emphysema ([place]) 03/07/2010  . CHEST PAIN UNSPECIFIED 07/22/2009  . HLD (hyperlipidemia) 04/01/2009  . Malaise and fatigue 04/01/2009  . Hyperlipidemia 01/18/2009  . TOBACCO ABUSE 01/18/2009  . HYPERTENSION, BENIGN 01/18/2009  . CLAUDICATION 01/18/2009  . Pain in limb 01/18/2009  . CAFL (chronic airflow limitation) ([place]) 01/20/2007  . Late effects of cerebrovascular disease 01/10/2007  . Essential (primary) hypertension 12/22/2006   Phillips Grout PT, DPT, GCS  Tamica Covell 04/07/2018, 4:00 PM  [place] MAIN [facility] SERVICES [address] [place], [place], [address] Phone: [phone]   Fax:  [phone]  Name: [name] [name] MRN: [id number] Date of Birth: 21-Mar-1941

## 2018-04-11 ENCOUNTER — Ambulatory Visit: Payer: Medicare Other

## 2018-04-11 DIAGNOSIS — M6281 Muscle weakness (generalized): Secondary | ICD-10-CM | POA: Diagnosis not present

## 2018-04-11 DIAGNOSIS — R2681 Unsteadiness on feet: Secondary | ICD-10-CM

## 2018-04-11 DIAGNOSIS — R2689 Other abnormalities of gait and mobility: Secondary | ICD-10-CM | POA: Diagnosis not present

## 2018-04-11 LAB — GLUCOSE, CAPILLARY: Glucose-Capillary: 256 mg/dL — ABNORMAL HIGH (ref 70–99)

## 2018-04-11 NOTE — Therapy (Signed)
[place] [facility] [address] [place], [place], [address] Phone: [phone]   Fax:  [phone]  Physical Therapy Treatment  Patient Details  Name: [name] [name] MRN: [id number] Date of Birth: January 28, 1941 Referring Provider (PT): Jerrol Banana, MD   Encounter Date: 04/11/2018  PT End of Session - 04/11/18 1441    Visit Number  [id number]    Number of Visits  85    Date for PT Re-Evaluation  05/23/18    Authorization Type  3/10 progress note,    Authorization Time Period  Last goals: 03/31/18    PT Start Time  1437    PT Stop Time  1515    PT Time Calculation (min)  38 min    Equipment Utilized During Treatment  Gait belt    Activity Tolerance  Patient tolerated treatment well    Behavior During Therapy  [facility] for tasks assessed/performed       Past Medical History:  Diagnosis Date  . Abnormal CT lung screening 10/17/2015  . COPD (chronic obstructive pulmonary disease) ([place])   . Coronary artery disease, non-occlusive    a. cath 2006: min nonobs CAD; b. cath 12/2010: cath LAD 50%, RCA 60%; c. 08/2013: Minimal luminal irregs, right dominant system with no significant CAD, diffuse luminal irregs noted. Normal EF 55%, no AS or MS.   . Diabetes mellitus   . Hyperlipemia    Followed by Dr. Rosanna Randy  . Hypertension   . Lung cancer ([place])   . Macular degeneration    rt  . Personal history of tobacco use, presenting hazards to health 10/15/2015  . Pneumonia    hx  . Shortness of breath   . Stroke [facility])     Past Surgical History:  Procedure Laterality Date  . ABDOMINAL HYSTERECTOMY    . ANTERIOR CERVICAL DECOMP/DISCECTOMY FUSION N/A 10/19/2013   Procedure: CERVICAL FIVE-SIX ANTERIOR CERVICAL DECOMPRESSION WITH FUSION INTERBODY PROSTHESIS PLATING AND PEEK CAGE;  Surgeon: Ophelia Charter, MD;  Location: [place] NEURO ORS;  Service: Neurosurgery;  Laterality: N/A;  . BACK SURGERY  80's  . BREAST CYST EXCISION Left    left negative   .  CARDIAC CATHETERIZATION  05/2004  . CATARACT EXTRACTION Left   . CHOLECYSTECTOMY    . ENDARTERECTOMY Left 10/24/2014   Procedure: ENDARTERECTOMY CAROTID;  Surgeon: Algernon Huxley, MD;  Location: [facility] ORS;  Service: Vascular;  Laterality: Left;  . ENDOBRONCHIAL ULTRASOUND N/A 11/14/2015   Procedure: ENDOBRONCHIAL ULTRASOUND;  Surgeon: [name], MD;  Location: [facility] ORS;  Service: Cardiopulmonary;  Laterality: N/A;  . PERIPHERAL VASCULAR CATHETERIZATION N/A 12/04/2015   Procedure: Glori Luis Cath Insertion;  Surgeon: Algernon Huxley, MD;  Location: [place] CV LAB;  Service: Cardiovascular;  Laterality: N/A;  . PORTA CATH REMOVAL N/A 05/17/2017   Procedure: PORTA CATH REMOVAL;  Surgeon: Algernon Huxley, MD;  Location: [place] CV LAB;  Service: Cardiovascular;  Laterality: N/A;  . TONSILLECTOMY AND ADENOIDECTOMY    . VESICOVAGINAL FISTULA CLOSURE W/ TAH      There were no vitals filed for this visit.  Subjective Assessment - 04/11/18 1440    Subjective  Patient reports no pain, hasn't had a stumble or fall since last session. Compliant with HEP. Pt reports that she saw Dr. Rosanna Randy about the swelling in her L elbow. He told her that he could drain it but it wouldn't be ready for another couple weeks.     Pertinent History  Pt  presents with reports of imbalance and BLE weakness. Pt reports that she had HHPT ~1 year ago and since has become more weak.  Pt reports 6 falls in the past 6 months.  Pt reports she loses her balance due to her RLE giving way, causing her to fall.  Pt with h/o stroke but denies it affecting one side more than the other.  Pt with h/o lung cancer with last treatment ~2 years ago.  Pt reports 8/10 LBP which she attributes to falls in the past.  Pt is able to ascend/descend steps but this is challenging.   Pt has a ramp but she still goes up the steps.  Husband does the driving, grocery shopping.  Pt helps with cooking and cleaning.  Pt independent with showering and dressing.  Pt  ambulates with her SPC at all times.  Pt checks her BP, pulse, O2, blood glucose each day.  She also checks the bottom of her feet 1x/wk due to her peripheral neuropathy (addressed this session to check 1x/day).     How long can you sit comfortably?  no issues    How long can you stand comfortably?  10 minutes    How long can you walk comfortably?  household distances    Patient Stated Goals  Pt would like to be able to walk farther, no falls, up and down steps    Currently in Pain?  No/denies           TREATMENT   Ther-ex Standing hip flexion marches x 15 bilateral; Standing HS curls x 15 bilateral; Standing hip abduction x 15 bilateral; Standing hip extension x 15 bilateral; Standing mini squats x 15 bilateral; Standing heel raises x 15 bilateral; Sit to stand from chair without UE support x 10; Quantum leg press 120# x 20, 135# x 20 Seated clams and adductor squeezes 2 x 20 each; Matrix resisted walking 7.5# forward and L lateral stepping x 2, during second rep of R lateral stepping pt experiences sudden weakness in legs and therapist has to bring over chair to prevent lowering pt to floor. Pt reports that she felt like she suddenly couldn't control her legs. She has not had anything to eat or drink since 7AM this morning, Vitals obtained: BP: 143/41, HR: 62, SaO2: 99%, BG: 256;    Neuromuscular Re-education Rockerboard balance A/P and R/L direction x 1 minute each; Rockerboard balance A/P and R/L direction with horizontal and vertical head turns x 1 minute each; 6" hurdle step alternating LE x 10 each side;   Pt educated throughout session about proper posture and technique with exercises. Improved exercise technique, movement at target joints, use of target muscles after min to mod verbal, visual, tactile cues.   Pt demonstrates good motivation during session today. During matrix resisted walking while laterally stepping to the right pt experiences sudden weakness  in legs and therapist has to bring over chair to prevent lowering pt to floor. Pt reports that she felt like she suddenly couldn't control her legs. She has not had anything to eat or drink since 7AM this morning, Vitals obtained: BP: 143/41, HR: 62, SaO2: 99%, BG: 256. Encouraged pt to follow instructions from PCP regarding managing elevated blood sugar. Pt reports that her BG occasionally spikes and she takes her home meds and lays down. Therapist advised pt that if her BG doesn't come down she should contact her PCP.Pt will benefit from continued skilled PT interventions for improved independence with functional activities.  PT Short Term Goals - 02/28/18 1359      PT SHORT TERM GOAL #1   Title  Pt will independently complete HEP at least 4 days/wk for improved carryover between sessions    Baseline  reports compliance with HEP, 01/24/18: performing HEP daily    Time  2    Period  Weeks    Status  Achieved      PT SHORT TERM GOAL #2   Title  Pt will inspect her feet at least 1x/day for proper foot care due to peripheral neuropathy    Baseline  increasing frequency; 01/24/18: performs daily; 02/28/18: performs daily    Time  2    Period  Weeks    Status  Achieved        PT Long Term Goals - 03/31/18 1305      PT LONG TERM GOAL #1   Title  Pt's ABC scale will improve to at least 50% to demonstrate decreased perception of imbalance    Baseline  20%; 8/15: 45%; 9/26: 57.5%; 01/24/18: 55%; 02/28/18: 25.63%, 03/31/18=31%    Time  8    Period  Weeks    Status  On-going    Target Date  05/23/18      PT LONG TERM GOAL #2   Title  Pt's TUG time (with SPC) will decrease to less than or equal to 20 seconds to demonstrate improved mobility    Baseline  32 seconds; 8/15: 31 seconds; 9/26: 18.09 sec; 01/24/18: 21.1s; 02/28/18: 18.7s    Time  8    Period  Weeks    Status  Achieved      PT LONG TERM GOAL #3   Title  Pt's Berg Balance Test will improve  to at least 42/56 to demonstrate decreased risk of falling    Baseline  23/56; 8/15: 33/56; 9/26: 40/56; 01/24/18: 48/56; 02/28/18: 41/56    Time  8    Period  Weeks    Status  Achieved      PT LONG TERM GOAL #4   Title  Pt's 16mWT (with SPC) will improve to at least 0.6 m/s to demonstrate improved gait speed in the home in community    Baseline  0.26 m/s; 8/15: .42 m/s with AD; 9/26: 0.54 m/s; 01/24/18: self-selected: 35.0s = 0.29 m/s, fastest: 21.7s=0.46 m/s; 02/28/18: self-selected: 32.0s = 0.31 m/s, fastest: 16.8s=0.60 m/s    Time  8    Period  Weeks    Status  Achieved      PT LONG TERM GOAL #5   Title  Pt will demonstrate ability to perform sit<>stand x1 without using UE support for improved QOL    Baseline  Pt requires at least 1UE support; 01/24/18: Able to perform without UE support    Time  4    Period  Weeks    Status  Achieved      PT LONG TERM GOAL #6   Title  Pt will improve BERG by at least 3 points (51/56) in order to demonstrate clinically significant improvement in balance.      Baseline  23/56; 8/15: 33/56; 9/26: 40/56; 01/24/18: 48/56; 02/28/18: 41/56: 03/31/18=41/56    Time  6    Period  Weeks    Status  On-going    Target Date  05/23/18            Plan - 04/11/18 1441    Clinical Impression Statement  Pt demonstrates good motivation during session today. During matrix resisted walking  while laterally stepping to the right pt experiences sudden weakness in legs and therapist has to bring over chair to prevent lowering pt to floor. Pt reports that she felt like she suddenly couldn't control her legs. She has not had anything to eat or drink since 7AM this morning, Vitals obtained: BP: 143/41, HR: 62, SaO2: 99%, BG: 256. Encouraged pt to follow instructions from PCP regarding managing elevated blood sugar. Pt reports that her BG occasionally spikes and she takes her home meds and lays down. Therapist advised pt that if her BG doesn't come down she should contact  her PCP.Pt will benefit from continued skilled PT interventions for improved independence with functional activities.    Rehab Potential  Good    PT Frequency  2x / week    PT Duration  6 weeks    PT Treatment/Interventions  ADLs/Self Care Home Management;Aquatic Therapy;Cryotherapy;Electrical Stimulation;Iontophoresis 4mg /ml Dexamethasone;Moist Heat;Traction;Ultrasound;DME Instruction;Gait training;Stair training;Functional mobility training;Therapeutic activities;Therapeutic exercise;Balance training;Neuromuscular re-education;Patient/family education;Orthotic Fit/Training;Wheelchair mobility training;Manual techniques;Compression bandaging;Passive range of motion;Dry needling;Energy conservation;Taping    PT Next Visit Plan  Progress HEP as needed and continue with strength and balance training, gait training    PT Home Exercise Plan  seated marching, LAQ    Consulted and Agree with Plan of Care  Patient       Patient will benefit from skilled therapeutic intervention in order to improve the following deficits and impairments:  Abnormal gait, Decreased activity tolerance, Decreased balance, Decreased endurance, Decreased knowledge of use of DME, Decreased mobility, Decreased range of motion, Decreased safety awareness, Decreased strength, Difficulty walking, Hypomobility, Increased fascial restricitons, Impaired perceived functional ability, Impaired flexibility, Increased muscle spasms, Impaired sensation, Improper body mechanics, Postural dysfunction, Pain  Visit Diagnosis: Unsteadiness on feet  Muscle weakness (generalized)     Problem List Patient Active Problem List   Diagnosis Date Noted  . Malnutrition of moderate degree 03/31/2017  . Acute respiratory failure with hypoxia ([place]) 03/30/2017  . AKI (acute kidney injury) ([place]) 07/15/2016  . Protein-calorie malnutrition, severe 02/08/2016  . Primary cancer of right upper lobe of lung ([place]) 11/20/2015  . Abnormal CT lung screening  10/17/2015  . Personal history of tobacco use, presenting hazards to health 10/15/2015  . Chronic vulvitis 09/26/2014  . Allergic reaction 09/26/2014  . Carotid stenosis 08/30/2014  . Cervical nerve root disorder 08/10/2014  . CAD in native artery 08/10/2014  . B12 deficiency 08/10/2014  . Back ache 08/10/2014  . Bronchitis, chronic ([place]) 08/10/2014  . Diabetes mellitus with polyneuropathy ([place]) 08/10/2014  . Can't get food down 08/10/2014  . Eczema of external ear 08/10/2014  . Accumulation of fluid in tissues 08/10/2014  . Gout 08/10/2014  . Adult hypothyroidism 08/10/2014  . Mononeuritis 08/10/2014  . Muscle ache 08/10/2014  . Disorder of peripheral nervous system 08/10/2014  . Lesion of vulva 08/10/2014  . Cervical spondylosis with radiculopathy 10/19/2013  . COPD exacerbation ([place]) 03/29/2013  . CAD (coronary artery disease) 06/22/2011  . COPD (chronic obstructive pulmonary disease) with emphysema ([place]) 03/07/2010  . CHEST PAIN UNSPECIFIED 07/22/2009  . HLD (hyperlipidemia) 04/01/2009  . Malaise and fatigue 04/01/2009  . Hyperlipidemia 01/18/2009  . TOBACCO ABUSE 01/18/2009  . HYPERTENSION, BENIGN 01/18/2009  . CLAUDICATION 01/18/2009  . Pain in limb 01/18/2009  . CAFL (chronic airflow limitation) ([place]) 01/20/2007  . Late effects of cerebrovascular disease 01/10/2007  . Essential (primary) hypertension 12/22/2006    Damonie Furney 04/11/2018, 11:11 PM  [place] [facility] [address]  [place], [place], [address] Phone: [phone]   Fax:  [phone]  Name: [name] [name] MRN: [id number] Date of Birth: 06-23-1940

## 2018-04-12 DIAGNOSIS — H353221 Exudative age-related macular degeneration, left eye, with active choroidal neovascularization: Secondary | ICD-10-CM | POA: Diagnosis not present

## 2018-04-13 ENCOUNTER — Ambulatory Visit: Payer: Medicare Other

## 2018-04-18 ENCOUNTER — Ambulatory Visit: Payer: Medicare Other

## 2018-04-18 VITALS — BP 134/66 | HR 60 | Temp 98.3°F

## 2018-04-18 DIAGNOSIS — R2689 Other abnormalities of gait and mobility: Secondary | ICD-10-CM | POA: Diagnosis not present

## 2018-04-18 DIAGNOSIS — R2681 Unsteadiness on feet: Secondary | ICD-10-CM

## 2018-04-18 DIAGNOSIS — M6281 Muscle weakness (generalized): Secondary | ICD-10-CM

## 2018-04-18 NOTE — Therapy (Signed)
[place] MAIN [facility] SERVICES [address] [place], [place], [address] Phone: [phone]   Fax:  [phone]  Physical Therapy Progress Note   Dates of reporting period  03/14/18   to   04/18/18  Patient Details  Name: [name] [name] MRN: [id number] Date of Birth: 1940-09-29 Referring Provider (PT): Jerrol Banana, MD   Encounter Date: 04/18/2018  PT End of Session - 04/18/18 1422    Visit Number  40    Number of Visits  52    Date for PT Re-Evaluation  05/23/18    Authorization Type  10/10 progress note, next visit is 1/10    Authorization Time Period  Last goals: 03/31/18    PT Start Time  1430    PT Stop Time  1515    PT Time Calculation (min)  45 min    Equipment Utilized During Treatment  Gait belt    Activity Tolerance  Patient tolerated treatment well    Behavior During Therapy  WFL for tasks assessed/performed       Past Medical History:  Diagnosis Date  . Abnormal CT lung screening 10/17/2015  . COPD (chronic obstructive pulmonary disease) ([place])   . Coronary artery disease, non-occlusive    a. cath 2006: min nonobs CAD; b. cath 12/2010: cath LAD 50%, RCA 60%; c. 08/2013: Minimal luminal irregs, right dominant system with no significant CAD, diffuse luminal irregs noted. Normal EF 55%, no AS or MS.   . Diabetes mellitus   . Hyperlipemia    Followed by Dr. Rosanna Randy  . Hypertension   . Lung cancer ([place])   . Macular degeneration    rt  . Personal history of tobacco use, presenting hazards to health 10/15/2015  . Pneumonia    hx  . Shortness of breath   . Stroke [facility])     Past Surgical History:  Procedure Laterality Date  . ABDOMINAL HYSTERECTOMY    . ANTERIOR CERVICAL DECOMP/DISCECTOMY FUSION N/A 10/19/2013   Procedure: CERVICAL FIVE-SIX ANTERIOR CERVICAL DECOMPRESSION WITH FUSION INTERBODY PROSTHESIS PLATING AND PEEK CAGE;  Surgeon: Ophelia Charter, MD;  Location: [place] [facility];  Service: Neurosurgery;  Laterality:  N/A;  . BACK SURGERY  80's  . BREAST CYST EXCISION Left    left negative   . CARDIAC CATHETERIZATION  05/2004  . CATARACT EXTRACTION Left   . CHOLECYSTECTOMY    . ENDARTERECTOMY Left 10/24/2014   Procedure: ENDARTERECTOMY CAROTID;  Surgeon: Algernon Huxley, MD;  Location: [facility] ORS;  Service: Vascular;  Laterality: Left;  . ENDOBRONCHIAL ULTRASOUND N/A 11/14/2015   Procedure: ENDOBRONCHIAL ULTRASOUND;  Surgeon: [name], MD;  Location: [facility] ORS;  Service: Cardiopulmonary;  Laterality: N/A;  . PERIPHERAL VASCULAR CATHETERIZATION N/A 12/04/2015   Procedure: Glori Luis Cath Insertion;  Surgeon: Algernon Huxley, MD;  Location: [place] CV LAB;  Service: Cardiovascular;  Laterality: N/A;  . PORTA CATH REMOVAL N/A 05/17/2017   Procedure: PORTA CATH REMOVAL;  Surgeon: Algernon Huxley, MD;  Location: [place] CV LAB;  Service: Cardiovascular;  Laterality: N/A;  . TONSILLECTOMY AND ADENOIDECTOMY    . VESICOVAGINAL FISTULA CLOSURE W/ TAH      Vitals:   04/18/18 1437  BP: 134/66  Pulse: 60  Temp: 98.3 F (36.8 C)  TempSrc: Oral  SpO2: 97%    Subjective Assessment - 04/18/18 1422    Subjective  Pt reports that her low back has been hurting for the last 2 days. No known MOI. She believes  that it "might be my kidneys." Pain increases with transfers and ambulation. No nausea, vomiting, chills, fever, pain with urination, frequent urination, or diarrhea. She reports a mild cold over the last few days with cough. She has a follow-up appointment with Dr. Rosanna Randy on Thursday regarding her elbow.    Pertinent History  Pt presents with reports of imbalance and BLE weakness. Pt reports that she had HHPT ~1 year ago and since has become more weak.  Pt reports 6 falls in the past 6 months.  Pt reports she loses her balance due to her RLE giving way, causing her to fall.  Pt with h/o stroke but denies it affecting one side more than the other.  Pt with h/o lung cancer with last treatment ~2 years ago.  Pt reports 8/10  LBP which she attributes to falls in the past.  Pt is able to ascend/descend steps but this is challenging.   Pt has a ramp but she still goes up the steps.  Husband does the driving, grocery shopping.  Pt helps with cooking and cleaning.  Pt independent with showering and dressing.  Pt ambulates with her SPC at all times.  Pt checks her BP, pulse, O2, blood glucose each day.  She also checks the bottom of her feet 1x/wk due to her peripheral neuropathy (addressed this session to check 1x/day).     How long can you sit comfortably?  no issues    How long can you stand comfortably?  10 minutes    How long can you walk comfortably?  household distances    Patient Stated Goals  Pt would like to be able to walk farther, no falls, up and down steps    Currently in Pain?  Yes    Pain Score  5     Pain Location  Back    Pain Orientation  Right;Lower    Pain Descriptors / Indicators  --   "I just hurts" Pt will not describe further   Pain Type  Acute pain    Pain Onset  In the past 7 days          TREATMENT   Ther-ex Seated hip flexion marches x 10 bilateral; Seated clams and adductor squeezeswith manual resistance x 10 each; Standing hip flexion marches with 2# ankle weights (AW) x 15 bilateral; Standing HS curls with 2# AW x 15 bilateral; Standing hip abduction with 2# AW x 15 bilateral; Standing hip extension with 2# AW x 15 bilateral; Standing mini squats x 10 bilateral; Standing heel raises x 10 bilateral; Sit to stand from chairwith Airex pad on seat without UE support x 10; Quantum leg press 135# x 15, x 10   Neuromuscular Re-education NBOS firm surface balloon passes with step-daughter x 2 minutes; WBOS on Airex pad balloon passes with step-daughter x 2 minutes; Airex pad 4" toe taps alternating LE x 10 each; Airex WBOS ball passes around body with head/eye follow x 10 each direction; 6" hurdle step alternating LE x 10 each side;   Pt educated throughout session  about proper posture and technique with exercises. Improved exercise technique, movement at target joints, use of target muscles after min to mod verbal, visual, tactile cues.   Pt demonstrates good motivation during session today however she demonstrates slightly more fatigue. Her condition has the potential to improve in response to therapy. Maximum improvement is yet to be obtained. The anticipated improvement is attainable and reasonable in a generally predictable time. During the last  goal check pt had progress toward reaching her goals. However her leg strength and balance remain considerably impaired. She will benefit from continued skilled PT to improve balance and improve falls risk.                      PT Short Term Goals - 02/28/18 1359      PT SHORT TERM GOAL #1   Title  Pt will independently complete HEP at least 4 days/wk for improved carryover between sessions    Baseline  reports compliance with HEP, 01/24/18: performing HEP daily    Time  2    Period  Weeks    Status  Achieved      PT SHORT TERM GOAL #2   Title  Pt will inspect her feet at least 1x/day for proper foot care due to peripheral neuropathy    Baseline  increasing frequency; 01/24/18: performs daily; 02/28/18: performs daily    Time  2    Period  Weeks    Status  Achieved        PT Long Term Goals - 03/31/18 1305      PT LONG TERM GOAL #1   Title  Pt's ABC scale will improve to at least 50% to demonstrate decreased perception of imbalance    Baseline  20%; 8/15: 45%; 9/26: 57.5%; 01/24/18: 55%; 02/28/18: 25.63%, 03/31/18=31%    Time  8    Period  Weeks    Status  On-going    Target Date  05/23/18      PT LONG TERM GOAL #2   Title  Pt's TUG time (with SPC) will decrease to less than or equal to 20 seconds to demonstrate improved mobility    Baseline  32 seconds; 8/15: 31 seconds; 9/26: 18.09 sec; 01/24/18: 21.1s; 02/28/18: 18.7s    Time  8    Period  Weeks    Status  Achieved       PT LONG TERM GOAL #3   Title  Pt's Berg Balance Test will improve to at least 42/56 to demonstrate decreased risk of falling    Baseline  23/56; 8/15: 33/56; 9/26: 40/56; 01/24/18: 48/56; 02/28/18: 41/56    Time  8    Period  Weeks    Status  Achieved      PT LONG TERM GOAL #4   Title  Pt's 21mWT (with SPC) will improve to at least 0.6 m/s to demonstrate improved gait speed in the home in community    Baseline  0.26 m/s; 8/15: .42 m/s with AD; 9/26: 0.54 m/s; 01/24/18: self-selected: 35.0s = 0.29 m/s, fastest: 21.7s=0.46 m/s; 02/28/18: self-selected: 32.0s = 0.31 m/s, fastest: 16.8s=0.60 m/s    Time  8    Period  Weeks    Status  Achieved      PT LONG TERM GOAL #5   Title  Pt will demonstrate ability to perform sit<>stand x1 without using UE support for improved QOL    Baseline  Pt requires at least 1UE support; 01/24/18: Able to perform without UE support    Time  4    Period  Weeks    Status  Achieved      PT LONG TERM GOAL #6   Title  Pt will improve BERG by at least 3 points (51/56) in order to demonstrate clinically significant improvement in balance.      Baseline  23/56; 8/15: 33/56; 9/26: 40/56; 01/24/18: 24/26; 02/28/18: 41/56: 03/31/18=41/56    Time  6    Period  Weeks    Status  On-going    Target Date  05/23/18            Plan - 04/18/18 1422    Clinical Impression Statement  Pt demonstrates good motivation during session today however she demonstrates slightly more fatigue. Her condition has the potential to improve in response to therapy. Maximum improvement is yet to be obtained. The anticipated improvement is attainable and reasonable in a generally predictable time. During the last goal check pt had progress toward reaching her goals. However her leg strength and balance remain considerably impaired. She will benefit from continued skilled PT to improve balance and improve falls risk.    Rehab Potential  Good    PT Frequency  2x / week    PT Duration  6  weeks    PT Treatment/Interventions  ADLs/Self Care Home Management;Aquatic Therapy;Cryotherapy;Electrical Stimulation;Iontophoresis 4mg /ml Dexamethasone;Moist Heat;Traction;Ultrasound;DME Instruction;Gait training;Stair training;Functional mobility training;Therapeutic activities;Therapeutic exercise;Balance training;Neuromuscular re-education;Patient/family education;Orthotic Fit/Training;Wheelchair mobility training;Manual techniques;Compression bandaging;Passive range of motion;Dry needling;Energy conservation;Taping    PT Next Visit Plan  Progress HEP as needed and continue with strength and balance training, gait training    PT Home Exercise Plan  seated marching, LAQ    Consulted and Agree with Plan of Care  Patient       Patient will benefit from skilled therapeutic intervention in order to improve the following deficits and impairments:  Abnormal gait, Decreased activity tolerance, Decreased balance, Decreased endurance, Decreased knowledge of use of DME, Decreased mobility, Decreased range of motion, Decreased safety awareness, Decreased strength, Difficulty walking, Hypomobility, Increased fascial restricitons, Impaired perceived functional ability, Impaired flexibility, Increased muscle spasms, Impaired sensation, Improper body mechanics, Postural dysfunction, Pain  Visit Diagnosis: Unsteadiness on feet  Muscle weakness (generalized)  Other abnormalities of gait and mobility     Problem List Patient Active Problem List   Diagnosis Date Noted  . Malnutrition of moderate degree 03/31/2017  . Acute respiratory failure with hypoxia ([place]) 03/30/2017  . AKI (acute kidney injury) ([place]) 07/15/2016  . Protein-calorie malnutrition, severe 02/08/2016  . Primary cancer of right upper lobe of lung ([place]) 11/20/2015  . Abnormal CT lung screening 10/17/2015  . Personal history of tobacco use, presenting hazards to health 10/15/2015  . Chronic vulvitis 09/26/2014  . Allergic reaction  09/26/2014  . Carotid stenosis 08/30/2014  . Cervical nerve root disorder 08/10/2014  . CAD in native artery 08/10/2014  . B12 deficiency 08/10/2014  . Back ache 08/10/2014  . Bronchitis, chronic ([place]) 08/10/2014  . Diabetes mellitus with polyneuropathy ([place]) 08/10/2014  . Can't get food down 08/10/2014  . Eczema of external ear 08/10/2014  . Accumulation of fluid in tissues 08/10/2014  . Gout 08/10/2014  . Adult hypothyroidism 08/10/2014  . Mononeuritis 08/10/2014  . Muscle ache 08/10/2014  . Disorder of peripheral nervous system 08/10/2014  . Lesion of vulva 08/10/2014  . Cervical spondylosis with radiculopathy 10/19/2013  . COPD exacerbation ([place]) 03/29/2013  . CAD (coronary artery disease) 06/22/2011  . COPD (chronic obstructive pulmonary disease) with emphysema ([place]) 03/07/2010  . CHEST PAIN UNSPECIFIED 07/22/2009  . HLD (hyperlipidemia) 04/01/2009  . Malaise and fatigue 04/01/2009  . Hyperlipidemia 01/18/2009  . TOBACCO ABUSE 01/18/2009  . HYPERTENSION, BENIGN 01/18/2009  . CLAUDICATION 01/18/2009  . Pain in limb 01/18/2009  . CAFL (chronic airflow limitation) ([place]) 01/20/2007  . Late effects of cerebrovascular disease 01/10/2007  . Essential (primary) hypertension 12/22/2006   Lyndel Safe Demaryius Imran PT, DPT, GCS  Kieu Quiggle 04/18/2018, 3:26 PM  [place] MAIN [facility] SERVICES [address] [place], [place], [address] Phone: [phone]   Fax:  [phone]  Name: [name] [name] MRN: [id number] Date of Birth: [birth year]

## 2018-04-20 ENCOUNTER — Other Ambulatory Visit: Payer: Self-pay | Admitting: Family Medicine

## 2018-04-20 ENCOUNTER — Ambulatory Visit: Payer: Medicare Other

## 2018-04-20 VITALS — BP 130/42 | HR 61

## 2018-04-20 DIAGNOSIS — R2681 Unsteadiness on feet: Secondary | ICD-10-CM

## 2018-04-20 DIAGNOSIS — R2689 Other abnormalities of gait and mobility: Secondary | ICD-10-CM | POA: Diagnosis not present

## 2018-04-20 DIAGNOSIS — M6281 Muscle weakness (generalized): Secondary | ICD-10-CM

## 2018-04-20 NOTE — Therapy (Signed)
[place] MAIN [facility] SERVICES [address] [place], [place], [address] Phone: [phone]   Fax:  [phone]  Physical Therapy Treatment  Patient Details  Name: [name] [name] MRN: [id number] Date of Birth: 1940/04/22 Referring Provider (PT): Jerrol Banana, MD   Encounter Date: 04/20/2018  PT End of Session - 04/20/18 1515    Visit Number  41    Number of Visits  87    Date for PT Re-Evaluation  05/23/18    Authorization Type  Last goals: 03/31/18    Authorization Time Period  --    PT Start Time  1431    PT Stop Time  1515    PT Time Calculation (min)  44 min    Equipment Utilized During Treatment  Gait belt    Activity Tolerance  Patient tolerated treatment well    Behavior During Therapy  [facility] for tasks assessed/performed       Past Medical History:  Diagnosis Date  . Abnormal CT lung screening 10/17/2015  . COPD (chronic obstructive pulmonary disease) ([place])   . Coronary artery disease, non-occlusive    a. cath 2006: min nonobs CAD; b. cath 12/2010: cath LAD 50%, RCA 60%; c. 08/2013: Minimal luminal irregs, right dominant system with no significant CAD, diffuse luminal irregs noted. Normal EF 55%, no AS or MS.   . Diabetes mellitus   . Hyperlipemia    Followed by Dr. Rosanna Randy  . Hypertension   . Lung cancer ([employer])   . Macular degeneration    rt  . Personal history of tobacco use, presenting hazards to health 10/15/2015  . Pneumonia    hx  . Shortness of breath   . Stroke [name]     Past Surgical History:  Procedure Laterality Date  . ABDOMINAL HYSTERECTOMY    . ANTERIOR CERVICAL DECOMP/DISCECTOMY FUSION N/A 10/19/2013   Procedure: CERVICAL FIVE-SIX ANTERIOR CERVICAL DECOMPRESSION WITH FUSION INTERBODY PROSTHESIS PLATING AND PEEK CAGE;  Surgeon: Ophelia Charter, MD;  Location: [place] NEURO ORS;  Service: Neurosurgery;  Laterality: N/A;  . BACK SURGERY  80's  . BREAST CYST EXCISION Left    left negative   . CARDIAC  CATHETERIZATION  05/2004  . CATARACT EXTRACTION Left   . CHOLECYSTECTOMY    . ENDARTERECTOMY Left 10/24/2014   Procedure: ENDARTERECTOMY CAROTID;  Surgeon: Algernon Huxley, MD;  Location: [facility] ORS;  Service: Vascular;  Laterality: Left;  . ENDOBRONCHIAL ULTRASOUND N/A 11/14/2015   Procedure: ENDOBRONCHIAL ULTRASOUND;  Surgeon: [name], MD;  Location: [facility] ORS;  Service: Cardiopulmonary;  Laterality: N/A;  . PERIPHERAL VASCULAR CATHETERIZATION N/A 12/04/2015   Procedure: Glori Luis Cath Insertion;  Surgeon: Algernon Huxley, MD;  Location: [place] CV LAB;  Service: Cardiovascular;  Laterality: N/A;  . PORTA CATH REMOVAL N/A 05/17/2017   Procedure: PORTA CATH REMOVAL;  Surgeon: Algernon Huxley, MD;  Location: [place] CV LAB;  Service: Cardiovascular;  Laterality: N/A;  . TONSILLECTOMY AND ADENOIDECTOMY    . VESICOVAGINAL FISTULA CLOSURE W/ TAH      Vitals:   04/20/18 1435  BP: (!) 130/42  Pulse: 61  SpO2: 97%    Subjective Assessment - 04/20/18 1440    Subjective  Pt denies any further back pain at this time. No elbow pain either. No specific questions or concerns at this time. Step-daughter states she has been doing better over the last couple days.     Pertinent History  Pt presents with reports of imbalance and BLE  weakness. Pt reports that she had HHPT ~1 year ago and since has become more weak.  Pt reports 6 falls in the past 6 months.  Pt reports she loses her balance due to her RLE giving way, causing her to fall.  Pt with h/o stroke but denies it affecting one side more than the other.  Pt with h/o lung cancer with last treatment ~2 years ago.  Pt reports 8/10 LBP which she attributes to falls in the past.  Pt is able to ascend/descend steps but this is challenging.   Pt has a ramp but she still goes up the steps.  Husband does the driving, grocery shopping.  Pt helps with cooking and cleaning.  Pt independent with showering and dressing.  Pt ambulates with her SPC at all times.  Pt checks  her BP, pulse, O2, blood glucose each day.  She also checks the bottom of her feet 1x/wk due to her peripheral neuropathy (addressed this session to check 1x/day).     How long can you sit comfortably?  no issues    How long can you stand comfortably?  10 minutes    How long can you walk comfortably?  household distances    Patient Stated Goals  Pt would like to be able to walk farther, no falls, up and down steps    Currently in Pain?  No/denies    Pain Onset  --          TREATMENT   Ther-ex Quantum leg press 135# x 20, 150# x 15 Quantum heel raises 90# x 20, 105# x 15 Seated hip flexion marches x 15 bilateral; Seated clams and adductor squeezeswith manual resistance x 15 each; Seated marches x 15 bilateral; Sit to stand from chairfrom regular height chair without UE support x 10;   Neuromuscular Re-education Ambulation in hallway with horizontal ball toss to therapist x 75' each direction; NBOS on firm surface balloon passes with step-daughter x 2 minutes; WBOS on Airex pad balloon passes with step-daughter x 2 minutes; Firm surface WBOS and then NBOS eyes closed x 30s each; Airex pad WBOS eyes closed x 30s;   Pt educated throughout session about proper posture and technique with exercises. Improved exercise technique, movement at target joints, use of target muscles after min to mod verbal, visual, tactile cues.   Pt demonstrates good motivationduring session today with improved stability and no reports of back pain. She is able to complete all exercises as instructed with good progression of leg press resistance. She struggles with head turns during ambulation and with ball tosses she frequently stop before tossing or stumbles requiring assist from therapist to prevent her from falling.Will continue to progress strength and balance training at future visits. She will benefit from continued skilled PT to improve balance and improve falls  risk.                        PT Short Term Goals - 02/28/18 1359      PT SHORT TERM GOAL #1   Title  Pt will independently complete HEP at least 4 days/wk for improved carryover between sessions    Baseline  reports compliance with HEP, 01/24/18: performing HEP daily    Time  2    Period  Weeks    Status  Achieved      PT SHORT TERM GOAL #2   Title  Pt will inspect her feet at least 1x/day for proper foot care due  to peripheral neuropathy    Baseline  increasing frequency; 01/24/18: performs daily; 02/28/18: performs daily    Time  2    Period  Weeks    Status  Achieved        PT Long Term Goals - 03/31/18 1305      PT LONG TERM GOAL #1   Title  Pt's ABC scale will improve to at least 50% to demonstrate decreased perception of imbalance    Baseline  20%; 8/15: 45%; 9/26: 57.5%; 01/24/18: 55%; 02/28/18: 25.63%, 03/31/18=31%    Time  8    Period  Weeks    Status  On-going    Target Date  05/23/18      PT LONG TERM GOAL #2   Title  Pt's TUG time (with SPC) will decrease to less than or equal to 20 seconds to demonstrate improved mobility    Baseline  32 seconds; 8/15: 31 seconds; 9/26: 18.09 sec; 01/24/18: 21.1s; 02/28/18: 18.7s    Time  8    Period  Weeks    Status  Achieved      PT LONG TERM GOAL #3   Title  Pt's Berg Balance Test will improve to at least 42/56 to demonstrate decreased risk of falling    Baseline  23/56; 8/15: 33/56; 9/26: 40/56; 01/24/18: 48/56; 02/28/18: 41/56    Time  8    Period  Weeks    Status  Achieved      PT LONG TERM GOAL #4   Title  Pt's 81mWT (with SPC) will improve to at least 0.6 m/s to demonstrate improved gait speed in the home in community    Baseline  0.26 m/s; 8/15: .42 m/s with AD; 9/26: 0.54 m/s; 01/24/18: self-selected: 35.0s = 0.29 m/s, fastest: 21.7s=0.46 m/s; 02/28/18: self-selected: 32.0s = 0.31 m/s, fastest: 16.8s=0.60 m/s    Time  8    Period  Weeks    Status  Achieved      PT LONG TERM GOAL #5    Title  Pt will demonstrate ability to perform sit<>stand x1 without using UE support for improved QOL    Baseline  Pt requires at least 1UE support; 01/24/18: Able to perform without UE support    Time  4    Period  Weeks    Status  Achieved      PT LONG TERM GOAL #6   Title  Pt will improve BERG by at least 3 points (51/56) in order to demonstrate clinically significant improvement in balance.      Baseline  23/56; 8/15: 33/56; 9/26: 40/56; 01/24/18: 48/56; 02/28/18: 41/56: 03/31/18=41/56    Time  6    Period  Weeks    Status  On-going    Target Date  05/23/18            Plan - 04/21/18 1246    Clinical Impression Statement  Pt demonstrates good motivationduring session today with improved stability and no reports of back pain. She is able to complete all exercises as instructed with good progression of leg press resistance. She struggles with head turns during ambulation and with ball tosses she frequently stop before tossing or stumbles requiring assist from therapist to prevent her from falling.Will continue to progress strength and balance training at future visits. She will benefit from continued skilled PT to improve balance and improve falls risk.    Rehab Potential  Good    PT Frequency  2x / week    PT Duration  6 weeks    PT Treatment/Interventions  ADLs/Self Care Home Management;Aquatic Therapy;Cryotherapy;Electrical Stimulation;Iontophoresis 4mg /ml Dexamethasone;Moist Heat;Traction;Ultrasound;DME Instruction;Gait training;Stair training;Functional mobility training;Therapeutic activities;Therapeutic exercise;Balance training;Neuromuscular re-education;Patient/family education;Orthotic Fit/Training;Wheelchair mobility training;Manual techniques;Compression bandaging;Passive range of motion;Dry needling;Energy conservation;Taping    PT Next Visit Plan  Progress HEP as needed and continue with strength and balance training, gait training    PT Home Exercise Plan  seated  marching, LAQ    Consulted and Agree with Plan of Care  Patient       Patient will benefit from skilled therapeutic intervention in order to improve the following deficits and impairments:  Abnormal gait, Decreased activity tolerance, Decreased balance, Decreased endurance, Decreased knowledge of use of DME, Decreased mobility, Decreased range of motion, Decreased safety awareness, Decreased strength, Difficulty walking, Hypomobility, Increased fascial restricitons, Impaired perceived functional ability, Impaired flexibility, Increased muscle spasms, Impaired sensation, Improper body mechanics, Postural dysfunction, Pain  Visit Diagnosis: Unsteadiness on feet  Muscle weakness (generalized)  Other abnormalities of gait and mobility     Problem List Patient Active Problem List   Diagnosis Date Noted  . Malnutrition of moderate degree 03/31/2017  . Acute respiratory failure with hypoxia ([place]) 03/30/2017  . AKI (acute kidney injury) ([place]) 07/15/2016  . Protein-calorie malnutrition, severe 02/08/2016  . Primary cancer of right upper lobe of lung ([place]) 11/20/2015  . Abnormal CT lung screening 10/17/2015  . Personal history of tobacco use, presenting hazards to health 10/15/2015  . Chronic vulvitis 09/26/2014  . Allergic reaction 09/26/2014  . Carotid stenosis 08/30/2014  . Cervical nerve root disorder 08/10/2014  . CAD in native artery 08/10/2014  . B12 deficiency 08/10/2014  . Back ache 08/10/2014  . Bronchitis, chronic ([place]) 08/10/2014  . Diabetes mellitus with polyneuropathy ([place]) 08/10/2014  . Can't get food down 08/10/2014  . Eczema of external ear 08/10/2014  . Accumulation of fluid in tissues 08/10/2014  . Gout 08/10/2014  . Adult hypothyroidism 08/10/2014  . Mononeuritis 08/10/2014  . Muscle ache 08/10/2014  . Disorder of peripheral nervous system 08/10/2014  . Lesion of vulva 08/10/2014  . Cervical spondylosis with radiculopathy 10/19/2013  . COPD exacerbation ([place])  03/29/2013  . CAD (coronary artery disease) 06/22/2011  . COPD (chronic obstructive pulmonary disease) with emphysema ([place]) 03/07/2010  . CHEST PAIN UNSPECIFIED 07/22/2009  . HLD (hyperlipidemia) 04/01/2009  . Malaise and fatigue 04/01/2009  . Hyperlipidemia 01/18/2009  . TOBACCO ABUSE 01/18/2009  . HYPERTENSION, BENIGN 01/18/2009  . CLAUDICATION 01/18/2009  . Pain in limb 01/18/2009  . CAFL (chronic airflow limitation) ([place]) 01/20/2007  . Late effects of cerebrovascular disease 01/10/2007  . Essential (primary) hypertension 12/22/2006   [facility] Grout PT, DPT, GCS  Kandas Oliveto 04/21/2018, 12:50 PM  [place] MAIN [facility] SERVICES [address] [place], [place], [address] Phone: [phone]   Fax:  [phone]  Name: [name] [name] MRN: [id number] Date of Birth: [birth year]

## 2018-04-21 ENCOUNTER — Ambulatory Visit (INDEPENDENT_AMBULATORY_CARE_PROVIDER_SITE_OTHER): Payer: Medicare Other | Admitting: Family Medicine

## 2018-04-21 VITALS — BP 144/70 | HR 61 | Temp 97.6°F | Resp 16 | Wt 152.0 lb

## 2018-04-21 DIAGNOSIS — M7020 Olecranon bursitis, unspecified elbow: Secondary | ICD-10-CM

## 2018-04-21 DIAGNOSIS — H60543 Acute eczematoid otitis externa, bilateral: Secondary | ICD-10-CM | POA: Diagnosis not present

## 2018-04-21 DIAGNOSIS — I2510 Atherosclerotic heart disease of native coronary artery without angina pectoris: Secondary | ICD-10-CM | POA: Diagnosis not present

## 2018-04-21 DIAGNOSIS — E440 Moderate protein-calorie malnutrition: Secondary | ICD-10-CM | POA: Diagnosis not present

## 2018-04-21 DIAGNOSIS — H6692 Otitis media, unspecified, left ear: Secondary | ICD-10-CM | POA: Diagnosis not present

## 2018-04-21 MED ORDER — DOXYCYCLINE HYCLATE 100 MG PO TABS: 100.0000 mg | ORAL_TABLET | Freq: Two times a day (BID) | ORAL | 0 refills | Status: DC

## 2018-04-21 NOTE — Progress Notes (Signed)
[name] [name]  MRN: [id number] DOB: 25-Aug-1940  Subjective:  HPI   The patient is a 78 year old female who presents for follow up of her left elbow bursitis.  She was last seen on 04/07/2018.  She was advised that she may need x-ray and Ortho referral if no improvement.  She states that the swelling is down and the pain has improved.   Patient Active Problem List   Diagnosis Date Noted  . Malnutrition of moderate degree 03/31/2017  . Acute respiratory failure with hypoxia ([place]) 03/30/2017  . AKI (acute kidney injury) ([place]) 07/15/2016  . Protein-calorie malnutrition, severe 02/08/2016  . Primary cancer of right upper lobe of lung ([place]) 11/20/2015  . Abnormal CT lung screening 10/17/2015  . Personal history of tobacco use, presenting hazards to health 10/15/2015  . Chronic vulvitis 09/26/2014  . Allergic reaction 09/26/2014  . Carotid stenosis 08/30/2014  . Cervical nerve root disorder 08/10/2014  . CAD in native artery 08/10/2014  . B12 deficiency 08/10/2014  . Back ache 08/10/2014  . Bronchitis, chronic ([place]) 08/10/2014  . Diabetes mellitus with polyneuropathy ([place]) 08/10/2014  . Can't get food down 08/10/2014  . Eczema of external ear 08/10/2014  . Accumulation of fluid in tissues 08/10/2014  . Gout 08/10/2014  . Adult hypothyroidism 08/10/2014  . Mononeuritis 08/10/2014  . Muscle ache 08/10/2014  . Disorder of peripheral nervous system 08/10/2014  . Lesion of vulva 08/10/2014  . Cervical spondylosis with radiculopathy 10/19/2013  . COPD exacerbation ([place]) 03/29/2013  . CAD (coronary artery disease) 06/22/2011  . COPD (chronic obstructive pulmonary disease) with emphysema ([place]) 03/07/2010  . CHEST PAIN UNSPECIFIED 07/22/2009  . HLD (hyperlipidemia) 04/01/2009  . Malaise and fatigue 04/01/2009  . Hyperlipidemia 01/18/2009  . TOBACCO ABUSE 01/18/2009  . HYPERTENSION, BENIGN 01/18/2009  . CLAUDICATION 01/18/2009  . Pain in limb 01/18/2009  . CAFL (chronic airflow  limitation) ([place]) 01/20/2007  . Late effects of cerebrovascular disease 01/10/2007  . Essential (primary) hypertension 12/22/2006    Past Medical History:  Diagnosis Date  . Abnormal CT lung screening 10/17/2015  . COPD (chronic obstructive pulmonary disease) ([place])   . Coronary artery disease, non-occlusive    a. cath 2006: min nonobs CAD; b. cath 12/2010: cath LAD 50%, RCA 60%; c. 08/2013: Minimal luminal irregs, right dominant system with no significant CAD, diffuse luminal irregs noted. Normal EF 55%, no AS or MS.   . Diabetes mellitus   . Hyperlipemia    Followed by Dr. Rosanna Randy  . Hypertension   . Lung cancer ([place])   . Macular degeneration    rt  . Personal history of tobacco use, presenting hazards to health 10/15/2015  . Pneumonia    hx  . Shortness of breath   . Stroke [facility])     Social History   Socioeconomic History  . Marital status: Married    Spouse name: Not on file  . Number of children: 4  . Years of education: Not on file  . Highest education level: 11th grade  Occupational History  . Occupation: Retired, Licensed conveyancer: RETIRED  Social Needs  . Financial resource strain: Not on file  . Food insecurity:    Worry: Never true    Inability: Never true  . Transportation needs:    Medical: No    Non-medical: No  Tobacco Use  . Smoking status: Former Smoker    Packs/day: 1.00    Years: 50.00    Pack years: 50.00  Types: Cigarettes    Last attempt to quit: 10/03/2015    Years since quitting: 2.5  . Smokeless tobacco: Never Used  . Tobacco comment: smokes 3 cigs daily 05/06/15. Pt instructed to quit.  Substance and Sexual Activity  . Alcohol use: No    Alcohol/week: 0.0 standard drinks  . Drug use: No  . Sexual activity: Never  Lifestyle  . Physical activity:    Days per week: Not on file    Minutes per session: Not on file  . Stress: Not at all  Relationships  . Social connections:    Talks on phone: Not on file    Gets together:  Not on file    Attends religious service: Not on file    Active member of club or organization: Not on file    Attends meetings of clubs or organizations: Not on file    Relationship status: Not on file  . Intimate partner violence:    Fear of current or ex partner: Not on file    Emotionally abused: Not on file    Physically abused: Not on file    Forced sexual activity: Not on file  Other Topics Concern  . Not on file  Social History Narrative   Married with 4 children   Gets regular exercise    Outpatient Encounter Medications as of 04/21/2018  Medication Sig  . albuterol (PROVENTIL) (5 MG/ML) 0.5% nebulizer solution Take 0.5 mLs (2.5 mg total) by nebulization every 6 (six) hours as needed for wheezing or shortness of breath. (Patient taking differently: Take 2.5 mg by nebulization daily. )  . amoxicillin-clavulanate (AUGMENTIN) 875-125 MG tablet Take 1 tablet by mouth 2 (two) times daily.  . Blood Glucose Monitoring Suppl (ONE TOUCH ULTRA 2) w/Device KIT 1 each by Does not apply route See admin instructions.  . budesonide-formoterol (SYMBICORT) 160-4.5 MCG/ACT inhaler Inhale 2 puffs into the lungs 2 (two) times daily.  Marland Kitchen doxycycline (VIBRA-TABS) 100 MG tablet Take 1 tablet (100 mg total) by mouth 2 (two) times daily.  Marland Kitchen doxycycline (VIBRA-TABS) 100 MG tablet Take 1 tablet (100 mg total) by mouth 2 (two) times daily.  . fluticasone (FLONASE) 50 MCG/ACT nasal spray Place 2 sprays into both nostrils daily.  Marland Kitchen gabapentin (NEURONTIN) 600 MG tablet TAKE ONE-HALF TABLET BY  MOUTH TWO TIMES DAILY  . glimepiride (AMARYL) 2 MG tablet Take 1 tablet (2 mg total) by mouth 2 (two) times daily.  Marland Kitchen HYDROcodone-acetaminophen (NORCO) 5-325 MG tablet Take 1 tablet by mouth every 6 (six) hours as needed for moderate pain or severe pain.  . isosorbide mononitrate (IMDUR) 30 MG 24 hr tablet TAKE 1 TABLET BY MOUTH  DAILY  . LORazepam (ATIVAN) 1 MG tablet Take 1 tablet (1 mg total) by mouth 2 (two) times  daily as needed for anxiety.  . magnesium oxide (MAG-OX) 400 (241.3 MG) MG tablet Take 400 mg by mouth 2 (two) times daily.   . metoprolol tartrate (LOPRESSOR) 25 MG tablet TAKE 1 TABLET BY MOUTH TWO  TIMES DAILY  . mometasone (ELOCON) 0.1 % cream Apply small amount nightly to each ear  . Multiple Vitamins-Minerals (ICAPS) CAPS Take 1 capsule by mouth 2 (two) times daily.   . nitroGLYCERIN (NITROSTAT) 0.4 MG SL tablet Place 1 tablet (0.4 mg total) under the tongue every 5 (five) minutes as needed for chest pain.  Marland Kitchen ofloxacin (FLOXIN) 0.3 % OTIC solution PLACE 10 DROPS INTO THE LEFT EAR DAILY.  Marland Kitchen ondansetron (ZOFRAN) 8 MG tablet Take  1 tablet (8 mg total) by mouth every 8 (eight) hours as needed for nausea or vomiting.  . ONE TOUCH ULTRA TEST test strip USE TO CHECK BLOOD SUGAR ONCE DAILY  . pantoprazole (PROTONIX) 40 MG tablet TAKE 1 TABLET BY MOUTH  DAILY  . triamcinolone cream (KENALOG) 0.1 % Apply 1 application topically 2 (two) times daily.   Facility-Administered Encounter Medications as of 04/21/2018  Medication  . ondansetron (ZOFRAN) 8 mg, dexamethasone (DECADRON) 10 mg in sodium chloride 0.9 % 50 mL IVPB    Allergies  Allergen Reactions  . Coconut Fatty Acids Swelling and Other (See Comments)    Throat swells    Review of Systems  Constitutional: Positive for malaise/fatigue.  Eyes: Negative.   Respiratory: Positive for cough and shortness of breath. Negative for wheezing.   Cardiovascular: Negative for chest pain, palpitations, orthopnea and leg swelling.  Gastrointestinal: Negative.   Musculoskeletal: Positive for back pain.  Skin: Negative.   Endo/Heme/Allergies: Negative.   Psychiatric/Behavioral: Negative.     Objective:  BP (!) 144/70 (BP Location: Right Arm, Patient Position: Sitting, Cuff Size: Normal)   Pulse 61   Temp 97.6 F (36.4 C) (Oral)   Resp 16   Wt 152 lb (68.9 kg)   BMI 26.09 kg/m   Physical Exam  Constitutional: She is oriented to person,  place, and time and well-developed, well-nourished, and in no distress.  HENT:  Head: Normocephalic and atraumatic.  Right Ear: External ear normal.  Nose: Nose normal.  Mouth/Throat: Oropharynx is clear and moist.  Left TM with yellow/green pus in EAC.  Eyes: Pupils are equal, round, and reactive to light. Conjunctivae are normal. No scleral icterus.  Neck: Normal range of motion. No thyromegaly present.  Cardiovascular: Normal rate, regular rhythm and normal heart sounds.  Pulmonary/Chest: Effort normal and breath sounds normal.  Abdominal: Soft.  Musculoskeletal:     Comments: Olecranon bursa almost back to normal.  Neurological: She is alert and oriented to person, place, and time. Gait normal. GCS score is 15.  Skin: Skin is warm and dry.  Psychiatric: Mood, memory, affect and judgment normal.    Assessment and Plan :   1. Left otitis media, unspecified otitis media type Treat but go ahead and  refer to ENT because of chronicity - doxycycline (VIBRA-TABS) 100 MG tablet; Take 1 tablet (100 mg total) by mouth 2 (two) times daily.  Dispense: 20 tablet; Refill: 0 - Ambulatory referral to ENT  2. CAD in native artery All risk factors treated  3. Eczema of both external ears   4. Malnutrition of moderate degree   5. Olecranon bursitis, unspecified laterality  6.Lung Cancer      HPI, Exam and A&P Transcribed under the direction and in the presence of [name]., MD. Electronically Signed: Althea Charon, RMA I have done the exam and reviewed the chart and it is accurate to the best of my knowledge. Development worker, community has been used and  any errors in dictation or transcription are unintentional. Miguel Aschoff M.D. [facility]

## 2018-04-25 ENCOUNTER — Ambulatory Visit: Payer: Medicare Other

## 2018-04-25 DIAGNOSIS — R2689 Other abnormalities of gait and mobility: Secondary | ICD-10-CM

## 2018-04-25 DIAGNOSIS — R0602 Shortness of breath: Secondary | ICD-10-CM | POA: Diagnosis not present

## 2018-04-25 DIAGNOSIS — R2681 Unsteadiness on feet: Secondary | ICD-10-CM | POA: Diagnosis not present

## 2018-04-25 DIAGNOSIS — M6281 Muscle weakness (generalized): Secondary | ICD-10-CM

## 2018-04-25 DIAGNOSIS — J449 Chronic obstructive pulmonary disease, unspecified: Secondary | ICD-10-CM | POA: Diagnosis not present

## 2018-04-25 NOTE — Therapy (Signed)
[place] MAIN [facility] SERVICES [address] [place], [place], [address] Phone: [phone]   Fax:  [phone]  Physical Therapy Treatment  Patient Details  Name: [name] [name] MRN: [id number] Date of Birth: April 11, 1940 Referring Provider (PT): Jerrol Banana, MD   Encounter Date: 04/25/2018  PT End of Session - 04/25/18 1439    Visit Number  42    Number of Visits  30    Date for PT Re-Evaluation  05/23/18    Authorization Type  Last goals: 03/31/18    Authorization Time Period  Last goals: 03/31/18    PT Start Time  1430    PT Stop Time  1510    PT Time Calculation (min)  40 min    Activity Tolerance  Patient tolerated treatment well    Behavior During Therapy  [facility] for tasks assessed/performed       Past Medical History:  Diagnosis Date  . Abnormal CT lung screening 10/17/2015  . COPD (chronic obstructive pulmonary disease) ([place])   . Coronary artery disease, non-occlusive    a. cath 2006: min nonobs CAD; b. cath 12/2010: cath LAD 50%, RCA 60%; c. 08/2013: Minimal luminal irregs, right dominant system with no significant CAD, diffuse luminal irregs noted. Normal EF 55%, no AS or MS.   . Diabetes mellitus   . Hyperlipemia    Followed by Dr. Rosanna Randy  . Hypertension   . Lung cancer ([place])   . Macular degeneration    rt  . Personal history of tobacco use, presenting hazards to health 10/15/2015  . Pneumonia    hx  . Shortness of breath   . Stroke [place])     Past Surgical History:  Procedure Laterality Date  . ABDOMINAL HYSTERECTOMY    . ANTERIOR CERVICAL DECOMP/DISCECTOMY FUSION N/A 10/19/2013   Procedure: CERVICAL FIVE-SIX ANTERIOR CERVICAL DECOMPRESSION WITH FUSION INTERBODY PROSTHESIS PLATING AND PEEK CAGE;  Surgeon: Ophelia Charter, MD;  Location: [place] NEURO ORS;  Service: Neurosurgery;  Laterality: N/A;  . BACK SURGERY  80's  . BREAST CYST EXCISION Left    left negative   . CARDIAC CATHETERIZATION  05/2004  . CATARACT  EXTRACTION Left   . CHOLECYSTECTOMY    . ENDARTERECTOMY Left 10/24/2014   Procedure: ENDARTERECTOMY CAROTID;  Surgeon: Algernon Huxley, MD;  Location: [facility] ORS;  Service: Vascular;  Laterality: Left;  . ENDOBRONCHIAL ULTRASOUND N/A 11/14/2015   Procedure: ENDOBRONCHIAL ULTRASOUND;  Surgeon: [name], MD;  Location: [facility] ORS;  Service: Cardiopulmonary;  Laterality: N/A;  . PERIPHERAL VASCULAR CATHETERIZATION N/A 12/04/2015   Procedure: Glori Luis Cath Insertion;  Surgeon: Algernon Huxley, MD;  Location: [place] CV LAB;  Service: Cardiovascular;  Laterality: N/A;  . PORTA CATH REMOVAL N/A 05/17/2017   Procedure: PORTA CATH REMOVAL;  Surgeon: Algernon Huxley, MD;  Location: [place] CV LAB;  Service: Cardiovascular;  Laterality: N/A;  . TONSILLECTOMY AND ADENOIDECTOMY    . VESICOVAGINAL FISTULA CLOSURE W/ TAH      There were no vitals filed for this visit.  Subjective Assessment - 04/25/18 1437    Subjective  Pt doing ok in general. Continues to have pain associated with recurrent ear infection, now on a new ABX. She will see ENT soon for consult. HEP going well.     Pertinent History  Pt presents with reports of imbalance and BLE weakness. Pt reports that she had HHPT ~1 year ago and since has become more weak.  Pt reports 6 falls  in the past 6 months.  Pt reports she loses her balance due to her RLE giving way, causing her to fall.  Pt with h/o stroke but denies it affecting one side more than the other.  Pt with h/o lung cancer with last treatment ~2 years ago.  Pt reports 8/10 LBP which she attributes to falls in the past.  Pt is able to ascend/descend steps but this is challenging.   Pt has a ramp but she still goes up the steps.  Husband does the driving, grocery shopping.  Pt helps with cooking and cleaning.  Pt independent with showering and dressing.  Pt ambulates with her SPC at all times.  Pt checks her BP, pulse, O2, blood glucose each day.  She also checks the bottom of her feet 1x/wk due to  her peripheral neuropathy (addressed this session to check 1x/day).     Currently in Pain?  Yes    Pain Location  Ear    Pain Orientation  --   difficulty rating ('closed up')       TREATMENT   Ther-ex  -Quantum leg press 135# (2x10, unable to progress to 150# x 15 this date) (seathole #6)  -Quantum heel raises 90# x 20, 105# x 15 (seathole #6) -Seated hip flexion marches x 20 bilateral; -Seated clams x15 each GreenTB -Sit to stand from chair from elevated plinth without UE support x12, everal false starts 2/2 weakness   Neuromuscular Re-education  [Ambulation in hallway with horizontal ball toss to therapist x 75' each direction;] *unable for time -FWD step up and over: 8x c SPC +360* turnaround (1 major LOB with LLE collapse)  -NBOS on firm surface ball passes (45 dgerees rt/Left) with step-daughter 2x10 each direction; -WBOS on Airex pad balloon passes with step-daughter x 2 minutes; -Airex pad WBOS eyes closed 3x30s; -Airex pad WBOS  Fwd ball tosses x15   PT Short Term Goals - 02/28/18 1359      PT SHORT TERM GOAL #1   Title  Pt will independently complete HEP at least 4 days/wk for improved carryover between sessions    Baseline  reports compliance with HEP, 01/24/18: performing HEP daily    Time  2    Period  Weeks    Status  Achieved      PT SHORT TERM GOAL #2   Title  Pt will inspect her feet at least 1x/day for proper foot care due to peripheral neuropathy    Baseline  increasing frequency; 01/24/18: performs daily; 02/28/18: performs daily    Time  2    Period  Weeks    Status  Achieved        PT Long Term Goals - 03/31/18 1305      PT LONG TERM GOAL #1   Title  Pt's ABC scale will improve to at least 50% to demonstrate decreased perception of imbalance    Baseline  20%; 8/15: 45%; 9/26: 57.5%; 01/24/18: 55%; 02/28/18: 25.63%, 03/31/18=31%    Time  8    Period  Weeks    Status  On-going    Target Date  05/23/18      PT LONG TERM GOAL #2   Title  Pt's  TUG time (with SPC) will decrease to less than or equal to 20 seconds to demonstrate improved mobility    Baseline  32 seconds; 8/15: 31 seconds; 9/26: 18.09 sec; 01/24/18: 21.1s; 02/28/18: 18.7s    Time  8    Period  Weeks  Status  Achieved      PT LONG TERM GOAL #3   Title  Pt's Berg Balance Test will improve to at least 42/56 to demonstrate decreased risk of falling    Baseline  23/56; 8/15: 33/56; 9/26: 40/56; 01/24/18: 48/56; 02/28/18: 41/56    Time  8    Period  Weeks    Status  Achieved      PT LONG TERM GOAL #4   Title  Pt's 54mWT (with SPC) will improve to at least 0.6 m/s to demonstrate improved gait speed in the home in community    Baseline  0.26 m/s; 8/15: .42 m/s with AD; 9/26: 0.54 m/s; 01/24/18: self-selected: 35.0s = 0.29 m/s, fastest: 21.7s=0.46 m/s; 02/28/18: self-selected: 32.0s = 0.31 m/s, fastest: 16.8s=0.60 m/s    Time  8    Period  Weeks    Status  Achieved      PT LONG TERM GOAL #5   Title  Pt will demonstrate ability to perform sit<>stand x1 without using UE support for improved QOL    Baseline  Pt requires at least 1UE support; 01/24/18: Able to perform without UE support    Time  4    Period  Weeks    Status  Achieved      PT LONG TERM GOAL #6   Title  Pt will improve BERG by at least 3 points (51/56) in order to demonstrate clinically significant improvement in balance.      Baseline  23/56; 8/15: 33/56; 9/26: 40/56; 01/24/18: 48/56; 02/28/18: 41/56: 03/31/18=41/56    Time  6    Period  Weeks    Status  On-going    Target Date  05/23/18            Plan - 04/25/18 1441    Clinical Impression Statement  Pt doing well in general this date. Pt seems more lethargic and low energy this date, reps taking more time, and additional verbal exclamation of fatigue. Pt able to continue with currnet program, with additional rest. Pt continue to demonstrate improved strength and balance, but has clear deficits that remain.     Rehab Potential  Good    PT  Frequency  2x / week    PT Duration  6 weeks    PT Treatment/Interventions  ADLs/Self Care Home Management;Aquatic Therapy;Cryotherapy;Electrical Stimulation;Iontophoresis 4mg /ml Dexamethasone;Moist Heat;Traction;Ultrasound;DME Instruction;Gait training;Stair training;Functional mobility training;Therapeutic activities;Therapeutic exercise;Balance training;Neuromuscular re-education;Patient/family education;Orthotic Fit/Training;Wheelchair mobility training;Manual techniques;Compression bandaging;Passive range of motion;Dry needling;Energy conservation;Taping    PT Next Visit Plan  Progress HEP as needed and continue with strength and balance training, gait training    PT Home Exercise Plan  seated marching, LAQ    Consulted and Agree with Plan of Care  Patient       Patient will benefit from skilled therapeutic intervention in order to improve the following deficits and impairments:  Abnormal gait, Decreased activity tolerance, Decreased balance, Decreased endurance, Decreased knowledge of use of DME, Decreased mobility, Decreased range of motion, Decreased safety awareness, Decreased strength, Difficulty walking, Hypomobility, Increased fascial restricitons, Impaired perceived functional ability, Impaired flexibility, Increased muscle spasms, Impaired sensation, Improper body mechanics, Postural dysfunction, Pain  Visit Diagnosis: Unsteadiness on feet  Muscle weakness (generalized)  Other abnormalities of gait and mobility     Problem List Patient Active Problem List   Diagnosis Date Noted  . Malnutrition of moderate degree 03/31/2017  . Acute respiratory failure with hypoxia ([place]) 03/30/2017  . AKI (acute kidney injury) ([place]) 07/15/2016  . Protein-calorie malnutrition,  severe 02/08/2016  . Primary cancer of right upper lobe of lung ([place]) 11/20/2015  . Abnormal CT lung screening 10/17/2015  . Personal history of tobacco use, presenting hazards to health 10/15/2015  . Chronic vulvitis  09/26/2014  . Allergic reaction 09/26/2014  . Carotid stenosis 08/30/2014  . Cervical nerve root disorder 08/10/2014  . CAD in native artery 08/10/2014  . B12 deficiency 08/10/2014  . Back ache 08/10/2014  . Bronchitis, chronic ([place]) 08/10/2014  . Diabetes mellitus with polyneuropathy ([place]) 08/10/2014  . Can't get food down 08/10/2014  . Eczema of external ear 08/10/2014  . Accumulation of fluid in tissues 08/10/2014  . Gout 08/10/2014  . Adult hypothyroidism 08/10/2014  . Mononeuritis 08/10/2014  . Muscle ache 08/10/2014  . Disorder of peripheral nervous system 08/10/2014  . Lesion of vulva 08/10/2014  . Cervical spondylosis with radiculopathy 10/19/2013  . COPD exacerbation ([place]) 03/29/2013  . CAD (coronary artery disease) 06/22/2011  . COPD (chronic obstructive pulmonary disease) with emphysema ([place]) 03/07/2010  . CHEST PAIN UNSPECIFIED 07/22/2009  . HLD (hyperlipidemia) 04/01/2009  . Malaise and fatigue 04/01/2009  . Hyperlipidemia 01/18/2009  . TOBACCO ABUSE 01/18/2009  . HYPERTENSION, BENIGN 01/18/2009  . CLAUDICATION 01/18/2009  . Pain in limb 01/18/2009  . CAFL (chronic airflow limitation) ([place]) 01/20/2007  . Late effects of cerebrovascular disease 01/10/2007  . Essential (primary) hypertension 12/22/2006   3:12 PM, 04/25/18 Etta Grandchild, PT, DPT Physical Therapist - [facility]  Outpatient Physical Therapy- [place] [phone]    Etta Grandchild 04/25/2018, 2:50 PM  [place] MAIN [facility] SERVICES [address] [place], [place], [address] Phone: [phone]   Fax:  [phone]  Name: [name] [name] MRN: [id number] Date of Birth: Sep 17, 1940

## 2018-04-27 ENCOUNTER — Ambulatory Visit: Payer: Medicare Other

## 2018-04-27 DIAGNOSIS — J449 Chronic obstructive pulmonary disease, unspecified: Secondary | ICD-10-CM | POA: Diagnosis not present

## 2018-04-27 DIAGNOSIS — R2689 Other abnormalities of gait and mobility: Secondary | ICD-10-CM

## 2018-04-27 DIAGNOSIS — R2681 Unsteadiness on feet: Secondary | ICD-10-CM | POA: Diagnosis not present

## 2018-04-27 DIAGNOSIS — M6281 Muscle weakness (generalized): Secondary | ICD-10-CM

## 2018-04-27 NOTE — Therapy (Signed)
[place] MAIN [facility] SERVICES [address] [place], [place], [address] Phone: [phone]   Fax:  [phone]  Physical Therapy Treatment  Patient Details  Name: [name] [name] MRN: [id number] Date of Birth: 04-Oct-1940 Referring Provider (PT): Jerrol Banana, MD   Encounter Date: 04/27/2018  PT End of Session - 04/27/18 1533    Visit Number  43    Number of Visits  68    Date for PT Re-Evaluation  05/23/18    Authorization Type  Last goals: 03/31/18    Authorization Time Period  Last goals: 03/31/18    PT Start Time  1431    PT Stop Time  1514    PT Time Calculation (min)  43 min    Equipment Utilized During Treatment  Gait belt    Activity Tolerance  Patient tolerated treatment well    Behavior During Therapy  [facility] for tasks assessed/performed       Past Medical History:  Diagnosis Date  . Abnormal CT lung screening 10/17/2015  . COPD (chronic obstructive pulmonary disease) ([place])   . Coronary artery disease, non-occlusive    a. cath 2006: min nonobs CAD; b. cath 12/2010: cath LAD 50%, RCA 60%; c. 08/2013: Minimal luminal irregs, right dominant system with no significant CAD, diffuse luminal irregs noted. Normal EF 55%, no AS or MS.   . Diabetes mellitus   . Hyperlipemia    Followed by Dr. Rosanna Randy  . Hypertension   . Lung cancer ([place])   . Macular degeneration    rt  . Personal history of tobacco use, presenting hazards to health 10/15/2015  . Pneumonia    hx  . Shortness of breath   . Stroke [facility])     Past Surgical History:  Procedure Laterality Date  . ABDOMINAL HYSTERECTOMY    . ANTERIOR CERVICAL DECOMP/DISCECTOMY FUSION N/A 10/19/2013   Procedure: CERVICAL FIVE-SIX ANTERIOR CERVICAL DECOMPRESSION WITH FUSION INTERBODY PROSTHESIS PLATING AND PEEK CAGE;  Surgeon: Ophelia Charter, MD;  Location: [place] NEURO ORS;  Service: Neurosurgery;  Laterality: N/A;  . BACK SURGERY  80's  . BREAST CYST EXCISION Left    left negative    . CARDIAC CATHETERIZATION  05/2004  . CATARACT EXTRACTION Left   . CHOLECYSTECTOMY    . ENDARTERECTOMY Left 10/24/2014   Procedure: ENDARTERECTOMY CAROTID;  Surgeon: Algernon Huxley, MD;  Location: [facility] ORS;  Service: Vascular;  Laterality: Left;  . ENDOBRONCHIAL ULTRASOUND N/A 11/14/2015   Procedure: ENDOBRONCHIAL ULTRASOUND;  Surgeon: [name], MD;  Location: [facility] ORS;  Service: Cardiopulmonary;  Laterality: N/A;  . PERIPHERAL VASCULAR CATHETERIZATION N/A 12/04/2015   Procedure: Glori Luis Cath Insertion;  Surgeon: Algernon Huxley, MD;  Location: [place] CV LAB;  Service: Cardiovascular;  Laterality: N/A;  . PORTA CATH REMOVAL N/A 05/17/2017   Procedure: PORTA CATH REMOVAL;  Surgeon: Algernon Huxley, MD;  Location: [place] CV LAB;  Service: Cardiovascular;  Laterality: N/A;  . TONSILLECTOMY AND ADENOIDECTOMY    . VESICOVAGINAL FISTULA CLOSURE W/ TAH      There were no vitals filed for this visit.  Subjective Assessment - 04/27/18 1531    Subjective  Patient is doing well today, reported that she a bit tired, but no complaints of pain, no recent falls/stumbles.    Pertinent History  Pt presents with reports of imbalance and BLE weakness. Pt reports that she had HHPT ~1 year ago and since has become more weak.  Pt reports 6 falls in  the past 6 months.  Pt reports she loses her balance due to her RLE giving way, causing her to fall.  Pt with h/o stroke but denies it affecting one side more than the other.  Pt with h/o lung cancer with last treatment ~2 years ago.  Pt reports 8/10 LBP which she attributes to falls in the past.  Pt is able to ascend/descend steps but this is challenging.   Pt has a ramp but she still goes up the steps.  Husband does the driving, grocery shopping.  Pt helps with cooking and cleaning.  Pt independent with showering and dressing.  Pt ambulates with her SPC at all times.  Pt checks her BP, pulse, O2, blood glucose each day.  She also checks the bottom of her feet 1x/wk due  to her peripheral neuropathy (addressed this session to check 1x/day).     How long can you sit comfortably?  no issues    How long can you stand comfortably?  10 minutes    How long can you walk comfortably?  household distances    Patient Stated Goals  Pt would like to be able to walk farther, no falls, up and down steps    Currently in Pain?  No/denies       TREATMENT   Ther-ex  -Quantum leg press 110# 2x10, (unable to perform at 135# like last session) -Quantum heel raises 90# x 20 105# x10 -Seated hip flexion marches x 20 bilateral; -Sit to stand from chair from elevated plinth without UE support x12, everal false starts 2/2 weakness (19" table height)    Neuromuscular Re-education  Ambulation in hallway with SPC, cues for step length bilaterally, upright posture, heel strike to decrease shuffling step/schuffing of feet. ~513ft with no rest break -FWD step up and over: 8x c SPC +360* turnaround x8 with 1 loss of significant balance, but overall performed well without UE support -WBOS on uneven surface ball passes (45 dgerees rt/Left) with step-daughter 2x10 each direction; -Airex pad WBOS eyes closed 3x30s; -Airex pad WBOS  Fwd ball tosses x15    PT Education - 04/27/18 1531    Education Details  exercise technique/balance strategies    Person(s) Educated  Patient    Methods  Explanation;Demonstration    Comprehension  Verbalized understanding;Need further instruction       PT Short Term Goals - 02/28/18 1359      PT SHORT TERM GOAL #1   Title  Pt will independently complete HEP at least 4 days/wk for improved carryover between sessions    Baseline  reports compliance with HEP, 01/24/18: performing HEP daily    Time  2    Period  Weeks    Status  Achieved      PT SHORT TERM GOAL #2   Title  Pt will inspect her feet at least 1x/day for proper foot care due to peripheral neuropathy    Baseline  increasing frequency; 01/24/18: performs daily; 02/28/18: performs daily     Time  2    Period  Weeks    Status  Achieved        PT Long Term Goals - 03/31/18 1305      PT LONG TERM GOAL #1   Title  Pt's ABC scale will improve to at least 50% to demonstrate decreased perception of imbalance    Baseline  20%; 8/15: 45%; 9/26: 57.5%; 01/24/18: 55%; 02/28/18: 25.63%, 03/31/18=31%    Time  8    Period  Weeks  Status  On-going    Target Date  05/23/18      PT LONG TERM GOAL #2   Title  Pt's TUG time (with SPC) will decrease to less than or equal to 20 seconds to demonstrate improved mobility    Baseline  32 seconds; 8/15: 31 seconds; 9/26: 18.09 sec; 01/24/18: 21.1s; 02/28/18: 18.7s    Time  8    Period  Weeks    Status  Achieved      PT LONG TERM GOAL #3   Title  Pt's Berg Balance Test will improve to at least 42/56 to demonstrate decreased risk of falling    Baseline  23/56; 8/15: 33/56; 9/26: 40/56; 01/24/18: 48/56; 02/28/18: 41/56    Time  8    Period  Weeks    Status  Achieved      PT LONG TERM GOAL #4   Title  Pt's 35mWT (with SPC) will improve to at least 0.6 m/s to demonstrate improved gait speed in the home in community    Baseline  0.26 m/s; 8/15: .42 m/s with AD; 9/26: 0.54 m/s; 01/24/18: self-selected: 35.0s = 0.29 m/s, fastest: 21.7s=0.46 m/s; 02/28/18: self-selected: 32.0s = 0.31 m/s, fastest: 16.8s=0.60 m/s    Time  8    Period  Weeks    Status  Achieved      PT LONG TERM GOAL #5   Title  Pt will demonstrate ability to perform sit<>stand x1 without using UE support for improved QOL    Baseline  Pt requires at least 1UE support; 01/24/18: Able to perform without UE support    Time  4    Period  Weeks    Status  Achieved      PT LONG TERM GOAL #6   Title  Pt will improve BERG by at least 3 points (51/56) in order to demonstrate clinically significant improvement in balance.      Baseline  23/56; 8/15: 33/56; 9/26: 40/56; 01/24/18: 48/56; 02/28/18: 41/56: 03/31/18=41/56    Time  6    Period  Weeks    Status  On-going    Target  Date  05/23/18            Plan - 04/27/18 1532    Clinical Impression Statement  Patient with improved tolerance to activity today, improved ability to perform sit to stands, close supervision/CGA throughout session. Patient most challenged by ambulation, verbal cues for step length bilaterally, upright posture, heel strike to decrease shuffling step with fair carryover, pt quickly fatigues. The patient would benefit from further skilled PT to continue progress towards goals.     Rehab Potential  Good    PT Frequency  2x / week    PT Duration  6 weeks    PT Treatment/Interventions  ADLs/Self Care Home Management;Aquatic Therapy;Cryotherapy;Electrical Stimulation;Iontophoresis 4mg /ml Dexamethasone;Moist Heat;Traction;Ultrasound;DME Instruction;Gait training;Stair training;Functional mobility training;Therapeutic activities;Therapeutic exercise;Balance training;Neuromuscular re-education;Patient/family education;Orthotic Fit/Training;Wheelchair mobility training;Manual techniques;Compression bandaging;Passive range of motion;Dry needling;Energy conservation;Taping    PT Next Visit Plan  Progress HEP as needed and continue with strength and balance training, gait training    PT Home Exercise Plan  seated marching, LAQ    Consulted and Agree with Plan of Care  Patient       Patient will benefit from skilled therapeutic intervention in order to improve the following deficits and impairments:  Abnormal gait, Decreased activity tolerance, Decreased balance, Decreased endurance, Decreased knowledge of use of DME, Decreased mobility, Decreased range of motion, Decreased safety awareness, Decreased strength, Difficulty walking,  Hypomobility, Increased fascial restricitons, Impaired perceived functional ability, Impaired flexibility, Increased muscle spasms, Impaired sensation, Improper body mechanics, Postural dysfunction, Pain  Visit Diagnosis: Unsteadiness on feet  Muscle weakness  (generalized)  Other abnormalities of gait and mobility     Problem List Patient Active Problem List   Diagnosis Date Noted  . Malnutrition of moderate degree 03/31/2017  . Acute respiratory failure with hypoxia ([place]) 03/30/2017  . AKI (acute kidney injury) ([place]) 07/15/2016  . Protein-calorie malnutrition, severe 02/08/2016  . Primary cancer of right upper lobe of lung ([place]) 11/20/2015  . Abnormal CT lung screening 10/17/2015  . Personal history of tobacco use, presenting hazards to health 10/15/2015  . Chronic vulvitis 09/26/2014  . Allergic reaction 09/26/2014  . Carotid stenosis 08/30/2014  . Cervical nerve root disorder 08/10/2014  . CAD in native artery 08/10/2014  . B12 deficiency 08/10/2014  . Back ache 08/10/2014  . Bronchitis, chronic ([place]) 08/10/2014  . Diabetes mellitus with polyneuropathy ([place]) 08/10/2014  . Can't get food down 08/10/2014  . Eczema of external ear 08/10/2014  . Accumulation of fluid in tissues 08/10/2014  . Gout 08/10/2014  . Adult hypothyroidism 08/10/2014  . Mononeuritis 08/10/2014  . Muscle ache 08/10/2014  . Disorder of peripheral nervous system 08/10/2014  . Lesion of vulva 08/10/2014  . Cervical spondylosis with radiculopathy 10/19/2013  . COPD exacerbation ([place]) 03/29/2013  . CAD (coronary artery disease) 06/22/2011  . COPD (chronic obstructive pulmonary disease) with emphysema ([place]) 03/07/2010  . CHEST PAIN UNSPECIFIED 07/22/2009  . HLD (hyperlipidemia) 04/01/2009  . Malaise and fatigue 04/01/2009  . Hyperlipidemia 01/18/2009  . TOBACCO ABUSE 01/18/2009  . HYPERTENSION, BENIGN 01/18/2009  . CLAUDICATION 01/18/2009  . Pain in limb 01/18/2009  . CAFL (chronic airflow limitation) ([place]) 01/20/2007  . Late effects of cerebrovascular disease 01/10/2007  . Essential (primary) hypertension 12/22/2006    Lieutenant Diego PT, DPT 3:37 PM,04/27/18 [place] MAIN [facility] SERVICES [address] [place], [place], [address] Phone: [phone]   Fax:  [phone]  Name: [name] [name] MRN: [id number] Date of Birth: 11-11-1940

## 2018-05-02 ENCOUNTER — Ambulatory Visit: Payer: Medicare Other

## 2018-05-02 VITALS — BP 136/58 | HR 73

## 2018-05-02 DIAGNOSIS — M6281 Muscle weakness (generalized): Secondary | ICD-10-CM | POA: Diagnosis not present

## 2018-05-02 DIAGNOSIS — R2681 Unsteadiness on feet: Secondary | ICD-10-CM | POA: Diagnosis not present

## 2018-05-02 DIAGNOSIS — R2689 Other abnormalities of gait and mobility: Secondary | ICD-10-CM

## 2018-05-02 NOTE — Therapy (Signed)
[place] MAIN [facility] SERVICES [address] [place], [place], [address] Phone: [phone]   Fax:  [phone]  Physical Therapy Treatment/Goal Update  Patient Details  Name: [name] [name] MRN: [id number] Date of Birth: Nov 14, 1940 Referring Provider (PT): Jerrol Banana, MD   Encounter Date: 05/02/2018  PT End of Session - 05/02/18 1442    Visit Number  44    Number of Visits  77    Date for PT Re-Evaluation  05/23/18    Authorization Type  Last goals: 05/02/18    Authorization Time Period  --    PT Start Time  1433    PT Stop Time  1515    PT Time Calculation (min)  42 min    Equipment Utilized During Treatment  Gait belt    Activity Tolerance  Patient tolerated treatment well    Behavior During Therapy  [facility] for tasks assessed/performed       Past Medical History:  Diagnosis Date  . Abnormal CT lung screening 10/17/2015  . COPD (chronic obstructive pulmonary disease) ([place])   . Coronary artery disease, non-occlusive    a. cath 2006: min nonobs CAD; b. cath 12/2010: cath LAD 50%, RCA 60%; c. 08/2013: Minimal luminal irregs, right dominant system with no significant CAD, diffuse luminal irregs noted. Normal EF 55%, no AS or MS.   . Diabetes mellitus   . Hyperlipemia    Followed by Dr. Rosanna Randy  . Hypertension   . Lung cancer ([place])   . Macular degeneration    rt  . Personal history of tobacco use, presenting hazards to health 10/15/2015  . Pneumonia    hx  . Shortness of breath   . Stroke [facility])     Past Surgical History:  Procedure Laterality Date  . ABDOMINAL HYSTERECTOMY    . ANTERIOR CERVICAL DECOMP/DISCECTOMY FUSION N/A 10/19/2013   Procedure: CERVICAL FIVE-SIX ANTERIOR CERVICAL DECOMPRESSION WITH FUSION INTERBODY PROSTHESIS PLATING AND PEEK CAGE;  Surgeon: Ophelia Charter, MD;  Location: [place] [facility]URO ORS;  Service: Neurosurgery;  Laterality: N/A;  . BACK SURGERY  80's  . BREAST CYST EXCISION Left    left negative   .  CARDIAC CATHETERIZATION  05/2004  . CATARACT EXTRACTION Left   . CHOLECYSTECTOMY    . ENDARTERECTOMY Left 10/24/2014   Procedure: ENDARTERECTOMY CAROTID;  Surgeon: Algernon Huxley, MD;  Location: [facility] ORS;  Service: Vascular;  Laterality: Left;  . ENDOBRONCHIAL ULTRASOUND N/A 11/14/2015   Procedure: ENDOBRONCHIAL ULTRASOUND;  Surgeon: [name], MD;  Location: [facility] ORS;  Service: Cardiopulmonary;  Laterality: N/A;  . PERIPHERAL VASCULAR CATHETERIZATION N/A 12/04/2015   Procedure: Glori Luis Cath Insertion;  Surgeon: Algernon Huxley, MD;  Location: [place] CV LAB;  Service: Cardiovascular;  Laterality: N/A;  . PORTA CATH REMOVAL N/A 05/17/2017   Procedure: PORTA CATH REMOVAL;  Surgeon: Algernon Huxley, MD;  Location: [place] CV LAB;  Service: Cardiovascular;  Laterality: N/A;  . TONSILLECTOMY AND ADENOIDECTOMY    . VESICOVAGINAL FISTULA CLOSURE W/ TAH      Vitals:   05/02/18 1437  BP: (!) 136/58  Pulse: 73  SpO2: 98%    Subjective Assessment - 05/02/18 1442    Subjective  Patient is doing well today. No recent falls/stumbles. No pain and no specific questions or concerns.     Pertinent History  Pt presents with reports of imbalance and BLE weakness. Pt reports that she had HHPT ~1 year ago and since has become more weak.  Pt reports 6 falls in the past 6 months.  Pt reports she loses her balance due to her RLE giving way, causing her to fall.  Pt with h/o stroke but denies it affecting one side more than the other.  Pt with h/o lung cancer with last treatment ~2 years ago.  Pt reports 8/10 LBP which she attributes to falls in the past.  Pt is able to ascend/descend steps but this is challenging.   Pt has a ramp but she still goes up the steps.  Husband does the driving, grocery shopping.  Pt helps with cooking and cleaning.  Pt independent with showering and dressing.  Pt ambulates with her SPC at all times.  Pt checks her BP, pulse, O2, blood glucose each day.  She also checks the bottom of her  feet 1x/wk due to her peripheral neuropathy (addressed this session to check 1x/day).     How long can you sit comfortably?  no issues    How long can you stand comfortably?  10 minutes    How long can you walk comfortably?  household distances    Patient Stated Goals  Pt would like to be able to walk farther, no falls, up and down steps    Currently in Pain?  No/denies         [facility] PT Assessment - 05/02/18 1507      Standardized Balance Assessment   Standardized Balance Assessment  Berg Balance Test;Timed Up and Go Test;10 meter walk test    10 Meter Walk  32m gait speed: self-selected: 26.4s=0.38 m/s, Fastest: 19.4s= 0.52 m/s      Berg Balance Test   Sit to Stand  Able to stand without using hands and stabilize independently    Standing Unsupported  Able to stand safely 2 minutes    Sitting with Back Unsupported but Feet Supported on Floor or Stool  Able to sit safely and securely 2 minutes    Stand to Sit  Controls descent by using hands    Transfers  Able to transfer safely, definite need of hands    Standing Unsupported with Eyes Closed  Able to stand 10 seconds safely    Standing Ubsupported with Feet Together  Able to place feet together independently and stand 1 minute safely    From Standing, Reach Forward with Outstretched Arm  Can reach confidently >25 cm (10")    From Standing Position, Pick up Object from Floor  Able to pick up shoe safely and easily    From Standing Position, Turn to Look Behind Over each Shoulder  Needs supervision when turning    Turn 360 Degrees  Able to turn 360 degrees safely but slowly    Standing Unsupported, Alternately Place Feet on Step/Stool  Able to stand independently and complete 8 steps >20 seconds    Standing Unsupported, One Foot in Front  Able to plae foot ahead of the other independently and hold 30 seconds    Standing on One Leg  Tries to lift leg/unable to hold 3 seconds but remains standing independently    Total Score  44          TREATMENT   Ther-ex Octane warm-up L2-3 x 5 minutes during history; Outcome measures/goals updated with patient including; TUG: 18.1s; ABC (unbilled): 40% BERG: 44/56 48m gait speed: self-selected: 26.4s=0.38 m/s, Fastest: 19.4s= 0.52 m/s Seated hip flexion marches 2 x 10 bilateral; Seated clams and adductor squeezeswith manual resistance2 x 10 each; Seated heel raises x  10 bilateral; Sit to stand from chairfrom regular height chair without UE support x 10;   Pt educated throughout session about proper posture and technique with outcome measures as appropriate as well as exercises. Improved exercise technique, movement at target joints, use of target muscles after min to mod verbal, visual, tactile cues.   Pt demonstrates good motivationduring session todaywith outcome measures. She demonstrates a steady progression of improvement with her outcome measures after they dropped following her fall last year. She is able to perform all exercises as instructed by therapist.  Pt encouraged to continue her HEP.Will continue to progress strength and balance training at future visits. She will benefit from continued skilled PT to improve balance and improve falls risk.                        PT Short Term Goals - 05/02/18 1443      PT SHORT TERM GOAL #1   Title  Pt will independently complete HEP at least 4 days/wk for improved carryover between sessions    Baseline  reports compliance with HEP, 01/24/18: performing HEP daily    Time  2    Period  Weeks    Status  Achieved      PT SHORT TERM GOAL #2   Title  Pt will inspect her feet at least 1x/day for proper foot care due to peripheral neuropathy    Baseline  increasing frequency; 01/24/18: performs daily; 02/28/18: performs daily    Time  2    Period  Weeks    Status  Achieved        PT Long Term Goals - 05/02/18 1447      PT LONG TERM GOAL #1   Title  Pt's ABC scale will improve to at  least 50% to demonstrate decreased perception of imbalance    Baseline  20%; 8/15: 45%; 9/26: 57.5%; 01/24/18: 55%; 02/28/18: 25.63%, 03/31/18=31%; 05/02/18: 40%    Time  8    Period  Weeks    Status  On-going    Target Date  05/23/18      PT LONG TERM GOAL #2   Title  Pt's TUG time (with SPC) will decrease to less than or equal to 20 seconds to demonstrate improved mobility    Baseline  32 seconds; 8/15: 31 seconds; 9/26: 18.09 sec; 01/24/18: 21.1s; 02/28/18: 18.7s; 05/02/18: 18.1s    Time  8    Period  Weeks    Status  Achieved      PT LONG TERM GOAL #3   Title  Pt's Berg Balance Test will improve to at least 42/56 to demonstrate decreased risk of falling    Baseline  23/56; 8/15: 33/56; 9/26: 40/56; 01/24/18: 78/58; 02/28/18: 41/56; 05/02/18: 44/56;    Time  8    Period  Weeks    Status  Achieved      PT LONG TERM GOAL #4   Title  Pt's 61mWT (with SPC) will improve to at least 0.6 m/s to demonstrate improved gait speed in the home in community    Baseline  0.26 m/s; 8/15: .42 m/s with AD; 9/26: 0.54 m/s; 01/24/18: self-selected: 35.0s = 0.29 m/s, fastest: 21.7s=0.46 m/s; 02/28/18: self-selected: 32.0s = 0.31 m/s, fastest: 16.8s=0.60 m/s; self-selected: 26.4s=0.38 m/s, Fastest: 19.4s= 0.52 m/s    Time  8    Period  Weeks    Status  On-going    Target Date  05/23/18  PT LONG TERM GOAL #5   Title  Pt will demonstrate ability to perform sit<>stand x1 without using UE support for improved QOL    Baseline  Pt requires at least 1UE support; 01/24/18: Able to perform without UE support; 05/02/18: Able to perform    Time  4    Period  Weeks    Status  Achieved      PT LONG TERM GOAL #6   Title  Pt will improve BERG by at least 3 points (51/56) in order to demonstrate clinically significant improvement in balance.      Baseline  23/56; 8/15: 33/56; 9/26: 40/56; 01/24/18: 48/56; 02/28/18: 41/56: 03/31/18=41/56; 05/02/18: 44/56    Time  6    Period  Weeks    Status  On-going    Target  Date  05/23/18            Plan - 05/02/18 1443    Clinical Impression Statement  Pt demonstrates good motivationduring session todaywith outcome measures. She demonstrates a steady progression of improvement with her outcome measures after they dropped following her fall last year. She is able to perform all exercises as instructed by therapist.  Pt encouraged to continue her HEP.Will continue to progress strength and balance training at future visits. She will benefit from continued skilled PT to improve balance and improve falls risk.    Rehab Potential  Good    PT Frequency  2x / week    PT Duration  6 weeks    PT Treatment/Interventions  ADLs/Self Care Home Management;Aquatic Therapy;Cryotherapy;Electrical Stimulation;Iontophoresis 4mg /ml Dexamethasone;Moist Heat;Traction;Ultrasound;DME Instruction;Gait training;Stair training;Functional mobility training;Therapeutic activities;Therapeutic exercise;Balance training;Neuromuscular re-education;Patient/family education;Orthotic Fit/Training;Wheelchair mobility training;Manual techniques;Compression bandaging;Passive range of motion;Dry needling;Energy conservation;Taping    PT Next Visit Plan  Progress HEP as needed and continue with strength and balance training, gait training    PT Home Exercise Plan  seated marching, LAQ    Consulted and Agree with Plan of Care  Patient       Patient will benefit from skilled therapeutic intervention in order to improve the following deficits and impairments:  Abnormal gait, Decreased activity tolerance, Decreased balance, Decreased endurance, Decreased knowledge of use of DME, Decreased mobility, Decreased range of motion, Decreased safety awareness, Decreased strength, Difficulty walking, Hypomobility, Increased fascial restricitons, Impaired perceived functional ability, Impaired flexibility, Increased muscle spasms, Impaired sensation, Improper body mechanics, Postural dysfunction, Pain  Visit  Diagnosis: Unsteadiness on feet  Muscle weakness (generalized)  Other abnormalities of gait and mobility     Problem List Patient Active Problem List   Diagnosis Date Noted  . Malnutrition of moderate degree 03/31/2017  . Acute respiratory failure with hypoxia ([place]) 03/30/2017  . AKI (acute kidney injury) ([place]) 07/15/2016  . Protein-calorie malnutrition, severe 02/08/2016  . Primary cancer of right upper lobe of lung ([place]) 11/20/2015  . Abnormal CT lung screening 10/17/2015  . Personal history of tobacco use, presenting hazards to health 10/15/2015  . Chronic vulvitis 09/26/2014  . Allergic reaction 09/26/2014  . Carotid stenosis 08/30/2014  . Cervical nerve root disorder 08/10/2014  . CAD in native artery 08/10/2014  . B12 deficiency 08/10/2014  . Back ache 08/10/2014  . Bronchitis, chronic ([place]) 08/10/2014  . Diabetes mellitus with polyneuropathy ([place]) 08/10/2014  . Can't get food down 08/10/2014  . Eczema of external ear 08/10/2014  . Accumulation of fluid in tissues 08/10/2014  . Gout 08/10/2014  . Adult hypothyroidism 08/10/2014  . Mononeuritis 08/10/2014  . Muscle ache 08/10/2014  . Disorder of peripheral  nervous system 08/10/2014  . Lesion of vulva 08/10/2014  . Cervical spondylosis with radiculopathy 10/19/2013  . COPD exacerbation ([place]) 03/29/2013  . CAD (coronary artery disease) 06/22/2011  . COPD (chronic obstructive pulmonary disease) with emphysema ([place]) 03/07/2010  . CHEST PAIN UNSPECIFIED 07/22/2009  . HLD (hyperlipidemia) 04/01/2009  . Malaise and fatigue 04/01/2009  . Hyperlipidemia 01/18/2009  . TOBACCO ABUSE 01/18/2009  . HYPERTENSION, BENIGN 01/18/2009  . CLAUDICATION 01/18/2009  . Pain in limb 01/18/2009  . CAFL (chronic airflow limitation) ([place]) 01/20/2007  . Late effects of cerebrovascular disease 01/10/2007  . Essential (primary) hypertension 12/22/2006   Phillips Grout PT, DPT, GCS  Amneet Cendejas 05/03/2018, 9:35 PM  [place] MAIN [facility] SERVICES [address] [place], [place], [address] Phone: [phone]   Fax:  [phone]  Name: [name] [name] MRN: [id number] Date of Birth: 1940-08-12

## 2018-05-04 ENCOUNTER — Ambulatory Visit: Payer: Medicare Other

## 2018-05-06 ENCOUNTER — Other Ambulatory Visit: Payer: Self-pay

## 2018-05-06 ENCOUNTER — Inpatient Hospital Stay: Payer: Medicare Other

## 2018-05-06 ENCOUNTER — Emergency Department: Payer: Medicare Other

## 2018-05-06 ENCOUNTER — Ambulatory Visit: Payer: Medicare Other

## 2018-05-06 ENCOUNTER — Encounter: Payer: Self-pay | Admitting: Intensive Care

## 2018-05-06 ENCOUNTER — Inpatient Hospital Stay
Admission: EM | Admit: 2018-05-06 | Discharge: 2018-05-07 | DRG: 065 | Disposition: A | Discharge status: Home / Self Care | Payer: Medicare Other | Source: Other Acute Inpatient Hospital | Attending: Internal Medicine | Admitting: Internal Medicine

## 2018-05-06 VITALS — BP 122/55 | HR 62

## 2018-05-06 DIAGNOSIS — E785 Hyperlipidemia, unspecified: Secondary | ICD-10-CM | POA: Diagnosis not present

## 2018-05-06 DIAGNOSIS — I672 Cerebral atherosclerosis: Secondary | ICD-10-CM | POA: Diagnosis not present

## 2018-05-06 DIAGNOSIS — Z79899 Other long term (current) drug therapy: Secondary | ICD-10-CM | POA: Diagnosis not present

## 2018-05-06 DIAGNOSIS — G9389 Other specified disorders of brain: Secondary | ICD-10-CM | POA: Diagnosis present

## 2018-05-06 DIAGNOSIS — J449 Chronic obstructive pulmonary disease, unspecified: Secondary | ICD-10-CM | POA: Diagnosis not present

## 2018-05-06 DIAGNOSIS — Z981 Arthrodesis status: Secondary | ICD-10-CM

## 2018-05-06 DIAGNOSIS — Z9181 History of falling: Secondary | ICD-10-CM

## 2018-05-06 DIAGNOSIS — R296 Repeated falls: Secondary | ICD-10-CM | POA: Diagnosis not present

## 2018-05-06 DIAGNOSIS — Z801 Family history of malignant neoplasm of trachea, bronchus and lung: Secondary | ICD-10-CM

## 2018-05-06 DIAGNOSIS — Z7951 Long term (current) use of inhaled steroids: Secondary | ICD-10-CM

## 2018-05-06 DIAGNOSIS — G8194 Hemiplegia, unspecified affecting left nondominant side: Secondary | ICD-10-CM | POA: Diagnosis not present

## 2018-05-06 DIAGNOSIS — R29702 NIHSS score 2: Secondary | ICD-10-CM | POA: Diagnosis not present

## 2018-05-06 DIAGNOSIS — Z7984 Long term (current) use of oral hypoglycemic drugs: Secondary | ICD-10-CM

## 2018-05-06 DIAGNOSIS — H3530 Unspecified macular degeneration: Secondary | ICD-10-CM | POA: Diagnosis present

## 2018-05-06 DIAGNOSIS — Z833 Family history of diabetes mellitus: Secondary | ICD-10-CM

## 2018-05-06 DIAGNOSIS — E1165 Type 2 diabetes mellitus with hyperglycemia: Secondary | ICD-10-CM | POA: Diagnosis not present

## 2018-05-06 DIAGNOSIS — R2681 Unsteadiness on feet: Secondary | ICD-10-CM

## 2018-05-06 DIAGNOSIS — E119 Type 2 diabetes mellitus without complications: Secondary | ICD-10-CM | POA: Diagnosis not present

## 2018-05-06 DIAGNOSIS — I639 Cerebral infarction, unspecified: Secondary | ICD-10-CM | POA: Diagnosis not present

## 2018-05-06 DIAGNOSIS — I10 Essential (primary) hypertension: Secondary | ICD-10-CM | POA: Diagnosis not present

## 2018-05-06 DIAGNOSIS — M6281 Muscle weakness (generalized): Secondary | ICD-10-CM

## 2018-05-06 DIAGNOSIS — Z823 Family history of stroke: Secondary | ICD-10-CM

## 2018-05-06 DIAGNOSIS — E1142 Type 2 diabetes mellitus with diabetic polyneuropathy: Secondary | ICD-10-CM | POA: Diagnosis present

## 2018-05-06 DIAGNOSIS — R29810 Facial weakness: Secondary | ICD-10-CM | POA: Diagnosis present

## 2018-05-06 DIAGNOSIS — Z87891 Personal history of nicotine dependence: Secondary | ICD-10-CM

## 2018-05-06 DIAGNOSIS — Z90710 Acquired absence of both cervix and uterus: Secondary | ICD-10-CM

## 2018-05-06 DIAGNOSIS — Z8249 Family history of ischemic heart disease and other diseases of the circulatory system: Secondary | ICD-10-CM | POA: Diagnosis not present

## 2018-05-06 DIAGNOSIS — K219 Gastro-esophageal reflux disease without esophagitis: Secondary | ICD-10-CM | POA: Diagnosis not present

## 2018-05-06 DIAGNOSIS — Z85118 Personal history of other malignant neoplasm of bronchus and lung: Secondary | ICD-10-CM

## 2018-05-06 DIAGNOSIS — I2510 Atherosclerotic heart disease of native coronary artery without angina pectoris: Secondary | ICD-10-CM | POA: Diagnosis present

## 2018-05-06 DIAGNOSIS — I63233 Cerebral infarction due to unspecified occlusion or stenosis of bilateral carotid arteries: Secondary | ICD-10-CM | POA: Diagnosis not present

## 2018-05-06 DIAGNOSIS — N289 Disorder of kidney and ureter, unspecified: Secondary | ICD-10-CM | POA: Diagnosis not present

## 2018-05-06 DIAGNOSIS — I340 Nonrheumatic mitral (valve) insufficiency: Secondary | ICD-10-CM | POA: Diagnosis not present

## 2018-05-06 DIAGNOSIS — I6381 Other cerebral infarction due to occlusion or stenosis of small artery: Principal | ICD-10-CM | POA: Diagnosis present

## 2018-05-06 DIAGNOSIS — C3490 Malignant neoplasm of unspecified part of unspecified bronchus or lung: Secondary | ICD-10-CM | POA: Diagnosis not present

## 2018-05-06 DIAGNOSIS — I361 Nonrheumatic tricuspid (valve) insufficiency: Secondary | ICD-10-CM | POA: Diagnosis not present

## 2018-05-06 LAB — CBC WITH DIFFERENTIAL/PLATELET
Abs Immature Granulocytes: 0.03 10*3/uL (ref 0.00–0.07)
Basophils Absolute: 0 10*3/uL (ref 0.0–0.1)
Basophils Relative: 0 %
EOS PCT: 1 %
Eosinophils Absolute: 0.1 10*3/uL (ref 0.0–0.5)
HCT: 35.3 % — ABNORMAL LOW (ref 36.0–46.0)
Hemoglobin: 11.6 g/dL — ABNORMAL LOW (ref 12.0–15.0)
Immature Granulocytes: 0 %
Lymphocytes Relative: 12 %
Lymphs Abs: 0.9 10*3/uL (ref 0.7–4.0)
MCH: 31.5 pg (ref 26.0–34.0)
MCHC: 32.9 g/dL (ref 30.0–36.0)
MCV: 95.9 fL (ref 80.0–100.0)
Monocytes Absolute: 0.5 10*3/uL (ref 0.1–1.0)
Monocytes Relative: 7 %
Neutro Abs: 5.9 10*3/uL (ref 1.7–7.7)
Neutrophils Relative %: 80 %
Platelets: 203 10*3/uL (ref 150–400)
RBC: 3.68 MIL/uL — ABNORMAL LOW (ref 3.87–5.11)
RDW: 13.5 % (ref 11.5–15.5)
WBC: 7.4 10*3/uL (ref 4.0–10.5)
nRBC: 0 % (ref 0.0–0.2)

## 2018-05-06 LAB — BASIC METABOLIC PANEL
Anion gap: 9 (ref 5–15)
BUN: 30 mg/dL — ABNORMAL HIGH (ref 8–23)
CO2: 27 mmol/L (ref 22–32)
CREATININE: 1.32 mg/dL — AB (ref 0.44–1.00)
Calcium: 9.2 mg/dL (ref 8.9–10.3)
Chloride: 99 mmol/L (ref 98–111)
GFR calc Af Amer: 45 mL/min — ABNORMAL LOW (ref >60)
GFR calc non Af Amer: 39 mL/min — ABNORMAL LOW (ref >60)
Glucose, Bld: 344 mg/dL — ABNORMAL HIGH (ref 70–99)
Potassium: 4.3 mmol/L (ref 3.5–5.1)
Sodium: 135 mmol/L (ref 135–145)

## 2018-05-06 LAB — HEMOGLOBIN A1C
Hgb A1c MFr Bld: 7.6 % — ABNORMAL HIGH (ref 4.8–5.6)
Mean Plasma Glucose: 171.42 mg/dL

## 2018-05-06 LAB — GLUCOSE, CAPILLARY
Glucose-Capillary: 207 mg/dL — ABNORMAL HIGH (ref 70–99)
Glucose-Capillary: 254 mg/dL — ABNORMAL HIGH (ref 70–99)

## 2018-05-06 LAB — TSH: TSH: 2.35 u[IU]/mL (ref 0.350–4.500)

## 2018-05-06 MED ORDER — ATORVASTATIN CALCIUM 20 MG PO TABS
40.0000 mg | ORAL_TABLET | Freq: Every day | ORAL | Status: DC
  Administered 2018-05-06: 40 mg via ORAL
  Filled 2018-05-06: qty 2

## 2018-05-06 MED ORDER — ONDANSETRON HCL 4 MG PO TABS: 4.0000 mg | ORAL_TABLET | Freq: Four times a day (QID) | ORAL | Status: DC | PRN

## 2018-05-06 MED ORDER — MOMETASONE FURO-FORMOTEROL FUM 200-5 MCG/ACT IN AERO
2.0000 | INHALATION_SPRAY | Freq: Two times a day (BID) | RESPIRATORY_TRACT | Status: DC
  Administered 2018-05-06 – 2018-05-07 (×2): 2 via RESPIRATORY_TRACT
  Filled 2018-05-06: qty 8.8

## 2018-05-06 MED ORDER — MAGNESIUM OXIDE 400 (241.3 MG) MG PO TABS
400.0000 mg | ORAL_TABLET | Freq: Two times a day (BID) | ORAL | Status: DC
  Administered 2018-05-06 – 2018-05-07 (×2): 400 mg via ORAL
  Filled 2018-05-06 (×2): qty 1

## 2018-05-06 MED ORDER — STROKE: EARLY STAGES OF RECOVERY BOOK
Freq: Once | Status: AC
  Administered 2018-05-06: 17:00:00

## 2018-05-06 MED ORDER — ASPIRIN 81 MG PO CHEW
324.0000 mg | CHEWABLE_TABLET | Freq: Once | ORAL | Status: AC
  Administered 2018-05-06: 324 mg via ORAL
  Filled 2018-05-06: qty 4

## 2018-05-06 MED ORDER — HYDROCODONE-ACETAMINOPHEN 5-325 MG PO TABS: 1.0000 | ORAL_TABLET | Freq: Four times a day (QID) | ORAL | Status: DC | PRN

## 2018-05-06 MED ORDER — ASPIRIN 325 MG PO TABS
325.0000 mg | ORAL_TABLET | Freq: Every day | ORAL | Status: DC
  Administered 2018-05-07: 09:00:00 325 mg via ORAL
  Filled 2018-05-06 (×2): qty 1

## 2018-05-06 MED ORDER — ENOXAPARIN SODIUM 40 MG/0.4ML ~~LOC~~ SOLN
40.0000 mg | SUBCUTANEOUS | Status: DC
  Administered 2018-05-06: 40 mg via SUBCUTANEOUS
  Filled 2018-05-06: qty 0.4

## 2018-05-06 MED ORDER — GLIMEPIRIDE 2 MG PO TABS
2.0000 mg | ORAL_TABLET | Freq: Two times a day (BID) | ORAL | Status: DC
  Administered 2018-05-06 – 2018-05-07 (×2): 2 mg via ORAL
  Filled 2018-05-06 (×4): qty 1

## 2018-05-06 MED ORDER — LORAZEPAM 1 MG PO TABS: 1.0000 mg | ORAL_TABLET | Freq: Two times a day (BID) | ORAL | Status: DC | PRN

## 2018-05-06 MED ORDER — NITROGLYCERIN 0.4 MG SL SUBL: 0.4000 mg | SUBLINGUAL_TABLET | SUBLINGUAL | Status: DC | PRN

## 2018-05-06 MED ORDER — GABAPENTIN 600 MG PO TABS
300.0000 mg | ORAL_TABLET | Freq: Two times a day (BID) | ORAL | Status: DC
  Administered 2018-05-06 – 2018-05-07 (×2): 300 mg via ORAL
  Filled 2018-05-06 (×2): qty 1

## 2018-05-06 MED ORDER — PANTOPRAZOLE SODIUM 40 MG PO TBEC
40.0000 mg | DELAYED_RELEASE_TABLET | Freq: Every day | ORAL | Status: DC
  Administered 2018-05-06 – 2018-05-07 (×2): 40 mg via ORAL
  Filled 2018-05-06 (×2): qty 1

## 2018-05-06 MED ORDER — INSULIN ASPART 100 UNIT/ML ~~LOC~~ SOLN
0.0000 [IU] | Freq: Three times a day (TID) | SUBCUTANEOUS | Status: DC
  Administered 2018-05-06: 17:00:00 3 [IU] via SUBCUTANEOUS
  Administered 2018-05-07: 09:00:00 2 [IU] via SUBCUTANEOUS
  Administered 2018-05-07: 12:00:00 5 [IU] via SUBCUTANEOUS
  Filled 2018-05-06 (×3): qty 1

## 2018-05-06 MED ORDER — ONDANSETRON HCL 4 MG/2ML IJ SOLN: 4.0000 mg | Freq: Four times a day (QID) | INTRAMUSCULAR | Status: DC | PRN

## 2018-05-06 MED ORDER — ACETAMINOPHEN 325 MG PO TABS: 650.0000 mg | ORAL_TABLET | Freq: Four times a day (QID) | ORAL | Status: DC | PRN

## 2018-05-06 MED ORDER — FLUTICASONE PROPIONATE 50 MCG/ACT NA SUSP
2.0000 | Freq: Every day | NASAL | Status: DC
  Administered 2018-05-07: 09:00:00 2 via NASAL
  Filled 2018-05-06: qty 16

## 2018-05-06 MED ORDER — INSULIN ASPART 100 UNIT/ML ~~LOC~~ SOLN
0.0000 [IU] | Freq: Every day | SUBCUTANEOUS | Status: DC
  Administered 2018-05-06: 22:00:00 3 [IU] via SUBCUTANEOUS
  Filled 2018-05-06: qty 1

## 2018-05-06 MED ORDER — SODIUM CHLORIDE 0.9 % IV SOLN
INTRAVENOUS | Status: DC
  Administered 2018-05-06: 17:00:00 via INTRAVENOUS

## 2018-05-06 MED ORDER — ISOSORBIDE MONONITRATE ER 30 MG PO TB24
30.0000 mg | ORAL_TABLET | Freq: Every day | ORAL | Status: DC
  Administered 2018-05-07: 09:00:00 30 mg via ORAL
  Filled 2018-05-06: qty 1

## 2018-05-06 MED ORDER — ACETAMINOPHEN 650 MG RE SUPP: 650.0000 mg | Freq: Four times a day (QID) | RECTAL | Status: DC | PRN

## 2018-05-06 MED ORDER — METOPROLOL TARTRATE 25 MG PO TABS
25.0000 mg | ORAL_TABLET | Freq: Two times a day (BID) | ORAL | Status: DC
  Administered 2018-05-06 – 2018-05-07 (×2): 25 mg via ORAL
  Filled 2018-05-06 (×2): qty 1

## 2018-05-06 MED ORDER — ASPIRIN 300 MG RE SUPP: 300.0000 mg | Freq: Every day | RECTAL | Status: DC

## 2018-05-06 NOTE — ED Triage Notes (Signed)
Patient presents with Left sided weakness that started on Wednesday. Has been seeing physical therapy since September for a fall. Patient reports falling a lot due to being off balance from legs. Patient ambulates with cane

## 2018-05-06 NOTE — ED Notes (Signed)
Patient transported to CT 

## 2018-05-06 NOTE — ED Notes (Signed)
Pt passed stroke swallow, pt given sandwich tray and water to drink

## 2018-05-06 NOTE — Therapy (Signed)
[place] MAIN [facility] SERVICES [address] [place], [place], [address] Phone: [phone]   Fax:  [phone]  Physical Therapy Treatment  Patient Details  Name: [name] [name] MRN: [id number] Date of Birth: 05/02/1940 Referring Provider (PT): Jerrol Banana, MD   Encounter Date: 05/06/2018    Past Medical History:  Diagnosis Date  . Abnormal CT lung screening 10/17/2015  . COPD (chronic obstructive pulmonary disease) ([place])   . Coronary artery disease, non-occlusive    a. cath 2006: min nonobs CAD; b. cath 12/2010: cath LAD 50%, RCA 60%; c. 08/2013: Minimal luminal irregs, right dominant system with no significant CAD, diffuse luminal irregs noted. Normal EF 55%, no AS or MS.   . Diabetes mellitus   . Hyperlipemia    Followed by Dr. Rosanna Randy  . Hypertension   . Lung cancer ([place])   . Macular degeneration    rt  . Personal history of tobacco use, presenting hazards to health 10/15/2015  . Pneumonia    hx  . Shortness of breath   . Stroke [facility])     Past Surgical History:  Procedure Laterality Date  . ABDOMINAL HYSTERECTOMY    . ANTERIOR CERVICAL DECOMP/DISCECTOMY FUSION N/A 10/19/2013   Procedure: CERVICAL FIVE-SIX ANTERIOR CERVICAL DECOMPRESSION WITH FUSION INTERBODY PROSTHESIS PLATING AND PEEK CAGE;  Surgeon: Ophelia Charter, MD;  Location: [facility] OR[facility];  Service: Neurosurgery;  Laterality: N/A;  . BACK SURGERY  80's  . BREAST CYST EXCISION Left    left negative   . CARDIAC CATHETERIZATION  05/2004  . CATARACT EXTRACTION Left   . CHOLECYSTECTOMY    . ENDARTERECTOMY Left 10/24/2014   Procedure: ENDARTERECTOMY CAROTID;  Surgeon: Algernon Huxley, MD;  Location: [facility] ORS;  Service: Vascular;  Laterality: Left;  . ENDOBRONCHIAL ULTRASOUND N/A 11/14/2015   Procedure: ENDOBRONCHIAL ULTRASOUND;  Surgeon: [name], MD;  Location: [facility] ORS;  Service: Cardiopulmonary;  Laterality: N/A;  . PERIPHERAL VASCULAR CATHETERIZATION N/A 12/04/2015    Procedure: Glori Luis Cath Insertion;  Surgeon: Algernon Huxley, MD;  Location: [place] CV LAB;  Service: Cardiovascular;  Laterality: N/A;  . PORTA CATH REMOVAL N/A 05/17/2017   Procedure: PORTA CATH REMOVAL;  Surgeon: Algernon Huxley, MD;  Location: [place] CV LAB;  Service: Cardiovascular;  Laterality: N/A;  . TONSILLECTOMY AND ADENOIDECTOMY    . VESICOVAGINAL FISTULA CLOSURE W/ TAH      Vitals:   05/06/18 1007  BP: (!) 122/55  Pulse: 62  SpO2: 100%       Pt arrived for physical therapy and before the start of the session pt and daughter report new onset L sided weakness which started 2 days ago. Daughter reports that she first noticed the symptoms on Wednesday morning 05/04/18 when she arrived at her mother's house. Pt complaining of worsening balance and difficulty with transfers. In addition daughter reports that pt has had mildly slurred speech as well as mild confusion/worsening memory. She has had some upper respiratory congestion and a mild cough. Pt denies any painful or frequent urination. Denies nausea/vomiting/chills/fever. Mild L shoulder abduction weakness compared to R side however pt reports chronic L shoulder issues. L grip strength is mildly weaker than R. Otherwise no focal weakness appreciated in bilateral UE/LE. Mildly positive pronator drift on the L side. Ocular ROM is full without observable nystagmus however pt does have very limited sight in her R eye. Pt struggles with transfer from transport chair to plinth with therapist. This is a notable  decline in her mobility from previous therapy sessions. Pt and daughter agree for her to go to the ED due to new onset L sided weakness x 2 days with confusion, slurred speech, and worsening balance. Pt transported to ED and left in the care of ED staff after registration. No therapy was performed on this date. Pt with prior history of CVA approximately 5-6 years ago per her report with no residual deficits.                           PT Short Term Goals - 05/02/18 1443      PT SHORT TERM GOAL #1   Title  Pt will independently complete HEP at least 4 days/wk for improved carryover between sessions    Baseline  reports compliance with HEP, 01/24/18: performing HEP daily    Time  2    Period  Weeks    Status  Achieved      PT SHORT TERM GOAL #2   Title  Pt will inspect her feet at least 1x/day for proper foot care due to peripheral neuropathy    Baseline  increasing frequency; 01/24/18: performs daily; 02/28/18: performs daily    Time  2    Period  Weeks    Status  Achieved        PT Long Term Goals - 05/02/18 1447      PT LONG TERM GOAL #1   Title  Pt's ABC scale will improve to at least 50% to demonstrate decreased perception of imbalance    Baseline  20%; 8/15: 45%; 9/26: 57.5%; 01/24/18: 55%; 02/28/18: 25.63%, 03/31/18=31%; 05/02/18: 40%    Time  8    Period  Weeks    Status  On-going    Target Date  05/23/18      PT LONG TERM GOAL #2   Title  Pt's TUG time (with SPC) will decrease to less than or equal to 20 seconds to demonstrate improved mobility    Baseline  32 seconds; 8/15: 31 seconds; 9/26: 18.09 sec; 01/24/18: 21.1s; 02/28/18: 18.7s; 05/02/18: 18.1s    Time  8    Period  Weeks    Status  Achieved      PT LONG TERM GOAL #3   Title  Pt's Berg Balance Test will improve to at least 42/56 to demonstrate decreased risk of falling    Baseline  23/56; 8/15: 33/56; 9/26: 40/56; 01/24/18: 16/10; 02/28/18: 41/56; 05/02/18: 44/56;    Time  8    Period  Weeks    Status  Achieved      PT LONG TERM GOAL #4   Title  Pt's 20mWT (with SPC) will improve to at least 0.6 m/s to demonstrate improved gait speed in the home in community    Baseline  0.26 m/s; 8/15: .42 m/s with AD; 9/26: 0.54 m/s; 01/24/18: self-selected: 35.0s = 0.29 m/s, fastest: 21.7s=0.46 m/s; 02/28/18: self-selected: 32.0s = 0.31 m/s, fastest: 16.8s=0.60 m/s; self-selected: 26.4s=0.38 m/s, Fastest:  19.4s= 0.52 m/s    Time  8    Period  Weeks    Status  On-going    Target Date  05/23/18      PT LONG TERM GOAL #5   Title  Pt will demonstrate ability to perform sit<>stand x1 without using UE support for improved QOL    Baseline  Pt requires at least 1UE support; 01/24/18: Able to perform without UE support; 05/02/18: Able to perform  Time  4    Period  Weeks    Status  Achieved      PT LONG TERM GOAL #6   Title  Pt will improve BERG by at least 3 points (51/56) in order to demonstrate clinically significant improvement in balance.      Baseline  23/56; 8/15: 33/56; 9/26: 40/56; 01/24/18: 48/56; 02/28/18: 41/56: 03/31/18=41/56; 05/02/18: 44/56    Time  6    Period  Weeks    Status  On-going    Target Date  05/23/18            Plan - 05/06/18 1042    Clinical Impression Statement  Pt arrived for physical therapy and before the start of the session pt and daughter report new onset L sided weakness which started 2 days ago. Daughter reports that she first noticed the symptoms on Wednesday morning 05/04/18 when she arrived at her mother's house. Pt complaining of worsening balance and difficulty with transfers. In addition daughter reports that pt has had mildly slurred speech as well as mild confusion/worsening memory. She has had some upper respiratory congestion and a mild cough. Pt denies any painful or frequent urination. Denies nausea/vomiting/chills/fever. Mild L shoulder abduction weakness compared to R side however pt reports chronic L shoulder issues. L grip strength is mildly weaker than R. Otherwise no focal weakness appreciated in bilateral UE/LE. Mildly positive pronator drift on the L side. Ocular ROM is full without observable nystagmus however pt does have very limited sight in her R eye. Pt struggles with transfer from transport chair to plinth with therapist. This is a notable decline in her mobility from previous therapy sessions. Pt and daughter agree for her to go to  the ED due to new onset L sided weakness x 2 days with confusion, slurred speech, and worsening balance. Pt transported to ED and left in the care of ED staff after registration. No therapy was performed on this date. Pt with prior history of CVA approximately 5-6 years ago per her report with no residual deficits.       Patient will benefit from skilled therapeutic intervention in order to improve the following deficits and impairments:     Visit Diagnosis: Unsteadiness on feet  Muscle weakness (generalized)     Problem List Patient Active Problem List   Diagnosis Date Noted  . Malnutrition of moderate degree 03/31/2017  . Acute respiratory failure with hypoxia ([place]) 03/30/2017  . AKI (acute kidney injury) ([place]) 07/15/2016  . Protein-calorie malnutrition, severe 02/08/2016  . Primary cancer of right upper lobe of lung ([place]) 11/20/2015  . Abnormal CT lung screening 10/17/2015  . Personal history of tobacco use, presenting hazards to health 10/15/2015  . Chronic vulvitis 09/26/2014  . Allergic reaction 09/26/2014  . Carotid stenosis 08/30/2014  . Cervical nerve root disorder 08/10/2014  . CAD in native artery 08/10/2014  . B12 deficiency 08/10/2014  . Back ache 08/10/2014  . Bronchitis, chronic ([place]) 08/10/2014  . Diabetes mellitus with polyneuropathy ([place]) 08/10/2014  . Can't get food down 08/10/2014  . Eczema of external ear 08/10/2014  . Accumulation of fluid in tissues 08/10/2014  . Gout 08/10/2014  . Adult hypothyroidism 08/10/2014  . Mononeuritis 08/10/2014  . Muscle ache 08/10/2014  . Disorder of peripheral nervous system 08/10/2014  . Lesion of vulva 08/10/2014  . Cervical spondylosis with radiculopathy 10/19/2013  . COPD exacerbation ([place]) 03/29/2013  . CAD (coronary artery disease) 06/22/2011  . COPD (chronic obstructive pulmonary disease) with emphysema (  [place]) 03/07/2010  . CHEST PAIN UNSPECIFIED 07/22/2009  . HLD (hyperlipidemia) 04/01/2009  . Malaise and  fatigue 04/01/2009  . Hyperlipidemia 01/18/2009  . TOBACCO ABUSE 01/18/2009  . HYPERTENSION, BENIGN 01/18/2009  . CLAUDICATION 01/18/2009  . Pain in limb 01/18/2009  . CAFL (chronic airflow limitation) ([place]) 01/20/2007  . Late effects of cerebrovascular disease 01/10/2007  . Essential (primary) hypertension 12/22/2006   Phillips Grout PT, DPT, GCS  Huong Luthi 05/06/2018, 10:44 AM  [place] MAIN [facility] SERVICES [address] [place], [place], [address] Phone: [phone]   Fax:  [phone]  Name: MAYBELL MISENHEIMER MRN: [id number] Date of Birth: Apr 01, 1941

## 2018-05-06 NOTE — Plan of Care (Signed)
Patient and family oriented to 1C, pt ambulatory w/walker and assist x1, facial and left arm tingling, nilateral leg drift, not a new finding per patient, pt is also blind in right eye d/t mac degen

## 2018-05-06 NOTE — ED Notes (Signed)
Pt daughter was given phone to speak to MRI due to pt being in CT

## 2018-05-06 NOTE — Consult Note (Addendum)
Referring Physician: Kalisetti, Radhika, MD`    Chief Complaint: Left side weakness and numbness  HPI: [name] [name] is an 78 y.o. female with multiple medical issues most pertinent with history of coronary artery disease, diabetes mellitus, hyperlipidemia, hypertension, carotid artery stenosis status post endarterectomy, and renal impairment presenting to the ED with complaints of left sided weakness and numbness x 3 days.  Patient's husband reports that her gait is not steady at baseline, she is shuffling her gait which is progressively gotten worse in the past 3 days.  Per family member patient was noted on Wednesday of difficulty trying to stand from a sitting position, when she stood up she was noted to have left side weakness and was dragging her left foot.  Patient report a fall in September and has been working with physical therapy since then.  Denies associated altered sensorium, speech abnormality, cranial nerve deficit, seizures, focal motor or sensory deficits, diplopia, nausea or vomiting, syncope or LOC, dizziness or headache.  Patient's family however reports she has had difficulty with processing information and staying on track.  In the ED today she had work-up including CT head which did not show evidence of acute intracranial abnormality.  CT cervical spine was also unremarkable.  However follow-up with MRI of the brain showed acute right thalamic infarct.  Initial NIH stroke scale 2.  Patient is therefore being admitted for further stroke work-up and evaluation.  Date last known well: Unable to determine Time last known well: Unable to determine tPA Given: No: Unable to determine last known well  Past Medical History:  Diagnosis Date  . Abnormal CT lung screening 10/17/2015  . COPD (chronic obstructive pulmonary disease) (HCC)   . Coronary artery disease, non-occlusive    a. cath 2006: min nonobs CAD; b. cath 12/2010: cath LAD 50%, RCA 60%; c. 08/2013: Minimal luminal irregs, right  dominant system with no significant CAD, diffuse luminal irregs noted. Normal EF 55%, no AS or MS.   . Diabetes mellitus   . Hyperlipemia    Followed by Dr. Gilbert  . Hypertension   . Lung cancer (HCC)   . Macular degeneration    rt  . Personal history of tobacco use, presenting hazards to health 10/15/2015  . Pneumonia    hx  . Shortness of breath   . Stroke (HCC)     Past Surgical History:  Procedure Laterality Date  . ABDOMINAL HYSTERECTOMY    . ANTERIOR CERVICAL DECOMP/DISCECTOMY FUSION N/A 10/19/2013   Procedure: CERVICAL FIVE-SIX ANTERIOR CERVICAL DECOMPRESSION WITH FUSION INTERBODY PROSTHESIS PLATING AND PEEK CAGE;  Surgeon: Jeffrey D Jenkins, MD;  Location: [facility];  Service: Neurosurgery;  Laterality: N/A;  . BACK SURGERY  80's  . BREAST CYST EXCISION Left    left negative   . CARDIAC CATHETERIZATION  05/2004  . CATARACT EXTRACTION Left   . CHOLECYSTECTOMY    . ENDARTERECTOMY Left 10/24/2014   Procedure: ENDARTERECTOMY CAROTID;  Surgeon: Jason S Dew, MD;  Location: [facility];  Service: Vascular;  Laterality: Left;  . ENDOBRONCHIAL ULTRASOUND N/A 11/14/2015   Procedure: ENDOBRONCHIAL ULTRASOUND;  Surgeon: Kurian Kasa, MD;  Location: [facility] OR[facility];  Service: Cardiopulmonary;  Laterality: N/A;  . PERIPHERAL VASCULAR CATHETERIZATION N/A 12/04/2015   Procedure: Porta Cath Insertion;  Surgeon: Jason S Dew, MD;  Location: [facility] CV [facility];  Service: Cardiovascular;  Laterality: N/A;  . PORTA CATH REMOVAL N/A 05/17/2017   Procedure: PORTA CATH REMOVAL;  Surgeon: Dew, Jason S, MD;  Location: [facility]   CV LAB;  Service: Cardiovascular;  Laterality: N/A;  . TONSILLECTOMY AND ADENOIDECTOMY    . VESICOVAGINAL FISTULA CLOSURE W/ TAH      Family History  Problem Relation Age of Onset  . Stroke Mother        Massive  . Heart attack Father        Massive  . Heart attack Brother   . Heart attack Brother   . Lung cancer Maternal Grandfather   . Heart attack Paternal Grandmother         MI   Social History:  reports that she quit smoking about 2 years ago. Her smoking use included cigarettes. She has a 50.00 pack-year smoking history. She has never used smokeless tobacco. She reports that she does not drink alcohol or use drugs.  Allergies:  Allergies  Allergen Reactions  . Coconut Fatty Acids Swelling and Other (See Comments)    Throat swells    Medications: I have reviewed the patient's current medications. Prior to Admission medications   Medication Sig Start Date End Date Taking? Authorizing Provider  albuterol (PROVENTIL) (5 MG/ML) 0.5% nebulizer solution Take 0.5 mLs (2.5 mg total) by nebulization every 6 (six) hours as needed for wheezing or shortness of breath. Patient taking differently: Take 2.5 mg by nebulization daily.  04/03/17   Wieting, Richard, MD  amoxicillin-clavulanate (AUGMENTIN) 875-125 MG tablet Take 1 tablet by mouth 2 (two) times daily. 04/07/18   Gilbert, Richard L Jr., MD  Blood Glucose Monitoring Suppl (ONE TOUCH ULTRA 2) w/Device KIT 1 each by Does not apply route See admin instructions. 10/27/17   Gilbert, Richard L Jr., MD  budesonide-formoterol (SYMBICORT) 160-4.5 MCG/ACT inhaler Inhale 2 puffs into the lungs 2 (two) times daily. 09/08/17   Gilbert, Richard L Jr., MD  doxycycline (VIBRA-TABS) 100 MG tablet Take 1 tablet (100 mg total) by mouth 2 (two) times daily. 12/08/17   Gilbert, Richard L Jr., MD  doxycycline (VIBRA-TABS) 100 MG tablet Take 1 tablet (100 mg total) by mouth 2 (two) times daily. 01/13/18   Gilbert, Richard L Jr., MD  doxycycline (VIBRA-TABS) 100 MG tablet Take 1 tablet (100 mg total) by mouth 2 (two) times daily. 04/21/18   Gilbert, Richard L Jr., MD  fluticasone (FLONASE) 50 MCG/ACT nasal spray Place 2 sprays into both nostrils daily. 07/21/17   Bacigalupo, Angela M, MD  gabapentin (NEURONTIN) 600 MG tablet TAKE ONE-HALF TABLET BY  MOUTH TWO TIMES DAILY 09/07/17   Gilbert, Richard L Jr., MD  glimepiride (AMARYL) 2 MG tablet  Take 1 tablet (2 mg total) by mouth 2 (two) times daily. 09/08/17   Gilbert, Richard L Jr., MD  HYDROcodone-acetaminophen (NORCO) 5-325 MG tablet Take 1 tablet by mouth every 6 (six) hours as needed for moderate pain or severe pain. 07/16/16   Kalisetti, Radhika, MD  isosorbide mononitrate (IMDUR) 30 MG 24 hr tablet TAKE 1 TABLET BY MOUTH  DAILY 04/20/18   Gilbert, Richard L Jr., MD  LORazepam (ATIVAN) 1 MG tablet Take 1 tablet (1 mg total) by mouth 2 (two) times daily as needed for anxiety. 05/24/17   Gilbert, Richard L Jr., MD  magnesium oxide (MAG-OX) 400 (241.3 MG) MG tablet Take 400 mg by mouth 2 (two) times daily.  08/04/13   [provider]  metoprolol tartrate (LOPRESSOR) 25 MG tablet TAKE 1 TABLET BY MOUTH TWO  TIMES DAILY 01/11/18   Gilbert, Richard L Jr., MD  mometasone (ELOCON) 0.1 % cream Apply small amount nightly to each ear 12/29/16     Jerrol Banana., MD  Multiple Vitamins-Minerals (ICAPS) CAPS Take 1 capsule by mouth 2 (two) times daily.     [provider]  nitroGLYCERIN (NITROSTAT) 0.4 MG SL tablet Place 1 tablet (0.4 mg total) under the tongue every 5 (five) minutes as needed for chest pain. 04/12/17   Jerrol Banana., MD  ofloxacin (FLOXIN) 0.3 % OTIC solution PLACE 10 DROPS INTO THE LEFT EAR DAILY. 08/03/17   Jerrol Banana., MD  ondansetron (ZOFRAN) 8 MG tablet Take 1 tablet (8 mg total) by mouth every 8 (eight) hours as needed for nausea or vomiting. 07/17/16   Mar Daring, PA-C  ONE TOUCH ULTRA TEST test strip USE TO CHECK BLOOD SUGAR ONCE DAILY 10/27/17   Jerrol Banana., MD  pantoprazole (PROTONIX) 40 MG tablet TAKE 1 TABLET BY MOUTH  DAILY 01/11/18   Jerrol Banana., MD  triamcinolone cream (KENALOG) 0.1 % Apply 1 application topically 2 (two) times daily. 09/21/17   Jerrol Banana., MD     ROS: History obtained from the patient   General ROS: negative for - chills, fatigue, fever, night sweats, weight gain or weight  loss Psychological ROS: negative for - behavioral disorder, hallucinations, memory difficulties, mood swings or suicidal ideation Ophthalmic ROS: negative for - blurry vision, double vision, eye pain or loss of vision ENT ROS: negative for - epistaxis, nasal discharge, oral lesions, sore throat, tinnitus or vertigo Allergy and Immunology ROS: negative for - hives or itchy/watery eyes Hematological and Lymphatic ROS: negative for - bleeding problems, bruising or swollen lymph nodes Endocrine ROS: negative for - galactorrhea, hair pattern changes, polydipsia/polyuria or temperature intolerance Respiratory ROS: negative for - cough, hemoptysis, shortness of breath or wheezing Cardiovascular ROS: negative for - chest pain, dyspnea on exertion, edema or irregular heartbeat Gastrointestinal ROS: negative for - abdominal pain, diarrhea, hematemesis, nausea/vomiting or stool incontinence Genito-Urinary ROS: negative for - dysuria, hematuria, incontinence or urinary frequency/urgency Musculoskeletal ROS: negative for - joint swelling or muscular weakness Neurological ROS: as noted in HPI Dermatological ROS: negative for rash and skin lesion changes  Physical Examination: Blood pressure 127/80, pulse 62, temperature 97.6 F (36.4 C), temperature source Oral, resp. rate 16, height 5' 5" (1.651 m), weight 67.6 kg, SpO2 94 %.   HEENT-  Normocephalic, no lesions, without obvious abnormality.  Normal external eye and conjunctiva.  Normal TM's bilaterally.  Normal auditory canals and external ears. Normal external nose, mucus membranes and septum.  Normal pharynx. Cardiovascular- S1, S2 normal, pulses palpable throughout   Lungs- chest clear, no wheezing, rales, normal symmetric air entry Abdomen- soft, non-tender; bowel sounds normal; no masses,  no organomegaly Extremities- no edema Lymph-no adenopathy palpable Musculoskeletal-no joint tenderness, deformity or swelling Skin-warm and dry, no  hyperpigmentation, vitiligo, or suspicious lesions  Neurological Exam   Mental Status: Alert, oriented, thought content appropriate.  Speech fluent without evidence of aphasia.  Able to follow 3 step commands without difficulty. Attention span and concentration seemed appropriate  Cranial Nerves: II: Discs flat bilaterally; Visual fields grossly normal, pupils equal, round, reactive to light and accommodation III,IV, VI: ptosis not present, extra-ocular motions intact bilaterally V,VII: smile symmetric, facial light touch sensation intact VIII: hearing normal bilaterally IX,X: gag reflex present XI: bilateral shoulder shrug XII: midline tongue extension Motor: Right :  Upper extremity   5/5 Without pronator drift      Left: Upper extremity   5-/5 without pronator drift Right:   Lower extremity  5/5                                          Left: Lower extremity   4+/5 Tone and bulk:normal tone throughout; no atrophy noted Sensory: Pinprick and light touch  decreased on the left Deep Tendon Reflexes: 2+ and symmetric throughout Plantars: Right: mute                              Left: mute Cerebellar: Finger-to-nose testing dysmetric on the left. Heel to shin testing normal bilaterally Gait: not tested due to safety concerns  Data Reviewed  Laboratory Studies:  Basic Metabolic Panel: Recent Labs  Lab 05/06/18 1058  NA 135  K 4.3  CL 99  CO2 27  GLUCOSE 344*  BUN 30*  CREATININE 1.32*  CALCIUM 9.2    Liver Function Tests: No results for input(s): AST, ALT, ALKPHOS, BILITOT, PROT, ALBUMIN in the last 168 hours. No results for input(s): LIPASE, AMYLASE in the last 168 hours. No results for input(s): AMMONIA in the last 168 hours.  CBC: Recent Labs  Lab 05/06/18 1058  WBC 7.4  NEUTROABS 5.9  HGB 11.6*  HCT 35.3*  MCV 95.9  PLT 203    Cardiac Enzymes: No results for input(s): CKTOTAL, CKMB, CKMBINDEX, TROPONINI in the last 168 hours.  BNP: Invalid input(s):  POCBNP  CBG: No results for input(s): GLUCAP in the last 168 hours.  Microbiology: Results for orders placed or performed during the hospital encounter of 03/30/17  Culture, blood (routine x 2)     Status: None   Collection Time: 03/30/17  1:43 PM  Result Value Ref Range Status   Specimen Description BLOOD BLOOD RIGHT FOREARM  Final   Special Requests   Final    BOTTLES DRAWN AEROBIC AND ANAEROBIC Blood Culture adequate volume   Culture   Final    NO GROWTH 5 DAYS Performed at [facility], [address] [place], [place] [address]    Report Status 04/04/2017 FINAL  Final  Culture, blood (routine x 2)     Status: None   Collection Time: 03/30/17  1:43 PM  Result Value Ref Range Status   Specimen Description BLOOD RIGHT ANTECUBITAL  Final   Special Requests   Final    BOTTLES DRAWN AEROBIC AND ANAEROBIC RIGHT ANTECUBITAL   Culture   Final    NO GROWTH 5 DAYS Performed at [facility], [address] [place], [place] [address]    Report Status 04/04/2017 FINAL  Final  Urine culture     Status: Abnormal   Collection Time: 04/01/17  2:00 PM  Result Value Ref Range Status   Specimen Description   Final    URINE, RANDOM Performed at [facility], [address] [place], [place] [address]    Special Requests   Final    NONE Performed at [facility], [address] [place], [place] [address]    Culture MULTIPLE SPECIES PRESENT, SUGGEST RECOLLECTION (A)  Final   Report Status 04/03/2017 FINAL  Final    Coagulation Studies: No results for input(s): LABPROT, INR in the last 72 hours.  Urinalysis: No results for input(s): COLORURINE, LABSPEC, PHURINE, GLUCOSEU, HGBUR, BILIRUBINUR, KETONESUR, PROTEINUR, UROBILINOGEN, NITRITE, LEUKOCYTESUR in the last 168 hours.  Invalid input(s): APPERANCEUR  Lipid Panel:    Component  Value Date/Time   CHOL 210 (H) 05/24/2017 1411   TRIG 352 (H) 05/24/2017 1411   HDL 52 05/24/2017 1411   CHOLHDL  4.1 08/30/2014 0506   VLDL 39 08/30/2014 0506   LDLCALC 88 05/24/2017 1411    HgbA1C:  Lab Results  Component Value Date   HGBA1C 6.9 (A) 01/25/2018    Urine Drug Screen:  No results found for: LABOPIA, COCAINSCRNUR, LABBENZ, AMPHETMU, THCU, LABBARB  Alcohol Level: No results for input(s): ETH in the last 168 hours.  Other results: EKG: normal EKG, normal sinus rhythm, unchanged from previous tracings. Vent. rate 63 BPM PR interval 110 ms QRS duration 74 ms QT/QTc 408/417 ms P-R-T axes 72 15 81  Imaging: Ct Head Wo Contrast  Result Date: 05/06/2018 CLINICAL DATA:  Left-sided weakness for 2 days. Multiple falls. EXAM: CT HEAD WITHOUT CONTRAST CT CERVICAL SPINE WITHOUT CONTRAST TECHNIQUE: Multidetector CT imaging of the head and cervical spine was performed following the standard protocol without intravenous contrast. Multiplanar CT image reconstructions of the cervical spine were also generated. COMPARISON:  Head CT 03/30/2017 and MRI 11/20/2015. Cervical spine MRI 09/11/2013. FINDINGS: CT HEAD FINDINGS Brain: There is no evidence of acute infarct, intracranial hemorrhage, mass, midline shift, or extra-axial fluid collection. Mild ventriculomegaly has minimally progressed from the prior CT and is favored to reflect central predominant cerebral atrophy with communicating hydrocephalus less likely. Patchy cerebral white matter hypodensities may have also slightly progressed and are nonspecific but compatible with mild-to-moderate chronic small vessel ischemic disease. Vascular: Calcified atherosclerosis at the skull base. No hyperdense vessel. Skull: No fracture or suspicious osseous lesion. Sinuses/Orbits: Small volume sphenoid sinus fluid, less than on the prior CT. Clear mastoid air cells. Left cataract extraction. Other: None. CT CERVICAL SPINE FINDINGS Alignment: Chronic cervical spine straightening. Trace fused retrolisthesis of C5 on C6. Skull base and vertebrae: C5-6 ACDF with solid  interbody arthrodesis as well as right facet ankylosis. No acute fracture or suspicious osseous lesion. Soft tissues and spinal canal: No prevertebral fluid or swelling. No visible canal hematoma. Disc levels: Mild cervical spondylosis. No osseous spinal canal or neural foraminal stenosis. Upper chest: Centrilobular emphysema. Other: Asymmetric enlargement of the right thyroid lobe with multiple small nodules posteriorly. Left carotid endarterectomy. Mild right carotid artery calcification with retropharyngeal course of the proximal right ICA. IMPRESSION: 1. No evidence of acute intracranial abnormality. 2. Mild-to-moderate chronic small vessel ischemic disease and cerebral atrophy. 3. No evidence of acute fracture or traumatic subluxation in the cervical spine. Electronically Signed   By: Logan Bores M.D.   On: 05/06/2018 12:28   Ct Cervical Spine Wo Contrast  Result Date: 05/06/2018 CLINICAL DATA:  Left-sided weakness for 2 days. Multiple falls. EXAM: CT HEAD WITHOUT CONTRAST CT CERVICAL SPINE WITHOUT CONTRAST TECHNIQUE: Multidetector CT imaging of the head and cervical spine was performed following the standard protocol without intravenous contrast. Multiplanar CT image reconstructions of the cervical spine were also generated. COMPARISON:  Head CT 03/30/2017 and MRI 11/20/2015. Cervical spine MRI 09/11/2013. FINDINGS: CT HEAD FINDINGS Brain: There is no evidence of acute infarct, intracranial hemorrhage, mass, midline shift, or extra-axial fluid collection. Mild ventriculomegaly has minimally progressed from the prior CT and is favored to reflect central predominant cerebral atrophy with communicating hydrocephalus less likely. Patchy cerebral white matter hypodensities may have also slightly progressed and are nonspecific but compatible with mild-to-moderate chronic small vessel ischemic disease. Vascular: Calcified atherosclerosis at the skull base. No hyperdense vessel. Skull: No fracture or suspicious  osseous lesion. Sinuses/Orbits:  Small volume sphenoid sinus fluid, less than on the prior CT. Clear mastoid air cells. Left cataract extraction. Other: None. CT CERVICAL SPINE FINDINGS Alignment: Chronic cervical spine straightening. Trace fused retrolisthesis of C5 on C6. Skull base and vertebrae: C5-6 ACDF with solid interbody arthrodesis as well as right facet ankylosis. No acute fracture or suspicious osseous lesion. Soft tissues and spinal canal: No prevertebral fluid or swelling. No visible canal hematoma. Disc levels: Mild cervical spondylosis. No osseous spinal canal or neural foraminal stenosis. Upper chest: Centrilobular emphysema. Other: Asymmetric enlargement of the right thyroid lobe with multiple small nodules posteriorly. Left carotid endarterectomy. Mild right carotid artery calcification with retropharyngeal course of the proximal right ICA. IMPRESSION: 1. No evidence of acute intracranial abnormality. 2. Mild-to-moderate chronic small vessel ischemic disease and cerebral atrophy. 3. No evidence of acute fracture or traumatic subluxation in the cervical spine. Electronically Signed   By: Logan Bores M.D.   On: 05/06/2018 12:28   Mr Brain Wo Contrast  Result Date: 05/06/2018 CLINICAL DATA:  LEFT-sided weakness began 2 days ago. EXAM: MRI HEAD WITHOUT CONTRAST TECHNIQUE: Multiplanar, multiecho pulse sequences of the brain and surrounding structures were obtained without intravenous contrast. COMPARISON:  CT head 05/06/2018.  MR head 11/20/2015. FINDINGS: Brain: 5 mm focus of restricted diffusion affects the RIGHT medial dorsal thalamus, corresponding low ADC, consistent with acute infarction. No other areas of acute stroke are identified. No hemorrhage, mass lesion,  or extra-axial fluid. Premature for age cerebral and cerebellar atrophy. T2 and FLAIR hyperintensities throughout the periventricular and subcortical white matter consistent with small vessel disease. Facilitated diffusion involves a  LEFT parietal periventricular area of gliosis. Ventriculomegaly out of proportion to the degree of cortical atrophy, with continuous periventricular T2 high signal, raises question normal pressure hydrocephalus. Vascular: Flow voids are maintained. Tiny focus of chronic hemorrhage, LEFT posterior temporal subcortical white matter. Skull and upper cervical spine: Unremarkable visualized calvarium, skullbase, and cervical vertebrae. Pituitary, pineal, cerebellar tonsils unremarkable. No upper cervical cord lesions. Sinuses/Orbits: Insert sinuses.  LEFT cataract extraction. Other: No mastoid fluid. IMPRESSION: Acute RIGHT thalamic infarct.  No acute hemorrhage. Atrophy and small vessel disease. Progressive ventriculomegaly since 2017 could represent a component of normal pressure hydrocephalus. Electronically Signed   By: Staci Righter M.D.   On: 05/06/2018 12:53   Patient seen and examined.  Clinical course and management discussed.  Necessary edits performed.  I agree with the above.  Assessment and plan of care developed and discussed below.  Assessment: 78 y.o. female with multiple medical issues most pertinent with history of coronary artery disease, diabetes mellitus, hyperlipidemia, hypertension, carotid artery stenosis status post endarterectomy, and renal impairment presenting to the ED with complaints of left sided weakness and numbness x 3 days. MRI of the brain reviewed and shows acute right thalamic infarct.  Etiology likely small vessel disease.  Patient report she was not on any anticoagulant or antiplatelet prior to this event.  Further work-up recommended  Stroke Risk Factors - carotid stenosis, diabetes mellitus, family history, hyperlipidemia and hypertension  Plan: 1. HgbA1c, fasting lipid panel 2. MRA  of the brain without contrast 3. PT consult, OT consult, Speech consult 4. Echocardiogram 5. Carotid dopplers 6. Prophylactic therapy-Antiplatelet med: Aspirin - dose 81 mg 7. NPO  until RN stroke swallow screen 8. Telemetry monitoring 9. Frequent neuro checks  This patient was staffed with Dr. Magda Paganini, Doy Mince who personally evaluated patient, reviewed documentation and agreed with assessment and plan of care as above.  Rufina Falco,  DNP, FNP-BC Board certified Nurse Practitioner Neurology Department  05/06/2018, 2:05 PM       , MD Neurology [phone]  05/06/2018  6:19 PM      

## 2018-05-06 NOTE — ED Notes (Signed)
First Nurse Note: Pt here from PT, did not start PT bc pt was complaining of Left sided weakness that began 2 days ago.

## 2018-05-06 NOTE — ED Provider Notes (Addendum)
[facility] Emergency Department Provider Note   ____________________________________________   None    (approximate)  I have reviewed the triage vital signs and the nursing notes.   HISTORY  Chief Complaint Weakness (Left side)    HPI [name] [name] is a 78 y.o. female who reports she had left-sided weakness and incoordination start on Wednesday came on suddenly.  Has not gotten any better.  She complains of headaches that been going on since she fell and hit her head several weeks ago.  She did not pass out but had a big lump on her head.  She did not see anybody about it.   Past Medical History:  Diagnosis Date  . Abnormal CT lung screening 10/17/2015  . COPD (chronic obstructive pulmonary disease) ([place])   . Coronary artery disease, non-occlusive    a. cath 2006: min nonobs CAD; b. cath 12/2010: cath LAD 50%, RCA 60%; c. 08/2013: Minimal luminal irregs, right dominant system with no significant CAD, diffuse luminal irregs noted. Normal EF 55%, no AS or MS.   . Diabetes mellitus   . Hyperlipemia    Followed by Dr. Rosanna Randy  . Hypertension   . Lung cancer ([place])   . Macular degeneration    rt  . Personal history of tobacco use, presenting hazards to health 10/15/2015  . Pneumonia    hx  . Shortness of breath   . Stroke [facility])     Patient Active Problem List   Diagnosis Date Noted  . Stroke (cerebrum) ([place]) 05/06/2018  . Malnutrition of moderate degree 03/31/2017  . Acute respiratory failure with hypoxia ([place]) 03/30/2017  . AKI (acute kidney injury) ([place]) 07/15/2016  . Protein-calorie malnutrition, severe 02/08/2016  . Primary cancer of right upper lobe of lung ([place]) 11/20/2015  . Abnormal CT lung screening 10/17/2015  . Personal history of tobacco use, presenting hazards to health 10/15/2015  . Chronic vulvitis 09/26/2014  . Allergic reaction 09/26/2014  . Carotid stenosis 08/30/2014  . Cervical nerve root disorder 08/10/2014  . CAD in  native artery 08/10/2014  . B12 deficiency 08/10/2014  . Back ache 08/10/2014  . Bronchitis, chronic ([place]) 08/10/2014  . Diabetes mellitus with polyneuropathy ([place]) 08/10/2014  . Can't get food down 08/10/2014  . Eczema of external ear 08/10/2014  . Accumulation of fluid in tissues 08/10/2014  . Gout 08/10/2014  . Adult hypothyroidism 08/10/2014  . Mononeuritis 08/10/2014  . Muscle ache 08/10/2014  . Disorder of peripheral nervous system 08/10/2014  . Lesion of vulva 08/10/2014  . Cervical spondylosis with radiculopathy 10/19/2013  . COPD exacerbation ([place]) 03/29/2013  . CAD (coronary artery disease) 06/22/2011  . COPD (chronic obstructive pulmonary disease) with emphysema ([place]) 03/07/2010  . CHEST PAIN UNSPECIFIED 07/22/2009  . HLD (hyperlipidemia) 04/01/2009  . Malaise and fatigue 04/01/2009  . Hyperlipidemia 01/18/2009  . TOBACCO ABUSE 01/18/2009  . HYPERTENSION, BENIGN 01/18/2009  . CLAUDICATION 01/18/2009  . Pain in limb 01/18/2009  . CAFL (chronic airflow limitation) ([place]) 01/20/2007  . Late effects of cerebrovascular disease 01/10/2007  . Essential (primary) hypertension 12/22/2006    Past Surgical History:  Procedure Laterality Date  . ABDOMINAL HYSTERECTOMY    . ANTERIOR CERVICAL DECOMP/DISCECTOMY FUSION N/A 10/19/2013   Procedure: CERVICAL FIVE-SIX ANTERIOR CERVICAL DECOMPRESSION WITH FUSION INTERBODY PROSTHESIS PLATING AND PEEK CAGE;  Surgeon: Ophelia Charter, MD;  Location: [facility] NEURO ORS;  Service: Neurosurgery;  Laterality: N/A;  . BACK SURGERY  80's  . BREAST CYST EXCISION Left  left negative   . CARDIAC CATHETERIZATION  05/2004  . CATARACT EXTRACTION Left   . CHOLECYSTECTOMY    . ENDARTERECTOMY Left 10/24/2014   Procedure: ENDARTERECTOMY CAROTID;  Surgeon: Algernon Huxley, MD;  Location: [facility] ORS;  Service: Vascular;  Laterality: Left;  . ENDOBRONCHIAL ULTRASOUND N/A 11/14/2015   Procedure: ENDOBRONCHIAL ULTRASOUND;  Surgeon: [name], MD;  Location: [facility]  ORS;  Service: Cardiopulmonary;  Laterality: N/A;  . PERIPHERAL VASCULAR CATHETERIZATION N/A 12/04/2015   Procedure: Glori Luis Cath Insertion;  Surgeon: Algernon Huxley, MD;  Location: [place] CV LAB;  Service: Cardiovascular;  Laterality: N/A;  . PORTA CATH REMOVAL N/A 05/17/2017   Procedure: PORTA CATH REMOVAL;  Surgeon: Algernon Huxley, MD;  Location: [place] CV LAB;  Service: Cardiovascular;  Laterality: N/A;  . TONSILLECTOMY AND ADENOIDECTOMY    . VESICOVAGINAL FISTULA CLOSURE W/ TAH      Prior to Admission medications   Medication Sig Start Date End Date Taking? Authorizing Provider  atorvastatin (LIPITOR) 40 MG tablet Take 20 mg by mouth at bedtime.  04/13/18  Yes [provider]  Blood Glucose Monitoring Suppl (ONE TOUCH ULTRA 2) w/Device KIT 1 each by Does not apply route See admin instructions. 10/27/17  Yes Jerrol Banana., MD  budesonide-formoterol [facility]) 160-4.5 MCG/ACT inhaler Inhale 2 puffs into the lungs 2 (two) times daily. 09/08/17  Yes Jerrol Banana., MD  fluticasone [facility]) 50 MCG/ACT nasal spray Place 2 sprays into both nostrils daily. 07/21/17  Yes Bacigalupo, Dionne Bucy, MD  gabapentin (NEURONTIN) 600 MG tablet TAKE ONE-HALF TABLET BY  MOUTH TWO TIMES DAILY Patient taking differently: Take 300 mg by mouth 2 (two) times daily.  09/07/17  Yes Jerrol Banana., MD  glimepiride (AMARYL) 2 MG tablet Take 1 tablet (2 mg total) by mouth 2 (two) times daily. 09/08/17  Yes Jerrol Banana., MD  isosorbide mononitrate (IMDUR) 30 MG 24 hr tablet TAKE 1 TABLET BY MOUTH  DAILY Patient taking differently: Take 30 mg by mouth daily.  04/20/18  Yes Jerrol Banana., MD  LORazepam (ATIVAN) 1 MG tablet Take 1 tablet (1 mg total) by mouth 2 (two) times daily as needed for anxiety. 05/24/17  Yes Jerrol Banana., MD  magnesium oxide (MAG-OX) 400 (241.3 MG) MG tablet Take 400 mg by mouth 2 (two) times daily.  08/04/13  Yes [provider]    metoprolol tartrate (LOPRESSOR) 25 MG tablet TAKE 1 TABLET BY MOUTH TWO  TIMES DAILY Patient taking differently: Take 25 mg by mouth 2 (two) times daily.  01/11/18  Yes Jerrol Banana., MD  Multiple Vitamins-Minerals (ICAPS) CAPS Take 1 capsule by mouth 2 (two) times daily.    Yes [provider]  nitroGLYCERIN (NITROSTAT) 0.4 MG SL tablet Place 1 tablet (0.4 mg total) under the tongue every 5 (five) minutes as needed for chest pain. 04/12/17  Yes Jerrol Banana., MD  ONE TOUCH ULTRA TEST test strip USE TO CHECK BLOOD SUGAR ONCE DAILY Patient taking differently: 1 each by Other route daily.  10/27/17  Yes Jerrol Banana., MD  pantoprazole (PROTONIX) 40 MG tablet TAKE 1 TABLET BY MOUTH  DAILY Patient taking differently: Take 40 mg by mouth daily.  01/11/18  Yes Jerrol Banana., MD  albuterol (PROVENTIL) (5 MG/ML) 0.5% nebulizer solution Take 0.5 mLs (2.5 mg total) by nebulization every 6 (six) hours as needed for wheezing or shortness of breath. Patient not  taking: Reported on 05/06/2018 04/03/17   Loletha Grayer, MD  amoxicillin-clavulanate (AUGMENTIN) 875-125 MG tablet Take 1 tablet by mouth 2 (two) times daily. Patient not taking: Reported on 05/06/2018 04/07/18   Jerrol Banana., MD  doxycycline (VIBRA-TABS) 100 MG tablet Take 1 tablet (100 mg total) by mouth 2 (two) times daily. Patient not taking: Reported on 05/06/2018 12/08/17   Jerrol Banana., MD  doxycycline (VIBRA-TABS) 100 MG tablet Take 1 tablet (100 mg total) by mouth 2 (two) times daily. Patient not taking: Reported on 05/06/2018 01/13/18   Jerrol Banana., MD  doxycycline (VIBRA-TABS) 100 MG tablet Take 1 tablet (100 mg total) by mouth 2 (two) times daily. Patient not taking: Reported on 05/06/2018 04/21/18   Jerrol Banana., MD  HYDROcodone-acetaminophen [facility]) 5-325 MG tablet Take 1 tablet by mouth every 6 (six) hours as needed for moderate pain or severe pain. Patient not  taking: Reported on 05/06/2018 07/16/16   Gladstone Lighter, MD  mometasone (ELOCON) 0.1 % cream Apply small amount nightly to each ear Patient not taking: Reported on 05/06/2018 12/29/16   Jerrol Banana., MD  ofloxacin (FLOXIN) 0.3 % OTIC solution PLACE 10 DROPS INTO THE LEFT EAR DAILY. Patient not taking: Reported on 05/06/2018 08/03/17   Jerrol Banana., MD  ondansetron (ZOFRAN) 8 MG tablet Take 1 tablet (8 mg total) by mouth every 8 (eight) hours as needed for nausea or vomiting. Patient not taking: Reported on 05/06/2018 07/17/16   Mar Daring, PA-C  triamcinolone cream (KENALOG) 0.1 % Apply 1 application topically 2 (two) times daily. Patient not taking: Reported on 05/06/2018 09/21/17   Jerrol Banana., MD    Allergies Coconut fatty acids  Family History  Problem Relation Age of Onset  . Stroke Mother        Massive  . Heart attack Father        Massive  . Heart attack Brother   . Heart attack Brother   . Lung cancer Maternal Grandfather   . Heart attack Paternal Grandmother        MI    Social History Social History   Tobacco Use  . Smoking status: Former Smoker    Packs/day: 1.00    Years: 50.00    Pack years: 50.00    Types: Cigarettes    Last attempt to quit: 10/03/2015    Years since quitting: 2.5  . Smokeless tobacco: Never Used  . Tobacco comment: smokes 3 cigs daily 05/06/15. Pt instructed to quit.  Substance Use Topics  . Alcohol use: No    Alcohol/week: 0.0 standard drinks  . Drug use: No    Review of Systems  Constitutional: No fever/chills Eyes: No visual changes. ENT: No sore throat. Cardiovascular: Denies chest pain. Respiratory: Denies shortness of breath. Gastrointestinal: No abdominal pain.  No nausea, no vomiting.  No diarrhea.  No constipation. Genitourinary: Negative for dysuria. Musculoskeletal: Negative for back pain. Skin: Negative for rash. Neurological: See  HPI  ____________________________________________   PHYSICAL EXAM:  VITAL SIGNS: ED Triage Vitals  Enc Vitals Group     BP 05/06/18 1033 (!) 132/58     Pulse Rate 05/06/18 1033 62     Resp --      Temp 05/06/18 1033 97.6 F (36.4 C)     Temp Source 05/06/18 1033 Oral     SpO2 05/06/18 1033 94 %     Weight 05/06/18 1034 149 lb (67.6 kg)  Height 05/06/18 1034 '5\' 5"'  (1.651 m)     Head Circumference --      Peak Flow --      Pain Score 05/06/18 1045 0     Pain Loc --      Pain Edu? --      Excl. in [place]? --     Constitutional: Alert and oriented. Well appearing and in no acute distress. Eyes: Conjunctivae are normal. PER. EOMI. Head: Atraumatic. Nose: No congestion/rhinnorhea. Mouth/Throat: Mucous membranes are moist.  Oropharynx non-erythematous. Neck: No stridor. Cardiovascular: Normal rate, regular rhythm. Grossly normal heart sounds.  Good peripheral circulation. Respiratory: Normal respiratory effort.  No retractions. Lungs CTAB. Gastrointestinal: Soft and nontender. No distention. No abdominal bruits. No CVA tenderness. Musculoskeletal: No lower extremity tenderness nor edema.   Neurologic:  Normal speech and language.  Motor strength is 5/5 throughout.  Patient does not report any numbness.  Cranial nerves II through XII appear to be intact although visual fields were not checked.  Cerebellar finger-nose is slowed and slightly ataxic bilaterally heel-to-shin is perhaps slightly worse on the left rapid alternating movements and hands is worse on the left. Skin:  Skin is warm, dry and intact. No rash noted. Psychiatric: Mood and affect are normal. Speech and behavior are normal.  ____________________________________________   LABS (all labs ordered are listed, but only abnormal results are displayed)  Labs Reviewed  CBC WITH DIFFERENTIAL/PLATELET - Abnormal; Notable for the following components:      Result Value   RBC 3.68 (*)    Hemoglobin 11.6 (*)    HCT 35.3  (*)    All other components within normal limits  BASIC METABOLIC PANEL - Abnormal; Notable for the following components:   Glucose, Bld 344 (*)    BUN 30 (*)    Creatinine, Ser 1.32 (*)    GFR calc non Af Amer 39 (*)    GFR calc Af Amer 45 (*)    All other components within normal limits  GLUCOSE, CAPILLARY - Abnormal; Notable for the following components:   Glucose-Capillary 207 (*)    All other components within normal limits  HEMOGLOBIN A1C  TSH   ____________________________________________  EKG 6 EKG read interpreted by me shows normal sinus rhythm rate of 63 normal axis there is a flipped T in lead L which is barely present was not present earlier. ____________________________________________  RADIOLOGY  ED MD interpretation: CT of the head and neck read by radiologist as no acute disease possible early hydrocephalus.  MRI shows acute stroke and possible normal pressure hydrocephalus.  Official radiology report(s): Ct Head Wo Contrast  Result Date: 05/06/2018 CLINICAL DATA:  Left-sided weakness for 2 days. Multiple falls. EXAM: CT HEAD WITHOUT CONTRAST CT CERVICAL SPINE WITHOUT CONTRAST TECHNIQUE: Multidetector CT imaging of the head and cervical spine was performed following the standard protocol without intravenous contrast. Multiplanar CT image reconstructions of the cervical spine were also generated. COMPARISON:  Head CT 03/30/2017 and MRI 11/20/2015. Cervical spine MRI 09/11/2013. FINDINGS: CT HEAD FINDINGS Brain: There is no evidence of acute infarct, intracranial hemorrhage, mass, midline shift, or extra-axial fluid collection. Mild ventriculomegaly has minimally progressed from the prior CT and is favored to reflect central predominant cerebral atrophy with communicating hydrocephalus less likely. Patchy cerebral white matter hypodensities may have also slightly progressed and are nonspecific but compatible with mild-to-moderate chronic small vessel ischemic disease.  Vascular: Calcified atherosclerosis at the skull base. No hyperdense vessel. Skull: No fracture or suspicious osseous lesion. Sinuses/Orbits: Small  volume sphenoid sinus fluid, less than on the prior CT. Clear mastoid air cells. Left cataract extraction. Other: None. CT CERVICAL SPINE FINDINGS Alignment: Chronic cervical spine straightening. Trace fused retrolisthesis of C5 on C6. Skull base and vertebrae: C5-6 ACDF with solid interbody arthrodesis as well as right facet ankylosis. No acute fracture or suspicious osseous lesion. Soft tissues and spinal canal: No prevertebral fluid or swelling. No visible canal hematoma. Disc levels: Mild cervical spondylosis. No osseous spinal canal or neural foraminal stenosis. Upper chest: Centrilobular emphysema. Other: Asymmetric enlargement of the right thyroid lobe with multiple small nodules posteriorly. Left carotid endarterectomy. Mild right carotid artery calcification with retropharyngeal course of the proximal right ICA. IMPRESSION: 1. No evidence of acute intracranial abnormality. 2. Mild-to-moderate chronic small vessel ischemic disease and cerebral atrophy. 3. No evidence of acute fracture or traumatic subluxation in the cervical spine. Electronically Signed   By: Logan Bores M.D.   On: 05/06/2018 12:28   Ct Cervical Spine Wo Contrast  Result Date: 05/06/2018 CLINICAL DATA:  Left-sided weakness for 2 days. Multiple falls. EXAM: CT HEAD WITHOUT CONTRAST CT CERVICAL SPINE WITHOUT CONTRAST TECHNIQUE: Multidetector CT imaging of the head and cervical spine was performed following the standard protocol without intravenous contrast. Multiplanar CT image reconstructions of the cervical spine were also generated. COMPARISON:  Head CT 03/30/2017 and MRI 11/20/2015. Cervical spine MRI 09/11/2013. FINDINGS: CT HEAD FINDINGS Brain: There is no evidence of acute infarct, intracranial hemorrhage, mass, midline shift, or extra-axial fluid collection. Mild ventriculomegaly has  minimally progressed from the prior CT and is favored to reflect central predominant cerebral atrophy with communicating hydrocephalus less likely. Patchy cerebral white matter hypodensities may have also slightly progressed and are nonspecific but compatible with mild-to-moderate chronic small vessel ischemic disease. Vascular: Calcified atherosclerosis at the skull base. No hyperdense vessel. Skull: No fracture or suspicious osseous lesion. Sinuses/Orbits: Small volume sphenoid sinus fluid, less than on the prior CT. Clear mastoid air cells. Left cataract extraction. Other: None. CT CERVICAL SPINE FINDINGS Alignment: Chronic cervical spine straightening. Trace fused retrolisthesis of C5 on C6. Skull base and vertebrae: C5-6 ACDF with solid interbody arthrodesis as well as right facet ankylosis. No acute fracture or suspicious osseous lesion. Soft tissues and spinal canal: No prevertebral fluid or swelling. No visible canal hematoma. Disc levels: Mild cervical spondylosis. No osseous spinal canal or neural foraminal stenosis. Upper chest: Centrilobular emphysema. Other: Asymmetric enlargement of the right thyroid lobe with multiple small nodules posteriorly. Left carotid endarterectomy. Mild right carotid artery calcification with retropharyngeal course of the proximal right ICA. IMPRESSION: 1. No evidence of acute intracranial abnormality. 2. Mild-to-moderate chronic small vessel ischemic disease and cerebral atrophy. 3. No evidence of acute fracture or traumatic subluxation in the cervical spine. Electronically Signed   By: Logan Bores M.D.   On: 05/06/2018 12:28   Mr Brain Wo Contrast  Result Date: 05/06/2018 CLINICAL DATA:  LEFT-sided weakness began 2 days ago. EXAM: MRI HEAD WITHOUT CONTRAST TECHNIQUE: Multiplanar, multiecho pulse sequences of the brain and surrounding structures were obtained without intravenous contrast. COMPARISON:  CT head 05/06/2018.  MR head 11/20/2015. FINDINGS: Brain: 5 mm focus  of restricted diffusion affects the RIGHT medial dorsal thalamus, corresponding low ADC, consistent with acute infarction. No other areas of acute stroke are identified. No hemorrhage, mass lesion,  or extra-axial fluid. Premature for age cerebral and cerebellar atrophy. T2 and FLAIR hyperintensities throughout the periventricular and subcortical white matter consistent with small vessel disease. Facilitated diffusion involves a LEFT  parietal periventricular area of gliosis. Ventriculomegaly out of proportion to the degree of cortical atrophy, with continuous periventricular T2 high signal, raises question normal pressure hydrocephalus. Vascular: Flow voids are maintained. Tiny focus of chronic hemorrhage, LEFT posterior temporal subcortical white matter. Skull and upper cervical spine: Unremarkable visualized calvarium, skullbase, and cervical vertebrae. Pituitary, pineal, cerebellar tonsils unremarkable. No upper cervical cord lesions. Sinuses/Orbits: Insert sinuses.  LEFT cataract extraction. Other: No mastoid fluid. IMPRESSION: Acute RIGHT thalamic infarct.  No acute hemorrhage. Atrophy and small vessel disease. Progressive ventriculomegaly since 2017 could represent a component of normal pressure hydrocephalus. Electronically Signed   By: Staci Righter M.D.   On: 05/06/2018 12:53   US Carotid Bilateral  Result Date: 05/06/2018 CLINICAL DATA:  Stroke EXAM: BILATERAL CAROTID DUPLEX ULTRASOUND TECHNIQUE: Pearline Cables scale imaging, color Doppler and duplex ultrasound were performed of bilateral carotid and vertebral arteries in the neck. COMPARISON:  08/30/2014 FINDINGS: Criteria: Quantification of carotid stenosis is based on velocity parameters that correlate the residual internal carotid diameter with NASCET-based stenosis levels, using the diameter of the distal internal carotid lumen as the denominator for stenosis measurement. The following velocity measurements were obtained: RIGHT ICA: 81/20 cm/sec CCA: 64/31  cm/sec SYSTOLIC ICA/CCA RATIO:  1.2 ECA: 95 cm/sec LEFT ICA: 82/23 cm/sec CCA: 42/76 cm/sec SYSTOLIC ICA/CCA RATIO:  1.3 ECA: 106 cm/sec RIGHT CAROTID ARTERY: Intimal thickening in the common carotid artery. Mild eccentric plaque in the bulb and ICA origin. No high-grade stenosis. Normal waveforms and color Doppler signal. RIGHT VERTEBRAL ARTERY:  Normal flow direction and waveform. LEFT CAROTID ARTERY: Smooth nonocclusive plaque in the common carotid artery. Eccentric hypoechoic plaque in the bulb and proximal ICA resulting in at least mild stenosis. Normal waveforms and color Doppler signal however. LEFT VERTEBRAL ARTERY:  Normal flow direction and waveform. IMPRESSION: 1. Bilateral carotid bifurcation and proximal ICA plaque, resulting in less than 50% diameter ICA stenosis. 2.  Antegrade bilateral vertebral arterial flow. Electronically Signed   By: Lucrezia Europe M.D.   On: 05/06/2018 15:17    ____________________________________________   PROCEDURES  Procedure(s) performed:   Procedures  Critical Care performed: Acute care time half an hour.  This includes reviewing the CT MRI discussing patient with the neurologist hospitalist and the patient herself and her family.  ____________________________________________   INITIAL IMPRESSION / ASSESSMENT AND PLAN / ED COURSE   We will get the patient in the hospital and work on her acute stroke which is started 2 days ago.  I discussed the patient with Dr. Doy Mince and with the hospitalist.         ____________________________________________   FINAL CLINICAL IMPRESSION(S) / ED DIAGNOSES  Final diagnoses:  Cerebrovascular accident (CVA), unspecified mechanism [facility])     ED Discharge Orders    None       Note:  This document was prepared using Dragon voice recognition software and may include unintentional dictation errors.    Nena Polio, MD 05/06/18 1600    Nena Polio, MD 05/17/18 [phone]

## 2018-05-06 NOTE — H&P (Signed)
[facility] NAME: [name] [name]    MR#:  [id number]  DATE OF BIRTH:  March 18, 1941  DATE OF ADMISSION:  05/06/2018  PRIMARY CARE PHYSICIAN: Jerrol Banana., MD   REQUESTING/REFERRING PHYSICIAN: Dr. Conni Slipper  CHIEF COMPLAINT:   Chief Complaint  Patient presents with  . Weakness    Left side    HISTORY OF PRESENT ILLNESS:  [name] [name]  is a 78 y.o. female with a known history of stage III small cell lung cancer of right upper lobe currently in remission, peripheral neuropathy, hypertension, diabetes, history of smoking and macular degeneration comes from outpatient physical therapy secondary to left arm and facial weakness and sensory changes. Patient has noticed tingling numbness and weakness of the left arm and facial changes about 2 days ago.  However she was still able to get on normally so did not pay much attention.  When she arrived with her daughter for outpatient physical therapy today, they have mentioned it to the therapist and she was sent to the emergency room.  MRI here showed right thalamic infarct.  She is being admitted for further stroke work-up.  Patient has had history of left carotid endarterectomy for occlusion in the past.  She denies any speech trouble or swallowing trouble.  Denies any nausea, vomiting, fevers or chills or chest pain or palpitations.  She is not on any aspirin at home.  Labs are otherwise unremarkable except elevated glucose.  MRI showing right thalamic infarct.  PAST MEDICAL HISTORY:   Past Medical History:  Diagnosis Date  . Abnormal CT lung screening 10/17/2015  . COPD (chronic obstructive pulmonary disease) ([place])   . Coronary artery disease, non-occlusive    a. cath 2006: min nonobs CAD; b. cath 12/2010: cath LAD 50%, RCA 60%; c. 08/2013: Minimal luminal irregs, right dominant system with no significant CAD, diffuse luminal irregs noted. Normal EF 55%, no AS or MS.   . Diabetes  mellitus   . Hyperlipemia    Followed by Dr. Rosanna Randy  . Hypertension   . Lung cancer ([place])   . Macular degeneration    rt  . Personal history of tobacco use, presenting hazards to health 10/15/2015  . Pneumonia    hx  . Shortness of breath   . Stroke [facility])     PAST SURGICAL HISTORY:   Past Surgical History:  Procedure Laterality Date  . ABDOMINAL HYSTERECTOMY    . ANTERIOR CERVICAL DECOMP/DISCECTOMY FUSION N/A 10/19/2013   Procedure: CERVICAL FIVE-SIX ANTERIOR CERVICAL DECOMPRESSION WITH FUSION INTERBODY PROSTHESIS PLATING AND PEEK CAGE;  Surgeon: Ophelia Charter, MD;  Location: [facility];  Service: Neurosurgery;  Laterality: N/A;  . BACK SURGERY  80's  . BREAST CYST EXCISION Left    left negative   . CARDIAC CATHETERIZATION  05/2004  . CATARACT EXTRACTION Left   . CHOLECYSTECTOMY    . ENDARTERECTOMY Left 10/24/2014   Procedure: ENDARTERECTOMY CAROTID;  Surgeon: Algernon Huxley, MD;  Location: [facility];  Service: Vascular;  Laterality: Left;  . ENDOBRONCHIAL ULTRASOUND N/A 11/14/2015   Procedure: ENDOBRONCHIAL ULTRASOUND;  Surgeon: [name], MD;  Location: [facility];  Service: Cardiopulmonary;  Laterality: N/A;  . PERIPHERAL VASCULAR CATHETERIZATION N/A 12/04/2015   Procedure: Glori Luis Cath Insertion;  Surgeon: Algernon Huxley, MD;  Location: [facility];  Service: Cardiovascular;  Laterality: N/A;  . PORTA CATH REMOVAL N/A 05/17/2017   Procedure: PORTA CATH REMOVAL;  Surgeon: Algernon Huxley,  MD;  Location: [facility];  Service: Cardiovascular;  Laterality: N/A;  . TONSILLECTOMY AND ADENOIDECTOMY    . VESICOVAGINAL FISTULA CLOSURE W/ TAH      SOCIAL HISTORY:   Social History   Tobacco Use  . Smoking status: Former Smoker    Packs/day: 1.00    Years: 50.00    Pack years: 50.00    Types: Cigarettes    Last attempt to quit: 10/03/2015    Years since quitting: 2.5  . Smokeless tobacco: Never Used  . Tobacco comment: smokes 3 cigs daily 05/06/15. Pt instructed to  quit.  Substance Use Topics  . Alcohol use: No    Alcohol/week: 0.0 standard drinks    FAMILY HISTORY:   Family History  Problem Relation Age of Onset  . Stroke Mother        Massive  . Heart attack Father        Massive  . Heart attack Brother   . Heart attack Brother   . Lung cancer Maternal Grandfather   . Heart attack Paternal Grandmother        MI    DRUG ALLERGIES:   Allergies  Allergen Reactions  . Coconut Fatty Acids Swelling and Other (See Comments)    Throat swells    REVIEW OF SYSTEMS:   Review of Systems  Constitutional: Negative for chills, fever, malaise/fatigue and weight loss.  HENT: Negative for ear discharge, ear pain, hearing loss, nosebleeds and tinnitus.   Eyes: Negative for blurred vision, double vision and photophobia.  Respiratory: Negative for cough, hemoptysis, shortness of breath and wheezing.   Cardiovascular: Negative for chest pain, palpitations, orthopnea and leg swelling.  Gastrointestinal: Negative for abdominal pain, constipation, diarrhea, heartburn, melena, nausea and vomiting.  Genitourinary: Negative for dysuria, frequency, hematuria and urgency.  Musculoskeletal: Negative for back pain, myalgias and neck pain.  Skin: Negative for rash.  Neurological: Positive for sensory change and focal weakness. Negative for dizziness, tingling, tremors, speech change and headaches.  Endo/Heme/Allergies: Does not bruise/bleed easily.  Psychiatric/Behavioral: Negative for depression.    MEDICATIONS AT HOME:   Prior to Admission medications   Medication Sig Start Date End Date Taking? Authorizing Provider  albuterol (PROVENTIL) (5 MG/ML) 0.5% nebulizer solution Take 0.5 mLs (2.5 mg total) by nebulization every 6 (six) hours as needed for wheezing or shortness of breath. Patient taking differently: Take 2.5 mg by nebulization daily.  04/03/17   Loletha Grayer, MD  amoxicillin-clavulanate (AUGMENTIN) 875-125 MG tablet Take 1 tablet by mouth 2  (two) times daily. 04/07/18   Jerrol Banana., MD  Blood Glucose Monitoring Suppl (ONE TOUCH ULTRA 2) w/Device KIT 1 each by Does not apply route See admin instructions. 10/27/17   Jerrol Banana., MD  budesonide-formoterol [facility]) 160-4.5 MCG/ACT inhaler Inhale 2 puffs into the lungs 2 (two) times daily. 09/08/17   Jerrol Banana., MD  doxycycline (VIBRA-TABS) 100 MG tablet Take 1 tablet (100 mg total) by mouth 2 (two) times daily. 12/08/17   Jerrol Banana., MD  doxycycline (VIBRA-TABS) 100 MG tablet Take 1 tablet (100 mg total) by mouth 2 (two) times daily. 01/13/18   Jerrol Banana., MD  doxycycline (VIBRA-TABS) 100 MG tablet Take 1 tablet (100 mg total) by mouth 2 (two) times daily. 04/21/18   Jerrol Banana., MD  fluticasone [facility]) 50 MCG/ACT nasal spray Place 2 sprays into both nostrils daily. 07/21/17   Virginia Crews, MD  gabapentin (NEURONTIN) 600  MG tablet TAKE ONE-HALF TABLET BY  MOUTH TWO TIMES DAILY 09/07/17   Jerrol Banana., MD  glimepiride (AMARYL) 2 MG tablet Take 1 tablet (2 mg total) by mouth 2 (two) times daily. 09/08/17   Jerrol Banana., MD  HYDROcodone-acetaminophen (NORCO) 5-325 MG tablet Take 1 tablet by mouth every 6 (six) hours as needed for moderate pain or severe pain. 07/16/16   Gladstone Lighter, MD  isosorbide mononitrate (IMDUR) 30 MG 24 hr tablet TAKE 1 TABLET BY MOUTH  DAILY 04/20/18   Jerrol Banana., MD  LORazepam (ATIVAN) 1 MG tablet Take 1 tablet (1 mg total) by mouth 2 (two) times daily as needed for anxiety. 05/24/17   Jerrol Banana., MD  magnesium oxide (MAG-OX) 400 (241.3 MG) MG tablet Take 400 mg by mouth 2 (two) times daily.  08/04/13   [provider]  metoprolol tartrate (LOPRESSOR) 25 MG tablet TAKE 1 TABLET BY MOUTH TWO  TIMES DAILY 01/11/18   Jerrol Banana., MD  mometasone (ELOCON) 0.1 % cream Apply small amount nightly to each ear 12/29/16   Jerrol Banana., MD    Multiple Vitamins-Minerals (ICAPS) CAPS Take 1 capsule by mouth 2 (two) times daily.     [provider]  nitroGLYCERIN (NITROSTAT) 0.4 MG SL tablet Place 1 tablet (0.4 mg total) under the tongue every 5 (five) minutes as needed for chest pain. 04/12/17   Jerrol Banana., MD  ofloxacin (FLOXIN) 0.3 % OTIC solution PLACE 10 DROPS INTO THE LEFT EAR DAILY. 08/03/17   Jerrol Banana., MD  ondansetron (ZOFRAN) 8 MG tablet Take 1 tablet (8 mg total) by mouth every 8 (eight) hours as needed for nausea or vomiting. 07/17/16   Mar Daring, PA-C  ONE TOUCH ULTRA TEST test strip USE TO CHECK BLOOD SUGAR ONCE DAILY 10/27/17   Jerrol Banana., MD  pantoprazole (PROTONIX) 40 MG tablet TAKE 1 TABLET BY MOUTH  DAILY 01/11/18   Jerrol Banana., MD  triamcinolone cream (KENALOG) 0.1 % Apply 1 application topically 2 (two) times daily. 09/21/17   Jerrol Banana., MD      VITAL SIGNS:  Blood pressure 127/80, pulse 62, temperature 97.6 F (36.4 C), temperature source Oral, resp. rate 16, height _0  (1.651 m), weight 67.6 kg, SpO2 94 %.  PHYSICAL EXAMINATION:   Physical Exam  GENERAL:  78 y.o.-year-old patient lying in the bed with no acute distress.  EYES: Pupils equal, round, reactive to light and accommodation. No scleral icterus. Extraocular muscles intact.  HEENT: Head atraumatic, normocephalic. Oropharynx and nasopharynx clear.  NECK:  Supple, no jugular venous distention. No thyroid enlargement, no tenderness.  LUNGS: Normal breath sounds bilaterally, no wheezing, rales,rhonchi or crepitation. No use of accessory muscles of respiration.  Decreased bibasilar breath sounds. CARDIOVASCULAR: S1, S2 normal. No  rubs, or gallops.  2/6 systolic murmur is present.  ABDOMEN: Soft, nontender, nondistended. Bowel sounds present. No organomegaly or mass.  EXTREMITIES: No pedal edema, cyanosis, or clubbing.  NEUROLOGIC: Cranial nerves II through XII are intact. Muscle  strength 5/5 in all extremities.  Decreased sensation to light touch on the left face and left upper extremity. Gait not checked.  PSYCHIATRIC: The patient is alert and oriented x 3.  SKIN: No obvious rash, lesion, or ulcer.   LABORATORY PANEL:   CBC Recent Labs  Lab 05/06/18 1058  WBC 7.4  HGB 11.6*  HCT 35.3*  PLT  203   ------------------------------------------------------------------------------------------------------------------  Chemistries  Recent Labs  Lab 05/06/18 1058  NA 135  K 4.3  CL 99  CO2 27  GLUCOSE 344*  BUN 30*  CREATININE 1.32*  CALCIUM 9.2   ------------------------------------------------------------------------------------------------------------------  Cardiac Enzymes No results for input(s): TROPONINI in the last 168 hours. ------------------------------------------------------------------------------------------------------------------  RADIOLOGY:  Ct Head Wo Contrast  Result Date: 05/06/2018 CLINICAL DATA:  Left-sided weakness for 2 days. Multiple falls. EXAM: CT HEAD WITHOUT CONTRAST CT CERVICAL SPINE WITHOUT CONTRAST TECHNIQUE: Multidetector CT imaging of the head and cervical spine was performed following the standard protocol without intravenous contrast. Multiplanar CT image reconstructions of the cervical spine were also generated. COMPARISON:  Head CT 03/30/2017 and MRI 11/20/2015. Cervical spine MRI 09/11/2013. FINDINGS: CT HEAD FINDINGS Brain: There is no evidence of acute infarct, intracranial hemorrhage, mass, midline shift, or extra-axial fluid collection. Mild ventriculomegaly has minimally progressed from the prior CT and is favored to reflect central predominant cerebral atrophy with communicating hydrocephalus less likely. Patchy cerebral white matter hypodensities may have also slightly progressed and are nonspecific but compatible with mild-to-moderate chronic small vessel ischemic disease. Vascular: Calcified atherosclerosis at the  skull base. No hyperdense vessel. Skull: No fracture or suspicious osseous lesion. Sinuses/Orbits: Small volume sphenoid sinus fluid, less than on the prior CT. Clear mastoid air cells. Left cataract extraction. Other: None. CT CERVICAL SPINE FINDINGS Alignment: Chronic cervical spine straightening. Trace fused retrolisthesis of C5 on C6. Skull base and vertebrae: C5-6 ACDF with solid interbody arthrodesis as well as right facet ankylosis. No acute fracture or suspicious osseous lesion. Soft tissues and spinal canal: No prevertebral fluid or swelling. No visible canal hematoma. Disc levels: Mild cervical spondylosis. No osseous spinal canal or neural foraminal stenosis. Upper chest: Centrilobular emphysema. Other: Asymmetric enlargement of the right thyroid lobe with multiple small nodules posteriorly. Left carotid endarterectomy. Mild right carotid artery calcification with retropharyngeal course of the proximal right ICA. IMPRESSION: 1. No evidence of acute intracranial abnormality. 2. Mild-to-moderate chronic small vessel ischemic disease and cerebral atrophy. 3. No evidence of acute fracture or traumatic subluxation in the cervical spine. Electronically Signed   By: Logan Bores M.D.   On: 05/06/2018 12:28   Ct Cervical Spine Wo Contrast  Result Date: 05/06/2018 CLINICAL DATA:  Left-sided weakness for 2 days. Multiple falls. EXAM: CT HEAD WITHOUT CONTRAST CT CERVICAL SPINE WITHOUT CONTRAST TECHNIQUE: Multidetector CT imaging of the head and cervical spine was performed following the standard protocol without intravenous contrast. Multiplanar CT image reconstructions of the cervical spine were also generated. COMPARISON:  Head CT 03/30/2017 and MRI 11/20/2015. Cervical spine MRI 09/11/2013. FINDINGS: CT HEAD FINDINGS Brain: There is no evidence of acute infarct, intracranial hemorrhage, mass, midline shift, or extra-axial fluid collection. Mild ventriculomegaly has minimally progressed from the prior CT and  is favored to reflect central predominant cerebral atrophy with communicating hydrocephalus less likely. Patchy cerebral white matter hypodensities may have also slightly progressed and are nonspecific but compatible with mild-to-moderate chronic small vessel ischemic disease. Vascular: Calcified atherosclerosis at the skull base. No hyperdense vessel. Skull: No fracture or suspicious osseous lesion. Sinuses/Orbits: Small volume sphenoid sinus fluid, less than on the prior CT. Clear mastoid air cells. Left cataract extraction. Other: None. CT CERVICAL SPINE FINDINGS Alignment: Chronic cervical spine straightening. Trace fused retrolisthesis of C5 on C6. Skull base and vertebrae: C5-6 ACDF with solid interbody arthrodesis as well as right facet ankylosis. No acute fracture or suspicious osseous lesion. Soft tissues and spinal canal: No prevertebral  fluid or swelling. No visible canal hematoma. Disc levels: Mild cervical spondylosis. No osseous spinal canal or neural foraminal stenosis. Upper chest: Centrilobular emphysema. Other: Asymmetric enlargement of the right thyroid lobe with multiple small nodules posteriorly. Left carotid endarterectomy. Mild right carotid artery calcification with retropharyngeal course of the proximal right ICA. IMPRESSION: 1. No evidence of acute intracranial abnormality. 2. Mild-to-moderate chronic small vessel ischemic disease and cerebral atrophy. 3. No evidence of acute fracture or traumatic subluxation in the cervical spine. Electronically Signed   By: Logan Bores M.D.   On: 05/06/2018 12:28   Mr Brain Wo Contrast  Result Date: 05/06/2018 CLINICAL DATA:  LEFT-sided weakness began 2 days ago. EXAM: MRI HEAD WITHOUT CONTRAST TECHNIQUE: Multiplanar, multiecho pulse sequences of the brain and surrounding structures were obtained without intravenous contrast. COMPARISON:  CT head 05/06/2018.  MR head 11/20/2015. FINDINGS: Brain: 5 mm focus of restricted diffusion affects the RIGHT  medial dorsal thalamus, corresponding low ADC, consistent with acute infarction. No other areas of acute stroke are identified. No hemorrhage, mass lesion,  or extra-axial fluid. Premature for age cerebral and cerebellar atrophy. T2 and FLAIR hyperintensities throughout the periventricular and subcortical white matter consistent with small vessel disease. Facilitated diffusion involves a LEFT parietal periventricular area of gliosis. Ventriculomegaly out of proportion to the degree of cortical atrophy, with continuous periventricular T2 high signal, raises question normal pressure hydrocephalus. Vascular: Flow voids are maintained. Tiny focus of chronic hemorrhage, LEFT posterior temporal subcortical white matter. Skull and upper cervical spine: Unremarkable visualized calvarium, skullbase, and cervical vertebrae. Pituitary, pineal, cerebellar tonsils unremarkable. No upper cervical cord lesions. Sinuses/Orbits: Insert sinuses.  LEFT cataract extraction. Other: No mastoid fluid. IMPRESSION: Acute RIGHT thalamic infarct.  No acute hemorrhage. Atrophy and small vessel disease. Progressive ventriculomegaly since 2017 could represent a component of normal pressure hydrocephalus. Electronically Signed   By: Staci Righter M.D.   On: 05/06/2018 12:53    EKG:   Orders placed or performed during the hospital encounter of 05/06/18  . ED EKG  . ED EKG    IMPRESSION AND PLAN:   [name] [name]  is a 78 y.o. female with a known history of stage III small cell lung cancer of right upper lobe currently in remission, peripheral neuropathy, hypertension, diabetes, history of smoking and macular degeneration comes from outpatient physical therapy secondary to left arm and facial weakness and sensory changes.  1.  Acute CVA-MRI of the brain with acute right thalamic infarct and no acute hemorrhage.  Also shows atrophy and small vessel disease.  Progressive ventriculomegaly noted as well. -Admit, neurochecks, neurology  consult. -Carotid Dopplers and echocardiogram ordered -Started on aspirin, statin.  Check LDL -PT/OT and speech therapy consults.  2.  Diabetes mellitus with hyperglycemia-check A1c.  Restart home medications. -Also sliding scale insulin for now  3.  GERD-continue Protonix  4.  COPD-continue home inhalers.  No active exacerbation.  5.  History of lung cancer-currently in remission.  Follows with Dr. [name] at the cancer center  6.  DVT prophylaxis-Lovenox  Plan updated with family at bedside   All the records are reviewed and case discussed with ED provider. Management plans discussed with the patient, family and they are in agreement.  CODE STATUS: Full Code  TOTAL TIME TAKING CARE OF THIS PATIENT: 51 minutes.    Gladstone Lighter M.D on 05/06/2018 at 1:39 PM  Between 7am to 6pm - Pager - [phone]  After 6pm go to [email or web address] - [place]  [facility]  Office  [phone]  CC: Primary care physician; Jerrol Banana., MD

## 2018-05-06 NOTE — ED Notes (Signed)
ED TO INPATIENT HANDOFF REPORT  ED Nurse Name and Phone #:  [place] Police [phone]  Name/Age/Gender [name] [name] 78 y.o. female Room/Bed: ED24A/ED24A  Code Status   Code Status: Prior  Home/SNF/Other Home A&O x 4  Is this baseline? no  Triage Complete: Triage complete  Chief Complaint L sided weakness   Triage Note Patient presents with Left sided weakness that started on Wednesday. Has been seeing physical therapy since September for a fall. Patient reports falling a lot due to being off balance from legs. Patient ambulates with cane   Allergies Allergies  Allergen Reactions  . Coconut Fatty Acids Swelling and Other (See Comments)    Throat swells    Level of Care/Admitting Diagnosis ED Disposition    ED Disposition Condition [facility]: [place] [[id number]]  Level of Care: Med-Surg [16]  Diagnosis: Stroke (cerebrum) [facility]) [[id number]]  Admitting Physician: [place], [place]  Attending Physician: Gladstone Lighter [[id number]]  Estimated length of stay: past midnight tomorrow  Certification:: I certify this patient will need inpatient services for at least 2 midnights  PT Class (Do Not Modify): Inpatient [101]  PT Acc Code (Do Not Modify): Private [1]       Medical/Surgery History Past Medical History:  Diagnosis Date  . Abnormal CT lung screening 10/17/2015  . COPD (chronic obstructive pulmonary disease) ([place])   . Coronary artery disease, non-occlusive    a. cath 2006: min nonobs CAD; b. cath 12/2010: cath LAD 50%, RCA 60%; c. 08/2013: Minimal luminal irregs, right dominant system with no significant CAD, diffuse luminal irregs noted. Normal EF 55%, no AS or MS.   . Diabetes mellitus   . Hyperlipemia    Followed by Dr. Rosanna Randy  . Hypertension   . Lung cancer ([place])   . Macular degeneration    rt  . Personal history of tobacco use, presenting hazards to health 10/15/2015  . Pneumonia    hx  . Shortness of breath   .  Stroke [facility])    Past Surgical History:  Procedure Laterality Date  . ABDOMINAL HYSTERECTOMY    . ANTERIOR CERVICAL DECOMP/DISCECTOMY FUSION N/A 10/19/2013   Procedure: CERVICAL FIVE-SIX ANTERIOR CERVICAL DECOMPRESSION WITH FUSION INTERBODY PROSTHESIS PLATING AND PEEK CAGE;  Surgeon: Ophelia Charter, MD;  Location: [place] [facility];  Service: Neurosurgery;  Laterality: N/A;  . BACK SURGERY  80's  . BREAST CYST EXCISION Left    left negative   . CARDIAC CATHETERIZATION  05/2004  . CATARACT EXTRACTION Left   . CHOLECYSTECTOMY    . ENDARTERECTOMY Left 10/24/2014   Procedure: ENDARTERECTOMY CAROTID;  Surgeon: Algernon Huxley, MD;  Location: [facility] ORS;  Service: Vascular;  Laterality: Left;  . ENDOBRONCHIAL ULTRASOUND N/A 11/14/2015   Procedure: ENDOBRONCHIAL ULTRASOUND;  Surgeon: [name], MD;  Location: [facility] ORS;  Service: Cardiopulmonary;  Laterality: N/A;  . PERIPHERAL VASCULAR CATHETERIZATION N/A 12/04/2015   Procedure: Glori Luis Cath Insertion;  Surgeon: Algernon Huxley, MD;  Location: [place] CV LAB;  Service: Cardiovascular;  Laterality: N/A;  . PORTA CATH REMOVAL N/A 05/17/2017   Procedure: PORTA CATH REMOVAL;  Surgeon: Algernon Huxley, MD;  Location: [place] CV LAB;  Service: Cardiovascular;  Laterality: N/A;  . TONSILLECTOMY AND ADENOIDECTOMY    . VESICOVAGINAL FISTULA CLOSURE W/ TAH       IV Location/Drains/Wounds Patient Lines/Drains/Airways Status   Active Line/Drains/Airways    Name:   Placement date:   Placement time:   Site:  Days:   Implanted Port 12/04/15 Left Chest   12/04/15    0905    Chest   884   Implanted Port 03/31/17 Left Chest   03/31/17    0430    Chest   401   Peripheral IV 05/06/18 Left;Posterior;Medial Forearm   05/06/18    1405    Forearm   less than 1          Intake/Output Last 24 hours No intake or output data in the 24 hours ending 05/06/18 1418  Labs/Imaging Results for orders placed or performed during the hospital encounter of 05/06/18 (from the  past 48 hour(s))  CBC with Differential     Status: Abnormal   Collection Time: 05/06/18 10:58 AM  Result Value Ref Range   WBC 7.4 4.0 - 10.5 K/uL   RBC 3.68 (L) 3.87 - 5.11 MIL/uL   Hemoglobin 11.6 (L) 12.0 - 15.0 g/dL   HCT 35.3 (L) 36.0 - 46.0 %   MCV 95.9 80.0 - 100.0 fL   MCH 31.5 26.0 - 34.0 pg   MCHC 32.9 30.0 - 36.0 g/dL   RDW 13.5 11.5 - 15.5 %   Platelets 203 150 - 400 K/uL   nRBC 0.0 0.0 - 0.2 %   Neutrophils Relative % 80 %   Neutro Abs 5.9 1.7 - 7.7 K/uL   Lymphocytes Relative 12 %   Lymphs Abs 0.9 0.7 - 4.0 K/uL   Monocytes Relative 7 %   Monocytes Absolute 0.5 0.1 - 1.0 K/uL   Eosinophils Relative 1 %   Eosinophils Absolute 0.1 0.0 - 0.5 K/uL   Basophils Relative 0 %   Basophils Absolute 0.0 0.0 - 0.1 K/uL   Immature Granulocytes 0 %   Abs Immature Granulocytes 0.03 0.00 - 0.07 K/uL    Comment: Performed at [facility], [address] [place], [place] [address]  Basic metabolic panel     Status: Abnormal   Collection Time: 05/06/18 10:58 AM  Result Value Ref Range   Sodium 135 135 - 145 mmol/L   Potassium 4.3 3.5 - 5.1 mmol/L   Chloride 99 98 - 111 mmol/L   CO2 27 22 - 32 mmol/L   Glucose, Bld 344 (H) 70 - 99 mg/dL   BUN 30 (H) 8 - 23 mg/dL   Creatinine, Ser 1.32 (H) 0.44 - 1.00 mg/dL   Calcium 9.2 8.9 - 10.3 mg/dL   GFR calc non Af Amer 39 (L) >60 mL/min   GFR calc Af Amer 45 (L) >60 mL/min   Anion gap 9 5 - 15    Comment: Performed at [facility], [address] [place], [place] [address]   Ct Head Wo Contrast  Result Date: 05/06/2018 CLINICAL DATA:  Left-sided weakness for 2 days. Multiple falls. EXAM: CT HEAD WITHOUT CONTRAST CT CERVICAL SPINE WITHOUT CONTRAST TECHNIQUE: Multidetector CT imaging of the head and cervical spine was performed following the standard protocol without intravenous contrast. Multiplanar CT image reconstructions of the cervical spine were also generated. COMPARISON:  Head CT 03/30/2017 and MRI  11/20/2015. Cervical spine MRI 09/11/2013. FINDINGS: CT HEAD FINDINGS Brain: There is no evidence of acute infarct, intracranial hemorrhage, mass, midline shift, or extra-axial fluid collection. Mild ventriculomegaly has minimally progressed from the prior CT and is favored to reflect central predominant cerebral atrophy with communicating hydrocephalus less likely. Patchy cerebral white matter hypodensities may have also slightly progressed and are nonspecific but compatible with mild-to-moderate chronic small vessel ischemic disease. Vascular:  Calcified atherosclerosis at the skull base. No hyperdense vessel. Skull: No fracture or suspicious osseous lesion. Sinuses/Orbits: Small volume sphenoid sinus fluid, less than on the prior CT. Clear mastoid air cells. Left cataract extraction. Other: None. CT CERVICAL SPINE FINDINGS Alignment: Chronic cervical spine straightening. Trace fused retrolisthesis of C5 on C6. Skull base and vertebrae: C5-6 ACDF with solid interbody arthrodesis as well as right facet ankylosis. No acute fracture or suspicious osseous lesion. Soft tissues and spinal canal: No prevertebral fluid or swelling. No visible canal hematoma. Disc levels: Mild cervical spondylosis. No osseous spinal canal or neural foraminal stenosis. Upper chest: Centrilobular emphysema. Other: Asymmetric enlargement of the right thyroid lobe with multiple small nodules posteriorly. Left carotid endarterectomy. Mild right carotid artery calcification with retropharyngeal course of the proximal right ICA. IMPRESSION: 1. No evidence of acute intracranial abnormality. 2. Mild-to-moderate chronic small vessel ischemic disease and cerebral atrophy. 3. No evidence of acute fracture or traumatic subluxation in the cervical spine. Electronically Signed   By: Logan Bores M.D.   On: 05/06/2018 12:28   Ct Cervical Spine Wo Contrast  Result Date: 05/06/2018 CLINICAL DATA:  Left-sided weakness for 2 days. Multiple falls. EXAM: CT  HEAD WITHOUT CONTRAST CT CERVICAL SPINE WITHOUT CONTRAST TECHNIQUE: Multidetector CT imaging of the head and cervical spine was performed following the standard protocol without intravenous contrast. Multiplanar CT image reconstructions of the cervical spine were also generated. COMPARISON:  Head CT 03/30/2017 and MRI 11/20/2015. Cervical spine MRI 09/11/2013. FINDINGS: CT HEAD FINDINGS Brain: There is no evidence of acute infarct, intracranial hemorrhage, mass, midline shift, or extra-axial fluid collection. Mild ventriculomegaly has minimally progressed from the prior CT and is favored to reflect central predominant cerebral atrophy with communicating hydrocephalus less likely. Patchy cerebral white matter hypodensities may have also slightly progressed and are nonspecific but compatible with mild-to-moderate chronic small vessel ischemic disease. Vascular: Calcified atherosclerosis at the skull base. No hyperdense vessel. Skull: No fracture or suspicious osseous lesion. Sinuses/Orbits: Small volume sphenoid sinus fluid, less than on the prior CT. Clear mastoid air cells. Left cataract extraction. Other: None. CT CERVICAL SPINE FINDINGS Alignment: Chronic cervical spine straightening. Trace fused retrolisthesis of C5 on C6. Skull base and vertebrae: C5-6 ACDF with solid interbody arthrodesis as well as right facet ankylosis. No acute fracture or suspicious osseous lesion. Soft tissues and spinal canal: No prevertebral fluid or swelling. No visible canal hematoma. Disc levels: Mild cervical spondylosis. No osseous spinal canal or neural foraminal stenosis. Upper chest: Centrilobular emphysema. Other: Asymmetric enlargement of the right thyroid lobe with multiple small nodules posteriorly. Left carotid endarterectomy. Mild right carotid artery calcification with retropharyngeal course of the proximal right ICA. IMPRESSION: 1. No evidence of acute intracranial abnormality. 2. Mild-to-moderate chronic small vessel  ischemic disease and cerebral atrophy. 3. No evidence of acute fracture or traumatic subluxation in the cervical spine. Electronically Signed   By: Logan Bores M.D.   On: 05/06/2018 12:28   Mr Brain Wo Contrast  Result Date: 05/06/2018 CLINICAL DATA:  LEFT-sided weakness began 2 days ago. EXAM: MRI HEAD WITHOUT CONTRAST TECHNIQUE: Multiplanar, multiecho pulse sequences of the brain and surrounding structures were obtained without intravenous contrast. COMPARISON:  CT head 05/06/2018.  MR head 11/20/2015. FINDINGS: Brain: 5 mm focus of restricted diffusion affects the RIGHT medial dorsal thalamus, corresponding low ADC, consistent with acute infarction. No other areas of acute stroke are identified. No hemorrhage, mass lesion,  or extra-axial fluid. Premature for age cerebral and cerebellar atrophy. T2 and FLAIR  hyperintensities throughout the periventricular and subcortical white matter consistent with small vessel disease. Facilitated diffusion involves a LEFT parietal periventricular area of gliosis. Ventriculomegaly out of proportion to the degree of cortical atrophy, with continuous periventricular T2 high signal, raises question normal pressure hydrocephalus. Vascular: Flow voids are maintained. Tiny focus of chronic hemorrhage, LEFT posterior temporal subcortical white matter. Skull and upper cervical spine: Unremarkable visualized calvarium, skullbase, and cervical vertebrae. Pituitary, pineal, cerebellar tonsils unremarkable. No upper cervical cord lesions. Sinuses/Orbits: Insert sinuses.  LEFT cataract extraction. Other: No mastoid fluid. IMPRESSION: Acute RIGHT thalamic infarct.  No acute hemorrhage. Atrophy and small vessel disease. Progressive ventriculomegaly since 2017 could represent a component of normal pressure hydrocephalus. Electronically Signed   By: Staci Righter M.D.   On: 05/06/2018 12:53    Pending Labs [employer] (From admission, onward)    Start     Ordered   Signed and  Occupational hygienist morning,   R     Signed and Held   Signed and Held  CBC  Tomorrow morning,   R     Signed and Held   Signed and Held  Hemoglobin A1c  Once,   R     Signed and Held   Signed and Held  TSH  Once,   R     Signed and Held   Signed and Held  Hemoglobin A1c  Tomorrow morning,   R     Signed and Held   Signed and Held  Lipid panel  Tomorrow morning,   R    Comments:  Fasting    Signed and Held          Vitals/Pain Today's Vitals   05/06/18 1132 05/06/18 1138 05/06/18 1339 05/06/18 1343  BP: 127/80  (!) 162/53   Pulse: 62   63  Resp: 16   15  Temp:      TempSrc:      SpO2: 94%   97%  Weight:      Height:      PainSc:  0-No pain      Isolation Precautions No active isolations  Medications Medications  aspirin chewable tablet 324 mg (324 mg Oral Given 05/06/18 1354)    Mobility High fall risk

## 2018-05-07 ENCOUNTER — Inpatient Hospital Stay (HOSPITAL_COMMUNITY)
Admit: 2018-05-07 | Discharge: 2018-05-07 | Disposition: A | Discharge status: Home / Self Care | Payer: Medicare Other | Attending: Internal Medicine | Admitting: Internal Medicine

## 2018-05-07 DIAGNOSIS — I361 Nonrheumatic tricuspid (valve) insufficiency: Secondary | ICD-10-CM

## 2018-05-07 DIAGNOSIS — I340 Nonrheumatic mitral (valve) insufficiency: Secondary | ICD-10-CM

## 2018-05-07 LAB — HEMOGLOBIN A1C
Hgb A1c MFr Bld: 7.5 % — ABNORMAL HIGH (ref 4.8–5.6)
Mean Plasma Glucose: 168.55 mg/dL

## 2018-05-07 LAB — BASIC METABOLIC PANEL
Anion gap: 7 (ref 5–15)
BUN: 29 mg/dL — ABNORMAL HIGH (ref 8–23)
CALCIUM: 8.6 mg/dL — AB (ref 8.9–10.3)
CO2: 31 mmol/L (ref 22–32)
Chloride: 102 mmol/L (ref 98–111)
Creatinine, Ser: 1.22 mg/dL — ABNORMAL HIGH (ref 0.44–1.00)
GFR calc Af Amer: 50 mL/min — ABNORMAL LOW (ref >60)
GFR calc non Af Amer: 43 mL/min — ABNORMAL LOW (ref >60)
Glucose, Bld: 166 mg/dL — ABNORMAL HIGH (ref 70–99)
Potassium: 4.3 mmol/L (ref 3.5–5.1)
SODIUM: 140 mmol/L (ref 135–145)

## 2018-05-07 LAB — ECHOCARDIOGRAM COMPLETE
HEIGHTINCHES: 65 in
Weight: 2384 oz

## 2018-05-07 LAB — LIPID PANEL
Cholesterol: 164 mg/dL (ref 0–200)
HDL: 48 mg/dL (ref >40)
LDL Cholesterol: 94 mg/dL (ref 0–99)
Total CHOL/HDL Ratio: 3.4 RATIO
Triglycerides: 109 mg/dL (ref <150)
VLDL: 22 mg/dL (ref 0–40)

## 2018-05-07 LAB — CBC
HCT: 34.5 % — ABNORMAL LOW (ref 36.0–46.0)
Hemoglobin: 11.3 g/dL — ABNORMAL LOW (ref 12.0–15.0)
MCH: 31.2 pg (ref 26.0–34.0)
MCHC: 32.8 g/dL (ref 30.0–36.0)
MCV: 95.3 fL (ref 80.0–100.0)
PLATELETS: 184 10*3/uL (ref 150–400)
RBC: 3.62 MIL/uL — ABNORMAL LOW (ref 3.87–5.11)
RDW: 13.3 % (ref 11.5–15.5)
WBC: 5.9 10*3/uL (ref 4.0–10.5)
nRBC: 0 % (ref 0.0–0.2)

## 2018-05-07 LAB — GLUCOSE, CAPILLARY
GLUCOSE-CAPILLARY: 178 mg/dL — AB (ref 70–99)
Glucose-Capillary: 261 mg/dL — ABNORMAL HIGH (ref 70–99)

## 2018-05-07 MED ORDER — ASPIRIN 81 MG PO TBEC: 81.0000 mg | DELAYED_RELEASE_TABLET | Freq: Every day | ORAL | 0 refills | Status: DC

## 2018-05-07 MED ORDER — CLOPIDOGREL BISULFATE 75 MG PO TABS: 75.0000 mg | ORAL_TABLET | Freq: Every day | ORAL | 0 refills | Status: DC

## 2018-05-07 MED ORDER — CLOPIDOGREL BISULFATE 75 MG PO TABS: 75.0000 mg | ORAL_TABLET | Freq: Every day | ORAL | Status: DC

## 2018-05-07 MED ORDER — ASPIRIN EC 81 MG PO TBEC: 81.0000 mg | DELAYED_RELEASE_TABLET | Freq: Every day | ORAL | Status: DC

## 2018-05-07 MED ORDER — ATORVASTATIN CALCIUM 40 MG PO TABS: 40.0000 mg | ORAL_TABLET | Freq: Every day | ORAL | 0 refills | Status: DC

## 2018-05-07 NOTE — Progress Notes (Signed)
Subjective: Patient has had no new neurological complaints.  On ASA.  Objective: Current vital signs: BP (!) 158/63 (BP Location: Right Arm)   Pulse 64   Temp 97.9 F (36.6 C) (Oral)   Resp 16   Ht 5\' 5"  (1.651 m)   Wt 67.6 kg   SpO2 93%   BMI 24.79 kg/m  Vital signs in last 24 hours: Temp:  [97.5 F (36.4 C)-98 F (36.7 C)] 97.9 F (36.6 C) (02/01 0815) Pulse Rate:  [58-64] 64 (02/01 0815) Resp:  [15-20] 16 (02/01 0815) BP: (122-167)/(53-80) 158/63 (02/01 0815) SpO2:  [91 %-97 %] 93 % (02/01 0815) Weight:  [67.6 kg] 67.6 kg (01/31 1045)  Intake/Output from previous day: 01/31 0701 - 02/01 0700 In: 816.2 [P.O.:118; I.V.:698.2] Out: 1100 [Urine:1100] Intake/Output this shift: Total I/O In: -  Out: 200 [Urine:200] Nutritional status:  Diet Order            Diet heart healthy/carb modified Room service appropriate? Yes; Fluid consistency: Thin  Diet effective now              Neurologic Exam: Mental Status: Alert, oriented, thought content appropriate. Speech fluent without evidence of aphasia. Able to follow 3 step commands without difficulty. Attention span and concentration seemed appropriate  Cranial Nerves: II: Discs flat bilaterally; Visual fields grossly normal, pupils equal, round, reactive to light and accommodation III,IV, VI: ptosis not present, extra-ocular motions intact bilaterally V,VII: smile symmetric, facial light touch sensationintact VIII: hearing normal bilaterally IX,X: gag reflex present XI: bilateral shoulder shrug XII: midline tongue extension Motor: Right :Upper extremity 5/5Without pronator driftLeft: Upper extremity 5-/5 without pronator drift Right:Lower extremity 5/5Left: Lower extremity 4+/5 Tone and bulk:normal tone throughout; no atrophy noted Sensory: Pinprick and light touch decreased on the left   Lab Results: Basic Metabolic Panel: Recent Labs  Lab  05/06/18 1058 05/07/18 0355  NA 135 140  K 4.3 4.3  CL 99 102  CO2 27 31  GLUCOSE 344* 166*  BUN 30* 29*  CREATININE 1.32* 1.22*  CALCIUM 9.2 8.6*    Liver Function Tests: No results for input(s): AST, ALT, ALKPHOS, BILITOT, PROT, ALBUMIN in the last 168 hours. No results for input(s): LIPASE, AMYLASE in the last 168 hours. No results for input(s): AMMONIA in the last 168 hours.  CBC: Recent Labs  Lab 05/06/18 1058 05/07/18 0355  WBC 7.4 5.9  NEUTROABS 5.9  --   HGB 11.6* 11.3*  HCT 35.3* 34.5*  MCV 95.9 95.3  PLT 203 184    Cardiac Enzymes: No results for input(s): CKTOTAL, CKMB, CKMBINDEX, TROPONINI in the last 168 hours.  Lipid Panel: Recent Labs  Lab 05/07/18 0355  CHOL 164  TRIG 109  HDL 48  CHOLHDL 3.4  VLDL 22  LDLCALC 94    CBG: Recent Labs  Lab 05/06/18 1544 05/06/18 2116 05/07/18 0756  GLUCAP 207* 254* 178*    Microbiology: Results for orders placed or performed during the hospital encounter of 03/30/17  Culture, blood (routine x 2)     Status: None   Collection Time: 03/30/17  1:43 PM  Result Value Ref Range Status   Specimen Description BLOOD BLOOD RIGHT FOREARM  Final   Special Requests   Final    BOTTLES DRAWN AEROBIC AND ANAEROBIC Blood Culture adequate volume   Culture   Final    NO GROWTH 5 DAYS Performed at [facility], [address], [place], [place] [address]    Report Status 04/04/2017 FINAL  Final  Culture, blood (routine x 2)     Status: None   Collection Time: 03/30/17  1:43 PM  Result Value Ref Range Status   Specimen Description BLOOD RIGHT ANTECUBITAL  Final   Special Requests   Final    BOTTLES DRAWN AEROBIC AND ANAEROBIC RIGHT ANTECUBITAL   Culture   Final    NO GROWTH 5 DAYS Performed at [facility], [address] [place], [place] [address]    Report Status 04/04/2017 FINAL  Final  Urine culture     Status: Abnormal   Collection Time: 04/01/17  2:00 PM  Result Value Ref Range Status    Specimen Description   Final    URINE, RANDOM Performed at [facility], [address], [place], [place] [address]    Special Requests   Final    NONE Performed at [facility], [address] [place], [place] [address]    Culture MULTIPLE SPECIES PRESENT, SUGGEST RECOLLECTION (A)  Final   Report Status 04/03/2017 FINAL  Final    Coagulation Studies: No results for input(s): LABPROT, INR in the last 72 hours.  Imaging: Ct Head Wo Contrast  Result Date: 05/06/2018 CLINICAL DATA:  Left-sided weakness for 2 days. Multiple falls. EXAM: CT HEAD WITHOUT CONTRAST CT CERVICAL SPINE WITHOUT CONTRAST TECHNIQUE: Multidetector CT imaging of the head and cervical spine was performed following the standard protocol without intravenous contrast. Multiplanar CT image reconstructions of the cervical spine were also generated. COMPARISON:  Head CT 03/30/2017 and MRI 11/20/2015. Cervical spine MRI 09/11/2013. FINDINGS: CT HEAD FINDINGS Brain: There is no evidence of acute infarct, intracranial hemorrhage, mass, midline shift, or extra-axial fluid collection. Mild ventriculomegaly has minimally progressed from the prior CT and is favored to reflect central predominant cerebral atrophy with communicating hydrocephalus less likely. Patchy cerebral white matter hypodensities may have also slightly progressed and are nonspecific but compatible with mild-to-moderate chronic small vessel ischemic disease. Vascular: Calcified atherosclerosis at the skull base. No hyperdense vessel. Skull: No fracture or suspicious osseous lesion. Sinuses/Orbits: Small volume sphenoid sinus fluid, less than on the prior CT. Clear mastoid air cells. Left cataract extraction. Other: None. CT CERVICAL SPINE FINDINGS Alignment: Chronic cervical spine straightening. Trace fused retrolisthesis of C5 on C6. Skull base and vertebrae: C5-6 ACDF with solid interbody arthrodesis as well as right facet ankylosis. No acute  fracture or suspicious osseous lesion. Soft tissues and spinal canal: No prevertebral fluid or swelling. No visible canal hematoma. Disc levels: Mild cervical spondylosis. No osseous spinal canal or neural foraminal stenosis. Upper chest: Centrilobular emphysema. Other: Asymmetric enlargement of the right thyroid lobe with multiple small nodules posteriorly. Left carotid endarterectomy. Mild right carotid artery calcification with retropharyngeal course of the proximal right ICA. IMPRESSION: 1. No evidence of acute intracranial abnormality. 2. Mild-to-moderate chronic small vessel ischemic disease and cerebral atrophy. 3. No evidence of acute fracture or traumatic subluxation in the cervical spine. Electronically Signed   By: Logan Bores M.D.   On: 05/06/2018 12:28   Ct Cervical Spine Wo Contrast  Result Date: 05/06/2018 CLINICAL DATA:  Left-sided weakness for 2 days. Multiple falls. EXAM: CT HEAD WITHOUT CONTRAST CT CERVICAL SPINE WITHOUT CONTRAST TECHNIQUE: Multidetector CT imaging of the head and cervical spine was performed following the standard protocol without intravenous contrast. Multiplanar CT image reconstructions of the cervical spine were also generated. COMPARISON:  Head CT 03/30/2017 and MRI 11/20/2015. Cervical spine MRI 09/11/2013. FINDINGS: CT HEAD FINDINGS Brain: There is no evidence of acute infarct, intracranial  hemorrhage, mass, midline shift, or extra-axial fluid collection. Mild ventriculomegaly has minimally progressed from the prior CT and is favored to reflect central predominant cerebral atrophy with communicating hydrocephalus less likely. Patchy cerebral white matter hypodensities may have also slightly progressed and are nonspecific but compatible with mild-to-moderate chronic small vessel ischemic disease. Vascular: Calcified atherosclerosis at the skull base. No hyperdense vessel. Skull: No fracture or suspicious osseous lesion. Sinuses/Orbits: Small volume sphenoid sinus fluid,  less than on the prior CT. Clear mastoid air cells. Left cataract extraction. Other: None. CT CERVICAL SPINE FINDINGS Alignment: Chronic cervical spine straightening. Trace fused retrolisthesis of C5 on C6. Skull base and vertebrae: C5-6 ACDF with solid interbody arthrodesis as well as right facet ankylosis. No acute fracture or suspicious osseous lesion. Soft tissues and spinal canal: No prevertebral fluid or swelling. No visible canal hematoma. Disc levels: Mild cervical spondylosis. No osseous spinal canal or neural foraminal stenosis. Upper chest: Centrilobular emphysema. Other: Asymmetric enlargement of the right thyroid lobe with multiple small nodules posteriorly. Left carotid endarterectomy. Mild right carotid artery calcification with retropharyngeal course of the proximal right ICA. IMPRESSION: 1. No evidence of acute intracranial abnormality. 2. Mild-to-moderate chronic small vessel ischemic disease and cerebral atrophy. 3. No evidence of acute fracture or traumatic subluxation in the cervical spine. Electronically Signed   By: Logan Bores M.D.   On: 05/06/2018 12:28   Mr Jodene Nam Head Wo Contrast  Result Date: 05/06/2018 CLINICAL DATA:  Acute RIGHT thalamic infarct. Continued surveillance. EXAM: MRA HEAD WITHOUT CONTRAST TECHNIQUE: Angiographic images of the Circle of Willis were obtained using MRA technique without intravenous contrast. COMPARISON:  MRA intracranial 08/30/2014.  MRI brain 05/06/2018. FINDINGS: Internal carotid arteries are patent throughout their cervical, petrous, cavernous, and supraclinoid segment. Mild irregularity at the cervical petrous junction on the RIGHT. Mild irregularity at the RIGHT ICA terminus. Mild irregularity of the BILATERAL M1 MCA segments. High-grade stenosis of the RIGHT M2 superior and into divisions proximally. Nonstenotic irregularity of the LEFT M2 branches. Nonstenotic irregularity of the A1 anterior cerebral arteries. Moderate irregularity of the A2 and A3  anterior cerebral arteries bilaterally. Patent anterior communicating artery. Fetal origin LEFT PCA. Moderate stenosis at its origin. Basilar artery widely patent with only mild irregularity. Both vertebrals contribute to formation of the basilar with mild irregularity of the proximal LEFT V4 segment and mild to moderate irregularity of the proximal to mid RIGHT V4 segment. Mild irregularity of RIGHT P1 PCA. No saccular aneurysm.  No cerebellar branch occlusion. IMPRESSION: Stable widespread intracranial atherosclerotic change. No proximal large vessel flow reducing lesion is evident. The observed acute RIGHT thalamic subcentimeter infarct likely represents small vessel ischemic change. Overall stability from priors. Electronically Signed   By: Staci Righter M.D.   On: 05/06/2018 16:31   Mr Brain Wo Contrast  Result Date: 05/06/2018 CLINICAL DATA:  LEFT-sided weakness began 2 days ago. EXAM: MRI HEAD WITHOUT CONTRAST TECHNIQUE: Multiplanar, multiecho pulse sequences of the brain and surrounding structures were obtained without intravenous contrast. COMPARISON:  CT head 05/06/2018.  MR head 11/20/2015. FINDINGS: Brain: 5 mm focus of restricted diffusion affects the RIGHT medial dorsal thalamus, corresponding low ADC, consistent with acute infarction. No other areas of acute stroke are identified. No hemorrhage, mass lesion,  or extra-axial fluid. Premature for age cerebral and cerebellar atrophy. T2 and FLAIR hyperintensities throughout the periventricular and subcortical white matter consistent with small vessel disease. Facilitated diffusion involves a LEFT parietal periventricular area of gliosis. Ventriculomegaly out of proportion to the degree  of cortical atrophy, with continuous periventricular T2 high signal, raises question normal pressure hydrocephalus. Vascular: Flow voids are maintained. Tiny focus of chronic hemorrhage, LEFT posterior temporal subcortical white matter. Skull and upper cervical spine:  Unremarkable visualized calvarium, skullbase, and cervical vertebrae. Pituitary, pineal, cerebellar tonsils unremarkable. No upper cervical cord lesions. Sinuses/Orbits: Insert sinuses.  LEFT cataract extraction. Other: No mastoid fluid. IMPRESSION: Acute RIGHT thalamic infarct.  No acute hemorrhage. Atrophy and small vessel disease. Progressive ventriculomegaly since 2017 could represent a component of normal pressure hydrocephalus. Electronically Signed   By: Staci Righter M.D.   On: 05/06/2018 12:53   US Carotid Bilateral  Result Date: 05/06/2018 CLINICAL DATA:  Stroke EXAM: BILATERAL CAROTID DUPLEX ULTRASOUND TECHNIQUE: Pearline Cables scale imaging, color Doppler and duplex ultrasound were performed of bilateral carotid and vertebral arteries in the neck. COMPARISON:  08/30/2014 FINDINGS: Criteria: Quantification of carotid stenosis is based on velocity parameters that correlate the residual internal carotid diameter with NASCET-based stenosis levels, using the diameter of the distal internal carotid lumen as the denominator for stenosis measurement. The following velocity measurements were obtained: RIGHT ICA: 81/20 cm/sec CCA: 54/00 cm/sec SYSTOLIC ICA/CCA RATIO:  1.2 ECA: 95 cm/sec LEFT ICA: 82/23 cm/sec CCA: 86/76 cm/sec SYSTOLIC ICA/CCA RATIO:  1.3 ECA: 106 cm/sec RIGHT CAROTID ARTERY: Intimal thickening in the common carotid artery. Mild eccentric plaque in the bulb and ICA origin. No high-grade stenosis. Normal waveforms and color Doppler signal. RIGHT VERTEBRAL ARTERY:  Normal flow direction and waveform. LEFT CAROTID ARTERY: Smooth nonocclusive plaque in the common carotid artery. Eccentric hypoechoic plaque in the bulb and proximal ICA resulting in at least mild stenosis. Normal waveforms and color Doppler signal however. LEFT VERTEBRAL ARTERY:  Normal flow direction and waveform. IMPRESSION: 1. Bilateral carotid bifurcation and proximal ICA plaque, resulting in less than 50% diameter ICA stenosis. 2.   Antegrade bilateral vertebral arterial flow. Electronically Signed   By: Lucrezia Europe M.D.   On: 05/06/2018 15:17    Medications:  I have reviewed the patient's current medications. Scheduled: . aspirin  300 mg Rectal Daily   Or  . aspirin  325 mg Oral Daily  . atorvastatin  40 mg Oral q1800  . enoxaparin (LOVENOX) injection  40 mg Subcutaneous Q24H  . fluticasone  2 spray Each Nare Daily  . gabapentin  300 mg Oral BID  . glimepiride  2 mg Oral BID  . insulin aspart  0-5 Units Subcutaneous QHS  . insulin aspart  0-9 Units Subcutaneous TID WC  . isosorbide mononitrate  30 mg Oral Daily  . magnesium oxide  400 mg Oral BID  . metoprolol tartrate  25 mg Oral BID  . mometasone-formoterol  2 puff Inhalation BID  . pantoprazole  40 mg Oral Daily    Assessment/Plan: Patient with acute right thalamic infarct.  Left hemiparesis on neurological examination.  MRA shows widespread intracranial atherosclerosis  With out evidence of significant large vessel disease.  No new neurological complaints.  Carotid dopplers show bilateral carotid plaques with no evidence of hemodynamically significant stenosis.  Echocardiogram pending.  A1c 7.5, LDL 88.  Recommendations: 1. Aggressive lipid management with target LDL<70. 2. Patient to be on ASA 81mg  amd Plavix 75mg  daily.  Will follow up with neurology on an outpatient basis for timing to return to antiplatelet monotherapy.   3. Echocardiogram pending 4. Blood sugar management with target A1c<7.0. 5. Target SBP<140. 5. Continue therapy.    LOS: 1 day   Alexis Goodell, MD Neurology [phone] 05/07/2018  10:24 AM

## 2018-05-07 NOTE — Discharge Instructions (Signed)
It was so nice to meet you during this hospitalization!  You had a stroke. You were seen by the neurologist, who recommended that you take Aspirin 81mg  and Plavix 75mg  every day. We have increased your Lipitor dose from 20mg  daily to 40mg  daily. These medicines will help prevent future strokes. Please make sure you pick up these prescriptions from your pharmacy.  Please follow-up with your primary care doctor and call [facility] Neurology to schedule a new patient appointment.  Take care, Dr. Brett Albino

## 2018-05-07 NOTE — Evaluation (Signed)
Occupational Therapy Evaluation Patient Details Name: [name] [name] MRN: [id number] DOB: 11/04/1940 Today's Date: 05/07/2018    History of Present Illness Pt. is a 78 y.o. female with multiple medical issues most pertinent with history of coronary artery disease, diabetes mellitus, hyperlipidemia, hypertension, carotid artery stenosis status post endarterectomy, and renal impairment presenting to the ED with complaints of left sided weakness and numbness x 3 days. MRI showed acute right thalamic infarct.    Clinical Impression   Pt. presents with weakness, limited activity tolerance, and limited functional mobility which limits her ability to complete basic ADL and IADL functioning. Pt. Resides at home with her husband. Pt. daughter is supportive. Pt. was independent with most ADLs requiring assist from with LE dressing. Pt. required assist with meal preparation, and medication management. Pt. Requires consistent cuing her hand placement for safety with sit to stand. Pt. could benefit from OT services for ADL training, A/E training, there. Ex.,and pt/family education about home modification, and DME. Pt. Plans to return home upon discharge with family to assist pt. as needed. Pt. Could benefit from follow-up [place] services upon discharge.    Follow Up Recommendations  Home health OT    Equipment Recommendations       Recommendations for Other Services       Precautions / Restrictions Precautions Precautions: Fall Restrictions Weight Bearing Restrictions: No      Mobility Bed Mobility   Pt. Up in recliner chair             Transfers Overall transfer level: Needs assistance Equipment used: Rolling walker (2 wheeled) Transfers: Sit to/from Stand Sit to Stand: Min guard         General transfer comment: verbal cues for handplacement for proper/safe technique    Balance Overall balance assessment: Needs assistance Sitting-balance support: Feet supported Sitting  balance-Leahy Scale: Good       Standing balance-Leahy Scale: Fair                             ADL either performed or assessed with clinical judgement   ADL Overall ADL's : Needs assistance/impaired Eating/Feeding: Independent;Set up   Grooming: Minimal assistance;Set up   Upper Body Bathing: Set up;Independent   Lower Body Bathing: Minimal assistance;Set up   Upper Body Dressing : Set up;Independent   Lower Body Dressing: Minimal assistance;Set up   Toilet Transfer: Min guard;Set up;RW   Toileting- Clothing Manipulation and Hygiene: Set up;Min guard       Functional mobility during ADLs: Min guard       Vision Baseline Vision/History: Wears glasses;Macular Degeneration(Right eye blindess) Wears Glasses: Reading only Patient Visual Report: No change from baseline       Perception     Praxis      Pertinent Vitals/Pain Pain Assessment: No/denies pain Faces Pain Scale: No hurt Pain Location: Pt mentioned low back pain but does not exhibit pain signs throughout session     Hand Dominance Right   Extremity/Trunk Assessment Upper Extremity Assessment Upper Extremity Assessment: Generalized weakness RUE Deficits / Details: grossly 4-/5 RUE Sensation: WNL RUE Coordination: WNL LUE Deficits / Details: grossly 4-/5 LUE Sensation: WNL(Intact light touch, and prorioceptive awareness.) LUE Coordination: WNL   Cervical / Trunk Assessment Cervical / Trunk Assessment: Kyphotic   Communication Communication Communication: No difficulties   Cognition Arousal/Alertness: Awake/alert Behavior During Therapy: WFL for tasks assessed/performed Overall Cognitive Status: Within Functional Limits for tasks assessed  General Comments       Exercises     Shoulder Instructions      Home Living Family/patient expects to be discharged to:: Private residence Living Arrangements: Spouse/significant  other Available Help at Discharge: Family Type of Home: Mobile home Home Access: Stairs to enter Entrance Stairs-Number of Steps: 6 Entrance Stairs-Rails: Right;Left;Can reach both Home Layout: One level     Bathroom Shower/Tub: Tub/shower unit;Door   [employer] Toilet: Associate Professor Accessibility: Yes   Home Equipment: Hospital bed;Walker - 2 wheels;Wheelchair - [employer];Shower seat          Prior Functioning/Environment Level of Independence: Needs assistance  Gait / Transfers Assistance Needed: Supervision for gait with SPC ADL's / Homemaking Assistance Needed: assistance with LE ADLS, dependent for IADLs            OT Problem List: Decreased strength;Decreased range of motion;Decreased activity tolerance;Decreased knowledge of use of DME or AE;Pain;Impaired UE functional use      OT Treatment/Interventions: Self-care/ADL training;Therapeutic exercise;Neuromuscular education;Therapeutic activities;Patient/family education;DME and/or AE instruction    OT Goals(Current goals can be found in the care plan section) Acute Rehab OT Goals Patient Stated Goal: to get her strength back  OT Frequency: Min 2X/week   Barriers to D/C:            Co-evaluation              AM-PAC OT "6 Clicks" Daily Activity     Outcome Measure Help from another person eating meals?: None Help from another person taking care of personal grooming?: A Little Help from another person toileting, which includes using toliet, bedpan, or urinal?: A Little Help from another person bathing (including washing, rinsing, drying)?: A Little Help from another person to put on and taking off regular upper body clothing?: None Help from another person to put on and taking off regular lower body clothing?: A Little 6 Click Score: 20   End of Session Equipment Utilized During Treatment: Gait belt  Activity Tolerance: Patient tolerated treatment well Patient left: in chair;with call  bell/phone within reach;with chair alarm set;with family/visitor present  OT Visit Diagnosis: Muscle weakness (generalized) (M62.81)                Time: 2482-5003 OT Time Calculation (min): 40 min Charges:  OT General Charges $OT Visit: 1 Visit OT Evaluation $OT Eval High Complexity: 1 High  Harrel Carina, MS, OTR/L   Harrel Carina 05/07/2018, 12:04 PM

## 2018-05-07 NOTE — Evaluation (Signed)
Physical Therapy Evaluation Patient Details Name: [name] [name] MRN: [id number] DOB: [birth year] Today's Date: 05/07/2018   History of Present Illness  78 y.o. female with multiple medical issues most pertinent with history of coronary artery disease, diabetes mellitus, hyperlipidemia, hypertension, carotid artery stenosis status post endarterectomy, and renal impairment presenting to the ED with complaints of left sided weakness and numbness x 3 days. MRI showed acute right thalamic infarct.     Clinical Impression  The patient was alert, in bed, behavior WFLs, able to answer all questions appropriately, A&Ox4. Patient reported that she lives in single story home with husband and has a step daughter who assists every day as needed. Prior to admission pt needed supervision with ambulation with SPC, supervision/little physical assist for ADLs, and dependent for IADLs.   Upon assessment patient demonstrated deficits in LUE hand sensation, LLE sensation and strength, decreased activity tolerance and endurance compared to baseline. No pronator drift noted, UE coordination intact, mild coordination deficit of LLE noted. Pt was able to mobilize to EOB with supervision and sit EOB for several minutes with GOOD balance. Transfers performed with CGA and RW. Pt needed verbal cueing to improve LLE hip flexion and DF for safe ambulation, pt exhibited slow, shuffling steps, and fatigued quickly needed several standing rest breaks in ~41ft.  Overall the patient demonstrated deficits that differ from PLOF and that impede the patient's functional abilities, safety, and mobility. The patient would benefit from skilled PT intervention to address these. Current recommendation is HHPT with supervision for mobility/OOB.       Follow Up Recommendations Home health PT;Supervision for mobility/OOB    Equipment Recommendations  None recommended by PT;Other (comment)(Pt has all DME necessary at home)    Recommendations  for Other Services       Precautions / Restrictions Precautions Precautions: Fall Restrictions Weight Bearing Restrictions: No      Mobility  Bed Mobility Overal bed mobility: Modified Independent             General bed mobility comments: use of bedrails  Transfers Overall transfer level: Needs assistance Equipment used: Rolling walker (2 wheeled) Transfers: Sit to/from Stand Sit to Stand: Min guard         General transfer comment: verbal cues for handplacement for proper/safe technique  Ambulation/Gait Ambulation/Gait assistance: Min guard Gait Distance (Feet): 65 Feet Assistive device: Rolling walker (2 wheeled)   Gait velocity: decreased   General Gait Details: Shuffling step, tends to utilize RW outside BOS, fatigued very quickly, multiple standing rest breaks needed.  Stairs            Wheelchair Mobility    Modified Rankin (Stroke Patients Only)       Balance Overall balance assessment: Needs assistance Sitting-balance support: Feet supported Sitting balance-Leahy Scale: Good       Standing balance-Leahy Scale: Fair                               Pertinent Vitals/Pain Pain Assessment: Faces Faces Pain Scale: No hurt Pain Location: Pt mentioned low back pain but does not exhibit pain signs throughout session    [place] expects to be discharged to:: Private residence Living Arrangements: Spouse/significant other Available Help at Discharge: Family Type of Home: Mobile home Home Access: Stairs to enter Entrance Stairs-Rails: Right;Left;Can reach both Entrance Stairs-Number of Steps: 6 Home Layout: One level Home Equipment: Hospital bed;Walker - 2 wheels;Wheelchair - [employer];Shower seat  Prior Function Level of Independence: Needs assistance   Gait / Transfers Assistance Needed: Supervision for gait with SPC  ADL's / Homemaking Assistance Needed: assistance with ADLS  (supervision/physical assist), dependent for IADLs        Hand Dominance   Dominant Hand: Right    Extremity/Trunk Assessment   Upper Extremity Assessment Upper Extremity Assessment: Generalized weakness;RUE deficits/detail;LUE deficits/detail RUE Deficits / Details: grossly 4-/5 RUE Sensation: WNL RUE Coordination: WNL LUE Deficits / Details: grossly 4-/5 LUE Sensation: decreased light touch(Pt complained of L hand sensation deficit) LUE Coordination: WNL    Lower Extremity Assessment Lower Extremity Assessment: RLE deficits/detail;LLE deficits/detail RLE Deficits / Details: grossly 4-/5 RLE Sensation: WNL RLE Coordination: WNL LLE Deficits / Details: hip flexion 3/5, knee extension/flexion 4-/5, Ankle DF 3+/5, PF 4-/5 LLE Sensation: decreased light touch(complained of sensation abnormalities L4) LLE Coordination: decreased gross motor(mild coordination deficit noted)    Cervical / Trunk Assessment Cervical / Trunk Assessment: Kyphotic  Communication   Communication: No difficulties  Cognition Arousal/Alertness: Awake/alert Behavior During Therapy: WFL for tasks assessed/performed Overall Cognitive Status: Within Functional Limits for tasks assessed                                        General Comments      Exercises     Assessment/Plan    PT Assessment Patient needs continued PT services  PT Problem List Decreased strength;Impaired sensation;Decreased activity tolerance;Decreased knowledge of use of DME;Decreased balance;Decreased safety awareness;Decreased mobility       PT Treatment Interventions DME instruction;Therapeutic exercise;Gait training;Balance training;Stair training;Neuromuscular re-education;Functional mobility training;Therapeutic activities;Patient/family education    PT Goals (Current goals can be found in the Care Plan section)  Acute Rehab PT Goals Patient Stated Goal: to get her strength back PT Goal Formulation: With  patient Time For Goal Achievement: 05/21/18 Potential to Achieve Goals: Good    Frequency 7X/week   Barriers to discharge        Co-evaluation               AM-PAC PT "6 Clicks" Mobility  Outcome Measure Help needed turning from your back to your side while in a flat bed without using bedrails?: A Little Help needed moving from lying on your back to sitting on the side of a flat bed without using bedrails?: A Little Help needed moving to and from a bed to a chair (including a wheelchair)?: A Little Help needed standing up from a chair using your arms (e.g., wheelchair or bedside chair)?: A Little Help needed to walk in hospital room?: A Little Help needed climbing 3-5 steps with a railing? : A Lot 6 Click Score: 17    End of Session Equipment Utilized During Treatment: Gait belt Activity Tolerance: Patient limited by fatigue Patient left: in chair;with chair alarm set;with SCD's reapplied;with call bell/phone within reach;Other (comment)(speech therapist) Nurse Communication: Mobility status PT Visit Diagnosis: Other abnormalities of gait and mobility (R26.89);Difficulty in walking, not elsewhere classified (R26.2);Muscle weakness (generalized) (M62.81)    Time: 9937-1696 PT Time Calculation (min) (ACUTE ONLY): 35 min   Charges:   PT Evaluation $PT Eval Moderate Complexity: 1 Mod PT Treatments $Gait Training: 8-22 mins        Lieutenant Diego PT, DPT 10:27 AM,05/07/18 [phone]

## 2018-05-07 NOTE — Discharge Summary (Signed)
[name] [name] at [place] NAME: [name] [name]    MR#:  [id number]  DATE OF BIRTH:  1941-03-25  DATE OF ADMISSION:  05/06/2018   ADMITTING PHYSICIAN: Gladstone Lighter, MD  DATE OF DISCHARGE: 05/07/18  PRIMARY CARE PHYSICIAN: Jerrol Banana., MD   ADMISSION DIAGNOSIS:  Stroke (cerebrum) [facility]) [I63.9] Cerebrovascular accident (CVA), unspecified mechanism ([place]) [I63.9] DISCHARGE DIAGNOSIS:  Active Problems:   Stroke (cerebrum) ([place])  SECONDARY DIAGNOSIS:   Past Medical History:  Diagnosis Date  . Abnormal CT lung screening 10/17/2015  . COPD (chronic obstructive pulmonary disease) ([place])   . Coronary artery disease, non-occlusive    a. cath 2006: min nonobs CAD; b. cath 12/2010: cath LAD 50%, RCA 60%; c. 08/2013: Minimal luminal irregs, right dominant system with no significant CAD, diffuse luminal irregs noted. Normal EF 55%, no AS or MS.   . Diabetes mellitus   . Hyperlipemia    Followed by Dr. Rosanna Randy  . Hypertension   . Lung cancer ([place])   . Macular degeneration    rt  . Personal history of tobacco use, presenting hazards to health 10/15/2015  . Pneumonia    hx  . Shortness of breath   . Stroke [facility])    HOSPITAL COURSE:   [name] [name] is a 78 year old female who presented to the ED from outpatient physical therapy office with left arm weakness and numbness, as well as left facial drooping.  In the ED, MRI showed right thalamic infarct.  She was admitted for further management.  Acute right thalamic stroke -Seen by neurology, who recommended aspirin and Plavix.  Will need to follow-up with neurology as an outpatient for timing to return to antiplatelet monotherapy. -LDL was 94.  Lipitor dose increased from 20 mg daily to 40 mg daily. -Carotid Dopplers unremarkable -Echocardiogram read was pending at the time of discharge -PT and OT recommended home health services  Type 2 diabetes -A1c 7.5 this admission -Home meds were  continued  GERD -Continued Protonix  COPD-stable, no signs of acute exacerbation -Continued home inhalers   Hyperlipidemia-LDL 94 this admission -Lipitor dose increased to 40 mg daily  History of lung cancer-currently in remission -Needs to follow-up with oncology as an outpatient  DISCHARGE CONDITIONS:  Acute right thalamic stroke Type 2 diabetes GERD COPD Hyperlipidemia History of lung cancer CONSULTS OBTAINED:  Treatment Team:  Catarina Hartshorn, MD DRUG ALLERGIES:   Allergies  Allergen Reactions  . Coconut Fatty Acids Swelling and Other (See Comments)    Throat swells   DISCHARGE MEDICATIONS:   Allergies as of 05/07/2018      Reactions   Coconut Fatty Acids Swelling, Other (See Comments)   Throat swells      Medication List    STOP taking these medications   albuterol (5 MG/ML) 0.5% nebulizer solution Commonly known as:  PROVENTIL   amoxicillin-clavulanate 875-125 MG tablet Commonly known as:  AUGMENTIN   doxycycline 100 MG tablet Commonly known as:  VIBRA-TABS   HYDROcodone-acetaminophen 5-325 MG tablet Commonly known as:  NORCO   mometasone 0.1 % cream Commonly known as:  ELOCON   ofloxacin 0.3 % OTIC solution Commonly known as:  FLOXIN   ondansetron 8 MG tablet Commonly known as:  ZOFRAN   triamcinolone cream 0.1 % Commonly known as:  KENALOG     TAKE these medications   aspirin 81 MG EC tablet Take 1 tablet (81 mg total) by mouth daily. Start taking on:  May 08, 2018  atorvastatin 40 MG tablet Commonly known as:  LIPITOR Take 1 tablet (40 mg total) by mouth daily at 6 PM. What changed:   how much to take when to take this   budesonide-formoterol 160-4.5 MCG/ACT inhaler Commonly known as:  SYMBICORT Inhale 2 puffs into the lungs 2 (two) times daily.   clopidogrel 75 MG tablet Commonly known as:  PLAVIX Take 1 tablet (75 mg total) by mouth daily. Start taking on:  May 08, 2018   fluticasone 50 MCG/ACT nasal  spray Commonly known as:  FLONASE Place 2 sprays into both nostrils daily.   gabapentin 600 MG tablet Commonly known as:  NEURONTIN TAKE ONE-HALF TABLET BY  MOUTH TWO TIMES DAILY What changed:  See the new instructions.   glimepiride 2 MG tablet Commonly known as:  AMARYL Take 1 tablet (2 mg total) by mouth 2 (two) times daily.   ICAPS Caps Take 1 capsule by mouth 2 (two) times daily.   isosorbide mononitrate 30 MG 24 hr tablet Commonly known as:  IMDUR TAKE 1 TABLET BY MOUTH  DAILY   LORazepam 1 MG tablet Commonly known as:  ATIVAN Take 1 tablet (1 mg total) by mouth 2 (two) times daily as needed for anxiety.   magnesium oxide 400 (241.3 Mg) MG tablet Commonly known as:  MAG-OX Take 400 mg by mouth 2 (two) times daily.   metoprolol tartrate 25 MG tablet Commonly known as:  LOPRESSOR TAKE 1 TABLET BY MOUTH TWO  TIMES DAILY   nitroGLYCERIN 0.4 MG SL tablet Commonly known as:  NITROSTAT Place 1 tablet (0.4 mg total) under the tongue every 5 (five) minutes as needed for chest pain.   ONE TOUCH ULTRA 2 w/Device Kit 1 each by Does not apply route See admin instructions.   ONE TOUCH ULTRA TEST test strip Generic drug:  glucose blood USE TO CHECK BLOOD SUGAR ONCE DAILY What changed:  See the new instructions.   pantoprazole 40 MG tablet Commonly known as:  PROTONIX TAKE 1 TABLET BY MOUTH  DAILY        DISCHARGE INSTRUCTIONS:  1.  Follow-up with PCP in 5 days 2.  Follow-up with neurology in 1 to 2 weeks 3.  Start aspirin and Plavix daily 4.  Increase Lipitor from 20 mg daily to 40 mg daily DIET:  Cardiac diet DISCHARGE CONDITION:  Stable ACTIVITY:  Activity as tolerated OXYGEN:  Home Oxygen: No.  Oxygen Delivery: room air DISCHARGE LOCATION:  home   If you experience worsening of your admission symptoms, develop shortness of breath, life threatening emergency, suicidal or homicidal thoughts you must seek medical attention immediately by calling 911 or  calling your MD immediately  if symptoms less severe.  You Must read complete instructions/literature along with all the possible adverse reactions/side effects for all the Medicines you take and that have been prescribed to you. Take any new Medicines after you have completely understood and accpet all the possible adverse reactions/side effects.   Please note  You were cared for by a hospitalist during your hospital stay. If you have any questions about your discharge medications or the care you received while you were in the hospital after you are discharged, you can call the unit and asked to speak with the hospitalist on call if the hospitalist that took care of you is not available. Once you are discharged, your primary care physician will handle any further medical issues. Please note that NO REFILLS for any discharge medications will be authorized once  you are discharged, as it is imperative that you return to your primary care physician (or establish a relationship with a primary care physician if you do not have one) for your aftercare needs so that they can reassess your need for medications and monitor your lab values.    On the day of Discharge:  VITAL SIGNS:  Blood pressure (!) 158/63, pulse 64, temperature 97.9 F (36.6 C), temperature source Oral, resp. rate 16, height '5\' 5"'$  (1.651 m), weight 67.6 kg, SpO2 93 %. PHYSICAL EXAMINATION:  GENERAL:  78 y.o.-year-old patient sitting up in chair with no acute distress.  EYES: Pupils equal, round, reactive to light and accommodation. No scleral icterus. Extraocular muscles intact.  HEENT: Head atraumatic, normocephalic. Oropharynx and nasopharynx clear.  NECK:  Supple, no jugular venous distention. No thyroid enlargement, no tenderness.  LUNGS: Normal breath sounds bilaterally, no wheezing, rales,rhonchi or crepitation. No use of accessory muscles of respiration.  CARDIOVASCULAR: S1, S2 normal. No murmurs, rubs, or gallops.  ABDOMEN:  Soft, non-tender, non-distended. Bowel sounds present. No organomegaly or mass.  EXTREMITIES: No pedal edema, cyanosis, or clubbing.  NEUROLOGIC: Cranial nerves II through XII are intact. Muscle strength 5/5 in all extremities. Sensation decreased to light touch in the left upper extremity. Gait not checked.  PSYCHIATRIC: The patient is alert and oriented x 3.  SKIN: No obvious rash, lesion, or ulcer.  DATA REVIEW:   CBC Recent Labs  Lab 05/07/18 0355  WBC 5.9  HGB 11.3*  HCT 34.5*  PLT 184    Chemistries  Recent Labs  Lab 05/07/18 0355  NA 140  K 4.3  CL 102  CO2 31  GLUCOSE 166*  BUN 29*  CREATININE 1.22*  CALCIUM 8.6*     Microbiology Results  Results for orders placed or performed during the hospital encounter of 03/30/17  Culture, blood (routine x 2)     Status: None   Collection Time: 03/30/17  1:43 PM  Result Value Ref Range Status   Specimen Description BLOOD BLOOD RIGHT FOREARM  Final   Special Requests   Final    BOTTLES DRAWN AEROBIC AND ANAEROBIC Blood Culture adequate volume   Culture   Final    NO GROWTH 5 DAYS Performed at [facility], [address], [place], [place] [address]    Report Status 04/04/2017 FINAL  Final  Culture, blood (routine x 2)     Status: None   Collection Time: 03/30/17  1:43 PM  Result Value Ref Range Status   Specimen Description BLOOD RIGHT ANTECUBITAL  Final   Special Requests   Final    BOTTLES DRAWN AEROBIC AND ANAEROBIC RIGHT ANTECUBITAL   Culture   Final    NO GROWTH 5 DAYS Performed at [facility], [address], [place], [place] [address]    Report Status 04/04/2017 FINAL  Final  Urine culture     Status: Abnormal   Collection Time: 04/01/17  2:00 PM  Result Value Ref Range Status   Specimen Description   Final    URINE, RANDOM Performed at [facility], [address], [place], [place] [address]    Special Requests   Final    NONE Performed at [facility], [address] [place], [place] [address]    Culture MULTIPLE SPECIES PRESENT, SUGGEST RECOLLECTION (A)  Final   Report Status 04/03/2017 FINAL  Final    RADIOLOGY:  Ct Head Wo Contrast  Result Date: 05/06/2018 CLINICAL DATA:  Left-sided weakness  for 2 days. Multiple falls. EXAM: CT HEAD WITHOUT CONTRAST CT CERVICAL SPINE WITHOUT CONTRAST TECHNIQUE: Multidetector CT imaging of the head and cervical spine was performed following the standard protocol without intravenous contrast. Multiplanar CT image reconstructions of the cervical spine were also generated. COMPARISON:  Head CT 03/30/2017 and MRI 11/20/2015. Cervical spine MRI 09/11/2013. FINDINGS: CT HEAD FINDINGS Brain: There is no evidence of acute infarct, intracranial hemorrhage, mass, midline shift, or extra-axial fluid collection. Mild ventriculomegaly has minimally progressed from the prior CT and is favored to reflect central predominant cerebral atrophy with communicating hydrocephalus less likely. Patchy cerebral white matter hypodensities may have also slightly progressed and are nonspecific but compatible with mild-to-moderate chronic small vessel ischemic disease. Vascular: Calcified atherosclerosis at the skull base. No hyperdense vessel. Skull: No fracture or suspicious osseous lesion. Sinuses/Orbits: Small volume sphenoid sinus fluid, less than on the prior CT. Clear mastoid air cells. Left cataract extraction. Other: None. CT CERVICAL SPINE FINDINGS Alignment: Chronic cervical spine straightening. Trace fused retrolisthesis of C5 on C6. Skull base and vertebrae: C5-6 ACDF with solid interbody arthrodesis as well as right facet ankylosis. No acute fracture or suspicious osseous lesion. Soft tissues and spinal canal: No prevertebral fluid or swelling. No visible canal hematoma. Disc levels: Mild cervical spondylosis. No osseous spinal canal or neural foraminal stenosis. Upper chest: Centrilobular emphysema. Other: Asymmetric enlargement of the  right thyroid lobe with multiple small nodules posteriorly. Left carotid endarterectomy. Mild right carotid artery calcification with retropharyngeal course of the proximal right ICA. IMPRESSION: 1. No evidence of acute intracranial abnormality. 2. Mild-to-moderate chronic small vessel ischemic disease and cerebral atrophy. 3. No evidence of acute fracture or traumatic subluxation in the cervical spine. Electronically Signed   By: Logan Bores M.D.   On: 05/06/2018 12:28   Ct Cervical Spine Wo Contrast  Result Date: 05/06/2018 CLINICAL DATA:  Left-sided weakness for 2 days. Multiple falls. EXAM: CT HEAD WITHOUT CONTRAST CT CERVICAL SPINE WITHOUT CONTRAST TECHNIQUE: Multidetector CT imaging of the head and cervical spine was performed following the standard protocol without intravenous contrast. Multiplanar CT image reconstructions of the cervical spine were also generated. COMPARISON:  Head CT 03/30/2017 and MRI 11/20/2015. Cervical spine MRI 09/11/2013. FINDINGS: CT HEAD FINDINGS Brain: There is no evidence of acute infarct, intracranial hemorrhage, mass, midline shift, or extra-axial fluid collection. Mild ventriculomegaly has minimally progressed from the prior CT and is favored to reflect central predominant cerebral atrophy with communicating hydrocephalus less likely. Patchy cerebral white matter hypodensities may have also slightly progressed and are nonspecific but compatible with mild-to-moderate chronic small vessel ischemic disease. Vascular: Calcified atherosclerosis at the skull base. No hyperdense vessel. Skull: No fracture or suspicious osseous lesion. Sinuses/Orbits: Small volume sphenoid sinus fluid, less than on the prior CT. Clear mastoid air cells. Left cataract extraction. Other: None. CT CERVICAL SPINE FINDINGS Alignment: Chronic cervical spine straightening. Trace fused retrolisthesis of C5 on C6. Skull base and vertebrae: C5-6 ACDF with solid interbody arthrodesis as well as right facet  ankylosis. No acute fracture or suspicious osseous lesion. Soft tissues and spinal canal: No prevertebral fluid or swelling. No visible canal hematoma. Disc levels: Mild cervical spondylosis. No osseous spinal canal or neural foraminal stenosis. Upper chest: Centrilobular emphysema. Other: Asymmetric enlargement of the right thyroid lobe with multiple small nodules posteriorly. Left carotid endarterectomy. Mild right carotid artery calcification with retropharyngeal course of the proximal right ICA. IMPRESSION: 1. No evidence of acute intracranial abnormality. 2. Mild-to-moderate chronic small vessel ischemic disease and cerebral  atrophy. 3. No evidence of acute fracture or traumatic subluxation in the cervical spine. Electronically Signed   By: Logan Bores M.D.   On: 05/06/2018 12:28   Mr Jodene Nam Head Wo Contrast  Result Date: 05/06/2018 CLINICAL DATA:  Acute RIGHT thalamic infarct. Continued surveillance. EXAM: MRA HEAD WITHOUT CONTRAST TECHNIQUE: Angiographic images of the Circle of Willis were obtained using MRA technique without intravenous contrast. COMPARISON:  MRA intracranial 08/30/2014.  MRI brain 05/06/2018. FINDINGS: Internal carotid arteries are patent throughout their cervical, petrous, cavernous, and supraclinoid segment. Mild irregularity at the cervical petrous junction on the RIGHT. Mild irregularity at the RIGHT ICA terminus. Mild irregularity of the BILATERAL M1 MCA segments. High-grade stenosis of the RIGHT M2 superior and into divisions proximally. Nonstenotic irregularity of the LEFT M2 branches. Nonstenotic irregularity of the A1 anterior cerebral arteries. Moderate irregularity of the A2 and A3 anterior cerebral arteries bilaterally. Patent anterior communicating artery. Fetal origin LEFT PCA. Moderate stenosis at its origin. Basilar artery widely patent with only mild irregularity. Both vertebrals contribute to formation of the basilar with mild irregularity of the proximal LEFT V4 segment  and mild to moderate irregularity of the proximal to mid RIGHT V4 segment. Mild irregularity of RIGHT P1 PCA. No saccular aneurysm.  No cerebellar branch occlusion. IMPRESSION: Stable widespread intracranial atherosclerotic change. No proximal large vessel flow reducing lesion is evident. The observed acute RIGHT thalamic subcentimeter infarct likely represents small vessel ischemic change. Overall stability from priors. Electronically Signed   By: Staci Righter M.D.   On: 05/06/2018 16:31   Mr Brain Wo Contrast  Result Date: 05/06/2018 CLINICAL DATA:  LEFT-sided weakness began 2 days ago. EXAM: MRI HEAD WITHOUT CONTRAST TECHNIQUE: Multiplanar, multiecho pulse sequences of the brain and surrounding structures were obtained without intravenous contrast. COMPARISON:  CT head 05/06/2018.  MR head 11/20/2015. FINDINGS: Brain: 5 mm focus of restricted diffusion affects the RIGHT medial dorsal thalamus, corresponding low ADC, consistent with acute infarction. No other areas of acute stroke are identified. No hemorrhage, mass lesion,  or extra-axial fluid. Premature for age cerebral and cerebellar atrophy. T2 and FLAIR hyperintensities throughout the periventricular and subcortical white matter consistent with small vessel disease. Facilitated diffusion involves a LEFT parietal periventricular area of gliosis. Ventriculomegaly out of proportion to the degree of cortical atrophy, with continuous periventricular T2 high signal, raises question normal pressure hydrocephalus. Vascular: Flow voids are maintained. Tiny focus of chronic hemorrhage, LEFT posterior temporal subcortical white matter. Skull and upper cervical spine: Unremarkable visualized calvarium, skullbase, and cervical vertebrae. Pituitary, pineal, cerebellar tonsils unremarkable. No upper cervical cord lesions. Sinuses/Orbits: Insert sinuses.  LEFT cataract extraction. Other: No mastoid fluid. IMPRESSION: Acute RIGHT thalamic infarct.  No acute hemorrhage.  Atrophy and small vessel disease. Progressive ventriculomegaly since 2017 could represent a component of normal pressure hydrocephalus. Electronically Signed   By: Staci Righter M.D.   On: 05/06/2018 12:53   US Carotid Bilateral  Result Date: 05/06/2018 CLINICAL DATA:  Stroke EXAM: BILATERAL CAROTID DUPLEX ULTRASOUND TECHNIQUE: Pearline Cables scale imaging, color Doppler and duplex ultrasound were performed of bilateral carotid and vertebral arteries in the neck. COMPARISON:  08/30/2014 FINDINGS: Criteria: Quantification of carotid stenosis is based on velocity parameters that correlate the residual internal carotid diameter with NASCET-based stenosis levels, using the diameter of the distal internal carotid lumen as the denominator for stenosis measurement. The following velocity measurements were obtained: RIGHT ICA: 81/20 cm/sec CCA: 06/30 cm/sec SYSTOLIC ICA/CCA RATIO:  1.2 ECA: 95 cm/sec LEFT ICA: 82/23 cm/sec CCA:  02/71 cm/sec SYSTOLIC ICA/CCA RATIO:  1.3 ECA: 106 cm/sec RIGHT CAROTID ARTERY: Intimal thickening in the common carotid artery. Mild eccentric plaque in the bulb and ICA origin. No high-grade stenosis. Normal waveforms and color Doppler signal. RIGHT VERTEBRAL ARTERY:  Normal flow direction and waveform. LEFT CAROTID ARTERY: Smooth nonocclusive plaque in the common carotid artery. Eccentric hypoechoic plaque in the bulb and proximal ICA resulting in at least mild stenosis. Normal waveforms and color Doppler signal however. LEFT VERTEBRAL ARTERY:  Normal flow direction and waveform. IMPRESSION: 1. Bilateral carotid bifurcation and proximal ICA plaque, resulting in less than 50% diameter ICA stenosis. 2.  Antegrade bilateral vertebral arterial flow. Electronically Signed   By: Lucrezia Europe M.D.   On: 05/06/2018 15:17     Management plans discussed with the patient, family and they are in agreement.  CODE STATUS: Full Code   TOTAL TIME TAKING CARE OF THIS PATIENT: 40 minutes.    Berna Spare Gauge Winski M.D on  05/07/2018 at 10:51 AM  Between 7am to 6pm - Pager - [phone]  After 6pm go to [email or web address] - password [email or web address] [facility]  [employer] [place] [facility]  Office  [phone]  CC: Primary care physician; Jerrol Banana., MD   Note: This dictation was prepared with Dragon dictation along with smaller phrase technology. Any transcriptional errors that result from this process are unintentional.

## 2018-05-07 NOTE — Progress Notes (Signed)
Chart reviewed. Pt visited. Bedside swallow eval revealed occasional difficulty with thin liquids by straw. Report to follow. Rec continue with current diet, no straws. ST to follow up with toleration.

## 2018-05-07 NOTE — Progress Notes (Signed)
Pt D/C to home with daughter. IV removed intact. VSS. Education completed. All questions answered. Belongings sent with pt.

## 2018-05-08 NOTE — Care Management Note (Signed)
Case Management Note  Patient Details  Name: [name] [name] MRN: [id number] Date of Birth: 1940/09/06  Subjective/Objective:  *Late Entry 2/2    Patient to be discharged per MD order. Orders in place for home health services on 2/1. Patient was discharged before [facility] could complete consult. However, spouse [name] was available. Spoke to him at length and he was under the impression that patient would discharge and resume her current outpatient PT regimen. He tells me she comes to he rehab dept here at the hospital and does PT. We talked about the recomendations of home health and he tells me that it is "unnecessary since she is already getting good therapy but that I better call her". I have had 3 unsuccessful attempts to reach her and have left HIPPA compliant voice mails to attempt to discuss planning. Spouse also now unavailable via telephone. Will fax outpatient rehab referral to rehab department in case there are new orders needed. Form completed by MD.                    Action/Plan:   Expected Discharge Date:  05/07/18               Expected Discharge Plan:  Home/Self Care  In-House Referral:     Discharge planning Services  CM Consult  Post Acute Care Choice:    Choice offered to:     DME Arranged:    DME Agency:     HH Arranged:  Patient Refused [place] Agency:     Status of Service:  Completed, signed off  If discussed at [employer] of Stay Meetings, dates discussed:    Additional Comments:  Latanya Maudlin, RN 05/08/2018, 10:19 AM

## 2018-05-09 ENCOUNTER — Ambulatory Visit (INDEPENDENT_AMBULATORY_CARE_PROVIDER_SITE_OTHER): Payer: Medicare Other | Admitting: Vascular Surgery

## 2018-05-09 ENCOUNTER — Encounter (INDEPENDENT_AMBULATORY_CARE_PROVIDER_SITE_OTHER): Payer: Medicare Other

## 2018-05-09 ENCOUNTER — Telehealth (INDEPENDENT_AMBULATORY_CARE_PROVIDER_SITE_OTHER): Payer: Self-pay | Admitting: Vascular Surgery

## 2018-05-09 ENCOUNTER — Telehealth: Payer: Self-pay

## 2018-05-09 NOTE — Telephone Encounter (Signed)
Unknown female called back and spoke with Tammy around the same time message was sent and she canceled appt and informed them if we needed to see her than we will contact them back. Tammy will call them back to reschedule the 1 year f/u appt per [facility]. Nothing further is needed

## 2018-05-09 NOTE — Telephone Encounter (Signed)
She does not, based on her hospital ultrasound and CT scan, as well as her ultrasound from one year ago, she would be ok to follow up in one year with Dr. Delana Meyer based on standard guidelines.

## 2018-05-09 NOTE — Telephone Encounter (Signed)
Transition Care Management Follow-up Telephone Call  Date of discharge and from where: [facility] on 05/07/18  How have you been since you were released from the hospital? Doing about the same, still having trouble getting around but is using the walker for assistance. Pt is still confused and has weakness on the left side but no worse. Declines fever, pain or n/v/d.   Any questions or concerns? No   Items Reviewed:  Did the pt receive and understand the discharge instructions provided? Yes   Medications obtained and verified? Declined reviewing list. Did clarify new medications and changes. Pt will review full list at [place].   Any new allergies since your discharge? No   Dietary orders reviewed? Yes  Do you have support at home? Yes   Other (ie: DME, Home Health, etc) Awaiting a call to set up in home PT.   Functional Questionnaire: (I = Independent and D = Dependent)  Bathing/Dressing- D   Meal Prep- D  Eating- I  Maintaining continence- I  Transferring/Ambulation- D, using a walker currently.  Managing Meds- D, husband manages.    Follow up appointments reviewed:    PCP Hospital f/u appt confirmed? Yes  Scheduled to see Dr. Rosanna Randy on 05/12/18 @ 11:20 AM.  [facility] f/u appt confirmed? No apt currently. Neurology number given to pt to call and set up apt (per d/c summary).   Are transportation arrangements needed? No   If their condition worsens, is the pt aware to call  their PCP or go to the ED? Yes  Was the patient provided with contact information for the PCP's office or ED? Yes  Was the pt encouraged to call back with questions or concerns? Yes

## 2018-05-09 NOTE — Telephone Encounter (Signed)
An unknown man left vm on nurse line stating pt was in hospital Friday and Saturday and already had testing done on her neck so they want to know does she need to keep her 1 o'clock appt that was scheduled for today 05/09/18

## 2018-05-10 ENCOUNTER — Ambulatory Visit: Payer: Medicare Other

## 2018-05-10 ENCOUNTER — Ambulatory Visit (INDEPENDENT_AMBULATORY_CARE_PROVIDER_SITE_OTHER): Payer: Medicare Other | Admitting: Family Medicine

## 2018-05-10 ENCOUNTER — Encounter: Payer: Self-pay | Admitting: Family Medicine

## 2018-05-10 VITALS — BP 142/78 | HR 71 | Temp 97.6°F | Wt 149.0 lb

## 2018-05-10 DIAGNOSIS — I2510 Atherosclerotic heart disease of native coronary artery without angina pectoris: Secondary | ICD-10-CM

## 2018-05-10 DIAGNOSIS — I10 Essential (primary) hypertension: Secondary | ICD-10-CM

## 2018-05-10 DIAGNOSIS — I639 Cerebral infarction, unspecified: Secondary | ICD-10-CM

## 2018-05-10 DIAGNOSIS — R531 Weakness: Secondary | ICD-10-CM

## 2018-05-10 DIAGNOSIS — C3411 Malignant neoplasm of upper lobe, right bronchus or lung: Secondary | ICD-10-CM

## 2018-05-10 DIAGNOSIS — E1342 Other specified diabetes mellitus with diabetic polyneuropathy: Secondary | ICD-10-CM

## 2018-05-10 MED ORDER — AMLODIPINE BESYLATE 5 MG PO TABS: 5.0000 mg | ORAL_TABLET | Freq: Every day | ORAL | 3 refills | Status: DC

## 2018-05-10 NOTE — Progress Notes (Signed)
Patient: [name] [name] Female    DOB: [birth year]   78 y.o.   MRN: [id number] Visit Date: 05/10/2018  Today's Provider: [name], MD   Chief Complaint  Patient presents with  . Hospitalization Follow-up   Subjective:     HPI  Follow up Hospitalization  This is a transition of  care visit. Patient was admitted to [facility] on 05/06/2018 and discharged on 05/07/2018. She had left-sided weakness and which is since resolved.  Speech is normal. She was treated for CVA. Treatment for this included started Aspirin and Plavix. Telephone follow up was done on 05/09/2018 with McKenzie She reports good compliance with treatment. She reports this condition is Improved. Patient states she is still having trouble getting around, but is using the walker for assistance. Patient is also experiencing confusion and left side weakness, but has not worsen.   ------------------------------------------------------------------------------------  Patient blood sugar and blood pressure has been high since hospital stay.   Allergies  Allergen Reactions  . Coconut Fatty Acids Swelling and Other (See Comments)    Throat swells     Current Outpatient Medications:  .  aspirin EC 81 MG EC tablet, Take 1 tablet (81 mg total) by mouth daily., Disp: 30 tablet, Rfl: 0 .  atorvastatin (LIPITOR) 40 MG tablet, Take 1 tablet (40 mg total) by mouth daily at 6 PM., Disp: 30 tablet, Rfl: 0 .  Blood Glucose Monitoring Suppl (ONE TOUCH ULTRA 2) w/Device KIT, 1 each by Does not apply route See admin instructions., Disp: 1 each, Rfl: 0 .  budesonide-formoterol (SYMBICORT) 160-4.5 MCG/ACT inhaler, Inhale 2 puffs into the lungs 2 (two) times daily., Disp: 3 Inhaler, Rfl: 3 .  clopidogrel (PLAVIX) 75 MG tablet, Take 1 tablet (75 mg total) by mouth daily., Disp: 30 tablet, Rfl: 0 .  fluticasone (FLONASE) 50 MCG/ACT nasal spray, Place 2 sprays into both nostrils daily., Disp: 16 g, Rfl: 6 .  gabapentin  (NEURONTIN) 600 MG tablet, TAKE ONE-HALF TABLET BY  MOUTH TWO TIMES DAILY (Patient taking differently: Take 300 mg by mouth 2 (two) times daily. ), Disp: 90 tablet, Rfl: 3 .  glimepiride (AMARYL) 2 MG tablet, Take 1 tablet (2 mg total) by mouth 2 (two) times daily., Disp: 180 tablet, Rfl: 3 .  isosorbide mononitrate (IMDUR) 30 MG 24 hr tablet, TAKE 1 TABLET BY MOUTH  DAILY (Patient taking differently: Take 30 mg by mouth daily. ), Disp: 90 tablet, Rfl: 3 .  LORazepam (ATIVAN) 1 MG tablet, Take 1 tablet (1 mg total) by mouth 2 (two) times daily as needed for anxiety., Disp: 30 tablet, Rfl: 5 .  magnesium oxide (MAG-OX) 400 (241.3 MG) MG tablet, Take 400 mg by mouth 2 (two) times daily. , Disp: , Rfl:  .  metoprolol tartrate (LOPRESSOR) 25 MG tablet, TAKE 1 TABLET BY MOUTH TWO  TIMES DAILY (Patient taking differently: Take 25 mg by mouth 2 (two) times daily. ), Disp: 180 tablet, Rfl: 3 .  Multiple Vitamins-Minerals (ICAPS) CAPS, Take 1 capsule by mouth 2 (two) times daily. , Disp: , Rfl:  .  nitroGLYCERIN (NITROSTAT) 0.4 MG SL tablet, Place 1 tablet (0.4 mg total) under the tongue every 5 (five) minutes as needed for chest pain., Disp: 25 tablet, Rfl: 3 .  ONE TOUCH ULTRA TEST test strip, USE TO CHECK BLOOD SUGAR ONCE DAILY (Patient taking differently: 1 each by Other route daily. ), Disp: 100 each, Rfl: 11 .  pantoprazole (PROTONIX) 40 MG tablet,  TAKE 1 TABLET BY MOUTH  DAILY (Patient taking differently: Take 40 mg by mouth daily. ), Disp: 90 tablet, Rfl: 3 No current facility-administered medications for this visit.   Facility-Administered Medications Ordered in Other Visits:  .  ondansetron (ZOFRAN) 8 mg, dexamethasone (DECADRON) 10 mg in sodium chloride 0.9 % 50 mL IVPB, , Intravenous, Once, Finnegan, Kathlene November, MD  Review of Systems  Constitutional: Negative.   HENT: Negative.   Eyes: Negative.   Respiratory: Negative.   Cardiovascular: Negative.   Endocrine: Negative.   Musculoskeletal:  Negative.   Allergic/Immunologic: Negative.   Neurological: Positive for weakness.  Psychiatric/Behavioral: Positive for confusion.    Social History   Tobacco Use  . Smoking status: Former Smoker    Packs/day: 1.00    Years: 50.00    Pack years: 50.00    Types: Cigarettes    Last attempt to quit: 10/03/2015    Years since quitting: 2.6  . Smokeless tobacco: Never Used  . Tobacco comment: smokes 3 cigs daily 05/06/15. Pt instructed to quit.  Substance Use Topics  . Alcohol use: No    Alcohol/week: 0.0 standard drinks      Objective:   BP (!) 142/78 (BP Location: Right Arm, Patient Position: Sitting, Cuff Size: Normal)   Pulse 71   Temp 97.6 F (36.4 C) (Oral)   Wt 149 lb (67.6 kg)   SpO2 92%   BMI 24.79 kg/m  Vitals:   05/10/18 1440  BP: (!) 142/78  Pulse: 71  Temp: 97.6 F (36.4 C)  TempSrc: Oral  SpO2: 92%  Weight: 149 lb (67.6 kg)     Physical Exam Constitutional:      Appearance: She is well-developed.  HENT:     Head: Normocephalic and atraumatic.     Right Ear: External ear normal.     Left Ear: External ear normal.     Nose: Nose normal.  Eyes:     General: No scleral icterus.    Conjunctiva/sclera: Conjunctivae normal.  Neck:     Thyroid: No thyromegaly.  Cardiovascular:     Rate and Rhythm: Normal rate and regular rhythm.     Heart sounds: Normal heart sounds.  Pulmonary:     Effort: Pulmonary effort is normal.     Breath sounds: Normal breath sounds.  Abdominal:     Palpations: Abdomen is soft.  Skin:    General: Skin is warm and dry.  Neurological:     General: No focal deficit present.     Mental Status: She is alert and oriented to person, place, and time. Mental status is at baseline.  Psychiatric:        Mood and Affect: Mood normal.        Behavior: Behavior normal.        Thought Content: Thought content normal.        Judgment: Judgment normal.         Assessment & Plan    1. Cerebrovascular accident (CVA), unspecified  mechanism ([place]) No obvious sequelae.  All risk factors are treated.  Claims to have some left leg weakness.  I did not watch her ambulate today.  2. HYPERTENSION, BENIGN Amlodipine to get systolic at least less than 140 if possible. - amLODipine (NORVASC) 5 MG tablet; Take 1 tablet (5 mg total) by mouth daily.  Dispense: 90 tablet; Refill: 3  3. Left-sided weakness Neurologically she is intact the patient claims to be weak.  I think this is overall weakness as opposed  to of neurologic deficit. - Ambulatory referral to Physical Therapy  4. CAD in native artery   5. Primary cancer of right upper lobe of lung ([place])   6. Diabetic polyneuropathy associated with other specified diabetes mellitus ([place])     I have done the exam and reviewed the above chart and it is accurate to the best of my knowledge. Development worker, community has been used in this note in any air is in the dictation or transcription are unintentional.  [name], MD  [place] [facility]

## 2018-05-12 ENCOUNTER — Telehealth: Payer: Self-pay | Admitting: Family Medicine

## 2018-05-12 ENCOUNTER — Ambulatory Visit: Payer: Medicare Other

## 2018-05-12 ENCOUNTER — Ambulatory Visit: Payer: Medicare Other | Admitting: Family Medicine

## 2018-05-12 DIAGNOSIS — I639 Cerebral infarction, unspecified: Secondary | ICD-10-CM

## 2018-05-12 DIAGNOSIS — R531 Weakness: Secondary | ICD-10-CM

## 2018-05-12 NOTE — Telephone Encounter (Signed)
Pt's husband wanting to know the status of the in home therapy and home care (baths) that pt was to receive.   Please call pt's husband back to discuss.  Thanks, [employer]

## 2018-05-13 ENCOUNTER — Other Ambulatory Visit: Payer: Self-pay

## 2018-05-13 NOTE — Patient Outreach (Signed)
[facility]) Care Management  05/13/2018  DALANA PFAHLER 1941-02-02 [id number]    EMMI-General Discharge RED ON EMMI ALERT Day # 4 Date: 05/12/2018 Red Alert Reason: " Unfilled prescriptions? Yes  Sad/hopeless/anxious/empty? Yes"   Outreach attempt # 1 to patient. Spoke with patein who voices she is doing fine since returning home. Reviewed and addressed red alerts. Patient voices error in response and that she did not respond that way. RN CM confirmed with patient that she has all her meds in the home and has no issues or concerns regarding them. She voices she has supportive spouse in the home who is assisting her as needed. She has completed PCP follow up appt on 05/10/2018. Patient reports she is working with MD office and awaiting to hear back regarding possibly getting HHPT and aide. She is aware to follow up with office if she does not hear anything soon. She denies any further RN CM needs or concerns at this time. Patient has completed automated EMMI post discharge calls.     Plan: RN CM will close case as no further interventions needed at this time. Enzo Montgomery, RN,BSN,CCM [place] [facility] Telephonic Care Management Coordinator Direct Phone: [phone] Toll Free: [phone] Fax: [phone]

## 2018-05-13 NOTE — Telephone Encounter (Signed)
Dr Rosanna Randy do you know anything about this? I only see an order for PT

## 2018-05-16 ENCOUNTER — Ambulatory Visit: Payer: Medicare Other

## 2018-05-16 NOTE — Telephone Encounter (Signed)
Pt's husband checking on in home care for wife.  Please call husband back and advise.  Thanks, [employer]

## 2018-05-16 NOTE — Telephone Encounter (Signed)
Please advise 

## 2018-05-17 DIAGNOSIS — H903 Sensorineural hearing loss, bilateral: Secondary | ICD-10-CM | POA: Diagnosis not present

## 2018-05-17 DIAGNOSIS — H6060 Unspecified chronic otitis externa, unspecified ear: Secondary | ICD-10-CM | POA: Diagnosis not present

## 2018-05-17 DIAGNOSIS — H6122 Impacted cerumen, left ear: Secondary | ICD-10-CM | POA: Diagnosis not present

## 2018-05-17 NOTE — Telephone Encounter (Signed)
[facility] ED 

## 2018-05-17 NOTE — Telephone Encounter (Signed)
We can put in an order for home health.  I do not know if they will cover this type service or not.

## 2018-05-18 ENCOUNTER — Ambulatory Visit: Payer: Medicare Other

## 2018-05-23 NOTE — Telephone Encounter (Signed)
Order was placed today. Unsure if it would be covered. Did not realize order was not put in.

## 2018-05-25 ENCOUNTER — Other Ambulatory Visit: Payer: Self-pay | Admitting: Internal Medicine

## 2018-05-26 ENCOUNTER — Telehealth: Payer: Self-pay | Admitting: Family Medicine

## 2018-05-26 DIAGNOSIS — R0602 Shortness of breath: Secondary | ICD-10-CM | POA: Diagnosis not present

## 2018-05-26 DIAGNOSIS — J449 Chronic obstructive pulmonary disease, unspecified: Secondary | ICD-10-CM | POA: Diagnosis not present

## 2018-05-26 NOTE — Telephone Encounter (Signed)
Ronalee Belts from [place] [facility] called stating that they will need more specifics on home health referral for nursing.Also referral was put in for physical therapy but needs to be included in the home health referral. Can new order be placed ?

## 2018-05-28 ENCOUNTER — Other Ambulatory Visit: Payer: Self-pay | Admitting: Internal Medicine

## 2018-05-28 DIAGNOSIS — J449 Chronic obstructive pulmonary disease, unspecified: Secondary | ICD-10-CM | POA: Diagnosis not present

## 2018-05-28 NOTE — Telephone Encounter (Signed)
Please review

## 2018-05-31 ENCOUNTER — Ambulatory Visit (INDEPENDENT_AMBULATORY_CARE_PROVIDER_SITE_OTHER): Payer: Medicare Other | Admitting: Family Medicine

## 2018-05-31 ENCOUNTER — Encounter: Payer: Self-pay | Admitting: Family Medicine

## 2018-05-31 VITALS — BP 142/72 | HR 68 | Temp 97.9°F | Resp 20 | Wt 151.0 lb

## 2018-05-31 DIAGNOSIS — I639 Cerebral infarction, unspecified: Secondary | ICD-10-CM | POA: Diagnosis not present

## 2018-05-31 DIAGNOSIS — K219 Gastro-esophageal reflux disease without esophagitis: Secondary | ICD-10-CM

## 2018-05-31 DIAGNOSIS — I10 Essential (primary) hypertension: Secondary | ICD-10-CM

## 2018-05-31 DIAGNOSIS — M79604 Pain in right leg: Secondary | ICD-10-CM

## 2018-05-31 DIAGNOSIS — M79605 Pain in left leg: Secondary | ICD-10-CM

## 2018-05-31 DIAGNOSIS — C3490 Malignant neoplasm of unspecified part of unspecified bronchus or lung: Secondary | ICD-10-CM

## 2018-05-31 DIAGNOSIS — E1342 Other specified diabetes mellitus with diabetic polyneuropathy: Secondary | ICD-10-CM

## 2018-05-31 DIAGNOSIS — E782 Mixed hyperlipidemia: Secondary | ICD-10-CM | POA: Diagnosis not present

## 2018-05-31 NOTE — Telephone Encounter (Signed)
Patient seen in the office today. Will update order.

## 2018-05-31 NOTE — Progress Notes (Deleted)
Patient: [name] [name] Female    DOB: [birth year]   78 y.o.   MRN: [id number] Visit Date: 05/31/2018  Today's Provider: [name], MD   No chief complaint on file.  Subjective:     HPI  Hypertension, follow-up:  BP Readings from Last 3 Encounters:  05/10/18 (!) 142/78  05/07/18 (!) 143/59  05/06/18 (!) 122/55    She was last seen for hypertension 3 weeks ago.  BP at that visit was 142/78. Management since that visit includes adding amlodipine 54m daily. She reports {excellent/good/fair/poor:19665} compliance with treatment. She {ACTION; IS/IS [id number]}having side effects. *** She {is/is not:9024} exercising. She {is/is not:9024} adherent to low salt diet.   Outside blood pressures are ***. She is experiencing {Symptoms; cardiac:12860}.  Patient denies {Symptoms; cardiac:12860}.   Cardiovascular risk factors include {cv risk factors:510}.  Use of agents associated with hypertension: {bp agents assoc with hypertension:511::"none"}.     Weight trend: {trend:16658} Wt Readings from Last 3 Encounters:  05/10/18 149 lb (67.6 kg)  05/06/18 149 lb (67.6 kg)  04/21/18 152 lb (68.9 kg)      Allergies  Allergen Reactions  . Coconut Fatty Acids Swelling and Other (See Comments)    Throat swells     Current Outpatient Medications:  .  amLODipine (NORVASC) 5 MG tablet, Take 1 tablet (5 mg total) by mouth daily., Disp: 90 tablet, Rfl: 3 .  aspirin EC 81 MG EC tablet, Take 1 tablet (81 mg total) by mouth daily., Disp: 30 tablet, Rfl: 0 .  atorvastatin (LIPITOR) 40 MG tablet, Take 1 tablet (40 mg total) by mouth daily at 6 PM., Disp: 30 tablet, Rfl: 0 .  Blood Glucose Monitoring Suppl (ONE TOUCH ULTRA 2) w/Device KIT, 1 each by Does not apply route See admin instructions., Disp: 1 each, Rfl: 0 .  budesonide-formoterol (SYMBICORT) 160-4.5 MCG/ACT inhaler, Inhale 2 puffs into the lungs 2 (two) times daily., Disp: 3 Inhaler, Rfl: 3 .  clopidogrel (PLAVIX) 75  MG tablet, Take 1 tablet (75 mg total) by mouth daily., Disp: 30 tablet, Rfl: 0 .  fluticasone (FLONASE) 50 MCG/ACT nasal spray, Place 2 sprays into both nostrils daily., Disp: 16 g, Rfl: 6 .  gabapentin (NEURONTIN) 600 MG tablet, TAKE ONE-HALF TABLET BY  MOUTH TWO TIMES DAILY (Patient taking differently: Take 300 mg by mouth 2 (two) times daily. ), Disp: 90 tablet, Rfl: 3 .  glimepiride (AMARYL) 2 MG tablet, Take 1 tablet (2 mg total) by mouth 2 (two) times daily., Disp: 180 tablet, Rfl: 3 .  isosorbide mononitrate (IMDUR) 30 MG 24 hr tablet, TAKE 1 TABLET BY MOUTH  DAILY (Patient taking differently: Take 30 mg by mouth daily. ), Disp: 90 tablet, Rfl: 3 .  LORazepam (ATIVAN) 1 MG tablet, Take 1 tablet (1 mg total) by mouth 2 (two) times daily as needed for anxiety., Disp: 30 tablet, Rfl: 5 .  magnesium oxide (MAG-OX) 400 (241.3 MG) MG tablet, Take 400 mg by mouth 2 (two) times daily. , Disp: , Rfl:  .  metoprolol tartrate (LOPRESSOR) 25 MG tablet, TAKE 1 TABLET BY MOUTH TWO  TIMES DAILY (Patient taking differently: Take 25 mg by mouth 2 (two) times daily. ), Disp: 180 tablet, Rfl: 3 .  Multiple Vitamins-Minerals (ICAPS) CAPS, Take 1 capsule by mouth 2 (two) times daily. , Disp: , Rfl:  .  nitroGLYCERIN (NITROSTAT) 0.4 MG SL tablet, Place 1 tablet (0.4 mg total) under the tongue every 5 (five) minutes  as needed for chest pain., Disp: 25 tablet, Rfl: 3 .  ONE TOUCH ULTRA TEST test strip, USE TO CHECK BLOOD SUGAR ONCE DAILY (Patient taking differently: 1 each by Other route daily. ), Disp: 100 each, Rfl: 11 .  pantoprazole (PROTONIX) 40 MG tablet, TAKE 1 TABLET BY MOUTH  DAILY (Patient taking differently: Take 40 mg by mouth daily. ), Disp: 90 tablet, Rfl: 3 No current facility-administered medications for this visit.   Facility-Administered Medications Ordered in Other Visits:  .  ondansetron (ZOFRAN) 8 mg, dexamethasone (DECADRON) 10 mg in sodium chloride 0.9 % 50 mL IVPB, , Intravenous, Once,  Finnegan, Kathlene November, MD  Review of Systems  Social History   Tobacco Use  . Smoking status: Former Smoker    Packs/day: 1.00    Years: 50.00    Pack years: 50.00    Types: Cigarettes    Last attempt to quit: 10/03/2015    Years since quitting: 2.6  . Smokeless tobacco: Never Used  . Tobacco comment: smokes 3 cigs daily 05/06/15. Pt instructed to quit.  Substance Use Topics  . Alcohol use: No    Alcohol/week: 0.0 standard drinks      Objective:   There were no vitals taken for this visit. There were no vitals filed for this visit.   Physical Exam      Assessment & Plan        [name], MD  [facility]

## 2018-05-31 NOTE — Progress Notes (Signed)
[name] [name]  MRN: [id number] DOB: 1940/08/18  Subjective:  HPI   The patient is a 78 year old female who presents for follow up of hypertension.  She was last seen on 05/10/18.  Patient was placed on Amlodipine in an attempt to get her systolic reading below 540. Patient reports that she has tolerated the medication well. She does not check BP at home. Denies any headaches or dizzy spells.  BP Readings from Last 3 Encounters:  05/31/18 (!) 142/72  05/10/18 (!) 142/78  05/07/18 (!) 143/59   Patient is also requesting a home health referral to help with daily activities. Patient reports that she does need assistance with walking, bathing, and getting around the house.   Patient Active Problem List   Diagnosis Date Noted  . Stroke (cerebrum) ([place]) 05/06/2018  . Malnutrition of moderate degree 03/31/2017  . Acute respiratory failure with hypoxia ([place]) 03/30/2017  . AKI (acute kidney injury) ([place]) 07/15/2016  . Protein-calorie malnutrition, severe 02/08/2016  . Primary cancer of right upper lobe of lung ([place]) 11/20/2015  . Abnormal CT lung screening 10/17/2015  . Personal history of tobacco use, presenting hazards to health 10/15/2015  . Chronic vulvitis 09/26/2014  . Allergic reaction 09/26/2014  . Carotid stenosis 08/30/2014  . Cervical nerve root disorder 08/10/2014  . CAD in native artery 08/10/2014  . B12 deficiency 08/10/2014  . Back ache 08/10/2014  . Bronchitis, chronic ([place]) 08/10/2014  . Diabetes mellitus with polyneuropathy ([place]) 08/10/2014  . Can't get food down 08/10/2014  . Eczema of external ear 08/10/2014  . Accumulation of fluid in tissues 08/10/2014  . Gout 08/10/2014  . Adult hypothyroidism 08/10/2014  . Mononeuritis 08/10/2014  . Muscle ache 08/10/2014  . Disorder of peripheral nervous system 08/10/2014  . Lesion of vulva 08/10/2014  . Cervical spondylosis with radiculopathy 10/19/2013  . COPD exacerbation ([place]) 03/29/2013  . CAD (coronary artery  disease) 06/22/2011  . COPD (chronic obstructive pulmonary disease) with emphysema ([place]) 03/07/2010  . CHEST PAIN UNSPECIFIED 07/22/2009  . HLD (hyperlipidemia) 04/01/2009  . Malaise and fatigue 04/01/2009  . Hyperlipidemia 01/18/2009  . TOBACCO ABUSE 01/18/2009  . HYPERTENSION, BENIGN 01/18/2009  . CLAUDICATION 01/18/2009  . Pain in limb 01/18/2009  . CAFL (chronic airflow limitation) ([place]) 01/20/2007  . Late effects of cerebrovascular disease 01/10/2007  . Essential (primary) hypertension 12/22/2006    Past Medical History:  Diagnosis Date  . Abnormal CT lung screening 10/17/2015  . COPD (chronic obstructive pulmonary disease) ([place])   . Coronary artery disease, non-occlusive    a. cath 2006: min nonobs CAD; b. cath 12/2010: cath LAD 50%, RCA 60%; c. 08/2013: Minimal luminal irregs, right dominant system with no significant CAD, diffuse luminal irregs noted. Normal EF 55%, no AS or MS.   . Diabetes mellitus   . Hyperlipemia    Followed by Dr. Rosanna Randy  . Hypertension   . Lung cancer ([place])   . Macular degeneration    rt  . Personal history of tobacco use, presenting hazards to health 10/15/2015  . Pneumonia    hx  . Shortness of breath   . Stroke [facility])     Social History   Socioeconomic History  . Marital status: Married    Spouse name: Not on file  . Number of children: 4  . Years of education: Not on file  . Highest education level: 11th grade  Occupational History  . Occupation: Retired, Licensed conveyancer: RETIRED  Social Needs  .  Financial resource strain: Not on file  . Food insecurity:    Worry: Never true    Inability: Never true  . Transportation needs:    Medical: No    Non-medical: No  Tobacco Use  . Smoking status: Former Smoker    Packs/day: 1.00    Years: 50.00    Pack years: 50.00    Types: Cigarettes    Last attempt to quit: 10/03/2015    Years since quitting: 2.6  . Smokeless tobacco: Never Used  . Tobacco comment: smokes 3 cigs  daily 05/06/15. Pt instructed to quit.  Substance and Sexual Activity  . Alcohol use: No    Alcohol/week: 0.0 standard drinks  . Drug use: No  . Sexual activity: Never  Lifestyle  . Physical activity:    Days per week: Not on file    Minutes per session: Not on file  . Stress: Not at all  Relationships  . Social connections:    Talks on phone: Not on file    Gets together: Not on file    Attends religious service: Not on file    Active member of club or organization: Not on file    Attends meetings of clubs or organizations: Not on file    Relationship status: Not on file  . Intimate partner violence:    Fear of current or ex partner: Not on file    Emotionally abused: Not on file    Physically abused: Not on file    Forced sexual activity: Not on file  Other Topics Concern  . Not on file  Social History Narrative   Married with 4 children   Gets regular exercise   Lives at home with her husband.  Ambulates with a cane.    Outpatient Encounter Medications as of 05/31/2018  Medication Sig  . amLODipine (NORVASC) 5 MG tablet Take 1 tablet (5 mg total) by mouth daily.  Marland Kitchen aspirin EC 81 MG EC tablet Take 1 tablet (81 mg total) by mouth daily.  Marland Kitchen atorvastatin (LIPITOR) 40 MG tablet Take 1 tablet (40 mg total) by mouth daily at 6 PM.  . Blood Glucose Monitoring Suppl (ONE TOUCH ULTRA 2) w/Device KIT 1 each by Does not apply route See admin instructions.  . budesonide-formoterol (SYMBICORT) 160-4.5 MCG/ACT inhaler Inhale 2 puffs into the lungs 2 (two) times daily.  . clopidogrel (PLAVIX) 75 MG tablet Take 1 tablet (75 mg total) by mouth daily.  . fluticasone (FLONASE) 50 MCG/ACT nasal spray Place 2 sprays into both nostrils daily.  Marland Kitchen gabapentin (NEURONTIN) 600 MG tablet TAKE ONE-HALF TABLET BY  MOUTH TWO TIMES DAILY (Patient taking differently: Take 300 mg by mouth 2 (two) times daily. )  . glimepiride (AMARYL) 2 MG tablet Take 1 tablet (2 mg total) by mouth 2 (two) times daily.  .  isosorbide mononitrate (IMDUR) 30 MG 24 hr tablet TAKE 1 TABLET BY MOUTH  DAILY (Patient taking differently: Take 30 mg by mouth daily. )  . LORazepam (ATIVAN) 1 MG tablet Take 1 tablet (1 mg total) by mouth 2 (two) times daily as needed for anxiety.  . magnesium oxide (MAG-OX) 400 (241.3 MG) MG tablet Take 400 mg by mouth 2 (two) times daily.   . metoprolol tartrate (LOPRESSOR) 25 MG tablet TAKE 1 TABLET BY MOUTH TWO  TIMES DAILY (Patient taking differently: Take 25 mg by mouth 2 (two) times daily. )  . Multiple Vitamins-Minerals (ICAPS) CAPS Take 1 capsule by mouth 2 (two) times daily.   Marland Kitchen  nitroGLYCERIN (NITROSTAT) 0.4 MG SL tablet Place 1 tablet (0.4 mg total) under the tongue every 5 (five) minutes as needed for chest pain.  . ONE TOUCH ULTRA TEST test strip USE TO CHECK BLOOD SUGAR ONCE DAILY (Patient taking differently: 1 each by Other route daily. )  . pantoprazole (PROTONIX) 40 MG tablet TAKE 1 TABLET BY MOUTH  DAILY (Patient taking differently: Take 40 mg by mouth daily. )   Facility-Administered Encounter Medications as of 05/31/2018  Medication  . ondansetron (ZOFRAN) 8 mg, dexamethasone (DECADRON) 10 mg in sodium chloride 0.9 % 50 mL IVPB    Allergies  Allergen Reactions  . Coconut Fatty Acids Swelling and Other (See Comments)    Throat swells    Review of Systems  Constitutional: Negative.   HENT: Negative for tinnitus.   Eyes: Negative.   Respiratory: Negative.   Cardiovascular: Negative for leg swelling.  Gastrointestinal: Negative.   Musculoskeletal: Negative for myalgias.  Skin:       Dry skin.  Neurological: Positive for weakness. Negative for dizziness, tingling, sensory change, speech change, focal weakness and headaches.  Endo/Heme/Allergies: Negative.   Psychiatric/Behavioral: Negative for depression. The patient is not nervous/anxious and does not have insomnia.     Objective:  BP (!) 142/72 (BP Location: Left Arm, Patient Position: Sitting, Cuff Size: Normal)    Pulse 68   Temp 97.9 F (36.6 C)   Resp 20   Wt 151 lb (68.5 kg)   SpO2 96%   BMI 25.13 kg/m   Physical Exam  Constitutional: She is oriented to person, place, and time and well-developed, well-nourished, and in no distress.  HENT:  Head: Normocephalic and atraumatic.  Right Ear: External ear normal.  Left Ear: External ear normal.  Nose: Nose normal.  Mouth/Throat: Oropharynx is clear and moist.  Eyes: Pupils are equal, round, and reactive to light. Conjunctivae are normal. No scleral icterus.  Neck: Normal range of motion. No thyromegaly present.  Cardiovascular: Normal rate, regular rhythm and normal heart sounds.  Pulmonary/Chest: Effort normal and breath sounds normal.  Abdominal: Soft.  Neurological: She is alert and oriented to person, place, and time. Gait normal. GCS score is 15.  Skin: Skin is warm and dry.  Psychiatric: Mood, memory, affect and judgment normal.    Assessment and Plan :  1. HYPERTENSION, BENIGN  - metoprolol tartrate (LOPRESSOR) 25 MG tablet; Take 1 tablet (25 mg total) by mouth 2 (two) times daily.  Dispense: 180 tablet; Refill: 3  2. Cerebrovascular accident (CVA), unspecified mechanism ([place]) Left arm weakness improving.  Transition of care visit.  3. Mixed hyperlipidemia  - atorvastatin (LIPITOR) 40 MG tablet; Take 1 tablet (40 mg total) by mouth daily at 6 PM.  Dispense: 30 tablet; Refill: 0  4. Diabetic polyneuropathy associated with other specified diabetes mellitus ([place])  - glimepiride (AMARYL) 2 MG tablet; Take 1 tablet (2 mg total) by mouth 2 (two) times daily.  Dispense: 180 tablet; Refill: 3  5. Gastroesophageal reflux disease, esophagitis presence not specified  - pantoprazole (PROTONIX) 40 MG tablet; Take 1 tablet (40 mg total) by mouth daily.  Dispense: 90 tablet; Refill: 3  6. Pain in both lower extremities  - gabapentin (NEURONTIN) 600 MG tablet; Take 0.5 tablets (300 mg total) by mouth 2 (two) times daily.  Dispense: 180  tablet; Refill: 3  7. Malignant neoplasm of lung, unspecified laterality, unspecified part of lung ([place])  - Ambulatory referral to [facility]  I have done the exam and reviewed  the chart and it is accurate to the best of my knowledge. Development worker, community has been used and  any errors in dictation or transcription are unintentional. Miguel Aschoff M.D. [facility]

## 2018-05-31 NOTE — Telephone Encounter (Signed)
ok 

## 2018-06-01 ENCOUNTER — Other Ambulatory Visit: Payer: Self-pay | Admitting: Internal Medicine

## 2018-06-01 ENCOUNTER — Other Ambulatory Visit: Payer: Self-pay | Admitting: Family Medicine

## 2018-06-01 MED ORDER — CLOPIDOGREL BISULFATE 75 MG PO TABS: 75.0000 mg | ORAL_TABLET | Freq: Every day | ORAL | 3 refills | Status: DC

## 2018-06-01 NOTE — Telephone Encounter (Signed)
[facility] faxed refill request for the following medications:  clopidogrel (PLAVIX) 75 MG tablet   Please advise.

## 2018-06-02 ENCOUNTER — Telehealth: Payer: Self-pay

## 2018-06-02 ENCOUNTER — Telehealth: Payer: Self-pay | Admitting: Family Medicine

## 2018-06-02 MED ORDER — ATORVASTATIN CALCIUM 40 MG PO TABS: 40.0000 mg | ORAL_TABLET | Freq: Every day | ORAL | 0 refills | Status: DC

## 2018-06-02 MED ORDER — METOPROLOL TARTRATE 25 MG PO TABS: 25.0000 mg | ORAL_TABLET | Freq: Two times a day (BID) | ORAL | 3 refills | Status: DC

## 2018-06-02 MED ORDER — GABAPENTIN 600 MG PO TABS: 300.0000 mg | ORAL_TABLET | Freq: Two times a day (BID) | ORAL | 3 refills | Status: DC

## 2018-06-02 MED ORDER — GLIMEPIRIDE 2 MG PO TABS: 2.0000 mg | ORAL_TABLET | Freq: Two times a day (BID) | ORAL | 3 refills | Status: DC

## 2018-06-02 MED ORDER — PANTOPRAZOLE SODIUM 40 MG PO TBEC: 40.0000 mg | DELAYED_RELEASE_TABLET | Freq: Every day | ORAL | 3 refills | Status: DC

## 2018-06-02 NOTE — Telephone Encounter (Signed)
Waiting for Dr. Rosanna Randy to sign office note from 06/01/2018 regarding referral.

## 2018-06-02 NOTE — Telephone Encounter (Signed)
Dr. Rosanna Randy, can you loo

## 2018-06-02 NOTE — Telephone Encounter (Signed)
Pt's husband calling regarding the latest referral for the in home care. Wanting to know the status. Please call pt back at [phone] (H).  Thanks, [employer]

## 2018-06-06 ENCOUNTER — Telehealth: Payer: Self-pay | Admitting: Family Medicine

## 2018-06-06 NOTE — Telephone Encounter (Signed)
ok 

## 2018-06-06 NOTE — Telephone Encounter (Signed)
Do you want patient to have physical therapy to be done as an out pt or can this be added to her home health referral. If so I will need order fixed to have both nursing and PT

## 2018-06-06 NOTE — Telephone Encounter (Signed)
Dr Darnell Level Are we to order PT for her?

## 2018-06-06 NOTE — Telephone Encounter (Signed)
Done

## 2018-06-06 NOTE — Telephone Encounter (Signed)
Waiting for an order that [facility] has faxed our.  I need to complete it and have Dr Darnell Level sign it.

## 2018-06-06 NOTE — Telephone Encounter (Signed)
Pt's husband calling about the status of the PT orders for pt's help with walking. Making sure it's been resent in so pt can start back walking asap.  Thanks, [employer]

## 2018-06-07 ENCOUNTER — Ambulatory Visit (INDEPENDENT_AMBULATORY_CARE_PROVIDER_SITE_OTHER): Payer: Medicare Other

## 2018-06-07 VITALS — BP 124/56 | HR 62 | Temp 99.0°F | Ht 64.0 in | Wt 151.4 lb

## 2018-06-07 DIAGNOSIS — Z0000 Encounter for general adult medical examination without abnormal findings: Secondary | ICD-10-CM

## 2018-06-07 NOTE — Patient Instructions (Addendum)
[name] [name] , Thank you for taking time to come for your Medicare Wellness Visit. I appreciate your ongoing commitment to your health goals. Please review the following plan we discussed and let me know if I can assist you in the future.   Screening recommendations/referrals: Colonoscopy: No longer required.  Mammogram: No longer required.  Bone Density: Up to date, due 01/2022 Recommended yearly ophthalmology/optometry visit for glaucoma screening and checkup Recommended yearly dental visit for hygiene and checkup  Vaccinations: Influenza vaccine: Up to date Pneumococcal vaccine: Completed series Tdap vaccine: Up to date, due 01/2022 Shingles vaccine: Pt declines today.     Advanced directives: Please bring a copy of your POA (Power of Attorney) and/or Living Will to your next appointment.   Conditions/risks identified: Fall risk prevention; Recommend to drink at least 6-8 8oz glasses of water per day.   Next appointment: 07/28/18 with Dr Rosanna Randy   Preventive Care 43 Years and Older, Female Preventive care refers to lifestyle choices and visits with your health care provider that can promote health and wellness. What does preventive care include?  A yearly physical exam. This is also called an annual well check.  Dental exams once or twice a year.  Routine eye exams. Ask your health care provider how often you should have your eyes checked.  Personal lifestyle choices, including:  Daily care of your teeth and gums.  Regular physical activity.  Eating a healthy diet.  Avoiding tobacco and drug use.  Limiting alcohol use.  Practicing safe sex.  Taking low-dose aspirin every day.  Taking vitamin and mineral supplements as recommended by your health care provider. What happens during an annual well check? The services and screenings done by your health care provider during your annual well check will depend on your age, overall health, lifestyle risk factors, and family  history of disease. Counseling  Your health care provider may ask you questions about your:  Alcohol use.  Tobacco use.  Drug use.  Emotional well-being.  Home and relationship well-being.  Sexual activity.  Eating habits.  History of falls.  Memory and ability to understand (cognition).  Work and work Statistician.  Reproductive health. Screening  You may have the following tests or measurements:  Height, weight, and BMI.  Blood pressure.  Lipid and cholesterol levels. These may be checked every 5 years, or more frequently if you are over 86 years old.  Skin check.  Lung cancer screening. You may have this screening every year starting at age 24 if you have a 30-pack-year history of smoking and currently smoke or have quit within the past 15 years.  Fecal occult blood test (FOBT) of the stool. You may have this test every year starting at age 64.  Flexible sigmoidoscopy or colonoscopy. You may have a sigmoidoscopy every 5 years or a colonoscopy every 10 years starting at age 40.  Hepatitis C blood test.  Hepatitis B blood test.  Sexually transmitted disease (STD) testing.  Diabetes screening. This is done by checking your blood sugar (glucose) after you have not eaten for a while (fasting). You may have this done every 1-3 years.  Bone density scan. This is done to screen for osteoporosis. You may have this done starting at age 35.  Mammogram. This may be done every 1-2 years. Talk to your health care provider about how often you should have regular mammograms. Talk with your health care provider about your test results, treatment options, and if necessary, the need for more tests.  Vaccines  Your health care provider may recommend certain vaccines, such as:  Influenza vaccine. This is recommended every year.  Tetanus, diphtheria, and acellular pertussis (Tdap, Td) vaccine. You may need a Td booster every 10 years.  Zoster vaccine. You may need this after  age 23.  Pneumococcal 13-valent conjugate (PCV13) vaccine. One dose is recommended after age 39.  Pneumococcal polysaccharide (PPSV23) vaccine. One dose is recommended after age 33. Talk to your health care provider about which screenings and vaccines you need and how often you need them. This information is not intended to replace advice given to you by your health care provider. Make sure you discuss any questions you have with your health care provider. Document Released: 04/19/2015 Document Revised: 12/11/2015 Document Reviewed: 01/22/2015 Elsevier Interactive Patient Education  2017 [place] Prevention in the Home Falls can cause injuries. They can happen to people of all ages. There are many things you can do to make your home safe and to help prevent falls. What can I do on the outside of my home?  Regularly fix the edges of walkways and driveways and fix any cracks.  Remove anything that might make you trip as you walk through a door, such as a raised step or threshold.  Trim any bushes or trees on the path to your home.  Use bright outdoor lighting.  Clear any walking paths of anything that might make someone trip, such as rocks or tools.  Regularly check to see if handrails are loose or broken. Make sure that both sides of any steps have handrails.  Any raised decks and porches should have guardrails on the edges.  Have any leaves, snow, or ice cleared regularly.  Use sand or salt on walking paths during winter.  Clean up any spills in your garage right away. This includes oil or grease spills. What can I do in the bathroom?  Use night lights.  Install grab bars by the toilet and in the tub and shower. Do not use towel bars as grab bars.  Use non-skid mats or decals in the tub or shower.  If you need to sit down in the shower, use a plastic, non-slip stool.  Keep the floor dry. Clean up any water that spills on the floor as soon as it  happens.  Remove soap buildup in the tub or shower regularly.  Attach bath mats securely with double-sided non-slip rug tape.  Do not have throw rugs and other things on the floor that can make you trip. What can I do in the bedroom?  Use night lights.  Make sure that you have a light by your bed that is easy to reach.  Do not use any sheets or blankets that are too big for your bed. They should not hang down onto the floor.  Have a firm chair that has side arms. You can use this for support while you get dressed.  Do not have throw rugs and other things on the floor that can make you trip. What can I do in the kitchen?  Clean up any spills right away.  Avoid walking on wet floors.  Keep items that you use a lot in easy-to-reach places.  If you need to reach something above you, use a strong step stool that has a grab bar.  Keep electrical cords out of the way.  Do not use floor polish or wax that makes floors slippery. If you must use wax, use non-skid floor wax.  Do not have throw rugs and other things on the floor that can make you trip. What can I do with my stairs?  Do not leave any items on the stairs.  Make sure that there are handrails on both sides of the stairs and use them. Fix handrails that are broken or loose. Make sure that handrails are as long as the stairways.  Check any carpeting to make sure that it is firmly attached to the stairs. Fix any carpet that is loose or worn.  Avoid having throw rugs at the top or bottom of the stairs. If you do have throw rugs, attach them to the floor with carpet tape.  Make sure that you have a light switch at the top of the stairs and the bottom of the stairs. If you do not have them, ask someone to add them for you. What else can I do to help prevent falls?  Wear shoes that:  Do not have high heels.  Have rubber bottoms.  Are comfortable and fit you well.  Are closed at the toe. Do not wear sandals.  If you  use a stepladder:  Make sure that it is fully opened. Do not climb a closed stepladder.  Make sure that both sides of the stepladder are locked into place.  Ask someone to hold it for you, if possible.  Clearly mark and make sure that you can see:  Any grab bars or handrails.  First and last steps.  Where the edge of each step is.  Use tools that help you move around (mobility aids) if they are needed. These include:  Canes.  Walkers.  Scooters.  Crutches.  Turn on the lights when you go into a dark area. Replace any light bulbs as soon as they burn out.  Set up your furniture so you have a clear path. Avoid moving your furniture around.  If any of your floors are uneven, fix them.  If there are any pets around you, be aware of where they are.  Review your medicines with your doctor. Some medicines can make you feel dizzy. This can increase your chance of falling. Ask your doctor what other things that you can do to help prevent falls. This information is not intended to replace advice given to you by your health care provider. Make sure you discuss any questions you have with your health care provider. Document Released: 01/17/2009 Document Revised: 08/29/2015 Document Reviewed: 04/27/2014 Elsevier Interactive Patient Education  2017 [employer].

## 2018-06-07 NOTE — Progress Notes (Signed)
Subjective:   [name] [name] is a 78 y.o. female who presents for Medicare Annual (Subsequent) preventive examination.  Review of Systems:  N/A  Cardiac Risk Factors include: advanced age (>76mn, >>41women);diabetes mellitus;dyslipidemia;hypertension     Objective:     Vitals: BP (!) 124/56 (BP Location: Right Arm)   Pulse 62   Temp 99 F (37.2 C) (Oral)   Ht 5' 4" (1.626 m)   Wt 151 lb 6.4 oz (68.7 kg)   BMI 25.99 kg/m   Body mass index is 25.99 kg/m.  Advanced Directives 06/07/2018 05/06/2018 05/06/2018 10/12/2017 06/09/2017 05/31/2017 05/17/2017  Does Patient Have a Medical Advance Directive? Yes No No Yes Yes Yes Yes  Type of AParamedicof AOakdaleLiving will - - - HWindy HillsLiving will [place]Living will [place] will  Does patient want to make changes to medical advance directive? - - - No - Patient declined - - No - Patient declined  Copy of [place] Chart? No - copy requested - - - No - copy requested No - copy requested No - copy requested  Would patient like information on creating a medical advance directive? - No - Patient declined No - Patient declined - No - Patient declined - -    Tobacco Social History   Tobacco Use  Smoking Status Former Smoker  . Packs/day: 1.00  . Years: 50.00  . Pack years: 50.00  . Types: Cigarettes  . Last attempt to quit: 10/03/2015  . Years since quitting: 2.6  Smokeless Tobacco Never Used  Tobacco Comment   smokes 3 cigs daily 05/06/15. Pt instructed to quit.     Counseling given: Not Answered Comment: smokes 3 cigs daily 05/06/15. Pt instructed to quit.   Clinical Intake:  Pre-visit preparation completed: Yes  Pain : No/denies pain Pain Score: 0-No pain    Diabetes:  Is the patient diabetic?  Yes type 2 If diabetic, was a CBG obtained today?  No  Did the patient bring in their glucometer from home?  No  How  often do you monitor your CBG's? Once a day.   Financial Strains and Diabetes Management:  Are you having any financial strains with the device, your supplies or your medication? No .  Does the patient want to be seen by Chronic Care Management for management of their diabetes?  No  Would the patient like to be referred to a Nutritionist or for Diabetic Management?  No   Diabetic Exams:  Diabetic Eye Exam: Completed 07/06/17.   Diabetic Foot Exam: Completed 05/24/17. Pt has been advised about the importance in completing this exam. Note made to f/u on this at next apt.    Nutritional Status: BMI 25 -29 Overweight Nutritional Risks: None      Interpreter Needed?: No  Information [facility] :: [facility]  Past Medical History:  Diagnosis Date  . Abnormal CT lung screening 10/17/2015  . COPD (chronic obstructive pulmonary disease) ([place]   . Coronary artery disease, non-occlusive    a. cath 2006: min nonobs CAD; b. cath 12/2010: cath LAD 50%, RCA 60%; c. 08/2013: Minimal luminal irregs, right dominant system with no significant CAD, diffuse luminal irregs noted. Normal EF 55%, no AS or MS.   . Diabetes mellitus   . Hyperlipemia    Followed by Dr. GRosanna Randy . Hypertension   . Lung cancer [place]   . Macular degeneration    rt  .  Personal history of tobacco use, presenting hazards to health 10/15/2015  . Pneumonia    hx  . Shortness of breath   . Stroke [facility])    Past Surgical History:  Procedure Laterality Date  . ABDOMINAL HYSTERECTOMY    . ANTERIOR CERVICAL DECOMP/DISCECTOMY FUSION N/A 10/19/2013   Procedure: CERVICAL FIVE-SIX ANTERIOR CERVICAL DECOMPRESSION WITH FUSION INTERBODY PROSTHESIS PLATING AND PEEK CAGE;  Surgeon: Ophelia Charter, MD;  Location: [place] [facility];  Service: Neurosurgery;  Laterality: N/A;  . BACK SURGERY  80's  . BREAST CYST EXCISION Left    left negative   . CARDIAC CATHETERIZATION  05/2004  . CATARACT EXTRACTION Left   . CHOLECYSTECTOMY    .  ENDARTERECTOMY Left 10/24/2014   Procedure: ENDARTERECTOMY CAROTID;  Surgeon: Algernon Huxley, MD;  Location: [facility] ORS;  Service: Vascular;  Laterality: Left;  . ENDOBRONCHIAL ULTRASOUND N/A 11/14/2015   Procedure: ENDOBRONCHIAL ULTRASOUND;  Surgeon: [name], MD;  Location: [facility] ORS;  Service: Cardiopulmonary;  Laterality: N/A;  . PERIPHERAL VASCULAR CATHETERIZATION N/A 12/04/2015   Procedure: Glori Luis Cath Insertion;  Surgeon: Algernon Huxley, MD;  Location: [place] CV LAB;  Service: Cardiovascular;  Laterality: N/A;  . PORTA CATH REMOVAL N/A 05/17/2017   Procedure: PORTA CATH REMOVAL;  Surgeon: Algernon Huxley, MD;  Location: [place] [facility];  Service: Cardiovascular;  Laterality: N/A;  . TONSILLECTOMY AND ADENOIDECTOMY    . VESICOVAGINAL FISTULA CLOSURE W/ TAH     Family History  Problem Relation Age of Onset  . Stroke Mother        Massive  . Heart attack Father        Massive  . Heart attack Brother   . Heart attack Brother   . Lung cancer Maternal Grandfather   . Heart attack Paternal Grandmother        MI   Social History   Socioeconomic History  . Marital status: Married    Spouse name: Not on file  . Number of children: 4  . Years of education: Not on file  . Highest education level: 11th grade  Occupational History  . Occupation: Retired, Licensed conveyancer: RETIRED  Social Needs  . Financial resource strain: Somewhat hard  . Food insecurity:    Worry: Never true    Inability: Never true  . Transportation needs:    Medical: No    Non-medical: No  Tobacco Use  . Smoking status: Former Smoker    Packs/day: 1.00    Years: 50.00    Pack years: 50.00    Types: Cigarettes    Last attempt to quit: 10/03/2015    Years since quitting: 2.6  . Smokeless tobacco: Never Used  . Tobacco comment: smokes 3 cigs daily 05/06/15. Pt instructed to quit.  Substance and Sexual Activity  . Alcohol use: No    Alcohol/week: 0.0 standard drinks  . Drug use: No  . Sexual  activity: Never  Lifestyle  . Physical activity:    Days per week: 0 days    Minutes per session: 0 min  . Stress: Not at all  Relationships  . Social connections:    Talks on phone: Patient refused    Gets together: Patient refused    Attends religious service: Patient refused    Active member of club or organization: Patient refused    Attends meetings of clubs or organizations: Patient refused    Relationship status: Patient refused  Other Topics Concern  . Not on  file  Social History Narrative   Married with 4 children   Gets regular exercise   Lives at home with her husband.  Ambulates with a cane.    Outpatient Encounter Medications as of 06/07/2018  Medication Sig  . amLODipine (NORVASC) 5 MG tablet Take 1 tablet (5 mg total) by mouth daily.  Marland Kitchen aspirin EC 81 MG EC tablet Take 1 tablet (81 mg total) by mouth daily.  Marland Kitchen atorvastatin (LIPITOR) 40 MG tablet Take 1 tablet (40 mg total) by mouth daily at 6 PM.  . Blood Glucose Monitoring Suppl (ONE TOUCH ULTRA 2) w/Device KIT 1 each by Does not apply route See admin instructions.  . budesonide-formoterol (SYMBICORT) 160-4.5 MCG/ACT inhaler Inhale 2 puffs into the lungs 2 (two) times daily.  . clopidogrel (PLAVIX) 75 MG tablet Take 1 tablet (75 mg total) by mouth daily.  . fluticasone (FLONASE) 50 MCG/ACT nasal spray Place 2 sprays into both nostrils daily.  Marland Kitchen gabapentin (NEURONTIN) 600 MG tablet Take 0.5 tablets (300 mg total) by mouth 2 (two) times daily.  Marland Kitchen glimepiride (AMARYL) 2 MG tablet Take 1 tablet (2 mg total) by mouth 2 (two) times daily.  . isosorbide mononitrate (IMDUR) 30 MG 24 hr tablet TAKE 1 TABLET BY MOUTH  DAILY  . LORazepam (ATIVAN) 1 MG tablet Take 1 tablet (1 mg total) by mouth 2 (two) times daily as needed for anxiety.  . magnesium oxide (MAG-OX) 400 (241.3 MG) MG tablet Take 400 mg by mouth 2 (two) times daily.   . metoprolol tartrate (LOPRESSOR) 25 MG tablet Take 1 tablet (25 mg total) by mouth 2 (two) times  daily.  . Multiple Vitamins-Minerals (ICAPS) CAPS Take 1 capsule by mouth 2 (two) times daily.   . nitroGLYCERIN (NITROSTAT) 0.4 MG SL tablet Place 1 tablet (0.4 mg total) under the tongue every 5 (five) minutes as needed for chest pain.  . ONE TOUCH ULTRA TEST test strip USE TO CHECK BLOOD SUGAR ONCE DAILY  . pantoprazole (PROTONIX) 40 MG tablet Take 1 tablet (40 mg total) by mouth daily.   Facility-Administered Encounter Medications as of 06/07/2018  Medication  . ondansetron (ZOFRAN) 8 mg, dexamethasone (DECADRON) 10 mg in sodium chloride 0.9 % 50 mL IVPB    Activities of Daily Living In your present state of health, do you have any difficulty performing the following activities: 06/07/2018 05/06/2018  Hearing? Y N  Comment Just ordered bilateral hearing aids.  -  Vision? Y N  Comment Due to blindness in right eye. Wears eye glasses daily.  -  Difficulty concentrating or making decisions? Y N  Walking or climbing stairs? Y Y  Comment Due to SOB an leg pains.  -  Dressing or bathing? N N  Doing errands, shopping? Y N  Comment Does not drive.  -  Preparing Food and eating ? Y -  Comment Does minimal cooking. -  Using the Toilet? N -  In the past six months, have you accidently leaked urine? N -  Do you have problems with loss of bowel control? N -  Managing your Medications? Y -  Comment Husband manages medications.  -  Managing your Finances? Y -  Comment Husband manages finances. -  Housekeeping or managing your Housekeeping? Y -  Comment Stepdaughter does cleaning.  -  Some recent data might be hidden    Patient Care Team: Jerrol Banana., MD as PCP - General (Unknown Physician Specialty) [name], MD as Consulting Physician (Cardiology)  Algernon Huxley, MD as Consulting Physician (Vascular Surgery) Pa, [place] as Consulting Physician (Optometry) [name], Kathlene November, MD as Consulting Physician (Oncology) [name], MD as Referring Physician  (Radiation Oncology) Murlean Iba, MD as Consulting Physician (Internal Medicine)    Assessment:   This is a routine wellness examination for [name] [name].  Exercise Activities and Dietary recommendations Current Exercise Habits: The patient does not participate in regular exercise at present, Exercise limited by: orthopedic condition(s);respiratory conditions(s);cardiac condition(s)  Goals    . DIET - INCREASE WATER INTAKE     Recommend to drink at least 6-8 8oz glasses of water per day.    . Prevent falls     Recommend several different ways to avoid falls and how to prep home to prevent falls.       Fall Risk Fall Risk  06/07/2018 06/09/2017 05/31/2017 12/29/2016 07/22/2016  Falls in the past year? 1 Yes Yes Yes Yes  Number falls in past yr: 1 2 or more 2 or more 2 or more 1  Injury with Fall? 0 - No No Yes  Comment - - - - shouklder pain/no fracture  Risk Factor Category  - High Fall Risk High Fall Risk - -  Risk for fall due to : Impaired mobility;Impaired balance/gait - Impaired balance/gait - Impaired mobility  Risk for fall due to: Comment - - - - uses walker  Follow up Falls prevention discussed - Falls prevention discussed Falls evaluation completed;Falls prevention discussed Education provided   FALL RISK PREVENTION PERTAINING TO THE HOME: Any stairs in or around the home? Yes  If so, do they handrails? Yes   Home free of loose throw rugs in walkways, pet beds, electrical cords, etc? Yes  Adequate lighting in your home to reduce risk of falls? Yes   ASSISTIVE DEVICES UTILIZED TO PREVENT FALLS:  Life alert? No  Use of a cane, walker or w/c? Yes  Grab bars in the bathroom? No  Shower chair or bench in shower? Yes  Elevated toilet seat or a handicapped toilet? No    TIMED UP AND GO:  Was the test performed? No .    Depression Screen PHQ 2/9 Scores 06/07/2018 06/07/2018 05/31/2017 07/22/2016  PHQ - 2 Score 0 0 0 0  PHQ- 9 Score 4 - - -     Cognitive Function: Declined  today.         Immunization History  Administered Date(s) Administered  . Influenza Split 01/17/2010, 01/05/2012, 02/29/2012  . Influenza Whole 12/05/2009, 12/10/2010  . Influenza, High Dose Seasonal PF 02/14/2014, 01/01/2015, 12/29/2016, 01/25/2018  . Influenza,inj,Quad PF,6+ Mos 01/04/2013, 02/27/2013, 03/19/2016  . Influenza-Unspecified 02/04/2014  . Pneumococcal Conjugate-13 02/29/2012  . Pneumococcal Polysaccharide-23 01/05/2008, 01/05/2012  . Tdap 01/05/2012    Qualifies for Shingles Vaccine? Yes . Due for Shingrix. Education has been provided regarding the importance of this vaccine. Pt has been advised to call insurance company to determine out of pocket expense. Advised may also receive vaccine at local pharmacy or Health Dept. Verbalized acceptance and understanding.  Tdap: Up to date  Flu Vaccine: Up to date  Pneumococcal Vaccine: Up to date  Screening Tests Health Maintenance  Topic Date Due  . URINE MICROALBUMIN  12/29/2017  . FOOT EXAM  05/24/2018  . OPHTHALMOLOGY EXAM  07/07/2018  . HEMOGLOBIN A1C  11/05/2018  . TETANUS/TDAP  01/04/2022  . DEXA SCAN  01/20/2022  . INFLUENZA VACCINE  Completed  . PNA vac Low Risk Adult  Completed  Cancer Screenings:  Colorectal Screening: No longer required.   Mammogram: No longer required.   Bone Density: Completed 01/21/12. Results reflect NORMAL. Repeat every 10 years.   Lung Cancer Screening: (Low Dose CT Chest recommended if Age 28-80 years, 30 pack-year currently smoking OR have quit w/in 15years.) does not qualify.    Additional Screening:  Vision Screening: Recommended annual ophthalmology exams for early detection of glaucoma and other disorders of the eye.  Dental Screening: Recommended annual dental exams for proper oral hygiene  Community Resource Referral:  CRR required this visit?  No      Plan:  I have personally reviewed and addressed the Medicare Annual Wellness questionnaire and have noted  the following in the patient's chart:  A. Medical and social history B. Use of alcohol, tobacco or illicit drugs  C. Current medications and supplements D. Functional ability and status E.  Nutritional status F.  Physical activity G. Advance directives H. List of other physicians I.  Hospitalizations, surgeries, and ER visits in previous 12 months J.  [place] such as hearing and vision if needed, cognitive and depression L. Referrals and appointments - none  In addition, I have reviewed and discussed with patient certain preventive protocols, quality metrics, and best practice recommendations. A written personalized care plan for preventive services as well as general preventive health recommendations were provided to patient.  See attached scanned questionnaire for additional information.   Signed,  Fabio Neighbors, LPN Nurse Health Advisor   Nurse Recommendations: Pt needs a diabetic foot exam and urine check at next OV.

## 2018-06-08 ENCOUNTER — Ambulatory Visit
Admission: RE | Admit: 2018-06-08 | Discharge: 2018-06-08 | Disposition: A | Discharge status: Home / Self Care | Payer: Medicare Other | Source: Ambulatory Visit | Attending: Radiation Oncology | Admitting: Radiation Oncology

## 2018-06-08 ENCOUNTER — Other Ambulatory Visit: Payer: Self-pay

## 2018-06-08 ENCOUNTER — Encounter: Payer: Self-pay | Admitting: Radiation Oncology

## 2018-06-08 VITALS — BP 126/60 | HR 63 | Temp 98.1°F | Resp 18 | Wt 151.8 lb

## 2018-06-08 DIAGNOSIS — C3411 Malignant neoplasm of upper lobe, right bronchus or lung: Secondary | ICD-10-CM

## 2018-06-08 DIAGNOSIS — Z923 Personal history of irradiation: Secondary | ICD-10-CM | POA: Insufficient documentation

## 2018-06-08 DIAGNOSIS — F17210 Nicotine dependence, cigarettes, uncomplicated: Secondary | ICD-10-CM | POA: Insufficient documentation

## 2018-06-08 DIAGNOSIS — Z85118 Personal history of other malignant neoplasm of bronchus and lung: Secondary | ICD-10-CM | POA: Diagnosis not present

## 2018-06-08 DIAGNOSIS — Z9221 Personal history of antineoplastic chemotherapy: Secondary | ICD-10-CM | POA: Diagnosis not present

## 2018-06-08 DIAGNOSIS — Z8673 Personal history of transient ischemic attack (TIA), and cerebral infarction without residual deficits: Secondary | ICD-10-CM | POA: Diagnosis not present

## 2018-06-08 NOTE — Progress Notes (Signed)
Radiation Oncology Follow up Note  Name: [name] [name]   Date:   06/08/2018 MRN:  [id number] DOB: February 25, 1941    This 78 y.o. female presents2-1/2 year follow-up status post concurrent chemoradiation therapy and PCI for limited stage small cell lung cancer   REFERRING PROVIDER: Jerrol Banana.,*  HPI: patient is a 78 year old female now out 2-1/2 years having completed concurrent chemoradiation therapy as well as PCI for limited stage small cell lung cancer. She is seen today in routine follow-up is doing fairly well. She's recent had a CVA.with MRI showing right ophthalmic subcentimeter meter infarct. She also has widespread intracranial atherosclerotic changes.last CT scan of the chest was back in September showing grossly unchanged focal area of consolidation within the superior segment of the right lower lobe. She scheduled for follow-up CT scan this month. She specifically denies cough hemoptysis or chest tightness.  COMPLICATIONS OF TREATMENT: none  FOLLOW UP COMPLIANCE: keeps appointments   PHYSICAL EXAM:  BP 126/60 (BP Location: Left Arm, Patient Position: Sitting)   Pulse 63   Temp 98.1 F (36.7 C) (Tympanic)   Resp 18   Wt 151 lb 12.6 oz (68.9 kg)   BMI 26.05 kg/m  Ell-developed wheelchair-bound female in NAD.Well-developed well-nourished patient in NAD. HEENT reveals PERLA, EOMI, discs not visualized.  Oral cavity is clear. No oral mucosal lesions are identified. Neck is clear without evidence of cervical or supraclavicular adenopathy. Lungs are clear to A&P. Cardiac examination is essentially unremarkable with regular rate and rhythm without murmur rub or thrill. Abdomen is benign with no organomegaly or masses noted. Motor sensory and DTR levels are equal and symmetric in the upper and lower extremities. Cranial nerves II through XII are grossly intact. Proprioception is intact. No peripheral adenopathy or edema is identified. No motor or sensory levels are noted. Crude  visual fields are within normal range.  RADIOLOGY RESULTS: head scans as well as chest CT scan reviewed and compatible with the above-stated findings  PLAN: present time patient is doing well with no evidence of progressive disease. Based on her recent CVA and difficulty with transportation I will turn follow-up care over to medical oncology. I would be happy to reevaluate the patient any time should further treatment be indicated.  I would like to take this opportunity to thank you for allowing me to participate in the care of your patient.[name], MD

## 2018-06-13 ENCOUNTER — Telehealth: Payer: Self-pay | Admitting: Family Medicine

## 2018-06-13 DIAGNOSIS — E785 Hyperlipidemia, unspecified: Secondary | ICD-10-CM | POA: Diagnosis not present

## 2018-06-13 DIAGNOSIS — J449 Chronic obstructive pulmonary disease, unspecified: Secondary | ICD-10-CM | POA: Diagnosis not present

## 2018-06-13 DIAGNOSIS — M109 Gout, unspecified: Secondary | ICD-10-CM | POA: Diagnosis not present

## 2018-06-13 DIAGNOSIS — H3530 Unspecified macular degeneration: Secondary | ICD-10-CM | POA: Diagnosis not present

## 2018-06-13 DIAGNOSIS — I25119 Atherosclerotic heart disease of native coronary artery with unspecified angina pectoris: Secondary | ICD-10-CM | POA: Diagnosis not present

## 2018-06-13 DIAGNOSIS — E039 Hypothyroidism, unspecified: Secondary | ICD-10-CM | POA: Diagnosis not present

## 2018-06-13 DIAGNOSIS — Z85118 Personal history of other malignant neoplasm of bronchus and lung: Secondary | ICD-10-CM | POA: Diagnosis not present

## 2018-06-13 DIAGNOSIS — Z7984 Long term (current) use of oral hypoglycemic drugs: Secondary | ICD-10-CM | POA: Diagnosis not present

## 2018-06-13 DIAGNOSIS — I69354 Hemiplegia and hemiparesis following cerebral infarction affecting left non-dominant side: Secondary | ICD-10-CM | POA: Diagnosis not present

## 2018-06-13 DIAGNOSIS — I10 Essential (primary) hypertension: Secondary | ICD-10-CM | POA: Diagnosis not present

## 2018-06-13 DIAGNOSIS — Z9981 Dependence on supplemental oxygen: Secondary | ICD-10-CM | POA: Diagnosis not present

## 2018-06-13 DIAGNOSIS — E440 Moderate protein-calorie malnutrition: Secondary | ICD-10-CM | POA: Diagnosis not present

## 2018-06-13 DIAGNOSIS — E1142 Type 2 diabetes mellitus with diabetic polyneuropathy: Secondary | ICD-10-CM | POA: Diagnosis not present

## 2018-06-13 DIAGNOSIS — M4722 Other spondylosis with radiculopathy, cervical region: Secondary | ICD-10-CM | POA: Diagnosis not present

## 2018-06-13 NOTE — Telephone Encounter (Signed)
FYI

## 2018-06-13 NOTE — Telephone Encounter (Signed)
They are starting skilled nursing services today  teri

## 2018-06-13 NOTE — Telephone Encounter (Signed)
Advised  ED 

## 2018-06-13 NOTE — Telephone Encounter (Signed)
Hilda Blades, RN w/ [facility] - [phone]  Just admitted her to [facility].  Nursing Frequency: 2 times a week for 2 weeks 1 time a week for 7 weeks  Bath aid: 1 time a week for 1 week 2 times a week for 7 weeks 1 time a week for 1 week  Please advise.  Thanks, [employer]

## 2018-06-14 DIAGNOSIS — I69354 Hemiplegia and hemiparesis following cerebral infarction affecting left non-dominant side: Secondary | ICD-10-CM | POA: Diagnosis not present

## 2018-06-14 DIAGNOSIS — M109 Gout, unspecified: Secondary | ICD-10-CM | POA: Diagnosis not present

## 2018-06-14 DIAGNOSIS — J449 Chronic obstructive pulmonary disease, unspecified: Secondary | ICD-10-CM | POA: Diagnosis not present

## 2018-06-14 DIAGNOSIS — E1142 Type 2 diabetes mellitus with diabetic polyneuropathy: Secondary | ICD-10-CM | POA: Diagnosis not present

## 2018-06-14 DIAGNOSIS — E785 Hyperlipidemia, unspecified: Secondary | ICD-10-CM | POA: Diagnosis not present

## 2018-06-14 DIAGNOSIS — H353221 Exudative age-related macular degeneration, left eye, with active choroidal neovascularization: Secondary | ICD-10-CM | POA: Diagnosis not present

## 2018-06-14 DIAGNOSIS — Z85118 Personal history of other malignant neoplasm of bronchus and lung: Secondary | ICD-10-CM | POA: Diagnosis not present

## 2018-06-14 DIAGNOSIS — I25119 Atherosclerotic heart disease of native coronary artery with unspecified angina pectoris: Secondary | ICD-10-CM | POA: Diagnosis not present

## 2018-06-14 DIAGNOSIS — Z9981 Dependence on supplemental oxygen: Secondary | ICD-10-CM | POA: Diagnosis not present

## 2018-06-14 DIAGNOSIS — E039 Hypothyroidism, unspecified: Secondary | ICD-10-CM | POA: Diagnosis not present

## 2018-06-14 DIAGNOSIS — H3530 Unspecified macular degeneration: Secondary | ICD-10-CM | POA: Diagnosis not present

## 2018-06-14 DIAGNOSIS — I10 Essential (primary) hypertension: Secondary | ICD-10-CM | POA: Diagnosis not present

## 2018-06-14 DIAGNOSIS — E440 Moderate protein-calorie malnutrition: Secondary | ICD-10-CM | POA: Diagnosis not present

## 2018-06-14 DIAGNOSIS — Z7984 Long term (current) use of oral hypoglycemic drugs: Secondary | ICD-10-CM | POA: Diagnosis not present

## 2018-06-14 DIAGNOSIS — M4722 Other spondylosis with radiculopathy, cervical region: Secondary | ICD-10-CM | POA: Diagnosis not present

## 2018-06-15 ENCOUNTER — Other Ambulatory Visit: Payer: Self-pay | Admitting: *Deleted

## 2018-06-15 DIAGNOSIS — E039 Hypothyroidism, unspecified: Secondary | ICD-10-CM | POA: Diagnosis not present

## 2018-06-15 DIAGNOSIS — I10 Essential (primary) hypertension: Secondary | ICD-10-CM | POA: Diagnosis not present

## 2018-06-15 DIAGNOSIS — I69354 Hemiplegia and hemiparesis following cerebral infarction affecting left non-dominant side: Secondary | ICD-10-CM | POA: Diagnosis not present

## 2018-06-15 DIAGNOSIS — J449 Chronic obstructive pulmonary disease, unspecified: Secondary | ICD-10-CM | POA: Diagnosis not present

## 2018-06-15 DIAGNOSIS — M4722 Other spondylosis with radiculopathy, cervical region: Secondary | ICD-10-CM | POA: Diagnosis not present

## 2018-06-15 DIAGNOSIS — E1142 Type 2 diabetes mellitus with diabetic polyneuropathy: Secondary | ICD-10-CM | POA: Diagnosis not present

## 2018-06-15 DIAGNOSIS — Z7984 Long term (current) use of oral hypoglycemic drugs: Secondary | ICD-10-CM | POA: Diagnosis not present

## 2018-06-15 DIAGNOSIS — M109 Gout, unspecified: Secondary | ICD-10-CM | POA: Diagnosis not present

## 2018-06-15 DIAGNOSIS — E785 Hyperlipidemia, unspecified: Secondary | ICD-10-CM | POA: Diagnosis not present

## 2018-06-15 DIAGNOSIS — I25119 Atherosclerotic heart disease of native coronary artery with unspecified angina pectoris: Secondary | ICD-10-CM | POA: Diagnosis not present

## 2018-06-15 DIAGNOSIS — E440 Moderate protein-calorie malnutrition: Secondary | ICD-10-CM | POA: Diagnosis not present

## 2018-06-15 DIAGNOSIS — C3411 Malignant neoplasm of upper lobe, right bronchus or lung: Secondary | ICD-10-CM

## 2018-06-15 DIAGNOSIS — Z9981 Dependence on supplemental oxygen: Secondary | ICD-10-CM | POA: Diagnosis not present

## 2018-06-15 DIAGNOSIS — Z85118 Personal history of other malignant neoplasm of bronchus and lung: Secondary | ICD-10-CM | POA: Diagnosis not present

## 2018-06-15 DIAGNOSIS — H3530 Unspecified macular degeneration: Secondary | ICD-10-CM | POA: Diagnosis not present

## 2018-06-16 DIAGNOSIS — E785 Hyperlipidemia, unspecified: Secondary | ICD-10-CM | POA: Diagnosis not present

## 2018-06-16 DIAGNOSIS — Z85118 Personal history of other malignant neoplasm of bronchus and lung: Secondary | ICD-10-CM | POA: Diagnosis not present

## 2018-06-16 DIAGNOSIS — I10 Essential (primary) hypertension: Secondary | ICD-10-CM | POA: Diagnosis not present

## 2018-06-16 DIAGNOSIS — H3530 Unspecified macular degeneration: Secondary | ICD-10-CM | POA: Diagnosis not present

## 2018-06-16 DIAGNOSIS — Z9981 Dependence on supplemental oxygen: Secondary | ICD-10-CM | POA: Diagnosis not present

## 2018-06-16 DIAGNOSIS — E1142 Type 2 diabetes mellitus with diabetic polyneuropathy: Secondary | ICD-10-CM | POA: Diagnosis not present

## 2018-06-16 DIAGNOSIS — Z7984 Long term (current) use of oral hypoglycemic drugs: Secondary | ICD-10-CM | POA: Diagnosis not present

## 2018-06-16 DIAGNOSIS — M4722 Other spondylosis with radiculopathy, cervical region: Secondary | ICD-10-CM | POA: Diagnosis not present

## 2018-06-16 DIAGNOSIS — E039 Hypothyroidism, unspecified: Secondary | ICD-10-CM | POA: Diagnosis not present

## 2018-06-16 DIAGNOSIS — I69354 Hemiplegia and hemiparesis following cerebral infarction affecting left non-dominant side: Secondary | ICD-10-CM | POA: Diagnosis not present

## 2018-06-16 DIAGNOSIS — M109 Gout, unspecified: Secondary | ICD-10-CM | POA: Diagnosis not present

## 2018-06-16 DIAGNOSIS — J449 Chronic obstructive pulmonary disease, unspecified: Secondary | ICD-10-CM | POA: Diagnosis not present

## 2018-06-16 DIAGNOSIS — E440 Moderate protein-calorie malnutrition: Secondary | ICD-10-CM | POA: Diagnosis not present

## 2018-06-16 DIAGNOSIS — I25119 Atherosclerotic heart disease of native coronary artery with unspecified angina pectoris: Secondary | ICD-10-CM | POA: Diagnosis not present

## 2018-06-17 ENCOUNTER — Other Ambulatory Visit: Payer: Self-pay

## 2018-06-17 ENCOUNTER — Inpatient Hospital Stay: Payer: Medicare Other | Attending: Oncology

## 2018-06-17 ENCOUNTER — Ambulatory Visit: Admission: RE | Admit: 2018-06-17 | Payer: Medicare Other | Source: Ambulatory Visit

## 2018-06-17 ENCOUNTER — Ambulatory Visit
Admission: RE | Admit: 2018-06-17 | Discharge: 2018-06-17 | Disposition: A | Discharge status: Home / Self Care | Payer: Medicare Other | Source: Ambulatory Visit | Attending: Oncology | Admitting: Oncology

## 2018-06-17 DIAGNOSIS — J449 Chronic obstructive pulmonary disease, unspecified: Secondary | ICD-10-CM | POA: Diagnosis not present

## 2018-06-17 DIAGNOSIS — M4722 Other spondylosis with radiculopathy, cervical region: Secondary | ICD-10-CM | POA: Diagnosis not present

## 2018-06-17 DIAGNOSIS — C3411 Malignant neoplasm of upper lobe, right bronchus or lung: Secondary | ICD-10-CM

## 2018-06-17 DIAGNOSIS — Z85118 Personal history of other malignant neoplasm of bronchus and lung: Secondary | ICD-10-CM | POA: Diagnosis not present

## 2018-06-17 DIAGNOSIS — E440 Moderate protein-calorie malnutrition: Secondary | ICD-10-CM | POA: Diagnosis not present

## 2018-06-17 DIAGNOSIS — I10 Essential (primary) hypertension: Secondary | ICD-10-CM | POA: Diagnosis not present

## 2018-06-17 DIAGNOSIS — E039 Hypothyroidism, unspecified: Secondary | ICD-10-CM | POA: Diagnosis not present

## 2018-06-17 DIAGNOSIS — E785 Hyperlipidemia, unspecified: Secondary | ICD-10-CM | POA: Diagnosis not present

## 2018-06-17 DIAGNOSIS — I25119 Atherosclerotic heart disease of native coronary artery with unspecified angina pectoris: Secondary | ICD-10-CM | POA: Diagnosis not present

## 2018-06-17 DIAGNOSIS — J432 Centrilobular emphysema: Secondary | ICD-10-CM | POA: Diagnosis not present

## 2018-06-17 DIAGNOSIS — E1142 Type 2 diabetes mellitus with diabetic polyneuropathy: Secondary | ICD-10-CM | POA: Diagnosis not present

## 2018-06-17 DIAGNOSIS — M109 Gout, unspecified: Secondary | ICD-10-CM | POA: Diagnosis not present

## 2018-06-17 DIAGNOSIS — I69354 Hemiplegia and hemiparesis following cerebral infarction affecting left non-dominant side: Secondary | ICD-10-CM | POA: Diagnosis not present

## 2018-06-17 DIAGNOSIS — Z9981 Dependence on supplemental oxygen: Secondary | ICD-10-CM | POA: Diagnosis not present

## 2018-06-17 DIAGNOSIS — H3530 Unspecified macular degeneration: Secondary | ICD-10-CM | POA: Diagnosis not present

## 2018-06-17 DIAGNOSIS — Z7984 Long term (current) use of oral hypoglycemic drugs: Secondary | ICD-10-CM | POA: Diagnosis not present

## 2018-06-17 LAB — CREATININE, SERUM
Creatinine, Ser: 1.48 mg/dL — ABNORMAL HIGH (ref 0.44–1.00)
GFR calc Af Amer: 39 mL/min — ABNORMAL LOW (ref >60)
GFR calc non Af Amer: 34 mL/min — ABNORMAL LOW (ref >60)

## 2018-06-17 MED ORDER — IOHEXOL 300 MG/ML  SOLN
60.0000 mL | Freq: Once | INTRAMUSCULAR | Status: AC | PRN
  Administered 2018-06-17: 60 mL via INTRAVENOUS

## 2018-06-20 ENCOUNTER — Ambulatory Visit: Payer: Medicare Other | Admitting: Oncology

## 2018-06-20 ENCOUNTER — Telehealth: Payer: Self-pay | Admitting: *Deleted

## 2018-06-20 ENCOUNTER — Inpatient Hospital Stay: Payer: Medicare Other | Admitting: Oncology

## 2018-06-20 ENCOUNTER — Other Ambulatory Visit: Payer: Self-pay | Admitting: *Deleted

## 2018-06-20 DIAGNOSIS — J449 Chronic obstructive pulmonary disease, unspecified: Secondary | ICD-10-CM | POA: Diagnosis not present

## 2018-06-20 DIAGNOSIS — M4722 Other spondylosis with radiculopathy, cervical region: Secondary | ICD-10-CM | POA: Diagnosis not present

## 2018-06-20 DIAGNOSIS — H3530 Unspecified macular degeneration: Secondary | ICD-10-CM | POA: Diagnosis not present

## 2018-06-20 DIAGNOSIS — E785 Hyperlipidemia, unspecified: Secondary | ICD-10-CM | POA: Diagnosis not present

## 2018-06-20 DIAGNOSIS — Z85118 Personal history of other malignant neoplasm of bronchus and lung: Secondary | ICD-10-CM | POA: Diagnosis not present

## 2018-06-20 DIAGNOSIS — E440 Moderate protein-calorie malnutrition: Secondary | ICD-10-CM | POA: Diagnosis not present

## 2018-06-20 DIAGNOSIS — Z9981 Dependence on supplemental oxygen: Secondary | ICD-10-CM | POA: Diagnosis not present

## 2018-06-20 DIAGNOSIS — I69354 Hemiplegia and hemiparesis following cerebral infarction affecting left non-dominant side: Secondary | ICD-10-CM | POA: Diagnosis not present

## 2018-06-20 DIAGNOSIS — I10 Essential (primary) hypertension: Secondary | ICD-10-CM | POA: Diagnosis not present

## 2018-06-20 DIAGNOSIS — Z7984 Long term (current) use of oral hypoglycemic drugs: Secondary | ICD-10-CM | POA: Diagnosis not present

## 2018-06-20 DIAGNOSIS — E039 Hypothyroidism, unspecified: Secondary | ICD-10-CM | POA: Diagnosis not present

## 2018-06-20 DIAGNOSIS — C3411 Malignant neoplasm of upper lobe, right bronchus or lung: Secondary | ICD-10-CM

## 2018-06-20 DIAGNOSIS — E1142 Type 2 diabetes mellitus with diabetic polyneuropathy: Secondary | ICD-10-CM | POA: Diagnosis not present

## 2018-06-20 DIAGNOSIS — M109 Gout, unspecified: Secondary | ICD-10-CM | POA: Diagnosis not present

## 2018-06-20 DIAGNOSIS — I25119 Atherosclerotic heart disease of native coronary artery with unspecified angina pectoris: Secondary | ICD-10-CM | POA: Diagnosis not present

## 2018-06-20 NOTE — Telephone Encounter (Signed)
Pt aware of CT results, patient to have CT scan and f/u scheduled in 6 months. Orders entered and message sent to scheduling.

## 2018-06-21 DIAGNOSIS — E785 Hyperlipidemia, unspecified: Secondary | ICD-10-CM | POA: Diagnosis not present

## 2018-06-21 DIAGNOSIS — E1142 Type 2 diabetes mellitus with diabetic polyneuropathy: Secondary | ICD-10-CM | POA: Diagnosis not present

## 2018-06-21 DIAGNOSIS — I10 Essential (primary) hypertension: Secondary | ICD-10-CM | POA: Diagnosis not present

## 2018-06-21 DIAGNOSIS — E039 Hypothyroidism, unspecified: Secondary | ICD-10-CM | POA: Diagnosis not present

## 2018-06-21 DIAGNOSIS — Z7984 Long term (current) use of oral hypoglycemic drugs: Secondary | ICD-10-CM | POA: Diagnosis not present

## 2018-06-21 DIAGNOSIS — E440 Moderate protein-calorie malnutrition: Secondary | ICD-10-CM | POA: Diagnosis not present

## 2018-06-21 DIAGNOSIS — Z85118 Personal history of other malignant neoplasm of bronchus and lung: Secondary | ICD-10-CM | POA: Diagnosis not present

## 2018-06-21 DIAGNOSIS — I69354 Hemiplegia and hemiparesis following cerebral infarction affecting left non-dominant side: Secondary | ICD-10-CM | POA: Diagnosis not present

## 2018-06-21 DIAGNOSIS — I25119 Atherosclerotic heart disease of native coronary artery with unspecified angina pectoris: Secondary | ICD-10-CM | POA: Diagnosis not present

## 2018-06-21 DIAGNOSIS — J449 Chronic obstructive pulmonary disease, unspecified: Secondary | ICD-10-CM | POA: Diagnosis not present

## 2018-06-21 DIAGNOSIS — H3530 Unspecified macular degeneration: Secondary | ICD-10-CM | POA: Diagnosis not present

## 2018-06-21 DIAGNOSIS — M4722 Other spondylosis with radiculopathy, cervical region: Secondary | ICD-10-CM | POA: Diagnosis not present

## 2018-06-21 DIAGNOSIS — M109 Gout, unspecified: Secondary | ICD-10-CM | POA: Diagnosis not present

## 2018-06-21 DIAGNOSIS — Z9981 Dependence on supplemental oxygen: Secondary | ICD-10-CM | POA: Diagnosis not present

## 2018-06-22 DIAGNOSIS — E1142 Type 2 diabetes mellitus with diabetic polyneuropathy: Secondary | ICD-10-CM | POA: Diagnosis not present

## 2018-06-22 DIAGNOSIS — Z7984 Long term (current) use of oral hypoglycemic drugs: Secondary | ICD-10-CM | POA: Diagnosis not present

## 2018-06-22 DIAGNOSIS — E785 Hyperlipidemia, unspecified: Secondary | ICD-10-CM | POA: Diagnosis not present

## 2018-06-22 DIAGNOSIS — I10 Essential (primary) hypertension: Secondary | ICD-10-CM | POA: Diagnosis not present

## 2018-06-22 DIAGNOSIS — H3530 Unspecified macular degeneration: Secondary | ICD-10-CM | POA: Diagnosis not present

## 2018-06-22 DIAGNOSIS — M109 Gout, unspecified: Secondary | ICD-10-CM | POA: Diagnosis not present

## 2018-06-22 DIAGNOSIS — Z9981 Dependence on supplemental oxygen: Secondary | ICD-10-CM | POA: Diagnosis not present

## 2018-06-22 DIAGNOSIS — M4722 Other spondylosis with radiculopathy, cervical region: Secondary | ICD-10-CM | POA: Diagnosis not present

## 2018-06-22 DIAGNOSIS — Z85118 Personal history of other malignant neoplasm of bronchus and lung: Secondary | ICD-10-CM | POA: Diagnosis not present

## 2018-06-22 DIAGNOSIS — E039 Hypothyroidism, unspecified: Secondary | ICD-10-CM | POA: Diagnosis not present

## 2018-06-22 DIAGNOSIS — E440 Moderate protein-calorie malnutrition: Secondary | ICD-10-CM | POA: Diagnosis not present

## 2018-06-22 DIAGNOSIS — J449 Chronic obstructive pulmonary disease, unspecified: Secondary | ICD-10-CM | POA: Diagnosis not present

## 2018-06-22 DIAGNOSIS — I69354 Hemiplegia and hemiparesis following cerebral infarction affecting left non-dominant side: Secondary | ICD-10-CM | POA: Diagnosis not present

## 2018-06-22 DIAGNOSIS — I25119 Atherosclerotic heart disease of native coronary artery with unspecified angina pectoris: Secondary | ICD-10-CM | POA: Diagnosis not present

## 2018-06-24 DIAGNOSIS — M109 Gout, unspecified: Secondary | ICD-10-CM | POA: Diagnosis not present

## 2018-06-24 DIAGNOSIS — Z9981 Dependence on supplemental oxygen: Secondary | ICD-10-CM | POA: Diagnosis not present

## 2018-06-24 DIAGNOSIS — Z85118 Personal history of other malignant neoplasm of bronchus and lung: Secondary | ICD-10-CM | POA: Diagnosis not present

## 2018-06-24 DIAGNOSIS — H3530 Unspecified macular degeneration: Secondary | ICD-10-CM | POA: Diagnosis not present

## 2018-06-24 DIAGNOSIS — M4722 Other spondylosis with radiculopathy, cervical region: Secondary | ICD-10-CM | POA: Diagnosis not present

## 2018-06-24 DIAGNOSIS — J449 Chronic obstructive pulmonary disease, unspecified: Secondary | ICD-10-CM | POA: Diagnosis not present

## 2018-06-24 DIAGNOSIS — E039 Hypothyroidism, unspecified: Secondary | ICD-10-CM | POA: Diagnosis not present

## 2018-06-24 DIAGNOSIS — I25119 Atherosclerotic heart disease of native coronary artery with unspecified angina pectoris: Secondary | ICD-10-CM | POA: Diagnosis not present

## 2018-06-24 DIAGNOSIS — R0602 Shortness of breath: Secondary | ICD-10-CM | POA: Diagnosis not present

## 2018-06-24 DIAGNOSIS — E440 Moderate protein-calorie malnutrition: Secondary | ICD-10-CM | POA: Diagnosis not present

## 2018-06-24 DIAGNOSIS — E1142 Type 2 diabetes mellitus with diabetic polyneuropathy: Secondary | ICD-10-CM | POA: Diagnosis not present

## 2018-06-24 DIAGNOSIS — I69354 Hemiplegia and hemiparesis following cerebral infarction affecting left non-dominant side: Secondary | ICD-10-CM | POA: Diagnosis not present

## 2018-06-24 DIAGNOSIS — Z7984 Long term (current) use of oral hypoglycemic drugs: Secondary | ICD-10-CM | POA: Diagnosis not present

## 2018-06-24 DIAGNOSIS — E785 Hyperlipidemia, unspecified: Secondary | ICD-10-CM | POA: Diagnosis not present

## 2018-06-24 DIAGNOSIS — I10 Essential (primary) hypertension: Secondary | ICD-10-CM | POA: Diagnosis not present

## 2018-06-26 DIAGNOSIS — J449 Chronic obstructive pulmonary disease, unspecified: Secondary | ICD-10-CM | POA: Diagnosis not present

## 2018-06-27 DIAGNOSIS — E440 Moderate protein-calorie malnutrition: Secondary | ICD-10-CM | POA: Diagnosis not present

## 2018-06-27 DIAGNOSIS — E1142 Type 2 diabetes mellitus with diabetic polyneuropathy: Secondary | ICD-10-CM | POA: Diagnosis not present

## 2018-06-27 DIAGNOSIS — E039 Hypothyroidism, unspecified: Secondary | ICD-10-CM | POA: Diagnosis not present

## 2018-06-27 DIAGNOSIS — Z85118 Personal history of other malignant neoplasm of bronchus and lung: Secondary | ICD-10-CM | POA: Diagnosis not present

## 2018-06-27 DIAGNOSIS — J449 Chronic obstructive pulmonary disease, unspecified: Secondary | ICD-10-CM | POA: Diagnosis not present

## 2018-06-27 DIAGNOSIS — H3530 Unspecified macular degeneration: Secondary | ICD-10-CM | POA: Diagnosis not present

## 2018-06-27 DIAGNOSIS — I25119 Atherosclerotic heart disease of native coronary artery with unspecified angina pectoris: Secondary | ICD-10-CM | POA: Diagnosis not present

## 2018-06-27 DIAGNOSIS — E785 Hyperlipidemia, unspecified: Secondary | ICD-10-CM | POA: Diagnosis not present

## 2018-06-27 DIAGNOSIS — M109 Gout, unspecified: Secondary | ICD-10-CM | POA: Diagnosis not present

## 2018-06-27 DIAGNOSIS — I10 Essential (primary) hypertension: Secondary | ICD-10-CM | POA: Diagnosis not present

## 2018-06-27 DIAGNOSIS — Z7984 Long term (current) use of oral hypoglycemic drugs: Secondary | ICD-10-CM | POA: Diagnosis not present

## 2018-06-27 DIAGNOSIS — I69354 Hemiplegia and hemiparesis following cerebral infarction affecting left non-dominant side: Secondary | ICD-10-CM | POA: Diagnosis not present

## 2018-06-27 DIAGNOSIS — Z9981 Dependence on supplemental oxygen: Secondary | ICD-10-CM | POA: Diagnosis not present

## 2018-06-27 DIAGNOSIS — M4722 Other spondylosis with radiculopathy, cervical region: Secondary | ICD-10-CM | POA: Diagnosis not present

## 2018-06-28 DIAGNOSIS — I10 Essential (primary) hypertension: Secondary | ICD-10-CM | POA: Diagnosis not present

## 2018-06-28 DIAGNOSIS — J449 Chronic obstructive pulmonary disease, unspecified: Secondary | ICD-10-CM | POA: Diagnosis not present

## 2018-06-28 DIAGNOSIS — Z7984 Long term (current) use of oral hypoglycemic drugs: Secondary | ICD-10-CM | POA: Diagnosis not present

## 2018-06-28 DIAGNOSIS — H3530 Unspecified macular degeneration: Secondary | ICD-10-CM | POA: Diagnosis not present

## 2018-06-28 DIAGNOSIS — E1142 Type 2 diabetes mellitus with diabetic polyneuropathy: Secondary | ICD-10-CM | POA: Diagnosis not present

## 2018-06-28 DIAGNOSIS — E440 Moderate protein-calorie malnutrition: Secondary | ICD-10-CM | POA: Diagnosis not present

## 2018-06-28 DIAGNOSIS — E785 Hyperlipidemia, unspecified: Secondary | ICD-10-CM | POA: Diagnosis not present

## 2018-06-28 DIAGNOSIS — E039 Hypothyroidism, unspecified: Secondary | ICD-10-CM | POA: Diagnosis not present

## 2018-06-28 DIAGNOSIS — M4722 Other spondylosis with radiculopathy, cervical region: Secondary | ICD-10-CM | POA: Diagnosis not present

## 2018-06-28 DIAGNOSIS — Z85118 Personal history of other malignant neoplasm of bronchus and lung: Secondary | ICD-10-CM | POA: Diagnosis not present

## 2018-06-28 DIAGNOSIS — Z9981 Dependence on supplemental oxygen: Secondary | ICD-10-CM | POA: Diagnosis not present

## 2018-06-28 DIAGNOSIS — M109 Gout, unspecified: Secondary | ICD-10-CM | POA: Diagnosis not present

## 2018-06-28 DIAGNOSIS — I69354 Hemiplegia and hemiparesis following cerebral infarction affecting left non-dominant side: Secondary | ICD-10-CM | POA: Diagnosis not present

## 2018-06-28 DIAGNOSIS — I25119 Atherosclerotic heart disease of native coronary artery with unspecified angina pectoris: Secondary | ICD-10-CM | POA: Diagnosis not present

## 2018-06-29 DIAGNOSIS — E440 Moderate protein-calorie malnutrition: Secondary | ICD-10-CM | POA: Diagnosis not present

## 2018-06-29 DIAGNOSIS — E1142 Type 2 diabetes mellitus with diabetic polyneuropathy: Secondary | ICD-10-CM | POA: Diagnosis not present

## 2018-06-29 DIAGNOSIS — Z7984 Long term (current) use of oral hypoglycemic drugs: Secondary | ICD-10-CM | POA: Diagnosis not present

## 2018-06-29 DIAGNOSIS — M109 Gout, unspecified: Secondary | ICD-10-CM | POA: Diagnosis not present

## 2018-06-29 DIAGNOSIS — Z85118 Personal history of other malignant neoplasm of bronchus and lung: Secondary | ICD-10-CM | POA: Diagnosis not present

## 2018-06-29 DIAGNOSIS — I69354 Hemiplegia and hemiparesis following cerebral infarction affecting left non-dominant side: Secondary | ICD-10-CM | POA: Diagnosis not present

## 2018-06-29 DIAGNOSIS — I10 Essential (primary) hypertension: Secondary | ICD-10-CM | POA: Diagnosis not present

## 2018-06-29 DIAGNOSIS — M4722 Other spondylosis with radiculopathy, cervical region: Secondary | ICD-10-CM | POA: Diagnosis not present

## 2018-06-29 DIAGNOSIS — E785 Hyperlipidemia, unspecified: Secondary | ICD-10-CM | POA: Diagnosis not present

## 2018-06-29 DIAGNOSIS — J449 Chronic obstructive pulmonary disease, unspecified: Secondary | ICD-10-CM | POA: Diagnosis not present

## 2018-06-29 DIAGNOSIS — I25119 Atherosclerotic heart disease of native coronary artery with unspecified angina pectoris: Secondary | ICD-10-CM | POA: Diagnosis not present

## 2018-06-29 DIAGNOSIS — E039 Hypothyroidism, unspecified: Secondary | ICD-10-CM | POA: Diagnosis not present

## 2018-06-29 DIAGNOSIS — Z9981 Dependence on supplemental oxygen: Secondary | ICD-10-CM | POA: Diagnosis not present

## 2018-06-29 DIAGNOSIS — H3530 Unspecified macular degeneration: Secondary | ICD-10-CM | POA: Diagnosis not present

## 2018-07-04 DIAGNOSIS — E440 Moderate protein-calorie malnutrition: Secondary | ICD-10-CM

## 2018-07-04 DIAGNOSIS — Z7984 Long term (current) use of oral hypoglycemic drugs: Secondary | ICD-10-CM | POA: Diagnosis not present

## 2018-07-04 DIAGNOSIS — I25119 Atherosclerotic heart disease of native coronary artery with unspecified angina pectoris: Secondary | ICD-10-CM

## 2018-07-04 DIAGNOSIS — J449 Chronic obstructive pulmonary disease, unspecified: Secondary | ICD-10-CM | POA: Diagnosis not present

## 2018-07-04 DIAGNOSIS — Z9981 Dependence on supplemental oxygen: Secondary | ICD-10-CM | POA: Diagnosis not present

## 2018-07-04 DIAGNOSIS — M109 Gout, unspecified: Secondary | ICD-10-CM | POA: Diagnosis not present

## 2018-07-04 DIAGNOSIS — I10 Essential (primary) hypertension: Secondary | ICD-10-CM | POA: Diagnosis not present

## 2018-07-04 DIAGNOSIS — E039 Hypothyroidism, unspecified: Secondary | ICD-10-CM | POA: Diagnosis not present

## 2018-07-04 DIAGNOSIS — M4722 Other spondylosis with radiculopathy, cervical region: Secondary | ICD-10-CM | POA: Diagnosis not present

## 2018-07-04 DIAGNOSIS — H3530 Unspecified macular degeneration: Secondary | ICD-10-CM

## 2018-07-04 DIAGNOSIS — I69354 Hemiplegia and hemiparesis following cerebral infarction affecting left non-dominant side: Secondary | ICD-10-CM | POA: Diagnosis not present

## 2018-07-04 DIAGNOSIS — E785 Hyperlipidemia, unspecified: Secondary | ICD-10-CM

## 2018-07-04 DIAGNOSIS — E1142 Type 2 diabetes mellitus with diabetic polyneuropathy: Secondary | ICD-10-CM | POA: Diagnosis not present

## 2018-07-04 DIAGNOSIS — Z85118 Personal history of other malignant neoplasm of bronchus and lung: Secondary | ICD-10-CM | POA: Diagnosis not present

## 2018-07-05 DIAGNOSIS — E440 Moderate protein-calorie malnutrition: Secondary | ICD-10-CM | POA: Diagnosis not present

## 2018-07-05 DIAGNOSIS — E785 Hyperlipidemia, unspecified: Secondary | ICD-10-CM | POA: Diagnosis not present

## 2018-07-05 DIAGNOSIS — E1142 Type 2 diabetes mellitus with diabetic polyneuropathy: Secondary | ICD-10-CM | POA: Diagnosis not present

## 2018-07-05 DIAGNOSIS — M4722 Other spondylosis with radiculopathy, cervical region: Secondary | ICD-10-CM | POA: Diagnosis not present

## 2018-07-05 DIAGNOSIS — H3530 Unspecified macular degeneration: Secondary | ICD-10-CM | POA: Diagnosis not present

## 2018-07-05 DIAGNOSIS — E039 Hypothyroidism, unspecified: Secondary | ICD-10-CM | POA: Diagnosis not present

## 2018-07-05 DIAGNOSIS — I69354 Hemiplegia and hemiparesis following cerebral infarction affecting left non-dominant side: Secondary | ICD-10-CM | POA: Diagnosis not present

## 2018-07-05 DIAGNOSIS — Z85118 Personal history of other malignant neoplasm of bronchus and lung: Secondary | ICD-10-CM | POA: Diagnosis not present

## 2018-07-05 DIAGNOSIS — I25119 Atherosclerotic heart disease of native coronary artery with unspecified angina pectoris: Secondary | ICD-10-CM | POA: Diagnosis not present

## 2018-07-05 DIAGNOSIS — Z9981 Dependence on supplemental oxygen: Secondary | ICD-10-CM | POA: Diagnosis not present

## 2018-07-05 DIAGNOSIS — J449 Chronic obstructive pulmonary disease, unspecified: Secondary | ICD-10-CM | POA: Diagnosis not present

## 2018-07-05 DIAGNOSIS — Z7984 Long term (current) use of oral hypoglycemic drugs: Secondary | ICD-10-CM | POA: Diagnosis not present

## 2018-07-05 DIAGNOSIS — I10 Essential (primary) hypertension: Secondary | ICD-10-CM | POA: Diagnosis not present

## 2018-07-05 DIAGNOSIS — M109 Gout, unspecified: Secondary | ICD-10-CM | POA: Diagnosis not present

## 2018-07-06 ENCOUNTER — Telehealth: Payer: Self-pay

## 2018-07-06 DIAGNOSIS — Z7984 Long term (current) use of oral hypoglycemic drugs: Secondary | ICD-10-CM | POA: Diagnosis not present

## 2018-07-06 DIAGNOSIS — E785 Hyperlipidemia, unspecified: Secondary | ICD-10-CM | POA: Diagnosis not present

## 2018-07-06 DIAGNOSIS — E1142 Type 2 diabetes mellitus with diabetic polyneuropathy: Secondary | ICD-10-CM | POA: Diagnosis not present

## 2018-07-06 DIAGNOSIS — E440 Moderate protein-calorie malnutrition: Secondary | ICD-10-CM | POA: Diagnosis not present

## 2018-07-06 DIAGNOSIS — Z85118 Personal history of other malignant neoplasm of bronchus and lung: Secondary | ICD-10-CM | POA: Diagnosis not present

## 2018-07-06 DIAGNOSIS — M109 Gout, unspecified: Secondary | ICD-10-CM | POA: Diagnosis not present

## 2018-07-06 DIAGNOSIS — I10 Essential (primary) hypertension: Secondary | ICD-10-CM | POA: Diagnosis not present

## 2018-07-06 DIAGNOSIS — E039 Hypothyroidism, unspecified: Secondary | ICD-10-CM | POA: Diagnosis not present

## 2018-07-06 DIAGNOSIS — H3530 Unspecified macular degeneration: Secondary | ICD-10-CM | POA: Diagnosis not present

## 2018-07-06 DIAGNOSIS — I25119 Atherosclerotic heart disease of native coronary artery with unspecified angina pectoris: Secondary | ICD-10-CM | POA: Diagnosis not present

## 2018-07-06 DIAGNOSIS — I69354 Hemiplegia and hemiparesis following cerebral infarction affecting left non-dominant side: Secondary | ICD-10-CM | POA: Diagnosis not present

## 2018-07-06 DIAGNOSIS — M4722 Other spondylosis with radiculopathy, cervical region: Secondary | ICD-10-CM | POA: Diagnosis not present

## 2018-07-06 DIAGNOSIS — J449 Chronic obstructive pulmonary disease, unspecified: Secondary | ICD-10-CM | POA: Diagnosis not present

## 2018-07-06 DIAGNOSIS — Z9981 Dependence on supplemental oxygen: Secondary | ICD-10-CM | POA: Diagnosis not present

## 2018-07-06 NOTE — Telephone Encounter (Signed)
Advised patient as below.  

## 2018-07-06 NOTE — Therapy (Signed)
[place] [facility] SERVICES [address] [place], [place], [address] Phone: [phone]   Fax:  [phone]  July 06, 2018   '@CCLISTADDRESS' @  Physical Therapy Discharge Summary  Dates of reporting period  10/13/18   to   05/02/18    Patient: [name] [name]  MRN: [id number]  Date of Birth: 1940/07/25   Referring Provider (PT): Jerrol Banana, MD   The above patient had been seen in Physical Therapy 44 times treatments  The treatment consisted of strength and balance training The patient is: Improved   Discharge Findings: At the time of discharge pt was making progress with therapy. Her balance confidence had improved on the ABC. Her TUG and BERG improved as well. She had partially met most of her goals. Upon arrival for last physical therapy session pt reported new onset L sided weakness which started 2 days prior. Daughter reported that she first noticed the symptoms on Wednesday morning 05/04/18 when she arrived at her mother's house. Pt complaining of worsening balance and difficulty with transfers. In addition daughter reports that pt had mildly slurred speech as well as mild confusion/worsening memory. She was found to have mild L shoulder abduction weakness compared to R side as well as L grip weakness. There was a notable decline in her mobility from previous therapy sessions. Pt and daughter were taken to the [facility] ED where pt was found to have a new CVA. Pt has not returned for additional PT services and has been discharged from outpatient PT.   Goals Partially Met    PT Long Term Goals - 05/02/18 1447      PT LONG TERM GOAL #1   Title  Pt's ABC scale will improve to at least 50% to demonstrate decreased perception of imbalance    Baseline  20%; 8/15: 45%; 9/26: 57.5%; 01/24/18: 55%; 02/28/18: 25.63%, 03/31/18=31%; 05/02/18: 40%    Time  8    Period  Weeks    Status  On-going    Target Date  05/23/18      PT LONG TERM GOAL #2    Title  Pt's TUG time (with SPC) will decrease to less than or equal to 20 seconds to demonstrate improved mobility    Baseline  32 seconds; 8/15: 31 seconds; 9/26: 18.09 sec; 01/24/18: 21.1s; 02/28/18: 18.7s; 05/02/18: 18.1s    Time  8    Period  Weeks    Status  Achieved      PT LONG TERM GOAL #3   Title  Pt's Berg Balance Test will improve to at least 42/56 to demonstrate decreased risk of falling    Baseline  23/56; 8/15: 33/56; 9/26: 40/56; 01/24/18: 48/56; 02/28/18: 41/56; 05/02/18: 44/56;    Time  8    Period  Weeks    Status  Achieved      PT LONG TERM GOAL #4   Title  Pt's 66mT (with SPC) will improve to at least 0.6 m/s to demonstrate improved gait speed in the home in community    Baseline  0.26 m/s; 8/15: .42 m/s with AD; 9/26: 0.54 m/s; 01/24/18: self-selected: 35.0s = 0.29 m/s, fastest: 21.7s=0.46 m/s; 02/28/18: self-selected: 32.0s = 0.31 m/s, fastest: 16.8s=0.60 m/s; self-selected: 26.4s=0.38 m/s, Fastest: 19.4s= 0.52 m/s    Time  8    Period  Weeks    Status  On-going    Target Date  05/23/18      PT LONG TERM GOAL #5   Title  Pt will demonstrate ability to perform sit<>stand x1 without using UE support for improved QOL    Baseline  Pt requires at least 1UE support; 01/24/18: Able to perform without UE support; 05/02/18: Able to perform    Time  4    Period  Weeks    Status  Achieved      PT LONG TERM GOAL #6   Title  Pt will improve BERG by at least 3 points (51/56) in order to demonstrate clinically significant improvement in balance.      Baseline  23/56; 8/15: 33/56; 9/26: 40/56; 01/24/18: 48/56; 02/28/18: 41/56: 03/31/18=41/56; 05/02/18: 44/56    Time  6    Period  Weeks    Status  On-going    Target Date  05/23/18           Phillips Grout PT, DPT, [place] MAIN [facility] SERVICES [address] [place], [place], [address] Phone: [phone]   Fax:  [phone]  Patient: [name] [name]  MRN: [id number]  Date  of Birth: 11-26-1940

## 2018-07-06 NOTE — Telephone Encounter (Signed)
Mr.sipes calling that his wife sugar is 460 and that she is not feeling well but "everything else" looks alright. Reports that he doesn't want to go to the ED and is requesting Dr.Gilbert  To send something in for her. Reports that he doesn't want to go to the ED because he doesn't want to expose her. Please advise.

## 2018-07-06 NOTE — Telephone Encounter (Signed)
Double Amaryl as discussed as first option.

## 2018-07-07 ENCOUNTER — Telehealth: Payer: Self-pay

## 2018-07-07 DIAGNOSIS — Z7984 Long term (current) use of oral hypoglycemic drugs: Secondary | ICD-10-CM | POA: Diagnosis not present

## 2018-07-07 DIAGNOSIS — I10 Essential (primary) hypertension: Secondary | ICD-10-CM | POA: Diagnosis not present

## 2018-07-07 DIAGNOSIS — M109 Gout, unspecified: Secondary | ICD-10-CM | POA: Diagnosis not present

## 2018-07-07 DIAGNOSIS — J449 Chronic obstructive pulmonary disease, unspecified: Secondary | ICD-10-CM | POA: Diagnosis not present

## 2018-07-07 DIAGNOSIS — I25119 Atherosclerotic heart disease of native coronary artery with unspecified angina pectoris: Secondary | ICD-10-CM | POA: Diagnosis not present

## 2018-07-07 DIAGNOSIS — I69354 Hemiplegia and hemiparesis following cerebral infarction affecting left non-dominant side: Secondary | ICD-10-CM | POA: Diagnosis not present

## 2018-07-07 DIAGNOSIS — E039 Hypothyroidism, unspecified: Secondary | ICD-10-CM | POA: Diagnosis not present

## 2018-07-07 DIAGNOSIS — Z9981 Dependence on supplemental oxygen: Secondary | ICD-10-CM | POA: Diagnosis not present

## 2018-07-07 DIAGNOSIS — E785 Hyperlipidemia, unspecified: Secondary | ICD-10-CM | POA: Diagnosis not present

## 2018-07-07 DIAGNOSIS — Z85118 Personal history of other malignant neoplasm of bronchus and lung: Secondary | ICD-10-CM | POA: Diagnosis not present

## 2018-07-07 DIAGNOSIS — M4722 Other spondylosis with radiculopathy, cervical region: Secondary | ICD-10-CM | POA: Diagnosis not present

## 2018-07-07 DIAGNOSIS — E1142 Type 2 diabetes mellitus with diabetic polyneuropathy: Secondary | ICD-10-CM | POA: Diagnosis not present

## 2018-07-07 DIAGNOSIS — E440 Moderate protein-calorie malnutrition: Secondary | ICD-10-CM | POA: Diagnosis not present

## 2018-07-07 DIAGNOSIS — H3530 Unspecified macular degeneration: Secondary | ICD-10-CM | POA: Diagnosis not present

## 2018-07-07 NOTE — Telephone Encounter (Signed)
The home health nurse has just checked the patient's glucose.  Random sugar today was 527.  Her glucose was elevated yesterday and she was instructed yesterday to increase her Glimeperide to 2 mg 2 BID.  Patient reported to the nurse that her fasting sugar today was about that high but she didn't remember the exact number. Pt has only taken the increased dose this morning.  She has not yet taken her meds this afternoon.  I have advised the nurse that I was sending you this message to you but she really hasn't had time for a the medicine to make a change.  Have asked the home health nurse to instruct the patient on what to do if she starts feeling bad or her level gets any worse.  Dorthea [place], [facility] nurse CB# [phone]

## 2018-07-08 MED ORDER — METFORMIN HCL 500 MG PO TABS: 500.0000 mg | ORAL_TABLET | Freq: Two times a day (BID) | ORAL | 3 refills | Status: DC

## 2018-07-08 NOTE — Telephone Encounter (Signed)
Advised home health nurse and patient of starting metformin.  Pt was ok with it.  I sent in rx to pharmacy.  dbs

## 2018-07-08 NOTE — Telephone Encounter (Signed)
If she can would start metformin 500mg  BID--will monitor kidneys=metformin will not hurt kidneys but we must stop if GFR gets beow 30.

## 2018-07-11 ENCOUNTER — Telehealth: Payer: Self-pay | Admitting: *Deleted

## 2018-07-11 NOTE — Telephone Encounter (Signed)
Patient's husband [name] [name] called stating patient has been throwing up, has a runny nose, and feels really bad. [name] [name] would like advise on what he should do? Please advise?

## 2018-07-12 NOTE — Telephone Encounter (Signed)
Spoke with husband yesterday and patient was beginning to feel better. Encouraged to push fluids and rest. Recommended the BRAT diet for now until nausea gets better. He reports that she has been taking Zofran, which is starting to be effective. Denies fever. If worsening, it was recommended that she go to the ER for possible IV fluids. Call us for an update tomorrow. Patient husband verbalized understanding.

## 2018-07-13 DIAGNOSIS — M109 Gout, unspecified: Secondary | ICD-10-CM | POA: Diagnosis not present

## 2018-07-13 DIAGNOSIS — I69354 Hemiplegia and hemiparesis following cerebral infarction affecting left non-dominant side: Secondary | ICD-10-CM | POA: Diagnosis not present

## 2018-07-13 DIAGNOSIS — I25119 Atherosclerotic heart disease of native coronary artery with unspecified angina pectoris: Secondary | ICD-10-CM | POA: Diagnosis not present

## 2018-07-13 DIAGNOSIS — Z9981 Dependence on supplemental oxygen: Secondary | ICD-10-CM | POA: Diagnosis not present

## 2018-07-13 DIAGNOSIS — H3530 Unspecified macular degeneration: Secondary | ICD-10-CM | POA: Diagnosis not present

## 2018-07-13 DIAGNOSIS — E1142 Type 2 diabetes mellitus with diabetic polyneuropathy: Secondary | ICD-10-CM | POA: Diagnosis not present

## 2018-07-13 DIAGNOSIS — E039 Hypothyroidism, unspecified: Secondary | ICD-10-CM | POA: Diagnosis not present

## 2018-07-13 DIAGNOSIS — Z7984 Long term (current) use of oral hypoglycemic drugs: Secondary | ICD-10-CM | POA: Diagnosis not present

## 2018-07-13 DIAGNOSIS — I10 Essential (primary) hypertension: Secondary | ICD-10-CM | POA: Diagnosis not present

## 2018-07-13 DIAGNOSIS — J449 Chronic obstructive pulmonary disease, unspecified: Secondary | ICD-10-CM | POA: Diagnosis not present

## 2018-07-13 DIAGNOSIS — Z85118 Personal history of other malignant neoplasm of bronchus and lung: Secondary | ICD-10-CM | POA: Diagnosis not present

## 2018-07-13 DIAGNOSIS — E440 Moderate protein-calorie malnutrition: Secondary | ICD-10-CM | POA: Diagnosis not present

## 2018-07-13 DIAGNOSIS — E785 Hyperlipidemia, unspecified: Secondary | ICD-10-CM | POA: Diagnosis not present

## 2018-07-13 DIAGNOSIS — M4722 Other spondylosis with radiculopathy, cervical region: Secondary | ICD-10-CM | POA: Diagnosis not present

## 2018-07-14 ENCOUNTER — Telehealth: Payer: Self-pay

## 2018-07-14 DIAGNOSIS — J449 Chronic obstructive pulmonary disease, unspecified: Secondary | ICD-10-CM | POA: Diagnosis not present

## 2018-07-14 DIAGNOSIS — M109 Gout, unspecified: Secondary | ICD-10-CM | POA: Diagnosis not present

## 2018-07-14 DIAGNOSIS — Z9981 Dependence on supplemental oxygen: Secondary | ICD-10-CM | POA: Diagnosis not present

## 2018-07-14 DIAGNOSIS — Z7984 Long term (current) use of oral hypoglycemic drugs: Secondary | ICD-10-CM | POA: Diagnosis not present

## 2018-07-14 DIAGNOSIS — E1142 Type 2 diabetes mellitus with diabetic polyneuropathy: Secondary | ICD-10-CM | POA: Diagnosis not present

## 2018-07-14 DIAGNOSIS — I10 Essential (primary) hypertension: Secondary | ICD-10-CM | POA: Diagnosis not present

## 2018-07-14 DIAGNOSIS — E440 Moderate protein-calorie malnutrition: Secondary | ICD-10-CM | POA: Diagnosis not present

## 2018-07-14 DIAGNOSIS — E785 Hyperlipidemia, unspecified: Secondary | ICD-10-CM | POA: Diagnosis not present

## 2018-07-14 DIAGNOSIS — Z85118 Personal history of other malignant neoplasm of bronchus and lung: Secondary | ICD-10-CM | POA: Diagnosis not present

## 2018-07-14 DIAGNOSIS — I69354 Hemiplegia and hemiparesis following cerebral infarction affecting left non-dominant side: Secondary | ICD-10-CM | POA: Diagnosis not present

## 2018-07-14 DIAGNOSIS — I25119 Atherosclerotic heart disease of native coronary artery with unspecified angina pectoris: Secondary | ICD-10-CM | POA: Diagnosis not present

## 2018-07-14 DIAGNOSIS — E039 Hypothyroidism, unspecified: Secondary | ICD-10-CM | POA: Diagnosis not present

## 2018-07-14 DIAGNOSIS — H3530 Unspecified macular degeneration: Secondary | ICD-10-CM | POA: Diagnosis not present

## 2018-07-14 DIAGNOSIS — M4722 Other spondylosis with radiculopathy, cervical region: Secondary | ICD-10-CM | POA: Diagnosis not present

## 2018-07-14 NOTE — Telephone Encounter (Signed)
Please review

## 2018-07-14 NOTE — Telephone Encounter (Signed)
Kendric from [facility] called stating that patiens blood sugar was high above 500 she states that Dr. Rosanna Randy increased Glimpiride and increased metformin 500mg  to BID. She wanted the doctor to know that on Monday she d/c Metformin because patient complained of nausea and vomiting, patient has been compliant with taking Glimepiride and has been tolerating well.  She states that today when she went to visit patient blood sugar was at 349, patient denies any G.I upset, nausea, vomiting, polyuria, polydipsia. Kendric is asking that we increase frequency of nurse visits to 2x a week for the next 4 weeks.

## 2018-07-15 DIAGNOSIS — Z9981 Dependence on supplemental oxygen: Secondary | ICD-10-CM | POA: Diagnosis not present

## 2018-07-15 DIAGNOSIS — Z85118 Personal history of other malignant neoplasm of bronchus and lung: Secondary | ICD-10-CM | POA: Diagnosis not present

## 2018-07-15 DIAGNOSIS — E039 Hypothyroidism, unspecified: Secondary | ICD-10-CM | POA: Diagnosis not present

## 2018-07-15 DIAGNOSIS — M4722 Other spondylosis with radiculopathy, cervical region: Secondary | ICD-10-CM | POA: Diagnosis not present

## 2018-07-15 DIAGNOSIS — M109 Gout, unspecified: Secondary | ICD-10-CM | POA: Diagnosis not present

## 2018-07-15 DIAGNOSIS — I69354 Hemiplegia and hemiparesis following cerebral infarction affecting left non-dominant side: Secondary | ICD-10-CM | POA: Diagnosis not present

## 2018-07-15 DIAGNOSIS — I10 Essential (primary) hypertension: Secondary | ICD-10-CM | POA: Diagnosis not present

## 2018-07-15 DIAGNOSIS — E785 Hyperlipidemia, unspecified: Secondary | ICD-10-CM | POA: Diagnosis not present

## 2018-07-15 DIAGNOSIS — I25119 Atherosclerotic heart disease of native coronary artery with unspecified angina pectoris: Secondary | ICD-10-CM | POA: Diagnosis not present

## 2018-07-15 DIAGNOSIS — H3530 Unspecified macular degeneration: Secondary | ICD-10-CM | POA: Diagnosis not present

## 2018-07-15 DIAGNOSIS — J449 Chronic obstructive pulmonary disease, unspecified: Secondary | ICD-10-CM | POA: Diagnosis not present

## 2018-07-15 DIAGNOSIS — Z7984 Long term (current) use of oral hypoglycemic drugs: Secondary | ICD-10-CM | POA: Diagnosis not present

## 2018-07-15 DIAGNOSIS — E1142 Type 2 diabetes mellitus with diabetic polyneuropathy: Secondary | ICD-10-CM | POA: Diagnosis not present

## 2018-07-15 DIAGNOSIS — E440 Moderate protein-calorie malnutrition: Secondary | ICD-10-CM | POA: Diagnosis not present

## 2018-07-15 NOTE — Telephone Encounter (Signed)
ok 

## 2018-07-15 NOTE — Telephone Encounter (Signed)
[email or web address] notified.

## 2018-07-18 ENCOUNTER — Other Ambulatory Visit: Payer: Self-pay | Admitting: Family Medicine

## 2018-07-18 DIAGNOSIS — E785 Hyperlipidemia, unspecified: Secondary | ICD-10-CM | POA: Diagnosis not present

## 2018-07-18 DIAGNOSIS — M4722 Other spondylosis with radiculopathy, cervical region: Secondary | ICD-10-CM | POA: Diagnosis not present

## 2018-07-18 DIAGNOSIS — Z9981 Dependence on supplemental oxygen: Secondary | ICD-10-CM | POA: Diagnosis not present

## 2018-07-18 DIAGNOSIS — E782 Mixed hyperlipidemia: Secondary | ICD-10-CM

## 2018-07-18 DIAGNOSIS — M109 Gout, unspecified: Secondary | ICD-10-CM | POA: Diagnosis not present

## 2018-07-18 DIAGNOSIS — I10 Essential (primary) hypertension: Secondary | ICD-10-CM

## 2018-07-18 DIAGNOSIS — I69354 Hemiplegia and hemiparesis following cerebral infarction affecting left non-dominant side: Secondary | ICD-10-CM | POA: Diagnosis not present

## 2018-07-18 DIAGNOSIS — H3530 Unspecified macular degeneration: Secondary | ICD-10-CM | POA: Diagnosis not present

## 2018-07-18 DIAGNOSIS — Z85118 Personal history of other malignant neoplasm of bronchus and lung: Secondary | ICD-10-CM | POA: Diagnosis not present

## 2018-07-18 DIAGNOSIS — E1142 Type 2 diabetes mellitus with diabetic polyneuropathy: Secondary | ICD-10-CM | POA: Diagnosis not present

## 2018-07-18 DIAGNOSIS — E440 Moderate protein-calorie malnutrition: Secondary | ICD-10-CM | POA: Diagnosis not present

## 2018-07-18 DIAGNOSIS — E039 Hypothyroidism, unspecified: Secondary | ICD-10-CM | POA: Diagnosis not present

## 2018-07-18 DIAGNOSIS — Z7984 Long term (current) use of oral hypoglycemic drugs: Secondary | ICD-10-CM | POA: Diagnosis not present

## 2018-07-18 DIAGNOSIS — I25119 Atherosclerotic heart disease of native coronary artery with unspecified angina pectoris: Secondary | ICD-10-CM | POA: Diagnosis not present

## 2018-07-18 DIAGNOSIS — J449 Chronic obstructive pulmonary disease, unspecified: Secondary | ICD-10-CM | POA: Diagnosis not present

## 2018-07-18 NOTE — Telephone Encounter (Signed)
[facility] faxed refill request for the following medications:  1. atorvastatin (LIPITOR) 40 MG tablet  2. metFORMIN (GLUCOPHAGE) 500 MG tablet  3. clopidogrel (PLAVIX) 75 MG tablet 4. amLODipine (NORVASC) 5 MG tablet   90 day supply  Please advise. Thanks TNP

## 2018-07-19 MED ORDER — ATORVASTATIN CALCIUM 40 MG PO TABS: 40.0000 mg | ORAL_TABLET | Freq: Every day | ORAL | 3 refills | Status: DC

## 2018-07-19 MED ORDER — AMLODIPINE BESYLATE 5 MG PO TABS: 5.0000 mg | ORAL_TABLET | Freq: Every day | ORAL | 3 refills | Status: DC

## 2018-07-19 MED ORDER — METFORMIN HCL 500 MG PO TABS: 500.0000 mg | ORAL_TABLET | Freq: Two times a day (BID) | ORAL | 3 refills | Status: DC

## 2018-07-19 MED ORDER — CLOPIDOGREL BISULFATE 75 MG PO TABS: 75.0000 mg | ORAL_TABLET | Freq: Every day | ORAL | 3 refills | Status: DC

## 2018-07-20 DIAGNOSIS — E785 Hyperlipidemia, unspecified: Secondary | ICD-10-CM | POA: Diagnosis not present

## 2018-07-20 DIAGNOSIS — Z7984 Long term (current) use of oral hypoglycemic drugs: Secondary | ICD-10-CM | POA: Diagnosis not present

## 2018-07-20 DIAGNOSIS — M109 Gout, unspecified: Secondary | ICD-10-CM | POA: Diagnosis not present

## 2018-07-20 DIAGNOSIS — I25119 Atherosclerotic heart disease of native coronary artery with unspecified angina pectoris: Secondary | ICD-10-CM | POA: Diagnosis not present

## 2018-07-20 DIAGNOSIS — I69354 Hemiplegia and hemiparesis following cerebral infarction affecting left non-dominant side: Secondary | ICD-10-CM | POA: Diagnosis not present

## 2018-07-20 DIAGNOSIS — E039 Hypothyroidism, unspecified: Secondary | ICD-10-CM | POA: Diagnosis not present

## 2018-07-20 DIAGNOSIS — M4722 Other spondylosis with radiculopathy, cervical region: Secondary | ICD-10-CM | POA: Diagnosis not present

## 2018-07-20 DIAGNOSIS — E440 Moderate protein-calorie malnutrition: Secondary | ICD-10-CM | POA: Diagnosis not present

## 2018-07-20 DIAGNOSIS — Z9981 Dependence on supplemental oxygen: Secondary | ICD-10-CM | POA: Diagnosis not present

## 2018-07-20 DIAGNOSIS — J449 Chronic obstructive pulmonary disease, unspecified: Secondary | ICD-10-CM | POA: Diagnosis not present

## 2018-07-20 DIAGNOSIS — Z85118 Personal history of other malignant neoplasm of bronchus and lung: Secondary | ICD-10-CM | POA: Diagnosis not present

## 2018-07-20 DIAGNOSIS — H3530 Unspecified macular degeneration: Secondary | ICD-10-CM | POA: Diagnosis not present

## 2018-07-20 DIAGNOSIS — I10 Essential (primary) hypertension: Secondary | ICD-10-CM | POA: Diagnosis not present

## 2018-07-20 DIAGNOSIS — E1142 Type 2 diabetes mellitus with diabetic polyneuropathy: Secondary | ICD-10-CM | POA: Diagnosis not present

## 2018-07-22 DIAGNOSIS — M4722 Other spondylosis with radiculopathy, cervical region: Secondary | ICD-10-CM | POA: Diagnosis not present

## 2018-07-22 DIAGNOSIS — I10 Essential (primary) hypertension: Secondary | ICD-10-CM | POA: Diagnosis not present

## 2018-07-22 DIAGNOSIS — Z7984 Long term (current) use of oral hypoglycemic drugs: Secondary | ICD-10-CM | POA: Diagnosis not present

## 2018-07-22 DIAGNOSIS — M109 Gout, unspecified: Secondary | ICD-10-CM | POA: Diagnosis not present

## 2018-07-22 DIAGNOSIS — E440 Moderate protein-calorie malnutrition: Secondary | ICD-10-CM | POA: Diagnosis not present

## 2018-07-22 DIAGNOSIS — Z85118 Personal history of other malignant neoplasm of bronchus and lung: Secondary | ICD-10-CM | POA: Diagnosis not present

## 2018-07-22 DIAGNOSIS — E039 Hypothyroidism, unspecified: Secondary | ICD-10-CM | POA: Diagnosis not present

## 2018-07-22 DIAGNOSIS — I69354 Hemiplegia and hemiparesis following cerebral infarction affecting left non-dominant side: Secondary | ICD-10-CM | POA: Diagnosis not present

## 2018-07-22 DIAGNOSIS — J449 Chronic obstructive pulmonary disease, unspecified: Secondary | ICD-10-CM | POA: Diagnosis not present

## 2018-07-22 DIAGNOSIS — E785 Hyperlipidemia, unspecified: Secondary | ICD-10-CM | POA: Diagnosis not present

## 2018-07-22 DIAGNOSIS — I25119 Atherosclerotic heart disease of native coronary artery with unspecified angina pectoris: Secondary | ICD-10-CM | POA: Diagnosis not present

## 2018-07-22 DIAGNOSIS — Z9981 Dependence on supplemental oxygen: Secondary | ICD-10-CM | POA: Diagnosis not present

## 2018-07-22 DIAGNOSIS — E1142 Type 2 diabetes mellitus with diabetic polyneuropathy: Secondary | ICD-10-CM | POA: Diagnosis not present

## 2018-07-22 DIAGNOSIS — H3530 Unspecified macular degeneration: Secondary | ICD-10-CM | POA: Diagnosis not present

## 2018-07-25 DIAGNOSIS — E1142 Type 2 diabetes mellitus with diabetic polyneuropathy: Secondary | ICD-10-CM | POA: Diagnosis not present

## 2018-07-25 DIAGNOSIS — H3530 Unspecified macular degeneration: Secondary | ICD-10-CM | POA: Diagnosis not present

## 2018-07-25 DIAGNOSIS — I25119 Atherosclerotic heart disease of native coronary artery with unspecified angina pectoris: Secondary | ICD-10-CM | POA: Diagnosis not present

## 2018-07-25 DIAGNOSIS — Z85118 Personal history of other malignant neoplasm of bronchus and lung: Secondary | ICD-10-CM | POA: Diagnosis not present

## 2018-07-25 DIAGNOSIS — I10 Essential (primary) hypertension: Secondary | ICD-10-CM | POA: Diagnosis not present

## 2018-07-25 DIAGNOSIS — J449 Chronic obstructive pulmonary disease, unspecified: Secondary | ICD-10-CM | POA: Diagnosis not present

## 2018-07-25 DIAGNOSIS — M4722 Other spondylosis with radiculopathy, cervical region: Secondary | ICD-10-CM | POA: Diagnosis not present

## 2018-07-25 DIAGNOSIS — R0602 Shortness of breath: Secondary | ICD-10-CM | POA: Diagnosis not present

## 2018-07-25 DIAGNOSIS — E039 Hypothyroidism, unspecified: Secondary | ICD-10-CM | POA: Diagnosis not present

## 2018-07-25 DIAGNOSIS — Z9981 Dependence on supplemental oxygen: Secondary | ICD-10-CM | POA: Diagnosis not present

## 2018-07-25 DIAGNOSIS — M109 Gout, unspecified: Secondary | ICD-10-CM | POA: Diagnosis not present

## 2018-07-25 DIAGNOSIS — Z7984 Long term (current) use of oral hypoglycemic drugs: Secondary | ICD-10-CM | POA: Diagnosis not present

## 2018-07-25 DIAGNOSIS — E440 Moderate protein-calorie malnutrition: Secondary | ICD-10-CM | POA: Diagnosis not present

## 2018-07-25 DIAGNOSIS — E785 Hyperlipidemia, unspecified: Secondary | ICD-10-CM | POA: Diagnosis not present

## 2018-07-25 DIAGNOSIS — I69354 Hemiplegia and hemiparesis following cerebral infarction affecting left non-dominant side: Secondary | ICD-10-CM | POA: Diagnosis not present

## 2018-07-26 DIAGNOSIS — M109 Gout, unspecified: Secondary | ICD-10-CM | POA: Diagnosis not present

## 2018-07-26 DIAGNOSIS — E440 Moderate protein-calorie malnutrition: Secondary | ICD-10-CM | POA: Diagnosis not present

## 2018-07-26 DIAGNOSIS — M4722 Other spondylosis with radiculopathy, cervical region: Secondary | ICD-10-CM | POA: Diagnosis not present

## 2018-07-26 DIAGNOSIS — I10 Essential (primary) hypertension: Secondary | ICD-10-CM | POA: Diagnosis not present

## 2018-07-26 DIAGNOSIS — I69354 Hemiplegia and hemiparesis following cerebral infarction affecting left non-dominant side: Secondary | ICD-10-CM | POA: Diagnosis not present

## 2018-07-26 DIAGNOSIS — Z85118 Personal history of other malignant neoplasm of bronchus and lung: Secondary | ICD-10-CM | POA: Diagnosis not present

## 2018-07-26 DIAGNOSIS — E1142 Type 2 diabetes mellitus with diabetic polyneuropathy: Secondary | ICD-10-CM | POA: Diagnosis not present

## 2018-07-26 DIAGNOSIS — Z7984 Long term (current) use of oral hypoglycemic drugs: Secondary | ICD-10-CM | POA: Diagnosis not present

## 2018-07-26 DIAGNOSIS — J449 Chronic obstructive pulmonary disease, unspecified: Secondary | ICD-10-CM | POA: Diagnosis not present

## 2018-07-26 DIAGNOSIS — I25119 Atherosclerotic heart disease of native coronary artery with unspecified angina pectoris: Secondary | ICD-10-CM | POA: Diagnosis not present

## 2018-07-26 DIAGNOSIS — Z9981 Dependence on supplemental oxygen: Secondary | ICD-10-CM | POA: Diagnosis not present

## 2018-07-26 DIAGNOSIS — H3530 Unspecified macular degeneration: Secondary | ICD-10-CM | POA: Diagnosis not present

## 2018-07-26 DIAGNOSIS — E039 Hypothyroidism, unspecified: Secondary | ICD-10-CM | POA: Diagnosis not present

## 2018-07-26 DIAGNOSIS — E785 Hyperlipidemia, unspecified: Secondary | ICD-10-CM | POA: Diagnosis not present

## 2018-07-27 DIAGNOSIS — I69354 Hemiplegia and hemiparesis following cerebral infarction affecting left non-dominant side: Secondary | ICD-10-CM | POA: Diagnosis not present

## 2018-07-27 DIAGNOSIS — Z85118 Personal history of other malignant neoplasm of bronchus and lung: Secondary | ICD-10-CM | POA: Diagnosis not present

## 2018-07-27 DIAGNOSIS — E039 Hypothyroidism, unspecified: Secondary | ICD-10-CM | POA: Diagnosis not present

## 2018-07-27 DIAGNOSIS — E1142 Type 2 diabetes mellitus with diabetic polyneuropathy: Secondary | ICD-10-CM | POA: Diagnosis not present

## 2018-07-27 DIAGNOSIS — E440 Moderate protein-calorie malnutrition: Secondary | ICD-10-CM | POA: Diagnosis not present

## 2018-07-27 DIAGNOSIS — E785 Hyperlipidemia, unspecified: Secondary | ICD-10-CM | POA: Diagnosis not present

## 2018-07-27 DIAGNOSIS — J449 Chronic obstructive pulmonary disease, unspecified: Secondary | ICD-10-CM | POA: Diagnosis not present

## 2018-07-27 DIAGNOSIS — M4722 Other spondylosis with radiculopathy, cervical region: Secondary | ICD-10-CM | POA: Diagnosis not present

## 2018-07-27 DIAGNOSIS — Z9981 Dependence on supplemental oxygen: Secondary | ICD-10-CM | POA: Diagnosis not present

## 2018-07-27 DIAGNOSIS — I25119 Atherosclerotic heart disease of native coronary artery with unspecified angina pectoris: Secondary | ICD-10-CM | POA: Diagnosis not present

## 2018-07-27 DIAGNOSIS — H3530 Unspecified macular degeneration: Secondary | ICD-10-CM | POA: Diagnosis not present

## 2018-07-27 DIAGNOSIS — Z7984 Long term (current) use of oral hypoglycemic drugs: Secondary | ICD-10-CM | POA: Diagnosis not present

## 2018-07-27 DIAGNOSIS — M109 Gout, unspecified: Secondary | ICD-10-CM | POA: Diagnosis not present

## 2018-07-27 DIAGNOSIS — I10 Essential (primary) hypertension: Secondary | ICD-10-CM | POA: Diagnosis not present

## 2018-07-28 ENCOUNTER — Ambulatory Visit: Payer: Medicare Other | Admitting: Family Medicine

## 2018-07-29 DIAGNOSIS — J449 Chronic obstructive pulmonary disease, unspecified: Secondary | ICD-10-CM | POA: Diagnosis not present

## 2018-07-29 DIAGNOSIS — I10 Essential (primary) hypertension: Secondary | ICD-10-CM | POA: Diagnosis not present

## 2018-07-29 DIAGNOSIS — M4722 Other spondylosis with radiculopathy, cervical region: Secondary | ICD-10-CM | POA: Diagnosis not present

## 2018-07-29 DIAGNOSIS — Z7984 Long term (current) use of oral hypoglycemic drugs: Secondary | ICD-10-CM | POA: Diagnosis not present

## 2018-07-29 DIAGNOSIS — I25119 Atherosclerotic heart disease of native coronary artery with unspecified angina pectoris: Secondary | ICD-10-CM | POA: Diagnosis not present

## 2018-07-29 DIAGNOSIS — I69354 Hemiplegia and hemiparesis following cerebral infarction affecting left non-dominant side: Secondary | ICD-10-CM | POA: Diagnosis not present

## 2018-07-29 DIAGNOSIS — E1142 Type 2 diabetes mellitus with diabetic polyneuropathy: Secondary | ICD-10-CM | POA: Diagnosis not present

## 2018-07-29 DIAGNOSIS — E039 Hypothyroidism, unspecified: Secondary | ICD-10-CM | POA: Diagnosis not present

## 2018-07-29 DIAGNOSIS — Z85118 Personal history of other malignant neoplasm of bronchus and lung: Secondary | ICD-10-CM | POA: Diagnosis not present

## 2018-07-29 DIAGNOSIS — Z9981 Dependence on supplemental oxygen: Secondary | ICD-10-CM | POA: Diagnosis not present

## 2018-07-29 DIAGNOSIS — E785 Hyperlipidemia, unspecified: Secondary | ICD-10-CM | POA: Diagnosis not present

## 2018-07-29 DIAGNOSIS — H3530 Unspecified macular degeneration: Secondary | ICD-10-CM | POA: Diagnosis not present

## 2018-07-29 DIAGNOSIS — E440 Moderate protein-calorie malnutrition: Secondary | ICD-10-CM | POA: Diagnosis not present

## 2018-07-29 DIAGNOSIS — M109 Gout, unspecified: Secondary | ICD-10-CM | POA: Diagnosis not present

## 2018-08-01 DIAGNOSIS — E039 Hypothyroidism, unspecified: Secondary | ICD-10-CM | POA: Diagnosis not present

## 2018-08-01 DIAGNOSIS — M109 Gout, unspecified: Secondary | ICD-10-CM | POA: Diagnosis not present

## 2018-08-01 DIAGNOSIS — Z7984 Long term (current) use of oral hypoglycemic drugs: Secondary | ICD-10-CM | POA: Diagnosis not present

## 2018-08-01 DIAGNOSIS — I25119 Atherosclerotic heart disease of native coronary artery with unspecified angina pectoris: Secondary | ICD-10-CM | POA: Diagnosis not present

## 2018-08-01 DIAGNOSIS — Z9981 Dependence on supplemental oxygen: Secondary | ICD-10-CM | POA: Diagnosis not present

## 2018-08-01 DIAGNOSIS — I69354 Hemiplegia and hemiparesis following cerebral infarction affecting left non-dominant side: Secondary | ICD-10-CM | POA: Diagnosis not present

## 2018-08-01 DIAGNOSIS — M4722 Other spondylosis with radiculopathy, cervical region: Secondary | ICD-10-CM | POA: Diagnosis not present

## 2018-08-01 DIAGNOSIS — J449 Chronic obstructive pulmonary disease, unspecified: Secondary | ICD-10-CM | POA: Diagnosis not present

## 2018-08-01 DIAGNOSIS — Z85118 Personal history of other malignant neoplasm of bronchus and lung: Secondary | ICD-10-CM | POA: Diagnosis not present

## 2018-08-01 DIAGNOSIS — E440 Moderate protein-calorie malnutrition: Secondary | ICD-10-CM | POA: Diagnosis not present

## 2018-08-01 DIAGNOSIS — E785 Hyperlipidemia, unspecified: Secondary | ICD-10-CM | POA: Diagnosis not present

## 2018-08-01 DIAGNOSIS — E1142 Type 2 diabetes mellitus with diabetic polyneuropathy: Secondary | ICD-10-CM | POA: Diagnosis not present

## 2018-08-01 DIAGNOSIS — H3530 Unspecified macular degeneration: Secondary | ICD-10-CM | POA: Diagnosis not present

## 2018-08-01 DIAGNOSIS — I10 Essential (primary) hypertension: Secondary | ICD-10-CM | POA: Diagnosis not present

## 2018-08-02 ENCOUNTER — Telehealth: Payer: Self-pay

## 2018-08-02 DIAGNOSIS — M4722 Other spondylosis with radiculopathy, cervical region: Secondary | ICD-10-CM | POA: Diagnosis not present

## 2018-08-02 DIAGNOSIS — M109 Gout, unspecified: Secondary | ICD-10-CM | POA: Diagnosis not present

## 2018-08-02 DIAGNOSIS — I69354 Hemiplegia and hemiparesis following cerebral infarction affecting left non-dominant side: Secondary | ICD-10-CM | POA: Diagnosis not present

## 2018-08-02 DIAGNOSIS — E039 Hypothyroidism, unspecified: Secondary | ICD-10-CM | POA: Diagnosis not present

## 2018-08-02 DIAGNOSIS — J449 Chronic obstructive pulmonary disease, unspecified: Secondary | ICD-10-CM | POA: Diagnosis not present

## 2018-08-02 DIAGNOSIS — E440 Moderate protein-calorie malnutrition: Secondary | ICD-10-CM | POA: Diagnosis not present

## 2018-08-02 DIAGNOSIS — Z7984 Long term (current) use of oral hypoglycemic drugs: Secondary | ICD-10-CM | POA: Diagnosis not present

## 2018-08-02 DIAGNOSIS — E1142 Type 2 diabetes mellitus with diabetic polyneuropathy: Secondary | ICD-10-CM | POA: Diagnosis not present

## 2018-08-02 DIAGNOSIS — I25119 Atherosclerotic heart disease of native coronary artery with unspecified angina pectoris: Secondary | ICD-10-CM | POA: Diagnosis not present

## 2018-08-02 DIAGNOSIS — H3530 Unspecified macular degeneration: Secondary | ICD-10-CM | POA: Diagnosis not present

## 2018-08-02 DIAGNOSIS — Z9981 Dependence on supplemental oxygen: Secondary | ICD-10-CM | POA: Diagnosis not present

## 2018-08-02 DIAGNOSIS — E785 Hyperlipidemia, unspecified: Secondary | ICD-10-CM | POA: Diagnosis not present

## 2018-08-02 DIAGNOSIS — Z85118 Personal history of other malignant neoplasm of bronchus and lung: Secondary | ICD-10-CM | POA: Diagnosis not present

## 2018-08-02 DIAGNOSIS — I10 Essential (primary) hypertension: Secondary | ICD-10-CM | POA: Diagnosis not present

## 2018-08-02 NOTE — Telephone Encounter (Signed)
Spoke with patient to schedule overdue F/U with Dr. Rockey Situ. Offered telephone or video visit due to current clinic policies related to COVID 19 precautions. Patient declined but states she would like to reschedule at a later date when she can come to the office for her appointment. Informed patient that our recall list begins on 11/05/2018 and she may have a longer wait time to get an appt but she states she is fine with that. Recall placed for 11/05/2018.

## 2018-08-03 ENCOUNTER — Ambulatory Visit: Payer: Self-pay | Admitting: *Deleted

## 2018-08-03 DIAGNOSIS — Z9981 Dependence on supplemental oxygen: Secondary | ICD-10-CM | POA: Diagnosis not present

## 2018-08-03 DIAGNOSIS — Z85118 Personal history of other malignant neoplasm of bronchus and lung: Secondary | ICD-10-CM | POA: Diagnosis not present

## 2018-08-03 DIAGNOSIS — E785 Hyperlipidemia, unspecified: Secondary | ICD-10-CM | POA: Diagnosis not present

## 2018-08-03 DIAGNOSIS — M109 Gout, unspecified: Secondary | ICD-10-CM | POA: Diagnosis not present

## 2018-08-03 DIAGNOSIS — E1142 Type 2 diabetes mellitus with diabetic polyneuropathy: Secondary | ICD-10-CM | POA: Diagnosis not present

## 2018-08-03 DIAGNOSIS — Z7984 Long term (current) use of oral hypoglycemic drugs: Secondary | ICD-10-CM | POA: Diagnosis not present

## 2018-08-03 DIAGNOSIS — I69354 Hemiplegia and hemiparesis following cerebral infarction affecting left non-dominant side: Secondary | ICD-10-CM | POA: Diagnosis not present

## 2018-08-03 DIAGNOSIS — E039 Hypothyroidism, unspecified: Secondary | ICD-10-CM | POA: Diagnosis not present

## 2018-08-03 DIAGNOSIS — H3530 Unspecified macular degeneration: Secondary | ICD-10-CM | POA: Diagnosis not present

## 2018-08-03 DIAGNOSIS — I25119 Atherosclerotic heart disease of native coronary artery with unspecified angina pectoris: Secondary | ICD-10-CM | POA: Diagnosis not present

## 2018-08-03 DIAGNOSIS — I10 Essential (primary) hypertension: Secondary | ICD-10-CM | POA: Diagnosis not present

## 2018-08-03 DIAGNOSIS — E440 Moderate protein-calorie malnutrition: Secondary | ICD-10-CM | POA: Diagnosis not present

## 2018-08-03 DIAGNOSIS — J449 Chronic obstructive pulmonary disease, unspecified: Secondary | ICD-10-CM | POA: Diagnosis not present

## 2018-08-03 DIAGNOSIS — M4722 Other spondylosis with radiculopathy, cervical region: Secondary | ICD-10-CM | POA: Diagnosis not present

## 2018-08-03 NOTE — Chronic Care Management (AMB) (Signed)
  Chronic Care Management   Outreach Note  08/03/2018 Name: [name] [name] MRN: [id number] DOB: [birth year]  Referred by: Jerrol Banana., MD Reason for referral : Chronic Care Management (Initial outreach unseceffull)   An unsuccessful telephone outreach was attempted today. The patient was referred to the case management team by for assistance with chronic care management needs.   Follow Up Plan: A HIPPA compliant phone message was left for the patient providing contact information and requesting a return call.  The CM team will reach out to the patient again over the next 7 days.    [place] . [place]  ??[email or web address]  ??[phone]

## 2018-08-04 ENCOUNTER — Encounter: Payer: Medicare Other | Admitting: Family Medicine

## 2018-08-05 DIAGNOSIS — I69354 Hemiplegia and hemiparesis following cerebral infarction affecting left non-dominant side: Secondary | ICD-10-CM | POA: Diagnosis not present

## 2018-08-05 DIAGNOSIS — M4722 Other spondylosis with radiculopathy, cervical region: Secondary | ICD-10-CM | POA: Diagnosis not present

## 2018-08-05 DIAGNOSIS — E440 Moderate protein-calorie malnutrition: Secondary | ICD-10-CM | POA: Diagnosis not present

## 2018-08-05 DIAGNOSIS — E1142 Type 2 diabetes mellitus with diabetic polyneuropathy: Secondary | ICD-10-CM | POA: Diagnosis not present

## 2018-08-05 DIAGNOSIS — E785 Hyperlipidemia, unspecified: Secondary | ICD-10-CM | POA: Diagnosis not present

## 2018-08-05 DIAGNOSIS — Z9981 Dependence on supplemental oxygen: Secondary | ICD-10-CM | POA: Diagnosis not present

## 2018-08-05 DIAGNOSIS — Z7984 Long term (current) use of oral hypoglycemic drugs: Secondary | ICD-10-CM | POA: Diagnosis not present

## 2018-08-05 DIAGNOSIS — I10 Essential (primary) hypertension: Secondary | ICD-10-CM | POA: Diagnosis not present

## 2018-08-05 DIAGNOSIS — H3530 Unspecified macular degeneration: Secondary | ICD-10-CM | POA: Diagnosis not present

## 2018-08-05 DIAGNOSIS — E039 Hypothyroidism, unspecified: Secondary | ICD-10-CM | POA: Diagnosis not present

## 2018-08-05 DIAGNOSIS — J449 Chronic obstructive pulmonary disease, unspecified: Secondary | ICD-10-CM | POA: Diagnosis not present

## 2018-08-05 DIAGNOSIS — M109 Gout, unspecified: Secondary | ICD-10-CM | POA: Diagnosis not present

## 2018-08-05 DIAGNOSIS — Z85118 Personal history of other malignant neoplasm of bronchus and lung: Secondary | ICD-10-CM | POA: Diagnosis not present

## 2018-08-05 DIAGNOSIS — I25119 Atherosclerotic heart disease of native coronary artery with unspecified angina pectoris: Secondary | ICD-10-CM | POA: Diagnosis not present

## 2018-08-08 DIAGNOSIS — I10 Essential (primary) hypertension: Secondary | ICD-10-CM | POA: Diagnosis not present

## 2018-08-08 DIAGNOSIS — M109 Gout, unspecified: Secondary | ICD-10-CM | POA: Diagnosis not present

## 2018-08-08 DIAGNOSIS — E1142 Type 2 diabetes mellitus with diabetic polyneuropathy: Secondary | ICD-10-CM | POA: Diagnosis not present

## 2018-08-08 DIAGNOSIS — J449 Chronic obstructive pulmonary disease, unspecified: Secondary | ICD-10-CM | POA: Diagnosis not present

## 2018-08-08 DIAGNOSIS — E785 Hyperlipidemia, unspecified: Secondary | ICD-10-CM | POA: Diagnosis not present

## 2018-08-08 DIAGNOSIS — Z9981 Dependence on supplemental oxygen: Secondary | ICD-10-CM | POA: Diagnosis not present

## 2018-08-08 DIAGNOSIS — E039 Hypothyroidism, unspecified: Secondary | ICD-10-CM | POA: Diagnosis not present

## 2018-08-08 DIAGNOSIS — H3530 Unspecified macular degeneration: Secondary | ICD-10-CM | POA: Diagnosis not present

## 2018-08-08 DIAGNOSIS — M4722 Other spondylosis with radiculopathy, cervical region: Secondary | ICD-10-CM | POA: Diagnosis not present

## 2018-08-08 DIAGNOSIS — Z7984 Long term (current) use of oral hypoglycemic drugs: Secondary | ICD-10-CM | POA: Diagnosis not present

## 2018-08-08 DIAGNOSIS — E440 Moderate protein-calorie malnutrition: Secondary | ICD-10-CM | POA: Diagnosis not present

## 2018-08-08 DIAGNOSIS — I69354 Hemiplegia and hemiparesis following cerebral infarction affecting left non-dominant side: Secondary | ICD-10-CM | POA: Diagnosis not present

## 2018-08-08 DIAGNOSIS — I25119 Atherosclerotic heart disease of native coronary artery with unspecified angina pectoris: Secondary | ICD-10-CM | POA: Diagnosis not present

## 2018-08-08 DIAGNOSIS — Z85118 Personal history of other malignant neoplasm of bronchus and lung: Secondary | ICD-10-CM | POA: Diagnosis not present

## 2018-08-11 DIAGNOSIS — M109 Gout, unspecified: Secondary | ICD-10-CM | POA: Diagnosis not present

## 2018-08-11 DIAGNOSIS — I69354 Hemiplegia and hemiparesis following cerebral infarction affecting left non-dominant side: Secondary | ICD-10-CM | POA: Diagnosis not present

## 2018-08-11 DIAGNOSIS — J449 Chronic obstructive pulmonary disease, unspecified: Secondary | ICD-10-CM | POA: Diagnosis not present

## 2018-08-11 DIAGNOSIS — H3530 Unspecified macular degeneration: Secondary | ICD-10-CM | POA: Diagnosis not present

## 2018-08-11 DIAGNOSIS — E440 Moderate protein-calorie malnutrition: Secondary | ICD-10-CM | POA: Diagnosis not present

## 2018-08-11 DIAGNOSIS — M4722 Other spondylosis with radiculopathy, cervical region: Secondary | ICD-10-CM | POA: Diagnosis not present

## 2018-08-11 DIAGNOSIS — Z85118 Personal history of other malignant neoplasm of bronchus and lung: Secondary | ICD-10-CM | POA: Diagnosis not present

## 2018-08-11 DIAGNOSIS — I10 Essential (primary) hypertension: Secondary | ICD-10-CM | POA: Diagnosis not present

## 2018-08-11 DIAGNOSIS — Z9981 Dependence on supplemental oxygen: Secondary | ICD-10-CM | POA: Diagnosis not present

## 2018-08-11 DIAGNOSIS — E039 Hypothyroidism, unspecified: Secondary | ICD-10-CM | POA: Diagnosis not present

## 2018-08-11 DIAGNOSIS — E1142 Type 2 diabetes mellitus with diabetic polyneuropathy: Secondary | ICD-10-CM | POA: Diagnosis not present

## 2018-08-11 DIAGNOSIS — I25119 Atherosclerotic heart disease of native coronary artery with unspecified angina pectoris: Secondary | ICD-10-CM | POA: Diagnosis not present

## 2018-08-11 DIAGNOSIS — Z7984 Long term (current) use of oral hypoglycemic drugs: Secondary | ICD-10-CM | POA: Diagnosis not present

## 2018-08-11 DIAGNOSIS — E785 Hyperlipidemia, unspecified: Secondary | ICD-10-CM | POA: Diagnosis not present

## 2018-08-16 ENCOUNTER — Telehealth: Payer: Self-pay | Admitting: Family Medicine

## 2018-08-16 DIAGNOSIS — I10 Essential (primary) hypertension: Secondary | ICD-10-CM | POA: Diagnosis not present

## 2018-08-16 DIAGNOSIS — Z7984 Long term (current) use of oral hypoglycemic drugs: Secondary | ICD-10-CM | POA: Diagnosis not present

## 2018-08-16 DIAGNOSIS — Z7951 Long term (current) use of inhaled steroids: Secondary | ICD-10-CM | POA: Diagnosis not present

## 2018-08-16 DIAGNOSIS — I2510 Atherosclerotic heart disease of native coronary artery without angina pectoris: Secondary | ICD-10-CM | POA: Diagnosis not present

## 2018-08-16 DIAGNOSIS — Z87891 Personal history of nicotine dependence: Secondary | ICD-10-CM | POA: Diagnosis not present

## 2018-08-16 DIAGNOSIS — E039 Hypothyroidism, unspecified: Secondary | ICD-10-CM | POA: Diagnosis not present

## 2018-08-16 DIAGNOSIS — M069 Rheumatoid arthritis, unspecified: Secondary | ICD-10-CM | POA: Diagnosis not present

## 2018-08-16 DIAGNOSIS — E785 Hyperlipidemia, unspecified: Secondary | ICD-10-CM | POA: Diagnosis not present

## 2018-08-16 DIAGNOSIS — Z85118 Personal history of other malignant neoplasm of bronchus and lung: Secondary | ICD-10-CM | POA: Diagnosis not present

## 2018-08-16 DIAGNOSIS — I6529 Occlusion and stenosis of unspecified carotid artery: Secondary | ICD-10-CM | POA: Diagnosis not present

## 2018-08-16 DIAGNOSIS — E43 Unspecified severe protein-calorie malnutrition: Secondary | ICD-10-CM | POA: Diagnosis not present

## 2018-08-16 DIAGNOSIS — Z9981 Dependence on supplemental oxygen: Secondary | ICD-10-CM | POA: Diagnosis not present

## 2018-08-16 DIAGNOSIS — E1142 Type 2 diabetes mellitus with diabetic polyneuropathy: Secondary | ICD-10-CM | POA: Diagnosis not present

## 2018-08-16 DIAGNOSIS — M4722 Other spondylosis with radiculopathy, cervical region: Secondary | ICD-10-CM | POA: Diagnosis not present

## 2018-08-16 DIAGNOSIS — K219 Gastro-esophageal reflux disease without esophagitis: Secondary | ICD-10-CM | POA: Diagnosis not present

## 2018-08-16 DIAGNOSIS — H3530 Unspecified macular degeneration: Secondary | ICD-10-CM | POA: Diagnosis not present

## 2018-08-16 DIAGNOSIS — I69354 Hemiplegia and hemiparesis following cerebral infarction affecting left non-dominant side: Secondary | ICD-10-CM | POA: Diagnosis not present

## 2018-08-16 DIAGNOSIS — J441 Chronic obstructive pulmonary disease with (acute) exacerbation: Secondary | ICD-10-CM | POA: Diagnosis not present

## 2018-08-16 NOTE — Telephone Encounter (Signed)
Santiago Glad with [facility] called asked to continue with skilled nursing for COPD and CHF.  One time a week for 9 weeks.  CB#  [phone]  Thanks Con Memos

## 2018-08-16 NOTE — Telephone Encounter (Signed)
[name] [name] as below.

## 2018-08-22 ENCOUNTER — Other Ambulatory Visit: Payer: Self-pay

## 2018-08-22 ENCOUNTER — Ambulatory Visit: Payer: Self-pay | Admitting: Pharmacist

## 2018-08-22 NOTE — Chronic Care Management (AMB) (Signed)
Chronic Care Management   Note  08/22/2018 Name: [name] [name] MRN: [id number] DOB: 1941-03-30  [name] is a 78 y.o. year old female who is a primary care patient of Jerrol Banana., MD. I reached out to [place] by phone today in response to a referral sent by [name] health plan.    [name] was given information about Chronic Care Management services today including:  1. CCM service includes personalized support from designated clinical staff supervised by her physician, including individualized plan of care and coordination with other care providers 2. 24/7 contact phone numbers for assistance for urgent and routine care needs. 3. Service will only be billed when office clinical staff spend 20 minutes or more in a month to coordinate care. 4. Only one practitioner may furnish and bill the service in a calendar month. 5. The patient may stop CCM services at any time (effective at the end of the month) by phone call to the office staff. 6. The patient will be responsible for cost sharing (co-pay) of up to 20% of the service fee (after annual deductible is met).  Patient agreed to services and verbal consent obtained.   Follow up plan: Telephone appointment with CCM team member scheduled for:09/01/2018  [place]  ??[email or web address]   ??[phone]

## 2018-08-23 DIAGNOSIS — M069 Rheumatoid arthritis, unspecified: Secondary | ICD-10-CM | POA: Diagnosis not present

## 2018-08-23 DIAGNOSIS — K219 Gastro-esophageal reflux disease without esophagitis: Secondary | ICD-10-CM | POA: Diagnosis not present

## 2018-08-23 DIAGNOSIS — E43 Unspecified severe protein-calorie malnutrition: Secondary | ICD-10-CM | POA: Diagnosis not present

## 2018-08-23 DIAGNOSIS — E785 Hyperlipidemia, unspecified: Secondary | ICD-10-CM | POA: Diagnosis not present

## 2018-08-23 DIAGNOSIS — Z85118 Personal history of other malignant neoplasm of bronchus and lung: Secondary | ICD-10-CM | POA: Diagnosis not present

## 2018-08-23 DIAGNOSIS — I6529 Occlusion and stenosis of unspecified carotid artery: Secondary | ICD-10-CM | POA: Diagnosis not present

## 2018-08-23 DIAGNOSIS — E039 Hypothyroidism, unspecified: Secondary | ICD-10-CM | POA: Diagnosis not present

## 2018-08-23 DIAGNOSIS — Z7984 Long term (current) use of oral hypoglycemic drugs: Secondary | ICD-10-CM | POA: Diagnosis not present

## 2018-08-23 DIAGNOSIS — I2510 Atherosclerotic heart disease of native coronary artery without angina pectoris: Secondary | ICD-10-CM | POA: Diagnosis not present

## 2018-08-23 DIAGNOSIS — Z87891 Personal history of nicotine dependence: Secondary | ICD-10-CM | POA: Diagnosis not present

## 2018-08-23 DIAGNOSIS — H3530 Unspecified macular degeneration: Secondary | ICD-10-CM | POA: Diagnosis not present

## 2018-08-23 DIAGNOSIS — I69354 Hemiplegia and hemiparesis following cerebral infarction affecting left non-dominant side: Secondary | ICD-10-CM | POA: Diagnosis not present

## 2018-08-23 DIAGNOSIS — M4722 Other spondylosis with radiculopathy, cervical region: Secondary | ICD-10-CM | POA: Diagnosis not present

## 2018-08-23 DIAGNOSIS — Z7951 Long term (current) use of inhaled steroids: Secondary | ICD-10-CM | POA: Diagnosis not present

## 2018-08-23 DIAGNOSIS — I10 Essential (primary) hypertension: Secondary | ICD-10-CM | POA: Diagnosis not present

## 2018-08-23 DIAGNOSIS — Z9981 Dependence on supplemental oxygen: Secondary | ICD-10-CM | POA: Diagnosis not present

## 2018-08-23 DIAGNOSIS — E1142 Type 2 diabetes mellitus with diabetic polyneuropathy: Secondary | ICD-10-CM | POA: Diagnosis not present

## 2018-08-23 DIAGNOSIS — J441 Chronic obstructive pulmonary disease with (acute) exacerbation: Secondary | ICD-10-CM | POA: Diagnosis not present

## 2018-08-24 ENCOUNTER — Encounter: Payer: Self-pay | Admitting: Family Medicine

## 2018-08-24 ENCOUNTER — Ambulatory Visit (INDEPENDENT_AMBULATORY_CARE_PROVIDER_SITE_OTHER): Payer: Medicare Other | Admitting: Family Medicine

## 2018-08-24 ENCOUNTER — Ambulatory Visit: Payer: Self-pay | Admitting: Family Medicine

## 2018-08-24 DIAGNOSIS — B029 Zoster without complications: Secondary | ICD-10-CM

## 2018-08-24 DIAGNOSIS — J449 Chronic obstructive pulmonary disease, unspecified: Secondary | ICD-10-CM | POA: Diagnosis not present

## 2018-08-24 DIAGNOSIS — R0602 Shortness of breath: Secondary | ICD-10-CM | POA: Diagnosis not present

## 2018-08-24 MED ORDER — VALACYCLOVIR HCL 1 G PO TABS: 1000.0000 mg | ORAL_TABLET | Freq: Three times a day (TID) | ORAL | 0 refills | Status: AC

## 2018-08-24 NOTE — Progress Notes (Signed)
[name] [name]  MRN: [id number] DOB: 1940-09-22  Subjective:  HPI    The patient is a 78 year old female who presents via electronic device.  She has complaint of what she believes to be shingles.  She states she broke out in a rash 3 days ago.  She describes it as painful and itching.    No fever. No purulent draiange  Similar to previous episode of shingles Currently taking gabapentin  Virtual Visit via Video Note  I connected with [name] [name] on 08/24/18 at  2:40 PM EDT by a video enabled telemedicine application and verified that I am speaking with the correct person using two identifiers.  Location: Patient: home Provider: home   I discussed the limitations of evaluation and management by telemedicine and the availability of in person appointments. The patient expressed understanding and agreed to proceed.   Patient Active Problem List   Diagnosis Date Noted  . Stroke (cerebrum) ([place]) 05/06/2018  . Malnutrition of moderate degree 03/31/2017  . Acute respiratory failure with hypoxia ([place]) 03/30/2017  . AKI (acute kidney injury) ([place]) 07/15/2016  . Protein-calorie malnutrition, severe 02/08/2016  . Primary cancer of right upper lobe of lung ([place]) 11/20/2015  . Abnormal CT lung screening 10/17/2015  . Personal history of tobacco use, presenting hazards to health 10/15/2015  . Chronic vulvitis 09/26/2014  . Allergic reaction 09/26/2014  . Carotid stenosis 08/30/2014  . Cervical nerve root disorder 08/10/2014  . CAD in native artery 08/10/2014  . B12 deficiency 08/10/2014  . Back ache 08/10/2014  . Bronchitis, chronic ([place]) 08/10/2014  . Diabetes mellitus with polyneuropathy ([place]) 08/10/2014  . Can't get food down 08/10/2014  . Eczema of external ear 08/10/2014  . Accumulation of fluid in tissues 08/10/2014  . Gout 08/10/2014  . Adult hypothyroidism 08/10/2014  . Mononeuritis 08/10/2014  . Muscle ache 08/10/2014  . Disorder of peripheral nervous system  08/10/2014  . Lesion of vulva 08/10/2014  . Cervical spondylosis with radiculopathy 10/19/2013  . COPD exacerbation ([place]) 03/29/2013  . CAD (coronary artery disease) 06/22/2011  . COPD (chronic obstructive pulmonary disease) with emphysema ([place]) 03/07/2010  . CHEST PAIN UNSPECIFIED 07/22/2009  . HLD (hyperlipidemia) 04/01/2009  . Malaise and fatigue 04/01/2009  . Hyperlipidemia 01/18/2009  . TOBACCO ABUSE 01/18/2009  . HYPERTENSION, BENIGN 01/18/2009  . CLAUDICATION 01/18/2009  . Pain in limb 01/18/2009  . CAFL (chronic airflow limitation) ([place]) 01/20/2007  . Late effects of cerebrovascular disease 01/10/2007  . Essential (primary) hypertension 12/22/2006    Past Medical History:  Diagnosis Date  . Abnormal CT lung screening 10/17/2015  . COPD (chronic obstructive pulmonary disease) ([place])   . Coronary artery disease, non-occlusive    a. cath 2006: min nonobs CAD; b. cath 12/2010: cath LAD 50%, RCA 60%; c. 08/2013: Minimal luminal irregs, right dominant system with no significant CAD, diffuse luminal irregs noted. Normal EF 55%, no AS or MS.   . Diabetes mellitus   . Hyperlipemia    Followed by Dr. Rosanna Randy  . Hypertension   . Lung cancer ([place])   . Macular degeneration    rt  . Personal history of tobacco use, presenting hazards to health 10/15/2015  . Pneumonia    hx  . Shortness of breath   . Stroke [facility])     Social History   Socioeconomic History  . Marital status: Married    Spouse name: Not on file  . Number of children: 4  . Years of education: Not on  file  . Highest education level: 11th grade  Occupational History  . Occupation: Retired, Licensed conveyancer: RETIRED  Social Needs  . Financial resource strain: Somewhat hard  . Food insecurity:    Worry: Never true    Inability: Never true  . Transportation needs:    Medical: No    Non-medical: No  Tobacco Use  . Smoking status: Former Smoker    Packs/day: 1.00    Years: 50.00    Pack years:  50.00    Types: Cigarettes    Last attempt to quit: 10/03/2015    Years since quitting: 2.8  . Smokeless tobacco: Never Used  . Tobacco comment: smokes 3 cigs daily 05/06/15. Pt instructed to quit.  Substance and Sexual Activity  . Alcohol use: No    Alcohol/week: 0.0 standard drinks  . Drug use: No  . Sexual activity: Never  Lifestyle  . Physical activity:    Days per week: 0 days    Minutes per session: 0 min  . Stress: Not at all  Relationships  . Social connections:    Talks on phone: Patient refused    Gets together: Patient refused    Attends religious service: Patient refused    Active member of club or organization: Patient refused    Attends meetings of clubs or organizations: Patient refused    Relationship status: Patient refused  . Intimate partner violence:    Fear of current or ex partner: Patient refused    Emotionally abused: Patient refused    Physically abused: Patient refused    Forced sexual activity: Patient refused  Other Topics Concern  . Not on file  Social History Narrative   Married with 4 children   Gets regular exercise   Lives at home with her husband.  Ambulates with a cane.    Outpatient Encounter Medications as of 08/24/2018  Medication Sig  . amLODipine (NORVASC) 5 MG tablet Take 1 tablet (5 mg total) by mouth daily.  Marland Kitchen aspirin EC 81 MG EC tablet Take 1 tablet (81 mg total) by mouth daily.  Marland Kitchen atorvastatin (LIPITOR) 40 MG tablet Take 1 tablet (40 mg total) by mouth daily at 6 PM.  . Blood Glucose Monitoring Suppl (ONE TOUCH ULTRA 2) w/Device KIT 1 each by Does not apply route See admin instructions.  . budesonide-formoterol (SYMBICORT) 160-4.5 MCG/ACT inhaler Inhale 2 puffs into the lungs 2 (two) times daily.  . clopidogrel (PLAVIX) 75 MG tablet Take 1 tablet (75 mg total) by mouth daily.  . fluticasone (FLONASE) 50 MCG/ACT nasal spray Place 2 sprays into both nostrils daily.  Marland Kitchen gabapentin (NEURONTIN) 600 MG tablet Take 0.5 tablets (300 mg  total) by mouth 2 (two) times daily.  Marland Kitchen glimepiride (AMARYL) 2 MG tablet Take 1 tablet (2 mg total) by mouth 2 (two) times daily.  . isosorbide mononitrate (IMDUR) 30 MG 24 hr tablet TAKE 1 TABLET BY MOUTH  DAILY  . LORazepam (ATIVAN) 1 MG tablet Take 1 tablet (1 mg total) by mouth 2 (two) times daily as needed for anxiety.  . magnesium oxide (MAG-OX) 400 (241.3 MG) MG tablet Take 400 mg by mouth 2 (two) times daily.   . metFORMIN (GLUCOPHAGE) 500 MG tablet Take 1 tablet (500 mg total) by mouth 2 (two) times daily with a meal.  . metoprolol tartrate (LOPRESSOR) 25 MG tablet Take 1 tablet (25 mg total) by mouth 2 (two) times daily.  . Multiple Vitamins-Minerals (ICAPS) CAPS Take 1 capsule by  mouth 2 (two) times daily.   . nitroGLYCERIN (NITROSTAT) 0.4 MG SL tablet Place 1 tablet (0.4 mg total) under the tongue every 5 (five) minutes as needed for chest pain.  . ONE TOUCH ULTRA TEST test strip USE TO CHECK BLOOD SUGAR ONCE DAILY  . pantoprazole (PROTONIX) 40 MG tablet Take 1 tablet (40 mg total) by mouth daily.  . mometasone (ELOCON) 0.1 % lotion PLACE 4 DROPS INTO EACH EAR CANAL AT BEDTIME AS NEEDED FOR DRY SKIN/ITCHING/DISCOMFORT.   Facility-Administered Encounter Medications as of 08/24/2018  Medication  . ondansetron (ZOFRAN) 8 mg, dexamethasone (DECADRON) 10 mg in sodium chloride 0.9 % 50 mL IVPB    Allergies  Allergen Reactions  . Coconut Fatty Acids Swelling and Other (See Comments)    Throat swells    ROS  Objective:  There were no vitals taken for this visit.  Physical Exam  Constitutional: She is well-developed, well-nourished, and in no distress.  Skin:  Erythematous base with vesicles under R breast    Assessment and Plan :   I discussed the assessment and treatment plan with the patient. The patient was provided an opportunity to ask questions and all were answered. The patient agreed with the plan and demonstrated an understanding of the instructions.   The patient  was advised to call back or seek an in-person evaluation if the symptoms worsen or if the condition fails to improve as anticipated.  1. Herpes zoster without complication - new rash - about at 72 hour mark, but will still treat with Valtrex 1g TID x7d - continue gabapentin - at max dose for her GFR - can use topical lidocaine OTC for symptom relief - discussed contact precautions - discussed return precautions    Meds ordered this encounter  Medications  . valACYclovir (VALTREX) 1000 MG tablet    Sig: Take 1 tablet (1,000 mg total) by mouth 3 (three) times daily for 7 days.    Dispense:  21 tablet    Refill:  0     Return if symptoms worsen or fail to improve.   The entirety of the information documented in the History of Present Illness, Review of Systems and Physical Exam were personally obtained by me. Portions of this information were initially documented by Joseline Rosasa and Althea Charon, CMA and reviewed by me for thoroughness and accuracy.    Dorsie Burich, Dionne Bucy, MD MPH [facility]

## 2018-08-24 NOTE — Patient Instructions (Signed)
Shingles    Shingles, which is also known as herpes zoster, is an infection that causes a painful skin rash and fluid-filled blisters. It is caused by a virus.  Shingles only develops in people who:   Have had chickenpox.   Have been given a medicine to protect against chickenpox (have been vaccinated). Shingles is rare in this group.  What are the causes?  Shingles is caused by varicella-zoster virus (VZV). This is the same virus that causes chickenpox. After a person is exposed to VZV, the virus stays in the body in an inactive (dormant) state. Shingles develops if the virus is reactivated. This can happen many years after the first (initial) exposure to VZV. It is not known what causes this virus to be reactivated.  What increases the risk?  People who have had chickenpox or received the chickenpox vaccine are at risk for shingles. Shingles infection is more common in people who:   Are older than age 60.   Have a weakened disease-fighting system (immune system), such as people with:  ? HIV.  ? AIDS.  ? Cancer.   Are taking medicines that weaken the immune system, such as transplant medicines.   Are experiencing a lot of stress.  What are the signs or symptoms?  Early symptoms of this condition include itching, tingling, and pain in an area on your skin. Pain may be described as burning, stabbing, or throbbing.  A few days or weeks after early symptoms start, a painful red rash appears. The rash is usually on one side of the body and has a band-like or belt-like pattern. The rash eventually turns into fluid-filled blisters that break open, change into scabs, and dry up in about 2-3 weeks.  At any time during the infection, you may also develop:   A fever.   Chills.   A headache.   An upset stomach.  How is this diagnosed?  This condition is diagnosed with a skin exam. Skin or fluid samples may be taken from the blisters before a diagnosis is made. These samples are examined under a microscope or sent to  a lab for testing.  How is this treated?  The rash may last for several weeks. There is not a specific cure for this condition. Your health care provider will probably prescribe medicines to help you manage pain, recover more quickly, and avoid long-term problems. Medicines may include:   Antiviral drugs.   Anti-inflammatory drugs.   Pain medicines.   Anti-itching medicines (antihistamines).  If the area involved is on your face, you may be referred to a specialist, such as an eye doctor (ophthalmologist) or an ear, nose, and throat (ENT) doctor (otolaryngologist) to help you avoid eye problems, chronic pain, or disability.  Follow these instructions at home:  Medicines   Take over-the-counter and prescription medicines only as told by your health care provider.   Apply an anti-itch cream or numbing cream to the affected area as told by your health care provider.  Relieving itching and discomfort     Apply cold, wet cloths (cold compresses) to the area of the rash or blisters as told by your health care provider.   Cool baths can be soothing. Try adding baking soda or dry oatmeal to the water to reduce itching. Do not bathe in hot water.  Blister and rash care   Keep your rash covered with a loose bandage (dressing). Wear loose-fitting clothing to help ease the pain of material rubbing against the rash.     Keep your rash and blisters clean by washing the area with mild soap and cool water as told by your health care provider.   Check your rash every day for signs of infection. Check for:  ? More redness, swelling, or pain.  ? Fluid or blood.  ? Warmth.  ? Pus or a bad smell.   Do not scratch your rash or pick at your blisters. To help avoid scratching:  ? Keep your fingernails clean and cut short.  ? Wear gloves or mittens while you sleep, if scratching is a problem.  General instructions   Rest as told by your health care provider.   Keep all follow-up visits as told by your health care provider. This  is important.   Wash your hands often with soap and water. If soap and water are not available, use hand sanitizer. Doing this lowers your chance of getting a bacterial skin infection.   Before your blisters change into scabs, your shingles infection can cause chickenpox in people who have never had it or have never been vaccinated against it. To prevent this from happening, avoid contact with other people, especially:  ? Babies.  ? Pregnant women.  ? Children who have eczema.  ? Elderly people who have transplants.  ? People who have chronic illnesses, such as cancer or AIDS.  Contact a health care provider if:   Your pain is not relieved with prescribed medicines.   Your pain does not get better after the rash heals.   You have signs of infection in the rash area, such as:  ? More redness, swelling, or pain around the rash.  ? Fluid or blood coming from the rash.  ? The rash area feeling warm to the touch.  ? Pus or a bad smell coming from the rash.  Get help right away if:   The rash is on your face or nose.   You have facial pain, pain around your eye area, or loss of feeling on one side of your face.   You have difficulty seeing.   You have ear pain or have ringing in your ear.   You have a loss of taste.   Your condition gets worse.  Summary   Shingles, which is also known as herpes zoster, is an infection that causes a painful skin rash and fluid-filled blisters.   This condition is diagnosed with a skin exam. Skin or fluid samples may be taken from the blisters and examined before the diagnosis is made.   Keep your rash covered with a loose bandage (dressing). Wear loose-fitting clothing to help ease the pain of material rubbing against the rash.   Before your blisters change into scabs, your shingles infection can cause chickenpox in people who have never had it or have never been vaccinated against it.  This information is not intended to replace advice given to you by your health care  provider. Make sure you discuss any questions you have with your health care provider.  Document Released: 03/23/2005 Document Revised: 11/25/2016 Document Reviewed: 11/25/2016  Elsevier Interactive Patient Education  2019 [employer]

## 2018-08-25 DIAGNOSIS — Z9981 Dependence on supplemental oxygen: Secondary | ICD-10-CM | POA: Diagnosis not present

## 2018-08-25 DIAGNOSIS — E039 Hypothyroidism, unspecified: Secondary | ICD-10-CM | POA: Diagnosis not present

## 2018-08-25 DIAGNOSIS — J441 Chronic obstructive pulmonary disease with (acute) exacerbation: Secondary | ICD-10-CM | POA: Diagnosis not present

## 2018-08-25 DIAGNOSIS — I69354 Hemiplegia and hemiparesis following cerebral infarction affecting left non-dominant side: Secondary | ICD-10-CM | POA: Diagnosis not present

## 2018-08-25 DIAGNOSIS — Z7984 Long term (current) use of oral hypoglycemic drugs: Secondary | ICD-10-CM | POA: Diagnosis not present

## 2018-08-25 DIAGNOSIS — Z7951 Long term (current) use of inhaled steroids: Secondary | ICD-10-CM | POA: Diagnosis not present

## 2018-08-25 DIAGNOSIS — E43 Unspecified severe protein-calorie malnutrition: Secondary | ICD-10-CM | POA: Diagnosis not present

## 2018-08-25 DIAGNOSIS — E785 Hyperlipidemia, unspecified: Secondary | ICD-10-CM | POA: Diagnosis not present

## 2018-08-25 DIAGNOSIS — I10 Essential (primary) hypertension: Secondary | ICD-10-CM | POA: Diagnosis not present

## 2018-08-25 DIAGNOSIS — E1142 Type 2 diabetes mellitus with diabetic polyneuropathy: Secondary | ICD-10-CM | POA: Diagnosis not present

## 2018-08-25 DIAGNOSIS — H3530 Unspecified macular degeneration: Secondary | ICD-10-CM | POA: Diagnosis not present

## 2018-08-25 DIAGNOSIS — Z85118 Personal history of other malignant neoplasm of bronchus and lung: Secondary | ICD-10-CM | POA: Diagnosis not present

## 2018-08-25 DIAGNOSIS — Z87891 Personal history of nicotine dependence: Secondary | ICD-10-CM | POA: Diagnosis not present

## 2018-08-25 DIAGNOSIS — I6529 Occlusion and stenosis of unspecified carotid artery: Secondary | ICD-10-CM | POA: Diagnosis not present

## 2018-08-25 DIAGNOSIS — M4722 Other spondylosis with radiculopathy, cervical region: Secondary | ICD-10-CM | POA: Diagnosis not present

## 2018-08-25 DIAGNOSIS — K219 Gastro-esophageal reflux disease without esophagitis: Secondary | ICD-10-CM | POA: Diagnosis not present

## 2018-08-25 DIAGNOSIS — I2510 Atherosclerotic heart disease of native coronary artery without angina pectoris: Secondary | ICD-10-CM | POA: Diagnosis not present

## 2018-08-25 DIAGNOSIS — M069 Rheumatoid arthritis, unspecified: Secondary | ICD-10-CM | POA: Diagnosis not present

## 2018-08-26 DIAGNOSIS — J449 Chronic obstructive pulmonary disease, unspecified: Secondary | ICD-10-CM | POA: Diagnosis not present

## 2018-08-30 ENCOUNTER — Other Ambulatory Visit: Payer: Self-pay | Admitting: Family Medicine

## 2018-08-31 DIAGNOSIS — Z7951 Long term (current) use of inhaled steroids: Secondary | ICD-10-CM | POA: Diagnosis not present

## 2018-08-31 DIAGNOSIS — I69354 Hemiplegia and hemiparesis following cerebral infarction affecting left non-dominant side: Secondary | ICD-10-CM | POA: Diagnosis not present

## 2018-08-31 DIAGNOSIS — K219 Gastro-esophageal reflux disease without esophagitis: Secondary | ICD-10-CM | POA: Diagnosis not present

## 2018-08-31 DIAGNOSIS — E039 Hypothyroidism, unspecified: Secondary | ICD-10-CM | POA: Diagnosis not present

## 2018-08-31 DIAGNOSIS — E43 Unspecified severe protein-calorie malnutrition: Secondary | ICD-10-CM | POA: Diagnosis not present

## 2018-08-31 DIAGNOSIS — I10 Essential (primary) hypertension: Secondary | ICD-10-CM | POA: Diagnosis not present

## 2018-08-31 DIAGNOSIS — Z85118 Personal history of other malignant neoplasm of bronchus and lung: Secondary | ICD-10-CM | POA: Diagnosis not present

## 2018-08-31 DIAGNOSIS — Z9981 Dependence on supplemental oxygen: Secondary | ICD-10-CM | POA: Diagnosis not present

## 2018-08-31 DIAGNOSIS — M069 Rheumatoid arthritis, unspecified: Secondary | ICD-10-CM | POA: Diagnosis not present

## 2018-08-31 DIAGNOSIS — Z7984 Long term (current) use of oral hypoglycemic drugs: Secondary | ICD-10-CM | POA: Diagnosis not present

## 2018-08-31 DIAGNOSIS — M4722 Other spondylosis with radiculopathy, cervical region: Secondary | ICD-10-CM | POA: Diagnosis not present

## 2018-08-31 DIAGNOSIS — E1142 Type 2 diabetes mellitus with diabetic polyneuropathy: Secondary | ICD-10-CM | POA: Diagnosis not present

## 2018-08-31 DIAGNOSIS — Z87891 Personal history of nicotine dependence: Secondary | ICD-10-CM | POA: Diagnosis not present

## 2018-08-31 DIAGNOSIS — J441 Chronic obstructive pulmonary disease with (acute) exacerbation: Secondary | ICD-10-CM | POA: Diagnosis not present

## 2018-08-31 DIAGNOSIS — I2510 Atherosclerotic heart disease of native coronary artery without angina pectoris: Secondary | ICD-10-CM | POA: Diagnosis not present

## 2018-08-31 DIAGNOSIS — E785 Hyperlipidemia, unspecified: Secondary | ICD-10-CM | POA: Diagnosis not present

## 2018-08-31 DIAGNOSIS — H3530 Unspecified macular degeneration: Secondary | ICD-10-CM | POA: Diagnosis not present

## 2018-08-31 DIAGNOSIS — I6529 Occlusion and stenosis of unspecified carotid artery: Secondary | ICD-10-CM | POA: Diagnosis not present

## 2018-09-01 ENCOUNTER — Telehealth: Payer: Self-pay | Admitting: Family Medicine

## 2018-09-01 ENCOUNTER — Telehealth: Payer: Medicare Other

## 2018-09-01 DIAGNOSIS — I2510 Atherosclerotic heart disease of native coronary artery without angina pectoris: Secondary | ICD-10-CM | POA: Diagnosis not present

## 2018-09-01 DIAGNOSIS — Z9981 Dependence on supplemental oxygen: Secondary | ICD-10-CM | POA: Diagnosis not present

## 2018-09-01 DIAGNOSIS — Z7951 Long term (current) use of inhaled steroids: Secondary | ICD-10-CM | POA: Diagnosis not present

## 2018-09-01 DIAGNOSIS — Z87891 Personal history of nicotine dependence: Secondary | ICD-10-CM | POA: Diagnosis not present

## 2018-09-01 DIAGNOSIS — I10 Essential (primary) hypertension: Secondary | ICD-10-CM | POA: Diagnosis not present

## 2018-09-01 DIAGNOSIS — M069 Rheumatoid arthritis, unspecified: Secondary | ICD-10-CM | POA: Diagnosis not present

## 2018-09-01 DIAGNOSIS — E43 Unspecified severe protein-calorie malnutrition: Secondary | ICD-10-CM | POA: Diagnosis not present

## 2018-09-01 DIAGNOSIS — I6529 Occlusion and stenosis of unspecified carotid artery: Secondary | ICD-10-CM | POA: Diagnosis not present

## 2018-09-01 DIAGNOSIS — E1142 Type 2 diabetes mellitus with diabetic polyneuropathy: Secondary | ICD-10-CM | POA: Diagnosis not present

## 2018-09-01 DIAGNOSIS — Z85118 Personal history of other malignant neoplasm of bronchus and lung: Secondary | ICD-10-CM | POA: Diagnosis not present

## 2018-09-01 DIAGNOSIS — E039 Hypothyroidism, unspecified: Secondary | ICD-10-CM | POA: Diagnosis not present

## 2018-09-01 DIAGNOSIS — E785 Hyperlipidemia, unspecified: Secondary | ICD-10-CM | POA: Diagnosis not present

## 2018-09-01 DIAGNOSIS — K219 Gastro-esophageal reflux disease without esophagitis: Secondary | ICD-10-CM | POA: Diagnosis not present

## 2018-09-01 DIAGNOSIS — I69354 Hemiplegia and hemiparesis following cerebral infarction affecting left non-dominant side: Secondary | ICD-10-CM | POA: Diagnosis not present

## 2018-09-01 DIAGNOSIS — H3530 Unspecified macular degeneration: Secondary | ICD-10-CM | POA: Diagnosis not present

## 2018-09-01 DIAGNOSIS — M4722 Other spondylosis with radiculopathy, cervical region: Secondary | ICD-10-CM | POA: Diagnosis not present

## 2018-09-01 DIAGNOSIS — Z7984 Long term (current) use of oral hypoglycemic drugs: Secondary | ICD-10-CM | POA: Diagnosis not present

## 2018-09-01 DIAGNOSIS — J441 Chronic obstructive pulmonary disease with (acute) exacerbation: Secondary | ICD-10-CM | POA: Diagnosis not present

## 2018-09-01 NOTE — Telephone Encounter (Signed)
Please Advise

## 2018-09-01 NOTE — Telephone Encounter (Signed)
Need more details. This message is very confusing. Pulse oximetry not severely low. Is she having any fever, shortness of breath, congestion in head, etc. May just need Robitussin-DM 2 teaspoon QID prn cough.

## 2018-09-01 NOTE — Telephone Encounter (Signed)
Santiago Glad with [facility] called saying patient is not feeling as good an usual  Oxygen sat 93%  Feels like she is getting a cold  No breaths sounds are not as loud as she normally is  Wt down 5 lbs week  Cough but not coughing anything up.  Please advise  terichr

## 2018-09-02 NOTE — Telephone Encounter (Signed)
Is she still using the Symbicort for her COPD with emphysema to help breathing and oxygen level? May use Mucinex-DM BID for cough if no fever or congestion. Has an appointment to be seen by Dr. Rosanna Randy on 09-05-18.

## 2018-09-02 NOTE — Telephone Encounter (Signed)
Spoke with Tillie Rung on the phone she states patients o2 yesterday was at 42 and is usually at 95. She reports that patient has had no fever but complains of slight shortness of breath. Tillie Rung states that when she listened to patients lungs yesterday she heard no fluid but stated that her lungs sounded more "diminished." Tillie Rung states the family reports decreased appetite for the past two weeks and stated that yesterday patient appeared to be weak upon standing, but denied symptom of fatigue. Tillie Rung states that cough is present but non productive. Please advise. KW

## 2018-09-05 ENCOUNTER — Ambulatory Visit: Payer: Self-pay | Admitting: Pharmacist

## 2018-09-05 ENCOUNTER — Other Ambulatory Visit: Payer: Self-pay

## 2018-09-05 ENCOUNTER — Ambulatory Visit (INDEPENDENT_AMBULATORY_CARE_PROVIDER_SITE_OTHER): Payer: Medicare Other | Admitting: Family Medicine

## 2018-09-05 ENCOUNTER — Encounter: Payer: Self-pay | Admitting: Family Medicine

## 2018-09-05 VITALS — BP 110/70 | HR 72 | Temp 98.3°F | Resp 20 | Wt 151.0 lb

## 2018-09-05 DIAGNOSIS — E1342 Other specified diabetes mellitus with diabetic polyneuropathy: Secondary | ICD-10-CM

## 2018-09-05 DIAGNOSIS — Z7951 Long term (current) use of inhaled steroids: Secondary | ICD-10-CM | POA: Diagnosis not present

## 2018-09-05 DIAGNOSIS — J441 Chronic obstructive pulmonary disease with (acute) exacerbation: Secondary | ICD-10-CM

## 2018-09-05 DIAGNOSIS — Z7984 Long term (current) use of oral hypoglycemic drugs: Secondary | ICD-10-CM | POA: Diagnosis not present

## 2018-09-05 DIAGNOSIS — H3530 Unspecified macular degeneration: Secondary | ICD-10-CM | POA: Diagnosis not present

## 2018-09-05 DIAGNOSIS — K219 Gastro-esophageal reflux disease without esophagitis: Secondary | ICD-10-CM | POA: Diagnosis not present

## 2018-09-05 DIAGNOSIS — E039 Hypothyroidism, unspecified: Secondary | ICD-10-CM | POA: Diagnosis not present

## 2018-09-05 DIAGNOSIS — I25118 Atherosclerotic heart disease of native coronary artery with other forms of angina pectoris: Secondary | ICD-10-CM

## 2018-09-05 DIAGNOSIS — I69354 Hemiplegia and hemiparesis following cerebral infarction affecting left non-dominant side: Secondary | ICD-10-CM | POA: Diagnosis not present

## 2018-09-05 DIAGNOSIS — E43 Unspecified severe protein-calorie malnutrition: Secondary | ICD-10-CM

## 2018-09-05 DIAGNOSIS — I2510 Atherosclerotic heart disease of native coronary artery without angina pectoris: Secondary | ICD-10-CM | POA: Diagnosis not present

## 2018-09-05 DIAGNOSIS — I10 Essential (primary) hypertension: Secondary | ICD-10-CM | POA: Diagnosis not present

## 2018-09-05 DIAGNOSIS — I6529 Occlusion and stenosis of unspecified carotid artery: Secondary | ICD-10-CM | POA: Diagnosis not present

## 2018-09-05 DIAGNOSIS — F17200 Nicotine dependence, unspecified, uncomplicated: Secondary | ICD-10-CM

## 2018-09-05 DIAGNOSIS — E1142 Type 2 diabetes mellitus with diabetic polyneuropathy: Secondary | ICD-10-CM | POA: Diagnosis not present

## 2018-09-05 DIAGNOSIS — R0602 Shortness of breath: Secondary | ICD-10-CM

## 2018-09-05 DIAGNOSIS — M069 Rheumatoid arthritis, unspecified: Secondary | ICD-10-CM | POA: Diagnosis not present

## 2018-09-05 DIAGNOSIS — Z87891 Personal history of nicotine dependence: Secondary | ICD-10-CM | POA: Diagnosis not present

## 2018-09-05 DIAGNOSIS — E785 Hyperlipidemia, unspecified: Secondary | ICD-10-CM | POA: Diagnosis not present

## 2018-09-05 DIAGNOSIS — C3411 Malignant neoplasm of upper lobe, right bronchus or lung: Secondary | ICD-10-CM | POA: Diagnosis not present

## 2018-09-05 DIAGNOSIS — Z9981 Dependence on supplemental oxygen: Secondary | ICD-10-CM | POA: Diagnosis not present

## 2018-09-05 DIAGNOSIS — Z85118 Personal history of other malignant neoplasm of bronchus and lung: Secondary | ICD-10-CM | POA: Diagnosis not present

## 2018-09-05 DIAGNOSIS — I639 Cerebral infarction, unspecified: Secondary | ICD-10-CM

## 2018-09-05 DIAGNOSIS — M4722 Other spondylosis with radiculopathy, cervical region: Secondary | ICD-10-CM | POA: Diagnosis not present

## 2018-09-05 LAB — POCT GLYCOSYLATED HEMOGLOBIN (HGB A1C): Hemoglobin A1C: 8.7 % — AB (ref 4.0–5.6)

## 2018-09-05 NOTE — Telephone Encounter (Signed)
Patient was seen in office today. KW

## 2018-09-05 NOTE — Progress Notes (Signed)
Patient: [name] [name] Female    DOB: June 14, 1940   78 y.o.   MRN: [id number] Visit Date: 09/05/2018  Today's Provider: [name], MD   Chief Complaint  Patient presents with  . Diabetes   Subjective:     HPI  Diabetes Mellitus Type II, Follow-up:   Lab Results  Component Value Date   HGBA1C 8.7 (A) 09/05/2018   HGBA1C 7.5 (H) 05/07/2018   HGBA1C 7.6 (H) 05/06/2018    Last seen for diabetes 4 months ago.  Management since then includes no changes. She reports good compliance with treatment. She is not having side effects.  Current symptoms include none and have been stable. Home blood sugar records: fasting range: 160s  Episodes of hypoglycemia? no   Current Insulin Regimen: none  Most Recent Eye Exam: up to date Weight trend: stable Prior visit with dietician: No Current exercise: no regular exercise Current diet habits: well balanced  She has quit smoking in the past year. Pertinent Labs:    Component Value Date/Time   CHOL 164 05/07/2018 0355   CHOL 210 (H) 05/24/2017 1411   TRIG 109 05/07/2018 0355   HDL 48 05/07/2018 0355   HDL 52 05/24/2017 1411   LDLCALC 94 05/07/2018 0355   LDLCALC 88 05/24/2017 1411   CREATININE 1.48 (H) 06/17/2018 0756    Wt Readings from Last 3 Encounters:  09/05/18 151 lb (68.5 kg)  06/08/18 151 lb 12.6 oz (68.9 kg)  06/07/18 151 lb 6.4 oz (68.7 kg)    Allergies  Allergen Reactions  . Coconut Fatty Acids Swelling and Other (See Comments)    Throat swells     Current Outpatient Medications:  .  amLODipine (NORVASC) 5 MG tablet, Take 1 tablet (5 mg total) by mouth daily., Disp: 90 tablet, Rfl: 3 .  aspirin EC 81 MG EC tablet, Take 1 tablet (81 mg total) by mouth daily., Disp: 30 tablet, Rfl: 0 .  atorvastatin (LIPITOR) 40 MG tablet, Take 1 tablet (40 mg total) by mouth daily at 6 PM., Disp: 90 tablet, Rfl: 3 .  Blood Glucose Monitoring Suppl (ONE TOUCH ULTRA 2) w/Device KIT, 1 each by Does not apply  route See admin instructions., Disp: 1 each, Rfl: 0 .  clopidogrel (PLAVIX) 75 MG tablet, TAKE 1 TABLET BY MOUTH EVERY DAY, Disp: 90 tablet, Rfl: 1 .  fluticasone (FLONASE) 50 MCG/ACT nasal spray, Place 2 sprays into both nostrils daily., Disp: 16 g, Rfl: 6 .  gabapentin (NEURONTIN) 600 MG tablet, Take 0.5 tablets (300 mg total) by mouth 2 (two) times daily., Disp: 180 tablet, Rfl: 3 .  glimepiride (AMARYL) 2 MG tablet, Take 1 tablet (2 mg total) by mouth 2 (two) times daily., Disp: 180 tablet, Rfl: 3 .  isosorbide mononitrate (IMDUR) 30 MG 24 hr tablet, TAKE 1 TABLET BY MOUTH  DAILY, Disp: 90 tablet, Rfl: 3 .  LORazepam (ATIVAN) 1 MG tablet, Take 1 tablet (1 mg total) by mouth 2 (two) times daily as needed for anxiety., Disp: 30 tablet, Rfl: 5 .  magnesium oxide (MAG-OX) 400 (241.3 MG) MG tablet, Take 400 mg by mouth 2 (two) times daily. , Disp: , Rfl:  .  metFORMIN (GLUCOPHAGE) 500 MG tablet, Take 1 tablet (500 mg total) by mouth 2 (two) times daily with a meal., Disp: 180 tablet, Rfl: 3 .  metoprolol tartrate (LOPRESSOR) 25 MG tablet, Take 1 tablet (25 mg total) by mouth 2 (two) times daily., Disp: 180 tablet,  Rfl: 3 .  mometasone (ELOCON) 0.1 % lotion, PLACE 4 DROPS INTO EACH EAR CANAL AT BEDTIME AS NEEDED FOR DRY SKIN/ITCHING/DISCOMFORT., Disp: , Rfl:  .  Multiple Vitamins-Minerals (ICAPS) CAPS, Take 1 capsule by mouth 2 (two) times daily. , Disp: , Rfl:  .  ONE TOUCH ULTRA TEST test strip, USE TO CHECK BLOOD SUGAR ONCE DAILY, Disp: 100 each, Rfl: 11 .  pantoprazole (PROTONIX) 40 MG tablet, Take 1 tablet (40 mg total) by mouth daily., Disp: 90 tablet, Rfl: 3 .  budesonide-formoterol (SYMBICORT) 160-4.5 MCG/ACT inhaler, Inhale 2 puffs into the lungs 2 (two) times daily. (Patient not taking: Reported on 09/05/2018), Disp: 3 Inhaler, Rfl: 3 .  nitroGLYCERIN (NITROSTAT) 0.4 MG SL tablet, Place 1 tablet (0.4 mg total) under the tongue every 5 (five) minutes as needed for chest pain. (Patient not taking:  Reported on 09/05/2018), Disp: 25 tablet, Rfl: 3 No current facility-administered medications for this visit.   Facility-Administered Medications Ordered in Other Visits:  .  ondansetron (ZOFRAN) 8 mg, dexamethasone (DECADRON) 10 mg in sodium chloride 0.9 % 50 mL IVPB, , Intravenous, Once, Finnegan, Kathlene November, MD  Review of Systems  Constitutional: Negative for activity change, appetite change, chills, fatigue and unexpected weight change.  HENT: Negative.   Eyes: Negative.   Respiratory: Negative for cough and shortness of breath.   Cardiovascular: Negative for chest pain, palpitations and leg swelling.  Gastrointestinal: Negative.   Endocrine: Negative.   Skin: Negative for color change, pallor, rash and wound.  Allergic/Immunologic: Negative.   Neurological: Negative for dizziness and headaches.  Psychiatric/Behavioral: Negative for self-injury, sleep disturbance and suicidal ideas. The patient is not nervous/anxious.     Social History   Tobacco Use  . Smoking status: Former Smoker    Packs/day: 1.00    Years: 50.00    Pack years: 50.00    Types: Cigarettes    Last attempt to quit: 10/03/2015    Years since quitting: 2.9  . Smokeless tobacco: Never Used  . Tobacco comment: smokes 3 cigs daily 05/06/15. Pt instructed to quit.  Substance Use Topics  . Alcohol use: No    Alcohol/week: 0.0 standard drinks      Objective:   BP 110/70   Pulse 72   Temp 98.3 F (36.8 C)   Resp 20   Wt 151 lb (68.5 kg) Comment: per pt  SpO2 94%   BMI 25.92 kg/m  Vitals:   09/05/18 1432  BP: 110/70  Pulse: 72  Resp: 20  Temp: 98.3 F (36.8 C)  SpO2: 94%  Weight: 151 lb (68.5 kg)     Physical Exam Vitals signs reviewed.  Constitutional:      Appearance: She is well-developed.     Comments: Patient sitting in wheelchair due to chronic weakness.  HENT:     Head: Normocephalic and atraumatic.     Right Ear: External ear normal.     Left Ear: External ear normal.     Nose: Nose  normal.  Eyes:     General: No scleral icterus.    Conjunctiva/sclera: Conjunctivae normal.  Neck:     Thyroid: No thyromegaly.  Cardiovascular:     Rate and Rhythm: Normal rate and regular rhythm.     Heart sounds: Normal heart sounds.  Pulmonary:     Effort: Pulmonary effort is normal.     Breath sounds: Normal breath sounds.  Abdominal:     Palpations: Abdomen is soft.  Skin:    General: Skin is  warm and dry.  Neurological:     General: No focal deficit present.     Mental Status: She is alert and oriented to person, place, and time. Mental status is at baseline.  Psychiatric:        Mood and Affect: Mood normal.        Behavior: Behavior normal.        Thought Content: Thought content normal.        Judgment: Judgment normal.         Assessment & Plan    1. Diabetic polyneuropathy associated with other specified diabetes mellitus ([place]) Follow-up 3 to 4 months.  Patient needs a new meter check blood sugars. - POCT glycosylated hemoglobin (Hb A1C)  2. Coronary artery disease of native artery of native heart with stable angina pectoris ([place]) All risk factors treated.  3. Primary cancer of right upper lobe of lung ([place]) Followed by Dr. [name].  4. COPD exacerbation ([place]) Presently stable.  She actually might benefit from pulmonary rehab.  5. Cerebrovascular accident (CVA), unspecified mechanism ([place]) On aspirin and Plavix.  6. TOBACCO ABUSE Patient states she is quit smoking.  7. Shortness of breath   8. Protein-calorie malnutrition, severe Progressive problem.  9. CAD in native artery      [name], MD  [facility]

## 2018-09-12 ENCOUNTER — Telehealth: Payer: Self-pay

## 2018-09-12 ENCOUNTER — Ambulatory Visit (INDEPENDENT_AMBULATORY_CARE_PROVIDER_SITE_OTHER): Payer: Medicare Other | Admitting: Family Medicine

## 2018-09-12 ENCOUNTER — Telehealth: Payer: Self-pay | Admitting: General Practice

## 2018-09-12 DIAGNOSIS — R110 Nausea: Secondary | ICD-10-CM | POA: Diagnosis not present

## 2018-09-12 DIAGNOSIS — R0981 Nasal congestion: Secondary | ICD-10-CM | POA: Diagnosis not present

## 2018-09-12 DIAGNOSIS — R509 Fever, unspecified: Secondary | ICD-10-CM

## 2018-09-12 DIAGNOSIS — R05 Cough: Secondary | ICD-10-CM

## 2018-09-12 DIAGNOSIS — R059 Cough, unspecified: Secondary | ICD-10-CM

## 2018-09-12 DIAGNOSIS — Z20822 Contact with and (suspected) exposure to covid-19: Secondary | ICD-10-CM

## 2018-09-12 NOTE — Addendum Note (Signed)
Addended by: Dimple Nanas on: 09/12/2018 05:24 PM   Modules accepted: Orders

## 2018-09-12 NOTE — Progress Notes (Signed)
Patient: [name] [name] Female    DOB: 1940-06-05   78 y.o.   MRN: [id number] Visit Date: 09/12/2018  Today's Provider: Lavon Paganini, MD   Chief Complaint  Patient presents with  . URI   Subjective:    I, Porsha McClurkin CMA, am acting as a scribe for Lavon Paganini, MD.   Virtual Visit via Video Note  I connected with [name] [name] on 09/12/18 at  3:40 PM EDT by a video enabled telemedicine application and verified that I am speaking with the correct person using two identifiers.   Patient location: home Provider location: [place] involved in the visit: patient, provider, patient's daughter    I discussed the limitations of evaluation and management by telemedicine and the availability of in person appointments. The patient expressed understanding and agreed to proceed.  URI   This is a new problem. The current episode started in the past 7 days. The problem has been unchanged. Maximum temperature: 99.7 yesterday 09/11/18 & today 09/12/18. Associated symptoms include coughing and nausea. Pertinent negatives include no congestion, headaches, sinus pain, vomiting or wheezing. She has tried decongestant, acetaminophen, increased fluids and inhaler use for the symptoms. The treatment provided mild relief.   Symptoms present for 3 days Cough is non-productive Also with nasal congestion, sore throat, runny nose Tried breathing treatment last night that helped some Feels that drainage is making her nausea   Allergies  Allergen Reactions  . Coconut Fatty Acids Swelling and Other (See Comments)    Throat swells     Current Outpatient Medications:  .  amLODipine (NORVASC) 5 MG tablet, Take 1 tablet (5 mg total) by mouth daily., Disp: 90 tablet, Rfl: 3 .  aspirin EC 81 MG EC tablet, Take 1 tablet (81 mg total) by mouth daily., Disp: 30 tablet, Rfl: 0 .  atorvastatin (LIPITOR) 40 MG tablet, Take 1 tablet (40 mg total) by mouth daily at 6 PM.,  Disp: 90 tablet, Rfl: 3 .  Blood Glucose Monitoring Suppl (ONE TOUCH ULTRA 2) w/Device KIT, 1 each by Does not apply route See admin instructions., Disp: 1 each, Rfl: 0 .  clopidogrel (PLAVIX) 75 MG tablet, TAKE 1 TABLET BY MOUTH EVERY DAY, Disp: 90 tablet, Rfl: 1 .  fluticasone (FLONASE) 50 MCG/ACT nasal spray, Place 2 sprays into both nostrils daily., Disp: 16 g, Rfl: 6 .  gabapentin (NEURONTIN) 600 MG tablet, Take 0.5 tablets (300 mg total) by mouth 2 (two) times daily., Disp: 180 tablet, Rfl: 3 .  glimepiride (AMARYL) 2 MG tablet, Take 1 tablet (2 mg total) by mouth 2 (two) times daily., Disp: 180 tablet, Rfl: 3 .  isosorbide mononitrate (IMDUR) 30 MG 24 hr tablet, TAKE 1 TABLET BY MOUTH  DAILY, Disp: 90 tablet, Rfl: 3 .  LORazepam (ATIVAN) 1 MG tablet, Take 1 tablet (1 mg total) by mouth 2 (two) times daily as needed for anxiety., Disp: 30 tablet, Rfl: 5 .  magnesium oxide (MAG-OX) 400 (241.3 MG) MG tablet, Take 400 mg by mouth 2 (two) times daily. , Disp: , Rfl:  .  metFORMIN (GLUCOPHAGE) 500 MG tablet, Take 1 tablet (500 mg total) by mouth 2 (two) times daily with a meal., Disp: 180 tablet, Rfl: 3 .  metoprolol tartrate (LOPRESSOR) 25 MG tablet, Take 1 tablet (25 mg total) by mouth 2 (two) times daily., Disp: 180 tablet, Rfl: 3 .  mometasone (ELOCON) 0.1 % lotion, PLACE 4 DROPS INTO EACH EAR CANAL AT  BEDTIME AS NEEDED FOR DRY SKIN/ITCHING/DISCOMFORT., Disp: , Rfl:  .  Multiple Vitamins-Minerals (ICAPS) CAPS, Take 1 capsule by mouth 2 (two) times daily. , Disp: , Rfl:  .  nitroGLYCERIN (NITROSTAT) 0.4 MG SL tablet, Place 1 tablet (0.4 mg total) under the tongue every 5 (five) minutes as needed for chest pain., Disp: 25 tablet, Rfl: 3 .  ONE TOUCH ULTRA TEST test strip, USE TO CHECK BLOOD SUGAR ONCE DAILY, Disp: 100 each, Rfl: 11 .  pantoprazole (PROTONIX) 40 MG tablet, Take 1 tablet (40 mg total) by mouth daily., Disp: 90 tablet, Rfl: 3 .  budesonide-formoterol (SYMBICORT) 160-4.5 MCG/ACT  inhaler, Inhale 2 puffs into the lungs 2 (two) times daily. (Patient not taking: Reported on 09/05/2018), Disp: 3 Inhaler, Rfl: 3 No current facility-administered medications for this visit.   Facility-Administered Medications Ordered in Other Visits:  .  ondansetron (ZOFRAN) 8 mg, dexamethasone (DECADRON) 10 mg in sodium chloride 0.9 % 50 mL IVPB, , Intravenous, Once, [name], Kathlene November, MD  Review of Systems  Constitutional: Positive for fever (Yesterday 09/11/18 & Today 09/12/18).  HENT: Negative.  Negative for congestion, sinus pressure and sinus pain.   Respiratory: Positive for cough and shortness of breath. Negative for wheezing.   Gastrointestinal: Positive for nausea. Negative for vomiting.  Neurological: Positive for dizziness and weakness (in legs per patient). Negative for headaches.    Social History   Tobacco Use  . Smoking status: Former Smoker    Packs/day: 1.00    Years: 50.00    Pack years: 50.00    Types: Cigarettes    Last attempt to quit: 10/03/2015    Years since quitting: 2.9  . Smokeless tobacco: Never Used  . Tobacco comment: smokes 3 cigs daily 05/06/15. Pt instructed to quit.  Substance Use Topics  . Alcohol use: No    Alcohol/week: 0.0 standard drinks      Objective:   There were no vitals taken for this visit. There were no vitals filed for this visit.   Physical Exam Constitutional:      Appearance: Normal appearance.  Pulmonary:     Effort: Pulmonary effort is normal. No respiratory distress.  Neurological:     Mental Status: She is alert and oriented to person, place, and time. Mental status is at baseline.  Psychiatric:        Mood and Affect: Mood normal.        Behavior: Behavior normal.        Assessment & Plan     I discussed the assessment and treatment plan with the patient. The patient was provided an opportunity to ask questions and all were answered. The patient agreed with the plan and demonstrated an understanding of the  instructions.   The patient was advised to call back or seek an in-person evaluation if the symptoms worsen or if the condition fails to improve as anticipated.  1. Cough 2. Fever, unspecified fever cause 3. Nausea 4. Nasal congestion - constellation of symptoms concerning for possible COVID19 infection - no known exposure, but she is high risk given comorbidities - will send for outpatient testing  - will also obtain CXR to ensure no pneumonia - discussed symptomatic treatment, return precautions - discussed that no abx necessary at this time, but will Rx if there is concern for CAP on CXR - DG Chest 2 View; Future   Return if symptoms worsen or fail to improve.   The entirety of the information documented in the History of Present  Illness, Review of Systems and Physical Exam were personally obtained by me. Portions of this information were initially documented by [facility], CMA and reviewed by me for thoroughness and accuracy.    , Dionne Bucy, MD MPH [facility]

## 2018-09-12 NOTE — Telephone Encounter (Signed)
Mr. [name] [name] called requesting antibiotic to treat for possible pneumonia. Mrs. [name] [name] will go for COVID 19 testing today. Per Dr. Jacinto Reap., patient will have chest x-ray done also. Please advise. CB# [phone]. [facility] use [facility] [place]

## 2018-09-12 NOTE — Telephone Encounter (Signed)
Pt has been scheduled covid 19 testing. Scheduled pt's apt with Spouse Mr. Langhorst.   Pt was referred by: Virginia Crews, MD

## 2018-09-12 NOTE — Telephone Encounter (Signed)
-----   Message from Virginia Crews, MD sent at 09/12/2018  3:58 PM EDT ----- Cough, fever, nausea, lung cancer

## 2018-09-13 ENCOUNTER — Other Ambulatory Visit: Payer: Medicare Other

## 2018-09-13 DIAGNOSIS — Z7689 Persons encountering health services in other specified circumstances: Secondary | ICD-10-CM | POA: Diagnosis not present

## 2018-09-13 DIAGNOSIS — Z20822 Contact with and (suspected) exposure to covid-19: Secondary | ICD-10-CM

## 2018-09-13 NOTE — Telephone Encounter (Signed)
Please review. Thanks!  

## 2018-09-13 NOTE — Addendum Note (Signed)
Addended by: Denman George on: 09/13/2018 11:20 AM   Modules accepted: Orders

## 2018-09-13 NOTE — Chronic Care Management (AMB) (Signed)
  Chronic Care Management   Follow Up Note   09/13/2018 Name: MADHURI VACCA MRN: [id number] DOB: 06-06-1940  Referred by: Jerrol Banana., MD Reason for referral : Chronic Care Management (Initial pharmacy consult)   DESARAE PLACIDE is a 78 y.o. year old female who is a primary care patient of Jerrol Banana., MD. The clinical pharmacist was consulted to review the patient's medications by the patient's health plan.   Review of patient status, including review of consultants reports, relevant laboratory and other test results, and collaboration with appropriate care team members and the patient's provider was performed as part of comprehensive patient evaluation and provision of chronic care management services.    Assessment: Mrs. Sobotka is a pleasant, 78 year old patient of Dr. Rosanna Randy. She is very familiar with her medications, the dosages, and indications. She does not require close follow up with clinical pharmacy despite referral from health plan. Does need refill on NitroStat as current bottle is likely old and out of date. Will follow up on a 90 day schedule.  Goals Addressed            This Visit's Progress   . I want to stay well (pt-stated)       Current Barriers:  . High pill burden  Pharmacist Clinical Goal(s):  Marland Kitchen Over the next 0- days, patient will demonstrate improved understanding of prescribed medications and rationale for usage as evidenced by adherence to medications by patient report and fill history  Interventions: . Comprehensive medication review performed. . Discussed plans with patient for ongoing care management follow up and provided patient with direct contact information for care management team  Patient Self Care Activities:  . Self administers medications as prescribed . Attends all scheduled provider appointments . Calls provider office for new concerns or questions  Initial goal documentation          Recommendations discussed  with provider: New Nitrostat 0.4mg  SL tablet refill request   Follow up: Telephone follow up appointment with care management team member scheduled for: clinical pharmacist follow up in 90 days  Ruben Reason, PharmD Clinical Pharmacist [place] [phone]

## 2018-09-13 NOTE — Patient Instructions (Signed)
  Thank you allowing the Chronic Care Management Team to be a part of your care!   Goals Addressed            This Visit's Progress   . I want to stay well (pt-stated)       Current Barriers:  . High pill burden  Pharmacist Clinical Goal(s):  Marland Kitchen Over the next 0- days, patient will demonstrate improved understanding of prescribed medications and rationale for usage as evidenced by adherence to medications by patient report and fill history  Interventions: . Comprehensive medication review performed. . Discussed plans with patient for ongoing care management follow up and provided patient with direct contact information for care management team  Patient Self Care Activities:  . Self administers medications as prescribed . Attends all scheduled provider appointments . Calls provider office for new concerns or questions  Initial goal documentation          Please call a member of the CCM (Chronic Care Management) Team with any questions or case management needs:   Vanetta Mulders, BSN Nurse Care Coordinator  [phone]  Ruben Reason, PharmD  Clinical Pharmacist  [phone]  Elliot Gurney, [place] Clinical Social Worker [phone]

## 2018-09-13 NOTE — Telephone Encounter (Signed)
Please review

## 2018-09-13 NOTE — Telephone Encounter (Signed)
Pt's husband called saying she fell last night and they called EMS and they checked her out.  She checked out ok but they ask her if she wanted to go to the ER.  She declined going to ER.  She is ok this am.but he really wants an antibiotic called in so she will not get pneumonia.  She is going to take the Covid test today.  CB#  [phone]  Thanks Con Memos

## 2018-09-16 ENCOUNTER — Telehealth: Payer: Self-pay | Admitting: *Deleted

## 2018-09-16 NOTE — Telephone Encounter (Signed)
Patient called through [facility] requesting covid-19 results. Advised results are still pending. Patient states she is feeling better.

## 2018-09-19 NOTE — Telephone Encounter (Signed)
Please review. Have you seen results?

## 2018-09-19 NOTE — Telephone Encounter (Signed)
Patient advised that lab is still pending . KW

## 2018-09-19 NOTE — Telephone Encounter (Signed)
I cannot see results back yet.

## 2018-09-20 ENCOUNTER — Telehealth: Payer: Self-pay

## 2018-09-20 DIAGNOSIS — Z85118 Personal history of other malignant neoplasm of bronchus and lung: Secondary | ICD-10-CM | POA: Diagnosis not present

## 2018-09-20 DIAGNOSIS — E785 Hyperlipidemia, unspecified: Secondary | ICD-10-CM | POA: Diagnosis not present

## 2018-09-20 DIAGNOSIS — Z87891 Personal history of nicotine dependence: Secondary | ICD-10-CM | POA: Diagnosis not present

## 2018-09-20 DIAGNOSIS — E039 Hypothyroidism, unspecified: Secondary | ICD-10-CM | POA: Diagnosis not present

## 2018-09-20 DIAGNOSIS — I2510 Atherosclerotic heart disease of native coronary artery without angina pectoris: Secondary | ICD-10-CM | POA: Diagnosis not present

## 2018-09-20 DIAGNOSIS — M069 Rheumatoid arthritis, unspecified: Secondary | ICD-10-CM | POA: Diagnosis not present

## 2018-09-20 DIAGNOSIS — J441 Chronic obstructive pulmonary disease with (acute) exacerbation: Secondary | ICD-10-CM | POA: Diagnosis not present

## 2018-09-20 DIAGNOSIS — M4722 Other spondylosis with radiculopathy, cervical region: Secondary | ICD-10-CM | POA: Diagnosis not present

## 2018-09-20 DIAGNOSIS — E43 Unspecified severe protein-calorie malnutrition: Secondary | ICD-10-CM | POA: Diagnosis not present

## 2018-09-20 DIAGNOSIS — H3530 Unspecified macular degeneration: Secondary | ICD-10-CM | POA: Diagnosis not present

## 2018-09-20 DIAGNOSIS — I69354 Hemiplegia and hemiparesis following cerebral infarction affecting left non-dominant side: Secondary | ICD-10-CM | POA: Diagnosis not present

## 2018-09-20 DIAGNOSIS — I10 Essential (primary) hypertension: Secondary | ICD-10-CM | POA: Diagnosis not present

## 2018-09-20 DIAGNOSIS — Z7984 Long term (current) use of oral hypoglycemic drugs: Secondary | ICD-10-CM | POA: Diagnosis not present

## 2018-09-20 DIAGNOSIS — Z9981 Dependence on supplemental oxygen: Secondary | ICD-10-CM | POA: Diagnosis not present

## 2018-09-20 DIAGNOSIS — I6529 Occlusion and stenosis of unspecified carotid artery: Secondary | ICD-10-CM | POA: Diagnosis not present

## 2018-09-20 DIAGNOSIS — E1142 Type 2 diabetes mellitus with diabetic polyneuropathy: Secondary | ICD-10-CM | POA: Diagnosis not present

## 2018-09-20 DIAGNOSIS — Z7951 Long term (current) use of inhaled steroids: Secondary | ICD-10-CM | POA: Diagnosis not present

## 2018-09-20 DIAGNOSIS — K219 Gastro-esophageal reflux disease without esophagitis: Secondary | ICD-10-CM | POA: Diagnosis not present

## 2018-09-20 NOTE — Telephone Encounter (Signed)
Santiago Glad w/ [facility] called and stated the patient is wanting her COVID-19 test results. Santiago Glad stated that the patient has declined her homecare due to pending results and want let nobody come out until her results comes in. Please advise.

## 2018-09-20 NOTE — Telephone Encounter (Signed)
I cannot find Covid results in chart--can you check behind me? I see test ordered.

## 2018-09-21 ENCOUNTER — Inpatient Hospital Stay: Payer: Self-pay | Admitting: Family Medicine

## 2018-09-21 NOTE — Telephone Encounter (Signed)
Advised husband that Covid test results were negative.

## 2018-09-21 NOTE — Telephone Encounter (Signed)
I have sent a message to one of the nurses at [facility] to check the status of this.

## 2018-09-22 ENCOUNTER — Ambulatory Visit
Admission: RE | Admit: 2018-09-22 | Discharge: 2018-09-22 | Disposition: A | Discharge status: Home / Self Care | Payer: Medicare Other | Source: Home / Self Care | Attending: Family Medicine | Admitting: Family Medicine

## 2018-09-22 ENCOUNTER — Encounter: Payer: Self-pay | Admitting: Emergency Medicine

## 2018-09-22 ENCOUNTER — Other Ambulatory Visit: Payer: Self-pay

## 2018-09-22 ENCOUNTER — Emergency Department: Payer: Medicare Other

## 2018-09-22 ENCOUNTER — Telehealth: Payer: Self-pay

## 2018-09-22 ENCOUNTER — Ambulatory Visit
Admission: RE | Admit: 2018-09-22 | Discharge: 2018-09-22 | Disposition: A | Discharge status: Home / Self Care | Payer: Medicare Other | Source: Ambulatory Visit | Attending: Family Medicine | Admitting: Family Medicine

## 2018-09-22 ENCOUNTER — Observation Stay
Admission: EM | Admit: 2018-09-22 | Discharge: 2018-09-25 | Disposition: A | Discharge status: Home / Self Care | Payer: Medicare Other | Attending: Internal Medicine | Admitting: Internal Medicine

## 2018-09-22 DIAGNOSIS — M4722 Other spondylosis with radiculopathy, cervical region: Secondary | ICD-10-CM | POA: Diagnosis not present

## 2018-09-22 DIAGNOSIS — R079 Chest pain, unspecified: Secondary | ICD-10-CM | POA: Diagnosis not present

## 2018-09-22 DIAGNOSIS — E785 Hyperlipidemia, unspecified: Secondary | ICD-10-CM | POA: Diagnosis not present

## 2018-09-22 DIAGNOSIS — R110 Nausea: Secondary | ICD-10-CM

## 2018-09-22 DIAGNOSIS — Z79899 Other long term (current) drug therapy: Secondary | ICD-10-CM | POA: Insufficient documentation

## 2018-09-22 DIAGNOSIS — I10 Essential (primary) hypertension: Secondary | ICD-10-CM | POA: Diagnosis not present

## 2018-09-22 DIAGNOSIS — Z7984 Long term (current) use of oral hypoglycemic drugs: Secondary | ICD-10-CM | POA: Insufficient documentation

## 2018-09-22 DIAGNOSIS — Z8673 Personal history of transient ischemic attack (TIA), and cerebral infarction without residual deficits: Secondary | ICD-10-CM | POA: Insufficient documentation

## 2018-09-22 DIAGNOSIS — I252 Old myocardial infarction: Secondary | ICD-10-CM | POA: Insufficient documentation

## 2018-09-22 DIAGNOSIS — Z9981 Dependence on supplemental oxygen: Secondary | ICD-10-CM | POA: Diagnosis not present

## 2018-09-22 DIAGNOSIS — Z85118 Personal history of other malignant neoplasm of bronchus and lung: Secondary | ICD-10-CM | POA: Diagnosis not present

## 2018-09-22 DIAGNOSIS — I69354 Hemiplegia and hemiparesis following cerebral infarction affecting left non-dominant side: Secondary | ICD-10-CM | POA: Diagnosis not present

## 2018-09-22 DIAGNOSIS — R05 Cough: Secondary | ICD-10-CM | POA: Diagnosis not present

## 2018-09-22 DIAGNOSIS — Z87891 Personal history of nicotine dependence: Secondary | ICD-10-CM | POA: Insufficient documentation

## 2018-09-22 DIAGNOSIS — Z1159 Encounter for screening for other viral diseases: Secondary | ICD-10-CM | POA: Insufficient documentation

## 2018-09-22 DIAGNOSIS — Z7902 Long term (current) use of antithrombotics/antiplatelets: Secondary | ICD-10-CM | POA: Insufficient documentation

## 2018-09-22 DIAGNOSIS — R509 Fever, unspecified: Secondary | ICD-10-CM

## 2018-09-22 DIAGNOSIS — Z981 Arthrodesis status: Secondary | ICD-10-CM | POA: Diagnosis not present

## 2018-09-22 DIAGNOSIS — Z7951 Long term (current) use of inhaled steroids: Secondary | ICD-10-CM | POA: Insufficient documentation

## 2018-09-22 DIAGNOSIS — M109 Gout, unspecified: Secondary | ICD-10-CM | POA: Insufficient documentation

## 2018-09-22 DIAGNOSIS — E119 Type 2 diabetes mellitus without complications: Secondary | ICD-10-CM | POA: Insufficient documentation

## 2018-09-22 DIAGNOSIS — M069 Rheumatoid arthritis, unspecified: Secondary | ICD-10-CM | POA: Diagnosis not present

## 2018-09-22 DIAGNOSIS — I6529 Occlusion and stenosis of unspecified carotid artery: Secondary | ICD-10-CM | POA: Diagnosis not present

## 2018-09-22 DIAGNOSIS — R0602 Shortness of breath: Secondary | ICD-10-CM

## 2018-09-22 DIAGNOSIS — E43 Unspecified severe protein-calorie malnutrition: Secondary | ICD-10-CM | POA: Diagnosis not present

## 2018-09-22 DIAGNOSIS — J189 Pneumonia, unspecified organism: Secondary | ICD-10-CM | POA: Diagnosis not present

## 2018-09-22 DIAGNOSIS — R0902 Hypoxemia: Secondary | ICD-10-CM | POA: Diagnosis not present

## 2018-09-22 DIAGNOSIS — J438 Other emphysema: Secondary | ICD-10-CM

## 2018-09-22 DIAGNOSIS — I6523 Occlusion and stenosis of bilateral carotid arteries: Secondary | ICD-10-CM

## 2018-09-22 DIAGNOSIS — R42 Dizziness and giddiness: Secondary | ICD-10-CM | POA: Insufficient documentation

## 2018-09-22 DIAGNOSIS — J441 Chronic obstructive pulmonary disease with (acute) exacerbation: Secondary | ICD-10-CM | POA: Diagnosis not present

## 2018-09-22 DIAGNOSIS — E039 Hypothyroidism, unspecified: Secondary | ICD-10-CM | POA: Insufficient documentation

## 2018-09-22 DIAGNOSIS — J439 Emphysema, unspecified: Secondary | ICD-10-CM | POA: Insufficient documentation

## 2018-09-22 DIAGNOSIS — R0981 Nasal congestion: Secondary | ICD-10-CM | POA: Insufficient documentation

## 2018-09-22 DIAGNOSIS — E1142 Type 2 diabetes mellitus with diabetic polyneuropathy: Secondary | ICD-10-CM | POA: Diagnosis not present

## 2018-09-22 DIAGNOSIS — Z7982 Long term (current) use of aspirin: Secondary | ICD-10-CM | POA: Insufficient documentation

## 2018-09-22 DIAGNOSIS — K219 Gastro-esophageal reflux disease without esophagitis: Secondary | ICD-10-CM | POA: Diagnosis not present

## 2018-09-22 DIAGNOSIS — I2510 Atherosclerotic heart disease of native coronary artery without angina pectoris: Secondary | ICD-10-CM | POA: Insufficient documentation

## 2018-09-22 DIAGNOSIS — R059 Cough, unspecified: Secondary | ICD-10-CM

## 2018-09-22 DIAGNOSIS — H3530 Unspecified macular degeneration: Secondary | ICD-10-CM | POA: Diagnosis not present

## 2018-09-22 LAB — CBC WITH DIFFERENTIAL/PLATELET
Abs Immature Granulocytes: 0.04 10*3/uL (ref 0.00–0.07)
Basophils Absolute: 0 10*3/uL (ref 0.0–0.1)
Basophils Relative: 0 %
Eosinophils Absolute: 0.2 10*3/uL (ref 0.0–0.5)
Eosinophils Relative: 2 %
HCT: 32.8 % — ABNORMAL LOW (ref 36.0–46.0)
Hemoglobin: 10.4 g/dL — ABNORMAL LOW (ref 12.0–15.0)
Immature Granulocytes: 1 %
Lymphocytes Relative: 13 %
Lymphs Abs: 0.9 10*3/uL (ref 0.7–4.0)
MCH: 31 pg (ref 26.0–34.0)
MCHC: 31.7 g/dL (ref 30.0–36.0)
MCV: 97.6 fL (ref 80.0–100.0)
Monocytes Absolute: 0.6 10*3/uL (ref 0.1–1.0)
Monocytes Relative: 8 %
Neutro Abs: 5.3 10*3/uL (ref 1.7–7.7)
Neutrophils Relative %: 76 %
Platelets: 284 10*3/uL (ref 150–400)
RBC: 3.36 MIL/uL — ABNORMAL LOW (ref 3.87–5.11)
RDW: 14.8 % (ref 11.5–15.5)
WBC: 7 10*3/uL (ref 4.0–10.5)
nRBC: 0 % (ref 0.0–0.2)

## 2018-09-22 LAB — COMPREHENSIVE METABOLIC PANEL
ALT: 19 U/L (ref 0–44)
AST: 16 U/L (ref 15–41)
Albumin: 3.7 g/dL (ref 3.5–5.0)
Alkaline Phosphatase: 78 U/L (ref 38–126)
Anion gap: 14 (ref 5–15)
BUN: 28 mg/dL — ABNORMAL HIGH (ref 8–23)
CO2: 26 mmol/L (ref 22–32)
Calcium: 9.1 mg/dL (ref 8.9–10.3)
Chloride: 96 mmol/L — ABNORMAL LOW (ref 98–111)
Creatinine, Ser: 1.49 mg/dL — ABNORMAL HIGH (ref 0.44–1.00)
GFR calc Af Amer: 39 mL/min — ABNORMAL LOW (ref >60)
GFR calc non Af Amer: 34 mL/min — ABNORMAL LOW (ref >60)
Glucose, Bld: 219 mg/dL — ABNORMAL HIGH (ref 70–99)
Potassium: 4.1 mmol/L (ref 3.5–5.1)
Sodium: 136 mmol/L (ref 135–145)
Total Bilirubin: 0.4 mg/dL (ref 0.3–1.2)
Total Protein: 7.4 g/dL (ref 6.5–8.1)

## 2018-09-22 LAB — TROPONIN I: Troponin I: <0.03 ng/mL (ref <0.03)

## 2018-09-22 MED ORDER — NITROGLYCERIN 0.4 MG SL SUBL: 0.4000 mg | SUBLINGUAL_TABLET | SUBLINGUAL | 3 refills | Status: DC | PRN

## 2018-09-22 MED ORDER — NITROGLYCERIN 0.4 MG SL SUBL: 0.4000 mg | SUBLINGUAL_TABLET | SUBLINGUAL | 11 refills | Status: DC | PRN

## 2018-09-22 NOTE — Telephone Encounter (Signed)
Patient's husband calling that he wants nausea medicine to be send in patient. He was asking about the patient's xray results advised that results haven't been read yet and this lady took the conversation between me and patient's husband and stated that patient is very sick,sob, oxygen level reading in the 80-88 and is not able to keep the food in and that "we are telling them anything" and that since I didn't have the imaging results that practically we are just neglecting the patient. I advised that since the patient was not doing well and not eating well that and saturations are low even on oxygen that patient need to go to the ED and that according to the last messages patient's husband didn't want to take patient to the ED. I told them husband and person on the phone together that even if we had the imaging result if it was the case but with patient sob,not eating/or not able to keep her food down that she still needs to to the ED. Person on the phone stated she was the niece and husband was advised to take patient to ED and he keep saying that he didn't want to.

## 2018-09-22 NOTE — Telephone Encounter (Signed)
Patients husband called to give Dr. Rosanna Randy an update on patient's health. The home health nurse came by their house today and did an evaluation. He says patient has been experiencing a lot of indigestion in her chest, right sided weakness, oxygen in the 80's (this morning as low as 88%). Patient is at the Outpatient imaging center right now getting an x ray.  He states they do not want to go to the ER. He is requesting a prescription for Nitroglycerine tablets to be sent to [facility].

## 2018-09-22 NOTE — ED Triage Notes (Signed)
PT arrives from home via ems with concerns over chest tightness for the last week. Pt had XRAY at PCP today but has pending results. Pt also concerns over increased shortness of breath. Pt states she "could not move" due to feeling exertionally short of breath. Pt wears supplemental oxygen at night. Pt's room air saturation 92% in triage.

## 2018-09-22 NOTE — Telephone Encounter (Signed)
Medication was sent into the pharmacy.

## 2018-09-22 NOTE — Telephone Encounter (Signed)
Ok for NTG tabs--thx

## 2018-09-23 DIAGNOSIS — R079 Chest pain, unspecified: Secondary | ICD-10-CM | POA: Diagnosis present

## 2018-09-23 DIAGNOSIS — E119 Type 2 diabetes mellitus without complications: Secondary | ICD-10-CM | POA: Diagnosis not present

## 2018-09-23 DIAGNOSIS — R0600 Dyspnea, unspecified: Secondary | ICD-10-CM | POA: Diagnosis not present

## 2018-09-23 DIAGNOSIS — I2510 Atherosclerotic heart disease of native coronary artery without angina pectoris: Secondary | ICD-10-CM | POA: Diagnosis not present

## 2018-09-23 LAB — SARS CORONAVIRUS 2 BY RT PCR (HOSPITAL ORDER, PERFORMED IN ~~LOC~~ HOSPITAL LAB): SARS Coronavirus 2: NEGATIVE

## 2018-09-23 LAB — GLUCOSE, CAPILLARY
Glucose-Capillary: 165 mg/dL — ABNORMAL HIGH (ref 70–99)
Glucose-Capillary: 124 mg/dL — ABNORMAL HIGH (ref 70–99)
Glucose-Capillary: 195 mg/dL — ABNORMAL HIGH (ref 70–99)
Glucose-Capillary: 177 mg/dL — ABNORMAL HIGH (ref 70–99)

## 2018-09-23 LAB — TROPONIN I
Troponin I: <0.03 ng/mL (ref <0.03)
Troponin I: <0.03 ng/mL (ref <0.03)

## 2018-09-23 LAB — BRAIN NATRIURETIC PEPTIDE: B Natriuretic Peptide: 29 pg/mL (ref 0.0–100.0)

## 2018-09-23 MED ORDER — FLUTICASONE PROPIONATE 50 MCG/ACT NA SUSP
2.0000 | Freq: Every day | NASAL | Status: DC
  Administered 2018-09-24 – 2018-09-25 (×2): 2 via NASAL
  Filled 2018-09-23 (×2): qty 16

## 2018-09-23 MED ORDER — ZOLPIDEM TARTRATE 5 MG PO TABS: 5.0000 mg | ORAL_TABLET | Freq: Every evening | ORAL | Status: DC | PRN

## 2018-09-23 MED ORDER — MORPHINE SULFATE (PF) 2 MG/ML IV SOLN: 2.0000 mg | INTRAVENOUS | Status: DC | PRN

## 2018-09-23 MED ORDER — GLIMEPIRIDE 2 MG PO TABS
2.0000 mg | ORAL_TABLET | Freq: Two times a day (BID) | ORAL | Status: DC
  Administered 2018-09-23 – 2018-09-25 (×4): 2 mg via ORAL
  Filled 2018-09-23 (×7): qty 1

## 2018-09-23 MED ORDER — AMLODIPINE BESYLATE 5 MG PO TABS
5.0000 mg | ORAL_TABLET | Freq: Every day | ORAL | Status: DC
  Administered 2018-09-23 – 2018-09-25 (×3): 5 mg via ORAL
  Filled 2018-09-23 (×3): qty 1

## 2018-09-23 MED ORDER — OCUVITE-LUTEIN PO CAPS
1.0000 | ORAL_CAPSULE | Freq: Two times a day (BID) | ORAL | Status: DC
  Administered 2018-09-23 – 2018-09-25 (×4): 1 via ORAL
  Filled 2018-09-23 (×6): qty 1

## 2018-09-23 MED ORDER — ENOXAPARIN SODIUM 40 MG/0.4ML ~~LOC~~ SOLN
40.0000 mg | SUBCUTANEOUS | Status: DC
  Administered 2018-09-23 – 2018-09-24 (×2): 40 mg via SUBCUTANEOUS
  Filled 2018-09-23 (×2): qty 0.4

## 2018-09-23 MED ORDER — ACETAMINOPHEN 325 MG PO TABS: 650.0000 mg | ORAL_TABLET | ORAL | Status: DC | PRN

## 2018-09-23 MED ORDER — ALUM & MAG HYDROXIDE-SIMETH 200-200-20 MG/5ML PO SUSP
30.0000 mL | Freq: Once | ORAL | Status: DC
  Filled 2018-09-23: qty 30

## 2018-09-23 MED ORDER — MAGNESIUM OXIDE 400 (241.3 MG) MG PO TABS
400.0000 mg | ORAL_TABLET | Freq: Two times a day (BID) | ORAL | Status: DC
  Administered 2018-09-23 – 2018-09-25 (×5): 400 mg via ORAL
  Filled 2018-09-23 (×5): qty 1

## 2018-09-23 MED ORDER — GABAPENTIN 300 MG PO CAPS
300.0000 mg | ORAL_CAPSULE | Freq: Two times a day (BID) | ORAL | Status: DC
  Administered 2018-09-23 – 2018-09-25 (×5): 300 mg via ORAL
  Filled 2018-09-23 (×5): qty 1

## 2018-09-23 MED ORDER — ASPIRIN EC 81 MG PO TBEC
81.0000 mg | DELAYED_RELEASE_TABLET | Freq: Every day | ORAL | Status: DC
  Administered 2018-09-23 – 2018-09-25 (×3): 81 mg via ORAL
  Filled 2018-09-23 (×3): qty 1

## 2018-09-23 MED ORDER — LIDOCAINE VISCOUS HCL 2 % MT SOLN
15.0000 mL | Freq: Once | OROMUCOSAL | Status: DC
  Filled 2018-09-23: qty 15

## 2018-09-23 MED ORDER — ISOSORBIDE MONONITRATE ER 30 MG PO TB24
30.0000 mg | ORAL_TABLET | Freq: Every day | ORAL | Status: DC
  Administered 2018-09-23 – 2018-09-25 (×3): 30 mg via ORAL
  Filled 2018-09-23 (×3): qty 1

## 2018-09-23 MED ORDER — LORAZEPAM 1 MG PO TABS: 1.0000 mg | ORAL_TABLET | Freq: Two times a day (BID) | ORAL | Status: DC | PRN

## 2018-09-23 MED ORDER — NITROGLYCERIN 0.4 MG SL SUBL: 0.4000 mg | SUBLINGUAL_TABLET | SUBLINGUAL | Status: DC | PRN

## 2018-09-23 MED ORDER — CLOPIDOGREL BISULFATE 75 MG PO TABS
75.0000 mg | ORAL_TABLET | Freq: Every day | ORAL | Status: DC
  Administered 2018-09-23 – 2018-09-25 (×3): 75 mg via ORAL
  Filled 2018-09-23 (×3): qty 1

## 2018-09-23 MED ORDER — METOPROLOL TARTRATE 25 MG PO TABS
25.0000 mg | ORAL_TABLET | Freq: Two times a day (BID) | ORAL | Status: DC
  Administered 2018-09-23 – 2018-09-24 (×3): 25 mg via ORAL
  Filled 2018-09-23 (×3): qty 1

## 2018-09-23 MED ORDER — ONDANSETRON HCL 4 MG/2ML IJ SOLN: 4.0000 mg | Freq: Four times a day (QID) | INTRAMUSCULAR | Status: DC | PRN

## 2018-09-23 MED ORDER — MOMETASONE FUROATE 0.1 % EX CREA
1.0000 "application " | TOPICAL_CREAM | Freq: Every day | CUTANEOUS | Status: DC
  Administered 2018-09-25: 1 via TOPICAL
  Filled 2018-09-23 (×2): qty 15

## 2018-09-23 MED ORDER — PANTOPRAZOLE SODIUM 40 MG PO TBEC
40.0000 mg | DELAYED_RELEASE_TABLET | Freq: Two times a day (BID) | ORAL | Status: DC
  Administered 2018-09-23 – 2018-09-25 (×4): 40 mg via ORAL
  Filled 2018-09-23 (×4): qty 1

## 2018-09-23 MED ORDER — INSULIN ASPART 100 UNIT/ML ~~LOC~~ SOLN
0.0000 [IU] | Freq: Three times a day (TID) | SUBCUTANEOUS | Status: DC
  Administered 2018-09-23 (×2): 2 [IU] via SUBCUTANEOUS
  Administered 2018-09-24: 12:00:00 8 [IU] via SUBCUTANEOUS
  Administered 2018-09-24 (×2): 1 [IU] via SUBCUTANEOUS
  Administered 2018-09-25: 2 [IU] via SUBCUTANEOUS
  Filled 2018-09-23 (×6): qty 1

## 2018-09-23 MED ORDER — IPRATROPIUM-ALBUTEROL 0.5-2.5 (3) MG/3ML IN SOLN
3.0000 mL | Freq: Once | RESPIRATORY_TRACT | Status: AC
  Administered 2018-09-23: 01:00:00 3 mL via RESPIRATORY_TRACT
  Filled 2018-09-23: qty 3

## 2018-09-23 MED ORDER — ATORVASTATIN CALCIUM 20 MG PO TABS
40.0000 mg | ORAL_TABLET | Freq: Every day | ORAL | Status: DC
  Administered 2018-09-23 – 2018-09-24 (×2): 40 mg via ORAL
  Filled 2018-09-23 (×2): qty 2

## 2018-09-23 MED ORDER — PANTOPRAZOLE SODIUM 40 MG PO TBEC
40.0000 mg | DELAYED_RELEASE_TABLET | Freq: Every day | ORAL | Status: DC
  Administered 2018-09-23: 40 mg via ORAL
  Filled 2018-09-23: qty 1

## 2018-09-23 NOTE — ED Provider Notes (Signed)
[facility] Emergency Department Provider Note _   First MD Initiated Contact with Patient 09/22/18 2340     (approximate)  I have reviewed the triage vital signs and the nursing notes.   HISTORY  Chief Complaint Shortness of Breath   HPI [name] [name] is a 78 y.o. female with below list of medical conditions including COPD diabetes hyperlipidemia and MI presents to the emergency department secondary to dyspnea with exertion and nonradiating left-sided chest pain is 1 week.  Patient denies any lower extremity pain or swelling.  Patient states that her current pain score is 4 out of 10.  Patient was seen by her primary care provider yesterday and a chest x-ray was performed however she is unaware of the results.        Past Medical History:  Diagnosis Date  . Abnormal CT lung screening 10/17/2015  . COPD (chronic obstructive pulmonary disease) ([place])   . Coronary artery disease, non-occlusive    a. cath 2006: min nonobs CAD; b. cath 12/2010: cath LAD 50%, RCA 60%; c. 08/2013: Minimal luminal irregs, right dominant system with no significant CAD, diffuse luminal irregs noted. Normal EF 55%, no AS or MS.   . Diabetes mellitus   . Hyperlipemia    Followed by Dr. Rosanna Randy  . Hypertension   . Lung cancer ([place])   . Macular degeneration    rt  . Personal history of tobacco use, presenting hazards to health 10/15/2015  . Pneumonia    hx  . Shortness of breath   . Stroke [facility])     Patient Active Problem List   Diagnosis Date Noted  . Chest pain 09/23/2018  . Stroke (cerebrum) ([place]) 05/06/2018  . Malnutrition of moderate degree 03/31/2017  . Acute respiratory failure with hypoxia ([place]) 03/30/2017  . AKI (acute kidney injury) ([place]) 07/15/2016  . Protein-calorie malnutrition, severe 02/08/2016  . Primary cancer of right upper lobe of lung ([place]) 11/20/2015  . Abnormal CT lung screening 10/17/2015  . Personal history of tobacco use, presenting hazards to  health 10/15/2015  . Chronic vulvitis 09/26/2014  . Allergic reaction 09/26/2014  . Carotid stenosis 08/30/2014  . Cervical nerve root disorder 08/10/2014  . CAD in native artery 08/10/2014  . B12 deficiency 08/10/2014  . Back ache 08/10/2014  . Bronchitis, chronic ([place]) 08/10/2014  . Diabetes mellitus with polyneuropathy ([place]) 08/10/2014  . Can't get food down 08/10/2014  . Eczema of external ear 08/10/2014  . Accumulation of fluid in tissues 08/10/2014  . Gout 08/10/2014  . Adult hypothyroidism 08/10/2014  . Mononeuritis 08/10/2014  . Muscle ache 08/10/2014  . Disorder of peripheral nervous system 08/10/2014  . Lesion of vulva 08/10/2014  . Cervical spondylosis with radiculopathy 10/19/2013  . COPD exacerbation ([place]) 03/29/2013  . CAD (coronary artery disease) 06/22/2011  . COPD (chronic obstructive pulmonary disease) with emphysema ([place]) 03/07/2010  . CHEST PAIN UNSPECIFIED 07/22/2009  . HLD (hyperlipidemia) 04/01/2009  . Malaise and fatigue 04/01/2009  . Hyperlipidemia 01/18/2009  . TOBACCO ABUSE 01/18/2009  . HYPERTENSION, BENIGN 01/18/2009  . CLAUDICATION 01/18/2009  . Pain in limb 01/18/2009  . CAFL (chronic airflow limitation) ([place]) 01/20/2007  . Late effects of cerebrovascular disease 01/10/2007  . Essential (primary) hypertension 12/22/2006    Past Surgical History:  Procedure Laterality Date  . ABDOMINAL HYSTERECTOMY    . ANTERIOR CERVICAL DECOMP/DISCECTOMY FUSION N/A 10/19/2013   Procedure: CERVICAL FIVE-SIX ANTERIOR CERVICAL DECOMPRESSION WITH FUSION INTERBODY PROSTHESIS PLATING AND PEEK CAGE;  Surgeon: Dellis Filbert  Flo Shanks, MD;  Location: [place] [facility];  Service: Neurosurgery;  Laterality: N/A;  . BACK SURGERY  80's  . BREAST CYST EXCISION Left    left negative   . CARDIAC CATHETERIZATION  05/2004  . CATARACT EXTRACTION Left   . CHOLECYSTECTOMY    . ENDARTERECTOMY Left 10/24/2014   Procedure: ENDARTERECTOMY CAROTID;  Surgeon: Algernon Huxley, MD;  Location: [facility]  ORS;  Service: Vascular;  Laterality: Left;  . ENDOBRONCHIAL ULTRASOUND N/A 11/14/2015   Procedure: ENDOBRONCHIAL ULTRASOUND;  Surgeon: [name], MD;  Location: [facility] ORS;  Service: Cardiopulmonary;  Laterality: N/A;  . PERIPHERAL VASCULAR CATHETERIZATION N/A 12/04/2015   Procedure: Glori Luis Cath Insertion;  Surgeon: Algernon Huxley, MD;  Location: [place] CV LAB;  Service: Cardiovascular;  Laterality: N/A;  . PORTA CATH REMOVAL N/A 05/17/2017   Procedure: PORTA CATH REMOVAL;  Surgeon: Algernon Huxley, MD;  Location: [place] CV LAB;  Service: Cardiovascular;  Laterality: N/A;  . TONSILLECTOMY AND ADENOIDECTOMY    . VESICOVAGINAL FISTULA CLOSURE W/ TAH      Prior to Admission medications   Medication Sig Start Date End Date Taking? Authorizing Provider  amLODipine (NORVASC) 5 MG tablet Take 1 tablet (5 mg total) by mouth daily. 07/19/18  Yes Jerrol Banana., MD  aspirin EC 81 MG EC tablet Take 1 tablet (81 mg total) by mouth daily. 05/08/18  Yes Mayo, Pete Pelt, MD  atorvastatin (LIPITOR) 40 MG tablet Take 1 tablet (40 mg total) by mouth daily at 6 PM. 07/19/18  Yes Jerrol Banana., MD  clopidogrel (PLAVIX) 75 MG tablet TAKE 1 TABLET BY MOUTH EVERY DAY 08/30/18  Yes Jerrol Banana., MD  fluticasone [facility]) 50 MCG/ACT nasal spray Place 2 sprays into both nostrils daily. 07/21/17  Yes Bacigalupo, Dionne Bucy, MD  gabapentin (NEURONTIN) 600 MG tablet Take 0.5 tablets (300 mg total) by mouth 2 (two) times daily. 06/02/18  Yes Jerrol Banana., MD  glimepiride (AMARYL) 2 MG tablet Take 1 tablet (2 mg total) by mouth 2 (two) times daily. 06/02/18  Yes Jerrol Banana., MD  isosorbide mononitrate (IMDUR) 30 MG 24 hr tablet TAKE 1 TABLET BY MOUTH  DAILY 04/20/18  Yes Jerrol Banana., MD  LORazepam (ATIVAN) 1 MG tablet Take 1 tablet (1 mg total) by mouth 2 (two) times daily as needed for anxiety. 05/24/17  Yes Jerrol Banana., MD  magnesium oxide (MAG-OX) 400 (241.3  MG) MG tablet Take 400 mg by mouth 2 (two) times daily.  08/04/13  Yes [provider]  metFORMIN (GLUCOPHAGE) 500 MG tablet Take 1 tablet (500 mg total) by mouth 2 (two) times daily with a meal. 07/19/18  Yes Jerrol Banana., MD  metoprolol tartrate (LOPRESSOR) 25 MG tablet Take 1 tablet (25 mg total) by mouth 2 (two) times daily. 06/02/18  Yes Jerrol Banana., MD  Multiple Vitamins-Minerals (ICAPS) CAPS Take 1 capsule by mouth 2 (two) times daily.    Yes [provider]  nitroGLYCERIN (NITROSTAT) 0.4 MG SL tablet Place 1 tablet (0.4 mg total) under the tongue every 5 (five) minutes as needed for chest pain. 09/22/18  Yes Jerrol Banana., MD  pantoprazole (PROTONIX) 40 MG tablet Take 1 tablet (40 mg total) by mouth daily. 06/02/18  Yes Jerrol Banana., MD  Blood Glucose Monitoring Suppl (ONE TOUCH ULTRA 2) w/Device KIT 1 each by Does not apply route See admin instructions. 10/27/17  Jerrol Banana., MD  budesonide-formoterol [facility]) 160-4.5 MCG/ACT inhaler Inhale 2 puffs into the lungs 2 (two) times daily. Patient not taking: Reported on 09/05/2018 09/08/17   Jerrol Banana., MD  mometasone (ELOCON) 0.1 % lotion PLACE 4 DROPS INTO EACH EAR CANAL AT BEDTIME AS NEEDED FOR DRY SKIN/ITCHING/DISCOMFORT. 05/17/18   [provider]  nitroGLYCERIN (NITROSTAT) 0.4 MG SL tablet Place 1 tablet (0.4 mg total) under the tongue every 5 (five) minutes as needed for chest pain. 09/22/18   Jerrol Banana., MD  ONE TOUCH ULTRA TEST test strip USE TO CHECK BLOOD SUGAR ONCE DAILY 10/27/17   Jerrol Banana., MD    Allergies Coconut fatty acids  Family History  Problem Relation Age of Onset  . Stroke Mother        Massive  . Heart attack Father        Massive  . Heart attack Brother   . Heart attack Brother   . Lung cancer Maternal Grandfather   . Heart attack Paternal Grandmother        MI    Social History Social History    Tobacco Use  . Smoking status: Former Smoker    Packs/day: 1.00    Years: 50.00    Pack years: 50.00    Types: Cigarettes    Quit date: 10/03/2015    Years since quitting: 2.9  . Smokeless tobacco: Never Used  . Tobacco comment: smokes 3 cigs daily 05/06/15. Pt instructed to quit.  Substance Use Topics  . Alcohol use: No    Alcohol/week: 0.0 standard drinks  . Drug use: No    Review of Systems Constitutional: No fever/chills Eyes: No visual changes. ENT: No sore throat. Cardiovascular: Positive for chest pain. Respiratory: Positive for shortness of breath. Gastrointestinal: No abdominal pain.  No nausea, no vomiting.  No diarrhea.  No constipation. Genitourinary: Negative for dysuria. Musculoskeletal: Negative for neck pain.  Negative for back pain. Integumentary: Negative for rash. Neurological: Negative for headaches, focal weakness or numbness.   ____________________________________________   PHYSICAL EXAM:  VITAL SIGNS: ED Triage Vitals  Enc Vitals Group     BP 09/22/18 1831 (!) 118/55     Pulse Rate 09/22/18 1831 78     Resp 09/22/18 1831 20     Temp 09/22/18 1831 98.5 F (36.9 C)     Temp Source 09/22/18 1831 Oral     SpO2 09/22/18 1831 92 %     Weight 09/22/18 1832 68.5 kg (151 lb)     Height 09/22/18 1832 1.626 m ('5\' 4"' )     Head Circumference --      Peak Flow --      Pain Score 09/22/18 1832 4     Pain Loc --      Pain Edu? --      Excl. in [place]? --     Constitutional: Alert and oriented. Well appearing and in no acute distress. Eyes: Conjunctivae are normal.  Mouth/Throat: Mucous membranes are moist.  Oropharynx non-erythematous. Neck: No stridor.   Cardiovascular: Normal rate, regular rhythm. Good peripheral circulation. Grossly normal heart sounds. Respiratory: Normal respiratory effort.  No retractions.  Mild expiratory wheezing. Gastrointestinal: Soft and nontender. No distention.  Musculoskeletal: No lower extremity tenderness nor edema. No  gross deformities of extremities. Neurologic:  Normal speech and language. No gross focal neurologic deficits are appreciated.  Skin:  Skin is warm, dry and intact. No rash noted. Psychiatric: Mood and affect are  normal. Speech and behavior are normal.  ____________________________________________   LABS (all labs ordered are listed, but only abnormal results are displayed)  Labs Reviewed  CBC WITH DIFFERENTIAL/PLATELET - Abnormal; Notable for the following components:      Result Value   RBC 3.36 (*)    Hemoglobin 10.4 (*)    HCT 32.8 (*)    All other components within normal limits  COMPREHENSIVE METABOLIC PANEL - Abnormal; Notable for the following components:   Chloride 96 (*)    Glucose, Bld 219 (*)    BUN 28 (*)    Creatinine, Ser 1.49 (*)    GFR calc non Af Amer 34 (*)    GFR calc Af Amer 39 (*)    All other components within normal limits  SARS CORONAVIRUS 2 (HOSPITAL ORDER, [place] [facility])  TROPONIN I  BRAIN NATRIURETIC PEPTIDE  TROPONIN I  TROPONIN I  TROPONIN I   ____________________________________________  EKG ED ECG REPORT I, [place] N BROWN, the attending physician, personally viewed and interpreted this ECG.   Date: 09/22/2018  EKG Time: 6:35 PM  Rate: 79  Rhythm: Normal sinus rhythm  Axis: Normal  Intervals: Normal  ST&T Change: None   ED ECG REPORT I, [place] N BROWN, the attending physician, personally viewed and interpreted this ECG.   Date: 09/22/2018  EKG Time: 1:46 PM  Rate: 67  Rhythm: Normal sinus rhythm  Axis: Normal  Intervals: Normal  ST&T Change: None  ____________________________________________  RADIOLOGY I, [place] N BROWN, personally viewed and evaluated these images (plain radiographs) as part of my medical decision making, as well as reviewing the written report by the radiologist.  ED MD interpretation: Chronic hyperinflation and emphysema noted on chest x-ray without any acute findings per  radiologist.  Official radiology report(s): Dg Chest 1 View  Result Date: 09/23/2018 CLINICAL DATA:  Shortness of breath. Chest tightness. Progressive shortness of breath. EXAM: CHEST  1 VIEW COMPARISON:  Chest radiograph earlier this day. CT 06/17/2018 FINDINGS: Chronic hyperinflation and emphysema. No focal airspace disease. Normal heart size and mediastinal contours. No pulmonary edema, large pleural effusion or pneumothorax. Surgical hardware in the lower cervical spine is partially included. IMPRESSION: Chronic hyperinflation and emphysema.  No acute findings. Electronically Signed   By: Keith Rake M.D.   On: 09/23/2018 00:09   Dg Chest 2 View  Result Date: 09/23/2018 CLINICAL DATA:  Cough and fever.  Nasal congestion.  Nausea. EXAM: CHEST - 2 VIEW COMPARISON:  Chest CT 06/17/2018 FINDINGS: The lungs are hyperinflated. Emphysema with central bronchial thickening. No focal airspace disease. No pulmonary edema, pleural effusion, or pneumothorax. No acute osseous abnormalities are seen. IMPRESSION: Emphysema with central bronchial thickening and hyperinflation, consistent with COPD. No focal airspace disease. Electronically Signed   By: Keith Rake M.D.   On: 09/23/2018 00:10     Procedures   ____________________________________________   INITIAL IMPRESSION / MDM / [place] / ED COURSE  As part of my medical decision making, I reviewed the following data within the electronic MEDICAL RECORD NUMBER   78 year old female presenting with above-stated history and physical exam secondary to chest pain and dyspnea on exertion.  Concern for cardiac versus pulmonary etiology for this patient's symptoms and as such EKG was performed which revealed no evidence of ischemia or infarction.  Laboratory data including troponin negative.  In addition BNP negative.  Patient's chest x-ray consistent with emphysema without any other acute findings per radiologist.  Will obtain COVID-19  testing.   Patient discussed with Dr. Sidney Ace for hospital admission for further evaluation and management given ongoing chest pain.  Patient was given a DuoNeb in the emergency department.     *[name] [name] was evaluated in Emergency Department on 09/23/2018 for the symptoms described in the history of present illness. She was evaluated in the context of the global COVID-19 pandemic, which necessitated consideration that the patient might be at risk for infection with the SARS-CoV-2 virus that causes COVID-19. Institutional protocols and algorithms that pertain to the evaluation of patients at risk for COVID-19 are in a state of rapid change based on information released by regulatory bodies including the CDC and federal and state organizations. These policies and algorithms were followed during the patient's care in the ED.  Some ED evaluations and interventions may be delayed as a result of limited staffing during the pandemic.*       ____________________________________________  FINAL CLINICAL IMPRESSION(S) / ED DIAGNOSES  Final diagnoses:  Chest pain, unspecified type     MEDICATIONS GIVEN DURING THIS VISIT:  Medications  aspirin EC tablet 81 mg (has no administration in time range)  amLODipine (NORVASC) tablet 5 mg (has no administration in time range)  atorvastatin (LIPITOR) tablet 40 mg (has no administration in time range)  isosorbide mononitrate (IMDUR) 24 hr tablet 30 mg (has no administration in time range)  metoprolol tartrate (LOPRESSOR) tablet 25 mg (has no administration in time range)  nitroGLYCERIN (NITROSTAT) SL tablet 0.4 mg (has no administration in time range)  LORazepam (ATIVAN) tablet 1 mg (has no administration in time range)  glimepiride (AMARYL) tablet 2 mg (has no administration in time range)  pantoprazole (PROTONIX) EC tablet 40 mg (has no administration in time range)  clopidogrel (PLAVIX) tablet 75 mg (has no administration in time range)  gabapentin (NEURONTIN)  tablet 300 mg (has no administration in time range)  magnesium oxide (MAG-OX) tablet 400 mg (has no administration in time range)  ICaps CAPS 1 capsule (has no administration in time range)  fluticasone (FLONASE) 50 MCG/ACT nasal spray 2 spray (has no administration in time range)  mometasone (ELOCON) 0.1 % lotion (has no administration in time range)  acetaminophen (TYLENOL) tablet 650 mg (has no administration in time range)  ondansetron (ZOFRAN) injection 4 mg (has no administration in time range)  enoxaparin (LOVENOX) injection 40 mg (has no administration in time range)  morphine 2 MG/ML injection 2 mg (has no administration in time range)  alum & mag hydroxide-simeth (MAALOX/MYLANTA) 200-200-20 MG/5ML suspension 30 mL (has no administration in time range)    And  lidocaine (XYLOCAINE) 2 % viscous mouth solution 15 mL (has no administration in time range)  zolpidem (AMBIEN) tablet 5 mg (has no administration in time range)  ipratropium-albuterol (DUONEB) 0.5-2.5 (3) MG/3ML nebulizer solution 3 mL (3 mLs Nebulization Given 09/23/18 0036)     ED Discharge Orders    None       Note:  This document was prepared using Dragon voice recognition software and may include unintentional dictation errors.   Gregor Hams, MD 09/23/18 [id number]

## 2018-09-23 NOTE — H&P (Signed)
[facility] [place] NAME: [name] [name]    MR#:  [id number]  DATE OF BIRTH:  [birth year]  DATE OF ADMISSION:  09/22/2018  PRIMARY CARE PHYSICIAN: Jerrol Banana., MD   REQUESTING/REFERRING PHYSICIAN: Marjean Donna, MD  CHIEF COMPLAINT:   Chief Complaint  Patient presents with  . Shortness of Breath    HISTORY OF PRESENT ILLNESS:  [name] [name]  is a 78 y.o. Caucasian female with a known history of multiple medical problems that will be mentioned below, who presented to the emergency room with the onset of midsternal chest pain graded 9/10 in severity and felt as pressure with associated diaphoresis as well as dyspnea without nausea or vomiting.  She has been having dyspnea on exertion lately as well as orthopnea for about a week.  Her chest pain has been intermittent over the last week as well.  No fever or chills.  She denied any cough or wheezing.  No recent sick exposures.  Upon presentation to the emergency room approximately was 99% on 2 L O2 by nasal cannula and vital signs otherwise were within normal.  Labs revealed a BUN of 28 and creatinine 1.49 and negative troponin I with BNP of 29 and anemia with hemoglobin of 10.4 hematocrit 32.8, slightly down from 11.3 and 34.5 on 05/07/2018.  She had 2 EKGs one showing normal sinus rhythm with rate of 79 and the other with normal sinus rhythm with a rate of 67 without acute abnormalities.  She was pain-free during her ER stay.  She was given duo nebs.  She will be admitted to an observation telemetry bed for further evaluation and management.   PAST MEDICAL HISTORY:   Past Medical History:  Diagnosis Date  . Abnormal CT lung screening 10/17/2015  . COPD (chronic obstructive pulmonary disease) ([place])   . Coronary artery disease, non-occlusive    a. cath 2006: min nonobs CAD; b. cath 12/2010: cath LAD 50%, RCA 60%; c. 08/2013: Minimal luminal irregs, right dominant system with no  significant CAD, diffuse luminal irregs noted. Normal EF 55%, no AS or MS.   . Diabetes mellitus   . Hyperlipemia    Followed by Dr. Rosanna Randy  . Hypertension   . Lung cancer ([place])   . Macular degeneration    rt  . Personal history of tobacco use, presenting hazards to health 10/15/2015  . Pneumonia    hx  . Shortness of breath   . Stroke [facility])     PAST SURGICAL HISTORY:   Past Surgical History:  Procedure Laterality Date  . ABDOMINAL HYSTERECTOMY    . ANTERIOR CERVICAL DECOMP/DISCECTOMY FUSION N/A 10/19/2013   Procedure: CERVICAL FIVE-SIX ANTERIOR CERVICAL DECOMPRESSION WITH FUSION INTERBODY PROSTHESIS PLATING AND PEEK CAGE;  Surgeon: Ophelia Charter, MD;  Location: [facility];  Service: Neurosurgery;  Laterality: N/A;  . BACK SURGERY  80's  . BREAST CYST EXCISION Left    left negative   . CARDIAC CATHETERIZATION  05/2004  . CATARACT EXTRACTION Left   . CHOLECYSTECTOMY    . ENDARTERECTOMY Left 10/24/2014   Procedure: ENDARTERECTOMY CAROTID;  Surgeon: Algernon Huxley, MD;  Location: [facility] ORS;  Service: Vascular;  Laterality: Left;  . ENDOBRONCHIAL ULTRASOUND N/A 11/14/2015   Procedure: ENDOBRONCHIAL ULTRASOUND;  Surgeon: [name], MD;  Location: [facility] ORS;  Service: Cardiopulmonary;  Laterality: N/A;  . PERIPHERAL VASCULAR CATHETERIZATION N/A 12/04/2015   Procedure: Glori Luis Cath Insertion;  Surgeon: Algernon Huxley, MD;  Location:  [facility];  Service: Cardiovascular;  Laterality: N/A;  . PORTA CATH REMOVAL N/A 05/17/2017   Procedure: PORTA CATH REMOVAL;  Surgeon: Algernon Huxley, MD;  Location: [facility];  Service: Cardiovascular;  Laterality: N/A;  . TONSILLECTOMY AND ADENOIDECTOMY    . VESICOVAGINAL FISTULA CLOSURE W/ TAH      SOCIAL HISTORY:   Social History   Tobacco Use  . Smoking status: Former Smoker    Packs/day: 1.00    Years: 50.00    Pack years: 50.00    Types: Cigarettes    Quit date: 10/03/2015    Years since quitting: 2.9  . Smokeless tobacco:  Never Used  . Tobacco comment: smokes 3 cigs daily 05/06/15. Pt instructed to quit.  Substance Use Topics  . Alcohol use: No    Alcohol/week: 0.0 standard drinks    FAMILY HISTORY:   Family History  Problem Relation Age of Onset  . Stroke Mother        Massive  . Heart attack Father        Massive  . Heart attack Brother   . Heart attack Brother   . Lung cancer Maternal Grandfather   . Heart attack Paternal Grandmother        MI    DRUG ALLERGIES:   Allergies  Allergen Reactions  . Coconut Fatty Acids Swelling and Other (See Comments)    Throat swells    REVIEW OF SYSTEMS:   ROS As per history of present illness. All pertinent systems were reviewed above. Constitutional,  HEENT, cardiovascular, respiratory, GI, GU, musculoskeletal, neuro, psychiatric, endocrine,  integumentary and hematologic systems were reviewed and are otherwise  negative/unremarkable except for positive findings mentioned above in the HPI.   MEDICATIONS AT HOME:   Prior to Admission medications   Medication Sig Start Date End Date Taking? Authorizing Provider  amLODipine (NORVASC) 5 MG tablet Take 1 tablet (5 mg total) by mouth daily. 07/19/18   Jerrol Banana., MD  aspirin EC 81 MG EC tablet Take 1 tablet (81 mg total) by mouth daily. 05/08/18   Mayo, Pete Pelt, MD  atorvastatin (LIPITOR) 40 MG tablet Take 1 tablet (40 mg total) by mouth daily at 6 PM. 07/19/18   Jerrol Banana., MD  Blood Glucose Monitoring Suppl (ONE TOUCH ULTRA 2) w/Device KIT 1 each by Does not apply route See admin instructions. 10/27/17   Jerrol Banana., MD  budesonide-formoterol [facility]) 160-4.5 MCG/ACT inhaler Inhale 2 puffs into the lungs 2 (two) times daily. Patient not taking: Reported on 09/05/2018 09/08/17   Jerrol Banana., MD  clopidogrel (PLAVIX) 75 MG tablet TAKE 1 TABLET BY MOUTH EVERY DAY 08/30/18   Jerrol Banana., MD  fluticasone [facility]) 50 MCG/ACT nasal spray Place 2 sprays  into both nostrils daily. 07/21/17   Bacigalupo, Dionne Bucy, MD  gabapentin (NEURONTIN) 600 MG tablet Take 0.5 tablets (300 mg total) by mouth 2 (two) times daily. 06/02/18   Jerrol Banana., MD  glimepiride (AMARYL) 2 MG tablet Take 1 tablet (2 mg total) by mouth 2 (two) times daily. 06/02/18   Jerrol Banana., MD  isosorbide mononitrate (IMDUR) 30 MG 24 hr tablet TAKE 1 TABLET BY MOUTH  DAILY 04/20/18   Jerrol Banana., MD  LORazepam (ATIVAN) 1 MG tablet Take 1 tablet (1 mg total) by mouth 2 (two) times daily as needed for anxiety. 05/24/17   Jerrol Banana., MD  magnesium oxide (MAG-OX) 400 (241.3 MG) MG tablet Take 400 mg by mouth 2 (two) times daily.  08/04/13   [provider]  metFORMIN (GLUCOPHAGE) 500 MG tablet Take 1 tablet (500 mg total) by mouth 2 (two) times daily with a meal. 07/19/18   Jerrol Banana., MD  metoprolol tartrate (LOPRESSOR) 25 MG tablet Take 1 tablet (25 mg total) by mouth 2 (two) times daily. 06/02/18   Jerrol Banana., MD  mometasone (ELOCON) 0.1 % lotion PLACE 4 DROPS INTO EACH EAR CANAL AT BEDTIME AS NEEDED FOR DRY SKIN/ITCHING/DISCOMFORT. 05/17/18   [provider]  Multiple Vitamins-Minerals (ICAPS) CAPS Take 1 capsule by mouth 2 (two) times daily.     [provider]  nitroGLYCERIN (NITROSTAT) 0.4 MG SL tablet Place 1 tablet (0.4 mg total) under the tongue every 5 (five) minutes as needed for chest pain. 09/22/18   Jerrol Banana., MD  nitroGLYCERIN (NITROSTAT) 0.4 MG SL tablet Place 1 tablet (0.4 mg total) under the tongue every 5 (five) minutes as needed for chest pain. 09/22/18   Jerrol Banana., MD  ONE TOUCH ULTRA TEST test strip USE TO CHECK BLOOD SUGAR ONCE DAILY 10/27/17   Jerrol Banana., MD  pantoprazole (PROTONIX) 40 MG tablet Take 1 tablet (40 mg total) by mouth daily. 06/02/18   Jerrol Banana., MD      VITAL SIGNS:  Blood pressure (!) 119/53, pulse 72, temperature 98.5  F (36.9 C), temperature source Oral, resp. rate 20, height '5\' 4"'  (1.626 m), weight 68.5 kg, SpO2 99 %.  PHYSICAL EXAMINATION:  Physical Exam  GENERAL:  78 y.o.-year-old Caucasian female patient lying in the bed with no acute distress.  EYES: Pupils equal, round, reactive to light and accommodation. No scleral icterus. Extraocular muscles intact.  HEENT: Head atraumatic, normocephalic. Oropharynx and nasopharynx clear.  NECK:  Supple, no jugular venous distention. No thyroid enlargement, no tenderness.  LUNGS: Normal breath sounds bilaterally, no wheezing, rales,rhonchi or crepitation. No use of accessory muscles of respiration.  CARDIOVASCULAR: Regular rate and rhythm, S1, S2 normal. No murmurs, rubs, or gallops.  ABDOMEN: Soft, nondistended, nontender. Bowel sounds present. No organomegaly or mass.  EXTREMITIES: No pedal edema, cyanosis, or clubbing.  NEUROLOGIC: Cranial nerves II through XII are intact. Muscle strength 5/5 in all extremities. Sensation intact. Gait not checked.  PSYCHIATRIC: The patient is alert and oriented x 3.  Normal affect and good eye contact. SKIN: No obvious rash, lesion, or ulcer.   LABORATORY PANEL:   CBC Recent Labs  Lab 09/22/18 1839  WBC 7.0  HGB 10.4*  HCT 32.8*  PLT 284   ------------------------------------------------------------------------------------------------------------------  Chemistries  Recent Labs  Lab 09/22/18 1839  NA 136  K 4.1  CL 96*  CO2 26  GLUCOSE 219*  BUN 28*  CREATININE 1.49*  CALCIUM 9.1  AST 16  ALT 19  ALKPHOS 78  BILITOT 0.4   ------------------------------------------------------------------------------------------------------------------  Cardiac Enzymes Recent Labs  Lab 09/22/18 1839  TROPONINI <0.03   ------------------------------------------------------------------------------------------------------------------  RADIOLOGY:  Dg Chest 1 View  Result Date: 09/23/2018 CLINICAL DATA:   Shortness of breath. Chest tightness. Progressive shortness of breath. EXAM: CHEST  1 VIEW COMPARISON:  Chest radiograph earlier this day. CT 06/17/2018 FINDINGS: Chronic hyperinflation and emphysema. No focal airspace disease. Normal heart size and mediastinal contours. No pulmonary edema, large pleural effusion or pneumothorax. Surgical hardware in the lower cervical spine is partially included. IMPRESSION: Chronic hyperinflation and emphysema.  No  acute findings. Electronically Signed   By: Keith Rake M.D.   On: 09/23/2018 00:09   Dg Chest 2 View  Result Date: 09/23/2018 CLINICAL DATA:  Cough and fever.  Nasal congestion.  Nausea. EXAM: CHEST - 2 VIEW COMPARISON:  Chest CT 06/17/2018 FINDINGS: The lungs are hyperinflated. Emphysema with central bronchial thickening. No focal airspace disease. No pulmonary edema, pleural effusion, or pneumothorax. No acute osseous abnormalities are seen. IMPRESSION: Emphysema with central bronchial thickening and hyperinflation, consistent with COPD. No focal airspace disease. Electronically Signed   By: Keith Rake M.D.   On: 09/23/2018 00:10      IMPRESSION AND PLAN:   1. Chest pain, rule out acute coronary syndrome.  The patient will be admitted to an observation telemetry bed.  Will follow serial cardiac enzymes and EKGs.  We will obtain a cardiology consult in a.m. for further cardiac risk stratification.  The patient will be continued on aspirin and Plavix as well as p.r.n. sublingual nitroglycerin and morphine sulfate for pain. Dr. Humphrey Rolls was notified about the cardiology consult.  2.  Dyspnea with orthopnea.  Her BNP was within normal.  Will defer further work-up to cardiology.  3.  Coronary artery disease.  Aspirin and Plavix and Imdur as well as beta-blocker therapy will be resumed.  4.  Type 2 diabetes mellitus.  She will be placed on supplemental coverage with NovoLog.  5.  Dyslipidemia.  Statin therapy will be resumed.  6.  DVT  prophylaxis.  Subcutaneous Lovenox  All the records are reviewed and case discussed with ED provider. The plan of care was discussed in details with the patient (and family). I answered all questions. The patient agreed to proceed with the above mentioned plan. Further management will depend upon hospital course.   CODE STATUS: Full code  TOTAL TIME TAKING CARE OF THIS PATIENT: 45 minutes.    Christel Mormon M.D on 09/23/2018 at 1:19 AM  Pager - [phone]  After 6pm go to [email or web address] - password [email or web address] [facility]  [facility]  Office  [phone]  CC: Primary care physician; Jerrol Banana., MD   Note: This dictation was prepared with Dragon dictation along with smaller phrase technology. Any transcriptional errors that result from this process are unintentional.

## 2018-09-23 NOTE — Progress Notes (Signed)
[place] [facility]                                                                                                                                                                                  Patient Demographics   [name] [name], is a 78 y.o. female, DOB - January 01, 1941, GBT:[id number]  Admit date - 09/22/2018   Admitting Physician Christel Mormon, MD  Outpatient Primary MD for the patient is Jerrol Banana., MD   LOS - 0  Subjective: Patient states that she is having intermittent chest pain.  No shortness of breath    Review of Systems:   CONSTITUTIONAL: No documented fever. No fatigue, weakness. No weight gain, no weight loss.  EYES: No blurry or double vision.  ENT: No tinnitus. No postnasal drip. No redness of the oropharynx.  RESPIRATORY: No cough, no wheeze, no hemoptysis. No dyspnea.  CARDIOVASCULAR: Positive chest pain. No orthopnea. No palpitations. No syncope.  GASTROINTESTINAL: No nausea, no vomiting or diarrhea. No abdominal pain. No melena or hematochezia.  GENITOURINARY: No dysuria or hematuria.  ENDOCRINE: No polyuria or nocturia. No heat or cold intolerance.  HEMATOLOGY: No anemia. No bruising. No bleeding.  INTEGUMENTARY: No rashes. No lesions.  MUSCULOSKELETAL: No arthritis. No swelling. No gout.  NEUROLOGIC: No numbness, tingling, or ataxia. No seizure-type activity.  PSYCHIATRIC: No anxiety. No insomnia. No ADD.    Vitals:   Vitals:   09/23/18 0300 09/23/18 0330 09/23/18 0437 09/23/18 0755  BP: (!) 129/49 (!) 145/60 (!) 136/58 (!) 120/57  Pulse: 64 65 63 65  Resp: 14 15 16 16   Temp:   97.7 F (36.5 C) 97.8 F (36.6 C)  TempSrc:   Oral Oral  SpO2: 98% 100%  96%  Weight:      Height:        Wt Readings from Last 3 Encounters:  09/22/18 68.5 kg  09/05/18 68.5 kg  06/08/18 68.9 kg    No intake or output data in the 24 hours ending 09/23/18 1256  Physical Exam:   GENERAL: Pleasant-appearing in no apparent  distress.  HEAD, EYES, EARS, NOSE AND THROAT: Atraumatic, normocephalic. Extraocular muscles are intact. Pupils equal and reactive to light. Sclerae anicteric. No conjunctival injection. No oro-pharyngeal erythema.  NECK: Supple. There is no jugular venous distention. No bruits, no lymphadenopathy, no thyromegaly.  HEART: Regular rate and rhythm,. No murmurs, no rubs, no clicks.  LUNGS: Clear to auscultation bilaterally. No rales or rhonchi. No wheezes.  ABDOMEN: Soft, flat, nontender, nondistended. Has good bowel sounds. No hepatosplenomegaly appreciated.  EXTREMITIES: No evidence of any cyanosis, clubbing, or peripheral edema.  +2 pedal and radial pulses bilaterally.  NEUROLOGIC: The patient is alert, awake, and oriented x3 with no focal motor or sensory deficits appreciated bilaterally.  SKIN: Moist and warm with no rashes appreciated.  Psych: Not anxious, depressed LN: No inguinal LN enlargement    Antibiotics   Anti-infectives (From admission, onward)   None      Medications   Scheduled Meds: . alum & mag hydroxide-simeth  30 mL Oral Once   And  . lidocaine  15 mL Oral Once  . amLODipine  5 mg Oral Daily  . aspirin EC  81 mg Oral Daily  . atorvastatin  40 mg Oral q1800  . clopidogrel  75 mg Oral Daily  . enoxaparin (LOVENOX) injection  40 mg Subcutaneous Q24H  . fluticasone  2 spray Each Nare Daily  . gabapentin  300 mg Oral BID  . glimepiride  2 mg Oral BID  . insulin aspart  0-9 Units Subcutaneous TID WC  . isosorbide mononitrate  30 mg Oral Daily  . magnesium oxide  400 mg Oral BID  . metoprolol tartrate  25 mg Oral BID  . mometasone  1 application Topical Daily  . multivitamin-lutein  1 capsule Oral BID  . pantoprazole  40 mg Oral Daily   Continuous Infusions: PRN Meds:.acetaminophen, LORazepam, morphine injection, nitroGLYCERIN, ondansetron (ZOFRAN) IV, zolpidem   Data Review:   Micro Results Recent Results (from the past 240 hour(s))  SARS Coronavirus 2  (CEPHEID- Performed in [place] hospital lab), Hosp Order     Status: None   Collection Time: 09/23/18  9:06 AM   Specimen: Nasopharyngeal Swab  Result Value Ref Range Status   SARS Coronavirus 2 NEGATIVE NEGATIVE Final    Comment: (NOTE) If result is NEGATIVE SARS-CoV-2 target nucleic acids are NOT DETECTED. The SARS-CoV-2 RNA is generally detectable in upper and lower  respiratory specimens during the acute phase of infection. The lowest  concentration of SARS-CoV-2 viral copies this assay can detect is 250  copies / mL. A negative result does not preclude SARS-CoV-2 infection  and should not be used as the sole basis for treatment or other  patient management decisions.  A negative result may occur with  improper specimen collection / handling, submission of specimen other  than nasopharyngeal swab, presence of viral mutation(s) within the  areas targeted by this assay, and inadequate number of viral copies  (<250 copies / mL). A negative result must be combined with clinical  observations, patient history, and epidemiological information. If result is POSITIVE SARS-CoV-2 target nucleic acids are DETECTED. The SARS-CoV-2 RNA is generally detectable in upper and lower  respiratory specimens dur ing the acute phase of infection.  Positive  results are indicative of active infection with SARS-CoV-2.  Clinical  correlation with patient history and other diagnostic information is  necessary to determine patient infection status.  Positive results do  not rule out bacterial infection or co-infection with other viruses. If result is PRESUMPTIVE POSTIVE SARS-CoV-2 nucleic acids MAY BE PRESENT.   A presumptive positive result was obtained on the submitted specimen  and confirmed on repeat testing.  While 2019 novel coronavirus  (SARS-CoV-2) nucleic acids may be present in the submitted sample  additional confirmatory testing may be necessary for epidemiological  and / or clinical  management purposes  to differentiate between  SARS-CoV-2 and other Sarbecovirus currently known to infect humans.  If clinically indicated additional testing with an alternate test  methodology [phone]) is advised. The SARS-CoV-2 RNA is generally  detectable in upper and  lower respiratory sp ecimens during the acute  phase of infection. The expected result is Negative. Fact Sheet for Patients:  StrictlyIdeas.no Fact Sheet for Healthcare Providers: BankingDealers.co.za This test is not yet approved or cleared by the [place] FDA and has been authorized for detection and/or diagnosis of SARS-CoV-2 by FDA under an Emergency Use Authorization (EUA).  This EUA will remain in effect (meaning this test can be used) for the duration of the COVID-19 declaration under Section 564(b)(1) of the Act, 21 U.S.C. section 360bbb-3(b)(1), unless the authorization is terminated or revoked sooner. Performed at [facility], [address] [place],  [address]     Radiology Reports Dg Chest 1 View  Result Date: 09/23/2018 CLINICAL DATA:  Shortness of breath. Chest tightness. Progressive shortness of breath. EXAM: CHEST  1 VIEW COMPARISON:  Chest radiograph earlier this day. CT 06/17/2018 FINDINGS: Chronic hyperinflation and emphysema. No focal airspace disease. Normal heart size and mediastinal contours. No pulmonary edema, large pleural effusion or pneumothorax. Surgical hardware in the lower cervical spine is partially included. IMPRESSION: Chronic hyperinflation and emphysema.  No acute findings. Electronically Signed   By: Keith Rake M.D.   On: 09/23/2018 00:09   Dg Chest 2 View  Result Date: 09/23/2018 CLINICAL DATA:  Cough and fever.  Nasal congestion.  Nausea. EXAM: CHEST - 2 VIEW COMPARISON:  Chest CT 06/17/2018 FINDINGS: The lungs are hyperinflated. Emphysema with central bronchial thickening. No focal airspace disease. No  pulmonary edema, pleural effusion, or pneumothorax. No acute osseous abnormalities are seen. IMPRESSION: Emphysema with central bronchial thickening and hyperinflation, consistent with COPD. No focal airspace disease. Electronically Signed   By: Keith Rake M.D.   On: 09/23/2018 00:10     CBC Recent Labs  Lab 09/22/18 1839  WBC 7.0  HGB 10.4*  HCT 32.8*  PLT 284  MCV 97.6  MCH 31.0  MCHC 31.7  RDW 14.8  LYMPHSABS 0.9  MONOABS 0.6  EOSABS 0.2  BASOSABS 0.0    Chemistries  Recent Labs  Lab 09/22/18 1839  NA 136  K 4.1  CL 96*  CO2 26  GLUCOSE 219*  BUN 28*  CREATININE 1.49*  CALCIUM 9.1  AST 16  ALT 19  ALKPHOS 78  BILITOT 0.4   ------------------------------------------------------------------------------------------------------------------ estimated creatinine clearance is 30.5 mL/min (A) (by C-G formula based on SCr of 1.49 mg/dL (H)). ------------------------------------------------------------------------------------------------------------------ No results for input(s): HGBA1C in the last 72 hours. ------------------------------------------------------------------------------------------------------------------ No results for input(s): CHOL, HDL, LDLCALC, TRIG, CHOLHDL, LDLDIRECT in the last 72 hours. ------------------------------------------------------------------------------------------------------------------ No results for input(s): TSH, T4TOTAL, T3FREE, THYROIDAB in the last 72 hours.  Invalid input(s): FREET3 ------------------------------------------------------------------------------------------------------------------ No results for input(s): VITAMINB12, FOLATE, FERRITIN, TIBC, IRON, RETICCTPCT in the last 72 hours.  Coagulation profile No results for input(s): INR, PROTIME in the last 168 hours.  No results for input(s): DDIMER in the last 72 hours.  Cardiac Enzymes Recent Labs  Lab 09/22/18 1839 09/23/18 0500 09/23/18 0926   TROPONINI <0.03 <0.03 <0.03   ------------------------------------------------------------------------------------------------------------------ Invalid input(s): POCBNP    Assessment & Plan   1. Chest pain, rule out acute coronary syndrome. . Continue aspirin Plavix as well as nitroglycerin Has been seen by cardiology there will see outpatient for further evaluation Patient admitted early and would like to be monitored today in the hospital  2.  Dyspnea with orthopnea.  Her BNP was within normal.  Will defer further work-up to cardiology.  Patient had a echo in February which showed no significant wall motion abnormality.  3.  Coronary artery disease.  Aspirin and Plavix and Imdur as well as beta-blocker .  4.  Type 2 diabetes mellitus.    Continue sliding scale insulin  5.  Dyslipidemia.  Statin therapy will be resumed.  6.  DVT prophylaxis.  Subcutaneous Lovenox      Code Status Orders  (From admission, onward)         Start     Ordered   09/23/18 1035  Do not attempt resuscitation (DNR)  Continuous    Question Answer Comment  In the event of cardiac or respiratory ARREST Do not call a "code blue"   In the event of cardiac or respiratory ARREST Do not perform Intubation, CPR, defibrillation or ACLS   In the event of cardiac or respiratory ARREST Use medication by any route, position, wound care, and other measures to relive pain and suffering. May use oxygen, suction and manual treatment of airway obstruction as needed for comfort.      09/23/18 1034        Code Status History    Date Active Date Inactive Code Status Order ID Comments User Context   09/23/2018 0327 09/23/2018 1034 Full Code [id number]  Mansy, Arvella Merles, MD ED   05/06/2018 1439 05/07/2018 1739 Full Code [id number]  Gladstone Lighter, MD ED   03/30/2017 1959 04/03/2017 1402 Full Code [id number]  Demetrios Loll, MD Inpatient   07/15/2016 0403 07/16/2016 1858 Full Code [id number]  Harrie Foreman, MD Inpatient    02/05/2016 1510 02/13/2016 1811 Full Code [id number]  Henreitta Leber, MD Inpatient   01/27/2015 2148 01/29/2015 1759 Full Code [id number]  Hower, Aaron Mose, MD ED   10/24/2014 1203 10/25/2014 1626 Full Code [id number]  Algernon Huxley, MD Inpatient   08/30/2014 0105 08/30/2014 1948 Full Code [id number]  Lance Coon, MD Inpatient   10/19/2013 1619 10/20/2013 1604 Full Code [id number]  Newman Pies, MD Inpatient   Advance Care Planning Activity           Consults cardiology  DVT Prophylaxis  Lovenox  Lab Results  Component Value Date   PLT 284 09/22/2018     Time Spent in minutes  71min  Greater than 50% of time spent in care coordination and counseling patient regarding the condition and plan of care.   Dustin Flock M.D on 09/23/2018 at 12:56 PM  Between 7am to 6pm - Pager - [phone]  After 6pm go to [email or web address] - Proofreader  [employer]   Office  [phone]

## 2018-09-23 NOTE — Progress Notes (Signed)
Ch visited pt regarding prayer request OR. Pt is a 78 y.o. Caucasian married female with a hx of oxygen dependency, diabetes, and heart problems. Pt was just recently transferred to 2A from ED when ch visited. Pt shared that she has not eaten since yesterday morning and her indigestion that she has been dealing with for a while has limited the types of foods she eats. The main complaint the pt had was the chest pain. Pt also shared her frustration for not being allowed to hv a family member at bedside. Ch allowed space for pt to share her frustration and explained that it was a means of protecting both the pt and family members due to the COVID-19 restrictions. Ch was present while pt was having her blood drawn by the tech. Ch prayed w/ pt regarding her health, for care providers, and for her husband that she hopes to be reunited w/ soon. Pt appreciated visit.   F/u care should be attending progressive rounds to get insight on how the IDT will be moving forward w/ the pt care plan.    09/23/18 0900  Clinical Encounter Type  Visited With Patient;Health care provider  Visit Type Psychological support;Spiritual support;Social support  Referral From Nurse  Consult/Referral To Chaplain  Spiritual Encounters  Spiritual Needs Prayer;Emotional;Grief support  Stress Factors  Patient Stress Factors Exhausted;Health changes;Loss of control;Major life changes  Family Stress Factors Loss of control

## 2018-09-23 NOTE — ED Notes (Signed)
ED TO INPATIENT HANDOFF REPORT  ED Nurse Name and Phone #: Antonieta Pert, rn  S Name/Age/Gender Miel D Luft 78 y.o. female Room/Bed: ED25A/ED25A  Code Status   Code Status: Full Code  Home/SNF/Other Home Patient oriented to: self, place, time and situation Is this baseline? Yes   Triage Complete: Triage complete  Chief Complaint [facility]  EMS  Triage Note PT arrives from home via ems with concerns over chest tightness for the last week. Pt had XRAY at PCP today but has pending results. Pt also concerns over increased shortness of breath. Pt states she "could not move" due to feeling exertionally short of breath. Pt wears supplemental oxygen at night. Pt's room air saturation 92% in triage.    Allergies Allergies  Allergen Reactions  . Coconut Fatty Acids Swelling and Other (See Comments)    Throat swells    Level of Care/Admitting Diagnosis ED Disposition    ED Disposition Condition Comment   Admit  Hospital Area: [place] [[id number]]  Level of Care: Telemetry [5]  Covid Evaluation: Screening Protocol (No Symptoms)  Diagnosis: Chest pain [825053]  Admitting Physician: Christel Mormon [[id number]]  Attending Physician: Christel Mormon [[id number]]  PT Class (Do Not Modify): Observation [104]  PT Acc Code (Do Not Modify): Observation [10022]       B Medical/Surgery History Past Medical History:  Diagnosis Date  . Abnormal CT lung screening 10/17/2015  . COPD (chronic obstructive pulmonary disease) ([place])   . Coronary artery disease, non-occlusive    a. cath 2006: min nonobs CAD; b. cath 12/2010: cath LAD 50%, RCA 60%; c. 08/2013: Minimal luminal irregs, right dominant system with no significant CAD, diffuse luminal irregs noted. Normal EF 55%, no AS or MS.   . Diabetes mellitus   . Hyperlipemia    Followed by Dr. Rosanna Randy  . Hypertension   . Lung cancer ([place])   . Macular degeneration    rt  . Personal history of tobacco use, presenting hazards to health  10/15/2015  . Pneumonia    hx  . Shortness of breath   . Stroke [facility])    Past Surgical History:  Procedure Laterality Date  . ABDOMINAL HYSTERECTOMY    . ANTERIOR CERVICAL DECOMP/DISCECTOMY FUSION N/A 10/19/2013   Procedure: CERVICAL FIVE-SIX ANTERIOR CERVICAL DECOMPRESSION WITH FUSION INTERBODY PROSTHESIS PLATING AND PEEK CAGE;  Surgeon: Ophelia Charter, MD;  Location: [facility];  Service: Neurosurgery;  Laterality: N/A;  . BACK SURGERY  80's  . BREAST CYST EXCISION Left    left negative   . CARDIAC CATHETERIZATION  05/2004  . CATARACT EXTRACTION Left   . CHOLECYSTECTOMY    . ENDARTERECTOMY Left 10/24/2014   Procedure: ENDARTERECTOMY CAROTID;  Surgeon: Algernon Huxley, MD;  Location: [facility] ORS;  Service: Vascular;  Laterality: Left;  . ENDOBRONCHIAL ULTRASOUND N/A 11/14/2015   Procedure: ENDOBRONCHIAL ULTRASOUND;  Surgeon: [name], MD;  Location: [facility] ORS;  Service: Cardiopulmonary;  Laterality: N/A;  . PERIPHERAL VASCULAR CATHETERIZATION N/A 12/04/2015   Procedure: Glori Luis Cath Insertion;  Surgeon: Algernon Huxley, MD;  Location: [facility] CV LAB;  Service: Cardiovascular;  Laterality: N/A;  . PORTA CATH REMOVAL N/A 05/17/2017   Procedure: PORTA CATH REMOVAL;  Surgeon: Algernon Huxley, MD;  Location: [facility] CV LAB;  Service: Cardiovascular;  Laterality: N/A;  . TONSILLECTOMY AND ADENOIDECTOMY    . VESICOVAGINAL FISTULA CLOSURE W/ TAH       A IV Location/Drains/Wounds Patient Lines/Drains/Airways Status   Active Line/Drains/Airways  Name:   Placement date:   Placement time:   Site:   Days:   External Urinary Catheter   05/06/18    1645    -   140          Intake/Output Last 24 hours No intake or output data in the 24 hours ending 09/23/18 0353  Labs/Imaging Results for orders placed or performed during the hospital encounter of 09/22/18 (from the past 48 hour(s))  CBC with Differential     Status: Abnormal   Collection Time: 09/22/18  6:39 PM  Result Value Ref Range    WBC 7.0 4.0 - 10.5 K/uL   RBC 3.36 (L) 3.87 - 5.11 MIL/uL   Hemoglobin 10.4 (L) 12.0 - 15.0 g/dL   HCT 32.8 (L) 36.0 - 46.0 %   MCV 97.6 80.0 - 100.0 fL   MCH 31.0 26.0 - 34.0 pg   MCHC 31.7 30.0 - 36.0 g/dL   RDW 14.8 11.5 - 15.5 %   Platelets 284 150 - 400 K/uL   nRBC 0.0 0.0 - 0.2 %   Neutrophils Relative % 76 %   Neutro Abs 5.3 1.7 - 7.7 K/uL   Lymphocytes Relative 13 %   Lymphs Abs 0.9 0.7 - 4.0 K/uL   Monocytes Relative 8 %   Monocytes Absolute 0.6 0.1 - 1.0 K/uL   Eosinophils Relative 2 %   Eosinophils Absolute 0.2 0.0 - 0.5 K/uL   Basophils Relative 0 %   Basophils Absolute 0.0 0.0 - 0.1 K/uL   Immature Granulocytes 1 %   Abs Immature Granulocytes 0.04 0.00 - 0.07 K/uL    Comment: Performed at [facility], [address] [place], [place] [address]  Comprehensive metabolic panel     Status: Abnormal   Collection Time: 09/22/18  6:39 PM  Result Value Ref Range   Sodium 136 135 - 145 mmol/L   Potassium 4.1 3.5 - 5.1 mmol/L   Chloride 96 (L) 98 - 111 mmol/L   CO2 26 22 - 32 mmol/L   Glucose, Bld 219 (H) 70 - 99 mg/dL   BUN 28 (H) 8 - 23 mg/dL   Creatinine, Ser 1.49 (H) 0.44 - 1.00 mg/dL   Calcium 9.1 8.9 - 10.3 mg/dL   Total Protein 7.4 6.5 - 8.1 g/dL   Albumin 3.7 3.5 - 5.0 g/dL   AST 16 15 - 41 U/L   ALT 19 0 - 44 U/L   Alkaline Phosphatase 78 38 - 126 U/L   Total Bilirubin 0.4 0.3 - 1.2 mg/dL   GFR calc non Af Amer 34 (L) >60 mL/min   GFR calc Af Amer 39 (L) >60 mL/min   Anion gap 14 5 - 15    Comment: Performed at [facility], [address] [place], [place] [address]  Troponin I - ONCE - STAT     Status: None   Collection Time: 09/22/18  6:39 PM  Result Value Ref Range   Troponin I <0.03 <0.03 ng/mL    Comment: Performed at [facility], [address] [place], [place] [address]  Brain natriuretic peptide     Status: None   Collection Time: 09/22/18  6:39 PM  Result Value Ref Range   B Natriuretic Peptide 29.0 0.0 -  100.0 pg/mL    Comment: Performed at [facility], [address], [place], [place] [address]   Dg Chest 1 View  Result Date: 09/23/2018 CLINICAL DATA:  Shortness of breath. Chest tightness. Progressive  shortness of breath. EXAM: CHEST  1 VIEW COMPARISON:  Chest radiograph earlier this day. CT 06/17/2018 FINDINGS: Chronic hyperinflation and emphysema. No focal airspace disease. Normal heart size and mediastinal contours. No pulmonary edema, large pleural effusion or pneumothorax. Surgical hardware in the lower cervical spine is partially included. IMPRESSION: Chronic hyperinflation and emphysema.  No acute findings. Electronically Signed   By: Keith Rake M.D.   On: 09/23/2018 00:09   Dg Chest 2 View  Result Date: 09/23/2018 CLINICAL DATA:  Cough and fever.  Nasal congestion.  Nausea. EXAM: CHEST - 2 VIEW COMPARISON:  Chest CT 06/17/2018 FINDINGS: The lungs are hyperinflated. Emphysema with central bronchial thickening. No focal airspace disease. No pulmonary edema, pleural effusion, or pneumothorax. No acute osseous abnormalities are seen. IMPRESSION: Emphysema with central bronchial thickening and hyperinflation, consistent with COPD. No focal airspace disease. Electronically Signed   By: Keith Rake M.D.   On: 09/23/2018 00:10    Pending Labs Unresulted Labs (From admission, onward)    Start     Ordered   09/23/18 0327  Troponin I - Now Then Q3H  Now then every 3 hours,   STAT     09/23/18 0327   09/22/18 2335  SARS Coronavirus 2 (CEPHEID- Performed in [place] hospital lab), Hosp Order  (Symptomatic Patients Labs with Precautions )  Once,   STAT     09/22/18 2334          Vitals/Pain Today's Vitals   09/23/18 0230 09/23/18 0245 09/23/18 0300 09/23/18 0330  BP: (!) 129/50  (!) 129/49 (!) 145/60  Pulse: 68 64 64 65  Resp: 14 17 14 15   Temp:      TempSrc:      SpO2: 98% 99% 98% 100%  Weight:      Height:      PainSc:        Isolation Precautions Droplet  and Contact precautions  Medications Medications  aspirin EC tablet 81 mg (has no administration in time range)  amLODipine (NORVASC) tablet 5 mg (has no administration in time range)  atorvastatin (LIPITOR) tablet 40 mg (has no administration in time range)  isosorbide mononitrate (IMDUR) 24 hr tablet 30 mg (has no administration in time range)  metoprolol tartrate (LOPRESSOR) tablet 25 mg (has no administration in time range)  nitroGLYCERIN (NITROSTAT) SL tablet 0.4 mg (has no administration in time range)  LORazepam (ATIVAN) tablet 1 mg (has no administration in time range)  glimepiride (AMARYL) tablet 2 mg (has no administration in time range)  pantoprazole (PROTONIX) EC tablet 40 mg (has no administration in time range)  clopidogrel (PLAVIX) tablet 75 mg (has no administration in time range)  gabapentin (NEURONTIN) tablet 300 mg (has no administration in time range)  magnesium oxide (MAG-OX) tablet 400 mg (has no administration in time range)  ICaps CAPS 1 capsule (has no administration in time range)  fluticasone (FLONASE) 50 MCG/ACT nasal spray 2 spray (has no administration in time range)  mometasone (ELOCON) 0.1 % lotion (has no administration in time range)  acetaminophen (TYLENOL) tablet 650 mg (has no administration in time range)  ondansetron (ZOFRAN) injection 4 mg (has no administration in time range)  enoxaparin (LOVENOX) injection 40 mg (has no administration in time range)  morphine 2 MG/ML injection 2 mg (has no administration in time range)  alum & mag hydroxide-simeth (MAALOX/MYLANTA) 200-200-20 MG/5ML suspension 30 mL (has no administration in time range)    And  lidocaine (XYLOCAINE) 2 % viscous mouth solution 15  mL (has no administration in time range)  zolpidem (AMBIEN) tablet 5 mg (has no administration in time range)  ipratropium-albuterol (DUONEB) 0.5-2.5 (3) MG/3ML nebulizer solution 3 mL (3 mLs Nebulization Given 09/23/18 0036)    Mobility manual  wheelchair Moderate fall risk   Focused Assessments Cardiac Assessment Handoff:    Lab Results  Component Value Date   CKTOTAL 49 09/21/2017   TROPONINI <0.03 09/22/2018   No results found for: DDIMER Does the Patient currently have chest pain? No  , Pulmonary Assessment Handoff:  Lung sounds: Bilateral Breath Sounds: Rhonchi L Breath Sounds: Rhonchi R Breath Sounds: Rhonchi O2 Device: Nasal Cannula O2 Flow Rate (L/min): 3 L/min      R Recommendations: See Admitting Provider Note  Report given to:   Additional Notes:

## 2018-09-23 NOTE — Care Management Obs Status (Signed)
[place] NOTIFICATION   Patient Details  Name: [name] [name] MRN: [id number] Date of Birth: [birth year]   Medicare Observation Status Notification Given:  Yes    Cecil Cobbs 09/23/2018, 5:13 PM

## 2018-09-23 NOTE — Plan of Care (Signed)
  Problem: Clinical Measurements: Goal: Cardiovascular complication will be avoided Outcome: Progressing   Problem: Pain Managment: Goal: General experience of comfort will improve Outcome: Progressing   Problem: Cardiac: Goal: Ability to achieve and maintain adequate cardiovascular perfusion will improve Outcome: Progressing

## 2018-09-23 NOTE — Progress Notes (Signed)
Advanced care plan.  Purpose of the Encounter: CODE STATUS  Parties in Attendance: Patient herself  Patient's Decision Capacity: Intact Subjective/Patient's story: [name] [name]  is a 78 y.o. Caucasian female with a known history of multiple medical problems that will be mentioned below, who presented to the emergency room with the onset of midsternal chest pain graded 9/10 in severity and felt as pressure with associated diaphoresis as well as dyspnea without nausea or vomiting.  She has been having dyspnea on exertion lately as well as orthopnea for about a week.     Objective/Medical story I discussed with the patient regarding her desires for cardiac and pulmonary resuscitation.  I also asked her about her living will and healthcare power of attorney.   Goals of care determination:  Patient states that she wants to be a DNR.  So she is in the process of creating a living will and healthcare power of attorney.   CODE STATUS: DNR   Time spent discussing advanced care planning: 16 minutes

## 2018-09-23 NOTE — Evaluation (Addendum)
Physical Therapy Evaluation Patient Details Name: [name] [name] MRN: [id number] DOB: 1940-07-15 Today's Date: 09/23/2018   History of Present Illness  presented to ER secondary to midsternal chest pain, SOB; admitted for management of chest pain.  ACS ruled out per cardiology; troponins negative.  Clinical Impression  Upon evaluation, patient alert and oriented; follows commands and agreeable to session with min encouragement from therapist.  Patient generally weak and deconditioned; denies pain or chest discomfort at this time.  Currently requiring min assist for bed mobility; cga/min assist for sit/stand, basic transfers and gait (20' x2) with RW.  Progressive fatigue/weakness noted with upright/standing position; suspect positive orthostasis, though unable to fully assess due to patient toileting needs.  Will re-assess in subsequent sessions; RN informed/aware. Of note, HR stable in 70-80s throughout session; responds appropriately to activity.  Denies chest pain or discomfort throughout session.  Noted desat to 87% with minimal exertion on RA, requiring seated rest and 30-60 seconds pursed lip breathing for recovery >90%.  RN informed/aware. Would benefit from skilled PT to address above deficits and promote optimal return to PLOF;d Recommend transition to HHPT upon discharge from acute hospitalization.     Follow Up Recommendations Home health PT    Equipment Recommendations       Recommendations for Other Services       Precautions / Restrictions Precautions Precautions: Fall Restrictions Weight Bearing Restrictions: No      Mobility  Bed Mobility Overal bed mobility: Needs Assistance Bed Mobility: Supine to Sit     Supine to sit: Min assist     General bed mobility comments: assist for truncal elevation  Transfers Overall transfer level: Needs assistance Equipment used: Rolling walker (2 wheeled) Transfers: Sit to/from Stand Sit to Stand: Min guard;Min assist          General transfer comment: tends to pull up on RW despite cuing; increased time/effort with broad BOS  Ambulation/Gait Ambulation/Gait assistance: Min guard;Min assist Gait Distance (Feet): (20x2) Assistive device: Rolling walker (2 wheeled)       General Gait Details: broad BOS, forward flexed posture with RW arms length anterior to patient; slow and guarded, generally weak and unsteady  Stairs            Wheelchair Mobility    Modified Rankin (Stroke Patients Only)       Balance Overall balance assessment: Needs assistance Sitting-balance support: No upper extremity supported;Feet supported Sitting balance-Leahy Scale: Fair     Standing balance support: Bilateral upper extremity supported Standing balance-Leahy Scale: Poor                               Pertinent Vitals/Pain Pain Assessment: No/denies pain    Home Living Family/patient expects to be discharged to:: Private residence Living Arrangements: Spouse/significant other Available Help at Discharge: Family   Home Access: Stairs to enter Entrance Stairs-Rails: Right;Left;Can reach both Technical brewer of Steps: 6 Home Layout: One level        Prior Function Level of Independence: Independent with assistive device(s)         Comments: Mod indep with SPC for ADLs, household and community mobilization; recently completed course outpatient PT (ending 07/2018).  Does endorse at least falls within previous 6 months.     Hand Dominance   Dominant Hand: Right    Extremity/Trunk Assessment   Upper Extremity Assessment Upper Extremity Assessment: Generalized weakness    Lower Extremity Assessment Lower Extremity Assessment:  Generalized weakness(grossly at least 4-/5 throughout)       Communication   Communication: No difficulties  Cognition Arousal/Alertness: Awake/alert Behavior During Therapy: WFL for tasks assessed/performed Overall Cognitive Status: Within  Functional Limits for tasks assessed                                        General Comments      Exercises Other Exercises Other Exercises: Toilet transfer, ambulatory with RW, min assist; cuing for walker position, management.  Very effortful and deliberate wiht gait efforts; generally weak and unsteady.  Suspect positive orthostasis; however, patient insistent on toiletin; unable to get accurate standing assessment. Other Exercises: Standing balance at sink for hand hygiene, min assist; postural extension/trunk control deteriorates with fatigue. Consistent cuing for postural correction and overall safety.   Assessment/Plan    PT Assessment Patient needs continued PT services  PT Problem List Decreased strength;Decreased activity tolerance;Decreased mobility;Decreased safety awareness;Cardiopulmonary status limiting activity;Decreased range of motion;Decreased balance;Decreased coordination;Decreased knowledge of use of DME;Decreased knowledge of precautions       PT Treatment Interventions DME instruction;Stair training;Therapeutic activities;Balance training;Gait training;Functional mobility training;Therapeutic exercise;Patient/family education    PT Goals (Current goals can be found in the Care Plan section)  Acute Rehab PT Goals Patient Stated Goal: to get stronger PT Goal Formulation: With patient Time For Goal Achievement: 10/07/18 Potential to Achieve Goals: Good    Frequency Min 2X/week   Barriers to discharge        Co-evaluation               AM-PAC PT "6 Clicks" Mobility  Outcome Measure Help needed turning from your back to your side while in a flat bed without using bedrails?: A Little Help needed moving from lying on your back to sitting on the side of a flat bed without using bedrails?: A Little Help needed moving to and from a bed to a chair (including a wheelchair)?: A Little Help needed standing up from a chair using your arms (e.g.,  wheelchair or bedside chair)?: A Little Help needed to walk in hospital room?: A Little Help needed climbing 3-5 steps with a railing? : A Little 6 Click Score: 18    End of Session Equipment Utilized During Treatment: Gait belt Activity Tolerance: Patient limited by fatigue Patient left: in bed;with bed alarm set;with call bell/phone within reach Nurse Communication: Mobility status PT Visit Diagnosis: Unsteadiness on feet (R26.81);Muscle weakness (generalized) (M62.81);Difficulty in walking, not elsewhere classified (R26.2)    Time: 9678-9381 PT Time Calculation (min) (ACUTE ONLY): 33 min   Charges:   PT Evaluation $PT Eval Moderate Complexity: 1 Mod PT Treatments $Therapeutic Activity: 8-22 mins       Franci Oshana H. Owens Shark, PT, DPT, NCS 09/23/18, 8:28 PM [phone]

## 2018-09-23 NOTE — Consult Note (Addendum)
[name] [name] is a 78 y.o. female  [id number]  Primary Cardiologist: New Patient to Dr. Neoma Laming  Reason for Consultation: Chest pain  HPI: 78yo caucasian female with a known medical history of CAD, COPD on home oxygen of 3L, HTN, lung cancer, and stroke in addition to other chronic conditions noted below presented to the ER with 9/10 chest pain with diaphoresis and dyspnea. Dyspnea with exertion present for 1 week. No acute changes on her EKG, Troponin negative x 3, BNP is WNL, and her chest pain resolved when she was admitted to telemetry.    Review of Systems: Currently feeling well. No chest pain. Shortness of breath at baseline.   Past Medical History:  Diagnosis Date  . Abnormal CT lung screening 10/17/2015  . COPD (chronic obstructive pulmonary disease) ([place])   . Coronary artery disease, non-occlusive    a. cath 2006: min nonobs CAD; b. cath 12/2010: cath LAD 50%, RCA 60%; c. 08/2013: Minimal luminal irregs, right dominant system with no significant CAD, diffuse luminal irregs noted. Normal EF 55%, no AS or MS.   . Diabetes mellitus   . Hyperlipemia    Followed by Dr. Rosanna Randy  . Hypertension   . Lung cancer ([place])   . Macular degeneration    rt  . Personal history of tobacco use, presenting hazards to health 10/15/2015  . Pneumonia    hx  . Shortness of breath   . Stroke [facility])     Medications Prior to Admission  Medication Sig Dispense Refill  . amLODipine (NORVASC) 5 MG tablet Take 1 tablet (5 mg total) by mouth daily. 90 tablet 3  . aspirin EC 81 MG EC tablet Take 1 tablet (81 mg total) by mouth daily. 30 tablet 0  . atorvastatin (LIPITOR) 40 MG tablet Take 1 tablet (40 mg total) by mouth daily at 6 PM. 90 tablet 3  . clopidogrel (PLAVIX) 75 MG tablet TAKE 1 TABLET BY MOUTH EVERY DAY 90 tablet 1  . fluticasone (FLONASE) 50 MCG/ACT nasal spray Place 2 sprays into both nostrils daily. 16 g 6  . gabapentin (NEURONTIN) 600 MG tablet Take 0.5 tablets (300 mg total) by  mouth 2 (two) times daily. 180 tablet 3  . glimepiride (AMARYL) 2 MG tablet Take 1 tablet (2 mg total) by mouth 2 (two) times daily. 180 tablet 3  . isosorbide mononitrate (IMDUR) 30 MG 24 hr tablet TAKE 1 TABLET BY MOUTH  DAILY 90 tablet 3  . LORazepam (ATIVAN) 1 MG tablet Take 1 tablet (1 mg total) by mouth 2 (two) times daily as needed for anxiety. 30 tablet 5  . magnesium oxide (MAG-OX) 400 (241.3 MG) MG tablet Take 400 mg by mouth 2 (two) times daily.     . metFORMIN (GLUCOPHAGE) 500 MG tablet Take 1 tablet (500 mg total) by mouth 2 (two) times daily with a meal. 180 tablet 3  . metoprolol tartrate (LOPRESSOR) 25 MG tablet Take 1 tablet (25 mg total) by mouth 2 (two) times daily. 180 tablet 3  . Multiple Vitamins-Minerals (ICAPS) CAPS Take 1 capsule by mouth 2 (two) times daily.     . nitroGLYCERIN (NITROSTAT) 0.4 MG SL tablet Place 1 tablet (0.4 mg total) under the tongue every 5 (five) minutes as needed for chest pain. 25 tablet 3  . pantoprazole (PROTONIX) 40 MG tablet Take 1 tablet (40 mg total) by mouth daily. 90 tablet 3  . Blood Glucose Monitoring Suppl (ONE TOUCH ULTRA 2) w/Device KIT 1  each by Does not apply route See admin instructions. 1 each 0  . budesonide-formoterol (SYMBICORT) 160-4.5 MCG/ACT inhaler Inhale 2 puffs into the lungs 2 (two) times daily. (Patient not taking: Reported on 09/05/2018) 3 Inhaler 3  . mometasone (ELOCON) 0.1 % lotion PLACE 4 DROPS INTO EACH EAR CANAL AT BEDTIME AS NEEDED FOR DRY SKIN/ITCHING/DISCOMFORT.    . nitroGLYCERIN (NITROSTAT) 0.4 MG SL tablet Place 1 tablet (0.4 mg total) under the tongue every 5 (five) minutes as needed for chest pain. 25 tablet 11  . ONE TOUCH ULTRA TEST test strip USE TO CHECK BLOOD SUGAR ONCE DAILY 100 each 11     . alum & mag hydroxide-simeth  30 mL Oral Once   And  . lidocaine  15 mL Oral Once  . amLODipine  5 mg Oral Daily  . aspirin EC  81 mg Oral Daily  . atorvastatin  40 mg Oral q1800  . clopidogrel  75 mg Oral  Daily  . enoxaparin (LOVENOX) injection  40 mg Subcutaneous Q24H  . fluticasone  2 spray Each Nare Daily  . gabapentin  300 mg Oral BID  . glimepiride  2 mg Oral BID  . insulin aspart  0-9 Units Subcutaneous TID WC  . isosorbide mononitrate  30 mg Oral Daily  . magnesium oxide  400 mg Oral BID  . metoprolol tartrate  25 mg Oral BID  . mometasone  1 application Topical Daily  . multivitamin-lutein  1 capsule Oral BID  . pantoprazole  40 mg Oral Daily    Infusions:   Allergies  Allergen Reactions  . Coconut Fatty Acids Swelling and Other (See Comments)    Throat swells    Social History   Socioeconomic History  . Marital status: Married    Spouse name: Not on file  . Number of children: 4  . Years of education: Not on file  . Highest education level: 11th grade  Occupational History  . Occupation: Retired, Licensed conveyancer: RETIRED  Social Needs  . Financial resource strain: Somewhat hard  . Food insecurity    Worry: Never true    Inability: Never true  . Transportation needs    Medical: No    Non-medical: No  Tobacco Use  . Smoking status: Former Smoker    Packs/day: 1.00    Years: 50.00    Pack years: 50.00    Types: Cigarettes    Quit date: 10/03/2015    Years since quitting: 2.9  . Smokeless tobacco: Never Used  . Tobacco comment: smokes 3 cigs daily 05/06/15. Pt instructed to quit.  Substance and Sexual Activity  . Alcohol use: No    Alcohol/week: 0.0 standard drinks  . Drug use: No  . Sexual activity: Never  Lifestyle  . Physical activity    Days per week: 0 days    Minutes per session: 0 min  . Stress: Not at all  Relationships  . Social Herbalist on phone: Patient refused    Gets together: Patient refused    Attends religious service: Patient refused    Active member of club or organization: Patient refused    Attends meetings of clubs or organizations: Patient refused    Relationship status: Patient refused  . Intimate  partner violence    Fear of current or ex partner: Patient refused    Emotionally abused: Patient refused    Physically abused: Patient refused    Forced sexual activity: Patient refused  Other Topics Concern  . Not on file  Social History Narrative   Married with 4 children   Gets regular exercise   Lives at home with her husband.  Ambulates with a cane.    Family History  Problem Relation Age of Onset  . Stroke Mother        Massive  . Heart attack Father        Massive  . Heart attack Brother   . Heart attack Brother   . Lung cancer Maternal Grandfather   . Heart attack Paternal Grandmother        MI    PHYSICAL EXAM: Vitals:   09/23/18 0437 09/23/18 0755  BP: (!) 136/58 (!) 120/57  Pulse: 63 65  Resp: 16 16  Temp: 97.7 F (36.5 C) 97.8 F (36.6 C)  SpO2:  96%    No intake or output data in the 24 hours ending 09/23/18 0919  General:  Well appearing. No respiratory difficulty HEENT: normal Neck: supple. no JVD. Carotids 2+ bilat; no bruits. No lymphadenopathy or thryomegaly appreciated. Cor: PMI nondisplaced. Regular rate & rhythm. No rubs, gallops or murmurs. Lungs: clear Abdomen: soft, nontender, nondistended. No hepatosplenomegaly. No bruits or masses. Good bowel sounds. Extremities: no cyanosis, clubbing, rash, edema Neuro: alert & oriented x 3, cranial nerves grossly intact. moves all 4 extremities w/o difficulty. Affect pleasant.  ECG: NSR 67bpm,  Borderline short PR interval  Results for orders placed or performed during the hospital encounter of 09/22/18 (from the past 24 hour(s))  CBC with Differential     Status: Abnormal   Collection Time: 09/22/18  6:39 PM  Result Value Ref Range   WBC 7.0 4.0 - 10.5 K/uL   RBC 3.36 (L) 3.87 - 5.11 MIL/uL   Hemoglobin 10.4 (L) 12.0 - 15.0 g/dL   HCT 32.8 (L) 36.0 - 46.0 %   MCV 97.6 80.0 - 100.0 fL   MCH 31.0 26.0 - 34.0 pg   MCHC 31.7 30.0 - 36.0 g/dL   RDW 14.8 11.5 - 15.5 %   Platelets 284 150 - 400  K/uL   nRBC 0.0 0.0 - 0.2 %   Neutrophils Relative % 76 %   Neutro Abs 5.3 1.7 - 7.7 K/uL   Lymphocytes Relative 13 %   Lymphs Abs 0.9 0.7 - 4.0 K/uL   Monocytes Relative 8 %   Monocytes Absolute 0.6 0.1 - 1.0 K/uL   Eosinophils Relative 2 %   Eosinophils Absolute 0.2 0.0 - 0.5 K/uL   Basophils Relative 0 %   Basophils Absolute 0.0 0.0 - 0.1 K/uL   Immature Granulocytes 1 %   Abs Immature Granulocytes 0.04 0.00 - 0.07 K/uL  Comprehensive metabolic panel     Status: Abnormal   Collection Time: 09/22/18  6:39 PM  Result Value Ref Range   Sodium 136 135 - 145 mmol/L   Potassium 4.1 3.5 - 5.1 mmol/L   Chloride 96 (L) 98 - 111 mmol/L   CO2 26 22 - 32 mmol/L   Glucose, Bld 219 (H) 70 - 99 mg/dL   BUN 28 (H) 8 - 23 mg/dL   Creatinine, Ser 1.49 (H) 0.44 - 1.00 mg/dL   Calcium 9.1 8.9 - 10.3 mg/dL   Total Protein 7.4 6.5 - 8.1 g/dL   Albumin 3.7 3.5 - 5.0 g/dL   AST 16 15 - 41 U/L   ALT 19 0 - 44 U/L   Alkaline Phosphatase 78 38 - 126 U/L   Total Bilirubin  0.4 0.3 - 1.2 mg/dL   GFR calc non Af Amer 34 (L) >60 mL/min   GFR calc Af Amer 39 (L) >60 mL/min   Anion gap 14 5 - 15  Troponin I - ONCE - STAT     Status: None   Collection Time: 09/22/18  6:39 PM  Result Value Ref Range   Troponin I <0.03 <0.03 ng/mL  Brain natriuretic peptide     Status: None   Collection Time: 09/22/18  6:39 PM  Result Value Ref Range   B Natriuretic Peptide 29.0 0.0 - 100.0 pg/mL  Troponin I - Now Then Q3H     Status: None   Collection Time: 09/23/18  5:00 AM  Result Value Ref Range   Troponin I <0.03 <0.03 ng/mL  Glucose, capillary     Status: Abnormal   Collection Time: 09/23/18  7:52 AM  Result Value Ref Range   Glucose-Capillary 165 (H) 70 - 99 mg/dL   Dg Chest 1 View  Result Date: 09/23/2018 CLINICAL DATA:  Shortness of breath. Chest tightness. Progressive shortness of breath. EXAM: CHEST  1 VIEW COMPARISON:  Chest radiograph earlier this day. CT 06/17/2018 FINDINGS: Chronic hyperinflation  and emphysema. No focal airspace disease. Normal heart size and mediastinal contours. No pulmonary edema, large pleural effusion or pneumothorax. Surgical hardware in the lower cervical spine is partially included. IMPRESSION: Chronic hyperinflation and emphysema.  No acute findings. Electronically Signed   By: Keith Rake M.D.   On: 09/23/2018 00:09   Dg Chest 2 View  Result Date: 09/23/2018 CLINICAL DATA:  Cough and fever.  Nasal congestion.  Nausea. EXAM: CHEST - 2 VIEW COMPARISON:  Chest CT 06/17/2018 FINDINGS: The lungs are hyperinflated. Emphysema with central bronchial thickening. No focal airspace disease. No pulmonary edema, pleural effusion, or pneumothorax. No acute osseous abnormalities are seen. IMPRESSION: Emphysema with central bronchial thickening and hyperinflation, consistent with COPD. No focal airspace disease. Electronically Signed   By: Keith Rake M.D.   On: 09/23/2018 00:10     ASSESSMENT AND PLAN:  Chest pain: Self-limiting chest pain with negative troponin x3 and normal BNP. Recent echo shows minimal structural heart disease.  Continue aspirin, Plavix, isosorbide as well as beta-blocker therapy. May discharge today with outpatient follow up with Dr. Humphrey Rolls Monday 6/22 for further evaluation.   Echo 05/26/18:  Normal LVEF 60-65%. The cavity size is normal. There is no left ventricular wall thickness. Echo evidence of normal diastolic filling patterns.Unable to exclude region of hypokinesis of the inferior wall.  2. The right ventricle is in size. There is normal systolic function. Right ventricular systolic pressure is mildly elevated with an estimated pressure of 34 mmHg.  3. Select images suggesting small PFO/ASD by color doppler. Bubble study not performed.  Jake Bathe, NP-C Cell: [phone]

## 2018-09-24 DIAGNOSIS — E119 Type 2 diabetes mellitus without complications: Secondary | ICD-10-CM | POA: Diagnosis not present

## 2018-09-24 DIAGNOSIS — R079 Chest pain, unspecified: Secondary | ICD-10-CM | POA: Diagnosis not present

## 2018-09-24 DIAGNOSIS — J449 Chronic obstructive pulmonary disease, unspecified: Secondary | ICD-10-CM | POA: Diagnosis not present

## 2018-09-24 DIAGNOSIS — R0602 Shortness of breath: Secondary | ICD-10-CM | POA: Diagnosis not present

## 2018-09-24 DIAGNOSIS — I2510 Atherosclerotic heart disease of native coronary artery without angina pectoris: Secondary | ICD-10-CM | POA: Diagnosis not present

## 2018-09-24 DIAGNOSIS — R0600 Dyspnea, unspecified: Secondary | ICD-10-CM | POA: Diagnosis not present

## 2018-09-24 LAB — GLUCOSE, CAPILLARY
Glucose-Capillary: 146 mg/dL — ABNORMAL HIGH (ref 70–99)
Glucose-Capillary: 325 mg/dL — ABNORMAL HIGH (ref 70–99)
Glucose-Capillary: 132 mg/dL — ABNORMAL HIGH (ref 70–99)
Glucose-Capillary: 114 mg/dL — ABNORMAL HIGH (ref 70–99)

## 2018-09-24 MED ORDER — SODIUM CHLORIDE 0.9% FLUSH
3.0000 mL | Freq: Two times a day (BID) | INTRAVENOUS | Status: DC
  Administered 2018-09-24 (×2): 3 mL via INTRAVENOUS

## 2018-09-24 MED ORDER — SODIUM CHLORIDE 0.9 % IV BOLUS
500.0000 mL | Freq: Once | INTRAVENOUS | Status: AC
  Administered 2018-09-24: 500 mL via INTRAVENOUS

## 2018-09-24 NOTE — Progress Notes (Signed)
[facility] [facility] [place] NAME: [name] [name]    MR#:  [id number]  DATE OF BIRTH:  08-Jul-1940  SUBJECTIVE:    REVIEW OF SYSTEMS:   ROS Tolerating Diet: Tolerating PT:   DRUG ALLERGIES:   Allergies  Allergen Reactions  . Coconut Fatty Acids Swelling and Other (See Comments)    Throat swells    VITALS:  Blood pressure 93/69, pulse 89, temperature 98.5 F (36.9 C), temperature source Oral, resp. rate 18, height 5\' 4"  (1.626 m), weight 65.8 kg, SpO2 90 %.  PHYSICAL EXAMINATION:   Physical Exam  GENERAL:  78 y.o.-year-old patient lying in the bed with no acute distress.  EYES: Pupils equal, round, reactive to light and accommodation. No scleral icterus. Extraocular muscles intact.  HEENT: Head atraumatic, normocephalic. Oropharynx and nasopharynx clear.  NECK:  Supple, no jugular venous distention. No thyroid enlargement, no tenderness.  LUNGS: Normal breath sounds bilaterally, no wheezing, rales, rhonchi. No use of accessory muscles of respiration.  CARDIOVASCULAR: S1, S2 normal. No murmurs, rubs, or gallops.  ABDOMEN: Soft, nontender, nondistended. Bowel sounds present. No organomegaly or mass.  EXTREMITIES: No cyanosis, clubbing or edema b/l.    NEUROLOGIC: Cranial nerves II through XII are intact. No focal Motor or sensory deficits b/l.   PSYCHIATRIC:  patient is alert and oriented x 3.  SKIN: No obvious rash, lesion, or ulcer.   LABORATORY PANEL:  CBC Recent Labs  Lab 09/22/18 1839  WBC 7.0  HGB 10.4*  HCT 32.8*  PLT 284    Chemistries  Recent Labs  Lab 09/22/18 1839  NA 136  K 4.1  CL 96*  CO2 26  GLUCOSE 219*  BUN 28*  CREATININE 1.49*  CALCIUM 9.1  AST 16  ALT 19  ALKPHOS 78  BILITOT 0.4   Cardiac Enzymes Recent Labs  Lab 09/23/18 0926  TROPONINI <0.03   RADIOLOGY:  Dg Chest 1 View  Result Date: 09/23/2018 CLINICAL DATA:  Shortness of breath. Chest tightness. Progressive shortness of breath.  EXAM: CHEST  1 VIEW COMPARISON:  Chest radiograph earlier this day. CT 06/17/2018 FINDINGS: Chronic hyperinflation and emphysema. No focal airspace disease. Normal heart size and mediastinal contours. No pulmonary edema, large pleural effusion or pneumothorax. Surgical hardware in the lower cervical spine is partially included. IMPRESSION: Chronic hyperinflation and emphysema.  No acute findings. Electronically Signed   By: Keith Rake M.D.   On: 09/23/2018 00:09   Dg Chest 2 View  Result Date: 09/23/2018 CLINICAL DATA:  Cough and fever.  Nasal congestion.  Nausea. EXAM: CHEST - 2 VIEW COMPARISON:  Chest CT 06/17/2018 FINDINGS: The lungs are hyperinflated. Emphysema with central bronchial thickening. No focal airspace disease. No pulmonary edema, pleural effusion, or pneumothorax. No acute osseous abnormalities are seen. IMPRESSION: Emphysema with central bronchial thickening and hyperinflation, consistent with COPD. No focal airspace disease. Electronically Signed   By: Keith Rake M.D.   On: 09/23/2018 00:10   ASSESSMENT AND PLAN:   [name] [name]  is a 78 y.o. Caucasian female with a known history of multiple medical problems that will be mentioned below, who presented to the emergency room with the onset of midsternal chest pain graded 9/10 in severity and felt as pressure with associated diaphoresis as well as dyspnea without nausea or vomiting  1.Chest pain, ruled out acute coronary syndrome.  - Continue aspirin Plavix as well as nitroglycerin -Has been seen by cardiology  Dr Laurelyn Sickle PA here will see outpatient for  further evaluation on June 23 rd -no cp  2. Dyspnea with orthopnea due to COPD at baseline -Her BNP was within normal.   Patient had a echo in February which showed no significant wall motion abnormality. -Continue PRN nebs and inhalers  3. Coronary artery disease. Aspirin and Plavix and Imdur as well as beta-blocker .  4. Type 2 diabetes mellitus.  Continue  sliding scale insulin  5. Dyslipidemia.Statin therapy will be resumed.  6. DVT prophylaxis. Subcutaneous Lovenox  7. Dizziness with relative hypotension. Will give a bolus of normal saline. Hold blood pressure meds.  Physical therapy recommends home health PT. If blood pressure improves after bolus patient will be discharged to home. She is agreeable  CODE STATUS: DNR  DVT Prophylaxis: lovenox  TOTAL TIME TAKING CARE OF THIS PATIENT: *30* minutes.  >50% time spent on counselling and coordination of care  POSSIBLE D/C IN **1 DAYS, DEPENDING ON CLINICAL CONDITION.  Note: This dictation was prepared with Dragon dictation along with smaller phrase technology. Any transcriptional errors that result from this process are unintentional.  Fritzi Mandes M.D on 09/24/2018 at 1:37 PM  Between 7am to 6pm - Pager - [phone]  After 6pm go to [email or web address] - password [email or web address]  [facility]  Office  [phone]  CC: Primary care physician; Jerrol Banana., MDPatient ID: [name] [name], female   DOB: 1940/12/19, 78 y.o.   MRN: [id number]

## 2018-09-24 NOTE — TOC Transition Note (Signed)
Transition of Care [facility]) - CM/SW Discharge Note   Patient Details  Name: [name] [name] MRN: [id number] Date of Birth: 04/29/1940  [facility] CM/SW Contact:  Latanya Maudlin, RN Phone Number: 09/24/2018, 2:35 PM   Clinical Narrative: Patient to be discharged per MD order. Orders in place for home health services. Patient is active with [facility] and wishes to resume. Notified Helene Kelp of pending discharge. Per spouse they have all needed DME. Family to transport .      Final next level of care: [place] Barriers to Discharge: Continued Medical Work up   Patient Goals and CMS Choice Patient states their goals for this hospitalization and ongoing recovery are:: to get better CMS Medicare.gov Compare Post Acute Care list provided to:: Patient Choice offered to / list presented to : Patient, Spouse  Discharge Placement                       Discharge Plan and Services                          HH Arranged: RN, PT [facility] Agency: [facility] (formerly [employer]) Date [place]: 09/24/18 Time [place]: [place] Representative spoke with at [place]: [place] ([place]) Interventions     Readmission Risk Interventions No flowsheet data found.

## 2018-09-24 NOTE — Progress Notes (Signed)
SUBJECTIVE: Reports worsening shortness of breath, has chest wall tenderness along sternal border, worse with coughing and inspiration.   Vitals:   09/23/18 1938 09/23/18 2002 09/24/18 0520 09/24/18 0811  BP: (!) 120/48  (!) 130/48 (!) 134/55  Pulse: 80  73 73  Resp: 14  14 18   Temp: 98.4 F (36.9 C)  98.6 F (37 C) 98.5 F (36.9 C)  TempSrc: Oral  Oral Oral  SpO2: 91% (!) 87% 99% 100%  Weight:   65.8 kg   Height:        Intake/Output Summary (Last 24 hours) at 09/24/2018 0813 Last data filed at 09/23/2018 1700 Gross per 24 hour  Intake -  Output 300 ml  Net -300 ml    LABS: Basic Metabolic Panel: Recent Labs    09/22/18 1839  NA 136  K 4.1  CL 96*  CO2 26  GLUCOSE 219*  BUN 28*  CREATININE 1.49*  CALCIUM 9.1   Liver Function Tests: Recent Labs    09/22/18 1839  AST 16  ALT 19  ALKPHOS 78  BILITOT 0.4  PROT 7.4  ALBUMIN 3.7   No results for input(s): LIPASE, AMYLASE in the last 72 hours. CBC: Recent Labs    09/22/18 1839  WBC 7.0  NEUTROABS 5.3  HGB 10.4*  HCT 32.8*  MCV 97.6  PLT 284   Cardiac Enzymes: Recent Labs    09/22/18 1839 09/23/18 0500 09/23/18 0926  TROPONINI <0.03 <0.03 <0.03   BNP: Invalid input(s): POCBNP D-Dimer: No results for input(s): DDIMER in the last 72 hours. Hemoglobin A1C: No results for input(s): HGBA1C in the last 72 hours. Fasting Lipid Panel: No results for input(s): CHOL, HDL, LDLCALC, TRIG, CHOLHDL, LDLDIRECT in the last 72 hours. Thyroid Function Tests: No results for input(s): TSH, T4TOTAL, T3FREE, THYROIDAB in the last 72 hours.  Invalid input(s): FREET3 Anemia Panel: No results for input(s): VITAMINB12, FOLATE, FERRITIN, TIBC, IRON, RETICCTPCT in the last 72 hours.   PHYSICAL EXAM General: Resting comfortably, sl increase in work of breathing. HEENT:  Normocephalic and atramatic Neck:  No JVD.  Lungs: Increased work of breathing. Heart: HRRR . Normal S1 and S2 without gallops or murmurs.   Abdomen: Bowel sounds are positive, abdomen soft and non-tender  Msk:  Significant chest wall tenderness with light palpation of sternal border, also worse with inspiration. Extremities: No clubbing, cyanosis or edema.   Neuro: Alert and oriented X 3. Psych:  Good affect, responds appropriately  TELEMETRY: NSR 67bpm  ASSESSMENT AND PLAN:  Chest pain: Chest wall tenderness, likely costochondritis due to chronic cough. Negative troponin x3 and normal BNP. Recent echo shows minimal structural heart disease, see below.  Continue aspirin, Plavix, isosorbide as well as beta-blocker therapy.  Echo 05/26/18:  Normal LVEF 60-65%. The cavity size is normal. There is no left ventricular wall thickness. Echo evidence of normal diastolic filling patterns.Unable to exclude region of hypokinesis of the inferior wall.  2. The right ventricle is in size. There is normal systolic function. Right ventricular systolic pressure is mildly elevated with an estimated pressure of 34 mmHg.  3. Select images suggesting small PFO/ASD by color doppler. Bubble study not performed.  Dyspnea: Will defer to hospitalist for management of chronic oxygen-dependent COPD and reported shortness of breath.    May discharge from cardiac perspe with outpatient follow up with Dr. Humphrey Rolls Tuesday 6/22 for further evaluation.    Active Problems:   Chest pain    Jake Bathe, NP-C 09/24/2018 8:13 AM Cell:  [phone]     

## 2018-09-24 NOTE — Progress Notes (Signed)
Patient c/o being dizzy and lightheaded when she stood.  BP while sitting 115/71, HR 85.  BP standing 93/69, HR 89.  Dr. Posey Pronto notified.

## 2018-09-25 DIAGNOSIS — I2510 Atherosclerotic heart disease of native coronary artery without angina pectoris: Secondary | ICD-10-CM | POA: Diagnosis not present

## 2018-09-25 DIAGNOSIS — R079 Chest pain, unspecified: Secondary | ICD-10-CM | POA: Diagnosis not present

## 2018-09-25 DIAGNOSIS — E119 Type 2 diabetes mellitus without complications: Secondary | ICD-10-CM | POA: Diagnosis not present

## 2018-09-25 DIAGNOSIS — R0600 Dyspnea, unspecified: Secondary | ICD-10-CM | POA: Diagnosis not present

## 2018-09-25 LAB — GLUCOSE, CAPILLARY: Glucose-Capillary: 165 mg/dL — ABNORMAL HIGH (ref 70–99)

## 2018-09-25 MED ORDER — BUDESONIDE-FORMOTEROL FUMARATE 80-4.5 MCG/ACT IN AERO: 2.0000 | INHALATION_SPRAY | Freq: Two times a day (BID) | RESPIRATORY_TRACT | 2 refills | Status: DC

## 2018-09-25 NOTE — Progress Notes (Signed)
SUBJECTIVE: Continues to have chest wall tenderness along sternal border, worse with coughing and inspiration. Mild dyspnea.   Vitals:   09/24/18 1706 09/24/18 1928 09/25/18 0436 09/25/18 0800  BP: (!) 124/52 (!) 117/47 (!) 125/55 (!) 130/52  Pulse: 68 68 69   Resp:   18 16  Temp:  97.6 F (36.4 C) 98 F (36.7 C) 98.8 F (37.1 C)  TempSrc:   Oral Oral  SpO2:  98% 96% 92%  Weight:      Height:        Intake/Output Summary (Last 24 hours) at 09/25/2018 0815 Last data filed at 09/25/2018 0300 Gross per 24 hour  Intake 1600.85 ml  Output 300 ml  Net 1300.85 ml    LABS: Basic Metabolic Panel: Recent Labs    09/22/18 1839  NA 136  K 4.1  CL 96*  CO2 26  GLUCOSE 219*  BUN 28*  CREATININE 1.49*  CALCIUM 9.1   Liver Function Tests: Recent Labs    09/22/18 1839  AST 16  ALT 19  ALKPHOS 78  BILITOT 0.4  PROT 7.4  ALBUMIN 3.7   No results for input(s): LIPASE, AMYLASE in the last 72 hours. CBC: Recent Labs    09/22/18 1839  WBC 7.0  NEUTROABS 5.3  HGB 10.4*  HCT 32.8*  MCV 97.6  PLT 284   Cardiac Enzymes: Recent Labs    09/22/18 1839 09/23/18 0500 09/23/18 0926  TROPONINI <0.03 <0.03 <0.03   BNP: Invalid input(s): POCBNP D-Dimer: No results for input(s): DDIMER in the last 72 hours. Hemoglobin A1C: No results for input(s): HGBA1C in the last 72 hours. Fasting Lipid Panel: No results for input(s): CHOL, HDL, LDLCALC, TRIG, CHOLHDL, LDLDIRECT in the last 72 hours. Thyroid Function Tests: No results for input(s): TSH, T4TOTAL, T3FREE, THYROIDAB in the last 72 hours.  Invalid input(s): FREET3 Anemia Panel: No results for input(s): VITAMINB12, FOLATE, FERRITIN, TIBC, IRON, RETICCTPCT in the last 72 hours.   PHYSICAL EXAM General: Resting comfortably, sl increase in work of breathing. HEENT:  Normocephalic and atramatic Neck:  No JVD.  Lungs: Increased work of breathing. Heart: HRRR . Normal S1 and S2 without gallops or murmurs.  Abdomen: Bowel  sounds are positive, abdomen soft and non-tender  Msk:  Significant chest wall tenderness with light palpation of sternal border, also worse with inspiration. Extremities: No clubbing, cyanosis or edema.   Neuro: Alert and oriented X 3. Psych:  Good affect, responds appropriately  TELEMETRY: NSR 67bpm  ASSESSMENT AND PLAN:  Chest pain: Chest wall tenderness, likely costochondritis due to chronic cough. Negative troponin x3 and normal BNP. Recent echo shows minimal structural heart disease, see below.  Continue aspirin, Plavix, isosorbide as well as beta-blocker therapy. Advise outpatient workup with Dr. Humphrey Rolls, follow up Tuesday 6/23 10am at [place].   Echo 05/26/18:  Normal LVEF 60-65%. The cavity size is normal. There is no left ventricular wall thickness. Echo evidence of normal diastolic filling patterns.  3. Select images suggesting small PFO/ASD by color doppler. Bubble study not performed.  Dyspnea: Will defer to hospitalist for management of chronic oxygen-dependent COPD and reported shortness of breath.      Active Problems:   Chest pain    Jake Bathe, NP-C 09/25/2018 8:15 AM Cell: [phone]

## 2018-09-25 NOTE — Progress Notes (Signed)
Pt to be discharged today. Iv and tele removed. disch instructions and prescrip given. Pt disch on 02. Family to bring portable unit in car.

## 2018-09-25 NOTE — Discharge Instructions (Signed)
Use your oxygen and inhalers as before

## 2018-09-25 NOTE — Discharge Summary (Signed)
[facility] [place] NAME: [name] [name]    MR#:  [id number]  DATE OF BIRTH:  01-05-1941  DATE OF ADMISSION:  09/22/2018 ADMITTING PHYSICIAN: Christel Mormon, MD  DATE OF DISCHARGE: 09/25/2018  PRIMARY CARE PHYSICIAN: Jerrol Banana., MD    ADMISSION DIAGNOSIS:  SOB (shortness of breath) [R06.02] Chest pain, unspecified type [R07.9]  DISCHARGE DIAGNOSIS:  Chest pain ruled out MI--f/u Dr Humphrey Rolls as out pt Dizziness due to hypotension--improved  SECONDARY DIAGNOSIS:   Past Medical History:  Diagnosis Date  . Abnormal CT lung screening 10/17/2015  . COPD (chronic obstructive pulmonary disease) ([place])   . Coronary artery disease, non-occlusive    a. cath 2006: min nonobs CAD; b. cath 12/2010: cath LAD 50%, RCA 60%; c. 08/2013: Minimal luminal irregs, right dominant system with no significant CAD, diffuse luminal irregs noted. Normal EF 55%, no AS or MS.   . Diabetes mellitus   . Hyperlipemia    Followed by Dr. Rosanna Randy  . Hypertension   . Lung cancer ([place])   . Macular degeneration    rt  . Personal history of tobacco use, presenting hazards to health 10/15/2015  . Pneumonia    hx  . Shortness of breath   . Stroke [facility])     HOSPITAL COURSE:   [name] a78 y.o.Caucasian femalewith a known history of multiple medical problems that will be mentioned below, who presented to the emergency room with the onset of midsternal chest pain graded 9/10 in severity and felt as pressure with associated diaphoresis as well as dyspnea without nausea or vomiting  1.Chest pain, ruled out acute coronary syndrome.  -Continue aspirin Plavix as well as nitroglycerin -Has been seen by cardiology  Dr Laurelyn Sickle PA here will see outpatient for further evaluation on June 23 rd -no cp -cardiac markers neg  2. Dyspnea with orthopnea due to COPD at baseline -Her BNP was within normal.  -Patient had a echo in February which showed no  significant wall motion abnormality. -Continue PRN nebs and inhalers  3. Coronary artery disease. Aspirin and Plavix and Imdur as well as beta-blocker .  4. Type 2 diabetes mellitus.Continue sliding scale insulin  5. Dyslipidemia.Statin therapy will be resumed.  6. DVT prophylaxis. Subcutaneous Lovenox  7. Dizziness with relative hypotension. recieved bolus of normal saline.  -BP much improved Pt 's amlodipine has been held at discharge -she is advised to monitor her blood pressure at home and hold blood pressure meds the day it is less than 381 systolic   Physical therapy recommends home health PT-resume home health services   D/c home today. Pt agreeable CONSULTS OBTAINED:  Treatment Team:  Dionisio David, MD  DRUG ALLERGIES:   Allergies  Allergen Reactions  . Coconut Fatty Acids Swelling and Other (See Comments)    Throat swells    DISCHARGE MEDICATIONS:   Allergies as of 09/25/2018      Reactions   Coconut Fatty Acids Swelling, Other (See Comments)   Throat swells      Medication List    STOP taking these medications   amLODipine 5 MG tablet Commonly known as: NORVASC   budesonide-formoterol 160-4.5 MCG/ACT inhaler Commonly known as: SYMBICORT Replaced by: budesonide-formoterol 80-4.5 MCG/ACT inhaler   ONE TOUCH ULTRA 2 w/Device Kit   ONE TOUCH ULTRA TEST test strip Generic drug: glucose blood     TAKE these medications   aspirin 81 MG EC tablet Take 1 tablet (81 mg total)  by mouth daily.   atorvastatin 40 MG tablet Commonly known as: LIPITOR Take 1 tablet (40 mg total) by mouth daily at 6 PM.   budesonide-formoterol 80-4.5 MCG/ACT inhaler Commonly known as: Symbicort Inhale 2 puffs into the lungs 2 (two) times daily. Replaces: budesonide-formoterol 160-4.5 MCG/ACT inhaler   clopidogrel 75 MG tablet Commonly known as: PLAVIX TAKE 1 TABLET BY MOUTH EVERY DAY   fluticasone 50 MCG/ACT nasal spray Commonly known as:  FLONASE Place 2 sprays into both nostrils daily.   gabapentin 600 MG tablet Commonly known as: NEURONTIN Take 0.5 tablets (300 mg total) by mouth 2 (two) times daily.   glimepiride 2 MG tablet Commonly known as: AMARYL Take 1 tablet (2 mg total) by mouth 2 (two) times daily.   ICaps Caps Take 1 capsule by mouth 2 (two) times daily.   isosorbide mononitrate 30 MG 24 hr tablet Commonly known as: IMDUR TAKE 1 TABLET BY MOUTH  DAILY   LORazepam 1 MG tablet Commonly known as: ATIVAN Take 1 tablet (1 mg total) by mouth 2 (two) times daily as needed for anxiety.   magnesium oxide 400 (241.3 Mg) MG tablet Commonly known as: MAG-OX Take 400 mg by mouth 2 (two) times daily.   metFORMIN 500 MG tablet Commonly known as: GLUCOPHAGE Take 1 tablet (500 mg total) by mouth 2 (two) times daily with a meal.   metoprolol tartrate 25 MG tablet Commonly known as: LOPRESSOR Take 1 tablet (25 mg total) by mouth 2 (two) times daily.   mometasone 0.1 % lotion Commonly known as: ELOCON PLACE 4 DROPS INTO EACH EAR CANAL AT BEDTIME AS NEEDED FOR DRY SKIN/ITCHING/DISCOMFORT.   nitroGLYCERIN 0.4 MG SL tablet Commonly known as: NITROSTAT Place 1 tablet (0.4 mg total) under the tongue every 5 (five) minutes as needed for chest pain. What changed: Another medication with the same name was removed. Continue taking this medication, and follow the directions you see here.   pantoprazole 40 MG tablet Commonly known as: PROTONIX Take 1 tablet (40 mg total) by mouth daily.       If you experience worsening of your admission symptoms, develop shortness of breath, life threatening emergency, suicidal or homicidal thoughts you must seek medical attention immediately by calling 911 or calling your MD immediately  if symptoms less severe.  You Must read complete instructions/literature along with all the possible adverse reactions/side effects for all the Medicines you take and that have been prescribed to  you. Take any new Medicines after you have completely understood and accept all the possible adverse reactions/side effects.   Please note  You were cared for by a hospitalist during your hospital stay. If you have any questions about your discharge medications or the care you received while you were in the hospital after you are discharged, you can call the unit and asked to speak with the hospitalist on call if the hospitalist that took care of you is not available. Once you are discharged, your primary care physician will handle any further medical issues. Please note that NO REFILLS for any discharge medications will be authorized once you are discharged, as it is imperative that you return to your primary care physician (or establish a relationship with a primary care physician if you do not have one) for your aftercare needs so that they can reassess your need for medications and monitor your lab values. Today   SUBJECTIVE   Overall better Back pain  VITAL SIGNS:  Blood pressure (!) 125/55, pulse  69, temperature 98 F (36.7 C), temperature source Oral, resp. rate 18, height '5\' 4"'  (1.626 m), weight 65.8 kg, SpO2 96 %.  I/O:    Intake/Output Summary (Last 24 hours) at 09/25/2018 0716 Last data filed at 09/25/2018 0300 Gross per 24 hour  Intake 1600.85 ml  Output 300 ml  Net 1300.85 ml    PHYSICAL EXAMINATION:  GENERAL:  78 y.o.-year-old patient lying in the bed with no acute distress.  EYES: Pupils equal, round, reactive to light and accommodation. No scleral icterus. Extraocular muscles intact.  HEENT: Head atraumatic, normocephalic. Oropharynx and nasopharynx clear.  NECK:  Supple, no jugular venous distention. No thyroid enlargement, no tenderness.  LUNGS: Normal breath sounds bilaterally, no wheezing, rales,rhonchi or crepitation. No use of accessory muscles of respiration.  CARDIOVASCULAR: S1, S2 normal. No murmurs, rubs, or gallops.  ABDOMEN: Soft, non-tender, non-distended.  Bowel sounds present. No organomegaly or mass.  EXTREMITIES: No pedal edema, cyanosis, or clubbing.  NEUROLOGIC: Cranial nerves II through XII are intact. Muscle strength 5/5 in all extremities. Sensation intact. Gait not checked.  PSYCHIATRIC: The patient is alert and oriented x 3.  SKIN: No obvious rash, lesion, or ulcer.   DATA REVIEW:   CBC  Recent Labs  Lab 09/22/18 1839  WBC 7.0  HGB 10.4*  HCT 32.8*  PLT 284    Chemistries  Recent Labs  Lab 09/22/18 1839  NA 136  K 4.1  CL 96*  CO2 26  GLUCOSE 219*  BUN 28*  CREATININE 1.49*  CALCIUM 9.1  AST 16  ALT 19  ALKPHOS 78  BILITOT 0.4    Microbiology Results   Recent Results (from the past 240 hour(s))  SARS Coronavirus 2 (CEPHEID- Performed in [place] [facility] hospital lab), Hosp Order     Status: None   Collection Time: 09/23/18  9:06 AM   Specimen: Nasopharyngeal Swab  Result Value Ref Range Status   SARS Coronavirus 2 NEGATIVE NEGATIVE Final    Comment: (NOTE) If result is NEGATIVE SARS-CoV-2 target nucleic acids are NOT DETECTED. The SARS-CoV-2 RNA is generally detectable in upper and lower  respiratory specimens during the acute phase of infection. The lowest  concentration of SARS-CoV-2 viral copies this assay can detect is 250  copies / mL. A negative result does not preclude SARS-CoV-2 infection  and should not be used as the sole basis for treatment or other  patient management decisions.  A negative result may occur with  improper specimen collection / handling, submission of specimen other  than nasopharyngeal swab, presence of viral mutation(s) within the  areas targeted by this assay, and inadequate number of viral copies  (<250 copies / mL). A negative result must be combined with clinical  observations, patient history, and epidemiological information. If result is POSITIVE SARS-CoV-2 target nucleic acids are DETECTED. The SARS-CoV-2 RNA is generally detectable in upper and lower  respiratory  specimens dur ing the acute phase of infection.  Positive  results are indicative of active infection with SARS-CoV-2.  Clinical  correlation with patient history and other diagnostic information is  necessary to determine patient infection status.  Positive results do  not rule out bacterial infection or co-infection with other viruses. If result is PRESUMPTIVE POSTIVE SARS-CoV-2 nucleic acids MAY BE PRESENT.   A presumptive positive result was obtained on the submitted specimen  and confirmed on repeat testing.  While 2019 novel coronavirus  (SARS-CoV-2) nucleic acids may be present in the submitted sample  additional confirmatory testing may be  necessary for epidemiological  and / or clinical management purposes  to differentiate between  SARS-CoV-2 and other Sarbecovirus currently known to infect humans.  If clinically indicated additional testing with an alternate test  methodology [phone]) is advised. The SARS-CoV-2 RNA is generally  detectable in upper and lower respiratory sp ecimens during the acute  phase of infection. The expected result is Negative. Fact Sheet for Patients:  StrictlyIdeas.no Fact Sheet for Healthcare Providers: BankingDealers.co.za This test is not yet approved or cleared by the [place] FDA and has been authorized for detection and/or diagnosis of SARS-CoV-2 by FDA under an Emergency Use Authorization (EUA).  This EUA will remain in effect (meaning this test can be used) for the duration of the COVID-19 declaration under Section 564(b)(1) of the Act, 21 U.S.C. section 360bbb-3(b)(1), unless the authorization is terminated or revoked sooner. Performed at [facility], [address], [place], [place] [address]     RADIOLOGY:  No results found.   CODE STATUS:     Code Status Orders  (From admission, onward)         Start     Ordered   09/23/18 1035  Do not attempt resuscitation  (DNR)  Continuous    Question Answer Comment  In the event of cardiac or respiratory ARREST Do not call a "code blue"   In the event of cardiac or respiratory ARREST Do not perform Intubation, CPR, defibrillation or ACLS   In the event of cardiac or respiratory ARREST Use medication by any route, position, wound care, and other measures to relive pain and suffering. May use oxygen, suction and manual treatment of airway obstruction as needed for comfort.      09/23/18 1034        Code Status History    Date Active Date Inactive Code Status Order ID Comments User Context   09/23/2018 0327 09/23/2018 1034 Full Code [id number]  Mansy, Arvella Merles, MD ED   05/06/2018 1439 05/07/2018 1739 Full Code [id number]  Gladstone Lighter, MD ED   03/30/2017 1959 04/03/2017 1402 Full Code [id number]  Demetrios Loll, MD Inpatient   07/15/2016 0403 07/16/2016 1858 Full Code [id number]  Harrie Foreman, MD Inpatient   02/05/2016 1510 02/13/2016 1811 Full Code [id number]  Henreitta Leber, MD Inpatient   01/27/2015 2148 01/29/2015 1759 Full Code [id number]  Hower, Aaron Mose, MD ED   10/24/2014 1203 10/25/2014 1626 Full Code [id number]  Algernon Huxley, MD Inpatient   08/30/2014 0105 08/30/2014 1948 Full Code [id number]  Lance Coon, MD Inpatient   10/19/2013 1619 10/20/2013 1604 Full Code [id number]  Newman Pies, MD Inpatient   Advance Care Planning Activity      TOTAL TIME TAKING CARE OF THIS PATIENT: *40* minutes.    Fritzi Mandes M.D on 09/25/2018 at 7:16 AM  Between 7am to 6pm - Pager - [phone] After 6pm go to [email or web address] - password [email or web address] [facility]  Office  [phone]  CC: Primary care physician; Jerrol Banana., MD

## 2018-09-26 ENCOUNTER — Telehealth: Payer: Self-pay | Admitting: Family Medicine

## 2018-09-26 DIAGNOSIS — J449 Chronic obstructive pulmonary disease, unspecified: Secondary | ICD-10-CM | POA: Diagnosis not present

## 2018-09-26 MED ORDER — ALBUTEROL SULFATE HFA 108 (90 BASE) MCG/ACT IN AERS: 2.0000 | INHALATION_SPRAY | RESPIRATORY_TRACT | 11 refills | Status: DC | PRN

## 2018-09-26 NOTE — Telephone Encounter (Signed)
Inhaler was sent into the pharmacy.

## 2018-09-26 NOTE — Telephone Encounter (Signed)
Please review. Thanks!  

## 2018-09-26 NOTE — Telephone Encounter (Signed)
Albuterol MDI q 4 hrs prn,11rf

## 2018-09-26 NOTE — Telephone Encounter (Signed)
Levada Dy the Nurse case mgr. With [facility] called with update on [name] [name].  Her fasting blood sugar is 334 this morning with sweats.   She came home from [place] on 09/25/18, she is corona negative, no fever, increased SOB but with continued oxygen.     Nurse wants to know if she can get a rescue inhaler??

## 2018-09-27 DIAGNOSIS — I10 Essential (primary) hypertension: Secondary | ICD-10-CM | POA: Diagnosis not present

## 2018-09-27 DIAGNOSIS — I2510 Atherosclerotic heart disease of native coronary artery without angina pectoris: Secondary | ICD-10-CM | POA: Diagnosis not present

## 2018-09-27 DIAGNOSIS — R0602 Shortness of breath: Secondary | ICD-10-CM | POA: Diagnosis not present

## 2018-09-27 DIAGNOSIS — R079 Chest pain, unspecified: Secondary | ICD-10-CM | POA: Diagnosis not present

## 2018-09-30 ENCOUNTER — Telehealth: Payer: Self-pay | Admitting: Family Medicine

## 2018-09-30 NOTE — Telephone Encounter (Signed)
FYI. Thanks.

## 2018-09-30 NOTE — Telephone Encounter (Signed)
Kenney Houseman, RN w/ [facility] [phone]  Update on pt: Pt has been discharged from hospital. Will resume care on Sun., 10-02-18.  Thanks, [employer]

## 2018-10-02 DIAGNOSIS — E039 Hypothyroidism, unspecified: Secondary | ICD-10-CM | POA: Diagnosis not present

## 2018-10-02 DIAGNOSIS — Z87891 Personal history of nicotine dependence: Secondary | ICD-10-CM | POA: Diagnosis not present

## 2018-10-02 DIAGNOSIS — Z7984 Long term (current) use of oral hypoglycemic drugs: Secondary | ICD-10-CM | POA: Diagnosis not present

## 2018-10-02 DIAGNOSIS — M4722 Other spondylosis with radiculopathy, cervical region: Secondary | ICD-10-CM | POA: Diagnosis not present

## 2018-10-02 DIAGNOSIS — J441 Chronic obstructive pulmonary disease with (acute) exacerbation: Secondary | ICD-10-CM | POA: Diagnosis not present

## 2018-10-02 DIAGNOSIS — K219 Gastro-esophageal reflux disease without esophagitis: Secondary | ICD-10-CM | POA: Diagnosis not present

## 2018-10-02 DIAGNOSIS — E43 Unspecified severe protein-calorie malnutrition: Secondary | ICD-10-CM | POA: Diagnosis not present

## 2018-10-02 DIAGNOSIS — I10 Essential (primary) hypertension: Secondary | ICD-10-CM | POA: Diagnosis not present

## 2018-10-02 DIAGNOSIS — Z85118 Personal history of other malignant neoplasm of bronchus and lung: Secondary | ICD-10-CM | POA: Diagnosis not present

## 2018-10-02 DIAGNOSIS — I2510 Atherosclerotic heart disease of native coronary artery without angina pectoris: Secondary | ICD-10-CM | POA: Diagnosis not present

## 2018-10-02 DIAGNOSIS — M069 Rheumatoid arthritis, unspecified: Secondary | ICD-10-CM | POA: Diagnosis not present

## 2018-10-02 DIAGNOSIS — E1142 Type 2 diabetes mellitus with diabetic polyneuropathy: Secondary | ICD-10-CM | POA: Diagnosis not present

## 2018-10-02 DIAGNOSIS — H3530 Unspecified macular degeneration: Secondary | ICD-10-CM | POA: Diagnosis not present

## 2018-10-02 DIAGNOSIS — E785 Hyperlipidemia, unspecified: Secondary | ICD-10-CM | POA: Diagnosis not present

## 2018-10-02 DIAGNOSIS — Z9981 Dependence on supplemental oxygen: Secondary | ICD-10-CM | POA: Diagnosis not present

## 2018-10-02 DIAGNOSIS — I69354 Hemiplegia and hemiparesis following cerebral infarction affecting left non-dominant side: Secondary | ICD-10-CM | POA: Diagnosis not present

## 2018-10-02 DIAGNOSIS — I6529 Occlusion and stenosis of unspecified carotid artery: Secondary | ICD-10-CM | POA: Diagnosis not present

## 2018-10-02 DIAGNOSIS — Z7951 Long term (current) use of inhaled steroids: Secondary | ICD-10-CM | POA: Diagnosis not present

## 2018-10-03 ENCOUNTER — Encounter: Payer: Self-pay | Admitting: Family Medicine

## 2018-10-03 ENCOUNTER — Ambulatory Visit (INDEPENDENT_AMBULATORY_CARE_PROVIDER_SITE_OTHER): Payer: Medicare Other | Admitting: Family Medicine

## 2018-10-03 ENCOUNTER — Other Ambulatory Visit: Payer: Self-pay

## 2018-10-03 VITALS — BP 124/80 | HR 63 | Temp 98.1°F | Resp 17 | Wt 151.0 lb

## 2018-10-03 DIAGNOSIS — I10 Essential (primary) hypertension: Secondary | ICD-10-CM

## 2018-10-03 DIAGNOSIS — C3411 Malignant neoplasm of upper lobe, right bronchus or lung: Secondary | ICD-10-CM | POA: Diagnosis not present

## 2018-10-03 DIAGNOSIS — E1142 Type 2 diabetes mellitus with diabetic polyneuropathy: Secondary | ICD-10-CM | POA: Diagnosis not present

## 2018-10-03 DIAGNOSIS — R079 Chest pain, unspecified: Secondary | ICD-10-CM | POA: Diagnosis not present

## 2018-10-03 DIAGNOSIS — E782 Mixed hyperlipidemia: Secondary | ICD-10-CM

## 2018-10-03 DIAGNOSIS — J449 Chronic obstructive pulmonary disease, unspecified: Secondary | ICD-10-CM

## 2018-10-03 DIAGNOSIS — I6523 Occlusion and stenosis of bilateral carotid arteries: Secondary | ICD-10-CM | POA: Diagnosis not present

## 2018-10-03 NOTE — Progress Notes (Signed)
Patient: [name] [name] Female    DOB: 10-Mar-1941   78 y.o.   MRN: [id number] Visit Date: 10/03/2018  Today's Provider: [name], MD   Chief Complaint  Patient presents with  . Hospitalization Follow-up   Subjective:     HPI   Hospital Follow Up. Chest pain and COPD exacerbation.Hypotension resolved. She c/o chronic breathing difficulties. Allergies  Allergen Reactions  . Coconut Fatty Acids Swelling and Other (See Comments)    Throat swells     Current Outpatient Medications:  .  albuterol (VENTOLIN HFA) 108 (90 Base) MCG/ACT inhaler, Inhale 2 puffs into the lungs every 4 (four) hours as needed for wheezing or shortness of breath., Disp: 8 g, Rfl: 11 .  aspirin EC 81 MG EC tablet, Take 1 tablet (81 mg total) by mouth daily., Disp: 30 tablet, Rfl: 0 .  atorvastatin (LIPITOR) 40 MG tablet, Take 1 tablet (40 mg total) by mouth daily at 6 PM., Disp: 90 tablet, Rfl: 3 .  budesonide-formoterol (SYMBICORT) 80-4.5 MCG/ACT inhaler, Inhale 2 puffs into the lungs 2 (two) times daily., Disp: 1 Inhaler, Rfl: 2 .  clopidogrel (PLAVIX) 75 MG tablet, TAKE 1 TABLET BY MOUTH EVERY DAY, Disp: 90 tablet, Rfl: 1 .  fluticasone (FLONASE) 50 MCG/ACT nasal spray, Place 2 sprays into both nostrils daily., Disp: 16 g, Rfl: 6 .  gabapentin (NEURONTIN) 600 MG tablet, Take 0.5 tablets (300 mg total) by mouth 2 (two) times daily., Disp: 180 tablet, Rfl: 3 .  glimepiride (AMARYL) 2 MG tablet, Take 1 tablet (2 mg total) by mouth 2 (two) times daily., Disp: 180 tablet, Rfl: 3 .  isosorbide mononitrate (IMDUR) 30 MG 24 hr tablet, TAKE 1 TABLET BY MOUTH  DAILY, Disp: 90 tablet, Rfl: 3 .  LORazepam (ATIVAN) 1 MG tablet, Take 1 tablet (1 mg total) by mouth 2 (two) times daily as needed for anxiety., Disp: 30 tablet, Rfl: 5 .  magnesium oxide (MAG-OX) 400 (241.3 MG) MG tablet, Take 400 mg by mouth 2 (two) times daily. , Disp: , Rfl:  .  metFORMIN (GLUCOPHAGE) 500 MG tablet, Take 1 tablet (500 mg  total) by mouth 2 (two) times daily with a meal., Disp: 180 tablet, Rfl: 3 .  metoprolol tartrate (LOPRESSOR) 25 MG tablet, Take 1 tablet (25 mg total) by mouth 2 (two) times daily., Disp: 180 tablet, Rfl: 3 .  mometasone (ELOCON) 0.1 % lotion, PLACE 4 DROPS INTO EACH EAR CANAL AT BEDTIME AS NEEDED FOR DRY SKIN/ITCHING/DISCOMFORT., Disp: , Rfl:  .  Multiple Vitamins-Minerals (ICAPS) CAPS, Take 1 capsule by mouth 2 (two) times daily. , Disp: , Rfl:  .  nitroGLYCERIN (NITROSTAT) 0.4 MG SL tablet, Place 1 tablet (0.4 mg total) under the tongue every 5 (five) minutes as needed for chest pain., Disp: 25 tablet, Rfl: 3 .  pantoprazole (PROTONIX) 40 MG tablet, Take 1 tablet (40 mg total) by mouth daily., Disp: 90 tablet, Rfl: 3 No current facility-administered medications for this visit.   Facility-Administered Medications Ordered in Other Visits:  .  ondansetron (ZOFRAN) 8 mg, dexamethasone (DECADRON) 10 mg in sodium chloride 0.9 % 50 mL IVPB, , Intravenous, Once, Finnegan, Kathlene November, MD  Review of Systems  Constitutional: Positive for fatigue.  Respiratory: Positive for cough and shortness of breath.   Cardiovascular: Negative.   Gastrointestinal: Negative.   Endocrine: Negative.   Musculoskeletal: Negative.   Allergic/Immunologic: Negative.   Hematological: Negative.   Psychiatric/Behavioral: Negative.   All other systems  reviewed and are negative.   Social History   Tobacco Use  . Smoking status: Former Smoker    Packs/day: 1.00    Years: 50.00    Pack years: 50.00    Types: Cigarettes    Quit date: 10/03/2015    Years since quitting: 3.0  . Smokeless tobacco: Never Used  . Tobacco comment: smokes 3 cigs daily 05/06/15. Pt instructed to quit.  Substance Use Topics  . Alcohol use: No    Alcohol/week: 0.0 standard drinks      Objective:   BP 124/80 (BP Location: Right Arm, Patient Position: Sitting, Cuff Size: Normal)   Pulse 63   Temp 98.1 F (36.7 C) (Oral)   Resp 17   Wt  151 lb (68.5 kg)   SpO2 98%   BMI 25.92 kg/m  Vitals:   10/03/18 1537  BP: 124/80  Pulse: 63  Resp: 17  Temp: 98.1 F (36.7 C)  TempSrc: Oral  SpO2: 98%  Weight: 151 lb (68.5 kg)     Physical Exam Vitals signs reviewed.  Constitutional:      Appearance: She is well-developed.     Comments: Patient sitting in wheelchair due to chronic weakness.  HENT:     Head: Normocephalic and atraumatic.     Right Ear: External ear normal.     Left Ear: External ear normal.     Nose: Nose normal.  Eyes:     General: No scleral icterus.    Conjunctiva/sclera: Conjunctivae normal.  Neck:     Thyroid: No thyromegaly.  Cardiovascular:     Rate and Rhythm: Normal rate and regular rhythm.     Heart sounds: Normal heart sounds.  Pulmonary:     Effort: Pulmonary effort is normal.     Breath sounds: Normal breath sounds.  Abdominal:     Palpations: Abdomen is soft.  Musculoskeletal:     Right lower leg: No edema.     Left lower leg: No edema.  Skin:    General: Skin is warm and dry.  Neurological:     Mental Status: She is alert and oriented to person, place, and time. Mental status is at baseline.  Psychiatric:        Mood and Affect: Mood normal.        Behavior: Behavior normal.        Thought Content: Thought content normal.        Judgment: Judgment normal.      No results found for any visits on 10/03/18.     Assessment & Plan    1. Chronic obstructive pulmonary disease, unspecified COPD type (HCC) Chronic SOB . O2 sat 98% on 1.5 liters O2. - Ambulatory referral to Pulmonology More than 50% of 25 minute visit spent in counseling or coordination of care  2. HYPERTENSION, BENIGN   3. Bilateral carotid artery stenosis Pt has quit smoking.  4. Primary cancer of right upper lobe of lung [facility]) Per oncology  5. Diabetic polyneuropathy associated with type 2 diabetes mellitus ([place])   6. Mixed hyperlipidemia  7.Chest Pain Saw cardiologist earlier.     Richard  Cranford Mon, MD  [facility]

## 2018-10-06 DIAGNOSIS — R079 Chest pain, unspecified: Secondary | ICD-10-CM | POA: Diagnosis not present

## 2018-10-06 DIAGNOSIS — I10 Essential (primary) hypertension: Secondary | ICD-10-CM | POA: Diagnosis not present

## 2018-10-06 DIAGNOSIS — I2510 Atherosclerotic heart disease of native coronary artery without angina pectoris: Secondary | ICD-10-CM | POA: Diagnosis not present

## 2018-10-06 DIAGNOSIS — R0602 Shortness of breath: Secondary | ICD-10-CM | POA: Diagnosis not present

## 2018-10-07 DIAGNOSIS — E039 Hypothyroidism, unspecified: Secondary | ICD-10-CM | POA: Diagnosis not present

## 2018-10-07 DIAGNOSIS — I10 Essential (primary) hypertension: Secondary | ICD-10-CM | POA: Diagnosis not present

## 2018-10-07 DIAGNOSIS — E43 Unspecified severe protein-calorie malnutrition: Secondary | ICD-10-CM | POA: Diagnosis not present

## 2018-10-07 DIAGNOSIS — Z9981 Dependence on supplemental oxygen: Secondary | ICD-10-CM | POA: Diagnosis not present

## 2018-10-07 DIAGNOSIS — Z7984 Long term (current) use of oral hypoglycemic drugs: Secondary | ICD-10-CM | POA: Diagnosis not present

## 2018-10-07 DIAGNOSIS — E785 Hyperlipidemia, unspecified: Secondary | ICD-10-CM | POA: Diagnosis not present

## 2018-10-07 DIAGNOSIS — Z7951 Long term (current) use of inhaled steroids: Secondary | ICD-10-CM | POA: Diagnosis not present

## 2018-10-07 DIAGNOSIS — I6529 Occlusion and stenosis of unspecified carotid artery: Secondary | ICD-10-CM | POA: Diagnosis not present

## 2018-10-07 DIAGNOSIS — K219 Gastro-esophageal reflux disease without esophagitis: Secondary | ICD-10-CM | POA: Diagnosis not present

## 2018-10-07 DIAGNOSIS — I2510 Atherosclerotic heart disease of native coronary artery without angina pectoris: Secondary | ICD-10-CM | POA: Diagnosis not present

## 2018-10-07 DIAGNOSIS — J441 Chronic obstructive pulmonary disease with (acute) exacerbation: Secondary | ICD-10-CM | POA: Diagnosis not present

## 2018-10-07 DIAGNOSIS — E1142 Type 2 diabetes mellitus with diabetic polyneuropathy: Secondary | ICD-10-CM | POA: Diagnosis not present

## 2018-10-07 DIAGNOSIS — Z85118 Personal history of other malignant neoplasm of bronchus and lung: Secondary | ICD-10-CM | POA: Diagnosis not present

## 2018-10-07 DIAGNOSIS — M069 Rheumatoid arthritis, unspecified: Secondary | ICD-10-CM | POA: Diagnosis not present

## 2018-10-07 DIAGNOSIS — I69354 Hemiplegia and hemiparesis following cerebral infarction affecting left non-dominant side: Secondary | ICD-10-CM | POA: Diagnosis not present

## 2018-10-07 DIAGNOSIS — M4722 Other spondylosis with radiculopathy, cervical region: Secondary | ICD-10-CM | POA: Diagnosis not present

## 2018-10-07 DIAGNOSIS — Z87891 Personal history of nicotine dependence: Secondary | ICD-10-CM | POA: Diagnosis not present

## 2018-10-07 DIAGNOSIS — H3530 Unspecified macular degeneration: Secondary | ICD-10-CM | POA: Diagnosis not present

## 2018-10-10 ENCOUNTER — Telehealth: Payer: Self-pay | Admitting: Family Medicine

## 2018-10-10 DIAGNOSIS — Z85118 Personal history of other malignant neoplasm of bronchus and lung: Secondary | ICD-10-CM | POA: Diagnosis not present

## 2018-10-10 DIAGNOSIS — E1142 Type 2 diabetes mellitus with diabetic polyneuropathy: Secondary | ICD-10-CM | POA: Diagnosis not present

## 2018-10-10 DIAGNOSIS — Z87891 Personal history of nicotine dependence: Secondary | ICD-10-CM | POA: Diagnosis not present

## 2018-10-10 DIAGNOSIS — I69354 Hemiplegia and hemiparesis following cerebral infarction affecting left non-dominant side: Secondary | ICD-10-CM | POA: Diagnosis not present

## 2018-10-10 DIAGNOSIS — H3530 Unspecified macular degeneration: Secondary | ICD-10-CM | POA: Diagnosis not present

## 2018-10-10 DIAGNOSIS — K219 Gastro-esophageal reflux disease without esophagitis: Secondary | ICD-10-CM | POA: Diagnosis not present

## 2018-10-10 DIAGNOSIS — M4722 Other spondylosis with radiculopathy, cervical region: Secondary | ICD-10-CM | POA: Diagnosis not present

## 2018-10-10 DIAGNOSIS — M069 Rheumatoid arthritis, unspecified: Secondary | ICD-10-CM | POA: Diagnosis not present

## 2018-10-10 DIAGNOSIS — J441 Chronic obstructive pulmonary disease with (acute) exacerbation: Secondary | ICD-10-CM | POA: Diagnosis not present

## 2018-10-10 DIAGNOSIS — E039 Hypothyroidism, unspecified: Secondary | ICD-10-CM | POA: Diagnosis not present

## 2018-10-10 DIAGNOSIS — E785 Hyperlipidemia, unspecified: Secondary | ICD-10-CM | POA: Diagnosis not present

## 2018-10-10 DIAGNOSIS — Z7984 Long term (current) use of oral hypoglycemic drugs: Secondary | ICD-10-CM | POA: Diagnosis not present

## 2018-10-10 DIAGNOSIS — E43 Unspecified severe protein-calorie malnutrition: Secondary | ICD-10-CM | POA: Diagnosis not present

## 2018-10-10 DIAGNOSIS — Z9981 Dependence on supplemental oxygen: Secondary | ICD-10-CM | POA: Diagnosis not present

## 2018-10-10 DIAGNOSIS — I2510 Atherosclerotic heart disease of native coronary artery without angina pectoris: Secondary | ICD-10-CM | POA: Diagnosis not present

## 2018-10-10 DIAGNOSIS — I6529 Occlusion and stenosis of unspecified carotid artery: Secondary | ICD-10-CM | POA: Diagnosis not present

## 2018-10-10 DIAGNOSIS — Z7951 Long term (current) use of inhaled steroids: Secondary | ICD-10-CM | POA: Diagnosis not present

## 2018-10-10 DIAGNOSIS — I10 Essential (primary) hypertension: Secondary | ICD-10-CM | POA: Diagnosis not present

## 2018-10-10 NOTE — Telephone Encounter (Signed)
[name] [name] from [facility] called and wanted to let you know that the patient is doing good after her recent hospitalization and that they are going to continue to care for her at this time.   Thank you, [facility]

## 2018-10-12 DIAGNOSIS — R072 Precordial pain: Secondary | ICD-10-CM | POA: Diagnosis not present

## 2018-10-13 ENCOUNTER — Telehealth: Payer: Self-pay | Admitting: Internal Medicine

## 2018-10-13 DIAGNOSIS — I10 Essential (primary) hypertension: Secondary | ICD-10-CM | POA: Diagnosis not present

## 2018-10-13 DIAGNOSIS — E43 Unspecified severe protein-calorie malnutrition: Secondary | ICD-10-CM | POA: Diagnosis not present

## 2018-10-13 DIAGNOSIS — M4722 Other spondylosis with radiculopathy, cervical region: Secondary | ICD-10-CM | POA: Diagnosis not present

## 2018-10-13 DIAGNOSIS — I69354 Hemiplegia and hemiparesis following cerebral infarction affecting left non-dominant side: Secondary | ICD-10-CM | POA: Diagnosis not present

## 2018-10-13 DIAGNOSIS — H3530 Unspecified macular degeneration: Secondary | ICD-10-CM | POA: Diagnosis not present

## 2018-10-13 DIAGNOSIS — E039 Hypothyroidism, unspecified: Secondary | ICD-10-CM | POA: Diagnosis not present

## 2018-10-13 DIAGNOSIS — Z85118 Personal history of other malignant neoplasm of bronchus and lung: Secondary | ICD-10-CM | POA: Diagnosis not present

## 2018-10-13 DIAGNOSIS — J449 Chronic obstructive pulmonary disease, unspecified: Secondary | ICD-10-CM | POA: Diagnosis not present

## 2018-10-13 DIAGNOSIS — Z9981 Dependence on supplemental oxygen: Secondary | ICD-10-CM | POA: Diagnosis not present

## 2018-10-13 DIAGNOSIS — Z87891 Personal history of nicotine dependence: Secondary | ICD-10-CM | POA: Diagnosis not present

## 2018-10-13 DIAGNOSIS — M069 Rheumatoid arthritis, unspecified: Secondary | ICD-10-CM | POA: Diagnosis not present

## 2018-10-13 DIAGNOSIS — E785 Hyperlipidemia, unspecified: Secondary | ICD-10-CM | POA: Diagnosis not present

## 2018-10-13 DIAGNOSIS — Z9181 History of falling: Secondary | ICD-10-CM | POA: Diagnosis not present

## 2018-10-13 DIAGNOSIS — I6529 Occlusion and stenosis of unspecified carotid artery: Secondary | ICD-10-CM | POA: Diagnosis not present

## 2018-10-13 DIAGNOSIS — E1142 Type 2 diabetes mellitus with diabetic polyneuropathy: Secondary | ICD-10-CM | POA: Diagnosis not present

## 2018-10-13 DIAGNOSIS — I2510 Atherosclerotic heart disease of native coronary artery without angina pectoris: Secondary | ICD-10-CM | POA: Diagnosis not present

## 2018-10-13 DIAGNOSIS — Z7984 Long term (current) use of oral hypoglycemic drugs: Secondary | ICD-10-CM | POA: Diagnosis not present

## 2018-10-13 DIAGNOSIS — K219 Gastro-esophageal reflux disease without esophagitis: Secondary | ICD-10-CM | POA: Diagnosis not present

## 2018-10-13 DIAGNOSIS — J441 Chronic obstructive pulmonary disease with (acute) exacerbation: Secondary | ICD-10-CM | POA: Diagnosis not present

## 2018-10-13 NOTE — Telephone Encounter (Signed)

## 2018-10-14 ENCOUNTER — Other Ambulatory Visit: Payer: Self-pay

## 2018-10-14 ENCOUNTER — Ambulatory Visit (INDEPENDENT_AMBULATORY_CARE_PROVIDER_SITE_OTHER): Payer: Medicare Other | Admitting: Internal Medicine

## 2018-10-14 ENCOUNTER — Encounter: Payer: Self-pay | Admitting: Internal Medicine

## 2018-10-14 VITALS — BP 138/70 | HR 61

## 2018-10-14 DIAGNOSIS — J9611 Chronic respiratory failure with hypoxia: Secondary | ICD-10-CM | POA: Diagnosis not present

## 2018-10-14 DIAGNOSIS — J449 Chronic obstructive pulmonary disease, unspecified: Secondary | ICD-10-CM

## 2018-10-14 DIAGNOSIS — I10 Essential (primary) hypertension: Secondary | ICD-10-CM | POA: Diagnosis not present

## 2018-10-14 DIAGNOSIS — K219 Gastro-esophageal reflux disease without esophagitis: Secondary | ICD-10-CM | POA: Diagnosis not present

## 2018-10-14 DIAGNOSIS — I2510 Atherosclerotic heart disease of native coronary artery without angina pectoris: Secondary | ICD-10-CM | POA: Diagnosis not present

## 2018-10-14 DIAGNOSIS — R079 Chest pain, unspecified: Secondary | ICD-10-CM | POA: Diagnosis not present

## 2018-10-14 NOTE — Patient Instructions (Signed)
CONTINUE OXYGEN AS PRESCRIBED  FOLLOW UP WITH DR Maryjane Hurter  FOLLOW UP WITH CARDIOLOGIST

## 2018-10-14 NOTE — Progress Notes (Signed)
Name: [name] [name] MRN: [id number] DOB: Mar 12, 1941     CONSULTATION DATE: 10/14/2018  REFERRING MD : Rosanna Randy  CHIEF COMPLAINT: SOB  HISTORY OF PRESENT ILLNESS: 78 year old female now out 2-1/2 years having completed concurrent chemoradiation therapy as well as PCI for limited stage small cell lung cancer. She is seen today in routine follow-up is doing fairly well. She's recent had a CVA.with MRI showing right ophthalmic subcentimeter meter infarct. She also has widespread intracranial atherosclerotic changes.last CT scan of the chest was back in September showing grossly unchanged focal area of consolidation within the superior segment of the right lower lobe. She scheduled for follow-up CT scan this month. She specifically denies cough hemoptysis or chest tightness.   ONCOLOGY HISTORY -Clinical stage IIIa small cell lung carcinoma of the right upper lobe lung: Patient completed cycle 3 of cisplatin and etoposide on October 24-26, 2017. Given patient's extensive hospitalization after cycle 3, treatment was discontinued.  Previously, MRI of the brain was negative and patient has completed PCI.  CT scan results from December 16, 2017 reviewed independently report as above with no evidence of progressive or recurrent disease.  No intervention is needed at this time.    I saw her 3 and half years ago diagnosed with small cell lung cancer I performed endobronchial ultrasound  She did not follow-up until today Patient is chronic shortness of breath chronic dyspnea exertion She does not use any inhalers due to the fact that she cannot afford them She uses oxygen at nighttime  No signs of exacerbation at this time No signs of infection No indication for steroids No indication for antibiotics    PAST MEDICAL HISTORY :   has a past medical history of Abnormal CT lung screening (10/17/2015), COPD (chronic obstructive pulmonary disease) ([place]), Coronary artery disease, non-occlusive, Diabetes  mellitus, Hyperlipemia, Hypertension, Lung cancer ([place]), Macular degeneration, Personal history of tobacco use, presenting hazards to health (10/15/2015), Pneumonia, Shortness of breath, and Stroke ([place]).  has a past surgical history that includes Cardiac catheterization (05/2004); Cholecystectomy; Vesicovaginal fistula closure w/ TAH; Cataract extraction (Left); Back surgery (80's); Anterior cervical decomp/discectomy fusion (N/A, 10/19/2013); Breast cyst excision (Left); Tonsillectomy and adenoidectomy; Abdominal hysterectomy; Endobronchial ultrasound (N/A, 11/14/2015); Cardiac catheterization (N/A, 12/04/2015); Endarterectomy (Left, 10/24/2014); and PORTA CATH REMOVAL (N/A, 05/17/2017). Prior to Admission medications   Medication Sig Start Date End Date Taking? Authorizing Provider  albuterol (VENTOLIN HFA) 108 (90 Base) MCG/ACT inhaler Inhale 2 puffs into the lungs every 4 (four) hours as needed for wheezing or shortness of breath. 09/26/18   Jerrol Banana., MD  aspirin EC 81 MG EC tablet Take 1 tablet (81 mg total) by mouth daily. 05/08/18   Mayo, Pete Pelt, MD  atorvastatin (LIPITOR) 40 MG tablet Take 1 tablet (40 mg total) by mouth daily at 6 PM. 07/19/18   Jerrol Banana., MD  budesonide-formoterol [facility]) 80-4.5 MCG/ACT inhaler Inhale 2 puffs into the lungs 2 (two) times daily. 09/25/18   Fritzi Mandes, MD  clopidogrel (PLAVIX) 75 MG tablet TAKE 1 TABLET BY MOUTH EVERY DAY 08/30/18   Jerrol Banana., MD  fluticasone [facility]) 50 MCG/ACT nasal spray Place 2 sprays into both nostrils daily. 07/21/17   Bacigalupo, Dionne Bucy, MD  gabapentin (NEURONTIN) 600 MG tablet Take 0.5 tablets (300 mg total) by mouth 2 (two) times daily. 06/02/18   Jerrol Banana., MD  glimepiride (AMARYL) 2 MG tablet Take 1 tablet (2 mg total) by mouth 2 (two) times daily.  06/02/18   Jerrol Banana., MD  isosorbide mononitrate (IMDUR) 30 MG 24 hr tablet TAKE 1 TABLET BY MOUTH  DAILY 04/20/18   Jerrol Banana., MD  LORazepam (ATIVAN) 1 MG tablet Take 1 tablet (1 mg total) by mouth 2 (two) times daily as needed for anxiety. 05/24/17   Jerrol Banana., MD  magnesium oxide (MAG-OX) 400 (241.3 MG) MG tablet Take 400 mg by mouth 2 (two) times daily.  08/04/13   [provider]  metFORMIN (GLUCOPHAGE) 500 MG tablet Take 1 tablet (500 mg total) by mouth 2 (two) times daily with a meal. 07/19/18   Jerrol Banana., MD  metoprolol tartrate (LOPRESSOR) 25 MG tablet Take 1 tablet (25 mg total) by mouth 2 (two) times daily. 06/02/18   Jerrol Banana., MD  mometasone (ELOCON) 0.1 % lotion PLACE 4 DROPS INTO EACH EAR CANAL AT BEDTIME AS NEEDED FOR DRY SKIN/ITCHING/DISCOMFORT. 05/17/18   [provider]  Multiple Vitamins-Minerals (ICAPS) CAPS Take 1 capsule by mouth 2 (two) times daily.     [provider]  nitroGLYCERIN (NITROSTAT) 0.4 MG SL tablet Place 1 tablet (0.4 mg total) under the tongue every 5 (five) minutes as needed for chest pain. 09/22/18   Jerrol Banana., MD  pantoprazole (PROTONIX) 40 MG tablet Take 1 tablet (40 mg total) by mouth daily. 06/02/18   Jerrol Banana., MD   Allergies  Allergen Reactions  . Coconut Fatty Acids Swelling and Other (See Comments)    Throat swells    FAMILY HISTORY:  family history includes Heart attack in her brother, brother, father, and paternal grandmother; Lung cancer in her maternal grandfather; Stroke in her mother. SOCIAL HISTORY:  reports that she quit smoking about 3 years ago. Her smoking use included cigarettes. She has a 50.00 pack-year smoking history. She has never used smokeless tobacco. She reports that she does not drink alcohol or use drugs.    Review of Systems:  Gen:  Denies  fever, sweats, chills weigh loss  HEENT: Denies blurred vision, double vision, ear pain, eye pain, hearing loss, nose bleeds, sore throat Cardiac:  No dizziness, chest pain or heaviness, chest tightness,edema,  No JVD Resp:   No cough, -sputum production, +shortness of breath,-wheezing, -hemoptysis,  Gi: Denies swallowing difficulty, stomach pain, nausea or vomiting, diarrhea, constipation, bowel incontinence Gu:  Denies bladder incontinence, burning urine Ext:   Denies Joint pain, stiffness or swelling Skin: Denies  skin rash, easy bruising or bleeding or hives Endoc:  Denies polyuria, polydipsia , polyphagia or weight change Psych:   Denies depression, insomnia or hallucinations  Other:  All other systems negative   BP 138/70 (BP Location: Left Arm, Cuff Size: Normal)   Pulse 61   SpO2 96%     Physical Examination:   GENERAL:NAD, no fevers, chills, no weakness no fatigue HEAD: Normocephalic, atraumatic.  EYES: PERLA, EOMI No scleral icterus.  MOUTH: Moist mucosal membrane.  EAR, NOSE, THROAT: Clear without exudates. No external lesions.  NECK: Supple. No thyromegaly.  No JVD.  PULMONARY: CTA B/L no wheezing, rhonchi, crackles CARDIOVASCULAR: S1 and S2. Regular rate and rhythm. No murmurs GASTROINTESTINAL: Soft, nontender, nondistended. Positive bowel sounds.  MUSCULOSKELETAL: No swelling, clubbing, or edema.  NEUROLOGIC: No gross focal neurological deficits. 5/5 strength all extremities SKIN: No ulceration, lesions, rashes, or cyanosis.  PSYCHIATRIC: Insight, judgment intact. -depression -anxiety ALL OTHER ROS ARE NEGATIVE   MEDICATIONS: I have reviewed all medications and  confirmed regimen as documented   CT images March 2020 Images reviewed by me today 5 mm right upper lobe nodule stable over the last 1 year Resolving right lower lobe nodular opacity decreasing in size consistent with inflammatory infectious process   CT images CT images March 2020 images reviewed by me today IMAGING      Chest x-ray images from September 22, 2018 reviewed by me today Flattened diaphragms bilaterally hyperexpanded lung volumes Findings are consistent with COPD emphysema  ASSESSMENT AND  PLAN SYNOPSIS 78 year old pleasant white female seen today for follow-up COPD and history of small cell lung cancer Patient was diagnosed with lung cancer 3 years ago At this time her COPD seems to be stable and under control with oxygen therapy,She does not want any inhaler therapy as she cannot afford them   Shortness of breath and dyspnea exertion related to her COPD and deconditioned state Recommend pulmonary rehab referral at some point   COPD Gold stage D She is not on any inhaler therapy at this time  Chronic hypoxic respiratory failure from COPD Continue oxygen as prescribed Patient uses and benefits from oxygen therapy She needs this for survival  Lung cancer small cell lung cancer Follow-up with oncology and radiation oncology as scheduled CT images reviewed there is no recurrence at this time    COVID-19 EDUCATION: The signs and symptoms of COVID-19 were discussed with the patient and how to seek care for testing.  The importance of social distancing was discussed today. Hand Washing Techniques and avoid touching face was advised.  MEDICATION ADJUSTMENTS/LABS AND TESTS ORDERED: Continue oxygen as prescribed   CURRENT MEDICATIONS REVIEWED AT LENGTH WITH PATIENT TODAY   Patient satisfied with Plan of action and management. All questions answered  Follow up in 1 year   Amarianna Abplanalp Patricia Pesa, M.D.  [facility]  Medical Director [place] Director [facility] Cardio-Pulmonary Department

## 2018-10-15 DIAGNOSIS — E785 Hyperlipidemia, unspecified: Secondary | ICD-10-CM | POA: Diagnosis not present

## 2018-10-15 DIAGNOSIS — M4722 Other spondylosis with radiculopathy, cervical region: Secondary | ICD-10-CM | POA: Diagnosis not present

## 2018-10-15 DIAGNOSIS — Z9181 History of falling: Secondary | ICD-10-CM | POA: Diagnosis not present

## 2018-10-15 DIAGNOSIS — I2510 Atherosclerotic heart disease of native coronary artery without angina pectoris: Secondary | ICD-10-CM | POA: Diagnosis not present

## 2018-10-15 DIAGNOSIS — Z9981 Dependence on supplemental oxygen: Secondary | ICD-10-CM | POA: Diagnosis not present

## 2018-10-15 DIAGNOSIS — I69354 Hemiplegia and hemiparesis following cerebral infarction affecting left non-dominant side: Secondary | ICD-10-CM | POA: Diagnosis not present

## 2018-10-15 DIAGNOSIS — E43 Unspecified severe protein-calorie malnutrition: Secondary | ICD-10-CM | POA: Diagnosis not present

## 2018-10-15 DIAGNOSIS — Z87891 Personal history of nicotine dependence: Secondary | ICD-10-CM | POA: Diagnosis not present

## 2018-10-15 DIAGNOSIS — J449 Chronic obstructive pulmonary disease, unspecified: Secondary | ICD-10-CM | POA: Diagnosis not present

## 2018-10-15 DIAGNOSIS — I6529 Occlusion and stenosis of unspecified carotid artery: Secondary | ICD-10-CM | POA: Diagnosis not present

## 2018-10-15 DIAGNOSIS — Z85118 Personal history of other malignant neoplasm of bronchus and lung: Secondary | ICD-10-CM | POA: Diagnosis not present

## 2018-10-15 DIAGNOSIS — E039 Hypothyroidism, unspecified: Secondary | ICD-10-CM | POA: Diagnosis not present

## 2018-10-15 DIAGNOSIS — H3530 Unspecified macular degeneration: Secondary | ICD-10-CM | POA: Diagnosis not present

## 2018-10-15 DIAGNOSIS — M069 Rheumatoid arthritis, unspecified: Secondary | ICD-10-CM | POA: Diagnosis not present

## 2018-10-15 DIAGNOSIS — Z7984 Long term (current) use of oral hypoglycemic drugs: Secondary | ICD-10-CM | POA: Diagnosis not present

## 2018-10-15 DIAGNOSIS — E1142 Type 2 diabetes mellitus with diabetic polyneuropathy: Secondary | ICD-10-CM | POA: Diagnosis not present

## 2018-10-15 DIAGNOSIS — K219 Gastro-esophageal reflux disease without esophagitis: Secondary | ICD-10-CM | POA: Diagnosis not present

## 2018-10-15 DIAGNOSIS — I10 Essential (primary) hypertension: Secondary | ICD-10-CM | POA: Diagnosis not present

## 2018-10-15 DIAGNOSIS — J441 Chronic obstructive pulmonary disease with (acute) exacerbation: Secondary | ICD-10-CM | POA: Diagnosis not present

## 2018-10-17 ENCOUNTER — Telehealth: Payer: Self-pay | Admitting: Family Medicine

## 2018-10-17 DIAGNOSIS — E43 Unspecified severe protein-calorie malnutrition: Secondary | ICD-10-CM | POA: Diagnosis not present

## 2018-10-17 DIAGNOSIS — I2510 Atherosclerotic heart disease of native coronary artery without angina pectoris: Secondary | ICD-10-CM | POA: Diagnosis not present

## 2018-10-17 DIAGNOSIS — M069 Rheumatoid arthritis, unspecified: Secondary | ICD-10-CM | POA: Diagnosis not present

## 2018-10-17 DIAGNOSIS — I6529 Occlusion and stenosis of unspecified carotid artery: Secondary | ICD-10-CM | POA: Diagnosis not present

## 2018-10-17 DIAGNOSIS — J449 Chronic obstructive pulmonary disease, unspecified: Secondary | ICD-10-CM | POA: Diagnosis not present

## 2018-10-17 DIAGNOSIS — E039 Hypothyroidism, unspecified: Secondary | ICD-10-CM | POA: Diagnosis not present

## 2018-10-17 DIAGNOSIS — H3530 Unspecified macular degeneration: Secondary | ICD-10-CM | POA: Diagnosis not present

## 2018-10-17 DIAGNOSIS — E1142 Type 2 diabetes mellitus with diabetic polyneuropathy: Secondary | ICD-10-CM | POA: Diagnosis not present

## 2018-10-17 DIAGNOSIS — M4722 Other spondylosis with radiculopathy, cervical region: Secondary | ICD-10-CM | POA: Diagnosis not present

## 2018-10-17 DIAGNOSIS — I10 Essential (primary) hypertension: Secondary | ICD-10-CM | POA: Diagnosis not present

## 2018-10-17 DIAGNOSIS — I69354 Hemiplegia and hemiparesis following cerebral infarction affecting left non-dominant side: Secondary | ICD-10-CM | POA: Diagnosis not present

## 2018-10-17 DIAGNOSIS — Z85118 Personal history of other malignant neoplasm of bronchus and lung: Secondary | ICD-10-CM | POA: Diagnosis not present

## 2018-10-17 DIAGNOSIS — Z9981 Dependence on supplemental oxygen: Secondary | ICD-10-CM | POA: Diagnosis not present

## 2018-10-17 DIAGNOSIS — J441 Chronic obstructive pulmonary disease with (acute) exacerbation: Secondary | ICD-10-CM | POA: Diagnosis not present

## 2018-10-17 DIAGNOSIS — Z7984 Long term (current) use of oral hypoglycemic drugs: Secondary | ICD-10-CM | POA: Diagnosis not present

## 2018-10-17 DIAGNOSIS — K219 Gastro-esophageal reflux disease without esophagitis: Secondary | ICD-10-CM | POA: Diagnosis not present

## 2018-10-17 DIAGNOSIS — Z9181 History of falling: Secondary | ICD-10-CM | POA: Diagnosis not present

## 2018-10-17 DIAGNOSIS — E785 Hyperlipidemia, unspecified: Secondary | ICD-10-CM | POA: Diagnosis not present

## 2018-10-17 DIAGNOSIS — Z87891 Personal history of nicotine dependence: Secondary | ICD-10-CM | POA: Diagnosis not present

## 2018-10-17 NOTE — Telephone Encounter (Signed)
PTs husband called saying her cardiologist added these two changes and or drugas to her list  Sucralfate  Isosorbide changed to 60 mg  This is just an [employer]

## 2018-10-17 NOTE — Telephone Encounter (Signed)
error 

## 2018-10-17 NOTE — Telephone Encounter (Signed)
Noted changes.

## 2018-10-18 DIAGNOSIS — Z87891 Personal history of nicotine dependence: Secondary | ICD-10-CM | POA: Diagnosis not present

## 2018-10-18 DIAGNOSIS — E43 Unspecified severe protein-calorie malnutrition: Secondary | ICD-10-CM | POA: Diagnosis not present

## 2018-10-18 DIAGNOSIS — Z9181 History of falling: Secondary | ICD-10-CM | POA: Diagnosis not present

## 2018-10-18 DIAGNOSIS — K219 Gastro-esophageal reflux disease without esophagitis: Secondary | ICD-10-CM | POA: Diagnosis not present

## 2018-10-18 DIAGNOSIS — I2510 Atherosclerotic heart disease of native coronary artery without angina pectoris: Secondary | ICD-10-CM | POA: Diagnosis not present

## 2018-10-18 DIAGNOSIS — J441 Chronic obstructive pulmonary disease with (acute) exacerbation: Secondary | ICD-10-CM | POA: Diagnosis not present

## 2018-10-18 DIAGNOSIS — M4722 Other spondylosis with radiculopathy, cervical region: Secondary | ICD-10-CM | POA: Diagnosis not present

## 2018-10-18 DIAGNOSIS — E785 Hyperlipidemia, unspecified: Secondary | ICD-10-CM | POA: Diagnosis not present

## 2018-10-18 DIAGNOSIS — Z85118 Personal history of other malignant neoplasm of bronchus and lung: Secondary | ICD-10-CM | POA: Diagnosis not present

## 2018-10-18 DIAGNOSIS — Z9981 Dependence on supplemental oxygen: Secondary | ICD-10-CM | POA: Diagnosis not present

## 2018-10-18 DIAGNOSIS — I69354 Hemiplegia and hemiparesis following cerebral infarction affecting left non-dominant side: Secondary | ICD-10-CM | POA: Diagnosis not present

## 2018-10-18 DIAGNOSIS — I6529 Occlusion and stenosis of unspecified carotid artery: Secondary | ICD-10-CM | POA: Diagnosis not present

## 2018-10-18 DIAGNOSIS — I10 Essential (primary) hypertension: Secondary | ICD-10-CM | POA: Diagnosis not present

## 2018-10-18 DIAGNOSIS — E1142 Type 2 diabetes mellitus with diabetic polyneuropathy: Secondary | ICD-10-CM | POA: Diagnosis not present

## 2018-10-18 DIAGNOSIS — Z7984 Long term (current) use of oral hypoglycemic drugs: Secondary | ICD-10-CM | POA: Diagnosis not present

## 2018-10-18 DIAGNOSIS — J449 Chronic obstructive pulmonary disease, unspecified: Secondary | ICD-10-CM | POA: Diagnosis not present

## 2018-10-18 DIAGNOSIS — M069 Rheumatoid arthritis, unspecified: Secondary | ICD-10-CM | POA: Diagnosis not present

## 2018-10-18 DIAGNOSIS — E039 Hypothyroidism, unspecified: Secondary | ICD-10-CM | POA: Diagnosis not present

## 2018-10-18 DIAGNOSIS — H3530 Unspecified macular degeneration: Secondary | ICD-10-CM | POA: Diagnosis not present

## 2018-10-20 DIAGNOSIS — K219 Gastro-esophageal reflux disease without esophagitis: Secondary | ICD-10-CM | POA: Diagnosis not present

## 2018-10-20 DIAGNOSIS — E785 Hyperlipidemia, unspecified: Secondary | ICD-10-CM | POA: Diagnosis not present

## 2018-10-20 DIAGNOSIS — Z87891 Personal history of nicotine dependence: Secondary | ICD-10-CM | POA: Diagnosis not present

## 2018-10-20 DIAGNOSIS — J441 Chronic obstructive pulmonary disease with (acute) exacerbation: Secondary | ICD-10-CM | POA: Diagnosis not present

## 2018-10-20 DIAGNOSIS — M4722 Other spondylosis with radiculopathy, cervical region: Secondary | ICD-10-CM | POA: Diagnosis not present

## 2018-10-20 DIAGNOSIS — Z9181 History of falling: Secondary | ICD-10-CM | POA: Diagnosis not present

## 2018-10-20 DIAGNOSIS — Z9981 Dependence on supplemental oxygen: Secondary | ICD-10-CM | POA: Diagnosis not present

## 2018-10-20 DIAGNOSIS — I69354 Hemiplegia and hemiparesis following cerebral infarction affecting left non-dominant side: Secondary | ICD-10-CM | POA: Diagnosis not present

## 2018-10-20 DIAGNOSIS — H3530 Unspecified macular degeneration: Secondary | ICD-10-CM | POA: Diagnosis not present

## 2018-10-20 DIAGNOSIS — J449 Chronic obstructive pulmonary disease, unspecified: Secondary | ICD-10-CM | POA: Diagnosis not present

## 2018-10-20 DIAGNOSIS — M069 Rheumatoid arthritis, unspecified: Secondary | ICD-10-CM | POA: Diagnosis not present

## 2018-10-20 DIAGNOSIS — E039 Hypothyroidism, unspecified: Secondary | ICD-10-CM | POA: Diagnosis not present

## 2018-10-20 DIAGNOSIS — Z85118 Personal history of other malignant neoplasm of bronchus and lung: Secondary | ICD-10-CM | POA: Diagnosis not present

## 2018-10-20 DIAGNOSIS — I6529 Occlusion and stenosis of unspecified carotid artery: Secondary | ICD-10-CM | POA: Diagnosis not present

## 2018-10-20 DIAGNOSIS — E43 Unspecified severe protein-calorie malnutrition: Secondary | ICD-10-CM | POA: Diagnosis not present

## 2018-10-20 DIAGNOSIS — E1142 Type 2 diabetes mellitus with diabetic polyneuropathy: Secondary | ICD-10-CM | POA: Diagnosis not present

## 2018-10-20 DIAGNOSIS — I2510 Atherosclerotic heart disease of native coronary artery without angina pectoris: Secondary | ICD-10-CM | POA: Diagnosis not present

## 2018-10-20 DIAGNOSIS — I10 Essential (primary) hypertension: Secondary | ICD-10-CM | POA: Diagnosis not present

## 2018-10-20 DIAGNOSIS — Z7984 Long term (current) use of oral hypoglycemic drugs: Secondary | ICD-10-CM | POA: Diagnosis not present

## 2018-10-21 DIAGNOSIS — M4722 Other spondylosis with radiculopathy, cervical region: Secondary | ICD-10-CM | POA: Diagnosis not present

## 2018-10-21 DIAGNOSIS — E1142 Type 2 diabetes mellitus with diabetic polyneuropathy: Secondary | ICD-10-CM | POA: Diagnosis not present

## 2018-10-21 DIAGNOSIS — Z87891 Personal history of nicotine dependence: Secondary | ICD-10-CM | POA: Diagnosis not present

## 2018-10-21 DIAGNOSIS — I6529 Occlusion and stenosis of unspecified carotid artery: Secondary | ICD-10-CM | POA: Diagnosis not present

## 2018-10-21 DIAGNOSIS — I2510 Atherosclerotic heart disease of native coronary artery without angina pectoris: Secondary | ICD-10-CM | POA: Diagnosis not present

## 2018-10-21 DIAGNOSIS — E039 Hypothyroidism, unspecified: Secondary | ICD-10-CM | POA: Diagnosis not present

## 2018-10-21 DIAGNOSIS — Z7984 Long term (current) use of oral hypoglycemic drugs: Secondary | ICD-10-CM | POA: Diagnosis not present

## 2018-10-21 DIAGNOSIS — Z85118 Personal history of other malignant neoplasm of bronchus and lung: Secondary | ICD-10-CM | POA: Diagnosis not present

## 2018-10-21 DIAGNOSIS — Z9981 Dependence on supplemental oxygen: Secondary | ICD-10-CM | POA: Diagnosis not present

## 2018-10-21 DIAGNOSIS — Z9181 History of falling: Secondary | ICD-10-CM | POA: Diagnosis not present

## 2018-10-21 DIAGNOSIS — J441 Chronic obstructive pulmonary disease with (acute) exacerbation: Secondary | ICD-10-CM | POA: Diagnosis not present

## 2018-10-21 DIAGNOSIS — M069 Rheumatoid arthritis, unspecified: Secondary | ICD-10-CM | POA: Diagnosis not present

## 2018-10-21 DIAGNOSIS — H3530 Unspecified macular degeneration: Secondary | ICD-10-CM | POA: Diagnosis not present

## 2018-10-21 DIAGNOSIS — E43 Unspecified severe protein-calorie malnutrition: Secondary | ICD-10-CM | POA: Diagnosis not present

## 2018-10-21 DIAGNOSIS — I10 Essential (primary) hypertension: Secondary | ICD-10-CM | POA: Diagnosis not present

## 2018-10-21 DIAGNOSIS — I69354 Hemiplegia and hemiparesis following cerebral infarction affecting left non-dominant side: Secondary | ICD-10-CM | POA: Diagnosis not present

## 2018-10-21 DIAGNOSIS — E785 Hyperlipidemia, unspecified: Secondary | ICD-10-CM | POA: Diagnosis not present

## 2018-10-21 DIAGNOSIS — K219 Gastro-esophageal reflux disease without esophagitis: Secondary | ICD-10-CM | POA: Diagnosis not present

## 2018-10-21 DIAGNOSIS — J449 Chronic obstructive pulmonary disease, unspecified: Secondary | ICD-10-CM | POA: Diagnosis not present

## 2018-10-24 DIAGNOSIS — I2510 Atherosclerotic heart disease of native coronary artery without angina pectoris: Secondary | ICD-10-CM | POA: Diagnosis not present

## 2018-10-24 DIAGNOSIS — I69354 Hemiplegia and hemiparesis following cerebral infarction affecting left non-dominant side: Secondary | ICD-10-CM | POA: Diagnosis not present

## 2018-10-24 DIAGNOSIS — Z9981 Dependence on supplemental oxygen: Secondary | ICD-10-CM | POA: Diagnosis not present

## 2018-10-24 DIAGNOSIS — Z87891 Personal history of nicotine dependence: Secondary | ICD-10-CM | POA: Diagnosis not present

## 2018-10-24 DIAGNOSIS — I10 Essential (primary) hypertension: Secondary | ICD-10-CM | POA: Diagnosis not present

## 2018-10-24 DIAGNOSIS — J449 Chronic obstructive pulmonary disease, unspecified: Secondary | ICD-10-CM | POA: Diagnosis not present

## 2018-10-24 DIAGNOSIS — E785 Hyperlipidemia, unspecified: Secondary | ICD-10-CM | POA: Diagnosis not present

## 2018-10-24 DIAGNOSIS — Z9181 History of falling: Secondary | ICD-10-CM | POA: Diagnosis not present

## 2018-10-24 DIAGNOSIS — Z7984 Long term (current) use of oral hypoglycemic drugs: Secondary | ICD-10-CM | POA: Diagnosis not present

## 2018-10-24 DIAGNOSIS — Z85118 Personal history of other malignant neoplasm of bronchus and lung: Secondary | ICD-10-CM | POA: Diagnosis not present

## 2018-10-24 DIAGNOSIS — E1142 Type 2 diabetes mellitus with diabetic polyneuropathy: Secondary | ICD-10-CM | POA: Diagnosis not present

## 2018-10-24 DIAGNOSIS — J441 Chronic obstructive pulmonary disease with (acute) exacerbation: Secondary | ICD-10-CM | POA: Diagnosis not present

## 2018-10-24 DIAGNOSIS — I6529 Occlusion and stenosis of unspecified carotid artery: Secondary | ICD-10-CM | POA: Diagnosis not present

## 2018-10-24 DIAGNOSIS — M4722 Other spondylosis with radiculopathy, cervical region: Secondary | ICD-10-CM | POA: Diagnosis not present

## 2018-10-24 DIAGNOSIS — E43 Unspecified severe protein-calorie malnutrition: Secondary | ICD-10-CM | POA: Diagnosis not present

## 2018-10-24 DIAGNOSIS — K219 Gastro-esophageal reflux disease without esophagitis: Secondary | ICD-10-CM | POA: Diagnosis not present

## 2018-10-24 DIAGNOSIS — M069 Rheumatoid arthritis, unspecified: Secondary | ICD-10-CM | POA: Diagnosis not present

## 2018-10-24 DIAGNOSIS — E039 Hypothyroidism, unspecified: Secondary | ICD-10-CM | POA: Diagnosis not present

## 2018-10-24 DIAGNOSIS — R0602 Shortness of breath: Secondary | ICD-10-CM | POA: Diagnosis not present

## 2018-10-24 DIAGNOSIS — H3530 Unspecified macular degeneration: Secondary | ICD-10-CM | POA: Diagnosis not present

## 2018-10-25 DIAGNOSIS — Z7984 Long term (current) use of oral hypoglycemic drugs: Secondary | ICD-10-CM | POA: Diagnosis not present

## 2018-10-25 DIAGNOSIS — K219 Gastro-esophageal reflux disease without esophagitis: Secondary | ICD-10-CM | POA: Diagnosis not present

## 2018-10-25 DIAGNOSIS — E785 Hyperlipidemia, unspecified: Secondary | ICD-10-CM | POA: Diagnosis not present

## 2018-10-25 DIAGNOSIS — M4722 Other spondylosis with radiculopathy, cervical region: Secondary | ICD-10-CM | POA: Diagnosis not present

## 2018-10-25 DIAGNOSIS — I2510 Atherosclerotic heart disease of native coronary artery without angina pectoris: Secondary | ICD-10-CM | POA: Diagnosis not present

## 2018-10-25 DIAGNOSIS — I69354 Hemiplegia and hemiparesis following cerebral infarction affecting left non-dominant side: Secondary | ICD-10-CM | POA: Diagnosis not present

## 2018-10-25 DIAGNOSIS — I6529 Occlusion and stenosis of unspecified carotid artery: Secondary | ICD-10-CM | POA: Diagnosis not present

## 2018-10-25 DIAGNOSIS — H3530 Unspecified macular degeneration: Secondary | ICD-10-CM | POA: Diagnosis not present

## 2018-10-25 DIAGNOSIS — J449 Chronic obstructive pulmonary disease, unspecified: Secondary | ICD-10-CM | POA: Diagnosis not present

## 2018-10-25 DIAGNOSIS — Z87891 Personal history of nicotine dependence: Secondary | ICD-10-CM | POA: Diagnosis not present

## 2018-10-25 DIAGNOSIS — E43 Unspecified severe protein-calorie malnutrition: Secondary | ICD-10-CM | POA: Diagnosis not present

## 2018-10-25 DIAGNOSIS — I10 Essential (primary) hypertension: Secondary | ICD-10-CM | POA: Diagnosis not present

## 2018-10-25 DIAGNOSIS — Z85118 Personal history of other malignant neoplasm of bronchus and lung: Secondary | ICD-10-CM | POA: Diagnosis not present

## 2018-10-25 DIAGNOSIS — J441 Chronic obstructive pulmonary disease with (acute) exacerbation: Secondary | ICD-10-CM | POA: Diagnosis not present

## 2018-10-25 DIAGNOSIS — Z9181 History of falling: Secondary | ICD-10-CM | POA: Diagnosis not present

## 2018-10-25 DIAGNOSIS — Z9981 Dependence on supplemental oxygen: Secondary | ICD-10-CM | POA: Diagnosis not present

## 2018-10-25 DIAGNOSIS — M069 Rheumatoid arthritis, unspecified: Secondary | ICD-10-CM | POA: Diagnosis not present

## 2018-10-25 DIAGNOSIS — E039 Hypothyroidism, unspecified: Secondary | ICD-10-CM | POA: Diagnosis not present

## 2018-10-25 DIAGNOSIS — E1142 Type 2 diabetes mellitus with diabetic polyneuropathy: Secondary | ICD-10-CM | POA: Diagnosis not present

## 2018-10-26 DIAGNOSIS — J449 Chronic obstructive pulmonary disease, unspecified: Secondary | ICD-10-CM | POA: Diagnosis not present

## 2018-10-27 DIAGNOSIS — Z9181 History of falling: Secondary | ICD-10-CM | POA: Diagnosis not present

## 2018-10-27 DIAGNOSIS — E43 Unspecified severe protein-calorie malnutrition: Secondary | ICD-10-CM | POA: Diagnosis not present

## 2018-10-27 DIAGNOSIS — I10 Essential (primary) hypertension: Secondary | ICD-10-CM | POA: Diagnosis not present

## 2018-10-27 DIAGNOSIS — J441 Chronic obstructive pulmonary disease with (acute) exacerbation: Secondary | ICD-10-CM | POA: Diagnosis not present

## 2018-10-27 DIAGNOSIS — K219 Gastro-esophageal reflux disease without esophagitis: Secondary | ICD-10-CM | POA: Diagnosis not present

## 2018-10-27 DIAGNOSIS — H3530 Unspecified macular degeneration: Secondary | ICD-10-CM | POA: Diagnosis not present

## 2018-10-27 DIAGNOSIS — I6529 Occlusion and stenosis of unspecified carotid artery: Secondary | ICD-10-CM | POA: Diagnosis not present

## 2018-10-27 DIAGNOSIS — E785 Hyperlipidemia, unspecified: Secondary | ICD-10-CM | POA: Diagnosis not present

## 2018-10-27 DIAGNOSIS — J449 Chronic obstructive pulmonary disease, unspecified: Secondary | ICD-10-CM | POA: Diagnosis not present

## 2018-10-27 DIAGNOSIS — E1142 Type 2 diabetes mellitus with diabetic polyneuropathy: Secondary | ICD-10-CM | POA: Diagnosis not present

## 2018-10-27 DIAGNOSIS — Z9981 Dependence on supplemental oxygen: Secondary | ICD-10-CM | POA: Diagnosis not present

## 2018-10-27 DIAGNOSIS — I2510 Atherosclerotic heart disease of native coronary artery without angina pectoris: Secondary | ICD-10-CM | POA: Diagnosis not present

## 2018-10-27 DIAGNOSIS — E039 Hypothyroidism, unspecified: Secondary | ICD-10-CM | POA: Diagnosis not present

## 2018-10-27 DIAGNOSIS — M4722 Other spondylosis with radiculopathy, cervical region: Secondary | ICD-10-CM | POA: Diagnosis not present

## 2018-10-27 DIAGNOSIS — Z87891 Personal history of nicotine dependence: Secondary | ICD-10-CM | POA: Diagnosis not present

## 2018-10-27 DIAGNOSIS — I69354 Hemiplegia and hemiparesis following cerebral infarction affecting left non-dominant side: Secondary | ICD-10-CM | POA: Diagnosis not present

## 2018-10-27 DIAGNOSIS — M069 Rheumatoid arthritis, unspecified: Secondary | ICD-10-CM | POA: Diagnosis not present

## 2018-10-27 DIAGNOSIS — Z7984 Long term (current) use of oral hypoglycemic drugs: Secondary | ICD-10-CM | POA: Diagnosis not present

## 2018-10-27 DIAGNOSIS — Z85118 Personal history of other malignant neoplasm of bronchus and lung: Secondary | ICD-10-CM | POA: Diagnosis not present

## 2018-10-31 DIAGNOSIS — Z87891 Personal history of nicotine dependence: Secondary | ICD-10-CM | POA: Diagnosis not present

## 2018-10-31 DIAGNOSIS — I2510 Atherosclerotic heart disease of native coronary artery without angina pectoris: Secondary | ICD-10-CM | POA: Diagnosis not present

## 2018-10-31 DIAGNOSIS — H3530 Unspecified macular degeneration: Secondary | ICD-10-CM | POA: Diagnosis not present

## 2018-10-31 DIAGNOSIS — E039 Hypothyroidism, unspecified: Secondary | ICD-10-CM | POA: Diagnosis not present

## 2018-10-31 DIAGNOSIS — J449 Chronic obstructive pulmonary disease, unspecified: Secondary | ICD-10-CM | POA: Diagnosis not present

## 2018-10-31 DIAGNOSIS — I6529 Occlusion and stenosis of unspecified carotid artery: Secondary | ICD-10-CM | POA: Diagnosis not present

## 2018-10-31 DIAGNOSIS — I69254 Hemiplegia and hemiparesis following other nontraumatic intracranial hemorrhage affecting left non-dominant side: Secondary | ICD-10-CM | POA: Diagnosis not present

## 2018-10-31 DIAGNOSIS — Z9181 History of falling: Secondary | ICD-10-CM | POA: Diagnosis not present

## 2018-10-31 DIAGNOSIS — E785 Hyperlipidemia, unspecified: Secondary | ICD-10-CM | POA: Diagnosis not present

## 2018-10-31 DIAGNOSIS — E43 Unspecified severe protein-calorie malnutrition: Secondary | ICD-10-CM | POA: Diagnosis not present

## 2018-10-31 DIAGNOSIS — M069 Rheumatoid arthritis, unspecified: Secondary | ICD-10-CM | POA: Diagnosis not present

## 2018-10-31 DIAGNOSIS — Z85118 Personal history of other malignant neoplasm of bronchus and lung: Secondary | ICD-10-CM | POA: Diagnosis not present

## 2018-10-31 DIAGNOSIS — I69354 Hemiplegia and hemiparesis following cerebral infarction affecting left non-dominant side: Secondary | ICD-10-CM | POA: Diagnosis not present

## 2018-10-31 DIAGNOSIS — Z9981 Dependence on supplemental oxygen: Secondary | ICD-10-CM | POA: Diagnosis not present

## 2018-10-31 DIAGNOSIS — E1142 Type 2 diabetes mellitus with diabetic polyneuropathy: Secondary | ICD-10-CM | POA: Diagnosis not present

## 2018-10-31 DIAGNOSIS — Z7984 Long term (current) use of oral hypoglycemic drugs: Secondary | ICD-10-CM | POA: Diagnosis not present

## 2018-10-31 DIAGNOSIS — K219 Gastro-esophageal reflux disease without esophagitis: Secondary | ICD-10-CM | POA: Diagnosis not present

## 2018-10-31 DIAGNOSIS — J441 Chronic obstructive pulmonary disease with (acute) exacerbation: Secondary | ICD-10-CM | POA: Diagnosis not present

## 2018-10-31 DIAGNOSIS — M4722 Other spondylosis with radiculopathy, cervical region: Secondary | ICD-10-CM | POA: Diagnosis not present

## 2018-10-31 DIAGNOSIS — I10 Essential (primary) hypertension: Secondary | ICD-10-CM | POA: Diagnosis not present

## 2018-11-01 ENCOUNTER — Other Ambulatory Visit: Payer: Self-pay | Admitting: Family Medicine

## 2018-11-01 DIAGNOSIS — J441 Chronic obstructive pulmonary disease with (acute) exacerbation: Secondary | ICD-10-CM | POA: Diagnosis not present

## 2018-11-01 DIAGNOSIS — Z9181 History of falling: Secondary | ICD-10-CM | POA: Diagnosis not present

## 2018-11-01 DIAGNOSIS — Z87891 Personal history of nicotine dependence: Secondary | ICD-10-CM | POA: Diagnosis not present

## 2018-11-01 DIAGNOSIS — I6529 Occlusion and stenosis of unspecified carotid artery: Secondary | ICD-10-CM | POA: Diagnosis not present

## 2018-11-01 DIAGNOSIS — E43 Unspecified severe protein-calorie malnutrition: Secondary | ICD-10-CM | POA: Diagnosis not present

## 2018-11-01 DIAGNOSIS — H3530 Unspecified macular degeneration: Secondary | ICD-10-CM | POA: Diagnosis not present

## 2018-11-01 DIAGNOSIS — E1142 Type 2 diabetes mellitus with diabetic polyneuropathy: Secondary | ICD-10-CM | POA: Diagnosis not present

## 2018-11-01 DIAGNOSIS — I10 Essential (primary) hypertension: Secondary | ICD-10-CM | POA: Diagnosis not present

## 2018-11-01 DIAGNOSIS — Z85118 Personal history of other malignant neoplasm of bronchus and lung: Secondary | ICD-10-CM | POA: Diagnosis not present

## 2018-11-01 DIAGNOSIS — K219 Gastro-esophageal reflux disease without esophagitis: Secondary | ICD-10-CM | POA: Diagnosis not present

## 2018-11-01 DIAGNOSIS — I2510 Atherosclerotic heart disease of native coronary artery without angina pectoris: Secondary | ICD-10-CM | POA: Diagnosis not present

## 2018-11-01 DIAGNOSIS — Z7984 Long term (current) use of oral hypoglycemic drugs: Secondary | ICD-10-CM | POA: Diagnosis not present

## 2018-11-01 DIAGNOSIS — E039 Hypothyroidism, unspecified: Secondary | ICD-10-CM | POA: Diagnosis not present

## 2018-11-01 DIAGNOSIS — I69354 Hemiplegia and hemiparesis following cerebral infarction affecting left non-dominant side: Secondary | ICD-10-CM | POA: Diagnosis not present

## 2018-11-01 DIAGNOSIS — J449 Chronic obstructive pulmonary disease, unspecified: Secondary | ICD-10-CM | POA: Diagnosis not present

## 2018-11-01 DIAGNOSIS — Z9981 Dependence on supplemental oxygen: Secondary | ICD-10-CM | POA: Diagnosis not present

## 2018-11-01 DIAGNOSIS — E785 Hyperlipidemia, unspecified: Secondary | ICD-10-CM | POA: Diagnosis not present

## 2018-11-01 DIAGNOSIS — M4722 Other spondylosis with radiculopathy, cervical region: Secondary | ICD-10-CM | POA: Diagnosis not present

## 2018-11-01 DIAGNOSIS — M069 Rheumatoid arthritis, unspecified: Secondary | ICD-10-CM | POA: Diagnosis not present

## 2018-11-07 DIAGNOSIS — M069 Rheumatoid arthritis, unspecified: Secondary | ICD-10-CM | POA: Diagnosis not present

## 2018-11-07 DIAGNOSIS — M4722 Other spondylosis with radiculopathy, cervical region: Secondary | ICD-10-CM | POA: Diagnosis not present

## 2018-11-07 DIAGNOSIS — Z7984 Long term (current) use of oral hypoglycemic drugs: Secondary | ICD-10-CM | POA: Diagnosis not present

## 2018-11-07 DIAGNOSIS — I6529 Occlusion and stenosis of unspecified carotid artery: Secondary | ICD-10-CM | POA: Diagnosis not present

## 2018-11-07 DIAGNOSIS — E785 Hyperlipidemia, unspecified: Secondary | ICD-10-CM | POA: Diagnosis not present

## 2018-11-07 DIAGNOSIS — J449 Chronic obstructive pulmonary disease, unspecified: Secondary | ICD-10-CM | POA: Diagnosis not present

## 2018-11-07 DIAGNOSIS — J441 Chronic obstructive pulmonary disease with (acute) exacerbation: Secondary | ICD-10-CM | POA: Diagnosis not present

## 2018-11-07 DIAGNOSIS — I2510 Atherosclerotic heart disease of native coronary artery without angina pectoris: Secondary | ICD-10-CM | POA: Diagnosis not present

## 2018-11-07 DIAGNOSIS — Z9181 History of falling: Secondary | ICD-10-CM | POA: Diagnosis not present

## 2018-11-07 DIAGNOSIS — E43 Unspecified severe protein-calorie malnutrition: Secondary | ICD-10-CM | POA: Diagnosis not present

## 2018-11-07 DIAGNOSIS — Z85118 Personal history of other malignant neoplasm of bronchus and lung: Secondary | ICD-10-CM | POA: Diagnosis not present

## 2018-11-07 DIAGNOSIS — E1142 Type 2 diabetes mellitus with diabetic polyneuropathy: Secondary | ICD-10-CM | POA: Diagnosis not present

## 2018-11-07 DIAGNOSIS — K219 Gastro-esophageal reflux disease without esophagitis: Secondary | ICD-10-CM | POA: Diagnosis not present

## 2018-11-07 DIAGNOSIS — E039 Hypothyroidism, unspecified: Secondary | ICD-10-CM | POA: Diagnosis not present

## 2018-11-07 DIAGNOSIS — I69354 Hemiplegia and hemiparesis following cerebral infarction affecting left non-dominant side: Secondary | ICD-10-CM | POA: Diagnosis not present

## 2018-11-07 DIAGNOSIS — Z87891 Personal history of nicotine dependence: Secondary | ICD-10-CM | POA: Diagnosis not present

## 2018-11-07 DIAGNOSIS — H3530 Unspecified macular degeneration: Secondary | ICD-10-CM | POA: Diagnosis not present

## 2018-11-07 DIAGNOSIS — I10 Essential (primary) hypertension: Secondary | ICD-10-CM | POA: Diagnosis not present

## 2018-11-07 DIAGNOSIS — Z9981 Dependence on supplemental oxygen: Secondary | ICD-10-CM | POA: Diagnosis not present

## 2018-11-09 DIAGNOSIS — I6529 Occlusion and stenosis of unspecified carotid artery: Secondary | ICD-10-CM | POA: Diagnosis not present

## 2018-11-09 DIAGNOSIS — Z7984 Long term (current) use of oral hypoglycemic drugs: Secondary | ICD-10-CM | POA: Diagnosis not present

## 2018-11-09 DIAGNOSIS — J449 Chronic obstructive pulmonary disease, unspecified: Secondary | ICD-10-CM | POA: Diagnosis not present

## 2018-11-09 DIAGNOSIS — E43 Unspecified severe protein-calorie malnutrition: Secondary | ICD-10-CM | POA: Diagnosis not present

## 2018-11-09 DIAGNOSIS — Z9181 History of falling: Secondary | ICD-10-CM | POA: Diagnosis not present

## 2018-11-09 DIAGNOSIS — M4722 Other spondylosis with radiculopathy, cervical region: Secondary | ICD-10-CM | POA: Diagnosis not present

## 2018-11-09 DIAGNOSIS — I10 Essential (primary) hypertension: Secondary | ICD-10-CM | POA: Diagnosis not present

## 2018-11-09 DIAGNOSIS — H3530 Unspecified macular degeneration: Secondary | ICD-10-CM | POA: Diagnosis not present

## 2018-11-09 DIAGNOSIS — E1142 Type 2 diabetes mellitus with diabetic polyneuropathy: Secondary | ICD-10-CM | POA: Diagnosis not present

## 2018-11-09 DIAGNOSIS — E039 Hypothyroidism, unspecified: Secondary | ICD-10-CM | POA: Diagnosis not present

## 2018-11-09 DIAGNOSIS — Z9981 Dependence on supplemental oxygen: Secondary | ICD-10-CM | POA: Diagnosis not present

## 2018-11-09 DIAGNOSIS — I2510 Atherosclerotic heart disease of native coronary artery without angina pectoris: Secondary | ICD-10-CM | POA: Diagnosis not present

## 2018-11-09 DIAGNOSIS — I69354 Hemiplegia and hemiparesis following cerebral infarction affecting left non-dominant side: Secondary | ICD-10-CM | POA: Diagnosis not present

## 2018-11-09 DIAGNOSIS — J441 Chronic obstructive pulmonary disease with (acute) exacerbation: Secondary | ICD-10-CM | POA: Diagnosis not present

## 2018-11-09 DIAGNOSIS — M069 Rheumatoid arthritis, unspecified: Secondary | ICD-10-CM | POA: Diagnosis not present

## 2018-11-09 DIAGNOSIS — K219 Gastro-esophageal reflux disease without esophagitis: Secondary | ICD-10-CM | POA: Diagnosis not present

## 2018-11-09 DIAGNOSIS — Z87891 Personal history of nicotine dependence: Secondary | ICD-10-CM | POA: Diagnosis not present

## 2018-11-09 DIAGNOSIS — Z85118 Personal history of other malignant neoplasm of bronchus and lung: Secondary | ICD-10-CM | POA: Diagnosis not present

## 2018-11-09 DIAGNOSIS — E785 Hyperlipidemia, unspecified: Secondary | ICD-10-CM | POA: Diagnosis not present

## 2018-11-10 ENCOUNTER — Other Ambulatory Visit: Payer: Self-pay

## 2018-11-10 ENCOUNTER — Encounter: Payer: Self-pay | Admitting: Family Medicine

## 2018-11-10 ENCOUNTER — Ambulatory Visit (INDEPENDENT_AMBULATORY_CARE_PROVIDER_SITE_OTHER): Payer: Medicare Other | Admitting: Family Medicine

## 2018-11-10 VITALS — BP 110/60 | HR 70 | Temp 98.1°F | Resp 16 | Ht 64.0 in | Wt 145.2 lb

## 2018-11-10 DIAGNOSIS — J432 Centrilobular emphysema: Secondary | ICD-10-CM | POA: Diagnosis not present

## 2018-11-10 DIAGNOSIS — I25118 Atherosclerotic heart disease of native coronary artery with other forms of angina pectoris: Secondary | ICD-10-CM

## 2018-11-10 DIAGNOSIS — Z0000 Encounter for general adult medical examination without abnormal findings: Secondary | ICD-10-CM | POA: Diagnosis not present

## 2018-11-10 DIAGNOSIS — E039 Hypothyroidism, unspecified: Secondary | ICD-10-CM

## 2018-11-10 DIAGNOSIS — I10 Essential (primary) hypertension: Secondary | ICD-10-CM | POA: Diagnosis not present

## 2018-11-10 DIAGNOSIS — C3411 Malignant neoplasm of upper lobe, right bronchus or lung: Secondary | ICD-10-CM

## 2018-11-10 NOTE — Progress Notes (Signed)
Patient: [name] [name], Female    DOB: July 13, 1940, 78 y.o.   MRN: [id number] Visit Date: 11/10/2018  Today's Provider: [name], MD   No chief complaint on file.  Subjective:     Annual physical visit [name] [name] is a 78 y.o. female. She feels fairly well, patient reports pain/difficuly when walking, patient reports that she has pain in her lower legs and has to use a walker for mobility. She reports she is not exercising . She reports she is sleeping well.  -----------------------------------------------------------   Review of Systems  Constitutional: Negative.   HENT: Negative.   Eyes: Positive for redness.  Respiratory: Positive for chest tightness and shortness of breath.   Cardiovascular: Positive for chest pain.  Gastrointestinal: Negative.   Endocrine: Negative.   Genitourinary: Negative.   Musculoskeletal: Positive for arthralgias, back pain and myalgias.  Allergic/Immunologic: Negative.   Neurological: Negative.   Hematological: Negative.   Psychiatric/Behavioral: Negative.     Social History   Socioeconomic History  . Marital status: Married    Spouse name: Not on file  . Number of children: 4  . Years of education: Not on file  . Highest education level: 11th grade  Occupational History  . Occupation: Retired, Licensed conveyancer: RETIRED  Social Needs  . Financial resource strain: Somewhat hard  . Food insecurity    Worry: Never true    Inability: Never true  . Transportation needs    Medical: No    Non-medical: No  Tobacco Use  . Smoking status: Former Smoker    Packs/day: 1.00    Years: 50.00    Pack years: 50.00    Types: Cigarettes    Quit date: 10/03/2015    Years since quitting: 3.1  . Smokeless tobacco: Never Used  . Tobacco comment: smokes 3 cigs daily 05/06/15. Pt instructed to quit.  Substance and Sexual Activity  . Alcohol use: No    Alcohol/week: 0.0 standard drinks  . Drug use: No  . Sexual  activity: Never  Lifestyle  . Physical activity    Days per week: 0 days    Minutes per session: 0 min  . Stress: Not at all  Relationships  . Social Herbalist on phone: Patient refused    Gets together: Patient refused    Attends religious service: Patient refused    Active member of club or organization: Patient refused    Attends meetings of clubs or organizations: Patient refused    Relationship status: Patient refused  . Intimate partner violence    Fear of current or ex partner: Patient refused    Emotionally abused: Patient refused    Physically abused: Patient refused    Forced sexual activity: Patient refused  Other Topics Concern  . Not on file  Social History Narrative   Married with 4 children   Gets regular exercise   Lives at home with her husband.  Ambulates with a cane.    Past Medical History:  Diagnosis Date  . Abnormal CT lung screening 10/17/2015  . COPD (chronic obstructive pulmonary disease) ([place])   . Coronary artery disease, non-occlusive    a. cath 2006: min nonobs CAD; b. cath 12/2010: cath LAD 50%, RCA 60%; c. 08/2013: Minimal luminal irregs, right dominant system with no significant CAD, diffuse luminal irregs noted. Normal EF 55%, no AS or MS.   . Diabetes mellitus   . Hyperlipemia  Followed by Dr. Rosanna Randy  . Hypertension   . Lung cancer ([address])   . Macular degeneration    rt  . Personal history of tobacco use, presenting hazards to health 10/15/2015  . Pneumonia    hx  . Shortness of breath   . Stroke [facility])      Patient Active Problem List   Diagnosis Date Noted  . Chest pain 09/23/2018  . Stroke (cerebrum) ([place]) 05/06/2018  . Malnutrition of moderate degree 03/31/2017  . Acute respiratory failure with hypoxia ([place]) 03/30/2017  . AKI (acute kidney injury) ([place]) 07/15/2016  . Protein-calorie malnutrition, severe 02/08/2016  . Primary cancer of right upper lobe of lung ([place]) 11/20/2015  . Abnormal CT lung screening 10/17/2015   . Personal history of tobacco use, presenting hazards to health 10/15/2015  . Chronic vulvitis 09/26/2014  . Allergic reaction 09/26/2014  . Carotid stenosis 08/30/2014  . Cervical nerve root disorder 08/10/2014  . CAD in native artery 08/10/2014  . B12 deficiency 08/10/2014  . Back ache 08/10/2014  . Bronchitis, chronic ([place]) 08/10/2014  . Diabetes mellitus with polyneuropathy ([place]) 08/10/2014  . Can't get food down 08/10/2014  . Eczema of external ear 08/10/2014  . Accumulation of fluid in tissues 08/10/2014  . Gout 08/10/2014  . Adult hypothyroidism 08/10/2014  . Mononeuritis 08/10/2014  . Muscle ache 08/10/2014  . Disorder of peripheral nervous system 08/10/2014  . Lesion of vulva 08/10/2014  . Cervical spondylosis with radiculopathy 10/19/2013  . COPD exacerbation ([place]) 03/29/2013  . CAD (coronary artery disease) 06/22/2011  . COPD (chronic obstructive pulmonary disease) with emphysema ([place]) 03/07/2010  . CHEST PAIN UNSPECIFIED 07/22/2009  . HLD (hyperlipidemia) 04/01/2009  . Malaise and fatigue 04/01/2009  . Hyperlipidemia 01/18/2009  . TOBACCO ABUSE 01/18/2009  . HYPERTENSION, BENIGN 01/18/2009  . CLAUDICATION 01/18/2009  . Pain in limb 01/18/2009  . CAFL (chronic airflow limitation) ([place]) 01/20/2007  . Late effects of cerebrovascular disease 01/10/2007  . Essential (primary) hypertension 12/22/2006    Past Surgical History:  Procedure Laterality Date  . ABDOMINAL HYSTERECTOMY    . ANTERIOR CERVICAL DECOMP/DISCECTOMY FUSION N/A 10/19/2013   Procedure: CERVICAL FIVE-SIX ANTERIOR CERVICAL DECOMPRESSION WITH FUSION INTERBODY PROSTHESIS PLATING AND PEEK CAGE;  Surgeon: Ophelia Charter, MD;  Location: [facility];  Service: Neurosurgery;  Laterality: N/A;  . BACK SURGERY  80's  . BREAST CYST EXCISION Left    left negative   . CARDIAC CATHETERIZATION  05/2004  . CATARACT EXTRACTION Left   . CHOLECYSTECTOMY    . ENDARTERECTOMY Left 10/24/2014   Procedure:  ENDARTERECTOMY CAROTID;  Surgeon: Algernon Huxley, MD;  Location: [facility] ORS;  Service: Vascular;  Laterality: Left;  . ENDOBRONCHIAL ULTRASOUND N/A 11/14/2015   Procedure: ENDOBRONCHIAL ULTRASOUND;  Surgeon: [name], MD;  Location: [facility] ORS;  Service: Cardiopulmonary;  Laterality: N/A;  . PERIPHERAL VASCULAR CATHETERIZATION N/A 12/04/2015   Procedure: Glori Luis Cath Insertion;  Surgeon: Algernon Huxley, MD;  Location: [place] CV LAB;  Service: Cardiovascular;  Laterality: N/A;  . PORTA CATH REMOVAL N/A 05/17/2017   Procedure: PORTA CATH REMOVAL;  Surgeon: Algernon Huxley, MD;  Location: [place] CV LAB;  Service: Cardiovascular;  Laterality: N/A;  . TONSILLECTOMY AND ADENOIDECTOMY    . VESICOVAGINAL FISTULA CLOSURE W/ TAH      Her family history includes Heart attack in her brother, brother, father, and paternal grandmother; Lung cancer in her maternal grandfather; Stroke in her mother.   Current Outpatient Medications:  .  albuterol (VENTOLIN HFA)  108 (90 Base) MCG/ACT inhaler, Inhale 2 puffs into the lungs every 4 (four) hours as needed for wheezing or shortness of breath., Disp: 8 g, Rfl: 11 .  aspirin EC 81 MG EC tablet, Take 1 tablet (81 mg total) by mouth daily., Disp: 30 tablet, Rfl: 0 .  atorvastatin (LIPITOR) 40 MG tablet, Take 1 tablet (40 mg total) by mouth daily at 6 PM., Disp: 90 tablet, Rfl: 3 .  budesonide-formoterol (SYMBICORT) 80-4.5 MCG/ACT inhaler, Inhale 2 puffs into the lungs 2 (two) times daily., Disp: 1 Inhaler, Rfl: 2 .  clopidogrel (PLAVIX) 75 MG tablet, TAKE 1 TABLET BY MOUTH EVERY DAY, Disp: 90 tablet, Rfl: 1 .  fluticasone (FLONASE) 50 MCG/ACT nasal spray, Place 2 sprays into both nostrils daily., Disp: 16 g, Rfl: 6 .  gabapentin (NEURONTIN) 600 MG tablet, Take 0.5 tablets (300 mg total) by mouth 2 (two) times daily., Disp: 180 tablet, Rfl: 3 .  glimepiride (AMARYL) 2 MG tablet, Take 1 tablet (2 mg total) by mouth 2 (two) times daily., Disp: 180 tablet, Rfl: 3 .   isosorbide mononitrate (IMDUR) 30 MG 24 hr tablet, TAKE 1 TABLET BY MOUTH  DAILY, Disp: 90 tablet, Rfl: 3 .  LORazepam (ATIVAN) 1 MG tablet, Take 1 tablet (1 mg total) by mouth 2 (two) times daily as needed for anxiety., Disp: 30 tablet, Rfl: 5 .  magnesium oxide (MAG-OX) 400 (241.3 MG) MG tablet, Take 400 mg by mouth 2 (two) times daily. , Disp: , Rfl:  .  metFORMIN (GLUCOPHAGE) 500 MG tablet, Take 1 tablet (500 mg total) by mouth 2 (two) times daily with a meal., Disp: 180 tablet, Rfl: 3 .  metoprolol tartrate (LOPRESSOR) 25 MG tablet, Take 1 tablet (25 mg total) by mouth 2 (two) times daily., Disp: 180 tablet, Rfl: 3 .  mometasone (ELOCON) 0.1 % lotion, PLACE 4 DROPS INTO EACH EAR CANAL AT BEDTIME AS NEEDED FOR DRY SKIN/ITCHING/DISCOMFORT., Disp: , Rfl:  .  Multiple Vitamins-Minerals (ICAPS) CAPS, Take 1 capsule by mouth 2 (two) times daily. , Disp: , Rfl:  .  nitroGLYCERIN (NITROSTAT) 0.4 MG SL tablet, Place 1 tablet (0.4 mg total) under the tongue every 5 (five) minutes as needed for chest pain., Disp: 25 tablet, Rfl: 3 .  ONETOUCH ULTRA test strip, USE TO CHECK BLOOD SUGAR ONCE DAILY, Disp: 50 strip, Rfl: 23 .  pantoprazole (PROTONIX) 40 MG tablet, Take 1 tablet (40 mg total) by mouth daily., Disp: 90 tablet, Rfl: 3 No current facility-administered medications for this visit.   Facility-Administered Medications Ordered in Other Visits:  .  ondansetron (ZOFRAN) 8 mg, dexamethasone (DECADRON) 10 mg in sodium chloride 0.9 % 50 mL IVPB, , Intravenous, Once, [name], Kathlene November, MD  Patient Care Team: Jerrol Banana., MD as PCP - General (Unknown Physician Specialty) Rockey Situ, Kathlene November, MD as Consulting Physician (Cardiology) Dew, Erskine Squibb, MD as Consulting Physician (Vascular Surgery) Pa, [place] as Consulting Physician (Optometry) [name], Kathlene November, MD as Consulting Physician (Oncology) [name], MD as Referring Physician (Radiation Oncology) Murlean Iba, MD as  Consulting Physician (Internal Medicine) Cathi Roan, [facility] (Pharmacist)    Objective:    Vitals: There were no vitals taken for this visit.  Physical Exam Vitals signs reviewed.  Constitutional:      Appearance: She is well-developed.     Comments: Patient sitting in wheelchair due to chronic weakness.  HENT:     Head: Normocephalic and atraumatic.     Right Ear: External  ear normal.     Left Ear: External ear normal.     Nose: Nose normal.  Eyes:     General: No scleral icterus.    Conjunctiva/sclera: Conjunctivae normal.  Neck:     Thyroid: No thyromegaly.  Cardiovascular:     Rate and Rhythm: Normal rate and regular rhythm.     Heart sounds: Normal heart sounds.  Pulmonary:     Effort: Pulmonary effort is normal.     Breath sounds: Normal breath sounds.  Abdominal:     Palpations: Abdomen is soft.  Musculoskeletal:     Right lower leg: No edema.     Left lower leg: No edema.  Skin:    General: Skin is warm and dry.  Neurological:     Mental Status: She is alert and oriented to person, place, and time. Mental status is at baseline.  Psychiatric:        Mood and Affect: Mood normal.        Behavior: Behavior normal.        Thought Content: Thought content normal.        Judgment: Judgment normal.     Activities of Daily Living In your present state of health, do you have any difficulty performing the following activities: 09/23/2018 06/07/2018  Hearing? N Y  Comment - Just ordered bilateral hearing aids.   Vision? N Y  Comment - Due to blindness in right eye. Wears eye glasses daily.   Difficulty concentrating or making decisions? N Y  Walking or climbing stairs? Y Y  Comment - Due to SOB an leg pains.   Dressing or bathing? N N  Doing errands, shopping? N Y  Comment - Does not drive.   Preparing Food and eating ? - Y  Comment - Does minimal cooking.  Using the Toilet? - N  In the past six months, have you accidently leaked urine? - N  Do you have  problems with loss of bowel control? - N  Managing your Medications? - Y  Comment - Husband manages medications.   Managing your Finances? - Y  Comment - Husband manages finances.  Housekeeping or managing your Housekeeping? - Y  Comment - Stepdaughter does cleaning.   Some recent data might be hidden    Fall Risk Assessment Fall Risk  06/07/2018 06/09/2017 05/31/2017 12/29/2016 07/22/2016  Falls in the past year? 1 Yes Yes Yes Yes  Number falls in past yr: 1 2 or more 2 or more 2 or more 1  Injury with Fall? 0 - No No Yes  Comment - - - - shouklder pain/no fracture  Risk Factor Category  - High Fall Risk High Fall Risk - -  Risk for fall due to : Impaired mobility;Impaired balance/gait - Impaired balance/gait - Impaired mobility  Risk for fall due to: Comment - - - - uses walker  Follow up Falls prevention discussed - Falls prevention discussed Falls evaluation completed;Falls prevention discussed Education provided     Depression Screen PHQ 2/9 Scores 06/07/2018 06/07/2018 05/31/2017 07/22/2016  PHQ - 2 Score 0 0 0 0  PHQ- 9 Score 4 - - -    No flowsheet data found.    Assessment & Plan:     Annual Wellness Visit  Reviewed patient's Family Medical History Reviewed and updated list of patient's medical providers Assessment of cognitive impairment was done Assessed patient's functional ability Established a written schedule for health screening [place] Completed and Reviewed  Exercise Activities  and Dietary recommendations Goals    . DIET - INCREASE WATER INTAKE     Recommend to drink at least 6-8 8oz glasses of water per day.    . I want to stay well (pt-stated)     Current Barriers:  . High pill burden  Pharmacist Clinical Goal(s):  Marland Kitchen Over the next 0- days, patient will demonstrate improved understanding of prescribed medications and rationale for usage as evidenced by adherence to medications by patient report and fill history  Interventions: .  Comprehensive medication review performed. . Discussed plans with patient for ongoing care management follow up and provided patient with direct contact information for care management team  Patient Self Care Activities:  . Self administers medications as prescribed . Attends all scheduled provider appointments . Calls provider office for new concerns or questions  Initial goal documentation       . Prevent falls     Recommend several different ways to avoid falls and how to prep home to prevent falls.       Immunization History  Administered Date(s) Administered  . Influenza Split 01/17/2010, 01/05/2012, 02/29/2012  . Influenza Whole 12/05/2009, 12/10/2010  . Influenza, High Dose Seasonal PF 02/14/2014, 01/01/2015, 12/29/2016, 01/25/2018  . Influenza,inj,Quad PF,6+ Mos 01/04/2013, 02/27/2013, 03/19/2016  . Influenza-Unspecified 02/04/2014  . Pneumococcal Conjugate-13 02/29/2012  . Pneumococcal Polysaccharide-23 01/05/2008, 01/05/2012  . Tdap 01/05/2012    Health Maintenance  Topic Date Due  . URINE MICROALBUMIN  12/29/2017  . FOOT EXAM  05/24/2018  . OPHTHALMOLOGY EXAM  07/07/2018  . INFLUENZA VACCINE  11/05/2018  . HEMOGLOBIN A1C  03/07/2019  . TETANUS/TDAP  01/04/2022  . DEXA SCAN  01/20/2022  . PNA vac Low Risk Adult  Completed     Discussed health benefits of physical activity, and encouraged her to engage in regular exercise appropriate for her age and condition.  1. Annual physical exam Pt declines breast/pelvic/DRE. Pt declines BMD. 2. Essential (primary) hypertension Decrease metoprolol to 12.5 mg bid  3. Coronary artery disease of native artery of native heart with stable angina pectoris ([place])   4. Primary cancer of right upper lobe of lung ([place])   5. Centrilobular emphysema ([place])   6. Adult hypothyroidism    I have done the exam and reviewed the chart and it is accurate to the best of my knowledge. Development worker, community has been used and  any  errors in dictation or transcription are unintentional. Miguel Aschoff M.D. [facility]  ------------------------------------------------------------------------------------------------------------    [name], MD  [facility] Clint Bolder as a scribe for [name], MD.,have documented all relevant documentation on the behalf of [name], MD,as directed by  [name], MD while in the presence of [name], MD.

## 2018-11-10 NOTE — Patient Instructions (Signed)
Decrease Metoprolol to 1/2 tablet twice a day.

## 2018-11-14 DIAGNOSIS — J449 Chronic obstructive pulmonary disease, unspecified: Secondary | ICD-10-CM | POA: Diagnosis not present

## 2018-11-14 DIAGNOSIS — Z9181 History of falling: Secondary | ICD-10-CM | POA: Diagnosis not present

## 2018-11-14 DIAGNOSIS — M069 Rheumatoid arthritis, unspecified: Secondary | ICD-10-CM | POA: Diagnosis not present

## 2018-11-14 DIAGNOSIS — E1142 Type 2 diabetes mellitus with diabetic polyneuropathy: Secondary | ICD-10-CM | POA: Diagnosis not present

## 2018-11-14 DIAGNOSIS — E785 Hyperlipidemia, unspecified: Secondary | ICD-10-CM | POA: Diagnosis not present

## 2018-11-14 DIAGNOSIS — Z85118 Personal history of other malignant neoplasm of bronchus and lung: Secondary | ICD-10-CM | POA: Diagnosis not present

## 2018-11-14 DIAGNOSIS — H3530 Unspecified macular degeneration: Secondary | ICD-10-CM | POA: Diagnosis not present

## 2018-11-14 DIAGNOSIS — I10 Essential (primary) hypertension: Secondary | ICD-10-CM | POA: Diagnosis not present

## 2018-11-14 DIAGNOSIS — I6529 Occlusion and stenosis of unspecified carotid artery: Secondary | ICD-10-CM | POA: Diagnosis not present

## 2018-11-14 DIAGNOSIS — Z9981 Dependence on supplemental oxygen: Secondary | ICD-10-CM | POA: Diagnosis not present

## 2018-11-14 DIAGNOSIS — J441 Chronic obstructive pulmonary disease with (acute) exacerbation: Secondary | ICD-10-CM | POA: Diagnosis not present

## 2018-11-14 DIAGNOSIS — Z87891 Personal history of nicotine dependence: Secondary | ICD-10-CM | POA: Diagnosis not present

## 2018-11-14 DIAGNOSIS — I2510 Atherosclerotic heart disease of native coronary artery without angina pectoris: Secondary | ICD-10-CM | POA: Diagnosis not present

## 2018-11-14 DIAGNOSIS — R0602 Shortness of breath: Secondary | ICD-10-CM | POA: Diagnosis not present

## 2018-11-14 DIAGNOSIS — Z7984 Long term (current) use of oral hypoglycemic drugs: Secondary | ICD-10-CM | POA: Diagnosis not present

## 2018-11-14 DIAGNOSIS — I509 Heart failure, unspecified: Secondary | ICD-10-CM | POA: Diagnosis not present

## 2018-11-14 DIAGNOSIS — E039 Hypothyroidism, unspecified: Secondary | ICD-10-CM | POA: Diagnosis not present

## 2018-11-14 DIAGNOSIS — K219 Gastro-esophageal reflux disease without esophagitis: Secondary | ICD-10-CM | POA: Diagnosis not present

## 2018-11-14 DIAGNOSIS — I69354 Hemiplegia and hemiparesis following cerebral infarction affecting left non-dominant side: Secondary | ICD-10-CM | POA: Diagnosis not present

## 2018-11-14 DIAGNOSIS — M4722 Other spondylosis with radiculopathy, cervical region: Secondary | ICD-10-CM | POA: Diagnosis not present

## 2018-11-14 DIAGNOSIS — E43 Unspecified severe protein-calorie malnutrition: Secondary | ICD-10-CM | POA: Diagnosis not present

## 2018-11-16 DIAGNOSIS — E43 Unspecified severe protein-calorie malnutrition: Secondary | ICD-10-CM | POA: Diagnosis not present

## 2018-11-16 DIAGNOSIS — Z9981 Dependence on supplemental oxygen: Secondary | ICD-10-CM | POA: Diagnosis not present

## 2018-11-16 DIAGNOSIS — J449 Chronic obstructive pulmonary disease, unspecified: Secondary | ICD-10-CM | POA: Diagnosis not present

## 2018-11-16 DIAGNOSIS — I10 Essential (primary) hypertension: Secondary | ICD-10-CM | POA: Diagnosis not present

## 2018-11-16 DIAGNOSIS — I2510 Atherosclerotic heart disease of native coronary artery without angina pectoris: Secondary | ICD-10-CM | POA: Diagnosis not present

## 2018-11-16 DIAGNOSIS — E785 Hyperlipidemia, unspecified: Secondary | ICD-10-CM | POA: Diagnosis not present

## 2018-11-16 DIAGNOSIS — E1142 Type 2 diabetes mellitus with diabetic polyneuropathy: Secondary | ICD-10-CM | POA: Diagnosis not present

## 2018-11-16 DIAGNOSIS — M069 Rheumatoid arthritis, unspecified: Secondary | ICD-10-CM | POA: Diagnosis not present

## 2018-11-16 DIAGNOSIS — Z7984 Long term (current) use of oral hypoglycemic drugs: Secondary | ICD-10-CM | POA: Diagnosis not present

## 2018-11-16 DIAGNOSIS — I69354 Hemiplegia and hemiparesis following cerebral infarction affecting left non-dominant side: Secondary | ICD-10-CM | POA: Diagnosis not present

## 2018-11-16 DIAGNOSIS — M4722 Other spondylosis with radiculopathy, cervical region: Secondary | ICD-10-CM | POA: Diagnosis not present

## 2018-11-16 DIAGNOSIS — Z85118 Personal history of other malignant neoplasm of bronchus and lung: Secondary | ICD-10-CM | POA: Diagnosis not present

## 2018-11-16 DIAGNOSIS — Z87891 Personal history of nicotine dependence: Secondary | ICD-10-CM | POA: Diagnosis not present

## 2018-11-16 DIAGNOSIS — E039 Hypothyroidism, unspecified: Secondary | ICD-10-CM | POA: Diagnosis not present

## 2018-11-16 DIAGNOSIS — Z9181 History of falling: Secondary | ICD-10-CM | POA: Diagnosis not present

## 2018-11-16 DIAGNOSIS — H3530 Unspecified macular degeneration: Secondary | ICD-10-CM | POA: Diagnosis not present

## 2018-11-16 DIAGNOSIS — I6529 Occlusion and stenosis of unspecified carotid artery: Secondary | ICD-10-CM | POA: Diagnosis not present

## 2018-11-16 DIAGNOSIS — J441 Chronic obstructive pulmonary disease with (acute) exacerbation: Secondary | ICD-10-CM | POA: Diagnosis not present

## 2018-11-16 DIAGNOSIS — K219 Gastro-esophageal reflux disease without esophagitis: Secondary | ICD-10-CM | POA: Diagnosis not present

## 2018-11-21 DIAGNOSIS — J449 Chronic obstructive pulmonary disease, unspecified: Secondary | ICD-10-CM | POA: Diagnosis not present

## 2018-11-21 DIAGNOSIS — E1142 Type 2 diabetes mellitus with diabetic polyneuropathy: Secondary | ICD-10-CM | POA: Diagnosis not present

## 2018-11-21 DIAGNOSIS — E43 Unspecified severe protein-calorie malnutrition: Secondary | ICD-10-CM | POA: Diagnosis not present

## 2018-11-21 DIAGNOSIS — H3530 Unspecified macular degeneration: Secondary | ICD-10-CM | POA: Diagnosis not present

## 2018-11-21 DIAGNOSIS — J441 Chronic obstructive pulmonary disease with (acute) exacerbation: Secondary | ICD-10-CM | POA: Diagnosis not present

## 2018-11-21 DIAGNOSIS — Z9981 Dependence on supplemental oxygen: Secondary | ICD-10-CM | POA: Diagnosis not present

## 2018-11-21 DIAGNOSIS — I69354 Hemiplegia and hemiparesis following cerebral infarction affecting left non-dominant side: Secondary | ICD-10-CM | POA: Diagnosis not present

## 2018-11-21 DIAGNOSIS — M069 Rheumatoid arthritis, unspecified: Secondary | ICD-10-CM | POA: Diagnosis not present

## 2018-11-21 DIAGNOSIS — Z87891 Personal history of nicotine dependence: Secondary | ICD-10-CM | POA: Diagnosis not present

## 2018-11-21 DIAGNOSIS — Z9181 History of falling: Secondary | ICD-10-CM | POA: Diagnosis not present

## 2018-11-21 DIAGNOSIS — Z7984 Long term (current) use of oral hypoglycemic drugs: Secondary | ICD-10-CM | POA: Diagnosis not present

## 2018-11-21 DIAGNOSIS — K219 Gastro-esophageal reflux disease without esophagitis: Secondary | ICD-10-CM | POA: Diagnosis not present

## 2018-11-21 DIAGNOSIS — I2510 Atherosclerotic heart disease of native coronary artery without angina pectoris: Secondary | ICD-10-CM | POA: Diagnosis not present

## 2018-11-21 DIAGNOSIS — E785 Hyperlipidemia, unspecified: Secondary | ICD-10-CM | POA: Diagnosis not present

## 2018-11-21 DIAGNOSIS — Z85118 Personal history of other malignant neoplasm of bronchus and lung: Secondary | ICD-10-CM | POA: Diagnosis not present

## 2018-11-21 DIAGNOSIS — I10 Essential (primary) hypertension: Secondary | ICD-10-CM | POA: Diagnosis not present

## 2018-11-21 DIAGNOSIS — I6529 Occlusion and stenosis of unspecified carotid artery: Secondary | ICD-10-CM | POA: Diagnosis not present

## 2018-11-21 DIAGNOSIS — M4722 Other spondylosis with radiculopathy, cervical region: Secondary | ICD-10-CM | POA: Diagnosis not present

## 2018-11-21 DIAGNOSIS — E039 Hypothyroidism, unspecified: Secondary | ICD-10-CM | POA: Diagnosis not present

## 2018-11-24 DIAGNOSIS — J449 Chronic obstructive pulmonary disease, unspecified: Secondary | ICD-10-CM | POA: Diagnosis not present

## 2018-11-24 DIAGNOSIS — R0602 Shortness of breath: Secondary | ICD-10-CM | POA: Diagnosis not present

## 2018-11-26 DIAGNOSIS — J449 Chronic obstructive pulmonary disease, unspecified: Secondary | ICD-10-CM | POA: Diagnosis not present

## 2018-11-28 DIAGNOSIS — E785 Hyperlipidemia, unspecified: Secondary | ICD-10-CM | POA: Diagnosis not present

## 2018-11-28 DIAGNOSIS — E039 Hypothyroidism, unspecified: Secondary | ICD-10-CM | POA: Diagnosis not present

## 2018-11-28 DIAGNOSIS — Z85118 Personal history of other malignant neoplasm of bronchus and lung: Secondary | ICD-10-CM | POA: Diagnosis not present

## 2018-11-28 DIAGNOSIS — Z9181 History of falling: Secondary | ICD-10-CM | POA: Diagnosis not present

## 2018-11-28 DIAGNOSIS — I69354 Hemiplegia and hemiparesis following cerebral infarction affecting left non-dominant side: Secondary | ICD-10-CM | POA: Diagnosis not present

## 2018-11-28 DIAGNOSIS — Z9981 Dependence on supplemental oxygen: Secondary | ICD-10-CM | POA: Diagnosis not present

## 2018-11-28 DIAGNOSIS — I6529 Occlusion and stenosis of unspecified carotid artery: Secondary | ICD-10-CM | POA: Diagnosis not present

## 2018-11-28 DIAGNOSIS — J441 Chronic obstructive pulmonary disease with (acute) exacerbation: Secondary | ICD-10-CM | POA: Diagnosis not present

## 2018-11-28 DIAGNOSIS — E1142 Type 2 diabetes mellitus with diabetic polyneuropathy: Secondary | ICD-10-CM | POA: Diagnosis not present

## 2018-11-28 DIAGNOSIS — Z87891 Personal history of nicotine dependence: Secondary | ICD-10-CM | POA: Diagnosis not present

## 2018-11-28 DIAGNOSIS — Z7984 Long term (current) use of oral hypoglycemic drugs: Secondary | ICD-10-CM | POA: Diagnosis not present

## 2018-11-28 DIAGNOSIS — I2510 Atherosclerotic heart disease of native coronary artery without angina pectoris: Secondary | ICD-10-CM | POA: Diagnosis not present

## 2018-11-28 DIAGNOSIS — J449 Chronic obstructive pulmonary disease, unspecified: Secondary | ICD-10-CM | POA: Diagnosis not present

## 2018-11-28 DIAGNOSIS — H3530 Unspecified macular degeneration: Secondary | ICD-10-CM | POA: Diagnosis not present

## 2018-11-28 DIAGNOSIS — M4722 Other spondylosis with radiculopathy, cervical region: Secondary | ICD-10-CM | POA: Diagnosis not present

## 2018-11-28 DIAGNOSIS — E43 Unspecified severe protein-calorie malnutrition: Secondary | ICD-10-CM | POA: Diagnosis not present

## 2018-11-28 DIAGNOSIS — M069 Rheumatoid arthritis, unspecified: Secondary | ICD-10-CM | POA: Diagnosis not present

## 2018-11-28 DIAGNOSIS — K219 Gastro-esophageal reflux disease without esophagitis: Secondary | ICD-10-CM | POA: Diagnosis not present

## 2018-11-28 DIAGNOSIS — I10 Essential (primary) hypertension: Secondary | ICD-10-CM | POA: Diagnosis not present

## 2018-12-09 ENCOUNTER — Other Ambulatory Visit: Payer: Self-pay | Admitting: Internal Medicine

## 2018-12-22 ENCOUNTER — Other Ambulatory Visit: Payer: Self-pay

## 2018-12-22 ENCOUNTER — Ambulatory Visit
Admission: RE | Admit: 2018-12-22 | Discharge: 2018-12-22 | Disposition: A | Discharge status: Home / Self Care | Payer: Medicare Other | Source: Ambulatory Visit | Attending: Oncology | Admitting: Oncology

## 2018-12-22 DIAGNOSIS — I10 Essential (primary) hypertension: Secondary | ICD-10-CM | POA: Diagnosis not present

## 2018-12-22 DIAGNOSIS — C3411 Malignant neoplasm of upper lobe, right bronchus or lung: Secondary | ICD-10-CM | POA: Diagnosis not present

## 2018-12-22 DIAGNOSIS — I509 Heart failure, unspecified: Secondary | ICD-10-CM | POA: Diagnosis not present

## 2018-12-22 DIAGNOSIS — C3431 Malignant neoplasm of lower lobe, right bronchus or lung: Secondary | ICD-10-CM | POA: Diagnosis not present

## 2018-12-22 DIAGNOSIS — I2510 Atherosclerotic heart disease of native coronary artery without angina pectoris: Secondary | ICD-10-CM | POA: Diagnosis not present

## 2018-12-22 DIAGNOSIS — R0602 Shortness of breath: Secondary | ICD-10-CM | POA: Diagnosis not present

## 2018-12-22 LAB — POCT I-STAT CREATININE: Creatinine, Ser: 1.4 mg/dL — ABNORMAL HIGH (ref 0.44–1.00)

## 2018-12-22 MED ORDER — IOHEXOL 300 MG/ML  SOLN
75.0000 mL | Freq: Once | INTRAMUSCULAR | Status: AC | PRN
  Administered 2018-12-22: 50 mL via INTRAVENOUS

## 2018-12-22 NOTE — Progress Notes (Signed)
[place]  [phone]) [phone] [phone]) [phone]  ID: [name] [name] OB: Jan 29, 1941  MR#: [id number]  SHF#:[id number]  Patient Care Team: Jerrol Banana., MD as PCP - General (Unknown Physician Specialty) [name], MD as Consulting Physician (Cardiology) [name], MD as Consulting Physician (Vascular Surgery) [place], [place] as Consulting Physician (Optometry) [name], Kathlene November, MD as Consulting Physician (Oncology) [name], MD as Referring Physician (Radiation Oncology) Murlean Iba, MD as Consulting Physician (Internal Medicine) Cathi Roan, [facility] (Pharmacist)  CHIEF COMPLAINT: Clinical stage IIIa small cell lung carcinoma of the right upper lobe lung.  INTERVAL HISTORY: Patient returns to clinic today for further evaluation and discussion of her imaging results.  She currently feels well and is at her baseline.  She has a persistent peripheral neuropathy, but no other neurologic complaints.  She continues to have chronic weakness and fatigue. She denies any recent fevers or illnesses.  She denies any chest pain, shortness of breath, cough, or hemoptysis. She has a good appetite and denies weight loss. She denies any nausea, vomiting, constipation, or diarrhea. She has no urinary complaints.  Patient offers no further specific complaints today.  REVIEW OF SYSTEMS:   Review of Systems  Constitutional: Positive for malaise/fatigue. Negative for fever and weight loss.  Respiratory: Negative.  Negative for cough, hemoptysis and shortness of breath.   Cardiovascular: Negative.  Negative for chest pain and leg swelling.  Gastrointestinal: Negative for abdominal pain, melena, nausea and vomiting.  Genitourinary: Negative.  Negative for dysuria.  Musculoskeletal: Negative.  Negative for falls.  Skin: Negative.  Negative for rash.  Neurological: Positive for dizziness, tingling, sensory change and weakness. Negative for focal  weakness and headaches.  Psychiatric/Behavioral: Negative.  The patient is not nervous/anxious.     As per HPI. Otherwise, a complete review of systems is negative.  PAST MEDICAL HISTORY: Past Medical History:  Diagnosis Date  . Abnormal CT lung screening 10/17/2015  . COPD (chronic obstructive pulmonary disease) ([place])   . Coronary artery disease, non-occlusive    a. cath 2006: min nonobs CAD; b. cath 12/2010: cath LAD 50%, RCA 60%; c. 08/2013: Minimal luminal irregs, right dominant system with no significant CAD, diffuse luminal irregs noted. Normal EF 55%, no AS or MS.   . Diabetes mellitus   . Hyperlipemia    Followed by Dr. Rosanna Randy  . Hypertension   . Lung cancer ([place])   . Macular degeneration    rt  . Personal history of tobacco use, presenting hazards to health 10/15/2015  . Pneumonia    hx  . Shortness of breath   . Stroke [facility])     PAST SURGICAL HISTORY: Past Surgical History:  Procedure Laterality Date  . ABDOMINAL HYSTERECTOMY    . ANTERIOR CERVICAL DECOMP/DISCECTOMY FUSION N/A 10/19/2013   Procedure: CERVICAL FIVE-SIX ANTERIOR CERVICAL DECOMPRESSION WITH FUSION INTERBODY PROSTHESIS PLATING AND PEEK CAGE;  Surgeon: Ophelia Charter, MD;  Location: [place] [facility];  Service: Neurosurgery;  Laterality: N/A;  . BACK SURGERY  80's  . BREAST CYST EXCISION Left    left negative   . CARDIAC CATHETERIZATION  05/2004  . CATARACT EXTRACTION Left   . CHOLECYSTECTOMY    . ENDARTERECTOMY Left 10/24/2014   Procedure: ENDARTERECTOMY CAROTID;  Surgeon: Algernon Huxley, MD;  Location: [facility] ORS;  Service: Vascular;  Laterality: Left;  . ENDOBRONCHIAL ULTRASOUND N/A 11/14/2015   Procedure: ENDOBRONCHIAL ULTRASOUND;  Surgeon: [name], MD;  Location: [facility] ORS;  Service: Cardiopulmonary;  Laterality: N/A;  . PERIPHERAL VASCULAR CATHETERIZATION N/A 12/04/2015   Procedure: Glori Luis Cath Insertion;  Surgeon: Algernon Huxley, MD;  Location: [place] [facility];  Service: Cardiovascular;  Laterality: N/A;   . PORTA CATH REMOVAL N/A 05/17/2017   Procedure: PORTA CATH REMOVAL;  Surgeon: Algernon Huxley, MD;  Location: [place] [facility];  Service: Cardiovascular;  Laterality: N/A;  . TONSILLECTOMY AND ADENOIDECTOMY    . VESICOVAGINAL FISTULA CLOSURE W/ TAH      FAMILY HISTORY: Family History  Problem Relation Age of Onset  . Stroke Mother        Massive  . Heart attack Father        Massive  . Heart attack Brother   . Heart attack Brother   . Lung cancer Maternal Grandfather   . Heart attack Paternal Grandmother        MI       ADVANCED DIRECTIVES (Y/N):  N   HEALTH MAINTENANCE: Social History   Tobacco Use  . Smoking status: Former Smoker    Packs/day: 1.00    Years: 50.00    Pack years: 50.00    Types: Cigarettes    Quit date: 10/03/2015    Years since quitting: 3.2  . Smokeless tobacco: Never Used  . Tobacco comment: smokes 3 cigs daily 05/06/15. Pt instructed to quit.  Substance Use Topics  . Alcohol use: No    Alcohol/week: 0.0 standard drinks  . Drug use: No     Colonoscopy:  PAP:  Bone density:  Lipid panel:  Allergies  Allergen Reactions  . Coconut Fatty Acids Swelling and Other (See Comments)    Throat swells    Current Outpatient Medications  Medication Sig Dispense Refill  . albuterol (VENTOLIN HFA) 108 (90 Base) MCG/ACT inhaler Inhale 2 puffs into the lungs every 4 (four) hours as needed for wheezing or shortness of breath. 8 g 11  . amLODipine (NORVASC) 5 MG tablet     . aspirin EC 81 MG EC tablet Take 1 tablet (81 mg total) by mouth daily. 30 tablet 0  . atorvastatin (LIPITOR) 40 MG tablet Take 1 tablet (40 mg total) by mouth daily at 6 PM. 90 tablet 3  . budesonide-formoterol (SYMBICORT) 80-4.5 MCG/ACT inhaler Inhale 2 puffs into the lungs 2 (two) times daily. 1 Inhaler 2  . clopidogrel (PLAVIX) 75 MG tablet TAKE 1 TABLET BY MOUTH EVERY DAY 90 tablet 1  . fluticasone (FLONASE) 50 MCG/ACT nasal spray Place 2 sprays into both nostrils daily. 16 g 6   . gabapentin (NEURONTIN) 600 MG tablet Take 0.5 tablets (300 mg total) by mouth 2 (two) times daily. 180 tablet 3  . glimepiride (AMARYL) 2 MG tablet Take 1 tablet (2 mg total) by mouth 2 (two) times daily. 180 tablet 3  . isosorbide mononitrate (IMDUR) 30 MG 24 hr tablet TAKE 1 TABLET BY MOUTH  DAILY 90 tablet 3  . LORazepam (ATIVAN) 1 MG tablet Take 1 tablet (1 mg total) by mouth 2 (two) times daily as needed for anxiety. 30 tablet 5  . magnesium oxide (MAG-OX) 400 (241.3 MG) MG tablet Take 400 mg by mouth 2 (two) times daily.     . metFORMIN (GLUCOPHAGE) 500 MG tablet Take 1 tablet (500 mg total) by mouth 2 (two) times daily with a meal. 180 tablet 3  . metoprolol tartrate (LOPRESSOR) 25 MG tablet Take 1 tablet (25 mg total) by mouth 2 (two) times daily. 180 tablet 3  . mometasone (ELOCON)  0.1 % lotion PLACE 4 DROPS INTO EACH EAR CANAL AT BEDTIME AS NEEDED FOR DRY SKIN/ITCHING/DISCOMFORT.    . Multiple Vitamins-Minerals (ICAPS) CAPS Take 1 capsule by mouth 2 (two) times daily.     . nitroGLYCERIN (NITROSTAT) 0.4 MG SL tablet Place 1 tablet (0.4 mg total) under the tongue every 5 (five) minutes as needed for chest pain. 25 tablet 3  . ONETOUCH ULTRA test strip USE TO CHECK BLOOD SUGAR ONCE DAILY 50 strip 23  . pantoprazole (PROTONIX) 40 MG tablet Take 1 tablet (40 mg total) by mouth daily. 90 tablet 3  . sucralfate (CARAFATE) 1 g tablet      No current facility-administered medications for this visit.    Facility-Administered Medications Ordered in Other Visits  Medication Dose Route Frequency Provider Last Rate Last Dose  . ondansetron (ZOFRAN) 8 mg, dexamethasone (DECADRON) 10 mg in sodium chloride 0.9 % 50 mL IVPB   Intravenous Once Lloyd Huger, MD        OBJECTIVE: Vitals:   12/26/18 1048  BP: 126/63  Pulse: 60  Resp: 16  Temp: 97.7 F (36.5 C)     There is no height or weight on file to calculate BMI.    ECOG FS:1 - Symptomatic but completely ambulatory  General:  Well-developed, well-nourished, no acute distress.  Sitting in a wheelchair. Eyes: Pink conjunctiva, anicteric sclera. HEENT: Normocephalic, moist mucous membranes. Lungs: Clear to auscultation bilaterally. Heart: Regular rate and rhythm. No rubs, murmurs, or gallops. Abdomen: Soft, nontender, nondistended. No organomegaly noted, normoactive bowel sounds. Musculoskeletal: No edema, cyanosis, or clubbing. Neuro: Alert, answering all questions appropriately. Cranial nerves grossly intact. Skin: No rashes or petechiae noted. Psych: Normal affect.  LAB RESULTS:  Lab Results  Component Value Date   NA 136 09/22/2018   K 4.1 09/22/2018   CL 96 (L) 09/22/2018   CO2 26 09/22/2018   GLUCOSE 219 (H) 09/22/2018   BUN 28 (H) 09/22/2018   CREATININE 1.40 (H) 12/22/2018   CALCIUM 9.1 09/22/2018   PROT 7.4 09/22/2018   ALBUMIN 3.7 09/22/2018   AST 16 09/22/2018   ALT 19 09/22/2018   ALKPHOS 78 09/22/2018   BILITOT 0.4 09/22/2018   GFRNONAA 34 (L) 09/22/2018   GFRAA 39 (L) 09/22/2018    Lab Results  Component Value Date   WBC 7.0 09/22/2018   NEUTROABS 5.3 09/22/2018   HGB 10.4 (L) 09/22/2018   HCT 32.8 (L) 09/22/2018   MCV 97.6 09/22/2018   PLT 284 09/22/2018     STUDIES: Ct Chest W Contrast  Result Date: 12/22/2018 CLINICAL DATA:  Follow-up right lung cancer EXAM: CT CHEST WITH CONTRAST TECHNIQUE: Multidetector CT imaging of the chest was performed during intravenous contrast administration. CONTRAST:  77mL OMNIPAQUE IOHEXOL 300 MG/ML  SOLN COMPARISON:  06/17/2018, 12/16/2017, 09/07/2017 FINDINGS: Cardiovascular: Aortic atherosclerosis. Normal heart size. Scattered left coronary artery calcifications. No pericardial effusion. Mediastinum/Nodes: No enlarged mediastinal, hilar, or axillary lymph nodes. Thyroid gland, trachea, and esophagus demonstrate no significant findings. Lungs/Pleura: Moderate centrilobular emphysema. Unchanged post radiation appearance of a posterior superior  segment right lower lobe pulmonary nodule, with some evidence of surrounding fibrotic volume loss (series 3, image 64). Minimal post infectious or inflammatory consolidation of the medial right middle lobe and lingula, unchanged from prior (series 3, image 99). Occasional stable, small, benign pulmonary nodules, for example a 3 mm subpleural nodule of the apically right upper lobe (series 3, image 32). Mild, diffuse bilateral bronchial wall thickening. No pleural effusion or pneumothorax. Upper  Abdomen: No acute abnormality. Stable, benign bilateral adrenal nodules. Musculoskeletal: No chest wall mass or suspicious bone lesions identified. IMPRESSION: 1. Unchanged post radiation appearance of a posterior superior segment right lower lobe pulmonary nodule, with some evidence of surrounding fibrotic volume loss (series 3, image 64). 2. Occasional stable, small, benign pulmonary nodules, for example a 3 mm subpleural nodule of the apically right upper lobe (series 3, image 32). 3. Emphysema (ICD10-J43.9). Mild, diffuse bilateral bronchial wall thickening. 4.  Coronary artery disease.  Aortic Atherosclerosis (ICD10-I70.0). Electronically Signed   By: Eddie Candle M.D.   On: 12/22/2018 13:54    ASSESSMENT: Clinical stage IIIa small cell lung carcinoma of the right upper lobe lung.  PLAN:    1. Clinical stage IIIa small cell lung carcinoma of the right upper lobe lung: Patient completed cycle 3 of cisplatin and etoposide on October 24-26, 2017. Given patient's extensive hospitalization after cycle 3, treatment was discontinued.  Previously, MRI of the brain was negative and patient has completed PCI.  CT scan results from December 22, 2018 reviewed independently and reported as above with no obvious evidence of recurrent or progressive disease.  No intervention is needed at this time.  Return to clinic in 1 year with repeat imaging and further evaluation.   2.  Peripheral neuropathy: Chronic and unchanged.   3.   Anemia: Mild, monitor.  Patient's most recent hemoglobin on September 22, 2018 reported at 10.4.   Patient expressed understanding and was in agreement with this plan. She also understands that She can call clinic at any time with any questions, concerns, or complaints.   Cancer Staging Primary cancer of right upper lobe of lung [facility]) Staging form: Lung, AJCC 7th Edition - Clinical stage from 11/26/2015: Stage IIIA (T1a, N2, M0) - Signed by Lloyd Huger, MD on 11/26/2015   Lloyd Huger, MD   12/27/2018 6:37 AM

## 2018-12-25 DIAGNOSIS — J449 Chronic obstructive pulmonary disease, unspecified: Secondary | ICD-10-CM | POA: Diagnosis not present

## 2018-12-25 DIAGNOSIS — R0602 Shortness of breath: Secondary | ICD-10-CM | POA: Diagnosis not present

## 2018-12-26 ENCOUNTER — Other Ambulatory Visit: Payer: Self-pay

## 2018-12-26 ENCOUNTER — Inpatient Hospital Stay: Payer: Medicare Other | Attending: Oncology | Admitting: Oncology

## 2018-12-26 VITALS — BP 126/63 | HR 60 | Temp 97.7°F | Resp 16

## 2018-12-26 DIAGNOSIS — C3411 Malignant neoplasm of upper lobe, right bronchus or lung: Secondary | ICD-10-CM | POA: Diagnosis not present

## 2018-12-26 DIAGNOSIS — R55 Syncope and collapse: Secondary | ICD-10-CM | POA: Diagnosis not present

## 2018-12-26 DIAGNOSIS — Z03818 Encounter for observation for suspected exposure to other biological agents ruled out: Secondary | ICD-10-CM | POA: Diagnosis not present

## 2018-12-26 DIAGNOSIS — K625 Hemorrhage of anus and rectum: Secondary | ICD-10-CM | POA: Diagnosis not present

## 2018-12-26 DIAGNOSIS — R001 Bradycardia, unspecified: Secondary | ICD-10-CM | POA: Diagnosis not present

## 2018-12-26 DIAGNOSIS — D3502 Benign neoplasm of left adrenal gland: Secondary | ICD-10-CM | POA: Diagnosis not present

## 2018-12-26 DIAGNOSIS — K5730 Diverticulosis of large intestine without perforation or abscess without bleeding: Secondary | ICD-10-CM | POA: Diagnosis not present

## 2018-12-27 ENCOUNTER — Inpatient Hospital Stay
Admission: EM | Admit: 2018-12-27 | Discharge: 2018-12-30 | DRG: 378 | Disposition: A | Discharge status: Home / Self Care | Payer: Medicare Other | Attending: Internal Medicine | Admitting: Internal Medicine

## 2018-12-27 ENCOUNTER — Emergency Department: Payer: Medicare Other

## 2018-12-27 ENCOUNTER — Other Ambulatory Visit: Payer: Self-pay

## 2018-12-27 ENCOUNTER — Encounter: Payer: Self-pay | Admitting: Emergency Medicine

## 2018-12-27 ENCOUNTER — Telehealth: Payer: Self-pay

## 2018-12-27 DIAGNOSIS — Z9049 Acquired absence of other specified parts of digestive tract: Secondary | ICD-10-CM

## 2018-12-27 DIAGNOSIS — R001 Bradycardia, unspecified: Secondary | ICD-10-CM | POA: Diagnosis not present

## 2018-12-27 DIAGNOSIS — Z91018 Allergy to other foods: Secondary | ICD-10-CM

## 2018-12-27 DIAGNOSIS — F17210 Nicotine dependence, cigarettes, uncomplicated: Secondary | ICD-10-CM | POA: Diagnosis present

## 2018-12-27 DIAGNOSIS — H3530 Unspecified macular degeneration: Secondary | ICD-10-CM | POA: Diagnosis present

## 2018-12-27 DIAGNOSIS — Z66 Do not resuscitate: Secondary | ICD-10-CM | POA: Diagnosis present

## 2018-12-27 DIAGNOSIS — K5900 Constipation, unspecified: Secondary | ICD-10-CM | POA: Diagnosis not present

## 2018-12-27 DIAGNOSIS — I2510 Atherosclerotic heart disease of native coronary artery without angina pectoris: Secondary | ICD-10-CM | POA: Diagnosis not present

## 2018-12-27 DIAGNOSIS — K625 Hemorrhage of anus and rectum: Secondary | ICD-10-CM | POA: Diagnosis present

## 2018-12-27 DIAGNOSIS — K5730 Diverticulosis of large intestine without perforation or abscess without bleeding: Secondary | ICD-10-CM | POA: Diagnosis present

## 2018-12-27 DIAGNOSIS — Z981 Arthrodesis status: Secondary | ICD-10-CM

## 2018-12-27 DIAGNOSIS — Z8249 Family history of ischemic heart disease and other diseases of the circulatory system: Secondary | ICD-10-CM

## 2018-12-27 DIAGNOSIS — Z85118 Personal history of other malignant neoplasm of bronchus and lung: Secondary | ICD-10-CM

## 2018-12-27 DIAGNOSIS — E785 Hyperlipidemia, unspecified: Secondary | ICD-10-CM | POA: Diagnosis present

## 2018-12-27 DIAGNOSIS — R55 Syncope and collapse: Secondary | ICD-10-CM | POA: Diagnosis not present

## 2018-12-27 DIAGNOSIS — Z801 Family history of malignant neoplasm of trachea, bronchus and lung: Secondary | ICD-10-CM

## 2018-12-27 DIAGNOSIS — E1151 Type 2 diabetes mellitus with diabetic peripheral angiopathy without gangrene: Secondary | ICD-10-CM | POA: Diagnosis present

## 2018-12-27 DIAGNOSIS — Z79890 Hormone replacement therapy (postmenopausal): Secondary | ICD-10-CM

## 2018-12-27 DIAGNOSIS — Z79899 Other long term (current) drug therapy: Secondary | ICD-10-CM

## 2018-12-27 DIAGNOSIS — Z823 Family history of stroke: Secondary | ICD-10-CM

## 2018-12-27 DIAGNOSIS — Z8673 Personal history of transient ischemic attack (TIA), and cerebral infarction without residual deficits: Secondary | ICD-10-CM

## 2018-12-27 DIAGNOSIS — C3411 Malignant neoplasm of upper lobe, right bronchus or lung: Secondary | ICD-10-CM | POA: Diagnosis present

## 2018-12-27 DIAGNOSIS — Z90710 Acquired absence of both cervix and uterus: Secondary | ICD-10-CM

## 2018-12-27 DIAGNOSIS — I10 Essential (primary) hypertension: Secondary | ICD-10-CM | POA: Diagnosis present

## 2018-12-27 DIAGNOSIS — Z03818 Encounter for observation for suspected exposure to other biological agents ruled out: Secondary | ICD-10-CM | POA: Diagnosis not present

## 2018-12-27 DIAGNOSIS — K922 Gastrointestinal hemorrhage, unspecified: Secondary | ICD-10-CM | POA: Diagnosis present

## 2018-12-27 DIAGNOSIS — Z7902 Long term (current) use of antithrombotics/antiplatelets: Secondary | ICD-10-CM

## 2018-12-27 DIAGNOSIS — E039 Hypothyroidism, unspecified: Secondary | ICD-10-CM | POA: Diagnosis present

## 2018-12-27 DIAGNOSIS — D3502 Benign neoplasm of left adrenal gland: Secondary | ICD-10-CM | POA: Diagnosis not present

## 2018-12-27 DIAGNOSIS — K635 Polyp of colon: Secondary | ICD-10-CM | POA: Diagnosis present

## 2018-12-27 DIAGNOSIS — J449 Chronic obstructive pulmonary disease, unspecified: Secondary | ICD-10-CM | POA: Diagnosis not present

## 2018-12-27 DIAGNOSIS — Z23 Encounter for immunization: Secondary | ICD-10-CM

## 2018-12-27 DIAGNOSIS — Z20828 Contact with and (suspected) exposure to other viral communicable diseases: Secondary | ICD-10-CM | POA: Diagnosis present

## 2018-12-27 DIAGNOSIS — D62 Acute posthemorrhagic anemia: Secondary | ICD-10-CM | POA: Diagnosis present

## 2018-12-27 DIAGNOSIS — K640 First degree hemorrhoids: Secondary | ICD-10-CM | POA: Diagnosis present

## 2018-12-27 DIAGNOSIS — E1142 Type 2 diabetes mellitus with diabetic polyneuropathy: Secondary | ICD-10-CM | POA: Diagnosis present

## 2018-12-27 DIAGNOSIS — J439 Emphysema, unspecified: Secondary | ICD-10-CM | POA: Diagnosis present

## 2018-12-27 DIAGNOSIS — Z9221 Personal history of antineoplastic chemotherapy: Secondary | ICD-10-CM

## 2018-12-27 DIAGNOSIS — Z7901 Long term (current) use of anticoagulants: Secondary | ICD-10-CM

## 2018-12-27 LAB — URINALYSIS, COMPLETE (UACMP) WITH MICROSCOPIC
Bilirubin Urine: NEGATIVE
Glucose, UA: NEGATIVE mg/dL
Ketones, ur: NEGATIVE mg/dL
Leukocytes,Ua: NEGATIVE
Nitrite: NEGATIVE
Protein, ur: 100 mg/dL — AB
RBC / HPF: >50 RBC/hpf — ABNORMAL HIGH (ref 0–5)
Specific Gravity, Urine: 1.035 — ABNORMAL HIGH (ref 1.005–1.030)
WBC, UA: >50 WBC/hpf — ABNORMAL HIGH (ref 0–5)
pH: 7 (ref 5.0–8.0)

## 2018-12-27 LAB — GLUCOSE, CAPILLARY: Glucose-Capillary: 167 mg/dL — ABNORMAL HIGH (ref 70–99)

## 2018-12-27 LAB — CBC
HCT: 33.9 % — ABNORMAL LOW (ref 36.0–46.0)
Hemoglobin: 10.9 g/dL — ABNORMAL LOW (ref 12.0–15.0)
MCH: 30.4 pg (ref 26.0–34.0)
MCHC: 32.2 g/dL (ref 30.0–36.0)
MCV: 94.4 fL (ref 80.0–100.0)
Platelets: 251 10*3/uL (ref 150–400)
RBC: 3.59 MIL/uL — ABNORMAL LOW (ref 3.87–5.11)
RDW: 14.3 % (ref 11.5–15.5)
WBC: 9.4 10*3/uL (ref 4.0–10.5)
nRBC: 0 % (ref 0.0–0.2)

## 2018-12-27 LAB — COMPREHENSIVE METABOLIC PANEL
ALT: 19 U/L (ref 0–44)
AST: 19 U/L (ref 15–41)
Albumin: 3.9 g/dL (ref 3.5–5.0)
Alkaline Phosphatase: 62 U/L (ref 38–126)
Anion gap: 13 (ref 5–15)
BUN: 23 mg/dL (ref 8–23)
CO2: 26 mmol/L (ref 22–32)
Calcium: 9.3 mg/dL (ref 8.9–10.3)
Chloride: 99 mmol/L (ref 98–111)
Creatinine, Ser: 1.51 mg/dL — ABNORMAL HIGH (ref 0.44–1.00)
GFR calc Af Amer: 39 mL/min — ABNORMAL LOW (ref >60)
GFR calc non Af Amer: 33 mL/min — ABNORMAL LOW (ref >60)
Glucose, Bld: 181 mg/dL — ABNORMAL HIGH (ref 70–99)
Potassium: 4.7 mmol/L (ref 3.5–5.1)
Sodium: 138 mmol/L (ref 135–145)
Total Bilirubin: 0.6 mg/dL (ref 0.3–1.2)
Total Protein: 7 g/dL (ref 6.5–8.1)

## 2018-12-27 LAB — TROPONIN I (HIGH SENSITIVITY): Troponin I (High Sensitivity): 5 ng/L (ref <18)

## 2018-12-27 LAB — APTT: aPTT: 28 seconds (ref 24–36)

## 2018-12-27 LAB — SARS CORONAVIRUS 2 BY RT PCR (HOSPITAL ORDER, PERFORMED IN ~~LOC~~ HOSPITAL LAB): SARS Coronavirus 2: NEGATIVE

## 2018-12-27 LAB — LACTIC ACID, PLASMA: Lactic Acid, Venous: 2.8 mmol/L (ref 0.5–1.9)

## 2018-12-27 LAB — PROTIME-INR
INR: 0.9 (ref 0.8–1.2)
Prothrombin Time: 12.3 seconds (ref 11.4–15.2)

## 2018-12-27 MED ORDER — SODIUM CHLORIDE 0.9 % IV SOLN
80.0000 mg | Freq: Once | INTRAVENOUS | Status: AC
  Administered 2018-12-27: 80 mg via INTRAVENOUS
  Filled 2018-12-27: qty 80

## 2018-12-27 MED ORDER — SODIUM CHLORIDE 0.9 % IV BOLUS
500.0000 mL | Freq: Once | INTRAVENOUS | Status: AC
  Administered 2018-12-27: 500 mL via INTRAVENOUS

## 2018-12-27 MED ORDER — IOHEXOL 300 MG/ML  SOLN: 75.0000 mL | Freq: Once | INTRAMUSCULAR | Status: DC | PRN

## 2018-12-27 MED ORDER — IOHEXOL 350 MG/ML SOLN
75.0000 mL | Freq: Once | INTRAVENOUS | Status: AC | PRN
  Administered 2018-12-27: 75 mL via INTRAVENOUS

## 2018-12-27 NOTE — ED Triage Notes (Signed)
Pt states today she had a pain in her lower abd, went to the bathroom and passed bright red blood rectally. States this hasn't happened before. Denies dizziness, c/o weakness at this time, pt is pale. NAD.

## 2018-12-27 NOTE — ED Provider Notes (Signed)
[facility] Emergency Department Provider Note  ____________________________________________   First MD Initiated Contact with Patient 12/27/18 1800     (approximate)  I have reviewed the triage vital signs and the nursing notes.   HISTORY  Chief Complaint Rectal Bleeding    HPI Zanetta D Rojek is a 78 y.o. female with COPD, lung cancer on Plavix who presents for rectal bleeding.  Patient had rectal bleeding that started today.Denies prior history of this.  Denies prior colonoscopy.  Episodes have been intermittent, severe, nothing makes it better, nothing makes it worse.  While leaving the bathroom she had an episode of LOC while in the chair.  Did not fall or hit her head.  She denies straining in the bathroom.  Afterwards her glucose was normal.  She does endorse some mild abdominal pain with this.  Denies any frequent NSAID use.           Past Medical History:  Diagnosis Date  . Abnormal CT lung screening 10/17/2015  . COPD (chronic obstructive pulmonary disease) ([place])   . Coronary artery disease, non-occlusive    a. cath 2006: min nonobs CAD; b. cath 12/2010: cath LAD 50%, RCA 60%; c. 08/2013: Minimal luminal irregs, right dominant system with no significant CAD, diffuse luminal irregs noted. Normal EF 55%, no AS or MS.   . Diabetes mellitus   . Hyperlipemia    Followed by Dr. Rosanna Randy  . Hypertension   . Lung cancer ([place])   . Macular degeneration    rt  . Personal history of tobacco use, presenting hazards to health 10/15/2015  . Pneumonia    hx  . Shortness of breath   . Stroke [facility])     Patient Active Problem List   Diagnosis Date Noted  . Chest pain 09/23/2018  . Stroke (cerebrum) ([place]) 05/06/2018  . Malnutrition of moderate degree 03/31/2017  . Acute respiratory failure with hypoxia ([place]) 03/30/2017  . AKI (acute kidney injury) ([place]) 07/15/2016  . Protein-calorie malnutrition, severe 02/08/2016  . Primary cancer of right upper lobe of  lung ([place]) 11/20/2015  . Abnormal CT lung screening 10/17/2015  . Personal history of tobacco use, presenting hazards to health 10/15/2015  . Chronic vulvitis 09/26/2014  . Allergic reaction 09/26/2014  . Carotid stenosis 08/30/2014  . Cervical nerve root disorder 08/10/2014  . CAD in native artery 08/10/2014  . B12 deficiency 08/10/2014  . Back ache 08/10/2014  . Bronchitis, chronic ([place]) 08/10/2014  . Diabetes mellitus with polyneuropathy ([place]) 08/10/2014  . Can't get food down 08/10/2014  . Eczema of external ear 08/10/2014  . Accumulation of fluid in tissues 08/10/2014  . Gout 08/10/2014  . Adult hypothyroidism 08/10/2014  . Mononeuritis 08/10/2014  . Muscle ache 08/10/2014  . Disorder of peripheral nervous system 08/10/2014  . Lesion of vulva 08/10/2014  . Cervical spondylosis with radiculopathy 10/19/2013  . COPD exacerbation ([place]) 03/29/2013  . CAD (coronary artery disease) 06/22/2011  . COPD (chronic obstructive pulmonary disease) with emphysema ([place]) 03/07/2010  . CHEST PAIN UNSPECIFIED 07/22/2009  . HLD (hyperlipidemia) 04/01/2009  . Malaise and fatigue 04/01/2009  . Hyperlipidemia 01/18/2009  . TOBACCO ABUSE 01/18/2009  . HYPERTENSION, BENIGN 01/18/2009  . CLAUDICATION 01/18/2009  . Pain in limb 01/18/2009  . CAFL (chronic airflow limitation) ([place]) 01/20/2007  . Late effects of cerebrovascular disease 01/10/2007  . Essential (primary) hypertension 12/22/2006    Past Surgical History:  Procedure Laterality Date  . ABDOMINAL HYSTERECTOMY    . ANTERIOR CERVICAL DECOMP/DISCECTOMY  FUSION N/A 10/19/2013   Procedure: CERVICAL FIVE-SIX ANTERIOR CERVICAL DECOMPRESSION WITH FUSION INTERBODY PROSTHESIS PLATING AND PEEK CAGE;  Surgeon: Ophelia Charter, MD;  Location: [place] [facility];  Service: Neurosurgery;  Laterality: N/A;  . BACK SURGERY  80's  . BREAST CYST EXCISION Left    left negative   . CARDIAC CATHETERIZATION  05/2004  . CATARACT EXTRACTION Left   .  CHOLECYSTECTOMY    . ENDARTERECTOMY Left 10/24/2014   Procedure: ENDARTERECTOMY CAROTID;  Surgeon: Algernon Huxley, MD;  Location: [facility] ORS;  Service: Vascular;  Laterality: Left;  . ENDOBRONCHIAL ULTRASOUND N/A 11/14/2015   Procedure: ENDOBRONCHIAL ULTRASOUND;  Surgeon: [name], MD;  Location: [facility] ORS;  Service: Cardiopulmonary;  Laterality: N/A;  . PERIPHERAL VASCULAR CATHETERIZATION N/A 12/04/2015   Procedure: Glori Luis Cath Insertion;  Surgeon: Algernon Huxley, MD;  Location: [place] CV LAB;  Service: Cardiovascular;  Laterality: N/A;  . PORTA CATH REMOVAL N/A 05/17/2017   Procedure: PORTA CATH REMOVAL;  Surgeon: Algernon Huxley, MD;  Location: [place] CV LAB;  Service: Cardiovascular;  Laterality: N/A;  . TONSILLECTOMY AND ADENOIDECTOMY    . VESICOVAGINAL FISTULA CLOSURE W/ TAH      Prior to Admission medications   Medication Sig Start Date End Date Taking? Authorizing Provider  albuterol (VENTOLIN HFA) 108 (90 Base) MCG/ACT inhaler Inhale 2 puffs into the lungs every 4 (four) hours as needed for wheezing or shortness of breath. 09/26/18   Jerrol Banana., MD  amLODipine (NORVASC) 5 MG tablet  09/27/18   [provider]  aspirin EC 81 MG EC tablet Take 1 tablet (81 mg total) by mouth daily. 05/08/18   Mayo, Pete Pelt, MD  atorvastatin (LIPITOR) 40 MG tablet Take 1 tablet (40 mg total) by mouth daily at 6 PM. 07/19/18   Jerrol Banana., MD  budesonide-formoterol [facility]) 80-4.5 MCG/ACT inhaler Inhale 2 puffs into the lungs 2 (two) times daily. 09/25/18   Fritzi Mandes, MD  clopidogrel (PLAVIX) 75 MG tablet TAKE 1 TABLET BY MOUTH EVERY DAY 08/30/18   Jerrol Banana., MD  fluticasone [facility]) 50 MCG/ACT nasal spray Place 2 sprays into both nostrils daily. 07/21/17   Bacigalupo, Dionne Bucy, MD  gabapentin (NEURONTIN) 600 MG tablet Take 0.5 tablets (300 mg total) by mouth 2 (two) times daily. 06/02/18   Jerrol Banana., MD  glimepiride (AMARYL) 2 MG tablet Take 1  tablet (2 mg total) by mouth 2 (two) times daily. 06/02/18   Jerrol Banana., MD  isosorbide mononitrate (IMDUR) 30 MG 24 hr tablet TAKE 1 TABLET BY MOUTH  DAILY 04/20/18   Jerrol Banana., MD  LORazepam (ATIVAN) 1 MG tablet Take 1 tablet (1 mg total) by mouth 2 (two) times daily as needed for anxiety. 05/24/17   Jerrol Banana., MD  magnesium oxide (MAG-OX) 400 (241.3 MG) MG tablet Take 400 mg by mouth 2 (two) times daily.  08/04/13   [provider]  metFORMIN (GLUCOPHAGE) 500 MG tablet Take 1 tablet (500 mg total) by mouth 2 (two) times daily with a meal. 07/19/18   Jerrol Banana., MD  metoprolol tartrate (LOPRESSOR) 25 MG tablet Take 1 tablet (25 mg total) by mouth 2 (two) times daily. 06/02/18   Jerrol Banana., MD  mometasone (ELOCON) 0.1 % lotion PLACE 4 DROPS INTO EACH EAR CANAL AT BEDTIME AS NEEDED FOR DRY SKIN/ITCHING/DISCOMFORT. 05/17/18   [provider]  Multiple Vitamins-Minerals (ICAPS) CAPS  Take 1 capsule by mouth 2 (two) times daily.     [provider]  nitroGLYCERIN (NITROSTAT) 0.4 MG SL tablet Place 1 tablet (0.4 mg total) under the tongue every 5 (five) minutes as needed for chest pain. 09/22/18   Jerrol Banana., MD  [facility] ULTRA test strip USE TO CHECK BLOOD SUGAR ONCE DAILY 11/01/18   Jerrol Banana., MD  pantoprazole (PROTONIX) 40 MG tablet Take 1 tablet (40 mg total) by mouth daily. 06/02/18   Jerrol Banana., MD  sucralfate (CARAFATE) 1 g tablet  11/08/18   [provider]    Allergies Coconut fatty acids  Family History  Problem Relation Age of Onset  . Stroke Mother        Massive  . Heart attack Father        Massive  . Heart attack Brother   . Heart attack Brother   . Lung cancer Maternal Grandfather   . Heart attack Paternal Grandmother        MI    Social History Social History   Tobacco Use  . Smoking status: Former Smoker    Packs/day: 1.00    Years: 50.00    Pack  years: 50.00    Types: Cigarettes    Quit date: 10/03/2015    Years since quitting: 3.2  . Smokeless tobacco: Never Used  . Tobacco comment: smokes 3 cigs daily 05/06/15. Pt instructed to quit.  Substance Use Topics  . Alcohol use: No    Alcohol/week: 0.0 standard drinks  . Drug use: No      Review of Systems Constitutional: No fever/chills, positive LOC Eyes: No visual changes. ENT: No sore throat. Cardiovascular: Denies chest pain. Respiratory: Denies shortness of breath. Gastrointestinal: Positive abdominal pain no nausea, no vomiting.  No diarrhea.  No constipation. Genitourinary: Negative for dysuria.  Positive rectal bleeding Musculoskeletal: Negative for back pain. Skin: Negative for rash. Neurological: Negative for headaches, focal weakness or numbness. All other ROS negative ____________________________________________   PHYSICAL EXAM:  VITAL SIGNS: ED Triage Vitals  Enc Vitals Group     BP 12/27/18 1715 127/76     Pulse Rate 12/27/18 1715 63     Resp 12/27/18 1715 20     Temp 12/27/18 1715 98.2 F (36.8 C)     Temp Source 12/27/18 1715 Oral     SpO2 12/27/18 1715 99 %     Weight 12/27/18 1717 151 lb (68.5 kg)     Height 12/27/18 1717 5\' 4"  (1.626 m)     Head Circumference --      Peak Flow --      Pain Score 12/27/18 1716 0     Pain Loc --      Pain Edu? --      Excl. in [place]? --     Constitutional: Alert and oriented but diaphoretic and appears pale Eyes: Conjunctivae are normal. EOMI. Head: Atraumatic. Nose: No congestion/rhinnorhea. Mouth/Throat: Mucous membranes are moist.   Neck: No stridor. Trachea Midline. FROM Cardiovascular: Normal rate, regular rhythm. Grossly normal heart sounds.  Good peripheral circulation. Respiratory: Normal respiratory effort.  No retractions. Lungs CTAB. Gastrointestinal: Soft with mild lower abdominal tenderness.  No distention. No abdominal bruits.  Musculoskeletal: No lower extremity tenderness nor edema.  No joint  effusions. Neurologic:  Normal speech and language. No gross focal neurologic deficits are appreciated.  Skin: Diaphoretic.  No rash noted. Psychiatric: Mood and affect are normal. Speech and behavior are normal. GU:  Deferred   ____________________________________________   LABS (all labs ordered are listed, but only abnormal results are displayed)  Labs Reviewed  COMPREHENSIVE METABOLIC PANEL - Abnormal; Notable for the following components:      Result Value   Glucose, Bld 181 (*)    Creatinine, Ser 1.51 (*)    GFR calc non Af Amer 33 (*)    GFR calc Af Amer 39 (*)    All other components within normal limits  CBC - Abnormal; Notable for the following components:   RBC 3.59 (*)    Hemoglobin 10.9 (*)    HCT 33.9 (*)    All other components within normal limits  LACTIC ACID, PLASMA - Abnormal; Notable for the following components:   Lactic Acid, Venous 2.8 (*)    All other components within normal limits  GLUCOSE, CAPILLARY - Abnormal; Notable for the following components:   Glucose-Capillary 167 (*)    All other components within normal limits  SARS CORONAVIRUS 2 (HOSPITAL ORDER, [place] LAB)  PROTIME-INR  APTT  LACTIC ACID, PLASMA  POC OCCULT BLOOD, ED  TYPE AND SCREEN  TYPE AND SCREEN   ____________________________________________   ED ECG REPORT I, Vanessa Baggs, the attending physician, personally viewed and interpreted this ECG.  EKG is sinus bradycardic rate of 55, no ST elevation, no T wave inversion, normal intervals ____________________________________________  RADIOLOGY   Official radiology report(s): Ct Angio Abd/pel W/ And/or W/o  Result Date: 12/27/2018 CLINICAL DATA:  Bright red blood per rectum. EXAM: CTA ABDOMEN AND PELVIS wITHOUT AND WITH CONTRAST TECHNIQUE: Multidetector CT imaging of the abdomen and pelvis was performed using the standard protocol during bolus administration of intravenous contrast. Multiplanar  reconstructed images and MIPs were obtained and reviewed to evaluate the vascular anatomy. CONTRAST:  60mL OMNIPAQUE IOHEXOL 350 MG/ML SOLN COMPARISON:  PET-CT 03/17/2016 FINDINGS: VASCULAR Aorta: Moderate calcified atheromatous plaque throughout. Moderate nonocclusive mural thrombus in the infrarenal segment. No aneurysm, dissection, or stenosis. Celiac: Patent without evidence of aneurysm, dissection, vasculitis or significant stenosis. SMA: Patent without evidence of aneurysm, dissection, vasculitis or significant stenosis. Classic distal branch anatomy. Renals: Single left, with ostial plaque resulting in short segment stenosis of at least mild severity. Single right, patent IMA: Patent without evidence of aneurysm, dissection, vasculitis or significant stenosis. Inflow: On the right, calcified plaque and thrombus in the proximal common iliac resulting in short-segment stenosis of at least mild severity. There is a tandem mild stenosis at the common iliac bifurcation involving the internal iliac origin. Eccentric plaque through the external iliac without high-grade stenosis. On the left, eccentric calcified plaque through the iliac arterial system without high-grade stenosis or aneurysm. Proximal Outflow: Common femoral arteries patent. Short-segment stenosis in the proximal right SFA of possible hemodynamic significance. Veins: Patent hepatic veins, portal vein, SMV, splenic vein, bilateral renal veins, IVC. No venous pathology is identified. Review of the MIP images confirms the above findings. NON-VASCULAR Lower chest: No acute abnormality. Hepatobiliary: No focal liver abnormality is seen. Status post cholecystectomy. No biliary dilatation. Pancreas: Unremarkable. No pancreatic ductal dilatation or surrounding inflammatory changes. Spleen: Normal in size without focal abnormality. Adrenals/Urinary Tract: 2.6 cm adrenal adenoma, stable. Kidneys unremarkable. Urinary bladder nondistended. Stomach/Bowel: Small  hiatal hernia. Hyperdense material within the gastric lumen. Stomach is nondistended. Small bowel decompressed. Normal appendix. Moderate colonic fecal material without dilatation. No focal wall thickening. Scattered distal descending and sigmoid diverticula without adjacent inflammatory/edematous change or abscess. No intraluminal extravasation identified on arterial phase  or delayed scans. Lymphatic: No abdominal or pelvic adenopathy. Reproductive: Status post hysterectomy. No adnexal masses. Other: No ascites. Bilateral pelvic phleboliths. No free air. Musculoskeletal: Small umbilical hernia containing only mesenteric fat. Mild spondylitic changes in the lower lumbar spine. No fracture or worrisome bone lesion. IMPRESSION: 1. No evidence of active GI bleeding at this time. 2. Distal descending and sigmoid diverticulosis. 3. Small hiatal hernia. 4. Stable left adrenal adenoma. 5. Right iliac and SFA stenoses of possible hemodynamic significance. Correlate with clinical symptomatology and ABIs. Electronically Signed   By: Lucrezia Europe M.D.   On: 12/27/2018 19:46    ____________________________________________   PROCEDURES  Procedure(s) performed (including Critical Care):  Procedures   ____________________________________________   INITIAL IMPRESSION / ASSESSMENT AND PLAN / ED COURSE  Kamyra D Blumenfeld was evaluated in Emergency Department on 12/27/2018 for the symptoms described in the history of present illness. She was evaluated in the context of the global COVID-19 pandemic, which necessitated consideration that the patient might be at risk for infection with the SARS-CoV-2 virus that causes COVID-19. Institutional protocols and algorithms that pertain to the evaluation of patients at risk for COVID-19 are in a state of rapid change based on information released by regulatory bodies including the CDC and federal and state organizations. These policies and algorithms were followed during the  patient's care in the ED.    Patient is a 78 year old who presented with 1 day of rectal bleeding had syncope before getting to the room.  Patient was pale and diaphoretic.  Considered is being secondary to vasovagal but given how pale patient is and she denies straining while in the bathroom will get labs to evaluate for anemia, coagulopathy and CT scan to evaluate for lower GI bleed.  Patient denies vomiting blood or risk factors for ulcer or gastritis.  Lactate slightly elevated 2.8  Hemoglobin was around baseline at 10.9  No evidence of coagulopathy.   CT scan with diverticulosis.  Reevaluated patient.  Patient says she is a still feels weak and off.  Given patient did have an episode of syncope in the presence of GI bleed will discuss to the hospital team for admission for trending hemoglobins and further evaluation.  Admit for syncope and GI bleed.   ____________________________________________   FINAL CLINICAL IMPRESSION(S) / ED DIAGNOSES   Final diagnoses:  Rectal bleeding  Syncope, unspecified syncope type      MEDICATIONS GIVEN DURING THIS VISIT:  Medications  sodium chloride 0.9 % bolus 500 mL (has no administration in time range)  pantoprazole (PROTONIX) 80 mg in sodium chloride 0.9 % 100 mL IVPB (0 mg Intravenous Stopped 12/27/18 1910)  iohexol (OMNIPAQUE) 350 MG/ML injection 75 mL (75 mLs Intravenous Contrast Given 12/27/18 1902)     ED Discharge Orders    None       Note:  This document was prepared using Dragon voice recognition software and may include unintentional dictation errors.   Vanessa Saucier, MD 12/27/18 2009

## 2018-12-27 NOTE — ED Notes (Signed)
Pt given warm blankets. Pt's significant other given cup of water.

## 2018-12-27 NOTE — ED Notes (Signed)
Pt st feeling weak within the last couple of days. Pt noticed "blood" in stools this morning at 8am and "called Dr. Rosanna Randy" who send her here to get evaluated.

## 2018-12-27 NOTE — ED Notes (Signed)
Pt states she needed to have a bm, taken to triage bathroom per NT, Vet.

## 2018-12-27 NOTE — ED Notes (Signed)
Date and time results received: 12/27/18 1848 (use smartphrase ".now" to insert current time)  Test: LA Critical Value: 2.8  Name of Provider Notified: Jari Pigg MD  Orders Received? Or Actions Taken?: Actions Taken: md made aware MD funke

## 2018-12-27 NOTE — H&P (Signed)
[facility] NAME: [name] [name]    MR#:  [id number]  DATE OF BIRTH:  [birth year]  DATE OF ADMISSION:  12/27/2018  PRIMARY CARE PHYSICIAN: Jerrol Banana., MD   REQUESTING/REFERRING PHYSICIAN: Jari Pigg, MD  CHIEF COMPLAINT:   Chief Complaint  Patient presents with  . Rectal Bleeding    HISTORY OF PRESENT ILLNESS:  [name] [name]  is a 78 y.o. female who presents with chief complaint as above.  Patient presents the ED with a complaint of rectal bleeding and syncopal episode.  She states that she has been struggling with constipation, and she had a particularly difficult bowel movement early in the morning.  Sometime later she went back for another bowel movement and had a fair amount of red blood in the toilet.  She then had a syncopal event on the toilet after that bowel movement.  Here in the ED her work-up is largely within normal limits.  CT scan does show a cyst without evidence of active GI bleed.  Hospitalist were called for admission  PAST MEDICAL HISTORY:   Past Medical History:  Diagnosis Date  . Abnormal CT lung screening 10/17/2015  . COPD (chronic obstructive pulmonary disease) ([place])   . Coronary artery disease, non-occlusive    a. cath 2006: min nonobs CAD; b. cath 12/2010: cath LAD 50%, RCA 60%; c. 08/2013: Minimal luminal irregs, right dominant system with no significant CAD, diffuse luminal irregs noted. Normal EF 55%, no AS or MS.   . Diabetes mellitus   . Hyperlipemia    Followed by Dr. Rosanna Randy  . Hypertension   . Lung cancer ([place])   . Macular degeneration    rt  . Personal history of tobacco use, presenting hazards to health 10/15/2015  . Pneumonia    hx  . Shortness of breath   . Stroke [facility])      PAST SURGICAL HISTORY:   Past Surgical History:  Procedure Laterality Date  . ABDOMINAL HYSTERECTOMY    . ANTERIOR CERVICAL DECOMP/DISCECTOMY FUSION N/A 10/19/2013   Procedure: CERVICAL FIVE-SIX  ANTERIOR CERVICAL DECOMPRESSION WITH FUSION INTERBODY PROSTHESIS PLATING AND PEEK CAGE;  Surgeon: Ophelia Charter, MD;  Location: [place] [facility]URO ORS;  Service: Neurosurgery;  Laterality: N/A;  . BACK SURGERY  80's  . BREAST CYST EXCISION Left    left negative   . CARDIAC CATHETERIZATION  05/2004  . CATARACT EXTRACTION Left   . CHOLECYSTECTOMY    . ENDARTERECTOMY Left 10/24/2014   Procedure: ENDARTERECTOMY CAROTID;  Surgeon: Algernon Huxley, MD;  Location: [facility] ORS;  Service: Vascular;  Laterality: Left;  . ENDOBRONCHIAL ULTRASOUND N/A 11/14/2015   Procedure: ENDOBRONCHIAL ULTRASOUND;  Surgeon: [name], MD;  Location: [facility] ORS;  Service: Cardiopulmonary;  Laterality: N/A;  . PERIPHERAL VASCULAR CATHETERIZATION N/A 12/04/2015   Procedure: Glori Luis Cath Insertion;  Surgeon: Algernon Huxley, MD;  Location: [place] CV LAB;  Service: Cardiovascular;  Laterality: N/A;  . PORTA CATH REMOVAL N/A 05/17/2017   Procedure: PORTA CATH REMOVAL;  Surgeon: Algernon Huxley, MD;  Location: [place] CV LAB;  Service: Cardiovascular;  Laterality: N/A;  . TONSILLECTOMY AND ADENOIDECTOMY    . VESICOVAGINAL FISTULA CLOSURE W/ TAH       SOCIAL HISTORY:   Social History   Tobacco Use  . Smoking status: Former Smoker    Packs/day: 1.00    Years: 50.00    Pack years: 50.00    Types: Cigarettes    Quit  date: 10/03/2015    Years since quitting: 3.2  . Smokeless tobacco: Never Used  . Tobacco comment: smokes 3 cigs daily 05/06/15. Pt instructed to quit.  Substance Use Topics  . Alcohol use: No    Alcohol/week: 0.0 standard drinks     FAMILY HISTORY:   Family History  Problem Relation Age of Onset  . Stroke Mother        Massive  . Heart attack Father        Massive  . Heart attack Brother   . Heart attack Brother   . Lung cancer Maternal Grandfather   . Heart attack Paternal Grandmother        MI     DRUG ALLERGIES:   Allergies  Allergen Reactions  . Coconut Fatty Acids Swelling and Other (See  Comments)    Throat swells    MEDICATIONS AT HOME:   Prior to Admission medications   Medication Sig Start Date End Date Taking? Authorizing Provider  amLODipine (NORVASC) 5 MG tablet Take 5 mg by mouth daily.  09/27/18  Yes [provider]  aspirin EC 81 MG EC tablet Take 1 tablet (81 mg total) by mouth daily. 05/08/18  Yes Mayo, Pete Pelt, MD  atorvastatin (LIPITOR) 40 MG tablet Take 1 tablet (40 mg total) by mouth daily at 6 PM. 07/19/18  Yes Jerrol Banana., MD  clopidogrel (PLAVIX) 75 MG tablet TAKE 1 TABLET BY MOUTH EVERY DAY Patient taking differently: Take 75 mg by mouth daily.  08/30/18  Yes Jerrol Banana., MD  gabapentin (NEURONTIN) 600 MG tablet Take 0.5 tablets (300 mg total) by mouth 2 (two) times daily. 06/02/18  Yes Jerrol Banana., MD  glimepiride (AMARYL) 2 MG tablet Take 1 tablet (2 mg total) by mouth 2 (two) times daily. 06/02/18  Yes Jerrol Banana., MD  isosorbide mononitrate (IMDUR) 30 MG 24 hr tablet TAKE 1 TABLET BY MOUTH  DAILY Patient taking differently: Take 30 mg by mouth daily.  04/20/18  Yes Jerrol Banana., MD  magnesium oxide (MAG-OX) 400 (241.3 MG) MG tablet Take 400 mg by mouth 2 (two) times daily.  08/04/13  Yes [provider]  metFORMIN (GLUCOPHAGE) 500 MG tablet Take 1 tablet (500 mg total) by mouth 2 (two) times daily with a meal. Patient taking differently: Take 500 mg by mouth 2 (two) times daily with a meal. Can only tolerate once daily with meal 07/19/18  Yes Jerrol Banana., MD  metoprolol succinate (TOPROL-XL) 100 MG 24 hr tablet Take 100 mg by mouth daily. 11/14/18  Yes [provider]  Multiple Vitamin (MULTIVITAMIN) tablet Take 0.5 tablets by mouth 2 (two) times daily.   Yes [provider]  Multiple Vitamins-Minerals (ICAPS) CAPS Take 1 capsule by mouth 2 (two) times daily.    Yes [provider]  pantoprazole (PROTONIX) 40 MG tablet Take 1 tablet (40 mg total) by mouth  daily. 06/02/18  Yes Jerrol Banana., MD  sucralfate (CARAFATE) 1 g tablet Take 1 g by mouth 2 (two) times daily.  11/08/18  Yes [provider]  vitamin E (VITAMIN E) 400 UNIT capsule Take 400 Units by mouth daily.   Yes [provider]  albuterol (VENTOLIN HFA) 108 (90 Base) MCG/ACT inhaler Inhale 2 puffs into the lungs every 4 (four) hours as needed for wheezing or shortness of breath. 09/26/18   Jerrol Banana., MD  budesonide-formoterol [facility]) 80-4.5 MCG/ACT inhaler Inhale  2 puffs into the lungs 2 (two) times daily. 09/25/18   Fritzi Mandes, MD  fluticasone (FLONASE) 50 MCG/ACT nasal spray Place 2 sprays into both nostrils daily. 07/21/17   Bacigalupo, Dionne Bucy, MD  LORazepam (ATIVAN) 1 MG tablet Take 1 tablet (1 mg total) by mouth 2 (two) times daily as needed for anxiety. 05/24/17   Jerrol Banana., MD  mometasone (ELOCON) 0.1 % lotion PLACE 4 DROPS INTO EACH EAR CANAL AT BEDTIME AS NEEDED FOR DRY SKIN/ITCHING/DISCOMFORT. 05/17/18   [provider]  nitroGLYCERIN (NITROSTAT) 0.4 MG SL tablet Place 1 tablet (0.4 mg total) under the tongue every 5 (five) minutes as needed for chest pain. 09/22/18   Jerrol Banana., MD  [facility] ULTRA test strip USE TO CHECK BLOOD SUGAR ONCE DAILY 11/01/18   Jerrol Banana., MD    REVIEW OF SYSTEMS:  Review of Systems  Constitutional: Negative for chills, fever, malaise/fatigue and weight loss.  HENT: Negative for ear pain, hearing loss and tinnitus.   Eyes: Negative for blurred vision, double vision, pain and redness.  Respiratory: Negative for cough, hemoptysis and shortness of breath.   Cardiovascular: Negative for chest pain, palpitations, orthopnea and leg swelling.  Gastrointestinal: Positive for blood in stool. Negative for abdominal pain, constipation, diarrhea, nausea and vomiting.  Genitourinary: Negative for dysuria, frequency and hematuria.  Musculoskeletal: Negative for back pain, joint pain  and neck pain.  Skin:       No acne, rash, or lesions  Neurological: Positive for loss of consciousness. Negative for dizziness, tremors, focal weakness and weakness.  Endo/Heme/Allergies: Negative for polydipsia. Does not bruise/bleed easily.  Psychiatric/Behavioral: Negative for depression. The patient is not nervous/anxious and does not have insomnia.      VITAL SIGNS:   Vitals:   12/27/18 1800 12/27/18 1949 12/27/18 2000 12/27/18 2030  BP: (!) 122/92 117/65 (!) 114/56 129/63  Pulse:  (!) 54 60 60  Resp:  14 18 18   Temp:      TempSrc:      SpO2:  95% 93% 92%  Weight:      Height:       Wt Readings from Last 3 Encounters:  12/27/18 68.5 kg  11/10/18 65.9 kg  10/03/18 68.5 kg    PHYSICAL EXAMINATION:  Physical Exam  Vitals reviewed. Constitutional: She is oriented to person, place, and time. She appears well-developed and well-nourished. No distress.  HENT:  Head: Normocephalic and atraumatic.  Mouth/Throat: Oropharynx is clear and moist.  Eyes: Pupils are equal, round, and reactive to light. Conjunctivae and EOM are normal. No scleral icterus.  Neck: Normal range of motion. Neck supple. No JVD present. No thyromegaly present.  Cardiovascular: Normal rate, regular rhythm and intact distal pulses. Exam reveals no gallop and no friction rub.  No murmur heard. Respiratory: Effort normal and breath sounds normal. No respiratory distress. She has no wheezes. She has no rales.  GI: Soft. Bowel sounds are normal. She exhibits no distension. There is no abdominal tenderness.  Musculoskeletal: Normal range of motion.        General: No edema.     Comments: No arthritis, no gout  Lymphadenopathy:    She has no cervical adenopathy.  Neurological: She is alert and oriented to person, place, and time. No cranial nerve deficit.  No dysarthria, no aphasia  Skin: Skin is warm and dry. No rash noted. No erythema.  Psychiatric: She has a normal mood and affect. Her behavior is normal.  Judgment  and thought content normal.    LABORATORY PANEL:   CBC Recent Labs  Lab 12/27/18 1801  WBC 9.4  HGB 10.9*  HCT 33.9*  PLT 251   ------------------------------------------------------------------------------------------------------------------  Chemistries  Recent Labs  Lab 12/27/18 1801  NA 138  K 4.7  CL 99  CO2 26  GLUCOSE 181*  BUN 23  CREATININE 1.51*  CALCIUM 9.3  AST 19  ALT 19  ALKPHOS 62  BILITOT 0.6   ------------------------------------------------------------------------------------------------------------------  Cardiac Enzymes No results for input(s): TROPONINI in the last 168 hours. ------------------------------------------------------------------------------------------------------------------  RADIOLOGY:  Ct Angio Abd/pel W/ And/or W/o  Result Date: 12/27/2018 CLINICAL DATA:  Bright red blood per rectum. EXAM: CTA ABDOMEN AND PELVIS wITHOUT AND WITH CONTRAST TECHNIQUE: Multidetector CT imaging of the abdomen and pelvis was performed using the standard protocol during bolus administration of intravenous contrast. Multiplanar reconstructed images and MIPs were obtained and reviewed to evaluate the vascular anatomy. CONTRAST:  36mL OMNIPAQUE IOHEXOL 350 MG/ML SOLN COMPARISON:  PET-CT 03/17/2016 FINDINGS: VASCULAR Aorta: Moderate calcified atheromatous plaque throughout. Moderate nonocclusive mural thrombus in the infrarenal segment. No aneurysm, dissection, or stenosis. Celiac: Patent without evidence of aneurysm, dissection, vasculitis or significant stenosis. SMA: Patent without evidence of aneurysm, dissection, vasculitis or significant stenosis. Classic distal branch anatomy. Renals: Single left, with ostial plaque resulting in short segment stenosis of at least mild severity. Single right, patent IMA: Patent without evidence of aneurysm, dissection, vasculitis or significant stenosis. Inflow: On the right, calcified plaque and thrombus in the  proximal common iliac resulting in short-segment stenosis of at least mild severity. There is a tandem mild stenosis at the common iliac bifurcation involving the internal iliac origin. Eccentric plaque through the external iliac without high-grade stenosis. On the left, eccentric calcified plaque through the iliac arterial system without high-grade stenosis or aneurysm. Proximal Outflow: Common femoral arteries patent. Short-segment stenosis in the proximal right SFA of possible hemodynamic significance. Veins: Patent hepatic veins, portal vein, SMV, splenic vein, bilateral renal veins, IVC. No venous pathology is identified. Review of the MIP images confirms the above findings. NON-VASCULAR Lower chest: No acute abnormality. Hepatobiliary: No focal liver abnormality is seen. Status post cholecystectomy. No biliary dilatation. Pancreas: Unremarkable. No pancreatic ductal dilatation or surrounding inflammatory changes. Spleen: Normal in size without focal abnormality. Adrenals/Urinary Tract: 2.6 cm adrenal adenoma, stable. Kidneys unremarkable. Urinary bladder nondistended. Stomach/Bowel: Small hiatal hernia. Hyperdense material within the gastric lumen. Stomach is nondistended. Small bowel decompressed. Normal appendix. Moderate colonic fecal material without dilatation. No focal wall thickening. Scattered distal descending and sigmoid diverticula without adjacent inflammatory/edematous change or abscess. No intraluminal extravasation identified on arterial phase or delayed scans. Lymphatic: No abdominal or pelvic adenopathy. Reproductive: Status post hysterectomy. No adnexal masses. Other: No ascites. Bilateral pelvic phleboliths. No free air. Musculoskeletal: Small umbilical hernia containing only mesenteric fat. Mild spondylitic changes in the lower lumbar spine. No fracture or worrisome bone lesion. IMPRESSION: 1. No evidence of active GI bleeding at this time. 2. Distal descending and sigmoid diverticulosis.  3. Small hiatal hernia. 4. Stable left adrenal adenoma. 5. Right iliac and SFA stenoses of possible hemodynamic significance. Correlate with clinical symptomatology and ABIs. Electronically Signed   By: Lucrezia Europe M.D.   On: 12/27/2018 19:46    EKG:   Orders placed or performed during the hospital encounter of 12/27/18  . EKG 12-Lead  . EKG 12-Lead    IMPRESSION AND PLAN:  Principal Problem:   Syncope -unclear at right etiology, concerned this could be  vagal either secondary to acute blood loss from a bleeding event, or secondary to her difficult bowel movements.  We will check cardiac enzymes, get an echocardiogram, and monitor on telemetry tonight.  If any abnormality detected, then might consider cardiology consult Active Problems:   CAD (coronary artery disease) -continue home meds, other work-up as above   Blood per rectum -unclear if this is related to a potential start and stop diverticular bleed, versus internal hemorrhoids.  Her hemoglobin initially here in the ED is steady from prior values.  We will check serial hemoglobin tonight, and if any further bleeding event or decrease in hemoglobin would get GI involved   Constipation -PRN stool softener/laxative   HYPERTENSION, BENIGN -continue home dose antihypertensives   COPD (chronic obstructive pulmonary disease) with emphysema ([place]) -continue home meds   HLD (hyperlipidemia) -home dose antilipid   Adult hypothyroidism -home dose thyroid replacement  Chart review performed and case discussed with ED provider. Labs, imaging and/or ECG reviewed by provider and discussed with patient/family. Management plans discussed with the patient and/or family.  COVID-19 status: Pending  DVT PROPHYLAXIS: SubQ lovenox   GI PROPHYLAXIS:  PPI   ADMISSION STATUS: Observation  CODE STATUS: DNR Code Status History    Date Active Date Inactive Code Status Order ID Comments User Context   09/23/2018 1034 09/25/2018 1539 DNR [id number]  Dustin Flock, MD Inpatient   09/23/2018 0327 09/23/2018 1034 Full Code [id number]  Mansy, Arvella Merles, MD ED   05/06/2018 1439 05/07/2018 1739 Full Code [id number]  Gladstone Lighter, MD ED   03/30/2017 1959 04/03/2017 1402 Full Code [id number]  Demetrios Loll, MD Inpatient   07/15/2016 0403 07/16/2016 1858 Full Code [id number]  Harrie Foreman, MD Inpatient   02/05/2016 1510 02/13/2016 1811 Full Code [id number]  Henreitta Leber, MD Inpatient   01/27/2015 2148 01/29/2015 1759 Full Code [id number]  Hower, Aaron Mose, MD ED   10/24/2014 1203 10/25/2014 1626 Full Code [id number]  Algernon Huxley, MD Inpatient   08/30/2014 0105 08/30/2014 1948 Full Code [id number]  Lance Coon, MD Inpatient   10/19/2013 1619 10/20/2013 1604 Full Code [id number]  Newman Pies, MD Inpatient   Advance Care Planning Activity    Questions for Most Recent Historical Code Status (Order [id number])    Question Answer Comment   In the event of cardiac or respiratory ARREST Do not call a "code blue"    In the event of cardiac or respiratory ARREST Do not perform Intubation, CPR, defibrillation or ACLS    In the event of cardiac or respiratory ARREST Use medication by any route, position, wound care, and other measures to relive pain and suffering. May use oxygen, suction and manual treatment of airway obstruction as needed for comfort.       TOTAL TIME TAKING CARE OF THIS PATIENT: 40 minutes.   This patient was evaluated in the context of the global COVID-19 pandemic, which necessitated consideration that the patient might be at risk for infection with the SARS-CoV-2 virus that causes COVID-19. Institutional protocols and algorithms that pertain to the evaluation of patients at risk for COVID-19 are in a state of rapid change based on information released by regulatory bodies including the CDC and federal and state organizations. These policies and algorithms were followed to the best of this provider's knowledge to date during the patient's care at this  facility.  Ethlyn Daniels 12/27/2018, 9:38 PM  [facility]  Office  [phone]  CC: Primary care physician; Eulas Post  Brooke Bonito., MD  Note:  This document was prepared using Dragon voice recognition software and may include unintentional dictation errors.

## 2018-12-27 NOTE — ED Notes (Signed)
Pt ambulated to toilet with a 1x assist. Pt back in bed safetly and on the monitor at this time.

## 2018-12-27 NOTE — ED Notes (Signed)
Pt brought back by triage RN Theadora Rama, who st pt passed out in wheelchair after having a BM. Pt pale and diaphoretic at this time. MD Jari Pigg at bedside.

## 2018-12-27 NOTE — ED Notes (Signed)
ED TO INPATIENT HANDOFF REPORT  ED Nurse Name and Phone #: [phone]  S Name/Age/Gender [name] [name] 78 y.o. female Room/Bed: ED13A/ED13A  Code Status   Code Status: Prior  Home/SNF/Other Home A/Ox4Is this baseline? Yes   Triage Complete: Triage complete  Chief Complaint rectal bleed  Triage Note Pt states today she had a pain in her lower abd, went to the bathroom and passed bright red blood rectally. States this hasn't happened before. Denies dizziness, c/o weakness at this time, pt is pale. NAD.    Allergies Allergies  Allergen Reactions  . Coconut Fatty Acids Swelling and Other (See Comments)    Throat swells    Level of Care/Admitting Diagnosis ED Disposition    None      B Medical/Surgery History Past Medical History:  Diagnosis Date  . Abnormal CT lung screening 10/17/2015  . COPD (chronic obstructive pulmonary disease) ([place])   . Coronary artery disease, non-occlusive    a. cath 2006: min nonobs CAD; b. cath 12/2010: cath LAD 50%, RCA 60%; c. 08/2013: Minimal luminal irregs, right dominant system with no significant CAD, diffuse luminal irregs noted. Normal EF 55%, no AS or MS.   . Diabetes mellitus   . Hyperlipemia    Followed by Dr. Rosanna Randy  . Hypertension   . Lung cancer ([place])   . Macular degeneration    rt  . Personal history of tobacco use, presenting hazards to health 10/15/2015  . Pneumonia    hx  . Shortness of breath   . Stroke [facility])    Past Surgical History:  Procedure Laterality Date  . ABDOMINAL HYSTERECTOMY    . ANTERIOR CERVICAL DECOMP/DISCECTOMY FUSION N/A 10/19/2013   Procedure: CERVICAL FIVE-SIX ANTERIOR CERVICAL DECOMPRESSION WITH FUSION INTERBODY PROSTHESIS PLATING AND PEEK CAGE;  Surgeon: Ophelia Charter, MD;  Location: [place] [facility];  Service: Neurosurgery;  Laterality: N/A;  . BACK SURGERY  80's  . BREAST CYST EXCISION Left    left negative   . CARDIAC CATHETERIZATION  05/2004  . CATARACT EXTRACTION Left   . CHOLECYSTECTOMY     . ENDARTERECTOMY Left 10/24/2014   Procedure: ENDARTERECTOMY CAROTID;  Surgeon: Algernon Huxley, MD;  Location: [facility] ORS;  Service: Vascular;  Laterality: Left;  . ENDOBRONCHIAL ULTRASOUND N/A 11/14/2015   Procedure: ENDOBRONCHIAL ULTRASOUND;  Surgeon: [name], MD;  Location: [facility] ORS;  Service: Cardiopulmonary;  Laterality: N/A;  . PERIPHERAL VASCULAR CATHETERIZATION N/A 12/04/2015   Procedure: Glori Luis Cath Insertion;  Surgeon: Algernon Huxley, MD;  Location: [place] CV LAB;  Service: Cardiovascular;  Laterality: N/A;  . PORTA CATH REMOVAL N/A 05/17/2017   Procedure: PORTA CATH REMOVAL;  Surgeon: Algernon Huxley, MD;  Location: [place] [facility];  Service: Cardiovascular;  Laterality: N/A;  . TONSILLECTOMY AND ADENOIDECTOMY    . VESICOVAGINAL FISTULA CLOSURE W/ TAH       A IV Location/Drains/Wounds Patient Lines/Drains/Airways Status   Active Line/Drains/Airways    Name:   Placement date:   Placement time:   Site:   Days:   Peripheral IV 12/27/18 Left Forearm   12/27/18    1800    Forearm   less than 1   Peripheral IV 12/27/18 Right Antecubital   12/27/18    1811    Antecubital   less than 1   External Urinary Catheter   05/06/18    1645    -   235          Intake/Output Last 24 hours No intake  or output data in the 24 hours ending 12/27/18 1918  Labs/Imaging Results for orders placed or performed during the hospital encounter of 12/27/18 (from the past 48 hour(s))  Comprehensive metabolic panel     Status: Abnormal   Collection Time: 12/27/18  6:01 PM  Result Value Ref Range   Sodium 138 135 - 145 mmol/L   Potassium 4.7 3.5 - 5.1 mmol/L   Chloride 99 98 - 111 mmol/L   CO2 26 22 - 32 mmol/L   Glucose, Bld 181 (H) 70 - 99 mg/dL   BUN 23 8 - 23 mg/dL   Creatinine, Ser 1.51 (H) 0.44 - 1.00 mg/dL   Calcium 9.3 8.9 - 10.3 mg/dL   Total Protein 7.0 6.5 - 8.1 g/dL   Albumin 3.9 3.5 - 5.0 g/dL   AST 19 15 - 41 U/L   ALT 19 0 - 44 U/L   Alkaline Phosphatase 62 38 - 126 U/L   Total  Bilirubin 0.6 0.3 - 1.2 mg/dL   GFR calc non Af Amer 33 (L) >60 mL/min   GFR calc Af Amer 39 (L) >60 mL/min   Anion gap 13 5 - 15    Comment: Performed at [facility], [address] [place], [place] [address]  CBC     Status: Abnormal   Collection Time: 12/27/18  6:01 PM  Result Value Ref Range   WBC 9.4 4.0 - 10.5 K/uL   RBC 3.59 (L) 3.87 - 5.11 MIL/uL   Hemoglobin 10.9 (L) 12.0 - 15.0 g/dL   HCT 33.9 (L) 36.0 - 46.0 %   MCV 94.4 80.0 - 100.0 fL   MCH 30.4 26.0 - 34.0 pg   MCHC 32.2 30.0 - 36.0 g/dL   RDW 14.3 11.5 - 15.5 %   Platelets 251 150 - 400 K/uL   nRBC 0.0 0.0 - 0.2 %    Comment: Performed at [facility], [address] [place], [place] [address]  Type and screen [place]     Status: None (Preliminary result)   Collection Time: 12/27/18  6:01 PM  Result Value Ref Range   ABO/RH(D) PENDING    Antibody Screen PENDING    Sample Expiration      12/30/2018,2359 Performed at  Hospital Lab, [address] [place], [place] [address]   Protime-INR     Status: None   Collection Time: 12/27/18  6:02 PM  Result Value Ref Range   Prothrombin Time 12.3 11.4 - 15.2 seconds   INR 0.9 0.8 - 1.2    Comment: (NOTE) INR goal varies based on device and disease states. Performed at [facility], [address] [place], [place] [address]   APTT     Status: None   Collection Time: 12/27/18  6:02 PM  Result Value Ref Range   aPTT 28 24 - 36 seconds    Comment: Performed at [facility], [address] [place],  [address]  Glucose, capillary     Status: Abnormal   Collection Time: 12/27/18  6:08 PM  Result Value Ref Range   Glucose-Capillary 167 (H) 70 - 99 mg/dL  Lactic acid, plasma     Status: Abnormal   Collection Time: 12/27/18  6:09 PM  Result Value Ref Range   Lactic Acid, Venous 2.8 (HH) 0.5 - 1.9 mmol/L    Comment: CRITICAL RESULT CALLED TO, READ BACK BY AND VERIFIED WITH JESSICA Oscar Forman  AT 1846 12/27/2018  TFK Performed at [facility]  Lab, [address] [place], [place] [address]   Type and screen     Status: None (Preliminary result)   Collection Time: 12/27/18  6:38 PM  Result Value Ref Range   ABO/RH(D) PENDING    Antibody Screen PENDING    Sample Expiration      12/30/2018,2359 Performed at [facility], [address] [place], [place] [address]    No results found.  Pending Labs Unresulted Labs (From admission, onward)    Start     Ordered   12/27/18 1803  Lactic acid, plasma  Now then every 2 hours,   STAT     12/27/18 1802   12/27/18 1803  SARS Coronavirus 2 [facility] order, Performed in [facility] hospital lab) Nasopharyngeal Nasopharyngeal Swab  (Symptomatic/High Risk of Exposure/Tier 1 Patients Labs with Precautions)  Once,   STAT    Question Answer Comment  Is this test for diagnosis or screening Diagnosis of ill patient   Symptomatic for COVID-19 as defined by CDC Yes   Date of Symptom Onset 12/27/2018   Hospitalized for COVID-19 No   Admitted to ICU for COVID-19 No   Previously tested for COVID-19 Yes   Resident in a congregate (group) care setting No   Employed in healthcare setting No   Pregnant No      12/27/18 1802          Vitals/Pain Today's Vitals   12/27/18 1715 12/27/18 1716 12/27/18 1717 12/27/18 1800  BP: 127/76   (!) 122/92  Pulse: 63     Resp: 20     Temp: 98.2 F (36.8 C)     TempSrc: Oral     SpO2: 99%     Weight:   68.5 kg   Height:   5\' 4"  (1.626 m)   PainSc:  0-No pain      Isolation Precautions No active isolations  Medications Medications  pantoprazole (PROTONIX) 80 mg in sodium chloride 0.9 % 100 mL IVPB (80 mg Intravenous New Bag/Given 12/27/18 1847)  iohexol (OMNIPAQUE) 350 MG/ML injection 75 mL (75 mLs Intravenous Contrast Given 12/27/18 1902)    Mobility walks with device Low fall risk      R Recommendations: See Admitting Provider Note  Report given to:   Additional  Notes

## 2018-12-27 NOTE — Telephone Encounter (Signed)
Patient's step-daughter called stating the [name] has been experiencing constipation today, but she did have a bowel movement that was very hard, she went back to the bathroom again and there was a lot of bright red blood. There is a constant flow of blood and she had to put her in the shower. Advised to call EMS so patient can be transported to ER for an evaluation.

## 2018-12-28 ENCOUNTER — Other Ambulatory Visit: Payer: Self-pay

## 2018-12-28 ENCOUNTER — Observation Stay: Payer: Medicare Other

## 2018-12-28 ENCOUNTER — Encounter: Payer: Self-pay | Admitting: Radiology

## 2018-12-28 ENCOUNTER — Observation Stay: Admit: 2018-12-28 | Payer: Medicare Other

## 2018-12-28 DIAGNOSIS — K922 Gastrointestinal hemorrhage, unspecified: Secondary | ICD-10-CM | POA: Diagnosis present

## 2018-12-28 DIAGNOSIS — I2510 Atherosclerotic heart disease of native coronary artery without angina pectoris: Secondary | ICD-10-CM | POA: Diagnosis not present

## 2018-12-28 DIAGNOSIS — Z7902 Long term (current) use of antithrombotics/antiplatelets: Secondary | ICD-10-CM | POA: Diagnosis not present

## 2018-12-28 DIAGNOSIS — K635 Polyp of colon: Secondary | ICD-10-CM | POA: Diagnosis not present

## 2018-12-28 DIAGNOSIS — J439 Emphysema, unspecified: Secondary | ICD-10-CM | POA: Diagnosis not present

## 2018-12-28 DIAGNOSIS — Z23 Encounter for immunization: Secondary | ICD-10-CM | POA: Diagnosis present

## 2018-12-28 DIAGNOSIS — K640 First degree hemorrhoids: Secondary | ICD-10-CM | POA: Diagnosis not present

## 2018-12-28 DIAGNOSIS — Z85118 Personal history of other malignant neoplasm of bronchus and lung: Secondary | ICD-10-CM | POA: Diagnosis not present

## 2018-12-28 DIAGNOSIS — E785 Hyperlipidemia, unspecified: Secondary | ICD-10-CM | POA: Diagnosis not present

## 2018-12-28 DIAGNOSIS — R55 Syncope and collapse: Secondary | ICD-10-CM | POA: Diagnosis not present

## 2018-12-28 DIAGNOSIS — Z823 Family history of stroke: Secondary | ICD-10-CM | POA: Diagnosis not present

## 2018-12-28 DIAGNOSIS — K5791 Diverticulosis of intestine, part unspecified, without perforation or abscess with bleeding: Secondary | ICD-10-CM | POA: Diagnosis not present

## 2018-12-28 DIAGNOSIS — K625 Hemorrhage of anus and rectum: Secondary | ICD-10-CM | POA: Diagnosis not present

## 2018-12-28 DIAGNOSIS — Z20828 Contact with and (suspected) exposure to other viral communicable diseases: Secondary | ICD-10-CM | POA: Diagnosis present

## 2018-12-28 DIAGNOSIS — I739 Peripheral vascular disease, unspecified: Secondary | ICD-10-CM | POA: Diagnosis not present

## 2018-12-28 DIAGNOSIS — K5730 Diverticulosis of large intestine without perforation or abscess without bleeding: Secondary | ICD-10-CM | POA: Diagnosis not present

## 2018-12-28 DIAGNOSIS — H3530 Unspecified macular degeneration: Secondary | ICD-10-CM | POA: Diagnosis not present

## 2018-12-28 DIAGNOSIS — Z8249 Family history of ischemic heart disease and other diseases of the circulatory system: Secondary | ICD-10-CM | POA: Diagnosis not present

## 2018-12-28 DIAGNOSIS — D122 Benign neoplasm of ascending colon: Secondary | ICD-10-CM | POA: Diagnosis not present

## 2018-12-28 DIAGNOSIS — Z66 Do not resuscitate: Secondary | ICD-10-CM | POA: Diagnosis not present

## 2018-12-28 DIAGNOSIS — I10 Essential (primary) hypertension: Secondary | ICD-10-CM | POA: Diagnosis not present

## 2018-12-28 DIAGNOSIS — K5900 Constipation, unspecified: Secondary | ICD-10-CM | POA: Diagnosis not present

## 2018-12-28 DIAGNOSIS — Z8673 Personal history of transient ischemic attack (TIA), and cerebral infarction without residual deficits: Secondary | ICD-10-CM | POA: Diagnosis not present

## 2018-12-28 DIAGNOSIS — D649 Anemia, unspecified: Secondary | ICD-10-CM | POA: Diagnosis not present

## 2018-12-28 DIAGNOSIS — K649 Unspecified hemorrhoids: Secondary | ICD-10-CM | POA: Diagnosis not present

## 2018-12-28 DIAGNOSIS — E039 Hypothyroidism, unspecified: Secondary | ICD-10-CM | POA: Diagnosis not present

## 2018-12-28 DIAGNOSIS — D126 Benign neoplasm of colon, unspecified: Secondary | ICD-10-CM | POA: Diagnosis not present

## 2018-12-28 DIAGNOSIS — F17210 Nicotine dependence, cigarettes, uncomplicated: Secondary | ICD-10-CM | POA: Diagnosis present

## 2018-12-28 DIAGNOSIS — Z981 Arthrodesis status: Secondary | ICD-10-CM | POA: Diagnosis not present

## 2018-12-28 DIAGNOSIS — Z7901 Long term (current) use of anticoagulants: Secondary | ICD-10-CM | POA: Diagnosis not present

## 2018-12-28 DIAGNOSIS — Z9049 Acquired absence of other specified parts of digestive tract: Secondary | ICD-10-CM | POA: Diagnosis not present

## 2018-12-28 DIAGNOSIS — J449 Chronic obstructive pulmonary disease, unspecified: Secondary | ICD-10-CM | POA: Diagnosis not present

## 2018-12-28 DIAGNOSIS — D62 Acute posthemorrhagic anemia: Secondary | ICD-10-CM | POA: Diagnosis not present

## 2018-12-28 DIAGNOSIS — Z91018 Allergy to other foods: Secondary | ICD-10-CM | POA: Diagnosis not present

## 2018-12-28 DIAGNOSIS — Z801 Family history of malignant neoplasm of trachea, bronchus and lung: Secondary | ICD-10-CM | POA: Diagnosis not present

## 2018-12-28 LAB — BASIC METABOLIC PANEL
Anion gap: 10 (ref 5–15)
BUN: 28 mg/dL — ABNORMAL HIGH (ref 8–23)
CO2: 27 mmol/L (ref 22–32)
Calcium: 8.3 mg/dL — ABNORMAL LOW (ref 8.9–10.3)
Chloride: 101 mmol/L (ref 98–111)
Creatinine, Ser: 1.41 mg/dL — ABNORMAL HIGH (ref 0.44–1.00)
GFR calc Af Amer: 42 mL/min — ABNORMAL LOW (ref >60)
GFR calc non Af Amer: 36 mL/min — ABNORMAL LOW (ref >60)
Glucose, Bld: 203 mg/dL — ABNORMAL HIGH (ref 70–99)
Potassium: 5.5 mmol/L — ABNORMAL HIGH (ref 3.5–5.1)
Sodium: 138 mmol/L (ref 135–145)

## 2018-12-28 LAB — CBC
HCT: 30.2 % — ABNORMAL LOW (ref 36.0–46.0)
Hemoglobin: 9.8 g/dL — ABNORMAL LOW (ref 12.0–15.0)
MCH: 30.9 pg (ref 26.0–34.0)
MCHC: 32.5 g/dL (ref 30.0–36.0)
MCV: 95.3 fL (ref 80.0–100.0)
Platelets: 167 10*3/uL (ref 150–400)
RBC: 3.17 MIL/uL — ABNORMAL LOW (ref 3.87–5.11)
RDW: 14.4 % (ref 11.5–15.5)
WBC: 8 10*3/uL (ref 4.0–10.5)
nRBC: 0 % (ref 0.0–0.2)

## 2018-12-28 LAB — PREPARE RBC (CROSSMATCH)

## 2018-12-28 LAB — LACTIC ACID, PLASMA: Lactic Acid, Venous: 1.9 mmol/L (ref 0.5–1.9)

## 2018-12-28 LAB — TROPONIN I (HIGH SENSITIVITY): Troponin I (High Sensitivity): 11 ng/L (ref <18)

## 2018-12-28 MED ORDER — PEG 3350-KCL-NA BICARB-NACL 420 G PO SOLR
4000.0000 mL | Freq: Once | ORAL | Status: AC
  Administered 2018-12-28: 4000 mL via ORAL
  Filled 2018-12-28: qty 4000

## 2018-12-28 MED ORDER — DIPHENHYDRAMINE HCL 25 MG PO CAPS
25.0000 mg | ORAL_CAPSULE | Freq: Once | ORAL | Status: AC
  Administered 2018-12-28: 02:00:00 25 mg via ORAL
  Filled 2018-12-28: qty 1

## 2018-12-28 MED ORDER — SODIUM CHLORIDE 0.9% IV SOLUTION
Freq: Once | INTRAVENOUS | Status: DC
  Filled 2018-12-28: qty 250

## 2018-12-28 MED ORDER — ONDANSETRON HCL 4 MG/2ML IJ SOLN
4.0000 mg | Freq: Four times a day (QID) | INTRAMUSCULAR | Status: DC | PRN
  Administered 2018-12-29 (×2): 4 mg via INTRAVENOUS
  Filled 2018-12-28 (×2): qty 2

## 2018-12-28 MED ORDER — MOMETASONE FURO-FORMOTEROL FUM 100-5 MCG/ACT IN AERO
2.0000 | INHALATION_SPRAY | Freq: Two times a day (BID) | RESPIRATORY_TRACT | Status: DC
  Administered 2018-12-28 – 2018-12-30 (×4): 2 via RESPIRATORY_TRACT
  Filled 2018-12-28: qty 8.8

## 2018-12-28 MED ORDER — PANTOPRAZOLE SODIUM 40 MG PO TBEC: 40.0000 mg | DELAYED_RELEASE_TABLET | Freq: Every day | ORAL | Status: DC

## 2018-12-28 MED ORDER — TECHNETIUM TC 99M-LABELED RED BLOOD CELLS IV KIT
25.0000 | PACK | Freq: Once | INTRAVENOUS | Status: AC | PRN
  Administered 2018-12-28: 22.384 via INTRAVENOUS

## 2018-12-28 MED ORDER — ISOSORBIDE MONONITRATE ER 30 MG PO TB24: 30.0000 mg | ORAL_TABLET | Freq: Every day | ORAL | Status: DC

## 2018-12-28 MED ORDER — ONDANSETRON HCL 4 MG PO TABS: 4.0000 mg | ORAL_TABLET | Freq: Four times a day (QID) | ORAL | Status: DC | PRN

## 2018-12-28 MED ORDER — CLOPIDOGREL BISULFATE 75 MG PO TABS
75.0000 mg | ORAL_TABLET | Freq: Every day | ORAL | Status: DC
  Filled 2018-12-28: qty 1

## 2018-12-28 MED ORDER — METOPROLOL SUCCINATE ER 100 MG PO TB24: 100.0000 mg | ORAL_TABLET | Freq: Every day | ORAL | Status: DC

## 2018-12-28 MED ORDER — INFLUENZA VAC A&B SA ADJ QUAD 0.5 ML IM PRSY
0.5000 mL | PREFILLED_SYRINGE | INTRAMUSCULAR | Status: AC
  Administered 2018-12-30: 0.5 mL via INTRAMUSCULAR
  Filled 2018-12-28: qty 0.5

## 2018-12-28 MED ORDER — AMLODIPINE BESYLATE 5 MG PO TABS: 5.0000 mg | ORAL_TABLET | Freq: Every day | ORAL | Status: DC

## 2018-12-28 MED ORDER — ATORVASTATIN CALCIUM 20 MG PO TABS
40.0000 mg | ORAL_TABLET | Freq: Every day | ORAL | Status: DC
  Administered 2018-12-28: 40 mg via ORAL
  Filled 2018-12-28: qty 2

## 2018-12-28 MED ORDER — SODIUM CHLORIDE 0.9 % IV SOLN
8.0000 mg/h | INTRAVENOUS | Status: DC
  Administered 2018-12-28 – 2018-12-30 (×5): 8 mg/h via INTRAVENOUS
  Filled 2018-12-28 (×5): qty 80

## 2018-12-28 MED ORDER — PANTOPRAZOLE SODIUM 40 MG IV SOLR: 40.0000 mg | Freq: Two times a day (BID) | INTRAVENOUS | Status: DC

## 2018-12-28 MED ORDER — SODIUM CHLORIDE 0.9 % IV SOLN
INTRAVENOUS | Status: DC
  Administered 2018-12-29: 08:00:00 via INTRAVENOUS

## 2018-12-28 MED ORDER — SODIUM CHLORIDE 0.9 % IV SOLN
INTRAVENOUS | Status: AC
  Administered 2018-12-28: 05:00:00 via INTRAVENOUS

## 2018-12-28 MED ORDER — ACETAMINOPHEN 325 MG PO TABS: 650.0000 mg | ORAL_TABLET | Freq: Four times a day (QID) | ORAL | Status: DC | PRN

## 2018-12-28 MED ORDER — ASPIRIN EC 81 MG PO TBEC
81.0000 mg | DELAYED_RELEASE_TABLET | Freq: Every day | ORAL | Status: DC
  Filled 2018-12-28: qty 1

## 2018-12-28 MED ORDER — HEPARIN SODIUM (PORCINE) 5000 UNIT/ML IJ SOLN: 5000.0000 [IU] | Freq: Three times a day (TID) | INTRAMUSCULAR | Status: DC

## 2018-12-28 MED ORDER — ACETAMINOPHEN 325 MG PO TABS
650.0000 mg | ORAL_TABLET | Freq: Once | ORAL | Status: AC
  Administered 2018-12-28: 650 mg via ORAL
  Filled 2018-12-28: qty 2

## 2018-12-28 MED ORDER — ACETAMINOPHEN 650 MG RE SUPP: 650.0000 mg | Freq: Four times a day (QID) | RECTAL | Status: DC | PRN

## 2018-12-28 NOTE — Progress Notes (Signed)
Patient is stable this shift,had a bleeding scan,had x2 bloody stools,denies pain,vital signs within normal limits

## 2018-12-28 NOTE — Progress Notes (Signed)
MD notified of post transfusion H&H. No new orders. Will continue to monitor.

## 2018-12-28 NOTE — Consult Note (Signed)
[facility] GI Inpatient Consult Note   Kathline Magic, M.D.  Reason for Consult: Anemia, lower GI bleed.   Attending Requesting Consult: Max Sane, M.D.   History of Present Illness: [name] [name] is a 78 y.o. female with a history of a small cell lung cancer currently stable who previously did not tolerate chemotherapy who presented to the emergency room yesterday morning with complaints of "blood in my urine and stool".  Patient denies any abdominal pain, fever, change in bowel habits.  She takes Plavix for history of peripheral vascular disease, stroke.  Patient denies any upper GI symptoms of nausea, vomiting, dysphagia, heartburn, regurgitation, anorexia.  Patient is nave to both endoscopy and colonoscopy.  Patient denies a family history of colon cancer. Patient has not taken Plavix in over 48 hours.  tRBC scan showed "mild questionable" activity in the rectosigmoid region. An active bleed could not be excluded.  Past Medical History:  Past Medical History:  Diagnosis Date  . Abnormal CT lung screening 10/17/2015  . COPD (chronic obstructive pulmonary disease) ([place])   . Coronary artery disease, non-occlusive    a. cath 2006: min nonobs CAD; b. cath 12/2010: cath LAD 50%, RCA 60%; c. 08/2013: Minimal luminal irregs, right dominant system with no significant CAD, diffuse luminal irregs noted. Normal EF 55%, no AS or MS.   . Diabetes mellitus   . Hyperlipemia    Followed by Dr. Rosanna Randy  . Hypertension   . Lung cancer ([place])   . Macular degeneration    rt  . Personal history of tobacco use, presenting hazards to health 10/15/2015  . Pneumonia    hx  . Shortness of breath   . Stroke [facility])     Problem List: Patient Active Problem List   Diagnosis Date Noted  . GI bleed 12/28/2018  . Syncope 12/27/2018  . Blood per rectum 12/27/2018  . Constipation 12/27/2018  . Chest pain 09/23/2018  . Stroke (cerebrum) ([place]) 05/06/2018  . Malnutrition of moderate degree  03/31/2017  . Acute respiratory failure with hypoxia ([place]) 03/30/2017  . AKI (acute kidney injury) ([place]) 07/15/2016  . Protein-calorie malnutrition, severe 02/08/2016  . Primary cancer of right upper lobe of lung ([place]) 11/20/2015  . Abnormal CT lung screening 10/17/2015  . Personal history of tobacco use, presenting hazards to health 10/15/2015  . Chronic vulvitis 09/26/2014  . Allergic reaction 09/26/2014  . Carotid stenosis 08/30/2014  . Cervical nerve root disorder 08/10/2014  . CAD in native artery 08/10/2014  . B12 deficiency 08/10/2014  . Back ache 08/10/2014  . Bronchitis, chronic ([place]) 08/10/2014  . Diabetes mellitus with polyneuropathy ([place]) 08/10/2014  . Can't get food down 08/10/2014  . Eczema of external ear 08/10/2014  . Accumulation of fluid in tissues 08/10/2014  . Gout 08/10/2014  . Adult hypothyroidism 08/10/2014  . Mononeuritis 08/10/2014  . Muscle ache 08/10/2014  . Disorder of peripheral nervous system 08/10/2014  . Lesion of vulva 08/10/2014  . Cervical spondylosis with radiculopathy 10/19/2013  . COPD exacerbation ([place]) 03/29/2013  . CAD (coronary artery disease) 06/22/2011  . COPD (chronic obstructive pulmonary disease) with emphysema ([place]) 03/07/2010  . CHEST PAIN UNSPECIFIED 07/22/2009  . HLD (hyperlipidemia) 04/01/2009  . Malaise and fatigue 04/01/2009  . TOBACCO ABUSE 01/18/2009  . HYPERTENSION, BENIGN 01/18/2009  . CLAUDICATION 01/18/2009  . Pain in limb 01/18/2009  . CAFL (chronic airflow limitation) ([place]) 01/20/2007  . Late effects of cerebrovascular disease 01/10/2007  . Essential (primary) hypertension 12/22/2006  Past Surgical History: Past Surgical History:  Procedure Laterality Date  . ABDOMINAL HYSTERECTOMY    . ANTERIOR CERVICAL DECOMP/DISCECTOMY FUSION N/A 10/19/2013   Procedure: CERVICAL FIVE-SIX ANTERIOR CERVICAL DECOMPRESSION WITH FUSION INTERBODY PROSTHESIS PLATING AND PEEK CAGE;  Surgeon: Ophelia Charter, MD;  Location: [place]  NEURO ORS;  Service: Neurosurgery;  Laterality: N/A;  . BACK SURGERY  80's  . BREAST CYST EXCISION Left    left negative   . CARDIAC CATHETERIZATION  05/2004  . CATARACT EXTRACTION Left   . CHOLECYSTECTOMY    . ENDARTERECTOMY Left 10/24/2014   Procedure: ENDARTERECTOMY CAROTID;  Surgeon: Algernon Huxley, MD;  Location: [facility] ORS;  Service: Vascular;  Laterality: Left;  . ENDOBRONCHIAL ULTRASOUND N/A 11/14/2015   Procedure: ENDOBRONCHIAL ULTRASOUND;  Surgeon: [name], MD;  Location: [facility] ORS;  Service: Cardiopulmonary;  Laterality: N/A;  . PERIPHERAL VASCULAR CATHETERIZATION N/A 12/04/2015   Procedure: Glori Luis Cath Insertion;  Surgeon: Algernon Huxley, MD;  Location: [place] CV LAB;  Service: Cardiovascular;  Laterality: N/A;  . PORTA CATH REMOVAL N/A 05/17/2017   Procedure: PORTA CATH REMOVAL;  Surgeon: Algernon Huxley, MD;  Location: [place] CV LAB;  Service: Cardiovascular;  Laterality: N/A;  . TONSILLECTOMY AND ADENOIDECTOMY    . VESICOVAGINAL FISTULA CLOSURE W/ TAH      Allergies: Allergies  Allergen Reactions  . Coconut Fatty Acids Swelling and Other (See Comments)    Throat swells    Home Medications: Medications Prior to Admission  Medication Sig Dispense Refill Last Dose  . amLODipine (NORVASC) 5 MG tablet Take 5 mg by mouth daily.    12/27/2018 at 0930  . aspirin EC 81 MG EC tablet Take 1 tablet (81 mg total) by mouth daily. 30 tablet 0 12/27/2018 at 0930  . atorvastatin (LIPITOR) 40 MG tablet Take 1 tablet (40 mg total) by mouth daily at 6 PM. 90 tablet 3 12/26/2018 at 1800  . clopidogrel (PLAVIX) 75 MG tablet TAKE 1 TABLET BY MOUTH EVERY DAY (Patient taking differently: Take 75 mg by mouth daily. ) 90 tablet 1 12/27/2018 at 0930  . gabapentin (NEURONTIN) 600 MG tablet Take 0.5 tablets (300 mg total) by mouth 2 (two) times daily. 180 tablet 3 12/27/2018 at 0930  . glimepiride (AMARYL) 2 MG tablet Take 1 tablet (2 mg total) by mouth 2 (two) times daily. 180 tablet 3 12/27/2018 at  0930  . isosorbide mononitrate (IMDUR) 30 MG 24 hr tablet TAKE 1 TABLET BY MOUTH  DAILY (Patient taking differently: Take 30 mg by mouth daily. ) 90 tablet 3 12/27/2018 at 0930  . magnesium oxide (MAG-OX) 400 (241.3 MG) MG tablet Take 400 mg by mouth 2 (two) times daily.    12/27/2018 at 0930  . metFORMIN (GLUCOPHAGE) 500 MG tablet Take 1 tablet (500 mg total) by mouth 2 (two) times daily with a meal. (Patient taking differently: Take 500 mg by mouth 2 (two) times daily with a meal. Can only tolerate once daily with meal) 180 tablet 3 12/27/2018 at 0930  . metoprolol succinate (TOPROL-XL) 100 MG 24 hr tablet Take 100 mg by mouth daily.   12/27/2018 at 0930  . Multiple Vitamin (MULTIVITAMIN) tablet Take 0.5 tablets by mouth 2 (two) times daily.   12/27/2018 at 0930  . Multiple Vitamins-Minerals (ICAPS) CAPS Take 1 capsule by mouth 2 (two) times daily.    12/27/2018 at 0930  . pantoprazole (PROTONIX) 40 MG tablet Take 1 tablet (40 mg total) by mouth daily. 90 tablet 3 12/27/2018  at 0930  . sucralfate (CARAFATE) 1 g tablet Take 1 g by mouth 2 (two) times daily.    12/27/2018 at 0930  . vitamin E (VITAMIN E) 400 UNIT capsule Take 400 Units by mouth daily.   12/27/2018 at 0930  . albuterol (VENTOLIN HFA) 108 (90 Base) MCG/ACT inhaler Inhale 2 puffs into the lungs every 4 (four) hours as needed for wheezing or shortness of breath. 8 g 11 prn at prn  . budesonide-formoterol (SYMBICORT) 80-4.5 MCG/ACT inhaler Inhale 2 puffs into the lungs 2 (two) times daily. 1 Inhaler 2 prn at prn  . fluticasone (FLONASE) 50 MCG/ACT nasal spray Place 2 sprays into both nostrils daily. 16 g 6 prn at prn  . LORazepam (ATIVAN) 1 MG tablet Take 1 tablet (1 mg total) by mouth 2 (two) times daily as needed for anxiety. 30 tablet 5 prn at prn  . mometasone (ELOCON) 0.1 % lotion PLACE 4 DROPS INTO EACH EAR CANAL AT BEDTIME AS NEEDED FOR DRY SKIN/ITCHING/DISCOMFORT.   prn at prn  . nitroGLYCERIN (NITROSTAT) 0.4 MG SL tablet Place 1 tablet  (0.4 mg total) under the tongue every 5 (five) minutes as needed for chest pain. 25 tablet 3 prn at prn  . ONETOUCH ULTRA test strip USE TO CHECK BLOOD SUGAR ONCE DAILY 50 strip 23    Home medication reconciliation was completed with the patient.   Scheduled Inpatient Medications:   . sodium chloride   Intravenous Once  . atorvastatin  40 mg Oral q1800  . [START ON 12/29/2018] influenza vaccine adjuvanted  0.5 mL Intramuscular Tomorrow-1000  . mometasone-formoterol  2 puff Inhalation BID  . [START ON 12/31/2018] pantoprazole  40 mg Intravenous Q12H  . polyethylene glycol-electrolytes  4,000 mL Oral Once    Continuous Inpatient Infusions:   . sodium chloride    . pantoprozole (PROTONIX) infusion 8 mg/hr (12/28/18 1648)    PRN Inpatient Medications:  acetaminophen **OR** acetaminophen, ondansetron **OR** ondansetron (ZOFRAN) IV  Family History: family history includes Heart attack in her brother, brother, father, and paternal grandmother; Lung cancer in her maternal grandfather; Stroke in her mother.   GI Family History: Negative  Social History:   reports that she quit smoking about 3 years ago. Her smoking use included cigarettes. She has a 50.00 pack-year smoking history. She has never used smokeless tobacco. She reports that she does not drink alcohol or use drugs. The patient denies ETOH, tobacco, or drug use.    Review of Systems: Review of Systems - General ROS: positive for  - fatigue negative for - fever, night sweats or weight loss Psychological ROS: negative Allergy and Immunology ROS: negative Hematological and Lymphatic ROS: positive for - blood clots and fatigue negative for - bruising, jaundice or swollen lymph nodes Endocrine ROS: positive for - skin changes negative for - temperature intolerance or unexpected weight changes Respiratory ROS: positive for - cough negative for - pleuritic pain, shortness of breath, sputum changes or wheezing Cardiovascular ROS: no  chest pain or dyspnea on exertion Gastrointestinal ROS: See HPI Genito-Urinary ROS: positive for - hematuria negative for - incontinence, pelvic pain or urinary frequency/urgency Musculoskeletal ROS: negative Neurological ROS: no TIA or stroke symptoms Dermatological ROS: negative  Physical Examination: BP 111/65 (BP Location: Right Arm)   Pulse 71   Temp 98.8 F (37.1 C) (Oral)   Resp 18   Ht 5\' 4"  (1.626 m)   Wt 68.5 kg   SpO2 96%   BMI 25.92 kg/m  Physical Exam Vitals  signs reviewed.  Constitutional:      General: She is not in acute distress.    Appearance: Normal appearance. She is not ill-appearing or toxic-appearing.  HENT:     Head: Normocephalic and atraumatic.     Nose: Nose normal.     Mouth/Throat:     Mouth: Mucous membranes are moist.     Pharynx: Oropharynx is clear.  Eyes:     Extraocular Movements: Extraocular movements intact.     Conjunctiva/sclera: Conjunctivae normal.     Pupils: Pupils are equal, round, and reactive to light.  Neck:     Musculoskeletal: Neck supple.  Cardiovascular:     Rate and Rhythm: Normal rate.     Pulses: Normal pulses.  Pulmonary:     Effort: Pulmonary effort is normal.     Breath sounds: No stridor. No rhonchi.  Chest:     Chest wall: No tenderness.  Abdominal:     General: Bowel sounds are normal. There is no distension.     Palpations: Abdomen is soft. There is no mass.     Tenderness: There is no abdominal tenderness. There is no guarding or rebound.  Musculoskeletal:     Right lower leg: Edema present.     Left lower leg: Edema present.  Lymphadenopathy:     Cervical: No cervical adenopathy.  Skin:    General: Skin is warm and dry.  Neurological:     Mental Status: She is alert and oriented to person, place, and time.     Comments: Aphasic speech, mild memory difficulty  Psychiatric:        Mood and Affect: Mood normal.        Thought Content: Thought content normal.     Data: Lab Results  Component  Value Date   WBC 8.0 12/28/2018   HGB 9.8 (L) 12/28/2018   HCT 30.2 (L) 12/28/2018   MCV 95.3 12/28/2018   PLT 167 12/28/2018   Recent Labs  Lab 12/27/18 1801 12/28/18 0421  HGB 10.9* 9.8*   Lab Results  Component Value Date   NA 138 12/28/2018   K 5.5 (H) 12/28/2018   CL 101 12/28/2018   CO2 27 12/28/2018   BUN 28 (H) 12/28/2018   CREATININE 1.41 (H) 12/28/2018   GLU 231 12/22/2016   Lab Results  Component Value Date   ALT 19 12/27/2018   AST 19 12/27/2018   ALKPHOS 62 12/27/2018   BILITOT 0.6 12/27/2018   Recent Labs  Lab 12/27/18 1802  APTT 28  INR 0.9   CBC Latest Ref Rng & Units 12/28/2018 12/27/2018 09/22/2018  WBC 4.0 - 10.5 K/uL 8.0 9.4 7.0  Hemoglobin 12.0 - 15.0 g/dL 9.8(L) 10.9(L) 10.4(L)  Hematocrit 36.0 - 46.0 % 30.2(L) 33.9(L) 32.8(L)  Platelets 150 - 400 K/uL 167 251 284    STUDIES: Nm Gi Blood Loss  Result Date: 12/28/2018 CLINICAL DATA:  GI bleed. EXAM: NUCLEAR MEDICINE GASTROINTESTINAL BLEEDING SCAN TECHNIQUE: Sequential abdominal images were obtained following intravenous administration of Tc-53m labeled red blood cells. RADIOPHARMACEUTICALS:  22.3 mCi Tc-71m pertechnetate in-vitro labeled red cells. COMPARISON:  CT 12/27/2018. FINDINGS: Flow and static images obtained. Static images obtained to 2 hours. Mild question increased activity noted over the rectosigmoid region. A rectosigmoid bleed cannot be excluded. IMPRESSION: Mild questionable increased activity noted in the rectosigmoid. Rectosigmoid bleed cannot be excluded. Electronically Signed   By: Marcello Moores  Register   On: 12/28/2018 12:03   Ct Angio Abd/pel W/ And/or W/o  Result Date: 12/27/2018  CLINICAL DATA:  Bright red blood per rectum. EXAM: CTA ABDOMEN AND PELVIS wITHOUT AND WITH CONTRAST TECHNIQUE: Multidetector CT imaging of the abdomen and pelvis was performed using the standard protocol during bolus administration of intravenous contrast. Multiplanar reconstructed images and MIPs were  obtained and reviewed to evaluate the vascular anatomy. CONTRAST:  18mL OMNIPAQUE IOHEXOL 350 MG/ML SOLN COMPARISON:  PET-CT 03/17/2016 FINDINGS: VASCULAR Aorta: Moderate calcified atheromatous plaque throughout. Moderate nonocclusive mural thrombus in the infrarenal segment. No aneurysm, dissection, or stenosis. Celiac: Patent without evidence of aneurysm, dissection, vasculitis or significant stenosis. SMA: Patent without evidence of aneurysm, dissection, vasculitis or significant stenosis. Classic distal branch anatomy. Renals: Single left, with ostial plaque resulting in short segment stenosis of at least mild severity. Single right, patent IMA: Patent without evidence of aneurysm, dissection, vasculitis or significant stenosis. Inflow: On the right, calcified plaque and thrombus in the proximal common iliac resulting in short-segment stenosis of at least mild severity. There is a tandem mild stenosis at the common iliac bifurcation involving the internal iliac origin. Eccentric plaque through the external iliac without high-grade stenosis. On the left, eccentric calcified plaque through the iliac arterial system without high-grade stenosis or aneurysm. Proximal Outflow: Common femoral arteries patent. Short-segment stenosis in the proximal right SFA of possible hemodynamic significance. Veins: Patent hepatic veins, portal vein, SMV, splenic vein, bilateral renal veins, IVC. No venous pathology is identified. Review of the MIP images confirms the above findings. NON-VASCULAR Lower chest: No acute abnormality. Hepatobiliary: No focal liver abnormality is seen. Status post cholecystectomy. No biliary dilatation. Pancreas: Unremarkable. No pancreatic ductal dilatation or surrounding inflammatory changes. Spleen: Normal in size without focal abnormality. Adrenals/Urinary Tract: 2.6 cm adrenal adenoma, stable. Kidneys unremarkable. Urinary bladder nondistended. Stomach/Bowel: Small hiatal hernia. Hyperdense material  within the gastric lumen. Stomach is nondistended. Small bowel decompressed. Normal appendix. Moderate colonic fecal material without dilatation. No focal wall thickening. Scattered distal descending and sigmoid diverticula without adjacent inflammatory/edematous change or abscess. No intraluminal extravasation identified on arterial phase or delayed scans. Lymphatic: No abdominal or pelvic adenopathy. Reproductive: Status post hysterectomy. No adnexal masses. Other: No ascites. Bilateral pelvic phleboliths. No free air. Musculoskeletal: Small umbilical hernia containing only mesenteric fat. Mild spondylitic changes in the lower lumbar spine. No fracture or worrisome bone lesion. IMPRESSION: 1. No evidence of active GI bleeding at this time. 2. Distal descending and sigmoid diverticulosis. 3. Small hiatal hernia. 4. Stable left adrenal adenoma. 5. Right iliac and SFA stenoses of possible hemodynamic significance. Correlate with clinical symptomatology and ABIs. Electronically Signed   By: Lucrezia Europe M.D.   On: 12/27/2018 19:46   @IMAGES @  Assessment:  1. Lower GI bleed - DDx ischemic colitis (usually painless), diverticular bleeding, colon malignancy, infectious colitis. No si/sx of potential UGI source.   2. Anemia - currently mild, posthemorrhagic anemia. H/H stable.  3. Abnormal tRBC scan showing possible Rectosigmoid source of blood loss.   4. Chronic anticoagulation - On Plavix (held x 2 days).  5. Peripheral vascular disease/Carotid stenosis - s/p endarterectomy by Dr. Lucky Cowboy.   6. COPD  7. Hx of Tobacco abuse.  8. Hx of Small cell CA of lung - Followed by Dr. [name].   COVID-19 status: Tested negative     Recommendations:  1. Serial H/H.   2. Clear liquid diet, ordered earlier today.  3. Blood transfusions as necessary.   4. Colonoscopy in AM. The patient understands the nature of the planned procedure, indications, risks, alternatives and potential complications including but  not limited to bleeding, infection, perforation, damage to internal organs and possible oversedation/side effects from anesthesia. The patient agrees and gives consent to proceed.  Please refer to procedure notes for findings, recommendations and patient disposition/instructions.   Thank you for the consult. Please call with questions or concerns.  Olean Ree, "Lanny Hurst MD [facility] [place], [place] [address] [phone]  12/28/2018 5:08 PM

## 2018-12-28 NOTE — H&P (View-Only) (Signed)
[facility] GI Inpatient Consult Note   Kathline Magic, M.D.  Reason for Consult: Anemia, lower GI bleed.   Attending Requesting Consult: Max Sane, M.D.   History of Present Illness: [name] [name] is a 78 y.o. female with a history of a small cell lung cancer currently stable who previously did not tolerate chemotherapy who presented to the emergency room yesterday morning with complaints of "blood in my urine and stool".  Patient denies any abdominal pain, fever, change in bowel habits.  She takes Plavix for history of peripheral vascular disease, stroke.  Patient denies any upper GI symptoms of nausea, vomiting, dysphagia, heartburn, regurgitation, anorexia.  Patient is nave to both endoscopy and colonoscopy.  Patient denies a family history of colon cancer. Patient has not taken Plavix in over 48 hours.  tRBC scan showed "mild questionable" activity in the rectosigmoid region. An active bleed could not be excluded.  Past Medical History:  Past Medical History:  Diagnosis Date  . Abnormal CT lung screening 10/17/2015  . COPD (chronic obstructive pulmonary disease) ([place])   . Coronary artery disease, non-occlusive    a. cath 2006: min nonobs CAD; b. cath 12/2010: cath LAD 50%, RCA 60%; c. 08/2013: Minimal luminal irregs, right dominant system with no significant CAD, diffuse luminal irregs noted. Normal EF 55%, no AS or MS.   . Diabetes mellitus   . Hyperlipemia    Followed by Dr. Rosanna Randy  . Hypertension   . Lung cancer ([place])   . Macular degeneration    rt  . Personal history of tobacco use, presenting hazards to health 10/15/2015  . Pneumonia    hx  . Shortness of breath   . Stroke [facility])     Problem List: Patient Active Problem List   Diagnosis Date Noted  . GI bleed 12/28/2018  . Syncope 12/27/2018  . Blood per rectum 12/27/2018  . Constipation 12/27/2018  . Chest pain 09/23/2018  . Stroke (cerebrum) ([place]) 05/06/2018  . Malnutrition of moderate degree  03/31/2017  . Acute respiratory failure with hypoxia ([place]) 03/30/2017  . AKI (acute kidney injury) ([place]) 07/15/2016  . Protein-calorie malnutrition, severe 02/08/2016  . Primary cancer of right upper lobe of lung ([place]) 11/20/2015  . Abnormal CT lung screening 10/17/2015  . Personal history of tobacco use, presenting hazards to health 10/15/2015  . Chronic vulvitis 09/26/2014  . Allergic reaction 09/26/2014  . Carotid stenosis 08/30/2014  . Cervical nerve root disorder 08/10/2014  . CAD in native artery 08/10/2014  . B12 deficiency 08/10/2014  . Back ache 08/10/2014  . Bronchitis, chronic ([place]) 08/10/2014  . Diabetes mellitus with polyneuropathy ([place]) 08/10/2014  . Can't get food down 08/10/2014  . Eczema of external ear 08/10/2014  . Accumulation of fluid in tissues 08/10/2014  . Gout 08/10/2014  . Adult hypothyroidism 08/10/2014  . Mononeuritis 08/10/2014  . Muscle ache 08/10/2014  . Disorder of peripheral nervous system 08/10/2014  . Lesion of vulva 08/10/2014  . Cervical spondylosis with radiculopathy 10/19/2013  . COPD exacerbation ([place]) 03/29/2013  . CAD (coronary artery disease) 06/22/2011  . COPD (chronic obstructive pulmonary disease) with emphysema ([place]) 03/07/2010  . CHEST PAIN UNSPECIFIED 07/22/2009  . HLD (hyperlipidemia) 04/01/2009  . Malaise and fatigue 04/01/2009  . TOBACCO ABUSE 01/18/2009  . HYPERTENSION, BENIGN 01/18/2009  . CLAUDICATION 01/18/2009  . Pain in limb 01/18/2009  . CAFL (chronic airflow limitation) ([place]) 01/20/2007  . Late effects of cerebrovascular disease 01/10/2007  . Essential (primary) hypertension 12/22/2006  Past Surgical History: Past Surgical History:  Procedure Laterality Date  . ABDOMINAL HYSTERECTOMY    . ANTERIOR CERVICAL DECOMP/DISCECTOMY FUSION N/A 10/19/2013   Procedure: CERVICAL FIVE-SIX ANTERIOR CERVICAL DECOMPRESSION WITH FUSION INTERBODY PROSTHESIS PLATING AND PEEK CAGE;  Surgeon: Ophelia Charter, MD;  Location: [facility];  Service: Neurosurgery;  Laterality: N/A;  . BACK SURGERY  80's  . BREAST CYST EXCISION Left    left negative   . CARDIAC CATHETERIZATION  05/2004  . CATARACT EXTRACTION Left   . CHOLECYSTECTOMY    . ENDARTERECTOMY Left 10/24/2014   Procedure: ENDARTERECTOMY CAROTID;  Surgeon: Algernon Huxley, MD;  Location: [facility] ORS;  Service: Vascular;  Laterality: Left;  . ENDOBRONCHIAL ULTRASOUND N/A 11/14/2015   Procedure: ENDOBRONCHIAL ULTRASOUND;  Surgeon: [name], MD;  Location: [facility] ORS;  Service: Cardiopulmonary;  Laterality: N/A;  . PERIPHERAL VASCULAR CATHETERIZATION N/A 12/04/2015   Procedure: Glori Luis Cath Insertion;  Surgeon: Algernon Huxley, MD;  Location: [facility];  Service: Cardiovascular;  Laterality: N/A;  . PORTA CATH REMOVAL N/A 05/17/2017   Procedure: PORTA CATH REMOVAL;  Surgeon: Algernon Huxley, MD;  Location: [facility];  Service: Cardiovascular;  Laterality: N/A;  . TONSILLECTOMY AND ADENOIDECTOMY    . VESICOVAGINAL FISTULA CLOSURE W/ TAH      Allergies: Allergies  Allergen Reactions  . Coconut Fatty Acids Swelling and Other (See Comments)    Throat swells    Home Medications: Medications Prior to Admission  Medication Sig Dispense Refill Last Dose  . amLODipine (NORVASC) 5 MG tablet Take 5 mg by mouth daily.    12/27/2018 at 0930  . aspirin EC 81 MG EC tablet Take 1 tablet (81 mg total) by mouth daily. 30 tablet 0 12/27/2018 at 0930  . atorvastatin (LIPITOR) 40 MG tablet Take 1 tablet (40 mg total) by mouth daily at 6 PM. 90 tablet 3 12/26/2018 at 1800  . clopidogrel (PLAVIX) 75 MG tablet TAKE 1 TABLET BY MOUTH EVERY DAY (Patient taking differently: Take 75 mg by mouth daily. ) 90 tablet 1 12/27/2018 at 0930  . gabapentin (NEURONTIN) 600 MG tablet Take 0.5 tablets (300 mg total) by mouth 2 (two) times daily. 180 tablet 3 12/27/2018 at 0930  . glimepiride (AMARYL) 2 MG tablet Take 1 tablet (2 mg total) by mouth 2 (two) times daily. 180 tablet 3 12/27/2018 at  0930  . isosorbide mononitrate (IMDUR) 30 MG 24 hr tablet TAKE 1 TABLET BY MOUTH  DAILY (Patient taking differently: Take 30 mg by mouth daily. ) 90 tablet 3 12/27/2018 at 0930  . magnesium oxide (MAG-OX) 400 (241.3 MG) MG tablet Take 400 mg by mouth 2 (two) times daily.    12/27/2018 at 0930  . metFORMIN (GLUCOPHAGE) 500 MG tablet Take 1 tablet (500 mg total) by mouth 2 (two) times daily with a meal. (Patient taking differently: Take 500 mg by mouth 2 (two) times daily with a meal. Can only tolerate once daily with meal) 180 tablet 3 12/27/2018 at 0930  . metoprolol succinate (TOPROL-XL) 100 MG 24 hr tablet Take 100 mg by mouth daily.   12/27/2018 at 0930  . Multiple Vitamin (MULTIVITAMIN) tablet Take 0.5 tablets by mouth 2 (two) times daily.   12/27/2018 at 0930  . Multiple Vitamins-Minerals (ICAPS) CAPS Take 1 capsule by mouth 2 (two) times daily.    12/27/2018 at 0930  . pantoprazole (PROTONIX) 40 MG tablet Take 1 tablet (40 mg total) by mouth daily. 90 tablet 3 12/27/2018  at 0930  . sucralfate (CARAFATE) 1 g tablet Take 1 g by mouth 2 (two) times daily.    12/27/2018 at 0930  . vitamin E (VITAMIN E) 400 UNIT capsule Take 400 Units by mouth daily.   12/27/2018 at 0930  . albuterol (VENTOLIN HFA) 108 (90 Base) MCG/ACT inhaler Inhale 2 puffs into the lungs every 4 (four) hours as needed for wheezing or shortness of breath. 8 g 11 prn at prn  . budesonide-formoterol (SYMBICORT) 80-4.5 MCG/ACT inhaler Inhale 2 puffs into the lungs 2 (two) times daily. 1 Inhaler 2 prn at prn  . fluticasone (FLONASE) 50 MCG/ACT nasal spray Place 2 sprays into both nostrils daily. 16 g 6 prn at prn  . LORazepam (ATIVAN) 1 MG tablet Take 1 tablet (1 mg total) by mouth 2 (two) times daily as needed for anxiety. 30 tablet 5 prn at prn  . mometasone (ELOCON) 0.1 % lotion PLACE 4 DROPS INTO EACH EAR CANAL AT BEDTIME AS NEEDED FOR DRY SKIN/ITCHING/DISCOMFORT.   prn at prn  . nitroGLYCERIN (NITROSTAT) 0.4 MG SL tablet Place 1 tablet  (0.4 mg total) under the tongue every 5 (five) minutes as needed for chest pain. 25 tablet 3 prn at prn  . ONETOUCH ULTRA test strip USE TO CHECK BLOOD SUGAR ONCE DAILY 50 strip 23    Home medication reconciliation was completed with the patient.   Scheduled Inpatient Medications:   . sodium chloride   Intravenous Once  . atorvastatin  40 mg Oral q1800  . [START ON 12/29/2018] influenza vaccine adjuvanted  0.5 mL Intramuscular Tomorrow-1000  . mometasone-formoterol  2 puff Inhalation BID  . [START ON 12/31/2018] pantoprazole  40 mg Intravenous Q12H  . polyethylene glycol-electrolytes  4,000 mL Oral Once    Continuous Inpatient Infusions:   . sodium chloride    . pantoprozole (PROTONIX) infusion 8 mg/hr (12/28/18 1648)    PRN Inpatient Medications:  acetaminophen **OR** acetaminophen, ondansetron **OR** ondansetron (ZOFRAN) IV  Family History: family history includes Heart attack in her brother, brother, father, and paternal grandmother; Lung cancer in her maternal grandfather; Stroke in her mother.   GI Family History: Negative  Social History:   reports that she quit smoking about 3 years ago. Her smoking use included cigarettes. She has a 50.00 pack-year smoking history. She has never used smokeless tobacco. She reports that she does not drink alcohol or use drugs. The patient denies ETOH, tobacco, or drug use.    Review of Systems: Review of Systems - General ROS: positive for  - fatigue negative for - fever, night sweats or weight loss Psychological ROS: negative Allergy and Immunology ROS: negative Hematological and Lymphatic ROS: positive for - blood clots and fatigue negative for - bruising, jaundice or swollen lymph nodes Endocrine ROS: positive for - skin changes negative for - temperature intolerance or unexpected weight changes Respiratory ROS: positive for - cough negative for - pleuritic pain, shortness of breath, sputum changes or wheezing Cardiovascular ROS: no  chest pain or dyspnea on exertion Gastrointestinal ROS: See HPI Genito-Urinary ROS: positive for - hematuria negative for - incontinence, pelvic pain or urinary frequency/urgency Musculoskeletal ROS: negative Neurological ROS: no TIA or stroke symptoms Dermatological ROS: negative  Physical Examination: BP 111/65 (BP Location: Right Arm)   Pulse 71   Temp 98.8 F (37.1 C) (Oral)   Resp 18   Ht 5\' 4"  (1.626 m)   Wt 68.5 kg   SpO2 96%   BMI 25.92 kg/m  Physical Exam Vitals  signs reviewed.  Constitutional:      General: She is not in acute distress.    Appearance: Normal appearance. She is not ill-appearing or toxic-appearing.  HENT:     Head: Normocephalic and atraumatic.     Nose: Nose normal.     Mouth/Throat:     Mouth: Mucous membranes are moist.     Pharynx: Oropharynx is clear.  Eyes:     Extraocular Movements: Extraocular movements intact.     Conjunctiva/sclera: Conjunctivae normal.     Pupils: Pupils are equal, round, and reactive to light.  Neck:     Musculoskeletal: Neck supple.  Cardiovascular:     Rate and Rhythm: Normal rate.     Pulses: Normal pulses.  Pulmonary:     Effort: Pulmonary effort is normal.     Breath sounds: No stridor. No rhonchi.  Chest:     Chest wall: No tenderness.  Abdominal:     General: Bowel sounds are normal. There is no distension.     Palpations: Abdomen is soft. There is no mass.     Tenderness: There is no abdominal tenderness. There is no guarding or rebound.  Musculoskeletal:     Right lower leg: Edema present.     Left lower leg: Edema present.  Lymphadenopathy:     Cervical: No cervical adenopathy.  Skin:    General: Skin is warm and dry.  Neurological:     Mental Status: She is alert and oriented to person, place, and time.     Comments: Aphasic speech, mild memory difficulty  Psychiatric:        Mood and Affect: Mood normal.        Thought Content: Thought content normal.     Data: Lab Results  Component  Value Date   WBC 8.0 12/28/2018   HGB 9.8 (L) 12/28/2018   HCT 30.2 (L) 12/28/2018   MCV 95.3 12/28/2018   PLT 167 12/28/2018   Recent Labs  Lab 12/27/18 1801 12/28/18 0421  HGB 10.9* 9.8*   Lab Results  Component Value Date   NA 138 12/28/2018   K 5.5 (H) 12/28/2018   CL 101 12/28/2018   CO2 27 12/28/2018   BUN 28 (H) 12/28/2018   CREATININE 1.41 (H) 12/28/2018   GLU 231 12/22/2016   Lab Results  Component Value Date   ALT 19 12/27/2018   AST 19 12/27/2018   ALKPHOS 62 12/27/2018   BILITOT 0.6 12/27/2018   Recent Labs  Lab 12/27/18 1802  APTT 28  INR 0.9   CBC Latest Ref Rng & Units 12/28/2018 12/27/2018 09/22/2018  WBC 4.0 - 10.5 K/uL 8.0 9.4 7.0  Hemoglobin 12.0 - 15.0 g/dL 9.8(L) 10.9(L) 10.4(L)  Hematocrit 36.0 - 46.0 % 30.2(L) 33.9(L) 32.8(L)  Platelets 150 - 400 K/uL 167 251 284    STUDIES: Nm Gi Blood Loss  Result Date: 12/28/2018 CLINICAL DATA:  GI bleed. EXAM: NUCLEAR MEDICINE GASTROINTESTINAL BLEEDING SCAN TECHNIQUE: Sequential abdominal images were obtained following intravenous administration of Tc-65m labeled red blood cells. RADIOPHARMACEUTICALS:  22.3 mCi Tc-42m pertechnetate in-vitro labeled red cells. COMPARISON:  CT 12/27/2018. FINDINGS: Flow and static images obtained. Static images obtained to 2 hours. Mild question increased activity noted over the rectosigmoid region. A rectosigmoid bleed cannot be excluded. IMPRESSION: Mild questionable increased activity noted in the rectosigmoid. Rectosigmoid bleed cannot be excluded. Electronically Signed   By: Marcello Moores  Register   On: 12/28/2018 12:03   Ct Angio Abd/pel W/ And/or W/o  Result Date: 12/27/2018  CLINICAL DATA:  Bright red blood per rectum. EXAM: CTA ABDOMEN AND PELVIS wITHOUT AND WITH CONTRAST TECHNIQUE: Multidetector CT imaging of the abdomen and pelvis was performed using the standard protocol during bolus administration of intravenous contrast. Multiplanar reconstructed images and MIPs were  obtained and reviewed to evaluate the vascular anatomy. CONTRAST:  82mL OMNIPAQUE IOHEXOL 350 MG/ML SOLN COMPARISON:  PET-CT 03/17/2016 FINDINGS: VASCULAR Aorta: Moderate calcified atheromatous plaque throughout. Moderate nonocclusive mural thrombus in the infrarenal segment. No aneurysm, dissection, or stenosis. Celiac: Patent without evidence of aneurysm, dissection, vasculitis or significant stenosis. SMA: Patent without evidence of aneurysm, dissection, vasculitis or significant stenosis. Classic distal branch anatomy. Renals: Single left, with ostial plaque resulting in short segment stenosis of at least mild severity. Single right, patent IMA: Patent without evidence of aneurysm, dissection, vasculitis or significant stenosis. Inflow: On the right, calcified plaque and thrombus in the proximal common iliac resulting in short-segment stenosis of at least mild severity. There is a tandem mild stenosis at the common iliac bifurcation involving the internal iliac origin. Eccentric plaque through the external iliac without high-grade stenosis. On the left, eccentric calcified plaque through the iliac arterial system without high-grade stenosis or aneurysm. Proximal Outflow: Common femoral arteries patent. Short-segment stenosis in the proximal right SFA of possible hemodynamic significance. Veins: Patent hepatic veins, portal vein, SMV, splenic vein, bilateral renal veins, IVC. No venous pathology is identified. Review of the MIP images confirms the above findings. NON-VASCULAR Lower chest: No acute abnormality. Hepatobiliary: No focal liver abnormality is seen. Status post cholecystectomy. No biliary dilatation. Pancreas: Unremarkable. No pancreatic ductal dilatation or surrounding inflammatory changes. Spleen: Normal in size without focal abnormality. Adrenals/Urinary Tract: 2.6 cm adrenal adenoma, stable. Kidneys unremarkable. Urinary bladder nondistended. Stomach/Bowel: Small hiatal hernia. Hyperdense material  within the gastric lumen. Stomach is nondistended. Small bowel decompressed. Normal appendix. Moderate colonic fecal material without dilatation. No focal wall thickening. Scattered distal descending and sigmoid diverticula without adjacent inflammatory/edematous change or abscess. No intraluminal extravasation identified on arterial phase or delayed scans. Lymphatic: No abdominal or pelvic adenopathy. Reproductive: Status post hysterectomy. No adnexal masses. Other: No ascites. Bilateral pelvic phleboliths. No free air. Musculoskeletal: Small umbilical hernia containing only mesenteric fat. Mild spondylitic changes in the lower lumbar spine. No fracture or worrisome bone lesion. IMPRESSION: 1. No evidence of active GI bleeding at this time. 2. Distal descending and sigmoid diverticulosis. 3. Small hiatal hernia. 4. Stable left adrenal adenoma. 5. Right iliac and SFA stenoses of possible hemodynamic significance. Correlate with clinical symptomatology and ABIs. Electronically Signed   By: Lucrezia Europe M.D.   On: 12/27/2018 19:46   @IMAGES @  Assessment:  1. Lower GI bleed - DDx ischemic colitis (usually painless), diverticular bleeding, colon malignancy, infectious colitis. No si/sx of potential UGI source.   2. Anemia - currently mild, posthemorrhagic anemia. H/H stable.  3. Abnormal tRBC scan showing possible Rectosigmoid source of blood loss.   4. Chronic anticoagulation - On Plavix (held x 2 days).  5. Peripheral vascular disease/Carotid stenosis - s/p endarterectomy by Dr. Lucky Cowboy.   6. COPD  7. Hx of Tobacco abuse.  8. Hx of Small cell CA of lung - Followed by Dr. [name].   COVID-19 status: Tested negative     Recommendations:  1. Serial H/H.   2. Clear liquid diet, ordered earlier today.  3. Blood transfusions as necessary.   4. Colonoscopy in AM. The patient understands the nature of the planned procedure, indications, risks, alternatives and potential complications including but  not limited to bleeding, infection, perforation, damage to internal organs and possible oversedation/side effects from anesthesia. The patient agrees and gives consent to proceed.  Please refer to procedure notes for findings, recommendations and patient disposition/instructions.   Thank you for the consult. Please call with questions or concerns.  Olean Ree, "Lanny Hurst MD [facility] [place], [place] [address] [phone]  12/28/2018 5:08 PM

## 2018-12-28 NOTE — Progress Notes (Signed)
[place] [facility] [place] NAME: [name] [name]    MR#:  [id number]  DATE OF BIRTH:  1940/06/22  SUBJECTIVE:  CHIEF COMPLAINT:   Chief Complaint  Patient presents with  . Rectal Bleeding  continue to have rectal bleeding. Husband at bedside REVIEW OF SYSTEMS:  Review of Systems  Constitutional: Positive for malaise/fatigue. Negative for diaphoresis, fever and weight loss.  HENT: Negative for ear discharge, ear pain, hearing loss, nosebleeds, sore throat and tinnitus.   Eyes: Negative for blurred vision and pain.  Respiratory: Negative for cough, hemoptysis, shortness of breath and wheezing.   Cardiovascular: Negative for chest pain, palpitations, orthopnea and leg swelling.  Gastrointestinal: Positive for blood in stool. Negative for abdominal pain, constipation, diarrhea, heartburn, nausea and vomiting.  Genitourinary: Negative for dysuria, frequency and urgency.  Musculoskeletal: Negative for back pain and myalgias.  Skin: Negative for itching and rash.  Neurological: Negative for dizziness, tingling, tremors, focal weakness, seizures, weakness and headaches.  Psychiatric/Behavioral: Negative for depression. The patient is not nervous/anxious.     DRUG ALLERGIES:   Allergies  Allergen Reactions  . Coconut Fatty Acids Swelling and Other (See Comments)    Throat swells   VITALS:  Blood pressure 111/65, pulse 71, temperature 98.8 F (37.1 C), temperature source Oral, resp. rate 18, height 5\' 4"  (1.626 m), weight 68.5 kg, SpO2 96 %. PHYSICAL EXAMINATION:  Physical Exam HENT:     Head: Normocephalic and atraumatic.  Eyes:     Conjunctiva/sclera: Conjunctivae normal.     Pupils: Pupils are equal, round, and reactive to light.  Neck:     Musculoskeletal: Normal range of motion and neck supple.     Thyroid: No thyromegaly.     Trachea: No tracheal deviation.  Cardiovascular:     Rate and Rhythm: Normal rate and regular rhythm.   Heart sounds: Normal heart sounds.  Pulmonary:     Effort: Pulmonary effort is normal. No respiratory distress.     Breath sounds: Normal breath sounds. No wheezing.  Chest:     Chest wall: No tenderness.  Abdominal:     General: Bowel sounds are normal. There is no distension.     Palpations: Abdomen is soft.     Tenderness: There is no abdominal tenderness.  Musculoskeletal: Normal range of motion.  Skin:    General: Skin is warm and dry.     Findings: No rash.  Neurological:     Mental Status: She is alert and oriented to person, place, and time.     Cranial Nerves: No cranial nerve deficit.    LABORATORY PANEL:  Female CBC Recent Labs  Lab 12/28/18 0421  WBC 8.0  HGB 9.8*  HCT 30.2*  PLT 167   ------------------------------------------------------------------------------------------------------------------ Chemistries  Recent Labs  Lab 12/27/18 1801 12/28/18 0421  NA 138 138  K 4.7 5.5*  CL 99 101  CO2 26 27  GLUCOSE 181* 203*  BUN 23 28*  CREATININE 1.51* 1.41*  CALCIUM 9.3 8.3*  AST 19  --   ALT 19  --   ALKPHOS 62  --   BILITOT 0.6  --    RADIOLOGY:  Nm Gi Blood Loss  Result Date: 12/28/2018 CLINICAL DATA:  GI bleed. EXAM: NUCLEAR MEDICINE GASTROINTESTINAL BLEEDING SCAN TECHNIQUE: Sequential abdominal images were obtained following intravenous administration of Tc-77m labeled red blood cells. RADIOPHARMACEUTICALS:  22.3 mCi Tc-35m pertechnetate in-vitro labeled red cells. COMPARISON:  CT 12/27/2018. FINDINGS: Flow and static images obtained. Static  images obtained to 2 hours. Mild question increased activity noted over the rectosigmoid region. A rectosigmoid bleed cannot be excluded. IMPRESSION: Mild questionable increased activity noted in the rectosigmoid. Rectosigmoid bleed cannot be excluded. Electronically Signed   By: Marcello Moores  Register   On: 12/28/2018 12:03   Ct Angio Abd/pel W/ And/or W/o  Result Date: 12/27/2018 CLINICAL DATA:  Bright red blood per  rectum. EXAM: CTA ABDOMEN AND PELVIS wITHOUT AND WITH CONTRAST TECHNIQUE: Multidetector CT imaging of the abdomen and pelvis was performed using the standard protocol during bolus administration of intravenous contrast. Multiplanar reconstructed images and MIPs were obtained and reviewed to evaluate the vascular anatomy. CONTRAST:  75mL OMNIPAQUE IOHEXOL 350 MG/ML SOLN COMPARISON:  PET-CT 03/17/2016 FINDINGS: VASCULAR Aorta: Moderate calcified atheromatous plaque throughout. Moderate nonocclusive mural thrombus in the infrarenal segment. No aneurysm, dissection, or stenosis. Celiac: Patent without evidence of aneurysm, dissection, vasculitis or significant stenosis. SMA: Patent without evidence of aneurysm, dissection, vasculitis or significant stenosis. Classic distal branch anatomy. Renals: Single left, with ostial plaque resulting in short segment stenosis of at least mild severity. Single right, patent IMA: Patent without evidence of aneurysm, dissection, vasculitis or significant stenosis. Inflow: On the right, calcified plaque and thrombus in the proximal common iliac resulting in short-segment stenosis of at least mild severity. There is a tandem mild stenosis at the common iliac bifurcation involving the internal iliac origin. Eccentric plaque through the external iliac without high-grade stenosis. On the left, eccentric calcified plaque through the iliac arterial system without high-grade stenosis or aneurysm. Proximal Outflow: Common femoral arteries patent. Short-segment stenosis in the proximal right SFA of possible hemodynamic significance. Veins: Patent hepatic veins, portal vein, SMV, splenic vein, bilateral renal veins, IVC. No venous pathology is identified. Review of the MIP images confirms the above findings. NON-VASCULAR Lower chest: No acute abnormality. Hepatobiliary: No focal liver abnormality is seen. Status post cholecystectomy. No biliary dilatation. Pancreas: Unremarkable. No pancreatic  ductal dilatation or surrounding inflammatory changes. Spleen: Normal in size without focal abnormality. Adrenals/Urinary Tract: 2.6 cm adrenal adenoma, stable. Kidneys unremarkable. Urinary bladder nondistended. Stomach/Bowel: Small hiatal hernia. Hyperdense material within the gastric lumen. Stomach is nondistended. Small bowel decompressed. Normal appendix. Moderate colonic fecal material without dilatation. No focal wall thickening. Scattered distal descending and sigmoid diverticula without adjacent inflammatory/edematous change or abscess. No intraluminal extravasation identified on arterial phase or delayed scans. Lymphatic: No abdominal or pelvic adenopathy. Reproductive: Status post hysterectomy. No adnexal masses. Other: No ascites. Bilateral pelvic phleboliths. No free air. Musculoskeletal: Small umbilical hernia containing only mesenteric fat. Mild spondylitic changes in the lower lumbar spine. No fracture or worrisome bone lesion. IMPRESSION: 1. No evidence of active GI bleeding at this time. 2. Distal descending and sigmoid diverticulosis. 3. Small hiatal hernia. 4. Stable left adrenal adenoma. 5. Right iliac and SFA stenoses of possible hemodynamic significance. Correlate with clinical symptomatology and ABIs. Electronically Signed   By: Lucrezia Europe M.D.   On: 12/27/2018 19:46   ASSESSMENT AND PLAN:  89 y f admitted with syncope and GI bleed  * Syncope -Suspected vasovagal. Will hold off repeating echo as she just had it at Dr Laurelyn Sickle office in last 1-2 weeks  * Rectal/GI bleed - bleeding scan not showing active bleed - does have increase activity in rectosigmoid area - d/w GI, plan for c-scope tomorrow. Monitor H & H for now - stop ASA, Plavix  * CAD (coronary artery disease) -hold asa & plavix. Continue other meds  * Constipation -PRN stool softener/laxative  *  HYPERTENSION, BENIGN -continue home dose antihypertensives   COPD (chronic obstructive pulmonary disease) with emphysema  ([place]) -continue home meds   HLD (hyperlipidemia) -home dose antilipid   Adult hypothyroidism -home dose thyroid replacement     All the records are reviewed and case discussed with Care Management/Social Worker. Management plans discussed with the patient, family (husband at bedside) and they are in agreement.  CODE STATUS: DNR  TOTAL TIME TAKING CARE OF THIS PATIENT: 25 minutes.   More than 50% of the time was spent in counseling/coordination of care: YES  POSSIBLE D/C IN 1 DAYS, DEPENDING ON CLINICAL CONDITION.   Max Sane M.D on 12/28/2018 at 5:20 PM  Between 7am to 6pm - Pager - [phone]  After 6pm go to [email or web address] - password [email or web address]  [facility] [place]  Office  [phone]  CC: Primary care physician; Jerrol Banana., MD  Note: This dictation was prepared with Dragon dictation along with smaller phrase technology. Any transcriptional errors that result from this process are unintentional.

## 2018-12-29 ENCOUNTER — Inpatient Hospital Stay: Payer: Medicare Other | Admitting: Anesthesiology

## 2018-12-29 ENCOUNTER — Encounter
Admission: EM | Disposition: A | Discharge status: Home / Self Care | Payer: Self-pay | Source: Home / Self Care | Attending: Internal Medicine

## 2018-12-29 HISTORY — PX: COLONOSCOPY: SHX5424

## 2018-12-29 LAB — PREPARE RBC (CROSSMATCH)

## 2018-12-29 LAB — BASIC METABOLIC PANEL
Anion gap: 9 (ref 5–15)
BUN: 26 mg/dL — ABNORMAL HIGH (ref 8–23)
CO2: 27 mmol/L (ref 22–32)
Calcium: 8 mg/dL — ABNORMAL LOW (ref 8.9–10.3)
Chloride: 105 mmol/L (ref 98–111)
Creatinine, Ser: 1.23 mg/dL — ABNORMAL HIGH (ref 0.44–1.00)
GFR calc Af Amer: 49 mL/min — ABNORMAL LOW (ref >60)
GFR calc non Af Amer: 43 mL/min — ABNORMAL LOW (ref >60)
Glucose, Bld: 122 mg/dL — ABNORMAL HIGH (ref 70–99)
Potassium: 4.1 mmol/L (ref 3.5–5.1)
Sodium: 141 mmol/L (ref 135–145)

## 2018-12-29 LAB — CBC
HCT: 21.5 % — ABNORMAL LOW (ref 36.0–46.0)
Hemoglobin: 7.2 g/dL — ABNORMAL LOW (ref 12.0–15.0)
MCH: 31.6 pg (ref 26.0–34.0)
MCHC: 33.5 g/dL (ref 30.0–36.0)
MCV: 94.3 fL (ref 80.0–100.0)
Platelets: 141 10*3/uL — ABNORMAL LOW (ref 150–400)
RBC: 2.28 MIL/uL — ABNORMAL LOW (ref 3.87–5.11)
RDW: 14.3 % (ref 11.5–15.5)
WBC: 6 10*3/uL (ref 4.0–10.5)
nRBC: 0 % (ref 0.0–0.2)

## 2018-12-29 SURGERY — COLONOSCOPY
Anesthesia: General

## 2018-12-29 MED ORDER — LIDOCAINE HCL (CARDIAC) PF 100 MG/5ML IV SOSY
PREFILLED_SYRINGE | INTRAVENOUS | Status: DC | PRN
  Administered 2018-12-29: 60 mg via INTRAVENOUS

## 2018-12-29 MED ORDER — SODIUM CHLORIDE 0.9% FLUSH
3.0000 mL | Freq: Two times a day (BID) | INTRAVENOUS | Status: DC
  Administered 2018-12-30 (×2): 3 mL via INTRAVENOUS

## 2018-12-29 MED ORDER — SODIUM CHLORIDE 0.9% FLUSH: 3.0000 mL | INTRAVENOUS | Status: DC | PRN

## 2018-12-29 MED ORDER — PROPOFOL 500 MG/50ML IV EMUL
INTRAVENOUS | Status: DC | PRN
  Administered 2018-12-29: 100 ug/kg/min via INTRAVENOUS

## 2018-12-29 MED ORDER — PROPOFOL 500 MG/50ML IV EMUL
INTRAVENOUS | Status: AC
  Filled 2018-12-29: qty 50

## 2018-12-29 MED ORDER — SODIUM CHLORIDE 0.9% IV SOLUTION: Freq: Once | INTRAVENOUS | Status: DC

## 2018-12-29 MED ORDER — LIDOCAINE HCL (PF) 2 % IJ SOLN
INTRAMUSCULAR | Status: AC
  Filled 2018-12-29: qty 10

## 2018-12-29 MED ORDER — PROPOFOL 10 MG/ML IV BOLUS
INTRAVENOUS | Status: DC | PRN
  Administered 2018-12-29: 40 mg via INTRAVENOUS

## 2018-12-29 NOTE — Progress Notes (Signed)
[place] [facility] [place] NAME: [name] [name]    MR#:  [id number]  DATE OF BIRTH:  Jul 06, 1940  SUBJECTIVE:  CHIEF COMPLAINT:   Chief Complaint  Patient presents with  . Rectal Bleeding  continue to have rectal bleeding.  Same start with bowel prep for colonoscopy planned for today REVIEW OF SYSTEMS:  Review of Systems  Constitutional: Positive for malaise/fatigue. Negative for diaphoresis, fever and weight loss.  HENT: Negative for ear discharge, ear pain, hearing loss, nosebleeds, sore throat and tinnitus.   Eyes: Negative for blurred vision and pain.  Respiratory: Negative for cough, hemoptysis, shortness of breath and wheezing.   Cardiovascular: Negative for chest pain, palpitations, orthopnea and leg swelling.  Gastrointestinal: Positive for blood in stool. Negative for abdominal pain, constipation, diarrhea, heartburn, nausea and vomiting.  Genitourinary: Negative for dysuria, frequency and urgency.  Musculoskeletal: Negative for back pain and myalgias.  Skin: Negative for itching and rash.  Neurological: Negative for dizziness, tingling, tremors, focal weakness, seizures, weakness and headaches.  Psychiatric/Behavioral: Negative for depression. The patient is not nervous/anxious.    DRUG ALLERGIES:   Allergies  Allergen Reactions  . Coconut Fatty Acids Swelling and Other (See Comments)    Throat swells   VITALS:  Blood pressure (!) 118/58, pulse 72, temperature 98.6 F (37 C), temperature source Oral, resp. rate 14, height 5\' 4"  (1.626 m), weight 67.9 kg, SpO2 93 %. PHYSICAL EXAMINATION:  Physical Exam HENT:     Head: Normocephalic and atraumatic.  Eyes:     Conjunctiva/sclera: Conjunctivae normal.     Pupils: Pupils are equal, round, and reactive to light.  Neck:     Musculoskeletal: Normal range of motion and neck supple.     Thyroid: No thyromegaly.     Trachea: No tracheal deviation.  Cardiovascular:     Rate and  Rhythm: Normal rate and regular rhythm.     Heart sounds: Normal heart sounds.  Pulmonary:     Effort: Pulmonary effort is normal. No respiratory distress.     Breath sounds: Normal breath sounds. No wheezing.  Chest:     Chest wall: No tenderness.  Abdominal:     General: Bowel sounds are normal. There is no distension.     Palpations: Abdomen is soft.     Tenderness: There is no abdominal tenderness.  Musculoskeletal: Normal range of motion.  Skin:    General: Skin is warm and dry.     Findings: No rash.  Neurological:     Mental Status: She is alert and oriented to person, place, and time.     Cranial Nerves: No cranial nerve deficit.    LABORATORY PANEL:  Female CBC Recent Labs  Lab 12/29/18 0455  WBC 6.0  HGB 7.2*  HCT 21.5*  PLT 141*   ------------------------------------------------------------------------------------------------------------------ Chemistries  Recent Labs  Lab 12/27/18 1801  12/29/18 0455  NA 138   < > 141  K 4.7   < > 4.1  CL 99   < > 105  CO2 26   < > 27  GLUCOSE 181*   < > 122*  BUN 23   < > 26*  CREATININE 1.51*   < > 1.23*  CALCIUM 9.3   < > 8.0*  AST 19  --   --   ALT 19  --   --   ALKPHOS 62  --   --   BILITOT 0.6  --   --    < > =  values in this interval not displayed.   RADIOLOGY:  No results found. ASSESSMENT AND PLAN:  46 y f admitted with syncope and GI bleed  * Syncope -Suspected vasovagal. Will hold off repeating echo as she just had it at Dr Laurelyn Sickle office in last 1-2 weeks  *Acute blood loss anemia -Hemoglobin 7.2 -We will order 1 packed red blood cell transfusion  * Rectal/GI bleed - bleeding scan not showing active bleed - does have increase activity in rectosigmoid area - d/w GI, plan for c-scope today. Monitor H & H for now - stop ASA, Plavix  * CAD (coronary artery disease) -hold asa & plavix. Continue other meds  * Constipation -PRN stool softener/laxative  * HYPERTENSION, BENIGN -continue home dose  antihypertensives   COPD (chronic obstructive pulmonary disease) with emphysema ([place]) -continue home meds   HLD (hyperlipidemia) -home dose antilipid   Adult hypothyroidism -home dose thyroid replacement     All the records are reviewed and case discussed with Care Management/Social Worker. Management plans discussed with the patient, nursing and they are in agreement.  CODE STATUS: DNR  TOTAL TIME TAKING CARE OF THIS PATIENT: 25 minutes.   More than 50% of the time was spent in counseling/coordination of care: YES  POSSIBLE D/C IN 1-2 DAYS, DEPENDING ON CLINICAL CONDITION.   Max Sane M.D on 12/29/2018 at 1:27 PM  Between 7am to 6pm - Pager - [phone]  After 6pm go to [email or web address] - password [email or web address] [facility]  [facility] [place] [facility]  Office  [phone]  CC: Primary care physician; Jerrol Banana., MD  Note: This dictation was prepared with Dragon dictation along with smaller phrase technology. Any transcriptional errors that result from this process are unintentional.

## 2018-12-29 NOTE — Progress Notes (Signed)
Returned from endoscopy without IVs running .  IV site swollen, red, and tender.  IV dc'd.

## 2018-12-29 NOTE — Care Management Important Message (Signed)
Important Message  Patient Details  Name: [name] [name] MRN: [id number] Date of Birth: [birth year]   Medicare Important Message Given:  Yes  Initial Medicare IM given by Patient Access Associate on 12/29/2018 at 11:56am.    Dannette Barbara 12/29/2018, 1:29 PM

## 2018-12-29 NOTE — Op Note (Signed)
[facility] Patient Name: [name] [name] Procedure Date: 12/29/2018 2:15 PM MRN: [id number] Account #: [id number] Date of Birth: 01-15-1941 Admit Type: Inpatient Age: 78 Room: [facility] ENDO ROOM 1 Gender: Female Note Status: Finalized Procedure:            Colonoscopy Indications:          Hematochezia, Acute post hemorrhagic anemia Providers:            Benay Pike. Joselito Fieldhouse MD, MD Medicines:            Propofol per Anesthesia Complications:        No immediate complications. Procedure:            Pre-Anesthesia Assessment:                       - The risks and benefits of the procedure and the                        sedation options and risks were discussed with the                        patient. All questions were answered and informed                        consent was obtained.                       - Patient identification and proposed procedure were                        verified prior to the procedure by the nurse. The                        procedure was verified in the procedure room.                       - ASA Grade Assessment: III - A patient with severe                        systemic disease.                       - After reviewing the risks and benefits, the patient                        was deemed in satisfactory condition to undergo the                        procedure.                       After obtaining informed consent, the colonoscope was                        passed under direct vision. Throughout the procedure,                        the patient's blood pressure, pulse, and oxygen                        saturations were monitored continuously. The  Colonoscope was introduced through the anus and                        advanced to the the cecum, identified by appendiceal                        orifice and ileocecal valve. The colonoscopy was                        somewhat difficult due to significant  looping.                        Successful completion of the procedure was aided by                        withdrawing and reinserting the scope. The patient                        tolerated the procedure well. The quality of the bowel                        preparation was adequate. The ileocecal valve,                        appendiceal orifice, and rectum were photographed. Findings:      The perianal and digital rectal examinations were normal. Pertinent       negatives include normal sphincter tone and no palpable rectal lesions.      Multiple small and large-mouthed diverticula were found in the left       colon.      A 5 mm polyp was found in the ascending colon. The polyp was sessile.       The polyp was removed with a cold snare. Resection and retrieval were       complete.      There is no endoscopic evidence of bleeding, inflammation, mass,       ulcerations or angioectasia in the entire colon.      Non-bleeding internal hemorrhoids were found during retroflexion. The       hemorrhoids were Grade I (internal hemorrhoids that do not prolapse).      The exam was otherwise without abnormality. Impression:           - Diverticulosis in the left colon.                       - One 5 mm polyp in the ascending colon, removed with a                        cold snare. Resected and retrieved.                       - Non-bleeding internal hemorrhoids.                       - The examination was otherwise normal. Recommendation:       - Patient has a contact number available for                        emergencies. The signs and symptoms of potential  delayed complications were discussed with the patient.                        Return to normal activities tomorrow. Written discharge                        instructions were provided to the patient.                       - Resume previous diet.                       - Continue present medications.                        - Await pathology results.                       - Return patient to hospital ward for possible                        discharge same day.                       - The findings and recommendations were discussed with                        the patient. Procedure Code(s):    --- Professional ---                       [phone], Colonoscopy, flexible; with removal of tumor(s),                        polyp(s), or other lesion(s) by snare technique Diagnosis Code(s):    --- Professional ---                       K57.30, Diverticulosis of large intestine without                        perforation or abscess without bleeding                       D62, Acute posthemorrhagic anemia                       K92.1, Melena (includes Hematochezia)                       K63.5, Polyp of colon                       K64.0, First degree hemorrhoids CPT copyright 2019 American Medical Association. All rights reserved. The codes documented in this report are preliminary and upon coder review may  be revised to meet current compliance requirements. Efrain Sella MD, MD 12/29/2018 2:54:30 PM This report has been signed electronically. Number of Addenda: 0 Note Initiated On: 12/29/2018 2:15 PM Scope Withdrawal Time: 0 hours 7 minutes 20 seconds  Total Procedure Duration: 0 hours 17 minutes 29 seconds  Estimated Blood Loss: Estimated blood loss: none.      [facility]

## 2018-12-29 NOTE — Anesthesia Procedure Notes (Signed)
Procedure Name: MAC Date/Time: 12/29/2018 2:25 PM Performed by: Lavone Orn, CRNA Pre-anesthesia Checklist: Patient identified, Emergency Drugs available, Suction available, Patient being monitored and Timeout performed Patient Re-evaluated:Patient Re-evaluated prior to induction Oxygen Delivery Method: Nasal cannula

## 2018-12-29 NOTE — Anesthesia Post-op Follow-up Note (Signed)
Anesthesia QCDR form completed.        

## 2018-12-29 NOTE — Progress Notes (Signed)
One large liquid stool, flecks of stool, brown/red in color.

## 2018-12-29 NOTE — Anesthesia Postprocedure Evaluation (Signed)
Anesthesia Post Note  Patient: [name] [name]  Procedure(s) Performed: COLONOSCOPY (N/A )  Patient location during evaluation: Endoscopy Anesthesia Type: General Level of consciousness: awake and alert Pain management: pain level controlled Vital Signs Assessment: post-procedure vital signs reviewed and stable Respiratory status: spontaneous breathing and respiratory function stable Cardiovascular status: stable Anesthetic complications: no     Last Vitals:  Vitals:   12/29/18 1348 12/29/18 1456  BP: (!) 156/61 (!) 133/46  Pulse: 79 76  Resp: 18 20  Temp:  36.8 C  SpO2: 97% 100%    Last Pain:  Vitals:   12/29/18 1456  TempSrc: Tympanic  PainSc: Asleep                 Lisaann Atha K

## 2018-12-29 NOTE — Transfer of Care (Signed)
Immediate Anesthesia Transfer of Care Note  Patient: [name] [name]  Procedure(s) Performed: COLONOSCOPY (N/A )  Patient Location: PACU and Endoscopy Unit  Anesthesia Type:General  Level of Consciousness: drowsy  Airway & Oxygen Therapy: Patient Spontanous Breathing and Patient connected to nasal cannula oxygen  Post-op Assessment: Report given to RN and Post -op Vital signs reviewed and stable  Post vital signs: stable  Last Vitals:  Vitals Value Taken Time  BP 133/46 12/29/18 1456  Temp 36.8 C 12/29/18 1456  Pulse 76 12/29/18 1456  Resp 20 12/29/18 1456  SpO2 100 % 12/29/18 1456    Last Pain:  Vitals:   12/29/18 1456  TempSrc: Tympanic  PainSc: Asleep         Complications: No apparent anesthesia complications

## 2018-12-29 NOTE — Interval H&P Note (Signed)
History and Physical Interval Note:  12/29/2018 2:24 PM  [name] [name]  has presented today for surgery, with the diagnosis of Lower GI bleeding, anemia.  The various methods of treatment have been discussed with the patient and family. After consideration of risks, benefits and other options for treatment, the patient has consented to  Procedure(s): COLONOSCOPY (N/A) as a surgical intervention.  The patient's history has been reviewed, patient examined, no change in status, stable for surgery.  I have reviewed the patient's chart and labs.  Questions were answered to the patient's satisfaction.     [place], [place]

## 2018-12-29 NOTE — Anesthesia Preprocedure Evaluation (Signed)
Anesthesia Evaluation  Patient identified by MRN, date of birth, ID band Patient awake    Reviewed: Allergy & Precautions, NPO status , Patient's Chart, lab work & pertinent test results  History of Anesthesia Complications Negative for: history of anesthetic complications  Airway Mallampati: III       Dental  (+) Upper Dentures, Lower Dentures   Pulmonary neg sleep apnea, COPD,  COPD inhaler, Not current smoker, former smoker,           Cardiovascular hypertension, Pt. on medications (-) Past MI and (-) CHF (-) dysrhythmias (-) Valvular Problems/Murmurs     Neuro/Psych neg Seizures CVA (pt with speech difficulty. Hard to find the right words), Residual Symptoms    GI/Hepatic Neg liver ROS, neg GERD  ,  Endo/Other  diabetes, Type 2, Oral Hypoglycemic AgentsHypothyroidism   Renal/GU Renal InsufficiencyRenal disease     Musculoskeletal   Abdominal   Peds  Hematology   Anesthesia Other Findings   Reproductive/Obstetrics                             Anesthesia Physical Anesthesia Plan  ASA: III  Anesthesia Plan: General   Post-op Pain Management:    Induction:   PONV Risk Score and Plan: 3 and Ondansetron, TIVA and Midazolam  Airway Management Planned: Nasal Cannula  Additional Equipment:   Intra-op Plan:   Post-operative Plan:   Informed Consent: I have reviewed the patients History and Physical, chart, labs and discussed the procedure including the risks, benefits and alternatives for the proposed anesthesia with the patient or authorized representative who has indicated his/her understanding and acceptance.       Plan Discussed with:   Anesthesia Plan Comments:         Anesthesia Quick Evaluation

## 2018-12-30 LAB — BPAM RBC
Blood Product Expiration Date: 202010102359
Blood Product Expiration Date: 202010122359
ISSUE DATE / TIME: 202009230059
ISSUE DATE / TIME: 202009242111
Unit Type and Rh: 5100
Unit Type and Rh: 5100

## 2018-12-30 LAB — TYPE AND SCREEN
ABO/RH(D): O POS
Antibody Screen: NEGATIVE
Unit division: 0
Unit division: 0

## 2018-12-30 LAB — CBC
HCT: 29 % — ABNORMAL LOW (ref 36.0–46.0)
Hemoglobin: 9.7 g/dL — ABNORMAL LOW (ref 12.0–15.0)
MCH: 29.7 pg (ref 26.0–34.0)
MCHC: 33.4 g/dL (ref 30.0–36.0)
MCV: 88.7 fL (ref 80.0–100.0)
Platelets: 154 10*3/uL (ref 150–400)
RBC: 3.27 MIL/uL — ABNORMAL LOW (ref 3.87–5.11)
RDW: 18.2 % — ABNORMAL HIGH (ref 11.5–15.5)
WBC: 6.1 10*3/uL (ref 4.0–10.5)
nRBC: 0 % (ref 0.0–0.2)

## 2018-12-30 LAB — BASIC METABOLIC PANEL
Anion gap: 11 (ref 5–15)
BUN: 12 mg/dL (ref 8–23)
CO2: 27 mmol/L (ref 22–32)
Calcium: 8.3 mg/dL — ABNORMAL LOW (ref 8.9–10.3)
Chloride: 104 mmol/L (ref 98–111)
Creatinine, Ser: 1.09 mg/dL — ABNORMAL HIGH (ref 0.44–1.00)
GFR calc Af Amer: 57 mL/min — ABNORMAL LOW (ref >60)
GFR calc non Af Amer: 49 mL/min — ABNORMAL LOW (ref >60)
Glucose, Bld: 145 mg/dL — ABNORMAL HIGH (ref 70–99)
Potassium: 3.6 mmol/L (ref 3.5–5.1)
Sodium: 142 mmol/L (ref 135–145)

## 2018-12-30 NOTE — Discharge Instructions (Signed)

## 2018-12-30 NOTE — Progress Notes (Signed)
[name] [name] to be D/C'd Home per MD order.  Discussed prescriptions and follow up appointments with the patient. Prescriptions given to patient, medication list explained in detail. Pt verbalized understanding.  Allergies as of 12/30/2018      Reactions   Coconut Fatty Acids Swelling, Other (See Comments)   Throat swells      Medication List    TAKE these medications   albuterol 108 (90 Base) MCG/ACT inhaler Commonly known as: VENTOLIN HFA Inhale 2 puffs into the lungs every 4 (four) hours as needed for wheezing or shortness of breath.   amLODipine 5 MG tablet Commonly known as: NORVASC Take 5 mg by mouth daily.   aspirin 81 MG EC tablet Take 1 tablet (81 mg total) by mouth daily.   atorvastatin 40 MG tablet Commonly known as: LIPITOR Take 1 tablet (40 mg total) by mouth daily at 6 PM.   budesonide-formoterol 80-4.5 MCG/ACT inhaler Commonly known as: Symbicort Inhale 2 puffs into the lungs 2 (two) times daily.   clopidogrel 75 MG tablet Commonly known as: PLAVIX TAKE 1 TABLET BY MOUTH EVERY DAY   fluticasone 50 MCG/ACT nasal spray Commonly known as: FLONASE Place 2 sprays into both nostrils daily.   gabapentin 600 MG tablet Commonly known as: NEURONTIN Take 0.5 tablets (300 mg total) by mouth 2 (two) times daily.   glimepiride 2 MG tablet Commonly known as: AMARYL Take 1 tablet (2 mg total) by mouth 2 (two) times daily.   ICaps Caps Take 1 capsule by mouth 2 (two) times daily.   isosorbide mononitrate 30 MG 24 hr tablet Commonly known as: IMDUR TAKE 1 TABLET BY MOUTH  DAILY   LORazepam 1 MG tablet Commonly known as: ATIVAN Take 1 tablet (1 mg total) by mouth 2 (two) times daily as needed for anxiety.   magnesium oxide 400 (241.3 Mg) MG tablet Commonly known as: MAG-OX Take 400 mg by mouth 2 (two) times daily.   metFORMIN 500 MG tablet Commonly known as: GLUCOPHAGE Take 1 tablet (500 mg total) by mouth 2 (two) times daily with a meal. What changed:  additional instructions   metoprolol succinate 100 MG 24 hr tablet Commonly known as: TOPROL-XL Take 100 mg by mouth daily.   mometasone 0.1 % lotion Commonly known as: ELOCON PLACE 4 DROPS INTO EACH EAR CANAL AT BEDTIME AS NEEDED FOR DRY SKIN/ITCHING/DISCOMFORT.   multivitamin tablet Take 0.5 tablets by mouth 2 (two) times daily.   nitroGLYCERIN 0.4 MG SL tablet Commonly known as: NITROSTAT Place 1 tablet (0.4 mg total) under the tongue every 5 (five) minutes as needed for chest pain.   OneTouch Ultra test strip Generic drug: glucose blood USE TO CHECK BLOOD SUGAR ONCE DAILY   pantoprazole 40 MG tablet Commonly known as: PROTONIX Take 1 tablet (40 mg total) by mouth daily.   sucralfate 1 g tablet Commonly known as: CARAFATE Take 1 g by mouth 2 (two) times daily.   vitamin E 400 UNIT capsule Generic drug: vitamin E Take 400 Units by mouth daily.       Vitals:   12/30/18 0431 12/30/18 0735  BP: (!) 156/65 (!) 165/56  Pulse: 78 71  Resp: 18 19  Temp: 98.1 F (36.7 C) (!) 97.5 F (36.4 C)  SpO2: 94% 97%    Tele box removed and returned.Skin clean, dry and intact without evidence of skin break down, no evidence of skin tears noted. IV catheter discontinued intact. Site without signs and symptoms of complications. Dressing and  pressure applied. Pt denies pain at this time. No complaints noted.  An After Visit Summary was printed and given to the patient. Patient escorted via [place], and D/C home via private auto.  Rolley Sims

## 2019-01-02 ENCOUNTER — Telehealth: Payer: Self-pay

## 2019-01-02 LAB — SURGICAL PATHOLOGY

## 2019-01-02 NOTE — Telephone Encounter (Signed)
HFU scheduled for 01/11/19 @ 10:40 AM.

## 2019-01-02 NOTE — Telephone Encounter (Signed)
Transition Care Management Follow-up Telephone Call  Date of discharge and from where: [facility] on 12/30/18  How have you been since you were released from the hospital? Doing better. Pt had one episode where she thought she was having a stoke and EMS came out and said she may have had a mini stroke but they couldn't make her go to the hospital and pt did not want to go back. Pt was in coherent and she could not concentrate on what she was doing. Pt is currently not having an s/s including rectal bleeding. Pt states she is a little tired but does not feel weak or like she did upon going to the hospital.   Any questions or concerns? No   Items Reviewed:  Did the pt receive and understand the discharge instructions provided? Yes   Medications obtained and verified? No, pt declined reviewing over the phone. Will review at [place] apt.  Any new allergies since your discharge? No   Dietary orders reviewed? Yes  Do you have support at home? Yes   Other (ie: DME, Home Health, etc) N/A  Functional Questionnaire: (I = Independent and D = Dependent)  Bathing/Dressing- I   Meal Prep- I  Eating- I  Maintaining continence- I  Transferring/Ambulation- D, uses a cane, walker and wheelchair.   Managing Meds- D, husband manages medications.   Follow up appointments reviewed:    PCP Hospital f/u appt confirmed? Yes  Scheduled to see Dr Rosanna Randy on 01/10/19 @ 1:40 PM.  [facility] f/u appt confirmed? Yes   Are transportation arrangements needed? No   If their condition worsens, is the pt aware to call  their PCP or go to the ED? Yes  Was the patient provided with contact information for the PCP's office or ED? Yes  Was the pt encouraged to call back with questions or concerns? Yes

## 2019-01-02 NOTE — Discharge Summary (Signed)
[facility] [place] NAME: [name] [name]    MR#:  [id number]  DATE OF BIRTH:  Jun 18, 1940  DATE OF ADMISSION:  12/27/2018   ADMITTING PHYSICIAN: Lance Coon, MD  DATE OF DISCHARGE: 12/30/2018  1:45 PM  PRIMARY CARE PHYSICIAN: Jerrol Banana., MD   ADMISSION DIAGNOSIS:  Rectal bleeding [K62.5] Syncope, unspecified syncope type [R55] DISCHARGE DIAGNOSIS:  Principal Problem:   Syncope Active Problems:   HYPERTENSION, BENIGN   COPD (chronic obstructive pulmonary disease) with emphysema (HCC)   CAD (coronary artery disease)   HLD (hyperlipidemia)   Adult hypothyroidism   Primary cancer of right upper lobe of lung (HCC)   Blood per rectum   Constipation   GI bleed  SECONDARY DIAGNOSIS:   Past Medical History:  Diagnosis Date  . Abnormal CT lung screening 10/17/2015  . COPD (chronic obstructive pulmonary disease) ([place])   . Coronary artery disease, non-occlusive    a. cath 2006: min nonobs CAD; b. cath 12/2010: cath LAD 50%, RCA 60%; c. 08/2013: Minimal luminal irregs, right dominant system with no significant CAD, diffuse luminal irregs noted. Normal EF 55%, no AS or MS.   . Diabetes mellitus   . Hyperlipemia    Followed by Dr. Rosanna Randy  . Hypertension   . Lung cancer ([place])   . Macular degeneration    rt  . Personal history of tobacco use, presenting hazards to health 10/15/2015  . Pneumonia    hx  . Shortness of breath   . Stroke [name])    HOSPITAL COURSE:  78 y f admitted with syncope and GI bleed  * Syncope -Suspected vasovagal etio  *Acute blood loss anemia - s/p 1 packed red blood cell transfusion  * Rectal/GI bleed - bleeding scan not showing active bleed - s/p c-scope on 9/24 showing diverticulosis in the left colon. one 5 mm polyp in the ascending colon which was removed with a cold snare. Resected and retrieved. Non-bleeding internal hemorrhoids.The examination was otherwise normal  *CAD (coronary  artery disease) -hold asa & plavix. Continue other meds  * Constipation -PRN stool softener/laxative  *HYPERTENSION, BENIGN -continue home dose antihypertensives COPD (chronic obstructive pulmonary disease) with emphysema ([place]) -continue home meds HLD (hyperlipidemia) -home dose antilipid Adult hypothyroidism -home dose thyroid replacement  DISCHARGE CONDITIONS:  stable CONSULTS OBTAINED:   DRUG ALLERGIES:   Allergies  Allergen Reactions  . Coconut Fatty Acids Swelling and Other (See Comments)    Throat swells   DISCHARGE MEDICATIONS:   Allergies as of 12/30/2018      Reactions   Coconut Fatty Acids Swelling, Other (See Comments)   Throat swells      Medication List    TAKE these medications   albuterol 108 (90 Base) MCG/ACT inhaler Commonly known as: VENTOLIN HFA Inhale 2 puffs into the lungs every 4 (four) hours as needed for wheezing or shortness of breath.   amLODipine 5 MG tablet Commonly known as: NORVASC Take 5 mg by mouth daily.   aspirin 81 MG EC tablet Take 1 tablet (81 mg total) by mouth daily.   atorvastatin 40 MG tablet Commonly known as: LIPITOR Take 1 tablet (40 mg total) by mouth daily at 6 PM.   budesonide-formoterol 80-4.5 MCG/ACT inhaler Commonly known as: Symbicort Inhale 2 puffs into the lungs 2 (two) times daily.   clopidogrel 75 MG tablet Commonly known as: PLAVIX TAKE 1 TABLET BY MOUTH EVERY DAY   fluticasone 50 MCG/ACT nasal spray Commonly  known as: FLONASE Place 2 sprays into both nostrils daily.   gabapentin 600 MG tablet Commonly known as: NEURONTIN Take 0.5 tablets (300 mg total) by mouth 2 (two) times daily.   glimepiride 2 MG tablet Commonly known as: AMARYL Take 1 tablet (2 mg total) by mouth 2 (two) times daily.   ICaps Caps Take 1 capsule by mouth 2 (two) times daily.   isosorbide mononitrate 30 MG 24 hr tablet Commonly known as: IMDUR TAKE 1 TABLET BY MOUTH  DAILY   LORazepam 1 MG tablet Commonly  known as: ATIVAN Take 1 tablet (1 mg total) by mouth 2 (two) times daily as needed for anxiety.   magnesium oxide 400 (241.3 Mg) MG tablet Commonly known as: MAG-OX Take 400 mg by mouth 2 (two) times daily.   metFORMIN 500 MG tablet Commonly known as: GLUCOPHAGE Take 1 tablet (500 mg total) by mouth 2 (two) times daily with a meal. What changed: additional instructions   metoprolol succinate 100 MG 24 hr tablet Commonly known as: TOPROL-XL Take 100 mg by mouth daily.   mometasone 0.1 % lotion Commonly known as: ELOCON PLACE 4 DROPS INTO EACH EAR CANAL AT BEDTIME AS NEEDED FOR DRY SKIN/ITCHING/DISCOMFORT.   multivitamin tablet Take 0.5 tablets by mouth 2 (two) times daily.   nitroGLYCERIN 0.4 MG SL tablet Commonly known as: NITROSTAT Place 1 tablet (0.4 mg total) under the tongue every 5 (five) minutes as needed for chest pain.   OneTouch Ultra test strip Generic drug: glucose blood USE TO CHECK BLOOD SUGAR ONCE DAILY   pantoprazole 40 MG tablet Commonly known as: PROTONIX Take 1 tablet (40 mg total) by mouth daily.   sucralfate 1 g tablet Commonly known as: CARAFATE Take 1 g by mouth 2 (two) times daily.   vitamin E 400 UNIT capsule Generic drug: vitamin E Take 400 Units by mouth daily.      DISCHARGE INSTRUCTIONS:   DIET:  Regular diet DISCHARGE CONDITION:  Stable ACTIVITY:  Activity as tolerated OXYGEN:  Home Oxygen: No.  Oxygen Delivery: room air DISCHARGE LOCATION:  home   If you experience worsening of your admission symptoms, develop shortness of breath, life threatening emergency, suicidal or homicidal thoughts you must seek medical attention immediately by calling 911 or calling your MD immediately  if symptoms less severe.  You Must read complete instructions/literature along with all the possible adverse reactions/side effects for all the Medicines you take and that have been prescribed to you. Take any new Medicines after you have completely  understood and accpet all the possible adverse reactions/side effects.   Please note  You were cared for by a hospitalist during your hospital stay. If you have any questions about your discharge medications or the care you received while you were in the hospital after you are discharged, you can call the unit and asked to speak with the hospitalist on call if the hospitalist that took care of you is not available. Once you are discharged, your primary care physician will handle any further medical issues. Please note that NO REFILLS for any discharge medications will be authorized once you are discharged, as it is imperative that you return to your primary care physician (or establish a relationship with a primary care physician if you do not have one) for your aftercare needs so that they can reassess your need for medications and monitor your lab values.    On the day of Discharge:  VITAL SIGNS:  Blood pressure (!) 165/56, pulse  71, temperature (!) 97.5 F (36.4 C), temperature source Oral, resp. rate 19, height 5\' 4"  (1.626 m), weight 66.2 kg, SpO2 97 %. PHYSICAL EXAMINATION:  GENERAL:  78 y.o.-year-old patient lying in the bed with no acute distress.  EYES: Pupils equal, round, reactive to light and accommodation. No scleral icterus. Extraocular muscles intact.  HEENT: Head atraumatic, normocephalic. Oropharynx and nasopharynx clear.  NECK:  Supple, no jugular venous distention. No thyroid enlargement, no tenderness.  LUNGS: Normal breath sounds bilaterally, no wheezing, rales,rhonchi or crepitation. No use of accessory muscles of respiration.  CARDIOVASCULAR: S1, S2 normal. No murmurs, rubs, or gallops.  ABDOMEN: Soft, non-tender, non-distended. Bowel sounds present. No organomegaly or mass.  EXTREMITIES: No pedal edema, cyanosis, or clubbing.  NEUROLOGIC: Cranial nerves II through XII are intact. Muscle strength 5/5 in all extremities. Sensation intact. Gait not checked.  PSYCHIATRIC:  The patient is alert and oriented x 3.  SKIN: No obvious rash, lesion, or ulcer.  DATA REVIEW:   CBC Recent Labs  Lab 12/30/18 0708  WBC 6.1  HGB 9.7*  HCT 29.0*  PLT 154    Chemistries  Recent Labs  Lab 12/27/18 1801  12/30/18 0708  NA 138   < > 142  K 4.7   < > 3.6  CL 99   < > 104  CO2 26   < > 27  GLUCOSE 181*   < > 145*  BUN 23   < > 12  CREATININE 1.51*   < > 1.09*  CALCIUM 9.3   < > 8.3*  AST 19  --   --   ALT 19  --   --   ALKPHOS 62  --   --   BILITOT 0.6  --   --    < > = values in this interval not displayed.     Follow-up Information    Jerrol Banana., MD. Go on 01/11/2019.   Specialty: Family Medicine Why: APPOINTMENT A0 10:40AM Contact information: [address] [place] [address] [phone]        Efrain Sella, MD. Go on 01/16/2019.   Specialty: Gastroenterology Why: APPOINTMENT AT 9AM Contact information: [place]  [id number] [phone]           Management plans discussed with the patient, family and they are in agreement.  CODE STATUS: Prior   TOTAL TIME TAKING CARE OF THIS PATIENT:35 minutes.    Max Sane M.D on 01/02/2019 at 5:17 PM  Between 7am to 6pm - Pager - [phone]  After 6pm go to [email or web address] - password [email or web address] [facility]  [employer]  Hospitalists  Office  [phone]  CC: Primary care physician; Jerrol Banana., MD   Note: This dictation was prepared with Dragon dictation along with smaller phrase technology. Any transcriptional errors that result from this process are unintentional.

## 2019-01-03 ENCOUNTER — Ambulatory Visit: Payer: Self-pay | Admitting: Family Medicine

## 2019-01-05 ENCOUNTER — Ambulatory Visit: Payer: Self-pay | Admitting: Family Medicine

## 2019-01-06 ENCOUNTER — Telehealth: Payer: Self-pay

## 2019-01-06 NOTE — Telephone Encounter (Signed)
Mr. Starkes called to report that [name] [name] has continued to have rectal bleeding. Patient was admitted to [facility] last week and had several test and imaging done. Mr. Slagel reports that [name] [name] has been having trouble with bowel movement since coming home from the hospital. They report that they had a bad experience and does not want to go back to [facility]. Patient has taken milk of magnesium last night and did have a bowel movement but had dark red blood in it. Please advise.

## 2019-01-06 NOTE — Telephone Encounter (Signed)
She has upcoming appt with [facility].  Please advise her to call them and see if they will see her.  She did have a good work-up in the hospital that shoed no active bleeding.  If recurs or patinet feels faint/weak, may need to go back to ED this weekend (could go to [facility] in [place] if she does not want to go to [facility]).

## 2019-01-06 NOTE — Telephone Encounter (Signed)
Patient advised. She states the bleeding stopped now.

## 2019-01-10 ENCOUNTER — Ambulatory Visit (INDEPENDENT_AMBULATORY_CARE_PROVIDER_SITE_OTHER): Payer: Medicare Other | Admitting: Family Medicine

## 2019-01-10 ENCOUNTER — Encounter: Payer: Self-pay | Admitting: Family Medicine

## 2019-01-10 ENCOUNTER — Other Ambulatory Visit: Payer: Self-pay

## 2019-01-10 VITALS — BP 124/76 | HR 67 | Temp 96.9°F | Resp 18 | Wt 148.0 lb

## 2019-01-10 DIAGNOSIS — K2100 Gastro-esophageal reflux disease with esophagitis, without bleeding: Secondary | ICD-10-CM

## 2019-01-10 DIAGNOSIS — D500 Iron deficiency anemia secondary to blood loss (chronic): Secondary | ICD-10-CM | POA: Diagnosis not present

## 2019-01-10 DIAGNOSIS — K625 Hemorrhage of anus and rectum: Secondary | ICD-10-CM

## 2019-01-10 DIAGNOSIS — E1142 Type 2 diabetes mellitus with diabetic polyneuropathy: Secondary | ICD-10-CM

## 2019-01-10 DIAGNOSIS — J449 Chronic obstructive pulmonary disease, unspecified: Secondary | ICD-10-CM | POA: Diagnosis not present

## 2019-01-10 MED ORDER — PANTOPRAZOLE SODIUM 40 MG PO TBEC: 40.0000 mg | DELAYED_RELEASE_TABLET | Freq: Two times a day (BID) | ORAL | 3 refills | Status: DC

## 2019-01-10 NOTE — Patient Instructions (Signed)
1. Increase Pantoprazole to 40 mg twice a day. 2. Remember to take Symbicort twice a day.

## 2019-01-10 NOTE — Progress Notes (Signed)
Patient: [name] [name] Female    DOB: August 03, 1940   78 y.o.   MRN: [id number] Visit Date: 01/10/2019  Today's Provider: [name], MD   Chief Complaint  Patient presents with  . Hospitalization Follow-up   Subjective:     HPI  Hospital follow up for rectal bleeding.  This is a transition of care visit for anemia and GI bleed.  She is feeling fatigued but the GI bleed is resolved. Other than fatigue she is feeling back to baseline.  Allergies  Allergen Reactions  . Coconut Fatty Acids Swelling and Other (See Comments)    Throat swells     Current Outpatient Medications:  .  albuterol (VENTOLIN HFA) 108 (90 Base) MCG/ACT inhaler, Inhale 2 puffs into the lungs every 4 (four) hours as needed for wheezing or shortness of breath., Disp: 8 g, Rfl: 11 .  amLODipine (NORVASC) 5 MG tablet, Take 5 mg by mouth daily. , Disp: , Rfl:  .  aspirin EC 81 MG EC tablet, Take 1 tablet (81 mg total) by mouth daily., Disp: 30 tablet, Rfl: 0 .  atorvastatin (LIPITOR) 40 MG tablet, Take 1 tablet (40 mg total) by mouth daily at 6 PM., Disp: 90 tablet, Rfl: 3 .  budesonide-formoterol (SYMBICORT) 80-4.5 MCG/ACT inhaler, Inhale 2 puffs into the lungs 2 (two) times daily., Disp: 1 Inhaler, Rfl: 2 .  clopidogrel (PLAVIX) 75 MG tablet, TAKE 1 TABLET BY MOUTH EVERY DAY (Patient taking differently: Take 75 mg by mouth daily. ), Disp: 90 tablet, Rfl: 1 .  fluticasone (FLONASE) 50 MCG/ACT nasal spray, Place 2 sprays into both nostrils daily., Disp: 16 g, Rfl: 6 .  gabapentin (NEURONTIN) 600 MG tablet, Take 0.5 tablets (300 mg total) by mouth 2 (two) times daily., Disp: 180 tablet, Rfl: 3 .  glimepiride (AMARYL) 2 MG tablet, Take 1 tablet (2 mg total) by mouth 2 (two) times daily., Disp: 180 tablet, Rfl: 3 .  isosorbide mononitrate (IMDUR) 30 MG 24 hr tablet, TAKE 1 TABLET BY MOUTH  DAILY (Patient taking differently: Take 30 mg by mouth daily. ), Disp: 90 tablet, Rfl: 3 .  LORazepam (ATIVAN) 1 MG  tablet, Take 1 tablet (1 mg total) by mouth 2 (two) times daily as needed for anxiety., Disp: 30 tablet, Rfl: 5 .  magnesium oxide (MAG-OX) 400 (241.3 MG) MG tablet, Take 400 mg by mouth 2 (two) times daily. , Disp: , Rfl:  .  metFORMIN (GLUCOPHAGE) 500 MG tablet, Take 1 tablet (500 mg total) by mouth 2 (two) times daily with a meal. (Patient taking differently: Take 500 mg by mouth 2 (two) times daily with a meal. Can only tolerate once daily with meal), Disp: 180 tablet, Rfl: 3 .  metoprolol succinate (TOPROL-XL) 100 MG 24 hr tablet, Take 100 mg by mouth daily., Disp: , Rfl:  .  mometasone (ELOCON) 0.1 % lotion, PLACE 4 DROPS INTO EACH EAR CANAL AT BEDTIME AS NEEDED FOR DRY SKIN/ITCHING/DISCOMFORT., Disp: , Rfl:  .  Multiple Vitamin (MULTIVITAMIN) tablet, Take 0.5 tablets by mouth 2 (two) times daily., Disp: , Rfl:  .  Multiple Vitamins-Minerals (ICAPS) CAPS, Take 1 capsule by mouth 2 (two) times daily. , Disp: , Rfl:  .  nitroGLYCERIN (NITROSTAT) 0.4 MG SL tablet, Place 1 tablet (0.4 mg total) under the tongue every 5 (five) minutes as needed for chest pain., Disp: 25 tablet, Rfl: 3 .  ONETOUCH ULTRA test strip, USE TO CHECK BLOOD SUGAR ONCE DAILY, Disp:  50 strip, Rfl: 23 .  pantoprazole (PROTONIX) 40 MG tablet, Take 1 tablet (40 mg total) by mouth daily., Disp: 90 tablet, Rfl: 3 .  sucralfate (CARAFATE) 1 g tablet, Take 1 g by mouth 2 (two) times daily. , Disp: , Rfl:  .  vitamin E (VITAMIN E) 400 UNIT capsule, Take 400 Units by mouth daily., Disp: , Rfl:  No current facility-administered medications for this visit.   Facility-Administered Medications Ordered in Other Visits:  .  ondansetron (ZOFRAN) 8 mg, dexamethasone (DECADRON) 10 mg in sodium chloride 0.9 % 50 mL IVPB, , Intravenous, Once, Finnegan, Kathlene November, MD  Review of Systems  Constitutional: Positive for fatigue.  Respiratory: Positive for cough and shortness of breath.   Cardiovascular: Negative.   Gastrointestinal: Negative.    Endocrine: Negative.   Musculoskeletal: Negative.   Allergic/Immunologic: Negative.   Hematological: Negative.   Psychiatric/Behavioral: Negative.   All other systems reviewed and are negative.   Social History   Tobacco Use  . Smoking status: Former Smoker    Packs/day: 1.00    Years: 50.00    Pack years: 50.00    Types: Cigarettes    Quit date: 10/03/2015    Years since quitting: 3.2  . Smokeless tobacco: Never Used  . Tobacco comment: smokes 3 cigs daily 05/06/15. Pt instructed to quit.  Substance Use Topics  . Alcohol use: No    Alcohol/week: 0.0 standard drinks      Objective:   BP 124/76 (BP Location: Left Arm, Patient Position: Sitting, Cuff Size: Normal)   Pulse 67   Temp (!) 96.9 F (36.1 C) (Temporal)   Resp 18   Wt 148 lb (67.1 kg)   SpO2 93%   BMI 25.40 kg/m  Vitals:   01/10/19 1348  BP: 124/76  Pulse: 67  Resp: 18  Temp: (!) 96.9 F (36.1 C)  TempSrc: Temporal  SpO2: 93%  Weight: 148 lb (67.1 kg)  Body mass index is 25.4 kg/m.   Physical Exam Vitals signs reviewed.  Constitutional:      Appearance: She is well-developed.     Comments: Patient sitting in wheelchair due to chronic weakness.  HENT:     Head: Normocephalic and atraumatic.     Right Ear: External ear normal.     Left Ear: External ear normal.     Nose: Nose normal.  Eyes:     General: No scleral icterus.    Conjunctiva/sclera: Conjunctivae normal.  Neck:     Thyroid: No thyromegaly.  Cardiovascular:     Rate and Rhythm: Normal rate and regular rhythm.     Heart sounds: Normal heart sounds.  Pulmonary:     Effort: Pulmonary effort is normal.     Breath sounds: Normal breath sounds.  Abdominal:     Palpations: Abdomen is soft.  Musculoskeletal:     Right lower leg: No edema.     Left lower leg: No edema.  Skin:    General: Skin is warm and dry.  Neurological:     Mental Status: She is alert and oriented to person, place, and time. Mental status is at baseline.   Psychiatric:        Mood and Affect: Mood normal.        Behavior: Behavior normal.        Thought Content: Thought content normal.        Judgment: Judgment normal.      No results found for any visits on 01/10/19.  Assessment & Plan    1. Gastroesophageal reflux disease with esophagitis without hemorrhage  - pantoprazole (PROTONIX) 40 MG tablet; Take 1 tablet (40 mg total) by mouth 2 (two) times daily.  Dispense: 30 tablet; Refill: 3  2. Gastrointestinal hemorrhage associated with anorectal source W/u per GI. - CBC w/Diff/Platelet - Ferritin  3. Iron deficiency anemia due to chronic blood loss Return to clinic 2 to 4 weeks to follow-up. - CBC w/Diff/Platelet - Ferritin  4. Chronic obstructive pulmonary disease, unspecified COPD type ([place])  - CBC w/Diff/Platelet  5. Diabetic polyneuropathy associated with type 2 diabetes mellitus (HCC)  - HgB A1c     [name], MD  [facility]

## 2019-01-11 ENCOUNTER — Ambulatory Visit: Payer: Self-pay | Admitting: Family Medicine

## 2019-01-11 LAB — HEMOGLOBIN A1C
Est. average glucose Bld gHb Est-mCnc: 123 mg/dL
Hgb A1c MFr Bld: 5.9 % — ABNORMAL HIGH (ref 4.8–5.6)

## 2019-01-11 LAB — CBC WITH DIFFERENTIAL/PLATELET
Basophils Absolute: 0 10*3/uL (ref 0.0–0.2)
Basos: 0 %
EOS (ABSOLUTE): 0.1 10*3/uL (ref 0.0–0.4)
Eos: 2 %
Hematocrit: 28.7 % — ABNORMAL LOW (ref 34.0–46.6)
Hemoglobin: 9.1 g/dL — ABNORMAL LOW (ref 11.1–15.9)
Immature Grans (Abs): 0 10*3/uL (ref 0.0–0.1)
Immature Granulocytes: 0 %
Lymphocytes Absolute: 0.9 10*3/uL (ref 0.7–3.1)
Lymphs: 13 %
MCH: 30 pg (ref 26.6–33.0)
MCHC: 31.7 g/dL (ref 31.5–35.7)
MCV: 95 fL (ref 79–97)
Monocytes Absolute: 0.5 10*3/uL (ref 0.1–0.9)
Monocytes: 8 %
Neutrophils Absolute: 5 10*3/uL (ref 1.4–7.0)
Neutrophils: 77 %
Platelets: 307 10*3/uL (ref 150–450)
RBC: 3.03 x10E6/uL — ABNORMAL LOW (ref 3.77–5.28)
RDW: 16.7 % — ABNORMAL HIGH (ref 11.7–15.4)
WBC: 6.6 10*3/uL (ref 3.4–10.8)

## 2019-01-11 LAB — FERRITIN: Ferritin: 105 ng/mL (ref 15–150)

## 2019-01-12 ENCOUNTER — Telehealth: Payer: Self-pay

## 2019-01-12 NOTE — Telephone Encounter (Signed)
-----   Message from Jerrol Banana., MD sent at 01/12/2019  9:16 AM EDT ----- CBC stable.  Is patient patient taking iron daily?  If not start iron daily and repeat CBC 2 weeks

## 2019-01-12 NOTE — Telephone Encounter (Signed)
Pt advised.  She is not taking iron at this time.  She will start and get her labs rechecked in two weeks.   Thanks,   -Mickel Baas

## 2019-01-16 DIAGNOSIS — H353221 Exudative age-related macular degeneration, left eye, with active choroidal neovascularization: Secondary | ICD-10-CM | POA: Diagnosis not present

## 2019-01-22 ENCOUNTER — Other Ambulatory Visit: Payer: Self-pay | Admitting: Family Medicine

## 2019-01-22 DIAGNOSIS — K2100 Gastro-esophageal reflux disease with esophagitis, without bleeding: Secondary | ICD-10-CM

## 2019-01-24 DIAGNOSIS — R0602 Shortness of breath: Secondary | ICD-10-CM | POA: Diagnosis not present

## 2019-01-24 DIAGNOSIS — J449 Chronic obstructive pulmonary disease, unspecified: Secondary | ICD-10-CM | POA: Diagnosis not present

## 2019-01-26 DIAGNOSIS — J449 Chronic obstructive pulmonary disease, unspecified: Secondary | ICD-10-CM | POA: Diagnosis not present

## 2019-01-31 ENCOUNTER — Other Ambulatory Visit: Payer: Self-pay | Admitting: Gastroenterology

## 2019-01-31 DIAGNOSIS — K219 Gastro-esophageal reflux disease without esophagitis: Secondary | ICD-10-CM

## 2019-01-31 DIAGNOSIS — K922 Gastrointestinal hemorrhage, unspecified: Secondary | ICD-10-CM | POA: Diagnosis not present

## 2019-01-31 DIAGNOSIS — Z85118 Personal history of other malignant neoplasm of bronchus and lung: Secondary | ICD-10-CM | POA: Diagnosis not present

## 2019-01-31 DIAGNOSIS — Z8673 Personal history of transient ischemic attack (TIA), and cerebral infarction without residual deficits: Secondary | ICD-10-CM | POA: Diagnosis not present

## 2019-01-31 DIAGNOSIS — J449 Chronic obstructive pulmonary disease, unspecified: Secondary | ICD-10-CM | POA: Diagnosis not present

## 2019-02-08 ENCOUNTER — Ambulatory Visit
Admission: RE | Admit: 2019-02-08 | Discharge: 2019-02-08 | Disposition: A | Discharge status: Home / Self Care | Payer: Medicare Other | Source: Ambulatory Visit | Attending: Gastroenterology | Admitting: Gastroenterology

## 2019-02-08 ENCOUNTER — Other Ambulatory Visit: Payer: Self-pay

## 2019-02-08 DIAGNOSIS — K219 Gastro-esophageal reflux disease without esophagitis: Secondary | ICD-10-CM | POA: Insufficient documentation

## 2019-02-15 ENCOUNTER — Other Ambulatory Visit: Payer: Self-pay | Admitting: Family Medicine

## 2019-02-15 DIAGNOSIS — I6523 Occlusion and stenosis of bilateral carotid arteries: Secondary | ICD-10-CM

## 2019-02-15 DIAGNOSIS — E1142 Type 2 diabetes mellitus with diabetic polyneuropathy: Secondary | ICD-10-CM

## 2019-02-15 MED ORDER — NITROGLYCERIN 0.4 MG SL SUBL: 0.4000 mg | SUBLINGUAL_TABLET | SUBLINGUAL | 3 refills | Status: DC | PRN

## 2019-02-15 MED ORDER — ONETOUCH ULTRA VI STRP: ORAL_STRIP | 3 refills | Status: DC

## 2019-02-15 NOTE — Telephone Encounter (Signed)
Please review medication dosage for Metoprolol. Office visit from 11/10/2018 had a different dosage documented.than what is being requested.

## 2019-02-15 NOTE — Telephone Encounter (Signed)
[facility] faxed refill request for the following medications:  nitroGLYCERIN (NITROSTAT) 0.4 MG SL tablet  metoprolol succinate (TOPROL-XL) 100 MG 24 hr tablet  sucralfate (CARAFATE) 1 g tablet  ONETOUCH ULTRA test strip    Please advise.

## 2019-02-17 MED ORDER — SUCRALFATE 1 G PO TABS: 1.0000 g | ORAL_TABLET | Freq: Two times a day (BID) | ORAL | 3 refills | Status: DC

## 2019-02-17 MED ORDER — METOPROLOL SUCCINATE ER 100 MG PO TB24: 100.0000 mg | ORAL_TABLET | Freq: Every day | ORAL | 3 refills | Status: DC

## 2019-02-24 DIAGNOSIS — J449 Chronic obstructive pulmonary disease, unspecified: Secondary | ICD-10-CM | POA: Diagnosis not present

## 2019-02-24 DIAGNOSIS — R0602 Shortness of breath: Secondary | ICD-10-CM | POA: Diagnosis not present

## 2019-02-26 DIAGNOSIS — J449 Chronic obstructive pulmonary disease, unspecified: Secondary | ICD-10-CM | POA: Diagnosis not present

## 2019-03-05 ENCOUNTER — Other Ambulatory Visit: Payer: Self-pay | Admitting: Family Medicine

## 2019-03-08 ENCOUNTER — Encounter: Payer: Self-pay | Admitting: Adult Health

## 2019-03-08 ENCOUNTER — Ambulatory Visit (INDEPENDENT_AMBULATORY_CARE_PROVIDER_SITE_OTHER): Payer: Medicare Other | Admitting: Adult Health

## 2019-03-08 ENCOUNTER — Other Ambulatory Visit: Payer: Self-pay

## 2019-03-08 VITALS — BP 126/66 | HR 63 | Temp 98.3°F

## 2019-03-08 DIAGNOSIS — Z20822 Contact with and (suspected) exposure to covid-19: Secondary | ICD-10-CM

## 2019-03-08 DIAGNOSIS — R0981 Nasal congestion: Secondary | ICD-10-CM | POA: Diagnosis not present

## 2019-03-08 DIAGNOSIS — Z20828 Contact with and (suspected) exposure to other viral communicable diseases: Secondary | ICD-10-CM

## 2019-03-08 DIAGNOSIS — R062 Wheezing: Secondary | ICD-10-CM | POA: Diagnosis not present

## 2019-03-08 MED ORDER — AMOXICILLIN-POT CLAVULANATE 500-125 MG PO TABS: 1.0000 | ORAL_TABLET | Freq: Two times a day (BID) | ORAL | 0 refills | Status: DC

## 2019-03-08 NOTE — Patient Instructions (Signed)
Amoxicillin; Clavulanic Acid tablets What is this medicine? AMOXICILLIN; CLAVULANIC ACID (a mox i SIL in; KLAV yoo lan ic AS id) is a penicillin antibiotic. It is used to treat certain kinds of bacterial infections. It will not work for colds, flu, or other viral infections. This medicine may be used for other purposes; ask your health care provider or pharmacist if you have questions. COMMON BRAND NAME(S): Augmentin What should I tell my health care provider before I take this medicine? They need to know if you have any of these conditions:  bowel disease, like colitis  kidney disease  liver disease  mononucleosis  an unusual or allergic reaction to amoxicillin, penicillin, cephalosporin, other antibiotics, clavulanic acid, other medicines, foods, dyes, or preservatives  pregnant or trying to get pregnant  breast-feeding How should I use this medicine? Take this medicine by mouth with a full glass of water. Follow the directions on the prescription label. Take at the start of a meal. Do not crush or chew. If the tablet has a score line, you may cut it in half at the score line for easier swallowing. Take your medicine at regular intervals. Do not take your medicine more often than directed. Take all of your medicine as directed even if you think you are better. Do not skip doses or stop your medicine early. Talk to your pediatrician regarding the use of this medicine in children. Special care may be needed. Overdosage: If you think you have taken too much of this medicine contact a poison control center or emergency room at once. NOTE: This medicine is only for you. Do not share this medicine with others. What if I miss a dose? If you miss a dose, take it as soon as you can. If it is almost time for your next dose, take only that dose. Do not take double or extra doses. What may interact with this medicine?  allopurinol  anticoagulants  birth control pills  methotrexate   probenecid This list may not describe all possible interactions. Give your health care provider a list of all the medicines, herbs, non-prescription drugs, or dietary supplements you use. Also tell them if you smoke, drink alcohol, or use illegal drugs. Some items may interact with your medicine. What should I watch for while using this medicine? Tell your doctor or healthcare provider if your symptoms do not improve. This medicine may cause serious skin reactions. They can happen weeks to months after starting the medicine. Contact your healthcare provider right away if you notice fevers or flu-like symptoms with a rash. The rash may be red or purple and then turn into blisters or peeling of the skin. Or, you might notice a red rash with swelling of the face, lips or lymph nodes in your neck or under your arms. Do not treat diarrhea with over the counter products. Contact your doctor if you have diarrhea that lasts more than 2 days or if it is severe and watery. If you have diabetes, you may get a false-positive result for sugar in your urine. Check with your doctor or healthcare provider. Birth control pills may not work properly while you are taking this medicine. Talk to your doctor about using an extra method of birth control. What side effects may I notice from receiving this medicine? Side effects that you should report to your doctor or health care professional as soon as possible:  allergic reactions like skin rash, itching or hives, swelling of the face, lips, or tongue  breathing problems  dark urine  fever or chills, sore throat  redness, blistering, peeling, or loosening of the skin, including inside the mouth  seizures  trouble passing urine or change in the amount of urine  unusual bleeding, bruising  unusually weak or tired  white patches or sores in the mouth or throat Side effects that usually do not require medical attention (report to your doctor or health care  professional if they continue or are bothersome):  diarrhea  dizziness  headache  nausea, vomiting  stomach upset  vaginal or anal irritation This list may not describe all possible side effects. Call your doctor for medical advice about side effects. You may report side effects to FDA at 1-800-FDA-1088. Where should I keep my medicine? Keep out of the reach of children. Store at room temperature below 25 degrees C (77 degrees F). Keep container tightly closed. Throw away any unused medicine after the expiration date. NOTE: This sheet is a summary. It may not cover all possible information. If you have questions about this medicine, talk to your doctor, pharmacist, or health care provider.  2020 Elsevier/Gold Standard (2018-06-06 09:43:46) Sinusitis, Adult Sinusitis is soreness and swelling (inflammation) of your sinuses. Sinuses are hollow spaces in the bones around your face. They are located:  Around your eyes.  In the middle of your forehead.  Behind your nose.  In your cheekbones. Your sinuses and nasal passages are lined with a fluid called mucus. Mucus drains out of your sinuses. Swelling can trap mucus in your sinuses. This lets germs (bacteria, virus, or fungus) grow, which leads to infection. Most of the time, this condition is caused by a virus. What are the causes? This condition is caused by:  Allergies.  Asthma.  Germs.  Things that block your nose or sinuses.  Growths in the nose (nasal polyps).  Chemicals or irritants in the air.  Fungus (rare). What increases the risk? You are more likely to develop this condition if:  You have a weak body defense system (immune system).  You do a lot of swimming or diving.  You use nasal sprays too much.  You smoke. What are the signs or symptoms? The main symptoms of this condition are pain and a feeling of pressure around the sinuses. Other symptoms include:  Stuffy nose (congestion).  Runny nose  (drainage).  Swelling and warmth in the sinuses.  Headache.  Toothache.  A cough that may get worse at night.  Mucus that collects in the throat or the back of the nose (postnasal drip).  Being unable to smell and taste.  Being very tired (fatigue).  A fever.  Sore throat.  Bad breath. How is this diagnosed? This condition is diagnosed based on:  Your symptoms.  Your medical history.  A physical exam.  Tests to find out if your condition is short-term (acute) or long-term (chronic). Your doctor may: ? Check your nose for growths (polyps). ? Check your sinuses using a tool that has a light (endoscope). ? Check for allergies or germs. ? Do imaging tests, such as an MRI or CT scan. How is this treated? Treatment for this condition depends on the cause and whether it is short-term or long-term.  If caused by a virus, your symptoms should go away on their own within 10 days. You may be given medicines to relieve symptoms. They include: ? Medicines that shrink swollen tissue in the nose. ? Medicines that treat allergies (antihistamines). ? A spray that treats swelling  of the nostrils. ? Rinses that help get rid of thick mucus in your nose (nasal saline washes).  If caused by bacteria, your doctor may wait to see if you will get better without treatment. You may be given antibiotic medicine if you have: ? A very bad infection. ? A weak body defense system.  If caused by growths in the nose, you may need to have surgery. Follow these instructions at home: Medicines  Take, use, or apply over-the-counter and prescription medicines only as told by your doctor. These may include nasal sprays.  If you were prescribed an antibiotic medicine, take it as told by your doctor. Do not stop taking the antibiotic even if you start to feel better. Hydrate and humidify   Drink enough water to keep your pee (urine) pale yellow.  Use a cool mist humidifier to keep the humidity  level in your home above 50%.  Breathe in steam for 10-15 minutes, 3-4 times a day, or as told by your doctor. You can do this in the bathroom while a hot shower is running.  Try not to spend time in cool or dry air. Rest  Rest as much as you can.  Sleep with your head raised (elevated).  Make sure you get enough sleep each night. General instructions   Put a warm, moist washcloth on your face 3-4 times a day, or as often as told by your doctor. This will help with discomfort.  Wash your hands often with soap and water. If there is no soap and water, use hand sanitizer.  Do not smoke. Avoid being around people who are smoking (secondhand smoke).  Keep all follow-up visits as told by your doctor. This is important. Contact a doctor if:  You have a fever.  Your symptoms get worse.  Your symptoms do not get better within 10 days. Get help right away if:  You have a very bad headache.  You cannot stop throwing up (vomiting).  You have very bad pain or swelling around your face or eyes.  You have trouble seeing.  You feel confused.  Your neck is stiff.  You have trouble breathing. Summary  Sinusitis is swelling of your sinuses. Sinuses are hollow spaces in the bones around your face.  This condition is caused by tissues in your nose that become inflamed or swollen. This traps germs. These can lead to infection.  If you were prescribed an antibiotic medicine, take it as told by your doctor. Do not stop taking it even if you start to feel better.  Keep all follow-up visits as told by your doctor. This is important. This information is not intended to replace advice given to you by your health care provider. Make sure you discuss any questions you have with your health care provider. Document Released: 09/09/2007 Document Revised: 08/23/2017 Document Reviewed: 08/23/2017 Elsevier Patient Education  [place]. Cough, Adult A cough helps to clear your throat  and lungs. A cough may be a sign of an illness or another medical condition. An acute cough may only last 2-3 weeks, while a chronic cough may last 8 or more weeks. Many things can cause a cough. They include:  Germs (viruses or bacteria) that attack the airway.  Breathing in things that bother (irritate) your lungs.  Allergies.  Asthma.  Mucus that runs down the back of your throat (postnasal drip).  Smoking.  Acid backing up from the stomach into the tube that moves food from the mouth to  the stomach (gastroesophageal reflux).  Some medicines.  Lung problems.  Other medical conditions, such as heart failure or a blood clot in the lung (pulmonary embolism). Follow these instructions at home: Medicines  Take over-the-counter and prescription medicines only as told by your doctor.  Talk with your doctor before you take medicines that stop a cough (coughsuppressants). Lifestyle   Do not smoke, and try not to be around smoke. Do not use any products that contain nicotine or tobacco, such as cigarettes, e-cigarettes, and chewing tobacco. If you need help quitting, ask your doctor.  Drink enough fluid to keep your pee (urine) pale yellow.  Avoid caffeine.  Do not drink alcohol if your doctor tells you not to drink. General instructions   Watch for any changes in your cough. Tell your doctor about them.  Always cover your mouth when you cough.  Stay away from things that make you cough, such as perfume, candles, campfire smoke, or cleaning products.  If the air is dry, use a cool mist vaporizer or humidifier in your home.  If your cough is worse at night, try using extra pillows to raise your head up higher while you sleep.  Rest as needed.  Keep all follow-up visits as told by your doctor. This is important. Contact a doctor if:  You have new symptoms.  You cough up pus.  Your cough does not get better after 2-3 weeks, or your cough gets worse.  Cough  medicine does not help your cough and you are not sleeping well.  You have pain that gets worse or pain that is not helped with medicine.  You have a fever.  You are losing weight and you do not know why.  You have night sweats. Get help right away if:  You cough up blood.  You have trouble breathing.  Your heartbeat is very fast. These symptoms may be an emergency. Do not wait to see if the symptoms will go away. Get medical help right away. Call your local emergency services (911 in the [place] Do not drive yourself to the hospital. Summary  A cough helps to clear your throat and lungs. Many things can cause a cough.  Take over-the-counter and prescription medicines only as told by your doctor.  Always cover your mouth when you cough.  Contact a doctor if you have new symptoms or you have a cough that does not get better or gets worse. This information is not intended to replace advice given to you by your health care provider. Make sure you discuss any questions you have with your health care provider. Document Released: 12/04/2010 Document Revised: 04/11/2018 Document Reviewed: 04/11/2018 Elsevier Patient Education  [place].

## 2019-03-08 NOTE — Progress Notes (Signed)
Patient: [name] [name] Female    DOB: Jun 24, 1940   78 y.o.   MRN: [id number] Visit Date: 03/08/2019  Today's Provider: Marcille Buffy, FNP   Chief Complaint  Patient presents with   Wheezing   Subjective:    Virtual Visit via Telephone Note  I connected with [name] [name] on 03/08/19 at  2:00 PM EST by telephone and verified that I am speaking with the correct person using two identifiers.  Location: Patient: at home  Provider: Provider: Provider's office at  [facility], [place].      I discussed the limitations, risks, security and privacy concerns of performing an evaluation and management service by telephone and the availability of in person appointments. I also discussed with the patient that there may be a patient responsible charge related to this service. The patient expressed understanding and agreed to proceed.   I discussed the assessment and treatment plan with the patient. The patient was provided an opportunity to ask questions and all were answered. The patient agreed with the plan and demonstrated an understanding of the instructions.   The patient was advised to call back or seek an in-person evaluation if the symptoms worsen or if the condition fails to improve as anticipated.  I provided 15 minutes of non-face-to-face time during this encounter.     Wheezing  This is a recurrent problem. The current episode started in the past 7 days. The problem has been unchanged. Associated symptoms include rhinorrhea. Pertinent negatives include no abdominal pain, chest pain, chills, coryza, coughing, diarrhea, ear pain, fever, headaches, hemoptysis, neck pain, rash, shortness of breath, sore throat, sputum production, swollen glands or vomiting. Nothing aggravates the symptoms. Treatments tried: nebulizer treatment. The treatment provided mild relief. Her past medical history is significant for chronic lung disease and COPD.   Her  husband started her Symbicort today and has not been using albuterol or any of her inhalers over the last month and 1 to 2 months, husband reports we should start using them.  She has intermittent wheezing the past 3 days. He reports she has had wheezing 1- 3 times daily for the past 3 days. Not worsening. She did use albuterol nebulizer last night and reports it improved/ resolved.   No current wheezing while provider is on the phone. She has post nasal drip/ congestion for 2-3 weeks. Started coughing the past couple of days, reports the drainage in the back of her throat from nose gets thick at times.  Ears feel stopped up denies any pain in the ears.  Husband denies that patient is having any chest pain, chest tightness, or any distress. History of pneumonia per husband.  She has a history of lung cancer - treated.  No longer  doing treatments.  She is followed by pulmonary.  Denies any GI bleeding. She is eating and drinking well.  He has a history of GI bleed 12/27/2018. Husband denies any new swelling or edema.  Patient  denies any fever, body aches,chills, rash, chest pain, shortness of breath, nausea, vomiting, or diarrhea.    Allergies  Allergen Reactions   Coconut Fatty Acids Swelling and Other (See Comments)    Throat swells     Current Outpatient Medications:    albuterol (VENTOLIN HFA) 108 (90 Base) MCG/ACT inhaler, Inhale 2 puffs into the lungs every 4 (four) hours as needed for wheezing or shortness of breath., Disp: 8 g, Rfl: 11   amLODipine (NORVASC) 5  MG tablet, Take 5 mg by mouth daily. , Disp: , Rfl:    aspirin EC 81 MG EC tablet, Take 1 tablet (81 mg total) by mouth daily., Disp: 30 tablet, Rfl: 0   atorvastatin (LIPITOR) 40 MG tablet, Take 1 tablet (40 mg total) by mouth daily at 6 PM., Disp: 90 tablet, Rfl: 3   budesonide-formoterol (SYMBICORT) 80-4.5 MCG/ACT inhaler, Inhale 2 puffs into the lungs 2 (two) times daily., Disp: 1 Inhaler, Rfl: 2   clopidogrel  (PLAVIX) 75 MG tablet, TAKE 1 TABLET BY MOUTH EVERY DAY (Patient taking differently: Take 75 mg by mouth daily. ), Disp: 90 tablet, Rfl: 1   fluticasone (FLONASE) 50 MCG/ACT nasal spray, Place 2 sprays into both nostrils daily., Disp: 16 g, Rfl: 6   gabapentin (NEURONTIN) 600 MG tablet, Take 0.5 tablets (300 mg total) by mouth 2 (two) times daily., Disp: 180 tablet, Rfl: 3   glimepiride (AMARYL) 2 MG tablet, Take 1 tablet (2 mg total) by mouth 2 (two) times daily., Disp: 180 tablet, Rfl: 3   glucose blood (ONETOUCH ULTRA) test strip, USE TO CHECK BLOOD SUGAR ONCE DAILY, Disp: 100 strip, Rfl: 3   isosorbide mononitrate (IMDUR) 30 MG 24 hr tablet, TAKE 1 TABLET BY MOUTH  DAILY, Disp: 90 tablet, Rfl: 1   LORazepam (ATIVAN) 1 MG tablet, Take 1 tablet (1 mg total) by mouth 2 (two) times daily as needed for anxiety., Disp: 30 tablet, Rfl: 5   magnesium oxide (MAG-OX) 400 (241.3 MG) MG tablet, Take 400 mg by mouth 2 (two) times daily. , Disp: , Rfl:    metFORMIN (GLUCOPHAGE) 500 MG tablet, Take 1 tablet (500 mg total) by mouth 2 (two) times daily with a meal. (Patient taking differently: Take 500 mg by mouth 2 (two) times daily with a meal. Can only tolerate once daily with meal), Disp: 180 tablet, Rfl: 3   metoprolol succinate (TOPROL-XL) 100 MG 24 hr tablet, Take 1 tablet (100 mg total) by mouth daily., Disp: 90 tablet, Rfl: 3   mometasone (ELOCON) 0.1 % lotion, PLACE 4 DROPS INTO EACH EAR CANAL AT BEDTIME AS NEEDED FOR DRY SKIN/ITCHING/DISCOMFORT., Disp: , Rfl:    Multiple Vitamin (MULTIVITAMIN) tablet, Take 0.5 tablets by mouth 2 (two) times daily., Disp: , Rfl:    Multiple Vitamins-Minerals (ICAPS) CAPS, Take 1 capsule by mouth 2 (two) times daily. , Disp: , Rfl:    nitroGLYCERIN (NITROSTAT) 0.4 MG SL tablet, Place 1 tablet (0.4 mg total) under the tongue every 5 (five) minutes as needed for chest pain., Disp: 25 tablet, Rfl: 3   pantoprazole (PROTONIX) 40 MG tablet, TAKE 1 TABLET BY MOUTH  TWICE A DAY, Disp: 180 tablet, Rfl: 1   sucralfate (CARAFATE) 1 g tablet, Take 1 tablet (1 g total) by mouth 2 (two) times daily., Disp: 180 tablet, Rfl: 3   vitamin E (VITAMIN E) 400 UNIT capsule, Take 400 Units by mouth daily., Disp: , Rfl:  No current facility-administered medications for this visit.   Facility-Administered Medications Ordered in Other Visits:    ondansetron (ZOFRAN) 8 mg, dexamethasone (DECADRON) 10 mg in sodium chloride 0.9 % 50 mL IVPB, , Intravenous, Once, Finnegan, Kathlene November, MD  Review of Systems  Constitutional: Negative for activity change, appetite change, chills, diaphoresis, fatigue, fever and unexpected weight change.  HENT: Positive for congestion, postnasal drip, rhinorrhea and sinus pressure. Negative for dental problem, drooling, ear discharge, ear pain, facial swelling, hearing loss, mouth sores, nosebleeds, sneezing, sore throat, tinnitus, trouble swallowing and  voice change.   Respiratory: Positive for wheezing. Negative for apnea, cough, hemoptysis, sputum production, choking, chest tightness, shortness of breath and stridor.   Cardiovascular: Negative for chest pain, palpitations and leg swelling.  Gastrointestinal: Negative.  Negative for abdominal distention, abdominal pain, anal bleeding, blood in stool, constipation, diarrhea, nausea, rectal pain and vomiting.  Genitourinary: Negative.   Musculoskeletal: Negative.  Negative for neck pain.  Skin: Negative for color change, rash and wound.  Neurological: Negative for headaches.  Psychiatric/Behavioral: Negative.     Social History   Tobacco Use   Smoking status: Former Smoker    Packs/day: 1.00    Years: 50.00    Pack years: 50.00    Types: Cigarettes    Quit date: 10/03/2015    Years since quitting: 3.4   Smokeless tobacco: Never Used   Tobacco comment: smokes 3 cigs daily 05/06/15. Pt instructed to quit.  Substance Use Topics   Alcohol use: No    Alcohol/week: 0.0 standard drinks       Objective:   BP 126/66    Pulse 63    Temp 98.3 F (36.8 C) (Oral)    SpO2 92%  Vitals:   03/08/19 1335  BP: 126/66  Pulse: 63  Temp: 98.3 F (36.8 C)  TempSrc: Oral  SpO2: 92%  There is no height or weight on file to calculate BMI.   Physical Exam   Patient is alert and oriented and responsive to questions asked by her husband on phone. Engages in conversation with her spouse who relays messages to providers.  Speaks in full sentences without any pauses without any shortness of breath or distress heard by provider on phone.   Husband denies any distress currently or since onset.    No results found for any visits on 03/08/19.     Assessment & Plan      1. Encounter for laboratory testing for COVID-19 virus Advised Covid testing drive-through at [facility], husband doubts that they will go for testing as he reports it is very hard to get her out of the house and has been for some time.  2. Wheezing Advised husband that patient should be using her Symbicort inhalers as prescribed and using her albuterol inhaler as a rescue inhaler as prescribed when needed. Verbalizes understanding and will start giving them to her daily. It appears by review of chart that inhaler use has been noncompliant at times.  We will avoid any prednisone given her history of recent gastrointestinal bleeding.  3. Sinus congestion  Given patient's sinus congestion for over 2 weeks will give Augmentin, she has tolerated it well in the past, she has no other symptoms of systemic infection no fever no chills no vomiting no nausea. Provider had a long discussion with husband and advised him that patient does have multiple chronic medical problems, including congestive heart failure, COPD, and a history of lung cancer-he is in agreement that should any of her symptoms change or worsen he will call 911 and have her transported for evaluation at the hospital. He prefers to keep her in right now  during the COVID-19 pandemic and not to go to the hospital.  He also is advised that he could should  her pulmonologist and follow-up with them as well since he is she is followed by pulmonary. Husband is appreciative and receptive to advice and will follow instructions did patient have any worsening symptoms.  Meds ordered this encounter  Medications   amoxicillin-clavulanate (AUGMENTIN) 500-125 MG tablet  Sig: Take 1 tablet (500 mg total) by mouth 2 (two) times daily.    Dispense:  20 tablet    Refill:  0    Advised patient call the office or your primary care doctor for an appointment if no improvement within 72 hours or if any symptoms change or worsen at any time  Advised ER or urgent Care if after hours or on weekend. Call 911 for emergency symptoms at any time.Patinet verbalized understanding of all instructions given/reviewed and treatment plan and has no further questions or concerns at this time.     The entirety of the information documented in the History of Present Illness, Review of Systems and Physical Exam were personally obtained by me. Portions of this information were initially documented by the  Certified Medical Assistant whose name is documented in [place] and reviewed by me for thoroughness and accuracy.  I have personally performed the exam and reviewed the chart and it is accurate to the best of my knowledge.  Haematologist has been used and any errors in dictation or transcription are unintentional.  Kelby Aline. [place], [facility] Patient seen by Orma Flaming  note scribed by [employer], [facility]

## 2019-03-09 ENCOUNTER — Other Ambulatory Visit: Payer: Self-pay | Admitting: Adult Health

## 2019-03-09 ENCOUNTER — Telehealth: Payer: Self-pay | Admitting: Family Medicine

## 2019-03-09 MED ORDER — AMOXICILLIN-POT CLAVULANATE 500-125 MG PO TABS: 1.0000 | ORAL_TABLET | Freq: Two times a day (BID) | ORAL | 0 refills | Status: DC

## 2019-03-09 NOTE — Telephone Encounter (Signed)
Thank you. Called pt to let him know Prescriptions have been sent to pharmacy.

## 2019-03-09 NOTE — Telephone Encounter (Signed)
I resent script to pharmacy. Please verify that [name] [name] received his script as well.  My apologies.

## 2019-03-09 NOTE — Telephone Encounter (Signed)
Pt called and stated that Medication amoxicillin-clavulanate (AUGMENTIN) 500-125 MG tablet [[id number]] was not sent into pharmacy.  It says class: print. Please advise

## 2019-03-10 ENCOUNTER — Telehealth: Payer: Self-pay | Admitting: Family Medicine

## 2019-03-10 DIAGNOSIS — M79604 Pain in right leg: Secondary | ICD-10-CM

## 2019-03-10 NOTE — Telephone Encounter (Signed)
How about Home health referral for evaluation.thanks.

## 2019-03-10 NOTE — Telephone Encounter (Signed)
Please advise 

## 2019-03-10 NOTE — Telephone Encounter (Signed)
Spouse requesting home health nursing assistance due to gait being off, offered appointment but PCP has no availability until January, please advise patient directly

## 2019-03-13 NOTE — Telephone Encounter (Signed)
Order in epic. 

## 2019-03-24 ENCOUNTER — Telehealth: Payer: Self-pay | Admitting: Family Medicine

## 2019-03-24 DIAGNOSIS — Z7982 Long term (current) use of aspirin: Secondary | ICD-10-CM | POA: Diagnosis not present

## 2019-03-24 DIAGNOSIS — M6281 Muscle weakness (generalized): Secondary | ICD-10-CM | POA: Diagnosis not present

## 2019-03-24 DIAGNOSIS — M109 Gout, unspecified: Secondary | ICD-10-CM | POA: Diagnosis not present

## 2019-03-24 DIAGNOSIS — E1151 Type 2 diabetes mellitus with diabetic peripheral angiopathy without gangrene: Secondary | ICD-10-CM | POA: Diagnosis not present

## 2019-03-24 DIAGNOSIS — E785 Hyperlipidemia, unspecified: Secondary | ICD-10-CM | POA: Diagnosis not present

## 2019-03-24 DIAGNOSIS — K5900 Constipation, unspecified: Secondary | ICD-10-CM | POA: Diagnosis not present

## 2019-03-24 DIAGNOSIS — I509 Heart failure, unspecified: Secondary | ICD-10-CM | POA: Diagnosis not present

## 2019-03-24 DIAGNOSIS — E538 Deficiency of other specified B group vitamins: Secondary | ICD-10-CM | POA: Diagnosis not present

## 2019-03-24 DIAGNOSIS — C3411 Malignant neoplasm of upper lobe, right bronchus or lung: Secondary | ICD-10-CM | POA: Diagnosis not present

## 2019-03-24 DIAGNOSIS — R21 Rash and other nonspecific skin eruption: Secondary | ICD-10-CM | POA: Diagnosis not present

## 2019-03-24 DIAGNOSIS — I110 Hypertensive heart disease with heart failure: Secondary | ICD-10-CM | POA: Diagnosis not present

## 2019-03-24 DIAGNOSIS — M4722 Other spondylosis with radiculopathy, cervical region: Secondary | ICD-10-CM | POA: Diagnosis not present

## 2019-03-24 DIAGNOSIS — I5033 Acute on chronic diastolic (congestive) heart failure: Secondary | ICD-10-CM | POA: Diagnosis not present

## 2019-03-24 DIAGNOSIS — J441 Chronic obstructive pulmonary disease with (acute) exacerbation: Secondary | ICD-10-CM | POA: Diagnosis not present

## 2019-03-24 DIAGNOSIS — Z8673 Personal history of transient ischemic attack (TIA), and cerebral infarction without residual deficits: Secondary | ICD-10-CM | POA: Diagnosis not present

## 2019-03-24 DIAGNOSIS — I2510 Atherosclerotic heart disease of native coronary artery without angina pectoris: Secondary | ICD-10-CM | POA: Diagnosis not present

## 2019-03-24 DIAGNOSIS — Z7984 Long term (current) use of oral hypoglycemic drugs: Secondary | ICD-10-CM | POA: Diagnosis not present

## 2019-03-24 DIAGNOSIS — Z9181 History of falling: Secondary | ICD-10-CM | POA: Diagnosis not present

## 2019-03-24 DIAGNOSIS — E1142 Type 2 diabetes mellitus with diabetic polyneuropathy: Secondary | ICD-10-CM | POA: Diagnosis not present

## 2019-03-24 DIAGNOSIS — Z7902 Long term (current) use of antithrombotics/antiplatelets: Secondary | ICD-10-CM | POA: Diagnosis not present

## 2019-03-24 DIAGNOSIS — E43 Unspecified severe protein-calorie malnutrition: Secondary | ICD-10-CM | POA: Diagnosis not present

## 2019-03-24 DIAGNOSIS — Z9981 Dependence on supplemental oxygen: Secondary | ICD-10-CM | POA: Diagnosis not present

## 2019-03-24 DIAGNOSIS — Z87891 Personal history of nicotine dependence: Secondary | ICD-10-CM | POA: Diagnosis not present

## 2019-03-24 DIAGNOSIS — I6529 Occlusion and stenosis of unspecified carotid artery: Secondary | ICD-10-CM | POA: Diagnosis not present

## 2019-03-24 NOTE — Telephone Encounter (Signed)
Verbal okay given.  

## 2019-03-24 NOTE — Telephone Encounter (Signed)
Copied from [place] [id number]. Topic: General - Other >> Mar 24, 2019  2:05 PM Rainey Pines A wrote:  Home health nurse called to get verbal orders for nursing 1w3, home health aid 2w4, pt and ot will begin next week and callback with frequency. Home health will start the week after Christmas. Best contact number [phone]

## 2019-03-26 DIAGNOSIS — J449 Chronic obstructive pulmonary disease, unspecified: Secondary | ICD-10-CM | POA: Diagnosis not present

## 2019-03-26 DIAGNOSIS — R0602 Shortness of breath: Secondary | ICD-10-CM | POA: Diagnosis not present

## 2019-03-28 DIAGNOSIS — M109 Gout, unspecified: Secondary | ICD-10-CM | POA: Diagnosis not present

## 2019-03-28 DIAGNOSIS — E538 Deficiency of other specified B group vitamins: Secondary | ICD-10-CM | POA: Diagnosis not present

## 2019-03-28 DIAGNOSIS — Z87891 Personal history of nicotine dependence: Secondary | ICD-10-CM | POA: Diagnosis not present

## 2019-03-28 DIAGNOSIS — I110 Hypertensive heart disease with heart failure: Secondary | ICD-10-CM | POA: Diagnosis not present

## 2019-03-28 DIAGNOSIS — K5900 Constipation, unspecified: Secondary | ICD-10-CM | POA: Diagnosis not present

## 2019-03-28 DIAGNOSIS — E1151 Type 2 diabetes mellitus with diabetic peripheral angiopathy without gangrene: Secondary | ICD-10-CM | POA: Diagnosis not present

## 2019-03-28 DIAGNOSIS — I2510 Atherosclerotic heart disease of native coronary artery without angina pectoris: Secondary | ICD-10-CM | POA: Diagnosis not present

## 2019-03-28 DIAGNOSIS — R21 Rash and other nonspecific skin eruption: Secondary | ICD-10-CM | POA: Diagnosis not present

## 2019-03-28 DIAGNOSIS — M6281 Muscle weakness (generalized): Secondary | ICD-10-CM | POA: Diagnosis not present

## 2019-03-28 DIAGNOSIS — I6529 Occlusion and stenosis of unspecified carotid artery: Secondary | ICD-10-CM | POA: Diagnosis not present

## 2019-03-28 DIAGNOSIS — Z7982 Long term (current) use of aspirin: Secondary | ICD-10-CM | POA: Diagnosis not present

## 2019-03-28 DIAGNOSIS — Z7902 Long term (current) use of antithrombotics/antiplatelets: Secondary | ICD-10-CM | POA: Diagnosis not present

## 2019-03-28 DIAGNOSIS — E1142 Type 2 diabetes mellitus with diabetic polyneuropathy: Secondary | ICD-10-CM | POA: Diagnosis not present

## 2019-03-28 DIAGNOSIS — E43 Unspecified severe protein-calorie malnutrition: Secondary | ICD-10-CM | POA: Diagnosis not present

## 2019-03-28 DIAGNOSIS — J441 Chronic obstructive pulmonary disease with (acute) exacerbation: Secondary | ICD-10-CM | POA: Diagnosis not present

## 2019-03-28 DIAGNOSIS — E785 Hyperlipidemia, unspecified: Secondary | ICD-10-CM | POA: Diagnosis not present

## 2019-03-28 DIAGNOSIS — Z7984 Long term (current) use of oral hypoglycemic drugs: Secondary | ICD-10-CM | POA: Diagnosis not present

## 2019-03-28 DIAGNOSIS — J449 Chronic obstructive pulmonary disease, unspecified: Secondary | ICD-10-CM | POA: Diagnosis not present

## 2019-03-28 DIAGNOSIS — I509 Heart failure, unspecified: Secondary | ICD-10-CM | POA: Diagnosis not present

## 2019-03-28 DIAGNOSIS — Z9981 Dependence on supplemental oxygen: Secondary | ICD-10-CM | POA: Diagnosis not present

## 2019-03-28 DIAGNOSIS — Z8673 Personal history of transient ischemic attack (TIA), and cerebral infarction without residual deficits: Secondary | ICD-10-CM | POA: Diagnosis not present

## 2019-03-28 DIAGNOSIS — M4722 Other spondylosis with radiculopathy, cervical region: Secondary | ICD-10-CM | POA: Diagnosis not present

## 2019-03-28 DIAGNOSIS — C3411 Malignant neoplasm of upper lobe, right bronchus or lung: Secondary | ICD-10-CM | POA: Diagnosis not present

## 2019-03-28 DIAGNOSIS — Z9181 History of falling: Secondary | ICD-10-CM | POA: Diagnosis not present

## 2019-03-28 DIAGNOSIS — I5033 Acute on chronic diastolic (congestive) heart failure: Secondary | ICD-10-CM | POA: Diagnosis not present

## 2019-03-28 NOTE — Progress Notes (Signed)
Patient: [name] [name] Female    DOB: 12-Apr-1940   78 y.o.   MRN: [id number] Visit Date: 04/03/2019  Today's Provider: [name], MD   Chief Complaint  Patient presents with  . Follow-up  . Otalgia   Subjective:     The patient is following up on leg neuropathy. She starts PT Wednesday 04/05/2019 She is not able to move around right now very good.  Husband is present during visit.  Patient is able to get up and go to the restroom at home but does not move otherwise.  She can move her legs.  They just are weak and want to give way. They have a whole list of issues they want addressed today.  2 of them involved face-to-face, 1 is for a smaller walker and the other is for diabetic shoes. There actually is a third face-to-face requesting this is for mobility chair.  I think these issues will have to be addressed depending on how she responds to physical therapy. They also state that she is having itching in her left ear and a rash under her left breast. Allergies  Allergen Reactions  . Coconut Fatty Acids Swelling and Other (See Comments)    Throat swells     Current Outpatient Medications:  .  albuterol (VENTOLIN HFA) 108 (90 Base) MCG/ACT inhaler, Inhale 2 puffs into the lungs every 4 (four) hours as needed for wheezing or shortness of breath., Disp: 8 g, Rfl: 11 .  amLODipine (NORVASC) 5 MG tablet, Take 5 mg by mouth daily. , Disp: , Rfl:  .  aspirin EC 81 MG EC tablet, Take 1 tablet (81 mg total) by mouth daily., Disp: 30 tablet, Rfl: 0 .  atorvastatin (LIPITOR) 40 MG tablet, Take 1 tablet (40 mg total) by mouth daily at 6 PM., Disp: 90 tablet, Rfl: 3 .  budesonide-formoterol (SYMBICORT) 80-4.5 MCG/ACT inhaler, Inhale 2 puffs into the lungs 2 (two) times daily., Disp: 1 Inhaler, Rfl: 2 .  clopidogrel (PLAVIX) 75 MG tablet, TAKE 1 TABLET BY MOUTH EVERY DAY (Patient taking differently: Take 75 mg by mouth daily. ), Disp: 90 tablet, Rfl: 1 .  fluticasone (FLONASE) 50  MCG/ACT nasal spray, Place 2 sprays into both nostrils daily., Disp: 16 g, Rfl: 6 .  gabapentin (NEURONTIN) 600 MG tablet, Take 0.5 tablets (300 mg total) by mouth 2 (two) times daily., Disp: 180 tablet, Rfl: 3 .  glimepiride (AMARYL) 2 MG tablet, Take 1 tablet (2 mg total) by mouth 2 (two) times daily., Disp: 180 tablet, Rfl: 3 .  glucose blood (ONETOUCH ULTRA) test strip, USE TO CHECK BLOOD SUGAR ONCE DAILY, Disp: 100 strip, Rfl: 3 .  isosorbide mononitrate (IMDUR) 30 MG 24 hr tablet, TAKE 1 TABLET BY MOUTH  DAILY, Disp: 90 tablet, Rfl: 1 .  LORazepam (ATIVAN) 1 MG tablet, Take 1 tablet (1 mg total) by mouth 2 (two) times daily as needed for anxiety., Disp: 30 tablet, Rfl: 5 .  magnesium oxide (MAG-OX) 400 (241.3 MG) MG tablet, Take 400 mg by mouth 2 (two) times daily. , Disp: , Rfl:  .  metFORMIN (GLUCOPHAGE) 500 MG tablet, Take 1 tablet (500 mg total) by mouth 2 (two) times daily with a meal. (Patient taking differently: Take 500 mg by mouth 2 (two) times daily with a meal. Can only tolerate once daily with meal), Disp: 180 tablet, Rfl: 3 .  metoprolol succinate (TOPROL-XL) 100 MG 24 hr tablet, Take 1 tablet (100 mg  total) by mouth daily., Disp: 90 tablet, Rfl: 3 .  mometasone (ELOCON) 0.1 % lotion, PLACE 4 DROPS INTO EACH EAR CANAL AT BEDTIME AS NEEDED FOR DRY SKIN/ITCHING/DISCOMFORT., Disp: , Rfl:  .  Multiple Vitamin (MULTIVITAMIN) tablet, Take 0.5 tablets by mouth 2 (two) times daily., Disp: , Rfl:  .  Multiple Vitamins-Minerals (ICAPS) CAPS, Take 1 capsule by mouth 2 (two) times daily. , Disp: , Rfl:  .  nitroGLYCERIN (NITROSTAT) 0.4 MG SL tablet, Place 1 tablet (0.4 mg total) under the tongue every 5 (five) minutes as needed for chest pain., Disp: 25 tablet, Rfl: 3 .  nystatin (MYCOSTATIN/NYSTOP) powder, Apply 1 application topically 3 (three) times daily., Disp: 15 g, Rfl: 0 .  pantoprazole (PROTONIX) 40 MG tablet, TAKE 1 TABLET BY MOUTH TWICE A DAY, Disp: 180 tablet, Rfl: 1 .  sucralfate  (CARAFATE) 1 g tablet, Take 1 tablet (1 g total) by mouth 2 (two) times daily., Disp: 180 tablet, Rfl: 3 .  vitamin E (VITAMIN E) 400 UNIT capsule, Take 400 Units by mouth daily., Disp: , Rfl:  No current facility-administered medications for this visit.  Facility-Administered Medications Ordered in Other Visits:  .  ondansetron (ZOFRAN) 8 mg, dexamethasone (DECADRON) 10 mg in sodium chloride 0.9 % 50 mL IVPB, , Intravenous, Once, Finnegan, Kathlene November, MD  Review of Systems  Constitutional: Negative for appetite change, chills, fatigue and fever.  HENT: Positive for ear pain.   Eyes: Negative.   Respiratory: Negative for chest tightness and shortness of breath.   Cardiovascular: Negative for chest pain and palpitations.  Gastrointestinal: Negative for abdominal pain, nausea and vomiting.  Endocrine: Negative.   Musculoskeletal: Positive for back pain and gait problem.  Skin: Negative.   Allergic/Immunologic: Negative.   Neurological: Positive for weakness. Negative for dizziness.  Hematological: Negative.   Psychiatric/Behavioral: Negative.     Social History   Tobacco Use  . Smoking status: Former Smoker    Packs/day: 1.00    Years: 50.00    Pack years: 50.00    Types: Cigarettes    Quit date: 10/03/2015    Years since quitting: 3.5  . Smokeless tobacco: Never Used  . Tobacco comment: smokes 3 cigs daily 05/06/15. Pt instructed to quit.  Substance Use Topics  . Alcohol use: No    Alcohol/week: 0.0 standard drinks      Objective:   BP (!) 146/71   Pulse (!) 57   Temp (!) 96.8 F (36 C) (Temporal)   Resp 18   Ht 5\' 4"  (1.626 m)   Wt 135 lb (61.2 kg)   SpO2 94%   BMI 23.17 kg/m  Vitals:   04/03/19 1344  BP: (!) 146/71  Pulse: (!) 57  Resp: 18  Temp: (!) 96.8 F (36 C)  TempSrc: Temporal  SpO2: 94%  Weight: 135 lb (61.2 kg)  Height: 5\' 4"  (1.626 m)  Body mass index is 23.17 kg/m.   Physical Exam Vitals reviewed.  Constitutional:      Appearance: She is  well-developed.     Comments: Patient sitting in wheelchair due to chronic weakness.  HENT:     Head: Normocephalic and atraumatic.     Right Ear: External ear normal.     Left Ear: External ear normal.     Nose: Nose normal.  Eyes:     General: No scleral icterus.    Conjunctiva/sclera: Conjunctivae normal.  Neck:     Thyroid: No thyromegaly.  Cardiovascular:     Rate and  Rhythm: Normal rate and regular rhythm.     Heart sounds: Normal heart sounds.  Pulmonary:     Effort: Pulmonary effort is normal.     Breath sounds: Normal breath sounds.  Abdominal:     Palpations: Abdomen is soft.  Musculoskeletal:     Right lower leg: No edema.     Left lower leg: No edema.  Skin:    General: Skin is warm and dry.  Neurological:     Mental Status: She is alert and oriented to person, place, and time. Mental status is at baseline.     Comments: She is able to move both lower extremities.  I did not have her get up out of the wheelchair to walk.  Psychiatric:        Mood and Affect: Mood normal.        Behavior: Behavior normal.        Thought Content: Thought content normal.        Judgment: Judgment normal.      No results found for any visits on 04/03/19.     Assessment & Plan     1. Weakness of both legs I think this is due to deconditioning and overall disease burden.  Physical therapy starts in 2 days.  On her next visit we need to talk about DNR/ MOST forms Walker or wheelchair will be appropriate.  We will see how she does with physical therapy 2. Eczema of both external ears Treat with triamcinolone cream at bedtime for a while.  There is no rash on her breast. - triamcinolone ointment (KENALOG) 0.5 %; Apply 1 application topically every evening for 5 doses. Apply a Pea size to the outer ear at bedtime.  Dispense: 15 g; Refill: 1  3. Pain in both lower extremities   4. CAD in native artery All risk factors treated  5. Essential (primary) hypertension   6.  Diabetic polyneuropathy associated with type 2 diabetes mellitus ([place]) Diabetic shoes are appropriate if patient is able to get back to ambulating.  7. Protein-calorie malnutrition, severe   8. Malignant neoplasm of upper lobe of right lung ([place]) Followed by oncology.More than 50% of  25 minute visit spent in counseling or coordination of care      [name], MD  [facility]

## 2019-03-29 ENCOUNTER — Telehealth: Payer: Self-pay

## 2019-03-29 DIAGNOSIS — Z9181 History of falling: Secondary | ICD-10-CM | POA: Diagnosis not present

## 2019-03-29 DIAGNOSIS — I110 Hypertensive heart disease with heart failure: Secondary | ICD-10-CM | POA: Diagnosis not present

## 2019-03-29 DIAGNOSIS — Z7984 Long term (current) use of oral hypoglycemic drugs: Secondary | ICD-10-CM | POA: Diagnosis not present

## 2019-03-29 DIAGNOSIS — M4722 Other spondylosis with radiculopathy, cervical region: Secondary | ICD-10-CM | POA: Diagnosis not present

## 2019-03-29 DIAGNOSIS — E538 Deficiency of other specified B group vitamins: Secondary | ICD-10-CM | POA: Diagnosis not present

## 2019-03-29 DIAGNOSIS — E1151 Type 2 diabetes mellitus with diabetic peripheral angiopathy without gangrene: Secondary | ICD-10-CM | POA: Diagnosis not present

## 2019-03-29 DIAGNOSIS — C3411 Malignant neoplasm of upper lobe, right bronchus or lung: Secondary | ICD-10-CM | POA: Diagnosis not present

## 2019-03-29 DIAGNOSIS — I2510 Atherosclerotic heart disease of native coronary artery without angina pectoris: Secondary | ICD-10-CM | POA: Diagnosis not present

## 2019-03-29 DIAGNOSIS — I5033 Acute on chronic diastolic (congestive) heart failure: Secondary | ICD-10-CM | POA: Diagnosis not present

## 2019-03-29 DIAGNOSIS — E1142 Type 2 diabetes mellitus with diabetic polyneuropathy: Secondary | ICD-10-CM | POA: Diagnosis not present

## 2019-03-29 DIAGNOSIS — E43 Unspecified severe protein-calorie malnutrition: Secondary | ICD-10-CM | POA: Diagnosis not present

## 2019-03-29 DIAGNOSIS — B372 Candidiasis of skin and nail: Secondary | ICD-10-CM

## 2019-03-29 DIAGNOSIS — Z7902 Long term (current) use of antithrombotics/antiplatelets: Secondary | ICD-10-CM | POA: Diagnosis not present

## 2019-03-29 DIAGNOSIS — K5900 Constipation, unspecified: Secondary | ICD-10-CM | POA: Diagnosis not present

## 2019-03-29 DIAGNOSIS — M6281 Muscle weakness (generalized): Secondary | ICD-10-CM | POA: Diagnosis not present

## 2019-03-29 DIAGNOSIS — M109 Gout, unspecified: Secondary | ICD-10-CM | POA: Diagnosis not present

## 2019-03-29 DIAGNOSIS — Z8673 Personal history of transient ischemic attack (TIA), and cerebral infarction without residual deficits: Secondary | ICD-10-CM | POA: Diagnosis not present

## 2019-03-29 DIAGNOSIS — R21 Rash and other nonspecific skin eruption: Secondary | ICD-10-CM | POA: Diagnosis not present

## 2019-03-29 DIAGNOSIS — I509 Heart failure, unspecified: Secondary | ICD-10-CM | POA: Diagnosis not present

## 2019-03-29 DIAGNOSIS — E785 Hyperlipidemia, unspecified: Secondary | ICD-10-CM | POA: Diagnosis not present

## 2019-03-29 DIAGNOSIS — Z7982 Long term (current) use of aspirin: Secondary | ICD-10-CM | POA: Diagnosis not present

## 2019-03-29 DIAGNOSIS — I6529 Occlusion and stenosis of unspecified carotid artery: Secondary | ICD-10-CM | POA: Diagnosis not present

## 2019-03-29 DIAGNOSIS — Z9981 Dependence on supplemental oxygen: Secondary | ICD-10-CM | POA: Diagnosis not present

## 2019-03-29 DIAGNOSIS — Z87891 Personal history of nicotine dependence: Secondary | ICD-10-CM | POA: Diagnosis not present

## 2019-03-29 DIAGNOSIS — J441 Chronic obstructive pulmonary disease with (acute) exacerbation: Secondary | ICD-10-CM | POA: Diagnosis not present

## 2019-03-29 MED ORDER — NYSTATIN 100000 UNIT/GM EX POWD: 1.0000 "application " | Freq: Three times a day (TID) | CUTANEOUS | 0 refills | Status: DC

## 2019-03-29 NOTE — Telephone Encounter (Signed)
Nystatin powder sent.

## 2019-03-29 NOTE — Addendum Note (Signed)
Addended by: Trinna Post on: 03/29/2019 02:49 PM   Modules accepted: Orders

## 2019-03-29 NOTE — Telephone Encounter (Signed)
Copied from [name] [name] [id number]. Topic: General - Other >> Mar 29, 2019  2:04 PM Rainey Pines A wrote: Patient has yeast under left breast and Olin Hauser from [facility] would like to know if nystatin can be called in for patient. Best contact is [phone]

## 2019-04-03 ENCOUNTER — Ambulatory Visit (INDEPENDENT_AMBULATORY_CARE_PROVIDER_SITE_OTHER): Payer: Medicare Other | Admitting: Family Medicine

## 2019-04-03 ENCOUNTER — Other Ambulatory Visit: Payer: Self-pay

## 2019-04-03 ENCOUNTER — Encounter: Payer: Self-pay | Admitting: Family Medicine

## 2019-04-03 VITALS — BP 146/71 | HR 57 | Temp 96.8°F | Resp 18 | Ht 64.0 in | Wt 135.0 lb

## 2019-04-03 DIAGNOSIS — Z9181 History of falling: Secondary | ICD-10-CM | POA: Diagnosis not present

## 2019-04-03 DIAGNOSIS — E1142 Type 2 diabetes mellitus with diabetic polyneuropathy: Secondary | ICD-10-CM | POA: Diagnosis not present

## 2019-04-03 DIAGNOSIS — Z87891 Personal history of nicotine dependence: Secondary | ICD-10-CM | POA: Diagnosis not present

## 2019-04-03 DIAGNOSIS — K5900 Constipation, unspecified: Secondary | ICD-10-CM | POA: Diagnosis not present

## 2019-04-03 DIAGNOSIS — I2510 Atherosclerotic heart disease of native coronary artery without angina pectoris: Secondary | ICD-10-CM | POA: Diagnosis not present

## 2019-04-03 DIAGNOSIS — E43 Unspecified severe protein-calorie malnutrition: Secondary | ICD-10-CM

## 2019-04-03 DIAGNOSIS — I6529 Occlusion and stenosis of unspecified carotid artery: Secondary | ICD-10-CM | POA: Diagnosis not present

## 2019-04-03 DIAGNOSIS — R21 Rash and other nonspecific skin eruption: Secondary | ICD-10-CM | POA: Diagnosis not present

## 2019-04-03 DIAGNOSIS — M79604 Pain in right leg: Secondary | ICD-10-CM

## 2019-04-03 DIAGNOSIS — J441 Chronic obstructive pulmonary disease with (acute) exacerbation: Secondary | ICD-10-CM | POA: Diagnosis not present

## 2019-04-03 DIAGNOSIS — I10 Essential (primary) hypertension: Secondary | ICD-10-CM

## 2019-04-03 DIAGNOSIS — H60543 Acute eczematoid otitis externa, bilateral: Secondary | ICD-10-CM

## 2019-04-03 DIAGNOSIS — M109 Gout, unspecified: Secondary | ICD-10-CM | POA: Diagnosis not present

## 2019-04-03 DIAGNOSIS — Z9981 Dependence on supplemental oxygen: Secondary | ICD-10-CM | POA: Diagnosis not present

## 2019-04-03 DIAGNOSIS — E785 Hyperlipidemia, unspecified: Secondary | ICD-10-CM | POA: Diagnosis not present

## 2019-04-03 DIAGNOSIS — R29898 Other symptoms and signs involving the musculoskeletal system: Secondary | ICD-10-CM

## 2019-04-03 DIAGNOSIS — M79605 Pain in left leg: Secondary | ICD-10-CM

## 2019-04-03 DIAGNOSIS — Z7982 Long term (current) use of aspirin: Secondary | ICD-10-CM | POA: Diagnosis not present

## 2019-04-03 DIAGNOSIS — I509 Heart failure, unspecified: Secondary | ICD-10-CM | POA: Diagnosis not present

## 2019-04-03 DIAGNOSIS — M6281 Muscle weakness (generalized): Secondary | ICD-10-CM | POA: Diagnosis not present

## 2019-04-03 DIAGNOSIS — Z8673 Personal history of transient ischemic attack (TIA), and cerebral infarction without residual deficits: Secondary | ICD-10-CM | POA: Diagnosis not present

## 2019-04-03 DIAGNOSIS — I110 Hypertensive heart disease with heart failure: Secondary | ICD-10-CM | POA: Diagnosis not present

## 2019-04-03 DIAGNOSIS — Z7902 Long term (current) use of antithrombotics/antiplatelets: Secondary | ICD-10-CM | POA: Diagnosis not present

## 2019-04-03 DIAGNOSIS — E1151 Type 2 diabetes mellitus with diabetic peripheral angiopathy without gangrene: Secondary | ICD-10-CM | POA: Diagnosis not present

## 2019-04-03 DIAGNOSIS — C3411 Malignant neoplasm of upper lobe, right bronchus or lung: Secondary | ICD-10-CM | POA: Diagnosis not present

## 2019-04-03 DIAGNOSIS — M4722 Other spondylosis with radiculopathy, cervical region: Secondary | ICD-10-CM | POA: Diagnosis not present

## 2019-04-03 DIAGNOSIS — Z7984 Long term (current) use of oral hypoglycemic drugs: Secondary | ICD-10-CM | POA: Diagnosis not present

## 2019-04-03 DIAGNOSIS — I5033 Acute on chronic diastolic (congestive) heart failure: Secondary | ICD-10-CM | POA: Diagnosis not present

## 2019-04-03 DIAGNOSIS — E538 Deficiency of other specified B group vitamins: Secondary | ICD-10-CM | POA: Diagnosis not present

## 2019-04-03 MED ORDER — TRIAMCINOLONE ACETONIDE 0.5 % EX OINT: 1.0000 "application " | TOPICAL_OINTMENT | Freq: Every evening | CUTANEOUS | 1 refills | Status: AC

## 2019-04-05 DIAGNOSIS — I509 Heart failure, unspecified: Secondary | ICD-10-CM | POA: Diagnosis not present

## 2019-04-05 DIAGNOSIS — Z9181 History of falling: Secondary | ICD-10-CM | POA: Diagnosis not present

## 2019-04-05 DIAGNOSIS — I5033 Acute on chronic diastolic (congestive) heart failure: Secondary | ICD-10-CM | POA: Diagnosis not present

## 2019-04-05 DIAGNOSIS — R21 Rash and other nonspecific skin eruption: Secondary | ICD-10-CM | POA: Diagnosis not present

## 2019-04-05 DIAGNOSIS — J441 Chronic obstructive pulmonary disease with (acute) exacerbation: Secondary | ICD-10-CM | POA: Diagnosis not present

## 2019-04-05 DIAGNOSIS — M109 Gout, unspecified: Secondary | ICD-10-CM | POA: Diagnosis not present

## 2019-04-05 DIAGNOSIS — E785 Hyperlipidemia, unspecified: Secondary | ICD-10-CM | POA: Diagnosis not present

## 2019-04-05 DIAGNOSIS — K5900 Constipation, unspecified: Secondary | ICD-10-CM | POA: Diagnosis not present

## 2019-04-05 DIAGNOSIS — E1142 Type 2 diabetes mellitus with diabetic polyneuropathy: Secondary | ICD-10-CM | POA: Diagnosis not present

## 2019-04-05 DIAGNOSIS — E1151 Type 2 diabetes mellitus with diabetic peripheral angiopathy without gangrene: Secondary | ICD-10-CM | POA: Diagnosis not present

## 2019-04-05 DIAGNOSIS — E538 Deficiency of other specified B group vitamins: Secondary | ICD-10-CM | POA: Diagnosis not present

## 2019-04-05 DIAGNOSIS — M6281 Muscle weakness (generalized): Secondary | ICD-10-CM | POA: Diagnosis not present

## 2019-04-05 DIAGNOSIS — Z8673 Personal history of transient ischemic attack (TIA), and cerebral infarction without residual deficits: Secondary | ICD-10-CM | POA: Diagnosis not present

## 2019-04-05 DIAGNOSIS — C3411 Malignant neoplasm of upper lobe, right bronchus or lung: Secondary | ICD-10-CM | POA: Diagnosis not present

## 2019-04-05 DIAGNOSIS — I2510 Atherosclerotic heart disease of native coronary artery without angina pectoris: Secondary | ICD-10-CM | POA: Diagnosis not present

## 2019-04-05 DIAGNOSIS — M4722 Other spondylosis with radiculopathy, cervical region: Secondary | ICD-10-CM | POA: Diagnosis not present

## 2019-04-05 DIAGNOSIS — Z7982 Long term (current) use of aspirin: Secondary | ICD-10-CM | POA: Diagnosis not present

## 2019-04-05 DIAGNOSIS — Z87891 Personal history of nicotine dependence: Secondary | ICD-10-CM | POA: Diagnosis not present

## 2019-04-05 DIAGNOSIS — I6529 Occlusion and stenosis of unspecified carotid artery: Secondary | ICD-10-CM | POA: Diagnosis not present

## 2019-04-05 DIAGNOSIS — Z7902 Long term (current) use of antithrombotics/antiplatelets: Secondary | ICD-10-CM | POA: Diagnosis not present

## 2019-04-05 DIAGNOSIS — I110 Hypertensive heart disease with heart failure: Secondary | ICD-10-CM | POA: Diagnosis not present

## 2019-04-05 DIAGNOSIS — Z9981 Dependence on supplemental oxygen: Secondary | ICD-10-CM | POA: Diagnosis not present

## 2019-04-05 DIAGNOSIS — Z7984 Long term (current) use of oral hypoglycemic drugs: Secondary | ICD-10-CM | POA: Diagnosis not present

## 2019-04-05 DIAGNOSIS — E43 Unspecified severe protein-calorie malnutrition: Secondary | ICD-10-CM | POA: Diagnosis not present

## 2019-04-06 DIAGNOSIS — Z9181 History of falling: Secondary | ICD-10-CM | POA: Diagnosis not present

## 2019-04-06 DIAGNOSIS — I509 Heart failure, unspecified: Secondary | ICD-10-CM | POA: Diagnosis not present

## 2019-04-06 DIAGNOSIS — J441 Chronic obstructive pulmonary disease with (acute) exacerbation: Secondary | ICD-10-CM | POA: Diagnosis not present

## 2019-04-06 DIAGNOSIS — Z9981 Dependence on supplemental oxygen: Secondary | ICD-10-CM | POA: Diagnosis not present

## 2019-04-06 DIAGNOSIS — Z7984 Long term (current) use of oral hypoglycemic drugs: Secondary | ICD-10-CM | POA: Diagnosis not present

## 2019-04-06 DIAGNOSIS — E43 Unspecified severe protein-calorie malnutrition: Secondary | ICD-10-CM | POA: Diagnosis not present

## 2019-04-06 DIAGNOSIS — E1151 Type 2 diabetes mellitus with diabetic peripheral angiopathy without gangrene: Secondary | ICD-10-CM | POA: Diagnosis not present

## 2019-04-06 DIAGNOSIS — Z7902 Long term (current) use of antithrombotics/antiplatelets: Secondary | ICD-10-CM | POA: Diagnosis not present

## 2019-04-06 DIAGNOSIS — I110 Hypertensive heart disease with heart failure: Secondary | ICD-10-CM | POA: Diagnosis not present

## 2019-04-06 DIAGNOSIS — M109 Gout, unspecified: Secondary | ICD-10-CM | POA: Diagnosis not present

## 2019-04-06 DIAGNOSIS — I5033 Acute on chronic diastolic (congestive) heart failure: Secondary | ICD-10-CM | POA: Diagnosis not present

## 2019-04-06 DIAGNOSIS — I6529 Occlusion and stenosis of unspecified carotid artery: Secondary | ICD-10-CM | POA: Diagnosis not present

## 2019-04-06 DIAGNOSIS — Z7982 Long term (current) use of aspirin: Secondary | ICD-10-CM | POA: Diagnosis not present

## 2019-04-06 DIAGNOSIS — E1142 Type 2 diabetes mellitus with diabetic polyneuropathy: Secondary | ICD-10-CM | POA: Diagnosis not present

## 2019-04-06 DIAGNOSIS — M6281 Muscle weakness (generalized): Secondary | ICD-10-CM | POA: Diagnosis not present

## 2019-04-06 DIAGNOSIS — M4722 Other spondylosis with radiculopathy, cervical region: Secondary | ICD-10-CM | POA: Diagnosis not present

## 2019-04-06 DIAGNOSIS — Z87891 Personal history of nicotine dependence: Secondary | ICD-10-CM | POA: Diagnosis not present

## 2019-04-06 DIAGNOSIS — E538 Deficiency of other specified B group vitamins: Secondary | ICD-10-CM | POA: Diagnosis not present

## 2019-04-06 DIAGNOSIS — I2510 Atherosclerotic heart disease of native coronary artery without angina pectoris: Secondary | ICD-10-CM | POA: Diagnosis not present

## 2019-04-06 DIAGNOSIS — Z8673 Personal history of transient ischemic attack (TIA), and cerebral infarction without residual deficits: Secondary | ICD-10-CM | POA: Diagnosis not present

## 2019-04-06 DIAGNOSIS — C3411 Malignant neoplasm of upper lobe, right bronchus or lung: Secondary | ICD-10-CM | POA: Diagnosis not present

## 2019-04-06 DIAGNOSIS — E785 Hyperlipidemia, unspecified: Secondary | ICD-10-CM | POA: Diagnosis not present

## 2019-04-06 DIAGNOSIS — R21 Rash and other nonspecific skin eruption: Secondary | ICD-10-CM | POA: Diagnosis not present

## 2019-04-06 DIAGNOSIS — K5900 Constipation, unspecified: Secondary | ICD-10-CM | POA: Diagnosis not present

## 2019-04-07 DIAGNOSIS — E785 Hyperlipidemia, unspecified: Secondary | ICD-10-CM | POA: Diagnosis not present

## 2019-04-07 DIAGNOSIS — E43 Unspecified severe protein-calorie malnutrition: Secondary | ICD-10-CM | POA: Diagnosis not present

## 2019-04-07 DIAGNOSIS — Z8673 Personal history of transient ischemic attack (TIA), and cerebral infarction without residual deficits: Secondary | ICD-10-CM | POA: Diagnosis not present

## 2019-04-07 DIAGNOSIS — Z9181 History of falling: Secondary | ICD-10-CM | POA: Diagnosis not present

## 2019-04-07 DIAGNOSIS — Z87891 Personal history of nicotine dependence: Secondary | ICD-10-CM | POA: Diagnosis not present

## 2019-04-07 DIAGNOSIS — Z7984 Long term (current) use of oral hypoglycemic drugs: Secondary | ICD-10-CM | POA: Diagnosis not present

## 2019-04-07 DIAGNOSIS — I5033 Acute on chronic diastolic (congestive) heart failure: Secondary | ICD-10-CM | POA: Diagnosis not present

## 2019-04-07 DIAGNOSIS — E1151 Type 2 diabetes mellitus with diabetic peripheral angiopathy without gangrene: Secondary | ICD-10-CM | POA: Diagnosis not present

## 2019-04-07 DIAGNOSIS — J441 Chronic obstructive pulmonary disease with (acute) exacerbation: Secondary | ICD-10-CM | POA: Diagnosis not present

## 2019-04-07 DIAGNOSIS — Z9981 Dependence on supplemental oxygen: Secondary | ICD-10-CM | POA: Diagnosis not present

## 2019-04-07 DIAGNOSIS — Z7902 Long term (current) use of antithrombotics/antiplatelets: Secondary | ICD-10-CM | POA: Diagnosis not present

## 2019-04-07 DIAGNOSIS — C3411 Malignant neoplasm of upper lobe, right bronchus or lung: Secondary | ICD-10-CM | POA: Diagnosis not present

## 2019-04-07 DIAGNOSIS — M4722 Other spondylosis with radiculopathy, cervical region: Secondary | ICD-10-CM | POA: Diagnosis not present

## 2019-04-07 DIAGNOSIS — M109 Gout, unspecified: Secondary | ICD-10-CM | POA: Diagnosis not present

## 2019-04-07 DIAGNOSIS — I2510 Atherosclerotic heart disease of native coronary artery without angina pectoris: Secondary | ICD-10-CM | POA: Diagnosis not present

## 2019-04-07 DIAGNOSIS — M6281 Muscle weakness (generalized): Secondary | ICD-10-CM | POA: Diagnosis not present

## 2019-04-07 DIAGNOSIS — E538 Deficiency of other specified B group vitamins: Secondary | ICD-10-CM | POA: Diagnosis not present

## 2019-04-07 DIAGNOSIS — I110 Hypertensive heart disease with heart failure: Secondary | ICD-10-CM | POA: Diagnosis not present

## 2019-04-07 DIAGNOSIS — E1142 Type 2 diabetes mellitus with diabetic polyneuropathy: Secondary | ICD-10-CM | POA: Diagnosis not present

## 2019-04-07 DIAGNOSIS — K5900 Constipation, unspecified: Secondary | ICD-10-CM | POA: Diagnosis not present

## 2019-04-07 DIAGNOSIS — I6529 Occlusion and stenosis of unspecified carotid artery: Secondary | ICD-10-CM | POA: Diagnosis not present

## 2019-04-07 DIAGNOSIS — R21 Rash and other nonspecific skin eruption: Secondary | ICD-10-CM | POA: Diagnosis not present

## 2019-04-07 DIAGNOSIS — Z7982 Long term (current) use of aspirin: Secondary | ICD-10-CM | POA: Diagnosis not present

## 2019-04-07 DIAGNOSIS — I509 Heart failure, unspecified: Secondary | ICD-10-CM | POA: Diagnosis not present

## 2019-04-10 DIAGNOSIS — H353212 Exudative age-related macular degeneration, right eye, with inactive choroidal neovascularization: Secondary | ICD-10-CM | POA: Diagnosis not present

## 2019-04-10 DIAGNOSIS — H353221 Exudative age-related macular degeneration, left eye, with active choroidal neovascularization: Secondary | ICD-10-CM | POA: Diagnosis not present

## 2019-04-10 LAB — HM DIABETES EYE EXAM

## 2019-04-11 ENCOUNTER — Telehealth: Payer: Self-pay

## 2019-04-11 DIAGNOSIS — J441 Chronic obstructive pulmonary disease with (acute) exacerbation: Secondary | ICD-10-CM | POA: Diagnosis not present

## 2019-04-11 DIAGNOSIS — Z9981 Dependence on supplemental oxygen: Secondary | ICD-10-CM | POA: Diagnosis not present

## 2019-04-11 DIAGNOSIS — I2510 Atherosclerotic heart disease of native coronary artery without angina pectoris: Secondary | ICD-10-CM | POA: Diagnosis not present

## 2019-04-11 DIAGNOSIS — M6281 Muscle weakness (generalized): Secondary | ICD-10-CM | POA: Diagnosis not present

## 2019-04-11 DIAGNOSIS — I6529 Occlusion and stenosis of unspecified carotid artery: Secondary | ICD-10-CM | POA: Diagnosis not present

## 2019-04-11 DIAGNOSIS — Z7982 Long term (current) use of aspirin: Secondary | ICD-10-CM | POA: Diagnosis not present

## 2019-04-11 DIAGNOSIS — I509 Heart failure, unspecified: Secondary | ICD-10-CM | POA: Diagnosis not present

## 2019-04-11 DIAGNOSIS — Z7902 Long term (current) use of antithrombotics/antiplatelets: Secondary | ICD-10-CM | POA: Diagnosis not present

## 2019-04-11 DIAGNOSIS — Z9181 History of falling: Secondary | ICD-10-CM | POA: Diagnosis not present

## 2019-04-11 DIAGNOSIS — Z8673 Personal history of transient ischemic attack (TIA), and cerebral infarction without residual deficits: Secondary | ICD-10-CM | POA: Diagnosis not present

## 2019-04-11 DIAGNOSIS — C3411 Malignant neoplasm of upper lobe, right bronchus or lung: Secondary | ICD-10-CM | POA: Diagnosis not present

## 2019-04-11 DIAGNOSIS — M4722 Other spondylosis with radiculopathy, cervical region: Secondary | ICD-10-CM | POA: Diagnosis not present

## 2019-04-11 DIAGNOSIS — Z87891 Personal history of nicotine dependence: Secondary | ICD-10-CM | POA: Diagnosis not present

## 2019-04-11 DIAGNOSIS — E785 Hyperlipidemia, unspecified: Secondary | ICD-10-CM | POA: Diagnosis not present

## 2019-04-11 DIAGNOSIS — E1151 Type 2 diabetes mellitus with diabetic peripheral angiopathy without gangrene: Secondary | ICD-10-CM | POA: Diagnosis not present

## 2019-04-11 DIAGNOSIS — M109 Gout, unspecified: Secondary | ICD-10-CM | POA: Diagnosis not present

## 2019-04-11 DIAGNOSIS — E538 Deficiency of other specified B group vitamins: Secondary | ICD-10-CM | POA: Diagnosis not present

## 2019-04-11 DIAGNOSIS — R21 Rash and other nonspecific skin eruption: Secondary | ICD-10-CM | POA: Diagnosis not present

## 2019-04-11 DIAGNOSIS — E1142 Type 2 diabetes mellitus with diabetic polyneuropathy: Secondary | ICD-10-CM | POA: Diagnosis not present

## 2019-04-11 DIAGNOSIS — I110 Hypertensive heart disease with heart failure: Secondary | ICD-10-CM | POA: Diagnosis not present

## 2019-04-11 DIAGNOSIS — I5033 Acute on chronic diastolic (congestive) heart failure: Secondary | ICD-10-CM | POA: Diagnosis not present

## 2019-04-11 DIAGNOSIS — Z7984 Long term (current) use of oral hypoglycemic drugs: Secondary | ICD-10-CM | POA: Diagnosis not present

## 2019-04-11 DIAGNOSIS — K5900 Constipation, unspecified: Secondary | ICD-10-CM | POA: Diagnosis not present

## 2019-04-11 DIAGNOSIS — E43 Unspecified severe protein-calorie malnutrition: Secondary | ICD-10-CM | POA: Diagnosis not present

## 2019-04-11 NOTE — Telephone Encounter (Signed)
Copied from [place] [id number]. Topic: General - Inquiry >> Apr 11, 2019  2:04 PM Virl Axe D wrote: Reason for CRM: Megan with [facility] stated the pt had a fall on yesterday going to the bathroom by herself. She did not ask for assistance. Her husband helped her off the floor and she only has a bruise on her elbow.

## 2019-04-11 NOTE — Telephone Encounter (Signed)
Please review

## 2019-04-14 DIAGNOSIS — E1151 Type 2 diabetes mellitus with diabetic peripheral angiopathy without gangrene: Secondary | ICD-10-CM | POA: Diagnosis not present

## 2019-04-14 DIAGNOSIS — M109 Gout, unspecified: Secondary | ICD-10-CM | POA: Diagnosis not present

## 2019-04-14 DIAGNOSIS — Z8673 Personal history of transient ischemic attack (TIA), and cerebral infarction without residual deficits: Secondary | ICD-10-CM | POA: Diagnosis not present

## 2019-04-14 DIAGNOSIS — I110 Hypertensive heart disease with heart failure: Secondary | ICD-10-CM | POA: Diagnosis not present

## 2019-04-14 DIAGNOSIS — Z87891 Personal history of nicotine dependence: Secondary | ICD-10-CM | POA: Diagnosis not present

## 2019-04-14 DIAGNOSIS — C3411 Malignant neoplasm of upper lobe, right bronchus or lung: Secondary | ICD-10-CM | POA: Diagnosis not present

## 2019-04-14 DIAGNOSIS — I2510 Atherosclerotic heart disease of native coronary artery without angina pectoris: Secondary | ICD-10-CM | POA: Diagnosis not present

## 2019-04-14 DIAGNOSIS — Z7902 Long term (current) use of antithrombotics/antiplatelets: Secondary | ICD-10-CM | POA: Diagnosis not present

## 2019-04-14 DIAGNOSIS — R21 Rash and other nonspecific skin eruption: Secondary | ICD-10-CM | POA: Diagnosis not present

## 2019-04-14 DIAGNOSIS — Z9181 History of falling: Secondary | ICD-10-CM | POA: Diagnosis not present

## 2019-04-14 DIAGNOSIS — E1142 Type 2 diabetes mellitus with diabetic polyneuropathy: Secondary | ICD-10-CM | POA: Diagnosis not present

## 2019-04-14 DIAGNOSIS — J441 Chronic obstructive pulmonary disease with (acute) exacerbation: Secondary | ICD-10-CM | POA: Diagnosis not present

## 2019-04-14 DIAGNOSIS — E43 Unspecified severe protein-calorie malnutrition: Secondary | ICD-10-CM | POA: Diagnosis not present

## 2019-04-14 DIAGNOSIS — Z7982 Long term (current) use of aspirin: Secondary | ICD-10-CM | POA: Diagnosis not present

## 2019-04-14 DIAGNOSIS — I6529 Occlusion and stenosis of unspecified carotid artery: Secondary | ICD-10-CM | POA: Diagnosis not present

## 2019-04-14 DIAGNOSIS — I5033 Acute on chronic diastolic (congestive) heart failure: Secondary | ICD-10-CM | POA: Diagnosis not present

## 2019-04-14 DIAGNOSIS — M6281 Muscle weakness (generalized): Secondary | ICD-10-CM | POA: Diagnosis not present

## 2019-04-14 DIAGNOSIS — M4722 Other spondylosis with radiculopathy, cervical region: Secondary | ICD-10-CM | POA: Diagnosis not present

## 2019-04-14 DIAGNOSIS — E538 Deficiency of other specified B group vitamins: Secondary | ICD-10-CM | POA: Diagnosis not present

## 2019-04-14 DIAGNOSIS — E785 Hyperlipidemia, unspecified: Secondary | ICD-10-CM | POA: Diagnosis not present

## 2019-04-14 DIAGNOSIS — K5900 Constipation, unspecified: Secondary | ICD-10-CM | POA: Diagnosis not present

## 2019-04-14 DIAGNOSIS — I509 Heart failure, unspecified: Secondary | ICD-10-CM | POA: Diagnosis not present

## 2019-04-14 DIAGNOSIS — Z9981 Dependence on supplemental oxygen: Secondary | ICD-10-CM | POA: Diagnosis not present

## 2019-04-14 DIAGNOSIS — Z7984 Long term (current) use of oral hypoglycemic drugs: Secondary | ICD-10-CM | POA: Diagnosis not present

## 2019-04-17 ENCOUNTER — Encounter: Payer: Self-pay | Admitting: *Deleted

## 2019-04-18 DIAGNOSIS — M4722 Other spondylosis with radiculopathy, cervical region: Secondary | ICD-10-CM | POA: Diagnosis not present

## 2019-04-18 DIAGNOSIS — M6281 Muscle weakness (generalized): Secondary | ICD-10-CM | POA: Diagnosis not present

## 2019-04-18 DIAGNOSIS — Z7984 Long term (current) use of oral hypoglycemic drugs: Secondary | ICD-10-CM | POA: Diagnosis not present

## 2019-04-18 DIAGNOSIS — R21 Rash and other nonspecific skin eruption: Secondary | ICD-10-CM | POA: Diagnosis not present

## 2019-04-18 DIAGNOSIS — I6529 Occlusion and stenosis of unspecified carotid artery: Secondary | ICD-10-CM | POA: Diagnosis not present

## 2019-04-18 DIAGNOSIS — Z8673 Personal history of transient ischemic attack (TIA), and cerebral infarction without residual deficits: Secondary | ICD-10-CM | POA: Diagnosis not present

## 2019-04-18 DIAGNOSIS — Z9981 Dependence on supplemental oxygen: Secondary | ICD-10-CM | POA: Diagnosis not present

## 2019-04-18 DIAGNOSIS — E43 Unspecified severe protein-calorie malnutrition: Secondary | ICD-10-CM | POA: Diagnosis not present

## 2019-04-18 DIAGNOSIS — K5900 Constipation, unspecified: Secondary | ICD-10-CM | POA: Diagnosis not present

## 2019-04-18 DIAGNOSIS — Z7902 Long term (current) use of antithrombotics/antiplatelets: Secondary | ICD-10-CM | POA: Diagnosis not present

## 2019-04-18 DIAGNOSIS — Z87891 Personal history of nicotine dependence: Secondary | ICD-10-CM | POA: Diagnosis not present

## 2019-04-18 DIAGNOSIS — Z7982 Long term (current) use of aspirin: Secondary | ICD-10-CM | POA: Diagnosis not present

## 2019-04-18 DIAGNOSIS — I5033 Acute on chronic diastolic (congestive) heart failure: Secondary | ICD-10-CM | POA: Diagnosis not present

## 2019-04-18 DIAGNOSIS — J441 Chronic obstructive pulmonary disease with (acute) exacerbation: Secondary | ICD-10-CM | POA: Diagnosis not present

## 2019-04-18 DIAGNOSIS — E785 Hyperlipidemia, unspecified: Secondary | ICD-10-CM | POA: Diagnosis not present

## 2019-04-18 DIAGNOSIS — M109 Gout, unspecified: Secondary | ICD-10-CM | POA: Diagnosis not present

## 2019-04-18 DIAGNOSIS — E1151 Type 2 diabetes mellitus with diabetic peripheral angiopathy without gangrene: Secondary | ICD-10-CM | POA: Diagnosis not present

## 2019-04-18 DIAGNOSIS — E538 Deficiency of other specified B group vitamins: Secondary | ICD-10-CM | POA: Diagnosis not present

## 2019-04-18 DIAGNOSIS — I509 Heart failure, unspecified: Secondary | ICD-10-CM | POA: Diagnosis not present

## 2019-04-18 DIAGNOSIS — C3411 Malignant neoplasm of upper lobe, right bronchus or lung: Secondary | ICD-10-CM | POA: Diagnosis not present

## 2019-04-18 DIAGNOSIS — I2510 Atherosclerotic heart disease of native coronary artery without angina pectoris: Secondary | ICD-10-CM | POA: Diagnosis not present

## 2019-04-18 DIAGNOSIS — I110 Hypertensive heart disease with heart failure: Secondary | ICD-10-CM | POA: Diagnosis not present

## 2019-04-18 DIAGNOSIS — E1142 Type 2 diabetes mellitus with diabetic polyneuropathy: Secondary | ICD-10-CM | POA: Diagnosis not present

## 2019-04-18 DIAGNOSIS — Z9181 History of falling: Secondary | ICD-10-CM | POA: Diagnosis not present

## 2019-04-19 ENCOUNTER — Telehealth: Payer: Self-pay | Admitting: Family Medicine

## 2019-04-19 NOTE — Telephone Encounter (Signed)
[name] [name], from [facility], called stating that the pt is experiencing dizziness and increased confusion. She states that the pt has not been able to do her finger sticks. She states she is needing advice to know wether she should give th pt a urine sample or if she should go out and do the finger sticks herself. Please advise.     Callback # [phone] [id number]

## 2019-04-19 NOTE — Telephone Encounter (Signed)
FYI for chart.  Please do not close until we hear back from [place] about the patient.   I spoke with the patient's husband and Doran Clay the [facility] nurse.  The husband doesn't think the wife has had that much of a change.  He expressed concern that they are unable to get her sugar because "the machine doesn't work like the old one did".  He did admit that she is declining.   They were finally able to get the glucose checked yesterday with the help of the PT assistant and her husband said it was 240. Doran Clay is going to go out there tomorrow to do a urine check, educate on the fingerstick, see if they need a new machine or just education and she will report her assessment after.

## 2019-04-20 DIAGNOSIS — R21 Rash and other nonspecific skin eruption: Secondary | ICD-10-CM | POA: Diagnosis not present

## 2019-04-20 DIAGNOSIS — E785 Hyperlipidemia, unspecified: Secondary | ICD-10-CM | POA: Diagnosis not present

## 2019-04-20 DIAGNOSIS — I6529 Occlusion and stenosis of unspecified carotid artery: Secondary | ICD-10-CM | POA: Diagnosis not present

## 2019-04-20 DIAGNOSIS — I110 Hypertensive heart disease with heart failure: Secondary | ICD-10-CM | POA: Diagnosis not present

## 2019-04-20 DIAGNOSIS — M109 Gout, unspecified: Secondary | ICD-10-CM | POA: Diagnosis not present

## 2019-04-20 DIAGNOSIS — E1142 Type 2 diabetes mellitus with diabetic polyneuropathy: Secondary | ICD-10-CM | POA: Diagnosis not present

## 2019-04-20 DIAGNOSIS — M4722 Other spondylosis with radiculopathy, cervical region: Secondary | ICD-10-CM | POA: Diagnosis not present

## 2019-04-20 DIAGNOSIS — M6281 Muscle weakness (generalized): Secondary | ICD-10-CM | POA: Diagnosis not present

## 2019-04-20 DIAGNOSIS — Z8673 Personal history of transient ischemic attack (TIA), and cerebral infarction without residual deficits: Secondary | ICD-10-CM | POA: Diagnosis not present

## 2019-04-20 DIAGNOSIS — Z7982 Long term (current) use of aspirin: Secondary | ICD-10-CM | POA: Diagnosis not present

## 2019-04-20 DIAGNOSIS — E538 Deficiency of other specified B group vitamins: Secondary | ICD-10-CM | POA: Diagnosis not present

## 2019-04-20 DIAGNOSIS — I2510 Atherosclerotic heart disease of native coronary artery without angina pectoris: Secondary | ICD-10-CM | POA: Diagnosis not present

## 2019-04-20 DIAGNOSIS — Z87891 Personal history of nicotine dependence: Secondary | ICD-10-CM | POA: Diagnosis not present

## 2019-04-20 DIAGNOSIS — Z9981 Dependence on supplemental oxygen: Secondary | ICD-10-CM | POA: Diagnosis not present

## 2019-04-20 DIAGNOSIS — K5900 Constipation, unspecified: Secondary | ICD-10-CM | POA: Diagnosis not present

## 2019-04-20 DIAGNOSIS — Z9181 History of falling: Secondary | ICD-10-CM | POA: Diagnosis not present

## 2019-04-20 DIAGNOSIS — J441 Chronic obstructive pulmonary disease with (acute) exacerbation: Secondary | ICD-10-CM | POA: Diagnosis not present

## 2019-04-20 DIAGNOSIS — Z7689 Persons encountering health services in other specified circumstances: Secondary | ICD-10-CM | POA: Diagnosis not present

## 2019-04-20 DIAGNOSIS — Z7902 Long term (current) use of antithrombotics/antiplatelets: Secondary | ICD-10-CM | POA: Diagnosis not present

## 2019-04-20 DIAGNOSIS — Z7984 Long term (current) use of oral hypoglycemic drugs: Secondary | ICD-10-CM | POA: Diagnosis not present

## 2019-04-20 DIAGNOSIS — I5033 Acute on chronic diastolic (congestive) heart failure: Secondary | ICD-10-CM | POA: Diagnosis not present

## 2019-04-20 DIAGNOSIS — C3411 Malignant neoplasm of upper lobe, right bronchus or lung: Secondary | ICD-10-CM | POA: Diagnosis not present

## 2019-04-20 DIAGNOSIS — I509 Heart failure, unspecified: Secondary | ICD-10-CM | POA: Diagnosis not present

## 2019-04-20 DIAGNOSIS — E43 Unspecified severe protein-calorie malnutrition: Secondary | ICD-10-CM | POA: Diagnosis not present

## 2019-04-20 DIAGNOSIS — E1151 Type 2 diabetes mellitus with diabetic peripheral angiopathy without gangrene: Secondary | ICD-10-CM | POA: Diagnosis not present

## 2019-04-21 ENCOUNTER — Telehealth: Payer: Self-pay | Admitting: Family Medicine

## 2019-04-21 DIAGNOSIS — I5033 Acute on chronic diastolic (congestive) heart failure: Secondary | ICD-10-CM | POA: Diagnosis not present

## 2019-04-21 DIAGNOSIS — M6281 Muscle weakness (generalized): Secondary | ICD-10-CM | POA: Diagnosis not present

## 2019-04-21 DIAGNOSIS — K5900 Constipation, unspecified: Secondary | ICD-10-CM | POA: Diagnosis not present

## 2019-04-21 DIAGNOSIS — I6529 Occlusion and stenosis of unspecified carotid artery: Secondary | ICD-10-CM | POA: Diagnosis not present

## 2019-04-21 DIAGNOSIS — Z7984 Long term (current) use of oral hypoglycemic drugs: Secondary | ICD-10-CM | POA: Diagnosis not present

## 2019-04-21 DIAGNOSIS — Z8673 Personal history of transient ischemic attack (TIA), and cerebral infarction without residual deficits: Secondary | ICD-10-CM | POA: Diagnosis not present

## 2019-04-21 DIAGNOSIS — Z9181 History of falling: Secondary | ICD-10-CM | POA: Diagnosis not present

## 2019-04-21 DIAGNOSIS — R21 Rash and other nonspecific skin eruption: Secondary | ICD-10-CM | POA: Diagnosis not present

## 2019-04-21 DIAGNOSIS — Z87891 Personal history of nicotine dependence: Secondary | ICD-10-CM | POA: Diagnosis not present

## 2019-04-21 DIAGNOSIS — I110 Hypertensive heart disease with heart failure: Secondary | ICD-10-CM | POA: Diagnosis not present

## 2019-04-21 DIAGNOSIS — E1142 Type 2 diabetes mellitus with diabetic polyneuropathy: Secondary | ICD-10-CM | POA: Diagnosis not present

## 2019-04-21 DIAGNOSIS — Z7982 Long term (current) use of aspirin: Secondary | ICD-10-CM | POA: Diagnosis not present

## 2019-04-21 DIAGNOSIS — M109 Gout, unspecified: Secondary | ICD-10-CM | POA: Diagnosis not present

## 2019-04-21 DIAGNOSIS — Z9981 Dependence on supplemental oxygen: Secondary | ICD-10-CM | POA: Diagnosis not present

## 2019-04-21 DIAGNOSIS — J441 Chronic obstructive pulmonary disease with (acute) exacerbation: Secondary | ICD-10-CM | POA: Diagnosis not present

## 2019-04-21 DIAGNOSIS — E785 Hyperlipidemia, unspecified: Secondary | ICD-10-CM | POA: Diagnosis not present

## 2019-04-21 DIAGNOSIS — I509 Heart failure, unspecified: Secondary | ICD-10-CM | POA: Diagnosis not present

## 2019-04-21 DIAGNOSIS — E538 Deficiency of other specified B group vitamins: Secondary | ICD-10-CM | POA: Diagnosis not present

## 2019-04-21 DIAGNOSIS — M4722 Other spondylosis with radiculopathy, cervical region: Secondary | ICD-10-CM | POA: Diagnosis not present

## 2019-04-21 DIAGNOSIS — I2510 Atherosclerotic heart disease of native coronary artery without angina pectoris: Secondary | ICD-10-CM | POA: Diagnosis not present

## 2019-04-21 DIAGNOSIS — E1151 Type 2 diabetes mellitus with diabetic peripheral angiopathy without gangrene: Secondary | ICD-10-CM | POA: Diagnosis not present

## 2019-04-21 DIAGNOSIS — C3411 Malignant neoplasm of upper lobe, right bronchus or lung: Secondary | ICD-10-CM | POA: Diagnosis not present

## 2019-04-21 DIAGNOSIS — Z7902 Long term (current) use of antithrombotics/antiplatelets: Secondary | ICD-10-CM | POA: Diagnosis not present

## 2019-04-21 DIAGNOSIS — E43 Unspecified severe protein-calorie malnutrition: Secondary | ICD-10-CM | POA: Diagnosis not present

## 2019-04-21 LAB — URINALYSIS
Bilirubin: NEGATIVE
Glucose: NEGATIVE
Ketones: NEGATIVE
Nitrites, Initial: NEGATIVE
Occult Blood: NEGATIVE
Protein: NEGATIVE
Specific Gravity: 1.016
Urobilinogen, Ur: 0.2
WBC: NEGATIVE
pH: 7.5

## 2019-04-21 NOTE — Telephone Encounter (Signed)
Hinton Dyer from [facility] called to Advised Dr. Rosanna Randy that the Pt has missed a couple of Home health aid visits this week and they will be putting her visits on hold/ please advise

## 2019-04-25 DIAGNOSIS — E785 Hyperlipidemia, unspecified: Secondary | ICD-10-CM | POA: Diagnosis not present

## 2019-04-25 DIAGNOSIS — M109 Gout, unspecified: Secondary | ICD-10-CM | POA: Diagnosis not present

## 2019-04-25 DIAGNOSIS — E1151 Type 2 diabetes mellitus with diabetic peripheral angiopathy without gangrene: Secondary | ICD-10-CM | POA: Diagnosis not present

## 2019-04-25 DIAGNOSIS — Z87891 Personal history of nicotine dependence: Secondary | ICD-10-CM | POA: Diagnosis not present

## 2019-04-25 DIAGNOSIS — K5900 Constipation, unspecified: Secondary | ICD-10-CM | POA: Diagnosis not present

## 2019-04-25 DIAGNOSIS — I5033 Acute on chronic diastolic (congestive) heart failure: Secondary | ICD-10-CM | POA: Diagnosis not present

## 2019-04-25 DIAGNOSIS — I110 Hypertensive heart disease with heart failure: Secondary | ICD-10-CM | POA: Diagnosis not present

## 2019-04-25 DIAGNOSIS — M4722 Other spondylosis with radiculopathy, cervical region: Secondary | ICD-10-CM | POA: Diagnosis not present

## 2019-04-25 DIAGNOSIS — Z7982 Long term (current) use of aspirin: Secondary | ICD-10-CM | POA: Diagnosis not present

## 2019-04-25 DIAGNOSIS — Z9181 History of falling: Secondary | ICD-10-CM | POA: Diagnosis not present

## 2019-04-25 DIAGNOSIS — Z8673 Personal history of transient ischemic attack (TIA), and cerebral infarction without residual deficits: Secondary | ICD-10-CM | POA: Diagnosis not present

## 2019-04-25 DIAGNOSIS — Z9981 Dependence on supplemental oxygen: Secondary | ICD-10-CM | POA: Diagnosis not present

## 2019-04-25 DIAGNOSIS — Z7984 Long term (current) use of oral hypoglycemic drugs: Secondary | ICD-10-CM | POA: Diagnosis not present

## 2019-04-25 DIAGNOSIS — C3411 Malignant neoplasm of upper lobe, right bronchus or lung: Secondary | ICD-10-CM | POA: Diagnosis not present

## 2019-04-25 DIAGNOSIS — I509 Heart failure, unspecified: Secondary | ICD-10-CM | POA: Diagnosis not present

## 2019-04-25 DIAGNOSIS — E43 Unspecified severe protein-calorie malnutrition: Secondary | ICD-10-CM | POA: Diagnosis not present

## 2019-04-25 DIAGNOSIS — I2510 Atherosclerotic heart disease of native coronary artery without angina pectoris: Secondary | ICD-10-CM | POA: Diagnosis not present

## 2019-04-25 DIAGNOSIS — M6281 Muscle weakness (generalized): Secondary | ICD-10-CM | POA: Diagnosis not present

## 2019-04-25 DIAGNOSIS — I6529 Occlusion and stenosis of unspecified carotid artery: Secondary | ICD-10-CM | POA: Diagnosis not present

## 2019-04-25 DIAGNOSIS — R21 Rash and other nonspecific skin eruption: Secondary | ICD-10-CM | POA: Diagnosis not present

## 2019-04-25 DIAGNOSIS — E1142 Type 2 diabetes mellitus with diabetic polyneuropathy: Secondary | ICD-10-CM | POA: Diagnosis not present

## 2019-04-25 DIAGNOSIS — J441 Chronic obstructive pulmonary disease with (acute) exacerbation: Secondary | ICD-10-CM | POA: Diagnosis not present

## 2019-04-25 DIAGNOSIS — E538 Deficiency of other specified B group vitamins: Secondary | ICD-10-CM | POA: Diagnosis not present

## 2019-04-25 DIAGNOSIS — Z7902 Long term (current) use of antithrombotics/antiplatelets: Secondary | ICD-10-CM | POA: Diagnosis not present

## 2019-04-26 ENCOUNTER — Encounter: Payer: Self-pay | Admitting: *Deleted

## 2019-04-26 DIAGNOSIS — R0602 Shortness of breath: Secondary | ICD-10-CM | POA: Diagnosis not present

## 2019-04-26 DIAGNOSIS — J449 Chronic obstructive pulmonary disease, unspecified: Secondary | ICD-10-CM | POA: Diagnosis not present

## 2019-04-28 ENCOUNTER — Telehealth: Payer: Self-pay | Admitting: Family Medicine

## 2019-04-28 DIAGNOSIS — M4722 Other spondylosis with radiculopathy, cervical region: Secondary | ICD-10-CM | POA: Diagnosis not present

## 2019-04-28 DIAGNOSIS — I2510 Atherosclerotic heart disease of native coronary artery without angina pectoris: Secondary | ICD-10-CM | POA: Diagnosis not present

## 2019-04-28 DIAGNOSIS — I5033 Acute on chronic diastolic (congestive) heart failure: Secondary | ICD-10-CM | POA: Diagnosis not present

## 2019-04-28 DIAGNOSIS — M109 Gout, unspecified: Secondary | ICD-10-CM | POA: Diagnosis not present

## 2019-04-28 DIAGNOSIS — J449 Chronic obstructive pulmonary disease, unspecified: Secondary | ICD-10-CM | POA: Diagnosis not present

## 2019-04-28 DIAGNOSIS — E785 Hyperlipidemia, unspecified: Secondary | ICD-10-CM | POA: Diagnosis not present

## 2019-04-28 DIAGNOSIS — M6281 Muscle weakness (generalized): Secondary | ICD-10-CM | POA: Diagnosis not present

## 2019-04-28 DIAGNOSIS — Z87891 Personal history of nicotine dependence: Secondary | ICD-10-CM | POA: Diagnosis not present

## 2019-04-28 DIAGNOSIS — Z7982 Long term (current) use of aspirin: Secondary | ICD-10-CM | POA: Diagnosis not present

## 2019-04-28 DIAGNOSIS — R21 Rash and other nonspecific skin eruption: Secondary | ICD-10-CM | POA: Diagnosis not present

## 2019-04-28 DIAGNOSIS — Z9181 History of falling: Secondary | ICD-10-CM | POA: Diagnosis not present

## 2019-04-28 DIAGNOSIS — E1142 Type 2 diabetes mellitus with diabetic polyneuropathy: Secondary | ICD-10-CM | POA: Diagnosis not present

## 2019-04-28 DIAGNOSIS — Z7984 Long term (current) use of oral hypoglycemic drugs: Secondary | ICD-10-CM | POA: Diagnosis not present

## 2019-04-28 DIAGNOSIS — C3411 Malignant neoplasm of upper lobe, right bronchus or lung: Secondary | ICD-10-CM | POA: Diagnosis not present

## 2019-04-28 DIAGNOSIS — Z8673 Personal history of transient ischemic attack (TIA), and cerebral infarction without residual deficits: Secondary | ICD-10-CM | POA: Diagnosis not present

## 2019-04-28 DIAGNOSIS — E538 Deficiency of other specified B group vitamins: Secondary | ICD-10-CM | POA: Diagnosis not present

## 2019-04-28 DIAGNOSIS — E1151 Type 2 diabetes mellitus with diabetic peripheral angiopathy without gangrene: Secondary | ICD-10-CM | POA: Diagnosis not present

## 2019-04-28 DIAGNOSIS — I110 Hypertensive heart disease with heart failure: Secondary | ICD-10-CM | POA: Diagnosis not present

## 2019-04-28 DIAGNOSIS — Z7902 Long term (current) use of antithrombotics/antiplatelets: Secondary | ICD-10-CM | POA: Diagnosis not present

## 2019-04-28 DIAGNOSIS — K5900 Constipation, unspecified: Secondary | ICD-10-CM | POA: Diagnosis not present

## 2019-04-28 DIAGNOSIS — I6529 Occlusion and stenosis of unspecified carotid artery: Secondary | ICD-10-CM | POA: Diagnosis not present

## 2019-04-28 DIAGNOSIS — Z9981 Dependence on supplemental oxygen: Secondary | ICD-10-CM | POA: Diagnosis not present

## 2019-04-28 DIAGNOSIS — I509 Heart failure, unspecified: Secondary | ICD-10-CM | POA: Diagnosis not present

## 2019-04-28 DIAGNOSIS — E43 Unspecified severe protein-calorie malnutrition: Secondary | ICD-10-CM | POA: Diagnosis not present

## 2019-04-28 DIAGNOSIS — J441 Chronic obstructive pulmonary disease with (acute) exacerbation: Secondary | ICD-10-CM | POA: Diagnosis not present

## 2019-04-28 NOTE — Telephone Encounter (Signed)
[name] [name] with [facility] called to let Dr. Rosanna Randy know that patient fell last night but there was no injury also her oxygen level has been 85-86 and she would like to know if patient can be on supplemental oxygen during the day. She would like a call back from provider or his CMA. Call back number is [phone].

## 2019-04-28 NOTE — Telephone Encounter (Signed)
Returned call to [facility] with [facility]. Gave verbal okay for supplemental oxygen for daytime. Also, Sue Lush stated patient does have complaints of mild left shoulder pain after fall. Please advise?

## 2019-05-01 DIAGNOSIS — E538 Deficiency of other specified B group vitamins: Secondary | ICD-10-CM | POA: Diagnosis not present

## 2019-05-01 DIAGNOSIS — E785 Hyperlipidemia, unspecified: Secondary | ICD-10-CM | POA: Diagnosis not present

## 2019-05-01 DIAGNOSIS — J441 Chronic obstructive pulmonary disease with (acute) exacerbation: Secondary | ICD-10-CM | POA: Diagnosis not present

## 2019-05-01 DIAGNOSIS — Z7902 Long term (current) use of antithrombotics/antiplatelets: Secondary | ICD-10-CM | POA: Diagnosis not present

## 2019-05-01 DIAGNOSIS — I6529 Occlusion and stenosis of unspecified carotid artery: Secondary | ICD-10-CM | POA: Diagnosis not present

## 2019-05-01 DIAGNOSIS — K5900 Constipation, unspecified: Secondary | ICD-10-CM | POA: Diagnosis not present

## 2019-05-01 DIAGNOSIS — R21 Rash and other nonspecific skin eruption: Secondary | ICD-10-CM | POA: Diagnosis not present

## 2019-05-01 DIAGNOSIS — I2510 Atherosclerotic heart disease of native coronary artery without angina pectoris: Secondary | ICD-10-CM | POA: Diagnosis not present

## 2019-05-01 DIAGNOSIS — E1151 Type 2 diabetes mellitus with diabetic peripheral angiopathy without gangrene: Secondary | ICD-10-CM | POA: Diagnosis not present

## 2019-05-01 DIAGNOSIS — Z8673 Personal history of transient ischemic attack (TIA), and cerebral infarction without residual deficits: Secondary | ICD-10-CM | POA: Diagnosis not present

## 2019-05-01 DIAGNOSIS — M4722 Other spondylosis with radiculopathy, cervical region: Secondary | ICD-10-CM | POA: Diagnosis not present

## 2019-05-01 DIAGNOSIS — M109 Gout, unspecified: Secondary | ICD-10-CM | POA: Diagnosis not present

## 2019-05-01 DIAGNOSIS — Z7984 Long term (current) use of oral hypoglycemic drugs: Secondary | ICD-10-CM | POA: Diagnosis not present

## 2019-05-01 DIAGNOSIS — I110 Hypertensive heart disease with heart failure: Secondary | ICD-10-CM | POA: Diagnosis not present

## 2019-05-01 DIAGNOSIS — E1142 Type 2 diabetes mellitus with diabetic polyneuropathy: Secondary | ICD-10-CM | POA: Diagnosis not present

## 2019-05-01 DIAGNOSIS — M6281 Muscle weakness (generalized): Secondary | ICD-10-CM | POA: Diagnosis not present

## 2019-05-01 DIAGNOSIS — I509 Heart failure, unspecified: Secondary | ICD-10-CM | POA: Diagnosis not present

## 2019-05-01 DIAGNOSIS — Z9181 History of falling: Secondary | ICD-10-CM | POA: Diagnosis not present

## 2019-05-01 DIAGNOSIS — I5033 Acute on chronic diastolic (congestive) heart failure: Secondary | ICD-10-CM | POA: Diagnosis not present

## 2019-05-01 DIAGNOSIS — E43 Unspecified severe protein-calorie malnutrition: Secondary | ICD-10-CM | POA: Diagnosis not present

## 2019-05-01 DIAGNOSIS — Z7982 Long term (current) use of aspirin: Secondary | ICD-10-CM | POA: Diagnosis not present

## 2019-05-01 DIAGNOSIS — Z9981 Dependence on supplemental oxygen: Secondary | ICD-10-CM | POA: Diagnosis not present

## 2019-05-01 DIAGNOSIS — C3411 Malignant neoplasm of upper lobe, right bronchus or lung: Secondary | ICD-10-CM | POA: Diagnosis not present

## 2019-05-01 DIAGNOSIS — Z87891 Personal history of nicotine dependence: Secondary | ICD-10-CM | POA: Diagnosis not present

## 2019-05-03 ENCOUNTER — Other Ambulatory Visit: Payer: Self-pay | Admitting: Family Medicine

## 2019-05-03 ENCOUNTER — Telehealth: Payer: Self-pay | Admitting: Family Medicine

## 2019-05-03 DIAGNOSIS — I110 Hypertensive heart disease with heart failure: Secondary | ICD-10-CM | POA: Diagnosis not present

## 2019-05-03 DIAGNOSIS — Z7984 Long term (current) use of oral hypoglycemic drugs: Secondary | ICD-10-CM | POA: Diagnosis not present

## 2019-05-03 DIAGNOSIS — Z87891 Personal history of nicotine dependence: Secondary | ICD-10-CM | POA: Diagnosis not present

## 2019-05-03 DIAGNOSIS — Z8673 Personal history of transient ischemic attack (TIA), and cerebral infarction without residual deficits: Secondary | ICD-10-CM | POA: Diagnosis not present

## 2019-05-03 DIAGNOSIS — M4722 Other spondylosis with radiculopathy, cervical region: Secondary | ICD-10-CM | POA: Diagnosis not present

## 2019-05-03 DIAGNOSIS — K5900 Constipation, unspecified: Secondary | ICD-10-CM | POA: Diagnosis not present

## 2019-05-03 DIAGNOSIS — M79604 Pain in right leg: Secondary | ICD-10-CM

## 2019-05-03 DIAGNOSIS — I509 Heart failure, unspecified: Secondary | ICD-10-CM | POA: Diagnosis not present

## 2019-05-03 DIAGNOSIS — Z7902 Long term (current) use of antithrombotics/antiplatelets: Secondary | ICD-10-CM | POA: Diagnosis not present

## 2019-05-03 DIAGNOSIS — Z9181 History of falling: Secondary | ICD-10-CM | POA: Diagnosis not present

## 2019-05-03 DIAGNOSIS — E1151 Type 2 diabetes mellitus with diabetic peripheral angiopathy without gangrene: Secondary | ICD-10-CM | POA: Diagnosis not present

## 2019-05-03 DIAGNOSIS — C3411 Malignant neoplasm of upper lobe, right bronchus or lung: Secondary | ICD-10-CM | POA: Diagnosis not present

## 2019-05-03 DIAGNOSIS — E538 Deficiency of other specified B group vitamins: Secondary | ICD-10-CM | POA: Diagnosis not present

## 2019-05-03 DIAGNOSIS — M109 Gout, unspecified: Secondary | ICD-10-CM | POA: Diagnosis not present

## 2019-05-03 DIAGNOSIS — I2510 Atherosclerotic heart disease of native coronary artery without angina pectoris: Secondary | ICD-10-CM | POA: Diagnosis not present

## 2019-05-03 DIAGNOSIS — M79605 Pain in left leg: Secondary | ICD-10-CM

## 2019-05-03 DIAGNOSIS — M6281 Muscle weakness (generalized): Secondary | ICD-10-CM | POA: Diagnosis not present

## 2019-05-03 DIAGNOSIS — R21 Rash and other nonspecific skin eruption: Secondary | ICD-10-CM | POA: Diagnosis not present

## 2019-05-03 DIAGNOSIS — E1342 Other specified diabetes mellitus with diabetic polyneuropathy: Secondary | ICD-10-CM

## 2019-05-03 DIAGNOSIS — I6529 Occlusion and stenosis of unspecified carotid artery: Secondary | ICD-10-CM | POA: Diagnosis not present

## 2019-05-03 DIAGNOSIS — I5033 Acute on chronic diastolic (congestive) heart failure: Secondary | ICD-10-CM | POA: Diagnosis not present

## 2019-05-03 DIAGNOSIS — Z7982 Long term (current) use of aspirin: Secondary | ICD-10-CM | POA: Diagnosis not present

## 2019-05-03 DIAGNOSIS — E1142 Type 2 diabetes mellitus with diabetic polyneuropathy: Secondary | ICD-10-CM | POA: Diagnosis not present

## 2019-05-03 DIAGNOSIS — E43 Unspecified severe protein-calorie malnutrition: Secondary | ICD-10-CM | POA: Diagnosis not present

## 2019-05-03 DIAGNOSIS — Z9981 Dependence on supplemental oxygen: Secondary | ICD-10-CM | POA: Diagnosis not present

## 2019-05-03 DIAGNOSIS — J441 Chronic obstructive pulmonary disease with (acute) exacerbation: Secondary | ICD-10-CM | POA: Diagnosis not present

## 2019-05-03 DIAGNOSIS — E785 Hyperlipidemia, unspecified: Secondary | ICD-10-CM | POA: Diagnosis not present

## 2019-05-03 NOTE — Telephone Encounter (Signed)
LMOVM for Nichole to cb. This message is not clear on whether this should be a verbal order or a written order? Did give verbal ok on vm.

## 2019-05-03 NOTE — Telephone Encounter (Signed)
nicole PTA with [facility] is call and would like an order for social worker to come out and to discuss community resources with patient

## 2019-05-05 DIAGNOSIS — I6529 Occlusion and stenosis of unspecified carotid artery: Secondary | ICD-10-CM | POA: Diagnosis not present

## 2019-05-05 DIAGNOSIS — Z9981 Dependence on supplemental oxygen: Secondary | ICD-10-CM | POA: Diagnosis not present

## 2019-05-05 DIAGNOSIS — M6281 Muscle weakness (generalized): Secondary | ICD-10-CM | POA: Diagnosis not present

## 2019-05-05 DIAGNOSIS — I5033 Acute on chronic diastolic (congestive) heart failure: Secondary | ICD-10-CM | POA: Diagnosis not present

## 2019-05-05 DIAGNOSIS — M109 Gout, unspecified: Secondary | ICD-10-CM | POA: Diagnosis not present

## 2019-05-05 DIAGNOSIS — K5900 Constipation, unspecified: Secondary | ICD-10-CM | POA: Diagnosis not present

## 2019-05-05 DIAGNOSIS — E1142 Type 2 diabetes mellitus with diabetic polyneuropathy: Secondary | ICD-10-CM | POA: Diagnosis not present

## 2019-05-05 DIAGNOSIS — E43 Unspecified severe protein-calorie malnutrition: Secondary | ICD-10-CM | POA: Diagnosis not present

## 2019-05-05 DIAGNOSIS — Z8673 Personal history of transient ischemic attack (TIA), and cerebral infarction without residual deficits: Secondary | ICD-10-CM | POA: Diagnosis not present

## 2019-05-05 DIAGNOSIS — J441 Chronic obstructive pulmonary disease with (acute) exacerbation: Secondary | ICD-10-CM | POA: Diagnosis not present

## 2019-05-05 DIAGNOSIS — C3411 Malignant neoplasm of upper lobe, right bronchus or lung: Secondary | ICD-10-CM | POA: Diagnosis not present

## 2019-05-05 DIAGNOSIS — R21 Rash and other nonspecific skin eruption: Secondary | ICD-10-CM | POA: Diagnosis not present

## 2019-05-05 DIAGNOSIS — I509 Heart failure, unspecified: Secondary | ICD-10-CM | POA: Diagnosis not present

## 2019-05-05 DIAGNOSIS — Z9181 History of falling: Secondary | ICD-10-CM | POA: Diagnosis not present

## 2019-05-05 DIAGNOSIS — Z7982 Long term (current) use of aspirin: Secondary | ICD-10-CM | POA: Diagnosis not present

## 2019-05-05 DIAGNOSIS — I110 Hypertensive heart disease with heart failure: Secondary | ICD-10-CM | POA: Diagnosis not present

## 2019-05-05 DIAGNOSIS — I2510 Atherosclerotic heart disease of native coronary artery without angina pectoris: Secondary | ICD-10-CM | POA: Diagnosis not present

## 2019-05-05 DIAGNOSIS — Z7984 Long term (current) use of oral hypoglycemic drugs: Secondary | ICD-10-CM | POA: Diagnosis not present

## 2019-05-05 DIAGNOSIS — E538 Deficiency of other specified B group vitamins: Secondary | ICD-10-CM | POA: Diagnosis not present

## 2019-05-05 DIAGNOSIS — E785 Hyperlipidemia, unspecified: Secondary | ICD-10-CM | POA: Diagnosis not present

## 2019-05-05 DIAGNOSIS — Z7902 Long term (current) use of antithrombotics/antiplatelets: Secondary | ICD-10-CM | POA: Diagnosis not present

## 2019-05-05 DIAGNOSIS — E1151 Type 2 diabetes mellitus with diabetic peripheral angiopathy without gangrene: Secondary | ICD-10-CM | POA: Diagnosis not present

## 2019-05-05 DIAGNOSIS — Z87891 Personal history of nicotine dependence: Secondary | ICD-10-CM | POA: Diagnosis not present

## 2019-05-05 DIAGNOSIS — M4722 Other spondylosis with radiculopathy, cervical region: Secondary | ICD-10-CM | POA: Diagnosis not present

## 2019-05-08 DIAGNOSIS — I110 Hypertensive heart disease with heart failure: Secondary | ICD-10-CM | POA: Diagnosis not present

## 2019-05-08 DIAGNOSIS — Z7902 Long term (current) use of antithrombotics/antiplatelets: Secondary | ICD-10-CM | POA: Diagnosis not present

## 2019-05-08 DIAGNOSIS — I509 Heart failure, unspecified: Secondary | ICD-10-CM | POA: Diagnosis not present

## 2019-05-08 DIAGNOSIS — I5033 Acute on chronic diastolic (congestive) heart failure: Secondary | ICD-10-CM | POA: Diagnosis not present

## 2019-05-08 DIAGNOSIS — R21 Rash and other nonspecific skin eruption: Secondary | ICD-10-CM | POA: Diagnosis not present

## 2019-05-08 DIAGNOSIS — K5900 Constipation, unspecified: Secondary | ICD-10-CM | POA: Diagnosis not present

## 2019-05-08 DIAGNOSIS — C3411 Malignant neoplasm of upper lobe, right bronchus or lung: Secondary | ICD-10-CM | POA: Diagnosis not present

## 2019-05-08 DIAGNOSIS — Z87891 Personal history of nicotine dependence: Secondary | ICD-10-CM | POA: Diagnosis not present

## 2019-05-08 DIAGNOSIS — Z7982 Long term (current) use of aspirin: Secondary | ICD-10-CM | POA: Diagnosis not present

## 2019-05-08 DIAGNOSIS — Z9181 History of falling: Secondary | ICD-10-CM | POA: Diagnosis not present

## 2019-05-08 DIAGNOSIS — Z9981 Dependence on supplemental oxygen: Secondary | ICD-10-CM | POA: Diagnosis not present

## 2019-05-08 DIAGNOSIS — Z8673 Personal history of transient ischemic attack (TIA), and cerebral infarction without residual deficits: Secondary | ICD-10-CM | POA: Diagnosis not present

## 2019-05-08 DIAGNOSIS — E43 Unspecified severe protein-calorie malnutrition: Secondary | ICD-10-CM | POA: Diagnosis not present

## 2019-05-08 DIAGNOSIS — M6281 Muscle weakness (generalized): Secondary | ICD-10-CM | POA: Diagnosis not present

## 2019-05-08 DIAGNOSIS — I2510 Atherosclerotic heart disease of native coronary artery without angina pectoris: Secondary | ICD-10-CM | POA: Diagnosis not present

## 2019-05-08 DIAGNOSIS — E1142 Type 2 diabetes mellitus with diabetic polyneuropathy: Secondary | ICD-10-CM | POA: Diagnosis not present

## 2019-05-08 DIAGNOSIS — E538 Deficiency of other specified B group vitamins: Secondary | ICD-10-CM | POA: Diagnosis not present

## 2019-05-08 DIAGNOSIS — E785 Hyperlipidemia, unspecified: Secondary | ICD-10-CM | POA: Diagnosis not present

## 2019-05-08 DIAGNOSIS — Z7984 Long term (current) use of oral hypoglycemic drugs: Secondary | ICD-10-CM | POA: Diagnosis not present

## 2019-05-08 DIAGNOSIS — E1151 Type 2 diabetes mellitus with diabetic peripheral angiopathy without gangrene: Secondary | ICD-10-CM | POA: Diagnosis not present

## 2019-05-08 DIAGNOSIS — I6529 Occlusion and stenosis of unspecified carotid artery: Secondary | ICD-10-CM | POA: Diagnosis not present

## 2019-05-08 DIAGNOSIS — M109 Gout, unspecified: Secondary | ICD-10-CM | POA: Diagnosis not present

## 2019-05-08 DIAGNOSIS — J441 Chronic obstructive pulmonary disease with (acute) exacerbation: Secondary | ICD-10-CM | POA: Diagnosis not present

## 2019-05-08 DIAGNOSIS — M4722 Other spondylosis with radiculopathy, cervical region: Secondary | ICD-10-CM | POA: Diagnosis not present

## 2019-05-11 ENCOUNTER — Encounter (INDEPENDENT_AMBULATORY_CARE_PROVIDER_SITE_OTHER): Payer: Medicare Other

## 2019-05-11 ENCOUNTER — Ambulatory Visit (INDEPENDENT_AMBULATORY_CARE_PROVIDER_SITE_OTHER): Payer: Self-pay | Admitting: Vascular Surgery

## 2019-05-12 NOTE — Progress Notes (Signed)
Patient: [name] [name] Female    DOB: 1940/11/20   79 y.o.   MRN: [id number] Visit Date: 05/15/2019  Today's Provider: [name], MD   Chief Complaint  Patient presents with  . Diabetes Mellitus   Subjective:    Virtual Visit via Telephone Note  I connected with [name] on 05/15/19 at  2:20 PM EST by telephone and verified that I am speaking with the correct person using two identifiers.  Location: Patient: home Provider: office   I discussed the limitations, risks, security and privacy concerns of performing an evaluation and management service by telephone and the availability of in person appointments. I also discussed with the patient that there may be a patient responsible charge related to this service. The patient expressed understanding and agreed to proceed.   HPI  I spoke with the patient and her husband.  She is still having problems walking.  Physical therapy ended last week.  She is doing her exercises they gave her sometimes.  She states that anytime she has chest pain nitroglycerin controls it.  She has not had blood work I think since her last visit.  She is not checking her blood sugar as she says she has trouble getting it done on the meter with the lancets. Patient has not been able to check her sugar from home in a week or more because she is unable to get any blood.   Diabetic polyneuropathy associated with type 2 diabetes mellitus ([place]) From 01/10/2019-HgB A1c 5.9. from 04/03/2019-Diabetic shoes are appropriate if patient is able to get back to ambulating.  Weakness of both legs From 04/03/2019-I think this is due to deconditioning and overall disease burden.  Physical therapy starts in 2 days.  On her next visit we need to talk about DNR/ MOST forms Walker or wheelchair will be appropriate.  We will see how she does with physical therapy.  Eczema of both external ears From 04/03/2019-given rx for triamcinolone ointment 0.5 %, Apply a  Pea size to the outer ear at bedtime.    CAD in native artery From 04/03/2019-All risk factors treated.  Essential (primary) hypertension From 04/03/2019-no changes.  Malignant neoplasm of upper lobe of right lung ([place]) From 04/03/2019.Followed by oncology.   Allergies  Allergen Reactions  . Coconut Fatty Acids Swelling and Other (See Comments)    Throat swells     Current Outpatient Medications:  .  albuterol (VENTOLIN HFA) 108 (90 Base) MCG/ACT inhaler, Inhale 2 puffs into the lungs every 4 (four) hours as needed for wheezing or shortness of breath., Disp: 8 g, Rfl: 11 .  amLODipine (NORVASC) 5 MG tablet, Take 5 mg by mouth daily. , Disp: , Rfl:  .  aspirin EC 81 MG EC tablet, Take 1 tablet (81 mg total) by mouth daily., Disp: 30 tablet, Rfl: 0 .  atorvastatin (LIPITOR) 40 MG tablet, Take 1 tablet (40 mg total) by mouth daily at 6 PM., Disp: 90 tablet, Rfl: 3 .  budesonide-formoterol (SYMBICORT) 80-4.5 MCG/ACT inhaler, Inhale 2 puffs into the lungs 2 (two) times daily., Disp: 1 Inhaler, Rfl: 2 .  clopidogrel (PLAVIX) 75 MG tablet, TAKE 1 TABLET BY MOUTH EVERY DAY (Patient taking differently: Take 75 mg by mouth daily. ), Disp: 90 tablet, Rfl: 1 .  fluticasone (FLONASE) 50 MCG/ACT nasal spray, Place 2 sprays into both nostrils daily., Disp: 16 g, Rfl: 6 .  gabapentin (NEURONTIN) 600 MG tablet, TAKE ONE-HALF TABLET BY  MOUTH TWO TIMES DAILY, Disp: 90 tablet, Rfl: 3 .  glimepiride (AMARYL) 2 MG tablet, TAKE 1 TABLET BY MOUTH TWO  TIMES DAILY, Disp: 180 tablet, Rfl: 3 .  glucose blood (ONETOUCH ULTRA) test strip, USE TO CHECK BLOOD SUGAR ONCE DAILY, Disp: 100 strip, Rfl: 3 .  isosorbide mononitrate (IMDUR) 30 MG 24 hr tablet, TAKE 1 TABLET BY MOUTH  DAILY, Disp: 90 tablet, Rfl: 1 .  LORazepam (ATIVAN) 1 MG tablet, Take 1 tablet (1 mg total) by mouth 2 (two) times daily as needed for anxiety., Disp: 30 tablet, Rfl: 5 .  magnesium oxide (MAG-OX) 400 (241.3 MG) MG tablet, Take 400 mg  by mouth 2 (two) times daily. , Disp: , Rfl:  .  metFORMIN (GLUCOPHAGE) 500 MG tablet, Take 1 tablet (500 mg total) by mouth 2 (two) times daily with a meal. (Patient taking differently: Take 500 mg by mouth 2 (two) times daily with a meal. Can only tolerate once daily with meal), Disp: 180 tablet, Rfl: 3 .  metoprolol succinate (TOPROL-XL) 100 MG 24 hr tablet, Take 1 tablet (100 mg total) by mouth daily., Disp: 90 tablet, Rfl: 3 .  mometasone (ELOCON) 0.1 % lotion, PLACE 4 DROPS INTO EACH EAR CANAL AT BEDTIME AS NEEDED FOR DRY SKIN/ITCHING/DISCOMFORT., Disp: , Rfl:  .  Multiple Vitamin (MULTIVITAMIN) tablet, Take 0.5 tablets by mouth 2 (two) times daily., Disp: , Rfl:  .  Multiple Vitamins-Minerals (ICAPS) CAPS, Take 1 capsule by mouth 2 (two) times daily. , Disp: , Rfl:  .  nitroGLYCERIN (NITROSTAT) 0.4 MG SL tablet, Place 1 tablet (0.4 mg total) under the tongue every 5 (five) minutes as needed for chest pain., Disp: 25 tablet, Rfl: 3 .  nystatin (MYCOSTATIN/NYSTOP) powder, Apply 1 application topically 3 (three) times daily., Disp: 15 g, Rfl: 0 .  pantoprazole (PROTONIX) 40 MG tablet, TAKE 1 TABLET BY MOUTH TWICE A DAY, Disp: 180 tablet, Rfl: 1 .  sucralfate (CARAFATE) 1 g tablet, Take 1 tablet (1 g total) by mouth 2 (two) times daily., Disp: 180 tablet, Rfl: 3 .  vitamin E (VITAMIN E) 400 UNIT capsule, Take 400 Units by mouth daily., Disp: , Rfl:  No current facility-administered medications for this visit.  Facility-Administered Medications Ordered in Other Visits:  .  ondansetron (ZOFRAN) 8 mg, dexamethasone (DECADRON) 10 mg in sodium chloride 0.9 % 50 mL IVPB, , Intravenous, Once, Finnegan, Kathlene November, MD  Review of Systems  Constitutional: Negative for appetite change, chills, fatigue and fever.  Respiratory: Negative for chest tightness and shortness of breath.   Cardiovascular: Negative for chest pain and palpitations.  Gastrointestinal: Negative for abdominal pain, nausea and vomiting.   Neurological: Negative for dizziness and weakness.    Social History   Tobacco Use  . Smoking status: Former Smoker    Packs/day: 1.00    Years: 50.00    Pack years: 50.00    Types: Cigarettes    Quit date: 10/03/2015    Years since quitting: 3.6  . Smokeless tobacco: Never Used  . Tobacco comment: smokes 3 cigs daily 05/06/15. Pt instructed to quit.  Substance Use Topics  . Alcohol use: No    Alcohol/week: 0.0 standard drinks      Objective:   There were no vitals taken for this visit. There were no vitals filed for this visit.There is no height or weight on file to calculate BMI.   Physical Exam   No results found for any visits on 05/15/19.  Assessment & Plan      1. HYPERTENSION, BENIGN Return to clinic 2 to 3 months  2. CAD in native artery All risk factors treated  3. Bilateral carotid artery stenosis   4. Diabetic polyneuropathy associated with type 2 diabetes mellitus ([place]) Obtain A1c with lab draw.  5. AKI (acute kidney injury) ([place]) Follow-up chronic kidney disease although I think this was mainly AKI previously.  6. Protein-calorie malnutrition, severe In general she is in failing health.  7. Iron deficiency anemia secondary to inadequate dietary iron intake  8.  Leg weakness May need physical therapy later this month or sometime next month again. 9.Lung cancer  I discussed the assessment and treatment plan with the patient. The patient was provided an opportunity to ask questions and all were answered. The patient agreed with the plan and demonstrated an understanding of the instructions.   The patient was advised to call back or seek an in-person evaluation if the symptoms worsen or if the condition fails to improve as anticipated.  I provided 10 minutes of non-face-to-face time during this encounter.     Richard Cranford Mon, MD  [facility]

## 2019-05-15 ENCOUNTER — Ambulatory Visit (INDEPENDENT_AMBULATORY_CARE_PROVIDER_SITE_OTHER): Payer: Medicare Other | Admitting: Family Medicine

## 2019-05-15 DIAGNOSIS — E1142 Type 2 diabetes mellitus with diabetic polyneuropathy: Secondary | ICD-10-CM

## 2019-05-15 DIAGNOSIS — I6523 Occlusion and stenosis of bilateral carotid arteries: Secondary | ICD-10-CM

## 2019-05-15 DIAGNOSIS — I10 Essential (primary) hypertension: Secondary | ICD-10-CM | POA: Diagnosis not present

## 2019-05-15 DIAGNOSIS — N179 Acute kidney failure, unspecified: Secondary | ICD-10-CM

## 2019-05-15 DIAGNOSIS — I2510 Atherosclerotic heart disease of native coronary artery without angina pectoris: Secondary | ICD-10-CM | POA: Diagnosis not present

## 2019-05-15 DIAGNOSIS — D508 Other iron deficiency anemias: Secondary | ICD-10-CM

## 2019-05-15 DIAGNOSIS — E43 Unspecified severe protein-calorie malnutrition: Secondary | ICD-10-CM

## 2019-05-15 NOTE — Addendum Note (Signed)
Addended by: Gerald Stabs on: 05/15/2019 04:22 PM   Modules accepted: Orders

## 2019-05-16 ENCOUNTER — Telehealth: Payer: Self-pay | Admitting: Family Medicine

## 2019-05-16 DIAGNOSIS — Z7984 Long term (current) use of oral hypoglycemic drugs: Secondary | ICD-10-CM | POA: Diagnosis not present

## 2019-05-16 DIAGNOSIS — I509 Heart failure, unspecified: Secondary | ICD-10-CM | POA: Diagnosis not present

## 2019-05-16 DIAGNOSIS — Z87891 Personal history of nicotine dependence: Secondary | ICD-10-CM | POA: Diagnosis not present

## 2019-05-16 DIAGNOSIS — Z7982 Long term (current) use of aspirin: Secondary | ICD-10-CM | POA: Diagnosis not present

## 2019-05-16 DIAGNOSIS — M109 Gout, unspecified: Secondary | ICD-10-CM | POA: Diagnosis not present

## 2019-05-16 DIAGNOSIS — E43 Unspecified severe protein-calorie malnutrition: Secondary | ICD-10-CM | POA: Diagnosis not present

## 2019-05-16 DIAGNOSIS — I6529 Occlusion and stenosis of unspecified carotid artery: Secondary | ICD-10-CM | POA: Diagnosis not present

## 2019-05-16 DIAGNOSIS — E1142 Type 2 diabetes mellitus with diabetic polyneuropathy: Secondary | ICD-10-CM | POA: Diagnosis not present

## 2019-05-16 DIAGNOSIS — E538 Deficiency of other specified B group vitamins: Secondary | ICD-10-CM | POA: Diagnosis not present

## 2019-05-16 DIAGNOSIS — M4722 Other spondylosis with radiculopathy, cervical region: Secondary | ICD-10-CM | POA: Diagnosis not present

## 2019-05-16 DIAGNOSIS — I110 Hypertensive heart disease with heart failure: Secondary | ICD-10-CM | POA: Diagnosis not present

## 2019-05-16 DIAGNOSIS — I2510 Atherosclerotic heart disease of native coronary artery without angina pectoris: Secondary | ICD-10-CM | POA: Diagnosis not present

## 2019-05-16 DIAGNOSIS — Z7902 Long term (current) use of antithrombotics/antiplatelets: Secondary | ICD-10-CM | POA: Diagnosis not present

## 2019-05-16 DIAGNOSIS — E785 Hyperlipidemia, unspecified: Secondary | ICD-10-CM | POA: Diagnosis not present

## 2019-05-16 DIAGNOSIS — Z9981 Dependence on supplemental oxygen: Secondary | ICD-10-CM | POA: Diagnosis not present

## 2019-05-16 DIAGNOSIS — Z9181 History of falling: Secondary | ICD-10-CM | POA: Diagnosis not present

## 2019-05-16 DIAGNOSIS — M6281 Muscle weakness (generalized): Secondary | ICD-10-CM | POA: Diagnosis not present

## 2019-05-16 DIAGNOSIS — K5900 Constipation, unspecified: Secondary | ICD-10-CM | POA: Diagnosis not present

## 2019-05-16 DIAGNOSIS — C3411 Malignant neoplasm of upper lobe, right bronchus or lung: Secondary | ICD-10-CM | POA: Diagnosis not present

## 2019-05-16 DIAGNOSIS — E1151 Type 2 diabetes mellitus with diabetic peripheral angiopathy without gangrene: Secondary | ICD-10-CM | POA: Diagnosis not present

## 2019-05-16 DIAGNOSIS — I5033 Acute on chronic diastolic (congestive) heart failure: Secondary | ICD-10-CM | POA: Diagnosis not present

## 2019-05-16 DIAGNOSIS — R21 Rash and other nonspecific skin eruption: Secondary | ICD-10-CM | POA: Diagnosis not present

## 2019-05-16 DIAGNOSIS — Z8673 Personal history of transient ischemic attack (TIA), and cerebral infarction without residual deficits: Secondary | ICD-10-CM | POA: Diagnosis not present

## 2019-05-16 DIAGNOSIS — J441 Chronic obstructive pulmonary disease with (acute) exacerbation: Secondary | ICD-10-CM | POA: Diagnosis not present

## 2019-05-16 NOTE — Telephone Encounter (Signed)
Left detailed message for Tillie Rung, with verbal okay.

## 2019-05-16 NOTE — Telephone Encounter (Signed)
Home Health Verbal Orders - Caller/Agency: Kendra/[facility]  Callback Number: [phone] secure vm can be left  Requesting PT extension  Frequency: 1 x a week for 1 week 2x's a week for 2 weeks 1 x a week for 2 weeks

## 2019-05-16 NOTE — Telephone Encounter (Signed)
ok 

## 2019-05-16 NOTE — Telephone Encounter (Signed)
Pt's husband called to report that pt's home therapy has extended for another 30 days  Home Health Verbal Orders - Caller/Agency: [facility]  Callback Number:  Requesting OT/PT/Skilled Nursing/Social Work/Speech Therapy: PT extended for another 30 days  Frequency:

## 2019-05-19 DIAGNOSIS — M4722 Other spondylosis with radiculopathy, cervical region: Secondary | ICD-10-CM | POA: Diagnosis not present

## 2019-05-19 DIAGNOSIS — Z7984 Long term (current) use of oral hypoglycemic drugs: Secondary | ICD-10-CM | POA: Diagnosis not present

## 2019-05-19 DIAGNOSIS — C3411 Malignant neoplasm of upper lobe, right bronchus or lung: Secondary | ICD-10-CM | POA: Diagnosis not present

## 2019-05-19 DIAGNOSIS — Z9181 History of falling: Secondary | ICD-10-CM | POA: Diagnosis not present

## 2019-05-19 DIAGNOSIS — M109 Gout, unspecified: Secondary | ICD-10-CM | POA: Diagnosis not present

## 2019-05-19 DIAGNOSIS — E1151 Type 2 diabetes mellitus with diabetic peripheral angiopathy without gangrene: Secondary | ICD-10-CM | POA: Diagnosis not present

## 2019-05-19 DIAGNOSIS — I110 Hypertensive heart disease with heart failure: Secondary | ICD-10-CM | POA: Diagnosis not present

## 2019-05-19 DIAGNOSIS — E43 Unspecified severe protein-calorie malnutrition: Secondary | ICD-10-CM | POA: Diagnosis not present

## 2019-05-19 DIAGNOSIS — K5900 Constipation, unspecified: Secondary | ICD-10-CM | POA: Diagnosis not present

## 2019-05-19 DIAGNOSIS — I2510 Atherosclerotic heart disease of native coronary artery without angina pectoris: Secondary | ICD-10-CM | POA: Diagnosis not present

## 2019-05-19 DIAGNOSIS — J441 Chronic obstructive pulmonary disease with (acute) exacerbation: Secondary | ICD-10-CM | POA: Diagnosis not present

## 2019-05-19 DIAGNOSIS — E1142 Type 2 diabetes mellitus with diabetic polyneuropathy: Secondary | ICD-10-CM | POA: Diagnosis not present

## 2019-05-19 DIAGNOSIS — I509 Heart failure, unspecified: Secondary | ICD-10-CM | POA: Diagnosis not present

## 2019-05-19 DIAGNOSIS — R21 Rash and other nonspecific skin eruption: Secondary | ICD-10-CM | POA: Diagnosis not present

## 2019-05-19 DIAGNOSIS — E538 Deficiency of other specified B group vitamins: Secondary | ICD-10-CM | POA: Diagnosis not present

## 2019-05-19 DIAGNOSIS — M6281 Muscle weakness (generalized): Secondary | ICD-10-CM | POA: Diagnosis not present

## 2019-05-19 DIAGNOSIS — I5033 Acute on chronic diastolic (congestive) heart failure: Secondary | ICD-10-CM | POA: Diagnosis not present

## 2019-05-19 DIAGNOSIS — Z87891 Personal history of nicotine dependence: Secondary | ICD-10-CM | POA: Diagnosis not present

## 2019-05-19 DIAGNOSIS — E785 Hyperlipidemia, unspecified: Secondary | ICD-10-CM | POA: Diagnosis not present

## 2019-05-19 DIAGNOSIS — Z9981 Dependence on supplemental oxygen: Secondary | ICD-10-CM | POA: Diagnosis not present

## 2019-05-19 DIAGNOSIS — I6529 Occlusion and stenosis of unspecified carotid artery: Secondary | ICD-10-CM | POA: Diagnosis not present

## 2019-05-19 DIAGNOSIS — Z7982 Long term (current) use of aspirin: Secondary | ICD-10-CM | POA: Diagnosis not present

## 2019-05-19 DIAGNOSIS — Z7902 Long term (current) use of antithrombotics/antiplatelets: Secondary | ICD-10-CM | POA: Diagnosis not present

## 2019-05-19 DIAGNOSIS — Z8673 Personal history of transient ischemic attack (TIA), and cerebral infarction without residual deficits: Secondary | ICD-10-CM | POA: Diagnosis not present

## 2019-05-22 DIAGNOSIS — Z9981 Dependence on supplemental oxygen: Secondary | ICD-10-CM | POA: Diagnosis not present

## 2019-05-22 DIAGNOSIS — J441 Chronic obstructive pulmonary disease with (acute) exacerbation: Secondary | ICD-10-CM | POA: Diagnosis not present

## 2019-05-22 DIAGNOSIS — E785 Hyperlipidemia, unspecified: Secondary | ICD-10-CM | POA: Diagnosis not present

## 2019-05-22 DIAGNOSIS — E1142 Type 2 diabetes mellitus with diabetic polyneuropathy: Secondary | ICD-10-CM | POA: Diagnosis not present

## 2019-05-22 DIAGNOSIS — M4722 Other spondylosis with radiculopathy, cervical region: Secondary | ICD-10-CM | POA: Diagnosis not present

## 2019-05-22 DIAGNOSIS — Z7902 Long term (current) use of antithrombotics/antiplatelets: Secondary | ICD-10-CM | POA: Diagnosis not present

## 2019-05-22 DIAGNOSIS — I509 Heart failure, unspecified: Secondary | ICD-10-CM | POA: Diagnosis not present

## 2019-05-22 DIAGNOSIS — C3411 Malignant neoplasm of upper lobe, right bronchus or lung: Secondary | ICD-10-CM | POA: Diagnosis not present

## 2019-05-22 DIAGNOSIS — K5900 Constipation, unspecified: Secondary | ICD-10-CM | POA: Diagnosis not present

## 2019-05-22 DIAGNOSIS — Z8673 Personal history of transient ischemic attack (TIA), and cerebral infarction without residual deficits: Secondary | ICD-10-CM | POA: Diagnosis not present

## 2019-05-22 DIAGNOSIS — I5033 Acute on chronic diastolic (congestive) heart failure: Secondary | ICD-10-CM | POA: Diagnosis not present

## 2019-05-22 DIAGNOSIS — Z7984 Long term (current) use of oral hypoglycemic drugs: Secondary | ICD-10-CM | POA: Diagnosis not present

## 2019-05-22 DIAGNOSIS — E1151 Type 2 diabetes mellitus with diabetic peripheral angiopathy without gangrene: Secondary | ICD-10-CM | POA: Diagnosis not present

## 2019-05-22 DIAGNOSIS — I2510 Atherosclerotic heart disease of native coronary artery without angina pectoris: Secondary | ICD-10-CM | POA: Diagnosis not present

## 2019-05-22 DIAGNOSIS — Z87891 Personal history of nicotine dependence: Secondary | ICD-10-CM | POA: Diagnosis not present

## 2019-05-22 DIAGNOSIS — I6529 Occlusion and stenosis of unspecified carotid artery: Secondary | ICD-10-CM | POA: Diagnosis not present

## 2019-05-22 DIAGNOSIS — Z9181 History of falling: Secondary | ICD-10-CM | POA: Diagnosis not present

## 2019-05-22 DIAGNOSIS — E43 Unspecified severe protein-calorie malnutrition: Secondary | ICD-10-CM | POA: Diagnosis not present

## 2019-05-22 DIAGNOSIS — Z7982 Long term (current) use of aspirin: Secondary | ICD-10-CM | POA: Diagnosis not present

## 2019-05-22 DIAGNOSIS — M109 Gout, unspecified: Secondary | ICD-10-CM | POA: Diagnosis not present

## 2019-05-22 DIAGNOSIS — E538 Deficiency of other specified B group vitamins: Secondary | ICD-10-CM | POA: Diagnosis not present

## 2019-05-22 DIAGNOSIS — M6281 Muscle weakness (generalized): Secondary | ICD-10-CM | POA: Diagnosis not present

## 2019-05-22 DIAGNOSIS — I110 Hypertensive heart disease with heart failure: Secondary | ICD-10-CM | POA: Diagnosis not present

## 2019-05-22 DIAGNOSIS — R21 Rash and other nonspecific skin eruption: Secondary | ICD-10-CM | POA: Diagnosis not present

## 2019-05-25 ENCOUNTER — Ambulatory Visit: Payer: Self-pay

## 2019-05-25 DIAGNOSIS — I2510 Atherosclerotic heart disease of native coronary artery without angina pectoris: Secondary | ICD-10-CM | POA: Diagnosis not present

## 2019-05-25 DIAGNOSIS — Z7984 Long term (current) use of oral hypoglycemic drugs: Secondary | ICD-10-CM | POA: Diagnosis not present

## 2019-05-25 DIAGNOSIS — E1142 Type 2 diabetes mellitus with diabetic polyneuropathy: Secondary | ICD-10-CM | POA: Diagnosis not present

## 2019-05-25 DIAGNOSIS — E785 Hyperlipidemia, unspecified: Secondary | ICD-10-CM | POA: Diagnosis not present

## 2019-05-25 DIAGNOSIS — Z8673 Personal history of transient ischemic attack (TIA), and cerebral infarction without residual deficits: Secondary | ICD-10-CM | POA: Diagnosis not present

## 2019-05-25 DIAGNOSIS — M4722 Other spondylosis with radiculopathy, cervical region: Secondary | ICD-10-CM | POA: Diagnosis not present

## 2019-05-25 DIAGNOSIS — C3411 Malignant neoplasm of upper lobe, right bronchus or lung: Secondary | ICD-10-CM | POA: Diagnosis not present

## 2019-05-25 DIAGNOSIS — Z87891 Personal history of nicotine dependence: Secondary | ICD-10-CM | POA: Diagnosis not present

## 2019-05-25 DIAGNOSIS — K5900 Constipation, unspecified: Secondary | ICD-10-CM | POA: Diagnosis not present

## 2019-05-25 DIAGNOSIS — Z7982 Long term (current) use of aspirin: Secondary | ICD-10-CM | POA: Diagnosis not present

## 2019-05-25 DIAGNOSIS — E43 Unspecified severe protein-calorie malnutrition: Secondary | ICD-10-CM | POA: Diagnosis not present

## 2019-05-25 DIAGNOSIS — E538 Deficiency of other specified B group vitamins: Secondary | ICD-10-CM | POA: Diagnosis not present

## 2019-05-25 DIAGNOSIS — J449 Chronic obstructive pulmonary disease, unspecified: Secondary | ICD-10-CM | POA: Diagnosis not present

## 2019-05-25 DIAGNOSIS — I110 Hypertensive heart disease with heart failure: Secondary | ICD-10-CM | POA: Diagnosis not present

## 2019-05-25 DIAGNOSIS — E1151 Type 2 diabetes mellitus with diabetic peripheral angiopathy without gangrene: Secondary | ICD-10-CM | POA: Diagnosis not present

## 2019-05-25 DIAGNOSIS — Z9981 Dependence on supplemental oxygen: Secondary | ICD-10-CM | POA: Diagnosis not present

## 2019-05-25 DIAGNOSIS — Z7902 Long term (current) use of antithrombotics/antiplatelets: Secondary | ICD-10-CM | POA: Diagnosis not present

## 2019-05-25 DIAGNOSIS — I6529 Occlusion and stenosis of unspecified carotid artery: Secondary | ICD-10-CM | POA: Diagnosis not present

## 2019-05-25 DIAGNOSIS — I509 Heart failure, unspecified: Secondary | ICD-10-CM | POA: Diagnosis not present

## 2019-05-25 DIAGNOSIS — Z9181 History of falling: Secondary | ICD-10-CM | POA: Diagnosis not present

## 2019-05-25 DIAGNOSIS — M109 Gout, unspecified: Secondary | ICD-10-CM | POA: Diagnosis not present

## 2019-05-25 NOTE — Telephone Encounter (Signed)
Please advise 

## 2019-05-25 NOTE — Telephone Encounter (Signed)
Ana from [facility] was given verbal order.

## 2019-05-25 NOTE — Telephone Encounter (Signed)
Ana, Physical Therapist with [facility] saw pt. This morning at pt.'s home. BP 162/88. No symptoms. Reports pt. Fell 1 week ago, injured her left hip and she believes the pain is causing the elevated BP. Pt. Has a "bruise and a bump on her greater trochanter." Requesting an order for a portable hip xray in the home. Please advise Ana. Contact # [phone].  Answer Assessment - Initial Assessment Questions 1. BLOOD PRESSURE: "What is the blood pressure?" "Did you take at least two measurements 5 minutes apart?"     162/88 2. ONSET: "When did you take your blood pressure?"     This morning 3. HOW: "How did you obtain the blood pressure?" (e.g., visiting nurse, automatic home BP monitor)     Cuff 4. HISTORY: "Do you have a history of high blood pressure?"     Yes 5. MEDICATIONS: "Are you taking any medications for blood pressure?" "Have you missed any doses recently?"     No missed doses 6. OTHER SYMPTOMS: "Do you have any symptoms?" (e.g., headache, chest pain, blurred vision, difficulty breathing, weakness)     No 7. PREGNANCY: "Is there any chance you are pregnant?" "When was your last menstrual period?"     No  Protocols used: HIGH BLOOD PRESSURE-A-AH

## 2019-05-25 NOTE — Telephone Encounter (Signed)
Ok if that can be done.

## 2019-05-26 DIAGNOSIS — M25552 Pain in left hip: Secondary | ICD-10-CM | POA: Diagnosis not present

## 2019-05-27 DIAGNOSIS — J449 Chronic obstructive pulmonary disease, unspecified: Secondary | ICD-10-CM | POA: Diagnosis not present

## 2019-05-27 DIAGNOSIS — R0602 Shortness of breath: Secondary | ICD-10-CM | POA: Diagnosis not present

## 2019-05-28 ENCOUNTER — Ambulatory Visit: Payer: Medicare Other

## 2019-05-29 ENCOUNTER — Ambulatory Visit: Payer: Medicare Other | Attending: Internal Medicine

## 2019-05-29 ENCOUNTER — Other Ambulatory Visit: Payer: Self-pay

## 2019-05-29 DIAGNOSIS — J449 Chronic obstructive pulmonary disease, unspecified: Secondary | ICD-10-CM | POA: Diagnosis not present

## 2019-05-29 DIAGNOSIS — Z23 Encounter for immunization: Secondary | ICD-10-CM

## 2019-05-29 NOTE — Progress Notes (Signed)
   Covid-19 Vaccination Clinic  Name:  [name] [name]    MRN: [id number] DOB: Nov 06, 1940  05/29/2019  [name] was observed post Covid-19 immunization for 15 minutes without incidence. She was provided with Vaccine Information Sheet and instruction to access the V-Safe system.   [name] was instructed to call 911 with any severe reactions post vaccine: Marland Kitchen Difficulty breathing  . Swelling of your face and throat  . A fast heartbeat  . A bad rash all over your body  . Dizziness and weakness    Immunizations Administered    Name Date Dose VIS Date Route   Pfizer COVID-19 Vaccine 05/29/2019  9:44 AM 0.3 mL 03/17/2019 Intramuscular   Manufacturer: [employer]   Lot: [id number]   [place]: [address]-8

## 2019-05-30 DIAGNOSIS — Z9981 Dependence on supplemental oxygen: Secondary | ICD-10-CM | POA: Diagnosis not present

## 2019-05-30 DIAGNOSIS — E1151 Type 2 diabetes mellitus with diabetic peripheral angiopathy without gangrene: Secondary | ICD-10-CM | POA: Diagnosis not present

## 2019-05-30 DIAGNOSIS — C3411 Malignant neoplasm of upper lobe, right bronchus or lung: Secondary | ICD-10-CM | POA: Diagnosis not present

## 2019-05-30 DIAGNOSIS — Z8673 Personal history of transient ischemic attack (TIA), and cerebral infarction without residual deficits: Secondary | ICD-10-CM | POA: Diagnosis not present

## 2019-05-30 DIAGNOSIS — I110 Hypertensive heart disease with heart failure: Secondary | ICD-10-CM | POA: Diagnosis not present

## 2019-05-30 DIAGNOSIS — M4722 Other spondylosis with radiculopathy, cervical region: Secondary | ICD-10-CM | POA: Diagnosis not present

## 2019-05-30 DIAGNOSIS — K5900 Constipation, unspecified: Secondary | ICD-10-CM | POA: Diagnosis not present

## 2019-05-30 DIAGNOSIS — Z7902 Long term (current) use of antithrombotics/antiplatelets: Secondary | ICD-10-CM | POA: Diagnosis not present

## 2019-05-30 DIAGNOSIS — Z7984 Long term (current) use of oral hypoglycemic drugs: Secondary | ICD-10-CM | POA: Diagnosis not present

## 2019-05-30 DIAGNOSIS — E785 Hyperlipidemia, unspecified: Secondary | ICD-10-CM | POA: Diagnosis not present

## 2019-05-30 DIAGNOSIS — I6529 Occlusion and stenosis of unspecified carotid artery: Secondary | ICD-10-CM | POA: Diagnosis not present

## 2019-05-30 DIAGNOSIS — I509 Heart failure, unspecified: Secondary | ICD-10-CM | POA: Diagnosis not present

## 2019-05-30 DIAGNOSIS — E43 Unspecified severe protein-calorie malnutrition: Secondary | ICD-10-CM | POA: Diagnosis not present

## 2019-05-30 DIAGNOSIS — M109 Gout, unspecified: Secondary | ICD-10-CM | POA: Diagnosis not present

## 2019-05-30 DIAGNOSIS — E538 Deficiency of other specified B group vitamins: Secondary | ICD-10-CM | POA: Diagnosis not present

## 2019-05-30 DIAGNOSIS — Z9181 History of falling: Secondary | ICD-10-CM | POA: Diagnosis not present

## 2019-05-30 DIAGNOSIS — E1142 Type 2 diabetes mellitus with diabetic polyneuropathy: Secondary | ICD-10-CM | POA: Diagnosis not present

## 2019-05-30 DIAGNOSIS — Z87891 Personal history of nicotine dependence: Secondary | ICD-10-CM | POA: Diagnosis not present

## 2019-05-30 DIAGNOSIS — I2510 Atherosclerotic heart disease of native coronary artery without angina pectoris: Secondary | ICD-10-CM | POA: Diagnosis not present

## 2019-05-30 DIAGNOSIS — J449 Chronic obstructive pulmonary disease, unspecified: Secondary | ICD-10-CM | POA: Diagnosis not present

## 2019-05-30 DIAGNOSIS — Z7982 Long term (current) use of aspirin: Secondary | ICD-10-CM | POA: Diagnosis not present

## 2019-06-02 ENCOUNTER — Other Ambulatory Visit: Payer: Self-pay | Admitting: Family Medicine

## 2019-06-02 DIAGNOSIS — E43 Unspecified severe protein-calorie malnutrition: Secondary | ICD-10-CM | POA: Diagnosis not present

## 2019-06-02 DIAGNOSIS — C3411 Malignant neoplasm of upper lobe, right bronchus or lung: Secondary | ICD-10-CM | POA: Diagnosis not present

## 2019-06-02 DIAGNOSIS — E1151 Type 2 diabetes mellitus with diabetic peripheral angiopathy without gangrene: Secondary | ICD-10-CM | POA: Diagnosis not present

## 2019-06-02 DIAGNOSIS — K5900 Constipation, unspecified: Secondary | ICD-10-CM | POA: Diagnosis not present

## 2019-06-02 DIAGNOSIS — E1142 Type 2 diabetes mellitus with diabetic polyneuropathy: Secondary | ICD-10-CM | POA: Diagnosis not present

## 2019-06-02 DIAGNOSIS — Z7982 Long term (current) use of aspirin: Secondary | ICD-10-CM | POA: Diagnosis not present

## 2019-06-02 DIAGNOSIS — M109 Gout, unspecified: Secondary | ICD-10-CM | POA: Diagnosis not present

## 2019-06-02 DIAGNOSIS — E538 Deficiency of other specified B group vitamins: Secondary | ICD-10-CM | POA: Diagnosis not present

## 2019-06-02 DIAGNOSIS — I2510 Atherosclerotic heart disease of native coronary artery without angina pectoris: Secondary | ICD-10-CM | POA: Diagnosis not present

## 2019-06-02 DIAGNOSIS — I6529 Occlusion and stenosis of unspecified carotid artery: Secondary | ICD-10-CM | POA: Diagnosis not present

## 2019-06-02 DIAGNOSIS — Z8673 Personal history of transient ischemic attack (TIA), and cerebral infarction without residual deficits: Secondary | ICD-10-CM | POA: Diagnosis not present

## 2019-06-02 DIAGNOSIS — Z7902 Long term (current) use of antithrombotics/antiplatelets: Secondary | ICD-10-CM | POA: Diagnosis not present

## 2019-06-02 DIAGNOSIS — Z87891 Personal history of nicotine dependence: Secondary | ICD-10-CM | POA: Diagnosis not present

## 2019-06-02 DIAGNOSIS — J449 Chronic obstructive pulmonary disease, unspecified: Secondary | ICD-10-CM | POA: Diagnosis not present

## 2019-06-02 DIAGNOSIS — E785 Hyperlipidemia, unspecified: Secondary | ICD-10-CM | POA: Diagnosis not present

## 2019-06-02 DIAGNOSIS — Z7984 Long term (current) use of oral hypoglycemic drugs: Secondary | ICD-10-CM | POA: Diagnosis not present

## 2019-06-02 DIAGNOSIS — M4722 Other spondylosis with radiculopathy, cervical region: Secondary | ICD-10-CM | POA: Diagnosis not present

## 2019-06-02 DIAGNOSIS — Z9981 Dependence on supplemental oxygen: Secondary | ICD-10-CM | POA: Diagnosis not present

## 2019-06-02 DIAGNOSIS — I110 Hypertensive heart disease with heart failure: Secondary | ICD-10-CM | POA: Diagnosis not present

## 2019-06-02 DIAGNOSIS — I509 Heart failure, unspecified: Secondary | ICD-10-CM | POA: Diagnosis not present

## 2019-06-02 DIAGNOSIS — Z9181 History of falling: Secondary | ICD-10-CM | POA: Diagnosis not present

## 2019-06-02 MED ORDER — BLOOD GLUCOSE METER KIT: PACK | 0 refills | Status: DC

## 2019-06-02 NOTE — Telephone Encounter (Signed)
Rx sent to pharmacy   

## 2019-06-02 NOTE — Telephone Encounter (Signed)
Pts husband called and is requesting to have a complete kit and meter sent in for her. He states the pt has not been able to get her blood sugar levels checked in a few days. Please advise.     [id number] [place] [address] [place] [address] Phone: [phone]: [phone] Not a 24 hour pharmacy; exact hours not known.

## 2019-06-06 ENCOUNTER — Emergency Department (HOSPITAL_COMMUNITY): Payer: Medicare Other

## 2019-06-06 ENCOUNTER — Ambulatory Visit (INDEPENDENT_AMBULATORY_CARE_PROVIDER_SITE_OTHER): Payer: Medicare Other | Admitting: Physician Assistant

## 2019-06-06 ENCOUNTER — Encounter (HOSPITAL_COMMUNITY): Payer: Self-pay | Admitting: Internal Medicine

## 2019-06-06 ENCOUNTER — Observation Stay (HOSPITAL_COMMUNITY)
Admission: EM | Admit: 2019-06-06 | Discharge: 2019-06-07 | Disposition: A | Discharge status: Home / Self Care | Payer: Medicare Other | Attending: Family Medicine | Admitting: Family Medicine

## 2019-06-06 DIAGNOSIS — E785 Hyperlipidemia, unspecified: Secondary | ICD-10-CM | POA: Insufficient documentation

## 2019-06-06 DIAGNOSIS — R079 Chest pain, unspecified: Secondary | ICD-10-CM

## 2019-06-06 DIAGNOSIS — Z7984 Long term (current) use of oral hypoglycemic drugs: Secondary | ICD-10-CM | POA: Diagnosis not present

## 2019-06-06 DIAGNOSIS — Z87891 Personal history of nicotine dependence: Secondary | ICD-10-CM | POA: Insufficient documentation

## 2019-06-06 DIAGNOSIS — C3411 Malignant neoplasm of upper lobe, right bronchus or lung: Secondary | ICD-10-CM | POA: Diagnosis present

## 2019-06-06 DIAGNOSIS — J449 Chronic obstructive pulmonary disease, unspecified: Secondary | ICD-10-CM | POA: Diagnosis present

## 2019-06-06 DIAGNOSIS — Z8673 Personal history of transient ischemic attack (TIA), and cerebral infarction without residual deficits: Secondary | ICD-10-CM | POA: Insufficient documentation

## 2019-06-06 DIAGNOSIS — R7989 Other specified abnormal findings of blood chemistry: Secondary | ICD-10-CM | POA: Diagnosis not present

## 2019-06-06 DIAGNOSIS — I129 Hypertensive chronic kidney disease with stage 1 through stage 4 chronic kidney disease, or unspecified chronic kidney disease: Secondary | ICD-10-CM | POA: Diagnosis not present

## 2019-06-06 DIAGNOSIS — Z7902 Long term (current) use of antithrombotics/antiplatelets: Secondary | ICD-10-CM | POA: Diagnosis not present

## 2019-06-06 DIAGNOSIS — Z79899 Other long term (current) drug therapy: Secondary | ICD-10-CM | POA: Diagnosis not present

## 2019-06-06 DIAGNOSIS — Z7982 Long term (current) use of aspirin: Secondary | ICD-10-CM | POA: Insufficient documentation

## 2019-06-06 DIAGNOSIS — R0781 Pleurodynia: Secondary | ICD-10-CM | POA: Insufficient documentation

## 2019-06-06 DIAGNOSIS — R059 Cough, unspecified: Secondary | ICD-10-CM

## 2019-06-06 DIAGNOSIS — R29810 Facial weakness: Secondary | ICD-10-CM | POA: Diagnosis not present

## 2019-06-06 DIAGNOSIS — Z923 Personal history of irradiation: Secondary | ICD-10-CM | POA: Diagnosis not present

## 2019-06-06 DIAGNOSIS — Z66 Do not resuscitate: Secondary | ICD-10-CM | POA: Diagnosis not present

## 2019-06-06 DIAGNOSIS — I10 Essential (primary) hypertension: Secondary | ICD-10-CM | POA: Diagnosis present

## 2019-06-06 DIAGNOSIS — R0602 Shortness of breath: Secondary | ICD-10-CM | POA: Diagnosis not present

## 2019-06-06 DIAGNOSIS — Z9981 Dependence on supplemental oxygen: Secondary | ICD-10-CM | POA: Diagnosis not present

## 2019-06-06 DIAGNOSIS — Z743 Need for continuous supervision: Secondary | ICD-10-CM | POA: Diagnosis not present

## 2019-06-06 DIAGNOSIS — R05 Cough: Secondary | ICD-10-CM | POA: Diagnosis not present

## 2019-06-06 DIAGNOSIS — N184 Chronic kidney disease, stage 4 (severe): Secondary | ICD-10-CM | POA: Insufficient documentation

## 2019-06-06 DIAGNOSIS — Z85118 Personal history of other malignant neoplasm of bronchus and lung: Secondary | ICD-10-CM | POA: Diagnosis not present

## 2019-06-06 DIAGNOSIS — J9601 Acute respiratory failure with hypoxia: Secondary | ICD-10-CM | POA: Diagnosis not present

## 2019-06-06 DIAGNOSIS — Z20822 Contact with and (suspected) exposure to covid-19: Secondary | ICD-10-CM | POA: Insufficient documentation

## 2019-06-06 DIAGNOSIS — R410 Disorientation, unspecified: Secondary | ICD-10-CM | POA: Diagnosis not present

## 2019-06-06 DIAGNOSIS — I2510 Atherosclerotic heart disease of native coronary artery without angina pectoris: Secondary | ICD-10-CM | POA: Diagnosis not present

## 2019-06-06 DIAGNOSIS — D631 Anemia in chronic kidney disease: Secondary | ICD-10-CM | POA: Insufficient documentation

## 2019-06-06 DIAGNOSIS — J441 Chronic obstructive pulmonary disease with (acute) exacerbation: Principal | ICD-10-CM | POA: Diagnosis present

## 2019-06-06 DIAGNOSIS — R0902 Hypoxemia: Secondary | ICD-10-CM | POA: Diagnosis not present

## 2019-06-06 DIAGNOSIS — Z9221 Personal history of antineoplastic chemotherapy: Secondary | ICD-10-CM | POA: Insufficient documentation

## 2019-06-06 DIAGNOSIS — R0603 Acute respiratory distress: Secondary | ICD-10-CM | POA: Diagnosis not present

## 2019-06-06 DIAGNOSIS — E1142 Type 2 diabetes mellitus with diabetic polyneuropathy: Secondary | ICD-10-CM | POA: Insufficient documentation

## 2019-06-06 LAB — POC SARS CORONAVIRUS 2 AG -  ED: SARS Coronavirus 2 Ag: NEGATIVE

## 2019-06-06 LAB — COMPREHENSIVE METABOLIC PANEL
ALT: 19 U/L (ref 0–44)
AST: 15 U/L (ref 15–41)
Albumin: 3.3 g/dL — ABNORMAL LOW (ref 3.5–5.0)
Alkaline Phosphatase: 61 U/L (ref 38–126)
Anion gap: 11 (ref 5–15)
BUN: 24 mg/dL — ABNORMAL HIGH (ref 8–23)
CO2: 25 mmol/L (ref 22–32)
Calcium: 8.6 mg/dL — ABNORMAL LOW (ref 8.9–10.3)
Chloride: 103 mmol/L (ref 98–111)
Creatinine, Ser: 1.73 mg/dL — ABNORMAL HIGH (ref 0.44–1.00)
GFR calc Af Amer: 32 mL/min — ABNORMAL LOW (ref >60)
GFR calc non Af Amer: 28 mL/min — ABNORMAL LOW (ref >60)
Glucose, Bld: 159 mg/dL — ABNORMAL HIGH (ref 70–99)
Potassium: 4.6 mmol/L (ref 3.5–5.1)
Sodium: 139 mmol/L (ref 135–145)
Total Bilirubin: 0.5 mg/dL (ref 0.3–1.2)
Total Protein: 6.3 g/dL — ABNORMAL LOW (ref 6.5–8.1)

## 2019-06-06 LAB — POCT I-STAT EG7
Acid-Base Excess: 1 mmol/L (ref 0.0–2.0)
Bicarbonate: 24.6 mmol/L (ref 20.0–28.0)
Calcium, Ion: 0.95 mmol/L — ABNORMAL LOW (ref 1.15–1.40)
HCT: 22 % — ABNORMAL LOW (ref 36.0–46.0)
Hemoglobin: 7.5 g/dL — ABNORMAL LOW (ref 12.0–15.0)
O2 Saturation: 100 %
Potassium: 3.6 mmol/L (ref 3.5–5.1)
Sodium: 143 mmol/L (ref 135–145)
TCO2: 26 mmol/L (ref 22–32)
pCO2, Ven: 36 mmHg — ABNORMAL LOW (ref 44.0–60.0)
pH, Ven: 7.443 — ABNORMAL HIGH (ref 7.250–7.430)
pO2, Ven: 183 mmHg — ABNORMAL HIGH (ref 32.0–45.0)

## 2019-06-06 LAB — CREATININE, SERUM
Creatinine, Ser: 1.39 mg/dL — ABNORMAL HIGH (ref 0.44–1.00)
GFR calc Af Amer: 42 mL/min — ABNORMAL LOW (ref >60)
GFR calc non Af Amer: 36 mL/min — ABNORMAL LOW (ref >60)

## 2019-06-06 LAB — D-DIMER, QUANTITATIVE: D-Dimer, Quant: 1.42 ug/mL-FEU — ABNORMAL HIGH (ref 0.00–0.50)

## 2019-06-06 LAB — CBC WITH DIFFERENTIAL/PLATELET
Abs Immature Granulocytes: 0.04 10*3/uL (ref 0.00–0.07)
Basophils Absolute: 0 10*3/uL (ref 0.0–0.1)
Basophils Relative: 0 %
Eosinophils Absolute: 0 10*3/uL (ref 0.0–0.5)
Eosinophils Relative: 0 %
HCT: 31.6 % — ABNORMAL LOW (ref 36.0–46.0)
Hemoglobin: 9.8 g/dL — ABNORMAL LOW (ref 12.0–15.0)
Immature Granulocytes: 0 %
Lymphocytes Relative: 6 %
Lymphs Abs: 0.6 10*3/uL — ABNORMAL LOW (ref 0.7–4.0)
MCH: 30.5 pg (ref 26.0–34.0)
MCHC: 31 g/dL (ref 30.0–36.0)
MCV: 98.4 fL (ref 80.0–100.0)
Monocytes Absolute: 0.7 10*3/uL (ref 0.1–1.0)
Monocytes Relative: 6 %
Neutro Abs: 9.1 10*3/uL — ABNORMAL HIGH (ref 1.7–7.7)
Neutrophils Relative %: 88 %
Platelets: 226 10*3/uL (ref 150–400)
RBC: 3.21 MIL/uL — ABNORMAL LOW (ref 3.87–5.11)
RDW: 16 % — ABNORMAL HIGH (ref 11.5–15.5)
WBC: 10.5 10*3/uL (ref 4.0–10.5)
nRBC: 0 % (ref 0.0–0.2)

## 2019-06-06 LAB — BRAIN NATRIURETIC PEPTIDE: B Natriuretic Peptide: 127.7 pg/mL — ABNORMAL HIGH (ref 0.0–100.0)

## 2019-06-06 LAB — CBC
HCT: 33.5 % — ABNORMAL LOW (ref 36.0–46.0)
Hemoglobin: 10.4 g/dL — ABNORMAL LOW (ref 12.0–15.0)
MCH: 30.4 pg (ref 26.0–34.0)
MCHC: 31 g/dL (ref 30.0–36.0)
MCV: 98 fL (ref 80.0–100.0)
Platelets: 239 10*3/uL (ref 150–400)
RBC: 3.42 MIL/uL — ABNORMAL LOW (ref 3.87–5.11)
RDW: 16 % — ABNORMAL HIGH (ref 11.5–15.5)
WBC: 11.5 10*3/uL — ABNORMAL HIGH (ref 4.0–10.5)
nRBC: 0 % (ref 0.0–0.2)

## 2019-06-06 LAB — TROPONIN I (HIGH SENSITIVITY)
Troponin I (High Sensitivity): 7 ng/L (ref <18)
Troponin I (High Sensitivity): 11 ng/L (ref <18)
Troponin I (High Sensitivity): 9 ng/L (ref <18)

## 2019-06-06 MED ORDER — ATORVASTATIN CALCIUM 40 MG PO TABS
40.0000 mg | ORAL_TABLET | Freq: Every day | ORAL | Status: DC
  Administered 2019-06-07: 40 mg via ORAL
  Filled 2019-06-06: qty 1

## 2019-06-06 MED ORDER — AMLODIPINE BESYLATE 5 MG PO TABS
5.0000 mg | ORAL_TABLET | Freq: Every day | ORAL | Status: DC
  Administered 2019-06-07: 5 mg via ORAL
  Filled 2019-06-06: qty 1

## 2019-06-06 MED ORDER — ENOXAPARIN SODIUM 40 MG/0.4ML ~~LOC~~ SOLN: 40.0000 mg | SUBCUTANEOUS | Status: DC

## 2019-06-06 MED ORDER — ONDANSETRON HCL 4 MG PO TABS: 4.0000 mg | ORAL_TABLET | Freq: Four times a day (QID) | ORAL | Status: DC | PRN

## 2019-06-06 MED ORDER — ISOSORBIDE MONONITRATE ER 30 MG PO TB24
30.0000 mg | ORAL_TABLET | Freq: Every day | ORAL | Status: DC
  Administered 2019-06-07: 30 mg via ORAL
  Filled 2019-06-06: qty 1

## 2019-06-06 MED ORDER — ACETAMINOPHEN 650 MG RE SUPP: 650.0000 mg | Freq: Four times a day (QID) | RECTAL | Status: DC | PRN

## 2019-06-06 MED ORDER — INSULIN ASPART 100 UNIT/ML ~~LOC~~ SOLN
0.0000 [IU] | Freq: Three times a day (TID) | SUBCUTANEOUS | Status: DC
  Administered 2019-06-07 (×2): 5 [IU] via SUBCUTANEOUS
  Administered 2019-06-07: 7 [IU] via SUBCUTANEOUS

## 2019-06-06 MED ORDER — MOMETASONE FURO-FORMOTEROL FUM 100-5 MCG/ACT IN AERO
2.0000 | INHALATION_SPRAY | Freq: Two times a day (BID) | RESPIRATORY_TRACT | Status: DC
  Administered 2019-06-07: 2 via RESPIRATORY_TRACT
  Filled 2019-06-06: qty 8.8

## 2019-06-06 MED ORDER — ASPIRIN EC 81 MG PO TBEC
81.0000 mg | DELAYED_RELEASE_TABLET | Freq: Every day | ORAL | Status: DC
  Administered 2019-06-07: 81 mg via ORAL
  Filled 2019-06-06: qty 1

## 2019-06-06 MED ORDER — DOXYCYCLINE HYCLATE 100 MG PO TABS: 100.0000 mg | ORAL_TABLET | Freq: Two times a day (BID) | ORAL | 0 refills | Status: AC

## 2019-06-06 MED ORDER — PREDNISONE 20 MG PO TABS: 40.0000 mg | ORAL_TABLET | Freq: Every day | ORAL | 0 refills | Status: AC

## 2019-06-06 MED ORDER — METOPROLOL SUCCINATE ER 100 MG PO TB24
100.0000 mg | ORAL_TABLET | Freq: Every day | ORAL | Status: DC
  Administered 2019-06-07: 100 mg via ORAL
  Filled 2019-06-06: qty 1

## 2019-06-06 MED ORDER — IPRATROPIUM-ALBUTEROL 0.5-2.5 (3) MG/3ML IN SOLN: 3.0000 mL | RESPIRATORY_TRACT | 1 refills | Status: DC | PRN

## 2019-06-06 MED ORDER — ACETAMINOPHEN 325 MG PO TABS: 650.0000 mg | ORAL_TABLET | Freq: Four times a day (QID) | ORAL | Status: DC | PRN

## 2019-06-06 MED ORDER — METHYLPREDNISOLONE SODIUM SUCC 40 MG IJ SOLR
40.0000 mg | Freq: Two times a day (BID) | INTRAMUSCULAR | Status: DC
  Administered 2019-06-07 (×2): 40 mg via INTRAVENOUS
  Filled 2019-06-06 (×2): qty 1

## 2019-06-06 MED ORDER — DOXYCYCLINE HYCLATE 100 MG PO TABS
100.0000 mg | ORAL_TABLET | Freq: Two times a day (BID) | ORAL | Status: DC
  Administered 2019-06-07 (×2): 100 mg via ORAL
  Filled 2019-06-06 (×2): qty 1

## 2019-06-06 MED ORDER — PANTOPRAZOLE SODIUM 40 MG PO TBEC
40.0000 mg | DELAYED_RELEASE_TABLET | Freq: Two times a day (BID) | ORAL | Status: DC
  Administered 2019-06-07 (×2): 40 mg via ORAL
  Filled 2019-06-06 (×2): qty 1

## 2019-06-06 MED ORDER — ALBUTEROL SULFATE (2.5 MG/3ML) 0.083% IN NEBU
2.5000 mg | INHALATION_SOLUTION | RESPIRATORY_TRACT | Status: DC
  Administered 2019-06-07 (×2): 2.5 mg via RESPIRATORY_TRACT
  Filled 2019-06-06 (×2): qty 3

## 2019-06-06 MED ORDER — MAGNESIUM OXIDE 400 (241.3 MG) MG PO TABS
400.0000 mg | ORAL_TABLET | Freq: Two times a day (BID) | ORAL | Status: DC
  Administered 2019-06-07 (×2): 400 mg via ORAL
  Filled 2019-06-06 (×2): qty 1

## 2019-06-06 MED ORDER — METHYLPREDNISOLONE SODIUM SUCC 125 MG IJ SOLR
125.0000 mg | Freq: Once | INTRAMUSCULAR | Status: AC
  Administered 2019-06-06: 125 mg via INTRAVENOUS
  Filled 2019-06-06: qty 2

## 2019-06-06 MED ORDER — CLOPIDOGREL BISULFATE 75 MG PO TABS
75.0000 mg | ORAL_TABLET | Freq: Every day | ORAL | Status: DC
  Administered 2019-06-07: 75 mg via ORAL
  Filled 2019-06-06: qty 1

## 2019-06-06 MED ORDER — ENOXAPARIN SODIUM 30 MG/0.3ML ~~LOC~~ SOLN
30.0000 mg | SUBCUTANEOUS | Status: DC
  Administered 2019-06-07: 30 mg via SUBCUTANEOUS
  Filled 2019-06-06: qty 0.3

## 2019-06-06 MED ORDER — ALBUTEROL SULFATE HFA 108 (90 BASE) MCG/ACT IN AERS
2.0000 | INHALATION_SPRAY | Freq: Once | RESPIRATORY_TRACT | Status: AC
  Administered 2019-06-06: 2 via RESPIRATORY_TRACT
  Filled 2019-06-06: qty 6.7

## 2019-06-06 MED ORDER — ONDANSETRON HCL 4 MG/2ML IJ SOLN: 4.0000 mg | Freq: Four times a day (QID) | INTRAMUSCULAR | Status: DC | PRN

## 2019-06-06 MED ORDER — NITROGLYCERIN 0.4 MG SL SUBL: 0.4000 mg | SUBLINGUAL_TABLET | SUBLINGUAL | Status: DC | PRN

## 2019-06-06 NOTE — ED Provider Notes (Signed)
Emergency Department Provider Note   I have reviewed the triage vital signs and the nursing notes.   HISTORY  Chief Complaint Shortness of Breath and Weakness   HPI [name] [name] is a 79 y.o. female with PMH of COPD on 3L [place] at night, CAD, DM, HLD, and prior CVA presents to the ED with SOB and generalized weakness worsening over the last 3 days.  Patient states that she has developed constant central chest tightness starting today with no radiation of symptoms or modifying factors.  She tells me that she has tested positive for Covid and believes it was recent but cannot remember exactly when.  She has not had fevers or shaking chills.  He tells me that her family became concerned regarding her shortness of breath and weakness and that she saw her primary care doctor on a televisit today.  They urged her to come to the emergency department and she ultimately agreed with family to come for evaluation.  She has been wearing her oxygen 24 hours a day for the past 3 days which is unusual.  She denies any unilateral weakness or numbness.  Per EMS there was some report/concern for possible stroke but patient describes feeling weak in all extremities rather than unilateral. Denies numbness, vision change, voice change, or difficulty swallowing.    Past Medical History:  Diagnosis Date  . Abnormal CT lung screening 10/17/2015  . COPD (chronic obstructive pulmonary disease) ([place])   . Coronary artery disease, non-occlusive    a. cath 2006: min nonobs CAD; b. cath 12/2010: cath LAD 50%, RCA 60%; c. 08/2013: Minimal luminal irregs, right dominant system with no significant CAD, diffuse luminal irregs noted. Normal EF 55%, no AS or MS.   . Diabetes mellitus   . Hyperlipemia    Followed by Dr. Rosanna Randy  . Hypertension   . Lung cancer ([place])   . Macular degeneration    rt  . Personal history of tobacco use, presenting hazards to health 10/15/2015  . Stroke [facility])     Patient Active Problem List   Diagnosis Date Noted  . COPD (chronic obstructive pulmonary disease) ([place]) 06/06/2019  . GI bleed 12/28/2018  . Syncope 12/27/2018  . Blood per rectum 12/27/2018  . Constipation 12/27/2018  . Chest pain 09/23/2018  . Stroke (cerebrum) ([place]) 05/06/2018  . Malnutrition of moderate degree 03/31/2017  . Acute respiratory failure with hypoxia ([place]) 03/30/2017  . AKI (acute kidney injury) ([place]) 07/15/2016  . Protein-calorie malnutrition, severe 02/08/2016  . Primary cancer of right upper lobe of lung ([place]) 11/20/2015  . Abnormal CT lung screening 10/17/2015  . Personal history of tobacco use, presenting hazards to health 10/15/2015  . Chronic vulvitis 09/26/2014  . Allergic reaction 09/26/2014  . Carotid stenosis 08/30/2014  . Cervical nerve root disorder 08/10/2014  . CAD in native artery 08/10/2014  . B12 deficiency 08/10/2014  . Back ache 08/10/2014  . Bronchitis, chronic ([place]) 08/10/2014  . Diabetes mellitus with polyneuropathy ([place]) 08/10/2014  . Can't get food down 08/10/2014  . Eczema of external ear 08/10/2014  . Accumulation of fluid in tissues 08/10/2014  . Gout 08/10/2014  . Adult hypothyroidism 08/10/2014  . Mononeuritis 08/10/2014  . Muscle ache 08/10/2014  . Disorder of peripheral nervous system 08/10/2014  . Lesion of vulva 08/10/2014  . Cervical spondylosis with radiculopathy 10/19/2013  . COPD exacerbation ([place]) 03/29/2013  . CAD (coronary artery disease) 06/22/2011  . COPD (chronic obstructive pulmonary disease) with emphysema ([place]) 03/07/2010  .  CHEST PAIN UNSPECIFIED 07/22/2009  . HLD (hyperlipidemia) 04/01/2009  . Malaise and fatigue 04/01/2009  . TOBACCO ABUSE 01/18/2009  . HYPERTENSION, BENIGN 01/18/2009  . CLAUDICATION 01/18/2009  . Pain in limb 01/18/2009  . CAFL (chronic airflow limitation) ([place]) 01/20/2007  . Late effects of cerebrovascular disease 01/10/2007  . Essential (primary) hypertension 12/22/2006    Past Surgical History:  Procedure  Laterality Date  . ABDOMINAL HYSTERECTOMY    . ANTERIOR CERVICAL DECOMP/DISCECTOMY FUSION N/A 10/19/2013   Procedure: CERVICAL FIVE-SIX ANTERIOR CERVICAL DECOMPRESSION WITH FUSION INTERBODY PROSTHESIS PLATING AND PEEK CAGE;  Surgeon: Ophelia Charter, MD;  Location: [place] [facility]URO ORS;  Service: Neurosurgery;  Laterality: N/A;  . BACK SURGERY  80's  . BREAST CYST EXCISION Left    left negative   . CARDIAC CATHETERIZATION  05/2004  . CATARACT EXTRACTION Left   . CHOLECYSTECTOMY    . COLONOSCOPY N/A 12/29/2018   Procedure: COLONOSCOPY;  Surgeon: Toledo, Benay Pike, MD;  Location: [facility] ENDOSCOPY;  Service: Gastroenterology;  Laterality: N/A;  . ENDARTERECTOMY Left 10/24/2014   Procedure: ENDARTERECTOMY CAROTID;  Surgeon: Algernon Huxley, MD;  Location: [facility] ORS;  Service: Vascular;  Laterality: Left;  . ENDOBRONCHIAL ULTRASOUND N/A 11/14/2015   Procedure: ENDOBRONCHIAL ULTRASOUND;  Surgeon: [name], MD;  Location: [facility] ORS;  Service: Cardiopulmonary;  Laterality: N/A;  . PERIPHERAL VASCULAR CATHETERIZATION N/A 12/04/2015   Procedure: Glori Luis Cath Insertion;  Surgeon: Algernon Huxley, MD;  Location: [place] CV LAB;  Service: Cardiovascular;  Laterality: N/A;  . PORTA CATH REMOVAL N/A 05/17/2017   Procedure: PORTA CATH REMOVAL;  Surgeon: Algernon Huxley, MD;  Location: [place] CV LAB;  Service: Cardiovascular;  Laterality: N/A;  . TONSILLECTOMY AND ADENOIDECTOMY    . VESICOVAGINAL FISTULA CLOSURE W/ TAH      Allergies Coconut fatty acids  Family History  Problem Relation Age of Onset  . Stroke Mother   . Heart attack Father   . Heart attack Brother   . Heart attack Brother   . Lung cancer Maternal Grandfather   . Heart attack Paternal Grandmother        MI    Social History Social History   Tobacco Use  . Smoking status: Former Smoker    Packs/day: 1.00    Years: 50.00    Pack years: 50.00    Types: Cigarettes    Quit date: 10/03/2015    Years since quitting: 3.6  . Smokeless  tobacco: Never Used  . Tobacco comment: smokes 3 cigs daily 05/06/15. Pt instructed to quit.  Substance Use Topics  . Alcohol use: No    Alcohol/week: 0.0 standard drinks  . Drug use: No    Review of Systems  Constitutional: No fever/chills. Positive generalized weakness.  Eyes: No visual changes. ENT: No sore throat. Cardiovascular: Positive chest pain. Respiratory: Positive shortness of breath. Gastrointestinal: No abdominal pain.  No nausea, no vomiting.  No diarrhea.  No constipation. Genitourinary: Negative for dysuria. Musculoskeletal: Negative for back pain. Skin: Negative for rash. Neurological: Negative for headaches, focal weakness or numbness.  10-point ROS otherwise negative.  ____________________________________________   PHYSICAL EXAM:  VITAL SIGNS: ED Triage Vitals  Enc Vitals Group     BP 06/06/19 1742 (!) 151/65     Pulse Rate 06/06/19 1742 66     Resp 06/06/19 1742 18     Temp 06/06/19 1742 98.1 F (36.7 C)     Temp Source 06/06/19 1742 Oral     SpO2 06/06/19 1742 99 %  Weight 06/06/19 1745 136 lb (61.7 kg)     Height 06/06/19 1745 5\' 4"  (1.626 m)   Constitutional: Alert and oriented. Well appearing and in no acute distress. Eyes: Conjunctivae are normal.  Head: Atraumatic. Nose: No congestion/rhinnorhea. Mouth/Throat: Mucous membranes are moist.  Neck: No stridor.   Cardiovascular: Normal rate, regular rhythm. Good peripheral circulation. Grossly normal heart sounds.   Respiratory: Slight increased respiratory effort.  No retractions. Lungs with faint end-expiratory wheezing bilaterally. Good air entry.  Gastrointestinal: Soft and nontender. No distention.  Musculoskeletal: No lower extremity tenderness nor edema. No gross deformities of extremities. Neurologic:  Normal speech and language. No gross focal neurologic deficits are appreciated. Equal strength and sensation in the bilateral upper and lower extremities.  Skin:  Skin is warm, dry and  intact. No rash noted.   ____________________________________________   LABS (all labs ordered are listed, but only abnormal results are displayed)  Labs Reviewed  COMPREHENSIVE METABOLIC PANEL - Abnormal; Notable for the following components:      Result Value   Glucose, Bld 159 (*)    BUN 24 (*)    Creatinine, Ser 1.73 (*)    Calcium 8.6 (*)    Total Protein 6.3 (*)    Albumin 3.3 (*)    GFR calc non Af Amer 28 (*)    GFR calc Af Amer 32 (*)    All other components within normal limits  BRAIN NATRIURETIC PEPTIDE - Abnormal; Notable for the following components:   B Natriuretic Peptide 127.7 (*)    All other components within normal limits  CBC WITH DIFFERENTIAL/PLATELET - Abnormal; Notable for the following components:   RBC 3.21 (*)    Hemoglobin 9.8 (*)    HCT 31.6 (*)    RDW 16.0 (*)    Neutro Abs 9.1 (*)    Lymphs Abs 0.6 (*)    All other components within normal limits  D-DIMER, QUANTITATIVE (NOT AT [facility]) - Abnormal; Notable for the following components:   D-Dimer, Quant 1.42 (*)    All other components within normal limits  BASIC METABOLIC PANEL - Abnormal; Notable for the following components:   Glucose, Bld 254 (*)    Creatinine, Ser 1.36 (*)    Calcium 8.8 (*)    GFR calc non Af Amer 37 (*)    GFR calc Af Amer 43 (*)    All other components within normal limits  CBC - Abnormal; Notable for the following components:   WBC 10.9 (*)    RBC 3.36 (*)    Hemoglobin 10.3 (*)    HCT 32.8 (*)    RDW 15.8 (*)    All other components within normal limits  CBC - Abnormal; Notable for the following components:   WBC 11.5 (*)    RBC 3.42 (*)    Hemoglobin 10.4 (*)    HCT 33.5 (*)    RDW 16.0 (*)    All other components within normal limits  CREATININE, SERUM - Abnormal; Notable for the following components:   Creatinine, Ser 1.39 (*)    GFR calc non Af Amer 36 (*)    GFR calc Af Amer 42 (*)    All other components within normal limits  GLUCOSE, CAPILLARY -  Abnormal; Notable for the following components:   Glucose-Capillary 257 (*)    All other components within normal limits  GLUCOSE, CAPILLARY - Abnormal; Notable for the following components:   Glucose-Capillary 300 (*)    All other components within normal  limits  POCT I-STAT EG7 - Abnormal; Notable for the following components:   pH, Ven 7.443 (*)    pCO2, Ven 36.0 (*)    pO2, Ven 183.0 (*)    Calcium, Ion 0.95 (*)    HCT 22.0 (*)    Hemoglobin 7.5 (*)    All other components within normal limits  SARS CORONAVIRUS 2 (TAT 6-24 HRS)  LIPID PANEL  POC SARS CORONAVIRUS 2 AG -  ED  I-STAT VENOUS BLOOD GAS, ED  TROPONIN I (HIGH SENSITIVITY)  TROPONIN I (HIGH SENSITIVITY)  TROPONIN I (HIGH SENSITIVITY)  TROPONIN I (HIGH SENSITIVITY)   ____________________________________________  EKG   EKG Interpretation  Date/Time:  Tuesday June 06 2019 17:42:44 EST Ventricular Rate:  65 PR Interval:    QRS Duration: 91 QT Interval:  422 QTC Calculation: 439 R Axis:   33 Text Interpretation: Sinus rhythm Borderline short PR interval Abnormal R-wave progression, early transition No STEMI Confirmed by Nanda Quinton [phone]) on 06/06/2019 5:46:15 PM       ____________________________________________  RADIOLOGY  NM Pulmonary Perfusion  Result Date: 06/07/2019 CLINICAL DATA:  Shortness of breath, chest pain EXAM: NUCLEAR MEDICINE PERFUSION LUNG SCAN TECHNIQUE: Perfusion images were obtained in multiple projections after intravenous injection of radiopharmaceutical. Ventilation scans intentionally deferred if perfusion scan and chest x-ray adequate for interpretation during COVID 19 epidemic. RADIOPHARMACEUTICALS:  1.7 mCi Tc-69m MAA IV COMPARISON:  Chest x-ray 06/06/2019 FINDINGS: There are no perfusion defects noted. IMPRESSION: No evidence of perfusion defect to suggest pulmonary embolus. Electronically Signed   By: Rolm Baptise M.D.   On: 06/07/2019 11:32   DG Chest Portable 1 View  Result  Date: 06/06/2019 CLINICAL DATA:  Shortness of breath and chest pain EXAM: PORTABLE CHEST 1 VIEW COMPARISON:  09/22/2018 FINDINGS: Hyperinflation with emphysematous disease. No consolidation or effusion. Enlarged appearing central pulmonary arteries. No pneumothorax. Stable cardiomediastinal silhouette. IMPRESSION: No active disease. Electronically Signed   By: Donavan Foil M.D.   On: 06/06/2019 18:21    ____________________________________________   PROCEDURES  Procedure(s) performed:   Procedures  CRITICAL CARE Performed by: Margette Fast Total critical care time: 35 minutes Critical care time was exclusive of separately billable procedures and treating other patients. Critical care was necessary to treat or prevent imminent or life-threatening deterioration. Critical care was time spent personally by me on the following activities: development of treatment plan with patient and/or surrogate as well as nursing, discussions with consultants, evaluation of patient's response to treatment, examination of patient, obtaining history from patient or surrogate, ordering and performing treatments and interventions, ordering and review of laboratory studies, ordering and review of radiographic studies, pulse oximetry and re-evaluation of patient's condition.  Nanda Quinton, MD Emergency Medicine  ____________________________________________   INITIAL IMPRESSION / ASSESSMENT AND PLAN / ED COURSE  Pertinent labs & imaging results that were available during my care of the patient were reviewed by me and considered in my medical decision making (see chart for details).   Patient presents emergency department by EMS for evaluation of shortness of breath, generalized weakness over the past 3 days.  There is a report by patient of possible COVID-19 infection within the last 1 to 2 months.  I cannot find any documentation of a COVID-19 infection.  In the PCP note from today Covid is listed as a possible  differential.  The patient was vaccinated on 2/22 for COVID-19. Will re-test here. No congestion, fever, or sore throat.  I do not appreciate any focal neurologic deficits  to suspect stroke either.  I attempted to contact the husband by phone but could not get through it to get additional information.  Imaging and labs reviewed. Wheezing improved with albuterol and steroid. Suspect COPD exacerbation clinically but d-dimer is elevated. Cannot get CTA with CKD. Ordered VQ scan and will admit.   Discussed patient's case with [facility] to request admission. Patient and family (if present) updated with plan. Care transferred to [facility] service.  I reviewed all nursing notes, vitals, pertinent old records, EKGs, labs, imaging (as available).  ____________________________________________  FINAL CLINICAL IMPRESSION(S) / ED DIAGNOSES  Final diagnoses:  Acute respiratory failure with hypoxia ([place])     MEDICATIONS GIVEN DURING THIS VISIT:  Medications  aspirin EC tablet 81 mg (81 mg Oral Given 06/07/19 0955)  doxycycline (VIBRA-TABS) tablet 100 mg (100 mg Oral Given 06/07/19 0955)  amLODipine (NORVASC) tablet 5 mg (5 mg Oral Given 06/07/19 1146)  atorvastatin (LIPITOR) tablet 40 mg (has no administration in time range)  isosorbide mononitrate (IMDUR) 24 hr tablet 30 mg (30 mg Oral Given 06/07/19 0955)  metoprolol succinate (TOPROL-XL) 24 hr tablet 100 mg (100 mg Oral Given 06/07/19 0955)  nitroGLYCERIN (NITROSTAT) SL tablet 0.4 mg (has no administration in time range)  pantoprazole (PROTONIX) EC tablet 40 mg (40 mg Oral Given 06/07/19 0955)  clopidogrel (PLAVIX) tablet 75 mg (75 mg Oral Given 06/07/19 0955)  magnesium oxide (MAG-OX) tablet 400 mg (400 mg Oral Given 06/07/19 0955)  mometasone-formoterol (DULERA) 100-5 MCG/ACT inhaler 2 puff (2 puffs Inhalation Given 06/07/19 0748)  acetaminophen (TYLENOL) tablet 650 mg (has no administration in time range)    Or  acetaminophen (TYLENOL) suppository 650 mg (has no  administration in time range)  ondansetron (ZOFRAN) tablet 4 mg (has no administration in time range)    Or  ondansetron (ZOFRAN) injection 4 mg (has no administration in time range)  insulin aspart (novoLOG) injection 0-9 Units (5 Units Subcutaneous Given 06/07/19 1149)  methylPREDNISolone sodium succinate (SOLU-MEDROL) 40 mg/mL injection 40 mg (40 mg Intravenous Given 06/07/19 0639)  enoxaparin (LOVENOX) injection 30 mg (30 mg Subcutaneous Given 06/07/19 0022)  albuterol (PROVENTIL) (2.5 MG/3ML) 0.083% nebulizer solution 2.5 mg (has no administration in time range)  ipratropium (ATROVENT) nebulizer solution 0.5 mg (has no administration in time range)  albuterol (VENTOLIN HFA) 108 (90 Base) MCG/ACT inhaler 2 puff (2 puffs Inhalation Given 06/06/19 1958)  methylPREDNISolone sodium succinate (SOLU-MEDROL) 125 mg/2 mL injection 125 mg (125 mg Intravenous Given 06/06/19 1958)  technetium albumin aggregated (MAA) injection solution 1.7 millicurie (1.7 millicuries Intravenous Contrast Given 06/07/19 1048)    Note:  This document was prepared using Dragon voice recognition software and may include unintentional dictation errors.  Nanda Quinton, MD, [facility] Emergency Medicine    L[place]on Krakow, [facility], MD 06/07/19 [id number]

## 2019-06-06 NOTE — H&P (Signed)
History and Physical    [name] [name]:[id number] DOB: 06-26-1940 DOA: 06/06/2019  PCP: Jerrol Banana., MD  Patient coming from: Home.  Chief Complaint: Shortness of breath.  Chest pain.  HPI: [name] [name] is a 79 y.o. female with history of nonobstructive CAD, COPD, diabetes mellitus, hypertension, hyperlipidemia history of lung cancer presents to the ER with complaint of shortness of breath.  Patient states she has been having shortness of breath and wheezing for last 3 days and pleuritic type of chest pain.  Denies any productive cough fever or chills.  Patient had gone to her PCP and was prescribed prednisone and doxycycline.  Despite taking which patient was still short of breath.  ED Course: In the ER patient was wheezing chest x-ray was showing nothing acute.  EKG showed normal sinus rhythm.  High sensitive troponin was negative.  Labs show creatinine of 1.7 and hemoglobin 9.8.  BNP 127.7 D-dimer was 1.4.  Patient was given nebulizer treatment steroids and admitted for further management.  Since patient's D-dimer was elevated VQ scan has been ordered which is pending.  Review of Systems: As per HPI, rest all negative.   Past Medical History:  Diagnosis Date  . Abnormal CT lung screening 10/17/2015  . COPD (chronic obstructive pulmonary disease) ([place])   . Coronary artery disease, non-occlusive    a. cath 2006: min nonobs CAD; b. cath 12/2010: cath LAD 50%, RCA 60%; c. 08/2013: Minimal luminal irregs, right dominant system with no significant CAD, diffuse luminal irregs noted. Normal EF 55%, no AS or MS.   . Diabetes mellitus   . Hyperlipemia    Followed by Dr. Rosanna Randy  . Hypertension   . Lung cancer ([place])   . Macular degeneration    rt  . Personal history of tobacco use, presenting hazards to health 10/15/2015  . Pneumonia    hx  . Shortness of breath   . Stroke [facility])     Past Surgical History:  Procedure Laterality Date  . ABDOMINAL HYSTERECTOMY    . ANTERIOR  CERVICAL DECOMP/DISCECTOMY FUSION N/A 10/19/2013   Procedure: CERVICAL FIVE-SIX ANTERIOR CERVICAL DECOMPRESSION WITH FUSION INTERBODY PROSTHESIS PLATING AND PEEK CAGE;  Surgeon: Ophelia Charter, MD;  Location: [place] NEURO ORS;  Service: Neurosurgery;  Laterality: N/A;  . BACK SURGERY  80's  . BREAST CYST EXCISION Left    left negative   . CARDIAC CATHETERIZATION  05/2004  . CATARACT EXTRACTION Left   . CHOLECYSTECTOMY    . COLONOSCOPY N/A 12/29/2018   Procedure: COLONOSCOPY;  Surgeon: Toledo, Benay Pike, MD;  Location: [facility] ENDOSCOPY;  Service: Gastroenterology;  Laterality: N/A;  . ENDARTERECTOMY Left 10/24/2014   Procedure: ENDARTERECTOMY CAROTID;  Surgeon: Algernon Huxley, MD;  Location: [facility] ORS;  Service: Vascular;  Laterality: Left;  . ENDOBRONCHIAL ULTRASOUND N/A 11/14/2015   Procedure: ENDOBRONCHIAL ULTRASOUND;  Surgeon: [name], MD;  Location: [facility] ORS;  Service: Cardiopulmonary;  Laterality: N/A;  . PERIPHERAL VASCULAR CATHETERIZATION N/A 12/04/2015   Procedure: Glori Luis Cath Insertion;  Surgeon: Algernon Huxley, MD;  Location: [place] CV LAB;  Service: Cardiovascular;  Laterality: N/A;  . PORTA CATH REMOVAL N/A 05/17/2017   Procedure: PORTA CATH REMOVAL;  Surgeon: Algernon Huxley, MD;  Location: [place] CV LAB;  Service: Cardiovascular;  Laterality: N/A;  . TONSILLECTOMY AND ADENOIDECTOMY    . VESICOVAGINAL FISTULA CLOSURE W/ TAH       reports that she quit smoking about 3 years ago. Her smoking use included  cigarettes. She has a 50.00 pack-year smoking history. She has never used smokeless tobacco. She reports that she does not drink alcohol or use drugs.  Allergies  Allergen Reactions  . Coconut Fatty Acids Swelling and Other (See Comments)    Throat swells    Family History  Problem Relation Age of Onset  . Stroke Mother        Massive  . Heart attack Father        Massive  . Heart attack Brother   . Heart attack Brother   . Lung cancer Maternal Grandfather   . Heart  attack Paternal Grandmother        MI    Prior to Admission medications   Medication Sig Start Date End Date Taking? Authorizing Provider  albuterol (VENTOLIN HFA) 108 (90 Base) MCG/ACT inhaler Inhale 2 puffs into the lungs every 4 (four) hours as needed for wheezing or shortness of breath. 09/26/18   Jerrol Banana., MD  amLODipine (NORVASC) 5 MG tablet Take 5 mg by mouth daily.  09/27/18   [provider]  aspirin EC 81 MG EC tablet Take 1 tablet (81 mg total) by mouth daily. 05/08/18   Mayo, Pete Pelt, MD  atorvastatin (LIPITOR) 40 MG tablet Take 1 tablet (40 mg total) by mouth daily at 6 PM. 07/19/18   Jerrol Banana., MD  blood glucose meter kit and supplies Dispense based on patient and insurance preference. Use once daily as directed. (FOR ICD-10  E11.42). 06/02/19   Chrismon, Vickki Muff, PA  budesonide-formoterol (SYMBICORT) 80-4.5 MCG/ACT inhaler Inhale 2 puffs into the lungs 2 (two) times daily. Patient not taking: Reported on 05/15/2019 09/25/18   Fritzi Mandes, MD  clopidogrel (PLAVIX) 75 MG tablet TAKE 1 TABLET BY MOUTH EVERY DAY Patient taking differently: Take 75 mg by mouth daily.  08/30/18   Jerrol Banana., MD  doxycycline (VIBRA-TABS) 100 MG tablet Take 1 tablet (100 mg total) by mouth 2 (two) times daily for 7 days. 06/06/19 06/13/19  Trinna Post, PA-C  fluticasone (FLONASE) 50 MCG/ACT nasal spray Place 2 sprays into both nostrils daily. Patient not taking: Reported on 05/15/2019 07/21/17   Virginia Crews, MD  gabapentin (NEURONTIN) 600 MG tablet TAKE ONE-HALF TABLET BY  MOUTH TWO TIMES DAILY 05/04/19   Jerrol Banana., MD  glimepiride (AMARYL) 2 MG tablet TAKE 1 TABLET BY MOUTH TWO  TIMES DAILY 05/04/19   Jerrol Banana., MD  glucose blood [facility] ULTRA) test strip USE TO CHECK BLOOD SUGAR ONCE DAILY 02/15/19   Jerrol Banana., MD  ipratropium-albuterol (DUONEB) 0.5-2.5 (3) MG/3ML SOLN Take 3 mLs by nebulization every 4 (four) hours  as needed. 06/06/19   Trinna Post, PA-C  isosorbide mononitrate (IMDUR) 30 MG 24 hr tablet TAKE 1 TABLET BY MOUTH  DAILY 03/06/19   Jerrol Banana., MD  LORazepam (ATIVAN) 1 MG tablet Take 1 tablet (1 mg total) by mouth 2 (two) times daily as needed for anxiety. Patient not taking: Reported on 05/15/2019 05/24/17   Jerrol Banana., MD  magnesium oxide (MAG-OX) 400 (241.3 MG) MG tablet Take 400 mg by mouth 2 (two) times daily.  08/04/13   [provider]  metFORMIN (GLUCOPHAGE) 500 MG tablet Take 1 tablet (500 mg total) by mouth 2 (two) times daily with a meal. Patient taking differently: Take 500 mg by mouth 2 (two) times daily with a meal. Can only tolerate once daily  with meal 07/19/18   Jerrol Banana., MD  metoprolol succinate (TOPROL-XL) 100 MG 24 hr tablet Take 1 tablet (100 mg total) by mouth daily. 02/17/19   Jerrol Banana., MD  mometasone (ELOCON) 0.1 % lotion PLACE 4 DROPS INTO EACH EAR CANAL AT BEDTIME AS NEEDED FOR DRY SKIN/ITCHING/DISCOMFORT. 05/17/18   [provider]  Multiple Vitamin (MULTIVITAMIN) tablet Take 0.5 tablets by mouth 2 (two) times daily.    [provider]  Multiple Vitamins-Minerals (ICAPS) CAPS Take 1 capsule by mouth 2 (two) times daily.     [provider]  nitroGLYCERIN (NITROSTAT) 0.4 MG SL tablet Place 1 tablet (0.4 mg total) under the tongue every 5 (five) minutes as needed for chest pain. 02/15/19   Jerrol Banana., MD  nystatin (MYCOSTATIN/NYSTOP) powder Apply 1 application topically 3 (three) times daily. Patient not taking: Reported on 05/15/2019 03/29/19   Trinna Post, PA-C  pantoprazole (PROTONIX) 40 MG tablet TAKE 1 TABLET BY MOUTH TWICE A DAY 01/22/19   Jerrol Banana., MD  predniSONE (DELTASONE) 20 MG tablet Take 2 tablets (40 mg total) by mouth daily with breakfast for 5 days. 06/06/19 06/11/19  Trinna Post, PA-C  sucralfate (CARAFATE) 1 g tablet Take 1 tablet (1 g total)  by mouth 2 (two) times daily. 02/17/19   Jerrol Banana., MD  vitamin E (VITAMIN E) 400 UNIT capsule Take 400 Units by mouth daily.    [provider]    Physical Exam: Constitutional: Moderately built and nourished. Vitals:   06/06/19 1815 06/06/19 1845 06/06/19 1850 06/06/19 2000  BP: (!) 151/73 (!) 175/73  (!) 169/97  Pulse:   64 66  Resp: (!) '21 12 18 20  ' Temp:      TempSrc:      SpO2:   99% 94%  Weight:      Height:       Eyes: Anicteric no pallor. ENMT: No discharge from the ears eyes nose or mouth. Neck: No mass felt.  No neck rigidity. Respiratory: Mild expiratory wheeze and no crepitations. Cardiovascular: S1-S2 heard. Abdomen: Soft nontender bowel sounds present. Musculoskeletal: No edema.  No joint effusion. Skin: No rash. Neurologic: Alert awake oriented to time place and person.  Moves all extremities. Psychiatric: Appears normal per normal affect.   Labs on Admission: I have personally reviewed following labs and imaging studies  CBC: Recent Labs  Lab 06/06/19 1750 06/06/19 1907  WBC 10.5  --   NEUTROABS 9.1*  --   HGB 9.8* 7.5*  HCT 31.6* 22.0*  MCV 98.4  --   PLT 226  --    Basic Metabolic Panel: Recent Labs  Lab 06/06/19 1750 06/06/19 1907  NA 139 143  K 4.6 3.6  CL 103  --   CO2 25  --   GLUCOSE 159*  --   BUN 24*  --   CREATININE 1.73*  --   CALCIUM 8.6*  --    GFR: Estimated Creatinine Clearance: 23.5 mL/min (A) (by C-G formula based on SCr of 1.73 mg/dL (H)). Liver Function Tests: Recent Labs  Lab 06/06/19 1750  AST 15  ALT 19  ALKPHOS 61  BILITOT 0.5  PROT 6.3*  ALBUMIN 3.3*   No results for input(s): LIPASE, AMYLASE in the last 168 hours. No results for input(s): AMMONIA in the last 168 hours. Coagulation Profile: No results for input(s): INR, PROTIME in the last 168 hours. Cardiac Enzymes: No results for input(s):  CKTOTAL, CKMB, CKMBINDEX, TROPONINI in the last 168 hours. BNP (last 3 results) No  results for input(s): PROBNP in the last 8760 hours. HbA1C: No results for input(s): HGBA1C in the last 72 hours. CBG: No results for input(s): GLUCAP in the last 168 hours. Lipid Profile: No results for input(s): CHOL, HDL, LDLCALC, TRIG, CHOLHDL, LDLDIRECT in the last 72 hours. Thyroid Function Tests: No results for input(s): TSH, T4TOTAL, FREET4, T3FREE, THYROIDAB in the last 72 hours. Anemia Panel: No results for input(s): VITAMINB12, FOLATE, FERRITIN, TIBC, IRON, RETICCTPCT in the last 72 hours. Urine analysis:    Component Value Date/Time   COLORURINE Yellow 04/20/2019 0000   APPEARANCEUR Clear 04/20/2019 0000   LABSPEC 1.035 (H) 12/27/2018 2230   PHURINE 7.0 12/27/2018 2230   GLUCOSEU NEGATIVE 12/27/2018 2230   HGBUR LARGE (A) 12/27/2018 2230   BILIRUBINUR NEGATIVE 12/27/2018 2230   KETONESUR NEGATIVE 12/27/2018 2230   PROTEINUR 100 (A) 12/27/2018 2230   NITRITE NEGATIVE 12/27/2018 2230   LEUKOCYTESUR NEGATIVE 12/27/2018 2230   Sepsis Labs: '@LABRCNTIP' (procalcitonin:4,lacticidven:4) )No results found for this or any previous visit (from the past 240 hour(s)).   Radiological Exams on Admission: DG Chest Portable 1 View  Result Date: 06/06/2019 CLINICAL DATA:  Shortness of breath and chest pain EXAM: PORTABLE CHEST 1 VIEW COMPARISON:  09/22/2018 FINDINGS: Hyperinflation with emphysematous disease. No consolidation or effusion. Enlarged appearing central pulmonary arteries. No pneumothorax. Stable cardiomediastinal silhouette. IMPRESSION: No active disease. Electronically Signed   By: Donavan Foil M.D.   On: 06/06/2019 18:21    EKG: Independently reviewed.  Normal sinus rhythm.  Assessment/Plan Principal Problem:   COPD exacerbation (HCC) Active Problems:   HYPERTENSION, BENIGN   CHEST PAIN UNSPECIFIED   CAD (coronary artery disease)   Primary cancer of right upper lobe of lung (HCC)   COPD (chronic obstructive pulmonary disease) ([place])    1. COPD exacerbation for  which patient is on steroids nebulizer therapy doxycycline and Pulmicort.  Covid test is negative. 2. Chest pain appears pleuritic.  High-sensitivity troponins were negative.  Patient is on dual antiplatelet therapy and statins.  Since D-dimer is elevated VQ scan has been ordered. 3. Hypertension controlled presently on amlodipine beta-blockers Imdur and as needed IV hydralazine. 4. Chronic anemia likely from renal disease follow CBC. 5. Chronic kidney disease stage III-IV creatinine appears to be at baseline follow metabolic panel. 6. History of lung cancer.   DVT prophylaxis: Lovenox. Code Status: DNR. Family Communication: We will need to discuss with family. Disposition Plan: Home. Consults called: Cardiology. Admission status: Observation.   Rise Patience MD [facility] Pager [phone].  If 7PM-7AM, please contact night-coverage [email or web address] Password [facility]  06/06/2019, 10:55 PM

## 2019-06-06 NOTE — ED Triage Notes (Signed)
Pt to ED via EMS from home. Dx pneumonia via telemedicine a few days ago, cough x 2 days; prescribed antibiotics- but not taking. 86% on room air. Pt normally wears o2 at night 2L, o2 sats 96% on 2L- No medications given by EMS. #18 LFA. Per family pt had a period of confusion at home, but pt currently not confused. Family also reports to EMS increased in weakness over the past week. Pt C/O right leg weakness for weeks.  Last VS: hr 70, bp 173/80, cbg 200;  hx MI, Stroke, HTN, -

## 2019-06-06 NOTE — Progress Notes (Signed)
Patient: [name] [name] Female    DOB: [birth year]   79 y.o.   MRN: [id number] Visit Date: 06/06/2019  Today's Provider: Trinna Post, PA-C   Chief Complaint  Patient presents with  . Cough   Subjective:    Virtual Visit via Telephone Note  I connected with [name] [name] on 06/06/19 at  3:00 PM EST by telephone and verified that I am speaking with the correct person using two identifiers.  Location: Patient: Home Provider: Office   I discussed the limitations, risks, security and privacy concerns of performing an evaluation and management service by telephone and the availability of in person appointments. I also discussed with the patient that there may be a patient responsible charge related to this service. The patient expressed understanding and agreed to proceed.  Cough This is a recurrent problem. The current episode started 1 to 4 weeks ago. The problem has been gradually worsening. The cough is non-productive. Associated symptoms include chest pain, chills, rhinorrhea, a sore throat, shortness of breath and wheezing. Pertinent negatives include no fever or headaches. The symptoms are aggravated by other (Walking per pt). She has tried a beta-agonist inhaler and OTC cough suppressant for the symptoms. The treatment provided mild relief.   Patient with history of COPD, lung cancer, CAD and HTN on 3L of oxygen at night to sleep. Denies temperature. Reports some shortness of breath and wheezing. She fell 10 x over the past few weeks. Husband reports she appears more confused and is coughing and having some shortness of breath. Husband reports her BP is 169/82. Husband reports symptoms improved after albuterol nebulizer. O2 sats are ranging from 84% to 86% at rest when her normal resting sats are 94% without oxygen.      Allergies  Allergen Reactions  . Coconut Fatty Acids Swelling and Other (See Comments)    Throat swells     Current Outpatient Medications:  .   albuterol (VENTOLIN HFA) 108 (90 Base) MCG/ACT inhaler, Inhale 2 puffs into the lungs every 4 (four) hours as needed for wheezing or shortness of breath., Disp: 8 g, Rfl: 11 .  amLODipine (NORVASC) 5 MG tablet, Take 5 mg by mouth daily. , Disp: , Rfl:  .  aspirin EC 81 MG EC tablet, Take 1 tablet (81 mg total) by mouth daily., Disp: 30 tablet, Rfl: 0 .  atorvastatin (LIPITOR) 40 MG tablet, Take 1 tablet (40 mg total) by mouth daily at 6 PM., Disp: 90 tablet, Rfl: 3 .  blood glucose meter kit and supplies, Dispense based on patient and insurance preference. Use once daily as directed. (FOR ICD-10  E11.42)., Disp: 1 each, Rfl: 0 .  clopidogrel (PLAVIX) 75 MG tablet, TAKE 1 TABLET BY MOUTH EVERY DAY (Patient taking differently: Take 75 mg by mouth daily. ), Disp: 90 tablet, Rfl: 1 .  gabapentin (NEURONTIN) 600 MG tablet, TAKE ONE-HALF TABLET BY  MOUTH TWO TIMES DAILY, Disp: 90 tablet, Rfl: 3 .  glimepiride (AMARYL) 2 MG tablet, TAKE 1 TABLET BY MOUTH TWO  TIMES DAILY, Disp: 180 tablet, Rfl: 3 .  glucose blood (ONETOUCH ULTRA) test strip, USE TO CHECK BLOOD SUGAR ONCE DAILY, Disp: 100 strip, Rfl: 3 .  isosorbide mononitrate (IMDUR) 30 MG 24 hr tablet, TAKE 1 TABLET BY MOUTH  DAILY, Disp: 90 tablet, Rfl: 1 .  magnesium oxide (MAG-OX) 400 (241.3 MG) MG tablet, Take 400 mg by mouth 2 (two) times daily. , Disp: , Rfl:  .  metFORMIN (GLUCOPHAGE) 500 MG tablet, Take 1 tablet (500 mg total) by mouth 2 (two) times daily with a meal. (Patient taking differently: Take 500 mg by mouth 2 (two) times daily with a meal. Can only tolerate once daily with meal), Disp: 180 tablet, Rfl: 3 .  metoprolol succinate (TOPROL-XL) 100 MG 24 hr tablet, Take 1 tablet (100 mg total) by mouth daily., Disp: 90 tablet, Rfl: 3 .  mometasone (ELOCON) 0.1 % lotion, PLACE 4 DROPS INTO EACH EAR CANAL AT BEDTIME AS NEEDED FOR DRY SKIN/ITCHING/DISCOMFORT., Disp: , Rfl:  .  Multiple Vitamin (MULTIVITAMIN) tablet, Take 0.5 tablets by mouth 2  (two) times daily., Disp: , Rfl:  .  Multiple Vitamins-Minerals (ICAPS) CAPS, Take 1 capsule by mouth 2 (two) times daily. , Disp: , Rfl:  .  nitroGLYCERIN (NITROSTAT) 0.4 MG SL tablet, Place 1 tablet (0.4 mg total) under the tongue every 5 (five) minutes as needed for chest pain., Disp: 25 tablet, Rfl: 3 .  pantoprazole (PROTONIX) 40 MG tablet, TAKE 1 TABLET BY MOUTH TWICE A DAY, Disp: 180 tablet, Rfl: 1 .  sucralfate (CARAFATE) 1 g tablet, Take 1 tablet (1 g total) by mouth 2 (two) times daily., Disp: 180 tablet, Rfl: 3 .  vitamin E (VITAMIN E) 400 UNIT capsule, Take 400 Units by mouth daily., Disp: , Rfl:  .  budesonide-formoterol (SYMBICORT) 80-4.5 MCG/ACT inhaler, Inhale 2 puffs into the lungs 2 (two) times daily. (Patient not taking: Reported on 05/15/2019), Disp: 1 Inhaler, Rfl: 2 .  fluticasone (FLONASE) 50 MCG/ACT nasal spray, Place 2 sprays into both nostrils daily. (Patient not taking: Reported on 05/15/2019), Disp: 16 g, Rfl: 6 .  LORazepam (ATIVAN) 1 MG tablet, Take 1 tablet (1 mg total) by mouth 2 (two) times daily as needed for anxiety. (Patient not taking: Reported on 05/15/2019), Disp: 30 tablet, Rfl: 5 .  nystatin (MYCOSTATIN/NYSTOP) powder, Apply 1 application topically 3 (three) times daily. (Patient not taking: Reported on 05/15/2019), Disp: 15 g, Rfl: 0 No current facility-administered medications for this visit.  Facility-Administered Medications Ordered in Other Visits:  .  ondansetron (ZOFRAN) 8 mg, dexamethasone (DECADRON) 10 mg in sodium chloride 0.9 % 50 mL IVPB, , Intravenous, Once, Finnegan, [name] November, MD  Review of Systems  Constitutional: Positive for chills. Negative for fatigue and fever.  HENT: Positive for congestion, rhinorrhea, sneezing and sore throat. Negative for sinus pressure and sinus pain.   Respiratory: Positive for cough, chest tightness, shortness of breath and wheezing.   Cardiovascular: Positive for chest pain.  Neurological: Negative for headaches.     Social History   Tobacco Use  . Smoking status: Former Smoker    Packs/day: 1.00    Years: 50.00    Pack years: 50.00    Types: Cigarettes    Quit date: 10/03/2015    Years since quitting: 3.6  . Smokeless tobacco: Never Used  . Tobacco comment: smokes 3 cigs daily 05/06/15. Pt instructed to quit.  Substance Use Topics  . Alcohol use: No    Alcohol/week: 0.0 standard drinks      Objective:   There were no vitals taken for this visit. There were no vitals filed for this visit.There is no height or weight on file to calculate BMI.   Physical Exam   No results found for any visits on 06/06/19.     Assessment & Plan    1. Cough  Patient is a chronically ill woman with cancer, COPD on 3L oxygen at night presenting with  cough, confusion, and SOB worsening over the past several days. Her O2 sats are in the mid 80's.  I have advised the patient and her husband to [place] ER as she appears to be in respiratory distress and may be suffering from any number of life threatening conditions including COVID, bronchitis, pneumonia, PE, MI, etc. Patient has adamantly refused. I have advised patient and husband that delaying care can lead to incomplete assessment, treatment, and death. Patient and husband express understanding. Patient reports that she does not want to go to the ER.  As patient is going [place], I will offer the following work up below though I have expressed my reservations to the patient and her husband. I feel this assessment and treatment is suboptimal and incomplete. Patient and husband have been strictly advised that if she is worsening they need to [place] ER.  - DG Chest 2 View; Future - doxycycline (VIBRA-TABS) 100 MG tablet; Take 1 tablet (100 mg total) by mouth 2 (two) times daily for 7 days.  Dispense: 14 tablet; Refill: 0 - predniSONE (DELTASONE) 20 MG tablet; Take 2 tablets (40 mg total) by mouth daily with breakfast for 5 days.  Dispense:  10 tablet; Refill: 0 - ipratropium-albuterol (DUONEB) 0.5-2.5 (3) MG/3ML SOLN; Take 3 mLs by nebulization every 4 (four) hours as needed.  Dispense: 360 mL; Refill: 1  2. Respiratory distress  - DG Chest 2 View; Future - doxycycline (VIBRA-TABS) 100 MG tablet; Take 1 tablet (100 mg total) by mouth 2 (two) times daily for 7 days.  Dispense: 14 tablet; Refill: 0 - predniSONE (DELTASONE) 20 MG tablet; Take 2 tablets (40 mg total) by mouth daily with breakfast for 5 days.  Dispense: 10 tablet; Refill: 0 - ipratropium-albuterol (DUONEB) 0.5-2.5 (3) MG/3ML SOLN; Take 3 mLs by nebulization every 4 (four) hours as needed.  Dispense: 360 mL; Refill: 1 I discussed the assessment and treatment plan with the patient. The patient was provided an opportunity to ask questions and all were answered. The patient agreed with the plan and demonstrated an understanding of the instructions.   The patient was advised to call back or seek an in-person evaluation if the symptoms worsen or if the condition fails to improve as anticipated.  I provided 25 minutes of non-face-to-face time during this encounter.  The entirety of the information documented in the History of Present Illness, Review of Systems and Physical Exam were personally obtained by me. Portions of this information were initially documented by [facility] and reviewed by me for thoroughness and accuracy.      [name] Post, PA-C  [facility]

## 2019-06-06 NOTE — ED Notes (Signed)
Pt care endorsed Phil RN.

## 2019-06-06 NOTE — ED Notes (Signed)
Pt resting in bed. Pt denies new or worsening complaints. Will continue to monitor. No distress noted. Pt on continuous monitoring via blood pressure, pulse ox, and cardiac monitor.  

## 2019-06-06 NOTE — ED Notes (Signed)
Pt care endorsed from [facility]. Medications given and charted per [facility]. Tolerated well. No distress noted.

## 2019-06-07 ENCOUNTER — Telehealth: Payer: Self-pay | Admitting: Cardiovascular Disease

## 2019-06-07 ENCOUNTER — Observation Stay (HOSPITAL_COMMUNITY): Payer: Medicare Other

## 2019-06-07 ENCOUNTER — Encounter (HOSPITAL_COMMUNITY): Payer: Self-pay | Admitting: Internal Medicine

## 2019-06-07 DIAGNOSIS — R079 Chest pain, unspecified: Secondary | ICD-10-CM | POA: Diagnosis not present

## 2019-06-07 DIAGNOSIS — R279 Unspecified lack of coordination: Secondary | ICD-10-CM | POA: Diagnosis not present

## 2019-06-07 DIAGNOSIS — I10 Essential (primary) hypertension: Secondary | ICD-10-CM

## 2019-06-07 DIAGNOSIS — R0602 Shortness of breath: Secondary | ICD-10-CM | POA: Diagnosis not present

## 2019-06-07 DIAGNOSIS — Z743 Need for continuous supervision: Secondary | ICD-10-CM | POA: Diagnosis not present

## 2019-06-07 DIAGNOSIS — J441 Chronic obstructive pulmonary disease with (acute) exacerbation: Secondary | ICD-10-CM | POA: Diagnosis not present

## 2019-06-07 LAB — LIPID PANEL
Cholesterol: 149 mg/dL (ref 0–200)
HDL: 57 mg/dL (ref >40)
LDL Cholesterol: 84 mg/dL (ref 0–99)
Total CHOL/HDL Ratio: 2.6 RATIO
Triglycerides: 41 mg/dL (ref <150)
VLDL: 8 mg/dL (ref 0–40)

## 2019-06-07 LAB — CBC
HCT: 32.8 % — ABNORMAL LOW (ref 36.0–46.0)
Hemoglobin: 10.3 g/dL — ABNORMAL LOW (ref 12.0–15.0)
MCH: 30.7 pg (ref 26.0–34.0)
MCHC: 31.4 g/dL (ref 30.0–36.0)
MCV: 97.6 fL (ref 80.0–100.0)
Platelets: 229 10*3/uL (ref 150–400)
RBC: 3.36 MIL/uL — ABNORMAL LOW (ref 3.87–5.11)
RDW: 15.8 % — ABNORMAL HIGH (ref 11.5–15.5)
WBC: 10.9 10*3/uL — ABNORMAL HIGH (ref 4.0–10.5)
nRBC: 0 % (ref 0.0–0.2)

## 2019-06-07 LAB — SARS CORONAVIRUS 2 (TAT 6-24 HRS): SARS Coronavirus 2: NEGATIVE

## 2019-06-07 LAB — BASIC METABOLIC PANEL
Anion gap: 14 (ref 5–15)
BUN: 23 mg/dL (ref 8–23)
CO2: 25 mmol/L (ref 22–32)
Calcium: 8.8 mg/dL — ABNORMAL LOW (ref 8.9–10.3)
Chloride: 99 mmol/L (ref 98–111)
Creatinine, Ser: 1.36 mg/dL — ABNORMAL HIGH (ref 0.44–1.00)
GFR calc Af Amer: 43 mL/min — ABNORMAL LOW (ref >60)
GFR calc non Af Amer: 37 mL/min — ABNORMAL LOW (ref >60)
Glucose, Bld: 254 mg/dL — ABNORMAL HIGH (ref 70–99)
Potassium: 4.3 mmol/L (ref 3.5–5.1)
Sodium: 138 mmol/L (ref 135–145)

## 2019-06-07 LAB — TROPONIN I (HIGH SENSITIVITY): Troponin I (High Sensitivity): 9 ng/L (ref <18)

## 2019-06-07 LAB — GLUCOSE, CAPILLARY
Glucose-Capillary: 257 mg/dL — ABNORMAL HIGH (ref 70–99)
Glucose-Capillary: 300 mg/dL — ABNORMAL HIGH (ref 70–99)
Glucose-Capillary: 332 mg/dL — ABNORMAL HIGH (ref 70–99)

## 2019-06-07 MED ORDER — ISOSORBIDE MONONITRATE ER 60 MG PO TB24: 60.0000 mg | ORAL_TABLET | Freq: Every day | ORAL | 0 refills | Status: DC

## 2019-06-07 MED ORDER — IPRATROPIUM BROMIDE 0.02 % IN SOLN: 0.5000 mg | Freq: Two times a day (BID) | RESPIRATORY_TRACT | Status: DC

## 2019-06-07 MED ORDER — TECHNETIUM TO 99M ALBUMIN AGGREGATED
1.7000 | Freq: Once | INTRAVENOUS | Status: AC | PRN
  Administered 2019-06-07: 1.7 via INTRAVENOUS

## 2019-06-07 MED ORDER — ALBUTEROL SULFATE (2.5 MG/3ML) 0.083% IN NEBU: 2.5000 mg | INHALATION_SOLUTION | Freq: Two times a day (BID) | RESPIRATORY_TRACT | Status: DC

## 2019-06-07 MED ORDER — ISOSORBIDE MONONITRATE ER 30 MG PO TB24
30.0000 mg | ORAL_TABLET | Freq: Every day | ORAL | Status: DC
  Administered 2019-06-07: 30 mg via ORAL

## 2019-06-07 MED ORDER — ISOSORBIDE MONONITRATE ER 60 MG PO TB24
60.0000 mg | ORAL_TABLET | Freq: Every day | ORAL | Status: DC
  Filled 2019-06-07: qty 1

## 2019-06-07 MED ORDER — IPRATROPIUM BROMIDE 0.02 % IN SOLN
0.5000 mg | RESPIRATORY_TRACT | Status: DC
  Administered 2019-06-07: 0.5 mg via RESPIRATORY_TRACT
  Filled 2019-06-07: qty 2.5

## 2019-06-07 MED ORDER — PREDNISONE 50 MG PO TABS: 50.0000 mg | ORAL_TABLET | Freq: Every day | ORAL | 0 refills | Status: AC

## 2019-06-07 NOTE — Telephone Encounter (Signed)
-----   Message from Theora Gianotti, NP sent at 06/07/2019  2:38 PM EST ----- Would one of you guys pls schedule [name] for a virtual visit w/ [facility] in 10 days?  Thanks,  Gerald Stabs ----- Message ----- From: Minus Breeding, MD Sent: 06/07/2019   2:27 PM EST To: Theora Gianotti, NP, #  See my consult note.  Can somebody schedule a virtual visit for about 10 days from now and check on BP and chest pain.

## 2019-06-07 NOTE — Discharge Instructions (Signed)
Chronic Obstructive Pulmonary Disease Chronic obstructive pulmonary disease (COPD) is a long-term (chronic) lung problem. When you have COPD, it is hard for air to get in and out of your lungs. Usually the condition gets worse over time, and your lungs will never return to normal. There are things you can do to keep yourself as healthy as possible.  Your doctor may treat your condition with: ? Medicines. ? Oxygen. ? Lung surgery.  Your doctor may also recommend: ? Rehabilitation. This includes steps to make your body work better. It may involve a team of specialists. ? Quitting smoking, if you smoke. ? Exercise and changes to your diet. ? Comfort measures (palliative care). Follow these instructions at home: Medicines  Take over-the-counter and prescription medicines only as told by your doctor.  Talk to your doctor before taking any cough or allergy medicines. You may need to avoid medicines that cause your lungs to be dry. Lifestyle  If you smoke, stop. Smoking makes the problem worse. If you need help quitting, ask your doctor.  Avoid being around things that make your breathing worse. This may include smoke, chemicals, and fumes.  Stay active, but remember to rest as well.  Learn and use tips on how to relax.  Make sure you get enough sleep. Most adults need at least 7 hours of sleep every night.  Eat healthy foods. Eat smaller meals more often. Rest before meals. Controlled breathing Learn and use tips on how to control your breathing as told by your doctor. Try:  Breathing in (inhaling) through your nose for 1 second. Then, pucker your lips and breath out (exhale) through your lips for 2 seconds.  Putting one hand on your belly (abdomen). Breathe in slowly through your nose for 1 second. Your hand on your belly should move out. Pucker your lips and breathe out slowly through your lips. Your hand on your belly should move in as you breathe out.  Controlled coughing Learn  and use controlled coughing to clear mucus from your lungs. Follow these steps: 1. Lean your head a little forward. 2. Breathe in deeply. 3. Try to hold your breath for 3 seconds. 4. Keep your mouth slightly open while coughing 2 times. 5. Spit any mucus out into a tissue. 6. Rest and do the steps again 1 or 2 times as needed. General instructions  Make sure you get all the shots (vaccines) that your doctor recommends. Ask your doctor about a flu shot and a pneumonia shot.  Use oxygen therapy and pulmonary rehabilitation if told by your doctor. If you need home oxygen therapy, ask your doctor if you should buy a tool to measure your oxygen level (oximeter).  Make a COPD action plan with your doctor. This helps you to know what to do if you feel worse than usual.  Manage any other conditions you have as told by your doctor.  Avoid going outside when it is very hot, cold, or humid.  Avoid people who have a sickness you can catch (contagious).  Keep all follow-up visits as told by your doctor. This is important. Contact a doctor if:  You cough up more mucus than usual.  There is a change in the color or thickness of the mucus.  It is harder to breathe than usual.  Your breathing is faster than usual.  You have trouble sleeping.  You need to use your medicines more often than usual.  You have trouble doing your normal activities such as getting dressed   or walking around the house. Get help right away if:  You have shortness of breath while resting.  You have shortness of breath that stops you from: ? Being able to talk. ? Doing normal activities.  Your chest hurts for longer than 5 minutes.  Your skin color is more blue than usual.  Your pulse oximeter shows that you have low oxygen for longer than 5 minutes.  You have a fever.  You feel too tired to breathe normally. Summary  Chronic obstructive pulmonary disease (COPD) is a long-term lung problem.  The way your  lungs work will never return to normal. Usually the condition gets worse over time. There are things you can do to keep yourself as healthy as possible.  Take over-the-counter and prescription medicines only as told by your doctor.  If you smoke, stop. Smoking makes the problem worse. This information is not intended to replace advice given to you by your health care provider. Make sure you discuss any questions you have with your health care provider. Document Revised: 03/05/2017 Document Reviewed: 04/27/2016 Elsevier Patient Education  2020 Elsevier Inc.  

## 2019-06-07 NOTE — Progress Notes (Signed)
Inpatient Diabetes Program Recommendations  AACE/ADA: New Consensus Statement on Inpatient Glycemic Control (2015)  Target Ranges:  Prepandial:   less than 140 mg/dL      Peak postprandial:   less than 180 mg/dL (1-2 hours)      Critically ill patients:  140 - 180 mg/dL   Lab Results  Component Value Date   GLUCAP 300 (H) 06/07/2019   HGBA1C 5.9 (H) 01/10/2019    Review of Glycemic Control  Results for [name] [name], [name] [name] (MRN [id number]) as of 06/07/2019 11:34  Ref. Range 06/07/2019 05:42 06/07/2019 11:26  Glucose-Capillary Latest Ref Range: 70 - 99 mg/dL 257 (H)  Novolog 5 units + Solumedrol 40 mg 300 (H)    Diabetes history: DM2 Outpatient Diabetes medications: Amaryl 2 mg BID Current orders for Inpatient glycemic control: Novolog 0-9 units TID + Solumedrol 40 mg Q12Hr  Inpatient Diabetes Program Recommendations:      Noted that patient received 125 mg solumedrol at [facility] last evening and now receiving 40 units BID.  If steroids continue, please consider  -Lantus 12 units (61.7kg/0.2units) -Novolog 3 units TID with meals if eats at least 50%  Thank you, Reche Dixon, RN, BSN Diabetes Coordinator Inpatient Diabetes Program [phone] (team pager from 8a-5p)

## 2019-06-07 NOTE — Telephone Encounter (Signed)
TCM....  Patient is being discharged   They were seen at [facility]  They are scheduled to see [facility] 3/17 at [address]    They need to be seen within 10 days   Please call

## 2019-06-07 NOTE — Discharge Summary (Signed)
Physician Discharge Summary  ASUCENA GALER HUD:[id number] DOB: 01-Sep-1940 DOA: 06/06/2019  PCP: Jerrol Banana., MD  Admit date: 06/06/2019 Discharge date: 06/07/2019  Admitted From: Home Disposition: Home  Recommendations for Outpatient Follow-up:  1. Follow up with PCP in 1-2 weeks 2. Follow-up with regular pulmonologist within 1 to 2 weeks 3. Please obtain BMP/CBC in one week 4. Please follow up on the following pending results:  Home Health: None Equipment/Devices: None  Discharge Condition: Stable CODE STATUS: DNR Diet recommendation: Cardiac  Subjective: Seen and examined.  Feels much better.  Breathing back to baseline.  Oxygen requirement of 2 to 3 L nasal cannula is also at baseline.  HPI: [name] UNGAR is a 79 y.o. female with history of nonobstructive CAD, COPD, diabetes mellitus, hypertension, hyperlipidemia history of lung cancer presents to the ER with complaint of shortness of breath.  Patient states she has been having shortness of breath and wheezing for last 3 days and pleuritic type of chest pain.  Denies any productive cough fever or chills.  Patient had gone to her PCP and was prescribed prednisone and doxycycline.  Despite taking which patient was still short of breath.  ED Course: In the ER patient was wheezing chest x-ray was showing nothing acute.  EKG showed normal sinus rhythm.  High sensitive troponin was negative.  Labs show creatinine of 1.7 and hemoglobin 9.8.  BNP 127.7 D-dimer was 1.4.  Patient was given nebulizer treatment steroids and admitted for further management.  Since patient's D-dimer was elevated VQ scan has been ordered which is pending.  Brief/Interim Summary: Patient was admitted under hospitalist service with acute exacerbation of COPD however chronic respiratory failure since she did not require more than her usual oxygen which is 2 to 3 L.  She was started on Solu-Medrol, duo nebs and antibiotics.  Patient also had chest pain.  Cardiac  enzymes were obtained which were negative.  Cardiology was consulted.  They did not recommend further work-up for chest pain as that seems to be likely due to her COPD exacerbation.  Due to elevated D-dimer, VQ scan was obtained which was low risk/low probability study.  She has been cleared by cardiology and she is feeling better and her breathing is back to her baseline and oxygen requirement remains same so she is going to be discharged home.  Despite of resuming her home medications, her blood pressure remained elevated so cardiology recommended increasing her Imdur from 30 mg to 60 mg.  We will do that.  Also discharging her on 4 more days of oral prednisone to complete 5-day course for acute COPD exacerbation.  Discharge Diagnoses:  Principal Problem:   COPD exacerbation ([place]) Active Problems:   HYPERTENSION, BENIGN   CHEST PAIN UNSPECIFIED   CAD (coronary artery disease)   Primary cancer of right upper lobe of lung (HCC)   COPD (chronic obstructive pulmonary disease) [facility])    Discharge Instructions  Discharge Instructions    Discharge patient   Complete by: As directed    Discharge disposition: 01-Home or Self Care   Discharge patient date: 06/07/2019     Allergies as of 06/07/2019      Reactions   Coconut Fatty Acids Swelling, Other (See Comments)   Throat swells      Medication List    TAKE these medications   albuterol 108 (90 Base) MCG/ACT inhaler Commonly known as: VENTOLIN HFA Inhale 2 puffs into the lungs every 4 (four) hours as needed for wheezing or  shortness of breath.   amLODipine 5 MG tablet Commonly known as: NORVASC Take 5 mg by mouth daily.   aspirin 81 MG EC tablet Take 1 tablet (81 mg total) by mouth daily.   atorvastatin 40 MG tablet Commonly known as: LIPITOR Take 1 tablet (40 mg total) by mouth daily at 6 PM.   blood glucose meter kit and supplies Dispense based on patient and insurance preference. Use once daily as directed. (FOR ICD-10   E11.42).   clopidogrel 75 MG tablet Commonly known as: PLAVIX TAKE 1 TABLET BY MOUTH EVERY DAY   doxycycline 100 MG tablet Commonly known as: VIBRA-TABS Take 1 tablet (100 mg total) by mouth 2 (two) times daily for 7 days.   fluticasone 50 MCG/ACT nasal spray Commonly known as: FLONASE Place 2 sprays into both nostrils daily. What changed:   when to take this  reasons to take this   gabapentin 600 MG tablet Commonly known as: [place] What changed: See the new instructions.   glimepiride 2 MG tablet Commonly known as: AMARYL TAKE 1 TABLET BY MOUTH TWO  TIMES DAILY   ICaps Caps Take 1 capsule by mouth daily.   ipratropium-albuterol 0.5-2.5 (3) MG/3ML Soln Commonly known as: DUONEB Take 3 mLs by nebulization every 4 (four) hours as needed.   isosorbide mononitrate 60 MG 24 hr tablet Commonly known as: IMDUR Take 1 tablet (60 mg total) by mouth daily. What changed:   medication strength  how much to take   LORazepam 1 MG tablet Commonly known as: ATIVAN Take 1 tablet (1 mg total) by mouth 2 (two) times daily as needed for anxiety.   magnesium oxide 400 (241.3 Mg) MG tablet Commonly known as: MAG-OX Take 400 mg by mouth 2 (two) times daily.   metFORMIN 500 MG tablet Commonly known as: GLUCOPHAGE Take 1 tablet (500 mg total) by mouth 2 (two) times daily with a meal. What changed:   when to take this  additional instructions   metoprolol succinate 100 MG 24 hr tablet Commonly known as: TOPROL-XL Take 1 tablet (100 mg total) by mouth daily.   mometasone 0.1 % lotion Commonly known as: ELOCON Apply 4 drops topically at bedtime as needed (dry skin).   multivitamin tablet Take 0.5 tablets by mouth 2 (two) times daily.   nitroGLYCERIN 0.4 MG SL tablet Commonly known as: NITROSTAT Place 1 tablet (0.4 mg total) under the tongue every 5 (five) minutes as needed for chest pain.   OneTouch Ultra test  strip Generic drug: glucose blood USE TO CHECK BLOOD SUGAR ONCE DAILY   pantoprazole 40 MG tablet Commonly known as: PROTONIX TAKE 1 TABLET BY MOUTH TWICE A DAY   predniSONE 20 MG tablet Commonly known as: DELTASONE Take 2 tablets (40 mg total) by mouth daily with breakfast for 5 days.   sucralfate 1 g tablet Commonly known as: CARAFATE Take 1 tablet (1 g total) by mouth 2 (two) times daily.   vitamin E 180 MG (400 UNITS) capsule Generic drug: vitamin E Take 400 Units by mouth daily.      Follow-up Information    Jerrol Banana., MD Follow up in 1 week(s).   Specialty: Family Medicine Contact information: [address] [place] [address] [phone]        [name], MD .   Specialty: Cardiology Contact information: [address] [place] [address] [phone]  Allergies  Allergen Reactions  . Coconut Fatty Acids Swelling and Other (See Comments)    Throat swells    Consultations: Cardiology   Procedures/Studies: NM Pulmonary Perfusion  Result Date: 06/07/2019 CLINICAL DATA:  Shortness of breath, chest pain EXAM: NUCLEAR MEDICINE PERFUSION LUNG SCAN TECHNIQUE: Perfusion images were obtained in multiple projections after intravenous injection of radiopharmaceutical. Ventilation scans intentionally deferred if perfusion scan and chest x-ray adequate for interpretation during COVID 19 epidemic. RADIOPHARMACEUTICALS:  1.7 mCi Tc-55mMAA IV COMPARISON:  Chest x-ray 06/06/2019 FINDINGS: There are no perfusion defects noted. IMPRESSION: No evidence of perfusion defect to suggest pulmonary embolus. Electronically Signed   By: KRolm BaptiseM.D.   On: 06/07/2019 11:32   DG Chest Portable 1 View  Result Date: 06/06/2019 CLINICAL DATA:  Shortness of breath and chest pain EXAM: PORTABLE CHEST 1 VIEW COMPARISON:  09/22/2018 FINDINGS: Hyperinflation with emphysematous disease. No consolidation or effusion. Enlarged  appearing central pulmonary arteries. No pneumothorax. Stable cardiomediastinal silhouette. IMPRESSION: No active disease. Electronically Signed   By: KDonavan FoilM.D.   On: 06/06/2019 18:21      Discharge Exam: Vitals:   06/07/19 0747 06/07/19 1257  BP:  (!) 150/45  Pulse: 69 65  Resp: 19 20  Temp:  97.8 F (36.6 C)  SpO2: 95% 97%   Vitals:   06/07/19 0140 06/07/19 0539 06/07/19 0747 06/07/19 1257  BP: (!) 169/87 (!) 159/66  (!) 150/45  Pulse: 69 67 69 65  Resp: _0 Temp: 98.1 F (36.7 C) (!) 97.4 F (36.3 C)  97.8 F (36.6 C)  TempSrc: Oral Oral  Oral  SpO2:  97% 95% 97%  Weight:      Height:        General: Pt is alert, awake, not in acute distress Cardiovascular: RRR, S1/S2 +, no rubs, no gallops Respiratory: CTA bilaterally, no wheezing, no rhonchi Abdominal: Soft, NT, ND, bowel sounds + Extremities: no edema, no cyanosis    The results of significant diagnostics from this hospitalization (including imaging, microbiology, ancillary and laboratory) are listed below for reference.     Microbiology: Recent Results (from the past 240 hour(s))  SARS CORONAVIRUS 2 (TAT 6-24 HRS) Nasopharyngeal Nasopharyngeal Swab     Status: None   Collection Time: 06/06/19  8:15 PM   Specimen: Nasopharyngeal Swab  Result Value Ref Range Status   SARS Coronavirus 2 NEGATIVE NEGATIVE Final    Comment: (NOTE) SARS-CoV-2 target nucleic acids are NOT DETECTED. The SARS-CoV-2 RNA is generally detectable in upper and lower respiratory specimens during the acute phase of infection. Negative results do not preclude SARS-CoV-2 infection, do not rule out co-infections with other pathogens, and should not be used as the sole basis for treatment or other patient management decisions. Negative results must be combined with clinical observations, patient history, and epidemiological information. The expected result is Negative. Fact Sheet for  Patients: hSugarRoll.beFact Sheet for Healthcare Providers: hhttps://www.woods-mathews.com/This test is not yet approved or cleared by the [place] and  has been authorized for detection and/or diagnosis of SARS-CoV-2 by FDA under an Emergency Use Authorization (EUA). This EUA will remain  in effect (meaning this test can be used) for the duration of the COVID-19 declaration under Section 56 4(b)(1) of the Act, 21 U.S.C. section 360bbb-3(b)(1), unless the authorization is terminated or revoked sooner. Performed at [facility] [address], [place] [address]     Labs: BNP (last 3 results)  Recent Labs    09/22/18 1839 06/06/19 1759  BNP 29.0 161.0*   Basic Metabolic Panel: Recent Labs  Lab 06/06/19 1750 06/06/19 1907 06/06/19 2304 06/07/19 0141  NA 139 143  --  138  K 4.6 3.6  --  4.3  CL 103  --   --  99  CO2 25  --   --  25  GLUCOSE 159*  --   --  254*  BUN 24*  --   --  23  CREATININE 1.73*  --  1.39* 1.36*  CALCIUM 8.6*  --   --  8.8*   Liver Function Tests: Recent Labs  Lab 06/06/19 1750  AST 15  ALT 19  ALKPHOS 61  BILITOT 0.5  PROT 6.3*  ALBUMIN 3.3*   No results for input(s): LIPASE, AMYLASE in the last 168 hours. No results for input(s): AMMONIA in the last 168 hours. CBC: Recent Labs  Lab 06/06/19 1750 06/06/19 1907 06/06/19 2304 06/07/19 0141  WBC 10.5  --  11.5* 10.9*  NEUTROABS 9.1*  --   --   --   HGB 9.8* 7.5* 10.4* 10.3*  HCT 31.6* 22.0* 33.5* 32.8*  MCV 98.4  --  98.0 97.6  PLT 226  --  239 229   Cardiac Enzymes: No results for input(s): CKTOTAL, CKMB, CKMBINDEX, TROPONINI in the last 168 hours. BNP: Invalid input(s): POCBNP CBG: Recent Labs  Lab 06/07/19 0542 06/07/19 1126  GLUCAP 257* 300*   D-Dimer Recent Labs    06/06/19 1750  DDIMER 1.42*   Hgb A1c No results for input(s): HGBA1C in the last 72 hours. Lipid Profile Recent Labs     06/07/19 0141  CHOL 149  HDL 57  LDLCALC 84  TRIG 41  CHOLHDL 2.6   Thyroid function studies No results for input(s): TSH, T4TOTAL, T3FREE, THYROIDAB in the last 72 hours.  Invalid input(s): FREET3 Anemia work up No results for input(s): VITAMINB12, FOLATE, FERRITIN, TIBC, IRON, RETICCTPCT in the last 72 hours. Urinalysis    Component Value Date/Time   COLORURINE Yellow 04/20/2019 0000   APPEARANCEUR Clear 04/20/2019 0000   LABSPEC 1.035 (H) 12/27/2018 2230   PHURINE 7.0 12/27/2018 2230   GLUCOSEU NEGATIVE 12/27/2018 2230   HGBUR LARGE (A) 12/27/2018 2230   BILIRUBINUR NEGATIVE 12/27/2018 2230   KETONESUR NEGATIVE 12/27/2018 2230   PROTEINUR 100 (A) 12/27/2018 2230   NITRITE NEGATIVE 12/27/2018 2230   LEUKOCYTESUR NEGATIVE 12/27/2018 2230   Sepsis Labs Invalid input(s): PROCALCITONIN,  WBC,  LACTICIDVEN Microbiology Recent Results (from the past 240 hour(s))  SARS CORONAVIRUS 2 (TAT 6-24 HRS) Nasopharyngeal Nasopharyngeal Swab     Status: None   Collection Time: 06/06/19  8:15 PM   Specimen: Nasopharyngeal Swab  Result Value Ref Range Status   SARS Coronavirus 2 NEGATIVE NEGATIVE Final    Comment: (NOTE) SARS-CoV-2 target nucleic acids are NOT DETECTED. The SARS-CoV-2 RNA is generally detectable in upper and lower respiratory specimens during the acute phase of infection. Negative results do not preclude SARS-CoV-2 infection, do not rule out co-infections with other pathogens, and should not be used as the sole basis for treatment or other patient management decisions. Negative results must be combined with clinical observations, patient history, and epidemiological information. The expected result is Negative. Fact Sheet for Patients: SugarRoll.be Fact Sheet for Healthcare Providers: https://www.woods-mathews.com/ This test is not yet approved or cleared by the [place] FDA and  has been authorized for detection and/or  diagnosis of SARS-CoV-2 by FDA under an  Emergency Use Authorization (EUA). This EUA will remain  in effect (meaning this test can be used) for the duration of the COVID-19 declaration under Section 56 4(b)(1) of the Act, 21 U.S.C. section 360bbb-3(b)(1), unless the authorization is terminated or revoked sooner. Performed at [facility], [place] [address], [place], [place] [address]      Time coordinating discharge: Over 30 minutes  SIGNED:   Darliss Cheney, MD  [facility] 06/07/2019, 1:57 PM  If 7PM-7AM, please contact night-coverage [email or web address]

## 2019-06-07 NOTE — Progress Notes (Addendum)
Subjective:   [name] [name] is a 79 y.o. female who presents for Medicare Annual (Subsequent) preventive examination.    This visit is being conducted through telemedicine due to the COVID-19 pandemic. This patient has given me verbal consent via doximity to conduct this visit, patient states they are participating from their home address. Some vital signs may be absent or patient reported.    Patient identification: identified by name, DOB, and current address  Review of Systems:  N/A  Cardiac Risk Factors include: advanced age (>56mn, >>6women);diabetes mellitus;dyslipidemia;hypertension     Objective:     Vitals: There were no vitals taken for this visit.  There is no height or weight on file to calculate BMI. Unable to obtain vitals due to visit being conducted via telephonically.   Advanced Directives 06/08/2019 06/06/2019 12/28/2018 12/28/2018 12/27/2018 12/26/2018 09/23/2018  Does Patient Have a Medical Advance Directive? Yes Yes - No No Yes No  Type of [place]amed[place]of [place] will Living will - Living will - Living will -  Does patient want to make changes to medical advance directive? - - - - - - -  Copy of [place] Chart? No - copy requested - - No - copy requested - No - copy requested -  Would patient like information on creating a medical advance directive? - - No - Patient declined - - - -    Tobacco Social History   Tobacco Use  Smoking Status Former Smoker  . Packs/day: 1.00  . Years: 50.00  . Pack years: 50.00  . Types: Cigarettes  . Quit date: 10/03/2015  . Years since quitting: 3.6  Smokeless Tobacco Never Used  Tobacco Comment   smokes 3 cigs daily 05/06/15. Pt instructed to quit.     Counseling given: Not Answered Comment: smokes 3 cigs daily 05/06/15. Pt instructed to quit.   Clinical Intake:  Pre-visit preparation completed: Yes  Pain : No/denies pain Pain Score: 0-No pain     Nutritional  Risks: None Diabetes: Yes  How often do you need to have someone help you when you read instructions, pamphlets, or other written materials from your doctor or pharmacy?: 1 - Never   Diabetes:  Is the patient diabetic?  Yes  If diabetic, was a CBG obtained today?  No  Did the patient bring in their glucometer from home?  No  How often do you monitor your CBG's? Once a day.   Financial Strains and Diabetes Management:  Are you having any financial strains with the device, your supplies or your medication? No .  Does the patient want to be seen by Chronic Care Management for management of their diabetes?  No  Would the patient like to be referred to a Nutritionist or for Diabetic Management?  No   Diabetic Exams:  Diabetic Eye Exam: Completed 04/10/19. Repeat yearly.  Diabetic Foot Exam: Completed 05/24/17. Pt has been advised about the importance in completing this exam. Note made to follow up on this at next in office visit.     Interpreter Needed?: No  Information [facility] :: [facility]  Past Medical History:  Diagnosis Date  . Abnormal CT lung screening 10/17/2015  . [place]   . Coronary artery disease, non-occlusive    a. cath 2006: min nonobs CAD; b. cath 12/2010: cath LAD 50%, RCA 60%; c. 08/2013: Minimal luminal irregs, right dominant system with no significant CAD, diffuse luminal irregs  noted. Normal EF 55%, no AS or MS.   . Diabetes mellitus   . Hyperlipemia    Followed by Dr. Rosanna Randy  . Hypertension   . Lung cancer ([place])   . Macular degeneration    rt  . Personal history of tobacco use, presenting hazards to health 10/15/2015  . Stroke [facility])    Past Surgical History:  Procedure Laterality Date  . ABDOMINAL HYSTERECTOMY    . ANTERIOR CERVICAL DECOMP/DISCECTOMY FUSION N/A 10/19/2013   Procedure: CERVICAL FIVE-SIX ANTERIOR CERVICAL DECOMPRESSION WITH FUSION INTERBODY PROSTHESIS PLATING AND PEEK CAGE;  Surgeon: Ophelia Charter, MD;  Location: [place] NEURO ORS;  Service: Neurosurgery;  Laterality: N/A;  . BACK SURGERY  80's  . BREAST CYST EXCISION Left    left negative   . CARDIAC CATHETERIZATION  05/2004  . CATARACT EXTRACTION Left   . CHOLECYSTECTOMY    . COLONOSCOPY N/A 12/29/2018   Procedure: COLONOSCOPY;  Surgeon: Toledo, Benay Pike, MD;  Location: [facility] ENDOSCOPY;  Service: Gastroenterology;  Laterality: N/A;  . ENDARTERECTOMY Left 10/24/2014   Procedure: ENDARTERECTOMY CAROTID;  Surgeon: Algernon Huxley, MD;  Location: [facility] ORS;  Service: Vascular;  Laterality: Left;  . ENDOBRONCHIAL ULTRASOUND N/A 11/14/2015   Procedure: ENDOBRONCHIAL ULTRASOUND;  Surgeon: [name], MD;  Location: [facility] ORS;  Service: Cardiopulmonary;  Laterality: N/A;  . PERIPHERAL VASCULAR CATHETERIZATION N/A 12/04/2015   Procedure: Glori Luis Cath Insertion;  Surgeon: Algernon Huxley, MD;  Location: [place] CV LAB;  Service: Cardiovascular;  Laterality: N/A;  . PORTA CATH REMOVAL N/A 05/17/2017   Procedure: PORTA CATH REMOVAL;  Surgeon: Algernon Huxley, MD;  Location: [place] CV LAB;  Service: Cardiovascular;  Laterality: N/A;  . TONSILLECTOMY AND ADENOIDECTOMY    . VESICOVAGINAL FISTULA CLOSURE W/ TAH     Family History  Problem Relation Age of Onset  . Stroke Mother   . Heart attack Father   . Heart attack Brother   . Heart attack Brother   . Lung cancer Maternal Grandfather   . Heart attack Paternal Grandmother        MI   Social History   Socioeconomic History  . Marital status: Married    Spouse name: Not on file  . Number of children: 4  . Years of education: Not on file  . Highest education level: 11th grade  Occupational History  . Occupation: Retired, Licensed conveyancer: RETIRED  Tobacco Use  . Smoking status: Former Smoker    Packs/day: 1.00    Years: 50.00    Pack years: 50.00    Types: Cigarettes    Quit date: 10/03/2015    Years since quitting: 3.6  . Smokeless tobacco: Never Used  . Tobacco  comment: smokes 3 cigs daily 05/06/15. Pt instructed to quit.  Substance and Sexual Activity  . Alcohol use: No    Alcohol/week: 0.0 standard drinks  . Drug use: No  . Sexual activity: Never  Other Topics Concern  . Not on file  Social History Narrative   Married with 4 children   Lives at home with her husband.  Ambulates with a cane.   Social Determinants of Health   Financial Resource Strain: Low Risk   . Difficulty of Paying Living Expenses: Not very hard  Food Insecurity: No Food Insecurity  . Worried About Charity fundraiser in the Last Year: Never true  . Ran Out of Food in the Last Year: Never true  Transportation Needs: No Transportation Needs  .  Lack of Transportation (Medical): No  . Lack of Transportation (Non-Medical): No  Physical Activity: Inactive  . Days of Exercise per Week: 0 days  . Minutes of Exercise per Session: 0 min  Stress: No Stress Concern Present  . Feeling of Stress : Not at all  Social Connections: Somewhat Isolated  . Frequency of Communication with Friends and Family: More than three times a week  . Frequency of Social Gatherings with Friends and Family: Never  . Attends Religious Services: Never  . Active Member of Clubs or Organizations: No  . Attends Archivist Meetings: Never  . Marital Status: Married    Outpatient Encounter Medications as of 06/08/2019  Medication Sig  . albuterol (VENTOLIN HFA) 108 (90 Base) MCG/ACT inhaler Inhale 2 puffs into the lungs every 4 (four) hours as needed for wheezing or shortness of breath.  Marland Kitchen amLODipine (NORVASC) 5 MG tablet Take 5 mg by mouth daily.   Marland Kitchen aspirin EC 81 MG EC tablet Take 1 tablet (81 mg total) by mouth daily.  Marland Kitchen atorvastatin (LIPITOR) 40 MG tablet Take 1 tablet (40 mg total) by mouth daily at 6 PM.  . blood glucose meter kit and supplies Dispense based on patient and insurance preference. Use once daily as directed. (FOR ICD-10  E11.42).  Marland Kitchen clopidogrel (PLAVIX) 75 MG tablet TAKE 1  TABLET BY MOUTH EVERY DAY (Patient taking differently: Take 75 mg by mouth daily. )  . fluticasone (FLONASE) 50 MCG/ACT nasal spray Place 2 sprays into both nostrils daily. (Patient taking differently: Place 2 sprays into both nostrils daily as needed for allergies. )  . gabapentin (NEURONTIN) 600 MG tablet TAKE ONE-HALF TABLET BY  MOUTH TWO TIMES DAILY (Patient taking differently: Take 300 mg by mouth 2 (two) times daily. )  . glimepiride (AMARYL) 2 MG tablet TAKE 1 TABLET BY MOUTH TWO  TIMES DAILY  . glucose blood (ONETOUCH ULTRA) test strip USE TO CHECK BLOOD SUGAR ONCE DAILY  . ipratropium-albuterol (DUONEB) 0.5-2.5 (3) MG/3ML SOLN Take 3 mLs by nebulization every 4 (four) hours as needed.  . isosorbide mononitrate (IMDUR) 30 MG 24 hr tablet Take 60 mg by mouth daily.  . isosorbide mononitrate (IMDUR) 60 MG 24 hr tablet Take 1 tablet (60 mg total) by mouth daily.  Marland Kitchen LORazepam (ATIVAN) 1 MG tablet Take 1 tablet (1 mg total) by mouth 2 (two) times daily as needed for anxiety.  . magnesium oxide (MAG-OX) 400 (241.3 MG) MG tablet Take 400 mg by mouth 2 (two) times daily.   . metFORMIN (GLUCOPHAGE) 500 MG tablet Take 1 tablet (500 mg total) by mouth 2 (two) times daily with a meal. (Patient taking differently: Take 500 mg by mouth daily with breakfast. Can only tolerate once daily with meal)  . metoprolol succinate (TOPROL-XL) 100 MG 24 hr tablet Take 1 tablet (100 mg total) by mouth daily.  . mometasone (ELOCON) 0.1 % lotion Apply 4 drops topically at bedtime as needed (dry skin).   . Multiple Vitamin (MULTIVITAMIN) tablet Take 0.5 tablets by mouth 2 (two) times daily.  . Multiple Vitamins-Minerals (ICAPS) CAPS Take 1 capsule by mouth daily.   . nitroGLYCERIN (NITROSTAT) 0.4 MG SL tablet Place 1 tablet (0.4 mg total) under the tongue every 5 (five) minutes as needed for chest pain.  . pantoprazole (PROTONIX) 40 MG tablet TAKE 1 TABLET BY MOUTH TWICE A DAY  . sucralfate (CARAFATE) 1 g tablet Take 1  tablet (1 g total) by mouth 2 (two) times daily.  Marland Kitchen  vitamin E (VITAMIN E) 400 UNIT capsule Take 400 Units by mouth daily.  Marland Kitchen doxycycline (VIBRA-TABS) 100 MG tablet Take 1 tablet (100 mg total) by mouth 2 (two) times daily for 7 days. (Patient not taking: Reported on 06/08/2019)  . predniSONE (DELTASONE) 20 MG tablet Take 2 tablets (40 mg total) by mouth daily with breakfast for 5 days. (Patient not taking: Reported on 06/08/2019)  . predniSONE (DELTASONE) 50 MG tablet Take 1 tablet (50 mg total) by mouth daily with breakfast for 4 days. (Patient not taking: Reported on 06/08/2019)  . [DISCONTINUED] isosorbide mononitrate (IMDUR) 30 MG 24 hr tablet TAKE 1 TABLET BY MOUTH  DAILY   Facility-Administered Encounter Medications as of 06/08/2019  Medication  . ondansetron (ZOFRAN) 8 mg, dexamethasone (DECADRON) 10 mg in sodium chloride 0.9 % 50 mL IVPB  . [DISCONTINUED] acetaminophen (TYLENOL) suppository 650 mg  . [DISCONTINUED] acetaminophen (TYLENOL) tablet 650 mg  . [DISCONTINUED] albuterol (PROVENTIL) (2.5 MG/3ML) 0.083% nebulizer solution 2.5 mg  . [DISCONTINUED] amLODipine (NORVASC) tablet 5 mg  . [DISCONTINUED] aspirin EC tablet 81 mg  . [DISCONTINUED] atorvastatin (LIPITOR) tablet 40 mg  . [DISCONTINUED] clopidogrel (PLAVIX) tablet 75 mg  . [DISCONTINUED] doxycycline (VIBRA-TABS) tablet 100 mg  . [DISCONTINUED] enoxaparin (LOVENOX) injection 30 mg  . [DISCONTINUED] insulin aspart (novoLOG) injection 0-9 Units  . [DISCONTINUED] ipratropium (ATROVENT) nebulizer solution 0.5 mg  . [DISCONTINUED] isosorbide mononitrate (IMDUR) 24 hr tablet 30 mg  . [DISCONTINUED] magnesium oxide (MAG-OX) tablet 400 mg  . [DISCONTINUED] methylPREDNISolone sodium succinate (SOLU-MEDROL) 40 mg/mL injection 40 mg  . [DISCONTINUED] metoprolol succinate (TOPROL-XL) 24 hr tablet 100 mg  . [DISCONTINUED] mometasone-formoterol (DULERA) 100-5 MCG/ACT inhaler 2 puff  . [DISCONTINUED] nitroGLYCERIN (NITROSTAT) SL tablet 0.4 mg    . [DISCONTINUED] ondansetron (ZOFRAN) injection 4 mg  . [DISCONTINUED] ondansetron (ZOFRAN) tablet 4 mg  . [DISCONTINUED] pantoprazole (PROTONIX) EC tablet 40 mg    Activities of Daily Living In your present state of health, do you have any difficulty performing the following activities: 06/08/2019 06/07/2019  Hearing? N N  Vision? N N  Difficulty concentrating or making decisions? Y N  Walking or climbing stairs? Y Y  Comment Due to leg weakness. -  Dressing or bathing? Y N  Comment Husband assists with bathing. -  Doing errands, shopping? Y N  Comment Does not drive. -  Preparing Food and eating ? N -  Using the Toilet? N -  In the past six months, have you accidently leaked urine? Y -  Comment Wears depends daily. -  Do you have problems with loss of bowel control? N -  Managing your Medications? Y -  Comment Husband manages medications. -  Managing your Finances? Y -  Comment Husband takes care of finances. -  Housekeeping or managing your Housekeeping? Y -  Comment Has assistance cleaning. -  Some recent data might be hidden    Patient Care Team: Jerrol Banana., MD as PCP - General (Unknown Physician Specialty) [name], MD as PCP - Cardiology (Cardiology) Dew, Erskine Squibb, MD as Consulting Physician (Vascular Surgery) Pa, [place] as Consulting Physician (Optometry) [name], Kathlene November, MD as Consulting Physician (Oncology) [name], MD as Referring Physician (Radiation Oncology) Murlean Iba, MD as Consulting Physician (Internal Medicine) Cathi Roan, [facility] (Pharmacist)    Assessment:   This is a routine wellness examination for [name] [name].  Exercise Activities and Dietary recommendations Current Exercise Habits: The patient does not participate in regular exercise at  present, Exercise limited by: orthopedic condition(s)  Goals      Patient Stated   . I want to stay well (pt-stated)     Current Barriers:  . High pill  burden  Pharmacist Clinical Goal(s):  Marland Kitchen Over the next 0- days, patient will demonstrate improved understanding of prescribed medications and rationale for usage as evidenced by adherence to medications by patient report and fill history  Interventions: . Comprehensive medication review performed. . Discussed plans with patient for ongoing care management follow up and provided patient with direct contact information for care management team  Patient Self Care Activities:  . Self administers medications as prescribed . Attends all scheduled provider appointments . Calls provider office for new concerns or questions  Initial goal documentation         Other   . DIET - INCREASE WATER INTAKE     Recommend to drink at least 6-8 8oz glasses of water per day.    . Prevent falls     Recommend several different ways to avoid falls and how to prep home to prevent falls.       Fall Risk: Fall Risk  06/08/2019 06/07/2018 06/09/2017 05/31/2017 12/29/2016  Falls in the past year? 1 1 Yes Yes Yes  Number falls in past yr: _0 or more 2 or more 2 or more  Injury with Fall? 0 0 - No No  Comment - - - - -  Risk Factor Category  - - High Fall Risk High Fall Risk -  Risk for fall due to : Impaired balance/gait;Impaired mobility Impaired mobility;Impaired balance/gait - Impaired balance/gait -  Risk for fall due to: Comment - - - - -  Follow up Falls prevention discussed Falls prevention discussed - Falls prevention discussed Falls evaluation completed;Falls prevention discussed    FALL RISK PREVENTION PERTAINING TO THE HOME:  Any stairs in or around the home? Yes  If so, are there any without handrails? No   Home free of loose throw rugs in walkways, pet beds, electrical cords, etc? Yes  Adequate lighting in your home to reduce risk of falls? Yes   ASSISTIVE DEVICES UTILIZED TO PREVENT FALLS:  Life alert? No  Use of a cane, walker or w/c? Yes  Grab bars in the bathroom? No  Shower chair  or bench in shower? Yes  Elevated toilet seat or a handicapped toilet? Yes   TIMED UP AND GO:  Was the test performed? No .    Depression Screen PHQ 2/9 Scores 06/08/2019 06/07/2018 06/07/2018 05/31/2017  PHQ - 2 Score 0 0 0 0  PHQ- 9 Score - 4 - -     Cognitive Function: Declined today.         Immunization History  Administered Date(s) Administered  . Fluad Quad(high Dose 65+) 12/30/2018  . Influenza Split 01/17/2010, 01/05/2012, 02/29/2012  . Influenza Whole 12/05/2009, 12/10/2010  . Influenza, High Dose Seasonal PF 02/14/2014, 01/01/2015, 12/29/2016, 01/25/2018  . Influenza,inj,Quad PF,6+ Mos 01/04/2013, 02/27/2013, 03/19/2016  . Influenza-Unspecified 02/04/2014  . PFIZER SARS-COV-2 Vaccination 05/29/2019  . Pneumococcal Conjugate-13 02/29/2012  . Pneumococcal Polysaccharide-23 01/05/2008, 01/05/2012  . Tdap 01/05/2012    Qualifies for Shingles Vaccine? Yes . Due for Shingrix. Pt has been advised to call insurance company to determine out of pocket expense. Advised may also receive vaccine at local pharmacy or Health Dept. Verbalized acceptance and understanding.  Tdap: Up to date  Flu Vaccine: Up to date  Pneumococcal Vaccine: Completed series  Screening Tests  Health Maintenance  Topic Date Due  . URINE MICROALBUMIN  12/29/2017  . FOOT EXAM  05/24/2018  . HEMOGLOBIN A1C  07/11/2019  . OPHTHALMOLOGY EXAM  04/09/2020  . TETANUS/TDAP  01/04/2022  . DEXA SCAN  01/20/2022  . INFLUENZA VACCINE  Completed  . PNA vac Low Risk Adult  Completed    Cancer Screenings:  Colorectal Screening: No longer required.   Mammogram: No longer required.   Bone Density: Completed 01/21/12. Previous DEXA scan was normal. No repeat needed unless advised by a physician.  Lung Cancer Screening: (Low Dose CT Chest recommended if Age 108-80 years, 30 pack-year currently smoking OR have quit w/in 15years.) does not qualify.   Additional Screening:  Dental Screening: Recommended annual  dental exams for proper oral hygiene   Community Resource Referral:  CRR required this visit?  No       Plan:  I have personally reviewed and addressed the Medicare Annual Wellness questionnaire and have noted the following in the patient's chart:  A. Medical and social history B. Use of alcohol, tobacco or illicit drugs  C. Current medications and supplements D. Functional ability and status E.  Nutritional status F.  Physical activity G. Advance directives H. List of other physicians I.  Hospitalizations, surgeries, and ER visits in previous 12 months J.  [place] such as hearing and vision if needed, cognitive and depression L. Referrals and appointments   In addition, I have reviewed and discussed with patient certain preventive protocols, quality metrics, and best practice recommendations. A written personalized care plan for preventive services as well as general preventive health recommendations were provided to patient.   Glendora Score, [place]  09/05/6071 Nurse Health Advisor   Nurse Notes: Pt needs a diabetic foot exam, urine check and Hgb A1c check at next in office visit. Please see TCM call for f/u on hospital d/c. Pt declined referral for home assistance devices due to already applying to [facility] for devices. Advised pt to f/u on status and if still needs assistance then to make clinic aware and we will put a referral in.

## 2019-06-07 NOTE — Consult Note (Addendum)
Cardiology Consultation:   Patient ID: [name] [name] MRN: [id number]; DOB: May 03, 1940  Admit date: 06/06/2019 Date of Consult: 06/07/2019  Primary Care Provider: Jerrol Banana., MD Primary Cardiologist: Ida Rogue, MD  Primary Electrophysiologist:  None    Patient Profile:   [name] [name] is a 79 y.o. female with a hx of nonobstructive CAD by [facility] 2012, normal myoview 2016, COPD, hx of tobacco abuse, DM, HTN, HLD, hx of lung cancer s/p chem and radiation, and CKD stage III-IV who is being seen today for the evaluation of chest pain at the request of Dr. Doristine Bosworth.  History of Present Illness:   [name] [name] has CAD. She had a heart cath in  Last heart cath 2012 with disease in the ostial RCA. This was not thought to be causing her symptoms and myoview was recommended to evaluate reversible ischemia. Nuclear stress test in 2012 was normal. Pt has another heart cath in 2015 with no significant CAD (disease seen in 2012 was not appreciated in the LAD or RCA), and normal myoview 2016. She quit smoking June 2017. She completed chemo and radiation for lung cancer. She was last seen in clinic with Dr. Rockey Situ on 11/02/16 and was doing well at that time. She has a history of CVA and is on ASA and plavix. No ACEI per pulmonary for cough. Ongoing chronic SOB. Occasional chest pain with intermittent nitro use, no escalation of symptoms. Lipitor was increased to 40 mg as LDL was not at goal.  She had a stroke 05/06/18 and placed on ASA and plavix.  She was seen in the ER 09/23/18 for chest pain seen by Dr. Humphrey Rolls. She ruled out. Echo reviewed from Feb 2020 with normal EF. She was discharged without further workup. Does not appear she followed up with cardiology. She was hospitalized 12/2018 for rectal bleeding. Appears she was continued on DAPT at that time.   She presented to her PCP 06/06/19 and felt to be in respiratory distress and urged to go to the ER. Pt initially refused so PCP prescribed prednisone  and doxy. Pt presented to ER 06/06/19 with wheezing, SOB, and pleuritic chest pain on right chest wall. COVID negative. On arrival, hs troponin x 4 negative, D-dimer 1.42, BNP 128. VQ scan pending given renal function. Cardiology was consulted for pleuritic chest pain.   She received COVID-19 vaccine on 05/29/19.  On my interview, patient is alert and oriented to place and situation. However, she answers "a couple of months ago" to most questions. It sounds as though her intermittent substernal chest pain treated with nitro has been stable. She has this once per month and is always relieved by nitro. She is sedentary due to neuropathy, so the exertional nature of this chest pain is unknown. She also reports pain in her right side/right lateral chest wall. She is not tender to palpation and the pain is worse when she breathes in deeply. The pain is not changed with position. When asked about the prednisone and ABX recently prescribed to her she states she never picked up the medication and that was a couple of months ago. She denies lower extremity edema, orthopnea, and recent syncope. She appears comfortable in bed on NCO2.   Heart Pathway Score:  HEAR Score: 5  Past Medical History:  Diagnosis Date  . Abnormal CT lung screening 10/17/2015  . COPD (chronic obstructive pulmonary disease) ([place])   . Coronary artery disease, non-occlusive    a. cath 2006: min nonobs CAD; b.  cath 12/2010: cath LAD 50%, RCA 60%; c. 08/2013: Minimal luminal irregs, right dominant system with no significant CAD, diffuse luminal irregs noted. Normal EF 55%, no AS or MS.   . Diabetes mellitus   . Hyperlipemia    Followed by Dr. Rosanna Randy  . Hypertension   . Lung cancer ([place])   . Macular degeneration    rt  . Personal history of tobacco use, presenting hazards to health 10/15/2015  . Pneumonia    hx  . Shortness of breath   . Stroke [facility])     Past Surgical History:  Procedure Laterality Date  . ABDOMINAL HYSTERECTOMY    .  ANTERIOR CERVICAL DECOMP/DISCECTOMY FUSION N/A 10/19/2013   Procedure: CERVICAL FIVE-SIX ANTERIOR CERVICAL DECOMPRESSION WITH FUSION INTERBODY PROSTHESIS PLATING AND PEEK CAGE;  Surgeon: Ophelia Charter, MD;  Location: [place] NEURO ORS;  Service: Neurosurgery;  Laterality: N/A;  . BACK SURGERY  80's  . BREAST CYST EXCISION Left    left negative   . CARDIAC CATHETERIZATION  05/2004  . CATARACT EXTRACTION Left   . CHOLECYSTECTOMY    . COLONOSCOPY N/A 12/29/2018   Procedure: COLONOSCOPY;  Surgeon: Toledo, Benay Pike, MD;  Location: [facility] ENDOSCOPY;  Service: Gastroenterology;  Laterality: N/A;  . ENDARTERECTOMY Left 10/24/2014   Procedure: ENDARTERECTOMY CAROTID;  Surgeon: Algernon Huxley, MD;  Location: [facility] ORS;  Service: Vascular;  Laterality: Left;  . ENDOBRONCHIAL ULTRASOUND N/A 11/14/2015   Procedure: ENDOBRONCHIAL ULTRASOUND;  Surgeon: [name], MD;  Location: [facility] ORS;  Service: Cardiopulmonary;  Laterality: N/A;  . PERIPHERAL VASCULAR CATHETERIZATION N/A 12/04/2015   Procedure: Glori Luis Cath Insertion;  Surgeon: Algernon Huxley, MD;  Location: [place] CV LAB;  Service: Cardiovascular;  Laterality: N/A;  . PORTA CATH REMOVAL N/A 05/17/2017   Procedure: PORTA CATH REMOVAL;  Surgeon: Algernon Huxley, MD;  Location: [place] CV LAB;  Service: Cardiovascular;  Laterality: N/A;  . TONSILLECTOMY AND ADENOIDECTOMY    . VESICOVAGINAL FISTULA CLOSURE W/ TAH       Home Medications:  Prior to Admission medications   Medication Sig Start Date End Date Taking? Authorizing Provider  albuterol (VENTOLIN HFA) 108 (90 Base) MCG/ACT inhaler Inhale 2 puffs into the lungs every 4 (four) hours as needed for wheezing or shortness of breath. 09/26/18  Yes Jerrol Banana., MD  amLODipine (NORVASC) 5 MG tablet Take 5 mg by mouth daily.  09/27/18  Yes [provider]  aspirin EC 81 MG EC tablet Take 1 tablet (81 mg total) by mouth daily. 05/08/18  Yes Mayo, Pete Pelt, MD  atorvastatin (LIPITOR) 40 MG tablet  Take 1 tablet (40 mg total) by mouth daily at 6 PM. 07/19/18  Yes Jerrol Banana., MD  clopidogrel (PLAVIX) 75 MG tablet TAKE 1 TABLET BY MOUTH EVERY DAY Patient taking differently: Take 75 mg by mouth daily.  08/30/18  Yes Jerrol Banana., MD  doxycycline (VIBRA-TABS) 100 MG tablet Take 1 tablet (100 mg total) by mouth 2 (two) times daily for 7 days. 06/06/19 06/13/19 Yes Pollak, Wendee Beavers, PA-C  fluticasone (FLONASE) 50 MCG/ACT nasal spray Place 2 sprays into both nostrils daily. Patient taking differently: Place 2 sprays into both nostrils daily as needed for allergies.  07/21/17  Yes Bacigalupo, Dionne Bucy, MD  gabapentin (NEURONTIN) 600 MG tablet TAKE ONE-HALF TABLET BY  MOUTH TWO TIMES DAILY Patient taking differently: Take 300 mg by mouth 2 (two) times daily.  05/04/19  Yes Jerrol Banana., MD  glimepiride (AMARYL) 2 MG tablet TAKE 1 TABLET BY MOUTH TWO  TIMES DAILY 05/04/19  Yes Jerrol Banana., MD  ipratropium-albuterol (DUONEB) 0.5-2.5 (3) MG/3ML SOLN Take 3 mLs by nebulization every 4 (four) hours as needed. 06/06/19  Yes Carles Collet M, PA-C  isosorbide mononitrate (IMDUR) 30 MG 24 hr tablet TAKE 1 TABLET BY MOUTH  DAILY 03/06/19  Yes Jerrol Banana., MD  LORazepam (ATIVAN) 1 MG tablet Take 1 tablet (1 mg total) by mouth 2 (two) times daily as needed for anxiety. 05/24/17  Yes Jerrol Banana., MD  magnesium oxide (MAG-OX) 400 (241.3 MG) MG tablet Take 400 mg by mouth 2 (two) times daily.  08/04/13  Yes [provider]  metFORMIN (GLUCOPHAGE) 500 MG tablet Take 1 tablet (500 mg total) by mouth 2 (two) times daily with a meal. Patient taking differently: Take 500 mg by mouth daily with breakfast. Can only tolerate once daily with meal 07/19/18  Yes Jerrol Banana., MD  metoprolol succinate (TOPROL-XL) 100 MG 24 hr tablet Take 1 tablet (100 mg total) by mouth daily. 02/17/19  Yes Jerrol Banana., MD  mometasone (ELOCON) 0.1 % lotion Apply 4  drops topically at bedtime as needed (dry skin).  05/17/18  Yes [provider]  Multiple Vitamin (MULTIVITAMIN) tablet Take 0.5 tablets by mouth 2 (two) times daily.   Yes [provider]  Multiple Vitamins-Minerals (ICAPS) CAPS Take 1 capsule by mouth daily.    Yes [provider]  nitroGLYCERIN (NITROSTAT) 0.4 MG SL tablet Place 1 tablet (0.4 mg total) under the tongue every 5 (five) minutes as needed for chest pain. 02/15/19  Yes Jerrol Banana., MD  pantoprazole (PROTONIX) 40 MG tablet TAKE 1 TABLET BY MOUTH TWICE A DAY 01/22/19  Yes Jerrol Banana., MD  predniSONE (DELTASONE) 20 MG tablet Take 2 tablets (40 mg total) by mouth daily with breakfast for 5 days. 06/06/19 06/11/19 Yes Trinna Post, PA-C  sucralfate (CARAFATE) 1 g tablet Take 1 tablet (1 g total) by mouth 2 (two) times daily. 02/17/19  Yes Jerrol Banana., MD  vitamin E (VITAMIN E) 400 UNIT capsule Take 400 Units by mouth daily.   Yes [provider]  blood glucose meter kit and supplies Dispense based on patient and insurance preference. Use once daily as directed. (FOR ICD-10  E11.42). 06/02/19   Chrismon, Vickki Muff, PA  glucose blood (ONETOUCH ULTRA) test strip USE TO CHECK BLOOD SUGAR ONCE DAILY 02/15/19   Jerrol Banana., MD    Inpatient Medications: Scheduled Meds: . albuterol  2.5 mg Inhalation BID  . amLODipine  5 mg Oral Daily  . aspirin EC  81 mg Oral Daily  . atorvastatin  40 mg Oral q1800  . clopidogrel  75 mg Oral Daily  . doxycycline  100 mg Oral BID  . enoxaparin (LOVENOX) injection  30 mg Subcutaneous Q24H  . insulin aspart  0-9 Units Subcutaneous TID WC  . ipratropium  0.5 mg Nebulization BID  . isosorbide mononitrate  30 mg Oral Daily  . magnesium oxide  400 mg Oral BID  . methylPREDNISolone (SOLU-MEDROL) injection  40 mg Intravenous Q12H  . metoprolol succinate  100 mg Oral Daily  . mometasone-formoterol  2 puff Inhalation BID  . pantoprazole   40 mg Oral BID   Continuous Infusions:  PRN Meds: acetaminophen **OR** acetaminophen, nitroGLYCERIN, ondansetron **OR** ondansetron (ZOFRAN) IV  Allergies:    Allergies  Allergen Reactions  . Coconut Fatty Acids Swelling and Other (See Comments)    Throat swells    Social History:   Social History   Socioeconomic History  . Marital status: Married    Spouse name: Not on file  . Number of children: 4  . Years of education: Not on file  . Highest education level: 11th grade  Occupational History  . Occupation: Retired, Licensed conveyancer: RETIRED  Tobacco Use  . Smoking status: Former Smoker    Packs/day: 1.00    Years: 50.00    Pack years: 50.00    Types: Cigarettes    Quit date: 10/03/2015    Years since quitting: 3.6  . Smokeless tobacco: Never Used  . Tobacco comment: smokes 3 cigs daily 05/06/15. Pt instructed to quit.  Substance and Sexual Activity  . Alcohol use: No    Alcohol/week: 0.0 standard drinks  . Drug use: No  . Sexual activity: Never  Other Topics Concern  . Not on file  Social History Narrative   Married with 4 children   Gets regular exercise   Lives at home with her husband.  Ambulates with a cane.   Social Determinants of Health   Financial Resource Strain: Medium Risk  . Difficulty of Paying Living Expenses: Somewhat hard  Food Insecurity:   . Worried About Charity fundraiser in the Last Year: Not on file  . Ran Out of Food in the Last Year: Not on file  Transportation Needs:   . Lack of Transportation (Medical): Not on file  . Lack of Transportation (Non-Medical): Not on file  Physical Activity: Inactive  . Days of Exercise per Week: 0 days  . Minutes of Exercise per Session: 0 min  Stress:   . Feeling of Stress : Not on file  Social Connections: Unknown  . Frequency of Communication with Friends and Family: Patient refused  . Frequency of Social Gatherings with Friends and Family: Patient refused  . Attends Religious  Services: Patient refused  . Active Member of Clubs or Organizations: Patient refused  . Attends Archivist Meetings: Patient refused  . Marital Status: Patient refused  Intimate Partner Violence: Unknown  . Fear of Current or Ex-Partner: Patient refused  . Emotionally Abused: Patient refused  . Physically Abused: Patient refused  . Sexually Abused: Patient refused    Family History:    Family History  Problem Relation Age of Onset  . Stroke Mother        Massive  . Heart attack Father        Massive  . Heart attack Brother   . Heart attack Brother   . Lung cancer Maternal Grandfather   . Heart attack Paternal Grandmother        MI     ROS:  Please see the history of present illness.   All other ROS reviewed and negative.     Physical Exam/Data:   Vitals:   06/07/19 0000 06/07/19 0140 06/07/19 0539 06/07/19 0747  BP: (!) 130/93 (!) 169/87 (!) 159/66   Pulse: 65 69 67 69  Resp: (!) '22 20 16 19  ' Temp:  98.1 F (36.7 C) (!) 97.4 F (36.3 C)   TempSrc:  Oral Oral   SpO2: 98%  97% 95%  Weight:      Height:        Intake/Output Summary (Last 24 hours) at 06/07/2019 1113 Last data filed at 06/07/2019 0200 Gross per 24 hour  Intake 240 ml  Output --  Net 240 ml   Last 3 Weights 06/06/2019 04/03/2019 01/10/2019  Weight (lbs) 136 lb 135 lb 148 lb  Weight (kg) 61.689 kg 61.236 kg 67.132 kg     Body mass index is 23.34 kg/m.  General:  elderly female, answers questions HEENT: normal Neck: no JVD Vascular: No carotid bruits Cardiac:  normal S1, S2; RRR; no murmur  Lungs:  Respirations unlabored,   Abd: soft, nontender, no hepatomegaly  Ext: no edema Musculoskeletal:  No deformities, BUE and BLE strength normal and equal Skin: warm and dry  Neuro:  CNs 2-12 intact, no focal abnormalities noted Psych:  Normal affect   EKG:  The EKG was personally reviewed and demonstrates:  Sinus rhythm, HR 65 Telemetry:  Telemetry was personally reviewed and demonstrates:   Sinus rhythm in the 60s  Relevant CV Studies:  Echo 05/07/18: 1. The left ventricle has normal systolic function of 10-25%. The cavity  size is normal. There is no left ventricular wall thickness. Echo evidence  of normal diastolic filling patterns.Unable to exclude region of  hypokinesis of the inferior wall.  2. The right ventricle is in size. There is normal systolic function.  Right ventricular systolic pressure is mildly elevated with an estimated  pressure of 34 mmHg.  3. Normal left atrial size.  4. The mitral valve normal in structure. Regurgitation is mild by color  flow Doppler.  5. Select images suggesting small PFO/ASD by color doppler. Bubble study  not performed.  6. Challenging images   Laboratory Data:  High Sensitivity Troponin:   Recent Labs  Lab 06/06/19 1750 06/06/19 2024 06/06/19 2304 06/07/19 0141  TROPONINIHS '7 11 9 9     ' Chemistry Recent Labs  Lab 06/06/19 1750 06/06/19 1907 06/06/19 2304 06/07/19 0141  NA 139 143  --  138  K 4.6 3.6  --  4.3  CL 103  --   --  99  CO2 25  --   --  25  GLUCOSE 159*  --   --  254*  BUN 24*  --   --  23  CREATININE 1.73*  --  1.39* 1.36*  CALCIUM 8.6*  --   --  8.8*  GFRNONAA 28*  --  36* 37*  GFRAA 32*  --  42* 43*  ANIONGAP 11  --   --  14    Recent Labs  Lab 06/06/19 1750  PROT 6.3*  ALBUMIN 3.3*  AST 15  ALT 19  ALKPHOS 61  BILITOT 0.5   Hematology Recent Labs  Lab 06/06/19 1750 06/06/19 1750 06/06/19 1907 06/06/19 2304 06/07/19 0141  WBC 10.5  --   --  11.5* 10.9*  RBC 3.21*  --   --  3.42* 3.36*  HGB 9.8*   < > 7.5* 10.4* 10.3*  HCT 31.6*   < > 22.0* 33.5* 32.8*  MCV 98.4  --   --  98.0 97.6  MCH 30.5  --   --  30.4 30.7  MCHC 31.0  --   --  31.0 31.4  RDW 16.0*  --   --  16.0* 15.8*  PLT 226  --   --  239 229   < > = values in this interval not displayed.   BNP Recent Labs  Lab 06/06/19 1759  BNP 127.7*    DDimer  Recent Labs  Lab 06/06/19 1750  DDIMER 1.42*      Radiology/Studies:  DG Chest Portable 1 View  Result Date: 06/06/2019 CLINICAL DATA:  Shortness of breath and chest pain EXAM: PORTABLE CHEST 1 VIEW COMPARISON:  09/22/2018 FINDINGS: Hyperinflation with emphysematous disease. No consolidation or effusion. Enlarged appearing central pulmonary arteries. No pneumothorax. Stable cardiomediastinal silhouette. IMPRESSION: No active disease. Electronically Signed   By: Donavan Foil M.D.   On: 06/06/2019 18:21       HEAR Score (for undifferentiated chest pain):  HEAR Score: 5    Assessment and Plan:   1. Pleuritic Chest pain on right lateral chest wall 2. CAD 3. Intermittent substernal chest pain relieved with nitro - pt describes chest pain on her right lateral chest wall worse with deep inspiration that started 2 months ago - she also describes substernal chest pain once monthly relieved with nitro - last heart cath and stress testing reassuring - LHC 2015 without significant CAD - myoview 2016 negative for reversible ischemia - hs troponin 7 --> 11 --> 9 --> 9 - EKG appears nonischemic - currently undergoing VQ scan for elevated D-dimer - she has ruled out for MI - her angina has been relatively stable, happens once monthly and relieved by nitro - she is not a candidate for CT coronary due to renal function - would need to wait 48 hrs for myoview, which can be done outpatient - will arrange for cardiology follow up - continue BB, statin, ASA, plavix, imdur, norvasc   4. Hx of CVA 04/2018 - neurology started DAPT with ASA and plavix - will need input regarding length of DAPT therapy and when to transition to monotherapy   5. Hypertension - 5 mg norvasc, 100 mg toprol, 30 mg imdur - pressures is not well-controlled - continue to monitor - can increase imdur to 60 mg given intermittent angina   6. Hyperlipidemia - will collect updated lipid panel - continue statin   7. DM - A1c 5.9% - continue home regimen        For questions or updates, please contact [place] Please consult [email or web address] for contact info under     Signed, Ledora Bottcher, PA  06/07/2019 11:13 AM   History and all data above reviewed.  Patient examined. She reports chest pain.  However, she has confusion.  I was not clear that she was giving me a clear history.   I talked to her husband.  He called EMS because she was having some shortness of breath.  Her husband says she was having increased confusion and somnolence.  Her saturations were in the mid 80s.  She has as needed oxygen and uses it at night.  She is not had any new fevers cough or chills.  She was transported to the hospital and did mention that she had some chest discomfort.  However, speaking to her husband she takes nitroglycerin once in a while but this seems to have been a stable pattern.  She really gets around very slowly because of neuropathy.  She ambulates minimally in the house.  He does not see her having significant discomfort with this.  She is not had any increased use of nitroglycerin.  She describes it as fleeting chest discomfort.  She does not think she has any associated symptoms.  Is kind of a squeezing discomfort.  It might be moderate but I cannot get a sense of this.  I do not think there is associated symptoms.  She is not describing PND or orthopnea.  She sleeps with her head elevated slightly and an adjustable bed.  I agree with  the findings as above.  The patient exam reveals COR: Regular rate and rhythm, no murmurs,  Lungs: Decreased breath sounds with bilateral diffuse crackles,  Abd: Positive bowel sounds normal in frequency in pitch, Ext 2+ upper pulses, diminished dorsalis pedis and posterior tibialis, no edema.  All available labs, radiology testing, previous records reviewed. Agree with documented assessment and plan.  Chest pain: This is atypical.  There was some reproducibility with palpation on the right side but I am not sure this is  the discomfort that she is describing.  After careful discussion with her husband I do not think that further ischemia work-up is indicated with a stable pattern of atypical pain and no objective evidence of ischemia.  We did increase her Imdur to 60 mg daily.  Of note I did speak with her husband and is supposed to get carotid Dopplers and I tried to arrange these for while she was still inpatient but she is being discharged.  Jeneen Rinks Nury Nebergall  2:17 PM  06/07/2019

## 2019-06-07 NOTE — Care Management (Signed)
ED CM received call concerning transportation for the,  PTAR was called and Medical Necessity form completed and sent to [facility].

## 2019-06-08 ENCOUNTER — Telehealth: Payer: Self-pay

## 2019-06-08 ENCOUNTER — Other Ambulatory Visit: Payer: Self-pay

## 2019-06-08 ENCOUNTER — Ambulatory Visit (INDEPENDENT_AMBULATORY_CARE_PROVIDER_SITE_OTHER): Payer: Medicare Other

## 2019-06-08 ENCOUNTER — Other Ambulatory Visit (INDEPENDENT_AMBULATORY_CARE_PROVIDER_SITE_OTHER): Payer: Self-pay | Admitting: Vascular Surgery

## 2019-06-08 DIAGNOSIS — I6523 Occlusion and stenosis of bilateral carotid arteries: Secondary | ICD-10-CM

## 2019-06-08 DIAGNOSIS — Z0000 Encounter for general adult medical examination without abnormal findings: Secondary | ICD-10-CM

## 2019-06-08 DIAGNOSIS — I10 Essential (primary) hypertension: Secondary | ICD-10-CM

## 2019-06-08 MED ORDER — ISOSORBIDE MONONITRATE ER 60 MG PO TB24: 60.0000 mg | ORAL_TABLET | Freq: Every day | ORAL | 3 refills | Status: DC

## 2019-06-08 NOTE — Telephone Encounter (Signed)
Patient was advised. Rx for Imdur sent to [facility].

## 2019-06-08 NOTE — Telephone Encounter (Signed)
Transition Care Management Follow-up Telephone Call  Date of discharge and from where: [facility] on 06/07/19  How have you been since you were released from the hospital? Doing better today. Oxygen level was 95 with oxygen around 1:00 PM today. This morning O2 was 85% without oxygen. Declined pain, fever, SOB, weakness or n/v/d. Any questions or concerns? Yes, pt was prescribed Prednisone prior to hospital admit and while in the hospital. Pt is not sure which Prednisone she should be taking (20 mg 2 tabs daily OR 50 mg once daily). Also pt was prescribed Doxycycline prior to hospital stay and pt would like to know if she should be taking that as well? Pt is requesting a portable oxygen tank that is in a bag to carry around. Also, pts Isosorbide was increased from 30 mg to 60 mg. Pt is currently taking two 30 mg tablets and would like a new prescription for 60 mg sent to [facility]. Please adivse, thank you.  Items Reviewed:  Did the pt receive and understand the discharge instructions provided? Yes   Medications obtained and verified? Yes   Any new allergies since your discharge? No   Dietary orders reviewed? Yes  Do you have support at home? Yes   Other (ie: DME, Home Health,/ etc) N/A  Functional Questionnaire: (I = Independent and D = Dependent)  Bathing/Dressing- I but has assistance with bathing.   Meal Prep- I  Eating- I  Maintaining continence- I but has occasional accidents. Wears depends daily.  Transferring/Ambulation- D, currently in a wheelchair.   Managing Meds- D, husband manages medications.   Follow up appointments reviewed:    PCP Hospital f/u appt confirmed? Yes , scheduled to see Dr Rosanna Randy on 06/13/19 @ 3:00 PM.  Specialist Hospital f/u appt confirmed? Yes    Are transportation arrangements needed? No   If their condition worsens, is the pt aware to call  their PCP or go to the ED? Yes  Was the patient provided with contact information for the PCP's office or  ED? Yes  Was the pt encouraged to call back with questions or concerns? Yes

## 2019-06-08 NOTE — Telephone Encounter (Signed)
Prednisone at 40 mg daily.  Okay to increase Imdur to 60 mg daily.  Consent Imdur to [facility].  Prednisone should be local prescription.  We will make decision about oxygen on the follow-up visit for transition care

## 2019-06-08 NOTE — Patient Instructions (Signed)
[name] [name] , Thank you for taking time to come for your Medicare Wellness Visit. I appreciate your ongoing commitment to your health goals. Please review the following plan we discussed and let me know if I can assist you in the future.   Screening recommendations/referrals: Colonoscopy: No longer required.  Mammogram: No longer required.  Bone Density: Up to date. Previous DEXA scan was normal. No repeat needed unless advised by a physician. Recommended yearly ophthalmology/optometry visit for glaucoma screening and checkup Recommended yearly dental visit for hygiene and checkup  Vaccinations: Influenza vaccine: Up to date Pneumococcal vaccine: Completed series Tdap vaccine: Up to date Shingles vaccine: Pt declines today.     Advanced directives: Please bring a copy of your POA (Power of Attorney) and/or Living Will to your next appointment.   Conditions/risks identified: Fall risk prevention discussed today. Recommend to increase water intake to 6-8 8 oz glasses a day.   Next appointment: 06/13/19 @ 3:00 PM with Dr Rosanna Randy   Preventive Care 50 Years and Older, Female Preventive care refers to lifestyle choices and visits with your health care provider that can promote health and wellness. What does preventive care include?  A yearly physical exam. This is also called an annual well check.  Dental exams once or twice a year.  Routine eye exams. Ask your health care provider how often you should have your eyes checked.  Personal lifestyle choices, including:  Daily care of your teeth and gums.  Regular physical activity.  Eating a healthy diet.  Avoiding tobacco and drug use.  Limiting alcohol use.  Practicing safe sex.  Taking low-dose aspirin every day.  Taking vitamin and mineral supplements as recommended by your health care provider. What happens during an annual well check? The services and screenings done by your health care provider during your annual well check  will depend on your age, overall health, lifestyle risk factors, and family history of disease. Counseling  Your health care provider may ask you questions about your:  Alcohol use.  Tobacco use.  Drug use.  Emotional well-being.  Home and relationship well-being.  Sexual activity.  Eating habits.  History of falls.  Memory and ability to understand (cognition).  Work and work Statistician.  Reproductive health. Screening  You may have the following tests or measurements:  Height, weight, and BMI.  Blood pressure.  Lipid and cholesterol levels. These may be checked every 5 years, or more frequently if you are over 2 years old.  Skin check.  Lung cancer screening. You may have this screening every year starting at age 68 if you have a 30-pack-year history of smoking and currently smoke or have quit within the past 15 years.  Fecal occult blood test (FOBT) of the stool. You may have this test every year starting at age 31.  Flexible sigmoidoscopy or colonoscopy. You may have a sigmoidoscopy every 5 years or a colonoscopy every 10 years starting at age 60.  Hepatitis C blood test.  Hepatitis B blood test.  Sexually transmitted disease (STD) testing.  Diabetes screening. This is done by checking your blood sugar (glucose) after you have not eaten for a while (fasting). You may have this done every 1-3 years.  Bone density scan. This is done to screen for osteoporosis. You may have this done starting at age 65.  Mammogram. This may be done every 1-2 years. Talk to your health care provider about how often you should have regular mammograms. Talk with your health care provider  about your test results, treatment options, and if necessary, the need for more tests. Vaccines  Your health care provider may recommend certain vaccines, such as:  Influenza vaccine. This is recommended every year.  Tetanus, diphtheria, and acellular pertussis (Tdap, Td) vaccine. You may  need a Td booster every 10 years.  Zoster vaccine. You may need this after age 1.  Pneumococcal 13-valent conjugate (PCV13) vaccine. One dose is recommended after age 14.  Pneumococcal polysaccharide (PPSV23) vaccine. One dose is recommended after age 13. Talk to your health care provider about which screenings and vaccines you need and how often you need them. This information is not intended to replace advice given to you by your health care provider. Make sure you discuss any questions you have with your health care provider. Document Released: 04/19/2015 Document Revised: 12/11/2015 Document Reviewed: 01/22/2015 Elsevier Interactive Patient Education  2017 [employer] Prevention in the Home Falls can cause injuries. They can happen to people of all ages. There are many things you can do to make your home safe and to help prevent falls. What can I do on the outside of my home?  Regularly fix the edges of walkways and driveways and fix any cracks.  Remove anything that might make you trip as you walk through a door, such as a raised step or threshold.  Trim any bushes or trees on the path to your home.  Use bright outdoor lighting.  Clear any walking paths of anything that might make someone trip, such as rocks or tools.  Regularly check to see if handrails are loose or broken. Make sure that both sides of any steps have handrails.  Any raised decks and porches should have guardrails on the edges.  Have any leaves, snow, or ice cleared regularly.  Use sand or salt on walking paths during winter.  Clean up any spills in your garage right away. This includes oil or grease spills. What can I do in the bathroom?  Use night lights.  Install grab bars by the toilet and in the tub and shower. Do not use towel bars as grab bars.  Use non-skid mats or decals in the tub or shower.  If you need to sit down in the shower, use a plastic, non-slip stool.  Keep the floor  dry. Clean up any water that spills on the floor as soon as it happens.  Remove soap buildup in the tub or shower regularly.  Attach bath mats securely with double-sided non-slip rug tape.  Do not have throw rugs and other things on the floor that can make you trip. What can I do in the bedroom?  Use night lights.  Make sure that you have a light by your bed that is easy to reach.  Do not use any sheets or blankets that are too big for your bed. They should not hang down onto the floor.  Have a firm chair that has side arms. You can use this for support while you get dressed.  Do not have throw rugs and other things on the floor that can make you trip. What can I do in the kitchen?  Clean up any spills right away.  Avoid walking on wet floors.  Keep items that you use a lot in easy-to-reach places.  If you need to reach something above you, use a strong step stool that has a grab bar.  Keep electrical cords out of the way.  Do not use floor polish or  wax that makes floors slippery. If you must use wax, use non-skid floor wax.  Do not have throw rugs and other things on the floor that can make you trip. What can I do with my stairs?  Do not leave any items on the stairs.  Make sure that there are handrails on both sides of the stairs and use them. Fix handrails that are broken or loose. Make sure that handrails are as long as the stairways.  Check any carpeting to make sure that it is firmly attached to the stairs. Fix any carpet that is loose or worn.  Avoid having throw rugs at the top or bottom of the stairs. If you do have throw rugs, attach them to the floor with carpet tape.  Make sure that you have a light switch at the top of the stairs and the bottom of the stairs. If you do not have them, ask someone to add them for you. What else can I do to help prevent falls?  Wear shoes that:  Do not have high heels.  Have rubber bottoms.  Are comfortable and fit you  well.  Are closed at the toe. Do not wear sandals.  If you use a stepladder:  Make sure that it is fully opened. Do not climb a closed stepladder.  Make sure that both sides of the stepladder are locked into place.  Ask someone to hold it for you, if possible.  Clearly mark and make sure that you can see:  Any grab bars or handrails.  First and last steps.  Where the edge of each step is.  Use tools that help you move around (mobility aids) if they are needed. These include:  Canes.  Walkers.  Scooters.  Crutches.  Turn on the lights when you go into a dark area. Replace any light bulbs as soon as they burn out.  Set up your furniture so you have a clear path. Avoid moving your furniture around.  If any of your floors are uneven, fix them.  If there are any pets around you, be aware of where they are.  Review your medicines with your doctor. Some medicines can make you feel dizzy. This can increase your chance of falling. Ask your doctor what other things that you can do to help prevent falls. This information is not intended to replace advice given to you by your health care provider. Make sure you discuss any questions you have with your health care provider. Document Released: 01/17/2009 Document Revised: 08/29/2015 Document Reviewed: 04/27/2014 Elsevier Interactive Patient Education  2017 [employer].

## 2019-06-08 NOTE — Telephone Encounter (Signed)
Please advise 

## 2019-06-09 ENCOUNTER — Telehealth: Payer: Self-pay | Admitting: Family Medicine

## 2019-06-09 NOTE — Telephone Encounter (Signed)
Patient contacted regarding discharge from [facility] on 06/07/19.  Patient understands to follow up with provider Dr. Rockey Situ on 06/21/2019 at 11:40 AM virtual visit. Patient understands discharge instructions? Yes Patient understands medications and regiment? Yes Patient understands to bring all medications to this visit? Yes  Patient and spouse confirmed virtual appointment through [place] at her upcoming visit. Both spouse and patient had no further questions at this time regarding discharge instructions or medications.

## 2019-06-09 NOTE — Telephone Encounter (Signed)
Returned call to patient's husband.

## 2019-06-09 NOTE — Telephone Encounter (Signed)
Pts husband called stating he was not sure which medications pt was supposed to take. He is requesting to have a call back as 2 steroids were sent in and an antibiotic. Please advise.    [id number] [place], [place] - [address]  [place] [address]  Phone: [phone] Fax: [phone]  Not a 24 hour pharmacy; exact hours not known.

## 2019-06-10 ENCOUNTER — Other Ambulatory Visit: Payer: Self-pay | Admitting: Family Medicine

## 2019-06-10 DIAGNOSIS — K2100 Gastro-esophageal reflux disease with esophagitis, without bleeding: Secondary | ICD-10-CM

## 2019-06-10 NOTE — Telephone Encounter (Signed)
Approved per protocol.  

## 2019-06-12 ENCOUNTER — Ambulatory Visit (INDEPENDENT_AMBULATORY_CARE_PROVIDER_SITE_OTHER): Payer: Medicare Other | Admitting: Vascular Surgery

## 2019-06-12 ENCOUNTER — Encounter (INDEPENDENT_AMBULATORY_CARE_PROVIDER_SITE_OTHER): Payer: Self-pay | Admitting: Vascular Surgery

## 2019-06-12 ENCOUNTER — Ambulatory Visit (INDEPENDENT_AMBULATORY_CARE_PROVIDER_SITE_OTHER): Payer: Medicare Other

## 2019-06-12 ENCOUNTER — Other Ambulatory Visit: Payer: Self-pay

## 2019-06-12 VITALS — BP 164/80 | HR 60 | Resp 10 | Ht 64.0 in | Wt 135.0 lb

## 2019-06-12 DIAGNOSIS — I10 Essential (primary) hypertension: Secondary | ICD-10-CM | POA: Diagnosis not present

## 2019-06-12 DIAGNOSIS — E782 Mixed hyperlipidemia: Secondary | ICD-10-CM

## 2019-06-12 DIAGNOSIS — I2510 Atherosclerotic heart disease of native coronary artery without angina pectoris: Secondary | ICD-10-CM | POA: Diagnosis not present

## 2019-06-12 DIAGNOSIS — J432 Centrilobular emphysema: Secondary | ICD-10-CM | POA: Diagnosis not present

## 2019-06-12 DIAGNOSIS — I6523 Occlusion and stenosis of bilateral carotid arteries: Secondary | ICD-10-CM

## 2019-06-13 ENCOUNTER — Inpatient Hospital Stay: Payer: Medicare Other | Admitting: Family Medicine

## 2019-06-13 NOTE — Progress Notes (Signed)
0.      Patient: [name] [name] Female    DOB: 1940-07-16   79 y.o.   MRN: [id number] Visit Date: 06/14/2019  Today's Provider: [name], MD   Chief Complaint  Patient presents with  . Hospitalization Follow-up   Subjective:     HPI    Follow up Hospitalization Transition of care visit for COPD exacerbation and hypoxia and weakness.  She has chronic anemia that is mild and mild stage III CKD Patient was admitted to [facility] on 06/06/2019 and discharged on 06/07/2019. She was treated for Shortness of Breath and Weakness. Treatment for this included see note in chart. Telephone follow up was done on 06/08/2019 She reports good compliance with treatment. She reports this condition is Improved.  -----------------------------------------------------------  Patient states she is feeling better since hospital discharge. Patient states she has bad days and good days. Husband brings her in today and admits that she is having progressive problems with cognitive impairment.  No behavioral issues. Allergies  Allergen Reactions  . Coconut Fatty Acids Swelling and Other (See Comments)    Throat swells     Current Outpatient Medications:  .  albuterol (VENTOLIN HFA) 108 (90 Base) MCG/ACT inhaler, Inhale 2 puffs into the lungs every 4 (four) hours as needed for wheezing or shortness of breath., Disp: 8 g, Rfl: 11 .  aspirin EC 81 MG EC tablet, Take 1 tablet (81 mg total) by mouth daily., Disp: 30 tablet, Rfl: 0 .  atorvastatin (LIPITOR) 40 MG tablet, Take 1 tablet (40 mg total) by mouth daily at 6 PM., Disp: 90 tablet, Rfl: 3 .  blood glucose meter kit and supplies, Dispense based on patient and insurance preference. Use once daily as directed. (FOR ICD-10  E11.42)., Disp: 1 each, Rfl: 0 .  clopidogrel (PLAVIX) 75 MG tablet, TAKE 1 TABLET BY MOUTH EVERY DAY (Patient taking differently: Take 75 mg by mouth daily. ), Disp: 90 tablet, Rfl: 1 .  fluticasone (FLONASE) 50 MCG/ACT nasal  spray, Place 2 sprays into both nostrils daily. (Patient taking differently: Place 2 sprays into both nostrils daily as needed for allergies. ), Disp: 16 g, Rfl: 6 .  gabapentin (NEURONTIN) 600 MG tablet, TAKE ONE-HALF TABLET BY  MOUTH TWO TIMES DAILY (Patient taking differently: Take 300 mg by mouth 2 (two) times daily. ), Disp: 90 tablet, Rfl: 3 .  glimepiride (AMARYL) 2 MG tablet, TAKE 1 TABLET BY MOUTH TWO  TIMES DAILY, Disp: 180 tablet, Rfl: 3 .  glucose blood (ONETOUCH ULTRA) test strip, USE TO CHECK BLOOD SUGAR ONCE DAILY, Disp: 100 strip, Rfl: 3 .  ipratropium-albuterol (DUONEB) 0.5-2.5 (3) MG/3ML SOLN, Take 3 mLs by nebulization every 4 (four) hours as needed., Disp: 360 mL, Rfl: 1 .  isosorbide mononitrate (IMDUR) 60 MG 24 hr tablet, Take 1 tablet (60 mg total) by mouth daily., Disp: 90 tablet, Rfl: 3 .  LORazepam (ATIVAN) 1 MG tablet, Take 1 tablet (1 mg total) by mouth 2 (two) times daily as needed for anxiety., Disp: 30 tablet, Rfl: 5 .  magnesium oxide (MAG-OX) 400 (241.3 MG) MG tablet, Take 400 mg by mouth 2 (two) times daily. , Disp: , Rfl:  .  metFORMIN (GLUCOPHAGE) 500 MG tablet, Take 1 tablet (500 mg total) by mouth 2 (two) times daily with a meal. (Patient taking differently: Take 500 mg by mouth daily with breakfast. Can only tolerate once daily with meal), Disp: 180 tablet, Rfl: 3 .  metoprolol succinate (TOPROL-XL) 100 MG  24 hr tablet, Take 1 tablet (100 mg total) by mouth daily., Disp: 90 tablet, Rfl: 3 .  mometasone (ELOCON) 0.1 % lotion, Apply 4 drops topically at bedtime as needed (dry skin). , Disp: , Rfl:  .  Multiple Vitamin (MULTIVITAMIN) tablet, Take 0.5 tablets by mouth 2 (two) times daily., Disp: , Rfl:  .  Multiple Vitamins-Minerals (ICAPS) CAPS, Take 1 capsule by mouth 2 (two) times daily. , Disp: , Rfl:  .  nitroGLYCERIN (NITROSTAT) 0.4 MG SL tablet, Place 1 tablet (0.4 mg total) under the tongue every 5 (five) minutes as needed for chest pain., Disp: 25 tablet, Rfl:  3 .  sucralfate (CARAFATE) 1 g tablet, Take 1 tablet (1 g total) by mouth 2 (two) times daily., Disp: 180 tablet, Rfl: 3 .  VITAMIN D PO, Take by mouth., Disp: , Rfl:  .  vitamin E (VITAMIN E) 400 UNIT capsule, Take 400 Units by mouth daily., Disp: , Rfl:  .  amLODipine (NORVASC) 5 MG tablet, Take 1 tablet (5 mg total) by mouth daily., Disp: 30 tablet, Rfl: 2 .  donepezil (ARICEPT) 5 MG tablet, Take 1 tablet (5 mg total) by mouth at bedtime., Disp: 30 tablet, Rfl: 2 .  pantoprazole (PROTONIX) 40 MG tablet, TAKE 1 TABLET BY MOUTH  DAILY (Patient not taking: Reported on 06/14/2019), Disp: 90 tablet, Rfl: 1 No current facility-administered medications for this visit.  Facility-Administered Medications Ordered in Other Visits:  .  ondansetron (ZOFRAN) 8 mg, dexamethasone (DECADRON) 10 mg in sodium chloride 0.9 % 50 mL IVPB, , Intravenous, Once, Finnegan, Kathlene November, MD  Review of Systems  Constitutional: Positive for fatigue. Negative for appetite change, chills and fever.  HENT: Negative.   Eyes: Negative.   Respiratory: Negative for chest tightness and shortness of breath.   Cardiovascular: Negative for chest pain and palpitations.  Gastrointestinal: Negative for abdominal pain, nausea and vomiting.  Endocrine: Negative.   Musculoskeletal: Positive for arthralgias.  Allergic/Immunologic: Negative.   Neurological: Positive for weakness. Negative for dizziness.       Cognitive problems.  Hematological: Negative.   Psychiatric/Behavioral: Negative.     Social History   Tobacco Use  . Smoking status: Former Smoker    Packs/day: 1.00    Years: 50.00    Pack years: 50.00    Types: Cigarettes    Quit date: 10/03/2015    Years since quitting: 3.6  . Smokeless tobacco: Never Used  . Tobacco comment: smokes 3 cigs daily 05/06/15. Pt instructed to quit.  Substance Use Topics  . Alcohol use: No    Alcohol/week: 0.0 standard drinks      Objective:   BP 130/72 (BP Location: Left Arm,  Patient Position: Sitting, Cuff Size: Normal)   Pulse (!) 59   Temp (!) 96.6 F (35.9 C) (Other (Comment))   Resp 18   SpO2 94%  Vitals:   06/14/19 1354  BP: 130/72  Pulse: (!) 59  Resp: 18  Temp: (!) 96.6 F (35.9 C)  TempSrc: Other (Comment)  SpO2: 94%  There is no height or weight on file to calculate BMI.   Physical Exam Vitals reviewed.  Constitutional:      Comments: She is in a wheelchair.  She appears chronically ill.  HENT:     Head: Normocephalic and atraumatic.     Right Ear: External ear normal.     Left Ear: External ear normal.  Eyes:     General: No scleral icterus.    Conjunctiva/sclera: Conjunctivae normal.  Cardiovascular:     Rate and Rhythm: Normal rate and regular rhythm.     Heart sounds: Normal heart sounds.  Pulmonary:     Breath sounds: Normal breath sounds.  Abdominal:     Palpations: Abdomen is soft.  Skin:    General: Skin is warm and dry.     Comments: Fair, dry skin.  Neurological:     General: No focal deficit present.     Mental Status: She is alert.  Psychiatric:        Mood and Affect: Mood normal.        Behavior: Behavior normal.        Thought Content: Thought content normal.      No results found for any visits on 06/14/19.     Assessment & Plan    1. Essential hypertension, benign  - amLODipine (NORVASC) 5 MG tablet; Take 1 tablet (5 mg total) by mouth daily.  Dispense: 30 tablet; Refill: 2 - CBC w/Diff/Platelet - Renal function panel - Hemoglobin A1C  2. Diabetic polyneuropathy associated with type 2 diabetes mellitus (HCC)  - CBC w/Diff/Platelet - Renal function panel - Hemoglobin A1C  3. Centrilobular emphysema (HCC) Recent COPD exacerbation.  She is back to her baseline.  O2 sat is 94% today.  I did not walk her.  She has oxygen at home for nighttime use and for as needed use. - CBC w/Diff/Platelet - Renal function panel - Hemoglobin A1C  4. Malignant neoplasm of upper lobe of right lung (HCC)  -  CBC w/Diff/Platelet - Renal function panel - Hemoglobin A1C  5. Anemia, unspecified type  - CBC w/Diff/Platelet - Renal function panel - Hemoglobin A1C  6. MCI (mild cognitive impairment) Start donepezil 5 mg daily. MMSE obtained today, score was 21. Referred to Home Health to help with bathing and personal care.  I am not sure how much longer the patient will be able to stay at home with her husband who is her caregiver at this point time.  - donepezil (ARICEPT) 5 MG tablet; Take 1 tablet (5 mg total) by mouth at bedtime.  Dispense: 30 tablet; Refill: 2 - CBC w/Diff/Platelet - Renal function panel - Hemoglobin A1C - Ambulatory referral to [place], MD  [facility] +

## 2019-06-14 ENCOUNTER — Encounter: Payer: Self-pay | Admitting: Family Medicine

## 2019-06-14 ENCOUNTER — Ambulatory Visit (INDEPENDENT_AMBULATORY_CARE_PROVIDER_SITE_OTHER): Payer: Medicare Other | Admitting: Family Medicine

## 2019-06-14 ENCOUNTER — Other Ambulatory Visit: Payer: Self-pay

## 2019-06-14 ENCOUNTER — Encounter (INDEPENDENT_AMBULATORY_CARE_PROVIDER_SITE_OTHER): Payer: Self-pay | Admitting: Vascular Surgery

## 2019-06-14 VITALS — BP 130/72 | HR 59 | Temp 96.6°F | Resp 18

## 2019-06-14 DIAGNOSIS — D649 Anemia, unspecified: Secondary | ICD-10-CM | POA: Diagnosis not present

## 2019-06-14 DIAGNOSIS — I10 Essential (primary) hypertension: Secondary | ICD-10-CM | POA: Diagnosis not present

## 2019-06-14 DIAGNOSIS — J432 Centrilobular emphysema: Secondary | ICD-10-CM | POA: Diagnosis not present

## 2019-06-14 DIAGNOSIS — C3411 Malignant neoplasm of upper lobe, right bronchus or lung: Secondary | ICD-10-CM | POA: Diagnosis not present

## 2019-06-14 DIAGNOSIS — G3184 Mild cognitive impairment, so stated: Secondary | ICD-10-CM

## 2019-06-14 DIAGNOSIS — E1142 Type 2 diabetes mellitus with diabetic polyneuropathy: Secondary | ICD-10-CM

## 2019-06-14 MED ORDER — DONEPEZIL HCL 5 MG PO TABS: 5.0000 mg | ORAL_TABLET | Freq: Every day | ORAL | 2 refills | Status: DC

## 2019-06-14 MED ORDER — AMLODIPINE BESYLATE 5 MG PO TABS: 5.0000 mg | ORAL_TABLET | Freq: Every day | ORAL | 2 refills | Status: DC

## 2019-06-14 NOTE — Progress Notes (Signed)
MRN : [id number]  [name] [name] is a 79 y.o. (07-11-1940) female who presents with chief complaint of  Chief Complaint  Patient presents with  . Follow-up    Carotid 1year  .  History of Present Illness:   The patient is seen for follow up evaluation of carotid stenosis. The carotid stenosis followed by ultrasound.  She is status post left carotid endarterectomy July 2016.  The patient denies amaurosis fugax. There is no recent history of TIA symptoms or focal motor deficits. There is no prior documented CVA.  The patient is taking enteric-coated aspirin 81 mg daily.  There is no history of migraine headaches. There is no history of seizures.  The patient has a history of coronary artery disease, no recent episodes of angina or shortness of breath. The patient denies PAD or claudication symptoms. There is a history of hyperlipidemia which is being treated with a statin.    Carotid Duplex done today shows less than 40% right internal carotid artery stenosis and less than 40% left internal carotid artery stenosis status post endarterectomy mild intimal thickening is noted.Marland Kitchen  No change compared to last study.  Current Meds  Medication Sig  . albuterol (VENTOLIN HFA) 108 (90 Base) MCG/ACT inhaler Inhale 2 puffs into the lungs every 4 (four) hours as needed for wheezing or shortness of breath.  Marland Kitchen amLODipine (NORVASC) 5 MG tablet Take 5 mg by mouth daily.   Marland Kitchen aspirin EC 81 MG EC tablet Take 1 tablet (81 mg total) by mouth daily.  Marland Kitchen atorvastatin (LIPITOR) 40 MG tablet Take 1 tablet (40 mg total) by mouth daily at 6 PM.  . blood glucose meter kit and supplies Dispense based on patient and insurance preference. Use once daily as directed. (FOR ICD-10  E11.42).  Marland Kitchen clopidogrel (PLAVIX) 75 MG tablet TAKE 1 TABLET BY MOUTH EVERY DAY (Patient taking differently: Take 75 mg by mouth daily. )  . [EXPIRED] doxycycline (VIBRA-TABS) 100 MG tablet Take 1 tablet (100 mg total) by mouth 2 (two)  times daily for 7 days.  . fluticasone (FLONASE) 50 MCG/ACT nasal spray Place 2 sprays into both nostrils daily. (Patient taking differently: Place 2 sprays into both nostrils daily as needed for allergies. )  . gabapentin (NEURONTIN) 600 MG tablet TAKE ONE-HALF TABLET BY  MOUTH TWO TIMES DAILY (Patient taking differently: Take 300 mg by mouth 2 (two) times daily. )  . glimepiride (AMARYL) 2 MG tablet TAKE 1 TABLET BY MOUTH TWO  TIMES DAILY  . glucose blood (ONETOUCH ULTRA) test strip USE TO CHECK BLOOD SUGAR ONCE DAILY  . ipratropium-albuterol (DUONEB) 0.5-2.5 (3) MG/3ML SOLN Take 3 mLs by nebulization every 4 (four) hours as needed.  . isosorbide mononitrate (IMDUR) 60 MG 24 hr tablet Take 1 tablet (60 mg total) by mouth daily.  Marland Kitchen LORazepam (ATIVAN) 1 MG tablet Take 1 tablet (1 mg total) by mouth 2 (two) times daily as needed for anxiety.  . magnesium oxide (MAG-OX) 400 (241.3 MG) MG tablet Take 400 mg by mouth 2 (two) times daily.   . metFORMIN (GLUCOPHAGE) 500 MG tablet Take 1 tablet (500 mg total) by mouth 2 (two) times daily with a meal. (Patient taking differently: Take 500 mg by mouth daily with breakfast. Can only tolerate once daily with meal)  . metoprolol succinate (TOPROL-XL) 100 MG 24 hr tablet Take 1 tablet (100 mg total) by mouth daily.  . mometasone (ELOCON) 0.1 % lotion Apply 4 drops topically at bedtime as needed (  dry skin).   . Multiple Vitamin (MULTIVITAMIN) tablet Take 0.5 tablets by mouth 2 (two) times daily.  . Multiple Vitamins-Minerals (ICAPS) CAPS Take 1 capsule by mouth daily.   . nitroGLYCERIN (NITROSTAT) 0.4 MG SL tablet Place 1 tablet (0.4 mg total) under the tongue every 5 (five) minutes as needed for chest pain.  . pantoprazole (PROTONIX) 40 MG tablet TAKE 1 TABLET BY MOUTH  DAILY  . sucralfate (CARAFATE) 1 g tablet Take 1 tablet (1 g total) by mouth 2 (two) times daily.  . vitamin E (VITAMIN E) 400 UNIT capsule Take 400 Units by mouth daily.    Past Medical  History:  Diagnosis Date  . Abnormal CT lung screening 10/17/2015  . COPD (chronic obstructive pulmonary disease) ([place])   . Coronary artery disease, non-occlusive    a. cath 2006: min nonobs CAD; b. cath 12/2010: cath LAD 50%, RCA 60%; c. 08/2013: Minimal luminal irregs, right dominant system with no significant CAD, diffuse luminal irregs noted. Normal EF 55%, no AS or MS.   . Diabetes mellitus   . Hyperlipemia    Followed by Dr. Rosanna Randy  . Hypertension   . Lung cancer ([place])   . Macular degeneration    rt  . Personal history of tobacco use, presenting hazards to health 10/15/2015  . Stroke [facility])     Past Surgical History:  Procedure Laterality Date  . ABDOMINAL HYSTERECTOMY    . ANTERIOR CERVICAL DECOMP/DISCECTOMY FUSION N/A 10/19/2013   Procedure: CERVICAL FIVE-SIX ANTERIOR CERVICAL DECOMPRESSION WITH FUSION INTERBODY PROSTHESIS PLATING AND PEEK CAGE;  Surgeon: Ophelia Charter, MD;  Location: [place] [facility];  Service: Neurosurgery;  Laterality: N/A;  . BACK SURGERY  80's  . BREAST CYST EXCISION Left    left negative   . CARDIAC CATHETERIZATION  05/2004  . CATARACT EXTRACTION Left   . CHOLECYSTECTOMY    . COLONOSCOPY N/A 12/29/2018   Procedure: COLONOSCOPY;  Surgeon: Toledo, Benay Pike, MD;  Location: [facility] ENDOSCOPY;  Service: Gastroenterology;  Laterality: N/A;  . ENDARTERECTOMY Left 10/24/2014   Procedure: ENDARTERECTOMY CAROTID;  Surgeon: Algernon Huxley, MD;  Location: [facility] ORS;  Service: Vascular;  Laterality: Left;  . ENDOBRONCHIAL ULTRASOUND N/A 11/14/2015   Procedure: ENDOBRONCHIAL ULTRASOUND;  Surgeon: [name], MD;  Location: [facility] ORS;  Service: Cardiopulmonary;  Laterality: N/A;  . PERIPHERAL VASCULAR CATHETERIZATION N/A 12/04/2015   Procedure: Glori Luis Cath Insertion;  Surgeon: Algernon Huxley, MD;  Location: [place] CV LAB;  Service: Cardiovascular;  Laterality: N/A;  . PORTA CATH REMOVAL N/A 05/17/2017   Procedure: PORTA CATH REMOVAL;  Surgeon: Algernon Huxley, MD;  Location: [place] CV LAB;  Service: Cardiovascular;  Laterality: N/A;  . TONSILLECTOMY AND ADENOIDECTOMY    . VESICOVAGINAL FISTULA CLOSURE W/ TAH      Social History Social History   Tobacco Use  . Smoking status: Former Smoker    Packs/day: 1.00    Years: 50.00    Pack years: 50.00    Types: Cigarettes    Quit date: 10/03/2015    Years since quitting: 3.6  . Smokeless tobacco: Never Used  . Tobacco comment: smokes 3 cigs daily 05/06/15. Pt instructed to quit.  Substance Use Topics  . Alcohol use: No    Alcohol/week: 0.0 standard drinks  . Drug use: No    Family History Family History  Problem Relation Age of Onset  . Stroke Mother   . Heart attack Father   . Heart attack Brother   . Heart  attack Brother   . Lung cancer Maternal Grandfather   . Heart attack Paternal Grandmother        MI    Allergies  Allergen Reactions  . Coconut Fatty Acids Swelling and Other (See Comments)    Throat swells     REVIEW OF SYSTEMS (Negative unless checked)  Constitutional: '[]' Weight loss  '[]' Fever  '[]' Chills Cardiac: '[]' Chest pain   '[]' Chest pressure   '[]' Palpitations   '[]' Shortness of breath when laying flat   '[]' Shortness of breath with exertion. Vascular:  '[]' Pain in legs with walking   '[]' Pain in legs at rest  '[]' History of DVT   '[]' Phlebitis   '[]' Swelling in legs   '[]' Varicose veins   '[]' Non-healing ulcers Pulmonary:   '[]' Uses home oxygen   '[]' Productive cough   '[]' Hemoptysis   '[]' Wheeze  '[]' COPD   '[]' Asthma Neurologic:  '[]' Dizziness   '[]' Seizures   '[]' History of stroke   '[]' History of TIA  '[]' Aphasia   '[]' Vissual changes   '[]' Weakness or numbness in arm   '[]' Weakness or numbness in leg Musculoskeletal:   '[]' Joint swelling   '[x]' Joint pain   '[]' Low back pain Hematologic:  '[]' Easy bruising  '[]' Easy bleeding   '[]' Hypercoagulable state   '[]' Anemic Gastrointestinal:  '[]' Diarrhea   '[]' Vomiting  '[]' Gastroesophageal reflux/heartburn   '[]' Difficulty swallowing. Genitourinary:  '[]' Chronic kidney disease   '[]' Difficult urination   '[]' Frequent urination   '[]' Blood in urine Skin:  '[]' Rashes   '[]' Ulcers  Psychological:  '[]' History of anxiety   '[]'  History of major depression.  Physical Examination  Vitals:   06/12/19 1443  BP: (!) 164/80  Pulse: 60  Resp: 10  Weight: 135 lb (61.2 kg)  Height: '5\' 4"'  (1.626 m)   Body mass index is 23.17 kg/m. Gen: WD/WN, NAD Head: [place]/AT, No temporalis wasting.  Ear/Nose/Throat: Hearing grossly intact, nares w/o erythema or drainage Eyes: PER, EOMI, sclera nonicteric.  Neck: Supple, no large masses.   Pulmonary:  Good air movement, no audible wheezing bilaterally, no use of accessory muscles.  Cardiac: RRR, no JVD Vascular: Well-healed left CEA incisional scar Vessel Right Left  Radial Palpable Palpable  Brachial Palpable Palpable  Carotid Palpable Palpable  Gastrointestinal: Non-distended. No guarding/no peritoneal signs.  Musculoskeletal: M/S 5/5 throughout.  No deformity or atrophy.  Neurologic: CN 2-12 intact. Symmetrical.  Speech is fluent. Motor exam as listed above. Psychiatric: Judgment intact, Mood & affect appropriate for pt's clinical situation. Dermatologic: No rashes or ulcers noted.  No changes consistent with cellulitis. Lymph : No lichenification or skin changes of chronic lymphedema.  CBC Lab Results  Component Value Date   WBC 10.9 (H) 06/07/2019   HGB 10.3 (L) 06/07/2019   HCT 32.8 (L) 06/07/2019   MCV 97.6 06/07/2019   PLT 229 06/07/2019    BMET    Component Value Date/Time   NA 138 06/07/2019 0141   NA 142 09/21/2017 1422   K 4.3 06/07/2019 0141   K 3.9 12/16/2011 1232   CL 99 06/07/2019 0141   CO2 25 06/07/2019 0141   GLUCOSE 254 (H) 06/07/2019 0141   BUN 23 06/07/2019 0141   BUN 22 09/21/2017 1422   CREATININE 1.36 (H) 06/07/2019 0141   CALCIUM 8.8 (L) 06/07/2019 0141   GFRNONAA 37 (L) 06/07/2019 0141   GFRAA 43 (L) 06/07/2019 0141   Estimated Creatinine Clearance: 29.9 mL/min (A) (by C-G formula based on SCr of 1.36 mg/dL  (H)).  COAG Lab Results  Component Value Date   INR 0.9 12/27/2018   INR 0.89 01/27/2015  INR 0.95 10/17/2014    Radiology NM Pulmonary Perfusion  Result Date: 06/07/2019 CLINICAL DATA:  Shortness of breath, chest pain EXAM: NUCLEAR MEDICINE PERFUSION LUNG SCAN TECHNIQUE: Perfusion images were obtained in multiple projections after intravenous injection of radiopharmaceutical. Ventilation scans intentionally deferred if perfusion scan and chest x-ray adequate for interpretation during COVID 19 epidemic. RADIOPHARMACEUTICALS:  1.7 mCi Tc-51mMAA IV COMPARISON:  Chest x-ray 06/06/2019 FINDINGS: There are no perfusion defects noted. IMPRESSION: No evidence of perfusion defect to suggest pulmonary embolus. Electronically Signed   By: KRolm BaptiseM.D.   On: 06/07/2019 11:32   DG Chest Portable 1 View  Result Date: 06/06/2019 CLINICAL DATA:  Shortness of breath and chest pain EXAM: PORTABLE CHEST 1 VIEW COMPARISON:  09/22/2018 FINDINGS: Hyperinflation with emphysematous disease. No consolidation or effusion. Enlarged appearing central pulmonary arteries. No pneumothorax. Stable cardiomediastinal silhouette. IMPRESSION: No active disease. Electronically Signed   By: KDonavan FoilM.D.   On: 06/06/2019 18:21   VAS [place]CAROTID  Result Date: 06/12/2019 Carotid Arterial Duplex Study Indications:   Carotid artery disease. Other Factors: Left carotid endarterectomy on 10/24/14. Performing Technologist: WBlondell RevealRT, RDMS, RVT  Examination Guidelines: A complete evaluation includes B-mode imaging, spectral Doppler, color Doppler, and power Doppler as needed of all accessible portions of each vessel. Bilateral testing is considered an integral part of a complete examination. Limited examinations for reoccurring indications may be performed as noted.  Right Carotid Findings: +----------+--------+--------+--------+------------------+--------+           PSV cm/sEDV cm/sStenosisPlaque DescriptionComments  +----------+--------+--------+--------+------------------+--------+ CCA Prox  56      0                                          +----------+--------+--------+--------+------------------+--------+ CCA Mid   57      5                                          +----------+--------+--------+--------+------------------+--------+ CCA Distal44      0               heterogenous               +----------+--------+--------+--------+------------------+--------+ ICA Prox  56      11      1-39%   calcific                   +----------+--------+--------+--------+------------------+--------+ ICA Mid   59      14                                         +----------+--------+--------+--------+------------------+--------+ ICA Distal47      12                                         +----------+--------+--------+--------+------------------+--------+ ECA       77                                                 +----------+--------+--------+--------+------------------+--------+ +----------+--------+-------+----------------+-------------------+  PSV cm/sEDV cmsDescribe        Arm Pressure (mmHG) +----------+--------+-------+----------------+-------------------+ [id number]             Multiphasic, WNL                    +----------+--------+-------+----------------+-------------------+ +---------+--------+--+--------+---------+ VertebralPSV cm/s46EDV cm/sAntegrade +---------+--------+--+--------+---------+  Left Carotid Findings: +----------+--------+--------+--------+------------------+------------------+           PSV cm/sEDV cm/sStenosisPlaque DescriptionComments           +----------+--------+--------+--------+------------------+------------------+ CCA Prox  61      0                                                    +----------+--------+--------+--------+------------------+------------------+ CCA Mid   50      7                                  intimal thickening +----------+--------+--------+--------+------------------+------------------+ CCA Distal60      0                                 intimal thickening +----------+--------+--------+--------+------------------+------------------+ ICA Prox  93      20                                intimal thickening +----------+--------+--------+--------+------------------+------------------+ ICA Mid   107     20                                                   +----------+--------+--------+--------+------------------+------------------+ ICA Distal69      17                                                   +----------+--------+--------+--------+------------------+------------------+ ECA       130                     hypoechoic                           +----------+--------+--------+--------+------------------+------------------+ +----------+--------+--------+----------------+-------------------+           PSV cm/sEDV cm/sDescribe        Arm Pressure (mmHG) +----------+--------+--------+----------------+-------------------+ Subclavian120             Multiphasic, WNL                    +----------+--------+--------+----------------+-------------------+ +---------+--------+--+--------+---------+ VertebralPSV cm/s52EDV cm/sAntegrade +---------+--------+--+--------+---------+  Summary: Right Carotid: Velocities in the right ICA are consistent with a 1-39% stenosis.                The extracranial vessels were near-normal with only minimal wall                thickening or plaque. Left Carotid: The extracranial vessels were near-normal with only minimal wall  thickening or plaque.                No significant change in velocities when compared to the previous               exam on 05/06/[facility] at [facility].  *See table(s) above for measurements and observations.  Electronically signed by Hortencia Pilar MD on 06/12/2019 at 5:15:29 PM.    Final       Assessment/Plan 1. Bilateral carotid artery stenosis Recommend:  Given the patient's asymptomatic subcritical stenosis no further invasive testing or surgery at this time.  Duplex ultrasound shows <40% stenosis bilaterally.  Continue antiplatelet therapy as prescribed Continue management of CAD, HTN and Hyperlipidemia Healthy heart diet,  encouraged exercise at least 4 times per week.  Follow up in 12 months with duplex ultrasound and physical exam  - VAS US CAROTID  2. CAD in native artery Continue cardiac and antihypertensive medications as already ordered and reviewed, no changes at this time.  Continue statin as ordered and reviewed, no changes at this time  Nitrates PRN for chest pain   3. Essential (primary) hypertension Continue antihypertensive medications as already ordered, these medications have been reviewed and there are no changes at this time.   4. Centrilobular emphysema ([place]) Continue pulmonary medications and aerosols as already ordered, these medications have been reviewed and there are no changes at this time.   5. Mixed hyperlipidemia Continue statin as ordered and reviewed, no changes at this time    Hortencia Pilar, MD  06/14/2019 12:21 PM

## 2019-06-15 ENCOUNTER — Telehealth: Payer: Self-pay | Admitting: Family Medicine

## 2019-06-15 DIAGNOSIS — E785 Hyperlipidemia, unspecified: Secondary | ICD-10-CM | POA: Diagnosis not present

## 2019-06-15 DIAGNOSIS — Z7902 Long term (current) use of antithrombotics/antiplatelets: Secondary | ICD-10-CM | POA: Diagnosis not present

## 2019-06-15 DIAGNOSIS — E43 Unspecified severe protein-calorie malnutrition: Secondary | ICD-10-CM | POA: Diagnosis not present

## 2019-06-15 DIAGNOSIS — I6529 Occlusion and stenosis of unspecified carotid artery: Secondary | ICD-10-CM | POA: Diagnosis not present

## 2019-06-15 DIAGNOSIS — I2510 Atherosclerotic heart disease of native coronary artery without angina pectoris: Secondary | ICD-10-CM | POA: Diagnosis not present

## 2019-06-15 DIAGNOSIS — I110 Hypertensive heart disease with heart failure: Secondary | ICD-10-CM | POA: Diagnosis not present

## 2019-06-15 DIAGNOSIS — I509 Heart failure, unspecified: Secondary | ICD-10-CM | POA: Diagnosis not present

## 2019-06-15 DIAGNOSIS — K5900 Constipation, unspecified: Secondary | ICD-10-CM | POA: Diagnosis not present

## 2019-06-15 DIAGNOSIS — Z9181 History of falling: Secondary | ICD-10-CM | POA: Diagnosis not present

## 2019-06-15 DIAGNOSIS — E538 Deficiency of other specified B group vitamins: Secondary | ICD-10-CM | POA: Diagnosis not present

## 2019-06-15 DIAGNOSIS — E1142 Type 2 diabetes mellitus with diabetic polyneuropathy: Secondary | ICD-10-CM | POA: Diagnosis not present

## 2019-06-15 DIAGNOSIS — Z7982 Long term (current) use of aspirin: Secondary | ICD-10-CM | POA: Diagnosis not present

## 2019-06-15 DIAGNOSIS — Z8673 Personal history of transient ischemic attack (TIA), and cerebral infarction without residual deficits: Secondary | ICD-10-CM | POA: Diagnosis not present

## 2019-06-15 DIAGNOSIS — Z87891 Personal history of nicotine dependence: Secondary | ICD-10-CM | POA: Diagnosis not present

## 2019-06-15 DIAGNOSIS — C3411 Malignant neoplasm of upper lobe, right bronchus or lung: Secondary | ICD-10-CM | POA: Diagnosis not present

## 2019-06-15 DIAGNOSIS — J449 Chronic obstructive pulmonary disease, unspecified: Secondary | ICD-10-CM | POA: Diagnosis not present

## 2019-06-15 DIAGNOSIS — M109 Gout, unspecified: Secondary | ICD-10-CM | POA: Diagnosis not present

## 2019-06-15 DIAGNOSIS — E1151 Type 2 diabetes mellitus with diabetic peripheral angiopathy without gangrene: Secondary | ICD-10-CM | POA: Diagnosis not present

## 2019-06-15 DIAGNOSIS — Z7984 Long term (current) use of oral hypoglycemic drugs: Secondary | ICD-10-CM | POA: Diagnosis not present

## 2019-06-15 DIAGNOSIS — Z9981 Dependence on supplemental oxygen: Secondary | ICD-10-CM | POA: Diagnosis not present

## 2019-06-15 DIAGNOSIS — M4722 Other spondylosis with radiculopathy, cervical region: Secondary | ICD-10-CM | POA: Diagnosis not present

## 2019-06-15 LAB — CBC WITH DIFFERENTIAL/PLATELET
Basophils Absolute: 0 10*3/uL (ref 0.0–0.2)
Basos: 0 %
EOS (ABSOLUTE): 0.1 10*3/uL (ref 0.0–0.4)
Eos: 1 %
Hematocrit: 32.4 % — ABNORMAL LOW (ref 34.0–46.6)
Hemoglobin: 10.4 g/dL — ABNORMAL LOW (ref 11.1–15.9)
Immature Grans (Abs): 0.1 10*3/uL (ref 0.0–0.1)
Immature Granulocytes: 1 %
Lymphocytes Absolute: 1.1 10*3/uL (ref 0.7–3.1)
Lymphs: 10 %
MCH: 30.4 pg (ref 26.6–33.0)
MCHC: 32.1 g/dL (ref 31.5–35.7)
MCV: 95 fL (ref 79–97)
Monocytes Absolute: 1 10*3/uL — ABNORMAL HIGH (ref 0.1–0.9)
Monocytes: 9 %
Neutrophils Absolute: 8.4 10*3/uL — ABNORMAL HIGH (ref 1.4–7.0)
Neutrophils: 79 %
Platelets: 283 10*3/uL (ref 150–450)
RBC: 3.42 x10E6/uL — ABNORMAL LOW (ref 3.77–5.28)
RDW: 15.2 % (ref 11.7–15.4)
WBC: 10.6 10*3/uL (ref 3.4–10.8)

## 2019-06-15 LAB — RENAL FUNCTION PANEL
Albumin: 4.2 g/dL (ref 3.7–4.7)
BUN/Creatinine Ratio: 25 (ref 12–28)
BUN: 44 mg/dL — ABNORMAL HIGH (ref 8–27)
CO2: 25 mmol/L (ref 20–29)
Calcium: 8.9 mg/dL (ref 8.7–10.3)
Chloride: 99 mmol/L (ref 96–106)
Creatinine, Ser: 1.79 mg/dL — ABNORMAL HIGH (ref 0.57–1.00)
GFR calc Af Amer: 31 mL/min/{1.73_m2} — ABNORMAL LOW (ref >59)
GFR calc non Af Amer: 27 mL/min/{1.73_m2} — ABNORMAL LOW (ref >59)
Glucose: 281 mg/dL — ABNORMAL HIGH (ref 65–99)
Phosphorus: 3.9 mg/dL (ref 3.0–4.3)
Potassium: 4.4 mmol/L (ref 3.5–5.2)
Sodium: 140 mmol/L (ref 134–144)

## 2019-06-15 LAB — HEMOGLOBIN A1C
Est. average glucose Bld gHb Est-mCnc: 134 mg/dL
Hgb A1c MFr Bld: 6.3 % — ABNORMAL HIGH (ref 4.8–5.6)

## 2019-06-15 NOTE — Telephone Encounter (Signed)
Please advise verbal orders and records request? MRI results requested are not in epic.

## 2019-06-15 NOTE — Telephone Encounter (Signed)
Home Health Verbal Orders - Caller/Agency: Kendra// [facility]  Callback Number: [phone] TGAIDK SMMO CAREQJEADG OT/PT/Skilled Nursing/Social Work/Speech Therapy: PT Frequency: extension of 2x for 2wks, 1x for 2wks   [name] [name] states that the pt is leaning to the right and that she is not all there cognitively either. She is requesting to have an evaluation for speech therapy and nursing. [name] [name] is also requesting pts most recent labs results and mri from hospital. Please advise.

## 2019-06-20 ENCOUNTER — Telehealth: Payer: Self-pay | Admitting: Family Medicine

## 2019-06-20 ENCOUNTER — Ambulatory Visit: Payer: Medicare Other | Attending: Internal Medicine

## 2019-06-20 ENCOUNTER — Other Ambulatory Visit: Payer: Self-pay | Admitting: Family Medicine

## 2019-06-20 ENCOUNTER — Telehealth: Payer: Self-pay | Admitting: Cardiovascular Disease

## 2019-06-20 DIAGNOSIS — E1142 Type 2 diabetes mellitus with diabetic polyneuropathy: Secondary | ICD-10-CM

## 2019-06-20 DIAGNOSIS — M109 Gout, unspecified: Secondary | ICD-10-CM | POA: Diagnosis not present

## 2019-06-20 DIAGNOSIS — I6529 Occlusion and stenosis of unspecified carotid artery: Secondary | ICD-10-CM | POA: Diagnosis not present

## 2019-06-20 DIAGNOSIS — I110 Hypertensive heart disease with heart failure: Secondary | ICD-10-CM | POA: Diagnosis not present

## 2019-06-20 DIAGNOSIS — I2510 Atherosclerotic heart disease of native coronary artery without angina pectoris: Secondary | ICD-10-CM | POA: Diagnosis not present

## 2019-06-20 DIAGNOSIS — C3411 Malignant neoplasm of upper lobe, right bronchus or lung: Secondary | ICD-10-CM | POA: Diagnosis not present

## 2019-06-20 DIAGNOSIS — E785 Hyperlipidemia, unspecified: Secondary | ICD-10-CM | POA: Diagnosis not present

## 2019-06-20 DIAGNOSIS — J449 Chronic obstructive pulmonary disease, unspecified: Secondary | ICD-10-CM | POA: Diagnosis not present

## 2019-06-20 DIAGNOSIS — Z7984 Long term (current) use of oral hypoglycemic drugs: Secondary | ICD-10-CM | POA: Diagnosis not present

## 2019-06-20 DIAGNOSIS — Z8673 Personal history of transient ischemic attack (TIA), and cerebral infarction without residual deficits: Secondary | ICD-10-CM | POA: Diagnosis not present

## 2019-06-20 DIAGNOSIS — Z7982 Long term (current) use of aspirin: Secondary | ICD-10-CM | POA: Diagnosis not present

## 2019-06-20 DIAGNOSIS — M4722 Other spondylosis with radiculopathy, cervical region: Secondary | ICD-10-CM | POA: Diagnosis not present

## 2019-06-20 DIAGNOSIS — K5900 Constipation, unspecified: Secondary | ICD-10-CM | POA: Diagnosis not present

## 2019-06-20 DIAGNOSIS — Z23 Encounter for immunization: Secondary | ICD-10-CM

## 2019-06-20 DIAGNOSIS — E782 Mixed hyperlipidemia: Secondary | ICD-10-CM

## 2019-06-20 DIAGNOSIS — E538 Deficiency of other specified B group vitamins: Secondary | ICD-10-CM | POA: Diagnosis not present

## 2019-06-20 DIAGNOSIS — E43 Unspecified severe protein-calorie malnutrition: Secondary | ICD-10-CM | POA: Diagnosis not present

## 2019-06-20 DIAGNOSIS — Z7902 Long term (current) use of antithrombotics/antiplatelets: Secondary | ICD-10-CM | POA: Diagnosis not present

## 2019-06-20 DIAGNOSIS — Z9181 History of falling: Secondary | ICD-10-CM | POA: Diagnosis not present

## 2019-06-20 DIAGNOSIS — I509 Heart failure, unspecified: Secondary | ICD-10-CM | POA: Diagnosis not present

## 2019-06-20 DIAGNOSIS — E1151 Type 2 diabetes mellitus with diabetic peripheral angiopathy without gangrene: Secondary | ICD-10-CM | POA: Diagnosis not present

## 2019-06-20 DIAGNOSIS — Z9981 Dependence on supplemental oxygen: Secondary | ICD-10-CM | POA: Diagnosis not present

## 2019-06-20 DIAGNOSIS — Z87891 Personal history of nicotine dependence: Secondary | ICD-10-CM | POA: Diagnosis not present

## 2019-06-20 NOTE — Progress Notes (Signed)
   Covid-19 Vaccination Clinic  Name:  [name] [name]    MRN: [id number] DOB: Oct 21, 1940  06/20/2019  [name] [name] was observed post Covid-19 immunization for 15 minutes without incident. She was provided with Vaccine Information Sheet and instruction to access the V-Safe system.   [name] [name] was instructed to call 911 with any severe reactions post vaccine: Marland Kitchen Difficulty breathing  . Swelling of face and throat  . A fast heartbeat  . A bad rash all over body  . Dizziness and weakness   Immunizations Administered    Name Date Dose VIS Date Route   Pfizer COVID-19 Vaccine 06/20/2019  9:46 AM 0.3 mL 03/17/2019 Intramuscular   Manufacturer: [place]   Lot: [id number]   [place]: [id number]

## 2019-06-20 NOTE — Telephone Encounter (Signed)
Requested Prescriptions  Pending Prescriptions Disp Refills  . clopidogrel (PLAVIX) 75 MG tablet [Pharmacy Med Name: CLOPIDOGREL  75MG   TAB] 90 tablet 3    Sig: TAKE 1 TABLET BY MOUTH  DAILY     Hematology: Antiplatelets - clopidogrel Failed - 06/20/2019 12:17 PM      Failed - Evaluate AST, ALT within 2 months of therapy initiation.      Failed - HCT in normal range and within 180 days    Hematocrit  Date Value Ref Range Status  06/14/2019 32.4 (L) 34.0 - 46.6 % Final         Failed - HGB in normal range and within 180 days    Hemoglobin  Date Value Ref Range Status  06/14/2019 10.4 (L) 11.1 - 15.9 g/dL Final         Passed - ALT in normal range and within 360 days    ALT  Date Value Ref Range Status  06/06/2019 19 0 - 44 U/L Final         Passed - AST in normal range and within 360 days    AST  Date Value Ref Range Status  06/06/2019 15 15 - 41 U/L Final         Passed - PLT in normal range and within 180 days    Platelets  Date Value Ref Range Status  06/14/2019 283 150 - 450 x10E3/uL Final         Passed - Valid encounter within last 6 months    Recent Outpatient Visits          6 days ago Essential hypertension, benign   [facility] Jerrol Banana., MD   2 weeks ago Cough   [place], [place], [place]   1 month ago HYPERTENSION, [place] Jerrol Banana., MD   2 months ago Weakness of both legs   [facility] Jerrol Banana., MD   3 months ago Encounter for laboratory testing for COVID-19 virus   [employer] Fl[facility]um, Kelby Aline, FNP      Future Appointments            Tomorrow Rockey Situ, Kathlene November, MD [facility], [place]   In 1 month Jerrol Banana., MD [facility]           . atorvastatin (LIPITOR) 40 MG tablet [Pharmacy Med Name: ATORVASTATIN  40MG   TAB] 90 tablet 3    Sig: TAKE 1 TABLET BY  MOUTH  DAILY AT 6 PM.     Cardiovascular:  Antilipid - Statins Passed - 06/20/2019 12:17 PM      Passed - Total Cholesterol in normal range and within 360 days    Cholesterol, Total  Date Value Ref Range Status  05/24/2017 210 (H) 100 - 199 mg/dL Final   Cholesterol  Date Value Ref Range Status  06/07/2019 149 0 - 200 mg/dL Final         Passed - LDL in normal range and within 360 days    LDL Calculated  Date Value Ref Range Status  05/24/2017 88 0 - 99 mg/dL Final   LDL Cholesterol  Date Value Ref Range Status  06/07/2019 84 0 - 99 mg/dL Final    Comment:           Total Cholesterol/HDL:CHD Risk Coronary Heart Disease Risk Table  Men   Women  1/2 Average Risk   3.4   3.3  Average Risk       5.0   4.4  2 X Average Risk   9.6   7.1  3 X Average Risk  23.4   11.0        Use the calculated Patient Ratio above and the CHD Risk Table to determine the patient's CHD Risk.        ATP III CLASSIFICATION (LDL):  <100     mg/dL   Optimal  100-129  mg/dL   Near or Above                    Optimal  130-159  mg/dL   Borderline  160-189  mg/dL   High  >190     mg/dL   Very High Performed at [facility] [address], [place], [place] [address]          Passed - HDL in normal range and within 360 days    HDL  Date Value Ref Range Status  06/07/2019 57 >40 mg/dL Final  05/24/2017 52 >39 mg/dL Final         Passed - Triglycerides in normal range and within 360 days    Triglycerides  Date Value Ref Range Status  06/07/2019 41 <150 mg/dL Final         Passed - Patient is not pregnant      Passed - Valid encounter within last 12 months    Recent Outpatient Visits          6 days ago Essential hypertension, benign   [facility] Jerrol Banana., MD   2 weeks ago Cough   [place], [place], [place]   1 month ago HYPERTENSION, [place] Jerrol Banana., MD   2 months  ago Weakness of both legs   [facility] Jerrol Banana., MD   3 months ago Encounter for laboratory testing for COVID-19 virus   [employer] [facility], Kelby Aline, FNP      Future Appointments            Tomorrow Rockey Situ, Kathlene November, MD [facility], [place]   In 1 month Jerrol Banana., MD [facility], [place]

## 2019-06-20 NOTE — Telephone Encounter (Signed)

## 2019-06-20 NOTE — Telephone Encounter (Signed)
[facility] faxed refill request for the following medications:   T/S ACCU-CHEK GUIDE   Please advise.  Thanks, [employer]

## 2019-06-21 ENCOUNTER — Ambulatory Visit (INDEPENDENT_AMBULATORY_CARE_PROVIDER_SITE_OTHER): Payer: Medicare Other | Admitting: Physician Assistant

## 2019-06-21 ENCOUNTER — Ambulatory Visit
Admission: RE | Admit: 2019-06-21 | Discharge: 2019-06-21 | Disposition: A | Discharge status: Home / Self Care | Payer: Medicare Other | Source: Ambulatory Visit | Attending: Family Medicine | Admitting: Family Medicine

## 2019-06-21 ENCOUNTER — Encounter: Payer: Self-pay | Admitting: Physician Assistant

## 2019-06-21 ENCOUNTER — Telehealth: Payer: Medicare Other | Admitting: Cardiovascular Disease

## 2019-06-21 ENCOUNTER — Telehealth: Payer: Self-pay

## 2019-06-21 ENCOUNTER — Other Ambulatory Visit: Payer: Self-pay

## 2019-06-21 ENCOUNTER — Ambulatory Visit
Admission: RE | Admit: 2019-06-21 | Discharge: 2019-06-21 | Disposition: A | Discharge status: Home / Self Care | Payer: Medicare Other | Source: Ambulatory Visit | Attending: Physician Assistant | Admitting: Physician Assistant

## 2019-06-21 VITALS — BP 118/62 | HR 86 | Temp 95.1°F

## 2019-06-21 DIAGNOSIS — S3992XA Unspecified injury of lower back, initial encounter: Secondary | ICD-10-CM | POA: Diagnosis not present

## 2019-06-21 DIAGNOSIS — L98421 Non-pressure chronic ulcer of back limited to breakdown of skin: Secondary | ICD-10-CM

## 2019-06-21 DIAGNOSIS — M533 Sacrococcygeal disorders, not elsewhere classified: Secondary | ICD-10-CM

## 2019-06-21 MED ORDER — ACCU-CHEK GUIDE W/DEVICE KIT: 1.0000 | PACK | Freq: Once | 0 refills | Status: DC

## 2019-06-21 MED ORDER — MUPIROCIN CALCIUM 2 % EX CREA: 1.0000 "application " | TOPICAL_CREAM | Freq: Two times a day (BID) | CUTANEOUS | 0 refills | Status: DC

## 2019-06-21 MED ORDER — ACCU-CHEK GUIDE VI STRP: ORAL_STRIP | 12 refills | Status: DC

## 2019-06-21 NOTE — Patient Instructions (Signed)
Pressure Injury  A pressure injury is damage to the skin and underlying tissue that results from pressure being applied to an area of the body. It often affects people who must spend a long time in a bed or chair because of a medical condition. Pressure injuries usually occur:  Over bony parts of the body, such as the tailbone, shoulders, elbows, hips, heels, spine, ankles, and back of the head.  Under medical devices that make contact with the body, such as respiratory equipment, stockings, tubes, and splints. Pressure injuries start as reddened areas on the skin and can lead to pain and an open wound. What are the causes? This condition is caused by frequent or constant pressure to an area of the body. Decreased blood flow to the skin can eventually cause the skin tissue to die and break down, causing a wound. What increases the risk? You are more likely to develop this condition if you:  Are in the hospital or an extended care facility.  Are bedridden or in a wheelchair.  Have an injury or disease that keeps you from: ? Moving normally. ? Feeling pain or pressure.  Have a condition that: ? Makes you sleepy or less alert. ? Causes poor blood flow.  Need to wear a medical device.  Have poor control of your bladder or bowel functions (incontinence).  Have poor nutrition (malnutrition). If you are at risk for pressure injuries, your health care provider may recommend certain types of mattresses, mattress covers, pillows, cushions, or boots to help prevent them. These may include products filled with air, foam, gel, or sand. What are the signs or symptoms? Symptoms of this condition depend on the severity of the injury. Symptoms may include:  Red or dark areas of the skin.  Pain, warmth, or a change of skin texture.  Blisters.  An open wound. How is this diagnosed? This condition is diagnosed with a medical history and physical exam. You may also have tests, such as:  Blood  tests.  Imaging tests.  Blood flow tests. Your pressure injury will be staged based on its severity. Staging is based on:  The depth of the tissue injury, including whether there is exposure of muscle, bone, or tendon.  The cause of the pressure injury. How is this treated? This condition may be treated by:  Relieving or redistributing pressure on your skin. This includes: ? Frequently changing your position. ? Avoiding positions that caused the wound or that can make the wound worse. ? Using specific bed mattresses, chair cushions, or protective boots. ? Moving medical devices from an area of pressure, or placing padding between the skin and the device. ? Using foams, creams, or powders to prevent rubbing (friction) on the skin.  Keeping your skin clean and dry. This may include using a skin cleanser or skin barrier as told by your health care provider.  Cleaning your injury and removing any dead tissue from the wound (debridement).  Placing a bandage (dressing) over your injury.  Using medicines for pain or to prevent or treat infection. Surgery may be needed if other treatments are not working or if your injury is very deep. Follow these instructions at home: Wound care  Follow instructions from your health care provider about how to take care of your wound. Make sure you: ? Wash your hands with soap and water before and after you change your bandage (dressing). If soap and water are not available, use hand sanitizer. ? Change your dressing as told   by your health care provider.  Check your wound every day for signs of infection. Have a caregiver do this for you if you are not able. Check for: ? Redness, swelling, or increased pain. ? More fluid or blood. ? Warmth. ? Pus or a bad smell. Skin care  Keep your skin clean and dry. Gently pat your skin dry.  Do not rub or massage your skin.  You or a caregiver should check your skin every day for any changes in color or  any new blisters or sores (ulcers). Medicines  Take over-the-counter and prescription medicines only as told by your health care provider.  If you were prescribed an antibiotic medicine, take or apply it as told by your health care provider. Do not stop using the antibiotic even if your condition improves. Reducing and redistributing pressure  Do not lie or sit in one position for a long time. Move or change position every 1-2 hours, or as told by your health care provider.  Use pillows or cushions to reduce pressure. Ask your health care provider to recommend cushions or pads for you. General instructions   Eat a healthy diet that includes lots of protein.  Drink enough fluid to keep your urine pale yellow.  Be as active as you can every day. Ask your health care provider to suggest safe exercises or activities.  Do not abuse drugs or alcohol.  Do not use any products that contain nicotine or tobacco, such as cigarettes, e-cigarettes, and chewing tobacco. If you need help quitting, ask your health care provider.  Keep all follow-up visits as told by your health care provider. This is important. Contact a health care provider if:  You have: ? A fever or chills. ? Pain that is not helped by medicine. ? Any changes in skin color. ? New blisters or sores. ? Pus or a bad smell coming from your wound. ? Redness, swelling, or pain around your wound. ? More fluid or blood coming from your wound.  Your wound does not improve after 1-2 weeks of treatment. Summary  A pressure injury is damage to the skin and underlying tissue that results from pressure being applied to an area of the body.  Do not lie or sit in one position for a long time. Your health care provider may advise you to move or change position every 1-2 hours.  Follow instructions from your health care provider about how to take care of your wound.  Keep all follow-up visits as told by your health care provider. This  is important. This information is not intended to replace advice given to you by your health care provider. Make sure you discuss any questions you have with your health care provider. Document Revised: 10/20/2017 Document Reviewed: 10/20/2017 Elsevier Patient Education  2020 [employer]  

## 2019-06-21 NOTE — Telephone Encounter (Signed)
Advised patient's husband [name]) of x-ray and that a RN from [facility] will be coming out to assess [name] [name].

## 2019-06-21 NOTE — Telephone Encounter (Signed)
-----   Message from Trinna Post, [place] sent at 06/21/2019  3:34 PM EDT ----- Can we call patient and let her know her tailbone is not fractured. She may still have pain in the area from the fall but I am more concerned about the wound in the area which I think may be due to frequent sitting. I have called [facility] and placed verbal orders for an RN to come out and assess her. The agency told me an RN will be coming out this week.

## 2019-06-21 NOTE — Progress Notes (Deleted)
{Choose 1 Note Type (Telehealth Visit or Telephone Visit):[phone]}   I connected with  [name] [name] on 06/21/19 by a video enabled telemedicine application and verified that I am speaking with the correct person using two identifiers. I discussed the limitations of evaluation and management by telemedicine. The patient expressed understanding and agreed to proceed.   Evaluation Performed:  Follow-up visit  Date:  06/21/2019   ID:  EDOM SCHMUHL, DOB 1941-03-10, MRN [id number]  Patient Location:  [address] [place] [address]   Provider location:   [facility], [place] office  PCP:  Jerrol Banana., MD  Cardiologist:  Arvid Right [facility]   Chief Complaint:      History of Present Illness:    [name] [name] is a 79 y.o. female who presents via audio/video conferencing for a telehealth visit today.   The patient does not symptoms concerning for COVID-19 infection (fever, chills, cough, or new SHORTNESS OF BREATH).   Patient has a past medical history of Ms. Halls is a 79 year old woman with a history of   COPD with ongoing tobacco use,  stroke,  hypertension,  diabetes, Prior history of poor control diabetes CVA   Hyperlipidemia H/o chest pain  minimal nonobstructive coronary artery disease by catheterization on May 08, 2004, repeat catheterization September 2012 showing 50% LAD disease, 60% RCA disease,  cardiac catheterization May 2015 showing no significant CAD  who presents for follow-up of her shortness of breath, chest pain history  in the hospital October 2016 with sugars of 600  In follow-up today she reports that she is doing well Sugars are doing better, was 100 this morning Trying to watch her diet Chest pain, rare episodes  Once a week , Sometimes a sharp pain , goes quickly  Takes NTG At times Has not appreciated any escalation in her symptoms  Scheduled for CT scan, follow-up of her lung cancer Finished treatments  early 2018   Lab work reviewed with her in detail  HBA1C 7.4 Total chol 186, LDL 86 From 2017  EKG personally reviewed by myself on todays visit  shows normal sinus rhythm with rate 67 bpm no significant ST or T-wave changes   Other past medical history reviewed  Lung CA, cancer treatment with complications Chemo x 3, In hospital after last round 9 days, platelets, PRBC, neulasta Going through XRT, on chest and brain Finishes 05/15/2106 Tolerating radiation well, so far Weight up and down Quit smoking since June 2017 Off asa, while she was in the hospital, has not restarted Continued chronic shortness of breath on exertion  Previously with leg swelling in the setting of anemia  HCT 23 in 02/2016 CR 1.7, BUN 37 Glu 157 she reports sugar levels are improved.  HBA1C 08/2014 was 7.9  History of normal echocardiogram and stress test Hemoglobin A1c January 2017 was 10.4  Carotid ultrasound reviewed from July 2016 with her showing 50-69% stenosis on the right, greater than 70% disease on the left. Since then she has had carotid endarterectomy on the left  Prior catheterization in 2015 showed no significant CAD. There is no evidence of previously seen moderate disease in the LAD or RCA. left arm symptoms were from musculoskeletal etiology, unable to exclude arthritis in her neck and DJD.  She has a long history of chronic shortness of breath likely secondary to COPD She continues to have problems with diabetes control. No lightheadedness, PND or orthopnea. ACE inhibitor held by pulmonary for  possible cough.   previous Total cholesterol 210, LDL 130, HDL 43    Prior CV studies:   The following studies were reviewed today:    Past Medical History:  Diagnosis Date  . Abnormal CT lung screening 10/17/2015  . COPD (chronic obstructive pulmonary disease) ([place])   . Coronary artery disease, non-occlusive    a. cath 2006: min nonobs CAD; b. cath 12/2010: cath LAD 50%, RCA 60%; c.  08/2013: Minimal luminal irregs, right dominant system with no significant CAD, diffuse luminal irregs noted. Normal EF 55%, no AS or MS.   . Diabetes mellitus   . Hyperlipemia    Followed by Dr. Rosanna Randy  . Hypertension   . Lung cancer ([place])   . Macular degeneration    rt  . Personal history of tobacco use, presenting hazards to health 10/15/2015  . Stroke [facility])    Past Surgical History:  Procedure Laterality Date  . ABDOMINAL HYSTERECTOMY    . ANTERIOR CERVICAL DECOMP/DISCECTOMY FUSION N/A 10/19/2013   Procedure: CERVICAL FIVE-SIX ANTERIOR CERVICAL DECOMPRESSION WITH FUSION INTERBODY PROSTHESIS PLATING AND PEEK CAGE;  Surgeon: Ophelia Charter, MD;  Location: [place] NEURO ORS;  Service: Neurosurgery;  Laterality: N/A;  . BACK SURGERY  80's  . BREAST CYST EXCISION Left    left negative   . CARDIAC CATHETERIZATION  05/2004  . CATARACT EXTRACTION Left   . CHOLECYSTECTOMY    . COLONOSCOPY N/A 12/29/2018   Procedure: COLONOSCOPY;  Surgeon: Toledo, Benay Pike, MD;  Location: [facility] ENDOSCOPY;  Service: Gastroenterology;  Laterality: N/A;  . ENDARTERECTOMY Left 10/24/2014   Procedure: ENDARTERECTOMY CAROTID;  Surgeon: Algernon Huxley, MD;  Location: [facility] ORS;  Service: Vascular;  Laterality: Left;  . ENDOBRONCHIAL ULTRASOUND N/A 11/14/2015   Procedure: ENDOBRONCHIAL ULTRASOUND;  Surgeon: [name], MD;  Location: [facility] ORS;  Service: Cardiopulmonary;  Laterality: N/A;  . PERIPHERAL VASCULAR CATHETERIZATION N/A 12/04/2015   Procedure: Glori Luis Cath Insertion;  Surgeon: Algernon Huxley, MD;  Location: [place] CV LAB;  Service: Cardiovascular;  Laterality: N/A;  . PORTA CATH REMOVAL N/A 05/17/2017   Procedure: PORTA CATH REMOVAL;  Surgeon: Algernon Huxley, MD;  Location: [place] CV LAB;  Service: Cardiovascular;  Laterality: N/A;  . TONSILLECTOMY AND ADENOIDECTOMY    . VESICOVAGINAL FISTULA CLOSURE W/ TAH        Allergies:   Coconut fatty acids   Social History   Tobacco Use  . Smoking status:  Former Smoker    Packs/day: 1.00    Years: 50.00    Pack years: 50.00    Types: Cigarettes    Quit date: 10/03/2015    Years since quitting: 3.7  . Smokeless tobacco: Never Used  . Tobacco comment: smokes 3 cigs daily 05/06/15. Pt instructed to quit.  Substance Use Topics  . Alcohol use: No    Alcohol/week: 0.0 standard drinks  . Drug use: No     Current Outpatient Medications on File Prior to Visit  Medication Sig Dispense Refill  . albuterol (VENTOLIN HFA) 108 (90 Base) MCG/ACT inhaler Inhale 2 puffs into the lungs every 4 (four) hours as needed for wheezing or shortness of breath. 8 g 11  . amLODipine (NORVASC) 5 MG tablet Take 1 tablet (5 mg total) by mouth daily. 30 tablet 2  . aspirin EC 81 MG EC tablet Take 1 tablet (81 mg total) by mouth daily. 30 tablet 0  . atorvastatin (LIPITOR) 40 MG tablet TAKE 1 TABLET BY MOUTH  DAILY AT 6 PM. 90  tablet 3  . blood glucose meter kit and supplies Dispense based on patient and insurance preference. Use once daily as directed. (FOR ICD-10  E11.42). 1 each 0  . clopidogrel (PLAVIX) 75 MG tablet TAKE 1 TABLET BY MOUTH  DAILY 90 tablet 3  . donepezil (ARICEPT) 5 MG tablet Take 1 tablet (5 mg total) by mouth at bedtime. 30 tablet 2  . fluticasone (FLONASE) 50 MCG/ACT nasal spray Place 2 sprays into both nostrils daily. (Patient taking differently: Place 2 sprays into both nostrils daily as needed for allergies. ) 16 g 6  . gabapentin (NEURONTIN) 600 MG tablet TAKE ONE-HALF TABLET BY  MOUTH TWO TIMES DAILY (Patient taking differently: Take 300 mg by mouth 2 (two) times daily. ) 90 tablet 3  . glimepiride (AMARYL) 2 MG tablet TAKE 1 TABLET BY MOUTH TWO  TIMES DAILY 180 tablet 3  . glucose blood (ONETOUCH ULTRA) test strip USE TO CHECK BLOOD SUGAR ONCE DAILY 100 strip 3  . ipratropium-albuterol (DUONEB) 0.5-2.5 (3) MG/3ML SOLN Take 3 mLs by nebulization every 4 (four) hours as needed. 360 mL 1  . isosorbide mononitrate (IMDUR) 60 MG 24 hr tablet Take 1  tablet (60 mg total) by mouth daily. 90 tablet 3  . LORazepam (ATIVAN) 1 MG tablet Take 1 tablet (1 mg total) by mouth 2 (two) times daily as needed for anxiety. 30 tablet 5  . magnesium oxide (MAG-OX) 400 (241.3 MG) MG tablet Take 400 mg by mouth 2 (two) times daily.     . metFORMIN (GLUCOPHAGE) 500 MG tablet Take 1 tablet (500 mg total) by mouth 2 (two) times daily with a meal. (Patient taking differently: Take 500 mg by mouth daily with breakfast. Can only tolerate once daily with meal) 180 tablet 3  . metoprolol succinate (TOPROL-XL) 100 MG 24 hr tablet Take 1 tablet (100 mg total) by mouth daily. 90 tablet 3  . mometasone (ELOCON) 0.1 % lotion Apply 4 drops topically at bedtime as needed (dry skin).     . Multiple Vitamin (MULTIVITAMIN) tablet Take 0.5 tablets by mouth 2 (two) times daily.    . Multiple Vitamins-Minerals (ICAPS) CAPS Take 1 capsule by mouth 2 (two) times daily.     . nitroGLYCERIN (NITROSTAT) 0.4 MG SL tablet Place 1 tablet (0.4 mg total) under the tongue every 5 (five) minutes as needed for chest pain. 25 tablet 3  . pantoprazole (PROTONIX) 40 MG tablet TAKE 1 TABLET BY MOUTH  DAILY (Patient not taking: Reported on 06/14/2019) 90 tablet 1  . sucralfate (CARAFATE) 1 g tablet Take 1 tablet (1 g total) by mouth 2 (two) times daily. 180 tablet 3  . VITAMIN D PO Take by mouth.    . vitamin E (VITAMIN E) 400 UNIT capsule Take 400 Units by mouth daily.     Current Facility-Administered Medications on File Prior to Visit  Medication Dose Route Frequency Provider Last Rate Last Admin  . ondansetron (ZOFRAN) 8 mg, dexamethasone (DECADRON) 10 mg in sodium chloride 0.9 % 50 mL IVPB   Intravenous Once Lloyd Huger, MD         Family Hx: The patient's family history includes Heart attack in her brother, brother, father, and paternal grandmother; Lung cancer in her maternal grandfather; Stroke in her mother.  ROS:   Please see the history of present illness.    ROS     Labs/Other Tests and Data Reviewed:    Recent Labs: 06/06/2019: ALT 19; B Natriuretic Peptide 127.7 06/14/2019:  BUN 44; Creatinine, Ser 1.79; Hemoglobin 10.4; Platelets 283; Potassium 4.4; Sodium 140   Recent Lipid Panel Lab Results  Component Value Date/Time   CHOL 149 06/07/2019 01:41 AM   CHOL 210 (H) 05/24/2017 02:11 PM   TRIG 41 06/07/2019 01:41 AM   HDL 57 06/07/2019 01:41 AM   HDL 52 05/24/2017 02:11 PM   CHOLHDL 2.6 06/07/2019 01:41 AM   LDLCALC 84 06/07/2019 01:41 AM   LDLCALC 88 05/24/2017 02:11 PM    Wt Readings from Last 3 Encounters:  06/12/19 135 lb (61.2 kg)  06/06/19 136 lb (61.7 kg)  04/03/19 135 lb (61.2 kg)     Exam:    Vital Signs: Vital signs may also be detailed in the HPI There were no vitals taken for this visit.  Wt Readings from Last 3 Encounters:  06/12/19 135 lb (61.2 kg)  06/06/19 136 lb (61.7 kg)  04/03/19 135 lb (61.2 kg)   Temp Readings from Last 3 Encounters:  06/14/19 (!) 96.6 F (35.9 C) (Other (Comment))  06/07/19 98.3 F (36.8 C) (Oral)  04/03/19 (!) 96.8 F (36 C) (Temporal)   BP Readings from Last 3 Encounters:  06/14/19 130/72  06/12/19 (!) 164/80  06/07/19 (!) 142/63   Pulse Readings from Last 3 Encounters:  06/14/19 (!) 59  06/12/19 60  06/07/19 63     Well nourished, well developed female in no acute distress. Constitutional:  oriented to person, place, and time. No distress.  Head: Normocephalic and atraumatic.  Eyes:  no discharge. No scleral icterus.  Neck: Normal range of motion. Neck supple.  Pulmonary/Chest: No audible wheezing, no distress, appears comfortable Musculoskeletal: Normal range of motion.  no  tenderness or deformity.  Neurological:   Coordination normal. Full exam not performed Skin:  No rash Psychiatric:  normal mood and affect. behavior is normal. Thought content normal.    ASSESSMENT & PLAN:    Problem List Items Addressed This Visit    None     Mixed hyperlipidemia LDL above  goal, recommended she increase Lipitor up to 40 mg daily   she'll continue to work on her diabetes  HYPERTENSION, BENIGN - Plan: EKG 12-Lead Blood pressure is well controlled on today's visit. No changes made to the medications.  stable angina ([place]) - Plan: EKG 12-Lead Stable anginal symptoms, takes nitroglycerin when necessary   TOBACCO ABUSE Stop smoking June 2017  Centrilobular emphysema ([facility]) Stable symptoms, on inhalers. Stop smoking 2017 Lung cancer, completed treatment   surveillance CT scans scheduled  Diabetic polyneuropathy associated with other specified diabetes mellitus ([place]) Working on her diabetes, hemoglobin A1c improved down to 7.4   Primary cancer of right upper lobe of lung ([place]) Long discussion concerning recent events, recovered relatively well from treatments earlier in the year   Type 2 diabetes mellitus with other circulatory complication, unspecified long term insulin use status ([place]) We have encouraged continued exercise, careful diet management in an effort to lose weight.  COVID-19 Education: The signs and symptoms of COVID-19 were discussed with the patient and how to seek care for testing (follow up with PCP or arrange E-visit).  The importance of social distancing was discussed today.  Patient Risk:   After full review of this patients clinical status, I feel that they are at least moderate risk at this time.  Time:   Today, I have spent 25 minutes with the patient with telehealth technology discussing the cardiac and medical problems/diagnoses detailed above   Additional 10 min spent reviewing  the chart prior to patient visit today   Medication Adjustments/Labs and Tests Ordered: Current medicines are reviewed at length with the patient today.  Concerns regarding medicines are outlined above.   Tests Ordered: No tests ordered   Medication Changes: No changes made   Disposition: Follow-up in 12 months   Signed, Ida Rogue, MD   [place] Office [address] [place] [address], [place], [place] [address]

## 2019-06-21 NOTE — Progress Notes (Signed)
Patient: [name] [name] Female    DOB: 1940-10-28   79 y.o.   MRN: [id number] Visit Date: 06/21/2019  Today's Provider: Trinna Post, PA-C   Chief Complaint  Patient presents with  . Skin Check   Subjective:     HPI  Skin Check Patient presents today for skin check. Patient husband states that she had a fall 3 weeks ago and afterwards he noticed a spot above her tailbone. An xray of her left hip was done by her home health care on 05/26/2019 which did not show a fracture. No comment about coccyx on that image. Patients husband reports tailbone area has really been bothering patient. Patient is not mobile anymore and sits frequently. She is unable to get up without assistance. She does have some episodes and perhaps some incontinence issues. Husband has to help her mobilize. She is sitting the entire day.    Allergies  Allergen Reactions  . Coconut Fatty Acids Swelling and Other (See Comments)    Throat swells     Current Outpatient Medications:  .  albuterol (VENTOLIN HFA) 108 (90 Base) MCG/ACT inhaler, Inhale 2 puffs into the lungs every 4 (four) hours as needed for wheezing or shortness of breath., Disp: 8 g, Rfl: 11 .  amLODipine (NORVASC) 5 MG tablet, Take 1 tablet (5 mg total) by mouth daily., Disp: 30 tablet, Rfl: 2 .  aspirin EC 81 MG EC tablet, Take 1 tablet (81 mg total) by mouth daily., Disp: 30 tablet, Rfl: 0 .  atorvastatin (LIPITOR) 40 MG tablet, TAKE 1 TABLET BY MOUTH  DAILY AT 6 PM., Disp: 90 tablet, Rfl: 3 .  blood glucose meter kit and supplies, Dispense based on patient and insurance preference. Use once daily as directed. (FOR ICD-10  E11.42)., Disp: 1 each, Rfl: 0 .  clopidogrel (PLAVIX) 75 MG tablet, TAKE 1 TABLET BY MOUTH  DAILY, Disp: 90 tablet, Rfl: 3 .  donepezil (ARICEPT) 5 MG tablet, Take 1 tablet (5 mg total) by mouth at bedtime., Disp: 30 tablet, Rfl: 2 .  fluticasone (FLONASE) 50 MCG/ACT nasal spray, Place 2 sprays into both nostrils daily.  (Patient taking differently: Place 2 sprays into both nostrils daily as needed for allergies. ), Disp: 16 g, Rfl: 6 .  gabapentin (NEURONTIN) 600 MG tablet, TAKE ONE-HALF TABLET BY  MOUTH TWO TIMES DAILY (Patient taking differently: Take 300 mg by mouth 2 (two) times daily. ), Disp: 90 tablet, Rfl: 3 .  glimepiride (AMARYL) 2 MG tablet, TAKE 1 TABLET BY MOUTH TWO  TIMES DAILY, Disp: 180 tablet, Rfl: 3 .  glucose blood (ACCU-CHEK GUIDE) test strip, Check Blood Sugars Once Daily. Dx Code: E11.42, Disp: 100 each, Rfl: 12 .  ipratropium-albuterol (DUONEB) 0.5-2.5 (3) MG/3ML SOLN, Take 3 mLs by nebulization every 4 (four) hours as needed., Disp: 360 mL, Rfl: 1 .  isosorbide mononitrate (IMDUR) 60 MG 24 hr tablet, Take 1 tablet (60 mg total) by mouth daily., Disp: 90 tablet, Rfl: 3 .  LORazepam (ATIVAN) 1 MG tablet, Take 1 tablet (1 mg total) by mouth 2 (two) times daily as needed for anxiety., Disp: 30 tablet, Rfl: 5 .  magnesium oxide (MAG-OX) 400 (241.3 MG) MG tablet, Take 400 mg by mouth 2 (two) times daily. , Disp: , Rfl:  .  metFORMIN (GLUCOPHAGE) 500 MG tablet, Take 1 tablet (500 mg total) by mouth 2 (two) times daily with a meal. (Patient taking differently: Take 500 mg by mouth daily with  breakfast. Can only tolerate once daily with meal), Disp: 180 tablet, Rfl: 3 .  metoprolol succinate (TOPROL-XL) 100 MG 24 hr tablet, Take 1 tablet (100 mg total) by mouth daily., Disp: 90 tablet, Rfl: 3 .  mometasone (ELOCON) 0.1 % lotion, Apply 4 drops topically at bedtime as needed (dry skin). , Disp: , Rfl:  .  Multiple Vitamin (MULTIVITAMIN) tablet, Take 0.5 tablets by mouth 2 (two) times daily., Disp: , Rfl:  .  Multiple Vitamins-Minerals (ICAPS) CAPS, Take 1 capsule by mouth 2 (two) times daily. , Disp: , Rfl:  .  nitroGLYCERIN (NITROSTAT) 0.4 MG SL tablet, Place 1 tablet (0.4 mg total) under the tongue every 5 (five) minutes as needed for chest pain., Disp: 25 tablet, Rfl: 3 .  pantoprazole (PROTONIX) 40 MG  tablet, TAKE 1 TABLET BY MOUTH  DAILY, Disp: 90 tablet, Rfl: 1 .  sucralfate (CARAFATE) 1 g tablet, Take 1 tablet (1 g total) by mouth 2 (two) times daily., Disp: 180 tablet, Rfl: 3 .  VITAMIN D PO, Take by mouth., Disp: , Rfl:  .  vitamin E (VITAMIN E) 400 UNIT capsule, Take 400 Units by mouth daily., Disp: , Rfl:  No current facility-administered medications for this visit.  Facility-Administered Medications Ordered in Other Visits:  .  ondansetron (ZOFRAN) 8 mg, dexamethasone (DECADRON) 10 mg in sodium chloride 0.9 % 50 mL IVPB, , Intravenous, Once, Finne[place], Kathlene November, MD  Review of Systems  Social History   Tobacco Use  . Smoking status: Former Smoker    Packs/day: 1.00    Years: 50.00    Pack years: 50.00    Types: Cigarettes    Quit date: 10/03/2015    Years since quitting: 3.7  . Smokeless tobacco: Never Used  . Tobacco comment: smokes 3 cigs daily 05/06/15. Pt instructed to quit.  Substance Use Topics  . Alcohol use: No    Alcohol/week: 0.0 standard drinks      Objective:   BP 118/62 (BP Location: Right Arm, Patient Position: Sitting, Cuff Size: Normal)   Pulse 86   Temp (!) 95.1 F (35.1 C) (Temporal)   SpO2 95%  Vitals:   06/21/19 1049  BP: 118/62  Pulse: 86  Temp: (!) 95.1 F (35.1 C)  TempSrc: Temporal  SpO2: 95%  There is no height or weight on file to calculate BMI.   Physical Exam Skin:    General: Skin is warm and dry.     Findings: Lesion present.       Neurological:     Mental Status: Mental status is at baseline.  Psychiatric:        Mood and Affect: Mood normal.        Behavior: Behavior normal.      No results found for any visits on 06/21/19.     Assessment & Plan    1. Skin ulcer of sacrum, limited to breakdown of skin [facility])  It is very difficult to examine patient as she presents in a wheelchair and she is unable to stand on her own, husband must hold her up. However, from limited examination it appears there is a sacral ulcer.  She fell four weeks ago and if this were a simple abrasion, I would imagine it would have healed by now. She frequently sits and observing her sitting in the office she is slumped into a position where most of her pressure is concentrated on her sacrum. I do not think the area looks infected, but husband is concerned  and so I will give abx cream as below. I have advised them it is likely a result of pressure and the focus needs to be on dressing the wound and preventing worsening.   I will get an xray as below but I have also put in a verbal order to [facility] for an RN to come to the home and evaluate and treat the wound. [facility] informs me an RN will be out this week. They are currently only receiving PT from [facility].   - mupirocin cream (BACTROBAN) 2 %; Apply 1 application topically 2 (two) times daily.  Dispense: 15 g; Refill: 0  2. Sacral pain  - DG Sacrum/Coccyx; Future  The entirety of the information documented in the History of Present Illness, Review of Systems and Physical Exam were personally obtained by me. Portions of this information were initially documented by [facility] and reviewed by me for thoroughness and accuracy.   F/u PRN  I have spent 25 minutes with this patient, >50% of which was spent on counseling and coordination of care.      Trinna Post, PA-C  [facility]

## 2019-06-22 DIAGNOSIS — M109 Gout, unspecified: Secondary | ICD-10-CM | POA: Diagnosis not present

## 2019-06-22 DIAGNOSIS — I2510 Atherosclerotic heart disease of native coronary artery without angina pectoris: Secondary | ICD-10-CM | POA: Diagnosis not present

## 2019-06-22 DIAGNOSIS — I6529 Occlusion and stenosis of unspecified carotid artery: Secondary | ICD-10-CM | POA: Diagnosis not present

## 2019-06-22 DIAGNOSIS — C3411 Malignant neoplasm of upper lobe, right bronchus or lung: Secondary | ICD-10-CM | POA: Diagnosis not present

## 2019-06-22 DIAGNOSIS — K5900 Constipation, unspecified: Secondary | ICD-10-CM | POA: Diagnosis not present

## 2019-06-22 DIAGNOSIS — E785 Hyperlipidemia, unspecified: Secondary | ICD-10-CM | POA: Diagnosis not present

## 2019-06-22 DIAGNOSIS — I509 Heart failure, unspecified: Secondary | ICD-10-CM | POA: Diagnosis not present

## 2019-06-22 DIAGNOSIS — M4722 Other spondylosis with radiculopathy, cervical region: Secondary | ICD-10-CM | POA: Diagnosis not present

## 2019-06-22 DIAGNOSIS — Z8673 Personal history of transient ischemic attack (TIA), and cerebral infarction without residual deficits: Secondary | ICD-10-CM | POA: Diagnosis not present

## 2019-06-22 DIAGNOSIS — Z9181 History of falling: Secondary | ICD-10-CM | POA: Diagnosis not present

## 2019-06-22 DIAGNOSIS — Z7902 Long term (current) use of antithrombotics/antiplatelets: Secondary | ICD-10-CM | POA: Diagnosis not present

## 2019-06-22 DIAGNOSIS — I110 Hypertensive heart disease with heart failure: Secondary | ICD-10-CM | POA: Diagnosis not present

## 2019-06-22 DIAGNOSIS — E1151 Type 2 diabetes mellitus with diabetic peripheral angiopathy without gangrene: Secondary | ICD-10-CM | POA: Diagnosis not present

## 2019-06-22 DIAGNOSIS — Z7982 Long term (current) use of aspirin: Secondary | ICD-10-CM | POA: Diagnosis not present

## 2019-06-22 DIAGNOSIS — Z87891 Personal history of nicotine dependence: Secondary | ICD-10-CM | POA: Diagnosis not present

## 2019-06-22 DIAGNOSIS — J449 Chronic obstructive pulmonary disease, unspecified: Secondary | ICD-10-CM | POA: Diagnosis not present

## 2019-06-22 DIAGNOSIS — E43 Unspecified severe protein-calorie malnutrition: Secondary | ICD-10-CM | POA: Diagnosis not present

## 2019-06-22 DIAGNOSIS — E538 Deficiency of other specified B group vitamins: Secondary | ICD-10-CM | POA: Diagnosis not present

## 2019-06-22 DIAGNOSIS — E1142 Type 2 diabetes mellitus with diabetic polyneuropathy: Secondary | ICD-10-CM | POA: Diagnosis not present

## 2019-06-22 DIAGNOSIS — Z7984 Long term (current) use of oral hypoglycemic drugs: Secondary | ICD-10-CM | POA: Diagnosis not present

## 2019-06-22 DIAGNOSIS — Z9981 Dependence on supplemental oxygen: Secondary | ICD-10-CM | POA: Diagnosis not present

## 2019-06-23 DIAGNOSIS — Z8673 Personal history of transient ischemic attack (TIA), and cerebral infarction without residual deficits: Secondary | ICD-10-CM | POA: Diagnosis not present

## 2019-06-23 DIAGNOSIS — Z7984 Long term (current) use of oral hypoglycemic drugs: Secondary | ICD-10-CM | POA: Diagnosis not present

## 2019-06-23 DIAGNOSIS — M109 Gout, unspecified: Secondary | ICD-10-CM | POA: Diagnosis not present

## 2019-06-23 DIAGNOSIS — Z87891 Personal history of nicotine dependence: Secondary | ICD-10-CM | POA: Diagnosis not present

## 2019-06-23 DIAGNOSIS — M4722 Other spondylosis with radiculopathy, cervical region: Secondary | ICD-10-CM | POA: Diagnosis not present

## 2019-06-23 DIAGNOSIS — E1151 Type 2 diabetes mellitus with diabetic peripheral angiopathy without gangrene: Secondary | ICD-10-CM | POA: Diagnosis not present

## 2019-06-23 DIAGNOSIS — K5900 Constipation, unspecified: Secondary | ICD-10-CM | POA: Diagnosis not present

## 2019-06-23 DIAGNOSIS — E1142 Type 2 diabetes mellitus with diabetic polyneuropathy: Secondary | ICD-10-CM | POA: Diagnosis not present

## 2019-06-23 DIAGNOSIS — Z9981 Dependence on supplemental oxygen: Secondary | ICD-10-CM | POA: Diagnosis not present

## 2019-06-23 DIAGNOSIS — Z7902 Long term (current) use of antithrombotics/antiplatelets: Secondary | ICD-10-CM | POA: Diagnosis not present

## 2019-06-23 DIAGNOSIS — J449 Chronic obstructive pulmonary disease, unspecified: Secondary | ICD-10-CM | POA: Diagnosis not present

## 2019-06-23 DIAGNOSIS — I110 Hypertensive heart disease with heart failure: Secondary | ICD-10-CM | POA: Diagnosis not present

## 2019-06-23 DIAGNOSIS — C3411 Malignant neoplasm of upper lobe, right bronchus or lung: Secondary | ICD-10-CM | POA: Diagnosis not present

## 2019-06-23 DIAGNOSIS — I509 Heart failure, unspecified: Secondary | ICD-10-CM | POA: Diagnosis not present

## 2019-06-23 DIAGNOSIS — I6529 Occlusion and stenosis of unspecified carotid artery: Secondary | ICD-10-CM | POA: Diagnosis not present

## 2019-06-23 DIAGNOSIS — I2510 Atherosclerotic heart disease of native coronary artery without angina pectoris: Secondary | ICD-10-CM | POA: Diagnosis not present

## 2019-06-23 DIAGNOSIS — E785 Hyperlipidemia, unspecified: Secondary | ICD-10-CM | POA: Diagnosis not present

## 2019-06-23 DIAGNOSIS — Z7982 Long term (current) use of aspirin: Secondary | ICD-10-CM | POA: Diagnosis not present

## 2019-06-23 DIAGNOSIS — E43 Unspecified severe protein-calorie malnutrition: Secondary | ICD-10-CM | POA: Diagnosis not present

## 2019-06-23 DIAGNOSIS — Z9181 History of falling: Secondary | ICD-10-CM | POA: Diagnosis not present

## 2019-06-23 DIAGNOSIS — E538 Deficiency of other specified B group vitamins: Secondary | ICD-10-CM | POA: Diagnosis not present

## 2019-06-24 DIAGNOSIS — R0602 Shortness of breath: Secondary | ICD-10-CM | POA: Diagnosis not present

## 2019-06-24 DIAGNOSIS — J449 Chronic obstructive pulmonary disease, unspecified: Secondary | ICD-10-CM | POA: Diagnosis not present

## 2019-06-26 DIAGNOSIS — I509 Heart failure, unspecified: Secondary | ICD-10-CM | POA: Diagnosis not present

## 2019-06-26 DIAGNOSIS — I110 Hypertensive heart disease with heart failure: Secondary | ICD-10-CM | POA: Diagnosis not present

## 2019-06-26 DIAGNOSIS — K5900 Constipation, unspecified: Secondary | ICD-10-CM | POA: Diagnosis not present

## 2019-06-26 DIAGNOSIS — Z9181 History of falling: Secondary | ICD-10-CM | POA: Diagnosis not present

## 2019-06-26 DIAGNOSIS — Z87891 Personal history of nicotine dependence: Secondary | ICD-10-CM | POA: Diagnosis not present

## 2019-06-26 DIAGNOSIS — J449 Chronic obstructive pulmonary disease, unspecified: Secondary | ICD-10-CM | POA: Diagnosis not present

## 2019-06-26 DIAGNOSIS — E1142 Type 2 diabetes mellitus with diabetic polyneuropathy: Secondary | ICD-10-CM | POA: Diagnosis not present

## 2019-06-26 DIAGNOSIS — M109 Gout, unspecified: Secondary | ICD-10-CM | POA: Diagnosis not present

## 2019-06-26 DIAGNOSIS — Z7984 Long term (current) use of oral hypoglycemic drugs: Secondary | ICD-10-CM | POA: Diagnosis not present

## 2019-06-26 DIAGNOSIS — Z8673 Personal history of transient ischemic attack (TIA), and cerebral infarction without residual deficits: Secondary | ICD-10-CM | POA: Diagnosis not present

## 2019-06-26 DIAGNOSIS — E43 Unspecified severe protein-calorie malnutrition: Secondary | ICD-10-CM | POA: Diagnosis not present

## 2019-06-26 DIAGNOSIS — Z9981 Dependence on supplemental oxygen: Secondary | ICD-10-CM | POA: Diagnosis not present

## 2019-06-26 DIAGNOSIS — E785 Hyperlipidemia, unspecified: Secondary | ICD-10-CM | POA: Diagnosis not present

## 2019-06-26 DIAGNOSIS — I6529 Occlusion and stenosis of unspecified carotid artery: Secondary | ICD-10-CM | POA: Diagnosis not present

## 2019-06-26 DIAGNOSIS — Z7902 Long term (current) use of antithrombotics/antiplatelets: Secondary | ICD-10-CM | POA: Diagnosis not present

## 2019-06-26 DIAGNOSIS — C3411 Malignant neoplasm of upper lobe, right bronchus or lung: Secondary | ICD-10-CM | POA: Diagnosis not present

## 2019-06-26 DIAGNOSIS — M4722 Other spondylosis with radiculopathy, cervical region: Secondary | ICD-10-CM | POA: Diagnosis not present

## 2019-06-26 DIAGNOSIS — E538 Deficiency of other specified B group vitamins: Secondary | ICD-10-CM | POA: Diagnosis not present

## 2019-06-26 DIAGNOSIS — E1151 Type 2 diabetes mellitus with diabetic peripheral angiopathy without gangrene: Secondary | ICD-10-CM | POA: Diagnosis not present

## 2019-06-26 DIAGNOSIS — Z7982 Long term (current) use of aspirin: Secondary | ICD-10-CM | POA: Diagnosis not present

## 2019-06-26 DIAGNOSIS — I2510 Atherosclerotic heart disease of native coronary artery without angina pectoris: Secondary | ICD-10-CM | POA: Diagnosis not present

## 2019-06-27 ENCOUNTER — Other Ambulatory Visit: Payer: Medicare Other

## 2019-06-27 ENCOUNTER — Ambulatory Visit: Payer: Medicare Other

## 2019-06-28 DIAGNOSIS — C3411 Malignant neoplasm of upper lobe, right bronchus or lung: Secondary | ICD-10-CM | POA: Diagnosis not present

## 2019-06-28 DIAGNOSIS — Z9981 Dependence on supplemental oxygen: Secondary | ICD-10-CM | POA: Diagnosis not present

## 2019-06-28 DIAGNOSIS — Z8673 Personal history of transient ischemic attack (TIA), and cerebral infarction without residual deficits: Secondary | ICD-10-CM | POA: Diagnosis not present

## 2019-06-28 DIAGNOSIS — E538 Deficiency of other specified B group vitamins: Secondary | ICD-10-CM | POA: Diagnosis not present

## 2019-06-28 DIAGNOSIS — M4722 Other spondylosis with radiculopathy, cervical region: Secondary | ICD-10-CM | POA: Diagnosis not present

## 2019-06-28 DIAGNOSIS — I2510 Atherosclerotic heart disease of native coronary artery without angina pectoris: Secondary | ICD-10-CM | POA: Diagnosis not present

## 2019-06-28 DIAGNOSIS — E1142 Type 2 diabetes mellitus with diabetic polyneuropathy: Secondary | ICD-10-CM | POA: Diagnosis not present

## 2019-06-28 DIAGNOSIS — Z7984 Long term (current) use of oral hypoglycemic drugs: Secondary | ICD-10-CM | POA: Diagnosis not present

## 2019-06-28 DIAGNOSIS — J449 Chronic obstructive pulmonary disease, unspecified: Secondary | ICD-10-CM | POA: Diagnosis not present

## 2019-06-28 DIAGNOSIS — E1151 Type 2 diabetes mellitus with diabetic peripheral angiopathy without gangrene: Secondary | ICD-10-CM | POA: Diagnosis not present

## 2019-06-28 DIAGNOSIS — Z7982 Long term (current) use of aspirin: Secondary | ICD-10-CM | POA: Diagnosis not present

## 2019-06-28 DIAGNOSIS — Z87891 Personal history of nicotine dependence: Secondary | ICD-10-CM | POA: Diagnosis not present

## 2019-06-28 DIAGNOSIS — M109 Gout, unspecified: Secondary | ICD-10-CM | POA: Diagnosis not present

## 2019-06-28 DIAGNOSIS — I509 Heart failure, unspecified: Secondary | ICD-10-CM | POA: Diagnosis not present

## 2019-06-28 DIAGNOSIS — Z9181 History of falling: Secondary | ICD-10-CM | POA: Diagnosis not present

## 2019-06-28 DIAGNOSIS — E43 Unspecified severe protein-calorie malnutrition: Secondary | ICD-10-CM | POA: Diagnosis not present

## 2019-06-28 DIAGNOSIS — Z7902 Long term (current) use of antithrombotics/antiplatelets: Secondary | ICD-10-CM | POA: Diagnosis not present

## 2019-06-28 DIAGNOSIS — E785 Hyperlipidemia, unspecified: Secondary | ICD-10-CM | POA: Diagnosis not present

## 2019-06-28 DIAGNOSIS — I110 Hypertensive heart disease with heart failure: Secondary | ICD-10-CM | POA: Diagnosis not present

## 2019-06-28 DIAGNOSIS — K5900 Constipation, unspecified: Secondary | ICD-10-CM | POA: Diagnosis not present

## 2019-06-28 DIAGNOSIS — I6529 Occlusion and stenosis of unspecified carotid artery: Secondary | ICD-10-CM | POA: Diagnosis not present

## 2019-06-29 ENCOUNTER — Ambulatory Visit: Payer: Medicare Other | Admitting: Oncology

## 2019-07-02 ENCOUNTER — Other Ambulatory Visit: Payer: Self-pay | Admitting: Family Medicine

## 2019-07-02 DIAGNOSIS — E1142 Type 2 diabetes mellitus with diabetic polyneuropathy: Secondary | ICD-10-CM

## 2019-07-03 DIAGNOSIS — L89321 Pressure ulcer of left buttock, stage 1: Secondary | ICD-10-CM | POA: Diagnosis not present

## 2019-07-03 DIAGNOSIS — H353221 Exudative age-related macular degeneration, left eye, with active choroidal neovascularization: Secondary | ICD-10-CM | POA: Diagnosis not present

## 2019-07-04 ENCOUNTER — Telehealth: Payer: Self-pay | Admitting: Family Medicine

## 2019-07-04 DIAGNOSIS — E538 Deficiency of other specified B group vitamins: Secondary | ICD-10-CM | POA: Diagnosis not present

## 2019-07-04 DIAGNOSIS — E1151 Type 2 diabetes mellitus with diabetic peripheral angiopathy without gangrene: Secondary | ICD-10-CM | POA: Diagnosis not present

## 2019-07-04 DIAGNOSIS — Z8673 Personal history of transient ischemic attack (TIA), and cerebral infarction without residual deficits: Secondary | ICD-10-CM | POA: Diagnosis not present

## 2019-07-04 DIAGNOSIS — I6529 Occlusion and stenosis of unspecified carotid artery: Secondary | ICD-10-CM | POA: Diagnosis not present

## 2019-07-04 DIAGNOSIS — M4722 Other spondylosis with radiculopathy, cervical region: Secondary | ICD-10-CM | POA: Diagnosis not present

## 2019-07-04 DIAGNOSIS — I509 Heart failure, unspecified: Secondary | ICD-10-CM | POA: Diagnosis not present

## 2019-07-04 DIAGNOSIS — K5900 Constipation, unspecified: Secondary | ICD-10-CM | POA: Diagnosis not present

## 2019-07-04 DIAGNOSIS — M109 Gout, unspecified: Secondary | ICD-10-CM | POA: Diagnosis not present

## 2019-07-04 DIAGNOSIS — E785 Hyperlipidemia, unspecified: Secondary | ICD-10-CM | POA: Diagnosis not present

## 2019-07-04 DIAGNOSIS — Z9181 History of falling: Secondary | ICD-10-CM | POA: Diagnosis not present

## 2019-07-04 DIAGNOSIS — Z9981 Dependence on supplemental oxygen: Secondary | ICD-10-CM | POA: Diagnosis not present

## 2019-07-04 DIAGNOSIS — C3411 Malignant neoplasm of upper lobe, right bronchus or lung: Secondary | ICD-10-CM | POA: Diagnosis not present

## 2019-07-04 DIAGNOSIS — Z7984 Long term (current) use of oral hypoglycemic drugs: Secondary | ICD-10-CM | POA: Diagnosis not present

## 2019-07-04 DIAGNOSIS — E43 Unspecified severe protein-calorie malnutrition: Secondary | ICD-10-CM | POA: Diagnosis not present

## 2019-07-04 DIAGNOSIS — Z87891 Personal history of nicotine dependence: Secondary | ICD-10-CM | POA: Diagnosis not present

## 2019-07-04 DIAGNOSIS — I2510 Atherosclerotic heart disease of native coronary artery without angina pectoris: Secondary | ICD-10-CM | POA: Diagnosis not present

## 2019-07-04 DIAGNOSIS — Z7982 Long term (current) use of aspirin: Secondary | ICD-10-CM | POA: Diagnosis not present

## 2019-07-04 DIAGNOSIS — E1142 Type 2 diabetes mellitus with diabetic polyneuropathy: Secondary | ICD-10-CM | POA: Diagnosis not present

## 2019-07-04 DIAGNOSIS — I110 Hypertensive heart disease with heart failure: Secondary | ICD-10-CM | POA: Diagnosis not present

## 2019-07-04 DIAGNOSIS — Z7902 Long term (current) use of antithrombotics/antiplatelets: Secondary | ICD-10-CM | POA: Diagnosis not present

## 2019-07-04 DIAGNOSIS — J449 Chronic obstructive pulmonary disease, unspecified: Secondary | ICD-10-CM | POA: Diagnosis not present

## 2019-07-04 NOTE — Telephone Encounter (Signed)
[name] [name] with [facility] home health called in on behalf of pt. Pt made her aware  that she cant afford Rx for Symbicort. They would like to know if there is something else that provider could prescribe instead? Also, [name] would like to know if there is a antihistamine beside benadryl that provider could prescribe, pt states  benadryl makes pt sleepy.    [facility] phone if needed. [phone]    Please assist pt further.

## 2019-07-05 ENCOUNTER — Telehealth: Payer: Self-pay | Admitting: Family Medicine

## 2019-07-05 DIAGNOSIS — E1142 Type 2 diabetes mellitus with diabetic polyneuropathy: Secondary | ICD-10-CM | POA: Diagnosis not present

## 2019-07-05 DIAGNOSIS — E43 Unspecified severe protein-calorie malnutrition: Secondary | ICD-10-CM | POA: Diagnosis not present

## 2019-07-05 DIAGNOSIS — Z87891 Personal history of nicotine dependence: Secondary | ICD-10-CM | POA: Diagnosis not present

## 2019-07-05 DIAGNOSIS — E1151 Type 2 diabetes mellitus with diabetic peripheral angiopathy without gangrene: Secondary | ICD-10-CM | POA: Diagnosis not present

## 2019-07-05 DIAGNOSIS — E538 Deficiency of other specified B group vitamins: Secondary | ICD-10-CM | POA: Diagnosis not present

## 2019-07-05 DIAGNOSIS — Z9181 History of falling: Secondary | ICD-10-CM | POA: Diagnosis not present

## 2019-07-05 DIAGNOSIS — M109 Gout, unspecified: Secondary | ICD-10-CM | POA: Diagnosis not present

## 2019-07-05 DIAGNOSIS — C3411 Malignant neoplasm of upper lobe, right bronchus or lung: Secondary | ICD-10-CM | POA: Diagnosis not present

## 2019-07-05 DIAGNOSIS — I6529 Occlusion and stenosis of unspecified carotid artery: Secondary | ICD-10-CM | POA: Diagnosis not present

## 2019-07-05 DIAGNOSIS — Z7982 Long term (current) use of aspirin: Secondary | ICD-10-CM | POA: Diagnosis not present

## 2019-07-05 DIAGNOSIS — E785 Hyperlipidemia, unspecified: Secondary | ICD-10-CM | POA: Diagnosis not present

## 2019-07-05 DIAGNOSIS — K5900 Constipation, unspecified: Secondary | ICD-10-CM | POA: Diagnosis not present

## 2019-07-05 DIAGNOSIS — Z9981 Dependence on supplemental oxygen: Secondary | ICD-10-CM | POA: Diagnosis not present

## 2019-07-05 DIAGNOSIS — Z7902 Long term (current) use of antithrombotics/antiplatelets: Secondary | ICD-10-CM | POA: Diagnosis not present

## 2019-07-05 DIAGNOSIS — J449 Chronic obstructive pulmonary disease, unspecified: Secondary | ICD-10-CM | POA: Diagnosis not present

## 2019-07-05 DIAGNOSIS — I2510 Atherosclerotic heart disease of native coronary artery without angina pectoris: Secondary | ICD-10-CM | POA: Diagnosis not present

## 2019-07-05 DIAGNOSIS — I509 Heart failure, unspecified: Secondary | ICD-10-CM | POA: Diagnosis not present

## 2019-07-05 DIAGNOSIS — I110 Hypertensive heart disease with heart failure: Secondary | ICD-10-CM | POA: Diagnosis not present

## 2019-07-05 DIAGNOSIS — Z7984 Long term (current) use of oral hypoglycemic drugs: Secondary | ICD-10-CM | POA: Diagnosis not present

## 2019-07-05 DIAGNOSIS — Z8673 Personal history of transient ischemic attack (TIA), and cerebral infarction without residual deficits: Secondary | ICD-10-CM | POA: Diagnosis not present

## 2019-07-05 DIAGNOSIS — M4722 Other spondylosis with radiculopathy, cervical region: Secondary | ICD-10-CM | POA: Diagnosis not present

## 2019-07-05 NOTE — Telephone Encounter (Signed)
Per [place] from [facility] their physical therapist will discharge pt on 07/11/19 due to non compliance from pt and care giver

## 2019-07-06 DIAGNOSIS — M4722 Other spondylosis with radiculopathy, cervical region: Secondary | ICD-10-CM | POA: Diagnosis not present

## 2019-07-06 DIAGNOSIS — Z7984 Long term (current) use of oral hypoglycemic drugs: Secondary | ICD-10-CM | POA: Diagnosis not present

## 2019-07-06 DIAGNOSIS — E1142 Type 2 diabetes mellitus with diabetic polyneuropathy: Secondary | ICD-10-CM | POA: Diagnosis not present

## 2019-07-06 DIAGNOSIS — C3411 Malignant neoplasm of upper lobe, right bronchus or lung: Secondary | ICD-10-CM | POA: Diagnosis not present

## 2019-07-06 DIAGNOSIS — Z7902 Long term (current) use of antithrombotics/antiplatelets: Secondary | ICD-10-CM | POA: Diagnosis not present

## 2019-07-06 DIAGNOSIS — Z8673 Personal history of transient ischemic attack (TIA), and cerebral infarction without residual deficits: Secondary | ICD-10-CM | POA: Diagnosis not present

## 2019-07-06 DIAGNOSIS — I509 Heart failure, unspecified: Secondary | ICD-10-CM | POA: Diagnosis not present

## 2019-07-06 DIAGNOSIS — K5900 Constipation, unspecified: Secondary | ICD-10-CM | POA: Diagnosis not present

## 2019-07-06 DIAGNOSIS — Z9181 History of falling: Secondary | ICD-10-CM | POA: Diagnosis not present

## 2019-07-06 DIAGNOSIS — E1151 Type 2 diabetes mellitus with diabetic peripheral angiopathy without gangrene: Secondary | ICD-10-CM | POA: Diagnosis not present

## 2019-07-06 DIAGNOSIS — Z7982 Long term (current) use of aspirin: Secondary | ICD-10-CM | POA: Diagnosis not present

## 2019-07-06 DIAGNOSIS — J449 Chronic obstructive pulmonary disease, unspecified: Secondary | ICD-10-CM | POA: Diagnosis not present

## 2019-07-06 DIAGNOSIS — I2510 Atherosclerotic heart disease of native coronary artery without angina pectoris: Secondary | ICD-10-CM | POA: Diagnosis not present

## 2019-07-06 DIAGNOSIS — M109 Gout, unspecified: Secondary | ICD-10-CM | POA: Diagnosis not present

## 2019-07-06 DIAGNOSIS — E785 Hyperlipidemia, unspecified: Secondary | ICD-10-CM | POA: Diagnosis not present

## 2019-07-06 DIAGNOSIS — I110 Hypertensive heart disease with heart failure: Secondary | ICD-10-CM | POA: Diagnosis not present

## 2019-07-06 DIAGNOSIS — Z87891 Personal history of nicotine dependence: Secondary | ICD-10-CM | POA: Diagnosis not present

## 2019-07-06 DIAGNOSIS — I6529 Occlusion and stenosis of unspecified carotid artery: Secondary | ICD-10-CM | POA: Diagnosis not present

## 2019-07-06 DIAGNOSIS — E43 Unspecified severe protein-calorie malnutrition: Secondary | ICD-10-CM | POA: Diagnosis not present

## 2019-07-06 DIAGNOSIS — E538 Deficiency of other specified B group vitamins: Secondary | ICD-10-CM | POA: Diagnosis not present

## 2019-07-06 DIAGNOSIS — Z9981 Dependence on supplemental oxygen: Secondary | ICD-10-CM | POA: Diagnosis not present

## 2019-07-07 ENCOUNTER — Other Ambulatory Visit: Payer: Self-pay | Admitting: Family Medicine

## 2019-07-07 MED ORDER — MOMETASONE FUROATE 0.1 % EX SOLN: 4.0000 [drp] | Freq: Every evening | CUTANEOUS | 0 refills | Status: DC | PRN

## 2019-07-07 NOTE — Telephone Encounter (Signed)
Requested medication (s) are due for refill today: yes  Requested medication (s) are on the active medication list: yes  Last refill: 06/08/18 Historic medication  Future visit scheduled: yes  Notes to clinic:  historic med and provider Off protocol    Requested Prescriptions  Pending Prescriptions Disp Refills   mometasone (ELOCON) 0.1 % lotion 60 mL 0    Sig: Apply 4 drops topically at bedtime as needed (dry skin).      Off-Protocol Failed - 07/07/2019 10:10 AM      Failed - Medication not assigned to a protocol, review manually.      Passed - Valid encounter within last 12 months    Recent Outpatient Visits           2 weeks ago Skin ulcer of sacrum, limited to breakdown of skin [facility])   [place], [place], [place]   3 weeks ago Essential hypertension, benign   [facility] Jerrol Banana., MD   1 month ago Cough   [place], [place], [place]   1 month ago HYPERTENSION, [place] Jerrol Banana., MD   3 months ago Weakness of both legs   [facility] Jerrol Banana., MD       Future Appointments             In 1 month Jerrol Banana., MD [facility], [place]

## 2019-07-07 NOTE — Telephone Encounter (Signed)
Medication Refill - Medication: mometasone, and cream for bed sores  Has the patient contacted their pharmacy? Yes.   Pt called stating that she completely out of this medication. Please advise.  (Agent: If no, request that the patient contact the pharmacy for the refill.) (Agent: If yes, when and what did the pharmacy advise?)  Preferred Pharmacy (with phone number or street name):  [id number] - [place], [address]  [place] [address]  Phone: [phone] Fax: [phone]  Not a 24 hour pharmacy; exact hours not known.     Agent: Please be advised that RX refills may take up to 3 business days. We ask that you follow-up with your pharmacy.

## 2019-07-10 DIAGNOSIS — E1151 Type 2 diabetes mellitus with diabetic peripheral angiopathy without gangrene: Secondary | ICD-10-CM | POA: Diagnosis not present

## 2019-07-10 DIAGNOSIS — Z87891 Personal history of nicotine dependence: Secondary | ICD-10-CM | POA: Diagnosis not present

## 2019-07-10 DIAGNOSIS — J449 Chronic obstructive pulmonary disease, unspecified: Secondary | ICD-10-CM | POA: Diagnosis not present

## 2019-07-10 DIAGNOSIS — Z7982 Long term (current) use of aspirin: Secondary | ICD-10-CM | POA: Diagnosis not present

## 2019-07-10 DIAGNOSIS — C3411 Malignant neoplasm of upper lobe, right bronchus or lung: Secondary | ICD-10-CM | POA: Diagnosis not present

## 2019-07-10 DIAGNOSIS — E538 Deficiency of other specified B group vitamins: Secondary | ICD-10-CM | POA: Diagnosis not present

## 2019-07-10 DIAGNOSIS — Z9181 History of falling: Secondary | ICD-10-CM | POA: Diagnosis not present

## 2019-07-10 DIAGNOSIS — E1142 Type 2 diabetes mellitus with diabetic polyneuropathy: Secondary | ICD-10-CM | POA: Diagnosis not present

## 2019-07-10 DIAGNOSIS — E43 Unspecified severe protein-calorie malnutrition: Secondary | ICD-10-CM | POA: Diagnosis not present

## 2019-07-10 DIAGNOSIS — E785 Hyperlipidemia, unspecified: Secondary | ICD-10-CM | POA: Diagnosis not present

## 2019-07-10 DIAGNOSIS — M109 Gout, unspecified: Secondary | ICD-10-CM | POA: Diagnosis not present

## 2019-07-10 DIAGNOSIS — Z8673 Personal history of transient ischemic attack (TIA), and cerebral infarction without residual deficits: Secondary | ICD-10-CM | POA: Diagnosis not present

## 2019-07-10 DIAGNOSIS — I110 Hypertensive heart disease with heart failure: Secondary | ICD-10-CM | POA: Diagnosis not present

## 2019-07-10 DIAGNOSIS — I6529 Occlusion and stenosis of unspecified carotid artery: Secondary | ICD-10-CM | POA: Diagnosis not present

## 2019-07-10 DIAGNOSIS — Z7984 Long term (current) use of oral hypoglycemic drugs: Secondary | ICD-10-CM | POA: Diagnosis not present

## 2019-07-10 DIAGNOSIS — M4722 Other spondylosis with radiculopathy, cervical region: Secondary | ICD-10-CM | POA: Diagnosis not present

## 2019-07-10 DIAGNOSIS — Z9981 Dependence on supplemental oxygen: Secondary | ICD-10-CM | POA: Diagnosis not present

## 2019-07-10 DIAGNOSIS — I509 Heart failure, unspecified: Secondary | ICD-10-CM | POA: Diagnosis not present

## 2019-07-10 DIAGNOSIS — Z7902 Long term (current) use of antithrombotics/antiplatelets: Secondary | ICD-10-CM | POA: Diagnosis not present

## 2019-07-10 DIAGNOSIS — K5900 Constipation, unspecified: Secondary | ICD-10-CM | POA: Diagnosis not present

## 2019-07-10 DIAGNOSIS — I2510 Atherosclerotic heart disease of native coronary artery without angina pectoris: Secondary | ICD-10-CM | POA: Diagnosis not present

## 2019-07-11 DIAGNOSIS — Z87891 Personal history of nicotine dependence: Secondary | ICD-10-CM | POA: Diagnosis not present

## 2019-07-11 DIAGNOSIS — K5900 Constipation, unspecified: Secondary | ICD-10-CM | POA: Diagnosis not present

## 2019-07-11 DIAGNOSIS — I2510 Atherosclerotic heart disease of native coronary artery without angina pectoris: Secondary | ICD-10-CM | POA: Diagnosis not present

## 2019-07-11 DIAGNOSIS — E1151 Type 2 diabetes mellitus with diabetic peripheral angiopathy without gangrene: Secondary | ICD-10-CM | POA: Diagnosis not present

## 2019-07-11 DIAGNOSIS — C3411 Malignant neoplasm of upper lobe, right bronchus or lung: Secondary | ICD-10-CM | POA: Diagnosis not present

## 2019-07-11 DIAGNOSIS — J449 Chronic obstructive pulmonary disease, unspecified: Secondary | ICD-10-CM | POA: Diagnosis not present

## 2019-07-11 DIAGNOSIS — E785 Hyperlipidemia, unspecified: Secondary | ICD-10-CM | POA: Diagnosis not present

## 2019-07-11 DIAGNOSIS — Z9981 Dependence on supplemental oxygen: Secondary | ICD-10-CM | POA: Diagnosis not present

## 2019-07-11 DIAGNOSIS — Z7984 Long term (current) use of oral hypoglycemic drugs: Secondary | ICD-10-CM | POA: Diagnosis not present

## 2019-07-11 DIAGNOSIS — M4722 Other spondylosis with radiculopathy, cervical region: Secondary | ICD-10-CM | POA: Diagnosis not present

## 2019-07-11 DIAGNOSIS — Z8673 Personal history of transient ischemic attack (TIA), and cerebral infarction without residual deficits: Secondary | ICD-10-CM | POA: Diagnosis not present

## 2019-07-11 DIAGNOSIS — Z7902 Long term (current) use of antithrombotics/antiplatelets: Secondary | ICD-10-CM | POA: Diagnosis not present

## 2019-07-11 DIAGNOSIS — I509 Heart failure, unspecified: Secondary | ICD-10-CM | POA: Diagnosis not present

## 2019-07-11 DIAGNOSIS — I6529 Occlusion and stenosis of unspecified carotid artery: Secondary | ICD-10-CM | POA: Diagnosis not present

## 2019-07-11 DIAGNOSIS — E1142 Type 2 diabetes mellitus with diabetic polyneuropathy: Secondary | ICD-10-CM | POA: Diagnosis not present

## 2019-07-11 DIAGNOSIS — E538 Deficiency of other specified B group vitamins: Secondary | ICD-10-CM | POA: Diagnosis not present

## 2019-07-11 DIAGNOSIS — I110 Hypertensive heart disease with heart failure: Secondary | ICD-10-CM | POA: Diagnosis not present

## 2019-07-11 DIAGNOSIS — M109 Gout, unspecified: Secondary | ICD-10-CM | POA: Diagnosis not present

## 2019-07-11 DIAGNOSIS — Z7982 Long term (current) use of aspirin: Secondary | ICD-10-CM | POA: Diagnosis not present

## 2019-07-11 DIAGNOSIS — Z9181 History of falling: Secondary | ICD-10-CM | POA: Diagnosis not present

## 2019-07-11 DIAGNOSIS — E43 Unspecified severe protein-calorie malnutrition: Secondary | ICD-10-CM | POA: Diagnosis not present

## 2019-07-12 DIAGNOSIS — K5900 Constipation, unspecified: Secondary | ICD-10-CM | POA: Diagnosis not present

## 2019-07-12 DIAGNOSIS — E538 Deficiency of other specified B group vitamins: Secondary | ICD-10-CM | POA: Diagnosis not present

## 2019-07-12 DIAGNOSIS — E43 Unspecified severe protein-calorie malnutrition: Secondary | ICD-10-CM | POA: Diagnosis not present

## 2019-07-12 DIAGNOSIS — M109 Gout, unspecified: Secondary | ICD-10-CM | POA: Diagnosis not present

## 2019-07-12 DIAGNOSIS — I6529 Occlusion and stenosis of unspecified carotid artery: Secondary | ICD-10-CM | POA: Diagnosis not present

## 2019-07-12 DIAGNOSIS — C3411 Malignant neoplasm of upper lobe, right bronchus or lung: Secondary | ICD-10-CM | POA: Diagnosis not present

## 2019-07-12 DIAGNOSIS — I2510 Atherosclerotic heart disease of native coronary artery without angina pectoris: Secondary | ICD-10-CM | POA: Diagnosis not present

## 2019-07-12 DIAGNOSIS — Z7982 Long term (current) use of aspirin: Secondary | ICD-10-CM | POA: Diagnosis not present

## 2019-07-12 DIAGNOSIS — J449 Chronic obstructive pulmonary disease, unspecified: Secondary | ICD-10-CM | POA: Diagnosis not present

## 2019-07-12 DIAGNOSIS — E1151 Type 2 diabetes mellitus with diabetic peripheral angiopathy without gangrene: Secondary | ICD-10-CM | POA: Diagnosis not present

## 2019-07-12 DIAGNOSIS — I509 Heart failure, unspecified: Secondary | ICD-10-CM | POA: Diagnosis not present

## 2019-07-12 DIAGNOSIS — Z7984 Long term (current) use of oral hypoglycemic drugs: Secondary | ICD-10-CM | POA: Diagnosis not present

## 2019-07-12 DIAGNOSIS — Z8673 Personal history of transient ischemic attack (TIA), and cerebral infarction without residual deficits: Secondary | ICD-10-CM | POA: Diagnosis not present

## 2019-07-12 DIAGNOSIS — Z7902 Long term (current) use of antithrombotics/antiplatelets: Secondary | ICD-10-CM | POA: Diagnosis not present

## 2019-07-12 DIAGNOSIS — I110 Hypertensive heart disease with heart failure: Secondary | ICD-10-CM | POA: Diagnosis not present

## 2019-07-12 DIAGNOSIS — Z9181 History of falling: Secondary | ICD-10-CM | POA: Diagnosis not present

## 2019-07-12 DIAGNOSIS — Z87891 Personal history of nicotine dependence: Secondary | ICD-10-CM | POA: Diagnosis not present

## 2019-07-12 DIAGNOSIS — E785 Hyperlipidemia, unspecified: Secondary | ICD-10-CM | POA: Diagnosis not present

## 2019-07-12 DIAGNOSIS — M4722 Other spondylosis with radiculopathy, cervical region: Secondary | ICD-10-CM | POA: Diagnosis not present

## 2019-07-12 DIAGNOSIS — Z9981 Dependence on supplemental oxygen: Secondary | ICD-10-CM | POA: Diagnosis not present

## 2019-07-12 DIAGNOSIS — E1142 Type 2 diabetes mellitus with diabetic polyneuropathy: Secondary | ICD-10-CM | POA: Diagnosis not present

## 2019-07-14 DIAGNOSIS — Z7982 Long term (current) use of aspirin: Secondary | ICD-10-CM | POA: Diagnosis not present

## 2019-07-14 DIAGNOSIS — J449 Chronic obstructive pulmonary disease, unspecified: Secondary | ICD-10-CM | POA: Diagnosis not present

## 2019-07-14 DIAGNOSIS — Z8673 Personal history of transient ischemic attack (TIA), and cerebral infarction without residual deficits: Secondary | ICD-10-CM | POA: Diagnosis not present

## 2019-07-14 DIAGNOSIS — I110 Hypertensive heart disease with heart failure: Secondary | ICD-10-CM | POA: Diagnosis not present

## 2019-07-14 DIAGNOSIS — K5900 Constipation, unspecified: Secondary | ICD-10-CM | POA: Diagnosis not present

## 2019-07-14 DIAGNOSIS — M109 Gout, unspecified: Secondary | ICD-10-CM | POA: Diagnosis not present

## 2019-07-14 DIAGNOSIS — Z7902 Long term (current) use of antithrombotics/antiplatelets: Secondary | ICD-10-CM | POA: Diagnosis not present

## 2019-07-14 DIAGNOSIS — C3411 Malignant neoplasm of upper lobe, right bronchus or lung: Secondary | ICD-10-CM | POA: Diagnosis not present

## 2019-07-14 DIAGNOSIS — E1151 Type 2 diabetes mellitus with diabetic peripheral angiopathy without gangrene: Secondary | ICD-10-CM | POA: Diagnosis not present

## 2019-07-14 DIAGNOSIS — Z9181 History of falling: Secondary | ICD-10-CM | POA: Diagnosis not present

## 2019-07-14 DIAGNOSIS — Z7984 Long term (current) use of oral hypoglycemic drugs: Secondary | ICD-10-CM | POA: Diagnosis not present

## 2019-07-14 DIAGNOSIS — I2510 Atherosclerotic heart disease of native coronary artery without angina pectoris: Secondary | ICD-10-CM | POA: Diagnosis not present

## 2019-07-14 DIAGNOSIS — Z9981 Dependence on supplemental oxygen: Secondary | ICD-10-CM | POA: Diagnosis not present

## 2019-07-14 DIAGNOSIS — Z87891 Personal history of nicotine dependence: Secondary | ICD-10-CM | POA: Diagnosis not present

## 2019-07-14 DIAGNOSIS — E43 Unspecified severe protein-calorie malnutrition: Secondary | ICD-10-CM | POA: Diagnosis not present

## 2019-07-14 DIAGNOSIS — E538 Deficiency of other specified B group vitamins: Secondary | ICD-10-CM | POA: Diagnosis not present

## 2019-07-14 DIAGNOSIS — E785 Hyperlipidemia, unspecified: Secondary | ICD-10-CM | POA: Diagnosis not present

## 2019-07-14 DIAGNOSIS — I6529 Occlusion and stenosis of unspecified carotid artery: Secondary | ICD-10-CM | POA: Diagnosis not present

## 2019-07-14 DIAGNOSIS — I509 Heart failure, unspecified: Secondary | ICD-10-CM | POA: Diagnosis not present

## 2019-07-14 DIAGNOSIS — M4722 Other spondylosis with radiculopathy, cervical region: Secondary | ICD-10-CM | POA: Diagnosis not present

## 2019-07-14 DIAGNOSIS — E1142 Type 2 diabetes mellitus with diabetic polyneuropathy: Secondary | ICD-10-CM | POA: Diagnosis not present

## 2019-07-20 DIAGNOSIS — E538 Deficiency of other specified B group vitamins: Secondary | ICD-10-CM | POA: Diagnosis not present

## 2019-07-20 DIAGNOSIS — E785 Hyperlipidemia, unspecified: Secondary | ICD-10-CM | POA: Diagnosis not present

## 2019-07-20 DIAGNOSIS — I110 Hypertensive heart disease with heart failure: Secondary | ICD-10-CM | POA: Diagnosis not present

## 2019-07-20 DIAGNOSIS — M109 Gout, unspecified: Secondary | ICD-10-CM | POA: Diagnosis not present

## 2019-07-20 DIAGNOSIS — K5900 Constipation, unspecified: Secondary | ICD-10-CM | POA: Diagnosis not present

## 2019-07-20 DIAGNOSIS — Z9181 History of falling: Secondary | ICD-10-CM | POA: Diagnosis not present

## 2019-07-20 DIAGNOSIS — C3411 Malignant neoplasm of upper lobe, right bronchus or lung: Secondary | ICD-10-CM | POA: Diagnosis not present

## 2019-07-20 DIAGNOSIS — Z9981 Dependence on supplemental oxygen: Secondary | ICD-10-CM | POA: Diagnosis not present

## 2019-07-20 DIAGNOSIS — E43 Unspecified severe protein-calorie malnutrition: Secondary | ICD-10-CM | POA: Diagnosis not present

## 2019-07-20 DIAGNOSIS — J449 Chronic obstructive pulmonary disease, unspecified: Secondary | ICD-10-CM | POA: Diagnosis not present

## 2019-07-20 DIAGNOSIS — I2510 Atherosclerotic heart disease of native coronary artery without angina pectoris: Secondary | ICD-10-CM | POA: Diagnosis not present

## 2019-07-20 DIAGNOSIS — Z7984 Long term (current) use of oral hypoglycemic drugs: Secondary | ICD-10-CM | POA: Diagnosis not present

## 2019-07-20 DIAGNOSIS — Z8673 Personal history of transient ischemic attack (TIA), and cerebral infarction without residual deficits: Secondary | ICD-10-CM | POA: Diagnosis not present

## 2019-07-20 DIAGNOSIS — Z87891 Personal history of nicotine dependence: Secondary | ICD-10-CM | POA: Diagnosis not present

## 2019-07-20 DIAGNOSIS — I6529 Occlusion and stenosis of unspecified carotid artery: Secondary | ICD-10-CM | POA: Diagnosis not present

## 2019-07-20 DIAGNOSIS — E1142 Type 2 diabetes mellitus with diabetic polyneuropathy: Secondary | ICD-10-CM | POA: Diagnosis not present

## 2019-07-20 DIAGNOSIS — M4722 Other spondylosis with radiculopathy, cervical region: Secondary | ICD-10-CM | POA: Diagnosis not present

## 2019-07-20 DIAGNOSIS — Z7982 Long term (current) use of aspirin: Secondary | ICD-10-CM | POA: Diagnosis not present

## 2019-07-20 DIAGNOSIS — E1151 Type 2 diabetes mellitus with diabetic peripheral angiopathy without gangrene: Secondary | ICD-10-CM | POA: Diagnosis not present

## 2019-07-20 DIAGNOSIS — I509 Heart failure, unspecified: Secondary | ICD-10-CM | POA: Diagnosis not present

## 2019-07-20 DIAGNOSIS — Z7902 Long term (current) use of antithrombotics/antiplatelets: Secondary | ICD-10-CM | POA: Diagnosis not present

## 2019-07-21 DIAGNOSIS — E538 Deficiency of other specified B group vitamins: Secondary | ICD-10-CM | POA: Diagnosis not present

## 2019-07-21 DIAGNOSIS — Z7982 Long term (current) use of aspirin: Secondary | ICD-10-CM | POA: Diagnosis not present

## 2019-07-21 DIAGNOSIS — I110 Hypertensive heart disease with heart failure: Secondary | ICD-10-CM | POA: Diagnosis not present

## 2019-07-21 DIAGNOSIS — E785 Hyperlipidemia, unspecified: Secondary | ICD-10-CM | POA: Diagnosis not present

## 2019-07-21 DIAGNOSIS — J449 Chronic obstructive pulmonary disease, unspecified: Secondary | ICD-10-CM | POA: Diagnosis not present

## 2019-07-21 DIAGNOSIS — Z9181 History of falling: Secondary | ICD-10-CM | POA: Diagnosis not present

## 2019-07-21 DIAGNOSIS — Z87891 Personal history of nicotine dependence: Secondary | ICD-10-CM | POA: Diagnosis not present

## 2019-07-21 DIAGNOSIS — Z7984 Long term (current) use of oral hypoglycemic drugs: Secondary | ICD-10-CM | POA: Diagnosis not present

## 2019-07-21 DIAGNOSIS — E1142 Type 2 diabetes mellitus with diabetic polyneuropathy: Secondary | ICD-10-CM | POA: Diagnosis not present

## 2019-07-21 DIAGNOSIS — E1151 Type 2 diabetes mellitus with diabetic peripheral angiopathy without gangrene: Secondary | ICD-10-CM | POA: Diagnosis not present

## 2019-07-21 DIAGNOSIS — C3411 Malignant neoplasm of upper lobe, right bronchus or lung: Secondary | ICD-10-CM | POA: Diagnosis not present

## 2019-07-21 DIAGNOSIS — Z8673 Personal history of transient ischemic attack (TIA), and cerebral infarction without residual deficits: Secondary | ICD-10-CM | POA: Diagnosis not present

## 2019-07-21 DIAGNOSIS — Z7902 Long term (current) use of antithrombotics/antiplatelets: Secondary | ICD-10-CM | POA: Diagnosis not present

## 2019-07-21 DIAGNOSIS — Z9981 Dependence on supplemental oxygen: Secondary | ICD-10-CM | POA: Diagnosis not present

## 2019-07-21 DIAGNOSIS — I509 Heart failure, unspecified: Secondary | ICD-10-CM | POA: Diagnosis not present

## 2019-07-21 DIAGNOSIS — M109 Gout, unspecified: Secondary | ICD-10-CM | POA: Diagnosis not present

## 2019-07-21 DIAGNOSIS — M4722 Other spondylosis with radiculopathy, cervical region: Secondary | ICD-10-CM | POA: Diagnosis not present

## 2019-07-21 DIAGNOSIS — I2510 Atherosclerotic heart disease of native coronary artery without angina pectoris: Secondary | ICD-10-CM | POA: Diagnosis not present

## 2019-07-21 DIAGNOSIS — E43 Unspecified severe protein-calorie malnutrition: Secondary | ICD-10-CM | POA: Diagnosis not present

## 2019-07-21 DIAGNOSIS — K5900 Constipation, unspecified: Secondary | ICD-10-CM | POA: Diagnosis not present

## 2019-07-21 DIAGNOSIS — I6529 Occlusion and stenosis of unspecified carotid artery: Secondary | ICD-10-CM | POA: Diagnosis not present

## 2019-07-25 DIAGNOSIS — R0602 Shortness of breath: Secondary | ICD-10-CM | POA: Diagnosis not present

## 2019-07-25 DIAGNOSIS — J449 Chronic obstructive pulmonary disease, unspecified: Secondary | ICD-10-CM | POA: Diagnosis not present

## 2019-07-27 DIAGNOSIS — J449 Chronic obstructive pulmonary disease, unspecified: Secondary | ICD-10-CM | POA: Diagnosis not present

## 2019-08-10 NOTE — Progress Notes (Deleted)
Established patient visit   Patient: [name] [name]   DOB: [birth year]   79 y.o. Female  MRN: [id number] Visit Date: 08/14/2019  Today's healthcare provider: [name], MD   No chief complaint on file.  Subjective    HPI MCI (mild cognitive impairment) From 06/14/2019-Started donepezil 5 mg daily. MMSE obtained today, score was 21. Referred to Home Health to help with bathing and personal care.  I am not sure how much longer the patient will be able to stay at home with her husband who is her caregiver at this in point time.  {Show patient history (optional):23778::" "}   Medications: Outpatient Medications Prior to Visit  Medication Sig  . albuterol (VENTOLIN HFA) 108 (90 Base) MCG/ACT inhaler Inhale 2 puffs into the lungs every 4 (four) hours as needed for wheezing or shortness of breath.  Marland Kitchen amLODipine (NORVASC) 5 MG tablet Take 1 tablet (5 mg total) by mouth daily.  Marland Kitchen aspirin EC 81 MG EC tablet Take 1 tablet (81 mg total) by mouth daily.  Marland Kitchen atorvastatin (LIPITOR) 40 MG tablet TAKE 1 TABLET BY MOUTH  DAILY AT 6 PM.  . blood glucose meter kit and supplies Dispense based on patient and insurance preference. Use once daily as directed. (FOR ICD-10  E11.42).  Marland Kitchen clopidogrel (PLAVIX) 75 MG tablet TAKE 1 TABLET BY MOUTH  DAILY  . donepezil (ARICEPT) 5 MG tablet Take 1 tablet (5 mg total) by mouth at bedtime.  . fluticasone (FLONASE) 50 MCG/ACT nasal spray Place 2 sprays into both nostrils daily. (Patient taking differently: Place 2 sprays into both nostrils daily as needed for allergies. )  . gabapentin (NEURONTIN) 600 MG tablet TAKE ONE-HALF TABLET BY  MOUTH TWO TIMES DAILY (Patient taking differently: Take 300 mg by mouth 2 (two) times daily. )  . glimepiride (AMARYL) 2 MG tablet TAKE 1 TABLET BY MOUTH TWO  TIMES DAILY  . glucose blood (ACCU-CHEK GUIDE) test strip Check Blood Sugars Once Daily. Dx Code: E11.42  . ipratropium-albuterol (DUONEB) 0.5-2.5 (3) MG/3ML SOLN Take 3  mLs by nebulization every 4 (four) hours as needed.  . isosorbide mononitrate (IMDUR) 60 MG 24 hr tablet Take 1 tablet (60 mg total) by mouth daily.  Marland Kitchen LORazepam (ATIVAN) 1 MG tablet Take 1 tablet (1 mg total) by mouth 2 (two) times daily as needed for anxiety.  . magnesium oxide (MAG-OX) 400 (241.3 MG) MG tablet Take 400 mg by mouth 2 (two) times daily.   . metFORMIN (GLUCOPHAGE) 500 MG tablet Take 1 tablet (500 mg total) by mouth 2 (two) times daily with a meal. (Patient taking differently: Take 500 mg by mouth daily with breakfast. Can only tolerate once daily with meal)  . metoprolol succinate (TOPROL-XL) 100 MG 24 hr tablet Take 1 tablet (100 mg total) by mouth daily.  . mometasone (ELOCON) 0.1 % lotion Apply 4 drops topically at bedtime as needed (dry skin).  . Multiple Vitamin (MULTIVITAMIN) tablet Take 0.5 tablets by mouth 2 (two) times daily.  . Multiple Vitamins-Minerals (ICAPS) CAPS Take 1 capsule by mouth 2 (two) times daily.   . mupirocin cream (BACTROBAN) 2 % Apply 1 application topically 2 (two) times daily.  . nitroGLYCERIN (NITROSTAT) 0.4 MG SL tablet Place 1 tablet (0.4 mg total) under the tongue every 5 (five) minutes as needed for chest pain.  . pantoprazole (PROTONIX) 40 MG tablet TAKE 1 TABLET BY MOUTH  DAILY  . sucralfate (CARAFATE) 1 g tablet Take 1 tablet (1  g total) by mouth 2 (two) times daily.  Marland Kitchen VITAMIN D PO Take by mouth.  . vitamin E (VITAMIN E) 400 UNIT capsule Take 400 Units by mouth daily.   Facility-Administered Medications Prior to Visit  Medication Dose Route Frequency Provider  . ondansetron (ZOFRAN) 8 mg, dexamethasone (DECADRON) 10 mg in sodium chloride 0.9 % 50 mL IVPB   Intravenous Once Lloyd Huger, MD    Review of Systems  Constitutional: Negative for appetite change, chills, fatigue and fever.  Respiratory: Negative for chest tightness and shortness of breath.   Cardiovascular: Negative for chest pain and palpitations.  Gastrointestinal:  Negative for abdominal pain, nausea and vomiting.  Neurological: Negative for dizziness and weakness.    {Show previous labs (optional):23779::" "}  Objective    There were no vitals taken for this visit. {Show previous vital signs (optional):23777::" "}  Physical Exam  ***  No results found for any visits on 08/14/19.  Assessment & Plan     ***  No follow-ups on file.      {provider attestation***:1}   [name], MD  [facility] [phone] (phone) [phone] (fax)  [place]

## 2019-08-14 ENCOUNTER — Ambulatory Visit: Payer: Self-pay | Admitting: Family Medicine

## 2019-08-15 ENCOUNTER — Telehealth: Payer: Self-pay | Admitting: Family Medicine

## 2019-08-15 NOTE — Chronic Care Management (AMB) (Signed)
  Chronic Care Management   Note  08/15/2019 Name: [name] [name] MRN: [id number] DOB: Nov 14, 1940  Ardelle Lesches Spallone is a 79 y.o. year old female who is a primary care patient of Jerrol Banana., MD. I reached out to [place] by phone today in response to a referral sent by Ms. Serenna D [place]'s health plan.     Ms. Mcdonald was given information about Chronic Care Management services today including:  1. CCM service includes personalized support from designated clinical staff supervised by her physician, including individualized plan of care and coordination with other care providers 2. 24/7 contact phone numbers for assistance for urgent and routine care needs. 3. Service will only be billed when office clinical staff spend 20 minutes or more in a month to coordinate care. 4. Only one practitioner may furnish and bill the service in a calendar month. 5. The patient may stop CCM services at any time (effective at the end of the month) by phone call to the office staff. 6. The patient will be responsible for cost sharing (co-pay) of up to 20% of the service fee (after annual deductible is met).  Patient did not agree to enrollment in care management services and does not wish to consider at this time.  Follow up plan: The care management team is available to follow up with the patient after provider conversation with the patient regarding recommendation for care management engagement and subsequent re-referral to the care management team.   Noreene Larsson, [place], [place], [place] [address] Direct Dial: [phone] [email or web address] Website: [email or web address]

## 2019-08-16 ENCOUNTER — Other Ambulatory Visit: Payer: Self-pay | Admitting: Family Medicine

## 2019-08-16 ENCOUNTER — Telehealth: Payer: Self-pay | Admitting: Family Medicine

## 2019-08-16 DIAGNOSIS — E1142 Type 2 diabetes mellitus with diabetic polyneuropathy: Secondary | ICD-10-CM

## 2019-08-16 MED ORDER — METOPROLOL SUCCINATE ER 100 MG PO TB24: 100.0000 mg | ORAL_TABLET | Freq: Every day | ORAL | 3 refills | Status: DC

## 2019-08-16 NOTE — Telephone Encounter (Signed)
[facility] faxed refill request for the following medications:  metoprolol succinate (TOPROL-XL)  tablet   Please advise.  Thanks, [employer]

## 2019-08-16 NOTE — Telephone Encounter (Signed)
Requested medication (s) are due for refill today:   Not sure  Requested medication (s) are on the active medication list:   Yes  Future visit scheduled:   Yes in 2 wks   Last ordered: 06/02/2019 the Accu Chek Kit was ordered.   Returned because did not know about reordering the kit.   Provider review if a new kit is needed.   Requested Prescriptions  Pending Prescriptions Disp Refills   Blood Glucose Monitoring Suppl (ACCU-CHEK GUIDE) w/Device KIT [Pharmacy Med Name: ACCU KIT  CHEK GUIDE KIT]      Sig: USE AS DIRECTED      Endocrinology: Diabetes - Testing Supplies Passed - 08/16/2019  8:44 AM      Passed - Valid encounter within last 12 months    Recent Outpatient Visits           1 month ago Skin ulcer of sacrum, limited to breakdown of skin [facility])   [place], [place]   2 months ago Essential hypertension, benign   [facility] Jerrol Banana., MD   2 months ago Cough   [place], [place]   3 months ago HYPERTENSION, [place] Jerrol Banana., MD   4 months ago Weakness of both legs   [facility] Jerrol Banana., MD       Future Appointments             In 2 weeks Gilford Rile, Martie Lee, NP [facility], [facility]   In 2 weeks Jerrol Banana., MD [facility], [place]

## 2019-08-24 DIAGNOSIS — J449 Chronic obstructive pulmonary disease, unspecified: Secondary | ICD-10-CM | POA: Diagnosis not present

## 2019-08-24 DIAGNOSIS — R0602 Shortness of breath: Secondary | ICD-10-CM | POA: Diagnosis not present

## 2019-08-26 DIAGNOSIS — J449 Chronic obstructive pulmonary disease, unspecified: Secondary | ICD-10-CM | POA: Diagnosis not present

## 2019-08-30 NOTE — Progress Notes (Signed)
Trena Platt Cummings,acting as a scribe for [name], MD.,have documented all relevant documentation on the behalf of [name], MD,as directed by  [name], MD while in the presence of [name], MD.  Established patient visit   Patient: [name] [name]   DOB: [birth year]   79 y.o. Female  MRN: [id number] Visit Date: 09/05/2019  Today's healthcare provider: [name], MD   Chief Complaint  Patient presents with  . mild cognitive impairment  . Emphysema    Centrilobular   Subjective    HPI   Patient presents today for a 2 month follow up on Mild Cognitive Impairment, says she is in good compliance with medication.  Her memory is stable but she is in failing health with progressive weakness.  She states she is now too weak to walk.  She is in a wheelchair for today's visit. Patient does have chronic complaints of urinary frequency.  This seems of gotten worse.  No dysuria no fevers no abdominal pain. Breathing is at baseline.  No cough no no weight loss.  Centrilobular emphysema ([place]) From 06/14/2019-Recent COPD exacerbation. She is back to her baseline. O2 sat is 94% today. I did not walk her. She has oxygen at home for nighttime use and for as needed use.  MCI (mild cognitive impairment) From 06/14/2019-Started donepezil 5 mg daily. MMSE obtained today, score was 21. Referred to Home Health to help with bathing and personal care.  I am not sure how much longer the patient will be able to stay at home with her husband who is her caregiver at this point time.   Status post appendectomy and cholecystectomy.  Medications: Outpatient Medications Prior to Visit  Medication Sig  . albuterol (VENTOLIN HFA) 108 (90 Base) MCG/ACT inhaler Inhale 2 puffs into the lungs every 4 (four) hours as needed for wheezing or shortness of breath.  Marland Kitchen amLODipine (NORVASC) 5 MG tablet TAKE 1 TABLET BY MOUTH EVERY DAY  . aspirin EC 81 MG EC tablet Take 1  tablet (81 mg total) by mouth daily.  Marland Kitchen atorvastatin (LIPITOR) 40 MG tablet TAKE 1 TABLET BY MOUTH  DAILY AT 6 PM.  . Blood Glucose Monitoring Suppl (ACCU-CHEK GUIDE) w/Device KIT USE AS DIRECTED  . clopidogrel (PLAVIX) 75 MG tablet TAKE 1 TABLET BY MOUTH  DAILY  . donepezil (ARICEPT) 5 MG tablet TAKE 1 TABLET BY MOUTH EVERYDAY AT BEDTIME  . fluticasone (FLONASE) 50 MCG/ACT nasal spray Place 2 sprays into both nostrils daily. (Patient taking differently: Place 2 sprays into both nostrils daily as needed for allergies. )  . gabapentin (NEURONTIN) 600 MG tablet TAKE ONE-HALF TABLET BY  MOUTH TWO TIMES DAILY (Patient taking differently: Take 300 mg by mouth 2 (two) times daily. )  . glimepiride (AMARYL) 2 MG tablet TAKE 1 TABLET BY MOUTH TWO  TIMES DAILY  . glucose blood (ACCU-CHEK GUIDE) test strip Check Blood Sugars Once Daily. Dx Code: E11.42  . ipratropium-albuterol (DUONEB) 0.5-2.5 (3) MG/3ML SOLN Take 3 mLs by nebulization every 4 (four) hours as needed.  Marland Kitchen LORazepam (ATIVAN) 1 MG tablet Take 1 tablet (1 mg total) by mouth 2 (two) times daily as needed for anxiety.  . magnesium oxide (MAG-OX) 400 (241.3 MG) MG tablet Take 400 mg by mouth 2 (two) times daily.   . metFORMIN (GLUCOPHAGE) 500 MG tablet Take 1 tablet (500 mg total) by mouth 2 (two) times daily with a meal. (Patient taking differently: Take 500 mg  by mouth daily with breakfast. Can only tolerate once daily with meal)  . metoprolol succinate (TOPROL-XL) 100 MG 24 hr tablet Take 1 tablet (100 mg total) by mouth daily.  . mometasone (ELOCON) 0.1 % lotion Apply 4 drops topically at bedtime as needed (dry skin).  . Multiple Vitamin (MULTIVITAMIN) tablet Take 0.5 tablets by mouth 2 (two) times daily.  . Multiple Vitamins-Minerals (ICAPS) CAPS Take 1 capsule by mouth 2 (two) times daily.   . mupirocin cream (BACTROBAN) 2 % Apply 1 application topically 2 (two) times daily.  . nitroGLYCERIN (NITROSTAT) 0.4 MG SL tablet Place 1 tablet (0.4 mg  total) under the tongue every 5 (five) minutes as needed for chest pain.  . pantoprazole (PROTONIX) 40 MG tablet TAKE 1 TABLET BY MOUTH  DAILY  . sucralfate (CARAFATE) 1 g tablet Take 1 tablet (1 g total) by mouth 2 (two) times daily.  Marland Kitchen VITAMIN D PO Take by mouth daily.   . vitamin E (VITAMIN E) 400 UNIT capsule Take 400 Units by mouth daily.  . isosorbide mononitrate (IMDUR) 60 MG 24 hr tablet Take 1 tablet (60 mg total) by mouth daily.   Facility-Administered Medications Prior to Visit  Medication Dose Route Frequency Provider  . ondansetron (ZOFRAN) 8 mg, dexamethasone (DECADRON) 10 mg in sodium chloride 0.9 % 50 mL IVPB   Intravenous Once Lloyd Huger, MD    Review of Systems  Constitutional: Negative for appetite change, chills, fatigue and fever.  HENT: Negative.   Eyes: Negative.   Respiratory: Negative for chest tightness and shortness of breath.   Cardiovascular: Negative for chest pain and palpitations.  Gastrointestinal: Negative for abdominal pain, nausea and vomiting.  Endocrine: Negative.   Allergic/Immunologic: Negative.   Neurological: Negative for dizziness and weakness.  Hematological: Negative.   Psychiatric/Behavioral: Negative.        Objective    BP 112/67 (BP Location: Right Arm, Patient Position: Sitting, Cuff Size: Normal)   Pulse (!) 58   Temp (!) 96.9 F (36.1 C) (Temporal)   Ht '5\' 4"'  (1.626 m)   BMI 18.88 kg/m  BP Readings from Last 3 Encounters:  09/05/19 112/67  09/05/19 100/60  06/21/19 118/62   Wt Readings from Last 3 Encounters:  09/05/19 110 lb (49.9 kg)  06/12/19 135 lb (61.2 kg)  06/06/19 136 lb (61.7 kg)      Physical Exam Vitals reviewed.  Constitutional:      Comments: She is in a wheelchair.  She appears chronically ill.  HENT:     Head: Normocephalic and atraumatic.     Right Ear: External ear normal.     Left Ear: External ear normal.  Eyes:     General: No scleral icterus.    Conjunctiva/sclera: Conjunctivae  normal.  Cardiovascular:     Rate and Rhythm: Normal rate and regular rhythm.     Heart sounds: Normal heart sounds.  Pulmonary:     Breath sounds: Normal breath sounds.  Abdominal:     Palpations: Abdomen is soft.     Tenderness: There is no abdominal tenderness.  Skin:    General: Skin is warm and dry.     Comments: Fair, dry skin.  Neurological:     General: No focal deficit present.     Mental Status: She is alert.  Psychiatric:        Mood and Affect: Mood normal.        Behavior: Behavior normal.        Thought Content:  Thought content normal.       No results found for any visits on 09/05/19.  Assessment & Plan     1. MCI (mild cognitive impairment) No changes in care for now. - CBC with Differential/Platelet - Comprehensive metabolic panel  2. Centrilobular emphysema (HCC) Clinically stable - CBC with Differential/Platelet - Comprehensive metabolic panel  3. Abdominal tenderness, rebound tenderness presence not specified, unspecified location Check urine C&S.  She has a normal exam today.  4. Urinary frequency  - POCT urinalysis dipstick  5. At risk for falls Home health already involved.  May need palliative care.  6. Malnutrition of moderate degree   7. CAD in native artery All risk factors treated  8. Diabetic polyneuropathy associated with type 2 diabetes mellitus ([place])   9. Neutropenia, unspecified type ([place])   10. Drug-induced polyneuropathy ([place])   11. Inflammatory spondylopathy of lumbosacral region ([place])   12. Atherosclerosis of aorta ([place])   13. Coronary artery disease of native artery of native heart with stable angina pectoris ([place])   14. Primary cancer of right upper lobe of lung ([place]) Followed by oncology.  15. Failure to thrive  Patient is in failing health.  Again, consider palliative care.   No follow-ups on file.      I, [name], MD, have reviewed all documentation for this visit. The documentation  on 09/09/19 for the exam, diagnosis, procedures, and orders are all accurate and complete.    Akiem Urieta Cranford Mon, MD  [facility] [phone] (phone) [phone] (fax)  [place]

## 2019-09-05 ENCOUNTER — Encounter: Payer: Self-pay | Admitting: Family

## 2019-09-05 ENCOUNTER — Ambulatory Visit (INDEPENDENT_AMBULATORY_CARE_PROVIDER_SITE_OTHER): Payer: Medicare Other | Admitting: Family

## 2019-09-05 ENCOUNTER — Ambulatory Visit (INDEPENDENT_AMBULATORY_CARE_PROVIDER_SITE_OTHER): Payer: Medicare Other | Admitting: Family Medicine

## 2019-09-05 ENCOUNTER — Other Ambulatory Visit: Payer: Self-pay | Admitting: Family Medicine

## 2019-09-05 ENCOUNTER — Other Ambulatory Visit: Payer: Self-pay

## 2019-09-05 ENCOUNTER — Encounter: Payer: Self-pay | Admitting: Family Medicine

## 2019-09-05 VITALS — BP 100/60 | HR 58 | Ht 64.0 in | Wt 110.0 lb

## 2019-09-05 VITALS — BP 112/67 | HR 58 | Temp 96.9°F | Ht 64.0 in

## 2019-09-05 DIAGNOSIS — Z9181 History of falling: Secondary | ICD-10-CM | POA: Diagnosis not present

## 2019-09-05 DIAGNOSIS — G3184 Mild cognitive impairment, so stated: Secondary | ICD-10-CM

## 2019-09-05 DIAGNOSIS — I10 Essential (primary) hypertension: Secondary | ICD-10-CM

## 2019-09-05 DIAGNOSIS — R350 Frequency of micturition: Secondary | ICD-10-CM | POA: Diagnosis not present

## 2019-09-05 DIAGNOSIS — E782 Mixed hyperlipidemia: Secondary | ICD-10-CM

## 2019-09-05 DIAGNOSIS — I700 Atherosclerosis of aorta: Secondary | ICD-10-CM

## 2019-09-05 DIAGNOSIS — E440 Moderate protein-calorie malnutrition: Secondary | ICD-10-CM

## 2019-09-05 DIAGNOSIS — C3411 Malignant neoplasm of upper lobe, right bronchus or lung: Secondary | ICD-10-CM

## 2019-09-05 DIAGNOSIS — D709 Neutropenia, unspecified: Secondary | ICD-10-CM

## 2019-09-05 DIAGNOSIS — I25118 Atherosclerotic heart disease of native coronary artery with other forms of angina pectoris: Secondary | ICD-10-CM

## 2019-09-05 DIAGNOSIS — J432 Centrilobular emphysema: Secondary | ICD-10-CM | POA: Diagnosis not present

## 2019-09-05 DIAGNOSIS — I639 Cerebral infarction, unspecified: Secondary | ICD-10-CM

## 2019-09-05 DIAGNOSIS — R10819 Abdominal tenderness, unspecified site: Secondary | ICD-10-CM | POA: Diagnosis not present

## 2019-09-05 DIAGNOSIS — I2510 Atherosclerotic heart disease of native coronary artery without angina pectoris: Secondary | ICD-10-CM

## 2019-09-05 DIAGNOSIS — M4697 Unspecified inflammatory spondylopathy, lumbosacral region: Secondary | ICD-10-CM

## 2019-09-05 DIAGNOSIS — E1142 Type 2 diabetes mellitus with diabetic polyneuropathy: Secondary | ICD-10-CM

## 2019-09-05 DIAGNOSIS — G620 Drug-induced polyneuropathy: Secondary | ICD-10-CM

## 2019-09-05 DIAGNOSIS — R627 Adult failure to thrive: Secondary | ICD-10-CM

## 2019-09-05 LAB — POCT URINALYSIS DIPSTICK
Bilirubin, UA: NEGATIVE
Blood, UA: NEGATIVE
Glucose, UA: NEGATIVE
Nitrite, UA: NEGATIVE
Protein, UA: NEGATIVE
Spec Grav, UA: 1.025 (ref 1.010–1.025)
Urobilinogen, UA: 0.2 E.U./dL
pH, UA: 5 (ref 5.0–8.0)

## 2019-09-05 NOTE — Patient Instructions (Signed)
Medication Instructions:  No medication changes today.  *If you need a refill on your cardiac medications before your next appointment, please call your pharmacy*   Lab Work: No lab work today.   Testing/Procedures: Your EKG today shows sinus bradycardia which is a stable finding.    Follow-Up: At [facility], you and your health needs are our priority.  As part of our continuing mission to provide you with exceptional heart care, we have created designated Provider Care Teams.  These Care Teams include your primary Cardiologist (physician) and Advanced Practice Providers (APPs -  Physician Assistants and Nurse Practitioners) who all work together to provide you with the care you need, when you need it.  We recommend signing up for the patient portal called "MyChart".  Sign up information is provided on this After Visit Summary.  MyChart is used to connect with patients for Virtual Visits (Telemedicine).  Patients are able to view lab/test results, encounter notes, upcoming appointments, etc.  Non-urgent messages can be sent to your provider as well.   To learn more about what you can do with MyChart, go to [email or web address].    Your next appointment:   6 month(s)  The format for your next appointment:   In Person  Provider:   You may see Ida Rogue, MD or one of the following Advanced Practice Providers on your designated Care Team:    Murray Hodgkins, NP  Christell Faith, PA-C  Marrianne Mood, PA-C  Laurann Montana, NP  Other Instructions  Continue to monitor blood carefully at home. If blood pressure is consistently <110/60 please call Dr. Rosanna Randy or our office. If that is the case then we might consider decreasing some of your blood pressure medications. If you have a day with low blood pressure, make sure you eat and drink something.

## 2019-09-05 NOTE — Progress Notes (Signed)
Office Visit    Patient Name: [name] [name] Date of Encounter: 09/05/2019  Primary Care Provider:  Jerrol Banana., MD Primary Cardiologist:  Ida Rogue, MD Electrophysiologist:  None   Chief Complaint    [name] [name] is a 79 y.o. female with a hx of nonobstructive CAD by [facility] 2012, normal Myoview test 1, COPD, history of tobacco abuse, DM, HTN, HLD, CVA, s/p L carotid endarterectomy 10/2014, history of lung cancer s/p chemo and radiation, CKD III-IV, chest pain presents today for follow up of CAD  Past Medical History    Past Medical History:  Diagnosis Date  . Abnormal CT lung screening 10/17/2015  . COPD (chronic obstructive pulmonary disease) ([place])   . Coronary artery disease, non-occlusive    a. cath 2006: min nonobs CAD; b. cath 12/2010: cath LAD 50%, RCA 60%; c. 08/2013: Minimal luminal irregs, right dominant system with no significant CAD, diffuse luminal irregs noted. Normal EF 55%, no AS or MS.   . Diabetes mellitus   . Hyperlipemia    Followed by Dr. Rosanna Randy  . Hypertension   . Lung cancer ([place])   . Macular degeneration    rt  . Personal history of tobacco use, presenting hazards to health 10/15/2015  . Stroke [facility])    Past Surgical History:  Procedure Laterality Date  . ABDOMINAL HYSTERECTOMY    . ANTERIOR CERVICAL DECOMP/DISCECTOMY FUSION N/A 10/19/2013   Procedure: CERVICAL FIVE-SIX ANTERIOR CERVICAL DECOMPRESSION WITH FUSION INTERBODY PROSTHESIS PLATING AND PEEK CAGE;  Surgeon: Ophelia Charter, MD;  Location: [place] NEURO ORS;  Service: Neurosurgery;  Laterality: N/A;  . BACK SURGERY  80's  . BREAST CYST EXCISION Left    left negative   . CARDIAC CATHETERIZATION  05/2004  . CATARACT EXTRACTION Left   . CHOLECYSTECTOMY    . COLONOSCOPY N/A 12/29/2018   Procedure: COLONOSCOPY;  Surgeon: Toledo, Benay Pike, MD;  Location: [facility] ENDOSCOPY;  Service: Gastroenterology;  Laterality: N/A;  . ENDARTERECTOMY Left 10/24/2014   Procedure: ENDARTERECTOMY CAROTID;   Surgeon: Algernon Huxley, MD;  Location: [facility] ORS;  Service: Vascular;  Laterality: Left;  . ENDOBRONCHIAL ULTRASOUND N/A 11/14/2015   Procedure: ENDOBRONCHIAL ULTRASOUND;  Surgeon: [name], MD;  Location: [facility] ORS;  Service: Cardiopulmonary;  Laterality: N/A;  . PERIPHERAL VASCULAR CATHETERIZATION N/A 12/04/2015   Procedure: Glori Luis Cath Insertion;  Surgeon: Algernon Huxley, MD;  Location: [place] CV LAB;  Service: Cardiovascular;  Laterality: N/A;  . PORTA CATH REMOVAL N/A 05/17/2017   Procedure: PORTA CATH REMOVAL;  Surgeon: Algernon Huxley, MD;  Location: [place] CV LAB;  Service: Cardiovascular;  Laterality: N/A;  . TONSILLECTOMY AND ADENOIDECTOMY    . VESICOVAGINAL FISTULA CLOSURE W/ TAH      Allergies  Allergies  Allergen Reactions  . Coconut Fatty Acids Swelling and Other (See Comments)    Throat swells    History of Present Illness    [name] [name] is a 79 y.o. female with a hx of nonobstructive CAD by [facility] 2012, normal Myoview test 16, COPD, history of tobacco abuse, DM, HTN, HLD, CVA, s/p L carotid endarterectomy 10/2014, history of lung cancer s/p chemo and radiation, CKD III-IV, chest pain. She was last seen while hospitalized 06/2019.  Last heart cath 2012 with disease in the ostial RCA.  Not thought to be causing symptoms at that time and Myoview was recommended to evaluate reversible ischemia.  Nuclear stress test 2012 was normal.  Repeat heart cath 2015 with no  significant CAD (disease seen in 2012 is not appreciated in the LAD RCA) and normal Myoview 2016.  She quit smoking June 2017.  Completed chemo and radiation for lung cancer.  Seen in clinic by [facility] 11/02/2016 doing well at that time.  Due to history of CVA she is on aspirin and Plavix.  She is not on ACEI per pulmonary due to cough.  She had a stroke 05/06/2018 was placed on aspirin Plavix.  Seen in the ED 09/23/18 for chest pain seen by Dr. Humphrey Rolls was ruled out.  Echo February 2020 with normal LVEF.  Did not follow-up  with cardiology at that time.  Hospitalized 12/2018 for rectal bleeding.  She was continued on DAPT at that time.  Received COVID-19 vaccine 05/29/2027.  Presented PCP 06/06/2019 felt to be in respiratory distress and declined ED evaluation.  She was started on prednisone and doxycycline.  She presents to the ED 06/06/2019 wheezing, shortness of breath, pleuritic right-sided chest wall pain.  Covid negative.  Cardiology was consulted. HS troponin 7 -> 11 -> 9 -> 9. Her Imdur was increased to 75m daily due to elevated BP. Chest pain non-cardiac, VQ scan low propability.   Carotid duplex 06/12/19 with VVS bilaterl <40% stenosis.   Hospitalized 06/2019 120/60-65  EKGs/Labs/Other Studies Reviewed:   The following studies were reviewed today:  Echo 05/07/18:  1. The left ventricle has normal systolic function of 697-02% The cavity  size is normal. There is no left ventricular wall thickness. Echo evidence  of normal diastolic filling patterns.Unable to exclude region of  hypokinesis of the inferior wall.   2. The right ventricle is in size. There is normal systolic function.  Right ventricular systolic pressure is mildly elevated with an estimated  pressure of 34 mmHg.   3. Normal left atrial size.   4. The mitral valve normal in structure. Regurgitation is mild by color  flow Doppler.   5. Select images suggesting small PFO/ASD by color doppler. Bubble study  not performed.   6. Challenging images      EKG:  EKG is  ordered today.  The ekg ordered today demonstrates SB 58 bpm with short PR 108 ms and low voltage QRS  Recent Labs: 06/06/2019: ALT 19; B Natriuretic Peptide 127.7 06/14/2019: BUN 44; Creatinine, Ser 1.79; Hemoglobin 10.4; Platelets 283; Potassium 4.4; Sodium 140  Recent Lipid Panel    Component Value Date/Time   CHOL 149 06/07/2019 0141   CHOL 210 (H) 05/24/2017 1411   TRIG 41 06/07/2019 0141   HDL 57 06/07/2019 0141   HDL 52 05/24/2017 1411   CHOLHDL 2.6 06/07/2019 0141    VLDL 8 06/07/2019 0141   LDLCALC 84 06/07/2019 0141   LDLCALC 88 05/24/2017 1411    Home Medications   Current Meds  Medication Sig  . albuterol (VENTOLIN HFA) 108 (90 Base) MCG/ACT inhaler Inhale 2 puffs into the lungs every 4 (four) hours as needed for wheezing or shortness of breath.  .Marland KitchenamLODipine (NORVASC) 5 MG tablet TAKE 1 TABLET BY MOUTH EVERY DAY  . aspirin EC 81 MG EC tablet Take 1 tablet (81 mg total) by mouth daily.  .Marland Kitchenatorvastatin (LIPITOR) 40 MG tablet TAKE 1 TABLET BY MOUTH  DAILY AT 6 PM.  . Blood Glucose Monitoring Suppl (ACCU-CHEK GUIDE) w/Device KIT USE AS DIRECTED  . clopidogrel (PLAVIX) 75 MG tablet TAKE 1 TABLET BY MOUTH  DAILY  . donepezil (ARICEPT) 5 MG tablet TAKE 1 TABLET BY MOUTH EVERYDAY AT BEDTIME  .  fluticasone (FLONASE) 50 MCG/ACT nasal spray Place 2 sprays into both nostrils daily. (Patient taking differently: Place 2 sprays into both nostrils daily as needed for allergies. )  . gabapentin (NEURONTIN) 600 MG tablet TAKE ONE-HALF TABLET BY  MOUTH TWO TIMES DAILY (Patient taking differently: Take 300 mg by mouth 2 (two) times daily. )  . glimepiride (AMARYL) 2 MG tablet TAKE 1 TABLET BY MOUTH TWO  TIMES DAILY  . glucose blood (ACCU-CHEK GUIDE) test strip Check Blood Sugars Once Daily. Dx Code: E11.42  . ipratropium-albuterol (DUONEB) 0.5-2.5 (3) MG/3ML SOLN Take 3 mLs by nebulization every 4 (four) hours as needed.  Marland Kitchen LORazepam (ATIVAN) 1 MG tablet Take 1 tablet (1 mg total) by mouth 2 (two) times daily as needed for anxiety.  . magnesium oxide (MAG-OX) 400 (241.3 MG) MG tablet Take 400 mg by mouth 2 (two) times daily.   . metFORMIN (GLUCOPHAGE) 500 MG tablet Take 1 tablet (500 mg total) by mouth 2 (two) times daily with a meal. (Patient taking differently: Take 500 mg by mouth daily with breakfast. Can only tolerate once daily with meal)  . metoprolol succinate (TOPROL-XL) 100 MG 24 hr tablet Take 1 tablet (100 mg total) by mouth daily.  . mometasone (ELOCON)  0.1 % lotion Apply 4 drops topically at bedtime as needed (dry skin).  . Multiple Vitamin (MULTIVITAMIN) tablet Take 0.5 tablets by mouth 2 (two) times daily.  . Multiple Vitamins-Minerals (ICAPS) CAPS Take 1 capsule by mouth 2 (two) times daily.   . mupirocin cream (BACTROBAN) 2 % Apply 1 application topically 2 (two) times daily.  . nitroGLYCERIN (NITROSTAT) 0.4 MG SL tablet Place 1 tablet (0.4 mg total) under the tongue every 5 (five) minutes as needed for chest pain.  . pantoprazole (PROTONIX) 40 MG tablet TAKE 1 TABLET BY MOUTH  DAILY  . sucralfate (CARAFATE) 1 g tablet Take 1 tablet (1 g total) by mouth 2 (two) times daily.  Marland Kitchen VITAMIN D PO Take by mouth daily.   . vitamin E (VITAMIN E) 400 UNIT capsule Take 400 Units by mouth daily.    Review of Systems      Review of Systems  Constitution: Negative for chills, fever and malaise/fatigue.  Cardiovascular: Negative for chest pain, dyspnea on exertion, leg swelling, near-syncope, orthopnea, palpitations and syncope.  Respiratory: Negative for cough, shortness of breath and wheezing.   Gastrointestinal: Negative for nausea and vomiting.  Neurological: Negative for dizziness, light-headedness and weakness.   All other systems reviewed and are otherwise negative except as noted above.  Physical Exam    VS:  BP 100/60 (BP Location: Left Arm, Patient Position: Sitting, Cuff Size: Normal)   Pulse (!) 58   Ht _0  (1.626 m)   Wt 110 lb (49.9 kg) Comment: Declined to get on scale-weight estimated by patient  BMI 18.88 kg/m  , BMI Body mass index is 18.88 kg/m. GEN: Well nourished, well developed, in no acute distress. HEENT: normal. Neck: Supple, no JVD, carotid bruits, or masses. Cardiac: RRR, no murmurs, rubs, or gallops. No clubbing, cyanosis, edema.  Radials/DP/PT 2+ and equal bilaterally.  Respiratory:  Respirations regular and unlabored, clear to auscultation bilaterally. GI: Soft, nontender, nondistended, BS + x 4. MS: No  deformity or atrophy. Skin: Warm and dry, no rash. Neuro:  Strength and sensation are intact. Psych: Normal affect.  Assessment & Plan    1. Pleuritic chest pain right wall -noted during hospitalization 06/2019 in setting of COPD exacerbation.  No recurrent chest  pain, pressure, tightness.  No indication for ischemic evaluation. 2. CAD -stable with no anginal symptoms.  Continue GDMT including aspirin, Plavix, Imdur 60 mg, statin, metoprolol.  No indication for ischemic evaluation. 3. Hx CVA 04/2018 -continue aspirin, Plavix, statin.  Defer to neurology regarding length of DAPT. 4. HTN -blood pressure low normal today but reports no lightheadedness, dizziness.  Per her husband's report her home BP is routinely 120s over 60s.  Continue present antihypertensive regimen.  Educated to report blood pressure consistently less than 110/60.  If her blood pressure is routinely low could consider reduction of dose of Imdur versus amlodipine. 5. HLD -continue atorvastatin 40 mg daily.  Disposition: Follow up in 6 month(s) with Dr. Rockey Situ or APP.    Loel Dubonnet, NP 09/05/2019, 8:55 PM

## 2019-09-06 LAB — CBC WITH DIFFERENTIAL/PLATELET
Basophils Absolute: 0 10*3/uL (ref 0.0–0.2)
Basos: 0 %
EOS (ABSOLUTE): 0.1 10*3/uL (ref 0.0–0.4)
Eos: 2 %
Hematocrit: 31.9 % — ABNORMAL LOW (ref 34.0–46.6)
Hemoglobin: 10.6 g/dL — ABNORMAL LOW (ref 11.1–15.9)
Immature Grans (Abs): 0 10*3/uL (ref 0.0–0.1)
Immature Granulocytes: 0 %
Lymphocytes Absolute: 0.8 10*3/uL (ref 0.7–3.1)
Lymphs: 10 %
MCH: 30.5 pg (ref 26.6–33.0)
MCHC: 33.2 g/dL (ref 31.5–35.7)
MCV: 92 fL (ref 79–97)
Monocytes Absolute: 0.5 10*3/uL (ref 0.1–0.9)
Monocytes: 6 %
Neutrophils Absolute: 6.3 10*3/uL (ref 1.4–7.0)
Neutrophils: 82 %
Platelets: 237 10*3/uL (ref 150–450)
RBC: 3.48 x10E6/uL — ABNORMAL LOW (ref 3.77–5.28)
RDW: 14.8 % (ref 11.7–15.4)
WBC: 7.8 10*3/uL (ref 3.4–10.8)

## 2019-09-06 LAB — COMPREHENSIVE METABOLIC PANEL
ALT: 13 IU/L (ref 0–32)
AST: 14 IU/L (ref 0–40)
Albumin/Globulin Ratio: 1.8 (ref 1.2–2.2)
Albumin: 4.1 g/dL (ref 3.7–4.7)
Alkaline Phosphatase: 80 IU/L (ref 48–121)
BUN/Creatinine Ratio: 14 (ref 12–28)
BUN: 22 mg/dL (ref 8–27)
Bilirubin Total: 0.3 mg/dL (ref 0.0–1.2)
CO2: 22 mmol/L (ref 20–29)
Calcium: 8.8 mg/dL (ref 8.7–10.3)
Chloride: 97 mmol/L (ref 96–106)
Creatinine, Ser: 1.57 mg/dL — ABNORMAL HIGH (ref 0.57–1.00)
GFR calc Af Amer: 36 mL/min/{1.73_m2} — ABNORMAL LOW (ref >59)
GFR calc non Af Amer: 32 mL/min/{1.73_m2} — ABNORMAL LOW (ref >59)
Globulin, Total: 2.3 g/dL (ref 1.5–4.5)
Glucose: 348 mg/dL — ABNORMAL HIGH (ref 65–99)
Potassium: 4.8 mmol/L (ref 3.5–5.2)
Sodium: 140 mmol/L (ref 134–144)
Total Protein: 6.4 g/dL (ref 6.0–8.5)

## 2019-09-09 DIAGNOSIS — M4697 Unspecified inflammatory spondylopathy, lumbosacral region: Secondary | ICD-10-CM | POA: Insufficient documentation

## 2019-09-09 DIAGNOSIS — G620 Drug-induced polyneuropathy: Secondary | ICD-10-CM | POA: Insufficient documentation

## 2019-09-09 DIAGNOSIS — D709 Neutropenia, unspecified: Secondary | ICD-10-CM | POA: Insufficient documentation

## 2019-09-12 ENCOUNTER — Telehealth: Payer: Self-pay

## 2019-09-12 ENCOUNTER — Other Ambulatory Visit: Payer: Self-pay

## 2019-09-12 ENCOUNTER — Encounter: Payer: Self-pay | Admitting: Family Medicine

## 2019-09-12 ENCOUNTER — Ambulatory Visit (INDEPENDENT_AMBULATORY_CARE_PROVIDER_SITE_OTHER): Payer: Medicare Other | Admitting: Family Medicine

## 2019-09-12 VITALS — BP 119/74 | HR 63 | Resp 18

## 2019-09-12 DIAGNOSIS — M79604 Pain in right leg: Secondary | ICD-10-CM | POA: Diagnosis not present

## 2019-09-12 DIAGNOSIS — I2510 Atherosclerotic heart disease of native coronary artery without angina pectoris: Secondary | ICD-10-CM

## 2019-09-12 DIAGNOSIS — M79605 Pain in left leg: Secondary | ICD-10-CM

## 2019-09-12 DIAGNOSIS — J432 Centrilobular emphysema: Secondary | ICD-10-CM

## 2019-09-12 DIAGNOSIS — R627 Adult failure to thrive: Secondary | ICD-10-CM

## 2019-09-12 DIAGNOSIS — G3184 Mild cognitive impairment, so stated: Secondary | ICD-10-CM

## 2019-09-12 DIAGNOSIS — E440 Moderate protein-calorie malnutrition: Secondary | ICD-10-CM

## 2019-09-12 DIAGNOSIS — L89152 Pressure ulcer of sacral region, stage 2: Secondary | ICD-10-CM

## 2019-09-12 DIAGNOSIS — R29898 Other symptoms and signs involving the musculoskeletal system: Secondary | ICD-10-CM

## 2019-09-12 DIAGNOSIS — R350 Frequency of micturition: Secondary | ICD-10-CM | POA: Diagnosis not present

## 2019-09-12 DIAGNOSIS — C3411 Malignant neoplasm of upper lobe, right bronchus or lung: Secondary | ICD-10-CM

## 2019-09-12 LAB — POCT URINALYSIS DIPSTICK
Appearance: ABNORMAL
Bilirubin, UA: NEGATIVE
Blood, UA: NEGATIVE
Glucose, UA: NEGATIVE
Leukocytes, UA: NEGATIVE
Nitrite, UA: NEGATIVE
Odor: NORMAL
Protein, UA: POSITIVE — AB
Spec Grav, UA: 1.02 (ref 1.010–1.025)
Urobilinogen, UA: 0.2 E.U./dL
pH, UA: 6 (ref 5.0–8.0)

## 2019-09-12 NOTE — Telephone Encounter (Signed)
Patient has an office visit today, will give her lab results at appointment.

## 2019-09-12 NOTE — Progress Notes (Signed)
Established patient visit  I,[name],acting as a scribe for [name], MD.,have documented all relevant documentation on the behalf of [name], MD,as directed by  [name], MD while in the presence of [name], MD.   Patient: [name] [name]   DOB: 1940/11/13   79 y.o. Female  MRN: [id number] Visit Date: 09/12/2019  Today's healthcare provider: [name], MD   Chief Complaint  Patient presents with  . Back Pain   Subjective    Back Pain This is a new problem. The current episode started more than 1 month ago (3 months). The problem occurs constantly. The pain is present in the lumbar spine. The pain does not radiate. The pain is at a severity of 9/10. The pain is severe. The pain is the same all the time. The symptoms are aggravated by sitting. Stiffness is present all day. Pertinent negatives include no abdominal pain, bladder incontinence, bowel incontinence, chest pain, dysuria, fever, headaches, leg pain, numbness, paresis, paresthesias, pelvic pain, perianal numbness, tingling, weakness or weight loss.   Patient states she had a fall around 3 months ago. Patient states she hit her bottom at the time and had some pain in that area. Patient states pain went away, but began hurting again about 1 week ago. Patient states pain is severe. Patient is in [place] area. Patient states that it feels like there is knots or lumps where there is located. Patient also has a sore on her bottom. She is becoming less and less active and only transfers with help now.     Medications: Outpatient Medications Prior to Visit  Medication Sig  . albuterol (VENTOLIN HFA) 108 (90 Base) MCG/ACT inhaler Inhale 2 puffs into the lungs every 4 (four) hours as needed for wheezing or shortness of breath.  Marland Kitchen amLODipine (NORVASC) 5 MG tablet TAKE 1 TABLET BY MOUTH EVERY DAY  . aspirin EC 81 MG EC tablet Take 1 tablet (81 mg total) by mouth daily.  Marland Kitchen  atorvastatin (LIPITOR) 40 MG tablet TAKE 1 TABLET BY MOUTH  DAILY AT 6 PM.  . Blood Glucose Monitoring Suppl (ACCU-CHEK GUIDE) w/Device KIT USE AS DIRECTED  . clopidogrel (PLAVIX) 75 MG tablet TAKE 1 TABLET BY MOUTH  DAILY  . donepezil (ARICEPT) 5 MG tablet TAKE 1 TABLET BY MOUTH EVERYDAY AT BEDTIME  . fluticasone (FLONASE) 50 MCG/ACT nasal spray Place 2 sprays into both nostrils daily. (Patient taking differently: Place 2 sprays into both nostrils daily as needed for allergies. )  . gabapentin (NEURONTIN) 600 MG tablet TAKE ONE-HALF TABLET BY  MOUTH TWO TIMES DAILY (Patient taking differently: Take 300 mg by mouth 2 (two) times daily. )  . glimepiride (AMARYL) 2 MG tablet TAKE 1 TABLET BY MOUTH TWO  TIMES DAILY  . glucose blood (ACCU-CHEK GUIDE) test strip Check Blood Sugars Once Daily. Dx Code: E11.42  . ipratropium-albuterol (DUONEB) 0.5-2.5 (3) MG/3ML SOLN Take 3 mLs by nebulization every 4 (four) hours as needed.  Marland Kitchen LORazepam (ATIVAN) 1 MG tablet Take 1 tablet (1 mg total) by mouth 2 (two) times daily as needed for anxiety.  . magnesium oxide (MAG-OX) 400 (241.3 MG) MG tablet Take 400 mg by mouth 2 (two) times daily.   . metFORMIN (GLUCOPHAGE) 500 MG tablet Take 1 tablet (500 mg total) by mouth 2 (two) times daily with a meal. (Patient taking differently: Take 500 mg by mouth daily with breakfast. Can only tolerate once daily with meal)  .  metoprolol succinate (TOPROL-XL) 100 MG 24 hr tablet Take 1 tablet (100 mg total) by mouth daily.  . mometasone (ELOCON) 0.1 % lotion Apply 4 drops topically at bedtime as needed (dry skin).  . Multiple Vitamin (MULTIVITAMIN) tablet Take 0.5 tablets by mouth 2 (two) times daily.  . Multiple Vitamins-Minerals (ICAPS) CAPS Take 1 capsule by mouth 2 (two) times daily.   . mupirocin cream (BACTROBAN) 2 % Apply 1 application topically 2 (two) times daily.  . nitroGLYCERIN (NITROSTAT) 0.4 MG SL tablet Place 1 tablet (0.4 mg total) under the tongue every 5 (five)  minutes as needed for chest pain.  . pantoprazole (PROTONIX) 40 MG tablet TAKE 1 TABLET BY MOUTH  DAILY  . sucralfate (CARAFATE) 1 g tablet Take 1 tablet (1 g total) by mouth 2 (two) times daily.  Marland Kitchen VITAMIN D PO Take by mouth daily.   . vitamin E (VITAMIN E) 400 UNIT capsule Take 400 Units by mouth daily.  . isosorbide mononitrate (IMDUR) 60 MG 24 hr tablet Take 1 tablet (60 mg total) by mouth daily.   Facility-Administered Medications Prior to Visit  Medication Dose Route Frequency Provider  . ondansetron (ZOFRAN) 8 mg, dexamethasone (DECADRON) 10 mg in sodium chloride 0.9 % 50 mL IVPB   Intravenous Once Lloyd Huger, MD    Review of Systems  Constitutional: Negative for appetite change, chills, fatigue, fever and weight loss.  Respiratory: Negative for chest tightness and shortness of breath.   Cardiovascular: Negative for chest pain and palpitations.  Gastrointestinal: Negative for abdominal pain, bowel incontinence, nausea and vomiting.  Genitourinary: Negative for bladder incontinence, dysuria and pelvic pain.  Musculoskeletal: Positive for back pain.  Neurological: Negative for dizziness, tingling, weakness, numbness, headaches and paresthesias.       Objective    BP 119/74 (BP Location: Left Arm, Patient Position: Sitting, Cuff Size: Large)   Pulse 63   Resp 18   SpO2 95%  Wt Readings from Last 3 Encounters:  09/05/19 110 lb (49.9 kg)  06/12/19 135 lb (61.2 kg)  06/06/19 136 lb (61.7 kg)      Physical Exam Vitals and nursing note reviewed.  Constitutional:      Appearance: Normal appearance. She is normal weight.  Cardiovascular:     Rate and Rhythm: Normal rate and regular rhythm.     Pulses: Normal pulses.     Heart sounds: Normal heart sounds.  Pulmonary:     Effort: Pulmonary effort is normal.     Breath sounds: Normal breath sounds.  Abdominal:     General: Bowel sounds are normal.     Palpations: Abdomen is soft.  Musculoskeletal:        General:  Normal range of motion.     Cervical back: Normal range of motion and neck supple.  Skin:    General: Skin is warm and dry.  Neurological:     Mental Status: She is alert.  Psychiatric:        Mood and Affect: Mood normal.        Behavior: Behavior normal.       Results for orders placed or performed in visit on 09/12/19  POCT urinalysis dipstick  Result Value Ref Range   Color, UA Yellow    Clarity, UA Cloudy    Glucose, UA Negative Negative   Bilirubin, UA Negative    Ketones, UA Small    Spec Grav, UA 1.020 1.010 - 1.025   Blood, UA Negative    pH, UA  6.0 5.0 - 8.0   Protein, UA Positive (A) Negative   Urobilinogen, UA 0.2 0.2 or 1.0 E.U./dL   Nitrite, UA Negative    Leukocytes, UA Negative Negative   Appearance Abnormal    Odor Normal     Assessment & Plan     1. Urinary frequency Urine does not look to be infected. - POCT urinalysis dipstick  2. Failure to thrive in adult I do not think the weight today is accurate.  I did not review this until the patient had left.  Make sure we reweigh on her next visit.  I do think she is in failing health and palliative care is indicated.  Her husband is unable to care for her at home completely because of her inability to contribute at all. - Ambulatory referral to Home Health - Amb Referral to Palliative Care  3. Pain in both lower extremities  - Ambulatory referral to Home Health - Amb Referral to Palliative Care  4. Weakness of both legs  - Ambulatory referral to Home Health - Amb Referral to Palliative Care  5. CAD in native artery All risk factors treated.  Patient has quit smoking. - Ambulatory referral to [place] - Amb Referral to Palliative Care  6. MCI (mild cognitive impairment)  - Ambulatory referral to Home Health - Amb Referral to Palliative Care  7. Centrilobular emphysema ([place]) Patient is quit smoking. - Pulse oximetry, overnight - Amb Referral to Palliative Care 8.  Stage I sacral  decubitus Try eggcrate mattress.  I think her nutrition status is contributing to this also. 9.  Lung cancer Patient to be followed by Dr. [name] in September 10.  Malnutrition No follow-ups on file.      I, [name], MD, have reviewed all documentation for this visit. The documentation on 09/14/19 for the exam, diagnosis, procedures, and orders are all accurate and complete.    Domani Bakos Cranford Mon, MD  [facility] [phone] (phone) [phone] (fax)  [place]

## 2019-09-12 NOTE — Patient Instructions (Signed)
Try an egg crate mattress for pressure sore on bottom.

## 2019-09-12 NOTE — Telephone Encounter (Signed)
-----   Message from Jerrol Banana., MD sent at 09/12/2019  8:14 AM EDT ----- Labs stable but sugar little high.  Check fasting blood sugars to make sure they are stable.

## 2019-09-15 ENCOUNTER — Telehealth: Payer: Self-pay | Admitting: Adult Health Nurse Practitioner

## 2019-09-15 NOTE — Telephone Encounter (Signed)
Scheduled [facility] Palliative visit for 10-05-19 at 12:30.

## 2019-09-24 DIAGNOSIS — J449 Chronic obstructive pulmonary disease, unspecified: Secondary | ICD-10-CM | POA: Diagnosis not present

## 2019-09-24 DIAGNOSIS — R0602 Shortness of breath: Secondary | ICD-10-CM | POA: Diagnosis not present

## 2019-09-25 NOTE — Progress Notes (Deleted)
Established patient visit   Patient: [name] [name]   DOB: January 14, 1941   79 y.o. Female  MRN: [id number] Visit Date: 09/26/2019  Today's healthcare provider: [name], MD   No chief complaint on file.  Subjective    HPI ***  {Show patient history (optional):23778::" "}   Medications: Outpatient Medications Prior to Visit  Medication Sig  . albuterol (VENTOLIN HFA) 108 (90 Base) MCG/ACT inhaler Inhale 2 puffs into the lungs every 4 (four) hours as needed for wheezing or shortness of breath.  Marland Kitchen amLODipine (NORVASC) 5 MG tablet TAKE 1 TABLET BY MOUTH EVERY DAY  . aspirin EC 81 MG EC tablet Take 1 tablet (81 mg total) by mouth daily.  Marland Kitchen atorvastatin (LIPITOR) 40 MG tablet TAKE 1 TABLET BY MOUTH  DAILY AT 6 PM.  . Blood Glucose Monitoring Suppl (ACCU-CHEK GUIDE) w/Device KIT USE AS DIRECTED  . clopidogrel (PLAVIX) 75 MG tablet TAKE 1 TABLET BY MOUTH  DAILY  . donepezil (ARICEPT) 5 MG tablet TAKE 1 TABLET BY MOUTH EVERYDAY AT BEDTIME  . fluticasone (FLONASE) 50 MCG/ACT nasal spray Place 2 sprays into both nostrils daily. (Patient taking differently: Place 2 sprays into both nostrils daily as needed for allergies. )  . gabapentin (NEURONTIN) 600 MG tablet TAKE ONE-HALF TABLET BY  MOUTH TWO TIMES DAILY (Patient taking differently: Take 300 mg by mouth 2 (two) times daily. )  . glimepiride (AMARYL) 2 MG tablet TAKE 1 TABLET BY MOUTH TWO  TIMES DAILY  . glucose blood (ACCU-CHEK GUIDE) test strip Check Blood Sugars Once Daily. Dx Code: E11.42  . ipratropium-albuterol (DUONEB) 0.5-2.5 (3) MG/3ML SOLN Take 3 mLs by nebulization every 4 (four) hours as needed.  . isosorbide mononitrate (IMDUR) 60 MG 24 hr tablet Take 1 tablet (60 mg total) by mouth daily.  Marland Kitchen LORazepam (ATIVAN) 1 MG tablet Take 1 tablet (1 mg total) by mouth 2 (two) times daily as needed for anxiety.  . magnesium oxide (MAG-OX) 400 (241.3 MG) MG tablet Take 400 mg by mouth 2 (two) times daily.   . metFORMIN  (GLUCOPHAGE) 500 MG tablet Take 1 tablet (500 mg total) by mouth 2 (two) times daily with a meal. (Patient taking differently: Take 500 mg by mouth daily with breakfast. Can only tolerate once daily with meal)  . metoprolol succinate (TOPROL-XL) 100 MG 24 hr tablet Take 1 tablet (100 mg total) by mouth daily.  . mometasone (ELOCON) 0.1 % lotion Apply 4 drops topically at bedtime as needed (dry skin).  . Multiple Vitamin (MULTIVITAMIN) tablet Take 0.5 tablets by mouth 2 (two) times daily.  . Multiple Vitamins-Minerals (ICAPS) CAPS Take 1 capsule by mouth 2 (two) times daily.   . mupirocin cream (BACTROBAN) 2 % Apply 1 application topically 2 (two) times daily.  . nitroGLYCERIN (NITROSTAT) 0.4 MG SL tablet Place 1 tablet (0.4 mg total) under the tongue every 5 (five) minutes as needed for chest pain.  . pantoprazole (PROTONIX) 40 MG tablet TAKE 1 TABLET BY MOUTH  DAILY  . sucralfate (CARAFATE) 1 g tablet Take 1 tablet (1 g total) by mouth 2 (two) times daily.  Marland Kitchen VITAMIN D PO Take by mouth daily.   . vitamin E (VITAMIN E) 400 UNIT capsule Take 400 Units by mouth daily.   Facility-Administered Medications Prior to Visit  Medication Dose Route Frequency Provider  . ondansetron (ZOFRAN) 8 mg, dexamethasone (DECADRON) 10 mg in sodium chloride 0.9 % 50 mL IVPB   Intravenous Once Delight Hoh  J, MD    Review of Systems  Constitutional: Negative for appetite change, chills, fatigue and fever.  Respiratory: Negative for chest tightness and shortness of breath.   Cardiovascular: Negative for chest pain and palpitations.  Gastrointestinal: Negative for abdominal pain, nausea and vomiting.  Neurological: Negative for dizziness and weakness.    {Heme  Chem  Endocrine  Serology  Results Review (optional):23779::" "}  Objective    There were no vitals taken for this visit. {Show previous vital signs (optional):23777::" "}  Physical Exam  ***  No results found for any visits on 09/26/19.   Assessment & Plan     ***  No follow-ups on file.      {provider attestation***:1}   [name], MD  [facility] [address] [phone] (phone) [phone] (fax)  [place]

## 2019-09-26 ENCOUNTER — Ambulatory Visit: Payer: Self-pay | Admitting: Family Medicine

## 2019-09-26 DIAGNOSIS — J449 Chronic obstructive pulmonary disease, unspecified: Secondary | ICD-10-CM | POA: Diagnosis not present

## 2019-10-05 ENCOUNTER — Other Ambulatory Visit: Payer: Medicare Other | Admitting: Adult Health Nurse Practitioner

## 2019-10-05 ENCOUNTER — Other Ambulatory Visit: Payer: Self-pay

## 2019-10-05 DIAGNOSIS — Z515 Encounter for palliative care: Secondary | ICD-10-CM

## 2019-10-05 DIAGNOSIS — J449 Chronic obstructive pulmonary disease, unspecified: Secondary | ICD-10-CM

## 2019-10-05 NOTE — Progress Notes (Signed)
[facility] Consult Note Telephone: [phone]  Fax: [phone]  PATIENT NAME: [name] [name] DOB: 01/26/1941 MRN: [id number]  PRIMARY CARE PROVIDER:   Jerrol Banana., MD  REFERRING PROVIDER:  Jerrol Banana., MD [address] [place],  [place] [address]  RESPONSIBLE PARTY:   Shynia Daleo, husband H: [phone] C: [phone]    RECOMMENDATIONS and PLAN:  1.  Advanced care planning.  In chart patient has DNR uploaded to epic.  Has been states he is unsure where DNR is in the home.  States that he at least wants CPR tried.  Briefly started going over MOST form.  Did not want to fill out today has been does state that he did not want aggressive measures such as life support and ventilation.  Have encouraged them to have further discussion about this and will explore further at a future visit.  2.  Functional status.  Patient uses walker to ambulate.  Not able to walk long distances.  Does use wheelchair for appointments outside of the home.  Is able to stand on her own but does require her husband to help with transfers.  Requires assistance with bathing but can dress herself.  Continue supportive care at home  3.  Nutritional status.  No reports of decreased appetite.  States that weight has been stable at 135 pounds.  Patient does have occasional constipation.  States having small bowel movement today.  Does have tenderness and firmness noted to the left abdomen believed to be constipation.  Have encouraged trying MiraLAX daily as needed.  4.  Pressure injury.  Patient has stage I wound to sacrum.  Husband states that this is being followed by home health nurse.  States that this had been stopped due to insurance and provider is working on extending this.  Have encouraged using a zinc oxide cream for this such as Desitin.  Husband states that she is also getting a rash underneath her breasts and discussed using  miconazole cream for this.  Patient seems stable at this time.  Palliative will continue to follow for symptom management/decline and will make recommendations as needed.  Husband prefers to a monthly phone call to check in to see if visit is needed.  Encouraged to call with any questions or concerns  I spent 110 minutes providing this consultation,  From 1:10 to 3:00 including time spent with patient/family, chart review, provider coordination, documentation. More than 50% of the time in this consultation was spent coordinating communication.   HISTORY OF PRESENT ILLNESS:  [name] [name] is a 79 y.o. year old female with multiple medical problems including mild cognitive impairment, COPD, CAD, DMT2, HTN, CKD stage III, history of lung cancer. Palliative Care was asked to help address goals of care.  Patient is O2 dependent at 2 L which she wears all night and most of the day.  CODE STATUS: see above  PPS: 40% HOSPICE ELIGIBILITY/DIAGNOSIS: TBD  PHYSICAL EXAM:  BP 129/55  HR 55 O2 93% on RA General: NAD, frail appearing Cardiovascular: regular rate and rhythm Pulmonary: lung sounds clear;normal respiratory effort Abdomen:tenderness and firmness noted to mid left abdomen,  + bowel sounds GU: no suprapubic tenderness Extremities: no edema, no joint deformities Skin: no rashes on exposed skin Neurological: Weakness; A&O x3 though can be forgetful and not good with timelines   PAST MEDICAL HISTORY:  Past Medical History:  Diagnosis Date  . Abnormal CT lung screening 10/17/2015  .  COPD (chronic obstructive pulmonary disease) ([place])   . Coronary artery disease, non-occlusive    a. cath 2006: min nonobs CAD; b. cath 12/2010: cath LAD 50%, RCA 60%; c. 08/2013: Minimal luminal irregs, right dominant system with no significant CAD, diffuse luminal irregs noted. Normal EF 55%, no AS or MS.   . Diabetes mellitus   . Hyperlipemia    Followed by Dr. Rosanna Randy  . Hypertension   . Lung cancer ([place])     . Macular degeneration    rt  . Personal history of tobacco use, presenting hazards to health 10/15/2015  . Stroke [facility])     SOCIAL HX:  Social History   Tobacco Use  . Smoking status: Former Smoker    Packs/day: 1.00    Years: 50.00    Pack years: 50.00    Types: Cigarettes    Quit date: 10/03/2015    Years since quitting: 4.0  . Smokeless tobacco: Never Used  . Tobacco comment: smokes 3 cigs daily 05/06/15. Pt instructed to quit.  Substance Use Topics  . Alcohol use: No    Alcohol/week: 0.0 standard drinks    ALLERGIES:  Allergies  Allergen Reactions  . Coconut Fatty Acids Swelling and Other (See Comments)    Throat swells     PERTINENT MEDICATIONS:  Outpatient Encounter Medications as of 10/05/2019  Medication Sig  . albuterol (VENTOLIN HFA) 108 (90 Base) MCG/ACT inhaler Inhale 2 puffs into the lungs every 4 (four) hours as needed for wheezing or shortness of breath.  Marland Kitchen amLODipine (NORVASC) 5 MG tablet TAKE 1 TABLET BY MOUTH EVERY DAY  . aspirin EC 81 MG EC tablet Take 1 tablet (81 mg total) by mouth daily.  Marland Kitchen atorvastatin (LIPITOR) 40 MG tablet TAKE 1 TABLET BY MOUTH  DAILY AT 6 PM.  . Blood Glucose Monitoring Suppl (ACCU-CHEK GUIDE) w/Device KIT USE AS DIRECTED  . clopidogrel (PLAVIX) 75 MG tablet TAKE 1 TABLET BY MOUTH  DAILY  . donepezil (ARICEPT) 5 MG tablet TAKE 1 TABLET BY MOUTH EVERYDAY AT BEDTIME  . fluticasone (FLONASE) 50 MCG/ACT nasal spray Place 2 sprays into both nostrils daily. (Patient taking differently: Place 2 sprays into both nostrils daily as needed for allergies. )  . gabapentin (NEURONTIN) 600 MG tablet TAKE ONE-HALF TABLET BY  MOUTH TWO TIMES DAILY (Patient taking differently: Take 300 mg by mouth 2 (two) times daily. )  . glimepiride (AMARYL) 2 MG tablet TAKE 1 TABLET BY MOUTH TWO  TIMES DAILY  . glucose blood (ACCU-CHEK GUIDE) test strip Check Blood Sugars Once Daily. Dx Code: E11.42  . ipratropium-albuterol (DUONEB) 0.5-2.5 (3) MG/3ML SOLN Take 3  mLs by nebulization every 4 (four) hours as needed.  . isosorbide mononitrate (IMDUR) 60 MG 24 hr tablet Take 1 tablet (60 mg total) by mouth daily.  Marland Kitchen LORazepam (ATIVAN) 1 MG tablet Take 1 tablet (1 mg total) by mouth 2 (two) times daily as needed for anxiety.  . magnesium oxide (MAG-OX) 400 (241.3 MG) MG tablet Take 400 mg by mouth 2 (two) times daily.   . metFORMIN (GLUCOPHAGE) 500 MG tablet Take 1 tablet (500 mg total) by mouth 2 (two) times daily with a meal. (Patient taking differently: Take 500 mg by mouth daily with breakfast. Can only tolerate once daily with meal)  . metoprolol succinate (TOPROL-XL) 100 MG 24 hr tablet Take 1 tablet (100 mg total) by mouth daily.  . mometasone (ELOCON) 0.1 % lotion Apply 4 drops topically at bedtime as needed (dry skin).  Marland Kitchen  Multiple Vitamin (MULTIVITAMIN) tablet Take 0.5 tablets by mouth 2 (two) times daily.  . Multiple Vitamins-Minerals (ICAPS) CAPS Take 1 capsule by mouth 2 (two) times daily.   . mupirocin cream (BACTROBAN) 2 % Apply 1 application topically 2 (two) times daily.  . nitroGLYCERIN (NITROSTAT) 0.4 MG SL tablet Place 1 tablet (0.4 mg total) under the tongue every 5 (five) minutes as needed for chest pain.  . pantoprazole (PROTONIX) 40 MG tablet TAKE 1 TABLET BY MOUTH  DAILY  . sucralfate (CARAFATE) 1 g tablet Take 1 tablet (1 g total) by mouth 2 (two) times daily.  Marland Kitchen VITAMIN D PO Take by mouth daily.   . vitamin E (VITAMIN E) 400 UNIT capsule Take 400 Units by mouth daily.   Facility-Administered Encounter Medications as of 10/05/2019  Medication  . ondansetron (ZOFRAN) 8 mg, dexamethasone (DECADRON) 10 mg in sodium chloride 0.9 % 50 mL IVPB     Meosha Castanon Jenetta Downer, NP

## 2019-10-06 ENCOUNTER — Other Ambulatory Visit: Payer: Self-pay | Admitting: Family Medicine

## 2019-10-06 DIAGNOSIS — E1142 Type 2 diabetes mellitus with diabetic polyneuropathy: Secondary | ICD-10-CM

## 2019-10-10 DIAGNOSIS — H353221 Exudative age-related macular degeneration, left eye, with active choroidal neovascularization: Secondary | ICD-10-CM | POA: Diagnosis not present

## 2019-10-13 NOTE — Progress Notes (Deleted)
   Established patient visit   Patient: [name] [name]   DOB: 11/25/1940   79 y.o. Female  MRN: [id number] Visit Date: 10/17/2019  Today's healthcare provider: Richard Gilbert Jr, MD   No chief complaint on file.  Subjective    HPI  ***  {Show patient history (optional):23778::" "}   Medications: Outpatient Medications Prior to Visit  Medication Sig  . albuterol (VENTOLIN HFA) 108 (90 Base) MCG/ACT inhaler Inhale 2 puffs into the lungs every 4 (four) hours as needed for wheezing or shortness of breath.  . amLODipine (NORVASC) 5 MG tablet TAKE 1 TABLET BY MOUTH EVERY DAY  . aspirin EC 81 MG EC tablet Take 1 tablet (81 mg total) by mouth daily.  . atorvastatin (LIPITOR) 40 MG tablet TAKE 1 TABLET BY MOUTH  DAILY AT 6 PM.  . Blood Glucose Monitoring Suppl (ACCU-CHEK GUIDE) w/Device KIT USE AS DIRECTED  . clopidogrel (PLAVIX) 75 MG tablet TAKE 1 TABLET BY MOUTH  DAILY  . donepezil (ARICEPT) 5 MG tablet TAKE 1 TABLET BY MOUTH EVERYDAY AT BEDTIME  . fluticasone (FLONASE) 50 MCG/ACT nasal spray Place 2 sprays into both nostrils daily. (Patient taking differently: Place 2 sprays into both nostrils daily as needed for allergies. )  . gabapentin (NEURONTIN) 600 MG tablet TAKE ONE-HALF TABLET BY  MOUTH TWO TIMES DAILY (Patient taking differently: Take 300 mg by mouth 2 (two) times daily. )  . glimepiride (AMARYL) 2 MG tablet TAKE 1 TABLET BY MOUTH TWO  TIMES DAILY  . glucose blood (ACCU-CHEK GUIDE) test strip USE ONCE DAILY AS DIRECTED  . ipratropium-albuterol (DUONEB) 0.5-2.5 (3) MG/3ML SOLN Take 3 mLs by nebulization every 4 (four) hours as needed.  . isosorbide mononitrate (IMDUR) 60 MG 24 hr tablet Take 1 tablet (60 mg total) by mouth daily.  . LORazepam (ATIVAN) 1 MG tablet Take 1 tablet (1 mg total) by mouth 2 (two) times daily as needed for anxiety.  . magnesium oxide (MAG-OX) 400 (241.3 MG) MG tablet Take 400 mg by mouth 2 (two) times daily.   . metFORMIN (GLUCOPHAGE) 500 MG tablet  Take 1 tablet (500 mg total) by mouth 2 (two) times daily with a meal. (Patient taking differently: Take 500 mg by mouth daily with breakfast. Can only tolerate once daily with meal)  . metoprolol succinate (TOPROL-XL) 100 MG 24 hr tablet Take 1 tablet (100 mg total) by mouth daily.  . mometasone (ELOCON) 0.1 % lotion Apply 4 drops topically at bedtime as needed (dry skin).  . Multiple Vitamin (MULTIVITAMIN) tablet Take 0.5 tablets by mouth 2 (two) times daily.  . Multiple Vitamins-Minerals (ICAPS) CAPS Take 1 capsule by mouth 2 (two) times daily.   . mupirocin cream (BACTROBAN) 2 % Apply 1 application topically 2 (two) times daily.  . nitroGLYCERIN (NITROSTAT) 0.4 MG SL tablet Place 1 tablet (0.4 mg total) under the tongue every 5 (five) minutes as needed for chest pain.  . pantoprazole (PROTONIX) 40 MG tablet TAKE 1 TABLET BY MOUTH  DAILY  . sucralfate (CARAFATE) 1 g tablet Take 1 tablet (1 g total) by mouth 2 (two) times daily.  . VITAMIN D PO Take by mouth daily.   . vitamin E (VITAMIN E) 400 UNIT capsule Take 400 Units by mouth daily.   Facility-Administered Medications Prior to Visit  Medication Dose Route Frequency Provider  . ondansetron (ZOFRAN) 8 mg, dexamethasone (DECADRON) 10 mg in sodium chloride 0.9 % 50 mL IVPB   Intravenous Once Finnegan, Timothy J, MD      Review of Systems  {Heme  Chem  Endocrine  Serology  Results Review (optional):23779::" "}  Objective    There were no vitals taken for this visit. {Show previous vital signs (optional):23777::" "}  Physical Exam  ***  No results found for any visits on 10/17/19.  Assessment & Plan     ***  No follow-ups on file.      {provider attestation***:1}   [name], MD  [facility] [phone] (phone) [phone] (fax)  [place]

## 2019-10-17 ENCOUNTER — Ambulatory Visit: Payer: Self-pay | Admitting: Family Medicine

## 2019-10-24 DIAGNOSIS — J449 Chronic obstructive pulmonary disease, unspecified: Secondary | ICD-10-CM | POA: Diagnosis not present

## 2019-10-24 DIAGNOSIS — R0602 Shortness of breath: Secondary | ICD-10-CM | POA: Diagnosis not present

## 2019-10-26 DIAGNOSIS — J449 Chronic obstructive pulmonary disease, unspecified: Secondary | ICD-10-CM | POA: Diagnosis not present

## 2019-10-30 ENCOUNTER — Telehealth: Payer: Self-pay | Admitting: Adult Health Nurse Practitioner

## 2019-10-30 NOTE — Telephone Encounter (Signed)
Spoke with husband and set up appointment for 11/07/19 @ 1:30 Seniyah Esker K. Olena Heckle NP

## 2019-11-01 ENCOUNTER — Other Ambulatory Visit: Payer: Self-pay | Admitting: Family Medicine

## 2019-11-01 DIAGNOSIS — K2100 Gastro-esophageal reflux disease with esophagitis, without bleeding: Secondary | ICD-10-CM

## 2019-11-07 ENCOUNTER — Other Ambulatory Visit: Payer: Self-pay

## 2019-11-07 ENCOUNTER — Other Ambulatory Visit: Payer: Medicare Other | Admitting: Adult Health Nurse Practitioner

## 2019-11-07 DIAGNOSIS — Z515 Encounter for palliative care: Secondary | ICD-10-CM | POA: Diagnosis not present

## 2019-11-07 DIAGNOSIS — J449 Chronic obstructive pulmonary disease, unspecified: Secondary | ICD-10-CM

## 2019-11-07 NOTE — Progress Notes (Signed)
[facility] Consult Note Telephone: [phone]  Fax: [phone]  PATIENT NAME: [name] [name] DOB: 1941-03-31 MRN: [id number]  PRIMARY CARE PROVIDER:   Jerrol Banana., MD  REFERRING PROVIDER:  Jerrol Banana., MD [address] [place],  [place] [address]  RESPONSIBLE PARTY:   Kinslee Dalpe, husband H: [phone] C: [phone]    RECOMMENDATIONS and PLAN:  1.  Advanced care planning.  In chart patient has DNR uploaded to epic.    Husband has stated at last visit that he would like CPR to be tried but does not want aggressive life support or ventilation measures.  Husband not present at visit today to go over further  2.  Functional status.  Stepdaughter present at visit today and states that she does not walk much.  She mostly sits in her chair throughout the day.  Requires assistance with transfers.  Requires assistance with bathing but can dress herself and feed herself.  Continue supportive care at home  3.  Yeast infection.  Stepdaughter does state that she gets yeast infection under her breasts.  States that 2 days ago it was bright red and irritated.  Today the skin under her breasts looks clear with no redness, rash, warmth to touch noted.  They have been using Monistat cream for the yeast infection which she has resolved the infection.  Have encouraged using preventative measures such as gauze or a lightweight cloth under her breast to absorb moisture to prevent the yeast infection or using powders such as [place].  Patient seems stable at this time.  Palliative will continue to follow for symptom management/decline and will make recommendations as needed.  Husband prefers to a monthly phone call to check in to see if visit is needed.  Encouraged to call with any questions or concerns  I spent 40 minutes providing this consultation,  from 1:30 to 2:10 including time spent with patient/family, chart review,  provider coordination, documentation. More than 50% of the time in this consultation was spent coordinating communication.   HISTORY OF PRESENT ILLNESS:  [name] [name] is a 79 y.o. year old female with multiple medical problems including mild cognitive impairment, COPD, CAD, DMT2, HTN, CKD stage III, history of lung cancer. Palliative Care was asked to help address goals of care. Patient is O2 dependent at 2 L which she wears all night and most of the day.  Denies pain, fever, headaches, dizziness, increased shortness of breath or cough, hematuria, dysuria, N/V/D, constipation.  Though does state has a cough occasional constipation relieved with milk of magnesia as needed.  Does get occasional indigestion which is relieved with Gaviscon as needed.  CODE STATUS: See above  PPS: 40% HOSPICE ELIGIBILITY/DIAGNOSIS: TBD  PHYSICAL EXAM:  BP 122/58  HR 56 O2 98% on RA General: NAD, frail appearing Cardiovascular: regular rate and rhythm Pulmonary: lung sounds clear;normal respiratory effort Abdomen:tenderness and firmness noted to mid left abdomen,  + bowel sounds GU: no suprapubic tenderness Extremities: no edema, no joint deformities Skin: Has very dry skin noted to face with redness around eyelids where she has been rubbing her eyes Neurological: Weakness; A&O x3 though can be forgetful and not good with timelines  PAST MEDICAL HISTORY:  Past Medical History:  Diagnosis Date   Abnormal CT lung screening 10/17/2015   COPD (chronic obstructive pulmonary disease) (HCC)    Coronary artery disease, non-occlusive    a. cath 2006: min nonobs CAD; b. cath  12/2010: cath LAD 50%, RCA 60%; c. 08/2013: Minimal luminal irregs, right dominant system with no significant CAD, diffuse luminal irregs noted. Normal EF 55%, no AS or MS.    Diabetes mellitus    Hyperlipemia    Followed by Dr. Rosanna Randy   Hypertension    Lung cancer [facility])    Macular degeneration    rt   Personal history of tobacco use,  presenting hazards to health 10/15/2015   Stroke [facility])     SOCIAL HX:  Social History   Tobacco Use   Smoking status: Former Smoker    Packs/day: 1.00    Years: 50.00    Pack years: 50.00    Types: Cigarettes    Quit date: 10/03/2015    Years since quitting: 4.0   Smokeless tobacco: Never Used   Tobacco comment: smokes 3 cigs daily 05/06/15. Pt instructed to quit.  Substance Use Topics   Alcohol use: No    Alcohol/week: 0.0 standard drinks    ALLERGIES:  Allergies  Allergen Reactions   Coconut Fatty Acids Swelling and Other (See Comments)    Throat swells     PERTINENT MEDICATIONS:  Outpatient Encounter Medications as of 11/07/2019  Medication Sig   albuterol (VENTOLIN HFA) 108 (90 Base) MCG/ACT inhaler Inhale 2 puffs into the lungs every 4 (four) hours as needed for wheezing or shortness of breath.   amLODipine (NORVASC) 5 MG tablet TAKE 1 TABLET BY MOUTH EVERY DAY   aspirin EC 81 MG EC tablet Take 1 tablet (81 mg total) by mouth daily.   atorvastatin (LIPITOR) 40 MG tablet TAKE 1 TABLET BY MOUTH  DAILY AT 6 PM.   Blood Glucose Monitoring Suppl (ACCU-CHEK GUIDE) w/Device KIT USE AS DIRECTED   clopidogrel (PLAVIX) 75 MG tablet TAKE 1 TABLET BY MOUTH  DAILY   donepezil (ARICEPT) 5 MG tablet TAKE 1 TABLET BY MOUTH EVERYDAY AT BEDTIME   fluticasone (FLONASE) 50 MCG/ACT nasal spray Place 2 sprays into both nostrils daily. (Patient taking differently: Place 2 sprays into both nostrils daily as needed for allergies. )   gabapentin (NEURONTIN) 600 MG tablet TAKE ONE-HALF TABLET BY  MOUTH TWO TIMES DAILY (Patient taking differently: Take 300 mg by mouth 2 (two) times daily. )   glimepiride (AMARYL) 2 MG tablet TAKE 1 TABLET BY MOUTH TWO  TIMES DAILY   glucose blood (ACCU-CHEK GUIDE) test strip USE ONCE DAILY AS DIRECTED   ipratropium-albuterol (DUONEB) 0.5-2.5 (3) MG/3ML SOLN Take 3 mLs by nebulization every 4 (four) hours as needed.   isosorbide mononitrate (IMDUR) 60  MG 24 hr tablet Take 1 tablet (60 mg total) by mouth daily.   LORazepam (ATIVAN) 1 MG tablet Take 1 tablet (1 mg total) by mouth 2 (two) times daily as needed for anxiety.   magnesium oxide (MAG-OX) 400 (241.3 MG) MG tablet Take 400 mg by mouth 2 (two) times daily.    metFORMIN (GLUCOPHAGE) 500 MG tablet Take 1 tablet (500 mg total) by mouth 2 (two) times daily with a meal. (Patient taking differently: Take 500 mg by mouth daily with breakfast. Can only tolerate once daily with meal)   metoprolol succinate (TOPROL-XL) 100 MG 24 hr tablet Take 1 tablet (100 mg total) by mouth daily.   mometasone (ELOCON) 0.1 % lotion Apply 4 drops topically at bedtime as needed (dry skin).   Multiple Vitamin (MULTIVITAMIN) tablet Take 0.5 tablets by mouth 2 (two) times daily.   Multiple Vitamins-Minerals (ICAPS) CAPS Take 1 capsule by mouth 2 (two)  times daily.    mupirocin cream (BACTROBAN) 2 % Apply 1 application topically 2 (two) times daily.   nitroGLYCERIN (NITROSTAT) 0.4 MG SL tablet Place 1 tablet (0.4 mg total) under the tongue every 5 (five) minutes as needed for chest pain.   pantoprazole (PROTONIX) 40 MG tablet TAKE 1 TABLET BY MOUTH  DAILY   sucralfate (CARAFATE) 1 g tablet Take 1 tablet (1 g total) by mouth 2 (two) times daily.   VITAMIN D PO Take by mouth daily.    vitamin E (VITAMIN E) 400 UNIT capsule Take 400 Units by mouth daily.   Facility-Administered Encounter Medications as of 11/07/2019  Medication   ondansetron (ZOFRAN) 8 mg, dexamethasone (DECADRON) 10 mg in sodium chloride 0.9 % 50 mL IVPB     Loletha Bertini Jenetta Downer, NP

## 2019-11-09 NOTE — Progress Notes (Deleted)
Established patient visit   Patient: [name] [name]   DOB: [birth year]   79 y.o. Female  MRN: [id number] Visit Date: 11/13/2019  Today's healthcare provider: [name], MD   No chief complaint on file.  Subjective    HPI  ***  {Show patient history (optional):23778::" "}   Medications: Outpatient Medications Prior to Visit  Medication Sig  . albuterol (VENTOLIN HFA) 108 (90 Base) MCG/ACT inhaler Inhale 2 puffs into the lungs every 4 (four) hours as needed for wheezing or shortness of breath.  Marland Kitchen amLODipine (NORVASC) 5 MG tablet TAKE 1 TABLET BY MOUTH EVERY DAY  . aspirin EC 81 MG EC tablet Take 1 tablet (81 mg total) by mouth daily.  Marland Kitchen atorvastatin (LIPITOR) 40 MG tablet TAKE 1 TABLET BY MOUTH  DAILY AT 6 PM.  . Blood Glucose Monitoring Suppl (ACCU-CHEK GUIDE) w/Device KIT USE AS DIRECTED  . clopidogrel (PLAVIX) 75 MG tablet TAKE 1 TABLET BY MOUTH  DAILY  . donepezil (ARICEPT) 5 MG tablet TAKE 1 TABLET BY MOUTH EVERYDAY AT BEDTIME  . fluticasone (FLONASE) 50 MCG/ACT nasal spray Place 2 sprays into both nostrils daily. (Patient taking differently: Place 2 sprays into both nostrils daily as needed for allergies. )  . gabapentin (NEURONTIN) 600 MG tablet TAKE ONE-HALF TABLET BY  MOUTH TWO TIMES DAILY (Patient taking differently: Take 300 mg by mouth 2 (two) times daily. )  . glimepiride (AMARYL) 2 MG tablet TAKE 1 TABLET BY MOUTH TWO  TIMES DAILY  . glucose blood (ACCU-CHEK GUIDE) test strip USE ONCE DAILY AS DIRECTED  . ipratropium-albuterol (DUONEB) 0.5-2.5 (3) MG/3ML SOLN Take 3 mLs by nebulization every 4 (four) hours as needed.  . isosorbide mononitrate (IMDUR) 60 MG 24 hr tablet Take 1 tablet (60 mg total) by mouth daily.  Marland Kitchen LORazepam (ATIVAN) 1 MG tablet Take 1 tablet (1 mg total) by mouth 2 (two) times daily as needed for anxiety.  . magnesium oxide (MAG-OX) 400 (241.3 MG) MG tablet Take 400 mg by mouth 2 (two) times daily.   . metFORMIN (GLUCOPHAGE) 500 MG tablet  Take 1 tablet (500 mg total) by mouth 2 (two) times daily with a meal. (Patient taking differently: Take 500 mg by mouth daily with breakfast. Can only tolerate once daily with meal)  . metoprolol succinate (TOPROL-XL) 100 MG 24 hr tablet Take 1 tablet (100 mg total) by mouth daily.  . mometasone (ELOCON) 0.1 % lotion Apply 4 drops topically at bedtime as needed (dry skin).  . Multiple Vitamin (MULTIVITAMIN) tablet Take 0.5 tablets by mouth 2 (two) times daily.  . Multiple Vitamins-Minerals (ICAPS) CAPS Take 1 capsule by mouth 2 (two) times daily.   . mupirocin cream (BACTROBAN) 2 % Apply 1 application topically 2 (two) times daily.  . nitroGLYCERIN (NITROSTAT) 0.4 MG SL tablet Place 1 tablet (0.4 mg total) under the tongue every 5 (five) minutes as needed for chest pain.  . pantoprazole (PROTONIX) 40 MG tablet TAKE 1 TABLET BY MOUTH  DAILY  . sucralfate (CARAFATE) 1 g tablet Take 1 tablet (1 g total) by mouth 2 (two) times daily.  Marland Kitchen VITAMIN D PO Take by mouth daily.   . vitamin E (VITAMIN E) 400 UNIT capsule Take 400 Units by mouth daily.   Facility-Administered Medications Prior to Visit  Medication Dose Route Frequency Provider  . ondansetron (ZOFRAN) 8 mg, dexamethasone (DECADRON) 10 mg in sodium chloride 0.9 % 50 mL IVPB   Intravenous Once Lloyd Huger, MD  Review of Systems  Constitutional: Negative for appetite change, chills, fatigue and fever.  Respiratory: Negative for chest tightness and shortness of breath.   Cardiovascular: Negative for chest pain and palpitations.  Gastrointestinal: Negative for abdominal pain, nausea and vomiting.  Neurological: Negative for dizziness and weakness.    {Heme  Chem  Endocrine  Serology  Results Review (optional):23779::" "}  Objective    There were no vitals taken for this visit. {Show previous vital signs (optional):23777::" "}  Physical Exam  ***  No results found for any visits on 11/13/19.  Assessment & Plan      ***  No follow-ups on file.      {provider attestation***:1}   [name], MD  [facility] [phone] (phone) [phone] (fax)  [place]

## 2019-11-11 ENCOUNTER — Other Ambulatory Visit: Payer: Self-pay | Admitting: Family Medicine

## 2019-11-11 NOTE — Telephone Encounter (Signed)
Requested prescription is not on active med list. Routing to office.

## 2019-11-13 ENCOUNTER — Ambulatory Visit: Payer: Self-pay | Admitting: Family Medicine

## 2019-11-13 NOTE — Telephone Encounter (Signed)
Pt's husband calling, on DPR, pt present. Reports diffuse rash x 4 days, "On forehead, cheeks and back of neck." Reports "Very slight" swelling right side of face. Denies pain, itches "Only at night if I'm hot." States looked better this morning, but has worsened throughout day as it had been at onset.  No new meds, denies fever, no SOB, no difficulty swallowing. States "Has extremely dry skin." Also reports has had similar rash back of neck in past. No pustules, "No bumps just red." HAs applied Cetaphil cream only.  Appt made with Dr. Ilda Foil for tomorrow. Advised ED if  symptoms worsen, any SOB, difficulty swallowing.  Verbalizes understanding.   Reason for Disposition . Localized rash present > 7 days    Onset 4 days ago  Answer Assessment - Initial Assessment Questions 1. APPEARANCE of RASH: "Describe the rash."      Diffuse red 2. LOCATION: "Where is the rash located?"     Face and back of neck, Forehead, under eyes, cheeks back of neck 3. NUMBER: "How many spots are there?"       4. SIZE: "How big are the spots?" (Inches, centimeters or compare to size of a coin)       5. ONSET: "When did the rash start?"      4 days ago 6. ITCHING: "Does the rash itch?" If Yes, ask: "How bad is the itch?"  (Scale 1-10; or mild, moderate, severe)     *At night "With sweating" 7. PAIN: "Does the rash hurt?" If Yes, ask: "How bad is the pain?"  (Scale 1-10; or mild, moderate, severe)     no 8. OTHER SYMPTOMS: "Do you have any other symptoms?" (e.g., fever)     Very mild facial swelling, right side > left.  Protocols used: RASH OR REDNESS - LOCALIZED-A-AH

## 2019-11-13 NOTE — Progress Notes (Signed)
Established patient visit   Patient: [name] [name]   DOB: 1941/03/14   79 y.o. Female  MRN: [id number] Visit Date: 11/14/2019  Today's healthcare provider: [name], MD   Chief Complaint  Patient presents with  . Rash   Subjective    Rash This is a new problem. The current episode started in the past 7 days. The problem has been waxing and waning since onset. The affected locations include the face and neck. The rash is characterized by redness. She was exposed to nothing. Associated symptoms include rhinorrhea. Pertinent negatives include no facial edema, fatigue, fever, joint pain, nail changes, shortness of breath, sore throat or vomiting. Past treatments include nothing.  Patient and husband states noticed an erythematous rash on her face.  Minimally on the left side.  It is mildly pruritic but not tender Patient has been having increasing problems with reflux.  It is chronic for her and is just a slow crease in her symptoms.  Patient is less mobile but has not worsened in the past month or two   Patient Active Problem List   Diagnosis Date Noted  . Neutropenia ([place]) 09/09/2019  . Drug-induced polyneuropathy ([place]) 09/09/2019  . Inflammatory spondylopathy of lumbosacral region ([place]) 09/09/2019  . COPD (chronic obstructive pulmonary disease) ([place]) 06/06/2019  . GI bleed 12/28/2018  . Syncope 12/27/2018  . Blood per rectum 12/27/2018  . Constipation 12/27/2018  . Chest pain 09/23/2018  . Stroke (cerebrum) ([place]) 05/06/2018  . Malnutrition of moderate degree 03/31/2017  . Acute respiratory failure with hypoxia ([place]) 03/30/2017  . AKI (acute kidney injury) ([place]) 07/15/2016  . Protein-calorie malnutrition, severe 02/08/2016  . Primary cancer of right upper lobe of lung ([place]) 11/20/2015  . Abnormal CT lung screening 10/17/2015  . Personal history of tobacco use, presenting hazards to health 10/15/2015  . Chronic vulvitis 09/26/2014  . Allergic reaction  09/26/2014  . Carotid stenosis 08/30/2014  . Cervical nerve root disorder 08/10/2014  . CAD in native artery 08/10/2014  . B12 deficiency 08/10/2014  . Back ache 08/10/2014  . Bronchitis, chronic ([place]) 08/10/2014  . Diabetes mellitus with polyneuropathy ([place]) 08/10/2014  . Can't get food down 08/10/2014  . Eczema of external ear 08/10/2014  . Accumulation of fluid in tissues 08/10/2014  . Gout 08/10/2014  . Adult hypothyroidism 08/10/2014  . Mononeuritis 08/10/2014  . Muscle ache 08/10/2014  . Disorder of peripheral nervous system 08/10/2014  . Lesion of vulva 08/10/2014  . Cervical spondylosis with radiculopathy 10/19/2013  . COPD exacerbation ([place]) 03/29/2013  . CAD (coronary artery disease) 06/22/2011  . COPD (chronic obstructive pulmonary disease) with emphysema ([place]) 03/07/2010  . CHEST PAIN UNSPECIFIED 07/22/2009  . HLD (hyperlipidemia) 04/01/2009  . Malaise and fatigue 04/01/2009  . TOBACCO ABUSE 01/18/2009  . HYPERTENSION, BENIGN 01/18/2009  . CLAUDICATION 01/18/2009  . Pain in limb 01/18/2009  . CAFL (chronic airflow limitation) ([place]) 01/20/2007  . Late effects of cerebrovascular disease 01/10/2007  . Essential (primary) hypertension 12/22/2006   Past Medical History:  Diagnosis Date  . Abnormal CT lung screening 10/17/2015  . COPD (chronic obstructive pulmonary disease) ([place])   . Coronary artery disease, non-occlusive    a. cath 2006: min nonobs CAD; b. cath 12/2010: cath LAD 50%, RCA 60%; c. 08/2013: Minimal luminal irregs, right dominant system with no significant CAD, diffuse luminal irregs noted. Normal EF 55%, no AS or MS.   . Diabetes mellitus   . Hyperlipemia    Followed by Dr.  Rosanna Randy  . Hypertension   . Lung cancer ([place])   . Macular degeneration    rt  . Personal history of tobacco use, presenting hazards to health 10/15/2015  . Stroke [facility])    Allergies  Allergen Reactions  . Coconut Fatty Acids Swelling and Other (See Comments)    Throat swells        Medications: Outpatient Medications Prior to Visit  Medication Sig  . Accu-Chek FastClix Lancets MISC USE AS DIRECTED  . albuterol (VENTOLIN HFA) 108 (90 Base) MCG/ACT inhaler Inhale 2 puffs into the lungs every 4 (four) hours as needed for wheezing or shortness of breath.  Marland Kitchen amLODipine (NORVASC) 5 MG tablet TAKE 1 TABLET BY MOUTH EVERY DAY  . aspirin EC 81 MG EC tablet Take 1 tablet (81 mg total) by mouth daily.  Marland Kitchen atorvastatin (LIPITOR) 40 MG tablet TAKE 1 TABLET BY MOUTH  DAILY AT 6 PM.  . Blood Glucose Monitoring Suppl (ACCU-CHEK GUIDE) w/Device KIT USE AS DIRECTED  . clopidogrel (PLAVIX) 75 MG tablet TAKE 1 TABLET BY MOUTH  DAILY  . fluticasone (FLONASE) 50 MCG/ACT nasal spray Place 2 sprays into both nostrils daily. (Patient taking differently: Place 2 sprays into both nostrils daily as needed for allergies. )  . gabapentin (NEURONTIN) 600 MG tablet TAKE ONE-HALF TABLET BY  MOUTH TWO TIMES DAILY (Patient taking differently: Take 300 mg by mouth 2 (two) times daily. )  . glimepiride (AMARYL) 2 MG tablet TAKE 1 TABLET BY MOUTH TWO  TIMES DAILY  . glucose blood (ACCU-CHEK GUIDE) test strip USE ONCE DAILY AS DIRECTED  . ipratropium-albuterol (DUONEB) 0.5-2.5 (3) MG/3ML SOLN Take 3 mLs by nebulization every 4 (four) hours as needed.  . isosorbide mononitrate (IMDUR) 60 MG 24 hr tablet Take 1 tablet (60 mg total) by mouth daily.  Marland Kitchen LORazepam (ATIVAN) 1 MG tablet Take 1 tablet (1 mg total) by mouth 2 (two) times daily as needed for anxiety.  . magnesium oxide (MAG-OX) 400 (241.3 MG) MG tablet Take 400 mg by mouth 2 (two) times daily.   . metFORMIN (GLUCOPHAGE) 500 MG tablet Take 1 tablet (500 mg total) by mouth 2 (two) times daily with a meal. (Patient taking differently: Take 500 mg by mouth daily with breakfast. Can only tolerate once daily with meal)  . metoprolol succinate (TOPROL-XL) 100 MG 24 hr tablet Take 1 tablet (100 mg total) by mouth daily.  . mometasone (ELOCON) 0.1 % lotion  Apply 4 drops topically at bedtime as needed (dry skin).  . Multiple Vitamin (MULTIVITAMIN) tablet Take 0.5 tablets by mouth 2 (two) times daily.  . Multiple Vitamins-Minerals (ICAPS) CAPS Take 1 capsule by mouth 2 (two) times daily.   . mupirocin cream (BACTROBAN) 2 % Apply 1 application topically 2 (two) times daily.  . nitroGLYCERIN (NITROSTAT) 0.4 MG SL tablet Place 1 tablet (0.4 mg total) under the tongue every 5 (five) minutes as needed for chest pain.  . pantoprazole (PROTONIX) 40 MG tablet TAKE 1 TABLET BY MOUTH  DAILY  . sucralfate (CARAFATE) 1 g tablet Take 1 tablet (1 g total) by mouth 2 (two) times daily.  Marland Kitchen VITAMIN D PO Take by mouth daily.   . vitamin E (VITAMIN E) 400 UNIT capsule Take 400 Units by mouth daily.  . [DISCONTINUED] donepezil (ARICEPT) 5 MG tablet TAKE 1 TABLET BY MOUTH EVERYDAY AT BEDTIME   Facility-Administered Medications Prior to Visit  Medication Dose Route Frequency Provider  . ondansetron (ZOFRAN) 8 mg, dexamethasone (DECADRON) 10 mg  in sodium chloride 0.9 % 50 mL IVPB   Intravenous Once Lloyd Huger, MD    Review of Systems  Constitutional: Negative for appetite change, chills, fatigue and fever.  HENT: Positive for rhinorrhea. Negative for sore throat.   Respiratory: Negative for chest tightness and shortness of breath.   Cardiovascular: Negative for chest pain and palpitations.  Gastrointestinal: Negative for abdominal pain, nausea and vomiting.  Musculoskeletal: Negative for joint pain.  Skin: Positive for rash. Negative for nail changes.  Neurological: Negative for dizziness and weakness.       Objective    BP 119/64   Pulse 60   Temp 98.1 F (36.7 C) (Oral)   Resp 15   Wt 135 lb (61.2 kg)   BMI 23.17 kg/m  BP Readings from Last 3 Encounters:  11/14/19 119/64  09/12/19 119/74  09/05/19 112/67   Wt Readings from Last 3 Encounters:  11/14/19 135 lb (61.2 kg)  09/05/19 110 lb (49.9 kg)  06/12/19 135 lb (61.2 kg)       Physical Exam Vitals and nursing note reviewed.  Constitutional:      Appearance: Normal appearance. She is normal weight.  Cardiovascular:     Rate and Rhythm: Normal rate and regular rhythm.     Pulses: Normal pulses.     Heart sounds: Normal heart sounds.  Pulmonary:     Effort: Pulmonary effort is normal.     Breath sounds: Normal breath sounds.  Abdominal:     General: Bowel sounds are normal.     Palpations: Abdomen is soft.  Musculoskeletal:     Cervical back: Normal range of motion and neck supple.  Skin:    General: Skin is warm and dry.     Comments: Very dry skin eczematous type skin changes.  Possible erythema of left side of face.  Neurological:     Mental Status: She is alert and oriented to person, place, and time.  Psychiatric:        Mood and Affect: Mood normal.        Behavior: Behavior normal.       No results found for any visits on 11/14/19.  Assessment & Plan     1. Eczema, unspecified type Skin moisturizer and over-the-counter hydrocortisone may help  2. Erysipelas Treat as erysipelas did not have a classic exam today.  This is what the patient and husband are describing. - amoxicillin (AMOXIL) 500 MG capsule; Take 1 capsule (500 mg total) by mouth 2 (two) times daily.  Dispense: 14 capsule; Refill: 0  3. Gastroesophageal reflux disease with esophagitis without hemorrhage Double PPI dose. - pantoprazole (PROTONIX) 40 MG tablet; Take 1 tablet (40 mg total) by mouth 2 (two) times daily.  Dispense: 180 tablet; Refill:  4. Malnutrition of moderate degree Encourage patient to eat anything that appeals to her.  5. Chronic obstructive pulmonary disease, unspecified COPD type ([place]) Patient is quit smoking.  6. Primary cancer of right upper lobe of lung ([place]) Followed by oncology.  7. Failure to thrive in adult Palliative care following patient.   No follow-ups on file.         Stewart Pimenta Cranford Mon, MD  [facility] [phone] (phone) [phone] (fax)  [place]

## 2019-11-13 NOTE — Telephone Encounter (Signed)
FYI

## 2019-11-14 ENCOUNTER — Other Ambulatory Visit: Payer: Self-pay

## 2019-11-14 ENCOUNTER — Ambulatory Visit (INDEPENDENT_AMBULATORY_CARE_PROVIDER_SITE_OTHER): Payer: Medicare Other | Admitting: Family Medicine

## 2019-11-14 ENCOUNTER — Encounter: Payer: Self-pay | Admitting: Family Medicine

## 2019-11-14 VITALS — BP 119/64 | HR 60 | Temp 98.1°F | Resp 15 | Wt 135.0 lb

## 2019-11-14 DIAGNOSIS — R627 Adult failure to thrive: Secondary | ICD-10-CM

## 2019-11-14 DIAGNOSIS — E440 Moderate protein-calorie malnutrition: Secondary | ICD-10-CM

## 2019-11-14 DIAGNOSIS — A46 Erysipelas: Secondary | ICD-10-CM | POA: Diagnosis not present

## 2019-11-14 DIAGNOSIS — K2100 Gastro-esophageal reflux disease with esophagitis, without bleeding: Secondary | ICD-10-CM | POA: Diagnosis not present

## 2019-11-14 DIAGNOSIS — J449 Chronic obstructive pulmonary disease, unspecified: Secondary | ICD-10-CM

## 2019-11-14 DIAGNOSIS — L309 Dermatitis, unspecified: Secondary | ICD-10-CM

## 2019-11-14 DIAGNOSIS — C3411 Malignant neoplasm of upper lobe, right bronchus or lung: Secondary | ICD-10-CM

## 2019-11-14 MED ORDER — AMOXICILLIN 500 MG PO CAPS: 500.0000 mg | ORAL_CAPSULE | Freq: Two times a day (BID) | ORAL | 0 refills | Status: DC

## 2019-11-14 MED ORDER — PANTOPRAZOLE SODIUM 40 MG PO TBEC: 40.0000 mg | DELAYED_RELEASE_TABLET | Freq: Two times a day (BID) | ORAL | 3 refills | Status: DC

## 2019-11-14 NOTE — Patient Instructions (Signed)
Eczema Eczema is a broad term for a group of skin conditions that cause skin to become rough and inflamed. Each type of eczema has different triggers, symptoms, and treatments. Eczema of any type is usually itchy and symptoms range from mild to severe. Eczema and its symptoms are not spread from person to person (are not contagious). It can appear on different parts of the body at different times. Your eczema may not look the same as someone else's eczema. What are the types of eczema? Atopic dermatitis This is a long-term (chronic) skin disease that keeps coming back (recurring). Usual symptoms are dry skin and small, solid pimples that may swell and leak fluid (weep). Contact dermatitis  This happens when something irritates the skin and causes a rash. The irritation can come from substances that you are allergic to (allergens), such as poison ivy, chemicals, or medicines that were applied to your skin. Dyshidrotic eczema This is a form of eczema on the hands and feet. It shows up as very itchy, fluid-filled blisters. It can affect people of any age, but is more common before age 22. Hand eczema  This causes very itchy areas of skin on the palms and sides of the hands and fingers. This type of eczema is common in industrial jobs where you may be exposed to many different types of irritants. Lichen simplex chronicus This type of eczema occurs when a person constantly scratches one area of the body. Repeated scratching of the area leads to thickened skin (lichenification). Lichen simplex chronicus can occur along with other types of eczema. It is more common in adults, but may be seen in children as well. Nummular eczema This is a common type of eczema. It has no known cause. It typically causes a red, circular, crusty lesion (plaque) that may be itchy. Scratching may become a habit and can cause bleeding. Nummular eczema occurs most often in people of middle-age or older. It most often affects the  hands. Seborrheic dermatitis This is a common skin disease that mainly affects the scalp. It may also affect any oily areas of the body, such as the face, sides of nose, eyebrows, ears, eyelids, and chest. It is marked by small scaling and redness of the skin (erythema). This can affect people of all ages. In infants, this condition is known as Chart[employer] certified accountant." Stasis dermatitis This is a common skin disease that usually appears on the legs and feet. It most often occurs in people who have a condition that prevents blood from being pumped through the veins in the legs (chronic venous insufficiency). Stasis dermatitis is a chronic condition that needs long-term management. How is eczema diagnosed? Your health care provider will examine your skin and review your medical history. He or she may also give you skin patch tests. These tests involve taking patches that contain possible allergens and placing them on your back. He or she will then check in a few days to see if an allergic reaction occurred. What are the common treatments? Treatment for eczema is based on the type of eczema you have. Hydrocortisone steroid medicine can relieve itching quickly and help reduce inflammation. This medicine may be prescribed or obtained over-the-counter, depending on the strength of the medicine that is needed. Follow these instructions at home:  Take over-the-counter and prescription medicines only as told by your health care provider.  Use creams or ointments to moisturize your skin. Do not use lotions.  Learn what triggers or irritates your symptoms. Avoid these things.  Treat symptom flare-ups quickly.  Do not itch your skin. This can make your rash worse.  Keep all follow-up visits as told by your health care provider. This is important. Where to find more information  The American Academy of Dermatology: http://jones-macias.info/  The National Eczema Association: www.nationaleczema.org Contact a health care provider  if:  You have serious itching, even with treatment.  You regularly scratch your skin until it bleeds.  Your rash looks different than usual.  Your skin is painful, swollen, or more red than usual.  You have a fever. Summary  There are eight general types of eczema. Each type has different triggers.  Eczema of any type causes itching that may range from mild to severe.  Treatment varies based on the type of eczema you have. Hydrocortisone steroid medicine can help with itching and inflammation.  Protecting your skin is the best way to prevent eczema. Use moisturizers and lotions. Avoid triggers and irritants, and treat flare-ups quickly. This information is not intended to replace advice given to you by your health care provider. Make sure you discuss any questions you have with your health care provider. Document Revised: 03/05/2017 Document Reviewed: 08/06/2016 Elsevier Patient Education  [place] is an infection that affects the skin and tissues near the surface of the skin. It causes the skin to become red, swollen, and painful. The infection is most common on the legs but may also affect other areas, such as the face. With treatment, the infection usually goes away in a few days. If not treated, the infection can spread or lead to other problems, such as collections of pus (abscesses). What are the causes? This condition is caused by bacteria. Most often, it is caused by bacteria called streptococci.  Bacteria may get into the skin through a break in the skin, such as:  A cut or scrape.  An incision from surgery.  A burn.  An insect bite.  An open sore.  A crack in the skin. Sometimes, how the bacteria infected the skin is not known. What increases the risk? You are more likely to develop this condition if you:  Are a young child.  Are older. Elderly people are more likely to get erysipelas.  Have a weakened  disease-fighting system (immune system), such as if you have HIV or AIDS.  Have diabetes.  Drink a lot of alcohol.  Recently had surgery.  Have a yeast infection of the skin.  Have swollen legs. What are the signs or symptoms? The infection causes a reddened area on the skin. This reddened area may:  Be painful and swollen.  Have a clear border around it.  Feel itchy and hot.  Develop blisters or abscesses. Other symptoms may include:  Fever.  Chills.  Nausea and vomiting.  Swollen glands (lymph nodes), such as in the neck.  Headache.  Fatigue.  Loss of appetite. How is this diagnosed? This condition is diagnosed based on:  A physical exam. Your health care provider will examine your skin closely.  Your symptoms and medical history. How is this treated?  This condition is treated with antibiotic medicine. Symptoms usually get better within a few days after starting antibiotics. Follow these instructions at home: Medicines  Take your antibiotic medicine as told by your health care provider. Do not stop taking the antibiotic even if your condition starts to improve.  Take other over-the-counter and prescription medicines only as told by your health care provider.  Ask your health care provider if it is safe for you to take medicines for pain as needed, such as acetaminophen or ibuprofen. General instructions  If the affected area is on an arm or leg, raise (elevate) the affected arm or leg above the level of your heart while you are sitting or lying down.  Do not put any creams or lotions on the affected area of your skin unless your health care provider tells you to do that.  Do not share bedding, towels, or washcloths (linens) with other people. Use only your own linens to prevent the infection from spreading to others.  Keep all follow-up visits as told by your health care provider. This is important. Contact a health care provider if:  Your symptoms  do not improve within 1-2 days of starting treatment.  You develop new symptoms.  You have a fever.  You have a general ill feeling (malaise) with muscle aches and pains. Get help right away if:  Your symptoms get worse.  You develop vomiting or diarrhea that persists.  Your red area gets larger or turns dark in color.  You notice red streaks coming from the infected area. Summary  Erysipelas is an infection that affects the skin and tissues near the surface of the skin. It causes the skin to become red, swollen, and painful.  This condition is caused by bacteria. Most often, it is caused by bacteria called streptococci.  Bacteria may get into the skin through a break in the skin. Sometimes, how the bacteria infected the skin is not known.  This condition is treated with antibiotic medicine. Symptoms usually get better within a few days after starting antibiotics. This information is not intended to replace advice given to you by your health care provider. Make sure you discuss any questions you have with your health care provider. Document Revised: 03/16/2017 Document Reviewed: 03/16/2017 Elsevier Patient Education  [place].    Amoxicillin capsules or tablets What is this medicine? AMOXICILLIN (a mox i SIL in) is a penicillin antibiotic. It is used to treat certain kinds of bacterial infections. It will not work for colds, flu, or other viral infections. This medicine may be used for other purposes; ask your health care provider or pharmacist if you have questions. COMMON BRAND NAME(S): Amoxil, Moxilin, Sumox, Trimox What should I tell my health care provider before I take this medicine? They need to know if you have any of these conditions:  kidney disease  an unusual or allergic reaction to amoxicillin, other penicillins, cephalosporin antibiotics, other medicines, foods, dyes, or preservatives  pregnant or trying to get pregnant  breast-feeding How should  I use this medicine? Take this medicine by mouth with a glass of water. Follow the directions on your prescription label. You can take it with or without food. If it upsets your stomach, take it with food. Take your medicine at regular intervals. Do not take your medicine more often than directed. Take all of your medicine as directed even if you think you are better. Do not skip doses or stop your medicine early. Talk to your pediatrician regarding the use of this medicine in children. While this drug may be prescribed for selected conditions, precautions do apply. Overdosage: If you think you have taken too much of this medicine contact a poison control center or emergency room at once. NOTE: This medicine is only for you. Do not share this medicine with others. What if I miss a dose? If you miss  a dose, take it as soon as you can. If it is almost time for your next dose, take only that dose. Do not take double or extra doses. What may interact with this medicine?  allopurinol  birth control pills  certain antibiotics like chloramphenicol, erythromycin, sulfamethoxazole, tetracycline  certain medicines that treat or prevent blood clots like warfarin This list may not describe all possible interactions. Give your health care provider a list of all the medicines, herbs, non-prescription drugs, or dietary supplements you use. Also tell them if you smoke, drink alcohol, or use illegal drugs. Some items may interact with your medicine. What should I watch for while using this medicine? Tell your health care professional if your symptoms do not start to get better or if they get worse. Do not treat diarrhea with over the counter products. Contact your health care professional if you have diarrhea that lasts more than 2 days or if it is severe and watery. If you have diabetes, you may get a false-positive result for sugar in your urine. Check with your health care professional. Birth control may not  work properly while you are taking this medicine. Talk to your health care professional about using an extra method of birth control. This medicine may cause serious skin reactions. They can happen weeks to months after starting the medicine. Contact your health care provider right away if you notice fevers or flu-like symptoms with a rash. The rash may be red or purple and then turn into blisters or peeling of the skin. Or, you might notice a red rash with swelling of the face, lips or lymph nodes in your neck or under your arms. What side effects may I notice from receiving this medicine? Side effects that you should report to your doctor or health care professional as soon as possible:  allergic reactions like skin rash, itching or hives, swelling of the face, lips, or tongue  bloody or watery diarrhea  breathing problems  feeling faint; lightheaded, falls  fever  redness, blistering, peeling or loosening of the skin, including inside the mouth  seizures  signs and symptoms of kidney injury like trouble passing urine or change in the amount of urine  signs and symptoms of liver injury like dark yellow or brown urine; general ill feeling or flu-like symptoms; light-colored stools; loss of appetite; nausea; right upper belly pain; unusually weak or tired; yellowing of the eyes or skin  unusual bleeding or bruising  unusually weak or tired Side effects that usually do not require medical attention (report to your doctor or health care professional if they continue or are bothersome):  anxious  confusion  diarrhea  dizziness  headache  nausea, vomiting  stomach upset  trouble sleeping This list may not describe all possible side effects. Call your doctor for medical advice about side effects. You may report side effects to FDA at 1-800-FDA-1088. Where should I keep my medicine? Keep out of the reach of children. Store at room temperature between 20 and 25 degrees C (68  and 77 degrees F). Throw away any unused medicine after the expiration date. NOTE: This sheet is a summary. It may not cover all possible information. If you have questions about this medicine, talk to your doctor, pharmacist, or health care provider.  2020 Elsevier/Gold Standard (2018-06-03 14:32:29)

## 2019-11-18 ENCOUNTER — Other Ambulatory Visit: Payer: Self-pay | Admitting: Family Medicine

## 2019-11-18 DIAGNOSIS — I10 Essential (primary) hypertension: Secondary | ICD-10-CM

## 2019-11-18 NOTE — Telephone Encounter (Signed)
Requested Prescriptions  Pending Prescriptions Disp Refills  . amLODipine (NORVASC) 5 MG tablet [Pharmacy Med Name: AMLODIPINE BESYLATE 5 MG TAB] 90 tablet 0    Sig: TAKE 1 TABLET BY MOUTH EVERY DAY     Cardiovascular:  Calcium Channel Blockers Passed - 11/18/2019 12:47 PM      Passed - Last BP in normal range    BP Readings from Last 1 Encounters:  11/14/19 119/64         Passed - Valid encounter within last 6 months    Recent Outpatient Visits          4 days ago Eczema, unspecified type   [facility] Jerrol Banana., MD   2 months ago Failure to thrive in adult   [facility] Rosanna Randy, Retia Passe., MD   2 months ago MCI (mild cognitive impairment)   [facility] Jerrol Banana., MD   5 months ago Skin ulcer of sacrum, limited to breakdown of skin [facility])   [place], [place], [place]   5 months ago Essential hypertension, benign   [facility] Jerrol Banana., MD      Future Appointments            In 3 weeks Jerrol Banana., MD [facility], [place]   In 1 month [name], MD [facility] [place]   In 3 months Drain, Kathlene November, MD [facility], [place]

## 2019-11-24 DIAGNOSIS — J449 Chronic obstructive pulmonary disease, unspecified: Secondary | ICD-10-CM | POA: Diagnosis not present

## 2019-11-24 DIAGNOSIS — R0602 Shortness of breath: Secondary | ICD-10-CM | POA: Diagnosis not present

## 2019-11-26 DIAGNOSIS — J449 Chronic obstructive pulmonary disease, unspecified: Secondary | ICD-10-CM | POA: Diagnosis not present

## 2019-12-07 ENCOUNTER — Telehealth: Payer: Self-pay

## 2019-12-07 NOTE — Telephone Encounter (Signed)
Copied from [place] [phone]. Topic: General - Call Back - No Documentation >> Dec 07, 2019  2:44 PM Gillis Ends D wrote: Reason for CRM: Patients husband would like for the doctor or the nurse to give him a call back about the Amoxicillin that was given to her for red skin. Please advise the patient.

## 2019-12-07 NOTE — Telephone Encounter (Signed)
Spoke to [place] said that the amoxicillin did help the patient but her face and neck are still red but not as bad as it was so he would like to know if there is something else you could prescribe for the patient to take on an everyday basis for her to use/take that is not as strong as the amoxicillin. Please advise?

## 2019-12-08 ENCOUNTER — Other Ambulatory Visit: Payer: Self-pay | Admitting: Family Medicine

## 2019-12-08 DIAGNOSIS — G3184 Mild cognitive impairment, so stated: Secondary | ICD-10-CM

## 2019-12-12 ENCOUNTER — Ambulatory Visit: Payer: Self-pay | Admitting: Family Medicine

## 2019-12-13 NOTE — Telephone Encounter (Signed)
Patient advised.

## 2019-12-13 NOTE — Telephone Encounter (Signed)
Try topical MetroGel to affected areas of face twice a day.

## 2019-12-14 ENCOUNTER — Other Ambulatory Visit: Payer: Self-pay | Admitting: Family Medicine

## 2019-12-14 DIAGNOSIS — I6523 Occlusion and stenosis of bilateral carotid arteries: Secondary | ICD-10-CM

## 2019-12-14 NOTE — Telephone Encounter (Signed)
Pt's husband called and reported that the pt took her last pills today, wanted PCP to know

## 2019-12-22 ENCOUNTER — Other Ambulatory Visit: Payer: Self-pay | Admitting: *Deleted

## 2019-12-22 DIAGNOSIS — C3411 Malignant neoplasm of upper lobe, right bronchus or lung: Secondary | ICD-10-CM

## 2019-12-25 ENCOUNTER — Inpatient Hospital Stay: Payer: Medicare Other | Attending: Oncology

## 2019-12-25 ENCOUNTER — Other Ambulatory Visit: Payer: Self-pay

## 2019-12-25 ENCOUNTER — Ambulatory Visit
Admission: RE | Admit: 2019-12-25 | Discharge: 2019-12-25 | Disposition: A | Discharge status: Home / Self Care | Payer: Medicare Other | Source: Ambulatory Visit | Attending: Oncology | Admitting: Oncology

## 2019-12-25 DIAGNOSIS — C3411 Malignant neoplasm of upper lobe, right bronchus or lung: Secondary | ICD-10-CM

## 2019-12-25 DIAGNOSIS — J439 Emphysema, unspecified: Secondary | ICD-10-CM | POA: Diagnosis not present

## 2019-12-25 DIAGNOSIS — I2510 Atherosclerotic heart disease of native coronary artery without angina pectoris: Secondary | ICD-10-CM | POA: Diagnosis not present

## 2019-12-25 DIAGNOSIS — I700 Atherosclerosis of aorta: Secondary | ICD-10-CM | POA: Diagnosis not present

## 2019-12-25 DIAGNOSIS — R0602 Shortness of breath: Secondary | ICD-10-CM | POA: Diagnosis not present

## 2019-12-25 DIAGNOSIS — J449 Chronic obstructive pulmonary disease, unspecified: Secondary | ICD-10-CM | POA: Diagnosis not present

## 2019-12-25 LAB — CBC WITH DIFFERENTIAL/PLATELET
Abs Immature Granulocytes: 0.04 10*3/uL (ref 0.00–0.07)
Basophils Absolute: 0 10*3/uL (ref 0.0–0.1)
Basophils Relative: 0 %
Eosinophils Absolute: 0.2 10*3/uL (ref 0.0–0.5)
Eosinophils Relative: 2 %
HCT: 38.8 % (ref 36.0–46.0)
Hemoglobin: 12.8 g/dL (ref 12.0–15.0)
Immature Granulocytes: 1 %
Lymphocytes Relative: 9 %
Lymphs Abs: 0.8 10*3/uL (ref 0.7–4.0)
MCH: 30.5 pg (ref 26.0–34.0)
MCHC: 33 g/dL (ref 30.0–36.0)
MCV: 92.4 fL (ref 80.0–100.0)
Monocytes Absolute: 0.6 10*3/uL (ref 0.1–1.0)
Monocytes Relative: 7 %
Neutro Abs: 7.1 10*3/uL (ref 1.7–7.7)
Neutrophils Relative %: 81 %
Platelets: 246 10*3/uL (ref 150–400)
RBC: 4.2 MIL/uL (ref 3.87–5.11)
RDW: 14.6 % (ref 11.5–15.5)
WBC: 8.7 10*3/uL (ref 4.0–10.5)
nRBC: 0 % (ref 0.0–0.2)

## 2019-12-25 LAB — COMPREHENSIVE METABOLIC PANEL
ALT: 16 U/L (ref 0–44)
AST: 18 U/L (ref 15–41)
Albumin: 4.2 g/dL (ref 3.5–5.0)
Alkaline Phosphatase: 69 U/L (ref 38–126)
Anion gap: 12 (ref 5–15)
BUN: 27 mg/dL — ABNORMAL HIGH (ref 8–23)
CO2: 32 mmol/L (ref 22–32)
Calcium: 9 mg/dL (ref 8.9–10.3)
Chloride: 97 mmol/L — ABNORMAL LOW (ref 98–111)
Creatinine, Ser: 1.47 mg/dL — ABNORMAL HIGH (ref 0.44–1.00)
GFR calc Af Amer: 39 mL/min — ABNORMAL LOW (ref >60)
GFR calc non Af Amer: 34 mL/min — ABNORMAL LOW (ref >60)
Glucose, Bld: 125 mg/dL — ABNORMAL HIGH (ref 70–99)
Potassium: 4.5 mmol/L (ref 3.5–5.1)
Sodium: 141 mmol/L (ref 135–145)
Total Bilirubin: 0.7 mg/dL (ref 0.3–1.2)
Total Protein: 7.8 g/dL (ref 6.5–8.1)

## 2019-12-25 MED ORDER — IOHEXOL 300 MG/ML  SOLN
60.0000 mL | Freq: Once | INTRAMUSCULAR | Status: AC | PRN
  Administered 2019-12-25: 60 mL via INTRAVENOUS

## 2019-12-27 DIAGNOSIS — J449 Chronic obstructive pulmonary disease, unspecified: Secondary | ICD-10-CM | POA: Diagnosis not present

## 2019-12-29 NOTE — Progress Notes (Signed)
[place]  Telephone:([phone]) [phone] [phone]) [phone]  ID: KERSTIE AGENT OB: Aug 24, 1940  MR#: [id number]  HKV#:[id number]  Patient Care Team: Jerrol Banana., MD as PCP - General (Unknown Physician Specialty) [name], MD as PCP - Cardiology (Cardiology) [name], MD as Consulting Physician (Vascular Surgery) [place], [place] as Consulting Physician (Optometry) [name], Kathlene November, MD as Consulting Physician (Oncology) [name], MD as Referring Physician (Radiation Oncology) Murlean Iba, MD as Consulting Physician (Internal Medicine) Cathi Roan, [facility] (Pharmacist)  I connected with Ardelle Lesches Connelly on 01/04/20 at 11:00 AM EDT by telephone visit and verified that I am speaking with the correct person using two identifiers.   I discussed the limitations, risks, security and privacy concerns of performing an evaluation and management service by telemedicine and the availability of in-person appointments. I also discussed with the patient that there may be a patient responsible charge related to this service. The patient expressed understanding and agreed to proceed.   Other persons participating in the visit and their role in the encounter: Patient, MD.  Patient's location: Home. Provider's location: Clinic.  CHIEF COMPLAINT: Clinical stage IIIa small cell lung carcinoma of the right upper lobe lung.  INTERVAL HISTORY: Patient agreed to telephone visit for further evaluation and discussion of her imaging results.  She is essentially wheelchair-bound and requires oxygen 24 hours a day and is difficult to get into clinic.  Patient also reports she does not have the ability to do video visit.  She currently is at her baseline. She has a persistent peripheral neuropathy, but no other neurologic complaints.  She continues to have chronic weakness and fatigue. She denies any recent fevers or illnesses.  She denies any chest pain, cough, or  hemoptysis.  She has chronic shortness of breath.  She has a good appetite and denies weight loss. She denies any nausea, vomiting, constipation, or diarrhea. She has no urinary complaints.  Patient offers no further specific complaints today.  REVIEW OF SYSTEMS:   Review of Systems  Constitutional: Positive for malaise/fatigue. Negative for fever and weight loss.  Respiratory: Positive for shortness of breath. Negative for cough and hemoptysis.   Cardiovascular: Negative.  Negative for chest pain and leg swelling.  Gastrointestinal: Negative for abdominal pain, melena, nausea and vomiting.  Genitourinary: Negative.  Negative for dysuria.  Musculoskeletal: Negative.  Negative for falls.  Skin: Negative.  Negative for rash.  Neurological: Positive for dizziness, tingling, sensory change and weakness. Negative for focal weakness and headaches.  Psychiatric/Behavioral: Negative.  The patient is not nervous/anxious.     As per HPI. Otherwise, a complete review of systems is negative.  PAST MEDICAL HISTORY: Past Medical History:  Diagnosis Date  . Abnormal CT lung screening 10/17/2015  . COPD (chronic obstructive pulmonary disease) ([place])   . Coronary artery disease, non-occlusive    a. cath 2006: min nonobs CAD; b. cath 12/2010: cath LAD 50%, RCA 60%; c. 08/2013: Minimal luminal irregs, right dominant system with no significant CAD, diffuse luminal irregs noted. Normal EF 55%, no AS or MS.   . Diabetes mellitus   . Hyperlipemia    Followed by Dr. Rosanna Randy  . Hypertension   . Lung cancer ([place])   . Macular degeneration    rt  . Personal history of tobacco use, presenting hazards to health 10/15/2015  . Stroke [facility])     PAST SURGICAL HISTORY: Past Surgical History:  Procedure Laterality Date  . ABDOMINAL HYSTERECTOMY    .  ANTERIOR CERVICAL DECOMP/DISCECTOMY FUSION N/A 10/19/2013   Procedure: CERVICAL FIVE-SIX ANTERIOR CERVICAL DECOMPRESSION WITH FUSION INTERBODY PROSTHESIS PLATING AND PEEK  CAGE;  Surgeon: Ophelia Charter, MD;  Location: [place] NEURO ORS;  Service: Neurosurgery;  Laterality: N/A;  . BACK SURGERY  80's  . BREAST CYST EXCISION Left    left negative   . CARDIAC CATHETERIZATION  05/2004  . CATARACT EXTRACTION Left   . CHOLECYSTECTOMY    . COLONOSCOPY N/A 12/29/2018   Procedure: COLONOSCOPY;  Surgeon: Toledo, Benay Pike, MD;  Location: [facility] ENDOSCOPY;  Service: Gastroenterology;  Laterality: N/A;  . ENDARTERECTOMY Left 10/24/2014   Procedure: ENDARTERECTOMY CAROTID;  Surgeon: Algernon Huxley, MD;  Location: [facility] ORS;  Service: Vascular;  Laterality: Left;  . ENDOBRONCHIAL ULTRASOUND N/A 11/14/2015   Procedure: ENDOBRONCHIAL ULTRASOUND;  Surgeon: [name], MD;  Location: [facility] ORS;  Service: Cardiopulmonary;  Laterality: N/A;  . PERIPHERAL VASCULAR CATHETERIZATION N/A 12/04/2015   Procedure: Glori Luis Cath Insertion;  Surgeon: Algernon Huxley, MD;  Location: [place] CV LAB;  Service: Cardiovascular;  Laterality: N/A;  . PORTA CATH REMOVAL N/A 05/17/2017   Procedure: PORTA CATH REMOVAL;  Surgeon: Algernon Huxley, MD;  Location: [place] CV LAB;  Service: Cardiovascular;  Laterality: N/A;  . TONSILLECTOMY AND ADENOIDECTOMY    . VESICOVAGINAL FISTULA CLOSURE W/ TAH      FAMILY HISTORY: Family History  Problem Relation Age of Onset  . Stroke Mother   . Heart attack Father   . Heart attack Brother   . Heart attack Brother   . Lung cancer Maternal Grandfather   . Heart attack Paternal Grandmother        MI       ADVANCED DIRECTIVES (Y/N):  N   HEALTH MAINTENANCE: Social History   Tobacco Use  . Smoking status: Former Smoker    Packs/day: 1.00    Years: 50.00    Pack years: 50.00    Types: Cigarettes    Quit date: 10/03/2015    Years since quitting: 4.2  . Smokeless tobacco: Never Used  . Tobacco comment: smokes 3 cigs daily 05/06/15. Pt instructed to quit.  Vaping Use  . Vaping Use: Never used  Substance Use Topics  . Alcohol use: No    Alcohol/week: 0.0  standard drinks  . Drug use: No     Colonoscopy:  PAP:  Bone density:  Lipid panel:  Allergies  Allergen Reactions  . Coconut Fatty Acids Swelling and Other (See Comments)    Throat swells    Current Outpatient Medications  Medication Sig Dispense Refill  . Accu-Chek FastClix Lancets MISC USE AS DIRECTED 102 each 1  . albuterol (VENTOLIN HFA) 108 (90 Base) MCG/ACT inhaler Inhale 2 puffs into the lungs every 4 (four) hours as needed for wheezing or shortness of breath. 8 g 11  . amLODipine (NORVASC) 5 MG tablet TAKE 1 TABLET BY MOUTH EVERY DAY 90 tablet 0  . aspirin EC 81 MG EC tablet Take 1 tablet (81 mg total) by mouth daily. 30 tablet 0  . atorvastatin (LIPITOR) 40 MG tablet TAKE 1 TABLET BY MOUTH  DAILY AT 6 PM. 90 tablet 3  . Blood Glucose Monitoring Suppl (ACCU-CHEK GUIDE) w/Device KIT USE AS DIRECTED 1 kit 0  . clopidogrel (PLAVIX) 75 MG tablet TAKE 1 TABLET BY MOUTH  DAILY 90 tablet 3  . fluticasone (FLONASE) 50 MCG/ACT nasal spray Place 2 sprays into both nostrils daily. (Patient taking differently: Place 2 sprays into both nostrils daily  as needed for allergies. ) 16 g 6  . gabapentin (NEURONTIN) 600 MG tablet TAKE ONE-HALF TABLET BY  MOUTH TWO TIMES DAILY (Patient taking differently: Take 300 mg by mouth 2 (two) times daily. ) 90 tablet 3  . glimepiride (AMARYL) 2 MG tablet TAKE 1 TABLET BY MOUTH TWO  TIMES DAILY 180 tablet 3  . glucose blood (ACCU-CHEK GUIDE) test strip USE ONCE DAILY AS DIRECTED 50 strip 5  . ipratropium-albuterol (DUONEB) 0.5-2.5 (3) MG/3ML SOLN Take 3 mLs by nebulization every 4 (four) hours as needed. 360 mL 1  . isosorbide mononitrate (IMDUR) 60 MG 24 hr tablet Take 1 tablet (60 mg total) by mouth daily. 90 tablet 3  . LORazepam (ATIVAN) 1 MG tablet Take 1 tablet (1 mg total) by mouth 2 (two) times daily as needed for anxiety. 30 tablet 5  . magnesium oxide (MAG-OX) 400 (241.3 MG) MG tablet Take 400 mg by mouth 2 (two) times daily.     . metFORMIN  (GLUCOPHAGE) 500 MG tablet Take 1 tablet (500 mg total) by mouth 2 (two) times daily with a meal. (Patient taking differently: Take 500 mg by mouth daily with breakfast. Can only tolerate once daily with meal) 180 tablet 3  . metoprolol succinate (TOPROL-XL) 100 MG 24 hr tablet Take 1 tablet (100 mg total) by mouth daily. 90 tablet 3  . mometasone (ELOCON) 0.1 % lotion Apply 4 drops topically at bedtime as needed (dry skin). 60 mL 0  . Multiple Vitamin (MULTIVITAMIN) tablet Take 0.5 tablets by mouth 2 (two) times daily.    . Multiple Vitamins-Minerals (ICAPS) CAPS Take 1 capsule by mouth 2 (two) times daily.     . mupirocin cream (BACTROBAN) 2 % Apply 1 application topically 2 (two) times daily. 15 g 0  . nitroGLYCERIN (NITROSTAT) 0.4 MG SL tablet PLACE 1 TABLET UNDER THE TONGUE EVERY 5 MINUTES AS NEEDED FOR CHEST PAIN 75 tablet 0  . pantoprazole (PROTONIX) 40 MG tablet Take 1 tablet (40 mg total) by mouth 2 (two) times daily. 180 tablet 3  . sucralfate (CARAFATE) 1 g tablet Take 1 tablet (1 g total) by mouth 2 (two) times daily. 180 tablet 3  . VITAMIN D PO Take by mouth daily.     . vitamin E (VITAMIN E) 400 UNIT capsule Take 400 Units by mouth daily.     No current facility-administered medications for this visit.   Facility-Administered Medications Ordered in Other Visits  Medication Dose Route Frequency Provider Last Rate Last Admin  . ondansetron (ZOFRAN) 8 mg, dexamethasone (DECADRON) 10 mg in sodium chloride 0.9 % 50 mL IVPB   Intravenous Once Lloyd Huger, MD        OBJECTIVE: There were no vitals filed for this visit.   There is no height or weight on file to calculate BMI.    ECOG FS:2 - Symptomatic, <50% confined to bed  LAB RESULTS:  Lab Results  Component Value Date   NA 141 12/25/2019   K 4.5 12/25/2019   CL 97 (L) 12/25/2019   CO2 32 12/25/2019   GLUCOSE 125 (H) 12/25/2019   BUN 27 (H) 12/25/2019   CREATININE 1.47 (H) 12/25/2019   CALCIUM 9.0 12/25/2019   PROT  7.8 12/25/2019   ALBUMIN 4.2 12/25/2019   AST 18 12/25/2019   ALT 16 12/25/2019   ALKPHOS 69 12/25/2019   BILITOT 0.7 12/25/2019   GFRNONAA 34 (L) 12/25/2019   GFRAA 39 (L) 12/25/2019    Lab Results  Component Value Date   WBC 8.7 12/25/2019   NEUTROABS 7.1 12/25/2019   HGB 12.8 12/25/2019   HCT 38.8 12/25/2019   MCV 92.4 12/25/2019   PLT 246 12/25/2019     STUDIES: CT Chest W Contrast  Result Date: 12/25/2019 CLINICAL DATA:  Right upper lobe lung cancer restaging. Prior chemotherapy and radiation therapy. EXAM: CT CHEST WITH CONTRAST TECHNIQUE: Multidetector CT imaging of the chest was performed during intravenous contrast administration. CONTRAST:  42m OMNIPAQUE IOHEXOL 300 MG/ML  SOLN COMPARISON:  Chest CT 12/22/2018 FINDINGS: Cardiovascular: Coronary, aortic arch, and branch vessel atherosclerotic vascular disease. Mediastinum/Nodes: Stable upper normal sized right hilar and left infrahilar lymph nodes without overtly pathologically enlarged adenopathy. Lungs/Pleura: Severe emphysema. Stable triangular peripheral density in the superior segment right lower lobe compatible with previously treated nodule. Airway thickening is present, suggesting bronchitis or reactive airways disease. Mild peripheral airway plugging. There are a few scattered nodules measuring up to 0.4 cm in the lungs which appear stable and probably incidental, including the small subpleural nodule in the right upper lobe on image 31/3. On image 39 of series 6 there is a 0.4 cm right lower lobe nodule not well seen on the prior exam, this merits surveillance. Upper Abdomen: Cholecystectomy. Aortoiliac atherosclerotic vascular disease. Bilateral adrenal adenomas. 7 mm left kidney upper pole hypodense lesion is likely a cyst but technically too small to characterize. Musculoskeletal: Lower cervical plate and screw fixator. IMPRESSION: 1. No findings of recurrent malignancy. 2. Stable triangular peripheral density in the  superior segment right lower lobe compatible with previously treated nodule. 3. There is a 0.4 cm right lower lobe nodule not well seen on the prior exam, this merits surveillance. A few other tiny nodules are stable. 4. Coronary, aortic arch, and branch vessel atherosclerotic vascular disease. 5. Bilateral adrenal adenomas. 6. Airway thickening is present, suggesting bronchitis or reactive airways disease. Mild peripheral airway plugging. 7. Emphysema and aortic atherosclerosis. Aortic Atherosclerosis (ICD10-I70.0) and Emphysema (ICD10-J43.9). Electronically Signed   By: WVan ClinesM.D.   On: 12/25/2019 13:04    ASSESSMENT: Clinical stage IIIa small cell lung carcinoma of the right upper lobe lung.  PLAN:    1. Clinical stage IIIa small cell lung carcinoma of the right upper lobe lung: Patient completed cycle 3 of cisplatin and etoposide on October 24-26, 2017. Given patient's extensive hospitalization after cycle 3, treatment was discontinued.  Previously, MRI of the brain was negative and patient has completed PCI.  Patient's most recent CT scan results from December 25, 2019 reviewed independently and reported as above with no obvious evidence of recurrent or progressive disease.  Given patient's declining performance status and multiple comorbidities, if she had recurrent disease treatment likely would not be possible or recommended.  She is now 4 years removed from completing her treatment and no further imaging is necessary unless there is suspicion of recurrence.  No further follow-up has been scheduled.  Patient has been instructed to continue routine follow-up with her primary care.  Please refer patient back if there are any questions or concerns.   2.  Peripheral neuropathy: Chronic and unchanged.   3.  Anemia: Resolved. 4.  Renal insufficiency: Chronic and unchanged. 5.  Shortness of breath: Continue follow-up with pulmonary as needed. 6.  Right lower lobe pulmonary nodule: 4 mm.   No further imaging is recommended given patient's comorbidities and declining performance status.  Patient expressed understanding and was in agreement with this plan. She also understands that She can call  clinic at any time with any questions, concerns, or complaints.   Cancer Staging Primary cancer of right upper lobe of lung [facility]) Staging form: Lung, AJCC 7th Edition - Clinical stage from 11/26/2015: Stage IIIA (T1a, N2, M0) - Signed by Lloyd Huger, MD on 11/26/2015   Lloyd Huger, MD   01/04/2020 4:33 PM

## 2020-01-02 ENCOUNTER — Ambulatory Visit: Payer: Medicare Other | Admitting: Internal Medicine

## 2020-01-02 ENCOUNTER — Other Ambulatory Visit: Payer: Self-pay | Admitting: Family Medicine

## 2020-01-02 DIAGNOSIS — I6523 Occlusion and stenosis of bilateral carotid arteries: Secondary | ICD-10-CM

## 2020-01-02 NOTE — Telephone Encounter (Signed)
Requested Prescriptions  Pending Prescriptions Disp Refills   nitroGLYCERIN (NITROSTAT) 0.4 MG SL tablet [Pharmacy Med Name: NITROGLYCERIN 0.4 MG TABLET SL] 75 tablet 0    Sig: PLACE 1 TABLET UNDER THE TONGUE EVERY 5 MINUTES AS NEEDED FOR CHEST PAIN     Cardiovascular:  Nitrates Passed - 01/02/2020  1:26 AM      Passed - Last BP in normal range    BP Readings from Last 1 Encounters:  11/14/19 119/64         Passed - Last Heart Rate in normal range    Pulse Readings from Last 1 Encounters:  11/14/19 60         Passed - Valid encounter within last 12 months    Recent Outpatient Visits          1 month ago Eczema, unspecified type   [facility] Jerrol Banana., MD   3 months ago Failure to thrive in adult   [facility] Rosanna Randy, Retia Passe., MD   3 months ago MCI (mild cognitive impairment)   [facility] Jerrol Banana., MD   6 months ago Skin ulcer of sacrum, limited to breakdown of skin [facility])   [place], [place], [place]   6 months ago Essential hypertension, benign   [facility] Jerrol Banana., MD      Future Appointments            In 1 week Jerrol Banana., MD [facility], [place]   In 1 month [name], MD [facility] [place]   In 2 months [place], Kathlene November, MD [facility], [place]

## 2020-01-03 ENCOUNTER — Encounter: Payer: Self-pay | Admitting: Oncology

## 2020-01-03 NOTE — Progress Notes (Signed)
Spoke to patient's husband during pre assessment. He states patient is not been feeling well. Having low oxygen levels during the day. Reports 02 sat at 13s. States patient has to wear oxygen continuously to keep oxygen level up. Also states patient has been feeling nauseous and would like a refill of medication to help. He reports no other questions or concerns at this time.

## 2020-01-04 ENCOUNTER — Inpatient Hospital Stay (HOSPITAL_BASED_OUTPATIENT_CLINIC_OR_DEPARTMENT_OTHER): Payer: Medicare Other | Admitting: Oncology

## 2020-01-04 DIAGNOSIS — C3411 Malignant neoplasm of upper lobe, right bronchus or lung: Secondary | ICD-10-CM | POA: Diagnosis not present

## 2020-01-11 ENCOUNTER — Ambulatory Visit: Payer: Self-pay | Admitting: Family Medicine

## 2020-01-16 DIAGNOSIS — H353221 Exudative age-related macular degeneration, left eye, with active choroidal neovascularization: Secondary | ICD-10-CM | POA: Diagnosis not present

## 2020-01-22 ENCOUNTER — Other Ambulatory Visit: Payer: Self-pay | Admitting: Family Medicine

## 2020-01-22 DIAGNOSIS — G3184 Mild cognitive impairment, so stated: Secondary | ICD-10-CM

## 2020-01-22 DIAGNOSIS — I10 Essential (primary) hypertension: Secondary | ICD-10-CM

## 2020-01-24 ENCOUNTER — Telehealth (INDEPENDENT_AMBULATORY_CARE_PROVIDER_SITE_OTHER): Payer: Medicare Other | Admitting: Family Medicine

## 2020-01-24 ENCOUNTER — Ambulatory Visit: Payer: Self-pay | Admitting: Family Medicine

## 2020-01-24 ENCOUNTER — Other Ambulatory Visit: Payer: Self-pay | Admitting: Family Medicine

## 2020-01-24 VITALS — BP 131/55 | HR 70 | Temp 97.8°F

## 2020-01-24 DIAGNOSIS — I2510 Atherosclerotic heart disease of native coronary artery without angina pectoris: Secondary | ICD-10-CM

## 2020-01-24 DIAGNOSIS — J441 Chronic obstructive pulmonary disease with (acute) exacerbation: Secondary | ICD-10-CM | POA: Diagnosis not present

## 2020-01-24 DIAGNOSIS — I10 Essential (primary) hypertension: Secondary | ICD-10-CM | POA: Diagnosis not present

## 2020-01-24 DIAGNOSIS — C3411 Malignant neoplasm of upper lobe, right bronchus or lung: Secondary | ICD-10-CM

## 2020-01-24 DIAGNOSIS — R059 Cough, unspecified: Secondary | ICD-10-CM

## 2020-01-24 DIAGNOSIS — J449 Chronic obstructive pulmonary disease, unspecified: Secondary | ICD-10-CM | POA: Diagnosis not present

## 2020-01-24 DIAGNOSIS — G3184 Mild cognitive impairment, so stated: Secondary | ICD-10-CM

## 2020-01-24 DIAGNOSIS — R0602 Shortness of breath: Secondary | ICD-10-CM | POA: Diagnosis not present

## 2020-01-24 MED ORDER — DOXYCYCLINE HYCLATE 100 MG PO TABS: 100.0000 mg | ORAL_TABLET | Freq: Two times a day (BID) | ORAL | 0 refills | Status: DC

## 2020-01-24 NOTE — Progress Notes (Signed)
Virtual telephone visit    Virtual Visit via Telephone Note   This visit type was conducted due to national recommendations for restrictions regarding the COVID-19 Pandemic (e.g. social distancing) in an effort to limit this patient's exposure and mitigate transmission in our community. Due to her co-morbid illnesses, this patient is at least at moderate risk for complications without adequate follow up. This format is felt to be most appropriate for this patient at this time. The patient did not have access to video technology or had technical difficulties with video requiring transitioning to audio format only (telephone). Physical exam was limited to content and character of the telephone converstion.    Patient location: Home Provider location: Office  I discussed the limitations of evaluation and management by telemedicine and the availability of in person appointments. The patient expressed understanding and agreed to proceed.   Visit Date: 01/24/2020  Today's healthcare provider: [name], MD   No chief complaint on file.  Subjective    HPI  Patient with chronic shortness of breath on 24-hour oxygen via nasal cannula per pulmonary complains of a worsening cough for the past week.  No fevers no myalgias.  She has had both Covid vaccines.  No known Covid exposure.  She is not short of breath but her husband states that if she moves about her O2 sats you in the 80s but this is a chronic problem.  She has not taken any inhalers.  She told the pulmonologist she cannot afford them.  She does not wish any inhalers.       Medications: Outpatient Medications Prior to Visit  Medication Sig  . Accu-Chek FastClix Lancets MISC USE AS DIRECTED  . albuterol (VENTOLIN HFA) 108 (90 Base) MCG/ACT inhaler Inhale 2 puffs into the lungs every 4 (four) hours as needed for wheezing or shortness of breath.  Marland Kitchen amLODipine (NORVASC) 5 MG tablet TAKE 1 TABLET BY MOUTH EVERY DAY  . aspirin  EC 81 MG EC tablet Take 1 tablet (81 mg total) by mouth daily.  Marland Kitchen atorvastatin (LIPITOR) 40 MG tablet TAKE 1 TABLET BY MOUTH  DAILY AT 6 PM.  . Blood Glucose Monitoring Suppl (ACCU-CHEK GUIDE) w/Device KIT USE AS DIRECTED  . clopidogrel (PLAVIX) 75 MG tablet TAKE 1 TABLET BY MOUTH  DAILY  . fluticasone (FLONASE) 50 MCG/ACT nasal spray Place 2 sprays into both nostrils daily. (Patient taking differently: Place 2 sprays into both nostrils daily as needed for allergies. )  . gabapentin (NEURONTIN) 600 MG tablet TAKE ONE-HALF TABLET BY  MOUTH TWO TIMES DAILY (Patient taking differently: Take 300 mg by mouth 2 (two) times daily. )  . glimepiride (AMARYL) 2 MG tablet TAKE 1 TABLET BY MOUTH TWO  TIMES DAILY  . glucose blood (ACCU-CHEK GUIDE) test strip USE ONCE DAILY AS DIRECTED  . ipratropium-albuterol (DUONEB) 0.5-2.5 (3) MG/3ML SOLN Take 3 mLs by nebulization every 4 (four) hours as needed.  Marland Kitchen LORazepam (ATIVAN) 1 MG tablet Take 1 tablet (1 mg total) by mouth 2 (two) times daily as needed for anxiety.  . magnesium oxide (MAG-OX) 400 (241.3 MG) MG tablet Take 400 mg by mouth 2 (two) times daily.   . metFORMIN (GLUCOPHAGE) 500 MG tablet Take 1 tablet (500 mg total) by mouth 2 (two) times daily with a meal. (Patient taking differently: Take 500 mg by mouth daily with breakfast. Can only tolerate once daily with meal)  . metoprolol succinate (TOPROL-XL) 100 MG 24 hr tablet Take 1 tablet (100  mg total) by mouth daily.  . mometasone (ELOCON) 0.1 % lotion Apply 4 drops topically at bedtime as needed (dry skin).  . Multiple Vitamin (MULTIVITAMIN) tablet Take 0.5 tablets by mouth 2 (two) times daily.  . Multiple Vitamins-Minerals (ICAPS) CAPS Take 1 capsule by mouth 2 (two) times daily.   . mupirocin cream (BACTROBAN) 2 % Apply 1 application topically 2 (two) times daily.  . nitroGLYCERIN (NITROSTAT) 0.4 MG SL tablet PLACE 1 TABLET UNDER THE TONGUE EVERY 5 MINUTES AS NEEDED FOR CHEST PAIN  . pantoprazole  (PROTONIX) 40 MG tablet Take 1 tablet (40 mg total) by mouth 2 (two) times daily.  . sucralfate (CARAFATE) 1 g tablet Take 1 tablet (1 g total) by mouth 2 (two) times daily.  Marland Kitchen VITAMIN D PO Take by mouth daily.   . vitamin E (VITAMIN E) 400 UNIT capsule Take 400 Units by mouth daily.  . isosorbide mononitrate (IMDUR) 60 MG 24 hr tablet Take 1 tablet (60 mg total) by mouth daily.   Facility-Administered Medications Prior to Visit  Medication Dose Route Frequency Provider  . ondansetron (ZOFRAN) 8 mg, dexamethasone (DECADRON) 10 mg in sodium chloride 0.9 % 50 mL IVPB   Intravenous Once Lloyd Huger, MD    Review of Systems  Constitutional: Negative for appetite change, chills, fatigue and fever.  Respiratory: Negative for chest tightness and shortness of breath.   Cardiovascular: Negative for chest pain and palpitations.  Gastrointestinal: Negative for abdominal pain, nausea and vomiting.  Neurological: Negative for dizziness and weakness.       Objective    BP (!) 131/55   Pulse 70   Temp 97.8 F (36.6 C)   SpO2 97%  SpO2 Readings from Last 3 Encounters:  01/24/20 97%  09/12/19 95%  06/21/19 95%        Assessment & Plan     1. Chronic obstructive pulmonary disease with acute exacerbation [facility]) May need to add prednisone or inhalers in the future for progressive COPD.  In the past she has said she quit smoking with her lung cancer. - doxycycline (VIBRA-TABS) 100 MG tablet; Take 1 tablet (100 mg total) by mouth 2 (two) times daily.  Dispense: 20 tablet; Refill: 0  2. Primary cancer of right upper lobe of lung ([place]) Recent CT was clear  3. CAD in native artery All risk factors treated  4. HYPERTENSION, BENIGN   5. Cough Consider prednisone taper in the future.   No follow-ups on file.    I discussed the assessment and treatment plan with the patient. The patient was provided an opportunity to ask questions and all were answered. The patient agreed with the  plan and demonstrated an understanding of the instructions.   The patient was advised to call back or seek an in-person evaluation if the symptoms worsen or if the condition fails to improve as anticipated.  I provided 12 minutes of non-face-to-face time during this encounter.    Richard Cranford Mon, MD [facility] [phone] (phone) [phone] (fax)  [place]

## 2020-01-26 DIAGNOSIS — J449 Chronic obstructive pulmonary disease, unspecified: Secondary | ICD-10-CM | POA: Diagnosis not present

## 2020-01-29 ENCOUNTER — Telehealth: Payer: Self-pay

## 2020-01-29 NOTE — Telephone Encounter (Signed)
Copied from [place] [id number]. Topic: General - Inquiry >> Jan 29, 2020 12:26 PM Greggory Keen D wrote: Reason for CRM: pt's husband called saying the antibiotic that was given to her last week is causing  an upset stomach.  He wanst to know if she can get something ele.  [facility]  CB#  [phone]

## 2020-01-29 NOTE — Telephone Encounter (Signed)
Please advise 

## 2020-02-02 ENCOUNTER — Telehealth: Payer: Self-pay

## 2020-02-02 ENCOUNTER — Other Ambulatory Visit: Payer: Self-pay | Admitting: Family Medicine

## 2020-02-02 DIAGNOSIS — G3184 Mild cognitive impairment, so stated: Secondary | ICD-10-CM

## 2020-02-02 NOTE — Telephone Encounter (Signed)
Pt's husband called to report that the current antibiotic is making her sick. He would like an alternative option.

## 2020-02-02 NOTE — Telephone Encounter (Signed)
Copied from [place] [id number]. Topic: General - Other >> Feb 02, 2020  2:29 PM Leward Quan A wrote: Reason for CRM: Patient caretaker and friend Coralyn Mark called in with instructions from her husband [name] asking for clarification on the donepezil (ARICEPT) 5 MG tablet because per [place] reported that Dr Rosanna Randy refused the refill on the medication for future use. Patient been out of medication for a week now.  Please advise call Ph# [phone]

## 2020-02-05 NOTE — Telephone Encounter (Signed)
Please advise 

## 2020-02-09 ENCOUNTER — Ambulatory Visit: Payer: Medicare Other | Admitting: Internal Medicine

## 2020-02-14 MED ORDER — DONEPEZIL HCL 5 MG PO TABS: ORAL_TABLET | ORAL | 1 refills | Status: DC

## 2020-02-14 NOTE — Telephone Encounter (Signed)
5mg  daily

## 2020-02-14 NOTE — Telephone Encounter (Signed)
Rx sent to pharmacy   

## 2020-02-14 NOTE — Addendum Note (Signed)
Addended by: Julieta Bellini on: 02/14/2020 08:35 AM   Modules accepted: Orders

## 2020-02-24 DIAGNOSIS — J449 Chronic obstructive pulmonary disease, unspecified: Secondary | ICD-10-CM | POA: Diagnosis not present

## 2020-02-24 DIAGNOSIS — R0602 Shortness of breath: Secondary | ICD-10-CM | POA: Diagnosis not present

## 2020-02-26 DIAGNOSIS — J449 Chronic obstructive pulmonary disease, unspecified: Secondary | ICD-10-CM | POA: Diagnosis not present

## 2020-03-11 ENCOUNTER — Ambulatory Visit: Payer: Medicare Other | Admitting: Cardiovascular Disease

## 2020-03-14 ENCOUNTER — Ambulatory Visit (INDEPENDENT_AMBULATORY_CARE_PROVIDER_SITE_OTHER): Payer: Medicare Other | Admitting: Family Medicine

## 2020-03-14 ENCOUNTER — Encounter: Payer: Self-pay | Admitting: Family Medicine

## 2020-03-14 ENCOUNTER — Ambulatory Visit: Payer: Medicare Other | Admitting: Family Medicine

## 2020-03-14 ENCOUNTER — Other Ambulatory Visit: Payer: Self-pay

## 2020-03-14 VITALS — BP 130/61 | HR 57 | Temp 98.6°F | Resp 18

## 2020-03-14 DIAGNOSIS — C3411 Malignant neoplasm of upper lobe, right bronchus or lung: Secondary | ICD-10-CM

## 2020-03-14 DIAGNOSIS — I10 Essential (primary) hypertension: Secondary | ICD-10-CM | POA: Diagnosis not present

## 2020-03-14 DIAGNOSIS — Z23 Encounter for immunization: Secondary | ICD-10-CM

## 2020-03-14 DIAGNOSIS — N189 Chronic kidney disease, unspecified: Secondary | ICD-10-CM

## 2020-03-14 DIAGNOSIS — G3184 Mild cognitive impairment, so stated: Secondary | ICD-10-CM

## 2020-03-14 DIAGNOSIS — D649 Anemia, unspecified: Secondary | ICD-10-CM | POA: Diagnosis not present

## 2020-03-14 DIAGNOSIS — E1142 Type 2 diabetes mellitus with diabetic polyneuropathy: Secondary | ICD-10-CM | POA: Diagnosis not present

## 2020-03-14 MED ORDER — DONEPEZIL HCL 5 MG PO TABS: ORAL_TABLET | ORAL | 1 refills | Status: DC

## 2020-03-14 NOTE — Progress Notes (Signed)
I,[name],acting as a scribe for [name], MD.,have documented all relevant documentation on the behalf of [name], MD,as directed by  [name], MD while in the presence of [name], MD.   Established patient visit   Patient: [name] [name]   DOB: 05-09-1940   79 y.o. Female  MRN: [id number] Visit Date: 03/14/2020  Today's healthcare provider: [name], MD   Chief Complaint  Patient presents with  . Follow-up  . Diabetes  . Hypertension   Subjective    HPI  Patient has no specific complaints.  She is getting weaker with time.  She has had no recent falls. Diabetes Mellitus Type II, follow-up  Lab Results  Component Value Date   HGBA1C 6.3 (H) 06/14/2019   HGBA1C 5.9 (H) 01/10/2019   HGBA1C 8.7 (A) 09/05/2018   Last seen for diabetes 9 months ago.  Management since then includes continuing the same treatment. She reports good compliance with treatment. She is not having side effects. none  Home blood sugar records: fasting range: 110-150  Episodes of hypoglycemia? No none   Current insulin regiment: n/a Most Recent Eye Exam: 04/10/2019  --------------------------------------------------------------------  Hypertension, follow-up  BP Readings from Last 3 Encounters:  03/14/20 130/61  01/24/20 (!) 131/55  11/14/19 119/64   Wt Readings from Last 3 Encounters:  11/14/19 135 lb (61.2 kg)  09/05/19 110 lb (49.9 kg)  06/12/19 135 lb (61.2 kg)     She was last seen for hypertension 4 months ago.  BP at that visit was 119/64. Management since that visit includes; on Imdur. She reports good compliance with treatment. She is not having side effects. none She is not exercising. She is adherent to low salt diet.   Outside blood pressures are 140/72.  She does not smoke.  Use of agents associated with hypertension: none.    --------------------------------------------------------------------  Gastroesophageal reflux disease with esophagitis without hemorrhage From 11/13/2019-Double PPI dose.  Malnutrition of moderate degree From 11/13/2019-Encourage patient to eat anything that appeals to her.  Chronic obstructive pulmonary disease, unspecified COPD type ([place]) From 11/13/2019-Patient is quit smoking.  Primary cancer of right upper lobe of lung ([place]) From 11/13/2019-Followed by oncology.  Failure to thrive in adult From 11/13/2019-Palliative care following patient.       Medications: Outpatient Medications Prior to Visit  Medication Sig  . Accu-Chek FastClix Lancets MISC USE AS DIRECTED  . albuterol (VENTOLIN HFA) 108 (90 Base) MCG/ACT inhaler Inhale 2 puffs into the lungs every 4 (four) hours as needed for wheezing or shortness of breath.  Marland Kitchen amLODipine (NORVASC) 5 MG tablet TAKE 1 TABLET BY MOUTH EVERY DAY  . aspirin EC 81 MG EC tablet Take 1 tablet (81 mg total) by mouth daily.  Marland Kitchen atorvastatin (LIPITOR) 40 MG tablet TAKE 1 TABLET BY MOUTH  DAILY AT 6 PM.  . Blood Glucose Monitoring Suppl (ACCU-CHEK GUIDE) w/Device KIT USE AS DIRECTED  . clopidogrel (PLAVIX) 75 MG tablet TAKE 1 TABLET BY MOUTH  DAILY  . donepezil (ARICEPT) 5 MG tablet TAKE 1 TABLET BY MOUTH EVERYDAY AT BEDTIME  . doxycycline (VIBRA-TABS) 100 MG tablet Take 1 tablet (100 mg total) by mouth 2 (two) times daily.  . fluticasone (FLONASE) 50 MCG/ACT nasal spray Place 2 sprays into both nostrils daily. (Patient taking differently: Place 2 sprays into both nostrils daily as needed for allergies.)  . gabapentin (NEURONTIN) 600 MG tablet TAKE ONE-HALF TABLET BY  MOUTH TWO TIMES DAILY (Patient  taking differently: Take 300 mg by mouth 2 (two) times daily.)  . glimepiride (AMARYL) 2 MG tablet TAKE 1 TABLET BY MOUTH TWO  TIMES DAILY  . glucose blood (ACCU-CHEK GUIDE) test strip USE ONCE DAILY AS DIRECTED  . ipratropium-albuterol (DUONEB)  0.5-2.5 (3) MG/3ML SOLN Take 3 mLs by nebulization every 4 (four) hours as needed.  Marland Kitchen LORazepam (ATIVAN) 1 MG tablet Take 1 tablet (1 mg total) by mouth 2 (two) times daily as needed for anxiety.  . magnesium oxide (MAG-OX) 400 (241.3 MG) MG tablet Take 400 mg by mouth 2 (two) times daily.   . metFORMIN (GLUCOPHAGE) 500 MG tablet Take 1 tablet (500 mg total) by mouth 2 (two) times daily with a meal. (Patient taking differently: Take 500 mg by mouth daily with breakfast. Can only tolerate once daily with meal)  . metoprolol succinate (TOPROL-XL) 100 MG 24 hr tablet Take 1 tablet (100 mg total) by mouth daily.  . mometasone (ELOCON) 0.1 % lotion Apply 4 drops topically at bedtime as needed (dry skin).  . Multiple Vitamin (MULTIVITAMIN) tablet Take 0.5 tablets by mouth 2 (two) times daily.  . Multiple Vitamins-Minerals (ICAPS) CAPS Take 1 capsule by mouth 2 (two) times daily.   . mupirocin cream (BACTROBAN) 2 % Apply 1 application topically 2 (two) times daily.  . nitroGLYCERIN (NITROSTAT) 0.4 MG SL tablet PLACE 1 TABLET UNDER THE TONGUE EVERY 5 MINUTES AS NEEDED FOR CHEST PAIN  . pantoprazole (PROTONIX) 40 MG tablet Take 1 tablet (40 mg total) by mouth 2 (two) times daily.  . sucralfate (CARAFATE) 1 g tablet Take 1 tablet (1 g total) by mouth 2 (two) times daily.  Marland Kitchen VITAMIN D PO Take by mouth daily.   . vitamin E 180 MG (400 UNITS) capsule Take 400 Units by mouth daily.  . isosorbide mononitrate (IMDUR) 60 MG 24 hr tablet Take 1 tablet (60 mg total) by mouth daily.   Facility-Administered Medications Prior to Visit  Medication Dose Route Frequency Provider  . ondansetron (ZOFRAN) 8 mg, dexamethasone (DECADRON) 10 mg in sodium chloride 0.9 % 50 mL IVPB   Intravenous Once Lloyd Huger, MD    Review of Systems  Constitutional: Negative for appetite change, chills, fatigue and fever.  Respiratory: Negative for chest tightness and shortness of breath.   Cardiovascular: Negative for chest pain  and palpitations.  Gastrointestinal: Negative for abdominal pain, nausea and vomiting.  Neurological: Negative for dizziness and weakness.    Last hemoglobin A1c Lab Results  Component Value Date   HGBA1C 6.2 (H) 03/14/2020      Objective    BP 130/61 (BP Location: Right Arm, Patient Position: Sitting, Cuff Size: Normal)   Pulse (!) 57   Temp 98.6 F (37 C) (Oral)   Resp 18   SpO2 90%  BP Readings from Last 3 Encounters:  03/14/20 130/61  01/24/20 (!) 131/55  11/14/19 119/64   Wt Readings from Last 3 Encounters:  11/14/19 135 lb (61.2 kg)  09/05/19 110 lb (49.9 kg)  06/12/19 135 lb (61.2 kg)      Physical Exam Vitals and nursing note reviewed.  Constitutional:      Appearance: Normal appearance. She is normal weight.  Cardiovascular:     Rate and Rhythm: Normal rate and regular rhythm.     Pulses: Normal pulses.     Heart sounds: Normal heart sounds.  Pulmonary:     Effort: Pulmonary effort is normal.     Breath sounds: Normal breath sounds.  Abdominal:     General: Bowel sounds are normal.     Palpations: Abdomen is soft.  Musculoskeletal:        General: Normal range of motion.     Cervical back: Normal range of motion and neck supple.  Skin:    General: Skin is warm and dry.  Neurological:     Mental Status: She is alert and oriented to person, place, and time. Mental status is at baseline.  Psychiatric:        Mood and Affect: Mood normal.        Behavior: Behavior normal.       No results found for any visits on 03/14/20.  Assessment & Plan     1. Diabetic polyneuropathy associated with type 2 diabetes mellitus (HCC) Clinically stable.  Goal A1c less than 13 and 79 year old in failing health - CBC w/Diff/Platelet - Comprehensive Metabolic Panel (CMET) - Hemoglobin A1c  2. Essential hypertension, benign  - CBC w/Diff/Platelet - Comprehensive Metabolic Panel (CMET) - Hemoglobin A1c  3. Anemia, unspecified type Work-up or treat if he gets  below 8 hemoglobin - CBC w/Diff/Platelet - Comprehensive Metabolic Panel (CMET) - Hemoglobin A1c  4. Chronic kidney disease, unspecified CKD stage Stay hydrated and avoid NSAID - CBC w/Diff/Platelet - Comprehensive Metabolic Panel (CMET) - Hemoglobin A1c  5. Primary cancer of right upper lobe of lung (HCC)  - CBC w/Diff/Platelet - Comprehensive Metabolic Panel (CMET) - Hemoglobin A1c  6. Need for influenza vaccination  - Flu Vaccine QUAD High Dose(Fluad)  7. MCI (mild cognitive impairment) MMSE on next visit. - donepezil (ARICEPT) 5 MG tablet; TAKE 1 TABLET BY MOUTH EVERYDAY AT BEDTIME  Dispense: 90 tablet; Refill: 1   No follow-ups on file.         Jestina Stephani Cranford Mon, MD  [facility] [phone] (phone) [phone] (fax)  [place]

## 2020-03-15 LAB — CBC WITH DIFFERENTIAL/PLATELET
Basophils Absolute: 0 10*3/uL (ref 0.0–0.2)
Basos: 0 %
EOS (ABSOLUTE): 0.2 10*3/uL (ref 0.0–0.4)
Eos: 2 %
Hematocrit: 34.8 % (ref 34.0–46.6)
Hemoglobin: 11.6 g/dL (ref 11.1–15.9)
Immature Grans (Abs): 0 10*3/uL (ref 0.0–0.1)
Immature Granulocytes: 0 %
Lymphocytes Absolute: 0.7 10*3/uL (ref 0.7–3.1)
Lymphs: 9 %
MCH: 30.7 pg (ref 26.6–33.0)
MCHC: 33.3 g/dL (ref 31.5–35.7)
MCV: 92 fL (ref 79–97)
Monocytes Absolute: 0.5 10*3/uL (ref 0.1–0.9)
Monocytes: 6 %
Neutrophils Absolute: 6.4 10*3/uL (ref 1.4–7.0)
Neutrophils: 83 %
Platelets: 234 10*3/uL (ref 150–450)
RBC: 3.78 x10E6/uL (ref 3.77–5.28)
RDW: 14.4 % (ref 11.7–15.4)
WBC: 7.8 10*3/uL (ref 3.4–10.8)

## 2020-03-15 LAB — COMPREHENSIVE METABOLIC PANEL
ALT: 18 IU/L (ref 0–32)
AST: 11 IU/L (ref 0–40)
Albumin/Globulin Ratio: 1.3 (ref 1.2–2.2)
Albumin: 3.9 g/dL (ref 3.7–4.7)
Alkaline Phosphatase: 88 IU/L (ref 44–121)
BUN/Creatinine Ratio: 13 (ref 12–28)
BUN: 25 mg/dL (ref 8–27)
Bilirubin Total: 0.3 mg/dL (ref 0.0–1.2)
CO2: 35 mmol/L — ABNORMAL HIGH (ref 20–29)
Calcium: 9.5 mg/dL (ref 8.7–10.3)
Chloride: 96 mmol/L (ref 96–106)
Creatinine, Ser: 1.88 mg/dL — ABNORMAL HIGH (ref 0.57–1.00)
GFR calc Af Amer: 29 mL/min/{1.73_m2} — ABNORMAL LOW (ref >59)
GFR calc non Af Amer: 25 mL/min/{1.73_m2} — ABNORMAL LOW (ref >59)
Globulin, Total: 2.9 g/dL (ref 1.5–4.5)
Glucose: 167 mg/dL — ABNORMAL HIGH (ref 65–99)
Potassium: 4.9 mmol/L (ref 3.5–5.2)
Sodium: 143 mmol/L (ref 134–144)
Total Protein: 6.8 g/dL (ref 6.0–8.5)

## 2020-03-15 LAB — HEMOGLOBIN A1C
Est. average glucose Bld gHb Est-mCnc: 131 mg/dL
Hgb A1c MFr Bld: 6.2 % — ABNORMAL HIGH (ref 4.8–5.6)

## 2020-03-20 ENCOUNTER — Other Ambulatory Visit: Payer: Self-pay | Admitting: Family Medicine

## 2020-03-20 DIAGNOSIS — M79605 Pain in left leg: Secondary | ICD-10-CM

## 2020-03-20 DIAGNOSIS — E1342 Other specified diabetes mellitus with diabetic polyneuropathy: Secondary | ICD-10-CM

## 2020-03-20 DIAGNOSIS — M79604 Pain in right leg: Secondary | ICD-10-CM

## 2020-03-25 DIAGNOSIS — J449 Chronic obstructive pulmonary disease, unspecified: Secondary | ICD-10-CM | POA: Diagnosis not present

## 2020-03-25 DIAGNOSIS — R0602 Shortness of breath: Secondary | ICD-10-CM | POA: Diagnosis not present

## 2020-03-27 DIAGNOSIS — J449 Chronic obstructive pulmonary disease, unspecified: Secondary | ICD-10-CM | POA: Diagnosis not present

## 2020-04-03 ENCOUNTER — Ambulatory Visit: Payer: Self-pay

## 2020-04-03 ENCOUNTER — Telehealth: Payer: Self-pay

## 2020-04-03 ENCOUNTER — Emergency Department
Admission: EM | Admit: 2020-04-03 | Discharge: 2020-04-04 | Disposition: A | Discharge status: Home / Self Care | Payer: Medicare Other | Attending: Emergency Medicine | Admitting: Emergency Medicine

## 2020-04-03 ENCOUNTER — Encounter: Payer: Self-pay | Admitting: Emergency Medicine

## 2020-04-03 ENCOUNTER — Other Ambulatory Visit: Payer: Self-pay

## 2020-04-03 DIAGNOSIS — N390 Urinary tract infection, site not specified: Secondary | ICD-10-CM | POA: Insufficient documentation

## 2020-04-03 DIAGNOSIS — R410 Disorientation, unspecified: Secondary | ICD-10-CM | POA: Diagnosis not present

## 2020-04-03 DIAGNOSIS — R0689 Other abnormalities of breathing: Secondary | ICD-10-CM | POA: Diagnosis not present

## 2020-04-03 DIAGNOSIS — R630 Anorexia: Secondary | ICD-10-CM | POA: Diagnosis not present

## 2020-04-03 DIAGNOSIS — R531 Weakness: Secondary | ICD-10-CM | POA: Insufficient documentation

## 2020-04-03 DIAGNOSIS — Z743 Need for continuous supervision: Secondary | ICD-10-CM | POA: Diagnosis not present

## 2020-04-03 DIAGNOSIS — Z5321 Procedure and treatment not carried out due to patient leaving prior to being seen by health care provider: Secondary | ICD-10-CM | POA: Insufficient documentation

## 2020-04-03 DIAGNOSIS — R6889 Other general symptoms and signs: Secondary | ICD-10-CM | POA: Diagnosis not present

## 2020-04-03 DIAGNOSIS — R4182 Altered mental status, unspecified: Secondary | ICD-10-CM | POA: Insufficient documentation

## 2020-04-03 LAB — CBC
HCT: 34.7 % — ABNORMAL LOW (ref 36.0–46.0)
Hemoglobin: 10.7 g/dL — ABNORMAL LOW (ref 12.0–15.0)
MCH: 30.1 pg (ref 26.0–34.0)
MCHC: 30.8 g/dL (ref 30.0–36.0)
MCV: 97.7 fL (ref 80.0–100.0)
Platelets: 206 10*3/uL (ref 150–400)
RBC: 3.55 MIL/uL — ABNORMAL LOW (ref 3.87–5.11)
RDW: 15.5 % (ref 11.5–15.5)
WBC: 7.9 10*3/uL (ref 4.0–10.5)
nRBC: 0.3 % — ABNORMAL HIGH (ref 0.0–0.2)

## 2020-04-03 LAB — BASIC METABOLIC PANEL
Anion gap: 5 (ref 5–15)
BUN: 23 mg/dL (ref 8–23)
CO2: 40 mmol/L — ABNORMAL HIGH (ref 22–32)
Calcium: 9 mg/dL (ref 8.9–10.3)
Chloride: 95 mmol/L — ABNORMAL LOW (ref 98–111)
Creatinine, Ser: 1.47 mg/dL — ABNORMAL HIGH (ref 0.44–1.00)
GFR, Estimated: 36 mL/min — ABNORMAL LOW (ref >60)
Glucose, Bld: 171 mg/dL — ABNORMAL HIGH (ref 70–99)
Potassium: 5.7 mmol/L — ABNORMAL HIGH (ref 3.5–5.1)
Sodium: 140 mmol/L (ref 135–145)

## 2020-04-03 LAB — HEPATIC FUNCTION PANEL
ALT: 18 U/L (ref 0–44)
AST: 29 U/L (ref 15–41)
Albumin: 3.3 g/dL — ABNORMAL LOW (ref 3.5–5.0)
Alkaline Phosphatase: 64 U/L (ref 38–126)
Bilirubin, Direct: 0.4 mg/dL — ABNORMAL HIGH (ref 0.0–0.2)
Indirect Bilirubin: 0.6 mg/dL (ref 0.3–0.9)
Total Bilirubin: 1 mg/dL (ref 0.3–1.2)
Total Protein: 6.7 g/dL (ref 6.5–8.1)

## 2020-04-03 LAB — TSH: TSH: 3.081 u[IU]/mL (ref 0.350–4.500)

## 2020-04-03 NOTE — ED Triage Notes (Signed)
Pt in via EMS from home with c/o possible UTI. Pt with increased confusion and weakness and no appetite for 3 days. Pt has been using WC more than usual and this happened last time she had a UTI. 110/76. Pt on 3L of Oxygen, pt always on Oxygen

## 2020-04-03 NOTE — Telephone Encounter (Signed)
Copied from [place] [id number]. Topic: Quick Communication - See Telephone Encounter >> Apr 03, 2020 12:55 PM Blase Mess A wrote: CRM for notification. See Telephone encounter for: 04/03/20. Patient's husband is calling to state that he is going to call EMS - has concerns that the patient may have had a stroke. Husband stated that she did not look right.

## 2020-04-03 NOTE — Telephone Encounter (Signed)
Patient is at ED now.

## 2020-04-03 NOTE — ED Notes (Signed)
Pt daughter Morey Hummingbird, [phone].Marland KitchenMarland KitchenMarland KitchenMarland Kitchenplease call if needed.

## 2020-04-03 NOTE — ED Triage Notes (Addendum)
Pt comes into the ED via [facility] from home with c/o of weakness and AMS.  Pt has no appetite x 3 days, increased smell of UTI, and has h/o multiple UTI's.  Pt on 3L [place] of O2 at baseline.  Pt denies any N/V/D, CP, SHOB, or dizziness. Pt has even and unlabored respirations at this time.

## 2020-04-10 ENCOUNTER — Ambulatory Visit (INDEPENDENT_AMBULATORY_CARE_PROVIDER_SITE_OTHER): Payer: Medicare Other | Admitting: Family Medicine

## 2020-04-10 ENCOUNTER — Other Ambulatory Visit: Payer: Self-pay

## 2020-04-10 ENCOUNTER — Encounter: Payer: Self-pay | Admitting: Family Medicine

## 2020-04-10 VITALS — BP 110/60 | HR 57 | Temp 98.0°F | Resp 16

## 2020-04-10 DIAGNOSIS — E875 Hyperkalemia: Secondary | ICD-10-CM | POA: Diagnosis not present

## 2020-04-10 DIAGNOSIS — I2510 Atherosclerotic heart disease of native coronary artery without angina pectoris: Secondary | ICD-10-CM

## 2020-04-10 DIAGNOSIS — E538 Deficiency of other specified B group vitamins: Secondary | ICD-10-CM

## 2020-04-10 DIAGNOSIS — G3184 Mild cognitive impairment, so stated: Secondary | ICD-10-CM | POA: Diagnosis not present

## 2020-04-10 DIAGNOSIS — I10 Essential (primary) hypertension: Secondary | ICD-10-CM

## 2020-04-10 DIAGNOSIS — N189 Chronic kidney disease, unspecified: Secondary | ICD-10-CM

## 2020-04-10 DIAGNOSIS — Z23 Encounter for immunization: Secondary | ICD-10-CM

## 2020-04-10 DIAGNOSIS — J441 Chronic obstructive pulmonary disease with (acute) exacerbation: Secondary | ICD-10-CM

## 2020-04-10 DIAGNOSIS — R5383 Other fatigue: Secondary | ICD-10-CM | POA: Diagnosis not present

## 2020-04-10 DIAGNOSIS — D649 Anemia, unspecified: Secondary | ICD-10-CM | POA: Diagnosis not present

## 2020-04-10 DIAGNOSIS — E1142 Type 2 diabetes mellitus with diabetic polyneuropathy: Secondary | ICD-10-CM | POA: Diagnosis not present

## 2020-04-10 NOTE — Progress Notes (Signed)
Established patient visit   Patient: [name] [name]   DOB: 08-14-1940   80 y.o. Female  MRN: [id number] Visit Date: 04/10/2020  Today's healthcare provider: [name], MD   Chief Complaint  Patient presents with  . Memory Loss   Subjective    HPI  Patient here today with her husband reporting various ongoing and worsening symptoms. Husband is requesting possibly getting some type nurse aide for routine help.  Husband is basically caring for the patient.  He brings her in in a wheelchair today.  States she is sleeping a lot.  She is having urinary incontinence and does not void but once a day.  She is only eating breakfast. Might be having some visual hallucinations.  She definitely has some times where she is confused as to where she is.  She states that she is feeling okay.  She admits her memory is not very good.  He admits to being weaker than she has been in the past.  She is on oxygen 24/7.  Patient Active Problem List   Diagnosis Date Noted  . Neutropenia ([place]) 09/09/2019  . Drug-induced polyneuropathy ([place]) 09/09/2019  . Inflammatory spondylopathy of lumbosacral region ([place]) 09/09/2019  . COPD (chronic obstructive pulmonary disease) ([place]) 06/06/2019  . GI bleed 12/28/2018  . Syncope 12/27/2018  . Blood per rectum 12/27/2018  . Constipation 12/27/2018  . Chest pain 09/23/2018  . Stroke (cerebrum) ([place]) 05/06/2018  . Malnutrition of moderate degree 03/31/2017  . Acute respiratory failure with hypoxia ([place]) 03/30/2017  . AKI (acute kidney injury) ([place]) 07/15/2016  . Protein-calorie malnutrition, severe 02/08/2016  . Primary cancer of right upper lobe of lung ([place]) 11/20/2015  . Abnormal CT lung screening 10/17/2015  . Personal history of tobacco use, presenting hazards to health 10/15/2015  . Chronic vulvitis 09/26/2014  . Allergic reaction 09/26/2014  . Carotid stenosis 08/30/2014  . Cervical nerve root disorder 08/10/2014  . CAD in native artery  08/10/2014  . B12 deficiency 08/10/2014  . Back ache 08/10/2014  . Bronchitis, chronic ([place]) 08/10/2014  . Diabetes mellitus with polyneuropathy ([place]) 08/10/2014  . Can't get food down 08/10/2014  . Eczema of external ear 08/10/2014  . Accumulation of fluid in tissues 08/10/2014  . Gout 08/10/2014  . Adult hypothyroidism 08/10/2014  . Mononeuritis 08/10/2014  . Muscle ache 08/10/2014  . Disorder of peripheral nervous system 08/10/2014  . Lesion of vulva 08/10/2014  . Cervical spondylosis with radiculopathy 10/19/2013  . COPD exacerbation ([place]) 03/29/2013  . CAD (coronary artery disease) 06/22/2011  . COPD (chronic obstructive pulmonary disease) with emphysema ([place]) 03/07/2010  . CHEST PAIN UNSPECIFIED 07/22/2009  . HLD (hyperlipidemia) 04/01/2009  . Malaise and fatigue 04/01/2009  . TOBACCO ABUSE 01/18/2009  . HYPERTENSION, BENIGN 01/18/2009  . CLAUDICATION 01/18/2009  . Pain in limb 01/18/2009  . CAFL (chronic airflow limitation) ([place]) 01/20/2007  . Late effects of cerebrovascular disease 01/10/2007  . Essential (primary) hypertension 12/22/2006   Social History   Tobacco Use  . Smoking status: Former Smoker    Packs/day: 1.00    Years: 50.00    Pack years: 50.00    Types: Cigarettes    Quit date: 10/03/2015    Years since quitting: 4.5  . Smokeless tobacco: Never Used  . Tobacco comment: smokes 3 cigs daily 05/06/15. Pt instructed to quit.  Vaping Use  . Vaping Use: Never used  Substance Use Topics  . Alcohol use: No    Alcohol/week: 0.0 standard  drinks  . Drug use: No   Allergies  Allergen Reactions  . Coconut Fatty Acids Swelling and Other (See Comments)    Throat swells       Medications: Outpatient Medications Prior to Visit  Medication Sig  . Accu-Chek FastClix Lancets MISC USE AS DIRECTED  . albuterol (VENTOLIN HFA) 108 (90 Base) MCG/ACT inhaler Inhale 2 puffs into the lungs every 4 (four) hours as needed for wheezing or shortness of breath.  Marland Kitchen  amLODipine (NORVASC) 5 MG tablet TAKE 1 TABLET BY MOUTH EVERY DAY  . aspirin EC 81 MG EC tablet Take 1 tablet (81 mg total) by mouth daily.  Marland Kitchen atorvastatin (LIPITOR) 40 MG tablet TAKE 1 TABLET BY MOUTH  DAILY AT 6 PM.  . Blood Glucose Monitoring Suppl (ACCU-CHEK GUIDE) w/Device KIT USE AS DIRECTED  . clopidogrel (PLAVIX) 75 MG tablet TAKE 1 TABLET BY MOUTH  DAILY  . donepezil (ARICEPT) 5 MG tablet TAKE 1 TABLET BY MOUTH EVERYDAY AT BEDTIME  . gabapentin (NEURONTIN) 600 MG tablet TAKE ONE-HALF TABLET BY  MOUTH TWICE DAILY  . glimepiride (AMARYL) 2 MG tablet TAKE 1 TABLET BY MOUTH  TWICE DAILY  . glucose blood (ACCU-CHEK GUIDE) test strip USE ONCE DAILY AS DIRECTED  . ipratropium-albuterol (DUONEB) 0.5-2.5 (3) MG/3ML SOLN Take 3 mLs by nebulization every 4 (four) hours as needed.  . isosorbide mononitrate (IMDUR) 60 MG 24 hr tablet Take 1 tablet (60 mg total) by mouth daily.  Marland Kitchen LORazepam (ATIVAN) 1 MG tablet Take 1 tablet (1 mg total) by mouth 2 (two) times daily as needed for anxiety.  . magnesium oxide (MAG-OX) 400 (241.3 MG) MG tablet Take 400 mg by mouth 2 (two) times daily.   . metoprolol succinate (TOPROL-XL) 100 MG 24 hr tablet Take 1 tablet (100 mg total) by mouth daily.  . mometasone (ELOCON) 0.1 % lotion Apply 4 drops topically at bedtime as needed (dry skin).  . Multiple Vitamin (MULTIVITAMIN) tablet Take 0.5 tablets by mouth 2 (two) times daily.  . Multiple Vitamins-Minerals (ICAPS) CAPS Take 1 capsule by mouth 2 (two) times daily.   . mupirocin cream (BACTROBAN) 2 % Apply 1 application topically 2 (two) times daily.  . nitroGLYCERIN (NITROSTAT) 0.4 MG SL tablet PLACE 1 TABLET UNDER THE TONGUE EVERY 5 MINUTES AS NEEDED FOR CHEST PAIN  . pantoprazole (PROTONIX) 40 MG tablet Take 1 tablet (40 mg total) by mouth 2 (two) times daily.  . sucralfate (CARAFATE) 1 g tablet Take 1 tablet (1 g total) by mouth 2 (two) times daily.  Marland Kitchen VITAMIN D PO Take by mouth daily.   . vitamin E 180 MG (400  UNITS) capsule Take 400 Units by mouth daily.  . fluticasone (FLONASE) 50 MCG/ACT nasal spray Place 2 sprays into both nostrils daily. (Patient not taking: Reported on 04/10/2020)  . metFORMIN (GLUCOPHAGE) 500 MG tablet Take 1 tablet (500 mg total) by mouth 2 (two) times daily with a meal. (Patient not taking: Reported on 04/10/2020)  . [DISCONTINUED] doxycycline (VIBRA-TABS) 100 MG tablet Take 1 tablet (100 mg total) by mouth 2 (two) times daily. (Patient not taking: Reported on 04/10/2020)   Facility-Administered Medications Prior to Visit  Medication Dose Route Frequency Provider  . ondansetron (ZOFRAN) 8 mg, dexamethasone (DECADRON) 10 mg in sodium chloride 0.9 % 50 mL IVPB   Intravenous Once Lloyd Huger, MD    Review of Systems  Constitutional: Positive for activity change, appetite change, fatigue and unexpected weight change.  Respiratory: Positive for cough,  shortness of breath and wheezing.   Genitourinary: Positive for enuresis and frequency.  Skin: Positive for color change.  Hematological: Bruises/bleeds easily.  Psychiatric/Behavioral: Positive for confusion, decreased concentration and sleep disturbance.    Last CBC Lab Results  Component Value Date   WBC 7.9 04/03/2020   HGB 10.7 (L) 04/03/2020   HCT 34.7 (L) 04/03/2020   MCV 97.7 04/03/2020   MCH 30.1 04/03/2020   RDW 15.5 04/03/2020   PLT 206 80/16/5537   Last metabolic panel Lab Results  Component Value Date   GLUCOSE 171 (H) 04/03/2020   NA 140 04/03/2020   K 5.7 (H) 04/03/2020   CL 95 (L) 04/03/2020   CO2 40 (H) 04/03/2020   BUN 23 04/03/2020   CREATININE 1.47 (H) 04/03/2020   GFRNONAA 36 (L) 04/03/2020   GFRAA 29 (L) 03/14/2020   CALCIUM 9.0 04/03/2020   PHOS 3.9 06/14/2019   PROT 6.7 04/03/2020   ALBUMIN 3.3 (L) 04/03/2020   LABGLOB 2.9 03/14/2020   AGRATIO 1.3 03/14/2020   BILITOT 1.0 04/03/2020   ALKPHOS 64 04/03/2020   AST 29 04/03/2020   ALT 18 04/03/2020   ANIONGAP 5 04/03/2020   Last  lipids Lab Results  Component Value Date   CHOL 149 06/07/2019   HDL 57 06/07/2019   LDLCALC 84 06/07/2019   TRIG 41 06/07/2019   CHOLHDL 2.6 06/07/2019   Last thyroid functions Lab Results  Component Value Date   TSH 3.081 04/03/2020      Objective    BP 110/60 (BP Location: Right Arm, Patient Position: Sitting, Cuff Size: Normal)   Pulse (!) 57   Temp 98 F (36.7 C) (Oral)   Resp 16   SpO2 96% Comment: 2 L O2 BP Readings from Last 3 Encounters:  04/10/20 110/60  04/03/20 (!) 114/98  03/14/20 130/61   Wt Readings from Last 3 Encounters:  04/03/20 130 lb (59 kg)  11/14/19 135 lb (61.2 kg)  09/05/19 110 lb (49.9 kg)      Physical Exam Vitals and nursing note reviewed.  Constitutional:      General: She is not in acute distress.    Comments: She is a cachectic white female in no acute distress.  Cardiovascular:     Rate and Rhythm: Normal rate and regular rhythm.     Pulses: Normal pulses.     Heart sounds: Normal heart sounds.  Pulmonary:     Effort: Pulmonary effort is normal.     Breath sounds: Normal breath sounds.  Abdominal:     General: Bowel sounds are normal.     Palpations: Abdomen is soft.  Musculoskeletal:     Cervical back: Normal range of motion and neck supple.     Right lower leg: No edema.     Left lower leg: No edema.  Skin:    General: Skin is warm and dry.  Neurological:     General: No focal deficit present.     Mental Status: She is alert and oriented to person, place, and time. Mental status is at baseline.  Psychiatric:        Mood and Affect: Mood normal.        Behavior: Behavior normal.       No results found for any visits on 04/10/20.  Assessment & Plan     1. Encounter for immunization Booster for COVID today. - Pfizer SARS-COV-2 Vaccine  2. Diabetic polyneuropathy associated with type 2 diabetes mellitus ([place]) Follow-up A1c when appropriate and routine  sugars. - TSH  3. Essential hypertension,  benign Controlled - Comprehensive metabolic panel  4. Anemia, unspecified type Chronic disease.  Also secondary to chronic kidney disease. - CBC with Differential/Platelet - Folate  5. Chronic kidney disease, unspecified CKD stage Avoid nonsteroidal  6. MCI (mild cognitive impairment) [place] next visit.  I think her incontinence is due to failure to thrive and her MCI.  No sign of urinary tract infection. - TSH  7. Chronic obstructive pulmonary disease with acute exacerbation (HCC) Patient smoked until about 4 years ago.  COPD and her lung cancer he is hanging in there pretty well.  8. CAD in native artery Risk factors treated  9. Fatigue, unspecified type Multifactorial.  Try to could get home health evaluation.  He is in failing health and palliative care may be appropriate in the near future. - Folate  10. B12 deficiency Follow-up B12 deficiency. - Vitamin B12 - Folate  11. Hyperkalemia Follow-up potassium. - Comprehensive metabolic panel   No follow-ups on file.      I, [name], MD, have reviewed all documentation for this visit. The documentation on 04/12/20 for the exam, diagnosis, procedures, and orders are all accurate and complete.    Jadalee Westcott Cranford Mon, MD  [facility] [phone] (phone) [phone] (fax)  [place]

## 2020-04-11 ENCOUNTER — Telehealth: Payer: Self-pay | Admitting: Cardiovascular Disease

## 2020-04-11 LAB — CBC WITH DIFFERENTIAL/PLATELET
Basophils Absolute: 0 10*3/uL (ref 0.0–0.2)
Basos: 0 %
EOS (ABSOLUTE): 0.2 10*3/uL (ref 0.0–0.4)
Eos: 3 %
Hematocrit: 34.7 % (ref 34.0–46.6)
Hemoglobin: 11.2 g/dL (ref 11.1–15.9)
Immature Grans (Abs): 0 10*3/uL (ref 0.0–0.1)
Immature Granulocytes: 0 %
Lymphocytes Absolute: 0.7 10*3/uL (ref 0.7–3.1)
Lymphs: 9 %
MCH: 30.3 pg (ref 26.6–33.0)
MCHC: 32.3 g/dL (ref 31.5–35.7)
MCV: 94 fL (ref 79–97)
Monocytes Absolute: 0.4 10*3/uL (ref 0.1–0.9)
Monocytes: 5 %
Neutrophils Absolute: 6.1 10*3/uL (ref 1.4–7.0)
Neutrophils: 83 %
Platelets: 223 10*3/uL (ref 150–450)
RBC: 3.7 x10E6/uL — ABNORMAL LOW (ref 3.77–5.28)
RDW: 14.6 % (ref 11.7–15.4)
WBC: 7.4 10*3/uL (ref 3.4–10.8)

## 2020-04-11 LAB — COMPREHENSIVE METABOLIC PANEL
ALT: 20 IU/L (ref 0–32)
AST: 17 IU/L (ref 0–40)
Albumin/Globulin Ratio: 1.6 (ref 1.2–2.2)
Albumin: 4.1 g/dL (ref 3.7–4.7)
Alkaline Phosphatase: 89 IU/L (ref 44–121)
BUN/Creatinine Ratio: 12 (ref 12–28)
BUN: 24 mg/dL (ref 8–27)
Bilirubin Total: 0.3 mg/dL (ref 0.0–1.2)
CO2: 35 mmol/L — ABNORMAL HIGH (ref 20–29)
Calcium: 9.2 mg/dL (ref 8.7–10.3)
Chloride: 95 mmol/L — ABNORMAL LOW (ref 96–106)
Creatinine, Ser: 1.93 mg/dL — ABNORMAL HIGH (ref 0.57–1.00)
GFR calc Af Amer: 28 mL/min/{1.73_m2} — ABNORMAL LOW (ref >59)
GFR calc non Af Amer: 24 mL/min/{1.73_m2} — ABNORMAL LOW (ref >59)
Globulin, Total: 2.5 g/dL (ref 1.5–4.5)
Glucose: 210 mg/dL — ABNORMAL HIGH (ref 65–99)
Potassium: 4.9 mmol/L (ref 3.5–5.2)
Sodium: 143 mmol/L (ref 134–144)
Total Protein: 6.6 g/dL (ref 6.0–8.5)

## 2020-04-11 LAB — VITAMIN B12: Vitamin B-12: 1130 pg/mL (ref 232–1245)

## 2020-04-11 LAB — TSH: TSH: 2.44 u[IU]/mL (ref 0.450–4.500)

## 2020-04-11 LAB — FOLATE: Folate: >20 ng/mL (ref >3.0)

## 2020-04-11 NOTE — Telephone Encounter (Signed)
  Patient Consent for Virtual Visit         [name] [name] has provided verbal consent on 04/11/2020 for a virtual visit (video or telephone).   CONSENT FOR VIRTUAL VISIT FOR:  [name] [name]  By participating in this virtual visit I agree to the following:  I hereby voluntarily request, consent and authorize [facility] and its employed or contracted physicians, Engineer, materials, nurse practitioners or other licensed health care professionals (the Practitioner), to provide me with telemedicine health care services (the "Services") as deemed necessary by the treating Practitioner. I acknowledge and consent to receive the Services by the Practitioner via telemedicine. I understand that the telemedicine visit will involve communicating with the Practitioner through live audiovisual communication technology and the disclosure of certain medical information by electronic transmission. I acknowledge that I have been given the opportunity to request an in-person assessment or other available alternative prior to the telemedicine visit and am voluntarily participating in the telemedicine visit.  I understand that I have the right to withhold or withdraw my consent to the use of telemedicine in the course of my care at any time, without affecting my right to future care or treatment, and that the Practitioner or I may terminate the telemedicine visit at any time. I understand that I have the right to inspect all information obtained and/or recorded in the course of the telemedicine visit and may receive copies of available information for a reasonable fee.  I understand that some of the potential risks of receiving the Services via telemedicine include:  Marland Kitchen Delay or interruption in medical evaluation due to technological equipment failure or disruption; . Information transmitted may not be sufficient (e.g. poor resolution of images) to allow for appropriate medical decision making by the Practitioner;  and/or  . In rare instances, security protocols could fail, causing a breach of personal health information.  Furthermore, I acknowledge that it is my responsibility to provide information about my medical history, conditions and care that is complete and accurate to the best of my ability. I acknowledge that Practitioner's advice, recommendations, and/or decision may be based on factors not within their control, such as incomplete or inaccurate data provided by me or distortions of diagnostic images or specimens that may result from electronic transmissions. I understand that the practice of medicine is not an exact science and that Practitioner makes no warranties or guarantees regarding treatment outcomes. I acknowledge that a copy of this consent can be made available to me via my patient portal ([facility]), or I can request a printed copy by calling the office of [facility].    I understand that my insurance will be billed for this visit.   I have read or had this consent read to me. . I understand the contents of this consent, which adequately explains the benefits and risks of the Services being provided via telemedicine.  . I have been provided ample opportunity to ask questions regarding this consent and the Services and have had my questions answered to my satisfaction. . I give my informed consent for the services to be provided through the use of telemedicine in my medical care

## 2020-04-15 ENCOUNTER — Telehealth: Payer: Self-pay

## 2020-04-15 NOTE — Telephone Encounter (Signed)
Copied from [facility] [id number]. Topic: General - Inquiry >> Apr 15, 2020 12:55 PM Reyne Dumas L wrote: Reason for CRM: Patients spouse calling to get lab results.  Nurse Triage at over a 2 minute hold at time of call.  Would someone be able to let patient know what lab results are.  Patients spouse states they do not use MyChart.

## 2020-04-15 NOTE — Telephone Encounter (Signed)
-----   Message from Jerrol Banana., MD sent at 04/15/2020  8:41 AM EST ----- Labs stable but kidney function down little bit.  Patient needs to drink more water during the day.

## 2020-04-15 NOTE — Telephone Encounter (Signed)
Pt's husband [name] (On DPR) advised.   Thanks,   -Mickel Baas

## 2020-04-16 ENCOUNTER — Other Ambulatory Visit: Payer: Self-pay

## 2020-04-16 ENCOUNTER — Telehealth (INDEPENDENT_AMBULATORY_CARE_PROVIDER_SITE_OTHER): Payer: Medicare Other | Admitting: Cardiovascular Disease

## 2020-04-16 ENCOUNTER — Encounter: Payer: Self-pay | Admitting: Cardiovascular Disease

## 2020-04-16 VITALS — BP 147/72 | HR 60 | Temp 98.0°F | Ht 65.0 in | Wt 120.0 lb

## 2020-04-16 DIAGNOSIS — R0789 Other chest pain: Secondary | ICD-10-CM

## 2020-04-16 DIAGNOSIS — I639 Cerebral infarction, unspecified: Secondary | ICD-10-CM

## 2020-04-16 DIAGNOSIS — J432 Centrilobular emphysema: Secondary | ICD-10-CM | POA: Diagnosis not present

## 2020-04-16 DIAGNOSIS — I6523 Occlusion and stenosis of bilateral carotid arteries: Secondary | ICD-10-CM | POA: Diagnosis not present

## 2020-04-16 DIAGNOSIS — I2510 Atherosclerotic heart disease of native coronary artery without angina pectoris: Secondary | ICD-10-CM | POA: Diagnosis not present

## 2020-04-16 NOTE — Progress Notes (Signed)
Virtual Visit via Telephone Note   This visit type was conducted due to national recommendations for restrictions regarding the COVID-19 Pandemic (e.g. social distancing) in an effort to limit this patient's exposure and mitigate transmission in our community.  Due to her co-morbid illnesses, this patient is at least at moderate risk for complications without adequate follow up.  This format is felt to be most appropriate for this patient at this time.  The patient did not have access to video technology/had technical difficulties with video requiring transitioning to audio format only (telephone).  All issues noted in this document were discussed and addressed.  No physical exam could be performed with this format.  Please refer to the patient's chart for her  consent to telehealth for [facility].   I connected with  [name] [name] on 04/16/20 by a video enabled telemedicine application and verified that I am speaking with the correct person using two identifiers. I am contacting the patient above from our cardiology clinic office or alternate office work station to their home, I discussed the limitations of evaluation and management by telemedicine. The patient expressed understanding and agreed to proceed.   Evaluation Performed:  Follow-up visit  Date:  04/16/2020   ID:  [name] [name], DOB May 05, 1940, MRN [id number]  Patient Location:  [place] [address]   Provider location:   [facility], [place] office  PCP:  [name] [name]., MD  Cardiologist:  Arvid Right [facility]  Chief Complaint  Patient presents with  . Follow-up    6 month F/U-no new cardiac concerns     History of Present Illness:    [name] [name] is a 80 y.o. female who presents via Engineer, civil (consulting) for a telehealth visit today.   The patient does not symptoms concerning for COVID-19 infection (fever, chills, cough, or new SHORTNESS OF BREATH).   Patient  has a past medical history of COPD with ongoing tobacco use,  stroke,  hypertension,  poorly control diabetes CVA   Hyperlipidemia H/o chest pain  minimal nonobstructive coronary artery disease by catheterization on May 08, 2004,   catheterization September 2012 showing 50% LAD disease, 60% RCA disease,  cardiac catheterization May 2015 -nonobstructive disease  in the hospital October 2016 with sugars of 600 who presents for follow-up of her shortness of breath, chest pain history  Weak, confused 12/21, doing better CR 1.93,  HBA1C 6.2  799-872 systolic  Hypoxia, on chronic oxygen Into 70% range off oxygen  Can't walk,  Barely get up, 1 year Family helps bath, tremendous effort Little bed sores,  Dementia,  Wears pull ups  Breakfast ok, not much rest of the day  Little bit of leg swelling  Lung CA, cancer treatment with complications Chemo x 3, XRT, on chest and brain  Quit smoking since June 2017  chronic shortness of breath on exertion  HBA1C 6.2  Carotid ultrasound reviewed from July 2016 with her showing 50-69% stenosis on the right, greater than 70% disease on the left. Since then she has had carotid endarterectomy on the left  Prior catheterization in 2015 showed no significant CAD. There is no evidence of previously seen moderate disease in the LAD or RCA. left arm symptoms were from musculoskeletal etiology, unable to exclude arthritis in her neck and DJD.      Prior CV studies:   The following studies were reviewed today:    Past Medical History:  Diagnosis  Date  . Abnormal CT lung screening 10/17/2015  . COPD (chronic obstructive pulmonary disease) ([place])   . Coronary artery disease, non-occlusive    a. cath 2006: min nonobs CAD; b. cath 12/2010: cath LAD 50%, RCA 60%; c. 08/2013: Minimal luminal irregs, right dominant system with no significant CAD, diffuse luminal irregs noted. Normal EF 55%, no AS or MS.   . Diabetes mellitus   .  Hyperlipemia    Followed by Dr. Rosanna Randy  . Hypertension   . Lung cancer ([place])   . Macular degeneration    rt  . Personal history of tobacco use, presenting hazards to health 10/15/2015  . Stroke [facility])    Past Surgical History:  Procedure Laterality Date  . ABDOMINAL HYSTERECTOMY    . ANTERIOR CERVICAL DECOMP/DISCECTOMY FUSION N/A 10/19/2013   Procedure: CERVICAL FIVE-SIX ANTERIOR CERVICAL DECOMPRESSION WITH FUSION INTERBODY PROSTHESIS PLATING AND PEEK CAGE;  Surgeon: [name] Charter, MD;  Location: [facility];  Service: Neurosurgery;  Laterality: N/A;  . BACK SURGERY  80's  . BREAST CYST EXCISION Left    left negative   . CARDIAC CATHETERIZATION  05/2004  . CATARACT EXTRACTION Left   . CHOLECYSTECTOMY    . COLONOSCOPY N/A 12/29/2018   Procedure: COLONOSCOPY;  Surgeon: Toledo, [name] Pike, MD;  Location: [facility] ENDOSCOPY;  Service: Gastroenterology;  Laterality: N/A;  . ENDARTERECTOMY Left 10/24/2014   Procedure: ENDARTERECTOMY CAROTID;  Surgeon: [name] Huxley, MD;  Location: [facility] ORS;  Service: Vascular;  Laterality: Left;  . ENDOBRONCHIAL ULTRASOUND N/A 11/14/2015   Procedure: ENDOBRONCHIAL ULTRASOUND;  Surgeon: [name] Lipps, MD;  Location: [facility] ORS;  Service: Cardiopulmonary;  Laterality: N/A;  . PERIPHERAL VASCULAR CATHETERIZATION N/A 12/04/2015   Procedure: [name] [name] Cath Insertion;  Surgeon: [name] Huxley, MD;  Location: [facility] CV LAB;  Service: Cardiovascular;  Laterality: N/A;  . PORTA CATH REMOVAL N/A 05/17/2017   Procedure: PORTA CATH REMOVAL;  Surgeon: [name] Huxley, MD;  Location: [facility] CV LAB;  Service: Cardiovascular;  Laterality: N/A;  . TONSILLECTOMY AND ADENOIDECTOMY    . VESICOVAGINAL FISTULA CLOSURE W/ TAH        Allergies:   Coconut fatty acids   Social History   Tobacco Use  . Smoking status: Former Smoker    Packs/day: 1.00    Years: 50.00    Pack years: 50.00    Types: Cigarettes    Quit date: 10/03/2015    Years since quitting: 4.5  . Smokeless  tobacco: Never Used  . Tobacco comment: smokes 3 cigs daily 05/06/15. Pt instructed to quit.  Vaping Use  . Vaping Use: Never used  Substance Use Topics  . Alcohol use: No    Alcohol/week: 0.0 standard drinks  . Drug use: No     Current Outpatient Medications on File Prior to Visit  Medication Sig Dispense Refill  . Accu-Chek FastClix Lancets MISC USE AS DIRECTED 102 each 1  . albuterol (VENTOLIN HFA) 108 (90 Base) MCG/ACT inhaler Inhale 2 puffs into the lungs every 4 (four) hours as needed for wheezing or shortness of breath. 8 g 11  . amLODipine (NORVASC) 5 MG tablet TAKE 1 TABLET BY MOUTH EVERY DAY 90 tablet 0  . aspirin EC 81 MG EC tablet Take 1 tablet (81 mg total) by mouth daily. 30 tablet 0  . atorvastatin (LIPITOR) 40 MG tablet TAKE 1 TABLET BY MOUTH  DAILY AT 6 PM. 90 tablet 3  . Blood Glucose Monitoring Suppl (ACCU-CHEK GUIDE) w/Device KIT USE AS DIRECTED  1 kit 0  . clopidogrel (PLAVIX) 75 MG tablet TAKE 1 TABLET BY MOUTH  DAILY 90 tablet 3  . donepezil (ARICEPT) 5 MG tablet TAKE 1 TABLET BY MOUTH EVERYDAY AT BEDTIME 90 tablet 1  . gabapentin (NEURONTIN) 600 MG tablet TAKE ONE-HALF TABLET BY  MOUTH TWICE DAILY 90 tablet 1  . glimepiride (AMARYL) 2 MG tablet TAKE 1 TABLET BY MOUTH  TWICE DAILY 180 tablet 1  . glucose blood (ACCU-CHEK GUIDE) test strip USE ONCE DAILY AS DIRECTED 50 strip 5  . ipratropium-albuterol (DUONEB) 0.5-2.5 (3) MG/3ML SOLN Take 3 mLs by nebulization every 4 (four) hours as needed. 360 mL 1  . isosorbide mononitrate (IMDUR) 60 MG 24 hr tablet Take 1 tablet (60 mg total) by mouth daily. 90 tablet 3  . LORazepam (ATIVAN) 1 MG tablet Take 1 tablet (1 mg total) by mouth 2 (two) times daily as needed for anxiety. 30 tablet 5  . magnesium oxide (MAG-OX) 400 (241.3 MG) MG tablet Take 400 mg by mouth 2 (two) times daily.     . metoprolol succinate (TOPROL-XL) 100 MG 24 hr tablet Take 1 tablet (100 mg total) by mouth daily. 90 tablet 3  . mometasone (ELOCON) 0.1 %  lotion Apply 4 drops topically at bedtime as needed (dry skin). 60 mL 0  . Multiple Vitamin (MULTIVITAMIN) tablet Take 0.5 tablets by mouth 2 (two) times daily.    . Multiple Vitamins-Minerals (ICAPS) CAPS Take 1 capsule by mouth 2 (two) times daily.     . nitroGLYCERIN (NITROSTAT) 0.4 MG SL tablet PLACE 1 TABLET UNDER THE TONGUE EVERY 5 MINUTES AS NEEDED FOR CHEST PAIN 75 tablet 0  . pantoprazole (PROTONIX) 40 MG tablet Take 1 tablet (40 mg total) by mouth 2 (two) times daily. 180 tablet 3  . VITAMIN D PO Take by mouth daily.     . vitamin E 180 MG (400 UNITS) capsule Take 400 Units by mouth daily.     Current Facility-Administered Medications on File Prior to Visit  Medication Dose Route Frequency Provider Last Rate Last Admin  . ondansetron (ZOFRAN) 8 mg, dexamethasone (DECADRON) 10 mg in sodium chloride 0.9 % 50 mL IVPB   Intravenous Once Lloyd Huger, MD         Family Hx: The patient's family history includes Heart attack in her brother, brother, father, and paternal grandmother; Lung cancer in her maternal grandfather; Stroke in her mother.  ROS:   Please see the history of present illness.    Review of Systems  Constitutional: Positive for malaise/fatigue.  HENT: Negative.   Respiratory: Positive for shortness of breath.   Cardiovascular: Positive for chest pain.  Gastrointestinal: Negative.        Anorexia  Musculoskeletal: Positive for joint pain.       Unable to stand, leg weakness  Neurological: Negative.   Psychiatric/Behavioral: Positive for depression and memory loss.  All other systems reviewed and are negative.    Labs/Other Tests and Data Reviewed:    Recent Labs: 06/06/2019: B Natriuretic Peptide 127.7 04/10/2020: ALT 20; BUN 24; Creatinine, Ser 1.93; Hemoglobin 11.2; Platelets 223; Potassium 4.9; Sodium 143; TSH 2.440   Recent Lipid Panel Lab Results  Component Value Date/Time   CHOL 149 06/07/2019 01:41 AM   CHOL 210 (H) 05/24/2017 02:11 PM   TRIG 41  06/07/2019 01:41 AM   HDL 57 06/07/2019 01:41 AM   HDL 52 05/24/2017 02:11 PM   CHOLHDL 2.6 06/07/2019 01:41 AM   LDLCALC 84  06/07/2019 01:41 AM   LDLCALC 88 05/24/2017 02:11 PM    Wt Readings from Last 3 Encounters:  04/16/20 120 lb (54.4 kg)  04/03/20 130 lb (59 kg)  11/14/19 135 lb (61.2 kg)     Exam:    Vital Signs: Vital signs may also be detailed in the HPI BP (!) 147/72 (BP Location: Right Arm, Patient Position: Sitting) Comment: before taking medications  Pulse 60   Temp 98 F (36.7 C)   Ht '5\' 5"'  (1.651 m)   Wt 120 lb (54.4 kg)   SpO2 96%   BMI 19.97 kg/m   Wt Readings from Last 3 Encounters:  04/16/20 120 lb (54.4 kg)  04/03/20 130 lb (59 kg)  11/14/19 135 lb (61.2 kg)   Temp Readings from Last 3 Encounters:  04/16/20 98 F (36.7 C)  04/10/20 98 F (36.7 C) (Oral)  04/03/20 98.5 F (36.9 C) (Oral)   BP Readings from Last 3 Encounters:  04/16/20 (!) 147/72  04/10/20 110/60  04/03/20 (!) 114/98   Pulse Readings from Last 3 Encounters:  04/16/20 60  04/10/20 (!) 57  04/03/20 66     Well nourished, well developed female in no acute distress. Constitutional:  oriented to person, place, and time. No distress.    ASSESSMENT & PLAN:    Problem List Items Addressed This Visit      Cardiology Problems   Stroke (cerebrum) ([place])   Carotid stenosis (Chronic)   CAD in native artery     Other   COPD (chronic obstructive pulmonary disease) (HCC) - Primary   Chest pain       Dementia Family helping, slowly getting worse  End-stage COPD Desaturations to 70% on room air, followed by pulmonary, long smoking history stopped in 2017 Underlying emphysema Exacerbated by lung cancer, previously treated with chemo XRT  Failure to thrive Unable to stand, family doing the bathing ,  Having difficulty maintaining as she is getting worse Patient not eating well  Mixed hyperlipidemia Lipitor up to 40 mg daily  Numbers ok  HYPERTENSION, BENIGN - Plan:  EKG 12-Lead Blood pressure is well controlled   No changes made to the medications.  stable angina ([place]) - Plan: EKG 12-Lead Stable anginal symptoms, takes nitroglycerin as needed Once in a while  TOBACCO ABUSE Stop smoking June 2017 On oxygen  Type 2 diabetes mellitus with other circulatory complication, unspecified long term insulin use status (HCC) Losing weight, HBA1C improving   COVID-19 Education: The signs and symptoms of COVID-19 were discussed with the patient and how to seek care for testing (follow up with PCP or arrange E-visit).  The importance of social distancing was discussed today.  Patient Risk:   After full review of this patients clinical status, I feel that they are at least moderate risk at this time.  Time:   Today, I have spent 25 minutes with the patient with telehealth technology discussing the cardiac and medical problems/diagnoses detailed above   Additional 10 min spent reviewing the chart prior to patient visit today   Medication Adjustments/Labs and Tests Ordered: Current medicines are reviewed at length with the patient today.  Concerns regarding medicines are outlined above.   Tests Ordered: No tests ordered   Medication Changes: No changes made   Total encounter time more than 25 minutes  Greater than 50% was spent in counseling and coordination of care with the patient   Signed, Ida Rogue, [place]  [address], [place], [place] [address]

## 2020-04-16 NOTE — Patient Instructions (Addendum)
Please talk to Dr. Rosanna Randy about placing pt in palliative care and maybe even transfer into hospice later on as her hearth deteriorates. Home health would be beneficial for pt as well, Dr. Rosanna Randy can assist with that as well.   Dr. Rockey Situ will try and reach out to Dr. Rosanna Randy about palliative care for Mrs. Janota.   Medication Instructions:  No changes, continue your current medications   Lab work: none  Testing/Procedures: none   Follow-Up:  . You will need a follow up appointment in 12 months   COVID-19 Vaccine Information can be found at: ShippingScam.co.uk For questions related to vaccine distribution or appointments, please email vaccine@[email or web address] or call [phone].

## 2020-04-18 ENCOUNTER — Other Ambulatory Visit: Payer: Self-pay | Admitting: Family Medicine

## 2020-04-18 DIAGNOSIS — E782 Mixed hyperlipidemia: Secondary | ICD-10-CM

## 2020-04-19 ENCOUNTER — Other Ambulatory Visit: Payer: Self-pay | Admitting: Family Medicine

## 2020-04-19 DIAGNOSIS — I10 Essential (primary) hypertension: Secondary | ICD-10-CM

## 2020-04-21 ENCOUNTER — Other Ambulatory Visit: Payer: Self-pay | Admitting: Family Medicine

## 2020-04-21 DIAGNOSIS — I10 Essential (primary) hypertension: Secondary | ICD-10-CM

## 2020-04-25 DIAGNOSIS — J449 Chronic obstructive pulmonary disease, unspecified: Secondary | ICD-10-CM | POA: Diagnosis not present

## 2020-04-25 DIAGNOSIS — R0602 Shortness of breath: Secondary | ICD-10-CM | POA: Diagnosis not present

## 2020-04-27 DIAGNOSIS — J449 Chronic obstructive pulmonary disease, unspecified: Secondary | ICD-10-CM | POA: Diagnosis not present

## 2020-04-29 ENCOUNTER — Telehealth: Payer: Self-pay

## 2020-04-29 NOTE — Telephone Encounter (Signed)
Please advise 

## 2020-04-29 NOTE — Telephone Encounter (Signed)
Copied from [place] [id number]. Topic: Quick Communication - See Telephone Encounter >> Apr 29, 2020  2:17 PM [name] wrote: CRM for notification. See Telephone encounter for: 04/29/20. Pt husband, [name] calling in to report to DR G that wife, pt has a couple sores on her bottom and he thinks needs some antibiotic cream for them. He also states that her ankles seem to be swelling a bit, If you could call in something not so expensive he would appreciate. Questions, call Clabe Seal [phone]

## 2020-05-08 NOTE — Telephone Encounter (Signed)
Patient is not being seen by Home Health. Mr. [name] [name] agrees to home health.

## 2020-05-08 NOTE — Telephone Encounter (Signed)
Is home health seeing patient.?  If not would like to order home health consult.  This might need to go through chronic care management as multiple services such as social work and home health might be needed.  Palliative care might be completely appropriate if not already done.

## 2020-05-09 ENCOUNTER — Other Ambulatory Visit: Payer: Self-pay

## 2020-05-09 DIAGNOSIS — R600 Localized edema: Secondary | ICD-10-CM

## 2020-05-09 DIAGNOSIS — L89309 Pressure ulcer of unspecified buttock, unspecified stage: Secondary | ICD-10-CM

## 2020-05-09 DIAGNOSIS — M4697 Unspecified inflammatory spondylopathy, lumbosacral region: Secondary | ICD-10-CM

## 2020-05-09 NOTE — Telephone Encounter (Signed)
Home health consult for evaluation.  Thank you.

## 2020-05-10 ENCOUNTER — Other Ambulatory Visit: Payer: Self-pay | Admitting: *Deleted

## 2020-05-10 NOTE — Progress Notes (Signed)
Order placed for home health per Dr. Rosanna Randy.

## 2020-05-13 NOTE — Telephone Encounter (Signed)
Home health order placed on 05/10/20.

## 2020-05-26 DIAGNOSIS — D709 Neutropenia, unspecified: Secondary | ICD-10-CM | POA: Diagnosis not present

## 2020-05-26 DIAGNOSIS — E538 Deficiency of other specified B group vitamins: Secondary | ICD-10-CM | POA: Diagnosis not present

## 2020-05-26 DIAGNOSIS — Z87891 Personal history of nicotine dependence: Secondary | ICD-10-CM | POA: Diagnosis not present

## 2020-05-26 DIAGNOSIS — Z9049 Acquired absence of other specified parts of digestive tract: Secondary | ICD-10-CM | POA: Diagnosis not present

## 2020-05-26 DIAGNOSIS — J449 Chronic obstructive pulmonary disease, unspecified: Secondary | ICD-10-CM | POA: Diagnosis not present

## 2020-05-26 DIAGNOSIS — Z8673 Personal history of transient ischemic attack (TIA), and cerebral infarction without residual deficits: Secondary | ICD-10-CM | POA: Diagnosis not present

## 2020-05-26 DIAGNOSIS — Z85118 Personal history of other malignant neoplasm of bronchus and lung: Secondary | ICD-10-CM | POA: Diagnosis not present

## 2020-05-26 DIAGNOSIS — M4722 Other spondylosis with radiculopathy, cervical region: Secondary | ICD-10-CM | POA: Diagnosis not present

## 2020-05-26 DIAGNOSIS — G64 Other disorders of peripheral nervous system: Secondary | ICD-10-CM | POA: Diagnosis not present

## 2020-05-26 DIAGNOSIS — Z7984 Long term (current) use of oral hypoglycemic drugs: Secondary | ICD-10-CM | POA: Diagnosis not present

## 2020-05-26 DIAGNOSIS — I2510 Atherosclerotic heart disease of native coronary artery without angina pectoris: Secondary | ICD-10-CM | POA: Diagnosis not present

## 2020-05-26 DIAGNOSIS — G542 Cervical root disorders, not elsewhere classified: Secondary | ICD-10-CM | POA: Diagnosis not present

## 2020-05-26 DIAGNOSIS — H3530 Unspecified macular degeneration: Secondary | ICD-10-CM | POA: Diagnosis not present

## 2020-05-26 DIAGNOSIS — E43 Unspecified severe protein-calorie malnutrition: Secondary | ICD-10-CM | POA: Diagnosis not present

## 2020-05-26 DIAGNOSIS — R0602 Shortness of breath: Secondary | ICD-10-CM | POA: Diagnosis not present

## 2020-05-26 DIAGNOSIS — E1159 Type 2 diabetes mellitus with other circulatory complications: Secondary | ICD-10-CM | POA: Diagnosis not present

## 2020-05-26 DIAGNOSIS — E782 Mixed hyperlipidemia: Secondary | ICD-10-CM | POA: Diagnosis not present

## 2020-05-26 DIAGNOSIS — M4697 Unspecified inflammatory spondylopathy, lumbosacral region: Secondary | ICD-10-CM | POA: Diagnosis not present

## 2020-05-26 DIAGNOSIS — I10 Essential (primary) hypertension: Secondary | ICD-10-CM | POA: Diagnosis not present

## 2020-05-26 DIAGNOSIS — K5900 Constipation, unspecified: Secondary | ICD-10-CM | POA: Diagnosis not present

## 2020-05-26 DIAGNOSIS — E1142 Type 2 diabetes mellitus with diabetic polyneuropathy: Secondary | ICD-10-CM | POA: Diagnosis not present

## 2020-05-26 DIAGNOSIS — Z7902 Long term (current) use of antithrombotics/antiplatelets: Secondary | ICD-10-CM | POA: Diagnosis not present

## 2020-05-26 DIAGNOSIS — J439 Emphysema, unspecified: Secondary | ICD-10-CM | POA: Diagnosis not present

## 2020-05-26 DIAGNOSIS — M109 Gout, unspecified: Secondary | ICD-10-CM | POA: Diagnosis not present

## 2020-05-26 DIAGNOSIS — I6523 Occlusion and stenosis of bilateral carotid arteries: Secondary | ICD-10-CM | POA: Diagnosis not present

## 2020-05-28 DIAGNOSIS — J449 Chronic obstructive pulmonary disease, unspecified: Secondary | ICD-10-CM | POA: Diagnosis not present

## 2020-06-03 DIAGNOSIS — M109 Gout, unspecified: Secondary | ICD-10-CM | POA: Diagnosis not present

## 2020-06-03 DIAGNOSIS — Z8673 Personal history of transient ischemic attack (TIA), and cerebral infarction without residual deficits: Secondary | ICD-10-CM | POA: Diagnosis not present

## 2020-06-03 DIAGNOSIS — E782 Mixed hyperlipidemia: Secondary | ICD-10-CM | POA: Diagnosis not present

## 2020-06-03 DIAGNOSIS — Z9049 Acquired absence of other specified parts of digestive tract: Secondary | ICD-10-CM | POA: Diagnosis not present

## 2020-06-03 DIAGNOSIS — I6523 Occlusion and stenosis of bilateral carotid arteries: Secondary | ICD-10-CM | POA: Diagnosis not present

## 2020-06-03 DIAGNOSIS — I10 Essential (primary) hypertension: Secondary | ICD-10-CM | POA: Diagnosis not present

## 2020-06-03 DIAGNOSIS — E1159 Type 2 diabetes mellitus with other circulatory complications: Secondary | ICD-10-CM | POA: Diagnosis not present

## 2020-06-03 DIAGNOSIS — J439 Emphysema, unspecified: Secondary | ICD-10-CM | POA: Diagnosis not present

## 2020-06-03 DIAGNOSIS — Z7902 Long term (current) use of antithrombotics/antiplatelets: Secondary | ICD-10-CM | POA: Diagnosis not present

## 2020-06-03 DIAGNOSIS — E43 Unspecified severe protein-calorie malnutrition: Secondary | ICD-10-CM | POA: Diagnosis not present

## 2020-06-03 DIAGNOSIS — G542 Cervical root disorders, not elsewhere classified: Secondary | ICD-10-CM | POA: Diagnosis not present

## 2020-06-03 DIAGNOSIS — M4722 Other spondylosis with radiculopathy, cervical region: Secondary | ICD-10-CM | POA: Diagnosis not present

## 2020-06-03 DIAGNOSIS — I2510 Atherosclerotic heart disease of native coronary artery without angina pectoris: Secondary | ICD-10-CM | POA: Diagnosis not present

## 2020-06-03 DIAGNOSIS — K5900 Constipation, unspecified: Secondary | ICD-10-CM | POA: Diagnosis not present

## 2020-06-03 DIAGNOSIS — E1142 Type 2 diabetes mellitus with diabetic polyneuropathy: Secondary | ICD-10-CM | POA: Diagnosis not present

## 2020-06-03 DIAGNOSIS — Z87891 Personal history of nicotine dependence: Secondary | ICD-10-CM | POA: Diagnosis not present

## 2020-06-03 DIAGNOSIS — H3530 Unspecified macular degeneration: Secondary | ICD-10-CM | POA: Diagnosis not present

## 2020-06-03 DIAGNOSIS — D709 Neutropenia, unspecified: Secondary | ICD-10-CM | POA: Diagnosis not present

## 2020-06-03 DIAGNOSIS — E538 Deficiency of other specified B group vitamins: Secondary | ICD-10-CM | POA: Diagnosis not present

## 2020-06-03 DIAGNOSIS — G64 Other disorders of peripheral nervous system: Secondary | ICD-10-CM | POA: Diagnosis not present

## 2020-06-03 DIAGNOSIS — M4697 Unspecified inflammatory spondylopathy, lumbosacral region: Secondary | ICD-10-CM | POA: Diagnosis not present

## 2020-06-03 DIAGNOSIS — Z85118 Personal history of other malignant neoplasm of bronchus and lung: Secondary | ICD-10-CM | POA: Diagnosis not present

## 2020-06-03 DIAGNOSIS — Z7984 Long term (current) use of oral hypoglycemic drugs: Secondary | ICD-10-CM | POA: Diagnosis not present

## 2020-06-06 ENCOUNTER — Other Ambulatory Visit (INDEPENDENT_AMBULATORY_CARE_PROVIDER_SITE_OTHER): Payer: Self-pay | Admitting: Vascular Surgery

## 2020-06-06 DIAGNOSIS — I6523 Occlusion and stenosis of bilateral carotid arteries: Secondary | ICD-10-CM

## 2020-06-10 DIAGNOSIS — H3530 Unspecified macular degeneration: Secondary | ICD-10-CM | POA: Diagnosis not present

## 2020-06-10 DIAGNOSIS — E1159 Type 2 diabetes mellitus with other circulatory complications: Secondary | ICD-10-CM | POA: Diagnosis not present

## 2020-06-10 DIAGNOSIS — I10 Essential (primary) hypertension: Secondary | ICD-10-CM | POA: Diagnosis not present

## 2020-06-10 DIAGNOSIS — E782 Mixed hyperlipidemia: Secondary | ICD-10-CM | POA: Diagnosis not present

## 2020-06-10 DIAGNOSIS — I2510 Atherosclerotic heart disease of native coronary artery without angina pectoris: Secondary | ICD-10-CM | POA: Diagnosis not present

## 2020-06-10 DIAGNOSIS — E538 Deficiency of other specified B group vitamins: Secondary | ICD-10-CM | POA: Diagnosis not present

## 2020-06-10 DIAGNOSIS — D709 Neutropenia, unspecified: Secondary | ICD-10-CM | POA: Diagnosis not present

## 2020-06-10 DIAGNOSIS — E1142 Type 2 diabetes mellitus with diabetic polyneuropathy: Secondary | ICD-10-CM | POA: Diagnosis not present

## 2020-06-10 DIAGNOSIS — Z87891 Personal history of nicotine dependence: Secondary | ICD-10-CM | POA: Diagnosis not present

## 2020-06-10 DIAGNOSIS — Z85118 Personal history of other malignant neoplasm of bronchus and lung: Secondary | ICD-10-CM | POA: Diagnosis not present

## 2020-06-10 DIAGNOSIS — I6523 Occlusion and stenosis of bilateral carotid arteries: Secondary | ICD-10-CM | POA: Diagnosis not present

## 2020-06-10 DIAGNOSIS — M4722 Other spondylosis with radiculopathy, cervical region: Secondary | ICD-10-CM | POA: Diagnosis not present

## 2020-06-10 DIAGNOSIS — G64 Other disorders of peripheral nervous system: Secondary | ICD-10-CM | POA: Diagnosis not present

## 2020-06-10 DIAGNOSIS — Z7902 Long term (current) use of antithrombotics/antiplatelets: Secondary | ICD-10-CM | POA: Diagnosis not present

## 2020-06-10 DIAGNOSIS — Z7984 Long term (current) use of oral hypoglycemic drugs: Secondary | ICD-10-CM | POA: Diagnosis not present

## 2020-06-10 DIAGNOSIS — K5900 Constipation, unspecified: Secondary | ICD-10-CM | POA: Diagnosis not present

## 2020-06-10 DIAGNOSIS — E43 Unspecified severe protein-calorie malnutrition: Secondary | ICD-10-CM | POA: Diagnosis not present

## 2020-06-10 DIAGNOSIS — Z8673 Personal history of transient ischemic attack (TIA), and cerebral infarction without residual deficits: Secondary | ICD-10-CM | POA: Diagnosis not present

## 2020-06-10 DIAGNOSIS — M109 Gout, unspecified: Secondary | ICD-10-CM | POA: Diagnosis not present

## 2020-06-10 DIAGNOSIS — Z9049 Acquired absence of other specified parts of digestive tract: Secondary | ICD-10-CM | POA: Diagnosis not present

## 2020-06-10 DIAGNOSIS — G542 Cervical root disorders, not elsewhere classified: Secondary | ICD-10-CM | POA: Diagnosis not present

## 2020-06-10 DIAGNOSIS — J439 Emphysema, unspecified: Secondary | ICD-10-CM | POA: Diagnosis not present

## 2020-06-10 DIAGNOSIS — M4697 Unspecified inflammatory spondylopathy, lumbosacral region: Secondary | ICD-10-CM | POA: Diagnosis not present

## 2020-06-12 NOTE — Progress Notes (Signed)
Subjective:   [name] [name] is a 80 y.o. female who presents for Medicare Annual (Subsequent) preventive examination.  I connected with Karalyn Sweigert and Juliann Pulse (stepdaughter) today by telephone and verified that I am speaking with the correct person using two identifiers. Location patient: home Location provider: work Persons participating in the virtual visit: patient, provider.   I discussed the limitations, risks, security and privacy concerns of performing an evaluation and management service by telephone and the availability of in person appointments. I also discussed with the patient that there may be a patient responsible charge related to this service. The patient expressed understanding and verbally consented to this telephonic visit.    Interactive audio and video telecommunications were attempted between this provider and patient, however failed, due to patient having technical difficulties OR patient did not have access to video capability.  We continued and completed visit with audio only.   Review of Systems    N/A  Cardiac Risk Factors include: advanced age (>58mn, >>83women);diabetes mellitus;dyslipidemia;hypertension     Objective:    There were no vitals filed for this visit. There is no height or weight on file to calculate BMI.  Advanced Directives 06/13/2020 04/03/2020 01/03/2020 06/08/2019 06/06/2019 12/28/2018 12/28/2018  Does Patient Have a Medical Advance Directive? No Yes No Yes Yes - No  Type of Advance Directive - Living will Living will [place] will - Living will  Does patient want to make changes to medical advance directive? - - - - - - -  Copy of HHaddamin Chart? - - No - copy requested No - copy requested - - No - copy requested  Would patient like information on creating a medical advance directive? No - Patient declined - - - - No - Patient declined -    Current Medications  (verified) Outpatient Encounter Medications as of 06/13/2020  Medication Sig   Accu-Chek FastClix Lancets MISC USE AS DIRECTED   albuterol (VENTOLIN HFA) 108 (90 Base) MCG/ACT inhaler Inhale 2 puffs into the lungs every 4 (four) hours as needed for wheezing or shortness of breath.   amLODipine (NORVASC) 5 MG tablet TAKE 1 TABLET BY MOUTH EVERY DAY   aspirin EC 81 MG EC tablet Take 1 tablet (81 mg total) by mouth daily.   atorvastatin (LIPITOR) 40 MG tablet TAKE 1 TABLET BY MOUTH  DAILY AT 6 PM   Blood Glucose Monitoring Suppl (ACCU-CHEK GUIDE) w/Device KIT USE AS DIRECTED   clopidogrel (PLAVIX) 75 MG tablet TAKE 1 TABLET BY MOUTH  DAILY   donepezil (ARICEPT) 5 MG tablet TAKE 1 TABLET BY MOUTH EVERYDAY AT BEDTIME   gabapentin (NEURONTIN) 600 MG tablet TAKE ONE-HALF TABLET BY  MOUTH TWICE DAILY   glimepiride (AMARYL) 2 MG tablet TAKE 1 TABLET BY MOUTH  TWICE DAILY   glucose blood (ACCU-CHEK GUIDE) test strip USE ONCE DAILY AS DIRECTED   ipratropium-albuterol (DUONEB) 0.5-2.5 (3) MG/3ML SOLN Take 3 mLs by nebulization every 4 (four) hours as needed.   isosorbide mononitrate (IMDUR) 60 MG 24 hr tablet TAKE 1 TABLET BY MOUTH  DAILY   LORazepam (ATIVAN) 1 MG tablet Take 1 tablet (1 mg total) by mouth 2 (two) times daily as needed for anxiety.   magnesium oxide (MAG-OX) 400 (241.3 MG) MG tablet Take 400 mg by mouth 2 (two) times daily.    metoprolol succinate (TOPROL-XL) 100 MG 24 hr tablet Take 1 tablet (100 mg total) by mouth daily.  mometasone (ELOCON) 0.1 % lotion Apply 4 drops topically at bedtime as needed (dry skin).   Multiple Vitamin (MULTIVITAMIN) tablet Take 0.5 tablets by mouth 2 (two) times daily.   Multiple Vitamins-Minerals (ICAPS) CAPS Take 1 capsule by mouth 2 (two) times daily.    nitroGLYCERIN (NITROSTAT) 0.4 MG SL tablet PLACE 1 TABLET UNDER THE TONGUE EVERY 5 MINUTES AS NEEDED FOR CHEST PAIN   pantoprazole (PROTONIX) 40 MG tablet Take 1 tablet (40 mg total)  by mouth 2 (two) times daily.   VITAMIN D PO Take by mouth daily.    vitamin E 180 MG (400 UNITS) capsule Take 400 Units by mouth daily.   zinc sulfate (ZINC-220) 220 (50 Zn) MG capsule Take 220 mg by mouth daily.   Facility-Administered Encounter Medications as of 06/13/2020  Medication   ondansetron (ZOFRAN) 8 mg, dexamethasone (DECADRON) 10 mg in sodium chloride 0.9 % 50 mL IVPB    Allergies (verified) Coconut fatty acids   History: Past Medical History:  Diagnosis Date   Abnormal CT lung screening 10/17/2015   COPD (chronic obstructive pulmonary disease) (HCC)    Coronary artery disease, non-occlusive    a. cath 2006: min nonobs CAD; b. cath 12/2010: cath LAD 50%, RCA 60%; c. 08/2013: Minimal luminal irregs, right dominant system with no significant CAD, diffuse luminal irregs noted. Normal EF 55%, no AS or MS.    Diabetes mellitus    Hyperlipemia    Followed by Dr. Rosanna Randy   Hypertension    Lung cancer [facility])    Macular degeneration    rt   Personal history of tobacco use, presenting hazards to health 10/15/2015   Stroke [facility])    Past Surgical History:  Procedure Laterality Date   ABDOMINAL HYSTERECTOMY     ANTERIOR CERVICAL DECOMP/DISCECTOMY FUSION N/A 10/19/2013   Procedure: CERVICAL FIVE-SIX ANTERIOR CERVICAL DECOMPRESSION WITH FUSION INTERBODY PROSTHESIS PLATING AND PEEK CAGE;  Surgeon: Ophelia Charter, MD;  Location: [place] [facility]URO ORS;  Service: Neurosurgery;  Laterality: N/A;   BACK SURGERY  80's   BREAST CYST EXCISION Left    left negative    CARDIAC CATHETERIZATION  05/2004   CATARACT EXTRACTION Left    CHOLECYSTECTOMY     COLONOSCOPY N/A 12/29/2018   Procedure: COLONOSCOPY;  Surgeon: Toledo, Benay Pike, MD;  Location: [facility] ENDOSCOPY;  Service: Gastroenterology;  Laterality: N/A;   ENDARTERECTOMY Left 10/24/2014   Procedure: ENDARTERECTOMY CAROTID;  Surgeon: Algernon Huxley, MD;  Location: [facility] ORS;  Service: Vascular;  Laterality: Left;    ENDOBRONCHIAL ULTRASOUND N/A 11/14/2015   Procedure: ENDOBRONCHIAL ULTRASOUND;  Surgeon: [name], MD;  Location: [facility] ORS;  Service: Cardiopulmonary;  Laterality: N/A;   PERIPHERAL VASCULAR CATHETERIZATION N/A 12/04/2015   Procedure: Glori Luis Cath Insertion;  Surgeon: Algernon Huxley, MD;  Location: [place] CV LAB;  Service: Cardiovascular;  Laterality: N/A;   PORTA CATH REMOVAL N/A 05/17/2017   Procedure: PORTA CATH REMOVAL;  Surgeon: Algernon Huxley, MD;  Location: [place] CV LAB;  Service: Cardiovascular;  Laterality: N/A;   TONSILLECTOMY AND ADENOIDECTOMY     VESICOVAGINAL FISTULA CLOSURE W/ TAH     Family History  Problem Relation Age of Onset   Stroke Mother    Heart attack Father    Heart attack Brother    Heart attack Brother    Lung cancer Maternal Grandfather    Heart attack Paternal Grandmother        MI   Social History   Socioeconomic History   Marital status:  Married    Spouse name: Not on file   Number of children: 4   Years of education: Not on file   Highest education level: 11th grade  Occupational History   Occupation: Retired, Licensed conveyancer: RETIRED  Tobacco Use   Smoking status: Former Smoker    Packs/day: 1.00    Years: 50.00    Pack years: 50.00    Types: Cigarettes    Quit date: 10/03/2015    Years since quitting: 4.6   Smokeless tobacco: Never Used   Tobacco comment: smokes 3 cigs daily 05/06/15. Pt instructed to quit.  Vaping Use   Vaping Use: Never used  Substance and Sexual Activity   Alcohol use: No    Alcohol/week: 0.0 standard drinks   Drug use: No   Sexual activity: Never  Other Topics Concern   Not on file  Social History Narrative   Married with 4 children   Lives at home with her husband.  Ambulates with a cane.   Social Determinants of Health   Financial Resource Strain: Low Risk    Difficulty of Paying Living Expenses: Not hard at all  Food Insecurity: No Food Insecurity   Worried  About Charity fundraiser in the Last Year: Never true   [place] in the Last Year: Never true  Transportation Needs: No Transportation Needs   Lack of Transportation (Medical): No   Lack of Transportation (Non-Medical): No  Physical Activity: Inactive   Days of Exercise per Week: 0 days   Minutes of Exercise per Session: 0 min  Stress: No Stress Concern Present   Feeling of Stress : Not at all  Social Connections: Moderately Isolated   Frequency of Communication with Friends and Family: Twice a week   Frequency of Social Gatherings with Friends and Family: More than three times a week   Attends Religious Services: Never   Marine scientist or Organizations: No   Attends Archivist Meetings: Never   Marital Status: Married    Tobacco Counseling Counseling given: Not Answered Comment: smokes 3 cigs daily 05/06/15. Pt instructed to quit.   Clinical Intake:  Pre-visit preparation completed: Yes  Pain : No/denies pain     Nutritional Risks: None Diabetes: Yes  How often do you need to have someone help you when you read instructions, pamphlets, or other written materials from your doctor or pharmacy?: 1 - Never  Diabetic? Yes  Nutrition Risk Assessment:  Has the patient had any N/V/D within the last 2 months?  No  Does the patient have any non-healing wounds?  No  Has the patient had any unintentional weight loss or weight gain?  No   Diabetes:  Is the patient diabetic?  Yes  If diabetic, was a CBG obtained today?  No  Did the patient bring in their glucometer from home?  No  How often do you monitor your CBG's? Once a day.   Financial Strains and Diabetes Management:  Are you having any financial strains with the device, your supplies or your medication? No .  Does the patient want to be seen by Chronic Care Management for management of their diabetes?  No  Would the patient like to be referred to a Nutritionist or for Diabetic  Management?  No   Diabetic Exams:  Diabetic Eye Exam: Overdue for diabetic eye exam. Pt has been advised about the importance in completing this exam. Patient advised to call and schedule an  eye exam. Diabetic Foot Exam: Overdue, Pt has been advised about the importance in completing this exam.    Interpreter Needed?: No  Information entered by :: [facility], LPN   Activities of Daily Living In your present state of health, do you have any difficulty performing the following activities: 06/13/2020  Hearing? N  Vision? N  Difficulty concentrating or making decisions? Y  Comment Has dementia  Walking or climbing stairs? Y  Comment Wheelchair bound  Dressing or bathing? Y  Comment Husband helps dress and bath.  Doing errands, shopping? Y  Comment Does not drive.  Preparing Food and eating ? Y  Comment Daughter manages the cooking.  Using the Toilet? Y  Comment Husband takes pt to the bathroom.  In the past six months, have you accidently leaked urine? Y  Comment Wears depends at all times.  Do you have problems with loss of bowel control? Y  Comment Wears depends at all times.  Managing your Medications? Y  Comment Daughter prepares medication box.  Managing your Finances? Y  Comment Husband manages finances.  Housekeeping or managing your Housekeeping? Y  Comment Daughter manages housework.  Some recent data might be hidden    Patient Care Team: Jerrol Banana., MD as PCP - General (Unknown Physician Specialty) [name], MD as PCP - Cardiology (Cardiology) [name], MD as Consulting Physician (Vascular Surgery) [place], [place] as Consulting Physician (Optometry) [name], Kathlene November, MD as Consulting Physician (Oncology) [name], MD as Referring Physician (Radiation Oncology) Murlean Iba, MD as Consulting Physician (Internal Medicine) Isaias Sakai, MD as Referring Physician (Ophthalmology)  Indicate any recent Medical Services you may  have received from other than [facility] providers in the past year (date may be approximate).     Assessment:   This is a routine wellness examination for Cassandr.  Hearing/Vision screen No exam data present  Dietary issues and exercise activities discussed: Current Exercise Habits: The patient does not participate in regular exercise at present, Exercise limited by: psychological condition(s);orthopedic condition(s)  Goals      Patient Stated     I want to stay well (pt-stated)      Current Barriers:   High pill burden  Pharmacist Clinical Goal(s):   Over the next 0- days, patient will demonstrate improved understanding of prescribed medications and rationale for usage as evidenced by adherence to medications by patient report and fill history  Interventions:  Comprehensive medication review performed.  Discussed plans with patient for ongoing care management follow up and provided patient with direct contact information for care management team  Patient Self Care Activities:   Self administers medications as prescribed  Attends all scheduled provider appointments  Calls provider office for new concerns or questions  Initial goal documentation         Other     DIET - INCREASE WATER INTAKE      Recommend to cut back on caffeine intake and increase water intake to 6-8 8oz glasses a day.      Depression Screen PHQ 2/9 Scores 06/13/2020 06/08/2019 06/07/2018 06/07/2018 05/31/2017 07/22/2016 06/18/2016  PHQ - 2 Score 0 0 0 0 0 0 0  PHQ- 9 Score - - 4 - - - -    Fall Risk Fall Risk  06/13/2020 06/08/2019 06/07/2018 06/09/2017 05/31/2017  Falls in the past year? 0 1 1 Yes Yes  Number falls in past yr: 0 _0 or more 2 or more  Injury with Fall?  0 0 0 - No  Comment - - - - -  Risk Factor Category  - - - High Fall Risk High Fall Risk  Risk for fall due to : - Impaired balance/gait;Impaired mobility Impaired mobility;Impaired balance/gait - Impaired balance/gait  Risk for fall due  to: Comment - - - - -  Follow up - Falls prevention discussed Falls prevention discussed - Falls prevention discussed    FALL RISK PREVENTION PERTAINING TO THE HOME:  Any stairs in or around the home? Yes  If so, are there any without handrails? No  Home free of loose throw rugs in walkways, pet beds, electrical cords, etc? Yes  Adequate lighting in your home to reduce risk of falls? Yes   ASSISTIVE DEVICES UTILIZED TO PREVENT FALLS:  Life alert? No  Use of a cane, walker or w/c? Yes  Grab bars in the bathroom? Yes  Shower chair or bench in shower? Yes  Elevated toilet seat or a handicapped toilet? Yes    Cognitive Function: Unable to complete due to speaking with stepdaughter.   MMSE - Mini Mental State Exam 06/14/2019  Orientation to time 1  Orientation to Place 4  Registration 2  Attention/ Calculation 3  Recall 2  Language- name 2 objects 2  Language- repeat 1  Language- follow 3 step command 3  Language- read & follow direction 1  Write a sentence 1  Copy design 1  Total score 21        Immunizations Immunization History  Administered Date(s) Administered   Fluad Quad(high Dose 65+) 12/30/2018, 03/14/2020   Influenza Split 01/17/2010, 01/05/2012, 02/29/2012   Influenza Whole 12/05/2009, 12/10/2010   Influenza, High Dose Seasonal PF 02/14/2014, 01/01/2015, 12/29/2016, 01/25/2018   Influenza,inj,Quad PF,6+ Mos 01/04/2013, 02/27/2013, 03/19/2016   Influenza-Unspecified 02/04/2014   PFIZER(Purple Top)SARS-COV-2 Vaccination 05/29/2019, 06/20/2019, 04/10/2020   Pneumococcal Conjugate-13 02/29/2012   Pneumococcal Polysaccharide-23 01/05/2008, 01/05/2012   Tdap 01/05/2012    TDAP status: Up to date  Flu Vaccine status: Up to date  Pneumococcal vaccine status: Up to date  Covid-19 vaccine status: Completed vaccines  Qualifies for Shingles Vaccine? Yes   Zostavax completed No   Shingrix Completed?: No.    Education has been provided regarding the  importance of this vaccine. Patient has been advised to call insurance company to determine out of pocket expense if they have not yet received this vaccine. Advised may also receive vaccine at local pharmacy or Health Dept. Verbalized acceptance and understanding.  Screening Tests Health Maintenance  Topic Date Due   URINE MICROALBUMIN  12/29/2017   FOOT EXAM  05/24/2018   OPHTHALMOLOGY EXAM  04/09/2020   HEMOGLOBIN A1C  09/12/2020   COVID-19 Vaccine (4 - Booster for Pfizer series) 10/08/2020   TETANUS/TDAP  01/04/2022   DEXA SCAN  01/20/2022   INFLUENZA VACCINE  Completed   PNA vac Low Risk Adult  Completed   HPV VACCINES  Aged Out    Health Maintenance  Health Maintenance Due  Topic Date Due   URINE MICROALBUMIN  12/29/2017   FOOT EXAM  05/24/2018   OPHTHALMOLOGY EXAM  04/09/2020    Colorectal cancer screening: No longer required.   Mammogram status: No longer required due to age.  Bone Density status: Completed 01/21/12. Previous DEXA scan was normal. No repeat needed unless advised by a physician.  Lung Cancer Screening: (Low Dose CT Chest recommended if Age 70-80 years, 30 pack-year currently smoking OR have quit w/in 15years.) does not qualify.   Additional Screening:  Vision Screening: Recommended annual ophthalmology exams for early detection of glaucoma and other disorders of the eye. Is the patient up to date with their annual eye exam?  Yes  Who is the provider or what is the name of the office in which the patient attends annual eye exams? Dr Roosevelt Locks @ [place] If pt is not established with a provider, would they like to be referred to a provider to establish care? No .   Dental Screening: Recommended annual dental exams for proper oral hygiene  Community Resource Referral / Chronic Care Management: CRR required this visit?  No   CCM required this visit?  No      Plan:     I have personally reviewed and noted the following in the patients  chart:    Medical and social history  Use of alcohol, tobacco or illicit drugs   Current medications and supplements  Functional ability and status  Nutritional status  Physical activity  Advanced directives  List of other physicians  Hospitalizations, surgeries, and ER visits in previous 12 months  Vitals  Screenings to include cognitive, depression, and falls  Referrals and appointments  In addition, I have reviewed and discussed with patient certain preventive protocols, quality metrics, and best practice recommendations. A written personalized care plan for preventive services as well as general preventive health recommendations were provided to patient.     Shameka Aggarwal [place], [place]   12/22/9215   Nurse Notes: Pt needs a diabetic foot exam and urine check at next in office apt. Pt plans to set up an eye exam this year.

## 2020-06-13 ENCOUNTER — Ambulatory Visit (INDEPENDENT_AMBULATORY_CARE_PROVIDER_SITE_OTHER): Payer: Medicare Other

## 2020-06-13 ENCOUNTER — Other Ambulatory Visit: Payer: Self-pay

## 2020-06-13 ENCOUNTER — Ambulatory Visit (INDEPENDENT_AMBULATORY_CARE_PROVIDER_SITE_OTHER): Payer: Medicare Other | Admitting: Vascular Surgery

## 2020-06-13 DIAGNOSIS — Z0000 Encounter for general adult medical examination without abnormal findings: Secondary | ICD-10-CM

## 2020-06-13 DIAGNOSIS — I6523 Occlusion and stenosis of bilateral carotid arteries: Secondary | ICD-10-CM | POA: Diagnosis not present

## 2020-06-13 NOTE — Patient Instructions (Signed)
[name] [name] , Thank you for taking time to come for your Medicare Wellness Visit. I appreciate your ongoing commitment to your health goals. Please review the following plan we discussed and let me know if I can assist you in the future.   Screening recommendations/referrals: Colonoscopy: No longer required.  Mammogram: No longer required.  Bone Density: Previous DEXA scan was normal. No repeat needed unless advised by a physician. Recommended yearly ophthalmology/optometry visit for glaucoma screening and checkup Recommended yearly dental visit for hygiene and checkup  Vaccinations: Influenza vaccine: Done 03/14/20 Pneumococcal vaccine: Completed series Tdap vaccine: Up to date, due 01/2022 Shingles vaccine: Shingrix discussed. Please contact your pharmacy for coverage information.     Advanced directives: Advance directive discussed with you today. Even though you declined this today please call our office should you change your mind and we can give you the proper paperwork for you to fill out.  Conditions/risks identified: Recommend to cut back on caffeine intake and increase water intake to 6-8 8oz glasses a day.  Next appointment: 08/08/20 @ 2:40 AM with Dr Rosanna Randy. Declined scheduling an AWV for 2023 at this time.    Preventive Care 26 Years and Older, Female Preventive care refers to lifestyle choices and visits with your health care provider that can promote health and wellness. What does preventive care include?  A yearly physical exam. This is also called an annual well check.  Dental exams once or twice a year.  Routine eye exams. Ask your health care provider how often you should have your eyes checked.  Personal lifestyle choices, including:  Daily care of your teeth and gums.  Regular physical activity.  Eating a healthy diet.  Avoiding tobacco and drug use.  Limiting alcohol use.  Practicing safe sex.  Taking low-dose aspirin every day.  Taking vitamin and  mineral supplements as recommended by your health care provider. What happens during an annual well check? The services and screenings done by your health care provider during your annual well check will depend on your age, overall health, lifestyle risk factors, and family history of disease. Counseling  Your health care provider may ask you questions about your:  Alcohol use.  Tobacco use.  Drug use.  Emotional well-being.  Home and relationship well-being.  Sexual activity.  Eating habits.  History of falls.  Memory and ability to understand (cognition).  Work and work Statistician.  Reproductive health. Screening  You may have the following tests or measurements:  Height, weight, and BMI.  Blood pressure.  Lipid and cholesterol levels. These may be checked every 5 years, or more frequently if you are over 74 years old.  Skin check.  Lung cancer screening. You may have this screening every year starting at age 40 if you have a 30-pack-year history of smoking and currently smoke or have quit within the past 15 years.  Fecal occult blood test (FOBT) of the stool. You may have this test every year starting at age 2.  Flexible sigmoidoscopy or colonoscopy. You may have a sigmoidoscopy every 5 years or a colonoscopy every 10 years starting at age 26.  Hepatitis C blood test.  Hepatitis B blood test.  Sexually transmitted disease (STD) testing.  Diabetes screening. This is done by checking your blood sugar (glucose) after you have not eaten for a while (fasting). You may have this done every 1-3 years.  Bone density scan. This is done to screen for osteoporosis. You may have this done starting at age 21.  Mammogram. This may be done every 1-2 years. Talk to your health care provider about how often you should have regular mammograms. Talk with your health care provider about your test results, treatment options, and if necessary, the need for more tests. Vaccines   Your health care provider may recommend certain vaccines, such as:  Influenza vaccine. This is recommended every year.  Tetanus, diphtheria, and acellular pertussis (Tdap, Td) vaccine. You may need a Td booster every 10 years.  Zoster vaccine. You may need this after age 61.  Pneumococcal 13-valent conjugate (PCV13) vaccine. One dose is recommended after age 45.  Pneumococcal polysaccharide (PPSV23) vaccine. One dose is recommended after age 18. Talk to your health care provider about which screenings and vaccines you need and how often you need them. This information is not intended to replace advice given to you by your health care provider. Make sure you discuss any questions you have with your health care provider. Document Released: 04/19/2015 Document Revised: 12/11/2015 Document Reviewed: 01/22/2015 Elsevier Interactive Patient Education  2017 [employer] Prevention in the Home Falls can cause injuries. They can happen to people of all ages. There are many things you can do to make your home safe and to help prevent falls. What can I do on the outside of my home?  Regularly fix the edges of walkways and driveways and fix any cracks.  Remove anything that might make you trip as you walk through a door, such as a raised step or threshold.  Trim any bushes or trees on the path to your home.  Use bright outdoor lighting.  Clear any walking paths of anything that might make someone trip, such as rocks or tools.  Regularly check to see if handrails are loose or broken. Make sure that both sides of any steps have handrails.  Any raised decks and porches should have guardrails on the edges.  Have any leaves, snow, or ice cleared regularly.  Use sand or salt on walking paths during winter.  Clean up any spills in your garage right away. This includes oil or grease spills. What can I do in the bathroom?  Use night lights.  Install grab bars by the toilet and in the  tub and shower. Do not use towel bars as grab bars.  Use non-skid mats or decals in the tub or shower.  If you need to sit down in the shower, use a plastic, non-slip stool.  Keep the floor dry. Clean up any water that spills on the floor as soon as it happens.  Remove soap buildup in the tub or shower regularly.  Attach bath mats securely with double-sided non-slip rug tape.  Do not have throw rugs and other things on the floor that can make you trip. What can I do in the bedroom?  Use night lights.  Make sure that you have a light by your bed that is easy to reach.  Do not use any sheets or blankets that are too big for your bed. They should not hang down onto the floor.  Have a firm chair that has side arms. You can use this for support while you get dressed.  Do not have throw rugs and other things on the floor that can make you trip. What can I do in the kitchen?  Clean up any spills right away.  Avoid walking on wet floors.  Keep items that you use a lot in easy-to-reach places.  If you need to reach  something above you, use a strong step stool that has a grab bar.  Keep electrical cords out of the way.  Do not use floor polish or wax that makes floors slippery. If you must use wax, use non-skid floor wax.  Do not have throw rugs and other things on the floor that can make you trip. What can I do with my stairs?  Do not leave any items on the stairs.  Make sure that there are handrails on both sides of the stairs and use them. Fix handrails that are broken or loose. Make sure that handrails are as long as the stairways.  Check any carpeting to make sure that it is firmly attached to the stairs. Fix any carpet that is loose or worn.  Avoid having throw rugs at the top or bottom of the stairs. If you do have throw rugs, attach them to the floor with carpet tape.  Make sure that you have a light switch at the top of the stairs and the bottom of the stairs. If you  do not have them, ask someone to add them for you. What else can I do to help prevent falls?  Wear shoes that:  Do not have high heels.  Have rubber bottoms.  Are comfortable and fit you well.  Are closed at the toe. Do not wear sandals.  If you use a stepladder:  Make sure that it is fully opened. Do not climb a closed stepladder.  Make sure that both sides of the stepladder are locked into place.  Ask someone to hold it for you, if possible.  Clearly mark and make sure that you can see:  Any grab bars or handrails.  First and last steps.  Where the edge of each step is.  Use tools that help you move around (mobility aids) if they are needed. These include:  Canes.  Walkers.  Scooters.  Crutches.  Turn on the lights when you go into a dark area. Replace any light bulbs as soon as they burn out.  Set up your furniture so you have a clear path. Avoid moving your furniture around.  If any of your floors are uneven, fix them.  If there are any pets around you, be aware of where they are.  Review your medicines with your doctor. Some medicines can make you feel dizzy. This can increase your chance of falling. Ask your doctor what other things that you can do to help prevent falls. This information is not intended to replace advice given to you by your health care provider. Make sure you discuss any questions you have with your health care provider. Document Released: 01/17/2009 Document Revised: 08/29/2015 Document Reviewed: 04/27/2014 Elsevier Interactive Patient Education  2017 [employer].

## 2020-06-14 ENCOUNTER — Other Ambulatory Visit: Payer: Self-pay | Admitting: Family Medicine

## 2020-06-14 DIAGNOSIS — K2100 Gastro-esophageal reflux disease with esophagitis, without bleeding: Secondary | ICD-10-CM

## 2020-06-15 ENCOUNTER — Other Ambulatory Visit: Payer: Self-pay | Admitting: Family Medicine

## 2020-06-15 DIAGNOSIS — K2100 Gastro-esophageal reflux disease with esophagitis, without bleeding: Secondary | ICD-10-CM

## 2020-06-16 NOTE — Telephone Encounter (Signed)
Requested medication (s) are due for refill today: no  Requested medication (s) are on the active medication list: requested med is a delayed release- original order DR is not noted  Last refill:  11/14/19  Future visit scheduled: yes  Notes to clinic:  please review requested med    Requested Prescriptions  Pending Prescriptions Disp Refills   pantoprazole (PROTONIX) 40 MG tablet [Pharmacy Med Name: Pantoprazole Sodium 40 MG Oral Tablet Delayed Release] 90 tablet 3    Sig: TAKE 1 TABLET BY MOUTH  DAILY      Gastroenterology: Proton Pump Inhibitors Passed - 06/15/2020  9:29 PM      Passed - Valid encounter within last 12 months    Recent Outpatient Visits           2 months ago Encounter for immunization   [facility] Jerrol Banana., MD   3 months ago Diabetic polyneuropathy associated with type 2 diabetes mellitus [facility])   [facility] Jerrol Banana., MD   4 months ago Chronic obstructive pulmonary disease with acute exacerbation [facility])   [facility] Jerrol Banana., MD   7 months ago Eczema, unspecified type   [facility] Jerrol Banana., MD   9 months ago Failure to thrive in [place] Rosanna Randy, Retia Passe., MD       Future Appointments             In 1 month Jerrol Banana., MD [facility]

## 2020-06-18 ENCOUNTER — Encounter (INDEPENDENT_AMBULATORY_CARE_PROVIDER_SITE_OTHER): Payer: Self-pay | Admitting: *Deleted

## 2020-06-18 ENCOUNTER — Other Ambulatory Visit: Payer: Self-pay | Admitting: Family Medicine

## 2020-06-18 DIAGNOSIS — E1159 Type 2 diabetes mellitus with other circulatory complications: Secondary | ICD-10-CM | POA: Diagnosis not present

## 2020-06-18 DIAGNOSIS — Z7984 Long term (current) use of oral hypoglycemic drugs: Secondary | ICD-10-CM | POA: Diagnosis not present

## 2020-06-18 DIAGNOSIS — Z7902 Long term (current) use of antithrombotics/antiplatelets: Secondary | ICD-10-CM | POA: Diagnosis not present

## 2020-06-18 DIAGNOSIS — E1142 Type 2 diabetes mellitus with diabetic polyneuropathy: Secondary | ICD-10-CM | POA: Diagnosis not present

## 2020-06-18 DIAGNOSIS — G64 Other disorders of peripheral nervous system: Secondary | ICD-10-CM | POA: Diagnosis not present

## 2020-06-18 DIAGNOSIS — Z9049 Acquired absence of other specified parts of digestive tract: Secondary | ICD-10-CM | POA: Diagnosis not present

## 2020-06-18 DIAGNOSIS — E538 Deficiency of other specified B group vitamins: Secondary | ICD-10-CM | POA: Diagnosis not present

## 2020-06-18 DIAGNOSIS — M4722 Other spondylosis with radiculopathy, cervical region: Secondary | ICD-10-CM | POA: Diagnosis not present

## 2020-06-18 DIAGNOSIS — H3530 Unspecified macular degeneration: Secondary | ICD-10-CM | POA: Diagnosis not present

## 2020-06-18 DIAGNOSIS — M4697 Unspecified inflammatory spondylopathy, lumbosacral region: Secondary | ICD-10-CM | POA: Diagnosis not present

## 2020-06-18 DIAGNOSIS — Z87891 Personal history of nicotine dependence: Secondary | ICD-10-CM | POA: Diagnosis not present

## 2020-06-18 DIAGNOSIS — D709 Neutropenia, unspecified: Secondary | ICD-10-CM | POA: Diagnosis not present

## 2020-06-18 DIAGNOSIS — I6523 Occlusion and stenosis of bilateral carotid arteries: Secondary | ICD-10-CM | POA: Diagnosis not present

## 2020-06-18 DIAGNOSIS — E43 Unspecified severe protein-calorie malnutrition: Secondary | ICD-10-CM | POA: Diagnosis not present

## 2020-06-18 DIAGNOSIS — Z8673 Personal history of transient ischemic attack (TIA), and cerebral infarction without residual deficits: Secondary | ICD-10-CM | POA: Diagnosis not present

## 2020-06-18 DIAGNOSIS — Z85118 Personal history of other malignant neoplasm of bronchus and lung: Secondary | ICD-10-CM | POA: Diagnosis not present

## 2020-06-18 DIAGNOSIS — G542 Cervical root disorders, not elsewhere classified: Secondary | ICD-10-CM | POA: Diagnosis not present

## 2020-06-18 DIAGNOSIS — M109 Gout, unspecified: Secondary | ICD-10-CM | POA: Diagnosis not present

## 2020-06-18 DIAGNOSIS — E782 Mixed hyperlipidemia: Secondary | ICD-10-CM | POA: Diagnosis not present

## 2020-06-18 DIAGNOSIS — I10 Essential (primary) hypertension: Secondary | ICD-10-CM | POA: Diagnosis not present

## 2020-06-18 DIAGNOSIS — J439 Emphysema, unspecified: Secondary | ICD-10-CM | POA: Diagnosis not present

## 2020-06-18 DIAGNOSIS — K5900 Constipation, unspecified: Secondary | ICD-10-CM | POA: Diagnosis not present

## 2020-06-18 DIAGNOSIS — I2510 Atherosclerotic heart disease of native coronary artery without angina pectoris: Secondary | ICD-10-CM | POA: Diagnosis not present

## 2020-06-19 ENCOUNTER — Other Ambulatory Visit: Payer: Self-pay | Admitting: Family Medicine

## 2020-06-19 DIAGNOSIS — E1142 Type 2 diabetes mellitus with diabetic polyneuropathy: Secondary | ICD-10-CM

## 2020-06-19 NOTE — Telephone Encounter (Signed)
Requested Prescriptions  Pending Prescriptions Disp Refills  . glucose blood (ACCU-CHEK GUIDE) test strip [Pharmacy Med Name: T STRIP  S ACCU CHEK GUIDE] 100 strip 2    Sig: [place]     Endocrinology: Diabetes - Testing Supplies Passed - 06/19/2020  9:57 PM      Passed - Valid encounter within last 12 months    Recent Outpatient Visits          2 months ago Encounter for immunization   [facility] Jerrol Banana., MD   3 months ago Diabetic polyneuropathy associated with type 2 diabetes mellitus [facility])   [facility] Jerrol Banana., MD   4 months ago Chronic obstructive pulmonary disease with acute exacerbation [facility])   [facility] Jerrol Banana., MD   7 months ago Eczema, unspecified type   [facility] Jerrol Banana., MD   9 months ago Failure to thrive in adult   [facility] Rosanna Randy, Retia Passe., MD      Future Appointments            In 1 month Jerrol Banana., MD [facility], [facility]

## 2020-06-20 ENCOUNTER — Other Ambulatory Visit: Payer: Self-pay

## 2020-06-20 MED ORDER — ACCU-CHEK FASTCLIX LANCETS MISC: 1 refills | Status: DC

## 2020-06-23 DIAGNOSIS — R0602 Shortness of breath: Secondary | ICD-10-CM | POA: Diagnosis not present

## 2020-06-23 DIAGNOSIS — J449 Chronic obstructive pulmonary disease, unspecified: Secondary | ICD-10-CM | POA: Diagnosis not present

## 2020-06-25 DIAGNOSIS — J449 Chronic obstructive pulmonary disease, unspecified: Secondary | ICD-10-CM | POA: Diagnosis not present

## 2020-07-01 DIAGNOSIS — J439 Emphysema, unspecified: Secondary | ICD-10-CM | POA: Diagnosis not present

## 2020-07-01 DIAGNOSIS — M4697 Unspecified inflammatory spondylopathy, lumbosacral region: Secondary | ICD-10-CM | POA: Diagnosis not present

## 2020-07-01 DIAGNOSIS — Z7984 Long term (current) use of oral hypoglycemic drugs: Secondary | ICD-10-CM | POA: Diagnosis not present

## 2020-07-01 DIAGNOSIS — Z8673 Personal history of transient ischemic attack (TIA), and cerebral infarction without residual deficits: Secondary | ICD-10-CM | POA: Diagnosis not present

## 2020-07-01 DIAGNOSIS — Z9049 Acquired absence of other specified parts of digestive tract: Secondary | ICD-10-CM | POA: Diagnosis not present

## 2020-07-01 DIAGNOSIS — G542 Cervical root disorders, not elsewhere classified: Secondary | ICD-10-CM | POA: Diagnosis not present

## 2020-07-01 DIAGNOSIS — E1142 Type 2 diabetes mellitus with diabetic polyneuropathy: Secondary | ICD-10-CM | POA: Diagnosis not present

## 2020-07-01 DIAGNOSIS — H3530 Unspecified macular degeneration: Secondary | ICD-10-CM | POA: Diagnosis not present

## 2020-07-01 DIAGNOSIS — E43 Unspecified severe protein-calorie malnutrition: Secondary | ICD-10-CM | POA: Diagnosis not present

## 2020-07-01 DIAGNOSIS — I2510 Atherosclerotic heart disease of native coronary artery without angina pectoris: Secondary | ICD-10-CM | POA: Diagnosis not present

## 2020-07-01 DIAGNOSIS — M109 Gout, unspecified: Secondary | ICD-10-CM | POA: Diagnosis not present

## 2020-07-01 DIAGNOSIS — Z87891 Personal history of nicotine dependence: Secondary | ICD-10-CM | POA: Diagnosis not present

## 2020-07-01 DIAGNOSIS — K5900 Constipation, unspecified: Secondary | ICD-10-CM | POA: Diagnosis not present

## 2020-07-01 DIAGNOSIS — I10 Essential (primary) hypertension: Secondary | ICD-10-CM | POA: Diagnosis not present

## 2020-07-01 DIAGNOSIS — Z85118 Personal history of other malignant neoplasm of bronchus and lung: Secondary | ICD-10-CM | POA: Diagnosis not present

## 2020-07-01 DIAGNOSIS — E1159 Type 2 diabetes mellitus with other circulatory complications: Secondary | ICD-10-CM | POA: Diagnosis not present

## 2020-07-01 DIAGNOSIS — D709 Neutropenia, unspecified: Secondary | ICD-10-CM | POA: Diagnosis not present

## 2020-07-01 DIAGNOSIS — E538 Deficiency of other specified B group vitamins: Secondary | ICD-10-CM | POA: Diagnosis not present

## 2020-07-01 DIAGNOSIS — E782 Mixed hyperlipidemia: Secondary | ICD-10-CM | POA: Diagnosis not present

## 2020-07-01 DIAGNOSIS — G64 Other disorders of peripheral nervous system: Secondary | ICD-10-CM | POA: Diagnosis not present

## 2020-07-01 DIAGNOSIS — Z7902 Long term (current) use of antithrombotics/antiplatelets: Secondary | ICD-10-CM | POA: Diagnosis not present

## 2020-07-01 DIAGNOSIS — M4722 Other spondylosis with radiculopathy, cervical region: Secondary | ICD-10-CM | POA: Diagnosis not present

## 2020-07-01 DIAGNOSIS — I6523 Occlusion and stenosis of bilateral carotid arteries: Secondary | ICD-10-CM | POA: Diagnosis not present

## 2020-07-12 ENCOUNTER — Other Ambulatory Visit: Payer: Self-pay

## 2020-07-12 ENCOUNTER — Telehealth: Payer: Self-pay | Admitting: Family Medicine

## 2020-07-12 ENCOUNTER — Ambulatory Visit: Payer: Self-pay | Admitting: *Deleted

## 2020-07-12 ENCOUNTER — Ambulatory Visit (INDEPENDENT_AMBULATORY_CARE_PROVIDER_SITE_OTHER): Payer: Medicare Other | Admitting: Physician Assistant

## 2020-07-12 ENCOUNTER — Encounter: Payer: Self-pay | Admitting: Physician Assistant

## 2020-07-12 VITALS — BP 110/53 | HR 55 | Temp 97.7°F | Resp 18 | Ht 65.0 in

## 2020-07-12 DIAGNOSIS — J432 Centrilobular emphysema: Secondary | ICD-10-CM

## 2020-07-12 DIAGNOSIS — L98421 Non-pressure chronic ulcer of back limited to breakdown of skin: Secondary | ICD-10-CM | POA: Diagnosis not present

## 2020-07-12 DIAGNOSIS — J9611 Chronic respiratory failure with hypoxia: Secondary | ICD-10-CM | POA: Diagnosis not present

## 2020-07-12 MED ORDER — COMFEEL PLUS ULCER DRESSING EX PADS
MEDICATED_PAD | CUTANEOUS | 0 refills | Status: DC
Start: 2020-07-12 — End: 2020-11-27

## 2020-07-12 MED ORDER — RESTORE BARRIER CREA: TOPICAL_CREAM | 0 refills | Status: DC

## 2020-07-12 NOTE — Telephone Encounter (Signed)
Pt's step daughter,[name] [name],called stating the pt has a 2 new ulcer on her buttocks; she noticed them 4 days ago; the wounds are located at buttock crease; they have been applying diaper cream and antibiotic cream to the area; she says the areas are sore and painful; the pain is constant and rated 9 out of 10; she says the areas are reddened and the skin is starting to break open; the pt wears adult diapers; her last home health visit was 06/2820; she has given the pt Tylenol 1000 mg every 6 hrs but the pt has no relief in pain; recommendations made per nurse triage protocol; pt's step daughter also informed max dose for Tylenol is 1000 mg x every 8 hours; she verbalized understanding; they would like a virtual appt; decision tree completed; the pt sees Dr Miguel Aschoff, [facility]; this provider has no availability within the suggested time frame; she accepted appt on behalf of the pt on 07/16/20 at 1000 even though this is later than the suggested timeframe (this is his first available appt); she was also advised to have the pt evaluated at Urgent Care or ED due to the symptoms; she verbalized understanding and will discuss this with the pt and the pt's husband; they would like for the pt to be seen in the office sooner than the scheduled appt if possible; Morey Hummingbird can be contacted at [phone]; will route to office for notification and final disposition.  Reason for Disposition . Looks like a boil or deep ulcer  Answer Assessment - Initial Assessment Questions 1. APPEARANCE of SORES: "What do the sores look like?"   Red and break open 2. NUMBER: "How many sores are there?"     2 3. SIZE: "How big is the largest sore?"   Penny-sized 4. LOCATION: "Where are the sores located?"      5. ONSET: "When did the sores begin?"     Noticed on  6. CAUSE: "What do you think is causing the sores?"   History pressure ulcers 7. OTHER SYMPTOMS: "Do you have any other symptoms?" (e.g., fever, new  weakness)    no  Answer Assessment - Initial Assessment Questions 1. LOCATION: "Where is the wound located?"   2. WOUND APPEARANCE: "What does the wound look like?"        Reddened; skin starting to open 3. SIZE: If redness is present, ask: "What is the size of the red area?" (Inches, centimeters, or compare to size of a coin)      Penny-sized 4. SPREAD: "What's changed in the last day?"  "Do you see any red streaks coming from the wound?"    n/a 5. ONSET: "When did it start to look infected?"      Noticed on 07/08/20 6. MECHANISM: "How did the wound start, what was the cause?"    Pt has history of pressure ulcers 7. PAIN: "Is there any pain?" If Yes, ask: "How bad is the pain?"   (Scale 1-10; or mild, moderate, severe)     Rated 9 out of 10 8. FEVER: "Do you have a fever?" If Yes, ask: "What is your temperature, how was it measured, and when did it start?"     no 9. OTHER SYMPTOMS: "Do you have any other symptoms?" (e.g., shaking chills, weakness, rash elsewhere on body)  no 10. PREGNANCY: "Is there any chance you are pregnant?" "When was your last menstrual period?"     no  Protocols used: SORES-A-AH, WOUND INFECTION-A-AH

## 2020-07-12 NOTE — Progress Notes (Signed)
Established patient visit   Patient: [name] [name]   DOB: 15-May-1940   80 y.o. Female  MRN: [id number] Visit Date: 07/12/2020  Today's healthcare provider: Trinna Post, PA-C   Chief Complaint  Patient presents with  . Pressure Ulcer    2 skin ulcers at the top of her sacrum x 4 days. The patient husband said he been treating it with diaper rash cream and antibiotic cream.    Subjective    HPI HPI    Pressure Ulcer    Comments: 2 skin ulcers at the top of her sacrum x 4 days. The patient husband said he been treating it with diaper rash cream and antibiotic cream.        Last edited by Wilson Singer, CMA on 07/12/2020  1:32 PM. (History)      Patient with history of COPD and chronic respiratory failure on continuous oxygen, DM, failure to thrive presents today with sore low back and bottom. She has a history of sacral pressure ulcers, most recently addressed 06/2019. She was advised to shift positions as often as possible, and home health nursing was sent out to dress wound. Advised on barrier cream and ulcer dressings. Husband reports today that nursing has come out and provided dressing and barrier cream. Patient's husband reports today he was using dressings and barrier cream but recently ran out of dressings, did not ask nursing staff for me.   Patient inquiring about portable oxygen concentrator. Oxygen gets down to 70-s and 80s when off oxygen. Wears oxygen continuously. Currently using an oxygen tank.      Medications: Outpatient Medications Prior to Visit  Medication Sig  . Accu-Chek FastClix Lancets MISC Check blood sugar up to 4 times daily.  Marland Kitchen albuterol (VENTOLIN HFA) 108 (90 Base) MCG/ACT inhaler Inhale 2 puffs into the lungs every 4 (four) hours as needed for wheezing or shortness of breath.  Marland Kitchen amLODipine (NORVASC) 5 MG tablet TAKE 1 TABLET BY MOUTH EVERY DAY  . aspirin EC 81 MG EC tablet Take 1 tablet (81 mg total) by mouth daily.  Marland Kitchen atorvastatin  (LIPITOR) 40 MG tablet TAKE 1 TABLET BY MOUTH  DAILY AT 6 PM  . Blood Glucose Monitoring Suppl (ACCU-CHEK GUIDE) w/Device KIT USE AS DIRECTED  . clopidogrel (PLAVIX) 75 MG tablet TAKE 1 TABLET BY MOUTH  DAILY  . donepezil (ARICEPT) 5 MG tablet TAKE 1 TABLET BY MOUTH EVERYDAY AT BEDTIME  . gabapentin (NEURONTIN) 600 MG tablet TAKE ONE-HALF TABLET BY  MOUTH TWICE DAILY  . glimepiride (AMARYL) 2 MG tablet TAKE 1 TABLET BY MOUTH  TWICE DAILY  . glucose blood (ACCU-CHEK GUIDE) test strip CHECK BLOOD SUGAR ONCE  DAILY  . ipratropium-albuterol (DUONEB) 0.5-2.5 (3) MG/3ML SOLN Take 3 mLs by nebulization every 4 (four) hours as needed.  . isosorbide mononitrate (IMDUR) 60 MG 24 hr tablet TAKE 1 TABLET BY MOUTH  DAILY  . magnesium oxide (MAG-OX) 400 (241.3 MG) MG tablet Take 400 mg by mouth 2 (two) times daily.   . metoprolol succinate (TOPROL-XL) 100 MG 24 hr tablet Take 1 tablet (100 mg total) by mouth daily.  . Multiple Vitamin (MULTIVITAMIN) tablet Take 0.5 tablets by mouth 2 (two) times daily.  . Multiple Vitamins-Minerals (ICAPS) CAPS Take 1 capsule by mouth 2 (two) times daily.   . nitroGLYCERIN (NITROSTAT) 0.4 MG SL tablet PLACE 1 TABLET UNDER THE TONGUE EVERY 5 MINUTES AS NEEDED FOR CHEST PAIN  . pantoprazole (PROTONIX) 40 MG  tablet TAKE 1 TABLET BY MOUTH  DAILY  . VITAMIN D PO Take by mouth daily.   . vitamin E 180 MG (400 UNITS) capsule Take 400 Units by mouth daily.  . zinc sulfate 220 (50 Zn) MG capsule Take 220 mg by mouth daily.  . LORazepam (ATIVAN) 1 MG tablet Take 1 tablet (1 mg total) by mouth 2 (two) times daily as needed for anxiety. (Patient not taking: Reported on 07/12/2020)  . mometasone (ELOCON) 0.1 % lotion Apply 4 drops topically at bedtime as needed (dry skin). (Patient not taking: Reported on 07/12/2020)   Facility-Administered Medications Prior to Visit  Medication Dose Route Frequency Provider  . ondansetron (ZOFRAN) 8 mg, dexamethasone (DECADRON) 10 mg in sodium chloride 0.9  % 50 mL IVPB   Intravenous Once Finnegan, Timothy J, MD    Review of Systems  All other systems reviewed and are negative.      Objective    BP (!) 110/53 (BP Location: Right Arm, Patient Position: Sitting, Cuff Size: Normal)   Pulse (!) 55   Temp 97.7 F (36.5 C) (Temporal)   Resp 18   Ht 5' 5" (1.651 m)   SpO2 99%   BMI 19.97 kg/m     Physical Exam Constitutional:      Appearance: Normal appearance.  Cardiovascular:     Rate and Rhythm: Normal rate and regular rhythm.     Heart sounds: Normal heart sounds.  Pulmonary:     Effort: Pulmonary effort is normal.     Breath sounds: Normal breath sounds.  Skin:    General: Skin is warm and dry.     Findings: Erythema present.       Neurological:     Mental Status: She is alert and oriented to person, place, and time. Mental status is at baseline.  Psychiatric:        Mood and Affect: Mood normal.        Behavior: Behavior normal.       No results found for any visits on 07/12/20.  Assessment & Plan    1. Skin ulcer of sacrum, limited to breakdown of skin (HCC)  Called palliative care to see if they can restart visits. Left voicemail with number of [facility] and advised to contact PCP regarding arranging sacral ulcer care/restarting visits.   - protective barrier (RESTORE) CREA; Apple twice daily.  Dispense: 120 g; Refill: 0 - Wound Dressings (COMFEEL PLUS ULCER DRESSING) PADS; Apply once daily as needed.  Dispense: 30 each; Refill: 0  2. Centrilobular emphysema (HCC)  Patient and husband requesting portable oxygen concentrator. Will forward note to PCP, advised patient PCP will need to order this.   3. Chronic respiratory failure with hypoxia (HCC)    Return if symptoms worsen or fail to improve.          M , PA-C  [facility] [phone] (phone) [phone] (fax)  [facility]  

## 2020-07-12 NOTE — Patient Instructions (Addendum)
Please ask home health about barrier cream and dressings Please shift weight every couple of hours or try and stand up We will call palliative care  Pressure Injury  A pressure injury is damage to the skin and underlying tissue that results from pressure being applied to an area of the body. It often affects people who must spend a long time in a bed or chair because of a medical condition. Pressure injuries usually occur:  Over bony parts of the body, such as the tailbone, shoulders, elbows, hips, heels, spine, ankles, and back of the head.  Under medical devices that make contact with the body, such as respiratory equipment, stockings, tubes, and splints. Pressure injuries start as reddened areas on the skin and can lead to pain and an open wound. What are the causes? This condition is caused by frequent or constant pressure to an area of the body. Decreased blood flow to the skin can eventually cause the skin tissue to die and break down, causing a wound. What increases the risk? You are more likely to develop this condition if you:  Are in the hospital or an extended care facility.  Are bedridden or in a wheelchair.  Have an injury or disease that keeps you from: ? Moving normally. ? Feeling pain or pressure.  Have a condition that: ? Makes you sleepy or less alert. ? Causes poor blood flow.  Need to wear a medical device.  Have poor control of your bladder or bowel functions (incontinence).  Have poor nutrition (malnutrition). If you are at risk for pressure injuries, your health care provider may recommend certain types of mattresses, mattress covers, pillows, cushions, or boots to help prevent them. These may include products filled with air, foam, gel, or sand. What are the signs or symptoms? Symptoms of this condition depend on the severity of the injury. Symptoms may include:  Red or dark areas of the skin.  Pain, warmth, or a change of skin  texture.  Blisters.  An open wound. How is this diagnosed? This condition is diagnosed with a medical history and physical exam. You may also have tests, such as:  Blood tests.  Imaging tests.  Blood flow tests. Your pressure injury will be staged based on its severity. Staging is based on:  The depth of the tissue injury, including whether there is exposure of muscle, bone, or tendon.  The cause of the pressure injury. How is this treated? This condition may be treated by:  Relieving or redistributing pressure on your skin. This includes: ? Frequently changing your position. ? Avoiding positions that caused the wound or that can make the wound worse. ? Using specific bed mattresses, chair cushions, or protective boots. ? Moving medical devices from an area of pressure, or placing padding between the skin and the device. ? Using foams, creams, or powders to prevent rubbing (friction) on the skin.  Keeping your skin clean and dry. This may include using a skin cleanser or skin barrier as told by your health care provider.  Cleaning your injury and removing any dead tissue from the wound (debridement).  Placing a bandage (dressing) over your injury.  Using medicines for pain or to prevent or treat infection. Surgery may be needed if other treatments are not working or if your injury is very deep. Follow these instructions at home: Wound care  Follow instructions from your health care provider about how to take care of your wound. Make sure you: ? Wash your hands with  soap and water before and after you change your bandage (dressing). If soap and water are not available, use hand sanitizer. ? Change your dressing as told by your health care provider.  Check your wound every day for signs of infection. Have a caregiver do this for you if you are not able. Check for: ? Redness, swelling, or increased pain. ? More fluid or blood. ? Warmth. ? Pus or a bad smell. Skin  care  Keep your skin clean and dry. Gently pat your skin dry.  Do not rub or massage your skin.  You or a caregiver should check your skin every day for any changes in color or any new blisters or sores (ulcers). Medicines  Take over-the-counter and prescription medicines only as told by your health care provider.  If you were prescribed an antibiotic medicine, take or apply it as told by your health care provider. Do not stop using the antibiotic even if your condition improves. Reducing and redistributing pressure  Do not lie or sit in one position for a long time. Move or change position every 1-2 hours, or as told by your health care provider.  Use pillows or cushions to reduce pressure. Ask your health care provider to recommend cushions or pads for you. General instructions  Eat a healthy diet that includes lots of protein.  Drink enough fluid to keep your urine pale yellow.  Be as active as you can every day. Ask your health care provider to suggest safe exercises or activities.  Do not abuse drugs or alcohol.  Do not use any products that contain nicotine or tobacco, such as cigarettes, e-cigarettes, and chewing tobacco. If you need help quitting, ask your health care provider.  Keep all follow-up visits as told by your health care provider. This is important.   Contact a health care provider if:  You have: ? A fever or chills. ? Pain that is not helped by medicine. ? Any changes in skin color. ? New blisters or sores. ? Pus or a bad smell coming from your wound. ? Redness, swelling, or pain around your wound. ? More fluid or blood coming from your wound.  Your wound does not improve after 1-2 weeks of treatment. Summary  A pressure injury is damage to the skin and underlying tissue that results from pressure being applied to an area of the body.  Do not lie or sit in one position for a long time. Your health care provider may advise you to move or change  position every 1-2 hours.  Follow instructions from your health care provider about how to take care of your wound.  Keep all follow-up visits as told by your health care provider. This is important. This information is not intended to replace advice given to you by your health care provider. Make sure you discuss any questions you have with your health care provider. Document Revised: 10/20/2017 Document Reviewed: 10/20/2017 Elsevier Patient Education  [employer].

## 2020-07-12 NOTE — Telephone Encounter (Signed)
error 

## 2020-07-12 NOTE — Telephone Encounter (Signed)
  Additional Information . Commented on: Answer Assessment - Initial Assessment Questions    Area located at crease of buttocks  Protocols used: SORES-A-AH

## 2020-07-12 NOTE — Telephone Encounter (Signed)
Called patient and she agrees to be seen today. Appt scheduled w/ Adriana.

## 2020-07-16 ENCOUNTER — Telehealth: Payer: Self-pay | Admitting: Family Medicine

## 2020-07-16 DIAGNOSIS — H353212 Exudative age-related macular degeneration, right eye, with inactive choroidal neovascularization: Secondary | ICD-10-CM | POA: Diagnosis not present

## 2020-07-16 DIAGNOSIS — H353221 Exudative age-related macular degeneration, left eye, with active choroidal neovascularization: Secondary | ICD-10-CM | POA: Diagnosis not present

## 2020-07-16 LAB — HM DIABETES EYE EXAM

## 2020-07-17 DIAGNOSIS — Z7984 Long term (current) use of oral hypoglycemic drugs: Secondary | ICD-10-CM | POA: Diagnosis not present

## 2020-07-17 DIAGNOSIS — D709 Neutropenia, unspecified: Secondary | ICD-10-CM | POA: Diagnosis not present

## 2020-07-17 DIAGNOSIS — Z8673 Personal history of transient ischemic attack (TIA), and cerebral infarction without residual deficits: Secondary | ICD-10-CM | POA: Diagnosis not present

## 2020-07-17 DIAGNOSIS — M4697 Unspecified inflammatory spondylopathy, lumbosacral region: Secondary | ICD-10-CM | POA: Diagnosis not present

## 2020-07-17 DIAGNOSIS — M109 Gout, unspecified: Secondary | ICD-10-CM | POA: Diagnosis not present

## 2020-07-17 DIAGNOSIS — I2510 Atherosclerotic heart disease of native coronary artery without angina pectoris: Secondary | ICD-10-CM | POA: Diagnosis not present

## 2020-07-17 DIAGNOSIS — E782 Mixed hyperlipidemia: Secondary | ICD-10-CM | POA: Diagnosis not present

## 2020-07-17 DIAGNOSIS — J439 Emphysema, unspecified: Secondary | ICD-10-CM | POA: Diagnosis not present

## 2020-07-17 DIAGNOSIS — E1142 Type 2 diabetes mellitus with diabetic polyneuropathy: Secondary | ICD-10-CM | POA: Diagnosis not present

## 2020-07-17 DIAGNOSIS — I6523 Occlusion and stenosis of bilateral carotid arteries: Secondary | ICD-10-CM | POA: Diagnosis not present

## 2020-07-17 DIAGNOSIS — G64 Other disorders of peripheral nervous system: Secondary | ICD-10-CM | POA: Diagnosis not present

## 2020-07-17 DIAGNOSIS — H3530 Unspecified macular degeneration: Secondary | ICD-10-CM | POA: Diagnosis not present

## 2020-07-17 DIAGNOSIS — K5900 Constipation, unspecified: Secondary | ICD-10-CM | POA: Diagnosis not present

## 2020-07-17 DIAGNOSIS — E43 Unspecified severe protein-calorie malnutrition: Secondary | ICD-10-CM | POA: Diagnosis not present

## 2020-07-17 DIAGNOSIS — E538 Deficiency of other specified B group vitamins: Secondary | ICD-10-CM | POA: Diagnosis not present

## 2020-07-17 DIAGNOSIS — Z7902 Long term (current) use of antithrombotics/antiplatelets: Secondary | ICD-10-CM | POA: Diagnosis not present

## 2020-07-17 DIAGNOSIS — Z9049 Acquired absence of other specified parts of digestive tract: Secondary | ICD-10-CM | POA: Diagnosis not present

## 2020-07-17 DIAGNOSIS — Z85118 Personal history of other malignant neoplasm of bronchus and lung: Secondary | ICD-10-CM | POA: Diagnosis not present

## 2020-07-17 DIAGNOSIS — E1159 Type 2 diabetes mellitus with other circulatory complications: Secondary | ICD-10-CM | POA: Diagnosis not present

## 2020-07-17 DIAGNOSIS — I10 Essential (primary) hypertension: Secondary | ICD-10-CM | POA: Diagnosis not present

## 2020-07-17 DIAGNOSIS — Z87891 Personal history of nicotine dependence: Secondary | ICD-10-CM | POA: Diagnosis not present

## 2020-07-17 DIAGNOSIS — G542 Cervical root disorders, not elsewhere classified: Secondary | ICD-10-CM | POA: Diagnosis not present

## 2020-07-17 DIAGNOSIS — M4722 Other spondylosis with radiculopathy, cervical region: Secondary | ICD-10-CM | POA: Diagnosis not present

## 2020-07-24 DIAGNOSIS — J449 Chronic obstructive pulmonary disease, unspecified: Secondary | ICD-10-CM | POA: Diagnosis not present

## 2020-07-24 DIAGNOSIS — R0602 Shortness of breath: Secondary | ICD-10-CM | POA: Diagnosis not present

## 2020-07-26 DIAGNOSIS — J449 Chronic obstructive pulmonary disease, unspecified: Secondary | ICD-10-CM | POA: Diagnosis not present

## 2020-07-31 ENCOUNTER — Emergency Department: Payer: Medicare Other

## 2020-07-31 ENCOUNTER — Other Ambulatory Visit: Payer: Self-pay

## 2020-07-31 ENCOUNTER — Inpatient Hospital Stay
Admission: EM | Admit: 2020-07-31 | Discharge: 2020-08-02 | DRG: 178 | Disposition: A | Discharge status: Home Health | Payer: Medicare Other | Attending: Family Medicine | Admitting: Family Medicine

## 2020-07-31 DIAGNOSIS — J9611 Chronic respiratory failure with hypoxia: Secondary | ICD-10-CM | POA: Diagnosis present

## 2020-07-31 DIAGNOSIS — Z8673 Personal history of transient ischemic attack (TIA), and cerebral infarction without residual deficits: Secondary | ICD-10-CM

## 2020-07-31 DIAGNOSIS — N1831 Chronic kidney disease, stage 3a: Secondary | ICD-10-CM | POA: Diagnosis not present

## 2020-07-31 DIAGNOSIS — E11649 Type 2 diabetes mellitus with hypoglycemia without coma: Secondary | ICD-10-CM | POA: Diagnosis not present

## 2020-07-31 DIAGNOSIS — R0902 Hypoxemia: Secondary | ICD-10-CM | POA: Diagnosis not present

## 2020-07-31 DIAGNOSIS — N189 Chronic kidney disease, unspecified: Secondary | ICD-10-CM

## 2020-07-31 DIAGNOSIS — N179 Acute kidney failure, unspecified: Secondary | ICD-10-CM | POA: Diagnosis not present

## 2020-07-31 DIAGNOSIS — R531 Weakness: Secondary | ICD-10-CM | POA: Diagnosis not present

## 2020-07-31 DIAGNOSIS — E1122 Type 2 diabetes mellitus with diabetic chronic kidney disease: Secondary | ICD-10-CM | POA: Diagnosis present

## 2020-07-31 DIAGNOSIS — Z91018 Allergy to other foods: Secondary | ICD-10-CM | POA: Diagnosis not present

## 2020-07-31 DIAGNOSIS — Z85118 Personal history of other malignant neoplasm of bronchus and lung: Secondary | ICD-10-CM

## 2020-07-31 DIAGNOSIS — Z7984 Long term (current) use of oral hypoglycemic drugs: Secondary | ICD-10-CM

## 2020-07-31 DIAGNOSIS — Z7902 Long term (current) use of antithrombotics/antiplatelets: Secondary | ICD-10-CM

## 2020-07-31 DIAGNOSIS — H3530 Unspecified macular degeneration: Secondary | ICD-10-CM | POA: Diagnosis present

## 2020-07-31 DIAGNOSIS — Z862 Personal history of diseases of the blood and blood-forming organs and certain disorders involving the immune mechanism: Secondary | ICD-10-CM | POA: Diagnosis not present

## 2020-07-31 DIAGNOSIS — E1165 Type 2 diabetes mellitus with hyperglycemia: Secondary | ICD-10-CM | POA: Diagnosis not present

## 2020-07-31 DIAGNOSIS — I2510 Atherosclerotic heart disease of native coronary artery without angina pectoris: Secondary | ICD-10-CM | POA: Diagnosis present

## 2020-07-31 DIAGNOSIS — J3489 Other specified disorders of nose and nasal sinuses: Secondary | ICD-10-CM | POA: Diagnosis not present

## 2020-07-31 DIAGNOSIS — I129 Hypertensive chronic kidney disease with stage 1 through stage 4 chronic kidney disease, or unspecified chronic kidney disease: Secondary | ICD-10-CM | POA: Diagnosis not present

## 2020-07-31 DIAGNOSIS — Z79899 Other long term (current) drug therapy: Secondary | ICD-10-CM

## 2020-07-31 DIAGNOSIS — Z87891 Personal history of nicotine dependence: Secondary | ICD-10-CM

## 2020-07-31 DIAGNOSIS — Z7982 Long term (current) use of aspirin: Secondary | ICD-10-CM

## 2020-07-31 DIAGNOSIS — E039 Hypothyroidism, unspecified: Secondary | ICD-10-CM | POA: Diagnosis present

## 2020-07-31 DIAGNOSIS — D631 Anemia in chronic kidney disease: Secondary | ICD-10-CM | POA: Diagnosis present

## 2020-07-31 DIAGNOSIS — I10 Essential (primary) hypertension: Secondary | ICD-10-CM | POA: Diagnosis present

## 2020-07-31 DIAGNOSIS — J439 Emphysema, unspecified: Secondary | ICD-10-CM | POA: Diagnosis present

## 2020-07-31 DIAGNOSIS — Z823 Family history of stroke: Secondary | ICD-10-CM

## 2020-07-31 DIAGNOSIS — E161 Other hypoglycemia: Secondary | ICD-10-CM | POA: Diagnosis not present

## 2020-07-31 DIAGNOSIS — R4189 Other symptoms and signs involving cognitive functions and awareness: Secondary | ICD-10-CM | POA: Diagnosis present

## 2020-07-31 DIAGNOSIS — Z9981 Dependence on supplemental oxygen: Secondary | ICD-10-CM | POA: Diagnosis not present

## 2020-07-31 DIAGNOSIS — U071 COVID-19: Principal | ICD-10-CM | POA: Diagnosis present

## 2020-07-31 DIAGNOSIS — E162 Hypoglycemia, unspecified: Secondary | ICD-10-CM | POA: Diagnosis not present

## 2020-07-31 DIAGNOSIS — R262 Difficulty in walking, not elsewhere classified: Secondary | ICD-10-CM | POA: Diagnosis not present

## 2020-07-31 DIAGNOSIS — I708 Atherosclerosis of other arteries: Secondary | ICD-10-CM | POA: Diagnosis not present

## 2020-07-31 DIAGNOSIS — E785 Hyperlipidemia, unspecified: Secondary | ICD-10-CM | POA: Diagnosis present

## 2020-07-31 DIAGNOSIS — Z8249 Family history of ischemic heart disease and other diseases of the circulatory system: Secondary | ICD-10-CM | POA: Diagnosis not present

## 2020-07-31 DIAGNOSIS — N183 Chronic kidney disease, stage 3 unspecified: Secondary | ICD-10-CM | POA: Diagnosis present

## 2020-07-31 DIAGNOSIS — R279 Unspecified lack of coordination: Secondary | ICD-10-CM | POA: Diagnosis not present

## 2020-07-31 DIAGNOSIS — R0602 Shortness of breath: Secondary | ICD-10-CM | POA: Diagnosis not present

## 2020-07-31 DIAGNOSIS — R059 Cough, unspecified: Secondary | ICD-10-CM | POA: Diagnosis not present

## 2020-07-31 DIAGNOSIS — Z743 Need for continuous supervision: Secondary | ICD-10-CM | POA: Diagnosis not present

## 2020-07-31 DIAGNOSIS — I739 Peripheral vascular disease, unspecified: Secondary | ICD-10-CM | POA: Diagnosis not present

## 2020-07-31 DIAGNOSIS — R6889 Other general symptoms and signs: Secondary | ICD-10-CM | POA: Diagnosis not present

## 2020-07-31 LAB — CBC
HCT: 32.8 % — ABNORMAL LOW (ref 36.0–46.0)
Hemoglobin: 10.4 g/dL — ABNORMAL LOW (ref 12.0–15.0)
MCH: 30.5 pg (ref 26.0–34.0)
MCHC: 31.7 g/dL (ref 30.0–36.0)
MCV: 96.2 fL (ref 80.0–100.0)
Platelets: 175 10*3/uL (ref 150–400)
RBC: 3.41 MIL/uL — ABNORMAL LOW (ref 3.87–5.11)
RDW: 14.8 % (ref 11.5–15.5)
WBC: 6.8 10*3/uL (ref 4.0–10.5)
nRBC: 0 % (ref 0.0–0.2)

## 2020-07-31 LAB — COMPREHENSIVE METABOLIC PANEL
ALT: 18 U/L (ref 0–44)
AST: 23 U/L (ref 15–41)
Albumin: 3.8 g/dL (ref 3.5–5.0)
Alkaline Phosphatase: 63 U/L (ref 38–126)
Anion gap: 12 (ref 5–15)
BUN: 26 mg/dL — ABNORMAL HIGH (ref 8–23)
CO2: 32 mmol/L (ref 22–32)
Calcium: 9 mg/dL (ref 8.9–10.3)
Chloride: 94 mmol/L — ABNORMAL LOW (ref 98–111)
Creatinine, Ser: 1.26 mg/dL — ABNORMAL HIGH (ref 0.44–1.00)
GFR, Estimated: 44 mL/min — ABNORMAL LOW (ref >60)
Glucose, Bld: 50 mg/dL — ABNORMAL LOW (ref 70–99)
Potassium: 4.3 mmol/L (ref 3.5–5.1)
Sodium: 138 mmol/L (ref 135–145)
Total Bilirubin: 0.6 mg/dL (ref 0.3–1.2)
Total Protein: 7.5 g/dL (ref 6.5–8.1)

## 2020-07-31 LAB — TROPONIN I (HIGH SENSITIVITY)
Troponin I (High Sensitivity): 8 ng/L (ref <18)
Troponin I (High Sensitivity): 9 ng/L (ref <18)

## 2020-07-31 LAB — HEMOGLOBIN A1C
Hgb A1c MFr Bld: 6 % — ABNORMAL HIGH (ref 4.8–5.6)
Mean Plasma Glucose: 125.5 mg/dL

## 2020-07-31 LAB — URINALYSIS, COMPLETE (UACMP) WITH MICROSCOPIC
Bilirubin Urine: NEGATIVE
Glucose, UA: 150 mg/dL — AB
Hgb urine dipstick: NEGATIVE
Ketones, ur: NEGATIVE mg/dL
Leukocytes,Ua: NEGATIVE
Nitrite: NEGATIVE
Protein, ur: 30 mg/dL — AB
Specific Gravity, Urine: 1.013 (ref 1.005–1.030)
pH: 8 (ref 5.0–8.0)

## 2020-07-31 LAB — RESP PANEL BY RT-PCR (FLU A&B, COVID) ARPGX2
Influenza A by PCR: NEGATIVE
Influenza B by PCR: NEGATIVE
SARS Coronavirus 2 by RT PCR: POSITIVE — AB

## 2020-07-31 LAB — CBG MONITORING, ED: Glucose-Capillary: 145 mg/dL — ABNORMAL HIGH (ref 70–99)

## 2020-07-31 LAB — GLUCOSE, CAPILLARY: Glucose-Capillary: 94 mg/dL (ref 70–99)

## 2020-07-31 LAB — CK: Total CK: 90 U/L (ref 38–234)

## 2020-07-31 MED ORDER — DEXTROSE 50 % IV SOLN
25.0000 g | Freq: Once | INTRAVENOUS | Status: AC
  Administered 2020-07-31: 25 g via INTRAVENOUS
  Filled 2020-07-31: qty 50

## 2020-07-31 MED ORDER — ASPIRIN EC 81 MG PO TBEC
81.0000 mg | DELAYED_RELEASE_TABLET | Freq: Every day | ORAL | Status: DC
  Administered 2020-08-01 – 2020-08-02 (×2): 81 mg via ORAL
  Filled 2020-07-31 (×2): qty 1

## 2020-07-31 MED ORDER — ALBUTEROL SULFATE HFA 108 (90 BASE) MCG/ACT IN AERS
2.0000 | INHALATION_SPRAY | RESPIRATORY_TRACT | Status: DC | PRN
  Filled 2020-07-31: qty 6.7

## 2020-07-31 MED ORDER — ACETAMINOPHEN 325 MG PO TABS
650.0000 mg | ORAL_TABLET | Freq: Four times a day (QID) | ORAL | Status: DC | PRN
  Filled 2020-07-31: qty 2

## 2020-07-31 MED ORDER — SODIUM CHLORIDE 0.9 % IV SOLN
100.0000 mg | Freq: Every day | INTRAVENOUS | Status: DC
  Filled 2020-07-31: qty 20

## 2020-07-31 MED ORDER — PANTOPRAZOLE SODIUM 40 MG PO TBEC
40.0000 mg | DELAYED_RELEASE_TABLET | Freq: Every day | ORAL | Status: DC
  Administered 2020-08-01 – 2020-08-02 (×2): 40 mg via ORAL
  Filled 2020-07-31 (×2): qty 1

## 2020-07-31 MED ORDER — DONEPEZIL HCL 5 MG PO TABS
5.0000 mg | ORAL_TABLET | Freq: Every day | ORAL | Status: DC
  Administered 2020-07-31 – 2020-08-01 (×2): 5 mg via ORAL
  Filled 2020-07-31 (×2): qty 1

## 2020-07-31 MED ORDER — ENOXAPARIN SODIUM 40 MG/0.4ML ~~LOC~~ SOLN
40.0000 mg | Freq: Every day | SUBCUTANEOUS | Status: DC
  Administered 2020-07-31 – 2020-08-01 (×2): 40 mg via SUBCUTANEOUS
  Filled 2020-07-31 (×2): qty 0.4

## 2020-07-31 MED ORDER — SODIUM CHLORIDE 0.9% FLUSH: 3.0000 mL | INTRAVENOUS | Status: DC | PRN

## 2020-07-31 MED ORDER — ALBUTEROL SULFATE HFA 108 (90 BASE) MCG/ACT IN AERS: 2.0000 | INHALATION_SPRAY | RESPIRATORY_TRACT | Status: DC | PRN

## 2020-07-31 MED ORDER — SODIUM CHLORIDE 0.9 % IV SOLN
200.0000 mg | Freq: Once | INTRAVENOUS | Status: DC
  Filled 2020-07-31: qty 40

## 2020-07-31 MED ORDER — NITROGLYCERIN 0.4 MG SL SUBL: 0.4000 mg | SUBLINGUAL_TABLET | SUBLINGUAL | Status: DC | PRN

## 2020-07-31 MED ORDER — AMLODIPINE BESYLATE 5 MG PO TABS
5.0000 mg | ORAL_TABLET | Freq: Every day | ORAL | Status: DC
  Administered 2020-07-31 – 2020-08-02 (×3): 5 mg via ORAL
  Filled 2020-07-31 (×3): qty 1

## 2020-07-31 MED ORDER — SODIUM CHLORIDE 0.9% FLUSH
3.0000 mL | Freq: Two times a day (BID) | INTRAVENOUS | Status: DC
  Administered 2020-07-31 – 2020-08-02 (×3): 3 mL via INTRAVENOUS

## 2020-07-31 MED ORDER — SODIUM CHLORIDE 0.9 % IV BOLUS
1000.0000 mL | Freq: Once | INTRAVENOUS | Status: AC
  Administered 2020-07-31: 1000 mL via INTRAVENOUS

## 2020-07-31 MED ORDER — ZINC SULFATE 220 (50 ZN) MG PO CAPS
220.0000 mg | ORAL_CAPSULE | Freq: Every day | ORAL | Status: DC
  Administered 2020-08-01 – 2020-08-02 (×2): 220 mg via ORAL
  Filled 2020-07-31 (×2): qty 1

## 2020-07-31 MED ORDER — GABAPENTIN 600 MG PO TABS
300.0000 mg | ORAL_TABLET | Freq: Two times a day (BID) | ORAL | Status: DC
  Administered 2020-07-31 – 2020-08-02 (×4): 300 mg via ORAL
  Filled 2020-07-31 (×4): qty 1

## 2020-07-31 MED ORDER — ONDANSETRON HCL 4 MG/2ML IJ SOLN: 4.0000 mg | Freq: Four times a day (QID) | INTRAMUSCULAR | Status: DC | PRN

## 2020-07-31 MED ORDER — MAGNESIUM OXIDE -MG SUPPLEMENT 400 (240 MG) MG PO TABS
400.0000 mg | ORAL_TABLET | Freq: Two times a day (BID) | ORAL | Status: DC
  Administered 2020-07-31 – 2020-08-02 (×4): 400 mg via ORAL
  Filled 2020-07-31 (×4): qty 1

## 2020-07-31 MED ORDER — ADULT MULTIVITAMIN W/MINERALS CH
0.5000 | ORAL_TABLET | Freq: Two times a day (BID) | ORAL | Status: DC
  Administered 2020-07-31 – 2020-08-02 (×4): 0.5 via ORAL
  Filled 2020-07-31 (×4): qty 1

## 2020-07-31 MED ORDER — ONDANSETRON HCL 4 MG PO TABS: 4.0000 mg | ORAL_TABLET | Freq: Four times a day (QID) | ORAL | Status: DC | PRN

## 2020-07-31 MED ORDER — SODIUM CHLORIDE 0.9 % IV SOLN
100.0000 mg | Freq: Every day | INTRAVENOUS | Status: DC
  Administered 2020-08-01 – 2020-08-02 (×2): 100 mg via INTRAVENOUS
  Filled 2020-07-31 (×3): qty 20
  Filled 2020-07-31: qty 100

## 2020-07-31 MED ORDER — SODIUM CHLORIDE 0.9 % IV SOLN
200.0000 mg | Freq: Once | INTRAVENOUS | Status: AC
  Administered 2020-07-31: 23:00:00 200 mg via INTRAVENOUS
  Filled 2020-07-31: qty 40

## 2020-07-31 MED ORDER — METOPROLOL SUCCINATE ER 50 MG PO TB24
100.0000 mg | ORAL_TABLET | Freq: Every day | ORAL | Status: DC
  Administered 2020-08-01: 11:00:00 100 mg via ORAL
  Filled 2020-07-31 (×2): qty 2

## 2020-07-31 MED ORDER — VITAMIN D 25 MCG (1000 UNIT) PO TABS
1000.0000 [IU] | ORAL_TABLET | Freq: Every day | ORAL | Status: DC
  Administered 2020-08-01 – 2020-08-02 (×2): 1000 [IU] via ORAL
  Filled 2020-07-31 (×2): qty 1

## 2020-07-31 MED ORDER — SODIUM CHLORIDE 0.9 % IV SOLN: 250.0000 mL | INTRAVENOUS | Status: DC | PRN

## 2020-07-31 MED ORDER — ATORVASTATIN CALCIUM 20 MG PO TABS
40.0000 mg | ORAL_TABLET | Freq: Every day | ORAL | Status: DC
  Administered 2020-08-01 – 2020-08-02 (×2): 40 mg via ORAL
  Filled 2020-07-31 (×2): qty 2

## 2020-07-31 MED ORDER — CLOPIDOGREL BISULFATE 75 MG PO TABS
75.0000 mg | ORAL_TABLET | Freq: Every day | ORAL | Status: DC
  Administered 2020-08-01 – 2020-08-02 (×2): 75 mg via ORAL
  Filled 2020-07-31 (×2): qty 1

## 2020-07-31 MED ORDER — GUAIFENESIN-DM 100-10 MG/5ML PO SYRP
10.0000 mL | ORAL_SOLUTION | ORAL | Status: DC | PRN
  Administered 2020-08-02: 10:00:00 10 mL via ORAL
  Filled 2020-07-31: qty 10

## 2020-07-31 MED ORDER — ISOSORBIDE MONONITRATE ER 60 MG PO TB24
60.0000 mg | ORAL_TABLET | Freq: Every day | ORAL | Status: DC
  Administered 2020-08-01 – 2020-08-02 (×2): 60 mg via ORAL
  Filled 2020-07-31 (×2): qty 1

## 2020-07-31 NOTE — H&P (Addendum)
History and Physical    [name] [name] ZDG:[id number] DOB: July 15, 1940 DOA: 07/31/2020  PCP: Jerrol Banana., MD   Patient coming from: Home  I have personally briefly reviewed patient's old medical records in [place]  Chief Complaint: Weakness  HPI: [name] [name] is a 80 y.o. female with medical history significant for COPD with chronic respiratory failure on 2 L of oxygen, coronary artery disease, diabetes mellitus, hypertension and dyslipidemia who was brought into the ER by EMS for evaluation of generalized weakness.  According to the patient she has been weak for about a month but over the last couple of days has been unable to walk.  She initially required a 1 person assist, and her husband was able to help but in the last 2 days she has been unable to get out of bed.  She complains of shortness of breath worse from her baseline as well as an occasional cough but denies having any fever, no chills, no abdominal pain, no nausea, no vomiting, no diarrhea, no urinary frequency, no nocturia, no dysuria, no headache, no anorexia or blurred vision. Labs show sodium 138, potassium 4.3, chloride 94, bicarb 32, glucose 50, BUN 26, creatinine 1.26, calcium 9.0, alkaline phosphatase 63, albumin 3.8, AST 23, ALT 18, total protein 7.5, total bilirubin 0.6, total CK 90, troponin 9, white count 6.8, hemoglobin 10.4, hematocrit 32.8, MCV 96.2, RDW 14.8, platelet count 175 Patient SARS coronavirus 2 point-of-care test is positive. CT scan of the head without contrast showed stable atrophy with periventricular small vessel disease.  Prior small infarct medial right thalamus.  No acute infarct evident.  No mass-effect or hemorrhage.  There are foci of arterial vascular calcification.  There are foci of paranasal sinus disease at multiple sites as well as mild inferior left mastoid air cell disease. Chest x-ray reviewed by me shows underlying emphysematous change, better delineated on CT. No edema  or airspace opacity. Heart size normal. Aortic atherosclerosis. Twelve-lead EKG reviewed by me shows sinus rhythm with PACs.   ED Course: Patient is a 80 year old female with a history of diabetes mellitus with complications of stage III chronic kidney disease, COPD with chronic respiratory failure, coronary artery disease and hypertension was brought into the ER by EMS for evaluation of generalized weakness and difficulty with ambulation.  Patient was initially hypoglycemic upon arrival with blood sugar of 50, that improved following administration of D50.  Patient's point-of-care COVID test is positive but she does not have an increased oxygen requirement and chest x-ray shows chronic changes.  She will be admitted to the hospital for further evaluation.   Review of Systems: As per HPI otherwise all other systems reviewed and negative.    Past Medical History:  Diagnosis Date  . Abnormal CT lung screening 10/17/2015  . COPD (chronic obstructive pulmonary disease) ([place])   . Coronary artery disease, non-occlusive    a. cath 2006: min nonobs CAD; b. cath 12/2010: cath LAD 50%, RCA 60%; c. 08/2013: Minimal luminal irregs, right dominant system with no significant CAD, diffuse luminal irregs noted. Normal EF 55%, no AS or MS.   . Diabetes mellitus   . Hyperlipemia    Followed by Dr. Rosanna Randy  . Hypertension   . Lung cancer ([place])   . Macular degeneration    rt  . Personal history of tobacco use, presenting hazards to health 10/15/2015  . Stroke [facility])     Past Surgical History:  Procedure Laterality Date  . ABDOMINAL HYSTERECTOMY    .  ANTERIOR CERVICAL DECOMP/DISCECTOMY FUSION N/A 10/19/2013   Procedure: CERVICAL FIVE-SIX ANTERIOR CERVICAL DECOMPRESSION WITH FUSION INTERBODY PROSTHESIS PLATING AND PEEK CAGE;  Surgeon: Ophelia Charter, MD;  Location: [place] [facility];  Service: Neurosurgery;  Laterality: N/A;  . BACK SURGERY  80's  . BREAST CYST EXCISION Left    left negative   . CARDIAC  CATHETERIZATION  05/2004  . CATARACT EXTRACTION Left   . CHOLECYSTECTOMY    . COLONOSCOPY N/A 12/29/2018   Procedure: COLONOSCOPY;  Surgeon: Toledo, Benay Pike, MD;  Location: [facility] ENDOSCOPY;  Service: Gastroenterology;  Laterality: N/A;  . ENDARTERECTOMY Left 10/24/2014   Procedure: ENDARTERECTOMY CAROTID;  Surgeon: Algernon Huxley, MD;  Location: [facility] ORS;  Service: Vascular;  Laterality: Left;  . ENDOBRONCHIAL ULTRASOUND N/A 11/14/2015   Procedure: ENDOBRONCHIAL ULTRASOUND;  Surgeon: [name], MD;  Location: [facility] ORS;  Service: Cardiopulmonary;  Laterality: N/A;  . PERIPHERAL VASCULAR CATHETERIZATION N/A 12/04/2015   Procedure: Glori Luis Cath Insertion;  Surgeon: Algernon Huxley, MD;  Location: [place] CV LAB;  Service: Cardiovascular;  Laterality: N/A;  . PORTA CATH REMOVAL N/A 05/17/2017   Procedure: PORTA CATH REMOVAL;  Surgeon: Algernon Huxley, MD;  Location: [place] CV LAB;  Service: Cardiovascular;  Laterality: N/A;  . TONSILLECTOMY AND ADENOIDECTOMY    . VESICOVAGINAL FISTULA CLOSURE W/ TAH       reports that she quit smoking about 4 years ago. Her smoking use included cigarettes. She has a 50.00 pack-year smoking history. She has never used smokeless tobacco. She reports that she does not drink alcohol and does not use drugs.  Allergies  Allergen Reactions  . Coconut Fatty Acids Swelling and Other (See Comments)    Throat swells    Family History  Problem Relation Age of Onset  . Stroke Mother   . Heart attack Father   . Heart attack Brother   . Heart attack Brother   . Lung cancer Maternal Grandfather   . Heart attack Paternal Grandmother        MI      Prior to Admission medications   Medication Sig Start Date End Date Taking? Authorizing Provider  Accu-Chek FastClix Lancets MISC Check blood sugar up to 4 times daily. 06/20/20   Jerrol Banana., MD  albuterol (VENTOLIN HFA) 108 [phone] Base) MCG/ACT inhaler Inhale 2 puffs into the lungs every 4 (four) hours as needed  for wheezing or shortness of breath. 09/26/18   Jerrol Banana., MD  amLODipine (NORVASC) 5 MG tablet TAKE 1 TABLET BY MOUTH EVERY DAY 04/19/20   Jerrol Banana., MD  aspirin EC 81 MG EC tablet Take 1 tablet (81 mg total) by mouth daily. 05/08/18   Mayo, Pete Pelt, MD  atorvastatin (LIPITOR) 40 MG tablet TAKE 1 TABLET BY MOUTH  DAILY AT 6 PM 04/18/20   Jerrol Banana., MD  Blood Glucose Monitoring Suppl (ACCU-CHEK GUIDE) w/Device KIT USE AS DIRECTED 08/16/19   Jerrol Banana., MD  clopidogrel (PLAVIX) 75 MG tablet TAKE 1 TABLET BY MOUTH  DAILY 03/20/20   Jerrol Banana., MD  donepezil (ARICEPT) 5 MG tablet TAKE 1 TABLET BY MOUTH EVERYDAY AT BEDTIME 03/14/20   Jerrol Banana., MD  gabapentin (NEURONTIN) 600 MG tablet TAKE ONE-HALF TABLET BY  MOUTH TWICE DAILY 03/20/20   Jerrol Banana., MD  glimepiride (AMARYL) 2 MG tablet TAKE 1 TABLET BY MOUTH  TWICE DAILY 03/20/20   Jerrol Banana.,  MD  glucose blood (ACCU-CHEK GUIDE) test strip CHECK BLOOD SUGAR ONCE  DAILY 06/19/20   Jerrol Banana., MD  ipratropium-albuterol (DUONEB) 0.5-2.5 (3) MG/3ML SOLN Take 3 mLs by nebulization every 4 (four) hours as needed. 06/06/19   Trinna Post, PA-C  isosorbide mononitrate (IMDUR) 60 MG 24 hr tablet TAKE 1 TABLET BY MOUTH  DAILY 04/21/20   Jerrol Banana., MD  LORazepam (ATIVAN) 1 MG tablet Take 1 tablet (1 mg total) by mouth 2 (two) times daily as needed for anxiety. Patient not taking: Reported on 07/12/2020 05/24/17   Jerrol Banana., MD  magnesium oxide (MAG-OX) 400 (241.3 MG) MG tablet Take 400 mg by mouth 2 (two) times daily.  08/04/13   [provider]  metoprolol succinate (TOPROL-XL) 100 MG 24 hr tablet Take 1 tablet (100 mg total) by mouth daily. 08/16/19   Jerrol Banana., MD  mometasone (ELOCON) 0.1 % lotion Apply 4 drops topically at bedtime as needed (dry skin). Patient not taking: Reported on 07/12/2020 07/07/19   Jerrol Banana., MD  Multiple Vitamin (MULTIVITAMIN) tablet Take 0.5 tablets by mouth 2 (two) times daily.    [provider]  Multiple Vitamins-Minerals (ICAPS) CAPS Take 1 capsule by mouth 2 (two) times daily.     [provider]  nitroGLYCERIN (NITROSTAT) 0.4 MG SL tablet PLACE 1 TABLET UNDER THE TONGUE EVERY 5 MINUTES AS NEEDED FOR CHEST PAIN 01/02/20   Jerrol Banana., MD  pantoprazole (PROTONIX) 40 MG tablet TAKE 1 TABLET BY MOUTH  DAILY 06/17/20   Jerrol Banana., MD  protective barrier (RESTORE) CREA Apple twice daily. 07/12/20   Trinna Post, PA-C  VITAMIN D PO Take by mouth daily.     [provider]  vitamin E 180 MG (400 UNITS) capsule Take 400 Units by mouth daily.    [provider]  Wound Dressings (COMFEEL PLUS ULCER DRESSING) PADS Apply once daily as needed. 07/12/20   Trinna Post, PA-C  zinc sulfate 220 (50 Zn) MG capsule Take 220 mg by mouth daily.    [provider]    Physical Exam: Vitals:   07/31/20 1535 07/31/20 1545 07/31/20 1600 07/31/20 1615  BP: 136/65  140/61   Pulse: 67 65 64 65  Resp: (!) '22 20 19 17  ' Temp:      TempSrc:      SpO2: 95% 98% 95% 95%  Weight:      Height:         Vitals:   07/31/20 1535 07/31/20 1545 07/31/20 1600 07/31/20 1615  BP: 136/65  140/61   Pulse: 67 65 64 65  Resp: (!) '22 20 19 17  ' Temp:      TempSrc:      SpO2: 95% 98% 95% 95%  Weight:      Height:          Constitutional: Alert and oriented x 3. Not in any apparent distress.  Chronically ill-appearing HEENT:      Head: Normocephalic and atraumatic.         Eyes: PERLA, EOMI, Conjunctivae are normal. Sclera is non-icteric.       Mouth/Throat: Mucous membranes are moist.       Neck: Supple with no signs of meningismus. Cardiovascular: Regular rate and rhythm. No murmurs, gallops, or rubs. 2+ symmetrical distal pulses are present . No JVD. No LE edema Respiratory: Respiratory effort normal .  Bilateral air  entry.  Scattered wheezes, no crackles, or rhonchi.  Gastrointestinal: Soft, non tender, and non distended with positive bowel sounds.  Genitourinary: No CVA tenderness. Musculoskeletal: Nontender with normal range of motion in all extremities No cyanosis, or erythema of extremities. Neurologic:  Face is symmetric. Moving all extremities. No gross focal neurologic deficits  Skin: Skin is warm, dry.  No rash or ulcers Psychiatric: Mood and affect are normal   Labs on Admission: I have personally reviewed following labs and imaging studies  CBC: Recent Labs  Lab 07/31/20 1022  WBC 6.8  HGB 10.4*  HCT 32.8*  MCV 96.2  PLT 876   Basic Metabolic Panel: Recent Labs  Lab 07/31/20 1022  NA 138  K 4.3  CL 94*  CO2 32  GLUCOSE 50*  BUN 26*  CREATININE 1.26*  CALCIUM 9.0   GFR: Estimated Creatinine Clearance: 33.1 mL/min (A) (by C-G formula based on SCr of 1.26 mg/dL (H)). Liver Function Tests: Recent Labs  Lab 07/31/20 1022  AST 23  ALT 18  ALKPHOS 63  BILITOT 0.6  PROT 7.5  ALBUMIN 3.8   No results for input(s): LIPASE, AMYLASE in the last 168 hours. No results for input(s): AMMONIA in the last 168 hours. Coagulation Profile: No results for input(s): INR, PROTIME in the last 168 hours. Cardiac Enzymes: Recent Labs  Lab 07/31/20 1302  CKTOTAL 90   BNP (last 3 results) No results for input(s): PROBNP in the last 8760 hours. HbA1C: No results for input(s): HGBA1C in the last 72 hours. CBG: Recent Labs  Lab 07/31/20 1247  GLUCAP 145*   Lipid Profile: No results for input(s): CHOL, HDL, LDLCALC, TRIG, CHOLHDL, LDLDIRECT in the last 72 hours. Thyroid Function Tests: No results for input(s): TSH, T4TOTAL, FREET4, T3FREE, THYROIDAB in the last 72 hours. Anemia Panel: No results for input(s): VITAMINB12, FOLATE, FERRITIN, TIBC, IRON, RETICCTPCT in the last 72 hours. Urine analysis:    Component Value Date/Time   COLORURINE YELLOW (A) 07/31/2020 1302    APPEARANCEUR CLOUDY (A) 07/31/2020 1302   APPEARANCEUR Clear 04/20/2019 0000   LABSPEC 1.013 07/31/2020 1302   PHURINE 8.0 07/31/2020 1302   GLUCOSEU 150 (A) 07/31/2020 1302   HGBUR NEGATIVE 07/31/2020 1302   BILIRUBINUR NEGATIVE 07/31/2020 1302   BILIRUBINUR Negative 09/12/2019 1559   KETONESUR NEGATIVE 07/31/2020 1302   PROTEINUR 30 (A) 07/31/2020 1302   UROBILINOGEN 0.2 09/12/2019 1559   NITRITE NEGATIVE 07/31/2020 1302   LEUKOCYTESUR NEGATIVE 07/31/2020 1302    Radiological Exams on Admission: DG Chest 2 View  Result Date: 07/31/2020 CLINICAL DATA:  Shortness of breath EXAM: CHEST - 2 VIEW COMPARISON:  June 06, 2019 chest radiograph and chest CT December 25, 2019 FINDINGS: Underlying emphysematous change, better delineated on prior CT. There is currently no edema or airspace opacity. Heart size and pulmonary vascularity are normal. No adenopathy. There is aortic atherosclerosis. There is postop change in the lower cervical region. IMPRESSION: Underlying emphysematous change, better delineated on CT. No edema or airspace opacity. Heart size normal. Aortic atherosclerosis. Aortic Atherosclerosis (ICD10-I70.0) and Emphysema (ICD10-J43.9). Electronically Signed   By: Lowella Grip III M.D.   On: 07/31/2020 11:11   CT Head Wo Contrast  Result Date: 07/31/2020 CLINICAL DATA:  Difficulty with ambulation EXAM: CT HEAD WITHOUT CONTRAST TECHNIQUE: Contiguous axial images were obtained from the base of the skull through the vertex without intravenous contrast. COMPARISON:  Brain MRI May 06, 2018; head CT March 30, 2017. FINDINGS: Brain: Moderate diffuse atrophy is stable. There is  no intracranial mass, hemorrhage, extra-axial fluid collection, or midline shift. There is evidence of a prior small infarct in the medial right thalamus, stable. Decreased attenuation throughout much of the centra semiovale bilaterally is stable. No acute appearing infarct evident. Vascular: No hyperdense  vessel. There is calcification in each carotid siphon region. Skull: Bony calvarium appears intact. Sinuses/Orbits: There is mucosal thickening and opacification in multiple ethmoid air cells. Small air-fluid level in each sphenoid sinus. There is mucosal thickening in the inferior left frontal sinus. Visualized orbits appear symmetric bilaterally. Other: Minimal opacification of several inferior left mastoid air cells. Visualized right-sided mastoid air cells are clear. IMPRESSION: Stable atrophy with periventricular small vessel disease. Prior small infarct medial right thalamus. No acute infarct evident. No mass or hemorrhage. There are foci of arterial vascular calcification. There are foci of paranasal sinus disease at multiple sites as well as mild inferior left mastoid air cell disease. Electronically Signed   By: Lowella Grip III M.D.   On: 07/31/2020 11:50     Assessment/Plan Principal Problem:   Generalized weakness Active Problems:   HYPERTENSION, BENIGN   COPD (chronic obstructive pulmonary disease) with emphysema (HCC)   CAD (coronary artery disease)   Adult hypothyroidism   COVID-19 virus detected   Type 2 diabetes mellitus with stage 3 chronic kidney disease (HCC)   History of anemia due to CKD   Generalized weakness Unclear etiology may be related to acute COVID 19 viral illness We will place patient on fall precautions We will request PT evaluation     COVID-19 virus detected Patient's point-of-care SARS coronavirus 2 test is positive but she is asymptomatic. She is not hypoxic and chest x-ray does not show any acute findings. Ideally patient would benefit from monoclonal antibody but that is not offered inpatient Patient received 2 doses of the COVID-19 vaccine as well as a booster dose. We will place patient on remdesivir per protocol for 3 days No indication for systemic steroids at this time Supportive care with antitussives and bronchodilator therapy as  needed Will monitor inflammatory markers     Diabetes mellitus with complications of stage III chronic kidney disease and hyperglycemia Patient had a blood sugar of 50g/dl upon arrival to the ER that improved following administration of D50 Place patient on a consistent carbohydrate diet Blood sugar checks every 4 hours Hold oral hypoglycemic agents for now     COPD with chronic respiratory failure Not acutely exacerbated Patient wears 2 L of oxygen continuous Continue oxygen supplementation at 2 L to keep pulse oximetry greater than 92%    History of coronary artery disease Continue beta-blockers, nitrates, statins, aspirin and Plavix    Mild cognitive deficit Continue donepezil    DVT prophylaxis: Lovenox Code Status: full code Family Communication: Called and left voicemail for patient's husband, Karlen Barbar.  Awaiting callback Disposition Plan: Back to previous home environment Consults called: Physical therapy Status: At the time of admission, it appears that the appropriate admission status for this patient is inpatient. This is judged to be reasonable and necessary in order to provide the required intensity of service to ensure the patient's safety given the presenting symptoms, physical exam findings and initial radiographic and laboratory data in the context of their comorbid conditions. Patient requires inpatient status due to high intensity of service, high risk of further deterioration and high frequency of surveillance required.    Collier Bullock MD [facility]     07/31/2020, 4:35 PM

## 2020-07-31 NOTE — ED Provider Notes (Signed)
[facility] Emergency Department Provider Note  Time seen: 10:41 AM  I have reviewed the triage vital signs and the nursing notes.   HISTORY  Chief Complaint Weakness   HPI [name] [name] is a 80 y.o. female with a past medical history of COPD, CAD, diabetes, hyperlipidemia, hypertension, presents to the emergency department for generalized weakness.  According to the patient over the past month or so she has been feeling progressively more weak but she is still been able to walk with the use of her cane.  For the past 2 to 3 days she has not been able to get out of bed even with assistance due to generalized weakness.  Denies any focal weakness states weakness throughout her entire body.  Denies any fever, cough, vomiting or diarrhea.  Denies any headache.   Past Medical History:  Diagnosis Date  . Abnormal CT lung screening 10/17/2015  . COPD (chronic obstructive pulmonary disease) ([place])   . Coronary artery disease, non-occlusive    a. cath 2006: min nonobs CAD; b. cath 12/2010: cath LAD 50%, RCA 60%; c. 08/2013: Minimal luminal irregs, right dominant system with no significant CAD, diffuse luminal irregs noted. Normal EF 55%, no AS or MS.   . Diabetes mellitus   . Hyperlipemia    Followed by Dr. Rosanna Randy  . Hypertension   . Lung cancer ([place])   . Macular degeneration    rt  . Personal history of tobacco use, presenting hazards to health 10/15/2015  . Stroke [facility])     Patient Active Problem List   Diagnosis Date Noted  . Neutropenia ([place]) 09/09/2019  . Drug-induced polyneuropathy ([place]) 09/09/2019  . Inflammatory spondylopathy of lumbosacral region ([place]) 09/09/2019  . COPD (chronic obstructive pulmonary disease) ([place]) 06/06/2019  . GI bleed 12/28/2018  . Syncope 12/27/2018  . Blood per rectum 12/27/2018  . Constipation 12/27/2018  . Chest pain 09/23/2018  . Stroke (cerebrum) ([place]) 05/06/2018  . Malnutrition of moderate degree 03/31/2017  . Acute  respiratory failure with hypoxia ([place]) 03/30/2017  . AKI (acute kidney injury) ([place]) 07/15/2016  . Protein-calorie malnutrition, severe 02/08/2016  . Primary cancer of right upper lobe of lung ([place]) 11/20/2015  . Abnormal CT lung screening 10/17/2015  . Personal history of tobacco use, presenting hazards to health 10/15/2015  . Chronic vulvitis 09/26/2014  . Allergic reaction 09/26/2014  . Carotid stenosis 08/30/2014  . Cervical nerve root disorder 08/10/2014  . CAD in native artery 08/10/2014  . B12 deficiency 08/10/2014  . Back ache 08/10/2014  . Bronchitis, chronic ([place]) 08/10/2014  . Diabetes mellitus with polyneuropathy ([place]) 08/10/2014  . Can't get food down 08/10/2014  . Eczema of external ear 08/10/2014  . Accumulation of fluid in tissues 08/10/2014  . Gout 08/10/2014  . Adult hypothyroidism 08/10/2014  . Mononeuritis 08/10/2014  . Muscle ache 08/10/2014  . Disorder of peripheral nervous system 08/10/2014  . Lesion of vulva 08/10/2014  . Cervical spondylosis with radiculopathy 10/19/2013  . COPD exacerbation ([place]) 03/29/2013  . CAD (coronary artery disease) 06/22/2011  . COPD (chronic obstructive pulmonary disease) with emphysema ([place]) 03/07/2010  . CHEST PAIN UNSPECIFIED 07/22/2009  . HLD (hyperlipidemia) 04/01/2009  . Malaise and fatigue 04/01/2009  . TOBACCO ABUSE 01/18/2009  . HYPERTENSION, BENIGN 01/18/2009  . CLAUDICATION 01/18/2009  . Pain in limb 01/18/2009  . CAFL (chronic airflow limitation) ([place]) 01/20/2007  . Late effects of cerebrovascular disease 01/10/2007  . Essential (primary) hypertension 12/22/2006    Past Surgical History:  Procedure Laterality Date  . ABDOMINAL HYSTERECTOMY    . ANTERIOR CERVICAL DECOMP/DISCECTOMY FUSION N/A 10/19/2013   Procedure: CERVICAL FIVE-SIX ANTERIOR CERVICAL DECOMPRESSION WITH FUSION INTERBODY PROSTHESIS PLATING AND PEEK CAGE;  Surgeon: Ophelia Charter, MD;  Location: [place] [facility];  Service: Neurosurgery;  Laterality:  N/A;  . BACK SURGERY  80's  . BREAST CYST EXCISION Left    left negative   . CARDIAC CATHETERIZATION  05/2004  . CATARACT EXTRACTION Left   . CHOLECYSTECTOMY    . COLONOSCOPY N/A 12/29/2018   Procedure: COLONOSCOPY;  Surgeon: Toledo, Benay Pike, MD;  Location: [facility] ENDOSCOPY;  Service: Gastroenterology;  Laterality: N/A;  . ENDARTERECTOMY Left 10/24/2014   Procedure: ENDARTERECTOMY CAROTID;  Surgeon: Algernon Huxley, MD;  Location: [facility] ORS;  Service: Vascular;  Laterality: Left;  . ENDOBRONCHIAL ULTRASOUND N/A 11/14/2015   Procedure: ENDOBRONCHIAL ULTRASOUND;  Surgeon: [name], MD;  Location: [facility] ORS;  Service: Cardiopulmonary;  Laterality: N/A;  . PERIPHERAL VASCULAR CATHETERIZATION N/A 12/04/2015   Procedure: Glori Luis Cath Insertion;  Surgeon: Algernon Huxley, MD;  Location: [place] [facility];  Service: Cardiovascular;  Laterality: N/A;  . PORTA CATH REMOVAL N/A 05/17/2017   Procedure: PORTA CATH REMOVAL;  Surgeon: Algernon Huxley, MD;  Location: [place] CV LAB;  Service: Cardiovascular;  Laterality: N/A;  . TONSILLECTOMY AND ADENOIDECTOMY    . VESICOVAGINAL FISTULA CLOSURE W/ TAH      Prior to Admission medications   Medication Sig Start Date End Date Taking? Authorizing Provider  Accu-Chek FastClix Lancets MISC Check blood sugar up to 4 times daily. 06/20/20   Jerrol Banana., MD  albuterol (VENTOLIN HFA) 108 [phone] Base) MCG/ACT inhaler Inhale 2 puffs into the lungs every 4 (four) hours as needed for wheezing or shortness of breath. 09/26/18   Jerrol Banana., MD  amLODipine (NORVASC) 5 MG tablet TAKE 1 TABLET BY MOUTH EVERY DAY 04/19/20   Jerrol Banana., MD  aspirin EC 81 MG EC tablet Take 1 tablet (81 mg total) by mouth daily. 05/08/18   Mayo, Pete Pelt, MD  atorvastatin (LIPITOR) 40 MG tablet TAKE 1 TABLET BY MOUTH  DAILY AT 6 PM 04/18/20   Jerrol Banana., MD  Blood Glucose Monitoring Suppl (ACCU-CHEK GUIDE) w/Device KIT USE AS DIRECTED 08/16/19   Jerrol Banana., MD  clopidogrel (PLAVIX) 75 MG tablet TAKE 1 TABLET BY MOUTH  DAILY 03/20/20   Jerrol Banana., MD  donepezil (ARICEPT) 5 MG tablet TAKE 1 TABLET BY MOUTH EVERYDAY AT BEDTIME 03/14/20   Jerrol Banana., MD  gabapentin (NEURONTIN) 600 MG tablet TAKE ONE-HALF TABLET BY  MOUTH TWICE DAILY 03/20/20   Jerrol Banana., MD  glimepiride (AMARYL) 2 MG tablet TAKE 1 TABLET BY MOUTH  TWICE DAILY 03/20/20   Jerrol Banana., MD  glucose blood (ACCU-CHEK GUIDE) test strip CHECK BLOOD SUGAR ONCE  DAILY 06/19/20   Jerrol Banana., MD  ipratropium-albuterol (DUONEB) 0.5-2.5 (3) MG/3ML SOLN Take 3 mLs by nebulization every 4 (four) hours as needed. 06/06/19   Trinna Post, PA-C  isosorbide mononitrate (IMDUR) 60 MG 24 hr tablet TAKE 1 TABLET BY MOUTH  DAILY 04/21/20   Jerrol Banana., MD  LORazepam (ATIVAN) 1 MG tablet Take 1 tablet (1 mg total) by mouth 2 (two) times daily as needed for anxiety. Patient not taking: Reported on 07/12/2020 05/24/17   Jerrol Banana., MD  magnesium oxide (  MAG-OX) 400 (241.3 MG) MG tablet Take 400 mg by mouth 2 (two) times daily.  08/04/13   [provider]  metoprolol succinate (TOPROL-XL) 100 MG 24 hr tablet Take 1 tablet (100 mg total) by mouth daily. 08/16/19   Jerrol Banana., MD  mometasone (ELOCON) 0.1 % lotion Apply 4 drops topically at bedtime as needed (dry skin). Patient not taking: Reported on 07/12/2020 07/07/19   Jerrol Banana., MD  Multiple Vitamin (MULTIVITAMIN) tablet Take 0.5 tablets by mouth 2 (two) times daily.    [provider]  Multiple Vitamins-Minerals (ICAPS) CAPS Take 1 capsule by mouth 2 (two) times daily.     [provider]  nitroGLYCERIN (NITROSTAT) 0.4 MG SL tablet PLACE 1 TABLET UNDER THE TONGUE EVERY 5 MINUTES AS NEEDED FOR CHEST PAIN 01/02/20   Jerrol Banana., MD  pantoprazole (PROTONIX) 40 MG tablet TAKE 1 TABLET BY MOUTH  DAILY 06/17/20   Jerrol Banana., MD  protective barrier (RESTORE) CREA [place] twice daily. 07/12/20   Trinna Post, PA-C  VITAMIN D PO Take by mouth daily.     [provider]  vitamin E 180 MG (400 UNITS) capsule Take 400 Units by mouth daily.    [provider]  Wound Dressings (COMFEEL PLUS ULCER DRESSING) PADS Apply once daily as needed. 07/12/20   Trinna Post, PA-C  zinc sulfate 220 (50 Zn) MG capsule Take 220 mg by mouth daily.    [provider]    Allergies  Allergen Reactions  . Coconut Fatty Acids Swelling and Other (See Comments)    Throat swells    Family History  Problem Relation Age of Onset  . Stroke Mother   . Heart attack Father   . Heart attack Brother   . Heart attack Brother   . Lung cancer Maternal Grandfather   . Heart attack Paternal Grandmother        MI    Social History Social History   Tobacco Use  . Smoking status: Former Smoker    Packs/day: 1.00    Years: 50.00    Pack years: 50.00    Types: Cigarettes    Quit date: 10/03/2015    Years since quitting: 4.8  . Smokeless tobacco: Never Used  . Tobacco comment: smokes 3 cigs daily 05/06/15. Pt instructed to quit.  Vaping Use  . Vaping Use: Never used  Substance Use Topics  . Alcohol use: No    Alcohol/week: 0.0 standard drinks  . Drug use: No    Review of Systems Constitutional: Negative for fever.  Positive for generalized weakness Cardiovascular: Negative for chest pain. Respiratory: Negative for shortness of breath. Gastrointestinal: Negative for abdominal pain, vomiting and diarrhea. Genitourinary: Negative for urinary compaints Musculoskeletal: Negative for musculoskeletal complaints Skin: Negative for skin complaints  Neurological: Negative for headache All other ROS negative  ____________________________________________   PHYSICAL EXAM:  VITAL SIGNS: ED Triage Vitals  Enc Vitals Group     BP 07/31/20 1015 (!) 147/70     Pulse Rate 07/31/20 1015 76     Resp 07/31/20  1015 16     Temp 07/31/20 1015 98.5 F (36.9 C)     Temp Source 07/31/20 1015 Oral     SpO2 07/31/20 1013 100 %     Weight 07/31/20 1017 131 lb 8 oz (59.6 kg)     Height 07/31/20 1017 '5\' 5"'  (1.651 m)     Head Circumference --  Peak Flow --      Pain Score 07/31/20 1017 0     Pain Loc --      Pain Edu? --      Excl. in [place]? --     Constitutional: Patient is awake and alert, no acute distress. Eyes: Normal exam ENT      Head: Normocephalic and atraumatic.      Mouth/Throat: Somewhat dry appearing mucous membranes Cardiovascular: Normal rate, regular rhythm.  Respiratory: Normal respiratory effort without tachypnea nor retractions. Breath sounds are clear.  Satting 100% on 2 L nasal cannula. Gastrointestinal: Soft and nontender. No distention.  Musculoskeletal: Nontender with normal range of motion in all extremities. No lower extremity tenderness or edema. Neurologic:  Normal speech and language.  Patient has generalized weakness 3/5 strength in bilateral lower extremities 4/5 strength in bilateral upper extremities. Skin:  Skin is warm, dry and intact.  Psychiatric: Mood and affect are normal.   ____________________________________________    EKG  EKG viewed and interpreted by myself shows a normal sinus rhythm at 72 bpm with a narrow QRS, normal axis, normal intervals, nonspecific ST changes.  ____________________________________________    RADIOLOGY  CT scan does not appear to show any acute abnormality. Chest x-ray is negative for acute abnormality.  ____________________________________________   INITIAL IMPRESSION / ASSESSMENT AND PLAN / ED COURSE  Pertinent labs & imaging results that were available during my care of the patient were reviewed by me and considered in my medical decision making (see chart for details).   Patient presents to the emergency department for generalized weakness which has significantly worsened over the past 2 to 3 days with the  patient has no longer able to get out of bed.  Differential is quite broad would include infectious etiologies such as pneumonia or UTI, ACS, metabolic or electrolyte abnormality, significant dehydration.  We will check labs, IV hydrate and reassess.  Patient has a largely negative review of systems including no nausea vomiting diarrhea focal weakness headache cough.  Patient states she does wear 2 L nasal cannula oxygen chronically.  Currently satting 100% on 2 L.  Patient's work-up is overall reassuring.  Lab work urinalysis CT scan and chest x-ray did not appear to show any acute abnormality or cause for the patient's weakness.  Husband is here who states the patient does not typically walk but is able to stand on her own is able to pivot and is able to help get herself from the chair to the bed or bed to wheelchair wheelchair to toilet.  Today and yesterday states patient could not get up at all.  He states he is not able to lift her up as she is not assisting him in any way.  Given the patient's negative work-up however we will consult PT and social work for further evaluation.  [name] [name] was evaluated in Emergency Department on 07/31/2020 for the symptoms described in the history of present illness. She was evaluated in the context of the global COVID-19 pandemic, which necessitated consideration that the patient might be at risk for infection with the SARS-CoV-2 virus that causes COVID-19. Institutional protocols and algorithms that pertain to the evaluation of patients at risk for COVID-19 are in a state of rapid change based on information released by regulatory bodies including the CDC and federal and state organizations. These policies and algorithms were followed during the patient's care in the ED.  ____________________________________________   FINAL CLINICAL IMPRESSION(S) / ED DIAGNOSES  Weakness  Harvest Dark, MD 07/31/20 1421

## 2020-07-31 NOTE — ED Triage Notes (Signed)
Pt BIB [facility] for increased weakness x2 months, worsening over the past couple of days. EMS states husband states pt normally ambulated independently but now can only pivot and sit on wheelchair. Chronically on 2L nasal cannula, increased to 5L [place] by EMS, states O2 was in 80s with chronic 2L. Pt endorses shortness of breath earlier today. Pt's O2 100% at time of triage on 2L nasal cannula. EMS states per family that patient is at normal mentation, oriented to all but time.

## 2020-08-01 DIAGNOSIS — N1831 Chronic kidney disease, stage 3a: Secondary | ICD-10-CM

## 2020-08-01 DIAGNOSIS — Z862 Personal history of diseases of the blood and blood-forming organs and certain disorders involving the immune mechanism: Secondary | ICD-10-CM

## 2020-08-01 DIAGNOSIS — I10 Essential (primary) hypertension: Secondary | ICD-10-CM

## 2020-08-01 DIAGNOSIS — U071 COVID-19: Principal | ICD-10-CM

## 2020-08-01 DIAGNOSIS — E1122 Type 2 diabetes mellitus with diabetic chronic kidney disease: Secondary | ICD-10-CM

## 2020-08-01 DIAGNOSIS — N189 Chronic kidney disease, unspecified: Secondary | ICD-10-CM

## 2020-08-01 LAB — MAGNESIUM: Magnesium: 2.2 mg/dL (ref 1.7–2.4)

## 2020-08-01 LAB — COMPREHENSIVE METABOLIC PANEL
ALT: 17 U/L (ref 0–44)
AST: 19 U/L (ref 15–41)
Albumin: 3.3 g/dL — ABNORMAL LOW (ref 3.5–5.0)
Alkaline Phosphatase: 59 U/L (ref 38–126)
Anion gap: 10 (ref 5–15)
BUN: 18 mg/dL (ref 8–23)
CO2: 34 mmol/L — ABNORMAL HIGH (ref 22–32)
Calcium: 8.5 mg/dL — ABNORMAL LOW (ref 8.9–10.3)
Chloride: 98 mmol/L (ref 98–111)
Creatinine, Ser: 0.76 mg/dL (ref 0.44–1.00)
GFR, Estimated: >60 mL/min (ref >60)
Glucose, Bld: 65 mg/dL — ABNORMAL LOW (ref 70–99)
Potassium: 4 mmol/L (ref 3.5–5.1)
Sodium: 142 mmol/L (ref 135–145)
Total Bilirubin: 0.6 mg/dL (ref 0.3–1.2)
Total Protein: 6.6 g/dL (ref 6.5–8.1)

## 2020-08-01 LAB — FERRITIN: Ferritin: 151 ng/mL (ref 11–307)

## 2020-08-01 LAB — CBC WITH DIFFERENTIAL/PLATELET
Abs Immature Granulocytes: 0.02 10*3/uL (ref 0.00–0.07)
Basophils Absolute: 0 10*3/uL (ref 0.0–0.1)
Basophils Relative: 0 %
Eosinophils Absolute: 0 10*3/uL (ref 0.0–0.5)
Eosinophils Relative: 1 %
HCT: 32.9 % — ABNORMAL LOW (ref 36.0–46.0)
Hemoglobin: 10.5 g/dL — ABNORMAL LOW (ref 12.0–15.0)
Immature Granulocytes: 0 %
Lymphocytes Relative: 14 %
Lymphs Abs: 0.6 10*3/uL — ABNORMAL LOW (ref 0.7–4.0)
MCH: 30.3 pg (ref 26.0–34.0)
MCHC: 31.9 g/dL (ref 30.0–36.0)
MCV: 95.1 fL (ref 80.0–100.0)
Monocytes Absolute: 0.5 10*3/uL (ref 0.1–1.0)
Monocytes Relative: 11 %
Neutro Abs: 3.3 10*3/uL (ref 1.7–7.7)
Neutrophils Relative %: 74 %
Platelets: 163 10*3/uL (ref 150–400)
RBC: 3.46 MIL/uL — ABNORMAL LOW (ref 3.87–5.11)
RDW: 14.9 % (ref 11.5–15.5)
WBC: 4.5 10*3/uL (ref 4.0–10.5)
nRBC: 0 % (ref 0.0–0.2)

## 2020-08-01 LAB — GLUCOSE, CAPILLARY
Glucose-Capillary: 103 mg/dL — ABNORMAL HIGH (ref 70–99)
Glucose-Capillary: 112 mg/dL — ABNORMAL HIGH (ref 70–99)
Glucose-Capillary: 118 mg/dL — ABNORMAL HIGH (ref 70–99)
Glucose-Capillary: 120 mg/dL — ABNORMAL HIGH (ref 70–99)
Glucose-Capillary: 142 mg/dL — ABNORMAL HIGH (ref 70–99)
Glucose-Capillary: 55 mg/dL — ABNORMAL LOW (ref 70–99)
Glucose-Capillary: 68 mg/dL — ABNORMAL LOW (ref 70–99)
Glucose-Capillary: 70 mg/dL (ref 70–99)

## 2020-08-01 LAB — C-REACTIVE PROTEIN: CRP: 9.6 mg/dL — ABNORMAL HIGH (ref <1.0)

## 2020-08-01 LAB — D-DIMER, QUANTITATIVE: D-Dimer, Quant: 1.1 ug/mL-FEU — ABNORMAL HIGH (ref 0.00–0.50)

## 2020-08-01 LAB — PHOSPHORUS: Phosphorus: 3.1 mg/dL (ref 2.5–4.6)

## 2020-08-01 NOTE — Significant Event (Signed)
Pt BG 55 at 0041. Pt asymptomatic. Drank 4 oz OJ. BG 120 at 0111. MD notified.

## 2020-08-01 NOTE — Progress Notes (Addendum)
PROGRESS NOTE  [name] [name]  GUR:[id number] DOB: 27-May-1940 DOA: 07/31/2020 PCP: Jerrol Banana., MD   Brief Narrative: [name] [name] is a 80 y.o. female with 2L O2-dependent, CAD T2DM, stage IIIa CKD, HTN, and HLD who was brought to the ED by EMS on 4/27 for acute on chronic generalized weakness. She reports several months of gradual diffuse weakness limiting mobility to transfers, though was unable to get out of bed over the past couple days. Work up was significant for hypoglycemia, blood glucose of 50, and positive SARS-CoV-2 PCR (despite [place] vaccination x3) without CXR infiltrates or hypoxia on home O2. CRP 9.6, d-dimer 1.1, ferritin 151, CK and troponin wnl. PT evaluation is requested and remdesivir initiated.   Assessment & Plan: Principal Problem:   Generalized weakness Active Problems:   HYPERTENSION, BENIGN   COPD (chronic obstructive pulmonary disease) with emphysema (HCC)   CAD (coronary artery disease)   Adult hypothyroidism   COVID-19 virus detected   Type 2 diabetes mellitus with stage 3 chronic kidney disease (HCC)   History of anemia due to CKD  Generalized weakness: CT head nonacute. No metabolic derangements or infection thus far identified. Suspect progressive deconditioning over past few months with possible contribution from hypoglycemia, now with acute worsening due to covid-19 infection. - PT evaluation pending.  Covid-19 infection: s/p Pfizer x3, last in Jan 2022. No pneumonia on CXR or hypoxia. Inflammatory marker elevation still suggests this high risk patient would benefit from remdesivir.  - Continue remdesivir. LFTs wnl, will be monitored. CRP and d-dimer to be trended.  - Continue isolation x10 days per protocol.  - Monitor respiratory status for indication for steroids.  - Lovenox for VTE ppx.   T2DM with hypoglycemia:  - No medications for diabetes at this time. D50 prn. HbA1c 6% in 79yo F indicates too tight of control chronically. Will not  plan to restart home medications unless CBGs rise significantly.   Chronic hypoxic respiratory failure, COPD without exacerbation:  - Continue 2L O2.   AKI on stage IIIa CKD: AKI improved. - Avoid nephrotoxins.   AOCD: No bleeding noted.  - Monitor for bleeding.  MCI:  - Continue aricept  CAD:  - Continue ASA, plavix, BB, statin, prn NTG.   DVT prophylaxis: Lovenox Code Status: Full Family Communication: None at bedside. Disposition Plan:  Status is: Inpatient  Remains inpatient appropriate because:Unsafe d/c plan   Dispo: The patient is from: Home              Anticipated d/c is to: SNF              Patient currently is not medically stable to d/c.   Difficult to place patient No  Consultants:   PT, TOC  Procedures:   None  Antimicrobials:  Remdesivir 4/27 >>   Subjective: Pt with stable severe diffuse weakness, no focal numbness or weakness, no pain, no dyspnea.   Objective: Vitals:   08/01/20 0038 08/01/20 0425 08/01/20 0807 08/01/20 1147  BP: (!) 154/69 (!) 136/57 (!) 152/64 (!) 132/47  Pulse: 76 71 76 (!) 45  Resp: 16 19 17 17   Temp: 98.6 F (37 C) 98.8 F (37.1 C) 98.1 F (36.7 C) 97.7 F (36.5 C)  TempSrc: Oral Oral Oral   SpO2: 94% 96% 92% 92%  Weight:      Height:        Intake/Output Summary (Last 24 hours) at 08/01/2020 1446 Last data filed at 08/01/2020 0425 Gross per 24  hour  Intake --  Output 300 ml  Net -300 ml   Filed Weights   07/31/20 1017  Weight: 59.6 kg    Gen: 80 y.o. female in no distress, frail Pulm: Non-labored breathing 2L O2, diminished bilaterally.  CV: Regular rate and rhythm. No murmur, rub, or gallop. No JVD, no pedal edema. GI: Abdomen soft, non-tender, non-distended, with normoactive bowel sounds. No organomegaly or masses felt. Ext: Warm, no deformities Skin: No rashes, lesions or ulcers on visualized skin. Facial hirsutism noted. Neuro: Alert and oriented. No focal neurological deficits. Psych:  Judgement and insight appear normal. Mood & affect appropriate.   Data Reviewed: I have personally reviewed following labs and imaging studies  CBC: Recent Labs  Lab 07/31/20 1022 08/01/20 0434  WBC 6.8 4.5  NEUTROABS  --  3.3  HGB 10.4* 10.5*  HCT 32.8* 32.9*  MCV 96.2 95.1  PLT 175 144   Basic Metabolic Panel: Recent Labs  Lab 07/31/20 1022 08/01/20 0434  NA 138 142  K 4.3 4.0  CL 94* 98  CO2 32 34*  GLUCOSE 50* 65*  BUN 26* 18  CREATININE 1.26* 0.76  CALCIUM 9.0 8.5*  MG  --  2.2  PHOS  --  3.1   GFR: Estimated Creatinine Clearance: 52.2 mL/min (by C-G formula based on SCr of 0.76 mg/dL). Liver Function Tests: Recent Labs  Lab 07/31/20 1022 08/01/20 0434  AST 23 19  ALT 18 17  ALKPHOS 63 59  BILITOT 0.6 0.6  PROT 7.5 6.6  ALBUMIN 3.8 3.3*   No results for input(s): LIPASE, AMYLASE in the last 168 hours. No results for input(s): AMMONIA in the last 168 hours. Coagulation Profile: No results for input(s): INR, PROTIME in the last 168 hours. Cardiac Enzymes: Recent Labs  Lab 07/31/20 1302  CKTOTAL 90   BNP (last 3 results) No results for input(s): PROBNP in the last 8760 hours. HbA1C: Recent Labs    07/31/20 1046  HGBA1C 6.0*   CBG: Recent Labs  Lab 08/01/20 0112 08/01/20 0429 08/01/20 0514 08/01/20 0807 08/01/20 1151  GLUCAP 120* 70 118* 68* 103*   Lipid Profile: No results for input(s): CHOL, HDL, LDLCALC, TRIG, CHOLHDL, LDLDIRECT in the last 72 hours. Thyroid Function Tests: No results for input(s): TSH, T4TOTAL, FREET4, T3FREE, THYROIDAB in the last 72 hours. Anemia Panel: Recent Labs    08/01/20 0434  FERRITIN 151   Urine analysis:    Component Value Date/Time   COLORURINE YELLOW (A) 07/31/2020 1302   APPEARANCEUR CLOUDY (A) 07/31/2020 1302   APPEARANCEUR Clear 04/20/2019 0000   LABSPEC 1.013 07/31/2020 1302   PHURINE 8.0 07/31/2020 1302   GLUCOSEU 150 (A) 07/31/2020 1302   HGBUR NEGATIVE 07/31/2020 1302   BILIRUBINUR  NEGATIVE 07/31/2020 1302   BILIRUBINUR Negative 09/12/2019 1559   KETONESUR NEGATIVE 07/31/2020 1302   PROTEINUR 30 (A) 07/31/2020 1302   UROBILINOGEN 0.2 09/12/2019 1559   NITRITE NEGATIVE 07/31/2020 1302   LEUKOCYTESUR NEGATIVE 07/31/2020 1302   Recent Results (from the past 240 hour(s))  Resp Panel by RT-PCR (Flu A&B, Covid) Nasopharyngeal Swab     Status: Abnormal   Collection Time: 07/31/20 11:59 AM   Specimen: Nasopharyngeal Swab; Nasopharyngeal(NP) swabs in vial transport medium  Result Value Ref Range Status   SARS Coronavirus 2 by RT PCR POSITIVE (A) NEGATIVE Final    Comment: RESULT CALLED TO, READ BACK BY AND VERIFIED WITH: KASSIE FELTS @1432  07/31/20 MJU (NOTE) SARS-CoV-2 target nucleic acids are DETECTED.  The SARS-CoV-2 RNA  is generally detectable in upper respiratory specimens during the acute phase of infection. Positive results are indicative of the presence of the identified virus, but do not rule out bacterial infection or co-infection with other pathogens not detected by the test. Clinical correlation with patient history and other diagnostic information is necessary to determine patient infection status. The expected result is Negative.  Fact Sheet for Patients: EntrepreneurPulse.com.au  Fact Sheet for Healthcare Providers: IncredibleEmployment.be  This test is not yet approved or cleared by the [place] FDA and  has been authorized for detection and/or diagnosis of SARS-CoV-2 by FDA under an Emergency Use Authorization (EUA).  This EUA will remain in effect (meaning this test can be [place] ed) for the duration of  the COVID-19 declaration under Section 564(b)(1) of the Act, 21 U.S.C. section 360bbb-3(b)(1), unless the authorization is terminated or revoked sooner.     Influenza A by PCR NEGATIVE NEGATIVE Final   Influenza B by PCR NEGATIVE NEGATIVE Final    Comment: (NOTE) The Xpert Xpress SARS-CoV-2/FLU/RSV plus  assay is intended as an aid in the diagnosis of influenza from Nasopharyngeal swab specimens and should not be used as a sole basis for treatment. Nasal washings and aspirates are unacceptable for Xpert Xpress SARS-CoV-2/FLU/RSV testing.  Fact Sheet for Patients: EntrepreneurPulse.com.au  Fact Sheet for Healthcare Providers: IncredibleEmployment.be  This test is not yet approved or cleared by the [place] FDA and has been authorized for detection and/or diagnosis of SARS-CoV-2 by FDA under an Emergency Use Authorization (EUA). This EUA will remain in effect (meaning this test can be used) for the duration of the COVID-19 declaration under Section 564(b)(1) of the Act, 21 U.S.C. section 360bbb-3(b)(1), unless the authorization is terminated or revoked.  Performed at [facility], [address] [place], [place] [address]       Radiology Studies: DG Chest 2 View  Result Date: 07/31/2020 CLINICAL DATA:  Shortness of breath EXAM: CHEST - 2 VIEW COMPARISON:  June 06, 2019 chest radiograph and chest CT December 25, 2019 FINDINGS: Underlying emphysematous change, better delineated on prior CT. There is currently no edema or airspace opacity. Heart size and pulmonary vascularity are normal. No adenopathy. There is aortic atherosclerosis. There is postop change in the lower cervical region. IMPRESSION: Underlying emphysematous change, better delineated on CT. No edema or airspace opacity. Heart size normal. Aortic atherosclerosis. Aortic Atherosclerosis (ICD10-I70.0) and Emphysema (ICD10-J43.9). Electronically Signed   By: Lowella Grip III M.D.   On: 07/31/2020 11:11   CT Head Wo Contrast  Result Date: 07/31/2020 CLINICAL DATA:  Difficulty with ambulation EXAM: CT HEAD WITHOUT CONTRAST TECHNIQUE: Contiguous axial images were obtained from the base of the skull through the vertex without intravenous contrast. COMPARISON:  Brain MRI  May 06, 2018; head CT March 30, 2017. FINDINGS: Brain: Moderate diffuse atrophy is stable. There is no intracranial mass, hemorrhage, extra-axial fluid collection, or midline shift. There is evidence of a prior small infarct in the medial right thalamus, stable. Decreased attenuation throughout much of the centra semiovale bilaterally is stable. No acute appearing infarct evident. Vascular: No hyperdense vessel. There is calcification in each carotid siphon region. Skull: Bony calvarium appears intact. Sinuses/Orbits: There is mucosal thickening and opacification in multiple ethmoid air cells. Small air-fluid level in each sphenoid sinus. There is mucosal thickening in the inferior left frontal sinus. Visualized orbits appear symmetric bilaterally. Other: Minimal opacification of several inferior left mastoid air cells. Visualized right-sided mastoid air cells are clear. IMPRESSION: Stable atrophy  with periventricular small vessel disease. Prior small infarct medial right thalamus. No acute infarct evident. No mass or hemorrhage. There are foci of arterial vascular calcification. There are foci of paranasal sinus disease at multiple sites as well as mild inferior left mastoid air cell disease. Electronically Signed   By: Lowella Grip III M.D.   On: 07/31/2020 11:50    Scheduled Meds: . amLODipine  5 mg Oral Daily  . aspirin EC  81 mg Oral Daily  . atorvastatin  40 mg Oral Daily  . cholecalciferol  1,000 Units Oral Daily  . clopidogrel  75 mg Oral Daily  . donepezil  5 mg Oral QHS  . enoxaparin (LOVENOX) injection  40 mg Subcutaneous Q2200  . gabapentin  300 mg Oral BID  . isosorbide mononitrate  60 mg Oral Daily  . magnesium oxide  400 mg Oral BID  . metoprolol succinate  100 mg Oral Daily  . multivitamin with minerals  0.5 tablet Oral BID  . pantoprazole  40 mg Oral QAC breakfast  . sodium chloride flush  3 mL Intravenous Q12H  . zinc sulfate  220 mg Oral Daily   Continuous  Infusions: . sodium chloride    . remdesivir 100 mg in NS 100 mL 100 mg (08/01/20 1306)     LOS: 1 day   Time spent: 25 minutes.  Patrecia Pour, MD [facility] [email or web address] 08/01/2020, 2:46 PM

## 2020-08-01 NOTE — Evaluation (Signed)
Physical Therapy Evaluation Patient Details Name: [name] [name] MRN: [id number] DOB: 02/01/1941 Today's Date: 08/01/2020   History of Present Illness  Pt is a 80 y.o. female presenting to hospital 4/27 with increasing generalized weakness x2 months (worsening past couple of days) and difficulty with ambulation.  PMH includes chronic 2 L [place], COPD, CAD, DM, HLD, htn, lung CA, neutropenia, drug-induced polyneuropathy, syncope, stroke, claudication, h/o back surgery, cardiac cath, endarterectomy.  Clinical Impression  Prior to hospital admission, pt required assist with w/c level transfers from her husband (pt's husband also pushed pt in manual w/c); lives on main level of home.  Currently pt is SBA with bed mobility and mod assist stand pivot transfers bed to/from recliner.  After initial transfer to recliner, pt noted to be incontinent of bowel requiring clean up (pt needing to transfer back to bed d/t extent of bowel incontinence).  NT notified and came to assist (pt requiring complete bed change; pt requiring significant assist with clean-up in bed).  Pt would benefit from skilled PT to address noted impairments and functional limitations (see below for any additional details).  Upon hospital discharge, pt would benefit from continued therapy to improve overall strength and decrease caregiver assist levels required at home (if pt does not have physical assist required for safe discharge home, pt would benefit from SNF).     Follow Up Recommendations SNF (pending level of physical assist available at home)    Equipment Recommendations  Other (comment) (pt has manual w/c at home already)    Recommendations for Other Services OT consult     Precautions / Restrictions Precautions Precautions: Fall Restrictions Weight Bearing Restrictions: No      Mobility  Bed Mobility Overal bed mobility: Needs Assistance Bed Mobility: Supine to Sit;Sit to Supine;Rolling Rolling: Min assist (logrolling L  and R in bed for clean-up d/t bowel incontinence)   Supine to sit: Supervision;HOB elevated Sit to supine: Supervision;HOB elevated   General bed mobility comments: increased effort and time to perform on own    Transfers Overall transfer level: Needs assistance Equipment used: None Transfers: Stand Pivot Transfers   Stand pivot transfers: Mod assist       General transfer comment: stand pivot transfer bed to/from recliner  Ambulation/Gait             General Gait Details: Deferred (pt reports not being ambulatory recently)  Stairs            Wheelchair Mobility    Modified Rankin (Stroke Patients Only)       Balance Overall balance assessment: Needs assistance Sitting-balance support: No upper extremity supported;Feet supported Sitting balance-Leahy Scale: Good Sitting balance - Comments: steady sitting reaching within BOS   Standing balance support: Bilateral upper extremity supported Standing balance-Leahy Scale: Poor Standing balance comment: pt requiring assist for static standing balance                             Pertinent Vitals/Pain Pain Assessment: No/denies pain  2 L O2 via nasal cannula    Home Living Family/patient expects to be discharged to:: Private residence Living Arrangements: Spouse/significant other Available Help at Discharge: Family Type of Home: House Home Access: Stairs to enter Entrance Stairs-Rails: None Entrance Stairs-Number of Steps: 1 Home Layout: Two level;Able to live on main level with bedroom/bathroom Home Equipment: Grab bars - tub/shower;Grab bars - toilet;Shower seat;Wheelchair - manual      Prior Function Level of Independence:  Needs assistance   Gait / Transfers Assistance Needed: Pt reports requiring assist with transfers last 3-4 months (w/c level).  Husband pushes pt in w/c.  ADL's / Homemaking Assistance Needed: Assistance with ADL's and IADL's  Comments: 2 L home O2 use baseline.      Hand Dominance        Extremity/Trunk Assessment   Upper Extremity Assessment Upper Extremity Assessment: Generalized weakness    Lower Extremity Assessment Lower Extremity Assessment: Generalized weakness    Cervical / Trunk Assessment Cervical / Trunk Assessment: Normal  Communication   Communication: No difficulties  Cognition Arousal/Alertness: Awake/alert Behavior During Therapy: WFL for tasks assessed/performed Overall Cognitive Status: Within Functional Limits for tasks assessed                                        General Comments   Nursing cleared pt for participation in physical therapy.  Pt agreeable to PT session.    Exercises     Assessment/Plan    PT Assessment Patient needs continued PT services  PT Problem List Decreased strength;Decreased activity tolerance;Decreased balance;Decreased mobility       PT Treatment Interventions DME instruction;Functional mobility training;Therapeutic activities;Therapeutic exercise;Balance training;Patient/family education    PT Goals (Current goals can be found in the Care Plan section)  Acute Rehab PT Goals Patient Stated Goal: to go home PT Goal Formulation: With patient Time For Goal Achievement: 08/15/20 Potential to Achieve Goals: Fair    Frequency Min 2X/week   Barriers to discharge Decreased caregiver support (Question level of physical assist pt's husband able to provide)      Co-evaluation               AM-PAC PT "6 Clicks" Mobility  Outcome Measure Help needed turning from your back to your side while in a flat bed without using bedrails?: A Little Help needed moving from lying on your back to sitting on the side of a flat bed without using bedrails?: A Little Help needed moving to and from a bed to a chair (including a wheelchair)?: A Lot Help needed standing up from a chair using your arms (e.g., wheelchair or bedside chair)?: A Lot Help needed to walk in hospital  room?: Total Help needed climbing 3-5 steps with a railing? : Total 6 Click Score: 12    End of Session Equipment Utilized During Treatment: Gait belt Activity Tolerance: Patient limited by fatigue Patient left: in bed;with call bell/phone within reach;with bed alarm set;with nursing/sitter in room;Other (comment) (NT present finishing pt clean-up and to set pt up with lunch at end) Nurse Communication: Mobility status;Precautions PT Visit Diagnosis: Other abnormalities of gait and mobility (R26.89);Muscle weakness (generalized) (M62.81)    Time: 4268-3419 PT Time Calculation (min) (ACUTE ONLY): 55 min   Charges:   PT Evaluation $PT Eval Low Complexity: 1 Low PT Treatments $Therapeutic Activity: 23-37 mins       Leitha Bleak, PT 08/01/20, 2:40 PM

## 2020-08-01 NOTE — Progress Notes (Signed)
Inpatient Diabetes Program Recommendations  AACE/ADA: New Consensus Statement on Inpatient Glycemic Control   Target Ranges:  Prepandial:   less than 140 mg/dL      Peak postprandial:   less than 180 mg/dL (1-2 hours)      Critically ill patients:  140 - 180 mg/dL  Results for ALLEX, MADIA (MRN [id number]) as of 08/01/2020 09:23  Ref. Range 08/01/2020 00:41 08/01/2020 01:12 08/01/2020 04:29 08/01/2020 05:14 08/01/2020 08:07  Glucose-Capillary Latest Ref Range: 70 - 99 mg/dL 55 (L) 120 (H) 70 118 (H) 68 (L)  Results for KALYANI, MAEDA (MRN [id number]) as of 08/01/2020 09:23  Ref. Range 07/31/2020 12:47 07/31/2020 20:17  Glucose-Capillary Latest Ref Range: 70 - 99 mg/dL 145 (H) 94  Results for SHARIFAH, CHAMPINE (MRN [id number]) as of 08/01/2020 09:23  Ref. Range 07/31/2020 10:22  Glucose Latest Ref Range: 70 - 99 mg/dL 50 (L)   Results for ALEIGHYA, MCANELLY (MRN [id number]) as of 08/01/2020 09:23  Ref. Range 07/31/2020 10:46  Hemoglobin A1C Latest Ref Range: 4.8 - 5.6 % 6.0 (H)   Review of Glycemic Control  Diabetes history: DM2 Outpatient Diabetes medications: Amaryl 2 mg BID Current orders for Inpatient glycemic control: None; CBG monitoring  Inpatient Diabetes Program Recommendations:    Outpatient DM meds: Patient's initial glucose 50 mg/dl on 07/31/20 and patient noted to be hypoglycemic several times over the past 12 hours. Given A1C is 6% and noted hypoglycemia, would recommend discontinuing Amaryl outpatient and have patient follow up with PCP. If patient needs DM medication would recommend using oral DM medication with less risk of hypoglycemia (such as Tradjenta).  Thanks, Barnie Alderman, RN, MSN, CDE Diabetes Coordinator Inpatient Diabetes Program [phone] (Team Pager from 8am to 5pm)

## 2020-08-01 NOTE — TOC Initial Note (Signed)
Transition of Care [facility]) - Initial/Assessment Note    Patient Details  Name: [name] [name] MRN: [id number] Date of Birth: [birth year]  Transition of Care [facility]) CM/SW Contact:    Shelbie Hutching, RN Phone Number: 08/01/2020, 2:45 PM  Clinical Narrative:                 Patient admitted to the hospital with [facility].  Patient is from home with her husband.  RNCM was able to speak with patient's husband via phone, he reports he also tested positive but was feeling fine and has no symptoms.  Husband takes care of patient at home, she requires assistance with all ADL's but was able to stand and pivot to transfer.  Patient's husband brought her in because she was no longer even able to stand.  Husband reports that they have all needed DME at home.  Patient has had home health in the past.  Husband is not sure if he would want the patient to go to rehab, he will need to think about it.  PT consult has been ordered.   TOC will cont to follow.  Expected Discharge Plan: Skilled Nursing Facility Barriers to Discharge: Continued Medical Work up   Patient Goals and CMS Choice Patient states their goals for this hospitalization and ongoing recovery are:: Patient's husband is not sure what he would like for patient's discharge- he will need to think about rehab CMS Medicare.gov Compare Post Acute Care list provided to:: Patient Represenative (must comment) Choice offered to / list presented to : Spouse  Expected Discharge Plan and Services Expected Discharge Plan: [place]   Discharge Planning Services: CM Consult   Living arrangements for the past 2 months: Mobile Home                 DME Arranged: N/A DME Agency: NA                  Prior Living Arrangements/Services Living arrangements for the past 2 months: Mobile Home Lives with:: Spouse,Other (Comment) (step daughter) Patient language and need for interpreter reviewed:: Yes Do you feel safe going back to the place  where you live?: Yes      Need for Family Participation in Patient Care: Yes (Comment) (COVID, weakness) Care giver support system in place?: Yes (comment) (husband and daughter) Current home services: DME (walker, wheelchair) Criminal Activity/Legal Involvement Pertinent to Current Situation/Hospitalization: No - Comment as needed  Activities of Daily Living Home Assistive Devices/Equipment: Other (Comment) (cane and walker) ADL Screening (condition at time of admission) Patient's cognitive ability adequate to safely complete daily activities?: No Is the patient deaf or have difficulty hearing?: No Does the patient have difficulty seeing, even when wearing glasses/contacts?: Yes Does the patient have difficulty concentrating, remembering, or making decisions?: No Patient able to express need for assistance with ADLs?: No Does the patient have difficulty dressing or bathing?: No Independently performs ADLs?: Yes (appropriate for developmental age) Does the patient have difficulty walking or climbing stairs?: No Weakness of Legs: Both Weakness of Arms/Hands: None  Permission Sought/Granted Permission sought to share information with : Case Manager,Family Supports Permission granted to share information with : Yes, Verbal Permission Granted  Share Information with NAME: [name]     Permission granted to share info w Relationship: Husband     Emotional Assessment       Orientation: : Oriented to Self,Oriented to Place,Oriented to Situation Alcohol / Substance Use: Not Applicable Psych Involvement: No (comment)  Admission diagnosis:  Weakness [R53.1] Generalized weakness [R53.1] COVID-19 [U07.1] Patient Active Problem List   Diagnosis Date Noted  . Generalized weakness 07/31/2020  . COVID-19 virus detected 07/31/2020  . Type 2 diabetes mellitus with stage 3 chronic kidney disease ([place]) 07/31/2020  . History of anemia due to CKD 07/31/2020  . Neutropenia ([place]) 09/09/2019  .  Drug-induced polyneuropathy ([place]) 09/09/2019  . Inflammatory spondylopathy of lumbosacral region ([place]) 09/09/2019  . COPD (chronic obstructive pulmonary disease) ([place]) 06/06/2019  . GI bleed 12/28/2018  . Syncope 12/27/2018  . Blood per rectum 12/27/2018  . Constipation 12/27/2018  . Chest pain 09/23/2018  . Stroke (cerebrum) ([place]) 05/06/2018  . Malnutrition of moderate degree 03/31/2017  . Acute respiratory failure with hypoxia ([place]) 03/30/2017  . AKI (acute kidney injury) ([place]) 07/15/2016  . Protein-calorie malnutrition, severe 02/08/2016  . Primary cancer of right upper lobe of lung ([place]) 11/20/2015  . Abnormal CT lung screening 10/17/2015  . Personal history of tobacco use, presenting hazards to health 10/15/2015  . Chronic vulvitis 09/26/2014  . Allergic reaction 09/26/2014  . Carotid stenosis 08/30/2014  . Cervical nerve root disorder 08/10/2014  . CAD in native artery 08/10/2014  . B12 deficiency 08/10/2014  . Back ache 08/10/2014  . Bronchitis, chronic ([place]) 08/10/2014  . Diabetes mellitus with polyneuropathy ([place]) 08/10/2014  . Can't get food down 08/10/2014  . Eczema of external ear 08/10/2014  . Accumulation of fluid in tissues 08/10/2014  . Gout 08/10/2014  . Adult hypothyroidism 08/10/2014  . Mononeuritis 08/10/2014  . Muscle ache 08/10/2014  . Disorder of peripheral nervous system 08/10/2014  . Lesion of vulva 08/10/2014  . Cervical spondylosis with radiculopathy 10/19/2013  . COPD exacerbation ([place]) 03/29/2013  . CAD (coronary artery disease) 06/22/2011  . COPD (chronic obstructive pulmonary disease) with emphysema ([place]) 03/07/2010  . CHEST PAIN UNSPECIFIED 07/22/2009  . HLD (hyperlipidemia) 04/01/2009  . Malaise and fatigue 04/01/2009  . TOBACCO ABUSE 01/18/2009  . HYPERTENSION, BENIGN 01/18/2009  . CLAUDICATION 01/18/2009  . Pain in limb 01/18/2009  . CAFL (chronic airflow limitation) ([place]) 01/20/2007  . Late effects of cerebrovascular disease 01/10/2007   . Essential (primary) hypertension 12/22/2006   PCP:  Jerrol Banana., MD Pharmacy:   [id number] - [place], [address] [address] Phone: [phone] Fax: [phone]  [place], [address], [address] [place], [place] [address] [address] Phone: [phone] Fax: [phone]     Social Determinants of Health (SDOH) Interventions    Readmission Risk Interventions No flowsheet data found.

## 2020-08-02 LAB — COMPREHENSIVE METABOLIC PANEL
ALT: 22 U/L (ref 0–44)
AST: 23 U/L (ref 15–41)
Albumin: 3.4 g/dL — ABNORMAL LOW (ref 3.5–5.0)
Alkaline Phosphatase: 59 U/L (ref 38–126)
Anion gap: 10 (ref 5–15)
BUN: 25 mg/dL — ABNORMAL HIGH (ref 8–23)
CO2: 32 mmol/L (ref 22–32)
Calcium: 8.6 mg/dL — ABNORMAL LOW (ref 8.9–10.3)
Chloride: 96 mmol/L — ABNORMAL LOW (ref 98–111)
Creatinine, Ser: 0.85 mg/dL (ref 0.44–1.00)
GFR, Estimated: >60 mL/min (ref >60)
Glucose, Bld: 125 mg/dL — ABNORMAL HIGH (ref 70–99)
Potassium: 4.1 mmol/L (ref 3.5–5.1)
Sodium: 138 mmol/L (ref 135–145)
Total Bilirubin: 0.7 mg/dL (ref 0.3–1.2)
Total Protein: 6.9 g/dL (ref 6.5–8.1)

## 2020-08-02 LAB — GLUCOSE, CAPILLARY
Glucose-Capillary: 110 mg/dL — ABNORMAL HIGH (ref 70–99)
Glucose-Capillary: 139 mg/dL — ABNORMAL HIGH (ref 70–99)
Glucose-Capillary: 188 mg/dL — ABNORMAL HIGH (ref 70–99)
Glucose-Capillary: 66 mg/dL — ABNORMAL LOW (ref 70–99)
Glucose-Capillary: 79 mg/dL (ref 70–99)

## 2020-08-02 LAB — D-DIMER, QUANTITATIVE: D-Dimer, Quant: 1.05 ug/mL-FEU — ABNORMAL HIGH (ref 0.00–0.50)

## 2020-08-02 LAB — C-REACTIVE PROTEIN: CRP: 11 mg/dL — ABNORMAL HIGH (ref <1.0)

## 2020-08-02 NOTE — Significant Event (Signed)
Hypoglycemic Event  CBG:66  Treatment:Orange juice given  Symptoms: asymptomatic   Follow-up CBG: [id number] CBG Result:139  Possible Reasons for Event: poor intake  Comments/MD notified: T. Opyd at [facility]

## 2020-08-02 NOTE — Progress Notes (Signed)
Patient discharged home with nonemergent ems at 1210 in stable condition. VSS and Blood sugar= 188. Patient and husband received discharge education regarding medication regimen, follow up care, and diagnosis. Both [name] [name] and Halrd verbalized understanding of her plan of care. Home health was set up for the patiuent. Patient left with dentures, phone, clothing, and shoes.

## 2020-08-02 NOTE — Evaluation (Signed)
Occupational Therapy Evaluation Patient Details Name: [name] [name] MRN: [id number] DOB: 03-Jun-1940 Today's Date: 08/02/2020    History of Present Illness Pt is a 80 y.o. female presenting to hospital 4/27 with increasing generalized weakness x2 months (worsening past couple of days) and difficulty with ambulation.  PMH includes chronic 2 L [place], COPD, CAD, DM, HLD, htn, lung CA, neutropenia, drug-induced polyneuropathy, syncope, stroke, claudication, h/o back surgery, cardiac cath, endarterectomy.   Clinical Impression    Patient presenting with decreased I in self care, balance, functional mobility/transfer, endurance, and safety awareness.  Patient reports living at home with husband who assists her with self care tasks from bed level PTA. Pt does not ambulate but reports stand pivot from bed <>wheelchair and stays up in wheelchair during most of the day. No family present to confirm.Pt performed bed mobility with min A and sits on EOB with close supervision for balance.Pt able to demonstrate figure four position to don B socks with min guard for balance. Pt performed stand pivot transfer from bed >recliner chair with min A. Pt remained in recliner chair with chair alarm activated and all needs within reach. Patient will benefit from acute OT to increase overall independence in the areas of ADLs, functional mobility, and safety awareness in order to safely discharge home with husband.   Follow Up Recommendations  Supervision/Assistance - 24 hour;Home health OT    Equipment Recommendations  None recommended by OT    Recommendations for Other Services       Precautions / Restrictions Precautions Precautions: Fall Restrictions Weight Bearing Restrictions: No      Mobility Bed Mobility Overal bed mobility: Needs Assistance Bed Mobility: Supine to Sit;Sit to Supine;Rolling     Supine to sit: Supervision;HOB elevated Sit to supine: Supervision;HOB elevated   General bed mobility  comments: increased effort and time to perform on own    Transfers Overall transfer level: Needs assistance Equipment used: None Transfers: Stand Pivot Transfers   Stand pivot transfers: Min assist       General transfer comment: stand pivot transfer bed to/from recliner    Balance Overall balance assessment: Needs assistance Sitting-balance support: No upper extremity supported;Feet supported Sitting balance-Leahy Scale: Good Sitting balance - Comments: steady sitting reaching within BOS   Standing balance support: Bilateral upper extremity supported Standing balance-Leahy Scale: Poor                             ADL either performed or assessed with clinical judgement   ADL Overall ADL's : Needs assistance/impaired     Grooming: Wash/dry hands;Wash/dry face;Oral care;Sitting;Set up;Supervision/safety           Upper Body Dressing : Min guard   Lower Body Dressing: Sitting/lateral leans Lower Body Dressing Details (indicate cue type and reason): figure four position to donn socks                     Vision Patient Visual Report: No change from baseline       Perception     Praxis      Pertinent Vitals/Pain Pain Assessment: No/denies pain     Hand Dominance Right   Extremity/Trunk Assessment Upper Extremity Assessment Upper Extremity Assessment: Generalized weakness   Lower Extremity Assessment Lower Extremity Assessment: Generalized weakness   Cervical / Trunk Assessment Cervical / Trunk Assessment: Normal   Communication Communication Communication: No difficulties   Cognition Arousal/Alertness: Awake/alert Behavior During Therapy: WFL for  tasks assessed/performed Overall Cognitive Status: Within Functional Limits for tasks assessed                                                Home Living Family/patient expects to be discharged to:: Private residence Living Arrangements: Spouse/significant  other Available Help at Discharge: Family;Available 24 hours/day Type of Home: House Home Access: Stairs to enter [employer] of Steps: 1 Entrance Stairs-Rails: None Home Layout: Two level;Able to live on main level with bedroom/bathroom     Bathroom Shower/Tub: Teacher, early years/pre: Standard     Home Equipment: Grab bars - tub/shower;Grab bars - toilet;Shower seat;Wheelchair - manual          Prior Functioning/Environment Level of Independence: Needs assistance  Gait / Transfers Assistance Needed: Pt reports requiring assist with transfers last 3-4 months (w/c level).  Husband pushes pt in w/c. ADL's / Homemaking Assistance Needed: Assistance with ADL's( from bed level per pt report) and IADL's   Comments: 2 L home O2 use baseline.        OT Problem List: Decreased strength;Impaired balance (sitting and/or standing);Decreased safety awareness;Cardiopulmonary status limiting activity;Decreased activity tolerance;Decreased knowledge of use of DME or AE      OT Treatment/Interventions: Self-care/ADL training;Manual therapy;Therapeutic exercise;Patient/family education;Neuromuscular education;Balance training;Therapeutic activities;Energy conservation;DME and/or AE instruction    OT Goals(Current goals can be found in the care plan section) Acute Rehab OT Goals Patient Stated Goal: to go home OT Goal Formulation: With patient Time For Goal Achievement: 08/16/20 Potential to Achieve Goals: Good ADL Goals Pt Will Perform Eating: with modified independence Pt Will Transfer to Toilet: with min assist Pt Will Perform Toileting - Clothing Manipulation and hygiene: with min assist  OT Frequency: Min 2X/week   Barriers to D/C:    none known at this time          AM-PAC OT "6 Clicks" Daily Activity     Outcome Measure Help from another person eating meals?: None Help from another person taking care of personal grooming?: A Little Help from another  person toileting, which includes using toliet, bedpan, or urinal?: A Little Help from another person bathing (including washing, rinsing, drying)?: A Little Help from another person to put on and taking off regular upper body clothing?: None Help from another person to put on and taking off regular lower body clothing?: A Little 6 Click Score: 20   End of Session Equipment Utilized During Treatment: Oxygen Nurse Communication: Mobility status  Activity Tolerance: Patient tolerated treatment well Patient left: in chair;with call bell/phone within reach;with chair alarm set  OT Visit Diagnosis: Unsteadiness on feet (R26.81);Muscle weakness (generalized) (M62.81)                Time: 8832-5498 OT Time Calculation (min): 29 min Charges:  OT General Charges $OT Visit: 1 Visit OT Evaluation $OT Eval Moderate Complexity: 1 Mod OT Treatments $Self Care/Home Management : 8-22 mins  Darleen Crocker, MS, OTR/L , CBIS ascom [phone]  08/02/20, 1:10 PM

## 2020-08-02 NOTE — Care Management Important Message (Signed)
Important Message  Patient Details  Name: [name] [name] MRN: [id number] Date of Birth: [birth year]   Medicare Important Message Given:  N/A - LOS <3 / Initial given by admissions     [name] 08/02/2020, 8:23 AM

## 2020-08-02 NOTE — TOC Transition Note (Signed)
Transition of Care [facility]) - CM/SW Discharge Note   Patient Details  Name: [name] [name] MRN: [id number] Date of Birth: 06/12/1940  [facility] CM/SW Contact:  Shelbie Hutching, RN Phone Number: 08/02/2020, 11:09 AM   Clinical Narrative:    Patient is medically cleared for discharge home today with home health services.  [place] with [facility] has agrees to accept patient for home health PT and OT.  Patient's husband agrees with patient returning home, per patient she is at her baseline.  Patient's husband cannot come and pick her up he has COVID as well.  Patient is on Chronic O2 and weak and on airborne isolation.  [facility] arranged to transport patient home.    Final next level of care: Home w Home Health Services Barriers to Discharge: Barriers Resolved   Patient Goals and CMS Choice Patient states their goals for this hospitalization and ongoing recovery are:: Husband agrees with patient going home CMS Medicare.gov Compare Post Acute Care list provided to:: Patient Represenative (must comment) Choice offered to / list presented to : Spouse  Discharge Placement                Patient to be transferred to facility by: EMS to transport home Name of family member notified: [name] - husband Patient and family notified of of transfer: 08/02/20  Discharge Plan and Services   Discharge Planning Services: CM Consult            DME Arranged: N/A DME Agency: NA       HH Arranged: PT,OT [place] Agency: [facility] [place] Date [place] Agency Contacted: 08/02/20 Time [place]: 1108 Representative spoke with at [place]: [place] ([place]) Interventions     Readmission Risk Interventions No flowsheet data found.

## 2020-08-02 NOTE — Discharge Instructions (Signed)

## 2020-08-02 NOTE — Plan of Care (Signed)
  Problem: Education: Goal: Knowledge of General Education information will improve Description: Including pain rating scale, medication(s)/side effects and non-pharmacologic comfort measures Outcome: Progressing   Problem: Health Behavior/Discharge Planning: Goal: Ability to manage health-related needs will improve Outcome: Progressing   Problem: Clinical Measurements: Goal: Ability to maintain clinical measurements within normal limits will improve Outcome: Progressing Goal: Respiratory complications will improve Outcome: Progressing Goal: Cardiovascular complication will be avoided Outcome: Progressing   Problem: Nutrition: Goal: Adequate nutrition will be maintained Outcome: Progressing   Problem: Coping: Goal: Level of anxiety will decrease Outcome: Progressing   Problem: Elimination: Goal: Will not experience complications related to bowel motility Outcome: Progressing Goal: Will not experience complications related to urinary retention Outcome: Progressing   Problem: Skin Integrity: Goal: Risk for impaired skin integrity will decrease Outcome: Progressing   Problem: Education: Goal: Knowledge of risk factors and measures for prevention of condition will improve Outcome: Progressing   Problem: Coping: Goal: Psychosocial and spiritual needs will be supported Outcome: Progressing   Problem: Respiratory: Goal: Will maintain a patent airway Outcome: Progressing Goal: Complications related to the disease process, condition or treatment will be avoided or minimized Outcome: Progressing

## 2020-08-02 NOTE — Discharge Summary (Addendum)
Physician Discharge Summary  MANUEL LAWHEAD EGB:[id number] DOB: [birth year] DOA: 07/31/2020  PCP: Jerrol Banana., MD  Admit date: 07/31/2020 Discharge date: 08/02/2020  Admitted From: Home Disposition: Home   Recommendations for Outpatient Follow-up:  1. Follow up with PCP in 1-2 weeks 2. Monitor blood sugar. Stopped glimepiride due to hypoglycemia.  Home Health: PT, OT Equipment/Devices: None new, continue 2L O2. Discharge Condition: Stable CODE STATUS: Full Diet recommendation: Regular  Brief/Interim Summary: [name] [name] is a 80 y.o. female with 2L O2-dependent, CAD T2DM, stage IIIa CKD, HTN, and HLD who was brought to the ED by EMS on 4/27 for acute on chronic generalized weakness. She reports several months of gradual diffuse weakness limiting mobility to transfers, though was unable to get out of bed over the past couple days. Work up was significant for hypoglycemia, blood glucose of 50, and positive SARS-CoV-2 PCR (despite [place] vaccination x3) without CXR infiltrates or hypoxia on home O2. CRP 9.6, d-dimer 1.1, ferritin 151, CK and troponin wnl. PT evaluation is requested and remdesivir initiated. The patient demonstrated significant improvements in strength with therapy and is nearing her functional baseline at discharge. She and her husband feel comfortable returning home.  Discharge Diagnoses:  Principal Problem:   Generalized weakness Active Problems:   HYPERTENSION, BENIGN   COPD (chronic obstructive pulmonary disease) with emphysema (HCC)   CAD (coronary artery disease)   Adult hypothyroidism   COVID-19 virus detected   Type 2 diabetes mellitus with stage 3 chronic kidney disease (HCC)   History of anemia due to CKD  Generalized weakness: CT head nonacute. No metabolic derangements or infection thus far identified. Suspect progressive deconditioning over past few months with acute worsening due to covid-19 infection. - Improving  Covid-19 infection: s/p  Pfizer x3, last in Jan 2022. No pneumonia on CXR or hypoxia. Inflammatory marker elevation still suggests this high risk patient would benefit from remdesivir.  - Continue remdesivir. LFTs wnl, will be monitored. CRP and d-dimer to be trended.  - Continue isolation x10 days per protocol.   T2DM with hypoglycemia:  - No medications for diabetes at this time. D50 prn. HbA1c 6% in 79yo F indicates too tight of control chronically. Will not plan to restart home medications   Chronic hypoxic respiratory failure, COPD without exacerbation:  - Continue 2L O2.   AKI on stage IIIa CKD: AKI improved. - Avoid nephrotoxins.   AOCD: No bleeding noted.   MCI:  - Continue aricept  CAD:  - Continue ASA, plavix, BB, statin, prn NTG.   Discharge Instructions Discharge Instructions    Call MD for:  difficulty breathing, headache or visual disturbances   Complete by: As directed    Call MD for:  persistant nausea and vomiting   Complete by: As directed    Call MD for:  temperature >100.4   Complete by: As directed    Diet general   Complete by: As directed    Discharge instructions   Complete by: As directed    You are being discharged from the hospital after treatment for covid-19 infection. You are felt to be stable enough to no longer require inpatient monitoring, testing, and treatment, though you will need to follow the recommendations below: - YOUR BLOOD SUGAR WAS LOW when you arrived and without any medications, it remains not elevated. YOU SHOULD STOP TAKING AMARYL (GLIMEPIRIDE) to avoid low blood sugar again. Make sure you are eating enough and regularly to keep blood sugar levels regulated. - You  have completed remdesivir therapy while admitted and require no further treatment for covid. Your strength appears to be improved and you can continue getting stronger with home health physical and occupational therapy.  - Follow up with your doctor in the next week via telehealth or seek  medical attention right away if your symptoms get WORSE.  - Stay home for at least 5 days from your positive test and isolate from others in your home. Wear a well-fitted mask if you must be around others in your home. - End isolation after 5 full days if you are fever-free for 24 hours (without the use of fever-reducing medication) and your symptoms are improving. - Take precautions until day 10: Wear a well-fitted mask for 10 full days any time you are around others inside your home or in public. Do not go to places where you are unable to wear a mask. Avoid travel. Avoid being around people who are at high risk   Increase activity slowly   Complete by: As directed    MyChart COVID-19 home monitoring program   Complete by: Aug 02, 2020    Is the patient willing to use the [facility] for home monitoring?: Yes     Allergies as of 08/02/2020      Reactions   Coconut Fatty Acids Swelling, Other (See Comments)   Throat swells      Medication List    STOP taking these medications   glimepiride 2 MG tablet Commonly known as: AMARYL   LORazepam 1 MG tablet Commonly known as: ATIVAN     TAKE these medications   Accu-Chek FastClix Lancets Misc Check blood sugar up to 4 times daily.   Accu-Chek Guide test strip Generic drug: glucose blood CHECK BLOOD SUGAR ONCE  DAILY   Accu-Chek Guide w/Device Kit USE AS DIRECTED   albuterol 108 (90 Base) MCG/ACT inhaler Commonly known as: VENTOLIN HFA Inhale 2 puffs into the lungs every 4 (four) hours as needed for wheezing or shortness of breath.   amLODipine 5 MG tablet Commonly known as: NORVASC TAKE 1 TABLET BY MOUTH EVERY DAY   aspirin 81 MG EC tablet Take 1 tablet (81 mg total) by mouth daily.   atorvastatin 40 MG tablet Commonly known as: LIPITOR TAKE 1 TABLET BY MOUTH  DAILY AT 6 PM   clopidogrel 75 MG tablet Commonly known as: PLAVIX TAKE 1 TABLET BY MOUTH  DAILY   Comfeel Plus Ulcer Dressing Pads Apply once daily as  needed.   donepezil 5 MG tablet Commonly known as: ARICEPT TAKE 1 TABLET BY MOUTH EVERYDAY AT BEDTIME   gabapentin 600 MG tablet Commonly known as: NEURONTIN TAKE ONE-HALF TABLET BY  MOUTH TWICE DAILY   ICaps Caps Take 1 capsule by mouth 2 (two) times daily.   ipratropium-albuterol 0.5-2.5 (3) MG/3ML Soln Commonly known as: DUONEB Take 3 mLs by nebulization every 4 (four) hours as needed.   isosorbide mononitrate 60 MG 24 hr tablet Commonly known as: IMDUR TAKE 1 TABLET BY MOUTH  DAILY   magnesium oxide 400 (241.3 Mg) MG tablet Commonly known as: MAG-OX Take 400 mg by mouth 2 (two) times daily.   metoprolol succinate 100 MG 24 hr tablet Commonly known as: TOPROL-XL Take 1 tablet (100 mg total) by mouth daily.   mometasone 0.1 % lotion Commonly known as: ELOCON Apply 4 drops topically at bedtime as needed (dry skin).   multivitamin tablet Take 0.5 tablets by mouth 2 (two) times daily.   nitroGLYCERIN  0.4 MG SL tablet Commonly known as: NITROSTAT PLACE 1 TABLET UNDER THE TONGUE EVERY 5 MINUTES AS NEEDED FOR CHEST PAIN   pantoprazole 40 MG tablet Commonly known as: PROTONIX TAKE 1 TABLET BY MOUTH  DAILY   protective barrier Crea Apple twice daily.   VITAMIN D PO Take by mouth daily.   vitamin E 180 MG (400 UNITS) capsule Take 400 Units by mouth daily.   zinc sulfate 220 (50 Zn) MG capsule Take 220 mg by mouth daily.       Follow-up Information    Jerrol Banana., MD Follow up.   Specialty: Family Medicine Contact information: [address] [place] [address] [phone]              Allergies  Allergen Reactions  . Coconut Fatty Acids Swelling and Other (See Comments)    Throat swells    Consultations:  None  Procedures/Studies: DG Chest 2 View  Result Date: 07/31/2020 CLINICAL DATA:  Shortness of breath EXAM: CHEST - 2 VIEW COMPARISON:  June 06, 2019 chest radiograph and chest CT December 25, 2019 FINDINGS:  Underlying emphysematous change, better delineated on prior CT. There is currently no edema or airspace opacity. Heart size and pulmonary vascularity are normal. No adenopathy. There is aortic atherosclerosis. There is postop change in the lower cervical region. IMPRESSION: Underlying emphysematous change, better delineated on CT. No edema or airspace opacity. Heart size normal. Aortic atherosclerosis. Aortic Atherosclerosis (ICD10-I70.0) and Emphysema (ICD10-J43.9). Electronically Signed   By: Lowella Grip III M.D.   On: 07/31/2020 11:11   CT Head Wo Contrast  Result Date: 07/31/2020 CLINICAL DATA:  Difficulty with ambulation EXAM: CT HEAD WITHOUT CONTRAST TECHNIQUE: Contiguous axial images were obtained from the base of the skull through the vertex without intravenous contrast. COMPARISON:  Brain MRI May 06, 2018; head CT March 30, 2017. FINDINGS: Brain: Moderate diffuse atrophy is stable. There is no intracranial mass, hemorrhage, extra-axial fluid collection, or midline shift. There is evidence of a prior small infarct in the medial right thalamus, stable. Decreased attenuation throughout much of the centra semiovale bilaterally is stable. No acute appearing infarct evident. Vascular: No hyperdense vessel. There is calcification in each carotid siphon region. Skull: Bony calvarium appears intact. Sinuses/Orbits: There is mucosal thickening and opacification in multiple ethmoid air cells. Small air-fluid level in each sphenoid sinus. There is mucosal thickening in the inferior left frontal sinus. Visualized orbits appear symmetric bilaterally. Other: Minimal opacification of several inferior left mastoid air cells. Visualized right-sided mastoid air cells are clear. IMPRESSION: Stable atrophy with periventricular small vessel disease. Prior small infarct medial right thalamus. No acute infarct evident. No mass or hemorrhage. There are foci of arterial vascular calcification. There are foci of  paranasal sinus disease at multiple sites as well as mild inferior left mastoid air cell disease. Electronically Signed   By: Lowella Grip III M.D.   On: 07/31/2020 11:50    Subjective: Feels well. Working with PT stating she's currently able to get up at least as well as her previous baseline. No dyspnea or chest pain or fever.  Discharge Exam: Vitals:   08/02/20 0807 08/02/20 1023  BP: (!) 114/51   Pulse: 61 (!) 57  Resp: 20   Temp: 98.1 F (36.7 C)   SpO2: (!) 85% on ROOM AIR    General: Pt is alert, awake, not in acute distress Cardiovascular: RRR, S1/S2 +, no rubs, no gallops Respiratory: Nonlabored, clear throughout. SpO2 on exertion while working  with OT during my evaluation remained >90% Abdominal: Soft, NT, ND, bowel sounds + Extremities: No edema, no cyanosis  Labs: BNP (last 3 results) No results for input(s): BNP in the last 8760 hours. Basic Metabolic Panel: Recent Labs  Lab 07/31/20 1022 08/01/20 0434 08/02/20 0715  NA 138 142 138  K 4.3 4.0 4.1  CL 94* 98 96*  CO2 32 34* 32  GLUCOSE 50* 65* 125*  BUN 26* 18 25*  CREATININE 1.26* 0.76 0.85  CALCIUM 9.0 8.5* 8.6*  MG  --  2.2  --   PHOS  --  3.1  --    Liver Function Tests: Recent Labs  Lab 07/31/20 1022 08/01/20 0434 08/02/20 0715  AST _0 ALT _1 ALKPHOS 63 59 59  BILITOT 0.6 0.6 0.7  PROT 7.5 6.6 6.9  ALBUMIN 3.8 3.3* 3.4*   No results for input(s): LIPASE, AMYLASE in the last 168 hours. No results for input(s): AMMONIA in the last 168 hours. CBC: Recent Labs  Lab 07/31/20 1022 08/01/20 0434  WBC 6.8 4.5  NEUTROABS  --  3.3  HGB 10.4* 10.5*  HCT 32.8* 32.9*  MCV 96.2 95.1  PLT 175 163   Cardiac Enzymes: Recent Labs  Lab 07/31/20 1302  CKTOTAL 90   BNP: Invalid input(s): POCBNP CBG: Recent Labs  Lab 08/01/20 2001 08/02/20 0014 08/02/20 0528 08/02/20 0614 08/02/20 0804  GLUCAP 142* 79 66* 139* 110*   D-Dimer Recent Labs    08/01/20 0434  08/02/20 0553  DDIMER 1.10* 1.05*   Hgb A1c Recent Labs    07/31/20 1046  HGBA1C 6.0*   Lipid Profile No results for input(s): CHOL, HDL, LDLCALC, TRIG, CHOLHDL, LDLDIRECT in the last 72 hours. Thyroid function studies No results for input(s): TSH, T4TOTAL, T3FREE, THYROIDAB in the last 72 hours.  Invalid input(s): FREET3 Anemia work up Recent Labs    08/01/20 0434  FERRITIN 151   Urinalysis    Component Value Date/Time   COLORURINE YELLOW (A) 07/31/2020 1302   APPEARANCEUR CLOUDY (A) 07/31/2020 1302   APPEARANCEUR Clear 04/20/2019 0000   LABSPEC 1.013 07/31/2020 1302   PHURINE 8.0 07/31/2020 1302   GLUCOSEU 150 (A) 07/31/2020 1302   HGBUR NEGATIVE 07/31/2020 1302   BILIRUBINUR NEGATIVE 07/31/2020 1302   BILIRUBINUR Negative 09/12/2019 1559   KETONESUR NEGATIVE 07/31/2020 1302   PROTEINUR 30 (A) 07/31/2020 1302   UROBILINOGEN 0.2 09/12/2019 1559   NITRITE NEGATIVE 07/31/2020 1302   LEUKOCYTESUR NEGATIVE 07/31/2020 1302    Microbiology Recent Results (from the past 240 hour(s))  Resp Panel by RT-PCR (Flu A&B, Covid) Nasopharyngeal Swab     Status: Abnormal   Collection Time: 07/31/20 11:59 AM   Specimen: Nasopharyngeal Swab; Nasopharyngeal(NP) swabs in vial transport medium  Result Value Ref Range Status   SARS Coronavirus 2 by RT PCR POSITIVE (A) NEGATIVE Final    Comment: RESULT CALLED TO, READ BACK BY AND VERIFIED WITH: KASSIE FELTS _2  07/31/20 MJU (NOTE) SARS-CoV-2 target nucleic acids are DETECTED.  The SARS-CoV-2 RNA is generally detectable in upper respiratory specimens during the acute phase of infection. Positive results are indicative of the presence of the identified virus, but do not rule out bacterial infection or co-infection with other pathogens not detected by the test. Clinical correlation with patient history and other diagnostic information is necessary to determine patient infection status. The expected result is Negative.  Fact Sheet  for Patients: EntrepreneurPulse.com.au  Fact Sheet for Healthcare Providers: IncredibleEmployment.be  This  test is not yet approved or cleared by the [place] and  has been authorized for detection and/or diagnosis of SARS-CoV-2 by FDA under an Emergency Use Authorization (EUA).  This EUA will remain in effect (meaning this test can be [place] ed) for the duration of  the COVID-19 declaration under Section 564(b)(1) of the Act, 21 U.S.C. section 360bbb-3(b)(1), unless the authorization is terminated or revoked sooner.     Influenza A by PCR NEGATIVE NEGATIVE Final   Influenza B by PCR NEGATIVE NEGATIVE Final    Comment: (NOTE) The Xpert Xpress SARS-CoV-2/FLU/RSV plus assay is intended as an aid in the diagnosis of influenza from Nasopharyngeal swab specimens and should not be used as a sole basis for treatment. Nasal washings and aspirates are unacceptable for Xpert Xpress SARS-CoV-2/FLU/RSV testing.  Fact Sheet for Patients: EntrepreneurPulse.com.au  Fact Sheet for Healthcare Providers: IncredibleEmployment.be  This test is not yet approved or cleared by the [place] FDA and has been authorized for detection and/or diagnosis of SARS-CoV-2 by FDA under an Emergency Use Authorization (EUA). This EUA will remain in effect (meaning this test can be used) for the duration of the COVID-19 declaration under Section 564(b)(1) of the Act, 21 U.S.C. section 360bbb-3(b)(1), unless the authorization is terminated or revoked.  Performed at [facility], [address], [place], [place] [address]     Time coordinating discharge: Approximately 40 minutes  Patrecia Pour, MD  [facility] 08/02/2020, 10:50 AM

## 2020-08-05 ENCOUNTER — Ambulatory Visit: Payer: Self-pay | Admitting: *Deleted

## 2020-08-05 ENCOUNTER — Telehealth: Payer: Self-pay

## 2020-08-05 NOTE — Telephone Encounter (Signed)
Copied from [place] [id number]. Topic: General - Inquiry >> Aug 02, 2020 11:33 AM Scherrie Gerlach wrote: Reason for CRM: pt being discharged today from [facility] and in hospital with [facility]. Pt unaware this appt virtual. Husband has appt same day too.

## 2020-08-05 NOTE — Telephone Encounter (Signed)
Patient has an appt on Thursday. Did you still want this to be an in office visit? She was in the hospital with covid. Please advise. Thanks!

## 2020-08-05 NOTE — Telephone Encounter (Signed)
Tocara Mennen called in regarding [name] [name].  She was in the hospital for covid.    "She got so weak she could not stand up".   Her Covid test came back positive so they admitted her to the hospital".  She is much better now.   "I got Covid also".   "We are both just coughing now but much better".  He called in to see if she was supposed to be on any medication for her covid.   He is on the 5 day antiviral medication.  I looked in her chart but did not see any medication they prescribed for her.   He was aware they discontinued 2 of her medications the Amaryl and Ativan.   She wasn't taking the nerve pill anyway.  He thanked me for helping with that and talking with him.    He has an appt coming up with Dr. Rosanna Randy.  The agent has sent a note to Dr. Rosanna Randy to see if he wants to do a virtual visit with the [facility] or in office.   Dr. Rosanna Randy has not responded yet. I let Mr. Cenci know someone would be calling and letting him know.

## 2020-08-06 NOTE — Telephone Encounter (Signed)
Rescheduled in both for a week or 2 out.

## 2020-08-06 NOTE — Telephone Encounter (Signed)
Appt scheduled

## 2020-08-07 DIAGNOSIS — E1165 Type 2 diabetes mellitus with hyperglycemia: Secondary | ICD-10-CM | POA: Diagnosis not present

## 2020-08-07 DIAGNOSIS — M4697 Unspecified inflammatory spondylopathy, lumbosacral region: Secondary | ICD-10-CM | POA: Diagnosis not present

## 2020-08-07 DIAGNOSIS — E039 Hypothyroidism, unspecified: Secondary | ICD-10-CM | POA: Diagnosis not present

## 2020-08-07 DIAGNOSIS — J432 Centrilobular emphysema: Secondary | ICD-10-CM | POA: Diagnosis not present

## 2020-08-07 DIAGNOSIS — E538 Deficiency of other specified B group vitamins: Secondary | ICD-10-CM | POA: Diagnosis not present

## 2020-08-07 DIAGNOSIS — Z87891 Personal history of nicotine dependence: Secondary | ICD-10-CM | POA: Diagnosis not present

## 2020-08-07 DIAGNOSIS — E43 Unspecified severe protein-calorie malnutrition: Secondary | ICD-10-CM | POA: Diagnosis not present

## 2020-08-07 DIAGNOSIS — M4722 Other spondylosis with radiculopathy, cervical region: Secondary | ICD-10-CM | POA: Diagnosis not present

## 2020-08-07 DIAGNOSIS — E1122 Type 2 diabetes mellitus with diabetic chronic kidney disease: Secondary | ICD-10-CM | POA: Diagnosis not present

## 2020-08-07 DIAGNOSIS — I2510 Atherosclerotic heart disease of native coronary artery without angina pectoris: Secondary | ICD-10-CM | POA: Diagnosis not present

## 2020-08-07 DIAGNOSIS — H3530 Unspecified macular degeneration: Secondary | ICD-10-CM | POA: Diagnosis not present

## 2020-08-07 DIAGNOSIS — G3184 Mild cognitive impairment, so stated: Secondary | ICD-10-CM | POA: Diagnosis not present

## 2020-08-07 DIAGNOSIS — J9611 Chronic respiratory failure with hypoxia: Secondary | ICD-10-CM | POA: Diagnosis not present

## 2020-08-07 DIAGNOSIS — M109 Gout, unspecified: Secondary | ICD-10-CM | POA: Diagnosis not present

## 2020-08-07 DIAGNOSIS — Z8673 Personal history of transient ischemic attack (TIA), and cerebral infarction without residual deficits: Secondary | ICD-10-CM | POA: Diagnosis not present

## 2020-08-07 DIAGNOSIS — U071 COVID-19: Secondary | ICD-10-CM | POA: Diagnosis not present

## 2020-08-07 DIAGNOSIS — I6529 Occlusion and stenosis of unspecified carotid artery: Secondary | ICD-10-CM | POA: Diagnosis not present

## 2020-08-07 DIAGNOSIS — E785 Hyperlipidemia, unspecified: Secondary | ICD-10-CM | POA: Diagnosis not present

## 2020-08-07 DIAGNOSIS — I129 Hypertensive chronic kidney disease with stage 1 through stage 4 chronic kidney disease, or unspecified chronic kidney disease: Secondary | ICD-10-CM | POA: Diagnosis not present

## 2020-08-07 DIAGNOSIS — Z981 Arthrodesis status: Secondary | ICD-10-CM | POA: Diagnosis not present

## 2020-08-07 DIAGNOSIS — N183 Chronic kidney disease, stage 3 unspecified: Secondary | ICD-10-CM | POA: Diagnosis not present

## 2020-08-07 DIAGNOSIS — E1142 Type 2 diabetes mellitus with diabetic polyneuropathy: Secondary | ICD-10-CM | POA: Diagnosis not present

## 2020-08-07 DIAGNOSIS — D631 Anemia in chronic kidney disease: Secondary | ICD-10-CM | POA: Diagnosis not present

## 2020-08-07 DIAGNOSIS — Z9049 Acquired absence of other specified parts of digestive tract: Secondary | ICD-10-CM | POA: Diagnosis not present

## 2020-08-07 DIAGNOSIS — L89151 Pressure ulcer of sacral region, stage 1: Secondary | ICD-10-CM | POA: Diagnosis not present

## 2020-08-08 ENCOUNTER — Telehealth: Payer: Self-pay | Admitting: Family Medicine

## 2020-08-08 ENCOUNTER — Ambulatory Visit: Payer: Self-pay | Admitting: Family Medicine

## 2020-08-13 ENCOUNTER — Other Ambulatory Visit: Payer: Self-pay | Admitting: *Deleted

## 2020-08-13 ENCOUNTER — Telehealth: Payer: Self-pay | Admitting: Family Medicine

## 2020-08-13 DIAGNOSIS — H3530 Unspecified macular degeneration: Secondary | ICD-10-CM | POA: Diagnosis not present

## 2020-08-13 DIAGNOSIS — E785 Hyperlipidemia, unspecified: Secondary | ICD-10-CM | POA: Diagnosis not present

## 2020-08-13 DIAGNOSIS — Z87891 Personal history of nicotine dependence: Secondary | ICD-10-CM | POA: Diagnosis not present

## 2020-08-13 DIAGNOSIS — M4722 Other spondylosis with radiculopathy, cervical region: Secondary | ICD-10-CM | POA: Diagnosis not present

## 2020-08-13 DIAGNOSIS — D631 Anemia in chronic kidney disease: Secondary | ICD-10-CM | POA: Diagnosis not present

## 2020-08-13 DIAGNOSIS — L89151 Pressure ulcer of sacral region, stage 1: Secondary | ICD-10-CM | POA: Diagnosis not present

## 2020-08-13 DIAGNOSIS — M4697 Unspecified inflammatory spondylopathy, lumbosacral region: Secondary | ICD-10-CM | POA: Diagnosis not present

## 2020-08-13 DIAGNOSIS — I6529 Occlusion and stenosis of unspecified carotid artery: Secondary | ICD-10-CM | POA: Diagnosis not present

## 2020-08-13 DIAGNOSIS — J9611 Chronic respiratory failure with hypoxia: Secondary | ICD-10-CM | POA: Diagnosis not present

## 2020-08-13 DIAGNOSIS — J432 Centrilobular emphysema: Secondary | ICD-10-CM | POA: Diagnosis not present

## 2020-08-13 DIAGNOSIS — N183 Chronic kidney disease, stage 3 unspecified: Secondary | ICD-10-CM | POA: Diagnosis not present

## 2020-08-13 DIAGNOSIS — E1142 Type 2 diabetes mellitus with diabetic polyneuropathy: Secondary | ICD-10-CM | POA: Diagnosis not present

## 2020-08-13 DIAGNOSIS — E538 Deficiency of other specified B group vitamins: Secondary | ICD-10-CM | POA: Diagnosis not present

## 2020-08-13 DIAGNOSIS — E1122 Type 2 diabetes mellitus with diabetic chronic kidney disease: Secondary | ICD-10-CM | POA: Diagnosis not present

## 2020-08-13 DIAGNOSIS — I129 Hypertensive chronic kidney disease with stage 1 through stage 4 chronic kidney disease, or unspecified chronic kidney disease: Secondary | ICD-10-CM | POA: Diagnosis not present

## 2020-08-13 DIAGNOSIS — Z8673 Personal history of transient ischemic attack (TIA), and cerebral infarction without residual deficits: Secondary | ICD-10-CM | POA: Diagnosis not present

## 2020-08-13 DIAGNOSIS — Z9049 Acquired absence of other specified parts of digestive tract: Secondary | ICD-10-CM | POA: Diagnosis not present

## 2020-08-13 DIAGNOSIS — M109 Gout, unspecified: Secondary | ICD-10-CM | POA: Diagnosis not present

## 2020-08-13 DIAGNOSIS — E1165 Type 2 diabetes mellitus with hyperglycemia: Secondary | ICD-10-CM | POA: Diagnosis not present

## 2020-08-13 DIAGNOSIS — E039 Hypothyroidism, unspecified: Secondary | ICD-10-CM | POA: Diagnosis not present

## 2020-08-13 DIAGNOSIS — Z981 Arthrodesis status: Secondary | ICD-10-CM | POA: Diagnosis not present

## 2020-08-13 DIAGNOSIS — G3184 Mild cognitive impairment, so stated: Secondary | ICD-10-CM | POA: Diagnosis not present

## 2020-08-13 DIAGNOSIS — I2510 Atherosclerotic heart disease of native coronary artery without angina pectoris: Secondary | ICD-10-CM | POA: Diagnosis not present

## 2020-08-13 DIAGNOSIS — E43 Unspecified severe protein-calorie malnutrition: Secondary | ICD-10-CM | POA: Diagnosis not present

## 2020-08-13 DIAGNOSIS — U071 COVID-19: Secondary | ICD-10-CM | POA: Diagnosis not present

## 2020-08-13 MED ORDER — DOXYCYCLINE HYCLATE 100 MG PO TABS
100.0000 mg | ORAL_TABLET | Freq: Two times a day (BID) | ORAL | 0 refills | Status: DC
Start: 2020-08-13 — End: 2020-09-17

## 2020-08-13 MED ORDER — PREDNISONE 20 MG PO TABS: 20.0000 mg | ORAL_TABLET | Freq: Every day | ORAL | 0 refills | Status: DC

## 2020-08-13 NOTE — Telephone Encounter (Signed)
Pt husband [name] is calling and pt has been having cough for about 3 days. Pt is getting over covid.  Pt husband decline sooner appt. Pt is sch to see dr Rosanna Randy on 08-21-2020. Pt husband is calling to see what dr Rosanna Randy would like to do? [facility] [address] phone number [phone]

## 2020-08-13 NOTE — Telephone Encounter (Signed)
Rx's sent to pharmacy. Tried calling pt, no answer and no vm. Okay for [facility] triage to advise.

## 2020-08-13 NOTE — Telephone Encounter (Signed)
Doxy 100mg  BID and prednisone 20mg  daily--both for 5 days/

## 2020-08-13 NOTE — Telephone Encounter (Signed)
Please advise. Thanks.  

## 2020-08-15 DIAGNOSIS — I2510 Atherosclerotic heart disease of native coronary artery without angina pectoris: Secondary | ICD-10-CM | POA: Diagnosis not present

## 2020-08-15 DIAGNOSIS — E43 Unspecified severe protein-calorie malnutrition: Secondary | ICD-10-CM | POA: Diagnosis not present

## 2020-08-15 DIAGNOSIS — E785 Hyperlipidemia, unspecified: Secondary | ICD-10-CM | POA: Diagnosis not present

## 2020-08-15 DIAGNOSIS — N183 Chronic kidney disease, stage 3 unspecified: Secondary | ICD-10-CM | POA: Diagnosis not present

## 2020-08-15 DIAGNOSIS — Z87891 Personal history of nicotine dependence: Secondary | ICD-10-CM | POA: Diagnosis not present

## 2020-08-15 DIAGNOSIS — G3184 Mild cognitive impairment, so stated: Secondary | ICD-10-CM | POA: Diagnosis not present

## 2020-08-15 DIAGNOSIS — H3530 Unspecified macular degeneration: Secondary | ICD-10-CM | POA: Diagnosis not present

## 2020-08-15 DIAGNOSIS — Z9049 Acquired absence of other specified parts of digestive tract: Secondary | ICD-10-CM | POA: Diagnosis not present

## 2020-08-15 DIAGNOSIS — D631 Anemia in chronic kidney disease: Secondary | ICD-10-CM | POA: Diagnosis not present

## 2020-08-15 DIAGNOSIS — L89151 Pressure ulcer of sacral region, stage 1: Secondary | ICD-10-CM | POA: Diagnosis not present

## 2020-08-15 DIAGNOSIS — E1122 Type 2 diabetes mellitus with diabetic chronic kidney disease: Secondary | ICD-10-CM | POA: Diagnosis not present

## 2020-08-15 DIAGNOSIS — Z981 Arthrodesis status: Secondary | ICD-10-CM | POA: Diagnosis not present

## 2020-08-15 DIAGNOSIS — E039 Hypothyroidism, unspecified: Secondary | ICD-10-CM | POA: Diagnosis not present

## 2020-08-15 DIAGNOSIS — I6529 Occlusion and stenosis of unspecified carotid artery: Secondary | ICD-10-CM | POA: Diagnosis not present

## 2020-08-15 DIAGNOSIS — J9611 Chronic respiratory failure with hypoxia: Secondary | ICD-10-CM | POA: Diagnosis not present

## 2020-08-15 DIAGNOSIS — M4697 Unspecified inflammatory spondylopathy, lumbosacral region: Secondary | ICD-10-CM | POA: Diagnosis not present

## 2020-08-15 DIAGNOSIS — E1142 Type 2 diabetes mellitus with diabetic polyneuropathy: Secondary | ICD-10-CM | POA: Diagnosis not present

## 2020-08-15 DIAGNOSIS — M4722 Other spondylosis with radiculopathy, cervical region: Secondary | ICD-10-CM | POA: Diagnosis not present

## 2020-08-15 DIAGNOSIS — M109 Gout, unspecified: Secondary | ICD-10-CM | POA: Diagnosis not present

## 2020-08-15 DIAGNOSIS — U071 COVID-19: Secondary | ICD-10-CM | POA: Diagnosis not present

## 2020-08-15 DIAGNOSIS — J432 Centrilobular emphysema: Secondary | ICD-10-CM | POA: Diagnosis not present

## 2020-08-15 DIAGNOSIS — I129 Hypertensive chronic kidney disease with stage 1 through stage 4 chronic kidney disease, or unspecified chronic kidney disease: Secondary | ICD-10-CM | POA: Diagnosis not present

## 2020-08-15 DIAGNOSIS — E538 Deficiency of other specified B group vitamins: Secondary | ICD-10-CM | POA: Diagnosis not present

## 2020-08-15 DIAGNOSIS — Z8673 Personal history of transient ischemic attack (TIA), and cerebral infarction without residual deficits: Secondary | ICD-10-CM | POA: Diagnosis not present

## 2020-08-15 DIAGNOSIS — E1165 Type 2 diabetes mellitus with hyperglycemia: Secondary | ICD-10-CM | POA: Diagnosis not present

## 2020-08-19 DIAGNOSIS — I2510 Atherosclerotic heart disease of native coronary artery without angina pectoris: Secondary | ICD-10-CM | POA: Diagnosis not present

## 2020-08-19 DIAGNOSIS — M109 Gout, unspecified: Secondary | ICD-10-CM | POA: Diagnosis not present

## 2020-08-19 DIAGNOSIS — Z981 Arthrodesis status: Secondary | ICD-10-CM | POA: Diagnosis not present

## 2020-08-19 DIAGNOSIS — I129 Hypertensive chronic kidney disease with stage 1 through stage 4 chronic kidney disease, or unspecified chronic kidney disease: Secondary | ICD-10-CM | POA: Diagnosis not present

## 2020-08-19 DIAGNOSIS — E1165 Type 2 diabetes mellitus with hyperglycemia: Secondary | ICD-10-CM | POA: Diagnosis not present

## 2020-08-19 DIAGNOSIS — U071 COVID-19: Secondary | ICD-10-CM | POA: Diagnosis not present

## 2020-08-19 DIAGNOSIS — N183 Chronic kidney disease, stage 3 unspecified: Secondary | ICD-10-CM | POA: Diagnosis not present

## 2020-08-19 DIAGNOSIS — E43 Unspecified severe protein-calorie malnutrition: Secondary | ICD-10-CM | POA: Diagnosis not present

## 2020-08-19 DIAGNOSIS — Z87891 Personal history of nicotine dependence: Secondary | ICD-10-CM | POA: Diagnosis not present

## 2020-08-19 DIAGNOSIS — Z9049 Acquired absence of other specified parts of digestive tract: Secondary | ICD-10-CM | POA: Diagnosis not present

## 2020-08-19 DIAGNOSIS — I6529 Occlusion and stenosis of unspecified carotid artery: Secondary | ICD-10-CM | POA: Diagnosis not present

## 2020-08-19 DIAGNOSIS — E039 Hypothyroidism, unspecified: Secondary | ICD-10-CM | POA: Diagnosis not present

## 2020-08-19 DIAGNOSIS — M4697 Unspecified inflammatory spondylopathy, lumbosacral region: Secondary | ICD-10-CM | POA: Diagnosis not present

## 2020-08-19 DIAGNOSIS — E785 Hyperlipidemia, unspecified: Secondary | ICD-10-CM | POA: Diagnosis not present

## 2020-08-19 DIAGNOSIS — E538 Deficiency of other specified B group vitamins: Secondary | ICD-10-CM | POA: Diagnosis not present

## 2020-08-19 DIAGNOSIS — M4722 Other spondylosis with radiculopathy, cervical region: Secondary | ICD-10-CM | POA: Diagnosis not present

## 2020-08-19 DIAGNOSIS — D631 Anemia in chronic kidney disease: Secondary | ICD-10-CM | POA: Diagnosis not present

## 2020-08-19 DIAGNOSIS — G3184 Mild cognitive impairment, so stated: Secondary | ICD-10-CM | POA: Diagnosis not present

## 2020-08-19 DIAGNOSIS — E1122 Type 2 diabetes mellitus with diabetic chronic kidney disease: Secondary | ICD-10-CM | POA: Diagnosis not present

## 2020-08-19 DIAGNOSIS — J9611 Chronic respiratory failure with hypoxia: Secondary | ICD-10-CM | POA: Diagnosis not present

## 2020-08-19 DIAGNOSIS — E1142 Type 2 diabetes mellitus with diabetic polyneuropathy: Secondary | ICD-10-CM | POA: Diagnosis not present

## 2020-08-19 DIAGNOSIS — L89151 Pressure ulcer of sacral region, stage 1: Secondary | ICD-10-CM | POA: Diagnosis not present

## 2020-08-19 DIAGNOSIS — H3530 Unspecified macular degeneration: Secondary | ICD-10-CM | POA: Diagnosis not present

## 2020-08-19 DIAGNOSIS — J432 Centrilobular emphysema: Secondary | ICD-10-CM | POA: Diagnosis not present

## 2020-08-19 DIAGNOSIS — Z8673 Personal history of transient ischemic attack (TIA), and cerebral infarction without residual deficits: Secondary | ICD-10-CM | POA: Diagnosis not present

## 2020-08-21 ENCOUNTER — Telehealth: Payer: Self-pay

## 2020-08-21 ENCOUNTER — Ambulatory Visit: Payer: Self-pay | Admitting: Family Medicine

## 2020-08-21 NOTE — Telephone Encounter (Signed)
Volunteer called patient/family on behalf of [facility]. Patient is doing well at this time.  

## 2020-08-22 DIAGNOSIS — L89151 Pressure ulcer of sacral region, stage 1: Secondary | ICD-10-CM | POA: Diagnosis not present

## 2020-08-22 DIAGNOSIS — E1165 Type 2 diabetes mellitus with hyperglycemia: Secondary | ICD-10-CM | POA: Diagnosis not present

## 2020-08-22 DIAGNOSIS — J432 Centrilobular emphysema: Secondary | ICD-10-CM | POA: Diagnosis not present

## 2020-08-22 DIAGNOSIS — E538 Deficiency of other specified B group vitamins: Secondary | ICD-10-CM | POA: Diagnosis not present

## 2020-08-22 DIAGNOSIS — E785 Hyperlipidemia, unspecified: Secondary | ICD-10-CM | POA: Diagnosis not present

## 2020-08-22 DIAGNOSIS — N183 Chronic kidney disease, stage 3 unspecified: Secondary | ICD-10-CM | POA: Diagnosis not present

## 2020-08-22 DIAGNOSIS — E1122 Type 2 diabetes mellitus with diabetic chronic kidney disease: Secondary | ICD-10-CM | POA: Diagnosis not present

## 2020-08-22 DIAGNOSIS — Z981 Arthrodesis status: Secondary | ICD-10-CM | POA: Diagnosis not present

## 2020-08-22 DIAGNOSIS — E43 Unspecified severe protein-calorie malnutrition: Secondary | ICD-10-CM | POA: Diagnosis not present

## 2020-08-22 DIAGNOSIS — I129 Hypertensive chronic kidney disease with stage 1 through stage 4 chronic kidney disease, or unspecified chronic kidney disease: Secondary | ICD-10-CM | POA: Diagnosis not present

## 2020-08-22 DIAGNOSIS — E039 Hypothyroidism, unspecified: Secondary | ICD-10-CM | POA: Diagnosis not present

## 2020-08-22 DIAGNOSIS — I2510 Atherosclerotic heart disease of native coronary artery without angina pectoris: Secondary | ICD-10-CM | POA: Diagnosis not present

## 2020-08-22 DIAGNOSIS — J9611 Chronic respiratory failure with hypoxia: Secondary | ICD-10-CM | POA: Diagnosis not present

## 2020-08-22 DIAGNOSIS — D631 Anemia in chronic kidney disease: Secondary | ICD-10-CM | POA: Diagnosis not present

## 2020-08-22 DIAGNOSIS — M4722 Other spondylosis with radiculopathy, cervical region: Secondary | ICD-10-CM | POA: Diagnosis not present

## 2020-08-22 DIAGNOSIS — E1142 Type 2 diabetes mellitus with diabetic polyneuropathy: Secondary | ICD-10-CM | POA: Diagnosis not present

## 2020-08-22 DIAGNOSIS — M4697 Unspecified inflammatory spondylopathy, lumbosacral region: Secondary | ICD-10-CM | POA: Diagnosis not present

## 2020-08-22 DIAGNOSIS — Z87891 Personal history of nicotine dependence: Secondary | ICD-10-CM | POA: Diagnosis not present

## 2020-08-22 DIAGNOSIS — Z8673 Personal history of transient ischemic attack (TIA), and cerebral infarction without residual deficits: Secondary | ICD-10-CM | POA: Diagnosis not present

## 2020-08-22 DIAGNOSIS — Z9049 Acquired absence of other specified parts of digestive tract: Secondary | ICD-10-CM | POA: Diagnosis not present

## 2020-08-22 DIAGNOSIS — M109 Gout, unspecified: Secondary | ICD-10-CM | POA: Diagnosis not present

## 2020-08-22 DIAGNOSIS — I6529 Occlusion and stenosis of unspecified carotid artery: Secondary | ICD-10-CM | POA: Diagnosis not present

## 2020-08-22 DIAGNOSIS — G3184 Mild cognitive impairment, so stated: Secondary | ICD-10-CM | POA: Diagnosis not present

## 2020-08-22 DIAGNOSIS — H3530 Unspecified macular degeneration: Secondary | ICD-10-CM | POA: Diagnosis not present

## 2020-08-22 DIAGNOSIS — U071 COVID-19: Secondary | ICD-10-CM | POA: Diagnosis not present

## 2020-08-23 ENCOUNTER — Telehealth: Payer: Self-pay

## 2020-08-23 DIAGNOSIS — R0602 Shortness of breath: Secondary | ICD-10-CM | POA: Diagnosis not present

## 2020-08-23 DIAGNOSIS — J449 Chronic obstructive pulmonary disease, unspecified: Secondary | ICD-10-CM | POA: Diagnosis not present

## 2020-08-23 NOTE — Telephone Encounter (Signed)
Copied from [place] [id number]. Topic: General - Other >> Aug 23, 2020 12:23 PM Celene Kras wrote: Reason for CRM: Pts husband called stating that the pt is very sick and coughing. He states that they cannot come into the office and is requesting to have PCP follow up. Please advise.

## 2020-08-25 DIAGNOSIS — J449 Chronic obstructive pulmonary disease, unspecified: Secondary | ICD-10-CM | POA: Diagnosis not present

## 2020-08-26 NOTE — Telephone Encounter (Signed)
Please advise 

## 2020-08-27 ENCOUNTER — Telehealth: Payer: Self-pay | Admitting: Family Medicine

## 2020-08-27 DIAGNOSIS — Z8673 Personal history of transient ischemic attack (TIA), and cerebral infarction without residual deficits: Secondary | ICD-10-CM | POA: Diagnosis not present

## 2020-08-27 DIAGNOSIS — E1142 Type 2 diabetes mellitus with diabetic polyneuropathy: Secondary | ICD-10-CM | POA: Diagnosis not present

## 2020-08-27 DIAGNOSIS — N183 Chronic kidney disease, stage 3 unspecified: Secondary | ICD-10-CM | POA: Diagnosis not present

## 2020-08-27 DIAGNOSIS — I2510 Atherosclerotic heart disease of native coronary artery without angina pectoris: Secondary | ICD-10-CM | POA: Diagnosis not present

## 2020-08-27 DIAGNOSIS — M4722 Other spondylosis with radiculopathy, cervical region: Secondary | ICD-10-CM | POA: Diagnosis not present

## 2020-08-27 DIAGNOSIS — I129 Hypertensive chronic kidney disease with stage 1 through stage 4 chronic kidney disease, or unspecified chronic kidney disease: Secondary | ICD-10-CM | POA: Diagnosis not present

## 2020-08-27 DIAGNOSIS — M109 Gout, unspecified: Secondary | ICD-10-CM | POA: Diagnosis not present

## 2020-08-27 DIAGNOSIS — U071 COVID-19: Secondary | ICD-10-CM | POA: Diagnosis not present

## 2020-08-27 DIAGNOSIS — D631 Anemia in chronic kidney disease: Secondary | ICD-10-CM | POA: Diagnosis not present

## 2020-08-27 DIAGNOSIS — E538 Deficiency of other specified B group vitamins: Secondary | ICD-10-CM | POA: Diagnosis not present

## 2020-08-27 DIAGNOSIS — E785 Hyperlipidemia, unspecified: Secondary | ICD-10-CM | POA: Diagnosis not present

## 2020-08-27 DIAGNOSIS — I6529 Occlusion and stenosis of unspecified carotid artery: Secondary | ICD-10-CM | POA: Diagnosis not present

## 2020-08-27 DIAGNOSIS — J432 Centrilobular emphysema: Secondary | ICD-10-CM | POA: Diagnosis not present

## 2020-08-27 DIAGNOSIS — G3184 Mild cognitive impairment, so stated: Secondary | ICD-10-CM | POA: Diagnosis not present

## 2020-08-27 DIAGNOSIS — M4697 Unspecified inflammatory spondylopathy, lumbosacral region: Secondary | ICD-10-CM | POA: Diagnosis not present

## 2020-08-27 DIAGNOSIS — H3530 Unspecified macular degeneration: Secondary | ICD-10-CM | POA: Diagnosis not present

## 2020-08-27 DIAGNOSIS — E039 Hypothyroidism, unspecified: Secondary | ICD-10-CM | POA: Diagnosis not present

## 2020-08-27 DIAGNOSIS — L89151 Pressure ulcer of sacral region, stage 1: Secondary | ICD-10-CM | POA: Diagnosis not present

## 2020-08-27 DIAGNOSIS — E1122 Type 2 diabetes mellitus with diabetic chronic kidney disease: Secondary | ICD-10-CM | POA: Diagnosis not present

## 2020-08-27 DIAGNOSIS — E43 Unspecified severe protein-calorie malnutrition: Secondary | ICD-10-CM | POA: Diagnosis not present

## 2020-08-27 DIAGNOSIS — J9611 Chronic respiratory failure with hypoxia: Secondary | ICD-10-CM | POA: Diagnosis not present

## 2020-08-27 DIAGNOSIS — Z981 Arthrodesis status: Secondary | ICD-10-CM | POA: Diagnosis not present

## 2020-08-27 DIAGNOSIS — Z87891 Personal history of nicotine dependence: Secondary | ICD-10-CM | POA: Diagnosis not present

## 2020-08-27 DIAGNOSIS — E1165 Type 2 diabetes mellitus with hyperglycemia: Secondary | ICD-10-CM | POA: Diagnosis not present

## 2020-08-27 DIAGNOSIS — Z9049 Acquired absence of other specified parts of digestive tract: Secondary | ICD-10-CM | POA: Diagnosis not present

## 2020-08-27 NOTE — Telephone Encounter (Signed)
I called several times today but could never get through on the line.  She has an appointment with me tomorrow.

## 2020-08-27 NOTE — Telephone Encounter (Signed)
Patient spouse was informed today by physical therapist from [facility] that there waiting for PCP to sign off on forms regarding patient in home hospital bed. Caller checking on the status, please advise

## 2020-08-28 ENCOUNTER — Encounter: Payer: Self-pay | Admitting: Family Medicine

## 2020-08-28 ENCOUNTER — Ambulatory Visit (INDEPENDENT_AMBULATORY_CARE_PROVIDER_SITE_OTHER): Payer: Medicare Other | Admitting: Family Medicine

## 2020-08-28 ENCOUNTER — Other Ambulatory Visit: Payer: Self-pay

## 2020-08-28 VITALS — BP 126/78 | HR 66 | Temp 98.1°F | Resp 20 | Ht 65.0 in | Wt 126.0 lb

## 2020-08-28 DIAGNOSIS — E440 Moderate protein-calorie malnutrition: Secondary | ICD-10-CM

## 2020-08-28 DIAGNOSIS — N179 Acute kidney failure, unspecified: Secondary | ICD-10-CM

## 2020-08-28 DIAGNOSIS — N1831 Chronic kidney disease, stage 3a: Secondary | ICD-10-CM | POA: Diagnosis not present

## 2020-08-28 DIAGNOSIS — C3411 Malignant neoplasm of upper lobe, right bronchus or lung: Secondary | ICD-10-CM | POA: Diagnosis not present

## 2020-08-28 DIAGNOSIS — E782 Mixed hyperlipidemia: Secondary | ICD-10-CM

## 2020-08-28 DIAGNOSIS — U071 COVID-19: Secondary | ICD-10-CM

## 2020-08-28 DIAGNOSIS — E1122 Type 2 diabetes mellitus with diabetic chronic kidney disease: Secondary | ICD-10-CM

## 2020-08-28 DIAGNOSIS — E1142 Type 2 diabetes mellitus with diabetic polyneuropathy: Secondary | ICD-10-CM | POA: Diagnosis not present

## 2020-08-28 NOTE — Telephone Encounter (Signed)
Forms are in your stack to review.

## 2020-08-28 NOTE — Progress Notes (Signed)
Established patient visit   Patient: [name] [name]   DOB: 1940/09/14   80 y.o. Female  MRN: [id number] Visit Date: 08/28/2020  Today's healthcare provider: [name], MD   Chief Complaint  Patient presents with  . Hospitalization Follow-up   Subjective    HPI  Patient comes in today for hospital follow-up for COVID infection.  She is slowly getting her level of energy back which is already low.  She continues to have a cough which is nonproductive and she has no additional dyspnea Follow up Hospitalization  Patient was admitted to [facility] on 07/31/2020 and discharged on 08/02/2020. She was treated for weakness and testing positive for COVID. Treatment for this included starting remdesivir and isolate x 10 days. Also stop glimepiride due to hypoglycemia.    She reports fair compliance with treatment. She reports this condition is improved. However, patient reports that she still has a productive cough. She is also feeling weak. She and her husband fell in the yard this afternoon on the way to this appt.        Medications: Outpatient Medications Prior to Visit  Medication Sig  . Accu-Chek FastClix Lancets MISC Check blood sugar up to 4 times daily.  Marland Kitchen albuterol (VENTOLIN HFA) 108 (90 Base) MCG/ACT inhaler Inhale 2 puffs into the lungs every 4 (four) hours as needed for wheezing or shortness of breath.  Marland Kitchen amLODipine (NORVASC) 5 MG tablet TAKE 1 TABLET BY MOUTH EVERY DAY  . aspirin EC 81 MG EC tablet Take 1 tablet (81 mg total) by mouth daily.  Marland Kitchen atorvastatin (LIPITOR) 40 MG tablet TAKE 1 TABLET BY MOUTH  DAILY AT 6 PM  . Blood Glucose Monitoring Suppl (ACCU-CHEK GUIDE) w/Device KIT USE AS DIRECTED  . clopidogrel (PLAVIX) 75 MG tablet TAKE 1 TABLET BY MOUTH  DAILY  . donepezil (ARICEPT) 5 MG tablet TAKE 1 TABLET BY MOUTH EVERYDAY AT BEDTIME  . doxycycline (VIBRA-TABS) 100 MG tablet Take 1 tablet (100 mg total) by mouth 2 (two) times daily. (Patient not taking: Reported  on 08/28/2020)  . gabapentin (NEURONTIN) 600 MG tablet TAKE ONE-HALF TABLET BY  MOUTH TWICE DAILY  . glucose blood (ACCU-CHEK GUIDE) test strip CHECK BLOOD SUGAR ONCE  DAILY  . ipratropium-albuterol (DUONEB) 0.5-2.5 (3) MG/3ML SOLN Take 3 mLs by nebulization every 4 (four) hours as needed.  . isosorbide mononitrate (IMDUR) 60 MG 24 hr tablet TAKE 1 TABLET BY MOUTH  DAILY  . magnesium oxide (MAG-OX) 400 (241.3 MG) MG tablet Take 400 mg by mouth 2 (two) times daily.   . metoprolol succinate (TOPROL-XL) 100 MG 24 hr tablet Take 1 tablet (100 mg total) by mouth daily.  . mometasone (ELOCON) 0.1 % lotion Apply 4 drops topically at bedtime as needed (dry skin). (Patient not taking: No sig reported)  . Multiple Vitamin (MULTIVITAMIN) tablet Take 0.5 tablets by mouth 2 (two) times daily.  . Multiple Vitamins-Minerals (ICAPS) CAPS Take 1 capsule by mouth 2 (two) times daily.   . nitroGLYCERIN (NITROSTAT) 0.4 MG SL tablet PLACE 1 TABLET UNDER THE TONGUE EVERY 5 MINUTES AS NEEDED FOR CHEST PAIN  . pantoprazole (PROTONIX) 40 MG tablet TAKE 1 TABLET BY MOUTH  DAILY  . predniSONE (DELTASONE) 20 MG tablet Take 1 tablet (20 mg total) by mouth daily with breakfast.  . protective barrier (RESTORE) CREA Apple twice daily.  Marland Kitchen VITAMIN D PO Take by mouth daily.   . vitamin E 180 MG (400 UNITS) capsule Take 400  Units by mouth daily.  . Wound Dressings (COMFEEL PLUS ULCER DRESSING) PADS Apply once daily as needed.  . zinc sulfate 220 (50 Zn) MG capsule Take 220 mg by mouth daily.   Facility-Administered Medications Prior to Visit  Medication Dose Route Frequency Provider  . ondansetron (ZOFRAN) 8 mg, dexamethasone (DECADRON) 10 mg in sodium chloride 0.9 % 50 mL IVPB   Intravenous Once Lloyd Huger, MD    Review of Systems  Constitutional: Positive for activity change and fatigue.  HENT: Positive for congestion.   Respiratory: Positive for cough and shortness of breath.   Cardiovascular: Positive for leg  swelling. Negative for chest pain and palpitations.  Endocrine: Negative for cold intolerance, heat intolerance, polydipsia, polyphagia and polyuria.  Musculoskeletal: Positive for arthralgias.  Neurological: Negative for dizziness, light-headedness and headaches.  Hematological: Bruises/bleeds easily.        Objective    BP 126/78   Pulse 66   Temp 98.1 F (36.7 C)   Resp 20   Ht 5' 5" (1.651 m)   Wt 126 lb (57.2 kg) Comment: per pt  SpO2 100% Comment: w/ 2L of O2  BMI 20.97 kg/m  BP Readings from Last 3 Encounters:  08/28/20 126/78  08/02/20 (!) 118/57  07/12/20 (!) 110/53   Wt Readings from Last 3 Encounters:  08/28/20 126 lb (57.2 kg)  07/31/20 131 lb 8 oz (59.6 kg)  04/16/20 120 lb (54.4 kg)       Physical Exam Vitals and nursing note reviewed.  Constitutional:      General: She is not in acute distress.    Comments: She is a cachectic white female in no acute distress.  Cardiovascular:     Rate and Rhythm: Normal rate and regular rhythm.     Pulses: Normal pulses.     Heart sounds: Normal heart sounds.  Pulmonary:     Effort: Pulmonary effort is normal.     Breath sounds: Normal breath sounds.  Abdominal:     General: Bowel sounds are normal.     Palpations: Abdomen is soft.  Musculoskeletal:     Cervical back: Normal range of motion and neck supple.     Right lower leg: No edema.     Left lower leg: No edema.  Skin:    General: Skin is warm and dry.  Neurological:     General: No focal deficit present.     Mental Status: She is alert and oriented to person, place, and time. Mental status is at baseline.  Psychiatric:        Mood and Affect: Mood normal.        Behavior: Behavior normal.       No results found for any visits on 08/28/20.  Assessment & Plan     1. COVID-19 Recent COVID-19 infection.  Is improving.  Push fluids and encourage Robitussin twice a day until symptoms are better.  2. Primary cancer of right upper lobe of lung  ([place]) Followed by oncology  3. Diabetic polyneuropathy associated with type 2 diabetes mellitus (HCC) Last A1c a month ago was 6.0.  4. Type 2 diabetes mellitus with stage 3a chronic kidney disease, without long-term current use of insulin ([place])   5. AKI (acute kidney injury) ([place]) Continue to push fluids to protect further renal damage.  6. Mixed hyperlipidemia On Lipitor  7. Malnutrition of moderate degree Encourage patient to eat any food that she can manage to get out   No follow-ups on file.  I, [name], MD, have reviewed all documentation for this visit. The documentation on 09/02/20 for the exam, diagnosis, procedures, and orders are all accurate and complete.    Mare Ludtke Cranford Mon, MD  [facility] [phone] (phone) [phone] (fax)  [place]

## 2020-08-28 NOTE — Telephone Encounter (Signed)
Pt seen in the office today.

## 2020-08-28 NOTE — Telephone Encounter (Signed)
lmtcb need clarification from patient or her spouse if this appointment needs to be changed to virtual. Amparo Bristol

## 2020-08-28 NOTE — Patient Instructions (Signed)
Push fluids.   Start Robitussin twice a day.

## 2020-08-29 DIAGNOSIS — L89151 Pressure ulcer of sacral region, stage 1: Secondary | ICD-10-CM | POA: Diagnosis not present

## 2020-08-29 DIAGNOSIS — E785 Hyperlipidemia, unspecified: Secondary | ICD-10-CM | POA: Diagnosis not present

## 2020-08-29 DIAGNOSIS — D631 Anemia in chronic kidney disease: Secondary | ICD-10-CM | POA: Diagnosis not present

## 2020-08-29 DIAGNOSIS — Z9049 Acquired absence of other specified parts of digestive tract: Secondary | ICD-10-CM | POA: Diagnosis not present

## 2020-08-29 DIAGNOSIS — E43 Unspecified severe protein-calorie malnutrition: Secondary | ICD-10-CM | POA: Diagnosis not present

## 2020-08-29 DIAGNOSIS — M4697 Unspecified inflammatory spondylopathy, lumbosacral region: Secondary | ICD-10-CM | POA: Diagnosis not present

## 2020-08-29 DIAGNOSIS — J432 Centrilobular emphysema: Secondary | ICD-10-CM | POA: Diagnosis not present

## 2020-08-29 DIAGNOSIS — E1142 Type 2 diabetes mellitus with diabetic polyneuropathy: Secondary | ICD-10-CM | POA: Diagnosis not present

## 2020-08-29 DIAGNOSIS — E1122 Type 2 diabetes mellitus with diabetic chronic kidney disease: Secondary | ICD-10-CM | POA: Diagnosis not present

## 2020-08-29 DIAGNOSIS — U071 COVID-19: Secondary | ICD-10-CM | POA: Diagnosis not present

## 2020-08-29 DIAGNOSIS — I6529 Occlusion and stenosis of unspecified carotid artery: Secondary | ICD-10-CM | POA: Diagnosis not present

## 2020-08-29 DIAGNOSIS — M4722 Other spondylosis with radiculopathy, cervical region: Secondary | ICD-10-CM | POA: Diagnosis not present

## 2020-08-29 DIAGNOSIS — Z87891 Personal history of nicotine dependence: Secondary | ICD-10-CM | POA: Diagnosis not present

## 2020-08-29 DIAGNOSIS — E538 Deficiency of other specified B group vitamins: Secondary | ICD-10-CM | POA: Diagnosis not present

## 2020-08-29 DIAGNOSIS — N183 Chronic kidney disease, stage 3 unspecified: Secondary | ICD-10-CM | POA: Diagnosis not present

## 2020-08-29 DIAGNOSIS — H3530 Unspecified macular degeneration: Secondary | ICD-10-CM | POA: Diagnosis not present

## 2020-08-29 DIAGNOSIS — E039 Hypothyroidism, unspecified: Secondary | ICD-10-CM | POA: Diagnosis not present

## 2020-08-29 DIAGNOSIS — J9611 Chronic respiratory failure with hypoxia: Secondary | ICD-10-CM | POA: Diagnosis not present

## 2020-08-29 DIAGNOSIS — I2510 Atherosclerotic heart disease of native coronary artery without angina pectoris: Secondary | ICD-10-CM | POA: Diagnosis not present

## 2020-08-29 DIAGNOSIS — M109 Gout, unspecified: Secondary | ICD-10-CM | POA: Diagnosis not present

## 2020-08-29 DIAGNOSIS — E1165 Type 2 diabetes mellitus with hyperglycemia: Secondary | ICD-10-CM | POA: Diagnosis not present

## 2020-08-29 DIAGNOSIS — Z981 Arthrodesis status: Secondary | ICD-10-CM | POA: Diagnosis not present

## 2020-08-29 DIAGNOSIS — G3184 Mild cognitive impairment, so stated: Secondary | ICD-10-CM | POA: Diagnosis not present

## 2020-08-29 DIAGNOSIS — I129 Hypertensive chronic kidney disease with stage 1 through stage 4 chronic kidney disease, or unspecified chronic kidney disease: Secondary | ICD-10-CM | POA: Diagnosis not present

## 2020-08-29 DIAGNOSIS — Z8673 Personal history of transient ischemic attack (TIA), and cerebral infarction without residual deficits: Secondary | ICD-10-CM | POA: Diagnosis not present

## 2020-09-04 ENCOUNTER — Other Ambulatory Visit: Payer: Self-pay | Admitting: Family Medicine

## 2020-09-04 DIAGNOSIS — E1342 Other specified diabetes mellitus with diabetic polyneuropathy: Secondary | ICD-10-CM

## 2020-09-04 DIAGNOSIS — M79604 Pain in right leg: Secondary | ICD-10-CM

## 2020-09-04 NOTE — Telephone Encounter (Signed)
Requested Prescriptions  Pending Prescriptions Disp Refills  . glimepiride (AMARYL) 2 MG tablet [Pharmacy Med Name: Glimepiride 2 MG Oral Tablet] 180 tablet 3    Sig: TAKE 1 TABLET BY MOUTH  TWICE DAILY     Endocrinology:  Diabetes - Sulfonylureas Passed - 09/04/2020 10:03 PM      Passed - HBA1C is between 0 and 7.9 and within 180 days    Hgb A1c MFr Bld  Date Value Ref Range Status  07/31/2020 6.0 (H) 4.8 - 5.6 % Final    Comment:    (NOTE) Pre diabetes:          5.7%-6.4%  Diabetes:              >6.4%  Glycemic control for   <7.0% adults with diabetes          Passed - Valid encounter within last 6 months    Recent Outpatient Visits          1 week ago [place] Rosanna Randy, Retia Passe., MD   1 month ago Skin ulcer of sacrum, limited to breakdown of skin [facility])   [facility] [place], [place], [place]   4 months ago Encounter for immunization   [facility] Rosanna Randy, Retia Passe., MD   5 months ago Diabetic polyneuropathy associated with type 2 diabetes mellitus [facility])   [facility] Jerrol Banana., MD   7 months ago Chronic obstructive pulmonary disease with acute exacerbation [facility])   [facility] Jerrol Banana., MD      Future Appointments            In 2 months Jerrol Banana., MD [facility]           . gabapentin (NEURONTIN) 600 MG tablet [Pharmacy Med Name: Gabapentin 600 MG Oral Tablet] 90 tablet 0    Sig: TAKE ONE-HALF TABLET BY  MOUTH TWICE DAILY     Neurology: Anticonvulsants - gabapentin Passed - 09/04/2020 10:03 PM      Passed - Valid encounter within last 12 months    Recent Outpatient Visits          1 week ago [place] Rosanna Randy, Retia Passe., MD   1 month ago Skin ulcer of sacrum, limited to breakdown of skin [facility])   [facility] [place], [place], [place]   4 months ago Encounter for  immunization   [facility] Rosanna Randy, Retia Passe., MD   5 months ago Diabetic polyneuropathy associated with type 2 diabetes mellitus [facility])   [facility] Jerrol Banana., MD   7 months ago Chronic obstructive pulmonary disease with acute exacerbation [facility])   [facility] Jerrol Banana., MD      Future Appointments            In 2 months Jerrol Banana., MD [facility], [facility]

## 2020-09-05 ENCOUNTER — Telehealth: Payer: Self-pay | Admitting: Family Medicine

## 2020-09-05 DIAGNOSIS — Z87891 Personal history of nicotine dependence: Secondary | ICD-10-CM | POA: Diagnosis not present

## 2020-09-05 DIAGNOSIS — M4722 Other spondylosis with radiculopathy, cervical region: Secondary | ICD-10-CM | POA: Diagnosis not present

## 2020-09-05 DIAGNOSIS — E43 Unspecified severe protein-calorie malnutrition: Secondary | ICD-10-CM | POA: Diagnosis not present

## 2020-09-05 DIAGNOSIS — E785 Hyperlipidemia, unspecified: Secondary | ICD-10-CM | POA: Diagnosis not present

## 2020-09-05 DIAGNOSIS — E039 Hypothyroidism, unspecified: Secondary | ICD-10-CM | POA: Diagnosis not present

## 2020-09-05 DIAGNOSIS — U071 COVID-19: Secondary | ICD-10-CM | POA: Diagnosis not present

## 2020-09-05 DIAGNOSIS — L89151 Pressure ulcer of sacral region, stage 1: Secondary | ICD-10-CM | POA: Diagnosis not present

## 2020-09-05 DIAGNOSIS — Z981 Arthrodesis status: Secondary | ICD-10-CM | POA: Diagnosis not present

## 2020-09-05 DIAGNOSIS — M4697 Unspecified inflammatory spondylopathy, lumbosacral region: Secondary | ICD-10-CM | POA: Diagnosis not present

## 2020-09-05 DIAGNOSIS — N183 Chronic kidney disease, stage 3 unspecified: Secondary | ICD-10-CM | POA: Diagnosis not present

## 2020-09-05 DIAGNOSIS — G3184 Mild cognitive impairment, so stated: Secondary | ICD-10-CM | POA: Diagnosis not present

## 2020-09-05 DIAGNOSIS — E1165 Type 2 diabetes mellitus with hyperglycemia: Secondary | ICD-10-CM | POA: Diagnosis not present

## 2020-09-05 DIAGNOSIS — I129 Hypertensive chronic kidney disease with stage 1 through stage 4 chronic kidney disease, or unspecified chronic kidney disease: Secondary | ICD-10-CM | POA: Diagnosis not present

## 2020-09-05 DIAGNOSIS — M109 Gout, unspecified: Secondary | ICD-10-CM | POA: Diagnosis not present

## 2020-09-05 DIAGNOSIS — E1142 Type 2 diabetes mellitus with diabetic polyneuropathy: Secondary | ICD-10-CM | POA: Diagnosis not present

## 2020-09-05 DIAGNOSIS — D631 Anemia in chronic kidney disease: Secondary | ICD-10-CM | POA: Diagnosis not present

## 2020-09-05 DIAGNOSIS — J432 Centrilobular emphysema: Secondary | ICD-10-CM | POA: Diagnosis not present

## 2020-09-05 DIAGNOSIS — Z9049 Acquired absence of other specified parts of digestive tract: Secondary | ICD-10-CM | POA: Diagnosis not present

## 2020-09-05 DIAGNOSIS — E538 Deficiency of other specified B group vitamins: Secondary | ICD-10-CM | POA: Diagnosis not present

## 2020-09-05 DIAGNOSIS — I2510 Atherosclerotic heart disease of native coronary artery without angina pectoris: Secondary | ICD-10-CM | POA: Diagnosis not present

## 2020-09-05 DIAGNOSIS — H3530 Unspecified macular degeneration: Secondary | ICD-10-CM | POA: Diagnosis not present

## 2020-09-05 DIAGNOSIS — E1122 Type 2 diabetes mellitus with diabetic chronic kidney disease: Secondary | ICD-10-CM | POA: Diagnosis not present

## 2020-09-05 DIAGNOSIS — Z8673 Personal history of transient ischemic attack (TIA), and cerebral infarction without residual deficits: Secondary | ICD-10-CM | POA: Diagnosis not present

## 2020-09-05 DIAGNOSIS — I6529 Occlusion and stenosis of unspecified carotid artery: Secondary | ICD-10-CM | POA: Diagnosis not present

## 2020-09-05 DIAGNOSIS — J9611 Chronic respiratory failure with hypoxia: Secondary | ICD-10-CM | POA: Diagnosis not present

## 2020-09-05 NOTE — Telephone Encounter (Signed)
Home Health Verbal Orders - Caller/Agency: Madison Hickman  Callback Number: [phone] Requesting extend home PT Frequency: 1x a week for 2 weeks to help with airway clearance  Pts right hand 3rd finger is red, swollen, bruised and tender to touch/ pt doesn't wash her hands much / this is relatively new and Tillie Rung didn't know if Dr. Rosanna Randy wants to look at this asap /  They also will put her on a two week plan for discharge due to not using her albuterol regularly and not using her walker

## 2020-09-06 DIAGNOSIS — J9611 Chronic respiratory failure with hypoxia: Secondary | ICD-10-CM | POA: Diagnosis not present

## 2020-09-06 DIAGNOSIS — E538 Deficiency of other specified B group vitamins: Secondary | ICD-10-CM | POA: Diagnosis not present

## 2020-09-06 DIAGNOSIS — D631 Anemia in chronic kidney disease: Secondary | ICD-10-CM | POA: Diagnosis not present

## 2020-09-06 DIAGNOSIS — I2510 Atherosclerotic heart disease of native coronary artery without angina pectoris: Secondary | ICD-10-CM | POA: Diagnosis not present

## 2020-09-06 DIAGNOSIS — I129 Hypertensive chronic kidney disease with stage 1 through stage 4 chronic kidney disease, or unspecified chronic kidney disease: Secondary | ICD-10-CM | POA: Diagnosis not present

## 2020-09-06 DIAGNOSIS — Z8673 Personal history of transient ischemic attack (TIA), and cerebral infarction without residual deficits: Secondary | ICD-10-CM | POA: Diagnosis not present

## 2020-09-06 DIAGNOSIS — Z9049 Acquired absence of other specified parts of digestive tract: Secondary | ICD-10-CM | POA: Diagnosis not present

## 2020-09-06 DIAGNOSIS — G3184 Mild cognitive impairment, so stated: Secondary | ICD-10-CM | POA: Diagnosis not present

## 2020-09-06 DIAGNOSIS — L89151 Pressure ulcer of sacral region, stage 1: Secondary | ICD-10-CM | POA: Diagnosis not present

## 2020-09-06 DIAGNOSIS — Z87891 Personal history of nicotine dependence: Secondary | ICD-10-CM | POA: Diagnosis not present

## 2020-09-06 DIAGNOSIS — H3530 Unspecified macular degeneration: Secondary | ICD-10-CM | POA: Diagnosis not present

## 2020-09-06 DIAGNOSIS — E1165 Type 2 diabetes mellitus with hyperglycemia: Secondary | ICD-10-CM | POA: Diagnosis not present

## 2020-09-06 DIAGNOSIS — M109 Gout, unspecified: Secondary | ICD-10-CM | POA: Diagnosis not present

## 2020-09-06 DIAGNOSIS — E43 Unspecified severe protein-calorie malnutrition: Secondary | ICD-10-CM | POA: Diagnosis not present

## 2020-09-06 DIAGNOSIS — U071 COVID-19: Secondary | ICD-10-CM | POA: Diagnosis not present

## 2020-09-06 DIAGNOSIS — Z9181 History of falling: Secondary | ICD-10-CM | POA: Diagnosis not present

## 2020-09-06 DIAGNOSIS — N183 Chronic kidney disease, stage 3 unspecified: Secondary | ICD-10-CM | POA: Diagnosis not present

## 2020-09-06 DIAGNOSIS — E785 Hyperlipidemia, unspecified: Secondary | ICD-10-CM | POA: Diagnosis not present

## 2020-09-06 DIAGNOSIS — I6529 Occlusion and stenosis of unspecified carotid artery: Secondary | ICD-10-CM | POA: Diagnosis not present

## 2020-09-06 DIAGNOSIS — M4697 Unspecified inflammatory spondylopathy, lumbosacral region: Secondary | ICD-10-CM | POA: Diagnosis not present

## 2020-09-06 DIAGNOSIS — J432 Centrilobular emphysema: Secondary | ICD-10-CM | POA: Diagnosis not present

## 2020-09-06 DIAGNOSIS — E039 Hypothyroidism, unspecified: Secondary | ICD-10-CM | POA: Diagnosis not present

## 2020-09-06 DIAGNOSIS — E1142 Type 2 diabetes mellitus with diabetic polyneuropathy: Secondary | ICD-10-CM | POA: Diagnosis not present

## 2020-09-06 DIAGNOSIS — E1122 Type 2 diabetes mellitus with diabetic chronic kidney disease: Secondary | ICD-10-CM | POA: Diagnosis not present

## 2020-09-06 DIAGNOSIS — Z981 Arthrodesis status: Secondary | ICD-10-CM | POA: Diagnosis not present

## 2020-09-06 DIAGNOSIS — M4722 Other spondylosis with radiculopathy, cervical region: Secondary | ICD-10-CM | POA: Diagnosis not present

## 2020-09-06 NOTE — Telephone Encounter (Signed)
Patient's husband advised to schedule a visit for treatment of possible finger infection. He states that they don't have time today and that he will call back later on for appointment.

## 2020-09-06 NOTE — Telephone Encounter (Signed)
[place] for [employer] [facility]. Do recommend a visit vs [facility] virtual visit for the finger.

## 2020-09-06 NOTE — Telephone Encounter (Signed)
Tillie Rung advised of approved verbal order.

## 2020-09-10 ENCOUNTER — Other Ambulatory Visit: Payer: Self-pay | Admitting: Family Medicine

## 2020-09-10 ENCOUNTER — Telehealth: Payer: Self-pay | Admitting: Family Medicine

## 2020-09-10 DIAGNOSIS — M4697 Unspecified inflammatory spondylopathy, lumbosacral region: Secondary | ICD-10-CM | POA: Diagnosis not present

## 2020-09-10 DIAGNOSIS — E538 Deficiency of other specified B group vitamins: Secondary | ICD-10-CM | POA: Diagnosis not present

## 2020-09-10 DIAGNOSIS — R059 Cough, unspecified: Secondary | ICD-10-CM

## 2020-09-10 DIAGNOSIS — E43 Unspecified severe protein-calorie malnutrition: Secondary | ICD-10-CM | POA: Diagnosis not present

## 2020-09-10 DIAGNOSIS — M4722 Other spondylosis with radiculopathy, cervical region: Secondary | ICD-10-CM | POA: Diagnosis not present

## 2020-09-10 DIAGNOSIS — E1165 Type 2 diabetes mellitus with hyperglycemia: Secondary | ICD-10-CM | POA: Diagnosis not present

## 2020-09-10 DIAGNOSIS — G3184 Mild cognitive impairment, so stated: Secondary | ICD-10-CM | POA: Diagnosis not present

## 2020-09-10 DIAGNOSIS — J9611 Chronic respiratory failure with hypoxia: Secondary | ICD-10-CM | POA: Diagnosis not present

## 2020-09-10 DIAGNOSIS — U071 COVID-19: Secondary | ICD-10-CM | POA: Diagnosis not present

## 2020-09-10 DIAGNOSIS — E039 Hypothyroidism, unspecified: Secondary | ICD-10-CM | POA: Diagnosis not present

## 2020-09-10 DIAGNOSIS — I10 Essential (primary) hypertension: Secondary | ICD-10-CM

## 2020-09-10 DIAGNOSIS — M109 Gout, unspecified: Secondary | ICD-10-CM | POA: Diagnosis not present

## 2020-09-10 DIAGNOSIS — L89151 Pressure ulcer of sacral region, stage 1: Secondary | ICD-10-CM | POA: Diagnosis not present

## 2020-09-10 DIAGNOSIS — Z9049 Acquired absence of other specified parts of digestive tract: Secondary | ICD-10-CM | POA: Diagnosis not present

## 2020-09-10 DIAGNOSIS — I129 Hypertensive chronic kidney disease with stage 1 through stage 4 chronic kidney disease, or unspecified chronic kidney disease: Secondary | ICD-10-CM | POA: Diagnosis not present

## 2020-09-10 DIAGNOSIS — R0603 Acute respiratory distress: Secondary | ICD-10-CM

## 2020-09-10 DIAGNOSIS — Z87891 Personal history of nicotine dependence: Secondary | ICD-10-CM | POA: Diagnosis not present

## 2020-09-10 DIAGNOSIS — E1122 Type 2 diabetes mellitus with diabetic chronic kidney disease: Secondary | ICD-10-CM | POA: Diagnosis not present

## 2020-09-10 DIAGNOSIS — E1142 Type 2 diabetes mellitus with diabetic polyneuropathy: Secondary | ICD-10-CM | POA: Diagnosis not present

## 2020-09-10 DIAGNOSIS — J432 Centrilobular emphysema: Secondary | ICD-10-CM | POA: Diagnosis not present

## 2020-09-10 DIAGNOSIS — H3530 Unspecified macular degeneration: Secondary | ICD-10-CM | POA: Diagnosis not present

## 2020-09-10 DIAGNOSIS — K2100 Gastro-esophageal reflux disease with esophagitis, without bleeding: Secondary | ICD-10-CM

## 2020-09-10 DIAGNOSIS — E785 Hyperlipidemia, unspecified: Secondary | ICD-10-CM | POA: Diagnosis not present

## 2020-09-10 DIAGNOSIS — Z981 Arthrodesis status: Secondary | ICD-10-CM | POA: Diagnosis not present

## 2020-09-10 DIAGNOSIS — D631 Anemia in chronic kidney disease: Secondary | ICD-10-CM | POA: Diagnosis not present

## 2020-09-10 DIAGNOSIS — N183 Chronic kidney disease, stage 3 unspecified: Secondary | ICD-10-CM | POA: Diagnosis not present

## 2020-09-10 DIAGNOSIS — I6529 Occlusion and stenosis of unspecified carotid artery: Secondary | ICD-10-CM | POA: Diagnosis not present

## 2020-09-10 DIAGNOSIS — Z8673 Personal history of transient ischemic attack (TIA), and cerebral infarction without residual deficits: Secondary | ICD-10-CM | POA: Diagnosis not present

## 2020-09-10 DIAGNOSIS — I2510 Atherosclerotic heart disease of native coronary artery without angina pectoris: Secondary | ICD-10-CM | POA: Diagnosis not present

## 2020-09-10 MED ORDER — IPRATROPIUM-ALBUTEROL 0.5-2.5 (3) MG/3ML IN SOLN: 3.0000 mL | RESPIRATORY_TRACT | 1 refills | Status: DC | PRN

## 2020-09-10 NOTE — Telephone Encounter (Signed)
Medication sent in. 

## 2020-09-10 NOTE — Telephone Encounter (Signed)
[facility] faxed refill request for the following medications:  ipratropium-albuterol (DUONEB) 0.5-2.5 (3) MG/3ML SOLN  Last Rx: 06/06/19 LOV: 08/28/20 Please advise. Thanks TNP

## 2020-09-13 DIAGNOSIS — L89151 Pressure ulcer of sacral region, stage 1: Secondary | ICD-10-CM | POA: Diagnosis not present

## 2020-09-13 DIAGNOSIS — J432 Centrilobular emphysema: Secondary | ICD-10-CM | POA: Diagnosis not present

## 2020-09-13 DIAGNOSIS — J9611 Chronic respiratory failure with hypoxia: Secondary | ICD-10-CM | POA: Diagnosis not present

## 2020-09-13 DIAGNOSIS — E785 Hyperlipidemia, unspecified: Secondary | ICD-10-CM | POA: Diagnosis not present

## 2020-09-13 DIAGNOSIS — E1142 Type 2 diabetes mellitus with diabetic polyneuropathy: Secondary | ICD-10-CM | POA: Diagnosis not present

## 2020-09-13 DIAGNOSIS — M4697 Unspecified inflammatory spondylopathy, lumbosacral region: Secondary | ICD-10-CM | POA: Diagnosis not present

## 2020-09-13 DIAGNOSIS — Z87891 Personal history of nicotine dependence: Secondary | ICD-10-CM | POA: Diagnosis not present

## 2020-09-13 DIAGNOSIS — N183 Chronic kidney disease, stage 3 unspecified: Secondary | ICD-10-CM | POA: Diagnosis not present

## 2020-09-13 DIAGNOSIS — E1165 Type 2 diabetes mellitus with hyperglycemia: Secondary | ICD-10-CM | POA: Diagnosis not present

## 2020-09-13 DIAGNOSIS — Z8673 Personal history of transient ischemic attack (TIA), and cerebral infarction without residual deficits: Secondary | ICD-10-CM | POA: Diagnosis not present

## 2020-09-13 DIAGNOSIS — I129 Hypertensive chronic kidney disease with stage 1 through stage 4 chronic kidney disease, or unspecified chronic kidney disease: Secondary | ICD-10-CM | POA: Diagnosis not present

## 2020-09-13 DIAGNOSIS — I6529 Occlusion and stenosis of unspecified carotid artery: Secondary | ICD-10-CM | POA: Diagnosis not present

## 2020-09-13 DIAGNOSIS — G3184 Mild cognitive impairment, so stated: Secondary | ICD-10-CM | POA: Diagnosis not present

## 2020-09-13 DIAGNOSIS — E039 Hypothyroidism, unspecified: Secondary | ICD-10-CM | POA: Diagnosis not present

## 2020-09-13 DIAGNOSIS — Z981 Arthrodesis status: Secondary | ICD-10-CM | POA: Diagnosis not present

## 2020-09-13 DIAGNOSIS — I2510 Atherosclerotic heart disease of native coronary artery without angina pectoris: Secondary | ICD-10-CM | POA: Diagnosis not present

## 2020-09-13 DIAGNOSIS — E538 Deficiency of other specified B group vitamins: Secondary | ICD-10-CM | POA: Diagnosis not present

## 2020-09-13 DIAGNOSIS — H3530 Unspecified macular degeneration: Secondary | ICD-10-CM | POA: Diagnosis not present

## 2020-09-13 DIAGNOSIS — M109 Gout, unspecified: Secondary | ICD-10-CM | POA: Diagnosis not present

## 2020-09-13 DIAGNOSIS — Z9049 Acquired absence of other specified parts of digestive tract: Secondary | ICD-10-CM | POA: Diagnosis not present

## 2020-09-13 DIAGNOSIS — U071 COVID-19: Secondary | ICD-10-CM | POA: Diagnosis not present

## 2020-09-13 DIAGNOSIS — M4722 Other spondylosis with radiculopathy, cervical region: Secondary | ICD-10-CM | POA: Diagnosis not present

## 2020-09-13 DIAGNOSIS — E43 Unspecified severe protein-calorie malnutrition: Secondary | ICD-10-CM | POA: Diagnosis not present

## 2020-09-13 DIAGNOSIS — D631 Anemia in chronic kidney disease: Secondary | ICD-10-CM | POA: Diagnosis not present

## 2020-09-13 DIAGNOSIS — E1122 Type 2 diabetes mellitus with diabetic chronic kidney disease: Secondary | ICD-10-CM | POA: Diagnosis not present

## 2020-09-16 DIAGNOSIS — I6529 Occlusion and stenosis of unspecified carotid artery: Secondary | ICD-10-CM | POA: Diagnosis not present

## 2020-09-16 DIAGNOSIS — E1142 Type 2 diabetes mellitus with diabetic polyneuropathy: Secondary | ICD-10-CM | POA: Diagnosis not present

## 2020-09-16 DIAGNOSIS — D631 Anemia in chronic kidney disease: Secondary | ICD-10-CM | POA: Diagnosis not present

## 2020-09-16 DIAGNOSIS — H3530 Unspecified macular degeneration: Secondary | ICD-10-CM | POA: Diagnosis not present

## 2020-09-16 DIAGNOSIS — M109 Gout, unspecified: Secondary | ICD-10-CM | POA: Diagnosis not present

## 2020-09-16 DIAGNOSIS — E039 Hypothyroidism, unspecified: Secondary | ICD-10-CM | POA: Diagnosis not present

## 2020-09-16 DIAGNOSIS — Z981 Arthrodesis status: Secondary | ICD-10-CM | POA: Diagnosis not present

## 2020-09-16 DIAGNOSIS — U071 COVID-19: Secondary | ICD-10-CM | POA: Diagnosis not present

## 2020-09-16 DIAGNOSIS — N183 Chronic kidney disease, stage 3 unspecified: Secondary | ICD-10-CM | POA: Diagnosis not present

## 2020-09-16 DIAGNOSIS — E1122 Type 2 diabetes mellitus with diabetic chronic kidney disease: Secondary | ICD-10-CM | POA: Diagnosis not present

## 2020-09-16 DIAGNOSIS — J9611 Chronic respiratory failure with hypoxia: Secondary | ICD-10-CM | POA: Diagnosis not present

## 2020-09-16 DIAGNOSIS — Z9049 Acquired absence of other specified parts of digestive tract: Secondary | ICD-10-CM | POA: Diagnosis not present

## 2020-09-16 DIAGNOSIS — L89151 Pressure ulcer of sacral region, stage 1: Secondary | ICD-10-CM | POA: Diagnosis not present

## 2020-09-16 DIAGNOSIS — I2510 Atherosclerotic heart disease of native coronary artery without angina pectoris: Secondary | ICD-10-CM | POA: Diagnosis not present

## 2020-09-16 DIAGNOSIS — I129 Hypertensive chronic kidney disease with stage 1 through stage 4 chronic kidney disease, or unspecified chronic kidney disease: Secondary | ICD-10-CM | POA: Diagnosis not present

## 2020-09-16 DIAGNOSIS — Z87891 Personal history of nicotine dependence: Secondary | ICD-10-CM | POA: Diagnosis not present

## 2020-09-16 DIAGNOSIS — G3184 Mild cognitive impairment, so stated: Secondary | ICD-10-CM | POA: Diagnosis not present

## 2020-09-16 DIAGNOSIS — E538 Deficiency of other specified B group vitamins: Secondary | ICD-10-CM | POA: Diagnosis not present

## 2020-09-16 DIAGNOSIS — J432 Centrilobular emphysema: Secondary | ICD-10-CM | POA: Diagnosis not present

## 2020-09-16 DIAGNOSIS — M4697 Unspecified inflammatory spondylopathy, lumbosacral region: Secondary | ICD-10-CM | POA: Diagnosis not present

## 2020-09-16 DIAGNOSIS — E1165 Type 2 diabetes mellitus with hyperglycemia: Secondary | ICD-10-CM | POA: Diagnosis not present

## 2020-09-16 DIAGNOSIS — Z8673 Personal history of transient ischemic attack (TIA), and cerebral infarction without residual deficits: Secondary | ICD-10-CM | POA: Diagnosis not present

## 2020-09-16 DIAGNOSIS — E785 Hyperlipidemia, unspecified: Secondary | ICD-10-CM | POA: Diagnosis not present

## 2020-09-16 DIAGNOSIS — M4722 Other spondylosis with radiculopathy, cervical region: Secondary | ICD-10-CM | POA: Diagnosis not present

## 2020-09-16 DIAGNOSIS — E43 Unspecified severe protein-calorie malnutrition: Secondary | ICD-10-CM | POA: Diagnosis not present

## 2020-09-17 ENCOUNTER — Ambulatory Visit (INDEPENDENT_AMBULATORY_CARE_PROVIDER_SITE_OTHER): Payer: Medicare Other | Admitting: Family Medicine

## 2020-09-17 DIAGNOSIS — J441 Chronic obstructive pulmonary disease with (acute) exacerbation: Secondary | ICD-10-CM

## 2020-09-17 DIAGNOSIS — R059 Cough, unspecified: Secondary | ICD-10-CM

## 2020-09-17 DIAGNOSIS — L03019 Cellulitis of unspecified finger: Secondary | ICD-10-CM

## 2020-09-17 MED ORDER — PREDNISONE 20 MG PO TABS: 20.0000 mg | ORAL_TABLET | Freq: Every day | ORAL | 0 refills | Status: DC

## 2020-09-17 MED ORDER — DOXYCYCLINE HYCLATE 100 MG PO TABS: 100.0000 mg | ORAL_TABLET | Freq: Two times a day (BID) | ORAL | 0 refills | Status: DC

## 2020-09-17 NOTE — Progress Notes (Signed)
Virtual telephone visit    Virtual Visit via Telephone Note   This visit type was conducted due to national recommendations for restrictions regarding the COVID-19 Pandemic (e.g. social distancing) in an effort to limit this patient's exposure and mitigate transmission in our community. Due to her co-morbid illnesses, this patient is at least at moderate risk for complications without adequate follow up. This format is felt to be most appropriate for this patient at this time. The patient did not have access to video technology or had technical difficulties with video requiring transitioning to audio format only (telephone). Physical exam was limited to content and character of the telephone converstion.    Patient location: Home Provider location: Office  I discussed the limitations of evaluation and management by telemedicine and the availability of in person appointments. The patient expressed understanding and agreed to proceed.   Visit Date: 09/17/2020  Today's healthcare provider: [name], MD   No chief complaint on file.  Subjective    HPI  Patient c/o pain in her right middle finger. She is having some swelling and redness as well. She denies any injuries. Her husband describes it as a "flare up". He has only tried neosporin on the finger with minimal relief.  The finger is almost back to normal.  There is a distal small blister but the swelling and redness and tenderness have markedly improved The finger itself is improving but patient and her husband have both had about 6 weeks of coughing that is sometimes productive.  She is not short of breath but it is not really improving. They have had a negative COVID test.     Medications: Outpatient Medications Prior to Visit  Medication Sig   Accu-Chek FastClix Lancets MISC Check blood sugar up to 4 times daily.   albuterol (VENTOLIN HFA) 108 (90 Base) MCG/ACT inhaler Inhale 2 puffs into the lungs every 4 (four)  hours as needed for wheezing or shortness of breath.   amLODipine (NORVASC) 5 MG tablet TAKE 1 TABLET BY MOUTH EVERY DAY   aspirin EC 81 MG EC tablet Take 1 tablet (81 mg total) by mouth daily.   atorvastatin (LIPITOR) 40 MG tablet TAKE 1 TABLET BY MOUTH  DAILY AT 6 PM   Blood Glucose Monitoring Suppl (ACCU-CHEK GUIDE) w/Device KIT USE AS DIRECTED   clopidogrel (PLAVIX) 75 MG tablet TAKE 1 TABLET BY MOUTH  DAILY   donepezil (ARICEPT) 5 MG tablet TAKE 1 TABLET BY MOUTH EVERYDAY AT BEDTIME   doxycycline (VIBRA-TABS) 100 MG tablet Take 1 tablet (100 mg total) by mouth 2 (two) times daily. (Patient not taking: Reported on 08/28/2020)   gabapentin (NEURONTIN) 600 MG tablet TAKE ONE-HALF TABLET BY  MOUTH TWICE DAILY   glucose blood (ACCU-CHEK GUIDE) test strip CHECK BLOOD SUGAR ONCE  DAILY   ipratropium-albuterol (DUONEB) 0.5-2.5 (3) MG/3ML SOLN Take 3 mLs by nebulization every 4 (four) hours as needed.   isosorbide mononitrate (IMDUR) 60 MG 24 hr tablet TAKE 1 TABLET BY MOUTH  DAILY   magnesium oxide (MAG-OX) 400 (241.3 MG) MG tablet Take 400 mg by mouth 2 (two) times daily.    metoprolol succinate (TOPROL-XL) 100 MG 24 hr tablet Take 1 tablet (100 mg total) by mouth daily.   mometasone (ELOCON) 0.1 % lotion Apply 4 drops topically at bedtime as needed (dry skin). (Patient not taking: No sig reported)   Multiple Vitamin (MULTIVITAMIN) tablet Take 0.5 tablets by mouth 2 (two) times daily.   Multiple Vitamins-Minerals (  ICAPS) CAPS Take 1 capsule by mouth 2 (two) times daily.    nitroGLYCERIN (NITROSTAT) 0.4 MG SL tablet PLACE 1 TABLET UNDER THE TONGUE EVERY 5 MINUTES AS NEEDED FOR CHEST PAIN   pantoprazole (PROTONIX) 40 MG tablet TAKE 1 TABLET BY MOUTH TWICE A DAY   predniSONE (DELTASONE) 20 MG tablet Take 1 tablet (20 mg total) by mouth daily with breakfast.   protective barrier (RESTORE) CREA Apple twice daily.   VITAMIN D PO Take by mouth daily.    vitamin E 180 MG (400 UNITS) capsule Take 400 Units  by mouth daily.   Wound Dressings (COMFEEL PLUS ULCER DRESSING) PADS Apply once daily as needed.   zinc sulfate 220 (50 Zn) MG capsule Take 220 mg by mouth daily.   Facility-Administered Medications Prior to Visit  Medication Dose Route Frequency Provider   ondansetron (ZOFRAN) 8 mg, dexamethasone (DECADRON) 10 mg in sodium chloride 0.9 % 50 mL IVPB   Intravenous Once Finnegan, Timothy J, MD    Review of Systems     Objective    There were no vitals taken for this visit.      Assessment & Plan     1. Cellulitis of finger, unspecified laterality Resolving  2. Chronic obstructive pulmonary disease with acute exacerbation (HCC) Issue in this patient with lung cancer.  Treat with doxycycline and prednisone  3. Cough Prednisone should help in addition to her inhalers.   No follow-ups on file.    I discussed the assessment and treatment plan with the patient. The patient was provided an opportunity to ask questions and all were answered. The patient agreed with the plan and demonstrated an understanding of the instructions.   The patient was advised to call back or seek an in-person evaluation if the symptoms worsen or if the condition fails to improve as anticipated.  I provided 11 minutes of non-face-to-face time during this encounter.  I, Richard Gilbert Jr, MD, have reviewed all documentation for this visit. The documentation on 09/17/20 for the exam, diagnosis, procedures, and orders are all accurate and complete.   Richard Gilbert Jr, MD [facility] [phone] (phone) [phone] (fax)   Medical Group   

## 2020-09-20 DIAGNOSIS — E1142 Type 2 diabetes mellitus with diabetic polyneuropathy: Secondary | ICD-10-CM | POA: Diagnosis not present

## 2020-09-20 DIAGNOSIS — J9611 Chronic respiratory failure with hypoxia: Secondary | ICD-10-CM | POA: Diagnosis not present

## 2020-09-20 DIAGNOSIS — D631 Anemia in chronic kidney disease: Secondary | ICD-10-CM | POA: Diagnosis not present

## 2020-09-20 DIAGNOSIS — M4722 Other spondylosis with radiculopathy, cervical region: Secondary | ICD-10-CM | POA: Diagnosis not present

## 2020-09-20 DIAGNOSIS — E1165 Type 2 diabetes mellitus with hyperglycemia: Secondary | ICD-10-CM | POA: Diagnosis not present

## 2020-09-20 DIAGNOSIS — E785 Hyperlipidemia, unspecified: Secondary | ICD-10-CM | POA: Diagnosis not present

## 2020-09-20 DIAGNOSIS — I6529 Occlusion and stenosis of unspecified carotid artery: Secondary | ICD-10-CM | POA: Diagnosis not present

## 2020-09-20 DIAGNOSIS — G3184 Mild cognitive impairment, so stated: Secondary | ICD-10-CM | POA: Diagnosis not present

## 2020-09-20 DIAGNOSIS — H3530 Unspecified macular degeneration: Secondary | ICD-10-CM | POA: Diagnosis not present

## 2020-09-20 DIAGNOSIS — U071 COVID-19: Secondary | ICD-10-CM | POA: Diagnosis not present

## 2020-09-20 DIAGNOSIS — Z87891 Personal history of nicotine dependence: Secondary | ICD-10-CM | POA: Diagnosis not present

## 2020-09-20 DIAGNOSIS — E43 Unspecified severe protein-calorie malnutrition: Secondary | ICD-10-CM | POA: Diagnosis not present

## 2020-09-20 DIAGNOSIS — I129 Hypertensive chronic kidney disease with stage 1 through stage 4 chronic kidney disease, or unspecified chronic kidney disease: Secondary | ICD-10-CM | POA: Diagnosis not present

## 2020-09-20 DIAGNOSIS — E538 Deficiency of other specified B group vitamins: Secondary | ICD-10-CM | POA: Diagnosis not present

## 2020-09-20 DIAGNOSIS — Z8673 Personal history of transient ischemic attack (TIA), and cerebral infarction without residual deficits: Secondary | ICD-10-CM | POA: Diagnosis not present

## 2020-09-20 DIAGNOSIS — M109 Gout, unspecified: Secondary | ICD-10-CM | POA: Diagnosis not present

## 2020-09-20 DIAGNOSIS — E039 Hypothyroidism, unspecified: Secondary | ICD-10-CM | POA: Diagnosis not present

## 2020-09-20 DIAGNOSIS — Z9049 Acquired absence of other specified parts of digestive tract: Secondary | ICD-10-CM | POA: Diagnosis not present

## 2020-09-20 DIAGNOSIS — L89151 Pressure ulcer of sacral region, stage 1: Secondary | ICD-10-CM | POA: Diagnosis not present

## 2020-09-20 DIAGNOSIS — N183 Chronic kidney disease, stage 3 unspecified: Secondary | ICD-10-CM | POA: Diagnosis not present

## 2020-09-20 DIAGNOSIS — I2510 Atherosclerotic heart disease of native coronary artery without angina pectoris: Secondary | ICD-10-CM | POA: Diagnosis not present

## 2020-09-20 DIAGNOSIS — M4697 Unspecified inflammatory spondylopathy, lumbosacral region: Secondary | ICD-10-CM | POA: Diagnosis not present

## 2020-09-20 DIAGNOSIS — E1122 Type 2 diabetes mellitus with diabetic chronic kidney disease: Secondary | ICD-10-CM | POA: Diagnosis not present

## 2020-09-20 DIAGNOSIS — J432 Centrilobular emphysema: Secondary | ICD-10-CM | POA: Diagnosis not present

## 2020-09-20 DIAGNOSIS — Z981 Arthrodesis status: Secondary | ICD-10-CM | POA: Diagnosis not present

## 2020-09-23 DIAGNOSIS — Z8673 Personal history of transient ischemic attack (TIA), and cerebral infarction without residual deficits: Secondary | ICD-10-CM | POA: Diagnosis not present

## 2020-09-23 DIAGNOSIS — H3530 Unspecified macular degeneration: Secondary | ICD-10-CM | POA: Diagnosis not present

## 2020-09-23 DIAGNOSIS — Z981 Arthrodesis status: Secondary | ICD-10-CM | POA: Diagnosis not present

## 2020-09-23 DIAGNOSIS — M4697 Unspecified inflammatory spondylopathy, lumbosacral region: Secondary | ICD-10-CM | POA: Diagnosis not present

## 2020-09-23 DIAGNOSIS — E785 Hyperlipidemia, unspecified: Secondary | ICD-10-CM | POA: Diagnosis not present

## 2020-09-23 DIAGNOSIS — J9611 Chronic respiratory failure with hypoxia: Secondary | ICD-10-CM | POA: Diagnosis not present

## 2020-09-23 DIAGNOSIS — I2510 Atherosclerotic heart disease of native coronary artery without angina pectoris: Secondary | ICD-10-CM | POA: Diagnosis not present

## 2020-09-23 DIAGNOSIS — M4722 Other spondylosis with radiculopathy, cervical region: Secondary | ICD-10-CM | POA: Diagnosis not present

## 2020-09-23 DIAGNOSIS — U071 COVID-19: Secondary | ICD-10-CM | POA: Diagnosis not present

## 2020-09-23 DIAGNOSIS — D631 Anemia in chronic kidney disease: Secondary | ICD-10-CM | POA: Diagnosis not present

## 2020-09-23 DIAGNOSIS — E43 Unspecified severe protein-calorie malnutrition: Secondary | ICD-10-CM | POA: Diagnosis not present

## 2020-09-23 DIAGNOSIS — N183 Chronic kidney disease, stage 3 unspecified: Secondary | ICD-10-CM | POA: Diagnosis not present

## 2020-09-23 DIAGNOSIS — Z87891 Personal history of nicotine dependence: Secondary | ICD-10-CM | POA: Diagnosis not present

## 2020-09-23 DIAGNOSIS — E1122 Type 2 diabetes mellitus with diabetic chronic kidney disease: Secondary | ICD-10-CM | POA: Diagnosis not present

## 2020-09-23 DIAGNOSIS — J432 Centrilobular emphysema: Secondary | ICD-10-CM | POA: Diagnosis not present

## 2020-09-23 DIAGNOSIS — E039 Hypothyroidism, unspecified: Secondary | ICD-10-CM | POA: Diagnosis not present

## 2020-09-23 DIAGNOSIS — Z9049 Acquired absence of other specified parts of digestive tract: Secondary | ICD-10-CM | POA: Diagnosis not present

## 2020-09-23 DIAGNOSIS — J449 Chronic obstructive pulmonary disease, unspecified: Secondary | ICD-10-CM | POA: Diagnosis not present

## 2020-09-23 DIAGNOSIS — E1165 Type 2 diabetes mellitus with hyperglycemia: Secondary | ICD-10-CM | POA: Diagnosis not present

## 2020-09-23 DIAGNOSIS — R0602 Shortness of breath: Secondary | ICD-10-CM | POA: Diagnosis not present

## 2020-09-23 DIAGNOSIS — M109 Gout, unspecified: Secondary | ICD-10-CM | POA: Diagnosis not present

## 2020-09-23 DIAGNOSIS — L89151 Pressure ulcer of sacral region, stage 1: Secondary | ICD-10-CM | POA: Diagnosis not present

## 2020-09-23 DIAGNOSIS — E538 Deficiency of other specified B group vitamins: Secondary | ICD-10-CM | POA: Diagnosis not present

## 2020-09-23 DIAGNOSIS — I129 Hypertensive chronic kidney disease with stage 1 through stage 4 chronic kidney disease, or unspecified chronic kidney disease: Secondary | ICD-10-CM | POA: Diagnosis not present

## 2020-09-23 DIAGNOSIS — I6529 Occlusion and stenosis of unspecified carotid artery: Secondary | ICD-10-CM | POA: Diagnosis not present

## 2020-09-23 DIAGNOSIS — E1142 Type 2 diabetes mellitus with diabetic polyneuropathy: Secondary | ICD-10-CM | POA: Diagnosis not present

## 2020-09-23 DIAGNOSIS — G3184 Mild cognitive impairment, so stated: Secondary | ICD-10-CM | POA: Diagnosis not present

## 2020-09-23 NOTE — Telephone Encounter (Signed)
Noted  

## 2020-09-25 DIAGNOSIS — J449 Chronic obstructive pulmonary disease, unspecified: Secondary | ICD-10-CM | POA: Diagnosis not present

## 2020-09-27 DIAGNOSIS — M4722 Other spondylosis with radiculopathy, cervical region: Secondary | ICD-10-CM | POA: Diagnosis not present

## 2020-09-27 DIAGNOSIS — L89151 Pressure ulcer of sacral region, stage 1: Secondary | ICD-10-CM | POA: Diagnosis not present

## 2020-09-27 DIAGNOSIS — Z8673 Personal history of transient ischemic attack (TIA), and cerebral infarction without residual deficits: Secondary | ICD-10-CM | POA: Diagnosis not present

## 2020-09-27 DIAGNOSIS — M109 Gout, unspecified: Secondary | ICD-10-CM | POA: Diagnosis not present

## 2020-09-27 DIAGNOSIS — E785 Hyperlipidemia, unspecified: Secondary | ICD-10-CM | POA: Diagnosis not present

## 2020-09-27 DIAGNOSIS — J9611 Chronic respiratory failure with hypoxia: Secondary | ICD-10-CM | POA: Diagnosis not present

## 2020-09-27 DIAGNOSIS — E1165 Type 2 diabetes mellitus with hyperglycemia: Secondary | ICD-10-CM | POA: Diagnosis not present

## 2020-09-27 DIAGNOSIS — Z981 Arthrodesis status: Secondary | ICD-10-CM | POA: Diagnosis not present

## 2020-09-27 DIAGNOSIS — G3184 Mild cognitive impairment, so stated: Secondary | ICD-10-CM | POA: Diagnosis not present

## 2020-09-27 DIAGNOSIS — I2510 Atherosclerotic heart disease of native coronary artery without angina pectoris: Secondary | ICD-10-CM | POA: Diagnosis not present

## 2020-09-27 DIAGNOSIS — E1122 Type 2 diabetes mellitus with diabetic chronic kidney disease: Secondary | ICD-10-CM | POA: Diagnosis not present

## 2020-09-27 DIAGNOSIS — I129 Hypertensive chronic kidney disease with stage 1 through stage 4 chronic kidney disease, or unspecified chronic kidney disease: Secondary | ICD-10-CM | POA: Diagnosis not present

## 2020-09-27 DIAGNOSIS — U071 COVID-19: Secondary | ICD-10-CM | POA: Diagnosis not present

## 2020-09-27 DIAGNOSIS — E43 Unspecified severe protein-calorie malnutrition: Secondary | ICD-10-CM | POA: Diagnosis not present

## 2020-09-27 DIAGNOSIS — M4697 Unspecified inflammatory spondylopathy, lumbosacral region: Secondary | ICD-10-CM | POA: Diagnosis not present

## 2020-09-27 DIAGNOSIS — J432 Centrilobular emphysema: Secondary | ICD-10-CM | POA: Diagnosis not present

## 2020-09-27 DIAGNOSIS — Z87891 Personal history of nicotine dependence: Secondary | ICD-10-CM | POA: Diagnosis not present

## 2020-09-27 DIAGNOSIS — D631 Anemia in chronic kidney disease: Secondary | ICD-10-CM | POA: Diagnosis not present

## 2020-09-27 DIAGNOSIS — Z9049 Acquired absence of other specified parts of digestive tract: Secondary | ICD-10-CM | POA: Diagnosis not present

## 2020-09-27 DIAGNOSIS — E538 Deficiency of other specified B group vitamins: Secondary | ICD-10-CM | POA: Diagnosis not present

## 2020-09-27 DIAGNOSIS — I6529 Occlusion and stenosis of unspecified carotid artery: Secondary | ICD-10-CM | POA: Diagnosis not present

## 2020-09-27 DIAGNOSIS — N183 Chronic kidney disease, stage 3 unspecified: Secondary | ICD-10-CM | POA: Diagnosis not present

## 2020-09-27 DIAGNOSIS — H3530 Unspecified macular degeneration: Secondary | ICD-10-CM | POA: Diagnosis not present

## 2020-09-27 DIAGNOSIS — E1142 Type 2 diabetes mellitus with diabetic polyneuropathy: Secondary | ICD-10-CM | POA: Diagnosis not present

## 2020-09-27 DIAGNOSIS — E039 Hypothyroidism, unspecified: Secondary | ICD-10-CM | POA: Diagnosis not present

## 2020-10-01 ENCOUNTER — Telehealth: Payer: Self-pay

## 2020-10-01 DIAGNOSIS — C3411 Malignant neoplasm of upper lobe, right bronchus or lung: Secondary | ICD-10-CM

## 2020-10-01 DIAGNOSIS — J439 Emphysema, unspecified: Secondary | ICD-10-CM

## 2020-10-01 DIAGNOSIS — E1142 Type 2 diabetes mellitus with diabetic polyneuropathy: Secondary | ICD-10-CM

## 2020-10-01 NOTE — Telephone Encounter (Signed)
Copied from [place] [id number]. Topic: General - Other >> Oct 01, 2020 10:27 AM [name] wrote: Manus Gunning with Hospice states pt is needing to be placed in palliative plan and needs to make sure Dr Darnell Level agrees to the process, please let Manus Gunning know when returns from vacation that this works for you. Secured line is [phone]. No rush if can hear back by the 15th.

## 2020-10-02 DIAGNOSIS — E039 Hypothyroidism, unspecified: Secondary | ICD-10-CM | POA: Diagnosis not present

## 2020-10-02 DIAGNOSIS — E43 Unspecified severe protein-calorie malnutrition: Secondary | ICD-10-CM | POA: Diagnosis not present

## 2020-10-02 DIAGNOSIS — U071 COVID-19: Secondary | ICD-10-CM | POA: Diagnosis not present

## 2020-10-02 DIAGNOSIS — Z87891 Personal history of nicotine dependence: Secondary | ICD-10-CM | POA: Diagnosis not present

## 2020-10-02 DIAGNOSIS — H3530 Unspecified macular degeneration: Secondary | ICD-10-CM | POA: Diagnosis not present

## 2020-10-02 DIAGNOSIS — I6529 Occlusion and stenosis of unspecified carotid artery: Secondary | ICD-10-CM | POA: Diagnosis not present

## 2020-10-02 DIAGNOSIS — M109 Gout, unspecified: Secondary | ICD-10-CM | POA: Diagnosis not present

## 2020-10-02 DIAGNOSIS — M4697 Unspecified inflammatory spondylopathy, lumbosacral region: Secondary | ICD-10-CM | POA: Diagnosis not present

## 2020-10-02 DIAGNOSIS — I2510 Atherosclerotic heart disease of native coronary artery without angina pectoris: Secondary | ICD-10-CM | POA: Diagnosis not present

## 2020-10-02 DIAGNOSIS — E1142 Type 2 diabetes mellitus with diabetic polyneuropathy: Secondary | ICD-10-CM | POA: Diagnosis not present

## 2020-10-02 DIAGNOSIS — N183 Chronic kidney disease, stage 3 unspecified: Secondary | ICD-10-CM | POA: Diagnosis not present

## 2020-10-02 DIAGNOSIS — L89151 Pressure ulcer of sacral region, stage 1: Secondary | ICD-10-CM | POA: Diagnosis not present

## 2020-10-02 DIAGNOSIS — I129 Hypertensive chronic kidney disease with stage 1 through stage 4 chronic kidney disease, or unspecified chronic kidney disease: Secondary | ICD-10-CM | POA: Diagnosis not present

## 2020-10-02 DIAGNOSIS — Z8673 Personal history of transient ischemic attack (TIA), and cerebral infarction without residual deficits: Secondary | ICD-10-CM | POA: Diagnosis not present

## 2020-10-02 DIAGNOSIS — J9611 Chronic respiratory failure with hypoxia: Secondary | ICD-10-CM | POA: Diagnosis not present

## 2020-10-02 DIAGNOSIS — E538 Deficiency of other specified B group vitamins: Secondary | ICD-10-CM | POA: Diagnosis not present

## 2020-10-02 DIAGNOSIS — J432 Centrilobular emphysema: Secondary | ICD-10-CM | POA: Diagnosis not present

## 2020-10-02 DIAGNOSIS — G3184 Mild cognitive impairment, so stated: Secondary | ICD-10-CM | POA: Diagnosis not present

## 2020-10-02 DIAGNOSIS — E1122 Type 2 diabetes mellitus with diabetic chronic kidney disease: Secondary | ICD-10-CM | POA: Diagnosis not present

## 2020-10-02 DIAGNOSIS — E1165 Type 2 diabetes mellitus with hyperglycemia: Secondary | ICD-10-CM | POA: Diagnosis not present

## 2020-10-02 DIAGNOSIS — Z981 Arthrodesis status: Secondary | ICD-10-CM | POA: Diagnosis not present

## 2020-10-02 DIAGNOSIS — Z9049 Acquired absence of other specified parts of digestive tract: Secondary | ICD-10-CM | POA: Diagnosis not present

## 2020-10-02 DIAGNOSIS — D631 Anemia in chronic kidney disease: Secondary | ICD-10-CM | POA: Diagnosis not present

## 2020-10-02 DIAGNOSIS — M4722 Other spondylosis with radiculopathy, cervical region: Secondary | ICD-10-CM | POA: Diagnosis not present

## 2020-10-02 DIAGNOSIS — E785 Hyperlipidemia, unspecified: Secondary | ICD-10-CM | POA: Diagnosis not present

## 2020-10-03 ENCOUNTER — Other Ambulatory Visit: Payer: Self-pay | Admitting: Family Medicine

## 2020-10-03 DIAGNOSIS — E039 Hypothyroidism, unspecified: Secondary | ICD-10-CM | POA: Diagnosis not present

## 2020-10-03 DIAGNOSIS — E1122 Type 2 diabetes mellitus with diabetic chronic kidney disease: Secondary | ICD-10-CM | POA: Diagnosis not present

## 2020-10-03 DIAGNOSIS — Z981 Arthrodesis status: Secondary | ICD-10-CM | POA: Diagnosis not present

## 2020-10-03 DIAGNOSIS — Z9049 Acquired absence of other specified parts of digestive tract: Secondary | ICD-10-CM | POA: Diagnosis not present

## 2020-10-03 DIAGNOSIS — M4697 Unspecified inflammatory spondylopathy, lumbosacral region: Secondary | ICD-10-CM | POA: Diagnosis not present

## 2020-10-03 DIAGNOSIS — I6529 Occlusion and stenosis of unspecified carotid artery: Secondary | ICD-10-CM | POA: Diagnosis not present

## 2020-10-03 DIAGNOSIS — I2510 Atherosclerotic heart disease of native coronary artery without angina pectoris: Secondary | ICD-10-CM | POA: Diagnosis not present

## 2020-10-03 DIAGNOSIS — N183 Chronic kidney disease, stage 3 unspecified: Secondary | ICD-10-CM | POA: Diagnosis not present

## 2020-10-03 DIAGNOSIS — L89151 Pressure ulcer of sacral region, stage 1: Secondary | ICD-10-CM | POA: Diagnosis not present

## 2020-10-03 DIAGNOSIS — G3184 Mild cognitive impairment, so stated: Secondary | ICD-10-CM | POA: Diagnosis not present

## 2020-10-03 DIAGNOSIS — E43 Unspecified severe protein-calorie malnutrition: Secondary | ICD-10-CM | POA: Diagnosis not present

## 2020-10-03 DIAGNOSIS — Z8673 Personal history of transient ischemic attack (TIA), and cerebral infarction without residual deficits: Secondary | ICD-10-CM | POA: Diagnosis not present

## 2020-10-03 DIAGNOSIS — D631 Anemia in chronic kidney disease: Secondary | ICD-10-CM | POA: Diagnosis not present

## 2020-10-03 DIAGNOSIS — Z87891 Personal history of nicotine dependence: Secondary | ICD-10-CM | POA: Diagnosis not present

## 2020-10-03 DIAGNOSIS — I10 Essential (primary) hypertension: Secondary | ICD-10-CM

## 2020-10-03 DIAGNOSIS — E538 Deficiency of other specified B group vitamins: Secondary | ICD-10-CM | POA: Diagnosis not present

## 2020-10-03 DIAGNOSIS — M109 Gout, unspecified: Secondary | ICD-10-CM | POA: Diagnosis not present

## 2020-10-03 DIAGNOSIS — U071 COVID-19: Secondary | ICD-10-CM | POA: Diagnosis not present

## 2020-10-03 DIAGNOSIS — J9611 Chronic respiratory failure with hypoxia: Secondary | ICD-10-CM | POA: Diagnosis not present

## 2020-10-03 DIAGNOSIS — M4722 Other spondylosis with radiculopathy, cervical region: Secondary | ICD-10-CM | POA: Diagnosis not present

## 2020-10-03 DIAGNOSIS — E785 Hyperlipidemia, unspecified: Secondary | ICD-10-CM | POA: Diagnosis not present

## 2020-10-03 DIAGNOSIS — H3530 Unspecified macular degeneration: Secondary | ICD-10-CM | POA: Diagnosis not present

## 2020-10-03 DIAGNOSIS — I129 Hypertensive chronic kidney disease with stage 1 through stage 4 chronic kidney disease, or unspecified chronic kidney disease: Secondary | ICD-10-CM | POA: Diagnosis not present

## 2020-10-03 DIAGNOSIS — E1165 Type 2 diabetes mellitus with hyperglycemia: Secondary | ICD-10-CM | POA: Diagnosis not present

## 2020-10-03 DIAGNOSIS — E1142 Type 2 diabetes mellitus with diabetic polyneuropathy: Secondary | ICD-10-CM | POA: Diagnosis not present

## 2020-10-03 DIAGNOSIS — J432 Centrilobular emphysema: Secondary | ICD-10-CM | POA: Diagnosis not present

## 2020-10-06 DIAGNOSIS — J432 Centrilobular emphysema: Secondary | ICD-10-CM | POA: Diagnosis not present

## 2020-10-06 DIAGNOSIS — J9611 Chronic respiratory failure with hypoxia: Secondary | ICD-10-CM | POA: Diagnosis not present

## 2020-10-06 DIAGNOSIS — Z9181 History of falling: Secondary | ICD-10-CM | POA: Diagnosis not present

## 2020-10-08 NOTE — Telephone Encounter (Signed)
Contacted Manus Gunning, and she states that patient has decided to go through [facility] care .kw

## 2020-10-09 NOTE — Telephone Encounter (Signed)
Referral for palliative care placed.

## 2020-10-10 ENCOUNTER — Telehealth: Payer: Self-pay

## 2020-10-10 NOTE — Telephone Encounter (Signed)
Received updated referral for patient. Call placed to patient's husband. Visit scheduled with Palliative NP for 10/15/20

## 2020-10-15 ENCOUNTER — Other Ambulatory Visit: Payer: Medicare Other | Admitting: Student

## 2020-10-15 ENCOUNTER — Other Ambulatory Visit: Payer: Self-pay

## 2020-10-15 DIAGNOSIS — E1122 Type 2 diabetes mellitus with diabetic chronic kidney disease: Secondary | ICD-10-CM | POA: Diagnosis not present

## 2020-10-15 DIAGNOSIS — R0600 Dyspnea, unspecified: Secondary | ICD-10-CM | POA: Diagnosis not present

## 2020-10-15 DIAGNOSIS — R531 Weakness: Secondary | ICD-10-CM

## 2020-10-15 DIAGNOSIS — J441 Chronic obstructive pulmonary disease with (acute) exacerbation: Secondary | ICD-10-CM | POA: Diagnosis not present

## 2020-10-15 DIAGNOSIS — R0609 Other forms of dyspnea: Secondary | ICD-10-CM

## 2020-10-15 DIAGNOSIS — Z515 Encounter for palliative care: Secondary | ICD-10-CM | POA: Diagnosis not present

## 2020-10-15 DIAGNOSIS — R32 Unspecified urinary incontinence: Secondary | ICD-10-CM

## 2020-10-15 DIAGNOSIS — N1831 Chronic kidney disease, stage 3a: Secondary | ICD-10-CM

## 2020-10-15 NOTE — Progress Notes (Signed)
[facility] Consult Note Telephone: [phone]  Fax: [phone]    Date of encounter: 10/15/20 PATIENT NAME: [name] [name] [address] [place] [address]   [phone] (home)  DOB: 12/03/1940 MRN: [id number] PRIMARY CARE PROVIDER:    Jerrol Banana., MD,  [address] [place] [address] [phone]  REFERRING PROVIDER:   Jerrol Banana., MD [address] [place],  [place] [address] [phone]  RESPONSIBLE PARTY:    Contact Information     Name Relation Home Work Mobile   Kaucher,[name] Spouse [phone]  [phone]   Roosvelt Harps Daughter [phone]  [phone]   Rolland Bimler [phone]     charles,carrie Relative   [phone]        I met face to face with patient and family in  the home. Palliative Care was asked to follow this patient by consultation request of  Jerrol Banana.,* to address advance care planning and complex medical decision making. This is a follow up visit.                                   ASSESSMENT AND PLAN / RECOMMENDATIONS:   Advance Care Planning/Goals of Care: Goals include to maximize quality of life and symptom management. Our advance care planning conversation included a discussion about:    The value and importance of advance care planning  Experiences with loved ones who have been seriously ill or have died  Exploration of personal, cultural or spiritual beliefs that might influence medical decisions  Exploration of goals of care in the event of a sudden injury or illness  CODE STATUS: DNR  Symptom Management/Plan:  Dyspnea-secondary to COPD, Covid-19 infection in past 3 months. Continue oxygen at 3 lpm via nasal canula. Continue inhaler and nebulizer as directed.  Generalized weakness-secondary to COPD. Patient recently completed PT. She is encouraged to continue exercises to maintain her current  level. Use walker, w/c for safety.   Incontinence- of bowel and bladder. Encouraged husband to start routine toileting. Encouraged to wear briefs at night and pull ups during the day.   T2DM-encourage HS snack to prevent hypoglycemia episodes in the morning. Continue glimepiride each morning as directed. Check blood sugar daily.   Support-patient in need of additional support/care in the home. Husband would like for patient to remain in the home. Will place referral to Palliative SW to explore any supports she may be eligible for in the home.  Follow up Palliative Care Visit: Palliative care will continue to follow for complex medical decision making, advance care planning, and clarification of goals. Return in 8 weeks or prn.  I spent 60 minutes providing this consultation. More than 50% of the time in this consultation was spent in counseling and care coordination.   PPS: 40%  HOSPICE ELIGIBILITY/DIAGNOSIS: TBD  Chief Complaint: Palliative Medicine follow up visit.   HISTORY OF PRESENT ILLNESS:  [name] [name] is a 80 y.o. year old female  with COPD, CKD 3, right upper lobe cancer, T2DM with polyneuropathy. Patient last saw Dr. [name]- 2021; she received chemotherapy and radiation. Currently under surveillance. States she would not do any further treatments.   Patient resides at home with husband and step daughter. She had Covid about 3 months ago. She and husband report she is doing okay, but is needing more assistance in the home. Husband  assist with all daily care needs, incontinence. Patient wears oxygen continuously at 3 liters per minute. She eats a very large breakfast, otherwise snacks throughout the day. Blood sugars checked daily; usually in the 80's to 120 mg/dL. Occasional blood sugar around 60 in the am; husband gives orange juice. Takes glimepiride Q AM. Husband reports a 10 pound weight loss over one year. She is out of bed daily; ambulates about 10-15 steps a day. Therapy  finished one month ago. Sleeps good at night. Naps intermittently during the day. No recent falls or injury. Increased incontinence of bowel and bladder. No skin breakdown reported at present. Denies pain, chest pain, nausea, constipation. Takes nitroglycerin PRN; last took 3 months ago.     History obtained from review of EMR, discussion with primary team, and interview with family, facility staff/caregiver and/or [name] [name].  I reviewed available labs, medications, imaging, studies and related documents from the EMR.  Records reviewed and summarized above.   ROS  General: NAD EYES: denies vision changes ENMT: denies dysphagia Cardiovascular: denies chest pain  Pulmonary: denies cough, SOB with exertion Abdomen: endorses fair appetite, denies constipation GU: denies dysuria MSK: weakness, no falls reported Skin: denies rashes or wounds Neurological: denies pain, denies insomnia Psych: Endorses stable mood Heme/lymph/immuno: denies bruises, abnormal bleeding  Physical Exam: Pulse 66, resp 20, b/p 120/64, sats 99% on 3 lpm Constitutional: NAD General: frail appearing, thin EYES: anicteric sclera, lids intact, no discharge  ENMT: intact hearing, oral mucous membranes moist, dentition intact CV: S1S2, RRR, no LE edema Pulmonary: Lungs diminished,  no increased work of breathing, no cough Abdomen:  normo-active BS + 4 quadrants, soft and non tender GU: deferred MSK: moves all extremities, ambulatory short distances Skin: warm and dry, no rashes or wounds on visible skin Neuro:  generalized weakness, A & O x 3, forgetful Psych: non-anxious affect Hem/lymph/immuno: no widespread bruising   Thank you for the opportunity to participate in the care of [name] [name].  The palliative care team will continue to follow. Please call our office at [phone] if we can be of additional assistance.   Ezekiel Slocumb, NP   COVID-19 PATIENT SCREENING TOOL Asked and negative response unless  otherwise noted:   Have you had symptoms of covid, tested positive or been in contact with someone with symptoms/positive test in the past 5-10 days? No

## 2020-10-16 ENCOUNTER — Telehealth: Payer: Self-pay

## 2020-10-16 NOTE — Telephone Encounter (Signed)
10/16/20 @1115  AM: Palliative care SW outreached patients spouse, per PC NP - L. Rivers, request to discuss patient needs.   Spouse shared that he is the primary caregiver for patient  and assist patient with bathing, dressing, transfers and toileting. Patient is WC bound.  Spouse shared that he additional in home support would be beneficial.  Patient will not qualify for Medicaid. Spouse is VA connected but has not outreached the [place] in some years to inquire of benefits again. SW encouraged spouse to outreach [place] SW again. SW shared information  on local PACE program and will mail more information to spouse to look into and make a decision on services. Outside of possible VA benefits and PACE program the in home support resources available to patient are limited.   SW  will inquire with volunteer services on possible incontinence supplies (Medium briefs) to provide to patient is available.

## 2020-10-23 DIAGNOSIS — J449 Chronic obstructive pulmonary disease, unspecified: Secondary | ICD-10-CM | POA: Diagnosis not present

## 2020-10-23 DIAGNOSIS — R0602 Shortness of breath: Secondary | ICD-10-CM | POA: Diagnosis not present

## 2020-10-25 DIAGNOSIS — J449 Chronic obstructive pulmonary disease, unspecified: Secondary | ICD-10-CM | POA: Diagnosis not present

## 2020-11-03 ENCOUNTER — Other Ambulatory Visit: Payer: Self-pay | Admitting: Family Medicine

## 2020-11-03 DIAGNOSIS — E1142 Type 2 diabetes mellitus with diabetic polyneuropathy: Secondary | ICD-10-CM

## 2020-11-03 NOTE — Telephone Encounter (Signed)
Requested Prescriptions  Pending Prescriptions Disp Refills  . glucose blood (ACCU-CHEK GUIDE) test strip [Pharmacy Med Name: ACCU-CHEK GUIDE TEST STRIP] 100 strip 2    Sig: USE ONCE DAILY AS DIRECTED     Endocrinology: Diabetes - Testing Supplies Passed - 11/03/2020 12:43 AM      Passed - Valid encounter within last 12 months    Recent Outpatient Visits          1 month ago Cellulitis of finger, unspecified laterality   [facility] Jerrol Banana., MD   2 months ago [facility] Ros[facility] Randy, Retia Passe., MD   3 months ago Skin ulcer of sacrum, limited to breakdown of skin [facility]: [place] Rhonda W)   [facility] Carles Collet [place], [place]   6 months ago Encounter for immunization   [facility] Jerrol Banana., MD   7 months ago Diabetic polyneuropathy associated with type 2 diabetes mellitus [facility])   [facility] Jerrol Banana., MD      Future Appointments            In 1 week Jerrol Banana., MD [facility]

## 2020-11-05 ENCOUNTER — Ambulatory Visit: Payer: Self-pay | Admitting: Family Medicine

## 2020-11-06 DIAGNOSIS — J9611 Chronic respiratory failure with hypoxia: Secondary | ICD-10-CM | POA: Diagnosis not present

## 2020-11-06 DIAGNOSIS — Z9181 History of falling: Secondary | ICD-10-CM | POA: Diagnosis not present

## 2020-11-06 DIAGNOSIS — J432 Centrilobular emphysema: Secondary | ICD-10-CM | POA: Diagnosis not present

## 2020-11-13 ENCOUNTER — Ambulatory Visit: Payer: Self-pay | Admitting: Family Medicine

## 2020-11-15 ENCOUNTER — Other Ambulatory Visit: Payer: Self-pay | Admitting: Family Medicine

## 2020-11-15 DIAGNOSIS — I10 Essential (primary) hypertension: Secondary | ICD-10-CM

## 2020-11-18 ENCOUNTER — Other Ambulatory Visit: Payer: Self-pay

## 2020-11-18 ENCOUNTER — Emergency Department: Payer: Medicare Other

## 2020-11-18 ENCOUNTER — Emergency Department
Admission: EM | Admit: 2020-11-18 | Discharge: 2020-11-19 | Disposition: A | Discharge status: Home / Self Care | Payer: Medicare Other | Attending: Emergency Medicine | Admitting: Emergency Medicine

## 2020-11-18 ENCOUNTER — Ambulatory Visit: Payer: Self-pay

## 2020-11-18 DIAGNOSIS — R6889 Other general symptoms and signs: Secondary | ICD-10-CM | POA: Diagnosis not present

## 2020-11-18 DIAGNOSIS — E1122 Type 2 diabetes mellitus with diabetic chronic kidney disease: Secondary | ICD-10-CM | POA: Insufficient documentation

## 2020-11-18 DIAGNOSIS — Z8616 Personal history of COVID-19: Secondary | ICD-10-CM | POA: Insufficient documentation

## 2020-11-18 DIAGNOSIS — I129 Hypertensive chronic kidney disease with stage 1 through stage 4 chronic kidney disease, or unspecified chronic kidney disease: Secondary | ICD-10-CM | POA: Insufficient documentation

## 2020-11-18 DIAGNOSIS — Z85118 Personal history of other malignant neoplasm of bronchus and lung: Secondary | ICD-10-CM | POA: Diagnosis not present

## 2020-11-18 DIAGNOSIS — J441 Chronic obstructive pulmonary disease with (acute) exacerbation: Secondary | ICD-10-CM | POA: Diagnosis not present

## 2020-11-18 DIAGNOSIS — E039 Hypothyroidism, unspecified: Secondary | ICD-10-CM | POA: Diagnosis not present

## 2020-11-18 DIAGNOSIS — Z79899 Other long term (current) drug therapy: Secondary | ICD-10-CM | POA: Diagnosis not present

## 2020-11-18 DIAGNOSIS — Z87891 Personal history of nicotine dependence: Secondary | ICD-10-CM | POA: Insufficient documentation

## 2020-11-18 DIAGNOSIS — B9689 Other specified bacterial agents as the cause of diseases classified elsewhere: Secondary | ICD-10-CM | POA: Insufficient documentation

## 2020-11-18 DIAGNOSIS — M6281 Muscle weakness (generalized): Secondary | ICD-10-CM | POA: Diagnosis present

## 2020-11-18 DIAGNOSIS — Z7982 Long term (current) use of aspirin: Secondary | ICD-10-CM | POA: Insufficient documentation

## 2020-11-18 DIAGNOSIS — R531 Weakness: Secondary | ICD-10-CM

## 2020-11-18 DIAGNOSIS — N3000 Acute cystitis without hematuria: Secondary | ICD-10-CM

## 2020-11-18 DIAGNOSIS — Z20822 Contact with and (suspected) exposure to covid-19: Secondary | ICD-10-CM | POA: Insufficient documentation

## 2020-11-18 DIAGNOSIS — Z7902 Long term (current) use of antithrombotics/antiplatelets: Secondary | ICD-10-CM | POA: Diagnosis not present

## 2020-11-18 DIAGNOSIS — I959 Hypotension, unspecified: Secondary | ICD-10-CM | POA: Diagnosis not present

## 2020-11-18 DIAGNOSIS — N183 Chronic kidney disease, stage 3 unspecified: Secondary | ICD-10-CM | POA: Insufficient documentation

## 2020-11-18 DIAGNOSIS — I2510 Atherosclerotic heart disease of native coronary artery without angina pectoris: Secondary | ICD-10-CM | POA: Diagnosis not present

## 2020-11-18 DIAGNOSIS — Z743 Need for continuous supervision: Secondary | ICD-10-CM | POA: Diagnosis not present

## 2020-11-18 LAB — HEPATIC FUNCTION PANEL
ALT: 18 U/L (ref 0–44)
AST: 18 U/L (ref 15–41)
Albumin: 3.5 g/dL (ref 3.5–5.0)
Alkaline Phosphatase: 71 U/L (ref 38–126)
Bilirubin, Direct: 0.1 mg/dL (ref 0.0–0.2)
Indirect Bilirubin: 0.7 mg/dL (ref 0.3–0.9)
Total Bilirubin: 0.8 mg/dL (ref 0.3–1.2)
Total Protein: 7.3 g/dL (ref 6.5–8.1)

## 2020-11-18 LAB — URINALYSIS, COMPLETE (UACMP) WITH MICROSCOPIC
Bilirubin Urine: NEGATIVE
Glucose, UA: NEGATIVE mg/dL
Hgb urine dipstick: NEGATIVE
Ketones, ur: 5 mg/dL — AB
Nitrite: POSITIVE — AB
Protein, ur: 30 mg/dL — AB
Specific Gravity, Urine: 1.014 (ref 1.005–1.030)
pH: 7 (ref 5.0–8.0)

## 2020-11-18 LAB — TROPONIN I (HIGH SENSITIVITY): Troponin I (High Sensitivity): 7 ng/L (ref <18)

## 2020-11-18 LAB — CBC
HCT: 36.2 % (ref 36.0–46.0)
Hemoglobin: 11.2 g/dL — ABNORMAL LOW (ref 12.0–15.0)
MCH: 30.7 pg (ref 26.0–34.0)
MCHC: 30.9 g/dL (ref 30.0–36.0)
MCV: 99.2 fL (ref 80.0–100.0)
Platelets: 251 10*3/uL (ref 150–400)
RBC: 3.65 MIL/uL — ABNORMAL LOW (ref 3.87–5.11)
RDW: 15.3 % (ref 11.5–15.5)
WBC: 9.2 10*3/uL (ref 4.0–10.5)
nRBC: 0 % (ref 0.0–0.2)

## 2020-11-18 LAB — BASIC METABOLIC PANEL
Anion gap: 7 (ref 5–15)
BUN: 18 mg/dL (ref 8–23)
CO2: 40 mmol/L — ABNORMAL HIGH (ref 22–32)
Calcium: 9.1 mg/dL (ref 8.9–10.3)
Chloride: 94 mmol/L — ABNORMAL LOW (ref 98–111)
Creatinine, Ser: 1.21 mg/dL — ABNORMAL HIGH (ref 0.44–1.00)
GFR, Estimated: 46 mL/min — ABNORMAL LOW (ref >60)
Glucose, Bld: 113 mg/dL — ABNORMAL HIGH (ref 70–99)
Potassium: 5.1 mmol/L (ref 3.5–5.1)
Sodium: 141 mmol/L (ref 135–145)

## 2020-11-18 LAB — RESP PANEL BY RT-PCR (FLU A&B, COVID) ARPGX2
Influenza A by PCR: NEGATIVE
Influenza B by PCR: NEGATIVE
SARS Coronavirus 2 by RT PCR: NEGATIVE

## 2020-11-18 MED ORDER — CEPHALEXIN 500 MG PO CAPS: 500.0000 mg | ORAL_CAPSULE | Freq: Two times a day (BID) | ORAL | 0 refills | Status: DC

## 2020-11-18 MED ORDER — SODIUM CHLORIDE 0.9 % IV SOLN
1.0000 g | Freq: Once | INTRAVENOUS | Status: AC
  Administered 2020-11-18: 1 g via INTRAVENOUS
  Filled 2020-11-18: qty 10

## 2020-11-18 MED ORDER — LACTATED RINGERS IV BOLUS
1000.0000 mL | Freq: Once | INTRAVENOUS | Status: AC
  Administered 2020-11-18: 1000 mL via INTRAVENOUS

## 2020-11-18 NOTE — ED Triage Notes (Signed)
Arrives from home via [facility], c/o weakness and lethargy x 1 week.  VS wnl.  Wears 3l/ [place] home oxygen.  Lives at home

## 2020-11-18 NOTE — ED Notes (Signed)
Patient transported to X-ray 

## 2020-11-18 NOTE — ED Provider Notes (Signed)
[facility] Emergency Department Provider Note   ____________________________________________   Event Date/Time   First MD Initiated Contact with Patient 11/18/20 1547     (approximate)  I have reviewed the triage vital signs and the nursing notes.   HISTORY  Chief Complaint Weakness    HPI [name] [name] is a 80 y.o. female with past medical history of hypertension, hyperlipidemia, diabetes, COPD on 2 L, CKD, and nonobstructive CAD who presents to the ED complaining of weakness.  Patient reports that she has had 2 to 3 days of weakness in both of her legs, making it difficult for her to walk.  She states she has been feeling more fatigued than usual and falling asleep frequently.  She denies any fevers, cough, chest pain, shortness of breath, vomiting, diarrhea, or dysuria.  She has not had any sick contacts.  She states she has been eating and drinking normally.  She denies falling or hitting her head.        Past Medical History:  Diagnosis Date   Abnormal CT lung screening 10/17/2015   COPD (chronic obstructive pulmonary disease) (HCC)    Coronary artery disease, non-occlusive    a. cath 2006: min nonobs CAD; b. cath 12/2010: cath LAD 50%, RCA 60%; c. 08/2013: Minimal luminal irregs, right dominant system with no significant CAD, diffuse luminal irregs noted. Normal EF 55%, no AS or MS.    Diabetes mellitus    Hyperlipemia    Followed by Dr. Rosanna Randy   Hypertension    Lung cancer [facility])    Macular degeneration    rt   Personal history of tobacco use, presenting hazards to health 10/15/2015   Stroke [facility])     Patient Active Problem List   Diagnosis Date Noted   Generalized weakness 07/31/2020   COVID-19 virus detected 07/31/2020   Type 2 diabetes mellitus with stage 3 chronic kidney disease ([place]) 07/31/2020   History of anemia due to CKD 07/31/2020   Neutropenia ([place]) 09/09/2019   Drug-induced polyneuropathy ([place]) 09/09/2019   Inflammatory  spondylopathy of lumbosacral region ([place]) 09/09/2019   COPD (chronic obstructive pulmonary disease) ([place]) 06/06/2019   GI bleed 12/28/2018   Syncope 12/27/2018   Blood per rectum 12/27/2018   Constipation 12/27/2018   Chest pain 09/23/2018   Stroke (cerebrum) ([place]) 05/06/2018   Malnutrition of moderate degree 03/31/2017   Acute respiratory failure with hypoxia ([place]) 03/30/2017   AKI (acute kidney injury) ([place]) 07/15/2016   Protein-calorie malnutrition, severe 02/08/2016   Primary cancer of right upper lobe of lung ([place]) 11/20/2015   Abnormal CT lung screening 10/17/2015   Personal history of tobacco use, presenting hazards to health 10/15/2015   Chronic vulvitis 09/26/2014   Allergic reaction 09/26/2014   Carotid stenosis 08/30/2014   Cervical nerve root disorder 08/10/2014   CAD in native artery 08/10/2014   B12 deficiency 08/10/2014   Back ache 08/10/2014   Bronchitis, chronic ([place]) 08/10/2014   Diabetes mellitus with polyneuropathy ([place]) 08/10/2014   Can't get food down 08/10/2014   Eczema of external ear 08/10/2014   Accumulation of fluid in tissues 08/10/2014   Gout 08/10/2014   Adult hypothyroidism 08/10/2014   Mononeuritis 08/10/2014   Muscle ache 08/10/2014   Disorder of peripheral nervous system 08/10/2014   Lesion of vulva 08/10/2014   Cervical spondylosis with radiculopathy 10/19/2013   COPD exacerbation ([place]) 03/29/2013   CAD (coronary artery disease) 06/22/2011   COPD (chronic obstructive pulmonary disease) with emphysema ([place]) 03/07/2010  CHEST PAIN UNSPECIFIED 07/22/2009   HLD (hyperlipidemia) 04/01/2009   Malaise and fatigue 04/01/2009   TOBACCO ABUSE 01/18/2009   HYPERTENSION, BENIGN 01/18/2009   CLAUDICATION 01/18/2009   Pain in limb 01/18/2009   CAFL (chronic airflow limitation) ([place]) 01/20/2007   Late effects of cerebrovascular disease 01/10/2007   Essential (primary) hypertension 12/22/2006    Past Surgical History:  Procedure Laterality Date    ABDOMINAL HYSTERECTOMY     ANTERIOR CERVICAL DECOMP/DISCECTOMY FUSION N/A 10/19/2013   Procedure: CERVICAL FIVE-SIX ANTERIOR CERVICAL DECOMPRESSION WITH FUSION INTERBODY PROSTHESIS PLATING AND PEEK CAGE;  Surgeon: Ophelia Charter, MD;  Location: [place] [facility];  Service: Neurosurgery;  Laterality: N/A;   BACK SURGERY  80's   BREAST CYST EXCISION Left    left negative    CARDIAC CATHETERIZATION  05/2004   CATARACT EXTRACTION Left    CHOLECYSTECTOMY     COLONOSCOPY N/A 12/29/2018   Procedure: COLONOSCOPY;  Surgeon: Toledo, Benay Pike, MD;  Location: [facility] ENDOSCOPY;  Service: Gastroenterology;  Laterality: N/A;   ENDARTERECTOMY Left 10/24/2014   Procedure: ENDARTERECTOMY CAROTID;  Surgeon: Algernon Huxley, MD;  Location: [facility] ORS;  Service: Vascular;  Laterality: Left;   ENDOBRONCHIAL ULTRASOUND N/A 11/14/2015   Procedure: ENDOBRONCHIAL ULTRASOUND;  Surgeon: [name], MD;  Location: [facility] ORS;  Service: Cardiopulmonary;  Laterality: N/A;   PERIPHERAL VASCULAR CATHETERIZATION N/A 12/04/2015   Procedure: Glori Luis Cath Insertion;  Surgeon: Algernon Huxley, MD;  Location: [place] CV LAB;  Service: Cardiovascular;  Laterality: N/A;   PORTA CATH REMOVAL N/A 05/17/2017   Procedure: PORTA CATH REMOVAL;  Surgeon: Algernon Huxley, MD;  Location: [place] CV LAB;  Service: Cardiovascular;  Laterality: N/A;   TONSILLECTOMY AND ADENOIDECTOMY     VESICOVAGINAL FISTULA CLOSURE W/ TAH      Prior to Admission medications   Medication Sig Start Date End Date Taking? Authorizing Provider  cephALEXin (KEFLEX) 500 MG capsule Take 1 capsule (500 mg total) by mouth 2 (two) times daily for 7 days. 11/18/20 11/25/20 Yes Blake Divine, MD  Accu-Chek FastClix Lancets MISC Check blood sugar up to 4 times daily. 06/20/20   Jerrol Banana., MD  albuterol (VENTOLIN HFA) 108 [phone] Base) MCG/ACT inhaler Inhale 2 puffs into the lungs every 4 (four) hours as needed for wheezing or shortness of breath. 09/26/18   Jerrol Banana., MD  amLODipine (NORVASC) 5 MG tablet TAKE 1 TABLET BY MOUTH EVERY DAY 09/10/20   Jerrol Banana., MD  aspirin EC 81 MG EC tablet Take 1 tablet (81 mg total) by mouth daily. 05/08/18   Mayo, Pete Pelt, MD  atorvastatin (LIPITOR) 40 MG tablet TAKE 1 TABLET BY MOUTH  DAILY AT 6 PM 04/18/20   Jerrol Banana., MD  Blood Glucose Monitoring Suppl (ACCU-CHEK GUIDE) w/Device KIT USE AS DIRECTED 08/16/19   Jerrol Banana., MD  clopidogrel (PLAVIX) 75 MG tablet TAKE 1 TABLET BY MOUTH  DAILY 03/20/20   Jerrol Banana., MD  donepezil (ARICEPT) 5 MG tablet TAKE 1 TABLET BY MOUTH EVERYDAY AT BEDTIME 03/14/20   Jerrol Banana., MD  doxycycline (VIBRA-TABS) 100 MG tablet Take 1 tablet (100 mg total) by mouth 2 (two) times daily. Patient not taking: Reported on 10/15/2020 09/17/20   Jerrol Banana., MD  gabapentin (NEURONTIN) 600 MG tablet TAKE ONE-HALF TABLET BY  MOUTH TWICE DAILY 09/04/20   Jerrol Banana., MD  glucose blood (ACCU-CHEK GUIDE) test strip USE  ONCE DAILY AS DIRECTED 11/03/20   Jerrol Banana., MD  ipratropium-albuterol (DUONEB) 0.5-2.5 (3) MG/3ML SOLN Take 3 mLs by nebulization every 4 (four) hours as needed. 09/10/20   Jerrol Banana., MD  isosorbide mononitrate (IMDUR) 60 MG 24 hr tablet TAKE 1 TABLET BY MOUTH  DAILY 10/03/20   Jerrol Banana., MD  magnesium oxide (MAG-OX) 400 (241.3 MG) MG tablet Take 400 mg by mouth 2 (two) times daily.  08/04/13   [provider]  metoprolol succinate (TOPROL-XL) 100 MG 24 hr tablet Take 1 tablet (100 mg total) by mouth daily. 08/16/19   Jerrol Banana., MD  mometasone (ELOCON) 0.1 % lotion Apply 4 drops topically at bedtime as needed (dry skin). Patient not taking: No sig reported 07/07/19   Jerrol Banana., MD  Multiple Vitamin (MULTIVITAMIN) tablet Take 0.5 tablets by mouth 2 (two) times daily.    [provider]  Multiple Vitamins-Minerals (ICAPS) CAPS Take 1 capsule by  mouth 2 (two) times daily.     [provider]  nitroGLYCERIN (NITROSTAT) 0.4 MG SL tablet PLACE 1 TABLET UNDER THE TONGUE EVERY 5 MINUTES AS NEEDED FOR CHEST PAIN 01/02/20   Jerrol Banana., MD  pantoprazole (PROTONIX) 40 MG tablet TAKE 1 TABLET BY MOUTH TWICE A DAY 09/10/20   Jerrol Banana., MD  predniSONE (DELTASONE) 20 MG tablet Take 1 tablet (20 mg total) by mouth daily with breakfast. Patient not taking: Reported on 10/15/2020 09/17/20   Jerrol Banana., MD  protective barrier (RESTORE) CREA [place] twice daily. 07/12/20   Trinna Post, PA-C  VITAMIN D PO Take by mouth daily.     [provider]  vitamin E 180 MG (400 UNITS) capsule Take 400 Units by mouth daily.    [provider]  Wound Dressings (COMFEEL PLUS ULCER DRESSING) PADS Apply once daily as needed. 07/12/20   Trinna Post, PA-C  zinc sulfate 220 (50 Zn) MG capsule Take 220 mg by mouth daily.    [provider]    Allergies Coconut fatty acids  Family History  Problem Relation Age of Onset   Stroke Mother    Heart attack Father    Heart attack Brother    Heart attack Brother    Lung cancer Maternal Grandfather    Heart attack Paternal Grandmother        MI    Social History Social History   Tobacco Use   Smoking status: Former    Packs/day: 1.00    Years: 50.00    Pack years: 50.00    Types: Cigarettes    Quit date: 10/03/2015    Years since quitting: 5.1   Smokeless tobacco: Never   Tobacco comments:    smokes 3 cigs daily 05/06/15. Pt instructed to quit.  Vaping Use   Vaping Use: Never used  Substance Use Topics   Alcohol use: No    Alcohol/week: 0.0 standard drinks   Drug use: No    Review of Systems  Constitutional: No fever/chills. Positive for generalized weakness and fatigue. Eyes: No visual changes. ENT: No sore throat. Cardiovascular: Denies chest pain. Respiratory: Denies shortness of breath. Gastrointestinal: No abdominal pain.   No nausea, no vomiting.  No diarrhea.  No constipation. Genitourinary: Negative for dysuria. Musculoskeletal: Negative for back pain. Skin: Negative for rash. Neurological: Negative for headaches, focal weakness or numbness.  ____________________________________________   PHYSICAL EXAM:  VITAL SIGNS: ED Triage Vitals [  11/18/20 1238]  Enc Vitals Group     BP 130/71     Pulse Rate 66     Resp 16     Temp 98.8 F (37.1 C)     Temp Source Oral     SpO2 96 %     Weight 125 lb (56.7 kg)     Height '5\' 4"'  (1.626 m)     Head Circumference      Peak Flow      Pain Score 0     Pain Loc      Pain Edu?      Excl. in [place]?     Constitutional: Alert and oriented. Eyes: Conjunctivae are normal. Head: Atraumatic. Nose: No congestion/rhinnorhea. Mouth/Throat: Mucous membranes are moist. Neck: Normal ROM Cardiovascular: Normal rate, regular rhythm. Grossly normal heart sounds.  2+ radial pulses bilaterally. Respiratory: Normal respiratory effort.  No retractions. Lungs CTAB. Gastrointestinal: Soft and nontender. No distention. Genitourinary: deferred Musculoskeletal: No lower extremity tenderness nor edema. Neurologic:  Normal speech and language.  Global weakness noted with no gross focal neurologic deficits appreciated. Skin:  Skin is warm, dry and intact. No rash noted. Psychiatric: Mood and affect are normal. Speech and behavior are normal.  ____________________________________________   LABS (all labs ordered are listed, but only abnormal results are displayed)  Labs Reviewed  BASIC METABOLIC PANEL - Abnormal; Notable for the following components:      Result Value   Chloride 94 (*)    CO2 40 (*)    Glucose, Bld 113 (*)    Creatinine, Ser 1.21 (*)    GFR, Estimated 46 (*)    All other components within normal limits  CBC - Abnormal; Notable for the following components:   RBC 3.65 (*)    Hemoglobin 11.2 (*)    All other components within normal limits  URINALYSIS,  COMPLETE (UACMP) WITH MICROSCOPIC - Abnormal; Notable for the following components:   Color, Urine YELLOW (*)    APPearance CLOUDY (*)    Ketones, ur 5 (*)    Protein, ur 30 (*)    Nitrite POSITIVE (*)    Leukocytes,Ua TRACE (*)    Bacteria, UA RARE (*)    All other components within normal limits  RESP PANEL BY RT-PCR (FLU A&B, COVID) ARPGX2  URINE CULTURE  HEPATIC FUNCTION PANEL  TROPONIN I (HIGH SENSITIVITY)   ____________________________________________  EKG  ED ECG REPORT I, Blake Divine, the attending physician, personally viewed and interpreted this ECG.   Date: 11/18/2020  EKG Time: 12:43  Rate: 62  Rhythm: normal sinus rhythm  Axis: Normal  Intervals:none  ST&T Change: None   PROCEDURES  Procedure(s) performed (including Critical Care):  Procedures   ____________________________________________   INITIAL IMPRESSION / ASSESSMENT AND PLAN / ED COURSE      80 year old female with past medical history of hypertension, hyperlipidemia, diabetes, CAD, CKD, and COPD on 2 L presents to the ED with 2 to 3 days of increasing weakness and fatigue making it difficult for her to walk.  She has no focal neurologic deficits on exam and I doubt stroke.  EKG shows no evidence of arrhythmia or ischemia, we will add on troponin given her history of CAD.  Initial labs unremarkable for AKI and we will hydrate with IV fluids.  We will also screen for infectious process with chest x-ray, UA, and testing for COVID-19.  COVID-19 testing is negative, chest x-ray reviewed by me and shows no infiltrate, edema, or effusion.  UA  appears consistent with a UTI, which could explain patient's weakness.  We will send for culture and patient was given a dose of IV Rocephin.  Husband is now at bedside and clarifies that patient has been nonambulatory for at least the past year.  She remains awake and alert with reassuring vital signs, no evidence of altered mental status or sepsis at this time.   She is appropriate for discharge home with PCP follow-up, we will treat with Keflex.  She was counseled to return to the ED for new worsening symptoms, patient and husband agree with plan.      ____________________________________________   FINAL CLINICAL IMPRESSION(S) / ED DIAGNOSES  Final diagnoses:  Weakness  Acute cystitis without hematuria     ED Discharge Orders          Ordered    cephALEXin (KEFLEX) 500 MG capsule  2 times daily        11/18/20 2114             Note:  This document was prepared using Dragon voice recognition software and may include unintentional dictation errors.    Blake Divine, MD 11/18/20 2116

## 2020-11-18 NOTE — ED Notes (Signed)
Dr. Charna Archer at bedside to update family on plan of care for discharge, husband reports patient will need to return home via EMS.

## 2020-11-18 NOTE — ED Notes (Signed)
Patient updated on transport status, lights dimmed and HOB adjusted.

## 2020-11-18 NOTE — ED Notes (Signed)
RN unable to obtain IV access x2.

## 2020-11-18 NOTE — ED Triage Notes (Signed)
Pt to ED for generalized weakness x1 week, states she could barely get out of bed. Alert and oriented. NAD noted Denies n/v/d Wears 3 L [place] at home  Sig pad not working, verbalizes understanding of MSE waiver

## 2020-11-18 NOTE — ED Notes (Signed)
[facility] states they cannot take patient home d/t patient living in [facility]. Attempting to call other means of transport.

## 2020-11-18 NOTE — ED Notes (Signed)
Patients husband [name] updated on transport status. He is home and can unlock door at any time.

## 2020-11-18 NOTE — ED Notes (Signed)
Patient assisted with using bed pan and repositioning in bed. Foul odor noted in urine specimen sent to lab. Family at bedside. Patient provided with meal tray.

## 2020-11-18 NOTE — Telephone Encounter (Signed)
Patient's husband called and says the patient has gotten weaker over the past 3-4 days. He says all she does is sit in the chair and sleep all day. He says she drinks little water, but she does drink a couple of drinks during the day. He says she's still soaking her diaper overnight and she urinated this morning. He says during the day she doesn't urinate much. He says she shakes and it has gotten worse over the past few days to the point is that she is not able to hold a cup. Her BP today is 1140/47, P 68, Oxygen sat on oxygen 98%. He says her oxygen drops in the 70's if it's off, so he tries to keep it on her when she's sleeping. I advised to take her to the ED or call 911 if unable to get her in the car. He says she's not walking so he will have to arrange for the ambulance to take her. He says to let Dr. Rosanna Randy know that she will be going to the ED. I advised he will be notified.  Reason for Disposition  Patient sounds very sick or weak to the triager  Answer Assessment - Initial Assessment Questions 1. DESCRIPTION: "Describe how you are feeling."     Weakness, shaking, unable to keep awake  2. SEVERITY: "How bad is it?"  "Can you stand and walk?"   - MILD - Feels weak or tired, but does not interfere with work, school or normal activities   - [place] to stand and walk; weakness interferes with work, school, or normal activities   - SEVERE - Unable to stand or walk     No 3. ONSET:  "When did the weakness begin?"      Couple weeks, worst in the past couple of days 4. CAUSE: "What do you think is causing the weakness?"     I don't know, maybe dehydrated 5. MEDICINES: "Have you recently started a new medicine or had a change in the amount of a medicine?"     No 6. OTHER SYMPTOMS: "Do you have any other symptoms?" (e.g., chest pain, fever, cough, SOB, vomiting, diarrhea, bleeding, other areas of pain)     Tremors has gotten worst 7. PREGNANCY: "Is there any chance you are pregnant?"  "When was your last menstrual period?"     No  Protocols used: Weakness (Generalized) and Fatigue-A-AH

## 2020-11-19 DIAGNOSIS — Z7401 Bed confinement status: Secondary | ICD-10-CM | POA: Diagnosis not present

## 2020-11-19 DIAGNOSIS — Z743 Need for continuous supervision: Secondary | ICD-10-CM | POA: Diagnosis not present

## 2020-11-19 DIAGNOSIS — R531 Weakness: Secondary | ICD-10-CM | POA: Diagnosis not present

## 2020-11-21 LAB — URINE CULTURE: Culture: >=100000 — AB

## 2020-11-23 DIAGNOSIS — R0602 Shortness of breath: Secondary | ICD-10-CM | POA: Diagnosis not present

## 2020-11-23 DIAGNOSIS — J449 Chronic obstructive pulmonary disease, unspecified: Secondary | ICD-10-CM | POA: Diagnosis not present

## 2020-11-24 ENCOUNTER — Inpatient Hospital Stay (HOSPITAL_COMMUNITY)
Admission: EM | Admit: 2020-11-24 | Discharge: 2020-11-27 | DRG: 312 | Disposition: A | Discharge status: Skilled Nursing Facility | Payer: Medicare Other | Attending: Internal Medicine | Admitting: Internal Medicine

## 2020-11-24 ENCOUNTER — Emergency Department (HOSPITAL_COMMUNITY): Payer: Medicare Other

## 2020-11-24 ENCOUNTER — Other Ambulatory Visit: Payer: Self-pay

## 2020-11-24 ENCOUNTER — Encounter (HOSPITAL_COMMUNITY): Payer: Self-pay

## 2020-11-24 DIAGNOSIS — H548 Legal blindness, as defined in USA: Secondary | ICD-10-CM | POA: Diagnosis present

## 2020-11-24 DIAGNOSIS — I129 Hypertensive chronic kidney disease with stage 1 through stage 4 chronic kidney disease, or unspecified chronic kidney disease: Secondary | ICD-10-CM | POA: Diagnosis present

## 2020-11-24 DIAGNOSIS — E039 Hypothyroidism, unspecified: Secondary | ICD-10-CM | POA: Diagnosis not present

## 2020-11-24 DIAGNOSIS — Z91018 Allergy to other foods: Secondary | ICD-10-CM

## 2020-11-24 DIAGNOSIS — N390 Urinary tract infection, site not specified: Secondary | ICD-10-CM | POA: Diagnosis present

## 2020-11-24 DIAGNOSIS — E1122 Type 2 diabetes mellitus with diabetic chronic kidney disease: Secondary | ICD-10-CM | POA: Diagnosis present

## 2020-11-24 DIAGNOSIS — N1831 Chronic kidney disease, stage 3a: Secondary | ICD-10-CM | POA: Diagnosis not present

## 2020-11-24 DIAGNOSIS — I6990 Unspecified sequelae of unspecified cerebrovascular disease: Secondary | ICD-10-CM

## 2020-11-24 DIAGNOSIS — Z20822 Contact with and (suspected) exposure to covid-19: Secondary | ICD-10-CM | POA: Diagnosis not present

## 2020-11-24 DIAGNOSIS — I6522 Occlusion and stenosis of left carotid artery: Secondary | ICD-10-CM | POA: Diagnosis present

## 2020-11-24 DIAGNOSIS — R55 Syncope and collapse: Secondary | ICD-10-CM | POA: Diagnosis present

## 2020-11-24 DIAGNOSIS — D631 Anemia in chronic kidney disease: Secondary | ICD-10-CM | POA: Diagnosis not present

## 2020-11-24 DIAGNOSIS — Z981 Arthrodesis status: Secondary | ICD-10-CM | POA: Diagnosis not present

## 2020-11-24 DIAGNOSIS — R29898 Other symptoms and signs involving the musculoskeletal system: Secondary | ICD-10-CM

## 2020-11-24 DIAGNOSIS — F0280 Dementia in other diseases classified elsewhere without behavioral disturbance: Secondary | ICD-10-CM | POA: Diagnosis present

## 2020-11-24 DIAGNOSIS — B9620 Unspecified Escherichia coli [E. coli] as the cause of diseases classified elsewhere: Secondary | ICD-10-CM | POA: Diagnosis present

## 2020-11-24 DIAGNOSIS — J9610 Chronic respiratory failure, unspecified whether with hypoxia or hypercapnia: Secondary | ICD-10-CM | POA: Diagnosis present

## 2020-11-24 DIAGNOSIS — I69391 Dysphagia following cerebral infarction: Secondary | ICD-10-CM | POA: Diagnosis not present

## 2020-11-24 DIAGNOSIS — I951 Orthostatic hypotension: Principal | ICD-10-CM | POA: Diagnosis present

## 2020-11-24 DIAGNOSIS — C3411 Malignant neoplasm of upper lobe, right bronchus or lung: Secondary | ICD-10-CM | POA: Diagnosis not present

## 2020-11-24 DIAGNOSIS — I10 Essential (primary) hypertension: Secondary | ICD-10-CM | POA: Diagnosis not present

## 2020-11-24 DIAGNOSIS — N189 Chronic kidney disease, unspecified: Secondary | ICD-10-CM

## 2020-11-24 DIAGNOSIS — M549 Dorsalgia, unspecified: Secondary | ICD-10-CM | POA: Diagnosis present

## 2020-11-24 DIAGNOSIS — J449 Chronic obstructive pulmonary disease, unspecified: Secondary | ICD-10-CM | POA: Diagnosis not present

## 2020-11-24 DIAGNOSIS — I252 Old myocardial infarction: Secondary | ICD-10-CM | POA: Diagnosis not present

## 2020-11-24 DIAGNOSIS — Z85118 Personal history of other malignant neoplasm of bronchus and lung: Secondary | ICD-10-CM

## 2020-11-24 DIAGNOSIS — J439 Emphysema, unspecified: Secondary | ICD-10-CM | POA: Diagnosis present

## 2020-11-24 DIAGNOSIS — S0993XA Unspecified injury of face, initial encounter: Secondary | ICD-10-CM | POA: Diagnosis not present

## 2020-11-24 DIAGNOSIS — R2681 Unsteadiness on feet: Secondary | ICD-10-CM | POA: Diagnosis not present

## 2020-11-24 DIAGNOSIS — E1142 Type 2 diabetes mellitus with diabetic polyneuropathy: Secondary | ICD-10-CM | POA: Diagnosis present

## 2020-11-24 DIAGNOSIS — Z823 Family history of stroke: Secondary | ICD-10-CM

## 2020-11-24 DIAGNOSIS — Z7401 Bed confinement status: Secondary | ICD-10-CM | POA: Diagnosis not present

## 2020-11-24 DIAGNOSIS — Z90710 Acquired absence of both cervix and uterus: Secondary | ICD-10-CM

## 2020-11-24 DIAGNOSIS — Z993 Dependence on wheelchair: Secondary | ICD-10-CM | POA: Diagnosis not present

## 2020-11-24 DIAGNOSIS — E785 Hyperlipidemia, unspecified: Secondary | ICD-10-CM | POA: Diagnosis present

## 2020-11-24 DIAGNOSIS — Z9981 Dependence on supplemental oxygen: Secondary | ICD-10-CM | POA: Diagnosis not present

## 2020-11-24 DIAGNOSIS — I493 Ventricular premature depolarization: Secondary | ICD-10-CM | POA: Diagnosis not present

## 2020-11-24 DIAGNOSIS — N183 Chronic kidney disease, stage 3 unspecified: Secondary | ICD-10-CM | POA: Diagnosis present

## 2020-11-24 DIAGNOSIS — E43 Unspecified severe protein-calorie malnutrition: Secondary | ICD-10-CM | POA: Diagnosis not present

## 2020-11-24 DIAGNOSIS — R404 Transient alteration of awareness: Secondary | ICD-10-CM | POA: Diagnosis not present

## 2020-11-24 DIAGNOSIS — R531 Weakness: Secondary | ICD-10-CM | POA: Diagnosis not present

## 2020-11-24 DIAGNOSIS — M109 Gout, unspecified: Secondary | ICD-10-CM | POA: Diagnosis not present

## 2020-11-24 DIAGNOSIS — H3530 Unspecified macular degeneration: Secondary | ICD-10-CM | POA: Diagnosis present

## 2020-11-24 DIAGNOSIS — Y92009 Unspecified place in unspecified non-institutional (private) residence as the place of occurrence of the external cause: Secondary | ICD-10-CM

## 2020-11-24 DIAGNOSIS — S199XXA Unspecified injury of neck, initial encounter: Secondary | ICD-10-CM | POA: Diagnosis not present

## 2020-11-24 DIAGNOSIS — Z7902 Long term (current) use of antithrombotics/antiplatelets: Secondary | ICD-10-CM

## 2020-11-24 DIAGNOSIS — M6281 Muscle weakness (generalized): Secondary | ICD-10-CM | POA: Diagnosis not present

## 2020-11-24 DIAGNOSIS — Z87891 Personal history of nicotine dependence: Secondary | ICD-10-CM

## 2020-11-24 DIAGNOSIS — M25552 Pain in left hip: Secondary | ICD-10-CM | POA: Diagnosis not present

## 2020-11-24 DIAGNOSIS — I2510 Atherosclerotic heart disease of native coronary artery without angina pectoris: Secondary | ICD-10-CM | POA: Diagnosis present

## 2020-11-24 DIAGNOSIS — R410 Disorientation, unspecified: Secondary | ICD-10-CM | POA: Diagnosis not present

## 2020-11-24 DIAGNOSIS — R0902 Hypoxemia: Secondary | ICD-10-CM | POA: Diagnosis not present

## 2020-11-24 DIAGNOSIS — Z7982 Long term (current) use of aspirin: Secondary | ICD-10-CM

## 2020-11-24 DIAGNOSIS — Z743 Need for continuous supervision: Secondary | ICD-10-CM | POA: Diagnosis not present

## 2020-11-24 DIAGNOSIS — Z801 Family history of malignant neoplasm of trachea, bronchus and lung: Secondary | ICD-10-CM

## 2020-11-24 DIAGNOSIS — Z8249 Family history of ischemic heart disease and other diseases of the circulatory system: Secondary | ICD-10-CM

## 2020-11-24 DIAGNOSIS — W19XXXA Unspecified fall, initial encounter: Secondary | ICD-10-CM

## 2020-11-24 DIAGNOSIS — I69828 Other speech and language deficits following other cerebrovascular disease: Secondary | ICD-10-CM | POA: Diagnosis not present

## 2020-11-24 DIAGNOSIS — J9690 Respiratory failure, unspecified, unspecified whether with hypoxia or hypercapnia: Secondary | ICD-10-CM | POA: Diagnosis not present

## 2020-11-24 DIAGNOSIS — Z862 Personal history of diseases of the blood and blood-forming organs and certain disorders involving the immune mechanism: Secondary | ICD-10-CM

## 2020-11-24 DIAGNOSIS — R1312 Dysphagia, oropharyngeal phase: Secondary | ICD-10-CM | POA: Diagnosis not present

## 2020-11-24 DIAGNOSIS — Z8673 Personal history of transient ischemic attack (TIA), and cerebral infarction without residual deficits: Secondary | ICD-10-CM

## 2020-11-24 LAB — COMPREHENSIVE METABOLIC PANEL
ALT: 20 U/L (ref 0–44)
AST: 20 U/L (ref 15–41)
Albumin: 3.1 g/dL — ABNORMAL LOW (ref 3.5–5.0)
Alkaline Phosphatase: 72 U/L (ref 38–126)
Anion gap: 6 (ref 5–15)
BUN: 23 mg/dL (ref 8–23)
CO2: 39 mmol/L — ABNORMAL HIGH (ref 22–32)
Calcium: 9 mg/dL (ref 8.9–10.3)
Chloride: 96 mmol/L — ABNORMAL LOW (ref 98–111)
Creatinine, Ser: 1.41 mg/dL — ABNORMAL HIGH (ref 0.44–1.00)
GFR, Estimated: 38 mL/min — ABNORMAL LOW (ref >60)
Glucose, Bld: 115 mg/dL — ABNORMAL HIGH (ref 70–99)
Potassium: 5 mmol/L (ref 3.5–5.1)
Sodium: 141 mmol/L (ref 135–145)
Total Bilirubin: 0.6 mg/dL (ref 0.3–1.2)
Total Protein: 7.1 g/dL (ref 6.5–8.1)

## 2020-11-24 LAB — CBC WITH DIFFERENTIAL/PLATELET
Abs Immature Granulocytes: 0.03 10*3/uL (ref 0.00–0.07)
Basophils Absolute: 0 10*3/uL (ref 0.0–0.1)
Basophils Relative: 0 %
Eosinophils Absolute: 0.2 10*3/uL (ref 0.0–0.5)
Eosinophils Relative: 3 %
HCT: 33.9 % — ABNORMAL LOW (ref 36.0–46.0)
Hemoglobin: 10.4 g/dL — ABNORMAL LOW (ref 12.0–15.0)
Immature Granulocytes: 1 %
Lymphocytes Relative: 10 %
Lymphs Abs: 0.6 10*3/uL — ABNORMAL LOW (ref 0.7–4.0)
MCH: 30.7 pg (ref 26.0–34.0)
MCHC: 30.7 g/dL (ref 30.0–36.0)
MCV: 100 fL (ref 80.0–100.0)
Monocytes Absolute: 0.5 10*3/uL (ref 0.1–1.0)
Monocytes Relative: 8 %
Neutro Abs: 5.2 10*3/uL (ref 1.7–7.7)
Neutrophils Relative %: 78 %
Platelets: 249 10*3/uL (ref 150–400)
RBC: 3.39 MIL/uL — ABNORMAL LOW (ref 3.87–5.11)
RDW: 15.4 % (ref 11.5–15.5)
WBC: 6.5 10*3/uL (ref 4.0–10.5)
nRBC: 0 % (ref 0.0–0.2)

## 2020-11-24 LAB — CBC
HCT: 32.9 % — ABNORMAL LOW (ref 36.0–46.0)
Hemoglobin: 10 g/dL — ABNORMAL LOW (ref 12.0–15.0)
MCH: 30.6 pg (ref 26.0–34.0)
MCHC: 30.4 g/dL (ref 30.0–36.0)
MCV: 100.6 fL — ABNORMAL HIGH (ref 80.0–100.0)
Platelets: 240 10*3/uL (ref 150–400)
RBC: 3.27 MIL/uL — ABNORMAL LOW (ref 3.87–5.11)
RDW: 15.5 % (ref 11.5–15.5)
WBC: 5.8 10*3/uL (ref 4.0–10.5)
nRBC: 0 % (ref 0.0–0.2)

## 2020-11-24 LAB — TSH: TSH: 3.353 u[IU]/mL (ref 0.350–4.500)

## 2020-11-24 LAB — URINALYSIS, ROUTINE W REFLEX MICROSCOPIC
Bilirubin Urine: NEGATIVE
Glucose, UA: NEGATIVE mg/dL
Hgb urine dipstick: NEGATIVE
Ketones, ur: NEGATIVE mg/dL
Leukocytes,Ua: NEGATIVE
Nitrite: NEGATIVE
Protein, ur: 100 mg/dL — AB
Specific Gravity, Urine: 1.019 (ref 1.005–1.030)
pH: 7 (ref 5.0–8.0)

## 2020-11-24 LAB — CREATININE, SERUM
Creatinine, Ser: 1.26 mg/dL — ABNORMAL HIGH (ref 0.44–1.00)
GFR, Estimated: 44 mL/min — ABNORMAL LOW (ref >60)

## 2020-11-24 LAB — RESP PANEL BY RT-PCR (FLU A&B, COVID) ARPGX2
Influenza A by PCR: NEGATIVE
Influenza B by PCR: NEGATIVE
SARS Coronavirus 2 by RT PCR: NEGATIVE

## 2020-11-24 LAB — HEMOGLOBIN A1C
Hgb A1c MFr Bld: 6.6 % — ABNORMAL HIGH (ref 4.8–5.6)
Mean Plasma Glucose: 142.72 mg/dL

## 2020-11-24 LAB — MAGNESIUM: Magnesium: 2.8 mg/dL — ABNORMAL HIGH (ref 1.7–2.4)

## 2020-11-24 LAB — CBG MONITORING, ED: Glucose-Capillary: 115 mg/dL — ABNORMAL HIGH (ref 70–99)

## 2020-11-24 LAB — LACTIC ACID, PLASMA: Lactic Acid, Venous: 1.7 mmol/L (ref 0.5–1.9)

## 2020-11-24 LAB — TROPONIN I (HIGH SENSITIVITY)
Troponin I (High Sensitivity): 14 ng/L (ref <18)
Troponin I (High Sensitivity): 17 ng/L (ref <18)

## 2020-11-24 LAB — CK: Total CK: 44 U/L (ref 38–234)

## 2020-11-24 MED ORDER — ADULT MULTIVITAMIN W/MINERALS CH
0.5000 | ORAL_TABLET | Freq: Two times a day (BID) | ORAL | Status: DC
  Administered 2020-11-24 – 2020-11-27 (×6): 0.5 via ORAL
  Filled 2020-11-24 (×6): qty 1

## 2020-11-24 MED ORDER — VITAMIN E 45 MG (100 UNIT) PO CAPS
400.0000 [IU] | ORAL_CAPSULE | Freq: Every day | ORAL | Status: DC
  Administered 2020-11-24 – 2020-11-27 (×4): 400 [IU] via ORAL
  Filled 2020-11-24 (×4): qty 4

## 2020-11-24 MED ORDER — PREDNISONE 20 MG PO TABS: 20.0000 mg | ORAL_TABLET | Freq: Every day | ORAL | Status: DC

## 2020-11-24 MED ORDER — VITAMIN D 25 MCG (1000 UNIT) PO TABS
1000.0000 [IU] | ORAL_TABLET | Freq: Every day | ORAL | Status: DC
  Administered 2020-11-24 – 2020-11-27 (×4): 1000 [IU] via ORAL
  Filled 2020-11-24 (×4): qty 1

## 2020-11-24 MED ORDER — AMLODIPINE BESYLATE 5 MG PO TABS
5.0000 mg | ORAL_TABLET | Freq: Every day | ORAL | Status: DC
  Administered 2020-11-24 – 2020-11-27 (×4): 5 mg via ORAL
  Filled 2020-11-24 (×4): qty 1

## 2020-11-24 MED ORDER — DONEPEZIL HCL 5 MG PO TABS
5.0000 mg | ORAL_TABLET | Freq: Every day | ORAL | Status: DC
  Administered 2020-11-24 – 2020-11-26 (×3): 5 mg via ORAL
  Filled 2020-11-24 (×3): qty 1

## 2020-11-24 MED ORDER — SODIUM CHLORIDE 0.9 % IV BOLUS
500.0000 mL | Freq: Once | INTRAVENOUS | Status: AC
  Administered 2020-11-24: 500 mL via INTRAVENOUS

## 2020-11-24 MED ORDER — CEPHALEXIN 500 MG PO CAPS
500.0000 mg | ORAL_CAPSULE | Freq: Two times a day (BID) | ORAL | Status: DC
  Administered 2020-11-24 – 2020-11-27 (×6): 500 mg via ORAL
  Filled 2020-11-24 (×6): qty 1

## 2020-11-24 MED ORDER — SODIUM CHLORIDE 0.9 % IV SOLN: 250.0000 mL | INTRAVENOUS | Status: DC | PRN

## 2020-11-24 MED ORDER — SODIUM CHLORIDE 0.9% FLUSH
3.0000 mL | Freq: Two times a day (BID) | INTRAVENOUS | Status: DC
  Administered 2020-11-24 – 2020-11-25 (×2): 3 mL via INTRAVENOUS

## 2020-11-24 MED ORDER — ATORVASTATIN CALCIUM 40 MG PO TABS
40.0000 mg | ORAL_TABLET | Freq: Every day | ORAL | Status: DC
  Administered 2020-11-24 – 2020-11-26 (×3): 40 mg via ORAL
  Filled 2020-11-24 (×3): qty 1

## 2020-11-24 MED ORDER — PANTOPRAZOLE SODIUM 40 MG PO TBEC
40.0000 mg | DELAYED_RELEASE_TABLET | Freq: Two times a day (BID) | ORAL | Status: DC
  Administered 2020-11-24 – 2020-11-27 (×6): 40 mg via ORAL
  Filled 2020-11-24 (×6): qty 1

## 2020-11-24 MED ORDER — INSULIN ASPART 100 UNIT/ML IJ SOLN
0.0000 [IU] | Freq: Three times a day (TID) | INTRAMUSCULAR | Status: DC
  Administered 2020-11-25: 3 [IU] via SUBCUTANEOUS
  Administered 2020-11-25: 2 [IU] via SUBCUTANEOUS

## 2020-11-24 MED ORDER — SODIUM CHLORIDE 0.9% FLUSH: 3.0000 mL | INTRAVENOUS | Status: DC | PRN

## 2020-11-24 MED ORDER — CLOPIDOGREL BISULFATE 75 MG PO TABS
75.0000 mg | ORAL_TABLET | Freq: Every day | ORAL | Status: DC
  Administered 2020-11-24 – 2020-11-27 (×4): 75 mg via ORAL
  Filled 2020-11-24 (×4): qty 1

## 2020-11-24 MED ORDER — METOPROLOL SUCCINATE ER 50 MG PO TB24
100.0000 mg | ORAL_TABLET | Freq: Every day | ORAL | Status: DC
  Administered 2020-11-24 – 2020-11-25 (×2): 100 mg via ORAL
  Filled 2020-11-24 (×2): qty 2

## 2020-11-24 MED ORDER — ASPIRIN EC 81 MG PO TBEC
81.0000 mg | DELAYED_RELEASE_TABLET | Freq: Every day | ORAL | Status: DC
  Administered 2020-11-24 – 2020-11-27 (×4): 81 mg via ORAL
  Filled 2020-11-24 (×4): qty 1

## 2020-11-24 MED ORDER — GABAPENTIN 300 MG PO CAPS
300.0000 mg | ORAL_CAPSULE | Freq: Two times a day (BID) | ORAL | Status: DC
  Administered 2020-11-24: 300 mg via ORAL
  Filled 2020-11-24: qty 1

## 2020-11-24 MED ORDER — IPRATROPIUM-ALBUTEROL 0.5-2.5 (3) MG/3ML IN SOLN: 3.0000 mL | RESPIRATORY_TRACT | Status: DC | PRN

## 2020-11-24 MED ORDER — ZINC SULFATE 220 (50 ZN) MG PO CAPS
220.0000 mg | ORAL_CAPSULE | Freq: Every day | ORAL | Status: DC
  Administered 2020-11-24 – 2020-11-27 (×4): 220 mg via ORAL
  Filled 2020-11-24 (×4): qty 1

## 2020-11-24 NOTE — ED Notes (Signed)
Patient transported to X-ray 

## 2020-11-24 NOTE — ED Notes (Signed)
Tried to call 5N but no answer at [phone], [phone], and [phone]

## 2020-11-24 NOTE — ED Provider Notes (Addendum)
Patient care assumed from [facility] at shift change.  Please see his note for complete history and detailed physical exam.  Patient had a syncopal episode and is now having altered mental status changes.  She recently was seen 11/18/2020 and diagnosed with acute cystitis and started on Keflex.  Patient needs admission for syncope work-up, we have been unable to contact the family to obtain collateral information about her baseline mental status.  Physical Exam  BP (!) 145/64   Pulse 64   Temp 98.3 F (36.8 C) (Oral)   Resp 12   SpO2 98%   Physical Exam Vitals and nursing note reviewed. Exam conducted with a chaperone present.  Constitutional:      General: She is not in acute distress.    Appearance: Normal appearance.  HENT:     Head: Normocephalic and atraumatic.  Eyes:     General: No scleral icterus.    Extraocular Movements: Extraocular movements intact.     Pupils: Pupils are equal, round, and reactive to light.  Skin:    Coloration: Skin is not jaundiced.  Neurological:     Mental Status: She is alert.     Coordination: Coordination normal.     Comments: Unclear if this is baseline. Follows commands, no dysarthria     ED Course/Procedures   Clinical Course as of 11/24/20 1841  Nancy Fetter Nov 24, 2020  1146 Govea,[name] (Spouse)  [phone] Straight to VM. [BM]  1431 Picardi,[name] (Spouse)  [phone] (Mobile) Straight to VM [BM]  1431 [phone] (Home Phone)  No answer [BM]    Clinical Course User Index [BM] Deliah Boston, PA-C    Procedures  MDM  Plan is to admit the patient to hospitalist service for syncope work-up.  Hospitalist has accepted patient.      Sherrill Raring, PA-C 11/24/20 1651    Sherrill Raring, PA-C 11/24/20 1842    Milton Ferguson, MD 11/25/20 [phone]

## 2020-11-24 NOTE — ED Notes (Signed)
Tried to call 5N but not answer at [phone]

## 2020-11-24 NOTE — Plan of Care (Signed)

## 2020-11-24 NOTE — H&P (Addendum)
History and Physical  [name] [name] WNU:[id number] DOB: 1940-10-16 DOA: 11/24/2020  Referring physician: Sherrill Raring PCP: Jerrol Banana., MD  Outpatient Specialists: None Patient coming from: Home & is not able to ambulate   Chief Complaint: Generalized weakness lower extremity weakness and fall   HPI: [name] [name] is a 80 y.o. female with medical history significant for COPD with chronic respiratory failure on 3 L/min oxygen at home, lung cancer 4 years ago in remission,Coronary artery disease, hypertension, diabetes mellitus, hyperlipidemia, CVA, dementia, chronic debility wheelchair-bound for years.  Who presented to the emergency department with progressive generalized weakness over the past week with fall this morning due to knee buckling.  Patient denies any dizziness or headache or passing out or loss of consciousness.  Patient has chronic debility patient has been wheelchair-bound for years but she was able to  participate in changing her position with the help of her husband who stated he has been doing this for years to help move her from 1 position to the other and getting into the wheelchair but yesterday she got weak and could hardly stand and this morning when he tried to get her to stand up with the walker like they usually do her legs buckle and he lost control and could not catch her and she fell back into bed  but missed the bed and fell on the floor.  She hit her head on the side as she slipped down to the floor.  Her husband stated that he has been taking care of her for years helping her transfer from 1 position to the other and she usually participates in it but yesterday and today she could hardly do anything and she he himself is not very strong and he is not able to continue to take care of her at home and he is requesting for rehab.  He stated that they have had multiple home physical therapies on numerous occasions she will get better but then relapsed back into  weakness so he wants her to be admitted for rehab as patient's husband said he is no longer able to take care of her at home he does have some help with the daughter sometimes but the daughter is also sick with multiple sclerosis. Also patient was recently seen at an urgent care in [facility] where she was treated  for urinary tract infection with Keflex in the setting have not completed that. History is from her husband is at bedside   ED Course: EKG did not show any rhythm abnormality.  CT of the head and neck were negative.  Left hip and pelvic x-ray were negative chest x-ray was also negative except for chronic bronchogenic changes particularly in the right lung base      CBC was normal as well as hemoglobin.  Her creatinine is however elevated to 1.41 magnesium slightly elevated 2.8 troponin was negative CK was negative lactic acid was negative.  UA showed haziness and red bacteria   Review of Systems:   Pt denies any headache dizziness nausea vomiting or diarrhea.  Review of systems are otherwise negative   Past Medical History:  Diagnosis Date   Abnormal CT lung screening 10/17/2015   COPD (chronic obstructive pulmonary disease) (HCC)    Coronary artery disease, non-occlusive    a. cath 2006: min nonobs CAD; b. cath 12/2010: cath LAD 50%, RCA 60%; c. 08/2013: Minimal luminal irregs, right dominant system with no significant CAD, diffuse luminal irregs noted. Normal  EF 55%, no AS or MS.    Diabetes mellitus    Hyperlipemia    Followed by Dr. Rosanna Randy   Hypertension    Lung cancer [facility])    Macular degeneration    rt   Personal history of tobacco use, presenting hazards to health 10/15/2015   Stroke [facility])    Past Surgical History:  Procedure Laterality Date   ABDOMINAL HYSTERECTOMY     ANTERIOR CERVICAL DECOMP/DISCECTOMY FUSION N/A 10/19/2013   Procedure: CERVICAL FIVE-SIX ANTERIOR CERVICAL DECOMPRESSION WITH FUSION INTERBODY PROSTHESIS PLATING AND PEEK CAGE;  Surgeon:  Ophelia Charter, MD;  Location: [place] NEURO ORS;  Service: Neurosurgery;  Laterality: N/A;   BACK SURGERY  80's   BREAST CYST EXCISION Left    left negative    CARDIAC CATHETERIZATION  05/2004   CATARACT EXTRACTION Left    CHOLECYSTECTOMY     COLONOSCOPY N/A 12/29/2018   Procedure: COLONOSCOPY;  Surgeon: Toledo, Benay Pike, MD;  Location: [facility] ENDOSCOPY;  Service: Gastroenterology;  Laterality: N/A;   ENDARTERECTOMY Left 10/24/2014   Procedure: ENDARTERECTOMY CAROTID;  Surgeon: Algernon Huxley, MD;  Location: [facility] ORS;  Service: Vascular;  Laterality: Left;   ENDOBRONCHIAL ULTRASOUND N/A 11/14/2015   Procedure: ENDOBRONCHIAL ULTRASOUND;  Surgeon: [name], MD;  Location: [facility] ORS;  Service: Cardiopulmonary;  Laterality: N/A;   PERIPHERAL VASCULAR CATHETERIZATION N/A 12/04/2015   Procedure: Glori Luis Cath Insertion;  Surgeon: Algernon Huxley, MD;  Location: [place] CV LAB;  Service: Cardiovascular;  Laterality: N/A;   PORTA CATH REMOVAL N/A 05/17/2017   Procedure: PORTA CATH REMOVAL;  Surgeon: Algernon Huxley, MD;  Location: [place] CV LAB;  Service: Cardiovascular;  Laterality: N/A;   TONSILLECTOMY AND ADENOIDECTOMY     VESICOVAGINAL FISTULA CLOSURE W/ TAH      Social History:  reports that she quit smoking about 5 years ago. Her smoking use included cigarettes. She has a 50.00 pack-year smoking history. She has never used smokeless tobacco. She reports that she does not drink alcohol and does not use drugs.   Allergies  Allergen Reactions   Coconut Fatty Acids Swelling and Other (See Comments)    Throat swells    Family History  Problem Relation Age of Onset   Stroke Mother    Heart attack Father    Heart attack Brother    Heart attack Brother    Lung cancer Maternal Grandfather    Heart attack Paternal Grandmother        MI      Prior to Admission medications   Medication Sig Start Date End Date Taking? Authorizing Provider  albuterol (VENTOLIN HFA) 108 (90 Base) MCG/ACT  inhaler Inhale 2 puffs into the lungs every 4 (four) hours as needed for wheezing or shortness of breath. 09/26/18  Yes Jerrol Banana., MD  amLODipine (NORVASC) 5 MG tablet TAKE 1 TABLET BY MOUTH EVERY DAY 09/10/20  Yes Jerrol Banana., MD  aspirin EC 81 MG EC tablet Take 1 tablet (81 mg total) by mouth daily. 05/08/18  Yes Mayo, Pete Pelt, MD  atorvastatin (LIPITOR) 40 MG tablet TAKE 1 TABLET BY MOUTH  DAILY AT 6 PM 04/18/20  Yes Jerrol Banana., MD  cephALEXin (KEFLEX) 500 MG capsule Take 1 capsule (500 mg total) by mouth 2 (two) times daily for 7 days. 11/18/20 11/25/20 Yes Blake Divine, MD  clopidogrel (PLAVIX) 75 MG tablet TAKE 1 TABLET BY MOUTH  DAILY 03/20/20  Yes Jerrol Banana., MD  donepezil (  ARICEPT) 5 MG tablet TAKE 1 TABLET BY MOUTH EVERYDAY AT BEDTIME 03/14/20  Yes Jerrol Banana., MD  gabapentin (NEURONTIN) 600 MG tablet TAKE ONE-HALF TABLET BY  MOUTH TWICE DAILY 09/04/20  Yes Jerrol Banana., MD  ipratropium-albuterol (DUONEB) 0.5-2.5 (3) MG/3ML SOLN Take 3 mLs by nebulization every 4 (four) hours as needed. 09/10/20  Yes Jerrol Banana., MD  isosorbide mononitrate (IMDUR) 60 MG 24 hr tablet TAKE 1 TABLET BY MOUTH  DAILY 10/03/20  Yes Jerrol Banana., MD  magnesium oxide (MAG-OX) 400 (241.3 MG) MG tablet Take 400 mg by mouth 2 (two) times daily.  08/04/13  Yes [provider]  metoprolol succinate (TOPROL-XL) 100 MG 24 hr tablet Take 1 tablet (100 mg total) by mouth daily. 08/16/19  Yes Jerrol Banana., MD  Multiple Vitamin (MULTIVITAMIN) tablet Take 0.5 tablets by mouth 2 (two) times daily.   Yes [provider]  nitroGLYCERIN (NITROSTAT) 0.4 MG SL tablet PLACE 1 TABLET UNDER THE TONGUE EVERY 5 MINUTES AS NEEDED FOR CHEST PAIN 01/02/20  Yes Jerrol Banana., MD  pantoprazole (PROTONIX) 40 MG tablet TAKE 1 TABLET BY MOUTH TWICE A DAY 09/10/20  Yes Jerrol Banana., MD  VITAMIN D PO Take by mouth daily.    Yes  [provider]  vitamin E 180 MG (400 UNITS) capsule Take 400 Units by mouth daily.   Yes [provider]  zinc sulfate 220 (50 Zn) MG capsule Take 220 mg by mouth daily.   Yes [provider]  Accu-Chek FastClix Lancets MISC Check blood sugar up to 4 times daily. 06/20/20   Jerrol Banana., MD  Blood Glucose Monitoring Suppl (ACCU-CHEK GUIDE) w/Device KIT USE AS DIRECTED 08/16/19   Jerrol Banana., MD  doxycycline (VIBRA-TABS) 100 MG tablet Take 1 tablet (100 mg total) by mouth 2 (two) times daily. Patient not taking: No sig reported 09/17/20   Jerrol Banana., MD  glucose blood (ACCU-CHEK GUIDE) test strip USE ONCE DAILY AS DIRECTED 11/03/20   Jerrol Banana., MD  mometasone (ELOCON) 0.1 % lotion Apply 4 drops topically at bedtime as needed (dry skin). Patient not taking: No sig reported 07/07/19   Jerrol Banana., MD  predniSONE (DELTASONE) 20 MG tablet Take 1 tablet (20 mg total) by mouth daily with breakfast. Patient not taking: No sig reported 09/17/20   Jerrol Banana., MD  protective barrier (RESTORE) CREA Apple twice daily. 07/12/20   Trinna Post, PA-C  Wound Dressings (COMFEEL PLUS ULCER DRESSING) PADS Apply once daily as needed. 07/12/20   Trinna Post, PA-C    Physical Exam: BP (!) 128/52   Pulse 61   Temp 98.3 F (36.8 C) (Oral)   Resp 11   SpO2 100%   Exam:  General: 80 y.o. year-old female well developed well nourished in no acute distress.  Alert and oriented x3. HEENT: She has a slight bruise in the upper eyelid on the left upper eyelid Cardiovascular: Regular rate and rhythm with no rubs or gallops.  No thyromegaly or JVD noted.   Respiratory: Clear to auscultation with no wheezes or rales. Good inspiratory effort. Abdomen: Soft nontender nondistended with normal bowel sounds x4 quadrants. Musculoskeletal: No lower extremity edema. 2/4 pulses in all 4 extremities. Skin: No ulcerative lesions noted  or rashes, Psychiatry: Mood is appropriate for condition and setting  Labs on Admission:  Basic Metabolic Panel: Recent Labs  Lab 11/18/20 1259 11/24/20 1143  NA 141 141  K 5.1 5.0  CL 94* 96*  CO2 40* 39*  GLUCOSE 113* 115*  BUN 18 23  CREATININE 1.21* 1.41*  CALCIUM 9.1 9.0  MG  --  2.8*   Liver Function Tests: Recent Labs  Lab 11/18/20 1259 11/24/20 1143  AST 18 20  ALT 18 20  ALKPHOS 71 72  BILITOT 0.8 0.6  PROT 7.3 7.1  ALBUMIN 3.5 3.1*   No results for input(s): LIPASE, AMYLASE in the last 168 hours. No results for input(s): AMMONIA in the last 168 hours. CBC: Recent Labs  Lab 11/18/20 1259 11/24/20 1615  WBC 9.2 6.5  NEUTROABS  --  5.2  HGB 11.2* 10.4*  HCT 36.2 33.9*  MCV 99.2 100.0  PLT 251 249   Cardiac Enzymes: Recent Labs  Lab 11/24/20 1143  CKTOTAL 44    BNP (last 3 results) No results for input(s): BNP in the last 8760 hours.  ProBNP (last 3 results) No results for input(s): PROBNP in the last 8760 hours.  CBG: Recent Labs  Lab 11/24/20 1152  GLUCAP 115*    Radiological Exams on Admission: DG Chest 1 View  Result Date: 11/24/2020 CLINICAL DATA:  [place] at home today. Left hip pain. History of COPD. EXAM: CHEST  1 VIEW COMPARISON:  11/18/2020. FINDINGS: Cardiac silhouette is normal in size. No mediastinal or hilar masses. Interstitial thickening in the lower lungs, most evident extending from the inferior right hilum with evidence of bronchial wall thickening. These findings are similar to the prior exam. No lung consolidation. No evidence of pulmonary edema. No convincing pleural effusion and no pneumothorax. Skeletal structures are grossly intact. IMPRESSION: 1. No acute cardiopulmonary disease. 2. Chronic bronchitic changes most evident in the right lung base. Electronically Signed   By: Lajean Manes M.D.   On: 11/24/2020 12:47   CT HEAD WO CONTRAST (5MM)  Result Date: 11/24/2020 CLINICAL DATA:  Facial trauma; Neck  trauma (Age >= 65y) EXAM: CT HEAD WITHOUT CONTRAST CT MAXILLOFACIAL WITHOUT CONTRAST CT CERVICAL SPINE WITHOUT CONTRAST TECHNIQUE: Multidetector CT imaging of the head, cervical spine, and maxillofacial structures were performed using the standard protocol without intravenous contrast. Multiplanar CT image reconstructions of the cervical spine and maxillofacial structures were also generated. COMPARISON:  Head CT 07/31/2020, cervical spine CT 05/06/2018 FINDINGS: CT HEAD FINDINGS Brain: No evidence of acute intracranial hemorrhage or extra-axial collection.No evidence of mass lesion/concern mass effect.The ventricles are unchanged in size.Scattered subcortical and periventricular white matter hypodensities, nonspecific but likely sequela of chronic small vessel ischemic disease.Mild cerebral atrophy Vascular: No hyperdense vessel or unexpected calcification. Skull: Normal. Negative for fracture or focal lesion. Other: None. CT MAXILLOFACIAL FINDINGS Osseous: No fracture or mandibular dislocation. No destructive process. Orbits: Prior bilateral lens surgeries. No traumatic or inflammatory finding. Sinuses: Mild mucosal thickening of the ethmoid air cells and sphenoid sinuses. Soft tissues: Negative CT CERVICAL SPINE FINDINGS Alignment: Normal. Skull base and vertebrae: Prior ACDF at C5-C6. Intact hardware without evidence of loosening. There is no evidence of acute cervical spine fracture. There is mild bilateral facet arthropathy. Mild degenerative disc disease at C4-C5 and C6-C7. Soft tissues and spinal canal: No prevertebral fluid or swelling. No visible canal hematoma. Disc levels: Preserved disc spaces.No large disc herniation, significant spinal canal or neuroforaminal narrowing. Upper chest: Paraseptal emphysema in the lung apices. Other: 1.3 cm right posterior thyroid nodule, unchanged from prior chest CT and  does not require follow-up. IMPRESSION: No acute intracranial abnormality.  No acute facial fracture.  No acute cervical spine fracture. Prior ACDF at C5-C6 without evidence of hardware complication. Electronically Signed   By: Maurine Simmering M.D.   On: 11/24/2020 14:13   CT Cervical Spine Wo Contrast  Result Date: 11/24/2020 CLINICAL DATA:  Facial trauma; Neck trauma (Age >= 65y) EXAM: CT HEAD WITHOUT CONTRAST CT MAXILLOFACIAL WITHOUT CONTRAST CT CERVICAL SPINE WITHOUT CONTRAST TECHNIQUE: Multidetector CT imaging of the head, cervical spine, and maxillofacial structures were performed using the standard protocol without intravenous contrast. Multiplanar CT image reconstructions of the cervical spine and maxillofacial structures were also generated. COMPARISON:  Head CT 07/31/2020, cervical spine CT 05/06/2018 FINDINGS: CT HEAD FINDINGS Brain: No evidence of acute intracranial hemorrhage or extra-axial collection.No evidence of mass lesion/concern mass effect.The ventricles are unchanged in size.Scattered subcortical and periventricular white matter hypodensities, nonspecific but likely sequela of chronic small vessel ischemic disease.Mild cerebral atrophy Vascular: No hyperdense vessel or unexpected calcification. Skull: Normal. Negative for fracture or focal lesion. Other: None. CT MAXILLOFACIAL FINDINGS Osseous: No fracture or mandibular dislocation. No destructive process. Orbits: Prior bilateral lens surgeries. No traumatic or inflammatory finding. Sinuses: Mild mucosal thickening of the ethmoid air cells and sphenoid sinuses. Soft tissues: Negative CT CERVICAL SPINE FINDINGS Alignment: Normal. Skull base and vertebrae: Prior ACDF at C5-C6. Intact hardware without evidence of loosening. There is no evidence of acute cervical spine fracture. There is mild bilateral facet arthropathy. Mild degenerative disc disease at C4-C5 and C6-C7. Soft tissues and spinal canal: No prevertebral fluid or swelling. No visible canal hematoma. Disc levels: Preserved disc spaces.No large disc herniation, significant spinal canal or  neuroforaminal narrowing. Upper chest: Paraseptal emphysema in the lung apices. Other: 1.3 cm right posterior thyroid nodule, unchanged from prior chest CT and does not require follow-up. IMPRESSION: No acute intracranial abnormality.  No acute facial fracture. No acute cervical spine fracture. Prior ACDF at C5-C6 without evidence of hardware complication. Electronically Signed   By: Maurine Simmering M.D.   On: 11/24/2020 14:13   DG Hip Unilat W or Wo Pelvis 2-3 Views Left  Result Date: 11/24/2020 CLINICAL DATA:  [place] at home today.  Left hip pain. EXAM: DG HIP (WITH OR WITHOUT PELVIS) 2-3V LEFT COMPARISON:  None. FINDINGS: No convincing fracture.  No bone lesion. Hip joints, SI joints and symphysis pubis are normally spaced and aligned. Skeletal structures are demineralized. Soft tissues are unremarkable other than arterial atherosclerotic calcifications. IMPRESSION: 1. No fracture or dislocation. Electronically Signed   By: Lajean Manes M.D.   On: 11/24/2020 12:48   CT Maxillofacial Wo Contrast  Result Date: 11/24/2020 CLINICAL DATA:  Facial trauma; Neck trauma (Age >= 65y) EXAM: CT HEAD WITHOUT CONTRAST CT MAXILLOFACIAL WITHOUT CONTRAST CT CERVICAL SPINE WITHOUT CONTRAST TECHNIQUE: Multidetector CT imaging of the head, cervical spine, and maxillofacial structures were performed using the standard protocol without intravenous contrast. Multiplanar CT image reconstructions of the cervical spine and maxillofacial structures were also generated. COMPARISON:  Head CT 07/31/2020, cervical spine CT 05/06/2018 FINDINGS: CT HEAD FINDINGS Brain: No evidence of acute intracranial hemorrhage or extra-axial collection.No evidence of mass lesion/concern mass effect.The ventricles are unchanged in size.Scattered subcortical and periventricular white matter hypodensities, nonspecific but likely sequela of chronic small vessel ischemic disease.Mild cerebral atrophy Vascular: No hyperdense vessel or unexpected calcification.  Skull: Normal. Negative for fracture or focal lesion. Other: None. CT MAXILLOFACIAL FINDINGS Osseous: No fracture or mandibular dislocation. No destructive process. Orbits: Prior bilateral  lens surgeries. No traumatic or inflammatory finding. Sinuses: Mild mucosal thickening of the ethmoid air cells and sphenoid sinuses. Soft tissues: Negative CT CERVICAL SPINE FINDINGS Alignment: Normal. Skull base and vertebrae: Prior ACDF at C5-C6. Intact hardware without evidence of loosening. There is no evidence of acute cervical spine fracture. There is mild bilateral facet arthropathy. Mild degenerative disc disease at C4-C5 and C6-C7. Soft tissues and spinal canal: No prevertebral fluid or swelling. No visible canal hematoma. Disc levels: Preserved disc spaces.No large disc herniation, significant spinal canal or neuroforaminal narrowing. Upper chest: Paraseptal emphysema in the lung apices. Other: 1.3 cm right posterior thyroid nodule, unchanged from prior chest CT and does not require follow-up. IMPRESSION: No acute intracranial abnormality.  No acute facial fracture. No acute cervical spine fracture. Prior ACDF at C5-C6 without evidence of hardware complication. Electronically Signed   By: Maurine Simmering M.D.   On: 11/24/2020 14:13    EKG: Independently reviewed.  No significant abnormality no STEMI.     Assessment/Plan Present on Admission:  COPD (chronic obstructive pulmonary disease) with emphysema (HCC)  CAD (coronary artery disease)  HYPERTENSION, BENIGN  Back ache  Diabetes mellitus with polyneuropathy (HCC)  Type 2 diabetes mellitus with stage 3 chronic kidney disease (HCC)  Weakness of both legs  Principal Problem:   Weakness of both legs Active Problems:   HYPERTENSION, BENIGN   COPD (chronic obstructive pulmonary disease) with emphysema (HCC)   CAD (coronary artery disease)   Back ache   Late effects of cerebrovascular disease   Diabetes mellitus with polyneuropathy (HCC)   Type 2 diabetes  mellitus with stage 3 chronic kidney disease (HCC)   History of anemia due to CKD   Fall at home, initial encounter  #1 generalized weakness with fall due to knee buckling.  Patient denies any dizziness or headache or passing out or loss of consciousness  #2 chronic debility patient has been wheelchair-bound for years but she was able to help to participate in changing her position with the help of her husband who stated he has been doing this for years but yesterday she got weak and could hardly stand and this morning when he tried to get her to stand up with the walker like they usually do her legs buckle and lost control and could not catch her and she fell back into bed  but missed the bed and fell on the floor.  She hit her head on the side as she slipped down to the floor.  Her husband stated that he has been taking care of her for years helping her transfer from 1 position to the other and she usually participates in it but yesterday and today she could hardly do anything and she he himself is now very strong and he is not able to continue to take care of her at home and he is requesting for rehab.  He stated that they have had multiple home physical therapies on numerous occasions she will get better but then relapsed back into weakness so he wants her to be admitted for rehab.  I have consulted transition of care she might need SNF as patient's husband said he is no longer able to take care of her at home he does have some help with the daughter but the daughter is also sick with multiple sclerosis  3.  Recent fall head injury as a result of #2 patient denies dizziness or headache or loss of consciousness head CT, cervical spine CT, are all negative  4.  Recent urinary tract infection that have not completed the Keflex that was given to her at an urgent care in [facility].  UA she still shows some bacteria and haziness I will continue her Keflex  5.  Dementia patient has a little  bit of altered mental status but husband stated this is baseline that he waxes and wanes.  Examination today reveals patient is alert oriented x3 she knew she was in the hospital in [facility] and she did make a remark that the arthritis that is just the hospital she does not like when I pressed and ask her with what is the name of the hospital she told me [facility] .  We will continue Aricept  6.  Chronic respiratory failure patient is chronic home O2 at 3 L/min.  Patient's husband stated that if she does not have the oxygen her saturation goes down to the 80s.  7.  History of stroke.  Patient is on blood.  Fall and aspirin  8.  Hypertension slightly elevated continue metoprolol XL and amlodipine  9.  Type 2 diabetes mellitus continue sliding scale insulin and Lantus  10.  History of coronary artery disease.  Stable patient denied any chest pain EKG is normal troponin is negative.  Continue isosorbide with Imdur and atorvastatin. Hyperlipidemia: Continue atorvastatin  11.  COPD patient is not in exacerbation we will continue duo nebs as needed  12.  Legal blindness on the right eye.  Patient husband stated that to go every 3 months to get injection to the left eye to try and save the left eye  13.  Renal insufficiency l creatinine is 1.41, ikely AKI versus baseline patient does not seem to be dehydrated we will continue to monitor  14.  Elevated magnesium.  Patient is on magnesium 400 mg twice a day.  I will discontinue that for right now    Severity of Illness: The appropriate patient status for this patient is OBSERVATION. Observation status is judged to be reasonable and necessary in order to provide the required intensity of service to ensure the patient's safety. The patient's presenting symptoms, physical exam findings, and initial radiographic and laboratory data in the context of their medical condition is felt to place them at decreased risk for further clinical deterioration.  Furthermore, it is anticipated that the patient will be medically stable for discharge from the hospital within 2 midnights of admission. The following factors support the patient status of observation.   "  generalized weakness chronic debility recent fall head injury DVT prophylaxis: Plavix and aspirin.  TED hose    Code Status: Full  Family Communication: Husband at bedside  Disposition Plan: Possible rehab/SNF  Consults called: None PT OT and transition of care  Admission status: Observation     Cristal Deer MD [facility] Pager [phone]  If 7PM-7AM, please contact night-coverage [email or web address] Password [facility]  11/24/2020, 6:53 PM

## 2020-11-24 NOTE — ED Provider Notes (Signed)
[facility] EMERGENCY DEPARTMENT Provider Note   CSN: [id number] Arrival date & time: 11/24/20  1126     History Chief Complaint  Patient presents with   Weakness   Fall    [name] [name] is a 80 y.o. female history includes COPD, CAD, diabetes, hyperlipidemia, hypertension, CVA.  Patient presents to the ER today for generalized weakness and fall which occurred less than an hour prior to arrival.  Patient difficulty getting up off the ground with the help of her husband due to generalized weakness.  Patient reports no pain since her fall.  She is alert and oriented to self and place but seems somewhat confused to event.  Patient is on 2 L nasal cannula on initial evaluation with SPO2 at 100% which appears her baseline.  Level 5 caveat confusion.  I attempted multiple times to call family members for supplemental history but there was no answer. HPI     Past Medical History:  Diagnosis Date   Abnormal CT lung screening 10/17/2015   COPD (chronic obstructive pulmonary disease) (HCC)    Coronary artery disease, non-occlusive    a. cath 2006: min nonobs CAD; b. cath 12/2010: cath LAD 50%, RCA 60%; c. 08/2013: Minimal luminal irregs, right dominant system with no significant CAD, diffuse luminal irregs noted. Normal EF 55%, no AS or MS.    Diabetes mellitus    Hyperlipemia    Followed by Dr. Rosanna Randy   Hypertension    Lung cancer [facility])    Macular degeneration    rt   Personal history of tobacco use, presenting hazards to health 10/15/2015   Stroke [facility])     Patient Active Problem List   Diagnosis Date Noted   Generalized weakness 07/31/2020   COVID-19 virus detected 07/31/2020   Type 2 diabetes mellitus with stage 3 chronic kidney disease ([place]) 07/31/2020   History of anemia due to CKD 07/31/2020   Neutropenia ([place]) 09/09/2019   Drug-induced polyneuropathy ([place]) 09/09/2019   Inflammatory spondylopathy of lumbosacral region ([place]) 09/09/2019   COPD (chronic  obstructive pulmonary disease) ([place]) 06/06/2019   GI bleed 12/28/2018   Syncope 12/27/2018   Blood per rectum 12/27/2018   Constipation 12/27/2018   Chest pain 09/23/2018   Stroke (cerebrum) ([place]) 05/06/2018   Malnutrition of moderate degree 03/31/2017   Acute respiratory failure with hypoxia ([place]) 03/30/2017   AKI (acute kidney injury) ([place]) 07/15/2016   Protein-calorie malnutrition, severe 02/08/2016   Primary cancer of right upper lobe of lung ([place]) 11/20/2015   Abnormal CT lung screening 10/17/2015   Personal history of tobacco use, presenting hazards to health 10/15/2015   Chronic vulvitis 09/26/2014   Allergic reaction 09/26/2014   Carotid stenosis 08/30/2014   Cervical nerve root disorder 08/10/2014   CAD in native artery 08/10/2014   B12 deficiency 08/10/2014   Back ache 08/10/2014   Bronchitis, chronic ([place]) 08/10/2014   Diabetes mellitus with polyneuropathy ([place]) 08/10/2014   Can't get food down 08/10/2014   Eczema of external ear 08/10/2014   Accumulation of fluid in tissues 08/10/2014   Gout 08/10/2014   Adult hypothyroidism 08/10/2014   Mononeuritis 08/10/2014   Muscle ache 08/10/2014   Disorder of peripheral nervous system 08/10/2014   Lesion of vulva 08/10/2014   Cervical spondylosis with radiculopathy 10/19/2013   COPD exacerbation ([place]) 03/29/2013   CAD (coronary artery disease) 06/22/2011   COPD (chronic obstructive pulmonary disease) with emphysema ([place]) 03/07/2010   CHEST PAIN UNSPECIFIED 07/22/2009   HLD (hyperlipidemia) 04/01/2009  Malaise and fatigue 04/01/2009   TOBACCO ABUSE 01/18/2009   HYPERTENSION, BENIGN 01/18/2009   CLAUDICATION 01/18/2009   Pain in limb 01/18/2009   CAFL (chronic airflow limitation) ([place]) 01/20/2007   Late effects of cerebrovascular disease 01/10/2007   Essential (primary) hypertension 12/22/2006    Past Surgical History:  Procedure Laterality Date   ABDOMINAL HYSTERECTOMY     ANTERIOR CERVICAL DECOMP/DISCECTOMY  FUSION N/A 10/19/2013   Procedure: CERVICAL FIVE-SIX ANTERIOR CERVICAL DECOMPRESSION WITH FUSION INTERBODY PROSTHESIS PLATING AND PEEK CAGE;  Surgeon: Ophelia Charter, MD;  Location: [facility]URO ORS;  Service: Neurosurgery;  Laterality: N/A;   BACK SURGERY  80's   BREAST CYST EXCISION Left    left negative    CARDIAC CATHETERIZATION  05/2004   CATARACT EXTRACTION Left    CHOLECYSTECTOMY     COLONOSCOPY N/A 12/29/2018   Procedure: COLONOSCOPY;  Surgeon: Toledo, Benay [place], MD;  Location: [facility] ENDOSCOPY;  Service: Gastroenterology;  Laterality: N/A;   ENDARTERECTOMY Left 10/24/2014   Procedure: ENDARTERECTOMY CAROTID;  Surgeon: Algernon Huxley, MD;  Location: [facility] ORS;  Service: Vascular;  Laterality: Left;   ENDOBRONCHIAL ULTRASOUND N/A 11/14/2015   Procedure: ENDOBRONCHIAL ULTRASOUND;  Surgeon: [name], MD;  Location: [facility] ORS;  Service: Cardiopulmonary;  Laterality: N/A;   PERIPHERAL VASCULAR CATHETERIZATION N/A 12/04/2015   Procedure: Glori Luis Cath Insertion;  Surgeon: Algernon Huxley, MD;  Location: [facility] CV LAB;  Service: Cardiovascular;  Laterality: N/A;   PORTA CATH REMOVAL N/A 05/17/2017   Procedure: PORTA CATH REMOVAL;  Surgeon: Algernon Huxley, MD;  Location: [facility] CV LAB;  Service: Cardiovascular;  Laterality: N/A;   TONSILLECTOMY AND ADENOIDECTOMY     VESICOVAGINAL FISTULA CLOSURE W/ TAH       OB History     Gravida  4   Para  4   Term  3   Preterm  1   AB      Living  3      SAB      IAB      Ectopic      Multiple      Live Births  3           Family History  Problem Relation Age of Onset   Stroke Mother    Heart attack Father    Heart attack Brother    Heart attack Brother    Lung cancer Maternal Grandfather    Heart attack Paternal Grandmother        MI    Social History   Tobacco Use   Smoking status: Former    Packs/day: 1.00    Years: 50.00    Pack years: 50.00    Types: Cigarettes    Quit date: 10/03/2015    Years since quitting:  5.1   Smokeless tobacco: Never   Tobacco comments:    smokes 3 cigs daily 05/06/15. Pt instructed to quit.  Vaping Use   Vaping Use: Never used  Substance Use Topics   Alcohol use: No    Alcohol/week: 0.0 standard drinks   Drug use: No    Home Medications Prior to Admission medications   Medication Sig Start Date End Date Taking? Authorizing Provider  Accu-Chek FastClix Lancets MISC Check blood sugar up to 4 times daily. 06/20/20   Jerrol Banana., MD  albuterol (VENTOLIN HFA) 108 [phone] Base) MCG/ACT inhaler Inhale 2 puffs into the lungs every 4 (four) hours as needed for wheezing or shortness of breath. 09/26/18   Rosanna Randy,  Retia Passe., MD  amLODipine (NORVASC) 5 MG tablet TAKE 1 TABLET BY MOUTH EVERY DAY 09/10/20   Jerrol Banana., MD  aspirin EC 81 MG EC tablet Take 1 tablet (81 mg total) by mouth daily. 05/08/18   Mayo, Pete Pelt, MD  atorvastatin (LIPITOR) 40 MG tablet TAKE 1 TABLET BY MOUTH  DAILY AT 6 PM 04/18/20   Jerrol Banana., MD  Blood Glucose Monitoring Suppl (ACCU-CHEK GUIDE) w/Device KIT USE AS DIRECTED 08/16/19   Jerrol Banana., MD  cephALEXin (KEFLEX) 500 MG capsule Take 1 capsule (500 mg total) by mouth 2 (two) times daily for 7 days. 11/18/20 11/25/20  Blake Divine, MD  clopidogrel (PLAVIX) 75 MG tablet TAKE 1 TABLET BY MOUTH  DAILY 03/20/20   Jerrol Banana., MD  donepezil (ARICEPT) 5 MG tablet TAKE 1 TABLET BY MOUTH EVERYDAY AT BEDTIME 03/14/20   Jerrol Banana., MD  doxycycline (VIBRA-TABS) 100 MG tablet Take 1 tablet (100 mg total) by mouth 2 (two) times daily. Patient not taking: Reported on 10/15/2020 09/17/20   Jerrol Banana., MD  gabapentin (NEURONTIN) 600 MG tablet TAKE ONE-HALF TABLET BY  MOUTH TWICE DAILY 09/04/20   Jerrol Banana., MD  glucose blood (ACCU-CHEK GUIDE) test strip USE ONCE DAILY AS DIRECTED 11/03/20   Jerrol Banana., MD  ipratropium-albuterol (DUONEB) 0.5-2.5 (3) MG/3ML SOLN Take 3 mLs by  nebulization every 4 (four) hours as needed. 09/10/20   Jerrol Banana., MD  isosorbide mononitrate (IMDUR) 60 MG 24 hr tablet TAKE 1 TABLET BY MOUTH  DAILY 10/03/20   Jerrol Banana., MD  magnesium oxide (MAG-OX) 400 (241.3 MG) MG tablet Take 400 mg by mouth 2 (two) times daily.  08/04/13   [provider]  metoprolol succinate (TOPROL-XL) 100 MG 24 hr tablet Take 1 tablet (100 mg total) by mouth daily. 08/16/19   Jerrol Banana., MD  mometasone (ELOCON) 0.1 % lotion Apply 4 drops topically at bedtime as needed (dry skin). Patient not taking: No sig reported 07/07/19   Jerrol Banana., MD  Multiple Vitamin (MULTIVITAMIN) tablet Take 0.5 tablets by mouth 2 (two) times daily.    [provider]  Multiple Vitamins-Minerals (ICAPS) CAPS Take 1 capsule by mouth 2 (two) times daily.     [provider]  nitroGLYCERIN (NITROSTAT) 0.4 MG SL tablet PLACE 1 TABLET UNDER THE TONGUE EVERY 5 MINUTES AS NEEDED FOR CHEST PAIN 01/02/20   Jerrol Banana., MD  pantoprazole (PROTONIX) 40 MG tablet TAKE 1 TABLET BY MOUTH TWICE A DAY 09/10/20   Jerrol Banana., MD  predniSONE (DELTASONE) 20 MG tablet Take 1 tablet (20 mg total) by mouth daily with breakfast. Patient not taking: Reported on 10/15/2020 09/17/20   Jerrol Banana., MD  protective barrier (RESTORE) CREA Apple twice daily. 07/12/20   Trinna Post, PA-C  VITAMIN D PO Take by mouth daily.     [provider]  vitamin E 180 MG (400 UNITS) capsule Take 400 Units by mouth daily.    [provider]  Wound Dressings (COMFEEL PLUS ULCER DRESSING) PADS Apply once daily as needed. 07/12/20   Trinna Post, PA-C  zinc sulfate 220 (50 Zn) MG capsule Take 220 mg by mouth daily.    [provider]    Allergies    Coconut fatty acids  Review of Systems   Review of Systems  Unable to perform ROS: Mental status change   Physical Exam Updated Vital Signs BP (!) 146/56 (BP  Location: Right Arm)   Pulse 72   Temp 98.3 F (36.8 C) (Oral)   Resp 15   SpO2 100%   Physical Exam Constitutional:      General: She is not in acute distress.    Appearance: Normal appearance. She is well-developed. She is not ill-appearing or diaphoretic.  HENT:     Head: Normocephalic. Abrasion present.     Jaw: There is normal jaw occlusion.     Comments: Small abrasion left eyelid without active bleeding. Eyes:     General: Vision grossly intact. Gaze aligned appropriately.     Pupils: Pupils are equal, round, and reactive to light.  Neck:     Trachea: Trachea and phonation normal.  Pulmonary:     Effort: Pulmonary effort is normal. No respiratory distress.  Abdominal:     General: There is no distension.     Palpations: Abdomen is soft.     Tenderness: There is no abdominal tenderness. There is no guarding or rebound.  Musculoskeletal:        General: Normal range of motion.     Cervical back: Normal range of motion.     Comments: Patient is able to grab bilateral rails with her arms, pull herself to a seated position with minimal assistance.  No midline spinal tenderness to palpation.  No crepitus step-off or deformity of the spine.  No TTP of the back.  No TTP of the major joints of the upper or lower extremity she moves all extremities spontaneously.  Skin:    General: Skin is warm and dry.  Neurological:     Mental Status: She is alert.     GCS: GCS eye subscore is 4. GCS verbal subscore is 5. GCS motor subscore is 6.     Comments: Speech is clear and goal oriented, follows commands Major Cranial nerves without deficit, no facial droop Moves extremities without ataxia, coordination intact  Psychiatric:        Behavior: Behavior normal.    ED Results / Procedures / Treatments   Labs (all labs ordered are listed, but only abnormal results are displayed) Labs Reviewed - No data to display  EKG None  Radiology No results found.  Procedures Procedures    Medications Ordered in ED Medications - No data to display  ED Course  I have reviewed the triage vital signs and the nursing notes.  Pertinent labs & imaging results that were available during my care of the patient were reviewed by me and considered in my medical decision making (see chart for details).  Clinical Course as of 11/24/20 4782  Nancy Fetter Nov 24, 2020  1146 Crisanti,[name] (Spouse)  [phone] Straight to VM. [BM]  1431 Chapple,[name] (Spouse)  [phone] [facility]) Straight to VM [BM]  1431 [phone] (Home Phone)  No answer [BM]    Clinical Course User Index [BM] Gari Crown   MDM Rules/Calculators/A&P                           Additional history obtained from: Nursing notes from this visit. Review of electronic medical records.  Patient seen and the ER on 11/18/2020 diagnosis weakness and acute cystitis without hematuria.  She was discharged on Keflex 500 mg twice daily. ------------ 80 year old female presented from home after a fall today she has generalized weakness as her only complaint  no pain.  I attempted multiple times to obtain supplemental history from patient's family but there was no answer.  Patient moving all 4 extremities spontaneously.  Only sign of injury is a small abrasion to the left eyelid which is not bleeding and there is no indication for repair.  I ordered CT head, max face and cervical spine along with x-ray of the chest and hip/pelvis.  Basic labs were ordered as well for evaluation of generalized weakness. ----- CT Head/MaxFace/Cspine:  IMPRESSION:  No acute intracranial abnormality.  No acute facial fracture.     No acute cervical spine fracture. Prior ACDF at C5-C6 without  evidence of hardware complication.   DG Pelvis w/ left hip: IMPRESSION:  1. No fracture or dislocation.   DG Chest:   IMPRESSION:  1. No acute cardiopulmonary disease.  2. Chronic bronchitic changes most evident in the right lung base.   I ordered,  reviewed and interpreted labs which include: COVID/influenza panel negative. Lactic within normal limits. Magnesium slightly elevated 2.8. CMP shows no emergent electrolyte derangement, LFT elevations or gap.  Patient does have worsening creatinine compared to prior visits. High-sensitivity troponin within normal limits. CK within normal limits, no evidence for rhabdomyolysis Urinalysis without evidence of infection ------ Patient reassessed she is resting calmly bed no acute distress alert, she denies any pain or concerns at this time.  Patient's CBC was lost and so phlebotomy is redrawing at this time.  --  Patient seen and evaluated by attending physician Dr. Roderic Palau, patient reported now that she syncopized and woke up on the floor earlier today, plan of care is to await CBC and then admit to medicine for syncope work-up.  Care handoff given to [facility], PA-C at shift change.  Plan of care as above.  Note: Portions of this report may have been transcribed using voice recognition software. Every effort was made to ensure accuracy; however, inadvertent computerized transcription errors may still be present.   Final Clinical Impression(s) / ED Diagnoses Final diagnoses:  None    Rx / DC Orders ED Discharge Orders     None        Gari Crown 11/24/20 1610    Milton Ferguson, MD 11/25/20 1520

## 2020-11-24 NOTE — ED Notes (Signed)
Called 5N, told to call back in 5-6 minutes due to RN in patient care.

## 2020-11-24 NOTE — ED Notes (Signed)
Pt transported to Xray. 

## 2020-11-24 NOTE — ED Triage Notes (Addendum)
Pt BIB [facility] from home c/o weakness and a fall. Pt was on the floor for 30 mins prior to ems getting there. Pt's husband was unable to get her up due to weakness. Pt is currently only alert to self and place. Pt wears 2L [place] at home continuously. Pt denies any pain at this time.

## 2020-11-25 ENCOUNTER — Inpatient Hospital Stay: Payer: Medicare Other | Admitting: Family Medicine

## 2020-11-25 DIAGNOSIS — E785 Hyperlipidemia, unspecified: Secondary | ICD-10-CM | POA: Diagnosis present

## 2020-11-25 DIAGNOSIS — I951 Orthostatic hypotension: Secondary | ICD-10-CM | POA: Diagnosis present

## 2020-11-25 DIAGNOSIS — M109 Gout, unspecified: Secondary | ICD-10-CM | POA: Diagnosis present

## 2020-11-25 DIAGNOSIS — H548 Legal blindness, as defined in USA: Secondary | ICD-10-CM | POA: Diagnosis present

## 2020-11-25 DIAGNOSIS — B9620 Unspecified Escherichia coli [E. coli] as the cause of diseases classified elsewhere: Secondary | ICD-10-CM | POA: Diagnosis present

## 2020-11-25 DIAGNOSIS — I6522 Occlusion and stenosis of left carotid artery: Secondary | ICD-10-CM | POA: Diagnosis present

## 2020-11-25 DIAGNOSIS — Z981 Arthrodesis status: Secondary | ICD-10-CM | POA: Diagnosis not present

## 2020-11-25 DIAGNOSIS — I2510 Atherosclerotic heart disease of native coronary artery without angina pectoris: Secondary | ICD-10-CM | POA: Diagnosis present

## 2020-11-25 DIAGNOSIS — D631 Anemia in chronic kidney disease: Secondary | ICD-10-CM | POA: Diagnosis present

## 2020-11-25 DIAGNOSIS — Z20822 Contact with and (suspected) exposure to covid-19: Secondary | ICD-10-CM | POA: Diagnosis present

## 2020-11-25 DIAGNOSIS — I129 Hypertensive chronic kidney disease with stage 1 through stage 4 chronic kidney disease, or unspecified chronic kidney disease: Secondary | ICD-10-CM | POA: Diagnosis present

## 2020-11-25 DIAGNOSIS — Z993 Dependence on wheelchair: Secondary | ICD-10-CM | POA: Diagnosis not present

## 2020-11-25 DIAGNOSIS — I252 Old myocardial infarction: Secondary | ICD-10-CM | POA: Diagnosis not present

## 2020-11-25 DIAGNOSIS — R29898 Other symptoms and signs involving the musculoskeletal system: Secondary | ICD-10-CM | POA: Diagnosis not present

## 2020-11-25 DIAGNOSIS — I493 Ventricular premature depolarization: Secondary | ICD-10-CM | POA: Diagnosis present

## 2020-11-25 DIAGNOSIS — E039 Hypothyroidism, unspecified: Secondary | ICD-10-CM | POA: Diagnosis present

## 2020-11-25 DIAGNOSIS — R55 Syncope and collapse: Secondary | ICD-10-CM | POA: Diagnosis present

## 2020-11-25 DIAGNOSIS — J9610 Chronic respiratory failure, unspecified whether with hypoxia or hypercapnia: Secondary | ICD-10-CM | POA: Diagnosis present

## 2020-11-25 DIAGNOSIS — F0280 Dementia in other diseases classified elsewhere without behavioral disturbance: Secondary | ICD-10-CM | POA: Diagnosis present

## 2020-11-25 DIAGNOSIS — N1831 Chronic kidney disease, stage 3a: Secondary | ICD-10-CM | POA: Diagnosis present

## 2020-11-25 DIAGNOSIS — N390 Urinary tract infection, site not specified: Secondary | ICD-10-CM | POA: Diagnosis present

## 2020-11-25 DIAGNOSIS — E1142 Type 2 diabetes mellitus with diabetic polyneuropathy: Secondary | ICD-10-CM | POA: Diagnosis present

## 2020-11-25 DIAGNOSIS — E1122 Type 2 diabetes mellitus with diabetic chronic kidney disease: Secondary | ICD-10-CM | POA: Diagnosis present

## 2020-11-25 DIAGNOSIS — J439 Emphysema, unspecified: Secondary | ICD-10-CM | POA: Diagnosis present

## 2020-11-25 DIAGNOSIS — Z9981 Dependence on supplemental oxygen: Secondary | ICD-10-CM | POA: Diagnosis not present

## 2020-11-25 DIAGNOSIS — H3530 Unspecified macular degeneration: Secondary | ICD-10-CM | POA: Diagnosis present

## 2020-11-25 LAB — BASIC METABOLIC PANEL
Anion gap: 14 (ref 5–15)
BUN: 21 mg/dL (ref 8–23)
CO2: 28 mmol/L (ref 22–32)
Calcium: 8.8 mg/dL — ABNORMAL LOW (ref 8.9–10.3)
Chloride: 99 mmol/L (ref 98–111)
Creatinine, Ser: 1.13 mg/dL — ABNORMAL HIGH (ref 0.44–1.00)
GFR, Estimated: 50 mL/min — ABNORMAL LOW (ref >60)
Glucose, Bld: 79 mg/dL (ref 70–99)
Potassium: 4.6 mmol/L (ref 3.5–5.1)
Sodium: 141 mmol/L (ref 135–145)

## 2020-11-25 LAB — GLUCOSE, CAPILLARY
Glucose-Capillary: 123 mg/dL — ABNORMAL HIGH (ref 70–99)
Glucose-Capillary: 64 mg/dL — ABNORMAL LOW (ref 70–99)
Glucose-Capillary: 168 mg/dL — ABNORMAL HIGH (ref 70–99)
Glucose-Capillary: 140 mg/dL — ABNORMAL HIGH (ref 70–99)
Glucose-Capillary: 84 mg/dL (ref 70–99)
Glucose-Capillary: 128 mg/dL — ABNORMAL HIGH (ref 70–99)

## 2020-11-25 MED ORDER — SODIUM CHLORIDE 0.9 % IV SOLN: 250.0000 mL | INTRAVENOUS | Status: DC | PRN

## 2020-11-25 MED ORDER — SODIUM CHLORIDE 0.9 % IV SOLN
250.0000 mL | INTRAVENOUS | Status: DC | PRN
  Administered 2020-11-25: 250 mL via INTRAVENOUS

## 2020-11-25 MED ORDER — METOPROLOL SUCCINATE ER 50 MG PO TB24
50.0000 mg | ORAL_TABLET | Freq: Every day | ORAL | Status: DC
  Administered 2020-11-26 – 2020-11-27 (×2): 50 mg via ORAL
  Filled 2020-11-25 (×2): qty 1

## 2020-11-25 MED ORDER — GABAPENTIN 100 MG PO CAPS
100.0000 mg | ORAL_CAPSULE | Freq: Two times a day (BID) | ORAL | Status: DC
  Administered 2020-11-25 – 2020-11-27 (×5): 100 mg via ORAL
  Filled 2020-11-25 (×5): qty 1

## 2020-11-25 NOTE — Hospital Course (Addendum)
CSW received consult for possible SNF placement at time of discharge. CSW spoke with patient and patient's spouse. Patient's spouse reported being unable to care for patient at their home given patient's current physical needs and fall risk. Patient expressed understanding of PT recommendation and is agreeable to SNF placement at time of discharge. Patient reports preference for a facility in [place]. CSW discussed insurance authorization process and will provide Medicare SNF ratings list. Patient has received  COVID vaccines.  No further questions reported at this time.

## 2020-11-25 NOTE — NC FL2 (Signed)
[place] MEDICAID FL2 LEVEL OF CARE SCREENING TOOL     IDENTIFICATION  Patient Name: [name] [name] Birthdate: 1940/05/03 Sex: female Admission Date (Current Location): 11/24/2020  [facility] and [place] Number:  Herbalist and Address:  [facility] [place], [place] [address] [place], [place] [address]      Provider Number: [id number]  Attending Physician Name and Address:  Nita Sells, MD  Relative Name and Phone Number:  Doepke,[name] (Spouse)   [phone]    Current Level of Care: Hospital Recommended Level of Care: [place] Prior Approval Number:    Date Approved/Denied:   PASRR Number: [id number]  Discharge Plan: SNF    Current Diagnoses: Patient Active Problem List   Diagnosis Date Noted   Syncope and collapse 11/25/2020   Weakness of both legs 11/24/2020   Fall at home, initial encounter 11/24/2020   Generalized weakness 07/31/2020   COVID-19 virus detected 07/31/2020   Type 2 diabetes mellitus with stage 3 chronic kidney disease ([place]) 07/31/2020   History of anemia due to CKD 07/31/2020   Neutropenia ([place]) 09/09/2019   Drug-induced polyneuropathy ([place]) 09/09/2019   Inflammatory spondylopathy of lumbosacral region ([place]) 09/09/2019   COPD (chronic obstructive pulmonary disease) ([place]) 06/06/2019   GI bleed 12/28/2018   Syncope 12/27/2018   Blood per rectum 12/27/2018   Constipation 12/27/2018   Chest pain 09/23/2018   Stroke (cerebrum) ([place]) 05/06/2018   Malnutrition of moderate degree 03/31/2017   Acute respiratory failure with hypoxia ([place]) 03/30/2017   AKI (acute kidney injury) ([place]) 07/15/2016   Protein-calorie malnutrition, severe 02/08/2016   Primary cancer of right upper lobe of lung ([place]) 11/20/2015   Abnormal CT lung screening 10/17/2015   Personal history of tobacco use, presenting hazards to health 10/15/2015   Chronic vulvitis 09/26/2014   Allergic reaction 09/26/2014   Carotid stenosis  08/30/2014   Cervical nerve root disorder 08/10/2014   CAD in native artery 08/10/2014   B12 deficiency 08/10/2014   Back ache 08/10/2014   Bronchitis, chronic ([place]) 08/10/2014   Diabetes mellitus with polyneuropathy ([place]) 08/10/2014   Can't get food down 08/10/2014   Eczema of external ear 08/10/2014   Accumulation of fluid in tissues 08/10/2014   Gout 08/10/2014   Adult hypothyroidism 08/10/2014   Mononeuritis 08/10/2014   Muscle ache 08/10/2014   Disorder of peripheral nervous system 08/10/2014   Lesion of vulva 08/10/2014   Cervical spondylosis with radiculopathy 10/19/2013   COPD exacerbation ([place]) 03/29/2013   CAD (coronary artery disease) 06/22/2011   COPD (chronic obstructive pulmonary disease) with emphysema ([place]) 03/07/2010   CHEST PAIN UNSPECIFIED 07/22/2009   HLD (hyperlipidemia) 04/01/2009   Malaise and fatigue 04/01/2009   TOBACCO ABUSE 01/18/2009   HYPERTENSION, BENIGN 01/18/2009   CLAUDICATION 01/18/2009   Pain in limb 01/18/2009   CAFL (chronic airflow limitation) ([place]) 01/20/2007   Late effects of cerebrovascular disease 01/10/2007   Essential (primary) hypertension 12/22/2006    Orientation RESPIRATION BLADDER Height & Weight     Situation, Place, Self  Normal Incontinent Weight: 149 lb 7.6 oz (67.8 kg) Height:     BEHAVIORAL SYMPTOMS/MOOD NEUROLOGICAL BOWEL NUTRITION STATUS      Continent Diet (see d/c summary)  AMBULATORY STATUS COMMUNICATION OF NEEDS Skin   Extensive Assist Verbally Normal                       Personal Care Assistance Level of Assistance  Bathing, Dressing, Feeding Bathing Assistance: Limited assistance  Feeding assistance: Independent Dressing Assistance: Limited assistance     Functional Limitations Info  Sight, Hearing, Speech Sight Info: Adequate Hearing Info: Adequate Speech Info: Adequate    SPECIAL CARE FACTORS FREQUENCY  PT (By licensed PT), OT (By licensed OT)     PT Frequency: 5x/ week OT Frequency: 5x/  week            Contractures Contractures Info: Not present    Additional Factors Info  Code Status, Allergies, Insulin Sliding Scale Code Status Info: Full Allergies Info: Coconut Fatty Acids   Insulin Sliding Scale Info: see d/c med list       Current Medications (11/25/2020):  This is the current hospital active medication list Current Facility-Administered Medications  Medication Dose Route Frequency Provider Last Rate Last Admin   0.9 %  sodium chloride infusion  250 mL Intravenous PRN Nita Sells, MD 125 mL/hr at 11/25/20 1359 250 mL at 11/25/20 1359   amLODipine (NORVASC) tablet 5 mg  5 mg Oral Daily Cristal Deer, MD   5 mg at 11/25/20 0957   aspirin EC tablet 81 mg  81 mg Oral Daily Cristal Deer, MD   81 mg at 11/25/20 0957   atorvastatin (LIPITOR) tablet 40 mg  40 mg Oral QHS Cristal Deer, MD   40 mg at 11/24/20 2216   cephALEXin (KEFLEX) capsule 500 mg  500 mg Oral BID Cristal Deer, MD   500 mg at 11/25/20 2035   cholecalciferol (VITAMIN D3) tablet 1,000 Units  1,000 Units Oral Daily Cristal Deer, MD   1,000 Units at 11/25/20 5974   clopidogrel (PLAVIX) tablet 75 mg  75 mg Oral Daily Cristal Deer, MD   75 mg at 11/25/20 0957   donepezil (ARICEPT) tablet 5 mg  5 mg Oral QHS Cristal Deer, MD   5 mg at 11/24/20 2217   gabapentin (NEURONTIN) capsule 100 mg  100 mg Oral BID Nita Sells, MD   100 mg at 11/25/20 0957   insulin aspart (novoLOG) injection 0-15 Units  0-15 Units Subcutaneous TID WC Cristal Deer, MD   2 Units at 11/25/20 1146   ipratropium-albuterol (DUONEB) 0.5-2.5 (3) MG/3ML nebulizer solution 3 mL  3 mL Nebulization Q4H PRN Cristal Deer, MD       Derrill Memo ON 11/26/2020] metoprolol succinate (TOPROL-XL) 24 hr tablet 50 mg  50 mg Oral Daily Nita Sells, MD       multivitamin with minerals tablet 0.5 tablet  0.5 tablet Oral BID Cristal Deer, MD   0.5 tablet at 11/25/20 0956   pantoprazole (PROTONIX) EC  tablet 40 mg  40 mg Oral BID Cristal Deer, MD   40 mg at 11/25/20 0957   sodium chloride flush (NS) 0.9 % injection 3 mL  3 mL Intravenous Q12H Cristal Deer, MD   3 mL at 11/25/20 1006   sodium chloride flush (NS) 0.9 % injection 3 mL  3 mL Intravenous PRN Cristal Deer, MD       vitamin E capsule 400 Units  400 Units Oral Daily Cristal Deer, MD   400 Units at 11/25/20 0957   zinc sulfate capsule 220 mg  220 mg Oral Daily Cristal Deer, MD   220 mg at 11/25/20 1638   Facility-Administered Medications Ordered in Other Encounters  Medication Dose Route Frequency Provider Last Rate Last Admin   ondansetron (ZOFRAN) 8 mg, dexamethasone (DECADRON) 10 mg in sodium chloride 0.9 % 50 mL IVPB   Intravenous Once Lloyd Huger, MD  Discharge Medications: Please see discharge summary for a list of discharge medications.  Relevant Imaging Results:  Relevant Lab Results:   Additional Information SSN; [id number];  [place] COVID-19 Vaccine 04/10/2020 , 06/20/2019 , 05/29/2019  Milinda Antis, LCSWA

## 2020-11-25 NOTE — Progress Notes (Signed)
Tele box [id number] placed on patient per orders.

## 2020-11-25 NOTE — TOC Initial Note (Signed)
Transition of Care [facility]) - Initial/Assessment Note    Patient Details  Name: [name] [name] MRN: [id number] Date of Birth: July 29, 1940  Transition of Care [facility]) CM/SW Contact:    Milinda Antis, [place] Phone Number: 11/25/2020, 4:24 PM  Clinical Narrative:                 CSW received consult for possible SNF placement at time of discharge. CSW spoke with patient and patient's spouse. Patient's spouse reported being unable to care for patient at their home given patient's current physical needs and fall risk. Patient expressed understanding of PT recommendation and is agreeable to SNF placement at time of discharge. Patient reports preference for a facility in [place]. CSW discussed insurance authorization process and will provide Medicare SNF ratings list. Patient has received  COVID vaccines.  No further questions reported at this time.   Skilled Nursing Rehab Facilities-   [email or web address] Ratings out of 5 possible    Name Address  Phone # [place] Inspection Overall  [name] [address], [place] 5 1 4 4   [facility]  [address] [place], [place] [phone] 3 1 5 4   [facility] [place], [place] 3 1 1 1   [facility] [place], [place] 2 2 4 4   [facility] [address], [place] 3 1 2 1   [place] [facility] 3 2 4 4   [facility] [address] [place] 5 1 2 2   [facility] [address] [place] [phone] 5 2 2 3   [facility] [place] 5 1 2 2   [facility] [phone], [place] [phone] 5 1 2 2   [facility] [address], [facility] [phone] 5 1 2 2   [place] [place] [phone] 3 1 1 1      Expected  Discharge Plan: Skilled Nursing Facility Barriers to Discharge: SNF Pending bed offer, Insurance Authorization   Patient Goals and CMS Choice Patient states their goals for this hospitalization and ongoing recovery are:: To get back home CMS Medicare.gov Compare Post Acute Care list provided to:: Patient Choice offered to / list presented to : Patient, Spouse  Expected Discharge Plan and Services Expected Discharge Plan: [place]                                              Prior Living Arrangements/Services   Lives with:: Spouse Patient language and need for interpreter reviewed:: Yes        Need for Family Participation in Patient Care: Yes (Comment) Care giver support system in place?: Yes (comment)   Criminal Activity/Legal Involvement Pertinent to Current Situation/Hospitalization: No - Comment as needed  Activities of Daily Living      Permission Sought/Granted   Permission granted to share information with : Yes, Verbal Permission Granted     Permission granted to share info w AGENCY: SNF        Emotional Assessment Appearance:: Appears stated age Attitude/Demeanor/Rapport: Apprehensive, Engaged Affect (typically observed): Accepting Orientation: : Oriented to Self, Oriented to Place, Oriented to Situation Alcohol / Substance Use: Not Applicable Psych Involvement: No (comment)  Admission diagnosis:  Fall [W19.XXXA] Generalized weakness [R53.1] Syncope and collapse [  R55] Patient Active Problem List   Diagnosis Date Noted   Syncope and collapse 11/25/2020   Weakness of both legs 11/24/2020   Fall at home, initial encounter 11/24/2020   Generalized weakness 07/31/2020   COVID-19 virus detected 07/31/2020   Type 2 diabetes mellitus with stage 3 chronic kidney disease ([place]) 07/31/2020   History of anemia due to CKD 07/31/2020   Neutropenia ([place]) 09/09/2019   Drug-induced polyneuropathy ([place]) 09/09/2019   Inflammatory  spondylopathy of lumbosacral region ([place]) 09/09/2019   COPD (chronic obstructive pulmonary disease) ([place]) 06/06/2019   GI bleed 12/28/2018   Syncope 12/27/2018   Blood per rectum 12/27/2018   Constipation 12/27/2018   Chest pain 09/23/2018   Stroke (cerebrum) ([place]) 05/06/2018   Malnutrition of moderate degree 03/31/2017   Acute respiratory failure with hypoxia ([place]) 03/30/2017   AKI (acute kidney injury) ([place]) 07/15/2016   Protein-calorie malnutrition, severe 02/08/2016   Primary cancer of right upper lobe of lung ([place]) 11/20/2015   Abnormal CT lung screening 10/17/2015   Personal history of tobacco use, presenting hazards to health 10/15/2015   Chronic vulvitis 09/26/2014   Allergic reaction 09/26/2014   Carotid stenosis 08/30/2014   Cervical nerve root disorder 08/10/2014   CAD in native artery 08/10/2014   B12 deficiency 08/10/2014   Back ache 08/10/2014   Bronchitis, chronic ([place]) 08/10/2014   Diabetes mellitus with polyneuropathy ([place]) 08/10/2014   Can't get food down 08/10/2014   Eczema of external ear 08/10/2014   Accumulation of fluid in tissues 08/10/2014   Gout 08/10/2014   Adult hypothyroidism 08/10/2014   Mononeuritis 08/10/2014   Muscle ache 08/10/2014   Disorder of peripheral nervous system 08/10/2014   Lesion of vulva 08/10/2014   Cervical spondylosis with radiculopathy 10/19/2013   COPD exacerbation ([place]) 03/29/2013   CAD (coronary artery disease) 06/22/2011   COPD (chronic obstructive pulmonary disease) with emphysema ([place]) 03/07/2010   CHEST PAIN UNSPECIFIED 07/22/2009   HLD (hyperlipidemia) 04/01/2009   Malaise and fatigue 04/01/2009   TOBACCO ABUSE 01/18/2009   HYPERTENSION, BENIGN 01/18/2009   CLAUDICATION 01/18/2009   Pain in limb 01/18/2009   CAFL (chronic airflow limitation) ([place]) 01/20/2007   Late effects of cerebrovascular disease 01/10/2007   Essential (primary) hypertension 12/22/2006   PCP:  Jerrol Banana., MD Pharmacy:    [id number] [place], [place] [place] [address] Phone: [phone] Fax: [phone]  [facility]  ([place], [place] [address] Phone: [phone] Fax: [phone]     Social Determinants of Health (SDOH) Interventions    Readmission Risk Interventions No flowsheet data found.

## 2020-11-25 NOTE — Evaluation (Signed)
Physical Therapy Evaluation Patient Details Name: [name] [name] MRN: [id number] DOB: 02-10-1941 Today's Date: 11/25/2020   History of Present Illness  [name] [name] is a 80 y.o. female who presented to the emergency department with progressive generalized weakness over the past week with a fall.  Patient has chronic debility and has been wheelchair-bound for years, but able to stand to assist with changing positions with the help of her husband. medical history significant for COPD with chronic respiratory failure on 3 L/min oxygen at home, lung cancer 4 years ago in remission,Coronary artery disease, hypertension, diabetes mellitus, hyperlipidemia, CVA, dementia, & chronic debility.  Clinical Impression   Patient received in bed, pleasant and asking to be repositioned in bed. Unsure if she was aware she had urinated in the bed-  did not ask for help getting cleaned up until this was pointed out to her. Able to perform functional transfers with Min-ModA and RW, VSS on 3LPM O2. Would benefit from chair follow for safety progressing gait. Left up in recliner with alarm active, all needs otherwise met and NT aware of pt status. Recommend SNF moving forward.     Follow Up Recommendations SNF;Supervision/Assistance - 24 hour    Equipment Recommendations  Rolling walker with 5" wheels;3in1 (PT);Wheelchair (measurements PT);Wheelchair cushion (measurements PT)    Recommendations for Other Services       Precautions / Restrictions Precautions Precautions: Fall Precaution Comments: 3L O2 att times Restrictions Weight Bearing Restrictions: No      Mobility  Bed Mobility Overal bed mobility: Needs Assistance Bed Mobility: Supine to Sit     Supine to sit: Min assist;HOB elevated     General bed mobility comments: min A to elevate trunk, increased time and effort    Transfers Overall transfer level: Needs assistance Equipment used: Rolling walker (2 wheeled) Transfers: Sit to/from  [facility] Sit to Stand: Mod assist Stand pivot transfers: Min assist      General transfer comment: sit to stands varied from Min-ModA based on cue following- when she followed cues, able to perform with MinA, when she did not follow cues for hand placement/pulled on RW she needed [place]. MinA to pivot over to recliner with RW however.  Ambulation/Gait             General Gait Details: deferred- will need chair follow for safety  Stairs            Wheelchair Mobility    Modified Rankin (Stroke Patients Only)       Balance Overall balance assessment: Needs assistance Sitting-balance support: Feet supported Sitting balance-Leahy Scale: Fair     Standing balance support: Bilateral upper extremity supported Standing balance-Leahy Scale: Poor                               Pertinent Vitals/Pain Pain Assessment: Faces Pain Score: 0-No pain Faces Pain Scale: No hurt Pain Intervention(s): Limited activity within patient's tolerance;Monitored during session    [place] expects to be discharged to:: Private residence Living Arrangements: Spouse/significant other Available Help at Discharge: Family;Available 24 hours/day Type of Home: Mobile home Home Access: Stairs to enter Entrance Stairs-Rails: None Entrance Stairs-Number of Steps: 6 Home Layout: One level Home Equipment: Walker - 4 wheels;Bedside commode;Shower seat;Wheelchair - manual;Hospital bed      Prior Function Level of Independence: Needs assistance   Gait / Transfers Assistance Needed: WC bound, total A with wc mobility. Husband assists  wtuh stand pivot trasnfers. Grandson carries her up/down the steps  ADL's / Homemaking Assistance Needed: Assistance with ADLs, pt reports husband hels her transfer in/out of shower & on/off toilet. Assist with bathing and dressing.  Comments: 3L home O2 at baseline     Hand Dominance   Dominant Hand: Right     Extremity/Trunk Assessment   Upper Extremity Assessment Upper Extremity Assessment: Defer to OT evaluation    Lower Extremity Assessment Lower Extremity Assessment: Generalized weakness    Cervical / Trunk Assessment Cervical / Trunk Assessment: Kyphotic  Communication   Communication: No difficulties  Cognition Arousal/Alertness: Awake/alert Behavior During Therapy: WFL for tasks assessed/performed Overall Cognitive Status: No family/caregiver present to determine baseline cognitive functioning                                 General Comments: Pt with slow processing, requries simple one step commands and incrased time for cue following; hx of dementia      General Comments General comments (skin integrity, edema, etc.): VSS on 3LPM O2    Exercises     Assessment/Plan    PT Assessment Patient needs continued PT services  PT Problem List Decreased strength;Decreased cognition;Decreased knowledge of use of DME;Decreased activity tolerance;Decreased safety awareness;Decreased balance;Decreased mobility       PT Treatment Interventions DME instruction;Balance training;Gait training;Cognitive remediation;Functional mobility training;Patient/family education;Therapeutic activities;Therapeutic exercise;Wheelchair mobility training    PT Goals (Current goals can be found in the Care Plan section)  Acute Rehab PT Goals Patient Stated Goal: home soon PT Goal Formulation: With patient Time For Goal Achievement: 12/09/20 Potential to Achieve Goals: Fair    Frequency Min 2X/week   Barriers to discharge        Co-evaluation               AM-PAC PT "6 Clicks" Mobility  Outcome Measure Help needed turning from your back to your side while in a flat bed without using bedrails?: A Little Help needed moving from lying on your back to sitting on the side of a flat bed without using bedrails?: A Little Help needed moving to and from a bed to a chair (including  a wheelchair)?: A Lot Help needed standing up from a chair using your arms (e.g., wheelchair or bedside chair)?: A Lot Help needed to walk in hospital room?: A Lot Help needed climbing 3-5 steps with a railing? : Total 6 Click Score: 13    End of Session Equipment Utilized During Treatment: Gait belt;Oxygen Activity Tolerance: Patient tolerated treatment well Patient left: in chair;with call bell/phone within reach;with chair alarm set Nurse Communication: Mobility status PT Visit Diagnosis: Unsteadiness on feet (R26.81);Muscle weakness (generalized) (M62.81);History of falling (Z91.81)    Time: 7915-0569 PT Time Calculation (min) (ACUTE ONLY): 14 min   Charges:   PT Evaluation $PT Eval Moderate Complexity: 1 Mod         Jetaime Pinnix U PT, DPT, PN2   Supplemental Physical Therapist [place]    Pager [phone] Acute Rehab Office [phone]

## 2020-11-25 NOTE — Progress Notes (Addendum)
PROGRESS NOTE   [name] [name]  ZOX:[id number] DOB: 1940-06-10 DOA: 11/24/2020 PCP: Jerrol Banana., MD  Brief Narrative:   80 year old white female from home COPD/3 L oxygen + lung cancer 4 years ago in remission status post surgery Multi-infarct with dementia 2/2 CVA CAD with NSTEMI 01/29/2015, DM TY 2 CKD stage III AA, HLD, chronic left-sided carotid stenosis >70% referred to Dr. Lucky Cowboy [facility] for carotid stenosis COVID-19 infection 08/02/2020  Seen [facility] ED 11/18/2020-several days bilateral leg weakness inability to walk-thought to have UTI given dose of Rocephin Rx Keflex and discharged  Came to [facility] ED 8/21 weakness fall found down on floor 30 minutes alert to self --placed on 2 L oxygen  Admitted for syncope work-up to [facility]  Hospital-Problem based course  Syncope + fall Etiology likely secondary to continued orthostasis--continue saline 125 cc/H at this time-does not appear to be drinking sufficient amount of liquids to meet physiological needs TED hose to be placed Stop further work-up at this time-adjust metoprolol downwards to 50 XL continue amlodipine at current dose--recheck orthostatics every morning PT recommends skilled care at rehab facility with 5 inch we will rolling walker + 3 in 1 COPD on 3 L baseline oxygen---has been on this before Covid Continue DuoNeb every 4 as needed no active signs symptoms exacerbation Prior NSTEMI with CAD Continue metoprolol 50 instead of 100--- continue to adjust meds Continue Plavix 75 daily aspirin 81 daily-clarification needed by cardiology in the outpatient setting regarding dosages of these Hold isosorbide for now may need to add back if less orthostatic Chronic left-sided carotid stenosis past Outpatient reeval COVID recovered 2022 Unclear if this has any bearing on her respiratory illness Recent urinary infection Grew pansensitive E. coli on 8/15, continue Keflex for 2 more days then stop completely Impaired glucose tolerance not  on diabetic agents at home No glycemic control currently other than sliding scale at this time and may not discharge on this-can continue gabapentin cautiously given advanced age  DVT prophylaxis: TED hose Code Status: Full Family Communication: d/w husband in detail Clinical Course as of 11/24/20 1841  Sun Nov 24, 2020  1146 Montel,[name] (Spouse)  [phone]   1431 Bernstein,[name] (Spouse)  [phone] (Mobile)  [phone] (Home Phone)    Disposition:  Status is: Observation  The patient will require care spanning > 2 midnights and should be moved to inpatient because: Hemodynamically unstable, Persistent severe electrolyte disturbances, and Ongoing diagnostic testing needed not appropriate for outpatient work up  Dispo: The patient is from: Home              Anticipated d/c is to: SNF              Patient currently is not medically stable to d/c.   Difficult to place patient No  Subjective: Awake slight incoherence Dizzy on moving ++ No CP fever chills No n/v--weak  Objective: Vitals:   11/24/20 2108 11/25/20 0056 11/25/20 0737 11/25/20 1133  BP:  118/74 (!) 140/52 (!) 145/48  Pulse:  64 65 (!) 58  Resp:  17 17 18   Temp:  98.2 F (36.8 C) 98.2 F (36.8 C) 98 F (36.7 C)  TempSrc: Oral Oral Oral   SpO2:  93% 96% 100%  Weight:        Intake/Output Summary (Last 24 hours) at 11/25/2020 1335 Last data filed at 11/24/2020 1827 Gross per 24 hour  Intake 500 ml  Output --  Net 500 ml   [employer]   11/24/20  2056  Weight: 67.8 kg    Examination:  Awake coherent in nad-scab lower lid of L eye S1 S2 no m/r/g Abd soft nt nd no rebound no guard Neuro intact no focal deficit ROM intact weak overall Skin soft supple  Data Reviewed: personally reviewed   CBC    Component Value Date/Time   WBC 5.8 11/24/2020 2156   RBC 3.27 (L) 11/24/2020 2156   HGB 10.0 (L) 11/24/2020 2156   HGB 11.2 04/10/2020 1532   HCT 32.9 (L) 11/24/2020 2156   HCT 34.7  04/10/2020 1532   PLT 240 11/24/2020 2156   PLT 223 04/10/2020 1532   MCV 100.6 (H) 11/24/2020 2156   MCV 94 04/10/2020 1532   MCH 30.6 11/24/2020 2156   MCHC 30.4 11/24/2020 2156   RDW 15.5 11/24/2020 2156   RDW 14.6 04/10/2020 1532   LYMPHSABS 0.6 (L) 11/24/2020 1615   LYMPHSABS 0.7 04/10/2020 1532   MONOABS 0.5 11/24/2020 1615   EOSABS 0.2 11/24/2020 1615   EOSABS 0.2 04/10/2020 1532   BASOSABS 0.0 11/24/2020 1615   BASOSABS 0.0 04/10/2020 1532   CMP Latest Ref Rng & Units 11/25/2020 11/24/2020 11/24/2020  Glucose 70 - 99 mg/dL 79 - 115(H)  BUN 8 - 23 mg/dL 21 - 23  Creatinine 0.44 - 1.00 mg/dL 1.13(H) 1.26(H) 1.41(H)  Sodium 135 - 145 mmol/L 141 - 141  Potassium 3.5 - 5.1 mmol/L 4.6 - 5.0  Chloride 98 - 111 mmol/L 99 - 96(L)  CO2 22 - 32 mmol/L 28 - 39(H)  Calcium 8.9 - 10.3 mg/dL 8.8(L) - 9.0  Total Protein 6.5 - 8.1 g/dL - - 7.1  Total Bilirubin 0.3 - 1.2 mg/dL - - 0.6  Alkaline Phos 38 - 126 U/L - - 72  AST 15 - 41 U/L - - 20  ALT 0 - 44 U/L - - 20     Radiology Studies: DG Chest 1 View  Result Date: 11/24/2020 CLINICAL DATA:  [place] at home today. Left hip pain. History of COPD. EXAM: CHEST  1 VIEW COMPARISON:  11/18/2020. FINDINGS: Cardiac silhouette is normal in size. No mediastinal or hilar masses. Interstitial thickening in the lower lungs, most evident extending from the inferior right hilum with evidence of bronchial wall thickening. These findings are similar to the prior exam. No lung consolidation. No evidence of pulmonary edema. No convincing pleural effusion and no pneumothorax. Skeletal structures are grossly intact. IMPRESSION: 1. No acute cardiopulmonary disease. 2. Chronic bronchitic changes most evident in the right lung base. Electronically Signed   By: Lajean Manes M.D.   On: 11/24/2020 12:47   CT HEAD WO CONTRAST (5MM)  Result Date: 11/24/2020 CLINICAL DATA:  Facial trauma; Neck trauma (Age >= 65y) EXAM: CT HEAD WITHOUT CONTRAST CT MAXILLOFACIAL WITHOUT  CONTRAST CT CERVICAL SPINE WITHOUT CONTRAST TECHNIQUE: Multidetector CT imaging of the head, cervical spine, and maxillofacial structures were performed using the standard protocol without intravenous contrast. Multiplanar CT image reconstructions of the cervical spine and maxillofacial structures were also generated. COMPARISON:  Head CT 07/31/2020, cervical spine CT 05/06/2018 FINDINGS: CT HEAD FINDINGS Brain: No evidence of acute intracranial hemorrhage or extra-axial collection.No evidence of mass lesion/concern mass effect.The ventricles are unchanged in size.Scattered subcortical and periventricular white matter hypodensities, nonspecific but likely sequela of chronic small vessel ischemic disease.Mild cerebral atrophy Vascular: No hyperdense vessel or unexpected calcification. Skull: Normal. Negative for fracture or focal lesion. Other: None. CT MAXILLOFACIAL FINDINGS Osseous: No fracture or mandibular dislocation. No destructive process. Orbits: Prior  bilateral lens surgeries. No traumatic or inflammatory finding. Sinuses: Mild mucosal thickening of the ethmoid air cells and sphenoid sinuses. Soft tissues: Negative CT CERVICAL SPINE FINDINGS Alignment: Normal. Skull base and vertebrae: Prior ACDF at C5-C6. Intact hardware without evidence of loosening. There is no evidence of acute cervical spine fracture. There is mild bilateral facet arthropathy. Mild degenerative disc disease at C4-C5 and C6-C7. Soft tissues and spinal canal: No prevertebral fluid or swelling. No visible canal hematoma. Disc levels: Preserved disc spaces.No large disc herniation, significant spinal canal or neuroforaminal narrowing. Upper chest: Paraseptal emphysema in the lung apices. Other: 1.3 cm right posterior thyroid nodule, unchanged from prior chest CT and does not require follow-up. IMPRESSION: No acute intracranial abnormality.  No acute facial fracture. No acute cervical spine fracture. Prior ACDF at C5-C6 without evidence of  hardware complication. Electronically Signed   By: Maurine Simmering M.D.   On: 11/24/2020 14:13   CT Cervical Spine Wo Contrast  Result Date: 11/24/2020 CLINICAL DATA:  Facial trauma; Neck trauma (Age >= 65y) EXAM: CT HEAD WITHOUT CONTRAST CT MAXILLOFACIAL WITHOUT CONTRAST CT CERVICAL SPINE WITHOUT CONTRAST TECHNIQUE: Multidetector CT imaging of the head, cervical spine, and maxillofacial structures were performed using the standard protocol without intravenous contrast. Multiplanar CT image reconstructions of the cervical spine and maxillofacial structures were also generated. COMPARISON:  Head CT 07/31/2020, cervical spine CT 05/06/2018 FINDINGS: CT HEAD FINDINGS Brain: No evidence of acute intracranial hemorrhage or extra-axial collection.No evidence of mass lesion/concern mass effect.The ventricles are unchanged in size.Scattered subcortical and periventricular white matter hypodensities, nonspecific but likely sequela of chronic small vessel ischemic disease.Mild cerebral atrophy Vascular: No hyperdense vessel or unexpected calcification. Skull: Normal. Negative for fracture or focal lesion. Other: None. CT MAXILLOFACIAL FINDINGS Osseous: No fracture or mandibular dislocation. No destructive process. Orbits: Prior bilateral lens surgeries. No traumatic or inflammatory finding. Sinuses: Mild mucosal thickening of the ethmoid air cells and sphenoid sinuses. Soft tissues: Negative CT CERVICAL SPINE FINDINGS Alignment: Normal. Skull base and vertebrae: Prior ACDF at C5-C6. Intact hardware without evidence of loosening. There is no evidence of acute cervical spine fracture. There is mild bilateral facet arthropathy. Mild degenerative disc disease at C4-C5 and C6-C7. Soft tissues and spinal canal: No prevertebral fluid or swelling. No visible canal hematoma. Disc levels: Preserved disc spaces.No large disc herniation, significant spinal canal or neuroforaminal narrowing. Upper chest: Paraseptal emphysema in the lung  apices. Other: 1.3 cm right posterior thyroid nodule, unchanged from prior chest CT and does not require follow-up. IMPRESSION: No acute intracranial abnormality.  No acute facial fracture. No acute cervical spine fracture. Prior ACDF at C5-C6 without evidence of hardware complication. Electronically Signed   By: Maurine Simmering M.D.   On: 11/24/2020 14:13   DG Hip Unilat W or Wo Pelvis 2-3 Views Left  Result Date: 11/24/2020 CLINICAL DATA:  [place] at home today.  Left hip pain. EXAM: DG HIP (WITH OR WITHOUT PELVIS) 2-3V LEFT COMPARISON:  None. FINDINGS: No convincing fracture.  No bone lesion. Hip joints, SI joints and symphysis pubis are normally spaced and aligned. Skeletal structures are demineralized. Soft tissues are unremarkable other than arterial atherosclerotic calcifications. IMPRESSION: 1. No fracture or dislocation. Electronically Signed   By: Lajean Manes M.D.   On: 11/24/2020 12:48   CT Maxillofacial Wo Contrast  Result Date: 11/24/2020 CLINICAL DATA:  Facial trauma; Neck trauma (Age >= 65y) EXAM: CT HEAD WITHOUT CONTRAST CT MAXILLOFACIAL WITHOUT CONTRAST CT CERVICAL SPINE WITHOUT CONTRAST TECHNIQUE: Multidetector CT imaging of  the head, cervical spine, and maxillofacial structures were performed using the standard protocol without intravenous contrast. Multiplanar CT image reconstructions of the cervical spine and maxillofacial structures were also generated. COMPARISON:  Head CT 07/31/2020, cervical spine CT 05/06/2018 FINDINGS: CT HEAD FINDINGS Brain: No evidence of acute intracranial hemorrhage or extra-axial collection.No evidence of mass lesion/concern mass effect.The ventricles are unchanged in size.Scattered subcortical and periventricular white matter hypodensities, nonspecific but likely sequela of chronic small vessel ischemic disease.Mild cerebral atrophy Vascular: No hyperdense vessel or unexpected calcification. Skull: Normal. Negative for fracture or focal lesion. Other: None. CT  MAXILLOFACIAL FINDINGS Osseous: No fracture or mandibular dislocation. No destructive process. Orbits: Prior bilateral lens surgeries. No traumatic or inflammatory finding. Sinuses: Mild mucosal thickening of the ethmoid air cells and sphenoid sinuses. Soft tissues: Negative CT CERVICAL SPINE FINDINGS Alignment: Normal. Skull base and vertebrae: Prior ACDF at C5-C6. Intact hardware without evidence of loosening. There is no evidence of acute cervical spine fracture. There is mild bilateral facet arthropathy. Mild degenerative disc disease at C4-C5 and C6-C7. Soft tissues and spinal canal: No prevertebral fluid or swelling. No visible canal hematoma. Disc levels: Preserved disc spaces.No large disc herniation, significant spinal canal or neuroforaminal narrowing. Upper chest: Paraseptal emphysema in the lung apices. Other: 1.3 cm right posterior thyroid nodule, unchanged from prior chest CT and does not require follow-up. IMPRESSION: No acute intracranial abnormality.  No acute facial fracture. No acute cervical spine fracture. Prior ACDF at C5-C6 without evidence of hardware complication. Electronically Signed   By: Maurine Simmering M.D.   On: 11/24/2020 14:13     Scheduled Meds:  amLODipine  5 mg Oral Daily   aspirin EC  81 mg Oral Daily   atorvastatin  40 mg Oral QHS   cephALEXin  500 mg Oral BID   cholecalciferol  1,000 Units Oral Daily   clopidogrel  75 mg Oral Daily   donepezil  5 mg Oral QHS   gabapentin  100 mg Oral BID   insulin aspart  0-15 Units Subcutaneous TID WC   [START ON 11/26/2020] metoprolol succinate  50 mg Oral Daily   multivitamin with minerals  0.5 tablet Oral BID   pantoprazole  40 mg Oral BID   sodium chloride flush  3 mL Intravenous Q12H   vitamin E  400 Units Oral Daily   zinc sulfate  220 mg Oral Daily   Continuous Infusions:  sodium chloride       LOS: 0 days   Time spent: [place], MD [facility] To contact the attending provider between  7A-7P or the covering provider during after hours 7P-7A, please log into the web site [email or web address] and access using universal [place] password for that web site. If you do not have the password, please call the hospital operator.  11/25/2020, 1:35 PM

## 2020-11-25 NOTE — Progress Notes (Signed)
Occupational Therapy Evaluation Patient Details Name: [name] [name] MRN: [id number] DOB: 1940/12/25 Today's Date: 11/25/2020    History of Present Illness [name] [name] is a 80 y.o. female who presented to the emergency department with progressive generalized weakness over the past week with a fall.  Patient has chronic debility and has been wheelchair-bound for years, but able to stand to assist with changing positions with the help of her husband. medical history significant for COPD with chronic respiratory failure on 3 L/min oxygen at home, lung cancer 4 years ago in remission,Coronary artery disease, hypertension, diabetes mellitus, hyperlipidemia, CVA, dementia, & chronic debility.   Clinical Impression   [name] [name] was evaluated s/p the above admission list. PTA pt required assistance for all aspects of her care at wc level, she has 6 STE her home and reports her grandson carries her up/down the steps. Upon evaluation, pt was min-mod A for all bed mobility, stand pivots and lateral scooting transfers. Overall, ADLs are set up level for sitting tasks and max A for lower body tasks. Pt on 3L O2 entire session. Pt would benefit from continued OT acutely. Recommend SNF at d/c.     Follow Up Recommendations  SNF    Equipment Recommendations  None recommended by OT       Precautions / Restrictions Precautions Precautions: Fall Precaution Comments: 3L O2 att times Restrictions Weight Bearing Restrictions: No      Mobility Bed Mobility Overal bed mobility: Needs Assistance Bed Mobility: Supine to Sit     Supine to sit: Min assist     General bed mobility comments: min A to elevate trunk    Transfers Overall transfer level: Needs assistance Equipment used: 1 person hand held assist Transfers: Sit to/from [employer];Lateral/Scoot Transfers Sit to Stand: Min assist Stand pivot transfers: Mod assist      Lateral/Scoot Transfers: Mod assist General transfer  comment: Pt able to sit<>stand with light min A to power up, required mod A for weight shifting in standing for stand pivot. pt able to scoot and boost with drop arm given mod A for shifting hips. pt reports her home wc has drop arm.    Balance Overall balance assessment: Needs assistance Sitting-balance support: Feet supported Sitting balance-Leahy Scale: Fair     Standing balance support: Bilateral upper extremity supported Standing balance-Leahy Scale: Poor               ADL either performed or assessed with clinical judgement   ADL Overall ADL's : Needs assistance/impaired Eating/Feeding: Set up;Sitting Eating/Feeding Details (indicate cue type and reason): assist for packages Grooming: Set up;Sitting   Upper Body Bathing: Minimal assistance;Sitting   Lower Body Bathing: Maximal assistance;Sit to/from stand   Upper Body Dressing : Set up;Sitting   Lower Body Dressing: Maximal assistance;Sit to/from stand   Toilet Transfer: Moderate assistance;Stand-pivot;BSC   Toileting- Clothing Manipulation and Hygiene: Maximal assistance;Sit to/from stand       Functional mobility during ADLs: Moderate assistance;Cueing for safety;Cueing for sequencing       Vision Patient Visual Report: No change from baseline Vision Assessment?: No apparent visual deficits            Pertinent Vitals/Pain Pain Assessment: Faces Pain Score: 0-No pain Faces Pain Scale: No hurt Pain Intervention(s): Monitored during session     Hand Dominance Right   Extremity/Trunk Assessment Upper Extremity Assessment Upper Extremity Assessment: Generalized weakness   Lower Extremity Assessment Lower Extremity Assessment: Defer to PT evaluation   Cervical /  Trunk Assessment Cervical / Trunk Assessment: Kyphotic   Communication Communication Communication: No difficulties   Cognition Arousal/Alertness: Awake/alert Behavior During Therapy: WFL for tasks assessed/performed Overall Cognitive  Status: No family/caregiver present to determine baseline cognitive functioning                 General Comments: Pt with slow processing, requries simple one step commands and incrased time for all BADLs   General Comments  VSS on 3L O2            Home Living Family/patient expects to be discharged to:: Private residence Living Arrangements: Spouse/significant other Available Help at Discharge: Family;Available 24 hours/day Type of Home: Mobile home Home Access: Stairs to enter Entrance Stairs-Number of Steps: 6   Home Layout: One level     Bathroom Shower/Tub: Occupational psychologist: Standard Bathroom Accessibility: Yes How Accessible: Accessible via wheelchair Home Equipment: [place] - 4 wheels;Bedside commode;Shower seat;Wheelchair - manual;Hospital bed          Prior Functioning/Environment Level of Independence: Needs assistance  Gait / Transfers Assistance Needed: WC bound, total A with wc mobility. Husband assists wtuh stand pivot trasnfers. Grandson carries her up/down the steps ADL's / Homemaking Assistance Needed: Assistance with ADLs, pt reports husband hels her transfer in/out of shower & on/off toilet. Assist with bathing and dressing. Communication / Swallowing Assistance Needed: HOH Comments: 3L home O2 at baseline        OT Problem List: Decreased strength;Decreased range of motion;Decreased activity tolerance;Impaired balance (sitting and/or standing);Decreased safety awareness;Decreased knowledge of use of DME or AE;Decreased knowledge of precautions;Pain      OT Treatment/Interventions: Self-care/ADL training;Therapeutic exercise;Balance training;Patient/family education;Therapeutic activities;DME and/or AE instruction    OT Goals(Current goals can be found in the care plan section) Acute Rehab OT Goals Patient Stated Goal: home soon OT Goal Formulation: With patient Time For Goal Achievement: 12/09/20 Potential to Achieve Goals:  Fair ADL Goals Pt Will Perform Lower Body Bathing: with min guard assist;sit to/from stand Pt Will Perform Lower Body Dressing: with min guard assist;sit to/from stand Pt Will Transfer to Toilet: with min guard assist;stand pivot transfer;bedside commode Pt Will Perform Tub/Shower Transfer: with min assist;shower seat;rolling walker Pt/caregiver will Perform Home Exercise Program: Increased strength;Both right and left upper extremity;With theraband;With Supervision;With written HEP provided  OT Frequency: Min 2X/week   Barriers to D/C: Inaccessible home environment  6 STE          AM-PAC OT "6 Clicks" Daily Activity     Outcome Measure Help from another person eating meals?: A Little Help from another person taking care of personal grooming?: A Little Help from another person toileting, which includes using toliet, bedpan, or urinal?: A Lot Help from another person bathing (including washing, rinsing, drying)?: A Lot Help from another person to put on and taking off regular upper body clothing?: A Little Help from another person to put on and taking off regular lower body clothing?: A Lot 6 Click Score: 15   End of Session Equipment Utilized During Treatment: Gait belt Nurse Communication: Mobility status;Precautions;Weight bearing status  Activity Tolerance: Patient tolerated treatment well Patient left: in chair;with call bell/phone within reach;with chair alarm set  OT Visit Diagnosis: Unsteadiness on feet (R26.81);Other abnormalities of gait and mobility (R26.89);Muscle weakness (generalized) (M62.81);History of falling (Z91.81);Pain                Time: 1610-9604 OT Time Calculation (min): 24 min Charges:  OT General Charges $OT Visit: 1  Visit OT Evaluation $OT Eval Moderate Complexity: 1 Mod OT Treatments $Self Care/Home Management : 8-22 mins   Bridger Pizzi A Lucetta Baehr 11/25/2020, 8:46 AM

## 2020-11-26 LAB — CBC WITH DIFFERENTIAL/PLATELET
Abs Immature Granulocytes: 0.03 10*3/uL (ref 0.00–0.07)
Basophils Absolute: 0 10*3/uL (ref 0.0–0.1)
Basophils Relative: 0 %
Eosinophils Absolute: 0.3 10*3/uL (ref 0.0–0.5)
Eosinophils Relative: 6 %
HCT: 31.5 % — ABNORMAL LOW (ref 36.0–46.0)
Hemoglobin: 10 g/dL — ABNORMAL LOW (ref 12.0–15.0)
Immature Granulocytes: 1 %
Lymphocytes Relative: 10 %
Lymphs Abs: 0.6 10*3/uL — ABNORMAL LOW (ref 0.7–4.0)
MCH: 31.2 pg (ref 26.0–34.0)
MCHC: 31.7 g/dL (ref 30.0–36.0)
MCV: 98.1 fL (ref 80.0–100.0)
Monocytes Absolute: 0.5 10*3/uL (ref 0.1–1.0)
Monocytes Relative: 9 %
Neutro Abs: 4.4 10*3/uL (ref 1.7–7.7)
Neutrophils Relative %: 74 %
Platelets: 224 10*3/uL (ref 150–400)
RBC: 3.21 MIL/uL — ABNORMAL LOW (ref 3.87–5.11)
RDW: 15.1 % (ref 11.5–15.5)
WBC: 5.9 10*3/uL (ref 4.0–10.5)
nRBC: 0 % (ref 0.0–0.2)

## 2020-11-26 LAB — COMPREHENSIVE METABOLIC PANEL
ALT: 16 U/L (ref 0–44)
AST: 17 U/L (ref 15–41)
Albumin: 2.7 g/dL — ABNORMAL LOW (ref 3.5–5.0)
Alkaline Phosphatase: 62 U/L (ref 38–126)
Anion gap: 9 (ref 5–15)
BUN: 14 mg/dL (ref 8–23)
CO2: 33 mmol/L — ABNORMAL HIGH (ref 22–32)
Calcium: 8.6 mg/dL — ABNORMAL LOW (ref 8.9–10.3)
Chloride: 98 mmol/L (ref 98–111)
Creatinine, Ser: 0.97 mg/dL (ref 0.44–1.00)
GFR, Estimated: 60 mL/min — ABNORMAL LOW (ref >60)
Glucose, Bld: 84 mg/dL (ref 70–99)
Potassium: 4.2 mmol/L (ref 3.5–5.1)
Sodium: 140 mmol/L (ref 135–145)
Total Bilirubin: 0.7 mg/dL (ref 0.3–1.2)
Total Protein: 6 g/dL — ABNORMAL LOW (ref 6.5–8.1)

## 2020-11-26 LAB — GLUCOSE, CAPILLARY
Glucose-Capillary: 96 mg/dL (ref 70–99)
Glucose-Capillary: 94 mg/dL (ref 70–99)
Glucose-Capillary: 90 mg/dL (ref 70–99)
Glucose-Capillary: 130 mg/dL — ABNORMAL HIGH (ref 70–99)

## 2020-11-26 MED ORDER — GABAPENTIN 100 MG PO CAPS: 100.0000 mg | ORAL_CAPSULE | Freq: Two times a day (BID) | ORAL | Status: DC

## 2020-11-26 MED ORDER — METOPROLOL SUCCINATE ER 50 MG PO TB24: 50.0000 mg | ORAL_TABLET | Freq: Every day | ORAL | Status: DC

## 2020-11-26 NOTE — Progress Notes (Signed)
Patient seen and examined at bedside and agree with plan of care-she seems somewhat improved I reviewed orthostatics per nursing and it appears that she does drop when she stands I have adjusted her blood pressure medications and discontinued IV fluids  I think she is reasonably stabilized for DC to a skilled facility short-term and I have discussed this with her husband who is at bedside  See discharge summary dated today I expect patient can discharge if no impediments or new complications on 4/19 [id number] test ordered  Verneita Griffes, MD [facility] 4:30 PM

## 2020-11-26 NOTE — Plan of Care (Signed)

## 2020-11-26 NOTE — Discharge Summary (Addendum)
Physician Discharge Summary  [name] [name] VQQ:[id number] DOB: 12/30/1940 DOA: 11/24/2020  PCP: Jerrol Banana., MD  Admit date: 11/24/2020 Discharge date: 11/26/2020  Time spent: 36 minutes  Recommendations for Outpatient Follow-up:  Medications adjusted as per MAR-specifically if cut back on gabapentin dosing given her age and cut back on metoprolol XL given patient's orthostasis Would recommend further de-escalation of meds in the outpatient setting as appropriate May need work-up or reevaluation of carotids in the outpatient setting by vascular surgeon as has some chronic stenosis which could be contributing to her syncope-CC Dr. Lucky Cowboy Please clarify in the outpatient whether should be on aspirin and Plavix versus just monotherapy  Discharge Diagnoses:  MAIN problem for hospitalization   Orthostatic hypotension with fall on admission  Please see below for itemized issues addressed in [place]- refer to other progress notes for clarity if needed  Discharge Condition: Improved  Diet recommendation: Heart healthy  Filed Weights   11/24/20 2056  Weight: 67.8 kg    History of present illness:  80 year old white female from home COPD/3 L oxygen + lung cancer 4 years ago in remission status post surgery Multi-infarct with dementia 2/2 CVA CAD with NSTEMI 01/29/2015, DM TY 2 CKD stage III AA, HLD, chronic left-sided carotid stenosis >70% referred to Dr. Lucky Cowboy [facility] for carotid stenosis COVID-19 infection 08/02/2020   Seen [facility] ED 11/18/2020-several days bilateral leg weakness inability to walk-thought to have UTI given dose of Rocephin Rx Keflex and discharged   Came to [facility] ED 8/21 weakness fall found down on floor 30 minutes alert to self --placed on 2 L oxygen   Admitted for syncope work-up to [facility] Course:  Syncope + fall Etiology likely secondary to continued orthostasis--saline discontinued after 36 hours of use Stop further work-up at this time-only piece of  information to be noted is patient has some chronic carotid stenosis and this may need to be worked up at [facility] by Dr. Lucky Cowboy? adjust metoprolol downwards to 50 XL continue amlodipine at current dose Orthostatics remain positive-telemetry is benign except for 1-2 PVCs Can discharge to skilled facility when bed available COPD on 3 L baseline oxygen---has been on this before Covid Continue DuoNeb every 4 as needed no active signs symptoms exacerbation Prior NSTEMI with CAD Continue metoprolol 50 instead of 100--- continue to adjust meds as outpatient Continue Plavix 75 daily aspirin 81 daily-clarification needed by cardiology in the outpatient setting regarding dosages of these Isosorbide dinitrate discontinued Keflex completed on 8/22 which would be more than enough treatment for simple cystitis Chronic left-sided carotid stenosis past Outpatient reeval COVID recovered 2022 Unclear if this has any bearing on her respiratory illness Recent urinary infection without signs of infections Grew pansensitive E. coli on 8/15, continue Keflex for 2 more days then stop completely Impaired glucose tolerance not on diabetic agents at home No glycemic control currently other than sliding scale at this time and may not discharge on this-can continue gabapentin at lower dose as per [facility]    Discharge Exam: Vitals:   11/26/20 1450 11/26/20 1500  BP: 135/73 (!) 160/60  Pulse: 82 65  Resp:  17  Temp:  98.5 F (36.9 C)  SpO2:  94%    Subj on day of d/c   Awake coherent no distress Still some dizzy --resolves quickly after sitting ambulated some No fever no chills no nausea no vomiting No focal neuro deficit, power 5/5 bilat Reflexes not tested  General Exam on discharge  Coherent pleasant no  distress EOMI NCAT no focal deficit CTA B no rales rhonchi ROM intact power 5/5 Patient has a cut underneath her left lower eyelid S1-S2 no murmur does have PVC Abdomen soft no rebound no  guarding Neurologically intact  Discharge Instructions   Discharge Instructions     Diet - low sodium heart healthy   Complete by: As directed    Increase activity slowly   Complete by: As directed       Allergies as of 11/26/2020       Reactions   Coconut Fatty Acids Swelling, Other (See Comments)   Throat swells        Medication List     STOP taking these medications    Accu-Chek FastClix Lancets Misc   Accu-Chek Guide test strip Generic drug: glucose blood   Accu-Chek Guide w/Device Kit   albuterol 108 (90 Base) MCG/ACT inhaler Commonly known as: VENTOLIN HFA   cephALEXin 500 MG capsule Commonly known as: KEFLEX   Comfeel Plus Ulcer Dressing Pads   doxycycline 100 MG tablet Commonly known as: VIBRA-TABS   gabapentin 600 MG tablet Commonly known as: NEURONTIN Replaced by: gabapentin 100 MG capsule   ipratropium-albuterol 0.5-2.5 (3) MG/3ML Soln Commonly known as: DUONEB   isosorbide mononitrate 60 MG 24 hr tablet Commonly known as: IMDUR   mometasone 0.1 % lotion Commonly known as: ELOCON   nitroGLYCERIN 0.4 MG SL tablet Commonly known as: NITROSTAT   predniSONE 20 MG tablet Commonly known as: DELTASONE   protective barrier Crea       TAKE these medications    amLODipine 5 MG tablet Commonly known as: NORVASC TAKE 1 TABLET BY MOUTH EVERY DAY   aspirin 81 MG EC tablet Take 1 tablet (81 mg total) by mouth daily.   atorvastatin 40 MG tablet Commonly known as: LIPITOR TAKE 1 TABLET BY MOUTH  DAILY AT 6 PM   clopidogrel 75 MG tablet Commonly known as: PLAVIX TAKE 1 TABLET BY MOUTH  DAILY   donepezil 5 MG tablet Commonly known as: ARICEPT TAKE 1 TABLET BY MOUTH EVERYDAY AT BEDTIME   gabapentin 100 MG capsule Commonly known as: NEURONTIN Take 1 capsule (100 mg total) by mouth 2 (two) times daily. Replaces: gabapentin 600 MG tablet   magnesium oxide 400 (241.3 Mg) MG tablet Commonly known as: MAG-OX Take 400 mg by mouth 2  (two) times daily.   metoprolol succinate 50 MG 24 hr tablet Commonly known as: TOPROL-XL Take 1 tablet (50 mg total) by mouth daily. Take with or immediately following a meal. Start taking on: November 27, 2020 What changed:  medication strength how much to take additional instructions   multivitamin tablet Take 0.5 tablets by mouth 2 (two) times daily.   pantoprazole 40 MG tablet Commonly known as: PROTONIX TAKE 1 TABLET BY MOUTH TWICE A DAY   VITAMIN D PO Take by mouth daily.   vitamin E 180 MG (400 UNITS) capsule Take 400 Units by mouth daily.   zinc sulfate 220 (50 Zn) MG capsule Take 220 mg by mouth daily.       Allergies  Allergen Reactions   Coconut Fatty Acids Swelling and Other (See Comments)    Throat swells      The results of significant diagnostics from this hospitalization (including imaging, microbiology, ancillary and laboratory) are listed below for reference.    Significant Diagnostic Studies: DG Chest 1 View  Result Date: 11/24/2020 CLINICAL DATA:  [place] at home today. Left hip pain. History of COPD.  EXAM: CHEST  1 VIEW COMPARISON:  11/18/2020. FINDINGS: Cardiac silhouette is normal in size. No mediastinal or hilar masses. Interstitial thickening in the lower lungs, most evident extending from the inferior right hilum with evidence of bronchial wall thickening. These findings are similar to the prior exam. No lung consolidation. No evidence of pulmonary edema. No convincing pleural effusion and no pneumothorax. Skeletal structures are grossly intact. IMPRESSION: 1. No acute cardiopulmonary disease. 2. Chronic bronchitic changes most evident in the right lung base. Electronically Signed   By: Lajean Manes M.D.   On: 11/24/2020 12:47   DG Chest 2 View  Result Date: 11/18/2020 CLINICAL DATA:  Weakness EXAM: CHEST - 2 VIEW COMPARISON:  07/31/2020 FINDINGS: Cardiac shadow is stable. Lungs are well aerated bilaterally. No focal infiltrate or sizable effusion  is seen. No bony abnormality is noted. Postsurgical changes in the cervical spine are seen. IMPRESSION: No active cardiopulmonary disease. Electronically Signed   By: Inez Catalina M.D.   On: 11/18/2020 17:41   CT HEAD WO CONTRAST (5MM)  Result Date: 11/24/2020 CLINICAL DATA:  Facial trauma; Neck trauma (Age >= 65y) EXAM: CT HEAD WITHOUT CONTRAST CT MAXILLOFACIAL WITHOUT CONTRAST CT CERVICAL SPINE WITHOUT CONTRAST TECHNIQUE: Multidetector CT imaging of the head, cervical spine, and maxillofacial structures were performed using the standard protocol without intravenous contrast. Multiplanar CT image reconstructions of the cervical spine and maxillofacial structures were also generated. COMPARISON:  Head CT 07/31/2020, cervical spine CT 05/06/2018 FINDINGS: CT HEAD FINDINGS Brain: No evidence of acute intracranial hemorrhage or extra-axial collection.No evidence of mass lesion/concern mass effect.The ventricles are unchanged in size.Scattered subcortical and periventricular white matter hypodensities, nonspecific but likely sequela of chronic small vessel ischemic disease.Mild cerebral atrophy Vascular: No hyperdense vessel or unexpected calcification. Skull: Normal. Negative for fracture or focal lesion. Other: None. CT MAXILLOFACIAL FINDINGS Osseous: No fracture or mandibular dislocation. No destructive process. Orbits: Prior bilateral lens surgeries. No traumatic or inflammatory finding. Sinuses: Mild mucosal thickening of the ethmoid air cells and sphenoid sinuses. Soft tissues: Negative CT CERVICAL SPINE FINDINGS Alignment: Normal. Skull base and vertebrae: Prior ACDF at C5-C6. Intact hardware without evidence of loosening. There is no evidence of acute cervical spine fracture. There is mild bilateral facet arthropathy. Mild degenerative disc disease at C4-C5 and C6-C7. Soft tissues and spinal canal: No prevertebral fluid or swelling. No visible canal hematoma. Disc levels: Preserved disc spaces.No large disc  herniation, significant spinal canal or neuroforaminal narrowing. Upper chest: Paraseptal emphysema in the lung apices. Other: 1.3 cm right posterior thyroid nodule, unchanged from prior chest CT and does not require follow-up. IMPRESSION: No acute intracranial abnormality.  No acute facial fracture. No acute cervical spine fracture. Prior ACDF at C5-C6 without evidence of hardware complication. Electronically Signed   By: Maurine Simmering M.D.   On: 11/24/2020 14:13   CT Cervical Spine Wo Contrast  Result Date: 11/24/2020 CLINICAL DATA:  Facial trauma; Neck trauma (Age >= 65y) EXAM: CT HEAD WITHOUT CONTRAST CT MAXILLOFACIAL WITHOUT CONTRAST CT CERVICAL SPINE WITHOUT CONTRAST TECHNIQUE: Multidetector CT imaging of the head, cervical spine, and maxillofacial structures were performed using the standard protocol without intravenous contrast. Multiplanar CT image reconstructions of the cervical spine and maxillofacial structures were also generated. COMPARISON:  Head CT 07/31/2020, cervical spine CT 05/06/2018 FINDINGS: CT HEAD FINDINGS Brain: No evidence of acute intracranial hemorrhage or extra-axial collection.No evidence of mass lesion/concern mass effect.The ventricles are unchanged in size.Scattered subcortical and periventricular white matter hypodensities, nonspecific but likely sequela of chronic small  vessel ischemic disease.Mild cerebral atrophy Vascular: No hyperdense vessel or unexpected calcification. Skull: Normal. Negative for fracture or focal lesion. Other: None. CT MAXILLOFACIAL FINDINGS Osseous: No fracture or mandibular dislocation. No destructive process. Orbits: Prior bilateral lens surgeries. No traumatic or inflammatory finding. Sinuses: Mild mucosal thickening of the ethmoid air cells and sphenoid sinuses. Soft tissues: Negative CT CERVICAL SPINE FINDINGS Alignment: Normal. Skull base and vertebrae: Prior ACDF at C5-C6. Intact hardware without evidence of loosening. There is no evidence of  acute cervical spine fracture. There is mild bilateral facet arthropathy. Mild degenerative disc disease at C4-C5 and C6-C7. Soft tissues and spinal canal: No prevertebral fluid or swelling. No visible canal hematoma. Disc levels: Preserved disc spaces.No large disc herniation, significant spinal canal or neuroforaminal narrowing. Upper chest: Paraseptal emphysema in the lung apices. Other: 1.3 cm right posterior thyroid nodule, unchanged from prior chest CT and does not require follow-up. IMPRESSION: No acute intracranial abnormality.  No acute facial fracture. No acute cervical spine fracture. Prior ACDF at C5-C6 without evidence of hardware complication. Electronically Signed   By: Maurine Simmering M.D.   On: 11/24/2020 14:13   DG Hip Unilat W or Wo Pelvis 2-3 Views Left  Result Date: 11/24/2020 CLINICAL DATA:  [place] at home today.  Left hip pain. EXAM: DG HIP (WITH OR WITHOUT PELVIS) 2-3V LEFT COMPARISON:  None. FINDINGS: No convincing fracture.  No bone lesion. Hip joints, SI joints and symphysis pubis are normally spaced and aligned. Skeletal structures are demineralized. Soft tissues are unremarkable other than arterial atherosclerotic calcifications. IMPRESSION: 1. No fracture or dislocation. Electronically Signed   By: Lajean Manes M.D.   On: 11/24/2020 12:48   CT Maxillofacial Wo Contrast  Result Date: 11/24/2020 CLINICAL DATA:  Facial trauma; Neck trauma (Age >= 65y) EXAM: CT HEAD WITHOUT CONTRAST CT MAXILLOFACIAL WITHOUT CONTRAST CT CERVICAL SPINE WITHOUT CONTRAST TECHNIQUE: Multidetector CT imaging of the head, cervical spine, and maxillofacial structures were performed using the standard protocol without intravenous contrast. Multiplanar CT image reconstructions of the cervical spine and maxillofacial structures were also generated. COMPARISON:  Head CT 07/31/2020, cervical spine CT 05/06/2018 FINDINGS: CT HEAD FINDINGS Brain: No evidence of acute intracranial hemorrhage or extra-axial collection.No  evidence of mass lesion/concern mass effect.The ventricles are unchanged in size.Scattered subcortical and periventricular white matter hypodensities, nonspecific but likely sequela of chronic small vessel ischemic disease.Mild cerebral atrophy Vascular: No hyperdense vessel or unexpected calcification. Skull: Normal. Negative for fracture or focal lesion. Other: None. CT MAXILLOFACIAL FINDINGS Osseous: No fracture or mandibular dislocation. No destructive process. Orbits: Prior bilateral lens surgeries. No traumatic or inflammatory finding. Sinuses: Mild mucosal thickening of the ethmoid air cells and sphenoid sinuses. Soft tissues: Negative CT CERVICAL SPINE FINDINGS Alignment: Normal. Skull base and vertebrae: Prior ACDF at C5-C6. Intact hardware without evidence of loosening. There is no evidence of acute cervical spine fracture. There is mild bilateral facet arthropathy. Mild degenerative disc disease at C4-C5 and C6-C7. Soft tissues and spinal canal: No prevertebral fluid or swelling. No visible canal hematoma. Disc levels: Preserved disc spaces.No large disc herniation, significant spinal canal or neuroforaminal narrowing. Upper chest: Paraseptal emphysema in the lung apices. Other: 1.3 cm right posterior thyroid nodule, unchanged from prior chest CT and does not require follow-up. IMPRESSION: No acute intracranial abnormality.  No acute facial fracture. No acute cervical spine fracture. Prior ACDF at C5-C6 without evidence of hardware complication. Electronically Signed   By: Maurine Simmering M.D.   On: 11/24/2020 14:13    Microbiology:  Recent Results (from the past 240 hour(s))  Resp Panel by RT-PCR (Flu A&B, Covid) Nasopharyngeal Swab     Status: None   Collection Time: 11/18/20  4:42 PM   Specimen: Nasopharyngeal Swab; Nasopharyngeal(NP) swabs in vial transport medium  Result Value Ref Range Status   SARS Coronavirus 2 by RT PCR NEGATIVE NEGATIVE Final    Comment: (NOTE) SARS-CoV-2 target nucleic  acids are NOT DETECTED.  The SARS-CoV-2 RNA is generally detectable in upper respiratory specimens during the acute phase of infection. The lowest concentration of SARS-CoV-2 viral copies this assay can detect is 138 copies/mL. A negative result does not preclude SARS-Cov-2 infection and should not be used as the sole basis for treatment or other patient management decisions. A negative result may occur with  improper specimen collection/handling, submission of specimen other than nasopharyngeal swab, presence of viral mutation(s) within the areas targeted by this assay, and inadequate number of viral copies(<138 copies/mL). A negative result must be combined with clinical observations, patient history, and epidemiological information. The expected result is Negative.  Fact Sheet for Patients:  EntrepreneurPulse.com.au  Fact Sheet for Healthcare Providers:  IncredibleEmployment.be  This test is no t yet approved or cleared by the [place] FDA and  has been authorized for detection and/or diagnosis of SARS-CoV-2 by FDA under an Emergency Use Authorization (EUA). This EUA will remain  in effect (meaning this test can be used) for the duration of the COVID-19 declaration under Section 564(b)(1) of the Act, 21 U.S.C.section 360bbb-3(b)(1), unless the authorization is terminated  or revoked sooner.       Influenza A by PCR NEGATIVE NEGATIVE Final   Influenza B by PCR NEGATIVE NEGATIVE Final    Comment: (NOTE) The Xpert Xpress SARS-CoV-2/FLU/RSV plus assay is intended as an aid in the diagnosis of influenza from Nasopharyngeal swab specimens and should not be used as a sole basis for treatment. Nasal washings and aspirates are unacceptable for Xpert Xpress SARS-CoV-2/FLU/RSV testing.  Fact Sheet for Patients: EntrepreneurPulse.com.au  Fact Sheet for Healthcare Providers: IncredibleEmployment.be  This  test is not yet approved or cleared by the [place] FDA and has been authorized for detection and/or diagnosis of SARS-CoV-2 by FDA under an Emergency Use Authorization (EUA). This EUA will remain in effect (meaning this test can be used) for the duration of the COVID-19 declaration under Section 564(b)(1) of the Act, 21 U.S.C. section 360bbb-3(b)(1), unless the authorization is terminated or revoked.  Performed at [facility], [address], [place], [place] [address]   Urine Culture     Status: Abnormal   Collection Time: 11/18/20  4:42 PM   Specimen: Urine, Clean Catch  Result Value Ref Range Status   Specimen Description   Final    URINE, CLEAN CATCH Performed at [facility], [address], [place], [place] [address]    Special Requests   Final    NONE Performed at [facility], [place] [address] [place], [place] [address]    Culture >=100,000 COLONIES/mL ESCHERICHIA COLI (A)  Final   Report Status 11/21/2020 FINAL  Final   Organism ID, Bacteria ESCHERICHIA COLI (A)  Final      Susceptibility   Escherichia coli - MIC*    AMPICILLIN 8 SENSITIVE Sensitive     CEFAZOLIN <=4 SENSITIVE Sensitive     CEFEPIME <=0.12 SENSITIVE Sensitive     CEFTRIAXONE <=0.25 SENSITIVE Sensitive     CIPROFLOXACIN <=0.25 SENSITIVE Sensitive     GENTAMICIN <=1 SENSITIVE Sensitive  IMIPENEM <=0.25 SENSITIVE Sensitive     NITROFURANTOIN <=16 SENSITIVE Sensitive     TRIMETH/SULFA <=20 SENSITIVE Sensitive     AMPICILLIN/SULBACTAM 4 SENSITIVE Sensitive     PIP/TAZO <=4 SENSITIVE Sensitive     * >=100,000 COLONIES/mL ESCHERICHIA COLI  Resp Panel by RT-PCR (Flu A&B, Covid) Nasopharyngeal Swab     Status: None   Collection Time: 11/24/20 11:46 AM   Specimen: Nasopharyngeal Swab; Nasopharyngeal(NP) swabs in vial transport medium  Result Value Ref Range Status   SARS Coronavirus 2 by RT PCR NEGATIVE NEGATIVE Final    Comment: (NOTE) SARS-CoV-2 target nucleic  acids are NOT DETECTED.  The SARS-CoV-2 RNA is generally detectable in upper respiratory specimens during the acute phase of infection. The lowest concentration of SARS-CoV-2 viral copies this assay can detect is 138 copies/mL. A negative result does not preclude SARS-Cov-2 infection and should not be used as the sole basis for treatment or other patient management decisions. A negative result may occur with  improper specimen collection/handling, submission of specimen other than nasopharyngeal swab, presence of viral mutation(s) within the areas targeted by this assay, and inadequate number of viral copies(<138 copies/mL). A negative result must be combined with clinical observations, patient history, and epidemiological information. The expected result is Negative.  Fact Sheet for Patients:  EntrepreneurPulse.com.au  Fact Sheet for Healthcare Providers:  IncredibleEmployment.be  This test is no t yet approved or cleared by the [place] FDA and  has been authorized for detection and/or diagnosis of SARS-CoV-2 by FDA under an Emergency Use Authorization (EUA). This EUA will remain  in effect (meaning this test can be used) for the duration of the COVID-19 declaration under Section 564(b)(1) of the Act, 21 U.S.C.section 360bbb-3(b)(1), unless the authorization is terminated  or revoked sooner.       Influenza A by PCR NEGATIVE NEGATIVE Final   Influenza B by PCR NEGATIVE NEGATIVE Final    Comment: (NOTE) The Xpert Xpress SARS-CoV-2/FLU/RSV plus assay is intended as an aid in the diagnosis of influenza from Nasopharyngeal swab specimens and should not be used as a sole basis for treatment. Nasal washings and aspirates are unacceptable for Xpert Xpress SARS-CoV-2/FLU/RSV testing.  Fact Sheet for Patients: EntrepreneurPulse.com.au  Fact Sheet for Healthcare Providers: IncredibleEmployment.be  This  test is not yet approved or cleared by the [place] FDA and has been authorized for detection and/or diagnosis of SARS-CoV-2 by FDA under an Emergency Use Authorization (EUA). This EUA will remain in effect (meaning this test can be used) for the duration of the COVID-19 declaration under Section 564(b)(1) of the Act, 21 U.S.C. section 360bbb-3(b)(1), unless the authorization is terminated or revoked.  Performed at [facility], [place] [address], [place], [place] [address]      Labs: Basic Metabolic Panel: Recent Labs  Lab 11/24/20 1143 11/24/20 2156 11/25/20 0128 11/26/20 0154  NA 141  --  141 140  K 5.0  --  4.6 4.2  CL 96*  --  99 98  CO2 39*  --  28 33*  GLUCOSE 115*  --  79 84  BUN 23  --  21 14  CREATININE 1.41* 1.26* 1.13* 0.97  CALCIUM 9.0  --  8.8* 8.6*  MG 2.8*  --   --   --    Liver Function Tests: Recent Labs  Lab 11/24/20 1143 11/26/20 0154  AST 20 17  ALT 20 16  ALKPHOS 72 62  BILITOT 0.6 0.7  PROT 7.1 6.0*  ALBUMIN  3.1* 2.7*   No results for input(s): LIPASE, AMYLASE in the last 168 hours. No results for input(s): AMMONIA in the last 168 hours. CBC: Recent Labs  Lab 11/24/20 1615 11/24/20 2156 11/26/20 0154  WBC 6.5 5.8 5.9  NEUTROABS 5.2  --  4.4  HGB 10.4* 10.0* 10.0*  HCT 33.9* 32.9* 31.5*  MCV 100.0 100.6* 98.1  PLT 249 240 224   Cardiac Enzymes: Recent Labs  Lab 11/24/20 1143  CKTOTAL 44   BNP: BNP (last 3 results) No results for input(s): BNP in the last 8760 hours.  ProBNP (last 3 results) No results for input(s): PROBNP in the last 8760 hours.  CBG: Recent Labs  Lab 11/25/20 1556 11/25/20 2113 11/26/20 0757 11/26/20 1101 11/26/20 1524  GLUCAP 84 128* 96 94 90       Signed:  Nita Sells MD   [facility] 11/26/2020, 4:30 PM

## 2020-11-26 NOTE — Plan of Care (Signed)
  Problem: Education: Goal: Knowledge of General Education information will improve Description: Including pain rating scale, medication(s)/side effects and non-pharmacologic comfort measures Outcome: Progressing   Problem: Health Behavior/Discharge Planning: Goal: Ability to manage health-related needs will improve Outcome: Progressing   Problem: Coping: Goal: Level of anxiety will decrease Outcome: Progressing   Problem: Pain Managment: Goal: General experience of comfort will improve Outcome: Progressing   Problem: Safety: Goal: Ability to remain free from injury will improve Outcome: Progressing   Problem: Skin Integrity: Goal: Risk for impaired skin integrity will decrease Outcome: Completed/Met

## 2020-11-27 DIAGNOSIS — N189 Chronic kidney disease, unspecified: Secondary | ICD-10-CM | POA: Diagnosis not present

## 2020-11-27 DIAGNOSIS — R410 Disorientation, unspecified: Secondary | ICD-10-CM | POA: Diagnosis not present

## 2020-11-27 DIAGNOSIS — J449 Chronic obstructive pulmonary disease, unspecified: Secondary | ICD-10-CM | POA: Diagnosis not present

## 2020-11-27 DIAGNOSIS — E1140 Type 2 diabetes mellitus with diabetic neuropathy, unspecified: Secondary | ICD-10-CM | POA: Diagnosis not present

## 2020-11-27 DIAGNOSIS — E43 Unspecified severe protein-calorie malnutrition: Secondary | ICD-10-CM | POA: Diagnosis not present

## 2020-11-27 DIAGNOSIS — J069 Acute upper respiratory infection, unspecified: Secondary | ICD-10-CM | POA: Diagnosis not present

## 2020-11-27 DIAGNOSIS — R2681 Unsteadiness on feet: Secondary | ICD-10-CM | POA: Diagnosis not present

## 2020-11-27 DIAGNOSIS — J439 Emphysema, unspecified: Secondary | ICD-10-CM | POA: Diagnosis not present

## 2020-11-27 DIAGNOSIS — J9601 Acute respiratory failure with hypoxia: Secondary | ICD-10-CM | POA: Diagnosis not present

## 2020-11-27 DIAGNOSIS — R404 Transient alteration of awareness: Secondary | ICD-10-CM | POA: Diagnosis not present

## 2020-11-27 DIAGNOSIS — C3411 Malignant neoplasm of upper lobe, right bronchus or lung: Secondary | ICD-10-CM | POA: Diagnosis not present

## 2020-11-27 DIAGNOSIS — J9690 Respiratory failure, unspecified, unspecified whether with hypoxia or hypercapnia: Secondary | ICD-10-CM | POA: Diagnosis not present

## 2020-11-27 DIAGNOSIS — I2510 Atherosclerotic heart disease of native coronary artery without angina pectoris: Secondary | ICD-10-CM | POA: Diagnosis not present

## 2020-11-27 DIAGNOSIS — R531 Weakness: Secondary | ICD-10-CM | POA: Diagnosis not present

## 2020-11-27 DIAGNOSIS — I69391 Dysphagia following cerebral infarction: Secondary | ICD-10-CM | POA: Diagnosis not present

## 2020-11-27 DIAGNOSIS — R1312 Dysphagia, oropharyngeal phase: Secondary | ICD-10-CM | POA: Diagnosis not present

## 2020-11-27 DIAGNOSIS — G629 Polyneuropathy, unspecified: Secondary | ICD-10-CM | POA: Diagnosis not present

## 2020-11-27 DIAGNOSIS — Z862 Personal history of diseases of the blood and blood-forming organs and certain disorders involving the immune mechanism: Secondary | ICD-10-CM | POA: Diagnosis not present

## 2020-11-27 DIAGNOSIS — Z743 Need for continuous supervision: Secondary | ICD-10-CM | POA: Diagnosis not present

## 2020-11-27 DIAGNOSIS — J432 Centrilobular emphysema: Secondary | ICD-10-CM | POA: Diagnosis not present

## 2020-11-27 DIAGNOSIS — Z8719 Personal history of other diseases of the digestive system: Secondary | ICD-10-CM | POA: Diagnosis not present

## 2020-11-27 DIAGNOSIS — N179 Acute kidney failure, unspecified: Secondary | ICD-10-CM | POA: Diagnosis not present

## 2020-11-27 DIAGNOSIS — I69828 Other speech and language deficits following other cerebrovascular disease: Secondary | ICD-10-CM | POA: Diagnosis not present

## 2020-11-27 DIAGNOSIS — E1122 Type 2 diabetes mellitus with diabetic chronic kidney disease: Secondary | ICD-10-CM | POA: Diagnosis not present

## 2020-11-27 DIAGNOSIS — N183 Chronic kidney disease, stage 3 unspecified: Secondary | ICD-10-CM | POA: Diagnosis not present

## 2020-11-27 DIAGNOSIS — R55 Syncope and collapse: Secondary | ICD-10-CM | POA: Diagnosis not present

## 2020-11-27 DIAGNOSIS — M6281 Muscle weakness (generalized): Secondary | ICD-10-CM | POA: Diagnosis not present

## 2020-11-27 DIAGNOSIS — R29818 Other symptoms and signs involving the nervous system: Secondary | ICD-10-CM | POA: Diagnosis not present

## 2020-11-27 DIAGNOSIS — K5901 Slow transit constipation: Secondary | ICD-10-CM | POA: Diagnosis not present

## 2020-11-27 DIAGNOSIS — R29898 Other symptoms and signs involving the musculoskeletal system: Secondary | ICD-10-CM | POA: Diagnosis not present

## 2020-11-27 DIAGNOSIS — E1142 Type 2 diabetes mellitus with diabetic polyneuropathy: Secondary | ICD-10-CM | POA: Diagnosis not present

## 2020-11-27 DIAGNOSIS — I10 Essential (primary) hypertension: Secondary | ICD-10-CM | POA: Diagnosis not present

## 2020-11-27 DIAGNOSIS — Z7401 Bed confinement status: Secondary | ICD-10-CM | POA: Diagnosis not present

## 2020-11-27 LAB — SARS CORONAVIRUS 2 (TAT 6-24 HRS): SARS Coronavirus 2: NEGATIVE

## 2020-11-27 LAB — GLUCOSE, CAPILLARY
Glucose-Capillary: 78 mg/dL (ref 70–99)
Glucose-Capillary: 110 mg/dL — ABNORMAL HIGH (ref 70–99)

## 2020-11-27 MED ORDER — IPRATROPIUM-ALBUTEROL 0.5-2.5 (3) MG/3ML IN SOLN
3.0000 mL | Freq: Four times a day (QID) | RESPIRATORY_TRACT | Status: DC
  Administered 2020-11-27: 3 mL via RESPIRATORY_TRACT
  Filled 2020-11-27: qty 3

## 2020-11-27 MED ORDER — GUAIFENESIN ER 600 MG PO TB12
600.0000 mg | ORAL_TABLET | Freq: Two times a day (BID) | ORAL | Status: DC
  Administered 2020-11-27: 600 mg via ORAL
  Filled 2020-11-27: qty 1

## 2020-11-27 MED ORDER — IPRATROPIUM-ALBUTEROL 20-100 MCG/ACT IN AERS: 1.0000 | INHALATION_SPRAY | Freq: Four times a day (QID) | RESPIRATORY_TRACT | Status: DC

## 2020-11-27 MED ORDER — GUAIFENESIN ER 600 MG PO TB12: 600.0000 mg | ORAL_TABLET | Freq: Two times a day (BID) | ORAL | 0 refills | Status: DC

## 2020-11-27 MED ORDER — IPRATROPIUM-ALBUTEROL 0.5-2.5 (3) MG/3ML IN SOLN: 3.0000 mL | Freq: Four times a day (QID) | RESPIRATORY_TRACT | Status: DC

## 2020-11-27 MED ORDER — IPRATROPIUM-ALBUTEROL 20-100 MCG/ACT IN AERS: 1.0000 | INHALATION_SPRAY | Freq: Four times a day (QID) | RESPIRATORY_TRACT | 0 refills | Status: DC

## 2020-11-27 NOTE — Discharge Summary (Addendum)
Physician Discharge Summary  [name] [name]:[id number] DOB: 1940/08/12 DOA: 11/24/2020  PCP: Jerrol Banana., MD  Admit date: 11/24/2020 Discharge date: 11/27/2020   Please refer to Discharge summary perform by Dr Verlon Au for detailed.  See updated Medication list. Added Guaifenesin and albuterol for COPD.  Patient stable for discharge.   Discharge Diagnoses:  Principal Problem:   Weakness of both legs Active Problems:   HYPERTENSION, BENIGN   COPD (chronic obstructive pulmonary disease) with emphysema (HCC)   CAD (coronary artery disease)   Back ache   Late effects of cerebrovascular disease   Diabetes mellitus with polyneuropathy (HCC)   Type 2 diabetes mellitus with stage 3a chronic kidney disease (HCC)   History of anemia due to CKD   Fall at home, initial encounter   Syncope and collapse Orthostatic.    Discharge Instructions  Discharge Instructions     Diet - low sodium heart healthy   Complete by: As directed    Increase activity slowly   Complete by: As directed       Allergies as of 11/27/2020       Reactions   Coconut Fatty Acids Swelling, Other (See Comments)   Throat swells        Medication List     STOP taking these medications    Accu-Chek FastClix Lancets Misc   Accu-Chek Guide test strip Generic drug: glucose blood   Accu-Chek Guide w/Device Kit   albuterol 108 (90 Base) MCG/ACT inhaler Commonly known as: VENTOLIN HFA   cephALEXin 500 MG capsule Commonly known as: KEFLEX   Comfeel Plus Ulcer Dressing Pads   doxycycline 100 MG tablet Commonly known as: VIBRA-TABS   gabapentin 600 MG tablet Commonly known as: NEURONTIN Replaced by: gabapentin 100 MG capsule   ipratropium-albuterol 0.5-2.5 (3) MG/3ML Soln Commonly known as: DUONEB Replaced by: Ipratropium-Albuterol 20-100 MCG/ACT Aers respimat   isosorbide mononitrate 60 MG 24 hr tablet Commonly known as: IMDUR   mometasone 0.1 % lotion Commonly known as: ELOCON    nitroGLYCERIN 0.4 MG SL tablet Commonly known as: NITROSTAT   predniSONE 20 MG tablet Commonly known as: DELTASONE   protective barrier Crea       TAKE these medications    amLODipine 5 MG tablet Commonly known as: NORVASC TAKE 1 TABLET BY MOUTH EVERY DAY   aspirin 81 MG EC tablet Take 1 tablet (81 mg total) by mouth daily.   atorvastatin 40 MG tablet Commonly known as: LIPITOR TAKE 1 TABLET BY MOUTH  DAILY AT 6 PM   clopidogrel 75 MG tablet Commonly known as: PLAVIX TAKE 1 TABLET BY MOUTH  DAILY   donepezil 5 MG tablet Commonly known as: ARICEPT TAKE 1 TABLET BY MOUTH EVERYDAY AT BEDTIME   gabapentin 100 MG capsule Commonly known as: NEURONTIN Take 1 capsule (100 mg total) by mouth 2 (two) times daily. Replaces: gabapentin 600 MG tablet   guaiFENesin 600 MG 12 hr tablet Commonly known as: MUCINEX Take 1 tablet (600 mg total) by mouth 2 (two) times daily.   Ipratropium-Albuterol 20-100 MCG/ACT Aers respimat Commonly known as: COMBIVENT Inhale 1 puff into the lungs every 6 (six) hours. Replaces: ipratropium-albuterol 0.5-2.5 (3) MG/3ML Soln   magnesium oxide 400 (241.3 Mg) MG tablet Commonly known as: MAG-OX Take 400 mg by mouth 2 (two) times daily.   metoprolol succinate 50 MG 24 hr tablet Commonly known as: TOPROL-XL Take 1 tablet (50 mg total) by mouth daily. Take with or immediately following a meal. What  changed:  medication strength how much to take additional instructions   multivitamin tablet Take 0.5 tablets by mouth 2 (two) times daily.   pantoprazole 40 MG tablet Commonly known as: PROTONIX TAKE 1 TABLET BY MOUTH TWICE A DAY   VITAMIN D PO Take by mouth daily.   vitamin E 180 MG (400 UNITS) capsule Take 400 Units by mouth daily.   zinc sulfate 220 (50 Zn) MG capsule Take 220 mg by mouth daily.        Allergies  Allergen Reactions   Coconut Fatty Acids Swelling and Other (See Comments)    Throat swells     Consultations: None   Procedures/Studies: DG Chest 1 View  Result Date: 11/24/2020 CLINICAL DATA:  [place] at home today. Left hip pain. History of COPD. EXAM: CHEST  1 VIEW COMPARISON:  11/18/2020. FINDINGS: Cardiac silhouette is normal in size. No mediastinal or hilar masses. Interstitial thickening in the lower lungs, most evident extending from the inferior right hilum with evidence of bronchial wall thickening. These findings are similar to the prior exam. No lung consolidation. No evidence of pulmonary edema. No convincing pleural effusion and no pneumothorax. Skeletal structures are grossly intact. IMPRESSION: 1. No acute cardiopulmonary disease. 2. Chronic bronchitic changes most evident in the right lung base. Electronically Signed   By: Lajean Manes M.D.   On: 11/24/2020 12:47   DG Chest 2 View  Result Date: 11/18/2020 CLINICAL DATA:  Weakness EXAM: CHEST - 2 VIEW COMPARISON:  07/31/2020 FINDINGS: Cardiac shadow is stable. Lungs are well aerated bilaterally. No focal infiltrate or sizable effusion is seen. No bony abnormality is noted. Postsurgical changes in the cervical spine are seen. IMPRESSION: No active cardiopulmonary disease. Electronically Signed   By: Inez Catalina M.D.   On: 11/18/2020 17:41   CT HEAD WO CONTRAST (5MM)  Result Date: 11/24/2020 CLINICAL DATA:  Facial trauma; Neck trauma (Age >= 65y) EXAM: CT HEAD WITHOUT CONTRAST CT MAXILLOFACIAL WITHOUT CONTRAST CT CERVICAL SPINE WITHOUT CONTRAST TECHNIQUE: Multidetector CT imaging of the head, cervical spine, and maxillofacial structures were performed using the standard protocol without intravenous contrast. Multiplanar CT image reconstructions of the cervical spine and maxillofacial structures were also generated. COMPARISON:  Head CT 07/31/2020, cervical spine CT 05/06/2018 FINDINGS: CT HEAD FINDINGS Brain: No evidence of acute intracranial hemorrhage or extra-axial collection.No evidence of mass lesion/concern mass  effect.The ventricles are unchanged in size.Scattered subcortical and periventricular white matter hypodensities, nonspecific but likely sequela of chronic small vessel ischemic disease.Mild cerebral atrophy Vascular: No hyperdense vessel or unexpected calcification. Skull: Normal. Negative for fracture or focal lesion. Other: None. CT MAXILLOFACIAL FINDINGS Osseous: No fracture or mandibular dislocation. No destructive process. Orbits: Prior bilateral lens surgeries. No traumatic or inflammatory finding. Sinuses: Mild mucosal thickening of the ethmoid air cells and sphenoid sinuses. Soft tissues: Negative CT CERVICAL SPINE FINDINGS Alignment: Normal. Skull base and vertebrae: Prior ACDF at C5-C6. Intact hardware without evidence of loosening. There is no evidence of acute cervical spine fracture. There is mild bilateral facet arthropathy. Mild degenerative disc disease at C4-C5 and C6-C7. Soft tissues and spinal canal: No prevertebral fluid or swelling. No visible canal hematoma. Disc levels: Preserved disc spaces.No large disc herniation, significant spinal canal or neuroforaminal narrowing. Upper chest: Paraseptal emphysema in the lung apices. Other: 1.3 cm right posterior thyroid nodule, unchanged from prior chest CT and does not require follow-up. IMPRESSION: No acute intracranial abnormality.  No acute facial fracture. No acute cervical spine fracture. Prior ACDF at C5-C6 without  evidence of hardware complication. Electronically Signed   By: Maurine Simmering M.D.   On: 11/24/2020 14:13   CT Cervical Spine Wo Contrast  Result Date: 11/24/2020 CLINICAL DATA:  Facial trauma; Neck trauma (Age >= 65y) EXAM: CT HEAD WITHOUT CONTRAST CT MAXILLOFACIAL WITHOUT CONTRAST CT CERVICAL SPINE WITHOUT CONTRAST TECHNIQUE: Multidetector CT imaging of the head, cervical spine, and maxillofacial structures were performed using the standard protocol without intravenous contrast. Multiplanar CT image reconstructions of the cervical  spine and maxillofacial structures were also generated. COMPARISON:  Head CT 07/31/2020, cervical spine CT 05/06/2018 FINDINGS: CT HEAD FINDINGS Brain: No evidence of acute intracranial hemorrhage or extra-axial collection.No evidence of mass lesion/concern mass effect.The ventricles are unchanged in size.Scattered subcortical and periventricular white matter hypodensities, nonspecific but likely sequela of chronic small vessel ischemic disease.Mild cerebral atrophy Vascular: No hyperdense vessel or unexpected calcification. Skull: Normal. Negative for fracture or focal lesion. Other: None. CT MAXILLOFACIAL FINDINGS Osseous: No fracture or mandibular dislocation. No destructive process. Orbits: Prior bilateral lens surgeries. No traumatic or inflammatory finding. Sinuses: Mild mucosal thickening of the ethmoid air cells and sphenoid sinuses. Soft tissues: Negative CT CERVICAL SPINE FINDINGS Alignment: Normal. Skull base and vertebrae: Prior ACDF at C5-C6. Intact hardware without evidence of loosening. There is no evidence of acute cervical spine fracture. There is mild bilateral facet arthropathy. Mild degenerative disc disease at C4-C5 and C6-C7. Soft tissues and spinal canal: No prevertebral fluid or swelling. No visible canal hematoma. Disc levels: Preserved disc spaces.No large disc herniation, significant spinal canal or neuroforaminal narrowing. Upper chest: Paraseptal emphysema in the lung apices. Other: 1.3 cm right posterior thyroid nodule, unchanged from prior chest CT and does not require follow-up. IMPRESSION: No acute intracranial abnormality.  No acute facial fracture. No acute cervical spine fracture. Prior ACDF at C5-C6 without evidence of hardware complication. Electronically Signed   By: Maurine Simmering M.D.   On: 11/24/2020 14:13   DG Hip Unilat W or Wo Pelvis 2-3 Views Left  Result Date: 11/24/2020 CLINICAL DATA:  [place] at home today.  Left hip pain. EXAM: DG HIP (WITH OR WITHOUT PELVIS) 2-3V LEFT  COMPARISON:  None. FINDINGS: No convincing fracture.  No bone lesion. Hip joints, SI joints and symphysis pubis are normally spaced and aligned. Skeletal structures are demineralized. Soft tissues are unremarkable other than arterial atherosclerotic calcifications. IMPRESSION: 1. No fracture or dislocation. Electronically Signed   By: Lajean Manes M.D.   On: 11/24/2020 12:48   CT Maxillofacial Wo Contrast  Result Date: 11/24/2020 CLINICAL DATA:  Facial trauma; Neck trauma (Age >= 65y) EXAM: CT HEAD WITHOUT CONTRAST CT MAXILLOFACIAL WITHOUT CONTRAST CT CERVICAL SPINE WITHOUT CONTRAST TECHNIQUE: Multidetector CT imaging of the head, cervical spine, and maxillofacial structures were performed using the standard protocol without intravenous contrast. Multiplanar CT image reconstructions of the cervical spine and maxillofacial structures were also generated. COMPARISON:  Head CT 07/31/2020, cervical spine CT 05/06/2018 FINDINGS: CT HEAD FINDINGS Brain: No evidence of acute intracranial hemorrhage or extra-axial collection.No evidence of mass lesion/concern mass effect.The ventricles are unchanged in size.Scattered subcortical and periventricular white matter hypodensities, nonspecific but likely sequela of chronic small vessel ischemic disease.Mild cerebral atrophy Vascular: No hyperdense vessel or unexpected calcification. Skull: Normal. Negative for fracture or focal lesion. Other: None. CT MAXILLOFACIAL FINDINGS Osseous: No fracture or mandibular dislocation. No destructive process. Orbits: Prior bilateral lens surgeries. No traumatic or inflammatory finding. Sinuses: Mild mucosal thickening of the ethmoid air cells and sphenoid sinuses. Soft tissues: Negative CT CERVICAL SPINE  FINDINGS Alignment: Normal. Skull base and vertebrae: Prior ACDF at C5-C6. Intact hardware without evidence of loosening. There is no evidence of acute cervical spine fracture. There is mild bilateral facet arthropathy. Mild degenerative  disc disease at C4-C5 and C6-C7. Soft tissues and spinal canal: No prevertebral fluid or swelling. No visible canal hematoma. Disc levels: Preserved disc spaces.No large disc herniation, significant spinal canal or neuroforaminal narrowing. Upper chest: Paraseptal emphysema in the lung apices. Other: 1.3 cm right posterior thyroid nodule, unchanged from prior chest CT and does not require follow-up. IMPRESSION: No acute intracranial abnormality.  No acute facial fracture. No acute cervical spine fracture. Prior ACDF at C5-C6 without evidence of hardware complication. Electronically Signed   By: Maurine Simmering M.D.   On: 11/24/2020 14:13     Subjective: She is breathing at baseline, has productive cough, sporadic wheezing.  Will add albuterol and  Guaifenesin.   Discharge Exam: Vitals:   11/27/20 0410 11/27/20 0723  BP: (!) 151/55 (!) 144/61  Pulse: 70 71  Resp: 15 17  Temp: 98.4 F (36.9 C) 98.8 F (37.1 C)  SpO2: 97% 97%     General: Pt is alert, awake, not in acute distress Cardiovascular: RRR, S1/S2 +, no rubs, no gallops Respiratory: CTA bilaterally, no wheezing, no rhonchi Abdominal: Soft, NT, ND, bowel sounds + Extremities: no edema, no cyanosis    The results of significant diagnostics from this hospitalization (including imaging, microbiology, ancillary and laboratory) are listed below for reference.     Microbiology: Recent Results (from the past 240 hour(s))  Resp Panel by RT-PCR (Flu A&B, Covid) Nasopharyngeal Swab     Status: None   Collection Time: 11/18/20  4:42 PM   Specimen: Nasopharyngeal Swab; Nasopharyngeal(NP) swabs in vial transport medium  Result Value Ref Range Status   SARS Coronavirus 2 by RT PCR NEGATIVE NEGATIVE Final    Comment: (NOTE) SARS-CoV-2 target nucleic acids are NOT DETECTED.  The SARS-CoV-2 RNA is generally detectable in upper respiratory specimens during the acute phase of infection. The lowest concentration of SARS-CoV-2 viral copies this  assay can detect is 138 copies/mL. A negative result does not preclude SARS-Cov-2 infection and should not be used as the sole basis for treatment or other patient management decisions. A negative result may occur with  improper specimen collection/handling, submission of specimen other than nasopharyngeal swab, presence of viral mutation(s) within the areas targeted by this assay, and inadequate number of viral copies(<138 copies/mL). A negative result must be combined with clinical observations, patient history, and epidemiological information. The expected result is Negative.  Fact Sheet for Patients:  EntrepreneurPulse.com.au  Fact Sheet for Healthcare Providers:  IncredibleEmployment.be  This test is no t yet approved or cleared by the [place] FDA and  has been authorized for detection and/or diagnosis of SARS-CoV-2 by FDA under an Emergency Use Authorization (EUA). This EUA will remain  in effect (meaning this test can be used) for the duration of the COVID-19 declaration under Section 564(b)(1) of the Act, 21 U.S.C.section 360bbb-3(b)(1), unless the authorization is terminated  or revoked sooner.       Influenza A by PCR NEGATIVE NEGATIVE Final   Influenza B by PCR NEGATIVE NEGATIVE Final    Comment: (NOTE) The Xpert Xpress SARS-CoV-2/FLU/RSV plus assay is intended as an aid in the diagnosis of influenza from Nasopharyngeal swab specimens and should not be used as a sole basis for treatment. Nasal washings and aspirates are unacceptable for Xpert Xpress SARS-CoV-2/FLU/RSV testing.  Fact Sheet  for Patients: EntrepreneurPulse.com.au  Fact Sheet for Healthcare Providers: IncredibleEmployment.be  This test is not yet approved or cleared by the [place] FDA and has been authorized for detection and/or diagnosis of SARS-CoV-2 by FDA under an Emergency Use Authorization (EUA). This EUA will  remain in effect (meaning this test can be used) for the duration of the COVID-19 declaration under Section 564(b)(1) of the Act, 21 U.S.C. section 360bbb-3(b)(1), unless the authorization is terminated or revoked.  Performed at [facility], [address] [place], [place] [address]   Urine Culture     Status: Abnormal   Collection Time: 11/18/20  4:42 PM   Specimen: Urine, Clean Catch  Result Value Ref Range Status   Specimen Description   Final    URINE, CLEAN CATCH Performed at [facility], [address] [place], [place] [address]    Special Requests   Final    NONE Performed at [facility], [place], [place] [address]    Culture >=100,000 COLONIES/mL ESCHERICHIA COLI (A)  Final   Report Status 11/21/2020 FINAL  Final   Organism ID, Bacteria ESCHERICHIA COLI (A)  Final      Susceptibility   Escherichia coli - MIC*    AMPICILLIN 8 SENSITIVE Sensitive     CEFAZOLIN <=4 SENSITIVE Sensitive     CEFEPIME <=0.12 SENSITIVE Sensitive     CEFTRIAXONE <=0.25 SENSITIVE Sensitive     CIPROFLOXACIN <=0.25 SENSITIVE Sensitive     GENTAMICIN <=1 SENSITIVE Sensitive     IMIPENEM <=0.25 SENSITIVE Sensitive     NITROFURANTOIN <=16 SENSITIVE Sensitive     TRIMETH/SULFA <=20 SENSITIVE Sensitive     AMPICILLIN/SULBACTAM 4 SENSITIVE Sensitive     PIP/TAZO <=4 SENSITIVE Sensitive     * >=100,000 COLONIES/mL ESCHERICHIA COLI  Resp Panel by RT-PCR (Flu A&B, Covid) Nasopharyngeal Swab     Status: None   Collection Time: 11/24/20 11:46 AM   Specimen: Nasopharyngeal Swab; Nasopharyngeal(NP) swabs in vial transport medium  Result Value Ref Range Status   SARS Coronavirus 2 by RT PCR NEGATIVE NEGATIVE Final    Comment: (NOTE) SARS-CoV-2 target nucleic acids are NOT DETECTED.  The SARS-CoV-2 RNA is generally detectable in upper respiratory specimens during the acute phase of infection. The lowest concentration of SARS-CoV-2 viral copies this  assay can detect is 138 copies/mL. A negative result does not preclude SARS-Cov-2 infection and should not be used as the sole basis for treatment or other patient management decisions. A negative result may occur with  improper specimen collection/handling, submission of specimen other than nasopharyngeal swab, presence of viral mutation(s) within the areas targeted by this assay, and inadequate number of viral copies(<138 copies/mL). A negative result must be combined with clinical observations, patient history, and epidemiological information. The expected result is Negative.  Fact Sheet for Patients:  EntrepreneurPulse.com.au  Fact Sheet for Healthcare Providers:  IncredibleEmployment.be  This test is no t yet approved or cleared by the [place] FDA and  has been authorized for detection and/or diagnosis of SARS-CoV-2 by FDA under an Emergency Use Authorization (EUA). This EUA will remain  in effect (meaning this test can be used) for the duration of the COVID-19 declaration under Section 564(b)(1) of the Act, 21 U.S.C.section 360bbb-3(b)(1), unless the authorization is terminated  or revoked sooner.       Influenza A by PCR NEGATIVE NEGATIVE Final   Influenza B by PCR NEGATIVE NEGATIVE Final    Comment: (NOTE) The Xpert Xpress SARS-CoV-2/FLU/RSV plus  assay is intended as an aid in the diagnosis of influenza from Nasopharyngeal swab specimens and should not be used as a sole basis for treatment. Nasal washings and aspirates are unacceptable for Xpert Xpress SARS-CoV-2/FLU/RSV testing.  Fact Sheet for Patients: EntrepreneurPulse.com.au  Fact Sheet for Healthcare Providers: IncredibleEmployment.be  This test is not yet approved or cleared by the [place] FDA and has been authorized for detection and/or diagnosis of SARS-CoV-2 by FDA under an Emergency Use Authorization (EUA). This EUA will  remain in effect (meaning this test can be used) for the duration of the COVID-19 declaration under Section 564(b)(1) of the Act, 21 U.S.C. section 360bbb-3(b)(1), unless the authorization is terminated or revoked.  Performed at [facility], [place] [address], [place], [place] [address]   SARS CORONAVIRUS 2 (TAT 6-24 HRS) Nasopharyngeal Nasopharyngeal Swab     Status: None   Collection Time: 11/26/20  4:30 PM   Specimen: Nasopharyngeal Swab  Result Value Ref Range Status   SARS Coronavirus 2 NEGATIVE NEGATIVE Final    Comment: (NOTE) SARS-CoV-2 target nucleic acids are NOT DETECTED.  The SARS-CoV-2 RNA is generally detectable in upper and lower respiratory specimens during the acute phase of infection. Negative results do not preclude SARS-CoV-2 infection, do not rule out co-infections with other pathogens, and should not be used as the sole basis for treatment or other patient management decisions. Negative results must be combined with clinical observations, patient history, and epidemiological information. The expected result is Negative.  Fact Sheet for Patients: SugarRoll.be  Fact Sheet for Healthcare Providers: https://www.woods-mathews.com/  This test is not yet approved or cleared by the [place] FDA and  has been authorized for detection and/or diagnosis of SARS-CoV-2 by FDA under an Emergency Use Authorization (EUA). This EUA will remain  in effect (meaning this test can be used) for the duration of the COVID-19 declaration under Se ction 564(b)(1) of the Act, 21 U.S.C. section 360bbb-3(b)(1), unless the authorization is terminated or revoked sooner.  Performed at [facility], [place] [address], [place], [place] [address]      Labs: BNP (last 3 results) No results for input(s): BNP in the last 8760 hours. Basic Metabolic Panel: Recent Labs  Lab 11/24/20 1143 11/24/20 2156 11/25/20 0128 11/26/20 0154   NA 141  --  141 140  K 5.0  --  4.6 4.2  CL 96*  --  99 98  CO2 39*  --  28 33*  GLUCOSE 115*  --  79 84  BUN 23  --  21 14  CREATININE 1.41* 1.26* 1.13* 0.97  CALCIUM 9.0  --  8.8* 8.6*  MG 2.8*  --   --   --    Liver Function Tests: Recent Labs  Lab 11/24/20 1143 11/26/20 0154  AST 20 17  ALT 20 16  ALKPHOS 72 62  BILITOT 0.6 0.7  PROT 7.1 6.0*  ALBUMIN 3.1* 2.7*   No results for input(s): LIPASE, AMYLASE in the last 168 hours. No results for input(s): AMMONIA in the last 168 hours. CBC: Recent Labs  Lab 11/24/20 1615 11/24/20 2156 11/26/20 0154  WBC 6.5 5.8 5.9  NEUTROABS 5.2  --  4.4  HGB 10.4* 10.0* 10.0*  HCT 33.9* 32.9* 31.5*  MCV 100.0 100.6* 98.1  PLT 249 240 224   Cardiac Enzymes: Recent Labs  Lab 11/24/20 1143  CKTOTAL 44   BNP: Invalid input(s): POCBNP CBG: Recent Labs  Lab 11/26/20 0757 11/26/20 1101 11/26/20 1524 11/26/20  2106 11/27/20 0645  GLUCAP 96 94 90 130* 78   D-Dimer No results for input(s): DDIMER in the last 72 hours. Hgb A1c Recent Labs    11/24/20 2156  HGBA1C 6.6*   Lipid Profile No results for input(s): CHOL, HDL, LDLCALC, TRIG, CHOLHDL, LDLDIRECT in the last 72 hours. Thyroid function studies Recent Labs    11/24/20 2156  TSH 3.353   Anemia work up No results for input(s): VITAMINB12, FOLATE, FERRITIN, TIBC, IRON, RETICCTPCT in the last 72 hours. Urinalysis    Component Value Date/Time   COLORURINE YELLOW 11/24/2020 1143   APPEARANCEUR HAZY (A) 11/24/2020 1143   APPEARANCEUR Clear 04/20/2019 0000   LABSPEC 1.019 11/24/2020 1143   PHURINE 7.0 11/24/2020 1143   GLUCOSEU NEGATIVE 11/24/2020 1143   HGBUR NEGATIVE 11/24/2020 1143   BILIRUBINUR NEGATIVE 11/24/2020 1143   BILIRUBINUR Negative 09/12/2019 1559   KETONESUR NEGATIVE 11/24/2020 1143   PROTEINUR 100 (A) 11/24/2020 1143   UROBILINOGEN 0.2 09/12/2019 1559   NITRITE NEGATIVE 11/24/2020 1143   LEUKOCYTESUR NEGATIVE 11/24/2020 1143   Sepsis  Labs Invalid input(s): PROCALCITONIN,  WBC,  LACTICIDVEN Microbiology Recent Results (from the past 240 hour(s))  Resp Panel by RT-PCR (Flu A&B, Covid) Nasopharyngeal Swab     Status: None   Collection Time: 11/18/20  4:42 PM   Specimen: Nasopharyngeal Swab; Nasopharyngeal(NP) swabs in vial transport medium  Result Value Ref Range Status   SARS Coronavirus 2 by RT PCR NEGATIVE NEGATIVE Final    Comment: (NOTE) SARS-CoV-2 target nucleic acids are NOT DETECTED.  The SARS-CoV-2 RNA is generally detectable in upper respiratory specimens during the acute phase of infection. The lowest concentration of SARS-CoV-2 viral copies this assay can detect is 138 copies/mL. A negative result does not preclude SARS-Cov-2 infection and should not be used as the sole basis for treatment or other patient management decisions. A negative result may occur with  improper specimen collection/handling, submission of specimen other than nasopharyngeal swab, presence of viral mutation(s) within the areas targeted by this assay, and inadequate number of viral copies(<138 copies/mL). A negative result must be combined with clinical observations, patient history, and epidemiological information. The expected result is Negative.  Fact Sheet for Patients:  EntrepreneurPulse.com.au  Fact Sheet for Healthcare Providers:  IncredibleEmployment.be  This test is no t yet approved or cleared by the [place] FDA and  has been authorized for detection and/or diagnosis of SARS-CoV-2 by FDA under an Emergency Use Authorization (EUA). This EUA will remain  in effect (meaning this test can be used) for the duration of the COVID-19 declaration under Section 564(b)(1) of the Act, 21 U.S.C.section 360bbb-3(b)(1), unless the authorization is terminated  or revoked sooner.       Influenza A by PCR NEGATIVE NEGATIVE Final   Influenza B by PCR NEGATIVE NEGATIVE Final    Comment:  (NOTE) The Xpert Xpress SARS-CoV-2/FLU/RSV plus assay is intended as an aid in the diagnosis of influenza from Nasopharyngeal swab specimens and should not be used as a sole basis for treatment. Nasal washings and aspirates are unacceptable for Xpert Xpress SARS-CoV-2/FLU/RSV testing.  Fact Sheet for Patients: EntrepreneurPulse.com.au  Fact Sheet for Healthcare Providers: IncredibleEmployment.be  This test is not yet approved or cleared by the [place] FDA and has been authorized for detection and/or diagnosis of SARS-CoV-2 by FDA under an Emergency Use Authorization (EUA). This EUA will remain in effect (meaning this test can be used) for the duration of the COVID-19 declaration under Section 564(b)(1) of the Act,  21 U.S.C. section 360bbb-3(b)(1), unless the authorization is terminated or revoked.  Performed at [facility], [address] [place], [place] [address]   Urine Culture     Status: Abnormal   Collection Time: 11/18/20  4:42 PM   Specimen: Urine, Clean Catch  Result Value Ref Range Status   Specimen Description   Final    URINE, CLEAN CATCH Performed at [facility], [address] [place], [place] [address]    Special Requests   Final    NONE Performed at [facility], [place], [place] [address]    Culture >=100,000 COLONIES/mL ESCHERICHIA COLI (A)  Final   Report Status 11/21/2020 FINAL  Final   Organism ID, Bacteria ESCHERICHIA COLI (A)  Final      Susceptibility   Escherichia coli - MIC*    AMPICILLIN 8 SENSITIVE Sensitive     CEFAZOLIN <=4 SENSITIVE Sensitive     CEFEPIME <=0.12 SENSITIVE Sensitive     CEFTRIAXONE <=0.25 SENSITIVE Sensitive     CIPROFLOXACIN <=0.25 SENSITIVE Sensitive     GENTAMICIN <=1 SENSITIVE Sensitive     IMIPENEM <=0.25 SENSITIVE Sensitive     NITROFURANTOIN <=16 SENSITIVE Sensitive     TRIMETH/SULFA <=20 SENSITIVE Sensitive      AMPICILLIN/SULBACTAM 4 SENSITIVE Sensitive     PIP/TAZO <=4 SENSITIVE Sensitive     * >=100,000 COLONIES/mL ESCHERICHIA COLI  Resp Panel by RT-PCR (Flu A&B, Covid) Nasopharyngeal Swab     Status: None   Collection Time: 11/24/20 11:46 AM   Specimen: Nasopharyngeal Swab; Nasopharyngeal(NP) swabs in vial transport medium  Result Value Ref Range Status   SARS Coronavirus 2 by RT PCR NEGATIVE NEGATIVE Final    Comment: (NOTE) SARS-CoV-2 target nucleic acids are NOT DETECTED.  The SARS-CoV-2 RNA is generally detectable in upper respiratory specimens during the acute phase of infection. The lowest concentration of SARS-CoV-2 viral copies this assay can detect is 138 copies/mL. A negative result does not preclude SARS-Cov-2 infection and should not be used as the sole basis for treatment or other patient management decisions. A negative result may occur with  improper specimen collection/handling, submission of specimen other than nasopharyngeal swab, presence of viral mutation(s) within the areas targeted by this assay, and inadequate number of viral copies(<138 copies/mL). A negative result must be combined with clinical observations, patient history, and epidemiological information. The expected result is Negative.  Fact Sheet for Patients:  EntrepreneurPulse.com.au  Fact Sheet for Healthcare Providers:  IncredibleEmployment.be  This test is no t yet approved or cleared by the [place] FDA and  has been authorized for detection and/or diagnosis of SARS-CoV-2 by FDA under an Emergency Use Authorization (EUA). This EUA will remain  in effect (meaning this test can be used) for the duration of the COVID-19 declaration under Section 564(b)(1) of the Act, 21 U.S.C.section 360bbb-3(b)(1), unless the authorization is terminated  or revoked sooner.       Influenza A by PCR NEGATIVE NEGATIVE Final   Influenza B by PCR NEGATIVE NEGATIVE Final     Comment: (NOTE) The Xpert Xpress SARS-CoV-2/FLU/RSV plus assay is intended as an aid in the diagnosis of influenza from Nasopharyngeal swab specimens and should not be used as a sole basis for treatment. Nasal washings and aspirates are unacceptable for Xpert Xpress SARS-CoV-2/FLU/RSV testing.  Fact Sheet for Patients: EntrepreneurPulse.com.au  Fact Sheet for Healthcare Providers: IncredibleEmployment.be  This test is not yet approved or cleared by the [place] FDA and has been  authorized for detection and/or diagnosis of SARS-CoV-2 by FDA under an Emergency Use Authorization (EUA). This EUA will remain in effect (meaning this test can be used) for the duration of the COVID-19 declaration under Section 564(b)(1) of the Act, 21 U.S.C. section 360bbb-3(b)(1), unless the authorization is terminated or revoked.  Performed at [facility], [place] [address] [place], [place] [address]   SARS CORONAVIRUS 2 (TAT 6-24 HRS) Nasopharyngeal Nasopharyngeal Swab     Status: None   Collection Time: 11/26/20  4:30 PM   Specimen: Nasopharyngeal Swab  Result Value Ref Range Status   SARS Coronavirus 2 NEGATIVE NEGATIVE Final    Comment: (NOTE) SARS-CoV-2 target nucleic acids are NOT DETECTED.  The SARS-CoV-2 RNA is generally detectable in upper and lower respiratory specimens during the acute phase of infection. Negative results do not preclude SARS-CoV-2 infection, do not rule out co-infections with other pathogens, and should not be used as the sole basis for treatment or other patient management decisions. Negative results must be combined with clinical observations, patient history, and epidemiological information. The expected result is Negative.  Fact Sheet for Patients: SugarRoll.be  Fact Sheet for Healthcare Providers: https://www.woods-mathews.com/  This test is not yet approved or cleared by the  [place] FDA and  has been authorized for detection and/or diagnosis of SARS-CoV-2 by FDA under an Emergency Use Authorization (EUA). This EUA will remain  in effect (meaning this test can be used) for the duration of the COVID-19 declaration under Se ction 564(b)(1) of the Act, 21 U.S.C. section 360bbb-3(b)(1), unless the authorization is terminated or revoked sooner.  Performed at [facility], [place] [address], [place], [place] [address]      Time coordinating discharge: 40 minutes  SIGNED:   Elmarie Shiley, MD  [facility]

## 2020-11-27 NOTE — Plan of Care (Signed)

## 2020-11-27 NOTE — Progress Notes (Signed)
Occupational Therapy Treatment Patient Details Name: [name] [name] MRN: [id number] DOB: Apr 10, 1940 Today's Date: 11/27/2020    History of present illness [name] [name] is a 80 y.o. female who presented to the emergency department with progressive generalized weakness over the past week with a fall.  Patient has chronic debility and has been wheelchair-bound for years, but able to stand to assist with changing positions with the help of her husband. medical history significant for COPD with chronic respiratory failure on 3 L/min oxygen at home, lung cancer 4 years ago in remission,Coronary artery disease, hypertension, diabetes mellitus, hyperlipidemia, CVA, dementia, & chronic debility.   OT comments  [name] is progressing incrementally this session, pt's husband present and supportive however reported he is concerned with his ability to assist the pt at her current functional level. Pt completed Adls while sitting EOB this session with increased time and effort and fair balance. She required increased assist to transfer from bed>chair, and presented with BLE shaking when exerting effort. Pt continues to benefit from OT acutely. D/c recommendation remains appropriate.    Follow Up Recommendations  SNF    Equipment Recommendations  None recommended by OT       Precautions / Restrictions Precautions Precautions: Fall Precaution Comments: 3L O2 att times Restrictions Weight Bearing Restrictions: No       Mobility Bed Mobility Overal bed mobility: Needs Assistance Bed Mobility: Supine to Sit     Supine to sit: Min assist;HOB elevated     General bed mobility comments: min A to elevate trunk, increased time and effort    Transfers Overall transfer level: Needs assistance Equipment used: Rolling walker (2 wheeled) Transfers: Sit to/from [facility] Sit to Stand: Mod assist Stand pivot transfers: Max assist       General transfer comment: mod A for  sit<>stand, once standing with RW pt unable to lift or pivot bilat feet towards the chair and ultimately requried max A for stand pivot transfer from bed>chair    Balance Overall balance assessment: Needs assistance Sitting-balance support: Feet supported Sitting balance-Leahy Scale: Fair     Standing balance support: Bilateral upper extremity supported Standing balance-Leahy Scale: Poor         ADL either performed or assessed with clinical judgement   ADL Overall ADL's : Needs assistance/impaired                 Upper Body Dressing : Set up;Sitting Upper Body Dressing Details (indicate cue type and reason): pt donned dress while sitting EOB with fair sitting EOB balance                 Functional mobility during ADLs: Moderate assistance;Cueing for safety;Cueing for sequencing General ADL Comments: Session focused on ADLs and simple transfers to prepare for d/c      Cognition Arousal/Alertness: Awake/alert Behavior During Therapy: WFL for tasks assessed/performed Overall Cognitive Status: No family/caregiver present to determine baseline cognitive functioning               General Comments: Pt with slow processing, requries simple one step commands and incrased time for cue following; hx of dementia              General Comments VSS on 3L nasal canual, husband present and supportive throughout session    Pertinent Vitals/ Pain       Pain Assessment: Faces Faces Pain Scale: Hurts a little bit Pain Intervention(s): Limited activity within patient's tolerance;Monitored during session   Frequency  Min 2X/week        Progress Toward Goals  OT Goals(current goals can now be found in the care plan section)  Progress towards OT goals: Progressing toward goals  Acute Rehab OT Goals Patient Stated Goal: home soon OT Goal Formulation: With patient Time For Goal Achievement: 12/09/20 Potential to Achieve Goals: Fair ADL Goals Pt Will Perform  Lower Body Bathing: with min guard assist;sit to/from stand Pt Will Perform Lower Body Dressing: with min guard assist;sit to/from stand Pt Will Transfer to Toilet: with min guard assist;stand pivot transfer;bedside commode Pt Will Perform Tub/Shower Transfer: with min assist;shower seat;rolling walker Pt/caregiver will Perform Home Exercise Program: Increased strength;Both right and left upper extremity;With theraband;With Supervision;With written HEP provided  Plan Discharge plan remains appropriate       AM-PAC OT "6 Clicks" Daily Activity     Outcome Measure   Help from another person eating meals?: A Little Help from another person taking care of personal grooming?: A Little Help from another person toileting, which includes using toliet, bedpan, or urinal?: A Lot Help from another person bathing (including washing, rinsing, drying)?: A Lot Help from another person to put on and taking off regular upper body clothing?: A Little Help from another person to put on and taking off regular lower body clothing?: A Lot 6 Click Score: 15    End of Session Equipment Utilized During Treatment: Gait belt;Oxygen  OT Visit Diagnosis: Unsteadiness on feet (R26.81);Other abnormalities of gait and mobility (R26.89);Muscle weakness (generalized) (M62.81);History of falling (Z91.81);Pain   Activity Tolerance Patient tolerated treatment well   Patient Left in chair;with call bell/phone within reach;with chair alarm set   Nurse Communication Mobility status;Precautions;Weight bearing status        Time: 1425-1440 OT Time Calculation (min): 15 min  Charges: OT General Charges $OT Visit: 1 Visit OT Treatments $Self Care/Home Management : 8-22 mins    [name] A Jazyiah Yiu 11/27/2020, 3:42 PM

## 2020-11-27 NOTE — TOC Transition Note (Signed)
Transition of Care [facility]) - CM/SW Discharge Note   Patient Details  Name: [name] [name] MRN: [id number] Date of Birth: 03-19-1941  [facility] CM/SW Contact:  Milinda Antis, [place] Phone Number: 11/27/2020, 12:34 PM   Clinical Narrative:    Patient will DC to: [facility] Anticipated DC date: 11/27/2020 Family notified: YES Transport by: Corey [name]   Per MD patient ready for DC to SNF . RN to call report prior to discharge [phone] room 216. RN, patient, patient's family, and facility notified of DC. Patient's family did not answer.  CSW left VM for the patient's spouse and patient's daughter.  Discharge Summary and FL2 sent to facility. DC packet on chart. Ambulance transport requested for patient.   CSW will sign off for now as social work intervention is no longer needed. Please consult [place] again if new needs arise.     Final next level of care: Skilled Nursing Facility Barriers to Discharge: Barriers Resolved   Patient Goals and CMS Choice Patient states their goals for this hospitalization and ongoing recovery are:: To get back home CMS Medicare.gov Compare Post Acute Care list provided to:: Patient Choice offered to / list presented to : Patient, Spouse  Discharge Placement              Patient chooses bed at:  [facility]) Patient to be transferred to facility by: [facility] Name of family member notified: [name],[name] (Spouse)   [phone] Patient and family notified of of transfer: 11/27/20  Discharge Plan and Services                                     Social Determinants of Health (SDOH) Interventions     Readmission Risk Interventions No flowsheet data found.

## 2020-11-27 NOTE — Consult Note (Signed)
   [facility] CM Inpatient Consult   11/27/2020  [name] [name] 05/27/1940 [id number]  [facility] [AC[facility]] Patient: [name]  Primary Care Provider:  Jerrol Banana., MD, an Embedded provider at [facility]   Patient was screened for [facility] services. Patient will have the transition of care call conducted by the primary care provider. This patient is also in an Embedded practice which has a chronic disease management Embedded Care Management team.  Patient has had outreaches with [facility]iative program noted.  Plan: Patient is noted to transition to SNF today.  Needs to be met at a skilled nursing facility for [facility] needs.   Please contact for further questions,  [name], RN BSN [facility] Liaison  [phone] business mobile phone Toll free office [phone]  Fax number: [phone] [email or web address]

## 2020-11-28 ENCOUNTER — Encounter: Payer: Self-pay | Admitting: Adult Health

## 2020-11-28 ENCOUNTER — Non-Acute Institutional Stay (SKILLED_NURSING_FACILITY): Payer: Medicare Other | Admitting: Adult Health

## 2020-11-28 DIAGNOSIS — J432 Centrilobular emphysema: Secondary | ICD-10-CM | POA: Diagnosis not present

## 2020-11-28 DIAGNOSIS — K5901 Slow transit constipation: Secondary | ICD-10-CM | POA: Diagnosis not present

## 2020-11-28 DIAGNOSIS — I10 Essential (primary) hypertension: Secondary | ICD-10-CM

## 2020-11-28 DIAGNOSIS — J9601 Acute respiratory failure with hypoxia: Secondary | ICD-10-CM | POA: Diagnosis not present

## 2020-11-28 DIAGNOSIS — I2510 Atherosclerotic heart disease of native coronary artery without angina pectoris: Secondary | ICD-10-CM | POA: Diagnosis not present

## 2020-11-28 DIAGNOSIS — R29898 Other symptoms and signs involving the musculoskeletal system: Secondary | ICD-10-CM

## 2020-11-28 DIAGNOSIS — E1140 Type 2 diabetes mellitus with diabetic neuropathy, unspecified: Secondary | ICD-10-CM

## 2020-11-28 NOTE — Progress Notes (Signed)
Location:  [place] Room Number: 216-A Place of Service:  SNF (31) Provider:  Durenda Age, DNP, FNP-BC  Patient Care Team: Jerrol Banana., MD as PCP - General (Unknown Physician Specialty) [name], MD as PCP - Cardiology (Cardiology) Lucky Cowboy, Erskine Squibb, MD as Consulting Physician (Vascular Surgery) Pa, [place] as Consulting Physician (Optometry) [name], Kathlene November, MD as Consulting Physician (Oncology) [name], MD as Referring Physician (Radiation Oncology) Murlean Iba, MD as Consulting Physician (Internal Medicine) Isaias Sakai, MD as Referring Physician (Ophthalmology)  Extended Emergency Contact Information Primary Emergency Contact: Sheller,[name] Address: [address]          [place] Phone: [phone] Mobile Phone: [phone] Relation: Spouse Secondary Emergency Contact: Merlene Laughter [place] Phone: [phone] Mobile Phone: [phone] Relation: Daughter  Code Status:  FULL CODE  Goals of care: Advanced Directive information Advanced Directives 11/28/2020  Does Patient Have a Medical Advance Directive? No  Type of Advance Directive -  Does patient want to make changes to medical advance directive? -  Copy of [place] in Chart? -  Would patient like information on creating a medical advance directive? No - Patient declined     Chief Complaint  Patient presents with   Hospitalization Follow-up    Hospital Follow Up.    HPI:  Pt is a 80 y.o. female who was admitted to [facility] on 11/27/20 post hospital admission 11/24/2020 to 11/27/2020.  She has a PMH of COPD with chronic respiratory failure on 3 L/minute oxygen at home, lung cancer 4 years ago in remission, coronary artery disease, hypertension, diabetes mellitus, hyperlipidemia, CVA, dementia, chronic debility and wheelchair-bound  for years.  She presented to the ED with progressive generalized weakness and has fallen on the morning of hospital admission due to knee buckling.  She denies dizziness, headache or loss of consciousness.  She has been wheelchair-bound for years but she was able to participate in changing her position with the help of her husband.  She hit her head on the side and slipped down on the floor.  According to her husband, they have had multiple home physical therapist on numerous occasions wherein she will get better but then relapsed back into weakness.  She has a daughter who sometimes help with her care but daughter is also sick with multiple sclerosis.  Of note, she was recently seen at an urgent care in [facility] where she was treated for urinary tract infection with Keflex.  In the ED, EKG did not show any rhythm abnormality.  CT of the head and neck were negative.  Left hip and pelvic x-ray were negative.  Chest x-ray was negative except for chronic bronchogenic changes particularly in the right lung base.  She was seen in the room today.  She complained of constipation.  Her O2 sat briefly dropped down to the 80s during physical therapy while on O2 at 3 L/minute.  Past Medical History:  Diagnosis Date   Abnormal CT lung screening 10/17/2015   COPD (chronic obstructive pulmonary disease) (HCC)    Coronary artery disease, non-occlusive    a. cath 2006: min nonobs CAD; b. cath 12/2010: cath LAD 50%, RCA 60%; c. 08/2013: Minimal luminal irregs, right dominant system with no significant CAD, diffuse luminal irregs noted. Normal EF 55%, no AS or MS.    Diabetes mellitus    Hyperlipemia  Followed by Dr. Rosanna Randy   Hypertension    Lung cancer [facility])    Macular degeneration    rt   Personal history of tobacco use, presenting hazards to health 10/15/2015   Stroke [facility])    Past Surgical History:  Procedure Laterality Date   ABDOMINAL HYSTERECTOMY     ANTERIOR CERVICAL DECOMP/DISCECTOMY  FUSION N/A 10/19/2013   Procedure: CERVICAL FIVE-SIX ANTERIOR CERVICAL DECOMPRESSION WITH FUSION INTERBODY PROSTHESIS PLATING AND PEEK CAGE;  Surgeon: Ophelia Charter, MD;  Location: [place] NEURO ORS;  Service: Neurosurgery;  Laterality: N/A;   BACK SURGERY  80's   BREAST CYST EXCISION Left    left negative    CARDIAC CATHETERIZATION  05/2004   CATARACT EXTRACTION Left    CHOLECYSTECTOMY     COLONOSCOPY N/A 12/29/2018   Procedure: COLONOSCOPY;  Surgeon: Toledo, Benay Pike, MD;  Location: [facility] ENDOSCOPY;  Service: Gastroenterology;  Laterality: N/A;   ENDARTERECTOMY Left 10/24/2014   Procedure: ENDARTERECTOMY CAROTID;  Surgeon: Algernon Huxley, MD;  Location: [facility] ORS;  Service: Vascular;  Laterality: Left;   ENDOBRONCHIAL ULTRASOUND N/A 11/14/2015   Procedure: ENDOBRONCHIAL ULTRASOUND;  Surgeon: [name], MD;  Location: [facility] ORS;  Service: Cardiopulmonary;  Laterality: N/A;   PERIPHERAL VASCULAR CATHETERIZATION N/A 12/04/2015   Procedure: Glori Luis Cath Insertion;  Surgeon: Algernon Huxley, MD;  Location: [place] CV LAB;  Service: Cardiovascular;  Laterality: N/A;   PORTA CATH REMOVAL N/A 05/17/2017   Procedure: PORTA CATH REMOVAL;  Surgeon: Algernon Huxley, MD;  Location: [place] CV LAB;  Service: Cardiovascular;  Laterality: N/A;   TONSILLECTOMY AND ADENOIDECTOMY     VESICOVAGINAL FISTULA CLOSURE W/ TAH      Allergies  Allergen Reactions   Coconut Fatty Acids Swelling and Other (See Comments)    Throat swells    Outpatient Encounter Medications as of 11/28/2020  Medication Sig   amLODipine (NORVASC) 5 MG tablet TAKE 1 TABLET BY MOUTH EVERY DAY   aspirin EC 81 MG EC tablet Take 1 tablet (81 mg total) by mouth daily.   atorvastatin (LIPITOR) 40 MG tablet TAKE 1 TABLET BY MOUTH  DAILY AT 6 PM   bisacodyl (DULCOLAX) 10 MG suppository Place 10 mg rectally as needed for moderate constipation.   clopidogrel (PLAVIX) 75 MG tablet TAKE 1 TABLET BY MOUTH  DAILY   donepezil (ARICEPT) 5 MG tablet TAKE  1 TABLET BY MOUTH EVERYDAY AT BEDTIME   gabapentin (NEURONTIN) 100 MG capsule Take 1 capsule (100 mg total) by mouth 2 (two) times daily.   guaiFENesin (MUCINEX) 600 MG 12 hr tablet Take 1 tablet (600 mg total) by mouth 2 (two) times daily.   Ipratropium-Albuterol (COMBIVENT) 20-100 MCG/ACT AERS respimat Inhale 1 puff into the lungs every 6 (six) hours.   Magnesium Hydroxide (MILK OF MAGNESIA PO) Take 30 mLs by mouth as needed.   magnesium oxide (MAG-OX) 400 (241.3 MG) MG tablet Take 400 mg by mouth 2 (two) times daily.    metoprolol succinate (TOPROL-XL) 50 MG 24 hr tablet Take 1 tablet (50 mg total) by mouth daily. Take with or immediately following a meal.   Multiple Vitamin (MULTIVITAMIN) tablet Take 0.5 tablets by mouth 2 (two) times daily.   pantoprazole (PROTONIX) 40 MG tablet TAKE 1 TABLET BY MOUTH TWICE A DAY   Sodium Phosphates (RA SALINE ENEMA RE) Place rectally as needed.   VITAMIN D PO Take by mouth daily.    vitamin E 180 MG (400 UNITS) capsule Take 400 Units by mouth daily.  zinc sulfate 220 (50 Zn) MG capsule Take 220 mg by mouth daily.   Facility-Administered Encounter Medications as of 11/28/2020  Medication   ondansetron (ZOFRAN) 8 mg, dexamethasone (DECADRON) 10 mg in sodium chloride 0.9 % 50 mL IVPB    Review of Systems  GENERAL: No change in appetite, no fatigue, no weight changes, no fever or chills  MOUTH and THROAT: Denies oral discomfort, gingival pain or bleeding RESPIRATORY: no cough, SOB, DOE, wheezing, hemoptysis CARDIAC: No chest pain, edema or palpitations GI: No abdominal pain, diarrhea, constipation, heart burn, nausea or vomiting GU: Denies dysuria, frequency, hematuria or discharge PSYCHIATRIC: Denies feelings of depression or anxiety. No report of hallucinations, insomnia, paranoia, or agitation   Immunization History  Administered Date(s) Administered   Fluad Quad(high Dose 65+) 12/30/2018, 03/14/2020   Influenza Split 01/17/2010, 01/05/2012,  02/29/2012   Influenza Whole 12/05/2009, 12/10/2010   Influenza, High Dose Seasonal PF 02/14/2014, 01/01/2015, 12/29/2016, 01/25/2018   Influenza,inj,Quad PF,6+ Mos 01/04/2013, 02/27/2013, 03/19/2016   Influenza-Unspecified 02/04/2014   PFIZER(Purple Top)SARS-COV-2 Vaccination 05/29/2019, 06/20/2019, 04/10/2020   Pneumococcal Conjugate-13 02/29/2012   Pneumococcal Polysaccharide-23 01/05/2008, 01/05/2012   Tdap 01/05/2012   Pertinent  Health Maintenance Due  Topic Date Due   URINE MICROALBUMIN  12/29/2017   FOOT EXAM  05/24/2018   INFLUENZA VACCINE  11/04/2020   HEMOGLOBIN A1C  05/27/2021   OPHTHALMOLOGY EXAM  07/16/2021   DEXA SCAN  01/20/2022   PNA vac Low Risk Adult  Completed   Fall Risk  06/13/2020 06/08/2019 06/07/2018 06/09/2017 05/31/2017  Falls in the past year? 0 1 1 Yes Yes  Number falls in past yr: 0 1 1 2  or more 2 or more  Injury with Fall? 0 0 0 - No  Comment - - - - -  Risk Factor Category  - - - High Fall Risk High Fall Risk  Risk for fall due to : - Impaired balance/gait;Impaired mobility Impaired mobility;Impaired balance/gait - Impaired balance/gait  Risk for fall due to: Comment - - - - -  Follow up - Falls prevention discussed Falls prevention discussed - Falls prevention discussed     Vitals:   11/28/20 1012  BP: 107/62  Pulse: 69  Resp: (!) 21  Temp: (!) 97.5 F (36.4 C)  Height: 5\' 4"  (1.626 m)   Body mass index is 25.66 kg/m.  Physical Exam  GENERAL APPEARANCE: Well nourished. In no acute distress. Normal body habitus SKIN:  Skin is warm and dry.  MOUTH and THROAT: Lips are without lesions. Oral mucosa is moist and without lesions.  RESPIRATORY: Breathing is even & unlabored, BS CTAB CARDIAC: RRR, no murmur,no extra heart sounds, no edema GI: Abdomen soft, normal BS, no masses, no tenderness NEUROLOGICAL: There is no tremor. Speech is clear.Alert and oriented X 3. PSYCHIATRIC:  Affect and behavior are appropriate  Labs reviewed: Recent Labs     08/01/20 0434 08/02/20 0715 11/24/20 1143 11/24/20 2156 11/25/20 0128 11/26/20 0154  NA 142   < > 141  --  141 140  K 4.0   < > 5.0  --  4.6 4.2  CL 98   < > 96*  --  99 98  CO2 34*   < > 39*  --  28 33*  GLUCOSE 65*   < > 115*  --  79 84  BUN 18   < > 23  --  21 14  CREATININE 0.76   < > 1.41* 1.26* 1.13* 0.97  CALCIUM 8.5*   < >  9.0  --  8.8* 8.6*  MG 2.2  --  2.8*  --   --   --   PHOS 3.1  --   --   --   --   --    < > = values in this interval not displayed.   Recent Labs    11/18/20 1259 11/24/20 1143 11/26/20 0154  AST 18 20 17   ALT 18 20 16   ALKPHOS 71 72 62  BILITOT 0.8 0.6 0.7  PROT 7.3 7.1 6.0*  ALBUMIN 3.5 3.1* 2.7*   Recent Labs    08/01/20 0434 11/18/20 1259 11/24/20 1615 11/24/20 2156 11/26/20 0154  WBC 4.5   < > 6.5 5.8 5.9  NEUTROABS 3.3  --  5.2  --  4.4  HGB 10.5*   < > 10.4* 10.0* 10.0*  HCT 32.9*   < > 33.9* 32.9* 31.5*  MCV 95.1   < > 100.0 100.6* 98.1  PLT 163   < > 249 240 224   < > = values in this interval not displayed.   Lab Results  Component Value Date   TSH 3.353 11/24/2020   Lab Results  Component Value Date   HGBA1C 6.6 (H) 11/24/2020   Lab Results  Component Value Date   CHOL 149 06/07/2019   HDL 57 06/07/2019   LDLCALC 84 06/07/2019   TRIG 41 06/07/2019   CHOLHDL 2.6 06/07/2019    Significant Diagnostic Results in last 30 days:  DG Chest 1 View  Result Date: 11/24/2020 CLINICAL DATA:  [place] at home today. Left hip pain. History of COPD. EXAM: CHEST  1 VIEW COMPARISON:  11/18/2020. FINDINGS: Cardiac silhouette is normal in size. No mediastinal or hilar masses. Interstitial thickening in the lower lungs, most evident extending from the inferior right hilum with evidence of bronchial wall thickening. These findings are similar to the prior exam. No lung consolidation. No evidence of pulmonary edema. No convincing pleural effusion and no pneumothorax. Skeletal structures are grossly intact. IMPRESSION: 1. No acute  cardiopulmonary disease. 2. Chronic bronchitic changes most evident in the right lung base. Electronically Signed   By: Lajean Manes M.D.   On: 11/24/2020 12:47   DG Chest 2 View  Result Date: 11/18/2020 CLINICAL DATA:  Weakness EXAM: CHEST - 2 VIEW COMPARISON:  07/31/2020 FINDINGS: Cardiac shadow is stable. Lungs are well aerated bilaterally. No focal infiltrate or sizable effusion is seen. No bony abnormality is noted. Postsurgical changes in the cervical spine are seen. IMPRESSION: No active cardiopulmonary disease. Electronically Signed   By: Inez Catalina M.D.   On: 11/18/2020 17:41   CT HEAD WO CONTRAST (5MM)  Result Date: 11/24/2020 CLINICAL DATA:  Facial trauma; Neck trauma (Age >= 65y) EXAM: CT HEAD WITHOUT CONTRAST CT MAXILLOFACIAL WITHOUT CONTRAST CT CERVICAL SPINE WITHOUT CONTRAST TECHNIQUE: Multidetector CT imaging of the head, cervical spine, and maxillofacial structures were performed using the standard protocol without intravenous contrast. Multiplanar CT image reconstructions of the cervical spine and maxillofacial structures were also generated. COMPARISON:  Head CT 07/31/2020, cervical spine CT 05/06/2018 FINDINGS: CT HEAD FINDINGS Brain: No evidence of acute intracranial hemorrhage or extra-axial collection.No evidence of mass lesion/concern mass effect.The ventricles are unchanged in size.Scattered subcortical and periventricular white matter hypodensities, nonspecific but likely sequela of chronic small vessel ischemic disease.Mild cerebral atrophy Vascular: No hyperdense vessel or unexpected calcification. Skull: Normal. Negative for fracture or focal lesion. Other: None. CT MAXILLOFACIAL FINDINGS Osseous: No fracture or mandibular dislocation. No destructive process. Orbits: Prior  bilateral lens surgeries. No traumatic or inflammatory finding. Sinuses: Mild mucosal thickening of the ethmoid air cells and sphenoid sinuses. Soft tissues: Negative CT CERVICAL SPINE FINDINGS Alignment:  Normal. Skull base and vertebrae: Prior ACDF at C5-C6. Intact hardware without evidence of loosening. There is no evidence of acute cervical spine fracture. There is mild bilateral facet arthropathy. Mild degenerative disc disease at C4-C5 and C6-C7. Soft tissues and spinal canal: No prevertebral fluid or swelling. No visible canal hematoma. Disc levels: Preserved disc spaces.No large disc herniation, significant spinal canal or neuroforaminal narrowing. Upper chest: Paraseptal emphysema in the lung apices. Other: 1.3 cm right posterior thyroid nodule, unchanged from prior chest CT and does not require follow-up. IMPRESSION: No acute intracranial abnormality.  No acute facial fracture. No acute cervical spine fracture. Prior ACDF at C5-C6 without evidence of hardware complication. Electronically Signed   By: Maurine Simmering M.D.   On: 11/24/2020 14:13   CT Cervical Spine Wo Contrast  Result Date: 11/24/2020 CLINICAL DATA:  Facial trauma; Neck trauma (Age >= 65y) EXAM: CT HEAD WITHOUT CONTRAST CT MAXILLOFACIAL WITHOUT CONTRAST CT CERVICAL SPINE WITHOUT CONTRAST TECHNIQUE: Multidetector CT imaging of the head, cervical spine, and maxillofacial structures were performed using the standard protocol without intravenous contrast. Multiplanar CT image reconstructions of the cervical spine and maxillofacial structures were also generated. COMPARISON:  Head CT 07/31/2020, cervical spine CT 05/06/2018 FINDINGS: CT HEAD FINDINGS Brain: No evidence of acute intracranial hemorrhage or extra-axial collection.No evidence of mass lesion/concern mass effect.The ventricles are unchanged in size.Scattered subcortical and periventricular white matter hypodensities, nonspecific but likely sequela of chronic small vessel ischemic disease.Mild cerebral atrophy Vascular: No hyperdense vessel or unexpected calcification. Skull: Normal. Negative for fracture or focal lesion. Other: None. CT MAXILLOFACIAL FINDINGS Osseous: No fracture or  mandibular dislocation. No destructive process. Orbits: Prior bilateral lens surgeries. No traumatic or inflammatory finding. Sinuses: Mild mucosal thickening of the ethmoid air cells and sphenoid sinuses. Soft tissues: Negative CT CERVICAL SPINE FINDINGS Alignment: Normal. Skull base and vertebrae: Prior ACDF at C5-C6. Intact hardware without evidence of loosening. There is no evidence of acute cervical spine fracture. There is mild bilateral facet arthropathy. Mild degenerative disc disease at C4-C5 and C6-C7. Soft tissues and spinal canal: No prevertebral fluid or swelling. No visible canal hematoma. Disc levels: Preserved disc spaces.No large disc herniation, significant spinal canal or neuroforaminal narrowing. Upper chest: Paraseptal emphysema in the lung apices. Other: 1.3 cm right posterior thyroid nodule, unchanged from prior chest CT and does not require follow-up. IMPRESSION: No acute intracranial abnormality.  No acute facial fracture. No acute cervical spine fracture. Prior ACDF at C5-C6 without evidence of hardware complication. Electronically Signed   By: Maurine Simmering M.D.   On: 11/24/2020 14:13   DG Hip Unilat W or Wo Pelvis 2-3 Views Left  Result Date: 11/24/2020 CLINICAL DATA:  [place] at home today.  Left hip pain. EXAM: DG HIP (WITH OR WITHOUT PELVIS) 2-3V LEFT COMPARISON:  None. FINDINGS: No convincing fracture.  No bone lesion. Hip joints, SI joints and symphysis pubis are normally spaced and aligned. Skeletal structures are demineralized. Soft tissues are unremarkable other than arterial atherosclerotic calcifications. IMPRESSION: 1. No fracture or dislocation. Electronically Signed   By: Lajean Manes M.D.   On: 11/24/2020 12:48   CT Maxillofacial Wo Contrast  Result Date: 11/24/2020 CLINICAL DATA:  Facial trauma; Neck trauma (Age >= 65y) EXAM: CT HEAD WITHOUT CONTRAST CT MAXILLOFACIAL WITHOUT CONTRAST CT CERVICAL SPINE WITHOUT CONTRAST TECHNIQUE: Multidetector CT imaging of  the head,  cervical spine, and maxillofacial structures were performed using the standard protocol without intravenous contrast. Multiplanar CT image reconstructions of the cervical spine and maxillofacial structures were also generated. COMPARISON:  Head CT 07/31/2020, cervical spine CT 05/06/2018 FINDINGS: CT HEAD FINDINGS Brain: No evidence of acute intracranial hemorrhage or extra-axial collection.No evidence of mass lesion/concern mass effect.The ventricles are unchanged in size.Scattered subcortical and periventricular white matter hypodensities, nonspecific but likely sequela of chronic small vessel ischemic disease.Mild cerebral atrophy Vascular: No hyperdense vessel or unexpected calcification. Skull: Normal. Negative for fracture or focal lesion. Other: None. CT MAXILLOFACIAL FINDINGS Osseous: No fracture or mandibular dislocation. No destructive process. Orbits: Prior bilateral lens surgeries. No traumatic or inflammatory finding. Sinuses: Mild mucosal thickening of the ethmoid air cells and sphenoid sinuses. Soft tissues: Negative CT CERVICAL SPINE FINDINGS Alignment: Normal. Skull base and vertebrae: Prior ACDF at C5-C6. Intact hardware without evidence of loosening. There is no evidence of acute cervical spine fracture. There is mild bilateral facet arthropathy. Mild degenerative disc disease at C4-C5 and C6-C7. Soft tissues and spinal canal: No prevertebral fluid or swelling. No visible canal hematoma. Disc levels: Preserved disc spaces.No large disc herniation, significant spinal canal or neuroforaminal narrowing. Upper chest: Paraseptal emphysema in the lung apices. Other: 1.3 cm right posterior thyroid nodule, unchanged from prior chest CT and does not require follow-up. IMPRESSION: No acute intracranial abnormality.  No acute facial fracture. No acute cervical spine fracture. Prior ACDF at C5-C6 without evidence of hardware complication. Electronically Signed   By: Maurine Simmering M.D.   On: 11/24/2020 14:13     Assessment/Plan  1. Weakness of both legs -   will have PT and OT for therapeutic strengthening exercises -    fall precautions  2. Acute respiratory failure with hypoxia (HCC) -   Continue O2 at 3 L/minute via [place] continuously  3. CAD in native artery -   Continue aspirin, Plavix, and atorvastatin  4. Type 2 diabetes mellitus with diabetic neuropathy, without long-term current use of insulin (HCC) Lab Results  Component Value Date   HGBA1C 6.6 (H) 11/24/2020   -   Diet controlled, continue gabapentin  5. Centrilobular emphysema (HCC) -   Continue Combivent inhaler and guaifenesin ER  6.  Slow transit constipation -   Start senna S8 0.6/50 mg 2 tablets twice a day  7.  Benign hypertension -   Continue amlodipine and metoprolol succinate ER    Family/ staff Communication: Discussed plan of care with resident and charge nurse.  Labs/tests ordered:   Chest x-ray  Goals of care:   Short-term care   Durenda Age, DNP, MSN, FNP-BC [facility] [phone] (Monday-Friday 8:00 a.m. - 5:00 p.m.) [phone] (after hours)

## 2020-11-29 ENCOUNTER — Other Ambulatory Visit: Payer: Self-pay | Admitting: Family Medicine

## 2020-11-29 DIAGNOSIS — M79604 Pain in right leg: Secondary | ICD-10-CM

## 2020-11-29 NOTE — Telephone Encounter (Signed)
Requested medications are due for refill today no  Requested medications are on the active medication list Dose inconsistent with current med list  Last visit In [facility] facility currently, admission note this week.  Future visit scheduled no  Notes to clinic Dose inconsistent with current med list, please assess.

## 2020-12-02 ENCOUNTER — Non-Acute Institutional Stay (SKILLED_NURSING_FACILITY): Payer: Medicare Other | Admitting: Internal Medicine

## 2020-12-02 ENCOUNTER — Encounter: Payer: Self-pay | Admitting: Internal Medicine

## 2020-12-02 DIAGNOSIS — Z862 Personal history of diseases of the blood and blood-forming organs and certain disorders involving the immune mechanism: Secondary | ICD-10-CM

## 2020-12-02 DIAGNOSIS — N189 Chronic kidney disease, unspecified: Secondary | ICD-10-CM | POA: Diagnosis not present

## 2020-12-02 DIAGNOSIS — N179 Acute kidney failure, unspecified: Secondary | ICD-10-CM

## 2020-12-02 DIAGNOSIS — R55 Syncope and collapse: Secondary | ICD-10-CM | POA: Diagnosis not present

## 2020-12-02 DIAGNOSIS — E43 Unspecified severe protein-calorie malnutrition: Secondary | ICD-10-CM

## 2020-12-02 DIAGNOSIS — E1122 Type 2 diabetes mellitus with diabetic chronic kidney disease: Secondary | ICD-10-CM

## 2020-12-02 DIAGNOSIS — R29818 Other symptoms and signs involving the nervous system: Secondary | ICD-10-CM | POA: Diagnosis not present

## 2020-12-02 DIAGNOSIS — N183 Chronic kidney disease, stage 3 unspecified: Secondary | ICD-10-CM | POA: Diagnosis not present

## 2020-12-02 DIAGNOSIS — R4189 Other symptoms and signs involving cognitive functions and awareness: Secondary | ICD-10-CM

## 2020-12-02 NOTE — Progress Notes (Signed)
NURSING HOME LOCATION:  [facility] ROOM NUMBER:  [id number]  CODE STATUS:  Full  PCP:  Miguel Aschoff MD  This is a comprehensive admission note to this SNFperformed on this date less than 30 days from date of admission. Included are preadmission medical/surgical history; reconciled medication list; family history; social history and comprehensive review of systems.  Corrections and additions to the records were documented. Comprehensive physical exam was also performed. Additionally a clinical summary was entered for each active diagnosis pertinent to this admission in the Problem List to enhance continuity of care.  HPI: Patient was hospitalized 8/21 - 11/27/2020 for apparent orthostatic hypotension associated with fall.  She was seen in the [facility] ED 8/15 with several days of bilateral leg weakness and inability to ambulate.  She was given Rocephin in the ED for presumed UTI with transition to oral Keflex. She presented to the [facility] ED 8/21 after being found on the floor for at least 30 minutes.  She was alert only to self in the context of a history of dementia. Etiology of the fall was felt to be ongoing orthostasis.  She did receive IV saline over a 36-hour period.   Telemetry revealed rare PVCs.   Metroprolol was titrated down to 50 mg with continuation of amlodipine 5 mg daily. Nasal oxygen at 3 L/min and pulmonary toilet with DuoNeb every 4 hours as needed was continued for her COPD. The patient does have chronic left carotid stenosis and outpatient reevaluation was recommended with Dr. Lucky Cowboy. Keflex which had been prescribed for E. coli isolated on 8/15 was discontinued after additional 2 days in the hospital.  Past medical and surgical history: Includes COPD, nonocclusive CAD, dyslipidemia, hypertension, history of lung cancer, macular degeneration, history of stroke, and diabetes with neurovascular complications. Surgeries and procedures include abdominal hysterectomy,  cervical discectomy/fusion, back surgery, cholecystectomy, left endarterectomy, colonoscopy, and vesicovaginal fistula closure.  Social history: Nondrinker; former smoker with a 50-pack-year history.  Family history: Reviewed; noncontributory due to advanced age.  There is a strong family history of heart disease.   Review of systems: Clinical neurocognitive deficits made validity of responses questionable. She scored 9/15 on BIMS.  She stated that she simply fell at home as she did not have enough strength in her legs to stand.  She denied any cardiac or neurologic prodrome. She states that she was on a pill twice a day for diabetes at home.  Her husband states that fasting blood sugars range from 60-150.  They would be 350 after a meal.  There is no oral diabetic medication listed among her medications. Her husband states she does snore but there is no apnea.  She has a chronic, nonproductive cough. She has dysphagia intermittently without other GI symptoms. Her husband said that he cannot handle her at home if she not able to stand with minimal assistance.  Constitutional: No fever, significant weight change Eyes: No redness, discharge, pain, vision change ENT/mouth: No nasal congestion, purulent discharge, earache, change in hearing, sore throat  Cardiovascular: No chest pain, palpitations, paroxysmal nocturnal dyspnea, edema  Respiratory: No hemoptysis Gastrointestinal: No heartburn, abdominal pain, nausea /vomiting, rectal bleeding, melena, change in bowels Genitourinary: No dysuria, hematuria, pyuria, incontinence, nocturia Musculoskeletal: No joint stiffness, joint swelling Dermatologic: No rash, pruritus, change in appearance of skin Neurologic: No headache, syncope, seizures, numbness, tingling Psychiatric: No significant anxiety, depression, insomnia, anorexia Endocrine: No change in hair/skin/nails, excessive thirst, excessive hunger, excessive urination  Hematologic/lymphatic:  No significant bruising, lymphadenopathy,  abnormal bleeding Allergy/immunology: No itchy/watery eyes, significant sneezing, urticaria, angioedema  Physical exam:  Pertinent or positive findings: She appears chronically ill.  Hair is thin and disheveled.  Eyebrows are decreased laterally.  She has bilateral ptosis, greater on the left.  The lower lids are puffy.  She is wearing nasal oxygen.  She has complete dentures.  She exhibits a nonproductive rattly cough.  There are expiratory rhonchi over the upper airway.  No carotid bruit could be auscultated in that setting.  Breath sounds are decreased posteriorly.  Abdomen is protuberant.  She has trace edema at the ankles.  Pedal pulses are decreased.  There is subtle clubbing of the nailbeds.  She is weak to opposition in all extremities.  Limbs are atrophic and she has interosseous wasting.  There is extensive bruising over the dorsum of the hands, greater on the left.  General appearance:  no acute distress, increased work of breathing is present.   Lymphatic: No lymphadenopathy about the head, neck, axilla. Eyes: No conjunctival inflammation or lid edema is present. There is no scleral icterus. Ears:  External ear exam shows no significant lesions or deformities.   Nose:  External nasal examination shows no deformity or inflammation. Nasal mucosa are pink and moist without lesions, exudates Neck:  No thyromegaly, masses, tenderness noted.    Heart:  No gallop, murmur, click, rub.  Lungs: without wheezes, rales, rubs. Abdomen: Bowel sounds are normal.  Abdomen is soft and nontender with no organomegaly, hernias, masses. GU: Deferred  Extremities:  No cyanosis. Neurologic exam:  Balance, Rhomberg, finger to nose testing could not be completed due to clinical stat Skin: Warm & dry w/o tenting. No significant lesions or rash.  See clinical summary under each active problem in the Problem List with associated updated therapeutic plan

## 2020-12-02 NOTE — Assessment & Plan Note (Addendum)
Protein supplementation as per [facility] Formulary for albumin 2.7/ total protein 6 Limb atrophy and interosseous wasting are present.  She is weak to opposition in all extremities.

## 2020-12-02 NOTE — Assessment & Plan Note (Addendum)
Current H/H 10/31.5; prior H/H 11.2/36.2 Indices normochromic ,normocytic  PMH of B12 deficiency, level supranormal 04/10/20 @ 1130 Anemia slightly worse  No bleeding dyscrasias reported by Staff Monitor CBC with FOBT, iron panel if anemia progresses

## 2020-12-02 NOTE — Assessment & Plan Note (Signed)
DM with neuro ,renal & vascular complications Glucose range as IP: 65-115 Current A1c: 6.6% A1c goal :< 8% No medication indicated

## 2020-12-02 NOTE — Patient Instructions (Signed)
See assessment and plan under each diagnosis in the problem list and acutely for this visit 

## 2020-12-02 NOTE — Assessment & Plan Note (Signed)
8/21-8/24/22  Creatinine peaked @ 1.41 / GFR nadir 38 ; CKD Stage 3b Current creatinine 0.97/ GFR   ; CKD Stage 2/3a Medication List reviewed.Low dose gabapentin low risk.

## 2020-12-02 NOTE — Assessment & Plan Note (Signed)
PT/OT @ SNF Consider TED hose trial

## 2020-12-02 NOTE — Assessment & Plan Note (Signed)
In the context of history of stroke and significant peripheral vascular disease; vascular dementia is suspected.  She had COVID in April 2022; it is unlikely that "COVID brain fog" is playing a role here.

## 2020-12-05 ENCOUNTER — Non-Acute Institutional Stay (SKILLED_NURSING_FACILITY): Payer: Medicare Other | Admitting: Adult Health

## 2020-12-05 ENCOUNTER — Encounter: Payer: Self-pay | Admitting: Adult Health

## 2020-12-05 DIAGNOSIS — K2100 Gastro-esophageal reflux disease with esophagitis, without bleeding: Secondary | ICD-10-CM

## 2020-12-05 DIAGNOSIS — F0150 Vascular dementia without behavioral disturbance: Secondary | ICD-10-CM

## 2020-12-05 DIAGNOSIS — G629 Polyneuropathy, unspecified: Secondary | ICD-10-CM

## 2020-12-05 DIAGNOSIS — J432 Centrilobular emphysema: Secondary | ICD-10-CM | POA: Diagnosis not present

## 2020-12-05 DIAGNOSIS — R531 Weakness: Secondary | ICD-10-CM

## 2020-12-05 DIAGNOSIS — G3184 Mild cognitive impairment, so stated: Secondary | ICD-10-CM

## 2020-12-05 DIAGNOSIS — Z8719 Personal history of other diseases of the digestive system: Secondary | ICD-10-CM | POA: Diagnosis not present

## 2020-12-05 DIAGNOSIS — I10 Essential (primary) hypertension: Secondary | ICD-10-CM | POA: Diagnosis not present

## 2020-12-05 DIAGNOSIS — E1122 Type 2 diabetes mellitus with diabetic chronic kidney disease: Secondary | ICD-10-CM | POA: Diagnosis not present

## 2020-12-05 DIAGNOSIS — I2510 Atherosclerotic heart disease of native coronary artery without angina pectoris: Secondary | ICD-10-CM | POA: Diagnosis not present

## 2020-12-05 DIAGNOSIS — E782 Mixed hyperlipidemia: Secondary | ICD-10-CM

## 2020-12-05 DIAGNOSIS — N183 Chronic kidney disease, stage 3 unspecified: Secondary | ICD-10-CM | POA: Diagnosis not present

## 2020-12-05 NOTE — Progress Notes (Signed)
Location:  [facility] Room Number: 216-A Place of Service:  SNF (31) Provider:  Durenda Age, DNP, FNP-BC  Patient Care Team: Jerrol Banana., MD as PCP - General (Unknown Physician Specialty) [name], MD as PCP - Cardiology (Cardiology) Lucky Cowboy, Erskine Squibb, MD as Consulting Physician (Vascular Surgery) Pa, Gantt as Consulting Physician (Optometry) [name], Kathlene November, MD as Consulting Physician (Oncology) [name], MD as Referring Physician (Radiation Oncology) Murlean Iba, MD as Consulting Physician (Internal Medicine) Isaias Sakai, MD as Referring Physician (Ophthalmology)  Extended Emergency Contact Information Primary Emergency Contact: Prouty,[name] Address: [address]          [place] Phone: [phone] Mobile Phone: [phone] Relation: Spouse Secondary Emergency Contact: Merlene Laughter [place] Phone: [phone] Mobile Phone: [phone] Relation: Daughter  Code Status:  Full code  Goals of care: Advanced Directive information Advanced Directives 12/05/2020  Does Patient Have a Medical Advance Directive? No  Type of Advance Directive -  Does patient want to make changes to medical advance directive? -  Copy of [place] in Chart? -  Would patient like information on creating a medical advance directive? No - Patient declined     Chief Complaint  Patient presents with   Discharge Note    HPI:  [name] [name] is a 80 y.o. female who is for discharge home  on 12/06/20 with Home health [name] [name] and OT.  She was admitted to [facility] on 11/27/20 post hospitalization 11/24/2020 to 11/27/2020.  She has a PMH of COPD with chronic respiratory failure on 3 L O2 at home, lung cancer 4 years ago in remission, coronary artery disease, hypertension, diabetes mellitus, hyperlipidemia, CVA, dementia, chronic debility and  wheelchair-bound for years.  She presented to the ED with progressive generalized weakness and has fallen on the morning of hospital admission due to knee buckling.  She denies dizziness, headache or loss of consciousness.  She has been wheelchair-bound for years but she was able to participate in changing her position with the help of her husband.  She hit her head on the side and slipped down on the floor.  According to her husband, they have had multiple fall home physical therapist on numerous occasions wherein she will get better but then relapses back into weakness.  She has a daughter who sometimes help with her care but daughter is also sick with multiple sclerosis.  Of note, she was recently seen at an urgent care in [facility] where she was treated for urinary tract infection with Keflex.  In the ED, EKG did not show any rhythm abnormality.  CT of the head were negative.  Left hip and pelvic x-ray were negative.  Chest x-ray was negative except for chronic bronchogenic changes particularly in the right lung base.  Patient was admitted to this facility for short-term rehabilitation after the patient's recent hospitalization.  Patient has completed SNF rehabilitation and therapy has cleared the patient for discharge.   Past Medical History:  Diagnosis Date   Abnormal CT lung screening 10/17/2015   COPD (chronic obstructive pulmonary disease) (HCC)    Coronary artery disease, non-occlusive    a. cath 2006: min nonobs CAD; b. cath 12/2010: cath LAD 50%, RCA 60%; c. 08/2013: Minimal luminal irregs, right dominant system with no significant CAD, diffuse luminal irregs noted. Normal EF 55%, no AS or MS.    Diabetes mellitus  Hyperlipemia    Followed by Dr. Rosanna Randy   Hypertension    Lung cancer [facility]    Macular degeneration    rt   Personal history of tobacco use, presenting hazards to health 10/15/2015   Stroke [facility])    Past Surgical History:  Procedure Laterality Date    ABDOMINAL HYSTERECTOMY     ANTERIOR CERVICAL DECOMP/DISCECTOMY FUSION N/A 10/19/2013   Procedure: CERVICAL FIVE-SIX ANTERIOR CERVICAL DECOMPRESSION WITH FUSION INTERBODY PROSTHESIS PLATING AND PEEK CAGE;  Surgeon: Ophelia Charter, MD;  Location: [place] NEURO ORS;  Service: Neurosurgery;  Laterality: N/A;   BACK SURGERY  80's   BREAST CYST EXCISION Left    left negative    CARDIAC CATHETERIZATION  05/2004   CATARACT EXTRACTION Left    CHOLECYSTECTOMY     COLONOSCOPY N/A 12/29/2018   Procedure: COLONOSCOPY;  Surgeon: Toledo, Benay Pike, MD;  Location: [facility] ENDOSCOPY;  Service: Gastroenterology;  Laterality: N/A;   ENDARTERECTOMY Left 10/24/2014   Procedure: ENDARTERECTOMY CAROTID;  Surgeon: Algernon Huxley, MD;  Location: [facility] ORS;  Service: Vascular;  Laterality: Left;   ENDOBRONCHIAL ULTRASOUND N/A 11/14/2015   Procedure: ENDOBRONCHIAL ULTRASOUND;  Surgeon: [name], MD;  Location: [facility] ORS;  Service: Cardiopulmonary;  Laterality: N/A;   PERIPHERAL VASCULAR CATHETERIZATION N/A 12/04/2015   Procedure: Glori Luis Cath Insertion;  Surgeon: Algernon Huxley, MD;  Location: [place] CV LAB;  Service: Cardiovascular;  Laterality: N/A;   PORTA CATH REMOVAL N/A 05/17/2017   Procedure: PORTA CATH REMOVAL;  Surgeon: Algernon Huxley, MD;  Location: [place] CV LAB;  Service: Cardiovascular;  Laterality: N/A;   TONSILLECTOMY AND ADENOIDECTOMY     VESICOVAGINAL FISTULA CLOSURE W/ TAH      Allergies  Allergen Reactions   Coconut Fatty Acids Swelling and Other (See Comments)    Throat swells    Outpatient Encounter Medications as of 12/05/2020  Medication Sig   amLODipine (NORVASC) 5 MG tablet TAKE 1 TABLET BY MOUTH EVERY DAY   aspirin EC 81 MG EC tablet Take 1 tablet (81 mg total) by mouth daily.   atorvastatin (LIPITOR) 40 MG tablet TAKE 1 TABLET BY MOUTH  DAILY AT 6 PM   bisacodyl (DULCOLAX) 10 MG suppository Place 10 mg rectally as needed for moderate constipation.   clopidogrel (PLAVIX) 75 MG tablet TAKE 1  TABLET BY MOUTH  DAILY   donepezil (ARICEPT) 5 MG tablet TAKE 1 TABLET BY MOUTH EVERYDAY AT BEDTIME   gabapentin (NEURONTIN) 100 MG capsule Take 1 capsule (100 mg total) by mouth 2 (two) times daily.   guaiFENesin (MUCINEX) 600 MG 12 hr tablet Take 1 tablet (600 mg total) by mouth 2 (two) times daily.   Ipratropium-Albuterol (COMBIVENT) 20-100 MCG/ACT AERS respimat Inhale 1 puff into the lungs every 6 (six) hours.   Magnesium Hydroxide (MILK OF MAGNESIA PO) Take 30 mLs by mouth as needed.   magnesium oxide (MAG-OX) 400 (241.3 MG) MG tablet Take 400 mg by mouth 2 (two) times daily.    metoprolol succinate (TOPROL-XL) 50 MG 24 hr tablet Take 1 tablet (50 mg total) by mouth daily. Take with or immediately following a meal.   Multiple Vitamin (MULTIVITAMIN) tablet Take 0.5 tablets by mouth 2 (two) times daily.   pantoprazole (PROTONIX) 40 MG tablet TAKE 1 TABLET BY MOUTH TWICE A DAY   Sodium Phosphates (RA SALINE ENEMA RE) Place rectally as needed.   VITAMIN D PO Take by mouth daily.    vitamin E 180 MG (400 UNITS) capsule Take 400  Units by mouth daily.   zinc sulfate 220 (50 Zn) MG capsule Take 220 mg by mouth daily.   Facility-Administered Encounter Medications as of 12/05/2020  Medication   ondansetron (ZOFRAN) 8 mg, dexamethasone (DECADRON) 10 mg in sodium chloride 0.9 % 50 mL IVPB    Review of Systems  GENERAL: No change in appetite, no fatigue, no weight changes, no fever, chills or weakness MOUTH and THROAT: Denies oral discomfort, gingival pain or bleeding, pain from teeth or hoarseness   RESPIRATORY: no cough, SOB, DOE, wheezing, hemoptysis CARDIAC: No chest pain, edema or palpitations GI: No abdominal pain, diarrhea, constipation, heart burn, nausea or vomiting GU: Denies dysuria, frequency, hematuria or discharge NEUROLOGICAL: Denies dizziness, syncope, numbness, or headache PSYCHIATRIC: Denies feelings of depression or anxiety. No report of hallucinations, insomnia, paranoia, or  agitation   Immunization History  Administered Date(s) Administered   Fluad Quad(high Dose 65+) 12/30/2018, 03/14/2020   Influenza Split 01/17/2010, 01/05/2012, 02/29/2012   Influenza Whole 12/05/2009, 12/10/2010   Influenza, High Dose Seasonal PF 02/14/2014, 01/01/2015, 12/29/2016, 01/25/2018   Influenza,inj,Quad PF,6+ Mos 01/04/2013, 02/27/2013, 03/19/2016   Influenza-Unspecified 02/04/2014   PFIZER(Purple Top)SARS-COV-2 Vaccination 05/29/2019, 06/20/2019, 04/10/2020   Pneumococcal Conjugate-13 02/29/2012   Pneumococcal Polysaccharide-23 01/05/2008, 01/05/2012   Tdap 01/05/2012   Pertinent  Health Maintenance Due  Topic Date Due   URINE MICROALBUMIN  12/29/2017   FOOT EXAM  05/24/2018   INFLUENZA VACCINE  11/04/2020   HEMOGLOBIN A1C  05/27/2021   OPHTHALMOLOGY EXAM  07/16/2021   DEXA SCAN  01/20/2022   PNA vac Low Risk Adult  Completed   Fall Risk  06/13/2020 06/08/2019 06/07/2018 06/09/2017 05/31/2017  Falls in the past year? 0 1 1 Yes Yes  Number falls in past yr: 0 1 1 2  or more 2 or more  Injury with Fall? 0 0 0 - No  Comment - - - - -  Risk Factor Category  - - - High Fall Risk High Fall Risk  Risk for fall due to : - Impaired balance/gait;Impaired mobility Impaired mobility;Impaired balance/gait - Impaired balance/gait  Risk for fall due to: Comment - - - - -  Follow up - Falls prevention discussed Falls prevention discussed - Falls prevention discussed     Vitals:   12/05/20 1657  BP: 124/65  Pulse: (!) 59  Resp: 18  Temp: (!) 97.5 F (36.4 C)  Weight: 139 lb 12.8 oz (63.4 kg)  Height: 5\' 4"  (1.626 m)   Body mass index is 24 kg/m.  Physical Exam  GENERAL APPEARANCE: Well nourished. In no acute distress. Normal body habitus SKIN:  Skin is warm and dry.  MOUTH and THROAT: Lips are without lesions. Oral mucosa is moist and without lesions.  RESPIRATORY: Breathing is even & unlabored, BS CTAB CARDIAC: RRR, no murmur,no extra heart sounds, no edema GI: Abdomen  soft, normal BS, no masses, no tenderness, NEUROLOGICAL: There is no tremor. Speech is clear.  PSYCHIATRIC:  Affect and behavior are appropriate  Labs reviewed: Recent Labs    08/01/20 0434 08/02/20 0715 11/24/20 1143 11/24/20 2156 11/25/20 0128 11/26/20 0154  NA 142   < > 141  --  141 140  K 4.0   < > 5.0  --  4.6 4.2  CL 98   < > 96*  --  99 98  CO2 34*   < > 39*  --  28 33*  GLUCOSE 65*   < > 115*  --  79 84  BUN 18   < >  23  --  21 14  CREATININE 0.76   < > 1.41* 1.26* 1.13* 0.97  CALCIUM 8.5*   < > 9.0  --  8.8* 8.6*  MG 2.2  --  2.8*  --   --   --   PHOS 3.1  --   --   --   --   --    < > = values in this interval not displayed.   Recent Labs    11/18/20 1259 11/24/20 1143 11/26/20 0154  AST 18 20 17   ALT 18 20 16   ALKPHOS 71 72 62  BILITOT 0.8 0.6 0.7  PROT 7.3 7.1 6.0*  ALBUMIN 3.5 3.1* 2.7*   Recent Labs    08/01/20 0434 11/18/20 1259 11/24/20 1615 11/24/20 2156 11/26/20 0154  WBC 4.5   < > 6.5 5.8 5.9  NEUTROABS 3.3  --  5.2  --  4.4  HGB 10.5*   < > 10.4* 10.0* 10.0*  HCT 32.9*   < > 33.9* 32.9* 31.5*  MCV 95.1   < > 100.0 100.6* 98.1  PLT 163   < > 249 240 224   < > = values in this interval not displayed.   Lab Results  Component Value Date   TSH 3.353 11/24/2020   Lab Results  Component Value Date   HGBA1C 6.6 (H) 11/24/2020   Lab Results  Component Value Date   CHOL 149 06/07/2019   HDL 57 06/07/2019   LDLCALC 84 06/07/2019   TRIG 41 06/07/2019   CHOLHDL 2.6 06/07/2019    Significant Diagnostic Results in last 30 days:  DG Chest 1 View  Result Date: 11/24/2020 CLINICAL DATA:  [place] at home today. Left hip pain. History of COPD. EXAM: CHEST  1 VIEW COMPARISON:  11/18/2020. FINDINGS: Cardiac silhouette is normal in size. No mediastinal or hilar masses. Interstitial thickening in the lower lungs, most evident extending from the inferior right hilum with evidence of bronchial wall thickening. These findings are similar to the prior  exam. No lung consolidation. No evidence of pulmonary edema. No convincing pleural effusion and no pneumothorax. Skeletal structures are grossly intact. IMPRESSION: 1. No acute cardiopulmonary disease. 2. Chronic bronchitic changes most evident in the right lung base. Electronically Signed   By: Lajean Manes M.D.   On: 11/24/2020 12:47   DG Chest 2 View  Result Date: 11/18/2020 CLINICAL DATA:  Weakness EXAM: CHEST - 2 VIEW COMPARISON:  07/31/2020 FINDINGS: Cardiac shadow is stable. Lungs are well aerated bilaterally. No focal infiltrate or sizable effusion is seen. No bony abnormality is noted. Postsurgical changes in the cervical spine are seen. IMPRESSION: No active cardiopulmonary disease. Electronically Signed   By: Inez Catalina M.D.   On: 11/18/2020 17:41   CT HEAD WO CONTRAST (5MM)  Result Date: 11/24/2020 CLINICAL DATA:  Facial trauma; Neck trauma (Age >= 65y) EXAM: CT HEAD WITHOUT CONTRAST CT MAXILLOFACIAL WITHOUT CONTRAST CT CERVICAL SPINE WITHOUT CONTRAST TECHNIQUE: Multidetector CT imaging of the head, cervical spine, and maxillofacial structures were performed using the standard protocol without intravenous contrast. Multiplanar CT image reconstructions of the cervical spine and maxillofacial structures were also generated. COMPARISON:  Head CT 07/31/2020, cervical spine CT 05/06/2018 FINDINGS: CT HEAD FINDINGS Brain: No evidence of acute intracranial hemorrhage or extra-axial collection.No evidence of mass lesion/concern mass effect.The ventricles are unchanged in size.Scattered subcortical and periventricular white matter hypodensities, nonspecific but likely sequela of chronic small vessel ischemic disease.Mild cerebral atrophy Vascular: No hyperdense vessel or unexpected calcification.  Skull: Normal. Negative for fracture or focal lesion. Other: None. CT MAXILLOFACIAL FINDINGS Osseous: No fracture or mandibular dislocation. No destructive process. Orbits: Prior bilateral lens surgeries. No  traumatic or inflammatory finding. Sinuses: Mild mucosal thickening of the ethmoid air cells and sphenoid sinuses. Soft tissues: Negative CT CERVICAL SPINE FINDINGS Alignment: Normal. Skull base and vertebrae: Prior ACDF at C5-C6. Intact hardware without evidence of loosening. There is no evidence of acute cervical spine fracture. There is mild bilateral facet arthropathy. Mild degenerative disc disease at C4-C5 and C6-C7. Soft tissues and spinal canal: No prevertebral fluid or swelling. No visible canal hematoma. Disc levels: Preserved disc spaces.No large disc herniation, significant spinal canal or neuroforaminal narrowing. Upper chest: Paraseptal emphysema in the lung apices. Other: 1.3 cm right posterior thyroid nodule, unchanged from prior chest CT and does not require follow-up. IMPRESSION: No acute intracranial abnormality.  No acute facial fracture. No acute cervical spine fracture. Prior ACDF at C5-C6 without evidence of hardware complication. Electronically Signed   By: Maurine Simmering M.D.   On: 11/24/2020 14:13   CT Cervical Spine Wo Contrast  Result Date: 11/24/2020 CLINICAL DATA:  Facial trauma; Neck trauma (Age >= 65y) EXAM: CT HEAD WITHOUT CONTRAST CT MAXILLOFACIAL WITHOUT CONTRAST CT CERVICAL SPINE WITHOUT CONTRAST TECHNIQUE: Multidetector CT imaging of the head, cervical spine, and maxillofacial structures were performed using the standard protocol without intravenous contrast. Multiplanar CT image reconstructions of the cervical spine and maxillofacial structures were also generated. COMPARISON:  Head CT 07/31/2020, cervical spine CT 05/06/2018 FINDINGS: CT HEAD FINDINGS Brain: No evidence of acute intracranial hemorrhage or extra-axial collection.No evidence of mass lesion/concern mass effect.The ventricles are unchanged in size.Scattered subcortical and periventricular white matter hypodensities, nonspecific but likely sequela of chronic small vessel ischemic disease.Mild cerebral atrophy  Vascular: No hyperdense vessel or unexpected calcification. Skull: Normal. Negative for fracture or focal lesion. Other: None. CT MAXILLOFACIAL FINDINGS Osseous: No fracture or mandibular dislocation. No destructive process. Orbits: Prior bilateral lens surgeries. No traumatic or inflammatory finding. Sinuses: Mild mucosal thickening of the ethmoid air cells and sphenoid sinuses. Soft tissues: Negative CT CERVICAL SPINE FINDINGS Alignment: Normal. Skull base and vertebrae: Prior ACDF at C5-C6. Intact hardware without evidence of loosening. There is no evidence of acute cervical spine fracture. There is mild bilateral facet arthropathy. Mild degenerative disc disease at C4-C5 and C6-C7. Soft tissues and spinal canal: No prevertebral fluid or swelling. No visible canal hematoma. Disc levels: Preserved disc spaces.No large disc herniation, significant spinal canal or neuroforaminal narrowing. Upper chest: Paraseptal emphysema in the lung apices. Other: 1.3 cm right posterior thyroid nodule, unchanged from prior chest CT and does not require follow-up. IMPRESSION: No acute intracranial abnormality.  No acute facial fracture. No acute cervical spine fracture. Prior ACDF at C5-C6 without evidence of hardware complication. Electronically Signed   By: Maurine Simmering M.D.   On: 11/24/2020 14:13   DG Hip Unilat W or Wo Pelvis 2-3 Views Left  Result Date: 11/24/2020 CLINICAL DATA:  [place] at home today.  Left hip pain. EXAM: DG HIP (WITH OR WITHOUT PELVIS) 2-3V LEFT COMPARISON:  None. FINDINGS: No convincing fracture.  No bone lesion. Hip joints, SI joints and symphysis pubis are normally spaced and aligned. Skeletal structures are demineralized. Soft tissues are unremarkable other than arterial atherosclerotic calcifications. IMPRESSION: 1. No fracture or dislocation. Electronically Signed   By: Lajean Manes M.D.   On: 11/24/2020 12:48   CT Maxillofacial Wo Contrast  Result Date: 11/24/2020 CLINICAL DATA:  Facial trauma;  Neck trauma (Age >= 65y) EXAM: CT HEAD WITHOUT CONTRAST CT MAXILLOFACIAL WITHOUT CONTRAST CT CERVICAL SPINE WITHOUT CONTRAST TECHNIQUE: Multidetector CT imaging of the head, cervical spine, and maxillofacial structures were performed using the standard protocol without intravenous contrast. Multiplanar CT image reconstructions of the cervical spine and maxillofacial structures were also generated. COMPARISON:  Head CT 07/31/2020, cervical spine CT 05/06/2018 FINDINGS: CT HEAD FINDINGS Brain: No evidence of acute intracranial hemorrhage or extra-axial collection.No evidence of mass lesion/concern mass effect.The ventricles are unchanged in size.Scattered subcortical and periventricular white matter hypodensities, nonspecific but likely sequela of chronic small vessel ischemic disease.Mild cerebral atrophy Vascular: No hyperdense vessel or unexpected calcification. Skull: Normal. Negative for fracture or focal lesion. Other: None. CT MAXILLOFACIAL FINDINGS Osseous: No fracture or mandibular dislocation. No destructive process. Orbits: Prior bilateral lens surgeries. No traumatic or inflammatory finding. Sinuses: Mild mucosal thickening of the ethmoid air cells and sphenoid sinuses. Soft tissues: Negative CT CERVICAL SPINE FINDINGS Alignment: Normal. Skull base and vertebrae: Prior ACDF at C5-C6. Intact hardware without evidence of loosening. There is no evidence of acute cervical spine fracture. There is mild bilateral facet arthropathy. Mild degenerative disc disease at C4-C5 and C6-C7. Soft tissues and spinal canal: No prevertebral fluid or swelling. No visible canal hematoma. Disc levels: Preserved disc spaces.No large disc herniation, significant spinal canal or neuroforaminal narrowing. Upper chest: Paraseptal emphysema in the lung apices. Other: 1.3 cm right posterior thyroid nodule, unchanged from prior chest CT and does not require follow-up. IMPRESSION: No acute intracranial abnormality.  No acute facial  fracture. No acute cervical spine fracture. Prior ACDF at C5-C6 without evidence of hardware complication. Electronically Signed   By: Maurine Simmering M.D.   On: 11/24/2020 14:13    Assessment/Plan  1. Generalized weakness -   for Home health [name] [name] and OT, for therapeutic strengthening exercises -  fall precautions  2. Centrilobular emphysema ([place]) -  continue O2 @2L /min via [place] - albuterol (PROVENTIL) (2.5 MG/3ML) 0.083% nebulizer solution; Take 3 mLs (2.5 mg total) by nebulization every 6 (six) hours as needed for wheezing or shortness of breath.  Dispense: 60 mL; Refill: 0 - Ipratropium-Albuterol (COMBIVENT) 20-100 MCG/ACT AERS respimat; Inhale 1 puff into the lungs every 6 (six) hours.  Dispense: 1 each; Refill: 0 - guaiFENesin (MUCINEX) 600 MG 12 hr tablet; Take 1 tablet (600 mg total) by mouth 2 (two) times daily.  Dispense: 60 tablet; Refill: 0  3. Type 2 diabetes mellitus with stage 3 chronic kidney disease, without long-term current use of insulin, unspecified whether stage 3a or 3b CKD ([place]) Lab Results  Component Value Date   HGBA1C 6.6 (H) 11/24/2020   -   diet-controlled  4. History of GI bleed - pantoprazole (PROTONIX) 40 MG tablet; Take 1 tablet (40 mg total) by mouth 2 (two) times daily.  Dispense: 60 tablet; Refill: 0  5. Benign hypertension - metoprolol succinate (TOPROL-XL) 50 MG 24 hr tablet; Take 1 tablet (50 mg total) by mouth daily. Take with or immediately following a meal.  Dispense: 30 tablet; Refill: 0 - amLODipine (NORVASC) 5 MG tablet; Take 1 tablet (5 mg total) by mouth daily.  Dispense: 30 tablet; Refill: 0  6. CAD in native artery - clopidogrel (PLAVIX) 75 MG tablet; Take 1 tablet (75 mg total) by mouth daily.  Dispense: 30 tablet; Refill: 0 - aspirin 81 MG EC tablet; Take 1 tablet (81 mg total) by mouth daily.  Dispense: 30 tablet; Refill: 0 - atorvastatin (LIPITOR) 40 MG tablet; Take  1 tablet (40 mg total) by mouth daily.  Dispense: 30 tablet; Refill: 0  7.  Neuropathy - gabapentin (NEURONTIN) 100 MG capsule; Take 1 capsule (100 mg total) by mouth 2 (two) times daily.  Dispense: 60 capsule; Refill: 0  8. Vascular dementia without behavioral disturbance (HCC) - donepezil (ARICEPT) 5 MG tablet; TAKE 1 TABLET BY MOUTH EVERYDAY AT BEDTIME  Dispense: 30 tablet; Refill: 0     I have filled out patient's discharge paperwork and written prescriptions.  Patient will receive home health [name] [name] and OT.  DME provided:  None  Total discharge time: Greater than 30 minutes Greater than 50% was spent in counseling and coordination of care.   Discharge time involved coordination of the discharge process with social worker, nursing staff and therapy department. Medical justification for home health services verified.     Durenda Age, DNP, MSN, FNP-BC [facility] [phone] (Monday-Friday 8:00 a.m. - 5:00 p.m.) [phone] (after hours)

## 2020-12-06 ENCOUNTER — Other Ambulatory Visit: Payer: Self-pay | Admitting: Family Medicine

## 2020-12-06 DIAGNOSIS — G3184 Mild cognitive impairment, so stated: Secondary | ICD-10-CM

## 2020-12-06 DIAGNOSIS — I10 Essential (primary) hypertension: Secondary | ICD-10-CM

## 2020-12-06 MED ORDER — MAGNESIUM OXIDE 400 MG PO TABS: 400.0000 mg | ORAL_TABLET | Freq: Two times a day (BID) | ORAL | 0 refills | Status: AC

## 2020-12-06 MED ORDER — DONEPEZIL HCL 5 MG PO TABS: ORAL_TABLET | ORAL | 0 refills | Status: DC

## 2020-12-06 MED ORDER — GABAPENTIN 100 MG PO CAPS: 100.0000 mg | ORAL_CAPSULE | Freq: Two times a day (BID) | ORAL | 0 refills | Status: DC

## 2020-12-06 MED ORDER — SENNOSIDES-DOCUSATE SODIUM 8.6-50 MG PO TABS: 2.0000 | ORAL_TABLET | Freq: Two times a day (BID) | ORAL | 0 refills | Status: AC

## 2020-12-06 MED ORDER — IPRATROPIUM-ALBUTEROL 20-100 MCG/ACT IN AERS: 1.0000 | INHALATION_SPRAY | Freq: Four times a day (QID) | RESPIRATORY_TRACT | 0 refills | Status: AC

## 2020-12-06 MED ORDER — GUAIFENESIN ER 600 MG PO TB12: 600.0000 mg | ORAL_TABLET | Freq: Two times a day (BID) | ORAL | 0 refills | Status: AC

## 2020-12-06 MED ORDER — ASPIRIN 81 MG PO TBEC: 81.0000 mg | DELAYED_RELEASE_TABLET | Freq: Every day | ORAL | 0 refills | Status: AC

## 2020-12-06 MED ORDER — AMLODIPINE BESYLATE 5 MG PO TABS: 5.0000 mg | ORAL_TABLET | Freq: Every day | ORAL | 0 refills | Status: DC

## 2020-12-06 MED ORDER — ZINC SULFATE 220 (50 ZN) MG PO CAPS: 220.0000 mg | ORAL_CAPSULE | Freq: Every day | ORAL | 0 refills | Status: AC

## 2020-12-06 MED ORDER — METOPROLOL SUCCINATE ER 50 MG PO TB24: 50.0000 mg | ORAL_TABLET | Freq: Every day | ORAL | 0 refills | Status: AC

## 2020-12-06 MED ORDER — ATORVASTATIN CALCIUM 40 MG PO TABS: 40.0000 mg | ORAL_TABLET | Freq: Every day | ORAL | 0 refills | Status: DC

## 2020-12-06 MED ORDER — CLOPIDOGREL BISULFATE 75 MG PO TABS: 75.0000 mg | ORAL_TABLET | Freq: Every day | ORAL | 0 refills | Status: DC

## 2020-12-06 MED ORDER — PANTOPRAZOLE SODIUM 40 MG PO TBEC: 40.0000 mg | DELAYED_RELEASE_TABLET | Freq: Two times a day (BID) | ORAL | 0 refills | Status: DC

## 2020-12-06 MED ORDER — ALBUTEROL SULFATE (2.5 MG/3ML) 0.083% IN NEBU: 2.5000 mg | INHALATION_SOLUTION | Freq: Four times a day (QID) | RESPIRATORY_TRACT | 0 refills | Status: AC | PRN

## 2020-12-07 DIAGNOSIS — J9611 Chronic respiratory failure with hypoxia: Secondary | ICD-10-CM | POA: Diagnosis not present

## 2020-12-07 DIAGNOSIS — J432 Centrilobular emphysema: Secondary | ICD-10-CM | POA: Diagnosis not present

## 2020-12-07 DIAGNOSIS — Z9181 History of falling: Secondary | ICD-10-CM | POA: Diagnosis not present

## 2020-12-07 NOTE — Telephone Encounter (Signed)
Last RF 12/06/20 by Monina C. Wynelle Cleveland NP ([place]) for both Aricept and amlodipine.

## 2020-12-10 ENCOUNTER — Inpatient Hospital Stay: Payer: Medicare Other | Admitting: Family Medicine

## 2020-12-11 ENCOUNTER — Telehealth: Payer: Self-pay | Admitting: Family Medicine

## 2020-12-11 DIAGNOSIS — Z9181 History of falling: Secondary | ICD-10-CM | POA: Diagnosis not present

## 2020-12-11 DIAGNOSIS — J449 Chronic obstructive pulmonary disease, unspecified: Secondary | ICD-10-CM | POA: Diagnosis not present

## 2020-12-11 DIAGNOSIS — Z87891 Personal history of nicotine dependence: Secondary | ICD-10-CM | POA: Diagnosis not present

## 2020-12-11 DIAGNOSIS — I10 Essential (primary) hypertension: Secondary | ICD-10-CM | POA: Diagnosis not present

## 2020-12-11 DIAGNOSIS — E1159 Type 2 diabetes mellitus with other circulatory complications: Secondary | ICD-10-CM | POA: Diagnosis not present

## 2020-12-11 DIAGNOSIS — Z7982 Long term (current) use of aspirin: Secondary | ICD-10-CM | POA: Diagnosis not present

## 2020-12-11 DIAGNOSIS — E785 Hyperlipidemia, unspecified: Secondary | ICD-10-CM | POA: Diagnosis not present

## 2020-12-11 DIAGNOSIS — Z8673 Personal history of transient ischemic attack (TIA), and cerebral infarction without residual deficits: Secondary | ICD-10-CM | POA: Diagnosis not present

## 2020-12-11 DIAGNOSIS — I2510 Atherosclerotic heart disease of native coronary artery without angina pectoris: Secondary | ICD-10-CM | POA: Diagnosis not present

## 2020-12-11 DIAGNOSIS — Z85118 Personal history of other malignant neoplasm of bronchus and lung: Secondary | ICD-10-CM | POA: Diagnosis not present

## 2020-12-11 DIAGNOSIS — Z7984 Long term (current) use of oral hypoglycemic drugs: Secondary | ICD-10-CM | POA: Diagnosis not present

## 2020-12-11 DIAGNOSIS — H3530 Unspecified macular degeneration: Secondary | ICD-10-CM | POA: Diagnosis not present

## 2020-12-11 DIAGNOSIS — Z9981 Dependence on supplemental oxygen: Secondary | ICD-10-CM | POA: Diagnosis not present

## 2020-12-11 NOTE — Telephone Encounter (Signed)
L/M advising as below.

## 2020-12-11 NOTE — Telephone Encounter (Signed)
Copied from [place] [id number]. Topic: Quick Communication - Home Health Verbal Orders >> Dec 11, 2020  1:49 PM Yvette Rack wrote: Caller/Agency: Araceli Bouche with [facility] Number: [phone] Requesting OT/PT/Skilled Nursing/Social Work/Speech Therapy: PT  Frequency: 1 time a week for 1 week, 2 times a week for 4 weeks, and 1 time a week for 4 weeks

## 2020-12-16 ENCOUNTER — Inpatient Hospital Stay: Payer: Medicare Other | Admitting: Family Medicine

## 2020-12-16 DIAGNOSIS — Z87891 Personal history of nicotine dependence: Secondary | ICD-10-CM | POA: Diagnosis not present

## 2020-12-16 DIAGNOSIS — H3530 Unspecified macular degeneration: Secondary | ICD-10-CM | POA: Diagnosis not present

## 2020-12-16 DIAGNOSIS — I10 Essential (primary) hypertension: Secondary | ICD-10-CM | POA: Diagnosis not present

## 2020-12-16 DIAGNOSIS — Z8673 Personal history of transient ischemic attack (TIA), and cerebral infarction without residual deficits: Secondary | ICD-10-CM | POA: Diagnosis not present

## 2020-12-16 DIAGNOSIS — J449 Chronic obstructive pulmonary disease, unspecified: Secondary | ICD-10-CM | POA: Diagnosis not present

## 2020-12-16 DIAGNOSIS — Z85118 Personal history of other malignant neoplasm of bronchus and lung: Secondary | ICD-10-CM | POA: Diagnosis not present

## 2020-12-16 DIAGNOSIS — Z9181 History of falling: Secondary | ICD-10-CM | POA: Diagnosis not present

## 2020-12-16 DIAGNOSIS — I2510 Atherosclerotic heart disease of native coronary artery without angina pectoris: Secondary | ICD-10-CM | POA: Diagnosis not present

## 2020-12-16 DIAGNOSIS — E785 Hyperlipidemia, unspecified: Secondary | ICD-10-CM | POA: Diagnosis not present

## 2020-12-16 DIAGNOSIS — Z9981 Dependence on supplemental oxygen: Secondary | ICD-10-CM | POA: Diagnosis not present

## 2020-12-16 DIAGNOSIS — Z7982 Long term (current) use of aspirin: Secondary | ICD-10-CM | POA: Diagnosis not present

## 2020-12-16 DIAGNOSIS — Z7984 Long term (current) use of oral hypoglycemic drugs: Secondary | ICD-10-CM | POA: Diagnosis not present

## 2020-12-16 DIAGNOSIS — E1159 Type 2 diabetes mellitus with other circulatory complications: Secondary | ICD-10-CM | POA: Diagnosis not present

## 2020-12-18 DIAGNOSIS — Z85118 Personal history of other malignant neoplasm of bronchus and lung: Secondary | ICD-10-CM | POA: Diagnosis not present

## 2020-12-18 DIAGNOSIS — E1159 Type 2 diabetes mellitus with other circulatory complications: Secondary | ICD-10-CM | POA: Diagnosis not present

## 2020-12-18 DIAGNOSIS — J449 Chronic obstructive pulmonary disease, unspecified: Secondary | ICD-10-CM | POA: Diagnosis not present

## 2020-12-18 DIAGNOSIS — I2510 Atherosclerotic heart disease of native coronary artery without angina pectoris: Secondary | ICD-10-CM | POA: Diagnosis not present

## 2020-12-18 DIAGNOSIS — Z9981 Dependence on supplemental oxygen: Secondary | ICD-10-CM | POA: Diagnosis not present

## 2020-12-18 DIAGNOSIS — Z7984 Long term (current) use of oral hypoglycemic drugs: Secondary | ICD-10-CM | POA: Diagnosis not present

## 2020-12-18 DIAGNOSIS — H3530 Unspecified macular degeneration: Secondary | ICD-10-CM | POA: Diagnosis not present

## 2020-12-18 DIAGNOSIS — Z9181 History of falling: Secondary | ICD-10-CM | POA: Diagnosis not present

## 2020-12-18 DIAGNOSIS — I10 Essential (primary) hypertension: Secondary | ICD-10-CM | POA: Diagnosis not present

## 2020-12-18 DIAGNOSIS — E785 Hyperlipidemia, unspecified: Secondary | ICD-10-CM | POA: Diagnosis not present

## 2020-12-18 DIAGNOSIS — Z8673 Personal history of transient ischemic attack (TIA), and cerebral infarction without residual deficits: Secondary | ICD-10-CM | POA: Diagnosis not present

## 2020-12-18 DIAGNOSIS — Z87891 Personal history of nicotine dependence: Secondary | ICD-10-CM | POA: Diagnosis not present

## 2020-12-18 DIAGNOSIS — Z7982 Long term (current) use of aspirin: Secondary | ICD-10-CM | POA: Diagnosis not present

## 2020-12-19 ENCOUNTER — Encounter: Payer: Self-pay | Admitting: Family Medicine

## 2020-12-19 ENCOUNTER — Other Ambulatory Visit: Payer: Self-pay

## 2020-12-19 ENCOUNTER — Telehealth: Payer: Self-pay | Admitting: Student

## 2020-12-19 ENCOUNTER — Ambulatory Visit (INDEPENDENT_AMBULATORY_CARE_PROVIDER_SITE_OTHER): Payer: Medicare Other | Admitting: Family Medicine

## 2020-12-19 VITALS — BP 119/62 | HR 52 | Resp 18 | Ht 64.0 in | Wt 120.0 lb

## 2020-12-19 DIAGNOSIS — F17200 Nicotine dependence, unspecified, uncomplicated: Secondary | ICD-10-CM | POA: Diagnosis not present

## 2020-12-19 DIAGNOSIS — J432 Centrilobular emphysema: Secondary | ICD-10-CM | POA: Diagnosis not present

## 2020-12-19 DIAGNOSIS — Z23 Encounter for immunization: Secondary | ICD-10-CM

## 2020-12-19 DIAGNOSIS — I2510 Atherosclerotic heart disease of native coronary artery without angina pectoris: Secondary | ICD-10-CM | POA: Diagnosis not present

## 2020-12-19 DIAGNOSIS — N179 Acute kidney failure, unspecified: Secondary | ICD-10-CM | POA: Diagnosis not present

## 2020-12-19 DIAGNOSIS — L89621 Pressure ulcer of left heel, stage 1: Secondary | ICD-10-CM | POA: Diagnosis not present

## 2020-12-19 DIAGNOSIS — C3411 Malignant neoplasm of upper lobe, right bronchus or lung: Secondary | ICD-10-CM

## 2020-12-19 DIAGNOSIS — E538 Deficiency of other specified B group vitamins: Secondary | ICD-10-CM | POA: Diagnosis not present

## 2020-12-19 DIAGNOSIS — R29898 Other symptoms and signs involving the musculoskeletal system: Secondary | ICD-10-CM | POA: Diagnosis not present

## 2020-12-19 NOTE — Telephone Encounter (Signed)
Spoke with husband, [name], to offer to schedule a Palliative f/u visit (post discharged from rehab facility) and this was scheduled for 01/06/21 @ 12:30 PM

## 2020-12-19 NOTE — Progress Notes (Signed)
I,[name] [name],acting as a scribe for [name] Durie, MD.,have documented all relevant documentation on the behalf of [name] Durie, MD,as directed by  [name] Durie, MD while in the presence of [name] Durie, MD.   Established patient visit   Patient: [name] [name]   DOB: 11-27-1940   80 y.o. Female  MRN: [id number] Visit Date: 12/19/2020  Today's healthcare provider: [name], MD   Chief Complaint  Patient presents with   Hospitalization Follow-up   Subjective    HPI  Very irritable elderly white female comes in today for follow-up.  Everything is stable.  She continues to get weaker.  She is starting to get a skin breakdown on the back of her left heel, early decubitus.  Her husband is getting tired as he is having to do much of the care for the patient at home.  She is barely able to help transfer at this time. Follow up Hospitalization  Patient was admitted to [facility] on 11/24/2020 and discharged on 12/13/2020. She was treated for weakness and fall. Treatment for this included see notes in chart. Telephone follow up was done on none She reports good compliance with treatment. She reports this condition is worsened.  ----------------------------------------------------------------------------------------- -  Patient has wound on left foot.    Medications: Outpatient Medications Prior to Visit  Medication Sig   albuterol (PROVENTIL) (2.5 MG/3ML) 0.083% nebulizer solution Take 3 mLs (2.5 mg total) by nebulization every 6 (six) hours as needed for wheezing or shortness of breath.   amLODipine (NORVASC) 5 MG tablet Take 1 tablet (5 mg total) by mouth daily.   aspirin 81 MG EC tablet Take 1 tablet (81 mg total) by mouth daily.   atorvastatin (LIPITOR) 40 MG tablet Take 1 tablet (40 mg total) by mouth daily.   bisacodyl (DULCOLAX) 10 MG suppository Place 10 mg rectally as needed for moderate constipation.   clopidogrel (PLAVIX)  75 MG tablet Take 1 tablet (75 mg total) by mouth daily.   donepezil (ARICEPT) 5 MG tablet TAKE 1 TABLET BY MOUTH EVERYDAY AT BEDTIME   gabapentin (NEURONTIN) 100 MG capsule Take 1 capsule (100 mg total) by mouth 2 (two) times daily.   guaiFENesin (MUCINEX) 600 MG 12 hr tablet Take 1 tablet (600 mg total) by mouth 2 (two) times daily.   Ipratropium-Albuterol (COMBIVENT) 20-100 MCG/ACT AERS respimat Inhale 1 puff into the lungs every 6 (six) hours.   Magnesium Hydroxide (MILK OF MAGNESIA PO) Take 30 mLs by mouth as needed.   magnesium oxide (MAG-OX) 400 MG tablet Take 1 tablet (400 mg total) by mouth 2 (two) times daily.   metoprolol succinate (TOPROL-XL) 100 MG 24 hr tablet Take 100 mg by mouth daily.   metoprolol succinate (TOPROL-XL) 50 MG 24 hr tablet Take 1 tablet (50 mg total) by mouth daily. Take with or immediately following a meal.   Multiple Vitamin (MULTIVITAMIN) tablet Take 0.5 tablets by mouth 2 (two) times daily.   pantoprazole (PROTONIX) 40 MG tablet Take 1 tablet (40 mg total) by mouth 2 (two) times daily.   senna-docusate (SENOKOT-S) 8.6-50 MG tablet Take 2 tablets by mouth 2 (two) times daily.   Sodium Phosphates (RA SALINE ENEMA RE) Place rectally as needed.   VITAMIN D PO Take by mouth daily.    vitamin E 180 MG (400 UNITS) capsule Take 400 Units by mouth daily.   zinc sulfate 220 (50 Zn) MG capsule Take 1 capsule (220 mg  total) by mouth daily.   No facility-administered medications prior to visit.    Review of Systems  Constitutional:  Negative for appetite change, chills, fatigue and fever.  Respiratory:  Negative for chest tightness and shortness of breath.   Cardiovascular:  Negative for chest pain and palpitations.  Gastrointestinal:  Negative for abdominal pain, nausea and vomiting.  Neurological:  Negative for dizziness and weakness.      Objective    BP 119/62 (BP Location: Right Arm, Patient Position: Sitting, Cuff Size: Normal)   Pulse (!) 52   Resp 18    Ht 5\' 4"  (1.626 m)   Wt 120 lb (54.4 kg)   SpO2 99%   BMI 20.60 kg/m  {Show previous vital signs (optional):23777}  Physical Exam Vitals and nursing note reviewed.  Constitutional:      Appearance: Normal appearance. She is normal weight.  Cardiovascular:     Rate and Rhythm: Normal rate and regular rhythm.     Pulses: Normal pulses.     Heart sounds: Normal heart sounds.  Pulmonary:     Effort: Pulmonary effort is normal.     Breath sounds: Normal breath sounds.  Abdominal:     General: Bowel sounds are normal.     Palpations: Abdomen is soft.  Musculoskeletal:     Cervical back: Normal range of motion and neck supple.  Skin:    General: Skin is warm and dry.     Comments: Early breakdown of the skin of the left heel consistent with decubitus ulcer.  There are no signs of infection.  Neurological:     Mental Status: She is alert and oriented to person, place, and time. Mental status is at baseline.  Psychiatric:        Behavior: Behavior normal.      No results found for any visits on 12/19/20.  Assessment & Plan     1. Need for influenza vaccination  - Flu Vaccine QUAD High Dose(Fluad)  2. CAD in native artery All risk factors treated  3. Primary cancer of right upper lobe of lung ([place]) Followed by oncology.  4. Centrilobular emphysema ([place])   5. Weakness of both legs Patient may need assisted living. MOST discussed with family She is in failing health. 6. AKI (acute kidney injury) ([place]) Follow renal function  7. TOBACCO ABUSE Patient is now quit smoking  8. B12 deficiency   9. Pressure injury of left heel, stage 1 Advised patient and husband to keep a pillow under her legs to keep the pressure off of her heels.   No follow-ups on file.      I, [name] Durie, MD, have reviewed all documentation for this visit. The documentation on 12/23/20 for the exam, diagnosis, procedures, and orders are all accurate and complete.    Delaina Fetsch Cranford Mon, MD  [facility] [phone] (phone) [phone] (fax)  [place]

## 2020-12-23 DIAGNOSIS — J449 Chronic obstructive pulmonary disease, unspecified: Secondary | ICD-10-CM | POA: Diagnosis not present

## 2020-12-23 DIAGNOSIS — I2510 Atherosclerotic heart disease of native coronary artery without angina pectoris: Secondary | ICD-10-CM | POA: Diagnosis not present

## 2020-12-23 DIAGNOSIS — Z8673 Personal history of transient ischemic attack (TIA), and cerebral infarction without residual deficits: Secondary | ICD-10-CM | POA: Diagnosis not present

## 2020-12-23 DIAGNOSIS — E785 Hyperlipidemia, unspecified: Secondary | ICD-10-CM | POA: Diagnosis not present

## 2020-12-23 DIAGNOSIS — Z85118 Personal history of other malignant neoplasm of bronchus and lung: Secondary | ICD-10-CM | POA: Diagnosis not present

## 2020-12-23 DIAGNOSIS — Z9981 Dependence on supplemental oxygen: Secondary | ICD-10-CM | POA: Diagnosis not present

## 2020-12-23 DIAGNOSIS — I10 Essential (primary) hypertension: Secondary | ICD-10-CM | POA: Diagnosis not present

## 2020-12-23 DIAGNOSIS — Z87891 Personal history of nicotine dependence: Secondary | ICD-10-CM | POA: Diagnosis not present

## 2020-12-23 DIAGNOSIS — Z7982 Long term (current) use of aspirin: Secondary | ICD-10-CM | POA: Diagnosis not present

## 2020-12-23 DIAGNOSIS — Z7984 Long term (current) use of oral hypoglycemic drugs: Secondary | ICD-10-CM | POA: Diagnosis not present

## 2020-12-23 DIAGNOSIS — Z9181 History of falling: Secondary | ICD-10-CM | POA: Diagnosis not present

## 2020-12-23 DIAGNOSIS — E1159 Type 2 diabetes mellitus with other circulatory complications: Secondary | ICD-10-CM | POA: Diagnosis not present

## 2020-12-23 DIAGNOSIS — H3530 Unspecified macular degeneration: Secondary | ICD-10-CM | POA: Diagnosis not present

## 2020-12-24 DIAGNOSIS — J449 Chronic obstructive pulmonary disease, unspecified: Secondary | ICD-10-CM | POA: Diagnosis not present

## 2020-12-24 DIAGNOSIS — R0602 Shortness of breath: Secondary | ICD-10-CM | POA: Diagnosis not present

## 2020-12-24 DIAGNOSIS — H353221 Exudative age-related macular degeneration, left eye, with active choroidal neovascularization: Secondary | ICD-10-CM | POA: Diagnosis not present

## 2020-12-25 DIAGNOSIS — I10 Essential (primary) hypertension: Secondary | ICD-10-CM | POA: Diagnosis not present

## 2020-12-25 DIAGNOSIS — Z7984 Long term (current) use of oral hypoglycemic drugs: Secondary | ICD-10-CM | POA: Diagnosis not present

## 2020-12-25 DIAGNOSIS — Z9181 History of falling: Secondary | ICD-10-CM | POA: Diagnosis not present

## 2020-12-25 DIAGNOSIS — E1159 Type 2 diabetes mellitus with other circulatory complications: Secondary | ICD-10-CM | POA: Diagnosis not present

## 2020-12-25 DIAGNOSIS — J449 Chronic obstructive pulmonary disease, unspecified: Secondary | ICD-10-CM | POA: Diagnosis not present

## 2020-12-25 DIAGNOSIS — H3530 Unspecified macular degeneration: Secondary | ICD-10-CM | POA: Diagnosis not present

## 2020-12-25 DIAGNOSIS — I2510 Atherosclerotic heart disease of native coronary artery without angina pectoris: Secondary | ICD-10-CM | POA: Diagnosis not present

## 2020-12-25 DIAGNOSIS — Z87891 Personal history of nicotine dependence: Secondary | ICD-10-CM | POA: Diagnosis not present

## 2020-12-25 DIAGNOSIS — E785 Hyperlipidemia, unspecified: Secondary | ICD-10-CM | POA: Diagnosis not present

## 2020-12-25 DIAGNOSIS — Z85118 Personal history of other malignant neoplasm of bronchus and lung: Secondary | ICD-10-CM | POA: Diagnosis not present

## 2020-12-25 DIAGNOSIS — Z9981 Dependence on supplemental oxygen: Secondary | ICD-10-CM | POA: Diagnosis not present

## 2020-12-25 DIAGNOSIS — Z7982 Long term (current) use of aspirin: Secondary | ICD-10-CM | POA: Diagnosis not present

## 2020-12-25 DIAGNOSIS — Z8673 Personal history of transient ischemic attack (TIA), and cerebral infarction without residual deficits: Secondary | ICD-10-CM | POA: Diagnosis not present

## 2020-12-26 DIAGNOSIS — H3530 Unspecified macular degeneration: Secondary | ICD-10-CM | POA: Diagnosis not present

## 2020-12-26 DIAGNOSIS — Z7984 Long term (current) use of oral hypoglycemic drugs: Secondary | ICD-10-CM | POA: Diagnosis not present

## 2020-12-26 DIAGNOSIS — E1159 Type 2 diabetes mellitus with other circulatory complications: Secondary | ICD-10-CM | POA: Diagnosis not present

## 2020-12-26 DIAGNOSIS — Z85118 Personal history of other malignant neoplasm of bronchus and lung: Secondary | ICD-10-CM | POA: Diagnosis not present

## 2020-12-26 DIAGNOSIS — Z9181 History of falling: Secondary | ICD-10-CM | POA: Diagnosis not present

## 2020-12-26 DIAGNOSIS — Z8673 Personal history of transient ischemic attack (TIA), and cerebral infarction without residual deficits: Secondary | ICD-10-CM | POA: Diagnosis not present

## 2020-12-26 DIAGNOSIS — J449 Chronic obstructive pulmonary disease, unspecified: Secondary | ICD-10-CM | POA: Diagnosis not present

## 2020-12-26 DIAGNOSIS — I10 Essential (primary) hypertension: Secondary | ICD-10-CM | POA: Diagnosis not present

## 2020-12-26 DIAGNOSIS — Z87891 Personal history of nicotine dependence: Secondary | ICD-10-CM | POA: Diagnosis not present

## 2020-12-26 DIAGNOSIS — Z9981 Dependence on supplemental oxygen: Secondary | ICD-10-CM | POA: Diagnosis not present

## 2020-12-26 DIAGNOSIS — I2510 Atherosclerotic heart disease of native coronary artery without angina pectoris: Secondary | ICD-10-CM | POA: Diagnosis not present

## 2020-12-26 DIAGNOSIS — Z7982 Long term (current) use of aspirin: Secondary | ICD-10-CM | POA: Diagnosis not present

## 2020-12-26 DIAGNOSIS — E785 Hyperlipidemia, unspecified: Secondary | ICD-10-CM | POA: Diagnosis not present

## 2020-12-27 ENCOUNTER — Other Ambulatory Visit: Payer: Self-pay | Admitting: Family Medicine

## 2020-12-27 DIAGNOSIS — I10 Essential (primary) hypertension: Secondary | ICD-10-CM

## 2020-12-28 NOTE — Telephone Encounter (Signed)
Requested medications are due for refill today no  Requested medications are on the active medication list no  Last refill 11/02/20  Last visit 12/19/20  Future visit scheduled 04/22/21  Notes to clinic was discontinued at discharge, please assess.

## 2020-12-30 DIAGNOSIS — Z87891 Personal history of nicotine dependence: Secondary | ICD-10-CM | POA: Diagnosis not present

## 2020-12-30 DIAGNOSIS — Z9181 History of falling: Secondary | ICD-10-CM | POA: Diagnosis not present

## 2020-12-30 DIAGNOSIS — I10 Essential (primary) hypertension: Secondary | ICD-10-CM | POA: Diagnosis not present

## 2020-12-30 DIAGNOSIS — I2510 Atherosclerotic heart disease of native coronary artery without angina pectoris: Secondary | ICD-10-CM | POA: Diagnosis not present

## 2020-12-30 DIAGNOSIS — H3530 Unspecified macular degeneration: Secondary | ICD-10-CM | POA: Diagnosis not present

## 2020-12-30 DIAGNOSIS — Z7984 Long term (current) use of oral hypoglycemic drugs: Secondary | ICD-10-CM | POA: Diagnosis not present

## 2020-12-30 DIAGNOSIS — E1159 Type 2 diabetes mellitus with other circulatory complications: Secondary | ICD-10-CM | POA: Diagnosis not present

## 2020-12-30 DIAGNOSIS — E785 Hyperlipidemia, unspecified: Secondary | ICD-10-CM | POA: Diagnosis not present

## 2020-12-30 DIAGNOSIS — Z7982 Long term (current) use of aspirin: Secondary | ICD-10-CM | POA: Diagnosis not present

## 2020-12-30 DIAGNOSIS — Z85118 Personal history of other malignant neoplasm of bronchus and lung: Secondary | ICD-10-CM | POA: Diagnosis not present

## 2020-12-30 DIAGNOSIS — Z9981 Dependence on supplemental oxygen: Secondary | ICD-10-CM | POA: Diagnosis not present

## 2020-12-30 DIAGNOSIS — Z8673 Personal history of transient ischemic attack (TIA), and cerebral infarction without residual deficits: Secondary | ICD-10-CM | POA: Diagnosis not present

## 2020-12-30 DIAGNOSIS — J449 Chronic obstructive pulmonary disease, unspecified: Secondary | ICD-10-CM | POA: Diagnosis not present

## 2020-12-31 DIAGNOSIS — E785 Hyperlipidemia, unspecified: Secondary | ICD-10-CM | POA: Diagnosis not present

## 2020-12-31 DIAGNOSIS — Z85118 Personal history of other malignant neoplasm of bronchus and lung: Secondary | ICD-10-CM | POA: Diagnosis not present

## 2020-12-31 DIAGNOSIS — J449 Chronic obstructive pulmonary disease, unspecified: Secondary | ICD-10-CM | POA: Diagnosis not present

## 2020-12-31 DIAGNOSIS — Z8673 Personal history of transient ischemic attack (TIA), and cerebral infarction without residual deficits: Secondary | ICD-10-CM | POA: Diagnosis not present

## 2020-12-31 DIAGNOSIS — I2510 Atherosclerotic heart disease of native coronary artery without angina pectoris: Secondary | ICD-10-CM | POA: Diagnosis not present

## 2020-12-31 DIAGNOSIS — Z9981 Dependence on supplemental oxygen: Secondary | ICD-10-CM | POA: Diagnosis not present

## 2020-12-31 DIAGNOSIS — Z7982 Long term (current) use of aspirin: Secondary | ICD-10-CM | POA: Diagnosis not present

## 2020-12-31 DIAGNOSIS — Z7984 Long term (current) use of oral hypoglycemic drugs: Secondary | ICD-10-CM | POA: Diagnosis not present

## 2020-12-31 DIAGNOSIS — E1159 Type 2 diabetes mellitus with other circulatory complications: Secondary | ICD-10-CM | POA: Diagnosis not present

## 2020-12-31 DIAGNOSIS — Z87891 Personal history of nicotine dependence: Secondary | ICD-10-CM | POA: Diagnosis not present

## 2020-12-31 DIAGNOSIS — I10 Essential (primary) hypertension: Secondary | ICD-10-CM | POA: Diagnosis not present

## 2020-12-31 DIAGNOSIS — H3530 Unspecified macular degeneration: Secondary | ICD-10-CM | POA: Diagnosis not present

## 2020-12-31 DIAGNOSIS — Z9181 History of falling: Secondary | ICD-10-CM | POA: Diagnosis not present

## 2021-01-02 DIAGNOSIS — Z9981 Dependence on supplemental oxygen: Secondary | ICD-10-CM | POA: Diagnosis not present

## 2021-01-02 DIAGNOSIS — I2510 Atherosclerotic heart disease of native coronary artery without angina pectoris: Secondary | ICD-10-CM | POA: Diagnosis not present

## 2021-01-02 DIAGNOSIS — E1159 Type 2 diabetes mellitus with other circulatory complications: Secondary | ICD-10-CM | POA: Diagnosis not present

## 2021-01-02 DIAGNOSIS — Z85118 Personal history of other malignant neoplasm of bronchus and lung: Secondary | ICD-10-CM | POA: Diagnosis not present

## 2021-01-02 DIAGNOSIS — I10 Essential (primary) hypertension: Secondary | ICD-10-CM | POA: Diagnosis not present

## 2021-01-02 DIAGNOSIS — Z8673 Personal history of transient ischemic attack (TIA), and cerebral infarction without residual deficits: Secondary | ICD-10-CM | POA: Diagnosis not present

## 2021-01-02 DIAGNOSIS — J449 Chronic obstructive pulmonary disease, unspecified: Secondary | ICD-10-CM | POA: Diagnosis not present

## 2021-01-02 DIAGNOSIS — Z7982 Long term (current) use of aspirin: Secondary | ICD-10-CM | POA: Diagnosis not present

## 2021-01-02 DIAGNOSIS — Z87891 Personal history of nicotine dependence: Secondary | ICD-10-CM | POA: Diagnosis not present

## 2021-01-02 DIAGNOSIS — Z7984 Long term (current) use of oral hypoglycemic drugs: Secondary | ICD-10-CM | POA: Diagnosis not present

## 2021-01-02 DIAGNOSIS — E785 Hyperlipidemia, unspecified: Secondary | ICD-10-CM | POA: Diagnosis not present

## 2021-01-02 DIAGNOSIS — H3530 Unspecified macular degeneration: Secondary | ICD-10-CM | POA: Diagnosis not present

## 2021-01-02 DIAGNOSIS — Z9181 History of falling: Secondary | ICD-10-CM | POA: Diagnosis not present

## 2021-01-06 ENCOUNTER — Other Ambulatory Visit: Payer: Self-pay

## 2021-01-06 ENCOUNTER — Other Ambulatory Visit: Payer: Medicare Other | Admitting: Student

## 2021-01-06 DIAGNOSIS — R531 Weakness: Secondary | ICD-10-CM

## 2021-01-06 DIAGNOSIS — Z515 Encounter for palliative care: Secondary | ICD-10-CM | POA: Diagnosis not present

## 2021-01-06 DIAGNOSIS — J432 Centrilobular emphysema: Secondary | ICD-10-CM | POA: Diagnosis not present

## 2021-01-06 DIAGNOSIS — R0609 Other forms of dyspnea: Secondary | ICD-10-CM

## 2021-01-06 DIAGNOSIS — J9611 Chronic respiratory failure with hypoxia: Secondary | ICD-10-CM | POA: Diagnosis not present

## 2021-01-06 DIAGNOSIS — Z9181 History of falling: Secondary | ICD-10-CM | POA: Diagnosis not present

## 2021-01-06 NOTE — Progress Notes (Signed)
[facility] Pall[facility] Consult Note Telephone: [phone]  Fax: [phone]    Date of encounter: 01/06/21 12:34 PM PATIENT NAME: [name] [name] [address] [place] [address]   [phone] (home)  DOB: [birth year] MRN: [id number] PRIMARY CARE PROVIDER:    Jerrol Banana., MD,  [address] [place] [address] [phone]  REFERRING PROVIDER:   Jerrol Banana., MD [address] [place],  [place] [address] [phone]  RESPONSIBLE PARTY:    Contact Information     Name Relation Home Work Mobile   Mccorvey,[name] [name] [phone]  [phone]   [name] [name] Daughter [phone]  [phone]   [name] [name] [phone]     [name] [name],[name] [name] Relative   [phone]        I met face to face with patient and family in the home. Palliative Care was asked to follow this patient by consultation request of  Jerrol Banana.,* to address advance care planning and complex medical decision making. This is a follow up visit.                                   ASSESSMENT AND PLAN / RECOMMENDATIONS:   Advance Care Planning/Goals of Care: Goals include to maximize quality of life and symptom management.   CODE STATUS:DNR  Symptom Management/Plan:  Dyspnea-secondary to COPD, Continue oxygen at 3 lpm via nasal canula. Reviewed directions of inhaler, nebulizer treatment. Recommend using ipratropium bromide every 4 hours PRN. Currently using once a day. Mucinex BID. Continue using flutter valve.  Generalized weakness-secondary to COPD. Continue PT/OT as directed. She is encouraged to continue exercises to maintain her current level. Use walker, w/c for safety.   Follow up Palliative Care Visit: Palliative care will continue to follow for complex medical decision making, advance care planning, and clarification of goals. Return in 8 weeks or prn.  I spent 25 minutes providing  this consultation. More than 50% of the time in this consultation was spent in counseling and care coordination.  PPS: 40%  HOSPICE ELIGIBILITY/DIAGNOSIS: TBD  Chief Complaint: Palliative Medicine follow up visit.   HISTORY OF PRESENT ILLNESS:  [name] [name] is a 80 y.o. year old female  with COPD, CKD 3, right upper lobe cancer, T2DM with polyneuropathy. Patient last saw Dr. [name]- 2021; she received chemotherapy and radiation. Currently under surveillance. ED visit on 11/18/2020 due to weakness; hospitalization 8/21 through 11/26/2020 due to fall secondary to orthostatic hypotension.  Patient denies having pain, nausea, constipation. She states her shortness of breath has been stable. Patient with chronic cough; although no worsening per patient or husband. She is taking Mucinex once a day. Husband also takes states she is using nebulizer once a day. Husband states home health therapy is currently seeing patient; he believes it is through [facility].  She is ambulating with walker short distances. She denies having any recent falls since hospitalization.  Denies any dizziness or feeling lightheaded. She endorses a good appetite. Patient states blood sugar was 170 mg/dL this morning. Husband states blood sugars are usually between 100-120 mg/ dL each a.m. Family checks blood pressure routinely. No hypotensive episodes/readings reported. She endorses sleeping well at night. No recent changes in medications, no recent infections. A 10-point review of systems is negative, except for the pertinent positives and negatives detailed in the HPI.     History obtained from review  of EMR, discussion with primary team, and interview with family, facility staff/caregiver and/or [name].  I reviewed available labs, medications, imaging, studies and related documents from the EMR.  Records reviewed and summarized above.    Physical Exam: Pulse 68, resp 20, b/p 130/74, sats 98% at 3 lpm Constitutional:  NAD General: frail appearing EYES: anicteric sclera, lids intact, no discharge  ENMT: intact hearing, oral mucous membranes moist CV: S1S2, RRR, pedal edema Pulmonary: LCTA, bases slightly diminished. no increased work of breathing, occasional non-productive cough Abdomen: normo-active BS + 4 quadrants, soft and non tender GU: deferred MSK: moves all extremities, ambulatory with walker Skin: warm and dry, no rashes or wounds on visible skin. Peeling to left heel, left open to air Neuro: generalized weakness, A & O x 3, forgetful Psych: non-anxious affect Hem/lymph/immuno: no widespread bruising   Thank you for the opportunity to participate in the care of [name].  The palliative care team will continue to follow. Please call our office at [phone] if we can be of additional assistance.   Ezekiel Slocumb, NP   COVID-19 PATIENT SCREENING TOOL Asked and negative response unless otherwise noted:   Have you had symptoms of covid, tested positive or been in contact with someone with symptoms/positive test in the past 5-10 days? No

## 2021-01-07 ENCOUNTER — Other Ambulatory Visit: Payer: Medicare Other | Admitting: Student

## 2021-01-07 DIAGNOSIS — Z85118 Personal history of other malignant neoplasm of bronchus and lung: Secondary | ICD-10-CM | POA: Diagnosis not present

## 2021-01-07 DIAGNOSIS — I10 Essential (primary) hypertension: Secondary | ICD-10-CM | POA: Diagnosis not present

## 2021-01-07 DIAGNOSIS — J449 Chronic obstructive pulmonary disease, unspecified: Secondary | ICD-10-CM | POA: Diagnosis not present

## 2021-01-07 DIAGNOSIS — Z7984 Long term (current) use of oral hypoglycemic drugs: Secondary | ICD-10-CM | POA: Diagnosis not present

## 2021-01-07 DIAGNOSIS — Z7982 Long term (current) use of aspirin: Secondary | ICD-10-CM | POA: Diagnosis not present

## 2021-01-07 DIAGNOSIS — E785 Hyperlipidemia, unspecified: Secondary | ICD-10-CM | POA: Diagnosis not present

## 2021-01-07 DIAGNOSIS — Z9181 History of falling: Secondary | ICD-10-CM | POA: Diagnosis not present

## 2021-01-07 DIAGNOSIS — Z87891 Personal history of nicotine dependence: Secondary | ICD-10-CM | POA: Diagnosis not present

## 2021-01-07 DIAGNOSIS — Z9981 Dependence on supplemental oxygen: Secondary | ICD-10-CM | POA: Diagnosis not present

## 2021-01-07 DIAGNOSIS — Z8673 Personal history of transient ischemic attack (TIA), and cerebral infarction without residual deficits: Secondary | ICD-10-CM | POA: Diagnosis not present

## 2021-01-07 DIAGNOSIS — H3530 Unspecified macular degeneration: Secondary | ICD-10-CM | POA: Diagnosis not present

## 2021-01-07 DIAGNOSIS — I2510 Atherosclerotic heart disease of native coronary artery without angina pectoris: Secondary | ICD-10-CM | POA: Diagnosis not present

## 2021-01-07 DIAGNOSIS — E1159 Type 2 diabetes mellitus with other circulatory complications: Secondary | ICD-10-CM | POA: Diagnosis not present

## 2021-01-09 DIAGNOSIS — Z9981 Dependence on supplemental oxygen: Secondary | ICD-10-CM | POA: Diagnosis not present

## 2021-01-09 DIAGNOSIS — J449 Chronic obstructive pulmonary disease, unspecified: Secondary | ICD-10-CM | POA: Diagnosis not present

## 2021-01-09 DIAGNOSIS — I10 Essential (primary) hypertension: Secondary | ICD-10-CM | POA: Diagnosis not present

## 2021-01-09 DIAGNOSIS — Z7982 Long term (current) use of aspirin: Secondary | ICD-10-CM | POA: Diagnosis not present

## 2021-01-09 DIAGNOSIS — Z9181 History of falling: Secondary | ICD-10-CM | POA: Diagnosis not present

## 2021-01-09 DIAGNOSIS — I2510 Atherosclerotic heart disease of native coronary artery without angina pectoris: Secondary | ICD-10-CM | POA: Diagnosis not present

## 2021-01-09 DIAGNOSIS — E785 Hyperlipidemia, unspecified: Secondary | ICD-10-CM | POA: Diagnosis not present

## 2021-01-09 DIAGNOSIS — H3530 Unspecified macular degeneration: Secondary | ICD-10-CM | POA: Diagnosis not present

## 2021-01-09 DIAGNOSIS — Z8673 Personal history of transient ischemic attack (TIA), and cerebral infarction without residual deficits: Secondary | ICD-10-CM | POA: Diagnosis not present

## 2021-01-09 DIAGNOSIS — Z7984 Long term (current) use of oral hypoglycemic drugs: Secondary | ICD-10-CM | POA: Diagnosis not present

## 2021-01-09 DIAGNOSIS — Z85118 Personal history of other malignant neoplasm of bronchus and lung: Secondary | ICD-10-CM | POA: Diagnosis not present

## 2021-01-09 DIAGNOSIS — Z87891 Personal history of nicotine dependence: Secondary | ICD-10-CM | POA: Diagnosis not present

## 2021-01-09 DIAGNOSIS — E1159 Type 2 diabetes mellitus with other circulatory complications: Secondary | ICD-10-CM | POA: Diagnosis not present

## 2021-01-10 DIAGNOSIS — Z9181 History of falling: Secondary | ICD-10-CM | POA: Diagnosis not present

## 2021-01-10 DIAGNOSIS — Z7984 Long term (current) use of oral hypoglycemic drugs: Secondary | ICD-10-CM | POA: Diagnosis not present

## 2021-01-10 DIAGNOSIS — Z8673 Personal history of transient ischemic attack (TIA), and cerebral infarction without residual deficits: Secondary | ICD-10-CM | POA: Diagnosis not present

## 2021-01-10 DIAGNOSIS — E1159 Type 2 diabetes mellitus with other circulatory complications: Secondary | ICD-10-CM | POA: Diagnosis not present

## 2021-01-10 DIAGNOSIS — Z87891 Personal history of nicotine dependence: Secondary | ICD-10-CM | POA: Diagnosis not present

## 2021-01-10 DIAGNOSIS — Z9981 Dependence on supplemental oxygen: Secondary | ICD-10-CM | POA: Diagnosis not present

## 2021-01-10 DIAGNOSIS — H3530 Unspecified macular degeneration: Secondary | ICD-10-CM | POA: Diagnosis not present

## 2021-01-10 DIAGNOSIS — Z85118 Personal history of other malignant neoplasm of bronchus and lung: Secondary | ICD-10-CM | POA: Diagnosis not present

## 2021-01-10 DIAGNOSIS — J449 Chronic obstructive pulmonary disease, unspecified: Secondary | ICD-10-CM | POA: Diagnosis not present

## 2021-01-10 DIAGNOSIS — E785 Hyperlipidemia, unspecified: Secondary | ICD-10-CM | POA: Diagnosis not present

## 2021-01-10 DIAGNOSIS — Z7982 Long term (current) use of aspirin: Secondary | ICD-10-CM | POA: Diagnosis not present

## 2021-01-10 DIAGNOSIS — I10 Essential (primary) hypertension: Secondary | ICD-10-CM | POA: Diagnosis not present

## 2021-01-10 DIAGNOSIS — I2510 Atherosclerotic heart disease of native coronary artery without angina pectoris: Secondary | ICD-10-CM | POA: Diagnosis not present

## 2021-01-16 DIAGNOSIS — J449 Chronic obstructive pulmonary disease, unspecified: Secondary | ICD-10-CM | POA: Diagnosis not present

## 2021-01-16 DIAGNOSIS — E1159 Type 2 diabetes mellitus with other circulatory complications: Secondary | ICD-10-CM | POA: Diagnosis not present

## 2021-01-16 DIAGNOSIS — I2510 Atherosclerotic heart disease of native coronary artery without angina pectoris: Secondary | ICD-10-CM | POA: Diagnosis not present

## 2021-01-16 DIAGNOSIS — Z7982 Long term (current) use of aspirin: Secondary | ICD-10-CM | POA: Diagnosis not present

## 2021-01-16 DIAGNOSIS — Z85118 Personal history of other malignant neoplasm of bronchus and lung: Secondary | ICD-10-CM | POA: Diagnosis not present

## 2021-01-16 DIAGNOSIS — H3530 Unspecified macular degeneration: Secondary | ICD-10-CM | POA: Diagnosis not present

## 2021-01-16 DIAGNOSIS — E785 Hyperlipidemia, unspecified: Secondary | ICD-10-CM | POA: Diagnosis not present

## 2021-01-16 DIAGNOSIS — Z7984 Long term (current) use of oral hypoglycemic drugs: Secondary | ICD-10-CM | POA: Diagnosis not present

## 2021-01-16 DIAGNOSIS — Z87891 Personal history of nicotine dependence: Secondary | ICD-10-CM | POA: Diagnosis not present

## 2021-01-16 DIAGNOSIS — Z8673 Personal history of transient ischemic attack (TIA), and cerebral infarction without residual deficits: Secondary | ICD-10-CM | POA: Diagnosis not present

## 2021-01-16 DIAGNOSIS — I10 Essential (primary) hypertension: Secondary | ICD-10-CM | POA: Diagnosis not present

## 2021-01-16 DIAGNOSIS — Z9981 Dependence on supplemental oxygen: Secondary | ICD-10-CM | POA: Diagnosis not present

## 2021-01-16 DIAGNOSIS — Z9181 History of falling: Secondary | ICD-10-CM | POA: Diagnosis not present

## 2021-01-17 ENCOUNTER — Telehealth: Payer: Self-pay

## 2021-01-17 ENCOUNTER — Ambulatory Visit: Payer: Self-pay

## 2021-01-17 DIAGNOSIS — I2510 Atherosclerotic heart disease of native coronary artery without angina pectoris: Secondary | ICD-10-CM | POA: Diagnosis not present

## 2021-01-17 DIAGNOSIS — Z7982 Long term (current) use of aspirin: Secondary | ICD-10-CM | POA: Diagnosis not present

## 2021-01-17 DIAGNOSIS — E785 Hyperlipidemia, unspecified: Secondary | ICD-10-CM | POA: Diagnosis not present

## 2021-01-17 DIAGNOSIS — J449 Chronic obstructive pulmonary disease, unspecified: Secondary | ICD-10-CM | POA: Diagnosis not present

## 2021-01-17 DIAGNOSIS — Z87891 Personal history of nicotine dependence: Secondary | ICD-10-CM | POA: Diagnosis not present

## 2021-01-17 DIAGNOSIS — E1159 Type 2 diabetes mellitus with other circulatory complications: Secondary | ICD-10-CM | POA: Diagnosis not present

## 2021-01-17 DIAGNOSIS — Z8673 Personal history of transient ischemic attack (TIA), and cerebral infarction without residual deficits: Secondary | ICD-10-CM | POA: Diagnosis not present

## 2021-01-17 DIAGNOSIS — Z85118 Personal history of other malignant neoplasm of bronchus and lung: Secondary | ICD-10-CM | POA: Diagnosis not present

## 2021-01-17 DIAGNOSIS — R059 Cough, unspecified: Secondary | ICD-10-CM

## 2021-01-17 DIAGNOSIS — Z9981 Dependence on supplemental oxygen: Secondary | ICD-10-CM | POA: Diagnosis not present

## 2021-01-17 DIAGNOSIS — I10 Essential (primary) hypertension: Secondary | ICD-10-CM | POA: Diagnosis not present

## 2021-01-17 DIAGNOSIS — Z7984 Long term (current) use of oral hypoglycemic drugs: Secondary | ICD-10-CM | POA: Diagnosis not present

## 2021-01-17 DIAGNOSIS — Z9181 History of falling: Secondary | ICD-10-CM | POA: Diagnosis not present

## 2021-01-17 DIAGNOSIS — H3530 Unspecified macular degeneration: Secondary | ICD-10-CM | POA: Diagnosis not present

## 2021-01-17 MED ORDER — BENZONATATE 100 MG PO CAPS: 200.0000 mg | ORAL_CAPSULE | Freq: Three times a day (TID) | ORAL | 2 refills | Status: AC | PRN

## 2021-01-17 NOTE — Telephone Encounter (Signed)
Please review for Dr. Rosanna Randy.    Mr. [name] [name] called to report pt has a chronic cough since having covid in May.  He is requesting a refill on Tessalon Perles be sent to [facility] [place].   Thanks,   -Mickel Baas

## 2021-01-17 NOTE — Telephone Encounter (Signed)
Pts husband called in stating pt is having a really bad cough and wants to speak with something to about getting that taken care, please advise.   Request for refill of Ladona Ridgel has already gone to the practice and is being addressed. Husband notified.  Answer Assessment - Initial Assessment Questions 1. DRUG NAME: "What medicine do you need to have refilled?"     Tessalon perles 2. REFILLS REMAINING: "How many refills are remaining?" (Note: The label on the medicine or pill bottle will show how many refills are remaining. If there are no refills remaining, then a renewal may be needed.)     0 3. EXPIRATION DATE: "What is the expiration date?" (Note: The label states when the prescription will expire, and thus can no longer be refilled.)     N/a 4. PRESCRIBING HCP: "Who prescribed it?" Reason: If prescribed by specialist, call should be referred to that group.     Dr. Rosanna Randy 5. SYMPTOMS: "Do you have any symptoms?"     Cough 6. PREGNANCY: "Is there any chance that you are pregnant?" "When was your last menstrual period?"     No  Protocols used: Medication Refill and Renewal Call-A-AH

## 2021-01-21 ENCOUNTER — Other Ambulatory Visit: Payer: Self-pay | Admitting: Adult Health

## 2021-01-21 DIAGNOSIS — Z85118 Personal history of other malignant neoplasm of bronchus and lung: Secondary | ICD-10-CM | POA: Diagnosis not present

## 2021-01-21 DIAGNOSIS — Z7982 Long term (current) use of aspirin: Secondary | ICD-10-CM | POA: Diagnosis not present

## 2021-01-21 DIAGNOSIS — Z87891 Personal history of nicotine dependence: Secondary | ICD-10-CM | POA: Diagnosis not present

## 2021-01-21 DIAGNOSIS — Z7984 Long term (current) use of oral hypoglycemic drugs: Secondary | ICD-10-CM | POA: Diagnosis not present

## 2021-01-21 DIAGNOSIS — E1159 Type 2 diabetes mellitus with other circulatory complications: Secondary | ICD-10-CM | POA: Diagnosis not present

## 2021-01-21 DIAGNOSIS — Z9181 History of falling: Secondary | ICD-10-CM | POA: Diagnosis not present

## 2021-01-21 DIAGNOSIS — E785 Hyperlipidemia, unspecified: Secondary | ICD-10-CM | POA: Diagnosis not present

## 2021-01-21 DIAGNOSIS — H3530 Unspecified macular degeneration: Secondary | ICD-10-CM | POA: Diagnosis not present

## 2021-01-21 DIAGNOSIS — Z8673 Personal history of transient ischemic attack (TIA), and cerebral infarction without residual deficits: Secondary | ICD-10-CM | POA: Diagnosis not present

## 2021-01-21 DIAGNOSIS — Z9981 Dependence on supplemental oxygen: Secondary | ICD-10-CM | POA: Diagnosis not present

## 2021-01-21 DIAGNOSIS — I10 Essential (primary) hypertension: Secondary | ICD-10-CM | POA: Diagnosis not present

## 2021-01-21 DIAGNOSIS — J449 Chronic obstructive pulmonary disease, unspecified: Secondary | ICD-10-CM | POA: Diagnosis not present

## 2021-01-21 DIAGNOSIS — I2510 Atherosclerotic heart disease of native coronary artery without angina pectoris: Secondary | ICD-10-CM | POA: Diagnosis not present

## 2021-01-22 ENCOUNTER — Ambulatory Visit: Payer: Medicare Other | Admitting: Family Medicine

## 2021-01-23 DIAGNOSIS — R0602 Shortness of breath: Secondary | ICD-10-CM | POA: Diagnosis not present

## 2021-01-23 DIAGNOSIS — J449 Chronic obstructive pulmonary disease, unspecified: Secondary | ICD-10-CM | POA: Diagnosis not present

## 2021-01-24 DIAGNOSIS — Z9981 Dependence on supplemental oxygen: Secondary | ICD-10-CM | POA: Diagnosis not present

## 2021-01-24 DIAGNOSIS — Z9181 History of falling: Secondary | ICD-10-CM | POA: Diagnosis not present

## 2021-01-24 DIAGNOSIS — E785 Hyperlipidemia, unspecified: Secondary | ICD-10-CM | POA: Diagnosis not present

## 2021-01-24 DIAGNOSIS — H3530 Unspecified macular degeneration: Secondary | ICD-10-CM | POA: Diagnosis not present

## 2021-01-24 DIAGNOSIS — E1159 Type 2 diabetes mellitus with other circulatory complications: Secondary | ICD-10-CM | POA: Diagnosis not present

## 2021-01-24 DIAGNOSIS — J449 Chronic obstructive pulmonary disease, unspecified: Secondary | ICD-10-CM | POA: Diagnosis not present

## 2021-01-24 DIAGNOSIS — Z8673 Personal history of transient ischemic attack (TIA), and cerebral infarction without residual deficits: Secondary | ICD-10-CM | POA: Diagnosis not present

## 2021-01-24 DIAGNOSIS — I10 Essential (primary) hypertension: Secondary | ICD-10-CM | POA: Diagnosis not present

## 2021-01-24 DIAGNOSIS — Z7984 Long term (current) use of oral hypoglycemic drugs: Secondary | ICD-10-CM | POA: Diagnosis not present

## 2021-01-24 DIAGNOSIS — Z85118 Personal history of other malignant neoplasm of bronchus and lung: Secondary | ICD-10-CM | POA: Diagnosis not present

## 2021-01-24 DIAGNOSIS — Z87891 Personal history of nicotine dependence: Secondary | ICD-10-CM | POA: Diagnosis not present

## 2021-01-24 DIAGNOSIS — Z7982 Long term (current) use of aspirin: Secondary | ICD-10-CM | POA: Diagnosis not present

## 2021-01-24 DIAGNOSIS — I2510 Atherosclerotic heart disease of native coronary artery without angina pectoris: Secondary | ICD-10-CM | POA: Diagnosis not present

## 2021-01-25 DIAGNOSIS — J449 Chronic obstructive pulmonary disease, unspecified: Secondary | ICD-10-CM | POA: Diagnosis not present

## 2021-01-29 ENCOUNTER — Other Ambulatory Visit: Payer: Self-pay | Admitting: Family Medicine

## 2021-01-29 DIAGNOSIS — Z85118 Personal history of other malignant neoplasm of bronchus and lung: Secondary | ICD-10-CM | POA: Diagnosis not present

## 2021-01-29 DIAGNOSIS — Z9981 Dependence on supplemental oxygen: Secondary | ICD-10-CM | POA: Diagnosis not present

## 2021-01-29 DIAGNOSIS — I2510 Atherosclerotic heart disease of native coronary artery without angina pectoris: Secondary | ICD-10-CM | POA: Diagnosis not present

## 2021-01-29 DIAGNOSIS — I10 Essential (primary) hypertension: Secondary | ICD-10-CM

## 2021-01-29 DIAGNOSIS — Z87891 Personal history of nicotine dependence: Secondary | ICD-10-CM | POA: Diagnosis not present

## 2021-01-29 DIAGNOSIS — E785 Hyperlipidemia, unspecified: Secondary | ICD-10-CM | POA: Diagnosis not present

## 2021-01-29 DIAGNOSIS — Z7982 Long term (current) use of aspirin: Secondary | ICD-10-CM | POA: Diagnosis not present

## 2021-01-29 DIAGNOSIS — Z9181 History of falling: Secondary | ICD-10-CM | POA: Diagnosis not present

## 2021-01-29 DIAGNOSIS — Z7984 Long term (current) use of oral hypoglycemic drugs: Secondary | ICD-10-CM | POA: Diagnosis not present

## 2021-01-29 DIAGNOSIS — J449 Chronic obstructive pulmonary disease, unspecified: Secondary | ICD-10-CM | POA: Diagnosis not present

## 2021-01-29 DIAGNOSIS — H3530 Unspecified macular degeneration: Secondary | ICD-10-CM | POA: Diagnosis not present

## 2021-01-29 DIAGNOSIS — Z8673 Personal history of transient ischemic attack (TIA), and cerebral infarction without residual deficits: Secondary | ICD-10-CM | POA: Diagnosis not present

## 2021-01-29 DIAGNOSIS — E1159 Type 2 diabetes mellitus with other circulatory complications: Secondary | ICD-10-CM | POA: Diagnosis not present

## 2021-01-30 DIAGNOSIS — Z9181 History of falling: Secondary | ICD-10-CM | POA: Diagnosis not present

## 2021-01-30 DIAGNOSIS — Z85118 Personal history of other malignant neoplasm of bronchus and lung: Secondary | ICD-10-CM | POA: Diagnosis not present

## 2021-01-30 DIAGNOSIS — Z7984 Long term (current) use of oral hypoglycemic drugs: Secondary | ICD-10-CM | POA: Diagnosis not present

## 2021-01-30 DIAGNOSIS — Z9981 Dependence on supplemental oxygen: Secondary | ICD-10-CM | POA: Diagnosis not present

## 2021-01-30 DIAGNOSIS — E1159 Type 2 diabetes mellitus with other circulatory complications: Secondary | ICD-10-CM | POA: Diagnosis not present

## 2021-01-30 DIAGNOSIS — H3530 Unspecified macular degeneration: Secondary | ICD-10-CM | POA: Diagnosis not present

## 2021-01-30 DIAGNOSIS — I2510 Atherosclerotic heart disease of native coronary artery without angina pectoris: Secondary | ICD-10-CM | POA: Diagnosis not present

## 2021-01-30 DIAGNOSIS — Z87891 Personal history of nicotine dependence: Secondary | ICD-10-CM | POA: Diagnosis not present

## 2021-01-30 DIAGNOSIS — E785 Hyperlipidemia, unspecified: Secondary | ICD-10-CM | POA: Diagnosis not present

## 2021-01-30 DIAGNOSIS — I10 Essential (primary) hypertension: Secondary | ICD-10-CM | POA: Diagnosis not present

## 2021-01-30 DIAGNOSIS — J449 Chronic obstructive pulmonary disease, unspecified: Secondary | ICD-10-CM | POA: Diagnosis not present

## 2021-01-30 DIAGNOSIS — Z8673 Personal history of transient ischemic attack (TIA), and cerebral infarction without residual deficits: Secondary | ICD-10-CM | POA: Diagnosis not present

## 2021-01-30 DIAGNOSIS — Z7982 Long term (current) use of aspirin: Secondary | ICD-10-CM | POA: Diagnosis not present

## 2021-01-30 NOTE — Telephone Encounter (Signed)
Requested Prescriptions  Pending Prescriptions Disp Refills  . amLODipine (NORVASC) 5 MG tablet [Pharmacy Med Name: AMLODIPINE BESYLATE 5 MG TAB] 90 tablet     Sig: TAKE 1 TABLET BY MOUTH EVERY DAY     Cardiovascular:  Calcium Channel Blockers Passed - 01/29/2021  2:37 PM      Passed - Last BP in normal range    BP Readings from Last 1 Encounters:  12/19/20 119/62         Passed - Valid encounter within last 6 months    Recent Outpatient Visits          1 month ago Need for influenza vaccination   [facility] Jerrol Banana., MD   4 months ago Cellulitis of finger, unspecified laterality   [facility] Jerrol Banana., MD   5 months ago [place] Rosanna Randy, Retia Passe., MD   6 months ago Skin ulcer of sacrum, limited to breakdown of skin [facility])   [facility] Carles Collet [place], [place]   9 months ago Encounter for immunization   [facility] Jerrol Banana., MD      Future Appointments            In 2 months Jerrol Banana., MD [facility], [place]

## 2021-02-05 ENCOUNTER — Telehealth: Payer: Self-pay | Admitting: Family Medicine

## 2021-02-05 DIAGNOSIS — H3530 Unspecified macular degeneration: Secondary | ICD-10-CM | POA: Diagnosis not present

## 2021-02-05 DIAGNOSIS — Z9981 Dependence on supplemental oxygen: Secondary | ICD-10-CM | POA: Diagnosis not present

## 2021-02-05 DIAGNOSIS — E785 Hyperlipidemia, unspecified: Secondary | ICD-10-CM | POA: Diagnosis not present

## 2021-02-05 DIAGNOSIS — Z85118 Personal history of other malignant neoplasm of bronchus and lung: Secondary | ICD-10-CM | POA: Diagnosis not present

## 2021-02-05 DIAGNOSIS — Z9181 History of falling: Secondary | ICD-10-CM | POA: Diagnosis not present

## 2021-02-05 DIAGNOSIS — Z7982 Long term (current) use of aspirin: Secondary | ICD-10-CM | POA: Diagnosis not present

## 2021-02-05 DIAGNOSIS — E1159 Type 2 diabetes mellitus with other circulatory complications: Secondary | ICD-10-CM | POA: Diagnosis not present

## 2021-02-05 DIAGNOSIS — Z8673 Personal history of transient ischemic attack (TIA), and cerebral infarction without residual deficits: Secondary | ICD-10-CM | POA: Diagnosis not present

## 2021-02-05 DIAGNOSIS — Z87891 Personal history of nicotine dependence: Secondary | ICD-10-CM | POA: Diagnosis not present

## 2021-02-05 DIAGNOSIS — Z7984 Long term (current) use of oral hypoglycemic drugs: Secondary | ICD-10-CM | POA: Diagnosis not present

## 2021-02-05 DIAGNOSIS — I2510 Atherosclerotic heart disease of native coronary artery without angina pectoris: Secondary | ICD-10-CM | POA: Diagnosis not present

## 2021-02-05 DIAGNOSIS — I10 Essential (primary) hypertension: Secondary | ICD-10-CM | POA: Diagnosis not present

## 2021-02-05 DIAGNOSIS — J449 Chronic obstructive pulmonary disease, unspecified: Secondary | ICD-10-CM | POA: Diagnosis not present

## 2021-02-05 NOTE — Telephone Encounter (Signed)
Home Health Verbal Orders - Caller/Agency: Araceli Bouche / [facility] Callback Number: [phone] Requesting PT 1 wk 9  Ok to leave message

## 2021-02-05 NOTE — Telephone Encounter (Signed)
Please advise 

## 2021-02-05 NOTE — Telephone Encounter (Signed)
Left detailed message on Gordan's vm giving verbal okay.

## 2021-02-05 NOTE — Telephone Encounter (Signed)
Home Health Verbal Orders - Caller/Agency: malorie/[facility] Callback Number: [phone] Requesting OT/PT/Skilled Nursing/Social Work/Speech Therapy:  OT, home health Aid and Social wok Frequency: OT-1x4/ home health aid 2x4 and social work-evaluation

## 2021-02-06 DIAGNOSIS — J9611 Chronic respiratory failure with hypoxia: Secondary | ICD-10-CM | POA: Diagnosis not present

## 2021-02-06 DIAGNOSIS — Z9181 History of falling: Secondary | ICD-10-CM | POA: Diagnosis not present

## 2021-02-06 DIAGNOSIS — J432 Centrilobular emphysema: Secondary | ICD-10-CM | POA: Diagnosis not present

## 2021-02-06 NOTE — Telephone Encounter (Signed)
Left message advising [name] [name].   Thanks,   -Mickel Baas

## 2021-02-08 ENCOUNTER — Other Ambulatory Visit: Payer: Self-pay | Admitting: Adult Health

## 2021-02-08 DIAGNOSIS — I2510 Atherosclerotic heart disease of native coronary artery without angina pectoris: Secondary | ICD-10-CM

## 2021-02-11 DIAGNOSIS — J449 Chronic obstructive pulmonary disease, unspecified: Secondary | ICD-10-CM | POA: Diagnosis not present

## 2021-02-11 DIAGNOSIS — Z87891 Personal history of nicotine dependence: Secondary | ICD-10-CM | POA: Diagnosis not present

## 2021-02-11 DIAGNOSIS — E1159 Type 2 diabetes mellitus with other circulatory complications: Secondary | ICD-10-CM | POA: Diagnosis not present

## 2021-02-11 DIAGNOSIS — Z9181 History of falling: Secondary | ICD-10-CM | POA: Diagnosis not present

## 2021-02-11 DIAGNOSIS — Z8673 Personal history of transient ischemic attack (TIA), and cerebral infarction without residual deficits: Secondary | ICD-10-CM | POA: Diagnosis not present

## 2021-02-11 DIAGNOSIS — Z7982 Long term (current) use of aspirin: Secondary | ICD-10-CM | POA: Diagnosis not present

## 2021-02-11 DIAGNOSIS — Z9981 Dependence on supplemental oxygen: Secondary | ICD-10-CM | POA: Diagnosis not present

## 2021-02-11 DIAGNOSIS — H3530 Unspecified macular degeneration: Secondary | ICD-10-CM | POA: Diagnosis not present

## 2021-02-11 DIAGNOSIS — I10 Essential (primary) hypertension: Secondary | ICD-10-CM | POA: Diagnosis not present

## 2021-02-11 DIAGNOSIS — Z85118 Personal history of other malignant neoplasm of bronchus and lung: Secondary | ICD-10-CM | POA: Diagnosis not present

## 2021-02-11 DIAGNOSIS — I2510 Atherosclerotic heart disease of native coronary artery without angina pectoris: Secondary | ICD-10-CM | POA: Diagnosis not present

## 2021-02-11 DIAGNOSIS — Z7984 Long term (current) use of oral hypoglycemic drugs: Secondary | ICD-10-CM | POA: Diagnosis not present

## 2021-02-11 DIAGNOSIS — E785 Hyperlipidemia, unspecified: Secondary | ICD-10-CM | POA: Diagnosis not present

## 2021-02-12 DIAGNOSIS — J449 Chronic obstructive pulmonary disease, unspecified: Secondary | ICD-10-CM | POA: Diagnosis not present

## 2021-02-12 DIAGNOSIS — Z9181 History of falling: Secondary | ICD-10-CM | POA: Diagnosis not present

## 2021-02-12 DIAGNOSIS — E785 Hyperlipidemia, unspecified: Secondary | ICD-10-CM | POA: Diagnosis not present

## 2021-02-12 DIAGNOSIS — H3530 Unspecified macular degeneration: Secondary | ICD-10-CM | POA: Diagnosis not present

## 2021-02-12 DIAGNOSIS — Z7982 Long term (current) use of aspirin: Secondary | ICD-10-CM | POA: Diagnosis not present

## 2021-02-12 DIAGNOSIS — I10 Essential (primary) hypertension: Secondary | ICD-10-CM | POA: Diagnosis not present

## 2021-02-12 DIAGNOSIS — Z8673 Personal history of transient ischemic attack (TIA), and cerebral infarction without residual deficits: Secondary | ICD-10-CM | POA: Diagnosis not present

## 2021-02-12 DIAGNOSIS — Z7984 Long term (current) use of oral hypoglycemic drugs: Secondary | ICD-10-CM | POA: Diagnosis not present

## 2021-02-12 DIAGNOSIS — E1159 Type 2 diabetes mellitus with other circulatory complications: Secondary | ICD-10-CM | POA: Diagnosis not present

## 2021-02-12 DIAGNOSIS — Z87891 Personal history of nicotine dependence: Secondary | ICD-10-CM | POA: Diagnosis not present

## 2021-02-12 DIAGNOSIS — Z85118 Personal history of other malignant neoplasm of bronchus and lung: Secondary | ICD-10-CM | POA: Diagnosis not present

## 2021-02-12 DIAGNOSIS — Z9981 Dependence on supplemental oxygen: Secondary | ICD-10-CM | POA: Diagnosis not present

## 2021-02-12 DIAGNOSIS — I2510 Atherosclerotic heart disease of native coronary artery without angina pectoris: Secondary | ICD-10-CM | POA: Diagnosis not present

## 2021-02-13 DIAGNOSIS — Z7982 Long term (current) use of aspirin: Secondary | ICD-10-CM | POA: Diagnosis not present

## 2021-02-13 DIAGNOSIS — Z7984 Long term (current) use of oral hypoglycemic drugs: Secondary | ICD-10-CM | POA: Diagnosis not present

## 2021-02-13 DIAGNOSIS — J449 Chronic obstructive pulmonary disease, unspecified: Secondary | ICD-10-CM | POA: Diagnosis not present

## 2021-02-13 DIAGNOSIS — I10 Essential (primary) hypertension: Secondary | ICD-10-CM | POA: Diagnosis not present

## 2021-02-13 DIAGNOSIS — E785 Hyperlipidemia, unspecified: Secondary | ICD-10-CM | POA: Diagnosis not present

## 2021-02-13 DIAGNOSIS — Z8673 Personal history of transient ischemic attack (TIA), and cerebral infarction without residual deficits: Secondary | ICD-10-CM | POA: Diagnosis not present

## 2021-02-13 DIAGNOSIS — Z9181 History of falling: Secondary | ICD-10-CM | POA: Diagnosis not present

## 2021-02-13 DIAGNOSIS — Z87891 Personal history of nicotine dependence: Secondary | ICD-10-CM | POA: Diagnosis not present

## 2021-02-13 DIAGNOSIS — Z9981 Dependence on supplemental oxygen: Secondary | ICD-10-CM | POA: Diagnosis not present

## 2021-02-13 DIAGNOSIS — E1159 Type 2 diabetes mellitus with other circulatory complications: Secondary | ICD-10-CM | POA: Diagnosis not present

## 2021-02-13 DIAGNOSIS — Z85118 Personal history of other malignant neoplasm of bronchus and lung: Secondary | ICD-10-CM | POA: Diagnosis not present

## 2021-02-13 DIAGNOSIS — I2510 Atherosclerotic heart disease of native coronary artery without angina pectoris: Secondary | ICD-10-CM | POA: Diagnosis not present

## 2021-02-13 DIAGNOSIS — H3530 Unspecified macular degeneration: Secondary | ICD-10-CM | POA: Diagnosis not present

## 2021-02-17 DIAGNOSIS — I10 Essential (primary) hypertension: Secondary | ICD-10-CM | POA: Diagnosis not present

## 2021-02-17 DIAGNOSIS — E1159 Type 2 diabetes mellitus with other circulatory complications: Secondary | ICD-10-CM | POA: Diagnosis not present

## 2021-02-17 DIAGNOSIS — Z7982 Long term (current) use of aspirin: Secondary | ICD-10-CM | POA: Diagnosis not present

## 2021-02-17 DIAGNOSIS — Z9181 History of falling: Secondary | ICD-10-CM | POA: Diagnosis not present

## 2021-02-17 DIAGNOSIS — J449 Chronic obstructive pulmonary disease, unspecified: Secondary | ICD-10-CM | POA: Diagnosis not present

## 2021-02-17 DIAGNOSIS — Z8673 Personal history of transient ischemic attack (TIA), and cerebral infarction without residual deficits: Secondary | ICD-10-CM | POA: Diagnosis not present

## 2021-02-17 DIAGNOSIS — Z9981 Dependence on supplemental oxygen: Secondary | ICD-10-CM | POA: Diagnosis not present

## 2021-02-17 DIAGNOSIS — H3530 Unspecified macular degeneration: Secondary | ICD-10-CM | POA: Diagnosis not present

## 2021-02-17 DIAGNOSIS — Z7984 Long term (current) use of oral hypoglycemic drugs: Secondary | ICD-10-CM | POA: Diagnosis not present

## 2021-02-17 DIAGNOSIS — E785 Hyperlipidemia, unspecified: Secondary | ICD-10-CM | POA: Diagnosis not present

## 2021-02-17 DIAGNOSIS — Z87891 Personal history of nicotine dependence: Secondary | ICD-10-CM | POA: Diagnosis not present

## 2021-02-17 DIAGNOSIS — Z85118 Personal history of other malignant neoplasm of bronchus and lung: Secondary | ICD-10-CM | POA: Diagnosis not present

## 2021-02-17 DIAGNOSIS — I2510 Atherosclerotic heart disease of native coronary artery without angina pectoris: Secondary | ICD-10-CM | POA: Diagnosis not present

## 2021-02-18 DIAGNOSIS — I10 Essential (primary) hypertension: Secondary | ICD-10-CM | POA: Diagnosis not present

## 2021-02-18 DIAGNOSIS — Z7984 Long term (current) use of oral hypoglycemic drugs: Secondary | ICD-10-CM | POA: Diagnosis not present

## 2021-02-18 DIAGNOSIS — Z85118 Personal history of other malignant neoplasm of bronchus and lung: Secondary | ICD-10-CM | POA: Diagnosis not present

## 2021-02-18 DIAGNOSIS — Z87891 Personal history of nicotine dependence: Secondary | ICD-10-CM | POA: Diagnosis not present

## 2021-02-18 DIAGNOSIS — Z7982 Long term (current) use of aspirin: Secondary | ICD-10-CM | POA: Diagnosis not present

## 2021-02-18 DIAGNOSIS — I2510 Atherosclerotic heart disease of native coronary artery without angina pectoris: Secondary | ICD-10-CM | POA: Diagnosis not present

## 2021-02-18 DIAGNOSIS — J449 Chronic obstructive pulmonary disease, unspecified: Secondary | ICD-10-CM | POA: Diagnosis not present

## 2021-02-18 DIAGNOSIS — E785 Hyperlipidemia, unspecified: Secondary | ICD-10-CM | POA: Diagnosis not present

## 2021-02-18 DIAGNOSIS — Z8673 Personal history of transient ischemic attack (TIA), and cerebral infarction without residual deficits: Secondary | ICD-10-CM | POA: Diagnosis not present

## 2021-02-18 DIAGNOSIS — E1159 Type 2 diabetes mellitus with other circulatory complications: Secondary | ICD-10-CM | POA: Diagnosis not present

## 2021-02-18 DIAGNOSIS — H3530 Unspecified macular degeneration: Secondary | ICD-10-CM | POA: Diagnosis not present

## 2021-02-18 DIAGNOSIS — Z9181 History of falling: Secondary | ICD-10-CM | POA: Diagnosis not present

## 2021-02-18 DIAGNOSIS — Z9981 Dependence on supplemental oxygen: Secondary | ICD-10-CM | POA: Diagnosis not present

## 2021-02-19 ENCOUNTER — Telehealth: Payer: Self-pay

## 2021-02-19 DIAGNOSIS — I10 Essential (primary) hypertension: Secondary | ICD-10-CM | POA: Diagnosis not present

## 2021-02-19 DIAGNOSIS — Z8673 Personal history of transient ischemic attack (TIA), and cerebral infarction without residual deficits: Secondary | ICD-10-CM | POA: Diagnosis not present

## 2021-02-19 DIAGNOSIS — H3530 Unspecified macular degeneration: Secondary | ICD-10-CM | POA: Diagnosis not present

## 2021-02-19 DIAGNOSIS — Z7984 Long term (current) use of oral hypoglycemic drugs: Secondary | ICD-10-CM | POA: Diagnosis not present

## 2021-02-19 DIAGNOSIS — E785 Hyperlipidemia, unspecified: Secondary | ICD-10-CM | POA: Diagnosis not present

## 2021-02-19 DIAGNOSIS — Z87891 Personal history of nicotine dependence: Secondary | ICD-10-CM | POA: Diagnosis not present

## 2021-02-19 DIAGNOSIS — Z7982 Long term (current) use of aspirin: Secondary | ICD-10-CM | POA: Diagnosis not present

## 2021-02-19 DIAGNOSIS — Z85118 Personal history of other malignant neoplasm of bronchus and lung: Secondary | ICD-10-CM | POA: Diagnosis not present

## 2021-02-19 DIAGNOSIS — E1159 Type 2 diabetes mellitus with other circulatory complications: Secondary | ICD-10-CM | POA: Diagnosis not present

## 2021-02-19 DIAGNOSIS — Z9981 Dependence on supplemental oxygen: Secondary | ICD-10-CM | POA: Diagnosis not present

## 2021-02-19 DIAGNOSIS — I2510 Atherosclerotic heart disease of native coronary artery without angina pectoris: Secondary | ICD-10-CM | POA: Diagnosis not present

## 2021-02-19 DIAGNOSIS — J449 Chronic obstructive pulmonary disease, unspecified: Secondary | ICD-10-CM | POA: Diagnosis not present

## 2021-02-19 DIAGNOSIS — Z9181 History of falling: Secondary | ICD-10-CM | POA: Diagnosis not present

## 2021-02-19 NOTE — Telephone Encounter (Signed)
Copied from [place] [phone]. Topic: General - Other >> Feb 19, 2021  3:38 PM Valere Dross wrote: Reason for CRM: Pts husband called in on behalf of pt stating they may have been exposed to covid and wanted to see about getting tested, please advise.

## 2021-02-20 NOTE — Telephone Encounter (Signed)
[name] [name] tried calling pt's husband, no answer and no vm.

## 2021-02-20 NOTE — Telephone Encounter (Signed)
Returned call to patient. No answer and no vm.

## 2021-02-21 ENCOUNTER — Other Ambulatory Visit: Payer: Self-pay | Admitting: Family Medicine

## 2021-02-21 DIAGNOSIS — Z7982 Long term (current) use of aspirin: Secondary | ICD-10-CM | POA: Diagnosis not present

## 2021-02-21 DIAGNOSIS — H3530 Unspecified macular degeneration: Secondary | ICD-10-CM | POA: Diagnosis not present

## 2021-02-21 DIAGNOSIS — J449 Chronic obstructive pulmonary disease, unspecified: Secondary | ICD-10-CM | POA: Diagnosis not present

## 2021-02-21 DIAGNOSIS — I2510 Atherosclerotic heart disease of native coronary artery without angina pectoris: Secondary | ICD-10-CM | POA: Diagnosis not present

## 2021-02-21 DIAGNOSIS — Z85118 Personal history of other malignant neoplasm of bronchus and lung: Secondary | ICD-10-CM | POA: Diagnosis not present

## 2021-02-21 DIAGNOSIS — Z9181 History of falling: Secondary | ICD-10-CM | POA: Diagnosis not present

## 2021-02-21 DIAGNOSIS — E1159 Type 2 diabetes mellitus with other circulatory complications: Secondary | ICD-10-CM | POA: Diagnosis not present

## 2021-02-21 DIAGNOSIS — Z9981 Dependence on supplemental oxygen: Secondary | ICD-10-CM | POA: Diagnosis not present

## 2021-02-21 DIAGNOSIS — Z87891 Personal history of nicotine dependence: Secondary | ICD-10-CM | POA: Diagnosis not present

## 2021-02-21 DIAGNOSIS — I10 Essential (primary) hypertension: Secondary | ICD-10-CM | POA: Diagnosis not present

## 2021-02-21 DIAGNOSIS — Z8673 Personal history of transient ischemic attack (TIA), and cerebral infarction without residual deficits: Secondary | ICD-10-CM | POA: Diagnosis not present

## 2021-02-21 DIAGNOSIS — E785 Hyperlipidemia, unspecified: Secondary | ICD-10-CM | POA: Diagnosis not present

## 2021-02-21 DIAGNOSIS — Z7984 Long term (current) use of oral hypoglycemic drugs: Secondary | ICD-10-CM | POA: Diagnosis not present

## 2021-02-22 NOTE — Telephone Encounter (Signed)
Requested medications are due for refill today yes  Requested medications are on the active medication list yes  Last refill 9/24/ (30 day supply)  Last visit 12/19/20  Future visit scheduled 04/22/21  Notes to clinic prescriber not at this practice, please assess. Requested Prescriptions  Pending Prescriptions Disp Refills   clopidogrel (PLAVIX) 75 MG tablet [Pharmacy Med Name: Clopidogrel Bisulfate 75 MG Oral Tablet] 90 tablet 3    Sig: TAKE 1 TABLET BY MOUTH  DAILY     Hematology: Antiplatelets - clopidogrel Failed - 02/21/2021 10:41 PM      Failed - Evaluate AST, ALT within 2 months of therapy initiation.      Failed - HCT in normal range and within 180 days    HCT  Date Value Ref Range Status  11/26/2020 31.5 (L) 36.0 - 46.0 % Final   Hematocrit  Date Value Ref Range Status  04/10/2020 34.7 34.0 - 46.6 % Final          Failed - HGB in normal range and within 180 days    Hemoglobin  Date Value Ref Range Status  11/26/2020 10.0 (L) 12.0 - 15.0 g/dL Final  04/10/2020 11.2 11.1 - 15.9 g/dL Final          Passed - ALT in normal range and within 360 days    ALT  Date Value Ref Range Status  11/26/2020 16 0 - 44 U/L Final          Passed - AST in normal range and within 360 days    AST  Date Value Ref Range Status  11/26/2020 17 15 - 41 U/L Final          Passed - PLT in normal range and within 180 days    Platelets  Date Value Ref Range Status  11/26/2020 224 150 - 400 K/uL Final  04/10/2020 223 150 - 450 x10E3/uL Final          Passed - Valid encounter within last 6 months    Recent Outpatient Visits           2 months ago Need for influenza vaccination   [facility] Jerrol Banana., MD   5 months ago Cellulitis of finger, unspecified laterality   [facility] Jerrol Banana., MD   5 months ago [place] Rosanna Randy, Retia Passe., MD   7 months ago Skin ulcer of sacrum, limited to  breakdown of skin [facility])   [facility] Carles [place], [place]   10 months ago Encounter for immunization   [facility] Jerrol Banana., MD       Future Appointments             In 1 month Jerrol Banana., MD [facility]

## 2021-02-23 DIAGNOSIS — J449 Chronic obstructive pulmonary disease, unspecified: Secondary | ICD-10-CM | POA: Diagnosis not present

## 2021-02-23 DIAGNOSIS — R0602 Shortness of breath: Secondary | ICD-10-CM | POA: Diagnosis not present

## 2021-02-24 NOTE — Telephone Encounter (Signed)
Patient reports they did receive message. Reports test was negative.

## 2021-02-25 DIAGNOSIS — Z85118 Personal history of other malignant neoplasm of bronchus and lung: Secondary | ICD-10-CM | POA: Diagnosis not present

## 2021-02-25 DIAGNOSIS — Z7984 Long term (current) use of oral hypoglycemic drugs: Secondary | ICD-10-CM | POA: Diagnosis not present

## 2021-02-25 DIAGNOSIS — E1159 Type 2 diabetes mellitus with other circulatory complications: Secondary | ICD-10-CM | POA: Diagnosis not present

## 2021-02-25 DIAGNOSIS — Z7982 Long term (current) use of aspirin: Secondary | ICD-10-CM | POA: Diagnosis not present

## 2021-02-25 DIAGNOSIS — E785 Hyperlipidemia, unspecified: Secondary | ICD-10-CM | POA: Diagnosis not present

## 2021-02-25 DIAGNOSIS — Z87891 Personal history of nicotine dependence: Secondary | ICD-10-CM | POA: Diagnosis not present

## 2021-02-25 DIAGNOSIS — I10 Essential (primary) hypertension: Secondary | ICD-10-CM | POA: Diagnosis not present

## 2021-02-25 DIAGNOSIS — Z8673 Personal history of transient ischemic attack (TIA), and cerebral infarction without residual deficits: Secondary | ICD-10-CM | POA: Diagnosis not present

## 2021-02-25 DIAGNOSIS — H3530 Unspecified macular degeneration: Secondary | ICD-10-CM | POA: Diagnosis not present

## 2021-02-25 DIAGNOSIS — J449 Chronic obstructive pulmonary disease, unspecified: Secondary | ICD-10-CM | POA: Diagnosis not present

## 2021-02-25 DIAGNOSIS — I2510 Atherosclerotic heart disease of native coronary artery without angina pectoris: Secondary | ICD-10-CM | POA: Diagnosis not present

## 2021-02-25 DIAGNOSIS — Z9181 History of falling: Secondary | ICD-10-CM | POA: Diagnosis not present

## 2021-02-25 DIAGNOSIS — Z9981 Dependence on supplemental oxygen: Secondary | ICD-10-CM | POA: Diagnosis not present

## 2021-02-26 DIAGNOSIS — E785 Hyperlipidemia, unspecified: Secondary | ICD-10-CM | POA: Diagnosis not present

## 2021-02-26 DIAGNOSIS — E1159 Type 2 diabetes mellitus with other circulatory complications: Secondary | ICD-10-CM | POA: Diagnosis not present

## 2021-02-26 DIAGNOSIS — Z7984 Long term (current) use of oral hypoglycemic drugs: Secondary | ICD-10-CM | POA: Diagnosis not present

## 2021-02-26 DIAGNOSIS — I10 Essential (primary) hypertension: Secondary | ICD-10-CM | POA: Diagnosis not present

## 2021-02-26 DIAGNOSIS — Z7982 Long term (current) use of aspirin: Secondary | ICD-10-CM | POA: Diagnosis not present

## 2021-02-26 DIAGNOSIS — Z9181 History of falling: Secondary | ICD-10-CM | POA: Diagnosis not present

## 2021-02-26 DIAGNOSIS — Z9981 Dependence on supplemental oxygen: Secondary | ICD-10-CM | POA: Diagnosis not present

## 2021-02-26 DIAGNOSIS — H3530 Unspecified macular degeneration: Secondary | ICD-10-CM | POA: Diagnosis not present

## 2021-02-26 DIAGNOSIS — J449 Chronic obstructive pulmonary disease, unspecified: Secondary | ICD-10-CM | POA: Diagnosis not present

## 2021-02-26 DIAGNOSIS — Z85118 Personal history of other malignant neoplasm of bronchus and lung: Secondary | ICD-10-CM | POA: Diagnosis not present

## 2021-02-26 DIAGNOSIS — I2510 Atherosclerotic heart disease of native coronary artery without angina pectoris: Secondary | ICD-10-CM | POA: Diagnosis not present

## 2021-02-26 DIAGNOSIS — Z87891 Personal history of nicotine dependence: Secondary | ICD-10-CM | POA: Diagnosis not present

## 2021-02-26 DIAGNOSIS — Z8673 Personal history of transient ischemic attack (TIA), and cerebral infarction without residual deficits: Secondary | ICD-10-CM | POA: Diagnosis not present

## 2021-03-03 DIAGNOSIS — I10 Essential (primary) hypertension: Secondary | ICD-10-CM | POA: Diagnosis not present

## 2021-03-03 DIAGNOSIS — Z7984 Long term (current) use of oral hypoglycemic drugs: Secondary | ICD-10-CM | POA: Diagnosis not present

## 2021-03-03 DIAGNOSIS — E785 Hyperlipidemia, unspecified: Secondary | ICD-10-CM | POA: Diagnosis not present

## 2021-03-03 DIAGNOSIS — E1159 Type 2 diabetes mellitus with other circulatory complications: Secondary | ICD-10-CM | POA: Diagnosis not present

## 2021-03-03 DIAGNOSIS — H3530 Unspecified macular degeneration: Secondary | ICD-10-CM | POA: Diagnosis not present

## 2021-03-03 DIAGNOSIS — Z7982 Long term (current) use of aspirin: Secondary | ICD-10-CM | POA: Diagnosis not present

## 2021-03-03 DIAGNOSIS — I2510 Atherosclerotic heart disease of native coronary artery without angina pectoris: Secondary | ICD-10-CM | POA: Diagnosis not present

## 2021-03-03 DIAGNOSIS — Z9181 History of falling: Secondary | ICD-10-CM | POA: Diagnosis not present

## 2021-03-03 DIAGNOSIS — Z85118 Personal history of other malignant neoplasm of bronchus and lung: Secondary | ICD-10-CM | POA: Diagnosis not present

## 2021-03-03 DIAGNOSIS — J449 Chronic obstructive pulmonary disease, unspecified: Secondary | ICD-10-CM | POA: Diagnosis not present

## 2021-03-03 DIAGNOSIS — Z8673 Personal history of transient ischemic attack (TIA), and cerebral infarction without residual deficits: Secondary | ICD-10-CM | POA: Diagnosis not present

## 2021-03-03 DIAGNOSIS — Z9981 Dependence on supplemental oxygen: Secondary | ICD-10-CM | POA: Diagnosis not present

## 2021-03-03 DIAGNOSIS — Z87891 Personal history of nicotine dependence: Secondary | ICD-10-CM | POA: Diagnosis not present

## 2021-03-04 DIAGNOSIS — H3530 Unspecified macular degeneration: Secondary | ICD-10-CM | POA: Diagnosis not present

## 2021-03-04 DIAGNOSIS — Z87891 Personal history of nicotine dependence: Secondary | ICD-10-CM | POA: Diagnosis not present

## 2021-03-04 DIAGNOSIS — Z7984 Long term (current) use of oral hypoglycemic drugs: Secondary | ICD-10-CM | POA: Diagnosis not present

## 2021-03-04 DIAGNOSIS — Z9181 History of falling: Secondary | ICD-10-CM | POA: Diagnosis not present

## 2021-03-04 DIAGNOSIS — Z8673 Personal history of transient ischemic attack (TIA), and cerebral infarction without residual deficits: Secondary | ICD-10-CM | POA: Diagnosis not present

## 2021-03-04 DIAGNOSIS — I2510 Atherosclerotic heart disease of native coronary artery without angina pectoris: Secondary | ICD-10-CM | POA: Diagnosis not present

## 2021-03-04 DIAGNOSIS — Z85118 Personal history of other malignant neoplasm of bronchus and lung: Secondary | ICD-10-CM | POA: Diagnosis not present

## 2021-03-04 DIAGNOSIS — I10 Essential (primary) hypertension: Secondary | ICD-10-CM | POA: Diagnosis not present

## 2021-03-04 DIAGNOSIS — Z7982 Long term (current) use of aspirin: Secondary | ICD-10-CM | POA: Diagnosis not present

## 2021-03-04 DIAGNOSIS — E1159 Type 2 diabetes mellitus with other circulatory complications: Secondary | ICD-10-CM | POA: Diagnosis not present

## 2021-03-04 DIAGNOSIS — E785 Hyperlipidemia, unspecified: Secondary | ICD-10-CM | POA: Diagnosis not present

## 2021-03-04 DIAGNOSIS — J449 Chronic obstructive pulmonary disease, unspecified: Secondary | ICD-10-CM | POA: Diagnosis not present

## 2021-03-04 DIAGNOSIS — Z9981 Dependence on supplemental oxygen: Secondary | ICD-10-CM | POA: Diagnosis not present

## 2021-03-04 LAB — HM DIABETES FOOT EXAM: HM Diabetic Foot Exam: ABNORMAL

## 2021-03-06 ENCOUNTER — Other Ambulatory Visit: Payer: Self-pay | Admitting: Family Medicine

## 2021-03-06 DIAGNOSIS — J449 Chronic obstructive pulmonary disease, unspecified: Secondary | ICD-10-CM | POA: Diagnosis not present

## 2021-03-06 DIAGNOSIS — Z7982 Long term (current) use of aspirin: Secondary | ICD-10-CM | POA: Diagnosis not present

## 2021-03-06 DIAGNOSIS — Z9981 Dependence on supplemental oxygen: Secondary | ICD-10-CM | POA: Diagnosis not present

## 2021-03-06 DIAGNOSIS — I2510 Atherosclerotic heart disease of native coronary artery without angina pectoris: Secondary | ICD-10-CM | POA: Diagnosis not present

## 2021-03-06 DIAGNOSIS — Z9181 History of falling: Secondary | ICD-10-CM | POA: Diagnosis not present

## 2021-03-06 DIAGNOSIS — Z8673 Personal history of transient ischemic attack (TIA), and cerebral infarction without residual deficits: Secondary | ICD-10-CM | POA: Diagnosis not present

## 2021-03-06 DIAGNOSIS — E1142 Type 2 diabetes mellitus with diabetic polyneuropathy: Secondary | ICD-10-CM

## 2021-03-06 DIAGNOSIS — Z85118 Personal history of other malignant neoplasm of bronchus and lung: Secondary | ICD-10-CM | POA: Diagnosis not present

## 2021-03-06 DIAGNOSIS — I10 Essential (primary) hypertension: Secondary | ICD-10-CM | POA: Diagnosis not present

## 2021-03-06 DIAGNOSIS — E785 Hyperlipidemia, unspecified: Secondary | ICD-10-CM | POA: Diagnosis not present

## 2021-03-06 DIAGNOSIS — Z87891 Personal history of nicotine dependence: Secondary | ICD-10-CM | POA: Diagnosis not present

## 2021-03-06 DIAGNOSIS — Z7984 Long term (current) use of oral hypoglycemic drugs: Secondary | ICD-10-CM | POA: Diagnosis not present

## 2021-03-06 DIAGNOSIS — H3530 Unspecified macular degeneration: Secondary | ICD-10-CM | POA: Diagnosis not present

## 2021-03-06 DIAGNOSIS — E1159 Type 2 diabetes mellitus with other circulatory complications: Secondary | ICD-10-CM | POA: Diagnosis not present

## 2021-03-07 NOTE — Telephone Encounter (Signed)
Requested medication (s) are due for refill today: NO  Requested medication (s) are on the active medication list: NO  Last refill:  01/09/21  Future visit scheduled: 04/22/21  Notes to clinic:  not on current med list, please assess.      Requested Prescriptions  Pending Prescriptions Disp Refills   ACCU-CHEK GUIDE test strip [Pharmacy Med Name: Accu-Chek Guide In Vitro Strip] 100 strip 3    Sig: [place]     Endocrinology: Diabetes - Testing Supplies Passed - 03/06/2021  5:11 AM      Passed - Valid encounter within last 12 months    Recent Outpatient Visits           2 months ago Need for influenza vaccination   [facility] Jerrol Banana., MD   5 months ago Cellulitis of finger, unspecified laterality   [facility] Jerrol Banana., MD   6 months ago [place] Rosanna Randy, Retia Passe., MD   7 months ago Skin ulcer of sacrum, limited to breakdown of skin [facility])   [facility] Carles Col[place], [place]   11 months ago Encounter for immunization   [facility] Jerrol Banana., MD       Future Appointments             In 1 month Jerrol Banana., MD [facility], [place]

## 2021-03-08 DIAGNOSIS — J9611 Chronic respiratory failure with hypoxia: Secondary | ICD-10-CM | POA: Diagnosis not present

## 2021-03-08 DIAGNOSIS — Z9181 History of falling: Secondary | ICD-10-CM | POA: Diagnosis not present

## 2021-03-08 DIAGNOSIS — J432 Centrilobular emphysema: Secondary | ICD-10-CM | POA: Diagnosis not present

## 2021-03-11 ENCOUNTER — Other Ambulatory Visit: Payer: Medicare Other | Admitting: Student

## 2021-03-11 ENCOUNTER — Other Ambulatory Visit: Payer: Self-pay

## 2021-03-11 DIAGNOSIS — Z87891 Personal history of nicotine dependence: Secondary | ICD-10-CM | POA: Diagnosis not present

## 2021-03-11 DIAGNOSIS — H3530 Unspecified macular degeneration: Secondary | ICD-10-CM | POA: Diagnosis not present

## 2021-03-11 DIAGNOSIS — R531 Weakness: Secondary | ICD-10-CM | POA: Diagnosis not present

## 2021-03-11 DIAGNOSIS — Z515 Encounter for palliative care: Secondary | ICD-10-CM

## 2021-03-11 DIAGNOSIS — I2510 Atherosclerotic heart disease of native coronary artery without angina pectoris: Secondary | ICD-10-CM | POA: Diagnosis not present

## 2021-03-11 DIAGNOSIS — Z8673 Personal history of transient ischemic attack (TIA), and cerebral infarction without residual deficits: Secondary | ICD-10-CM | POA: Diagnosis not present

## 2021-03-11 DIAGNOSIS — Z85118 Personal history of other malignant neoplasm of bronchus and lung: Secondary | ICD-10-CM | POA: Diagnosis not present

## 2021-03-11 DIAGNOSIS — R0609 Other forms of dyspnea: Secondary | ICD-10-CM

## 2021-03-11 DIAGNOSIS — Z7984 Long term (current) use of oral hypoglycemic drugs: Secondary | ICD-10-CM | POA: Diagnosis not present

## 2021-03-11 DIAGNOSIS — Z7982 Long term (current) use of aspirin: Secondary | ICD-10-CM | POA: Diagnosis not present

## 2021-03-11 DIAGNOSIS — Z9181 History of falling: Secondary | ICD-10-CM | POA: Diagnosis not present

## 2021-03-11 DIAGNOSIS — J449 Chronic obstructive pulmonary disease, unspecified: Secondary | ICD-10-CM | POA: Diagnosis not present

## 2021-03-11 DIAGNOSIS — E1159 Type 2 diabetes mellitus with other circulatory complications: Secondary | ICD-10-CM | POA: Diagnosis not present

## 2021-03-11 DIAGNOSIS — I10 Essential (primary) hypertension: Secondary | ICD-10-CM | POA: Diagnosis not present

## 2021-03-11 DIAGNOSIS — Z9981 Dependence on supplemental oxygen: Secondary | ICD-10-CM | POA: Diagnosis not present

## 2021-03-11 DIAGNOSIS — E785 Hyperlipidemia, unspecified: Secondary | ICD-10-CM | POA: Diagnosis not present

## 2021-03-11 NOTE — Progress Notes (Signed)
[facility] Pall[facility] Care Consult Note Telephone: [phone]  Fax: [phone]    Date of encounter: 03/11/21 2:23 PM PATIENT NAME: [name] [name] [address] [place] [address]   [phone] (home)  DOB: [birth year] MRN: [id number] PRIMARY CARE PROVIDER:    Jerrol Banana., MD,  [address] [place] [address] [phone]  REFERRING PROVIDER:   Jerrol Banana., MD [address] [place],  [place] [address] [phone]  RESPONSIBLE PARTY:    Contact Information     Name Relation Home Work Mobile   Dubeau,[name] Spouse [phone]  [phone]   Roosvelt Harps Daughter [phone]  [phone]   Rolland Bimler [phone]     charles,carrie Relative   [phone]        I met face to face with patient and family in the home. Palliative Care was asked to follow this patient by consultation request of  Jerrol Banana.,* to address advance care planning and complex medical decision making. This is a follow up visit.                                   ASSESSMENT AND PLAN / RECOMMENDATIONS:   Advance Care Planning/Goals of Care: Goals include to maximize quality of life and symptom management. Our advance care planning conversation included a discussion about:    The value and importance of advance care planning  Experiences with loved ones who have been seriously ill or have died  Exploration of personal, cultural or spiritual beliefs that might influence medical decisions  Exploration of goals of care in the event of a sudden injury or illness  CODE STATUS: DNR  Symptom Management/Plan:  Dyspnea- secondary to COPD. No worsening symptoms. Continue oxygen at 3 lpm via nasal canula. Continue ipratropium bromide every 4 hours PRN. Mucinex BID. Continue using flutter valve.   Generalized weakness-secondary to COPD. Continue PT/OT as directed. She is encouraged to  continue exercises to maintain her current level. Use walker, w/c for safety.     Follow up Palliative Care Visit: Palliative care will continue to follow for complex medical decision making, advance care planning, and clarification of goals. Return in 8 weeks or prn.  I spent 25 minutes providing this consultation. More than 50% of the time in this consultation was spent in counseling and care coordination.   PPS: 40%  HOSPICE ELIGIBILITY/DIAGNOSIS: TBD  Chief Complaint: Palliative Medicine follow up visit.   HISTORY OF PRESENT ILLNESS:  [name] [name] is a 80 y.o. year old female  with COPD, CKD 3, right upper lobe cancer, T2DM with polyneuropathy. Patient last saw Dr. [name]- 2021; she received chemotherapy and radiation.   Patient states she is doing well. Patient denies having pain, nausea, constipation. She states her shortness of breath has been stable. No worsening of cough. No recent infections. Wearing oxygen at 3 lpm. Patient is currently receiving PT/OT. Fair appetite reported; she snacks throughout the day. Blood sugar checked each morning; today 135 mg /dL. Husband asks for patient heel to be assessed, he was told she had a "hole in her heel." A 10-point review of systems is negative, except for the pertinent positives and negatives detailed in the HPI.    History obtained from review of EMR, discussion with primary team, and interview with family, facility staff/caregiver and/or [name] [name].  I reviewed available labs, medications, imaging,  studies and related documents from the EMR.  Records reviewed and summarized above.    Physical Exam: Pulse 64, resp 16, b/p128/60 sats 99% at 3 LPM Constitutional: NAD General: frail appearing EYES: anicteric sclera, lids intact, no discharge  ENMT: intact hearing, oral mucous membranes moist CV: S1S2, RRR, no LE edema Pulmonary: LCTA,  bases diminished, no increased work of breathing, occasional non productive cough Abdomen:  normo-active BS + 4 quadrants, soft and non tender, no ascites GU: deferred MSK: moves all extremities, ambulatory Skin: warm and dry, no rashes, pinpoint scab to right heel, no erythema Neuro: generalized weakness, A & O x 3, forgetful Psych: non-anxious affect, pleasant Hem/lymph/immuno: no widespread bruising   Thank you for the opportunity to participate in the care of [name] [name].  The palliative care team will continue to follow. Please call our office at [phone] if we can be of additional assistance.   Ezekiel Slocumb, NP   COVID-19 PATIENT SCREENING TOOL Asked and negative response unless otherwise noted:   Have you had symptoms of covid, tested positive or been in contact with someone with symptoms/positive test in the past 5-10 days?  No

## 2021-03-12 ENCOUNTER — Other Ambulatory Visit: Payer: Self-pay | Admitting: Adult Health

## 2021-03-12 ENCOUNTER — Other Ambulatory Visit: Payer: Self-pay | Admitting: Family Medicine

## 2021-03-12 DIAGNOSIS — F0150 Vascular dementia without behavioral disturbance: Secondary | ICD-10-CM

## 2021-03-12 DIAGNOSIS — I10 Essential (primary) hypertension: Secondary | ICD-10-CM

## 2021-03-12 NOTE — Telephone Encounter (Signed)
Requested Prescriptions  Pending Prescriptions Disp Refills  . donepezil (ARICEPT) 5 MG tablet [Pharmacy Med Name: DONEPEZIL HCL 5 MG TABLET] 90 tablet 0    Sig: TAKE 1 TABLET BY MOUTH EVERYDAY AT BEDTIME     Neurology:  Alzheimer's Agents Passed - 03/12/2021  5:33 PM      Passed - Valid encounter within last 6 months    Recent Outpatient Visits          2 months ago Need for influenza vaccination   [facility] Jerrol Banana., MD   5 months ago Cellulitis of finger, unspecified laterality   [facility] Jerrol Banana., MD   6 months ago [place] Rosanna Randy, Retia Passe., MD   8 months ago Skin ulcer of sacrum, limited to breakdown of skin [facility])   [facility] Carles Collet [place], [place]   11 months ago Encounter for immunization   [facility] Jerrol Banana., MD      Future Appointments            In 1 month Jerrol Banana., MD [facility], [place]

## 2021-03-13 DIAGNOSIS — Z8673 Personal history of transient ischemic attack (TIA), and cerebral infarction without residual deficits: Secondary | ICD-10-CM | POA: Diagnosis not present

## 2021-03-13 DIAGNOSIS — E1159 Type 2 diabetes mellitus with other circulatory complications: Secondary | ICD-10-CM | POA: Diagnosis not present

## 2021-03-13 DIAGNOSIS — Z7984 Long term (current) use of oral hypoglycemic drugs: Secondary | ICD-10-CM | POA: Diagnosis not present

## 2021-03-13 DIAGNOSIS — Z87891 Personal history of nicotine dependence: Secondary | ICD-10-CM | POA: Diagnosis not present

## 2021-03-13 DIAGNOSIS — E785 Hyperlipidemia, unspecified: Secondary | ICD-10-CM | POA: Diagnosis not present

## 2021-03-13 DIAGNOSIS — I10 Essential (primary) hypertension: Secondary | ICD-10-CM | POA: Diagnosis not present

## 2021-03-13 DIAGNOSIS — Z85118 Personal history of other malignant neoplasm of bronchus and lung: Secondary | ICD-10-CM | POA: Diagnosis not present

## 2021-03-13 DIAGNOSIS — Z9181 History of falling: Secondary | ICD-10-CM | POA: Diagnosis not present

## 2021-03-13 DIAGNOSIS — Z7982 Long term (current) use of aspirin: Secondary | ICD-10-CM | POA: Diagnosis not present

## 2021-03-13 DIAGNOSIS — I2510 Atherosclerotic heart disease of native coronary artery without angina pectoris: Secondary | ICD-10-CM | POA: Diagnosis not present

## 2021-03-13 DIAGNOSIS — Z9981 Dependence on supplemental oxygen: Secondary | ICD-10-CM | POA: Diagnosis not present

## 2021-03-13 DIAGNOSIS — J449 Chronic obstructive pulmonary disease, unspecified: Secondary | ICD-10-CM | POA: Diagnosis not present

## 2021-03-13 DIAGNOSIS — H3530 Unspecified macular degeneration: Secondary | ICD-10-CM | POA: Diagnosis not present

## 2021-03-18 ENCOUNTER — Telehealth: Payer: Self-pay | Admitting: Family Medicine

## 2021-03-18 DIAGNOSIS — E1159 Type 2 diabetes mellitus with other circulatory complications: Secondary | ICD-10-CM | POA: Diagnosis not present

## 2021-03-18 DIAGNOSIS — J449 Chronic obstructive pulmonary disease, unspecified: Secondary | ICD-10-CM | POA: Diagnosis not present

## 2021-03-18 DIAGNOSIS — I10 Essential (primary) hypertension: Secondary | ICD-10-CM | POA: Diagnosis not present

## 2021-03-18 DIAGNOSIS — H3530 Unspecified macular degeneration: Secondary | ICD-10-CM | POA: Diagnosis not present

## 2021-03-18 DIAGNOSIS — Z9181 History of falling: Secondary | ICD-10-CM | POA: Diagnosis not present

## 2021-03-18 DIAGNOSIS — Z7982 Long term (current) use of aspirin: Secondary | ICD-10-CM | POA: Diagnosis not present

## 2021-03-18 DIAGNOSIS — I2510 Atherosclerotic heart disease of native coronary artery without angina pectoris: Secondary | ICD-10-CM | POA: Diagnosis not present

## 2021-03-18 DIAGNOSIS — Z9981 Dependence on supplemental oxygen: Secondary | ICD-10-CM | POA: Diagnosis not present

## 2021-03-18 DIAGNOSIS — Z7984 Long term (current) use of oral hypoglycemic drugs: Secondary | ICD-10-CM | POA: Diagnosis not present

## 2021-03-18 DIAGNOSIS — Z8673 Personal history of transient ischemic attack (TIA), and cerebral infarction without residual deficits: Secondary | ICD-10-CM | POA: Diagnosis not present

## 2021-03-18 DIAGNOSIS — Z87891 Personal history of nicotine dependence: Secondary | ICD-10-CM | POA: Diagnosis not present

## 2021-03-18 DIAGNOSIS — Z85118 Personal history of other malignant neoplasm of bronchus and lung: Secondary | ICD-10-CM | POA: Diagnosis not present

## 2021-03-18 DIAGNOSIS — E785 Hyperlipidemia, unspecified: Secondary | ICD-10-CM | POA: Diagnosis not present

## 2021-03-18 NOTE — Telephone Encounter (Signed)
FYI

## 2021-03-18 NOTE — Telephone Encounter (Signed)
[name] [name] with [facility] is calling in to report 2 falls. Pt fell twice on Sunday. Pt says that she is sore and has some bruising. Pt doesn't have any swelling or fractures.    CB if needed. [phone]

## 2021-03-20 ENCOUNTER — Other Ambulatory Visit: Payer: Self-pay | Admitting: Family Medicine

## 2021-03-20 DIAGNOSIS — I10 Essential (primary) hypertension: Secondary | ICD-10-CM | POA: Diagnosis not present

## 2021-03-20 DIAGNOSIS — E1159 Type 2 diabetes mellitus with other circulatory complications: Secondary | ICD-10-CM | POA: Diagnosis not present

## 2021-03-20 DIAGNOSIS — Z7984 Long term (current) use of oral hypoglycemic drugs: Secondary | ICD-10-CM | POA: Diagnosis not present

## 2021-03-20 DIAGNOSIS — Z9981 Dependence on supplemental oxygen: Secondary | ICD-10-CM | POA: Diagnosis not present

## 2021-03-20 DIAGNOSIS — E1342 Other specified diabetes mellitus with diabetic polyneuropathy: Secondary | ICD-10-CM

## 2021-03-20 DIAGNOSIS — I2510 Atherosclerotic heart disease of native coronary artery without angina pectoris: Secondary | ICD-10-CM

## 2021-03-20 DIAGNOSIS — M79604 Pain in right leg: Secondary | ICD-10-CM

## 2021-03-20 DIAGNOSIS — Z8673 Personal history of transient ischemic attack (TIA), and cerebral infarction without residual deficits: Secondary | ICD-10-CM | POA: Diagnosis not present

## 2021-03-20 DIAGNOSIS — E785 Hyperlipidemia, unspecified: Secondary | ICD-10-CM | POA: Diagnosis not present

## 2021-03-20 DIAGNOSIS — Z87891 Personal history of nicotine dependence: Secondary | ICD-10-CM | POA: Diagnosis not present

## 2021-03-20 DIAGNOSIS — J449 Chronic obstructive pulmonary disease, unspecified: Secondary | ICD-10-CM | POA: Diagnosis not present

## 2021-03-20 DIAGNOSIS — Z9181 History of falling: Secondary | ICD-10-CM | POA: Diagnosis not present

## 2021-03-20 DIAGNOSIS — Z7982 Long term (current) use of aspirin: Secondary | ICD-10-CM | POA: Diagnosis not present

## 2021-03-20 DIAGNOSIS — Z85118 Personal history of other malignant neoplasm of bronchus and lung: Secondary | ICD-10-CM | POA: Diagnosis not present

## 2021-03-20 DIAGNOSIS — M79605 Pain in left leg: Secondary | ICD-10-CM

## 2021-03-20 DIAGNOSIS — H3530 Unspecified macular degeneration: Secondary | ICD-10-CM | POA: Diagnosis not present

## 2021-03-21 DIAGNOSIS — Z8673 Personal history of transient ischemic attack (TIA), and cerebral infarction without residual deficits: Secondary | ICD-10-CM | POA: Diagnosis not present

## 2021-03-21 DIAGNOSIS — I10 Essential (primary) hypertension: Secondary | ICD-10-CM | POA: Diagnosis not present

## 2021-03-21 DIAGNOSIS — Z7982 Long term (current) use of aspirin: Secondary | ICD-10-CM | POA: Diagnosis not present

## 2021-03-21 DIAGNOSIS — Z85118 Personal history of other malignant neoplasm of bronchus and lung: Secondary | ICD-10-CM | POA: Diagnosis not present

## 2021-03-21 DIAGNOSIS — E785 Hyperlipidemia, unspecified: Secondary | ICD-10-CM | POA: Diagnosis not present

## 2021-03-21 DIAGNOSIS — Z7984 Long term (current) use of oral hypoglycemic drugs: Secondary | ICD-10-CM | POA: Diagnosis not present

## 2021-03-21 DIAGNOSIS — E1159 Type 2 diabetes mellitus with other circulatory complications: Secondary | ICD-10-CM | POA: Diagnosis not present

## 2021-03-21 DIAGNOSIS — Z9181 History of falling: Secondary | ICD-10-CM | POA: Diagnosis not present

## 2021-03-21 DIAGNOSIS — H3530 Unspecified macular degeneration: Secondary | ICD-10-CM | POA: Diagnosis not present

## 2021-03-21 DIAGNOSIS — Z9981 Dependence on supplemental oxygen: Secondary | ICD-10-CM | POA: Diagnosis not present

## 2021-03-21 DIAGNOSIS — J449 Chronic obstructive pulmonary disease, unspecified: Secondary | ICD-10-CM | POA: Diagnosis not present

## 2021-03-21 DIAGNOSIS — I2510 Atherosclerotic heart disease of native coronary artery without angina pectoris: Secondary | ICD-10-CM | POA: Diagnosis not present

## 2021-03-21 DIAGNOSIS — Z87891 Personal history of nicotine dependence: Secondary | ICD-10-CM | POA: Diagnosis not present

## 2021-03-21 MED ORDER — ATORVASTATIN CALCIUM 40 MG PO TABS: 40.0000 mg | ORAL_TABLET | Freq: Every day | ORAL | 3 refills | Status: AC

## 2021-03-21 NOTE — Telephone Encounter (Signed)
Requested medication (s) are due for refill today: Lipitor due, all others not ordered after discharge.  Requested medication (s) are on the active medication list: Just Lipitor  Last refill:  01/30/21 for 30 days  Future visit scheduled: 04/22/21, labs are from 06/07/2019  Notes to clinic:  Lipitor from provider not in this practice, other meds not continued at discharge. Please assess.   Requested Prescriptions  Pending Prescriptions Disp Refills   atorvastatin (LIPITOR) 40 MG tablet [Pharmacy Med Name: Atorvastatin Calcium 40 MG Oral Tablet] 90 tablet 3    Sig: TAKE 1 TABLET BY MOUTH  DAILY AT 6 PM     Cardiovascular:  Antilipid - Statins Failed - 03/20/2021  1:54 PM      Failed - Total Cholesterol in normal range and within 360 days    Cholesterol, Total  Date Value Ref Range Status  05/24/2017 210 (H) 100 - 199 mg/dL Final   Cholesterol  Date Value Ref Range Status  06/07/2019 149 0 - 200 mg/dL Final          Failed - LDL in normal range and within 360 days    LDL Calculated  Date Value Ref Range Status  05/24/2017 88 0 - 99 mg/dL Final   LDL Cholesterol  Date Value Ref Range Status  06/07/2019 84 0 - 99 mg/dL Final    Comment:           Total Cholesterol/HDL:CHD Risk Coronary Heart Disease Risk Table                     Men   Women  1/2 Average Risk   3.4   3.3  Average Risk       5.0   4.4  2 X Average Risk   9.6   7.1  3 X Average Risk  23.4   11.0        Use the calculated Patient Ratio above and the CHD Risk Table to determine the patient's CHD Risk.        ATP III CLASSIFICATION (LDL):  <100     mg/dL   Optimal  100-129  mg/dL   Near or Above                    Optimal  130-159  mg/dL   Borderline  160-189  mg/dL   High  >190     mg/dL   Very High Performed at [facility] [address], [place], [place] [address]           Failed - HDL in normal range and within 360 days    HDL  Date Value Ref Range Status  06/07/2019 57 >40 mg/dL  Final  05/24/2017 52 >39 mg/dL Final          Failed - Triglycerides in normal range and within 360 days    Triglycerides  Date Value Ref Range Status  06/07/2019 41 <150 mg/dL Final          Passed - Patient is not pregnant      Passed - Valid encounter within last 12 months    Recent Outpatient Visits           3 months ago Need for influenza vaccination   [facility] Jerrol Banana., [facility]   6 months ago Cellulitis of finger, unspecified laterality   [facility] Jerrol Banana., [facility]   6 months ago [id number]  [facility] Jerrol Banana., MD   8 months ago Skin ulcer of sacrum, limited to breakdown of skin [facility])   [facility] Carles Collet [place], [place]   11 months ago Encounter for immunization   [facility] Jerrol Banana., MD       Future Appointments             In 1 month Jerrol Banana., MD [facility]             glimepiride (AMARYL) 2 MG tablet [Pharmacy Med Name: Glimepiride 2 MG Oral Tablet] 180 tablet 3    Sig: TAKE 1 TABLET BY MOUTH  TWICE DAILY     Endocrinology:  Diabetes - Sulfonylureas Passed - 03/20/2021  1:54 PM      Passed - HBA1C is between 0 and 7.9 and within 180 days    Hgb A1c MFr Bld  Date Value Ref Range Status  11/24/2020 6.6 (H) 4.8 - 5.6 % Final    Comment:    (NOTE) Pre diabetes:          5.7%-6.4%  Diabetes:              >6.4%  Glycemic control for   <7.0% adults with diabetes           Passed - Valid encounter within last 6 months    Recent Outpatient Visits           3 months ago Need for influenza vaccination   [facility] Jerrol Banana., MD   6 months ago Cellulitis of finger, unspecified laterality   [facility] Jerrol Banana., MD   6 months ago [place] Rosanna Randy, Retia Passe., MD   8 months ago Skin ulcer of  sacrum, limited to breakdown of skin [facility])   [facility] Carles Collet [place], [place]   11 months ago Encounter for immunization   [place], Retia Passe., MD       Future Appointments             In 1 month Jerrol Banana., MD [facility]             isosorbide mononitrate (IMDUR) 60 MG 24 hr tablet [Pharmacy Med Name: Isosorbide Mononitrate ER 60 MG Oral Tablet Extended Release 24 Hour] 90 tablet 3    Sig: TAKE 1 TABLET BY MOUTH  DAILY     Cardiovascular:  Nitrates Passed - 03/20/2021  1:54 PM      Passed - Last BP in normal range    BP Readings from Last 1 Encounters:  12/19/20 119/62          Passed - Last Heart Rate in normal range    Pulse Readings from Last 1 Encounters:  12/19/20 (!) 15          Passed - Valid encounter within last 12 months    Recent Outpatient Visits           3 months ago Need for influenza vaccination   [facility] Jerrol Banana., MD   6 months ago Cellulitis of finger, unspecified laterality   [facility] Jerrol Banana., MD   6 months ago [place] Rosanna Randy, Retia Passe., MD   8 months ago Skin ulcer of sacrum, limited to breakdown of  skin [facility])   [facility] Carles Collet [place], [place]   11 months ago Encounter for immunization   [facility] Ros[facility] Randy, Retia Passe., MD       Future Appointments             In 1 month Jerrol Banana., MD [facility]             gabapentin (NEURONTIN) 600 MG tablet [Pharmacy Med Name: Gabapentin 600 MG Oral Tablet] 90 tablet 3    Sig: [place]     Neurology: Anticonvulsants - gabapentin Passed - 03/20/2021  1:54 PM      Passed - Valid encounter within last 12 months    Recent Outpatient Visits           3 months ago Need for influenza vaccination   [facility] Jerrol Banana., MD   6 months ago Cellulitis of finger, unspecified laterality   [facility] Jerrol Banana., MD   6 months ago [place] Rosanna Randy, Retia Passe., MD   8 months ago Skin ulcer of sacrum, limited to breakdown of skin [facility])   [facility] Carles Collet [place], [place]   11 months ago Encounter for immunization   [facility] Jerrol Banana., MD       Future Appointments             In 1 month Jerrol Banana., MD [facility], [place]

## 2021-03-25 DIAGNOSIS — J449 Chronic obstructive pulmonary disease, unspecified: Secondary | ICD-10-CM | POA: Diagnosis not present

## 2021-03-25 DIAGNOSIS — R0602 Shortness of breath: Secondary | ICD-10-CM | POA: Diagnosis not present

## 2021-03-26 DIAGNOSIS — I2510 Atherosclerotic heart disease of native coronary artery without angina pectoris: Secondary | ICD-10-CM | POA: Diagnosis not present

## 2021-03-26 DIAGNOSIS — Z87891 Personal history of nicotine dependence: Secondary | ICD-10-CM | POA: Diagnosis not present

## 2021-03-26 DIAGNOSIS — I10 Essential (primary) hypertension: Secondary | ICD-10-CM | POA: Diagnosis not present

## 2021-03-26 DIAGNOSIS — Z7984 Long term (current) use of oral hypoglycemic drugs: Secondary | ICD-10-CM | POA: Diagnosis not present

## 2021-03-26 DIAGNOSIS — Z8673 Personal history of transient ischemic attack (TIA), and cerebral infarction without residual deficits: Secondary | ICD-10-CM | POA: Diagnosis not present

## 2021-03-26 DIAGNOSIS — Z9181 History of falling: Secondary | ICD-10-CM | POA: Diagnosis not present

## 2021-03-26 DIAGNOSIS — Z85118 Personal history of other malignant neoplasm of bronchus and lung: Secondary | ICD-10-CM | POA: Diagnosis not present

## 2021-03-26 DIAGNOSIS — J449 Chronic obstructive pulmonary disease, unspecified: Secondary | ICD-10-CM | POA: Diagnosis not present

## 2021-03-26 DIAGNOSIS — Z9981 Dependence on supplemental oxygen: Secondary | ICD-10-CM | POA: Diagnosis not present

## 2021-03-26 DIAGNOSIS — E785 Hyperlipidemia, unspecified: Secondary | ICD-10-CM | POA: Diagnosis not present

## 2021-03-26 DIAGNOSIS — E1159 Type 2 diabetes mellitus with other circulatory complications: Secondary | ICD-10-CM | POA: Diagnosis not present

## 2021-03-26 DIAGNOSIS — H3530 Unspecified macular degeneration: Secondary | ICD-10-CM | POA: Diagnosis not present

## 2021-03-26 DIAGNOSIS — Z7982 Long term (current) use of aspirin: Secondary | ICD-10-CM | POA: Diagnosis not present

## 2021-03-27 DIAGNOSIS — Z87891 Personal history of nicotine dependence: Secondary | ICD-10-CM | POA: Diagnosis not present

## 2021-03-27 DIAGNOSIS — J449 Chronic obstructive pulmonary disease, unspecified: Secondary | ICD-10-CM | POA: Diagnosis not present

## 2021-03-27 DIAGNOSIS — H3530 Unspecified macular degeneration: Secondary | ICD-10-CM | POA: Diagnosis not present

## 2021-03-27 DIAGNOSIS — Z85118 Personal history of other malignant neoplasm of bronchus and lung: Secondary | ICD-10-CM | POA: Diagnosis not present

## 2021-03-27 DIAGNOSIS — I2510 Atherosclerotic heart disease of native coronary artery without angina pectoris: Secondary | ICD-10-CM | POA: Diagnosis not present

## 2021-03-27 DIAGNOSIS — E785 Hyperlipidemia, unspecified: Secondary | ICD-10-CM | POA: Diagnosis not present

## 2021-03-27 DIAGNOSIS — I10 Essential (primary) hypertension: Secondary | ICD-10-CM | POA: Diagnosis not present

## 2021-03-27 DIAGNOSIS — Z9981 Dependence on supplemental oxygen: Secondary | ICD-10-CM | POA: Diagnosis not present

## 2021-03-27 DIAGNOSIS — Z7984 Long term (current) use of oral hypoglycemic drugs: Secondary | ICD-10-CM | POA: Diagnosis not present

## 2021-03-27 DIAGNOSIS — Z8673 Personal history of transient ischemic attack (TIA), and cerebral infarction without residual deficits: Secondary | ICD-10-CM | POA: Diagnosis not present

## 2021-03-27 DIAGNOSIS — E1159 Type 2 diabetes mellitus with other circulatory complications: Secondary | ICD-10-CM | POA: Diagnosis not present

## 2021-03-27 DIAGNOSIS — Z7982 Long term (current) use of aspirin: Secondary | ICD-10-CM | POA: Diagnosis not present

## 2021-03-27 DIAGNOSIS — Z9181 History of falling: Secondary | ICD-10-CM | POA: Diagnosis not present

## 2021-03-28 DIAGNOSIS — Z7982 Long term (current) use of aspirin: Secondary | ICD-10-CM | POA: Diagnosis not present

## 2021-03-28 DIAGNOSIS — Z85118 Personal history of other malignant neoplasm of bronchus and lung: Secondary | ICD-10-CM | POA: Diagnosis not present

## 2021-03-28 DIAGNOSIS — Z9981 Dependence on supplemental oxygen: Secondary | ICD-10-CM | POA: Diagnosis not present

## 2021-03-28 DIAGNOSIS — H3530 Unspecified macular degeneration: Secondary | ICD-10-CM | POA: Diagnosis not present

## 2021-03-28 DIAGNOSIS — I10 Essential (primary) hypertension: Secondary | ICD-10-CM | POA: Diagnosis not present

## 2021-03-28 DIAGNOSIS — J449 Chronic obstructive pulmonary disease, unspecified: Secondary | ICD-10-CM | POA: Diagnosis not present

## 2021-03-28 DIAGNOSIS — I2510 Atherosclerotic heart disease of native coronary artery without angina pectoris: Secondary | ICD-10-CM | POA: Diagnosis not present

## 2021-03-28 DIAGNOSIS — Z87891 Personal history of nicotine dependence: Secondary | ICD-10-CM | POA: Diagnosis not present

## 2021-03-28 DIAGNOSIS — Z7984 Long term (current) use of oral hypoglycemic drugs: Secondary | ICD-10-CM | POA: Diagnosis not present

## 2021-03-28 DIAGNOSIS — Z9181 History of falling: Secondary | ICD-10-CM | POA: Diagnosis not present

## 2021-03-28 DIAGNOSIS — Z8673 Personal history of transient ischemic attack (TIA), and cerebral infarction without residual deficits: Secondary | ICD-10-CM | POA: Diagnosis not present

## 2021-03-28 DIAGNOSIS — E1159 Type 2 diabetes mellitus with other circulatory complications: Secondary | ICD-10-CM | POA: Diagnosis not present

## 2021-03-28 DIAGNOSIS — E785 Hyperlipidemia, unspecified: Secondary | ICD-10-CM | POA: Diagnosis not present

## 2021-04-01 DIAGNOSIS — Z7982 Long term (current) use of aspirin: Secondary | ICD-10-CM | POA: Diagnosis not present

## 2021-04-01 DIAGNOSIS — E1159 Type 2 diabetes mellitus with other circulatory complications: Secondary | ICD-10-CM | POA: Diagnosis not present

## 2021-04-01 DIAGNOSIS — Z9181 History of falling: Secondary | ICD-10-CM | POA: Diagnosis not present

## 2021-04-01 DIAGNOSIS — I10 Essential (primary) hypertension: Secondary | ICD-10-CM | POA: Diagnosis not present

## 2021-04-01 DIAGNOSIS — H3530 Unspecified macular degeneration: Secondary | ICD-10-CM | POA: Diagnosis not present

## 2021-04-01 DIAGNOSIS — E785 Hyperlipidemia, unspecified: Secondary | ICD-10-CM | POA: Diagnosis not present

## 2021-04-01 DIAGNOSIS — Z87891 Personal history of nicotine dependence: Secondary | ICD-10-CM | POA: Diagnosis not present

## 2021-04-01 DIAGNOSIS — J449 Chronic obstructive pulmonary disease, unspecified: Secondary | ICD-10-CM | POA: Diagnosis not present

## 2021-04-01 DIAGNOSIS — Z7984 Long term (current) use of oral hypoglycemic drugs: Secondary | ICD-10-CM | POA: Diagnosis not present

## 2021-04-01 DIAGNOSIS — Z8673 Personal history of transient ischemic attack (TIA), and cerebral infarction without residual deficits: Secondary | ICD-10-CM | POA: Diagnosis not present

## 2021-04-01 DIAGNOSIS — I2510 Atherosclerotic heart disease of native coronary artery without angina pectoris: Secondary | ICD-10-CM | POA: Diagnosis not present

## 2021-04-01 DIAGNOSIS — Z9981 Dependence on supplemental oxygen: Secondary | ICD-10-CM | POA: Diagnosis not present

## 2021-04-01 DIAGNOSIS — Z85118 Personal history of other malignant neoplasm of bronchus and lung: Secondary | ICD-10-CM | POA: Diagnosis not present

## 2021-04-03 DIAGNOSIS — I2510 Atherosclerotic heart disease of native coronary artery without angina pectoris: Secondary | ICD-10-CM | POA: Diagnosis not present

## 2021-04-03 DIAGNOSIS — Z7982 Long term (current) use of aspirin: Secondary | ICD-10-CM | POA: Diagnosis not present

## 2021-04-03 DIAGNOSIS — E785 Hyperlipidemia, unspecified: Secondary | ICD-10-CM | POA: Diagnosis not present

## 2021-04-03 DIAGNOSIS — Z85118 Personal history of other malignant neoplasm of bronchus and lung: Secondary | ICD-10-CM | POA: Diagnosis not present

## 2021-04-03 DIAGNOSIS — Z8673 Personal history of transient ischemic attack (TIA), and cerebral infarction without residual deficits: Secondary | ICD-10-CM | POA: Diagnosis not present

## 2021-04-03 DIAGNOSIS — H3530 Unspecified macular degeneration: Secondary | ICD-10-CM | POA: Diagnosis not present

## 2021-04-03 DIAGNOSIS — Z9981 Dependence on supplemental oxygen: Secondary | ICD-10-CM | POA: Diagnosis not present

## 2021-04-03 DIAGNOSIS — I10 Essential (primary) hypertension: Secondary | ICD-10-CM | POA: Diagnosis not present

## 2021-04-03 DIAGNOSIS — Z9181 History of falling: Secondary | ICD-10-CM | POA: Diagnosis not present

## 2021-04-03 DIAGNOSIS — Z7984 Long term (current) use of oral hypoglycemic drugs: Secondary | ICD-10-CM | POA: Diagnosis not present

## 2021-04-03 DIAGNOSIS — E1159 Type 2 diabetes mellitus with other circulatory complications: Secondary | ICD-10-CM | POA: Diagnosis not present

## 2021-04-03 DIAGNOSIS — Z87891 Personal history of nicotine dependence: Secondary | ICD-10-CM | POA: Diagnosis not present

## 2021-04-03 DIAGNOSIS — J449 Chronic obstructive pulmonary disease, unspecified: Secondary | ICD-10-CM | POA: Diagnosis not present

## 2021-04-08 ENCOUNTER — Other Ambulatory Visit: Payer: Self-pay | Admitting: Family Medicine

## 2021-04-08 DIAGNOSIS — J9611 Chronic respiratory failure with hypoxia: Secondary | ICD-10-CM | POA: Diagnosis not present

## 2021-04-08 DIAGNOSIS — J432 Centrilobular emphysema: Secondary | ICD-10-CM | POA: Diagnosis not present

## 2021-04-08 DIAGNOSIS — G629 Polyneuropathy, unspecified: Secondary | ICD-10-CM

## 2021-04-08 DIAGNOSIS — Z9181 History of falling: Secondary | ICD-10-CM | POA: Diagnosis not present

## 2021-04-08 NOTE — Telephone Encounter (Signed)
Medication Refill - Medication: 30 day supply  gabapentin (NEURONTIN) 100 MG capsule.   Patient is out and would like PCP to expedite   [id number] - [place], [place] Phone:  [phone]  Fax:  [phone]                 90 day supply sent to           [facility] - [place], [place]             [address] [place], [place] [address]   Has the patient contacted their pharmacy? Yes.    (Agent: If yes, when and what did the pharmacy advise?) Contact PCP office because request was sent in on 12/15  Has the patient been seen for an appointment in the last year OR does the patient have an upcoming appointment? Yes.    Agent: Please be advised that RX refills may take up to 3 business days. We ask that you follow-up with your pharmacy.

## 2021-04-09 DIAGNOSIS — E1159 Type 2 diabetes mellitus with other circulatory complications: Secondary | ICD-10-CM | POA: Diagnosis not present

## 2021-04-09 DIAGNOSIS — I10 Essential (primary) hypertension: Secondary | ICD-10-CM | POA: Diagnosis not present

## 2021-04-09 DIAGNOSIS — H3530 Unspecified macular degeneration: Secondary | ICD-10-CM | POA: Diagnosis not present

## 2021-04-09 DIAGNOSIS — Z85118 Personal history of other malignant neoplasm of bronchus and lung: Secondary | ICD-10-CM | POA: Diagnosis not present

## 2021-04-09 DIAGNOSIS — I2510 Atherosclerotic heart disease of native coronary artery without angina pectoris: Secondary | ICD-10-CM | POA: Diagnosis not present

## 2021-04-09 DIAGNOSIS — E785 Hyperlipidemia, unspecified: Secondary | ICD-10-CM | POA: Diagnosis not present

## 2021-04-09 DIAGNOSIS — Z8673 Personal history of transient ischemic attack (TIA), and cerebral infarction without residual deficits: Secondary | ICD-10-CM | POA: Diagnosis not present

## 2021-04-09 DIAGNOSIS — Z87891 Personal history of nicotine dependence: Secondary | ICD-10-CM | POA: Diagnosis not present

## 2021-04-09 DIAGNOSIS — Z7984 Long term (current) use of oral hypoglycemic drugs: Secondary | ICD-10-CM | POA: Diagnosis not present

## 2021-04-09 DIAGNOSIS — J449 Chronic obstructive pulmonary disease, unspecified: Secondary | ICD-10-CM | POA: Diagnosis not present

## 2021-04-09 DIAGNOSIS — Z7982 Long term (current) use of aspirin: Secondary | ICD-10-CM | POA: Diagnosis not present

## 2021-04-09 DIAGNOSIS — Z9181 History of falling: Secondary | ICD-10-CM | POA: Diagnosis not present

## 2021-04-09 DIAGNOSIS — Z9981 Dependence on supplemental oxygen: Secondary | ICD-10-CM | POA: Diagnosis not present

## 2021-04-09 MED ORDER — GABAPENTIN 100 MG PO CAPS: 100.0000 mg | ORAL_CAPSULE | Freq: Two times a day (BID) | ORAL | 0 refills | Status: AC

## 2021-04-09 NOTE — Telephone Encounter (Signed)
Requested medication (s) are due for refill today: yes  Requested medication (s) are on the active medication list: yes  Last refill:  12/06/20 #60 0 refills  Future visit scheduled: no   Notes to clinic:  last ordered by Burr Medico, NP . Do you want to refill Rx?     Requested Prescriptions  Pending Prescriptions Disp Refills   gabapentin (NEURONTIN) 100 MG capsule 60 capsule 0    Sig: Take 1 capsule (100 mg total) by mouth 2 (two) times daily.     Neurology: Anticonvulsants - gabapentin Passed - 04/09/2021  3:01 PM      Passed - Valid encounter within last 12 months    Recent Outpatient Visits           3 months ago Need for influenza vaccination   [facility] Jerrol Banana., MD   6 months ago Cellulitis of finger, unspecified laterality   [facility] Jerrol Banana., MD   7 months ago [place] Rosanna Randy, Retia Passe., MD   9 months ago Skin ulcer of sacrum, limited to breakdown of skin [facility])   [facility] Carles Collet [place], [place]   12 months ago Encounter for immunization   [place], Retia Passe., MD

## 2021-04-09 NOTE — Telephone Encounter (Signed)
LOV:  12/19/2020

## 2021-04-10 ENCOUNTER — Emergency Department (HOSPITAL_COMMUNITY): Payer: Medicare Other

## 2021-04-10 ENCOUNTER — Other Ambulatory Visit: Payer: Self-pay

## 2021-04-10 ENCOUNTER — Emergency Department (HOSPITAL_COMMUNITY)
Admission: EM | Admit: 2021-04-10 | Discharge: 2021-04-11 | Disposition: A | Discharge status: Home / Self Care | Payer: Medicare Other | Attending: Emergency Medicine | Admitting: Emergency Medicine

## 2021-04-10 ENCOUNTER — Encounter (HOSPITAL_COMMUNITY): Payer: Self-pay | Admitting: Emergency Medicine

## 2021-04-10 DIAGNOSIS — Z85118 Personal history of other malignant neoplasm of bronchus and lung: Secondary | ICD-10-CM | POA: Insufficient documentation

## 2021-04-10 DIAGNOSIS — E119 Type 2 diabetes mellitus without complications: Secondary | ICD-10-CM | POA: Diagnosis not present

## 2021-04-10 DIAGNOSIS — E039 Hypothyroidism, unspecified: Secondary | ICD-10-CM | POA: Insufficient documentation

## 2021-04-10 DIAGNOSIS — I2510 Atherosclerotic heart disease of native coronary artery without angina pectoris: Secondary | ICD-10-CM | POA: Insufficient documentation

## 2021-04-10 DIAGNOSIS — R410 Disorientation, unspecified: Secondary | ICD-10-CM | POA: Diagnosis not present

## 2021-04-10 DIAGNOSIS — Y900 Blood alcohol level of less than 20 mg/100 ml: Secondary | ICD-10-CM | POA: Insufficient documentation

## 2021-04-10 DIAGNOSIS — I6523 Occlusion and stenosis of bilateral carotid arteries: Secondary | ICD-10-CM | POA: Diagnosis not present

## 2021-04-10 DIAGNOSIS — J441 Chronic obstructive pulmonary disease with (acute) exacerbation: Secondary | ICD-10-CM | POA: Diagnosis not present

## 2021-04-10 DIAGNOSIS — I10 Essential (primary) hypertension: Secondary | ICD-10-CM | POA: Diagnosis not present

## 2021-04-10 DIAGNOSIS — R4781 Slurred speech: Secondary | ICD-10-CM | POA: Diagnosis not present

## 2021-04-10 DIAGNOSIS — Z7982 Long term (current) use of aspirin: Secondary | ICD-10-CM | POA: Diagnosis not present

## 2021-04-10 DIAGNOSIS — R001 Bradycardia, unspecified: Secondary | ICD-10-CM | POA: Diagnosis not present

## 2021-04-10 DIAGNOSIS — R531 Weakness: Secondary | ICD-10-CM | POA: Diagnosis not present

## 2021-04-10 DIAGNOSIS — R6889 Other general symptoms and signs: Secondary | ICD-10-CM | POA: Diagnosis not present

## 2021-04-10 DIAGNOSIS — Z8616 Personal history of COVID-19: Secondary | ICD-10-CM | POA: Insufficient documentation

## 2021-04-10 DIAGNOSIS — I499 Cardiac arrhythmia, unspecified: Secondary | ICD-10-CM | POA: Diagnosis not present

## 2021-04-10 DIAGNOSIS — Z79899 Other long term (current) drug therapy: Secondary | ICD-10-CM | POA: Diagnosis not present

## 2021-04-10 DIAGNOSIS — R5383 Other fatigue: Secondary | ICD-10-CM | POA: Diagnosis present

## 2021-04-10 DIAGNOSIS — Z743 Need for continuous supervision: Secondary | ICD-10-CM | POA: Diagnosis not present

## 2021-04-10 DIAGNOSIS — R29818 Other symptoms and signs involving the nervous system: Secondary | ICD-10-CM | POA: Diagnosis not present

## 2021-04-10 DIAGNOSIS — Z7952 Long term (current) use of systemic steroids: Secondary | ICD-10-CM | POA: Insufficient documentation

## 2021-04-10 LAB — CBC
HCT: 32.7 % — ABNORMAL LOW (ref 36.0–46.0)
Hemoglobin: 10.3 g/dL — ABNORMAL LOW (ref 12.0–15.0)
MCH: 30.3 pg (ref 26.0–34.0)
MCHC: 31.5 g/dL (ref 30.0–36.0)
MCV: 96.2 fL (ref 80.0–100.0)
Platelets: 225 10*3/uL (ref 150–400)
RBC: 3.4 MIL/uL — ABNORMAL LOW (ref 3.87–5.11)
RDW: 16.5 % — ABNORMAL HIGH (ref 11.5–15.5)
WBC: 8.2 10*3/uL (ref 4.0–10.5)
nRBC: 0 % (ref 0.0–0.2)

## 2021-04-10 LAB — COMPREHENSIVE METABOLIC PANEL
ALT: 17 U/L (ref 0–44)
AST: 16 U/L (ref 15–41)
Albumin: 3.8 g/dL (ref 3.5–5.0)
Alkaline Phosphatase: 71 U/L (ref 38–126)
Anion gap: 7 (ref 5–15)
BUN: 25 mg/dL — ABNORMAL HIGH (ref 8–23)
CO2: 35 mmol/L — ABNORMAL HIGH (ref 22–32)
Calcium: 9.3 mg/dL (ref 8.9–10.3)
Chloride: 97 mmol/L — ABNORMAL LOW (ref 98–111)
Creatinine, Ser: 1.49 mg/dL — ABNORMAL HIGH (ref 0.44–1.00)
GFR, Estimated: 36 mL/min — ABNORMAL LOW (ref >60)
Glucose, Bld: 84 mg/dL (ref 70–99)
Potassium: 5 mmol/L (ref 3.5–5.1)
Sodium: 139 mmol/L (ref 135–145)
Total Bilirubin: 0.9 mg/dL (ref 0.3–1.2)
Total Protein: 7.5 g/dL (ref 6.5–8.1)

## 2021-04-10 LAB — PROTIME-INR
INR: 1 (ref 0.8–1.2)
Prothrombin Time: 12.8 seconds (ref 11.4–15.2)

## 2021-04-10 LAB — DIFFERENTIAL
Abs Immature Granulocytes: 0.03 10*3/uL (ref 0.00–0.07)
Basophils Absolute: 0 10*3/uL (ref 0.0–0.1)
Basophils Relative: 0 %
Eosinophils Absolute: 0.3 10*3/uL (ref 0.0–0.5)
Eosinophils Relative: 3 %
Immature Granulocytes: 0 %
Lymphocytes Relative: 11 %
Lymphs Abs: 0.9 10*3/uL (ref 0.7–4.0)
Monocytes Absolute: 0.8 10*3/uL (ref 0.1–1.0)
Monocytes Relative: 9 %
Neutro Abs: 6.3 10*3/uL (ref 1.7–7.7)
Neutrophils Relative %: 77 %

## 2021-04-10 LAB — ETHANOL: Alcohol, Ethyl (B): <10 mg/dL (ref <10)

## 2021-04-10 LAB — APTT: aPTT: 29 seconds (ref 24–36)

## 2021-04-10 MED ORDER — SODIUM CHLORIDE 0.9 % IV BOLUS
1000.0000 mL | Freq: Once | INTRAVENOUS | Status: AC
  Administered 2021-04-10: 1000 mL via INTRAVENOUS

## 2021-04-10 MED ORDER — SODIUM CHLORIDE 0.9 % IV SOLN: Freq: Once | INTRAVENOUS | Status: AC

## 2021-04-10 NOTE — ED Triage Notes (Signed)
Patient with increased confusion in the last 4 days.  Patient had new onset of slurred speech and facial droop at 8pm tonight, which resolved by the time EMS arrived to home. Patient does have some dementia.  History of CVA.  No last deficits from previous stroke.

## 2021-04-10 NOTE — ED Provider Notes (Signed)
[name] EMERGENCY DEPARTMENT Provider Note  CSN: [id number] Arrival date & time: 04/10/21 2138  Chief Complaint(s) Stroke Symptoms  HPI [name] [name] is a 81 y.o. female with extensive past medical history listed below including hypertension, hyperlipidemia, diabetes, COPD on 3 L nasal cannula, prior stroke, cognitive deficit/dementia who presents to the emergency department for several days of generalized fatigue and possible strokelike symptoms.  EMS was told that the patient might of had a slight facial droop and slurred speech noted today.  I called and spoke with the patient's husband who reports not really noticing this facial droop or slurred speech.  He did report that the patient has been more fatigued and less active than usual.  She has had episodes like this in the past related to dehydration or urinary tract infections.  He reports that her food intake has decreased in the past 2 to 3 weeks.  He denies any known fevers or infections.  Patient has a baseline cough but no acute changes.  No nausea or vomiting.  No diarrhea.  No other known physical complaints.  Patient is currently alert and oriented x3 though does not know the year but does know the month.  Currently she denies any physical complaints.   HPI  Past Medical History Past Medical History:  Diagnosis Date   Abnormal CT lung screening 10/17/2015   COPD (chronic obstructive pulmonary disease) (HCC)    Coronary artery disease, non-occlusive    a. cath 2006: min nonobs CAD; b. cath 12/2010: cath LAD 50%, RCA 60%; c. 08/2013: Minimal luminal irregs, right dominant system with no significant CAD, diffuse luminal irregs noted. Normal EF 55%, no AS or MS.    Diabetes mellitus    Hyperlipemia    Followed by Dr. Rosanna Randy   Hypertension    Lung cancer [facility])    Macular degeneration    rt   Personal history of tobacco use, presenting hazards to health 10/15/2015   Stroke [facility])    Patient Active Problem  List   Diagnosis Date Noted   Neurocognitive deficits 12/02/2020   Syncope and collapse 11/25/2020   Weakness of both legs 11/24/2020   Fall at home, initial encounter 11/24/2020   Generalized weakness 07/31/2020   COVID-19 virus detected 07/31/2020   Type 2 diabetes mellitus with stage 3 chronic kidney disease ([place]) 07/31/2020   History of anemia due to CKD 07/31/2020   Neutropenia ([place]) 09/09/2019   Drug-induced polyneuropathy ([place]) 09/09/2019   Inflammatory spondylopathy of lumbosacral region ([place]) 09/09/2019   COPD (chronic obstructive pulmonary disease) ([place]) 06/06/2019   GI bleed 12/28/2018   Syncope 12/27/2018   Blood per rectum 12/27/2018   Constipation 12/27/2018   Chest pain 09/23/2018   Stroke (cerebrum) ([place]) 05/06/2018   Malnutrition of moderate degree 03/31/2017   Acute respiratory failure with hypoxia ([place]) 03/30/2017   AKI (acute kidney injury) ([place]) 07/15/2016   Protein-calorie malnutrition, severe 02/08/2016   Primary cancer of right upper lobe of lung ([place]) 11/20/2015   Abnormal CT lung screening 10/17/2015   Personal history of tobacco use, presenting hazards to health 10/15/2015   Chronic vulvitis 09/26/2014   Allergic reaction 09/26/2014   Carotid stenosis 08/30/2014   Cervical nerve root disorder 08/10/2014   CAD in native artery 08/10/2014   B12 deficiency 08/10/2014   Back ache 08/10/2014   Bronchitis, chronic ([place]) 08/10/2014   Diabetes mellitus with polyneuropathy ([place]) 08/10/2014   Can't get food down 08/10/2014   Eczema of external ear 08/10/2014  Accumulation of fluid in tissues 08/10/2014   Gout 08/10/2014   Adult hypothyroidism 08/10/2014   Mononeuritis 08/10/2014   Muscle ache 08/10/2014   Disorder of peripheral nervous system 08/10/2014   Lesion of vulva 08/10/2014   Cervical spondylosis with radiculopathy 10/19/2013   COPD exacerbation ([place]) 03/29/2013   CAD (coronary artery disease) 06/22/2011   COPD (chronic obstructive pulmonary  disease) with emphysema ([place]) 03/07/2010   CHEST PAIN UNSPECIFIED 07/22/2009   HLD (hyperlipidemia) 04/01/2009   Malaise and fatigue 04/01/2009   TOBACCO ABUSE 01/18/2009   HYPERTENSION, BENIGN 01/18/2009   CLAUDICATION 01/18/2009   Pain in limb 01/18/2009   CAFL (chronic airflow limitation) ([place]) 01/20/2007   Late effects of cerebrovascular disease 01/10/2007   Essential (primary) hypertension 12/22/2006   Home Medication(s) Prior to Admission medications   Medication Sig Start Date End Date Taking? Authorizing Provider  ACCU-CHEK GUIDE test strip CHECK BLOOD SUGAR ONCE  DAILY 03/07/21   Jerrol Banana., MD  albuterol (PROVENTIL) (2.5 MG/3ML) 0.083% nebulizer solution Take 3 mLs (2.5 mg total) by nebulization every 6 (six) hours as needed for wheezing or shortness of breath. 12/06/20   Medina-Vargas, Monina C, NP  amLODipine (NORVASC) 5 MG tablet TAKE 1 TABLET BY MOUTH EVERY DAY 01/30/21   Jerrol Banana., MD  aspirin 81 MG EC tablet Take 1 tablet (81 mg total) by mouth daily. 12/06/20   Medina-Vargas, Monina C, NP  atorvastatin (LIPITOR) 40 MG tablet Take 1 tablet (40 mg total) by mouth daily. 03/21/21   Birdie Sons, MD  benzonatate (TESSALON) 100 MG capsule Take 2 capsules (200 mg total) by mouth 3 (three) times daily as needed for cough. 01/17/21   Birdie Sons, MD  bisacodyl (DULCOLAX) 10 MG suppository Place 10 mg rectally as needed for moderate constipation.    [provider]  clopidogrel (PLAVIX) 75 MG tablet TAKE 1 TABLET BY MOUTH  DAILY 02/24/21   Jerrol Banana., MD  donepezil (ARICEPT) 5 MG tablet TAKE 1 TABLET BY MOUTH EVERYDAY AT BEDTIME 03/12/21   Jerrol Banana., MD  gabapentin (NEURONTIN) 100 MG capsule Take 1 capsule (100 mg total) by mouth 2 (two) times daily. 04/09/21   Gwyneth Sprout, FNP  guaiFENesin (MUCINEX) 600 MG 12 hr tablet Take 1 tablet (600 mg total) by mouth 2 (two) times daily. 12/06/20   Medina-Vargas, Monina C, NP   Ipratropium-Albuterol (COMBIVENT) 20-100 MCG/ACT AERS respimat Inhale 1 puff into the lungs every 6 (six) hours. 12/06/20   Medina-Vargas, Monina C, NP  Magnesium Hydroxide (MILK OF MAGNESIA PO) Take 30 mLs by mouth as needed.    [provider]  magnesium oxide (MAG-OX) 400 MG tablet Take 1 tablet (400 mg total) by mouth 2 (two) times daily. 12/06/20   Medina-Vargas, Monina C, NP  metoprolol succinate (TOPROL-XL) 100 MG 24 hr tablet Take 100 mg by mouth daily. 12/06/20   [provider]  metoprolol succinate (TOPROL-XL) 50 MG 24 hr tablet Take 1 tablet (50 mg total) by mouth daily. Take with or immediately following a meal. 12/06/20   Medina-Vargas, Monina C, NP  Multiple Vitamin (MULTIVITAMIN) tablet Take 0.5 tablets by mouth 2 (two) times daily.    [provider]  pantoprazole (PROTONIX) 40 MG tablet Take 1 tablet (40 mg total) by mouth 2 (two) times daily. 12/06/20   Medina-Vargas, Monina C, NP  senna-docusate (SENOKOT-S) 8.6-50 MG tablet Take 2 tablets by mouth 2 (two) times daily. 12/06/20   Medina-Vargas,  Monina C, NP  Sodium Phosphates (RA SALINE ENEMA RE) Place rectally as needed.    [provider]  VITAMIN D PO Take by mouth daily.     [provider]  vitamin E 180 MG (400 UNITS) capsule Take 400 Units by mouth daily.    [provider]  zinc sulfate 220 (50 Zn) MG capsule Take 1 capsule (220 mg total) by mouth daily. 12/06/20   Medina-Vargas, Monina C, NP                                                                                                                                    Allergies Coconut fatty acids  Review of Systems Review of Systems As noted in HPI  Physical Exam Vital Signs  I have reviewed the triage vital signs BP (!) 118/53    Pulse (!) 51    Temp 97.8 F (36.6 C)    Resp 15    SpO2 100%   Physical Exam Vitals reviewed.  Constitutional:      General: She is not in acute distress.    Appearance: She is  well-developed. She is not diaphoretic.  HENT:     Head: Normocephalic and atraumatic.     Nose: Nose normal.  Eyes:     General: No scleral icterus.       Right eye: No discharge.        Left eye: No discharge.     Conjunctiva/sclera: Conjunctivae normal.     Pupils: Pupils are equal, round, and reactive to light.  Cardiovascular:     Rate and Rhythm: Normal rate and regular rhythm.     Heart sounds: No murmur heard.   No friction rub. No gallop.  Pulmonary:     Effort: Pulmonary effort is normal. No respiratory distress.     Breath sounds: Normal breath sounds. No stridor. No rales.  Abdominal:     General: There is no distension.     Palpations: Abdomen is soft.     Tenderness: There is no abdominal tenderness.  Musculoskeletal:        General: No tenderness.     Cervical back: Normal range of motion and neck supple.  Skin:    General: Skin is warm and dry.     Findings: No erythema or rash.  Neurological:     Mental Status: She is alert and oriented to person, place, and time.     Comments: Mental Status:  Alert and oriented to person, place, and time (month, not year)  Attention and concentration slowed Speech dysarthric.   Cranial Nerves:  II Visual Fields: Intact to confrontation. Visual fields intact. III, IV, VI: Pupils equal and reactive to light and near. Full eye movement without nystagmus  V Facial Sensation: Normal. No weakness of masticatory muscles  VII: No facial weakness or asymmetry  VIII Auditory Acuity: Grossly normal  IX/X: The uvula is midline; the  palate elevates symmetrically  XI: Normal sternocleidomastoid and trapezius strength  XII: The tongue is midline. No atrophy or fasciculations.   Motor System: Muscle Strength: 5/5 and symmetric in the upper and lower extremities. No pronation or drift.  Muscle Tone: Tone and muscle bulk are normal in the upper and lower extremities.  Coordination:  No tremor.  Sensation: Intact to light touch, and  pinprick  Gait: deferred     ED Results and Treatments Labs (all labs ordered are listed, but only abnormal results are displayed) Labs Reviewed  CBC - Abnormal; Notable for the following components:      Result Value   RBC 3.40 (*)    Hemoglobin 10.3 (*)    HCT 32.7 (*)    RDW 16.5 (*)    All other components within normal limits  COMPREHENSIVE METABOLIC PANEL - Abnormal; Notable for the following components:   Chloride 97 (*)    CO2 35 (*)    BUN 25 (*)    Creatinine, Ser 1.49 (*)    GFR, Estimated 36 (*)    All other components within normal limits  URINALYSIS, ROUTINE W REFLEX MICROSCOPIC - Abnormal; Notable for the following components:   APPearance HAZY (*)    Protein, ur 100 (*)    All other components within normal limits  I-STAT CHEM 8, ED - Abnormal; Notable for the following components:   BUN 31 (*)    Creatinine, Ser 1.60 (*)    Glucose, Bld 67 (*)    TCO2 36 (*)    Hemoglobin 10.5 (*)    HCT 31.0 (*)    All other components within normal limits  CBG MONITORING, ED - Abnormal; Notable for the following components:   Glucose-Capillary 54 (*)    All other components within normal limits  PROTIME-INR  APTT  DIFFERENTIAL  ETHANOL  CBG MONITORING, ED  CBG MONITORING, ED                                                                                                                         EKG  EKG Interpretation  Date/Time:  Thursday April 10 2021 21:52:18 EST Ventricular Rate:  55 PR Interval:  96 QRS Duration: 82 QT Interval:  440 QTC Calculation: 420 R Axis:   27 Text Interpretation: Sinus bradycardia with short PR Otherwise normal ECG No acute changes When compared with ECG of 24-Nov-2020 11:37, PREVIOUS ECG IS PRESENT Confirmed by Addison Lank [id number]) on 04/10/2021 11:34:06 PM       Radiology CT Head Wo Contrast  Result Date: 04/10/2021 CLINICAL DATA:  Neuro deficit, acute, stroke suspected EXAM: CT HEAD WITHOUT CONTRAST TECHNIQUE: Contiguous  axial images were obtained from the base of the skull through the vertex without intravenous contrast. COMPARISON:  None. BRAIN: BRAIN Cerebral ventricle sizes are concordant with the degree of cerebral volume loss. Patchy and confluent areas of decreased attenuation are noted throughout the deep and periventricular white matter of the cerebral hemispheres bilaterally, compatible  with chronic microvascular ischemic disease. No evidence of large-territorial acute infarction. No parenchymal hemorrhage. No mass lesion. No extra-axial collection. No mass effect or midline shift. No hydrocephalus. Basilar cisterns are patent. Vascular: No hyperdense vessel. Atherosclerotic calcifications are present within the cavernous internal carotid arteries. Skull: No acute fracture or focal lesion. Sinuses/Orbits: Mild bilateral sphenoid mucosal thickening. Otherwise paranasal sinuses and mastoid air cells are clear. Left lens replacement. Otherwise the orbits are unremarkable. Other: None. IMPRESSION: No acute intracranial abnormality. Electronically Signed   By: Iven Finn M.D.   On: 04/10/2021 22:49   MR BRAIN WO CONTRAST  Result Date: 04/11/2021 CLINICAL DATA:  81 year old female with slurred speech. Neurologic deficit. EXAM: MRI HEAD WITHOUT CONTRAST TECHNIQUE: Multiplanar, multiecho pulse sequences of the brain and surrounding structures were obtained without intravenous contrast. COMPARISON:  Head CT 04/10/2021.  Brain MRI and MRA 05/06/2018. FINDINGS: Brain: T2 shine through posterior left centrum semi of bowel, in the setting of patchy chronic bilateral cerebral white matter T2 and FLAIR hyperintensity. Still, posterior left MCA territory white matter disease has progressed since 2020 (series 11, image 18). But no convincing restricted diffusion to suggest acute infarction. No midline shift, mass effect, evidence of mass lesion, ventriculomegaly, extra-axial collection or acute intracranial hemorrhage.  Cervicomedullary junction and pituitary are within normal limits. Expected evolution of the 2020 right thalamic lacunar infarct. Chronic lacunar infarct right caudate also. No cortical encephalomalacia identified. Occasional chronic microhemorrhage on SWI is stable. Patchy T2 heterogeneity in the right central pons is stable and might in part be Wallerian degeneration. Scattered small chronic cerebellar lacunar infarcts are stable. Vascular: Major intracranial vascular flow voids are stable. Skull and upper cervical spine: Negative. Sinuses/Orbits: Stable orbits. Mild chronic fluid or mucosal thickening in the sphenoid sinuses has not significantly changed. Other: Stable mild mastoid effusions, greater on the left. Negative visible nasopharynx. Visible internal auditory structures appear normal. Negative visible face and scalp. IMPRESSION: 1. No acute intracranial abnormality. 2. Fairly advanced chronic small vessel disease, progressed in the posterior left MCA white matter since 2020, but stable elsewhere. Electronically Signed   By: Genevie Ann M.D.   On: 04/11/2021 05:36    Pertinent labs & imaging results that were available during my care of the patient were reviewed by me and considered in my medical decision making (see MDM for details).  Medications Ordered in ED Medications  sodium chloride 0.9 % bolus 1,000 mL (1,000 mLs Intravenous New Bag/Given 04/10/21 2357)  0.9 %  sodium chloride infusion ( Intravenous New Bag/Given 04/11/21 0350)                                                                                                                                     Procedures Procedures  (including critical care time)  Medical Decision Making / ED Course     Patient presented for several days of generalized fatigue in the setting of decreased oral intake.  No emesis.  No diarrhea. Afebrile with stable vital signs. Question of possible facial droop and slurred speech (which has been  denied).  Work-up as above independently interpreted by me No leukocytosis or evidence of infectious process.  UA without evidence of infection. Metabolic panel notable for mild AKI.  Also notable for mild hypoglycemia  CT head negative for ICH or mass-effect. MRI obtained to rule out stroke which was negative.   Management:  Hypoglycemia Patient provided with food and drink. Able to tolerate oral intake. Blood sugar improved. Mild AKI Likely secondary to dehydration Provided with IV fluids Able to tolerate oral intake and can hydrate at home   Patient does not require admission to the hospital for continued hydration or management.    Final Clinical Impression(s) / ED Diagnoses Final diagnoses:  Weakness   The patient appears reasonably screened and/or stabilized for discharge and I doubt any other medical condition or other [employer] [facility] [employer] requiring further screening, evaluation, or treatment in the ED at this time prior to discharge. Safe for discharge with strict return precautions.  Disposition: Discharge  Condition: Good  I have discussed the results, Dx and Tx plan with the patient/family who expressed understanding and agree(s) with the plan. Discharge instructions discussed at length. The patient/family was given strict return precautions who verbalized understanding of the instructions. No further questions at time of discharge.    ED Discharge Orders     None       Follow Up: Jerrol Banana., MD [address] [place] [address] [phone]  Call  to schedule an appointment for close follow up           This chart was dictated using voice recognition software.  Despite best efforts to proofread,  errors can occur which can change the documentation meaning.    Fatima Blank, MD 04/11/21 [phone]

## 2021-04-10 NOTE — ED Notes (Signed)
Lisabeth Devoid, daughter, [phone] would like an update when available

## 2021-04-10 NOTE — ED Provider Triage Note (Signed)
Emergency Medicine Provider Triage Evaluation Note  [name] [name] , a 81 y.o. female  was evaluated in triage.  Pt complains of slurred speech.  Patient reports that she had a period of slurred speech earlier today.  Per chart review patient slurred speech started at 8 PM.  Patient reports that symptoms lasted for a short while.    Review of Systems  Positive: Slurred speech Negative: Numbness, weakness, facial asymmetry  Physical Exam  BP (!) 158/56 (BP Location: Right Arm)    Pulse (!) 59    Temp 97.8 F (36.6 C)    Resp 19    SpO2 100%  Gen:   Awake, no distress   Resp:  Normal effort  MSK:   Moves extremities without difficulty  Other:  No facial asymmetry or dysarthria.  Patient moves all limbs equally without difficulty.  Medical Decision Making  Medically screening exam initiated at 10:03 PM.  Appropriate orders placed.  Tennie D Bralley was informed that the remainder of the evaluation will be completed by another provider, this initial triage assessment does not replace that evaluation, and the importance of remaining in the ED until their evaluation is complete.  TIA work-up initiated   Dyann Ruddle 04/10/21 2212

## 2021-04-11 ENCOUNTER — Emergency Department (HOSPITAL_COMMUNITY): Payer: Medicare Other

## 2021-04-11 ENCOUNTER — Telehealth: Payer: Self-pay | Admitting: Family Medicine

## 2021-04-11 DIAGNOSIS — R6889 Other general symptoms and signs: Secondary | ICD-10-CM | POA: Diagnosis not present

## 2021-04-11 DIAGNOSIS — Z7401 Bed confinement status: Secondary | ICD-10-CM | POA: Diagnosis not present

## 2021-04-11 DIAGNOSIS — R531 Weakness: Secondary | ICD-10-CM | POA: Diagnosis not present

## 2021-04-11 DIAGNOSIS — R4781 Slurred speech: Secondary | ICD-10-CM | POA: Diagnosis not present

## 2021-04-11 DIAGNOSIS — R29818 Other symptoms and signs involving the nervous system: Secondary | ICD-10-CM | POA: Diagnosis not present

## 2021-04-11 DIAGNOSIS — I499 Cardiac arrhythmia, unspecified: Secondary | ICD-10-CM | POA: Diagnosis not present

## 2021-04-11 LAB — I-STAT CHEM 8, ED
BUN: 31 mg/dL — ABNORMAL HIGH (ref 8–23)
Calcium, Ion: 1.16 mmol/L (ref 1.15–1.40)
Chloride: 100 mmol/L (ref 98–111)
Creatinine, Ser: 1.6 mg/dL — ABNORMAL HIGH (ref 0.44–1.00)
Glucose, Bld: 67 mg/dL — ABNORMAL LOW (ref 70–99)
HCT: 31 % — ABNORMAL LOW (ref 36.0–46.0)
Hemoglobin: 10.5 g/dL — ABNORMAL LOW (ref 12.0–15.0)
Potassium: 4.9 mmol/L (ref 3.5–5.1)
Sodium: 139 mmol/L (ref 135–145)
TCO2: 36 mmol/L — ABNORMAL HIGH (ref 22–32)

## 2021-04-11 LAB — URINALYSIS, ROUTINE W REFLEX MICROSCOPIC
Bacteria, UA: NONE SEEN
Bilirubin Urine: NEGATIVE
Glucose, UA: NEGATIVE mg/dL
Hgb urine dipstick: NEGATIVE
Ketones, ur: NEGATIVE mg/dL
Leukocytes,Ua: NEGATIVE
Nitrite: NEGATIVE
Protein, ur: 100 mg/dL — AB
Specific Gravity, Urine: 1.021 (ref 1.005–1.030)
pH: 5 (ref 5.0–8.0)

## 2021-04-11 LAB — CBG MONITORING, ED
Glucose-Capillary: 71 mg/dL (ref 70–99)
Glucose-Capillary: 54 mg/dL — ABNORMAL LOW (ref 70–99)
Glucose-Capillary: 91 mg/dL (ref 70–99)

## 2021-04-11 NOTE — Telephone Encounter (Signed)
Caller checking on the status of order for hospice/attending physician form and would like form faxed back today. Caller dropped off forms on 04/10/2021 at the front desk.

## 2021-04-11 NOTE — ED Notes (Signed)
Called ptar for pt no eta given

## 2021-04-11 NOTE — Telephone Encounter (Signed)
Forms faxed by [facility] advised.

## 2021-04-11 NOTE — Discharge Instructions (Addendum)
Please ensure the patient is eating and hydrating well. Her work-up here showed evidence of mild dehydration. There was no evidence of infectious process including urinary tract infection. Her MRI was negative for strokes.

## 2021-04-14 ENCOUNTER — Encounter: Payer: Self-pay | Admitting: *Deleted

## 2021-04-22 ENCOUNTER — Ambulatory Visit: Payer: Medicare Other | Admitting: Family Medicine

## 2021-04-25 DIAGNOSIS — J449 Chronic obstructive pulmonary disease, unspecified: Secondary | ICD-10-CM | POA: Diagnosis not present

## 2021-04-25 DIAGNOSIS — R0602 Shortness of breath: Secondary | ICD-10-CM | POA: Diagnosis not present

## 2021-04-27 DIAGNOSIS — J449 Chronic obstructive pulmonary disease, unspecified: Secondary | ICD-10-CM | POA: Diagnosis not present

## 2021-05-12 ENCOUNTER — Other Ambulatory Visit: Payer: Medicare Other

## 2021-05-12 ENCOUNTER — Other Ambulatory Visit: Payer: Self-pay

## 2021-05-12 DIAGNOSIS — Z515 Encounter for palliative care: Secondary | ICD-10-CM

## 2021-05-12 NOTE — Progress Notes (Signed)
PATIENT NAME: [name] [name] DOB: 14-Jan-1941 MRN: [id number]  PRIMARY CARE PROVIDER: Jerrol Banana., MD  RESPONSIBLE PARTY:  Acct ID - Guarantor Home Phone Work Phone Relationship Acct Type  [id number] [name], [name]* [phone]  Self P/F     [place], [place], [place] [id number]    Palliative care RN telephonic encounter completed with patient's husband. Per husband, due to patient decline- increasing weakness and functional decline. Patient was referred to Hospice. He is unsure of the name of the agency that is following her but states the nurse comes out twice a week. Explained to patient's husband that Palliative care and Hospice can not both be involved with patient. Plan is for Palliative Care to sign off at this time.

## 2021-05-26 DIAGNOSIS — R0602 Shortness of breath: Secondary | ICD-10-CM | POA: Diagnosis not present

## 2021-05-26 DIAGNOSIS — J449 Chronic obstructive pulmonary disease, unspecified: Secondary | ICD-10-CM | POA: Diagnosis not present

## 2021-05-28 DIAGNOSIS — J449 Chronic obstructive pulmonary disease, unspecified: Secondary | ICD-10-CM | POA: Diagnosis not present

## 2021-06-09 ENCOUNTER — Other Ambulatory Visit (INDEPENDENT_AMBULATORY_CARE_PROVIDER_SITE_OTHER): Payer: Self-pay | Admitting: Vascular Surgery

## 2021-06-09 DIAGNOSIS — I6523 Occlusion and stenosis of bilateral carotid arteries: Secondary | ICD-10-CM

## 2021-06-10 ENCOUNTER — Encounter (INDEPENDENT_AMBULATORY_CARE_PROVIDER_SITE_OTHER): Payer: Medicare Other

## 2021-06-10 ENCOUNTER — Ambulatory Visit (INDEPENDENT_AMBULATORY_CARE_PROVIDER_SITE_OTHER): Payer: Medicare Other | Admitting: Vascular Surgery

## 2021-06-14 ENCOUNTER — Other Ambulatory Visit: Payer: Self-pay | Admitting: Family Medicine

## 2021-06-14 DIAGNOSIS — F0150 Vascular dementia without behavioral disturbance: Secondary | ICD-10-CM

## 2021-06-17 ENCOUNTER — Other Ambulatory Visit: Payer: Self-pay | Admitting: Family Medicine

## 2021-06-17 DIAGNOSIS — Z8719 Personal history of other diseases of the digestive system: Secondary | ICD-10-CM

## 2021-06-18 NOTE — Telephone Encounter (Signed)
Requested Prescriptions  ?Pending Prescriptions Disp Refills  ?? pantoprazole (PROTONIX) 40 MG tablet [Pharmacy Med Name: Pantoprazole Sodium 40 MG Oral Tablet Delayed Release] 90 tablet 1  ?  Sig: TAKE 1 TABLET BY MOUTH  DAILY  ?  ? Gastroenterology: Proton Pump Inhibitors Passed - 06/17/2021 10:26 PM  ?  ?  Passed - Valid encounter within last 12 months  ?  Recent Outpatient Visits   ?      ? 6 months ago Need for influenza vaccination  ? [facility] Jerrol Banana., MD  ? 9 months ago Cellulitis of finger, unspecified laterality  ? [facility] Jerrol Banana., MD  ? 9 months ago COVID-19  ? [facility] Jerrol Banana., MD  ? 11 months ago Skin ulcer of sacrum, limited to breakdown of skin [facility])  ? [facility] [place], [place], [place]  ? 1 year ago Encounter for immunization  ? [facility] Jerrol Banana., MD  ?  ?  ? ?  ?  ?  ? ?

## 2021-06-20 ENCOUNTER — Telehealth: Payer: Self-pay

## 2021-06-23 DIAGNOSIS — J449 Chronic obstructive pulmonary disease, unspecified: Secondary | ICD-10-CM | POA: Diagnosis not present

## 2021-06-23 DIAGNOSIS — R0602 Shortness of breath: Secondary | ICD-10-CM | POA: Diagnosis not present

## 2021-07-05 NOTE — Telephone Encounter (Signed)
Copied from [place] [phone]. Topic: General - Deceased Patient >> 07/05/21  1:16 PM Alanda Slim E wrote: Reason for CRM: [name] called to let Dr. Rosanna Randy know that Kenitha passed away last night and [facility] has her body

## 2021-07-05 DEATH — deceased
